# Patient Record
Sex: Male | Born: 1945
Health system: Southern US, Community
[De-identification: ages and names within clinical notes are randomized; demographics above are authoritative.]

## PROBLEM LIST (undated history)

## (undated) DIAGNOSIS — E7800 Pure hypercholesterolemia, unspecified: Secondary | ICD-10-CM

## (undated) DIAGNOSIS — F329 Major depressive disorder, single episode, unspecified: Secondary | ICD-10-CM

## (undated) DIAGNOSIS — F32A Depression, unspecified: Secondary | ICD-10-CM

## (undated) DIAGNOSIS — I509 Heart failure, unspecified: Secondary | ICD-10-CM

## (undated) DIAGNOSIS — E0500 Thyrotoxicosis with diffuse goiter without thyrotoxic crisis or storm: Secondary | ICD-10-CM

## (undated) DIAGNOSIS — C8310 Mantle cell lymphoma, unspecified site: Secondary | ICD-10-CM

## (undated) DIAGNOSIS — K3184 Gastroparesis: Secondary | ICD-10-CM

## (undated) DIAGNOSIS — G629 Polyneuropathy, unspecified: Secondary | ICD-10-CM

## (undated) DIAGNOSIS — B029 Zoster without complications: Secondary | ICD-10-CM

## (undated) DIAGNOSIS — E08311 Diabetes mellitus due to underlying condition with unspecified diabetic retinopathy with macular edema: Secondary | ICD-10-CM

## (undated) DIAGNOSIS — I10 Essential (primary) hypertension: Secondary | ICD-10-CM

## (undated) HISTORY — PX: NEPHRECTOMY: SHX65

## (undated) HISTORY — DX: Major depressive disorder, single episode, unspecified: F32.9

## (undated) HISTORY — PX: CHOLECYSTECTOMY: SHX55

## (undated) HISTORY — DX: Polyneuropathy, unspecified: G62.9

## (undated) HISTORY — DX: Mantle cell lymphoma, unspecified site: C83.10

## (undated) HISTORY — DX: Diabetes mellitus due to underlying condition with unspecified diabetic retinopathy with macular edema: E08.311

## (undated) HISTORY — DX: Zoster without complications: B02.9

## (undated) HISTORY — DX: Gastroparesis: K31.84

## (undated) HISTORY — DX: Depression, unspecified: F32.A

## (undated) HISTORY — DX: Heart failure, unspecified: I50.9

## (undated) HISTORY — DX: Pure hypercholesterolemia, unspecified: E78.00

## (undated) HISTORY — DX: Thyrotoxicosis with diffuse goiter without thyrotoxic crisis or storm: E05.00

## (undated) HISTORY — DX: Essential (primary) hypertension: I10

## (undated) MED FILL — Dexamethasone Sodium Phosphate Inj 100 MG/10ML: INTRAMUSCULAR | Qty: 1 | Status: AC

---

## 1999-10-13 DIAGNOSIS — K5900 Constipation, unspecified: Secondary | ICD-10-CM | POA: Insufficient documentation

## 1999-10-13 DIAGNOSIS — I2510 Atherosclerotic heart disease of native coronary artery without angina pectoris: Secondary | ICD-10-CM | POA: Insufficient documentation

## 1999-10-13 DIAGNOSIS — H04123 Dry eye syndrome of bilateral lacrimal glands: Secondary | ICD-10-CM | POA: Insufficient documentation

## 1999-10-13 DIAGNOSIS — F32A Depression, unspecified: Secondary | ICD-10-CM | POA: Insufficient documentation

## 1999-10-13 DIAGNOSIS — I872 Venous insufficiency (chronic) (peripheral): Secondary | ICD-10-CM | POA: Insufficient documentation

## 2004-05-29 ENCOUNTER — Ambulatory Visit (HOSPITAL_COMMUNITY): Admission: RE | Admit: 2004-05-29 | Discharge: 2004-05-29 | Payer: Self-pay | Admitting: Cardiovascular Disease

## 2004-09-10 ENCOUNTER — Ambulatory Visit: Payer: Self-pay | Admitting: Internal Medicine

## 2007-08-17 ENCOUNTER — Ambulatory Visit: Payer: Self-pay | Admitting: Unknown Physician Specialty

## 2011-11-20 DIAGNOSIS — E1129 Type 2 diabetes mellitus with other diabetic kidney complication: Secondary | ICD-10-CM | POA: Diagnosis not present

## 2011-11-20 DIAGNOSIS — E162 Hypoglycemia, unspecified: Secondary | ICD-10-CM | POA: Diagnosis not present

## 2011-11-20 DIAGNOSIS — E119 Type 2 diabetes mellitus without complications: Secondary | ICD-10-CM | POA: Diagnosis not present

## 2011-11-24 DIAGNOSIS — E0500 Thyrotoxicosis with diffuse goiter without thyrotoxic crisis or storm: Secondary | ICD-10-CM | POA: Diagnosis not present

## 2011-11-24 DIAGNOSIS — E162 Hypoglycemia, unspecified: Secondary | ICD-10-CM | POA: Diagnosis not present

## 2011-11-24 DIAGNOSIS — E109 Type 1 diabetes mellitus without complications: Secondary | ICD-10-CM | POA: Diagnosis not present

## 2011-12-25 DIAGNOSIS — I10 Essential (primary) hypertension: Secondary | ICD-10-CM | POA: Diagnosis not present

## 2011-12-25 DIAGNOSIS — E039 Hypothyroidism, unspecified: Secondary | ICD-10-CM | POA: Diagnosis not present

## 2011-12-25 DIAGNOSIS — E7800 Pure hypercholesterolemia, unspecified: Secondary | ICD-10-CM | POA: Diagnosis not present

## 2011-12-25 DIAGNOSIS — E119 Type 2 diabetes mellitus without complications: Secondary | ICD-10-CM | POA: Diagnosis not present

## 2012-02-12 DIAGNOSIS — Z79899 Other long term (current) drug therapy: Secondary | ICD-10-CM | POA: Diagnosis not present

## 2012-02-12 DIAGNOSIS — E109 Type 1 diabetes mellitus without complications: Secondary | ICD-10-CM | POA: Diagnosis not present

## 2012-02-12 DIAGNOSIS — Z125 Encounter for screening for malignant neoplasm of prostate: Secondary | ICD-10-CM | POA: Diagnosis not present

## 2012-02-12 DIAGNOSIS — E7800 Pure hypercholesterolemia, unspecified: Secondary | ICD-10-CM | POA: Diagnosis not present

## 2012-02-16 DIAGNOSIS — E0500 Thyrotoxicosis with diffuse goiter without thyrotoxic crisis or storm: Secondary | ICD-10-CM | POA: Diagnosis not present

## 2012-02-16 DIAGNOSIS — E162 Hypoglycemia, unspecified: Secondary | ICD-10-CM | POA: Diagnosis not present

## 2012-02-16 DIAGNOSIS — E7800 Pure hypercholesterolemia, unspecified: Secondary | ICD-10-CM | POA: Diagnosis not present

## 2012-02-16 DIAGNOSIS — E1065 Type 1 diabetes mellitus with hyperglycemia: Secondary | ICD-10-CM | POA: Diagnosis not present

## 2012-05-04 DIAGNOSIS — I10 Essential (primary) hypertension: Secondary | ICD-10-CM | POA: Diagnosis not present

## 2012-05-04 DIAGNOSIS — E7800 Pure hypercholesterolemia, unspecified: Secondary | ICD-10-CM | POA: Diagnosis not present

## 2012-05-04 DIAGNOSIS — E119 Type 2 diabetes mellitus without complications: Secondary | ICD-10-CM | POA: Diagnosis not present

## 2012-05-27 DIAGNOSIS — IMO0002 Reserved for concepts with insufficient information to code with codable children: Secondary | ICD-10-CM | POA: Diagnosis not present

## 2012-05-27 DIAGNOSIS — E1065 Type 1 diabetes mellitus with hyperglycemia: Secondary | ICD-10-CM | POA: Diagnosis not present

## 2012-05-27 DIAGNOSIS — E162 Hypoglycemia, unspecified: Secondary | ICD-10-CM | POA: Diagnosis not present

## 2012-05-31 DIAGNOSIS — E109 Type 1 diabetes mellitus without complications: Secondary | ICD-10-CM | POA: Diagnosis not present

## 2012-05-31 DIAGNOSIS — E0500 Thyrotoxicosis with diffuse goiter without thyrotoxic crisis or storm: Secondary | ICD-10-CM | POA: Diagnosis not present

## 2012-05-31 DIAGNOSIS — E162 Hypoglycemia, unspecified: Secondary | ICD-10-CM | POA: Diagnosis not present

## 2012-07-19 DIAGNOSIS — E109 Type 1 diabetes mellitus without complications: Secondary | ICD-10-CM | POA: Diagnosis not present

## 2012-09-02 DIAGNOSIS — E0500 Thyrotoxicosis with diffuse goiter without thyrotoxic crisis or storm: Secondary | ICD-10-CM | POA: Diagnosis not present

## 2012-09-02 DIAGNOSIS — E109 Type 1 diabetes mellitus without complications: Secondary | ICD-10-CM | POA: Diagnosis not present

## 2012-09-06 DIAGNOSIS — E109 Type 1 diabetes mellitus without complications: Secondary | ICD-10-CM | POA: Diagnosis not present

## 2012-09-06 DIAGNOSIS — E0500 Thyrotoxicosis with diffuse goiter without thyrotoxic crisis or storm: Secondary | ICD-10-CM | POA: Diagnosis not present

## 2012-09-06 DIAGNOSIS — E162 Hypoglycemia, unspecified: Secondary | ICD-10-CM | POA: Diagnosis not present

## 2012-12-06 DIAGNOSIS — E0500 Thyrotoxicosis with diffuse goiter without thyrotoxic crisis or storm: Secondary | ICD-10-CM | POA: Diagnosis not present

## 2012-12-06 DIAGNOSIS — E109 Type 1 diabetes mellitus without complications: Secondary | ICD-10-CM | POA: Diagnosis not present

## 2012-12-13 DIAGNOSIS — E109 Type 1 diabetes mellitus without complications: Secondary | ICD-10-CM | POA: Diagnosis not present

## 2012-12-13 DIAGNOSIS — E0500 Thyrotoxicosis with diffuse goiter without thyrotoxic crisis or storm: Secondary | ICD-10-CM | POA: Diagnosis not present

## 2013-03-14 DIAGNOSIS — E119 Type 2 diabetes mellitus without complications: Secondary | ICD-10-CM | POA: Diagnosis not present

## 2013-03-14 DIAGNOSIS — E162 Hypoglycemia, unspecified: Secondary | ICD-10-CM | POA: Diagnosis not present

## 2013-03-21 DIAGNOSIS — E7800 Pure hypercholesterolemia, unspecified: Secondary | ICD-10-CM | POA: Diagnosis not present

## 2013-03-21 DIAGNOSIS — E0500 Thyrotoxicosis with diffuse goiter without thyrotoxic crisis or storm: Secondary | ICD-10-CM | POA: Diagnosis not present

## 2013-03-21 DIAGNOSIS — E162 Hypoglycemia, unspecified: Secondary | ICD-10-CM | POA: Diagnosis not present

## 2013-03-21 DIAGNOSIS — E109 Type 1 diabetes mellitus without complications: Secondary | ICD-10-CM | POA: Diagnosis not present

## 2013-04-12 ENCOUNTER — Encounter: Payer: Self-pay | Admitting: Internal Medicine

## 2013-04-12 ENCOUNTER — Ambulatory Visit (INDEPENDENT_AMBULATORY_CARE_PROVIDER_SITE_OTHER): Payer: Medicare Other | Admitting: Internal Medicine

## 2013-04-12 ENCOUNTER — Other Ambulatory Visit: Payer: Self-pay | Admitting: *Deleted

## 2013-04-12 VITALS — BP 130/60 | HR 90 | Temp 98.2°F | Ht 70.25 in | Wt 223.8 lb

## 2013-04-12 DIAGNOSIS — I10 Essential (primary) hypertension: Secondary | ICD-10-CM

## 2013-04-12 DIAGNOSIS — E079 Disorder of thyroid, unspecified: Secondary | ICD-10-CM | POA: Diagnosis not present

## 2013-04-12 DIAGNOSIS — R079 Chest pain, unspecified: Secondary | ICD-10-CM

## 2013-04-12 DIAGNOSIS — R109 Unspecified abdominal pain: Secondary | ICD-10-CM

## 2013-04-12 DIAGNOSIS — E119 Type 2 diabetes mellitus without complications: Secondary | ICD-10-CM | POA: Diagnosis not present

## 2013-04-12 DIAGNOSIS — E7800 Pure hypercholesterolemia, unspecified: Secondary | ICD-10-CM

## 2013-04-12 MED ORDER — PRAVASTATIN SODIUM 40 MG PO TABS: 40.0000 mg | ORAL_TABLET | Freq: Every day | ORAL | Status: DC

## 2013-04-12 MED ORDER — PANTOPRAZOLE SODIUM 40 MG PO TBEC: 40.0000 mg | DELAYED_RELEASE_TABLET | Freq: Every day | ORAL | Status: DC

## 2013-04-12 MED ORDER — INSULIN ASPART 100 UNIT/ML ~~LOC~~ SOLN: SUBCUTANEOUS | Status: DC

## 2013-04-12 MED ORDER — METOCLOPRAMIDE HCL 10 MG PO TABS: 10.0000 mg | ORAL_TABLET | Freq: Every day | ORAL | Status: DC

## 2013-04-12 MED ORDER — LISINOPRIL 20 MG PO TABS: 20.0000 mg | ORAL_TABLET | Freq: Every day | ORAL | Status: DC

## 2013-04-13 ENCOUNTER — Encounter: Payer: Self-pay | Admitting: Internal Medicine

## 2013-04-13 DIAGNOSIS — R079 Chest pain, unspecified: Secondary | ICD-10-CM | POA: Insufficient documentation

## 2013-04-13 DIAGNOSIS — E7800 Pure hypercholesterolemia, unspecified: Secondary | ICD-10-CM | POA: Insufficient documentation

## 2013-04-13 DIAGNOSIS — E119 Type 2 diabetes mellitus without complications: Secondary | ICD-10-CM | POA: Insufficient documentation

## 2013-04-13 DIAGNOSIS — I10 Essential (primary) hypertension: Secondary | ICD-10-CM | POA: Insufficient documentation

## 2013-04-13 DIAGNOSIS — E079 Disorder of thyroid, unspecified: Secondary | ICD-10-CM | POA: Insufficient documentation

## 2013-04-13 NOTE — Progress Notes (Signed)
  Subjective:    [name], male    DOB: [birth year], 67 y.o.   MRN: [id number]  HPI 67 year old male with past history of diabetes, hypertension, hypercholesterolemia and thyroid disease.  He is followed by Dr Eddie Dibbles for his diabetes and thyroid issues.  He now has an insulin pump.  Not having as much problems with lows.  He dose report that starting approximately one week ago he developed stomach, chest and back pain.  Decreased appetite through this episode.  He reports he had been out of Reglan for 6 months.  He feels this is what caused the flare.  During the time off the medication, he had no problems swallowing, nausea or bowel change.  With this flare, did notice increased acid/burning and discomfort in his chest.  Was not evaluated.  Reports significant pain.  States he found some reglan at home and started taking the medication several days ago.  Pain has essentially subsided now.  No increased cough or congestion.    Past Medical History  Diagnosis Date  . Thyroid disease   . Hypertension   . Diabetes   . Hypercholesterolemia     Outpatient Encounter Prescriptions as of 04/12/2013  Medication Sig Dispense Refill  . methimazole (TAPAZOLE) 10 MG tablet Take 20 mg by mouth 2 (two) times daily.      . pantoprazole (PROTONIX) 40 MG tablet Take 1 tablet (40 mg total) by mouth daily.  30 tablet  3  . [DISCONTINUED] Insulin Aspart (NOVOLOG [place]) Inject into the skin. Pump      . [DISCONTINUED] lisinopril (PRINIVIL,ZESTRIL) 20 MG tablet Take 20 mg by mouth at bedtime.      . [DISCONTINUED] metoCLOPramide (REGLAN) 10 MG tablet Take 10 mg by mouth at bedtime.      . [DISCONTINUED] pravastatin (PRAVACHOL) 40 MG tablet Take 40 mg by mouth at bedtime.       No facility-administered encounter medications on file as of 04/12/2013.    Review of Systems Patient denies any headache, lightheadedness or dizziness.  No sinus or allergy symptoms.  No palpitations.  Does report the chest  discomfort associated with abdominal pain and back pain as outlined.  No increased shortness of breath, cough or congestion.  No vomiting.  Does report decreased appetite.  No BRBPR or melana.  Does report some constipation.  No urine change.        Objective:   Physical Exam Filed Vitals:   04/12/13 1326  BP: 130/60  Pulse: 90  Temp: 98.2 F (36.8 C)   Blood pressure recheck:  120/68, pulse 43  67 year old male in no acute distress.  HEENT:  Nares - clear.  Oropharynx - without lesions. NECK:  Supple.  Nontender.  No audible carotid bruit.  HEART:  Appears to be regular.   LUNGS:  No crackles or wheezing audible.  Respirations even and unlabored.   RADIAL PULSE:  Equal bilaterally.  ABDOMEN:  Soft.  Nontender.  Bowel sounds present and normal.  No audible abdominal bruit.   EXTREMITIES:  No increased edema present.  DP pulses palpable and equal bilaterally.      Assessment & Plan:  HEALTH MAINTENANCE.  Schedule a physical next visit.  Obtain outside records.

## 2013-04-13 NOTE — Assessment & Plan Note (Signed)
Chest pain as outlined.  EKG obtained and revealed SR with RBBB with TWI in III and aVF.  Given his risk factors, I do feel he warrants further cardiac evaluation.  Will refer back to Dr Nehemiah Massed for evaluation and question of need for further w/up.  Will also start him on Protonix and obtain an abdominal ultrasound.  Will hold Reglan for now.  Follow closely.

## 2013-04-13 NOTE — Assessment & Plan Note (Signed)
On pravastatin.  Follow lipid panel and liver function.

## 2013-04-13 NOTE — Assessment & Plan Note (Signed)
Blood pressure doing well.  Continue current medication regimen.  Follow metabolic panel.

## 2013-04-13 NOTE — Assessment & Plan Note (Signed)
Has an insulin pump now.  Sees Dr Eddie Dibbles.  States does not have significant problems with lows now.  Did have a sugar of 49 here today.  Drank a soft drink.  Blood sugar increased to 100.  He was also given some juice and crackers.  Felt better.  Obtain records.

## 2013-04-13 NOTE — Assessment & Plan Note (Signed)
Followed by Dr Eddie Dibbles.  Obtain records.  On Tapazole.

## 2013-04-17 ENCOUNTER — Ambulatory Visit: Payer: Self-pay | Admitting: Internal Medicine

## 2013-04-17 DIAGNOSIS — R109 Unspecified abdominal pain: Secondary | ICD-10-CM | POA: Diagnosis not present

## 2013-04-18 ENCOUNTER — Encounter: Payer: Self-pay | Admitting: Internal Medicine

## 2013-04-18 DIAGNOSIS — I2510 Atherosclerotic heart disease of native coronary artery without angina pectoris: Secondary | ICD-10-CM | POA: Diagnosis not present

## 2013-04-18 DIAGNOSIS — R1084 Generalized abdominal pain: Secondary | ICD-10-CM | POA: Diagnosis not present

## 2013-04-18 DIAGNOSIS — R079 Chest pain, unspecified: Secondary | ICD-10-CM | POA: Diagnosis not present

## 2013-04-18 DIAGNOSIS — I10 Essential (primary) hypertension: Secondary | ICD-10-CM | POA: Diagnosis not present

## 2013-04-20 ENCOUNTER — Telehealth: Payer: Self-pay | Admitting: *Deleted

## 2013-04-20 NOTE — Telephone Encounter (Signed)
Notified pt that his abdominal ultrasound from 04/17/13 revealed no acute abnormalities. Some changes c/w fatty liver. Pt reported that she has seen her cardiologist & has a GI appt on 8/14 (report faxed to [facility]).

## 2013-04-28 ENCOUNTER — Encounter: Payer: Self-pay | Admitting: Internal Medicine

## 2013-05-08 DIAGNOSIS — I2510 Atherosclerotic heart disease of native coronary artery without angina pectoris: Secondary | ICD-10-CM | POA: Diagnosis not present

## 2013-05-08 DIAGNOSIS — E119 Type 2 diabetes mellitus without complications: Secondary | ICD-10-CM | POA: Diagnosis not present

## 2013-05-08 DIAGNOSIS — E782 Mixed hyperlipidemia: Secondary | ICD-10-CM | POA: Diagnosis not present

## 2013-05-08 DIAGNOSIS — I119 Hypertensive heart disease without heart failure: Secondary | ICD-10-CM | POA: Diagnosis not present

## 2013-05-17 ENCOUNTER — Encounter: Payer: Self-pay | Admitting: *Deleted

## 2013-05-29 DIAGNOSIS — K219 Gastro-esophageal reflux disease without esophagitis: Secondary | ICD-10-CM | POA: Diagnosis not present

## 2013-05-29 DIAGNOSIS — Z86010 Personal history of colonic polyps: Secondary | ICD-10-CM | POA: Diagnosis not present

## 2013-05-29 DIAGNOSIS — K3184 Gastroparesis: Secondary | ICD-10-CM | POA: Diagnosis not present

## 2013-05-31 ENCOUNTER — Encounter: Payer: Self-pay | Admitting: *Deleted

## 2013-06-20 ENCOUNTER — Ambulatory Visit (INDEPENDENT_AMBULATORY_CARE_PROVIDER_SITE_OTHER): Payer: Medicare Other | Admitting: Internal Medicine

## 2013-06-20 ENCOUNTER — Encounter: Payer: Self-pay | Admitting: Internal Medicine

## 2013-06-20 VITALS — BP 110/80 | HR 71 | Temp 98.4°F | Ht 70.25 in | Wt 238.5 lb

## 2013-06-20 DIAGNOSIS — E7800 Pure hypercholesterolemia, unspecified: Secondary | ICD-10-CM

## 2013-06-20 DIAGNOSIS — R079 Chest pain, unspecified: Secondary | ICD-10-CM

## 2013-06-20 DIAGNOSIS — I10 Essential (primary) hypertension: Secondary | ICD-10-CM

## 2013-06-20 DIAGNOSIS — E119 Type 2 diabetes mellitus without complications: Secondary | ICD-10-CM

## 2013-06-20 DIAGNOSIS — Z23 Encounter for immunization: Secondary | ICD-10-CM | POA: Diagnosis not present

## 2013-06-20 DIAGNOSIS — K3184 Gastroparesis: Secondary | ICD-10-CM

## 2013-06-20 DIAGNOSIS — E079 Disorder of thyroid, unspecified: Secondary | ICD-10-CM

## 2013-06-25 ENCOUNTER — Encounter: Payer: Self-pay | Admitting: Internal Medicine

## 2013-06-25 DIAGNOSIS — K3184 Gastroparesis: Secondary | ICD-10-CM | POA: Insufficient documentation

## 2013-06-25 MED ORDER — LISINOPRIL 20 MG PO TABS: 20.0000 mg | ORAL_TABLET | Freq: Every day | ORAL | Status: DC

## 2013-06-25 MED ORDER — PANTOPRAZOLE SODIUM 40 MG PO TBEC: 40.0000 mg | DELAYED_RELEASE_TABLET | Freq: Every day | ORAL | Status: DC

## 2013-06-25 MED ORDER — PRAVASTATIN SODIUM 40 MG PO TABS: 40.0000 mg | ORAL_TABLET | Freq: Every day | ORAL | Status: DC

## 2013-06-25 MED ORDER — METOCLOPRAMIDE HCL 10 MG PO TABS: 10.0000 mg | ORAL_TABLET | Freq: Every day | ORAL | Status: DC

## 2013-06-25 NOTE — Assessment & Plan Note (Signed)
Followed by Dr Eddie Dibbles.  On Tapazole.

## 2013-06-25 NOTE — Progress Notes (Signed)
Subjective:    [name], male    DOB: 08-Dec-1945, 67 y.o.   MRN: [id number]  HPI 67 year old male with past history of diabetes, hypertension, hypercholesterolemia and thyroid disease.  He is followed by Dr Eddie Dibbles for his diabetes and thyroid issues.  He now has an insulin pump.  Not having as much problems with lows.  He comes in today to follow up on these issues as well as for a complete physical exam.  He states he is doing relatively well.  No chest pain or tightness.  Saw cardiology recently.  States everything checked out fine.  Also saw GI.  They had wanted to repeat a colonoscopy and an EGD.  He declined at that time.  They recommend him staying on his current regimen.  Since being on the reglan and protonix, he has done well.  No further pain.  Breathing stable. No acid reflux.  Bowels stable.  Overall doing relatively well.      Past Medical History  Diagnosis Date  . Peripheral neuropathy   . Hypertension   . 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 Hypercholesterolemia   . Gastroparesis   . Graves disease   . Depression     Outpatient Encounter Prescriptions as of 06/20/2013  Medication Sig Dispense Refill  . insulin aspart (NOVOLOG) 100 UNIT/ML injection Use as directed  1 vial  12  . lisinopril (PRINIVIL,ZESTRIL) 20 MG tablet Take 1 tablet (20 mg total) by mouth at bedtime.  30 tablet  5  . methimazole (TAPAZOLE) 10 MG tablet Take 20 mg by mouth 2 (two) times daily.      . metoCLOPramide (REGLAN) 10 MG tablet Take 1 tablet (10 mg total) by mouth at bedtime.  30 tablet  5  . pantoprazole (PROTONIX) 40 MG tablet Take 1 tablet (40 mg total) by mouth daily.  30 tablet  3  . pravastatin (PRAVACHOL) 40 MG tablet Take 1 tablet (40 mg total) by mouth at bedtime.  30 tablet  5   No facility-administered encounter medications on file as of 06/20/2013.    Review of Systems Patient denies any headache, lightheadedness  or dizziness.  No sinus or allergy symptoms.  No palpitations.  No chest pain or tightness.  No increased shortness of breath, cough or congestion.  No vomiting.  No acid reflux.   No BRBPR or melana.  No bowel change.   No urine change.  Not having problems with lows (as he was previously).  Overall he feels things are stable and feels better than on previous visit.       Objective:   Physical Exam  Filed Vitals:   06/20/13 1554  BP: 110/80  Pulse: 71  Temp: 98.4 F (36.9 C)   Blood pressure recheck:  122/72, pulse 45  67 year old male in no acute distress.  HEENT:  Nares - clear.  Oropharynx - without lesions. NECK:  Supple.  Nontender.  No audible carotid bruit.  HEART:  Appears to be regular.   LUNGS:  No crackles or wheezing audible.  Respirations even and unlabored.   RADIAL PULSE:  Equal bilaterally.  ABDOMEN:  Soft.  Nontender.  Bowel sounds present and normal.  No audible abdominal bruit.  GU:  Normal descended testicles.  No palpable testicular nodules.   RECTAL:  Could not appreciate any palpable prostate nodules.  Heme negative.   EXTREMITIES:  No increased edema present.  DP pulses palpable and equal bilaterally.     FEET:  Without lesions.       Assessment & Plan:  HEALTH MAINTENANCE.  Physical today.  Saw GI.  Planning to for colonoscopy and EGD in the future.  He declines to have done now.  They are planning to follow up with him in the near future.

## 2013-06-25 NOTE — Assessment & Plan Note (Signed)
Saw cardiology.  Felt things were stable.  No further chest pain since being on reglan and protonix.  Doing well.  Follow.

## 2013-06-25 NOTE — Assessment & Plan Note (Signed)
On pravastatin.  Follow lipid panel and liver function.

## 2013-06-25 NOTE — Assessment & Plan Note (Signed)
Has an insulin pump now.  Sees Dr Eddie Dibbles.  States does not have significant problems with lows now.  Doing better.

## 2013-06-25 NOTE — Assessment & Plan Note (Signed)
On reglan and protonix now.  Currently asymptomatic.  Follow.

## 2013-06-25 NOTE — Assessment & Plan Note (Signed)
Blood pressure doing well.  Continue current medication regimen.  Follow metabolic panel.

## 2013-06-30 DIAGNOSIS — H25019 Cortical age-related cataract, unspecified eye: Secondary | ICD-10-CM | POA: Diagnosis not present

## 2013-06-30 DIAGNOSIS — H2510 Age-related nuclear cataract, unspecified eye: Secondary | ICD-10-CM | POA: Diagnosis not present

## 2013-06-30 DIAGNOSIS — E119 Type 2 diabetes mellitus without complications: Secondary | ICD-10-CM | POA: Diagnosis not present

## 2013-07-04 DIAGNOSIS — H2510 Age-related nuclear cataract, unspecified eye: Secondary | ICD-10-CM | POA: Diagnosis not present

## 2013-07-26 DIAGNOSIS — H25019 Cortical age-related cataract, unspecified eye: Secondary | ICD-10-CM | POA: Diagnosis not present

## 2013-08-07 ENCOUNTER — Ambulatory Visit: Payer: Self-pay | Admitting: Ophthalmology

## 2013-08-07 DIAGNOSIS — E119 Type 2 diabetes mellitus without complications: Secondary | ICD-10-CM | POA: Diagnosis not present

## 2013-08-07 DIAGNOSIS — I10 Essential (primary) hypertension: Secondary | ICD-10-CM | POA: Diagnosis not present

## 2013-08-07 DIAGNOSIS — H25019 Cortical age-related cataract, unspecified eye: Secondary | ICD-10-CM | POA: Diagnosis not present

## 2013-08-07 DIAGNOSIS — Z905 Acquired absence of kidney: Secondary | ICD-10-CM | POA: Diagnosis not present

## 2013-08-07 DIAGNOSIS — E079 Disorder of thyroid, unspecified: Secondary | ICD-10-CM | POA: Diagnosis not present

## 2013-08-07 DIAGNOSIS — Z79899 Other long term (current) drug therapy: Secondary | ICD-10-CM | POA: Diagnosis not present

## 2013-08-07 DIAGNOSIS — K219 Gastro-esophageal reflux disease without esophagitis: Secondary | ICD-10-CM | POA: Diagnosis not present

## 2013-08-07 DIAGNOSIS — Z87891 Personal history of nicotine dependence: Secondary | ICD-10-CM | POA: Diagnosis not present

## 2013-08-07 DIAGNOSIS — H269 Unspecified cataract: Secondary | ICD-10-CM | POA: Diagnosis not present

## 2013-08-07 DIAGNOSIS — F329 Major depressive disorder, single episode, unspecified: Secondary | ICD-10-CM | POA: Diagnosis not present

## 2013-08-07 DIAGNOSIS — E7800 Pure hypercholesterolemia, unspecified: Secondary | ICD-10-CM | POA: Diagnosis not present

## 2013-08-07 DIAGNOSIS — Z794 Long term (current) use of insulin: Secondary | ICD-10-CM | POA: Diagnosis not present

## 2013-08-28 DIAGNOSIS — E119 Type 2 diabetes mellitus without complications: Secondary | ICD-10-CM | POA: Diagnosis not present

## 2013-08-28 DIAGNOSIS — E785 Hyperlipidemia, unspecified: Secondary | ICD-10-CM | POA: Diagnosis not present

## 2013-08-28 DIAGNOSIS — I10 Essential (primary) hypertension: Secondary | ICD-10-CM | POA: Diagnosis not present

## 2013-08-28 DIAGNOSIS — I2510 Atherosclerotic heart disease of native coronary artery without angina pectoris: Secondary | ICD-10-CM | POA: Diagnosis not present

## 2013-09-05 DIAGNOSIS — H2510 Age-related nuclear cataract, unspecified eye: Secondary | ICD-10-CM | POA: Diagnosis not present

## 2013-09-15 ENCOUNTER — Emergency Department: Payer: Self-pay | Admitting: Emergency Medicine

## 2013-09-15 DIAGNOSIS — R079 Chest pain, unspecified: Secondary | ICD-10-CM | POA: Diagnosis not present

## 2013-09-15 DIAGNOSIS — E109 Type 1 diabetes mellitus without complications: Secondary | ICD-10-CM | POA: Diagnosis not present

## 2013-09-15 DIAGNOSIS — Z794 Long term (current) use of insulin: Secondary | ICD-10-CM | POA: Diagnosis not present

## 2013-09-15 DIAGNOSIS — E785 Hyperlipidemia, unspecified: Secondary | ICD-10-CM | POA: Diagnosis not present

## 2013-09-15 DIAGNOSIS — E039 Hypothyroidism, unspecified: Secondary | ICD-10-CM | POA: Diagnosis not present

## 2013-09-15 DIAGNOSIS — H4310 Vitreous hemorrhage, unspecified eye: Secondary | ICD-10-CM | POA: Diagnosis not present

## 2013-09-15 DIAGNOSIS — I10 Essential (primary) hypertension: Secondary | ICD-10-CM | POA: Diagnosis not present

## 2013-09-15 DIAGNOSIS — R0602 Shortness of breath: Secondary | ICD-10-CM | POA: Diagnosis not present

## 2013-09-15 LAB — CBC
HCT: 46.3 % (ref 40.0–52.0)
MCV: 90 fL (ref 80–100)
Platelet: 177 10*3/uL (ref 150–440)
RBC: 5.15 10*6/uL (ref 4.40–5.90)
WBC: 9.3 10*3/uL (ref 3.8–10.6)

## 2013-09-15 LAB — BASIC METABOLIC PANEL
Anion Gap: 4 — ABNORMAL LOW (ref 7–16)
BUN: 7 mg/dL (ref 7–18)
Calcium, Total: 8.4 mg/dL — ABNORMAL LOW (ref 8.5–10.1)
Creatinine: 1.04 mg/dL (ref 0.60–1.30)
EGFR (Non-African Amer.): >60
Glucose: 293 mg/dL — ABNORMAL HIGH (ref 65–99)
Osmolality: 288 (ref 275–301)
Potassium: 3.7 mmol/L (ref 3.5–5.1)
Sodium: 140 mmol/L (ref 136–145)

## 2013-09-15 LAB — PROTIME-INR: INR: 1

## 2013-09-15 LAB — TROPONIN I
Troponin-I: <0.02 ng/mL
Troponin-I: <0.02 ng/mL

## 2013-09-15 LAB — CK TOTAL AND CKMB (NOT AT ARMC): CK, Total: 106 U/L (ref 35–232)

## 2013-09-18 ENCOUNTER — Ambulatory Visit: Payer: Self-pay | Admitting: Ophthalmology

## 2013-09-18 DIAGNOSIS — H2510 Age-related nuclear cataract, unspecified eye: Secondary | ICD-10-CM | POA: Diagnosis not present

## 2013-09-18 DIAGNOSIS — I10 Essential (primary) hypertension: Secondary | ICD-10-CM | POA: Diagnosis not present

## 2013-09-18 DIAGNOSIS — Z905 Acquired absence of kidney: Secondary | ICD-10-CM | POA: Diagnosis not present

## 2013-09-18 DIAGNOSIS — E7800 Pure hypercholesterolemia, unspecified: Secondary | ICD-10-CM | POA: Diagnosis not present

## 2013-09-18 DIAGNOSIS — F329 Major depressive disorder, single episode, unspecified: Secondary | ICD-10-CM | POA: Diagnosis not present

## 2013-09-18 DIAGNOSIS — E119 Type 2 diabetes mellitus without complications: Secondary | ICD-10-CM | POA: Diagnosis not present

## 2013-09-18 DIAGNOSIS — K219 Gastro-esophageal reflux disease without esophagitis: Secondary | ICD-10-CM | POA: Diagnosis not present

## 2013-09-18 DIAGNOSIS — Z79899 Other long term (current) drug therapy: Secondary | ICD-10-CM | POA: Diagnosis not present

## 2013-09-18 DIAGNOSIS — H269 Unspecified cataract: Secondary | ICD-10-CM | POA: Diagnosis not present

## 2013-09-18 DIAGNOSIS — Z87891 Personal history of nicotine dependence: Secondary | ICD-10-CM | POA: Diagnosis not present

## 2013-09-18 DIAGNOSIS — Z794 Long term (current) use of insulin: Secondary | ICD-10-CM | POA: Diagnosis not present

## 2013-09-19 DIAGNOSIS — E109 Type 1 diabetes mellitus without complications: Secondary | ICD-10-CM | POA: Diagnosis not present

## 2013-09-19 DIAGNOSIS — E7800 Pure hypercholesterolemia, unspecified: Secondary | ICD-10-CM | POA: Diagnosis not present

## 2013-09-19 LAB — HEMOGLOBIN A1C: Hgb A1c MFr Bld: 6 % (ref 4.0–6.0)

## 2013-09-26 DIAGNOSIS — E7800 Pure hypercholesterolemia, unspecified: Secondary | ICD-10-CM | POA: Diagnosis not present

## 2013-09-26 DIAGNOSIS — E109 Type 1 diabetes mellitus without complications: Secondary | ICD-10-CM | POA: Diagnosis not present

## 2013-09-26 DIAGNOSIS — E0500 Thyrotoxicosis with diffuse goiter without thyrotoxic crisis or storm: Secondary | ICD-10-CM | POA: Diagnosis not present

## 2013-10-13 ENCOUNTER — Encounter (INDEPENDENT_AMBULATORY_CARE_PROVIDER_SITE_OTHER): Payer: Self-pay

## 2013-10-13 ENCOUNTER — Other Ambulatory Visit (INDEPENDENT_AMBULATORY_CARE_PROVIDER_SITE_OTHER): Payer: Medicare Other

## 2013-10-13 ENCOUNTER — Other Ambulatory Visit: Payer: Self-pay | Admitting: Internal Medicine

## 2013-10-13 ENCOUNTER — Telehealth: Payer: Self-pay | Admitting: Internal Medicine

## 2013-10-13 DIAGNOSIS — E079 Disorder of thyroid, unspecified: Secondary | ICD-10-CM

## 2013-10-13 DIAGNOSIS — E7800 Pure hypercholesterolemia, unspecified: Secondary | ICD-10-CM

## 2013-10-13 DIAGNOSIS — E119 Type 2 diabetes mellitus without complications: Secondary | ICD-10-CM

## 2013-10-13 LAB — CBC WITH DIFFERENTIAL/PLATELET
Basophils Absolute: 0.1 10*3/uL (ref 0.0–0.1)
Basophils Relative: 0.9 % (ref 0.0–3.0)
Eosinophils Absolute: 0.2 10*3/uL (ref 0.0–0.7)
Eosinophils Relative: 3.5 % (ref 0.0–5.0)
HCT: 44.5 % (ref 39.0–52.0)
HEMOGLOBIN: 15.4 g/dL (ref 13.0–17.0)
LYMPHS ABS: 1.7 10*3/uL (ref 0.7–4.0)
LYMPHS PCT: 26.9 % (ref 12.0–46.0)
MCHC: 34.5 g/dL (ref 30.0–36.0)
MCV: 89.4 fl (ref 78.0–100.0)
MONOS PCT: 8.2 % (ref 3.0–12.0)
Monocytes Absolute: 0.5 10*3/uL (ref 0.1–1.0)
NEUTROS PCT: 60.5 % (ref 43.0–77.0)
Neutro Abs: 3.8 10*3/uL (ref 1.4–7.7)
PLATELETS: 197 10*3/uL (ref 150.0–400.0)
RBC: 4.98 Mil/uL (ref 4.22–5.81)
RDW: 13.4 % (ref 11.5–14.6)
WBC: 6.3 10*3/uL (ref 4.5–10.5)

## 2013-10-13 LAB — BASIC METABOLIC PANEL
BUN: 9 mg/dL (ref 6–23)
CALCIUM: 8.1 mg/dL — AB (ref 8.4–10.5)
CHLORIDE: 106 meq/L (ref 96–112)
CO2: 31 meq/L (ref 19–32)
CREATININE: 1 mg/dL (ref 0.4–1.5)
GFR: 84.5 mL/min (ref >60.00)
Glucose, Bld: 138 mg/dL — ABNORMAL HIGH (ref 70–99)
Potassium: 4 mEq/L (ref 3.5–5.1)
SODIUM: 142 meq/L (ref 135–145)

## 2013-10-13 LAB — LIPID PANEL
Cholesterol: 140 mg/dL (ref 0–200)
HDL: 51.1 mg/dL (ref >39.00)
LDL Cholesterol: 81 mg/dL (ref 0–99)
Total CHOL/HDL Ratio: 3
Triglycerides: 40 mg/dL (ref 0.0–149.0)
VLDL: 8 mg/dL (ref 0.0–40.0)

## 2013-10-13 LAB — HEPATIC FUNCTION PANEL
ALBUMIN: 3.6 g/dL (ref 3.5–5.2)
ALK PHOS: 87 U/L (ref 39–117)
ALT: 15 U/L (ref 0–53)
AST: 19 U/L (ref 0–37)
BILIRUBIN DIRECT: 0.1 mg/dL (ref 0.0–0.3)
Total Bilirubin: 1.2 mg/dL (ref 0.3–1.2)
Total Protein: 6.5 g/dL (ref 6.0–8.3)

## 2013-10-13 LAB — MICROALBUMIN / CREATININE URINE RATIO
CREATININE, U: 220.4 mg/dL
MICROALB/CREAT RATIO: 0.3 mg/g (ref 0.0–30.0)
Microalb, Ur: 0.6 mg/dL (ref 0.0–1.9)

## 2013-10-13 NOTE — Telephone Encounter (Signed)
Okay to refill? Please advise.  

## 2013-10-13 NOTE — Telephone Encounter (Signed)
If he feel still needs to take, I am ok to refill x 1.  Need ER records and he needs to f/u with GI if persistent issues with his stomach.

## 2013-10-13 NOTE — Telephone Encounter (Signed)
Pt came in today for labs and wanted to get a refill on sucralfate 1 gm tablet  Take 1 tablet by mouth 4 times a day before meals and at bedtime for 30 days.  Dr Ramon Dredge williams prescribed this when he went [facility] [address]

## 2013-10-13 NOTE — Telephone Encounter (Signed)
Refilled x 1 & pt informed. ER records requested also

## 2013-10-20 ENCOUNTER — Ambulatory Visit (INDEPENDENT_AMBULATORY_CARE_PROVIDER_SITE_OTHER): Payer: Medicare Other | Admitting: Internal Medicine

## 2013-10-20 ENCOUNTER — Encounter: Payer: Self-pay | Admitting: Internal Medicine

## 2013-10-20 VITALS — BP 122/62 | HR 93 | Temp 98.0°F | Ht 70.25 in | Wt 251.0 lb

## 2013-10-20 DIAGNOSIS — E079 Disorder of thyroid, unspecified: Secondary | ICD-10-CM

## 2013-10-20 DIAGNOSIS — K219 Gastro-esophageal reflux disease without esophagitis: Secondary | ICD-10-CM | POA: Diagnosis not present

## 2013-10-20 DIAGNOSIS — I10 Essential (primary) hypertension: Secondary | ICD-10-CM

## 2013-10-20 DIAGNOSIS — E7800 Pure hypercholesterolemia, unspecified: Secondary | ICD-10-CM

## 2013-10-20 DIAGNOSIS — R079 Chest pain, unspecified: Secondary | ICD-10-CM | POA: Diagnosis not present

## 2013-10-20 DIAGNOSIS — I499 Cardiac arrhythmia, unspecified: Secondary | ICD-10-CM

## 2013-10-20 DIAGNOSIS — K3184 Gastroparesis: Secondary | ICD-10-CM

## 2013-10-20 DIAGNOSIS — E119 Type 2 diabetes mellitus without complications: Secondary | ICD-10-CM

## 2013-10-20 NOTE — Progress Notes (Signed)
Pre-visit discussion using our clinic review tool. No additional management support is needed unless otherwise documented below in the visit note.  

## 2013-10-20 NOTE — Progress Notes (Signed)
Subjective:    [name], male    DOB: December 26, 1945, 68 y.o.   MRN: [id number]  HPI 68 year old male with past history of diabetes, hypertension, hypercholesterolemia and thyroid disease.  He is followed by Dr Eddie Dibbles for his diabetes and thyroid issues.  He now has an insulin pump.  Not having as much problems with lows.  He comes in today for a scheduled follow up/hospital f/u.   He states he is doing relatively well now.  No chest pain or tightness now.  Was seen on 09/15/13 in the ER for chest pain.  Labs, cxr and EKG obtained.  Felt related to a GI origin.  Was placed on carafate.   Saw cardiology recently.  States everything checked out fine.  Symptoms better.  Breathing stable.  Bowels stable.  Overall doing relatively well.  States symptoms aggravated by certain foods (i.e., [facility]).      Past Medical History  Diagnosis Date  . Peripheral neuropathy   . Hypertension   . 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 Hypercholesterolemia   . Gastroparesis   . Graves disease   . Depression     Outpatient Encounter Prescriptions as of 10/20/2013  Medication Sig  . insulin aspart (NOVOLOG) 100 UNIT/ML injection Use as directed  . lisinopril (PRINIVIL,ZESTRIL) 20 MG tablet Take 1 tablet (20 mg total) by mouth at bedtime.  . methimazole (TAPAZOLE) 10 MG tablet Take 20 mg by mouth 2 (two) times daily.  . metoCLOPramide (REGLAN) 10 MG tablet Take 1 tablet (10 mg total) by mouth at bedtime.  . pantoprazole (PROTONIX) 40 MG tablet Take 1 tablet (40 mg total) by mouth daily.  . pravastatin (PRAVACHOL) 40 MG tablet Take 1 tablet (40 mg total) by mouth at bedtime.  . sucralfate (CARAFATE) 1 G tablet TAKE 1 TABLET BY MOUTH 4 TIMES A DAY BEFORE MEALS AND AT BEDTIME FOR 30 DAYS    Review of Systems Patient denies any headache, lightheadedness or dizziness.  No sinus or allergy symptoms.  No palpitations.  No chest pain or tightness  now.  Was seen in ER 09/15/13 as outlined.  See ER notes for details.   No increased shortness of breath, cough or congestion.  No vomiting.  No BRBPR or melana.  No bowel change.   No urine change.  Seeing Dr Eddie Dibbles for her diabetes.  On carafate now.  Also taking protonix and reglan.       Objective:   Physical Exam  Filed Vitals:   10/20/13 1010  BP: 122/62  Pulse: 93  Temp: 98 F (51.19 C)   68 year old male in no acute distress.  HEENT:  Nares - clear.  Oropharynx - without lesions. NECK:  Supple.  Nontender.  No audible carotid bruit.  HEART:  Appears to be regular with premature beats.   LUNGS:  No crackles or wheezing audible.  Respirations even and unlabored.   RADIAL PULSE:  Equal bilaterally.  ABDOMEN:  Soft.  Nontender.  Bowel sounds present and normal.  No audible abdominal bruit.   EXTREMITIES:  No increased edema present.  DP pulses palpable and equal bilaterally.     FEET:  Without lesions.       Assessment & Plan:  CARDIOVASCULAR.  Given the premature beats, EKG obtained and revealed SR with PACs.  Just evaluated by cardiology.  States everything checked out fine.    HEALTH MAINTENANCE.  Physical  06/20/13.   Saw GI previously.  They wanted to do a colonoscopy and EGD.  He declined.  Will refer back to GI for evaluation and further w/up.

## 2013-10-23 ENCOUNTER — Encounter: Payer: Self-pay | Admitting: Internal Medicine

## 2013-10-23 DIAGNOSIS — K219 Gastro-esophageal reflux disease without esophagitis: Secondary | ICD-10-CM | POA: Insufficient documentation

## 2013-10-23 NOTE — Assessment & Plan Note (Signed)
Seen in ER 09/15/13.  Saw cardiology.  States everything checked out fine.  Felt to be GI origin.  Refer to GI as outlined.

## 2013-10-23 NOTE — Assessment & Plan Note (Signed)
Blood pressure doing well.  Continue current medication regimen.  Follow metabolic panel.

## 2013-10-23 NOTE — Assessment & Plan Note (Signed)
Followed by Dr Eddie Dibbles.  Obtain records.  On Tapazole.

## 2013-10-23 NOTE — Assessment & Plan Note (Signed)
On protonix, reglan and carafate.  Symptoms improved.  Previously saw GI.  They had wanted to do a colonoscopy/EGD.  He declined.  Refer back to GI for evaluation and question of need for colonoscopy/EGD.

## 2013-10-23 NOTE — Assessment & Plan Note (Signed)
Has an insulin pump now.  Sees Dr Eddie Dibbles.

## 2013-10-23 NOTE — Assessment & Plan Note (Signed)
On reglan and protonix now.  Currently asymptomatic.  Follow.

## 2013-10-23 NOTE — Assessment & Plan Note (Signed)
On pravastatin.  Follow lipid panel and liver function.

## 2013-11-16 DIAGNOSIS — I2510 Atherosclerotic heart disease of native coronary artery without angina pectoris: Secondary | ICD-10-CM | POA: Diagnosis not present

## 2013-11-16 DIAGNOSIS — K219 Gastro-esophageal reflux disease without esophagitis: Secondary | ICD-10-CM | POA: Diagnosis not present

## 2013-11-16 DIAGNOSIS — I471 Supraventricular tachycardia: Secondary | ICD-10-CM | POA: Diagnosis not present

## 2013-11-16 DIAGNOSIS — I491 Atrial premature depolarization: Secondary | ICD-10-CM | POA: Diagnosis not present

## 2013-11-16 DIAGNOSIS — I4949 Other premature depolarization: Secondary | ICD-10-CM | POA: Diagnosis not present

## 2013-11-16 DIAGNOSIS — I10 Essential (primary) hypertension: Secondary | ICD-10-CM | POA: Diagnosis not present

## 2013-11-16 DIAGNOSIS — E785 Hyperlipidemia, unspecified: Secondary | ICD-10-CM | POA: Diagnosis not present

## 2013-11-16 DIAGNOSIS — E119 Type 2 diabetes mellitus without complications: Secondary | ICD-10-CM | POA: Diagnosis not present

## 2013-11-16 DIAGNOSIS — Z86010 Personal history of colonic polyps: Secondary | ICD-10-CM | POA: Diagnosis not present

## 2013-11-16 DIAGNOSIS — R079 Chest pain, unspecified: Secondary | ICD-10-CM | POA: Diagnosis not present

## 2013-11-17 ENCOUNTER — Other Ambulatory Visit: Payer: Self-pay | Admitting: Internal Medicine

## 2013-11-20 DIAGNOSIS — I491 Atrial premature depolarization: Secondary | ICD-10-CM | POA: Diagnosis not present

## 2013-11-24 DIAGNOSIS — H35359 Cystoid macular degeneration, unspecified eye: Secondary | ICD-10-CM | POA: Diagnosis not present

## 2013-12-25 ENCOUNTER — Other Ambulatory Visit: Payer: Self-pay | Admitting: Internal Medicine

## 2014-01-08 ENCOUNTER — Ambulatory Visit: Payer: Self-pay | Admitting: Unknown Physician Specialty

## 2014-01-08 DIAGNOSIS — Z79899 Other long term (current) drug therapy: Secondary | ICD-10-CM | POA: Diagnosis not present

## 2014-01-08 DIAGNOSIS — I10 Essential (primary) hypertension: Secondary | ICD-10-CM | POA: Diagnosis not present

## 2014-01-08 DIAGNOSIS — Z86010 Personal history of colonic polyps: Secondary | ICD-10-CM | POA: Diagnosis not present

## 2014-01-08 DIAGNOSIS — Z1211 Encounter for screening for malignant neoplasm of colon: Secondary | ICD-10-CM | POA: Diagnosis not present

## 2014-01-08 DIAGNOSIS — E119 Type 2 diabetes mellitus without complications: Secondary | ICD-10-CM | POA: Diagnosis not present

## 2014-01-08 DIAGNOSIS — Z09 Encounter for follow-up examination after completed treatment for conditions other than malignant neoplasm: Secondary | ICD-10-CM | POA: Diagnosis not present

## 2014-01-08 DIAGNOSIS — Z7982 Long term (current) use of aspirin: Secondary | ICD-10-CM | POA: Diagnosis not present

## 2014-01-08 DIAGNOSIS — K219 Gastro-esophageal reflux disease without esophagitis: Secondary | ICD-10-CM | POA: Diagnosis not present

## 2014-01-08 DIAGNOSIS — K649 Unspecified hemorrhoids: Secondary | ICD-10-CM | POA: Diagnosis not present

## 2014-01-08 DIAGNOSIS — E0500 Thyrotoxicosis with diffuse goiter without thyrotoxic crisis or storm: Secondary | ICD-10-CM | POA: Diagnosis not present

## 2014-01-08 DIAGNOSIS — K319 Disease of stomach and duodenum, unspecified: Secondary | ICD-10-CM | POA: Diagnosis not present

## 2014-01-08 DIAGNOSIS — K648 Other hemorrhoids: Secondary | ICD-10-CM | POA: Diagnosis not present

## 2014-01-08 DIAGNOSIS — D126 Benign neoplasm of colon, unspecified: Secondary | ICD-10-CM | POA: Diagnosis not present

## 2014-01-08 DIAGNOSIS — I2510 Atherosclerotic heart disease of native coronary artery without angina pectoris: Secondary | ICD-10-CM | POA: Diagnosis not present

## 2014-01-08 DIAGNOSIS — K2940 Chronic atrophic gastritis without bleeding: Secondary | ICD-10-CM | POA: Diagnosis not present

## 2014-01-08 DIAGNOSIS — K6389 Other specified diseases of intestine: Secondary | ICD-10-CM | POA: Diagnosis not present

## 2014-01-08 DIAGNOSIS — K2960 Other gastritis without bleeding: Secondary | ICD-10-CM | POA: Diagnosis not present

## 2014-01-08 DIAGNOSIS — K2970 Gastritis, unspecified, without bleeding: Secondary | ICD-10-CM | POA: Diagnosis not present

## 2014-01-08 DIAGNOSIS — Z794 Long term (current) use of insulin: Secondary | ICD-10-CM | POA: Diagnosis not present

## 2014-01-08 LAB — HM COLONOSCOPY

## 2014-01-25 DIAGNOSIS — K2940 Chronic atrophic gastritis without bleeding: Secondary | ICD-10-CM | POA: Diagnosis not present

## 2014-02-13 DIAGNOSIS — E7800 Pure hypercholesterolemia, unspecified: Secondary | ICD-10-CM | POA: Diagnosis not present

## 2014-02-13 DIAGNOSIS — E109 Type 1 diabetes mellitus without complications: Secondary | ICD-10-CM | POA: Diagnosis not present

## 2014-02-13 DIAGNOSIS — E0500 Thyrotoxicosis with diffuse goiter without thyrotoxic crisis or storm: Secondary | ICD-10-CM | POA: Diagnosis not present

## 2014-02-19 ENCOUNTER — Other Ambulatory Visit: Payer: Medicare Other

## 2014-02-20 DIAGNOSIS — H35359 Cystoid macular degeneration, unspecified eye: Secondary | ICD-10-CM | POA: Diagnosis not present

## 2014-02-21 DIAGNOSIS — I2510 Atherosclerotic heart disease of native coronary artery without angina pectoris: Secondary | ICD-10-CM | POA: Diagnosis not present

## 2014-02-21 DIAGNOSIS — E785 Hyperlipidemia, unspecified: Secondary | ICD-10-CM | POA: Diagnosis not present

## 2014-02-21 DIAGNOSIS — E119 Type 2 diabetes mellitus without complications: Secondary | ICD-10-CM | POA: Diagnosis not present

## 2014-02-21 DIAGNOSIS — I493 Ventricular premature depolarization: Secondary | ICD-10-CM | POA: Insufficient documentation

## 2014-02-21 DIAGNOSIS — I10 Essential (primary) hypertension: Secondary | ICD-10-CM | POA: Diagnosis not present

## 2014-02-23 ENCOUNTER — Ambulatory Visit: Payer: Medicare Other | Admitting: Internal Medicine

## 2014-03-15 ENCOUNTER — Encounter (INDEPENDENT_AMBULATORY_CARE_PROVIDER_SITE_OTHER): Payer: Self-pay

## 2014-03-15 ENCOUNTER — Other Ambulatory Visit (INDEPENDENT_AMBULATORY_CARE_PROVIDER_SITE_OTHER): Payer: Medicare Other

## 2014-03-15 DIAGNOSIS — E7800 Pure hypercholesterolemia, unspecified: Secondary | ICD-10-CM

## 2014-03-15 DIAGNOSIS — E119 Type 2 diabetes mellitus without complications: Secondary | ICD-10-CM | POA: Diagnosis not present

## 2014-03-15 LAB — HEPATIC FUNCTION PANEL
ALT: 8 U/L (ref 0–53)
AST: 17 U/L (ref 0–37)
Albumin: 3.4 g/dL — ABNORMAL LOW (ref 3.5–5.2)
Alkaline Phosphatase: 76 U/L (ref 39–117)
BILIRUBIN DIRECT: 0.2 mg/dL (ref 0.0–0.3)
BILIRUBIN TOTAL: 0.8 mg/dL (ref 0.2–1.2)
Total Protein: 6.5 g/dL (ref 6.0–8.3)

## 2014-03-15 LAB — BASIC METABOLIC PANEL
BUN: 5 mg/dL — ABNORMAL LOW (ref 6–23)
CO2: 29 mEq/L (ref 19–32)
CREATININE: 1.2 mg/dL (ref 0.4–1.5)
Calcium: 8.3 mg/dL — ABNORMAL LOW (ref 8.4–10.5)
Chloride: 103 mEq/L (ref 96–112)
GFR: 66.36 mL/min (ref >60.00)
GLUCOSE: 210 mg/dL — AB (ref 70–99)
POTASSIUM: 3.5 meq/L (ref 3.5–5.1)
Sodium: 138 mEq/L (ref 135–145)

## 2014-03-15 LAB — LIPID PANEL
CHOLESTEROL: 129 mg/dL (ref 0–200)
HDL: 49.1 mg/dL (ref >39.00)
LDL Cholesterol: 67 mg/dL (ref 0–99)
NonHDL: 79.9
TRIGLYCERIDES: 63 mg/dL (ref 0.0–149.0)
Total CHOL/HDL Ratio: 3
VLDL: 12.6 mg/dL (ref 0.0–40.0)

## 2014-03-21 ENCOUNTER — Telehealth: Payer: Self-pay | Admitting: Internal Medicine

## 2014-03-21 ENCOUNTER — Ambulatory Visit (INDEPENDENT_AMBULATORY_CARE_PROVIDER_SITE_OTHER): Payer: Medicare Other | Admitting: Internal Medicine

## 2014-03-21 ENCOUNTER — Encounter: Payer: Self-pay | Admitting: Internal Medicine

## 2014-03-21 VITALS — BP 120/60 | HR 60 | Temp 98.2°F | Ht 70.25 in | Wt 242.5 lb

## 2014-03-21 DIAGNOSIS — I10 Essential (primary) hypertension: Secondary | ICD-10-CM

## 2014-03-21 DIAGNOSIS — Z733 Stress, not elsewhere classified: Secondary | ICD-10-CM

## 2014-03-21 DIAGNOSIS — K3184 Gastroparesis: Secondary | ICD-10-CM

## 2014-03-21 DIAGNOSIS — E079 Disorder of thyroid, unspecified: Secondary | ICD-10-CM | POA: Diagnosis not present

## 2014-03-21 DIAGNOSIS — E119 Type 2 diabetes mellitus without complications: Secondary | ICD-10-CM | POA: Diagnosis not present

## 2014-03-21 DIAGNOSIS — K219 Gastro-esophageal reflux disease without esophagitis: Secondary | ICD-10-CM

## 2014-03-21 DIAGNOSIS — E7800 Pure hypercholesterolemia, unspecified: Secondary | ICD-10-CM | POA: Diagnosis not present

## 2014-03-21 DIAGNOSIS — F439 Reaction to severe stress, unspecified: Secondary | ICD-10-CM | POA: Insufficient documentation

## 2014-03-21 NOTE — Assessment & Plan Note (Signed)
Increased stress with his family situation as outlined.  Discussed counseling and medication.  He declines counseling and wants to hold on medication at this time.  Get him back in soon to reassess.  Call if problems before.

## 2014-03-21 NOTE — Assessment & Plan Note (Addendum)
On pravastatin.  Follow lipid panel and liver function.  Cholesterol just checked and wnl.

## 2014-03-21 NOTE — Assessment & Plan Note (Signed)
On protonix, reglan and carafate.  Symptoms improved.  Previously saw GI.  Had recent EGD and colonoscopy.  Need results.  Doing well if avoids spicy foods.

## 2014-03-21 NOTE — Telephone Encounter (Signed)
See if he can come in on 04/17/14 at 12:00.  Thanks.

## 2014-03-21 NOTE — Assessment & Plan Note (Signed)
Followed by Dr Eddie Dibbles.  On Tapazole 1/2 (10mg ) q day.

## 2014-03-21 NOTE — Assessment & Plan Note (Signed)
Has an insulin pump now.  Sees Dr Eddie Dibbles.

## 2014-03-21 NOTE — Progress Notes (Signed)
Subjective:    [name], male    DOB: 08-Oct-1946, 68 y.o.   MRN: [id number]  HPI 68 year old male with past history of diabetes, hypertension, hypercholesterolemia and thyroid disease.  He is followed by Dr Eddie Dibbles for his diabetes and thyroid issues.  He now has an insulin pump.  Not having as much problems with lows.  He comes in today for a scheduled follow up.  No chest pain or tightness now.  Was seen on 09/15/13 in the ER for chest pain. Labs, cxr and EKG obtained.  Felt related to a GI origin.  Was placed on carafate.   Saw cardiology recently.  States everything checked out fine.  Symptoms better.  Breathing stable.  Bowels stable.  If he avoids spicy foods, does better.  Had EGD and colonoscopy.  Had some minimal esophageal irritation (per his report).  Also, found two polyps.  Need results.  Increased stress with his family situation.  His son is back in there house.  Does not work.  Increased stress on both the patient and his wife.  We discussed this at length.  Discussed restarting an antidepressant and counseling.  He declines counseling.  He desires not to restart medication at this time.     Past Medical History  Diagnosis Date  . Peripheral neuropathy   . Hypertension   . 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 Hypercholesterolemia   . Gastroparesis   . Graves disease   . Depression     Outpatient Encounter Prescriptions as of 03/21/2014  Medication Sig  . insulin aspart (NOVOLOG) 100 UNIT/ML injection Use as directed  . lisinopril (PRINIVIL,ZESTRIL) 20 MG tablet TAKE 1 TABLET (20 MG TOTAL) BY MOUTH AT BEDTIME.  . methimazole (TAPAZOLE) 10 MG tablet Take 20 mg by mouth 2 (two) times daily.  . metoCLOPramide (REGLAN) 10 MG tablet TAKE 1 TABLET (10 MG TOTAL) BY MOUTH AT BEDTIME.  . pantoprazole (PROTONIX) 40 MG tablet TAKE 1 TABLET (40 MG TOTAL) BY MOUTH DAILY.  . pravastatin (PRAVACHOL) 40 MG tablet TAKE 1  TABLET (40 MG TOTAL) BY MOUTH AT BEDTIME.  . sucralfate (CARAFATE) 1 G tablet TAKE 1 TABLET BY MOUTH 4 TIMES A DAY BEFORE MEALS AND AT BEDTIME FOR 30 DAYS    Review of Systems Patient denies any headache, lightheadedness or dizziness.  No sinus or allergy symptoms.  No palpitations.  No chest pain or tightness now.  Was seen in ER 09/15/13 as outlined.  See ER notes for details.   No increased shortness of breath, cough or congestion.  No vomiting.  No BRBPR or melana.  No bowel change.   No urine change.  Seeing Dr Eddie Dibbles for his diabetes.  On carafate now.  Also taking protonix and reglan.  Symptoms controlled if he watches what he eats and avoids spicy foods.  Increased stress as outlined.        Objective:   Physical Exam  Filed Vitals:   03/21/14 0931  BP: 120/60  Pulse: 60  Temp: 98.2 F (36.8 C)   Blood pressure recheck: 130-132/62-66, pulse 61  68 year old male in no acute distress.  HEENT:  Nares - clear.  Oropharynx - without lesions. NECK:  Supple.  Nontender.  No audible carotid bruit.  HEART:  Appears to be regular with premature beats.   LUNGS:  No crackles or wheezing audible.  Respirations even and unlabored.   RADIAL PULSE:  Equal bilaterally.  ABDOMEN:  Soft.  Nontender.  Bowel sounds present and normal.  No audible abdominal bruit.   EXTREMITIES:  No increased edema present.  DP pulses palpable and equal bilaterally.     FEET:  Without lesions.       Assessment & Plan:  CARDIOVASCULAR.  Stable.  Followed by cardiology.      HEALTH MAINTENANCE.  Physical 06/20/13.   Saw GI previously.  Just had colonoscopy and EGD.  Obtain results.    I spent 25 minutes with the patient and more than 50% of the time was spent in consultation regarding the above. (specifically discussion the increased stress, sugars, etc).

## 2014-03-21 NOTE — Assessment & Plan Note (Signed)
On reglan, carafate and protonix now.  Currently asymptomatic.  Follow.

## 2014-03-21 NOTE — Telephone Encounter (Signed)
Pt had recent EGD and colonoscopy - [facility].  Need results.  Thanks.

## 2014-03-21 NOTE — Assessment & Plan Note (Signed)
Blood pressure doing well.  Continue current medication regimen.  Follow metabolic panel.

## 2014-03-21 NOTE — Progress Notes (Signed)
Pre visit review using our clinic review tool, if applicable. No additional management support is needed unless otherwise documented below in the visit note. 

## 2014-03-21 NOTE — Telephone Encounter (Signed)
Dr. Nicki Reaper wanted to see the patient back in 5wks. The next 30 mins appt that she has is 9/4 at 2:30. Please advise/msn

## 2014-03-22 NOTE — Telephone Encounter (Signed)
Spoke with Maudie Mercury @ [facility], will fax results.

## 2014-03-23 ENCOUNTER — Encounter: Payer: Self-pay | Admitting: Internal Medicine

## 2014-03-23 NOTE — Telephone Encounter (Signed)
Placed in your folder.

## 2014-03-23 NOTE — Telephone Encounter (Signed)
Noted  

## 2014-04-17 ENCOUNTER — Ambulatory Visit (INDEPENDENT_AMBULATORY_CARE_PROVIDER_SITE_OTHER): Payer: Medicare Other | Admitting: Internal Medicine

## 2014-04-17 ENCOUNTER — Encounter: Payer: Self-pay | Admitting: Internal Medicine

## 2014-04-17 ENCOUNTER — Ambulatory Visit (INDEPENDENT_AMBULATORY_CARE_PROVIDER_SITE_OTHER)
Admission: RE | Admit: 2014-04-17 | Discharge: 2014-04-17 | Disposition: A | Discharge status: Home / Self Care | Payer: Medicare Other | Source: Ambulatory Visit | Attending: Internal Medicine | Admitting: Internal Medicine

## 2014-04-17 VITALS — BP 136/60 | HR 58 | Temp 98.1°F | Ht 70.25 in | Wt 234.8 lb

## 2014-04-17 DIAGNOSIS — R0602 Shortness of breath: Secondary | ICD-10-CM

## 2014-04-17 DIAGNOSIS — R059 Cough, unspecified: Secondary | ICD-10-CM | POA: Diagnosis not present

## 2014-04-17 DIAGNOSIS — E7800 Pure hypercholesterolemia, unspecified: Secondary | ICD-10-CM

## 2014-04-17 DIAGNOSIS — I499 Cardiac arrhythmia, unspecified: Secondary | ICD-10-CM | POA: Diagnosis not present

## 2014-04-17 DIAGNOSIS — R05 Cough: Secondary | ICD-10-CM | POA: Diagnosis not present

## 2014-04-17 DIAGNOSIS — F439 Reaction to severe stress, unspecified: Secondary | ICD-10-CM

## 2014-04-17 DIAGNOSIS — K3184 Gastroparesis: Secondary | ICD-10-CM

## 2014-04-17 DIAGNOSIS — I798 Other disorders of arteries, arterioles and capillaries in diseases classified elsewhere: Secondary | ICD-10-CM

## 2014-04-17 DIAGNOSIS — E079 Disorder of thyroid, unspecified: Secondary | ICD-10-CM

## 2014-04-17 DIAGNOSIS — E1151 Type 2 diabetes mellitus with diabetic peripheral angiopathy without gangrene: Secondary | ICD-10-CM

## 2014-04-17 DIAGNOSIS — K219 Gastro-esophageal reflux disease without esophagitis: Secondary | ICD-10-CM

## 2014-04-17 DIAGNOSIS — E1159 Type 2 diabetes mellitus with other circulatory complications: Secondary | ICD-10-CM

## 2014-04-17 DIAGNOSIS — Z733 Stress, not elsewhere classified: Secondary | ICD-10-CM

## 2014-04-17 DIAGNOSIS — I10 Essential (primary) hypertension: Secondary | ICD-10-CM

## 2014-04-17 DIAGNOSIS — R079 Chest pain, unspecified: Secondary | ICD-10-CM

## 2014-04-17 MED ORDER — FLUTICASONE PROPIONATE HFA 110 MCG/ACT IN AERO: 2.0000 | INHALATION_SPRAY | Freq: Two times a day (BID) | RESPIRATORY_TRACT | Status: DC

## 2014-04-17 NOTE — Progress Notes (Signed)
Pre visit review using our clinic review tool, if applicable. No additional management support is needed unless otherwise documented below in the visit note. 

## 2014-04-20 DIAGNOSIS — I2510 Atherosclerotic heart disease of native coronary artery without angina pectoris: Secondary | ICD-10-CM | POA: Diagnosis not present

## 2014-04-20 DIAGNOSIS — R0602 Shortness of breath: Secondary | ICD-10-CM | POA: Diagnosis not present

## 2014-04-22 ENCOUNTER — Encounter: Payer: Self-pay | Admitting: Internal Medicine

## 2014-04-22 DIAGNOSIS — R0602 Shortness of breath: Secondary | ICD-10-CM | POA: Insufficient documentation

## 2014-04-22 DIAGNOSIS — I499 Cardiac arrhythmia, unspecified: Secondary | ICD-10-CM | POA: Insufficient documentation

## 2014-04-22 NOTE — Assessment & Plan Note (Signed)
Followed by Dr Eddie Dibbles.  On Tapazole 1/2 (10mg ) q day.

## 2014-04-22 NOTE — Assessment & Plan Note (Signed)
Discussed at length with him today.  Discussed his home situation.  Discussed the possibility of moving.  He is looking into his options.  Will refer to psych for further evaluation and treatment.  He is in agreement.

## 2014-04-22 NOTE — Assessment & Plan Note (Signed)
On pravastatin.  Follow lipid panel and liver function.  Cholesterol just checked and wnl.

## 2014-04-22 NOTE — Assessment & Plan Note (Signed)
Some intermittent chest pain as outlined.  He feels is related to increased stress.  Is followed by cardiology (Dr Nehemiah Massed).  EKG obtained and revealed SR with frequent PACs.  No acute ischemic changes.  Dr Nehemiah Massed reviewed EKG.  Felt unchanged.  Discussed with cardiology.  They are planning to see pt this week for further evaluation and testing, especially given the increased pain and sob with exertion.  Pt comfortable with this plan.  Will be evaluated if symptoms change or worsen.

## 2014-04-22 NOTE — Assessment & Plan Note (Signed)
Has an insulin pump now.  Sees Dr Eddie Dibbles.

## 2014-04-22 NOTE — Assessment & Plan Note (Signed)
Blood pressure doing well.  Continue current medication regimen.  Follow metabolic panel.

## 2014-04-22 NOTE — Assessment & Plan Note (Signed)
On reglan, carafate and protonix now.  Currently asymptomatic.  Follow.

## 2014-04-22 NOTE — Assessment & Plan Note (Signed)
On protonix, reglan and carafate.  Symptoms improved.  Previously saw GI.  Had recent EGD and colonoscopy.   Doing well if avoids spicy foods.

## 2014-04-22 NOTE — Progress Notes (Signed)
Subjective:    [name], male    DOB: Apr 28, 1946, 68 y.o.   MRN: [id number]  HPI 68 year old male with past history of diabetes, hypertension, hypercholesterolemia and thyroid disease.  He is followed by Dr Eddie Dibbles for his diabetes and thyroid issues.  He now has an insulin pump.  Not having as much problems with lows.  He comes in today for a scheduled follow up.  Was seen on 09/15/13 in the ER for chest pain. Labs, cxr and EKG obtained.  Felt related to a GI origin.  Was placed on carafate.   Saw cardiology recently.  States everything checked out fine.  States they are planning to do an ECHO on him next month.  He reports noticing some sob with exertion. Some increased cough and congestion.   Also, occasionally will notice some chest discomfort.  He feels related to stress.   Bowels stable.  If he avoids spicy foods, does better.  Had EGD and colonoscopy.  Had some minimal esophageal irritation (per his report).  Also, found two polyps.   Increased stress with his family situation.  His son is back in the house.  Does not work.  Increased stress on both the patient and his wife.  We discussed this at length.  Discussed restarting an antidepressant and counseling.  He agrees to counseling.  He desires not to restart medication at this time.  Discussed the possibility of moving.  He is looking in to his options.     Past Medical History  Diagnosis Date  . Peripheral neuropathy   . Hypertension   . 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 Hypercholesterolemia   . Gastroparesis   . Graves disease   . Depression     Outpatient Encounter Prescriptions as of 04/17/2014  Medication Sig  . insulin aspart (NOVOLOG) 100 UNIT/ML injection Use as directed  . lisinopril (PRINIVIL,ZESTRIL) 20 MG tablet TAKE 1 TABLET (20 MG TOTAL) BY MOUTH AT BEDTIME.  . methimazole (TAPAZOLE) 10 MG tablet Take 20 mg by mouth 2 (two) times daily.  .  metoCLOPramide (REGLAN) 10 MG tablet TAKE 1 TABLET (10 MG TOTAL) BY MOUTH AT BEDTIME.  . pantoprazole (PROTONIX) 40 MG tablet TAKE 1 TABLET (40 MG TOTAL) BY MOUTH DAILY.  . pravastatin (PRAVACHOL) 40 MG tablet TAKE 1 TABLET (40 MG TOTAL) BY MOUTH AT BEDTIME.  . sucralfate (CARAFATE) 1 G tablet TAKE 1 TABLET BY MOUTH 4 TIMES A DAY BEFORE MEALS AND AT BEDTIME FOR 30 DAYS  . fluticasone (FLOVENT HFA) 110 MCG/ACT inhaler Inhale 2 puffs into the lungs 2 (two) times daily.    Review of Systems Patient denies any headache, lightheadedness or dizziness.  No sinus or allergy symptoms.  Does report some intermittent chest discomfort.  He relates to increased stress.   Some shortness of breath with exertion.  Some increased cough and congestion.  No vomiting.  No BRBPR or melana.  No bowel change.   No urine change.  Seeing Dr Eddie Dibbles for his diabetes.  On carafate now.  Also taking protonix and reglan.  Symptoms controlled if he watches what he eats and avoids spicy foods.  Increased stress as outlined.   Discussed at length with him today.       Objective:   Physical Exam  Filed Vitals:   04/17/14 1155  BP: 136/60  Pulse: 58  Temp: 98.1 F (1.51 C)   68 year old male  in no acute distress.  HEENT:  Nares - clear.  Oropharynx - without lesions. NECK:  Supple.  Nontender.  No audible carotid bruit.  HEART:  Appears to be regular with premature beats.   LUNGS:  No crackles or wheezing audible.  Respirations even and unlabored.   RADIAL PULSE:  Equal bilaterally.  ABDOMEN:  Soft.  Nontender.  Bowel sounds present and normal.  No audible abdominal bruit.   EXTREMITIES:  No increased edema present.  DP pulses palpable and equal bilaterally.     FEET:  Without lesions.       Assessment & Plan:  HEALTH MAINTENANCE.  Physical 06/20/13.   Saw GI previously.  Just had colonoscopy and EGD.     I spent 25 minutes with the patient and more than 50% of the time was spent in consultation regarding the above.  (specifically discussion the increased stress, sugars, etc).

## 2014-04-22 NOTE — Assessment & Plan Note (Signed)
Describes the sob with exertion.  Pursue cardiac w/up as outlined.  EKG as outlined.  Check cxr.  Treat with mucinex and Flovent inhaler as directed.  Follow.

## 2014-04-22 NOTE — Assessment & Plan Note (Signed)
EKG reviewed by me and cardiology.  Appears to be SR with frequent PACs.  Cardiac w/up planned as outlined.

## 2014-04-24 ENCOUNTER — Other Ambulatory Visit: Payer: Self-pay | Admitting: *Deleted

## 2014-04-24 MED ORDER — FLUTICASONE PROPIONATE HFA 110 MCG/ACT IN AERO: 2.0000 | INHALATION_SPRAY | Freq: Two times a day (BID) | RESPIRATORY_TRACT | Status: DC

## 2014-04-25 DIAGNOSIS — I4949 Other premature depolarization: Secondary | ICD-10-CM | POA: Diagnosis not present

## 2014-04-25 DIAGNOSIS — R0602 Shortness of breath: Secondary | ICD-10-CM | POA: Diagnosis not present

## 2014-04-25 DIAGNOSIS — I10 Essential (primary) hypertension: Secondary | ICD-10-CM | POA: Diagnosis not present

## 2014-04-25 DIAGNOSIS — I2510 Atherosclerotic heart disease of native coronary artery without angina pectoris: Secondary | ICD-10-CM | POA: Diagnosis not present

## 2014-04-29 ENCOUNTER — Encounter: Payer: Self-pay | Admitting: Internal Medicine

## 2014-04-29 DIAGNOSIS — R0602 Shortness of breath: Secondary | ICD-10-CM

## 2014-04-29 DIAGNOSIS — Z86010 Personal history of colonic polyps: Secondary | ICD-10-CM | POA: Insufficient documentation

## 2014-04-29 DIAGNOSIS — K219 Gastro-esophageal reflux disease without esophagitis: Secondary | ICD-10-CM

## 2014-05-02 ENCOUNTER — Encounter: Payer: Self-pay | Admitting: Internal Medicine

## 2014-05-04 ENCOUNTER — Other Ambulatory Visit: Payer: Self-pay

## 2014-05-10 ENCOUNTER — Encounter: Payer: Self-pay | Admitting: *Deleted

## 2014-05-14 ENCOUNTER — Encounter: Payer: Self-pay | Admitting: Internal Medicine

## 2014-05-14 DIAGNOSIS — Z86010 Personal history of colonic polyps: Secondary | ICD-10-CM

## 2014-05-14 DIAGNOSIS — K219 Gastro-esophageal reflux disease without esophagitis: Secondary | ICD-10-CM

## 2014-05-22 DIAGNOSIS — E119 Type 2 diabetes mellitus without complications: Secondary | ICD-10-CM | POA: Diagnosis not present

## 2014-05-22 DIAGNOSIS — E109 Type 1 diabetes mellitus without complications: Secondary | ICD-10-CM | POA: Diagnosis not present

## 2014-05-22 DIAGNOSIS — E0500 Thyrotoxicosis with diffuse goiter without thyrotoxic crisis or storm: Secondary | ICD-10-CM | POA: Diagnosis not present

## 2014-05-22 LAB — HM DIABETES EYE EXAM

## 2014-05-29 ENCOUNTER — Encounter: Payer: Self-pay | Admitting: Internal Medicine

## 2014-05-29 DIAGNOSIS — E109 Type 1 diabetes mellitus without complications: Secondary | ICD-10-CM | POA: Diagnosis not present

## 2014-05-29 DIAGNOSIS — E0500 Thyrotoxicosis with diffuse goiter without thyrotoxic crisis or storm: Secondary | ICD-10-CM | POA: Diagnosis not present

## 2014-05-29 DIAGNOSIS — E1169 Type 2 diabetes mellitus with other specified complication: Secondary | ICD-10-CM | POA: Diagnosis not present

## 2014-06-12 ENCOUNTER — Ambulatory Visit: Payer: Self-pay | Admitting: Internal Medicine

## 2014-06-13 ENCOUNTER — Observation Stay: Payer: Self-pay | Admitting: Internal Medicine

## 2014-06-13 DIAGNOSIS — N390 Urinary tract infection, site not specified: Secondary | ICD-10-CM | POA: Diagnosis not present

## 2014-06-13 DIAGNOSIS — E43 Unspecified severe protein-calorie malnutrition: Secondary | ICD-10-CM | POA: Diagnosis not present

## 2014-06-13 DIAGNOSIS — Z6831 Body mass index (BMI) 31.0-31.9, adult: Secondary | ICD-10-CM | POA: Diagnosis not present

## 2014-06-13 DIAGNOSIS — R112 Nausea with vomiting, unspecified: Secondary | ICD-10-CM | POA: Diagnosis not present

## 2014-06-13 DIAGNOSIS — Z9641 Presence of insulin pump (external) (internal): Secondary | ICD-10-CM | POA: Diagnosis not present

## 2014-06-13 DIAGNOSIS — K819 Cholecystitis, unspecified: Secondary | ICD-10-CM | POA: Diagnosis not present

## 2014-06-13 DIAGNOSIS — K8020 Calculus of gallbladder without cholecystitis without obstruction: Secondary | ICD-10-CM | POA: Diagnosis not present

## 2014-06-13 DIAGNOSIS — R109 Unspecified abdominal pain: Secondary | ICD-10-CM | POA: Diagnosis not present

## 2014-06-13 DIAGNOSIS — Z79899 Other long term (current) drug therapy: Secondary | ICD-10-CM | POA: Diagnosis not present

## 2014-06-13 DIAGNOSIS — R634 Abnormal weight loss: Secondary | ICD-10-CM | POA: Diagnosis not present

## 2014-06-13 DIAGNOSIS — E876 Hypokalemia: Secondary | ICD-10-CM | POA: Diagnosis not present

## 2014-06-13 DIAGNOSIS — R1012 Left upper quadrant pain: Secondary | ICD-10-CM | POA: Diagnosis not present

## 2014-06-13 DIAGNOSIS — I10 Essential (primary) hypertension: Secondary | ICD-10-CM | POA: Diagnosis not present

## 2014-06-13 DIAGNOSIS — R079 Chest pain, unspecified: Secondary | ICD-10-CM | POA: Diagnosis not present

## 2014-06-13 DIAGNOSIS — E1069 Type 1 diabetes mellitus with other specified complication: Secondary | ICD-10-CM | POA: Diagnosis not present

## 2014-06-13 DIAGNOSIS — E0590 Thyrotoxicosis, unspecified without thyrotoxic crisis or storm: Secondary | ICD-10-CM | POA: Diagnosis not present

## 2014-06-13 DIAGNOSIS — K838 Other specified diseases of biliary tract: Secondary | ICD-10-CM | POA: Diagnosis not present

## 2014-06-13 DIAGNOSIS — R141 Gas pain: Secondary | ICD-10-CM | POA: Diagnosis not present

## 2014-06-13 DIAGNOSIS — R110 Nausea: Secondary | ICD-10-CM | POA: Diagnosis not present

## 2014-06-13 DIAGNOSIS — K219 Gastro-esophageal reflux disease without esophagitis: Secondary | ICD-10-CM | POA: Diagnosis not present

## 2014-06-13 DIAGNOSIS — Z794 Long term (current) use of insulin: Secondary | ICD-10-CM | POA: Diagnosis not present

## 2014-06-13 DIAGNOSIS — Z905 Acquired absence of kidney: Secondary | ICD-10-CM | POA: Diagnosis not present

## 2014-06-13 DIAGNOSIS — K810 Acute cholecystitis: Secondary | ICD-10-CM | POA: Diagnosis not present

## 2014-06-13 DIAGNOSIS — N289 Disorder of kidney and ureter, unspecified: Secondary | ICD-10-CM | POA: Diagnosis not present

## 2014-06-13 DIAGNOSIS — I4510 Unspecified right bundle-branch block: Secondary | ICD-10-CM | POA: Diagnosis not present

## 2014-06-13 DIAGNOSIS — R1013 Epigastric pain: Secondary | ICD-10-CM | POA: Diagnosis not present

## 2014-06-13 DIAGNOSIS — R599 Enlarged lymph nodes, unspecified: Secondary | ICD-10-CM | POA: Diagnosis not present

## 2014-06-13 LAB — CBC WITH DIFFERENTIAL/PLATELET
BASOS ABS: 0.1 10*3/uL (ref 0.0–0.1)
Basophil %: 0.6 %
EOS ABS: 0.1 10*3/uL (ref 0.0–0.7)
Eosinophil %: 0.6 %
HCT: 43.6 % (ref 40.0–52.0)
HGB: 15 g/dL (ref 13.0–18.0)
LYMPHS ABS: 1.1 10*3/uL (ref 1.0–3.6)
Lymphocyte %: 11.8 %
MCH: 30.8 pg (ref 26.0–34.0)
MCHC: 34.3 g/dL (ref 32.0–36.0)
MCV: 90 fL (ref 80–100)
MONO ABS: 0.5 x10 3/mm (ref 0.2–1.0)
Monocyte %: 4.9 %
NEUTROS ABS: 7.6 10*3/uL — AB (ref 1.4–6.5)
Neutrophil %: 82.1 %
Platelet: 189 10*3/uL (ref 150–440)
RBC: 4.86 10*6/uL (ref 4.40–5.90)
RDW: 14.5 % (ref 11.5–14.5)
WBC: 9.3 10*3/uL (ref 3.8–10.6)

## 2014-06-13 LAB — COMPREHENSIVE METABOLIC PANEL
AST: 23 U/L (ref 15–37)
Albumin: 2.9 g/dL — ABNORMAL LOW (ref 3.4–5.0)
Alkaline Phosphatase: 106 U/L
Anion Gap: 10 (ref 7–16)
BILIRUBIN TOTAL: 0.8 mg/dL (ref 0.2–1.0)
BUN: 7 mg/dL (ref 7–18)
CREATININE: 1.16 mg/dL (ref 0.60–1.30)
Calcium, Total: 7.7 mg/dL — ABNORMAL LOW (ref 8.5–10.1)
Chloride: 107 mmol/L (ref 98–107)
Co2: 26 mmol/L (ref 21–32)
EGFR (African American): >60
EGFR (Non-African Amer.): >60
Glucose: 184 mg/dL — ABNORMAL HIGH (ref 65–99)
Osmolality: 288 (ref 275–301)
POTASSIUM: 2.9 mmol/L — AB (ref 3.5–5.1)
SGPT (ALT): 12 U/L — ABNORMAL LOW
Sodium: 143 mmol/L (ref 136–145)
Total Protein: 6.5 g/dL (ref 6.4–8.2)

## 2014-06-13 LAB — URINALYSIS, COMPLETE
Bilirubin,UR: NEGATIVE
Glucose,UR: 50 mg/dL (ref 0–75)
Nitrite: NEGATIVE
Ph: 6 (ref 4.5–8.0)
Protein: NEGATIVE
Specific Gravity: 1.018 (ref 1.003–1.030)
WBC UR: 27 /HPF (ref 0–5)

## 2014-06-13 LAB — CK-MB
CK-MB: 0.8 ng/mL (ref 0.5–3.6)
CK-MB: 1 ng/mL (ref 0.5–3.6)
CK-MB: 1.3 ng/mL (ref 0.5–3.6)

## 2014-06-13 LAB — LIPASE, BLOOD: Lipase: 49 U/L — ABNORMAL LOW (ref 73–393)

## 2014-06-13 LAB — TROPONIN I
TROPONIN-I: 0.02 ng/mL
Troponin-I: <0.02 ng/mL

## 2014-06-14 DIAGNOSIS — K8019 Calculus of gallbladder with other cholecystitis with obstruction: Secondary | ICD-10-CM | POA: Diagnosis not present

## 2014-06-14 DIAGNOSIS — R079 Chest pain, unspecified: Secondary | ICD-10-CM | POA: Diagnosis not present

## 2014-06-14 DIAGNOSIS — R112 Nausea with vomiting, unspecified: Secondary | ICD-10-CM | POA: Diagnosis not present

## 2014-06-14 DIAGNOSIS — R599 Enlarged lymph nodes, unspecified: Secondary | ICD-10-CM | POA: Diagnosis not present

## 2014-06-14 DIAGNOSIS — R109 Unspecified abdominal pain: Secondary | ICD-10-CM | POA: Diagnosis not present

## 2014-06-14 DIAGNOSIS — R634 Abnormal weight loss: Secondary | ICD-10-CM | POA: Diagnosis not present

## 2014-06-14 DIAGNOSIS — R1013 Epigastric pain: Secondary | ICD-10-CM | POA: Diagnosis not present

## 2014-06-14 DIAGNOSIS — N390 Urinary tract infection, site not specified: Secondary | ICD-10-CM | POA: Diagnosis not present

## 2014-06-14 DIAGNOSIS — I4510 Unspecified right bundle-branch block: Secondary | ICD-10-CM | POA: Diagnosis not present

## 2014-06-14 LAB — CBC WITH DIFFERENTIAL/PLATELET
BASOS PCT: 1.2 %
Basophil #: 0.1 10*3/uL (ref 0.0–0.1)
EOS ABS: 0.2 10*3/uL (ref 0.0–0.7)
EOS PCT: 3 %
HCT: 37.3 % — ABNORMAL LOW (ref 40.0–52.0)
HGB: 12.5 g/dL — ABNORMAL LOW (ref 13.0–18.0)
LYMPHS PCT: 27 %
Lymphocyte #: 1.5 10*3/uL (ref 1.0–3.6)
MCH: 30.9 pg (ref 26.0–34.0)
MCHC: 33.7 g/dL (ref 32.0–36.0)
MCV: 92 fL (ref 80–100)
MONO ABS: 0.5 x10 3/mm (ref 0.2–1.0)
Monocyte %: 9.4 %
NEUTROS PCT: 59.4 %
Neutrophil #: 3.3 10*3/uL (ref 1.4–6.5)
Platelet: 158 10*3/uL (ref 150–440)
RBC: 4.06 10*6/uL — ABNORMAL LOW (ref 4.40–5.90)
RDW: 14.7 % — ABNORMAL HIGH (ref 11.5–14.5)
WBC: 5.5 10*3/uL (ref 3.8–10.6)

## 2014-06-14 LAB — BASIC METABOLIC PANEL
ANION GAP: 5 — AB (ref 7–16)
BUN: 6 mg/dL — ABNORMAL LOW (ref 7–18)
Calcium, Total: 6.9 mg/dL — CL (ref 8.5–10.1)
Chloride: 107 mmol/L (ref 98–107)
Co2: 33 mmol/L — ABNORMAL HIGH (ref 21–32)
Creatinine: 1.01 mg/dL (ref 0.60–1.30)
EGFR (African American): >60
GLUCOSE: 51 mg/dL — AB (ref 65–99)
Osmolality: 284 (ref 275–301)
POTASSIUM: 3.1 mmol/L — AB (ref 3.5–5.1)
Sodium: 145 mmol/L (ref 136–145)

## 2014-06-14 LAB — LIPID PANEL
CHOLESTEROL: 82 mg/dL (ref 0–200)
HDL Cholesterol: 38 mg/dL — ABNORMAL LOW (ref 40–60)
Ldl Cholesterol, Calc: 37 mg/dL (ref 0–100)
Triglycerides: 34 mg/dL (ref 0–200)
VLDL Cholesterol, Calc: 7 mg/dL (ref 5–40)

## 2014-06-14 LAB — RAPID HIV SCREEN (HIV 1/2 AB+AG)

## 2014-06-14 LAB — LACTATE DEHYDROGENASE: LDH: 153 U/L (ref 85–241)

## 2014-06-14 LAB — TSH: THYROID STIMULATING HORM: 3.93 u[IU]/mL

## 2014-06-15 ENCOUNTER — Ambulatory Visit: Payer: Medicare Other | Admitting: Internal Medicine

## 2014-06-15 DIAGNOSIS — R109 Unspecified abdominal pain: Secondary | ICD-10-CM | POA: Diagnosis not present

## 2014-06-15 DIAGNOSIS — E162 Hypoglycemia, unspecified: Secondary | ICD-10-CM | POA: Diagnosis not present

## 2014-06-15 DIAGNOSIS — R634 Abnormal weight loss: Secondary | ICD-10-CM | POA: Diagnosis not present

## 2014-06-15 DIAGNOSIS — R599 Enlarged lymph nodes, unspecified: Secondary | ICD-10-CM | POA: Diagnosis not present

## 2014-06-18 LAB — PROT IMMUNOELECTROPHORES(ARMC)

## 2014-06-21 ENCOUNTER — Ambulatory Visit: Payer: PRIVATE HEALTH INSURANCE | Admitting: Internal Medicine

## 2014-06-21 DIAGNOSIS — R1084 Generalized abdominal pain: Secondary | ICD-10-CM | POA: Diagnosis not present

## 2014-06-21 DIAGNOSIS — K811 Chronic cholecystitis: Secondary | ICD-10-CM | POA: Diagnosis not present

## 2014-06-21 DIAGNOSIS — R599 Enlarged lymph nodes, unspecified: Secondary | ICD-10-CM | POA: Diagnosis not present

## 2014-06-21 DIAGNOSIS — R52 Pain, unspecified: Secondary | ICD-10-CM | POA: Diagnosis not present

## 2014-06-22 ENCOUNTER — Ambulatory Visit: Payer: Self-pay | Admitting: Internal Medicine

## 2014-06-22 DIAGNOSIS — K819 Cholecystitis, unspecified: Secondary | ICD-10-CM | POA: Diagnosis not present

## 2014-06-22 DIAGNOSIS — Z79899 Other long term (current) drug therapy: Secondary | ICD-10-CM | POA: Diagnosis not present

## 2014-06-22 DIAGNOSIS — R599 Enlarged lymph nodes, unspecified: Secondary | ICD-10-CM | POA: Diagnosis not present

## 2014-06-22 DIAGNOSIS — E785 Hyperlipidemia, unspecified: Secondary | ICD-10-CM | POA: Diagnosis not present

## 2014-06-22 DIAGNOSIS — K219 Gastro-esophageal reflux disease without esophagitis: Secondary | ICD-10-CM | POA: Diagnosis not present

## 2014-06-22 DIAGNOSIS — M7989 Other specified soft tissue disorders: Secondary | ICD-10-CM | POA: Diagnosis not present

## 2014-06-22 DIAGNOSIS — E039 Hypothyroidism, unspecified: Secondary | ICD-10-CM | POA: Diagnosis not present

## 2014-06-22 DIAGNOSIS — Z803 Family history of malignant neoplasm of breast: Secondary | ICD-10-CM | POA: Diagnosis not present

## 2014-06-22 DIAGNOSIS — R630 Anorexia: Secondary | ICD-10-CM | POA: Diagnosis not present

## 2014-06-22 DIAGNOSIS — F17200 Nicotine dependence, unspecified, uncomplicated: Secondary | ICD-10-CM | POA: Diagnosis not present

## 2014-06-22 DIAGNOSIS — R5381 Other malaise: Secondary | ICD-10-CM | POA: Diagnosis not present

## 2014-06-22 DIAGNOSIS — Z9641 Presence of insulin pump (external) (internal): Secondary | ICD-10-CM | POA: Diagnosis not present

## 2014-06-22 DIAGNOSIS — R110 Nausea: Secondary | ICD-10-CM | POA: Diagnosis not present

## 2014-06-22 DIAGNOSIS — E119 Type 2 diabetes mellitus without complications: Secondary | ICD-10-CM | POA: Diagnosis not present

## 2014-06-22 DIAGNOSIS — R634 Abnormal weight loss: Secondary | ICD-10-CM | POA: Diagnosis not present

## 2014-06-22 LAB — BASIC METABOLIC PANEL
Anion Gap: 10 (ref 7–16)
BUN: 6 mg/dL — AB (ref 7–18)
CO2: 30 mmol/L (ref 21–32)
Calcium, Total: 8.1 mg/dL — ABNORMAL LOW (ref 8.5–10.1)
Chloride: 105 mmol/L (ref 98–107)
Creatinine: 1.24 mg/dL (ref 0.60–1.30)
EGFR (African American): >60
GLUCOSE: 101 mg/dL — AB (ref 65–99)
OSMOLALITY: 286 (ref 275–301)
Potassium: 3 mmol/L — ABNORMAL LOW (ref 3.5–5.1)
Sodium: 145 mmol/L (ref 136–145)

## 2014-06-22 LAB — MAGNESIUM: MAGNESIUM: 1.9 mg/dL

## 2014-06-28 ENCOUNTER — Ambulatory Visit: Payer: Self-pay | Admitting: Internal Medicine

## 2014-06-28 DIAGNOSIS — R599 Enlarged lymph nodes, unspecified: Secondary | ICD-10-CM | POA: Diagnosis not present

## 2014-06-28 DIAGNOSIS — R948 Abnormal results of function studies of other organs and systems: Secondary | ICD-10-CM | POA: Diagnosis not present

## 2014-06-29 ENCOUNTER — Other Ambulatory Visit: Payer: Self-pay | Admitting: Internal Medicine

## 2014-06-29 DIAGNOSIS — I2510 Atherosclerotic heart disease of native coronary artery without angina pectoris: Secondary | ICD-10-CM | POA: Diagnosis not present

## 2014-06-29 DIAGNOSIS — I10 Essential (primary) hypertension: Secondary | ICD-10-CM | POA: Diagnosis not present

## 2014-06-29 DIAGNOSIS — K219 Gastro-esophageal reflux disease without esophagitis: Secondary | ICD-10-CM | POA: Diagnosis not present

## 2014-06-29 DIAGNOSIS — I4949 Other premature depolarization: Secondary | ICD-10-CM | POA: Diagnosis not present

## 2014-07-10 ENCOUNTER — Ambulatory Visit: Payer: Self-pay | Admitting: Surgery

## 2014-07-10 LAB — CBC WITH DIFFERENTIAL/PLATELET
Basophil #: 0.1 10*3/uL (ref 0.0–0.1)
Basophil %: 2.4 %
EOS ABS: 0.1 10*3/uL (ref 0.0–0.7)
Eosinophil %: 2 %
HCT: 46.1 % (ref 40.0–52.0)
HGB: 15.4 g/dL (ref 13.0–18.0)
Lymphocyte #: 1.2 10*3/uL (ref 1.0–3.6)
Lymphocyte %: 22.8 %
MCH: 30.6 pg (ref 26.0–34.0)
MCHC: 33.5 g/dL (ref 32.0–36.0)
MCV: 91 fL (ref 80–100)
Monocyte #: 0.4 x10 3/mm (ref 0.2–1.0)
Monocyte %: 7.8 %
NEUTROS ABS: 3.3 10*3/uL (ref 1.4–6.5)
NEUTROS PCT: 65 %
Platelet: 178 10*3/uL (ref 150–440)
RBC: 5.04 10*6/uL (ref 4.40–5.90)
RDW: 13.6 % (ref 11.5–14.5)
WBC: 5.1 10*3/uL (ref 3.8–10.6)

## 2014-07-10 LAB — BASIC METABOLIC PANEL
Anion Gap: 5 — ABNORMAL LOW (ref 7–16)
BUN: 7 mg/dL (ref 7–18)
CO2: 30 mmol/L (ref 21–32)
CREATININE: 1.11 mg/dL (ref 0.60–1.30)
Calcium, Total: 8.2 mg/dL — ABNORMAL LOW (ref 8.5–10.1)
Chloride: 104 mmol/L (ref 98–107)
EGFR (Non-African Amer.): >60
GLUCOSE: 210 mg/dL — AB (ref 65–99)
Osmolality: 282 (ref 275–301)
Potassium: 4.1 mmol/L (ref 3.5–5.1)
Sodium: 139 mmol/L (ref 136–145)

## 2014-07-10 LAB — HEPATIC FUNCTION PANEL A (ARMC)
ALK PHOS: 107 U/L
Albumin: 2.9 g/dL — ABNORMAL LOW (ref 3.4–5.0)
Bilirubin, Direct: 0.2 mg/dL (ref 0.00–0.20)
Bilirubin,Total: 0.7 mg/dL (ref 0.2–1.0)
SGOT(AST): 48 U/L — ABNORMAL HIGH (ref 15–37)
SGPT (ALT): 34 U/L
Total Protein: 6.3 g/dL — ABNORMAL LOW (ref 6.4–8.2)

## 2014-07-12 ENCOUNTER — Ambulatory Visit: Payer: Self-pay | Admitting: Internal Medicine

## 2014-07-12 ENCOUNTER — Inpatient Hospital Stay: Payer: Self-pay | Admitting: Surgery

## 2014-07-12 DIAGNOSIS — F17220 Nicotine dependence, chewing tobacco, uncomplicated: Secondary | ICD-10-CM | POA: Diagnosis present

## 2014-07-12 DIAGNOSIS — F329 Major depressive disorder, single episode, unspecified: Secondary | ICD-10-CM | POA: Diagnosis present

## 2014-07-12 DIAGNOSIS — E1042 Type 1 diabetes mellitus with diabetic polyneuropathy: Secondary | ICD-10-CM | POA: Diagnosis present

## 2014-07-12 DIAGNOSIS — I9581 Postprocedural hypotension: Secondary | ICD-10-CM | POA: Diagnosis not present

## 2014-07-12 DIAGNOSIS — E1165 Type 2 diabetes mellitus with hyperglycemia: Secondary | ICD-10-CM | POA: Diagnosis not present

## 2014-07-12 DIAGNOSIS — E0590 Thyrotoxicosis, unspecified without thyrotoxic crisis or storm: Secondary | ICD-10-CM | POA: Diagnosis not present

## 2014-07-12 DIAGNOSIS — N170 Acute kidney failure with tubular necrosis: Secondary | ICD-10-CM | POA: Diagnosis not present

## 2014-07-12 DIAGNOSIS — Z794 Long term (current) use of insulin: Secondary | ICD-10-CM | POA: Diagnosis not present

## 2014-07-12 DIAGNOSIS — C8310 Mantle cell lymphoma, unspecified site: Secondary | ICD-10-CM | POA: Diagnosis present

## 2014-07-12 DIAGNOSIS — R590 Localized enlarged lymph nodes: Secondary | ICD-10-CM | POA: Diagnosis not present

## 2014-07-12 DIAGNOSIS — E039 Hypothyroidism, unspecified: Secondary | ICD-10-CM | POA: Diagnosis not present

## 2014-07-12 DIAGNOSIS — E139 Other specified diabetes mellitus without complications: Secondary | ICD-10-CM | POA: Diagnosis not present

## 2014-07-12 DIAGNOSIS — K8010 Calculus of gallbladder with chronic cholecystitis without obstruction: Secondary | ICD-10-CM | POA: Diagnosis present

## 2014-07-12 DIAGNOSIS — Z9641 Presence of insulin pump (external) (internal): Secondary | ICD-10-CM | POA: Diagnosis not present

## 2014-07-12 DIAGNOSIS — K811 Chronic cholecystitis: Secondary | ICD-10-CM | POA: Diagnosis not present

## 2014-07-12 DIAGNOSIS — I10 Essential (primary) hypertension: Secondary | ICD-10-CM | POA: Diagnosis present

## 2014-07-12 DIAGNOSIS — E109 Type 1 diabetes mellitus without complications: Secondary | ICD-10-CM | POA: Diagnosis not present

## 2014-07-12 DIAGNOSIS — E10319 Type 1 diabetes mellitus with unspecified diabetic retinopathy without macular edema: Secondary | ICD-10-CM | POA: Diagnosis present

## 2014-07-12 DIAGNOSIS — E860 Dehydration: Secondary | ICD-10-CM | POA: Diagnosis present

## 2014-07-12 DIAGNOSIS — R599 Enlarged lymph nodes, unspecified: Secondary | ICD-10-CM | POA: Diagnosis not present

## 2014-07-12 DIAGNOSIS — E785 Hyperlipidemia, unspecified: Secondary | ICD-10-CM | POA: Diagnosis not present

## 2014-07-12 DIAGNOSIS — E1065 Type 1 diabetes mellitus with hyperglycemia: Secondary | ICD-10-CM | POA: Diagnosis present

## 2014-07-12 DIAGNOSIS — I952 Hypotension due to drugs: Secondary | ICD-10-CM | POA: Diagnosis not present

## 2014-07-12 DIAGNOSIS — R634 Abnormal weight loss: Secondary | ICD-10-CM | POA: Diagnosis not present

## 2014-07-12 DIAGNOSIS — K219 Gastro-esophageal reflux disease without esophagitis: Secondary | ICD-10-CM | POA: Diagnosis present

## 2014-07-12 DIAGNOSIS — K810 Acute cholecystitis: Secondary | ICD-10-CM | POA: Diagnosis not present

## 2014-07-12 DIAGNOSIS — R000 Tachycardia, unspecified: Secondary | ICD-10-CM | POA: Diagnosis not present

## 2014-07-12 DIAGNOSIS — Z9049 Acquired absence of other specified parts of digestive tract: Secondary | ICD-10-CM | POA: Diagnosis not present

## 2014-07-12 DIAGNOSIS — K8000 Calculus of gallbladder with acute cholecystitis without obstruction: Secondary | ICD-10-CM | POA: Diagnosis not present

## 2014-07-12 DIAGNOSIS — C8315 Mantle cell lymphoma, lymph nodes of inguinal region and lower limb: Secondary | ICD-10-CM | POA: Diagnosis not present

## 2014-07-12 DIAGNOSIS — Z9889 Other specified postprocedural states: Secondary | ICD-10-CM | POA: Diagnosis not present

## 2014-07-12 HISTORY — DX: Mantle cell lymphoma, unspecified site: C83.10

## 2014-07-12 LAB — CBC WITH DIFFERENTIAL/PLATELET
BASOS ABS: 0 10*3/uL (ref 0.0–0.1)
Basophil #: 0.1 10*3/uL (ref 0.0–0.1)
Basophil %: 0.2 %
Basophil %: 0.3 %
EOS ABS: 0 10*3/uL (ref 0.0–0.7)
EOS PCT: 0 %
Eosinophil #: 0 10*3/uL (ref 0.0–0.7)
Eosinophil %: 0 %
HCT: 42.7 % (ref 40.0–52.0)
HCT: 40.3 % (ref 40.0–52.0)
HGB: 12.7 g/dL — ABNORMAL LOW (ref 13.0–18.0)
HGB: 13.5 g/dL (ref 13.0–18.0)
LYMPHS PCT: 8.2 %
Lymphocyte #: 0.3 10*3/uL — ABNORMAL LOW (ref 1.0–3.6)
Lymphocyte #: 1.4 10*3/uL (ref 1.0–3.6)
Lymphocyte %: 2.1 %
MCH: 29.7 pg (ref 26.0–34.0)
MCH: 29.8 pg (ref 26.0–34.0)
MCHC: 31.6 g/dL — AB (ref 32.0–36.0)
MCHC: 31.7 g/dL — AB (ref 32.0–36.0)
MCV: 94 fL (ref 80–100)
MCV: 94 fL (ref 80–100)
MONO ABS: 0.9 x10 3/mm (ref 0.2–1.0)
Monocyte #: 0.9 x10 3/mm (ref 0.2–1.0)
Monocyte %: 5.3 %
Monocyte %: 5.7 %
NEUTROS PCT: 86.2 %
NEUTROS PCT: 92 %
Neutrophil #: 14.4 10*3/uL — ABNORMAL HIGH (ref 1.4–6.5)
Neutrophil #: 14.9 10*3/uL — ABNORMAL HIGH (ref 1.4–6.5)
PLATELETS: 173 10*3/uL (ref 150–440)
Platelet: 188 10*3/uL (ref 150–440)
RBC: 4.28 10*6/uL — ABNORMAL LOW (ref 4.40–5.90)
RBC: 4.54 10*6/uL (ref 4.40–5.90)
RDW: 13.1 % (ref 11.5–14.5)
RDW: 13.5 % (ref 11.5–14.5)
WBC: 17.3 10*3/uL — ABNORMAL HIGH (ref 3.8–10.6)
WBC: 15.7 10*3/uL — AB (ref 3.8–10.6)

## 2014-07-12 LAB — PROTIME-INR
INR: 1.3
Prothrombin Time: 16.4 secs — ABNORMAL HIGH (ref 11.5–14.7)

## 2014-07-13 DIAGNOSIS — E109 Type 1 diabetes mellitus without complications: Secondary | ICD-10-CM | POA: Diagnosis not present

## 2014-07-13 DIAGNOSIS — I952 Hypotension due to drugs: Secondary | ICD-10-CM | POA: Diagnosis not present

## 2014-07-13 DIAGNOSIS — Z9049 Acquired absence of other specified parts of digestive tract: Secondary | ICD-10-CM | POA: Diagnosis not present

## 2014-07-13 DIAGNOSIS — E0590 Thyrotoxicosis, unspecified without thyrotoxic crisis or storm: Secondary | ICD-10-CM | POA: Diagnosis not present

## 2014-07-13 DIAGNOSIS — E1165 Type 2 diabetes mellitus with hyperglycemia: Secondary | ICD-10-CM | POA: Diagnosis not present

## 2014-07-13 DIAGNOSIS — K810 Acute cholecystitis: Secondary | ICD-10-CM | POA: Diagnosis not present

## 2014-07-13 DIAGNOSIS — Z9641 Presence of insulin pump (external) (internal): Secondary | ICD-10-CM | POA: Diagnosis not present

## 2014-07-13 LAB — CBC WITH DIFFERENTIAL/PLATELET
Basophil #: 0 10*3/uL (ref 0.0–0.1)
Basophil %: 0.1 %
Eosinophil #: 0 10*3/uL (ref 0.0–0.7)
Eosinophil %: 0 %
HCT: 39 % — ABNORMAL LOW (ref 40.0–52.0)
HGB: 12.4 g/dL — ABNORMAL LOW (ref 13.0–18.0)
Lymphocyte #: 0.5 10*3/uL — ABNORMAL LOW (ref 1.0–3.6)
Lymphocyte %: 3.9 %
MCH: 29.9 pg (ref 26.0–34.0)
MCHC: 31.7 g/dL — ABNORMAL LOW (ref 32.0–36.0)
MCV: 94 fL (ref 80–100)
Monocyte #: 0.7 x10 3/mm (ref 0.2–1.0)
Monocyte %: 5.7 %
Neutrophil #: 11.8 10*3/uL — ABNORMAL HIGH (ref 1.4–6.5)
Neutrophil %: 90.3 %
Platelet: 159 10*3/uL (ref 150–440)
RBC: 4.13 10*6/uL — ABNORMAL LOW (ref 4.40–5.90)
RDW: 13.3 % (ref 11.5–14.5)
WBC: 13 10*3/uL — ABNORMAL HIGH (ref 3.8–10.6)

## 2014-07-13 LAB — COMPREHENSIVE METABOLIC PANEL
ALBUMIN: 2.3 g/dL — AB (ref 3.4–5.0)
ALT: 35 U/L
Albumin: 2.4 g/dL — ABNORMAL LOW (ref 3.4–5.0)
Alkaline Phosphatase: 89 U/L
Alkaline Phosphatase: 93 U/L
Anion Gap: 6 — ABNORMAL LOW (ref 7–16)
Anion Gap: 8 (ref 7–16)
BILIRUBIN TOTAL: 0.7 mg/dL (ref 0.2–1.0)
BUN: 18 mg/dL (ref 7–18)
BUN: 19 mg/dL — ABNORMAL HIGH (ref 7–18)
Bilirubin,Total: 0.9 mg/dL (ref 0.2–1.0)
CALCIUM: 7 mg/dL — AB (ref 8.5–10.1)
CHLORIDE: 110 mmol/L — AB (ref 98–107)
Calcium, Total: 6.5 mg/dL — CL (ref 8.5–10.1)
Chloride: 111 mmol/L — ABNORMAL HIGH (ref 98–107)
Co2: 20 mmol/L — ABNORMAL LOW (ref 21–32)
Co2: 22 mmol/L (ref 21–32)
Creatinine: 1.89 mg/dL — ABNORMAL HIGH (ref 0.60–1.30)
Creatinine: 2 mg/dL — ABNORMAL HIGH (ref 0.60–1.30)
EGFR (African American): 43 — ABNORMAL LOW
EGFR (Non-African Amer.): 36 — ABNORMAL LOW
GFR CALC AF AMER: 46 — AB
GFR CALC NON AF AMER: 38 — AB
GLUCOSE: 398 mg/dL — AB (ref 65–99)
Glucose: 370 mg/dL — ABNORMAL HIGH (ref 65–99)
Osmolality: 295 (ref 275–301)
Osmolality: 295 (ref 275–301)
Potassium: 5.7 mmol/L — ABNORMAL HIGH (ref 3.5–5.1)
Potassium: 6 mmol/L — ABNORMAL HIGH (ref 3.5–5.1)
SGOT(AST): 60 U/L — ABNORMAL HIGH (ref 15–37)
SGOT(AST): 67 U/L — ABNORMAL HIGH (ref 15–37)
SGPT (ALT): 38 U/L
Sodium: 138 mmol/L (ref 136–145)
Sodium: 139 mmol/L (ref 136–145)
TOTAL PROTEIN: 5.2 g/dL — AB (ref 6.4–8.2)
Total Protein: 5.3 g/dL — ABNORMAL LOW (ref 6.4–8.2)

## 2014-07-13 LAB — BASIC METABOLIC PANEL
ANION GAP: 10 (ref 7–16)
BUN: 29 mg/dL — ABNORMAL HIGH (ref 7–18)
CO2: 18 mmol/L — AB (ref 21–32)
CREATININE: 2.38 mg/dL — AB (ref 0.60–1.30)
Calcium, Total: 7.3 mg/dL — ABNORMAL LOW (ref 8.5–10.1)
Chloride: 108 mmol/L — ABNORMAL HIGH (ref 98–107)
EGFR (African American): 35 — ABNORMAL LOW
GFR CALC NON AF AMER: 29 — AB
Glucose: 436 mg/dL — ABNORMAL HIGH (ref 65–99)
OSMOLALITY: 297 (ref 275–301)
Potassium: 5.8 mmol/L — ABNORMAL HIGH (ref 3.5–5.1)
Sodium: 136 mmol/L (ref 136–145)

## 2014-07-13 LAB — POTASSIUM: Potassium: 5.6 mmol/L — ABNORMAL HIGH (ref 3.5–5.1)

## 2014-07-13 LAB — TROPONIN I: Troponin-I: 0.05 ng/mL

## 2014-07-13 LAB — CK TOTAL AND CKMB (NOT AT ARMC)
CK, Total: 363 U/L — ABNORMAL HIGH
CK-MB: 3.4 ng/mL (ref 0.5–3.6)

## 2014-07-14 DIAGNOSIS — E109 Type 1 diabetes mellitus without complications: Secondary | ICD-10-CM | POA: Diagnosis not present

## 2014-07-14 DIAGNOSIS — E1165 Type 2 diabetes mellitus with hyperglycemia: Secondary | ICD-10-CM | POA: Diagnosis not present

## 2014-07-14 DIAGNOSIS — E0590 Thyrotoxicosis, unspecified without thyrotoxic crisis or storm: Secondary | ICD-10-CM | POA: Diagnosis not present

## 2014-07-14 DIAGNOSIS — Z9641 Presence of insulin pump (external) (internal): Secondary | ICD-10-CM | POA: Diagnosis not present

## 2014-07-14 DIAGNOSIS — I952 Hypotension due to drugs: Secondary | ICD-10-CM | POA: Diagnosis not present

## 2014-07-14 DIAGNOSIS — K810 Acute cholecystitis: Secondary | ICD-10-CM | POA: Diagnosis not present

## 2014-07-14 DIAGNOSIS — Z9049 Acquired absence of other specified parts of digestive tract: Secondary | ICD-10-CM | POA: Diagnosis not present

## 2014-07-14 LAB — CBC WITH DIFFERENTIAL/PLATELET
Basophil #: 0 10*3/uL (ref 0.0–0.1)
Basophil %: 0.1 %
EOS ABS: 0 10*3/uL (ref 0.0–0.7)
EOS PCT: 0.2 %
HCT: 35.6 % — ABNORMAL LOW (ref 40.0–52.0)
HGB: 12.1 g/dL — AB (ref 13.0–18.0)
Lymphocyte #: 0.7 10*3/uL — ABNORMAL LOW (ref 1.0–3.6)
Lymphocyte %: 7.4 %
MCH: 31.2 pg (ref 26.0–34.0)
MCHC: 33.9 g/dL (ref 32.0–36.0)
MCV: 92 fL (ref 80–100)
MONOS PCT: 7 %
Monocyte #: 0.7 x10 3/mm (ref 0.2–1.0)
NEUTROS ABS: 8.4 10*3/uL — AB (ref 1.4–6.5)
Neutrophil %: 85.3 %
Platelet: 142 10*3/uL — ABNORMAL LOW (ref 150–440)
RBC: 3.86 10*6/uL — ABNORMAL LOW (ref 4.40–5.90)
RDW: 13.5 % (ref 11.5–14.5)
WBC: 9.9 10*3/uL (ref 3.8–10.6)

## 2014-07-14 LAB — BASIC METABOLIC PANEL
Anion Gap: 5 — ABNORMAL LOW (ref 7–16)
BUN: 26 mg/dL — AB (ref 7–18)
Calcium, Total: 7.3 mg/dL — ABNORMAL LOW (ref 8.5–10.1)
Chloride: 114 mmol/L — ABNORMAL HIGH (ref 98–107)
Co2: 23 mmol/L (ref 21–32)
Creatinine: 1.5 mg/dL — ABNORMAL HIGH (ref 0.60–1.30)
EGFR (Non-African Amer.): 50 — ABNORMAL LOW
Glucose: 72 mg/dL (ref 65–99)
Osmolality: 286 (ref 275–301)
Potassium: 4.5 mmol/L (ref 3.5–5.1)
Sodium: 142 mmol/L (ref 136–145)

## 2014-07-14 LAB — COMPREHENSIVE METABOLIC PANEL
ALK PHOS: 79 U/L
ALT: 24 U/L
Albumin: 2.2 g/dL — ABNORMAL LOW (ref 3.4–5.0)
Bilirubin,Total: 0.5 mg/dL (ref 0.2–1.0)
SGOT(AST): 55 U/L — ABNORMAL HIGH (ref 15–37)
TOTAL PROTEIN: 5 g/dL — AB (ref 6.4–8.2)

## 2014-07-15 DIAGNOSIS — K810 Acute cholecystitis: Secondary | ICD-10-CM | POA: Diagnosis not present

## 2014-07-15 DIAGNOSIS — Z9641 Presence of insulin pump (external) (internal): Secondary | ICD-10-CM | POA: Diagnosis not present

## 2014-07-15 DIAGNOSIS — Z9049 Acquired absence of other specified parts of digestive tract: Secondary | ICD-10-CM | POA: Diagnosis not present

## 2014-07-15 DIAGNOSIS — E109 Type 1 diabetes mellitus without complications: Secondary | ICD-10-CM | POA: Diagnosis not present

## 2014-07-15 LAB — BASIC METABOLIC PANEL
Anion Gap: 4 — ABNORMAL LOW (ref 7–16)
BUN: 17 mg/dL (ref 7–18)
CALCIUM: 7.1 mg/dL — AB (ref 8.5–10.1)
CHLORIDE: 110 mmol/L — AB (ref 98–107)
CO2: 24 mmol/L (ref 21–32)
Creatinine: 1.02 mg/dL (ref 0.60–1.30)
EGFR (African American): >60
EGFR (Non-African Amer.): >60
Glucose: 145 mg/dL — ABNORMAL HIGH (ref 65–99)
OSMOLALITY: 280 (ref 275–301)
Potassium: 4.5 mmol/L (ref 3.5–5.1)
Sodium: 138 mmol/L (ref 136–145)

## 2014-07-17 DIAGNOSIS — Z9641 Presence of insulin pump (external) (internal): Secondary | ICD-10-CM | POA: Diagnosis not present

## 2014-07-17 DIAGNOSIS — Z9049 Acquired absence of other specified parts of digestive tract: Secondary | ICD-10-CM | POA: Diagnosis not present

## 2014-07-17 DIAGNOSIS — K810 Acute cholecystitis: Secondary | ICD-10-CM | POA: Diagnosis not present

## 2014-07-17 DIAGNOSIS — E109 Type 1 diabetes mellitus without complications: Secondary | ICD-10-CM | POA: Diagnosis not present

## 2014-07-19 DIAGNOSIS — R634 Abnormal weight loss: Secondary | ICD-10-CM | POA: Diagnosis not present

## 2014-07-19 DIAGNOSIS — C8315 Mantle cell lymphoma, lymph nodes of inguinal region and lower limb: Secondary | ICD-10-CM | POA: Diagnosis not present

## 2014-07-19 DIAGNOSIS — Z9889 Other specified postprocedural states: Secondary | ICD-10-CM | POA: Diagnosis not present

## 2014-07-26 LAB — PATHOLOGY REPORT

## 2014-07-27 ENCOUNTER — Ambulatory Visit: Payer: Self-pay | Admitting: Internal Medicine

## 2014-07-27 DIAGNOSIS — Z79899 Other long term (current) drug therapy: Secondary | ICD-10-CM | POA: Diagnosis not present

## 2014-07-27 DIAGNOSIS — C8315 Mantle cell lymphoma, lymph nodes of inguinal region and lower limb: Secondary | ICD-10-CM | POA: Diagnosis not present

## 2014-07-27 DIAGNOSIS — E119 Type 2 diabetes mellitus without complications: Secondary | ICD-10-CM | POA: Diagnosis not present

## 2014-07-27 DIAGNOSIS — I501 Left ventricular failure: Secondary | ICD-10-CM | POA: Diagnosis not present

## 2014-07-27 DIAGNOSIS — K219 Gastro-esophageal reflux disease without esophagitis: Secondary | ICD-10-CM | POA: Diagnosis not present

## 2014-07-27 DIAGNOSIS — I10 Essential (primary) hypertension: Secondary | ICD-10-CM | POA: Diagnosis not present

## 2014-07-27 DIAGNOSIS — E0590 Thyrotoxicosis, unspecified without thyrotoxic crisis or storm: Secondary | ICD-10-CM | POA: Diagnosis not present

## 2014-07-27 DIAGNOSIS — R630 Anorexia: Secondary | ICD-10-CM | POA: Diagnosis not present

## 2014-07-27 DIAGNOSIS — Z807 Family history of other malignant neoplasms of lymphoid, hematopoietic and related tissues: Secondary | ICD-10-CM | POA: Diagnosis not present

## 2014-07-27 DIAGNOSIS — Z7982 Long term (current) use of aspirin: Secondary | ICD-10-CM | POA: Diagnosis not present

## 2014-07-27 DIAGNOSIS — Z803 Family history of malignant neoplasm of breast: Secondary | ICD-10-CM | POA: Diagnosis not present

## 2014-07-27 DIAGNOSIS — Z720 Tobacco use: Secondary | ICD-10-CM | POA: Diagnosis not present

## 2014-07-27 DIAGNOSIS — Z9049 Acquired absence of other specified parts of digestive tract: Secondary | ICD-10-CM | POA: Diagnosis not present

## 2014-07-27 DIAGNOSIS — R634 Abnormal weight loss: Secondary | ICD-10-CM | POA: Diagnosis not present

## 2014-07-27 DIAGNOSIS — Z1159 Encounter for screening for other viral diseases: Secondary | ICD-10-CM | POA: Diagnosis not present

## 2014-07-27 DIAGNOSIS — Z794 Long term (current) use of insulin: Secondary | ICD-10-CM | POA: Diagnosis not present

## 2014-07-27 LAB — CBC CANCER CENTER
BASOS PCT: 1.6 %
Basophil #: 0.1 x10 3/mm (ref 0.0–0.1)
EOS ABS: 0.1 x10 3/mm (ref 0.0–0.7)
EOS PCT: 1.3 %
HCT: 39.3 % — ABNORMAL LOW (ref 40.0–52.0)
HGB: 13.1 g/dL (ref 13.0–18.0)
Lymphocyte #: 1.4 x10 3/mm (ref 1.0–3.6)
Lymphocyte %: 19.7 %
MCH: 30 pg (ref 26.0–34.0)
MCHC: 33.3 g/dL (ref 32.0–36.0)
MCV: 90 fL (ref 80–100)
Monocyte #: 0.5 x10 3/mm (ref 0.2–1.0)
Monocyte %: 7.5 %
Neutrophil #: 5 x10 3/mm (ref 1.4–6.5)
Neutrophil %: 69.9 %
PLATELETS: 239 x10 3/mm (ref 150–440)
RBC: 4.36 10*6/uL — ABNORMAL LOW (ref 4.40–5.90)
RDW: 13.9 % (ref 11.5–14.5)
WBC: 7.2 x10 3/mm (ref 3.8–10.6)

## 2014-07-27 LAB — HEPATIC FUNCTION PANEL A (ARMC)
AST: 19 U/L (ref 15–37)
Albumin: 2.6 g/dL — ABNORMAL LOW (ref 3.4–5.0)
Alkaline Phosphatase: 120 U/L — ABNORMAL HIGH
Bilirubin, Direct: 0.2 mg/dL (ref 0.00–0.20)
Bilirubin,Total: 0.8 mg/dL (ref 0.2–1.0)
SGPT (ALT): 17 U/L
Total Protein: 6.2 g/dL — ABNORMAL LOW (ref 6.4–8.2)

## 2014-07-27 LAB — LACTATE DEHYDROGENASE: LDH: 228 U/L (ref 85–241)

## 2014-07-30 LAB — PATHOLOGY REPORT

## 2014-07-31 DIAGNOSIS — E780 Pure hypercholesterolemia: Secondary | ICD-10-CM | POA: Diagnosis not present

## 2014-07-31 DIAGNOSIS — E10649 Type 1 diabetes mellitus with hypoglycemia without coma: Secondary | ICD-10-CM | POA: Diagnosis not present

## 2014-07-31 DIAGNOSIS — Z4681 Encounter for fitting and adjustment of insulin pump: Secondary | ICD-10-CM | POA: Diagnosis not present

## 2014-07-31 DIAGNOSIS — E0500 Thyrotoxicosis with diffuse goiter without thyrotoxic crisis or storm: Secondary | ICD-10-CM | POA: Diagnosis not present

## 2014-08-03 ENCOUNTER — Encounter: Payer: Self-pay | Admitting: Internal Medicine

## 2014-08-03 ENCOUNTER — Ambulatory Visit (INDEPENDENT_AMBULATORY_CARE_PROVIDER_SITE_OTHER): Payer: Medicare Other | Admitting: Internal Medicine

## 2014-08-03 ENCOUNTER — Ambulatory Visit: Payer: Self-pay | Admitting: Internal Medicine

## 2014-08-03 VITALS — BP 120/70 | HR 102 | Temp 97.7°F | Ht 70.25 in | Wt 220.5 lb

## 2014-08-03 DIAGNOSIS — F329 Major depressive disorder, single episode, unspecified: Secondary | ICD-10-CM

## 2014-08-03 DIAGNOSIS — E119 Type 2 diabetes mellitus without complications: Secondary | ICD-10-CM | POA: Diagnosis not present

## 2014-08-03 DIAGNOSIS — E1151 Type 2 diabetes mellitus with diabetic peripheral angiopathy without gangrene: Secondary | ICD-10-CM | POA: Diagnosis not present

## 2014-08-03 DIAGNOSIS — L7621 Postprocedural hemorrhage and hematoma of skin and subcutaneous tissue following a dermatologic procedure: Secondary | ICD-10-CM | POA: Diagnosis not present

## 2014-08-03 DIAGNOSIS — I10 Essential (primary) hypertension: Secondary | ICD-10-CM

## 2014-08-03 DIAGNOSIS — E079 Disorder of thyroid, unspecified: Secondary | ICD-10-CM | POA: Diagnosis not present

## 2014-08-03 DIAGNOSIS — C8590 Non-Hodgkin lymphoma, unspecified, unspecified site: Secondary | ICD-10-CM | POA: Diagnosis not present

## 2014-08-03 DIAGNOSIS — F439 Reaction to severe stress, unspecified: Secondary | ICD-10-CM

## 2014-08-03 DIAGNOSIS — I890 Lymphedema, not elsewhere classified: Secondary | ICD-10-CM | POA: Diagnosis not present

## 2014-08-03 DIAGNOSIS — Z658 Other specified problems related to psychosocial circumstances: Secondary | ICD-10-CM | POA: Diagnosis not present

## 2014-08-03 DIAGNOSIS — M7989 Other specified soft tissue disorders: Secondary | ICD-10-CM | POA: Diagnosis not present

## 2014-08-03 DIAGNOSIS — F32A Depression, unspecified: Secondary | ICD-10-CM

## 2014-08-03 DIAGNOSIS — K219 Gastro-esophageal reflux disease without esophagitis: Secondary | ICD-10-CM

## 2014-08-03 DIAGNOSIS — C8310 Mantle cell lymphoma, unspecified site: Secondary | ICD-10-CM | POA: Insufficient documentation

## 2014-08-03 MED ORDER — CEPHALEXIN 500 MG PO CAPS: 500.0000 mg | ORAL_CAPSULE | Freq: Three times a day (TID) | ORAL | Status: DC

## 2014-08-03 NOTE — Assessment & Plan Note (Signed)
Blood pressure doing well.  Continue current medication regimen.  Follow metabolic panel.

## 2014-08-03 NOTE — Assessment & Plan Note (Signed)
Has an insulin pump now.  Sees Dr Eddie Dibbles.  Just had settings adjusted.

## 2014-08-03 NOTE — Assessment & Plan Note (Signed)
On protonix and carafate.  Doing well.  Previously saw GI.  Had recent EGD and colonoscopy.

## 2014-08-03 NOTE — Assessment & Plan Note (Signed)
Recently diagnosed with mantle cell lymphoma.  Seeing Dr Cynda Acres.  Discussing treatment options.

## 2014-08-03 NOTE — Assessment & Plan Note (Signed)
Increased swelling and redness left lower extremity.  Need lower extremity ultrasound to rule out DVT.  Recently diagnosed with lymphoma.  Also since I cannot appreciate DP pulse, will have vascular surgery evaluate.

## 2014-08-03 NOTE — Progress Notes (Signed)
Subjective:    [name], male    DOB: April 05, 1946, 68 y.o.   MRN: [id number]  HPI 68 year old male with past history of diabetes, hypertension, hypercholesterolemia and thyroid disease.  He is followed by Dr Eddie Dibbles for his diabetes and thyroid issues.  He now has an insulin pump.  His settings were recently decreased.  He comes in today for a scheduled follow up.  Was admitted recently and is now s/p cholecystectomy.  CT revealed diffuse lymphadenopathy.  Inguinal biopsy - mantle cell lymphoma. Just saw Dr Cynda Acres.  Deciding treatment.  Has not started chemo. States his appetite has improved.  Eating better now.  Bowels stable.  No further abdominal pain.  Saw cardiology recently.  States stable.   Bowels stable.   Increased stress with his family situation.  His son is still in the house.  Does not work.  Increased stress on both the patient and his wife.  We discussed this at length.  Discussed restarting an antidepressant and counseling.  He agrees to counseling.  Desires to see Dr Bridgett Larsson.  Again discussed the possibility of moving.  He is looking in to his options.    He does report increased lower extremity swelling.  Left leg with increased swelling and redness.  States has been present since hospitalization.  Some better per his report.  No known injury.     Past Medical History  Diagnosis Date  . Peripheral neuropathy   . Hypertension   . 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 Hypercholesterolemia   . Gastroparesis   . Graves disease   . Depression     Outpatient Encounter Prescriptions as of 08/03/2014  Medication Sig  . fluticasone (FLOVENT HFA) 110 MCG/ACT inhaler Inhale 2 puffs into the lungs 2 (two) times daily.  . insulin aspart (NOVOLOG) 100 UNIT/ML injection Use as directed  . lisinopril (PRINIVIL,ZESTRIL) 20 MG tablet TAKE 1 TABLET (20 MG TOTAL) BY MOUTH AT BEDTIME.  . methimazole (TAPAZOLE) 10 MG tablet Take  20 mg by mouth 2 (two) times daily.  . metoCLOPramide (REGLAN) 10 MG tablet TAKE 1 TABLET (10 MG TOTAL) BY MOUTH AT BEDTIME.  . pantoprazole (PROTONIX) 40 MG tablet TAKE 1 TABLET (40 MG TOTAL) BY MOUTH DAILY.  . pravastatin (PRAVACHOL) 40 MG tablet TAKE 1 TABLET (40 MG TOTAL) BY MOUTH AT BEDTIME.  . sucralfate (CARAFATE) 1 G tablet TAKE 1 TABLET BY MOUTH 4 TIMES A DAY BEFORE MEALS AND AT BEDTIME FOR 30 DAYS  . cephALEXin (KEFLEX) 500 MG capsule Take 1 capsule (500 mg total) by mouth 3 (three) times daily.    Review of Systems Patient denies any headache, lightheadedness or dizziness.  No sinus or allergy symptoms.    No increased sob or chest pain reported.  No vomiting.  No BRBPR or melana.  No bowel change.   No urine change.  Seeing Dr Eddie Dibbles for his diabetes.  She just adjusted settings.  Appetite has improved.  Eating better.  Increased stress as outlined.  Left leg swelling as outlined.       Objective:   Physical Exam  Filed Vitals:   08/03/14 0812  BP: 120/70  Pulse: 102  Temp: 97.7 F (102.1 C)   68 year old male in no acute distress.  HEENT:  Nares - clear.  Oropharynx - without lesions. NECK:  Supple.  Nontender.  No audible carotid bruit.  HEART:  Appears to be  regular with premature beats.   LUNGS:  No crackles or wheezing audible.  Respirations even and unlabored.   RADIAL PULSE:  Equal bilaterally.  ABDOMEN:  Soft.  Nontender.  Bowel sounds present and normal.  No audible abdominal bruit.  Healing incision site.  Steri strips still in place.   Left groin: incision - some soft tissue fullness surrounding incision site.   EXTREMITIES:  Increased left lower extremity edema - pitting with erythema extending up mid lower leg.  Cannot appreciate DP pulse - bilateral.       Assessment & Plan:  HEALTH MAINTENANCE.  Physical 06/20/13.   Saw GI previously.  Just had colonoscopy and EGD.      Problem List Items Addressed This Visit   Diabetes     Has an insulin pump now.  Sees  Dr Eddie Dibbles.  Just had settings adjusted.       Essential hypertension, benign     Blood pressure doing well.  Continue current medication regimen.  Follow metabolic panel.      GERD (gastroesophageal reflux disease)     On protonix and carafate.  Doing well.  Previously saw GI.  Had recent EGD and colonoscopy.         Lymphoma     Recently diagnosed with mantle cell lymphoma.  Seeing Dr Cynda Acres.  Discussing treatment options.       Stress     Discussed at length with him today.  Discussed his home situation.  Discussed the possibility of moving.  He is looking into his options.  Will refer to psych for further evaluation and treatment.  He is in agreement.       Swelling of left lower extremity     Increased swelling and redness left lower extremity.  Need lower extremity ultrasound to rule out DVT.  Recently diagnosed with lymphoma.  Also since I cannot appreciate DP pulse, will have vascular surgery evaluate.       Thyroid disease     Followed by Dr Eddie Dibbles.  On Tapazole 1/2 (10mg ) q day.        Other Visit Diagnoses   Swelling of lower extremity    -  Primary    Relevant Orders       Lower Extremity Venous Duplex Left    Depression        Relevant Orders       Ambulatory referral to Psychiatry       I spent 25 minutes with the patient and more than 50% of the time was spent in consultation regarding the above. (specifically discussion the increased stress, sugars, etc).

## 2014-08-03 NOTE — Assessment & Plan Note (Signed)
Followed by Dr Eddie Dibbles.  On Tapazole 1/2 (10mg ) q day.

## 2014-08-03 NOTE — Assessment & Plan Note (Signed)
Discussed at length with him today.  Discussed his home situation.  Discussed the possibility of moving.  He is looking into his options.  Will refer to psych for further evaluation and treatment.  He is in agreement.

## 2014-08-03 NOTE — Progress Notes (Signed)
Pre visit review using our clinic review tool, if applicable. No additional management support is needed unless otherwise documented below in the visit note. 

## 2014-08-04 DIAGNOSIS — R001 Bradycardia, unspecified: Secondary | ICD-10-CM | POA: Diagnosis not present

## 2014-08-06 ENCOUNTER — Telehealth: Payer: Self-pay

## 2014-08-06 DIAGNOSIS — I10 Essential (primary) hypertension: Secondary | ICD-10-CM | POA: Diagnosis not present

## 2014-08-06 DIAGNOSIS — I2510 Atherosclerotic heart disease of native coronary artery without angina pectoris: Secondary | ICD-10-CM | POA: Diagnosis not present

## 2014-08-06 DIAGNOSIS — R001 Bradycardia, unspecified: Secondary | ICD-10-CM | POA: Diagnosis not present

## 2014-08-06 DIAGNOSIS — I493 Ventricular premature depolarization: Secondary | ICD-10-CM | POA: Diagnosis not present

## 2014-08-06 NOTE — Telephone Encounter (Signed)
office note placed in green folder for review

## 2014-08-06 NOTE — Telephone Encounter (Signed)
Reviewed office note.  Documented pulse rate in the 40s.  He sees Dr Nehemiah Massed.  To save him two trips, cardiology can see him today at 3:15 for work in for low pulse rate.  Please call him and see if he can go to appt for evaluation.  That way if needs holter, will be able to place it there.  I will put the office note from surgery back in you basket to send and also send my last office note.  Thanks.  If unable to make will need to let cardiology know and schedule asap.  Needs to go today.

## 2014-08-06 NOTE — Telephone Encounter (Signed)
Please advise 

## 2014-08-06 NOTE — Telephone Encounter (Signed)
A CMA from [facility] called and wanted to inform Dr.Scott that this morning the patient's pulse rate was 43. She stated she kept the pulse ox on the patient for 15 min (starting at 9:15am), however, the patient's pulse did not reach about 43.  She stated the patient is a-symptomatic, however, she wanted to give this information to Dr.Scott.  (his pulse was slightly above 100 on Friday during his visit here, at [place])

## 2014-08-06 NOTE — Telephone Encounter (Signed)
Spoke with pt & he states that he will go today @ 3:15

## 2014-08-10 DIAGNOSIS — C8315 Mantle cell lymphoma, lymph nodes of inguinal region and lower limb: Secondary | ICD-10-CM | POA: Diagnosis not present

## 2014-08-10 DIAGNOSIS — Z1159 Encounter for screening for other viral diseases: Secondary | ICD-10-CM | POA: Diagnosis not present

## 2014-08-10 DIAGNOSIS — I501 Left ventricular failure: Secondary | ICD-10-CM | POA: Diagnosis not present

## 2014-08-10 DIAGNOSIS — R634 Abnormal weight loss: Secondary | ICD-10-CM | POA: Diagnosis not present

## 2014-08-10 DIAGNOSIS — R630 Anorexia: Secondary | ICD-10-CM | POA: Diagnosis not present

## 2014-08-10 DIAGNOSIS — I10 Essential (primary) hypertension: Secondary | ICD-10-CM | POA: Diagnosis not present

## 2014-08-12 ENCOUNTER — Ambulatory Visit: Payer: Self-pay | Admitting: Internal Medicine

## 2014-08-12 DIAGNOSIS — Z803 Family history of malignant neoplasm of breast: Secondary | ICD-10-CM | POA: Diagnosis not present

## 2014-08-12 DIAGNOSIS — Z79899 Other long term (current) drug therapy: Secondary | ICD-10-CM | POA: Diagnosis not present

## 2014-08-12 DIAGNOSIS — E119 Type 2 diabetes mellitus without complications: Secondary | ICD-10-CM | POA: Diagnosis not present

## 2014-08-12 DIAGNOSIS — E0590 Thyrotoxicosis, unspecified without thyrotoxic crisis or storm: Secondary | ICD-10-CM | POA: Diagnosis not present

## 2014-08-12 DIAGNOSIS — C8315 Mantle cell lymphoma, lymph nodes of inguinal region and lower limb: Secondary | ICD-10-CM | POA: Diagnosis not present

## 2014-08-12 DIAGNOSIS — Z7982 Long term (current) use of aspirin: Secondary | ICD-10-CM | POA: Diagnosis not present

## 2014-08-12 DIAGNOSIS — K219 Gastro-esophageal reflux disease without esophagitis: Secondary | ICD-10-CM | POA: Diagnosis not present

## 2014-08-12 DIAGNOSIS — I10 Essential (primary) hypertension: Secondary | ICD-10-CM | POA: Diagnosis not present

## 2014-08-12 DIAGNOSIS — R634 Abnormal weight loss: Secondary | ICD-10-CM | POA: Diagnosis not present

## 2014-08-13 DIAGNOSIS — I10 Essential (primary) hypertension: Secondary | ICD-10-CM | POA: Diagnosis not present

## 2014-08-13 DIAGNOSIS — I493 Ventricular premature depolarization: Secondary | ICD-10-CM | POA: Diagnosis not present

## 2014-08-13 DIAGNOSIS — E782 Mixed hyperlipidemia: Secondary | ICD-10-CM | POA: Diagnosis not present

## 2014-08-13 DIAGNOSIS — I2510 Atherosclerotic heart disease of native coronary artery without angina pectoris: Secondary | ICD-10-CM | POA: Diagnosis not present

## 2014-08-21 DIAGNOSIS — E0500 Thyrotoxicosis with diffuse goiter without thyrotoxic crisis or storm: Secondary | ICD-10-CM | POA: Diagnosis not present

## 2014-08-21 DIAGNOSIS — E109 Type 1 diabetes mellitus without complications: Secondary | ICD-10-CM | POA: Diagnosis not present

## 2014-08-28 DIAGNOSIS — Z4681 Encounter for fitting and adjustment of insulin pump: Secondary | ICD-10-CM | POA: Diagnosis not present

## 2014-08-28 DIAGNOSIS — E785 Hyperlipidemia, unspecified: Secondary | ICD-10-CM | POA: Diagnosis not present

## 2014-08-28 DIAGNOSIS — E0500 Thyrotoxicosis with diffuse goiter without thyrotoxic crisis or storm: Secondary | ICD-10-CM | POA: Diagnosis not present

## 2014-08-28 DIAGNOSIS — E10649 Type 1 diabetes mellitus with hypoglycemia without coma: Secondary | ICD-10-CM | POA: Diagnosis not present

## 2014-08-31 ENCOUNTER — Encounter: Payer: Self-pay | Admitting: Internal Medicine

## 2014-09-03 DIAGNOSIS — R634 Abnormal weight loss: Secondary | ICD-10-CM | POA: Diagnosis not present

## 2014-09-03 DIAGNOSIS — C8315 Mantle cell lymphoma, lymph nodes of inguinal region and lower limb: Secondary | ICD-10-CM | POA: Diagnosis not present

## 2014-09-03 LAB — CBC CANCER CENTER
BASOS PCT: 1.7 %
Basophil #: 0.1 x10 3/mm (ref 0.0–0.1)
EOS ABS: 0.1 x10 3/mm (ref 0.0–0.7)
Eosinophil %: 2.2 %
HCT: 44 % (ref 40.0–52.0)
HGB: 14.4 g/dL (ref 13.0–18.0)
LYMPHS PCT: 29.5 %
Lymphocyte #: 1.5 x10 3/mm (ref 1.0–3.6)
MCH: 29.2 pg (ref 26.0–34.0)
MCHC: 32.8 g/dL (ref 32.0–36.0)
MCV: 89 fL (ref 80–100)
MONO ABS: 0.3 x10 3/mm (ref 0.2–1.0)
Monocyte %: 6.3 %
NEUTROS ABS: 3.1 x10 3/mm (ref 1.4–6.5)
Neutrophil %: 60.3 %
PLATELETS: 181 x10 3/mm (ref 150–440)
RBC: 4.94 10*6/uL (ref 4.40–5.90)
RDW: 13.9 % (ref 11.5–14.5)
WBC: 5.2 x10 3/mm (ref 3.8–10.6)

## 2014-09-03 LAB — CREATININE, SERUM
Creatinine: 0.89 mg/dL (ref 0.60–1.30)
EGFR (Non-African Amer.): >60

## 2014-09-03 LAB — URIC ACID: Uric Acid: 3.8 mg/dL (ref 3.5–7.2)

## 2014-09-03 LAB — LACTATE DEHYDROGENASE: LDH: 186 U/L (ref 85–241)

## 2014-09-05 DIAGNOSIS — F4321 Adjustment disorder with depressed mood: Secondary | ICD-10-CM | POA: Diagnosis not present

## 2014-09-11 ENCOUNTER — Ambulatory Visit: Payer: Self-pay | Admitting: Internal Medicine

## 2014-09-11 DIAGNOSIS — Z794 Long term (current) use of insulin: Secondary | ICD-10-CM | POA: Diagnosis not present

## 2014-09-11 DIAGNOSIS — Z9641 Presence of insulin pump (external) (internal): Secondary | ICD-10-CM | POA: Diagnosis not present

## 2014-09-11 DIAGNOSIS — I10 Essential (primary) hypertension: Secondary | ICD-10-CM | POA: Diagnosis not present

## 2014-09-11 DIAGNOSIS — C8315 Mantle cell lymphoma, lymph nodes of inguinal region and lower limb: Secondary | ICD-10-CM | POA: Diagnosis not present

## 2014-09-11 DIAGNOSIS — E119 Type 2 diabetes mellitus without complications: Secondary | ICD-10-CM | POA: Diagnosis not present

## 2014-09-11 DIAGNOSIS — Z807 Family history of other malignant neoplasms of lymphoid, hematopoietic and related tissues: Secondary | ICD-10-CM | POA: Diagnosis not present

## 2014-09-11 DIAGNOSIS — R609 Edema, unspecified: Secondary | ICD-10-CM | POA: Diagnosis not present

## 2014-09-11 DIAGNOSIS — E0590 Thyrotoxicosis, unspecified without thyrotoxic crisis or storm: Secondary | ICD-10-CM | POA: Diagnosis not present

## 2014-09-11 DIAGNOSIS — Z87891 Personal history of nicotine dependence: Secondary | ICD-10-CM | POA: Diagnosis not present

## 2014-09-11 DIAGNOSIS — R5383 Other fatigue: Secondary | ICD-10-CM | POA: Diagnosis not present

## 2014-09-11 DIAGNOSIS — E785 Hyperlipidemia, unspecified: Secondary | ICD-10-CM | POA: Diagnosis not present

## 2014-09-11 DIAGNOSIS — K219 Gastro-esophageal reflux disease without esophagitis: Secondary | ICD-10-CM | POA: Diagnosis not present

## 2014-09-11 DIAGNOSIS — Z79899 Other long term (current) drug therapy: Secondary | ICD-10-CM | POA: Diagnosis not present

## 2014-09-11 DIAGNOSIS — Z7982 Long term (current) use of aspirin: Secondary | ICD-10-CM | POA: Diagnosis not present

## 2014-09-20 DIAGNOSIS — F4321 Adjustment disorder with depressed mood: Secondary | ICD-10-CM | POA: Diagnosis not present

## 2014-09-21 ENCOUNTER — Encounter: Payer: Self-pay | Admitting: Internal Medicine

## 2014-09-21 ENCOUNTER — Encounter (INDEPENDENT_AMBULATORY_CARE_PROVIDER_SITE_OTHER): Payer: Self-pay

## 2014-09-21 ENCOUNTER — Ambulatory Visit (INDEPENDENT_AMBULATORY_CARE_PROVIDER_SITE_OTHER): Payer: Medicare Other | Admitting: Internal Medicine

## 2014-09-21 VITALS — BP 130/60 | HR 54 | Temp 98.1°F | Ht 70.25 in | Wt 213.8 lb

## 2014-09-21 DIAGNOSIS — C8590 Non-Hodgkin lymphoma, unspecified, unspecified site: Secondary | ICD-10-CM

## 2014-09-21 DIAGNOSIS — I10 Essential (primary) hypertension: Secondary | ICD-10-CM | POA: Diagnosis not present

## 2014-09-21 DIAGNOSIS — K219 Gastro-esophageal reflux disease without esophagitis: Secondary | ICD-10-CM | POA: Diagnosis not present

## 2014-09-21 DIAGNOSIS — E7800 Pure hypercholesterolemia, unspecified: Secondary | ICD-10-CM

## 2014-09-21 DIAGNOSIS — E1151 Type 2 diabetes mellitus with diabetic peripheral angiopathy without gangrene: Secondary | ICD-10-CM | POA: Diagnosis not present

## 2014-09-21 DIAGNOSIS — Z658 Other specified problems related to psychosocial circumstances: Secondary | ICD-10-CM | POA: Diagnosis not present

## 2014-09-21 DIAGNOSIS — Z86010 Personal history of colonic polyps: Secondary | ICD-10-CM

## 2014-09-21 DIAGNOSIS — E079 Disorder of thyroid, unspecified: Secondary | ICD-10-CM | POA: Diagnosis not present

## 2014-09-21 DIAGNOSIS — F439 Reaction to severe stress, unspecified: Secondary | ICD-10-CM

## 2014-09-21 NOTE — Progress Notes (Signed)
Subjective:    [name], male    DOB: 1946/06/12, 68 y.o.   MRN: [id number]  HPI 68 year old male with past history of diabetes, hypertension, hypercholesterolemia and thyroid disease.  He is followed by Dr Eddie Dibbles for his diabetes and thyroid issues.  He now has an insulin pump.  His settings were recently decreased.  He comes in today for a scheduled follow up.  Was admitted recently and is now s/p cholecystectomy.  CT revealed diffuse lymphadenopathy.  Inguinal biopsy - mantle cell lymphoma. Seeing Dr Cynda Acres.  Deciding treatment.  Bowels stable.  Saw cardiology recently.  States holter revealed a slight arrhythmia.  No changes made.  Feels stable from a cardiac standpoint.  Bowels stable.   Increased stress with his family situation.  His son is still in the house.  Does not work.  Increased stress on both the patient and his wife.  We have discussed this at length. Seeing psychiatry.  On prozac.  She recently increased his prozac to 40mg  q day.   Again discussed the possibility of moving.  He is looking in to his options.  Overall feels better.   Lower extremity swelling has improved.     Past Medical History  Diagnosis Date  . Peripheral neuropathy   . Hypertension   . 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 Hypercholesterolemia   . Gastroparesis   . Graves disease   . Depression     Outpatient Encounter Prescriptions as of 09/21/2014  Medication Sig  . aspirin EC 325 MG tablet Take by mouth.  Marland Kitchen FLUoxetine (PROZAC) 20 MG capsule Take 20 mg by mouth daily.  . fluticasone (FLOVENT HFA) 110 MCG/ACT inhaler Inhale 2 puffs into the lungs 2 (two) times daily.  . insulin aspart (NOVOLOG) 100 UNIT/ML injection Use as directed  . lisinopril (PRINIVIL,ZESTRIL) 20 MG tablet TAKE 1 TABLET (20 MG TOTAL) BY MOUTH AT BEDTIME.  . methimazole (TAPAZOLE) 10 MG tablet Take 20 mg by mouth 2 (two) times daily.  . metoCLOPramide (REGLAN)  10 MG tablet TAKE 1 TABLET (10 MG TOTAL) BY MOUTH AT BEDTIME.  . pantoprazole (PROTONIX) 40 MG tablet TAKE 1 TABLET (40 MG TOTAL) BY MOUTH DAILY.  . pravastatin (PRAVACHOL) 40 MG tablet TAKE 1 TABLET (40 MG TOTAL) BY MOUTH AT BEDTIME.  . sucralfate (CARAFATE) 1 G tablet TAKE 1 TABLET BY MOUTH 4 TIMES A DAY BEFORE MEALS AND AT BEDTIME FOR 30 DAYS  . [DISCONTINUED] cephALEXin (KEFLEX) 500 MG capsule Take 1 capsule (500 mg total) by mouth 3 (three) times daily.    Review of Systems Patient denies any headache, lightheadedness or dizziness.  No sinus or allergy symptoms.    No increased sob or chest pain reported.  No vomiting.  No BRBPR or melana.  No bowel change.   No urine change.  Seeing Dr Eddie Dibbles for his diabetes.  She just adjusted settings.   Increased stress as outlined.  Seeing psychiatry.  On prozac.  Dose just recently increased.         Objective:   Physical Exam  Filed Vitals:   09/21/14 1145  BP: 130/60  Pulse: 54  Temp: 98.1 F (36.7 C)   Blood pressure increased:  67/32  68 year old male in no acute distress.  HEENT:  Nares - clear.  Oropharynx - without lesions. NECK:  Supple.  Nontender.  No audible carotid bruit.  HEART:  Appears to be  regular with premature beats.   LUNGS:  No crackles or wheezing audible.  Respirations even and unlabored.   RADIAL PULSE:  Equal bilaterally.  ABDOMEN:  Soft.  Nontender.  Bowel sounds present and normal.  No audible abdominal bruit.  Healing incision site.  Steri strips still in place.   Left groin: incision - some soft tissue fullness surrounding incision site.   EXTREMITIES:  Swelling has improved.  Follow.       Assessment & Plan:  1. Essential hypertension, benign Blood pressure doing well on current regimen.  Follow.    2. Gastroesophageal reflux disease, esophagitis presence not specified Controlled on protonix.    3. Thyroid disease Followed by Dr Eddie Dibbles.  Tapering tapazole.    4. Type 2 diabetes mellitus with diabetic  peripheral angiopathy without gangrene Has an insulin pump.  Seeing endocrinology.  Discussed importance of eating regular meals.  Follow.    5. Hypercholesterolemia Low cholesterol diet.  Follow lipid panel and liver function.  On pravastatin.   Lab Results  Component Value Date   CHOL 129 03/15/2014   HDL 49.10 03/15/2014   LDLCALC 67 03/15/2014   TRIG 63.0 03/15/2014   CHOLHDL 3 03/15/2014   6. Stress Increased stress.  Planning to follow up with [facility].    7. Lymphoma Being followed at the cancer center.    8. History of colonic polyps Recent colonoscopy.  Saw GI.    HEALTH MAINTENANCE.  Physical 06/20/13.   Schedule f/u physical.  Saw GI previously.  Just had colonoscopy and EGD.      I spent 25 minutes with the patient and more than 50% of the time was spent in consultation regarding the above. (specifically discussion the increased stress, sugars, etc).

## 2014-09-21 NOTE — Progress Notes (Signed)
Pre visit review using our clinic review tool, if applicable. No additional management support is needed unless otherwise documented below in the visit note. 

## 2014-09-22 ENCOUNTER — Encounter: Payer: Self-pay | Admitting: Internal Medicine

## 2014-09-24 LAB — LACTATE DEHYDROGENASE: LDH: 194 U/L (ref 85–241)

## 2014-09-24 LAB — CBC CANCER CENTER
Basophil #: 0.1 x10 3/mm (ref 0.0–0.1)
Basophil %: 1.2 %
Eosinophil #: 0.1 x10 3/mm (ref 0.0–0.7)
Eosinophil %: 2.7 %
HCT: 40.4 % (ref 40.0–52.0)
HGB: 13.5 g/dL (ref 13.0–18.0)
Lymphocyte #: 1.1 x10 3/mm (ref 1.0–3.6)
Lymphocyte %: 23.1 %
MCH: 29.6 pg (ref 26.0–34.0)
MCHC: 33.3 g/dL (ref 32.0–36.0)
MCV: 89 fL (ref 80–100)
MONOS PCT: 7.8 %
Monocyte #: 0.4 x10 3/mm (ref 0.2–1.0)
Neutrophil #: 3.1 x10 3/mm (ref 1.4–6.5)
Neutrophil %: 65.2 %
Platelet: 186 x10 3/mm (ref 150–440)
RBC: 4.55 10*6/uL (ref 4.40–5.90)
RDW: 14.4 % (ref 11.5–14.5)
WBC: 4.8 x10 3/mm (ref 3.8–10.6)

## 2014-10-01 DIAGNOSIS — R5383 Other fatigue: Secondary | ICD-10-CM | POA: Diagnosis not present

## 2014-10-01 DIAGNOSIS — C8315 Mantle cell lymphoma, lymph nodes of inguinal region and lower limb: Secondary | ICD-10-CM | POA: Diagnosis not present

## 2014-10-02 DIAGNOSIS — C8580 Other specified types of non-Hodgkin lymphoma, unspecified site: Secondary | ICD-10-CM | POA: Diagnosis not present

## 2014-10-08 DIAGNOSIS — F4321 Adjustment disorder with depressed mood: Secondary | ICD-10-CM | POA: Diagnosis not present

## 2014-10-09 DIAGNOSIS — I890 Lymphedema, not elsewhere classified: Secondary | ICD-10-CM | POA: Diagnosis not present

## 2014-10-09 DIAGNOSIS — M7989 Other specified soft tissue disorders: Secondary | ICD-10-CM | POA: Diagnosis not present

## 2014-10-12 ENCOUNTER — Ambulatory Visit: Payer: Self-pay | Admitting: Internal Medicine

## 2014-10-12 DIAGNOSIS — R5383 Other fatigue: Secondary | ICD-10-CM | POA: Diagnosis not present

## 2014-10-12 DIAGNOSIS — Z87891 Personal history of nicotine dependence: Secondary | ICD-10-CM | POA: Diagnosis not present

## 2014-10-12 DIAGNOSIS — Z7982 Long term (current) use of aspirin: Secondary | ICD-10-CM | POA: Diagnosis not present

## 2014-10-12 DIAGNOSIS — Z9641 Presence of insulin pump (external) (internal): Secondary | ICD-10-CM | POA: Diagnosis not present

## 2014-10-12 DIAGNOSIS — C8315 Mantle cell lymphoma, lymph nodes of inguinal region and lower limb: Secondary | ICD-10-CM | POA: Diagnosis not present

## 2014-10-12 DIAGNOSIS — Z807 Family history of other malignant neoplasms of lymphoid, hematopoietic and related tissues: Secondary | ICD-10-CM | POA: Diagnosis not present

## 2014-10-12 DIAGNOSIS — R609 Edema, unspecified: Secondary | ICD-10-CM | POA: Diagnosis not present

## 2014-10-12 DIAGNOSIS — Z79899 Other long term (current) drug therapy: Secondary | ICD-10-CM | POA: Diagnosis not present

## 2014-10-12 DIAGNOSIS — I10 Essential (primary) hypertension: Secondary | ICD-10-CM | POA: Diagnosis not present

## 2014-10-12 DIAGNOSIS — K219 Gastro-esophageal reflux disease without esophagitis: Secondary | ICD-10-CM | POA: Diagnosis not present

## 2014-10-12 DIAGNOSIS — Z794 Long term (current) use of insulin: Secondary | ICD-10-CM | POA: Diagnosis not present

## 2014-10-12 DIAGNOSIS — E0590 Thyrotoxicosis, unspecified without thyrotoxic crisis or storm: Secondary | ICD-10-CM | POA: Diagnosis not present

## 2014-10-12 DIAGNOSIS — E119 Type 2 diabetes mellitus without complications: Secondary | ICD-10-CM | POA: Diagnosis not present

## 2014-10-12 DIAGNOSIS — E785 Hyperlipidemia, unspecified: Secondary | ICD-10-CM | POA: Diagnosis not present

## 2014-10-18 ENCOUNTER — Other Ambulatory Visit (INDEPENDENT_AMBULATORY_CARE_PROVIDER_SITE_OTHER): Payer: Medicare Other

## 2014-10-18 DIAGNOSIS — E7800 Pure hypercholesterolemia, unspecified: Secondary | ICD-10-CM

## 2014-10-18 DIAGNOSIS — E1151 Type 2 diabetes mellitus with diabetic peripheral angiopathy without gangrene: Secondary | ICD-10-CM

## 2014-10-18 LAB — HEPATIC FUNCTION PANEL
ALT: 15 U/L (ref 0–53)
AST: 22 U/L (ref 0–37)
Albumin: 3.1 g/dL — ABNORMAL LOW (ref 3.5–5.2)
Alkaline Phosphatase: 99 U/L (ref 39–117)
BILIRUBIN DIRECT: 0.2 mg/dL (ref 0.0–0.3)
BILIRUBIN TOTAL: 0.9 mg/dL (ref 0.2–1.2)
Total Protein: 6.3 g/dL (ref 6.0–8.3)

## 2014-10-18 LAB — LIPID PANEL
Cholesterol: 125 mg/dL (ref 0–200)
HDL: 43.5 mg/dL (ref >39.00)
LDL CALC: 70 mg/dL (ref 0–99)
NonHDL: 81.5
TRIGLYCERIDES: 58 mg/dL (ref 0.0–149.0)
Total CHOL/HDL Ratio: 3
VLDL: 11.6 mg/dL (ref 0.0–40.0)

## 2014-10-18 LAB — HEMOGLOBIN A1C: Hgb A1c MFr Bld: 7.1 % — ABNORMAL HIGH (ref 4.6–6.5)

## 2014-10-19 ENCOUNTER — Other Ambulatory Visit (INDEPENDENT_AMBULATORY_CARE_PROVIDER_SITE_OTHER): Payer: Medicare Other

## 2014-10-19 ENCOUNTER — Encounter: Payer: Self-pay | Admitting: *Deleted

## 2014-10-19 DIAGNOSIS — I10 Essential (primary) hypertension: Secondary | ICD-10-CM | POA: Diagnosis not present

## 2014-10-19 DIAGNOSIS — E2839 Other primary ovarian failure: Secondary | ICD-10-CM

## 2014-10-19 LAB — BASIC METABOLIC PANEL
BUN: 8 mg/dL (ref 6–23)
CHLORIDE: 104 meq/L (ref 96–112)
CO2: 25 meq/L (ref 19–32)
CREATININE: 0.9 mg/dL (ref 0.4–1.5)
Calcium: 8.1 mg/dL — ABNORMAL LOW (ref 8.4–10.5)
GFR: 90.82 mL/min (ref >60.00)
Glucose, Bld: 170 mg/dL — ABNORMAL HIGH (ref 70–99)
POTASSIUM: 3.5 meq/L (ref 3.5–5.1)
Sodium: 139 mEq/L (ref 135–145)

## 2014-10-22 DIAGNOSIS — E0590 Thyrotoxicosis, unspecified without thyrotoxic crisis or storm: Secondary | ICD-10-CM | POA: Diagnosis not present

## 2014-10-22 DIAGNOSIS — R5383 Other fatigue: Secondary | ICD-10-CM | POA: Diagnosis not present

## 2014-10-22 DIAGNOSIS — E119 Type 2 diabetes mellitus without complications: Secondary | ICD-10-CM | POA: Diagnosis not present

## 2014-10-22 DIAGNOSIS — C8315 Mantle cell lymphoma, lymph nodes of inguinal region and lower limb: Secondary | ICD-10-CM | POA: Diagnosis not present

## 2014-10-22 DIAGNOSIS — I10 Essential (primary) hypertension: Secondary | ICD-10-CM | POA: Diagnosis not present

## 2014-10-22 DIAGNOSIS — R609 Edema, unspecified: Secondary | ICD-10-CM | POA: Diagnosis not present

## 2014-10-22 LAB — CBC CANCER CENTER
BASOS PCT: 1.1 %
Basophil #: 0.1 x10 3/mm (ref 0.0–0.1)
EOS PCT: 2.7 %
Eosinophil #: 0.1 x10 3/mm (ref 0.0–0.7)
HCT: 38.4 % — AB (ref 40.0–52.0)
HGB: 13.3 g/dL (ref 13.0–18.0)
LYMPHS ABS: 0.9 x10 3/mm — AB (ref 1.0–3.6)
Lymphocyte %: 19.9 %
MCH: 29.3 pg (ref 26.0–34.0)
MCHC: 34.7 g/dL (ref 32.0–36.0)
MCV: 85 fL (ref 80–100)
MONO ABS: 0.4 x10 3/mm (ref 0.2–1.0)
MONOS PCT: 8.1 %
Neutrophil #: 3.2 x10 3/mm (ref 1.4–6.5)
Neutrophil %: 68.2 %
Platelet: 156 x10 3/mm (ref 150–440)
RBC: 4.55 10*6/uL (ref 4.40–5.90)
RDW: 13.2 % (ref 11.5–14.5)
WBC: 4.6 x10 3/mm (ref 3.8–10.6)

## 2014-10-22 LAB — CREATININE, SERUM
Creatinine: 0.97 mg/dL (ref 0.60–1.30)
EGFR (African American): >60
EGFR (Non-African Amer.): >60

## 2014-10-22 LAB — HEPATIC FUNCTION PANEL A (ARMC)
ALBUMIN: 2.6 g/dL — AB (ref 3.4–5.0)
ALT: 21 U/L
Alkaline Phosphatase: 118 U/L — ABNORMAL HIGH
BILIRUBIN TOTAL: 0.8 mg/dL (ref 0.2–1.0)
Bilirubin, Direct: 0.2 mg/dL (ref 0.0–0.2)
SGOT(AST): 19 U/L (ref 15–37)
Total Protein: 6.2 g/dL — ABNORMAL LOW (ref 6.4–8.2)

## 2014-10-22 LAB — URIC ACID: Uric Acid: 3.1 mg/dL — ABNORMAL LOW (ref 3.5–7.2)

## 2014-10-22 LAB — LACTATE DEHYDROGENASE: LDH: 154 U/L (ref 85–241)

## 2014-11-02 ENCOUNTER — Encounter: Payer: Self-pay | Admitting: *Deleted

## 2014-11-02 DIAGNOSIS — I2510 Atherosclerotic heart disease of native coronary artery without angina pectoris: Secondary | ICD-10-CM | POA: Insufficient documentation

## 2014-11-07 ENCOUNTER — Encounter: Payer: Self-pay | Admitting: Internal Medicine

## 2014-11-12 ENCOUNTER — Ambulatory Visit: Payer: Self-pay | Admitting: Internal Medicine

## 2014-11-12 DIAGNOSIS — E0590 Thyrotoxicosis, unspecified without thyrotoxic crisis or storm: Secondary | ICD-10-CM | POA: Diagnosis not present

## 2014-11-12 DIAGNOSIS — Z794 Long term (current) use of insulin: Secondary | ICD-10-CM | POA: Diagnosis not present

## 2014-11-12 DIAGNOSIS — Z807 Family history of other malignant neoplasms of lymphoid, hematopoietic and related tissues: Secondary | ICD-10-CM | POA: Diagnosis not present

## 2014-11-12 DIAGNOSIS — Z9641 Presence of insulin pump (external) (internal): Secondary | ICD-10-CM | POA: Diagnosis not present

## 2014-11-12 DIAGNOSIS — R634 Abnormal weight loss: Secondary | ICD-10-CM | POA: Diagnosis not present

## 2014-11-12 DIAGNOSIS — I10 Essential (primary) hypertension: Secondary | ICD-10-CM | POA: Diagnosis not present

## 2014-11-12 DIAGNOSIS — E119 Type 2 diabetes mellitus without complications: Secondary | ICD-10-CM | POA: Diagnosis not present

## 2014-11-12 DIAGNOSIS — R609 Edema, unspecified: Secondary | ICD-10-CM | POA: Diagnosis not present

## 2014-11-12 DIAGNOSIS — Z87891 Personal history of nicotine dependence: Secondary | ICD-10-CM | POA: Diagnosis not present

## 2014-11-12 DIAGNOSIS — R5383 Other fatigue: Secondary | ICD-10-CM | POA: Diagnosis not present

## 2014-11-12 DIAGNOSIS — Z79899 Other long term (current) drug therapy: Secondary | ICD-10-CM | POA: Diagnosis not present

## 2014-11-12 DIAGNOSIS — Z7982 Long term (current) use of aspirin: Secondary | ICD-10-CM | POA: Diagnosis not present

## 2014-11-12 DIAGNOSIS — E785 Hyperlipidemia, unspecified: Secondary | ICD-10-CM | POA: Diagnosis not present

## 2014-11-12 DIAGNOSIS — C8315 Mantle cell lymphoma, lymph nodes of inguinal region and lower limb: Secondary | ICD-10-CM | POA: Diagnosis not present

## 2014-11-12 DIAGNOSIS — K219 Gastro-esophageal reflux disease without esophagitis: Secondary | ICD-10-CM | POA: Diagnosis not present

## 2014-11-21 DIAGNOSIS — E119 Type 2 diabetes mellitus without complications: Secondary | ICD-10-CM | POA: Diagnosis not present

## 2014-11-21 DIAGNOSIS — I10 Essential (primary) hypertension: Secondary | ICD-10-CM | POA: Diagnosis not present

## 2014-11-21 DIAGNOSIS — R5383 Other fatigue: Secondary | ICD-10-CM | POA: Diagnosis not present

## 2014-11-21 DIAGNOSIS — R634 Abnormal weight loss: Secondary | ICD-10-CM | POA: Diagnosis not present

## 2014-11-21 DIAGNOSIS — C8315 Mantle cell lymphoma, lymph nodes of inguinal region and lower limb: Secondary | ICD-10-CM | POA: Diagnosis not present

## 2014-11-21 DIAGNOSIS — R609 Edema, unspecified: Secondary | ICD-10-CM | POA: Diagnosis not present

## 2014-11-27 DIAGNOSIS — E0500 Thyrotoxicosis with diffuse goiter without thyrotoxic crisis or storm: Secondary | ICD-10-CM | POA: Diagnosis not present

## 2014-11-27 DIAGNOSIS — E10649 Type 1 diabetes mellitus with hypoglycemia without coma: Secondary | ICD-10-CM | POA: Diagnosis not present

## 2014-11-30 DIAGNOSIS — E119 Type 2 diabetes mellitus without complications: Secondary | ICD-10-CM | POA: Diagnosis not present

## 2014-11-30 DIAGNOSIS — I10 Essential (primary) hypertension: Secondary | ICD-10-CM | POA: Diagnosis not present

## 2014-11-30 DIAGNOSIS — R609 Edema, unspecified: Secondary | ICD-10-CM | POA: Diagnosis not present

## 2014-11-30 DIAGNOSIS — R5383 Other fatigue: Secondary | ICD-10-CM | POA: Diagnosis not present

## 2014-11-30 DIAGNOSIS — C8315 Mantle cell lymphoma, lymph nodes of inguinal region and lower limb: Secondary | ICD-10-CM | POA: Diagnosis not present

## 2014-11-30 DIAGNOSIS — R634 Abnormal weight loss: Secondary | ICD-10-CM | POA: Diagnosis not present

## 2014-12-04 DIAGNOSIS — E10649 Type 1 diabetes mellitus with hypoglycemia without coma: Secondary | ICD-10-CM | POA: Diagnosis not present

## 2014-12-04 DIAGNOSIS — E0500 Thyrotoxicosis with diffuse goiter without thyrotoxic crisis or storm: Secondary | ICD-10-CM | POA: Diagnosis not present

## 2014-12-04 DIAGNOSIS — E785 Hyperlipidemia, unspecified: Secondary | ICD-10-CM | POA: Diagnosis not present

## 2014-12-04 DIAGNOSIS — F4321 Adjustment disorder with depressed mood: Secondary | ICD-10-CM | POA: Diagnosis not present

## 2014-12-04 DIAGNOSIS — Z4681 Encounter for fitting and adjustment of insulin pump: Secondary | ICD-10-CM | POA: Diagnosis not present

## 2014-12-11 ENCOUNTER — Ambulatory Visit
Admit: 2014-12-11 | Disposition: A | Discharge status: Home / Self Care | Payer: Self-pay | Attending: Internal Medicine | Admitting: Internal Medicine

## 2014-12-11 DIAGNOSIS — J948 Other specified pleural conditions: Secondary | ICD-10-CM | POA: Diagnosis not present

## 2014-12-11 DIAGNOSIS — Z9641 Presence of insulin pump (external) (internal): Secondary | ICD-10-CM | POA: Diagnosis not present

## 2014-12-11 DIAGNOSIS — M160 Bilateral primary osteoarthritis of hip: Secondary | ICD-10-CM | POA: Diagnosis not present

## 2014-12-11 DIAGNOSIS — Z9049 Acquired absence of other specified parts of digestive tract: Secondary | ICD-10-CM | POA: Diagnosis not present

## 2014-12-11 DIAGNOSIS — J438 Other emphysema: Secondary | ICD-10-CM | POA: Diagnosis not present

## 2014-12-11 DIAGNOSIS — R609 Edema, unspecified: Secondary | ICD-10-CM | POA: Diagnosis not present

## 2014-12-11 DIAGNOSIS — E119 Type 2 diabetes mellitus without complications: Secondary | ICD-10-CM | POA: Diagnosis not present

## 2014-12-11 DIAGNOSIS — Z87891 Personal history of nicotine dependence: Secondary | ICD-10-CM | POA: Diagnosis not present

## 2014-12-11 DIAGNOSIS — I2510 Atherosclerotic heart disease of native coronary artery without angina pectoris: Secondary | ICD-10-CM | POA: Diagnosis not present

## 2014-12-11 DIAGNOSIS — E785 Hyperlipidemia, unspecified: Secondary | ICD-10-CM | POA: Diagnosis not present

## 2014-12-11 DIAGNOSIS — M5134 Other intervertebral disc degeneration, thoracic region: Secondary | ICD-10-CM | POA: Diagnosis not present

## 2014-12-11 DIAGNOSIS — E0590 Thyrotoxicosis, unspecified without thyrotoxic crisis or storm: Secondary | ICD-10-CM | POA: Diagnosis not present

## 2014-12-11 DIAGNOSIS — R5383 Other fatigue: Secondary | ICD-10-CM | POA: Diagnosis not present

## 2014-12-11 DIAGNOSIS — Z807 Family history of other malignant neoplasms of lymphoid, hematopoietic and related tissues: Secondary | ICD-10-CM | POA: Diagnosis not present

## 2014-12-11 DIAGNOSIS — C8315 Mantle cell lymphoma, lymph nodes of inguinal region and lower limb: Secondary | ICD-10-CM | POA: Diagnosis not present

## 2014-12-11 DIAGNOSIS — K219 Gastro-esophageal reflux disease without esophagitis: Secondary | ICD-10-CM | POA: Diagnosis not present

## 2014-12-11 DIAGNOSIS — Z7982 Long term (current) use of aspirin: Secondary | ICD-10-CM | POA: Diagnosis not present

## 2014-12-11 DIAGNOSIS — R634 Abnormal weight loss: Secondary | ICD-10-CM | POA: Diagnosis not present

## 2014-12-11 DIAGNOSIS — I10 Essential (primary) hypertension: Secondary | ICD-10-CM | POA: Diagnosis not present

## 2014-12-11 DIAGNOSIS — Z79899 Other long term (current) drug therapy: Secondary | ICD-10-CM | POA: Diagnosis not present

## 2014-12-19 DIAGNOSIS — J948 Other specified pleural conditions: Secondary | ICD-10-CM | POA: Diagnosis not present

## 2014-12-19 DIAGNOSIS — R5383 Other fatigue: Secondary | ICD-10-CM | POA: Diagnosis not present

## 2014-12-19 DIAGNOSIS — R609 Edema, unspecified: Secondary | ICD-10-CM | POA: Diagnosis not present

## 2014-12-19 DIAGNOSIS — C8315 Mantle cell lymphoma, lymph nodes of inguinal region and lower limb: Secondary | ICD-10-CM | POA: Diagnosis not present

## 2014-12-19 DIAGNOSIS — R634 Abnormal weight loss: Secondary | ICD-10-CM | POA: Diagnosis not present

## 2014-12-19 DIAGNOSIS — I10 Essential (primary) hypertension: Secondary | ICD-10-CM | POA: Diagnosis not present

## 2014-12-25 ENCOUNTER — Encounter: Payer: PRIVATE HEALTH INSURANCE | Admitting: Internal Medicine

## 2014-12-25 DIAGNOSIS — I2510 Atherosclerotic heart disease of native coronary artery without angina pectoris: Secondary | ICD-10-CM | POA: Diagnosis not present

## 2014-12-25 DIAGNOSIS — K219 Gastro-esophageal reflux disease without esophagitis: Secondary | ICD-10-CM | POA: Diagnosis not present

## 2014-12-25 DIAGNOSIS — I10 Essential (primary) hypertension: Secondary | ICD-10-CM | POA: Diagnosis not present

## 2014-12-25 DIAGNOSIS — I493 Ventricular premature depolarization: Secondary | ICD-10-CM | POA: Diagnosis not present

## 2014-12-26 ENCOUNTER — Ambulatory Visit (INDEPENDENT_AMBULATORY_CARE_PROVIDER_SITE_OTHER): Payer: Medicare Other | Admitting: Internal Medicine

## 2014-12-26 ENCOUNTER — Encounter: Payer: Self-pay | Admitting: Internal Medicine

## 2014-12-26 VITALS — BP 126/70 | HR 69 | Temp 97.8°F | Ht 70.0 in | Wt 212.5 lb

## 2014-12-26 DIAGNOSIS — C8590 Non-Hodgkin lymphoma, unspecified, unspecified site: Secondary | ICD-10-CM

## 2014-12-26 DIAGNOSIS — Z86010 Personal history of colonic polyps: Secondary | ICD-10-CM

## 2014-12-26 DIAGNOSIS — Z23 Encounter for immunization: Secondary | ICD-10-CM | POA: Diagnosis not present

## 2014-12-26 DIAGNOSIS — E1151 Type 2 diabetes mellitus with diabetic peripheral angiopathy without gangrene: Secondary | ICD-10-CM

## 2014-12-26 DIAGNOSIS — K3184 Gastroparesis: Secondary | ICD-10-CM

## 2014-12-26 DIAGNOSIS — I2510 Atherosclerotic heart disease of native coronary artery without angina pectoris: Secondary | ICD-10-CM

## 2014-12-26 DIAGNOSIS — I10 Essential (primary) hypertension: Secondary | ICD-10-CM | POA: Diagnosis not present

## 2014-12-26 DIAGNOSIS — Z658 Other specified problems related to psychosocial circumstances: Secondary | ICD-10-CM

## 2014-12-26 DIAGNOSIS — F439 Reaction to severe stress, unspecified: Secondary | ICD-10-CM

## 2014-12-26 DIAGNOSIS — K219 Gastro-esophageal reflux disease without esophagitis: Secondary | ICD-10-CM | POA: Diagnosis not present

## 2014-12-26 DIAGNOSIS — E7800 Pure hypercholesterolemia, unspecified: Secondary | ICD-10-CM

## 2014-12-26 DIAGNOSIS — E079 Disorder of thyroid, unspecified: Secondary | ICD-10-CM

## 2014-12-26 NOTE — Progress Notes (Signed)
Pre visit review using our clinic review tool, if applicable. No additional management support is needed unless otherwise documented below in the visit note. 

## 2014-12-30 ENCOUNTER — Encounter: Payer: Self-pay | Admitting: Internal Medicine

## 2014-12-30 NOTE — Assessment & Plan Note (Signed)
EGD as outlined.  Continue on protonix.

## 2014-12-30 NOTE — Assessment & Plan Note (Signed)
Blood pressure doing well.  Same medications.  Follow pressures.  Follow metabolic panel.

## 2014-12-30 NOTE — Assessment & Plan Note (Signed)
Continue current medication regimen.  Seeing Dr Eddie Dibbles.  A1c 6.4.  Sees Dr Eddie Dibbles.

## 2014-12-30 NOTE — Assessment & Plan Note (Signed)
Sees Dr Nehemiah Massed.  Just evaluated yesterday.  Currently doing well.  Continue risk factor modification.

## 2014-12-30 NOTE — Progress Notes (Signed)
[name], male   DOB: 01-05-1946, 69 y.o.   MRN: [id number]   Subjective:    [name], male    DOB: 1946/07/03, 69 y.o.   MRN: [id number]  HPI  Patient here for his physical exam.  Seeing oncology for mantle cell lymphoma.  See note.  Just saw Dr Eddie Dibbles last week.  A1c 6.4.  Trying to watch his diet.  Saw Dr Nehemiah Massed.  Heart stable.  No chest pain or tightness.  Breathing stable.  No nausea or vomiting.  Son is in prison now.  Home situation is some better.  Seeing Dr Nicolasa Ducking.     Past Medical History  Diagnosis Date  . Peripheral neuropathy   . Hypertension   . 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 Hypercholesterolemia   . Gastroparesis   . Graves disease   . Depression     Current Outpatient Prescriptions on File Prior to Visit  Medication Sig Dispense Refill  . aspirin EC 325 MG tablet Take by mouth.    Marland Kitchen FLUoxetine (PROZAC) 20 MG capsule Take 20 mg by mouth daily.  0  . fluticasone (FLOVENT HFA) 110 MCG/ACT inhaler Inhale 2 puffs into the lungs 2 (two) times daily. 3 Inhaler 1  . insulin aspart (NOVOLOG) 100 UNIT/ML injection Use as directed 1 vial 12  . lisinopril (PRINIVIL,ZESTRIL) 20 MG tablet TAKE 1 TABLET (20 MG TOTAL) BY MOUTH AT BEDTIME. 90 tablet 1  . methimazole (TAPAZOLE) 10 MG tablet Take 20 mg by mouth 2 (two) times daily.    . metoCLOPramide (REGLAN) 10 MG tablet TAKE 1 TABLET (10 MG TOTAL) BY MOUTH AT BEDTIME. 90 tablet 1  . pantoprazole (PROTONIX) 40 MG tablet TAKE 1 TABLET (40 MG TOTAL) BY MOUTH DAILY. 90 tablet 1  . pravastatin (PRAVACHOL) 40 MG tablet TAKE 1 TABLET (40 MG TOTAL) BY MOUTH AT BEDTIME. 90 tablet 1  . sucralfate (CARAFATE) 1 G tablet TAKE 1 TABLET BY MOUTH 4 TIMES A DAY BEFORE MEALS AND AT BEDTIME FOR 30 DAYS 120 tablet 0   No current facility-administered medications on file prior to visit.    Review of Systems  Constitutional: Negative for appetite change and  unexpected weight change.  HENT: Negative for congestion and sinus pressure.   Eyes: Negative for pain and visual disturbance.  Respiratory: Negative for cough, chest tightness and shortness of breath.   Cardiovascular: Negative for chest pain and palpitations.  Gastrointestinal: Negative for nausea, vomiting, abdominal pain and diarrhea.  Genitourinary: Negative for dysuria and difficulty urinating.  Musculoskeletal: Negative for back pain and joint swelling.  Skin: Negative for color change and rash.  Neurological: Negative for dizziness, light-headedness and headaches.  Hematological: Negative for adenopathy. Does not bruise/bleed easily.  Psychiatric/Behavioral: Negative for dysphoric mood and agitation.       Objective:     Blood pressure recheck:  138/68  Physical Exam  Constitutional: He is oriented to person, place, and time. He appears well-developed and well-nourished. No distress.  HENT:  Head: Normocephalic and atraumatic.  Nose: Nose normal.  Mouth/Throat: Oropharynx is clear and moist. No oropharyngeal exudate.  Eyes: Conjunctivae are normal. Right eye exhibits no discharge. Left eye exhibits no discharge.  Neck: Neck supple. No thyromegaly present.  Cardiovascular: Normal rate and regular rhythm.   Pulmonary/Chest: Breath sounds normal. No respiratory distress. He has no wheezes.  Abdominal: Soft. Bowel sounds are normal. There is no tenderness.  Genitourinary:  Rectal exam performed and revealed no palpable prostate nodule.  Heme negative.    Musculoskeletal: He exhibits no edema or tenderness.  Lymphadenopathy:    He has no cervical adenopathy.  Neurological: He is alert and oriented to person, place, and time.  Skin: Skin is warm and dry. No rash noted.  Psychiatric: He has a normal mood and affect. His behavior is normal.    BP 126/70 mmHg  Pulse 69  Temp(Src) 97.8 F (36.6 C) (Oral)  Ht 5\' 10"  (1.778 m)  Wt 212 lb 8 oz (96.389 kg)  BMI 30.49 kg/m2   SpO2 98% Wt Readings from Last 3 Encounters:  12/26/14 212 lb 8 oz (96.389 kg)  09/21/14 213 lb 12 oz (96.956 kg)  08/03/14 220 lb 8 oz (100.018 kg)     Lab Results  Component Value Date   WBC 6.3 10/13/2013   HGB 15.4 10/13/2013   HCT 44.5 10/13/2013   PLT 197.0 10/13/2013   GLUCOSE 170* 10/19/2014   CHOL 125 10/18/2014   TRIG 58.0 10/18/2014   HDL 43.50 10/18/2014   LDLCALC 70 10/18/2014   ALT 15 10/18/2014   AST 22 10/18/2014   NA 139 10/19/2014   K 3.5 10/19/2014   CL 104 10/19/2014   CREATININE 0.9 10/19/2014   BUN 8 10/19/2014   CO2 25 10/19/2014   HGBA1C 7.1* 10/18/2014   MICROALBUR 0.6 10/13/2013    Dg Chest 2 View  04/17/2014   CLINICAL DATA:  Cough and congestion  EXAM: CHEST  2 VIEW  COMPARISON:  None.  FINDINGS: The heart size and mediastinal contours are within normal limits. Both lungs are clear. The visualized skeletal structures are unremarkable.  IMPRESSION: No active cardiopulmonary disease.   Electronically Signed   By: Franchot Gallo M.D.   On: 04/17/2014 14:17       Assessment & Plan:   Problem List Items Addressed This Visit    CAD in native artery    Sees Dr Nehemiah Massed.  Just evaluated yesterday.  Currently doing well.  Continue risk factor modification.        Diabetes    Continue current medication regimen.  Seeing Dr Eddie Dibbles.  A1c 6.4.  Sees Dr Eddie Dibbles.        Relevant Orders   Hemoglobin A1c   Microalbumin / creatinine urine ratio   Essential hypertension, benign    Blood pressure doing well.  Same medications.  Follow pressures.  Follow metabolic panel.        Relevant Orders   Basic metabolic panel   Gastroparesis    On carafate and reglan.  Seeing GI.  Currently doing well.        GERD (gastroesophageal reflux disease)    EGD as outlined.  Continue on protonix.        History of colonic polyps    Colonoscopy 01/08/14 - tubular adenoma.   Recommended f/u colonoscopy in 12/2018.        Hypercholesterolemia    Low cholesterol diet  and exercise.  On pravastatin.  Follow lipid panel and liver function tests.  LDL 10/18/14 - 70.       Relevant Orders   Lipid panel   Hepatic function panel   Lymphoma    Diagnosed with mantle cell lymphoma.  Seeing oncology.  Refer to their note for details.        Stress    Seeing Dr Nicolasa Ducking.  Doing well on current regimen.  Follow.  Home situation better now.  Follow.       Thyroid disease    Followed by Dr Eddie Dibbles.  On tapazole.  Just evaluated last week.         Other Visit Diagnoses    Need for prophylactic vaccination against Streptococcus pneumoniae (pneumococcus)    -  Primary    Relevant Orders    Pneumococcal conjugate vaccine 13-valent (Completed)      I spent 25 minutes with the patient and more than 50% of the time was spent in consultation regarding the above.     [name], MD

## 2014-12-30 NOTE — Assessment & Plan Note (Signed)
Followed by Dr Eddie Dibbles.  On tapazole.  Just evaluated last week.

## 2014-12-30 NOTE — Assessment & Plan Note (Signed)
Diagnosed with mantle cell lymphoma.  Seeing oncology.  Refer to their note for details.

## 2014-12-30 NOTE — Assessment & Plan Note (Signed)
Seeing Dr Nicolasa Ducking.  Doing well on current regimen.  Follow.  Home situation better now.  Follow.

## 2014-12-30 NOTE — Assessment & Plan Note (Signed)
On carafate and reglan.  Seeing GI.  Currently doing well.

## 2014-12-30 NOTE — Assessment & Plan Note (Signed)
Colonoscopy 01/08/14 - tubular adenoma.   Recommended f/u colonoscopy in 12/2018.

## 2014-12-30 NOTE — Assessment & Plan Note (Signed)
Low cholesterol diet and exercise.  On pravastatin.  Follow lipid panel and liver function tests.  LDL 10/18/14 - 70.

## 2015-01-02 DIAGNOSIS — J948 Other specified pleural conditions: Secondary | ICD-10-CM | POA: Diagnosis not present

## 2015-01-02 DIAGNOSIS — R5383 Other fatigue: Secondary | ICD-10-CM | POA: Diagnosis not present

## 2015-01-02 DIAGNOSIS — R609 Edema, unspecified: Secondary | ICD-10-CM | POA: Diagnosis not present

## 2015-01-02 DIAGNOSIS — I10 Essential (primary) hypertension: Secondary | ICD-10-CM | POA: Diagnosis not present

## 2015-01-02 DIAGNOSIS — C8310 Mantle cell lymphoma, unspecified site: Secondary | ICD-10-CM | POA: Diagnosis not present

## 2015-01-02 DIAGNOSIS — C8315 Mantle cell lymphoma, lymph nodes of inguinal region and lower limb: Secondary | ICD-10-CM | POA: Diagnosis not present

## 2015-01-02 DIAGNOSIS — R634 Abnormal weight loss: Secondary | ICD-10-CM | POA: Diagnosis not present

## 2015-01-09 ENCOUNTER — Other Ambulatory Visit: Payer: Self-pay | Admitting: Internal Medicine

## 2015-01-11 ENCOUNTER — Ambulatory Visit
Admit: 2015-01-11 | Disposition: A | Discharge status: Home / Self Care | Payer: Self-pay | Attending: Internal Medicine | Admitting: Internal Medicine

## 2015-01-11 DIAGNOSIS — E039 Hypothyroidism, unspecified: Secondary | ICD-10-CM | POA: Diagnosis not present

## 2015-01-11 DIAGNOSIS — R634 Abnormal weight loss: Secondary | ICD-10-CM | POA: Diagnosis not present

## 2015-01-11 DIAGNOSIS — R5383 Other fatigue: Secondary | ICD-10-CM | POA: Diagnosis not present

## 2015-01-11 DIAGNOSIS — I2510 Atherosclerotic heart disease of native coronary artery without angina pectoris: Secondary | ICD-10-CM | POA: Diagnosis not present

## 2015-01-11 DIAGNOSIS — I10 Essential (primary) hypertension: Secondary | ICD-10-CM | POA: Diagnosis not present

## 2015-01-11 DIAGNOSIS — Z87891 Personal history of nicotine dependence: Secondary | ICD-10-CM | POA: Diagnosis not present

## 2015-01-11 DIAGNOSIS — E785 Hyperlipidemia, unspecified: Secondary | ICD-10-CM | POA: Diagnosis not present

## 2015-01-11 DIAGNOSIS — Z9641 Presence of insulin pump (external) (internal): Secondary | ICD-10-CM | POA: Diagnosis not present

## 2015-01-11 DIAGNOSIS — C8315 Mantle cell lymphoma, lymph nodes of inguinal region and lower limb: Secondary | ICD-10-CM | POA: Diagnosis not present

## 2015-01-11 DIAGNOSIS — E0590 Thyrotoxicosis, unspecified without thyrotoxic crisis or storm: Secondary | ICD-10-CM | POA: Diagnosis not present

## 2015-01-11 DIAGNOSIS — I517 Cardiomegaly: Secondary | ICD-10-CM | POA: Diagnosis not present

## 2015-01-11 DIAGNOSIS — E119 Type 2 diabetes mellitus without complications: Secondary | ICD-10-CM | POA: Diagnosis not present

## 2015-01-11 DIAGNOSIS — R609 Edema, unspecified: Secondary | ICD-10-CM | POA: Diagnosis not present

## 2015-01-11 DIAGNOSIS — Z7982 Long term (current) use of aspirin: Secondary | ICD-10-CM | POA: Diagnosis not present

## 2015-01-11 DIAGNOSIS — Z794 Long term (current) use of insulin: Secondary | ICD-10-CM | POA: Diagnosis not present

## 2015-01-11 DIAGNOSIS — Z807 Family history of other malignant neoplasms of lymphoid, hematopoietic and related tissues: Secondary | ICD-10-CM | POA: Diagnosis not present

## 2015-01-11 DIAGNOSIS — Z79899 Other long term (current) drug therapy: Secondary | ICD-10-CM | POA: Diagnosis not present

## 2015-01-11 DIAGNOSIS — K219 Gastro-esophageal reflux disease without esophagitis: Secondary | ICD-10-CM | POA: Diagnosis not present

## 2015-01-16 DIAGNOSIS — R609 Edema, unspecified: Secondary | ICD-10-CM | POA: Diagnosis not present

## 2015-01-16 DIAGNOSIS — R5383 Other fatigue: Secondary | ICD-10-CM | POA: Diagnosis not present

## 2015-01-16 DIAGNOSIS — R634 Abnormal weight loss: Secondary | ICD-10-CM | POA: Diagnosis not present

## 2015-01-16 DIAGNOSIS — E119 Type 2 diabetes mellitus without complications: Secondary | ICD-10-CM | POA: Diagnosis not present

## 2015-01-16 DIAGNOSIS — I10 Essential (primary) hypertension: Secondary | ICD-10-CM | POA: Diagnosis not present

## 2015-01-16 DIAGNOSIS — C8315 Mantle cell lymphoma, lymph nodes of inguinal region and lower limb: Secondary | ICD-10-CM | POA: Diagnosis not present

## 2015-01-16 LAB — CBC CANCER CENTER
BASOS PCT: 0.6 %
Basophil #: 0 x10 3/mm (ref 0.0–0.1)
EOS ABS: 0.2 x10 3/mm (ref 0.0–0.7)
EOS PCT: 3.6 %
HCT: 38.9 % — ABNORMAL LOW (ref 40.0–52.0)
HGB: 13.2 g/dL (ref 13.0–18.0)
LYMPHS ABS: 1.4 x10 3/mm (ref 1.0–3.6)
Lymphocyte %: 20.3 %
MCH: 28 pg (ref 26.0–34.0)
MCHC: 33.8 g/dL (ref 32.0–36.0)
MCV: 83 fL (ref 80–100)
MONO ABS: 0.4 x10 3/mm (ref 0.2–1.0)
Monocyte %: 6.4 %
NEUTROS ABS: 4.7 x10 3/mm (ref 1.4–6.5)
Neutrophil %: 69.1 %
PLATELETS: 156 x10 3/mm (ref 150–440)
RBC: 4.7 10*6/uL (ref 4.40–5.90)
RDW: 14.6 % — AB (ref 11.5–14.5)
WBC: 6.8 x10 3/mm (ref 3.8–10.6)

## 2015-01-16 LAB — POTASSIUM: POTASSIUM: 3.5 mmol/L

## 2015-01-16 LAB — LACTATE DEHYDROGENASE: LDH: 157 U/L

## 2015-01-16 LAB — CREATININE, SERUM
Creatinine: 0.89 mg/dL
EGFR (African American): >60
EGFR (Non-African Amer.): >60

## 2015-01-16 LAB — URIC ACID: URIC ACID: 3.9 mg/dL — AB

## 2015-01-21 DIAGNOSIS — E10649 Type 1 diabetes mellitus with hypoglycemia without coma: Secondary | ICD-10-CM | POA: Diagnosis not present

## 2015-01-21 DIAGNOSIS — I2510 Atherosclerotic heart disease of native coronary artery without angina pectoris: Secondary | ICD-10-CM | POA: Diagnosis not present

## 2015-01-21 DIAGNOSIS — K219 Gastro-esophageal reflux disease without esophagitis: Secondary | ICD-10-CM | POA: Diagnosis not present

## 2015-01-21 DIAGNOSIS — I10 Essential (primary) hypertension: Secondary | ICD-10-CM | POA: Diagnosis not present

## 2015-01-25 ENCOUNTER — Other Ambulatory Visit: Payer: Self-pay | Admitting: Internal Medicine

## 2015-01-25 DIAGNOSIS — C8298 Follicular lymphoma, unspecified, lymph nodes of multiple sites: Secondary | ICD-10-CM

## 2015-02-01 NOTE — Op Note (Signed)
PATIENT NAME:  Gary [name], Gary [name] MR#:  [id number] DATE OF BIRTH:  1946-02-27  DATE OF PROCEDURE:  09/18/2013  PREOPERATIVE DIAGNOSIS: Cataract, right eye.   POSTOPERATIVE DIAGNOSIS: Cataract, right eye.  PROCEDURE PERFORMED: Extracapsular cataract extraction using phacoemulsification with placement of Alcon [id number], 21.0-diopter posterior chamber lens, serial number [id number].   SURGEON: Loura Back. Venna Berberich, M.D.   ANESTHESIA: Lidocaine 4% and 0.75% Marcaine a 50-50 mixture with 10 units/mL of HyoMax given as peribulbar.   ANESTHESIOLOGIST: Dr. Ronelle Nigh.   COMPLICATIONS: None.   ESTIMATED BLOOD LOSS: Less than 1 mL.   DESCRIPTION OF PROCEDURE: The patient was brought to the operating room and given a peribulbar block.  The patient was then prepped and draped in the usual fashion.  The vertical rectus muscles were imbricated using 5-0 silk sutures.  These sutures were then clamped to the sterile drapes as bridle sutures.  A limbal peritomy was performed extending two clock hours and hemostasis was obtained with cautery.  A partial thickness scleral groove was made at the surgical limbus and dissected anteriorly in a lamellar dissection using an Alcon crescent knife.  The anterior chamber was entered superonasally with a Superblade and through the lamellar dissection with a 2.6 mm keratome.  DisCoVisc was used to replace the aqueous and a continuous tear capsulorrhexis was carried out.  Hydrodissection and hydrodelineation were carried out with balanced salt and a 27 gauge canula.  The nucleus was rotated to confirm the effectiveness of the hydrodissection.  Phacoemulsification was carried out using a divide-and-conquer technique.  Total ultrasound time was 1 minute and 53 seconds with an average power of  22.3%. CDE 39.26.  Irrigation/aspiration was used to remove the residual cortex.  DisCoVisc was used to inflate the capsule and the internal incision was enlarged to 3 mm with the crescent  knife.  The intraocular lens was folded and inserted into the capsular bag using the AcrySert delivery system.  Irrigation/aspiration was used to remove the residual DisCoVisc.  Miostat was injected into the anterior chamber through the paracentesis track to inflate the anterior chamber and induce miosis.  A tenth of a mL of cefuroxime containing 1 mg of drug was injected via the paracentesis track. The wound was checked for leaks and none were found. The conjunctiva was closed with cautery and the bridle sutures were removed.  Two drops of 0.3% Vigamox were placed on the eye.   An eye shield was placed on the eye.  The patient was discharged to the recovery room in good condition.  ____________________________ Loura Back Gary Fasching, MD sad:aw D: 09/18/2013 13:32:47 ET T: 09/18/2013 13:57:17 ET JOB#: [id number]  cc: Remo Lipps A. Andry Bogden, MD, <Dictator> Martie Lee MD ELECTRONICALLY SIGNED 09/25/2013 12:49

## 2015-02-01 NOTE — Op Note (Signed)
PATIENT NAME:  Gary [name], Gary [name] MR#:  [id number] DATE OF BIRTH:  1946/02/15  DATE OF PROCEDURE:  08/07/2013  PREOPERATIVE DIAGNOSIS: Cataract, left eye.   POSTOPERATIVE DIAGNOSIS: Cataract, left eye.   PROCEDURE PERFORMED: Extracapsular cataract extraction using phacoemulsification with placement of Alcon [id number], 22.0 diopter, posterior chamber lens, serial number [id number].  SURGEON: Loura Back. Shimon Trowbridge, MD  ANESTHESIA: 4% lidocaine, 0.75% Marcaine in a 50-50 mixture with 10 units per mL of Hyalex added, given as peribulbar.   ANESTHESIOLOGIST: Dr. [place] Service  COMPLICATIONS: None.   ESTIMATED BLOOD LOSS: Less than 1 mL.   DESCRIPTION OF PROCEDURE:  The patient was brought to the operating room and given a peribulbar block.  The patient was then prepped and draped in the usual fashion.  The vertical rectus muscles were imbricated using 5-0 silk sutures.  These sutures were then clamped to the sterile drapes as bridle sutures.  A limbal peritomy was performed extending two clock hours and hemostasis was obtained with cautery.  A partial thickness scleral groove was made at the surgical limbus and dissected anteriorly in a lamellar dissection using an Alcon crescent knife.  The anterior chamber was entered supero-temporally with a Superblade and through the lamellar dissection with a 2.6 mm keratome.  DisCoVisc was used to replace the aqueous and a continuous tear capsulorrhexis was carried out.  Hydrodissection and hydrodelineation were carried out with balanced salt and a 27 gauge canula.  The nucleus was rotated to confirm the effectiveness of the hydrodissection.  Phacoemulsification was carried out using a divide-and-conquer technique.  Total ultrasound time was 1 minute and 20 seconds with an average power of 23.6 percent.  Irrigation/aspiration was used to remove the residual cortex.  DisCoVisc was used to inflate the capsule and the internal incision was enlarged to 3 mm with the  crescent knife.  The intraocular lens was folded and inserted into the capsular bag using the AcrySert delivery system. Irrigation/aspiration was used to remove the residual DisCoVisc.  Cefuroxime 1/10 mL was injected into the anterior chamber through the paracentesis track to inflate the anterior chamber and induce miosis.  The wound was checked for leaks and none were found. The conjunctiva was closed with cautery and the bridle sutures were removed.  Two drops of 0.3% Vigamox were placed on the eye.   An eye shield was placed on the eye.  The patient was discharged to the recovery room in good condition.    ____________________________ Loura Back Tamarah Bhullar, MD sad:mr D: 08/07/2013 13:26:43 ET T: 08/07/2013 20:42:40 ET JOB#: [id number]  cc: Remo Lipps A. Makenley Shimp, MD, <Dictator> Martie Lee MD ELECTRONICALLY SIGNED 08/14/2013 8:24

## 2015-02-02 NOTE — Op Note (Signed)
PATIENT NAME:  Gary [name], Gary [name] MR#:  [id number] DATE OF BIRTH:  09/07/1946  DATE OF PROCEDURE:  07/12/2014  PREOPERATIVE DIAGNOSIS: Chronic calculus cholecystitis and diffuse lymphadenopathy concerning for lymphoma.   POSTOPERATIVE DIAGNOSIS: Chronic calculus cholecystitis and diffuse lymphadenopathy concerning for lymphoma.   PROCEDURES PERFORMED:  1.  Attempted laparoscopic cholecystectomy.  2.  Conversion to open cholecystectomy.  3.  Left groin excisional lymph node biopsy.   SURGEON: Angelia Hazell A. Marina Gravel, MD.   ASSISTANT: Scrub technologists x 2.   TYPE OF ANESTHESIA: General endotracheal.   DRAINS: Jackson-Pratt in the gallbladder fossa, Morison's pouch.   ESTIMATED BLOOD LOSS: Totaling 200 mL.   COUNTS: LAP and needle count correct x 2.   DESCRIPTION OF PROCEDURE: With informed consent, supine position, general endotracheal anesthesia, the patient's abdomen was widely clipped of hair, sterilely prepped and draped with ChloraPrep solution, and the left arm was padded and tucked at his side. Timeout was observed.   A 12 mm blunt Hasson trocar was placed through an open technique with stay sutures being passed through the fascia, pneumoperitoneum being established. The gallbladder was not readily identifiable. The patient was then positioned in reverse Trendelenburg and airplane right side up. A 5 mm bladeless trocar followed by a 12 mm Bladeless trocar was then placed in the epigastric region. Two 5-mm ports were then placed in the right subcostal margin laterally. The omentum was densely adherent to the entire gallbladder body. This was taken down with careful technique utilizing point cautery. The gallbladder was markedly chronically inflamed. Grasping of the gallbladder demonstrated it to tear easily with spillage of small mulberry-appearing stones. At this point, the 5 mm trocar was upstaged to a 12 and these were attempted to be aspirated with a larger cannula. Dissection of the  hepatoduodenal ligament was extremely difficult. The cystic duct was not readily identifiable. The entire hepatoduodenal ligament was involved in a markedly fibrotic chronic inflammatory reaction. After approximately 45 minutes of attempted laparoscopic cholecystectomy including a dome-down technique, which resulted in tearing of the gallbladder and further bleeding from the gallbladder fossa, open technique was then performed as I could not obtain a clear identification of the biliary anatomy.  A right upper quadrant Kocher incision was fashioned with scalpel and electrocautery through musculofascial layers. A self-retaining abdominal wall retractor was placed. Small bowel and colon were packed into the lower abdomen with tapes. Three tapes were then placed on the right side of the liver to elevate the gallbladder fossa into the field. The gallbladder was then taken down in a dome-down fashion with control of bleeding with point cautery and later with the argon beam coagulation device. The cystic duct was then identified. It laid parallel to the common bile duct and what appeared to be an early Mirizzi type syndrome configuration. This was teased away with great care. The cystic artery was identified and suture ligated. A small lymphatic branch was divided between single hemoclips. The cystic duct was then controlled with a right angle clamp and suture-ligated with a 2-0 silk suture followed by hemoclips. The gallbladder was then taken off the field. Small stones were collected. The right upper quadrant was irrigated. At this point there was bleeding from a hepatic small vein within the gallbladder fossa, which was controlled with a figure-of-eight #3-0 silk suture followed by the application of the argon beam coagulation and Surgiflo with thrombin. A 19 mm Blake drain was directed into the space and exited the lowermost port site. The drain site was secured with  nylon. The right upper quadrant was irrigated.  Hemostasis appeared to be adequate. No evidence of bile staining. The omentum was packed into the right upper quadrant. The infraumbilical fascial defect was reapproximated with a figure-of-eight #0 Vicryl suture and the existing stay sutures tied to each other. A two-layer closure was obtained with running #1 PDS. Subcutaneous tissues were irrigated. Skin edges were reapproximated with a skin stapler. Sterile dressings were applied.   Utilizing a new gown, gloves, and surgical instruments, the left groin was sterilely prepped and draped. An incision was fashioned over the mass in the left groin and carried down with electrocautery to the lymph node which was circumferentially excised clamping larger lymphatic channels and small veins and controlling them with #0 Vicryl suture. A specimen was submitted to frozen.   Hemostasis was obtained with point cautery. The wound was then reapproximated in the deep layer with a running 3-0 Vicryl suture, a 4-0 Vicryl subcuticular in the skin, benzoin, Steri-Strips, Telfa, and Tegaderm. The patient was then subsequently extubated and taken to the recovery room in stable and satisfactory condition by anesthesia services.    ____________________________ Jeannette How Marina Gravel, MD FACS mab:at D: 07/12/2014 13:48:37 ET T: 07/12/2014 15:22:14 ET JOB#: [id number]  cc: Elta Guadeloupe A. Marina Gravel, MD, <Dictator> Simonne Come. Inez Pilgrim, MD [facility] Ar[facility] Bettina Gavia MD ELECTRONICALLY SIGNED 07/13/2014 14:53

## 2015-02-02 NOTE — Discharge Summary (Signed)
PATIENT NAME:  Gary [name], Gary [name] MR#:  [id number] DATE OF BIRTH:  25-Sep-1946  DATE OF ADMISSION:  06/13/2014 DATE OF DISCHARGE:  06/15/2014  PRIMARY CARE PHYSICIAN: [name], MD  FINAL DIAGNOSES: 1.  Abdominal pain.  2.  Lymphadenopathy.  3.  Diabetes with hypoglycemia.  4.  Hyperthyroidism.  5.  Gastroesophageal reflux disease.  DISCHARGE MEDICATIONS: I told the patient to half the basal rate of the insulin pump at night to avoid hypoglycemic episodes, pravastatin 40 mg at bedtime, metoclopramide 10 mg at bedtime, lisinopril 20 mg at bedtime, methimazole 10 mg 2 tablets twice a day, Carafate 1 gram 3 times a day, Protonix 40 mg twice a day, metronidazole 500 mg every 8 hours for 7 days, and Cipro 500 mg 1 tablet every 12 hours for 7 days.   DISCHARGE DIET: Low sodium, carbohydrate-controlled diet, regular consistency.   DISCHARGE ACTIVITY: As tolerated.   DISCHARGE FOLLOWUP: Follow up with all the specialists and PCP.   REASON FOR ADMISSION: The patient was admitted June 13, 2014 and discharged June 15, 2014. He was admitted with epigastric abdominal pain, was continued on his Protonix and Carafate. Gastroenterology and surgical consultations and oncology consultations were ordered. The patient had lymphadenopathy on the CT scan with weight loss.   DIAGNOSTIC DATA: EKG: Sinus bradycardia with premature atrial complexes, left axis deviation, right bundle branch block.   Lipase 49. Glucose 184, BUN 7, creatinine 1.16, sodium 143, potassium 3.9, chloride 107, CO2 26, calcium 7.7. Liver function tests normal range. Albumin low at 2.9. White blood cell count 9.3, H and H 15 and 43.6 and platelet count of 189,000. Troponin negative.   Ultrasound of the abdomen showed cholelithiasis, mild gallbladder wall thickening and positive Murphy's sign. Mild common bile duct prominence at 7 mm.   Urinalysis with 2+ leukocyte esterase.   CT scan of the abdomen and pelvis showed cholelithiasis  with pericholecystic haziness, mild intrahepatic and extrahepatic biliary ductal dilation, fairly extensive retroperitoneal and inguinal adenopathy.   HIV test negative. Next two troponins were negative. LDH 153, TSH 3.93, triglycerides 34, HDL 38, LDL 37.   Hepatobiliary scan showed nonvisualization of the gallbladder compatible with cystic duct obstruction and cholecystitis.   HOSPITAL COURSE PER PROBLEM LIST:  1.  For the patient's abdominal pain, possible cholecystitis was diagnosed. Dr. Marina Gravel and Dr. Leanora Cover both saw the patient in consultation. Follow up as outpatient will be needed. I did give the patient Cipro and Flagyl just in case this is gallbladder in nature. Prior to going home, the patient tolerated a regular diet and had no abdominal pain. I spoke with Dr. Leanora Cover about the results of the HIDA scan. He does not believe that this is a common duct obstruction. He does not believe this is an acute surgical issue and can be done as outpatient.  2.  Lymphadenopathy. The patient was seen in consultation by Dr. Inez Pilgrim. Follow up as an outpatient needed. Unable to get PET scan as inpatient because the patient had a HIDA scan. Need to get the PET scan as outpatient. Will likely need a lymph node biopsy for diagnosis. This can be done as outpatient with the surgical followup.  3.  Diabetes with hypoglycemia. I think with the patient's weight loss he needs less insulin. The episodes have been in the middle of the night, early morning. I told him to half his basal rate at night and speak with Dr. Eddie Dibbles. He needs to eat a snack in the evening.  4.  Hyperthyroidism. On methimazole. TSH in the normal range.  5.  Gastroesophageal reflux disease. On PPI and Carafate.  Followup is the key here. He needs to follow up to figure out diagnosis.  TIME SPENT ON DISCHARGE: 40 minutes.   ____________________________ Tana Conch. Leslye Peer, MD rjw:sb D: 06/15/2014 14:39:38 ET T: 06/15/2014 16:15:07  ET JOB#: [id number]  cc: Tana Conch. Leslye Peer, MD, <Dictator> [name], MD Simonne Come. Inez Pilgrim, MD Jeannette How. Marina Gravel, MD Bhakti B. Eddie Dibbles, MD  Marisue Brooklyn MD ELECTRONICALLY SIGNED 06/20/2014 15:17

## 2015-02-02 NOTE — Discharge Summary (Signed)
PATIENT NAME:  Gary [name], Gary [name] MR#:  [id number] DATE OF BIRTH:  [birth year]  DATE OF ADMISSION:  07/12/2014 DATE OF DISCHARGE:  07/19/2014  FINAL DIAGNOSES:  1. Mantle cell lymphoma, diffuse 2. Weight loss.  3. Cholelithiasis, chronic calculus cholecystitis.  4.  Type I Diabetes. 5.  Acute renal failure, resolved.  PRINCIPAL PROCEDURES:  1. Open cholecystectomy and left groin lymph node biopsy on July 12, 2014. 2. Medicine consultation.  3. Endocrine consultation.   HOSPITAL COURSE SUMMARY: The patient had an unremarkable postoperative course. Hemoglobin remained stable. He was seen by Medicine. He was found to have some acute renal failure secondary to acute tubular necrosis from low blood pressure postoperatively despite having normal hemoglobin. This responded to fluids. Creatinine improved. Because of his complex type 1 diabetes, endocrinology did become involved. He was placed briefly on an insulin drip. Drain was eventually removed. Affect and mood was very depressed. His diet was able to be advanced. His creatinine improved and slowly improved to the point that he could be satisfactorily discharged on postoperative day #7 with outpatient followup with me in the office. Medication reconciliation form was performed.   ____________________________ Jeannette How. Marina Gravel, MD FACS mab:hh D: 07/29/2014 19:42:01 ET T: 07/29/2014 21:58:17 ET JOB#: [id number]  cc: Elta Guadeloupe A. Marina Gravel, MD, <Dictator> cc.  Dr Inez Pilgrim, [facility] [place]. Velmer Woelfel A Takota Cahalan MD ELECTRONICALLY SIGNED 07/30/2014 14:47

## 2015-02-02 NOTE — Consult Note (Signed)
PATIENT NAME:  Gary [name], Gary [name] MR#:  [id number] DATE OF BIRTH:  1946/08/01  DATE OF CONSULTATION:  07/12/2014  REFERRING PHYSICIAN:    CONSULTING PHYSICIAN:  Nicholes Mango, MD  PRIMARY CARE PHYSICIAN:  [name], MD  ATTENDING PHYSICIAN:  Sherri Rad, MD   REASON FOR MEDICAL CONSULTATION:  Medical management.  BRIEF HISTORY AND HOSPITAL COURSE:  The patient is a 69 year old male with past medical history of hypothyroidism, insulin-requiring diabetes mellitus, hypertension, hyperlipidemia and GERD.  He is admitted to the hospital after open cholecystectomy and left inguinal lymph node excisional biopsy today. Actually, the patient came in to the outpatient procedure to get lap cholecystectomy done, but he ended up with open cholecystectomy as laparoscopic cholecystectomy was unsuccessful.  Hospitalist team is consulted regarding medical management.  During my examination in PACU the patient is still sedated from general anesthesia.  His blood pressure is low, systolic being at 0000000.  Currently, she is getting normal saline bolus and I have ordered additional 500 mL of normal saline bolus.  I was unable to get any history from the patient.  According to the patient's wife, the patient has an insulin pump regarding his insulin-dependent diabetes mellitus.  The patient was recently seen by Dr. Inez Pilgrim for feeling anorexic and weight loss.     PAST MEDICAL HISTORY: Insulin-dependent diabetes mellitus on insulin pump, hypothyroidism, hyperlipidemia, hypertension, GERD.   HISTORY OF PRESENT ILLNESS: The patient is recently seen by Dr. Inez Pilgrim to feeling anorexic and weight loss. Also, they have noticed or lymphadenopathy. According to Dr. Marylene Land note there is a concern for lymphoma.  The patient's recent echo has revealed ejection fraction of 40% with no history of congestive heart failure.   PAST SURGICAL HISTORY: Colonoscopy in 03/2014, right nephrectomy at age 69, and etiology was unclear.  ALLERGIES:  The patient has no known drug allergies.   PSYCHOSOCIAL HISTORY: Lives at home with wife.  He used to smoke, but quit 45 years ago, but currently he is chewing tobacco.  Occasional intake of alcohol.  No illicit drugs according to the wife,   FAMILY HISTORY: Mother was diagnosed with bilateral breast cancer at age 13.   HOME MEDICATIONS: Potassium chloride extended release 10 mEq 1 capsule p.o. 2 times a day, pantoprazole 40 mg 1 tablet p.o. 2 times a day, NovoLog via insulin pump, metoclopramide 10 mg p.o. at bedtime, Carafate 1 gram p.o. 4 times a day, lisinopril 20 mg 1 tablet p.o. once daily, pravastatin 40 mg once daily, methimazole 10 mg 0.25 tablets once a day at bedtime.  REVIEW OF SYSTEMS:  Unobtainable as the patient is currently sedated.    PHYSICAL EXAMINATION:  VITAL SIGNS:  Blood pressure is 92/70, pulse 82, respirations 16 to 18, pulse oximetry 96%. Temperature afebrile. GENERAL APPEARANCE: Not in acute distress. Moderately built and nourished.  HEENT: Normocephalic, atraumatic. Pupils are sluggishly reacting to light and accommodation. No conjunctival injection, moist mucous membranes.  NECK: Supple. No JVD. No thyromegaly. Range of motion is intact.  LUNGS: Clear breath sounds bilaterally, decreased breath sounds at the bases.  CARDIOVASCULAR: S1, S2 normal. Regular rate and rhythm. No murmurs.  GASTROINTESTINAL: No bowel sounds. The patient is status post open cholecystectomy and left inguinal lymph node excisional biopsy. Incision site is clean bandage.  JP drain is present with approximately 10 to 15 mL of serosanguinous fluid.  NEUROLOGIC:  The patient was sedated but arousable to verbal commands and falling asleep, reflexes are 2+.  Motor and sensory could not  be elicited at this time.  EXTREMITIES: SCDs are present. No cyanosis. No clubbing, 2+ dorsalis pulses.  SKIN: Warm to touch. Normal turgor. No rashes. No lesions.   LABORATORY AND IMAGING STUDIES: According to Dr.  Marylene Land record on 06/22/2014, patient's HIV interpretation is nonreactive.  Ultrasound of the gallbladder which was done on 06/13/2014 has revealed cholelithiasis, mild gallbladder wall thickening and positive sonographic Percell Miller sign that raises concern for acute cholecystitis.  CAT scan of the abdomen and pelvis with contrast done on 06/13/2014 has revealed cholelithiasis with pericholecystic haziness as well as mild intrahepatic and extrahepatic biliary duct dilatation. Findings are suspicious for cholecystitis.  Fairly extensively retroperitoneal and inguinal adenopathy raises concern for lymphoproliferative disorder.  Coronary artery calcification.    Laboratory done on the 07/10/2014 has revealed total protein 6.3, albumin 2.9, bilirubin total was normal, bilirubin direct was normal. Alkaline phosphatase was normal.  AST 48, ALT 34. CBC is normal. The patient is O+, antibody negative.  BMP has revealed glucose of 210. Calcium 8.2.  Anion gap 5.   The rest of the BMP is normal.  Today's Accu-Chek at 61 initially and subsequently it went up to 75 and 186.   ASSESSMENT AND PLAN: A 69 year old Caucasian male admitted to Dr. Algernon Huxley service and had open cholecystectomy today as well as a left inguinal lymph node excisional biopsy was done using the same procedure.  Hospitalist team is consulted for medical management.  During my examination, the patient is still sedated from general anesthesia and I have examined the patient in PACU and I discussed with the wife to get more medical details.  1.  Hypotension secondary to general anesthesia.  I will provide aggressive hydration with IV fluids. Right now patient is getting IV fluid bolus.  We will add another 500 mL bolus and hold antihypertensives.  2.  Open cholecystectomy with left lymph node excisional biopsy, postoperative day #0. Pain management per surgery, Dr. Marina Gravel.  3.  Lymphadenopathy with a CAT scan showing retroperitoneal lymphadenopathy which is  concerning for lymphoproliferative disorder. The patient currently had left-sided inguinal lymph node excisional biopsy postoperative day #0.  We will wait on pathology report. The patient is to follow up with Dr. Inez Pilgrim. We will consult Dr. Inez Pilgrim if needed.  4.  Insulin-requiring diabetes mellitus. The patient was on insulin pump at home which is currently on hold.  We will provide insulin sliding scale for now.  5.  Hypothyroidism. Resume home medication, methimazole once the patient is tolerating p.o.  6.  Hyperlipidemia. Resume home medications as the patient is tolerating p.o.  7.  Hypertension. Currently, the patient is with low blood pressure. We will provide aggressive hydration with IV fluids at bedside.  We will support with pressors and transfer him to stepdown unit.  8.  We will provide gastrointestinal prophylaxis with Pepcid and deep vein thrombosis prophylaxis with his SCDs.   Plan of care was discussed in detail with the patient's wife, who is also medical power of attorney.  She verbalized understanding.   TOTAL TIME SPENT:  45 minutes.    Thank you, Dr. Marina Gravel, for allowing the hospitalist team for medical management of this patient.     ____________________________ Nicholes Mango, MD ag:DT D: 07/12/2014 14:21:26 ET T: 07/12/2014 15:25:58 ET JOB#: [id number]  cc: Nicholes Mango, MD, <Dictator> Nicholes Mango MD ELECTRONICALLY SIGNED 07/13/2014 13:50

## 2015-02-02 NOTE — Consult Note (Signed)
PATIENT NAME:  Gary [name], LAIBLE MR#:  [id number] DATE OF BIRTH:  [birth year]  DATE OF CONSULTATION:  06/13/2014  REFERRING PHYSICIAN:  Dr. Posey Pronto CONSULTING PHYSICIAN:  Theodore Demark, NP  REASON FOR CONSULTATION: GI consult ordered by Dr. Posey Pronto for evaluation of abdominal pain.   HISTORY OF PRESENT ILLNESS: I appreciate consult for this pleasant 69 year old Caucasian man with history of diabetes on insulin plump/hyperlipidemia/hypertension for evaluation of intermittent epigastric pain after eating over the last few months, last occurred yesterday which prompted his visit to the Emergency Department. He states usually his abdominal pain starts in the epigastric region, travels to the midsternal area and up the left side of the chest. Occasionally associated with nausea and vomiting. Reports decreased appetite. States he has lost about 40 pounds over the last 8 months. Unable to predict what foods will produce symptoms, sometimes it is food he has previously tolerated. He has been taking several doses of Maalox to help with symptoms. States this happens on an intermittent basis. He had EGD and colonoscopy done March 2015 by Dr. Vira Agar with findings of mild gastritis, tubular adenoma without high-grade dysplasia, some nodules at the terminal ileum that were considered with lymphoid aggregates. No dysplasia or malignancy, no Helicobacter pylori, esophagus and duodenum were normal. Ultrasound this visit with contracted bladder, several stones and a prominent common bile duct. Has had surgical evaluation, felt not to be acute cholecystitis. Is on for HIDA scan tomorrow. LFTs have been normal. Of note, also his 12-lead EKG has changed since December with some inferior lead ST depression and bradycardia. There is also a right bundle branch block. This was found last December. Follows with Dr. Nehemiah Massed for history of arrhythmia. Did have echocardiogram over the early summer with a decreased left ventricular  function of 40%, no significant valvular disease. Troponin last night was normal. Did have CT of the abdomen and pelvis showing some extrahepatic and intrahepatic hepatobiliary ductal dilation and some lymphadenopathy throughout his body. Also reports stress with his son and sometimes there is no food in the house. Does state he generally eats 1 or 2 meals a day, rarely 3. States now he is feeling some better. He has no vomiting or pain, still feels a little bloated. He denies melena, hematochezia, dyspepsia, hematemesis, problems swallowing.   PAST MEDICAL HISTORY: Hyperlipidemia, hyperthyroidism, diabetes, hypertension, GERD, adenomatous polyps.   PAST SURGICAL HISTORY: Cataract surgery, removal of right kidney mass when he was 69 years old, unsure of the reason.   ALLERGIES: None.   MEDICATIONS: Pravastatin 40 mg p.o. daily, Protonix 40 mg p.o. b.i.d., Reglan 10 mg 1 p.o. at bedtime, methimazole 10 mg b.i.d., lisinopril 10 mg p.o. daily, Carafate 1 gram t.i.d., acetylsalicylic acid [id number] mg p.o. daily.   SOCIAL HISTORY: Chews tobacco, 1 beer a day. No illicits.   FAMILY HISTORY: Father with gallbladder disease, scattered family members with alcoholic cirrhosis. No known colorectal cancer or ulcers. There is a family history of lymphoma and heart disease.   REVIEW OF SYSTEMS: Ten systems reviewed. Significant for fatigue, weakness, weight loss, dyspnea on exertion, intermittent chest pain as noted above, social concerns. Otherwise, unremarkable.   LABORATORY AND DIAGNOSTICS: Most recent laboratories: Glucose 184, BUN 7, creatinine 1.16, sodium 143, potassium 2.9. GFR greater than 60, calcium 7.7, lipase 49, total protein 6.5, albumin 2.9, total bilirubin 0.8, ALP 106, AST 23, ALT 12. Troponin less than 0.02. WBC 9.3, hemoglobin 15, hematocrit 43.6, platelet count 189,000. Red cells normocytic with normal RDW. UA does  show positive leukocytes, bacteria, findings concerning for a UTI. Ultrasound  gallbladder with multiple gallstones, contracted with wall thickness up to 3 mm, common bile duct 7 mm along the proximal and midportion, distal duct is obscured, liver without focal lesion. CT of abdomen and pelvis with contrast showing some emphysema, coronary artery calcification, right inferior axillary node of 1 cm, mild intrahepatic and extrahepatic ductal dilation with the extrahepatic bile duct measuring up to 9 mm, liver otherwise unremarkable. Stones in the gallbladder, pericholecystic haziness, right kidney absent, a small lesion in the left kidney thought to be a cyst; spleen, pancreas, stomach, bowel unremarkable. Multiple enlarged retroperitoneal lymph nodes, pelvic lymph nodes, inguinal lymph nodes. Degenerative disk disease at T11-T12.   PHYSICAL EXAMINATION:  VITAL SIGNS: Most recent vital signs: Temperature 97.7 pulse 85, respiratory rate 18, blood pressure 147/79, oxygen saturation 98% on room air.  GENERAL: Pleasant, well-appearing, obese man in no acute distress.  HEENT: Normocephalic, atraumatic. Sclerae are clear, conjunctivae pink. Mucous membranes intact.  NECK: Supple. No thyromegaly or JVD.  CARDIAC: S1, S2, RRR. No MRG. Do note trace ankle edema.  CHEST: Respirations eupneic. Lungs clear, somewhat decreased at the bases bilaterally.  ABDOMEN: Prominent abdomen. Bowel sounds x 4. Soft, nondistended, nontender. No guarding, rigidity, rebound, tenderness, hepatosplenomegaly or other abnormalities.  EXTREMITIES: No clubbing or cyanosis. Strength 5/5. Sensation appears to be intact.  SKIN: Warm, dry, pink. Do note some scattered ecchymosis to the right forearm and left hand.  NEUROLOGIC: Alert, oriented x 3. Cranial nerves II-XII intact. Speech clear. No facial droop.  PSYCHIATRIC: Pleasant, calm, cooperative. Reasonable insight.   IMPRESSION AND PLAN: Intermittent epigastric pain, nausea, vomiting, feeling better now. Several differential diagnoses including chronic  cholecystitis, gastroparesis, even cardiac concerns given EKG changes or other. These can also cause weight loss. Agree with HIDA scan for now. We will change to without CCK due to presence of gallstones. May need MRCP versus gastric emptying test after this is done. With his lymphadenopathy, do agree with the oncology consult. I did discuss with Dr. Saralyn Pilar. We will put in for cardiac consults. Also with food and social concerns, we will put in for social service evaluation.   Thank you very much for this consult.    These services were provided by Stephens November, MSN, [facility], in collaboration with Lollie Sails, MD with whom I have discussed this patient in full.    ____________________________ Theodore Demark, NP chl:TT D: 06/13/2014 15:18:57 ET T: 06/13/2014 16:02:54 ET JOB#: [id number]  cc: Theodore Demark, NP, <Dictator> [place] SIGNED 06/21/2014 18:42

## 2015-02-02 NOTE — Consult Note (Signed)
Brief Consult Note: Diagnosis: lymphadenopathy, wt loss abdominal pain, biliary ductal dilitation.   Patient was seen by consultant.   Comments: patient seen chart reviewed, exam no palpable adenopathy..plan check ldh, siep, iv, siep, flow cytometry, pet scan..dictated note to follow.  Electronic Signatures: Dallas Schimke (MD)  (Signed 02-Sep-15 23:57)  Authored: Brief Consult Note   Last Updated: 02-Sep-15 23:57 by Dallas Schimke (MD)

## 2015-02-02 NOTE — Consult Note (Signed)
Brief Consult Note: Diagnosis: MANTLE CELL LYMPHOMA.   Patient was seen by consultant.   Discussed with Attending MD.   Comments: DICTATED NOTE TO FOLLOW PATIENT SEEN. DISCUSSED WITH PATHOLOGY.  PLAN OK TO DISCHARGE. F/U CANCER CENTER OCT 16.  Electronic Signatures: Dallas Schimke (MD)  (Signed 08-Oct-15 11:42)  Authored: Brief Consult Note   Last Updated: 08-Oct-15 11:42 by Dallas Schimke (MD)

## 2015-02-02 NOTE — Consult Note (Signed)
PATIENT NAME:  Gary [name], Gary [name] MR#:  [id number] DATE OF BIRTH:  09-Feb-1946  DATE OF CONSULTATION:  07/13/2014  REFERRING PHYSICIAN:  Bettey Costa, MD.   CONSULTING PHYSICIAN:  A. Lavone Orn, MD  CHIEF COMPLAINT: Uncontrolled diabetes.   HISTORY OF PRESENT ILLNESS: This is a 69 year old male with a history of type 1 diabetes, hyperthyroidism, admitted yesterday after open cholecystectomy. I am consulted for management of diabetes. He has a history of type 1 diabetes since age 89. Diabetes is complicated by retinopathy, peripheral neuropathy, and autonomic neuropathy with gastroparesis. The patient follows with Dr. Adella Hare at the [facility] end[facility]ology [facility]. Diabetes is managed with Accu-Chek Spirit  insulin pump, basal settings are as follows: 12:00 a.m. 0.95 units/hour, 6:00 a.m. 1 unit/hour, 10:00 a.m. 1.35 units/hour for a total 24-hour basal rate of 29.65 units. Bolus insulin settings include an insulin to carbohydrate ratio of 1:7, sensitivity factor of 30, target blood sugar 120. Hemoglobin A1c has been in the 6% range over the last year. He has a history of frequent hypoglycemia. Postoperatively, the patient was managed with an insulin pump. Per nursing staff, he was using insulin pump inappropriately and was taking excessive boluses of insulin, provoking hypoglycemia. Yesterday he had a low blood sugar in the 60s recorded on fingerstick testing. Apparently pump was removed last evening, however no basal insulin given until this morning. Blood sugars today have been high in the 2-400 range. He was given Levemir 20 units today at 11:00 a.m.  For high blood sugars, he has been treated with a NovoLog insulin sliding scale. Diet is currently full liquids.   PAST MEDICAL HISTORY:  1. Type 1 diabetes mellitus.  2. Diabetic retinopathy.  3. Diabetic polyneuropathy.  4. Hyperthyroidism.  5. Hyperlipidemia.  6. Hypertension.  7. GERD.   PAST SURGICAL HISTORY:  1. Open cholecystectomy  07/12/2014.   2. Colonoscopy in June 2015.  3. Right nephrectomy, 1951.   ALLERGIES: No known drug allergies.   SOCIAL HISTORY: The patient is married. No tobacco use.   FAMILY HISTORY: Per records, positive for breast cancer.   CURRENT MEDICATIONS:  1. Methimazole 5 mg daily.  2. Docusate 100 mg daily.  3. Pantoprazole 40 mg b.i.d.  4. Reglan 10 mg at bedtime.  5. Pravachol 40 mg daily.  6. Carafate 1 gram t.i.d. a.c. and at bedtime.  7. NovoLog insulin sliding scale 6 units per 50 of blood sugar over a target of 200.  8. Patient controlled analgesic with morphine.   REVIEW OF SYSTEMS:  GENERAL: Positive for weight loss. No fever.  HEENT: Denies blurred vision. Denies sore throat.  NECK: Denies neck pain. Denies dysphagia.  CARDIAC: Denies chest pain. Denies palpitation.  PULMONARY: Denies cough. Denies shortness of breath.  ABDOMEN: Has incisional postoperative discomfort. Reports good appetite.  GENITOURINARY: Denies dysuria or hematuria ENDOCRINE: Denies heat or cold intolerance.  SKIN: Denies rash or recent skin changes.   PHYSICAL EXAMINATION:  VITAL SIGNS: Height 70.9 inches, weight 220 pounds, BMI 30.7, temperature 98.5, pulse 96, blood pressure 106/63, respiratory rate 17, O2 saturation 95% on room air.  GENERAL: Obese white male in no acute distress.  HEENT: EOMI. Oropharynx is clear. Mucus membranes moist.  NECK: Supple. No appreciable thyromegaly.  CARDIAC: Regular rate and rhythm.  PULMONARY: Clear to auscultation bilaterally. No wheeze.  ABDOMEN: Diffuse mild tenderness.  EXTREMITIES: No peripheral edema is present.  NEUROLOGIC: Alert and oriented.  PSYCHIATRIC: Cooperative.   LABORATORY DATA: Glucose 436, BUN 29, creatinine 2.38, potassium  5.8, CO2 18, chloride 108, anion gap normal at 10.   ASSESSMENT: A 69 year old male with type 1 diabetes complicated by polyneuropathy and retinopathy as well as history of Grave hyperthyroidism, now postoperative from  cholecystectomy with uncontrolled blood sugars.   PLAN:  1.  As the patient has already been given judicious amounts of NovoLog today, I recommend close blood sugar monitoring q. 2 hours for hypoglycemia. Will adjust his sliding scale to more closely match target and sensitivity as determined on insulin pump.  2.  Continue basal insulin with Levemir insulin, plan to change this to 30 units, to be given starting tomorrow a.m.  3.  Needs close monitoring given history of frequent hypoglycemia and good sensitivity to insulin.  4.  We will also give low dose prandial insulin of 2 units, as currently on mostly liquids only. This will be advanced as diet is advanced appropriately.  5.  Continue methimazole 5 mg daily, last TSH was in good range and hyperthyroidism is controlled.   Thank you for the kind consultation. I will continue to follow him over the weekend.    ____________________________ A. Lavone Orn, MD ams:bu D: 07/13/2014 17:31:19 ET T: 07/13/2014 18:36:48 ET JOB#: [id number]  cc: A. Lavone Orn, MD, <Dictator> Sherlon Handing MD ELECTRONICALLY SIGNED 08/07/2014 17:49

## 2015-02-02 NOTE — Consult Note (Signed)
Chief Complaint:  Subjective/Chief Complaint Please see full GI consult and brief consult note.  patietn admitted with abdominal pain and finding of  cholelithiasis.   Pain epigastric with radiation to the back and toward left chest.  Patient with h/o DM 1, and uses currently an insulin pump.  Patient seen at [facility] 10-15 years ago and hd diagnosis of gastroparesis ("paralyzed stomach"), taking 10 mg reglan at bedtime, though ran out a week or two ago.  currently denies abdominal pain, but has some nausea. no emesis. Positive h/o cardiac concern and CT showing extensive mild lymphadenopathy.  Awaiting cardiology and heme/onc consult.  Awaiting results of hepatobiliary study.  Of note patietn had an egd in 12/2013 showing only mild gastritis and has been on ppi/carafate since.  My concern is for intermittant exacerbations of chronic cholecystitis.  If hepatobiliary study negative, will do MRCP. Will follow with you.   Brief Assessment:  Cardiac Regular   Respiratory clear BS   Gastrointestinal details normal Soft  Nontender  Nondistended  No masses palpable  Bowel sounds normal   Lab Results: Hepatic:  02-Sep-15 02:26   Bilirubin, Total 0.8  Alkaline Phosphatase 106 (46-116 NOTE: New Reference Range 05/01/14)  SGPT (ALT)  12 (14-63 NOTE: New Reference Range 05/01/14)  SGOT (AST) 23  Total Protein, Serum 6.5  Albumin, Serum  2.9  Cardiology:  02-Sep-15 02:21   Ventricular Rate 56  Atrial Rate 56  P-R Interval 180  QRS Duration 126  QT 500  QTc 482  P Axis 45  R Axis -33  T Axis -32  ECG interpretation Sinus bradycardia with Premature atrial complexes Left axis deviation Right bundle branch block Abnormal ECG When compared with ECG of 15-Sep-2013 06:47, Premature atrial complexes are now Present Vent. rate has decreased BY  47 BPM ST now depressed in Inferior leads T wave inversion less evident in Anterior leads ----------unconfirmed---------- Confirmed by OVERREAD, NOT ([id number]),  editor PEARSON, BARBARA ([id number]) on 06/13/2014 10:09:16 AM  Routine Chem:  02-Sep-15 02:26   Glucose, Serum  184  BUN 7  Creatinine (comp) 1.16  Sodium, Serum 143  Potassium, Serum  2.9  Chloride, Serum 107  CO2, Serum 26  Calcium (Total), Serum  7.7  Osmolality (calc) 288  eGFR (African American) >60  eGFR (Non-African American) >60 (eGFR values <44m/min/1.73 m2 may be an indication of chronic kidney disease (CKD). Calculated eGFR is useful in patients with stable renal function. The eGFR calculation will not be reliable in acutely ill patients when serum creatinine is changing rapidly. It is not useful in  patients on dialysis. The eGFR calculation may not be applicable to patients at the low and high extremes of body sizes, pregnant women, and vegetarians.)  Anion Gap 10  Lipase  49 (Result(s) reported on 13 Jun 2014 at 03:03AM.)  Cardiac:  02-Sep-15 02:26   Troponin I < 0.02 (0.00-0.05 0.05 ng/mL or less: NEGATIVE  Repeat testing in 3-6 hrs  if clinically indicated. >0.05 ng/mL: POTENTIAL  MYOCARDIAL INJURY. Repeat  testing in 3-6 hrs if  clinically indicated. NOTE: An increase or decrease  of 30% or more on serial  testing suggests a  clinically important change)    14:51   Troponin I < 0.02 (0.00-0.05 0.05 ng/mL or less: NEGATIVE  Repeat testing in 3-6 hrs  if clinically indicated. >0.05 ng/mL: POTENTIAL  MYOCARDIAL INJURY. Repeat  testing in 3-6 hrs if  clinically indicated. NOTE: An increase or decrease  of 30% or more on serial  testing suggests a  clinically important change)  Routine UA:  02-Sep-15 08:23   Color (UA) Amber  Clarity (UA) Cloudy  Glucose (UA) 50 mg/dL  Bilirubin (UA) Negative  Ketones (UA) 1+  Specific Gravity (UA) 1.018  Blood (UA) 1+  pH (UA) 6.0  Protein (UA) Negative  Nitrite (UA) Negative  Leukocyte Esterase (UA) 2+ (Result(s) reported on 13 Jun 2014 at 09:10AM.)  RBC (UA) 8 /HPF  WBC (UA) 27 /HPF  Bacteria (UA) 3+   Epithelial Cells (UA) <1 /HPF  Mucous (UA) PRESENT  Amorphous Crystal (UA) PRESENT (Result(s) reported on 13 Jun 2014 at 09:10AM.)  Routine Hem:  02-Sep-15 02:26   WBC (CBC) 9.3  RBC (CBC) 4.86  Hemoglobin (CBC) 15.0  Hematocrit (CBC) 43.6  Platelet Count (CBC) 189  MCV 90  MCH 30.8  MCHC 34.3  RDW 14.5  Neutrophil % 82.1  Lymphocyte % 11.8  Monocyte % 4.9  Eosinophil % 0.6  Basophil % 0.6  Neutrophil #  7.6  Lymphocyte # 1.1  Monocyte # 0.5  Eosinophil # 0.1  Basophil # 0.1 (Result(s) reported on 13 Jun 2014 at 02:39AM.)   Radiology Results: [place]:    02-Sep-15 05:17, US Abdomen Limited Survey  US Abdomen Limited Survey   REASON FOR EXAM:    epigastric, ruq pain eval GB  COMMENTS:   Body Site: Gallbladder, Liver, Common Bile Duct    PROCEDURE: [place]  - US ABDOMEN LIMITED SURVEY  - Jun 13 2014  5:17AM     CLINICAL DATA:  Epigastric and right upper quadrant pain. History of  right nephrectomy.    EXAM:  US ABDOMEN LIMITED - RIGHT UPPER QUADRANT    COMPARISON:  04/17/2013    FINDINGS:  Technically challenging examination secondary to patient body  habitus pre    Gallbladder:    Multiple gallstones. Contracted gallbladder with wall thickness up  to 3 mm. Per the sonographer, positive sonographic Murphy sign.    Common bile duct:    Diameter: 7 mm new along the proximal and mid portion. The distal  duct is obscured.    Liver:No focal lesion identified. Left lobe againnot visualized.  Otherwise, within normal limits in parenchymal echogenicity.   IMPRESSION:  Cholelithiasis, mild gallbladder wall thickening, and positive  sonographic Murphy sign, raises concern for acute cholecystitis.    Mild CBD prominence at 7 mm along the proximal and midportion.  Distal duct obscured. Correlate with LFTs and ERCP as warranted.      Electronically Signed    By: Carlos Levering M.D.    On: 06/13/2014 05:30         Verified By: Tommi Rumps, M.D.,  CT:     02-Sep-15 08:47, CT Abdomen and Pelvis With Contrast  CT Abdomen and Pelvis With Contrast   REASON FOR EXAM:    (1) abd pain eval; (2) abd pain eval  COMMENTS:       PROCEDURE: CT  - CT ABDOMEN / PELVIS  W  - Jun 13 2014  8:47AM     CLINICAL DATA:  Mid upper abdominal pain with shortness of breath  and nausea.    EXAM:  CT ABDOMEN AND PELVIS WITH CONTRAST    TECHNIQUE:  Multidetector CT imaging of the abdomen and pelvis was performed  using the standard protocol following bolus administration of  intravenous contrast.  CONTRAST:  75 cc Isovue 300.    COMPARISON:  Ultrasound abdomen 06/13/2014.    FINDINGS:  Lung  bases show no acute findings. Scattered scarring and paraseptal  emphysema. Heart is at the upper limits of normal in size. Coronary  artery calcification. No pericardial or pleural effusion. Right  inferior axillary lymph node measures 10 mm.    Mild intrahepatic and extrahepatic biliary duct dilatation with the  extrahepatic bile duct measuring up to 9 mm. Liver is otherwise  unremarkable. Stones are seen in the gallbladder with mild  pericholecystic haziness. Adrenal glands are unremarkable. Right  kidney is absent. 9 mm exophytic lesion off the lower pole left  kidney is too small to definitively characterize but statistically,  a cyst is most likely. Spleen, pancreas, stomach and bowel are  unremarkable.    There are multiple enlarged retroperitoneal lymph nodes. Index left  periaortic lymph node measures 1.2 cm. Pelvic lymph nodes measure up  to 1.8 cm in the left external iliac station. Inguinal lymph nodes  measure up to 1.8 cm on the left, incompletely imaged.    Scattered atherosclerotic calcification of the arterial vasculature  without abdominal aortic aneurysm. No free fluid. Bladder is  markedly decompressed, limiting evaluation. Prostate is  unremarkable. Right hemidiaphragm is elevated. No worrisome lytic or  sclerotic lesions. Degenerative disc  disease that T11-12.   IMPRESSION:  1. Cholelithiasis with pericholecystic haziness as well as mild  intrahepatic and extrahepatic biliary duct dilatation, as discussed  on abdominal ultrasound performed the same day. Findings are  suspicious for acute cholecystitis. No definite choledocholithiasis.  2. Fairly extensive retroperitoneal and inguinal adenopathy raises  concern for a lymphoproliferative disorder.  3. Coronary artery calcification.      Electronically Signed    By: Lorin Picket M.D.    On: 06/13/2014 09:13       Verified By: Luretha Rued, M.D.,   Electronic Signatures: Loistine Simas (MD)  (Signed 02-Sep-15 20:02)  Authored: Chief Complaint, Brief Assessment, Lab Results, Radiology Results   Last Updated: 02-Sep-15 20:02 by Loistine Simas (MD)

## 2015-02-02 NOTE — Consult Note (Signed)
PATIENT NAME:  Gary [name], Gary [name] MR#:  [id number] DATE OF BIRTH:  1946/01/05  DATE OF CONSULTATION:  06/13/2014  CONSULTING PHYSICIAN:  Simonne Come. Gittin, MD  HISTORY OF PRESENT ILLNESS: Gary [name] is a 69 year old man who was admitted on September 2 with abdominal pain and also found to have lymphadenopathy. He was evaluated on that day, brief note placed on the chart, wanted some laboratory studies for the following day and followed up, although this narrative is delayed until the current day. Gary [name] was admitted with epigastric pain. It was also lower chest, diffuse, across his arms and shoulders, so he had cardiology consultation, although pain was demonstrated to be noncardiac in nature. He had had nausea and vomiting. He had had some pains for a few weeks. He had had GI evaluation as long ago as March for similar symptoms.   He had a 35 to 40 pound weight loss over the past 6 to 8 months with intermittent nausea and some time poor appetite. He had a colonoscopy also back in March, which was done as a routine screening. There were no lower abdominal symptoms or any blood in the stools at that time. He has been found to have some intrahepatic ductal dilatation, question of cholecystitis, a HIDA scan was planned and surgery has seen and evaluated the patient, put on proton pump inhibitors and from the surgical point of view, followed with antibiotic followed with CT scan and clinically surgery was not currently planned or recommended.   PAST MEDICAL HISTORY: The patient with primary care with Dr. Nicki Reaper, cardiology with Dr. Nehemiah Massed, has had some questionable arrhythmia in the past. He has a bundle branch block. He has had an echocardiogram. He has a low ejection fraction, prior demonstrated on echocardiogram. He is unaware of any heart failure history. He has a history that includes kidney mass as a child, unknown diagnosis and cataract surgeries. He has hyperlipidemia, hypothyroid, diabetes on insulin pump,  hypertension and GERD.   ALLERGIES: No known allergies.   MEDICATIONS: At the time of admission was pravastatin 40 daily, Protonix 40 twice a day, Reglan 10 at night, methimazole 20 mg twice a day, lisinopril 10 mg daily, Carafate 1 gram 3 times a day.   SOCIAL HISTORY: Chews tobacco. No smoke. Alcohol: One beer daily.   FAMILY HISTORY: Had a few family illnesses with some lymphoma. He did not have details. He reports thyroid cancer in the past and a family with breast cancer. His mother was in her 33s and had bilateral breast cancer.   REVIEW OF SYSTEMS: He did not have fever or chills. Does have weakness and weight loss as noted. No ear or jaw pain. As noted, chest pain with some radiating to the shoulders upon admission that was significantly improved. No cough, wheezing, hemoptysis. No blood in stools. No diarrhea or constipation. No dysuria, hematuria or prostate disorder. He has hypothyroid and has no history of anemia, easy bleeding or bruising. No significant bone pain. No history of seizure, stroke, anxiety or depression.   PHYSICAL EXAMINATION: GENERAL: He was alert and cooperative, somewhat anxious, was not in acute distress, was having some abdominal discomfort, was nauseated.  HEENT: Sclerae: No jaundice. Mouth: There was no thrush.  NECK: No mass.  LYMPHATIC: There was no palpable lymph node in the neck, cervical or axilla.  HEART: Regular.  LUNGS: Clear. No wheezing or rales.  ABDOMEN: Was not tender when seen. No palpable organomegaly.  EXTREMITIES: No extremity edema.  NEUROLOGIC: Grossly nonfocal.  Cranial nerves intact.  PSYCHIATRIC: Mood and affect were unremarkable.  LABORATORY DATA: On admission, the creatinine was 1.16. Potassium was low at 2.9. Lipase was 49. Liver functions were unremarkable. Troponin was low. White count was 9.3, hemoglobin was 15, platelets 189,000. He had an abnormal urinalysis, positive for bacteria and leukocytes.   DIAGNOSTIC DATA: CT of the  abdomen and pelvis done with contrast showed a 1 cm right axillary lymph node and mild intrahepatic-gastrohepatic biliary duct dilation. Liver otherwise normal. Spleen and intra-abdominal organs otherwise normal. Also enlarged retroperitoneal lymph nodes up to 1.2 cm and pelvic lymph nodes up to 1.8 cm. The left external iliac and some inguinal nodes were up to 1.8 cm. He had elevated right hemidiaphragm and degenerative disk disease.   IMPRESSION: The patient with gastrointestinal symptoms, recent esophagogastroduodenoscopy and colonoscopy, he clearly had a gastroesophageal reflux disease. Not having significant abdominal pain now, has had it intermittently, radiated to the chest, which appeared noncardiac. Acute cardiac issues were removed out. Possibility of cholecystitis. Surgery will follow. Diffuse adenopathy suspicious for lymphoma. He has a family history significant that his mother had bilateral breast cancer at a young age and also some family members with lymphoma.   PLAN: The patient will continue surgery follow-up. We will plan to obtain a PET scan, a chronic lymphocytic leukemia profile that was ordered. An SIEP that was ordered. HIV study. This patient has demonstrated lymphoma and if his treatment would include rituximab for B-cell lymphoma, we would later want to document the hepatitis serologies. Check an LDH. Serum electrolytes serially. Also notable is the cardiac history. We would probably later get a recent echocardiogram and if the patient has abnormal left ventricular function, this is significant in treatment choice, potentially for lymphoma. Looks like, based on the CT, he probably would have an easily accessible inguinal lymph node for ultimate biopsy.      ____________________________ Simonne Come Inez Pilgrim, MD rgg:TT D: 06/23/2014 14:32:25 ET T: 06/23/2014 15:26:23 ET JOB#: [id number]  cc: Simonne Come. Inez Pilgrim, MD, <Dictator> Dallas Schimke MD ELECTRONICALLY SIGNED 07/10/2014 14:12

## 2015-02-02 NOTE — Consult Note (Signed)
PATIENT NAME:  Gary [name], Gary [name] MR#:  [id number] DATE OF BIRTH:  23-Jan-1946  DATE OF CONSULTATION:  06/13/2014  REFERRING PHYSICIAN:  Emergency Room CONSULTING PHYSICIAN:  Elta Guadeloupe A. Marina Gravel, MD  HISTORY: This is a 69 year old male with type 1 diabetes, obesity and hyperthyroidism, who presents to the Emergency Room with a rather sudden onset of epigastric and left upper quadrant abdominal pain radiating to his left back, left shoulder and left arm, associated with 1 episode of emesis. He had a similar episode several months ago treated with GI cocktail in the Emergency Room. Of note, the patient had an upper endoscopy in March of this year by Dr. Vira Agar, which demonstrated normal esophagus and duodenum, but erythematous mucosa in the gastric body, as well as a colonoscopy on the same day, which demonstrated otherwise unremarkable examination except for hemorrhoids. Of note, the patient had a right nephrectomy at the age of 4 through a right flank incision.   ALLERGIES: None.   MEDICATIONS: Include: Insulin pump, Carafate, lisinopril, methimazole, Reglan, Protonix, and pravastatin.   PAST MEDICAL HISTORY: Hypertension, gastroesophageal reflux,  hyperthyroidism, hypercholesterolemia and hyperlipidemia.   PAST SURGICAL HISTORY: Right nephrectomy through a right flank incision at the age of 4; unknown etiology for surgery.   REVIEW OF SYSTEMS: As described above.   FAMILY HISTORY: Noncontributory.   PHYSICAL EXAMINATION:  GENERAL: This is a chronically ill-appearing male in no distress.  VITAL SIGNS: Temperature is 97.6, pulse 56, blood pressure 176/82.  LUNGS: Clear.  HEART: Regular rate and rhythm.  ABDOMEN: Soft and nontender. There is no Murphy sign. There is an insulin pump in place. There are no obvious hernias. There is a small right flank incision from previous nephrectomy.   EXTREMITIES: Warm and well perfused.  NEUROLOGIC: Unremarkable. PSYCHIATRIC: Unremarkable.   LABORATORY VALUES:  Urinalysis is pending. Glucose 184, potassium 2.9. Lipase is 49, bilirubin 0.8, alkaline phosphatase 106, AST 23, ALT 12. Troponin normal. White count 9.3, hemoglobin 15, platelet count 189,000, increased neutrophil count on the differential.   REVIEW OF X-RAYS: Ultrasound demonstrates multiple gallstones, contracted gallbladder with wall thickness up to 3 mm, bile duct 7 mm, distal duct was obscured; technically difficult study.   IMPRESSION: Abdominal pain with symptoms somewhat atypical for acute cholecystitis. The patient had an abnormal EGD in March, an upper endoscopy. At this point, I do not believe the patient has acute cholecystitis as symptoms complex rather atypical but certainly diabetes have often an unusual presentation.  RECOMMENDATIONS: Medical admission, CT scan of the abdomen and pelvis. Follow up on urinalysis, which I have assured has been ordered.   I discussed this with Dr. Dahlia Client of the Emergency Room staff, and the patient agrees with this plan.    ____________________________ Jeannette How. Marina Gravel, MD mab:MT D: 06/13/2014 07:04:22 ET T: 06/13/2014 09:52:26 ET JOB#: [id number]  cc: Elta Guadeloupe A. Marina Gravel, MD, <Dictator> Mishaal Lansdale A Ahijah Devery MD ELECTRONICALLY SIGNED 06/13/2014 21:01

## 2015-02-02 NOTE — Consult Note (Signed)
Brief Consult Note: Diagnosis: abdominal pain, atypical symptoms for cholecystitis.   Patient was seen by consultant.   Consult note dictated.   Recommend further assessment or treatment.   Orders entered.   Discussed with Attending MD.   Comments: recommend CT scan abd/pelvis if negative then either HIDA and/or GI eval. recommend medicine admission,  we will follow as consult.  Electronic Signatures: Sherri Rad (MD)  (Signed 02-Sep-15 07:00)  Authored: Brief Consult Note   Last Updated: 02-Sep-15 07:00 by Sherri Rad (MD)

## 2015-02-02 NOTE — Consult Note (Signed)
Chief Complaint:  Subjective/Chief Complaint patient seen for abdominal pain. feeling beter today, no pain, no n/v, tolerating clears.  had HIDA done.   VITAL SIGNS/ANCILLARY NOTES: **Vital Signs.:   03-Sep-15 07:51  Vital Signs Type Q 8hr  Temperature Temperature (F) 98.5  Celsius 36.9  Pulse Pulse 100  Respirations Respirations 18  Systolic BP Systolic BP 545  Diastolic BP (mmHg) Diastolic BP (mmHg) 63  Mean BP 78  Pulse Ox % Pulse Ox % 95  Pulse Ox Activity Level  At rest  Oxygen Delivery Room Air/ 21 %   Brief Assessment:  Cardiac Regular   Respiratory clear BS   Gastrointestinal details normal Soft  Nontender  Nondistended  No masses palpable  Bowel sounds normal   Lab Results: Thyroid:  03-Sep-15 04:04   Thyroid Stimulating Hormone 3.93 (0.45-4.50 (IU = International Unit)  ----------------------- Pregnant patients have  different reference  ranges for TSH:  - - - - - - - - - -  Pregnant, first trimetser:  0.36 - 2.50 uIU/mL)  Routine Micro:  03-Sep-15 04:04   Micro Text Report HIV 1/2 AG AB COMBO   HIV 1/2 ANTIBODIES        NON-REACTIVE ANTIBODY   HIV-1 p24 ANTIGEN         NON-REACTIVE ANTIGEN   INTERPRETATION            NONREACTIVE.  A NONREACTIVE test result means that HIV-1 or HIV-2 antibodies and HIV-1 p24 antigen were not detected in the specimen.   ANTIBIOTIC                       Routine Chem:  03-Sep-15 04:04   LDH, Serum 153 (Result(s) reported on 14 Jun 2014 at 04:58AM.)  Glucose, Serum  51  BUN  6  Creatinine (comp) 1.01  Sodium, Serum 145  Potassium, Serum  3.1  Chloride, Serum 107  CO2, Serum  33  Calcium (Total), Serum  6.9  Anion Gap  5  Osmolality (calc) 284  eGFR (African American) >60  eGFR (Non-African American) >60 (eGFR values <36m/min/1.73 m2 may be an indication of chronic kidney disease (CKD). Calculated eGFR is useful in patients with stable renal function. The eGFR calculation will not be reliable in acutely ill  patients when serum creatinine is changing rapidly. It is not useful in  patients on dialysis. The eGFR calculation may not be applicable to patients at the low and high extremes of body sizes, pregnant women, and vegetarians.)  Result Comment calcium - NOTIFIED OF CRITICAL VALUE  - RESULTS VERIFIED BY REPEAT TESTING.  - called to [facility] - 06/14/2014..Marland KitchenMarland KitchenPL  - READ-BACK PROCESS PERFORMED.  Result(s) reported on 14 Jun 2014 at 05:08AM.  Cholesterol, Serum 82  Triglycerides, Serum 34  HDL (INHOUSE)  38  VLDL Cholesterol Calculated 7  LDL Cholesterol Calculated 37 (Result(s) reported on 14 Jun 2014 at 04:58AM.)  Routine Sero:  03-Sep-15 04:04   - HIV 1/2 Antibodies NON-REACTIVE ANTIBODY  - HIV-1 p24 Antigen NON-REACTIVE ANTIGEN  - Interpretation NONREACTIVE.  A NONREACTIVE test result means that HIV-1 or HIV-2 antibodies and HIV-1 p24 antigen were not detected in the specimen.  Result(s) reported on 14 Jun 2014 at 05:12AM.  Routine Hem:  03-Sep-15 04:04   WBC (CBC) 5.5  RBC (CBC)  4.06  Hemoglobin (CBC)  12.5  Hematocrit (CBC)  37.3  Platelet Count (CBC) 158  MCV 92  MCH 30.9  MCHC 33.7  RDW  14.7  Neutrophil % 59.4  Lymphocyte % 27.0  Monocyte % 9.4  Eosinophil % 3.0  Basophil % 1.2  Neutrophil # 3.3  Lymphocyte # 1.5  Monocyte # 0.5  Eosinophil # 0.2  Basophil # 0.1 (Result(s) reported on 14 Jun 2014 at 05:08AM.)   Assessment/Plan:  Assessment/Plan:  Assessment 1) abdominal pain,n/v. much improved.  Evidence of cholelithiasis and estensive lymphadenopathy on ct.  awaiting results of hida and oncology evaluation. Will advance diet to full liquids.   Plan as above.   Electronic Signatures: Loistine Simas (MD)  (Signed 03-Sep-15 15:08)  Authored: Chief Complaint, VITAL SIGNS/ANCILLARY NOTES, Brief Assessment, Lab Results, Assessment/Plan   Last Updated: 03-Sep-15 15:08 by Loistine Simas (MD)

## 2015-02-02 NOTE — Consult Note (Signed)
PATIENT NAME:  Gary [name], Gary [name] MR#:  [id number] DATE OF BIRTH:  10-04-1946  DATE OF CONSULTATION:  06/13/2014  REFERRING PHYSICIAN:  Lollie Sails, MD CONSULTING PHYSICIAN:  Isaias Cowman, MD  PRIMARY CARE PHYSICIAN: [name], MD  CARDIOLOGIST: Corey Skains, MD  CHIEF COMPLAINT: Abdominal pain.   HISTORY OF PRESENT ILLNESS: The patient is a 69 year old gentleman with history of intermittent epigastric pain. He presented to [facility] Emergency Room on 06/13/2014, with progressive epigastric discomfort which radiated up into his chest and across his chest and both arms. The patient has had previous episodes in the past treated with GI cocktail. EKG revealed sinus rhythm with right bundle branch block. Initial troponin was less than 0.02 with follow-up troponin less than 0.02.   PAST MEDICAL HISTORY: 1. Gastroesophageal reflux disease.  2. Diabetes.  3. Hypertension.  4. Moderately reduced left ventricular function with evidence of mild pulmonary hypertension.   MEDICATIONS: Insulin pump, Carafate 1 gram t.i.d., lisinopril 20 mg daily,  Tapazole 20 mg b.i.d., metoclopramide 10 mg at bedtime, Protonix 40 mg daily, pravastatin 40 mg at bedtime.   SOCIAL HISTORY: The denies tobacco or EtOH abuse.   FAMILY HISTORY: No immediate family history of coronary artery disease or myocardial infarction.   REVIEW OF SYSTEMS:  CONSTITUTIONAL: No fever or chills.  EYES: No blurry vision.  EARS: No hearing loss.  RESPIRATORY: No shortness of breath.  CARDIOVASCULAR: No chest pain with the exception of mid-epigastric discomfort which radiates into his chest.  GASTROINTESTINAL: The patient had abdominal discomfort as described above.  GENITOURINARY: No dysuria or hematuria.  ENDOCRINE: No polyuria or polydipsia.  MUSCULOSKELETAL: No arthralgias or myalgias.  NEUROLOGICAL: No focal muscle weakness or numbness.  PSYCHOLOGICAL: No depression or anxiety.   PHYSICAL EXAMINATION: VITAL  SIGNS: Blood pressure 126/71, pulse 79, respirations 18, temperature 97.7, pulse oximetry 98%.  HEENT: Pupils equal, reactive to light and accommodation.  NECK: Supple without thyromegaly.  LUNGS: Clear.  HEART: Normal JVP. Normal PMI. Regular rate and rhythm. Normal S1, S2. No appreciable gallop, murmur, or rub.  ABDOMEN: Soft and nontender. Pulses were intact bilaterally.  MUSCULOSKELETAL: Normal muscle tone.  NEUROLOGIC: The patient is alert and oriented x3. Motor and sensory both grossly intact.   IMPRESSION: A 69 year old gentleman with mid epigastric pain, negative troponin, nondiagnostic EKG was sinus rhythm, right bundle branch block, which sounds noncardiac in nature.   RECOMMENDATIONS: 1. Agree with overall current therapy.  2. Would defer full dose anticoagulation.  3. Would defer cardiac diagnostics at this time.  4. May consider outpatient functional study once recurrent GI symptoms have resolved.      ____________________________ Isaias Cowman, MD ap:lm D: 06/13/2014 16:23:45 ET T: 06/13/2014 17:31:43 ET JOB#: [id number]  cc: Isaias Cowman, MD, <Dictator> Isaias Cowman MD ELECTRONICALLY SIGNED 06/14/2014 16:02

## 2015-02-02 NOTE — Consult Note (Signed)
Brief Consult Note: Diagnosis: epigastric pain.   Patient was seen by consultant.   Consult note dictated.   Comments: Appreciate consult for pleasant 69 y/o caucasian man with history of DM on insulin pump/HL/HTN for evaluation of  intermittent epigastric pain after eating over the last few months. last occurred yesterday, states usually the pain starts in the epigastric region, travels to the midsternal area and up the left side of the chest. occasionally associated with nausea and vomiting. reports a decreased appetite. states he has lost about 40lb over the last 67m.  Unable to predict what foods will produce symptoms. sometimes it is foods that he has previously tolerated. Has been taking several doses of Maalox to help with symptoms. States this happens on an intermittent basis. Had EGD and colonoscopy 3/15 by Dr Tiffany Kocher with the findings of mild gastritis, a tubular adenoma w/o HGD, some nodules at the TI that were consistent with lymphoid aggregates. No D/M. No H pylori. Esophagus and duodenum were nml.  [place] w/ contracted bladder and prominent CBD: has had surgical evaluation, felt not to be acute cholecystitis. Is on for a HIDA scan tomorrow. LFTs have been normal.Of note also, his 12ld EKG has changed since December, with some inferior lead ST depression and bradycardia. There is also a RBB which was found last Dec. Follows w/ Dr Erin Fulling for arrythmias. Troponin last night was normal.  CT w/ lymphadenop & ductal changes in hepatol bil system. Reports some stress w/son and sometimes no food in the house. Eats one- 2 meals/d. Rarely 3. Feeling some better. No vomiting/pain now, feels sl bloated. Abd benign on exam. Denies melena/hematochezia/hematemesis, problems swallowing. Impression and plan: Int Epigastric pain/NV. Better now. Several ddx: chronic cholecystitis, gastroparesis, even cardiac concern given EKG changes. These can cause wt loss.  Agree with HIDA for now- will change to w/o CCK due to  presence of gallstones. ?MRCP v. gastric emptying test after. Agree w/ onc cx. Discussed with Dr Saralyn Pilar, will put in for card cx. w/ food concerns will put in for ss eval..  Electronic Signatures: Theodore Demark (NP)  (Signed 02-Sep-15 15:02)  Authored: Brief Consult Note   Last Updated: 02-Sep-15 15:02 by Theodore Demark (NP)

## 2015-02-02 NOTE — H&P (Signed)
PATIENT NAME:  Gary [name], Gary [name] MR#:  [id number] DATE OF BIRTH:  11/07/1945  DATE OF ADMISSION:  06/13/2014   PRIMARY CARE PROVIDER: [name], MD  EMERGENCY DEPARTMENT REFERRING PHYSICIAN: Conni Slipper, MD  CHIEF COMPLAINT: Epigastric pain.   HISTORY OF PRESENT ILLNESS: The patient is a 69 year old white male with a history of hyperlipidemia, hypothyroidism, diabetes on an insulin pump, hypertension and GERD, who states that over the past few weeks he has been having episodes of sharp epigastric pain after eating. It does not happen every time he eats, but he has had 3 episodes of these and every time the pain has gotten worse. The patient presented initially with these symptoms, which started around 2:00 a.m. He describes the pain as a sharp pain in his epigastric region, radiating to the back of his sternum up to his chest. He was also very nauseous and threw up 3 times. He continued to have the symptoms; therefore, came to the ED.   Initial evaluation in the ED included a CT scan of the abdomen, which showed cholelithiasis with pericholecystic haziness, and some biliary duct dilation. He also had fairly extensive retroperitoneal and inguinal lymphadenopathy. The patient was seen by surgery, and they did not feel that he had acute cholecystitis. They recommended that we admit the patient and obtain a  HIDA scan. The patient reports that he has also had a significant weight loss of approximately 35 to 40 pounds over the past 6 to 8 months. He denies any fevers or night sweats or chills. He denies any diarrhea, any blood in his stools. He denies any chest pain or shortness of breath when he is not having these epigastric symptoms. He denies any NSAID use.   PAST MEDICAL HISTORY: Significant for: Hyperlipidemia hyperthyroidism,  Diabetes on insulin pump, hypertension, GERD.   PAST SURGICAL HISTORY: History of cataract surgery, history of right kidney mass removal with complete nephrectomy.    ALLERGIES: None.   MEDICATIONS: He is on pravastatin 40 daily, Protonix 40 mg 1 tablet p.o. b.i.d., Reglan 10 mg 1 tablet at bedtime, methimazole 10 mg 2 tablets b.i.d., lisinopril 10 daily, Carafate 1 g 3 times a day.   SOCIAL HISTORY: does not smoke but chews tobacco. Alcohol, he drinks 1 beer a day. No drug use.   FAMILY HISTORY: Significant for heart disease. Significant for lymphoma in multiple family members.   REVIEW OF SYSTEMS:  CONSTITUTIONAL: Denies any fevers. Complains of fatigue, weakness, weight loss.  EYES: No blurred or double vision. No redness. No inflammation. No glaucoma. Has a history of cataracts.  ENT: No tinnitus, ear pain. No hearing loss. No seasonal or year-round allergies. No epistaxis. No nasal discharge. No snoring. No difficulty swallowing.  RESPIRATORY: Denies any cough, wheezing, hemoptysis. No chronic obstructive pulmonary disease.  CARDIOVASCULAR: Complains of substernal chest pain related to his GI symptoms. No orthopnea. No edema. No arrhythmia. No palpitations. No syncope.  GASTROINTESTINAL: Complains of nausea, vomiting, abdominal pain. No hematemesis. No melena. No history of IBS. No jaundice. No rectal bleeding. No changes in bowel habits.  GENITOURINARY: Denies any dysuria, hematuria, renal colic or frequency.  ENDOCRINE: Denies any polyuria or nocturia. He has hypothyroidism.  HEMATOLOGIC AND LYMPHATIC: Denies anemia, easy bruisability or bleeding.  SKIN: No acne. No rash.  MUSCULOSKELETAL: Denies any pain in the neck, back or shoulder.  NEUROLOGIC: No numbness, CVA, TIA, or seizures.  PSYCHIATRIC: No anxiety, insomnia, or ADD.   PHYSICAL EXAMINATION:  VITAL SIGNS: Temperature 97.9, pulse 65, respirations  16, blood pressure 125/77, O2 100%.  GENERAL: The patient is an obese male, in no acute distress.  HEENT: Head atraumatic, normocephalic. Pupils equally round, reactive to light and accommodation. There is no conjunctival pallor. No scleral  icterus. Extraocular movements intact. Nasal exam shows no drainage or ulceration. External ear exam shows no erythema or drainage.  NECK: Supple, without any JVD.  CARDIOVASCULAR: Regular rate and rhythm. No murmurs, rubs, clicks or gallops. PMI is not displaced.  LUNGS: Clear to auscultation bilaterally, without any rales, rhonchi or wheezing.  No accessory muscle use. ABDOMEN: Soft, nontender. No guarding. No rebound. No hepatosplenomegaly. Positive bowel sounds x4.  EXTREMITIES: No clubbing, cyanosis, or edema.  SKIN: No rash.  LYMPHATICS: No lymph nodes palpable.  VASCULAR: Good DP and PT pulses.  PSYCHIATRIC: Not anxious or depressed.  NEUROLOGIC: Awake, alert, oriented x 3. Cranial nerves II through XII grossly intact. No focal deficits.   LABORATORY, DIAGNOSTIC AND RADIOLOGICAL DATA: Glucose 184, BUN 7, creatinine 1.16, sodium 143, potassium 2.9, chloride 107, CO2 of 29. Lipase 49. LFTs: Total protein 6.5, albumin 2.9, bilirubin total 0.8, alkaline phosphatase 106, AST is 23, ALT is 12, troponin less than 0.02. WBC 9.3, hemoglobin 15, platelet count 189,000. Urinalysis showed 3+ bacteria and leukocytes 2+. CT scan of the abdomen shows cholelithiasis with pericolic cystic haziness as well as mild intrahepatic and extra hepatic biliary duct dilation; fairly extensive retroperitoneal and inguinal lymphadenopathy raises concern for the lymphoproliferative disorder, coronary artery calcifications.   ASSESSMENT AND PLAN: The patient is a 69 year old white male who presents with abdominal pain.  1.    Abdominal pain, possible differentials include possible gallbladder disease. Possible gastric-related symptoms including gastritis, gastric ulcer, duodenitis, duodenal ulcer. At this time, we will go ahead and get a HIDA scan to rule out cholecystitis. Surgery has already seen the patient. We will also get gastroenterology to see the patient for possible endoscopy. Will place him on proton pump  inhibitors b.i.d., continue his Carafate, as taking at home. Also on the differential is lymphoma, with significantly lymph node enlargements.  2.  Significant lymphadenopathy with weight loss. Family history of lymphoma. I will have oncology consult on the patient for further recommendations. The patient will likely need a biopsy of one of the lymph nodes.  3.  Diabetes. We will continue insulin pump and sliding scale. He will be on a clear liquid diet.  4.  Hypothyroidism. Continue methimazole. We will check a TSH.  5.  Hypokalemia. I will replace his potassium and recheck his potassium in the morning.  6.  Gastroesophageal reflux disease. He will be on proton pump inhibitors b.i.d.  7.  Miscellaneous: We will do SCDs for deep vein thrombosis prophylaxis for possible planned procedure in the near future.   TIME SPENT: 60 minutes on this patient.     ____________________________ Lafonda Mosses. Posey Pronto, MD shp:MT D: 06/13/2014 12:28:37 ET T: 06/13/2014 13:00:32 ET JOB#: [id number]  cc: Daphene Chisholm H. Posey Pronto, MD, <Dictator> Alric Seton MD ELECTRONICALLY SIGNED 06/13/2014 15:37

## 2015-02-25 ENCOUNTER — Other Ambulatory Visit: Payer: Self-pay | Admitting: *Deleted

## 2015-02-25 DIAGNOSIS — C8310 Mantle cell lymphoma, unspecified site: Secondary | ICD-10-CM

## 2015-02-26 ENCOUNTER — Other Ambulatory Visit: Payer: Self-pay | Admitting: Internal Medicine

## 2015-02-26 ENCOUNTER — Other Ambulatory Visit: Payer: Self-pay | Admitting: Family Medicine

## 2015-02-26 DIAGNOSIS — F4321 Adjustment disorder with depressed mood: Secondary | ICD-10-CM | POA: Diagnosis not present

## 2015-02-26 DIAGNOSIS — E0500 Thyrotoxicosis with diffuse goiter without thyrotoxic crisis or storm: Secondary | ICD-10-CM | POA: Diagnosis not present

## 2015-02-26 DIAGNOSIS — E10649 Type 1 diabetes mellitus with hypoglycemia without coma: Secondary | ICD-10-CM | POA: Diagnosis not present

## 2015-02-26 DIAGNOSIS — C8313 Mantle cell lymphoma, intra-abdominal lymph nodes: Secondary | ICD-10-CM

## 2015-02-27 ENCOUNTER — Inpatient Hospital Stay (HOSPITAL_BASED_OUTPATIENT_CLINIC_OR_DEPARTMENT_OTHER): Payer: Medicare Other | Admitting: Internal Medicine

## 2015-02-27 ENCOUNTER — Inpatient Hospital Stay: Payer: Medicare Other | Attending: Internal Medicine

## 2015-02-27 ENCOUNTER — Ambulatory Visit
Admission: RE | Admit: 2015-02-27 | Discharge: 2015-02-27 | Disposition: A | Discharge status: Home / Self Care | Payer: Medicare Other | Source: Ambulatory Visit | Attending: Internal Medicine | Admitting: Internal Medicine

## 2015-02-27 ENCOUNTER — Ambulatory Visit: Payer: Medicare Other

## 2015-02-27 ENCOUNTER — Encounter: Payer: Self-pay | Admitting: Internal Medicine

## 2015-02-27 VITALS — BP 161/84 | HR 66 | Temp 98.3°F | Resp 22 | Wt 216.3 lb

## 2015-02-27 DIAGNOSIS — Z79899 Other long term (current) drug therapy: Secondary | ICD-10-CM | POA: Diagnosis not present

## 2015-02-27 DIAGNOSIS — K219 Gastro-esophageal reflux disease without esophagitis: Secondary | ICD-10-CM | POA: Insufficient documentation

## 2015-02-27 DIAGNOSIS — Z794 Long term (current) use of insulin: Secondary | ICD-10-CM

## 2015-02-27 DIAGNOSIS — C8310 Mantle cell lymphoma, unspecified site: Secondary | ICD-10-CM

## 2015-02-27 DIAGNOSIS — I2510 Atherosclerotic heart disease of native coronary artery without angina pectoris: Secondary | ICD-10-CM | POA: Diagnosis not present

## 2015-02-27 DIAGNOSIS — I10 Essential (primary) hypertension: Secondary | ICD-10-CM | POA: Diagnosis not present

## 2015-02-27 DIAGNOSIS — E079 Disorder of thyroid, unspecified: Secondary | ICD-10-CM

## 2015-02-27 DIAGNOSIS — Z7982 Long term (current) use of aspirin: Secondary | ICD-10-CM | POA: Diagnosis not present

## 2015-02-27 DIAGNOSIS — C8315 Mantle cell lymphoma, lymph nodes of inguinal region and lower limb: Secondary | ICD-10-CM | POA: Insufficient documentation

## 2015-02-27 DIAGNOSIS — E119 Type 2 diabetes mellitus without complications: Secondary | ICD-10-CM

## 2015-02-27 DIAGNOSIS — C8313 Mantle cell lymphoma, intra-abdominal lymph nodes: Secondary | ICD-10-CM

## 2015-02-27 DIAGNOSIS — C8298 Follicular lymphoma, unspecified, lymph nodes of multiple sites: Secondary | ICD-10-CM

## 2015-02-27 DIAGNOSIS — E780 Pure hypercholesterolemia: Secondary | ICD-10-CM | POA: Diagnosis not present

## 2015-02-27 LAB — CBC WITH DIFFERENTIAL/PLATELET
BASOS ABS: 0.1 10*3/uL (ref 0–0.1)
BASOS PCT: 1 %
EOS PCT: 2 %
Eosinophils Absolute: 0.1 10*3/uL (ref 0–0.7)
HCT: 41 % (ref 40.0–52.0)
Hemoglobin: 13.6 g/dL (ref 13.0–18.0)
LYMPHS PCT: 20 %
Lymphs Abs: 1.3 10*3/uL (ref 1.0–3.6)
MCH: 27.8 pg (ref 26.0–34.0)
MCHC: 33.1 g/dL (ref 32.0–36.0)
MCV: 84.1 fL (ref 80.0–100.0)
Monocytes Absolute: 0.5 10*3/uL (ref 0.2–1.0)
Monocytes Relative: 9 %
NEUTROS PCT: 68 %
Neutro Abs: 4.4 10*3/uL (ref 1.4–6.5)
PLATELETS: 197 10*3/uL (ref 150–440)
RBC: 4.87 MIL/uL (ref 4.40–5.90)
RDW: 15.3 % — AB (ref 11.5–14.5)
WBC: 6.4 10*3/uL (ref 3.8–10.6)

## 2015-02-27 LAB — CREATININE, SERUM
Creatinine, Ser: 0.8 mg/dL (ref 0.61–1.24)
GFR calc Af Amer: >60 mL/min (ref >60)
GFR calc non Af Amer: >60 mL/min (ref >60)

## 2015-02-27 LAB — LACTATE DEHYDROGENASE: LDH: 156 U/L (ref 98–192)

## 2015-02-27 NOTE — Progress Notes (Signed)
[facility] note   Referred by [name], MD [address] [place], [place] [address]   This 69 y.o. male patient presents to the clinic for f/u lymphoma   Chief Complaint/Problem List: has Chest pain; Thyroid disease; Diabetes; Essential hypertension, benign; Hypercholesterolemia; Gastroparesis; GERD (gastroesophageal reflux disease); Stress; Irregular heart beat; SOB (shortness of breath); History of colonic polyps; Swelling of left lower extremity; Lymphoma; and CAD in native artery on his problem list.   1. Diffuse lymphadenopathy on CT abdo and pelvis, INGUINAL ADENOPATHY ON EXAM, BX PROVEN MANTLE CELL LYMPHOMA FROM LEFT GROIN BX   2. S/P CHOLECYSTECTOMY , 3. Medical hx includes impaired LVF by echo, hyperthyroid on methimazole, diabetes 4. GERD, wt loss 4. FH significant for breast cancer, bilateral, mother young age, also pos for lymphoma HPI: Initially seen in the office so September/11/15, prior to that seen in [facility]. Has had cholecystectomy and inguinal biopsy, to evaluate inguinal and diffuse adenopathy. Found a mantle cell lymphoma which I have been following without treatment. Prior scan was 01/02/15 slight progression. Now patient returns after another CT scan that shows stable disease. Clinically he is stable. No fever chills or sweats O headache or dizziness no chest abdominal pain no other discomfort no increasing adenopathy     Review of Systems:  General: No acute distress, , fever chills or sweats , he has chronic swelling in the left leg below the knee, his mood and affect is unremarkable no anxiety depression, currently no chest pain no abdominal pain no bone pain                        Allergies No Known Allergies  Significant History/PMH: Past Medical History  Diagnosis Date  . Peripheral neuropathy   . Hypertension   . Diabetes mellitus due to underlying condition with diabetic retinopathy with  macular edema   . Hypercholesterolemia   . Gastroparesis   . Graves disease   . Depression   . Mantle cell lymphoma    Past Surgical History  Procedure Laterality Date  . Nephrectomy      right            Smoking History:  prior smoker quit 45 years ago  [place]: Family History:  Family History  Problem Relation Age of Onset  . Breast cancer Mother   . Diabetes Father   . Cancer Father     Lymphoma  . Alcohol abuse Maternal Uncle   . Diabetes Sister   . Heart disease      grandfather   mothers of breast cancers  bilateral and she was in her 62s   Comments:   Social History:  History  Alcohol Use  . 0.0 oz/week  . 0 Standard drinks or equivalent per week    Additional Past Medical and Surgical History: Colonoscopy 03/2014    Home Medications: Prior to Admission medications   Medication Sig Start Date End Date Taking? Authorizing Provider  aspirin EC 325 MG tablet Take by mouth.   Yes Historical Provider, MD  FLUoxetine (PROZAC) 20 MG capsule Take 20 mg by mouth daily. 09/05/14   Historical Provider, MD  fluticasone (FLOVENT HFA) 110 MCG/ACT inhaler Inhale 2 puffs into the lungs 2 (two) times daily. 04/24/14   [name], MD  insulin aspart (NOVOLOG) 100 UNIT/ML injection Use as directed 04/12/13   [name], MD  lisinopril (PRINIVIL,ZESTRIL) 20 MG tablet TAKE 1 TABLET (20 MG TOTAL) BY  MOUTH AT BEDTIME. 06/29/14   [name], MD  methimazole (TAPAZOLE) 10 MG tablet Take 20 mg by mouth 2 (two) times daily.    Historical Provider, MD  metoCLOPramide (REGLAN) 10 MG tablet TAKE 1 TABLET (10 MG TOTAL) BY MOUTH AT BEDTIME. 12/25/13   [name], MD  pantoprazole (PROTONIX) 40 MG tablet TAKE 1 TABLET (40 MG TOTAL) BY MOUTH DAILY. 12/25/13   [name], MD  pravastatin (PRAVACHOL) 40 MG tablet TAKE 1 TABLET BY MOUTH AT BEDTIME 01/09/15   [name], MD  sucralfate (CARAFATE) 1 G tablet TAKE 1 TABLET BY MOUTH 4 TIMES A DAY BEFORE MEALS AND AT BEDTIME FOR 30 DAYS  10/13/13   [name], MD    Vital Signs:  Blood pressure 161/84, pulse 66, temperature 98.3 F (36.8 C), temperature source Tympanic, resp. rate 22, weight 216 lb 4.3 oz (98.1 kg).  Physical Exam:  General: well developed, well nourished, and no acute distress  Mental Status: alert and oriented to person, place and time  Head, Ears, Nose,Throat: No thrush  Respiratory: no rales, rhonchi, or wheezing, no dullness  Cardiovascular: regular rate and rhythm  Gastrointestinal: soft, non tender, no masses or organomegaly  Musculoskeletal: Left leg below the knee diameter greater than right no changes     Neurological: No gross focal weakness cranial nerves intact  Lymphatics: Not palpable, neck supraclavicular, submandibular, axilla, and left groin is residual palpable mass which is residual nodes and/or seroma is prior noted    Psych: Mood, Affect, Unremarkable    Laboratory Results: Appointment on 02/27/2015  Component Date Value Ref Range Status  . WBC 02/27/2015 6.4  3.8 - 10.6 K/uL Final  . RBC 02/27/2015 4.87  4.40 - 5.90 MIL/uL Final  . Hemoglobin 02/27/2015 13.6  13.0 - 18.0 g/dL Final  . HCT 02/27/2015 41.0  40.0 - 52.0 % Final  . MCV 02/27/2015 84.1  80.0 - 100.0 fL Final  . MCH 02/27/2015 27.8  26.0 - 34.0 pg Final  . MCHC 02/27/2015 33.1  32.0 - 36.0 g/dL Final  . RDW 02/27/2015 15.3* 11.5 - 14.5 % Final  . Platelets 02/27/2015 197  150 - 440 K/uL Final  . Neutrophils Relative % 02/27/2015 68   Final  . Neutro Abs 02/27/2015 4.4  1.4 - 6.5 K/uL Final  . Lymphocytes Relative 02/27/2015 20   Final  . Lymphs Abs 02/27/2015 1.3  1.0 - 3.6 K/uL Final  . Monocytes Relative 02/27/2015 9   Final  . Monocytes Absolute 02/27/2015 0.5  0.2 - 1.0 K/uL Final  . Eosinophils Relative 02/27/2015 2   Final  . Eosinophils Absolute 02/27/2015 0.1  0 - 0.7 K/uL Final  . Basophils Relative 02/27/2015 1   Final  . Basophils Absolute 02/27/2015 0.1  0 - 0.1 K/uL Final  . Creatinine, Ser  02/27/2015 0.80  0.61 - 1.24 mg/dL Final  . GFR calc non Af Amer 02/27/2015 >60  >60 mL/min Final  . GFR calc Af Amer 02/27/2015 >60  >60 mL/min Final   Comment: (NOTE) The eGFR has been calculated using the CKD EPI equation. This calculation has not been validated in all clinical situations. eGFR's persistently <60 mL/min signify possible Chronic Kidney Disease.   Marland Kitchen LDH 02/27/2015 156  98 - 192 U/L Final          Radiology Results: Ct Abdomen Pelvis Wo Contrast  02/27/2015   CLINICAL DATA:  Subsequent encounter for mantle cell lymphoma  EXAM: CT CHEST, ABDOMEN AND PELVIS WITHOUT  CONTRAST  TECHNIQUE: Multidetector CT imaging of the chest, abdomen and pelvis was performed following the standard protocol without IV contrast.  COMPARISON:  01/02/2015.  FINDINGS: CT CHEST FINDINGS  Mediastinum/Nodes: Scattered small axillary and mediastinal lymph nodes have decreased slightly in the interval. Index right paratracheal lymph node which was 9 mm in short axis previously is now 7 mm (image 25 series 2). Subcarinal lymph node which was previously measured at 12 mm short axis is now 9 mm (image 32 series 2). Other axillary and mediastinal lymph nodes remain small in shows similar slight decrease in size.  Heart size is normal. Coronary artery calcification is noted. No pericardial effusion.  Lungs/Pleura: Changes paraseptal emphysema are noted bilaterally. No substantial pleural effusion. No edema or focal airspace consolidation. Trace scarring at the right lung base is stable.  Musculoskeletal: Bone windows reveal no worrisome lytic or sclerotic osseous lesions.  CT ABDOMEN AND PELVIS FINDINGS  Hepatobiliary: Liver has normal uninfused features. Gallbladder surgically absent. No intrahepatic or extrahepatic biliary dilation.  Pancreas: No focal mass lesion. No dilatation of the main duct. No intraparenchymal cyst. No peripancreatic edema.  Spleen: No splenomegaly. No focal mass lesion.  Adrenals/Urinary  Tract: No adrenal nodule or mass. Left kidney unremarkable. Right kidney is surgically absent. No left hydroureter. Urinary bladder is decompressed which may account for the apparent circumferential wall thickening.  Stomach/Bowel: Stomach is nondistended. No gastric wall thickening. No evidence of outlet obstruction. Duodenum is normally positioned as is the ligament of Treitz. No small bowel wall thickening. No small bowel dilatation. Terminal ileum normal. Appendix is normal. No gross colonic mass. No colonic wall thickening. No substantial diverticular change.  Vascular/Lymphatic: There is abdominal aortic atherosclerosis without aneurysm. Retro aortic lymph node measured previously 11 mm short axis is in a 10 mm (image 76 series 2). No other Lymphadenopathy in the abdomen. 1.7 cm short axis right inguinal lymph node is unchanged. Small left inguinal lymph nodes are stable. Cystic structure in the subcutaneous tissues of the left groin is unchanged.  Reproductive: Prostate gland and seminal vesicles are unremarkable.  Other: No intraperitoneal free fluid.  Musculoskeletal: Left inguinal hernia contains only fat. Bone windows reveal no worrisome lytic or sclerotic osseous lesions.  IMPRESSION: No substantial change in index lymph nodes of the chest, abdomen and pelvis. No new or progressive lymphadenopathy.  Stable appearance of cystic structure in the left groin area, likely related to seroma or hematoma.   Electronically Signed   By: Misty Stanley M.D.   On: 02/27/2015 13:53   Ct Chest Wo Contrast  02/27/2015   CLINICAL DATA:  Subsequent encounter for mantle cell lymphoma  EXAM: CT CHEST, ABDOMEN AND PELVIS WITHOUT CONTRAST  TECHNIQUE: Multidetector CT imaging of the chest, abdomen and pelvis was performed following the standard protocol without IV contrast.  COMPARISON:  01/02/2015.  FINDINGS: CT CHEST FINDINGS  Mediastinum/Nodes: Scattered small axillary and mediastinal lymph nodes have decreased slightly  in the interval. Index right paratracheal lymph node which was 9 mm in short axis previously is now 7 mm (image 25 series 2). Subcarinal lymph node which was previously measured at 12 mm short axis is now 9 mm (image 32 series 2). Other axillary and mediastinal lymph nodes remain small in shows similar slight decrease in size.  Heart size is normal. Coronary artery calcification is noted. No pericardial effusion.  Lungs/Pleura: Changes paraseptal emphysema are noted bilaterally. No substantial pleural effusion. No edema or focal airspace consolidation. Trace scarring at the right lung  base is stable.  Musculoskeletal: Bone windows reveal no worrisome lytic or sclerotic osseous lesions.  CT ABDOMEN AND PELVIS FINDINGS  Hepatobiliary: Liver has normal uninfused features. Gallbladder surgically absent. No intrahepatic or extrahepatic biliary dilation.  Pancreas: No focal mass lesion. No dilatation of the main duct. No intraparenchymal cyst. No peripancreatic edema.  Spleen: No splenomegaly. No focal mass lesion.  Adrenals/Urinary Tract: No adrenal nodule or mass. Left kidney unremarkable. Right kidney is surgically absent. No left hydroureter. Urinary bladder is decompressed which may account for the apparent circumferential wall thickening.  Stomach/Bowel: Stomach is nondistended. No gastric wall thickening. No evidence of outlet obstruction. Duodenum is normally positioned as is the ligament of Treitz. No small bowel wall thickening. No small bowel dilatation. Terminal ileum normal. Appendix is normal. No gross colonic mass. No colonic wall thickening. No substantial diverticular change.  Vascular/Lymphatic: There is abdominal aortic atherosclerosis without aneurysm. Retro aortic lymph node measured previously 11 mm short axis is in a 10 mm (image 76 series 2). No other Lymphadenopathy in the abdomen. 1.7 cm short axis right inguinal lymph node is unchanged. Small left inguinal lymph nodes are stable. Cystic  structure in the subcutaneous tissues of the left groin is unchanged.  Reproductive: Prostate gland and seminal vesicles are unremarkable.  Other: No intraperitoneal free fluid.  Musculoskeletal: Left inguinal hernia contains only fat. Bone windows reveal no worrisome lytic or sclerotic osseous lesions.  IMPRESSION: No substantial change in index lymph nodes of the chest, abdomen and pelvis. No new or progressive lymphadenopathy.  Stable appearance of cystic structure in the left groin area, likely related to seroma or hematoma.   Electronically Signed   By: Misty Stanley M.D.   On: 02/27/2015 13:53           Assessment and Plan: Impression: SEE ALSO PROBLEM LIST...MANTLE CELL LYMPHOMA, PET IN 06/2014,   NORMAL CBC, LDH, SIEP, AND NEG HIV IN 06/2014. FLOW CYTOMETRY SHOWED ABNORMAL BUT NON SPECIFIC MONOCYTIC/CD 56 EXPRESSION.  MEDICAL HX INCLUDES ABNORMAL ECHO WITH EF 40%, NO CLINICAL CHF, ALSO HYPERTHYROID ON METHIMAZOLE, AND DM WITH INSULIN PUMP. SIGNIFICANT POS FH BREAST CANCER.   Today clinically stable, labs stable, CT has just done reported above is stable    Plan: , AS ANTHRACYCLINES MAY BE CONTRAINDICATED. POOR CANDIDATE FOR HIDAC TX OR AUTOLOGOUS TRANSPLANT, MOST APPRPRIATE TX  WOULD BE RITUXAN TREANDA, AT APPROPRIATE TIME. NO NEED FOR BM AS WILL NOT CHANGE TX. CURRENTLY WITH MINIMAL SYMPTOMS, NORMAL LDH, IMPROVING AFTER SURGERY, TX NOT INDICATED.Marland KitchenWILL WATCH FOR APPROPRIATE INDICATION FOR EARLY VS [place]     now advised that we'll go ahead next month with my risk testing  . MAY NEED REPEAT ECHO.  NOW, REPEAT CT SCAN IN 12 WEEKS  TO FOLLOW LYMPHOMA,

## 2015-03-07 DIAGNOSIS — E10649 Type 1 diabetes mellitus with hypoglycemia without coma: Secondary | ICD-10-CM | POA: Diagnosis not present

## 2015-03-07 DIAGNOSIS — E785 Hyperlipidemia, unspecified: Secondary | ICD-10-CM | POA: Diagnosis not present

## 2015-03-07 DIAGNOSIS — E0500 Thyrotoxicosis with diffuse goiter without thyrotoxic crisis or storm: Secondary | ICD-10-CM | POA: Diagnosis not present

## 2015-03-07 DIAGNOSIS — Z4681 Encounter for fitting and adjustment of insulin pump: Secondary | ICD-10-CM | POA: Diagnosis not present

## 2015-03-22 ENCOUNTER — Other Ambulatory Visit: Payer: Self-pay | Admitting: Internal Medicine

## 2015-04-03 ENCOUNTER — Inpatient Hospital Stay: Payer: Medicare Other | Attending: Family Medicine

## 2015-04-03 DIAGNOSIS — C8315 Mantle cell lymphoma, lymph nodes of inguinal region and lower limb: Secondary | ICD-10-CM | POA: Diagnosis not present

## 2015-04-03 DIAGNOSIS — C8313 Mantle cell lymphoma, intra-abdominal lymph nodes: Secondary | ICD-10-CM

## 2015-04-03 LAB — CBC WITH DIFFERENTIAL/PLATELET
Basophils Absolute: 0 10*3/uL (ref 0–0.1)
Basophils Relative: 1 %
Eosinophils Absolute: 0.1 10*3/uL (ref 0–0.7)
Eosinophils Relative: 2 %
HEMATOCRIT: 42.3 % (ref 40.0–52.0)
HEMOGLOBIN: 13.6 g/dL (ref 13.0–18.0)
LYMPHS ABS: 1.1 10*3/uL (ref 1.0–3.6)
Lymphocytes Relative: 17 %
MCH: 27.2 pg (ref 26.0–34.0)
MCHC: 32.1 g/dL (ref 32.0–36.0)
MCV: 84.6 fL (ref 80.0–100.0)
MONO ABS: 0.5 10*3/uL (ref 0.2–1.0)
Monocytes Relative: 7 %
Neutro Abs: 4.9 10*3/uL (ref 1.4–6.5)
Neutrophils Relative %: 73 %
Platelets: 164 10*3/uL (ref 150–440)
RBC: 5 MIL/uL (ref 4.40–5.90)
RDW: 14.5 % (ref 11.5–14.5)
WBC: 6.7 10*3/uL (ref 3.8–10.6)

## 2015-04-03 LAB — CREATININE, SERUM
Creatinine, Ser: 0.86 mg/dL (ref 0.61–1.24)
GFR calc Af Amer: >60 mL/min (ref >60)
GFR calc non Af Amer: >60 mL/min (ref >60)

## 2015-04-03 LAB — LACTATE DEHYDROGENASE: LDH: 157 U/L (ref 98–192)

## 2015-04-08 ENCOUNTER — Other Ambulatory Visit (INDEPENDENT_AMBULATORY_CARE_PROVIDER_SITE_OTHER): Payer: Medicare Other

## 2015-04-08 DIAGNOSIS — E08311 Diabetes mellitus due to underlying condition with unspecified diabetic retinopathy with macular edema: Secondary | ICD-10-CM

## 2015-04-08 DIAGNOSIS — I10 Essential (primary) hypertension: Secondary | ICD-10-CM | POA: Diagnosis not present

## 2015-04-08 DIAGNOSIS — E1151 Type 2 diabetes mellitus with diabetic peripheral angiopathy without gangrene: Secondary | ICD-10-CM | POA: Diagnosis not present

## 2015-04-08 DIAGNOSIS — E7800 Pure hypercholesterolemia, unspecified: Secondary | ICD-10-CM

## 2015-04-08 LAB — HEPATIC FUNCTION PANEL
ALT: 10 U/L (ref 0–53)
AST: 18 U/L (ref 0–37)
Albumin: 3.3 g/dL — ABNORMAL LOW (ref 3.5–5.2)
Alkaline Phosphatase: 105 U/L (ref 39–117)
BILIRUBIN TOTAL: 0.8 mg/dL (ref 0.2–1.2)
Bilirubin, Direct: 0.1 mg/dL (ref 0.0–0.3)
Total Protein: 6.6 g/dL (ref 6.0–8.3)

## 2015-04-08 LAB — HEMOGLOBIN A1C: Hgb A1c MFr Bld: 6.5 % (ref 4.6–6.5)

## 2015-04-08 LAB — BASIC METABOLIC PANEL
BUN: 8 mg/dL (ref 6–23)
CALCIUM: 8.2 mg/dL — AB (ref 8.4–10.5)
CHLORIDE: 103 meq/L (ref 96–112)
CO2: 30 mEq/L (ref 19–32)
Creatinine, Ser: 0.9 mg/dL (ref 0.40–1.50)
GFR: 89.53 mL/min (ref >60.00)
Glucose, Bld: 194 mg/dL — ABNORMAL HIGH (ref 70–99)
Potassium: 3.5 mEq/L (ref 3.5–5.1)
Sodium: 139 mEq/L (ref 135–145)

## 2015-04-08 LAB — MICROALBUMIN / CREATININE URINE RATIO
Creatinine,U: 151.6 mg/dL
Microalb Creat Ratio: 0.9 mg/g (ref 0.0–30.0)
Microalb, Ur: 1.4 mg/dL (ref 0.0–1.9)

## 2015-04-08 LAB — LIPID PANEL
Cholesterol: 141 mg/dL (ref 0–200)
HDL: 58.4 mg/dL (ref >39.00)
LDL Cholesterol: 72 mg/dL (ref 0–99)
NONHDL: 82.6
TRIGLYCERIDES: 54 mg/dL (ref 0.0–149.0)
Total CHOL/HDL Ratio: 2
VLDL: 10.8 mg/dL (ref 0.0–40.0)

## 2015-04-09 ENCOUNTER — Encounter: Payer: Self-pay | Admitting: Internal Medicine

## 2015-04-09 ENCOUNTER — Ambulatory Visit (INDEPENDENT_AMBULATORY_CARE_PROVIDER_SITE_OTHER): Payer: Medicare Other | Admitting: Internal Medicine

## 2015-04-09 VITALS — BP 130/70 | HR 74 | Temp 98.1°F | Ht 70.0 in | Wt 221.1 lb

## 2015-04-09 DIAGNOSIS — E1151 Type 2 diabetes mellitus with diabetic peripheral angiopathy without gangrene: Secondary | ICD-10-CM | POA: Diagnosis not present

## 2015-04-09 DIAGNOSIS — Z658 Other specified problems related to psychosocial circumstances: Secondary | ICD-10-CM

## 2015-04-09 DIAGNOSIS — F439 Reaction to severe stress, unspecified: Secondary | ICD-10-CM

## 2015-04-09 DIAGNOSIS — I2510 Atherosclerotic heart disease of native coronary artery without angina pectoris: Secondary | ICD-10-CM | POA: Diagnosis not present

## 2015-04-09 DIAGNOSIS — K219 Gastro-esophageal reflux disease without esophagitis: Secondary | ICD-10-CM | POA: Diagnosis not present

## 2015-04-09 DIAGNOSIS — Z86010 Personal history of colonic polyps: Secondary | ICD-10-CM

## 2015-04-09 DIAGNOSIS — C8590 Non-Hodgkin lymphoma, unspecified, unspecified site: Secondary | ICD-10-CM

## 2015-04-09 DIAGNOSIS — I10 Essential (primary) hypertension: Secondary | ICD-10-CM | POA: Diagnosis not present

## 2015-04-09 DIAGNOSIS — E7800 Pure hypercholesterolemia, unspecified: Secondary | ICD-10-CM

## 2015-04-09 DIAGNOSIS — E079 Disorder of thyroid, unspecified: Secondary | ICD-10-CM | POA: Diagnosis not present

## 2015-04-09 NOTE — Progress Notes (Signed)
Pre visit review using our clinic review tool, if applicable. No additional management support is needed unless otherwise documented below in the visit note. 

## 2015-04-09 NOTE — Progress Notes (Signed)
[name], male   DOB: 06-07-1946, 69 y.o.   MRN: [id number]   Subjective:    [name], male    DOB: July 29, 1946, 69 y.o.   MRN: [id number]  HPI  Patient here for a scheduled follow up.  Has been seeing Dr Cynda Acres for mantle cell lymphoma.  Had recent CT.  Unchanged per report.  Sugars are doing better.  Seeing Dr Eddie Dibbles.  Insulin pump.  Since had gallbladder removed, GI symptoms have not been an issue.  Due to see Dr Tiffany Kocher this pm.  Will discuss medication with him.  No cardiac symptoms with increased activity or exertion.  Breathing stable.  Decreased stress.  Sleeping and eating better.  Son not at home.  Seeing Dr Nicolasa Ducking.  Doing better on prozac and trazodone.    Past Medical History  Diagnosis Date  . Peripheral neuropathy   . Hypertension   . 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 Hypercholesterolemia   . Gastroparesis   . Graves disease   . Depression   . Mantle cell lymphoma     Current Outpatient Prescriptions on File Prior to Visit  Medication Sig Dispense Refill  . aspirin EC 325 MG tablet Take by mouth.    . fluticasone (FLOVENT HFA) 110 MCG/ACT inhaler Inhale 2 puffs into the lungs 2 (two) times daily. 3 Inhaler 1  . insulin aspart (NOVOLOG) 100 UNIT/ML injection Use as directed 1 vial 12  . lisinopril (PRINIVIL,ZESTRIL) 20 MG tablet TAKE 1 TABLET BY MOUTH AT BEDTIME 90 tablet 1  . methimazole (TAPAZOLE) 10 MG tablet Take 20 mg by mouth 2 (two) times daily.    . metoCLOPramide (REGLAN) 10 MG tablet TAKE 1 TABLET (10 MG TOTAL) BY MOUTH AT BEDTIME. 90 tablet 1  . pantoprazole (PROTONIX) 40 MG tablet TAKE 1 TABLET (40 MG TOTAL) BY MOUTH DAILY. 90 tablet 1  . pravastatin (PRAVACHOL) 40 MG tablet TAKE 1 TABLET BY MOUTH AT BEDTIME 90 tablet 1  . sucralfate (CARAFATE) 1 G tablet TAKE 1 TABLET BY MOUTH 4 TIMES A DAY BEFORE MEALS AND AT BEDTIME FOR 30 DAYS 120 tablet 0   No current  facility-administered medications on file prior to visit.    Review of Systems  Constitutional: Negative for appetite change (eating better. ) and unexpected weight change.  HENT: Negative for congestion and sinus pressure.   Respiratory: Negative for cough, chest tightness and shortness of breath.   Cardiovascular: Negative for chest pain, palpitations and leg swelling (improved. ).  Gastrointestinal: Negative for nausea, vomiting, abdominal pain and diarrhea.  Musculoskeletal: Negative for back pain and joint swelling.  Skin: Negative for color change and rash.  Neurological: Negative for dizziness, light-headedness and headaches.  Psychiatric/Behavioral: Negative for dysphoric mood (mood overall is better. ) and agitation.       Objective:     Blood pressure recheck:  138/78, pulse 76  Physical Exam  Constitutional: He appears well-developed and well-nourished. No distress.  HENT:  Nose: Nose normal.  Mouth/Throat: Oropharynx is clear and moist.  Neck: Neck supple. No thyromegaly present.  Cardiovascular: Normal rate and regular rhythm.   Pulmonary/Chest: Effort normal and breath sounds normal. No respiratory distress.  Abdominal: Soft. Bowel sounds are normal. There is no tenderness.  Musculoskeletal: He exhibits no edema or tenderness.  Lymphadenopathy:    He has no cervical adenopathy.  Skin: No rash noted. No erythema.  Psychiatric: He has a  normal mood and affect. His behavior is normal.    BP 130/70 mmHg  Pulse 74  Temp(Src) 98.1 F (36.7 C) (Oral)  Ht 5' 10"  (1.778 m)  Wt 221 lb 2 oz (100.302 kg)  BMI 31.73 kg/m2  SpO2 98% Wt Readings from Last 3 Encounters:  04/09/15 221 lb 2 oz (100.302 kg)  02/27/15 216 lb 4.3 oz (98.1 kg)  12/26/14 212 lb 8 oz (96.389 kg)     Lab Results  Component Value Date   WBC 6.7 04/03/2015   HGB 13.6 04/03/2015   HCT 42.3 04/03/2015   PLT 164 04/03/2015   GLUCOSE 194* 04/08/2015   CHOL 141 04/08/2015   TRIG 54.0  04/08/2015   HDL 58.40 04/08/2015   LDLCALC 72 04/08/2015   ALT 10 04/08/2015   AST 18 04/08/2015   NA 139 04/08/2015   K 3.5 04/08/2015   CL 103 04/08/2015   CREATININE 0.90 04/08/2015   BUN 8 04/08/2015   CO2 30 04/08/2015   INR 1.3 07/12/2014   HGBA1C 6.5 04/08/2015   MICROALBUR 1.4 04/08/2015    Ct Abdomen Pelvis Wo Contrast  02/27/2015   CLINICAL DATA:  Subsequent encounter for mantle cell lymphoma  EXAM: CT CHEST, ABDOMEN AND PELVIS WITHOUT CONTRAST  TECHNIQUE: Multidetector CT imaging of the chest, abdomen and pelvis was performed following the standard protocol without IV contrast.  COMPARISON:  01/02/2015.  FINDINGS: CT CHEST FINDINGS  Mediastinum/Nodes: Scattered small axillary and mediastinal lymph nodes have decreased slightly in the interval. Index right paratracheal lymph node which was 9 mm in short axis previously is now 7 mm (image 25 series 2). Subcarinal lymph node which was previously measured at 12 mm short axis is now 9 mm (image 32 series 2). Other axillary and mediastinal lymph nodes remain small in shows similar slight decrease in size.  Heart size is normal. Coronary artery calcification is noted. No pericardial effusion.  Lungs/Pleura: Changes paraseptal emphysema are noted bilaterally. No substantial pleural effusion. No edema or focal airspace consolidation. Trace scarring at the right lung base is stable.  Musculoskeletal: Bone windows reveal no worrisome lytic or sclerotic osseous lesions.  CT ABDOMEN AND PELVIS FINDINGS  Hepatobiliary: Liver has normal uninfused features. Gallbladder surgically absent. No intrahepatic or extrahepatic biliary dilation.  Pancreas: No focal mass lesion. No dilatation of the main duct. No intraparenchymal cyst. No peripancreatic edema.  Spleen: No splenomegaly. No focal mass lesion.  Adrenals/Urinary Tract: No adrenal nodule or mass. Left kidney unremarkable. Right kidney is surgically absent. No left hydroureter. Urinary bladder is  decompressed which may account for the apparent circumferential wall thickening.  Stomach/Bowel: Stomach is nondistended. No gastric wall thickening. No evidence of outlet obstruction. Duodenum is normally positioned as is the ligament of Treitz. No small bowel wall thickening. No small bowel dilatation. Terminal ileum normal. Appendix is normal. No gross colonic mass. No colonic wall thickening. No substantial diverticular change.  Vascular/Lymphatic: There is abdominal aortic atherosclerosis without aneurysm. Retro aortic lymph node measured previously 11 mm short axis is in a 10 mm (image 76 series 2). No other Lymphadenopathy in the abdomen. 1.7 cm short axis right inguinal lymph node is unchanged. Small left inguinal lymph nodes are stable. Cystic structure in the subcutaneous tissues of the left groin is unchanged.  Reproductive: Prostate gland and seminal vesicles are unremarkable.  Other: No intraperitoneal free fluid.  Musculoskeletal: Left inguinal hernia contains only fat. Bone windows reveal no worrisome lytic or sclerotic osseous lesions.  IMPRESSION: No substantial  change in index lymph nodes of the chest, abdomen and pelvis. No new or progressive lymphadenopathy.  Stable appearance of cystic structure in the left groin area, likely related to seroma or hematoma.   Electronically Signed   By: Misty Stanley M.D.   On: 02/27/2015 13:53   Ct Chest Wo Contrast  02/27/2015   CLINICAL DATA:  Subsequent encounter for mantle cell lymphoma  EXAM: CT CHEST, ABDOMEN AND PELVIS WITHOUT CONTRAST  TECHNIQUE: Multidetector CT imaging of the chest, abdomen and pelvis was performed following the standard protocol without IV contrast.  COMPARISON:  01/02/2015.  FINDINGS: CT CHEST FINDINGS  Mediastinum/Nodes: Scattered small axillary and mediastinal lymph nodes have decreased slightly in the interval. Index right paratracheal lymph node which was 9 mm in short axis previously is now 7 mm (image 25 series 2).  Subcarinal lymph node which was previously measured at 12 mm short axis is now 9 mm (image 32 series 2). Other axillary and mediastinal lymph nodes remain small in shows similar slight decrease in size.  Heart size is normal. Coronary artery calcification is noted. No pericardial effusion.  Lungs/Pleura: Changes paraseptal emphysema are noted bilaterally. No substantial pleural effusion. No edema or focal airspace consolidation. Trace scarring at the right lung base is stable.  Musculoskeletal: Bone windows reveal no worrisome lytic or sclerotic osseous lesions.  CT ABDOMEN AND PELVIS FINDINGS  Hepatobiliary: Liver has normal uninfused features. Gallbladder surgically absent. No intrahepatic or extrahepatic biliary dilation.  Pancreas: No focal mass lesion. No dilatation of the main duct. No intraparenchymal cyst. No peripancreatic edema.  Spleen: No splenomegaly. No focal mass lesion.  Adrenals/Urinary Tract: No adrenal nodule or mass. Left kidney unremarkable. Right kidney is surgically absent. No left hydroureter. Urinary bladder is decompressed which may account for the apparent circumferential wall thickening.  Stomach/Bowel: Stomach is nondistended. No gastric wall thickening. No evidence of outlet obstruction. Duodenum is normally positioned as is the ligament of Treitz. No small bowel wall thickening. No small bowel dilatation. Terminal ileum normal. Appendix is normal. No gross colonic mass. No colonic wall thickening. No substantial diverticular change.  Vascular/Lymphatic: There is abdominal aortic atherosclerosis without aneurysm. Retro aortic lymph node measured previously 11 mm short axis is in a 10 mm (image 76 series 2). No other Lymphadenopathy in the abdomen. 1.7 cm short axis right inguinal lymph node is unchanged. Small left inguinal lymph nodes are stable. Cystic structure in the subcutaneous tissues of the left groin is unchanged.  Reproductive: Prostate gland and seminal vesicles are  unremarkable.  Other: No intraperitoneal free fluid.  Musculoskeletal: Left inguinal hernia contains only fat. Bone windows reveal no worrisome lytic or sclerotic osseous lesions.  IMPRESSION: No substantial change in index lymph nodes of the chest, abdomen and pelvis. No new or progressive lymphadenopathy.  Stable appearance of cystic structure in the left groin area, likely related to seroma or hematoma.   Electronically Signed   By: Misty Stanley M.D.   On: 02/27/2015 13:53       Assessment & Plan:   Problem List Items Addressed This Visit    Diabetes    Followed by Dr Eddie Dibbles.  Insulin pump.  Follow met b and a1c.  Sees Dr Thomasene Ripple for eye exams.        Essential hypertension, benign - Primary    Blood pressure doing well.  Same medication regimen.  Follow pressures.  Follow metabolic panel.      GERD (gastroesophageal reflux disease)    EGD 01/08/14 as  outlined.  No symptoms.  Due to see Dr Tiffany Kocher this pm.        History of colonic polyps    Colonoscopy 01/08/14 as outlined.  Recommended f/u colonoscopy 12/2018.       Hypercholesterolemia    Low cholesterol diet and exercise.  On pravastatin.  Recent cholesterol ok.   Lab Results  Component Value Date   CHOL 141 04/08/2015   HDL 58.40 04/08/2015   LDLCALC 72 04/08/2015   TRIG 54.0 04/08/2015   CHOLHDL 2 04/08/2015        Lymphoma    Diagnosed with mantle cell lymphoma.  Followed by oncology.       Stress    Doing better.  Followed by Dr Nicolasa Ducking.  On trazodone and prozac.       Thyroid disease    Followed by Dr Eddie Dibbles.           [name], MD

## 2015-04-10 ENCOUNTER — Encounter: Payer: Self-pay | Admitting: Internal Medicine

## 2015-04-10 NOTE — Assessment & Plan Note (Signed)
Blood pressure doing well.  Same medication regimen.  Follow pressures.  Follow metabolic panel.   

## 2015-04-10 NOTE — Assessment & Plan Note (Signed)
EGD 01/08/14 as outlined.  No symptoms.  Due to see Dr Tiffany Kocher this pm.

## 2015-04-10 NOTE — Assessment & Plan Note (Signed)
Followed by Dr Paul.  

## 2015-04-10 NOTE — Assessment & Plan Note (Signed)
Colonoscopy 01/08/14 as outlined.  Recommended f/u colonoscopy 12/2018.

## 2015-04-10 NOTE — Assessment & Plan Note (Addendum)
Followed by Dr Eddie Dibbles.  Insulin pump.  Follow met b and a1c.  Sees Dr Thomasene Ripple for eye exams.

## 2015-04-10 NOTE — Assessment & Plan Note (Signed)
Doing better.  Followed by Dr Nicolasa Ducking.  On trazodone and prozac.

## 2015-04-10 NOTE — Assessment & Plan Note (Signed)
Diagnosed with mantle cell lymphoma.  Followed by oncology.

## 2015-04-10 NOTE — Assessment & Plan Note (Signed)
Low cholesterol diet and exercise.  On pravastatin.  Recent cholesterol ok.   Lab Results  Component Value Date   CHOL 141 04/08/2015   HDL 58.40 04/08/2015   LDLCALC 72 04/08/2015   TRIG 54.0 04/08/2015   CHOLHDL 2 04/08/2015

## 2015-04-22 DIAGNOSIS — I429 Cardiomyopathy, unspecified: Secondary | ICD-10-CM | POA: Insufficient documentation

## 2015-04-22 DIAGNOSIS — I428 Other cardiomyopathies: Secondary | ICD-10-CM | POA: Diagnosis not present

## 2015-04-22 DIAGNOSIS — E782 Mixed hyperlipidemia: Secondary | ICD-10-CM | POA: Diagnosis not present

## 2015-04-22 DIAGNOSIS — I2510 Atherosclerotic heart disease of native coronary artery without angina pectoris: Secondary | ICD-10-CM | POA: Diagnosis not present

## 2015-04-22 DIAGNOSIS — I493 Ventricular premature depolarization: Secondary | ICD-10-CM | POA: Diagnosis not present

## 2015-05-08 ENCOUNTER — Inpatient Hospital Stay: Payer: Medicare Other | Attending: Oncology

## 2015-05-08 ENCOUNTER — Inpatient Hospital Stay (HOSPITAL_BASED_OUTPATIENT_CLINIC_OR_DEPARTMENT_OTHER): Payer: Medicare Other | Admitting: Oncology

## 2015-05-08 VITALS — BP 146/74 | HR 89 | Temp 96.2°F | Resp 18 | Wt 220.0 lb

## 2015-05-08 DIAGNOSIS — Z79899 Other long term (current) drug therapy: Secondary | ICD-10-CM | POA: Diagnosis not present

## 2015-05-08 DIAGNOSIS — Z7982 Long term (current) use of aspirin: Secondary | ICD-10-CM | POA: Diagnosis not present

## 2015-05-08 DIAGNOSIS — F329 Major depressive disorder, single episode, unspecified: Secondary | ICD-10-CM | POA: Insufficient documentation

## 2015-05-08 DIAGNOSIS — E0500 Thyrotoxicosis with diffuse goiter without thyrotoxic crisis or storm: Secondary | ICD-10-CM | POA: Insufficient documentation

## 2015-05-08 DIAGNOSIS — E11311 Type 2 diabetes mellitus with unspecified diabetic retinopathy with macular edema: Secondary | ICD-10-CM | POA: Insufficient documentation

## 2015-05-08 DIAGNOSIS — Z794 Long term (current) use of insulin: Secondary | ICD-10-CM

## 2015-05-08 DIAGNOSIS — E780 Pure hypercholesterolemia: Secondary | ICD-10-CM | POA: Insufficient documentation

## 2015-05-08 DIAGNOSIS — Z87891 Personal history of nicotine dependence: Secondary | ICD-10-CM | POA: Insufficient documentation

## 2015-05-08 DIAGNOSIS — I10 Essential (primary) hypertension: Secondary | ICD-10-CM | POA: Insufficient documentation

## 2015-05-08 DIAGNOSIS — G629 Polyneuropathy, unspecified: Secondary | ICD-10-CM | POA: Diagnosis not present

## 2015-05-08 DIAGNOSIS — Z807 Family history of other malignant neoplasms of lymphoid, hematopoietic and related tissues: Secondary | ICD-10-CM

## 2015-05-08 DIAGNOSIS — C8313 Mantle cell lymphoma, intra-abdominal lymph nodes: Secondary | ICD-10-CM

## 2015-05-08 DIAGNOSIS — C8590 Non-Hodgkin lymphoma, unspecified, unspecified site: Secondary | ICD-10-CM

## 2015-05-08 DIAGNOSIS — K3184 Gastroparesis: Secondary | ICD-10-CM | POA: Insufficient documentation

## 2015-05-08 LAB — CBC WITH DIFFERENTIAL/PLATELET
BASOS ABS: 0 10*3/uL (ref 0–0.1)
Basophils Relative: 0 %
EOS PCT: 1 %
Eosinophils Absolute: 0 10*3/uL (ref 0–0.7)
HCT: 42.1 % (ref 40.0–52.0)
Hemoglobin: 13.7 g/dL (ref 13.0–18.0)
Lymphocytes Relative: 9 %
Lymphs Abs: 0.6 10*3/uL — ABNORMAL LOW (ref 1.0–3.6)
MCH: 27.6 pg (ref 26.0–34.0)
MCHC: 32.5 g/dL (ref 32.0–36.0)
MCV: 84.9 fL (ref 80.0–100.0)
MONO ABS: 0.4 10*3/uL (ref 0.2–1.0)
Monocytes Relative: 6 %
Neutro Abs: 5.9 10*3/uL (ref 1.4–6.5)
Neutrophils Relative %: 84 %
PLATELETS: 222 10*3/uL (ref 150–440)
RBC: 4.96 MIL/uL (ref 4.40–5.90)
RDW: 14.1 % (ref 11.5–14.5)
WBC: 7 10*3/uL (ref 3.8–10.6)

## 2015-05-08 LAB — COMPREHENSIVE METABOLIC PANEL
ALT: 11 U/L — ABNORMAL LOW (ref 17–63)
ANION GAP: 7 (ref 5–15)
AST: 21 U/L (ref 15–41)
Albumin: 3.4 g/dL — ABNORMAL LOW (ref 3.5–5.0)
Alkaline Phosphatase: 105 U/L (ref 38–126)
BUN: 10 mg/dL (ref 6–20)
CHLORIDE: 106 mmol/L (ref 101–111)
CO2: 28 mmol/L (ref 22–32)
CREATININE: 0.97 mg/dL (ref 0.61–1.24)
Calcium: 8.1 mg/dL — ABNORMAL LOW (ref 8.9–10.3)
GLUCOSE: 140 mg/dL — AB (ref 65–99)
Potassium: 3.1 mmol/L — ABNORMAL LOW (ref 3.5–5.1)
Sodium: 141 mmol/L (ref 135–145)
Total Bilirubin: 0.9 mg/dL (ref 0.3–1.2)
Total Protein: 6.9 g/dL (ref 6.5–8.1)

## 2015-05-08 LAB — LACTATE DEHYDROGENASE: LDH: 146 U/L (ref 98–192)

## 2015-05-15 NOTE — Progress Notes (Signed)
[place]  [phone] [phone]) [phone]  ID: [name] OB: 10/07/1946  MR#: [id number]  KHV#:[id number]  Patient Care Team: [name], MD as PCP - General (Internal Medicine)  CHIEF COMPLAINT:  Chief Complaint  Patient presents with  . Follow-up    lymphoma    INTERVAL HISTORY: Patient returns to clinic today for further evaluation for his low-grade Mantle cell lymphoma. He currently feels well and is asymptomatic. He denies any fevers, chills, or night sweats. He has a good appetite and denies weight loss. He has no neurologic complaints. He denies any chest pain or shortness of breath. He has no nausea, vomiting, constipation, or diarrhea. He has no urinary complaints. Patient feels at his baseline and offers no specific complaints today.  REVIEW OF SYSTEMS:   Review of Systems  Constitutional: Negative for fever, weight loss and malaise/fatigue.  Respiratory: Negative.   Cardiovascular: Negative.   Musculoskeletal: Negative.   Neurological: Negative for weakness.    As per HPI. Otherwise, a complete review of systems is negatve.  PAST MEDICAL HISTORY: Past Medical History  Diagnosis Date  . Peripheral neuropathy   . Hypertension   . 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 Hypercholesterolemia   . Gastroparesis   . Graves disease   . Depression   . Mantle cell lymphoma     PAST SURGICAL HISTORY: Past Surgical History  Procedure Laterality Date  . Nephrectomy      right    FAMILY HISTORY Family History  Problem Relation Age of Onset  . Breast cancer Mother   . Diabetes Father   . Cancer Father     Lymphoma  . Alcohol abuse Maternal Uncle   . Diabetes Sister   . Heart disease      grandfather       ADVANCED DIRECTIVES:    HEALTH MAINTENANCE: History  Substance Use Topics  . Smoking status: Former Research scientist (life sciences)  . Smokeless tobacco: Current User    Types:  Chew  . Alcohol Use: 0.0 oz/week    0 Standard drinks or equivalent per week     Colonoscopy:  PAP:  Bone density:  Lipid panel:  Allergies  Allergen Reactions  . Rofecoxib Nausea Only    Current Outpatient Prescriptions  Medication Sig Dispense Refill  . aspirin EC 325 MG tablet Take by mouth.    Marland Kitchen FLUoxetine (PROZAC) 40 MG capsule Take 40 mg by mouth daily.  0  . fluticasone (FLOVENT HFA) 110 MCG/ACT inhaler Inhale 2 puffs into the lungs 2 (two) times daily. 3 Inhaler 1  . insulin aspart (NOVOLOG) 100 UNIT/ML injection Use as directed 1 vial 12  . lisinopril (PRINIVIL,ZESTRIL) 20 MG tablet TAKE 1 TABLET BY MOUTH AT BEDTIME 90 tablet 1  . methimazole (TAPAZOLE) 10 MG tablet Take 20 mg by mouth 2 (two) times daily.    . metoCLOPramide (REGLAN) 10 MG tablet TAKE 1 TABLET (10 MG TOTAL) BY MOUTH AT BEDTIME. 90 tablet 1  . pantoprazole (PROTONIX) 40 MG tablet TAKE 1 TABLET (40 MG TOTAL) BY MOUTH DAILY. 90 tablet 1  . pravastatin (PRAVACHOL) 40 MG tablet TAKE 1 TABLET BY MOUTH AT BEDTIME 90 tablet 1  . sucralfate (CARAFATE) 1 G tablet TAKE 1 TABLET BY MOUTH 4 TIMES A DAY BEFORE MEALS AND AT BEDTIME FOR 30 DAYS 120 tablet 0  . traZODone (DESYREL) 50 MG tablet Take 50 mg by mouth daily as needed.  No current facility-administered medications for this visit.    OBJECTIVE: Filed Vitals:   05/08/15 1425  BP: 146/74  Pulse: 89  Temp: 96.2 F (35.7 C)  Resp: 18     Body mass index is 31.57 kg/(m^2).    ECOG FS:0 - Asymptomatic  General: Well-developed, well-nourished, no acute distress. Eyes: Pink conjunctiva, anicteric sclera. Lungs: Clear to auscultation bilaterally. Heart: Regular rate and rhythm. No rubs, murmurs, or gallops. Abdomen: Soft, nontender, nondistended. No organomegaly noted, normoactive bowel sounds. Musculoskeletal: No edema, cyanosis, or clubbing. Neuro: Alert, answering all questions appropriately. Cranial nerves grossly intact. Skin: No rashes or petechiae  noted. Psych: Normal affect. Lymphatics: No cervical, calvicular, axillary or inguinal LAD.   LAB RESULTS:  Lab Results  Component Value Date   NA 141 05/08/2015   K 3.1* 05/08/2015   CL 106 05/08/2015   CO2 28 05/08/2015   GLUCOSE 140* 05/08/2015   BUN 10 05/08/2015   CREATININE 0.97 05/08/2015   CALCIUM 8.1* 05/08/2015   PROT 6.9 05/08/2015   ALBUMIN 3.4* 05/08/2015   AST 21 05/08/2015   ALT 11* 05/08/2015   ALKPHOS 105 05/08/2015   BILITOT 0.9 05/08/2015   GFRNONAA >60 05/08/2015   GFRAA >60 05/08/2015    Lab Results  Component Value Date   WBC 7.0 05/08/2015   NEUTROABS 5.9 05/08/2015   HGB 13.7 05/08/2015   HCT 42.1 05/08/2015   MCV 84.9 05/08/2015   PLT 222 05/08/2015     STUDIES: No results found.  ASSESSMENT: Low-grade mantle cell lymphoma diagnosed in September 2015.  PLAN:    1. Mantle cell lymphoma: CT scans from May 2016 revealed no substantial change or progressive lymphadenopathy.  No intervention is needed at this time. Patient has not required treatment at this point, but if he had progressive disease would consider Rituxan plus Treanda. No bone marrow biopsy has been performed. This was previously discussed and determined it would not change overall treatment or prognosis. Return to clinic in September 2016 for repeat imaging and further evaluation. If everything remains stable, can consider imaging every 6 months at that point. 2. Decreased ejection fraction: Proceed patient had an ejection fraction of 40% with no clinical CHF. Monitor. 3. Diabetes: Continue insulin pump as directed.   Patient expressed understanding and was in agreement with this plan. He also understands that He can call clinic at any time with any questions, concerns, or complaints.    Lloyd Huger, MD   05/15/2015 9:08 AM

## 2015-05-20 DIAGNOSIS — F4321 Adjustment disorder with depressed mood: Secondary | ICD-10-CM | POA: Diagnosis not present

## 2015-05-31 DIAGNOSIS — E0500 Thyrotoxicosis with diffuse goiter without thyrotoxic crisis or storm: Secondary | ICD-10-CM | POA: Diagnosis not present

## 2015-05-31 DIAGNOSIS — E10649 Type 1 diabetes mellitus with hypoglycemia without coma: Secondary | ICD-10-CM | POA: Diagnosis not present

## 2015-06-11 DIAGNOSIS — Z4681 Encounter for fitting and adjustment of insulin pump: Secondary | ICD-10-CM | POA: Diagnosis not present

## 2015-06-11 DIAGNOSIS — E785 Hyperlipidemia, unspecified: Secondary | ICD-10-CM | POA: Diagnosis not present

## 2015-06-11 DIAGNOSIS — E10649 Type 1 diabetes mellitus with hypoglycemia without coma: Secondary | ICD-10-CM | POA: Diagnosis not present

## 2015-06-11 DIAGNOSIS — E0500 Thyrotoxicosis with diffuse goiter without thyrotoxic crisis or storm: Secondary | ICD-10-CM | POA: Diagnosis not present

## 2015-06-26 ENCOUNTER — Ambulatory Visit
Admission: RE | Admit: 2015-06-26 | Discharge: 2015-06-26 | Disposition: A | Discharge status: Home / Self Care | Payer: Medicare Other | Source: Ambulatory Visit | Attending: Oncology | Admitting: Oncology

## 2015-06-26 ENCOUNTER — Inpatient Hospital Stay: Payer: Medicare Other | Attending: Family Medicine

## 2015-06-26 DIAGNOSIS — E780 Pure hypercholesterolemia: Secondary | ICD-10-CM | POA: Insufficient documentation

## 2015-06-26 DIAGNOSIS — I10 Essential (primary) hypertension: Secondary | ICD-10-CM | POA: Insufficient documentation

## 2015-06-26 DIAGNOSIS — G629 Polyneuropathy, unspecified: Secondary | ICD-10-CM | POA: Diagnosis not present

## 2015-06-26 DIAGNOSIS — F329 Major depressive disorder, single episode, unspecified: Secondary | ICD-10-CM | POA: Diagnosis not present

## 2015-06-26 DIAGNOSIS — C8318 Mantle cell lymphoma, lymph nodes of multiple sites: Secondary | ICD-10-CM | POA: Insufficient documentation

## 2015-06-26 DIAGNOSIS — C8590 Non-Hodgkin lymphoma, unspecified, unspecified site: Secondary | ICD-10-CM | POA: Diagnosis present

## 2015-06-26 DIAGNOSIS — R911 Solitary pulmonary nodule: Secondary | ICD-10-CM | POA: Diagnosis not present

## 2015-06-26 DIAGNOSIS — Z905 Acquired absence of kidney: Secondary | ICD-10-CM | POA: Insufficient documentation

## 2015-06-26 DIAGNOSIS — Z7982 Long term (current) use of aspirin: Secondary | ICD-10-CM | POA: Diagnosis not present

## 2015-06-26 DIAGNOSIS — K4020 Bilateral inguinal hernia, without obstruction or gangrene, not specified as recurrent: Secondary | ICD-10-CM | POA: Diagnosis not present

## 2015-06-26 DIAGNOSIS — Z87891 Personal history of nicotine dependence: Secondary | ICD-10-CM | POA: Diagnosis not present

## 2015-06-26 DIAGNOSIS — Z79899 Other long term (current) drug therapy: Secondary | ICD-10-CM | POA: Diagnosis not present

## 2015-06-26 DIAGNOSIS — I2510 Atherosclerotic heart disease of native coronary artery without angina pectoris: Secondary | ICD-10-CM | POA: Diagnosis not present

## 2015-06-26 LAB — CBC WITH DIFFERENTIAL/PLATELET
BASOS ABS: 0.1 10*3/uL (ref 0–0.1)
Basophils Relative: 1 %
EOS PCT: 4 %
Eosinophils Absolute: 0.2 10*3/uL (ref 0–0.7)
HCT: 39.2 % — ABNORMAL LOW (ref 40.0–52.0)
HEMOGLOBIN: 13 g/dL (ref 13.0–18.0)
LYMPHS ABS: 1.4 10*3/uL (ref 1.0–3.6)
LYMPHS PCT: 26 %
MCH: 28.2 pg (ref 26.0–34.0)
MCHC: 33.2 g/dL (ref 32.0–36.0)
MCV: 85 fL (ref 80.0–100.0)
Monocytes Absolute: 0.4 10*3/uL (ref 0.2–1.0)
Monocytes Relative: 8 %
NEUTROS PCT: 61 %
Neutro Abs: 3.2 10*3/uL (ref 1.4–6.5)
PLATELETS: 166 10*3/uL (ref 150–440)
RBC: 4.62 MIL/uL (ref 4.40–5.90)
RDW: 14.9 % — ABNORMAL HIGH (ref 11.5–14.5)
WBC: 5.4 10*3/uL (ref 3.8–10.6)

## 2015-06-26 LAB — LACTATE DEHYDROGENASE: LDH: 166 U/L (ref 98–192)

## 2015-06-26 MED ORDER — IOHEXOL 300 MG/ML  SOLN
100.0000 mL | Freq: Once | INTRAMUSCULAR | Status: AC | PRN
  Administered 2015-06-26: 100 mL via INTRAVENOUS

## 2015-07-03 ENCOUNTER — Inpatient Hospital Stay (HOSPITAL_BASED_OUTPATIENT_CLINIC_OR_DEPARTMENT_OTHER): Payer: Medicare Other | Admitting: Oncology

## 2015-07-03 VITALS — BP 122/60 | HR 74 | Temp 99.2°F | Resp 18 | Wt 226.0 lb

## 2015-07-03 DIAGNOSIS — C8310 Mantle cell lymphoma, unspecified site: Secondary | ICD-10-CM

## 2015-07-03 DIAGNOSIS — C8318 Mantle cell lymphoma, lymph nodes of multiple sites: Secondary | ICD-10-CM | POA: Diagnosis not present

## 2015-07-03 DIAGNOSIS — I10 Essential (primary) hypertension: Secondary | ICD-10-CM | POA: Diagnosis not present

## 2015-07-03 DIAGNOSIS — Z79899 Other long term (current) drug therapy: Secondary | ICD-10-CM

## 2015-07-03 DIAGNOSIS — I2510 Atherosclerotic heart disease of native coronary artery without angina pectoris: Secondary | ICD-10-CM

## 2015-07-03 DIAGNOSIS — R911 Solitary pulmonary nodule: Secondary | ICD-10-CM

## 2015-07-03 DIAGNOSIS — E780 Pure hypercholesterolemia: Secondary | ICD-10-CM

## 2015-07-03 DIAGNOSIS — Z87891 Personal history of nicotine dependence: Secondary | ICD-10-CM

## 2015-07-03 DIAGNOSIS — Z905 Acquired absence of kidney: Secondary | ICD-10-CM

## 2015-07-03 DIAGNOSIS — F329 Major depressive disorder, single episode, unspecified: Secondary | ICD-10-CM

## 2015-07-03 DIAGNOSIS — Z7982 Long term (current) use of aspirin: Secondary | ICD-10-CM

## 2015-07-03 DIAGNOSIS — K4020 Bilateral inguinal hernia, without obstruction or gangrene, not specified as recurrent: Secondary | ICD-10-CM

## 2015-07-03 DIAGNOSIS — G629 Polyneuropathy, unspecified: Secondary | ICD-10-CM

## 2015-07-11 ENCOUNTER — Other Ambulatory Visit: Payer: Self-pay | Admitting: Internal Medicine

## 2015-07-16 NOTE — Progress Notes (Signed)
[facility]  [phone] [phone]  ID: Gary [name] OB: 1946/01/17  MR#: [id number]  EVO#:[id number]  Patient Care Team: [name], MD as PCP - General (Internal Medicine)  CHIEF COMPLAINT:  Chief Complaint  Patient presents with  . Lymphoma    INTERVAL HISTORY: Patient returns to clinic today for further evaluation and discussion of his imaging results. He continues to feel well and is asymptomatic. He denies any fevers, chills, or night sweats. He has a good appetite and denies weight loss. He has no neurologic complaints. He denies any chest pain or shortness of breath. He has no nausea, vomiting, constipation, or diarrhea. He has no urinary complaints. Patient offers no specific complaints today.  REVIEW OF SYSTEMS:   Review of Systems  Constitutional: Negative for fever, weight loss and malaise/fatigue.  Respiratory: Negative.   Cardiovascular: Negative.   Musculoskeletal: Negative.   Neurological: Negative for weakness.    As per HPI. Otherwise, a complete review of systems is negatve.  PAST MEDICAL HISTORY: Past Medical History  Diagnosis Date  . Peripheral neuropathy   . Hypertension   . 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 Hypercholesterolemia   . Gastroparesis   . Graves disease   . Depression   . Mantle cell lymphoma     PAST SURGICAL HISTORY: Past Surgical History  Procedure Laterality Date  . Nephrectomy      right    FAMILY HISTORY Family History  Problem Relation Age of Onset  . Breast cancer Mother   . Diabetes Father   . Cancer Father     Lymphoma  . Alcohol abuse Maternal Uncle   . Diabetes Sister   . Heart disease      grandfather       ADVANCED DIRECTIVES:    HEALTH MAINTENANCE: Social History  Substance Use Topics  . Smoking status: Former Research scientist (life sciences)  . Smokeless tobacco: Current User    Types: Chew  . Alcohol Use: 0.0 oz/week      0 Standard drinks or equivalent per week     Colonoscopy:  PAP:  Bone density:  Lipid panel:  Allergies  Allergen Reactions  . Rofecoxib Nausea Only    Current Outpatient Prescriptions  Medication Sig Dispense Refill  . aspirin EC 325 MG tablet Take by mouth.    Marland Kitchen FLUoxetine (PROZAC) 40 MG capsule Take 40 mg by mouth daily.  0  . fluticasone (FLOVENT HFA) 110 MCG/ACT inhaler Inhale 2 puffs into the lungs 2 (two) times daily. 3 Inhaler 1  . insulin aspart (NOVOLOG) 100 UNIT/ML injection Use as directed 1 vial 12  . lisinopril (PRINIVIL,ZESTRIL) 20 MG tablet TAKE 1 TABLET BY MOUTH AT BEDTIME 90 tablet 1  . methimazole (TAPAZOLE) 10 MG tablet Take 20 mg by mouth 2 (two) times daily.    . metoCLOPramide (REGLAN) 10 MG tablet TAKE 1 TABLET (10 MG TOTAL) BY MOUTH AT BEDTIME. 90 tablet 1  . pantoprazole (PROTONIX) 40 MG tablet TAKE 1 TABLET (40 MG TOTAL) BY MOUTH DAILY. 90 tablet 1  . sucralfate (CARAFATE) 1 G tablet TAKE 1 TABLET BY MOUTH 4 TIMES A DAY BEFORE MEALS AND AT BEDTIME FOR 30 DAYS 120 tablet 0  . traZODone (DESYREL) 50 MG tablet Take 50 mg by mouth daily as needed.    . pravastatin (PRAVACHOL) 40 MG tablet TAKE 1 TABLET BY MOUTH AT BEDTIME 90 tablet 0   No current facility-administered medications for this  visit.    OBJECTIVE: Filed Vitals:   07/03/15 1551  BP: 122/60  Pulse: 74  Temp: 99.2 F (37.3 C)  Resp: 18     Body mass index is 32.42 kg/(m^2).    ECOG FS:0 - Asymptomatic  General: Well-developed, well-nourished, no acute distress. Eyes: Pink conjunctiva, anicteric sclera. Lungs: Clear to auscultation bilaterally. Heart: Regular rate and rhythm. No rubs, murmurs, or gallops. Abdomen: Soft, nontender, nondistended. No organomegaly noted, normoactive bowel sounds. Musculoskeletal: No edema, cyanosis, or clubbing. Neuro: Alert, answering all questions appropriately. Cranial nerves grossly intact. Skin: No rashes or petechiae noted. Psych: Normal  affect. Lymphatics: No cervical, calvicular, axillary or inguinal LAD.   LAB RESULTS:  Lab Results  Component Value Date   NA 141 05/08/2015   K 3.1* 05/08/2015   CL 106 05/08/2015   CO2 28 05/08/2015   GLUCOSE 140* 05/08/2015   BUN 10 05/08/2015   CREATININE 0.97 05/08/2015   CALCIUM 8.1* 05/08/2015   PROT 6.9 05/08/2015   ALBUMIN 3.4* 05/08/2015   AST 21 05/08/2015   ALT 11* 05/08/2015   ALKPHOS 105 05/08/2015   BILITOT 0.9 05/08/2015   GFRNONAA >60 05/08/2015   GFRAA >60 05/08/2015    Lab Results  Component Value Date   WBC 5.4 06/26/2015   NEUTROABS 3.2 06/26/2015   HGB 13.0 06/26/2015   HCT 39.2* 06/26/2015   MCV 85.0 06/26/2015   PLT 166 06/26/2015     STUDIES: Ct Chest W Contrast  06/26/2015   CLINICAL DATA:  Low-grade mantle cell lymphoma.  EXAM: CT CHEST, ABDOMEN, AND PELVIS WITH CONTRAST  TECHNIQUE: Multidetector CT imaging of the chest, abdomen and pelvis was performed following the standard protocol during bolus administration of intravenous contrast.  CONTRAST:  130m OMNIPAQUE IOHEXOL 300 MG/ML  SOLN  COMPARISON:  02/27/2015.  FINDINGS: CT CHEST FINDINGS  Mediastinum/Nodes: Mediastinal and bi axillary lymph nodes are not enlarged by CT size criteria. No hilar adenopathy. Coronary artery calcification. Heart size normal. No pericardial effusion.  Lungs/Pleura: 4 mm medial left upper lobe nodule) series 5, image 16), stable. Mild paraseptal emphysema. No pleural fluid. Airway is unremarkable.  Musculoskeletal: No worrisome lytic or sclerotic lesions. Degenerative changes are seen in the spine.  CT ABDOMEN AND PELVIS FINDINGS  Hepatobiliary: Liver is unremarkable. Cholecystectomy. No biliary ductal dilatation.  Pancreas: Negative.  Spleen: Negative.  Adrenals/Urinary Tract: Adrenal glands are unremarkable. Right nephrectomy. 8 mm low-attenuation lesion off the lower pole left kidney is too small to definitively characterize but statistically, a cyst is most likely.  Left ureter is decompressed. Bladder is unremarkable.  Stomach/Bowel: Stomach, small bowel and colon are unremarkable.  Vascular/Lymphatic: Abdominal retroperitoneal lymph nodes measure up to 1.4 cm in short axis in the low left periaortic station (series 2, image 86), stable. Bilateral common iliac lymph nodes measure up to 1.6 cm on the left (image 99), previously 1.4 cm. Bilateral external iliac lymph nodes measure up to 2.0 cm on the left (image 108), previously 1.7 cm. Inguinal lymph nodes measure up to 2.4 cm on the right (image 114), previously 1.7 cm.  Reproductive: Prostate is mildly enlarged.  Other: Small bilateral inguinal hernias contain fat. A a 5.8 cm long axis fluid collection in the left groin has decreased in size slightly from 7.6 cm. No free fluid. Mesenteries and peritoneum are otherwise unremarkable.  Musculoskeletal: No worrisome lytic or sclerotic lesions. Degenerative changes are seen in the spine.  IMPRESSION: 1. Persistent abdominal/pelvic/inguinal adenopathy with slight increase in size of pelvic and  inguinal lymph nodes. 2. Coronary artery calcification. 3. Fluid collection in the left groin is decreased slightly in size and may represent a seroma. 4. Enlarged prostate.   Electronically Signed   By: Lorin Picket M.D.   On: 06/26/2015 15:52   Ct Abdomen Pelvis W Contrast  06/26/2015   CLINICAL DATA:  Low-grade mantle cell lymphoma.  EXAM: CT CHEST, ABDOMEN, AND PELVIS WITH CONTRAST  TECHNIQUE: Multidetector CT imaging of the chest, abdomen and pelvis was performed following the standard protocol during bolus administration of intravenous contrast.  CONTRAST:  18m OMNIPAQUE IOHEXOL 300 MG/ML  SOLN  COMPARISON:  02/27/2015.  FINDINGS: CT CHEST FINDINGS  Mediastinum/Nodes: Mediastinal and bi axillary lymph nodes are not enlarged by CT size criteria. No hilar adenopathy. Coronary artery calcification. Heart size normal. No pericardial effusion.  Lungs/Pleura: 4 mm medial left upper  lobe nodule) series 5, image 16), stable. Mild paraseptal emphysema. No pleural fluid. Airway is unremarkable.  Musculoskeletal: No worrisome lytic or sclerotic lesions. Degenerative changes are seen in the spine.  CT ABDOMEN AND PELVIS FINDINGS  Hepatobiliary: Liver is unremarkable. Cholecystectomy. No biliary ductal dilatation.  Pancreas: Negative.  Spleen: Negative.  Adrenals/Urinary Tract: Adrenal glands are unremarkable. Right nephrectomy. 8 mm low-attenuation lesion off the lower pole left kidney is too small to definitively characterize but statistically, a cyst is most likely. Left ureter is decompressed. Bladder is unremarkable.  Stomach/Bowel: Stomach, small bowel and colon are unremarkable.  Vascular/Lymphatic: Abdominal retroperitoneal lymph nodes measure up to 1.4 cm in short axis in the low left periaortic station (series 2, image 86), stable. Bilateral common iliac lymph nodes measure up to 1.6 cm on the left (image 99), previously 1.4 cm. Bilateral external iliac lymph nodes measure up to 2.0 cm on the left (image 108), previously 1.7 cm. Inguinal lymph nodes measure up to 2.4 cm on the right (image 114), previously 1.7 cm.  Reproductive: Prostate is mildly enlarged.  Other: Small bilateral inguinal hernias contain fat. A a 5.8 cm long axis fluid collection in the left groin has decreased in size slightly from 7.6 cm. No free fluid. Mesenteries and peritoneum are otherwise unremarkable.  Musculoskeletal: No worrisome lytic or sclerotic lesions. Degenerative changes are seen in the spine.  IMPRESSION: 1. Persistent abdominal/pelvic/inguinal adenopathy with slight increase in size of pelvic and inguinal lymph nodes. 2. Coronary artery calcification. 3. Fluid collection in the left groin is decreased slightly in size and may represent a seroma. 4. Enlarged prostate.   Electronically Signed   By: MLorin PicketM.D.   On: 06/26/2015 15:52    ASSESSMENT: Low-grade mantle cell lymphoma diagnosed in  September 2015.  PLAN:    1. Mantle cell lymphoma: CT scans reviewed independently and reported as above with persistent adenopathy consistent with mantle cell lymphoma. No intervention is needed at this time. Patient has not required treatment at this point, but if he had progressive disease would consider Rituxan plus Treanda. No bone marrow biopsy has been performed. This was previously discussed and determined it would not change overall treatment or prognosis. Return to clinic in 6 months with repeat imaging and further evaluation. 2. Decreased ejection fraction: Previously patient had an ejection fraction of 40% with no clinical CHF. Monitor. 3. Diabetes: Continue insulin pump as directed.  Patient expressed understanding and was in agreement with this plan. He also understands that He can call clinic at any time with any questions, concerns, or complaints.    TLloyd Huger MD   07/16/2015 1:53  PM

## 2015-08-09 ENCOUNTER — Encounter: Payer: Self-pay | Admitting: Internal Medicine

## 2015-08-09 ENCOUNTER — Ambulatory Visit (INDEPENDENT_AMBULATORY_CARE_PROVIDER_SITE_OTHER): Payer: Medicare Other | Admitting: Internal Medicine

## 2015-08-09 VITALS — BP 122/70 | HR 77 | Temp 97.9°F | Resp 18 | Ht 70.0 in | Wt 230.0 lb

## 2015-08-09 DIAGNOSIS — E079 Disorder of thyroid, unspecified: Secondary | ICD-10-CM

## 2015-08-09 DIAGNOSIS — F4321 Adjustment disorder with depressed mood: Secondary | ICD-10-CM

## 2015-08-09 DIAGNOSIS — Z658 Other specified problems related to psychosocial circumstances: Secondary | ICD-10-CM

## 2015-08-09 DIAGNOSIS — I10 Essential (primary) hypertension: Secondary | ICD-10-CM | POA: Diagnosis not present

## 2015-08-09 DIAGNOSIS — K219 Gastro-esophageal reflux disease without esophagitis: Secondary | ICD-10-CM

## 2015-08-09 DIAGNOSIS — C8310 Mantle cell lymphoma, unspecified site: Secondary | ICD-10-CM

## 2015-08-09 DIAGNOSIS — Z860100 Personal history of colon polyps, unspecified: Secondary | ICD-10-CM

## 2015-08-09 DIAGNOSIS — Z23 Encounter for immunization: Secondary | ICD-10-CM

## 2015-08-09 DIAGNOSIS — E7800 Pure hypercholesterolemia, unspecified: Secondary | ICD-10-CM

## 2015-08-09 DIAGNOSIS — K3184 Gastroparesis: Secondary | ICD-10-CM

## 2015-08-09 DIAGNOSIS — F439 Reaction to severe stress, unspecified: Secondary | ICD-10-CM

## 2015-08-09 DIAGNOSIS — I2510 Atherosclerotic heart disease of native coronary artery without angina pectoris: Secondary | ICD-10-CM

## 2015-08-09 DIAGNOSIS — E1151 Type 2 diabetes mellitus with diabetic peripheral angiopathy without gangrene: Secondary | ICD-10-CM

## 2015-08-09 LAB — BASIC METABOLIC PANEL
BUN: 12 mg/dL (ref 6–23)
CALCIUM: 8.4 mg/dL (ref 8.4–10.5)
CO2: 29 mEq/L (ref 19–32)
CREATININE: 0.96 mg/dL (ref 0.40–1.50)
Chloride: 104 mEq/L (ref 96–112)
GFR: 83.02 mL/min (ref >60.00)
Glucose, Bld: 204 mg/dL — ABNORMAL HIGH (ref 70–99)
Potassium: 4.4 mEq/L (ref 3.5–5.1)
Sodium: 140 mEq/L (ref 135–145)

## 2015-08-09 LAB — HEPATIC FUNCTION PANEL
ALK PHOS: 79 U/L (ref 39–117)
ALT: 8 U/L (ref 0–53)
AST: 19 U/L (ref 0–37)
Albumin: 3.4 g/dL — ABNORMAL LOW (ref 3.5–5.2)
BILIRUBIN DIRECT: 0.2 mg/dL (ref 0.0–0.3)
TOTAL PROTEIN: 6.7 g/dL (ref 6.0–8.3)
Total Bilirubin: 0.8 mg/dL (ref 0.2–1.2)

## 2015-08-09 LAB — LIPID PANEL
CHOLESTEROL: 197 mg/dL (ref 0–200)
HDL: 73.6 mg/dL (ref >39.00)
LDL Cholesterol: 113 mg/dL — ABNORMAL HIGH (ref 0–99)
NonHDL: 123.06
TRIGLYCERIDES: 51 mg/dL (ref 0.0–149.0)
Total CHOL/HDL Ratio: 3
VLDL: 10.2 mg/dL (ref 0.0–40.0)

## 2015-08-09 NOTE — Progress Notes (Signed)
[name], male   DOB: Dec 25, 1945, 69 y.o.   MRN: [id number]   Subjective:    [name], male    DOB: 04-15-1946, 69 y.o.   MRN: [id number]  HPI  Patient with past history of diabetes, hypertension, hypercholesterolemia, graves disease and depression.  He comes in today to follow up on these issues as well as for a complete physical exam.  He is seeing Dr Eddie Dibbles for his diabetes and thyroid issues.  No problems recently with low blood sugars.  Due to see Dr Thomasene Ripple soon - for his eyes.  Seeing Dr [name] for mantle cell lymphoma.  Stable.  Also was recently evaluated by GI.  Recommended continuing carafate, reglan and protonix.  Doing well from a GI standpoint.  No chest pain or tightness.  No sob.  No acid reflux.  No abdominal pain or cramping.  Bowels stable.  Increased stress.  Son is back at home.  Still seeing psychiatry.  No increased depression.    Past Medical History  Diagnosis Date  . Peripheral neuropathy ([place])   . Hypertension   . 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 Hypercholesterolemia   . Gastroparesis   . Graves disease   . Depression   . Mantle cell lymphoma [facility])    Past Surgical History  Procedure Laterality Date  . Nephrectomy      right   Family History  Problem Relation Age of Onset  . Breast cancer Mother   . Diabetes Father   . Cancer Father     Lymphoma  . Alcohol abuse Maternal Uncle   . Diabetes Sister   . Heart disease      grandfather   Social History   Social History  . Marital Status: Married    Spouse Name: N/A  . Number of Children: 1  . Years of Education: N/A   Social History Main Topics  . Smoking status: Former Research scientist (life sciences)  . Smokeless tobacco: Current User    Types: Chew  . Alcohol Use: 0.0 oz/week    0 Standard drinks or equivalent per week  . Drug Use: No  . Sexual Activity: Not Asked   Other Topics Concern  . None   Social History  Narrative    Outpatient Encounter Prescriptions as of 08/09/2015  Medication Sig  . aspirin EC 325 MG tablet Take by mouth.  Marland Kitchen FLUoxetine (PROZAC) 40 MG capsule Take 40 mg by mouth daily.  . fluticasone (FLOVENT HFA) 110 MCG/ACT inhaler Inhale 2 puffs into the lungs 2 (two) times daily.  . insulin aspart (NOVOLOG) 100 UNIT/ML injection Use as directed  . lisinopril (PRINIVIL,ZESTRIL) 20 MG tablet TAKE 1 TABLET BY MOUTH AT BEDTIME  . methimazole (TAPAZOLE) 10 MG tablet Take 20 mg by mouth 2 (two) times daily.  . metoCLOPramide (REGLAN) 10 MG tablet TAKE 1 TABLET (10 MG TOTAL) BY MOUTH AT BEDTIME.  . pantoprazole (PROTONIX) 40 MG tablet TAKE 1 TABLET (40 MG TOTAL) BY MOUTH DAILY.  . pravastatin (PRAVACHOL) 40 MG tablet TAKE 1 TABLET BY MOUTH AT BEDTIME  . sucralfate (CARAFATE) 1 G tablet TAKE 1 TABLET BY MOUTH 4 TIMES A DAY BEFORE MEALS AND AT BEDTIME FOR 30 DAYS  . traZODone (DESYREL) 50 MG tablet Take 50 mg by mouth daily as needed.   No facility-administered encounter medications on file as of 08/09/2015.    Review of Systems  Constitutional: Negative for fever and  appetite change.  HENT: Negative for congestion and sinus pressure.   Eyes: Negative for discharge and visual disturbance.  Respiratory: Negative for cough, chest tightness and shortness of breath.   Cardiovascular: Negative for chest pain, palpitations and leg swelling.  Gastrointestinal: Negative for nausea, vomiting, abdominal pain and diarrhea.  Genitourinary: Negative for dysuria and difficulty urinating.  Musculoskeletal: Negative for back pain and joint swelling.  Skin: Negative for color change and rash.  Neurological: Negative for dizziness, light-headedness and headaches.  Hematological: Negative for adenopathy. Does not bruise/bleed easily.  Psychiatric/Behavioral: Negative for dysphoric mood and agitation.       Objective:     Blood pressure rechecked by me:  130/74  Physical Exam  Constitutional: He  is oriented to person, place, and time. He appears well-developed and well-nourished. No distress.  HENT:  Head: Normocephalic and atraumatic.  Nose: Nose normal.  Mouth/Throat: Oropharynx is clear and moist. No oropharyngeal exudate.  Eyes: Conjunctivae are normal. Right eye exhibits no discharge. Left eye exhibits no discharge.  Neck: Neck supple. No thyromegaly present.  Cardiovascular: Normal rate and regular rhythm.   Pulmonary/Chest: Breath sounds normal. No respiratory distress. He has no wheezes.  Abdominal: Soft. Bowel sounds are normal. There is no tenderness.  Genitourinary:  Pt declined.   Musculoskeletal: He exhibits no edema or tenderness.  Lymphadenopathy:    He has no cervical adenopathy.  Neurological: He is alert and oriented to person, place, and time.  Skin: Skin is warm and dry. No rash noted. No erythema.  Psychiatric: He has a normal mood and affect. His behavior is normal.    BP 122/70 mmHg  Pulse 77  Temp(Src) 97.9 F (36.6 C) (Oral)  Resp 18  Ht 5\' 10"  (1.778 m)  Wt 230 lb (104.327 kg)  BMI 33.00 kg/m2  SpO2 97% Wt Readings from Last 3 Encounters:  08/09/15 230 lb (104.327 kg)  07/03/15 225 lb 15.5 oz (102.499 kg)  05/08/15 220 lb 0.3 oz (99.8 kg)     Lab Results  Component Value Date   WBC 5.4 06/26/2015   HGB 13.0 06/26/2015   HCT 39.2* 06/26/2015   PLT 166 06/26/2015   GLUCOSE 204* 08/09/2015   CHOL 197 08/09/2015   TRIG 51.0 08/09/2015   HDL 73.60 08/09/2015   LDLCALC 113* 08/09/2015   ALT 8 08/09/2015   AST 19 08/09/2015   NA 140 08/09/2015   K 4.4 08/09/2015   CL 104 08/09/2015   CREATININE 0.96 08/09/2015   BUN 12 08/09/2015   CO2 29 08/09/2015   INR 1.3 07/12/2014   HGBA1C 6.5 04/08/2015   MICROALBUR 1.4 04/08/2015    Ct Chest W Contrast  06/26/2015  CLINICAL DATA:  Low-grade mantle cell lymphoma. EXAM: CT CHEST, ABDOMEN, AND PELVIS WITH CONTRAST TECHNIQUE: Multidetector CT imaging of the chest, abdomen and pelvis was  performed following the standard protocol during bolus administration of intravenous contrast. CONTRAST:  134mL OMNIPAQUE IOHEXOL 300 MG/ML  SOLN COMPARISON:  02/27/2015. FINDINGS: CT CHEST FINDINGS Mediastinum/Nodes: Mediastinal and bi axillary lymph nodes are not enlarged by CT size criteria. No hilar adenopathy. Coronary artery calcification. Heart size normal. No pericardial effusion. Lungs/Pleura: 4 mm medial left upper lobe nodule) series 5, image 16), stable. Mild paraseptal emphysema. No pleural fluid. Airway is unremarkable. Musculoskeletal: No worrisome lytic or sclerotic lesions. Degenerative changes are seen in the spine. CT ABDOMEN AND PELVIS FINDINGS Hepatobiliary: Liver is unremarkable. Cholecystectomy. No biliary ductal dilatation. Pancreas: Negative. Spleen: Negative. Adrenals/Urinary Tract: Adrenal glands are  unremarkable. Right nephrectomy. 8 mm low-attenuation lesion off the lower pole left kidney is too small to definitively characterize but statistically, a cyst is most likely. Left ureter is decompressed. Bladder is unremarkable. Stomach/Bowel: Stomach, small bowel and colon are unremarkable. Vascular/Lymphatic: Abdominal retroperitoneal lymph nodes measure up to 1.4 cm in short axis in the low left periaortic station (series 2, image 86), stable. Bilateral common iliac lymph nodes measure up to 1.6 cm on the left (image 99), previously 1.4 cm. Bilateral external iliac lymph nodes measure up to 2.0 cm on the left (image 108), previously 1.7 cm. Inguinal lymph nodes measure up to 2.4 cm on the right (image 114), previously 1.7 cm. Reproductive: Prostate is mildly enlarged. Other: Small bilateral inguinal hernias contain fat. A a 5.8 cm long axis fluid collection in the left groin has decreased in size slightly from 7.6 cm. No free fluid. Mesenteries and peritoneum are otherwise unremarkable. Musculoskeletal: No worrisome lytic or sclerotic lesions. Degenerative changes are seen in the spine.  IMPRESSION: 1. Persistent abdominal/pelvic/inguinal adenopathy with slight increase in size of pelvic and inguinal lymph nodes. 2. Coronary artery calcification. 3. Fluid collection in the left groin is decreased slightly in size and may represent a seroma. 4. Enlarged prostate. Electronically Signed   By: Lorin Picket M.D.   On: 06/26/2015 15:52   Ct Abdomen Pelvis W Contrast  06/26/2015  CLINICAL DATA:  Low-grade mantle cell lymphoma. EXAM: CT CHEST, ABDOMEN, AND PELVIS WITH CONTRAST TECHNIQUE: Multidetector CT imaging of the chest, abdomen and pelvis was performed following the standard protocol during bolus administration of intravenous contrast. CONTRAST:  124mL OMNIPAQUE IOHEXOL 300 MG/ML  SOLN COMPARISON:  02/27/2015. FINDINGS: CT CHEST FINDINGS Mediastinum/Nodes: Mediastinal and bi axillary lymph nodes are not enlarged by CT size criteria. No hilar adenopathy. Coronary artery calcification. Heart size normal. No pericardial effusion. Lungs/Pleura: 4 mm medial left upper lobe nodule) series 5, image 16), stable. Mild paraseptal emphysema. No pleural fluid. Airway is unremarkable. Musculoskeletal: No worrisome lytic or sclerotic lesions. Degenerative changes are seen in the spine. CT ABDOMEN AND PELVIS FINDINGS Hepatobiliary: Liver is unremarkable. Cholecystectomy. No biliary ductal dilatation. Pancreas: Negative. Spleen: Negative. Adrenals/Urinary Tract: Adrenal glands are unremarkable. Right nephrectomy. 8 mm low-attenuation lesion off the lower pole left kidney is too small to definitively characterize but statistically, a cyst is most likely. Left ureter is decompressed. Bladder is unremarkable. Stomach/Bowel: Stomach, small bowel and colon are unremarkable. Vascular/Lymphatic: Abdominal retroperitoneal lymph nodes measure up to 1.4 cm in short axis in the low left periaortic station (series 2, image 86), stable. Bilateral common iliac lymph nodes measure up to 1.6 cm on the left (image 99),  previously 1.4 cm. Bilateral external iliac lymph nodes measure up to 2.0 cm on the left (image 108), previously 1.7 cm. Inguinal lymph nodes measure up to 2.4 cm on the right (image 114), previously 1.7 cm. Reproductive: Prostate is mildly enlarged. Other: Small bilateral inguinal hernias contain fat. A a 5.8 cm long axis fluid collection in the left groin has decreased in size slightly from 7.6 cm. No free fluid. Mesenteries and peritoneum are otherwise unremarkable. Musculoskeletal: No worrisome lytic or sclerotic lesions. Degenerative changes are seen in the spine. IMPRESSION: 1. Persistent abdominal/pelvic/inguinal adenopathy with slight increase in size of pelvic and inguinal lymph nodes. 2. Coronary artery calcification. 3. Fluid collection in the left groin is decreased slightly in size and may represent a seroma. 4. Enlarged prostate. Electronically Signed   By: Lorin Picket M.D.   On:  06/26/2015 15:52       Assessment & Plan:   Problem List Items Addressed This Visit    Adjustment disorder with depressed mood    Seeing Dr Nicolasa Ducking.  Feels stable on current regimen.  Follow.        CAD in native artery    Sees Dr Nehemiah Massed.  Felt stable.  Currently doing well.  Continue risk factor modification.      Diabetes ([place])    Followed by Dr Eddie Dibbles.  Insulin pump.  Sees Dr Thomasene Ripple for his eyes.        Essential hypertension, benign    Blood pressure under good control.  Continue same medication regimen.  Follow pressures.  Follow metabolic panel.        Relevant Orders   Basic metabolic panel (Completed)   Gastroparesis    On carafate, reglan and protonix.  Followed by GI.  Doing well.  GI recommended continuing current medication regimen.  Follow.       GERD (gastroesophageal reflux disease)    EGD 01/08/14 as outlined in overview.  On carafate and protonix.  Follow.  No symptoms now.        History of colonic polyps    Colonoscopy 01/08/14 - tubular adenoma.  Recommended f/u  colonoscopy in 12/2018.        Hypercholesterolemia    On pravastatin.  Low cholesterol diet and exercise.  Follow lipid panel and liver function tests.        Relevant Orders   Lipid panel (Completed)   Hepatic function panel (Completed)   Lymphoma ([facility])    Has mantle cell lymphoma.  Followed by Dr [name].  Just evaluated.  Stable.        Stress    Increased stress as outlined.  Seeing psychiatry.  On prozac and trazodone.  Feels is stable.  Follow.        Thyroid disease    Followed by Dr Eddie Dibbles.  On tapazol 10mg  q day now.  Follow.  Feels doing better.         Other Visit Diagnoses    Encounter for immunization    -  Primary        [name], MD

## 2015-08-09 NOTE — Progress Notes (Signed)
Pre-visit discussion using our clinic review tool. No additional management support is needed unless otherwise documented below in the visit note.  

## 2015-08-09 NOTE — Patient Instructions (Signed)

## 2015-08-11 ENCOUNTER — Encounter: Payer: Self-pay | Admitting: Internal Medicine

## 2015-08-11 DIAGNOSIS — F4321 Adjustment disorder with depressed mood: Secondary | ICD-10-CM | POA: Insufficient documentation

## 2015-08-11 NOTE — Assessment & Plan Note (Signed)
On carafate, reglan and protonix.  Followed by GI.  Doing well.  GI recommended continuing current medication regimen.  Follow.

## 2015-08-11 NOTE — Assessment & Plan Note (Signed)
Has mantle cell lymphoma.  Followed by Dr [name].  Just evaluated.  Stable.

## 2015-08-11 NOTE — Assessment & Plan Note (Signed)
Seeing Dr Nicolasa Ducking.  Feels stable on current regimen.  Follow.

## 2015-08-11 NOTE — Assessment & Plan Note (Signed)
EGD 01/08/14 as outlined in overview.  On carafate and protonix.  Follow.  No symptoms now.

## 2015-08-11 NOTE — Assessment & Plan Note (Signed)
On pravastatin.  Low cholesterol diet and exercise.  Follow lipid panel and liver function tests.   

## 2015-08-11 NOTE — Assessment & Plan Note (Signed)
Increased stress as outlined.  Seeing psychiatry.  On prozac and trazodone.  Feels is stable.  Follow.

## 2015-08-11 NOTE — Assessment & Plan Note (Signed)
Followed by Dr Eddie Dibbles.  Insulin pump.  Sees Dr Thomasene Ripple for his eyes.

## 2015-08-11 NOTE — Assessment & Plan Note (Signed)
Sees Dr Nehemiah Massed.  Felt stable.  Currently doing well.  Continue risk factor modification.

## 2015-08-11 NOTE — Assessment & Plan Note (Signed)
Colonoscopy 01/08/14 - tubular adenoma.  Recommended f/u colonoscopy in 12/2018.

## 2015-08-11 NOTE — Assessment & Plan Note (Signed)
Blood pressure under good control.  Continue same medication regimen.  Follow pressures.  Follow metabolic panel.   

## 2015-08-11 NOTE — Assessment & Plan Note (Signed)
Followed by Dr Eddie Dibbles.  On tapazol 10mg  q day now.  Follow.  Feels doing better.

## 2015-08-13 DIAGNOSIS — F4321 Adjustment disorder with depressed mood: Secondary | ICD-10-CM | POA: Diagnosis not present

## 2015-08-16 ENCOUNTER — Encounter: Payer: Self-pay | Admitting: *Deleted

## 2015-09-03 DIAGNOSIS — E10649 Type 1 diabetes mellitus with hypoglycemia without coma: Secondary | ICD-10-CM | POA: Diagnosis not present

## 2015-09-03 DIAGNOSIS — E0500 Thyrotoxicosis with diffuse goiter without thyrotoxic crisis or storm: Secondary | ICD-10-CM | POA: Diagnosis not present

## 2015-09-10 DIAGNOSIS — E785 Hyperlipidemia, unspecified: Secondary | ICD-10-CM | POA: Diagnosis not present

## 2015-09-10 DIAGNOSIS — E10649 Type 1 diabetes mellitus with hypoglycemia without coma: Secondary | ICD-10-CM | POA: Diagnosis not present

## 2015-09-10 DIAGNOSIS — Z4681 Encounter for fitting and adjustment of insulin pump: Secondary | ICD-10-CM | POA: Diagnosis not present

## 2015-09-10 DIAGNOSIS — E0500 Thyrotoxicosis with diffuse goiter without thyrotoxic crisis or storm: Secondary | ICD-10-CM | POA: Diagnosis not present

## 2015-09-10 DIAGNOSIS — E032 Hypothyroidism due to medicaments and other exogenous substances: Secondary | ICD-10-CM | POA: Diagnosis not present

## 2015-11-11 DIAGNOSIS — I2510 Atherosclerotic heart disease of native coronary artery without angina pectoris: Secondary | ICD-10-CM | POA: Diagnosis not present

## 2015-11-11 DIAGNOSIS — I428 Other cardiomyopathies: Secondary | ICD-10-CM | POA: Diagnosis not present

## 2015-11-11 DIAGNOSIS — E782 Mixed hyperlipidemia: Secondary | ICD-10-CM | POA: Diagnosis not present

## 2015-11-11 DIAGNOSIS — I10 Essential (primary) hypertension: Secondary | ICD-10-CM | POA: Diagnosis not present

## 2015-12-03 DIAGNOSIS — E10649 Type 1 diabetes mellitus with hypoglycemia without coma: Secondary | ICD-10-CM | POA: Diagnosis not present

## 2015-12-03 DIAGNOSIS — E0500 Thyrotoxicosis with diffuse goiter without thyrotoxic crisis or storm: Secondary | ICD-10-CM | POA: Diagnosis not present

## 2015-12-10 DIAGNOSIS — E10649 Type 1 diabetes mellitus with hypoglycemia without coma: Secondary | ICD-10-CM | POA: Diagnosis not present

## 2015-12-10 DIAGNOSIS — E1042 Type 1 diabetes mellitus with diabetic polyneuropathy: Secondary | ICD-10-CM | POA: Diagnosis not present

## 2015-12-10 DIAGNOSIS — K319 Disease of stomach and duodenum, unspecified: Secondary | ICD-10-CM | POA: Diagnosis not present

## 2015-12-10 DIAGNOSIS — E785 Hyperlipidemia, unspecified: Secondary | ICD-10-CM | POA: Diagnosis not present

## 2015-12-10 DIAGNOSIS — E1043 Type 1 diabetes mellitus with diabetic autonomic (poly)neuropathy: Secondary | ICD-10-CM | POA: Diagnosis not present

## 2015-12-10 DIAGNOSIS — Z4681 Encounter for fitting and adjustment of insulin pump: Secondary | ICD-10-CM | POA: Diagnosis not present

## 2015-12-10 DIAGNOSIS — E0500 Thyrotoxicosis with diffuse goiter without thyrotoxic crisis or storm: Secondary | ICD-10-CM | POA: Diagnosis not present

## 2015-12-11 ENCOUNTER — Encounter: Payer: Self-pay | Admitting: Internal Medicine

## 2015-12-11 ENCOUNTER — Ambulatory Visit (INDEPENDENT_AMBULATORY_CARE_PROVIDER_SITE_OTHER): Payer: Medicare Other | Admitting: Internal Medicine

## 2015-12-11 VITALS — BP 130/60 | HR 97 | Temp 97.9°F | Resp 18 | Ht 70.0 in | Wt 236.2 lb

## 2015-12-11 DIAGNOSIS — Z658 Other specified problems related to psychosocial circumstances: Secondary | ICD-10-CM

## 2015-12-11 DIAGNOSIS — K219 Gastro-esophageal reflux disease without esophagitis: Secondary | ICD-10-CM

## 2015-12-11 DIAGNOSIS — E079 Disorder of thyroid, unspecified: Secondary | ICD-10-CM | POA: Diagnosis not present

## 2015-12-11 DIAGNOSIS — E1151 Type 2 diabetes mellitus with diabetic peripheral angiopathy without gangrene: Secondary | ICD-10-CM | POA: Diagnosis not present

## 2015-12-11 DIAGNOSIS — F4321 Adjustment disorder with depressed mood: Secondary | ICD-10-CM

## 2015-12-11 DIAGNOSIS — C8310 Mantle cell lymphoma, unspecified site: Secondary | ICD-10-CM

## 2015-12-11 DIAGNOSIS — I10 Essential (primary) hypertension: Secondary | ICD-10-CM | POA: Diagnosis not present

## 2015-12-11 DIAGNOSIS — S61419A Laceration without foreign body of unspecified hand, initial encounter: Secondary | ICD-10-CM

## 2015-12-11 DIAGNOSIS — E7800 Pure hypercholesterolemia, unspecified: Secondary | ICD-10-CM

## 2015-12-11 DIAGNOSIS — F439 Reaction to severe stress, unspecified: Secondary | ICD-10-CM

## 2015-12-11 MED ORDER — MUPIROCIN 2 % EX OINT: TOPICAL_OINTMENT | CUTANEOUS | Status: DC

## 2015-12-11 NOTE — Progress Notes (Signed)
Pre-visit discussion using our clinic review tool. No additional management support is needed unless otherwise documented below in the visit note.  

## 2015-12-11 NOTE — Progress Notes (Signed)
[name], male   DOB: 01-02-1946, 70 y.o.   MRN: [id number]   Subjective:    [name], male    DOB: Jun 19, 1946, 70 y.o.   MRN: [id number]  HPI  Patient with past history of hypercholesterolemia, diabetes, graves disease and hypertension.  He comes in today to follow up on these issues.  Sees Dr Eddie Dibbles for his diabetes.  Had a recent low.  Fell.  Injured his hands.  Has bandaged.  States he gave himself extra insulin and changed pump and also was given extra insulin.  Saw Dr Eddie Dibbles after this episode occurred.  She reviewed his glucometer.  Has not been having significant issues with low sugars.  Scraped his forehead.  No direct head injury.  No headache or dizziness. No chest pain or tightness.  No sob.  No abdominal pain or cramping.  Bowels stable.  No other injuries.     Past Medical History  Diagnosis Date  . Peripheral neuropathy ([place])   . Hypertension   . 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 Hypercholesterolemia   . Gastroparesis   . Graves disease   . Depression   . Mantle cell lymphoma [facility])    Past Surgical History  Procedure Laterality Date  . Nephrectomy      right   Family History  Problem Relation Age of Onset  . Breast cancer Mother   . Diabetes Father   . Cancer Father     Lymphoma  . Alcohol abuse Maternal Uncle   . Diabetes Sister   . Heart disease      grandfather   Social History   Social History  . Marital Status: Married    Spouse Name: N/A  . Number of Children: 1  . Years of Education: N/A   Social History Main Topics  . Smoking status: Former Research scientist (life sciences)  . Smokeless tobacco: Current User    Types: Chew  . Alcohol Use: 0.0 oz/week    0 Standard drinks or equivalent per week  . Drug Use: No  . Sexual Activity: Not Asked   Other Topics Concern  . None   Social History Narrative    Outpatient Encounter Prescriptions as of 12/11/2015  Medication Sig  . aspirin  EC 325 MG tablet Take by mouth.  Marland Kitchen FLUoxetine (PROZAC) 40 MG capsule Take 40 mg by mouth daily.  . fluticasone (FLOVENT HFA) 110 MCG/ACT inhaler Inhale 2 puffs into the lungs 2 (two) times daily.  . insulin aspart (NOVOLOG) 100 UNIT/ML injection Use as directed  . Insulin Syringe-Needle U-100 (INSULIN SYRINGE .5CC/31GX5/16") 31G X 5/16" 0.5 ML MISC   . lisinopril (PRINIVIL,ZESTRIL) 20 MG tablet TAKE 1 TABLET BY MOUTH AT BEDTIME  . methimazole (TAPAZOLE) 10 MG tablet Take 20 mg by mouth 2 (two) times daily.  . metoCLOPramide (REGLAN) 10 MG tablet TAKE 1 TABLET (10 MG TOTAL) BY MOUTH AT BEDTIME.  . pantoprazole (PROTONIX) 40 MG tablet TAKE 1 TABLET (40 MG TOTAL) BY MOUTH DAILY.  . pravastatin (PRAVACHOL) 40 MG tablet TAKE 1 TABLET BY MOUTH AT BEDTIME  . sucralfate (CARAFATE) 1 G tablet TAKE 1 TABLET BY MOUTH 4 TIMES A DAY BEFORE MEALS AND AT BEDTIME FOR 30 DAYS  . traZODone (DESYREL) 50 MG tablet Take 50 mg by mouth daily as needed.  . mupirocin ointment (BACTROBAN) 2 % Apply to affected area bid   No facility-administered encounter medications on file as of  12/11/2015.    Review of Systems  Constitutional: Negative for appetite change and unexpected weight change.  HENT: Negative for congestion and sinus pressure.   Respiratory: Negative for cough, chest tightness and shortness of breath.   Cardiovascular: Negative for chest pain, palpitations and leg swelling.  Gastrointestinal: Negative for nausea, vomiting, abdominal pain and diarrhea.  Genitourinary: Negative for dysuria and difficulty urinating.  Musculoskeletal: Negative for back pain and joint swelling.  Skin:       Abrasions over both hands.  Some minimal increased soft tissue swelling.  No increased erythema.    Neurological: Negative for dizziness, light-headedness and headaches.  Psychiatric/Behavioral: Negative for dysphoric mood and agitation.       Objective:    Physical Exam  Constitutional: He appears well-developed  and well-nourished. No distress.  HENT:  Nose: Nose normal.  Mouth/Throat: Oropharynx is clear and moist.  Eyes: Conjunctivae are normal. Right eye exhibits no discharge. Left eye exhibits no discharge.  Neck: Neck supple. No thyromegaly present.  Cardiovascular: Normal rate and regular rhythm.   Pulmonary/Chest: Effort normal and breath sounds normal. No respiratory distress.  Abdominal: Soft. Bowel sounds are normal. There is no tenderness.  Musculoskeletal: He exhibits no edema or tenderness.  Lymphadenopathy:    He has no cervical adenopathy.  Skin: No rash noted.  Minimal increased soft tissue fullness hands.  Abrasions - both hands.  Right worse.  No increased erythema.    Psychiatric: He has a normal mood and affect. His behavior is normal.    BP 130/60 mmHg  Pulse 97  Temp(Src) 97.9 F (36.6 C) (Oral)  Resp 18  Ht 5\' 10"  (1.778 m)  Wt 236 lb 4 oz (107.162 kg)  BMI 33.90 kg/m2  SpO2 97% Wt Readings from Last 3 Encounters:  12/11/15 236 lb 4 oz (107.162 kg)  08/09/15 230 lb (104.327 kg)  07/03/15 225 lb 15.5 oz (102.499 kg)     Lab Results  Component Value Date   WBC 5.4 06/26/2015   HGB 13.0 06/26/2015   HCT 39.2* 06/26/2015   PLT 166 06/26/2015   GLUCOSE 204* 08/09/2015   CHOL 197 08/09/2015   TRIG 51.0 08/09/2015   HDL 73.60 08/09/2015   LDLCALC 113* 08/09/2015   ALT 8 08/09/2015   AST 19 08/09/2015   NA 140 08/09/2015   K 4.4 08/09/2015   CL 104 08/09/2015   CREATININE 0.96 08/09/2015   BUN 12 08/09/2015   CO2 29 08/09/2015   TSH 3.93 06/14/2014   INR 1.3 07/12/2014   HGBA1C 6.5 04/08/2015   MICROALBUR 1.4 04/08/2015    Ct Chest W Contrast  06/26/2015  CLINICAL DATA:  Low-grade mantle cell lymphoma. EXAM: CT CHEST, ABDOMEN, AND PELVIS WITH CONTRAST TECHNIQUE: Multidetector CT imaging of the chest, abdomen and pelvis was performed following the standard protocol during bolus administration of intravenous contrast. CONTRAST:  156mL OMNIPAQUE IOHEXOL  300 MG/ML  SOLN COMPARISON:  02/27/2015. FINDINGS: CT CHEST FINDINGS Mediastinum/Nodes: Mediastinal and bi axillary lymph nodes are not enlarged by CT size criteria. No hilar adenopathy. Coronary artery calcification. Heart size normal. No pericardial effusion. Lungs/Pleura: 4 mm medial left upper lobe nodule) series 5, image 16), stable. Mild paraseptal emphysema. No pleural fluid. Airway is unremarkable. Musculoskeletal: No worrisome lytic or sclerotic lesions. Degenerative changes are seen in the spine. CT ABDOMEN AND PELVIS FINDINGS Hepatobiliary: Liver is unremarkable. Cholecystectomy. No biliary ductal dilatation. Pancreas: Negative. Spleen: Negative. Adrenals/Urinary Tract: Adrenal glands are unremarkable. Right nephrectomy. 8 mm low-attenuation lesion off  the lower pole left kidney is too small to definitively characterize but statistically, a cyst is most likely. Left ureter is decompressed. Bladder is unremarkable. Stomach/Bowel: Stomach, small bowel and colon are unremarkable. Vascular/Lymphatic: Abdominal retroperitoneal lymph nodes measure up to 1.4 cm in short axis in the low left periaortic station (series 2, image 86), stable. Bilateral common iliac lymph nodes measure up to 1.6 cm on the left (image 99), previously 1.4 cm. Bilateral external iliac lymph nodes measure up to 2.0 cm on the left (image 108), previously 1.7 cm. Inguinal lymph nodes measure up to 2.4 cm on the right (image 114), previously 1.7 cm. Reproductive: Prostate is mildly enlarged. Other: Small bilateral inguinal hernias contain fat. A a 5.8 cm long axis fluid collection in the left groin has decreased in size slightly from 7.6 cm. No free fluid. Mesenteries and peritoneum are otherwise unremarkable. Musculoskeletal: No worrisome lytic or sclerotic lesions. Degenerative changes are seen in the spine. IMPRESSION: 1. Persistent abdominal/pelvic/inguinal adenopathy with slight increase in size of pelvic and inguinal lymph nodes. 2.  Coronary artery calcification. 3. Fluid collection in the left groin is decreased slightly in size and may represent a seroma. 4. Enlarged prostate. Electronically Signed   By: Lorin Picket M.D.   On: 06/26/2015 15:52   Ct Abdomen Pelvis W Contrast  06/26/2015  CLINICAL DATA:  Low-grade mantle cell lymphoma. EXAM: CT CHEST, ABDOMEN, AND PELVIS WITH CONTRAST TECHNIQUE: Multidetector CT imaging of the chest, abdomen and pelvis was performed following the standard protocol during bolus administration of intravenous contrast. CONTRAST:  170mL OMNIPAQUE IOHEXOL 300 MG/ML  SOLN COMPARISON:  02/27/2015. FINDINGS: CT CHEST FINDINGS Mediastinum/Nodes: Mediastinal and bi axillary lymph nodes are not enlarged by CT size criteria. No hilar adenopathy. Coronary artery calcification. Heart size normal. No pericardial effusion. Lungs/Pleura: 4 mm medial left upper lobe nodule) series 5, image 16), stable. Mild paraseptal emphysema. No pleural fluid. Airway is unremarkable. Musculoskeletal: No worrisome lytic or sclerotic lesions. Degenerative changes are seen in the spine. CT ABDOMEN AND PELVIS FINDINGS Hepatobiliary: Liver is unremarkable. Cholecystectomy. No biliary ductal dilatation. Pancreas: Negative. Spleen: Negative. Adrenals/Urinary Tract: Adrenal glands are unremarkable. Right nephrectomy. 8 mm low-attenuation lesion off the lower pole left kidney is too small to definitively characterize but statistically, a cyst is most likely. Left ureter is decompressed. Bladder is unremarkable. Stomach/Bowel: Stomach, small bowel and colon are unremarkable. Vascular/Lymphatic: Abdominal retroperitoneal lymph nodes measure up to 1.4 cm in short axis in the low left periaortic station (series 2, image 86), stable. Bilateral common iliac lymph nodes measure up to 1.6 cm on the left (image 99), previously 1.4 cm. Bilateral external iliac lymph nodes measure up to 2.0 cm on the left (image 108), previously 1.7 cm. Inguinal lymph  nodes measure up to 2.4 cm on the right (image 114), previously 1.7 cm. Reproductive: Prostate is mildly enlarged. Other: Small bilateral inguinal hernias contain fat. A a 5.8 cm long axis fluid collection in the left groin has decreased in size slightly from 7.6 cm. No free fluid. Mesenteries and peritoneum are otherwise unremarkable. Musculoskeletal: No worrisome lytic or sclerotic lesions. Degenerative changes are seen in the spine. IMPRESSION: 1. Persistent abdominal/pelvic/inguinal adenopathy with slight increase in size of pelvic and inguinal lymph nodes. 2. Coronary artery calcification. 3. Fluid collection in the left groin is decreased slightly in size and may represent a seroma. 4. Enlarged prostate. Electronically Signed   By: Lorin Picket M.D.   On: 06/26/2015 15:52  Assessment & Plan:   Problem List Items Addressed This Visit    Adjustment disorder with depressed mood    Was seeing psychiatry.  Feels stable.  Follow.        Diabetes ([place])    Followed by Dr Eddie Dibbles.  Had the low recently, but received extra insulin.  Has not been having as many problems with low sugars recently.  She just evaluated after the incident.  Follow.        Essential hypertension, benign - Primary    Blood pressure under good control.  Continue same medication regimen.  Follow pressures.  Follow metabolic panel.        GERD (gastroesophageal reflux disease)    EGD 01/08/14 as outlined in overview.  On carafate and protonix.  Doing well.       Hand laceration    Involves both hands.  Bactroban.  Dressing placed.  Discussed with him importance of changing dressing regularly and applying bactroban.  Schedule f/u in one week.  Any change or worsening symptoms, needs to be evaluated.        Hypercholesterolemia    Low cholesterol diet and exercise.  On pravastatin.  Follow lipid panel and liver function tests.        Lymphoma ([place])    Has mantle cell lymphoma.  Followed by Dr [name].  CT as  outlined.        Stress    Had been seeing psychiatry.  Son is back at home.  Feels he is handling things relatively well.  Follow.        Thyroid disease    Followed by Dr Eddie Dibbles.  On tapazole.           [name], MD

## 2015-12-16 ENCOUNTER — Encounter: Payer: Self-pay | Admitting: Internal Medicine

## 2015-12-16 DIAGNOSIS — S61419A Laceration without foreign body of unspecified hand, initial encounter: Secondary | ICD-10-CM | POA: Insufficient documentation

## 2015-12-16 NOTE — Assessment & Plan Note (Signed)
Followed by Dr Eddie Dibbles.  On tapazole.

## 2015-12-16 NOTE — Assessment & Plan Note (Signed)
Low cholesterol diet and exercise.  On pravastatin.  Follow lipid panel and liver function tests.   

## 2015-12-16 NOTE — Assessment & Plan Note (Signed)
Was seeing psychiatry.  Feels stable.  Follow.

## 2015-12-16 NOTE — Assessment & Plan Note (Signed)
Blood pressure under good control.  Continue same medication regimen.  Follow pressures.  Follow metabolic panel.   

## 2015-12-16 NOTE — Assessment & Plan Note (Signed)
Has mantle cell lymphoma.  Followed by Dr [name].  CT as outlined.

## 2015-12-16 NOTE — Assessment & Plan Note (Signed)
Followed by Dr Eddie Dibbles.  Had the low recently, but received extra insulin.  Has not been having as many problems with low sugars recently.  She just evaluated after the incident.  Follow.

## 2015-12-16 NOTE — Assessment & Plan Note (Signed)
Involves both hands.  Bactroban.  Dressing placed.  Discussed with him importance of changing dressing regularly and applying bactroban.  Schedule f/u in one week.  Any change or worsening symptoms, needs to be evaluated.

## 2015-12-16 NOTE — Assessment & Plan Note (Signed)
Had been seeing psychiatry.  Son is back at home.  Feels he is handling things relatively well.  Follow.

## 2015-12-16 NOTE — Assessment & Plan Note (Signed)
EGD 01/08/14 as outlined in overview.  On carafate and protonix.  Doing well.

## 2015-12-17 ENCOUNTER — Ambulatory Visit: Payer: Medicare Other | Admitting: Internal Medicine

## 2015-12-30 ENCOUNTER — Inpatient Hospital Stay: Payer: Medicare Other | Attending: Oncology

## 2015-12-30 ENCOUNTER — Ambulatory Visit
Admission: RE | Admit: 2015-12-30 | Discharge: 2015-12-30 | Disposition: A | Discharge status: Home / Self Care | Payer: Medicare Other | Source: Ambulatory Visit | Attending: Oncology | Admitting: Oncology

## 2015-12-30 DIAGNOSIS — Z9049 Acquired absence of other specified parts of digestive tract: Secondary | ICD-10-CM | POA: Diagnosis not present

## 2015-12-30 DIAGNOSIS — Z7982 Long term (current) use of aspirin: Secondary | ICD-10-CM | POA: Diagnosis not present

## 2015-12-30 DIAGNOSIS — I10 Essential (primary) hypertension: Secondary | ICD-10-CM | POA: Diagnosis not present

## 2015-12-30 DIAGNOSIS — E7800 Pure hypercholesterolemia, unspecified: Secondary | ICD-10-CM | POA: Diagnosis not present

## 2015-12-30 DIAGNOSIS — Z803 Family history of malignant neoplasm of breast: Secondary | ICD-10-CM | POA: Diagnosis not present

## 2015-12-30 DIAGNOSIS — I2510 Atherosclerotic heart disease of native coronary artery without angina pectoris: Secondary | ICD-10-CM | POA: Insufficient documentation

## 2015-12-30 DIAGNOSIS — E0500 Thyrotoxicosis with diffuse goiter without thyrotoxic crisis or storm: Secondary | ICD-10-CM | POA: Diagnosis not present

## 2015-12-30 DIAGNOSIS — R590 Localized enlarged lymph nodes: Secondary | ICD-10-CM | POA: Diagnosis not present

## 2015-12-30 DIAGNOSIS — M47814 Spondylosis without myelopathy or radiculopathy, thoracic region: Secondary | ICD-10-CM | POA: Diagnosis not present

## 2015-12-30 DIAGNOSIS — C8590 Non-Hodgkin lymphoma, unspecified, unspecified site: Secondary | ICD-10-CM | POA: Diagnosis not present

## 2015-12-30 DIAGNOSIS — Z79899 Other long term (current) drug therapy: Secondary | ICD-10-CM | POA: Diagnosis not present

## 2015-12-30 DIAGNOSIS — R918 Other nonspecific abnormal finding of lung field: Secondary | ICD-10-CM | POA: Diagnosis not present

## 2015-12-30 DIAGNOSIS — C8318 Mantle cell lymphoma, lymph nodes of multiple sites: Secondary | ICD-10-CM | POA: Diagnosis not present

## 2015-12-30 DIAGNOSIS — Z807 Family history of other malignant neoplasms of lymphoid, hematopoietic and related tissues: Secondary | ICD-10-CM | POA: Insufficient documentation

## 2015-12-30 DIAGNOSIS — Z9641 Presence of insulin pump (external) (internal): Secondary | ICD-10-CM | POA: Insufficient documentation

## 2015-12-30 DIAGNOSIS — C8310 Mantle cell lymphoma, unspecified site: Secondary | ICD-10-CM

## 2015-12-30 DIAGNOSIS — D696 Thrombocytopenia, unspecified: Secondary | ICD-10-CM | POA: Insufficient documentation

## 2015-12-30 DIAGNOSIS — E119 Type 2 diabetes mellitus without complications: Secondary | ICD-10-CM | POA: Insufficient documentation

## 2015-12-30 DIAGNOSIS — G629 Polyneuropathy, unspecified: Secondary | ICD-10-CM | POA: Diagnosis not present

## 2015-12-30 DIAGNOSIS — J439 Emphysema, unspecified: Secondary | ICD-10-CM | POA: Diagnosis not present

## 2015-12-30 DIAGNOSIS — Z87891 Personal history of nicotine dependence: Secondary | ICD-10-CM | POA: Insufficient documentation

## 2015-12-30 DIAGNOSIS — Z905 Acquired absence of kidney: Secondary | ICD-10-CM | POA: Diagnosis not present

## 2015-12-30 LAB — CBC WITH DIFFERENTIAL/PLATELET
Basophils Absolute: 0.1 10*3/uL (ref 0–0.1)
Basophils Relative: 1 %
EOS PCT: 2 %
Eosinophils Absolute: 0.1 10*3/uL (ref 0–0.7)
HCT: 42 % (ref 40.0–52.0)
Hemoglobin: 14.3 g/dL (ref 13.0–18.0)
LYMPHS ABS: 0.9 10*3/uL — AB (ref 1.0–3.6)
LYMPHS PCT: 15 %
MCH: 30.5 pg (ref 26.0–34.0)
MCHC: 34 g/dL (ref 32.0–36.0)
MCV: 89.9 fL (ref 80.0–100.0)
Monocytes Absolute: 0.3 10*3/uL (ref 0.2–1.0)
Monocytes Relative: 5 %
Neutro Abs: 4.5 10*3/uL (ref 1.4–6.5)
Neutrophils Relative %: 77 %
PLATELETS: 145 10*3/uL — AB (ref 150–440)
RBC: 4.67 MIL/uL (ref 4.40–5.90)
RDW: 15.2 % — ABNORMAL HIGH (ref 11.5–14.5)
WBC: 5.9 10*3/uL (ref 3.8–10.6)

## 2015-12-30 LAB — LACTATE DEHYDROGENASE: LDH: 161 U/L (ref 98–192)

## 2015-12-30 MED ORDER — IOHEXOL 300 MG/ML  SOLN
100.0000 mL | Freq: Once | INTRAMUSCULAR | Status: AC | PRN
  Administered 2015-12-30: 100 mL via INTRAVENOUS

## 2016-01-01 ENCOUNTER — Inpatient Hospital Stay (HOSPITAL_BASED_OUTPATIENT_CLINIC_OR_DEPARTMENT_OTHER): Payer: Medicare Other | Admitting: Oncology

## 2016-01-01 VITALS — BP 137/78 | HR 99 | Temp 97.0°F | Resp 20 | Ht 70.0 in | Wt 237.4 lb

## 2016-01-01 DIAGNOSIS — Z87891 Personal history of nicotine dependence: Secondary | ICD-10-CM

## 2016-01-01 DIAGNOSIS — G629 Polyneuropathy, unspecified: Secondary | ICD-10-CM | POA: Diagnosis not present

## 2016-01-01 DIAGNOSIS — I10 Essential (primary) hypertension: Secondary | ICD-10-CM | POA: Diagnosis not present

## 2016-01-01 DIAGNOSIS — C8318 Mantle cell lymphoma, lymph nodes of multiple sites: Secondary | ICD-10-CM | POA: Diagnosis not present

## 2016-01-01 DIAGNOSIS — Z7982 Long term (current) use of aspirin: Secondary | ICD-10-CM

## 2016-01-01 DIAGNOSIS — Z9641 Presence of insulin pump (external) (internal): Secondary | ICD-10-CM | POA: Diagnosis not present

## 2016-01-01 DIAGNOSIS — D696 Thrombocytopenia, unspecified: Secondary | ICD-10-CM | POA: Diagnosis not present

## 2016-01-01 DIAGNOSIS — Z905 Acquired absence of kidney: Secondary | ICD-10-CM

## 2016-01-01 DIAGNOSIS — E119 Type 2 diabetes mellitus without complications: Secondary | ICD-10-CM | POA: Diagnosis not present

## 2016-01-01 DIAGNOSIS — C8310 Mantle cell lymphoma, unspecified site: Secondary | ICD-10-CM

## 2016-01-01 DIAGNOSIS — E7800 Pure hypercholesterolemia, unspecified: Secondary | ICD-10-CM

## 2016-01-01 DIAGNOSIS — Z79899 Other long term (current) drug therapy: Secondary | ICD-10-CM

## 2016-01-01 DIAGNOSIS — I2510 Atherosclerotic heart disease of native coronary artery without angina pectoris: Secondary | ICD-10-CM

## 2016-01-01 NOTE — Progress Notes (Signed)
No changes last visit. 

## 2016-01-12 ENCOUNTER — Other Ambulatory Visit: Payer: Self-pay | Admitting: Internal Medicine

## 2016-01-13 NOTE — Progress Notes (Signed)
[place]  [phone] [phone]  ID: [name] OB: 1946-01-02  MR#: [id number]  UKG#:[id number]  Patient Care Team: [name], MD as PCP - General (Internal Medicine)  CHIEF COMPLAINT:  Chief Complaint  Patient presents with  . Follow-up    Mantle cell Lymphoma    INTERVAL HISTORY: Patient returns to clinic today for further evaluation and discussion of his imaging results. He continues to feel well and is asymptomatic. He denies any fevers, chills, or night sweats. He has a good appetite and denies weight loss. He has no neurologic complaints. He denies any chest pain or shortness of breath. He has no nausea, vomiting, constipation, or diarrhea. He has no urinary complaints. Patient offers no specific complaints today.  REVIEW OF SYSTEMS:   Review of Systems  Constitutional: Negative for fever, weight loss and malaise/fatigue.  Respiratory: Negative.   Cardiovascular: Negative.   Gastrointestinal: Negative.   Genitourinary: Negative.   Musculoskeletal: Negative.   Neurological: Negative.  Negative for weakness.    As per HPI. Otherwise, a complete review of systems is negatve.  PAST MEDICAL HISTORY: Past Medical History  Diagnosis Date  . Peripheral neuropathy ([place])   . Hypertension   . 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 Hypercholesterolemia   . Gastroparesis   . Graves disease   . Depression   . Mantle cell lymphoma ([place])     PAST SURGICAL HISTORY: Past Surgical History  Procedure Laterality Date  . Nephrectomy      right    FAMILY HISTORY Family History  Problem Relation Age of Onset  . Breast cancer Mother   . Diabetes Father   . Cancer Father     Lymphoma  . Alcohol abuse Maternal Uncle   . Diabetes Sister   . Heart disease      grandfather       ADVANCED DIRECTIVES:    HEALTH MAINTENANCE: Social History  Substance Use Topics  .  Smoking status: Former Research scientist (life sciences)  . Smokeless tobacco: Current User    Types: Chew  . Alcohol Use: 0.0 oz/week    0 Standard drinks or equivalent per week     Colonoscopy:  PAP:  Bone density:  Lipid panel:  Allergies  Allergen Reactions  . Rofecoxib Nausea Only    Current Outpatient Prescriptions  Medication Sig Dispense Refill  . aspirin EC 325 MG tablet Take by mouth.    Marland Kitchen FLUoxetine (PROZAC) 40 MG capsule Take 40 mg by mouth daily.  0  . fluticasone (FLOVENT HFA) 110 MCG/ACT inhaler Inhale 2 puffs into the lungs 2 (two) times daily. 3 Inhaler 1  . insulin aspart (NOVOLOG) 100 UNIT/ML injection Use as directed 1 vial 12  . Insulin Syringe-Needle U-100 (INSULIN SYRINGE .5CC/31GX5/16") 31G X 5/16" 0.5 ML MISC     . lisinopril (PRINIVIL,ZESTRIL) 20 MG tablet TAKE 1 TABLET BY MOUTH AT BEDTIME 90 tablet 1  . methimazole (TAPAZOLE) 10 MG tablet Take 20 mg by mouth 2 (two) times daily.    . metoCLOPramide (REGLAN) 10 MG tablet TAKE 1 TABLET (10 MG TOTAL) BY MOUTH AT BEDTIME. 90 tablet 1  . mupirocin ointment (BACTROBAN) 2 % Apply to affected area bid 22 g 0  . pantoprazole (PROTONIX) 40 MG tablet TAKE 1 TABLET (40 MG TOTAL) BY MOUTH DAILY. 90 tablet 1  . sucralfate (CARAFATE) 1 G tablet TAKE 1 TABLET BY MOUTH 4 TIMES A DAY BEFORE MEALS AND AT  BEDTIME FOR 30 DAYS 120 tablet 0  . traZODone (DESYREL) 50 MG tablet Take 50 mg by mouth daily as needed.    . pravastatin (PRAVACHOL) 40 MG tablet TAKE 1 TABLET BY MOUTH AT BEDTIME 90 tablet 3   No current facility-administered medications for this visit.    OBJECTIVE: Filed Vitals:   01/01/16 1020  BP: 137/78  Pulse: 99  Temp: 97 F (36.1 C)  Resp: 20     Body mass index is 34.07 kg/(m^2).    ECOG FS:0 - Asymptomatic  General: Well-developed, well-nourished, no acute distress. Eyes: Pink conjunctiva, anicteric sclera. Lungs: Clear to auscultation bilaterally. Heart: Regular rate and rhythm. No rubs, murmurs, or gallops. Abdomen:  Soft, nontender, nondistended. No organomegaly noted, normoactive bowel sounds. Musculoskeletal: No edema, cyanosis, or clubbing. Neuro: Alert, answering all questions appropriately. Cranial nerves grossly intact. Skin: No rashes or petechiae noted. Psych: Normal affect. Lymphatics: No cervical, calvicular, axillary or inguinal LAD.   LAB RESULTS:  Lab Results  Component Value Date   NA 140 08/09/2015   K 4.4 08/09/2015   CL 104 08/09/2015   CO2 29 08/09/2015   GLUCOSE 204* 08/09/2015   BUN 12 08/09/2015   CREATININE 0.96 08/09/2015   CALCIUM 8.4 08/09/2015   PROT 6.7 08/09/2015   ALBUMIN 3.4* 08/09/2015   AST 19 08/09/2015   ALT 8 08/09/2015   ALKPHOS 79 08/09/2015   BILITOT 0.8 08/09/2015   GFRNONAA >60 05/08/2015   GFRAA >60 05/08/2015    Lab Results  Component Value Date   WBC 5.9 12/30/2015   NEUTROABS 4.5 12/30/2015   HGB 14.3 12/30/2015   HCT 42.0 12/30/2015   MCV 89.9 12/30/2015   PLT 145* 12/30/2015     STUDIES: Ct Chest W Contrast  12/30/2015  CLINICAL DATA:  Restaging it'll cell lymphoma. Right nephrectomy as a child. EXAM: CT CHEST, ABDOMEN, AND PELVIS WITH CONTRAST TECHNIQUE: Multidetector CT imaging of the chest, abdomen and pelvis was performed following the standard protocol during bolus administration of intravenous contrast. CONTRAST:  142m OMNIPAQUE IOHEXOL 300 MG/ML  SOLN COMPARISON:  Multiple exams, including 06/26/2015 FINDINGS: CT CHEST FINDINGS Mediastinum/Nodes: No pathologic adenopathy in the chest. Old granulomatous disease noted. contrast medium in the esophagus, possibly from reflux or dysmotility. Coronary artery atherosclerotic calcification. Stable lower axillary nodule 0.8 cm in short axis on image 34/2, stable. Lungs/Pleura: Paraseptal emphysema. Stable 4 mm left upper lobe nodule, image 16 series 5, no change from earliest available comparison of 06/28/2014. Indistinct 4 mm region of nodularity in the right upper lobe, image 21 series 5,  no change from earliest available comparison of 06/28/2014. Musculoskeletal: Thoracic spondylosis. CT ABDOMEN PELVIS FINDINGS Hepatobiliary: Cholecystectomy. Pancreas: Unremarkable Spleen: Unremarkable, no splenomegaly. Adrenals/Urinary Tract: Adrenal glands normal. Right nephrectomy. Stable hypodense 8 mm partially exophytic lesion from the left kidney lower pole. Thick-walled urinary bladder, also nondistended. Stomach/Bowel: Unremarkable Vascular/Lymphatic: Mild aortoiliac atherosclerotic vascular calcification. Retroperitoneal and pelvic adenopathy, index left periaortic node 1.4 cm in short axis on image 86/2 (stable), index left common iliac node 2.0 cm in short axis on image 92/2 (formerly 1.6 cm), index left external iliac node 2.2 cm in short axis on image 107/2 (formerly 2.0 cm), index right inguinal lymph node 2.2 cm in short axis on image 114/2 (formerly 2.1 cm by my measurement). Previously cystic collection along the left inguinal region measures 2.4 cm transverse, formerly 5.8 cm transverse. Subjectively the lymph nodes appear mildly enlarged compared to prior. Reproductive: Unremarkable aside from calcification of the vasa  deferentia. Other: A retroperitoneal nodule on image 90 series 2 measures 1.1 by 1.1 cm, formerly 1.1 by 1.0 cm. Musculoskeletal: Degenerative arthropathy of both hips. IMPRESSION: 1. Minimal increase in the size of lower retroperitoneal and pelvic adenopathy compared to prior. However, the cystic collection in the left groin is reduced in size. 2. Contrast medium in the esophagus, possibly from reflux or dysmotility. 3. Coronary atherosclerosis. 4. Paraseptal emphysema. 5. 4 mm nodules in each upper lobe, unchanged from earliest available comparison of 06/28/2014, may merit observation. 6. Thick-walled urinary bladder suggesting cystitis. Electronically Signed   By: Van Clines M.D.   On: 12/30/2015 11:30   Ct Abdomen Pelvis W Contrast  12/30/2015  CLINICAL DATA:   Restaging it'll cell lymphoma. Right nephrectomy as a child. EXAM: CT CHEST, ABDOMEN, AND PELVIS WITH CONTRAST TECHNIQUE: Multidetector CT imaging of the chest, abdomen and pelvis was performed following the standard protocol during bolus administration of intravenous contrast. CONTRAST:  113m OMNIPAQUE IOHEXOL 300 MG/ML  SOLN COMPARISON:  Multiple exams, including 06/26/2015 FINDINGS: CT CHEST FINDINGS Mediastinum/Nodes: No pathologic adenopathy in the chest. Old granulomatous disease noted. contrast medium in the esophagus, possibly from reflux or dysmotility. Coronary artery atherosclerotic calcification. Stable lower axillary nodule 0.8 cm in short axis on image 34/2, stable. Lungs/Pleura: Paraseptal emphysema. Stable 4 mm left upper lobe nodule, image 16 series 5, no change from earliest available comparison of 06/28/2014. Indistinct 4 mm region of nodularity in the right upper lobe, image 21 series 5, no change from earliest available comparison of 06/28/2014. Musculoskeletal: Thoracic spondylosis. CT ABDOMEN PELVIS FINDINGS Hepatobiliary: Cholecystectomy. Pancreas: Unremarkable Spleen: Unremarkable, no splenomegaly. Adrenals/Urinary Tract: Adrenal glands normal. Right nephrectomy. Stable hypodense 8 mm partially exophytic lesion from the left kidney lower pole. Thick-walled urinary bladder, also nondistended. Stomach/Bowel: Unremarkable Vascular/Lymphatic: Mild aortoiliac atherosclerotic vascular calcification. Retroperitoneal and pelvic adenopathy, index left periaortic node 1.4 cm in short axis on image 86/2 (stable), index left common iliac node 2.0 cm in short axis on image 92/2 (formerly 1.6 cm), index left external iliac node 2.2 cm in short axis on image 107/2 (formerly 2.0 cm), index right inguinal lymph node 2.2 cm in short axis on image 114/2 (formerly 2.1 cm by my measurement). Previously cystic collection along the left inguinal region measures 2.4 cm transverse, formerly 5.8 cm transverse.  Subjectively the lymph nodes appear mildly enlarged compared to prior. Reproductive: Unremarkable aside from calcification of the vasa deferentia. Other: A retroperitoneal nodule on image 90 series 2 measures 1.1 by 1.1 cm, formerly 1.1 by 1.0 cm. Musculoskeletal: Degenerative arthropathy of both hips. IMPRESSION: 1. Minimal increase in the size of lower retroperitoneal and pelvic adenopathy compared to prior. However, the cystic collection in the left groin is reduced in size. 2. Contrast medium in the esophagus, possibly from reflux or dysmotility. 3. Coronary atherosclerosis. 4. Paraseptal emphysema. 5. 4 mm nodules in each upper lobe, unchanged from earliest available comparison of 06/28/2014, may merit observation. 6. Thick-walled urinary bladder suggesting cystitis. Electronically Signed   By: WVan ClinesM.D.   On: 12/30/2015 11:30    ASSESSMENT: Low-grade mantle cell lymphoma diagnosed in September 2015.  PLAN:    1. Mantle cell lymphoma: CT scans reviewed independently and reported as above with persistent and minimal increase in size of adenopathy consistent with mantle cell lymphoma. No intervention is needed at this time. Patient has not required treatment at this point, but if he had progressive disease would consider Rituxan plus Treanda. No bone marrow biopsy has been performed.  This was previously discussed and determined it would not change overall treatment or prognosis. Return to clinic in 6 months with repeat imaging and further evaluation. 2. Decreased ejection fraction: Previously patient had an ejection fraction of 40% with no clinical CHF. Monitor. 3. Diabetes: Continue insulin pump as directed. 4. Thrombocytopenia: Mild, monitor.  Patient expressed understanding and was in agreement with this plan. He also understands that He can call clinic at any time with any questions, concerns, or complaints.    Lloyd Huger, MD   01/13/2016 4:35 PM

## 2016-01-23 ENCOUNTER — Telehealth: Payer: Self-pay | Admitting: Internal Medicine

## 2016-01-23 NOTE — Telephone Encounter (Signed)
Left msg to call office to schedule AWV with Denisa/msn

## 2016-01-27 NOTE — Telephone Encounter (Signed)
Left msg to call office to schedule AWV with Denisa/msn

## 2016-03-06 DIAGNOSIS — E0500 Thyrotoxicosis with diffuse goiter without thyrotoxic crisis or storm: Secondary | ICD-10-CM | POA: Diagnosis not present

## 2016-03-06 DIAGNOSIS — E10649 Type 1 diabetes mellitus with hypoglycemia without coma: Secondary | ICD-10-CM | POA: Diagnosis not present

## 2016-03-10 DIAGNOSIS — E032 Hypothyroidism due to medicaments and other exogenous substances: Secondary | ICD-10-CM | POA: Diagnosis not present

## 2016-03-10 DIAGNOSIS — E785 Hyperlipidemia, unspecified: Secondary | ICD-10-CM | POA: Diagnosis not present

## 2016-03-10 DIAGNOSIS — E1042 Type 1 diabetes mellitus with diabetic polyneuropathy: Secondary | ICD-10-CM | POA: Diagnosis not present

## 2016-03-10 DIAGNOSIS — E0500 Thyrotoxicosis with diffuse goiter without thyrotoxic crisis or storm: Secondary | ICD-10-CM | POA: Diagnosis not present

## 2016-03-10 DIAGNOSIS — E10649 Type 1 diabetes mellitus with hypoglycemia without coma: Secondary | ICD-10-CM | POA: Diagnosis not present

## 2016-03-10 DIAGNOSIS — E1043 Type 1 diabetes mellitus with diabetic autonomic (poly)neuropathy: Secondary | ICD-10-CM | POA: Diagnosis not present

## 2016-03-10 DIAGNOSIS — K319 Disease of stomach and duodenum, unspecified: Secondary | ICD-10-CM | POA: Diagnosis not present

## 2016-03-10 DIAGNOSIS — Z4681 Encounter for fitting and adjustment of insulin pump: Secondary | ICD-10-CM | POA: Diagnosis not present

## 2016-05-29 DIAGNOSIS — I10 Essential (primary) hypertension: Secondary | ICD-10-CM | POA: Diagnosis not present

## 2016-05-29 DIAGNOSIS — I2510 Atherosclerotic heart disease of native coronary artery without angina pectoris: Secondary | ICD-10-CM | POA: Diagnosis not present

## 2016-05-29 DIAGNOSIS — E10649 Type 1 diabetes mellitus with hypoglycemia without coma: Secondary | ICD-10-CM | POA: Diagnosis not present

## 2016-05-29 DIAGNOSIS — R0602 Shortness of breath: Secondary | ICD-10-CM | POA: Diagnosis not present

## 2016-06-09 DIAGNOSIS — E10649 Type 1 diabetes mellitus with hypoglycemia without coma: Secondary | ICD-10-CM | POA: Diagnosis not present

## 2016-06-09 DIAGNOSIS — E0500 Thyrotoxicosis with diffuse goiter without thyrotoxic crisis or storm: Secondary | ICD-10-CM | POA: Diagnosis not present

## 2016-06-16 DIAGNOSIS — E0500 Thyrotoxicosis with diffuse goiter without thyrotoxic crisis or storm: Secondary | ICD-10-CM | POA: Diagnosis not present

## 2016-06-16 DIAGNOSIS — E1043 Type 1 diabetes mellitus with diabetic autonomic (poly)neuropathy: Secondary | ICD-10-CM | POA: Diagnosis not present

## 2016-06-16 DIAGNOSIS — E10649 Type 1 diabetes mellitus with hypoglycemia without coma: Secondary | ICD-10-CM | POA: Diagnosis not present

## 2016-06-16 DIAGNOSIS — Z4681 Encounter for fitting and adjustment of insulin pump: Secondary | ICD-10-CM | POA: Diagnosis not present

## 2016-06-16 DIAGNOSIS — K319 Disease of stomach and duodenum, unspecified: Secondary | ICD-10-CM | POA: Diagnosis not present

## 2016-06-29 DIAGNOSIS — I6523 Occlusion and stenosis of bilateral carotid arteries: Secondary | ICD-10-CM | POA: Insufficient documentation

## 2016-06-29 DIAGNOSIS — I428 Other cardiomyopathies: Secondary | ICD-10-CM | POA: Diagnosis not present

## 2016-06-29 DIAGNOSIS — I6529 Occlusion and stenosis of unspecified carotid artery: Secondary | ICD-10-CM | POA: Diagnosis not present

## 2016-06-29 DIAGNOSIS — I2510 Atherosclerotic heart disease of native coronary artery without angina pectoris: Secondary | ICD-10-CM | POA: Diagnosis not present

## 2016-06-29 DIAGNOSIS — R0602 Shortness of breath: Secondary | ICD-10-CM | POA: Diagnosis not present

## 2016-06-29 DIAGNOSIS — I10 Essential (primary) hypertension: Secondary | ICD-10-CM | POA: Diagnosis not present

## 2016-07-06 ENCOUNTER — Inpatient Hospital Stay: Payer: Medicare Other | Attending: Oncology

## 2016-07-06 ENCOUNTER — Ambulatory Visit
Admission: RE | Admit: 2016-07-06 | Discharge: 2016-07-06 | Disposition: A | Discharge status: Home / Self Care | Payer: Medicare Other | Source: Ambulatory Visit | Attending: Oncology | Admitting: Oncology

## 2016-07-06 ENCOUNTER — Encounter (INDEPENDENT_AMBULATORY_CARE_PROVIDER_SITE_OTHER): Payer: Self-pay

## 2016-07-06 DIAGNOSIS — E7800 Pure hypercholesterolemia, unspecified: Secondary | ICD-10-CM | POA: Insufficient documentation

## 2016-07-06 DIAGNOSIS — C8590 Non-Hodgkin lymphoma, unspecified, unspecified site: Secondary | ICD-10-CM | POA: Diagnosis not present

## 2016-07-06 DIAGNOSIS — E1143 Type 2 diabetes mellitus with diabetic autonomic (poly)neuropathy: Secondary | ICD-10-CM | POA: Diagnosis not present

## 2016-07-06 DIAGNOSIS — Z79899 Other long term (current) drug therapy: Secondary | ICD-10-CM | POA: Insufficient documentation

## 2016-07-06 DIAGNOSIS — I10 Essential (primary) hypertension: Secondary | ICD-10-CM | POA: Diagnosis not present

## 2016-07-06 DIAGNOSIS — Z9641 Presence of insulin pump (external) (internal): Secondary | ICD-10-CM | POA: Insufficient documentation

## 2016-07-06 DIAGNOSIS — E0500 Thyrotoxicosis with diffuse goiter without thyrotoxic crisis or storm: Secondary | ICD-10-CM | POA: Insufficient documentation

## 2016-07-06 DIAGNOSIS — C8318 Mantle cell lymphoma, lymph nodes of multiple sites: Secondary | ICD-10-CM | POA: Insufficient documentation

## 2016-07-06 DIAGNOSIS — C8310 Mantle cell lymphoma, unspecified site: Secondary | ICD-10-CM | POA: Diagnosis not present

## 2016-07-06 DIAGNOSIS — Z905 Acquired absence of kidney: Secondary | ICD-10-CM | POA: Insufficient documentation

## 2016-07-06 DIAGNOSIS — R590 Localized enlarged lymph nodes: Secondary | ICD-10-CM | POA: Diagnosis not present

## 2016-07-06 DIAGNOSIS — E11311 Type 2 diabetes mellitus with unspecified diabetic retinopathy with macular edema: Secondary | ICD-10-CM | POA: Diagnosis not present

## 2016-07-06 DIAGNOSIS — R9341 Abnormal radiologic findings on diagnostic imaging of renal pelvis, ureter, or bladder: Secondary | ICD-10-CM | POA: Diagnosis not present

## 2016-07-06 DIAGNOSIS — D696 Thrombocytopenia, unspecified: Secondary | ICD-10-CM | POA: Diagnosis not present

## 2016-07-06 DIAGNOSIS — Z803 Family history of malignant neoplasm of breast: Secondary | ICD-10-CM | POA: Diagnosis not present

## 2016-07-06 DIAGNOSIS — G629 Polyneuropathy, unspecified: Secondary | ICD-10-CM | POA: Diagnosis not present

## 2016-07-06 DIAGNOSIS — Z7982 Long term (current) use of aspirin: Secondary | ICD-10-CM | POA: Diagnosis not present

## 2016-07-06 DIAGNOSIS — Z87891 Personal history of nicotine dependence: Secondary | ICD-10-CM | POA: Diagnosis not present

## 2016-07-06 LAB — POCT I-STAT CREATININE: CREATININE: 0.7 mg/dL (ref 0.61–1.24)

## 2016-07-06 LAB — CBC WITH DIFFERENTIAL/PLATELET
BASOS ABS: 0 10*3/uL (ref 0–0.1)
BASOS PCT: 1 %
EOS ABS: 0.1 10*3/uL (ref 0–0.7)
EOS PCT: 3 %
HEMATOCRIT: 34.6 % — AB (ref 40.0–52.0)
Hemoglobin: 12.2 g/dL — ABNORMAL LOW (ref 13.0–18.0)
Lymphocytes Relative: 16 %
Lymphs Abs: 0.7 10*3/uL — ABNORMAL LOW (ref 1.0–3.6)
MCH: 39.2 pg — ABNORMAL HIGH (ref 26.0–34.0)
MCHC: 35.3 g/dL (ref 32.0–36.0)
MCV: 111 fL — ABNORMAL HIGH (ref 80.0–100.0)
MONO ABS: 0.2 10*3/uL (ref 0.2–1.0)
MONOS PCT: 5 %
NEUTROS ABS: 3.1 10*3/uL (ref 1.4–6.5)
Neutrophils Relative %: 75 %
PLATELETS: 132 10*3/uL — AB (ref 150–440)
RBC: 3.12 MIL/uL — ABNORMAL LOW (ref 4.40–5.90)
RDW: 16.4 % — AB (ref 11.5–14.5)
WBC: 4.2 10*3/uL (ref 3.8–10.6)

## 2016-07-06 LAB — LACTATE DEHYDROGENASE: LDH: 257 U/L — AB (ref 98–192)

## 2016-07-06 MED ORDER — IOPAMIDOL (ISOVUE-300) INJECTION 61%
100.0000 mL | Freq: Once | INTRAVENOUS | Status: AC | PRN
  Administered 2016-07-06: 100 mL via INTRAVENOUS

## 2016-07-07 NOTE — Progress Notes (Signed)
[place]  [phone] [phone]  ID: Gary [name] OB: 06-27-1946  MR#: [id number]  BWG#:[id number]  Patient Care Team: [name], MD as PCP - General (Internal Medicine)  CHIEF COMPLAINT: Mantle cell lymphoma.  INTERVAL HISTORY: Patient returns to clinic today for further evaluation and discussion of his imaging results. He continues to feel well and is asymptomatic. He denies any fevers, chills, or night sweats. He has a good appetite and denies weight loss. He has no neurologic complaints. He denies any chest pain or shortness of breath. He has no nausea, vomiting, constipation, or diarrhea. He has no urinary complaints. Patient offers no specific complaints today.  REVIEW OF SYSTEMS:   Review of Systems  Constitutional: Negative for diaphoresis, fever, malaise/fatigue and weight loss.  Respiratory: Negative.  Negative for cough.   Cardiovascular: Negative.  Negative for chest pain and leg swelling.  Gastrointestinal: Negative.  Negative for abdominal pain.  Genitourinary: Negative.   Musculoskeletal: Negative.   Neurological: Negative.  Negative for sensory change and weakness.  Psychiatric/Behavioral: Negative.     As per HPI. Otherwise, a complete review of systems is negative.  PAST MEDICAL HISTORY: Past Medical History:  Diagnosis Date  . Depression   . 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 Gastroparesis   . Graves disease   . Hypercholesterolemia   . Hypertension   . Mantle cell lymphoma ([place])   . Peripheral neuropathy ([place])     PAST SURGICAL HISTORY: Past Surgical History:  Procedure Laterality Date  . NEPHRECTOMY     right    FAMILY HISTORY Family History  Problem Relation Age of Onset  . Breast cancer Mother   . Diabetes Father   . Cancer Father     Lymphoma  . Alcohol abuse Maternal Uncle   . Diabetes Sister   . Heart disease      grandfather        ADVANCED DIRECTIVES:    HEALTH MAINTENANCE: Social History  Substance Use Topics  . Smoking status: Former Research scientist (life sciences)  . Smokeless tobacco: Current User    Types: Chew  . Alcohol use 0.0 oz/week     Colonoscopy:  PAP:  Bone density:  Lipid panel:  Allergies  Allergen Reactions  . Rofecoxib Nausea Only    Current Outpatient Prescriptions  Medication Sig Dispense Refill  . aspirin EC 325 MG tablet Take by mouth.    Marland Kitchen FLUoxetine (PROZAC) 40 MG capsule Take 40 mg by mouth daily.  0  . fluticasone (FLOVENT HFA) 110 MCG/ACT inhaler Inhale 2 puffs into the lungs 2 (two) times daily. 3 Inhaler 1  . insulin aspart (NOVOLOG) 100 UNIT/ML injection Use as directed 1 vial 12  . lisinopril (PRINIVIL,ZESTRIL) 20 MG tablet TAKE 1 TABLET BY MOUTH AT BEDTIME 90 tablet 1  . methimazole (TAPAZOLE) 10 MG tablet Take 20 mg by mouth 2 (two) times daily.    . metoCLOPramide (REGLAN) 10 MG tablet TAKE 1 TABLET (10 MG TOTAL) BY MOUTH AT BEDTIME. 90 tablet 1  . mupirocin ointment (BACTROBAN) 2 % Apply to affected area bid 22 g 0  . pantoprazole (PROTONIX) 40 MG tablet TAKE 1 TABLET (40 MG TOTAL) BY MOUTH DAILY. 90 tablet 1  . pravastatin (PRAVACHOL) 40 MG tablet TAKE 1 TABLET BY MOUTH AT BEDTIME 90 tablet 3  . sucralfate (CARAFATE) 1 G tablet TAKE 1 TABLET BY MOUTH 4 TIMES A DAY BEFORE MEALS AND AT BEDTIME FOR 30  DAYS 120 tablet 0  . traZODone (DESYREL) 50 MG tablet Take 50 mg by mouth daily as needed.     No current facility-administered medications for this visit.     OBJECTIVE: Vitals:   07/08/16 1054  BP: (!) 147/80  Pulse: (!) 105  Resp: 18  Temp: 99.3 F (37.4 C)     Body mass index is 34.61 kg/m.    ECOG FS:0 - Asymptomatic  General: Well-developed, well-nourished, no acute distress. Eyes: Pink conjunctiva, anicteric sclera. Lungs: Clear to auscultation bilaterally. Heart: Regular rate and rhythm. No rubs, murmurs, or gallops. Abdomen: Soft, nontender, nondistended. No  organomegaly noted, normoactive bowel sounds. Musculoskeletal: No edema, cyanosis, or clubbing. Neuro: Alert, answering all questions appropriately. Cranial nerves grossly intact. Skin: No rashes or petechiae noted. Psych: Normal affect. Lymphatics: No cervical, calvicular, axillary or inguinal LAD.   LAB RESULTS:  Lab Results  Component Value Date   NA 140 08/09/2015   K 4.4 08/09/2015   CL 104 08/09/2015   CO2 29 08/09/2015   GLUCOSE 204 (H) 08/09/2015   BUN 12 08/09/2015   CREATININE 0.70 07/06/2016   CALCIUM 8.4 08/09/2015   PROT 6.7 08/09/2015   ALBUMIN 3.4 (L) 08/09/2015   AST 19 08/09/2015   ALT 8 08/09/2015   ALKPHOS 79 08/09/2015   BILITOT 0.8 08/09/2015   GFRNONAA >60 05/08/2015   GFRAA >60 05/08/2015    Lab Results  Component Value Date   WBC 4.2 07/06/2016   NEUTROABS 3.1 07/06/2016   HGB 12.2 (L) 07/06/2016   HCT 34.6 (L) 07/06/2016   MCV 111.0 (H) 07/06/2016   PLT 132 (L) 07/06/2016     STUDIES: Ct Chest W Contrast  Result Date: 07/06/2016 CLINICAL DATA:  Restaging lymphoma. EXAM: CT CHEST, ABDOMEN, AND PELVIS WITH CONTRAST TECHNIQUE: Multidetector CT imaging of the chest, abdomen and pelvis was performed following the standard protocol during bolus administration of intravenous contrast. CONTRAST:  143m ISOVUE-300 IOPAMIDOL (ISOVUE-300) INJECTION 61% COMPARISON:  CT scan 06/26/2015 FINDINGS: CT CHEST FINDINGS Chest wall: No chest wall mass. There are numerous small axillary lymph nodes bilaterally which are slightly larger. Right axillary node on image 18 has a maximum short axis diameter of 9 mm and was previously 4.5 mm. Left axillary lymph node on image 17 measures 9 mm and previously measured 8 mm. Subpectoral lymph node on the left side on image number 11 measures 7.5 mm and previously measured 7 mm. Right lower axillary lymph node on image number 20 measures 8 mm and previously measured 4.5 mm. Cardiovascular: The heart is normal in size. No  pericardial effusion. The aorta is normal in caliber. No dissection. The branch vessels are patent. Stable coronary artery calcifications. The pulmonary arteries appear normal. Mediastinum/Nodes: Small scattered mediastinal and hilar lymph nodes are unchanged. 4.5 mm pretracheal lymph node on image 21 is stable. 8 mm subcarinal lymph node on image 30 is also stable. Small hilar lymph nodes are unchanged. The esophagus is grossly normal and stable. Lungs/Pleura: No acute pulmonary findings. Mild stable emphysematous changes. No worrisome pulmonary lesions or acute pulmonary infiltrates. No pleural effusion. Musculoskeletal: No significant bony findings. CT ABDOMEN PELVIS FINDINGS Hepatobiliary: No focal hepatic lesions or intrahepatic biliary dilatation. The gallbladder is surgically absent. No common bile duct dilatation. Pancreas: No mass, inflammation or ductal dilatation. Spleen: Normal size.  No focal lesions. Adrenals/Urinary Tract: The adrenal glands are normal in stable. Status post right nephrectomy. The left kidney is unremarkable and unchanged. Stable lower pole cyst. Stomach/Bowel: The  stomach, duodenum, small bowel and colon are unremarkable. No inflammatory changes, mass lesions or obstructive findings. Vascular/Lymphatic: The aorta is normal in caliber. The branch vessels are patent. The major venous structures are patent. Interval slight progression of retroperitoneal lymphadenopathy. The largest nodal lesion is adjacent to the left iliac artery and on image 91 measures 30 x 25 mm. This previously measured 22 x 17 mm. Left periaortic lymph node on image number 84 measures 15 mm and previously measured 14.5 mm. Cluster of small nodes around the IVC on image number 88 are stable. Lymph node posterior to the right psoas muscle on image number 98 measures 22 x 18.5 mm and previously measured 18 x 14 mm. Left common iliac lymph node on image 90 measures 33 x 20 mm and previously measured 20 x 15.5 mm. Left  external iliac lymph node on image 106 measures 26 x 24 mm and previously measured 21 x 20 mm. Largest right inguinal node on image number 112 measures 28 x 20 mm and previously measured 24 x 24 mm. The large cystic area in the left inguinal area on the prior study has resolved. Reproductive: The prostate gland and seminal vesicles are normal. Other: No ascites or abdominal wall hernia. Musculoskeletal: No significant bony findings. Stable degenerative changes involving the spine and hips. IMPRESSION: 1. Slight interval progression of lymphadenopathy in the abdomen/pelvis. Most of the lymph nodes in the axilla and chest are stable. 2. No acute abdominal/pelvic findings, mass lesions or adenopathy. 3. Thick walled bladder likely due to lack of distension. 4. No splenomegaly.  Status post right nephrectomy. Electronically Signed   By: Marijo Sanes M.D.   On: 07/06/2016 14:07   Ct Abdomen Pelvis W Contrast  Result Date: 07/06/2016 CLINICAL DATA:  Restaging lymphoma. EXAM: CT CHEST, ABDOMEN, AND PELVIS WITH CONTRAST TECHNIQUE: Multidetector CT imaging of the chest, abdomen and pelvis was performed following the standard protocol during bolus administration of intravenous contrast. CONTRAST:  144m ISOVUE-300 IOPAMIDOL (ISOVUE-300) INJECTION 61% COMPARISON:  CT scan 06/26/2015 FINDINGS: CT CHEST FINDINGS Chest wall: No chest wall mass. There are numerous small axillary lymph nodes bilaterally which are slightly larger. Right axillary node on image 18 has a maximum short axis diameter of 9 mm and was previously 4.5 mm. Left axillary lymph node on image 17 measures 9 mm and previously measured 8 mm. Subpectoral lymph node on the left side on image number 11 measures 7.5 mm and previously measured 7 mm. Right lower axillary lymph node on image number 20 measures 8 mm and previously measured 4.5 mm. Cardiovascular: The heart is normal in size. No pericardial effusion. The aorta is normal in caliber. No dissection. The  branch vessels are patent. Stable coronary artery calcifications. The pulmonary arteries appear normal. Mediastinum/Nodes: Small scattered mediastinal and hilar lymph nodes are unchanged. 4.5 mm pretracheal lymph node on image 21 is stable. 8 mm subcarinal lymph node on image 30 is also stable. Small hilar lymph nodes are unchanged. The esophagus is grossly normal and stable. Lungs/Pleura: No acute pulmonary findings. Mild stable emphysematous changes. No worrisome pulmonary lesions or acute pulmonary infiltrates. No pleural effusion. Musculoskeletal: No significant bony findings. CT ABDOMEN PELVIS FINDINGS Hepatobiliary: No focal hepatic lesions or intrahepatic biliary dilatation. The gallbladder is surgically absent. No common bile duct dilatation. Pancreas: No mass, inflammation or ductal dilatation. Spleen: Normal size.  No focal lesions. Adrenals/Urinary Tract: The adrenal glands are normal in stable. Status post right nephrectomy. The left kidney is unremarkable and unchanged. Stable  lower pole cyst. Stomach/Bowel: The stomach, duodenum, small bowel and colon are unremarkable. No inflammatory changes, mass lesions or obstructive findings. Vascular/Lymphatic: The aorta is normal in caliber. The branch vessels are patent. The major venous structures are patent. Interval slight progression of retroperitoneal lymphadenopathy. The largest nodal lesion is adjacent to the left iliac artery and on image 91 measures 30 x 25 mm. This previously measured 22 x 17 mm. Left periaortic lymph node on image number 84 measures 15 mm and previously measured 14.5 mm. Cluster of small nodes around the IVC on image number 88 are stable. Lymph node posterior to the right psoas muscle on image number 98 measures 22 x 18.5 mm and previously measured 18 x 14 mm. Left common iliac lymph node on image 90 measures 33 x 20 mm and previously measured 20 x 15.5 mm. Left external iliac lymph node on image 106 measures 26 x 24 mm and  previously measured 21 x 20 mm. Largest right inguinal node on image number 112 measures 28 x 20 mm and previously measured 24 x 24 mm. The large cystic area in the left inguinal area on the prior study has resolved. Reproductive: The prostate gland and seminal vesicles are normal. Other: No ascites or abdominal wall hernia. Musculoskeletal: No significant bony findings. Stable degenerative changes involving the spine and hips. IMPRESSION: 1. Slight interval progression of lymphadenopathy in the abdomen/pelvis. Most of the lymph nodes in the axilla and chest are stable. 2. No acute abdominal/pelvic findings, mass lesions or adenopathy. 3. Thick walled bladder likely due to lack of distension. 4. No splenomegaly.  Status post right nephrectomy. Electronically Signed   By: Marijo Sanes M.D.   On: 07/06/2016 14:07    ASSESSMENT: Low-grade mantle cell lymphoma diagnosed in September 2015.  PLAN:    1. Mantle cell lymphoma: CT scans reviewed independently and reported as above with persistent and slight interval progression of known adenopathy consistent with mantle cell lymphoma. No intervention is needed at this time. Patient has not required treatment at this point, but if he had progressive disease would consider Rituxan plus Treanda. No bone marrow biopsy has been performed. This was previously discussed and determined it would not change overall treatment or prognosis. Return to clinic in 6 months with repeat imaging and further evaluation. 2. Decreased ejection fraction: Previously patient had an ejection fraction of 40% with no clinical CHF. Monitor. 3. Diabetes: Continue insulin pump as directed. 4. Thrombocytopenia: Mild, monitor.  Patient expressed understanding and was in agreement with this plan. He also understands that He can call clinic at any time with any questions, concerns, or complaints.    Lloyd Huger, MD   07/10/2016 6:49 PM

## 2016-07-08 ENCOUNTER — Other Ambulatory Visit: Payer: Medicare Other

## 2016-07-08 ENCOUNTER — Inpatient Hospital Stay (HOSPITAL_BASED_OUTPATIENT_CLINIC_OR_DEPARTMENT_OTHER): Payer: Medicare Other | Admitting: Oncology

## 2016-07-08 VITALS — BP 147/80 | HR 105 | Temp 99.3°F | Resp 18 | Wt 241.2 lb

## 2016-07-08 DIAGNOSIS — C8318 Mantle cell lymphoma, lymph nodes of multiple sites: Secondary | ICD-10-CM

## 2016-07-08 DIAGNOSIS — R590 Localized enlarged lymph nodes: Secondary | ICD-10-CM

## 2016-07-08 DIAGNOSIS — Z803 Family history of malignant neoplasm of breast: Secondary | ICD-10-CM

## 2016-07-08 DIAGNOSIS — Z9641 Presence of insulin pump (external) (internal): Secondary | ICD-10-CM

## 2016-07-08 DIAGNOSIS — E11311 Type 2 diabetes mellitus with unspecified diabetic retinopathy with macular edema: Secondary | ICD-10-CM

## 2016-07-08 DIAGNOSIS — I10 Essential (primary) hypertension: Secondary | ICD-10-CM

## 2016-07-08 DIAGNOSIS — E1143 Type 2 diabetes mellitus with diabetic autonomic (poly)neuropathy: Secondary | ICD-10-CM

## 2016-07-08 DIAGNOSIS — Z79899 Other long term (current) drug therapy: Secondary | ICD-10-CM

## 2016-07-08 DIAGNOSIS — D696 Thrombocytopenia, unspecified: Secondary | ICD-10-CM

## 2016-07-08 DIAGNOSIS — Z7982 Long term (current) use of aspirin: Secondary | ICD-10-CM

## 2016-07-08 DIAGNOSIS — G629 Polyneuropathy, unspecified: Secondary | ICD-10-CM

## 2016-07-08 DIAGNOSIS — Z905 Acquired absence of kidney: Secondary | ICD-10-CM

## 2016-07-08 DIAGNOSIS — E0500 Thyrotoxicosis with diffuse goiter without thyrotoxic crisis or storm: Secondary | ICD-10-CM

## 2016-07-08 DIAGNOSIS — E7800 Pure hypercholesterolemia, unspecified: Secondary | ICD-10-CM

## 2016-07-08 DIAGNOSIS — C8310 Mantle cell lymphoma, unspecified site: Secondary | ICD-10-CM

## 2016-07-08 DIAGNOSIS — Z87891 Personal history of nicotine dependence: Secondary | ICD-10-CM

## 2016-07-08 NOTE — Progress Notes (Signed)
States is feeling well. Offers no complaints. 

## 2016-08-11 ENCOUNTER — Telehealth: Payer: Self-pay | Admitting: Internal Medicine

## 2016-08-11 NOTE — Telephone Encounter (Signed)
I called pt and left a vm to call office to sch AWV. Thank you! °

## 2016-09-17 ENCOUNTER — Other Ambulatory Visit: Payer: Self-pay

## 2016-09-22 DIAGNOSIS — E0500 Thyrotoxicosis with diffuse goiter without thyrotoxic crisis or storm: Secondary | ICD-10-CM | POA: Diagnosis not present

## 2016-09-22 DIAGNOSIS — E10649 Type 1 diabetes mellitus with hypoglycemia without coma: Secondary | ICD-10-CM | POA: Diagnosis not present

## 2016-09-22 LAB — HEMOGLOBIN A1C: Hemoglobin A1C: 6.4

## 2016-09-22 LAB — MICROALBUMIN, URINE: MICROALB UR: 1.9

## 2016-09-25 ENCOUNTER — Encounter: Payer: Medicare Other | Admitting: Internal Medicine

## 2016-10-08 ENCOUNTER — Ambulatory Visit: Payer: Medicare Other

## 2016-10-22 ENCOUNTER — Ambulatory Visit (INDEPENDENT_AMBULATORY_CARE_PROVIDER_SITE_OTHER): Payer: Medicare Other

## 2016-10-22 VITALS — HR 98 | Temp 98.2°F | Resp 12 | Ht 70.0 in | Wt 235.8 lb

## 2016-10-22 DIAGNOSIS — Z0000 Encounter for general adult medical examination without abnormal findings: Secondary | ICD-10-CM | POA: Diagnosis not present

## 2016-10-22 DIAGNOSIS — Z23 Encounter for immunization: Secondary | ICD-10-CM | POA: Diagnosis not present

## 2016-10-22 NOTE — Progress Notes (Signed)
Subjective:   ROBERTA KELLY is a 71 y.o. male who presents for an Initial Medicare Annual Wellness Visit.  Review of Systems  No ROS.  Medicare Wellness Visit.  Cardiac Risk Factors include: advanced age (>50men, >80 women);hypertension;diabetes mellitus;obesity (BMI >30kg/m2)    Objective:    Today's Vitals   10/22/16 1056  Pulse: 98  Resp: 12  Temp: 98.2 F (36.8 C)  TempSrc: Oral  SpO2: 99%  Weight: 235 lb 12.8 oz (107 kg)  Height: 5\' 10"  (1.778 m)   Body mass index is 33.83 kg/m.  Current Medications (verified) Outpatient Encounter Prescriptions as of 10/22/2016  Medication Sig  . aspirin EC 325 MG tablet Take by mouth.  Marland Kitchen FLUoxetine (PROZAC) 40 MG capsule Take 40 mg by mouth daily.  . fluticasone (FLOVENT HFA) 110 MCG/ACT inhaler Inhale 2 puffs into the lungs 2 (two) times daily.  . insulin aspart (NOVOLOG) 100 UNIT/ML injection Use as directed  . lisinopril (PRINIVIL,ZESTRIL) 20 MG tablet TAKE 1 TABLET BY MOUTH AT BEDTIME  . methimazole (TAPAZOLE) 10 MG tablet Take 20 mg by mouth 2 (two) times daily.  . metoCLOPramide (REGLAN) 10 MG tablet TAKE 1 TABLET (10 MG TOTAL) BY MOUTH AT BEDTIME.  . mupirocin ointment (BACTROBAN) 2 % Apply to affected area bid  . pravastatin (PRAVACHOL) 40 MG tablet TAKE 1 TABLET BY MOUTH AT BEDTIME  . traZODone (DESYREL) 50 MG tablet Take 50 mg by mouth daily as needed.  . pantoprazole (PROTONIX) 40 MG tablet TAKE 1 TABLET (40 MG TOTAL) BY MOUTH DAILY. (Patient not taking: Reported on 10/22/2016)  . sucralfate (CARAFATE) 1 G tablet TAKE 1 TABLET BY MOUTH 4 TIMES A DAY BEFORE MEALS AND AT BEDTIME FOR 30 DAYS (Patient not taking: Reported on 10/22/2016)   No facility-administered encounter medications on file as of 10/22/2016.     Allergies (verified) Rofecoxib   History: Past Medical History:  Diagnosis Date  . Depression   . 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 Gastroparesis     . Graves disease   . Hypercholesterolemia   . Hypertension   . Mantle cell lymphoma ([place])   . Peripheral neuropathy [facility])    Past Surgical History:  Procedure Laterality Date  . NEPHRECTOMY     right   Family History  Problem Relation Age of Onset  . Breast cancer Mother   . Diabetes Father   . Cancer Father     Lymphoma  . Alcohol abuse Maternal Uncle   . Diabetes Sister   . Heart disease      grandfather   Social History   Occupational History  . Not on file.   Social History Main Topics  . Smoking status: Former Research scientist (life sciences)  . Smokeless tobacco: Current User    Types: Chew  . Alcohol use 1.8 oz/week    3 Cans of beer per week     Comment: nightly  . Drug use: No  . Sexual activity: Not Currently   Tobacco Counseling Ready to quit: Not Answered Counseling given: Not Answered   Activities of Daily Living In your present state of health, do you have any difficulty performing the following activities: 10/22/2016  Hearing? N  Vision? N  Difficulty concentrating or making decisions? N  Walking or climbing stairs? N  Dressing or bathing? N  Doing errands, shopping? N  Preparing Food and eating ? N  Using the Toilet? N  In the past six months,  have you accidently leaked urine? N  Do you have problems with loss of bowel control? N  Managing your Medications? N  Managing your Finances? N  Housekeeping or managing your Housekeeping? N  Some recent data might be hidden    Immunizations and Health Maintenance Immunization History  Administered Date(s) Administered  . Influenza Split 07/13/2014  . Influenza, High Dose Seasonal PF 10/22/2016  . Influenza,inj,Quad PF,36+ Mos 06/20/2013, 08/09/2015  . Pneumococcal Conjugate-13 12/26/2014   Health Maintenance Due  Topic Date Due  . Hepatitis C Screening  [birth year]  . FOOT EXAM  07/07/1958  . TETANUS/TDAP  07/08/1967  . ZOSTAVAX  07/07/2008  . OPHTHALMOLOGY EXAM  05/23/2015  . HEMOGLOBIN A1C  10/08/2015  . PNA  vac Low Risk Adult (2 of 2 - PPSV23) 12/26/2015    Patient Care Team: [name], MD as PCP - General (Internal Medicine)  Indicate any recent Medical Services you may have received from other than [facility] providers in the past year (date may be approximate).    Assessment:   This is a routine wellness examination for [place]. The goal of the wellness visit is to assist the patient how to close the gaps in care and create a preventative care plan for the patient.   Osteoporosis risk reviewed.  Medications reviewed; taking without issues or barriers.  Safety issues reviewed; smoke detectors in the home. No firearms in the home. Wears seatbelts when driving or riding with others. No violence in the home.  No identified risk were noted; The patient was oriented x 3; appropriate in dress and manner and no objective failures at ADL's or IADL's.   BMI; discussed the importance of a healthy diet, water intake and exercise. Educational material provided.  High dose influenza vaccine administered L deltoid, tolerated well.  Educational material provided.  Patient Concerns: None at this time. Follow up with PCP as needed.  Hearing/Vision screen Hearing Screening Comments: Difficulty hearing a whisper  Audiologic testing deferred per patient preference  Vision Screening Comments: Followed by [facility] Cataracts extracted, bilateral Wears glasses Visual acuity not assessed per patient preference since he has regular follow up with his ophthamologist  Dietary issues and exercise activities discussed: Current Exercise Habits: The patient does not participate in regular exercise at present  Goals    . Increase water intake      Depression Screen PHQ 2/9 Scores 10/22/2016 08/09/2015 04/09/2015 12/26/2014  PHQ - 2 Score 0 0 0 0  PHQ- 9 Score - - - -    Fall Risk Fall Risk  10/22/2016 09/17/2016 08/09/2015 04/09/2015 12/26/2014  Falls in the past year? Yes No Yes No No    Number falls in past yr: 1 - 1 - -  Injury with Fall? No - No - -  Follow up Falls prevention discussed;Education provided - - - -    Cognitive Function: MMSE - Mini Mental State Exam 10/22/2016  Orientation to time 5  Orientation to Place 5  Registration 3  Attention/ Calculation 5  Recall 3  Language- name 2 objects 2  Language- repeat 1  Language- follow 3 step command 3  Language- read & follow direction 1  Write a sentence 1  Copy design 1  Total score 30        Screening Tests Health Maintenance  Topic Date Due  . Hepatitis C Screening  [birth year]  . FOOT EXAM  07/07/1958  . TETANUS/TDAP  07/08/1967  . ZOSTAVAX  07/07/2008  . OPHTHALMOLOGY EXAM  05/23/2015  . HEMOGLOBIN A1C  10/08/2015  . PNA vac Low Risk Adult (2 of 2 - PPSV23) 12/26/2015  . COLONOSCOPY  01/09/2024  . INFLUENZA VACCINE  Completed        Plan:    End of life planning; Advance aging; Advanced directives discussed. No Copy of current HCPOA/Living.  Additional information deferred at this time per patient request.  Medicare Attestation I have personally reviewed: The patient's medical and social history Their use of alcohol, tobacco or illicit drugs Their current medications and supplements The patient's functional ability including ADLs,fall risks, home safety risks, cognitive, and hearing and visual impairment Diet and physical activities Evidence for depression   The patient's weight, height, BMI, and visual acuity have been recorded in the chart.  I have made referrals and provided education to the patient based on review of the above and I have provided the patient with a written personalized care plan for preventive services.    During the course of the visit Keeven was educated and counseled about the following appropriate screening and preventive services:   Vaccines to include Pneumoccal, Influenza, Hepatitis B, Td, Zostavax, HCV  Electrocardiogram  Colorectal cancer  screening  Cardiovascular disease screening  Diabetes screening  Glaucoma screening  Nutrition counseling  Prostate cancer screening  Smoking cessation counseling  Patient Instructions (the written plan) were given to the patient.   Varney Biles, LPN   12/05/1144    Reviewed above information.  Agree with plan.  [name]

## 2016-10-22 NOTE — Patient Instructions (Addendum)
Gary [name] , Thank you for taking time to come for your Medicare Wellness Visit. I appreciate your ongoing commitment to your health goals. Please review the following plan we discussed and let me know if I can assist you in the future.   Follow up with Dr. Nicki Reaper as needed.  These are the goals we discussed: Goals    . Increase water intake       This is a list of the screening recommended for you and due dates:  Health Maintenance  Topic Date Due  .  Hepatitis C: One time screening is recommended by Center for Disease Control  (CDC) for  adults born from [birth year] through 1963.   1945-12-25  . Complete foot exam   07/07/1958  . Tetanus Vaccine  07/08/1967  . Shingles Vaccine  07/07/2008  . Eye exam for diabetics  05/23/2015  . Hemoglobin A1C  10/08/2015  . Pneumonia vaccines (2 of 2 - PPSV23) 12/26/2015  . Colon Cancer Screening  01/09/2024  . Flu Shot  Completed      Fall Prevention in the Home Introduction Falls can cause injuries. They can happen to people of all ages. There are many things you can do to make your home safe and to help prevent falls. What can I do on the outside of my home?  Regularly fix the edges of walkways and driveways and fix any cracks.  Remove anything that might make you trip as you walk through a door, such as a raised step or threshold.  Trim any bushes or trees on the path to your home.  Use bright outdoor lighting.  Clear any walking paths of anything that might make someone trip, such as rocks or tools.  Regularly check to see if handrails are loose or broken. Make sure that both sides of any steps have handrails.  Any raised decks and porches should have guardrails on the edges.  Have any leaves, snow, or ice cleared regularly.  Use sand or salt on walking paths during winter.  Clean up any spills in your garage right away. This includes oil or grease spills. What can I do in the bathroom?  Use night lights.  Install grab bars by  the toilet and in the tub and shower. Do not use towel bars as grab bars.  Use non-skid mats or decals in the tub or shower.  If you need to sit down in the shower, use a plastic, non-slip stool.  Keep the floor dry. Clean up any water that spills on the floor as soon as it happens.  Remove soap buildup in the tub or shower regularly.  Attach bath mats securely with double-sided non-slip rug tape.  Do not have throw rugs and other things on the floor that can make you trip. What can I do in the bedroom?  Use night lights.  Make sure that you have a light by your bed that is easy to reach.  Do not use any sheets or blankets that are too big for your bed. They should not hang down onto the floor.  Have a firm chair that has side arms. You can use this for support while you get dressed.  Do not have throw rugs and other things on the floor that can make you trip. What can I do in the kitchen?  Clean up any spills right away.  Avoid walking on wet floors.  Keep items that you use a lot in easy-to-reach places.  If you need  to reach something above you, use a strong step stool that has a grab bar.  Keep electrical cords out of the way.  Do not use floor polish or wax that makes floors slippery. If you must use wax, use non-skid floor wax.  Do not have throw rugs and other things on the floor that can make you trip. What can I do with my stairs?  Do not leave any items on the stairs.  Make sure that there are handrails on both sides of the stairs and use them. Fix handrails that are broken or loose. Make sure that handrails are as long as the stairways.  Check any carpeting to make sure that it is firmly attached to the stairs. Fix any carpet that is loose or worn.  Avoid having throw rugs at the top or bottom of the stairs. If you do have throw rugs, attach them to the floor with carpet tape.  Make sure that you have a light switch at the top of the stairs and the bottom  of the stairs. If you do not have them, ask someone to add them for you. What else can I do to help prevent falls?  Wear shoes that:  Do not have high heels.  Have rubber bottoms.  Are comfortable and fit you well.  Are closed at the toe. Do not wear sandals.  If you use a stepladder:  Make sure that it is fully opened. Do not climb a closed stepladder.  Make sure that both sides of the stepladder are locked into place.  Ask someone to hold it for you, if possible.  Clearly mark and make sure that you can see:  Any grab bars or handrails.  First and last steps.  Where the edge of each step is.  Use tools that help you move around (mobility aids) if they are needed. These include:  Canes.  Walkers.  Scooters.  Crutches.  Turn on the lights when you go into a dark area. Replace any light bulbs as soon as they burn out.  Set up your furniture so you have a clear path. Avoid moving your furniture around.  If any of your floors are uneven, fix them.  If there are any pets around you, be aware of where they are.  Review your medicines with your doctor. Some medicines can make you feel dizzy. This can increase your chance of falling. Ask your doctor what other things that you can do to help prevent falls. This information is not intended to replace advice given to you by your health care provider. Make sure you discuss any questions you have with your health care provider. Document Released: 07/25/2009 Document Revised: 03/05/2016 Document Reviewed: 11/02/2014  2017 [employer]vier

## 2016-11-17 DIAGNOSIS — E0500 Thyrotoxicosis with diffuse goiter without thyrotoxic crisis or storm: Secondary | ICD-10-CM | POA: Diagnosis not present

## 2016-11-17 DIAGNOSIS — K319 Disease of stomach and duodenum, unspecified: Secondary | ICD-10-CM | POA: Diagnosis not present

## 2016-11-17 DIAGNOSIS — E1042 Type 1 diabetes mellitus with diabetic polyneuropathy: Secondary | ICD-10-CM | POA: Diagnosis not present

## 2016-11-17 DIAGNOSIS — Z4681 Encounter for fitting and adjustment of insulin pump: Secondary | ICD-10-CM | POA: Diagnosis not present

## 2016-11-17 DIAGNOSIS — E1043 Type 1 diabetes mellitus with diabetic autonomic (poly)neuropathy: Secondary | ICD-10-CM | POA: Diagnosis not present

## 2016-11-17 DIAGNOSIS — E10649 Type 1 diabetes mellitus with hypoglycemia without coma: Secondary | ICD-10-CM | POA: Diagnosis not present

## 2016-11-17 DIAGNOSIS — E785 Hyperlipidemia, unspecified: Secondary | ICD-10-CM | POA: Diagnosis not present

## 2016-12-09 ENCOUNTER — Telehealth: Payer: Self-pay | Admitting: *Deleted

## 2016-12-09 NOTE — Telephone Encounter (Signed)
FYI Pt received a automatic call to have a A1c drawn, pt had this lab completed [facility] by Dr Eddie Dibbles.  Pt contact [phone]

## 2016-12-09 NOTE — Telephone Encounter (Signed)
L/m to let pt know that I have updated records with a1c from Dr. Sammuel Hines office.

## 2016-12-18 ENCOUNTER — Encounter: Payer: Medicare Other | Admitting: Internal Medicine

## 2016-12-23 ENCOUNTER — Inpatient Hospital Stay: Payer: Medicare Other

## 2016-12-23 ENCOUNTER — Ambulatory Visit: Payer: Medicare Other

## 2016-12-28 ENCOUNTER — Ambulatory Visit: Payer: Medicare Other | Admitting: Oncology

## 2016-12-30 ENCOUNTER — Ambulatory Visit: Payer: Medicare Other | Admitting: Oncology

## 2017-01-11 ENCOUNTER — Inpatient Hospital Stay
Admission: EM | Admit: 2017-01-11 | Discharge: 2017-01-13 | DRG: 812 | Disposition: A | Discharge status: Home Health | Payer: Medicare Other | Attending: Internal Medicine | Admitting: Internal Medicine

## 2017-01-11 ENCOUNTER — Emergency Department: Payer: Medicare Other

## 2017-01-11 ENCOUNTER — Encounter: Payer: Self-pay | Admitting: Emergency Medicine

## 2017-01-11 ENCOUNTER — Telehealth: Payer: Self-pay | Admitting: Internal Medicine

## 2017-01-11 DIAGNOSIS — K2950 Unspecified chronic gastritis without bleeding: Secondary | ICD-10-CM | POA: Diagnosis present

## 2017-01-11 DIAGNOSIS — C8310 Mantle cell lymphoma, unspecified site: Secondary | ICD-10-CM | POA: Diagnosis present

## 2017-01-11 DIAGNOSIS — Z833 Family history of diabetes mellitus: Secondary | ICD-10-CM

## 2017-01-11 DIAGNOSIS — Z9049 Acquired absence of other specified parts of digestive tract: Secondary | ICD-10-CM

## 2017-01-11 DIAGNOSIS — D649 Anemia, unspecified: Secondary | ICD-10-CM | POA: Diagnosis present

## 2017-01-11 DIAGNOSIS — E11649 Type 2 diabetes mellitus with hypoglycemia without coma: Secondary | ICD-10-CM | POA: Diagnosis present

## 2017-01-11 DIAGNOSIS — Z807 Family history of other malignant neoplasms of lymphoid, hematopoietic and related tissues: Secondary | ICD-10-CM

## 2017-01-11 DIAGNOSIS — E0500 Thyrotoxicosis with diffuse goiter without thyrotoxic crisis or storm: Secondary | ICD-10-CM | POA: Diagnosis present

## 2017-01-11 DIAGNOSIS — Z720 Tobacco use: Secondary | ICD-10-CM

## 2017-01-11 DIAGNOSIS — F4321 Adjustment disorder with depressed mood: Secondary | ICD-10-CM | POA: Diagnosis present

## 2017-01-11 DIAGNOSIS — R740 Nonspecific elevation of levels of transaminase and lactic acid dehydrogenase [LDH]: Secondary | ICD-10-CM | POA: Diagnosis present

## 2017-01-11 DIAGNOSIS — D531 Other megaloblastic anemias, not elsewhere classified: Secondary | ICD-10-CM | POA: Diagnosis not present

## 2017-01-11 DIAGNOSIS — Z7982 Long term (current) use of aspirin: Secondary | ICD-10-CM

## 2017-01-11 DIAGNOSIS — E46 Unspecified protein-calorie malnutrition: Secondary | ICD-10-CM | POA: Diagnosis present

## 2017-01-11 DIAGNOSIS — E1142 Type 2 diabetes mellitus with diabetic polyneuropathy: Secondary | ICD-10-CM | POA: Diagnosis present

## 2017-01-11 DIAGNOSIS — E1143 Type 2 diabetes mellitus with diabetic autonomic (poly)neuropathy: Secondary | ICD-10-CM | POA: Diagnosis present

## 2017-01-11 DIAGNOSIS — Z794 Long term (current) use of insulin: Secondary | ICD-10-CM

## 2017-01-11 DIAGNOSIS — Z79899 Other long term (current) drug therapy: Secondary | ICD-10-CM

## 2017-01-11 DIAGNOSIS — E785 Hyperlipidemia, unspecified: Secondary | ICD-10-CM | POA: Diagnosis present

## 2017-01-11 DIAGNOSIS — K3184 Gastroparesis: Secondary | ICD-10-CM | POA: Diagnosis present

## 2017-01-11 DIAGNOSIS — E538 Deficiency of other specified B group vitamins: Secondary | ICD-10-CM | POA: Diagnosis present

## 2017-01-11 DIAGNOSIS — D696 Thrombocytopenia, unspecified: Secondary | ICD-10-CM | POA: Diagnosis present

## 2017-01-11 DIAGNOSIS — K219 Gastro-esophageal reflux disease without esophagitis: Secondary | ICD-10-CM | POA: Diagnosis present

## 2017-01-11 DIAGNOSIS — E7800 Pure hypercholesterolemia, unspecified: Secondary | ICD-10-CM | POA: Diagnosis present

## 2017-01-11 DIAGNOSIS — I10 Essential (primary) hypertension: Secondary | ICD-10-CM | POA: Diagnosis present

## 2017-01-11 DIAGNOSIS — Z888 Allergy status to other drugs, medicaments and biological substances status: Secondary | ICD-10-CM

## 2017-01-11 DIAGNOSIS — I2510 Atherosclerotic heart disease of native coronary artery without angina pectoris: Secondary | ICD-10-CM | POA: Diagnosis present

## 2017-01-11 DIAGNOSIS — Z905 Acquired absence of kidney: Secondary | ICD-10-CM

## 2017-01-11 DIAGNOSIS — F1020 Alcohol dependence, uncomplicated: Secondary | ICD-10-CM | POA: Diagnosis present

## 2017-01-11 DIAGNOSIS — R0602 Shortness of breath: Secondary | ICD-10-CM | POA: Diagnosis not present

## 2017-01-11 DIAGNOSIS — E11311 Type 2 diabetes mellitus with unspecified diabetic retinopathy with macular edema: Secondary | ICD-10-CM | POA: Diagnosis present

## 2017-01-11 DIAGNOSIS — F329 Major depressive disorder, single episode, unspecified: Secondary | ICD-10-CM | POA: Diagnosis present

## 2017-01-11 DIAGNOSIS — T382X5A Adverse effect of antithyroid drugs, initial encounter: Secondary | ICD-10-CM | POA: Diagnosis present

## 2017-01-11 DIAGNOSIS — K625 Hemorrhage of anus and rectum: Secondary | ICD-10-CM | POA: Diagnosis present

## 2017-01-11 LAB — BASIC METABOLIC PANEL
ANION GAP: 8 (ref 5–15)
BUN: 15 mg/dL (ref 6–20)
CO2: 24 mmol/L (ref 22–32)
Calcium: 7.7 mg/dL — ABNORMAL LOW (ref 8.9–10.3)
Chloride: 102 mmol/L (ref 101–111)
Creatinine, Ser: 0.95 mg/dL (ref 0.61–1.24)
GFR calc Af Amer: >60 mL/min (ref >60)
GFR calc non Af Amer: >60 mL/min (ref >60)
GLUCOSE: 177 mg/dL — AB (ref 65–99)
POTASSIUM: 3.9 mmol/L (ref 3.5–5.1)
Sodium: 134 mmol/L — ABNORMAL LOW (ref 135–145)

## 2017-01-11 LAB — PROTIME-INR
INR: 1.22
Prothrombin Time: 15.5 seconds — ABNORMAL HIGH (ref 11.4–15.2)

## 2017-01-11 LAB — CBC
HEMATOCRIT: 22.5 % — AB (ref 40.0–52.0)
HEMOGLOBIN: 7.9 g/dL — AB (ref 13.0–18.0)
MCH: 43 pg — AB (ref 26.0–34.0)
MCHC: 35.2 g/dL (ref 32.0–36.0)
MCV: 122.1 fL — ABNORMAL HIGH (ref 80.0–100.0)
Platelets: 150 10*3/uL (ref 150–440)
RBC: 1.85 MIL/uL — AB (ref 4.40–5.90)
RDW: 17.4 % — ABNORMAL HIGH (ref 11.5–14.5)
WBC: 4.4 10*3/uL (ref 3.8–10.6)

## 2017-01-11 LAB — RETICULOCYTES
RBC.: 1.86 MIL/uL — AB (ref 4.40–5.90)
RETIC COUNT ABSOLUTE: 50.2 10*3/uL (ref 19.0–183.0)
Retic Ct Pct: 2.7 % (ref 0.4–3.1)

## 2017-01-11 NOTE — Telephone Encounter (Signed)
Randi called from Adult Protective services and would like to get a call back if possible @ [phone]. Thank you!

## 2017-01-11 NOTE — ED Notes (Signed)
Patient states he will give urine sample after he finishes eating the meal that his son brought to him.

## 2017-01-11 NOTE — ED Triage Notes (Addendum)
Patient presents to the ED with shortness of breath and weakness.  Patient has very dirty clothes and appears disheveled.  Patient is oriented x 4.  Patient is complaining of weakness and shortness of breath for the past few days.  Patient states his shortness of breath is mainly on exertion, "like when I take the dog out."   Patient reports he has been eating 1 sandwich a day because his son is taking his social security money and spending it.  Patient states, "social services is looking into things."  Patient and wife live with his son and his son's girlfriend and do not feel that they are being taken good care of.  Patient has an insulin pump but EMS came out to his house yesterday and patient's blood sugar was 22.

## 2017-01-11 NOTE — Telephone Encounter (Signed)
Left message to return call to our office.  

## 2017-01-11 NOTE — ED Provider Notes (Signed)
[facility] Emergency Department Provider Note  ___________________________________________   None    (approximate)  I have reviewed the triage vital signs and the nursing notes.   HISTORY  Chief Complaint Shortness of Breath and Weakness  HPI Gary [name] is a 71 y.o. male who presents to the emergency department for evaluation of multiple medical complaints. He complains of weakness and shortness of breath for the past 2 weeks. He also states that he has not been eating and doesn't feel hungry. Son verbalizes concern for patient's wellbeing and states that he is unwilling to eat or shower. He tells me that he has made multiple attempts to help him, but his father is stubborn and doesn't want help. Son denies any recent change in mentation--no periods of confusion or delirium. Also states that he has been attempting to get him to come to see the doctor for quite some time, however he has refused until tonight but he is unsure what made him change his mind. Son states that last night, he called EMS because his dad was just staring into space. Upon their arrival, his blood sugar level was 26. Son states that he has been unable to convince his dad that not eating and continuing to use the insulin is not a good idea but he states the patient refuses to listen to reason. Son requested that Department of human services become involved in the case and those referrals are in place.   Past Medical History:  Diagnosis Date  . Depression   . 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 Gastroparesis   . Graves disease   . Hypercholesterolemia   . Hypertension   . Mantle cell lymphoma ([place])   . Peripheral neuropathy [facility])     Patient Active Problem List   Diagnosis Date Noted  . Symptomatic anemia 01/12/2017  . Hand laceration 12/16/2015  . Adjustment disorder with depressed mood 08/11/2015  . CAD in native artery  11/02/2014  . Swelling of left lower extremity 08/03/2014  . Mantle cell lymphoma ([place]) 08/03/2014  . History of colonic polyps 04/29/2014  . Irregular heart beat 04/22/2014  . SOB (shortness of breath) 04/22/2014  . Stress 03/21/2014  . GERD (gastroesophageal reflux disease) 10/23/2013  . Gastroparesis 06/25/2013  . Chest pain 04/13/2013  . Thyroid disease 04/13/2013  . Diabetes ([place]) 04/13/2013  . Essential hypertension, benign 04/13/2013  . Hypercholesterolemia 04/13/2013    Past Surgical History:  Procedure Laterality Date  . NEPHRECTOMY     right    Prior to Admission medications   Medication Sig Start Date End Date Taking? Authorizing Provider  ACCU-CHEK AVIVA PLUS test strip USE AS INSTRUCTED. USE 1 STRIP EVERY 3 HOURS . ACCU-CHECK AVIVA PLUS TEST STRIP. DX D74.128 01/06/17  Yes Historical Provider, MD  insulin aspart (NOVOLOG) 100 UNIT/ML injection Use as directed 04/12/13  Yes [name], MD  aspirin EC 325 MG tablet Take by mouth.    Historical Provider, MD  FLUoxetine (PROZAC) 40 MG capsule Take 40 mg by mouth daily. 01/09/15   Historical Provider, MD  fluticasone (FLOVENT HFA) 110 MCG/ACT inhaler Inhale 2 puffs into the lungs 2 (two) times daily. Patient not taking: Reported on 01/12/2017 04/24/14   [name], MD  lisinopril (PRINIVIL,ZESTRIL) 20 MG tablet TAKE 1 TABLET BY MOUTH AT BEDTIME Patient not taking: Reported on 01/12/2017 03/25/15   [name], MD  methimazole (TAPAZOLE) 10 MG tablet Take 20 mg by  mouth 2 (two) times daily.    Historical Provider, MD  metoCLOPramide (REGLAN) 10 MG tablet TAKE 1 TABLET (10 MG TOTAL) BY MOUTH AT BEDTIME. Patient not taking: Reported on 01/12/2017 12/25/13   [name], MD  mupirocin ointment (BACTROBAN) 2 % Apply to affected area bid Patient not taking: Reported on 01/12/2017 12/11/15   [name], MD  pantoprazole (PROTONIX) 40 MG tablet TAKE 1 TABLET (40 MG TOTAL) BY MOUTH DAILY. Patient not taking: Reported on  10/22/2016 12/25/13   [name], MD  pravastatin (PRAVACHOL) 40 MG tablet TAKE 1 TABLET BY MOUTH AT BEDTIME Patient not taking: Reported on 01/12/2017 01/13/16   [name], MD  sucralfate (CARAFATE) 1 G tablet TAKE 1 TABLET BY MOUTH 4 TIMES A DAY BEFORE MEALS AND AT BEDTIME FOR 30 DAYS Patient not taking: Reported on 10/22/2016 10/13/13   [name], MD  traZODone (DESYREL) 50 MG tablet Take 50 mg by mouth daily as needed. 02/26/15   Historical Provider, MD    Allergies Rofecoxib  Family History  Problem Relation Age of Onset  . Breast cancer Mother   . Diabetes Father   . Cancer Father     Lymphoma  . Alcohol abuse Maternal Uncle   . Diabetes Sister   . Heart disease      grandfather    Social History Social History  Substance Use Topics  . Smoking status: Former Research scientist (life sciences)  . Smokeless tobacco: Current User    Types: Chew  . Alcohol use 1.8 oz/week    3 Cans of beer per week     Comment: nightly    Review of Systems Constitutional: No fever/chills Eyes: No visual changes. ENT: No sore throat. Cardiovascular: Denies chest pain. Respiratory: Positive for shortness of breath. Gastrointestinal: No abdominal pain.  No nausea, no vomiting.  No diarrhea.  No constipation. Genitourinary: Negative for dysuria. Musculoskeletal: Negative for back pain. Skin: Negative for rash. Neurological: Negative for headaches, focal weakness or numbness. 10-point ROS otherwise negative.  ____________________________________________   PHYSICAL EXAM:  VITAL SIGNS: ED Triage Vitals  Enc Vitals Group     BP 01/11/17 1852 131/71     Pulse Rate 01/11/17 1852 (!) 102     Resp 01/11/17 1852 18     Temp 01/11/17 1852 98.4 F (36.9 C)     Temp Source 01/11/17 1852 Oral     SpO2 01/11/17 1852 100 %     Weight 01/11/17 1853 210 lb (95.3 kg)     Height 01/11/17 1853 5\' 10"  (1.778 m)     Head Circumference --      Peak Flow --      Pain Score 01/11/17 1852 0     Pain Loc --      Pain  Edu? --      Excl. in [place]? --     Constitutional: Alert and oriented. Disheveled in appearance. Eyes: Conjunctivae are normal. PERRL. EOMI. Head: Atraumatic. Nose: No congestion/rhinnorhea. Mouth/Throat: Mucous membranes are moist. Neck: No stridor.   Hematological/Lymphatic/Immunilogical: No palpable cervical lymphadenopathy. Cardiovascular: Normal rate, regular rhythm. Grossly normal heart sounds.  Good peripheral circulation. Respiratory: Normal respiratory effort.  No retractions. Lungs CTAB. Gastrointestinal: Soft and nontender to deep palpation. No distention. No abdominal bruits. No CVA tenderness. On rectal exam, hemorrhoids are noted without concern of thrombosis. No bright red rectal bleeding noted. Heme positive stool Musculoskeletal: No lower extremity tenderness nor edema.  No joint effusions. Neurologic:  Normal speech and language. No gross focal neurologic deficits  are appreciated. No gait instability. Skin:  Skin is warm, dry, but dirty. Erythema as noted over the sacrum and coccyx without staging decubitus. Psychiatric: Mood and affect are normal. Speech and behavior are normal.  ____________________________________________   LABS (all labs ordered are listed, but only abnormal results are displayed)  Labs Reviewed  BASIC METABOLIC PANEL - Abnormal; Notable for the following:       Result Value   Sodium 134 (*)    Glucose, Bld 177 (*)    Calcium 7.7 (*)    All other components within normal limits  CBC - Abnormal; Notable for the following:    RBC 1.85 (*)    Hemoglobin 7.9 (*)    HCT 22.5 (*)    MCV 122.1 (*)    MCH 43.0 (*)    RDW 17.4 (*)    All other components within normal limits  URINALYSIS, COMPLETE (UACMP) WITH MICROSCOPIC - Abnormal; Notable for the following:    Color, Urine AMBER (*)    APPearance HAZY (*)    Glucose, UA 50 (*)    Ketones, ur 5 (*)    Protein, ur 30 (*)    Bacteria, UA RARE (*)    Squamous Epithelial / LPF 0-5 (*)    All  other components within normal limits  PROTIME-INR - Abnormal; Notable for the following:    Prothrombin Time 15.5 (*)    All other components within normal limits  IRON AND TIBC - Abnormal; Notable for the following:    TIBC 213 (*)    Saturation Ratios 78 (*)    All other components within normal limits  LACTATE DEHYDROGENASE - Abnormal; Notable for the following:    LDH 865 (*)    All other components within normal limits  RETICULOCYTES - Abnormal; Notable for the following:    RBC. 1.86 (*)    All other components within normal limits  HEPATIC FUNCTION PANEL - Abnormal; Notable for the following:    Total Protein 6.1 (*)    Albumin 3.3 (*)    ALT 13 (*)    Total Bilirubin 2.4 (*)    Indirect Bilirubin 2.1 (*)    All other components within normal limits  FERRITIN  FOLATE  VITAMIN B12  URINE DRUG SCREEN, QUALITATIVE ([facility] ONLY)  ETHANOL  HAPTOGLOBIN  CBG MONITORING, ED  TYPE AND SCREEN   ____________________________________________  EKG  Normal sinus rhythm with a left axis deviation and incomplete right bundle branch block. Ventricular rate of 100 bpm, PR interval ?0.15 0.108, QT is 0.38 ____________________________________________  RADIOLOGY  Chest x-ray is negative for acute cardiopulmonary abnormality per radiology. I, Sherrie George, personally viewed and evaluated these images (plain radiographs) as part of my medical decision making, as well as reviewing the written report by the radiologist.  ___________________________________________   PROCEDURES  Procedure(s) performed: Heme positive stool  Procedures  Critical Care performed: No  ____________________________________________   INITIAL IMPRESSION / ASSESSMENT AND PLAN / ED COURSE  Pertinent labs & imaging results that were available during my care of the patient were reviewed by me and considered in my medical decision making (see chart for details).  71 year old male presenting to the  emergency department for evaluation of shortness of breath and weakness. He and his son both report that he has been compliant and has follow-ups with Dr. Maryjane Hurter for evaluation of his mantal cell carcinoma. Son reports that his last colonoscopy was approximately one year ago.  11:00 PM:  Family returned from home  and brought him in meal, which he is now eating. Plan will be to admit the patient secondary to megaloblastic anemia of unknown origin. Multiple factors play to the diagnosis and the patient would not a clearly be compliant with outpatient management.  12:05 AM: Case discussed with hospitalist who agrees to admit the patient. Patient's family were advised and are agreeable to the plan. Urinalysis results still pending as well as other lab studies, however are not necessary prior to admission.  ____________________________________________   FINAL CLINICAL IMPRESSION(S) / ED DIAGNOSES  Final diagnoses:  Megaloblastic anemia      NEW MEDICATIONS STARTED DURING THIS VISIT:  New Prescriptions   No medications on file     Note:  This document was prepared using Dragon voice recognition software and may include unintentional dictation errors.    Victorino Dike, FNP 01/12/17 [place], MD 01/12/17 2215

## 2017-01-12 ENCOUNTER — Inpatient Hospital Stay: Payer: Medicare Other

## 2017-01-12 ENCOUNTER — Encounter: Payer: Self-pay | Admitting: General Practice

## 2017-01-12 DIAGNOSIS — Z905 Acquired absence of kidney: Secondary | ICD-10-CM

## 2017-01-12 DIAGNOSIS — R7989 Other specified abnormal findings of blood chemistry: Secondary | ICD-10-CM

## 2017-01-12 DIAGNOSIS — Z794 Long term (current) use of insulin: Secondary | ICD-10-CM

## 2017-01-12 DIAGNOSIS — F1010 Alcohol abuse, uncomplicated: Secondary | ICD-10-CM | POA: Diagnosis not present

## 2017-01-12 DIAGNOSIS — K625 Hemorrhage of anus and rectum: Secondary | ICD-10-CM

## 2017-01-12 DIAGNOSIS — Z79899 Other long term (current) drug therapy: Secondary | ICD-10-CM

## 2017-01-12 DIAGNOSIS — C8310 Mantle cell lymphoma, unspecified site: Secondary | ICD-10-CM | POA: Diagnosis present

## 2017-01-12 DIAGNOSIS — E538 Deficiency of other specified B group vitamins: Secondary | ICD-10-CM | POA: Diagnosis present

## 2017-01-12 DIAGNOSIS — F329 Major depressive disorder, single episode, unspecified: Secondary | ICD-10-CM | POA: Diagnosis present

## 2017-01-12 DIAGNOSIS — C8318 Mantle cell lymphoma, lymph nodes of multiple sites: Secondary | ICD-10-CM

## 2017-01-12 DIAGNOSIS — R5383 Other fatigue: Secondary | ICD-10-CM

## 2017-01-12 DIAGNOSIS — D539 Nutritional anemia, unspecified: Secondary | ICD-10-CM | POA: Diagnosis not present

## 2017-01-12 DIAGNOSIS — K2940 Chronic atrophic gastritis without bleeding: Secondary | ICD-10-CM

## 2017-01-12 DIAGNOSIS — F1020 Alcohol dependence, uncomplicated: Secondary | ICD-10-CM | POA: Diagnosis present

## 2017-01-12 DIAGNOSIS — E785 Hyperlipidemia, unspecified: Secondary | ICD-10-CM | POA: Diagnosis present

## 2017-01-12 DIAGNOSIS — K922 Gastrointestinal hemorrhage, unspecified: Secondary | ICD-10-CM | POA: Diagnosis not present

## 2017-01-12 DIAGNOSIS — R0602 Shortness of breath: Secondary | ICD-10-CM | POA: Diagnosis not present

## 2017-01-12 DIAGNOSIS — D696 Thrombocytopenia, unspecified: Secondary | ICD-10-CM

## 2017-01-12 DIAGNOSIS — Z807 Family history of other malignant neoplasms of lymphoid, hematopoietic and related tissues: Secondary | ICD-10-CM | POA: Diagnosis not present

## 2017-01-12 DIAGNOSIS — K2950 Unspecified chronic gastritis without bleeding: Secondary | ICD-10-CM | POA: Diagnosis present

## 2017-01-12 DIAGNOSIS — Z87891 Personal history of nicotine dependence: Secondary | ICD-10-CM

## 2017-01-12 DIAGNOSIS — E7800 Pure hypercholesterolemia, unspecified: Secondary | ICD-10-CM

## 2017-01-12 DIAGNOSIS — E11311 Type 2 diabetes mellitus with unspecified diabetic retinopathy with macular edema: Secondary | ICD-10-CM | POA: Diagnosis present

## 2017-01-12 DIAGNOSIS — E1142 Type 2 diabetes mellitus with diabetic polyneuropathy: Secondary | ICD-10-CM | POA: Diagnosis present

## 2017-01-12 DIAGNOSIS — I10 Essential (primary) hypertension: Secondary | ICD-10-CM | POA: Diagnosis present

## 2017-01-12 DIAGNOSIS — R531 Weakness: Secondary | ICD-10-CM | POA: Diagnosis not present

## 2017-01-12 DIAGNOSIS — D519 Vitamin B12 deficiency anemia, unspecified: Secondary | ICD-10-CM | POA: Diagnosis not present

## 2017-01-12 DIAGNOSIS — E11649 Type 2 diabetes mellitus with hypoglycemia without coma: Secondary | ICD-10-CM | POA: Diagnosis present

## 2017-01-12 DIAGNOSIS — K3184 Gastroparesis: Secondary | ICD-10-CM | POA: Diagnosis present

## 2017-01-12 DIAGNOSIS — D649 Anemia, unspecified: Secondary | ICD-10-CM

## 2017-01-12 DIAGNOSIS — E46 Unspecified protein-calorie malnutrition: Secondary | ICD-10-CM | POA: Diagnosis present

## 2017-01-12 DIAGNOSIS — E0500 Thyrotoxicosis with diffuse goiter without thyrotoxic crisis or storm: Secondary | ICD-10-CM | POA: Diagnosis present

## 2017-01-12 DIAGNOSIS — R740 Nonspecific elevation of levels of transaminase and lactic acid dehydrogenase [LDH]: Secondary | ICD-10-CM | POA: Diagnosis present

## 2017-01-12 DIAGNOSIS — R5381 Other malaise: Secondary | ICD-10-CM | POA: Diagnosis not present

## 2017-01-12 DIAGNOSIS — T7401XA Adult neglect or abandonment, confirmed, initial encounter: Secondary | ICD-10-CM | POA: Diagnosis not present

## 2017-01-12 DIAGNOSIS — Z803 Family history of malignant neoplasm of breast: Secondary | ICD-10-CM

## 2017-01-12 DIAGNOSIS — D531 Other megaloblastic anemias, not elsewhere classified: Secondary | ICD-10-CM | POA: Diagnosis present

## 2017-01-12 DIAGNOSIS — K219 Gastro-esophageal reflux disease without esophagitis: Secondary | ICD-10-CM | POA: Diagnosis present

## 2017-01-12 DIAGNOSIS — I2510 Atherosclerotic heart disease of native coronary artery without angina pectoris: Secondary | ICD-10-CM | POA: Diagnosis present

## 2017-01-12 DIAGNOSIS — F4321 Adjustment disorder with depressed mood: Secondary | ICD-10-CM | POA: Diagnosis present

## 2017-01-12 DIAGNOSIS — E1143 Type 2 diabetes mellitus with diabetic autonomic (poly)neuropathy: Secondary | ICD-10-CM | POA: Diagnosis present

## 2017-01-12 LAB — GLUCOSE, CAPILLARY
GLUCOSE-CAPILLARY: 257 mg/dL — AB (ref 65–99)
Glucose-Capillary: 106 mg/dL — ABNORMAL HIGH (ref 65–99)
Glucose-Capillary: 162 mg/dL — ABNORMAL HIGH (ref 65–99)
Glucose-Capillary: 150 mg/dL — ABNORMAL HIGH (ref 65–99)
Glucose-Capillary: 127 mg/dL — ABNORMAL HIGH (ref 65–99)
Glucose-Capillary: 139 mg/dL — ABNORMAL HIGH (ref 65–99)
Glucose-Capillary: 179 mg/dL — ABNORMAL HIGH (ref 65–99)
Glucose-Capillary: 241 mg/dL — ABNORMAL HIGH (ref 65–99)

## 2017-01-12 LAB — CBC
HCT: 23.6 % — ABNORMAL LOW (ref 40.0–52.0)
HEMATOCRIT: 22.9 % — AB (ref 40.0–52.0)
HEMOGLOBIN: 8.1 g/dL — AB (ref 13.0–18.0)
Hemoglobin: 8.5 g/dL — ABNORMAL LOW (ref 13.0–18.0)
MCH: 43.7 pg — AB (ref 26.0–34.0)
MCH: 40 pg — AB (ref 26.0–34.0)
MCHC: 36.2 g/dL — ABNORMAL HIGH (ref 32.0–36.0)
MCHC: 35.2 g/dL (ref 32.0–36.0)
MCV: 120.7 fL — AB (ref 80.0–100.0)
MCV: 113.7 fL — ABNORMAL HIGH (ref 80.0–100.0)
PLATELETS: 154 10*3/uL (ref 150–440)
Platelets: 113 10*3/uL — ABNORMAL LOW (ref 150–440)
RBC: 1.95 MIL/uL — AB (ref 4.40–5.90)
RBC: 2.01 MIL/uL — ABNORMAL LOW (ref 4.40–5.90)
RDW: 17.4 % — AB (ref 11.5–14.5)
RDW: 26.1 % — AB (ref 11.5–14.5)
WBC: 4.8 10*3/uL (ref 3.8–10.6)
WBC: 3.2 10*3/uL — ABNORMAL LOW (ref 3.8–10.6)

## 2017-01-12 LAB — VITAMIN B12: Vitamin B-12: <50 pg/mL — ABNORMAL LOW (ref 180–914)

## 2017-01-12 LAB — URINALYSIS, COMPLETE (UACMP) WITH MICROSCOPIC
Bilirubin Urine: NEGATIVE
Glucose, UA: 50 mg/dL — AB
Hgb urine dipstick: NEGATIVE
KETONES UR: 5 mg/dL — AB
Leukocytes, UA: NEGATIVE
Nitrite: NEGATIVE
PROTEIN: 30 mg/dL — AB
Specific Gravity, Urine: 1.019 (ref 1.005–1.030)
pH: 5 (ref 5.0–8.0)

## 2017-01-12 LAB — PHOSPHORUS: Phosphorus: 3.8 mg/dL (ref 2.5–4.6)

## 2017-01-12 LAB — IRON AND TIBC
IRON: 173 ug/dL (ref 45–182)
Iron: 166 ug/dL (ref 45–182)
Saturation Ratios: 78 % — ABNORMAL HIGH (ref 17.9–39.5)
Saturation Ratios: 78 % — ABNORMAL HIGH (ref 17.9–39.5)
TIBC: 213 ug/dL — ABNORMAL LOW (ref 250–450)
TIBC: 223 ug/dL — AB (ref 250–450)
UIBC: 46 ug/dL
UIBC: 50 ug/dL

## 2017-01-12 LAB — HEPATIC FUNCTION PANEL
ALT: 13 U/L — AB (ref 17–63)
AST: 33 U/L (ref 15–41)
Albumin: 3.3 g/dL — ABNORMAL LOW (ref 3.5–5.0)
Alkaline Phosphatase: 59 U/L (ref 38–126)
BILIRUBIN INDIRECT: 2.1 mg/dL — AB (ref 0.3–0.9)
Bilirubin, Direct: 0.3 mg/dL (ref 0.1–0.5)
TOTAL PROTEIN: 6.1 g/dL — AB (ref 6.5–8.1)
Total Bilirubin: 2.4 mg/dL — ABNORMAL HIGH (ref 0.3–1.2)

## 2017-01-12 LAB — URINE DRUG SCREEN, QUALITATIVE (ARMC ONLY)
Amphetamines, Ur Screen: NOT DETECTED
BARBITURATES, UR SCREEN: NOT DETECTED
Benzodiazepine, Ur Scrn: NOT DETECTED
CANNABINOID 50 NG, UR ~~LOC~~: NOT DETECTED
COCAINE METABOLITE, UR ~~LOC~~: NOT DETECTED
MDMA (Ecstasy)Ur Screen: NOT DETECTED
Methadone Scn, Ur: NOT DETECTED
OPIATE, UR SCREEN: NOT DETECTED
Phencyclidine (PCP) Ur S: NOT DETECTED
TRICYCLIC, UR SCREEN: NOT DETECTED

## 2017-01-12 LAB — TROPONIN I
Troponin I: <0.03 ng/mL (ref <0.03)
Troponin I: <0.03 ng/mL (ref <0.03)

## 2017-01-12 LAB — COMPREHENSIVE METABOLIC PANEL
ALBUMIN: 3.5 g/dL (ref 3.5–5.0)
ALT: 14 U/L — AB (ref 17–63)
AST: 33 U/L (ref 15–41)
Alkaline Phosphatase: 58 U/L (ref 38–126)
Anion gap: 9 (ref 5–15)
BUN: 16 mg/dL (ref 6–20)
CO2: 24 mmol/L (ref 22–32)
CREATININE: 0.9 mg/dL (ref 0.61–1.24)
Calcium: 7.9 mg/dL — ABNORMAL LOW (ref 8.9–10.3)
Chloride: 105 mmol/L (ref 101–111)
GFR calc Af Amer: >60 mL/min (ref >60)
GLUCOSE: 138 mg/dL — AB (ref 65–99)
Potassium: 3.9 mmol/L (ref 3.5–5.1)
Sodium: 138 mmol/L (ref 135–145)
Total Bilirubin: 3.2 mg/dL — ABNORMAL HIGH (ref 0.3–1.2)
Total Protein: 6.3 g/dL — ABNORMAL LOW (ref 6.5–8.1)

## 2017-01-12 LAB — FERRITIN
FERRITIN: 116 ng/mL (ref 24–336)
Ferritin: 103 ng/mL (ref 24–336)

## 2017-01-12 LAB — PROTIME-INR
INR: 1.17
PROTHROMBIN TIME: 15 s (ref 11.4–15.2)

## 2017-01-12 LAB — PREPARE RBC (CROSSMATCH)

## 2017-01-12 LAB — FOLATE
FOLATE: 10 ng/mL (ref >5.9)
Folate: 8.6 ng/mL (ref >5.9)

## 2017-01-12 LAB — TSH: TSH: 8.444 u[IU]/mL — ABNORMAL HIGH (ref 0.350–4.500)

## 2017-01-12 LAB — HEMOGLOBIN AND HEMATOCRIT, BLOOD
HCT: 22.3 % — ABNORMAL LOW (ref 40.0–52.0)
HEMATOCRIT: 23.8 % — AB (ref 40.0–52.0)
HEMATOCRIT: 22.8 % — AB (ref 40.0–52.0)
HEMOGLOBIN: 7.9 g/dL — AB (ref 13.0–18.0)
HEMOGLOBIN: 8 g/dL — AB (ref 13.0–18.0)
Hemoglobin: 8.4 g/dL — ABNORMAL LOW (ref 13.0–18.0)

## 2017-01-12 LAB — BRAIN NATRIURETIC PEPTIDE: B Natriuretic Peptide: 69 pg/mL (ref 0.0–100.0)

## 2017-01-12 LAB — ETHANOL: Alcohol, Ethyl (B): <5 mg/dL (ref <5)

## 2017-01-12 LAB — LACTATE DEHYDROGENASE: LDH: 865 U/L — AB (ref 98–192)

## 2017-01-12 LAB — APTT: aPTT: 28 seconds (ref 24–36)

## 2017-01-12 LAB — MAGNESIUM: Magnesium: 2.1 mg/dL (ref 1.7–2.4)

## 2017-01-12 LAB — ABO/RH: ABO/RH(D): O POS

## 2017-01-12 MED ORDER — ACETAMINOPHEN 325 MG PO TABS: 650.0000 mg | ORAL_TABLET | Freq: Four times a day (QID) | ORAL | Status: DC | PRN

## 2017-01-12 MED ORDER — IPRATROPIUM BROMIDE 0.02 % IN SOLN: 0.5000 mg | Freq: Four times a day (QID) | RESPIRATORY_TRACT | Status: DC | PRN

## 2017-01-12 MED ORDER — METHIMAZOLE 5 MG PO TABS: 20.0000 mg | ORAL_TABLET | Freq: Two times a day (BID) | ORAL | Status: DC

## 2017-01-12 MED ORDER — INSULIN ASPART 100 UNIT/ML ~~LOC~~ SOLN
0.0000 [IU] | Freq: Every day | SUBCUTANEOUS | Status: DC
Start: 2017-01-12 — End: 2017-01-12

## 2017-01-12 MED ORDER — INSULIN ASPART 100 UNIT/ML ~~LOC~~ SOLN
0.0000 [IU] | SUBCUTANEOUS | Status: DC
  Administered 2017-01-12: 4 [IU] via SUBCUTANEOUS
  Administered 2017-01-12: 7 [IU] via SUBCUTANEOUS
  Administered 2017-01-12: 3 [IU] via SUBCUTANEOUS
  Administered 2017-01-12: 11 [IU] via SUBCUTANEOUS
  Administered 2017-01-13: 4 [IU] via SUBCUTANEOUS
  Filled 2017-01-12: qty 4
  Filled 2017-01-12: qty 11
  Filled 2017-01-12: qty 3
  Filled 2017-01-12: qty 4
  Filled 2017-01-12: qty 3
  Filled 2017-01-12: qty 4

## 2017-01-12 MED ORDER — ONDANSETRON HCL 4 MG PO TABS: 4.0000 mg | ORAL_TABLET | Freq: Four times a day (QID) | ORAL | Status: DC | PRN

## 2017-01-12 MED ORDER — ONDANSETRON HCL 4 MG/2ML IJ SOLN: 4.0000 mg | Freq: Four times a day (QID) | INTRAMUSCULAR | Status: DC | PRN

## 2017-01-12 MED ORDER — FLUOXETINE HCL 20 MG PO CAPS
40.0000 mg | ORAL_CAPSULE | Freq: Every day | ORAL | Status: DC
  Administered 2017-01-12 – 2017-01-13 (×2): 40 mg via ORAL
  Filled 2017-01-12 (×2): qty 2

## 2017-01-12 MED ORDER — CYANOCOBALAMIN 1000 MCG/ML IJ SOLN
1000.0000 ug | Freq: Every day | INTRAMUSCULAR | Status: DC
  Administered 2017-01-12 – 2017-01-13 (×2): 1000 ug via INTRAMUSCULAR
  Filled 2017-01-12 (×2): qty 1

## 2017-01-12 MED ORDER — PRAVASTATIN SODIUM 40 MG PO TABS
40.0000 mg | ORAL_TABLET | Freq: Every day | ORAL | Status: DC
  Administered 2017-01-12: 40 mg via ORAL
  Filled 2017-01-12: qty 1

## 2017-01-12 MED ORDER — LISINOPRIL 20 MG PO TABS
20.0000 mg | ORAL_TABLET | Freq: Every day | ORAL | Status: DC
  Administered 2017-01-12: 20 mg via ORAL
  Filled 2017-01-12: qty 1

## 2017-01-12 MED ORDER — SODIUM CHLORIDE 0.9 % IV SOLN
INTRAVENOUS | Status: DC
  Administered 2017-01-12 – 2017-01-13 (×3): via INTRAVENOUS

## 2017-01-12 MED ORDER — INSULIN ASPART 100 UNIT/ML ~~LOC~~ SOLN: 0.0000 [IU] | Freq: Three times a day (TID) | SUBCUTANEOUS | Status: DC

## 2017-01-12 MED ORDER — BISACODYL 5 MG PO TBEC: 5.0000 mg | DELAYED_RELEASE_TABLET | Freq: Every day | ORAL | Status: DC | PRN

## 2017-01-12 MED ORDER — TRAZODONE HCL 50 MG PO TABS: 50.0000 mg | ORAL_TABLET | Freq: Every evening | ORAL | Status: DC | PRN

## 2017-01-12 MED ORDER — OXYCODONE HCL 5 MG PO TABS: 5.0000 mg | ORAL_TABLET | ORAL | Status: DC | PRN

## 2017-01-12 MED ORDER — MAGNESIUM CITRATE PO SOLN
1.0000 | Freq: Once | ORAL | Status: DC | PRN
  Filled 2017-01-12: qty 296

## 2017-01-12 MED ORDER — LORAZEPAM 2 MG/ML IJ SOLN: 0.0000 mg | Freq: Four times a day (QID) | INTRAMUSCULAR | Status: DC

## 2017-01-12 MED ORDER — SODIUM CHLORIDE 0.9 % IV SOLN
Freq: Once | INTRAVENOUS | Status: AC
  Administered 2017-01-12: 04:00:00 via INTRAVENOUS

## 2017-01-12 MED ORDER — ALBUTEROL SULFATE (2.5 MG/3ML) 0.083% IN NEBU: 2.5000 mg | INHALATION_SOLUTION | Freq: Four times a day (QID) | RESPIRATORY_TRACT | Status: DC | PRN

## 2017-01-12 MED ORDER — PANTOPRAZOLE SODIUM 40 MG IV SOLR
40.0000 mg | Freq: Two times a day (BID) | INTRAVENOUS | Status: DC
  Administered 2017-01-12 – 2017-01-13 (×3): 40 mg via INTRAVENOUS
  Filled 2017-01-12 (×3): qty 40

## 2017-01-12 MED ORDER — ACETAMINOPHEN 650 MG RE SUPP: 650.0000 mg | Freq: Four times a day (QID) | RECTAL | Status: DC | PRN

## 2017-01-12 MED ORDER — SENNOSIDES-DOCUSATE SODIUM 8.6-50 MG PO TABS: 1.0000 | ORAL_TABLET | Freq: Every evening | ORAL | Status: DC | PRN

## 2017-01-12 MED ORDER — LORAZEPAM 2 MG/ML IJ SOLN: 0.0000 mg | Freq: Two times a day (BID) | INTRAMUSCULAR | Status: DC

## 2017-01-12 NOTE — Consult Note (Addendum)
Jonathon Bellows MD  [address] [place], [place] [address] Phone: [phone] Fax : [phone]  Consultation  Referring Provider:     Dr Margaretmary Eddy Primary Care Physician:  [name], MD Primary Gastroenterologist:           Reason for Consultation:     Anemia/GI bleed   Date of Admission:  01/11/2017 Date of Consultation:  01/12/2017         HPI:   ARIN PERAL is a 71 y.o. male with multiple past medical issues as listed in [place] below. Came into the hospital early this morning for shortness of breath, weakness , history suggests poor food intake as he has no money and no access to foods. Accidental large dose of insulin taken and was hypoglycemic.   On admission noted to have a low Hb of 7.9 grams with MCV of 122 . Hb in 2016 was 13.6 grams and in 06/2016 was 12.2 grams with mcv of 111. Iron % saturation is 78% with normal serum iron, normal ferritin, B 12 very low <50  . INR 1.17 , normal folate . No blood in urine.   EGD in 2015 by Dr Tiffany Kocher showed gastric intestinal metaplasia , moderate chronic gastritis with severe atrophy. Colonoscopy showed a tubular adenoma.    He says he has no money to buy food but drinks 3 cans of beer at night to "sleep ". Denies any abdominal pain. Says on a rare occasion has blood when he passes very hard stool and the blood is seen on the toilet paper. He says his apetite is good and he is very hungry. Denies any weight loss.    Past Medical History:  Diagnosis Date  . Depression   . 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 Gastroparesis   . Graves disease   . Hypercholesterolemia   . Hypertension   . Mantle cell lymphoma ([place])   . Peripheral neuropathy [facility])     Past Surgical History:  Procedure Laterality Date  . NEPHRECTOMY     right    Prior to Admission medications   Medication Sig Start Date End Date Taking? Authorizing Provider  ACCU-CHEK AVIVA PLUS test strip USE AS INSTRUCTED. USE 1  STRIP EVERY 3 HOURS . ACCU-CHECK AVIVA PLUS TEST STRIP. DX Z30.865 01/06/17  Yes Historical Provider, MD  insulin aspart (NOVOLOG) 100 UNIT/ML injection Use as directed 04/12/13  Yes [name], MD  aspirin EC 325 MG tablet Take by mouth.    Historical Provider, MD  FLUoxetine (PROZAC) 40 MG capsule Take 40 mg by mouth daily. 01/09/15   Historical Provider, MD  fluticasone (FLOVENT HFA) 110 MCG/ACT inhaler Inhale 2 puffs into the lungs 2 (two) times daily. Patient not taking: Reported on 01/12/2017 04/24/14   [name], MD  lisinopril (PRINIVIL,ZESTRIL) 20 MG tablet TAKE 1 TABLET BY MOUTH AT BEDTIME Patient not taking: Reported on 01/12/2017 03/25/15   [name], MD  methimazole (TAPAZOLE) 10 MG tablet Take 20 mg by mouth 2 (two) times daily.    Historical Provider, MD  metoCLOPramide (REGLAN) 10 MG tablet TAKE 1 TABLET (10 MG TOTAL) BY MOUTH AT BEDTIME. Patient not taking: Reported on 01/12/2017 12/25/13   [name], MD  mupirocin ointment (BACTROBAN) 2 % Apply to affected area bid Patient not taking: Reported on 01/12/2017 12/11/15   [name], MD  pantoprazole (PROTONIX) 40 MG tablet TAKE 1 TABLET (40 MG TOTAL) BY MOUTH DAILY. Patient not taking: Reported  on 01/12/2017 12/25/13   [name], MD  pravastatin (PRAVACHOL) 40 MG tablet TAKE 1 TABLET BY MOUTH AT BEDTIME Patient not taking: Reported on 01/12/2017 01/13/16   [name], MD  sucralfate (CARAFATE) 1 G tablet TAKE 1 TABLET BY MOUTH 4 TIMES A DAY BEFORE MEALS AND AT BEDTIME FOR 30 DAYS Patient not taking: Reported on 10/22/2016 10/13/13   [name], MD  traZODone (DESYREL) 50 MG tablet Take 50 mg by mouth daily as needed. 02/26/15   Historical Provider, MD    Family History  Problem Relation Age of Onset  . Breast cancer Mother   . Diabetes Father   . Cancer Father     Lymphoma  . Alcohol abuse Maternal Uncle   . Diabetes Sister   . Heart disease      grandfather     Social History  Substance Use Topics  .  Smoking status: Former Research scientist (life sciences)  . Smokeless tobacco: Current User    Types: Chew  . Alcohol use 1.8 oz/week    3 Cans of beer per week     Comment: nightly    Allergies as of 01/11/2017 - Review Complete 01/11/2017  Allergen Reaction Noted  . Rofecoxib Nausea Only 04/09/2015    Review of Systems:    All systems reviewed and negative except where noted in HPI.   Physical Exam:  Vital signs in last 24 hours: Temp:  [98.2 F (36.8 C)-98.6 F (37 C)] 98.2 F (36.8 C) (04/03 0732) Pulse Rate:  [89-112] 89 (04/03 0732) Resp:  [16-25] 16 (04/03 0732) BP: (100-160)/(48-119) 120/61 (04/03 0732) SpO2:  [99 %-100 %] 99 % (04/03 0732) Weight:  [210 lb (95.3 kg)-212 lb 6.4 oz (96.3 kg)] 212 lb 6.4 oz (96.3 kg) (04/03 0314) Last BM Date: 01/10/17 General:   Pleasant, cooperative in NAD Head:  Normocephalic and atraumatic. Eyes:   No icterus.   Conjunctiva pink. PERRLA. Ears:  Normal auditory acuity. Neck:  Supple; no masses or thyroidomegaly, few spider angiomas  Lungs: Respirations even and unlabored. Lungs clear to auscultation bilaterally.   No wheezes, crackles, or rhonchi.  Heart:  Regular rate and rhythm;  Without murmur, clicks, rubs or gallops Abdomen:  Soft, nondistended, nontender. Normal bowel sounds. No appreciable masses or hepatomegaly.  No rebound or guarding.  Extremities:  Without edema, cyanosis or clubbing.Multiple bruises over his arms  Neurologic:  Alert and oriented x3;  grossly normal neurologically. Skin:  Intact without significant lesions or rashes. Cervical Nodes:  No significant cervical adenopathy. Psych:  Alert and cooperative. Normal affect.  LAB RESULTS:  Recent Labs  01/11/17 1858 01/12/17 0315 01/12/17 0829  WBC 4.4 4.8 3.2*  HGB 7.9* 8.5* 8.1*  HCT 22.5* 23.6* 22.9*  PLT 150 154 113*   BMET  Recent Labs  01/11/17 1858 01/12/17 0315  NA 134* 138  K 3.9 3.9  CL 102 105  CO2 24 24  GLUCOSE 177* 138*  BUN 15 16  CREATININE 0.95 0.90    CALCIUM 7.7* 7.9*   LFT  Recent Labs  01/11/17 1858 01/12/17 0315  PROT 6.1* 6.3*  ALBUMIN 3.3* 3.5  AST 33 33  ALT 13* 14*  ALKPHOS 59 58  BILITOT 2.4* 3.2*  BILIDIR 0.3  --   IBILI 2.1*  --    PT/INR  Recent Labs  01/11/17 2147 01/12/17 0315  LABPROT 15.5* 15.0  INR 1.22 1.17    STUDIES: Dg Chest 2 View  Result Date: 01/11/2017 CLINICAL DATA:  Shortness of breath  and weakness. EXAM: CHEST  2 VIEW COMPARISON:  April 17, 2014 FINDINGS: The heart size and mediastinal contours are within normal limits. There is no focal infiltrate, pulmonary edema, or pleural effusion. The visualized skeletal structures are stable. Degenerative joint changes of spine are noted. IMPRESSION: No active cardiopulmonary disease. Electronically Signed   By: Abelardo Diesel M.D.   On: 01/11/2017 19:18   US Abdomen Complete  Result Date: 01/12/2017 CLINICAL DATA:  Increased bilirubin status post right nephrectomy EXAM: ABDOMEN ULTRASOUND COMPLETE COMPARISON:  CT scan 07/06/2016 FINDINGS: Gallbladder: Surgically absent Common bile duct: Diameter: 5.4 mm in diameter within normal limits. Liver: No focal hepatic mass. There is diffuse increased echogenicity of the liver suspicious for fatty infiltration. IVC: No abnormality visualized. Pancreas: Limited assessment due to abundant bowel gas Spleen: Size and appearance within normal limits. Measures 9.8 cm in length Right Kidney: Surgically absent Left Kidney: Length: 14.2 cm. Echogenicity within normal limits. No mass or hydronephrosis visualized. Abdominal aorta: No aneurysm visualized. Measures up to 2.4 cm in diameter. Other findings: None. IMPRESSION: 1. Surgical absent gallbladder.  Normal CBD. 2. No focal hepatic mass. Diffuse increased echogenicity of the liver suspicious for fatty infiltration. 3. Surgical absent right kidney.  No left hydronephrosis. 4. No aortic aneurysm. Electronically Signed   By: Lahoma Crocker M.D.   On: 01/12/2017 09:39      Impression  / Plan:   DECKER COGDELL is a 71 y.o. y/o male with acute onset severe macrocytic anemia. No overt GI blood loss.    Impression:  1. The anemia is a macrocytic anemia with normal iron studies. He could be severely B12 deficient from poor oral intake, but it also appears that he had severe gastric atrophy per EGD in 2015 and hence he may be achlorhydric and unable to absorb vitamin B12 due to lack of acid.   2. Excess alcohol consumption   3. Malnutrition   Plan   1. Replace b12 intramuscularly and closely monitor iron studies when doing so  2. Evaluate for pernicious anemia with intrinsic factor/parietal cell antibody .  3. He will also need an EGD to rule out early gastric cancer which can occur in the setting of gastric intestinal metaplasia.  4. As he has had some rectal bleeding he would also warrant a colonoscopy , both can be done as an outpatient as he is not overtly bleeding 5. Check T4 / T3 as TSH is elevated  7. IV thiamine to prevent wernicke's encephalopathy due to poor food intake 8. I suggested him to stop drinking alcohol and start using the money to purchase food.   Thank you for involving me in the care of this patient.      LOS: 0 days   Jonathon Bellows, MD  01/12/2017, 11:02 AM
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
## 2017-01-12 NOTE — Evaluation (Signed)
Physical Therapy Evaluation Patient Details Name: Gary [name] MRN: [id number] DOB: 28-Feb-1946 Today's Date: 01/12/2017   History of Present Illness  Pt is a 71 yo male, presented to [facility] w/ progressive weakness and SOB, admitted w/ symptomatic anemia. PMH includes; carotid artery stenosis, cardiomyopathy with an EF of 40%, hypertension, insulin-dependent diabetes, hyperlipidemia, coronary artery disease, peripheral neuropathy, Graves' disease, gastroparesis, depression, GERD, mantle cell lymphoma and gall bladder removal    Clinical Impression  Pt AXOx4 upon entrance, and willing to participate in PT eval. States he has been experiencing a decline in his overall energy the past few months but is still independent in all ADLs w/o any assistive devices,he just performs them slower then normal. Pt displayed minor strength deficits 4/5 throughout his extremities but was able to transfer OOB w/ modified independence. Pt able to ambulate around the nursing station (200') initially w/ RW but able to ambulate and maintain balance w/o an AD under PT supervision w/ no noticeable changes in gait pattern or speed; no need for RW at this time. No increased WOB, chest pain or dizziness were noted during ambulation, but patient did become fatigued once he returned to his room; educated patient on energy conservation techniques. Overall patient presents w/ decreased strength and activity tolerance secondary to current medical status above, he will benefit from skilled PT to correct deficits and improve functional mobility. Recommend pt receive HHPT following acute hospitalization.     Follow Up Recommendations Home health PT    Equipment Recommendations  None recommended by PT    Recommendations for Other Services       Precautions / Restrictions Precautions Precautions: Fall Restrictions Weight Bearing Restrictions: No      Mobility  Bed Mobility Overal bed mobility: Modified Independent              General bed mobility comments: able to move to sitting at EOB w/ increased time  Transfers Overall transfer level: Modified independent Equipment used: Rolling walker (2 wheeled)             General transfer comment: able to transfer to standing w/o any difficulty or dyspnea  Ambulation/Gait Ambulation/Gait assistance: Supervision Ambulation Distance (Feet): 200 Feet Assistive device: Rolling walker (2 wheeled);None Gait Pattern/deviations: Step-through pattern Gait velocity: slow but appropriate for household ambulation Gait velocity interpretation: Below normal speed for age/gender General Gait Details: displays increased pronation during stance phase on B feet, able to ambulate w/ reciprocal gait pattern, and displayed good balance, able to commune while ambulating w/ no signs of dyspnea, also ambulated w/o RW and there was no noticeable change in gait pattern w/o AD stated he felt tired once returned to his room  Stairs            Wheelchair Mobility    Modified Rankin (Stroke Patients Only)       Balance Overall balance assessment: Needs assistance Sitting-balance support: Feet supported;No upper extremity supported Sitting balance-Leahy Scale: Good Sitting balance - Comments: able to maintain upright sitting posture w/o back support, sits w/ forward flexed posture    Standing balance support: No upper extremity supported Standing balance-Leahy Scale: Good Standing balance comment: able to maintain static balance and ambulate w/o AD, no staggering or LOB noted                             Pertinent Vitals/Pain Pain Assessment: No/denies pain    Home Living Family/patient expects to be discharged to::  Private residence Living Arrangements: Spouse/significant other;Children Available Help at Discharge: Family Type of Home: House Home Access: Stairs to enter;Ramped entrance Entrance Stairs-Rails: Right Entrance Stairs-Number of Steps: 2 Home  Layout: One level [place]: Walker - 2 wheels      Prior Function Level of Independence: Independent         Comments: pt independent in mobility and ADLs at baseline, states he has been feeling less energetic over the past months      Hand Dominance        Extremity/Trunk Assessment   Upper Extremity Assessment Upper Extremity Assessment: Generalized weakness (grossly 4/5)    Lower Extremity Assessment Lower Extremity Assessment: Generalized weakness (grossly 4/5)       Communication   Communication: No difficulties  Cognition Arousal/Alertness: Awake/alert Behavior During Therapy: WFL for tasks assessed/performed Overall Cognitive Status: Within Functional Limits for tasks assessed                                        General Comments      Exercises     Assessment/Plan    PT Assessment Patient needs continued PT services  PT Problem List Decreased strength;Decreased activity tolerance;Decreased mobility;Decreased knowledge of use of DME;Cardiopulmonary status limiting activity       PT Treatment Interventions DME instruction;Gait training;Stair training;Functional mobility training;Balance training;Therapeutic exercise;Therapeutic activities;Patient/family education    PT Goals (Current goals can be found in the Care Plan section)  Acute Rehab PT Goals Patient Stated Goal: To return home PT Goal Formulation: With patient Time For Goal Achievement: 01/26/17 Potential to Achieve Goals: Good    Frequency Min 2X/week   Barriers to discharge        Co-evaluation               End of Session Equipment Utilized During Treatment: Gait belt Activity Tolerance: Patient limited by fatigue Patient left: in chair;with call bell/phone within reach;with chair alarm set;with nursing/sitter in room Nurse Communication: Mobility status PT Visit Diagnosis: Muscle weakness (generalized) (M62.81)    Time: 9604-5409 PT Time  Calculation (min) (ACUTE ONLY): 20 min   Charges:         PT G Codes:        [employer] Student PT 01/12/17, 10:39 AM [phone]   Finneas Mathe 01/12/2017, 10:34 AM

## 2017-01-12 NOTE — Telephone Encounter (Signed)
Left message to return call to our office.  

## 2017-01-12 NOTE — Progress Notes (Signed)
[place] received a consult for Prayer for a Pt in [id number]. CH met with the Pt. Pt was alert and in good spirit. Pt talked about the religion, the problem with churches, lack of support group, and asked question about the [place] of [place] . Pt requested prayers to help him cope with his illness. [place] provided prayers, spiritual support and a ministry of presence.     01/12/17 1300  Clinical Encounter Type  Visited With Patient;Health care provider  Visit Type Initial;Spiritual support  Referral From Nurse  Consult/Referral To Chaplain  Spiritual Encounters  Spiritual Needs Prayer

## 2017-01-12 NOTE — ED Provider Notes (Signed)
Medical screening examination/treatment/procedure(s) were conducted as a shared visit with non-physician practitioner(s) and myself.  I personally evaluated the patient during the encounter.   Briefly the patient is a 71 year old man who comes to the emergency department with megaloblastic anemia of unclear etiology. Vitamin B12 and folate labs are pending. He requires inpatient admission.   Darel Hong, MD 01/12/17 [phone]

## 2017-01-12 NOTE — Progress Notes (Signed)
Spoke with patient about insulin pump and patient agreed to turn pump off for hospital stay and allow nursing staff to check blood sugars and do the sliding scale insulin.

## 2017-01-12 NOTE — Consult Note (Signed)
[facility]  Telephone:([phone]) [phone] [phone]) [phone]  ID: [name] OB: 01/17/1946  MR#: [id number]  WSF#:[id number]  Patient Care Team: [name], MD as PCP - General (Internal Medicine)  CHIEF COMPLAINT: Worsening anemia with severe B-12 deficiency.  INTERVAL HISTORY: Patient is a 72 year old male with a past medical history significant for indolent mantle cell lymphoma who presented to the emergency room with worsening shortness of breath and weakness. Patient was found to be significantly anemic. He states he does not have any food at home but is able to have several cans of beer every night. He does not take any medications regularly although he accidentally took too much insulin and found to have a blood sugar of 26. Patient feels improved since admission. He has no neurologic complaints. He denies any fevers. He denies any chest pain or cough. He denies any nausea, vomiting, constipation, or diarrhea. He denies melena, but did admit to bright red blood in his bowel movements.  REVIEW OF SYSTEMS:   Review of Systems  Constitutional: Positive for malaise/fatigue. Negative for fever and weight loss.  Respiratory: Positive for shortness of breath. Negative for cough and hemoptysis.   Cardiovascular: Negative.  Negative for chest pain and leg swelling.  Gastrointestinal: Positive for blood in stool. Negative for abdominal pain and melena.  Genitourinary: Negative.   Musculoskeletal: Negative.   Neurological: Positive for weakness.  Psychiatric/Behavioral: Negative.  The patient is not nervous/anxious.     As per HPI. Otherwise, a complete review of systems is negative.  PAST MEDICAL HISTORY: Past Medical History:  Diagnosis Date  . Depression   . 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 Gastroparesis   . Graves disease   . Hypercholesterolemia   . Hypertension   . Mantle cell lymphoma ([place])   .  Peripheral neuropathy ([place])     PAST SURGICAL HISTORY: Past Surgical History:  Procedure Laterality Date  . NEPHRECTOMY     right    FAMILY HISTORY: Family History  Problem Relation Age of Onset  . Breast cancer Mother   . Diabetes Father   . Cancer Father     Lymphoma  . Alcohol abuse Maternal Uncle   . Diabetes Sister   . Heart disease      grandfather    ADVANCED DIRECTIVES (Y/N):  @ADVDIR @  HEALTH MAINTENANCE: Social History  Substance Use Topics  . Smoking status: Former Research scientist (life sciences)  . Smokeless tobacco: Current User    Types: Chew  . Alcohol use 1.8 oz/week    3 Cans of beer per week     Comment: nightly     Colonoscopy:  PAP:  Bone density:  Lipid panel:  Allergies  Allergen Reactions  . Rofecoxib Nausea Only    Current Facility-Administered Medications  Medication Dose Route Frequency Provider Last Rate Last Dose  . 0.9 %  sodium chloride infusion   Intravenous Continuous Alexis Hugelmeyer, DO 75 mL/hr at 01/12/17 0645    . acetaminophen (TYLENOL) tablet 650 mg  650 mg Oral Q6H PRN Alexis Hugelmeyer, DO       Or  . acetaminophen (TYLENOL) suppository 650 mg  650 mg Rectal Q6H PRN Alexis Hugelmeyer, DO      . albuterol (PROVENTIL) (2.5 MG/3ML) 0.083% nebulizer solution 2.5 mg  2.5 mg Nebulization Q6H PRN Alexis Hugelmeyer, DO      . bisacodyl (DULCOLAX) EC tablet 5 mg  5 mg Oral Daily PRN [employer], DO      .  cyanocobalamin ((VITAMIN B-12)) injection 1,000 mcg  1,000 mcg Intramuscular Daily Nicholes Mango, MD   1,000 mcg at 01/12/17 1106  . FLUoxetine (PROZAC) capsule 40 mg  40 mg Oral Daily Alexis Hugelmeyer, DO   40 mg at 01/12/17 1022  . insulin aspart (novoLOG) injection 0-20 Units  0-20 Units Subcutaneous Q4H Alexis Hugelmeyer, DO   11 Units at 01/12/17 1309  . ipratropium (ATROVENT) nebulizer solution 0.5 mg  0.5 mg Nebulization Q6H PRN Alexis Hugelmeyer, DO      . lisinopril (PRINIVIL,ZESTRIL) tablet 20 mg  20 mg Oral QHS Alexis Hugelmeyer, DO       . magnesium citrate solution 1 Bottle  1 Bottle Oral Once PRN Alexis Hugelmeyer, DO      . ondansetron (ZOFRAN) tablet 4 mg  4 mg Oral Q6H PRN Alexis Hugelmeyer, DO       Or  . ondansetron (ZOFRAN) injection 4 mg  4 mg Intravenous Q6H PRN Alexis Hugelmeyer, DO      . oxyCODONE (Oxy IR/ROXICODONE) immediate release tablet 5 mg  5 mg Oral Q4H PRN Alexis Hugelmeyer, DO      . pantoprazole (PROTONIX) injection 40 mg  40 mg Intravenous Q12H Alexis Hugelmeyer, DO   40 mg at 01/12/17 1022  . pravastatin (PRAVACHOL) tablet 40 mg  40 mg Oral QHS Alexis Hugelmeyer, DO      . senna-docusate (Senokot-S) tablet 1 tablet  1 tablet Oral QHS PRN Alexis Hugelmeyer, DO      . traZODone (DESYREL) tablet 50 mg  50 mg Oral QHS PRN Alexis Hugelmeyer, DO        OBJECTIVE: Vitals:   01/12/17 0642 01/12/17 0732  BP: 100/88 120/61  Pulse: 90 89  Resp: 16 16  Temp: 98.3 F (36.8 C) 98.2 F (36.8 C)     Body mass index is 30.48 kg/m.    ECOG FS:2 - Symptomatic, <50% confined to bed  General: Well-developed, well-nourished, no acute distress. Eyes: Pink conjunctiva, anicteric sclera. HEENT: Normocephalic, moist mucous membranes, clear oropharnyx. Lungs: Clear to auscultation bilaterally. Heart: Regular rate and rhythm. No rubs, murmurs, or gallops. Abdomen: Soft, nontender, nondistended. No organomegaly noted, normoactive bowel sounds. Musculoskeletal: No edema, cyanosis, or clubbing. Neuro: Alert, answering all questions appropriately. Cranial nerves grossly intact. Skin: No rashes or petechiae noted. Psych: Normal affect. Lymphatics: No cervical, calvicular, axillary or inguinal LAD.   LAB RESULTS:  Lab Results  Component Value Date   NA 138 01/12/2017   K 3.9 01/12/2017   CL 105 01/12/2017   CO2 24 01/12/2017   GLUCOSE 138 (H) 01/12/2017   BUN 16 01/12/2017   CREATININE 0.90 01/12/2017   CALCIUM 7.9 (L) 01/12/2017   PROT 6.3 (L) 01/12/2017   ALBUMIN 3.5 01/12/2017   AST 33 01/12/2017    ALT 14 (L) 01/12/2017   ALKPHOS 58 01/12/2017   BILITOT 3.2 (H) 01/12/2017   GFRNONAA >60 01/12/2017   GFRAA >60 01/12/2017    Lab Results  Component Value Date   WBC 3.2 (L) 01/12/2017   NEUTROABS 3.1 07/06/2016   HGB 8.1 (L) 01/12/2017   HCT 22.9 (L) 01/12/2017   MCV 113.7 (H) 01/12/2017   PLT 113 (L) 01/12/2017     STUDIES: Dg Chest 2 View  Result Date: 01/11/2017 CLINICAL DATA:  Shortness of breath and weakness. EXAM: CHEST  2 VIEW COMPARISON:  April 17, 2014 FINDINGS: The heart size and mediastinal contours are within normal limits. There is no focal infiltrate, pulmonary edema, or pleural effusion. The visualized  skeletal structures are stable. Degenerative joint changes of spine are noted. IMPRESSION: No active cardiopulmonary disease. Electronically Signed   By: Abelardo Diesel M.D.   On: 01/11/2017 19:18   US Abdomen Complete  Result Date: 01/12/2017 CLINICAL DATA:  Increased bilirubin status post right nephrectomy EXAM: ABDOMEN ULTRASOUND COMPLETE COMPARISON:  CT scan 07/06/2016 FINDINGS: Gallbladder: Surgically absent Common bile duct: Diameter: 5.4 mm in diameter within normal limits. Liver: No focal hepatic mass. There is diffuse increased echogenicity of the liver suspicious for fatty infiltration. IVC: No abnormality visualized. Pancreas: Limited assessment due to abundant bowel gas Spleen: Size and appearance within normal limits. Measures 9.8 cm in length Right Kidney: Surgically absent Left Kidney: Length: 14.2 cm. Echogenicity within normal limits. No mass or hydronephrosis visualized. Abdominal aorta: No aneurysm visualized. Measures up to 2.4 cm in diameter. Other findings: None. IMPRESSION: 1. Surgical absent gallbladder.  Normal CBD. 2. No focal hepatic mass. Diffuse increased echogenicity of the liver suspicious for fatty infiltration. 3. Surgical absent right kidney.  No left hydronephrosis. 4. No aortic aneurysm. Electronically Signed   By: Lahoma Crocker M.D.   On:  01/12/2017 09:39    ASSESSMENT: Worsening anemia with severe B-12 deficiency.  PLAN:    1. Worsening anemia: Patient noted to have severe B-12 deficiency which is likely dietary in nature. Iron stores are within normal limits. Patient has a significantly elevated MCV as well as LDH. Haptoglobin is pending, but patients with severe B-12 deficiency can have evidence of hemolysis. Patient is already received B-12 IM injection. If anemia does not improve with B-12 replacement, can consider additional workup as an outpatient. This is unlikely related to his mantle cell lymphoma, but if no improvement will order CT scans for further evaluation as an outpatient. No further intervention is needed. Please insure patient has follow-up in the [place] 2-3 weeks after discharge for further evaluation.  2. Thrombocytopenia: Mild, monitor. Patient does not have splenomegaly on ultrasound. 3. Hyperbilirubinemia: Possibly related to underlying hemolysis. Replace B-12 as above. May also be secondary to the patient's heavy alcohol intake. 4. Disposition: Patient apparently lives in poor living conditions with minimal access to food and medications. Social worker involved.  Appreciate consult, call with questions.  Lloyd Huger, MD   01/12/2017 3:26 PM

## 2017-01-12 NOTE — Progress Notes (Signed)
Hgb 8.1, spoke with MD and will not be giving 2nd unit of blood at this time.

## 2017-01-12 NOTE — Progress Notes (Signed)
Notified MD that lab called, stating that patient's hgb only qualifies for 1 unit of RBCs, then to reassess. MD stated to not change orders for 2 units.
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
## 2017-01-13 ENCOUNTER — Telehealth: Payer: Self-pay | Admitting: Internal Medicine

## 2017-01-13 LAB — TYPE AND SCREEN
ABO/RH(D): O POS
ANTIBODY SCREEN: NEGATIVE
UNIT DIVISION: 0

## 2017-01-13 LAB — URINE CULTURE

## 2017-01-13 LAB — CBC WITH DIFFERENTIAL/PLATELET
Basophils Absolute: 0 10*3/uL (ref 0–0.1)
Basophils Relative: 0 %
Eosinophils Absolute: 0.1 10*3/uL (ref 0–0.7)
Eosinophils Relative: 3 %
HCT: 23.1 % — ABNORMAL LOW (ref 40.0–52.0)
Hemoglobin: 8.1 g/dL — ABNORMAL LOW (ref 13.0–18.0)
Lymphocytes Relative: 36 %
Lymphs Abs: 1.1 10*3/uL (ref 1.0–3.6)
MCH: 40.9 pg — ABNORMAL HIGH (ref 26.0–34.0)
MCHC: 35.2 g/dL (ref 32.0–36.0)
MCV: 116.2 fL — ABNORMAL HIGH (ref 80.0–100.0)
Monocytes Absolute: 0.1 10*3/uL — ABNORMAL LOW (ref 0.2–1.0)
Monocytes Relative: 4 %
Neutro Abs: 1.7 10*3/uL (ref 1.4–6.5)
Neutrophils Relative %: 57 %
Platelets: 119 10*3/uL — ABNORMAL LOW (ref 150–440)
RBC: 1.99 MIL/uL — ABNORMAL LOW (ref 4.40–5.90)
RDW: 26.4 % — ABNORMAL HIGH (ref 11.5–14.5)
WBC: 3 10*3/uL — ABNORMAL LOW (ref 3.8–10.6)

## 2017-01-13 LAB — GLUCOSE, CAPILLARY
GLUCOSE-CAPILLARY: 59 mg/dL — AB (ref 65–99)
Glucose-Capillary: 156 mg/dL — ABNORMAL HIGH (ref 65–99)
Glucose-Capillary: 68 mg/dL (ref 65–99)
Glucose-Capillary: 108 mg/dL — ABNORMAL HIGH (ref 65–99)
Glucose-Capillary: 384 mg/dL — ABNORMAL HIGH (ref 65–99)

## 2017-01-13 LAB — BPAM RBC
BLOOD PRODUCT EXPIRATION DATE: 201804242359
ISSUE DATE / TIME: 201804030352
UNIT TYPE AND RH: 5100

## 2017-01-13 LAB — HAPTOGLOBIN

## 2017-01-13 LAB — HEMOGLOBIN A1C
Hgb A1c MFr Bld: 5.9 % — ABNORMAL HIGH (ref 4.8–5.6)
MEAN PLASMA GLUCOSE: 123 mg/dL

## 2017-01-13 LAB — INTRINSIC FACTOR ANTIBODIES: INTRINSIC FACTOR: 14.3 [AU]/ml — AB (ref 0.0–1.1)

## 2017-01-13 LAB — ANTI-PARIETAL ANTIBODY: Parietal Cell Antibody-IgG: 34 Units — ABNORMAL HIGH (ref 0.0–20.0)

## 2017-01-13 MED ORDER — INSULIN PUMP
Freq: Three times a day (TID) | SUBCUTANEOUS | Status: DC
  Administered 2017-01-13: 12:00:00 via SUBCUTANEOUS
  Filled 2017-01-13: qty 1

## 2017-01-13 MED ORDER — VITAMIN B-1 100 MG PO TABS: 100.0000 mg | ORAL_TABLET | Freq: Every day | ORAL | 0 refills | Status: DC

## 2017-01-13 MED ORDER — SENNOSIDES-DOCUSATE SODIUM 8.6-50 MG PO TABS: 1.0000 | ORAL_TABLET | Freq: Every evening | ORAL | Status: DC | PRN

## 2017-01-13 MED ORDER — LISINOPRIL 20 MG PO TABS: 20.0000 mg | ORAL_TABLET | Freq: Every day | ORAL | 0 refills | Status: DC

## 2017-01-13 NOTE — Discharge Instructions (Signed)
Follow-up with primary care physician in 2-3 days for daily B12 injections intramuscular Follow-up with endocrinology Dr. Eddie Dibbles in 2 weeks Follow-up with gastroenterology Dr. Vicente Males for outpatient EGD and colonoscopy Follow-up with oncology Dr. [name] in 2 weeks

## 2017-01-13 NOTE — Progress Notes (Signed)
[place] made a follow-up visit with the Pt. Pt was walking around the Rm when the [facility] arrived.  Pt stated he was feeling a bit better today, but was still concerned that Dc's still do not know why his blood counts are dropping. Pt stated he was feeling weak, but was still hopeful that Dc's will find out what he is suffering from. Pt requested prayers for concerns he shared, which the [facility] provided.    01/13/17 1500  Clinical Encounter Type  Visited With Patient  Visit Type Follow-up  Referral From Chaplain  Consult/Referral To Chaplain  Spiritual Encounters  Spiritual Needs Prayer
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
## 2017-01-13 NOTE — Progress Notes (Signed)
Patient is alert and oriented and able to verbalize needs. No complaint of pain at this time. VSS. PIV removed. Discharge instructions gone over with patient. Printed AVS and hard scripts given to patient. Patient verbalized understanding of all discharge instructions and follow up care. No concerns voiced at this time. Patient called son for transportation home.

## 2017-01-13 NOTE — Progress Notes (Addendum)
Inpatient Diabetes Program Recommendations  AACE/ADA: New Consensus Statement on Inpatient Glycemic Control (2015)  Target Ranges:  Prepandial:   less than 140 mg/dL      Peak postprandial:   less than 180 mg/dL (1-2 hours)      Critically ill patients:  140 - 180 mg/dL   Results for TYJAE, ISSA (MRN [id number]) as of 01/13/2017 10:25  Ref. Range 01/12/2017 03:00 01/12/2017 05:26 01/12/2017 07:20 01/12/2017 08:40 01/12/2017 10:01 01/12/2017 13:07 01/12/2017 16:31 01/12/2017 20:54 01/13/2017 01:03 01/13/2017 05:07 01/13/2017 07:45  Glucose-Capillary Latest Ref Range: 65 - 99 mg/dL 106 (H) 162 (H) 150 (H) 127 (H) 139 (H) 257 (H) 179 (H) 241 (H) 156 (H) 68 108 (H)    Admit with: Symptomatic Anemia  History: DM Type 1 (diagnosed at age 6), Mantle Cell Lymphoma  Home DM Meds: Insulin Pump (see below for settings)  Current Insulin Orders: Novolog Resistant Correction Scale/ SSI (0-20 units) Q4 hours     -Note patient normally uses Insulin Pump at home to manage his DM.  Has History of Type 1 DM diagnosed at age 68.  -Last saw Dr. Eddie Dibbles with [facility] on 11/17/16.  At that visit, no changes were made to pt's insulin pump settings.  They are as follows: Insulin Pump Settings Basal Rates: 12am- 0.8 units/hr   9am- 0.9 units/hr Total Basal Insulin per 24 hours period= 20.7 units Carbohydrate Ratio: 1 unit for every 8 Grams of Carbohydrates Correction/Sensitivity Factor: 1 unit for every 50 mg/dl above Target CBG Target CBG: 80-120 mg/dl  -Patient took insulin pump off self yesterday (04/03) at ~10am.  Has only been receiving Novolog SSI Q4 hours at present.  -Will need basal insulin added back to regimen to help keep CBGs controlled and to help prevent pt from developing DKA or can resume insulin pump since he is A&O and able to independently manage his insulin pump.  -Spoke with Dr. Margaretmary Eddy at 10:50am.  Dr. Margaretmary Eddy gave me permission to have pt restart his insulin pump today.  Patient resumed pump at  ~11am.  Has extra pump supplies at bedside.  Insulin pump orders placed and Novolog SSI SQ orders d/c'd.  -Spoke with Fanny Skates, RN caring for pt today.  Reviewed documentation of insulin pump with Allie.  Pt will need to check his CBGs with his meter as well b/c his CBG meter relays CBG data to pt's pump which helps pt bolus himself correctly for correction and food coverage.  Pt agreeable to allowing RN/NT check his CBGs with hospital meter as well.    --Will follow patient during hospitalization--  Wyn Quaker RN, MSN, CDE Diabetes Coordinator Inpatient Glycemic Control Team Team Pager: [phone] (8a-5p)

## 2017-01-13 NOTE — Telephone Encounter (Signed)
[facility] called to set up a 1 to 2 day HFU for pt. Pt was in for Megablastic anemia. Pt is scheduled for 4/5 @ 1:30.

## 2017-01-13 NOTE — Progress Notes (Signed)
PT Cancellation Note  Patient Details Name: Gary [name] MRN: [id number] DOB: Feb 08, 1946   Cancelled Treatment:     Pt offered and encouraged to participate this am.  Stated he was awaiting discharge and felt comfortable with his mobility.  Will continue as appropriate.   Chesley Noon 01/13/2017, 11:40 AM

## 2017-01-14 ENCOUNTER — Ambulatory Visit (INDEPENDENT_AMBULATORY_CARE_PROVIDER_SITE_OTHER): Payer: Medicare Other | Admitting: Internal Medicine

## 2017-01-14 ENCOUNTER — Telehealth: Payer: Self-pay | Admitting: *Deleted

## 2017-01-14 ENCOUNTER — Encounter: Payer: Self-pay | Admitting: Internal Medicine

## 2017-01-14 VITALS — BP 116/68 | HR 88 | Temp 98.0°F | Resp 18 | Wt 221.1 lb

## 2017-01-14 DIAGNOSIS — E7800 Pure hypercholesterolemia, unspecified: Secondary | ICD-10-CM

## 2017-01-14 DIAGNOSIS — C8310 Mantle cell lymphoma, unspecified site: Secondary | ICD-10-CM

## 2017-01-14 DIAGNOSIS — I2510 Atherosclerotic heart disease of native coronary artery without angina pectoris: Secondary | ICD-10-CM

## 2017-01-14 DIAGNOSIS — K219 Gastro-esophageal reflux disease without esophagitis: Secondary | ICD-10-CM

## 2017-01-14 DIAGNOSIS — E1065 Type 1 diabetes mellitus with hyperglycemia: Secondary | ICD-10-CM | POA: Diagnosis not present

## 2017-01-14 DIAGNOSIS — F439 Reaction to severe stress, unspecified: Secondary | ICD-10-CM | POA: Diagnosis not present

## 2017-01-14 DIAGNOSIS — E079 Disorder of thyroid, unspecified: Secondary | ICD-10-CM | POA: Diagnosis not present

## 2017-01-14 DIAGNOSIS — D649 Anemia, unspecified: Secondary | ICD-10-CM

## 2017-01-14 DIAGNOSIS — R0602 Shortness of breath: Secondary | ICD-10-CM

## 2017-01-14 DIAGNOSIS — I10 Essential (primary) hypertension: Secondary | ICD-10-CM | POA: Diagnosis not present

## 2017-01-14 LAB — CBC WITH DIFFERENTIAL/PLATELET
Basophils Absolute: 0 10*3/uL (ref 0.0–0.1)
Basophils Relative: 0.5 % (ref 0.0–3.0)
EOS PCT: 1.3 % (ref 0.0–5.0)
Eosinophils Absolute: 0.1 10*3/uL (ref 0.0–0.7)
HCT: 24.1 % — ABNORMAL LOW (ref 39.0–52.0)
Lymphocytes Relative: 20.5 % (ref 12.0–46.0)
Lymphs Abs: 0.9 10*3/uL (ref 0.7–4.0)
MCHC: 34.6 g/dL (ref 30.0–36.0)
MCV: 116 fl — ABNORMAL HIGH (ref 78.0–100.0)
MONOS PCT: 6.2 % (ref 3.0–12.0)
Monocytes Absolute: 0.3 10*3/uL (ref 0.1–1.0)
NEUTROS ABS: 3 10*3/uL (ref 1.4–7.7)
Neutrophils Relative %: 71.5 % (ref 43.0–77.0)
Platelets: 128 10*3/uL — ABNORMAL LOW (ref 150.0–400.0)
RBC: 2.08 Mil/uL — ABNORMAL LOW (ref 4.22–5.81)
RDW: 25 % — ABNORMAL HIGH (ref 11.5–15.5)
WBC: 4.2 10*3/uL (ref 4.0–10.5)

## 2017-01-14 LAB — GLUCOSE, POCT (MANUAL RESULT ENTRY): POC GLUCOSE: 119 mg/dL — AB (ref 70–99)

## 2017-01-14 LAB — INTRINSIC FACTOR ANTIBODIES: INTRINSIC FACTOR: 14.8 [AU]/ml — AB (ref 0.0–1.1)

## 2017-01-14 MED ORDER — PRAVASTATIN SODIUM 40 MG PO TABS: 40.0000 mg | ORAL_TABLET | Freq: Every day | ORAL | 2 refills | Status: DC

## 2017-01-14 MED ORDER — PANTOPRAZOLE SODIUM 40 MG PO TBEC
DELAYED_RELEASE_TABLET | ORAL | 2 refills | Status: DC
Start: 2017-01-14 — End: 2017-04-15

## 2017-01-14 MED ORDER — CYANOCOBALAMIN 1000 MCG/ML IJ SOLN
1000.0000 ug | Freq: Once | INTRAMUSCULAR | Status: AC
  Administered 2017-01-14: 1000 ug via INTRAMUSCULAR

## 2017-01-14 MED ORDER — MUPIROCIN 2 % EX OINT: TOPICAL_OINTMENT | CUTANEOUS | 0 refills | Status: DC

## 2017-01-14 NOTE — Telephone Encounter (Signed)
Have attempted to cal patient have not been able to speak with patient for TCM.

## 2017-01-14 NOTE — Progress Notes (Signed)
Pre visit review using our clinic review tool, if applicable. No additional management support is needed unless otherwise documented below in the visit note. 
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
## 2017-01-14 NOTE — Telephone Encounter (Signed)
No because I could not do the call he was released and scheduled 1 day apart and I was not in office.

## 2017-01-14 NOTE — Telephone Encounter (Signed)
He is here now.  Do I charge a TCM since he is here today?  Just let me know.

## 2017-01-14 NOTE — Telephone Encounter (Signed)
Called l/m to let her know patient in office now wanted  to see what we can help with.
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
## 2017-01-15 ENCOUNTER — Encounter: Payer: Self-pay | Admitting: Internal Medicine

## 2017-01-15 ENCOUNTER — Ambulatory Visit (INDEPENDENT_AMBULATORY_CARE_PROVIDER_SITE_OTHER): Payer: Medicare Other | Admitting: *Deleted

## 2017-01-15 DIAGNOSIS — E538 Deficiency of other specified B group vitamins: Secondary | ICD-10-CM | POA: Diagnosis not present

## 2017-01-15 MED ORDER — CYANOCOBALAMIN 1000 MCG/ML IJ SOLN
1000.0000 ug | Freq: Once | INTRAMUSCULAR | Status: AC
  Administered 2017-01-15: 1000 ug via INTRAMUSCULAR

## 2017-01-15 NOTE — Progress Notes (Addendum)
Patient presented for B 2 injection to left deltoid, patient voiced no concerns and showed no signs of distress during injection.  Reviewed.  [name]

## 2017-01-15 NOTE — Assessment & Plan Note (Signed)
Followed by oncology.  Keep f/u appt.

## 2017-01-15 NOTE — Assessment & Plan Note (Signed)
Followed by Dr Eddie Dibbles.  Had the issue with low sugar prior to his hospitalization.  States he accidentally gave himself too much insulin.  Has insulin pump.  Eating better.  Discussed importance of eating regular meals and appropriate snacks.  Keep f/u with Dr Eddie Dibbles.

## 2017-01-15 NOTE — Assessment & Plan Note (Signed)
Has been seeing psychiatry previously.  Has not seen recently.  Needs to reschedule f/u with psychiatry.

## 2017-01-15 NOTE — Assessment & Plan Note (Signed)
Has been followed by Dr Eddie Dibbles. Off tapazole now.

## 2017-01-15 NOTE — Assessment & Plan Note (Signed)
Has been on no blood pressure medication.  Blood pressure is doing well.  Hold on restarting at this time.  Follow.

## 2017-01-15 NOTE — Assessment & Plan Note (Signed)
Recent admission as outlined.  Receiving B12 injections daily as recommended.  Keep f/u appt with hematology.

## 2017-01-15 NOTE — Assessment & Plan Note (Signed)
Admitted with increased sob as outlined.  Found to be anemic.  Receiving B12 injections.  Evaluated by hematology/oncology.  Has f/u planned.  Received one unit of blood.  Feels some better.  Follow.

## 2017-01-15 NOTE — Assessment & Plan Note (Signed)
Followed by Dr Nehemiah Massed.

## 2017-01-15 NOTE — Telephone Encounter (Signed)
Called and left message.  I do not mind speaking with her.  I would rather information not be left on a voice mail.  She was instructed to call office back on Monday.

## 2017-01-15 NOTE — Assessment & Plan Note (Signed)
Continue pravastatin. Follow lipid panel and liver function tests.  

## 2017-01-15 NOTE — Telephone Encounter (Signed)
Received call back from [place] wanted to know if their was anything that you wanted to make sure that was addressed with investigation. She said it was fine to leave v/m with any concerns if we are unable to get her on the phone.

## 2017-01-15 NOTE — Telephone Encounter (Signed)
Left message to return call to our office.  

## 2017-01-15 NOTE — Assessment & Plan Note (Signed)
Having no GI issues on no medication.  Given recent anemia, etc, will restart protonix.  Keep GI f/u.  Saw in hospital.  Recommended f/u EGD and colonoscopy.

## 2017-01-15 NOTE — Telephone Encounter (Signed)
See second message

## 2017-01-19 NOTE — Progress Notes (Signed)
[facility]  Patient Status: not taken under care, we have attempted to locate patient, but have not received a return call back. We did speak with EC Lina Sayre who was not aware of a referral for [facility] services. Due to the inability to locate patient, patient will not be taken under care at this time for SN, PT and MS Services. MD has been notified. Also notified Orvan July, CM.    Florene Glen 01/19/2017, 10:17 AM

## 2017-01-22 NOTE — Telephone Encounter (Signed)
Have not received call back how would you like me to proceed with this?

## 2017-01-22 NOTE — Telephone Encounter (Signed)
I called her as well and told her that I would like to speak with her.  Please leave one more message.

## 2017-01-25 NOTE — Telephone Encounter (Signed)
I have called she answered phone should be available today until around 3:30

## 2017-01-25 NOTE — Telephone Encounter (Signed)
Spoke to [place].  Questions answered.

## 2017-02-10 ENCOUNTER — Ambulatory Visit: Payer: Medicare Other | Admitting: Gastroenterology

## 2017-02-10 ENCOUNTER — Encounter: Payer: Self-pay | Admitting: Gastroenterology

## 2017-02-16 ENCOUNTER — Ambulatory Visit (INDEPENDENT_AMBULATORY_CARE_PROVIDER_SITE_OTHER): Payer: Medicare Other | Admitting: *Deleted

## 2017-02-16 DIAGNOSIS — E10649 Type 1 diabetes mellitus with hypoglycemia without coma: Secondary | ICD-10-CM | POA: Diagnosis not present

## 2017-02-16 DIAGNOSIS — E538 Deficiency of other specified B group vitamins: Secondary | ICD-10-CM

## 2017-02-16 DIAGNOSIS — E0500 Thyrotoxicosis with diffuse goiter without thyrotoxic crisis or storm: Secondary | ICD-10-CM | POA: Diagnosis not present

## 2017-02-16 MED ORDER — CYANOCOBALAMIN 1000 MCG/ML IJ SOLN
1000.0000 ug | Freq: Once | INTRAMUSCULAR | Status: AC
  Administered 2017-02-16: 1000 ug via INTRAMUSCULAR

## 2017-02-16 NOTE — Progress Notes (Addendum)
Patient presented for B 12 injection to right deltoid , patient voiced no concern  and showed no sign of distress during injection.  Reviewed.  [name]

## 2017-02-23 DIAGNOSIS — E785 Hyperlipidemia, unspecified: Secondary | ICD-10-CM | POA: Diagnosis not present

## 2017-02-23 DIAGNOSIS — E1042 Type 1 diabetes mellitus with diabetic polyneuropathy: Secondary | ICD-10-CM | POA: Diagnosis not present

## 2017-02-23 DIAGNOSIS — K319 Disease of stomach and duodenum, unspecified: Secondary | ICD-10-CM | POA: Diagnosis not present

## 2017-02-23 DIAGNOSIS — E10649 Type 1 diabetes mellitus with hypoglycemia without coma: Secondary | ICD-10-CM | POA: Diagnosis not present

## 2017-02-23 DIAGNOSIS — Z4681 Encounter for fitting and adjustment of insulin pump: Secondary | ICD-10-CM | POA: Diagnosis not present

## 2017-02-23 DIAGNOSIS — E0500 Thyrotoxicosis with diffuse goiter without thyrotoxic crisis or storm: Secondary | ICD-10-CM | POA: Diagnosis not present

## 2017-02-23 DIAGNOSIS — E1043 Type 1 diabetes mellitus with diabetic autonomic (poly)neuropathy: Secondary | ICD-10-CM | POA: Diagnosis not present

## 2017-03-12 ENCOUNTER — Telehealth: Payer: Self-pay | Admitting: Internal Medicine

## 2017-03-12 ENCOUNTER — Encounter: Payer: Medicare Other | Admitting: Internal Medicine

## 2017-03-12 NOTE — Telephone Encounter (Signed)
FYI do we need to charge?

## 2017-03-12 NOTE — Telephone Encounter (Signed)
Do not charge pt

## 2017-03-12 NOTE — Telephone Encounter (Signed)
Pt called about not being able to make his appt today due to an emergency at home. Appt is still on the schedule. Pt states he will call back to reschedule appt. Let me know if you need me to cancel appt. Thank you!

## 2017-03-17 ENCOUNTER — Inpatient Hospital Stay: Payer: Medicare Other | Attending: Oncology

## 2017-03-17 ENCOUNTER — Ambulatory Visit
Admission: RE | Admit: 2017-03-17 | Discharge: 2017-03-17 | Disposition: A | Discharge status: Home / Self Care | Payer: Medicare Other | Source: Ambulatory Visit | Attending: Oncology | Admitting: Oncology

## 2017-03-17 DIAGNOSIS — N3289 Other specified disorders of bladder: Secondary | ICD-10-CM | POA: Diagnosis not present

## 2017-03-17 DIAGNOSIS — C8318 Mantle cell lymphoma, lymph nodes of multiple sites: Secondary | ICD-10-CM | POA: Diagnosis not present

## 2017-03-17 DIAGNOSIS — N281 Cyst of kidney, acquired: Secondary | ICD-10-CM | POA: Insufficient documentation

## 2017-03-17 DIAGNOSIS — C8310 Mantle cell lymphoma, unspecified site: Secondary | ICD-10-CM | POA: Diagnosis not present

## 2017-03-17 DIAGNOSIS — J90 Pleural effusion, not elsewhere classified: Secondary | ICD-10-CM | POA: Diagnosis not present

## 2017-03-17 DIAGNOSIS — R590 Localized enlarged lymph nodes: Secondary | ICD-10-CM | POA: Insufficient documentation

## 2017-03-17 LAB — COMPREHENSIVE METABOLIC PANEL
ALBUMIN: 3.4 g/dL — AB (ref 3.5–5.0)
ALT: 9 U/L — AB (ref 17–63)
AST: 21 U/L (ref 15–41)
Alkaline Phosphatase: 77 U/L (ref 38–126)
Anion gap: 6 (ref 5–15)
BILIRUBIN TOTAL: 0.6 mg/dL (ref 0.3–1.2)
BUN: 12 mg/dL (ref 6–20)
CO2: 26 mmol/L (ref 22–32)
CREATININE: 0.96 mg/dL (ref 0.61–1.24)
Calcium: 7.9 mg/dL — ABNORMAL LOW (ref 8.9–10.3)
Chloride: 102 mmol/L (ref 101–111)
GFR calc Af Amer: >60 mL/min (ref >60)
Glucose, Bld: 222 mg/dL — ABNORMAL HIGH (ref 65–99)
POTASSIUM: 4.2 mmol/L (ref 3.5–5.1)
Sodium: 134 mmol/L — ABNORMAL LOW (ref 135–145)
TOTAL PROTEIN: 6.7 g/dL (ref 6.5–8.1)

## 2017-03-17 LAB — CBC WITH DIFFERENTIAL/PLATELET
BASOS ABS: 0.1 10*3/uL (ref 0–0.1)
BASOS PCT: 2 %
Eosinophils Absolute: 0.2 10*3/uL (ref 0–0.7)
Eosinophils Relative: 2 %
HEMATOCRIT: 36 % — AB (ref 40.0–52.0)
Hemoglobin: 11.7 g/dL — ABNORMAL LOW (ref 13.0–18.0)
LYMPHS PCT: 14 %
Lymphs Abs: 0.9 10*3/uL — ABNORMAL LOW (ref 1.0–3.6)
MCH: 26.7 pg (ref 26.0–34.0)
MCHC: 32.5 g/dL (ref 32.0–36.0)
MCV: 82.3 fL (ref 80.0–100.0)
MONO ABS: 0.5 10*3/uL (ref 0.2–1.0)
Monocytes Relative: 8 %
Neutro Abs: 4.7 10*3/uL (ref 1.4–6.5)
Neutrophils Relative %: 74 %
Platelets: 223 10*3/uL (ref 150–440)
RBC: 4.38 MIL/uL — AB (ref 4.40–5.90)
RDW: 14.6 % — ABNORMAL HIGH (ref 11.5–14.5)
WBC: 6.4 10*3/uL (ref 3.8–10.6)

## 2017-03-17 LAB — LACTATE DEHYDROGENASE: LDH: 163 U/L (ref 98–192)

## 2017-03-17 MED ORDER — IOPAMIDOL (ISOVUE-370) INJECTION 76%
100.0000 mL | Freq: Once | INTRAVENOUS | Status: AC | PRN
  Administered 2017-03-17: 100 mL via INTRAVENOUS

## 2017-03-23 ENCOUNTER — Ambulatory Visit: Payer: Medicare Other | Admitting: Oncology

## 2017-03-23 ENCOUNTER — Ambulatory Visit: Payer: Medicare Other

## 2017-03-29 NOTE — Progress Notes (Deleted)
[place]  [phone] [phone]  ID: [name] OB: 07-02-1946  MR#: [id number]  MWN#:[id number]  Patient Care Team: [name], MD as PCP - General (Internal Medicine)  CHIEF COMPLAINT: Mantle cell lymphoma.  INTERVAL HISTORY: Patient returns to clinic today for further evaluation and discussion of his imaging results. He continues to feel well and is asymptomatic. He denies any fevers, chills, or night sweats. He has a good appetite and denies weight loss. He has no neurologic complaints. He denies any chest pain or shortness of breath. He has no nausea, vomiting, constipation, or diarrhea. He has no urinary complaints. Patient offers no specific complaints today.  REVIEW OF SYSTEMS:   Review of Systems  Constitutional: Negative for diaphoresis, fever, malaise/fatigue and weight loss.  Respiratory: Negative.  Negative for cough.   Cardiovascular: Negative.  Negative for chest pain and leg swelling.  Gastrointestinal: Negative.  Negative for abdominal pain.  Genitourinary: Negative.   Musculoskeletal: Negative.   Neurological: Negative.  Negative for sensory change and weakness.  Psychiatric/Behavioral: Negative.     As per HPI. Otherwise, a complete review of systems is negative.  PAST MEDICAL HISTORY: Past Medical History:  Diagnosis Date  . Depression   . 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 Gastroparesis   . Graves disease   . Hypercholesterolemia   . Hypertension   . Mantle cell lymphoma ([place])   . Peripheral neuropathy     PAST SURGICAL HISTORY: Past Surgical History:  Procedure Laterality Date  . NEPHRECTOMY     right    FAMILY HISTORY Family History  Problem Relation Age of Onset  . Breast cancer Mother   . Diabetes Father   . Cancer Father        Lymphoma  . Alcohol abuse Maternal Uncle   . Diabetes Sister   . Heart disease Unknown    grandfather       ADVANCED DIRECTIVES:    HEALTH MAINTENANCE: Social History  Substance Use Topics  . Smoking status: Former Research scientist (life sciences)  . Smokeless tobacco: Current User    Types: Chew  . Alcohol use 1.8 oz/week    3 Cans of beer per week     Comment: nightly     Colonoscopy:  PAP:  Bone density:  Lipid panel:  Allergies  Allergen Reactions  . Rofecoxib Nausea Only    Current Outpatient Prescriptions  Medication Sig Dispense Refill  . ACCU-CHEK AVIVA PLUS test strip USE AS INSTRUCTED. USE 1 STRIP EVERY 3 HOURS . ACCU-CHECK AVIVA PLUS TEST STRIP. DX E10.649  12  . aspirin EC 325 MG tablet Take by mouth.    Marland Kitchen FLUoxetine (PROZAC) 40 MG capsule Take 40 mg by mouth daily.  0  . insulin aspart (NOVOLOG) 100 UNIT/ML injection Use as directed 1 vial 12  . mupirocin ointment (BACTROBAN) 2 % Apply to affected area bid 22 g 0  . pantoprazole (PROTONIX) 40 MG tablet TAKE 1 TABLET (40 MG TOTAL) BY MOUTH DAILY. 30 tablet 2  . pravastatin (PRAVACHOL) 40 MG tablet Take 1 tablet (40 mg total) by mouth at bedtime. 30 tablet 2  . thiamine (VITAMIN B-1) 100 MG tablet Take 1 tablet (100 mg total) by mouth daily. (Patient not taking: Reported on 01/14/2017) 90 tablet 0   No current facility-administered medications for this visit.     OBJECTIVE: There were no vitals filed for this visit.   There is no height or  weight on file to calculate BMI.    ECOG FS:0 - Asymptomatic  General: Well-developed, well-nourished, no acute distress. Eyes: Pink conjunctiva, anicteric sclera. Lungs: Clear to auscultation bilaterally. Heart: Regular rate and rhythm. No rubs, murmurs, or gallops. Abdomen: Soft, nontender, nondistended. No organomegaly noted, normoactive bowel sounds. Musculoskeletal: No edema, cyanosis, or clubbing. Neuro: Alert, answering all questions appropriately. Cranial nerves grossly intact. Skin: No rashes or petechiae noted. Psych: Normal affect. Lymphatics: No cervical, calvicular,  axillary or inguinal LAD.   LAB RESULTS:  Lab Results  Component Value Date   NA 134 (L) 03/17/2017   K 4.2 03/17/2017   CL 102 03/17/2017   CO2 26 03/17/2017   GLUCOSE 222 (H) 03/17/2017   BUN 12 03/17/2017   CREATININE 0.96 03/17/2017   CALCIUM 7.9 (L) 03/17/2017   PROT 6.7 03/17/2017   ALBUMIN 3.4 (L) 03/17/2017   AST 21 03/17/2017   ALT 9 (L) 03/17/2017   ALKPHOS 77 03/17/2017   BILITOT 0.6 03/17/2017   GFRNONAA >60 03/17/2017   GFRAA >60 03/17/2017    Lab Results  Component Value Date   WBC 6.4 03/17/2017   NEUTROABS 4.7 03/17/2017   HGB 11.7 (L) 03/17/2017   HCT 36.0 (L) 03/17/2017   MCV 82.3 03/17/2017   PLT 223 03/17/2017     STUDIES: Ct Chest W Contrast  Result Date: 03/17/2017 CLINICAL DATA:  Mantle cell lymphoma restaging EXAM: CT CHEST, ABDOMEN, AND PELVIS WITH CONTRAST TECHNIQUE: Multidetector CT imaging of the chest, abdomen and pelvis was performed following the standard protocol during bolus administration of intravenous contrast. CONTRAST:  100 cc Isovue 370 COMPARISON:  07/06/2016 FINDINGS: CT CHEST FINDINGS Cardiovascular: The heart size is normal. No pericardial effusion. Coronary artery calcification is noted. No thoracic aortic aneurysm. Mediastinum/Nodes: 12 mm precarinal lymph node on today's study was 4 mm previously. 15 mm short axis right hilar lymph node on today's exam was 9 mm short axis when I remeasure on prior study. 8 mm short axis subcarinal lymph node measured on the prior study is 16 mm short axis today. Other scattered mediastinal lymph nodes show similar slight interval progression. 7 mm subpectoral lymph node measured previously on the left is now 9 mm short axis. Index 9 mm short axis right axillary lymph node on the prior study is now 12 mm. The esophagus has normal imaging features. Lungs/Pleura: Interlobular septal thickening noted in the upper lobes bilaterally with similar interlobular opacity in the lower lungs bilaterally.  Paraseptal emphysema noted in the lungs bilaterally. Small bilateral pleural effusions are new in the interval. Musculoskeletal: Bone windows reveal no worrisome lytic or sclerotic osseous lesions. CT ABDOMEN PELVIS FINDINGS Hepatobiliary: No focal abnormality within the liver parenchyma. Gallbladder surgically absent. No intrahepatic or extrahepatic biliary dilation. Pancreas: No focal mass lesion. No dilatation of the main duct. No intraparenchymal cyst. No peripancreatic edema. Spleen: No splenomegaly. No focal mass lesion. Adrenals/Urinary Tract: No adrenal nodule or mass. Solitary left kidney evident without suspicious mass or hydronephrosis. 10 mm low-density lesion in the lower pole is stable and likely a cyst. Left ureter unremarkable. Possible minimal circumferential bladder wall thickening although the nondistended state makes assessment less reliable. Stomach/Bowel: Stomach is nondistended. No gastric wall thickening. No evidence of outlet obstruction. Duodenum is normally positioned as is the ligament of Treitz. No small bowel wall thickening. No small bowel dilatation. The terminal ileum is normal. The appendix is not visualized, but there is no edema or inflammation in the region of the cecum. No gross  colonic mass. No colonic wall thickening. No substantial diverticular change. Vascular/Lymphatic: There is abdominal aortic atherosclerosis without aneurysm. No gastrohepatic or hepatoduodenal ligament lymphadenopathy. Retroperitoneal lymphadenopathy in the abdomen has progressed. 15 mm short axis left para-aortic index lymph node measured on prior study is 18 mm today (image 89 series 2). A more dominant nodal conglomeration and the left common iliac chain was measured previously at 3.1 x 2.5 cm and now measures 3.9 x 3.0 cm. Left external iliac index lymph node measured on the prior study at 2.6 x 2.4 cm now measures 3.5 x 2.9 cm index lymph node in the right groin measured previously at 2.8 x 2.1 cm  now measures 3.3 x 2.6 cm. Reproductive: The prostate gland and seminal vesicles have normal imaging features. Other: No intraperitoneal free fluid. Musculoskeletal: Bone windows reveal no worrisome lytic or sclerotic osseous lesions. IMPRESSION: 1. Continued mild progression of abdominal and pelvic lymphadenopathy. Thoracic lymphadenopathy seen previously now shows mild progression as well. 2. Persistent mild circumferential bladder wall thickening although bladder is nondistended. 3. Tiny left renal cyst, stable. Electronically Signed   By: Misty Stanley M.D.   On: 03/17/2017 12:56   Ct Abdomen Pelvis W Contrast  Result Date: 03/17/2017 CLINICAL DATA:  Mantle cell lymphoma restaging EXAM: CT CHEST, ABDOMEN, AND PELVIS WITH CONTRAST TECHNIQUE: Multidetector CT imaging of the chest, abdomen and pelvis was performed following the standard protocol during bolus administration of intravenous contrast. CONTRAST:  100 cc Isovue 370 COMPARISON:  07/06/2016 FINDINGS: CT CHEST FINDINGS Cardiovascular: The heart size is normal. No pericardial effusion. Coronary artery calcification is noted. No thoracic aortic aneurysm. Mediastinum/Nodes: 12 mm precarinal lymph node on today's study was 4 mm previously. 15 mm short axis right hilar lymph node on today's exam was 9 mm short axis when I remeasure on prior study. 8 mm short axis subcarinal lymph node measured on the prior study is 16 mm short axis today. Other scattered mediastinal lymph nodes show similar slight interval progression. 7 mm subpectoral lymph node measured previously on the left is now 9 mm short axis. Index 9 mm short axis right axillary lymph node on the prior study is now 12 mm. The esophagus has normal imaging features. Lungs/Pleura: Interlobular septal thickening noted in the upper lobes bilaterally with similar interlobular opacity in the lower lungs bilaterally. Paraseptal emphysema noted in the lungs bilaterally. Small bilateral pleural effusions are  new in the interval. Musculoskeletal: Bone windows reveal no worrisome lytic or sclerotic osseous lesions. CT ABDOMEN PELVIS FINDINGS Hepatobiliary: No focal abnormality within the liver parenchyma. Gallbladder surgically absent. No intrahepatic or extrahepatic biliary dilation. Pancreas: No focal mass lesion. No dilatation of the main duct. No intraparenchymal cyst. No peripancreatic edema. Spleen: No splenomegaly. No focal mass lesion. Adrenals/Urinary Tract: No adrenal nodule or mass. Solitary left kidney evident without suspicious mass or hydronephrosis. 10 mm low-density lesion in the lower pole is stable and likely a cyst. Left ureter unremarkable. Possible minimal circumferential bladder wall thickening although the nondistended state makes assessment less reliable. Stomach/Bowel: Stomach is nondistended. No gastric wall thickening. No evidence of outlet obstruction. Duodenum is normally positioned as is the ligament of Treitz. No small bowel wall thickening. No small bowel dilatation. The terminal ileum is normal. The appendix is not visualized, but there is no edema or inflammation in the region of the cecum. No gross colonic mass. No colonic wall thickening. No substantial diverticular change. Vascular/Lymphatic: There is abdominal aortic atherosclerosis without aneurysm. No gastrohepatic or hepatoduodenal ligament lymphadenopathy.  Retroperitoneal lymphadenopathy in the abdomen has progressed. 15 mm short axis left para-aortic index lymph node measured on prior study is 18 mm today (image 89 series 2). A more dominant nodal conglomeration and the left common iliac chain was measured previously at 3.1 x 2.5 cm and now measures 3.9 x 3.0 cm. Left external iliac index lymph node measured on the prior study at 2.6 x 2.4 cm now measures 3.5 x 2.9 cm index lymph node in the right groin measured previously at 2.8 x 2.1 cm now measures 3.3 x 2.6 cm. Reproductive: The prostate gland and seminal vesicles have normal  imaging features. Other: No intraperitoneal free fluid. Musculoskeletal: Bone windows reveal no worrisome lytic or sclerotic osseous lesions. IMPRESSION: 1. Continued mild progression of abdominal and pelvic lymphadenopathy. Thoracic lymphadenopathy seen previously now shows mild progression as well. 2. Persistent mild circumferential bladder wall thickening although bladder is nondistended. 3. Tiny left renal cyst, stable. Electronically Signed   By: Misty Stanley M.D.   On: 03/17/2017 12:56    ASSESSMENT: Low-grade mantle cell lymphoma diagnosed in September 2015.  PLAN:    1. Mantle cell lymphoma: CT scans reviewed independently and reported as above with persistent and slight interval progression of known adenopathy consistent with mantle cell lymphoma. No intervention is needed at this time. Patient has not required treatment at this point, but if he had progressive disease would consider Rituxan plus Treanda. No bone marrow biopsy has been performed. This was previously discussed and determined it would not change overall treatment or prognosis. Return to clinic in 6 months with repeat imaging and further evaluation. 2. Decreased ejection fraction: Previously patient had an ejection fraction of 40% with no clinical CHF. Monitor. 3. Diabetes: Continue insulin pump as directed. 4. Thrombocytopenia: Mild, monitor.  Patient expressed understanding and was in agreement with this plan. He also understands that He can call clinic at any time with any questions, concerns, or complaints.    Lloyd Huger, MD   03/29/2017 9:44 PM

## 2017-03-30 ENCOUNTER — Inpatient Hospital Stay: Payer: Medicare Other | Admitting: Oncology

## 2017-04-11 NOTE — Progress Notes (Signed)
[place]  [phone] [phone]  ID: [name] OB: 09-22-1946  MR#: [id number]  VQQ#:[id number]  Patient Care Team: [name], MD as PCP - General (Internal Medicine)  CHIEF COMPLAINT: Mantle cell lymphoma.  INTERVAL HISTORY: Patient returns to clinic today for further evaluation and discussion of his imaging results. He continues to feel well and is asymptomatic. He denies any fevers, chills, or night sweats. He has a good appetite and denies weight loss. He has no neurologic complaints. He denies any chest pain or shortness of breath. He has no nausea, vomiting, constipation, or diarrhea. He has no urinary complaints. Patient offers no specific complaints today.  REVIEW OF SYSTEMS:   Review of Systems  Constitutional: Negative for diaphoresis, fever, malaise/fatigue and weight loss.  Respiratory: Negative.  Negative for cough and shortness of breath.   Cardiovascular: Negative.  Negative for chest pain and leg swelling.  Gastrointestinal: Negative.  Negative for abdominal pain.  Genitourinary: Negative.   Musculoskeletal: Negative.   Skin: Negative.  Negative for rash.  Neurological: Negative.  Negative for sensory change and weakness.  Psychiatric/Behavioral: Negative.  The patient is not nervous/anxious.     As per HPI. Otherwise, a complete review of systems is negative.  PAST MEDICAL HISTORY: Past Medical History:  Diagnosis Date  . Depression   . 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 Gastroparesis   . Graves disease   . Hypercholesterolemia   . Hypertension   . Mantle cell lymphoma ([place])   . Peripheral neuropathy     PAST SURGICAL HISTORY: Past Surgical History:  Procedure Laterality Date  . NEPHRECTOMY     right    FAMILY HISTORY Family History  Problem Relation Age of Onset  . Breast cancer Mother   . Diabetes Father   . Cancer Father    Lymphoma  . Alcohol abuse Maternal Uncle   . Diabetes Sister   . Heart disease Unknown        grandfather       ADVANCED DIRECTIVES:    HEALTH MAINTENANCE: Social History  Substance Use Topics  . Smoking status: Former Research scientist (life sciences)  . Smokeless tobacco: Current User    Types: Chew  . Alcohol use 1.8 oz/week    3 Cans of beer per week     Comment: nightly     Colonoscopy:  PAP:  Bone density:  Lipid panel:  Allergies  Allergen Reactions  . Rofecoxib Nausea Only    Current Outpatient Prescriptions  Medication Sig Dispense Refill  . ACCU-CHEK AVIVA PLUS test strip USE AS INSTRUCTED. USE 1 STRIP EVERY 3 HOURS . ACCU-CHECK AVIVA PLUS TEST STRIP. DX E10.649  12  . insulin aspart (NOVOLOG) 100 UNIT/ML injection Use as directed 1 vial 12  . aspirin EC 325 MG tablet Take by mouth.    Marland Kitchen FLUoxetine (PROZAC) 40 MG capsule Take 40 mg by mouth daily.  0  . mupirocin ointment (BACTROBAN) 2 % Apply to affected area bid (Patient not taking: Reported on 04/12/2017) 22 g 0  . pantoprazole (PROTONIX) 40 MG tablet TAKE 1 TABLET (40 MG TOTAL) BY MOUTH DAILY. (Patient not taking: Reported on 04/12/2017) 30 tablet 2  . pravastatin (PRAVACHOL) 40 MG tablet Take 1 tablet (40 mg total) by mouth at bedtime. (Patient not taking: Reported on 04/12/2017) 30 tablet 2  . thiamine (VITAMIN B-1) 100 MG tablet Take 1 tablet (100 mg total) by mouth daily. (Patient not taking:  Reported on 01/14/2017) 90 tablet 0   No current facility-administered medications for this visit.     OBJECTIVE: Vitals:   04/12/17 1137  BP: (!) 179/94  Pulse: (!) 101  Resp: 20  Temp: (!) 96.8 F (36 C)     Body mass index is 32.64 kg/m.    ECOG FS:0 - Asymptomatic  General: Well-developed, well-nourished, no acute distress. Eyes: Pink conjunctiva, anicteric sclera. Lungs: Clear to auscultation bilaterally. Heart: Regular rate and rhythm. No rubs, murmurs, or gallops. Abdomen: Soft, nontender, nondistended. No organomegaly noted,  normoactive bowel sounds. Musculoskeletal: No edema, cyanosis, or clubbing. Neuro: Alert, answering all questions appropriately. Cranial nerves grossly intact. Skin: No rashes or petechiae noted. Psych: Normal affect. Lymphatics: No cervical, calvicular, axillary or inguinal LAD.   LAB RESULTS:  Lab Results  Component Value Date   NA 134 (L) 03/17/2017   K 4.2 03/17/2017   CL 102 03/17/2017   CO2 26 03/17/2017   GLUCOSE 222 (H) 03/17/2017   BUN 12 03/17/2017   CREATININE 0.96 03/17/2017   CALCIUM 7.9 (L) 03/17/2017   PROT 6.7 03/17/2017   ALBUMIN 3.4 (L) 03/17/2017   AST 21 03/17/2017   ALT 9 (L) 03/17/2017   ALKPHOS 77 03/17/2017   BILITOT 0.6 03/17/2017   GFRNONAA >60 03/17/2017   GFRAA >60 03/17/2017    Lab Results  Component Value Date   WBC 6.4 03/17/2017   NEUTROABS 4.7 03/17/2017   HGB 11.7 (L) 03/17/2017   HCT 36.0 (L) 03/17/2017   MCV 82.3 03/17/2017   PLT 223 03/17/2017     STUDIES: Ct Chest W Contrast  Result Date: 03/17/2017 CLINICAL DATA:  Mantle cell lymphoma restaging EXAM: CT CHEST, ABDOMEN, AND PELVIS WITH CONTRAST TECHNIQUE: Multidetector CT imaging of the chest, abdomen and pelvis was performed following the standard protocol during bolus administration of intravenous contrast. CONTRAST:  100 cc Isovue 370 COMPARISON:  07/06/2016 FINDINGS: CT CHEST FINDINGS Cardiovascular: The heart size is normal. No pericardial effusion. Coronary artery calcification is noted. No thoracic aortic aneurysm. Mediastinum/Nodes: 12 mm precarinal lymph node on today's study was 4 mm previously. 15 mm short axis right hilar lymph node on today's exam was 9 mm short axis when I remeasure on prior study. 8 mm short axis subcarinal lymph node measured on the prior study is 16 mm short axis today. Other scattered mediastinal lymph nodes show similar slight interval progression. 7 mm subpectoral lymph node measured previously on the left is now 9 mm short axis. Index 9 mm short  axis right axillary lymph node on the prior study is now 12 mm. The esophagus has normal imaging features. Lungs/Pleura: Interlobular septal thickening noted in the upper lobes bilaterally with similar interlobular opacity in the lower lungs bilaterally. Paraseptal emphysema noted in the lungs bilaterally. Small bilateral pleural effusions are new in the interval. Musculoskeletal: Bone windows reveal no worrisome lytic or sclerotic osseous lesions. CT ABDOMEN PELVIS FINDINGS Hepatobiliary: No focal abnormality within the liver parenchyma. Gallbladder surgically absent. No intrahepatic or extrahepatic biliary dilation. Pancreas: No focal mass lesion. No dilatation of the main duct. No intraparenchymal cyst. No peripancreatic edema. Spleen: No splenomegaly. No focal mass lesion. Adrenals/Urinary Tract: No adrenal nodule or mass. Solitary left kidney evident without suspicious mass or hydronephrosis. 10 mm low-density lesion in the lower pole is stable and likely a cyst. Left ureter unremarkable. Possible minimal circumferential bladder wall thickening although the nondistended state makes assessment less reliable. Stomach/Bowel: Stomach is nondistended. No gastric wall thickening. No evidence  of outlet obstruction. Duodenum is normally positioned as is the ligament of Treitz. No small bowel wall thickening. No small bowel dilatation. The terminal ileum is normal. The appendix is not visualized, but there is no edema or inflammation in the region of the cecum. No gross colonic mass. No colonic wall thickening. No substantial diverticular change. Vascular/Lymphatic: There is abdominal aortic atherosclerosis without aneurysm. No gastrohepatic or hepatoduodenal ligament lymphadenopathy. Retroperitoneal lymphadenopathy in the abdomen has progressed. 15 mm short axis left para-aortic index lymph node measured on prior study is 18 mm today (image 89 series 2). A more dominant nodal conglomeration and the left common iliac  chain was measured previously at 3.1 x 2.5 cm and now measures 3.9 x 3.0 cm. Left external iliac index lymph node measured on the prior study at 2.6 x 2.4 cm now measures 3.5 x 2.9 cm index lymph node in the right groin measured previously at 2.8 x 2.1 cm now measures 3.3 x 2.6 cm. Reproductive: The prostate gland and seminal vesicles have normal imaging features. Other: No intraperitoneal free fluid. Musculoskeletal: Bone windows reveal no worrisome lytic or sclerotic osseous lesions. IMPRESSION: 1. Continued mild progression of abdominal and pelvic lymphadenopathy. Thoracic lymphadenopathy seen previously now shows mild progression as well. 2. Persistent mild circumferential bladder wall thickening although bladder is nondistended. 3. Tiny left renal cyst, stable. Electronically Signed   By: Misty Stanley M.D.   On: 03/17/2017 12:56   Ct Abdomen Pelvis W Contrast  Result Date: 03/17/2017 CLINICAL DATA:  Mantle cell lymphoma restaging EXAM: CT CHEST, ABDOMEN, AND PELVIS WITH CONTRAST TECHNIQUE: Multidetector CT imaging of the chest, abdomen and pelvis was performed following the standard protocol during bolus administration of intravenous contrast. CONTRAST:  100 cc Isovue 370 COMPARISON:  07/06/2016 FINDINGS: CT CHEST FINDINGS Cardiovascular: The heart size is normal. No pericardial effusion. Coronary artery calcification is noted. No thoracic aortic aneurysm. Mediastinum/Nodes: 12 mm precarinal lymph node on today's study was 4 mm previously. 15 mm short axis right hilar lymph node on today's exam was 9 mm short axis when I remeasure on prior study. 8 mm short axis subcarinal lymph node measured on the prior study is 16 mm short axis today. Other scattered mediastinal lymph nodes show similar slight interval progression. 7 mm subpectoral lymph node measured previously on the left is now 9 mm short axis. Index 9 mm short axis right axillary lymph node on the prior study is now 12 mm. The esophagus has normal  imaging features. Lungs/Pleura: Interlobular septal thickening noted in the upper lobes bilaterally with similar interlobular opacity in the lower lungs bilaterally. Paraseptal emphysema noted in the lungs bilaterally. Small bilateral pleural effusions are new in the interval. Musculoskeletal: Bone windows reveal no worrisome lytic or sclerotic osseous lesions. CT ABDOMEN PELVIS FINDINGS Hepatobiliary: No focal abnormality within the liver parenchyma. Gallbladder surgically absent. No intrahepatic or extrahepatic biliary dilation. Pancreas: No focal mass lesion. No dilatation of the main duct. No intraparenchymal cyst. No peripancreatic edema. Spleen: No splenomegaly. No focal mass lesion. Adrenals/Urinary Tract: No adrenal nodule or mass. Solitary left kidney evident without suspicious mass or hydronephrosis. 10 mm low-density lesion in the lower pole is stable and likely a cyst. Left ureter unremarkable. Possible minimal circumferential bladder wall thickening although the nondistended state makes assessment less reliable. Stomach/Bowel: Stomach is nondistended. No gastric wall thickening. No evidence of outlet obstruction. Duodenum is normally positioned as is the ligament of Treitz. No small bowel wall thickening. No small bowel dilatation. The terminal  ileum is normal. The appendix is not visualized, but there is no edema or inflammation in the region of the cecum. No gross colonic mass. No colonic wall thickening. No substantial diverticular change. Vascular/Lymphatic: There is abdominal aortic atherosclerosis without aneurysm. No gastrohepatic or hepatoduodenal ligament lymphadenopathy. Retroperitoneal lymphadenopathy in the abdomen has progressed. 15 mm short axis left para-aortic index lymph node measured on prior study is 18 mm today (image 89 series 2). A more dominant nodal conglomeration and the left common iliac chain was measured previously at 3.1 x 2.5 cm and now measures 3.9 x 3.0 cm. Left external  iliac index lymph node measured on the prior study at 2.6 x 2.4 cm now measures 3.5 x 2.9 cm index lymph node in the right groin measured previously at 2.8 x 2.1 cm now measures 3.3 x 2.6 cm. Reproductive: The prostate gland and seminal vesicles have normal imaging features. Other: No intraperitoneal free fluid. Musculoskeletal: Bone windows reveal no worrisome lytic or sclerotic osseous lesions. IMPRESSION: 1. Continued mild progression of abdominal and pelvic lymphadenopathy. Thoracic lymphadenopathy seen previously now shows mild progression as well. 2. Persistent mild circumferential bladder wall thickening although bladder is nondistended. 3. Tiny left renal cyst, stable. Electronically Signed   By: Misty Stanley M.D.   On: 03/17/2017 12:56    ASSESSMENT: Low-grade mantle cell lymphoma diagnosed in September 2015.  PLAN:    1. Mantle cell lymphoma: CT scans reviewed independently and reported as above with persistent and slight interval progression of known adenopathy consistent with mantle cell lymphoma. No intervention is needed at this time. Patient has not required treatment at this point, but if he had progressive disease would consider Rituxan plus Treanda. No bone marrow biopsy has been performed. This was previously discussed and determined it would not change overall treatment or prognosis. Return to clinic in 6 months with repeat imaging and further evaluation. 2. Decreased ejection fraction: Previously patient had an ejection fraction of 40% with no clinical CHF. Monitor. 3. Diabetes: Continue insulin pump as directed. 4. Thrombocytopenia: Resolved. 5. Hypertension: Continue treatment and evaluation per PCP.  Patient expressed understanding and was in agreement with this plan. He also understands that He can call clinic at any time with any questions, concerns, or complaints.    Lloyd Huger, MD   04/12/2017 11:40 AM

## 2017-04-12 ENCOUNTER — Inpatient Hospital Stay: Payer: Medicare Other | Attending: Oncology | Admitting: Oncology

## 2017-04-12 VITALS — BP 179/94 | HR 101 | Temp 96.8°F | Resp 20 | Wt 227.5 lb

## 2017-04-12 DIAGNOSIS — E0500 Thyrotoxicosis with diffuse goiter without thyrotoxic crisis or storm: Secondary | ICD-10-CM | POA: Diagnosis not present

## 2017-04-12 DIAGNOSIS — Z87891 Personal history of nicotine dependence: Secondary | ICD-10-CM | POA: Diagnosis not present

## 2017-04-12 DIAGNOSIS — C8310 Mantle cell lymphoma, unspecified site: Secondary | ICD-10-CM | POA: Diagnosis not present

## 2017-04-12 DIAGNOSIS — E11311 Type 2 diabetes mellitus with unspecified diabetic retinopathy with macular edema: Secondary | ICD-10-CM | POA: Diagnosis not present

## 2017-04-12 DIAGNOSIS — Z794 Long term (current) use of insulin: Secondary | ICD-10-CM | POA: Insufficient documentation

## 2017-04-12 DIAGNOSIS — Z807 Family history of other malignant neoplasms of lymphoid, hematopoietic and related tissues: Secondary | ICD-10-CM | POA: Diagnosis not present

## 2017-04-12 DIAGNOSIS — Z7982 Long term (current) use of aspirin: Secondary | ICD-10-CM | POA: Diagnosis not present

## 2017-04-12 DIAGNOSIS — E7800 Pure hypercholesterolemia, unspecified: Secondary | ICD-10-CM | POA: Insufficient documentation

## 2017-04-12 DIAGNOSIS — E1143 Type 2 diabetes mellitus with diabetic autonomic (poly)neuropathy: Secondary | ICD-10-CM | POA: Diagnosis not present

## 2017-04-12 DIAGNOSIS — I10 Essential (primary) hypertension: Secondary | ICD-10-CM | POA: Diagnosis not present

## 2017-04-12 DIAGNOSIS — Z803 Family history of malignant neoplasm of breast: Secondary | ICD-10-CM | POA: Insufficient documentation

## 2017-04-12 DIAGNOSIS — Z79899 Other long term (current) drug therapy: Secondary | ICD-10-CM | POA: Diagnosis not present

## 2017-04-12 NOTE — Progress Notes (Signed)
Patient denies any concerns today.  

## 2017-04-13 ENCOUNTER — Telehealth: Payer: Self-pay

## 2017-04-13 NOTE — Telephone Encounter (Signed)
Also needs refill on Protonix

## 2017-04-13 NOTE — Telephone Encounter (Signed)
Ok to refill protonix and pravastatin x1.  Needs appt.  Cannot keep refilling if he does not come in.

## 2017-04-13 NOTE — Telephone Encounter (Signed)
Medication: Pravastatin 40mg   Directions:1 po qd  Last given: 01/14/17 #30 Number refills: 2 Last o/v: 01/14/17 N/s 03/23/17 C/a 03/12/17 Follow up: n/a Ok to refill?

## 2017-04-15 ENCOUNTER — Other Ambulatory Visit: Payer: Self-pay

## 2017-04-15 MED ORDER — PANTOPRAZOLE SODIUM 40 MG PO TBEC: DELAYED_RELEASE_TABLET | ORAL | 0 refills | Status: DC

## 2017-04-15 MED ORDER — PRAVASTATIN SODIUM 40 MG PO TABS: 40.0000 mg | ORAL_TABLET | Freq: Every day | ORAL | 0 refills | Status: DC

## 2017-04-15 NOTE — Telephone Encounter (Signed)
Script called in with note we can not give additional refills with out o/v. I have called and l/m to call office.

## 2017-04-16 NOTE — Telephone Encounter (Signed)
Called patient back we need to make app

## 2017-04-16 NOTE — Telephone Encounter (Signed)
Pt called back returning your call. I did inform him what your note stated. Thank you!

## 2017-04-19 NOTE — Telephone Encounter (Signed)
Called patient l/m to call office letter mailed to make app or no refills.

## 2017-04-27 ENCOUNTER — Telehealth: Payer: Self-pay | Admitting: *Deleted

## 2017-04-27 NOTE — Telephone Encounter (Signed)
Called to notify Randi @ [facility] [employer] of patient statements today during phone call had to leave voicemail ask for return call.
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
## 2017-04-28 NOTE — Telephone Encounter (Signed)
Talked with Randi at adult  protective services, was advised by her patient case closed due to patient is not disable. Was advised a new case could be opened by call the Adult Protective services Hot line at [phone]. Patient dew in office to morrow for B 12 injection please advise to proceed?

## 2017-04-29 ENCOUNTER — Ambulatory Visit (INDEPENDENT_AMBULATORY_CARE_PROVIDER_SITE_OTHER): Payer: Medicare Other | Admitting: *Deleted

## 2017-04-29 DIAGNOSIS — E538 Deficiency of other specified B group vitamins: Secondary | ICD-10-CM

## 2017-04-29 MED ORDER — CYANOCOBALAMIN 1000 MCG/ML IJ SOLN
1000.0000 ug | Freq: Once | INTRAMUSCULAR | Status: AC
  Administered 2017-04-29: 1000 ug via INTRAMUSCULAR
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
## 2017-04-29 NOTE — Progress Notes (Addendum)
Patient presented for B 12 injection to left deltoid, patient voiced no concerns nor showed any signs of distress during injection.  Reviewed.  Dr Scott 
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
## 2017-04-30 NOTE — Telephone Encounter (Signed)
Pt declined help today in the office.  Declined notifying police, etc.  DSS notified.

## 2017-05-18 DIAGNOSIS — E0500 Thyrotoxicosis with diffuse goiter without thyrotoxic crisis or storm: Secondary | ICD-10-CM | POA: Diagnosis not present

## 2017-05-18 DIAGNOSIS — E10649 Type 1 diabetes mellitus with hypoglycemia without coma: Secondary | ICD-10-CM | POA: Diagnosis not present

## 2017-05-18 LAB — HEMOGLOBIN A1C: HEMOGLOBIN A1C: 7.6

## 2017-05-25 DIAGNOSIS — Z4681 Encounter for fitting and adjustment of insulin pump: Secondary | ICD-10-CM | POA: Diagnosis not present

## 2017-05-25 DIAGNOSIS — E10649 Type 1 diabetes mellitus with hypoglycemia without coma: Secondary | ICD-10-CM | POA: Diagnosis not present

## 2017-05-25 DIAGNOSIS — E0500 Thyrotoxicosis with diffuse goiter without thyrotoxic crisis or storm: Secondary | ICD-10-CM | POA: Diagnosis not present

## 2017-05-25 DIAGNOSIS — E1042 Type 1 diabetes mellitus with diabetic polyneuropathy: Secondary | ICD-10-CM | POA: Diagnosis not present

## 2017-05-25 DIAGNOSIS — E785 Hyperlipidemia, unspecified: Secondary | ICD-10-CM | POA: Diagnosis not present

## 2017-06-01 ENCOUNTER — Ambulatory Visit (INDEPENDENT_AMBULATORY_CARE_PROVIDER_SITE_OTHER): Payer: Medicare Other

## 2017-06-01 DIAGNOSIS — E538 Deficiency of other specified B group vitamins: Secondary | ICD-10-CM

## 2017-06-01 MED ORDER — CYANOCOBALAMIN 1000 MCG/ML IJ SOLN
1000.0000 ug | Freq: Once | INTRAMUSCULAR | Status: AC
  Administered 2017-06-01: 1000 ug via INTRAMUSCULAR

## 2017-06-01 NOTE — Progress Notes (Signed)
Patient came in for b12 injection, received in Right deltoid.  Patient tolerated well. Thanks  Please sign in the absence of PCP, thanks

## 2017-06-01 NOTE — Progress Notes (Signed)
Care was provided under my supervision. I agree with the management as indicated in the note.  Jovany Disano DO  

## 2017-06-02 NOTE — Telephone Encounter (Signed)
DSS called [name] and stated they will fax to office medical release for last several visit in office.
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
## 2017-07-06 ENCOUNTER — Ambulatory Visit (INDEPENDENT_AMBULATORY_CARE_PROVIDER_SITE_OTHER): Payer: Medicare Other | Admitting: *Deleted

## 2017-07-06 DIAGNOSIS — E538 Deficiency of other specified B group vitamins: Secondary | ICD-10-CM

## 2017-07-06 MED ORDER — CYANOCOBALAMIN 1000 MCG/ML IJ SOLN
1000.0000 ug | Freq: Once | INTRAMUSCULAR | Status: AC
  Administered 2017-07-06: 1000 ug via INTRAMUSCULAR

## 2017-07-06 NOTE — Progress Notes (Addendum)
Patient presented for B 12 injection to left deltoid, patient voiced no concerns nor showed any signs of distress during injection.  Reviewed.  Dr Scott 

## 2017-07-09 ENCOUNTER — Encounter: Payer: Self-pay | Admitting: Internal Medicine

## 2017-07-19 ENCOUNTER — Ambulatory Visit: Payer: Medicare Other | Admitting: Internal Medicine

## 2017-07-28 ENCOUNTER — Emergency Department: Payer: Medicare Other

## 2017-07-28 ENCOUNTER — Emergency Department
Admission: EM | Admit: 2017-07-28 | Discharge: 2017-07-28 | Disposition: A | Discharge status: Home / Self Care | Payer: Medicare Other | Attending: Emergency Medicine | Admitting: Emergency Medicine

## 2017-07-28 ENCOUNTER — Encounter: Payer: Self-pay | Admitting: Emergency Medicine

## 2017-07-28 DIAGNOSIS — Y929 Unspecified place or not applicable: Secondary | ICD-10-CM | POA: Insufficient documentation

## 2017-07-28 DIAGNOSIS — Y999 Unspecified external cause status: Secondary | ICD-10-CM | POA: Insufficient documentation

## 2017-07-28 DIAGNOSIS — W109XXA Fall (on) (from) unspecified stairs and steps, initial encounter: Secondary | ICD-10-CM | POA: Insufficient documentation

## 2017-07-28 DIAGNOSIS — S8991XA Unspecified injury of right lower leg, initial encounter: Secondary | ICD-10-CM | POA: Diagnosis present

## 2017-07-28 DIAGNOSIS — Z87891 Personal history of nicotine dependence: Secondary | ICD-10-CM | POA: Diagnosis not present

## 2017-07-28 DIAGNOSIS — Z79899 Other long term (current) drug therapy: Secondary | ICD-10-CM | POA: Diagnosis not present

## 2017-07-28 DIAGNOSIS — S299XXA Unspecified injury of thorax, initial encounter: Secondary | ICD-10-CM | POA: Diagnosis not present

## 2017-07-28 DIAGNOSIS — I10 Essential (primary) hypertension: Secondary | ICD-10-CM | POA: Insufficient documentation

## 2017-07-28 DIAGNOSIS — W19XXXA Unspecified fall, initial encounter: Secondary | ICD-10-CM | POA: Diagnosis not present

## 2017-07-28 DIAGNOSIS — S81811A Laceration without foreign body, right lower leg, initial encounter: Secondary | ICD-10-CM | POA: Diagnosis not present

## 2017-07-28 DIAGNOSIS — I509 Heart failure, unspecified: Secondary | ICD-10-CM | POA: Diagnosis not present

## 2017-07-28 DIAGNOSIS — I110 Hypertensive heart disease with heart failure: Secondary | ICD-10-CM | POA: Diagnosis not present

## 2017-07-28 DIAGNOSIS — S0990XA Unspecified injury of head, initial encounter: Secondary | ICD-10-CM | POA: Insufficient documentation

## 2017-07-28 DIAGNOSIS — I2510 Atherosclerotic heart disease of native coronary artery without angina pectoris: Secondary | ICD-10-CM | POA: Insufficient documentation

## 2017-07-28 DIAGNOSIS — E119 Type 2 diabetes mellitus without complications: Secondary | ICD-10-CM | POA: Insufficient documentation

## 2017-07-28 DIAGNOSIS — Y9301 Activity, walking, marching and hiking: Secondary | ICD-10-CM | POA: Diagnosis not present

## 2017-07-28 DIAGNOSIS — Z794 Long term (current) use of insulin: Secondary | ICD-10-CM | POA: Diagnosis not present

## 2017-07-28 DIAGNOSIS — S81819A Laceration without foreign body, unspecified lower leg, initial encounter: Secondary | ICD-10-CM | POA: Diagnosis not present

## 2017-07-28 LAB — BASIC METABOLIC PANEL
Anion gap: 7 (ref 5–15)
BUN: 10 mg/dL (ref 6–20)
CO2: 28 mmol/L (ref 22–32)
Calcium: 8.3 mg/dL — ABNORMAL LOW (ref 8.9–10.3)
Chloride: 103 mmol/L (ref 101–111)
Creatinine, Ser: 1.08 mg/dL (ref 0.61–1.24)
GFR calc Af Amer: >60 mL/min (ref >60)
GLUCOSE: 153 mg/dL — AB (ref 65–99)
POTASSIUM: 4 mmol/L (ref 3.5–5.1)
Sodium: 138 mmol/L (ref 135–145)

## 2017-07-28 LAB — URINALYSIS, COMPLETE (UACMP) WITH MICROSCOPIC
BACTERIA UA: NONE SEEN
Bilirubin Urine: NEGATIVE
GLUCOSE, UA: NEGATIVE mg/dL
HGB URINE DIPSTICK: NEGATIVE
Ketones, ur: NEGATIVE mg/dL
Nitrite: NEGATIVE
PROTEIN: NEGATIVE mg/dL
Specific Gravity, Urine: 1.01 (ref 1.005–1.030)
pH: 5 (ref 5.0–8.0)

## 2017-07-28 LAB — CBC WITH DIFFERENTIAL/PLATELET
BASOS PCT: 1 %
Basophils Absolute: 0.1 10*3/uL (ref 0–0.1)
EOS ABS: 0.1 10*3/uL (ref 0–0.7)
Eosinophils Relative: 2 %
HEMATOCRIT: 34.2 % — AB (ref 40.0–52.0)
HEMOGLOBIN: 10.3 g/dL — AB (ref 13.0–18.0)
LYMPHS ABS: 0.6 10*3/uL — AB (ref 1.0–3.6)
LYMPHS PCT: 11 %
MCH: 21.9 pg — ABNORMAL LOW (ref 26.0–34.0)
MCHC: 30 g/dL — AB (ref 32.0–36.0)
MCV: 72.9 fL — ABNORMAL LOW (ref 80.0–100.0)
Monocytes Absolute: 0.5 10*3/uL (ref 0.2–1.0)
Monocytes Relative: 9 %
NEUTROS ABS: 4.1 10*3/uL (ref 1.4–6.5)
NEUTROS PCT: 77 %
Platelets: 197 10*3/uL (ref 150–440)
RBC: 4.69 MIL/uL (ref 4.40–5.90)
RDW: 17.7 % — ABNORMAL HIGH (ref 11.5–14.5)
WBC: 5.3 10*3/uL (ref 3.8–10.6)

## 2017-07-28 LAB — HEPATIC FUNCTION PANEL
ALK PHOS: 84 U/L (ref 38–126)
ALT: 8 U/L — AB (ref 17–63)
AST: 21 U/L (ref 15–41)
Albumin: 3.2 g/dL — ABNORMAL LOW (ref 3.5–5.0)
BILIRUBIN DIRECT: 0.2 mg/dL (ref 0.1–0.5)
BILIRUBIN INDIRECT: 0.5 mg/dL (ref 0.3–0.9)
Total Bilirubin: 0.7 mg/dL (ref 0.3–1.2)
Total Protein: 6.7 g/dL (ref 6.5–8.1)

## 2017-07-28 LAB — BRAIN NATRIURETIC PEPTIDE: B Natriuretic Peptide: 465 pg/mL — ABNORMAL HIGH (ref 0.0–100.0)

## 2017-07-28 LAB — TROPONIN I: Troponin I: <0.03 ng/mL (ref <0.03)

## 2017-07-28 LAB — LIPASE, BLOOD: LIPASE: 18 U/L (ref 11–51)

## 2017-07-28 MED ORDER — LIDOCAINE HCL (PF) 1 % IJ SOLN
INTRAMUSCULAR | Status: AC
  Administered 2017-07-28: 19:00:00
  Filled 2017-07-28: qty 5

## 2017-07-28 MED ORDER — FUROSEMIDE 10 MG/ML IJ SOLN
20.0000 mg | Freq: Once | INTRAMUSCULAR | Status: AC
  Administered 2017-07-28: 20 mg via INTRAVENOUS
  Filled 2017-07-28: qty 4

## 2017-07-28 MED ORDER — BACITRACIN ZINC 500 UNIT/GM EX OINT
TOPICAL_OINTMENT | CUTANEOUS | Status: AC
  Filled 2017-07-28: qty 0.9

## 2017-07-28 MED ORDER — FUROSEMIDE 20 MG PO TABS: 20.0000 mg | ORAL_TABLET | Freq: Every day | ORAL | 0 refills | Status: DC

## 2017-07-28 NOTE — ED Provider Notes (Signed)
[facility] Emergency Department Provider Note ____________________________________________   First MD Initiated Contact with Patient 07/28/17 1514     (approximate)  I have reviewed the triage vital signs and the nursing notes.   HISTORY  Chief Complaint Fall and Laceration (right leg)    HPI Gary [name] is a 71 y.o. male with past medical history as noted below (history of cardiomyopathy with EF in the 16s but no specific history of CHF) presents with right lower leg injury after a fall, acute onset approximately an hour ago when patient was climbing up outdoor steps, associated with head injury but not associated with LOC. Patient states that he was returning from walking the dog, and went up the steps and then reached for the door handle but missed it, causing him to fall. Patient states he is not exactly sure how this happened, but denies feeling dizzy or lightheaded before, and denies any LOC during the event.  Patient also reports gradual onset of shortness of breath for the last month, chronic cough for several months, and bilateral lower extremity swelling over the last several weeks.  Past Medical History:  Diagnosis Date  . Depression   . 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 Gastroparesis   . Graves disease   . Hypercholesterolemia   . Hypertension   . Mantle cell lymphoma ([place])   . Peripheral neuropathy     Patient Active Problem List   Diagnosis Date Noted  . Symptomatic anemia 01/12/2017  . Hand laceration 12/16/2015  . Adjustment disorder with depressed mood 08/11/2015  . CAD in native artery 11/02/2014  . Swelling of left lower extremity 08/03/2014  . Mantle cell lymphoma ([place]) 08/03/2014  . History of colonic polyps 04/29/2014  . Irregular heart beat 04/22/2014  . SOB (shortness of breath) 04/22/2014  . Stress 03/21/2014  . GERD (gastroesophageal reflux disease)  10/23/2013  . Gastroparesis 06/25/2013  . Chest pain 04/13/2013  . Thyroid disease 04/13/2013  . Diabetes ([place]) 04/13/2013  . Essential hypertension, benign 04/13/2013  . Hypercholesterolemia 04/13/2013    Past Surgical History:  Procedure Laterality Date  . NEPHRECTOMY     right    Prior to Admission medications   Medication Sig Start Date End Date Taking? Authorizing Provider  ACCU-CHEK AVIVA PLUS test strip USE AS INSTRUCTED. USE 1 STRIP EVERY 3 HOURS . ACCU-CHECK AVIVA PLUS TEST STRIP. DX I71.245 01/06/17  Yes [provider]  aspirin EC 325 MG tablet Take 325 mg by mouth every 6 (six) hours as needed for mild pain.    Yes [provider]  insulin aspart (NOVOLOG) 100 UNIT/ML injection Use as directed Patient taking differently: Inject 0.5-1 Units into the skin every hour. Use as directed 04/12/13  Yes [name], MD  Multiple Vitamin (MULTIVITAMIN) tablet Take 1 tablet by mouth daily.   Yes [provider]  mupirocin ointment (BACTROBAN) 2 % Apply to affected area bid Patient not taking: Reported on 04/12/2017 01/14/17   [name], MD  pantoprazole (PROTONIX) 40 MG tablet TAKE 1 TABLET (40 MG TOTAL) BY MOUTH DAILY. Patient not taking: Reported on 07/28/2017 04/15/17   [name], MD  pravastatin (PRAVACHOL) 40 MG tablet Take 1 tablet (40 mg total) by mouth at bedtime. Will need office visit for any more refills Patient not taking: Reported on 07/28/2017 04/15/17   [name], MD  thiamine (VITAMIN B-1) 100 MG tablet Take 1 tablet (100 mg total)  by mouth daily. Patient not taking: Reported on 01/14/2017 01/13/17   Nicholes Mango, MD    Allergies Rofecoxib  Family History  Problem Relation Age of Onset  . Breast cancer Mother   . Diabetes Father   . Cancer Father        Lymphoma  . Alcohol abuse Maternal Uncle   . Diabetes Sister   . Heart disease Unknown        grandfather    Social History Social History  Substance Use Topics  .  Smoking status: Former Research scientist (life sciences)  . Smokeless tobacco: Current User    Types: Chew  . Alcohol use 1.8 oz/week    3 Cans of beer per week     Comment: nightly    Review of Systems  Constitutional: No fever/chills Eyes: No redness. ENT: No neck pain. Cardiovascular: Denies chest pain. Respiratory: Positive for shortness of breath. Gastrointestinal: No nausea, no vomiting.   Genitourinary: Negative for dysuria.  Musculoskeletal: Negative for back pain. Skin: Positive for laceration. Neurological: Negative for headache.   ____________________________________________   PHYSICAL EXAM:  VITAL SIGNS: ED Triage Vitals  Enc Vitals Group     BP 07/28/17 1503 (!) 141/84     Pulse Rate 07/28/17 1503 (!) 106     Resp 07/28/17 1503 (!) 26     Temp 07/28/17 1503 97.6 F (36.4 C)     Temp Source 07/28/17 1503 Oral     SpO2 07/28/17 1503 98 %     Weight 07/28/17 1504 235 lb (106.6 kg)     Height 07/28/17 1504 5\' 10"  (1.778 m)     Head Circumference --      Peak Flow --      Pain Score --      Pain Loc --      Pain Edu? --      Excl. in [place]? --     Constitutional: Alert and oriented. Slightly uncomfortable appearing, in no acute distress. Eyes: Conjunctivae are normal. EOMI.  PERRLA.  Head: 1cm superficial abrasion to R ear.  3cm superficial abrasion to R forehead.  Nose: No congestion/rhinnorhea. Mouth/Throat: Mucous membranes are moist.   Neck: Normal range of motion. No midline C-spine tenderness. Cardiovascular: Normal rate, regular rhythm. Grossly normal heart sounds.  Good peripheral circulation. Respiratory: Normal respiratory effort.  No retractions. Bilateral mild rales to lower and mid lung fields.  Gastrointestinal: Soft and nontender. No distention.  Genitourinary: No CVA tenderness. Musculoskeletal: No lower extremity edema.  Extremities warm and well perfused. 2cm superficial stellate laceration/skin tear to R anterior lower leg. 2+ bilat pitting edema to lower legs.    Neurologic:  Normal speech and language. No gross focal neurologic deficits are appreciated.  Skin:  Skin is warm and dry. No rash noted. Psychiatric: Mood and affect are normal. Speech and behavior are normal.  ____________________________________________   LABS (all labs ordered are listed, but only abnormal results are displayed)  Labs Reviewed  BASIC METABOLIC PANEL - Abnormal; Notable for the following:       Result Value   Glucose, Bld 153 (*)    Calcium 8.3 (*)    All other components within normal limits  BRAIN NATRIURETIC PEPTIDE - Abnormal; Notable for the following:    B Natriuretic Peptide 465.0 (*)    All other components within normal limits  CBC WITH DIFFERENTIAL/PLATELET - Abnormal; Notable for the following:    Hemoglobin 10.3 (*)    HCT 34.2 (*)    MCV 72.9 (*)  MCH 21.9 (*)    MCHC 30.0 (*)    RDW 17.7 (*)    Lymphs Abs 0.6 (*)    All other components within normal limits  HEPATIC FUNCTION PANEL - Abnormal; Notable for the following:    Albumin 3.2 (*)    ALT 8 (*)    All other components within normal limits  TROPONIN I  LIPASE, BLOOD  URINALYSIS, COMPLETE (UACMP) WITH MICROSCOPIC   ____________________________________________  EKG  ED ECG REPORT I, Arta Silence, the attending physician, personally viewed and interpreted this ECG.  Date: 07/28/2017 EKG Time: 1501 Rate: 105 Rhythm: sinus tachycardia Intervals:right bundle branch block ST/T Wave abnormalities: nonspecific Narrative Interpretation: no evidence of acute ischemia; significant change when compared to EKG of 01/14/2017  ____________________________________________  RADIOLOGY  CXR: pulmonary interstitial edema with mild bilat effusions  R tib fib: ? punctate fb (on clinical correlation pt has no laceration or evidence of fb in this area, and the area with the laceration has no fb on XR  CT head: no ICH or other acute  findings  ____________________________________________   PROCEDURES  Procedure(s) performed: No    Critical Care performed: No ____________________________________________   INITIAL IMPRESSION / ASSESSMENT AND PLAN / ED COURSE  Pertinent labs & imaging results that were available during my care of the patient were reviewed by me and considered in my medical decision making (see chart for details).  71 year old male with past medical history as noted presents with apparent mechanical fall with resulting head and right lower extremity injuries. He also reports shortness of breath, cough, and bilateral lower extremity swelling which is subacute.  Review of past medical records in Epic reveals history of cardiomyopathy with EF of 40%, hypertension, diabetes, hyperlipidemia, coronary disease, and peripheral neuropathy. He had admission in April 2018 for shortness of breath and weakness, and at that time was noted to have symptomatic microcytic anemia and hyperbilirubinemia. Patient also had social work evaluation for concern of possible on safe living conditions at home, but was cleared for discharge to his home.  Exam is as described with right lower extremity laceration, rales on lung exam, and peripheral edema. Vital signs are stable except for borderline tachycardia. Based on patient's history, the fall appears to be mechanical and there is no evidence of near syncope or syncope. Based on patient's age will obtain CT head trauma to r/o ICH, and x-ray of the right lower extremity to rule out foreign body, as patient fell through  glass. Differential for shortness breath includes acute CHF, ACS, less likely pneumonia or bronchitis; I do not suspect PE given the lung exam findings, bilateral lower extremity swelling, and history of cardiomyopathy. Plan for chest x-ray and cardiac workup.     ----------------------------------------- 6:41 PM on  07/28/2017 -----------------------------------------  Patient's trauma imaging is negative. Patient has no lac in the area of the questionable foreign body seen on x-ray, so this is not consistent with an actual foreign body.  Laceration repaired.   Chest x-ray and BNP suggestive of mild CHF.  I discussed this finding with patient and recommended that given that he has no prior diagnosis of CHF, I'm concerned for relatively new or subacute onset CHF and would recommend admission for further workup. Patient states he is adamant that he does not want to be admitted. At this time patient has borderline heart rate, but normal O2 sat and is comfortable and in no respiratory distress. He states he has follow-up with his primary care doctor next week  and would like to go home and follow up then. I explained to patient that if not appropriately treated the heart failure could get worse, he could have worsening fluid overload, worsening breathing, and this could cause permanent disability or death. I gave thorough return precautions. We will give a dose of Lasix here, and I will start patient on PO Lasix for home until he can follow-up with his primary care doctor.   Patient has not provided urine but has no urinary symptoms, no dizziness or near syncope and no other evidence for UTI or other acute urinary issue, so we will cancel.    ____________________________________________   FINAL CLINICAL IMPRESSION(S) / ED DIAGNOSES  Final diagnoses:  Acute congestive heart failure, unspecified heart failure type ([place])  Laceration of right lower extremity, initial encounter      NEW MEDICATIONS STARTED DURING THIS VISIT:  New Prescriptions   No medications on file     Note:  This document was prepared using Dragon voice recognition software and may include unintentional dictation errors.    Arta Silence, MD 07/28/17 [phone]

## 2017-07-28 NOTE — Discharge Instructions (Signed)
Return to the ER or to your regular doctor in approximately 10 days to have the stitches removed. Return to the ER immediately if you notice redness, pus drainage, rash, fevers, or any other new or worsening symptoms that concern you with the cut in your leg.  Your x-ray and blood work are suggestive of heart failure, which causes fluid to build up in your lungs. Take the medication as prescribed. You should discuss this with your doctor when you see them next week. Return to the ER for new or worsening shortness of breath, chest pain, weakness or lightheadedness, or any other new or worsening symptoms that concern you.

## 2017-07-28 NOTE — ED Triage Notes (Signed)
Pt presents to ED via EMS from home c/o fall and laceration to right leg. Pt was walking dog and lost balance.

## 2017-07-28 NOTE — ED Notes (Signed)
Pt in CT.

## 2017-08-03 ENCOUNTER — Emergency Department: Payer: Medicare Other

## 2017-08-03 ENCOUNTER — Emergency Department
Admission: EM | Admit: 2017-08-03 | Discharge: 2017-08-03 | Disposition: A | Discharge status: Home / Self Care | Payer: Medicare Other | Attending: Emergency Medicine | Admitting: Emergency Medicine

## 2017-08-03 ENCOUNTER — Ambulatory Visit: Payer: Medicare Other | Admitting: Internal Medicine

## 2017-08-03 DIAGNOSIS — Y929 Unspecified place or not applicable: Secondary | ICD-10-CM | POA: Insufficient documentation

## 2017-08-03 DIAGNOSIS — J90 Pleural effusion, not elsewhere classified: Secondary | ICD-10-CM | POA: Diagnosis not present

## 2017-08-03 DIAGNOSIS — F329 Major depressive disorder, single episode, unspecified: Secondary | ICD-10-CM | POA: Insufficient documentation

## 2017-08-03 DIAGNOSIS — Z79899 Other long term (current) drug therapy: Secondary | ICD-10-CM | POA: Diagnosis not present

## 2017-08-03 DIAGNOSIS — Z794 Long term (current) use of insulin: Secondary | ICD-10-CM | POA: Diagnosis not present

## 2017-08-03 DIAGNOSIS — M25511 Pain in right shoulder: Secondary | ICD-10-CM | POA: Diagnosis not present

## 2017-08-03 DIAGNOSIS — W010XXA Fall on same level from slipping, tripping and stumbling without subsequent striking against object, initial encounter: Secondary | ICD-10-CM | POA: Diagnosis not present

## 2017-08-03 DIAGNOSIS — I10 Essential (primary) hypertension: Secondary | ICD-10-CM | POA: Insufficient documentation

## 2017-08-03 DIAGNOSIS — I2510 Atherosclerotic heart disease of native coronary artery without angina pectoris: Secondary | ICD-10-CM | POA: Insufficient documentation

## 2017-08-03 DIAGNOSIS — S42211A Unspecified displaced fracture of surgical neck of right humerus, initial encounter for closed fracture: Secondary | ICD-10-CM | POA: Diagnosis not present

## 2017-08-03 DIAGNOSIS — Z87891 Personal history of nicotine dependence: Secondary | ICD-10-CM | POA: Diagnosis not present

## 2017-08-03 DIAGNOSIS — E119 Type 2 diabetes mellitus without complications: Secondary | ICD-10-CM | POA: Insufficient documentation

## 2017-08-03 DIAGNOSIS — Y9389 Activity, other specified: Secondary | ICD-10-CM | POA: Insufficient documentation

## 2017-08-03 DIAGNOSIS — M79601 Pain in right arm: Secondary | ICD-10-CM | POA: Diagnosis not present

## 2017-08-03 DIAGNOSIS — Y998 Other external cause status: Secondary | ICD-10-CM | POA: Insufficient documentation

## 2017-08-03 DIAGNOSIS — W19XXXA Unspecified fall, initial encounter: Secondary | ICD-10-CM | POA: Diagnosis not present

## 2017-08-03 DIAGNOSIS — S42291A Other displaced fracture of upper end of right humerus, initial encounter for closed fracture: Secondary | ICD-10-CM | POA: Diagnosis not present

## 2017-08-03 DIAGNOSIS — S42201A Unspecified fracture of upper end of right humerus, initial encounter for closed fracture: Secondary | ICD-10-CM

## 2017-08-03 DIAGNOSIS — S4991XA Unspecified injury of right shoulder and upper arm, initial encounter: Secondary | ICD-10-CM | POA: Diagnosis present

## 2017-08-03 MED ORDER — OXYCODONE-ACETAMINOPHEN 5-325 MG PO TABS: 1.0000 | ORAL_TABLET | Freq: Four times a day (QID) | ORAL | 0 refills | Status: DC | PRN

## 2017-08-03 MED ORDER — OXYCODONE-ACETAMINOPHEN 5-325 MG PO TABS
1.0000 | ORAL_TABLET | Freq: Once | ORAL | Status: AC
  Administered 2017-08-03: 1 via ORAL
  Filled 2017-08-03: qty 1

## 2017-08-03 MED ORDER — FENTANYL CITRATE (PF) 100 MCG/2ML IJ SOLN
100.0000 ug | Freq: Once | INTRAMUSCULAR | Status: AC
  Administered 2017-08-03: 100 ug via INTRAVENOUS

## 2017-08-03 MED ORDER — IBUPROFEN 600 MG PO TABS
600.0000 mg | ORAL_TABLET | Freq: Once | ORAL | Status: AC
  Administered 2017-08-03: 600 mg via ORAL
  Filled 2017-08-03: qty 1

## 2017-08-03 MED ORDER — FENTANYL CITRATE (PF) 100 MCG/2ML IJ SOLN
INTRAMUSCULAR | Status: AC
  Filled 2017-08-03: qty 2

## 2017-08-03 NOTE — ED Notes (Signed)
Arm sling applied to right arm per vo Dr Mable Paris

## 2017-08-03 NOTE — ED Provider Notes (Signed)
[facility] Emergency Department Provider Note  ____________________________________________   First MD Initiated Contact with Patient 08/03/17 1953     (approximate)  I have reviewed the triage vital signs and the nursing notes.   HISTORY  Chief Complaint Fall and Shoulder Pain   HPI Gary [name] is a 71 y.o. male who comes to the emergency department via EMS with severe sudden onset right shoulder pain that began when he was walking his dog and the dog pulled him forward and he fell onto his right shoulder. He did not hit his head. He did not lose consciousness. His pain is severe right shoulder worse with movement and improved when not moving. He denies syncope. He denies chest pain or palpitations. He denies numbness or weakness.   Past Medical History:  Diagnosis Date  . Depression   . 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 Gastroparesis   . Graves disease   . Hypercholesterolemia   . Hypertension   . Mantle cell lymphoma ([place])   . Peripheral neuropathy     Patient Active Problem List   Diagnosis Date Noted  . Symptomatic anemia 01/12/2017  . Hand laceration 12/16/2015  . Adjustment disorder with depressed mood 08/11/2015  . CAD in native artery 11/02/2014  . Swelling of left lower extremity 08/03/2014  . Mantle cell lymphoma ([place]) 08/03/2014  . History of colonic polyps 04/29/2014  . Irregular heart beat 04/22/2014  . SOB (shortness of breath) 04/22/2014  . Stress 03/21/2014  . GERD (gastroesophageal reflux disease) 10/23/2013  . Gastroparesis 06/25/2013  . Chest pain 04/13/2013  . Thyroid disease 04/13/2013  . Diabetes ([place]) 04/13/2013  . Essential hypertension, benign 04/13/2013  . Hypercholesterolemia 04/13/2013    Past Surgical History:  Procedure Laterality Date  . NEPHRECTOMY     right    Prior to Admission medications   Medication Sig Start Date End Date Taking?  Authorizing Provider  ACCU-CHEK AVIVA PLUS test strip USE AS INSTRUCTED. USE 1 STRIP EVERY 3 HOURS . ACCU-CHECK AVIVA PLUS TEST STRIP. DX B84.665 01/06/17   [provider]  aspirin EC 325 MG tablet Take 325 mg by mouth every 6 (six) hours as needed for mild pain.     [provider]  furosemide (LASIX) 20 MG tablet Take 1 tablet (20 mg total) by mouth daily. 07/28/17 07/28/18  Arta Silence, MD  insulin aspart (NOVOLOG) 100 UNIT/ML injection Use as directed Patient taking differently: Inject 0.5-1 Units into the skin every hour. Use as directed 04/12/13   [name], MD  Multiple Vitamin (MULTIVITAMIN) tablet Take 1 tablet by mouth daily.    [provider]  mupirocin ointment (BACTROBAN) 2 % Apply to affected area bid Patient not taking: Reported on 04/12/2017 01/14/17   [name], MD  oxyCODONE-acetaminophen (ROXICET) 5-325 MG tablet Take 1 tablet by mouth every 6 (six) hours as needed for severe pain. 08/03/17   Darel Hong, MD  pantoprazole (PROTONIX) 40 MG tablet TAKE 1 TABLET (40 MG TOTAL) BY MOUTH DAILY. Patient not taking: Reported on 07/28/2017 04/15/17   [name], MD  pravastatin (PRAVACHOL) 40 MG tablet Take 1 tablet (40 mg total) by mouth at bedtime. Will need office visit for any more refills Patient not taking: Reported on 07/28/2017 04/15/17   [name], MD  thiamine (VITAMIN B-1) 100 MG tablet Take 1 tablet (100 mg total) by mouth daily. Patient not taking: Reported on 01/14/2017 01/13/17  Nicholes Mango, MD    Allergies Rofecoxib  Family History  Problem Relation Age of Onset  . Breast cancer Mother   . Diabetes Father   . Cancer Father        Lymphoma  . Alcohol abuse Maternal Uncle   . Diabetes Sister   . Heart disease Unknown        grandfather    Social History Social History  Substance Use Topics  . Smoking status: Former Research scientist (life sciences)  . Smokeless tobacco: Current User    Types: Chew  . Alcohol use 1.8 oz/week    3  Cans of beer per week     Comment: nightly    Review of Systems Constitutional: No fever/chills Eyes: No visual changes. ENT: No sore throat. Cardiovascular: Denies chest pain. Respiratory: Denies shortness of breath. Gastrointestinal: No abdominal pain.  No nausea, no vomiting.  No diarrhea.  No constipation. Genitourinary: Negative for dysuria. Musculoskeletal: Negative for back pain. Skin: Negative for rash. Neurological: Negative for headaches, focal weakness or numbness.   ____________________________________________   PHYSICAL EXAM:  VITAL SIGNS: ED Triage Vitals  Enc Vitals Group     BP 08/03/17 1951 139/77     Pulse Rate 08/03/17 1951 (!) 107     Resp 08/03/17 1951 16     Temp 08/03/17 1951 (!) 97.3 F (36.3 C)     Temp Source 08/03/17 1951 Oral     SpO2 08/03/17 1951 98 %     Weight 08/03/17 1950 240 lb (108.9 kg)     Height 08/03/17 1950 5\' 10"  (1.778 m)     Head Circumference --      Peak Flow --      Pain Score 08/03/17 1950 10     Pain Loc --      Pain Edu? --      Excl. in [place]? --     Constitutional: Alert and oriented 4 holding his right arm in internal rotation adducted against his body appears extremely uncomfortable Eyes: PERRL EOMI. Head: Atraumatic. Nose: No congestion/rhinnorhea. Mouth/Throat: No trismus Neck: No stridor.   Cardiovascular: Irregularly irregular and slightly tachycardic and no murmurs appreciated Respiratory: Normal respiratory effort.  No retractions. Lungs CTAB and moving good air Gastrointestinal: Soft nontender Musculoskeletal: Severe tenderness to the proximal right humerus with no obvious deformity. Neurovascularly intact. Compartments soft.  Neurologic:  Normal speech and language. No gross focal neurologic deficits are appreciated. Skin:  Skin is warm, dry and intact. No rash noted. Psychiatric: Mood and affect are normal. Speech and behavior are normal.    ____________________________________________     DIFFERENTIAL includes but not limited to  Shoulder dislocation, humerus fracture, clavicular fracture ____________________________________________   LABS (all labs ordered are listed, but only abnormal results are displayed)  Labs Reviewed - No data to display   __________________________________________  EKG ED ECG REPORT I, Darel Hong, the attending physician, personally viewed and interpreted this ECG.  Date: 08/04/2017 EKG Time:  Rate: 108 Rhythm: Atrial fibrillation rate controlled QRS Axis: normal Intervals: Slightly prolonged QTC ST/T Wave abnormalities: normal Narrative Interpretation: Atrial fibrillation rate controlled bifascicular block no signs of acute ischemia  ____________________________________________  RADIOLOGY  Shoulder x-ray reviewed by me shows minimally displaced proximal fracture ____________________________________________   PROCEDURES  Procedure(s) performed: no  Procedures  Critical Care performed: no  Observation: no ____________________________________________   INITIAL IMPRESSION / ASSESSMENT AND PLAN / ED COURSE  Pertinent labs & imaging results that were available during my care of the patient were  reviewed by me and considered in my medical decision making (see chart for details).  The patient arrives uncomfortable appearing after a clear mechanical fall with a deformity to his right shoulder. He is able to touch his left shoulder with his right hand and clinically he does not appear to be dislocated. Favor fracture. 100 g of fentanyl given with significant pain reduction. X-ray confirms minimally displaced fracture. He feels improved after being placed in a sling. No indication for emergent operative management and he will be followed up with orthopedic surgeries and outpatient. He is discharged home with his wife in improved condition. He verbalizes understanding and agreement with the plan.       ____________________________________________   FINAL CLINICAL IMPRESSION(S) / ED DIAGNOSES  Final diagnoses:  Closed fracture of proximal end of right humerus, unspecified fracture morphology, initial encounter      NEW MEDICATIONS STARTED DURING THIS VISIT:  Discharge Medication List as of 08/03/2017  8:37 PM    START taking these medications   Details  oxyCODONE-acetaminophen (ROXICET) 5-325 MG tablet Take 1 tablet by mouth every 6 (six) hours as needed for severe pain., Starting Tue 08/03/2017, Print         Note:  This document was prepared using Dragon voice recognition software and may include unintentional dictation errors.     Darel Hong, MD 08/04/17 0010

## 2017-08-03 NOTE — ED Triage Notes (Signed)
Pt arrived via ems from home - he was walking his dog and the dog pulled him to the ground on his right shoulder - pt has obvious deformity to right shoulder - pt also stated he hit his head but denies loss of consciousness

## 2017-08-03 NOTE — Discharge Instructions (Signed)
Please make an appointment to follow-up with the orthopedic surgeon within 1 week for reevaluation. Take your pain medication as needed for severe symptoms and wearing her sling at all times. Return to the emergency department sooner for any concerns.  It was a pleasure to take care of you today, and thank you for coming to our emergency department.  If you have any questions or concerns before leaving please ask the nurse to grab me and I'm more than happy to go through your aftercare instructions again.  If you were prescribed any opioid pain medication today such as Norco, Vicodin, Percocet, morphine, hydrocodone, or oxycodone please make sure you do not drive when you are taking this medication as it can alter your ability to drive safely.  If you have any concerns once you are home that you are not improving or are in fact getting worse before you can make it to your follow-up appointment, please do not hesitate to call [phone] and come back for further evaluation.  Darel Hong, MD  Results for orders placed or performed during the hospital encounter of 60/63/01  Basic metabolic panel  Result Value Ref Range   Sodium 138 135 - 145 mmol/L   Potassium 4.0 3.5 - 5.1 mmol/L   Chloride 103 101 - 111 mmol/L   CO2 28 22 - 32 mmol/L   Glucose, Bld 153 (H) 65 - 99 mg/dL   BUN 10 6 - 20 mg/dL   Creatinine, Ser 1.08 0.61 - 1.24 mg/dL   Calcium 8.3 (L) 8.9 - 10.3 mg/dL   GFR calc non Af Amer >60 >60 mL/min   GFR calc Af Amer >60 >60 mL/min   Anion gap 7 5 - 15  Brain natriuretic peptide  Result Value Ref Range   B Natriuretic Peptide 465.0 (H) 0.0 - 100.0 pg/mL  Troponin I  Result Value Ref Range   Troponin I <0.03 <0.03 ng/mL  CBC with Differential  Result Value Ref Range   WBC 5.3 3.8 - 10.6 K/uL   RBC 4.69 4.40 - 5.90 MIL/uL   Hemoglobin 10.3 (L) 13.0 - 18.0 g/dL   HCT 34.2 (L) 40.0 - 52.0 %   MCV 72.9 (L) 80.0 - 100.0 fL   MCH 21.9 (L) 26.0 - 34.0 pg   MCHC 30.0 (L) 32.0 - 36.0 g/dL    RDW 17.7 (H) 11.5 - 14.5 %   Platelets 197 150 - 440 K/uL   Neutrophils Relative % 77 %   Neutro Abs 4.1 1.4 - 6.5 K/uL   Lymphocytes Relative 11 %   Lymphs Abs 0.6 (L) 1.0 - 3.6 K/uL   Monocytes Relative 9 %   Monocytes Absolute 0.5 0.2 - 1.0 K/uL   Eosinophils Relative 2 %   Eosinophils Absolute 0.1 0 - 0.7 K/uL   Basophils Relative 1 %   Basophils Absolute 0.1 0 - 0.1 K/uL  Urinalysis, Complete w Microscopic  Result Value Ref Range   Color, Urine YELLOW (A) YELLOW   APPearance HAZY (A) CLEAR   Specific Gravity, Urine 1.010 1.005 - 1.030   pH 5.0 5.0 - 8.0   Glucose, UA NEGATIVE NEGATIVE mg/dL   Hgb urine dipstick NEGATIVE NEGATIVE   Bilirubin Urine NEGATIVE NEGATIVE   Ketones, ur NEGATIVE NEGATIVE mg/dL   Protein, ur NEGATIVE NEGATIVE mg/dL   Nitrite NEGATIVE NEGATIVE   Leukocytes, UA TRACE (A) NEGATIVE   RBC / HPF 0-5 0 - 5 RBC/hpf   WBC, UA 0-5 0 - 5 WBC/hpf  Bacteria, UA NONE SEEN NONE SEEN   Squamous Epithelial / LPF 0-5 (A) NONE SEEN   Mucus PRESENT   Hepatic function panel  Result Value Ref Range   Total Protein 6.7 6.5 - 8.1 g/dL   Albumin 3.2 (L) 3.5 - 5.0 g/dL   AST 21 15 - 41 U/L   ALT 8 (L) 17 - 63 U/L   Alkaline Phosphatase 84 38 - 126 U/L   Total Bilirubin 0.7 0.3 - 1.2 mg/dL   Bilirubin, Direct 0.2 0.1 - 0.5 mg/dL   Indirect Bilirubin 0.5 0.3 - 0.9 mg/dL  Lipase, blood  Result Value Ref Range   Lipase 18 11 - 51 U/L   Dg Chest 2 View  Result Date: 07/28/2017 CLINICAL DATA:  Patient fell it is front door striking these the last formed door. No report of dizziness or other symptoms. Former smoker. History of diabetes and lymphoma. EXAM: CHEST  2 VIEW COMPARISON:  Chest x-ray of January 11, 2017 FINDINGS: The lungs are slightly less well inflated today. The interstitial markings are increased. The cardiac silhouette is top-normal in size. The pulmonary vascularity is indistinct and mildly engorged. Small amounts of pleural fluid are present at both lung  bases. The mediastinum is normal in width. The observed bony thorax exhibits no acute abnormality. IMPRESSION: Findings worrisome for congestive heart failure with mild pulmonary interstitial edema and small bilateral pleural effusions. When the patient can tolerate the procedure, a PA and lateral chest x-ray would be useful. Electronically Signed   By: David  Martinique M.D.   On: 07/28/2017 16:39   Dg Tibia/fibula Right  Result Date: 07/28/2017 CLINICAL DATA:  Right lower extremity laceration with glass, evaluate for foreign body. EXAM: RIGHT TIBIA AND FIBULA - 2 VIEW COMPARISON:  None. FINDINGS: Diffuse soft tissue edema. Two foci of soft tissue irregularity ventral to the mid to proximal right tibial shaft, probably correlating to the sites of soft tissue laceration. Punctate 1 mm subcutaneous radiodensity ventral to the mid right tibial shaft on the lateral view, cannot exclude a punctate foreign body. No additional potential radiopaque foreign bodies. No fracture. No suspicious focal osseous lesions. IMPRESSION: Diffuse soft tissue edema. Punctate 1 mm subcutaneous radiodensity ventral to the mid right tibial shaft on the lateral view, cannot exclude a punctate foreign body. No osseous fractures. Electronically Signed   By: Ilona Sorrel M.D.   On: 07/28/2017 16:40   Ct Head Wo Contrast  Result Date: 07/28/2017 CLINICAL DATA:  Recent fall EXAM: CT HEAD WITHOUT CONTRAST TECHNIQUE: Contiguous axial images were obtained from the base of the skull through the vertex without intravenous contrast. COMPARISON:  None. FINDINGS: Brain: No evidence of acute infarction, hemorrhage, hydrocephalus, extra-axial collection or mass lesion/mass effect. Chronic white matter ischemic changes and atrophic changes are noted. Vascular: No hyperdense vessel or unexpected calcification. Skull: Normal. Negative for fracture or focal lesion. Sinuses/Orbits: No acute finding. Other: None. IMPRESSION: Chronic atrophic and ischemic  changes without acute abnormality. Electronically Signed   By: Inez Catalina M.D.   On: 07/28/2017 16:08

## 2017-08-04 DIAGNOSIS — S42201A Unspecified fracture of upper end of right humerus, initial encounter for closed fracture: Secondary | ICD-10-CM | POA: Diagnosis not present

## 2017-08-10 ENCOUNTER — Telehealth: Payer: Self-pay | Admitting: Internal Medicine

## 2017-08-10 ENCOUNTER — Encounter: Payer: Self-pay | Admitting: Internal Medicine

## 2017-08-10 ENCOUNTER — Ambulatory Visit (INDEPENDENT_AMBULATORY_CARE_PROVIDER_SITE_OTHER): Payer: Medicare Other | Admitting: Internal Medicine

## 2017-08-10 VITALS — BP 130/72 | HR 108 | Temp 97.4°F | Ht 70.08 in | Wt 258.0 lb

## 2017-08-10 DIAGNOSIS — E1151 Type 2 diabetes mellitus with diabetic peripheral angiopathy without gangrene: Secondary | ICD-10-CM

## 2017-08-10 DIAGNOSIS — I2510 Atherosclerotic heart disease of native coronary artery without angina pectoris: Secondary | ICD-10-CM | POA: Diagnosis not present

## 2017-08-10 DIAGNOSIS — F439 Reaction to severe stress, unspecified: Secondary | ICD-10-CM | POA: Diagnosis not present

## 2017-08-10 DIAGNOSIS — E079 Disorder of thyroid, unspecified: Secondary | ICD-10-CM | POA: Diagnosis not present

## 2017-08-10 DIAGNOSIS — S42294D Other nondisplaced fracture of upper end of right humerus, subsequent encounter for fracture with routine healing: Secondary | ICD-10-CM | POA: Diagnosis not present

## 2017-08-10 DIAGNOSIS — E7800 Pure hypercholesterolemia, unspecified: Secondary | ICD-10-CM

## 2017-08-10 DIAGNOSIS — C8310 Mantle cell lymphoma, unspecified site: Secondary | ICD-10-CM | POA: Diagnosis not present

## 2017-08-10 DIAGNOSIS — M7989 Other specified soft tissue disorders: Secondary | ICD-10-CM

## 2017-08-10 DIAGNOSIS — D649 Anemia, unspecified: Secondary | ICD-10-CM | POA: Diagnosis not present

## 2017-08-10 DIAGNOSIS — E538 Deficiency of other specified B group vitamins: Secondary | ICD-10-CM | POA: Diagnosis not present

## 2017-08-10 DIAGNOSIS — I10 Essential (primary) hypertension: Secondary | ICD-10-CM | POA: Diagnosis not present

## 2017-08-10 DIAGNOSIS — R0602 Shortness of breath: Secondary | ICD-10-CM

## 2017-08-10 DIAGNOSIS — W19XXXD Unspecified fall, subsequent encounter: Secondary | ICD-10-CM

## 2017-08-10 LAB — CBC WITH DIFFERENTIAL/PLATELET
BASOS ABS: 0.1 10*3/uL (ref 0.0–0.1)
Basophils Relative: 1.2 % (ref 0.0–3.0)
Eosinophils Absolute: 0.1 10*3/uL (ref 0.0–0.7)
Eosinophils Relative: 2.7 % (ref 0.0–5.0)
HCT: 29.8 % — ABNORMAL LOW (ref 39.0–52.0)
Hemoglobin: 9.1 g/dL — ABNORMAL LOW (ref 13.0–17.0)
LYMPHS ABS: 0.6 10*3/uL — AB (ref 0.7–4.0)
Lymphocytes Relative: 11.8 % — ABNORMAL LOW (ref 12.0–46.0)
MCHC: 30.6 g/dL (ref 30.0–36.0)
MCV: 73.4 fl — AB (ref 78.0–100.0)
MONOS PCT: 11.8 % (ref 3.0–12.0)
Monocytes Absolute: 0.6 10*3/uL (ref 0.1–1.0)
NEUTROS ABS: 3.5 10*3/uL (ref 1.4–7.7)
Neutrophils Relative %: 72.5 % (ref 43.0–77.0)
PLATELETS: 199 10*3/uL (ref 150.0–400.0)
RBC: 4.06 Mil/uL — ABNORMAL LOW (ref 4.22–5.81)
RDW: 17.9 % — AB (ref 11.5–15.5)
WBC: 4.9 10*3/uL (ref 4.0–10.5)

## 2017-08-10 LAB — BASIC METABOLIC PANEL
BUN: 14 mg/dL (ref 6–23)
CALCIUM: 8.4 mg/dL (ref 8.4–10.5)
CHLORIDE: 100 meq/L (ref 96–112)
CO2: 30 meq/L (ref 19–32)
CREATININE: 0.91 mg/dL (ref 0.40–1.50)
GFR: 87.78 mL/min (ref >60.00)
GLUCOSE: 218 mg/dL — AB (ref 70–99)
Potassium: 3.7 mEq/L (ref 3.5–5.1)
Sodium: 139 mEq/L (ref 135–145)

## 2017-08-10 MED ORDER — MUPIROCIN 2 % EX OINT: TOPICAL_OINTMENT | CUTANEOUS | 0 refills | Status: DC

## 2017-08-10 MED ORDER — FUROSEMIDE 20 MG PO TABS: 20.0000 mg | ORAL_TABLET | Freq: Every day | ORAL | 1 refills | Status: DC

## 2017-08-10 MED ORDER — CYANOCOBALAMIN 1000 MCG/ML IJ SOLN
1000.0000 ug | Freq: Once | INTRAMUSCULAR | Status: AC
  Administered 2017-08-10: 1000 ug via INTRAMUSCULAR

## 2017-08-10 NOTE — Progress Notes (Signed)
Patient ID: MICKEY ESGUERRA, male   DOB: 19-Jul-1946, 71 y.o.   MRN: [id number]   Subjective:    Patient ID: [name], male    DOB: Mar 30, 1946, 71 y.o.   MRN: [id number]  HPI  Patient here for a scheduled follow up.  He was seen twice recently in the ER.  Was seen 07/28/17 after a fall.  Had left leg injury.  Note reviewed.  Was noted to have right lower extremity laceration, rales on lung exam and peripheral edema.  5 sutures placed.  CXR suggestive of mild CHF.  Was placed on lasix.  He returned to ER 08/03/17 after his dog pulled him and he fell.  Landed on his right shoulder - fractured.  Seeing ortho.  Placed in a sling.  Continues to f/u with ortho.  States since his ER visit, his lower extremity swelling has improved some.  His breathing has improved as well.  No increased cough.  No chest pain.  No abdominal pain.  Bowels moving.  Sugars per his report under good control.  Seeing Dr Eddie Dibbles.  Home situation is better.  His son is in jail.  They are eating better.  He feels this contributes to his weight gain.  States he guessed at his weight in the ER.  He is accompanied by his wife.  History obtained from both of them.     Past Medical History:  Diagnosis Date  . Depression   . 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 Gastroparesis   . Graves disease   . Hypercholesterolemia   . Hypertension   . Mantle cell lymphoma ([place])   . Peripheral neuropathy    Past Surgical History:  Procedure Laterality Date  . NEPHRECTOMY     right   Family History  Problem Relation Age of Onset  . Breast cancer Mother   . Diabetes Father   . Cancer Father        Lymphoma  . Alcohol abuse Maternal Uncle   . Diabetes Sister   . Heart disease Unknown        grandfather   Social History   Social History  . Marital status: Married    Spouse name: N/A  . Number of children: 1  . Years of education: N/A   Social History Main Topics  . Smoking  status: Former Research scientist (life sciences)  . Smokeless tobacco: Current User    Types: Chew  . Alcohol use 1.8 oz/week    3 Cans of beer per week     Comment: nightly  . Drug use: No  . Sexual activity: Not Currently   Other Topics Concern  . None   Social History Narrative  . None    Outpatient Encounter Prescriptions as of 08/10/2017  Medication Sig  . ACCU-CHEK AVIVA PLUS test strip USE AS INSTRUCTED. USE 1 STRIP EVERY 3 HOURS . ACCU-CHECK AVIVA PLUS TEST STRIP. DX S01.093  . aspirin EC 325 MG tablet Take 325 mg by mouth every 6 (six) hours as needed for mild pain.   . furosemide (LASIX) 20 MG tablet Take 1 tablet (20 mg total) by mouth daily.  . insulin aspart (NOVOLOG) 100 UNIT/ML injection Use as directed (Patient taking differently: Inject 0.5-1 Units into the skin every hour. Use as directed)  . Multiple Vitamin (MULTIVITAMIN) tablet Take 1 tablet by mouth daily.  . mupirocin ointment (BACTROBAN) 2 % Apply to affected area bid  . pantoprazole (PROTONIX) 40  MG tablet TAKE 1 TABLET (40 MG TOTAL) BY MOUTH DAILY. (Patient not taking: Reported on 07/28/2017)  . pravastatin (PRAVACHOL) 40 MG tablet Take 1 tablet (40 mg total) by mouth at bedtime. Will need office visit for any more refills (Patient not taking: Reported on 07/28/2017)  . thiamine (VITAMIN B-1) 100 MG tablet Take 1 tablet (100 mg total) by mouth daily. (Patient not taking: Reported on 01/14/2017)  . [DISCONTINUED] furosemide (LASIX) 20 MG tablet Take 1 tablet (20 mg total) by mouth daily.  . [DISCONTINUED] mupirocin ointment (BACTROBAN) 2 % Apply to affected area bid (Patient not taking: Reported on 04/12/2017)  . [DISCONTINUED] oxyCODONE-acetaminophen (ROXICET) 5-325 MG tablet Take 1 tablet by mouth every 6 (six) hours as needed for severe pain.  . [EXPIRED] cyanocobalamin ((VITAMIN B-12)) injection 1,000 mcg    No facility-administered encounter medications on file as of 08/10/2017.     Review of Systems  Constitutional:       Eating  better now.  Increased weight.    HENT: Negative for congestion and sinus pressure.   Respiratory: Negative for cough and chest tightness.        Breathing better.    Cardiovascular: Positive for leg swelling. Negative for chest pain and palpitations.  Gastrointestinal: Negative for abdominal pain, diarrhea, nausea and vomiting.  Musculoskeletal: Negative for joint swelling.       Persistent right shoulder pain.  S/p fracture.  In sling.    Skin: Negative for color change and rash.  Neurological: Negative for dizziness and headaches.  Psychiatric/Behavioral: Negative for agitation and dysphoric mood.       Objective:    Physical Exam  Constitutional: He appears well-developed and well-nourished. No distress.  HENT:  Mouth/Throat: Oropharynx is clear and moist.  Neck: Neck supple.  Cardiovascular: Normal rate and regular rhythm.   Pulmonary/Chest: Effort normal and breath sounds normal. No respiratory distress.  No wheezing or increased cough.    Abdominal: Soft. Bowel sounds are normal. There is no tenderness.  Musculoskeletal:  Pedal and lower extremity edema.    Lymphadenopathy:    He has no cervical adenopathy.  Skin: No rash noted.  S/p laceration - right lower extremity.  5 sutures removed.  Pt tolerated well.    Psychiatric: He has a normal mood and affect. His behavior is normal.    BP 130/72 (BP Location: Left Arm, Patient Position: Sitting, Cuff Size: Normal)   Pulse (!) 108   Temp (!) 97.4 F (36.3 C) (Oral)   Ht 5' 10.08" (1.78 m)   Wt 258 lb (117 kg)   SpO2 98%   BMI 36.94 kg/m  Wt Readings from Last 3 Encounters:  08/10/17 258 lb (117 kg)  08/03/17 240 lb (108.9 kg)  07/28/17 235 lb (106.6 kg)     Lab Results  Component Value Date   WBC 4.9 08/10/2017   HGB 9.1 (L) 08/10/2017   HCT 29.8 (L) 08/10/2017   PLT 199.0 08/10/2017   GLUCOSE 218 (H) 08/10/2017   CHOL 197 08/09/2015   TRIG 51.0 08/09/2015   HDL 73.60 08/09/2015   LDLCALC 113 (H)  08/09/2015   ALT 8 (L) 07/28/2017   AST 21 07/28/2017   NA 139 08/10/2017   K 3.7 08/10/2017   CL 100 08/10/2017   CREATININE 0.91 08/10/2017   BUN 14 08/10/2017   CO2 30 08/10/2017   TSH 8.444 (H) 01/12/2017   INR 1.17 01/12/2017   HGBA1C 7.6 05/18/2017   MICROALBUR 1.9 09/22/2016  Dg Shoulder Right  Result Date: 08/03/2017 CLINICAL DATA:  Right shoulder pain following a fall. EXAM: RIGHT SHOULDER - 2+ VIEW COMPARISON:  None. FINDINGS: Right humeral neck fracture with mild medial displacement of the distal fragment. No significant angulation. IMPRESSION: Mildly displaced right humeral neck fracture. Electronically Signed   By: Claudie Revering M.D.   On: 08/03/2017 20:30   Dg Chest Port 1 View  Result Date: 08/03/2017 CLINICAL DATA:  Preop evaluation for a right humeral neck fracture. Fell. EXAM: PORTABLE CHEST 1 VIEW COMPARISON:  07/28/2017. FINDINGS: The cardiac silhouette remains borderline enlarged. The pulmonary vasculature remains prominent with decreased prominence of the interstitial markings. Small bilateral pleural effusions. Small amount of linear density in both lower lung zones. No rib fracture or pneumothorax seen. IMPRESSION: 1. Improved interstitial pulmonary edema with increased pulmonary vascular congestion. 2. Small bilateral pleural effusions. 3. Mild bibasilar linear atelectasis. Electronically Signed   By: Claudie Revering M.D.   On: 08/03/2017 20:32       Assessment & Plan:   Problem List Items Addressed This Visit    Anemia    Continue regular B12 injections.  He has missed.  B12 given today.  Low hgb in ER.  Recheck cbc and iron studies.  Has been seeing oncology.        Relevant Medications   cyanocobalamin ((VITAMIN B-12)) injection 1,000 mcg (Completed)   Other Relevant Orders   CBC with Differential/Platelet (Completed)   CAD in native artery    Followed by Dr Nehemiah Massed.  Recently evaluation in ER as outlined.  See above.  Refer back to cardiology.         Relevant Medications   furosemide (LASIX) 20 MG tablet   Other Relevant Orders   Ambulatory referral to Cardiology   Diabetes [facility]) - Primary    Followed by Dr Eddie Dibbles.        Relevant Orders   Basic metabolic panel (Completed)   Essential hypertension, benign    Blood pressure controlled.  Follow pressures.  Follow metabolic panel.        Relevant Medications   furosemide (LASIX) 20 MG tablet   Fall    Recent fall.  Laceration to lower right lower leg.  5 sutures removed today.  Cleaned and dressed.  Discussed the need to follow closely.  bactroban ointment topically.  Swelling improving.  Continue lasix.  Will have vascular surgery evaluate as outlined.  States tripped coming up stairs.  Discussed safety.        Hypercholesterolemia    Supposed to be on pravastatin.  Needs to restart.  Low cholesterol diet and exercise.  Follow metabolic panel.        Relevant Medications   furosemide (LASIX) 20 MG tablet   Mantle cell lymphoma ([place])    Followed by oncology.  Make sure has f/u scheduled.        SOB (shortness of breath)    Breathing has improved.  On daily lasix now.  Will check metabolic panel today.  Previous ECHO with EF 40%.  Has gained some weight.  Unsure of amount.  States he guessed his weight in ER.  No chest pain.  Will have cardiology evaluate with question of need for further w/up, I.e., repeat ECHO, etc.  Pt in agreement.        Relevant Orders   Ambulatory referral to Cardiology   Stress    Improved now that son is in jail.  Follow.  Swelling of left lower extremity    Increased swelling.  States has improved since ER visit.  On lasix.  Check metabolic panel.  Persistent issues with lower extremity swelling.  Unable to appreciate a good pulse - may be related to the swelling.  Will have [facility] evaluate.        Relevant Orders   Ambulatory referral to Vascular Surgery   Thyroid disease    Followed by Dr Eddie Dibbles.         Other Visit Diagnoses    B12  deficiency       Relevant Medications   cyanocobalamin ((VITAMIN B-12)) injection 1,000 mcg (Completed)       [name], MD

## 2017-08-10 NOTE — Telephone Encounter (Signed)
Pt states a referral was supposed to be put in for vascular and cardio. Thank you!

## 2017-08-10 NOTE — Progress Notes (Signed)
Patient received B12 injection in Left deltoid.  Patient tolerated well.

## 2017-08-10 NOTE — Telephone Encounter (Signed)
Called patient left message to let him know that it would be put in as soon as note was completed by you from visit today and should receive call next week. If not received a call by end of next week to let us know.

## 2017-08-11 ENCOUNTER — Encounter: Payer: Self-pay | Admitting: Internal Medicine

## 2017-08-11 ENCOUNTER — Other Ambulatory Visit (INDEPENDENT_AMBULATORY_CARE_PROVIDER_SITE_OTHER): Payer: Medicare Other

## 2017-08-11 ENCOUNTER — Other Ambulatory Visit: Payer: Self-pay | Admitting: Internal Medicine

## 2017-08-11 DIAGNOSIS — E538 Deficiency of other specified B group vitamins: Secondary | ICD-10-CM

## 2017-08-11 DIAGNOSIS — D649 Anemia, unspecified: Secondary | ICD-10-CM | POA: Diagnosis not present

## 2017-08-11 LAB — FERRITIN: FERRITIN: 42 ng/mL (ref 22.0–322.0)

## 2017-08-11 LAB — IBC PANEL
Iron: 13 ug/dL — ABNORMAL LOW (ref 42–165)
SATURATION RATIOS: 3.8 % — AB (ref 20.0–50.0)
Transferrin: 243 mg/dL (ref 212.0–360.0)

## 2017-08-11 LAB — VITAMIN B12: Vitamin B-12: 253 pg/mL (ref 211–911)

## 2017-08-11 NOTE — Telephone Encounter (Signed)
Order has been placed for cardiology referral.  Someone should be calling him with appt.

## 2017-08-11 NOTE — Assessment & Plan Note (Signed)
Followed by Dr Nehemiah Massed.  Recently evaluation in ER as outlined.  See above.  Refer back to cardiology.

## 2017-08-11 NOTE — Progress Notes (Signed)
Order placed for add on labs.   °

## 2017-08-11 NOTE — Assessment & Plan Note (Signed)
Improved now that son is in jail.  Follow.

## 2017-08-11 NOTE — Assessment & Plan Note (Signed)
Followed by oncology.  Make sure has f/u scheduled.

## 2017-08-11 NOTE — Assessment & Plan Note (Signed)
Breathing has improved.  On daily lasix now.  Will check metabolic panel today.  Previous ECHO with EF 40%.  Has gained some weight.  Unsure of amount.  States he guessed his weight in ER.  No chest pain.  Will have cardiology evaluate with question of need for further w/up, I.e., repeat ECHO, etc.  Pt in agreement.

## 2017-08-11 NOTE — Assessment & Plan Note (Signed)
Followed by Dr Eddie Dibbles.

## 2017-08-13 DIAGNOSIS — W19XXXA Unspecified fall, initial encounter: Secondary | ICD-10-CM | POA: Insufficient documentation

## 2017-08-13 DIAGNOSIS — S42309A Unspecified fracture of shaft of humerus, unspecified arm, initial encounter for closed fracture: Secondary | ICD-10-CM | POA: Insufficient documentation

## 2017-08-13 NOTE — Assessment & Plan Note (Signed)
Followed by Dr Eddie Dibbles.

## 2017-08-13 NOTE — Assessment & Plan Note (Signed)
Blood pressure controlled.  Follow pressures.  Follow metabolic panel.

## 2017-08-13 NOTE — Assessment & Plan Note (Signed)
Recent fall.  Laceration to lower right lower leg.  5 sutures removed today.  Cleaned and dressed.  Discussed the need to follow closely.  bactroban ointment topically.  Swelling improving.  Continue lasix.  Will have vascular surgery evaluate as outlined.  States tripped coming up stairs.  Discussed safety.

## 2017-08-13 NOTE — Assessment & Plan Note (Signed)
Supposed to be on pravastatin.  Needs to restart.  Low cholesterol diet and exercise.  Follow metabolic panel.

## 2017-08-13 NOTE — Assessment & Plan Note (Signed)
In a sling.  Seeing ortho.

## 2017-08-13 NOTE — Assessment & Plan Note (Signed)
Increased swelling.  States has improved since ER visit.  On lasix.  Check metabolic panel.  Persistent issues with lower extremity swelling.  Unable to appreciate a good pulse - may be related to the swelling.  Will have [facility] evaluate.

## 2017-08-13 NOTE — Assessment & Plan Note (Signed)
Continue regular B12 injections.  He has missed.  B12 given today.  Low hgb in ER.  Recheck cbc and iron studies.  Has been seeing oncology.

## 2017-08-19 DIAGNOSIS — E0500 Thyrotoxicosis with diffuse goiter without thyrotoxic crisis or storm: Secondary | ICD-10-CM | POA: Diagnosis not present

## 2017-08-19 DIAGNOSIS — E10649 Type 1 diabetes mellitus with hypoglycemia without coma: Secondary | ICD-10-CM | POA: Diagnosis not present

## 2017-08-20 DIAGNOSIS — I5021 Acute systolic (congestive) heart failure: Secondary | ICD-10-CM | POA: Diagnosis not present

## 2017-08-20 DIAGNOSIS — I10 Essential (primary) hypertension: Secondary | ICD-10-CM | POA: Diagnosis not present

## 2017-08-20 DIAGNOSIS — I2510 Atherosclerotic heart disease of native coronary artery without angina pectoris: Secondary | ICD-10-CM | POA: Diagnosis not present

## 2017-08-20 DIAGNOSIS — R0602 Shortness of breath: Secondary | ICD-10-CM | POA: Diagnosis not present

## 2017-08-25 ENCOUNTER — Ambulatory Visit: Payer: Medicare Other

## 2017-08-30 ENCOUNTER — Encounter (INDEPENDENT_AMBULATORY_CARE_PROVIDER_SITE_OTHER): Payer: Self-pay | Admitting: Vascular Surgery

## 2017-08-30 DIAGNOSIS — Z4681 Encounter for fitting and adjustment of insulin pump: Secondary | ICD-10-CM | POA: Diagnosis not present

## 2017-08-30 DIAGNOSIS — E039 Hypothyroidism, unspecified: Secondary | ICD-10-CM | POA: Diagnosis not present

## 2017-08-30 DIAGNOSIS — E785 Hyperlipidemia, unspecified: Secondary | ICD-10-CM | POA: Diagnosis not present

## 2017-08-30 DIAGNOSIS — E0500 Thyrotoxicosis with diffuse goiter without thyrotoxic crisis or storm: Secondary | ICD-10-CM | POA: Diagnosis not present

## 2017-08-30 DIAGNOSIS — E1042 Type 1 diabetes mellitus with diabetic polyneuropathy: Secondary | ICD-10-CM | POA: Diagnosis not present

## 2017-08-30 DIAGNOSIS — E10649 Type 1 diabetes mellitus with hypoglycemia without coma: Secondary | ICD-10-CM | POA: Diagnosis not present

## 2017-09-07 ENCOUNTER — Encounter (INDEPENDENT_AMBULATORY_CARE_PROVIDER_SITE_OTHER): Payer: Medicare Other | Admitting: Vascular Surgery

## 2017-09-07 ENCOUNTER — Telehealth: Payer: Self-pay | Admitting: Internal Medicine

## 2017-09-07 NOTE — Progress Notes (Signed)
[place]  [phone] [phone]  ID: Gary [name] OB: 1945-11-12  MR#: [id number]  YQM#:[id number]  Patient Care Team: [name], MD as PCP - General (Internal Medicine)  CHIEF COMPLAINT: Mantle cell lymphoma.  INTERVAL HISTORY: Patient returns to clinic today at the request of his primary care physician for declining iron stores and hemoglobin. He continues to feel well and is asymptomatic. He denies any fevers, chills, or night sweats. He has a good appetite and denies weight loss. He has no neurologic complaints. He denies any chest pain or shortness of breath. He has no nausea, vomiting, constipation, or diarrhea.  He denies any melena or hematochezia.  He has no urinary complaints. Patient offers no specific complaints today.  REVIEW OF SYSTEMS:   Review of Systems  Constitutional: Negative for diaphoresis, fever, malaise/fatigue and weight loss.  Respiratory: Negative.  Negative for cough and shortness of breath.   Cardiovascular: Negative.  Negative for chest pain and leg swelling.  Gastrointestinal: Negative.  Negative for abdominal pain, blood in stool and melena.  Genitourinary: Negative.  Negative for hematuria.  Musculoskeletal: Negative.   Skin: Negative.  Negative for rash.  Neurological: Negative.  Negative for sensory change and weakness.  Psychiatric/Behavioral: Negative.  The patient is not nervous/anxious.     As per HPI. Otherwise, a complete review of systems is negative.  PAST MEDICAL HISTORY: Past Medical History:  Diagnosis Date  . Depression   . 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 Gastroparesis   . Graves disease   . Hypercholesterolemia   . Hypertension   . Mantle cell lymphoma ([place])   . Peripheral neuropathy     PAST SURGICAL HISTORY: Past Surgical History:  Procedure Laterality Date  . NEPHRECTOMY     right    FAMILY HISTORY Family  History  Problem Relation Age of Onset  . Breast cancer Mother   . Diabetes Father   . Cancer Father        Lymphoma  . Alcohol abuse Maternal Uncle   . Diabetes Sister   . Heart disease Unknown        grandfather       ADVANCED DIRECTIVES:    HEALTH MAINTENANCE: Social History   Tobacco Use  . Smoking status: Former Research scientist (life sciences)  . Smokeless tobacco: Current User    Types: Chew  Substance Use Topics  . Alcohol use: Yes    Alcohol/week: 1.8 oz    Types: 3 Cans of beer per week    Comment: nightly  . Drug use: No     Colonoscopy:  PAP:  Bone density:  Lipid panel:  Allergies  Allergen Reactions  . Rofecoxib Nausea Only    Current Outpatient Medications  Medication Sig Dispense Refill  . ACCU-CHEK AVIVA PLUS test strip USE AS INSTRUCTED. USE 1 STRIP EVERY 3 HOURS . ACCU-CHECK AVIVA PLUS TEST STRIP. DX E10.649  12  . aspirin EC 325 MG tablet Take 325 mg by mouth every 6 (six) hours as needed for mild pain.     . furosemide (LASIX) 20 MG tablet Take 1 tablet (20 mg total) by mouth daily. 30 tablet 1  . insulin aspart (NOVOLOG) 100 UNIT/ML injection Use as directed (Patient taking differently: Inject 0.5-1 Units into the skin every hour. Use as directed) 1 vial 12  . Multiple Vitamin (MULTIVITAMIN) tablet Take 1 tablet by mouth daily.    . mupirocin ointment (BACTROBAN) 2 % Apply  to affected area bid 22 g 0  . pantoprazole (PROTONIX) 40 MG tablet TAKE 1 TABLET (40 MG TOTAL) BY MOUTH DAILY. 30 tablet 0  . pravastatin (PRAVACHOL) 40 MG tablet Take 1 tablet (40 mg total) by mouth at bedtime. Will need office visit for any more refills 30 tablet 0  . thiamine (VITAMIN B-1) 100 MG tablet Take 1 tablet (100 mg total) by mouth daily. 90 tablet 0   No current facility-administered medications for this visit.     OBJECTIVE: Vitals:   09/09/17 1413  BP: (!) 146/73  Pulse: (!) 117  Resp: 20  Temp: (!) 97.3 F (36.3 C)     Body mass index is 37.61 kg/m.    ECOG FS:0 -  Asymptomatic  General: Disheveled appearing, no acute distress. Eyes: Pink conjunctiva, anicteric sclera. Lungs: Clear to auscultation bilaterally. Heart: Regular rate and rhythm. No rubs, murmurs, or gallops. Abdomen: Soft, nontender, nondistended. No organomegaly noted, normoactive bowel sounds. Musculoskeletal: No edema, cyanosis, or clubbing. Neuro: Alert, answering all questions appropriately. Cranial nerves grossly intact. Skin: No rashes or petechiae noted. Psych: Normal affect. Lymphatics: No cervical, calvicular, axillary or inguinal LAD.   LAB RESULTS:  Lab Results  Component Value Date   NA 139 08/10/2017   K 3.7 08/10/2017   CL 100 08/10/2017   CO2 30 08/10/2017   GLUCOSE 218 (H) 08/10/2017   BUN 14 08/10/2017   CREATININE 0.91 08/10/2017   CALCIUM 8.4 08/10/2017   PROT 6.7 07/28/2017   ALBUMIN 3.2 (L) 07/28/2017   AST 21 07/28/2017   ALT 8 (L) 07/28/2017   ALKPHOS 84 07/28/2017   BILITOT 0.7 07/28/2017   GFRNONAA >60 07/28/2017   GFRAA >60 07/28/2017    Lab Results  Component Value Date   WBC 4.9 08/10/2017   NEUTROABS 3.5 08/10/2017   HGB 9.1 (L) 08/10/2017   HCT 29.8 (L) 08/10/2017   MCV 73.4 (L) 08/10/2017   PLT 199.0 08/10/2017   Lab Results  Component Value Date   IRON 13 (L) 08/11/2017   TIBC 223 (L) 01/12/2017   IRONPCTSAT 3.8 (L) 08/11/2017   Lab Results  Component Value Date   FERRITIN 42.0 08/11/2017     STUDIES: No results found.  ASSESSMENT: Low-grade mantle cell lymphoma diagnosed in September 2015, iron deficiency anemia.  PLAN:    1. Mantle cell lymphoma: Previously, CT scans reviewed independently with persistent and slight interval progression of known adenopathy consistent with mantle cell lymphoma. No intervention is needed at this time. Patient has not required treatment at this point, but if he had progressive disease would consider Rituxan plus Treanda. No bone marrow biopsy has been performed. This was previously  discussed and determined it would not change overall treatment or prognosis. Return to clinic as previously scheduled in January 2019 with repeat imaging and further evaluation. 2. Decreased ejection fraction: Previously patient had an ejection fraction of 40% with no clinical CHF. Monitor. 3. Diabetes: Continue insulin pump as directed. 4. Thrombocytopenia: Resolved. 5. Hypertension: Continue treatment and evaluation per PCP. 6.  Iron deficiency anemia: Patient will return to clinic in 1 and 2 weeks to receive 510 mg IV iron.  He will then return to clinic as previously scheduled for repeat laboratory work and further evaluation.  Patient expressed understanding and was in agreement with this plan. He also understands that He can call clinic at any time with any questions, concerns, or complaints.    Lloyd Huger, MD   09/11/2017 9:38 AM

## 2017-09-07 NOTE — Telephone Encounter (Signed)
Copied from [place] [id number]. Topic: Quick Communication - See Telephone Encounter >> Sep 07, 2017  3:49 PM Synthia Innocent wrote: CRM for notification. See Telephone encounter for:  Patient states he has a rash in his groin. Can something be called in or does he need to be seen. [facility] 09/07/17.

## 2017-09-08 ENCOUNTER — Telehealth: Payer: Self-pay | Admitting: Internal Medicine

## 2017-09-08 ENCOUNTER — Ambulatory Visit: Payer: Self-pay | Admitting: *Deleted

## 2017-09-08 DIAGNOSIS — S42201A Unspecified fracture of upper end of right humerus, initial encounter for closed fracture: Secondary | ICD-10-CM | POA: Diagnosis not present

## 2017-09-08 NOTE — Telephone Encounter (Signed)
Triage  Phone  Encounter  created

## 2017-09-08 NOTE — Telephone Encounter (Signed)
Pt has  An  appt on Dec 4 with  His  Pcp. He  Was   Advised  To   Make  Sure  He  Keeps  The  Appointment -.Pt  Was  Advised  To  Seek  Attention  If  Any  Swelling  Or  Symptoms  Worse. He  Was  Advised  To try  Some  otc cream  From  Pharmacy to  Apply  To affected  Area . He  Was  Advised   To   Go to er  If  Any  Problems  Urinating .  Reason for Disposition . Red, moist, irritated area between skin folds (or under larger breasts)  Answer Assessment - Initial Assessment Questions 1. APPEARANCE of RASH: "Describe the rash."      Red     No   Swelling    2. LOCATION: "Where is the rash located?"       Groin 3. NUMBER: "How many spots are there?"       Two  Spots  4. SIZE: "How big are the spots?" (Inches, centimeters or compare to size of a coin)       4  Fingers  Side by  Side     5. ONSET: "When did the rash start?"        10  Days    6. ITCHING: "Does the rash itch?" If so, ask: "How bad is the itch?"  (Scale 1-10; or mild, moderate, severe)      No  Itching  7. PAIN: "Does the rash hurt?" If so, ask: "How bad is the pain?"  (Scale 1-10; or mild, moderate, severe)     Mild  Pain  In   scotrum   8. OTHER SYMPTOMS: "Do you have any other symptoms?" (e.g., fever)      Unknown if  Scrotum   Is   Swelling   No  Burning    Making  Urine  adequetly    9. PREGNANCY: "Is there any chance you are pregnant?" "When was your last menstrual period?"     no  Protocols used: RASH OR REDNESS - LOCALIZED-A-AH

## 2017-09-08 NOTE — Telephone Encounter (Signed)
Triage  Note  Telephone  Encounter created

## 2017-09-08 NOTE — Telephone Encounter (Signed)
Pt  Has  A  Rash   On  Groin     Area     Slightly  Red     X  1   Week      No  Tender    On  Contact    Pt  Is  A  Diabetic     Pt  Also  Reports   And   Swelling  Both  Legs    Pt  Reports    Taking  Fluid  Pills   Which  Is   Helping  Some

## 2017-09-09 ENCOUNTER — Encounter: Payer: Self-pay | Admitting: Oncology

## 2017-09-09 ENCOUNTER — Other Ambulatory Visit: Payer: Self-pay

## 2017-09-09 ENCOUNTER — Inpatient Hospital Stay: Payer: Medicare Other | Attending: Oncology | Admitting: Oncology

## 2017-09-09 VITALS — BP 146/73 | HR 117 | Temp 97.3°F | Resp 20 | Wt 262.7 lb

## 2017-09-09 DIAGNOSIS — Z794 Long term (current) use of insulin: Secondary | ICD-10-CM

## 2017-09-09 DIAGNOSIS — E1143 Type 2 diabetes mellitus with diabetic autonomic (poly)neuropathy: Secondary | ICD-10-CM | POA: Diagnosis not present

## 2017-09-09 DIAGNOSIS — I10 Essential (primary) hypertension: Secondary | ICD-10-CM | POA: Insufficient documentation

## 2017-09-09 DIAGNOSIS — G629 Polyneuropathy, unspecified: Secondary | ICD-10-CM | POA: Insufficient documentation

## 2017-09-09 DIAGNOSIS — E7800 Pure hypercholesterolemia, unspecified: Secondary | ICD-10-CM | POA: Insufficient documentation

## 2017-09-09 DIAGNOSIS — Z87891 Personal history of nicotine dependence: Secondary | ICD-10-CM | POA: Insufficient documentation

## 2017-09-09 DIAGNOSIS — D509 Iron deficiency anemia, unspecified: Secondary | ICD-10-CM | POA: Diagnosis not present

## 2017-09-09 DIAGNOSIS — I2510 Atherosclerotic heart disease of native coronary artery without angina pectoris: Secondary | ICD-10-CM | POA: Diagnosis not present

## 2017-09-09 DIAGNOSIS — Z79899 Other long term (current) drug therapy: Secondary | ICD-10-CM | POA: Diagnosis not present

## 2017-09-09 DIAGNOSIS — Z803 Family history of malignant neoplasm of breast: Secondary | ICD-10-CM | POA: Diagnosis not present

## 2017-09-09 DIAGNOSIS — Z7982 Long term (current) use of aspirin: Secondary | ICD-10-CM | POA: Diagnosis not present

## 2017-09-09 DIAGNOSIS — E11311 Type 2 diabetes mellitus with unspecified diabetic retinopathy with macular edema: Secondary | ICD-10-CM | POA: Insufficient documentation

## 2017-09-09 DIAGNOSIS — C8310 Mantle cell lymphoma, unspecified site: Secondary | ICD-10-CM | POA: Diagnosis not present

## 2017-09-09 DIAGNOSIS — Z905 Acquired absence of kidney: Secondary | ICD-10-CM

## 2017-09-09 DIAGNOSIS — Z807 Family history of other malignant neoplasms of lymphoid, hematopoietic and related tissues: Secondary | ICD-10-CM | POA: Insufficient documentation

## 2017-09-09 DIAGNOSIS — E0500 Thyrotoxicosis with diffuse goiter without thyrotoxic crisis or storm: Secondary | ICD-10-CM | POA: Diagnosis not present

## 2017-09-09 NOTE — Progress Notes (Signed)
Patient here today for follow up regarding iron levels checked at his PCP, denies concerns.

## 2017-09-11 DIAGNOSIS — D509 Iron deficiency anemia, unspecified: Secondary | ICD-10-CM | POA: Insufficient documentation

## 2017-09-14 ENCOUNTER — Ambulatory Visit (INDEPENDENT_AMBULATORY_CARE_PROVIDER_SITE_OTHER): Payer: Medicare Other | Admitting: *Deleted

## 2017-09-14 DIAGNOSIS — E538 Deficiency of other specified B group vitamins: Secondary | ICD-10-CM | POA: Diagnosis not present

## 2017-09-14 MED ORDER — CYANOCOBALAMIN 1000 MCG/ML IJ SOLN
1000.0000 ug | Freq: Once | INTRAMUSCULAR | Status: AC
  Administered 2017-09-14: 1000 ug via INTRAMUSCULAR

## 2017-09-14 NOTE — Progress Notes (Addendum)
Patient presented for B 12 injection to left deltoid, patient voiced no concerns nor showed any signs of distress during injection.  Reviewed.  Dr Scott 

## 2017-09-16 ENCOUNTER — Ambulatory Visit (INDEPENDENT_AMBULATORY_CARE_PROVIDER_SITE_OTHER): Payer: Medicare Other | Admitting: Vascular Surgery

## 2017-09-16 ENCOUNTER — Inpatient Hospital Stay: Payer: Medicare Other | Attending: Oncology

## 2017-09-16 ENCOUNTER — Encounter (INDEPENDENT_AMBULATORY_CARE_PROVIDER_SITE_OTHER): Payer: Self-pay | Admitting: Vascular Surgery

## 2017-09-16 VITALS — BP 160/60 | HR 100 | Temp 97.6°F | Resp 18

## 2017-09-16 VITALS — BP 123/62 | HR 102 | Resp 17 | Ht 70.0 in | Wt 251.0 lb

## 2017-09-16 DIAGNOSIS — I2510 Atherosclerotic heart disease of native coronary artery without angina pectoris: Secondary | ICD-10-CM | POA: Diagnosis not present

## 2017-09-16 DIAGNOSIS — E7800 Pure hypercholesterolemia, unspecified: Secondary | ICD-10-CM

## 2017-09-16 DIAGNOSIS — L03115 Cellulitis of right lower limb: Secondary | ICD-10-CM

## 2017-09-16 DIAGNOSIS — C8310 Mantle cell lymphoma, unspecified site: Secondary | ICD-10-CM | POA: Insufficient documentation

## 2017-09-16 DIAGNOSIS — D509 Iron deficiency anemia, unspecified: Secondary | ICD-10-CM | POA: Diagnosis not present

## 2017-09-16 DIAGNOSIS — R600 Localized edema: Secondary | ICD-10-CM

## 2017-09-16 DIAGNOSIS — E1151 Type 2 diabetes mellitus with diabetic peripheral angiopathy without gangrene: Secondary | ICD-10-CM | POA: Diagnosis not present

## 2017-09-16 DIAGNOSIS — L0390 Cellulitis, unspecified: Secondary | ICD-10-CM | POA: Insufficient documentation

## 2017-09-16 MED ORDER — SODIUM CHLORIDE 0.9 % IV SOLN
510.0000 mg | Freq: Once | INTRAVENOUS | Status: AC
  Administered 2017-09-16: 510 mg via INTRAVENOUS
  Filled 2017-09-16: qty 17

## 2017-09-16 MED ORDER — SODIUM CHLORIDE 0.9 % IV SOLN
Freq: Once | INTRAVENOUS | Status: AC
  Administered 2017-09-16: 13:00:00 via INTRAVENOUS
  Filled 2017-09-16: qty 1000

## 2017-09-16 MED ORDER — CEPHALEXIN 500 MG PO CAPS: 500.0000 mg | ORAL_CAPSULE | Freq: Four times a day (QID) | ORAL | 0 refills | Status: DC

## 2017-09-16 NOTE — Progress Notes (Signed)
Subjective:    [name], male    DOB: 1946-05-26, 71 y.o.   MRN: [id number] Chief Complaint  Patient presents with  . New Patient (Initial Visit)    ref Nicki Reaper rle ulcer and edema   Presents as a new patient referred by Dr. Nicki Reaper for evaluation of bilateral lower extremity edema and weeping.  The patient endorses a month long history of progressively worsening bilateral lower extremity edema.  He notes clear fluid "leaking" from his right lower extremity.  The patient does not engage in any conservative therapy including wearing medical grade one compression stockings or elevation of his lower extremity.  Patient does endorse a long-standing history of bilateral lower extremity however the weeping is a new symptom.  The patient denies any claudication-like symptoms or rest pain.  The patient notes his lower extremity edema is worse towards the evening or after standing or sitting for long periods of time.  The patient denies any surgery or trauma to the lower extremity.  The patient denies any DVT history to the lower extremity.  The patient denies any shortness of breath or chest pain.  The patient denies any fever, nausea vomiting.   Review of Systems  Constitutional: Negative.   HENT: Negative.   Eyes: Negative.   Respiratory: Negative.   Cardiovascular: Positive for leg swelling.       Weeping right lower extremity  Gastrointestinal: Negative.   Endocrine: Negative.   Genitourinary: Negative.   Musculoskeletal: Negative.   Skin: Negative.   Allergic/Immunologic: Negative.   Neurological: Negative.   Hematological: Negative.   Psychiatric/Behavioral: Negative.       Objective:   Physical Exam  Constitutional: He is oriented to person, place, and time. He appears well-developed and well-nourished. No distress.  Poor personal hygiene  HENT:  Head: Normocephalic and atraumatic.  Eyes: Conjunctivae are normal. Pupils are equal, round, and reactive to light.  Neck:  Normal range of motion.  Cardiovascular: Normal rate, regular rhythm, normal heart sounds and intact distal pulses.  Pulses:      Radial pulses are 2+ on the right side, and 2+ on the left side.  Unable to palpate pedal pulses due to bilateral lower extremity edema and body habitus  Pulmonary/Chest: Effort normal and breath sounds normal.  Musculoskeletal: Normal range of motion. He exhibits edema (Moderate 2+ pitting edema noted bilaterally right lower extremity slightly larger when compared to left.).  Neurological: He is alert and oriented to person, place, and time.  Skin: He is not diaphoretic.  Right lower extremity: Cellulitis noted.  Multiple weeping ulcerations with a clear fluid.  Skin thickening noted.  Mild stasis dermatitis noted. Left lower extremity: No cellulitis noted.  Skin is intact.  Skin thickening is noted.  Mild stasis dermatitis is noted.  Psychiatric: He has a normal mood and affect. His behavior is normal. Judgment and thought content normal.  Vitals reviewed.  BP 123/62 (BP Location: Left Arm)   Pulse (!) 102   Resp 17   Ht 5\' 10"  (1.778 m)   Wt 251 lb (113.9 kg)   BMI 36.01 kg/m   Past Medical History:  Diagnosis Date  . Depression   . 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 Gastroparesis   . Graves disease   . Hypercholesterolemia   . Hypertension   . Mantle cell lymphoma ([place])   . Peripheral neuropathy    Social History   Socioeconomic History  .  Marital status: Married    Spouse name: Not on file  . Number of children: 1  . Years of education: Not on file  . Highest education level: Not on file  Social Needs  . Financial resource strain: Not on file  . Food insecurity - worry: Not on file  . Food insecurity - inability: Not on file  . Transportation needs - medical: Not on file  . Transportation needs - non-medical: Not on file  Occupational History  . Not on file  Tobacco Use  .  Smoking status: Former Research scientist (life sciences)  . Smokeless tobacco: Current User    Types: Chew  Substance and Sexual Activity  . Alcohol use: Yes    Alcohol/week: 1.8 oz    Types: 3 Cans of beer per week    Comment: nightly  . Drug use: No  . Sexual activity: Not Currently  Other Topics Concern  . Not on file  Social History Narrative  . Not on file   Past Surgical History:  Procedure Laterality Date  . NEPHRECTOMY     right   Family History  Problem Relation Age of Onset  . Breast cancer Mother   . Diabetes Father   . Cancer Father        Lymphoma  . Alcohol abuse Maternal Uncle   . Diabetes Sister   . Heart disease Unknown        grandfather   Allergies  Allergen Reactions  . Rofecoxib Nausea Only      Assessment & Plan:  Presents as a new patient referred by Dr. Nicki Reaper for evaluation of bilateral lower extremity edema and weeping.  The patient endorses a month long history of progressively worsening bilateral lower extremity edema.  He notes clear fluid "leaking" from his right lower extremity.  The patient does not engage in any conservative therapy including wearing medical grade one compression stockings or elevation of his lower extremity.  Patient does endorse a long-standing history of bilateral lower extremity however the weeping is a new symptom.  The patient denies any claudication-like symptoms or rest pain.  The patient notes his lower extremity edema is worse towards the evening or after standing or sitting for long periods of time.  The patient denies any surgery or trauma to the lower extremity.  The patient denies any DVT history to the lower extremity.  The patient denies any shortness of breath or chest pain.  The patient denies any fever, nausea vomiting.  1. Cellulitis of right lower extremity - New Mild cellulitis to the right lower extremity Multiple ulcerations weeping a clear fluid With Keflex 500 mg 1 tab every 6 hours for 10 days To follow-up in 1 week to assess  cellulitis If the patient is to experience any worsening erythema to the leg, fever, nausea vomiting he must seek medical attention immediately Patient and his wife expressed their understanding  - VAS [place] ABI WITH/WO TBI; Future  2. Bilateral lower extremity edema - New The patient does not engage in any type of conservative therapy The patient presents today with moderately edematous bilateral lower extremity with a right weeping lower extremity. Recommend bilateral 3 layer zinc oxide Unna wraps with for approximately 1 month with weekly changes I discussed and reviewed appropriate elevation with the patient is heart level or higher as much as possible I will bring the patient back in 1 month to assess his progress with Unna boot therapy and elevation At that time the patient will undergo an ABI  to assess for any contributing peripheral artery disease The patient will also undergo a bilateral venous duplex to assess for any contributing venous disease Most likely benefit from a lymphedema pump Edema is controlled I will transition him into medical grade 1 compression stockings  - VAS [place] LOWER EXTREMITY VENOUS REFLUX; Future  3. Hypercholesterolemia - Stable Encouraged good control as its slows the progression of atherosclerotic disease  4. Type 2 diabetes mellitus with diabetic peripheral angiopathy without gangrene, without long-term current use of insulin (HCC) - Stable Encouraged good control as its slows the progression of atherosclerotic disease  Current Outpatient Medications on File Prior to Visit  Medication Sig Dispense Refill  . aspirin EC 325 MG tablet Take 325 mg by mouth every 6 (six) hours as needed for mild pain.     . ferrous sulfate 325 (65 FE) MG tablet Take 325 mg by mouth daily.    . furosemide (LASIX) 20 MG tablet Take 1 tablet (20 mg total) by mouth daily. 30 tablet 1  . insulin aspart (NOVOLOG) 100 UNIT/ML injection Use as directed 1 vial 12  . levothyroxine  (SYNTHROID, LEVOTHROID) 25 MCG tablet Take 25 mcg by mouth daily.    Marland Kitchen lisinopril (PRINIVIL,ZESTRIL) 20 MG tablet Take 20 mg by mouth daily.    . Multiple Vitamin (MULTIVITAMIN) tablet Take 1 tablet by mouth daily.    . mupirocin ointment (BACTROBAN) 2 % Apply to affected area bid 22 g 0  . naproxen (NAPROSYN) 250 MG tablet Take by mouth as needed.    . pravastatin (PRAVACHOL) 40 MG tablet Take 1 tablet (40 mg total) by mouth at bedtime. Will need office visit for any more refills 30 tablet 0  . ACCU-CHEK AVIVA PLUS test strip USE AS INSTRUCTED. USE 1 STRIP EVERY 3 HOURS . ACCU-CHECK AVIVA PLUS TEST STRIP. DX E10.649  12  . pantoprazole (PROTONIX) 40 MG tablet TAKE 1 TABLET (40 MG TOTAL) BY MOUTH DAILY. (Patient not taking: Reported on 09/16/2017) 30 tablet 0  . thiamine (VITAMIN B-1) 100 MG tablet Take 1 tablet (100 mg total) by mouth daily. (Patient not taking: Reported on 09/16/2017) 90 tablet 0   No current facility-administered medications on file prior to visit.    There are no Patient Instructions on file for this visit. No Follow-up on file.  Parish Dubose A Reilynn Lauro, PA-C

## 2017-09-21 ENCOUNTER — Other Ambulatory Visit: Payer: Self-pay | Admitting: Internal Medicine

## 2017-09-22 ENCOUNTER — Ambulatory Visit (INDEPENDENT_AMBULATORY_CARE_PROVIDER_SITE_OTHER): Payer: Medicare Other | Admitting: Vascular Surgery

## 2017-09-22 ENCOUNTER — Encounter (INDEPENDENT_AMBULATORY_CARE_PROVIDER_SITE_OTHER): Payer: Self-pay | Admitting: Vascular Surgery

## 2017-09-22 VITALS — Resp 19 | Ht 65.0 in | Wt 248.0 lb

## 2017-09-22 DIAGNOSIS — M7989 Other specified soft tissue disorders: Secondary | ICD-10-CM

## 2017-09-22 NOTE — Progress Notes (Signed)
History of Present Illness  There is no documented history at this time  Assessments & Plan   There are no diagnoses linked to this encounter.    Additional instructions  Subjective:  Patient presents with venous ulcer of the Bilateral lower extremity.    Procedure:  3 layer unna wrap was placed Bilateral lower extremity.   Plan:   Follow up in one week.  

## 2017-09-23 ENCOUNTER — Inpatient Hospital Stay: Payer: Medicare Other

## 2017-09-28 ENCOUNTER — Inpatient Hospital Stay: Payer: Medicare Other

## 2017-09-28 VITALS — BP 113/62 | HR 92 | Temp 97.2°F | Resp 20

## 2017-09-28 DIAGNOSIS — D509 Iron deficiency anemia, unspecified: Secondary | ICD-10-CM | POA: Diagnosis not present

## 2017-09-28 DIAGNOSIS — C8310 Mantle cell lymphoma, unspecified site: Secondary | ICD-10-CM | POA: Diagnosis not present

## 2017-09-28 MED ORDER — SODIUM CHLORIDE 0.9 % IV SOLN
Freq: Once | INTRAVENOUS | Status: AC
  Administered 2017-09-28: 14:00:00 via INTRAVENOUS
  Filled 2017-09-28: qty 1000

## 2017-09-28 MED ORDER — SODIUM CHLORIDE 0.9 % IV SOLN
510.0000 mg | Freq: Once | INTRAVENOUS | Status: AC
  Administered 2017-09-28: 510 mg via INTRAVENOUS
  Filled 2017-09-28: qty 17

## 2017-09-29 ENCOUNTER — Encounter (INDEPENDENT_AMBULATORY_CARE_PROVIDER_SITE_OTHER): Payer: Self-pay | Admitting: Vascular Surgery

## 2017-09-29 ENCOUNTER — Ambulatory Visit (INDEPENDENT_AMBULATORY_CARE_PROVIDER_SITE_OTHER): Payer: Medicare Other | Admitting: Vascular Surgery

## 2017-09-29 DIAGNOSIS — R600 Localized edema: Secondary | ICD-10-CM

## 2017-09-29 NOTE — Progress Notes (Signed)
History of Present Illness  There is no documented history at this time  Assessments & Plan   There are no diagnoses linked to this encounter.    Additional instructions  Subjective:  Patient presents with venous ulcer of the Bilateral lower extremity.    Procedure:  3 layer unna wrap was placed Bilateral lower extremity.   Plan:   Follow up in one week.  

## 2017-10-06 ENCOUNTER — Encounter (INDEPENDENT_AMBULATORY_CARE_PROVIDER_SITE_OTHER): Payer: Self-pay

## 2017-10-06 ENCOUNTER — Ambulatory Visit (INDEPENDENT_AMBULATORY_CARE_PROVIDER_SITE_OTHER): Payer: Medicare Other | Admitting: Vascular Surgery

## 2017-10-06 VITALS — BP 124/69 | HR 63 | Resp 17 | Wt 255.0 lb

## 2017-10-06 DIAGNOSIS — S42201A Unspecified fracture of upper end of right humerus, initial encounter for closed fracture: Secondary | ICD-10-CM | POA: Diagnosis not present

## 2017-10-06 DIAGNOSIS — R600 Localized edema: Secondary | ICD-10-CM

## 2017-10-06 NOTE — Progress Notes (Signed)
History of Present Illness  There is no documented history at this time  Assessments & Plan   There are no diagnoses linked to this encounter.    Additional instructions  Subjective:  Patient presents with venous ulcer of the Bilateral lower extremity.    Procedure:  3 layer unna wrap was placed Bilateral lower extremity.   Plan:   Follow up in one week.  

## 2017-10-07 DIAGNOSIS — I5022 Chronic systolic (congestive) heart failure: Secondary | ICD-10-CM | POA: Diagnosis not present

## 2017-10-07 DIAGNOSIS — R0602 Shortness of breath: Secondary | ICD-10-CM | POA: Diagnosis not present

## 2017-10-07 DIAGNOSIS — I10 Essential (primary) hypertension: Secondary | ICD-10-CM | POA: Diagnosis not present

## 2017-10-07 DIAGNOSIS — I2510 Atherosclerotic heart disease of native coronary artery without angina pectoris: Secondary | ICD-10-CM | POA: Diagnosis not present

## 2017-10-07 DIAGNOSIS — I6523 Occlusion and stenosis of bilateral carotid arteries: Secondary | ICD-10-CM | POA: Diagnosis not present

## 2017-10-11 DIAGNOSIS — R112 Nausea with vomiting, unspecified: Secondary | ICD-10-CM | POA: Diagnosis not present

## 2017-10-11 DIAGNOSIS — C20 Malignant neoplasm of rectum: Secondary | ICD-10-CM | POA: Diagnosis not present

## 2017-10-11 DIAGNOSIS — K51411 Inflammatory polyps of colon with rectal bleeding: Secondary | ICD-10-CM | POA: Diagnosis not present

## 2017-10-11 DIAGNOSIS — Z85038 Personal history of other malignant neoplasm of large intestine: Secondary | ICD-10-CM | POA: Diagnosis not present

## 2017-10-11 DIAGNOSIS — M545 Low back pain: Secondary | ICD-10-CM | POA: Diagnosis not present

## 2017-10-11 DIAGNOSIS — R5383 Other fatigue: Secondary | ICD-10-CM | POA: Diagnosis not present

## 2017-10-11 DIAGNOSIS — Z800 Family history of malignant neoplasm of digestive organs: Secondary | ICD-10-CM | POA: Diagnosis not present

## 2017-10-11 DIAGNOSIS — R070 Pain in throat: Secondary | ICD-10-CM | POA: Diagnosis not present

## 2017-10-11 DIAGNOSIS — Z803 Family history of malignant neoplasm of breast: Secondary | ICD-10-CM | POA: Diagnosis not present

## 2017-10-11 DIAGNOSIS — R1030 Lower abdominal pain, unspecified: Secondary | ICD-10-CM | POA: Diagnosis not present

## 2017-10-13 ENCOUNTER — Encounter (INDEPENDENT_AMBULATORY_CARE_PROVIDER_SITE_OTHER): Payer: Self-pay

## 2017-10-13 ENCOUNTER — Ambulatory Visit (INDEPENDENT_AMBULATORY_CARE_PROVIDER_SITE_OTHER): Payer: Medicare Other | Admitting: Vascular Surgery

## 2017-10-13 VITALS — HR 68 | Resp 17 | Ht 71.0 in | Wt 258.0 lb

## 2017-10-13 DIAGNOSIS — R600 Localized edema: Secondary | ICD-10-CM | POA: Diagnosis not present

## 2017-10-13 DIAGNOSIS — M7989 Other specified soft tissue disorders: Secondary | ICD-10-CM

## 2017-10-13 NOTE — Progress Notes (Signed)
History of Present Illness  There is no documented history at this time  Assessments & Plan   There are no diagnoses linked to this encounter.    Additional instructions  Subjective:  Patient presents with venous ulcer of the Bilateral lower extremity.    Procedure:  3 layer unna wrap was placed Bilateral lower extremity.   Plan:   Follow up in one week.  

## 2017-10-19 ENCOUNTER — Ambulatory Visit
Admission: RE | Admit: 2017-10-19 | Discharge: 2017-10-19 | Disposition: A | Discharge status: Home / Self Care | Payer: Medicare Other | Source: Ambulatory Visit | Attending: Oncology | Admitting: Oncology

## 2017-10-19 ENCOUNTER — Ambulatory Visit: Admission: RE | Admit: 2017-10-19 | Payer: Medicare Other | Source: Ambulatory Visit

## 2017-10-19 ENCOUNTER — Inpatient Hospital Stay: Admission: RE | Admit: 2017-10-19 | Payer: Medicare Other | Source: Ambulatory Visit

## 2017-10-19 DIAGNOSIS — C8310 Mantle cell lymphoma, unspecified site: Secondary | ICD-10-CM

## 2017-10-19 DIAGNOSIS — J90 Pleural effusion, not elsewhere classified: Secondary | ICD-10-CM | POA: Insufficient documentation

## 2017-10-19 DIAGNOSIS — J439 Emphysema, unspecified: Secondary | ICD-10-CM | POA: Diagnosis not present

## 2017-10-19 DIAGNOSIS — I2510 Atherosclerotic heart disease of native coronary artery without angina pectoris: Secondary | ICD-10-CM | POA: Diagnosis not present

## 2017-10-19 DIAGNOSIS — I700 Atherosclerosis of aorta: Secondary | ICD-10-CM | POA: Insufficient documentation

## 2017-10-19 DIAGNOSIS — S42201A Unspecified fracture of upper end of right humerus, initial encounter for closed fracture: Secondary | ICD-10-CM | POA: Diagnosis not present

## 2017-10-19 DIAGNOSIS — R601 Generalized edema: Secondary | ICD-10-CM | POA: Insufficient documentation

## 2017-10-19 DIAGNOSIS — R590 Localized enlarged lymph nodes: Secondary | ICD-10-CM | POA: Insufficient documentation

## 2017-10-19 DIAGNOSIS — X58XXXA Exposure to other specified factors, initial encounter: Secondary | ICD-10-CM | POA: Diagnosis not present

## 2017-10-19 DIAGNOSIS — N281 Cyst of kidney, acquired: Secondary | ICD-10-CM | POA: Diagnosis not present

## 2017-10-19 LAB — POCT I-STAT CREATININE: Creatinine, Ser: 1 mg/dL (ref 0.61–1.24)

## 2017-10-19 MED ORDER — IOPAMIDOL (ISOVUE-300) INJECTION 61%
100.0000 mL | Freq: Once | INTRAVENOUS | Status: AC | PRN
  Administered 2017-10-19: 100 mL via INTRAVENOUS

## 2017-10-20 ENCOUNTER — Ambulatory Visit (INDEPENDENT_AMBULATORY_CARE_PROVIDER_SITE_OTHER): Payer: Medicare Other | Admitting: Vascular Surgery

## 2017-10-20 ENCOUNTER — Ambulatory Visit (INDEPENDENT_AMBULATORY_CARE_PROVIDER_SITE_OTHER): Payer: Medicare Other

## 2017-10-20 ENCOUNTER — Encounter (INDEPENDENT_AMBULATORY_CARE_PROVIDER_SITE_OTHER): Payer: Self-pay | Admitting: Vascular Surgery

## 2017-10-20 VITALS — BP 146/82 | HR 76 | Resp 19 | Ht 69.0 in | Wt 263.0 lb

## 2017-10-20 DIAGNOSIS — R600 Localized edema: Secondary | ICD-10-CM | POA: Diagnosis not present

## 2017-10-20 DIAGNOSIS — L03115 Cellulitis of right lower limb: Secondary | ICD-10-CM | POA: Diagnosis not present

## 2017-10-20 DIAGNOSIS — E1151 Type 2 diabetes mellitus with diabetic peripheral angiopathy without gangrene: Secondary | ICD-10-CM

## 2017-10-20 DIAGNOSIS — I890 Lymphedema, not elsewhere classified: Secondary | ICD-10-CM

## 2017-10-20 NOTE — Progress Notes (Signed)
Gary [name]  [phone] [phone]  ID: [name] OB: 27-Oct-1945  MR#: [id number]  UUV#:[id number]  Patient Care Team: [name], MD as PCP - General (Internal Medicine)  CHIEF COMPLAINT: Mantle cell lymphoma.  INTERVAL HISTORY: Patient returns for annual revi clinic today for further evaluation, discussion of his imaging results, and treatment planning. He continues to feel well and is asymptomatic. He denies any fevers, chills, or night sweats. He has a good appetite and denies weight loss. He has no neurologic complaints. He denies any chest pain or shortness of breath. He has no nausea, vomiting, constipation, or diarrhea.  He denies any melena or hematochezia.  He has no urinary complaints. Patient offers no specific complaints today.  REVIEW OF SYSTEMS:   Review of Systems  Constitutional: Negative for diaphoresis, fever, malaise/fatigue and weight loss.  Respiratory: Negative.  Negative for cough and shortness of breath.   Cardiovascular: Negative.  Negative for chest pain and leg swelling.  Gastrointestinal: Negative.  Negative for abdominal pain, blood in stool and melena.  Genitourinary: Negative.  Negative for hematuria.  Musculoskeletal: Negative.   Skin: Negative.  Negative for rash.  Neurological: Negative.  Negative for sensory change and weakness.  Psychiatric/Behavioral: Negative.  The patient is not nervous/anxious.     As per HPI. Otherwise, a complete review of systems is negative.  PAST MEDICAL HISTORY: Past Medical History:  Diagnosis Date  . Depression   . 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 Gastroparesis   . Graves disease   . Hypercholesterolemia   . Hypertension   . Mantle cell lymphoma ([place])   . Peripheral neuropathy     PAST SURGICAL HISTORY: Past Surgical History:  Procedure Laterality Date  . NEPHRECTOMY     right    FAMILY  HISTORY Family History  Problem Relation Age of Onset  . Breast cancer Mother   . Diabetes Father   . Cancer Father        Lymphoma  . Alcohol abuse Maternal Uncle   . Diabetes Sister   . Heart disease Unknown        grandfather       ADVANCED DIRECTIVES:    HEALTH MAINTENANCE: Social History   Tobacco Use  . Smoking status: Former Research scientist (life sciences)  . Smokeless tobacco: Current User    Types: Chew  Substance Use Topics  . Alcohol use: Yes    Alcohol/week: 1.8 oz    Types: 3 Cans of beer per week    Comment: nightly  . Drug use: No     Colonoscopy:  PAP:  Bone density:  Lipid panel:  Allergies  Allergen Reactions  . Rofecoxib Nausea Only    Current Outpatient Medications  Medication Sig Dispense Refill  . ACCU-CHEK AVIVA PLUS test strip USE AS INSTRUCTED. USE 1 STRIP EVERY 3 HOURS . ACCU-CHECK AVIVA PLUS TEST STRIP. DX E10.649  12  . aspirin EC 325 MG tablet Take 325 mg by mouth every 6 (six) hours as needed for mild pain.     . carvedilol (COREG) 3.125 MG tablet Take by mouth.    . cephALEXin (KEFLEX) 500 MG capsule Take 1 capsule (500 mg total) by mouth 4 (four) times daily. 40 capsule 0  . ferrous sulfate 325 (65 FE) MG tablet Take 325 mg by mouth daily.    . furosemide (LASIX) 20 MG tablet Take 1 tablet (20 mg total) by mouth daily. 30 tablet 1  .  insulin aspart (NOVOLOG) 100 UNIT/ML injection Use as directed 1 vial 12  . levothyroxine (SYNTHROID, LEVOTHROID) 25 MCG tablet Take 25 mcg by mouth daily.    Marland Kitchen lisinopril (PRINIVIL,ZESTRIL) 20 MG tablet Take 20 mg by mouth daily.    . Multiple Vitamin (MULTIVITAMIN) tablet Take 1 tablet by mouth daily.    . mupirocin ointment (BACTROBAN) 2 % Apply to affected area bid 22 g 0  . naproxen (NAPROSYN) 250 MG tablet Take by mouth as needed.    . pantoprazole (PROTONIX) 40 MG tablet TAKE 1 TABLET (40 MG TOTAL) BY MOUTH DAILY. 30 tablet 0  . pravastatin (PRAVACHOL) 40 MG tablet Take 1 tablet (40 mg total) by mouth at bedtime.  Will need office visit for any more refills 30 tablet 0  . thiamine (VITAMIN B-1) 100 MG tablet Take 1 tablet (100 mg total) by mouth daily. 90 tablet 0  . traMADol (ULTRAM) 50 MG tablet Take 50 mg by mouth every 6 (six) hours as needed.  0  . acyclovir (ZOVIRAX) 400 MG tablet Take 1 tablet (400 mg total) by mouth daily. 30 tablet 3  . ondansetron (ZOFRAN) 8 MG tablet Take 1 tablet (8 mg total) by mouth 2 (two) times daily as needed for refractory nausea / vomiting. 30 tablet 2  . prochlorperazine (COMPAZINE) 10 MG tablet Take 1 tablet (10 mg total) by mouth every 6 (six) hours as needed (Nausea or vomiting). 60 tablet 2   Current Facility-Administered Medications  Medication Dose Route Frequency Provider Last Rate Last Dose  . cyanocobalamin ((VITAMIN B-12)) injection 1,000 mcg  1,000 mcg Intramuscular Once [name], MD      Race: Please given the multiple things to do this would like to do a little bit for myself before do anything  OBJECTIVE: Vitals:   10/21/17 1337  BP: (!) 153/89  Pulse: 91  Resp: 20  Temp: (!) 97.5 F (36.4 C)     Body mass index is 37.16 kg/m.    ECOG FS:0 - Asymptomatic  General: Disheveled appearing, no acute distress. Eyes: Pink conjunctiva, anicteric sclera. Lungs: Clear to auscultation bilaterally. Heart: Regular rate and rhythm. No rubs, murmurs, or gallops. Abdomen: Soft, nontender, nondistended. No organomegaly noted, normoactive bowel sounds. Musculoskeletal: No edema, cyanosis, or clubbing. Neuro: Alert, answering all questions appropriately. Cranial nerves grossly intact. Skin: No rashes or petechiae noted. Psych: Normal affect. Lymphatics: No cervical, calvicular, axillary or inguinal LAD.   LAB RESULTS:  Lab Results  Component Value Date   NA 139 10/21/2017   K 4.2 10/21/2017   CL 105 10/21/2017   CO2 27 10/21/2017   GLUCOSE 79 10/21/2017   BUN 16 10/21/2017   CREATININE 0.80 10/21/2017   CALCIUM 8.2 (L) 10/21/2017   PROT 6.9  10/21/2017   ALBUMIN 3.3 (L) 10/21/2017   AST 30 10/21/2017   ALT 19 10/21/2017   ALKPHOS 247 (H) 10/21/2017   BILITOT 0.9 10/21/2017   GFRNONAA >60 10/21/2017   GFRAA >60 10/21/2017    Lab Results  Component Value Date   WBC 5.4 10/21/2017   NEUTROABS 3.8 10/21/2017   HGB 12.0 (L) 10/21/2017   HCT 37.8 (L) 10/21/2017   MCV 86.6 10/21/2017   PLT 174 10/21/2017   Lab Results  Component Value Date   IRON 13 (L) 08/11/2017   TIBC 223 (L) 01/12/2017   IRONPCTSAT 3.8 (L) 08/11/2017   Lab Results  Component Value Date   FERRITIN 42.0 08/11/2017     STUDIES: Ct Chest W  Contrast  Result Date: 10/19/2017 CLINICAL DATA:  Low-grade mantle cell lymphoma diagnosed September 2015. Interval observation. EXAM: CT CHEST, ABDOMEN, AND PELVIS WITH CONTRAST TECHNIQUE: Multidetector CT imaging of the chest, abdomen and pelvis was performed following the standard protocol during bolus administration of intravenous contrast. CONTRAST:  178m ISOVUE-300 IOPAMIDOL (ISOVUE-300) INJECTION 61% COMPARISON:  03/17/2017 CT chest, abdomen and pelvis. FINDINGS: CT CHEST FINDINGS Cardiovascular: Normal heart size. No significant pericardial fluid/thickening. Left anterior descending and right coronary atherosclerosis. Normal course and caliber of the thoracic aorta. Main pulmonary artery diameter 3.1 cm, top-normal, stable. No central pulmonary emboli. Mediastinum/Nodes: No discrete thyroid nodules. Unremarkable esophagus. Increased bilateral axillary/retropectoral adenopathy. Representative enlarged 1.6 cm right axillary node (series 2/image 22), previously 1.0 cm, increased. Representative enlarged 1.3 cm left retropectoral node (series 2/image 19), previously 0.9 cm, increased. Newly mildly enlarged 0.8 cm left internal mammary node (series 2/image 30). Mildly enlarged right paratracheal nodes up to 1.3 cm (series 2/image 27), previously 1.1 cm, mildly increased. Enlarged 1.7 cm subcarinal node (series 2/image  34), previously 1.6 cm, slightly increased. Enlarged 1.9 cm right hilar node (series 2/image 30), previously 1.5 cm, increased. Newly enlarged 1.1 cm left hilar node (series 2/image 34). Lungs/Pleura: No pneumothorax. Small dependent bilateral pleural effusions, right greater than left. Mild hypoventilatory changes in the dependent lower lobes. Mild paraseptal emphysema. No acute consolidative airspace disease or lung masses. Two solid pulmonary nodules in the upper lobes, largest 6 mm in the medial left upper lobe (series 4/image 52), both stable. No new significant pulmonary nodules. Musculoskeletal: No aggressive appearing focal osseous lesions. Incompletely healed comminuted proximal right humerus fracture, as originally diagnosed on 08/03/2017 right shoulder radiographs. Mild thoracic spondylosis. CT ABDOMEN PELVIS FINDINGS Hepatobiliary: Normal liver size. No liver mass. Cholecystectomy. No biliary ductal dilatation. Pancreas: Normal, with no mass or duct dilation. Spleen: Normal size. No mass. Adrenals/Urinary Tract: Normal adrenals. Right nephrectomy. No mass in the right nephrectomy bed. Simple exophytic 1.2 cm lower left renal cyst. No additional left renal lesions. No left hydronephrosis. Normal bladder. Stomach/Bowel: Normal non-distended stomach. Normal caliber small bowel with no small bowel wall thickening. Normal appendix. Oral contrast transits to the left colon. Normal large bowel with no diverticulosis, large bowel wall thickening or pericolonic fat stranding. Vascular/Lymphatic: Atherosclerotic nonaneurysmal abdominal aorta. Patent portal, splenic and left renal veins. Left para-aortic adenopathy measures up to 2.7 cm (series 2/image 80), previously 1.8 cm, increased. Right paracaval adenopathy measures up to 1.8 cm (series 2/image 92), previously 1.4 cm, increased. Bulky bilateral common iliac, external iliac and inguinal adenopathy is increased. Representative 4.8 cm left common iliac node,  previously 2.5 cm, increased. Representative 3.9 cm left external iliac node (series 2/image 107), previously 2.9 cm, increased. Representative 2.8 cm right inguinal node (series 2/image 134), previously 2.1 cm, increased. Reproductive: Top-normal size prostate. Other: No pneumoperitoneum, ascites or focal fluid collection. Mild anasarca is new. Musculoskeletal: No aggressive appearing focal osseous lesions. Lumbar spondylosis. IMPRESSION: 1. Clear progression of lymphoma. Bilateral axillary, mediastinal, bilateral hilar, retroperitoneal and bulky bilateral pelvic adenopathy is all increased since 03/17/2017 CT. 2. Evidence of fluid third-spacing with small dependent bilateral pleural effusions and mild anasarca. 3. Comminuted proximal right humerus fracture is incompletely healed. 4. Chronic findings include: Aortic Atherosclerosis (ICD10-I70.0) and Emphysema (ICD10-J43.9). Coronary atherosclerosis. Electronically Signed   By: JIlona SorrelM.D.   On: 10/19/2017 13:43   Ct Abdomen Pelvis W Contrast  Result Date: 10/19/2017 CLINICAL DATA:  Low-grade mantle cell lymphoma diagnosed September 2015. Interval observation. EXAM:  CT CHEST, ABDOMEN, AND PELVIS WITH CONTRAST TECHNIQUE: Multidetector CT imaging of the chest, abdomen and pelvis was performed following the standard protocol during bolus administration of intravenous contrast. CONTRAST:  124m ISOVUE-300 IOPAMIDOL (ISOVUE-300) INJECTION 61% COMPARISON:  03/17/2017 CT chest, abdomen and pelvis. FINDINGS: CT CHEST FINDINGS Cardiovascular: Normal heart size. No significant pericardial fluid/thickening. Left anterior descending and right coronary atherosclerosis. Normal course and caliber of the thoracic aorta. Main pulmonary artery diameter 3.1 cm, top-normal, stable. No central pulmonary emboli. Mediastinum/Nodes: No discrete thyroid nodules. Unremarkable esophagus. Increased bilateral axillary/retropectoral adenopathy. Representative enlarged 1.6 cm right  axillary node (series 2/image 22), previously 1.0 cm, increased. Representative enlarged 1.3 cm left retropectoral node (series 2/image 19), previously 0.9 cm, increased. Newly mildly enlarged 0.8 cm left internal mammary node (series 2/image 30). Mildly enlarged right paratracheal nodes up to 1.3 cm (series 2/image 27), previously 1.1 cm, mildly increased. Enlarged 1.7 cm subcarinal node (series 2/image 34), previously 1.6 cm, slightly increased. Enlarged 1.9 cm right hilar node (series 2/image 30), previously 1.5 cm, increased. Newly enlarged 1.1 cm left hilar node (series 2/image 34). Lungs/Pleura: No pneumothorax. Small dependent bilateral pleural effusions, right greater than left. Mild hypoventilatory changes in the dependent lower lobes. Mild paraseptal emphysema. No acute consolidative airspace disease or lung masses. Two solid pulmonary nodules in the upper lobes, largest 6 mm in the medial left upper lobe (series 4/image 52), both stable. No new significant pulmonary nodules. Musculoskeletal: No aggressive appearing focal osseous lesions. Incompletely healed comminuted proximal right humerus fracture, as originally diagnosed on 08/03/2017 right shoulder radiographs. Mild thoracic spondylosis. CT ABDOMEN PELVIS FINDINGS Hepatobiliary: Normal liver size. No liver mass. Cholecystectomy. No biliary ductal dilatation. Pancreas: Normal, with no mass or duct dilation. Spleen: Normal size. No mass. Adrenals/Urinary Tract: Normal adrenals. Right nephrectomy. No mass in the right nephrectomy bed. Simple exophytic 1.2 cm lower left renal cyst. No additional left renal lesions. No left hydronephrosis. Normal bladder. Stomach/Bowel: Normal non-distended stomach. Normal caliber small bowel with no small bowel wall thickening. Normal appendix. Oral contrast transits to the left colon. Normal large bowel with no diverticulosis, large bowel wall thickening or pericolonic fat stranding. Vascular/Lymphatic: Atherosclerotic  nonaneurysmal abdominal aorta. Patent portal, splenic and left renal veins. Left para-aortic adenopathy measures up to 2.7 cm (series 2/image 80), previously 1.8 cm, increased. Right paracaval adenopathy measures up to 1.8 cm (series 2/image 92), previously 1.4 cm, increased. Bulky bilateral common iliac, external iliac and inguinal adenopathy is increased. Representative 4.8 cm left common iliac node, previously 2.5 cm, increased. Representative 3.9 cm left external iliac node (series 2/image 107), previously 2.9 cm, increased. Representative 2.8 cm right inguinal node (series 2/image 134), previously 2.1 cm, increased. Reproductive: Top-normal size prostate. Other: No pneumoperitoneum, ascites or focal fluid collection. Mild anasarca is new. Musculoskeletal: No aggressive appearing focal osseous lesions. Lumbar spondylosis. IMPRESSION: 1. Clear progression of lymphoma. Bilateral axillary, mediastinal, bilateral hilar, retroperitoneal and bulky bilateral pelvic adenopathy is all increased since 03/17/2017 CT. 2. Evidence of fluid third-spacing with small dependent bilateral pleural effusions and mild anasarca. 3. Comminuted proximal right humerus fracture is incompletely healed. 4. Chronic findings include: Aortic Atherosclerosis (ICD10-I70.0) and Emphysema (ICD10-J43.9). Coronary atherosclerosis. Electronically Signed   By: JIlona SorrelM.D.   On: 10/19/2017 13:43    ASSESSMENT: Low-grade mantle cell lymphoma diagnosed in September 2015, iron deficiency anemia.  PLAN:    1. Mantle cell lymphoma, stage III: Patient's initial diagnosis was on inguinal lymph node biopsy on July 12, 2014.  CT scan  results from January 2019 reviewed independently and reported as above with clear evidence of progression of disease.  No bone marrow biopsy has been performed, plus patient has declined.  This would not change his overall treatment plan.  Patient has now agreed to initiate treatment and will proceed with Rituxan on  day 1 with Treanda on days 1 and 2 every 28 days for 4-6 cycles.  Return to clinic on November 02, 2017 to initiate cycle 1, day 1 of treatment.   2. Decreased ejection fraction: Previously patient had an ejection fraction of 40% with no clinical CHF. Monitor. 3. Diabetes: Continue insulin pump as directed. 4. Thrombocytopenia: Resolved. 5. Hypertension: Continue treatment and evaluation per PCP. 6.  Iron deficiency anemia: Patient's hemoglobin has improved and is now within normal limits.  His iron stores continue to be decreased.  Patient last received IV Feraheme on September 28, 2017.  Will consider additional IV Feraheme along with his treatment for mantle cell as above.    Approximately 30 minutes was spent in discussion of which greater than 50% was consultation.  Patient expressed understanding and was in agreement with this plan. He also understands that He can call clinic at any time with any questions, concerns, or complaints.    Lloyd Huger, MD   10/24/2017 11:02 AM

## 2017-10-20 NOTE — Progress Notes (Signed)
Subjective:    [name], male    DOB: 04/06/1946, 72 y.o.   MRN: [id number] Chief Complaint  Patient presents with  . Follow-up    Venous reflux and ABI f/u-unna change   Patient presents for a monthly Unna boot therapy/wound follow-up.  Since his initial visit approximately 1 month ago the patient has been treated with bilateral 3 layer zinc oxide Unna wraps in an effort to control his lower extremity edema and weeping ulcerations.  The patient presents today without complaint.  The patient underwent a bilateral ABI which was notable for triphasic tibials, with toe brachial indices were normal.  No previous ABI available for comparison.  The patient also underwent a bilateral lower extremity venous reflux exam which was notable for no evidence of deep vein thrombosis or superficial thrombophlebitis of the bilateral lower extremities.  No venous insufficiency in the deep or superficial system.  The patient reports improvement to the cellulitis he was diagnosed with last visit.  Patient denies any fever, nausea vomiting.   Review of Systems  Constitutional: Negative.   HENT: Negative.   Eyes: Negative.   Respiratory: Negative.   Cardiovascular: Positive for leg swelling.  Gastrointestinal: Negative.   Endocrine: Negative.   Genitourinary: Negative.   Musculoskeletal: Negative.   Skin: Positive for wound.  Allergic/Immunologic: Negative.   Neurological: Negative.   Hematological: Negative.   Psychiatric/Behavioral: Negative.       Objective:   Physical Exam  Constitutional: He is oriented to person, place, and time. He appears well-developed and well-nourished. No distress.  Poor hygiene.  Poor dentition.   HENT:  Head: Normocephalic and atraumatic.  Eyes: Conjunctivae are normal. Pupils are equal, round, and reactive to light.  Neck: Normal range of motion.  Cardiovascular: Normal rate, regular rhythm, normal heart sounds and intact distal pulses.  Pulses:  Radial pulses are 2+ on the right side, and 2+ on the left side.  Hard to palpate pedal pulses due to body habitus and edema however his bilateral feet are warm  Pulmonary/Chest: Effort normal and breath sounds normal.  Musculoskeletal: Normal range of motion. He exhibits edema (Moderate nonpitting edema noted bilaterally).  Neurological: He is alert and oriented to person, place, and time.  Skin: He is not diaphoretic.  Left lower extremity: Ulcerations have healed. Right lower extremity: There is a 3 cm x 2 cm shallow non-acted ulceration noted to the front of the right shin.  Granulation tissue noted to the wound bed.  There is no drainage. There is no cellulitis noted to the bilateral lower extremity.  Psychiatric: He has a normal mood and affect. His behavior is normal. Judgment and thought content normal.  Vitals reviewed.  BP (!) 146/82 (BP Location: Right Arm, Patient Position: Sitting)   Pulse 76   Resp 19   Ht 5\' 9"  (1.753 m)   Wt 263 lb (119.3 kg)   BMI 38.84 kg/m   Past Medical History:  Diagnosis Date  . Depression   . 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 Gastroparesis   . Graves disease   . Hypercholesterolemia   . Hypertension   . Mantle cell lymphoma ([place])   . Peripheral neuropathy    Social History   Socioeconomic History  . Marital status: Married    Spouse name: Not on file  . Number of children: 1  . Years of education: Not on file  . Highest education level: Not on  file  Social Needs  . Financial resource strain: Not on file  . Food insecurity - worry: Not on file  . Food insecurity - inability: Not on file  . Transportation needs - medical: Not on file  . Transportation needs - non-medical: Not on file  Occupational History  . Not on file  Tobacco Use  . Smoking status: Former Research scientist (life sciences)  . Smokeless tobacco: Current User    Types: Chew  Substance and Sexual Activity  . Alcohol use: Yes     Alcohol/week: 1.8 oz    Types: 3 Cans of beer per week    Comment: nightly  . Drug use: No  . Sexual activity: Not Currently  Other Topics Concern  . Not on file  Social History Narrative  . Not on file   Past Surgical History:  Procedure Laterality Date  . NEPHRECTOMY     right   Family History  Problem Relation Age of Onset  . Breast cancer Mother   . Diabetes Father   . Cancer Father        Lymphoma  . Alcohol abuse Maternal Uncle   . Diabetes Sister   . Heart disease Unknown        grandfather   Allergies  Allergen Reactions  . Rofecoxib Nausea Only      Assessment & Plan:  Patient presents for a monthly Unna boot therapy/wound follow-up.  Since his initial visit approximately 1 month ago the patient has been treated with bilateral 3 layer zinc oxide Unna wraps in an effort to control his lower extremity edema and weeping ulcerations.  The patient presents today without complaint.  The patient underwent a bilateral ABI which was notable for triphasic tibials, with toe brachial indices were normal.  No previous ABI available for comparison.  The patient also underwent a bilateral lower extremity venous reflux exam which was notable for no evidence of deep vein thrombosis or superficial thrombophlebitis of the bilateral lower extremities.  No venous insufficiency in the deep or superficial system.  The patient reports improvement to the cellulitis he was diagnosed with last visit.  Patient denies any fever, nausea vomiting.  1. Lymphedema - New Despite conservative treatments including exercise, elevation and 3 layer zinc oxide Unna boots to the bilateral lower extremity the patient still presents with age 72 lymphedema The patient would greatly benefit from the added therapy of a lymphedema pump I will continue to place the patient in 3 layer zinc oxide Unna wraps to the bilateral lower extremity to control his edema and he will the right shin ulceration. I had a long  discussion with the patient about appropriate elevation as heart level or higher We discussed how to use lymphedema pump.  He understands that he will need to use this at least twice a day for an hour each time with his legs elevated and either compression stockings or Unna wraps. Him and his wife expressed their understanding  2. Type 2 diabetes mellitus with diabetic peripheral angiopathy without gangrene, without long-term current use of insulin (HCC) - Stable Encouraged good control as its slows the progression of atherosclerotic disease  3. Cellulitis of right lower extremity - Resolved Patient's cellulitis has resolved since last visit. The patient understands that if he does not control the edema to the bilateral lower extremity he will be at risk for recurrent cellulitis He expresses his understanding  Current Outpatient Medications on File Prior to Visit  Medication Sig Dispense Refill  . ACCU-CHEK AVIVA PLUS  test strip USE AS INSTRUCTED. USE 1 STRIP EVERY 3 HOURS . ACCU-CHECK AVIVA PLUS TEST STRIP. DX E10.649  12  . aspirin EC 325 MG tablet Take 325 mg by mouth every 6 (six) hours as needed for mild pain.     . carvedilol (COREG) 3.125 MG tablet Take by mouth.    . ferrous sulfate 325 (65 FE) MG tablet Take 325 mg by mouth daily.    . furosemide (LASIX) 20 MG tablet Take 1 tablet (20 mg total) by mouth daily. 30 tablet 1  . insulin aspart (NOVOLOG) 100 UNIT/ML injection Use as directed 1 vial 12  . levothyroxine (SYNTHROID, LEVOTHROID) 25 MCG tablet Take 25 mcg by mouth daily.    Marland Kitchen lisinopril (PRINIVIL,ZESTRIL) 20 MG tablet Take 20 mg by mouth daily.    . Multiple Vitamin (MULTIVITAMIN) tablet Take 1 tablet by mouth daily.    . mupirocin ointment (BACTROBAN) 2 % Apply to affected area bid 22 g 0  . naproxen (NAPROSYN) 250 MG tablet Take by mouth as needed.    . pantoprazole (PROTONIX) 40 MG tablet TAKE 1 TABLET (40 MG TOTAL) BY MOUTH DAILY. 30 tablet 0  . pravastatin (PRAVACHOL)  40 MG tablet Take 1 tablet (40 mg total) by mouth at bedtime. Will need office visit for any more refills 30 tablet 0  . thiamine (VITAMIN B-1) 100 MG tablet Take 1 tablet (100 mg total) by mouth daily. 90 tablet 0  . traMADol (ULTRAM) 50 MG tablet Take 50 mg by mouth every 6 (six) hours as needed.  0  . cephALEXin (KEFLEX) 500 MG capsule Take 1 capsule (500 mg total) by mouth 4 (four) times daily. (Patient not taking: Reported on 10/20/2017) 40 capsule 0   No current facility-administered medications on file prior to visit.    There are no Patient Instructions on file for this visit. No Follow-up on file.  Kyleah Pensabene A Othmar Ringer, PA-C

## 2017-10-21 ENCOUNTER — Ambulatory Visit (INDEPENDENT_AMBULATORY_CARE_PROVIDER_SITE_OTHER): Payer: Medicare Other | Admitting: Internal Medicine

## 2017-10-21 ENCOUNTER — Other Ambulatory Visit: Payer: Self-pay

## 2017-10-21 ENCOUNTER — Inpatient Hospital Stay: Payer: Medicare Other | Attending: Oncology

## 2017-10-21 ENCOUNTER — Encounter: Payer: Self-pay | Admitting: Internal Medicine

## 2017-10-21 ENCOUNTER — Inpatient Hospital Stay (HOSPITAL_BASED_OUTPATIENT_CLINIC_OR_DEPARTMENT_OTHER): Payer: Medicare Other | Admitting: Oncology

## 2017-10-21 ENCOUNTER — Inpatient Hospital Stay: Payer: Medicare Other

## 2017-10-21 VITALS — BP 130/70 | HR 97 | Temp 97.6°F | Ht 71.0 in | Wt 261.0 lb

## 2017-10-21 VITALS — BP 153/89 | HR 91 | Temp 97.5°F | Resp 20 | Wt 266.4 lb

## 2017-10-21 DIAGNOSIS — C8318 Mantle cell lymphoma, lymph nodes of multiple sites: Secondary | ICD-10-CM

## 2017-10-21 DIAGNOSIS — Z803 Family history of malignant neoplasm of breast: Secondary | ICD-10-CM | POA: Diagnosis not present

## 2017-10-21 DIAGNOSIS — C8310 Mantle cell lymphoma, unspecified site: Secondary | ICD-10-CM | POA: Diagnosis not present

## 2017-10-21 DIAGNOSIS — E11311 Type 2 diabetes mellitus with unspecified diabetic retinopathy with macular edema: Secondary | ICD-10-CM | POA: Diagnosis not present

## 2017-10-21 DIAGNOSIS — E079 Disorder of thyroid, unspecified: Secondary | ICD-10-CM

## 2017-10-21 DIAGNOSIS — Z905 Acquired absence of kidney: Secondary | ICD-10-CM | POA: Diagnosis not present

## 2017-10-21 DIAGNOSIS — Z79899 Other long term (current) drug therapy: Secondary | ICD-10-CM

## 2017-10-21 DIAGNOSIS — Z87891 Personal history of nicotine dependence: Secondary | ICD-10-CM

## 2017-10-21 DIAGNOSIS — F439 Reaction to severe stress, unspecified: Secondary | ICD-10-CM | POA: Diagnosis not present

## 2017-10-21 DIAGNOSIS — I2510 Atherosclerotic heart disease of native coronary artery without angina pectoris: Secondary | ICD-10-CM

## 2017-10-21 DIAGNOSIS — E119 Type 2 diabetes mellitus without complications: Secondary | ICD-10-CM

## 2017-10-21 DIAGNOSIS — F4321 Adjustment disorder with depressed mood: Secondary | ICD-10-CM

## 2017-10-21 DIAGNOSIS — Z9181 History of falling: Secondary | ICD-10-CM | POA: Insufficient documentation

## 2017-10-21 DIAGNOSIS — I10 Essential (primary) hypertension: Secondary | ICD-10-CM

## 2017-10-21 DIAGNOSIS — D649 Anemia, unspecified: Secondary | ICD-10-CM

## 2017-10-21 DIAGNOSIS — E7800 Pure hypercholesterolemia, unspecified: Secondary | ICD-10-CM

## 2017-10-21 DIAGNOSIS — Z7982 Long term (current) use of aspirin: Secondary | ICD-10-CM

## 2017-10-21 DIAGNOSIS — Z5111 Encounter for antineoplastic chemotherapy: Secondary | ICD-10-CM | POA: Diagnosis not present

## 2017-10-21 DIAGNOSIS — G629 Polyneuropathy, unspecified: Secondary | ICD-10-CM | POA: Insufficient documentation

## 2017-10-21 DIAGNOSIS — Z807 Family history of other malignant neoplasms of lymphoid, hematopoietic and related tissues: Secondary | ICD-10-CM | POA: Insufficient documentation

## 2017-10-21 DIAGNOSIS — E1151 Type 2 diabetes mellitus with diabetic peripheral angiopathy without gangrene: Secondary | ICD-10-CM

## 2017-10-21 DIAGNOSIS — E538 Deficiency of other specified B group vitamins: Secondary | ICD-10-CM

## 2017-10-21 DIAGNOSIS — E1143 Type 2 diabetes mellitus with diabetic autonomic (poly)neuropathy: Secondary | ICD-10-CM | POA: Diagnosis not present

## 2017-10-21 DIAGNOSIS — I890 Lymphedema, not elsewhere classified: Secondary | ICD-10-CM

## 2017-10-21 DIAGNOSIS — E11649 Type 2 diabetes mellitus with hypoglycemia without coma: Secondary | ICD-10-CM | POA: Insufficient documentation

## 2017-10-21 DIAGNOSIS — E0500 Thyrotoxicosis with diffuse goiter without thyrotoxic crisis or storm: Secondary | ICD-10-CM | POA: Diagnosis not present

## 2017-10-21 DIAGNOSIS — S42294D Other nondisplaced fracture of upper end of right humerus, subsequent encounter for fracture with routine healing: Secondary | ICD-10-CM

## 2017-10-21 DIAGNOSIS — Z794 Long term (current) use of insulin: Secondary | ICD-10-CM | POA: Diagnosis not present

## 2017-10-21 DIAGNOSIS — D509 Iron deficiency anemia, unspecified: Secondary | ICD-10-CM

## 2017-10-21 DIAGNOSIS — K219 Gastro-esophageal reflux disease without esophagitis: Secondary | ICD-10-CM | POA: Diagnosis not present

## 2017-10-21 DIAGNOSIS — R600 Localized edema: Secondary | ICD-10-CM | POA: Diagnosis not present

## 2017-10-21 DIAGNOSIS — Z23 Encounter for immunization: Secondary | ICD-10-CM | POA: Diagnosis not present

## 2017-10-21 DIAGNOSIS — Z7189 Other specified counseling: Secondary | ICD-10-CM

## 2017-10-21 LAB — CBC WITH DIFFERENTIAL/PLATELET
BASOS ABS: 0.1 10*3/uL (ref 0–0.1)
BASOS PCT: 1 %
EOS ABS: 0.2 10*3/uL (ref 0–0.7)
EOS PCT: 4 %
HCT: 37.8 % — ABNORMAL LOW (ref 40.0–52.0)
Hemoglobin: 12 g/dL — ABNORMAL LOW (ref 13.0–18.0)
Lymphocytes Relative: 15 %
Lymphs Abs: 0.8 10*3/uL — ABNORMAL LOW (ref 1.0–3.6)
MCH: 27.5 pg (ref 26.0–34.0)
MCHC: 31.8 g/dL — ABNORMAL LOW (ref 32.0–36.0)
MCV: 86.6 fL (ref 80.0–100.0)
MONO ABS: 0.5 10*3/uL (ref 0.2–1.0)
Monocytes Relative: 10 %
Neutro Abs: 3.8 10*3/uL (ref 1.4–6.5)
Neutrophils Relative %: 70 %
PLATELETS: 174 10*3/uL (ref 150–440)
RBC: 4.37 MIL/uL — AB (ref 4.40–5.90)
RDW: 23.5 % — AB (ref 11.5–14.5)
WBC: 5.4 10*3/uL (ref 3.8–10.6)

## 2017-10-21 LAB — COMPREHENSIVE METABOLIC PANEL
ALBUMIN: 3.3 g/dL — AB (ref 3.5–5.0)
ALK PHOS: 247 U/L — AB (ref 38–126)
ALT: 19 U/L (ref 17–63)
AST: 30 U/L (ref 15–41)
Anion gap: 7 (ref 5–15)
BILIRUBIN TOTAL: 0.9 mg/dL (ref 0.3–1.2)
BUN: 16 mg/dL (ref 6–20)
CALCIUM: 8.2 mg/dL — AB (ref 8.9–10.3)
CO2: 27 mmol/L (ref 22–32)
CREATININE: 0.8 mg/dL (ref 0.61–1.24)
Chloride: 105 mmol/L (ref 101–111)
GFR calc Af Amer: >60 mL/min (ref >60)
GLUCOSE: 79 mg/dL (ref 65–99)
POTASSIUM: 4.2 mmol/L (ref 3.5–5.1)
Sodium: 139 mmol/L (ref 135–145)
TOTAL PROTEIN: 6.9 g/dL (ref 6.5–8.1)

## 2017-10-21 LAB — LACTATE DEHYDROGENASE: LDH: 170 U/L (ref 98–192)

## 2017-10-21 MED ORDER — CYANOCOBALAMIN 1000 MCG/ML IJ SOLN: 1000.0000 ug | Freq: Once | INTRAMUSCULAR | Status: DC

## 2017-10-21 NOTE — Progress Notes (Signed)
Patient denies any concerns today.  

## 2017-10-21 NOTE — Progress Notes (Signed)
Pre visit review using our clinic review tool, if applicable. No additional management support is needed unless otherwise documented below in the visit note. 

## 2017-10-21 NOTE — Progress Notes (Signed)
[name], male   DOB: 25-Nov-1945, 72 y.o.   MRN: [id number]   Subjective:    [name], male    DOB: Nov 10, 1945, 73 y.o.   MRN: [id number]  HPI  Patient here for a scheduled follow up. He reports he is doing better.  Seeing oncology.  Has f/u today.  Plans to discuss his recent CT scan.  Also saw Dr Nehemiah Massed 10/08/17.  Diuretics.  Placed on beta blocker.  Overall stable.  Recommended f/u in 4 weeks.  No chest pain.  Breathing stable.  Reports no abdominal pain.  Bowels moving.  Seeing [facility].  [name] Boots.  Planning for lymphedema.  Legs wrapped.  Swelling has improved from the last check here.  No low sugars recently.  Still seeing endocrinology.  Has been seeing ortho - humerus fracture.  Has been released.     Past Medical History:  Diagnosis Date  . Depression   . 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 Gastroparesis   . Graves disease   . Hypercholesterolemia   . Hypertension   . Mantle cell lymphoma ([place])   . Peripheral neuropathy    Past Surgical History:  Procedure Laterality Date  . NEPHRECTOMY     right   Family History  Problem Relation Age of Onset  . Breast cancer Mother   . Diabetes Father   . Cancer Father        Lymphoma  . Alcohol abuse Maternal Uncle   . Diabetes Sister   . Heart disease Unknown        grandfather   Social History   Socioeconomic History  . Marital status: Married    Spouse name: None  . Number of children: 1  . Years of education: None  . Highest education level: None  Social Needs  . Financial resource strain: None  . Food insecurity - worry: None  . Food insecurity - inability: None  . Transportation needs - medical: None  . Transportation needs - non-medical: None  Occupational History  . None  Tobacco Use  . Smoking status: Former Research scientist (life sciences)  . Smokeless tobacco: Current User    Types: Chew  Substance and Sexual Activity  . Alcohol use: Yes   Alcohol/week: 1.8 oz    Types: 3 Cans of beer per week    Comment: nightly  . Drug use: No  . Sexual activity: Not Currently  Other Topics Concern  . None  Social History Narrative  . None    Outpatient Encounter Medications as of 10/21/2017  Medication Sig  . ACCU-CHEK AVIVA PLUS test strip USE AS INSTRUCTED. USE 1 STRIP EVERY 3 HOURS . ACCU-CHECK AVIVA PLUS TEST STRIP. DX S50.539  . aspirin EC 325 MG tablet Take 325 mg by mouth every 6 (six) hours as needed for mild pain.   . carvedilol (COREG) 3.125 MG tablet Take by mouth.  . cephALEXin (KEFLEX) 500 MG capsule Take 1 capsule (500 mg total) by mouth 4 (four) times daily.  . ferrous sulfate 325 (65 FE) MG tablet Take 325 mg by mouth daily.  . furosemide (LASIX) 20 MG tablet Take 1 tablet (20 mg total) by mouth daily.  . insulin aspart (NOVOLOG) 100 UNIT/ML injection Use as directed  . levothyroxine (SYNTHROID, LEVOTHROID) 25 MCG tablet Take 25 mcg by mouth daily.  Marland Kitchen lisinopril (PRINIVIL,ZESTRIL) 20 MG tablet Take 20 mg by mouth daily.  . Multiple Vitamin (MULTIVITAMIN) tablet  Take 1 tablet by mouth daily.  . mupirocin ointment (BACTROBAN) 2 % Apply to affected area bid  . naproxen (NAPROSYN) 250 MG tablet Take by mouth as needed.  . pantoprazole (PROTONIX) 40 MG tablet TAKE 1 TABLET (40 MG TOTAL) BY MOUTH DAILY.  . pravastatin (PRAVACHOL) 40 MG tablet Take 1 tablet (40 mg total) by mouth at bedtime. Will need office visit for any more refills  . thiamine (VITAMIN B-1) 100 MG tablet Take 1 tablet (100 mg total) by mouth daily.  . traMADol (ULTRAM) 50 MG tablet Take 50 mg by mouth every 6 (six) hours as needed.   Facility-Administered Encounter Medications as of 10/21/2017  Medication  . cyanocobalamin ((VITAMIN B-12)) injection 1,000 mcg    Review of Systems  Constitutional: Negative for appetite change and unexpected weight change.  HENT: Negative for congestion and sinus pressure.   Respiratory: Negative for cough and chest  tightness.        Breathing stable.    Cardiovascular: Positive for leg swelling. Negative for chest pain and palpitations.  Gastrointestinal: Negative for diarrhea, nausea and vomiting.  Genitourinary: Negative for difficulty urinating and dysuria.  Musculoskeletal: Negative for myalgias.       Being followed by humerus fracture.  Stable.    Skin: Negative for color change and rash.  Neurological: Negative for dizziness, light-headedness and headaches.  Psychiatric/Behavioral: Negative for agitation and dysphoric mood.       Objective:    Physical Exam  Constitutional: He appears well-developed and well-nourished. No distress.  HENT:  Nose: Nose normal.  Mouth/Throat: Oropharynx is clear and moist.  Neck: Neck supple. No thyromegaly present.  Cardiovascular: Normal rate and regular rhythm.  Pulmonary/Chest: Effort normal and breath sounds normal. No respiratory distress.  Abdominal: Soft. Bowel sounds are normal. There is no tenderness.  Musculoskeletal: He exhibits no tenderness.  Unna boots in place.    Lymphadenopathy:    He has no cervical adenopathy.  Skin: No rash noted. No erythema.  Psychiatric: He has a normal mood and affect. His behavior is normal.    BP 130/70   Pulse 97   Temp 97.6 F (36.4 C) (Oral)   Ht 5\' 11"  (1.803 m)   Wt 261 lb (118.4 kg)   SpO2 97%   BMI 36.40 kg/m  Wt Readings from Last 3 Encounters:  10/22/17 266 lb 12.8 oz (121 kg)  10/21/17 266 lb 6.4 oz (120.8 kg)  10/21/17 261 lb (118.4 kg)     Lab Results  Component Value Date   WBC 5.4 10/21/2017   HGB 12.0 (L) 10/21/2017   HCT 37.8 (L) 10/21/2017   PLT 174 10/21/2017   GLUCOSE 79 10/21/2017   CHOL 197 08/09/2015   TRIG 51.0 08/09/2015   HDL 73.60 08/09/2015   LDLCALC 113 (H) 08/09/2015   ALT 19 10/21/2017   AST 30 10/21/2017   NA 139 10/21/2017   K 4.2 10/21/2017   CL 105 10/21/2017   CREATININE 0.80 10/21/2017   BUN 16 10/21/2017   CO2 27 10/21/2017   TSH 8.444 (H)  01/12/2017   INR 1.17 01/12/2017   HGBA1C 7.6 05/18/2017   MICROALBUR 1.9 09/22/2016    Ct Chest W Contrast  Result Date: 10/19/2017 CLINICAL DATA:  Low-grade mantle cell lymphoma diagnosed September 2015. Interval observation. EXAM: CT CHEST, ABDOMEN, AND PELVIS WITH CONTRAST TECHNIQUE: Multidetector CT imaging of the chest, abdomen and pelvis was performed following the standard protocol during bolus administration of intravenous contrast. CONTRAST:  141mL ISOVUE-300  IOPAMIDOL (ISOVUE-300) INJECTION 61% COMPARISON:  03/17/2017 CT chest, abdomen and pelvis. FINDINGS: CT CHEST FINDINGS Cardiovascular: Normal heart size. No significant pericardial fluid/thickening. Left anterior descending and right coronary atherosclerosis. Normal course and caliber of the thoracic aorta. Main pulmonary artery diameter 3.1 cm, top-normal, stable. No central pulmonary emboli. Mediastinum/Nodes: No discrete thyroid nodules. Unremarkable esophagus. Increased bilateral axillary/retropectoral adenopathy. Representative enlarged 1.6 cm right axillary node (series 2/image 22), previously 1.0 cm, increased. Representative enlarged 1.3 cm left retropectoral node (series 2/image 19), previously 0.9 cm, increased. Newly mildly enlarged 0.8 cm left internal mammary node (series 2/image 30). Mildly enlarged right paratracheal nodes up to 1.3 cm (series 2/image 27), previously 1.1 cm, mildly increased. Enlarged 1.7 cm subcarinal node (series 2/image 34), previously 1.6 cm, slightly increased. Enlarged 1.9 cm right hilar node (series 2/image 30), previously 1.5 cm, increased. Newly enlarged 1.1 cm left hilar node (series 2/image 34). Lungs/Pleura: No pneumothorax. Small dependent bilateral pleural effusions, right greater than left. Mild hypoventilatory changes in the dependent lower lobes. Mild paraseptal emphysema. No acute consolidative airspace disease or lung masses. Two solid pulmonary nodules in the upper lobes, largest 6 mm in the  medial left upper lobe (series 4/image 52), both stable. No new significant pulmonary nodules. Musculoskeletal: No aggressive appearing focal osseous lesions. Incompletely healed comminuted proximal right humerus fracture, as originally diagnosed on 08/03/2017 right shoulder radiographs. Mild thoracic spondylosis. CT ABDOMEN PELVIS FINDINGS Hepatobiliary: Normal liver size. No liver mass. Cholecystectomy. No biliary ductal dilatation. Pancreas: Normal, with no mass or duct dilation. Spleen: Normal size. No mass. Adrenals/Urinary Tract: Normal adrenals. Right nephrectomy. No mass in the right nephrectomy bed. Simple exophytic 1.2 cm lower left renal cyst. No additional left renal lesions. No left hydronephrosis. Normal bladder. Stomach/Bowel: Normal non-distended stomach. Normal caliber small bowel with no small bowel wall thickening. Normal appendix. Oral contrast transits to the left colon. Normal large bowel with no diverticulosis, large bowel wall thickening or pericolonic fat stranding. Vascular/Lymphatic: Atherosclerotic nonaneurysmal abdominal aorta. Patent portal, splenic and left renal veins. Left para-aortic adenopathy measures up to 2.7 cm (series 2/image 80), previously 1.8 cm, increased. Right paracaval adenopathy measures up to 1.8 cm (series 2/image 92), previously 1.4 cm, increased. Bulky bilateral common iliac, external iliac and inguinal adenopathy is increased. Representative 4.8 cm left common iliac node, previously 2.5 cm, increased. Representative 3.9 cm left external iliac node (series 2/image 107), previously 2.9 cm, increased. Representative 2.8 cm right inguinal node (series 2/image 134), previously 2.1 cm, increased. Reproductive: Top-normal size prostate. Other: No pneumoperitoneum, ascites or focal fluid collection. Mild anasarca is new. Musculoskeletal: No aggressive appearing focal osseous lesions. Lumbar spondylosis. IMPRESSION: 1. Clear progression of lymphoma. Bilateral axillary,  mediastinal, bilateral hilar, retroperitoneal and bulky bilateral pelvic adenopathy is all increased since 03/17/2017 CT. 2. Evidence of fluid third-spacing with small dependent bilateral pleural effusions and mild anasarca. 3. Comminuted proximal right humerus fracture is incompletely healed. 4. Chronic findings include: Aortic Atherosclerosis (ICD10-I70.0) and Emphysema (ICD10-J43.9). Coronary atherosclerosis. Electronically Signed   By: Ilona Sorrel M.D.   On: 10/19/2017 13:43   Ct Abdomen Pelvis W Contrast  Result Date: 10/19/2017 CLINICAL DATA:  Low-grade mantle cell lymphoma diagnosed September 2015. Interval observation. EXAM: CT CHEST, ABDOMEN, AND PELVIS WITH CONTRAST TECHNIQUE: Multidetector CT imaging of the chest, abdomen and pelvis was performed following the standard protocol during bolus administration of intravenous contrast. CONTRAST:  162mL ISOVUE-300 IOPAMIDOL (ISOVUE-300) INJECTION 61% COMPARISON:  03/17/2017 CT chest, abdomen and pelvis. FINDINGS: CT CHEST FINDINGS Cardiovascular: Normal  heart size. No significant pericardial fluid/thickening. Left anterior descending and right coronary atherosclerosis. Normal course and caliber of the thoracic aorta. Main pulmonary artery diameter 3.1 cm, top-normal, stable. No central pulmonary emboli. Mediastinum/Nodes: No discrete thyroid nodules. Unremarkable esophagus. Increased bilateral axillary/retropectoral adenopathy. Representative enlarged 1.6 cm right axillary node (series 2/image 22), previously 1.0 cm, increased. Representative enlarged 1.3 cm left retropectoral node (series 2/image 19), previously 0.9 cm, increased. Newly mildly enlarged 0.8 cm left internal mammary node (series 2/image 30). Mildly enlarged right paratracheal nodes up to 1.3 cm (series 2/image 27), previously 1.1 cm, mildly increased. Enlarged 1.7 cm subcarinal node (series 2/image 34), previously 1.6 cm, slightly increased. Enlarged 1.9 cm right hilar node (series 2/image  30), previously 1.5 cm, increased. Newly enlarged 1.1 cm left hilar node (series 2/image 34). Lungs/Pleura: No pneumothorax. Small dependent bilateral pleural effusions, right greater than left. Mild hypoventilatory changes in the dependent lower lobes. Mild paraseptal emphysema. No acute consolidative airspace disease or lung masses. Two solid pulmonary nodules in the upper lobes, largest 6 mm in the medial left upper lobe (series 4/image 52), both stable. No new significant pulmonary nodules. Musculoskeletal: No aggressive appearing focal osseous lesions. Incompletely healed comminuted proximal right humerus fracture, as originally diagnosed on 08/03/2017 right shoulder radiographs. Mild thoracic spondylosis. CT ABDOMEN PELVIS FINDINGS Hepatobiliary: Normal liver size. No liver mass. Cholecystectomy. No biliary ductal dilatation. Pancreas: Normal, with no mass or duct dilation. Spleen: Normal size. No mass. Adrenals/Urinary Tract: Normal adrenals. Right nephrectomy. No mass in the right nephrectomy bed. Simple exophytic 1.2 cm lower left renal cyst. No additional left renal lesions. No left hydronephrosis. Normal bladder. Stomach/Bowel: Normal non-distended stomach. Normal caliber small bowel with no small bowel wall thickening. Normal appendix. Oral contrast transits to the left colon. Normal large bowel with no diverticulosis, large bowel wall thickening or pericolonic fat stranding. Vascular/Lymphatic: Atherosclerotic nonaneurysmal abdominal aorta. Patent portal, splenic and left renal veins. Left para-aortic adenopathy measures up to 2.7 cm (series 2/image 80), previously 1.8 cm, increased. Right paracaval adenopathy measures up to 1.8 cm (series 2/image 92), previously 1.4 cm, increased. Bulky bilateral common iliac, external iliac and inguinal adenopathy is increased. Representative 4.8 cm left common iliac node, previously 2.5 cm, increased. Representative 3.9 cm left external iliac node (series 2/image  107), previously 2.9 cm, increased. Representative 2.8 cm right inguinal node (series 2/image 134), previously 2.1 cm, increased. Reproductive: Top-normal size prostate. Other: No pneumoperitoneum, ascites or focal fluid collection. Mild anasarca is new. Musculoskeletal: No aggressive appearing focal osseous lesions. Lumbar spondylosis. IMPRESSION: 1. Clear progression of lymphoma. Bilateral axillary, mediastinal, bilateral hilar, retroperitoneal and bulky bilateral pelvic adenopathy is all increased since 03/17/2017 CT. 2. Evidence of fluid third-spacing with small dependent bilateral pleural effusions and mild anasarca. 3. Comminuted proximal right humerus fracture is incompletely healed. 4. Chronic findings include: Aortic Atherosclerosis (ICD10-I70.0) and Emphysema (ICD10-J43.9). Coronary atherosclerosis. Electronically Signed   By: Ilona Sorrel M.D.   On: 10/19/2017 13:43       Assessment & Plan:   Problem List Items Addressed This Visit    Adjustment disorder with depressed mood    Stable.        Anemia    Continue B12 injections.  CBC being followed by hematology.        Relevant Medications   cyanocobalamin ((VITAMIN B-12)) injection 1,000 mcg   Bilateral lower extremity edema    Seeing [facility].  Unna boots in place. Planning for lymphedema pump.  CAD in native artery    Followed by Dr Nehemiah Massed.  Continue risk factor modification.        Diabetes ([place])    Low carb diet and exercise.  No low sugars.  Followed by Dr Eddie Dibbles.       Essential hypertension, benign    Blood pressure under good control.  Continue same medication regimen.  Follow pressures.  Follow metabolic panel.        GERD (gastroesophageal reflux disease)    No upper symptoms reported        Humerus fracture    Has been seeing ortho.  States healing now.        Hypercholesterolemia    Pravastatin.  Low cholesterol diet and exercise.  Follow lipid panel and liver function tests.        Lymphedema     Seeing [facility].  Planning for lymphedema pump.        Mantle cell lymphoma ([place])    Followed by oncology.  Just had CT.  Plans to f/u with oncology today to discuss CT results and plans for further treatment.        Stress    Stress is better. Son is in jail.  Follow.        Thyroid disease    Followed by Dr Eddie Dibbles.         Other Visit Diagnoses    B12 deficiency    -  Primary   Relevant Medications   cyanocobalamin ((VITAMIN B-12)) injection 1,000 mcg   Encounter for immunization       Relevant Orders   Flu vaccine HIGH DOSE PF (Completed)       [name], MD

## 2017-10-22 ENCOUNTER — Ambulatory Visit (INDEPENDENT_AMBULATORY_CARE_PROVIDER_SITE_OTHER): Payer: Medicare Other

## 2017-10-22 VITALS — BP 136/82 | HR 89 | Temp 97.7°F | Resp 17 | Ht 69.0 in | Wt 266.8 lb

## 2017-10-22 DIAGNOSIS — Z1159 Encounter for screening for other viral diseases: Secondary | ICD-10-CM | POA: Diagnosis not present

## 2017-10-22 DIAGNOSIS — Z0000 Encounter for general adult medical examination without abnormal findings: Secondary | ICD-10-CM

## 2017-10-22 DIAGNOSIS — Z23 Encounter for immunization: Secondary | ICD-10-CM | POA: Diagnosis not present

## 2017-10-22 NOTE — Patient Instructions (Addendum)
  [name] , Thank you for taking time to come for your Medicare Wellness Visit. I appreciate your ongoing commitment to your health goals. Please review the following plan we discussed and let me know if I can assist you in the future.   These are the goals we discussed: Goals    . Healthy Lifestyle     Low carb foods Stay hydrated Exercise (walking as tolerated/chair exercises as demonstrated)       This is a list of the screening recommended for you and due dates:  Health Maintenance  Topic Date Due  .  Hepatitis C: One time screening is recommended by Center for Disease Control  (CDC) for  adults born from [birth year] through 1963.   03-Mar-1946  . Complete foot exam   07/07/1958  . Tetanus Vaccine  07/08/1967  . Eye exam for diabetics  05/23/2015  . Pneumonia vaccines (2 of 2 - PPSV23) 12/26/2015  . Hemoglobin A1C  11/18/2017  . Colon Cancer Screening  01/09/2024  . Flu Shot  Completed

## 2017-10-22 NOTE — Progress Notes (Signed)
Subjective:   Gary [name] is a 72 y.o. male who presents for Medicare Annual/Subsequent preventive examination.  Review of Systems:  No ROS.  Medicare Wellness Visit. Additional risk factors are reflected in the social history.  Cardiac Risk Factors include: male gender;diabetes mellitus;advanced age (>70men, >42 women);hypertension;smoking/ tobacco exposure;obesity (BMI >30kg/m2)     Objective:    Vitals: BP 136/82 (BP Location: Left Arm, Patient Position: Sitting, Cuff Size: Normal)   Pulse 89   Temp 97.7 F (36.5 C) (Oral)   Resp 17   Ht 5\' 9"  (1.753 m)   Wt 266 lb 12.8 oz (121 kg)   SpO2 96%   BMI 39.40 kg/m   Body mass index is 39.4 kg/m.  Advanced Directives 10/22/2017 10/21/2017 08/03/2017 07/28/2017 01/12/2017 01/11/2017 10/22/2016  Does Patient Have a Medical Advance Directive? No No No No No No No  Would patient like information on creating a medical advance directive? No - Patient declined No - Patient declined - - No - Patient declined No - Patient declined No - Patient declined    Tobacco Social History   Tobacco Use  Smoking Status Former Smoker  Smokeless Tobacco Current User  . Types: Chew     Ready to quit: No Counseling given: Not Answered   Clinical Intake:  Pre-visit preparation completed: Yes  Pain : No/denies pain     Nutritional Status: BMI > 30  Obese Diabetes: Yes(Followed by Dr. Eddie Dibbles)  How often do you need to have someone help you when you read instructions, pamphlets, or other written materials from your doctor or pharmacy?: 1 - Never  Interpreter Needed?: No     Past Medical History:  Diagnosis Date  . Depression   . 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 Gastroparesis   . Graves disease   . Hypercholesterolemia   . Hypertension   . Mantle cell lymphoma ([place])   . Peripheral neuropathy    Past Surgical History:  Procedure Laterality Date  . NEPHRECTOMY     right    Family History  Problem Relation Age of Onset  . Breast cancer Mother   . Diabetes Father   . Cancer Father        Lymphoma  . Alcohol abuse Maternal Uncle   . Diabetes Sister   . Heart disease Unknown        grandfather   Social History   Socioeconomic History  . Marital status: Married    Spouse name: None  . Number of children: 1  . Years of education: None  . Highest education level: None  Social Needs  . Financial resource strain: None  . Food insecurity - worry: None  . Food insecurity - inability: None  . Transportation needs - medical: None  . Transportation needs - non-medical: None  Occupational History  . None  Tobacco Use  . Smoking status: Former Research scientist (life sciences)  . Smokeless tobacco: Current User    Types: Chew  Substance and Sexual Activity  . Alcohol use: Yes    Alcohol/week: 1.8 oz    Types: 3 Cans of beer per week    Comment: nightly  . Drug use: No  . Sexual activity: Not Currently  Other Topics Concern  . None  Social History Narrative  . None    Outpatient Encounter Medications as of 10/22/2017  Medication Sig  . ACCU-CHEK AVIVA PLUS test strip USE AS INSTRUCTED. USE 1 STRIP EVERY 3 HOURS .  ACCU-CHECK AVIVA PLUS TEST STRIP. DX X52.841  . aspirin EC 325 MG tablet Take 325 mg by mouth every 6 (six) hours as needed for mild pain.   . carvedilol (COREG) 3.125 MG tablet Take by mouth.  . cephALEXin (KEFLEX) 500 MG capsule Take 1 capsule (500 mg total) by mouth 4 (four) times daily.  . ferrous sulfate 325 (65 FE) MG tablet Take 325 mg by mouth daily.  . furosemide (LASIX) 20 MG tablet Take 1 tablet (20 mg total) by mouth daily.  . insulin aspart (NOVOLOG) 100 UNIT/ML injection Use as directed  . levothyroxine (SYNTHROID, LEVOTHROID) 25 MCG tablet Take 25 mcg by mouth daily.  Marland Kitchen lisinopril (PRINIVIL,ZESTRIL) 20 MG tablet Take 20 mg by mouth daily.  . Multiple Vitamin (MULTIVITAMIN) tablet Take 1 tablet by mouth daily.  . mupirocin ointment (BACTROBAN) 2 %  Apply to affected area bid  . naproxen (NAPROSYN) 250 MG tablet Take by mouth as needed.  . pantoprazole (PROTONIX) 40 MG tablet TAKE 1 TABLET (40 MG TOTAL) BY MOUTH DAILY.  . pravastatin (PRAVACHOL) 40 MG tablet Take 1 tablet (40 mg total) by mouth at bedtime. Will need office visit for any more refills  . thiamine (VITAMIN B-1) 100 MG tablet Take 1 tablet (100 mg total) by mouth daily.  . traMADol (ULTRAM) 50 MG tablet Take 50 mg by mouth every 6 (six) hours as needed.  . [DISCONTINUED] FLUoxetine (PROZAC) 40 MG capsule Take 40 mg by mouth daily.  . [DISCONTINUED] fluticasone (FLOVENT HFA) 110 MCG/ACT inhaler Inhale 2 puffs into the lungs 2 (two) times daily. (Patient not taking: Reported on 01/12/2017)  . [DISCONTINUED] lisinopril (PRINIVIL,ZESTRIL) 20 MG tablet TAKE 1 TABLET BY MOUTH AT BEDTIME (Patient not taking: Reported on 01/12/2017)  . [DISCONTINUED] methimazole (TAPAZOLE) 10 MG tablet Take 20 mg by mouth 2 (two) times daily.  . [DISCONTINUED] metoCLOPramide (REGLAN) 10 MG tablet TAKE 1 TABLET (10 MG TOTAL) BY MOUTH AT BEDTIME. (Patient not taking: Reported on 01/12/2017)  . [DISCONTINUED] mupirocin ointment (BACTROBAN) 2 % Apply to affected area bid (Patient not taking: Reported on 01/12/2017)  . [DISCONTINUED] pantoprazole (PROTONIX) 40 MG tablet TAKE 1 TABLET (40 MG TOTAL) BY MOUTH DAILY. (Patient not taking: Reported on 01/12/2017)  . [DISCONTINUED] pravastatin (PRAVACHOL) 40 MG tablet TAKE 1 TABLET BY MOUTH AT BEDTIME (Patient not taking: Reported on 01/12/2017)  . [DISCONTINUED] sucralfate (CARAFATE) 1 G tablet TAKE 1 TABLET BY MOUTH 4 TIMES A DAY BEFORE MEALS AND AT BEDTIME FOR 30 DAYS (Patient not taking: Reported on 10/22/2016)  . [DISCONTINUED] traZODone (DESYREL) 50 MG tablet Take 50 mg by mouth daily as needed.   Facility-Administered Encounter Medications as of 10/22/2017  Medication  . cyanocobalamin ((VITAMIN B-12)) injection 1,000 mcg    Activities of Daily Living In your present  state of health, do you have any difficulty performing the following activities: 10/22/2017 01/12/2017  Hearing? N N  Vision? N N  Difficulty concentrating or making decisions? N N  Walking or climbing stairs? Y Y  Comment Unsteady gait -  Dressing or bathing? Tempie Donning  Comment wife assists with reaching -  Doing errands, shopping? Y Y  Comment he does not currently drive; wife assists -  Conservation officer, nature and eating ? N -  Using the Toilet? N -  In the past six months, have you accidently leaked urine? Y -  Do you have problems with loss of bowel control? N -  Managing your Medications? Y -  Comment wife assists -  Managing your Finances? Y -  Comment wife manages -  Housekeeping or managing your Housekeeping? Y -  Comment wife manages -  Some recent data might be hidden    Patient Care Team: [name], MD as PCP - General (Internal Medicine)   Assessment:   This is a routine wellness examination for Zoey. The goal of the wellness visit is to assist the patient how to close the gaps in care and create a preventative care plan for the patient.   The roster of all physicians providing medical care to patient is listed in the Snapshot section of the chart.  Osteoporosis risk reviewed.    Safety issues reviewed; Smoke and carbon monoxide detectors in the home. No firearms in the home.  Wears seatbelts when driving or riding with others. Patient does wear sunscreen or protective clothing when in direct sunlight. No violence in the home.  Patient is alert, normal appearance, oriented to person/place/and time. Correctly identified the president of the [place], recall of 3/3 words, and performing simple calculations. Displays appropriate judgement and can read correct time from watch face.   No new identified risk were noted.  No failures at ADL's or IADL's.  Ambulates with cane.   BMI- discussed the importance of a healthy diet, water intake and the benefits of aerobic exercise.  Educational material provided.   24 hour diet recall: Regular diet, does not monitor  Eye- Visual acuity not assessed per patient preference  Sleeps 7 hours at night.    Pneumovax 23 administered L deltoid, tolerated well. Educational material provided.  Hepatitis C screening completed.  Educational material provided.  Patient Concerns: None at this time. Follow up with PCP as needed.  Exercise Activities and Dietary recommendations Current Exercise Habits: The patient does not participate in regular exercise at present  Goals    . Healthy Lifestyle     Low carb foods Stay hydrated Exercise (walking as tolerated/chair exercises as demonstrated)       Fall Risk Fall Risk  10/22/2017 10/21/2017 09/28/2017 09/16/2017 10/22/2016  Falls in the past year? Yes Yes Yes Yes Yes  Comment - - - - -  Number falls in past yr: 2 or more 1 1 1 1   Injury with Fall? Yes Yes Yes Yes No  Risk Factor Category  High Fall Risk - High Fall Risk High Fall Risk -  Risk for fall due to : History of fall(s) - - - -  Follow up Education provided - Education provided Education provided;Falls prevention discussed Falls prevention discussed;Education provided   Depression Screen PHQ 2/9 Scores 10/22/2017 10/21/2017 10/22/2016 08/09/2015  PHQ - 2 Score 0 0 0 0  PHQ- 9 Score - - - -    Cognitive Function MMSE - Mini Mental State Exam 10/22/2016  Orientation to time 5  Orientation to Place 5  Registration 3  Attention/ Calculation 5  Recall 3  Language- name 2 objects 2  Language- repeat 1  Language- follow 3 step command 3  Language- read & follow direction 1  Write a sentence 1  Copy design 1  Total score 30     6CIT Screen 10/22/2017  What Year? 0 points  What month? 0 points  What time? 0 points  Count back from 20 0 points  Months in reverse 0 points  Repeat phrase 0 points  Total Score 0    Immunization History  Administered Date(s) Administered  . Influenza Split 07/13/2014  .  Influenza, High Dose Seasonal PF  10/22/2016, 10/21/2017  . Influenza,inj,Quad PF,6+ Mos 06/20/2013, 08/09/2015  . Pneumococcal Conjugate-13 12/26/2014  . Pneumococcal Polysaccharide-23 10/22/2017   Screening Tests Health Maintenance  Topic Date Due  . Hepatitis C Screening  1946-04-10  . FOOT EXAM  07/07/1958  . TETANUS/TDAP  07/08/1967  . OPHTHALMOLOGY EXAM  05/23/2015  . PNA vac Low Risk Adult (2 of 2 - PPSV23) 12/26/2015  . HEMOGLOBIN A1C  11/18/2017  . COLONOSCOPY  01/09/2024  . INFLUENZA VACCINE  Completed      Plan:    End of life planning; Advanced aging; Advanced directives discussed.  No HCPOA/Living Will.  Additional information declined at this time.  I have personally reviewed and noted the following in the patient's chart:   . Medical and social history . Use of alcohol, tobacco or illicit drugs  . Current medications and supplements . Functional ability and status . Nutritional status . Physical activity . Advanced directives . List of other physicians . Hospitalizations, surgeries, and ER visits in previous 12 months . Vitals . Screenings to include cognitive, depression, and falls . Referrals and appointments  In addition, I have reviewed and discussed with patient certain preventive protocols, quality metrics, and best practice recommendations. A written personalized care plan for preventive services as well as general preventive health recommendations were provided to patient.     Varney Biles, LPN  [id number]   Reviewed above information.  Agree with assessment and plan.    [name]

## 2017-10-23 LAB — HEPATITIS C ANTIBODY
Hepatitis C Ab: NONREACTIVE
SIGNAL TO CUT-OFF: 0.06 (ref <1.00)

## 2017-10-24 ENCOUNTER — Encounter: Payer: Self-pay | Admitting: Internal Medicine

## 2017-10-24 DIAGNOSIS — Z7189 Other specified counseling: Secondary | ICD-10-CM | POA: Insufficient documentation

## 2017-10-24 MED ORDER — ACYCLOVIR 400 MG PO TABS: 400.0000 mg | ORAL_TABLET | Freq: Every day | ORAL | 3 refills | Status: DC

## 2017-10-24 MED ORDER — PROCHLORPERAZINE MALEATE 10 MG PO TABS
10.0000 mg | ORAL_TABLET | Freq: Four times a day (QID) | ORAL | 2 refills | Status: DC | PRN
Start: 2017-10-24 — End: 2017-12-23

## 2017-10-24 MED ORDER — ONDANSETRON HCL 8 MG PO TABS: 8.0000 mg | ORAL_TABLET | Freq: Two times a day (BID) | ORAL | 2 refills | Status: DC | PRN

## 2017-10-24 NOTE — Assessment & Plan Note (Signed)
Seeing [facility].  Planning for lymphedema pump.

## 2017-10-24 NOTE — Progress Notes (Signed)
START ON PATHWAY REGIMEN - Lymphoma and CLL     A cycle is every 28 days:     Bendamustine      Rituximab   **Always confirm dose/schedule in your pharmacy ordering system**    Patient Characteristics: Mantle Cell Lymphoma, First Line, Stage II - IV, Transplant Ineligible Disease Type: Not Applicable Disease Type: Mantle Cell Lymphoma Disease Type: Not Applicable Line of Therapy: First Line [place] Stage: III Eligible for Transplant<= Transplant Ineligible Intent of Therapy: Non-Curative / Palliative Intent, Discussed with Patient

## 2017-10-24 NOTE — Assessment & Plan Note (Signed)
Blood pressure under good control.  Continue same medication regimen.  Follow pressures.  Follow metabolic panel.   

## 2017-10-24 NOTE — Assessment & Plan Note (Signed)
Stable

## 2017-10-24 NOTE — Assessment & Plan Note (Signed)
Has been seeing ortho.  States healing now.

## 2017-10-24 NOTE — Progress Notes (Signed)
Patient on plan of care prior to pathways. 

## 2017-10-24 NOTE — Assessment & Plan Note (Signed)
Stress is better. Son is in jail.  Follow.

## 2017-10-24 NOTE — Assessment & Plan Note (Signed)
No upper symptoms reported.   

## 2017-10-24 NOTE — Assessment & Plan Note (Signed)
Followed by oncology.  Just had CT.  Plans to f/u with oncology today to discuss CT results and plans for further treatment.

## 2017-10-24 NOTE — Assessment & Plan Note (Signed)
Pravastatin.  Low cholesterol diet and exercise.  Follow lipid panel and liver function tests.   

## 2017-10-24 NOTE — Assessment & Plan Note (Signed)
Seeing [facility].  Unna boots in place. Planning for lymphedema pump.

## 2017-10-24 NOTE — Assessment & Plan Note (Signed)
Followed by Dr Eddie Dibbles.

## 2017-10-24 NOTE — Assessment & Plan Note (Signed)
Continue B12 injections.  CBC being followed by hematology.

## 2017-10-24 NOTE — Assessment & Plan Note (Signed)
Followed by Dr Nehemiah Massed.  Continue risk factor modification.

## 2017-10-24 NOTE — Assessment & Plan Note (Signed)
Low carb diet and exercise.  No low sugars.  Followed by Dr Eddie Dibbles.

## 2017-10-25 NOTE — Patient Instructions (Signed)
Rituximab injection What is this medicine? RITUXIMAB (ri TUX i mab) is a monoclonal antibody. It is used to treat certain types of cancer like non-Hodgkin lymphoma and chronic lymphocytic leukemia. It is also used to treat rheumatoid arthritis, granulomatosis with polyangiitis (or Wegener's granulomatosis), and microscopic polyangiitis. This medicine may be used for other purposes; ask your health care provider or pharmacist if you have questions. COMMON BRAND NAME(S): Rituxan What should I tell my health care provider before I take this medicine? They need to know if you have any of these conditions: -heart disease -infection (especially a virus infection such as hepatitis B, chickenpox, cold sores, or herpes) -immune system problems -irregular heartbeat -kidney disease -lung or breathing disease, like asthma -recently received or scheduled to receive a vaccine -an unusual or allergic reaction to rituximab, mouse proteins, other medicines, foods, dyes, or preservatives -pregnant or trying to get pregnant -breast-feeding How should I use this medicine? This medicine is for infusion into a vein. It is administered in a hospital or clinic by a specially trained health care professional. A special MedGuide will be given to you by the pharmacist with each prescription and refill. Be sure to read this information carefully each time. Talk to your pediatrician regarding the use of this medicine in children. This medicine is not approved for use in children. Overdosage: If you think you have taken too much of this medicine contact a poison control center or emergency room at once. NOTE: This medicine is only for you. Do not share this medicine with others. What if I miss a dose? It is important not to miss a dose. Call your doctor or health care professional if you are unable to keep an appointment. What may interact with this medicine? -cisplatin -other medicines for arthritis like disease  modifying antirheumatic drugs or tumor necrosis factor inhibitors -live virus vaccines This list may not describe all possible interactions. Give your health care provider a list of all the medicines, herbs, non-prescription drugs, or dietary supplements you use. Also tell them if you smoke, drink alcohol, or use illegal drugs. Some items may interact with your medicine. What should I watch for while using this medicine? Your condition will be monitored carefully while you are receiving this medicine. You may need blood work done while you are taking this medicine. This medicine can cause serious allergic reactions. To reduce your risk you may need to take medicine before treatment with this medicine. Take your medicine as directed. In some patients, this medicine may cause a serious brain infection that may cause death. If you have any problems seeing, thinking, speaking, walking, or standing, tell your doctor right away. If you cannot reach your doctor, urgently seek other source of medical care. Call your doctor or health care professional for advice if you get a fever, chills or sore throat, or other symptoms of a cold or flu. Do not treat yourself. This drug decreases your body's ability to fight infections. Try to avoid being around people who are sick. Do not become pregnant while taking this medicine or for 12 months after stopping it. Women should inform their doctor if they wish to become pregnant or think they might be pregnant. There is a potential for serious side effects to an unborn child. Talk to your health care professional or pharmacist for more information. What side effects may I notice from receiving this medicine? Side effects that you should report to your doctor or health care professional as soon as possible: -breathing   problems -chest pain -dizziness or feeling faint -fast, irregular heartbeat -low blood counts - this medicine may decrease the number of white blood cells,  red blood cells and platelets. You may be at increased risk for infections and bleeding. -mouth sores -redness, blistering, peeling or loosening of the skin, including inside the mouth (this can be added for any serious or exfoliative rash that could lead to hospitalization) -signs of infection - fever or chills, cough, sore throat, pain or difficulty passing urine -signs and symptoms of kidney injury like trouble passing urine or change in the amount of urine -signs and symptoms of liver injury like dark yellow or brown urine; general ill feeling or flu-like symptoms; light-colored stools; loss of appetite; nausea; right upper belly pain; unusually weak or tired; yellowing of the eyes or skin -stomach pain -vomiting Side effects that usually do not require medical attention (report to your doctor or health care professional if they continue or are bothersome): -headache -joint pain -muscle cramps or muscle pain This list may not describe all possible side effects. Call your doctor for medical advice about side effects. You may report side effects to FDA at 1-800-FDA-1088. Where should I keep my medicine? This drug is given in a hospital or clinic and will not be stored at home. NOTE: This sheet is a summary. It may not cover all possible information. If you have questions about this medicine, talk to your doctor, pharmacist, or health care provider.  2018 Elsevier/Gold Standard (2016-05-06 15:28:09) Bendamustine Injection What is this medicine? BENDAMUSTINE (BEN da MUS teen) is a chemotherapy drug. It is used to treat chronic lymphocytic leukemia and non-Hodgkin lymphoma. This medicine may be used for other purposes; ask your health care provider or pharmacist if you have questions. COMMON BRAND NAME(S): BENDEKA, Treanda What should I tell my health care provider before I take this medicine? They need to know if you have any of these conditions: -infection (especially a virus infection such  as chickenpox, cold sores, or herpes) -kidney disease -liver disease -an unusual or allergic reaction to bendamustine, mannitol, other medicines, foods, dyes, or preservatives -pregnant or trying to get pregnant -breast-feeding How should I use this medicine? This medicine is for infusion into a vein. It is given by a health care professional in a hospital or clinic setting. Talk to your pediatrician regarding the use of this medicine in children. Special care may be needed. Overdosage: If you think you have taken too much of this medicine contact a poison control center or emergency room at once. NOTE: This medicine is only for you. Do not share this medicine with others. What if I miss a dose? It is important not to miss your dose. Call your doctor or health care professional if you are unable to keep an appointment. What may interact with this medicine? Do not take this medicine with any of the following medications: -clozapine This medicine may also interact with the following medications: -atazanavir -cimetidine -ciprofloxacin -enoxacin -fluvoxamine -medicines for seizures like carbamazepine and phenobarbital -mexiletine -rifampin -tacrine -thiabendazole -zileuton This list may not describe all possible interactions. Give your health care provider a list of all the medicines, herbs, non-prescription drugs, or dietary supplements you use. Also tell them if you smoke, drink alcohol, or use illegal drugs. Some items may interact with your medicine. What should I watch for while using this medicine? This drug may make you feel generally unwell. This is not uncommon, as chemotherapy can affect healthy cells as well as cancer   cells. Report any side effects. Continue your course of treatment even though you feel ill unless your doctor tells you to stop. You may need blood work done while you are taking this medicine. Call your doctor or health care professional for advice if you get a  fever, chills or sore throat, or other symptoms of a cold or flu. Do not treat yourself. This drug decreases your body's ability to fight infections. Try to avoid being around people who are sick. This medicine may increase your risk to bruise or bleed. Call your doctor or health care professional if you notice any unusual bleeding. Talk to your doctor about your risk of cancer. You may be more at risk for certain types of cancers if you take this medicine. Do not become pregnant while taking this medicine or for 3 months after stopping it. Women should inform their doctor if they wish to become pregnant or think they might be pregnant. Men should not father a child while taking this medicine and for 3 months after stopping it.There is a potential for serious side effects to an unborn child. Talk to your health care professional or pharmacist for more information. Do not breast-feed an infant while taking this medicine. This medicine may interfere with the ability to have a child. You should talk with your doctor or health care professional if you are concerned about your fertility. What side effects may I notice from receiving this medicine? Side effects that you should report to your doctor or health care professional as soon as possible: -allergic reactions like skin rash, itching or hives, swelling of the face, lips, or tongue -low blood counts - this medicine may decrease the number of white blood cells, red blood cells and platelets. You may be at increased risk for infections and bleeding. -redness, blistering, peeling or loosening of the skin, including inside the mouth -signs of infection - fever or chills, cough, sore throat, pain or difficulty passing urine -signs of decreased platelets or bleeding - bruising, pinpoint red spots on the skin, black, tarry stools, blood in the urine -signs of decreased red blood cells - unusually weak or tired, fainting spells, lightheadedness -signs and  symptoms of kidney injury like trouble passing urine or change in the amount of urine -signs and symptoms of liver injury like dark yellow or brown urine; general ill feeling or flu-like symptoms; light-colored stools; loss of appetite; nausea; right upper belly pain; unusually weak or tired; yellowing of the eyes or skin Side effects that usually do not require medical attention (report to your doctor or health care professional if they continue or are bothersome): -constipation -decreased appetite -diarrhea -headache -mouth sores -nausea/vomiting -tiredness This list may not describe all possible side effects. Call your doctor for medical advice about side effects. You may report side effects to FDA at 1-800-FDA-1088. Where should I keep my medicine? This drug is given in a hospital or clinic and will not be stored at home. NOTE: This sheet is a summary. It may not cover all possible information. If you have questions about this medicine, talk to your doctor, pharmacist, or health care provider.  2018 Elsevier/Gold Standard (2015-08-01 08:45:41)  

## 2017-10-26 ENCOUNTER — Inpatient Hospital Stay: Payer: Medicare Other

## 2017-10-27 ENCOUNTER — Encounter (INDEPENDENT_AMBULATORY_CARE_PROVIDER_SITE_OTHER): Payer: Self-pay | Admitting: Vascular Surgery

## 2017-10-27 ENCOUNTER — Ambulatory Visit (INDEPENDENT_AMBULATORY_CARE_PROVIDER_SITE_OTHER): Payer: Medicare Other | Admitting: Vascular Surgery

## 2017-10-27 VITALS — BP 124/72 | HR 93 | Resp 16 | Wt 260.0 lb

## 2017-10-27 DIAGNOSIS — I890 Lymphedema, not elsewhere classified: Secondary | ICD-10-CM | POA: Diagnosis not present

## 2017-10-27 NOTE — Progress Notes (Signed)
History of Present Illness  There is no documented history at this time  Assessments & Plan   There are no diagnoses linked to this encounter.    Additional instructions  Subjective:  Patient presents with venous ulcer of the Bilateral lower extremity.    Procedure:  3 layer unna wrap was placed Bilateral lower extremity.   Plan:   Follow up in one week.  

## 2017-10-28 ENCOUNTER — Other Ambulatory Visit: Payer: Self-pay

## 2017-10-28 ENCOUNTER — Encounter: Payer: Self-pay | Admitting: Emergency Medicine

## 2017-10-28 ENCOUNTER — Observation Stay
Admission: EM | Admit: 2017-10-28 | Discharge: 2017-10-29 | Disposition: A | Discharge status: Home / Self Care | Payer: Medicare Other | Attending: Internal Medicine | Admitting: Internal Medicine

## 2017-10-28 ENCOUNTER — Emergency Department: Payer: Medicare Other

## 2017-10-28 DIAGNOSIS — E11319 Type 2 diabetes mellitus with unspecified diabetic retinopathy without macular edema: Secondary | ICD-10-CM | POA: Insufficient documentation

## 2017-10-28 DIAGNOSIS — E0500 Thyrotoxicosis with diffuse goiter without thyrotoxic crisis or storm: Secondary | ICD-10-CM | POA: Insufficient documentation

## 2017-10-28 DIAGNOSIS — E039 Hypothyroidism, unspecified: Secondary | ICD-10-CM | POA: Insufficient documentation

## 2017-10-28 DIAGNOSIS — E162 Hypoglycemia, unspecified: Secondary | ICD-10-CM | POA: Diagnosis present

## 2017-10-28 DIAGNOSIS — Z794 Long term (current) use of insulin: Secondary | ICD-10-CM | POA: Insufficient documentation

## 2017-10-28 DIAGNOSIS — E538 Deficiency of other specified B group vitamins: Secondary | ICD-10-CM | POA: Insufficient documentation

## 2017-10-28 DIAGNOSIS — E11649 Type 2 diabetes mellitus with hypoglycemia without coma: Principal | ICD-10-CM | POA: Insufficient documentation

## 2017-10-28 DIAGNOSIS — Z79899 Other long term (current) drug therapy: Secondary | ICD-10-CM | POA: Insufficient documentation

## 2017-10-28 DIAGNOSIS — F17220 Nicotine dependence, chewing tobacco, uncomplicated: Secondary | ICD-10-CM | POA: Diagnosis not present

## 2017-10-28 DIAGNOSIS — K219 Gastro-esophageal reflux disease without esophagitis: Secondary | ICD-10-CM | POA: Diagnosis not present

## 2017-10-28 DIAGNOSIS — F329 Major depressive disorder, single episode, unspecified: Secondary | ICD-10-CM | POA: Insufficient documentation

## 2017-10-28 DIAGNOSIS — I2510 Atherosclerotic heart disease of native coronary artery without angina pectoris: Secondary | ICD-10-CM | POA: Insufficient documentation

## 2017-10-28 DIAGNOSIS — S0990XA Unspecified injury of head, initial encounter: Secondary | ICD-10-CM | POA: Diagnosis not present

## 2017-10-28 DIAGNOSIS — I10 Essential (primary) hypertension: Secondary | ICD-10-CM | POA: Diagnosis not present

## 2017-10-28 DIAGNOSIS — E1142 Type 2 diabetes mellitus with diabetic polyneuropathy: Secondary | ICD-10-CM | POA: Diagnosis not present

## 2017-10-28 DIAGNOSIS — R531 Weakness: Secondary | ICD-10-CM | POA: Diagnosis not present

## 2017-10-28 DIAGNOSIS — E785 Hyperlipidemia, unspecified: Secondary | ICD-10-CM | POA: Diagnosis not present

## 2017-10-28 DIAGNOSIS — K3184 Gastroparesis: Secondary | ICD-10-CM | POA: Diagnosis not present

## 2017-10-28 DIAGNOSIS — W19XXXA Unspecified fall, initial encounter: Secondary | ICD-10-CM

## 2017-10-28 DIAGNOSIS — E7800 Pure hypercholesterolemia, unspecified: Secondary | ICD-10-CM | POA: Diagnosis not present

## 2017-10-28 DIAGNOSIS — S199XXA Unspecified injury of neck, initial encounter: Secondary | ICD-10-CM | POA: Diagnosis not present

## 2017-10-28 LAB — URINALYSIS, COMPLETE (UACMP) WITH MICROSCOPIC
BACTERIA UA: NONE SEEN
Bilirubin Urine: NEGATIVE
Glucose, UA: >=500 mg/dL — AB
Hgb urine dipstick: NEGATIVE
KETONES UR: 5 mg/dL — AB
Leukocytes, UA: NEGATIVE
Nitrite: NEGATIVE
PROTEIN: NEGATIVE mg/dL
Specific Gravity, Urine: 1.025 (ref 1.005–1.030)
pH: 5 (ref 5.0–8.0)

## 2017-10-28 LAB — GLUCOSE, CAPILLARY
GLUCOSE-CAPILLARY: 42 mg/dL — AB (ref 65–99)
GLUCOSE-CAPILLARY: 71 mg/dL (ref 65–99)
GLUCOSE-CAPILLARY: 105 mg/dL — AB (ref 65–99)
GLUCOSE-CAPILLARY: 170 mg/dL — AB (ref 65–99)
GLUCOSE-CAPILLARY: 217 mg/dL — AB (ref 65–99)
Glucose-Capillary: 44 mg/dL — CL (ref 65–99)
Glucose-Capillary: 264 mg/dL — ABNORMAL HIGH (ref 65–99)
Glucose-Capillary: 402 mg/dL — ABNORMAL HIGH (ref 65–99)

## 2017-10-28 LAB — COMPREHENSIVE METABOLIC PANEL
ALBUMIN: 3.2 g/dL — AB (ref 3.5–5.0)
ALK PHOS: 230 U/L — AB (ref 38–126)
ALT: 16 U/L — ABNORMAL LOW (ref 17–63)
ANION GAP: 9 (ref 5–15)
AST: 32 U/L (ref 15–41)
BILIRUBIN TOTAL: 1 mg/dL (ref 0.3–1.2)
BUN: 14 mg/dL (ref 6–20)
CHLORIDE: 104 mmol/L (ref 101–111)
CO2: 28 mmol/L (ref 22–32)
CREATININE: 1.03 mg/dL (ref 0.61–1.24)
Calcium: 8.4 mg/dL — ABNORMAL LOW (ref 8.9–10.3)
GFR calc non Af Amer: >60 mL/min (ref >60)
Glucose, Bld: 56 mg/dL — ABNORMAL LOW (ref 65–99)
Potassium: 3.9 mmol/L (ref 3.5–5.1)
Sodium: 141 mmol/L (ref 135–145)
Total Protein: 6.5 g/dL (ref 6.5–8.1)

## 2017-10-28 LAB — CBC WITH DIFFERENTIAL/PLATELET
BASOS ABS: 0.1 10*3/uL (ref 0–0.1)
BASOS PCT: 1 %
Eosinophils Absolute: 0.3 10*3/uL (ref 0–0.7)
Eosinophils Relative: 4 %
HEMATOCRIT: 39 % — AB (ref 40.0–52.0)
HEMOGLOBIN: 12.6 g/dL — AB (ref 13.0–18.0)
Lymphocytes Relative: 10 %
Lymphs Abs: 0.6 10*3/uL — ABNORMAL LOW (ref 1.0–3.6)
MCH: 28.2 pg (ref 26.0–34.0)
MCHC: 32.4 g/dL (ref 32.0–36.0)
MCV: 87 fL (ref 80.0–100.0)
Monocytes Absolute: 0.7 10*3/uL (ref 0.2–1.0)
Monocytes Relative: 11 %
NEUTROS ABS: 4.6 10*3/uL (ref 1.4–6.5)
NEUTROS PCT: 74 %
Platelets: 167 10*3/uL (ref 150–440)
RBC: 4.48 MIL/uL (ref 4.40–5.90)
RDW: 22 % — ABNORMAL HIGH (ref 11.5–14.5)
WBC: 6.3 10*3/uL (ref 3.8–10.6)

## 2017-10-28 LAB — HEMOGLOBIN A1C
HEMOGLOBIN A1C: 5.6 % (ref 4.8–5.6)
Mean Plasma Glucose: 114.02 mg/dL

## 2017-10-28 LAB — TSH: TSH: 3.894 u[IU]/mL (ref 0.350–4.500)

## 2017-10-28 MED ORDER — DEXTROSE 50 % IV SOLN
INTRAVENOUS | Status: AC
  Administered 2017-10-28: 50 mL via INTRAVENOUS
  Filled 2017-10-28: qty 50

## 2017-10-28 MED ORDER — LEVOTHYROXINE SODIUM 25 MCG PO TABS
25.0000 ug | ORAL_TABLET | Freq: Every day | ORAL | Status: DC
  Administered 2017-10-29: 09:00:00 25 ug via ORAL
  Filled 2017-10-28: qty 1

## 2017-10-28 MED ORDER — ONE-DAILY MULTI VITAMINS PO TABS
1.0000 | ORAL_TABLET | Freq: Every day | ORAL | Status: DC
  Administered 2017-10-28 – 2017-10-29 (×2): 1 via ORAL
  Filled 2017-10-28 (×2): qty 1

## 2017-10-28 MED ORDER — KCL IN DEXTROSE-NACL 20-5-0.45 MEQ/L-%-% IV SOLN
Freq: Once | INTRAVENOUS | Status: AC
  Administered 2017-10-28: 13:00:00 via INTRAVENOUS
  Filled 2017-10-28: qty 1000

## 2017-10-28 MED ORDER — LEVOTHYROXINE SODIUM 25 MCG PO TABS: 25.0000 ug | ORAL_TABLET | Freq: Every day | ORAL | Status: DC

## 2017-10-28 MED ORDER — TRAMADOL HCL 50 MG PO TABS: 50.0000 mg | ORAL_TABLET | Freq: Four times a day (QID) | ORAL | Status: DC | PRN

## 2017-10-28 MED ORDER — CYANOCOBALAMIN 1000 MCG/ML IJ SOLN
1000.0000 ug | Freq: Once | INTRAMUSCULAR | Status: DC
  Filled 2017-10-28: qty 1

## 2017-10-28 MED ORDER — ACYCLOVIR 400 MG PO TABS
400.0000 mg | ORAL_TABLET | Freq: Every day | ORAL | Status: DC
  Filled 2017-10-28: qty 1

## 2017-10-28 MED ORDER — DEXTROSE 50 % IV SOLN
1.0000 | Freq: Once | INTRAVENOUS | Status: AC
  Administered 2017-10-28: 50 mL via INTRAVENOUS

## 2017-10-28 MED ORDER — ONDANSETRON HCL 4 MG PO TABS: 8.0000 mg | ORAL_TABLET | Freq: Two times a day (BID) | ORAL | Status: DC | PRN

## 2017-10-28 MED ORDER — PANTOPRAZOLE SODIUM 40 MG PO TBEC
40.0000 mg | DELAYED_RELEASE_TABLET | Freq: Every day | ORAL | Status: DC
  Administered 2017-10-28 – 2017-10-29 (×2): 40 mg via ORAL
  Filled 2017-10-28 (×2): qty 1

## 2017-10-28 MED ORDER — CARVEDILOL 3.125 MG PO TABS
3.1250 mg | ORAL_TABLET | Freq: Two times a day (BID) | ORAL | Status: DC
  Administered 2017-10-28 – 2017-10-29 (×2): 3.125 mg via ORAL
  Filled 2017-10-28 (×2): qty 1

## 2017-10-28 MED ORDER — DEXTROSE 50 % IV SOLN
1.0000 | Freq: Once | INTRAVENOUS | Status: AC
Start: 2017-10-28 — End: 2017-10-28
  Administered 2017-10-28: 50 mL via INTRAVENOUS

## 2017-10-28 MED ORDER — ASPIRIN 81 MG PO CHEW
324.0000 mg | CHEWABLE_TABLET | Freq: Once | ORAL | Status: AC
  Administered 2017-10-28: 324 mg via ORAL
  Filled 2017-10-28: qty 4

## 2017-10-28 MED ORDER — FERROUS SULFATE 325 (65 FE) MG PO TABS
325.0000 mg | ORAL_TABLET | Freq: Every day | ORAL | Status: DC
  Administered 2017-10-28 – 2017-10-29 (×2): 325 mg via ORAL
  Filled 2017-10-28 (×2): qty 1

## 2017-10-28 MED ORDER — HEPARIN SODIUM (PORCINE) 5000 UNIT/ML IJ SOLN
5000.0000 [IU] | Freq: Three times a day (TID) | INTRAMUSCULAR | Status: DC
  Administered 2017-10-28 – 2017-10-29 (×2): 5000 [IU] via SUBCUTANEOUS
  Filled 2017-10-28 (×2): qty 1

## 2017-10-28 MED ORDER — PRAVASTATIN SODIUM 20 MG PO TABS
40.0000 mg | ORAL_TABLET | Freq: Every day | ORAL | Status: DC
  Administered 2017-10-28: 40 mg via ORAL
  Filled 2017-10-28: qty 2

## 2017-10-28 MED ORDER — INSULIN ASPART 100 UNIT/ML ~~LOC~~ SOLN: 0.0000 [IU] | Freq: Three times a day (TID) | SUBCUTANEOUS | Status: DC

## 2017-10-28 MED ORDER — INSULIN ASPART 100 UNIT/ML ~~LOC~~ SOLN
0.0000 [IU] | Freq: Every day | SUBCUTANEOUS | Status: DC
  Administered 2017-10-28: 5 [IU] via SUBCUTANEOUS
  Filled 2017-10-28: qty 1

## 2017-10-28 MED ORDER — ASPIRIN EC 325 MG PO TBEC: 325.0000 mg | DELAYED_RELEASE_TABLET | Freq: Four times a day (QID) | ORAL | Status: DC | PRN

## 2017-10-28 MED ORDER — VITAMIN B-1 100 MG PO TABS
100.0000 mg | ORAL_TABLET | Freq: Every day | ORAL | Status: DC
  Administered 2017-10-28 – 2017-10-29 (×2): 100 mg via ORAL
  Filled 2017-10-28 (×2): qty 1

## 2017-10-28 MED ORDER — LISINOPRIL 20 MG PO TABS
20.0000 mg | ORAL_TABLET | Freq: Every day | ORAL | Status: DC
  Administered 2017-10-28 – 2017-10-29 (×2): 20 mg via ORAL
  Filled 2017-10-28 (×2): qty 1

## 2017-10-28 MED ORDER — DEXTROSE-NACL 5-0.45 % IV SOLN: INTRAVENOUS | Status: DC

## 2017-10-28 MED ORDER — PROCHLORPERAZINE MALEATE 10 MG PO TABS
10.0000 mg | ORAL_TABLET | Freq: Four times a day (QID) | ORAL | Status: DC | PRN
  Filled 2017-10-28: qty 1

## 2017-10-28 MED ORDER — ACYCLOVIR 200 MG PO CAPS
400.0000 mg | ORAL_CAPSULE | Freq: Every day | ORAL | Status: DC
  Administered 2017-10-28 – 2017-10-29 (×2): 400 mg via ORAL
  Filled 2017-10-28 (×2): qty 2

## 2017-10-28 MED ORDER — INSULIN ASPART 100 UNIT/ML ~~LOC~~ SOLN
4.0000 [IU] | Freq: Once | SUBCUTANEOUS | Status: AC
  Administered 2017-10-28: 22:00:00 4 [IU] via SUBCUTANEOUS
  Filled 2017-10-28: qty 1

## 2017-10-28 NOTE — ED Notes (Signed)
Patient transported to CT 

## 2017-10-28 NOTE — Progress Notes (Signed)
Inpatient Diabetes Program Recommendations  AACE/ADA: New Consensus Statement on Inpatient Glycemic Control (2015)  Target Ranges:  Prepandial:   less than 140 mg/dL      Peak postprandial:   less than 180 mg/dL (1-2 hours)      Critically ill patients:  140 - 180 mg/dL   Lab Results  Component Value Date   GLUCAP 105 (H) 10/28/2017   HGBA1C 7.6 05/18/2017    Review of Glycemic Control  Results for Gary [name], Gary [name] (MRN [id number]) as of 10/28/2017 15:23  Ref. Range 10/28/2017 12:16 10/28/2017 12:19 10/28/2017 12:52 10/28/2017 13:54 10/28/2017 15:09  Glucose-Capillary Latest Ref Range: 65 - 99 mg/dL 71  44 (LL) 105 (H) 170 (H)    History: DM Type 1 (diagnosed at age 63), Mantle Cell Lymphoma  Home DM Meds: Insulin Pump (see below for settings)  Current Insulin Orders: none   Has History of Type 1 DM diagnosed at age 30.  -Last saw Dr. Eddie Dibbles with [facility] on 08/30/17 .  At that visit, no changes were made to pt's insulin pump settings.  They are as follows: Insulin Pump Settings Basal Rates: 12am- 0.8 units/hr                         9am- 0.9 units/hr Total Basal Insulin per 24 hours period= 20.7 units  Carbohydrate Ratio: 1 unit for every 8 Grams of Carbohydrates Correction/Sensitivity Factor: 1 unit for every 50 mg/dl above Target CBG Target CBG: 80-120 mg/dl She did note at that visit that he had been overbolusing **  I would recommend he not restart his insulin pump until he follows up with Dr. Eddie Dibbles.  There is a 1-800 number on the back of his pump- he should call that number and tell them of his recent hospital admission- they will complete a safety check of the pump with him. I have notified Dr. Eddie Dibbles of his admission.   -Will need basal insulin prevent pt from developing DKA - consider Lantus 17 units qhs (20% reduction from home dose)  Please consider adding Novolog 6 units tid with meals   Custom Novolog correction scale 0-5 units tid and hs      151-200   1 unit      201-250  2 units      251-300  3 units      301-350  4 units      351-400  5 units  Gentry Fitz, RN, [place], [place], CDE Diabetes Coordinator Inpatient Diabetes Program  [phone] (Team Pager) [phone] ([place]) 10/28/2017 3:41 PM

## 2017-10-28 NOTE — ED Triage Notes (Addendum)
Pt arrived via ems from home after falling and hitting head. Pt did lose consciousness. In route ems found a blood sugar of 32 and administered 1 amp of d50 and rechecked his blood sugar prior to arrival and got a result of 84. EMS disconnected and removed pt's insulin pump. In ED pt's blood sugar 42 and was given 1 amp of d50. Pt alert and oriented but has no recollection of the fall, pt only remembers waking up in the ambulance.

## 2017-10-28 NOTE — ED Notes (Signed)
Pt given [place] tray and orange juice after MD verification that patient could eat.

## 2017-10-28 NOTE — ED Notes (Signed)
Blood sugar reading of 44, MD made aware.

## 2017-10-28 NOTE — ED Provider Notes (Signed)
[facility] Emergency Department Provider Note  Time seen: 12:53 PM  I have reviewed the triage vital signs and the nursing notes.   HISTORY  Chief Complaint Fall and Hypoglycemia    HPI Gary [name] is a 72 y.o. male with a past medical history of diabetes, hypertension, hyperlipidemia, mantle cell lymphoma who presents to the emergency department after a fall.  According to the patient he became lightheaded, fell hitting his head.  Does not believe he lost consciousness but is not entirely sure.  EMS was called, the patient was found to have a blood glucose of 30 and was given an amp of D50 EMS notes the blood glucose increased to over 80.  Upon arrival to the emergency department the blood glucose is once again decreased to 40 and the patient feels somnolent and lightheaded.  Patient denies any fever, cough, congestion, abdominal pain, nausea vomiting diarrhea or dysuria.  Largely negative review of systems.  Patient states he remembers waking up this morning checking his blood glucose that was over 400 and dosing himself insulin via his insulin pump.  He is not sure how much insulin he dosed himself.  We disconnected his insulin pump upon arrival to the emergency department.  Past Medical History:  Diagnosis Date  . Depression   . 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 Gastroparesis   . Graves disease   . Hypercholesterolemia   . Hypertension   . Mantle cell lymphoma ([place])   . Peripheral neuropathy     Patient Active Problem List   Diagnosis Date Noted  . Goals of care, counseling/discussion 10/24/2017  . Lymphedema 10/20/2017  . Cellulitis 09/16/2017  . Bilateral lower extremity edema 09/16/2017  . Iron deficiency anemia 09/11/2017  . Fall 08/13/2017  . Humerus fracture 08/13/2017  . Anemia 01/12/2017  . Hand laceration 12/16/2015  . Adjustment disorder with depressed mood 08/11/2015  . CAD in  native artery 11/02/2014  . Swelling of left lower extremity 08/03/2014  . Mantle cell lymphoma ([place]) 08/03/2014  . History of colonic polyps 04/29/2014  . Irregular heart beat 04/22/2014  . SOB (shortness of breath) 04/22/2014  . Stress 03/21/2014  . GERD (gastroesophageal reflux disease) 10/23/2013  . Gastroparesis 06/25/2013  . Chest pain 04/13/2013  . Thyroid disease 04/13/2013  . Diabetes ([place]) 04/13/2013  . Essential hypertension, benign 04/13/2013  . Hypercholesterolemia 04/13/2013    Past Surgical History:  Procedure Laterality Date  . NEPHRECTOMY     right    Prior to Admission medications   Medication Sig Start Date End Date Taking? Authorizing Provider  ACCU-CHEK AVIVA PLUS test strip USE AS INSTRUCTED. USE 1 STRIP EVERY 3 HOURS . ACCU-CHECK AVIVA PLUS TEST STRIP. DX V25.366 01/06/17   [provider]  acyclovir (ZOVIRAX) 400 MG tablet Take 1 tablet (400 mg total) by mouth daily. 10/24/17   Lloyd Huger, MD  aspirin EC 325 MG tablet Take 325 mg by mouth every 6 (six) hours as needed for mild pain.     [provider]  carvedilol (COREG) 3.125 MG tablet Take by mouth. 10/07/17 10/07/18  [provider]  cephALEXin (KEFLEX) 500 MG capsule Take 1 capsule (500 mg total) by mouth 4 (four) times daily. 09/16/17   Stegmayer, Joelene Millin A, PA-C  ferrous sulfate 325 (65 FE) MG tablet Take 325 mg by mouth daily.    [provider]  furosemide (LASIX) 20 MG tablet Take 1 tablet (  20 mg total) by mouth daily. 08/10/17 08/10/18  [name], MD  insulin aspart (NOVOLOG) 100 UNIT/ML injection Use as directed 04/12/13   [name], MD  levothyroxine (SYNTHROID, LEVOTHROID) 25 MCG tablet Take 25 mcg by mouth daily.    [provider]  lisinopril (PRINIVIL,ZESTRIL) 20 MG tablet Take 20 mg by mouth daily.    [provider]  Multiple Vitamin (MULTIVITAMIN) tablet Take 1 tablet by mouth daily.    [provider]   mupirocin ointment (BACTROBAN) 2 % Apply to affected area bid 08/10/17   [name], MD  naproxen (NAPROSYN) 250 MG tablet Take by mouth as needed.    [provider]  ondansetron (ZOFRAN) 8 MG tablet Take 1 tablet (8 mg total) by mouth 2 (two) times daily as needed for refractory nausea / vomiting. 10/24/17   Lloyd Huger, MD  pantoprazole (PROTONIX) 40 MG tablet TAKE 1 TABLET (40 MG TOTAL) BY MOUTH DAILY. 04/15/17   [name], MD  pravastatin (PRAVACHOL) 40 MG tablet Take 1 tablet (40 mg total) by mouth at bedtime. Will need office visit for any more refills 04/15/17   [name], MD  prochlorperazine (COMPAZINE) 10 MG tablet Take 1 tablet (10 mg total) by mouth every 6 (six) hours as needed (Nausea or vomiting). 10/24/17   Lloyd Huger, MD  thiamine (VITAMIN B-1) 100 MG tablet Take 1 tablet (100 mg total) by mouth daily. 01/13/17   Gouru, Illene Silver, MD  traMADol (ULTRAM) 50 MG tablet Take 50 mg by mouth every 6 (six) hours as needed. 09/08/17   [provider]    Allergies  Allergen Reactions  . Rofecoxib Nausea Only    Family History  Problem Relation Age of Onset  . Breast cancer Mother   . Diabetes Father   . Cancer Father        Lymphoma  . Alcohol abuse Maternal Uncle   . Diabetes Sister   . Heart disease Unknown        grandfather    Social History Social History   Tobacco Use  . Smoking status: Former Research scientist (life sciences)  . Smokeless tobacco: Current User    Types: Chew  Substance Use Topics  . Alcohol use: Yes    Alcohol/week: 1.8 oz    Types: 3 Cans of beer per week    Comment: nightly  . Drug use: No    Review of Systems Constitutional: Negative for fever. Eyes: Negative for visual complaints ENT: Negative for recent illness/congestion Cardiovascular: Negative for chest pain. Respiratory: Negative for shortness of breath. Gastrointestinal: Negative for abdominal pain, vomiting and diarrhea. Genitourinary: Negative for urinary  compaints Musculoskeletal: Lower extremity edema, wears wraps Skin: Negative for skin complaints  Neurological: Negative for headache All other ROS negative  ____________________________________________   PHYSICAL EXAM:  VITAL SIGNS: ED Triage Vitals  Enc Vitals Group     BP 10/28/17 1154 133/82     Pulse Rate 10/28/17 1154 69     Resp 10/28/17 1154 18     Temp 10/28/17 1154 98.2 F (36.8 C)     Temp Source 10/28/17 1154 Oral     SpO2 10/28/17 1154 100 %     Weight 10/28/17 1155 260 lb (117.9 kg)     Height 10/28/17 1155 5\' 9"  (1.753 m)     Head Circumference --      Peak Flow --      Pain Score 10/28/17 1154 8     Pain Loc --  Pain Edu? --      Excl. in [place]? --     Constitutional: Alert, oriented x4 at this time, no distress. Eyes: Normal exam ENT   Head: Normocephalic and atraumatic.   Mouth/Throat: Mucous membranes are moist. Cardiovascular: Normal rate, regular rhythm. No murmur Respiratory: Normal respiratory effort without tachypnea nor retractions. Breath sounds are clear  Gastrointestinal: Soft and nontender. No distention.   Musculoskeletal: Wraps across lower extremities, denies any tenderness. Neurologic:  Normal speech and language. No gross focal neurologic deficits Skin:  Skin is warm, dry and intact.  Psychiatric: Mood and affect are normal.  ____________________________________________     RADIOLOGY  CT scan of the head and neck are negative  ____________________________________________   INITIAL IMPRESSION / ASSESSMENT AND PLAN / ED COURSE  Pertinent labs & imaging results that were available during my care of the patient were reviewed by me and considered in my medical decision making (see chart for details).  Patient presents to the emergency department after a fall.  Differential would include intracranial hemorrhage, concussion, closed head injury, abrasion or laceration, hypoglycemia, metabolic or infectious abnormality.   Patient's labs are largely at his baseline.  CT scan of the head and neck are normal.  Patient's blood glucose has dropped once again after receiving an additional amp of D50 back into the 40s.  We will dose a third dose of dextrose/D50 and then start the patient on a D10 drip, and admit to the hospital for further treatment.  Patient agreeable to this plan of care.   CRITICAL CARE Performed by: Harvest Dark   Total critical care time: 30 minutes  Critical care time was exclusive of separately billable procedures and treating other patients.  Critical care was necessary to treat or prevent imminent or life-threatening deterioration.  Critical care was time spent personally by me on the following activities: development of treatment plan with patient and/or surrogate as well as nursing, discussions with consultants, evaluation of patient's response to treatment, examination of patient, obtaining history from patient or surrogate, ordering and performing treatments and interventions, ordering and review of laboratory studies, ordering and review of radiographic studies, pulse oximetry and re-evaluation of patient's condition.   ____________________________________________   FINAL CLINICAL IMPRESSION(S) / ED DIAGNOSES  Hypoglycemia    Harvest Dark, MD 10/28/17 1259

## 2017-10-28 NOTE — H&P (Signed)
[place] [place] NAME: Gary [name]    MR#:  [id number]  DATE OF BIRTH:  11-Apr-1946  DATE OF ADMISSION:  10/28/2017  PRIMARY CARE PHYSICIAN: [name], MD   REQUESTING/REFERRING PHYSICIAN: Harvest Dark MD  CHIEF COMPLAINT:   Chief Complaint  Patient presents with  . Fall  . Hypoglycemia    HISTORY OF PRESENT ILLNESS: Gary [name]  is a 72 y.o. male with a known history of depression, diabetes type 2, gastroparesis, Graves' disease, essential hypertension, mantle cell lymphoma and peripheral neuropathy who is being admitted   Patient states that he has been recently started on a new insulin pump by his endocrinologist.  He states that when he woke up this morning his blood sugar was 400 so he took a bolus of insulin.  He does not recall how much insulin he took.  Patient subsequently had an episode of being unresponsive.EMS was called, the patient was found to have a blood glucose of 30 and was given an amp of D50 EMS notes the blood glucose increased to over 80.  Upon arrival to the emergency department the blood glucose is once again decreased to 40 and the patient feels somnolent and lightheaded.    Patient was started on a D5 drip.  Blood sugars now improved.  He does feeling better.  He reports normally his blood sugar is usually under 200.  He denies any chest pain or shortness of breath Denies any nausea vomiting or diarrhea.     PAST MEDICAL HISTORY:   Past Medical History:  Diagnosis Date  . Depression   . 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 Gastroparesis   . Graves disease   . Hypercholesterolemia   . Hypertension   . Mantle cell lymphoma ([place])   . Peripheral neuropathy     PAST SURGICAL HISTORY:  Past Surgical History:  Procedure Laterality Date  . NEPHRECTOMY     right    SOCIAL HISTORY:  Social History   Tobacco Use  . Smoking status: Former Research scientist (life sciences)   . Smokeless tobacco: Current User    Types: Chew  Substance Use Topics  . Alcohol use: Yes    Alcohol/week: 1.8 oz    Types: 3 Cans of beer per week    Comment: nightly    FAMILY HISTORY:  Family History  Problem Relation Age of Onset  . Breast cancer Mother   . Diabetes Father   . Cancer Father        Lymphoma  . Alcohol abuse Maternal Uncle   . Diabetes Sister   . Heart disease Unknown        grandfather    DRUG ALLERGIES:  Allergies  Allergen Reactions  . Rofecoxib Nausea Only    REVIEW OF SYSTEMS:   CONSTITUTIONAL: No fever, fatigue or weakness.  EYES: No blurred or double vision.  EARS, NOSE, AND THROAT: No tinnitus or ear pain.  RESPIRATORY: No cough, shortness of breath, wheezing or hemoptysis.  CARDIOVASCULAR: No chest pain, orthopnea, edema.  GASTROINTESTINAL: No nausea, vomiting, diarrhea or abdominal pain.  GENITOURINARY: No dysuria, hematuria.  ENDOCRINE: No polyuria, nocturia,  HEMATOLOGY: No anemia, easy bruising or bleeding SKIN: No rash or lesion. MUSCULOSKELETAL: No joint pain or arthritis.   NEUROLOGIC: No tingling, numbness, weakness.  PSYCHIATRY: No anxiety or depression.   MEDICATIONS AT HOME:  Prior to Admission medications   Medication Sig Start Date End Date Taking?  Authorizing Provider  ACCU-CHEK AVIVA PLUS test strip USE AS INSTRUCTED. USE 1 STRIP EVERY 3 HOURS . ACCU-CHECK AVIVA PLUS TEST STRIP. DX K53.976 01/06/17   [provider]  acyclovir (ZOVIRAX) 400 MG tablet Take 1 tablet (400 mg total) by mouth daily. 10/24/17   Lloyd Huger, MD  aspirin EC 325 MG tablet Take 325 mg by mouth every 6 (six) hours as needed for mild pain.     [provider]  carvedilol (COREG) 3.125 MG tablet Take by mouth. 10/07/17 10/07/18  [provider]  cephALEXin (KEFLEX) 500 MG capsule Take 1 capsule (500 mg total) by mouth 4 (four) times daily. 09/16/17   Stegmayer, Joelene Millin A, PA-C  ferrous sulfate 325 (65 FE) MG tablet  Take 325 mg by mouth daily.    [provider]  furosemide (LASIX) 20 MG tablet Take 1 tablet (20 mg total) by mouth daily. 08/10/17 08/10/18  [name], MD  insulin aspart (NOVOLOG) 100 UNIT/ML injection Use as directed 04/12/13   [name], MD  levothyroxine (SYNTHROID, LEVOTHROID) 25 MCG tablet Take 25 mcg by mouth daily.    [provider]  lisinopril (PRINIVIL,ZESTRIL) 20 MG tablet Take 20 mg by mouth daily.    [provider]  Multiple Vitamin (MULTIVITAMIN) tablet Take 1 tablet by mouth daily.    [provider]  mupirocin ointment (BACTROBAN) 2 % Apply to affected area bid 08/10/17   [name], MD  naproxen (NAPROSYN) 250 MG tablet Take by mouth as needed.    [provider]  ondansetron (ZOFRAN) 8 MG tablet Take 1 tablet (8 mg total) by mouth 2 (two) times daily as needed for refractory nausea / vomiting. 10/24/17   Lloyd Huger, MD  pantoprazole (PROTONIX) 40 MG tablet TAKE 1 TABLET (40 MG TOTAL) BY MOUTH DAILY. 04/15/17   [name], MD  pravastatin (PRAVACHOL) 40 MG tablet Take 1 tablet (40 mg total) by mouth at bedtime. Will need office visit for any more refills 04/15/17   [name], MD  prochlorperazine (COMPAZINE) 10 MG tablet Take 1 tablet (10 mg total) by mouth every 6 (six) hours as needed (Nausea or vomiting). 10/24/17   Lloyd Huger, MD  thiamine (VITAMIN B-1) 100 MG tablet Take 1 tablet (100 mg total) by mouth daily. 01/13/17   Gouru, Illene Silver, MD  traMADol (ULTRAM) 50 MG tablet Take 50 mg by mouth every 6 (six) hours as needed. 09/08/17   [provider]      PHYSICAL EXAMINATION:   VITAL SIGNS: Blood pressure 134/69, pulse 85, temperature 98.2 F (36.8 C), temperature source Oral, resp. rate 14, height 5\' 9"  (1.753 m), weight 260 lb (117.9 kg), SpO2 99 %.  GENERAL:  72 y.o.-year-old patient lying in the bed with no acute distress.  EYES: Pupils equal, round, reactive to light and  accommodation. No scleral icterus. Extraocular muscles intact.  HEENT: Head atraumatic, normocephalic. Oropharynx and nasopharynx clear.  NECK:  Supple, no jugular venous distention. No thyroid enlargement, no tenderness.  LUNGS: Normal breath sounds bilaterally, no wheezing, rales,rhonchi or crepitation. No use of accessory muscles of respiration.  CARDIOVASCULAR: S1, S2 normal. No murmurs, rubs, or gallops.  ABDOMEN: Soft, nontender, nondistended. Bowel sounds present. No organomegaly or mass.  EXTREMITIES: No pedal edema, cyanosis, or clubbing.  NEUROLOGIC: Cranial nerves II through XII are intact. Muscle strength 5/5 in all extremities. Sensation intact. Gait not checked.  PSYCHIATRIC: The patient is alert and oriented x 3.  SKIN: Bilateral hands with  bluish discoloration which he states is chronic   LABORATORY PANEL:   CBC Recent Labs  Lab 10/28/17 1158  WBC 6.3  HGB 12.6*  HCT 39.0*  PLT 167  MCV 87.0  MCH 28.2  MCHC 32.4  RDW 22.0*  LYMPHSABS 0.6*  MONOABS 0.7  EOSABS 0.3  BASOSABS 0.1   ------------------------------------------------------------------------------------------------------------------  Chemistries  Recent Labs  Lab 10/28/17 1158  NA 141  K 3.9  CL 104  CO2 28  GLUCOSE 56*  BUN 14  CREATININE 1.03  CALCIUM 8.4*  AST 32  ALT 16*  ALKPHOS 230*  BILITOT 1.0   ------------------------------------------------------------------------------------------------------------------ estimated creatinine clearance is 85.8 mL/min (by C-G formula based on SCr of 1.03 mg/dL). ------------------------------------------------------------------------------------------------------------------ No results for input(s): TSH, T4TOTAL, T3FREE, THYROIDAB in the last 72 hours.  Invalid input(s): FREET3   Coagulation profile No results for input(s): INR, PROTIME in the last 168  hours. ------------------------------------------------------------------------------------------------------------------- No results for input(s): DDIMER in the last 72 hours. -------------------------------------------------------------------------------------------------------------------  Cardiac Enzymes No results for input(s): CKMB, TROPONINI, MYOGLOBIN in the last 168 hours.  Invalid input(s): CK ------------------------------------------------------------------------------------------------------------------ Invalid input(s): POCBNP  ---------------------------------------------------------------------------------------------------------------  Urinalysis    Component Value Date/Time   COLORURINE YELLOW (A) 07/28/2017 1544   APPEARANCEUR HAZY (A) 07/28/2017 1544   APPEARANCEUR Cloudy 06/13/2014 0823   LABSPEC 1.010 07/28/2017 1544   LABSPEC 1.018 06/13/2014 0823   PHURINE 5.0 07/28/2017 1544   GLUCOSEU NEGATIVE 07/28/2017 1544   GLUCOSEU 50 mg/dL 06/13/2014 0823   HGBUR NEGATIVE 07/28/2017 1544   BILIRUBINUR NEGATIVE 07/28/2017 1544   BILIRUBINUR Negative 06/13/2014 0823   KETONESUR NEGATIVE 07/28/2017 1544   PROTEINUR NEGATIVE 07/28/2017 1544   NITRITE NEGATIVE 07/28/2017 1544   LEUKOCYTESUR TRACE (A) 07/28/2017 1544   LEUKOCYTESUR 2+ 06/13/2014 0823     RADIOLOGY: Ct Head Wo Contrast  Result Date: 10/28/2017 CLINICAL DATA:  Head trauma, ataxia EXAM: CT HEAD WITHOUT CONTRAST CT CERVICAL SPINE WITHOUT CONTRAST TECHNIQUE: Multidetector CT imaging of the head and cervical spine was performed following the standard protocol without intravenous contrast. Multiplanar CT image reconstructions of the cervical spine were also generated. COMPARISON:  07/28/2017 FINDINGS: CT HEAD FINDINGS Brain: No evidence of acute infarction, hemorrhage, extra-axial collection, ventriculomegaly, or mass effect. Generalized cerebral atrophy. Periventricular white matter low attenuation likely  secondary to microangiopathy. Vascular: Cerebrovascular atherosclerotic calcifications are noted. Skull: Negative for fracture or focal lesion. Sinuses/Orbits: Visualized portions of the orbits are unremarkable. Visualized portions of the paranasal sinuses and mastoid air cells are unremarkable. Other: None. CT CERVICAL SPINE FINDINGS Alignment: Normal. Skull base and vertebrae: No acute fracture. No primary bone lesion or focal pathologic process. Soft tissues and spinal canal: No prevertebral fluid or swelling. No visible canal hematoma. Disc levels: Degenerative disc disease with disc height loss at C3-4 and C6-7. Bilateral uncovertebral degenerative changes at C3-4 and mild left facet arthropathy. Mild bilateral facet arthropathy at C4-5 and C5-6. Upper chest: Lung apices are clear. Other: Small amount air in the right jugular vein likely iatrogenic. IMPRESSION: 1. No acute intracranial pathology. 2.  No acute osseous injury of the cervical spine. Electronically Signed   By: Kathreen Devoid   On: 10/28/2017 12:28   Ct Cervical Spine Wo Contrast  Result Date: 10/28/2017 CLINICAL DATA:  Head trauma, ataxia EXAM: CT HEAD WITHOUT CONTRAST CT CERVICAL SPINE WITHOUT CONTRAST TECHNIQUE: Multidetector CT imaging of the head and cervical spine was performed following the standard protocol without intravenous contrast. Multiplanar CT image reconstructions of the cervical spine were also generated. COMPARISON:  07/28/2017 FINDINGS: CT HEAD FINDINGS Brain: No evidence of acute infarction, hemorrhage, extra-axial collection, ventriculomegaly, or mass effect. Generalized cerebral atrophy. Periventricular white matter low attenuation likely secondary to microangiopathy. Vascular: Cerebrovascular atherosclerotic calcifications are noted. Skull: Negative for fracture or focal lesion. Sinuses/Orbits: Visualized portions of the orbits are unremarkable. Visualized portions of the paranasal sinuses and mastoid air cells are  unremarkable. Other: None. CT CERVICAL SPINE FINDINGS Alignment: Normal. Skull base and vertebrae: No acute fracture. No primary bone lesion or focal pathologic process. Soft tissues and spinal canal: No prevertebral fluid or swelling. No visible canal hematoma. Disc levels: Degenerative disc disease with disc height loss at C3-4 and C6-7. Bilateral uncovertebral degenerative changes at C3-4 and mild left facet arthropathy. Mild bilateral facet arthropathy at C4-5 and C5-6. Upper chest: Lung apices are clear. Other: Small amount air in the right jugular vein likely iatrogenic. IMPRESSION: 1. No acute intracranial pathology. 2.  No acute osseous injury of the cervical spine. Electronically Signed   By: Kathreen Devoid   On: 10/28/2017 12:28    EKG: Orders placed or performed during the hospital encounter of 08/03/17  . ED EKG  . ED EKG  . EKG 12-Lead  . EKG 12-Lead    IMPRESSION AND PLAN: The patient is a 72 year old male with history of diabetes essential hypertension hyperlipidemia and mantle cell lymphoma presents to the ED after an unresponsive episode  1.  Hypoglycemia Unclear if patient took accidentally too much insulin His insulin pump has been removed He is started on D5 We will follow blood sugars closely Diabetic coordinator consult Check hemoglobin A1c  2 essential hypertension continue therapy with Coreg and lisinopril  3.  Hypothyroidism continue levothyroxine Check TSH  4.  Hyperlipidemia unspecified continue Pravachol  5.  GERD continue Protonix  6.  Generalized weakness PT evaluation  7.  Miscellaneous Lovenox for DVT prophylaxis    All the records are reviewed and case discussed with ED provider. Management plans discussed with the patient, family and they are in agreement.  CODE STATUS: Code Status History    Date Active Date Inactive Code Status Order ID Comments User Context   01/12/2017 02:38 01/13/2017 20:44 Full Code [id number]  Hugelmeyer, Ubaldo Glassing, DO  Inpatient       TOTAL TIME TAKING CARE OF THIS PATIENT: 55 minutes.    Dustin Flock M.D on 10/28/2017 at 2:32 PM  Between 7am to 6pm - Pager - [phone]  After 6pm go to [email or web address] - password [email or web address] [facility]  Office  [phone]  CC: Primary care physician; [name], MD

## 2017-10-29 ENCOUNTER — Encounter: Payer: Self-pay | Admitting: Pharmacist

## 2017-10-29 ENCOUNTER — Other Ambulatory Visit: Payer: Self-pay

## 2017-10-29 DIAGNOSIS — E039 Hypothyroidism, unspecified: Secondary | ICD-10-CM | POA: Diagnosis not present

## 2017-10-29 DIAGNOSIS — M21332 Wrist drop, left wrist: Secondary | ICD-10-CM | POA: Diagnosis not present

## 2017-10-29 DIAGNOSIS — E11649 Type 2 diabetes mellitus with hypoglycemia without coma: Secondary | ICD-10-CM | POA: Diagnosis not present

## 2017-10-29 DIAGNOSIS — E162 Hypoglycemia, unspecified: Secondary | ICD-10-CM | POA: Diagnosis not present

## 2017-10-29 DIAGNOSIS — I10 Essential (primary) hypertension: Secondary | ICD-10-CM | POA: Diagnosis not present

## 2017-10-29 LAB — BASIC METABOLIC PANEL
Anion gap: 13 (ref 5–15)
BUN: 18 mg/dL (ref 6–20)
CALCIUM: 8.1 mg/dL — AB (ref 8.9–10.3)
CO2: 22 mmol/L (ref 22–32)
Chloride: 102 mmol/L (ref 101–111)
Creatinine, Ser: 1.15 mg/dL (ref 0.61–1.24)
GFR calc Af Amer: >60 mL/min (ref >60)
GFR calc non Af Amer: >60 mL/min (ref >60)
GLUCOSE: 394 mg/dL — AB (ref 65–99)
POTASSIUM: 4.9 mmol/L (ref 3.5–5.1)
SODIUM: 137 mmol/L (ref 135–145)

## 2017-10-29 LAB — CBC
HCT: 37.4 % — ABNORMAL LOW (ref 40.0–52.0)
Hemoglobin: 11.9 g/dL — ABNORMAL LOW (ref 13.0–18.0)
MCH: 28.3 pg (ref 26.0–34.0)
MCHC: 31.9 g/dL — AB (ref 32.0–36.0)
MCV: 88.7 fL (ref 80.0–100.0)
PLATELETS: 147 10*3/uL — AB (ref 150–440)
RBC: 4.21 MIL/uL — AB (ref 4.40–5.90)
RDW: 21.9 % — AB (ref 11.5–14.5)
WBC: 5.4 10*3/uL (ref 3.8–10.6)

## 2017-10-29 LAB — GLUCOSE, CAPILLARY
Glucose-Capillary: 299 mg/dL — ABNORMAL HIGH (ref 65–99)
Glucose-Capillary: 339 mg/dL — ABNORMAL HIGH (ref 65–99)
Glucose-Capillary: 376 mg/dL — ABNORMAL HIGH (ref 65–99)
Glucose-Capillary: 388 mg/dL — ABNORMAL HIGH (ref 65–99)

## 2017-10-29 MED ORDER — INSULIN ASPART 100 UNIT/ML ~~LOC~~ SOLN: SUBCUTANEOUS | 12 refills | Status: DC

## 2017-10-29 MED ORDER — INSULIN GLARGINE 100 UNIT/ML ~~LOC~~ SOLN
17.0000 [IU] | Freq: Every day | SUBCUTANEOUS | Status: DC
  Filled 2017-10-29: qty 0.17

## 2017-10-29 MED ORDER — INSULIN ASPART 100 UNIT/ML ~~LOC~~ SOLN
0.0000 [IU] | Freq: Three times a day (TID) | SUBCUTANEOUS | Status: DC
  Administered 2017-10-29: 09:00:00 9 [IU] via SUBCUTANEOUS
  Filled 2017-10-29: qty 1

## 2017-10-29 MED ORDER — INSULIN ASPART 100 UNIT/ML ~~LOC~~ SOLN
6.0000 [IU] | Freq: Three times a day (TID) | SUBCUTANEOUS | Status: DC
  Administered 2017-10-29: 09:00:00 6 [IU] via SUBCUTANEOUS
  Filled 2017-10-29: qty 1

## 2017-10-29 MED ORDER — INSULIN ASPART 100 UNIT/ML ~~LOC~~ SOLN: 0.0000 [IU] | Freq: Three times a day (TID) | SUBCUTANEOUS | Status: DC

## 2017-10-29 MED ORDER — INSULIN ASPART 100 UNIT/ML ~~LOC~~ SOLN: 0.0000 [IU] | Freq: Every day | SUBCUTANEOUS | Status: DC

## 2017-10-29 NOTE — Progress Notes (Signed)
Inpatient Diabetes Program Recommendations  AACE/ADA: New Consensus Statement on Inpatient Glycemic Control (2015)  Target Ranges:  Prepandial:   less than 140 mg/dL      Peak postprandial:   less than 180 mg/dL (1-2 hours)      Critically ill patients:  140 - 180 mg/dL   Lab Results  Component Value Date   GLUCAP 388 (H) 10/29/2017   HGBA1C 5.6 10/28/2017     Inpatient Diabetes Program Recommendations:  Met with the patient and his wife regarding his recent admission related to hypoglycemia.  We called the 1-800 [employer] line to do a safety check on his insulin pump.  In reviewing the pump, it appears that he took insulin based on the pump settings at 0847 am, he then overrided the pump and gave additional insulin at approximately 0901 am.  The pump is functioning correctly I instructed him NOT to over ride the pump-the computer in the pump is designed to give appropriate insulin doses.  The patient successfully re-inserted his insulin pump and settings remain the same.   We also discovered, that he said he had a blood sugar of 26m/dl on Wednesday night so he ate " half a bag of popcorn".  After additional prodding, we also discovered that he "drank a soda and maybe a couple of beers"  Reviewed the treatment of hypoglycemia- wife tells me she tried to give him cake icing but he would not take it- she then gave him a glucose tablet which he spit out. I reviewed the treatment of hypoglycemia with them- encouraging, he is to take 4 oz of juice or soda or 4 glucose tablets.   After further discussion, I have discovered that he does not check his blood sugar until after he eats - I tried to explain (verbal and picture) the importance of checking the blood sugar before he eats for a more accurate, safe dose of insulin- he became very frustrated, angry, complaining of "I've lost my attitude".  "I've always done it that way"- I tried to explain the changes in insulin since he first started diabetes  but was not open to a discussion.  His wife was attentive and verbalized understanding.    JGentry Fitz RN, BA, MHA, CDE Diabetes Coordinator Inpatient Diabetes Program  [phone](Team Pager) [phone]([facility] [place] 10/29/2017 12:35 PM

## 2017-10-29 NOTE — Evaluation (Signed)
Physical Therapy Evaluation Patient Details Name: Gary [name] MRN: [id number] DOB: 1946/01/31 Today's Date: 10/29/2017   History of Present Illness  Gary [name]  is a 72 y.o. male with a known history of depression, diabetes type 2, gastroparesis, Graves' disease, essential hypertension, mantle cell lymphoma and peripheral neuropathy who is being admitted.   Patient stated that he has been recently started on a new insulin pump by his endocrinologist.  He states that when he woke up in the morning his blood sugar was 400 so he took a bolus of insulin.  He does not recall how much insulin he took.  Patient subsequently had an episode of being unresponsive. EMS was called, the patient was found to have a blood glucose of 30 and was given an amp of D50.  EMS notes the blood glucose increased to over 80. Upon arrival to the emergency department the blood glucose is once again decreased to 40 and the patient felt somnolent and lightheaded.   Patient was started on a D5 drip.  Blood sugars now improved and p feeling better.  Pt reports normally his blood sugar is usually under 200.  He denies any chest pain or shortness of breath.  Pt was admitted on 10/28/17 with diagnosis: hypoglycemia.  Clinical Impression  Prior to hospital admission, pt was ambulating with SPC (pt using SPC in L UE).  Pt with h/o R humeral fx about 3-4 months ago (recent imaging 10/19/17 showing comminuted R proximal humerus fx incompletely healed); pt reports he uses sling intermittently and does not do activities that put weight through R UE but unable to state any known WB'ing precautions.  Pt reports seeing Dr. Harlow Mares (orthopedics) but does not have a follow-up visit planned (last [place] status in notes in October was [place]; last seen end of December).  Left message on Dr. Harlow Mares cell phone to call therapist back (number given to therapist by ortho unit secretary) and also called Dr. Harlow Mares clinic who attempted to send urgent message to  verify [place] status of R UE; as of this note therapist had not received call back yet.  Pt demonstrating L wrist drop (pt reportedly found on L side in bathroom) and pt reports injury new (MD notified and came to assess pt; also see OT note for further details on L UE).  OT recommending having L wrist brace if pt were to attempt to use SPC (pt to be given script for L wrist brace but no brace available within hospital) so pt ambulating without AD for therapy session (deferred use of platform RW d/t unable to clarify any WB'ing precautions for R UE).  Pt lives with his wife in 1 level home with ramp to enter.  Currently pt is CGA with standing and ambulating 40 feet in room no AD (pt declined to ambulate further; no loss of balance noted during session).  Pt would benefit from skilled PT to address noted impairments and functional limitations (see below for any additional details).  Upon hospital discharge, recommend pt discharge to home with HHPT.  Pt and pt's wife educated on [place] concerns with L UE and order for L wrist brace; both verbalizing good understanding.    Follow Up Recommendations Home health PT;Supervision for mobility/OOB    Equipment Recommendations  (pt plans to use [facility])    Recommendations for Other Services       Precautions / Restrictions Precautions Precautions: Fall Restrictions Other Position/Activity Restrictions: Per imaging 10/19/17 pt with comminuted R proximal humerus fx (  incompletely healed).  Pt reports not using R UE much.  L UE wrist drop noted.      Mobility  Bed Mobility Overal bed mobility: Needs Assistance Bed Mobility: Sit to Supine       Sit to supine: Mod assist   General bed mobility comments: pt's wife assisted pt with lifting B LE's back into bed (pt's wife reports she always has to assist him with this)  Transfers Overall transfer level: Needs assistance Equipment used: None Transfers: Sit to/from Stand Sit to Stand: Min guard Stand pivot  transfers: Min assist       General transfer comment: Pt stood with mild increased effort using L UE (pt did not use R UE and held R UE against his trunk)  Ambulation/Gait Ambulation/Gait assistance: Min guard Ambulation Distance (Feet): 40 Feet Assistive device: None   Gait velocity: decreased   General Gait Details: increased BOS; mild increased lateral sway but steady without loss of balance; decreased B step length (partial step through gait pattern); no loss of balance with turns  Stairs            Wheelchair Mobility    Modified Rankin (Stroke Patients Only)       Balance Overall balance assessment: Needs assistance;History of Falls Sitting-balance support: No upper extremity supported;Feet supported Sitting balance-Leahy Scale: Good Sitting balance - Comments: steady sitting reaching within BOS   Standing balance support: No upper extremity supported;During functional activity Standing balance-Leahy Scale: Good Standing balance comment: no loss of balance with ambulation in pt's room (no UE support)                             Pertinent Vitals/Pain Pain Assessment: No/denies pain  Vitals (HR and O2 on room air) stable and WFL throughout treatment session.    Home Living Family/patient expects to be discharged to:: Private residence Living Arrangements: Spouse/significant other Available Help at Discharge: Family Type of Home: House Home Access: Stairs to enter;Ramped entrance Entrance Stairs-Rails: Right Entrance Stairs-Number of Steps: 2 Home Layout: [place]: [place] - 2 wheels;Cane - single point      Prior Function Level of Independence: Independent with assistive device(s)         Comments: Pt reports using SPC for ambulation.  Pt and pt's wife reports no recent falls in past month (other than fall just prior to admission) but 1-2 months ago pt was falling every 2-3 days.     Hand Dominance         Extremity/Trunk Assessment   Upper Extremity Assessment Upper Extremity Assessment: Defer to OT evaluation RUE Deficits / Details: Per OT note:  Previous Right humerus fracture Oct 2018. Limited active ROM for shoulder noted. Per last Ortho note dated 10/19/17, pt fracture is begining to heal. No weight bearing precautions in chart RUE Sensation: history of peripheral neuropathy;decreased light touch RUE Coordination: (Overall WFL's) LUE Deficits / Details: Per OT note:  "Pt with left wrist drop that he states is new since his latest fall. He is unable to actively extend this left wrist/fingers. He has thumb palmar ABD/ADD but no ABD/ADD of thumb in neutral position. Elbow active ROM is WFL's; limited shoulder flexion to ~100* noted. Recommend f/u with Ortho MD to r/o possible radial nerve or other nerve damage following recent fall that ocurred after hypoglycemic event for which he was admitted., as well as recommendations for possible f/u Out-pt Occupational Therapy/hand therapy." LUE Sensation:  history of peripheral neuropathy;decreased light touch LUE Coordination: decreased fine motor;decreased gross motor    Lower Extremity Assessment Lower Extremity Assessment: Generalized weakness    Cervical / Trunk Assessment Cervical / Trunk Assessment: Normal  Communication   Communication: No difficulties  Cognition Arousal/Alertness: Awake/alert Behavior During Therapy: WFL for tasks assessed/performed Overall Cognitive Status: Within Functional Limits for tasks assessed                                        General Comments   Nursing cleared pt for participation in physical therapy.  Pt agreeable to PT session.    Exercises     Assessment/Plan    PT Assessment Patient needs continued PT services  PT Problem List Decreased strength;Decreased balance;Decreased mobility       PT Treatment Interventions DME instruction;Gait training;Stair training;Functional mobility  training;Therapeutic activities;Therapeutic exercise;Balance training;Patient/family education    PT Goals (Current goals can be found in the Care Plan section)  Acute Rehab PT Goals Patient Stated Goal: to go home today PT Goal Formulation: With patient Time For Goal Achievement: 11/12/17 Potential to Achieve Goals: Good    Frequency Min 2X/week   Barriers to discharge        Co-evaluation               AM-PAC PT "6 Clicks" Daily Activity  Outcome Measure Difficulty turning over in bed (including adjusting bedclothes, sheets and blankets)?: A Little Difficulty moving from lying on back to sitting on the side of the bed? : Unable Difficulty sitting down on and standing up from a chair with arms (e.g., wheelchair, bedside commode, etc,.)?: A Little Help needed moving to and from a bed to chair (including a wheelchair)?: A Little Help needed walking in hospital room?: A Little Help needed climbing 3-5 steps with a railing? : A Little 6 Click Score: 16    End of Session Equipment Utilized During Treatment: Gait belt Activity Tolerance: Patient tolerated treatment well Patient left: in bed;with call bell/phone within reach;with bed alarm set;with family/visitor present Nurse Communication: Mobility status;Precautions PT Visit Diagnosis: Other abnormalities of gait and mobility (R26.89);Muscle weakness (generalized) (M62.81);History of falling (Z91.81)    Time: 1114(1300-1315)-1145 PT Time Calculation (min) (ACUTE ONLY): 31 min   Charges:   PT Evaluation $PT Eval Low Complexity: 1 Low PT Treatments $Therapeutic Activity: 8-22 mins   PT G CodesLeitha Bleak, PT 10/29/17, 3:13 PM [phone]

## 2017-10-29 NOTE — Discharge Summary (Signed)
[facility] NAME: Carmello Cabiness    MR#:  [id number]  DATE OF BIRTH:  1946-05-09  DATE OF ADMISSION:  10/28/2017 ADMITTING PHYSICIAN: Dustin Flock, MD  DATE OF DISCHARGE: 10/29/2017  PRIMARY CARE PHYSICIAN: [name], MD    ADMISSION DIAGNOSIS:  Hypoglycemia [E16.2] B12 deficiency [E53.8] Fall, initial encounter [W19.XXXA]  DISCHARGE DIAGNOSIS:  Active Problems:   Hypoglycemia   SECONDARY DIAGNOSIS:   Past Medical History:  Diagnosis Date  . Depression   . 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 Gastroparesis   . Graves disease   . Hypercholesterolemia   . Hypertension   . Mantle cell lymphoma ([place])   . Peripheral neuropathy     HOSPITAL COURSE:   1.  Hypoglycemia  In type 1 diabetes.  The patient does not want to go on Lantus insulin and short acting insulin.  He wants to go back on his insulin pump.  I had the diabetes coordinator speak with him about this.  The patient likely bolused too much which causes hypoglycemia.  Diabetes coordinator talk to him at length with adjustment of his insulin pump.  Follow-up with Dr. Eddie Dibbles endocrinology. 2.  Left wrist drop after fall.  The patient did not want further workup here in the hospital and wanted to follow-up as outpatient.  I did order a wrist brace.  Outpatient occupational therapy and orthopedic follow-up.  The patient refused any further testing here in the hospital. 3.  Essential hypertension on Coreg and lisinopril 4.  Hypothyroidism unspecified on levothyroxine 5.  Hyperlipidemia unspecified on Pravachol 6.  GERD on Protonix 7.  Weakness.  Physical therapy recommended home health but the patient declined.  DISCHARGE CONDITIONS:   Fair  CONSULTS OBTAINED:  None  DRUG ALLERGIES:   Allergies  Allergen Reactions  . Rofecoxib Nausea Only    DISCHARGE MEDICATIONS:   Allergies as of 10/29/2017       Reactions   Rofecoxib Nausea Only      Medication List    STOP taking these medications   cephALEXin 500 MG capsule Commonly known as:  KEFLEX   mupirocin ointment 2 % Commonly known as:  BACTROBAN   thiamine 100 MG tablet Commonly known as:  VITAMIN B-1     TAKE these medications   ACCU-CHEK AVIVA PLUS test strip Generic drug:  glucose blood USE AS INSTRUCTED. USE 1 STRIP EVERY 3 HOURS . ACCU-CHECK AVIVA PLUS TEST STRIP. DX E10.649   acyclovir 400 MG tablet Commonly known as:  ZOVIRAX Take 1 tablet (400 mg total) by mouth daily.   carvedilol 3.125 MG tablet Commonly known as:  COREG Take 3.125 mg by mouth 2 (two) times daily with a meal.   cyanocobalamin 1000 MCG tablet Take 1,000 mcg by mouth daily.   ferrous sulfate 325 (65 FE) MG tablet Take 325 mg by mouth daily.   furosemide 20 MG tablet Commonly known as:  LASIX Take 1 tablet (20 mg total) by mouth daily.   insulin aspart 100 UNIT/ML injection Commonly known as:  NOVOLOG Basal rate 12am 0.8 units per hour, 9am 0.9 units per hour. (total basal insulin 20.7 units). Carbohydrate ratio 1 units for every 8gm of carbohydrate. Correction factor 1 units for every 50mg /dl over target cbg. Target CBG 80-120 What changed:  additional instructions   levothyroxine 25 MCG tablet Commonly known as:  SYNTHROID, LEVOTHROID Take 25 mcg by mouth daily.  lisinopril 20 MG tablet Commonly known as:  PRINIVIL,ZESTRIL Take 20 mg by mouth daily.   multivitamin tablet Take 1 tablet by mouth daily.   naproxen sodium 220 MG tablet Commonly known as:  ALEVE Take 220 mg by mouth daily as needed.   ondansetron 8 MG tablet Commonly known as:  ZOFRAN Take 1 tablet (8 mg total) by mouth 2 (two) times daily as needed for refractory nausea / vomiting.   pantoprazole 40 MG tablet Commonly known as:  PROTONIX TAKE 1 TABLET (40 MG TOTAL) BY MOUTH DAILY.   pravastatin 40 MG tablet Commonly known as:  PRAVACHOL Take 1 tablet (40  mg total) by mouth at bedtime. Will need office visit for any more refills   prochlorperazine 10 MG tablet Commonly known as:  COMPAZINE Take 1 tablet (10 mg total) by mouth every 6 (six) hours as needed (Nausea or vomiting).        DISCHARGE INSTRUCTIONS:   Follow-up 1 week PMD Follow-up orthopedic surgery 1 week  If you experience worsening of your admission symptoms, develop shortness of breath, life threatening emergency, suicidal or homicidal thoughts you must seek medical attention immediately by calling 911 or calling your MD immediately  if symptoms less severe.  You Must read complete instructions/literature along with all the possible adverse reactions/side effects for all the Medicines you take and that have been prescribed to you. Take any new Medicines after you have completely understood and accept all the possible adverse reactions/side effects.   Please note  You were cared for by a hospitalist during your hospital stay. If you have any questions about your discharge medications or the care you received while you were in the hospital after you are discharged, you can call the unit and asked to speak with the hospitalist on call if the hospitalist that took care of you is not available. Once you are discharged, your primary care physician will handle any further medical issues. Please note that NO REFILLS for any discharge medications will be authorized once you are discharged, as it is imperative that you return to your primary care physician (or establish a relationship with a primary care physician if you do not have one) for your aftercare needs so that they can reassess your need for medications and monitor your lab values.    Today   CHIEF COMPLAINT:   Chief Complaint  Patient presents with  . Fall  . Hypoglycemia    HISTORY OF PRESENT ILLNESS:  Pranay Hilbun  is a 72 y.o. male came in after a fall and hypoglycemic episode   VITAL SIGNS:  Blood pressure (!)  139/56, pulse 100, temperature 97.7 F (36.5 C), temperature source Oral, resp. rate 18, height 5\' 9"  (1.753 m), weight 115.3 kg (254 lb 4 oz), SpO2 100 %.    PHYSICAL EXAMINATION:  GENERAL:  72 y.o.-year-old patient lying in the bed with no acute distress.  EYES: Pupils equal, round, reactive to light and accommodation. No scleral icterus. Extraocular muscles intact.  HEENT: Head atraumatic, normocephalic. Oropharynx and nasopharynx clear.  NECK:  Supple, no jugular venous distention. No thyroid enlargement, no tenderness.  LUNGS: Normal breath sounds bilaterally, no wheezing, rales,rhonchi or crepitation. No use of accessory muscles of respiration.  CARDIOVASCULAR: S1, S2 normal. No murmurs, rubs, or gallops.  ABDOMEN: Soft, non-tender, non-distended. Bowel sounds present. No organomegaly or mass.  EXTREMITIES: No pedal edema, cyanosis, or clubbing.  NEUROLOGIC: Cranial nerves II through XII are intact. Muscle strength 5/5 in all extremities.  Sensation intact. Gait not checked.  PSYCHIATRIC: The patient is alert and oriented x 3.  SKIN: No obvious rash, lesion, or ulcer.   DATA REVIEW:   CBC Recent Labs  Lab 10/29/17 0610  WBC 5.4  HGB 11.9*  HCT 37.4*  PLT 147*    Chemistries  Recent Labs  Lab 10/28/17 1158 10/29/17 0610  NA 141 137  K 3.9 4.9  CL 104 102  CO2 28 22  GLUCOSE 56* 394*  BUN 14 18  CREATININE 1.03 1.15  CALCIUM 8.4* 8.1*  AST 32  --   ALT 16*  --   ALKPHOS 230*  --   BILITOT 1.0  --      RADIOLOGY:  Ct Head Wo Contrast  Result Date: 10/28/2017 CLINICAL DATA:  Head trauma, ataxia EXAM: CT HEAD WITHOUT CONTRAST CT CERVICAL SPINE WITHOUT CONTRAST TECHNIQUE: Multidetector CT imaging of the head and cervical spine was performed following the standard protocol without intravenous contrast. Multiplanar CT image reconstructions of the cervical spine were also generated. COMPARISON:  07/28/2017 FINDINGS: CT HEAD FINDINGS Brain: No evidence of acute  infarction, hemorrhage, extra-axial collection, ventriculomegaly, or mass effect. Generalized cerebral atrophy. Periventricular white matter low attenuation likely secondary to microangiopathy. Vascular: Cerebrovascular atherosclerotic calcifications are noted. Skull: Negative for fracture or focal lesion. Sinuses/Orbits: Visualized portions of the orbits are unremarkable. Visualized portions of the paranasal sinuses and mastoid air cells are unremarkable. Other: None. CT CERVICAL SPINE FINDINGS Alignment: Normal. Skull base and vertebrae: No acute fracture. No primary bone lesion or focal pathologic process. Soft tissues and spinal canal: No prevertebral fluid or swelling. No visible canal hematoma. Disc levels: Degenerative disc disease with disc height loss at C3-4 and C6-7. Bilateral uncovertebral degenerative changes at C3-4 and mild left facet arthropathy. Mild bilateral facet arthropathy at C4-5 and C5-6. Upper chest: Lung apices are clear. Other: Small amount air in the right jugular vein likely iatrogenic. IMPRESSION: 1. No acute intracranial pathology. 2.  No acute osseous injury of the cervical spine. Electronically Signed   By: Kathreen Devoid   On: 10/28/2017 12:28   Ct Cervical Spine Wo Contrast  Result Date: 10/28/2017 CLINICAL DATA:  Head trauma, ataxia EXAM: CT HEAD WITHOUT CONTRAST CT CERVICAL SPINE WITHOUT CONTRAST TECHNIQUE: Multidetector CT imaging of the head and cervical spine was performed following the standard protocol without intravenous contrast. Multiplanar CT image reconstructions of the cervical spine were also generated. COMPARISON:  07/28/2017 FINDINGS: CT HEAD FINDINGS Brain: No evidence of acute infarction, hemorrhage, extra-axial collection, ventriculomegaly, or mass effect. Generalized cerebral atrophy. Periventricular white matter low attenuation likely secondary to microangiopathy. Vascular: Cerebrovascular atherosclerotic calcifications are noted. Skull: Negative for fracture  or focal lesion. Sinuses/Orbits: Visualized portions of the orbits are unremarkable. Visualized portions of the paranasal sinuses and mastoid air cells are unremarkable. Other: None. CT CERVICAL SPINE FINDINGS Alignment: Normal. Skull base and vertebrae: No acute fracture. No primary bone lesion or focal pathologic process. Soft tissues and spinal canal: No prevertebral fluid or swelling. No visible canal hematoma. Disc levels: Degenerative disc disease with disc height loss at C3-4 and C6-7. Bilateral uncovertebral degenerative changes at C3-4 and mild left facet arthropathy. Mild bilateral facet arthropathy at C4-5 and C5-6. Upper chest: Lung apices are clear. Other: Small amount air in the right jugular vein likely iatrogenic. IMPRESSION: 1. No acute intracranial pathology. 2.  No acute osseous injury of the cervical spine. Electronically Signed   By: Kathreen Devoid   On: 10/28/2017 12:28  Management plans discussed with the patient, family and they are in agreement.  CODE STATUS:     Code Status Orders  (From admission, onward)        Start     Ordered   10/28/17 1721  Full code  Continuous     10/28/17 1720    Code Status History    Date Active Date Inactive Code Status Order ID Comments User Context   01/12/2017 02:38 01/13/2017 20:44 Full Code [id number]  Hugelmeyer, Ubaldo Glassing, DO Inpatient      TOTAL TIME TAKING CARE OF THIS PATIENT: 35 minutes.    Loletha Grayer M.D on 10/29/2017 at 2:40 PM  Between 7am to 6pm - Pager - [phone]  After 6pm go to [email or web address] - password [email or web address]  [facility] Office  [phone]  CC: Primary care physician; [name], MD

## 2017-10-29 NOTE — Care Management Obs Status (Signed)
[place] NOTIFICATION   Patient Details  Name: Gary [name] MRN: [id number] Date of Birth: 09/16/1946   Medicare Observation Status Notification Given:  Yes  Pt declined signature.  Marshell Garfinkel, RN 10/29/2017, 12:42 PM

## 2017-10-29 NOTE — Progress Notes (Addendum)
Inpatient Diabetes Program Recommendations  AACE/ADA: New Consensus Statement on Inpatient Glycemic Control (2015)  Target Ranges:  Prepandial:   less than 140 mg/dL      Peak postprandial:   less than 180 mg/dL (1-2 hours)      Critically ill patients:  140 - 180 mg/dL   Lab Results  Component Value Date   GLUCAP 388 (H) 10/29/2017   HGBA1C 5.6 10/28/2017    Review of Glycemic Control  Results for Gary, [name] (MRN [id number]) as of 10/29/2017 07:51  Ref. Range 10/28/2017 17:49 10/28/2017 21:02 10/29/2017 00:20 10/29/2017 05:54 10/29/2017 07:24  Glucose-Capillary Latest Ref Range: 65 - 99 mg/dL 264 (H) 402 (H) 339 (H) 376 (H) 388 (H)    History:DMType 1 (diagnosed at age 89), Mantle Cell Lymphoma  Home DM Meds:Insulin Pump (see below for settings)  Current Insulin Orders:Lantus 17 units qam, Novolog 0-15 units tid, Novolog 0-5 units qhs  Has History of Type 1 DM diagnosed at age 72.  Patient has Type 1 diabetes and needs mealtime Novolog in addition to correction insulin- Every one unit of insulin drops him 50 points- the 0-15 unit correction scale is too aggressive for him.   Please consider adding Novolog 6 units tid with meals- with this-he will always get meal coverage   Custom Novolog correction scale 0-5 units tid and hs      151-200  1 unit      201-250  2 units      251-300  3 units      301-350  4 units      351-400  5 units   -Last saw Dr. Eddie Dibbles with [facility] on 08/30/17 . At that visit, no changes were made to pt's insulin pump settings. They are as follows: Insulin Pump Settings Basal Rates:12am- 0.8 units/hr 9am- 0.9 units/hr Total Basal Insulin per 24 hours period=20.7units  Carbohydrate Ratio:1 unit for every 8Grams of Carbohydrates Correction/Sensitivity Factor:1 unit for every 50mg /dl above Target CBG Target CBG:80-120 mg/dl She did note at that visit that he had been overbolusing **  I would  recommend he not restart his insulin pump until he follows up with Dr. Eddie Dibbles.  There is a 1-800 number on the back of his pump- he should call that number and tell them of his recent hospital admission- they will complete a safety check of the pump with him. I have notified Dr. Eddie Dibbles of his admission.   Text paged MD at 0753am  Spoke to MD by phone at 8:05am- I will meet with patient this am to discuss 1. needs to call insulin pump company for safety check before he puts his pump back on  2. for safety should see Dr. Eddie Dibbles before putting pump back on.   When he puts his pump back on, he must wait 24 hours after Lantus before he puts the pump on to avoid hypoglycemia.   Gentry Fitz, RN, BA, MHA, CDE Diabetes Coordinator Inpatient Diabetes Program  [phone] (Team Pager) [phone] ([place]) 10/29/2017 7:51 AM

## 2017-10-29 NOTE — Consult Note (Signed)
   [facility] CM Inpatient Consult   10/29/2017  MATEEN FRANSSEN May 08, 1946 [id number]   Chart review revealed patient eligible for [facility] services and post hospital discharge follow up related to a diagnosis of Diabetes. Patient was evaluated for community based chronic disease management services with [facility] care Management Program as a benefit of patient's [employer] Medicare. Spoke with patient to explain [facility] services. Patient agreeable to services and  Verbal consent recieved. Patient gave [phone] as the best number to reach him. Patient will receive post hospital discharge calls and be evaluated for monthly home visits. [facility] [place] [facility] services does not interfere with or replace any services arranged by the inpatient care management team. Made inpatient RNCM aware that [facility] will be following for care management. For additional questions please contact:   Maleyah Evans RN, [facility] Liaison  [phone]) Business Mobile [phone]) Toll free office

## 2017-10-29 NOTE — Care Management (Signed)
Telephone order from Dr. Leslye Peer that patient should have been observation status and therefore will need Code 44.  UR updated. RNCM will see patient to explain.

## 2017-10-29 NOTE — Discharge Instructions (Signed)
Occupational Therapy Left wrist brace Home Health Physical Therapy and Occupational Therapy - refused by patient

## 2017-10-29 NOTE — Progress Notes (Signed)
MD order received to discharge pt home with home health PT and OT; Care Management previously advised that the pt refused home health; verbally reviewed AVS with pt, Rxs for out-pt occupational therapy and left wrist splint given to pt; no questions voiced at this time; pt discharged via wheelchair by auxillary to the visitor's entrance

## 2017-10-29 NOTE — Evaluation (Signed)
Occupational Therapy Evaluation Patient Details Name: Gary [name] MRN: [id number] DOB: 03-Dec-1945 Today's Date: 10/29/2017    History of Present Illness Gary [name]  is a 72 y.o. male with a known history of depression, diabetes type 2, gastroparesis, Graves' disease, essential hypertension, mantle cell lymphoma and peripheral neuropathy who is being admitted   Patient states that he has been recently started on a new insulin pump by his endocrinologist.  He states that when he woke up this morning his blood sugar was 400 so he took a bolus of insulin.  He does not recall how much insulin he took.  Patient subsequently had an episode of being unresponsive.EMS was called, the patient was found to have a blood glucose of 30 and was given an amp of D50 EMS notes the blood glucose increased to over 80. Upon arrival to the emergency department the blood glucose is once again decreased to 40 and the patient feels somnolent and lightheaded.   Patient was started on a D5 drip.  Blood sugars now improved.  He does feeling better.  He reports normally his blood sugar is usually under 200.  He denies any chest pain or shortness of breath. Pt was admitted on 10/28/17 with diagnosis: hypoglycemia.   Clinical Impression   Pt admitted as above & was assessed for acute OT following recent fall at home secondary to hypoglycemic event. Since that fall, pt is with noted left wrist drop for which he denies pain. He is limited in coordination and functional activitiy including transfers/ADL's etc. He also has a h/o right humerus fracture 07/2017 which is continuing to heal per pt report and chart review. There are no weight bearing orders in pt chart for R UE or L UE but it currently appears that pt may benefit from bilateral platform walker secondary to h/o falls, RUE shoulder fracture and LUE wrist drop. Pt should also benefit from thumb spica splint L hand to allow for better positioning while he awaits Ortho Consult and  f/u recommendations for out-pt OT/hand therapy.    Follow Up Recommendations  Supervision/Assistance - 24 hour;Outpatient OT(Out-pt OT if deemed appropriate by Ortho MD)    Equipment Recommendations  Other (comment)(Left medium thumb spica splint prior to d/c home, for wrist drop.)    Recommendations for Other Services PT consult;Other (comment)(Othro MD Consult secondary to L wrist drop following fall after hypoglycemic event 10/28/17)     Precautions / Restrictions Precautions Precautions: Fall      Mobility Bed Mobility Overal bed mobility: (Pt sitting up at EOB upon OT arrival )                Transfers Overall transfer level: Needs assistance Equipment used: 1 person hand held assist(1 person bilateral hand held assist) Transfers: Sit to/from [facility] Sit to Stand: Min assist Stand pivot transfers: Min assist       General transfer comment: Pt has fear of falling secondary to h/o falls as well as need for increased balance/support in standing and transfers. He should benefit from bilateral platform RW as he has older shoulder fracture (R 07/2017) and left wrist drop following fall at home after hypoglycemic event. PT is contacting Ortho MD for clarification.    Balance Overall balance assessment: Needs assistance;History of Falls Sitting-balance support: No upper extremity supported;Feet supported Sitting balance-Leahy Scale: Fair     Standing balance support: Bilateral upper extremity supported;During functional activity Standing balance-Leahy Scale: Poor Standing balance comment: Pt with fear a falling, slight  posterior lean, h/o falls at home, usually relies on [facility] but used bilateral hand held assist this date to take short shuffling steps. Pt should benefit from bilateral UE platform RW if PT and MD is agreeable. PT currently clarifying with MD office.                           ADL either performed or assessed with clinical  judgement   ADL Overall ADL's : Needs assistance/impaired;At baseline                                       General ADL Comments: Pt was assessed for acute OT following recent fall at home secondary to hypoglycemic event. Since that fall, pt is with noted left wrist drop for which he denies pain. He is limited in coordination and functional activitiy including transfers/ADL's etc. He also has a h/o right humerus fracture 07/2017 which is continuing to heal per pt report and chart review. There are no weight bearing orders in pt chart for R UE or L UE but it currently appears that pt may benefit from bilateral platform walker secondary to h/o falls, RUE shoulder fracture and LUE wrist drop. Pt should also benefit from thumb spica splint L hand to allow for better positioning while he awaits Ortho Consult and f/u recommendations for out-pt OT/hand therapy.     Vision Baseline Vision/History: Wears glasses Patient Visual Report: No change from baseline       Perception     Praxis      Pertinent Vitals/Pain Pain Assessment: No/denies pain     Hand Dominance     Extremity/Trunk Assessment Upper Extremity Assessment Upper Extremity Assessment: RUE deficits/detail;LUE deficits/detail RUE Deficits / Details: Previous Right humerus fracture Oct 2018. Limited active ROM for shoulder noted. Per last Ortho note dated 10/19/17, pt fracture is begining to heal. No weight bearing precautions in chart RUE Sensation: history of peripheral neuropathy;decreased light touch RUE Coordination: (Overall WFL's) LUE Deficits / Details: Pt with left wrist drop that he states is new since his latest fall. He is unable to actively extend this left wrist/fingers. He has thumb palmar ABD/ADD but no ABD/ADD of thumb in neutral position. Elbow active ROM is WFL's; limited shoulder flexion to ~100* noted. Recommend f/u with Ortho MD to r/o possible radial nerve or other nerve damage following recent fall  that ocurred after hypoglycemic event for which he was admitted., as well as recommendations for possible f/u Out-pt Occupational Therapy/hand therapy. LUE Sensation: history of peripheral neuropathy;decreased light touch LUE Coordination: decreased fine motor;decreased gross motor   Lower Extremity Assessment Lower Extremity Assessment: Defer to PT evaluation       Communication Communication Communication: No difficulties   Cognition Arousal/Alertness: Awake/alert Behavior During Therapy: WFL for tasks assessed/performed Overall Cognitive Status: Within Functional Limits for tasks assessed                                     General Comments       Exercises     Shoulder Instructions      Home Living Family/patient expects to be discharged to:: Private residence Living Arrangements: Spouse/significant other Available Help at Discharge: Family Type of Home: House Home Access: Stairs to enter;Ramped entrance Entrance Stairs-Number of Steps: 2 Entrance  Stairs-Rails: Right Home Layout: One level     Bathroom Shower/Tub: Tub/shower unit;Other (comment)(Pt takes sponge baths secondary to previous falls in bathroom and difficulty stepping in/out of tub)   Bathroom Toilet: Standard     Home Equipment: Walker - 2 wheels          Prior Functioning/Environment Level of Independence: Independent        Comments: pt independent in mobility and ADLs at baseline.        OT Problem List:        OT Treatment/Interventions:      OT Goals(Current goals can be found in the care plan section) Acute Rehab OT Goals Patient Stated Goal: Go home later today OT Goal Formulation: (Acute D/C Orders in chart. Recommend that Pt f/u with Ortho MD secondary to Left wrist drop. MD will recommed f/u/progression as deemed appropriate) Time For Goal Achievement: 11/12/17  OT Frequency:     Barriers to D/C:            Co-evaluation              AM-PAC PT "6  Clicks" Daily Activity     Outcome Measure Help from another person eating meals?: None Help from another person taking care of personal grooming?: A Little Help from another person toileting, which includes using toliet, bedpan, or urinal?: A Little Help from another person bathing (including washing, rinsing, drying)?: A Lot Help from another person to put on and taking off regular upper body clothing?: A Little Help from another person to put on and taking off regular lower body clothing?: A Lot 6 Click Score: 17   End of Session Equipment Utilized During Treatment: Gait belt;Other (comment)(Bilateral Hand held assist) Nurse Communication: Other (comment)(Need for Left medium thumb spica splint to assist with positioning secondary to left wrist drop)  Activity Tolerance: Patient tolerated treatment well Patient left: in bed;with call bell/phone within reach;with bed alarm set;with family/visitor present  OT Visit Diagnosis: Unsteadiness on feet (R26.81);Repeated falls (R29.6)                Time: 1212-1226 OT Time Calculation (min): 14 min Charges:  OT General Charges $OT Visit: 1 Visit OT Evaluation $OT Eval Moderate Complexity: 1 Mod G-Codes:      [place], [employer], OTR/L 10/29/2017, 12:54 PM

## 2017-10-29 NOTE — Care Management (Signed)
RNCM spoke with patient and his wife regarding therapy at home and he has declined. He is ready to go home; RN Updated.

## 2017-10-29 NOTE — Care Management (Signed)
Met with patient that was sitting on side of bed independently and his wife to explain MOON.  He denies any other needs. He plans to return home today.

## 2017-10-31 NOTE — Progress Notes (Signed)
[place]  [phone] [phone]  ID: Gary [name] OB: July 01, 1946  MR#: [id number]  IBB#:[id number]  Patient Care Team: [name], MD as PCP - General (Internal Medicine) Lyman Speller, RN as [facility] [facility] [place], Eppie Gibson, RN as [facility]  CHIEF COMPLAINT: Mantle cell lymphoma.  INTERVAL HISTORY: Patient returns returns clinic today for further evaluation and initiation of cycle 1, day 1 of Rituxan and Treanda.  He had a fall earlier this week secondary to low blood sugar, but currently feels well and is asymptomatic. He denies any fevers, chills, or night sweats. He has a good appetite and denies weight loss. He has no neurologic complaints. He denies any chest pain or shortness of breath. He has no nausea, vomiting, constipation, or diarrhea.  He denies any melena or hematochezia.  He has no urinary complaints. Patient offers no specific complaints today.  REVIEW OF SYSTEMS:   Review of Systems  Constitutional: Negative for diaphoresis, fever, malaise/fatigue and weight loss.  Respiratory: Negative.  Negative for cough and shortness of breath.   Cardiovascular: Negative.  Negative for chest pain and leg swelling.  Gastrointestinal: Negative.  Negative for abdominal pain, blood in stool and melena.  Genitourinary: Negative.  Negative for hematuria.  Musculoskeletal: Positive for falls.  Skin: Negative.  Negative for rash.  Neurological: Negative.  Negative for sensory change and weakness.  Psychiatric/Behavioral: Negative.  The patient is not nervous/anxious.     As per HPI. Otherwise, a complete review of systems is negative.  PAST MEDICAL HISTORY: Past Medical History:  Diagnosis Date  . Depression   . 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 Gastroparesis   . Graves disease   . Hypercholesterolemia   .  Hypertension   . Mantle cell lymphoma ([place])   . Peripheral neuropathy     PAST SURGICAL HISTORY: Past Surgical History:  Procedure Laterality Date  . NEPHRECTOMY     right    FAMILY HISTORY Family History  Problem Relation Age of Onset  . Breast cancer Mother   . Diabetes Father   . Cancer Father        Lymphoma  . Alcohol abuse Maternal Uncle   . Diabetes Sister   . Heart disease Unknown        grandfather       ADVANCED DIRECTIVES:    HEALTH MAINTENANCE: Social History   Tobacco Use  . Smoking status: Former Research scientist (life sciences)  . Smokeless tobacco: Current User    Types: Chew  Substance Use Topics  . Alcohol use: Yes    Alcohol/week: 1.8 oz    Types: 3 Cans of beer per week    Comment: nightly  . Drug use: No     Colonoscopy:  PAP:  Bone density:  Lipid panel:  Allergies  Allergen Reactions  . Rofecoxib Nausea Only    Current Outpatient Medications  Medication Sig Dispense Refill  . ACCU-CHEK AVIVA PLUS test strip USE AS INSTRUCTED. USE 1 STRIP EVERY 3 HOURS . ACCU-CHECK AVIVA PLUS TEST STRIP. DX E10.649  12  . carvedilol (COREG) 3.125 MG tablet Take 3.125 mg by mouth 2 (two) times daily with a meal.     . cyanocobalamin 1000 MCG tablet Take 1,000 mcg by mouth daily.    . ferrous sulfate 325 (65 FE) MG tablet Take 325 mg by mouth daily.    . furosemide (LASIX) 20 MG tablet  Take 1 tablet (20 mg total) by mouth daily. 30 tablet 1  . insulin aspart (NOVOLOG) 100 UNIT/ML injection Basal rate 12am 0.8 units per hour, 9am 0.9 units per hour. (total basal insulin 20.7 units). Carbohydrate ratio 1 units for every 8gm of carbohydrate. Correction factor 1 units for every '50mg'$ /dl over target cbg. Target CBG 80-120 1 vial 12  . levothyroxine (SYNTHROID, LEVOTHROID) 25 MCG tablet Take 25 mcg by mouth daily.    Marland Kitchen lisinopril (PRINIVIL,ZESTRIL) 20 MG tablet Take 20 mg by mouth daily.    . Multiple Vitamin (MULTIVITAMIN) tablet Take 1 tablet by mouth daily.    . naproxen sodium  (ALEVE) 220 MG tablet Take 220 mg by mouth daily as needed.    . pravastatin (PRAVACHOL) 40 MG tablet Take 1 tablet (40 mg total) by mouth at bedtime. Will need office visit for any more refills 30 tablet 0  . acyclovir (ZOVIRAX) 400 MG tablet Take 1 tablet (400 mg total) by mouth daily. (Patient not taking: Reported on 10/28/2017) 30 tablet 3  . ondansetron (ZOFRAN) 8 MG tablet Take 1 tablet (8 mg total) by mouth 2 (two) times daily as needed for refractory nausea / vomiting. (Patient not taking: Reported on 11/01/2017) 30 tablet 2  . pantoprazole (PROTONIX) 40 MG tablet TAKE 1 TABLET (40 MG TOTAL) BY MOUTH DAILY. (Patient not taking: Reported on 10/28/2017) 30 tablet 0  . prochlorperazine (COMPAZINE) 10 MG tablet Take 1 tablet (10 mg total) by mouth every 6 (six) hours as needed (Nausea or vomiting). (Patient not taking: Reported on 11/01/2017) 60 tablet 2   Current Facility-Administered Medications  Medication Dose Route Frequency Provider Last Rate Last Dose  . cyanocobalamin ((VITAMIN B-12)) injection 1,000 mcg  1,000 mcg Intramuscular Once [name], MD      Race: Please given the multiple things to do this would like to do a little bit for myself before do anything  OBJECTIVE: Vitals:   11/02/17 0850  BP: 116/66  Pulse: 86  Resp: 18  Temp: (!) 97.2 F (36.2 C)     Body mass index is 37.57 kg/m.    ECOG FS:0 - Asymptomatic  General: Disheveled appearing, no acute distress. Eyes: Pink conjunctiva, anicteric sclera. Lungs: Clear to auscultation bilaterally. Heart: Regular rate and rhythm. No rubs, murmurs, or gallops. Abdomen: Soft, nontender, nondistended. No organomegaly noted, normoactive bowel sounds. Musculoskeletal: No edema, cyanosis, or clubbing. Neuro: Alert, answering all questions appropriately. Cranial nerves grossly intact. Skin: No rashes or petechiae noted. Psych: Normal affect. Lymphatics: No cervical, calvicular, axillary or inguinal LAD.   LAB  RESULTS:  Lab Results  Component Value Date   NA 139 11/02/2017   K 4.0 11/02/2017   CL 100 (L) 11/02/2017   CO2 31 11/02/2017   GLUCOSE 70 11/02/2017   BUN 14 11/02/2017   CREATININE 0.95 11/02/2017   CALCIUM 8.4 (L) 11/02/2017   PROT 6.9 11/02/2017   ALBUMIN 3.3 (L) 11/02/2017   AST 32 11/02/2017   ALT 18 11/02/2017   ALKPHOS 196 (H) 11/02/2017   BILITOT 0.9 11/02/2017   GFRNONAA >60 11/02/2017   GFRAA >60 11/02/2017    Lab Results  Component Value Date   WBC 6.7 11/02/2017   NEUTROABS 4.7 11/02/2017   HGB 12.4 (L) 11/02/2017   HCT 38.4 (L) 11/02/2017   MCV 87.7 11/02/2017   PLT 154 11/02/2017   Lab Results  Component Value Date   IRON 13 (L) 08/11/2017   TIBC 223 (L) 01/12/2017   IRONPCTSAT 3.8 (  L) 08/11/2017   Lab Results  Component Value Date   FERRITIN 42.0 08/11/2017     STUDIES: Ct Head Wo Contrast  Result Date: 10/28/2017 CLINICAL DATA:  Head trauma, ataxia EXAM: CT HEAD WITHOUT CONTRAST CT CERVICAL SPINE WITHOUT CONTRAST TECHNIQUE: Multidetector CT imaging of the head and cervical spine was performed following the standard protocol without intravenous contrast. Multiplanar CT image reconstructions of the cervical spine were also generated. COMPARISON:  07/28/2017 FINDINGS: CT HEAD FINDINGS Brain: No evidence of acute infarction, hemorrhage, extra-axial collection, ventriculomegaly, or mass effect. Generalized cerebral atrophy. Periventricular white matter low attenuation likely secondary to microangiopathy. Vascular: Cerebrovascular atherosclerotic calcifications are noted. Skull: Negative for fracture or focal lesion. Sinuses/Orbits: Visualized portions of the orbits are unremarkable. Visualized portions of the paranasal sinuses and mastoid air cells are unremarkable. Other: None. CT CERVICAL SPINE FINDINGS Alignment: Normal. Skull base and vertebrae: No acute fracture. No primary bone lesion or focal pathologic process. Soft tissues and spinal canal: No  prevertebral fluid or swelling. No visible canal hematoma. Disc levels: Degenerative disc disease with disc height loss at C3-4 and C6-7. Bilateral uncovertebral degenerative changes at C3-4 and mild left facet arthropathy. Mild bilateral facet arthropathy at C4-5 and C5-6. Upper chest: Lung apices are clear. Other: Small amount air in the right jugular vein likely iatrogenic. IMPRESSION: 1. No acute intracranial pathology. 2.  No acute osseous injury of the cervical spine. Electronically Signed   By: Kathreen Devoid   On: 10/28/2017 12:28   Ct Chest W Contrast  Result Date: 10/19/2017 CLINICAL DATA:  Low-grade mantle cell lymphoma diagnosed September 2015. Interval observation. EXAM: CT CHEST, ABDOMEN, AND PELVIS WITH CONTRAST TECHNIQUE: Multidetector CT imaging of the chest, abdomen and pelvis was performed following the standard protocol during bolus administration of intravenous contrast. CONTRAST:  13m ISOVUE-300 IOPAMIDOL (ISOVUE-300) INJECTION 61% COMPARISON:  03/17/2017 CT chest, abdomen and pelvis. FINDINGS: CT CHEST FINDINGS Cardiovascular: Normal heart size. No significant pericardial fluid/thickening. Left anterior descending and right coronary atherosclerosis. Normal course and caliber of the thoracic aorta. Main pulmonary artery diameter 3.1 cm, top-normal, stable. No central pulmonary emboli. Mediastinum/Nodes: No discrete thyroid nodules. Unremarkable esophagus. Increased bilateral axillary/retropectoral adenopathy. Representative enlarged 1.6 cm right axillary node (series 2/image 22), previously 1.0 cm, increased. Representative enlarged 1.3 cm left retropectoral node (series 2/image 19), previously 0.9 cm, increased. Newly mildly enlarged 0.8 cm left internal mammary node (series 2/image 30). Mildly enlarged right paratracheal nodes up to 1.3 cm (series 2/image 27), previously 1.1 cm, mildly increased. Enlarged 1.7 cm subcarinal node (series 2/image 34), previously 1.6 cm, slightly increased.  Enlarged 1.9 cm right hilar node (series 2/image 30), previously 1.5 cm, increased. Newly enlarged 1.1 cm left hilar node (series 2/image 34). Lungs/Pleura: No pneumothorax. Small dependent bilateral pleural effusions, right greater than left. Mild hypoventilatory changes in the dependent lower lobes. Mild paraseptal emphysema. No acute consolidative airspace disease or lung masses. Two solid pulmonary nodules in the upper lobes, largest 6 mm in the medial left upper lobe (series 4/image 52), both stable. No new significant pulmonary nodules. Musculoskeletal: No aggressive appearing focal osseous lesions. Incompletely healed comminuted proximal right humerus fracture, as originally diagnosed on 08/03/2017 right shoulder radiographs. Mild thoracic spondylosis. CT ABDOMEN PELVIS FINDINGS Hepatobiliary: Normal liver size. No liver mass. Cholecystectomy. No biliary ductal dilatation. Pancreas: Normal, with no mass or duct dilation. Spleen: Normal size. No mass. Adrenals/Urinary Tract: Normal adrenals. Right nephrectomy. No mass in the right nephrectomy bed. Simple exophytic 1.2 cm lower left renal cyst.  No additional left renal lesions. No left hydronephrosis. Normal bladder. Stomach/Bowel: Normal non-distended stomach. Normal caliber small bowel with no small bowel wall thickening. Normal appendix. Oral contrast transits to the left colon. Normal large bowel with no diverticulosis, large bowel wall thickening or pericolonic fat stranding. Vascular/Lymphatic: Atherosclerotic nonaneurysmal abdominal aorta. Patent portal, splenic and left renal veins. Left para-aortic adenopathy measures up to 2.7 cm (series 2/image 80), previously 1.8 cm, increased. Right paracaval adenopathy measures up to 1.8 cm (series 2/image 92), previously 1.4 cm, increased. Bulky bilateral common iliac, external iliac and inguinal adenopathy is increased. Representative 4.8 cm left common iliac node, previously 2.5 cm, increased. Representative  3.9 cm left external iliac node (series 2/image 107), previously 2.9 cm, increased. Representative 2.8 cm right inguinal node (series 2/image 134), previously 2.1 cm, increased. Reproductive: Top-normal size prostate. Other: No pneumoperitoneum, ascites or focal fluid collection. Mild anasarca is new. Musculoskeletal: No aggressive appearing focal osseous lesions. Lumbar spondylosis. IMPRESSION: 1. Clear progression of lymphoma. Bilateral axillary, mediastinal, bilateral hilar, retroperitoneal and bulky bilateral pelvic adenopathy is all increased since 03/17/2017 CT. 2. Evidence of fluid third-spacing with small dependent bilateral pleural effusions and mild anasarca. 3. Comminuted proximal right humerus fracture is incompletely healed. 4. Chronic findings include: Aortic Atherosclerosis (ICD10-I70.0) and Emphysema (ICD10-J43.9). Coronary atherosclerosis. Electronically Signed   By: Ilona Sorrel M.D.   On: 10/19/2017 13:43   Ct Cervical Spine Wo Contrast  Result Date: 10/28/2017 CLINICAL DATA:  Head trauma, ataxia EXAM: CT HEAD WITHOUT CONTRAST CT CERVICAL SPINE WITHOUT CONTRAST TECHNIQUE: Multidetector CT imaging of the head and cervical spine was performed following the standard protocol without intravenous contrast. Multiplanar CT image reconstructions of the cervical spine were also generated. COMPARISON:  07/28/2017 FINDINGS: CT HEAD FINDINGS Brain: No evidence of acute infarction, hemorrhage, extra-axial collection, ventriculomegaly, or mass effect. Generalized cerebral atrophy. Periventricular white matter low attenuation likely secondary to microangiopathy. Vascular: Cerebrovascular atherosclerotic calcifications are noted. Skull: Negative for fracture or focal lesion. Sinuses/Orbits: Visualized portions of the orbits are unremarkable. Visualized portions of the paranasal sinuses and mastoid air cells are unremarkable. Other: None. CT CERVICAL SPINE FINDINGS Alignment: Normal. Skull base and vertebrae:  No acute fracture. No primary bone lesion or focal pathologic process. Soft tissues and spinal canal: No prevertebral fluid or swelling. No visible canal hematoma. Disc levels: Degenerative disc disease with disc height loss at C3-4 and C6-7. Bilateral uncovertebral degenerative changes at C3-4 and mild left facet arthropathy. Mild bilateral facet arthropathy at C4-5 and C5-6. Upper chest: Lung apices are clear. Other: Small amount air in the right jugular vein likely iatrogenic. IMPRESSION: 1. No acute intracranial pathology. 2.  No acute osseous injury of the cervical spine. Electronically Signed   By: Kathreen Devoid   On: 10/28/2017 12:28   Ct Abdomen Pelvis W Contrast  Result Date: 10/19/2017 CLINICAL DATA:  Low-grade mantle cell lymphoma diagnosed September 2015. Interval observation. EXAM: CT CHEST, ABDOMEN, AND PELVIS WITH CONTRAST TECHNIQUE: Multidetector CT imaging of the chest, abdomen and pelvis was performed following the standard protocol during bolus administration of intravenous contrast. CONTRAST:  134m ISOVUE-300 IOPAMIDOL (ISOVUE-300) INJECTION 61% COMPARISON:  03/17/2017 CT chest, abdomen and pelvis. FINDINGS: CT CHEST FINDINGS Cardiovascular: Normal heart size. No significant pericardial fluid/thickening. Left anterior descending and right coronary atherosclerosis. Normal course and caliber of the thoracic aorta. Main pulmonary artery diameter 3.1 cm, top-normal, stable. No central pulmonary emboli. Mediastinum/Nodes: No discrete thyroid nodules. Unremarkable esophagus. Increased bilateral axillary/retropectoral adenopathy. Representative enlarged 1.6 cm right axillary node (  series 2/image 22), previously 1.0 cm, increased. Representative enlarged 1.3 cm left retropectoral node (series 2/image 19), previously 0.9 cm, increased. Newly mildly enlarged 0.8 cm left internal mammary node (series 2/image 30). Mildly enlarged right paratracheal nodes up to 1.3 cm (series 2/image 27), previously 1.1  cm, mildly increased. Enlarged 1.7 cm subcarinal node (series 2/image 34), previously 1.6 cm, slightly increased. Enlarged 1.9 cm right hilar node (series 2/image 30), previously 1.5 cm, increased. Newly enlarged 1.1 cm left hilar node (series 2/image 34). Lungs/Pleura: No pneumothorax. Small dependent bilateral pleural effusions, right greater than left. Mild hypoventilatory changes in the dependent lower lobes. Mild paraseptal emphysema. No acute consolidative airspace disease or lung masses. Two solid pulmonary nodules in the upper lobes, largest 6 mm in the medial left upper lobe (series 4/image 52), both stable. No new significant pulmonary nodules. Musculoskeletal: No aggressive appearing focal osseous lesions. Incompletely healed comminuted proximal right humerus fracture, as originally diagnosed on 08/03/2017 right shoulder radiographs. Mild thoracic spondylosis. CT ABDOMEN PELVIS FINDINGS Hepatobiliary: Normal liver size. No liver mass. Cholecystectomy. No biliary ductal dilatation. Pancreas: Normal, with no mass or duct dilation. Spleen: Normal size. No mass. Adrenals/Urinary Tract: Normal adrenals. Right nephrectomy. No mass in the right nephrectomy bed. Simple exophytic 1.2 cm lower left renal cyst. No additional left renal lesions. No left hydronephrosis. Normal bladder. Stomach/Bowel: Normal non-distended stomach. Normal caliber small bowel with no small bowel wall thickening. Normal appendix. Oral contrast transits to the left colon. Normal large bowel with no diverticulosis, large bowel wall thickening or pericolonic fat stranding. Vascular/Lymphatic: Atherosclerotic nonaneurysmal abdominal aorta. Patent portal, splenic and left renal veins. Left para-aortic adenopathy measures up to 2.7 cm (series 2/image 80), previously 1.8 cm, increased. Right paracaval adenopathy measures up to 1.8 cm (series 2/image 92), previously 1.4 cm, increased. Bulky bilateral common iliac, external iliac and inguinal  adenopathy is increased. Representative 4.8 cm left common iliac node, previously 2.5 cm, increased. Representative 3.9 cm left external iliac node (series 2/image 107), previously 2.9 cm, increased. Representative 2.8 cm right inguinal node (series 2/image 134), previously 2.1 cm, increased. Reproductive: Top-normal size prostate. Other: No pneumoperitoneum, ascites or focal fluid collection. Mild anasarca is new. Musculoskeletal: No aggressive appearing focal osseous lesions. Lumbar spondylosis. IMPRESSION: 1. Clear progression of lymphoma. Bilateral axillary, mediastinal, bilateral hilar, retroperitoneal and bulky bilateral pelvic adenopathy is all increased since 03/17/2017 CT. 2. Evidence of fluid third-spacing with small dependent bilateral pleural effusions and mild anasarca. 3. Comminuted proximal right humerus fracture is incompletely healed. 4. Chronic findings include: Aortic Atherosclerosis (ICD10-I70.0) and Emphysema (ICD10-J43.9). Coronary atherosclerosis. Electronically Signed   By: Ilona Sorrel M.D.   On: 10/19/2017 13:43    ASSESSMENT: Low-grade mantle cell lymphoma diagnosed in September 2015, iron deficiency anemia.  PLAN:    1. Mantle cell lymphoma, stage III: Patient's initial diagnosis was on inguinal lymph node biopsy on July 12, 2014.  CT scan results from January 2019 reviewed independently and reported as above with clear evidence of progression of disease.  Patient declined bone marrow biopsy to complete the staging workup.  Although, this would not change his overall treatment plan.  Proceed with cycle 1, day 1 of Rituxan and Treanda.  Patient will receive Rituxan on day 1 with Treanda on days 1 and 2 every 28 days for 4-6 cycles.  Return to clinic in 1 week for laboratory work and assess her toleration of treatment and then in 4 weeks for consideration of cycle 2.   2. Decreased ejection fraction:  Previously patient had an ejection fraction of 40% with no clinical CHF.  Monitor. 3. Diabetes: Continue insulin pump as directed. 4. Thrombocytopenia: Resolved. 5. Hypertension: Continue treatment and evaluation per PCP. 6.  Iron deficiency anemia: Patient's hemoglobin has improved and is now within normal limits.  His iron stores continue to be decreased.  Patient last received IV Feraheme on September 28, 2017.  Will consider additional IV Feraheme along with his treatment for mantle cell as above.     Patient expressed understanding and was in agreement with this plan. He also understands that He can call clinic at any time with any questions, concerns, or complaints.    Lloyd Huger, MD   11/02/2017 9:51 AM

## 2017-11-01 ENCOUNTER — Other Ambulatory Visit: Payer: Self-pay | Admitting: *Deleted

## 2017-11-01 ENCOUNTER — Telehealth: Payer: Self-pay | Admitting: *Deleted

## 2017-11-01 NOTE — Telephone Encounter (Signed)
Copied from [facility] [phone]. Topic: Appointment Scheduling - Scheduling Inquiry for Clinic >> Oct 29, 2017 12:18 PM [place], Laban Emperor wrote: Reason for CRM: [facility]. Calling for a week hosp f/u appt with Dr. Nicki Reaper, pt is being discharge today. Dr. Nicki Reaper does not have anything for the next two weeks. Please f/u with pt to see if he can be worked in.

## 2017-11-01 NOTE — Patient Outreach (Signed)
11/01/2017  Gary Stager. [name]  10-11-1946 [id number]  Successful telephone encounter to Gary [name], 72 year male- follow up on referral received  10/29/17 from  [facility] hospital liaison for  Community CM services following recent  Observation stay January 17-18,2019 for Hypoglycemia, fall, Vitamin B 12 deficiency.   Spoke with pt, HIPAA identifiers verified, discussed purpose of call- follow up on referral/ Recent observation stay.   Pt reports doing fair, knows reason for recent observation stay-  [place] as a result of sugar dropping (on insulin pump, gave himself too much insulin).  Pt  Reports  he knows how to use his insulin pump, went over it in the hospital.  Pt reports  His sugars have been down in the normal range since discharge home, was 150 this am.  Follow up MD appointments:  Per pt has appointments to follow up with PCP and     Orthopedic MD (Left wrist drop from fall), does not know the dates, wife provides     Transportation.     Medications:  Pt reports taking all of his medications.  Pt gave permission for RN CM to     Speak with spouse Gary [name]  about discharge medications, medications reviewed with spouse-    Pt taking  per discharge instructions with exception of Acyclovir, Ondansetron,      Prochlorperazine as pt to start first chemo treatment tomorrow for Lymphoma, to      Ask Dr. [name] when to start these medications.    RN CM discussed with pt [facility] Community CM services, scheduling a home visit to which    Pt declined, does not want someone coming into this home, to follow up with his  doctors.     RN CM then  discussed with pt doing a  referral for [facility] to follow up    Telephonically for  Diabetes disease management to which pt was agreeable.    Plan:  RN CM to notify [facility] administrative assistant of case status- pt declining Community      CM services.             RN CM to send referral to [facility] to follow pt with  Diabetes DM services.    Gary [name].   [facility]  [phone]

## 2017-11-02 ENCOUNTER — Encounter (INDEPENDENT_AMBULATORY_CARE_PROVIDER_SITE_OTHER): Payer: Self-pay

## 2017-11-02 ENCOUNTER — Inpatient Hospital Stay (HOSPITAL_BASED_OUTPATIENT_CLINIC_OR_DEPARTMENT_OTHER): Payer: Medicare Other | Admitting: Oncology

## 2017-11-02 ENCOUNTER — Other Ambulatory Visit (INDEPENDENT_AMBULATORY_CARE_PROVIDER_SITE_OTHER): Payer: Self-pay | Admitting: Vascular Surgery

## 2017-11-02 ENCOUNTER — Inpatient Hospital Stay: Payer: Medicare Other

## 2017-11-02 ENCOUNTER — Encounter: Payer: Self-pay | Admitting: *Deleted

## 2017-11-02 VITALS — BP 116/66 | HR 86 | Temp 97.2°F | Resp 18 | Wt 254.4 lb

## 2017-11-02 DIAGNOSIS — I2510 Atherosclerotic heart disease of native coronary artery without angina pectoris: Secondary | ICD-10-CM

## 2017-11-02 DIAGNOSIS — Z9181 History of falling: Secondary | ICD-10-CM

## 2017-11-02 DIAGNOSIS — C8310 Mantle cell lymphoma, unspecified site: Secondary | ICD-10-CM | POA: Diagnosis not present

## 2017-11-02 DIAGNOSIS — Z79899 Other long term (current) drug therapy: Secondary | ICD-10-CM

## 2017-11-02 DIAGNOSIS — D509 Iron deficiency anemia, unspecified: Secondary | ICD-10-CM

## 2017-11-02 DIAGNOSIS — Z5111 Encounter for antineoplastic chemotherapy: Secondary | ICD-10-CM | POA: Diagnosis not present

## 2017-11-02 DIAGNOSIS — Z794 Long term (current) use of insulin: Secondary | ICD-10-CM | POA: Diagnosis not present

## 2017-11-02 DIAGNOSIS — Z905 Acquired absence of kidney: Secondary | ICD-10-CM

## 2017-11-02 DIAGNOSIS — E11649 Type 2 diabetes mellitus with hypoglycemia without coma: Secondary | ICD-10-CM

## 2017-11-02 DIAGNOSIS — I10 Essential (primary) hypertension: Secondary | ICD-10-CM

## 2017-11-02 DIAGNOSIS — E0500 Thyrotoxicosis with diffuse goiter without thyrotoxic crisis or storm: Secondary | ICD-10-CM | POA: Diagnosis not present

## 2017-11-02 DIAGNOSIS — E1143 Type 2 diabetes mellitus with diabetic autonomic (poly)neuropathy: Secondary | ICD-10-CM | POA: Diagnosis not present

## 2017-11-02 DIAGNOSIS — C8318 Mantle cell lymphoma, lymph nodes of multiple sites: Secondary | ICD-10-CM

## 2017-11-02 DIAGNOSIS — Z87891 Personal history of nicotine dependence: Secondary | ICD-10-CM

## 2017-11-02 DIAGNOSIS — E11311 Type 2 diabetes mellitus with unspecified diabetic retinopathy with macular edema: Secondary | ICD-10-CM | POA: Diagnosis not present

## 2017-11-02 DIAGNOSIS — E7800 Pure hypercholesterolemia, unspecified: Secondary | ICD-10-CM

## 2017-11-02 DIAGNOSIS — Z807 Family history of other malignant neoplasms of lymphoid, hematopoietic and related tissues: Secondary | ICD-10-CM

## 2017-11-02 DIAGNOSIS — Z803 Family history of malignant neoplasm of breast: Secondary | ICD-10-CM

## 2017-11-02 DIAGNOSIS — Z7982 Long term (current) use of aspirin: Secondary | ICD-10-CM | POA: Diagnosis not present

## 2017-11-02 LAB — COMPREHENSIVE METABOLIC PANEL
ALBUMIN: 3.3 g/dL — AB (ref 3.5–5.0)
ALT: 18 U/L (ref 17–63)
AST: 32 U/L (ref 15–41)
Alkaline Phosphatase: 196 U/L — ABNORMAL HIGH (ref 38–126)
Anion gap: 8 (ref 5–15)
BUN: 14 mg/dL (ref 6–20)
CHLORIDE: 100 mmol/L — AB (ref 101–111)
CO2: 31 mmol/L (ref 22–32)
CREATININE: 0.95 mg/dL (ref 0.61–1.24)
Calcium: 8.4 mg/dL — ABNORMAL LOW (ref 8.9–10.3)
GFR calc Af Amer: >60 mL/min (ref >60)
GFR calc non Af Amer: >60 mL/min (ref >60)
GLUCOSE: 70 mg/dL (ref 65–99)
POTASSIUM: 4 mmol/L (ref 3.5–5.1)
SODIUM: 139 mmol/L (ref 135–145)
Total Bilirubin: 0.9 mg/dL (ref 0.3–1.2)
Total Protein: 6.9 g/dL (ref 6.5–8.1)

## 2017-11-02 LAB — CBC WITH DIFFERENTIAL/PLATELET
Basophils Absolute: 0 10*3/uL (ref 0–0.1)
Basophils Relative: 1 %
EOS ABS: 0.3 10*3/uL (ref 0–0.7)
EOS PCT: 5 %
HCT: 38.4 % — ABNORMAL LOW (ref 40.0–52.0)
Hemoglobin: 12.4 g/dL — ABNORMAL LOW (ref 13.0–18.0)
LYMPHS ABS: 0.8 10*3/uL — AB (ref 1.0–3.6)
LYMPHS PCT: 12 %
MCH: 28.4 pg (ref 26.0–34.0)
MCHC: 32.3 g/dL (ref 32.0–36.0)
MCV: 87.7 fL (ref 80.0–100.0)
MONO ABS: 0.8 10*3/uL (ref 0.2–1.0)
MONOS PCT: 12 %
Neutro Abs: 4.7 10*3/uL (ref 1.4–6.5)
Neutrophils Relative %: 70 %
PLATELETS: 154 10*3/uL (ref 150–440)
RBC: 4.38 MIL/uL — AB (ref 4.40–5.90)
RDW: 20.6 % — ABNORMAL HIGH (ref 11.5–14.5)
WBC: 6.7 10*3/uL (ref 3.8–10.6)

## 2017-11-02 MED ORDER — CEFAZOLIN SODIUM-DEXTROSE 2-4 GM/100ML-% IV SOLN
2.0000 g | Freq: Once | INTRAVENOUS | Status: AC
  Administered 2017-11-03: 2 g via INTRAVENOUS

## 2017-11-02 NOTE — Progress Notes (Signed)
Patient is here today for a new start first treatment. He is stumbling today but wife reports low blood sugar. He is in a wheelchair and also has a cane. He is doing well no major complaints.

## 2017-11-02 NOTE — Telephone Encounter (Signed)
Transition Care Management Follow-up Telephone Call  How have you been since you were released from the hospital? Patient states he feels better but weak and his arm hurts from the fall on left side.   Do you understand why you were in the hospital? yes   Do you understand the discharge instrcutions? yes  Items Reviewed:  Medications reviewed: yes  Allergies reviewed: yes  Dietary changes reviewed: yes  Referrals reviewed: yes   Functional Questionnaire:   Activities of Daily Living (ADLs):   He states they are independent in the following: ambulation, bathing and hygiene, feeding, continence, grooming, toileting and dressing States they require assistance with the following: No assistance needed at his time   Any transportation issues/concerns?: no   Any patient concerns? no   Confirmed importance and date/time of follow-up visits scheduled: yes   Confirmed with patient if condition begins to worsen call PCP or go to the ER.  Patient was given the Call-a-Nurse line [phone]: yes

## 2017-11-02 NOTE — Telephone Encounter (Signed)
First attempt made for TCM on 11/01/17 second attempt made on 11/02/17. Left message for patient to return call to office.

## 2017-11-03 ENCOUNTER — Ambulatory Visit (INDEPENDENT_AMBULATORY_CARE_PROVIDER_SITE_OTHER): Payer: Medicare Other | Admitting: Vascular Surgery

## 2017-11-03 ENCOUNTER — Encounter
Admission: RE | Disposition: A | Discharge status: Home / Self Care | Payer: Self-pay | Source: Ambulatory Visit | Attending: Vascular Surgery

## 2017-11-03 ENCOUNTER — Inpatient Hospital Stay: Payer: Medicare Other

## 2017-11-03 ENCOUNTER — Encounter: Payer: Self-pay | Admitting: *Deleted

## 2017-11-03 ENCOUNTER — Ambulatory Visit
Admission: RE | Admit: 2017-11-03 | Discharge: 2017-11-03 | Disposition: A | Discharge status: Home / Self Care | Payer: Medicare Other | Source: Ambulatory Visit | Attending: Vascular Surgery | Admitting: Vascular Surgery

## 2017-11-03 ENCOUNTER — Encounter (INDEPENDENT_AMBULATORY_CARE_PROVIDER_SITE_OTHER): Payer: Self-pay | Admitting: Vascular Surgery

## 2017-11-03 VITALS — BP 120/69 | HR 101 | Resp 19 | Ht 70.0 in | Wt 255.0 lb

## 2017-11-03 DIAGNOSIS — E0500 Thyrotoxicosis with diffuse goiter without thyrotoxic crisis or storm: Secondary | ICD-10-CM | POA: Insufficient documentation

## 2017-11-03 DIAGNOSIS — Z886 Allergy status to analgesic agent status: Secondary | ICD-10-CM | POA: Diagnosis not present

## 2017-11-03 DIAGNOSIS — Z794 Long term (current) use of insulin: Secondary | ICD-10-CM | POA: Diagnosis not present

## 2017-11-03 DIAGNOSIS — E11311 Type 2 diabetes mellitus with unspecified diabetic retinopathy with macular edema: Secondary | ICD-10-CM | POA: Insufficient documentation

## 2017-11-03 DIAGNOSIS — D500 Iron deficiency anemia secondary to blood loss (chronic): Secondary | ICD-10-CM | POA: Insufficient documentation

## 2017-11-03 DIAGNOSIS — I10 Essential (primary) hypertension: Secondary | ICD-10-CM | POA: Insufficient documentation

## 2017-11-03 DIAGNOSIS — E7800 Pure hypercholesterolemia, unspecified: Secondary | ICD-10-CM | POA: Diagnosis not present

## 2017-11-03 DIAGNOSIS — Z8249 Family history of ischemic heart disease and other diseases of the circulatory system: Secondary | ICD-10-CM | POA: Diagnosis not present

## 2017-11-03 DIAGNOSIS — Z833 Family history of diabetes mellitus: Secondary | ICD-10-CM | POA: Insufficient documentation

## 2017-11-03 DIAGNOSIS — Z807 Family history of other malignant neoplasms of lymphoid, hematopoietic and related tissues: Secondary | ICD-10-CM | POA: Diagnosis not present

## 2017-11-03 DIAGNOSIS — Z803 Family history of malignant neoplasm of breast: Secondary | ICD-10-CM | POA: Diagnosis not present

## 2017-11-03 DIAGNOSIS — Z87891 Personal history of nicotine dependence: Secondary | ICD-10-CM | POA: Insufficient documentation

## 2017-11-03 DIAGNOSIS — I890 Lymphedema, not elsewhere classified: Secondary | ICD-10-CM | POA: Diagnosis not present

## 2017-11-03 DIAGNOSIS — C8310 Mantle cell lymphoma, unspecified site: Secondary | ICD-10-CM | POA: Diagnosis not present

## 2017-11-03 DIAGNOSIS — E1142 Type 2 diabetes mellitus with diabetic polyneuropathy: Secondary | ICD-10-CM | POA: Diagnosis not present

## 2017-11-03 DIAGNOSIS — Z905 Acquired absence of kidney: Secondary | ICD-10-CM | POA: Diagnosis not present

## 2017-11-03 DIAGNOSIS — Z811 Family history of alcohol abuse and dependence: Secondary | ICD-10-CM | POA: Insufficient documentation

## 2017-11-03 DIAGNOSIS — Z79899 Other long term (current) drug therapy: Secondary | ICD-10-CM | POA: Diagnosis not present

## 2017-11-03 DIAGNOSIS — C8318 Mantle cell lymphoma, lymph nodes of multiple sites: Secondary | ICD-10-CM | POA: Insufficient documentation

## 2017-11-03 HISTORY — PX: PORTA CATH INSERTION: CATH118285

## 2017-11-03 LAB — GLUCOSE, CAPILLARY
GLUCOSE-CAPILLARY: 58 mg/dL — AB (ref 65–99)
Glucose-Capillary: 77 mg/dL (ref 65–99)

## 2017-11-03 SURGERY — PORTA CATH INSERTION
Anesthesia: Moderate Sedation

## 2017-11-03 MED ORDER — SODIUM CHLORIDE 0.9 % IV SOLN
INTRAVENOUS | Status: DC
  Administered 2017-11-03: 13:00:00 via INTRAVENOUS

## 2017-11-03 MED ORDER — ONDANSETRON HCL 4 MG/2ML IJ SOLN: 4.0000 mg | Freq: Four times a day (QID) | INTRAMUSCULAR | Status: DC | PRN

## 2017-11-03 MED ORDER — DEXTROSE 50 % IV SOLN
25.0000 g | Freq: Once | INTRAVENOUS | Status: AC
  Administered 2017-11-03: 25 g via INTRAVENOUS

## 2017-11-03 MED ORDER — HYDROMORPHONE HCL 1 MG/ML IJ SOLN: 1.0000 mg | Freq: Once | INTRAMUSCULAR | Status: DC | PRN

## 2017-11-03 MED ORDER — LIDOCAINE-EPINEPHRINE (PF) 1 %-1:200000 IJ SOLN
INTRAMUSCULAR | Status: AC
  Filled 2017-11-03: qty 30

## 2017-11-03 MED ORDER — MIDAZOLAM HCL 2 MG/2ML IJ SOLN
INTRAMUSCULAR | Status: DC | PRN
  Administered 2017-11-03: 2 mg via INTRAVENOUS
  Administered 2017-11-03: 0.5 mg via INTRAVENOUS

## 2017-11-03 MED ORDER — FENTANYL CITRATE (PF) 100 MCG/2ML IJ SOLN
INTRAMUSCULAR | Status: AC
  Filled 2017-11-03: qty 2

## 2017-11-03 MED ORDER — MIDAZOLAM HCL 5 MG/5ML IJ SOLN
INTRAMUSCULAR | Status: AC
  Filled 2017-11-03: qty 5

## 2017-11-03 MED ORDER — DEXTROSE 50 % IV SOLN
INTRAVENOUS | Status: AC
  Filled 2017-11-03: qty 50

## 2017-11-03 MED ORDER — SODIUM CHLORIDE 0.9 % IR SOLN
Freq: Once | Status: DC
  Filled 2017-11-03: qty 2

## 2017-11-03 MED ORDER — FENTANYL CITRATE (PF) 100 MCG/2ML IJ SOLN
INTRAMUSCULAR | Status: DC | PRN
  Administered 2017-11-03: 50 ug via INTRAVENOUS
  Administered 2017-11-03: 25 ug via INTRAVENOUS

## 2017-11-03 SURGICAL SUPPLY — 10 items
DRAPE INCISE IOBAN 66X45 STRL (DRAPES) ×2 IMPLANT
KIT PORT POWER 8FR ISP CVUE (Miscellaneous) ×2 IMPLANT
NDL ENTRY 21GA 7CM ECHOTIP (NEEDLE) IMPLANT
NEEDLE ENTRY 21GA 7CM ECHOTIP (NEEDLE) ×3 IMPLANT
PACK ANGIOGRAPHY (CUSTOM PROCEDURE TRAY) ×3 IMPLANT
SET INTRO CAPELLA COAXIAL (SET/KITS/TRAYS/PACK) ×2 IMPLANT
SHEATH PEEL AWAY 10FRX38 (SHEATH) ×2 IMPLANT
SPONGE XRAY 4X4 16PLY STRL (MISCELLANEOUS) ×2 IMPLANT
SUT MNCRL AB 4-0 PS2 18 (SUTURE) ×3 IMPLANT
SUTURE VIC 3-0 (SUTURE) ×3 IMPLANT

## 2017-11-03 NOTE — H&P (Signed)
[facility] History & Physical Update  The patient was interviewed and re-examined.  The patient's previous History and Physical has been reviewed and is unchanged.  There is no change in the plan of care. We plan to proceed with the scheduled procedure.  Leotis Pain, MD  11/03/2017, 11:46 AM

## 2017-11-03 NOTE — Op Note (Signed)
OPERATIVE NOTE   PROCEDURE: 1. Placement of a right IJ Infuse-a-Port  PRE-OPERATIVE DIAGNOSIS: Lymphoma stage III  POST-OPERATIVE DIAGNOSIS: Same SURGEON: Katha Cabal M.D.  ANESTHESIA: Conscious sedation was administered under my direct supervision by the interventional radiology RN. IV Versed plus fentanyl were utilized. Continuous ECG, pulse oximetry and blood pressure was monitored throughout the entire procedure. Conscious sedation was for a total of 31 minutes.  ESTIMATED BLOOD LOSS: Minimal   FINDING(S): 1.  Patent right IJ vein  SPECIMEN(S): None  INDICATIONS:   Gary [name] is a 72 y.o. male who presents with stage III mantle cell lymphoma.  He will require chemotherapy and therefore requires appropriate intravenous access.  Infuse-a-Port is being placed.  Risks and benefits of been reviewed the patient agrees to proceed.    DESCRIPTION: After obtaining full informed written consent, the patient was brought back to the special procedure suite and placed in the supine position. The patient's right neck and chest wall are prepped and draped in sterile fashion. Appropriate timeout was called.  Ultrasound is placed in a sterile sleeve, ultrasound is utilized to avoid vascular injury as well as secondary to lack of appropriate landmarks. The right internal jugular vein is identified. It is echolucent and homogeneous as well as easily compressible indicating patency. An image is recorded for the permanent record.  Access to the vein with a micropuncture needle is done under direct ultrasound visualization.  1% lidocaine is infiltrated into the soft tissue at the base of the neck as well as on the chest wall.  Under direct ultrasound visualization a micro-needle is inserted into the vein followed by the micro-wire. Micro-sheath was then advanced and a J wire is inserted without difficulty under fluoroscopic guidance. A small counterincision was created at the wire insertion site. A  transverse incision is created 2 fingerbreadths below the scapula and a pocket is fashioned using both blunt and sharp dissection. The pocket is tested for appropriate size with the hub of the Infuse-a-Port. The tunneling device is then used to pull the intravascular portion of the catheter from the pocket to the neck counterincision.  Dilator and peel-away sheath were then inserted over the wire and the wire is removed. Catheter is then advanced into the venous system without difficulty. Peel-away sheath was then removed.  Catheter is then positioned under fluoroscopic guidance at the atrial caval junction. It is then transected connected to the hub and the hope is slipped into the subcutaneous pocket on the chest wall. The hub was then accessed percutaneously and aspirates easily and flushes well and is flushed with 30 cc of heparinized saline. The pocket incision is then closed in layers using interrupted 3-0 Vicryl for the subcutaneous tissues and 4-0 Monocryl subcuticular for skin closure. Dermabond is applied. The neck counterincision was closed with 4-0 Monocryl subcuticular and Dermabond as well.  The patient tolerated the procedure well and there were no immediate complications.  COMPLICATIONS: None  CONDITION: Unchanged  Katha Cabal M.D. [facility] Office: [phone]   11/03/2017, 2:55 PM

## 2017-11-03 NOTE — Discharge Instructions (Signed)
Implanted Port Insertion, Care After °This sheet gives you information about how to care for yourself after your procedure. Your health care provider may also give you more specific instructions. If you have problems or questions, contact your health care provider. °What can I expect after the procedure? °After your procedure, it is common to have: °· Discomfort at the port insertion site. °· Bruising on the skin over the port. This should improve over 3-4 days. ° °Follow these instructions at home: °Port care °· After your port is placed, you will get a manufacturer's information card. The card has information about your port. Keep this card with you at all times. °· Take care of the port as told by your health care provider. Ask your health care provider if you or a family member can get training for taking care of the port at home. A home health care nurse may also take care of the port. °· Make sure to remember what type of port you have. °Incision care °· Follow instructions from your health care provider about how to take care of your port insertion site. Make sure you: °? Wash your hands with soap and water before you change your bandage (dressing). If soap and water are not available, use hand sanitizer. °? Change your dressing as told by your health care provider. °? Leave stitches (sutures), skin glue, or adhesive strips in place. These skin closures may need to stay in place for 2 weeks or longer. If adhesive strip edges start to loosen and curl up, you may trim the loose edges. Do not remove adhesive strips completely unless your health care provider tells you to do that. °· Check your port insertion site every day for signs of infection. Check for: °? More redness, swelling, or pain. °? More fluid or blood. °? Warmth. °? Pus or a bad smell. °General instructions °· Do not take baths, swim, or use a hot tub until your health care provider approves. °· Do not lift anything that is heavier than 10 lb (4.5  kg) for a week, or as told by your health care provider. °· Ask your health care provider when it is okay to: °? Return to work or school. °? Resume usual physical activities or sports. °· Do not drive for 24 hours if you were given a medicine to help you relax (sedative). °· Take over-the-counter and prescription medicines only as told by your health care provider. °· Wear a medical alert bracelet in case of an emergency. This will tell any health care providers that you have a port. °· Keep all follow-up visits as told by your health care provider. This is important. °Contact a health care provider if: °· You cannot flush your port with saline as directed, or you cannot draw blood from the port. °· You have a fever or chills. °· You have more redness, swelling, or pain around your port insertion site. °· You have more fluid or blood coming from your port insertion site. °· Your port insertion site feels warm to the touch. °· You have pus or a bad smell coming from the port insertion site. °Get help right away if: °· You have chest pain or shortness of breath. °· You have bleeding from your port that you cannot control. °Summary °· Take care of the port as told by your health care provider. °· Change your dressing as told by your health care provider. °· Keep all follow-up visits as told by your health care provider. °  This information is not intended to replace advice given to you by your health care provider. Make sure you discuss any questions you have with your health care provider. °Document Released: 07/19/2013 Document Revised: 08/19/2016 Document Reviewed: 08/19/2016 °Elsevier Interactive Patient Education © 2017 [employer] ° °

## 2017-11-03 NOTE — Progress Notes (Signed)
History of Present Illness  There is no documented history at this time  Assessments & Plan   There are no diagnoses linked to this encounter.    Additional instructions  Subjective:  Patient presents with venous ulcer of the Bilateral lower extremity.    Procedure:  3 layer unna wrap was placed Bilateral lower extremity.   Plan:   Follow up in one week.  

## 2017-11-04 ENCOUNTER — Encounter: Payer: Self-pay | Admitting: Vascular Surgery

## 2017-11-04 ENCOUNTER — Ambulatory Visit (INDEPENDENT_AMBULATORY_CARE_PROVIDER_SITE_OTHER): Payer: Medicare Other | Admitting: Internal Medicine

## 2017-11-04 DIAGNOSIS — D649 Anemia, unspecified: Secondary | ICD-10-CM | POA: Diagnosis not present

## 2017-11-04 DIAGNOSIS — E162 Hypoglycemia, unspecified: Secondary | ICD-10-CM

## 2017-11-04 DIAGNOSIS — E1151 Type 2 diabetes mellitus with diabetic peripheral angiopathy without gangrene: Secondary | ICD-10-CM

## 2017-11-04 DIAGNOSIS — C8318 Mantle cell lymphoma, lymph nodes of multiple sites: Secondary | ICD-10-CM

## 2017-11-04 DIAGNOSIS — S42294D Other nondisplaced fracture of upper end of right humerus, subsequent encounter for fracture with routine healing: Secondary | ICD-10-CM | POA: Diagnosis not present

## 2017-11-04 DIAGNOSIS — W19XXXA Unspecified fall, initial encounter: Secondary | ICD-10-CM | POA: Diagnosis not present

## 2017-11-04 DIAGNOSIS — I10 Essential (primary) hypertension: Secondary | ICD-10-CM | POA: Diagnosis not present

## 2017-11-04 DIAGNOSIS — R600 Localized edema: Secondary | ICD-10-CM | POA: Diagnosis not present

## 2017-11-04 NOTE — Patient Instructions (Addendum)
Change your basal rate to .6   Change your bolus from 1:8 to 1:10  Change your target glucose to 120-150  Dr Sammuel Hines office will call you with an appointment

## 2017-11-04 NOTE — Progress Notes (Signed)
[name], male   DOB: Oct 14, 1945, 72 y.o.   MRN: [id number]   Subjective:    [name], male    DOB: 01-01-1946, 72 y.o.   MRN: [id number]  HPI  Patient here for hospital follow up.  He was admitted 10/28/17 after a fall.  Diagnosed:  Hypoglycemia.  Hospital notes reviewed.  Has insulin pump.  Diabetes coordinator apparently met with him in the hospital and discussed his pump and adjustments, etc.  With his fall, he suffered injury to his left wrist.  Has left wrist drop.  Apparently declined further w/up in the hospital.  Has f/u with ortho.  Discussed wearing a brace and occupational therapy.  He is planning to start chemo for lymphoma.  Being followed at oncology.  Had porta cath inserted yesterday.  Since his discharge, he states he has been doing relatively well.  Persistent left wrist drop.  No headache.  No dizziness.  No chest pain.  Breathing stable.  Eating.  Does report sugar yesterday am was in the 40s.  The day prior, am sugar 51.  The remainder of his sugars vary - from 120-340.  He does not have awareness of low sugars.  States he ate this am prior to coming to this appt.     Past Medical History:  Diagnosis Date  . Depression   . 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 Gastroparesis   . Graves disease   . Hypercholesterolemia   . Hypertension   . Mantle cell lymphoma ([place])   . Peripheral neuropathy    Past Surgical History:  Procedure Laterality Date  . CHOLECYSTECTOMY    . NEPHRECTOMY     right  . PORTA CATH INSERTION N/A 11/03/2017   Procedure: PORTA CATH INSERTION;  Surgeon: Katha Cabal, MD;  Location: [facility];  Service: Cardiovascular;  Laterality: N/A;   Family History  Problem Relation Age of Onset  . Breast cancer Mother   . Diabetes Father   . Cancer Father        Lymphoma  . Alcohol abuse Maternal Uncle   . Diabetes Sister   . Heart disease Unknown     grandfather   Social History   Socioeconomic History  . Marital status: Married    Spouse name: None  . Number of children: 1  . Years of education: None  . Highest education level: None  Social Needs  . Financial resource strain: None  . Food insecurity - worry: None  . Food insecurity - inability: None  . Transportation needs - medical: None  . Transportation needs - non-medical: None  Occupational History  . None  Tobacco Use  . Smoking status: Former Research scientist (life sciences)  . Smokeless tobacco: Current User    Types: Chew  Substance and Sexual Activity  . Alcohol use: Yes    Alcohol/week: 1.8 oz    Types: 3 Cans of beer per week    Comment: nightly  . Drug use: No  . Sexual activity: Not Currently  Other Topics Concern  . None  Social History Narrative  . None    Outpatient Encounter Medications as of 11/04/2017  Medication Sig  . ACCU-CHEK AVIVA PLUS test strip USE AS INSTRUCTED. USE 1 STRIP EVERY 3 HOURS . ACCU-CHECK AVIVA PLUS TEST STRIP. DX B16.945  . carvedilol (COREG) 3.125 MG tablet Take 3.125 mg by mouth 2 (two) times daily with a meal.   .  ferrous sulfate 325 (65 FE) MG tablet Take 325 mg by mouth daily.  . furosemide (LASIX) 20 MG tablet Take 1 tablet (20 mg total) by mouth daily.  Marland Kitchen glucose 4 GM chewable tablet Chew 1 tablet by mouth once as needed for low blood sugar.  . insulin aspart (NOVOLOG) 100 UNIT/ML injection Basal rate 12am 0.8 units per hour, 9am 0.9 units per hour. (total basal insulin 20.7 units). Carbohydrate ratio 1 units for every 8gm of carbohydrate. Correction factor 1 units for every '50mg'$ /dl over target cbg. Target CBG 80-120  . levothyroxine (SYNTHROID, LEVOTHROID) 25 MCG tablet Take 25 mcg by mouth daily.  Marland Kitchen lisinopril (PRINIVIL,ZESTRIL) 20 MG tablet Take 20 mg by mouth daily.  . Multiple Vitamin (MULTIVITAMIN) tablet Take 1 tablet by mouth daily.  . naproxen sodium (ALEVE) 220 MG tablet Take 220 mg by mouth daily as needed.  . pravastatin  (PRAVACHOL) 40 MG tablet Take 1 tablet (40 mg total) by mouth at bedtime. Will need office visit for any more refills  . acyclovir (ZOVIRAX) 400 MG tablet Take 1 tablet (400 mg total) by mouth daily. (Patient not taking: Reported on 10/28/2017)  . ondansetron (ZOFRAN) 8 MG tablet Take 1 tablet (8 mg total) by mouth 2 (two) times daily as needed for refractory nausea / vomiting. (Patient not taking: Reported on 11/01/2017)  . pantoprazole (PROTONIX) 40 MG tablet TAKE 1 TABLET (40 MG TOTAL) BY MOUTH DAILY. (Patient not taking: Reported on 10/28/2017)  . prochlorperazine (COMPAZINE) 10 MG tablet Take 1 tablet (10 mg total) by mouth every 6 (six) hours as needed (Nausea or vomiting). (Patient not taking: Reported on 11/01/2017)   No facility-administered encounter medications on file as of 11/04/2017.     Review of Systems  Constitutional: Negative for appetite change and unexpected weight change.  HENT: Negative for congestion and sinus pressure.   Respiratory: Negative for cough, chest tightness and shortness of breath.   Cardiovascular: Negative for chest pain and palpitations.       Legs are wrapped.  Followed by vascular surgery.   Gastrointestinal: Negative for abdominal pain, diarrhea, nausea and vomiting.  Genitourinary: Negative for difficulty urinating and dysuria.  Musculoskeletal: Negative for joint swelling and myalgias.       Some pain - left wrist/arm.  No pain in neck.    Skin: Negative for color change and rash.  Neurological: Negative for dizziness, light-headedness and headaches.  Psychiatric/Behavioral: Negative for agitation and dysphoric mood.       Objective:    Physical Exam  Constitutional: He appears well-developed and well-nourished. No distress.  HENT:  Nose: Nose normal.  Mouth/Throat: Oropharynx is clear and moist.  Neck: Neck supple. No thyromegaly present.  Cardiovascular: Normal rate and regular rhythm.  Pulmonary/Chest: Effort normal and breath sounds  normal. No respiratory distress.  Abdominal: Soft. Bowel sounds are normal. There is no tenderness.  Musculoskeletal:  Legs wrapped.  Decreased swelling.    Lymphadenopathy:    He has no cervical adenopathy.  Skin: Skin is warm.  Some bruising noted - arms.   Psychiatric: He has a normal mood and affect. His behavior is normal.    BP 130/68 (BP Location: Left Arm, Patient Position: Sitting, Cuff Size: Normal)   Pulse 89   Temp 97.6 F (36.4 C) (Oral)   Resp 20   Wt 260 lb 9.6 oz (118.2 kg)   SpO2 97%   BMI 37.39 kg/m  Wt Readings from Last 3 Encounters:  11/04/17 260 lb 9.6 oz (  118.2 kg)  11/03/17 255 lb (115.7 kg)  11/03/17 255 lb (115.7 kg)     Lab Results  Component Value Date   WBC 6.7 11/02/2017   HGB 12.4 (L) 11/02/2017   HCT 38.4 (L) 11/02/2017   PLT 154 11/02/2017   GLUCOSE 70 11/02/2017   CHOL 197 08/09/2015   TRIG 51.0 08/09/2015   HDL 73.60 08/09/2015   LDLCALC 113 (H) 08/09/2015   ALT 18 11/02/2017   AST 32 11/02/2017   NA 139 11/02/2017   K 4.0 11/02/2017   CL 100 (L) 11/02/2017   CREATININE 0.95 11/02/2017   BUN 14 11/02/2017   CO2 31 11/02/2017   TSH 3.894 10/28/2017   INR 1.17 01/12/2017   HGBA1C 5.6 10/28/2017   MICROALBUR 1.9 09/22/2016       Assessment & Plan:   Problem List Items Addressed This Visit    Anemia    Hemoglobin at discharge - 12.4.  Follow.        Bilateral lower extremity edema    Seeing vascular surgery.  Legs wrapped yesterday.        Diabetes [facility])    Admitted recently with hypoglycemia as outlined.  Discussed with Dr Eddie Dibbles.  F/u planned as outlined.        Essential hypertension, benign    Blood pressure under good control.  Continue same medication regimen.  Follow pressures.  Follow metabolic panel.        Fall    Recent fall with resulting left wrist drop.  No headache.  No dizziness.  Discussed wrist splint.  Has f/u with ortho.  Discussed OT.  Will notify me if wants me to order.  Wanted to discuss with  ortho.        Humerus fracture    Healing now.  Has been seeing ortho.        Hypoglycemia    Recently admitted with hypoglycemia/fall.  Has insulin pump.  Followed by Dr Eddie Dibbles.  Sugars reviewed.  Low sugars in am the last few days.  Discussed importance of eating regular meals.  Discussed the importance of not giving himself extra boluses.  Sugar this am in the office in the 38s.  He has no awareness of low blood sugar.  States he ate this am and gave himself 6-8 units of insulin.  States his basal rate is .9.  He was given glucose tablet and wife gave him two more.  He also drank a coke and ate a pack of crackers - food he had brought with him.  Sugar initially stayed around the 50s range.  Prior to leaving, blood sugar 112-117.  Discussed my concerns regarding his low sugar with Dr Eddie Dibbles.  She instructed on changing pump settings.  The patient was unable to do this.  I had a long discussion with the patient about the importance of avoiding low sugars, eating regular meals and not going long periods without eating.  I also discussed with him regarding not giving extra boluses - explaining more dangerous to have low sugar.  After discussion with endocrinology, pt was advised to come now to Dr Sammuel Hines office and let them adjust his meter.  He was reluctant at first, but prior to leaving agreed that he would go to her office straight from our office and have his pump settings adjusted.  Stressed to him the importance of this to avoid more low sugars.  Pt agreed.  She also plans to get him scheduled for more education regarding  his pump and diabetes.  Discussed earlier work in appt with her as well.        Mantle cell lymphoma (HCC)    Progression.  Followed by oncology.  Had porta cath placed yesterday.  Planning to start treatments.         I spent over 45  minutes with the patient and more than 50% of the time was spent in consultation regarding the above.  Time spent obtaining the history, discussing  his current issues and concerns and discussing his low sugar.  Time also spent discussing his pump and treatment.    [name], MD

## 2017-11-05 ENCOUNTER — Telehealth: Payer: Self-pay

## 2017-11-05 NOTE — Assessment & Plan Note (Signed)
Hemoglobin at discharge - 12.4.  Follow.

## 2017-11-05 NOTE — Assessment & Plan Note (Signed)
Progression.  Followed by oncology.  Had porta cath placed yesterday.  Planning to start treatments.

## 2017-11-05 NOTE — Telephone Encounter (Signed)
Copied from [place] [id number]. Topic: Inquiry >> Nov 05, 2017  8:57 AM Scherrie Gerlach wrote: Reason for CRM:  Dr Eddie Dibbles would like [name] to call her concerning this pt. Dr Eddie Dibbles states she saw him yesterday and he was not doing very good. Would like her to call at Dr [employer] convenience. She will have her cell phone on her today. Cell:  [phone]

## 2017-11-05 NOTE — Telephone Encounter (Signed)
Called and spoke to pt.  He is doing ok.  Discussed him missing his appt with Dr Eddie Dibbles.  Discussed the need for him to f/u with her.  He will call her office Monday.  Lowest his sugar reading today - 140s.

## 2017-11-05 NOTE — Assessment & Plan Note (Signed)
Admitted recently with hypoglycemia as outlined.  Discussed with Dr Eddie Dibbles.  F/u planned as outlined.

## 2017-11-05 NOTE — Assessment & Plan Note (Signed)
Seeing vascular surgery.  Legs wrapped yesterday.

## 2017-11-05 NOTE — Assessment & Plan Note (Signed)
Recently admitted with hypoglycemia/fall.  Has insulin pump.  Followed by Dr Eddie Dibbles.  Sugars reviewed.  Low sugars in am the last few days.  Discussed importance of eating regular meals.  Discussed the importance of not giving himself extra boluses.  Sugar this am in the office in the 45s.  He has no awareness of low blood sugar.  States he ate this am and gave himself 6-8 units of insulin.  States his basal rate is .9.  He was given glucose tablet and wife gave him two more.  He also drank a coke and ate a pack of crackers - food he had brought with him.  Sugar initially stayed around the 50s range.  Prior to leaving, blood sugar 112-117.  Discussed my concerns regarding his low sugar with Dr Eddie Dibbles.  She instructed on changing pump settings.  The patient was unable to do this.  I had a long discussion with the patient about the importance of avoiding low sugars, eating regular meals and not going long periods without eating.  I also discussed with him regarding not giving extra boluses - explaining more dangerous to have low sugar.  After discussion with endocrinology, pt was advised to come now to Dr Sammuel Hines office and let them adjust his meter.  He was reluctant at first, but prior to leaving agreed that he would go to her office straight from our office and have his pump settings adjusted.  Stressed to him the importance of this to avoid more low sugars.  Pt agreed.  She also plans to get him scheduled for more education regarding his pump and diabetes.  Discussed earlier work in appt with her as well.

## 2017-11-05 NOTE — Assessment & Plan Note (Signed)
Blood pressure under good control.  Continue same medication regimen.  Follow pressures.  Follow metabolic panel.   

## 2017-11-05 NOTE — Telephone Encounter (Signed)
Called and spoke to Dr Eddie Dibbles.  Pt did not show for his appt.  They tried to contact him today to be seen.  No return call.

## 2017-11-05 NOTE — Assessment & Plan Note (Signed)
Recent fall with resulting left wrist drop.  No headache.  No dizziness.  Discussed wrist splint.  Has f/u with ortho.  Discussed OT.  Will notify me if wants me to order.  Wanted to discuss with ortho.

## 2017-11-05 NOTE — Assessment & Plan Note (Signed)
Healing now.  Has been seeing ortho.

## 2017-11-07 NOTE — Progress Notes (Signed)
[place]  [phone]) [phone] [phone]) [phone]  ID: Gary [name] OB: 09-22-1946  MR#: [id number]  TML#:[id number]  Patient Care Team: [name], MD as PCP - General (Internal Medicine) Pleasant, Eppie Gibson, RN as [place] Management  CHIEF COMPLAINT: Mantle cell lymphoma.  INTERVAL HISTORY: Patient returns returns clinic today for further evaluation and initiation of cycle 1, day 1 of Rituxan and Treanda.  He was unable to get treatment last week secondary to poor access, but now has a port in place.  He currently feels well and is asymptomatic. He denies any fevers, chills, or night sweats. He has a good appetite and denies weight loss. He has no neurologic complaints. He denies any chest pain or shortness of breath. He has no nausea, vomiting, constipation, or diarrhea.  He denies any melena or hematochezia.  He has no urinary complaints. Patient offers no specific complaints today.  REVIEW OF SYSTEMS:   Review of Systems  Constitutional: Negative for diaphoresis, fever, malaise/fatigue and weight loss.  Respiratory: Negative.  Negative for cough and shortness of breath.   Cardiovascular: Negative.  Negative for chest pain and leg swelling.  Gastrointestinal: Negative.  Negative for abdominal pain, blood in stool and melena.  Genitourinary: Negative.  Negative for hematuria.  Musculoskeletal: Positive for falls.  Skin: Negative.  Negative for rash.  Neurological: Negative.  Negative for sensory change and weakness.  Psychiatric/Behavioral: Negative.  The patient is not nervous/anxious.     As per HPI. Otherwise, a complete review of systems is negative.  PAST MEDICAL HISTORY: Past Medical History:  Diagnosis Date  . Depression   . 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 Gastroparesis   . Graves disease   . Hypercholesterolemia   . Hypertension   . Mantle cell lymphoma  ([place])   . Peripheral neuropathy     PAST SURGICAL HISTORY: Past Surgical History:  Procedure Laterality Date  . CHOLECYSTECTOMY    . NEPHRECTOMY     right  . PORTA CATH INSERTION N/A 11/03/2017   Procedure: PORTA CATH INSERTION;  Surgeon: Katha Cabal, MD;  Location: [facility];  Service: Cardiovascular;  Laterality: N/A;    FAMILY HISTORY Family History  Problem Relation Age of Onset  . Breast cancer Mother   . Diabetes Father   . Cancer Father        Lymphoma  . Alcohol abuse Maternal Uncle   . Diabetes Sister   . Heart disease Unknown        grandfather       ADVANCED DIRECTIVES:    HEALTH MAINTENANCE: Social History   Tobacco Use  . Smoking status: Former Research scientist (life sciences)  . Smokeless tobacco: Current User    Types: Chew  Substance Use Topics  . Alcohol use: Yes    Alcohol/week: 1.8 oz    Types: 3 Cans of beer per week    Comment: nightly  . Drug use: No     Colonoscopy:  PAP:  Bone density:  Lipid panel:  Allergies  Allergen Reactions  . Rofecoxib Nausea Only    Current Outpatient Medications  Medication Sig Dispense Refill  . ACCU-CHEK AVIVA PLUS test strip USE AS INSTRUCTED. USE 1 STRIP EVERY 3 HOURS . ACCU-CHECK AVIVA PLUS TEST STRIP. DX E10.649  12  . carvedilol (COREG) 3.125 MG tablet Take 3.125 mg by mouth 2 (two) times daily with a meal.     . ferrous sulfate  325 (65 FE) MG tablet Take 325 mg by mouth daily.    . furosemide (LASIX) 20 MG tablet Take 1 tablet (20 mg total) by mouth daily. 30 tablet 1  . glucose 4 GM chewable tablet Chew 1 tablet by mouth once as needed for low blood sugar.    . insulin aspart (NOVOLOG) 100 UNIT/ML injection Basal rate 12am 0.8 units per hour, 9am 0.9 units per hour. (total basal insulin 20.7 units). Carbohydrate ratio 1 units for every 8gm of carbohydrate. Correction factor 1 units for every 19m/dl over target cbg. Target CBG 80-120 1 vial 12  . levothyroxine (SYNTHROID, LEVOTHROID) 25 MCG tablet Take  25 mcg by mouth daily.    .Marland Kitchenlisinopril (PRINIVIL,ZESTRIL) 20 MG tablet Take 20 mg by mouth daily.    . Multiple Vitamin (MULTIVITAMIN) tablet Take 1 tablet by mouth daily.    . naproxen sodium (ALEVE) 220 MG tablet Take 220 mg by mouth daily as needed.    . ondansetron (ZOFRAN) 8 MG tablet Take 1 tablet (8 mg total) by mouth 2 (two) times daily as needed for refractory nausea / vomiting. 30 tablet 2  . pantoprazole (PROTONIX) 40 MG tablet TAKE 1 TABLET (40 MG TOTAL) BY MOUTH DAILY. 30 tablet 0  . pravastatin (PRAVACHOL) 40 MG tablet Take 1 tablet (40 mg total) by mouth at bedtime. Will need office visit for any more refills 30 tablet 0  . prochlorperazine (COMPAZINE) 10 MG tablet Take 1 tablet (10 mg total) by mouth every 6 (six) hours as needed (Nausea or vomiting). 60 tablet 2  . acyclovir (ZOVIRAX) 400 MG tablet Take 1 tablet (400 mg total) by mouth daily. (Patient not taking: Reported on 11/09/2017) 30 tablet 3   No current facility-administered medications for this visit.   Race: Please given the multiple things to do this would like to do a little bit for myself before do anything  OBJECTIVE: Vitals:   11/09/17 0955  BP: 125/74  Pulse: 90  Resp: 18  Temp: (!) 97.3 F (36.3 C)     Body mass index is 36.93 kg/m.    ECOG FS:0 - Asymptomatic  General: Disheveled appearing, no acute distress. Eyes: Pink conjunctiva, anicteric sclera. Lungs: Clear to auscultation bilaterally. Heart: Regular rate and rhythm. No rubs, murmurs, or gallops. Abdomen: Soft, nontender, nondistended. No organomegaly noted, normoactive bowel sounds. Musculoskeletal: No edema, cyanosis, or clubbing. Neuro: Alert, answering all questions appropriately. Cranial nerves grossly intact. Skin: No rashes or petechiae noted. Psych: Normal affect. Lymphatics: No cervical, calvicular, axillary or inguinal LAD.   LAB RESULTS:  Lab Results  Component Value Date   NA 140 11/09/2017   K 3.8 11/09/2017   CL 104  11/09/2017   CO2 26 11/09/2017   GLUCOSE 88 11/09/2017   BUN 15 11/09/2017   CREATININE 0.94 11/09/2017   CALCIUM 7.9 (L) 11/09/2017   PROT 6.2 (L) 11/09/2017   ALBUMIN 3.1 (L) 11/09/2017   AST 26 11/09/2017   ALT 14 (L) 11/09/2017   ALKPHOS 188 (H) 11/09/2017   BILITOT 0.7 11/09/2017   GFRNONAA >60 11/09/2017   GFRAA >60 11/09/2017    Lab Results  Component Value Date   WBC 5.0 11/09/2017   NEUTROABS 3.3 11/09/2017   HGB 11.6 (L) 11/09/2017   HCT 35.8 (L) 11/09/2017   MCV 87.4 11/09/2017   PLT 172 11/09/2017   Lab Results  Component Value Date   IRON 13 (L) 08/11/2017   TIBC 223 (L) 01/12/2017   IRONPCTSAT 3.8 (L)  08/11/2017   Lab Results  Component Value Date   FERRITIN 42.0 08/11/2017     STUDIES: Ct Head Wo Contrast  Result Date: 10/28/2017 CLINICAL DATA:  Head trauma, ataxia EXAM: CT HEAD WITHOUT CONTRAST CT CERVICAL SPINE WITHOUT CONTRAST TECHNIQUE: Multidetector CT imaging of the head and cervical spine was performed following the standard protocol without intravenous contrast. Multiplanar CT image reconstructions of the cervical spine were also generated. COMPARISON:  07/28/2017 FINDINGS: CT HEAD FINDINGS Brain: No evidence of acute infarction, hemorrhage, extra-axial collection, ventriculomegaly, or mass effect. Generalized cerebral atrophy. Periventricular white matter low attenuation likely secondary to microangiopathy. Vascular: Cerebrovascular atherosclerotic calcifications are noted. Skull: Negative for fracture or focal lesion. Sinuses/Orbits: Visualized portions of the orbits are unremarkable. Visualized portions of the paranasal sinuses and mastoid air cells are unremarkable. Other: None. CT CERVICAL SPINE FINDINGS Alignment: Normal. Skull base and vertebrae: No acute fracture. No primary bone lesion or focal pathologic process. Soft tissues and spinal canal: No prevertebral fluid or swelling. No visible canal hematoma. Disc levels: Degenerative disc disease  with disc height loss at C3-4 and C6-7. Bilateral uncovertebral degenerative changes at C3-4 and mild left facet arthropathy. Mild bilateral facet arthropathy at C4-5 and C5-6. Upper chest: Lung apices are clear. Other: Small amount air in the right jugular vein likely iatrogenic. IMPRESSION: 1. No acute intracranial pathology. 2.  No acute osseous injury of the cervical spine. Electronically Signed   By: Kathreen Devoid   On: 10/28/2017 12:28   Ct Chest W Contrast  Result Date: 10/19/2017 CLINICAL DATA:  Low-grade mantle cell lymphoma diagnosed September 2015. Interval observation. EXAM: CT CHEST, ABDOMEN, AND PELVIS WITH CONTRAST TECHNIQUE: Multidetector CT imaging of the chest, abdomen and pelvis was performed following the standard protocol during bolus administration of intravenous contrast. CONTRAST:  170m ISOVUE-300 IOPAMIDOL (ISOVUE-300) INJECTION 61% COMPARISON:  03/17/2017 CT chest, abdomen and pelvis. FINDINGS: CT CHEST FINDINGS Cardiovascular: Normal heart size. No significant pericardial fluid/thickening. Left anterior descending and right coronary atherosclerosis. Normal course and caliber of the thoracic aorta. Main pulmonary artery diameter 3.1 cm, top-normal, stable. No central pulmonary emboli. Mediastinum/Nodes: No discrete thyroid nodules. Unremarkable esophagus. Increased bilateral axillary/retropectoral adenopathy. Representative enlarged 1.6 cm right axillary node (series 2/image 22), previously 1.0 cm, increased. Representative enlarged 1.3 cm left retropectoral node (series 2/image 19), previously 0.9 cm, increased. Newly mildly enlarged 0.8 cm left internal mammary node (series 2/image 30). Mildly enlarged right paratracheal nodes up to 1.3 cm (series 2/image 27), previously 1.1 cm, mildly increased. Enlarged 1.7 cm subcarinal node (series 2/image 34), previously 1.6 cm, slightly increased. Enlarged 1.9 cm right hilar node (series 2/image 30), previously 1.5 cm, increased. Newly enlarged  1.1 cm left hilar node (series 2/image 34). Lungs/Pleura: No pneumothorax. Small dependent bilateral pleural effusions, right greater than left. Mild hypoventilatory changes in the dependent lower lobes. Mild paraseptal emphysema. No acute consolidative airspace disease or lung masses. Two solid pulmonary nodules in the upper lobes, largest 6 mm in the medial left upper lobe (series 4/image 52), both stable. No new significant pulmonary nodules. Musculoskeletal: No aggressive appearing focal osseous lesions. Incompletely healed comminuted proximal right humerus fracture, as originally diagnosed on 08/03/2017 right shoulder radiographs. Mild thoracic spondylosis. CT ABDOMEN PELVIS FINDINGS Hepatobiliary: Normal liver size. No liver mass. Cholecystectomy. No biliary ductal dilatation. Pancreas: Normal, with no mass or duct dilation. Spleen: Normal size. No mass. Adrenals/Urinary Tract: Normal adrenals. Right nephrectomy. No mass in the right nephrectomy bed. Simple exophytic 1.2 cm lower left renal cyst. No  additional left renal lesions. No left hydronephrosis. Normal bladder. Stomach/Bowel: Normal non-distended stomach. Normal caliber small bowel with no small bowel wall thickening. Normal appendix. Oral contrast transits to the left colon. Normal large bowel with no diverticulosis, large bowel wall thickening or pericolonic fat stranding. Vascular/Lymphatic: Atherosclerotic nonaneurysmal abdominal aorta. Patent portal, splenic and left renal veins. Left para-aortic adenopathy measures up to 2.7 cm (series 2/image 80), previously 1.8 cm, increased. Right paracaval adenopathy measures up to 1.8 cm (series 2/image 92), previously 1.4 cm, increased. Bulky bilateral common iliac, external iliac and inguinal adenopathy is increased. Representative 4.8 cm left common iliac node, previously 2.5 cm, increased. Representative 3.9 cm left external iliac node (series 2/image 107), previously 2.9 cm, increased. Representative  2.8 cm right inguinal node (series 2/image 134), previously 2.1 cm, increased. Reproductive: Top-normal size prostate. Other: No pneumoperitoneum, ascites or focal fluid collection. Mild anasarca is new. Musculoskeletal: No aggressive appearing focal osseous lesions. Lumbar spondylosis. IMPRESSION: 1. Clear progression of lymphoma. Bilateral axillary, mediastinal, bilateral hilar, retroperitoneal and bulky bilateral pelvic adenopathy is all increased since 03/17/2017 CT. 2. Evidence of fluid third-spacing with small dependent bilateral pleural effusions and mild anasarca. 3. Comminuted proximal right humerus fracture is incompletely healed. 4. Chronic findings include: Aortic Atherosclerosis (ICD10-I70.0) and Emphysema (ICD10-J43.9). Coronary atherosclerosis. Electronically Signed   By: Ilona Sorrel M.D.   On: 10/19/2017 13:43   Ct Cervical Spine Wo Contrast  Result Date: 10/28/2017 CLINICAL DATA:  Head trauma, ataxia EXAM: CT HEAD WITHOUT CONTRAST CT CERVICAL SPINE WITHOUT CONTRAST TECHNIQUE: Multidetector CT imaging of the head and cervical spine was performed following the standard protocol without intravenous contrast. Multiplanar CT image reconstructions of the cervical spine were also generated. COMPARISON:  07/28/2017 FINDINGS: CT HEAD FINDINGS Brain: No evidence of acute infarction, hemorrhage, extra-axial collection, ventriculomegaly, or mass effect. Generalized cerebral atrophy. Periventricular white matter low attenuation likely secondary to microangiopathy. Vascular: Cerebrovascular atherosclerotic calcifications are noted. Skull: Negative for fracture or focal lesion. Sinuses/Orbits: Visualized portions of the orbits are unremarkable. Visualized portions of the paranasal sinuses and mastoid air cells are unremarkable. Other: None. CT CERVICAL SPINE FINDINGS Alignment: Normal. Skull base and vertebrae: No acute fracture. No primary bone lesion or focal pathologic process. Soft tissues and spinal  canal: No prevertebral fluid or swelling. No visible canal hematoma. Disc levels: Degenerative disc disease with disc height loss at C3-4 and C6-7. Bilateral uncovertebral degenerative changes at C3-4 and mild left facet arthropathy. Mild bilateral facet arthropathy at C4-5 and C5-6. Upper chest: Lung apices are clear. Other: Small amount air in the right jugular vein likely iatrogenic. IMPRESSION: 1. No acute intracranial pathology. 2.  No acute osseous injury of the cervical spine. Electronically Signed   By: Kathreen Devoid   On: 10/28/2017 12:28   Ct Abdomen Pelvis W Contrast  Result Date: 10/19/2017 CLINICAL DATA:  Low-grade mantle cell lymphoma diagnosed September 2015. Interval observation. EXAM: CT CHEST, ABDOMEN, AND PELVIS WITH CONTRAST TECHNIQUE: Multidetector CT imaging of the chest, abdomen and pelvis was performed following the standard protocol during bolus administration of intravenous contrast. CONTRAST:  126m ISOVUE-300 IOPAMIDOL (ISOVUE-300) INJECTION 61% COMPARISON:  03/17/2017 CT chest, abdomen and pelvis. FINDINGS: CT CHEST FINDINGS Cardiovascular: Normal heart size. No significant pericardial fluid/thickening. Left anterior descending and right coronary atherosclerosis. Normal course and caliber of the thoracic aorta. Main pulmonary artery diameter 3.1 cm, top-normal, stable. No central pulmonary emboli. Mediastinum/Nodes: No discrete thyroid nodules. Unremarkable esophagus. Increased bilateral axillary/retropectoral adenopathy. Representative enlarged 1.6 cm right axillary node (series  2/image 22), previously 1.0 cm, increased. Representative enlarged 1.3 cm left retropectoral node (series 2/image 19), previously 0.9 cm, increased. Newly mildly enlarged 0.8 cm left internal mammary node (series 2/image 30). Mildly enlarged right paratracheal nodes up to 1.3 cm (series 2/image 27), previously 1.1 cm, mildly increased. Enlarged 1.7 cm subcarinal node (series 2/image 34), previously 1.6 cm,  slightly increased. Enlarged 1.9 cm right hilar node (series 2/image 30), previously 1.5 cm, increased. Newly enlarged 1.1 cm left hilar node (series 2/image 34). Lungs/Pleura: No pneumothorax. Small dependent bilateral pleural effusions, right greater than left. Mild hypoventilatory changes in the dependent lower lobes. Mild paraseptal emphysema. No acute consolidative airspace disease or lung masses. Two solid pulmonary nodules in the upper lobes, largest 6 mm in the medial left upper lobe (series 4/image 52), both stable. No new significant pulmonary nodules. Musculoskeletal: No aggressive appearing focal osseous lesions. Incompletely healed comminuted proximal right humerus fracture, as originally diagnosed on 08/03/2017 right shoulder radiographs. Mild thoracic spondylosis. CT ABDOMEN PELVIS FINDINGS Hepatobiliary: Normal liver size. No liver mass. Cholecystectomy. No biliary ductal dilatation. Pancreas: Normal, with no mass or duct dilation. Spleen: Normal size. No mass. Adrenals/Urinary Tract: Normal adrenals. Right nephrectomy. No mass in the right nephrectomy bed. Simple exophytic 1.2 cm lower left renal cyst. No additional left renal lesions. No left hydronephrosis. Normal bladder. Stomach/Bowel: Normal non-distended stomach. Normal caliber small bowel with no small bowel wall thickening. Normal appendix. Oral contrast transits to the left colon. Normal large bowel with no diverticulosis, large bowel wall thickening or pericolonic fat stranding. Vascular/Lymphatic: Atherosclerotic nonaneurysmal abdominal aorta. Patent portal, splenic and left renal veins. Left para-aortic adenopathy measures up to 2.7 cm (series 2/image 80), previously 1.8 cm, increased. Right paracaval adenopathy measures up to 1.8 cm (series 2/image 92), previously 1.4 cm, increased. Bulky bilateral common iliac, external iliac and inguinal adenopathy is increased. Representative 4.8 cm left common iliac node, previously 2.5 cm,  increased. Representative 3.9 cm left external iliac node (series 2/image 107), previously 2.9 cm, increased. Representative 2.8 cm right inguinal node (series 2/image 134), previously 2.1 cm, increased. Reproductive: Top-normal size prostate. Other: No pneumoperitoneum, ascites or focal fluid collection. Mild anasarca is new. Musculoskeletal: No aggressive appearing focal osseous lesions. Lumbar spondylosis. IMPRESSION: 1. Clear progression of lymphoma. Bilateral axillary, mediastinal, bilateral hilar, retroperitoneal and bulky bilateral pelvic adenopathy is all increased since 03/17/2017 CT. 2. Evidence of fluid third-spacing with small dependent bilateral pleural effusions and mild anasarca. 3. Comminuted proximal right humerus fracture is incompletely healed. 4. Chronic findings include: Aortic Atherosclerosis (ICD10-I70.0) and Emphysema (ICD10-J43.9). Coronary atherosclerosis. Electronically Signed   By: Ilona Sorrel M.D.   On: 10/19/2017 13:43    ASSESSMENT: Low-grade mantle cell lymphoma diagnosed in September 2015, iron deficiency anemia.  PLAN:    1. Mantle cell lymphoma, stage III: Patient's initial diagnosis was on inguinal lymph node biopsy on July 12, 2014.  CT scan results from January 2019 reviewed independently and reported as above with clear evidence of progression of disease.  Patient declined bone marrow biopsy to complete the staging workup.  Although, this would not change his overall treatment plan.  Proceed with cycle 1, day 1 of Rituxan and Treanda.  Patient will receive Rituxan on day 1 with Treanda on days 1 and 2 every 28 days for 4-6 cycles.  Return to clinic tomorrow for Treanda only, in 1 week for laboratory work and assess his toleration of treatment and then in 4 weeks for consideration of cycle 2.   2.  Decreased ejection fraction: Previously patient had an ejection fraction of 40% with no clinical CHF. Monitor. 3. Diabetes: Continue insulin pump as directed. 4.  Thrombocytopenia: Resolved. 5. Hypertension: Continue treatment and evaluation per PCP. 6.  Iron deficiency anemia: Patient's hemoglobin is only mildly decreased.  Patient last received IV Feraheme on September 28, 2017.  Will consider additional IV Feraheme along with his treatment for mantle cell as above.     Patient expressed understanding and was in agreement with this plan. He also understands that He can call clinic at any time with any questions, concerns, or complaints.    Lloyd Huger, MD   11/09/2017 10:20 AM

## 2017-11-08 ENCOUNTER — Other Ambulatory Visit: Payer: Self-pay | Admitting: Oncology

## 2017-11-09 ENCOUNTER — Ambulatory Visit: Payer: Medicare Other | Admitting: Oncology

## 2017-11-09 ENCOUNTER — Inpatient Hospital Stay: Payer: Medicare Other

## 2017-11-09 ENCOUNTER — Other Ambulatory Visit: Payer: Medicare Other

## 2017-11-09 ENCOUNTER — Other Ambulatory Visit: Payer: Self-pay | Admitting: Oncology

## 2017-11-09 ENCOUNTER — Inpatient Hospital Stay (HOSPITAL_BASED_OUTPATIENT_CLINIC_OR_DEPARTMENT_OTHER): Payer: Medicare Other | Admitting: Oncology

## 2017-11-09 VITALS — BP 135/78 | HR 90 | Temp 98.5°F

## 2017-11-09 VITALS — BP 125/74 | HR 90 | Temp 97.3°F | Resp 18 | Wt 257.4 lb

## 2017-11-09 DIAGNOSIS — E0500 Thyrotoxicosis with diffuse goiter without thyrotoxic crisis or storm: Secondary | ICD-10-CM

## 2017-11-09 DIAGNOSIS — C8318 Mantle cell lymphoma, lymph nodes of multiple sites: Secondary | ICD-10-CM | POA: Diagnosis not present

## 2017-11-09 DIAGNOSIS — Z87891 Personal history of nicotine dependence: Secondary | ICD-10-CM

## 2017-11-09 DIAGNOSIS — E11649 Type 2 diabetes mellitus with hypoglycemia without coma: Secondary | ICD-10-CM

## 2017-11-09 DIAGNOSIS — Z7982 Long term (current) use of aspirin: Secondary | ICD-10-CM | POA: Diagnosis not present

## 2017-11-09 DIAGNOSIS — D509 Iron deficiency anemia, unspecified: Secondary | ICD-10-CM

## 2017-11-09 DIAGNOSIS — E1143 Type 2 diabetes mellitus with diabetic autonomic (poly)neuropathy: Secondary | ICD-10-CM | POA: Diagnosis not present

## 2017-11-09 DIAGNOSIS — I2510 Atherosclerotic heart disease of native coronary artery without angina pectoris: Secondary | ICD-10-CM

## 2017-11-09 DIAGNOSIS — C8310 Mantle cell lymphoma, unspecified site: Secondary | ICD-10-CM | POA: Diagnosis not present

## 2017-11-09 DIAGNOSIS — I10 Essential (primary) hypertension: Secondary | ICD-10-CM

## 2017-11-09 DIAGNOSIS — Z794 Long term (current) use of insulin: Secondary | ICD-10-CM

## 2017-11-09 DIAGNOSIS — E7800 Pure hypercholesterolemia, unspecified: Secondary | ICD-10-CM

## 2017-11-09 DIAGNOSIS — Z905 Acquired absence of kidney: Secondary | ICD-10-CM

## 2017-11-09 DIAGNOSIS — Z803 Family history of malignant neoplasm of breast: Secondary | ICD-10-CM

## 2017-11-09 DIAGNOSIS — E11311 Type 2 diabetes mellitus with unspecified diabetic retinopathy with macular edema: Secondary | ICD-10-CM | POA: Diagnosis not present

## 2017-11-09 DIAGNOSIS — Z9181 History of falling: Secondary | ICD-10-CM

## 2017-11-09 DIAGNOSIS — G629 Polyneuropathy, unspecified: Secondary | ICD-10-CM | POA: Diagnosis not present

## 2017-11-09 DIAGNOSIS — Z807 Family history of other malignant neoplasms of lymphoid, hematopoietic and related tissues: Secondary | ICD-10-CM

## 2017-11-09 DIAGNOSIS — Z5111 Encounter for antineoplastic chemotherapy: Secondary | ICD-10-CM | POA: Diagnosis not present

## 2017-11-09 DIAGNOSIS — Z79899 Other long term (current) drug therapy: Secondary | ICD-10-CM

## 2017-11-09 LAB — CBC WITH DIFFERENTIAL/PLATELET
Basophils Absolute: 0.1 10*3/uL (ref 0–0.1)
Basophils Relative: 1 %
Eosinophils Absolute: 0.3 10*3/uL (ref 0–0.7)
Eosinophils Relative: 5 %
HEMATOCRIT: 35.8 % — AB (ref 40.0–52.0)
HEMOGLOBIN: 11.6 g/dL — AB (ref 13.0–18.0)
LYMPHS PCT: 16 %
Lymphs Abs: 0.8 10*3/uL — ABNORMAL LOW (ref 1.0–3.6)
MCH: 28.4 pg (ref 26.0–34.0)
MCHC: 32.5 g/dL (ref 32.0–36.0)
MCV: 87.4 fL (ref 80.0–100.0)
MONO ABS: 0.6 10*3/uL (ref 0.2–1.0)
MONOS PCT: 12 %
NEUTROS ABS: 3.3 10*3/uL (ref 1.4–6.5)
Neutrophils Relative %: 66 %
Platelets: 172 10*3/uL (ref 150–440)
RBC: 4.09 MIL/uL — ABNORMAL LOW (ref 4.40–5.90)
RDW: 19.4 % — AB (ref 11.5–14.5)
WBC: 5 10*3/uL (ref 3.8–10.6)

## 2017-11-09 LAB — COMPREHENSIVE METABOLIC PANEL
ALBUMIN: 3.1 g/dL — AB (ref 3.5–5.0)
ALT: 14 U/L — ABNORMAL LOW (ref 17–63)
ANION GAP: 10 (ref 5–15)
AST: 26 U/L (ref 15–41)
Alkaline Phosphatase: 188 U/L — ABNORMAL HIGH (ref 38–126)
BUN: 15 mg/dL (ref 6–20)
CHLORIDE: 104 mmol/L (ref 101–111)
CO2: 26 mmol/L (ref 22–32)
Calcium: 7.9 mg/dL — ABNORMAL LOW (ref 8.9–10.3)
Creatinine, Ser: 0.94 mg/dL (ref 0.61–1.24)
GFR calc Af Amer: >60 mL/min (ref >60)
GFR calc non Af Amer: >60 mL/min (ref >60)
GLUCOSE: 88 mg/dL (ref 65–99)
POTASSIUM: 3.8 mmol/L (ref 3.5–5.1)
Sodium: 140 mmol/L (ref 135–145)
Total Bilirubin: 0.7 mg/dL (ref 0.3–1.2)
Total Protein: 6.2 g/dL — ABNORMAL LOW (ref 6.5–8.1)

## 2017-11-09 MED ORDER — PALONOSETRON HCL INJECTION 0.25 MG/5ML: 0.2500 mg | Freq: Once | INTRAVENOUS | Status: DC

## 2017-11-09 MED ORDER — DIPHENHYDRAMINE HCL 25 MG PO CAPS
25.0000 mg | ORAL_CAPSULE | Freq: Once | ORAL | Status: AC
  Administered 2017-11-09: 25 mg via ORAL
  Filled 2017-11-09: qty 1

## 2017-11-09 MED ORDER — PALONOSETRON HCL INJECTION 0.25 MG/5ML
0.2500 mg | Freq: Once | INTRAVENOUS | Status: AC
  Administered 2017-11-09: 0.25 mg via INTRAVENOUS
  Filled 2017-11-09: qty 5

## 2017-11-09 MED ORDER — SODIUM CHLORIDE 0.9 % IV SOLN
375.0000 mg/m2 | Freq: Once | INTRAVENOUS | Status: AC
  Administered 2017-11-09: 900 mg via INTRAVENOUS
  Filled 2017-11-09: qty 50

## 2017-11-09 MED ORDER — SODIUM CHLORIDE 0.9 % IV SOLN
Freq: Once | INTRAVENOUS | Status: AC
Start: 2017-11-09 — End: 2017-11-09
  Administered 2017-11-09: 11:00:00 via INTRAVENOUS
  Filled 2017-11-09: qty 1000

## 2017-11-09 MED ORDER — ACETAMINOPHEN 325 MG PO TABS
650.0000 mg | ORAL_TABLET | Freq: Once | ORAL | Status: AC
  Administered 2017-11-09: 650 mg via ORAL
  Filled 2017-11-09: qty 2

## 2017-11-09 MED ORDER — SODIUM CHLORIDE 0.9 % IV SOLN: 10.0000 mg | Freq: Once | INTRAVENOUS | Status: DC

## 2017-11-09 MED ORDER — SODIUM CHLORIDE 0.9 % IV SOLN
90.0000 mg/m2 | Freq: Once | INTRAVENOUS | Status: AC
  Administered 2017-11-09: 225 mg via INTRAVENOUS
  Filled 2017-11-09: qty 9

## 2017-11-09 MED ORDER — DEXAMETHASONE SODIUM PHOSPHATE 10 MG/ML IJ SOLN
10.0000 mg | Freq: Once | INTRAMUSCULAR | Status: AC
  Administered 2017-11-09: 10 mg via INTRAVENOUS
  Filled 2017-11-09: qty 1

## 2017-11-09 MED ORDER — HEPARIN SOD (PORK) LOCK FLUSH 100 UNIT/ML IV SOLN
500.0000 [IU] | Freq: Once | INTRAVENOUS | Status: AC | PRN
  Administered 2017-11-09: 500 [IU]
  Filled 2017-11-09: qty 5

## 2017-11-10 ENCOUNTER — Inpatient Hospital Stay: Payer: Medicare Other

## 2017-11-10 ENCOUNTER — Ambulatory Visit (INDEPENDENT_AMBULATORY_CARE_PROVIDER_SITE_OTHER): Payer: Medicare Other | Admitting: Vascular Surgery

## 2017-11-10 ENCOUNTER — Encounter (INDEPENDENT_AMBULATORY_CARE_PROVIDER_SITE_OTHER): Payer: Self-pay | Admitting: Vascular Surgery

## 2017-11-10 VITALS — BP 127/71 | HR 80 | Temp 96.6°F | Resp 20

## 2017-11-10 DIAGNOSIS — D509 Iron deficiency anemia, unspecified: Secondary | ICD-10-CM | POA: Diagnosis not present

## 2017-11-10 DIAGNOSIS — I890 Lymphedema, not elsewhere classified: Secondary | ICD-10-CM | POA: Diagnosis not present

## 2017-11-10 DIAGNOSIS — E11311 Type 2 diabetes mellitus with unspecified diabetic retinopathy with macular edema: Secondary | ICD-10-CM | POA: Diagnosis not present

## 2017-11-10 DIAGNOSIS — E11649 Type 2 diabetes mellitus with hypoglycemia without coma: Secondary | ICD-10-CM | POA: Diagnosis not present

## 2017-11-10 DIAGNOSIS — I10 Essential (primary) hypertension: Secondary | ICD-10-CM | POA: Diagnosis not present

## 2017-11-10 DIAGNOSIS — C8310 Mantle cell lymphoma, unspecified site: Secondary | ICD-10-CM | POA: Diagnosis not present

## 2017-11-10 DIAGNOSIS — C8318 Mantle cell lymphoma, lymph nodes of multiple sites: Secondary | ICD-10-CM

## 2017-11-10 DIAGNOSIS — Z5111 Encounter for antineoplastic chemotherapy: Secondary | ICD-10-CM | POA: Diagnosis not present

## 2017-11-10 MED ORDER — DEXAMETHASONE SODIUM PHOSPHATE 10 MG/ML IJ SOLN
10.0000 mg | Freq: Once | INTRAMUSCULAR | Status: AC
  Administered 2017-11-10: 10 mg via INTRAVENOUS
  Filled 2017-11-10: qty 1

## 2017-11-10 MED ORDER — HEPARIN SOD (PORK) LOCK FLUSH 100 UNIT/ML IV SOLN
500.0000 [IU] | Freq: Once | INTRAVENOUS | Status: AC | PRN
  Administered 2017-11-10: 500 [IU]
  Filled 2017-11-10: qty 5

## 2017-11-10 MED ORDER — SODIUM CHLORIDE 0.9 % IV SOLN: 10.0000 mg | Freq: Once | INTRAVENOUS | Status: DC

## 2017-11-10 MED ORDER — SODIUM CHLORIDE 0.9 % IV SOLN
Freq: Once | INTRAVENOUS | Status: AC
  Administered 2017-11-10: 14:00:00 via INTRAVENOUS
  Filled 2017-11-10: qty 1000

## 2017-11-10 MED ORDER — SODIUM CHLORIDE 0.9 % IV SOLN
90.0000 mg/m2 | Freq: Once | INTRAVENOUS | Status: AC
  Administered 2017-11-10: 225 mg via INTRAVENOUS
  Filled 2017-11-10: qty 9

## 2017-11-10 MED ORDER — SODIUM CHLORIDE 0.9% FLUSH
10.0000 mL | INTRAVENOUS | Status: DC | PRN
  Filled 2017-11-10: qty 10

## 2017-11-10 NOTE — Progress Notes (Signed)
History of Present Illness  There is no documented history at this time  Assessments & Plan   There are no diagnoses linked to this encounter.    Additional instructions  Subjective:  Patient presents with venous ulcer of the Bilateral lower extremity.    Procedure:  3 layer unna wrap was placed Bilateral lower extremity.   Plan:   Follow up in one week.  

## 2017-11-11 ENCOUNTER — Other Ambulatory Visit: Payer: Self-pay | Admitting: *Deleted

## 2017-11-11 ENCOUNTER — Encounter: Payer: Self-pay | Admitting: *Deleted

## 2017-11-11 DIAGNOSIS — I10 Essential (primary) hypertension: Secondary | ICD-10-CM | POA: Diagnosis not present

## 2017-11-11 DIAGNOSIS — I5022 Chronic systolic (congestive) heart failure: Secondary | ICD-10-CM | POA: Diagnosis not present

## 2017-11-11 DIAGNOSIS — I2510 Atherosclerotic heart disease of native coronary artery without angina pectoris: Secondary | ICD-10-CM | POA: Diagnosis not present

## 2017-11-11 NOTE — Patient Outreach (Signed)
[name] Care Management  11/11/2017  Gary [name] 01-14-1946 [id number]   [facility] attempted #1 follow up outreach call to patient.  Patient was unavailable. HIPPA compliance voicemail message left with return callback number.  Plan: RN will call patient again within 14 days.  [facility] Care Management [phone]

## 2017-11-11 NOTE — Patient Outreach (Addendum)
[facility]) Care Management  11/11/2017   ROYLEE CHAFFIN 29-Jun-1946 [id number]  RN Health Coach telephone call to patient.  [employer] compliance verified. Per patient he has been on the insulin pump for 16 years. Per patient his fasting blood sugar was 146. Patient A1C is 5.6. Patient has had the Medtronic insulin pump x 6 years. Patient was diagnosed  Since age 72 yrs.  Patient has a history of falls 3 weeks ago and broke arm. Patient stated when he has hyperglycemia he gets real nauseated. Per patient when his blood sugar is low he has no symptoms. Patient stated his legs weep and are being  wrapped by the vein clinic. Patient stated he uses a cane to ambulate sometimes. Patient started his first round of chemo this past week. Patient stated that his appetite is fair. Per patient he is mostly wanting to learn some more about diabetes.Patient has agreed to follow up outreach calls.    Current Medications:  Current Outpatient Medications  Medication Sig Dispense Refill  . ACCU-CHEK AVIVA PLUS test strip USE AS INSTRUCTED. USE 1 STRIP EVERY 3 HOURS . ACCU-CHECK AVIVA PLUS TEST STRIP. DX E10.649  12  . acyclovir (ZOVIRAX) 400 MG tablet Take 1 tablet (400 mg total) by mouth daily. (Patient not taking: Reported on 11/09/2017) 30 tablet 3  . carvedilol (COREG) 3.125 MG tablet Take 3.125 mg by mouth 2 (two) times daily with a meal.     . ferrous sulfate 325 (65 FE) MG tablet Take 325 mg by mouth daily.    . furosemide (LASIX) 20 MG tablet Take 1 tablet (20 mg total) by mouth daily. 30 tablet 1  . glucose 4 GM chewable tablet Chew 1 tablet by mouth once as needed for low blood sugar.    . insulin aspart (NOVOLOG) 100 UNIT/ML injection Basal rate 12am 0.8 units per hour, 9am 0.9 units per hour. (total basal insulin 20.7 units). Carbohydrate ratio 1 units for every 8gm of carbohydrate. Correction factor 1 units for every 50mg /dl over target cbg. Target CBG 80-120 1 vial 12  . levothyroxine  (SYNTHROID, LEVOTHROID) 25 MCG tablet Take 25 mcg by mouth daily.    Marland Kitchen lisinopril (PRINIVIL,ZESTRIL) 20 MG tablet Take 20 mg by mouth daily.    . Multiple Vitamin (MULTIVITAMIN) tablet Take 1 tablet by mouth daily.    . naproxen sodium (ALEVE) 220 MG tablet Take 220 mg by mouth daily as needed.    . ondansetron (ZOFRAN) 8 MG tablet Take 1 tablet (8 mg total) by mouth 2 (two) times daily as needed for refractory nausea / vomiting. 30 tablet 2  . pantoprazole (PROTONIX) 40 MG tablet TAKE 1 TABLET (40 MG TOTAL) BY MOUTH DAILY. 30 tablet 0  . pravastatin (PRAVACHOL) 40 MG tablet Take 1 tablet (40 mg total) by mouth at bedtime. Will need office visit for any more refills 30 tablet 0  . prochlorperazine (COMPAZINE) 10 MG tablet Take 1 tablet (10 mg total) by mouth every 6 (six) hours as needed (Nausea or vomiting). 60 tablet 2   No current facility-administered medications for this visit.     Functional Status:  In your present state of health, do you have any difficulty performing the following activities: 11/11/2017 11/11/2017  Hearing? N N  Vision? - N  Difficulty concentrating or making decisions? - N  Walking or climbing stairs? - Y  Comment - -  Dressing or bathing? - Y  Comment - patient is unable to get in tub/  he has to use sponge off baths  Doing errands, shopping? - Y  Comment - wife takes patient to dr appointments  Preparing Food and eating ? - Y  Using the Toilet? - N  In the past six months, have you accidently leaked urine? - Y  Do you have problems with loss of bowel control? - N  Managing your Medications? - Y  Comment - -  Managing your Finances? - Y  Comment - -  Housekeeping or managing your Housekeeping? - Y  Comment - -  Some recent data might be hidden    Fall/Depression Screening: Fall Risk  11/11/2017 11/09/2017 10/22/2017  Falls in the past year? Yes Yes Yes  Comment - - -  Number falls in past yr: 2 or more - 2 or more  Injury with Fall? Yes - Yes  Risk  Factor Category  High Fall Risk - High Fall Risk  Risk for fall due to : History of fall(s);Impaired balance/gait;Impaired mobility - History of fall(s)  Follow up Falls evaluation completed;Education provided - Education provided   [facility] 2/9 Scores 11/11/2017 10/22/2017 10/21/2017 10/22/2016 08/09/2015 04/09/2015 12/26/2014  PHQ - 2 Score 0 0 0 0 0 0 0  PHQ- 9 Score - - - - - - -   THN CM Care Plan Problem One     Most Recent Value  Care Plan Problem One  Knowledge Deficit in Self Management of Diabetes  Role Documenting the Problem One  [place] for Problem One  Active  THN Long Term Goal   Patient will not have any readmissions for hypoglycemia within the next 90 days  THN Long Term Goal Start Date  11/11/17  Interventions for Problem One Long Term Goal  RN discussed patient signs and symptoms of hypo and hyperglycemia. RN sent EMMI educational material of hypo and hyperglycemia. RN sent a picture chart of faces of hypo and hyperglycemia. RN will follow up with futher discussion and teach back  THN CM Short Term Goal #1   Patient will voiced understanding what the A1C means within the next 30 days  THN CM Short Term Goal #1 Start Date  11/11/17  Interventions for Short Term Goal #1  RN discussed what the A1c means. RN discussed what the patient A1C is. RN will send EMMI educational information and a Living well with diabetes book. RN will follow up with further discussion and outreach.   THN CM Short Term Goal #2   Patient will not report any falls within the next 30 days  THN CM Short Term Goal #2 Start Date  11/11/17  Interventions for Short Term Goal #2  RN sent patient educational material on fall prevent and how to get up from a fall. RN will follow up with further discussion and teach back  THN CM Short Term Goal #3  Patient will report receiving advance directive packet within the next 30 days  THN CM Short Term Goal #3 Start Date  11/11/17  Interventions for Short Tern Goal  #3  RN discussed advance directive. Patient agreed for packet to be sent for him and wife. RN will follow up to make sure patient received with next outreach.       Assessment:  Patient had recent fall 3 months ago Patient on a Medtronic insulin pump Patient is asymptomatic with hypoglycemia Patient will benefit from [facility] telephonic outreach for education and support for diabetes self management.  Plan: RN sent EMMI educational material  on A1C  RN discussed Advance Directive RN sent Advance Directive packet RN sent educational information on Fall prevention and How to get up from a fall RN sent living well with diabetes RN sent EMMI educational material on hypo and hyperglycemia RN sent the picture sheet on the faces of hyper and hypoglycemia RN sent information on making food choices from dine out menu RN will follow up within the month of February RN sent assessment and barriers letter to [place] Management [phone]

## 2017-11-12 DIAGNOSIS — G5630 Lesion of radial nerve, unspecified upper limb: Secondary | ICD-10-CM | POA: Insufficient documentation

## 2017-11-12 DIAGNOSIS — G5632 Lesion of radial nerve, left upper limb: Secondary | ICD-10-CM | POA: Diagnosis not present

## 2017-11-13 NOTE — Progress Notes (Signed)
Gary [name]  [phone] [phone]) [phone]  ID: Gary [name] OB: 11-20-1945  MR#: [id number]  OZD#:[id number]  Patient Care Team: [name], MD as PCP - General (Internal Medicine) Pleasant, Eppie Gibson, RN as [place] Management  CHIEF COMPLAINT: Mantle cell lymphoma.  INTERVAL HISTORY: Patient returns returns clinic today for further evaluation and to assess his toleration of of cycle 1, day 1 of Rituxan and Treanda.  He tolerated his treatment well without significant side effects.  He continues to feel well and is asymptomatic. He denies any fevers, chills, or night sweats. He has a good appetite and denies weight loss. He has no neurologic complaints. He denies any chest pain or shortness of breath. He has no nausea, vomiting, constipation, or diarrhea.  He denies any melena or hematochezia.  He has no urinary complaints. Patient offers no specific complaints today.  REVIEW OF SYSTEMS:   Review of Systems  Constitutional: Negative for diaphoresis, fever, malaise/fatigue and weight loss.  Respiratory: Negative.  Negative for cough and shortness of breath.   Cardiovascular: Negative.  Negative for chest pain and leg swelling.  Gastrointestinal: Negative.  Negative for abdominal pain, blood in stool and melena.  Genitourinary: Negative.  Negative for hematuria.  Musculoskeletal: Negative.  Negative for falls.  Skin: Negative.  Negative for rash.  Neurological: Negative.  Negative for sensory change and weakness.  Psychiatric/Behavioral: Negative.  The patient is not nervous/anxious.     As per HPI. Otherwise, a complete review of systems is negative.  PAST MEDICAL HISTORY: Past Medical History:  Diagnosis Date  . Depression   . 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 Gastroparesis   . Graves disease   . Hypercholesterolemia   . Hypertension   . Mantle cell lymphoma  ([place])   . Peripheral neuropathy     PAST SURGICAL HISTORY: Past Surgical History:  Procedure Laterality Date  . CHOLECYSTECTOMY    . NEPHRECTOMY     right  . PORTA CATH INSERTION N/A 11/03/2017   Procedure: PORTA CATH INSERTION;  Surgeon: Katha Cabal, MD;  Location: [place] [facility];  Service: Cardiovascular;  Laterality: N/A;    FAMILY HISTORY Family History  Problem Relation Age of Onset  . Breast cancer Mother   . Diabetes Father   . Cancer Father        Lymphoma  . Alcohol abuse Maternal Uncle   . Diabetes Sister   . Heart disease Unknown        grandfather       ADVANCED DIRECTIVES:    HEALTH MAINTENANCE: Social History   Tobacco Use  . Smoking status: Former Research scientist (life sciences)  . Smokeless tobacco: Current User    Types: Chew  Substance Use Topics  . Alcohol use: Yes    Alcohol/week: 1.8 oz    Types: 3 Cans of beer per week    Comment: nightly  . Drug use: No     Colonoscopy:  PAP:  Bone density:  Lipid panel:  Allergies  Allergen Reactions  . Rofecoxib Nausea Only    Current Outpatient Medications  Medication Sig Dispense Refill  . ACCU-CHEK AVIVA PLUS test strip USE AS INSTRUCTED. USE 1 STRIP EVERY 3 HOURS . ACCU-CHECK AVIVA PLUS TEST STRIP. DX E10.649  12  . carvedilol (COREG) 3.125 MG tablet Take 3.125 mg by mouth 2 (two) times daily with a meal.     . ferrous sulfate 325 (65 FE)  MG tablet Take 325 mg by mouth daily.    . furosemide (LASIX) 20 MG tablet Take 1 tablet (20 mg total) by mouth daily. 30 tablet 1  . glucose 4 GM chewable tablet Chew 1 tablet by mouth once as needed for low blood sugar.    . insulin aspart (NOVOLOG) 100 UNIT/ML injection Basal rate 12am 0.8 units per hour, 9am 0.9 units per hour. (total basal insulin 20.7 units). Carbohydrate ratio 1 units for every 8gm of carbohydrate. Correction factor 1 units for every 65m/dl over target cbg. Target CBG 80-120 1 vial 12  . levothyroxine (SYNTHROID, LEVOTHROID) 25 MCG tablet Take  25 mcg by mouth daily.    .Marland Kitchenlisinopril (PRINIVIL,ZESTRIL) 20 MG tablet Take 20 mg by mouth daily.    . Multiple Vitamin (MULTIVITAMIN) tablet Take 1 tablet by mouth daily.    . naproxen sodium (ALEVE) 220 MG tablet Take 220 mg by mouth daily as needed.    . ondansetron (ZOFRAN) 8 MG tablet Take 1 tablet (8 mg total) by mouth 2 (two) times daily as needed for refractory nausea / vomiting. 30 tablet 2  . pantoprazole (PROTONIX) 40 MG tablet TAKE 1 TABLET (40 MG TOTAL) BY MOUTH DAILY. 30 tablet 0  . pravastatin (PRAVACHOL) 40 MG tablet Take 1 tablet (40 mg total) by mouth at bedtime. Will need office visit for any more refills 30 tablet 0  . prochlorperazine (COMPAZINE) 10 MG tablet Take 1 tablet (10 mg total) by mouth every 6 (six) hours as needed (Nausea or vomiting). 60 tablet 2  . acyclovir (ZOVIRAX) 400 MG tablet Take 1 tablet (400 mg total) by mouth daily. (Patient not taking: Reported on 11/09/2017) 30 tablet 3  . lidocaine-prilocaine (EMLA) cream Apply 1 application topically as needed. Apply to port prior to chemotherapy appointment, cover with plastic wrap. 30 g 2   No current facility-administered medications for this visit.   Race: Please given the multiple things to do this would like to do a little bit for myself before do anything  OBJECTIVE: Vitals:   11/16/17 1058  BP: (!) 143/80  Pulse: 73  Resp: 20  Temp: (!) 96 F (35.6 C)     Body mass index is 36.6 kg/m.    ECOG FS:0 - Asymptomatic  General: Disheveled appearing, no acute distress. Eyes: Pink conjunctiva, anicteric sclera. Lungs: Clear to auscultation bilaterally. Heart: Regular rate and rhythm. No rubs, murmurs, or gallops. Abdomen: Soft, nontender, nondistended. No organomegaly noted, normoactive bowel sounds. Musculoskeletal: No edema, cyanosis, or clubbing. Neuro: Alert, answering all questions appropriately. Cranial nerves grossly intact. Skin: No rashes or petechiae noted. Psych: Normal affect. Lymphatics: No  cervical, calvicular, axillary or inguinal LAD.   LAB RESULTS:  Lab Results  Component Value Date   NA 139 11/16/2017   K 4.3 11/16/2017   CL 102 11/16/2017   CO2 29 11/16/2017   GLUCOSE 36 (LL) 11/16/2017   BUN 20 11/16/2017   CREATININE 0.98 11/16/2017   CALCIUM 8.0 (L) 11/16/2017   PROT 6.6 11/16/2017   ALBUMIN 3.4 (L) 11/16/2017   AST 32 11/16/2017   ALT 21 11/16/2017   ALKPHOS 207 (H) 11/16/2017   BILITOT 1.3 (H) 11/16/2017   GFRNONAA >60 11/16/2017   GFRAA >60 11/16/2017    Lab Results  Component Value Date   WBC 6.1 11/16/2017   NEUTROABS 4.8 11/16/2017   HGB 12.4 (L) 11/16/2017   HCT 38.0 (L) 11/16/2017   MCV 88.3 11/16/2017   PLT 197 11/16/2017  Lab Results  Component Value Date   IRON 13 (L) 08/11/2017   TIBC 223 (L) 01/12/2017   IRONPCTSAT 3.8 (L) 08/11/2017   Lab Results  Component Value Date   FERRITIN 42.0 08/11/2017     STUDIES:   ASSESSMENT: Low-grade mantle cell lymphoma diagnosed in September 2015, iron deficiency anemia.  PLAN:    1. Mantle cell lymphoma, stage III: Patient's initial diagnosis was on inguinal lymph node biopsy on July 12, 2014.  CT scan results from January 2019 reviewed independently and reported as above with clear evidence of progression of disease.  Patient declined bone marrow biopsy to complete the staging workup.  Although, this would not change his overall treatment plan.  Patient tolerated cycle 1, day 1 of Rituxan and Treanda last week without significant side effects.  He is receiving Rituxan on day 1 with Treanda on days 1 and 2 every 28 days for 4-6 cycles.  Return to clinic in 3 weeks for consideration of cycle 2.   2. Decreased ejection fraction: Previously patient had an ejection fraction of 40% with no clinical CHF. Monitor. 3. Diabetes: Continue insulin pump as directed. 4. Thrombocytopenia: Resolved. 5. Hypertension: Continue treatment and evaluation per PCP. 6.  Iron deficiency anemia: Patient's  hemoglobin is only mildly decreased.  Patient last received IV Feraheme on September 28, 2017.  Will consider additional IV Feraheme along with his treatment for mantle cell as above.     Patient expressed understanding and was in agreement with this plan. He also understands that He can call clinic at any time with any questions, concerns, or complaints.    Lloyd Huger, MD   11/19/2017 4:10 PM

## 2017-11-16 ENCOUNTER — Inpatient Hospital Stay (HOSPITAL_BASED_OUTPATIENT_CLINIC_OR_DEPARTMENT_OTHER): Payer: Medicare Other | Admitting: Oncology

## 2017-11-16 ENCOUNTER — Encounter: Payer: Self-pay | Admitting: Oncology

## 2017-11-16 ENCOUNTER — Inpatient Hospital Stay: Payer: Medicare Other | Attending: Oncology

## 2017-11-16 VITALS — BP 143/80 | HR 73 | Temp 96.0°F | Resp 20 | Wt 255.1 lb

## 2017-11-16 DIAGNOSIS — E7800 Pure hypercholesterolemia, unspecified: Secondary | ICD-10-CM | POA: Insufficient documentation

## 2017-11-16 DIAGNOSIS — C8310 Mantle cell lymphoma, unspecified site: Secondary | ICD-10-CM | POA: Diagnosis not present

## 2017-11-16 DIAGNOSIS — Z79899 Other long term (current) drug therapy: Secondary | ICD-10-CM

## 2017-11-16 DIAGNOSIS — E11311 Type 2 diabetes mellitus with unspecified diabetic retinopathy with macular edema: Secondary | ICD-10-CM | POA: Diagnosis not present

## 2017-11-16 DIAGNOSIS — Z794 Long term (current) use of insulin: Secondary | ICD-10-CM | POA: Insufficient documentation

## 2017-11-16 DIAGNOSIS — Z9641 Presence of insulin pump (external) (internal): Secondary | ICD-10-CM | POA: Diagnosis not present

## 2017-11-16 DIAGNOSIS — C8318 Mantle cell lymphoma, lymph nodes of multiple sites: Secondary | ICD-10-CM | POA: Diagnosis not present

## 2017-11-16 DIAGNOSIS — I10 Essential (primary) hypertension: Secondary | ICD-10-CM

## 2017-11-16 DIAGNOSIS — D509 Iron deficiency anemia, unspecified: Secondary | ICD-10-CM | POA: Insufficient documentation

## 2017-11-16 DIAGNOSIS — E1143 Type 2 diabetes mellitus with diabetic autonomic (poly)neuropathy: Secondary | ICD-10-CM

## 2017-11-16 DIAGNOSIS — G629 Polyneuropathy, unspecified: Secondary | ICD-10-CM | POA: Diagnosis not present

## 2017-11-16 DIAGNOSIS — Z87891 Personal history of nicotine dependence: Secondary | ICD-10-CM

## 2017-11-16 DIAGNOSIS — Z803 Family history of malignant neoplasm of breast: Secondary | ICD-10-CM | POA: Diagnosis not present

## 2017-11-16 DIAGNOSIS — E0500 Thyrotoxicosis with diffuse goiter without thyrotoxic crisis or storm: Secondary | ICD-10-CM

## 2017-11-16 LAB — CBC WITH DIFFERENTIAL/PLATELET
Basophils Absolute: 0 10*3/uL (ref 0–0.1)
Basophils Relative: 1 %
Eosinophils Absolute: 0.3 10*3/uL (ref 0–0.7)
Eosinophils Relative: 4 %
HEMATOCRIT: 38 % — AB (ref 40.0–52.0)
Hemoglobin: 12.4 g/dL — ABNORMAL LOW (ref 13.0–18.0)
LYMPHS ABS: 0.3 10*3/uL — AB (ref 1.0–3.6)
LYMPHS PCT: 5 %
MCH: 28.8 pg (ref 26.0–34.0)
MCHC: 32.6 g/dL (ref 32.0–36.0)
MCV: 88.3 fL (ref 80.0–100.0)
Monocytes Absolute: 0.7 10*3/uL (ref 0.2–1.0)
Monocytes Relative: 12 %
NEUTROS ABS: 4.8 10*3/uL (ref 1.4–6.5)
Neutrophils Relative %: 78 %
Platelets: 197 10*3/uL (ref 150–440)
RBC: 4.3 MIL/uL — AB (ref 4.40–5.90)
RDW: 18.2 % — ABNORMAL HIGH (ref 11.5–14.5)
WBC: 6.1 10*3/uL (ref 3.8–10.6)

## 2017-11-16 LAB — COMPREHENSIVE METABOLIC PANEL
ALBUMIN: 3.4 g/dL — AB (ref 3.5–5.0)
ALT: 21 U/L (ref 17–63)
AST: 32 U/L (ref 15–41)
Alkaline Phosphatase: 207 U/L — ABNORMAL HIGH (ref 38–126)
Anion gap: 8 (ref 5–15)
BUN: 20 mg/dL (ref 6–20)
CHLORIDE: 102 mmol/L (ref 101–111)
CO2: 29 mmol/L (ref 22–32)
Calcium: 8 mg/dL — ABNORMAL LOW (ref 8.9–10.3)
Creatinine, Ser: 0.98 mg/dL (ref 0.61–1.24)
GFR calc Af Amer: >60 mL/min (ref >60)
GLUCOSE: 36 mg/dL — AB (ref 65–99)
POTASSIUM: 4.3 mmol/L (ref 3.5–5.1)
Sodium: 139 mmol/L (ref 135–145)
Total Bilirubin: 1.3 mg/dL — ABNORMAL HIGH (ref 0.3–1.2)
Total Protein: 6.6 g/dL (ref 6.5–8.1)

## 2017-11-16 MED ORDER — LIDOCAINE-PRILOCAINE 2.5-2.5 % EX CREA: 1.0000 "application " | TOPICAL_CREAM | CUTANEOUS | 2 refills | Status: DC | PRN

## 2017-11-16 NOTE — Progress Notes (Signed)
Patient denies any concerns today.  

## 2017-11-17 ENCOUNTER — Encounter (INDEPENDENT_AMBULATORY_CARE_PROVIDER_SITE_OTHER): Payer: Self-pay | Admitting: Vascular Surgery

## 2017-11-17 ENCOUNTER — Ambulatory Visit (INDEPENDENT_AMBULATORY_CARE_PROVIDER_SITE_OTHER): Payer: Medicare Other | Admitting: Vascular Surgery

## 2017-11-17 VITALS — BP 123/56 | HR 51 | Resp 20 | Wt 263.0 lb

## 2017-11-17 DIAGNOSIS — I890 Lymphedema, not elsewhere classified: Secondary | ICD-10-CM

## 2017-11-17 DIAGNOSIS — E1151 Type 2 diabetes mellitus with diabetic peripheral angiopathy without gangrene: Secondary | ICD-10-CM | POA: Diagnosis not present

## 2017-11-17 NOTE — Progress Notes (Signed)
Subjective:    [name], male    DOB: 12/26/1945, 72 y.o.   MRN: [id number] Chief Complaint  Patient presents with  . Follow-up    unna check   Patient presents for a monthly lymphedema check / unna boot therapy follow-up.  The patient states he was supposed to receive his lymphedema sleeves this Monday however no one "showed up at his house".  The patient has not purchased a pair of medical grade one compression stockings as of yet.  The patient states he continues to elevate his legs heart level or higher as much as possible.  The patient continues to notice small improvements to the edema located to his lower extremity.  The patient denies any worsening erythema to the bilateral lower extremity.  Patient denies any new or worsening ulceration to the bilateral lower extremity.  The patient denies any fever, nausea vomiting.    Review of Systems  Constitutional: Negative.   HENT: Negative.   Eyes: Negative.   Respiratory: Negative.   Cardiovascular: Positive for leg swelling.  Gastrointestinal: Negative.   Endocrine: Negative.   Genitourinary: Negative.   Musculoskeletal: Negative.   Skin: Positive for wound.  Allergic/Immunologic: Negative.   Neurological: Negative.   Hematological: Negative.   Psychiatric/Behavioral: Negative.       Objective:   Physical Exam  Constitutional: He is oriented to person, place, and time. He appears well-developed and well-nourished. No distress.  HENT:  Head: Normocephalic and atraumatic.  Eyes: Conjunctivae are normal. Pupils are equal, round, and reactive to light.  Neck: Normal range of motion.  Cardiovascular: Normal rate, regular rhythm, normal heart sounds and intact distal pulses.  Pulses:      Radial pulses are 2+ on the right side, and 2+ on the left side.  Hard to palpate pedal pulses  Pulmonary/Chest: Effort normal and breath sounds normal.  Musculoskeletal: He exhibits edema (Moderate nonpitting edema noted  bilaterally).  Neurological: He is alert and oriented to person, place, and time.  Skin: He is not diaphoretic.  Very few scattered shallow healing well noninfected ulcerations noted to the bilateral lower extremity.  No cellulitis noted.  Psychiatric: He has a normal mood and affect. His behavior is normal. Judgment and thought content normal.  Vitals reviewed.  BP (!) 123/56 (BP Location: Right Arm)   Pulse (!) 51   Resp 20   Wt 263 lb (119.3 kg)   BMI 37.74 kg/m   Past Medical History:  Diagnosis Date  . Depression   . 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 Gastroparesis   . Graves disease   . Hypercholesterolemia   . Hypertension   . Mantle cell lymphoma ([place])   . Peripheral neuropathy    Social History   Socioeconomic History  . Marital status: Married    Spouse name: Not on file  . Number of children: 1  . Years of education: Not on file  . Highest education level: Not on file  Social Needs  . Financial resource strain: Not on file  . Food insecurity - worry: Not on file  . Food insecurity - inability: Not on file  . Transportation needs - medical: Not on file  . Transportation needs - non-medical: Not on file  Occupational History  . Not on file  Tobacco Use  . Smoking status: Former Research scientist (life sciences)  . Smokeless tobacco: Current User    Types: Chew  Substance and Sexual Activity  .  Alcohol use: Yes    Alcohol/week: 1.8 oz    Types: 3 Cans of beer per week    Comment: nightly  . Drug use: No  . Sexual activity: Not Currently  Other Topics Concern  . Not on file  Social History Narrative  . Not on file   Past Surgical History:  Procedure Laterality Date  . CHOLECYSTECTOMY    . NEPHRECTOMY     right  . PORTA CATH INSERTION N/A 11/03/2017   Procedure: PORTA CATH INSERTION;  Surgeon: Katha Cabal, MD;  Location: [place] [facility];  Service: Cardiovascular;  Laterality: N/A;   Family History    Problem Relation Age of Onset  . Breast cancer Mother   . Diabetes Father   . Cancer Father        Lymphoma  . Alcohol abuse Maternal Uncle   . Diabetes Sister   . Heart disease Unknown        grandfather   Allergies  Allergen Reactions  . Rofecoxib Nausea Only      Assessment & Plan:  Patient presents for a monthly lymphedema check / unna boot therapy follow-up.  The patient states he was supposed to receive his lymphedema sleeves this Monday however no one "showed up at his house".  The patient has not purchased a pair of medical grade one compression stockings as of yet.  The patient states he continues to elevate his legs heart level or higher as much as possible.  The patient continues to notice small improvements to the edema located to his lower extremity.  The patient denies any worsening erythema to the bilateral lower extremity.  Patient denies any new or worsening ulceration to the bilateral lower extremity.  The patient denies any fever, nausea vomiting.   1. Lymphedema - Stable Patient with some improvement to the edema is located to the bilateral lower extremity however it is not completely controlled. I encouraged the patient to call the contact information to find out where the sleeves to his lymphedema pump are The patient is also encouraged to purchase a pair of medical grade 1 compression stockings so that when his edema is controlled and his ulcerations have healed I can transition him into a pair of compression socks. The patient is to continue elevating his legs heart level or higher as much as possible The patient is to continue with 3 layer zinc oxide Unna wraps to the bilateral lower extremity changed weekly To follow-up in 1 month  2. Type 2 diabetes mellitus with diabetic peripheral angiopathy without gangrene, without long-term current use of insulin (HCC) - Stable Encouraged good control as its slows the progression of atherosclerotic disease  Current  Outpatient Medications on File Prior to Visit  Medication Sig Dispense Refill  . ACCU-CHEK AVIVA PLUS test strip USE AS INSTRUCTED. USE 1 STRIP EVERY 3 HOURS . ACCU-CHECK AVIVA PLUS TEST STRIP. DX E10.649  12  . carvedilol (COREG) 3.125 MG tablet Take 3.125 mg by mouth 2 (two) times daily with a meal.     . ferrous sulfate 325 (65 FE) MG tablet Take 325 mg by mouth daily.    . furosemide (LASIX) 20 MG tablet Take 1 tablet (20 mg total) by mouth daily. 30 tablet 1  . glucose 4 GM chewable tablet Chew 1 tablet by mouth once as needed for low blood sugar.    . insulin aspart (NOVOLOG) 100 UNIT/ML injection Basal rate 12am 0.8 units per hour, 9am 0.9 units per hour. (total  basal insulin 20.7 units). Carbohydrate ratio 1 units for every 8gm of carbohydrate. Correction factor 1 units for every 50mg /dl over target cbg. Target CBG 80-120 1 vial 12  . levothyroxine (SYNTHROID, LEVOTHROID) 25 MCG tablet Take 25 mcg by mouth daily.    Marland Kitchen lidocaine-prilocaine (EMLA) cream Apply 1 application topically as needed. Apply to port prior to chemotherapy appointment, cover with plastic wrap. 30 g 2  . lisinopril (PRINIVIL,ZESTRIL) 20 MG tablet Take 20 mg by mouth daily.    . Multiple Vitamin (MULTIVITAMIN) tablet Take 1 tablet by mouth daily.    . naproxen sodium (ALEVE) 220 MG tablet Take 220 mg by mouth daily as needed.    . ondansetron (ZOFRAN) 8 MG tablet Take 1 tablet (8 mg total) by mouth 2 (two) times daily as needed for refractory nausea / vomiting. 30 tablet 2  . pantoprazole (PROTONIX) 40 MG tablet TAKE 1 TABLET (40 MG TOTAL) BY MOUTH DAILY. 30 tablet 0  . pravastatin (PRAVACHOL) 40 MG tablet Take 1 tablet (40 mg total) by mouth at bedtime. Will need office visit for any more refills 30 tablet 0  . prochlorperazine (COMPAZINE) 10 MG tablet Take 1 tablet (10 mg total) by mouth every 6 (six) hours as needed (Nausea or vomiting). 60 tablet 2  . acyclovir (ZOVIRAX) 400 MG tablet Take 1 tablet (400 mg total) by  mouth daily. (Patient not taking: Reported on 11/09/2017) 30 tablet 3   No current facility-administered medications on file prior to visit.    There are no Patient Instructions on file for this visit. No Follow-up on file.  Thelma Viana A Yukari Flax, PA-C

## 2017-11-24 ENCOUNTER — Ambulatory Visit (INDEPENDENT_AMBULATORY_CARE_PROVIDER_SITE_OTHER): Payer: Medicare Other | Admitting: Vascular Surgery

## 2017-11-24 ENCOUNTER — Encounter (INDEPENDENT_AMBULATORY_CARE_PROVIDER_SITE_OTHER): Payer: Self-pay | Admitting: Vascular Surgery

## 2017-11-24 VITALS — BP 105/53 | HR 47 | Resp 18 | Ht 70.0 in | Wt 265.0 lb

## 2017-11-24 DIAGNOSIS — E039 Hypothyroidism, unspecified: Secondary | ICD-10-CM | POA: Diagnosis not present

## 2017-11-24 DIAGNOSIS — E10649 Type 1 diabetes mellitus with hypoglycemia without coma: Secondary | ICD-10-CM | POA: Diagnosis not present

## 2017-11-24 DIAGNOSIS — I890 Lymphedema, not elsewhere classified: Secondary | ICD-10-CM | POA: Diagnosis not present

## 2017-11-24 NOTE — Progress Notes (Signed)
History of Present Illness  There is no documented history at this time  Assessments & Plan   There are no diagnoses linked to this encounter.    Additional instructions  Subjective:  Patient presents with venous ulcer of the Bilateral lower extremity.    Procedure:  3 layer unna wrap was placed Bilateral lower extremity.   Plan:   Follow up in one week.  

## 2017-11-30 ENCOUNTER — Ambulatory Visit: Payer: Medicare Other | Admitting: Oncology

## 2017-11-30 ENCOUNTER — Ambulatory Visit: Payer: Medicare Other

## 2017-11-30 ENCOUNTER — Other Ambulatory Visit: Payer: Medicare Other

## 2017-11-30 DIAGNOSIS — E0500 Thyrotoxicosis with diffuse goiter without thyrotoxic crisis or storm: Secondary | ICD-10-CM | POA: Diagnosis not present

## 2017-11-30 DIAGNOSIS — Z4681 Encounter for fitting and adjustment of insulin pump: Secondary | ICD-10-CM | POA: Diagnosis not present

## 2017-11-30 DIAGNOSIS — E10649 Type 1 diabetes mellitus with hypoglycemia without coma: Secondary | ICD-10-CM | POA: Diagnosis not present

## 2017-11-30 DIAGNOSIS — E039 Hypothyroidism, unspecified: Secondary | ICD-10-CM | POA: Diagnosis not present

## 2017-11-30 DIAGNOSIS — E1042 Type 1 diabetes mellitus with diabetic polyneuropathy: Secondary | ICD-10-CM | POA: Diagnosis not present

## 2017-12-01 ENCOUNTER — Ambulatory Visit: Payer: Medicare Other

## 2017-12-01 ENCOUNTER — Encounter (INDEPENDENT_AMBULATORY_CARE_PROVIDER_SITE_OTHER): Payer: Self-pay

## 2017-12-01 ENCOUNTER — Ambulatory Visit (INDEPENDENT_AMBULATORY_CARE_PROVIDER_SITE_OTHER): Payer: Medicare Other | Admitting: Vascular Surgery

## 2017-12-01 VITALS — BP 134/52 | HR 46 | Resp 18 | Ht 70.0 in | Wt 263.0 lb

## 2017-12-01 DIAGNOSIS — I890 Lymphedema, not elsewhere classified: Secondary | ICD-10-CM | POA: Diagnosis not present

## 2017-12-01 NOTE — Progress Notes (Signed)
History of Present Illness  There is no documented history at this time  Assessments & Plan   There are no diagnoses linked to this encounter.    Additional instructions  Subjective:  Patient presents with venous ulcer of the Bilateral lower extremity.    Procedure:  3 layer unna wrap was placed Bilateral lower extremity.   Plan:   Follow up in one week.  

## 2017-12-02 ENCOUNTER — Inpatient Hospital Stay
Admission: EM | Admit: 2017-12-02 | Discharge: 2017-12-11 | DRG: 243 | Disposition: A | Discharge status: Home Health | Payer: Medicare Other | Attending: Family Medicine | Admitting: Family Medicine

## 2017-12-02 ENCOUNTER — Encounter: Payer: Self-pay | Admitting: Emergency Medicine

## 2017-12-02 ENCOUNTER — Other Ambulatory Visit: Payer: Self-pay

## 2017-12-02 ENCOUNTER — Emergency Department: Payer: Medicare Other

## 2017-12-02 ENCOUNTER — Encounter (INDEPENDENT_AMBULATORY_CARE_PROVIDER_SITE_OTHER): Payer: Self-pay | Admitting: Vascular Surgery

## 2017-12-02 DIAGNOSIS — I5042 Chronic combined systolic (congestive) and diastolic (congestive) heart failure: Secondary | ICD-10-CM | POA: Diagnosis present

## 2017-12-02 DIAGNOSIS — I442 Atrioventricular block, complete: Principal | ICD-10-CM | POA: Diagnosis present

## 2017-12-02 DIAGNOSIS — N179 Acute kidney failure, unspecified: Secondary | ICD-10-CM | POA: Diagnosis present

## 2017-12-02 DIAGNOSIS — R55 Syncope and collapse: Secondary | ICD-10-CM | POA: Diagnosis not present

## 2017-12-02 DIAGNOSIS — C8310 Mantle cell lymphoma, unspecified site: Secondary | ICD-10-CM | POA: Diagnosis present

## 2017-12-02 DIAGNOSIS — E11311 Type 2 diabetes mellitus with unspecified diabetic retinopathy with macular edema: Secondary | ICD-10-CM | POA: Diagnosis present

## 2017-12-02 DIAGNOSIS — E0500 Thyrotoxicosis with diffuse goiter without thyrotoxic crisis or storm: Secondary | ICD-10-CM | POA: Diagnosis present

## 2017-12-02 DIAGNOSIS — E11649 Type 2 diabetes mellitus with hypoglycemia without coma: Secondary | ICD-10-CM | POA: Diagnosis present

## 2017-12-02 DIAGNOSIS — R001 Bradycardia, unspecified: Secondary | ICD-10-CM

## 2017-12-02 DIAGNOSIS — E7800 Pure hypercholesterolemia, unspecified: Secondary | ICD-10-CM | POA: Diagnosis present

## 2017-12-02 DIAGNOSIS — E079 Disorder of thyroid, unspecified: Secondary | ICD-10-CM | POA: Diagnosis present

## 2017-12-02 DIAGNOSIS — I10 Essential (primary) hypertension: Secondary | ICD-10-CM | POA: Diagnosis present

## 2017-12-02 DIAGNOSIS — I2510 Atherosclerotic heart disease of native coronary artery without angina pectoris: Secondary | ICD-10-CM | POA: Diagnosis present

## 2017-12-02 DIAGNOSIS — I495 Sick sinus syndrome: Secondary | ICD-10-CM | POA: Diagnosis present

## 2017-12-02 DIAGNOSIS — I890 Lymphedema, not elsewhere classified: Secondary | ICD-10-CM | POA: Diagnosis present

## 2017-12-02 DIAGNOSIS — Q663 Other congenital varus deformities of feet, unspecified foot: Secondary | ICD-10-CM | POA: Diagnosis present

## 2017-12-02 DIAGNOSIS — F329 Major depressive disorder, single episode, unspecified: Secondary | ICD-10-CM | POA: Diagnosis present

## 2017-12-02 DIAGNOSIS — L03116 Cellulitis of left lower limb: Secondary | ICD-10-CM | POA: Diagnosis present

## 2017-12-02 DIAGNOSIS — M7989 Other specified soft tissue disorders: Secondary | ICD-10-CM

## 2017-12-02 DIAGNOSIS — S91312A Laceration without foreign body, left foot, initial encounter: Secondary | ICD-10-CM | POA: Diagnosis not present

## 2017-12-02 DIAGNOSIS — E119 Type 2 diabetes mellitus without complications: Secondary | ICD-10-CM

## 2017-12-02 DIAGNOSIS — S91311A Laceration without foreign body, right foot, initial encounter: Secondary | ICD-10-CM | POA: Diagnosis not present

## 2017-12-02 DIAGNOSIS — Z79899 Other long term (current) drug therapy: Secondary | ICD-10-CM

## 2017-12-02 DIAGNOSIS — E1143 Type 2 diabetes mellitus with diabetic autonomic (poly)neuropathy: Secondary | ICD-10-CM | POA: Diagnosis present

## 2017-12-02 DIAGNOSIS — L97229 Non-pressure chronic ulcer of left calf with unspecified severity: Secondary | ICD-10-CM | POA: Diagnosis present

## 2017-12-02 DIAGNOSIS — I878 Other specified disorders of veins: Secondary | ICD-10-CM | POA: Diagnosis present

## 2017-12-02 DIAGNOSIS — S91114A Laceration without foreign body of right lesser toe(s) without damage to nail, initial encounter: Secondary | ICD-10-CM | POA: Diagnosis present

## 2017-12-02 DIAGNOSIS — K219 Gastro-esophageal reflux disease without esophagitis: Secondary | ICD-10-CM | POA: Diagnosis present

## 2017-12-02 DIAGNOSIS — I110 Hypertensive heart disease with heart failure: Secondary | ICD-10-CM | POA: Diagnosis present

## 2017-12-02 DIAGNOSIS — J811 Chronic pulmonary edema: Secondary | ICD-10-CM | POA: Diagnosis not present

## 2017-12-02 DIAGNOSIS — J449 Chronic obstructive pulmonary disease, unspecified: Secondary | ICD-10-CM | POA: Diagnosis present

## 2017-12-02 DIAGNOSIS — I5022 Chronic systolic (congestive) heart failure: Secondary | ICD-10-CM | POA: Diagnosis present

## 2017-12-02 DIAGNOSIS — Z720 Tobacco use: Secondary | ICD-10-CM

## 2017-12-02 DIAGNOSIS — E039 Hypothyroidism, unspecified: Secondary | ICD-10-CM | POA: Diagnosis present

## 2017-12-02 DIAGNOSIS — E11622 Type 2 diabetes mellitus with other skin ulcer: Secondary | ICD-10-CM | POA: Diagnosis present

## 2017-12-02 DIAGNOSIS — Z9641 Presence of insulin pump (external) (internal): Secondary | ICD-10-CM | POA: Diagnosis present

## 2017-12-02 DIAGNOSIS — E669 Obesity, unspecified: Secondary | ICD-10-CM | POA: Diagnosis present

## 2017-12-02 DIAGNOSIS — N183 Chronic kidney disease, stage 3 unspecified: Secondary | ICD-10-CM | POA: Diagnosis present

## 2017-12-02 DIAGNOSIS — I429 Cardiomyopathy, unspecified: Secondary | ICD-10-CM | POA: Diagnosis present

## 2017-12-02 DIAGNOSIS — M79601 Pain in right arm: Secondary | ICD-10-CM

## 2017-12-02 DIAGNOSIS — W19XXXA Unspecified fall, initial encounter: Secondary | ICD-10-CM | POA: Diagnosis present

## 2017-12-02 DIAGNOSIS — E1151 Type 2 diabetes mellitus with diabetic peripheral angiopathy without gangrene: Secondary | ICD-10-CM | POA: Diagnosis present

## 2017-12-02 DIAGNOSIS — Z794 Long term (current) use of insulin: Secondary | ICD-10-CM

## 2017-12-02 DIAGNOSIS — Z9049 Acquired absence of other specified parts of digestive tract: Secondary | ICD-10-CM

## 2017-12-02 DIAGNOSIS — Z6838 Body mass index (BMI) 38.0-38.9, adult: Secondary | ICD-10-CM

## 2017-12-02 DIAGNOSIS — K3184 Gastroparesis: Secondary | ICD-10-CM | POA: Diagnosis present

## 2017-12-02 DIAGNOSIS — R0602 Shortness of breath: Secondary | ICD-10-CM | POA: Diagnosis not present

## 2017-12-02 DIAGNOSIS — N390 Urinary tract infection, site not specified: Secondary | ICD-10-CM | POA: Diagnosis not present

## 2017-12-02 DIAGNOSIS — E1142 Type 2 diabetes mellitus with diabetic polyneuropathy: Secondary | ICD-10-CM | POA: Diagnosis present

## 2017-12-02 DIAGNOSIS — Z950 Presence of cardiac pacemaker: Secondary | ICD-10-CM

## 2017-12-02 LAB — BASIC METABOLIC PANEL
Anion gap: 13 (ref 5–15)
BUN: 25 mg/dL — ABNORMAL HIGH (ref 6–20)
CHLORIDE: 105 mmol/L (ref 101–111)
CO2: 22 mmol/L (ref 22–32)
CREATININE: 1.55 mg/dL — AB (ref 0.61–1.24)
Calcium: 8.4 mg/dL — ABNORMAL LOW (ref 8.9–10.3)
GFR calc Af Amer: 51 mL/min — ABNORMAL LOW (ref >60)
GFR calc non Af Amer: 44 mL/min — ABNORMAL LOW (ref >60)
GLUCOSE: 294 mg/dL — AB (ref 65–99)
POTASSIUM: 4.6 mmol/L (ref 3.5–5.1)
SODIUM: 140 mmol/L (ref 135–145)

## 2017-12-02 LAB — PROTIME-INR
INR: 1.22
Prothrombin Time: 15.3 seconds — ABNORMAL HIGH (ref 11.4–15.2)

## 2017-12-02 LAB — CBC
HEMATOCRIT: 36.7 % — AB (ref 40.0–52.0)
Hemoglobin: 12.1 g/dL — ABNORMAL LOW (ref 13.0–18.0)
MCH: 29.3 pg (ref 26.0–34.0)
MCHC: 32.9 g/dL (ref 32.0–36.0)
MCV: 89 fL (ref 80.0–100.0)
PLATELETS: 94 10*3/uL — AB (ref 150–440)
RBC: 4.12 MIL/uL — ABNORMAL LOW (ref 4.40–5.90)
RDW: 16.6 % — AB (ref 11.5–14.5)
WBC: 3.3 10*3/uL — ABNORMAL LOW (ref 3.8–10.6)

## 2017-12-02 LAB — BRAIN NATRIURETIC PEPTIDE: B Natriuretic Peptide: 469 pg/mL — ABNORMAL HIGH (ref 0.0–100.0)

## 2017-12-02 LAB — TROPONIN I: Troponin I: <0.03 ng/mL (ref <0.03)

## 2017-12-02 MED ORDER — LIDOCAINE HCL (PF) 1 % IJ SOLN
INTRAMUSCULAR | Status: AC
  Administered 2017-12-02: 10 mL via INTRADERMAL
  Filled 2017-12-02: qty 10

## 2017-12-02 MED ORDER — SODIUM CHLORIDE 0.9 % IV BOLUS (SEPSIS)
500.0000 mL | Freq: Once | INTRAVENOUS | Status: AC
  Administered 2017-12-02: 500 mL via INTRAVENOUS

## 2017-12-02 MED ORDER — LIDOCAINE HCL (PF) 1 % IJ SOLN
10.0000 mL | Freq: Once | INTRAMUSCULAR | Status: AC
  Administered 2017-12-02: 10 mL via INTRADERMAL

## 2017-12-02 MED ORDER — IBUPROFEN 600 MG PO TABS
600.0000 mg | ORAL_TABLET | Freq: Once | ORAL | Status: AC
  Administered 2017-12-02: 600 mg via ORAL
  Filled 2017-12-02: qty 1

## 2017-12-02 NOTE — ED Provider Notes (Signed)
[facility] Emergency Department Provider Note ____________________________________________   First MD Initiated Contact with Patient 12/02/17 1912     (approximate)  I have reviewed the triage vital signs and the nursing notes.   HISTORY  Chief Complaint Loss of Consciousness and Laceration    HPI Gary [name] is a 72 y.o. male with past medical history as noted below including diabetes, hypertension, CAD (and CHF per the patient although not documented below) who presents with syncope, acute onset while the patient was at home, preceded by feeling lightheaded for a few moments, and associated with a laceration to the right foot.  The patient states that he awoke sitting on the floor, and does not believe he hit his head.  He reports feeling slightly more short of breath than usual recently, but denies any other recent symptoms, and states he was in his usual state of health until this afternoon when this happened.  The patient denies any other injuries.  Past Medical History:  Diagnosis Date  . Depression   . 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 Gastroparesis   . Graves disease   . Hypercholesterolemia   . Hypertension   . Mantle cell lymphoma ([place])   . Peripheral neuropathy     Patient Active Problem List   Diagnosis Date Noted  . Hypoglycemia 10/28/2017  . Goals of care, counseling/discussion 10/24/2017  . Lymphedema 10/20/2017  . Bilateral lower extremity edema 09/16/2017  . Iron deficiency anemia 09/11/2017  . Fall 08/13/2017  . Humerus fracture 08/13/2017  . Anemia 01/12/2017  . Hand laceration 12/16/2015  . Adjustment disorder with depressed mood 08/11/2015  . CAD in native artery 11/02/2014  . Swelling of left lower extremity 08/03/2014  . Mantle cell lymphoma ([place]) 08/03/2014  . History of colonic polyps 04/29/2014  . Irregular heart beat 04/22/2014  . SOB (shortness of  breath) 04/22/2014  . Stress 03/21/2014  . GERD (gastroesophageal reflux disease) 10/23/2013  . Gastroparesis 06/25/2013  . Chest pain 04/13/2013  . Thyroid disease 04/13/2013  . Diabetes ([place]) 04/13/2013  . Essential hypertension, benign 04/13/2013  . Hypercholesterolemia 04/13/2013    Past Surgical History:  Procedure Laterality Date  . CHOLECYSTECTOMY    . NEPHRECTOMY     right  . PORTA CATH INSERTION N/A 11/03/2017   Procedure: PORTA CATH INSERTION;  Surgeon: Katha Cabal, MD;  Location: [place] [facility];  Service: Cardiovascular;  Laterality: N/A;    Prior to Admission medications   Medication Sig Start Date End Date Taking? Authorizing Provider  acyclovir (ZOVIRAX) 400 MG tablet Take 1 tablet (400 mg total) by mouth daily. 10/24/17  Yes Lloyd Huger, MD  carvedilol (COREG) 3.125 MG tablet Take 3.125 mg by mouth 2 (two) times daily with a meal.  10/07/17 10/07/18 Yes [provider]  ferrous sulfate 325 (65 FE) MG tablet Take 325 mg by mouth daily.   Yes [provider]  furosemide (LASIX) 20 MG tablet Take 1 tablet (20 mg total) by mouth daily. 08/10/17 08/10/18 Yes [name], MD  GLUCAGON EMERGENCY 1 MG injection  11/30/17  Yes [provider]  glucose 4 GM chewable tablet Chew 1 tablet by mouth once as needed for low blood sugar.   Yes [provider]  insulin aspart (NOVOLOG) 100 UNIT/ML injection Basal rate 12am 0.8 units per hour, 9am 0.9 units per hour. (total basal insulin 20.7 units). Carbohydrate ratio 1 units for every  8gm of carbohydrate. Correction factor 1 units for every 50mg /dl over target cbg. Target CBG 80-120 10/29/17  Yes Wieting, Richard, MD  levothyroxine (SYNTHROID, LEVOTHROID) 25 MCG tablet Take 25 mcg by mouth daily.   Yes [provider]  lisinopril (PRINIVIL,ZESTRIL) 20 MG tablet Take 20 mg by mouth daily.   Yes [provider]  Multiple Vitamin (MULTIVITAMIN) tablet Take 1 tablet  by mouth daily.   Yes [provider]  naproxen sodium (ALEVE) 220 MG tablet Take 220 mg by mouth daily as needed.   Yes [provider]  ondansetron (ZOFRAN) 8 MG tablet Take 1 tablet (8 mg total) by mouth 2 (two) times daily as needed for refractory nausea / vomiting. 10/24/17  Yes Lloyd Huger, MD  pantoprazole (PROTONIX) 40 MG tablet TAKE 1 TABLET (40 MG TOTAL) BY MOUTH DAILY. 04/15/17  Yes [name], MD  pravastatin (PRAVACHOL) 40 MG tablet Take 1 tablet (40 mg total) by mouth at bedtime. Will need office visit for any more refills 04/15/17  Yes [name], MD  prochlorperazine (COMPAZINE) 10 MG tablet Take 1 tablet (10 mg total) by mouth every 6 (six) hours as needed (Nausea or vomiting). 10/24/17  Yes Lloyd Huger, MD  ACCU-CHEK AVIVA PLUS test strip USE AS INSTRUCTED. USE 1 STRIP EVERY 3 HOURS . ACCU-CHECK AVIVA PLUS TEST STRIP. DX D14.970 01/06/17   [provider]  lidocaine-prilocaine (EMLA) cream Apply 1 application topically as needed. Apply to port prior to chemotherapy appointment, cover with plastic wrap. 11/16/17   Lloyd Huger, MD    Allergies Rofecoxib  Family History  Problem Relation Age of Onset  . Breast cancer Mother   . Diabetes Father   . Cancer Father        Lymphoma  . Alcohol abuse Maternal Uncle   . Diabetes Sister   . Heart disease Unknown        grandfather    Social History Social History   Tobacco Use  . Smoking status: Former Research scientist (life sciences)  . Smokeless tobacco: Current User    Types: Chew  Substance Use Topics  . Alcohol use: Yes    Alcohol/week: 1.8 oz    Types: 3 Cans of beer per week    Comment: nightly  . Drug use: No    Review of Systems  Constitutional: No fever. Eyes: No redness. ENT: No sore throat. Cardiovascular: Denies chest pain. Respiratory: Positive for shortness of breath. Gastrointestinal: No nausea, no vomiting.  No diarrhea.  Genitourinary: Negative for dysuria.    Musculoskeletal: Negative for back pain. Skin: Positive for right fifth toe laceration. Neurological: Negative for headache.   ____________________________________________   PHYSICAL EXAM:  VITAL SIGNS: ED Triage Vitals  Enc Vitals Group     BP 12/02/17 1845 (!) 138/39     Pulse Rate 12/02/17 1845 (!) 44     Resp 12/02/17 1845 14     Temp 12/02/17 1845 97.8 F (36.6 C)     Temp Source 12/02/17 1845 Oral     SpO2 12/02/17 1845 94 %     Weight 12/02/17 1841 263 lb (119.3 kg)     Height 12/02/17 1841 5\' 10"  (1.778 m)     Head Circumference --      Peak Flow --      Pain Score 12/02/17 1841 3     Pain Loc --      Pain Edu? --      Excl. in [place]? --  Constitutional: Alert and oriented.  Chronically ill-appearing but in no acute distress. Eyes: Conjunctivae are normal.  EOMI.  PERRLA. Head: Atraumatic. Nose: No congestion/rhinnorhea. Mouth/Throat: Mucous membranes are somewhat dry.   Neck: Normal range of motion.  Cardiovascular: Slightly bradycardic, regular rhythm. Grossly normal heart sounds.  Good peripheral circulation. Respiratory: Normal respiratory effort.  No retractions.  Decreased breath sounds bilaterally. Gastrointestinal: Soft and nontender. No distention.  Genitourinary: No CVA tenderness. Musculoskeletal: Chronic appearing 2+ bilateral lower extremity edema.  Bilateral legs with dressings.  No purulent drainage, warmth or induration. Neurologic: Motor intact in all extremities. Skin:  Skin is warm and dry. No rash noted.  Approximately 2 cm laceration to subcutaneous tissue on the plantar aspect of the base of the right fifth toe.  Tenderness on movement of the fifth toe. Psychiatric: Mood and affect are normal. Speech and behavior are normal.  ____________________________________________   LABS (all labs ordered are listed, but only abnormal results are displayed)  Labs Reviewed  BASIC METABOLIC PANEL - Abnormal; Notable for the following components:       Result Value   Glucose, Bld 294 (*)    BUN 25 (*)    Creatinine, Ser 1.55 (*)    Calcium 8.4 (*)    GFR calc non Af Amer 44 (*)    GFR calc Af Amer 51 (*)    All other components within normal limits  CBC - Abnormal; Notable for the following components:   WBC 3.3 (*)    RBC 4.12 (*)    Hemoglobin 12.1 (*)    HCT 36.7 (*)    RDW 16.6 (*)    Platelets 94 (*)    All other components within normal limits  PROTIME-INR - Abnormal; Notable for the following components:   Prothrombin Time 15.3 (*)    All other components within normal limits  URINALYSIS, COMPLETE (UACMP) WITH MICROSCOPIC  TROPONIN I  BRAIN NATRIURETIC PEPTIDE   ____________________________________________  EKG   ED ECG REPORT I, Arta Silence, the attending physician, personally viewed and interpreted this ECG.  Date: 12/02/2017 EKG Time: 1840 Rate: 50 Rhythm: normal sinus rhythm QRS Axis: normal Intervals: RBBB, borderline prolonged PR ST/T Wave abnormalities: LVH with repolarization abnormality, nonspecific anterior T wave inversions Narrative Interpretation: no evidence of acute ischemia; no acute change when compared to EKG of 08/03/2017    ____________________________________________  RADIOLOGY  CXR: Cardiomegaly with mild interstitial edema R foot XR: no acute fracture ____________________________________________   PROCEDURES  Procedure(s) performed: No  ..Laceration Repair Date/Time: 12/02/2017 9:29 PM Performed by: Arta Silence, MD Authorized by: Arta Silence, MD   Consent:    Consent obtained:  Verbal   Consent given by:  Patient Anesthesia (see MAR for exact dosages):    Anesthesia method:  Local infiltration   Local anesthetic:  Lidocaine 1% w/o epi Laceration details:    Location:  Toe   Toe location:  R little toe   Length (cm):  2 Repair type:    Repair type:  Simple Exploration:    Wound extent: fascia violated     Wound extent: no tendon damage  noted, no underlying fracture noted and no vascular damage noted     Contaminated: yes   Treatment:    Area cleansed with:  Saline and Betadine   Amount of cleaning:  Extensive   Irrigation method:  Syringe Skin repair:    Repair method:  Sutures   Suture size:  3-0   Suture material:  Nylon   Suture  technique:  Simple interrupted   Number of sutures:  4 Approximation:    Approximation:  Close Post-procedure details:    Dressing:  Sterile dressing   Patient tolerance of procedure:  Tolerated well, no immediate complications    Critical Care performed: No ____________________________________________   INITIAL IMPRESSION / ASSESSMENT AND PLAN / ED COURSE  Pertinent labs & imaging results that were available during my care of the patient were reviewed by me and considered in my medical decision making (see chart for details).  72 year old male with past medical history as noted above presents after an apparent syncope with brief prodrome at home, with resulting fall causing a laceration underneath the right fifth toe.  Patient denies any other injuries.  He reports some slight increased shortness of breath over the last several days to weeks, but no other new symptoms.  He feels that he has returned to his baseline now.  I reviewed the past medical records in Epic; patient was admitted last month for hypoglycemia, requiring a dextrose drip.  He has had a few prior ED visits for falls.  On exam, the patient is somewhat chronically ill debilitated appearing but not acutely uncomfortable.  Vital signs are normal except for borderline low heart rate.  The remainder the exam is as described above.  There is a laceration to the right fifth toe, and there is tenderness on movement of the toe, concerning for fracture.  Overall the presentation is most consistent with vasovagal syncope, however given patient's age and comorbidities, differential also includes UTI or other infection,  hyperglycemia or other metabolic derangement, anemia, dehydration, or cardiac cause.  Given that patient is currently bradycardic and asymptomatic, the bradycardia does not appear to be the primary etiology.  Patient states he has been noted to be bradycardic before and is being worked up by his cardiologist.  Plan: Chest and right foot x-rays, basic and cardiac labs, UA, and reassess.    ----------------------------------------- 9:27 PM on 12/02/2017 -----------------------------------------  Chest x-ray and basic labs are consistent with patient's baseline.  However while I was with the patient repairing his laceration, his heart rate is now in the high 30s to low 40s consistently, even when the patient is having discomfort.  Given the fact that he syncopized with this low heart rate I will admit him.  That said, the patient is currently asymptomatic in terms of any kind of dizziness or lightheadedness.  Laceration repaired successfully and bleeding controlled.  There is no fracture.  I signed the patient out to the hospitalist, Dr. Jannifer Franklin.  ____________________________________________   FINAL CLINICAL IMPRESSION(S) / ED DIAGNOSES  Final diagnoses:  Syncope, unspecified syncope type  Bradycardia  Laceration of right foot, initial encounter      NEW MEDICATIONS STARTED DURING THIS VISIT:  New Prescriptions   No medications on file     Note:  This document was prepared using Dragon voice recognition software and may include unintentional dictation errors.    Arta Silence, MD 12/02/17 2130

## 2017-12-02 NOTE — ED Notes (Signed)
Laceration repair completed by EDP

## 2017-12-02 NOTE — ED Triage Notes (Signed)
Patient from home via [facility]. Per EMS patient was in kitchen and had a syncopal episode, landing in the floor in sitting position. Denies hitting head. Patient arrivals alert and oriented. Patient with laceration noted right foot. Bleeding controlled by pressure dressing at this time. Denies recent illness.

## 2017-12-02 NOTE — H&P (Signed)
[facility] NAME: Gary [name]    MR#:  [id number]  DATE OF BIRTH:  [birth year]  DATE OF ADMISSION:  12/02/2017  PRIMARY CARE PHYSICIAN: [name], MD   REQUESTING/REFERRING PHYSICIAN: Cherylann Banas, MD  CHIEF COMPLAINT:   Chief Complaint  Patient presents with  . Loss of Consciousness  . Laceration    HISTORY OF PRESENT ILLNESS:  Gary [name]  is a 72 y.o. male who presents with couple episodes at home and subsequent toe laceration.  Patient came to the ED for evaluation and was found to be bradycardic.  He has a history of heart failure, but no prior history of persistent bradycardia.  Toe laceration was repaired by ED physician and hospitalist were called for monitoring and further evaluation  PAST MEDICAL HISTORY:   Past Medical History:  Diagnosis Date  . Depression   . 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 Gastroparesis   . Graves disease   . Hypercholesterolemia   . Hypertension   . Mantle cell lymphoma ([place])   . Peripheral neuropathy     PAST SURGICAL HISTORY:   Past Surgical History:  Procedure Laterality Date  . CHOLECYSTECTOMY    . NEPHRECTOMY     right  . PORTA CATH INSERTION N/A 11/03/2017   Procedure: PORTA CATH INSERTION;  Surgeon: Katha Cabal, MD;  Location: [place] [facility];  Service: Cardiovascular;  Laterality: N/A;    SOCIAL HISTORY:   Social History   Tobacco Use  . Smoking status: Former Research scientist (life sciences)  . Smokeless tobacco: Current User    Types: Chew  Substance Use Topics  . Alcohol use: Yes    Alcohol/week: 1.8 oz    Types: 3 Cans of beer per week    Comment: nightly    FAMILY HISTORY:   Family History  Problem Relation Age of Onset  . Breast cancer Mother   . Diabetes Father   . Cancer Father        Lymphoma  . Alcohol abuse Maternal Uncle   . Diabetes Sister   . Heart disease Unknown        grandfather     DRUG ALLERGIES:   Allergies  Allergen Reactions  . Rofecoxib Nausea Only    MEDICATIONS AT HOME:   Prior to Admission medications   Medication Sig Start Date End Date Taking? Authorizing Provider  acyclovir (ZOVIRAX) 400 MG tablet Take 1 tablet (400 mg total) by mouth daily. 10/24/17  Yes Lloyd Huger, MD  carvedilol (COREG) 3.125 MG tablet Take 3.125 mg by mouth 2 (two) times daily with a meal.  10/07/17 10/07/18 Yes [provider]  ferrous sulfate 325 (65 FE) MG tablet Take 325 mg by mouth daily.   Yes [provider]  furosemide (LASIX) 20 MG tablet Take 1 tablet (20 mg total) by mouth daily. 08/10/17 08/10/18 Yes [name], MD  GLUCAGON EMERGENCY 1 MG injection  11/30/17  Yes [provider]  glucose 4 GM chewable tablet Chew 1 tablet by mouth once as needed for low blood sugar.   Yes [provider]  insulin aspart (NOVOLOG) 100 UNIT/ML injection Basal rate 12am 0.8 units per hour, 9am 0.9 units per hour. (total basal insulin 20.7 units). Carbohydrate ratio 1 units for every 8gm of carbohydrate. Correction factor 1 units for every 50mg /dl over target cbg. Target CBG 80-120 10/29/17  Yes Loletha Grayer, MD  levothyroxine (SYNTHROID,  LEVOTHROID) 25 MCG tablet Take 25 mcg by mouth daily.   Yes [provider]  lisinopril (PRINIVIL,ZESTRIL) 20 MG tablet Take 20 mg by mouth daily.   Yes [provider]  Multiple Vitamin (MULTIVITAMIN) tablet Take 1 tablet by mouth daily.   Yes [provider]  naproxen sodium (ALEVE) 220 MG tablet Take 220 mg by mouth daily as needed.   Yes [provider]  ondansetron (ZOFRAN) 8 MG tablet Take 1 tablet (8 mg total) by mouth 2 (two) times daily as needed for refractory nausea / vomiting. 10/24/17  Yes Lloyd Huger, MD  pantoprazole (PROTONIX) 40 MG tablet TAKE 1 TABLET (40 MG TOTAL) BY MOUTH DAILY. 04/15/17  Yes [name], MD  pravastatin (PRAVACHOL) 40 MG  tablet Take 1 tablet (40 mg total) by mouth at bedtime. Will need office visit for any more refills 04/15/17  Yes [name], MD  prochlorperazine (COMPAZINE) 10 MG tablet Take 1 tablet (10 mg total) by mouth every 6 (six) hours as needed (Nausea or vomiting). 10/24/17  Yes Lloyd Huger, MD  ACCU-CHEK AVIVA PLUS test strip USE AS INSTRUCTED. USE 1 STRIP EVERY 3 HOURS . ACCU-CHECK AVIVA PLUS TEST STRIP. DX N02.725 01/06/17   [provider]  lidocaine-prilocaine (EMLA) cream Apply 1 application topically as needed. Apply to port prior to chemotherapy appointment, cover with plastic wrap. 11/16/17   Lloyd Huger, MD    REVIEW OF SYSTEMS:  Review of Systems  Constitutional: Negative for chills, fever, malaise/fatigue and weight loss.  HENT: Negative for ear pain, hearing loss and tinnitus.   Eyes: Negative for blurred vision, double vision, pain and redness.  Respiratory: Negative for cough, hemoptysis and shortness of breath.   Cardiovascular: Negative for chest pain, palpitations, orthopnea and leg swelling.  Gastrointestinal: Negative for abdominal pain, constipation, diarrhea, nausea and vomiting.  Genitourinary: Negative for dysuria, frequency and hematuria.  Musculoskeletal: Negative for back pain, joint pain and neck pain.  Skin:       No acne, rash, or lesions  Neurological: Positive for loss of consciousness and weakness. Negative for dizziness, tremors and focal weakness.  Endo/Heme/Allergies: Negative for polydipsia. Does not bruise/bleed easily.  Psychiatric/Behavioral: Negative for depression. The patient is not nervous/anxious and does not have insomnia.      VITAL SIGNS:   Vitals:   12/02/17 2030 12/02/17 2130 12/02/17 2200 12/02/17 2230  BP: (!) 155/53 (!) 144/46 (!) 143/39 (!) 136/45  Pulse: (!) 51     Resp: 17     Temp:      TempSrc:      SpO2: 100% 100% 97%   Weight:      Height:       Wt Readings from Last 3 Encounters:  12/02/17 119.3 kg  (263 lb)  12/01/17 119.3 kg (263 lb)  11/24/17 120.2 kg (265 lb)    PHYSICAL EXAMINATION:  Physical Exam  Vitals reviewed. Constitutional: He is oriented to person, place, and time. He appears well-developed and well-nourished. No distress.  HENT:  Head: Normocephalic and atraumatic.  Mouth/Throat: Oropharynx is clear and moist.  Eyes: Conjunctivae and EOM are normal. Pupils are equal, round, and reactive to light. No scleral icterus.  Neck: Normal range of motion. Neck supple. No JVD present. No thyromegaly present.  Cardiovascular: Regular rhythm and intact distal pulses. Exam reveals no gallop and no friction rub.  No murmur heard. Bradycardic  Respiratory: Effort normal and breath sounds normal. No respiratory distress. He has no wheezes. He  has no rales.  GI: Soft. Bowel sounds are normal. He exhibits no distension. There is no tenderness.  Musculoskeletal: Normal range of motion. He exhibits no edema.  No arthritis, no gout  Lymphadenopathy:    He has no cervical adenopathy.  Neurological: He is alert and oriented to person, place, and time. No cranial nerve deficit.  No dysarthria, no aphasia  Skin: Skin is warm and dry. No rash noted. No erythema.  Psychiatric: He has a normal mood and affect. His behavior is normal. Judgment and thought content normal.    LABORATORY PANEL:   CBC Recent Labs  Lab 12/02/17 1851  WBC 3.3*  HGB 12.1*  HCT 36.7*  PLT 94*   ------------------------------------------------------------------------------------------------------------------  Chemistries  Recent Labs  Lab 12/02/17 1851  NA 140  K 4.6  CL 105  CO2 22  GLUCOSE 294*  BUN 25*  CREATININE 1.55*  CALCIUM 8.4*   ------------------------------------------------------------------------------------------------------------------  Cardiac Enzymes Recent Labs  Lab 12/02/17 1852  TROPONINI <0.03    ------------------------------------------------------------------------------------------------------------------  RADIOLOGY:  Dg Chest Portable 1 View  Result Date: 12/02/2017 CLINICAL DATA:  Syncopal episode landing in sitting position on floor. EXAM: PORTABLE CHEST 1 VIEW COMPARISON:  08/03/2017 FINDINGS: Right IJ Port-A-Cath has tip just below the cavoatrial junction. Lungs are adequately inflated with hazy perihilar opacification likely mild interstitial edema. No evidence of effusion. Mild stable cardiomegaly. Worsening displacement of known right humeral neck fracture. Remainder of the exam is unchanged. IMPRESSION: Mild cardiomegaly with mild interstitial edema. Worsening displacement of known right humeral neck fracture. Electronically Signed   By: Marin Olp M.D.   On: 12/02/2017 20:20   Dg Foot Complete Right  Result Date: 12/02/2017 CLINICAL DATA:  Right foot laceration.  Fifth toe injury. EXAM: RIGHT FOOT COMPLETE - 3+ VIEW COMPARISON:  None. FINDINGS: Initial encounter. There is extensive artifact from bandage, reportedly there is active bleeding. No opaque foreign body is seen. No definite fracture. An apparent lucency across the base of the fifth proximal phalanx is not seen in the other projections. No dislocation. IMPRESSION: Limited by artifact from bandage. No evidence of opaque foreign body. No suspected fracture. Electronically Signed   By: Monte Fantasia M.D.   On: 12/02/2017 20:19    EKG:   Orders placed or performed during the hospital encounter of 12/02/17  . EKG 12-Lead  . EKG 12-Lead  . ED EKG  . ED EKG  . EKG 12-Lead  . EKG 12-Lead    IMPRESSION AND PLAN:  Principal Problem:   Syncope -potentially related to some arrhythmia or something to do with his cardiac rhythm.  Admit to telemetry with cardiac monitoring, cardiology consult Active Problems:   Bradycardia -unclear etiology, though potentially due to his persistent heart failure versus something  like sick sinus syndrome.  Cardiology consult as above   AKI (acute kidney injury) ([place]) -gentle IV fluids tonight, avoid nephrotoxins and monitor   Diabetes ([place]) -insulin pump per home regimen   Essential hypertension, benign -continue antihypertensives except for those nodal blocking agents that he takes   CAD in native artery -continue home meds   Chronic systolic CHF (congestive heart failure) ([place]) -continue home medications   Thyroid disease -home dose thyroid replacement   GERD (gastroesophageal reflux disease) -home dose PPI  All the records are reviewed and case discussed with ED provider. Management plans discussed with the patient and/or family.  DVT PROPHYLAXIS: SubQ lovenox  GI PROPHYLAXIS: PPI  ADMISSION STATUS: Observation  CODE STATUS: Full Code Status History  Date Active Date Inactive Code Status Order ID Comments User Context   10/28/2017 17:20 10/29/2017 18:16 Full Code [id number]  Dustin Flock, MD Inpatient   01/12/2017 02:38 01/13/2017 20:44 Full Code [id number]  Hugelmeyer, Ubaldo Glassing, DO Inpatient      TOTAL TIME TAKING CARE OF THIS PATIENT: 45 minutes.   Lakaisha Danish [place] 12/02/2017, 10:56 PM  [facility]  Office  [phone]  CC: Primary care physician; [name], MD  Note:  This document was prepared using Dragon voice recognition software and may include unintentional dictation errors.

## 2017-12-02 NOTE — ED Notes (Signed)
ED Provider at bedside. 

## 2017-12-03 DIAGNOSIS — E119 Type 2 diabetes mellitus without complications: Secondary | ICD-10-CM | POA: Diagnosis not present

## 2017-12-03 DIAGNOSIS — T671XXA Heat syncope, initial encounter: Secondary | ICD-10-CM | POA: Diagnosis not present

## 2017-12-03 DIAGNOSIS — R55 Syncope and collapse: Secondary | ICD-10-CM | POA: Diagnosis not present

## 2017-12-03 DIAGNOSIS — I442 Atrioventricular block, complete: Secondary | ICD-10-CM | POA: Diagnosis not present

## 2017-12-03 DIAGNOSIS — N179 Acute kidney failure, unspecified: Secondary | ICD-10-CM | POA: Diagnosis not present

## 2017-12-03 DIAGNOSIS — R001 Bradycardia, unspecified: Secondary | ICD-10-CM | POA: Diagnosis not present

## 2017-12-03 LAB — CBC
HCT: 33.4 % — ABNORMAL LOW (ref 40.0–52.0)
Hemoglobin: 11 g/dL — ABNORMAL LOW (ref 13.0–18.0)
MCH: 29 pg (ref 26.0–34.0)
MCHC: 32.8 g/dL (ref 32.0–36.0)
MCV: 88.4 fL (ref 80.0–100.0)
PLATELETS: 91 10*3/uL — AB (ref 150–440)
RBC: 3.78 MIL/uL — AB (ref 4.40–5.90)
RDW: 16.6 % — AB (ref 11.5–14.5)
WBC: 3.6 10*3/uL — ABNORMAL LOW (ref 3.8–10.6)

## 2017-12-03 LAB — URINALYSIS, COMPLETE (UACMP) WITH MICROSCOPIC
Bacteria, UA: NONE SEEN
Bilirubin Urine: NEGATIVE
Glucose, UA: NEGATIVE mg/dL
Hgb urine dipstick: NEGATIVE
Ketones, ur: NEGATIVE mg/dL
Nitrite: NEGATIVE
Protein, ur: NEGATIVE mg/dL
SPECIFIC GRAVITY, URINE: 1.017 (ref 1.005–1.030)
pH: 5 (ref 5.0–8.0)

## 2017-12-03 LAB — TROPONIN I

## 2017-12-03 LAB — GLUCOSE, CAPILLARY
GLUCOSE-CAPILLARY: 166 mg/dL — AB (ref 65–99)
Glucose-Capillary: 158 mg/dL — ABNORMAL HIGH (ref 65–99)
Glucose-Capillary: 183 mg/dL — ABNORMAL HIGH (ref 65–99)
Glucose-Capillary: 250 mg/dL — ABNORMAL HIGH (ref 65–99)
Glucose-Capillary: 207 mg/dL — ABNORMAL HIGH (ref 65–99)

## 2017-12-03 LAB — BASIC METABOLIC PANEL
Anion gap: 10 (ref 5–15)
BUN: 27 mg/dL — ABNORMAL HIGH (ref 6–20)
CALCIUM: 8.2 mg/dL — AB (ref 8.9–10.3)
CHLORIDE: 108 mmol/L (ref 101–111)
CO2: 20 mmol/L — ABNORMAL LOW (ref 22–32)
CREATININE: 1.45 mg/dL — AB (ref 0.61–1.24)
GFR, EST AFRICAN AMERICAN: 55 mL/min — AB (ref >60)
GFR, EST NON AFRICAN AMERICAN: 48 mL/min — AB (ref >60)
Glucose, Bld: 207 mg/dL — ABNORMAL HIGH (ref 65–99)
Potassium: 4.6 mmol/L (ref 3.5–5.1)
SODIUM: 138 mmol/L (ref 135–145)

## 2017-12-03 LAB — MRSA PCR SCREENING: MRSA by PCR: NEGATIVE

## 2017-12-03 MED ORDER — ACETAMINOPHEN 650 MG RE SUPP: 650.0000 mg | Freq: Four times a day (QID) | RECTAL | Status: DC | PRN

## 2017-12-03 MED ORDER — SODIUM CHLORIDE 0.9 % IV SOLN
INTRAVENOUS | Status: DC
  Administered 2017-12-03: 17:00:00 via INTRAVENOUS

## 2017-12-03 MED ORDER — PRAVASTATIN SODIUM 40 MG PO TABS
40.0000 mg | ORAL_TABLET | Freq: Every day | ORAL | Status: DC
  Administered 2017-12-03 – 2017-12-10 (×8): 40 mg via ORAL
  Filled 2017-12-03: qty 2
  Filled 2017-12-03 (×3): qty 1
  Filled 2017-12-03 (×2): qty 2
  Filled 2017-12-03: qty 1
  Filled 2017-12-03: qty 2

## 2017-12-03 MED ORDER — FUROSEMIDE 20 MG PO TABS
20.0000 mg | ORAL_TABLET | Freq: Every day | ORAL | Status: DC
  Administered 2017-12-03: 20 mg via ORAL
  Filled 2017-12-03: qty 1

## 2017-12-03 MED ORDER — INSULIN PUMP
Freq: Three times a day (TID) | SUBCUTANEOUS | Status: DC
  Administered 2017-12-03 – 2017-12-07 (×12): via SUBCUTANEOUS
  Administered 2017-12-07 (×3): 1 via SUBCUTANEOUS
  Administered 2017-12-07: 02:00:00 via SUBCUTANEOUS
  Administered 2017-12-08: 0.3 via SUBCUTANEOUS
  Administered 2017-12-08 (×2): 2 via SUBCUTANEOUS
  Administered 2017-12-09: 1 via SUBCUTANEOUS
  Administered 2017-12-09 (×2): via SUBCUTANEOUS
  Administered 2017-12-10: 0.7 via SUBCUTANEOUS
  Administered 2017-12-10: 7.9 via SUBCUTANEOUS
  Administered 2017-12-10: 1 via SUBCUTANEOUS
  Administered 2017-12-10: 0.3 via SUBCUTANEOUS
  Administered 2017-12-10: 9.1 via SUBCUTANEOUS
  Administered 2017-12-11: 13:00:00 via SUBCUTANEOUS
  Administered 2017-12-11: 1 via SUBCUTANEOUS
  Filled 2017-12-03: qty 1

## 2017-12-03 MED ORDER — INSULIN ASPART 100 UNIT/ML ~~LOC~~ SOLN: 0.0000 [IU] | Freq: Every day | SUBCUTANEOUS | Status: DC

## 2017-12-03 MED ORDER — OXYCODONE-ACETAMINOPHEN 5-325 MG PO TABS
1.0000 | ORAL_TABLET | Freq: Four times a day (QID) | ORAL | Status: DC | PRN
  Administered 2017-12-03 – 2017-12-11 (×13): 1 via ORAL
  Filled 2017-12-03 (×13): qty 1

## 2017-12-03 MED ORDER — PANTOPRAZOLE SODIUM 40 MG PO TBEC
40.0000 mg | DELAYED_RELEASE_TABLET | Freq: Every day | ORAL | Status: DC
  Administered 2017-12-03 – 2017-12-11 (×9): 40 mg via ORAL
  Filled 2017-12-03 (×9): qty 1

## 2017-12-03 MED ORDER — ENOXAPARIN SODIUM 40 MG/0.4ML ~~LOC~~ SOLN
40.0000 mg | SUBCUTANEOUS | Status: DC
  Administered 2017-12-03 – 2017-12-10 (×7): 40 mg via SUBCUTANEOUS
  Filled 2017-12-03 (×9): qty 0.4

## 2017-12-03 MED ORDER — KETOROLAC TROMETHAMINE 30 MG/ML IJ SOLN
30.0000 mg | Freq: Four times a day (QID) | INTRAMUSCULAR | Status: AC | PRN
  Administered 2017-12-03 – 2017-12-06 (×3): 30 mg via INTRAVENOUS
  Filled 2017-12-03 (×3): qty 1

## 2017-12-03 MED ORDER — LISINOPRIL 20 MG PO TABS
20.0000 mg | ORAL_TABLET | Freq: Every day | ORAL | Status: DC
  Administered 2017-12-03 – 2017-12-11 (×7): 20 mg via ORAL
  Filled 2017-12-03 (×7): qty 1

## 2017-12-03 MED ORDER — ACETAMINOPHEN 325 MG PO TABS
650.0000 mg | ORAL_TABLET | Freq: Four times a day (QID) | ORAL | Status: DC | PRN
  Administered 2017-12-03: 650 mg via ORAL
  Filled 2017-12-03: qty 2

## 2017-12-03 MED ORDER — INSULIN ASPART 100 UNIT/ML ~~LOC~~ SOLN: 0.0000 [IU] | Freq: Three times a day (TID) | SUBCUTANEOUS | Status: DC

## 2017-12-03 MED ORDER — ACYCLOVIR 200 MG PO CAPS
400.0000 mg | ORAL_CAPSULE | Freq: Every day | ORAL | Status: DC
  Administered 2017-12-03 – 2017-12-11 (×9): 400 mg via ORAL
  Filled 2017-12-03 (×9): qty 2

## 2017-12-03 MED ORDER — ONDANSETRON HCL 4 MG/2ML IJ SOLN: 4.0000 mg | Freq: Four times a day (QID) | INTRAMUSCULAR | Status: DC | PRN

## 2017-12-03 MED ORDER — LEVOTHYROXINE SODIUM 25 MCG PO TABS
25.0000 ug | ORAL_TABLET | Freq: Every day | ORAL | Status: DC
  Administered 2017-12-03 – 2017-12-11 (×9): 25 ug via ORAL
  Filled 2017-12-03 (×9): qty 1

## 2017-12-03 MED ORDER — ONDANSETRON HCL 4 MG PO TABS: 4.0000 mg | ORAL_TABLET | Freq: Four times a day (QID) | ORAL | Status: DC | PRN

## 2017-12-03 NOTE — Consult Note (Signed)
[facility] Nurse wound consult note Reason for Consult: Wears bilateral Unnas boots. Three layer compression for edema management.    Trauma wound to right fifth metatarsal laceration, sutures intact. Covered with dry dressing.  Wound type:lymphedema to bilateral lower legs and trauma to right fifth matatarsal, plantar aspect Pressure Injury POA: NA Measurement: Chronic skin changes to bilateral lower legs.  Nonintact full thickness wound to left posterior calf. Wound bed: pink and moist Drainage (amount, consistency, odor) moderate serosanguinous  No odor Periwound: edema, erythema and chronic skin changes Dressing procedure/placement/frequency:CLeasne bilateral lower legs with soap and water.  Apply dry dressing to sutures on right fifth metatarsal.  Apply Aquacel AG to left posterior calf.  Wrap from below toes to below knee with zinc layer, next kerlix wrap and secure with self adherent Coban wrap.  Change weekly.  [place] team will follow.  Domenic Moras RN BSN [facility] Pager [phone]

## 2017-12-03 NOTE — Progress Notes (Signed)
MD Vianne Bulls was notified pt in 9/10 pain with legs. Order was to give Toradol 30 mg IV q6h prn. Pt has no further orders.

## 2017-12-03 NOTE — Progress Notes (Signed)
Pt checked his blood sugar and it was 270. Pt dosed himself 5 units of insulin. Pt states that he checks his blood sugar 6-8 times daily. Will continue to monitor

## 2017-12-03 NOTE — Consult Note (Signed)
PULMONARY / CRITICAL CARE MEDICINE   Name: Gary [name] MRN: [id number] DOB: 1945/10/24    ADMISSION DATE:  12/02/2017   CONSULTATION DATE:  12/04/2017  REFERRING MD:  Dr. Vianne Bulls  REASON: Bradycardia  HISTORY OF PRESENT ILLNESS:   This is a 72 year old male with a past medical history as indicated below who was admitted with syncope.  He was found to be bradycardic with heart rates in the 30s.  He was seen by cardiology earlier today and it was deemed that he did not need a pacemaker.  This evening patient's heart rate dropped to low 30s but his blood pressure remains in the 150s.  He is being transferred to the ICU for close monitoring.  Patient is completely asymptomatic.  Denies chest pain palpitations nausea vomiting diarrhea dizziness and headache.  PAST MEDICAL HISTORY :  He  has a past medical history of Depression, Diabetes mellitus due to underlying condition with diabetic retinopathy with macular edema, Gastroparesis, Graves disease, Hypercholesterolemia, Hypertension, Mantle cell lymphoma ([place]), and Peripheral neuropathy.  PAST SURGICAL HISTORY: He  has a past surgical history that includes Nephrectomy; Cholecystectomy; and PORTA CATH INSERTION (N/A, 11/03/2017).  Allergies  Allergen Reactions  . Rofecoxib Nausea Only    No current facility-administered medications on file prior to encounter.    Current Outpatient Medications on File Prior to Encounter  Medication Sig  . acyclovir (ZOVIRAX) 400 MG tablet Take 1 tablet (400 mg total) by mouth daily.  . carvedilol (COREG) 3.125 MG tablet Take 3.125 mg by mouth 2 (two) times daily with a meal.   . ferrous sulfate 325 (65 FE) MG tablet Take 325 mg by mouth daily.  . furosemide (LASIX) 20 MG tablet Take 1 tablet (20 mg total) by mouth daily.  Marland Kitchen GLUCAGON EMERGENCY 1 MG injection   . glucose 4 GM chewable tablet Chew 1 tablet by mouth once as needed for low blood sugar.  . insulin aspart (NOVOLOG) 100 UNIT/ML injection Basal  rate 12am 0.8 units per hour, 9am 0.9 units per hour. (total basal insulin 20.7 units). Carbohydrate ratio 1 units for every 8gm of carbohydrate. Correction factor 1 units for every 50mg /dl over target cbg. Target CBG 80-120  . levothyroxine (SYNTHROID, LEVOTHROID) 25 MCG tablet Take 25 mcg by mouth daily.  Marland Kitchen lisinopril (PRINIVIL,ZESTRIL) 20 MG tablet Take 20 mg by mouth daily.  . Multiple Vitamin (MULTIVITAMIN) tablet Take 1 tablet by mouth daily.  . naproxen sodium (ALEVE) 220 MG tablet Take 220 mg by mouth daily as needed.  . ondansetron (ZOFRAN) 8 MG tablet Take 1 tablet (8 mg total) by mouth 2 (two) times daily as needed for refractory nausea / vomiting.  . pantoprazole (PROTONIX) 40 MG tablet TAKE 1 TABLET (40 MG TOTAL) BY MOUTH DAILY.  . pravastatin (PRAVACHOL) 40 MG tablet Take 1 tablet (40 mg total) by mouth at bedtime. Will need office visit for any more refills  . prochlorperazine (COMPAZINE) 10 MG tablet Take 1 tablet (10 mg total) by mouth every 6 (six) hours as needed (Nausea or vomiting).  Danny Lawless AVIVA PLUS test strip USE AS INSTRUCTED. USE 1 STRIP EVERY 3 HOURS . ACCU-CHECK AVIVA PLUS TEST STRIP. DX R51.884  . lidocaine-prilocaine (EMLA) cream Apply 1 application topically as needed. Apply to port prior to chemotherapy appointment, cover with plastic wrap.    FAMILY HISTORY:  His indicated that his mother is deceased. He indicated that his father is deceased. He indicated that the status of his sister is unknown. He  indicated that the status of his maternal uncle is unknown. He indicated that the status of his unknown relative is unknown.   SOCIAL HISTORY: He  reports that he has quit smoking. His smokeless tobacco use includes chew. He reports that he drinks about 1.8 oz of alcohol per week. He reports that he does not use drugs.  REVIEW OF SYSTEMS:   Constitutional: Negative for fever and chills.  HENT: Negative for congestion and rhinorrhea.  Eyes: Negative for redness  and visual disturbance.  Respiratory: Negative for shortness of breath and wheezing.  Cardiovascular: Negative for chest pain and palpitations but positive for bilateral lower extremity edema.  Gastrointestinal: Negative  for nausea , vomiting and abdominal pain and  Loose stools Genitourinary: Negative for dysuria and urgency.  Endocrine: Denies polyuria, polyphagia and heat intolerance Musculoskeletal: Negative for myalgias and arthralgias.  Skin: Reports lower extremity ulceration Neurological: Negative for dizziness and headaches   SUBJECTIVE:   VITAL SIGNS: BP (!) 154/40 (BP Location: Right Arm)   Pulse (!) 41   Temp 97.6 F (36.4 C) (Oral)   Resp 18   Ht 5\' 10"  (1.778 m)   Wt 264 lb 14.4 oz (120.2 kg)   SpO2 100%   BMI 38.01 kg/m   HEMODYNAMICS:    VENTILATOR SETTINGS:    INTAKE / OUTPUT: I/O last 3 completed shifts: In: 360 [P.O.:360] Out: 325 [Urine:325]  PHYSICAL EXAMINATION: General:  NAD Neuro: Alert and oriented x4, cranial nerves intact HEENT: PERRLA, trachea midline Cardiovascular: Bradycardic, regular, S1-S2, no murmur regurg or gallop, +2 pulses bilaterally, +2 edema Lungs: Mild work of breathing, bilateral breath sounds, diminished in the bases, no wheezes Abdomen: Obese, normal bowel sounds in all 4 quadrants Musculoskeletal: No joint swelling Skin: Dry, venous stasis discoloration in bilateral lower extremities, Ace wraps in place  LABS:  BMET Recent Labs  Lab 12/02/17 1851 12/03/17 0953  NA 140 138  K 4.6 4.6  CL 105 108  CO2 22 20*  BUN 25* 27*  CREATININE 1.55* 1.45*  GLUCOSE 294* 207*    Electrolytes Recent Labs  Lab 12/02/17 1851 12/03/17 0953  CALCIUM 8.4* 8.2*    CBC Recent Labs  Lab 12/02/17 1851 12/03/17 0953  WBC 3.3* 3.6*  HGB 12.1* 11.0*  HCT 36.7* 33.4*  PLT 94* 91*    Coag's Recent Labs  Lab 12/02/17 1852  INR 1.22    Sepsis Markers No results for input(s): LATICACIDVEN, PROCALCITON, O2SATVEN in  the last 168 hours.  ABG No results for input(s): PHART, PCO2ART, PO2ART in the last 168 hours.  Liver Enzymes No results for input(s): AST, ALT, ALKPHOS, BILITOT, ALBUMIN in the last 168 hours.  Cardiac Enzymes Recent Labs  Lab 12/03/17 0244 12/03/17 0953 12/03/17 1524  TROPONINI <0.03 <0.03 <0.03    Glucose Recent Labs  Lab 12/03/17 0153 12/03/17 0751 12/03/17 1148 12/03/17 1633  GLUCAP 158* 166* 183* 250*    Imaging No results found.  STUDIES:  2D echo 04/2014 EF = 40 %   CULTURES: None  ANTIBIOTICS: None  SIGNIFICANT EVENTS: 12/02/2017: Admitted with bradycardia and syncope  LINES/TUBES: Peripheral IVs  DISCUSSION: 72 year old male presenting with syncope, bradycardia and worsening edema  ASSESSMENT Bradycardia-currently asymptomatic Syncope Lower extremity edema Peripheral vascular disease Type 2 diabetes Hypertension  PLAN Continue current treatment plan Will await cardiology recommendations and for the meantime monitor patient in the ICU. Cardiology decides that patient does not need the pacemaker, it is okay to transfer patient back to telemetry  FAMILY  -  Updates: Patient updated on current treatment plan  Gilmer Kaminsky S. [facility] ANP-BC Pulmonary and Critical Care Medicine [facility] Pager [phone] or [phone]  NB: This document was prepared using Dragon voice recognition software and may include unintentional dictation errors.    12/03/2017, 10:23 PM

## 2017-12-03 NOTE — Progress Notes (Signed)
MD was notified that pt is in 8/10 pain and tylenol is not relieving it. Order was percocet 5/325 1 tab q6h prn. No further orders at this time.

## 2017-12-03 NOTE — Progress Notes (Signed)
[facility] [place] NAME: Gary [name]    MR#:  [id number]  DATE OF BIRTH:  02-20-1946  SUBJECTIVE: Patient is admitted for syncope, found to have bradycardia.  Patient had laceration the leg because of the fal ldue to syncope.  Having severe leg pain and requesting morphine.  CHIEF COMPLAINT:   Chief Complaint  Patient presents with  . Loss of Consciousness  . Laceration   Patient told me that he is having trouble with heart rate recently. REVIEW OF SYSTEMS:    Review of Systems  Constitutional: Negative for chills and fever.  HENT: Negative for hearing loss.   Eyes: Negative for blurred vision, double vision and photophobia.  Respiratory: Positive for cough. Negative for hemoptysis and shortness of breath.   Cardiovascular: Negative for palpitations, orthopnea and leg swelling.  Gastrointestinal: Negative for abdominal pain, diarrhea and vomiting.  Genitourinary: Negative for dysuria and urgency.  Musculoskeletal: Negative for myalgias and neck pain.  Skin: Negative for rash.  Neurological: Positive for weakness. Negative for dizziness, focal weakness, seizures and headaches.  Psychiatric/Behavioral: Negative for memory loss. The patient does not have insomnia.     Nutrition: Tolerating Diet: Tolerating PT:      DRUG ALLERGIES:   Allergies  Allergen Reactions  . Rofecoxib Nausea Only    VITALS:  Blood pressure (!) 148/36, pulse (!) 38, temperature 98.3 F (36.8 C), temperature source Oral, resp. rate 18, height 5\' 10"  (1.778 m), weight 120.2 kg (264 lb 14.4 oz), SpO2 98 %.  PHYSICAL EXAMINATION:   Physical Exam  GENERAL:  72 y.o.-year-old patient lying in the bed with no acute distress.  EYES: Pupils equal, round, reactive to light and accommodation. No scleral icterus. Extraocular muscles intact.  HEENT: Head atraumatic, normocephalic. Oropharynx and nasopharynx clear.  NECK:  Supple, no jugular venous  distention. No thyroid enlargement, no tenderness.  LUNGS: Normal breath sounds bilaterally, no wheezing, rales,rhonchi or crepitation. No use of accessory muscles of respiration.  CARDIOVASCULAR: S1, S2 normal. No murmurs, rubs, or gallops.  ABDOMEN: Soft, nontender, nondistended. Bowel sounds present. No organomegaly or mass.  EXTREMITIES: Patient had laceration of the right foot NEUROLOGIC: Cranial nerves II through XII are intact. Muscle strength 5/5 in all extremities. Sensation intact. Gait not checked.  PSYCHIATRIC: The patient is alert and oriented x 3.  SKIN: Laceration repair in the emergency room now has dressing present.Marland Kitchen    LABORATORY PANEL:   CBC Recent Labs  Lab 12/03/17 0953  WBC 3.6*  HGB 11.0*  HCT 33.4*  PLT 91*   ------------------------------------------------------------------------------------------------------------------  Chemistries  Recent Labs  Lab 12/03/17 0953  NA 138  K 4.6  CL 108  CO2 20*  GLUCOSE 207*  BUN 27*  CREATININE 1.45*  CALCIUM 8.2*   ------------------------------------------------------------------------------------------------------------------  Cardiac Enzymes Recent Labs  Lab 12/03/17 0953  TROPONINI <0.03   ------------------------------------------------------------------------------------------------------------------  RADIOLOGY:  Dg Chest Portable 1 View  Result Date: 12/02/2017 CLINICAL DATA:  Syncopal episode landing in sitting position on floor. EXAM: PORTABLE CHEST 1 VIEW COMPARISON:  08/03/2017 FINDINGS: Right IJ Port-A-Cath has tip just below the cavoatrial junction. Lungs are adequately inflated with hazy perihilar opacification likely mild interstitial edema. No evidence of effusion. Mild stable cardiomegaly. Worsening displacement of known right humeral neck fracture. Remainder of the exam is unchanged. IMPRESSION: Mild cardiomegaly with mild interstitial edema. Worsening displacement of known right humeral  neck fracture. Electronically Signed   By: Marin Olp M.D.   On: 12/02/2017  20:20   Dg Foot Complete Right  Result Date: 12/02/2017 CLINICAL DATA:  Right foot laceration.  Fifth toe injury. EXAM: RIGHT FOOT COMPLETE - 3+ VIEW COMPARISON:  None. FINDINGS: Initial encounter. There is extensive artifact from bandage, reportedly there is active bleeding. No opaque foreign body is seen. No definite fracture. An apparent lucency across the base of the fifth proximal phalanx is not seen in the other projections. No dislocation. IMPRESSION: Limited by artifact from bandage. No evidence of opaque foreign body. No suspected fracture. Electronically Signed   By: Monte Fantasia M.D.   On: 12/02/2017 20:19     ASSESSMENT AND PLAN:   Principal Problem:   Syncope Active Problems:   Thyroid disease   Diabetes ([place])   Essential hypertension, benign   GERD (gastroesophageal reflux disease)   CAD in native artery   Bradycardia   AKI (acute kidney injury) ([place])   Chronic systolic CHF (congestive heart failure) ([place])   #1 syncope due to bradycardia: Coreg on hold, monitor on telemetry, patient still bradycardic with heart rate around 39 with second-degree AV block.  Type I.  Followed by cardiology Dr. call wood.  At this time monitoring the heart rate without AV nodal blocking agents and see if it improves by itself. 2.  Fall secondary to syncope, right foot laceration.  Laceration of the right fifth toe repaired by ER.  Continue Percocet and Toradol.  Unable to use morphine because of bradycardia, syncopal episode.   #3. lymphedema of both legs, patient has unna boots,  seen by wound care nurse . #4 hypothyroidism: We will continue Synthyroid.  5.  Essential hypertension: Use lisinopril at this time. 6.  Diabetes mellitus type 2: Patient is on insulin pump.,  Followed by Dr. Len Childs . 7. history of chronic diastolic heart failure, .  No history of dehydration 1 vasovagal syncope, hold Lasix,  even though patient has elevated BNP I feel that he is dehydrated more than overloaded.   All the records are reviewed and case discussed with Care Management/Social Workerr. Management plans discussed with the patient, family and they are in agreement.  CODE STATUS: Full code  TOTAL TIME TAKING CARE OF THIS PATIENT: 35 minutes.   POSSIBLE D/C IN 1-2 DAYS, DEPENDING ON CLINICAL CONDITION.   Epifanio Lesches M.D on 12/03/2017 at 3:41 PM  Between 7am to 6pm - Pager - [phone]  After 6pm go to [email or web address] - password [email or web address] [facility]  Office  [phone]  CC: Primary care physician; [name], MD

## 2017-12-03 NOTE — Consult Note (Signed)
Reason for Consult: Loss of consciousness laceration possible syncope Referring Physician: Dr. Lance Coon hospitalist Dr. [name] primary Cardiologist Dr. Onalee Hua Gary [name] is an 72 y.o. male.  HPI: Patient presents with subsequent laceration of the toe had an episode of thought to be bradycardia loss of consciousness possible syncope has history of heart failure significant leg edema denies shortness of breath or chest pain.  Patient feels much better now  Past Medical History:  Diagnosis Date  . Depression   . 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 Gastroparesis   . Graves disease   . Hypercholesterolemia   . Hypertension   . Mantle cell lymphoma ([place])   . Peripheral neuropathy     Past Surgical History:  Procedure Laterality Date  . CHOLECYSTECTOMY    . NEPHRECTOMY     right  . PORTA CATH INSERTION N/A 11/03/2017   Procedure: PORTA CATH INSERTION;  Surgeon: Katha Cabal, MD;  Location: [facility];  Service: Cardiovascular;  Laterality: N/A;    Family History  Problem Relation Age of Onset  . Breast cancer Mother   . Diabetes Father   . Cancer Father        Lymphoma  . Alcohol abuse Maternal Uncle   . Diabetes Sister   . Heart disease Unknown        grandfather    Social History:  reports that he has quit smoking. His smokeless tobacco use includes chew. He reports that he drinks about 1.8 oz of alcohol per week. He reports that he does not use drugs.  Allergies:  Allergies  Allergen Reactions  . Rofecoxib Nausea Only    Medications: I have reviewed the patient's current medications.  Results for orders placed or performed during the hospital encounter of 12/02/17 (from the past 48 hour(s))  Basic metabolic panel     Status: Abnormal   Collection Time: 12/02/17  6:51 PM  Result Value Ref Range   Sodium 140 135 - 145 mmol/L   Potassium 4.6 3.5 - 5.1 mmol/L   Chloride 105 101 -  111 mmol/L   CO2 22 22 - 32 mmol/L   Glucose, Bld 294 (H) 65 - 99 mg/dL   BUN 25 (H) 6 - 20 mg/dL   Creatinine, Ser 1.55 (H) 0.61 - 1.24 mg/dL   Calcium 8.4 (L) 8.9 - 10.3 mg/dL   GFR calc non Af Amer 44 (L) >60 mL/min   GFR calc Af Amer 51 (L) >60 mL/min    Comment: (NOTE) The eGFR has been calculated using the CKD EPI equation. This calculation has not been validated in all clinical situations. eGFR's persistently <60 mL/min signify possible Chronic Kidney Disease.    Anion gap 13 5 - 15    Comment: Performed at [facility], [address] [place], [place] [address]  CBC     Status: Abnormal   Collection Time: 12/02/17  6:51 PM  Result Value Ref Range   WBC 3.3 (L) 3.8 - 10.6 K/uL   RBC 4.12 (L) 4.40 - 5.90 MIL/uL   Hemoglobin 12.1 (L) 13.0 - 18.0 g/dL   HCT 36.7 (L) 40.0 - 52.0 %   MCV 89.0 80.0 - 100.0 fL   MCH 29.3 26.0 - 34.0 pg   MCHC 32.9 32.0 - 36.0 g/dL   RDW 16.6 (H) 11.5 - 14.5 %   Platelets 94 (L) 150 - 440 K/uL    Comment: Performed  at [facility], [address] [place], [place] [address]  Brain natriuretic peptide     Status: Abnormal   Collection Time: 12/02/17  6:51 PM  Result Value Ref Range   B Natriuretic Peptide 469.0 (H) 0.0 - 100.0 pg/mL    Comment: Performed at [facility], [address] [place], [place] [address]  Troponin I     Status: None   Collection Time: 12/02/17  6:52 PM  Result Value Ref Range   Troponin I <0.03 <0.03 ng/mL    Comment: Performed at [facility], [address] [place], [place] [address]  Protime-INR     Status: Abnormal   Collection Time: 12/02/17  6:52 PM  Result Value Ref Range   Prothrombin Time 15.3 (H) 11.4 - 15.2 seconds   INR 1.22     Comment: Performed at [facility], [address] [place], [place] [address]  Glucose, capillary     Status: Abnormal   Collection Time: 12/03/17  1:53 AM  Result Value Ref Range   Glucose-Capillary 158 (H) 65 - 99 mg/dL   Troponin I     Status: None   Collection Time: 12/03/17  2:44 AM  Result Value Ref Range   Troponin I <0.03 <0.03 ng/mL    Comment: Performed at [facility], [address] [place], [place] [address]  Glucose, capillary     Status: Abnormal   Collection Time: 12/03/17  7:51 AM  Result Value Ref Range   Glucose-Capillary 166 (H) 65 - 99 mg/dL    Dg Chest Portable 1 View  Result Date: 12/02/2017 CLINICAL DATA:  Syncopal episode landing in sitting position on floor. EXAM: PORTABLE CHEST 1 VIEW COMPARISON:  08/03/2017 FINDINGS: Right IJ Port-A-Cath has tip just below the cavoatrial junction. Lungs are adequately inflated with hazy perihilar opacification likely mild interstitial edema. No evidence of effusion. Mild stable cardiomegaly. Worsening displacement of known right humeral neck fracture. Remainder of the exam is unchanged. IMPRESSION: Mild cardiomegaly with mild interstitial edema. Worsening displacement of known right humeral neck fracture. Electronically Signed   By: Marin Olp M.D.   On: 12/02/2017 20:20   Dg Foot Complete Right  Result Date: 12/02/2017 CLINICAL DATA:  Right foot laceration.  Fifth toe injury. EXAM: RIGHT FOOT COMPLETE - 3+ VIEW COMPARISON:  None. FINDINGS: Initial encounter. There is extensive artifact from bandage, reportedly there is active bleeding. No opaque foreign body is seen. No definite fracture. An apparent lucency across the base of the fifth proximal phalanx is not seen in the other projections. No dislocation. IMPRESSION: Limited by artifact from bandage. No evidence of opaque foreign body. No suspected fracture. Electronically Signed   By: Monte Fantasia M.D.   On: 12/02/2017 20:19    Review of Systems  Constitutional: Positive for malaise/fatigue.  HENT: Positive for congestion.   Eyes: Negative.   Respiratory: Positive for shortness of breath.   Cardiovascular: Positive for orthopnea, claudication, leg swelling and PND.   Gastrointestinal: Negative.   Genitourinary: Positive for dysuria and urgency.  Musculoskeletal: Positive for myalgias.  Skin: Negative.   Neurological: Positive for dizziness, loss of consciousness and weakness.  Endo/Heme/Allergies: Negative.   Psychiatric/Behavioral: Negative.    Blood pressure (!) 148/36, pulse (!) 38, temperature 98.3 F (36.8 C), temperature source Oral, resp. rate 18, height 5' 10"  (1.778 m), weight 264 lb 14.4 oz (120.2 kg), SpO2 98 %. Physical Exam  Nursing note and vitals reviewed. Constitutional: He appears well-developed and well-nourished.  HENT:  Head: Normocephalic and atraumatic.  Eyes: Conjunctivae and EOM are normal. Pupils are equal, round, and reactive to light.  Neck: Normal range of motion. Neck supple.  Cardiovascular: Normal rate, regular rhythm and normal heart sounds.  Respiratory: Effort normal and breath sounds normal.  Diminished rhonchi Minimal basilar Rales  GI: Soft. Bowel sounds are normal.  Musculoskeletal: He exhibits edema.  Skin: Skin is warm and dry.  Psychiatric: He has a normal mood and affect.    Assessment/Plan: Loss of consciousness Syncope Bradycardia Edema Chronic venous stasis changes Shortness of breath Hypertension Diabetes type 2 Laceration toe . Plan Agree with admit to telemetry Recommend support stockings elevation Diuretic therapy Follow-up evaluate for possible syncope Consider discontinuing Coreg because of bradycardia Continue diabetes management control No clear indication for permanent pacemaker Continue lower extremity management continue wound care clinic treatment  Cordarryl Monrreal D Yula Crotwell 12/03/2017, 8:31 AM

## 2017-12-03 NOTE — Consult Note (Signed)
   [facility] CM Inpatient Consult   12/03/2017  Gary [name] June 13, 1946 [id number]  Referral received from inpatient RNCM, Nann.  Patient is currently active with [facility] for chronic disease management services with [facility].   Our community based plan of care has focused on disease management and community resource support.  Patient  will be evaluated for ongoing  assessments and disease process education.  Will follow disposition and follow up appropriately.  Of note, [facility] Care Management services does not replace or interfere with any services that are needed or arranged by inpatient case management or social work.  For additional questions or referrals please contact:  Natividad Brood, RN BSN [facility] Liaison  [phone] business mobile phone Toll free office [phone]

## 2017-12-03 NOTE — Progress Notes (Signed)
MD Callwood stated that pt is hypoxic which causes the brady. Could tx to stepdown. MD Vianne Bulls to send to stepdown for closer watching. No further orders at this time.

## 2017-12-03 NOTE — Progress Notes (Signed)
MD Vianne Bulls was notified of pt heart rate 29. Order was to call MD [place]. No further orders at this time.

## 2017-12-03 NOTE — Progress Notes (Signed)
Report called to [facility]. Waiting to be transferred to ICU 09. Will continue to monitor.

## 2017-12-03 NOTE — Progress Notes (Signed)
Inpatient Diabetes Program Recommendations  AACE/ADA: New Consensus Statement on Inpatient Glycemic Control (2015)  Target Ranges:  Prepandial:   less than 140 mg/dL      Peak postprandial:   less than 180 mg/dL (1-2 hours)      Critically ill patients:  140 - 180 mg/dL   Lab Results  Component Value Date   GLUCAP 166 (H) 12/03/2017   HGBA1C 5.6 10/28/2017    Review of Glycemic Control  Results for COLSEN, MODI (MRN [id number]) as of 12/03/2017 08:29  Ref. Range 12/03/2017 01:53 12/03/2017 07:51  Glucose-Capillary Latest Ref Range: 65 - 99 mg/dL 158 (H) 166 (H)    Has History of Type 1 DM diagnosed at age 72.  Patient has Type 1 diabetes Every one unit of insulin drops him 50 points- the 0-15 unit correction scale is too aggressive for him.   -Last saw Dr. Eddie Dibbles with [facility] on2/19/19. At that visit, changes were made to pt's insulin pump settings. They are as follows: Insulin Pump Settings Basal Rates:12am- 0.6 units/hr 9am- 0.8 units/hr Total Basal Insulin per 24 hours period=17.4units  Carbohydrate Ratio:1 unit for every 10Grams of Carbohydrates Correction/Sensitivity Factor:1 unit for every 60mg /dl above Target CBG Active insulin 4 hours Target CBG:100-130 mg/dl She did note at that visit that he had been overbolusing **  Was instructed by Dr. Eddie Dibbles to bolus BEFORE eating but he continues to check his sugar and bolus AFTER eating  Patient has no supplies with him but has instructed his wife to bring them.  He is due to change his site today  MD- please d/c Novolog 0-9 and Novolog 0-5 units  Gentry Fitz, RN, [place], [place], CDE Diabetes Coordinator Inpatient Diabetes Program  [phone] (Team Pager) [phone] ([place]) 12/03/2017 10:28 AM

## 2017-12-03 NOTE — Care Management Obs Status (Signed)
[place] NOTIFICATION   Patient Details  Name: Gary [name] MRN: [id number] Date of Birth: 1946/04/20   Medicare Observation Status Notification Given:  Yes    Katrina Stack, RN 12/03/2017, 9:50 AM

## 2017-12-03 NOTE — Care Management (Addendum)
CM consult for home health needs.  Patient placed in observation from home for syncope.  Lives with his wife.  Was found to be bradycardic.  Uses a cane mostly for ambulation but does have a walker. Denies issues getting to MD appointments or paying for meds.  When asked about home health services his response "what would I need that for."  CM will speak with patient's wife when she arrives on the unit. Home health services were declined at the time of a previous discharge 10/2017.  Current supplemental oxygen is acute. Discussed the need to wean and or perform home oxygen assessment and physical therapy consult during progression.  Per attending physical therapy evaluation at this time would be contraindicated due to syncope.  Patient is followed at local vascular clinic for weekly wrapping of lower extremities.  Patient says he no longer can get in his tub and is difficult for him to bathe.  He and his wife is agreeable to consider home health services with [facility].  Called in heads up referral for RN PT OT Aide and SW.

## 2017-12-04 ENCOUNTER — Observation Stay
Admit: 2017-12-04 | Discharge: 2017-12-04 | Disposition: A | Discharge status: Home / Self Care | Payer: Medicare Other | Attending: Internal Medicine | Admitting: Internal Medicine

## 2017-12-04 DIAGNOSIS — M79621 Pain in right upper arm: Secondary | ICD-10-CM | POA: Diagnosis not present

## 2017-12-04 DIAGNOSIS — I442 Atrioventricular block, complete: Secondary | ICD-10-CM | POA: Diagnosis present

## 2017-12-04 DIAGNOSIS — E11311 Type 2 diabetes mellitus with unspecified diabetic retinopathy with macular edema: Secondary | ICD-10-CM | POA: Diagnosis present

## 2017-12-04 DIAGNOSIS — Z9861 Coronary angioplasty status: Secondary | ICD-10-CM | POA: Diagnosis not present

## 2017-12-04 DIAGNOSIS — E1142 Type 2 diabetes mellitus with diabetic polyneuropathy: Secondary | ICD-10-CM | POA: Diagnosis present

## 2017-12-04 DIAGNOSIS — T671XXA Heat syncope, initial encounter: Secondary | ICD-10-CM | POA: Diagnosis not present

## 2017-12-04 DIAGNOSIS — L03116 Cellulitis of left lower limb: Secondary | ICD-10-CM | POA: Diagnosis present

## 2017-12-04 DIAGNOSIS — W19XXXA Unspecified fall, initial encounter: Secondary | ICD-10-CM | POA: Diagnosis present

## 2017-12-04 DIAGNOSIS — R55 Syncope and collapse: Secondary | ICD-10-CM | POA: Diagnosis not present

## 2017-12-04 DIAGNOSIS — L97229 Non-pressure chronic ulcer of left calf with unspecified severity: Secondary | ICD-10-CM | POA: Diagnosis present

## 2017-12-04 DIAGNOSIS — K3184 Gastroparesis: Secondary | ICD-10-CM | POA: Diagnosis present

## 2017-12-04 DIAGNOSIS — I110 Hypertensive heart disease with heart failure: Secondary | ICD-10-CM | POA: Diagnosis present

## 2017-12-04 DIAGNOSIS — E1151 Type 2 diabetes mellitus with diabetic peripheral angiopathy without gangrene: Secondary | ICD-10-CM | POA: Diagnosis present

## 2017-12-04 DIAGNOSIS — I5022 Chronic systolic (congestive) heart failure: Secondary | ICD-10-CM | POA: Diagnosis not present

## 2017-12-04 DIAGNOSIS — E1140 Type 2 diabetes mellitus with diabetic neuropathy, unspecified: Secondary | ICD-10-CM | POA: Diagnosis not present

## 2017-12-04 DIAGNOSIS — J9811 Atelectasis: Secondary | ICD-10-CM | POA: Diagnosis not present

## 2017-12-04 DIAGNOSIS — E11649 Type 2 diabetes mellitus with hypoglycemia without coma: Secondary | ICD-10-CM | POA: Diagnosis present

## 2017-12-04 DIAGNOSIS — K219 Gastro-esophageal reflux disease without esophagitis: Secondary | ICD-10-CM | POA: Diagnosis present

## 2017-12-04 DIAGNOSIS — M7989 Other specified soft tissue disorders: Secondary | ICD-10-CM | POA: Diagnosis not present

## 2017-12-04 DIAGNOSIS — N179 Acute kidney failure, unspecified: Secondary | ICD-10-CM | POA: Diagnosis not present

## 2017-12-04 DIAGNOSIS — I495 Sick sinus syndrome: Secondary | ICD-10-CM | POA: Diagnosis present

## 2017-12-04 DIAGNOSIS — I2510 Atherosclerotic heart disease of native coronary artery without angina pectoris: Secondary | ICD-10-CM | POA: Diagnosis not present

## 2017-12-04 DIAGNOSIS — E119 Type 2 diabetes mellitus without complications: Secondary | ICD-10-CM | POA: Diagnosis not present

## 2017-12-04 DIAGNOSIS — C8310 Mantle cell lymphoma, unspecified site: Secondary | ICD-10-CM | POA: Diagnosis present

## 2017-12-04 DIAGNOSIS — T671XXS Heat syncope, sequela: Secondary | ICD-10-CM | POA: Diagnosis not present

## 2017-12-04 DIAGNOSIS — R001 Bradycardia, unspecified: Secondary | ICD-10-CM | POA: Diagnosis not present

## 2017-12-04 DIAGNOSIS — E7800 Pure hypercholesterolemia, unspecified: Secondary | ICD-10-CM | POA: Diagnosis present

## 2017-12-04 DIAGNOSIS — S91311A Laceration without foreign body, right foot, initial encounter: Secondary | ICD-10-CM | POA: Diagnosis present

## 2017-12-04 DIAGNOSIS — N390 Urinary tract infection, site not specified: Secondary | ICD-10-CM | POA: Diagnosis present

## 2017-12-04 DIAGNOSIS — I441 Atrioventricular block, second degree: Secondary | ICD-10-CM | POA: Diagnosis not present

## 2017-12-04 DIAGNOSIS — I5042 Chronic combined systolic (congestive) and diastolic (congestive) heart failure: Secondary | ICD-10-CM | POA: Diagnosis present

## 2017-12-04 DIAGNOSIS — E0590 Thyrotoxicosis, unspecified without thyrotoxic crisis or storm: Secondary | ICD-10-CM | POA: Diagnosis not present

## 2017-12-04 DIAGNOSIS — I509 Heart failure, unspecified: Secondary | ICD-10-CM | POA: Diagnosis not present

## 2017-12-04 DIAGNOSIS — E039 Hypothyroidism, unspecified: Secondary | ICD-10-CM | POA: Diagnosis present

## 2017-12-04 DIAGNOSIS — F329 Major depressive disorder, single episode, unspecified: Secondary | ICD-10-CM | POA: Diagnosis present

## 2017-12-04 DIAGNOSIS — I429 Cardiomyopathy, unspecified: Secondary | ICD-10-CM | POA: Diagnosis present

## 2017-12-04 DIAGNOSIS — I499 Cardiac arrhythmia, unspecified: Secondary | ICD-10-CM | POA: Diagnosis not present

## 2017-12-04 DIAGNOSIS — E1143 Type 2 diabetes mellitus with diabetic autonomic (poly)neuropathy: Secondary | ICD-10-CM | POA: Diagnosis present

## 2017-12-04 DIAGNOSIS — E0500 Thyrotoxicosis with diffuse goiter without thyrotoxic crisis or storm: Secondary | ICD-10-CM | POA: Diagnosis present

## 2017-12-04 LAB — BASIC METABOLIC PANEL
ANION GAP: 8 (ref 5–15)
BUN: 28 mg/dL — ABNORMAL HIGH (ref 6–20)
CALCIUM: 8.4 mg/dL — AB (ref 8.9–10.3)
CHLORIDE: 110 mmol/L (ref 101–111)
CO2: 23 mmol/L (ref 22–32)
Creatinine, Ser: 1.59 mg/dL — ABNORMAL HIGH (ref 0.61–1.24)
GFR calc non Af Amer: 43 mL/min — ABNORMAL LOW (ref >60)
GFR, EST AFRICAN AMERICAN: 49 mL/min — AB (ref >60)
Glucose, Bld: 225 mg/dL — ABNORMAL HIGH (ref 65–99)
Potassium: 4.9 mmol/L (ref 3.5–5.1)
SODIUM: 141 mmol/L (ref 135–145)

## 2017-12-04 LAB — PHOSPHORUS: PHOSPHORUS: 3.6 mg/dL (ref 2.5–4.6)

## 2017-12-04 LAB — ECHOCARDIOGRAM COMPLETE
FS: 31 % (ref 28–44)
Height: 70 in
IVS/LV PW RATIO, ED: 1.13
LA ID, A-P, ES: 43 mm
LA diam index: 1.73 cm/m2
LA vol A4C: 74.2 ml
LA vol index: 27.4 mL/m2
LA vol: 68.1 mL
LEFT ATRIUM END SYS DIAM: 43 mm
PW: 10.6 mm — AB (ref 0.6–1.1)
RV LATERAL S' VELOCITY: 10.5 cm/s
RV TAPSE: 12.9 mm
Weight: 4238.41 oz

## 2017-12-04 LAB — GLUCOSE, CAPILLARY
GLUCOSE-CAPILLARY: 190 mg/dL — AB (ref 65–99)
GLUCOSE-CAPILLARY: 84 mg/dL (ref 65–99)
Glucose-Capillary: 176 mg/dL — ABNORMAL HIGH (ref 65–99)
Glucose-Capillary: 210 mg/dL — ABNORMAL HIGH (ref 65–99)
Glucose-Capillary: 168 mg/dL — ABNORMAL HIGH (ref 65–99)
Glucose-Capillary: 57 mg/dL — ABNORMAL LOW (ref 65–99)
Glucose-Capillary: 70 mg/dL (ref 65–99)
Glucose-Capillary: 71 mg/dL (ref 65–99)

## 2017-12-04 LAB — MAGNESIUM: MAGNESIUM: 2 mg/dL (ref 1.7–2.4)

## 2017-12-04 MED ORDER — ORAL CARE MOUTH RINSE
15.0000 mL | Freq: Two times a day (BID) | OROMUCOSAL | Status: DC
  Administered 2017-12-04 – 2017-12-10 (×10): 15 mL via OROMUCOSAL

## 2017-12-04 NOTE — Progress Notes (Signed)
Patient is alert and oriented. Currently eating his dinner. Dr. Clayborn Bigness expressed that patient may need pacemaker placed Monday morning if HR continues to stay in the 30-40's. Dr. Clayborn Bigness said there is no need for beside pacing since patient is mentating. Patient does not complain of chest pain or discomfort. Urine output is adequate. He can become SOB with exertion. Currently on 3L [place]. BP stable

## 2017-12-04 NOTE — Plan of Care (Signed)
A&O patient, transferred from telemetry for bradycardia with hypoxia. Pt denies pain this shift. Pt has insulin pump from home that administers insulin according to FSBS. BL lower extremities are wrapped with Una boots, per patient the wound care RN wrapped both lower extremities the morning of 12/03/17. Pt has been SB on the monitor, HR as low as 29 (MD and NP aware), sustaining in the 30-40's. Continue to monitor.

## 2017-12-04 NOTE — Progress Notes (Signed)
Pt's XUXY 33 recheck is 84. Pt is asymptomatic. Pt has personal insulin pump from home and states that he needs a snack. Maggie, NP at bedside and aware. Patient given snack and will recheck. Continue to monitor closely.

## 2017-12-04 NOTE — Progress Notes (Signed)
[place] [facility] [place] NAME: Gary [name]    MR#:  [id number]  DATE OF BIRTH:  13-Jul-1946  CHIEF COMPLAINT:   Chief Complaint  Patient presents with  . Loss of Consciousness  . Laceration   Heart rate continues to be very low complains of some pain in the leg REVIEW OF SYSTEMS:    Review of Systems  Constitutional: Negative for chills and fever.  HENT: Negative for hearing loss.   Eyes: Negative for blurred vision, double vision and photophobia.  Respiratory: Positive for cough. Negative for hemoptysis and shortness of breath.   Cardiovascular: Negative for palpitations, orthopnea and leg swelling.  Gastrointestinal: Negative for abdominal pain, diarrhea and vomiting.  Genitourinary: Negative for dysuria and urgency.  Musculoskeletal: Negative for myalgias and neck pain.  Skin: Negative for rash.  Neurological: Positive for weakness. Negative for dizziness, focal weakness, seizures and headaches.  Psychiatric/Behavioral: Negative for memory loss. The patient does not have insomnia.     Nutrition: Tolerating Diet: Tolerating PT:      DRUG ALLERGIES:   Allergies  Allergen Reactions  . Rofecoxib Nausea Only    VITALS:  Blood pressure (!) 140/57, pulse (!) 42, temperature 97.8 F (36.6 C), temperature source Oral, resp. rate 17, height 5\' 10"  (1.778 m), weight 264 lb 14.4 oz (120.2 kg), SpO2 100 %.  PHYSICAL EXAMINATION:   Physical Exam  GENERAL:  72 y.o.-year-old patient lying in the bed with no acute distress.  EYES: Pupils equal, round, reactive to light and accommodation. No scleral icterus. Extraocular muscles intact.  HEENT: Head atraumatic, normocephalic. Oropharynx and nasopharynx clear.  NECK:  Supple, no jugular venous distention. No thyroid enlargement, no tenderness.  LUNGS: Normal breath sounds bilaterally, no wheezing, rales,rhonchi or crepitation. No use of accessory muscles of respiration.   CARDIOVASCULAR: S1, S2 normal. No murmurs, rubs, or gallops.  ABDOMEN: Soft, nontender, nondistended. Bowel sounds present. No organomegaly or mass.  EXTREMITIES: Patient had laceration of the right foot NEUROLOGIC: Cranial nerves II through XII are intact. Muscle strength 5/5 in all extremities. Sensation intact. Gait not checked.  PSYCHIATRIC: The patient is alert and oriented x 3.  SKIN: Laceration repair in the emergency room now has dressing present.Marland Kitchen    LABORATORY PANEL:   CBC Recent Labs  Lab 12/03/17 0953  WBC 3.6*  HGB 11.0*  HCT 33.4*  PLT 91*   ------------------------------------------------------------------------------------------------------------------  Chemistries  Recent Labs  Lab 12/04/17 0542  NA 141  K 4.9  CL 110  CO2 23  GLUCOSE 225*  BUN 28*  CREATININE 1.59*  CALCIUM 8.4*  MG 2.0   ------------------------------------------------------------------------------------------------------------------  Cardiac Enzymes Recent Labs  Lab 12/03/17 1524  TROPONINI <0.03   ------------------------------------------------------------------------------------------------------------------  RADIOLOGY:  Dg Chest Portable 1 View  Result Date: 12/02/2017 CLINICAL DATA:  Syncopal episode landing in sitting position on floor. EXAM: PORTABLE CHEST 1 VIEW COMPARISON:  08/03/2017 FINDINGS: Right IJ Port-A-Cath has tip just below the cavoatrial junction. Lungs are adequately inflated with hazy perihilar opacification likely mild interstitial edema. No evidence of effusion. Mild stable cardiomegaly. Worsening displacement of known right humeral neck fracture. Remainder of the exam is unchanged. IMPRESSION: Mild cardiomegaly with mild interstitial edema. Worsening displacement of known right humeral neck fracture. Electronically Signed   By: Marin Olp M.D.   On: 12/02/2017 20:20   Dg Foot Complete Right  Result Date: 12/02/2017 CLINICAL DATA:  Right foot  laceration.  Fifth toe injury. EXAM: RIGHT FOOT COMPLETE - 3+  VIEW COMPARISON:  None. FINDINGS: Initial encounter. There is extensive artifact from bandage, reportedly there is active bleeding. No opaque foreign body is seen. No definite fracture. An apparent lucency across the base of the fifth proximal phalanx is not seen in the other projections. No dislocation. IMPRESSION: Limited by artifact from bandage. No evidence of opaque foreign body. No suspected fracture. Electronically Signed   By: Monte Fantasia M.D.   On: 12/02/2017 20:19     ASSESSMENT AND PLAN:     #1 syncope due to bradycardia: Coreg on hold,  Based on his telemetry findings he has sick sinus syndrome and will need pacemaker Cardiology following  #2.  Fall secondary to syncope, right foot laceration.  Laceration of the right fifth toe repaired by ER.  Continue Percocet and Toradol.  Unable to use morphine because of bradycardia, syncopal episode.   #3. lymphedema of both legs, patient has unna boots,  seen by wound care nurse . #4 hypothyroidism: We will continue Synthyroid.  #5.  Essential hypertension: Use lisinopril at this time.  Can use IV hydralazine as needed   # 6.  Diabetes mellitus type 2: Patient is on insulin pump.,  Followed by Dr. Len Childs . #7. history of chronic diastolic heart failure, .  Stable   All the records are reviewed and case discussed with Care Management/Social Workerr. Management plans discussed with the patient, family and they are in agreement.  CODE STATUS: Full code  TOTAL TIME TAKING CARE OF THIS PATIENT: 35 minutes.   POSSIBLE D/C IN 1-2 DAYS, DEPENDING ON CLINICAL CONDITION.   Dustin Flock M.D on 12/04/2017 at 4:35 PM  Between 7am to 6pm - Pager - [phone]  After 6pm go to [email or web address] - password [email or web address] [facility]  Office  [phone]  CC: Primary care physician; [name], MD

## 2017-12-04 NOTE — Progress Notes (Signed)
Patient received to ICU bed 9. Pt is A&O x'3, confused to time/date. Pt denies pain at present. BL lower extremities wrapped UNA boots, per patient wound care RN wrapped this morning. HR 30-40's, MD aware. Continue to monitor.

## 2017-12-05 LAB — GLUCOSE, CAPILLARY
GLUCOSE-CAPILLARY: 144 mg/dL — AB (ref 65–99)
GLUCOSE-CAPILLARY: 168 mg/dL — AB (ref 65–99)
Glucose-Capillary: 106 mg/dL — ABNORMAL HIGH (ref 65–99)
Glucose-Capillary: 116 mg/dL — ABNORMAL HIGH (ref 65–99)
Glucose-Capillary: 179 mg/dL — ABNORMAL HIGH (ref 65–99)

## 2017-12-05 MED ORDER — SODIUM CHLORIDE 0.9 % IV SOLN
1.0000 g | INTRAVENOUS | Status: DC
  Administered 2017-12-05: 1 g via INTRAVENOUS
  Filled 2017-12-05 (×2): qty 10

## 2017-12-05 NOTE — Progress Notes (Signed)
Notified Maggie, NP of 3.02 second pause. Pt denies pain at present. No new orders at present. VSS, continue to monitor.

## 2017-12-05 NOTE — Progress Notes (Signed)
Pt's FSBS 70 recheck 71. Pt continues to snack on cracker and peanut butter provided previously. Will continue to monitor closely.

## 2017-12-05 NOTE — Progress Notes (Signed)
  [facility] Progess Note  ASSESSMENT/PLAN   Bradycardia. Being followed by cardiology. Pending disposition regarding treatment options. Some telemetry rhythms appear to be complete heart block or others just bradycardia without clear P-wave morphology. Narrow complex escape rhythm  History of mantle cell lymphoma. Port in place on chemotherapy  Renal insufficiency   Diabetes. On basal rate with coverage  Hypercholesterolemia. On statin  Hypertension. On lisinopril  Peripheral neuropathy   INTAKE / OUTPUT:  Intake/Output Summary (Last 24 hours) at 12/05/2017 0920 Last data filed at 12/05/2017 0515 Gross per 24 hour  Intake -  Output 700 ml  Net -700 ml    Name: Gary [name] MRN: [id number] DOB: [birth year]    ADMISSION DATE:  12/02/2017  SUBJECTIVE:   Patient resting comfortably. Appears to be in heart block on monitor. States he only becomes symptomatic if he exerts himself  VITAL SIGNS: Temp:  [97.8 F (36.6 C)-98.7 F (37.1 C)] 98.2 F (36.8 C) (02/24 0730) Pulse Rate:  [29-103] 31 (02/24 0900) Resp:  [8-22] 15 (02/24 0900) BP: (109-165)/(35-66) 115/38 (02/24 0900) SpO2:  [98 %-100 %] 100 % (02/24 0900)  PHYSICAL EXAMINATION: Physical Examination:   VS: BP (!) 115/38   Pulse (!) 31   Temp 98.2 F (36.8 C) (Oral)   Resp 15   Ht 5\' 10"  (1.778 m)   Wt 120.2 kg (264 lb 14.4 oz)   SpO2 100%   BMI 38.01 kg/m   General Appearance: No distress  Neuro:without focal findings, mental status normal. HEENT: PERRLA, EOM intact. Pulmonary: normal breath sounds   Cardiovascular bradycardia with ventricular response in the 30s Abdomen: Benign, Soft, non-tender. Skin:   warm, no rashes, no ecchymosis  Extremities: normal, no cyanosis, clubbing.    LABORATORY PANEL:   CBC Recent Labs  Lab 12/03/17 0953  WBC 3.6*  HGB 11.0*  HCT 33.4*  PLT 91*    Chemistries  Recent Labs  Lab 12/04/17 0542  NA 141  K 4.9  CL 110  CO2 23    GLUCOSE 225*  BUN 28*  CREATININE 1.59*  CALCIUM 8.4*  MG 2.0  PHOS 3.6    Recent Labs  Lab 12/04/17 2124 12/04/17 2126 12/04/17 2238 12/04/17 2239 12/05/17 0213 12/05/17 0729  GLUCAP 57* 84 70 71 106* 116*   No results for input(s): PHART, PCO2ART, PO2ART in the last 168 hours. No results for input(s): AST, ALT, ALKPHOS, BILITOT, ALBUMIN in the last 168 hours.  Cardiac Enzymes Recent Labs  Lab 12/03/17 1524  TROPONINI <0.03    RADIOLOGY:  No results found.   Hermelinda Dellen, DO 12/05/2017

## 2017-12-05 NOTE — Progress Notes (Deleted)
[place]  [phone] [phone]  ID: Gary [name] OB: 31-Jul-1946  MR#: [id number]  QIO#:[id number]  Patient Care Team: [name], MD as PCP - General (Internal Medicine) Pleasant, Eppie Gibson, RN as [place] Management  CHIEF COMPLAINT: Mantle cell lymphoma.  INTERVAL HISTORY: Patient returns returns clinic today for further evaluation and to assess his toleration of of cycle 1, day 1 of Rituxan and Treanda.  He tolerated his treatment well without significant side effects.  He continues to feel well and is asymptomatic. He denies any fevers, chills, or night sweats. He has a good appetite and denies weight loss. He has no neurologic complaints. He denies any chest pain or shortness of breath. He has no nausea, vomiting, constipation, or diarrhea.  He denies any melena or hematochezia.  He has no urinary complaints. Patient offers no specific complaints today.  REVIEW OF SYSTEMS:   Review of Systems  Constitutional: Negative for diaphoresis, fever, malaise/fatigue and weight loss.  Respiratory: Negative.  Negative for cough and shortness of breath.   Cardiovascular: Negative.  Negative for chest pain and leg swelling.  Gastrointestinal: Negative.  Negative for abdominal pain, blood in stool and melena.  Genitourinary: Negative.  Negative for hematuria.  Musculoskeletal: Negative.  Negative for falls.  Skin: Negative.  Negative for rash.  Neurological: Negative.  Negative for sensory change and weakness.  Psychiatric/Behavioral: Negative.  The patient is not nervous/anxious.     As per HPI. Otherwise, a complete review of systems is negative.  PAST MEDICAL HISTORY: Past Medical History:  Diagnosis Date  . Depression   . 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 Gastroparesis   . Graves disease   . Hypercholesterolemia   . Hypertension   . Mantle cell lymphoma  ([place])   . Peripheral neuropathy     PAST SURGICAL HISTORY: Past Surgical History:  Procedure Laterality Date  . CHOLECYSTECTOMY    . NEPHRECTOMY     right  . PORTA CATH INSERTION N/A 11/03/2017   Procedure: PORTA CATH INSERTION;  Surgeon: Katha Cabal, MD;  Location: [facility];  Service: Cardiovascular;  Laterality: N/A;    FAMILY HISTORY Family History  Problem Relation Age of Onset  . Breast cancer Mother   . Diabetes Father   . Cancer Father        Lymphoma  . Alcohol abuse Maternal Uncle   . Diabetes Sister   . Heart disease Unknown        grandfather       ADVANCED DIRECTIVES:    HEALTH MAINTENANCE: Social History   Tobacco Use  . Smoking status: Former Research scientist (life sciences)  . Smokeless tobacco: Current User    Types: Chew  Substance Use Topics  . Alcohol use: Yes    Alcohol/week: 1.8 oz    Types: 3 Cans of beer per week    Comment: nightly  . Drug use: No     Colonoscopy:  PAP:  Bone density:  Lipid panel:  Allergies  Allergen Reactions  . Rofecoxib Nausea Only    No current facility-administered medications for this visit.    No current outpatient medications on file.   Facility-Administered Medications Ordered in Other Visits  Medication Dose Route Frequency Provider Last Rate Last Dose  . acetaminophen (TYLENOL) tablet 650 mg  650 mg Oral Q6H PRN Lance Coon, MD   650 mg at 12/03/17 0320   Or  . acetaminophen (  TYLENOL) suppository 650 mg  650 mg Rectal Q6H PRN Lance Coon, MD      . acyclovir (ZOVIRAX) 200 MG capsule 400 mg  400 mg Oral Daily Lance Coon, MD   400 mg at 12/04/17 1023  . enoxaparin (LOVENOX) injection 40 mg  40 mg Subcutaneous Q24H Lance Coon, MD   40 mg at 12/04/17 2117  . insulin pump   Subcutaneous TID AC, HS, 0200 Lance Coon, MD      . ketorolac (TORADOL) 30 MG/ML injection 30 mg  30 mg Intravenous Q6H PRN Epifanio Lesches, MD   30 mg at 12/03/17 1145  . levothyroxine (SYNTHROID, LEVOTHROID) tablet 25  mcg  25 mcg Oral QAC breakfast Lance Coon, MD   25 mcg at 12/05/17 [phone]  . lisinopril (PRINIVIL,ZESTRIL) tablet 20 mg  20 mg Oral Daily Lance Coon, MD   20 mg at 12/03/17 0925  . MEDLINE mouth rinse  15 mL Mouth Rinse BID Dustin Flock, MD   15 mL at 12/04/17 2250  . ondansetron (ZOFRAN) tablet 4 mg  4 mg Oral Q6H PRN Lance Coon, MD       Or  . ondansetron [facility]) injection 4 mg  4 mg Intravenous Q6H PRN Lance Coon, MD      . oxyCODONE-acetaminophen (PERCOCET/ROXICET) 5-325 MG per tablet 1 tablet  1 tablet Oral Q6H PRN Epifanio Lesches, MD   1 tablet at 12/05/17 0500  . pantoprazole (PROTONIX) EC tablet 40 mg  40 mg Oral Daily Lance Coon, MD   40 mg at 12/04/17 1022  . pravastatin (PRAVACHOL) tablet 40 mg  40 mg Oral Corwin Levins, MD   40 mg at 12/04/17 2118  Race: Please given the multiple things to do this would like to do a little bit for myself before do anything  OBJECTIVE: There were no vitals filed for this visit.   There is no height or weight on file to calculate BMI.    ECOG FS:0 - Asymptomatic  General: Disheveled appearing, no acute distress. Eyes: Pink conjunctiva, anicteric sclera. Lungs: Clear to auscultation bilaterally. Heart: Regular rate and rhythm. No rubs, murmurs, or gallops. Abdomen: Soft, nontender, nondistended. No organomegaly noted, normoactive bowel sounds. Musculoskeletal: No edema, cyanosis, or clubbing. Neuro: Alert, answering all questions appropriately. Cranial nerves grossly intact. Skin: No rashes or petechiae noted. Psych: Normal affect. Lymphatics: No cervical, calvicular, axillary or inguinal LAD.   LAB RESULTS:  Lab Results  Component Value Date   NA 141 12/04/2017   K 4.9 12/04/2017   CL 110 12/04/2017   CO2 23 12/04/2017   GLUCOSE 225 (H) 12/04/2017   BUN 28 (H) 12/04/2017   CREATININE 1.59 (H) 12/04/2017   CALCIUM 8.4 (L) 12/04/2017   PROT 6.6 11/16/2017   ALBUMIN 3.4 (L) 11/16/2017   AST 32 11/16/2017   ALT  21 11/16/2017   ALKPHOS 207 (H) 11/16/2017   BILITOT 1.3 (H) 11/16/2017   GFRNONAA 43 (L) 12/04/2017   GFRAA 49 (L) 12/04/2017    Lab Results  Component Value Date   WBC 3.6 (L) 12/03/2017   NEUTROABS 4.8 11/16/2017   HGB 11.0 (L) 12/03/2017   HCT 33.4 (L) 12/03/2017   MCV 88.4 12/03/2017   PLT 91 (L) 12/03/2017   Lab Results  Component Value Date   IRON 13 (L) 08/11/2017   TIBC 223 (L) 01/12/2017   IRONPCTSAT 3.8 (L) 08/11/2017   Lab Results  Component Value Date   FERRITIN 42.0 08/11/2017     STUDIES:  ASSESSMENT: Low-grade mantle cell lymphoma diagnosed in September 2015, iron deficiency anemia.  PLAN:    1. Mantle cell lymphoma, stage III: Patient's initial diagnosis was on inguinal lymph node biopsy on July 12, 2014.  CT scan results from January 2019 reviewed independently and reported as above with clear evidence of progression of disease.  Patient declined bone marrow biopsy to complete the staging workup.  Although, this would not change his overall treatment plan.  Patient tolerated cycle 1, day 1 of Rituxan and Treanda last week without significant side effects.  He is receiving Rituxan on day 1 with Treanda on days 1 and 2 every 28 days for 4-6 cycles.  Return to clinic in 3 weeks for consideration of cycle 2.   2. Decreased ejection fraction: Previously patient had an ejection fraction of 40% with no clinical CHF. Monitor. 3. Diabetes: Continue insulin pump as directed. 4. Thrombocytopenia: Resolved. 5. Hypertension: Continue treatment and evaluation per PCP. 6.  Iron deficiency anemia: Patient's hemoglobin is only mildly decreased.  Patient last received IV Feraheme on September 28, 2017.  Will consider additional IV Feraheme along with his treatment for mantle cell as above.     Patient expressed understanding and was in agreement with this plan. He also understands that He can call clinic at any time with any questions, concerns, or complaints.     Lloyd Huger, MD   12/05/2017 9:11 AM

## 2017-12-05 NOTE — Progress Notes (Signed)
[name] [facility] [place] NAME: Gary [name]    MR#:  [id number]  DATE OF BIRTH:  10-18-1945  CHIEF COMPLAINT:   Chief Complaint  Patient presents with  . Loss of Consciousness  . Laceration   Patient's heart rates continues to be very low REVIEW OF SYSTEMS:    Review of Systems  Constitutional: Negative for chills and fever.  HENT: Negative for hearing loss.   Eyes: Negative for blurred vision, double vision and photophobia.  Respiratory: Positive for cough. Negative for hemoptysis and shortness of breath.   Cardiovascular: Negative for palpitations, orthopnea and leg swelling.  Gastrointestinal: Negative for abdominal pain, diarrhea and vomiting.  Genitourinary: Negative for dysuria and urgency.  Musculoskeletal: Negative for myalgias and neck pain.  Skin: Negative for rash.  Neurological: Positive for weakness. Negative for dizziness, focal weakness, seizures and headaches.  Psychiatric/Behavioral: Negative for memory loss. The patient does not have insomnia.     Nutrition: Tolerating Diet: Tolerating PT:      DRUG ALLERGIES:   Allergies  Allergen Reactions  . Rofecoxib Nausea Only    VITALS:  Blood pressure (!) 156/53, pulse (!) 48, temperature 98 F (36.7 C), temperature source Oral, resp. rate 15, height 5\' 10"  (1.778 m), weight 264 lb 14.4 oz (120.2 kg), SpO2 100 %.  PHYSICAL EXAMINATION:   Physical Exam  GENERAL:  72 y.o.-year-old patient lying in the bed with no acute distress.  EYES: Pupils equal, round, reactive to light and accommodation. No scleral icterus. Extraocular muscles intact.  HEENT: Head atraumatic, normocephalic. Oropharynx and nasopharynx clear.  NECK:  Supple, no jugular venous distention. No thyroid enlargement, no tenderness.  LUNGS: Normal breath sounds bilaterally, no wheezing, rales,rhonchi or crepitation. No use of accessory muscles of respiration.  CARDIOVASCULAR: S1, S2 normal. No  murmurs, rubs, or gallops.  ABDOMEN: Soft, nontender, nondistended. Bowel sounds present. No organomegaly or mass.  EXTREMITIES: Patient had laceration of the right foot NEUROLOGIC: Cranial nerves II through XII are intact. Muscle strength 5/5 in all extremities. Sensation intact. Gait not checked.  PSYCHIATRIC: The patient is alert and oriented x 3.  SKIN: Laceration repair in the emergency room now has dressing present.Marland Kitchen    LABORATORY PANEL:   CBC Recent Labs  Lab 12/03/17 0953  WBC 3.6*  HGB 11.0*  HCT 33.4*  PLT 91*   ------------------------------------------------------------------------------------------------------------------  Chemistries  Recent Labs  Lab 12/04/17 0542  NA 141  K 4.9  CL 110  CO2 23  GLUCOSE 225*  BUN 28*  CREATININE 1.59*  CALCIUM 8.4*  MG 2.0   ------------------------------------------------------------------------------------------------------------------  Cardiac Enzymes Recent Labs  Lab 12/03/17 1524  TROPONINI <0.03   ------------------------------------------------------------------------------------------------------------------  RADIOLOGY:  No results found.   ASSESSMENT AND PLAN:     #1 syncope due to bradycardia: Coreg on hold,  Based on his telemetry findings he have sick sinus syndrome and will need pacemaker I discussed with cardiology they state that they want to make sure that he does not have any underlying infection and would want clearance from infectious disease for his lower extremity I have put in a consult   #2.  Urinary tract infection start patient on ceftriaxone obtain urine culture   #3. lymphedema of both legs, patient has unna boots,  seen by wound care nurse . #4 hypothyroidism: We will continue Synthyroid.  #5.  Essential hypertension: Use lisinopril at this time.  Can use IV hydralazine as needed   # 6.  Diabetes mellitus  type 2: Patient is on insulin pump.,  Followed by Dr. Len Childs . #7. history of chronic diastolic heart failure, .  Stable   All the records are reviewed and case discussed with Care Management/Social Workerr. Management plans discussed with the patient, family and they are in agreement.  CODE STATUS: Full code  TOTAL TIME TAKING CARE OF THIS PATIENT: 35 minutes.   POSSIBLE D/C IN 1-2 DAYS, DEPENDING ON CLINICAL CONDITION.   Dustin Flock M.D on 12/05/2017 at 4:51 PM  Between 7am to 6pm - Pager - [phone]  After 6pm go to [email or web address] - password [email or web address] [facility]  Office  [phone]  CC: Primary care physician; [name], MD

## 2017-12-06 ENCOUNTER — Telehealth: Payer: Self-pay | Admitting: Oncology

## 2017-12-06 ENCOUNTER — Ambulatory Visit: Payer: Medicare Other | Admitting: Podiatry

## 2017-12-06 DIAGNOSIS — R001 Bradycardia, unspecified: Secondary | ICD-10-CM

## 2017-12-06 DIAGNOSIS — R55 Syncope and collapse: Secondary | ICD-10-CM

## 2017-12-06 DIAGNOSIS — I5022 Chronic systolic (congestive) heart failure: Secondary | ICD-10-CM

## 2017-12-06 LAB — BASIC METABOLIC PANEL
Anion gap: 6 (ref 5–15)
BUN: 33 mg/dL — ABNORMAL HIGH (ref 6–20)
CO2: 22 mmol/L (ref 22–32)
Calcium: 8 mg/dL — ABNORMAL LOW (ref 8.9–10.3)
Chloride: 108 mmol/L (ref 101–111)
Creatinine, Ser: 1.41 mg/dL — ABNORMAL HIGH (ref 0.61–1.24)
GFR calc Af Amer: 57 mL/min — ABNORMAL LOW (ref >60)
GFR, EST NON AFRICAN AMERICAN: 49 mL/min — AB (ref >60)
Glucose, Bld: 184 mg/dL — ABNORMAL HIGH (ref 65–99)
POTASSIUM: 4.7 mmol/L (ref 3.5–5.1)
SODIUM: 136 mmol/L (ref 135–145)

## 2017-12-06 LAB — CBC WITH DIFFERENTIAL/PLATELET
Basophils Absolute: 0.1 10*3/uL (ref 0–0.1)
Basophils Relative: 2 %
EOS PCT: 3 %
Eosinophils Absolute: 0.1 10*3/uL (ref 0–0.7)
HEMATOCRIT: 34.9 % — AB (ref 40.0–52.0)
Hemoglobin: 11.3 g/dL — ABNORMAL LOW (ref 13.0–18.0)
LYMPHS ABS: 0.3 10*3/uL — AB (ref 1.0–3.6)
LYMPHS PCT: 10 %
MCH: 28.9 pg (ref 26.0–34.0)
MCHC: 32.3 g/dL (ref 32.0–36.0)
MCV: 89.7 fL (ref 80.0–100.0)
MONO ABS: 0.5 10*3/uL (ref 0.2–1.0)
MONOS PCT: 16 %
Neutro Abs: 2.4 10*3/uL (ref 1.4–6.5)
Neutrophils Relative %: 69 %
PLATELETS: 96 10*3/uL — AB (ref 150–440)
RBC: 3.89 MIL/uL — ABNORMAL LOW (ref 4.40–5.90)
RDW: 16.3 % — AB (ref 11.5–14.5)
WBC: 3.4 10*3/uL — ABNORMAL LOW (ref 3.8–10.6)

## 2017-12-06 LAB — GLUCOSE, CAPILLARY
GLUCOSE-CAPILLARY: 154 mg/dL — AB (ref 65–99)
GLUCOSE-CAPILLARY: 210 mg/dL — AB (ref 65–99)
GLUCOSE-CAPILLARY: 201 mg/dL — AB (ref 65–99)
Glucose-Capillary: 202 mg/dL — ABNORMAL HIGH (ref 65–99)
Glucose-Capillary: 212 mg/dL — ABNORMAL HIGH (ref 65–99)
Glucose-Capillary: 252 mg/dL — ABNORMAL HIGH (ref 65–99)

## 2017-12-06 MED ORDER — CEFAZOLIN SODIUM-DEXTROSE 2-4 GM/100ML-% IV SOLN
2.0000 g | Freq: Three times a day (TID) | INTRAVENOUS | Status: DC
  Administered 2017-12-07: 2 g via INTRAVENOUS
  Filled 2017-12-06 (×5): qty 100

## 2017-12-06 MED ORDER — CEFAZOLIN SODIUM 10 G IJ SOLR
3.0000 g | INTRAMUSCULAR | Status: AC
  Administered 2017-12-07: 3 g via INTRAVENOUS
  Filled 2017-12-06: qty 3

## 2017-12-06 MED ORDER — SODIUM CHLORIDE 0.9 % IV SOLN
1.0000 g | INTRAVENOUS | Status: DC
  Administered 2017-12-06: 1 g via INTRAVENOUS
  Filled 2017-12-06: qty 10

## 2017-12-06 MED ORDER — SODIUM CHLORIDE 0.9% FLUSH
10.0000 mL | INTRAVENOUS | Status: DC | PRN
  Administered 2017-12-08 – 2017-12-11 (×2): 10 mL
  Filled 2017-12-06 (×2): qty 40

## 2017-12-06 MED ORDER — CHLORHEXIDINE GLUCONATE 4 % EX LIQD
60.0000 mL | Freq: Once | CUTANEOUS | Status: AC
  Administered 2017-12-06: 4 via TOPICAL

## 2017-12-06 MED ORDER — SODIUM CHLORIDE 0.9% FLUSH
10.0000 mL | Freq: Two times a day (BID) | INTRAVENOUS | Status: DC
  Administered 2017-12-06 – 2017-12-11 (×9): 10 mL

## 2017-12-06 MED ORDER — FLUTICASONE PROPIONATE 50 MCG/ACT NA SUSP
2.0000 | Freq: Every day | NASAL | Status: DC
  Administered 2017-12-06 – 2017-12-11 (×6): 2 via NASAL
  Filled 2017-12-06: qty 16

## 2017-12-06 MED ORDER — CEFAZOLIN SODIUM-DEXTROSE 1-4 GM/50ML-% IV SOLN
1.0000 g | Freq: Three times a day (TID) | INTRAVENOUS | Status: DC
Start: 2017-12-07 — End: 2017-12-06

## 2017-12-06 MED ORDER — SODIUM CHLORIDE 0.9 % IV SOLN
INTRAVENOUS | Status: DC
  Administered 2017-12-07: 11:00:00 via INTRAVENOUS

## 2017-12-06 MED ORDER — GENTAMICIN SULFATE 40 MG/ML IJ SOLN
80.0000 mg | INTRAMUSCULAR | Status: DC
  Filled 2017-12-06: qty 2

## 2017-12-06 MED ORDER — CHLORHEXIDINE GLUCONATE 4 % EX LIQD
60.0000 mL | Freq: Once | CUTANEOUS | Status: AC
  Administered 2017-12-07: 4 via TOPICAL

## 2017-12-06 NOTE — Progress Notes (Signed)
Subjective:  States to be doing reasonably well denies any chest pain still short of breath still has significant leg swelling no bleeding no discharge.  Patient denies fever chills or sweats  Objective:  Vital Signs in the last 24 hours: Temp:  [97.9 F (36.6 C)-98.8 F (37.1 C)] 97.9 F (36.6 C) (02/25 1230) Pulse Rate:  [30-87] 44 (02/25 1500) Resp:  [13-25] 13 (02/25 1500) BP: (119-163)/(38-67) 152/49 (02/25 1500) SpO2:  [92 %-100 %] 100 % (02/25 1500)  Intake/Output from previous day: 02/24 0701 - 02/25 0700 In: -  Out: 675 [Urine:675] Intake/Output from this shift: Total I/O In: 580 [P.O.:480; IV Piggyback:100] Out: -   Physical Exam: General appearance: appears older than stated age Neck: no adenopathy, no carotid bruit, no JVD, supple, symmetrical, trachea midline and thyroid not enlarged, symmetric, no tenderness/mass/nodules Lungs: diminished breath sounds bibasilar and bilaterally Heart: Bradycardia systolic ejection murmur Abdomen: soft, non-tender; bowel sounds normal; no masses,  no organomegaly Extremities: edema Chronic venous stasis changes lower extremities bandaged Pulses: 2+ and symmetric Skin: Skin color, texture, turgor normal. No rashes or lesions Neurologic: Alert and oriented X 3, normal strength and tone. Normal symmetric reflexes. Normal coordination and gait  Lab Results: No results for input(s): WBC, HGB, PLT in the last 72 hours. Recent Labs    12/04/17 0542 12/06/17 0540  NA 141 136  K 4.9 4.7  CL 110 108  CO2 23 22  GLUCOSE 225* 184*  BUN 28* 33*  CREATININE 1.59* 1.41*   No results for input(s): TROPONINI in the last 72 hours.  Invalid input(s): CK, MB Hepatic Function Panel No results for input(s): PROT, ALBUMIN, AST, ALT, ALKPHOS, BILITOT, BILIDIR, IBILI in the last 72 hours. No results for input(s): CHOL in the last 72 hours. No results for input(s): PROTIME in the last 72 hours.  Imaging: Imaging results have been  reviewed  Cardiac Studies:  Assessment/Plan:  Bradycardia  Second degree heart block Chronic venous stasis changes COPD Congestive heart failure Diabetes Generalized weakness . Plan Continue ICU level care Continue external pacemakers on demand Recommend IV Lasix therapy Will recommend infectious disease consult for clearance for permanent pacemaker implantation Recommend hematology input for non-Hodgkin's lymphoma If infectious disease clears patient will arrange for permanent pacemaker dual-chamber  LOS: 2 days    Dwayne D Callwood 12/06/2017, 4:51 PM

## 2017-12-06 NOTE — Telephone Encounter (Signed)
Called CCU and spoke with his nurse [name]. She will make a note on the encounter to get pr scheduled to see FInn 1-2 weeks after DS. BF

## 2017-12-06 NOTE — Telephone Encounter (Signed)
Wife called again after lunch and noone contacted her.  I told her that Gary [name] talked to [name] in CCU about fup-she stated noone has talked to her at all.

## 2017-12-06 NOTE — Consult Note (Signed)
[facility] Infectious Disease     Reason for Consult:LE wound and cellulitis   Referring Physician: Dustin Flock Date of Admission:  12/02/2017   Principal Problem:   Syncope Active Problems:   Thyroid disease   Diabetes ([place])   Essential hypertension, benign   GERD (gastroesophageal reflux disease)   CAD in native artery   Bradycardia   AKI (acute kidney injury) ([place])   Chronic systolic CHF (congestive heart failure) ([place])   HPI: Gary [name] is a 72 y.o. male admitted with syncope and found to have bradycardia requiring PPM placement. He has hx lymphedema and chronic venous stasis and has been following with [facility] since Dec 2018. He has been treated with oral abx and unnawraps.  He also has obesity, DM, mantle cell lymphoma, anemia.  He underwent portacath placement for chemo for hi lymphoma.  Currently he has a L post calf wound with some bleeding and drainage.   Past Medical History:  Diagnosis Date  . Depression   . 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 Gastroparesis   . Graves disease   . Hypercholesterolemia   . Hypertension   . Mantle cell lymphoma ([place])   . Peripheral neuropathy    Past Surgical History:  Procedure Laterality Date  . CHOLECYSTECTOMY    . NEPHRECTOMY     right  . PORTA CATH INSERTION N/A 11/03/2017   Procedure: PORTA CATH INSERTION;  Surgeon: Katha Cabal, MD;  Location: [facility];  Service: Cardiovascular;  Laterality: N/A;   Social History   Tobacco Use  . Smoking status: Former Research scientist (life sciences)  . Smokeless tobacco: Current User    Types: Chew  Substance Use Topics  . Alcohol use: Yes    Alcohol/week: 1.8 oz    Types: 3 Cans of beer per week    Comment: nightly  . Drug use: No   Family History  Problem Relation Age of Onset  . Breast cancer Mother   . Diabetes Father   . Cancer Father        Lymphoma  . Alcohol abuse Maternal Uncle   . Diabetes Sister   .  Heart disease Unknown        grandfather    Allergies:  Allergies  Allergen Reactions  . Rofecoxib Nausea Only    Current antibiotics: Antibiotics Given (last 72 hours)    Date/Time Action Medication Dose Rate   12/04/17 1023 Given   acyclovir (ZOVIRAX) 200 MG capsule 400 mg 400 mg    12/05/17 0944 Given   acyclovir (ZOVIRAX) 200 MG capsule 400 mg 400 mg    12/05/17 1235 New Bag/Given   cefTRIAXone (ROCEPHIN) 1 g in sodium chloride 0.9 % 100 mL IVPB 1 g 200 mL/hr   12/06/17 1044 Given   acyclovir (ZOVIRAX) 200 MG capsule 400 mg 400 mg    12/06/17 1045 New Bag/Given   cefTRIAXone (ROCEPHIN) 1 g in sodium chloride 0.9 % 100 mL IVPB 1 g 200 mL/hr      MEDICATIONS: . acyclovir  400 mg Oral Daily  . enoxaparin (LOVENOX) injection  40 mg Subcutaneous Q24H  . fluticasone  2 spray Each Nare Daily  . insulin pump   Subcutaneous TID AC, HS, 0200  . levothyroxine  25 mcg Oral QAC breakfast  . lisinopril  20 mg Oral Daily  . mouth rinse  15 mL Mouth Rinse BID  . pantoprazole  40 mg Oral Daily  . pravastatin  40 mg Oral QHS    Review of Systems - 11 systems reviewed and negative per HPI   OBJECTIVE: Temp:  [97.9 F (36.6 C)-98.8 F (37.1 C)] 97.9 F (36.6 C) (02/25 1230) Pulse Rate:  [30-87] 43 (02/25 1300) Resp:  [14-25] 16 (02/25 1300) BP: (119-158)/(38-67) 156/52 (02/25 1300) SpO2:  [92 %-100 %] 96 % (02/25 1300) Physical Exam  Constitutional: He is oriented to person, place, and time. Obese, disheveled HENT: anicteric Mouth/Throat: Oropharynx is clear and moist. No oropharyngeal exudate.  Cardiovascular: brady Pulmonary/Chest: Effort normal and breath sounds normal. No respiratory distress. He has no wheezes.  Abdominal: Soft. Bowel sounds are normal. He exhibits no distension. There is no tenderness.  Lymphadenopathy: He has no cervical adenopathy.  Neurological: He is alert and oriented to person, place, and time.  Ext 2+ edema bil LE Skin:LLE post calf with  shallow ulcer approx x4 cm , with red base, some blood, mild drainage Psychiatric: He has a normal mood and affect. His behavior is normal.     LABS: Results for orders placed or performed during the hospital encounter of 12/02/17 (from the past 48 hour(s))  Glucose, capillary     Status: Abnormal   Collection Time: 12/04/17  5:14 PM  Result Value Ref Range   Glucose-Capillary 168 (H) 65 - 99 mg/dL  Glucose, capillary     Status: Abnormal   Collection Time: 12/04/17  9:24 PM  Result Value Ref Range   Glucose-Capillary 57 (L) 65 - 99 mg/dL   Comment 1 Repeat Test   Glucose, capillary     Status: None   Collection Time: 12/04/17  9:26 PM  Result Value Ref Range   Glucose-Capillary 84 65 - 99 mg/dL  Glucose, capillary     Status: None   Collection Time: 12/04/17 10:38 PM  Result Value Ref Range   Glucose-Capillary 70 65 - 99 mg/dL  Glucose, capillary     Status: None   Collection Time: 12/04/17 10:39 PM  Result Value Ref Range   Glucose-Capillary 71 65 - 99 mg/dL  Glucose, capillary     Status: Abnormal   Collection Time: 12/05/17  2:13 AM  Result Value Ref Range   Glucose-Capillary 106 (H) 65 - 99 mg/dL  Glucose, capillary     Status: Abnormal   Collection Time: 12/05/17  7:29 AM  Result Value Ref Range   Glucose-Capillary 116 (H) 65 - 99 mg/dL   Comment 1 Document in Chart   Glucose, capillary     Status: Abnormal   Collection Time: 12/05/17 12:00 PM  Result Value Ref Range   Glucose-Capillary 179 (H) 65 - 99 mg/dL  Glucose, capillary     Status: Abnormal   Collection Time: 12/05/17  4:32 PM  Result Value Ref Range   Glucose-Capillary 144 (H) 65 - 99 mg/dL   Comment 1 Document in Chart   Glucose, capillary     Status: Abnormal   Collection Time: 12/05/17 10:59 PM  Result Value Ref Range   Glucose-Capillary 168 (H) 65 - 99 mg/dL  Glucose, capillary     Status: Abnormal   Collection Time: 12/06/17  2:43 AM  Result Value Ref Range   Glucose-Capillary 154 (H) 65 - 99  mg/dL  Basic metabolic panel     Status: Abnormal   Collection Time: 12/06/17  5:40 AM  Result Value Ref Range   Sodium 136 135 - 145 mmol/L   Potassium 4.7 3.5 - 5.1 mmol/L   Chloride 108 101 -  111 mmol/L   CO2 22 22 - 32 mmol/L   Glucose, Bld 184 (H) 65 - 99 mg/dL   BUN 33 (H) 6 - 20 mg/dL   Creatinine, Ser 1.41 (H) 0.61 - 1.24 mg/dL   Calcium 8.0 (L) 8.9 - 10.3 mg/dL   GFR calc non Af Amer 49 (L) >60 mL/min   GFR calc Af Amer 57 (L) >60 mL/min    Comment: (NOTE) The eGFR has been calculated using the CKD EPI equation. This calculation has not been validated in all clinical situations. eGFR's persistently <60 mL/min signify possible Chronic Kidney Disease.    Anion gap 6 5 - 15    Comment: Performed at [facility], [address] [place], [place] [address]  Glucose, capillary     Status: Abnormal   Collection Time: 12/06/17  8:21 AM  Result Value Ref Range   Glucose-Capillary 202 (H) 65 - 99 mg/dL  Glucose, capillary     Status: Abnormal   Collection Time: 12/06/17 11:34 AM  Result Value Ref Range   Glucose-Capillary 210 (H) 65 - 99 mg/dL   No components found for: ESR, C REACTIVE PROTEIN MICRO: Recent Results (from the past 720 hour(s))  MRSA PCR Screening     Status: None   Collection Time: 12/03/17  9:10 PM  Result Value Ref Range Status   MRSA by PCR NEGATIVE NEGATIVE Final    Comment:        The GeneXpert MRSA Assay (FDA approved for NASAL specimens only), is one component of a comprehensive MRSA colonization surveillance program. It is not intended to diagnose MRSA infection nor to guide or monitor treatment for MRSA infections. Performed at [name], [address] [place], [place] [address]     IMAGING: Dg Chest Portable 1 View  Result Date: 12/02/2017 CLINICAL DATA:  Syncopal episode landing in sitting position on floor. EXAM: PORTABLE CHEST 1 VIEW COMPARISON:  08/03/2017 FINDINGS: Right IJ Port-A-Cath has tip just below the  cavoatrial junction. Lungs are adequately inflated with hazy perihilar opacification likely mild interstitial edema. No evidence of effusion. Mild stable cardiomegaly. Worsening displacement of known right humeral neck fracture. Remainder of the exam is unchanged. IMPRESSION: Mild cardiomegaly with mild interstitial edema. Worsening displacement of known right humeral neck fracture. Electronically Signed   By: Marin Olp M.D.   On: 12/02/2017 20:20   Dg Foot Complete Right  Result Date: 12/02/2017 CLINICAL DATA:  Right foot laceration.  Fifth toe injury. EXAM: RIGHT FOOT COMPLETE - 3+ VIEW COMPARISON:  None. FINDINGS: Initial encounter. There is extensive artifact from bandage, reportedly there is active bleeding. No opaque foreign body is seen. No definite fracture. An apparent lucency across the base of the fifth proximal phalanx is not seen in the other projections. No dislocation. IMPRESSION: Limited by artifact from bandage. No evidence of opaque foreign body. No suspected fracture. Electronically Signed   By: Monte Fantasia M.D.   On: 12/02/2017 20:19    Assessment:   Gary [name] is a 72 y.o. male with chronic lymphedema, chronic wound LLE, mantle cell lymphoma, with portacath placement recently, now with syncope and bradycardia likely requiring PPM. There is concern that with the LLE wound he would seed any PPM however I think there is no evidence of active infection except a mild possible cellulitis.WBC is nml, No fevers. Has been started on ceftriaxone 2/24.  Follows as otpt with vascular and has been getting unnawraps. MRSA PCR negative.   Recommendations I have  cultured the wound. Will change ceftriaxone to ancef for now. Would not delay PPM if needed as this wound will take several months to heal and no evidence sepsis or abscess.  Will have him dced on oral abx - likely keflex but will adjust based on cultures.   Will need close fu with vascular for continued unnawraps and with me  for abx management as an otpt. Thank you very much for allowing me to participate in the care of this patient. Please call with questions.   Cheral Marker. Ola Spurr, MD

## 2017-12-06 NOTE — Progress Notes (Signed)
Subjective:  Bradycardia weakness fatigue mild shortness of breath  Objective:  Vital Signs in the last 24 hours: Temp:  [97.9 F (36.6 C)-98.8 F (37.1 C)] 97.9 F (36.6 C) (02/25 1230) Pulse Rate:  [30-87] 43 (02/25 1300) Resp:  [14-25] 16 (02/25 1300) BP: (119-158)/(38-67) 156/52 (02/25 1300) SpO2:  [92 %-100 %] 96 % (02/25 1300)  Intake/Output from previous day: 02/24 0701 - 02/25 0700 In: -  Out: 675 [Urine:675] Intake/Output from this shift: Total I/O In: 340 [P.O.:240; IV Piggyback:100] Out: -   Physical Exam: General appearance: appears older than stated age Neck: no adenopathy, no carotid bruit, no JVD, supple, symmetrical, trachea midline and thyroid not enlarged, symmetric, no tenderness/mass/nodules Lungs: diminished breath sounds bibasilar and bilaterally Heart: Bradycardic rate regular systolic ejection murmur Abdomen: soft, non-tender; bowel sounds normal; no masses,  no organomegaly Extremities: edema Generalized edema bilaterally venous stasis ulcers wrapped legs and Possible infection Pulses: 2+ and symmetric Skin: Skin color, texture, turgor normal. No rashes or lesions Neurologic: Alert and oriented X 3, normal strength and tone. Normal symmetric reflexes. Normal coordination and gait  Lab Results: No results for input(s): WBC, HGB, PLT in the last 72 hours. Recent Labs    12/04/17 0542 12/06/17 0540  NA 141 136  K 4.9 4.7  CL 110 108  CO2 23 22  GLUCOSE 225* 184*  BUN 28* 33*  CREATININE 1.59* 1.41*   Recent Labs    12/03/17 1524  TROPONINI <0.03   Hepatic Function Panel No results for input(s): PROT, ALBUMIN, AST, ALT, ALKPHOS, BILITOT, BILIDIR, IBILI in the last 72 hours. No results for input(s): CHOL in the last 72 hours. No results for input(s): PROTIME in the last 72 hours.  Imaging: Imaging results have been reviewed  Cardiac Studies:  Assessment/Plan:  Bradycardia possibly second-degree heart block Syncope possibly related  to bradycardia COPD dyspnea Arrhythmia Cardiomyopathy CHF Coronary Artery Disease Edema Shortness of Breath  Acute on chronic renal insufficiency Mantle cell lymphoma Diabetes type 2 Hyperlipidemia . Plan Agree with monitoring in intensive care External pacemaker is on standby D not recommend internal pacemaker at this point temporarily Recommend antibiotic therapy for legs Continue antibiotic therapy for possible UTI Continue heart failure management Agree with diabetes management and control Chronic systolic congestive heart failure recommend continued therapy Recommend infectious disease evaluation for advice on permanent pacemaker placement   LOS: 2 days    Adelia Baptista D Oris Calmes 12/06/2017, 1:20 PM

## 2017-12-06 NOTE — Progress Notes (Signed)
  [facility] Progess Note  CC follow up bradycardia HPI Alert and awake No apparent distress Follow up cardiology recs  INTAKE / OUTPUT:  Intake/Output Summary (Last 24 hours) at 12/06/2017 0930 Last data filed at 12/06/2017 0841 Gross per 24 hour  Intake 240 ml  Output 675 ml  Net -435 ml      VITAL SIGNS: Temp:  [97.9 F (36.6 C)-98.8 F (37.1 C)] 97.9 F (36.6 C) (02/25 0800) Pulse Rate:  [30-87] 44 (02/25 0842) Resp:  [12-22] 14 (02/25 0842) BP: (119-158)/(38-67) 147/46 (02/25 0842) SpO2:  [99 %-100 %] 100 % (02/25 0842)  PHYSICAL EXAMINATION: Physical Examination:   VS: BP (!) 147/46   Pulse (!) 44   Temp 97.9 F (36.6 C) (Oral)   Resp 14   Ht 5\' 10"  (1.778 m)   Wt 264 lb 14.4 oz (120.2 kg)   SpO2 100%   BMI 38.01 kg/m   General Appearance: No distress  Neuro:without focal findings, mental status normal. HEENT: PERRLA, EOM intact. Pulmonary: normal breath sounds   Cardiovascular bradycardia with ventricular response in the 30s Abdomen: Benign, Soft, non-tender. Skin:   warm, no rashes, no ecchymosis  Extremities: normal, no cyanosis, clubbing.    LABORATORY PANEL:   CBC Recent Labs  Lab 12/03/17 0953  WBC 3.6*  HGB 11.0*  HCT 33.4*  PLT 91*    Chemistries  Recent Labs  Lab 12/04/17 0542 12/06/17 0540  NA 141 136  K 4.9 4.7  CL 110 108  CO2 23 22  GLUCOSE 225* 184*  BUN 28* 33*  CREATININE 1.59* 1.41*  CALCIUM 8.4* 8.0*  MG 2.0  --   PHOS 3.6  --     Recent Labs  Lab 12/05/17 0729 12/05/17 1200 12/05/17 1632 12/05/17 2259 12/06/17 0243 12/06/17 0821  GLUCAP 116* 179* 144* 168* 154* 202*    ASSESSMENT/PLAN  72 yo white male with severe bradycardia,syncope  Bradycardia. Being followed by cardiology. Pending disposition regarding treatment options. Some telemetry rhythms appear to be complete heart block or others just bradycardia without clear P-wave morphology. Narrow complex escape  rhythm  History of mantle cell lymphoma. Port in place on chemotherapy  Renal insufficiency   Diabetes. On basal rate with coverage  Hypercholesterolemia. On statin  Hypertension. On lisinopril  Peripheral neuropathy   Corrin Parker, M.D.  [facility]  Medical Director [place] Director [facility] Cardio-Pulmonary Department

## 2017-12-06 NOTE — Progress Notes (Addendum)
Pharmacy Antibiotic Note  Gary [name] is a 72 y.o. male admitted on 12/02/2017. Pharmacy has been consulted for cefazolin dosing. Patient is being treated for left post calf wound infection. Patient has medical history significant for mantle cell lymphoma - with portacath placement, lymphedema, chronic venous stasis, obesity, and diabetes. Patient is to receive permanent pacemaker after being cleared by ID.   Plan: Patient received am dose of ceftriaxone, will initiate cefazolin 2g IV Q8hr with first dose on 2/26.   Height: 5\' 10"  (177.8 cm) Weight: 264 lb 14.4 oz (120.2 kg) IBW/kg (Calculated) : 73  Temp (24hrs), Avg:98.2 F (36.8 C), Min:97.9 F (36.6 C), Max:98.8 F (37.1 C)  Recent Labs  Lab 12/02/17 1851 12/03/17 0953 12/04/17 0542 12/06/17 0540  WBC 3.3* 3.6*  --   --   CREATININE 1.55* 1.45* 1.59* 1.41*    Estimated Creatinine Clearance: 64.3 mL/min (A) (by C-G formula based on SCr of 1.41 mg/dL (H)).    Allergies  Allergen Reactions  . Rofecoxib Nausea Only    Antimicrobials this admission: Acyclovir (home medication) 2/22  >>  Ceftriaxone 2/24 >> 2/25 Cefazolin 2/26 >>  Dose adjustments this admission: N/A  Microbiology results: 2/25 Wound Cx: pending  2/24 UCx: pending  2/22 MRSA PCR: negative   Thank you for allowing pharmacy to be a part of this patient's care.  Aleane Wesenberg L 12/06/2017 2:59 PM

## 2017-12-06 NOTE — Progress Notes (Signed)
Subjective:  Patient states he feels reasonably well still fatigue tired no fever chills or sweats no significant pain  Objective:  Vital Signs in the last 24 hours: Temp:  [97.9 F (36.6 C)-98.8 F (37.1 C)] 97.9 F (36.6 C) (02/25 1230) Pulse Rate:  [30-87] 44 (02/25 1500) Resp:  [13-25] 13 (02/25 1500) BP: (119-163)/(38-67) 152/49 (02/25 1500) SpO2:  [92 %-100 %] 100 % (02/25 1500)  Intake/Output from previous day: 02/24 0701 - 02/25 0700 In: -  Out: 675 [Urine:675] Intake/Output from this shift: Total I/O In: 580 [P.O.:480; IV Piggyback:100] Out: -   Physical Exam: General appearance: appears older than stated age Neck: no adenopathy, no carotid bruit, no JVD, supple, symmetrical, trachea midline and thyroid not enlarged, symmetric, no tenderness/mass/nodules Lungs: clear to auscultation bilaterally Heart: Bradycardic slightly irregular systolic ejection murmur Abdomen: soft, non-tender; bowel sounds normal; no masses,  no organomegaly Extremities: edema 3+ lower extremity edema wrapped Pulses: 2+ and symmetric Skin: Skin color, texture, turgor normal. No rashes or lesions Neurologic: Alert and oriented X 3, normal strength and tone. Normal symmetric reflexes. Normal coordination and gait  Lab Results: No results for input(s): WBC, HGB, PLT in the last 72 hours. Recent Labs    12/04/17 0542 12/06/17 0540  NA 141 136  K 4.9 4.7  CL 110 108  CO2 23 22  GLUCOSE 225* 184*  BUN 28* 33*  CREATININE 1.59* 1.41*   No results for input(s): TROPONINI in the last 72 hours.  Invalid input(s): CK, MB Hepatic Function Panel No results for input(s): PROT, ALBUMIN, AST, ALT, ALKPHOS, BILITOT, BILIDIR, IBILI in the last 72 hours. No results for input(s): CHOL in the last 72 hours. No results for input(s): PROTIME in the last 72 hours.  Imaging: Imaging results have been reviewed  Cardiac Studies:  Assessment/Plan: Preop for permanent pacemaker Bradycardia 2nd Degree  heart block Arrhythmia Cardiomyopathy CHF Coronary Artery Disease Edema Palpitations Shortness of Breath  Chronic venous stasis changes . Plan Recommend permanent pacemaker tomorrow to be performed by Dr. Saralyn Pilar Recommend dual-chamber pacemaker for bradycardia Agree with infectious disease input for clearance for permanent pacemaker Agree with hematology input for non-Hodgkin's lymphoma therapy Continue heart failure therapy Agree with diabetes management with insulin  LOS: 2 days    Jenniefer Salak D Aleynah Rocchio 12/06/2017, 4:30 PM

## 2017-12-06 NOTE — Telephone Encounter (Signed)
Patient getting a pacemaker.  Wait until he is discharged then we can schedule f/u 1-2 weeks later.

## 2017-12-06 NOTE — Progress Notes (Signed)
[place] at [place] NAME: Gary [name]    MR#:  [id number]  DATE OF BIRTH:  06-16-1946  CHIEF COMPLAINT:   Chief Complaint  Patient presents with  . Loss of Consciousness  . Laceration   Patient's heart rate continues to drop into the 30s complains of feeling weak  REVIEW OF SYSTEMS:    Review of Systems  Constitutional: Negative for chills and fever.  HENT: Negative for hearing loss.   Eyes: Negative for blurred vision, double vision and photophobia.  Respiratory: Positive for cough. Negative for hemoptysis and shortness of breath.   Cardiovascular: Negative for palpitations, orthopnea and leg swelling.  Gastrointestinal: Negative for abdominal pain, diarrhea and vomiting.  Genitourinary: Negative for dysuria and urgency.  Musculoskeletal: Negative for myalgias and neck pain.  Skin: Negative for rash.  Neurological: Positive for weakness. Negative for dizziness, focal weakness, seizures and headaches.  Psychiatric/Behavioral: Negative for memory loss. The patient does not have insomnia.     Nutrition: Tolerating Diet: Tolerating PT:      DRUG ALLERGIES:   Allergies  Allergen Reactions  . Rofecoxib Nausea Only    VITALS:  Blood pressure (!) 156/52, pulse (!) 43, temperature 97.9 F (36.6 C), temperature source Oral, resp. rate 16, height 5\' 10"  (1.778 m), weight 264 lb 14.4 oz (120.2 kg), SpO2 96 %.  PHYSICAL EXAMINATION:   Physical Exam  GENERAL:  72 y.o.-year-old patient lying in the bed with no acute distress.  EYES: Pupils equal, round, reactive to light and accommodation. No scleral icterus. Extraocular muscles intact.  HEENT: Head atraumatic, normocephalic. Oropharynx and nasopharynx clear.  NECK:  Supple, no jugular venous distention. No thyroid enlargement, no tenderness.  LUNGS: Normal breath sounds bilaterally, no wheezing, rales,rhonchi or crepitation. No use of accessory muscles of respiration.   CARDIOVASCULAR: S1, S2 normal. No murmurs, rubs, or gallops.  ABDOMEN: Soft, nontender, nondistended. Bowel sounds present. No organomegaly or mass.  EXTREMITIES: Patient had laceration of the right foot NEUROLOGIC: Cranial nerves II through XII are intact. Muscle strength 5/5 in all extremities. Sensation intact. Gait not checked.  PSYCHIATRIC: The patient is alert and oriented x 3.  SKIN: Laceration repair in the emergency room now has dressing present.Marland Kitchen    LABORATORY PANEL:   CBC Recent Labs  Lab 12/03/17 0953  WBC 3.6*  HGB 11.0*  HCT 33.4*  PLT 91*   ------------------------------------------------------------------------------------------------------------------  Chemistries  Recent Labs  Lab 12/04/17 0542 12/06/17 0540  NA 141 136  K 4.9 4.7  CL 110 108  CO2 23 22  GLUCOSE 225* 184*  BUN 28* 33*  CREATININE 1.59* 1.41*  CALCIUM 8.4* 8.0*  MG 2.0  --    ------------------------------------------------------------------------------------------------------------------  Cardiac Enzymes Recent Labs  Lab 12/03/17 1524  TROPONINI <0.03   ------------------------------------------------------------------------------------------------------------------  RADIOLOGY:  No results found.   ASSESSMENT AND PLAN:     #1 syncope due to bradycardia: Coreg on hold,  Based on his telemetry findings he have sick sinus syndrome and will need pacemaker I discussed with cardiology they state that they want to make sure that he does not have any underlying infection and would want clearance from infectious disease for his lower extremity I have put in a consult  #2.  Urinary tract infection urine cultures pending  #3. lymphedema of both legs, patient has unna boots,  seen by wound care nurse . #4 hypothyroidism: We will continue Synthyroid.  #5.  Essential hypertension: Use lisinopril at this time.  Can use IV hydralazine as needed   # 6.  Diabetes mellitus type 2:  Patient is on insulin pump.,  Followed by Dr. Len Childs .  Had hypoglycemia yesterday Blood sugars now improved  #7. history of chronic diastolic heart failure, .  Stable   All the records are reviewed and case discussed with Care Management/Social Workerr. Management plans discussed with the patient, family and they are in agreement.  CODE STATUS: Full code  TOTAL TIME TAKING CARE OF THIS PATIENT: 35 minutes.   POSSIBLE D/C IN 1-2 DAYS, DEPENDING ON CLINICAL CONDITION.   Dustin Flock M.D on 12/06/2017 at 2:26 PM  Between 7am to 6pm - Pager - [phone]  After 6pm go to [email or web address] - password [email or web address] [facility]  Office  [phone]  CC: Primary care physician; [name], MD

## 2017-12-06 NOTE — Care Management (Signed)
Patient now in ICU step down. Per previous hand-off from [facility] patient has referral to [facility] home health pending.

## 2017-12-07 ENCOUNTER — Inpatient Hospital Stay: Payer: Medicare Other

## 2017-12-07 ENCOUNTER — Ambulatory Visit: Payer: Medicare Other | Admitting: Oncology

## 2017-12-07 ENCOUNTER — Inpatient Hospital Stay: Payer: Medicare Other | Admitting: Anesthesiology

## 2017-12-07 ENCOUNTER — Encounter
Admission: EM | Disposition: A | Discharge status: Home Health | Payer: Self-pay | Source: Home / Self Care | Attending: Internal Medicine

## 2017-12-07 ENCOUNTER — Other Ambulatory Visit: Payer: Medicare Other

## 2017-12-07 ENCOUNTER — Ambulatory Visit: Payer: Medicare Other

## 2017-12-07 ENCOUNTER — Encounter: Payer: Self-pay | Admitting: Anesthesiology

## 2017-12-07 DIAGNOSIS — Z950 Presence of cardiac pacemaker: Secondary | ICD-10-CM | POA: Insufficient documentation

## 2017-12-07 DIAGNOSIS — N179 Acute kidney failure, unspecified: Secondary | ICD-10-CM

## 2017-12-07 DIAGNOSIS — T671XXS Heat syncope, sequela: Secondary | ICD-10-CM

## 2017-12-07 DIAGNOSIS — I2510 Atherosclerotic heart disease of native coronary artery without angina pectoris: Secondary | ICD-10-CM

## 2017-12-07 HISTORY — PX: PACEMAKER INSERTION: SHX728

## 2017-12-07 LAB — CBC WITH DIFFERENTIAL/PLATELET
Basophils Absolute: 0.1 10*3/uL (ref 0–0.1)
Basophils Relative: 2 %
Eosinophils Absolute: 0.1 10*3/uL (ref 0–0.7)
Eosinophils Relative: 5 %
HEMATOCRIT: 33.1 % — AB (ref 40.0–52.0)
HEMOGLOBIN: 10.7 g/dL — AB (ref 13.0–18.0)
LYMPHS PCT: 11 %
Lymphs Abs: 0.3 10*3/uL — ABNORMAL LOW (ref 1.0–3.6)
MCH: 29.1 pg (ref 26.0–34.0)
MCHC: 32.4 g/dL (ref 32.0–36.0)
MCV: 90 fL (ref 80.0–100.0)
MONO ABS: 0.5 10*3/uL (ref 0.2–1.0)
MONOS PCT: 18 %
NEUTROS ABS: 1.9 10*3/uL (ref 1.4–6.5)
NEUTROS PCT: 64 %
Platelets: 93 10*3/uL — ABNORMAL LOW (ref 150–440)
RBC: 3.68 MIL/uL — ABNORMAL LOW (ref 4.40–5.90)
RDW: 16.3 % — AB (ref 11.5–14.5)
WBC: 2.9 10*3/uL — ABNORMAL LOW (ref 3.8–10.6)

## 2017-12-07 LAB — BASIC METABOLIC PANEL
ANION GAP: 8 (ref 5–15)
BUN: 31 mg/dL — ABNORMAL HIGH (ref 6–20)
CO2: 23 mmol/L (ref 22–32)
Calcium: 8.2 mg/dL — ABNORMAL LOW (ref 8.9–10.3)
Chloride: 110 mmol/L (ref 101–111)
Creatinine, Ser: 1.32 mg/dL — ABNORMAL HIGH (ref 0.61–1.24)
GFR calc Af Amer: >60 mL/min (ref >60)
GFR calc non Af Amer: 53 mL/min — ABNORMAL LOW (ref >60)
GLUCOSE: 231 mg/dL — AB (ref 65–99)
POTASSIUM: 5.2 mmol/L — AB (ref 3.5–5.1)
Sodium: 141 mmol/L (ref 135–145)

## 2017-12-07 LAB — POCT I-STAT 4, (NA,K, GLUC, HGB,HCT)
GLUCOSE: 230 mg/dL — AB (ref 65–99)
HEMATOCRIT: 33 % — AB (ref 39.0–52.0)
HEMOGLOBIN: 11.2 g/dL — AB (ref 13.0–17.0)
Potassium: 5.3 mmol/L — ABNORMAL HIGH (ref 3.5–5.1)
SODIUM: 143 mmol/L (ref 135–145)

## 2017-12-07 LAB — GLUCOSE, CAPILLARY
GLUCOSE-CAPILLARY: 206 mg/dL — AB (ref 65–99)
GLUCOSE-CAPILLARY: 179 mg/dL — AB (ref 65–99)
Glucose-Capillary: 190 mg/dL — ABNORMAL HIGH (ref 65–99)
Glucose-Capillary: 213 mg/dL — ABNORMAL HIGH (ref 65–99)
Glucose-Capillary: 183 mg/dL — ABNORMAL HIGH (ref 65–99)
Glucose-Capillary: 181 mg/dL — ABNORMAL HIGH (ref 65–99)
Glucose-Capillary: 176 mg/dL — ABNORMAL HIGH (ref 65–99)

## 2017-12-07 LAB — URINE CULTURE: CULTURE: NO GROWTH

## 2017-12-07 SURGERY — INSERTION, CARDIAC PACEMAKER
Anesthesia: General | Laterality: Bilateral

## 2017-12-07 SURGERY — INSERTION, CARDIAC PACEMAKER
Anesthesia: General

## 2017-12-07 MED ORDER — CEFAZOLIN SODIUM-DEXTROSE 1-4 GM/50ML-% IV SOLN: 1.0000 g | Freq: Four times a day (QID) | INTRAVENOUS | Status: DC

## 2017-12-07 MED ORDER — FENTANYL CITRATE (PF) 100 MCG/2ML IJ SOLN
INTRAMUSCULAR | Status: AC
  Filled 2017-12-07: qty 2

## 2017-12-07 MED ORDER — SODIUM CHLORIDE 0.9 % IV SOLN
3.0000 g | Freq: Four times a day (QID) | INTRAVENOUS | Status: DC
  Administered 2017-12-07 – 2017-12-10 (×11): 3 g via INTRAVENOUS
  Filled 2017-12-07 (×16): qty 3

## 2017-12-07 MED ORDER — PROPOFOL 10 MG/ML IV BOLUS
INTRAVENOUS | Status: DC | PRN
  Administered 2017-12-07 (×2): 30 mg via INTRAVENOUS

## 2017-12-07 MED ORDER — ACETAMINOPHEN 325 MG PO TABS: 325.0000 mg | ORAL_TABLET | ORAL | Status: DC | PRN

## 2017-12-07 MED ORDER — ONDANSETRON HCL 4 MG/2ML IJ SOLN: 4.0000 mg | Freq: Four times a day (QID) | INTRAMUSCULAR | Status: DC | PRN

## 2017-12-07 MED ORDER — SODIUM CHLORIDE 0.9 % IR SOLN
Status: DC | PRN
  Administered 2017-12-07: 30 mL

## 2017-12-07 MED ORDER — INSULIN ASPART 100 UNIT/ML IV SOLN
10.0000 [IU] | Freq: Once | INTRAVENOUS | Status: DC
  Filled 2017-12-07: qty 0.1

## 2017-12-07 MED ORDER — PROPOFOL 10 MG/ML IV BOLUS
INTRAVENOUS | Status: AC
Start: 2017-12-07 — End: 2017-12-07
  Filled 2017-12-07: qty 20

## 2017-12-07 MED ORDER — SODIUM POLYSTYRENE SULFONATE PO POWD
30.0000 g | Freq: Once | ORAL | Status: AC
  Administered 2017-12-07: 30 g via ORAL
  Filled 2017-12-07: qty 30

## 2017-12-07 MED ORDER — DEXTROSE 50 % IV SOLN: 1.0000 | Freq: Once | INTRAVENOUS | Status: DC

## 2017-12-07 MED ORDER — PROPOFOL 500 MG/50ML IV EMUL
INTRAVENOUS | Status: DC | PRN
  Administered 2017-12-07: 50 ug/kg/min via INTRAVENOUS

## 2017-12-07 MED ORDER — FENTANYL CITRATE (PF) 100 MCG/2ML IJ SOLN: 25.0000 ug | INTRAMUSCULAR | Status: DC | PRN

## 2017-12-07 MED ORDER — MIDAZOLAM HCL 2 MG/2ML IJ SOLN
INTRAMUSCULAR | Status: AC
  Filled 2017-12-07: qty 2

## 2017-12-07 MED ORDER — ONDANSETRON HCL 4 MG/2ML IJ SOLN: 4.0000 mg | Freq: Once | INTRAMUSCULAR | Status: DC | PRN

## 2017-12-07 SURGICAL SUPPLY — 37 items
BAG DECANTER FOR FLEXI CONT (MISCELLANEOUS) ×3 IMPLANT
BRUSH SCRUB EZ  4% CHG (MISCELLANEOUS) ×2
BRUSH SCRUB EZ 4% CHG (MISCELLANEOUS) ×1 IMPLANT
CABLE SURG 12 DISP A/V CHANNEL (MISCELLANEOUS) ×3 IMPLANT
CANISTER SUCT 1200ML W/VALVE (MISCELLANEOUS) ×3 IMPLANT
CHLORAPREP W/TINT 26ML (MISCELLANEOUS) ×3 IMPLANT
COVER LIGHT HANDLE STERIS (MISCELLANEOUS) ×6 IMPLANT
COVER MAYO STAND STRL (DRAPES) ×3 IMPLANT
DRAPE C-ARM XRAY 36X54 (DRAPES) ×3 IMPLANT
DRSG TEGADERM 4X4.75 (GAUZE/BANDAGES/DRESSINGS) ×3 IMPLANT
DRSG TELFA 4X3 1S NADH ST (GAUZE/BANDAGES/DRESSINGS) ×3 IMPLANT
ELECT REM PT RETURN 9FT ADLT (ELECTROSURGICAL) ×3
ELECTRODE REM PT RTRN 9FT ADLT (ELECTROSURGICAL) ×1 IMPLANT
GLOVE BIO SURGEON STRL SZ7.5 (GLOVE) ×3 IMPLANT
GLOVE BIO SURGEON STRL SZ8 (GLOVE) ×3 IMPLANT
GOWN STRL REUS W/ TWL LRG LVL3 (GOWN DISPOSABLE) ×1 IMPLANT
GOWN STRL REUS W/ TWL XL LVL3 (GOWN DISPOSABLE) ×1 IMPLANT
GOWN STRL REUS W/TWL LRG LVL3 (GOWN DISPOSABLE) ×3
GOWN STRL REUS W/TWL XL LVL3 (GOWN DISPOSABLE) ×3
IMMOBILIZER SHDR MD LX WHT (SOFTGOODS) IMPLANT
IMMOBILIZER SHDR XL LX WHT (SOFTGOODS) ×2 IMPLANT
INTRO PACEMAKR LEAD 9FR 13CM (INTRODUCER) ×3
INTRO PACEMKR SHEATH II 7FR (MISCELLANEOUS) ×3
INTRODUCER PACEMKR LD 9FR 13CM (INTRODUCER) IMPLANT
INTRODUCER PACEMKR SHTH II 7FR (MISCELLANEOUS) ×1 IMPLANT
IPG PACE AZUR XT DR MRI W1DR01 (Pacemaker) IMPLANT
IV NS 500ML (IV SOLUTION)
IV NS 500ML BAXH (IV SOLUTION) ×1 IMPLANT
KIT TURNOVER KIT A (KITS) ×3 IMPLANT
LABEL OR SOLS (LABEL) ×1 IMPLANT
LEAD CAPSURE NOVUS 5076-52CM (Lead) ×2 IMPLANT
LEAD CAPSURE NOVUS 5076-58CM (Lead) ×2 IMPLANT
MARKER SKIN DUAL TIP RULER LAB (MISCELLANEOUS) ×3 IMPLANT
PACE AZURE XT DR MRI W1DR01 (Pacemaker) ×3 IMPLANT
PACK PACE INSERTION (MISCELLANEOUS) ×3 IMPLANT
PAD ONESTEP ZOLL R SERIES ADT (MISCELLANEOUS) ×3 IMPLANT
SUT SILK 0 SH 30 (SUTURE) ×9 IMPLANT

## 2017-12-07 NOTE — Progress Notes (Signed)
Assisted patient in changing of insulin pump.

## 2017-12-07 NOTE — Progress Notes (Signed)
Patient admitted into room. Left chest dressing clean, dry, intact. Arm sling in place.  No c/o pain. VSS stable. Bed alarm on, patient educated to call for assistance. Will continue to assess and monitor.

## 2017-12-07 NOTE — Progress Notes (Signed)
RN spoke with Dr. Mortimer Fries and made MD aware that patient's potassium is 5.2 and that NP had ordered insulin and d50 and that diabetes coordinator is concerned about patient dropping his blood sugar being on insulin pump and being NPO for surgery at 1200.  MD gave order to d/c insulin and d50 and to give 30 g kayexalate.

## 2017-12-07 NOTE — Progress Notes (Signed)
Patient transferred to PACU.   Report given to Amy, RN.

## 2017-12-07 NOTE — Progress Notes (Signed)
  [place] [facility] Progess Note   CC follow up bradycardia HPI Alert and awake No apparent distress Follow up cardiology recs k =5.2 Will place kayoxelate    INTAKE / OUTPUT:  Intake/Output Summary (Last 24 hours) at 12/07/2017 0752 Last data filed at 12/07/2017 0603 Gross per 24 hour  Intake 920 ml  Output 585 ml  Net 335 ml      VITAL SIGNS: Temp:  [97.5 F (36.4 C)-98.5 F (36.9 C)] 97.5 F (36.4 C) (02/26 0200) Pulse Rate:  [30-47] 44 (02/26 0600) Resp:  [10-25] 16 (02/26 0600) BP: (120-163)/(40-69) 144/46 (02/26 0600) SpO2:  [92 %-100 %] 97 % (02/26 0600)  PHYSICAL EXAMINATION: Physical Examination:   VS: BP (!) 144/46   Pulse (!) 44   Temp (!) 97.5 F (36.4 C) (Oral)   Resp 16   Ht 5\' 10"  (1.778 m)   Wt 264 lb 14.4 oz (120.2 kg)   SpO2 97%   BMI 38.01 kg/m   General Appearance: No distress  Neuro:without focal findings, mental status normal. HEENT: PERRLA, EOM intact. Pulmonary: normal breath sounds   Cardiovascular bradycardia with ventricular response in the 30s Abdomen: Benign, Soft, non-tender. Skin:   warm, no rashes, no ecchymosis  Extremities: normal, no cyanosis, clubbing.    LABORATORY PANEL:   CBC Recent Labs  Lab 12/07/17 0440  WBC 2.9*  HGB 10.7*  HCT 33.1*  PLT 93*    Chemistries  Recent Labs  Lab 12/04/17 0542  12/07/17 0440  NA 141   < > 141  K 4.9   < > 5.2*  CL 110   < > 110  CO2 23   < > 23  GLUCOSE 225*   < > 231*  BUN 28*   < > 31*  CREATININE 1.59*   < > 1.32*  CALCIUM 8.4*   < > 8.2*  MG 2.0  --   --   PHOS 3.6  --   --    < > = values in this interval not displayed.    Recent Labs  Lab 12/06/17 1134 12/06/17 1608 12/06/17 2133 12/06/17 2337 12/07/17 0155 12/07/17 0728  GLUCAP 210* 201* 252* 212* 190* 206*    ASSESSMENT/PLAN  72 yo white male with severe bradycardia,syncope, elevated K  Bradycardia. Being followed by cardiology. Pending disposition regarding treatment  options. Some telemetry rhythms appear to be complete heart block or others just bradycardia without clear P-wave morphology. Narrow complex escape rhythm Plan for pacemaker placement  History of mantle cell lymphoma. Port in place on chemotherapy  Renal insufficiency -eleavted K-give kayoxelate  Diabetes. On basal rate with coverage  Hypercholesterolemia. On statin  Hypertension. On lisinopril  Peripheral neuropathy  Remains SD status until procedure completed  Corrin Parker, M.D.  [facility]  Medical Director [place] Director [facility] Cardio-Pulmonary Department

## 2017-12-07 NOTE — Progress Notes (Signed)
   12/07/17 1255  Clinical Encounter Type  Visited With Patient not available  Visit Type Initial   Attempted introductory visit, patient unavailable.  Chaplain to follow up at a later time.

## 2017-12-07 NOTE — Progress Notes (Signed)
[place] at [place] NAME: Gary [name]    MR#:  [id number]  DATE OF BIRTH:  Feb 24, 1946  CHIEF COMPLAINT:   Chief Complaint  Patient presents with  . Loss of Consciousness  . Laceration   Patient awaiting pacemaker later today   REVIEW OF SYSTEMS:    Review of Systems  Constitutional: Negative for chills and fever.  HENT: Negative for hearing loss.   Eyes: Negative for blurred vision, double vision and photophobia.  Respiratory: Positive for cough. Negative for hemoptysis and shortness of breath.   Cardiovascular: Negative for palpitations, orthopnea and leg swelling.  Gastrointestinal: Negative for abdominal pain, diarrhea and vomiting.  Genitourinary: Negative for dysuria and urgency.  Musculoskeletal: Negative for myalgias and neck pain.  Skin: Negative for rash.  Neurological: Positive for weakness. Negative for dizziness, focal weakness, seizures and headaches.  Psychiatric/Behavioral: Negative for memory loss. The patient does not have insomnia.     Nutrition: Tolerating Diet: Tolerating PT:      DRUG ALLERGIES:   Allergies  Allergen Reactions  . Rofecoxib Nausea Only    VITALS:  Blood pressure (!) 145/47, pulse 85, temperature 98.2 F (36.8 C), resp. rate 18, height 5\' 10"  (1.778 m), weight 264 lb 14.4 oz (120.2 kg), SpO2 100 %.  PHYSICAL EXAMINATION:   Physical Exam  GENERAL:  72 y.o.-year-old patient lying in the bed with no acute distress.  EYES: Pupils equal, round, reactive to light and accommodation. No scleral icterus. Extraocular muscles intact.  HEENT: Head atraumatic, normocephalic. Oropharynx and nasopharynx clear.  NECK:  Supple, no jugular venous distention. No thyroid enlargement, no tenderness.  LUNGS: Normal breath sounds bilaterally, no wheezing, rales,rhonchi or crepitation. No use of accessory muscles of respiration.  CARDIOVASCULAR: S1, S2 normal. No murmurs, rubs, or gallops.   ABDOMEN: Soft, nontender, nondistended. Bowel sounds present. No organomegaly or mass.  EXTREMITIES: Patient had laceration of the right foot NEUROLOGIC: Cranial nerves II through XII are intact. Muscle strength 5/5 in all extremities. Sensation intact. Gait not checked.  PSYCHIATRIC: The patient is alert and oriented x 3.  SKIN: Laceration repair in the emergency room now has dressing present.Marland Kitchen    LABORATORY PANEL:   CBC Recent Labs  Lab 12/07/17 0440 12/07/17 1208  WBC 2.9*  --   HGB 10.7* 11.2*  HCT 33.1* 33.0*  PLT 93*  --    ------------------------------------------------------------------------------------------------------------------  Chemistries  Recent Labs  Lab 12/04/17 0542  12/07/17 0440 12/07/17 1208  NA 141   < > 141 143  K 4.9   < > 5.2* 5.3*  CL 110   < > 110  --   CO2 23   < > 23  --   GLUCOSE 225*   < > 231* 230*  BUN 28*   < > 31*  --   CREATININE 1.59*   < > 1.32*  --   CALCIUM 8.4*   < > 8.2*  --   MG 2.0  --   --   --    < > = values in this interval not displayed.   ------------------------------------------------------------------------------------------------------------------  Cardiac Enzymes Recent Labs  Lab 12/03/17 1524  TROPONINI <0.03   ------------------------------------------------------------------------------------------------------------------  RADIOLOGY:  Dg Chest Port 1 View  Result Date: 12/07/2017 CLINICAL DATA:  S/p pacemaker EXAM: PORTABLE CHEST 1 VIEW COMPARISON:  12/02/2017, CT chest 10/19/2017, radiograph 08/03/2017 FINDINGS: Right-sided central venous port tip obscured by overlying support devices, catheter seen to the cavoatrial region. Interim insertion  of left-sided pacing device with leads projecting over right atrium and right ventricle. Negative for a pneumothorax. Cardiomegaly with vascular congestion and mild interstitial edema. Patchy atelectasis at the bases. No large pleural effusion. Displaced right  humeral neck fracture with callus. IMPRESSION: 1. Insertion of left-sided pacing device as above. Negative for pneumothorax 2. Cardiomegaly with vascular congestion and mild interstitial edema. Patchy atelectasis at the right base 3. Right humeral neck fracture with callus formation. Electronically Signed   By: Donavan Foil M.D.   On: 12/07/2017 14:09   Dg C-arm 1-60 Min-no Report  Result Date: 12/07/2017 Fluoroscopy was utilized by the requesting physician.  No radiographic interpretation.     ASSESSMENT AND PLAN:  Patient 72 year old with syncope due to bradycardia   #1 syncope due to bradycardia: Coreg on hold,  Awaiting pacemaker placement later today  #2.  Urinary tract infection urine cultures showed no growth  #3. lymphedema of both legs, patient has unna boots,  seen by wound care nurse History of chronic wound infection, appreciate infectious disease input currently on Ancef oral antibiotics on discharge . #4 hypothyroidism: We will continue Synthyroid.  #5.  Essential hypertension: Use lisinopril at this time.  Can use IV hydralazine as needed   # 6.  Diabetes mellitus type 2: Patient is on insulin pump.,  Followed by Dr. Len Childs Continue insulin pump.    #7. history of chronic diastolic heart failure, Stable  All the records are reviewed and case discussed with Care Management/Social Workerr. Management plans discussed with the patient, family and they are in agreement.  CODE STATUS: Full code  TOTAL TIME TAKING CARE OF THIS PATIENT: 35 minutes.   POSSIBLE D/C IN 1-2 DAYS, DEPENDING ON CLINICAL CONDITION.   Dustin Flock M.D on 12/07/2017 at 3:23 PM  Between 7am to 6pm - Pager - [phone]  After 6pm go to [email or web address] - password [email or web address] [facility]  Office  [phone]  CC: Primary care physician; [name], MD

## 2017-12-07 NOTE — OR Nursing (Signed)
Telemetry box in place

## 2017-12-07 NOTE — Progress Notes (Signed)
RN asked Dr. Mortimer Fries during rounds about giving morning dose of lisinipril and MD stated to hold it until after surgery.

## 2017-12-07 NOTE — Op Note (Signed)
[facility] Cardiology   12/07/2017                     1:34 PM  PATIENT:  Gary [name]    PRE-OPERATIVE DIAGNOSIS:  BRADYCARDIA SECOND DEGREE HEART BLOCK  POST-OPERATIVE DIAGNOSIS:  Same  PROCEDURE:  INSERTION PACEMAKER DUAL CHAMBER INITIAL INSERT  SURGEON:  Isaias Cowman, MD    ANESTHESIA:     PREOPERATIVE INDICATIONS:  Gary [name] is a  71 y.o. male with a diagnosis of [place] who failed conservative measures and elected for surgical management.    The risks benefits and alternatives were discussed with the patient preoperatively including but not limited to the risks of infection, bleeding, cardiopulmonary complications, the need for revision surgery, among others, and the patient was willing to proceed.   OPERATIVE PROCEDURE: The patient was brought to the operating room the fasting state.  The left pectoral region was prepped and draped in usual sterile manner.  Anesthesia was obtained 1% lidocaine locally.  A 6 cm incision was performed the left pectoral region.  Pacemaker pocket was generated by electrocautery and blunt dissection.  Access was obtained to the left subclavian vein by fine-needle aspiration.  MRI compatible leads were positioned to the right ventricular apical septum ( Medtronic [id number] )  and right atrial appendage ( Medtronic [id number] ) under fluoroscopic guidance.  After proper thresholds were obtained the leads were sutured in place.  The leads were connected to an MRI compatible dual-chamber rate responsive pacemaker generator ( Medtronic [id number] ).  The pacemaker pocket was irrigated gentamicin solution.  The pacemaker generator was positioned into the pocket and the pocket was closed with 2-0 and 4-0 Vicryl, respectively.  Steri-Strips and pressure dressing were applied.  There were no periprocedural complications.  Postprocedural interrogation revealed appropriate dual-chamber sensing and pacing thresholds.

## 2017-12-07 NOTE — Progress Notes (Signed)
PACU RN spoke to this RN about patient's bed status. Dr. Mortimer Fries had given verbal order that patient could transfer to telemetry after procedure if Dr. Saralyn Pilar approved. Per PACU RN, Dr. Saralyn Pilar wants patient on 2A. Order placed.

## 2017-12-07 NOTE — Progress Notes (Signed)
Inpatient Diabetes Program Recommendations  AACE/ADA: New Consensus Statement on Inpatient Glycemic Control (2015)  Target Ranges:  Prepandial:   less than 140 mg/dL      Peak postprandial:   less than 180 mg/dL (1-2 hours)      Critically ill patients:  140 - 180 mg/dL   Results for LLOYD, AYO (MRN [id number]) as of 12/07/2017 07:20  Ref. Range 12/06/2017 02:43 12/06/2017 08:21 12/06/2017 11:34 12/06/2017 16:08 12/06/2017 21:33 12/06/2017 23:37 12/07/2017 01:55  Glucose-Capillary Latest Ref Range: 65 - 99 mg/dL 154 (H) 202 (H) 210 (H) 201 (H) 252 (H) 212 (H) 190 (H)   Review of Glycemic Control  Diabetes history: DM1 (makes no insulin; requires basal, correction, and meal coverage insulin) Outpatient Diabetes medications: Insulin Pump (total basal is 17.4 units/24 H, sensitivity 1:60 (1 unit drops glucose 60 mg/dl), carb ratio 1:10 (1 unit covers 10 grams of carbohydrates)) Current orders for Inpatient glycemic control: Insulin Pump ACHS & 2 am  Inpatient Diabetes Program Recommendations:  Insulin:  Noted order for Novolog 10 units IV x 1 along with D50 for hyperkalemia treatment. Patient has on insulin pump for inpatient glycemic control. Concerned about Novolog 10 units being given as patient is NPO, has on an insulin pump, and very senstitive to insulin (1 unit drops glucose 60 mg/dl per insulin pump settings).   NOTE: Called unit and spoke with [name], RN regarding concern with Novolog order. [name], RN will discuss with MD.  Thanks, Barnie Alderman, RN, MSN, CDE Diabetes Coordinator Inpatient Diabetes Program [phone] (Team Pager from 8am to [place])

## 2017-12-07 NOTE — Anesthesia Post-op Follow-up Note (Signed)
Anesthesia QCDR form completed.        

## 2017-12-07 NOTE — Progress Notes (Signed)
Pharmacy Antibiotic Note  Gary [name] is a 72 y.o. male admitted on 12/02/2017. Pharmacy has been consulted for Unasyn dosing. Patient is being treated for left post calf wound infection. Patient has medical history significant for mantle cell lymphoma - with portacath placement, lymphedema, chronic venous stasis, obesity, and diabetes. Patient is to receive permanent pacemaker after being cleared by ID.   Plan: Will start Unasyn 3g Q6H.   Height: 5\' 10"  (177.8 cm) Weight: 264 lb 14.4 oz (120.2 kg) IBW/kg (Calculated) : 73  Temp (24hrs), Avg:97.8 F (36.6 C), Min:97.5 F (36.4 C), Max:98.2 F (36.8 C)  Recent Labs  Lab 12/02/17 1851 12/03/17 0953 12/04/17 0542 12/06/17 0540 12/06/17 2147 12/07/17 0440  WBC 3.3* 3.6*  --   --  3.4* 2.9*  CREATININE 1.55* 1.45* 1.59* 1.41*  --  1.32*    Estimated Creatinine Clearance: 68.7 mL/min (A) (by C-G formula based on SCr of 1.32 mg/dL (H)).    Allergies  Allergen Reactions  . Rofecoxib Nausea Only    Antimicrobials this admission: Acyclovir (home medication) 2/22  >>  Ceftriaxone 2/24 >> 2/25 Cefazolin 2/26 >>2/26 Unasyn 2/26 >>  Dose adjustments this admission: N/A  Microbiology results: 2/25 Wound Cx: Rare Enterococcus faecalis  2/24 UCx: NGTD  2/22 MRSA PCR: negative   Thank you for allowing pharmacy to be a part of this patient's care.  Lendon Ka, PharmD Pharmacy Resident 12/07/2017 7:37 PM

## 2017-12-07 NOTE — Consult Note (Signed)
[place] Nurse wound consult note Reason for Consult:biLATERAL unna boots for lower extremity edema.  Wound care to left posterior calf.   Wound type: venous insufficiency Pressure Injury POA: NA Measurement: left posterior calf wound:  4 cm x 3 cm x 0.3 cm  Wound SEG:BTDVV red, bleeding and weeping Drainage (amount, consistency, odor) moderate serosanguinous weeping.  Musty odor Periwound:chronic skin changes, edema Dressing procedure/placement/frequency:Cleanse bilateral lower legs with soap and water and pat dry.  Apply Aquacel Ag to wound bed.  Wrap with zinc layer from below toes tobelow knee.  Change twice weekly.  Tuesday and Friday.  [place] team will follow.  Domenic Moras RN BSN [place] Pager [phone]

## 2017-12-07 NOTE — Progress Notes (Signed)
Patient with significant bradycardia, requires permament pacemaker.

## 2017-12-07 NOTE — Progress Notes (Signed)
[facility] INFECTIOUS DISEASE PROGRESS NOTE Date of Admission:  12/02/2017     ID: Gary [name] is a 72 y.o. male with  Wound infection Principal Problem:   Syncope Active Problems:   Thyroid disease   Diabetes ([place])   Essential hypertension, benign   GERD (gastroesophageal reflux disease)   CAD in native artery   Bradycardia   AKI (acute kidney injury) ([place])   Chronic systolic CHF (congestive heart failure) (HCC)   Subjective: Out of unti, ppm placed  ROS  Eleven systems are reviewed and negative except per hpi  Medications:  Antibiotics Given (last 72 hours)    Date/Time Action Medication Dose Rate   12/05/17 0944 Given   acyclovir (ZOVIRAX) 200 MG capsule 400 mg 400 mg    12/05/17 1235 New Bag/Given   cefTRIAXone (ROCEPHIN) 1 g in sodium chloride 0.9 % 100 mL IVPB 1 g 200 mL/hr   12/06/17 1044 Given   acyclovir (ZOVIRAX) 200 MG capsule 400 mg 400 mg    12/06/17 1045 New Bag/Given   cefTRIAXone (ROCEPHIN) 1 g in sodium chloride 0.9 % 100 mL IVPB 1 g 200 mL/hr   12/07/17 0533 New Bag/Given   ceFAZolin (ANCEF) IVPB 2g/100 mL premix 2 g 200 mL/hr   12/07/17 1051 Given   acyclovir (ZOVIRAX) 200 MG capsule 400 mg 400 mg    12/07/17 1235 New Bag/Given   ceFAZolin (ANCEF) 3 g in dextrose 5 % 50 mL IVPB 3 g    12/07/17 1300 Given   gentamicin (GARAMYCIN) 80 mg in sodium chloride irrigation 0.9 % 500 mL irrigation 30 mL      . acyclovir  400 mg Oral Daily  . enoxaparin (LOVENOX) injection  40 mg Subcutaneous Q24H  . fluticasone  2 spray Each Nare Daily  . insulin pump   Subcutaneous TID AC, HS, 0200  . levothyroxine  25 mcg Oral QAC breakfast  . lisinopril  20 mg Oral Daily  . mouth rinse  15 mL Mouth Rinse BID  . pantoprazole  40 mg Oral Daily  . pravastatin  40 mg Oral QHS  . sodium chloride flush  10-40 mL Intracatheter Q12H    Objective: Vital signs in last 24 hours: Temp:  [97.5 F (36.4 C)-98.5 F (36.9 C)] 98.2 F (36.8 C) (02/26 1424) Pulse Rate:   [32-85] 85 (02/26 1503) Resp:  [10-24] 18 (02/26 1503) BP: (122-163)/(42-101) 145/47 (02/26 1503) SpO2:  [97 %-100 %] 100 % (02/26 1503) Constitutional: He is oriented to person, place, and time. Obese, disheveled HENT: anicteric Mouth/Throat: Oropharynx is clear and moist. No oropharyngeal exudate.  Cardiovascular: brady Pulmonary/Chest: Effort normal and breath sounds normal. No respiratory distress. He has no wheezes.  Abdominal: Soft. Bowel sounds are normal. He exhibits no distension. There is no tenderness.  Lymphadenopathy: He has no cervical adenopathy.  Neurological: He is alert and oriented to person, place, and time.  Ext 2+ edema bil LE Skin:LLE post calf with shallow ulcer approx x4 cm , with red base, some blood, mild drainage Psychiatric: He has a normal mood and affect. His behavior is normal.    Lab Results Recent Labs    12/06/17 0540  12/06/17 2147 12/07/17 0440 12/07/17 1208  WBC  --   --  3.4* 2.9*  --   HGB  --    < > 11.3* 10.7* 11.2*  HCT  --    < > 34.9* 33.1* 33.0*  NA 136  --   --  141 143  K 4.7  --   --  5.2* 5.3*  CL 108  --   --  110  --   CO2 22  --   --  23  --   BUN 33*  --   --  31*  --   CREATININE 1.41*  --   --  1.32*  --    < > = values in this interval not displayed.    Microbiology: @micro @ Studies/Results: Dg Chest Port 1 View  Result Date: 12/07/2017 CLINICAL DATA:  S/p pacemaker EXAM: PORTABLE CHEST 1 VIEW COMPARISON:  12/02/2017, CT chest 10/19/2017, radiograph 08/03/2017 FINDINGS: Right-sided central venous port tip obscured by overlying support devices, catheter seen to the cavoatrial region. Interim insertion of left-sided pacing device with leads projecting over right atrium and right ventricle. Negative for a pneumothorax. Cardiomegaly with vascular congestion and mild interstitial edema. Patchy atelectasis at the bases. No large pleural effusion. Displaced right humeral neck fracture with callus. IMPRESSION: 1. Insertion of  left-sided pacing device as above. Negative for pneumothorax 2. Cardiomegaly with vascular congestion and mild interstitial edema. Patchy atelectasis at the right base 3. Right humeral neck fracture with callus formation. Electronically Signed   By: Donavan Foil M.D.   On: 12/07/2017 14:09   Dg C-arm 1-60 Min-no Report  Result Date: 12/07/2017 Fluoroscopy was utilized by the requesting physician.  No radiographic interpretation.    Assessment/Plan: Gary [name] is a 72 y.o. male with chronic lymphedema, chronic wound LLE, mantle cell lymphoma, with portacath placement recently, now with syncope and bradycardia likely requiring PPM. There is concern that with the LLE wound he would seed any PPM however I think there is no evidence of active infection except a mild possible cellulitis.WBC is nml, No fevers. Has been started on ceftriaxone 2/24.  Follows as otpt with vascular and has been getting unnawraps. MRSA PCR negative.  2/26 - s/p ppm cx with enterococcus   Recommendations Change to unasyn to cover the enterococcus and other pathogens in place of ancef Will have him dced on oral abx - likely augmentin but will adjust based on cultures.   Will need close fu with vascular for continued unnawraps and with me for abx management as an otpt.   Thank you very much for the consult. Will follow with you.  Leonel Ramsay   12/07/2017, 4:40 PM

## 2017-12-07 NOTE — Progress Notes (Signed)
Chaplain provided silent prayer and spiritual support for the patient in PACU.

## 2017-12-07 NOTE — Anesthesia Preprocedure Evaluation (Signed)
Anesthesia Evaluation  Patient identified by MRN, date of birth, ID band Patient awake    Reviewed: Allergy & Precautions, H&P , NPO status , Patient's Chart, lab work & pertinent test results, reviewed documented beta blocker date and time   History of Anesthesia Complications Negative for: history of anesthetic complications  Airway Mallampati: I  TM Distance: >3 FB Neck ROM: full    Dental  (+) Edentulous Lower, Lower Dentures, Missing, Poor Dentition, Dental Advidsory Given   Pulmonary shortness of breath, neg sleep apnea, neg COPD, neg recent URI, former smoker,           Cardiovascular Exercise Tolerance: Good hypertension, (-) angina+ CAD and +CHF  (-) Past MI, (-) Cardiac Stents and (-) CABG negative cardio ROS  + dysrhythmias (symptomatic bradycardia) + Valvular Problems/Murmurs      Neuro/Psych Seizures -, Well Controlled,  PSYCHIATRIC DISORDERS Depression  Neuromuscular disease    GI/Hepatic Neg liver ROS, GERD  ,  Endo/Other  diabetesHyperthyroidism   Renal/GU Renal disease (One kidney)  negative genitourinary   Musculoskeletal   Abdominal   Peds  Hematology negative hematology ROS (+)   Anesthesia Other Findings Past Medical History: No date: Depression No date: 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 No date: Gastroparesis No date: Graves disease No date: Hypercholesterolemia No date: Hypertension No date: Mantle cell lymphoma (HCC) No date: Peripheral neuropathy   Reproductive/Obstetrics negative OB ROS                             Anesthesia Physical Anesthesia Plan  ASA: IV  Anesthesia Plan: General   Post-op Pain Management:    Induction: Intravenous  PONV Risk Score and Plan: 2 and Propofol infusion  Airway Management Planned: Simple Face Mask  Additional Equipment:   Intra-op Plan:   Post-operative  Plan:   Informed Consent: I have reviewed the patients History and Physical, chart, labs and discussed the procedure including the risks, benefits and alternatives for the proposed anesthesia with the patient or authorized representative who has indicated his/her understanding and acceptance.   Dental Advisory Given  Plan Discussed with: Anesthesiologist, CRNA and Surgeon  Anesthesia Plan Comments:         Anesthesia Quick Evaluation

## 2017-12-07 NOTE — Transfer of Care (Signed)
Immediate Anesthesia Transfer of Care Note  Patient: Gary [name]  Procedure(s) Performed: INSERTION PACEMAKER DUAL CHAMBER INITIAL INSERT (N/A )  Patient Location: PACU  Anesthesia Type:MAC  Level of Consciousness: awake  Airway & Oxygen Therapy: Patient Spontanous Breathing and Patient connected to nasal cannula oxygen  Post-op Assessment: Report given to RN and Post -op Vital signs reviewed and stable  Post vital signs: Reviewed and stable  Last Vitals:  Vitals:   12/07/17 1000 12/07/17 1100  BP: (!) 160/50 (!) 163/50  Pulse: (!) 42 (!) 44  Resp: 17 (!) 23  Temp:    SpO2: 97% 100%    Last Pain:  Vitals:   12/07/17 0800  TempSrc: Axillary  PainSc: 0-No pain      Patients Stated Pain Goal: 0 (56/25/63 8937)  Complications: No apparent anesthesia complications

## 2017-12-08 ENCOUNTER — Ambulatory Visit: Payer: Medicare Other

## 2017-12-08 ENCOUNTER — Encounter (INDEPENDENT_AMBULATORY_CARE_PROVIDER_SITE_OTHER): Payer: Medicare Other

## 2017-12-08 ENCOUNTER — Encounter: Payer: Self-pay | Admitting: Cardiology

## 2017-12-08 LAB — GLUCOSE, CAPILLARY
Glucose-Capillary: 147 mg/dL — ABNORMAL HIGH (ref 65–99)
Glucose-Capillary: 160 mg/dL — ABNORMAL HIGH (ref 65–99)
Glucose-Capillary: 154 mg/dL — ABNORMAL HIGH (ref 65–99)
Glucose-Capillary: 103 mg/dL — ABNORMAL HIGH (ref 65–99)

## 2017-12-08 NOTE — Anesthesia Postprocedure Evaluation (Signed)
Anesthesia Post Note  Patient: Gary [name]  Procedure(s) Performed: INSERTION PACEMAKER DUAL CHAMBER INITIAL INSERT (N/A )  Patient location during evaluation: PACU Anesthesia Type: General Level of consciousness: awake and alert Pain management: pain level controlled Vital Signs Assessment: post-procedure vital signs reviewed and stable Respiratory status: spontaneous breathing, nonlabored ventilation, respiratory function stable and patient connected to nasal cannula oxygen Cardiovascular status: blood pressure returned to baseline and stable Postop Assessment: no apparent nausea or vomiting Anesthetic complications: no     Last Vitals:  Vitals:   12/07/17 1957 12/08/17 0351  BP: (!) 154/64 (!) 161/56  Pulse: 87 83  Resp: 18 18  Temp: 37 C 36.7 C  SpO2: 95% 98%    Last Pain:  Vitals:   12/08/17 0351  TempSrc: Oral  PainSc:                  Martha Clan

## 2017-12-08 NOTE — Progress Notes (Signed)
[facility] INFECTIOUS DISEASE PROGRESS NOTE Date of Admission:  12/02/2017     ID: JANIS CUFFE is a 72 y.o. male with  Wound infection Principal Problem:   Syncope Active Problems:   Thyroid disease   Diabetes ([place])   Essential hypertension, benign   GERD (gastroesophageal reflux disease)   CAD in native artery   Bradycardia   AKI (acute kidney injury) ([place])   Chronic systolic CHF (congestive heart failure) (HCC)   Subjective: Doing well post PPM placement. No fevers. Legs wrapped   ROS  Eleven systems are reviewed and negative except per hpi  Medications:  Antibiotics Given (last 72 hours)    Date/Time Action Medication Dose Rate   12/06/17 1044 Given   acyclovir (ZOVIRAX) 200 MG capsule 400 mg 400 mg    12/06/17 1045 New Bag/Given   cefTRIAXone (ROCEPHIN) 1 g in sodium chloride 0.9 % 100 mL IVPB 1 g 200 mL/hr   12/07/17 0533 New Bag/Given   ceFAZolin (ANCEF) IVPB 2g/100 mL premix 2 g 200 mL/hr   12/07/17 1051 Given   acyclovir (ZOVIRAX) 200 MG capsule 400 mg 400 mg    12/07/17 1235 New Bag/Given   ceFAZolin (ANCEF) 3 g in dextrose 5 % 50 mL IVPB 3 g    12/07/17 1300 Given   gentamicin (GARAMYCIN) 80 mg in sodium chloride irrigation 0.9 % 500 mL irrigation 30 mL    12/07/17 2130 New Bag/Given   Ampicillin-Sulbactam (UNASYN) 3 g in sodium chloride 0.9 % 100 mL IVPB 3 g 200 mL/hr   12/08/17 0318 New Bag/Given   Ampicillin-Sulbactam (UNASYN) 3 g in sodium chloride 0.9 % 100 mL IVPB 3 g 200 mL/hr   12/08/17 0818 Given   acyclovir (ZOVIRAX) 200 MG capsule 400 mg 400 mg    12/08/17 0819 New Bag/Given   Ampicillin-Sulbactam (UNASYN) 3 g in sodium chloride 0.9 % 100 mL IVPB 3 g 200 mL/hr   12/08/17 1447 New Bag/Given   Ampicillin-Sulbactam (UNASYN) 3 g in sodium chloride 0.9 % 100 mL IVPB 3 g 200 mL/hr     . acyclovir  400 mg Oral Daily  . enoxaparin (LOVENOX) injection  40 mg Subcutaneous Q24H  . fluticasone  2 spray Each Nare Daily  . insulin pump   Subcutaneous  TID AC, HS, 0200  . levothyroxine  25 mcg Oral QAC breakfast  . lisinopril  20 mg Oral Daily  . mouth rinse  15 mL Mouth Rinse BID  . pantoprazole  40 mg Oral Daily  . pravastatin  40 mg Oral QHS  . sodium chloride flush  10-40 mL Intracatheter Q12H    Objective: Vital signs in last 24 hours: Temp:  [98 F (36.7 C)-98.6 F (37 C)] 98 F (36.7 C) (02/27 0351) Pulse Rate:  [83-87] 83 (02/27 0351) Resp:  [18] 18 (02/27 0351) BP: (154-161)/(56-64) 161/56 (02/27 0351) SpO2:  [95 %-98 %] 98 % (02/27 0351) Constitutional: He is oriented to person, place, and time. Obese, disheveled HENT: anicteric Mouth/Throat: Oropharynx is clear and moist. No oropharyngeal exudate.  Cardiovascular: brady Pulmonary/Chest: Effort normal and breath sounds normal. No respiratory distress. He has no wheezes.  Abdominal: Soft. Bowel sounds are normal. He exhibits no distension. There is no tenderness.  Lymphadenopathy: He has no cervical adenopathy.  Neurological: He is alert and oriented to person, place, and time.  Ext 2+ edema bil LE Skin bil LE wrapped Psychiatric: He has a normal mood and affect. His behavior is normal.    Lab  Results Recent Labs    12/06/17 0540  12/06/17 2147 12/07/17 0440 12/07/17 1208  WBC  --   --  3.4* 2.9*  --   HGB  --    < > 11.3* 10.7* 11.2*  HCT  --    < > 34.9* 33.1* 33.0*  NA 136  --   --  141 143  K 4.7  --   --  5.2* 5.3*  CL 108  --   --  110  --   CO2 22  --   --  23  --   BUN 33*  --   --  31*  --   CREATININE 1.41*  --   --  1.32*  --    < > = values in this interval not displayed.    Microbiology: Results for orders placed or performed during the hospital encounter of 12/02/17  MRSA PCR Screening     Status: None   Collection Time: 12/03/17  9:10 PM  Result Value Ref Range Status   MRSA by PCR NEGATIVE NEGATIVE Final    Comment:        The GeneXpert MRSA Assay (FDA approved for NASAL specimens only), is one component of a comprehensive MRSA  colonization surveillance program. It is not intended to diagnose MRSA infection nor to guide or monitor treatment for MRSA infections. Performed at [facility], [address] [place], [place] [address]   Urine Culture     Status: None   Collection Time: 12/05/17 11:05 PM  Result Value Ref Range Status   Specimen Description   Final    URINE, RANDOM Performed at [facility], [address] [place], [place] [address]    Special Requests   Final    NONE Performed at [facility], [address], [place], [place] [address]    Culture   Final    NO GROWTH Performed at [facility], [place] [address], [place], [place] [address]    Report Status 12/07/2017 FINAL  Final  Aerobic Culture (superficial specimen)     Status: None (Preliminary result)   Collection Time: 12/06/17  2:28 PM  Result Value Ref Range Status   Specimen Description WOUND RIGHT FOOT  Final   Special Requests NONE  Final   Gram Stain   Final    FEW WBC PRESENT, PREDOMINANTLY MONONUCLEAR NO ORGANISMS SEEN    Culture   Final    RARE ENTEROCOCCUS FAECALIS SUSCEPTIBILITIES TO FOLLOW Performed at [facility], [place] [address], [place], [place] [address]    Report Status PENDING  Incomplete    Studies/Results: Dg Chest Port 1 View  Result Date: 12/07/2017 CLINICAL DATA:  S/p pacemaker EXAM: PORTABLE CHEST 1 VIEW COMPARISON:  12/02/2017, CT chest 10/19/2017, radiograph 08/03/2017 FINDINGS: Right-sided central venous port tip obscured by overlying support devices, catheter seen to the cavoatrial region. Interim insertion of left-sided pacing device with leads projecting over right atrium and right ventricle. Negative for a pneumothorax. Cardiomegaly with vascular congestion and mild interstitial edema. Patchy atelectasis at the bases. No large pleural effusion. Displaced right humeral neck fracture with callus. IMPRESSION: 1. Insertion of left-sided pacing device as above.  Negative for pneumothorax 2. Cardiomegaly with vascular congestion and mild interstitial edema. Patchy atelectasis at the right base 3. Right humeral neck fracture with callus formation. Electronically Signed   By: Donavan Foil M.D.   On: 12/07/2017 14:09   Dg C-arm 1-60 Min-no Report  Result Date:  12/07/2017 Fluoroscopy was utilized by the requesting physician.  No radiographic interpretation.    Assessment/Plan: JAXDEN BLYDEN is a 72 y.o. male with chronic lymphedema, chronic wound LLE, mantle cell lymphoma, with portacath placement recently, now with syncope and bradycardia likely requiring PPM. There is concern that with the LLE wound would seed any PPM however I think there is no evidence of active infection except a mild possible cellulitis.WBC is nml, No fevers.  Follows as otpt with vascular and has been getting unnawraps. MRSA PCR negative.  2/26 - s/p ppm cx with enterococcus  2.27 - no fevers.    Recommendations Cont  unasyn to cover the enterococcus and other pathogens in place of ancef Will have him dced on oral abx - likely augmentin but will adjust based on cultures.   Will need close fu with vascular for continued unnawraps and with me for abx management as an otpt.  Thank you very much for the consult. Will follow with you.  Leonel Ramsay   12/08/2017, 4:47 PM

## 2017-12-08 NOTE — Progress Notes (Signed)
   12/08/17 1430  Clinical Encounter Type  Visited With Patient  Visit Type Follow-up  Spiritual Encounters  Spiritual Needs Emotional   Chaplain received page regarding patient follow up.  Conversation surrounding patient's current emotions and how they have been impacting him.  Exploration of supports and resources available to client.  Chaplain engaged patient in some life review related to that which brings him joy and engages his interest.  Chaplain encouraged patient to reach out as needed.

## 2017-12-08 NOTE — Progress Notes (Signed)
Pt is one assist with cane to BRP. Pt is now OOB to chair and is tearful. Pastoral care will be consulted. I will continue to assess.

## 2017-12-08 NOTE — Progress Notes (Signed)
PT Cancellation Note  Patient Details Name: TREYVONE CHELF MRN: [id number] DOB: 1946-01-18   Cancelled Treatment:    Reason Eval/Treat Not Completed: Medical issues which prohibited therapy; Pt's Ka currently at 5.3 which is outside guidelines for participation with PT services.  Will attempt to see pt at a future date/time as medically appropriate.    Linus Salmons PT, DPT 12/08/17, 12:02 PM

## 2017-12-08 NOTE — Care Management (Signed)
Patient transferred out of icu within last 24 hours.  PPM 12/07/2017.  Potassium elevated today requiring and not able to participate with physical therapy. At present, continue to anticipate discharge with home health through [facility].  [facility] referral has also been made

## 2017-12-08 NOTE — Progress Notes (Signed)
[facility] at [place] NAME: Gary [name]    MR#:  [id number]  DATE OF BIRTH:  10-11-1946  SUBJECTIVE:   Patient here due to syncope secondary from significant bradycardia status post pacemaker placement yesterday. he also has a chronic left lower extremity wound for which she is currently on IV Unasyn. Still remains quite weak and deconditioned.  REVIEW OF SYSTEMS:    Review of Systems  Constitutional: Negative for chills and fever.  HENT: Negative for congestion and tinnitus.   Eyes: Negative for blurred vision and double vision.  Respiratory: Negative for cough, shortness of breath and wheezing.   Cardiovascular: Negative for chest pain, orthopnea and PND.  Gastrointestinal: Negative for abdominal pain, diarrhea, nausea and vomiting.  Genitourinary: Negative for dysuria and hematuria.  Neurological: Negative for dizziness, sensory change and focal weakness.  All other systems reviewed and are negative.   Nutrition: Heart Healthy Tolerating Diet: yes Tolerating PT: Await Eval.   DRUG ALLERGIES:   Allergies  Allergen Reactions  . Rofecoxib Nausea Only    VITALS:  Blood pressure (!) 161/56, pulse 83, temperature 98 F (36.7 C), temperature source Oral, resp. rate 18, height 5\' 10"  (1.778 m), weight 120.2 kg (264 lb 14.4 oz), SpO2 98 %.  PHYSICAL EXAMINATION:   Physical Exam  GENERAL:  72 y.o.-year-old patient lying in bed lethargic but follows commands.  EYES: Pupils equal, round, reactive to light and accommodation. No scleral icterus. Extraocular muscles intact.  HEENT: Head atraumatic, normocephalic. Oropharynx and nasopharynx clear.  NECK:  Supple, no jugular venous distention. No thyroid enlargement, no tenderness.  LUNGS: Poor Resp. effort, no wheezing, rales, rhonchi. No use of accessory muscles of respiration.  CARDIOVASCULAR: S1, S2 normal. No murmurs, rubs, or gallops.  Left chest wall PPM in place. ABDOMEN: Soft,  nontender, nondistended. Bowel sounds present. No organomegaly or mass.  EXTREMITIES: No cyanosis, clubbing or edema b/l.    NEUROLOGIC: Cranial nerves II through XII are intact. No focal Motor or sensory deficits b/l.  Globally weak.  PSYCHIATRIC: The patient is alert and oriented x 3.  SKIN: No obvious rash, lesion, or ulcer.    LABORATORY PANEL:   CBC Recent Labs  Lab 12/07/17 0440 12/07/17 1208  WBC 2.9*  --   HGB 10.7* 11.2*  HCT 33.1* 33.0*  PLT 93*  --    ------------------------------------------------------------------------------------------------------------------  Chemistries  Recent Labs  Lab 12/04/17 0542  12/07/17 0440 12/07/17 1208  NA 141   < > 141 143  K 4.9   < > 5.2* 5.3*  CL 110   < > 110  --   CO2 23   < > 23  --   GLUCOSE 225*   < > 231* 230*  BUN 28*   < > 31*  --   CREATININE 1.59*   < > 1.32*  --   CALCIUM 8.4*   < > 8.2*  --   MG 2.0  --   --   --    < > = values in this interval not displayed.   ------------------------------------------------------------------------------------------------------------------  Cardiac Enzymes Recent Labs  Lab 12/03/17 1524  TROPONINI <0.03   ------------------------------------------------------------------------------------------------------------------  RADIOLOGY:  Dg Chest Port 1 View  Result Date: 12/07/2017 CLINICAL DATA:  S/p pacemaker EXAM: PORTABLE CHEST 1 VIEW COMPARISON:  12/02/2017, CT chest 10/19/2017, radiograph 08/03/2017 FINDINGS: Right-sided central venous port tip obscured by overlying support devices, catheter seen to the cavoatrial region. Interim insertion of left-sided pacing device with  leads projecting over right atrium and right ventricle. Negative for a pneumothorax. Cardiomegaly with vascular congestion and mild interstitial edema. Patchy atelectasis at the bases. No large pleural effusion. Displaced right humeral neck fracture with callus. IMPRESSION: 1. Insertion of left-sided  pacing device as above. Negative for pneumothorax 2. Cardiomegaly with vascular congestion and mild interstitial edema. Patchy atelectasis at the right base 3. Right humeral neck fracture with callus formation. Electronically Signed   By: Donavan Foil M.D.   On: 12/07/2017 14:09   Dg C-arm 1-60 Min-no Report  Result Date: 12/07/2017 Fluoroscopy was utilized by the requesting physician.  No radiographic interpretation.     ASSESSMENT AND PLAN:   72 yo male w/ hx of Peripheral neuropathy, hx of Mantle cell lymphoma, hypertension, hyperlipidemia, history of Graves' disease, gastroparesis, diabetes, depression who presented to the hospital due to a syncopal episode and noted to have significant bradycardia.  1. Syncope-secondary to significant bradycardia. -Seen by cardiology and patient is now status post permanent pacemaker placement postop day #1 today. Continue further care as per cardiology.  2. Urinary tract infection-based off the urinalysis. -Continue Unasyn, await urine cultures.  3. Chronic lower extremity edema/lymphedema-seen by wound care and continue local wound care. Also seen by infectious disease and continue IV Unasyn for now, will switch to oral Augmentin upon discharge.  4. Hypothyroidism-continue Synthroid. Next  5. Essential hypertension-continue lisinopril.  6. GERD-continue Protonix.  7. Hyperlipidemia-continue Pravachol.  8. Diabetes type 2 without complication-continue patient's insulin pump, blood sugar stable.    All the records are reviewed and case discussed with Care Management/Social Worker. Management plans discussed with the patient, family and they are in agreement.  CODE STATUS: Full  DVT Prophylaxis: Lovenox  TOTAL TIME TAKING CARE OF THIS PATIENT: 30 minutes.   POSSIBLE D/C IN 1-2 DAYS, DEPENDING ON CLINICAL CONDITION.   Henreitta Leber M.D on 12/08/2017 at 3:11 PM  Between 7am to 6pm - Pager - [phone]  After 6pm go to  [email or web address] - Proofreader  [facility]  Office  [phone]  CC: Primary care physician; [name], MD

## 2017-12-08 NOTE — Plan of Care (Signed)
Patient left leg wound is bleeding through the dressing. Reinforced dressing. Patient has general edema, have MD review labs and promote activity.

## 2017-12-09 ENCOUNTER — Inpatient Hospital Stay: Payer: Medicare Other

## 2017-12-09 ENCOUNTER — Other Ambulatory Visit: Payer: Self-pay | Admitting: *Deleted

## 2017-12-09 LAB — CBC
HEMATOCRIT: 33 % — AB (ref 40.0–52.0)
HEMOGLOBIN: 10.9 g/dL — AB (ref 13.0–18.0)
MCH: 29.6 pg (ref 26.0–34.0)
MCHC: 33 g/dL (ref 32.0–36.0)
MCV: 89.5 fL (ref 80.0–100.0)
Platelets: 101 10*3/uL — ABNORMAL LOW (ref 150–440)
RBC: 3.69 MIL/uL — ABNORMAL LOW (ref 4.40–5.90)
RDW: 16.9 % — AB (ref 11.5–14.5)
WBC: 3.7 10*3/uL — ABNORMAL LOW (ref 3.8–10.6)

## 2017-12-09 LAB — GLUCOSE, CAPILLARY
GLUCOSE-CAPILLARY: 76 mg/dL (ref 65–99)
GLUCOSE-CAPILLARY: 154 mg/dL — AB (ref 65–99)
GLUCOSE-CAPILLARY: 133 mg/dL — AB (ref 65–99)
Glucose-Capillary: 106 mg/dL — ABNORMAL HIGH (ref 65–99)

## 2017-12-09 LAB — BASIC METABOLIC PANEL
Anion gap: 6 (ref 5–15)
BUN: 14 mg/dL (ref 6–20)
CALCIUM: 7.9 mg/dL — AB (ref 8.9–10.3)
CHLORIDE: 108 mmol/L (ref 101–111)
CO2: 27 mmol/L (ref 22–32)
CREATININE: 0.81 mg/dL (ref 0.61–1.24)
GFR calc Af Amer: >60 mL/min (ref >60)
GFR calc non Af Amer: >60 mL/min (ref >60)
GLUCOSE: 117 mg/dL — AB (ref 65–99)
Potassium: 4.2 mmol/L (ref 3.5–5.1)
Sodium: 141 mmol/L (ref 135–145)

## 2017-12-09 LAB — AEROBIC CULTURE W GRAM STAIN (SUPERFICIAL SPECIMEN)

## 2017-12-09 LAB — AEROBIC CULTURE  (SUPERFICIAL SPECIMEN)

## 2017-12-09 MED ORDER — GUAIFENESIN-DM 100-10 MG/5ML PO SYRP: 5.0000 mL | ORAL_SOLUTION | ORAL | Status: DC | PRN

## 2017-12-09 MED ORDER — FUROSEMIDE 20 MG PO TABS
20.0000 mg | ORAL_TABLET | Freq: Every day | ORAL | Status: DC
  Administered 2017-12-09 – 2017-12-11 (×3): 20 mg via ORAL
  Filled 2017-12-09 (×3): qty 1

## 2017-12-09 MED ORDER — CARVEDILOL 3.125 MG PO TABS
3.1250 mg | ORAL_TABLET | Freq: Two times a day (BID) | ORAL | Status: DC
  Administered 2017-12-09 – 2017-12-11 (×4): 3.125 mg via ORAL
  Filled 2017-12-09 (×4): qty 1

## 2017-12-09 NOTE — Evaluation (Signed)
Physical Therapy Evaluation Patient Details Name: Gary [name] MRN: [id number] DOB: [birth year] Today's Date: 12/09/2017   History of Present Illness  Pt is a 72 y/o M who presented to the ED and was found to be bradycardic.  PPM placed on 2/26.  Pt also with UTI. Pt's PMH includes lymphoma, peripheral neuropathy, graves disease, portacath insertion, chronic LLE wound.    Clinical Impression  Pt admitted with above diagnosis. Pt currently with functional limitations due to the deficits listed below (see PT Problem List). Gary [name] demonstrates BUE and BLE weakness.  He fatigues quickly with any activity.  He currently requires up to min assist with sit<>stand transfers and min assist to ambulate short distances in his room. Pt is at a high risk of falling given his instability and weakness. Given pt's current mobility status, recommending SNF at d/c. Pt will benefit from skilled PT to increase their independence and safety with mobility to allow discharge to the venue listed below.      Follow Up Recommendations SNF    Equipment Recommendations  3in1 (PT)    Recommendations for Other Services       Precautions / Restrictions Precautions Precautions: Fall;ICD/Pacemaker(PPM placed 2/26) Precaution Comments: Instructed pt in not pushing or pulling heavily Restrictions Weight Bearing Restrictions: No      Mobility  Bed Mobility Overal bed mobility: Needs Assistance Bed Mobility: Supine to Sit     Supine to sit: Min guard;HOB elevated     General bed mobility comments: Increased effort and time and pt uses bed rail to assist in pulling up to sitting  Transfers Overall transfer level: Needs assistance Equipment used: Rolling walker (2 wheeled);Straight cane Transfers: Sit to/from Stand Sit to Stand: Min assist;Min guard         General transfer comment: Pt requires min assist to stand using SPC due to unsteadiness and poor power up.  Pt requires close min guard assist for  sit>stand with RW with improved stability.   Ambulation/Gait Ambulation/Gait assistance: Min assist;Min guard Ambulation Distance (Feet): 30 Feet(15, 15) Assistive device: Straight cane;Rolling walker (2 wheeled) Gait Pattern/deviations: Decreased stride length;Shuffle;Antalgic;Trunk flexed Gait velocity: decreased Gait velocity interpretation: <1.8 ft/sec, indicative of risk for recurrent falls General Gait Details: Pt ambulates first 15 ft with SPC and pt reaching out for bed rail for support and requires min assist due to unsteadiness.  After ambulating 10 ft reports lightheadedness.  BP taken in sitting reading 138/59.  Pt then ambulated 15 ft with RW with improved stability, requiring close min guard assist as pt still demonstrates some unsteadiness.   Stairs            Wheelchair Mobility    Modified Rankin (Stroke Patients Only)       Balance Overall balance assessment: Needs assistance;History of Falls Sitting-balance support: No upper extremity supported;Feet supported Sitting balance-Leahy Scale: Fair     Standing balance support: Single extremity supported;During functional activity Standing balance-Leahy Scale: Poor Standing balance comment: Pt relies on at least 1UE support for static and dynamic activities                             Pertinent Vitals/Pain Pain Assessment: Faces Faces Pain Scale: Hurts even more Pain Location: BLEs Pain Descriptors / Indicators: Discomfort;Grimacing;Guarding Pain Intervention(s): Limited activity within patient's tolerance;Monitored during session;Repositioned    Home Living Family/patient expects to be discharged to:: Private residence Living Arrangements: Spouse/significant other Available Help at Discharge: Family;Available  24 hours/day Type of Home: House Home Access: Ramped entrance     Home Layout: One level Home Equipment: Walker - 2 wheels;Cane - single point      Prior Function Level of  Independence: Needs assistance   Gait / Transfers Assistance Needed: Pt ambulates with SPC most of the time.  He has had several falls over the past 6 months.   ADL's / Homemaking Assistance Needed: Pt requires assist for UE dressing due to RUE injury ~6 months ago.          Hand Dominance        Extremity/Trunk Assessment   Upper Extremity Assessment Upper Extremity Assessment: RUE deficits/detail;LUE deficits/detail RUE Deficits / Details: Strength grossly 3-/5, edema present LUE Deficits / Details: Strength grossly 3+/5, edema present    Lower Extremity Assessment Lower Extremity Assessment: (BLE strength grossly 3+/5)       Communication   Communication: Other (comment)(dysarthria)  Cognition Arousal/Alertness: Awake/alert Behavior During Therapy: WFL for tasks assessed/performed Overall Cognitive Status: Within Functional Limits for tasks assessed                                        General Comments General comments (skin integrity, edema, etc.): HR remains stable throughout session.  SpO2 remains at or above 94% on RA throughout session. RN notified.     Exercises Other Exercises Other Exercises: Encouraged pt to ambulate in room at least 3x/day with nursing staff, RN and pt verbalized understanding.    Assessment/Plan    PT Assessment Patient needs continued PT services  PT Problem List Decreased strength;Decreased activity tolerance;Decreased balance;Decreased mobility;Decreased knowledge of use of DME;Decreased safety awareness;Cardiopulmonary status limiting activity;Pain;Obesity       PT Treatment Interventions DME instruction;Gait training;Functional mobility training;Therapeutic activities;Therapeutic exercise;Balance training;Neuromuscular re-education;Patient/family education;Wheelchair mobility training    PT Goals (Current goals can be found in the Care Plan section)  Acute Rehab PT Goals Patient Stated Goal: to improve  independence PT Goal Formulation: With patient Time For Goal Achievement: 12/23/17 Potential to Achieve Goals: Good    Frequency Min 2X/week   Barriers to discharge Decreased caregiver support(Wife unable to provide level of assist pt currently requires)      Co-evaluation               AM-PAC PT "6 Clicks" Daily Activity  Outcome Measure Difficulty turning over in bed (including adjusting bedclothes, sheets and blankets)?: A Lot Difficulty moving from lying on back to sitting on the side of the bed? : Unable Difficulty sitting down on and standing up from a chair with arms (e.g., wheelchair, bedside commode, etc,.)?: Unable Help needed moving to and from a bed to chair (including a wheelchair)?: A Little Help needed walking in hospital room?: A Little Help needed climbing 3-5 steps with a railing? : Total 6 Click Score: 11    End of Session Equipment Utilized During Treatment: Gait belt Activity Tolerance: Patient limited by fatigue Patient left: in chair;with call bell/phone within reach;with chair alarm set Nurse Communication: Mobility status;Other (comment)(SpO2, BP, lightheadedness) PT Visit Diagnosis: Muscle weakness (generalized) (M62.81);History of falling (Z91.81);Unsteadiness on feet (R26.81);Other abnormalities of gait and mobility (R26.89);Difficulty in walking, not elsewhere classified (R26.2)    Time: 7412-8786 PT Time Calculation (min) (ACUTE ONLY): 37 min   Charges:   PT Evaluation $PT Eval Moderate Complexity: 1 Mod PT Treatments $Gait Training: 8-22 mins $Therapeutic Activity: 8-22  mins   PT G Codes:        Collie Siad PT, DPT 12/09/2017, 12:32 PM

## 2017-12-09 NOTE — Progress Notes (Signed)
Pt original CBG was 118, this did not flow over from the monitor, patient had new reading of 129, that was not synced, pt gave 6.7 units of insulin.

## 2017-12-09 NOTE — Progress Notes (Signed)
[facility] [place] NAME: Gary [name]    MR#:  [id number]  DATE OF BIRTH:  04/05/1946  SUBJECTIVE:   Patient here due to syncope secondary from significant bradycardia status post pacemaker placement POD # 2. Remains quite weak and deconditioned and seen by PT and they recommend SNF/STR.   REVIEW OF SYSTEMS:    Review of Systems  Constitutional: Negative for chills and fever.  HENT: Negative for congestion and tinnitus.   Eyes: Negative for blurred vision and double vision.  Respiratory: Negative for cough, shortness of breath and wheezing.   Cardiovascular: Negative for chest pain, orthopnea and PND.  Gastrointestinal: Negative for abdominal pain, diarrhea, nausea and vomiting.  Genitourinary: Negative for dysuria and hematuria.  Neurological: Negative for dizziness, sensory change and focal weakness.  All other systems reviewed and are negative.   Nutrition: Heart Healthy Tolerating Diet: yes Tolerating PT: Eval noted.   DRUG ALLERGIES:   Allergies  Allergen Reactions  . Rofecoxib Nausea Only    VITALS:  Blood pressure (!) 170/76, pulse 92, temperature (!) 97.5 F (36.4 C), temperature source Oral, resp. rate 20, height 5\' 10"  (1.778 m), weight 120.2 kg (264 lb 14.4 oz), SpO2 100 %.  PHYSICAL EXAMINATION:   Physical Exam  GENERAL:  72 y.o.-year-old patient lying in bed lethargic but follows commands.  EYES: Pupils equal, round, reactive to light and accommodation. No scleral icterus. Extraocular muscles intact.  HEENT: Head atraumatic, normocephalic. Oropharynx and nasopharynx clear.  NECK:  Supple, no jugular venous distention. No thyroid enlargement, no tenderness.  LUNGS: Poor Resp. effort, no wheezing, rales, rhonchi. No use of accessory muscles of respiration.  CARDIOVASCULAR: S1, S2 normal. No murmurs, rubs, or gallops.  Left chest wall PPM in place with no acute bleeding.  ABDOMEN: Soft, nontender, nondistended. Bowel  sounds present. No organomegaly or mass.  EXTREMITIES: No cyanosis, clubbing, +1-2 edema b/l.  B/l Upper Ext. Edema R>L.    NEUROLOGIC: Cranial nerves II through XII are intact. No focal Motor or sensory deficits b/l.  Globally weak.  PSYCHIATRIC: The patient is alert and oriented x 3.  SKIN: No obvious rash, lesion, or ulcer.    LABORATORY PANEL:   CBC Recent Labs  Lab 12/09/17 0515  WBC 3.7*  HGB 10.9*  HCT 33.0*  PLT 101*   ------------------------------------------------------------------------------------------------------------------  Chemistries  Recent Labs  Lab 12/04/17 0542  12/09/17 0515  NA 141   < > 141  K 4.9   < > 4.2  CL 110   < > 108  CO2 23   < > 27  GLUCOSE 225*   < > 117*  BUN 28*   < > 14  CREATININE 1.59*   < > 0.81  CALCIUM 8.4*   < > 7.9*  MG 2.0  --   --    < > = values in this interval not displayed.   ------------------------------------------------------------------------------------------------------------------  Cardiac Enzymes Recent Labs  Lab 12/03/17 1524  TROPONINI <0.03   ------------------------------------------------------------------------------------------------------------------  RADIOLOGY:  No results found.   ASSESSMENT AND PLAN:   72 yo male w/ hx of Peripheral neuropathy, hx of Mantle cell lymphoma, hypertension, hyperlipidemia, history of Graves' disease, gastroparesis, diabetes, depression who presented to the hospital due to a syncopal episode and noted to have significant bradycardia.  1. Syncope-secondary to significant bradycardia. -Seen by cardiology and patient is now status post permanent pacemaker placement postop day #2 today.  - no further syncope or bradycardia. Hemodynamically stable.  2. Urinary tract infection-based off the urinalysis. -Continue Unasyn, cultures so far (-).   3. Chronic lower extremity edema/lymphedema-seen by wound care and continue local wound care. Also seen by infectious  disease and continue IV Unasyn for now, will switch to oral Augmentin upon discharge.  4. Hypothyroidism-continue Synthroid.   5. Essential hypertension-continue lisinopril.  6. GERD-continue Protonix.  7. Hyperlipidemia-continue Pravachol.  8. Diabetes type 2 without complication-continue patient's insulin pump but BS were a bit low this a.m  - will get Diabetes coordinator consult to help with BS management.   9. Mantle cell lymphoma-patient is followed by Dr. [name], currently undergoing chemotherapy in radiation. Treatment on hold due to acute illness. Follow-up with oncology as an outpatient once acute illness is resolved.  10. Bilateral upper extremity edema-patient has some significant right upper extremity edema with warmth. We'll get Dopplers to rule out DVT.   Seen by PT and pt. Is deconditioned and will benefit from SNF/STR. Social Work made aware.   All the records are reviewed and case discussed with Care Management/Social Worker. Management plans discussed with the patient, family and they are in agreement.  CODE STATUS: Full  DVT Prophylaxis: Lovenox  TOTAL TIME TAKING CARE OF THIS PATIENT: 30 minutes.   POSSIBLE D/C IN 1-2 DAYS, DEPENDING ON CLINICAL CONDITION.   Henreitta Leber M.D on 12/09/2017 at 2:01 PM  Between 7am to 6pm - Pager - [phone]  After 6pm go to [email or web address] - Proofreader  [facility] [place] [facility]  Office  [phone]  CC: Primary care physician; [name], MD

## 2017-12-09 NOTE — Patient Outreach (Signed)
[facility]) Care Management  12/09/2017  Gary [name] 08/28/1946 [id number]   Late entry closure entry. Patient has been admitted to hospital. Health coach services will no longer be involved.  Plan: Discipline closure letter sent to physician.   [facility] Care Management [phone]

## 2017-12-10 LAB — GLUCOSE, CAPILLARY
GLUCOSE-CAPILLARY: 179 mg/dL — AB (ref 65–99)
GLUCOSE-CAPILLARY: 182 mg/dL — AB (ref 65–99)
Glucose-Capillary: 118 mg/dL — ABNORMAL HIGH (ref 65–99)
Glucose-Capillary: 148 mg/dL — ABNORMAL HIGH (ref 65–99)
Glucose-Capillary: 154 mg/dL — ABNORMAL HIGH (ref 65–99)
Glucose-Capillary: 184 mg/dL — ABNORMAL HIGH (ref 65–99)

## 2017-12-10 MED ORDER — AMOXICILLIN-POT CLAVULANATE 875-125 MG PO TABS
1.0000 | ORAL_TABLET | Freq: Two times a day (BID) | ORAL | Status: DC
  Administered 2017-12-10 – 2017-12-11 (×3): 1 via ORAL
  Filled 2017-12-10 (×3): qty 1

## 2017-12-10 MED ORDER — LOPERAMIDE HCL 2 MG PO CAPS
4.0000 mg | ORAL_CAPSULE | ORAL | Status: DC | PRN
  Administered 2017-12-10 – 2017-12-11 (×2): 4 mg via ORAL
  Filled 2017-12-10 (×2): qty 2

## 2017-12-10 NOTE — Progress Notes (Signed)
Pharmacy Antibiotic Note  Gary [name] is a 72 y.o. male admitted on 12/02/2017. Pharmacy has been consulted for Unasyn dosing. Patient is being treated for left post calf wound infection. Patient has medical history significant for mantle cell lymphoma - with portacath placement, lymphedema, chronic venous stasis, obesity, and diabetes. Patient is to receive permanent pacemaker after being cleared by ID.   Plan: Continue Unasyn 3 gm IV Q6H for now and recommend switching to Augmentin.  Susceptibilities are back for enterococcus isolated from right foot wound - amp/gent/vanc susceptible. Per ID note from 2/27 likely switch to oral therapy when sensitivities back.   Augmentin 875 mg po BID is appropriate step down from Unasyn for DM wound with enterococcus. Likely 7 to 10 days duration with absolute duration based on wound response.    Height: 5\' 10"  (177.8 cm) Weight: 264 lb 14.4 oz (120.2 kg) IBW/kg (Calculated) : 73  Temp (24hrs), Avg:98.1 F (36.7 C), Min:97.6 F (36.4 C), Max:98.2 F (36.8 C)  Recent Labs  Lab 12/03/17 0953 12/04/17 0542 12/06/17 0540 12/06/17 2147 12/07/17 0440 12/09/17 0515  WBC 3.6*  --   --  3.4* 2.9* 3.7*  CREATININE 1.45* 1.59* 1.41*  --  1.32* 0.81    Estimated Creatinine Clearance: 111.9 mL/min (by C-G formula based on SCr of 0.81 mg/dL).    Allergies  Allergen Reactions  . Rofecoxib Nausea Only    Antimicrobials this admission: Acyclovir (home medication) 2/22  >>  Ceftriaxone 2/24 >> 2/25 Cefazolin 2/26 >>2/26 Unasyn 2/26 >>  Dose adjustments this admission: N/A  Microbiology results: 2/25 Wound Cx: Rare Enterococcus faecalis  2/24 UCx: NGTD  2/22 MRSA PCR: negative   Thank you for allowing pharmacy to be a part of this patient's care.  Maribeth Jiles A. [place], [place].D., BCPS Clinical Pharmacist 12/10/2017 9:18 AM

## 2017-12-10 NOTE — Care Management Important Message (Signed)
Important Message  Patient Details  Name: Gary [name] MRN: [id number] Date of Birth: Nov 08, 1945   Medicare Important Message Given:  Yes Signed IM notice given    Katrina Stack, RN 12/10/2017, 6:02 PM

## 2017-12-10 NOTE — Progress Notes (Signed)
Inpatient Diabetes Program Recommendations  AACE/ADA: New Consensus Statement on Inpatient Glycemic Control (2015)  Target Ranges:  Prepandial:   less than 140 mg/dL      Peak postprandial:   less than 180 mg/dL (1-2 hours)      Critically ill patients:  140 - 180 mg/dL   Lab Results  Component Value Date   GLUCAP 154 (H) 12/10/2017   HGBA1C 5.6 10/28/2017    Met with patient today to discuss supplies and day to change it his insulin pump site.  He tells me he changes the site every 3-4 days and he has all his supplies at the bedside. He has a little over 100 units left  in the pump.  It is charted he had site changes on 12/03/17 and 12/07/17.  Not likely he will change the site today because of the amount of insulin he still has in the reservoir.  He needs to change it tomorrow- regardless of the amount of insulin left- I have educated him on this.   Gentry Fitz, RN, BA, MHA, CDE Diabetes Coordinator Inpatient Diabetes Program  [phone] (Team Pager) [phone] ([place]) 12/10/2017 2:46 PM

## 2017-12-10 NOTE — NC FL2 (Signed)
[place] LEVEL OF CARE SCREENING TOOL     IDENTIFICATION  Patient Name: Gary [name] Birthdate: 02-11-1946 Sex: male Admission Date (Current Location): 12/02/2017  [place] and [place] Number:  Engineering geologist and Address:  [facility], [address] [place], [place] [address]      Provider Number: [id number]  Attending Physician Name and Address:  Henreitta Leber, MD  Relative Name and Phone Number:  [place], [place] [phone]     Current Level of Care: Hospital Recommended Level of Care: [place] Prior Approval Number:    Date Approved/Denied:   PASRR Number: [id number]  Discharge Plan: SNF    Current Diagnoses: Patient Active Problem List   Diagnosis Date Noted  . Syncope 12/02/2017  . Bradycardia 12/02/2017  . AKI (acute kidney injury) ([place]) 12/02/2017  . Chronic systolic CHF (congestive heart failure) ([place]) 12/02/2017  . Hypoglycemia 10/28/2017  . Goals of care, counseling/discussion 10/24/2017  . Lymphedema 10/20/2017  . Bilateral lower extremity edema 09/16/2017  . Iron deficiency anemia 09/11/2017  . Fall 08/13/2017  . Humerus fracture 08/13/2017  . Anemia 01/12/2017  . Hand laceration 12/16/2015  . Adjustment disorder with depressed mood 08/11/2015  . CAD in native artery 11/02/2014  . Swelling of left lower extremity 08/03/2014  . Mantle cell lymphoma ([place]) 08/03/2014  . History of colonic polyps 04/29/2014  . Irregular heart beat 04/22/2014  . SOB (shortness of breath) 04/22/2014  . Stress 03/21/2014  . GERD (gastroesophageal reflux disease) 10/23/2013  . Gastroparesis 06/25/2013  . Chest pain 04/13/2013  . Thyroid disease 04/13/2013  . Diabetes ([place]) 04/13/2013  . Essential hypertension, benign 04/13/2013  . Hypercholesterolemia 04/13/2013    Orientation RESPIRATION BLADDER Height & Weight     Self, Time, Situation, Place  Normal Continent Weight: 264 lb 14.4 oz (120.2  kg) Height:  5\' 10"  (177.8 cm)  BEHAVIORAL SYMPTOMS/MOOD NEUROLOGICAL BOWEL NUTRITION STATUS      Continent Diet(Cardiac carb modified.)  AMBULATORY STATUS COMMUNICATION OF NEEDS Skin   Limited Assist Verbally Surgical wounds                       Personal Care Assistance Level of Assistance  Bathing, Feeding, Dressing Bathing Assistance: Limited assistance Feeding assistance: Independent Dressing Assistance: Limited assistance     Functional Limitations Info  Sight, Hearing, Speech Sight Info: Adequate Hearing Info: Adequate Speech Info: Adequate    SPECIAL CARE FACTORS FREQUENCY  PT (By licensed PT)     PT Frequency: 5x a week              Contractures Contractures Info: Not present    Additional Factors Info  Code Status, Allergies Code Status Info: Full Code Allergies Info: ROFECOXIB            Current Medications (12/10/2017):  This is the current hospital active medication list Current Facility-Administered Medications  Medication Dose Route Frequency Provider Last Rate Last Dose  . acetaminophen (TYLENOL) tablet 325-650 mg  325-650 mg Oral Q4H PRN Paraschos, Alexander, MD      . acyclovir (ZOVIRAX) 200 MG capsule 400 mg  400 mg Oral Daily Lance Coon, MD   400 mg at 12/10/17 0820  . amoxicillin-clavulanate (AUGMENTIN) 875-125 MG per tablet 1 tablet  1 tablet Oral Q12H Sainani, Vivek J, MD      . carvedilol (COREG) tablet 3.125 mg  3.125 mg Oral BID WC Sainani, Belia Heman, MD   3.125 mg  at 12/10/17 0820  . enoxaparin (LOVENOX) injection 40 mg  40 mg Subcutaneous Q24H Lance Coon, MD   40 mg at 12/09/17 2150  . fentaNYL (SUBLIMAZE) injection 25-50 mcg  25-50 mcg Intravenous Q5 min PRN Martha Clan, MD      . fluticasone [facility]) 50 MCG/ACT nasal spray 2 spray  2 spray Each Nare Daily Dustin Flock, MD   2 spray at 12/10/17 0820  . furosemide (LASIX) tablet 20 mg  20 mg Oral Daily Henreitta Leber, MD   20 mg at 12/10/17 0820  .  guaiFENesin-dextromethorphan (ROBITUSSIN DM) 100-10 MG/5ML syrup 5 mL  5 mL Oral Q4H PRN Henreitta Leber, MD      . insulin pump   Subcutaneous TID AC, HS, 0200 Lance Coon, MD   7.9 each at 12/10/17 [phone]  . levothyroxine (SYNTHROID, LEVOTHROID) tablet 25 mcg  25 mcg Oral QAC breakfast Lance Coon, MD   25 mcg at 12/10/17 [phone]  . lisinopril (PRINIVIL,ZESTRIL) tablet 20 mg  20 mg Oral Daily Lance Coon, MD   20 mg at 12/10/17 0819  . MEDLINE mouth rinse  15 mL Mouth Rinse BID Dustin Flock, MD   15 mL at 12/10/17 0826  . ondansetron (ZOFRAN) tablet 4 mg  4 mg Oral Q6H PRN Lance Coon, MD       Or  . ondansetron [facility]) injection 4 mg  4 mg Intravenous Q6H PRN Lance Coon, MD      . ondansetron [facility]) injection 4 mg  4 mg Intravenous Once PRN Martha Clan, MD      . oxyCODONE-acetaminophen (PERCOCET/ROXICET) 5-325 MG per tablet 1 tablet  1 tablet Oral Q6H PRN Epifanio Lesches, MD   1 tablet at 12/10/17 1106  . pantoprazole (PROTONIX) EC tablet 40 mg  40 mg Oral Daily Lance Coon, MD   40 mg at 12/10/17 0819  . pravastatin (PRAVACHOL) tablet 40 mg  40 mg Oral Corwin Levins, MD   40 mg at 12/09/17 2150  . sodium chloride flush (NS) 0.9 % injection 10-40 mL  10-40 mL Intracatheter Q12H Awilda Bill, NP   10 mL at 12/10/17 0821  . sodium chloride flush (NS) 0.9 % injection 10-40 mL  10-40 mL Intracatheter PRN Awilda Bill, NP   10 mL at 12/08/17 2140     Discharge Medications: Please see discharge summary for a list of discharge medications.  Relevant Imaging Results:  Relevant Lab Results:   Additional Information SSN [id number]  insulin pump 3 times daily with meals, which patient maintains himself.  Kajal Scalici, [place], LCSWA

## 2017-12-10 NOTE — Progress Notes (Signed)
[facility] [place] NAME: Gary [name]    MR#:  [id number]  DATE OF BIRTH:  15-Nov-1945  SUBJECTIVE:   Patient here due to syncope secondary from significant bradycardia status post pacemaker placement POD # 3.  Remains quite weak and deconditioned and complaining of some lower extremity pain today.  Patient's lower extremity wounds were rewrapped today.  Awaiting short-term rehab placement.  REVIEW OF SYSTEMS:    Review of Systems  Constitutional: Negative for chills and fever.  HENT: Negative for congestion and tinnitus.   Eyes: Negative for blurred vision and double vision.  Respiratory: Negative for cough, shortness of breath and wheezing.   Cardiovascular: Negative for chest pain, orthopnea and PND.  Gastrointestinal: Negative for abdominal pain, diarrhea, nausea and vomiting.  Genitourinary: Negative for dysuria and hematuria.  Neurological: Negative for dizziness, sensory change and focal weakness.  All other systems reviewed and are negative.   Nutrition: Heart Healthy Tolerating Diet: yes Tolerating PT: Eval noted.   DRUG ALLERGIES:   Allergies  Allergen Reactions  . Rofecoxib Nausea Only    VITALS:  Blood pressure (!) 153/72, pulse 89, temperature 98.2 F (36.8 C), temperature source Oral, resp. rate (!) 24, height 5\' 10"  (1.778 m), weight 120.2 kg (264 lb 14.4 oz), SpO2 100 %.  PHYSICAL EXAMINATION:   Physical Exam  GENERAL:  72 y.o.-year-old patient lying in bed lethargic but follows commands.  EYES: Pupils equal, round, reactive to light and accommodation. No scleral icterus. Extraocular muscles intact.  HEENT: Head atraumatic, normocephalic. Oropharynx and nasopharynx clear.  NECK:  Supple, no jugular venous distention. No thyroid enlargement, no tenderness.  LUNGS: Poor Resp. effort, no wheezing, rales, rhonchi. No use of accessory muscles of respiration.  CARDIOVASCULAR: S1, S2 normal. No murmurs, rubs, or  gallops.  Left chest wall PPM in place with no acute bleeding.  ABDOMEN: Soft, nontender, nondistended. Bowel sounds present. No organomegaly or mass.  EXTREMITIES: No cyanosis, clubbing, +1-2 lower ext edema b/l and legs wrapped in UNNA boots.  B/l Upper Ext. Edema R>L.    NEUROLOGIC: Cranial nerves II through XII are intact. No focal Motor or sensory deficits b/l.  Globally weak.  PSYCHIATRIC: The patient is alert and oriented x 3.  SKIN: No obvious rash, lesion, or ulcer.    LABORATORY PANEL:   CBC Recent Labs  Lab 12/09/17 0515  WBC 3.7*  HGB 10.9*  HCT 33.0*  PLT 101*   ------------------------------------------------------------------------------------------------------------------  Chemistries  Recent Labs  Lab 12/04/17 0542  12/09/17 0515  NA 141   < > 141  K 4.9   < > 4.2  CL 110   < > 108  CO2 23   < > 27  GLUCOSE 225*   < > 117*  BUN 28*   < > 14  CREATININE 1.59*   < > 0.81  CALCIUM 8.4*   < > 7.9*  MG 2.0  --   --    < > = values in this interval not displayed.   ------------------------------------------------------------------------------------------------------------------  Cardiac Enzymes Recent Labs  Lab 12/03/17 1524  TROPONINI <0.03   ------------------------------------------------------------------------------------------------------------------  RADIOLOGY:  US Venous Img Upper Uni Right  Result Date: 12/09/2017 CLINICAL DATA:  Right upper extremity pain and swelling EXAM: RIGHT UPPER EXTREMITY VENOUS DOPPLER ULTRASOUND TECHNIQUE: Gray-scale sonography with graded compression, as well as color Doppler and duplex ultrasound were performed to evaluate the upper extremity deep venous system from the level of the subclavian vein and  including the jugular, axillary, basilic, radial, ulnar and upper cephalic vein. Spectral Doppler was utilized to evaluate flow at rest and with distal augmentation maneuvers. COMPARISON:  None. FINDINGS: Internal  Jugular Vein: No evidence of thrombus. Normal compressibility, respiratory phasicity and response to augmentation. Subclavian Vein: No evidence of thrombus. Normal compressibility, respiratory phasicity and response to augmentation. Axillary Vein: No evidence of thrombus. Normal compressibility, respiratory phasicity and response to augmentation. Cephalic Vein: No evidence of thrombus. Normal compressibility, respiratory phasicity and response to augmentation. Basilic Vein: No evidence of thrombus. Normal compressibility, respiratory phasicity and response to augmentation. Brachial Veins: No evidence of thrombus. Normal compressibility, respiratory phasicity and response to augmentation. Radial Veins: No evidence of thrombus. Normal compressibility, respiratory phasicity and response to augmentation. Ulnar Veins: No evidence of thrombus. Normal compressibility, respiratory phasicity and response to augmentation. Venous Reflux:  None visualized. Other Findings:  None visualized. IMPRESSION: No evidence of DVT within the right upper extremity. Electronically Signed   By: Jerilynn Mages.  Shick M.D.   On: 12/09/2017 16:32     ASSESSMENT AND PLAN:   72 yo male w/ hx of Peripheral neuropathy, hx of Mantle cell lymphoma, hypertension, hyperlipidemia, history of Graves' disease, gastroparesis, diabetes, depression who presented to the hospital due to a syncopal episode and noted to have significant bradycardia.  1. Syncope-secondary to significant bradycardia. -Seen by cardiology and patient is now status post permanent pacemaker placement postop day #3 today.  - no further syncope or bradycardia. Hemodynamically stable.   2. Urinary tract infection-based off the urinalysis. -Continue Unasyn, cultures so far (-).   3. Chronic lower extremity edema/lymphedema-seen by wound care and continue local wound care with UNNA boots.  --Seen by infectious disease and was on IV Unasyn, but will switch to oral Augmentin.  Wound  cultures were positive for enterococcus and sensitive to Augmentin.  4. Hypothyroidism-continue Synthroid.   5. Essential hypertension-continue lisinopril.  6. GERD-continue Protonix.  7. Hyperlipidemia-continue Pravachol.  8. Diabetes type 2 without complication-continue patient's insulin pump and blood sugars are stable.  Seen by diabetes coordinator and cont. Current care for now.   9. Mantle cell lymphoma-patient is followed by Dr. [name], currently undergoing chemotherapy and then he radiation. Treatment on hold due to acute illness. Follow-up with oncology as an outpatient once acute illness is resolved.  10. Bilateral upper extremity edema-patient has some significant right upper extremity edema with warmth.  Dopplers are (-) for DVT. Cont. Supportive care and keep elevated.    Seen by PT and pt. Is deconditioned and will benefit from SNF/STR. Social Work aware and are doing bed search.   All the records are reviewed and case discussed with Care Management/Social Worker. Management plans discussed with the patient, family and they are in agreement.  CODE STATUS: Full  DVT Prophylaxis: Lovenox  TOTAL TIME TAKING CARE OF THIS PATIENT: 25 minutes.   POSSIBLE D/C IN 1-2 DAYS, DEPENDING ON CLINICAL CONDITION.   Henreitta Leber M.D on 12/10/2017 at 1:57 PM  Between 7am to 6pm - Pager - [phone]  After 6pm go to [email or web address] - Proofreader  [facility]  Office  [phone]  CC: Primary care physician; [name], MD

## 2017-12-10 NOTE — Progress Notes (Signed)
Inpatient Diabetes Program Recommendations  AACE/ADA: New Consensus Statement on Inpatient Glycemic Control (2015)  Target Ranges:  Prepandial:   less than 140 mg/dL      Peak postprandial:   less than 180 mg/dL (1-2 hours)      Critically ill patients:  140 - 180 mg/dL   Lab Results  Component Value Date   GLUCAP 179 (H) 12/10/2017   HGBA1C 5.6 10/28/2017    Review of Glycemic Control  Results for Gary [name], Gary [name] (MRN [id number]) as of 12/10/2017 09:54  Ref. Range 12/09/2017 12:13 12/09/2017 17:15 12/09/2017 21:41 12/10/2017 02:19 12/10/2017 08:25  Glucose-Capillary Latest Ref Range: 65 - 99 mg/dL 154 (H) 118 (H) 133 (H) 148 (H) 179 (H)   Diabetes history: DM1 (makes no insulin; requires basal, correction, and meal coverage insulin) Outpatient Diabetes medications: Insulin Pump (total basal is 17.4 units/24 H, sensitivity 1:60 (1 unit drops glucose 60 mg/dl), carb ratio 1:10 (1 unit covers 10 grams of carbohydrates)  Current orders for Inpatient glycemic control: Insulin Pump ACHS & 2 am  Inpatient Diabetes Program Recommendations: Patient should change his pump site today- please make sure patient has his supplies with him at all times.  Gentry Fitz, RN, BA, MHA, CDE Diabetes Coordinator Inpatient Diabetes Program  [phone] (Team Pager) [phone] ([place]) 12/10/2017 9:56 AM

## 2017-12-10 NOTE — Consult Note (Signed)
[facility] Nurse wound follow up Wound type:Nonhealing wound to left posterior calf and bilateral Unna boots.   Measurement: 4 cm x 2.4 cm 0.2 cm less red and no odor today.  Wound bed: red, bleeding Drainage (amount, consistency, odor) minimal bleeding no odor today Periwound: Edema and erythema Dressing procedure/placement/frequency:Cleanse bilateral lower legs with soap and water and pat dry.  Aquacel Ag to wound on left posterior leg.  Wrap with zinc layer and secure with COban. Change twice weekly.  [place] team will follow.  Domenic Moras RN BSN [place] Pager [phone]

## 2017-12-10 NOTE — Plan of Care (Signed)
  Education: Knowledge of General Education information will improve 12/10/2017 1339 - Progressing by Darrelyn Hillock, RN   Education: Knowledge of General Education information will improve 12/10/2017 1339 - Progressing by Darrelyn Hillock, RN   Education: Knowledge of General Education information will improve 12/10/2017 1339 - Progressing by Darrelyn Hillock, RN   Education: Knowledge of General Education information will improve 12/10/2017 1339 - Progressing by Darrelyn Hillock, RN   Education: Knowledge of General Education information will improve 12/10/2017 1339 - Progressing by Darrelyn Hillock, RN   Education: Knowledge of General Education information will improve 12/10/2017 1339 - Progressing by Darrelyn Hillock, RN   Education: Knowledge of General Education information will improve 12/10/2017 1339 - Progressing by Darrelyn Hillock, RN   Education: Knowledge of General Education information will improve 12/10/2017 1339 - Progressing by Darrelyn Hillock, RN   Education: Knowledge of General Education information will improve 12/10/2017 1339 - Progressing by Darrelyn Hillock, RN   Education: Knowledge of General Education information will improve 12/10/2017 1339 - Progressing by Darrelyn Hillock, RN

## 2017-12-11 LAB — GLUCOSE, CAPILLARY
GLUCOSE-CAPILLARY: 218 mg/dL — AB (ref 65–99)
GLUCOSE-CAPILLARY: 211 mg/dL — AB (ref 65–99)

## 2017-12-11 MED ORDER — HEPARIN SOD (PORK) LOCK FLUSH 100 UNIT/ML IV SOLN
500.0000 [IU] | INTRAVENOUS | Status: AC | PRN
  Administered 2017-12-11: 500 [IU]

## 2017-12-11 MED ORDER — AMOXICILLIN-POT CLAVULANATE 875-125 MG PO TABS: 1.0000 | ORAL_TABLET | Freq: Two times a day (BID) | ORAL | 0 refills | Status: DC

## 2017-12-11 NOTE — Clinical Social Work Note (Signed)
The patient has verbalized to the attending MD that he would prefer to return home. The CSW advised the attending MD that as the patient is able to ambulate and has verbalized that he does not wish to go to a SNF, the patient is capable of returning home with home health. The CSW is signing off. Please consult should needs arise.  Santiago Bumpers, MSW, Latanya Presser [phone]

## 2017-12-11 NOTE — Clinical Social Work Note (Signed)
Clinical Social Work Assessment  Patient Details  Name: Gary [name] MRN: [id number] Date of Birth: 11-27-1945  Date of referral:  12/10/17               Reason for consult:  Facility Placement                Permission sought to share information with:  Family Supports, Customer service manager Permission granted to share information::  Yes, Verbal Permission Granted  Name::     Gary [name] [phone]   Agency::  SNF admissions  Relationship::     Contact Information:     Housing/Transportation Living arrangements for the past 2 months:  Single Family Home Source of Information:  Patient Patient Interpreter Needed:  None Criminal Activity/Legal Involvement Pertinent to Current Situation/Hospitalization:  No - Comment as needed Significant Relationships:  Spouse Lives with:  Spouse Do you feel safe going back to the place where you live?  No Need for family participation in patient care:  No (Coment)  Care giving concerns:  Patient does not really want to go to SNF, but he is considering it.  Patient states he just needs help getting in and out of his car.   Social Worker assessment / plan: Patient is a 72 year old male who has cancer, patient is alert and oriented x4.  Patient is married lives with his wife, and states he has not been to SNF for rehab before.  CSW explained role of CSW and process for looking for placement.  Patient states he does not really want to go to rehab because he doesn't know anything about the different facilities, and he does not trust them.  Patient states he was supposed to start his radiation and chemotherapy this week, but ended up in the hospital.  Patient was explained what to expect at SNF and talked about the different options locally.  Patient said he thinks he can go home with home health and improve despite what PT is recommending.  CSW explained the benefits of going to SNF verse going home with home health.  Patient is hesitant  about going to SNF, but will look over the list and discuss with his wife.  CSW was given permission by patient to begin bed search in [facility].  Patient was explained how insurance will pay for his stay at SNF, patient did not express any other questions or concerns.  Employment status:  Retired Forensic scientist:  Medicare PT Recommendations:  [place] / Referral to community resources:  [place]  Patient/Family's Response to care:  Patient is hesitant about going to SNF, but will think about it.  Patient/Family's Understanding of and Emotional Response to Diagnosis, Current Treatment, and Prognosis:  Patient expresses that he feels like he just needs to get home in order to get well again, CSW explained that many people go to SNF first before returning home.  Patient is aware of his current treatment plan and diagnosis.  Emotional Assessment Appearance:  Appears older than stated age Attitude/Demeanor/Rapport:  Apprehensive Affect (typically observed):  Stable Orientation:  Oriented to Self, Oriented to Place, Oriented to  Time, Oriented to Situation Alcohol / Substance use:  Not Applicable Psych involvement (Current and /or in the community):  No (Comment)  Discharge Needs  Concerns to be addressed:  Lack of Support Readmission within the last 30 days:  No Current discharge risk:  Lack of support system Barriers to Discharge:  Continued Medical Work  up   Ross Ludwig, LCSWA 12-10-17 2:30pm

## 2017-12-11 NOTE — Care Management Note (Signed)
Case Management Note  Patient Details  Name: Gary [name] MRN: [id number] Date of Birth: 03-22-1946  Subjective/Objective:      Call to [name], on call nurse at [facility] with a home health referral for HH=PT, RN, [place], SW.               Action/Plan:   Expected Discharge Date:  12/11/17               Expected Discharge Plan:  [name]  In-House Referral:     Discharge planning Services  CM Consult  Post Acute Care Choice:    Choice offered to:  Spouse, Patient  DME Arranged:    DME Agency:     HH Arranged:  PT, OT, Nurse's Aide, Social Work [employer] Agency:  [place]  Status of Service:  Completed, signed off  If discussed at [employer] of Stay Meetings, dates discussed:    Additional Comments:  Shakinah Navis A, RN 12/11/2017, 2:24 PM

## 2017-12-11 NOTE — Discharge Summary (Signed)
[facility] [place] NAME: Gary [name]    MR#:  [id number]  DATE OF BIRTH:  01-May-1946  DATE OF ADMISSION:  12/02/2017 ADMITTING PHYSICIAN: Lance Coon, MD  DATE OF DISCHARGE: No discharge date for patient encounter.  PRIMARY CARE PHYSICIAN: [name], MD    ADMISSION DIAGNOSIS:  Bradycardia [R00.1] Laceration of right foot, initial encounter [S91.311A] Syncope, unspecified syncope type [R55]  DISCHARGE DIAGNOSIS:  Principal Problem:   Syncope Active Problems:   Thyroid disease   Diabetes ([place])   Essential hypertension, benign   GERD (gastroesophageal reflux disease)   CAD in native artery   Bradycardia   AKI (acute kidney injury) ([place])   Chronic systolic CHF (congestive heart failure) ([place])   SECONDARY DIAGNOSIS:   Past Medical History:  Diagnosis Date  . Depression   . 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 Gastroparesis   . Graves disease   . Hypercholesterolemia   . Hypertension   . Mantle cell lymphoma ([place])   . Peripheral neuropathy     HOSPITAL COURSE:  72 yo male w/ hx of Peripheral neuropathy, hx of Mantle cell lymphoma, hypertension, hyperlipidemia, history of Graves' disease, gastroparesis, diabetes, depression who presented to the hospital due to a syncopal episode and noted to have significant bradycardia.  1. Syncope secondary to significant bradycardia. Resolved status post pacemaker placement by cardiology   2. Urinary tract infection Resolved/treated with course of Unasyn  3. Chronic lower extremity edema/lymphedema Improved seen by wound care and continue local wound care with UNNA boots-continue treatment status post discharge to wound care clinic seen by infectious disease and was on IV Unasyn while in house, changed to Augmentin, cultures noted for enterococcus sensitive to Augmentin, to follow-up with infectious disease  status post discharge in 1 week for reevaluation  4. Hypothyroidism Stable on Synthroid.   5. Essential hypertension Stable on lisinopril.  6. GERD Stable on Protonix.  7. Hyperlipidemia Stable on Pravachol.  8. Diabetes type 2 without complication Controlled on current regiment    9. Mantle cell lymphoma, chronic Follow-up with Dr. [name] status post discharge for continued care/management, currently undergoing chemotherapy and then he radiation  10. Bilateral upper extremity edema Improved Dopplers are (-) for DVT Treated with supportive care and arm elevation DISCHARGE CONDITIONS:  On day of discharge patient is afebrile, hemogram stable, tolerating diet, ready for discharge home with appropriate follow-up with infectious disease, primary care provider, wound clinic, for more specific details please see chart   CONSULTS OBTAINED:  Treatment Team:  Corey Skains, MD Yolonda Kida, MD Leonel Ramsay, MD Salary, Avel Peace, MD  DRUG ALLERGIES:   Allergies  Allergen Reactions  . Rofecoxib Nausea Only    DISCHARGE MEDICATIONS:   Allergies as of 12/11/2017      Reactions   Rofecoxib Nausea Only      Medication List    TAKE these medications   ACCU-CHEK AVIVA PLUS test strip Generic drug:  glucose blood USE AS INSTRUCTED. USE 1 STRIP EVERY 3 HOURS . ACCU-CHECK AVIVA PLUS TEST STRIP. DX E10.649   acyclovir 400 MG tablet Commonly known as:  ZOVIRAX Take 1 tablet (400 mg total) by mouth daily.   amoxicillin-clavulanate 875-125 MG tablet Commonly known as:  AUGMENTIN Take 1 tablet by mouth every 12 (twelve) hours.   carvedilol 3.125 MG tablet Commonly known as:  COREG Take 3.125 mg by mouth 2 (two) times  daily with a meal.   ferrous sulfate 325 (65 FE) MG tablet Take 325 mg by mouth daily.   furosemide 20 MG tablet Commonly known as:  LASIX Take 1 tablet (20 mg total) by mouth daily.   GLUCAGON EMERGENCY 1 MG injection Generic  drug:  glucagon   glucose 4 GM chewable tablet Chew 1 tablet by mouth once as needed for low blood sugar.   insulin aspart 100 UNIT/ML injection Commonly known as:  NOVOLOG Basal rate 12am 0.8 units per hour, 9am 0.9 units per hour. (total basal insulin 20.7 units). Carbohydrate ratio 1 units for every 8gm of carbohydrate. Correction factor 1 units for every 50mg /dl over target cbg. Target CBG 80-120   levothyroxine 25 MCG tablet Commonly known as:  SYNTHROID, LEVOTHROID Take 25 mcg by mouth daily.   lidocaine-prilocaine cream Commonly known as:  EMLA Apply 1 application topically as needed. Apply to port prior to chemotherapy appointment, cover with plastic wrap.   lisinopril 20 MG tablet Commonly known as:  PRINIVIL,ZESTRIL Take 20 mg by mouth daily.   multivitamin tablet Take 1 tablet by mouth daily.   naproxen sodium 220 MG tablet Commonly known as:  ALEVE Take 220 mg by mouth daily as needed.   ondansetron 8 MG tablet Commonly known as:  ZOFRAN Take 1 tablet (8 mg total) by mouth 2 (two) times daily as needed for refractory nausea / vomiting.   pantoprazole 40 MG tablet Commonly known as:  PROTONIX TAKE 1 TABLET (40 MG TOTAL) BY MOUTH DAILY.   pravastatin 40 MG tablet Commonly known as:  PRAVACHOL Take 1 tablet (40 mg total) by mouth at bedtime. Will need office visit for any more refills   prochlorperazine 10 MG tablet Commonly known as:  COMPAZINE Take 1 tablet (10 mg total) by mouth every 6 (six) hours as needed (Nausea or vomiting).   spironolactone 25 MG tablet Commonly known as:  ALDACTONE Take 25 mg by mouth daily.        DISCHARGE INSTRUCTIONS:   If you experience worsening of your admission symptoms, develop shortness of breath, life threatening emergency, suicidal or homicidal thoughts you must seek medical attention immediately by calling 911 or calling your MD immediately  if symptoms less severe.  You Must read complete instructions/literature  along with all the possible adverse reactions/side effects for all the Medicines you take and that have been prescribed to you. Take any new Medicines after you have completely understood and accept all the possible adverse reactions/side effects.   Please note  You were cared for by a hospitalist during your hospital stay. If you have any questions about your discharge medications or the care you received while you were in the hospital after you are discharged, you can call the unit and asked to speak with the hospitalist on call if the hospitalist that took care of you is not available. Once you are discharged, your primary care physician will handle any further medical issues. Please note that NO REFILLS for any discharge medications will be authorized once you are discharged, as it is imperative that you return to your primary care physician (or establish a relationship with a primary care physician if you do not have one) for your aftercare needs so that they can reassess your need for medications and monitor your lab values.    Today   CHIEF COMPLAINT:   Chief Complaint  Patient presents with  . Loss of Consciousness  . Laceration    HISTORY OF PRESENT  ILLNESS:  72 y.o. male who presents with couple episodes at home and subsequent toe laceration.  Patient came to the ED for evaluation and was found to be bradycardic.  He has a history of heart failure, but no prior history of persistent bradycardia.  Toe laceration was repaired by ED physician and hospitalist were called for monitoring and further evaluation  VITAL SIGNS:  Blood pressure (!) 152/59, pulse 92, temperature 98.1 F (36.7 C), temperature source Oral, resp. rate 17, height 5\' 10"  (1.778 m), weight 120.2 kg (264 lb 14.4 oz), SpO2 97 %.  I/O:    Intake/Output Summary (Last 24 hours) at 12/11/2017 1147 Last data filed at 12/11/2017 1006 Gross per 24 hour  Intake 840 ml  Output 900 ml  Net -60 ml    PHYSICAL EXAMINATION:   GENERAL:  72 y.o.-year-old patient lying in the bed with no acute distress.  EYES: Pupils equal, round, reactive to light and accommodation. No scleral icterus. Extraocular muscles intact.  HEENT: Head atraumatic, normocephalic. Oropharynx and nasopharynx clear.  NECK:  Supple, no jugular venous distention. No thyroid enlargement, no tenderness.  LUNGS: Normal breath sounds bilaterally, no wheezing, rales,rhonchi or crepitation. No use of accessory muscles of respiration.  CARDIOVASCULAR: S1, S2 normal. No murmurs, rubs, or gallops.  ABDOMEN: Soft, non-tender, non-distended. Bowel sounds present. No organomegaly or mass.  EXTREMITIES: No pedal edema, cyanosis, or clubbing.  NEUROLOGIC: Cranial nerves II through XII are intact. Muscle strength 5/5 in all extremities. Sensation intact. Gait not checked.  PSYCHIATRIC: The patient is alert and oriented x 3.  SKIN: No obvious rash, lesion, or ulcer.   DATA REVIEW:   CBC Recent Labs  Lab 12/09/17 0515  WBC 3.7*  HGB 10.9*  HCT 33.0*  PLT 101*    Chemistries  Recent Labs  Lab 12/09/17 0515  NA 141  K 4.2  CL 108  CO2 27  GLUCOSE 117*  BUN 14  CREATININE 0.81  CALCIUM 7.9*    Cardiac Enzymes No results for input(s): TROPONINI in the last 168 hours.  Microbiology Results  Results for orders placed or performed during the hospital encounter of 12/02/17  MRSA PCR Screening     Status: None   Collection Time: 12/03/17  9:10 PM  Result Value Ref Range Status   MRSA by PCR NEGATIVE NEGATIVE Final    Comment:        The GeneXpert MRSA Assay (FDA approved for NASAL specimens only), is one component of a comprehensive MRSA colonization surveillance program. It is not intended to diagnose MRSA infection nor to guide or monitor treatment for MRSA infections. Performed at [facility], [address], [place], [place] [address]   Urine Culture     Status: None   Collection Time: 12/05/17 11:05 PM  Result Value Ref  Range Status   Specimen Description   Final    URINE, RANDOM Performed at [facility], [address], [place], [place] [address]    Special Requests   Final    NONE Performed at [facility], [address], [place], [place] [address]    Culture   Final    NO GROWTH Performed at [facility], [place] [address] [place], [place] [address]    Report Status 12/07/2017 FINAL  Final  Aerobic Culture (superficial specimen)     Status: None   Collection Time: 12/06/17  2:28 PM  Result Value Ref Range Status   Specimen Description WOUND RIGHT FOOT  Final  Special Requests NONE  Final   Gram Stain   Final    FEW WBC PRESENT, PREDOMINANTLY MONONUCLEAR NO ORGANISMS SEEN Performed at [facility], [place] [address], [place], [place] [address]    Culture RARE ENTEROCOCCUS FAECALIS  Final   Report Status 12/09/2017 FINAL  Final   Organism ID, Bacteria ENTEROCOCCUS FAECALIS  Final      Susceptibility   Enterococcus faecalis - MIC*    AMPICILLIN <=2 SENSITIVE Sensitive     VANCOMYCIN 1 SENSITIVE Sensitive     GENTAMICIN SYNERGY SENSITIVE Sensitive     * RARE ENTEROCOCCUS FAECALIS    RADIOLOGY:  US Venous Img Upper Uni Right  Result Date: 12/09/2017 CLINICAL DATA:  Right upper extremity pain and swelling EXAM: RIGHT UPPER EXTREMITY VENOUS DOPPLER ULTRASOUND TECHNIQUE: Gray-scale sonography with graded compression, as well as color Doppler and duplex ultrasound were performed to evaluate the upper extremity deep venous system from the level of the subclavian vein and including the jugular, axillary, basilic, radial, ulnar and upper cephalic vein. Spectral Doppler was utilized to evaluate flow at rest and with distal augmentation maneuvers. COMPARISON:  None. FINDINGS: Internal Jugular Vein: No evidence of thrombus. Normal compressibility, respiratory phasicity and response to augmentation. Subclavian Vein: No evidence of thrombus. Normal compressibility, respiratory  phasicity and response to augmentation. Axillary Vein: No evidence of thrombus. Normal compressibility, respiratory phasicity and response to augmentation. Cephalic Vein: No evidence of thrombus. Normal compressibility, respiratory phasicity and response to augmentation. Basilic Vein: No evidence of thrombus. Normal compressibility, respiratory phasicity and response to augmentation. Brachial Veins: No evidence of thrombus. Normal compressibility, respiratory phasicity and response to augmentation. Radial Veins: No evidence of thrombus. Normal compressibility, respiratory phasicity and response to augmentation. Ulnar Veins: No evidence of thrombus. Normal compressibility, respiratory phasicity and response to augmentation. Venous Reflux:  None visualized. Other Findings:  None visualized. IMPRESSION: No evidence of DVT within the right upper extremity. Electronically Signed   By: Jerilynn Mages.  Shick M.D.   On: 12/09/2017 16:32    EKG:   Orders placed or performed during the hospital encounter of 12/02/17  . EKG 12-Lead  . EKG 12-Lead  . ED EKG  . ED EKG  . EKG 12-Lead  . EKG 12-Lead  . EKG 12-Lead  . EKG 12-Lead  . EKG 12-Lead in am (before 8am)  . EKG 12-Lead in am (before 8am)      Management plans discussed with the patient, family and they are in agreement.  CODE STATUS:     Code Status Orders  (From admission, onward)        Start     Ordered   12/03/17 0235  Full code  Continuous     12/03/17 0235    Code Status History    Date Active Date Inactive Code Status Order ID Comments User Context   10/28/2017 17:20 10/29/2017 18:16 Full Code [id number]  Dustin Flock, MD Inpatient   01/12/2017 02:38 01/13/2017 20:44 Full Code [id number]  Hugelmeyer, Ubaldo Glassing, DO Inpatient      TOTAL TIME TAKING CARE OF THIS PATIENT: 45 minutes.    Avel Peace Salary M.D on 12/11/2017 at 11:47 AM  Between 7am to 6pm - Pager - [phone]  After 6pm go to [email or web address] - password [email or web address] [facility]  Office  [phone]  CC: Primary care physician; [name], MD   Note: This dictation was prepared with Dragon dictation along with smaller phrase technology. Any transcriptional errors that result from this  process are unintentional.

## 2017-12-13 ENCOUNTER — Other Ambulatory Visit: Payer: Self-pay

## 2017-12-14 ENCOUNTER — Telehealth: Payer: Self-pay | Admitting: Internal Medicine

## 2017-12-14 NOTE — Telephone Encounter (Signed)
Transition Care Management Follow-up Telephone Call How have you been since you were released from the hospital? Patient   says he feels better just still feels tired , especially in the afternoon.  Do you understand why you were in the hospital? yes   Do you understand the discharge instrcutions? yes  Items Reviewed:  Medications reviewed: yes  Allergies reviewed: yes  Dietary changes reviewed: yes  Referrals reviewed: yes   Functional Questionnaire:   Activities of Daily Living (ADLs):   He states they are independent in the following: ambulation, bathing and hygiene, feeding, continence, grooming, toileting and dressing States they require assistance with the following: Stated no asistance needed at this time.   Any transportation issues/concerns?: no   Any patient concerns? yes   Confirmed importance and date/time of follow-up visits scheduled: yes   Confirmed with patient if condition begins to worsen call PCP or go to the ER.  Patient was given the Call-a-Nurse line [phone]: yes

## 2017-12-15 ENCOUNTER — Encounter (INDEPENDENT_AMBULATORY_CARE_PROVIDER_SITE_OTHER): Payer: Self-pay | Admitting: Vascular Surgery

## 2017-12-15 ENCOUNTER — Other Ambulatory Visit: Payer: Self-pay | Admitting: *Deleted

## 2017-12-15 ENCOUNTER — Ambulatory Visit (INDEPENDENT_AMBULATORY_CARE_PROVIDER_SITE_OTHER): Payer: Medicare Other | Admitting: Vascular Surgery

## 2017-12-15 VITALS — BP 150/71 | HR 62 | Resp 19 | Ht 69.0 in | Wt 250.0 lb

## 2017-12-15 DIAGNOSIS — I2510 Atherosclerotic heart disease of native coronary artery without angina pectoris: Secondary | ICD-10-CM | POA: Diagnosis not present

## 2017-12-15 DIAGNOSIS — E7800 Pure hypercholesterolemia, unspecified: Secondary | ICD-10-CM | POA: Diagnosis not present

## 2017-12-15 DIAGNOSIS — I890 Lymphedema, not elsewhere classified: Secondary | ICD-10-CM | POA: Diagnosis not present

## 2017-12-15 DIAGNOSIS — E1151 Type 2 diabetes mellitus with diabetic peripheral angiopathy without gangrene: Secondary | ICD-10-CM | POA: Diagnosis not present

## 2017-12-15 NOTE — Patient Outreach (Signed)
Successful telephone encounter to Gary [name] for follow up on referral received 12/13/17 from [facility] hospital liaison for  Transition of care/recent hospitalization February 21-March 2,2019 for Bradycardia, syncope, Laceration right foot. View in EMR PCP (Dr. [name]) office did transition of care call yesterday so will not do transition of care calls.   RN CM was informed pt was previously followed by [facility] health coach.  Spoke with pt, HIPAA identifiers provided,informed RN CM on his way to the doctors.   RN CM discussed purpose of call- follow up on referral, will call pt back later today.        Plan:  As discussed with pt, plan to follow up again later today, discuss Community CM services.   Zara Chess.   [facility]  [phone]

## 2017-12-15 NOTE — Patient Outreach (Signed)
Successful telephone encounter to Gary [name], 72 year old male- follow up on referral received 12/13/17 from [facility] hospital liaison  For Community CM services- recent hospitalization February 21 to March 2,2019 for  Bradycardia, syncope, laceration of right foot, Pacemaker insert 12/07/17.  View in EMR  PCP office does Transition of care call/done on 12/14/17.  Pt was previously followed by [facility] for disease management.   Spoke with pt, HIPAA identifiers  Verified.   Pt reports doing good since discharge home, saw Vascular MD today- took his wraps off (bilateral legs), Swelling down, to use compression stockings.   Pt reports he has his Lymphedema pump, about to do it soon,  was told to use once a day.   Pt reports declined home health.   Pt reports no pain, sore in arms/shoulders (pacemaker site),no dizziness.  Medications:  Pt reports taking all of his medications. Follow up MD appointments:  Reviewed with pt follow up MD visits post discharge instructions to which       Pt reports to see PCP tomorrow, need to set up appointments with Dr. [name] and Dr. Ola Spurr.  RN CM discussed with pt [facility] Community CM services- provide home visits at no cost/benefit of his      Insurance to which pt declined.  Discussed also referring back to [place] coach to follow up      Telephonically to which pt also declined.  Provided pt with RN CM's contact information to call if      Needs arise in the future.    Plan:  Plan to close pt's case due to pt declining [facility] services at this time.            Plan to inform PCP pt declined [facility] services.            Plan to inform [facility] CMA to close case.   Zara Chess.   [facility]  [phone]

## 2017-12-15 NOTE — Progress Notes (Signed)
Subjective:    [name], male    DOB: Jan 30, 1946, 72 y.o.   MRN: [id number] Chief Complaint  Patient presents with  . Follow-up    Unna boot check   The patient presents for a monthly lymphedema/wound follow-up.  The patient has been treated with bilateral 3 layer zinc oxide Unna wraps for many months due to a lymphedema exacerbation with ulcer development.  He presents today with improvement to his lower extremity edema and his ulcerations are healed.  He was seen with his wife.  The patient has purchased a pair of medical grade 1 compression stockings.  The patient is also received his lymphedema pump.  The patient denies any worsening lower extremity claudication, rest pain or new development of ulcerations.  The patient denies any fever, nausea vomiting.   Review of Systems  Constitutional: Negative.   HENT: Negative.   Eyes: Negative.   Respiratory: Negative.   Cardiovascular:       Lymphedema  Gastrointestinal: Negative.   Endocrine: Negative.   Genitourinary: Negative.   Musculoskeletal: Negative.   Skin: Negative.   Allergic/Immunologic: Negative.   Neurological: Negative.   Hematological: Negative.   Psychiatric/Behavioral: Negative.       Objective:   Physical Exam  Constitutional: He is oriented to person, place, and time. He appears well-developed and well-nourished.  HENT:  Head: Normocephalic and atraumatic.  Eyes: Conjunctivae are normal. Pupils are equal, round, and reactive to light.  Neck: Normal range of motion.  Cardiovascular: Normal rate, regular rhythm, normal heart sounds and intact distal pulses.  Pulses:      Radial pulses are 2+ on the right side, and 2+ on the left side.  Hard to palpate pedal pulses however the bilateral feet are warm  Pulmonary/Chest: Effort normal and breath sounds normal.  Musculoskeletal: Normal range of motion. He exhibits edema (Minimal to no edema noted to the bilateral lower extremity).  Neurological: He  is alert and oriented to person, place, and time.  Skin: Skin is warm and dry. He is not diaphoretic.  Bilateral ulcerations to the lower extremity have healed.  The skin is intact.  There is no cellulitis.  Psychiatric: He has a normal mood and affect. His behavior is normal. Judgment and thought content normal.  Vitals reviewed.  BP (!) 150/71 (BP Location: Right Arm, Patient Position: Sitting)   Pulse 62   Resp 19   Ht 5\' 9"  (1.753 m)   Wt 250 lb (113.4 kg)   BMI 36.92 kg/m   Past Medical History:  Diagnosis Date  . Depression   . 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 Gastroparesis   . Graves disease   . Hypercholesterolemia   . Hypertension   . Mantle cell lymphoma ([place])   . Peripheral neuropathy    Social History   Socioeconomic History  . Marital status: Married    Spouse name: Not on file  . Number of children: 1  . Years of education: Not on file  . Highest education level: Not on file  Social Needs  . Financial resource strain: Not on file  . Food insecurity - worry: Not on file  . Food insecurity - inability: Not on file  . Transportation needs - medical: Not on file  . Transportation needs - non-medical: Not on file  Occupational History  . Not on file  Tobacco Use  . Smoking status: Former Research scientist (life sciences)  . Smokeless tobacco:  Current User    Types: Chew  Substance and Sexual Activity  . Alcohol use: Yes    Alcohol/week: 1.8 oz    Types: 3 Cans of beer per week    Comment: nightly  . Drug use: No  . Sexual activity: Not Currently  Other Topics Concern  . Not on file  Social History Narrative  . Not on file   Past Surgical History:  Procedure Laterality Date  . CHOLECYSTECTOMY    . NEPHRECTOMY     right  . PACEMAKER INSERTION N/A 12/07/2017   Procedure: INSERTION PACEMAKER DUAL CHAMBER INITIAL INSERT;  Surgeon: Isaias Cowman, MD;  Location: [facility];  Service: Cardiovascular;  Laterality:  N/A;  . PORTA CATH INSERTION N/A 11/03/2017   Procedure: PORTA CATH INSERTION;  Surgeon: Katha Cabal, MD;  Location: [place] [facility];  Service: Cardiovascular;  Laterality: N/A;   Family History  Problem Relation Age of Onset  . Breast cancer Mother   . Diabetes Father   . Cancer Father        Lymphoma  . Alcohol abuse Maternal Uncle   . Diabetes Sister   . Heart disease Unknown        grandfather   Allergies  Allergen Reactions  . Rofecoxib Nausea Only      Assessment & Plan:  The patient presents for a monthly lymphedema/wound follow-up.  The patient has been treated with bilateral 3 layer zinc oxide Unna wraps for many months due to a lymphedema exacerbation with ulcer development.  He presents today with improvement to his lower extremity edema and his ulcerations are healed.  He was seen with his wife.  The patient has purchased a pair of medical grade 1 compression stockings.  The patient is also received his lymphedema pump.  The patient denies any worsening lower extremity claudication, rest pain or new development of ulcerations.  The patient denies any fever, nausea vomiting.  1. Lymphedema - Stable After many months and 3 layer zinc oxide Unna wraps to the bilateral lower extremity the patient presents today with a market improvement to his edema. The patient's ulcerations are now completely healed The patient will transition into a pair of medical grade 1 compression socks which be placed in the morning removed in the evening. The patient was encouraged to elevate his legs heart level or higher The patient was encouraged to use his lymphedema pump at least twice a day for an hour each time The patient is to follow-up in 3 months so I can assess his progress The patient is to call the office sooner if he should experience any worsening edema or ulcer formation The patient and his wife were present expressed their understanding.  2. Hypercholesterolemia -  Stable Encouraged good control as its slows the progression of atherosclerotic disease  3. Type 2 diabetes mellitus with diabetic peripheral angiopathy without gangrene, without long-term current use of insulin (HCC) - Stable Encouraged good control as its slows the progression of atherosclerotic disease  Current Outpatient Medications on File Prior to Visit  Medication Sig Dispense Refill  . ACCU-CHEK AVIVA PLUS test strip USE AS INSTRUCTED. USE 1 STRIP EVERY 3 HOURS . ACCU-CHECK AVIVA PLUS TEST STRIP. DX E10.649  12  . acyclovir (ZOVIRAX) 400 MG tablet Take 1 tablet (400 mg total) by mouth daily. 30 tablet 3  . amoxicillin-clavulanate (AUGMENTIN) 875-125 MG tablet Take 1 tablet by mouth every 12 (twelve) hours. 14 tablet 0  . carvedilol (COREG) 3.125 MG tablet Take  3.125 mg by mouth 2 (two) times daily with a meal.     . ferrous sulfate 325 (65 FE) MG tablet Take 325 mg by mouth daily.    . furosemide (LASIX) 20 MG tablet Take 1 tablet (20 mg total) by mouth daily. 30 tablet 1  . GLUCAGON EMERGENCY 1 MG injection     . glucose 4 GM chewable tablet Chew 1 tablet by mouth once as needed for low blood sugar.    . insulin aspart (NOVOLOG) 100 UNIT/ML injection Basal rate 12am 0.8 units per hour, 9am 0.9 units per hour. (total basal insulin 20.7 units). Carbohydrate ratio 1 units for every 8gm of carbohydrate. Correction factor 1 units for every 50mg /dl over target cbg. Target CBG 80-120 1 vial 12  . levothyroxine (SYNTHROID, LEVOTHROID) 25 MCG tablet Take 25 mcg by mouth daily.    Marland Kitchen lidocaine-prilocaine (EMLA) cream Apply 1 application topically as needed. Apply to port prior to chemotherapy appointment, cover with plastic wrap. 30 g 2  . lisinopril (PRINIVIL,ZESTRIL) 20 MG tablet Take 20 mg by mouth daily.    . Multiple Vitamin (MULTIVITAMIN) tablet Take 1 tablet by mouth daily.    . naproxen sodium (ALEVE) 220 MG tablet Take 220 mg by mouth daily as needed.    . ondansetron (ZOFRAN) 8 MG tablet  Take 1 tablet (8 mg total) by mouth 2 (two) times daily as needed for refractory nausea / vomiting. 30 tablet 2  . pantoprazole (PROTONIX) 40 MG tablet TAKE 1 TABLET (40 MG TOTAL) BY MOUTH DAILY. 30 tablet 0  . pravastatin (PRAVACHOL) 40 MG tablet Take 1 tablet (40 mg total) by mouth at bedtime. Will need office visit for any more refills 30 tablet 0  . prochlorperazine (COMPAZINE) 10 MG tablet Take 1 tablet (10 mg total) by mouth every 6 (six) hours as needed (Nausea or vomiting). 60 tablet 2  . spironolactone (ALDACTONE) 25 MG tablet Take 25 mg by mouth daily.     No current facility-administered medications on file prior to visit.    There are no Patient Instructions on file for this visit. No Follow-up on file.  Eula Mazzola A Loye Reininger, PA-C

## 2017-12-16 ENCOUNTER — Encounter: Payer: Self-pay | Admitting: *Deleted

## 2017-12-16 ENCOUNTER — Encounter: Payer: Self-pay | Admitting: Internal Medicine

## 2017-12-16 ENCOUNTER — Ambulatory Visit (INDEPENDENT_AMBULATORY_CARE_PROVIDER_SITE_OTHER): Payer: Medicare Other | Admitting: Internal Medicine

## 2017-12-16 DIAGNOSIS — E1151 Type 2 diabetes mellitus with diabetic peripheral angiopathy without gangrene: Secondary | ICD-10-CM | POA: Diagnosis not present

## 2017-12-16 DIAGNOSIS — I890 Lymphedema, not elsewhere classified: Secondary | ICD-10-CM

## 2017-12-16 DIAGNOSIS — C8318 Mantle cell lymphoma, lymph nodes of multiple sites: Secondary | ICD-10-CM

## 2017-12-16 DIAGNOSIS — I2510 Atherosclerotic heart disease of native coronary artery without angina pectoris: Secondary | ICD-10-CM | POA: Diagnosis not present

## 2017-12-16 DIAGNOSIS — I10 Essential (primary) hypertension: Secondary | ICD-10-CM

## 2017-12-16 DIAGNOSIS — I5022 Chronic systolic (congestive) heart failure: Secondary | ICD-10-CM

## 2017-12-16 DIAGNOSIS — R001 Bradycardia, unspecified: Secondary | ICD-10-CM

## 2017-12-16 NOTE — Progress Notes (Signed)
Pre-visit discussion using our clinic review tool. No additional management support is needed unless otherwise documented below in the visit note.  

## 2017-12-16 NOTE — Progress Notes (Signed)
[name], male   DOB: Jun 12, 1946, 72 y.o.   MRN: [id number]   Subjective:    [name], male    DOB: May 23, 1946, 72 y.o.   MRN: [id number]  HPI  Patient here for hospital follow up.  He was admitted 12/02/17 with syncope and found to be bradycardic.  Is s/p pacemaker placement.  Suffered left foot laceration and had 4 sutures placed.  Has four sutures between left 4th and 5th toe.  Was also evaluated by wound care and unna boots continued and was placed on IV abx initially and discharged on augmentin.  He is taking the augmentin daily.  Was having some loose stool, but this has stopped.  Discussed importance of taking the abx as prescribed.  Also discussed taking probiotics.  He reports he is eating.  Was seen at [facility] yesterday.  Was instructed to wear complression  Hose.  Put them on yesterday and has not taken off since they wre put on.  No low sugars since his last visit.     Past Medical History:  Diagnosis Date  . Depression   . 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 Gastroparesis   . Graves disease   . Hypercholesterolemia   . Hypertension   . Mantle cell lymphoma ([place])   . Peripheral neuropathy    Past Surgical History:  Procedure Laterality Date  . CHOLECYSTECTOMY    . NEPHRECTOMY     right  . PACEMAKER INSERTION N/A 12/07/2017   Procedure: INSERTION PACEMAKER DUAL CHAMBER INITIAL INSERT;  Surgeon: Isaias Cowman, MD;  Location: [facility];  Service: Cardiovascular;  Laterality: N/A;  . PORTA CATH INSERTION N/A 11/03/2017   Procedure: PORTA CATH INSERTION;  Surgeon: Katha Cabal, MD;  Location: [place] [facility];  Service: Cardiovascular;  Laterality: N/A;   Family History  Problem Relation Age of Onset  . Breast cancer Mother   . Diabetes Father   . Cancer Father        Lymphoma  . Alcohol abuse Maternal Uncle   . Diabetes Sister   . Heart disease  Unknown        grandfather   Social History   Socioeconomic History  . Marital status: Married    Spouse name: None  . Number of children: 1  . Years of education: None  . Highest education level: None  Social Needs  . Financial resource strain: None  . Food insecurity - worry: None  . Food insecurity - inability: None  . Transportation needs - medical: None  . Transportation needs - non-medical: None  Occupational History  . None  Tobacco Use  . Smoking status: Former Research scientist (life sciences)  . Smokeless tobacco: Current User    Types: Chew  Substance and Sexual Activity  . Alcohol use: Yes    Alcohol/week: 1.8 oz    Types: 3 Cans of beer per week    Comment: nightly  . Drug use: No  . Sexual activity: Not Currently  Other Topics Concern  . None  Social History Narrative  . None    Outpatient Encounter Medications as of 12/16/2017  Medication Sig  . ACCU-CHEK AVIVA PLUS test strip USE AS INSTRUCTED. USE 1 STRIP EVERY 3 HOURS . ACCU-CHECK AVIVA PLUS TEST STRIP. DX E99.371  . acyclovir (ZOVIRAX) 400 MG tablet Take 1 tablet (400 mg total) by mouth daily.  Marland Kitchen amoxicillin-clavulanate (AUGMENTIN) 875-125 MG tablet Take  1 tablet by mouth every 12 (twelve) hours.  . carvedilol (COREG) 3.125 MG tablet Take 3.125 mg by mouth 2 (two) times daily with a meal.   . ferrous sulfate 325 (65 FE) MG tablet Take 325 mg by mouth daily.  . furosemide (LASIX) 20 MG tablet Take 1 tablet (20 mg total) by mouth daily.  Marland Kitchen GLUCAGON EMERGENCY 1 MG injection   . glucose 4 GM chewable tablet Chew 1 tablet by mouth once as needed for low blood sugar.  . insulin aspart (NOVOLOG) 100 UNIT/ML injection Basal rate 12am 0.8 units per hour, 9am 0.9 units per hour. (total basal insulin 20.7 units). Carbohydrate ratio 1 units for every 8gm of carbohydrate. Correction factor 1 units for every 50mg /dl over target cbg. Target CBG 80-120  . levothyroxine (SYNTHROID, LEVOTHROID) 25 MCG tablet Take 25 mcg by mouth daily.  Marland Kitchen  lidocaine-prilocaine (EMLA) cream Apply 1 application topically as needed. Apply to port prior to chemotherapy appointment, cover with plastic wrap.  . lisinopril (PRINIVIL,ZESTRIL) 20 MG tablet Take 20 mg by mouth daily.  . Multiple Vitamin (MULTIVITAMIN) tablet Take 1 tablet by mouth daily.  . naproxen sodium (ALEVE) 220 MG tablet Take 220 mg by mouth daily as needed.  . ondansetron (ZOFRAN) 8 MG tablet Take 1 tablet (8 mg total) by mouth 2 (two) times daily as needed for refractory nausea / vomiting.  . pantoprazole (PROTONIX) 40 MG tablet TAKE 1 TABLET (40 MG TOTAL) BY MOUTH DAILY.  . pravastatin (PRAVACHOL) 40 MG tablet Take 1 tablet (40 mg total) by mouth at bedtime. Will need office visit for any more refills  . prochlorperazine (COMPAZINE) 10 MG tablet Take 1 tablet (10 mg total) by mouth every 6 (six) hours as needed (Nausea or vomiting).  Marland Kitchen spironolactone (ALDACTONE) 25 MG tablet Take 25 mg by mouth daily.   No facility-administered encounter medications on file as of 12/16/2017.     Review of Systems  Constitutional: Negative for appetite change and unexpected weight change.  HENT: Negative for congestion and sinus pressure.   Respiratory: Negative for cough and chest tightness.        Breathing stable.    Cardiovascular: Positive for leg swelling. Negative for chest pain.  Gastrointestinal: Positive for diarrhea. Negative for abdominal pain and nausea.       Had been having problems with diarrhea.  Better now.    Genitourinary: Negative for difficulty urinating and dysuria.  Musculoskeletal: Negative for joint swelling and myalgias.  Skin: Negative for rash.       Stasis changes noted - lower extremity.  Bullous lesions - multiple - lower extremities.    Neurological: Negative for dizziness, light-headedness and headaches.  Psychiatric/Behavioral: Negative for agitation and dysphoric mood.      Objective:    Physical Exam  Constitutional: He appears well-developed and  well-nourished. No distress.  HENT:  Nose: Nose normal.  Mouth/Throat: Oropharynx is clear and moist.  Neck: Neck supple.  Cardiovascular: Normal rate and regular rhythm.  Pulmonary/Chest: Effort normal and breath sounds normal. No respiratory distress.  Abdominal: Soft. Bowel sounds are normal. There is no tenderness.  Musculoskeletal:  Increased pedal and lower extremity edema.  Increased erythema.  4 sutures embedded in fifth toe.  Some minimal pain.  Sutures removed.  Bullous lesions noted lower extremities.  One lesion opened and drained when compression hose removed.    Lymphadenopathy:    He has no cervical adenopathy.  Psychiatric: He has a normal mood and affect. His behavior  is normal.    BP 132/62 (BP Location: Left Arm, Patient Position: Sitting, Cuff Size: Normal)   Pulse 62   Resp 16   Ht 5\' 10"  (1.778 m)   Wt 253 lb (114.8 kg)   BMI 36.30 kg/m  Wt Readings from Last 3 Encounters:  12/16/17 253 lb (114.8 kg)  12/15/17 250 lb (113.4 kg)  12/03/17 264 lb 14.4 oz (120.2 kg)     Lab Results  Component Value Date   WBC 3.7 (L) 12/09/2017   HGB 10.9 (L) 12/09/2017   HCT 33.0 (L) 12/09/2017   PLT 101 (L) 12/09/2017   GLUCOSE 117 (H) 12/09/2017   CHOL 197 08/09/2015   TRIG 51.0 08/09/2015   HDL 73.60 08/09/2015   LDLCALC 113 (H) 08/09/2015   ALT 21 11/16/2017   AST 32 11/16/2017   NA 141 12/09/2017   K 4.2 12/09/2017   CL 108 12/09/2017   CREATININE 0.81 12/09/2017   BUN 14 12/09/2017   CO2 27 12/09/2017   TSH 3.894 10/28/2017   INR 1.22 12/02/2017   HGBA1C 5.6 10/28/2017   MICROALBUR 1.9 09/22/2016       Assessment & Plan:   Problem List Items Addressed This Visit    Bradycardia    S/p syncopal episode.  Recently admitted.  S/p pacemaker placement.        Chronic systolic CHF (congestive heart failure) (HCC)    Breathing overall stable.  Persistent lower extremity swelling - improved from previous checks.  Continue current medication regiment.   Scheduled follow up with cardiology.       Diabetes ([place])    No low sugars.  Continue f/u with Dr Eddie Dibbles.       Essential hypertension, benign    Blood pressure under good control.  Continue same medication regimen.  Follow pressures.  Follow metabolic panel.        Lymphedema    Seeing [facility].  Has pump at home.  With some increased edema.  Compression hose removed.  Bullous lesions noted - appears to be where hose were not completely stretched out.  Given increased edema, I do feel he needs f/u with [facility].  Call to get appt.        Mantle cell lymphoma (HCC)    Progression.  Followed by oncology.            [name], MD

## 2017-12-16 NOTE — Patient Instructions (Addendum)
Examples of probiotics:  Florastor, culturelle or align  Take daily while you are on the antibiotics and for two weeks after completing the antibiotics.    Appointment 12/20/17 - 9:15

## 2017-12-17 ENCOUNTER — Ambulatory Visit (INDEPENDENT_AMBULATORY_CARE_PROVIDER_SITE_OTHER): Payer: Medicare Other | Admitting: Vascular Surgery

## 2017-12-17 ENCOUNTER — Telehealth: Payer: Self-pay | Admitting: Internal Medicine

## 2017-12-17 ENCOUNTER — Encounter (INDEPENDENT_AMBULATORY_CARE_PROVIDER_SITE_OTHER): Payer: Self-pay | Admitting: Vascular Surgery

## 2017-12-17 VITALS — BP 137/73 | HR 70 | Resp 17 | Ht 70.0 in | Wt 253.0 lb

## 2017-12-17 DIAGNOSIS — I5022 Chronic systolic (congestive) heart failure: Secondary | ICD-10-CM

## 2017-12-17 DIAGNOSIS — I890 Lymphedema, not elsewhere classified: Secondary | ICD-10-CM | POA: Diagnosis not present

## 2017-12-17 DIAGNOSIS — I10 Essential (primary) hypertension: Secondary | ICD-10-CM | POA: Diagnosis not present

## 2017-12-17 DIAGNOSIS — I2510 Atherosclerotic heart disease of native coronary artery without angina pectoris: Secondary | ICD-10-CM

## 2017-12-17 NOTE — Assessment & Plan Note (Signed)
S/p syncopal episode.  Recently admitted.  S/p pacemaker placement.

## 2017-12-17 NOTE — Assessment & Plan Note (Signed)
Seeing [facility].  Has pump at home.  With some increased edema.  Compression hose removed.  Bullous lesions noted - appears to be where hose were not completely stretched out.  Given increased edema, I do feel he needs f/u with [facility].  Call to get appt.

## 2017-12-17 NOTE — Assessment & Plan Note (Signed)
blood pressure control important in reducing the progression of atherosclerotic disease. On appropriate oral medications.  

## 2017-12-17 NOTE — Assessment & Plan Note (Signed)
His swelling has markedly worsened and he has skin breakdown.  We will go back into Unna boots.  These will need to change weekly.  Recheck in 4 weeks

## 2017-12-17 NOTE — Assessment & Plan Note (Signed)
No low sugars.  Continue f/u with Dr Eddie Dibbles.

## 2017-12-17 NOTE — Progress Notes (Signed)
MRN : [id number]  Gary [name] is a 72 y.o. (Nov 27, 1945) male who presents with chief complaint of  Chief Complaint  Patient presents with  . Follow-up    Blisters on leg  .  History of Present Illness: Patient returns today in follow up of leg swelling and ulceration.  He just came out of the [place] boots earlier this week, but within 48 hours developed weeping and marked swelling in both lower extremities.  This is despite the use of compression stockings.  No fevers or chills.  Current Outpatient Medications  Medication Sig Dispense Refill  . ACCU-CHEK AVIVA PLUS test strip USE AS INSTRUCTED. USE 1 STRIP EVERY 3 HOURS . ACCU-CHECK AVIVA PLUS TEST STRIP. DX E10.649  12  . acyclovir (ZOVIRAX) 400 MG tablet Take 1 tablet (400 mg total) by mouth daily. 30 tablet 3  . amoxicillin-clavulanate (AUGMENTIN) 875-125 MG tablet Take 1 tablet by mouth every 12 (twelve) hours. 14 tablet 0  . carvedilol (COREG) 3.125 MG tablet Take 3.125 mg by mouth 2 (two) times daily with a meal.     . ferrous sulfate 325 (65 FE) MG tablet Take 325 mg by mouth daily.    . furosemide (LASIX) 20 MG tablet Take 1 tablet (20 mg total) by mouth daily. 30 tablet 1  . GLUCAGON EMERGENCY 1 MG injection     . glucose 4 GM chewable tablet Chew 1 tablet by mouth once as needed for low blood sugar.    . insulin aspart (NOVOLOG) 100 UNIT/ML injection Basal rate 12am 0.8 units per hour, 9am 0.9 units per hour. (total basal insulin 20.7 units). Carbohydrate ratio 1 units for every 8gm of carbohydrate. Correction factor 1 units for every 42m/dl over target cbg. Target CBG 80-120 1 vial 12  . levothyroxine (SYNTHROID, LEVOTHROID) 25 MCG tablet Take 25 mcg by mouth daily.    .Marland Kitchenlidocaine-prilocaine (EMLA) cream Apply 1 application topically as needed. Apply to port prior to chemotherapy appointment, cover with plastic wrap. 30 g 2  . lisinopril (PRINIVIL,ZESTRIL) 20 MG tablet Take 20 mg by mouth daily.    . Multiple Vitamin  (MULTIVITAMIN) tablet Take 1 tablet by mouth daily.    . naproxen sodium (ALEVE) 220 MG tablet Take 220 mg by mouth daily as needed.    . ondansetron (ZOFRAN) 8 MG tablet Take 1 tablet (8 mg total) by mouth 2 (two) times daily as needed for refractory nausea / vomiting. 30 tablet 2  . pantoprazole (PROTONIX) 40 MG tablet TAKE 1 TABLET (40 MG TOTAL) BY MOUTH DAILY. 30 tablet 0  . pravastatin (PRAVACHOL) 40 MG tablet Take 1 tablet (40 mg total) by mouth at bedtime. Will need office visit for any more refills 30 tablet 0  . prochlorperazine (COMPAZINE) 10 MG tablet Take 1 tablet (10 mg total) by mouth every 6 (six) hours as needed (Nausea or vomiting). 60 tablet 2  . spironolactone (ALDACTONE) 25 MG tablet Take 25 mg by mouth daily.     No current facility-administered medications for this visit.     Past Medical History:  Diagnosis Date  . Depression   . 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 Gastroparesis   . Graves disease   . Hypercholesterolemia   . Hypertension   . Mantle cell lymphoma ([place]   . Peripheral neuropathy     Past Surgical History:  Procedure Laterality Date  . CHOLECYSTECTOMY    . NEPHRECTOMY  right  . PACEMAKER INSERTION N/A 12/07/2017   Procedure: INSERTION PACEMAKER DUAL CHAMBER INITIAL INSERT;  Surgeon: Isaias Cowman, MD;  Location: [facility];  Service: Cardiovascular;  Laterality: N/A;  . PORTA CATH INSERTION N/A 11/03/2017   Procedure: PORTA CATH INSERTION;  Surgeon: Katha Cabal, MD;  Location: [place] [facility];  Service: Cardiovascular;  Laterality: N/A;    Social History Social History   Tobacco Use  . Smoking status: Former Research scientist (life sciences)  . Smokeless tobacco: Current User    Types: Chew  Substance Use Topics  . Alcohol use: Yes    Alcohol/week: 1.8 oz    Types: 3 Cans of beer per week    Comment: nightly  . Drug use: No     Family History Family History  Problem Relation Age  of Onset  . Breast cancer Mother   . Diabetes Father   . Cancer Father        Lymphoma  . Alcohol abuse Maternal Uncle   . Diabetes Sister   . Heart disease Unknown        grandfather     Allergies  Allergen Reactions  . Rofecoxib Nausea Only     REVIEW OF SYSTEMS (Negative unless checked)  Constitutional: _0 Weight loss  _1 Fever  _2 Chills Cardiac: _3 Chest pain   _4 Chest pressure   _5 Palpitations   _6 Shortness of breath when laying flat   _7 Shortness of breath at rest   _8 Shortness of breath with exertion. Vascular:  _9 Pain in legs with walking   _10 Pain in legs at rest   _11 Pain in legs when laying flat   _12 Claudication   _13 Pain in feet when walking  _14 Pain in feet at rest  _15 Pain in feet when laying flat   _16 History of DVT   _17 Phlebitis   _18 Swelling in legs   _19 Varicose veins   _20 Non-healing ulcers Pulmonary:   _21 Uses home oxygen   _22 Productive cough   _23 Hemoptysis   _24 Wheeze  _25 COPD   _26 Asthma Neurologic:  _27 Dizziness  _28 Blackouts   _29 Seizures   _30 History of stroke   _31 History of TIA  _32 Aphasia   _33 Temporary blindness   _34 Dysphagia   _35 Weakness or numbness in arms   _36 Weakness or numbness in legs Musculoskeletal:  _37 Arthritis   _38 Joint swelling   _39 Joint pain   _40 Low back pain Hematologic:  _41 Easy bruising  _42 Easy bleeding   _43 Hypercoagulable state   _44 Anemic   Gastrointestinal:  _45 Blood in stool   _46 Vomiting blood  _47 Gastroesophageal reflux/heartburn   _48 Abdominal pain Genitourinary:  _49 Chronic kidney disease   _50 Difficult urination  _51 Frequent urination  _52 Burning with urination   _53 Hematuria Skin:  _54 Rashes   _55 Ulcers   _56 Wounds Psychological:  _57 History of anxiety   _58  History of major depression.  Physical Examination  BP 137/73 (BP Location: Right Arm, Patient Position: Sitting)   Pulse 70   Resp 17   Ht _59  (1.778 m)   Wt 253 lb (114.8 kg)   BMI 36.30 kg/m  Gen:  WD/WN, NAD.  Disheveled  head: [place]/AT, No temporalis wasting. Ear/Nose/Throat: Hearing grossly  intact, nares w/o erythema or drainage, trachea midline Eyes: Conjunctiva clear. Sclera non-icteric Neck: Supple.  No JVD.  Pulmonary:  Good air movement, no use of accessory muscles.  Cardiac: RRR, normal S1, S2 Vascular:  Vessel Right Left  Radial Palpable Palpable  Musculoskeletal: M/S 5/5 throughout.  No deformity or atrophy. 2+ BLE edema.  Listers and weeping throughout both lower extremities particularly on the posterior aspect Neurologic: Sensation grossly intact in extremities.  Symmetrical.  Speech is fluent.  Psychiatric: Judgment intact, Mood & affect appropriate for pt's clinical situation. Dermatologic: Superficial calf wounds bilaterally      Labs Recent Results (from the past 2160 hour(s))  I-STAT creatinine     Status: None   Collection Time: 10/19/17 10:23 AM  Result Value Ref Range   Creatinine, Ser 1.00 0.61 - 1.24 mg/dL  Comprehensive metabolic panel     Status: Abnormal   Collection Time: 10/21/17 12:43 PM  Result Value Ref Range   Sodium 139 135 - 145 mmol/L   Potassium 4.2 3.5 - 5.1 mmol/L   Chloride 105 101 - 111 mmol/L   CO2 27 22 - 32 mmol/L   Glucose, Bld 79 65 - 99 mg/dL   BUN 16 6 - 20 mg/dL   Creatinine, Ser 0.80 0.61 - 1.24 mg/dL   Calcium 8.2 (L) 8.9 - 10.3 mg/dL   Total Protein 6.9 6.5 - 8.1 g/dL   Albumin 3.3 (L) 3.5 - 5.0 g/dL   AST 30 15 - 41 U/L   ALT 19 17 - 63 U/L   Alkaline Phosphatase 247 (H) 38 - 126 U/L   Total Bilirubin 0.9 0.3 - 1.2 mg/dL   GFR calc non Af Amer >60 >60 mL/min   GFR calc Af Amer >60 >60 mL/min    Comment: (NOTE) The eGFR has been calculated using the CKD EPI equation. This calculation has not been validated in all clinical situations. eGFR's persistently <60 mL/min signify possible Chronic Kidney Disease.    Anion gap 7 5 - 15    Comment: Performed at [facility], [address] [place], [place] [address]  CBC with Differential     Status: Abnormal    Collection Time: 10/21/17 12:43 PM  Result Value Ref Range   WBC 5.4 3.8 - 10.6 K/uL   RBC 4.37 (L) 4.40 - 5.90 MIL/uL   Hemoglobin 12.0 (L) 13.0 - 18.0 g/dL   HCT 37.8 (L) 40.0 - 52.0 %   MCV 86.6 80.0 - 100.0 fL   MCH 27.5 26.0 - 34.0 pg   MCHC 31.8 (L) 32.0 - 36.0 g/dL   RDW 23.5 (H) 11.5 - 14.5 %   Platelets 174 150 - 440 K/uL   Neutrophils Relative % 70 %   Neutro Abs 3.8 1.4 - 6.5 K/uL   Lymphocytes Relative 15 %   Lymphs Abs 0.8 (L) 1.0 - 3.6 K/uL   Monocytes Relative 10 %   Monocytes Absolute 0.5 0.2 - 1.0 K/uL   Eosinophils Relative 4 %   Eosinophils Absolute 0.2 0 - 0.7 K/uL   Basophils Relative 1 %   Basophils Absolute 0.1 0 - 0.1 K/uL    Comment: Performed at [facility], [address] [place], [place] [address]  Lactate dehydrogenase     Status: None   Collection Time: 10/21/17 12:43 PM  Result Value Ref Range   LDH 170 98 - 192 U/L    Comment: Performed at [facility], [address] [place], [name] [address]  Hepatitis C antibody screen     Status: None   Collection Time: 10/22/17 11:28 AM  Result Value Ref Range   Hepatitis C Ab NON-REACTIVE NON-REACTI   SIGNAL TO CUT-OFF 0.06 <1.00  Glucose, capillary     Status:  Abnormal   Collection Time: 10/28/17 11:53 AM  Result Value Ref Range   Glucose-Capillary 42 (LL) 65 - 99 mg/dL   Comment 1 Notify RN   CBC with Differential     Status: Abnormal   Collection Time: 10/28/17 11:58 AM  Result Value Ref Range   WBC 6.3 3.8 - 10.6 K/uL   RBC 4.48 4.40 - 5.90 MIL/uL   Hemoglobin 12.6 (L) 13.0 - 18.0 g/dL   HCT 39.0 (L) 40.0 - 52.0 %   MCV 87.0 80.0 - 100.0 fL   MCH 28.2 26.0 - 34.0 pg   MCHC 32.4 32.0 - 36.0 g/dL   RDW 22.0 (H) 11.5 - 14.5 %   Platelets 167 150 - 440 K/uL   Neutrophils Relative % 74 %   Neutro Abs 4.6 1.4 - 6.5 K/uL   Lymphocytes Relative 10 %   Lymphs Abs 0.6 (L) 1.0 - 3.6 K/uL   Monocytes Relative 11 %   Monocytes Absolute 0.7 0.2 - 1.0 K/uL   Eosinophils Relative 4 %     Eosinophils Absolute 0.3 0 - 0.7 K/uL   Basophils Relative 1 %   Basophils Absolute 0.1 0 - 0.1 K/uL    Comment: Performed at [facility], [address] [place], [place] [address]  Comprehensive metabolic panel     Status: Abnormal   Collection Time: 10/28/17 11:58 AM  Result Value Ref Range   Sodium 141 135 - 145 mmol/L   Potassium 3.9 3.5 - 5.1 mmol/L   Chloride 104 101 - 111 mmol/L   CO2 28 22 - 32 mmol/L   Glucose, Bld 56 (L) 65 - 99 mg/dL   BUN 14 6 - 20 mg/dL   Creatinine, Ser 1.03 0.61 - 1.24 mg/dL   Calcium 8.4 (L) 8.9 - 10.3 mg/dL   Total Protein 6.5 6.5 - 8.1 g/dL   Albumin 3.2 (L) 3.5 - 5.0 g/dL   AST 32 15 - 41 U/L   ALT 16 (L) 17 - 63 U/L   Alkaline Phosphatase 230 (H) 38 - 126 U/L   Total Bilirubin 1.0 0.3 - 1.2 mg/dL   GFR calc non Af Amer >60 >60 mL/min   GFR calc Af Amer >60 >60 mL/min    Comment: (NOTE) The eGFR has been calculated using the CKD EPI equation. This calculation has not been validated in all clinical situations. eGFR's persistently <60 mL/min signify possible Chronic Kidney Disease.    Anion gap 9 5 - 15    Comment: Performed at [facility], [address] [place], [place] [address]  TSH     Status: None   Collection Time: 10/28/17 11:58 AM  Result Value Ref Range   TSH 3.894 0.350 - 4.500 uIU/mL    Comment: Performed by a 3rd Generation assay with a functional sensitivity of <=0.01 uIU/mL. Performed at [facility], [address] [place], [place] [address]   Hemoglobin A1c     Status: None   Collection Time: 10/28/17 11:58 AM  Result Value Ref Range   Hgb A1c MFr Bld 5.6 4.8 - 5.6 %    Comment: (NOTE) Pre diabetes:          5.7%-6.4% Diabetes:              >6.4% Glycemic control for   <7.0% adults with diabetes    Mean Plasma Glucose 114.02 mg/dL    Comment: Performed at [facility] [address], [place], [place] [address]  Glucose, capillary     Status: None   Collection Time:  10/28/17 12:16 PM  Result Value Ref Range   Glucose-Capillary 71 65 - 99 mg/dL  Glucose, capillary     Status: Abnormal   Collection Time: 10/28/17 12:52 PM  Result Value Ref Range   Glucose-Capillary 44 (LL) 65 - 99 mg/dL  Glucose, capillary     Status: Abnormal   Collection Time: 10/28/17  1:54 PM  Result Value Ref Range   Glucose-Capillary 105 (H) 65 - 99 mg/dL  Glucose, capillary     Status: Abnormal   Collection Time: 10/28/17  3:09 PM  Result Value Ref Range   Glucose-Capillary 170 (H) 65 - 99 mg/dL  Glucose, capillary     Status: Abnormal   Collection Time: 10/28/17  4:51 PM  Result Value Ref Range   Glucose-Capillary 217 (H) 65 - 99 mg/dL  Glucose, capillary     Status: Abnormal   Collection Time: 10/28/17  5:49 PM  Result Value Ref Range   Glucose-Capillary 264 (H) 65 - 99 mg/dL  Urinalysis, Complete w Microscopic     Status: Abnormal   Collection Time: 10/28/17  8:01 PM  Result Value Ref Range   Color, Urine YELLOW (A) YELLOW   APPearance CLEAR (A) CLEAR   Specific Gravity, Urine 1.025 1.005 - 1.030   pH 5.0 5.0 - 8.0   Glucose, UA >=500 (A) NEGATIVE mg/dL   Hgb urine dipstick NEGATIVE NEGATIVE   Bilirubin Urine NEGATIVE NEGATIVE   Ketones, ur 5 (A) NEGATIVE mg/dL   Protein, ur NEGATIVE NEGATIVE mg/dL   Nitrite NEGATIVE NEGATIVE   Leukocytes, UA NEGATIVE NEGATIVE   RBC / HPF 0-5 0 - 5 RBC/hpf   WBC, UA 0-5 0 - 5 WBC/hpf   Bacteria, UA NONE SEEN NONE SEEN   Squamous Epithelial / LPF 0-5 (A) NONE SEEN   Mucus PRESENT    Hyaline Casts, UA PRESENT     Comment: Performed at [facility], [address] [place], [place] [address]  Glucose, capillary     Status: Abnormal   Collection Time: 10/28/17  9:02 PM  Result Value Ref Range   Glucose-Capillary 402 (H) 65 - 99 mg/dL  Glucose, capillary     Status: Abnormal   Collection Time: 10/29/17 12:20 AM  Result Value Ref Range   Glucose-Capillary 339 (H) 65 - 99 mg/dL  Glucose, capillary     Status:  Abnormal   Collection Time: 10/29/17  5:54 AM  Result Value Ref Range   Glucose-Capillary 376 (H) 65 - 99 mg/dL  CBC     Status: Abnormal   Collection Time: 10/29/17  6:10 AM  Result Value Ref Range   WBC 5.4 3.8 - 10.6 K/uL   RBC 4.21 (L) 4.40 - 5.90 MIL/uL   Hemoglobin 11.9 (L) 13.0 - 18.0 g/dL   HCT 37.4 (L) 40.0 - 52.0 %   MCV 88.7 80.0 - 100.0 fL   MCH 28.3 26.0 - 34.0 pg   MCHC 31.9 (L) 32.0 - 36.0 g/dL   RDW 21.9 (H) 11.5 - 14.5 %   Platelets 147 (L) 150 - 440 K/uL    Comment: Performed at [facility], [address] [place], [place] [address]  Basic metabolic panel     Status: Abnormal   Collection Time: 10/29/17  6:10 AM  Result Value Ref Range   Sodium 137 135 - 145 mmol/L   Potassium 4.9 3.5 - 5.1 mmol/L   Chloride 102 101 -  111 mmol/L   CO2 22 22 - 32 mmol/L   Glucose, Bld 394 (H) 65 - 99 mg/dL   BUN 18 6 - 20 mg/dL   Creatinine, Ser 1.15 0.61 - 1.24 mg/dL   Calcium 8.1 (L) 8.9 - 10.3 mg/dL   GFR calc non Af Amer >60 >60 mL/min   GFR calc Af Amer >60 >60 mL/min    Comment: (NOTE) The eGFR has been calculated using the CKD EPI equation. This calculation has not been validated in all clinical situations. eGFR's persistently <60 mL/min signify possible Chronic Kidney Disease.    Anion gap 13 5 - 15    Comment: Performed at [facility], [address] [place], [place] [address]  Glucose, capillary     Status: Abnormal   Collection Time: 10/29/17  7:24 AM  Result Value Ref Range   Glucose-Capillary 388 (H) 65 - 99 mg/dL  Glucose, capillary     Status: Abnormal   Collection Time: 10/29/17 12:09 PM  Result Value Ref Range   Glucose-Capillary 299 (H) 65 - 99 mg/dL  Comprehensive metabolic panel     Status: Abnormal   Collection Time: 11/02/17  8:22 AM  Result Value Ref Range   Sodium 139 135 - 145 mmol/L   Potassium 4.0 3.5 - 5.1 mmol/L   Chloride 100 (L) 101 - 111 mmol/L   CO2 31 22 - 32 mmol/L   Glucose, Bld 70 65 - 99 mg/dL   BUN 14  6 - 20 mg/dL   Creatinine, Ser 0.95 0.61 - 1.24 mg/dL   Calcium 8.4 (L) 8.9 - 10.3 mg/dL   Total Protein 6.9 6.5 - 8.1 g/dL   Albumin 3.3 (L) 3.5 - 5.0 g/dL   AST 32 15 - 41 U/L   ALT 18 17 - 63 U/L   Alkaline Phosphatase 196 (H) 38 - 126 U/L   Total Bilirubin 0.9 0.3 - 1.2 mg/dL   GFR calc non Af Amer >60 >60 mL/min   GFR calc Af Amer >60 >60 mL/min    Comment: (NOTE) The eGFR has been calculated using the CKD EPI equation. This calculation has not been validated in all clinical situations. eGFR's persistently <60 mL/min signify possible Chronic Kidney Disease.    Anion gap 8 5 - 15    Comment: Performed at [facility], [address] [place], [place] [address]  CBC with Differential     Status: Abnormal   Collection Time: 11/02/17  8:22 AM  Result Value Ref Range   WBC 6.7 3.8 - 10.6 K/uL   RBC 4.38 (L) 4.40 - 5.90 MIL/uL   Hemoglobin 12.4 (L) 13.0 - 18.0 g/dL   HCT 38.4 (L) 40.0 - 52.0 %   MCV 87.7 80.0 - 100.0 fL   MCH 28.4 26.0 - 34.0 pg   MCHC 32.3 32.0 - 36.0 g/dL   RDW 20.6 (H) 11.5 - 14.5 %   Platelets 154 150 - 440 K/uL   Neutrophils Relative % 70 %   Neutro Abs 4.7 1.4 - 6.5 K/uL   Lymphocytes Relative 12 %   Lymphs Abs 0.8 (L) 1.0 - 3.6 K/uL   Monocytes Relative 12 %   Monocytes Absolute 0.8 0.2 - 1.0 K/uL   Eosinophils Relative 5 %   Eosinophils Absolute 0.3 0 - 0.7 K/uL   Basophils Relative 1 %   Basophils Absolute 0.0 0 - 0.1 K/uL    Comment: Performed at [facility], [address] [place], [place] [address]  Glucose, capillary     Status: Abnormal   Collection Time: 11/03/17 12:32 PM  Result Value Ref Range   Glucose-Capillary 58 (L) 65 - 99 mg/dL  Glucose, capillary     Status: None   Collection Time: 11/03/17  1:16 PM  Result Value Ref Range   Glucose-Capillary 77 65 - 99 mg/dL  Comprehensive metabolic panel     Status: Abnormal   Collection Time: 11/09/17  9:29 AM  Result Value Ref Range   Sodium 140 135 - 145 mmol/L    Potassium 3.8 3.5 - 5.1 mmol/L   Chloride 104 101 - 111 mmol/L   CO2 26 22 - 32 mmol/L   Glucose, Bld 88 65 - 99 mg/dL   BUN 15 6 - 20 mg/dL   Creatinine, Ser 0.94 0.61 - 1.24 mg/dL   Calcium 7.9 (L) 8.9 - 10.3 mg/dL   Total Protein 6.2 (L) 6.5 - 8.1 g/dL   Albumin 3.1 (L) 3.5 - 5.0 g/dL   AST 26 15 - 41 U/L   ALT 14 (L) 17 - 63 U/L   Alkaline Phosphatase 188 (H) 38 - 126 U/L   Total Bilirubin 0.7 0.3 - 1.2 mg/dL   GFR calc non Af Amer >60 >60 mL/min   GFR calc Af Amer >60 >60 mL/min    Comment: (NOTE) The eGFR has been calculated using the CKD EPI equation. This calculation has not been validated in all clinical situations. eGFR's persistently <60 mL/min signify possible Chronic Kidney Disease.    Anion gap 10 5 - 15    Comment: Performed at [facility], [address] [place], [place] [address]  CBC with Differential     Status: Abnormal   Collection Time: 11/09/17  9:29 AM  Result Value Ref Range   WBC 5.0 3.8 - 10.6 K/uL   RBC 4.09 (L) 4.40 - 5.90 MIL/uL   Hemoglobin 11.6 (L) 13.0 - 18.0 g/dL   HCT 35.8 (L) 40.0 - 52.0 %   MCV 87.4 80.0 - 100.0 fL   MCH 28.4 26.0 - 34.0 pg   MCHC 32.5 32.0 - 36.0 g/dL   RDW 19.4 (H) 11.5 - 14.5 %   Platelets 172 150 - 440 K/uL   Neutrophils Relative % 66 %   Neutro Abs 3.3 1.4 - 6.5 K/uL   Lymphocytes Relative 16 %   Lymphs Abs 0.8 (L) 1.0 - 3.6 K/uL   Monocytes Relative 12 %   Monocytes Absolute 0.6 0.2 - 1.0 K/uL   Eosinophils Relative 5 %   Eosinophils Absolute 0.3 0 - 0.7 K/uL   Basophils Relative 1 %   Basophils Absolute 0.1 0 - 0.1 K/uL    Comment: Performed at [facility], [address] [place], [place] [address]  Comprehensive metabolic panel     Status: Abnormal   Collection Time: 11/16/17 10:40 AM  Result Value Ref Range   Sodium 139 135 - 145 mmol/L   Potassium 4.3 3.5 - 5.1 mmol/L   Chloride 102 101 - 111 mmol/L   CO2 29 22 - 32 mmol/L   Glucose, Bld 36 (LL) 65 - 99 mg/dL    Comment: RESULT  REPEATED AND VERIFIED CRITICAL RESULT CALLED TO, READ BACK BY AND VERIFIED WITH: [facility] [place] @ 10:50 11/16/2017 PWB    BUN 20 6 - 20 mg/dL   Creatinine, Ser 0.98 0.61 - 1.24 mg/dL   Calcium 8.0 (L) 8.9 - 10.3 mg/dL   Total Protein 6.6 6.5 - 8.1 g/dL  Albumin 3.4 (L) 3.5 - 5.0 g/dL   AST 32 15 - 41 U/L   ALT 21 17 - 63 U/L   Alkaline Phosphatase 207 (H) 38 - 126 U/L   Total Bilirubin 1.3 (H) 0.3 - 1.2 mg/dL   GFR calc non Af Amer >60 >60 mL/min   GFR calc Af Amer >60 >60 mL/min    Comment: (NOTE) The eGFR has been calculated using the CKD EPI equation. This calculation has not been validated in all clinical situations. eGFR's persistently <60 mL/min signify possible Chronic Kidney Disease.    Anion gap 8 5 - 15    Comment: Performed at [facility], [address] [place], [place] [address]  CBC with Differential     Status: Abnormal   Collection Time: 11/16/17 10:40 AM  Result Value Ref Range   WBC 6.1 3.8 - 10.6 K/uL   RBC 4.30 (L) 4.40 - 5.90 MIL/uL   Hemoglobin 12.4 (L) 13.0 - 18.0 g/dL   HCT 38.0 (L) 40.0 - 52.0 %   MCV 88.3 80.0 - 100.0 fL   MCH 28.8 26.0 - 34.0 pg   MCHC 32.6 32.0 - 36.0 g/dL   RDW 18.2 (H) 11.5 - 14.5 %   Platelets 197 150 - 440 K/uL   Neutrophils Relative % 78 %   Neutro Abs 4.8 1.4 - 6.5 K/uL   Lymphocytes Relative 5 %   Lymphs Abs 0.3 (L) 1.0 - 3.6 K/uL   Monocytes Relative 12 %   Monocytes Absolute 0.7 0.2 - 1.0 K/uL   Eosinophils Relative 4 %   Eosinophils Absolute 0.3 0 - 0.7 K/uL   Basophils Relative 1 %   Basophils Absolute 0.0 0 - 0.1 K/uL    Comment: Performed at [facility], [address] [place], [place] [address]  Basic metabolic panel     Status: Abnormal   Collection Time: 12/02/17  6:51 PM  Result Value Ref Range   Sodium 140 135 - 145 mmol/L   Potassium 4.6 3.5 - 5.1 mmol/L   Chloride 105 101 - 111 mmol/L   CO2 22 22 - 32 mmol/L   Glucose, Bld 294 (H) 65 - 99 mg/dL   BUN 25 (H) 6 - 20 mg/dL   Creatinine,  Ser 1.55 (H) 0.61 - 1.24 mg/dL   Calcium 8.4 (L) 8.9 - 10.3 mg/dL   GFR calc non Af Amer 44 (L) >60 mL/min   GFR calc Af Amer 51 (L) >60 mL/min    Comment: (NOTE) The eGFR has been calculated using the CKD EPI equation. This calculation has not been validated in all clinical situations. eGFR's persistently <60 mL/min signify possible Chronic Kidney Disease.    Anion gap 13 5 - 15    Comment: Performed at [facility], [address] [place], [place] [address]  CBC     Status: Abnormal   Collection Time: 12/02/17  6:51 PM  Result Value Ref Range   WBC 3.3 (L) 3.8 - 10.6 K/uL   RBC 4.12 (L) 4.40 - 5.90 MIL/uL   Hemoglobin 12.1 (L) 13.0 - 18.0 g/dL   HCT 36.7 (L) 40.0 - 52.0 %   MCV 89.0 80.0 - 100.0 fL   MCH 29.3 26.0 - 34.0 pg   MCHC 32.9 32.0 - 36.0 g/dL   RDW 16.6 (H) 11.5 - 14.5 %   Platelets 94 (L) 150 - 440 K/uL    Comment: Performed at [facility], [address] [place], [place]  [address]  Brain natriuretic peptide     Status: Abnormal   Collection Time: 12/02/17  6:51 PM  Result Value Ref Range   B Natriuretic Peptide 469.0 (H) 0.0 - 100.0 pg/mL    Comment: Performed at [facility], [address] [place], [place] [address]  Troponin I     Status: None   Collection Time: 12/02/17  6:52 PM  Result Value Ref Range   Troponin I <0.03 <0.03 ng/mL    Comment: Performed at [facility], [address] [place], [place] [address]  Protime-INR     Status: Abnormal   Collection Time: 12/02/17  6:52 PM  Result Value Ref Range   Prothrombin Time 15.3 (H) 11.4 - 15.2 seconds   INR 1.22     Comment: Performed at [facility], [address] [place], [place] [address]  Glucose, capillary     Status: Abnormal   Collection Time: 12/03/17  1:53 AM  Result Value Ref Range   Glucose-Capillary 158 (H) 65 - 99 mg/dL  Troponin I     Status: None   Collection Time: 12/03/17  2:44 AM  Result Value Ref Range   Troponin I <0.03  <0.03 ng/mL    Comment: Performed at [facility], [address] [place], [place] [address]  Glucose, capillary     Status: Abnormal   Collection Time: 12/03/17  7:51 AM  Result Value Ref Range   Glucose-Capillary 166 (H) 65 - 99 mg/dL  Troponin I     Status: None   Collection Time: 12/03/17  9:53 AM  Result Value Ref Range   Troponin I <0.03 <0.03 ng/mL    Comment: Performed at [facility], [address] [place], [place] [address]  Basic metabolic panel     Status: Abnormal   Collection Time: 12/03/17  9:53 AM  Result Value Ref Range   Sodium 138 135 - 145 mmol/L   Potassium 4.6 3.5 - 5.1 mmol/L   Chloride 108 101 - 111 mmol/L   CO2 20 (L) 22 - 32 mmol/L   Glucose, Bld 207 (H) 65 - 99 mg/dL   BUN 27 (H) 6 - 20 mg/dL   Creatinine, Ser 1.45 (H) 0.61 - 1.24 mg/dL   Calcium 8.2 (L) 8.9 - 10.3 mg/dL   GFR calc non Af Amer 48 (L) >60 mL/min   GFR calc Af Amer 55 (L) >60 mL/min    Comment: (NOTE) The eGFR has been calculated using the CKD EPI equation. This calculation has not been validated in all clinical situations. eGFR's persistently <60 mL/min signify possible Chronic Kidney Disease.    Anion gap 10 5 - 15    Comment: Performed at [facility], [address] [place], [place] [address]  CBC     Status: Abnormal   Collection Time: 12/03/17  9:53 AM  Result Value Ref Range   WBC 3.6 (L) 3.8 - 10.6 K/uL   RBC 3.78 (L) 4.40 - 5.90 MIL/uL   Hemoglobin 11.0 (L) 13.0 - 18.0 g/dL   HCT 33.4 (L) 40.0 - 52.0 %   MCV 88.4 80.0 - 100.0 fL   MCH 29.0 26.0 - 34.0 pg   MCHC 32.8 32.0 - 36.0 g/dL   RDW 16.6 (H) 11.5 - 14.5 %   Platelets 91 (L) 150 - 440 K/uL    Comment: Performed at [facility], [address] [place], [place] [address]  Glucose, capillary     Status: Abnormal  Collection Time: 12/03/17 11:48 AM  Result Value Ref Range   Glucose-Capillary 183 (H) 65 - 99 mg/dL  Troponin I     Status: None   Collection Time: 12/03/17   3:24 PM  Result Value Ref Range   Troponin I <0.03 <0.03 ng/mL    Comment: Performed at [facility], [address] [place], [place] [address]  Glucose, capillary     Status: Abnormal   Collection Time: 12/03/17  4:33 PM  Result Value Ref Range   Glucose-Capillary 250 (H) 65 - 99 mg/dL  MRSA PCR Screening     Status: None   Collection Time: 12/03/17  9:10 PM  Result Value Ref Range   MRSA by PCR NEGATIVE NEGATIVE    Comment:        The GeneXpert MRSA Assay (FDA approved for NASAL specimens only), is one component of a comprehensive MRSA colonization surveillance program. It is not intended to diagnose MRSA infection nor to guide or monitor treatment for MRSA infections. Performed at [facility], [address] [place], [place] [address]   Glucose, capillary     Status: Abnormal   Collection Time: 12/03/17 11:31 PM  Result Value Ref Range   Glucose-Capillary 207 (H) 65 - 99 mg/dL  Urinalysis, Complete w Microscopic     Status: Abnormal   Collection Time: 12/03/17 11:47 PM  Result Value Ref Range   Color, Urine YELLOW (A) YELLOW   APPearance HAZY (A) CLEAR   Specific Gravity, Urine 1.017 1.005 - 1.030   pH 5.0 5.0 - 8.0   Glucose, UA NEGATIVE NEGATIVE mg/dL   Hgb urine dipstick NEGATIVE NEGATIVE   Bilirubin Urine NEGATIVE NEGATIVE   Ketones, ur NEGATIVE NEGATIVE mg/dL   Protein, ur NEGATIVE NEGATIVE mg/dL   Nitrite NEGATIVE NEGATIVE   Leukocytes, UA MODERATE (A) NEGATIVE   RBC / HPF 0-5 0 - 5 RBC/hpf   WBC, UA TOO NUMEROUS TO COUNT 0 - 5 WBC/hpf   Bacteria, UA NONE SEEN NONE SEEN   Squamous Epithelial / LPF 0-5 (A) NONE SEEN   Mucus PRESENT    Hyaline Casts, UA PRESENT     Comment: Performed at [facility], [address] [place], [place] [address]  Glucose, capillary     Status: Abnormal   Collection Time: 12/04/17  4:13 AM  Result Value Ref Range   Glucose-Capillary 190 (H) 65 - 99 mg/dL  Basic metabolic panel     Status:  Abnormal   Collection Time: 12/04/17  5:42 AM  Result Value Ref Range   Sodium 141 135 - 145 mmol/L   Potassium 4.9 3.5 - 5.1 mmol/L   Chloride 110 101 - 111 mmol/L   CO2 23 22 - 32 mmol/L   Glucose, Bld 225 (H) 65 - 99 mg/dL   BUN 28 (H) 6 - 20 mg/dL   Creatinine, Ser 1.59 (H) 0.61 - 1.24 mg/dL   Calcium 8.4 (L) 8.9 - 10.3 mg/dL   GFR calc non Af Amer 43 (L) >60 mL/min   GFR calc Af Amer 49 (L) >60 mL/min    Comment: (NOTE) The eGFR has been calculated using the CKD EPI equation. This calculation has not been validated in all clinical situations. eGFR's persistently <60 mL/min signify possible Chronic Kidney Disease.    Anion gap 8 5 - 15    Comment: Performed at [facility], [address] [place], [place] [address]  Magnesium     Status: None   Collection Time:  12/04/17  5:42 AM  Result Value Ref Range   Magnesium 2.0 1.7 - 2.4 mg/dL    Comment: Performed at [facility], [address] [place], [place] [address]  Phosphorus     Status: None   Collection Time: 12/04/17  5:42 AM  Result Value Ref Range   Phosphorus 3.6 2.5 - 4.6 mg/dL    Comment: Performed at [facility], [address] [place], [place] [address]  Glucose, capillary     Status: Abnormal   Collection Time: 12/04/17  8:28 AM  Result Value Ref Range   Glucose-Capillary 176 (H) 65 - 99 mg/dL  ECHOCARDIOGRAM COMPLETE     Status: Abnormal   Collection Time: 12/04/17 10:20 AM  Result Value Ref Range   Weight 4,238.41 oz   Height 70 in   BP 99/72 mmHg   LV PW d 10.6 (A) 0.6 - 1.1 mm   FS 31 28 - 44 %   LA vol 68.1 mL   LA ID, A-P, ES 43 mm   IVS/LV PW RATIO, ED 1.13    LA diam index 1.73 cm/m2   LA vol A4C 74.2 ml   LA vol index 27.4 mL/m2   LA diam end sys 43.00 mm   Lateral S' vel 10.50 cm/sec   TAPSE 12.90 mm  Glucose, capillary     Status: Abnormal   Collection Time: 12/04/17 11:31 AM  Result Value Ref Range   Glucose-Capillary 210 (H) 65 - 99 mg/dL  Glucose,  capillary     Status: Abnormal   Collection Time: 12/04/17  5:14 PM  Result Value Ref Range   Glucose-Capillary 168 (H) 65 - 99 mg/dL  Glucose, capillary     Status: Abnormal   Collection Time: 12/04/17  9:24 PM  Result Value Ref Range   Glucose-Capillary 57 (L) 65 - 99 mg/dL   Comment 1 Repeat Test   Glucose, capillary     Status: None   Collection Time: 12/04/17  9:26 PM  Result Value Ref Range   Glucose-Capillary 84 65 - 99 mg/dL  Glucose, capillary     Status: None   Collection Time: 12/04/17 10:38 PM  Result Value Ref Range   Glucose-Capillary 70 65 - 99 mg/dL  Glucose, capillary     Status: None   Collection Time: 12/04/17 10:39 PM  Result Value Ref Range   Glucose-Capillary 71 65 - 99 mg/dL  Glucose, capillary     Status: Abnormal   Collection Time: 12/05/17  2:13 AM  Result Value Ref Range   Glucose-Capillary 106 (H) 65 - 99 mg/dL  Glucose, capillary     Status: Abnormal   Collection Time: 12/05/17  7:29 AM  Result Value Ref Range   Glucose-Capillary 116 (H) 65 - 99 mg/dL   Comment 1 Document in Chart   Glucose, capillary     Status: Abnormal   Collection Time: 12/05/17 12:00 PM  Result Value Ref Range   Glucose-Capillary 179 (H) 65 - 99 mg/dL  Glucose, capillary     Status: Abnormal   Collection Time: 12/05/17  4:32 PM  Result Value Ref Range   Glucose-Capillary 144 (H) 65 - 99 mg/dL   Comment 1 Document in Chart   Glucose, capillary     Status: Abnormal   Collection Time: 12/05/17 10:59 PM  Result Value Ref Range   Glucose-Capillary 168 (H) 65 - 99 mg/dL  Urine Culture     Status: None   Collection Time: 12/05/17 11:05 PM  Result Value Ref Range   Specimen Description      URINE, RANDOM Performed at [facility], [address], [place], [place] [address]    Special Requests      NONE Performed at [facility], [address] [place], [place] [address]    Culture      NO GROWTH Performed at [facility], [place] [address] [place], [place] [address]    Report Status 12/07/2017 FINAL   Glucose, capillary     Status: Abnormal   Collection Time: 12/06/17  2:43 AM  Result Value Ref Range   Glucose-Capillary 154 (H) 65 - 99 mg/dL  Basic metabolic panel     Status: Abnormal   Collection Time: 12/06/17  5:40 AM  Result Value Ref Range   Sodium 136 135 - 145 mmol/L   Potassium 4.7 3.5 - 5.1 mmol/L   Chloride 108 101 - 111 mmol/L   CO2 22 22 - 32 mmol/L   Glucose, Bld 184 (H) 65 - 99 mg/dL   BUN 33 (H) 6 - 20 mg/dL   Creatinine, Ser 1.41 (H) 0.61 - 1.24 mg/dL   Calcium 8.0 (L) 8.9 - 10.3 mg/dL   GFR calc non Af Amer 49 (L) >60 mL/min   GFR calc Af Amer 57 (L) >60 mL/min    Comment: (NOTE) The eGFR has been calculated using the CKD EPI equation. This calculation has not been validated in all clinical situations. eGFR's persistently <60 mL/min signify possible Chronic Kidney Disease.    Anion gap 6 5 - 15    Comment: Performed at [facility], [address] [place], [place] [address]  Glucose, capillary     Status: Abnormal   Collection Time: 12/06/17  8:21 AM  Result Value Ref Range   Glucose-Capillary 202 (H) 65 - 99 mg/dL  Glucose, capillary     Status: Abnormal   Collection Time: 12/06/17 11:34 AM  Result Value Ref Range   Glucose-Capillary 210 (H) 65 - 99 mg/dL  Aerobic Culture (superficial specimen)     Status: None   Collection Time: 12/06/17  2:28 PM  Result Value Ref Range   Specimen Description WOUND RIGHT FOOT    Special Requests NONE    Gram Stain      FEW WBC PRESENT, PREDOMINANTLY MONONUCLEAR NO ORGANISMS SEEN Performed at [facility], [place] [address] [place],  [address]    Culture RARE ENTEROCOCCUS FAECALIS    Report Status 12/09/2017 FINAL    Organism ID, Bacteria ENTEROCOCCUS FAECALIS       Susceptibility   Enterococcus faecalis - MIC*    AMPICILLIN <=2 SENSITIVE Sensitive     VANCOMYCIN 1 SENSITIVE Sensitive     GENTAMICIN SYNERGY SENSITIVE Sensitive      * RARE ENTEROCOCCUS FAECALIS  Glucose, capillary     Status: Abnormal   Collection Time: 12/06/17  4:08 PM  Result Value Ref Range   Glucose-Capillary 201 (H) 65 - 99 mg/dL  Glucose, capillary     Status: Abnormal   Collection Time: 12/06/17  9:33 PM  Result Value Ref Range   Glucose-Capillary 252 (H) 65 - 99 mg/dL  CBC with Differential/Platelet     Status: Abnormal   Collection Time: 12/06/17  9:47 PM  Result Value Ref Range   WBC 3.4 (L) 3.8 - 10.6 K/uL   RBC 3.89 (L) 4.40 - 5.90 MIL/uL   Hemoglobin 11.3 (L) 13.0 - 18.0 g/dL   HCT 34.9 (L)  40.0 - 52.0 %   MCV 89.7 80.0 - 100.0 fL   MCH 28.9 26.0 - 34.0 pg   MCHC 32.3 32.0 - 36.0 g/dL   RDW 16.3 (H) 11.5 - 14.5 %   Platelets 96 (L) 150 - 440 K/uL   Neutrophils Relative % 69 %   Neutro Abs 2.4 1.4 - 6.5 K/uL   Lymphocytes Relative 10 %   Lymphs Abs 0.3 (L) 1.0 - 3.6 K/uL   Monocytes Relative 16 %   Monocytes Absolute 0.5 0.2 - 1.0 K/uL   Eosinophils Relative 3 %   Eosinophils Absolute 0.1 0 - 0.7 K/uL   Basophils Relative 2 %   Basophils Absolute 0.1 0 - 0.1 K/uL    Comment: Performed at [facility], [address] [place], [place] [address]  Glucose, capillary     Status: Abnormal   Collection Time: 12/06/17 11:37 PM  Result Value Ref Range   Glucose-Capillary 212 (H) 65 - 99 mg/dL  Glucose, capillary     Status: Abnormal   Collection Time: 12/07/17  1:55 AM  Result Value Ref Range   Glucose-Capillary 190 (H) 65 - 99 mg/dL   Comment 1 Notify RN   Basic metabolic panel     Status: Abnormal   Collection Time: 12/07/17  4:40 AM  Result Value Ref Range   Sodium 141 135 - 145 mmol/L   Potassium 5.2 (H) 3.5 - 5.1 mmol/L   Chloride 110 101 - 111 mmol/L   CO2 23 22 - 32 mmol/L   Glucose, Bld 231 (H) 65 - 99 mg/dL   BUN 31 (H) 6 - 20 mg/dL   Creatinine, Ser 1.32 (H) 0.61 - 1.24 mg/dL   Calcium 8.2 (L) 8.9 - 10.3 mg/dL   GFR calc non Af Amer 53 (L) >60 mL/min   GFR calc Af Amer >60 >60 mL/min    Comment:  (NOTE) The eGFR has been calculated using the CKD EPI equation. This calculation has not been validated in all clinical situations. eGFR's persistently <60 mL/min signify possible Chronic Kidney Disease.    Anion gap 8 5 - 15    Comment: Performed at [facility], [address] [place], [place] [address]  CBC with Differential/Platelet     Status: Abnormal   Collection Time: 12/07/17  4:40 AM  Result Value Ref Range   WBC 2.9 (L) 3.8 - 10.6 K/uL   RBC 3.68 (L) 4.40 - 5.90 MIL/uL   Hemoglobin 10.7 (L) 13.0 - 18.0 g/dL   HCT 33.1 (L) 40.0 - 52.0 %   MCV 90.0 80.0 - 100.0 fL   MCH 29.1 26.0 - 34.0 pg   MCHC 32.4 32.0 - 36.0 g/dL   RDW 16.3 (H) 11.5 - 14.5 %   Platelets 93 (L) 150 - 440 K/uL   Neutrophils Relative % 64 %   Neutro Abs 1.9 1.4 - 6.5 K/uL   Lymphocytes Relative 11 %   Lymphs Abs 0.3 (L) 1.0 - 3.6 K/uL   Monocytes Relative 18 %   Monocytes Absolute 0.5 0.2 - 1.0 K/uL   Eosinophils Relative 5 %   Eosinophils Absolute 0.1 0 - 0.7 K/uL   Basophils Relative 2 %   Basophils Absolute 0.1 0 - 0.1 K/uL    Comment: Performed at [facility], [address] [place], [place] [address]  Glucose, capillary     Status: Abnormal   Collection Time: 12/07/17  7:28 AM  Result Value Ref Range   Glucose-Capillary  206 (H) 65 - 99 mg/dL  Glucose, capillary     Status: Abnormal   Collection Time: 12/07/17 11:33 AM  Result Value Ref Range   Glucose-Capillary 213 (H) 65 - 99 mg/dL  I-STAT 4, (NA,K, GLUC, HGB,HCT)     Status: Abnormal   Collection Time: 12/07/17 12:08 PM  Result Value Ref Range   Sodium 143 135 - 145 mmol/L   Potassium 5.3 (H) 3.5 - 5.1 mmol/L   Glucose, Bld 230 (H) 65 - 99 mg/dL   HCT 33.0 (L) 39.0 - 52.0 %   Hemoglobin 11.2 (L) 13.0 - 17.0 g/dL  Glucose, capillary     Status: Abnormal   Collection Time: 12/07/17  2:19 PM  Result Value Ref Range   Glucose-Capillary 183 (H) 65 - 99 mg/dL  Glucose, capillary     Status: Abnormal   Collection  Time: 12/07/17  2:54 PM  Result Value Ref Range   Glucose-Capillary 181 (H) 65 - 99 mg/dL  Glucose, capillary     Status: Abnormal   Collection Time: 12/07/17  5:03 PM  Result Value Ref Range   Glucose-Capillary 179 (H) 65 - 99 mg/dL  Glucose, capillary     Status: Abnormal   Collection Time: 12/07/17  9:16 PM  Result Value Ref Range   Glucose-Capillary 176 (H) 65 - 99 mg/dL   Comment 1 Notify RN    Comment 2 Document in Chart   Glucose, capillary     Status: Abnormal   Collection Time: 12/08/17  8:01 AM  Result Value Ref Range   Glucose-Capillary 147 (H) 65 - 99 mg/dL  Glucose, capillary     Status: Abnormal   Collection Time: 12/08/17 12:16 PM  Result Value Ref Range   Glucose-Capillary 160 (H) 65 - 99 mg/dL  Glucose, capillary     Status: Abnormal   Collection Time: 12/08/17  5:10 PM  Result Value Ref Range   Glucose-Capillary 154 (H) 65 - 99 mg/dL  Glucose, capillary     Status: Abnormal   Collection Time: 12/08/17  9:33 PM  Result Value Ref Range   Glucose-Capillary 103 (H) 65 - 99 mg/dL   Comment 1 Notify RN    Comment 2 Document in Chart   Glucose, capillary     Status: Abnormal   Collection Time: 12/09/17  2:35 AM  Result Value Ref Range   Glucose-Capillary 106 (H) 65 - 99 mg/dL   Comment 1 Notify RN    Comment 2 Document in Chart   CBC     Status: Abnormal   Collection Time: 12/09/17  5:15 AM  Result Value Ref Range   WBC 3.7 (L) 3.8 - 10.6 K/uL   RBC 3.69 (L) 4.40 - 5.90 MIL/uL   Hemoglobin 10.9 (L) 13.0 - 18.0 g/dL   HCT 33.0 (L) 40.0 - 52.0 %   MCV 89.5 80.0 - 100.0 fL   MCH 29.6 26.0 - 34.0 pg   MCHC 33.0 32.0 - 36.0 g/dL   RDW 16.9 (H) 11.5 - 14.5 %   Platelets 101 (L) 150 - 440 K/uL    Comment: Performed at [facility], [address], [place], [place] [address]  Basic metabolic panel     Status: Abnormal   Collection Time: 12/09/17  5:15 AM  Result Value Ref Range   Sodium 141 135 - 145 mmol/L   Potassium 4.2 3.5 - 5.1 mmol/L    Chloride 108 101 - 111 mmol/L   CO2 27 22 - 32 mmol/L  Glucose, Bld 117 (H) 65 - 99 mg/dL   BUN 14 6 - 20 mg/dL   Creatinine, Ser 0.81 0.61 - 1.24 mg/dL   Calcium 7.9 (L) 8.9 - 10.3 mg/dL   GFR calc non Af Amer >60 >60 mL/min   GFR calc Af Amer >60 >60 mL/min    Comment: (NOTE) The eGFR has been calculated using the CKD EPI equation. This calculation has not been validated in all clinical situations. eGFR's persistently <60 mL/min signify possible Chronic Kidney Disease.    Anion gap 6 5 - 15    Comment: Performed at [facility], [address] [place], [place] [address]  Glucose, capillary     Status: None   Collection Time: 12/09/17  8:00 AM  Result Value Ref Range   Glucose-Capillary 76 65 - 99 mg/dL  Glucose, capillary     Status: Abnormal   Collection Time: 12/09/17 12:13 PM  Result Value Ref Range   Glucose-Capillary 154 (H) 65 - 99 mg/dL  Glucose, capillary     Status: Abnormal   Collection Time: 12/09/17  5:15 PM  Result Value Ref Range   Glucose-Capillary 118 (H) 65 - 99 mg/dL  Glucose, capillary     Status: Abnormal   Collection Time: 12/09/17  9:41 PM  Result Value Ref Range   Glucose-Capillary 133 (H) 65 - 99 mg/dL  Glucose, capillary     Status: Abnormal   Collection Time: 12/10/17  2:19 AM  Result Value Ref Range   Glucose-Capillary 148 (H) 65 - 99 mg/dL  Glucose, capillary     Status: Abnormal   Collection Time: 12/10/17  8:25 AM  Result Value Ref Range   Glucose-Capillary 179 (H) 65 - 99 mg/dL  Glucose, capillary     Status: Abnormal   Collection Time: 12/10/17 12:07 PM  Result Value Ref Range   Glucose-Capillary 154 (H) 65 - 99 mg/dL   Comment 1 Notify RN    Comment 2 Document in Chart   Glucose, capillary     Status: Abnormal   Collection Time: 12/10/17  4:24 PM  Result Value Ref Range   Glucose-Capillary 184 (H) 65 - 99 mg/dL   Comment 1 Notify RN    Comment 2 Document in Chart   Glucose, capillary     Status: Abnormal   Collection  Time: 12/10/17  9:08 PM  Result Value Ref Range   Glucose-Capillary 182 (H) 65 - 99 mg/dL  Glucose, capillary     Status: Abnormal   Collection Time: 12/11/17  8:17 AM  Result Value Ref Range   Glucose-Capillary 218 (H) 65 - 99 mg/dL   Comment 1 Notify RN    Comment 2 Document in Chart   Glucose, capillary     Status: Abnormal   Collection Time: 12/11/17 12:08 PM  Result Value Ref Range   Glucose-Capillary 211 (H) 65 - 99 mg/dL    Radiology US Venous Img Upper Uni Right  Result Date: 12/09/2017 CLINICAL DATA:  Right upper extremity pain and swelling EXAM: RIGHT UPPER EXTREMITY VENOUS DOPPLER ULTRASOUND TECHNIQUE: Gray-scale sonography with graded compression, as well as color Doppler and duplex ultrasound were performed to evaluate the upper extremity deep venous system from the level of the subclavian vein and including the jugular, axillary, basilic, radial, ulnar and upper cephalic vein. Spectral Doppler was utilized to evaluate flow at rest and with distal augmentation maneuvers. COMPARISON:  None. FINDINGS: Internal Jugular Vein: No evidence of thrombus. Normal compressibility, respiratory phasicity and response to  augmentation. Subclavian Vein: No evidence of thrombus. Normal compressibility, respiratory phasicity and response to augmentation. Axillary Vein: No evidence of thrombus. Normal compressibility, respiratory phasicity and response to augmentation. Cephalic Vein: No evidence of thrombus. Normal compressibility, respiratory phasicity and response to augmentation. Basilic Vein: No evidence of thrombus. Normal compressibility, respiratory phasicity and response to augmentation. Brachial Veins: No evidence of thrombus. Normal compressibility, respiratory phasicity and response to augmentation. Radial Veins: No evidence of thrombus. Normal compressibility, respiratory phasicity and response to augmentation. Ulnar Veins: No evidence of thrombus. Normal compressibility, respiratory  phasicity and response to augmentation. Venous Reflux:  None visualized. Other Findings:  None visualized. IMPRESSION: No evidence of DVT within the right upper extremity. Electronically Signed   By: Jerilynn Mages.  Shick M.D.   On: 12/09/2017 16:32   Dg Chest Port 1 View  Result Date: 12/07/2017 CLINICAL DATA:  S/p pacemaker EXAM: PORTABLE CHEST 1 VIEW COMPARISON:  12/02/2017, CT chest 10/19/2017, radiograph 08/03/2017 FINDINGS: Right-sided central venous port tip obscured by overlying support devices, catheter seen to the cavoatrial region. Interim insertion of left-sided pacing device with leads projecting over right atrium and right ventricle. Negative for a pneumothorax. Cardiomegaly with vascular congestion and mild interstitial edema. Patchy atelectasis at the bases. No large pleural effusion. Displaced right humeral neck fracture with callus. IMPRESSION: 1. Insertion of left-sided pacing device as above. Negative for pneumothorax 2. Cardiomegaly with vascular congestion and mild interstitial edema. Patchy atelectasis at the right base 3. Right humeral neck fracture with callus formation. Electronically Signed   By: Donavan Foil M.D.   On: 12/07/2017 14:09   Dg Chest Portable 1 View  Result Date: 12/02/2017 CLINICAL DATA:  Syncopal episode landing in sitting position on floor. EXAM: PORTABLE CHEST 1 VIEW COMPARISON:  08/03/2017 FINDINGS: Right IJ Port-A-Cath has tip just below the cavoatrial junction. Lungs are adequately inflated with hazy perihilar opacification likely mild interstitial edema. No evidence of effusion. Mild stable cardiomegaly. Worsening displacement of known right humeral neck fracture. Remainder of the exam is unchanged. IMPRESSION: Mild cardiomegaly with mild interstitial edema. Worsening displacement of known right humeral neck fracture. Electronically Signed   By: Marin Olp M.D.   On: 12/02/2017 20:20   Dg Foot Complete Right  Result Date: 12/02/2017 CLINICAL DATA:  Right foot  laceration.  Fifth toe injury. EXAM: RIGHT FOOT COMPLETE - 3+ VIEW COMPARISON:  None. FINDINGS: Initial encounter. There is extensive artifact from bandage, reportedly there is active bleeding. No opaque foreign body is seen. No definite fracture. An apparent lucency across the base of the fifth proximal phalanx is not seen in the other projections. No dislocation. IMPRESSION: Limited by artifact from bandage. No evidence of opaque foreign body. No suspected fracture. Electronically Signed   By: Monte Fantasia M.D.   On: 12/02/2017 20:19   Dg C-arm 1-60 Min-no Report  Result Date: 12/07/2017 Fluoroscopy was utilized by the requesting physician.  No radiographic interpretation.     Assessment/Plan  Essential hypertension, benign blood pressure control important in reducing the progression of atherosclerotic disease. On appropriate oral medications.   Chronic systolic CHF (congestive heart failure) (HCC) Likely exacerbating lower extremity swelling.  Should discuss with primary care physician  Lymphedema His swelling has markedly worsened and he has skin breakdown.  We will go back into [employer] boots.  These will need to change weekly.  Recheck in 4 weeks    Leotis Pain, MD  12/17/2017 3:12 PM    This note was created with Dragon medical transcription system.  Any errors from dictation are purely unintentional

## 2017-12-17 NOTE — Assessment & Plan Note (Signed)
Blood pressure under good control.  Continue same medication regimen.  Follow pressures.  Follow metabolic panel.   

## 2017-12-17 NOTE — Telephone Encounter (Signed)
Copied from [place]. Topic: Quick Communication - See Telephone Encounter >> Dec 17, 2017  9:26 AM Robina Ade, Helene Kelp D wrote: CRM for notification. See Telephone encounter for: 12/17/17. Patient called and said that he has an appt today to see [facility] and wanted to let Dr. Nicki Reaper know about it.

## 2017-12-17 NOTE — Assessment & Plan Note (Signed)
Progression.  Followed by oncology.

## 2017-12-17 NOTE — Assessment & Plan Note (Signed)
Breathing overall stable.  Persistent lower extremity swelling - improved from previous checks.  Continue current medication regiment.  Scheduled follow up with cardiology.

## 2017-12-17 NOTE — Assessment & Plan Note (Signed)
Likely exacerbating lower extremity swelling.  Should discuss with primary care physician

## 2017-12-17 NOTE — Telephone Encounter (Signed)
Noted. Dr Scott is aware. 

## 2017-12-19 NOTE — Progress Notes (Signed)
[place]  Telephone:(336) [phone] [phone]  ID: Gary [name] OB: 1945/11/16  MR#: [id number]  LOV#:[id number]  Patient Care Team: [name], MD as PCP - General (Internal Medicine)  CHIEF COMPLAINT: Mantle cell lymphoma.  INTERVAL HISTORY: Patient returns returns clinic today for hospital follow-up and discussion whether to reinitiate treatment.  Patient recently had an extended hospital stay for symptomatic bradycardia that required pacemaker placement.  He currently feels well and is nearly back to his baseline. He denies any fevers, chills, or night sweats. He has a good appetite and denies weight loss. He has no neurologic complaints. He denies any chest pain or shortness of breath. He has no nausea, vomiting, constipation, or diarrhea.  He denies any melena or hematochezia.  He has no urinary complaints. Patient offers no specific complaints today.  REVIEW OF SYSTEMS:   Review of Systems  Constitutional: Negative for diaphoresis, fever, malaise/fatigue and weight loss.  Respiratory: Negative.  Negative for cough and shortness of breath.   Cardiovascular: Negative.  Negative for chest pain, palpitations and leg swelling.  Gastrointestinal: Negative.  Negative for abdominal pain, blood in stool and melena.  Genitourinary: Negative.  Negative for hematuria.  Musculoskeletal: Negative.  Negative for falls.  Skin: Negative.  Negative for rash.  Neurological: Negative.  Negative for sensory change and weakness.  Psychiatric/Behavioral: Negative.  The patient is not nervous/anxious.     As per HPI. Otherwise, a complete review of systems is negative.  PAST MEDICAL HISTORY: Past Medical History:  Diagnosis Date  . Depression   . 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 Gastroparesis   . Graves disease   . Hypercholesterolemia   . Hypertension   . Mantle cell lymphoma ([place])   . Peripheral  neuropathy     PAST SURGICAL HISTORY: Past Surgical History:  Procedure Laterality Date  . CHOLECYSTECTOMY    . NEPHRECTOMY     right  . PACEMAKER INSERTION N/A 12/07/2017   Procedure: INSERTION PACEMAKER DUAL CHAMBER INITIAL INSERT;  Surgeon: Isaias Cowman, MD;  Location: [facility] ORS;  Service: Cardiovascular;  Laterality: N/A;  . PORTA CATH INSERTION N/A 11/03/2017   Procedure: PORTA CATH INSERTION;  Surgeon: Katha Cabal, MD;  Location: [place] [facility];  Service: Cardiovascular;  Laterality: N/A;    FAMILY HISTORY Family History  Problem Relation Age of Onset  . Breast cancer Mother   . Diabetes Father   . Cancer Father        Lymphoma  . Alcohol abuse Maternal Uncle   . Diabetes Sister   . Heart disease Unknown        grandfather       ADVANCED DIRECTIVES:    HEALTH MAINTENANCE: Social History   Tobacco Use  . Smoking status: Former Research scientist (life sciences)  . Smokeless tobacco: Current User    Types: Chew  Substance Use Topics  . Alcohol use: Yes    Alcohol/week: 1.8 oz    Types: 3 Cans of beer per week    Comment: nightly  . Drug use: No     Colonoscopy:  PAP:  Bone density:  Lipid panel:  Allergies  Allergen Reactions  . Rofecoxib Nausea Only    Current Outpatient Medications  Medication Sig Dispense Refill  . ACCU-CHEK AVIVA PLUS test strip USE AS INSTRUCTED. USE 1 STRIP EVERY 3 HOURS . ACCU-CHECK AVIVA PLUS TEST STRIP. DX E10.649  12  . acyclovir (ZOVIRAX) 400 MG tablet Take 1  tablet (400 mg total) by mouth daily. 30 tablet 3  . amoxicillin-clavulanate (AUGMENTIN) 875-125 MG tablet Take 1 tablet by mouth every 12 (twelve) hours. 14 tablet 0  . carvedilol (COREG) 3.125 MG tablet Take 3.125 mg by mouth 2 (two) times daily with a meal.     . ferrous sulfate 325 (65 FE) MG tablet Take 325 mg by mouth daily.    . furosemide (LASIX) 20 MG tablet Take 1 tablet (20 mg total) by mouth daily. 30 tablet 1  . GLUCAGON EMERGENCY 1 MG injection     . glucose 4  GM chewable tablet Chew 1 tablet by mouth once as needed for low blood sugar.    . insulin aspart (NOVOLOG) 100 UNIT/ML injection Basal rate 12am 0.8 units per hour, 9am 0.9 units per hour. (total basal insulin 20.7 units). Carbohydrate ratio 1 units for every 8gm of carbohydrate. Correction factor 1 units for every 4m/dl over target cbg. Target CBG 80-120 1 vial 12  . levothyroxine (SYNTHROID, LEVOTHROID) 25 MCG tablet Take 25 mcg by mouth daily.    .Marland Kitchenlidocaine-prilocaine (EMLA) cream Apply 1 application topically as needed. Apply to port prior to chemotherapy appointment, cover with plastic wrap. 30 g 2  . lisinopril (PRINIVIL,ZESTRIL) 20 MG tablet Take 20 mg by mouth daily.    . Multiple Vitamin (MULTIVITAMIN) tablet Take 1 tablet by mouth daily.    . naproxen sodium (ALEVE) 220 MG tablet Take 220 mg by mouth daily as needed.    . ondansetron (ZOFRAN) 8 MG tablet Take 1 tablet (8 mg total) by mouth 2 (two) times daily as needed for refractory nausea / vomiting. 30 tablet 2  . pantoprazole (PROTONIX) 40 MG tablet TAKE 1 TABLET (40 MG TOTAL) BY MOUTH DAILY. 30 tablet 0  . pravastatin (PRAVACHOL) 40 MG tablet Take 1 tablet (40 mg total) by mouth at bedtime. Will need office visit for any more refills 30 tablet 0  . spironolactone (ALDACTONE) 25 MG tablet Take 25 mg by mouth daily.    . prochlorperazine (COMPAZINE) 10 MG tablet TAKE 1 TABLET (10 MG TOTAL) BY MOUTH EVERY 6 (SIX) HOURS AS NEEDED (NAUSEA OR VOMITING). 60 tablet 2   No current facility-administered medications for this visit.   Race: Please given the multiple things to do this would like to do a little bit for myself before do anything  OBJECTIVE: Vitals:   12/23/17 1426  BP: 127/65  Pulse: 85  Resp: 20  Temp: (!) 96.3 F (35.7 C)     Body mass index is 35.02 kg/m.    ECOG FS:0 - Asymptomatic  General: No acute distress. Eyes: Pink conjunctiva, anicteric sclera. Lungs: Clear to auscultation bilaterally. Heart: Regular rate  and rhythm. No rubs, murmurs, or gallops. Abdomen: Soft, nontender, nondistended. No organomegaly noted, normoactive bowel sounds. Musculoskeletal: No edema, cyanosis, or clubbing. Neuro: Alert, answering all questions appropriately. Cranial nerves grossly intact. Skin: No rashes or petechiae noted. Psych: Normal affect. Lymphatics: No cervical, calvicular, axillary or inguinal LAD.   LAB RESULTS:  Lab Results  Component Value Date   NA 137 12/23/2017   K 3.8 12/23/2017   CL 106 12/23/2017   CO2 27 12/23/2017   GLUCOSE 191 (H) 12/23/2017   BUN 16 12/23/2017   CREATININE 1.05 12/23/2017   CALCIUM 8.2 (L) 12/23/2017   PROT 6.0 (L) 12/23/2017   ALBUMIN 2.9 (L) 12/23/2017   AST 24 12/23/2017   ALT 13 (L) 12/23/2017   ALKPHOS 150 (H) 12/23/2017  BILITOT 1.1 12/23/2017   GFRNONAA >60 12/23/2017   GFRAA >60 12/23/2017    Lab Results  Component Value Date   WBC 3.0 (L) 12/23/2017   NEUTROABS 1.5 12/23/2017   HGB 10.5 (L) 12/23/2017   HCT 30.4 (L) 12/23/2017   MCV 89.7 12/23/2017   PLT 124 (L) 12/23/2017   Lab Results  Component Value Date   IRON 13 (L) 08/11/2017   TIBC 223 (L) 01/12/2017   IRONPCTSAT 3.8 (L) 08/11/2017   Lab Results  Component Value Date   FERRITIN 42.0 08/11/2017     STUDIES:   ASSESSMENT: Low-grade mantle cell lymphoma diagnosed in September 2015, iron deficiency anemia.  PLAN:    1. Mantle cell lymphoma, stage III: Patient's initial diagnosis was on inguinal lymph node biopsy on July 12, 2014.  CT scan results from January 2019 reviewed independently with clear evidence of progression of disease. Patient declined bone marrow biopsy to complete the staging workup.  Although, this would not change his overall treatment plan.  Patient received cycle 1, day 1 of Rituxan and Treanda on January 29 and January 30, but then was admitted to the hospital with symptomatic bradycardia requiring pacemaker.  He has not had treatment since that time.  Plan  to repeat CT scans in approximately 2 weeks for reevaluation and to assess patient's new baseline.  Patient will then return to clinic 1-2 days later to reinitiate treatment with cycle 2.   2. Decreased ejection fraction: Previously patient had an ejection fraction of 40% with no clinical CHF. Monitor. 3. Diabetes: Continue insulin pump as directed. 4. Thrombocytopenia: Mild, monitor. 5. Hypertension: Continue treatment and evaluation per PCP. 6.  Iron deficiency anemia: Patient's hemoglobin is only mildly decreased.  Patient last received IV Feraheme on September 28, 2017.  Will consider additional IV Feraheme along with his treatment for mantle cell as above. 7.  Bradycardia: Patient has pacemaker in place.   Patient expressed understanding and was in agreement with this plan. He also understands that He can call clinic at any time with any questions, concerns, or complaints.    Lloyd Huger, MD   12/26/2017 8:31 AM

## 2017-12-20 DIAGNOSIS — E10649 Type 1 diabetes mellitus with hypoglycemia without coma: Secondary | ICD-10-CM | POA: Diagnosis not present

## 2017-12-20 DIAGNOSIS — Z950 Presence of cardiac pacemaker: Secondary | ICD-10-CM | POA: Diagnosis not present

## 2017-12-20 DIAGNOSIS — I428 Other cardiomyopathies: Secondary | ICD-10-CM | POA: Diagnosis not present

## 2017-12-20 DIAGNOSIS — E782 Mixed hyperlipidemia: Secondary | ICD-10-CM | POA: Diagnosis not present

## 2017-12-20 DIAGNOSIS — I5022 Chronic systolic (congestive) heart failure: Secondary | ICD-10-CM | POA: Diagnosis not present

## 2017-12-20 DIAGNOSIS — I6523 Occlusion and stenosis of bilateral carotid arteries: Secondary | ICD-10-CM | POA: Diagnosis not present

## 2017-12-20 DIAGNOSIS — I10 Essential (primary) hypertension: Secondary | ICD-10-CM | POA: Diagnosis not present

## 2017-12-20 DIAGNOSIS — I2510 Atherosclerotic heart disease of native coronary artery without angina pectoris: Secondary | ICD-10-CM | POA: Diagnosis not present

## 2017-12-20 DIAGNOSIS — R600 Localized edema: Secondary | ICD-10-CM | POA: Diagnosis not present

## 2017-12-23 ENCOUNTER — Inpatient Hospital Stay (HOSPITAL_BASED_OUTPATIENT_CLINIC_OR_DEPARTMENT_OTHER): Payer: Medicare Other | Admitting: Oncology

## 2017-12-23 ENCOUNTER — Other Ambulatory Visit: Payer: Self-pay | Admitting: Oncology

## 2017-12-23 ENCOUNTER — Inpatient Hospital Stay: Payer: Medicare Other | Attending: Oncology

## 2017-12-23 ENCOUNTER — Encounter: Payer: Self-pay | Admitting: Oncology

## 2017-12-23 VITALS — BP 127/65 | HR 85 | Temp 96.3°F | Resp 20 | Wt 244.1 lb

## 2017-12-23 DIAGNOSIS — Z87891 Personal history of nicotine dependence: Secondary | ICD-10-CM | POA: Insufficient documentation

## 2017-12-23 DIAGNOSIS — Z807 Family history of other malignant neoplasms of lymphoid, hematopoietic and related tissues: Secondary | ICD-10-CM | POA: Insufficient documentation

## 2017-12-23 DIAGNOSIS — Z79899 Other long term (current) drug therapy: Secondary | ICD-10-CM

## 2017-12-23 DIAGNOSIS — E0500 Thyrotoxicosis with diffuse goiter without thyrotoxic crisis or storm: Secondary | ICD-10-CM

## 2017-12-23 DIAGNOSIS — Z9225 Personal history of immunosupression therapy: Secondary | ICD-10-CM | POA: Diagnosis not present

## 2017-12-23 DIAGNOSIS — C8318 Mantle cell lymphoma, lymph nodes of multiple sites: Secondary | ICD-10-CM | POA: Diagnosis not present

## 2017-12-23 DIAGNOSIS — Z5112 Encounter for antineoplastic immunotherapy: Secondary | ICD-10-CM | POA: Diagnosis not present

## 2017-12-23 DIAGNOSIS — Z794 Long term (current) use of insulin: Secondary | ICD-10-CM | POA: Insufficient documentation

## 2017-12-23 DIAGNOSIS — Z5111 Encounter for antineoplastic chemotherapy: Secondary | ICD-10-CM | POA: Diagnosis not present

## 2017-12-23 DIAGNOSIS — E1143 Type 2 diabetes mellitus with diabetic autonomic (poly)neuropathy: Secondary | ICD-10-CM | POA: Insufficient documentation

## 2017-12-23 DIAGNOSIS — E11311 Type 2 diabetes mellitus with unspecified diabetic retinopathy with macular edema: Secondary | ICD-10-CM

## 2017-12-23 DIAGNOSIS — G629 Polyneuropathy, unspecified: Secondary | ICD-10-CM

## 2017-12-23 DIAGNOSIS — I10 Essential (primary) hypertension: Secondary | ICD-10-CM | POA: Insufficient documentation

## 2017-12-23 DIAGNOSIS — D696 Thrombocytopenia, unspecified: Secondary | ICD-10-CM | POA: Insufficient documentation

## 2017-12-23 DIAGNOSIS — R001 Bradycardia, unspecified: Secondary | ICD-10-CM | POA: Insufficient documentation

## 2017-12-23 DIAGNOSIS — E7800 Pure hypercholesterolemia, unspecified: Secondary | ICD-10-CM | POA: Insufficient documentation

## 2017-12-23 DIAGNOSIS — Z803 Family history of malignant neoplasm of breast: Secondary | ICD-10-CM

## 2017-12-23 DIAGNOSIS — D509 Iron deficiency anemia, unspecified: Secondary | ICD-10-CM

## 2017-12-23 DIAGNOSIS — Z950 Presence of cardiac pacemaker: Secondary | ICD-10-CM

## 2017-12-23 DIAGNOSIS — Z905 Acquired absence of kidney: Secondary | ICD-10-CM | POA: Insufficient documentation

## 2017-12-23 DIAGNOSIS — Z9641 Presence of insulin pump (external) (internal): Secondary | ICD-10-CM | POA: Insufficient documentation

## 2017-12-23 DIAGNOSIS — I2510 Atherosclerotic heart disease of native coronary artery without angina pectoris: Secondary | ICD-10-CM | POA: Diagnosis not present

## 2017-12-23 LAB — CBC WITH DIFFERENTIAL/PLATELET
Basophils Absolute: 0 K/uL (ref 0–0.1)
Basophils Relative: 1 %
Eosinophils Absolute: 0.1 K/uL (ref 0–0.7)
Eosinophils Relative: 5 %
HCT: 30.4 % — ABNORMAL LOW (ref 40.0–52.0)
Hemoglobin: 10.5 g/dL — ABNORMAL LOW (ref 13.0–18.0)
Lymphocytes Relative: 31 %
Lymphs Abs: 0.9 K/uL — ABNORMAL LOW (ref 1.0–3.6)
MCH: 31.1 pg (ref 26.0–34.0)
MCHC: 34.7 g/dL (ref 32.0–36.0)
MCV: 89.7 fL (ref 80.0–100.0)
Monocytes Absolute: 0.4 K/uL (ref 0.2–1.0)
Monocytes Relative: 14 %
Neutro Abs: 1.5 K/uL (ref 1.4–6.5)
Neutrophils Relative %: 49 %
Platelets: 124 K/uL — ABNORMAL LOW (ref 150–440)
RBC: 3.38 MIL/uL — ABNORMAL LOW (ref 4.40–5.90)
RDW: 17.9 % — ABNORMAL HIGH (ref 11.5–14.5)
WBC: 3 K/uL — ABNORMAL LOW (ref 3.8–10.6)

## 2017-12-23 LAB — COMPREHENSIVE METABOLIC PANEL WITH GFR
ALT: 13 U/L — ABNORMAL LOW (ref 17–63)
AST: 24 U/L (ref 15–41)
Albumin: 2.9 g/dL — ABNORMAL LOW (ref 3.5–5.0)
Alkaline Phosphatase: 150 U/L — ABNORMAL HIGH (ref 38–126)
Anion gap: 4 — ABNORMAL LOW (ref 5–15)
BUN: 16 mg/dL (ref 6–20)
CO2: 27 mmol/L (ref 22–32)
Calcium: 8.2 mg/dL — ABNORMAL LOW (ref 8.9–10.3)
Chloride: 106 mmol/L (ref 101–111)
Creatinine, Ser: 1.05 mg/dL (ref 0.61–1.24)
GFR calc Af Amer: >60 mL/min
GFR calc non Af Amer: >60 mL/min
Glucose, Bld: 191 mg/dL — ABNORMAL HIGH (ref 65–99)
Potassium: 3.8 mmol/L (ref 3.5–5.1)
Sodium: 137 mmol/L (ref 135–145)
Total Bilirubin: 1.1 mg/dL (ref 0.3–1.2)
Total Protein: 6 g/dL — ABNORMAL LOW (ref 6.5–8.1)

## 2017-12-23 NOTE — Progress Notes (Signed)
Patient denies any concerns today.  

## 2017-12-24 ENCOUNTER — Encounter (INDEPENDENT_AMBULATORY_CARE_PROVIDER_SITE_OTHER): Payer: Self-pay

## 2017-12-24 ENCOUNTER — Ambulatory Visit (INDEPENDENT_AMBULATORY_CARE_PROVIDER_SITE_OTHER): Payer: Medicare Other | Admitting: Vascular Surgery

## 2017-12-24 VITALS — BP 121/65 | HR 88 | Resp 17 | Ht 70.0 in | Wt 239.0 lb

## 2017-12-24 DIAGNOSIS — I2510 Atherosclerotic heart disease of native coronary artery without angina pectoris: Secondary | ICD-10-CM | POA: Diagnosis not present

## 2017-12-24 DIAGNOSIS — I890 Lymphedema, not elsewhere classified: Secondary | ICD-10-CM

## 2017-12-24 NOTE — Progress Notes (Signed)
History of Present Illness  There is no documented history at this time  Assessments & Plan   There are no diagnoses linked to this encounter.    Additional instructions  Subjective:  Patient presents with venous ulcer of the Bilateral lower extremity.    Procedure:  3 layer unna wrap was placed Bilateral lower extremity.   Plan:   Follow up in one week.  

## 2017-12-27 ENCOUNTER — Ambulatory Visit: Payer: Medicare Other | Admitting: Podiatry

## 2017-12-29 DIAGNOSIS — Z905 Acquired absence of kidney: Secondary | ICD-10-CM | POA: Insufficient documentation

## 2017-12-29 DIAGNOSIS — K811 Chronic cholecystitis: Secondary | ICD-10-CM | POA: Insufficient documentation

## 2017-12-29 DIAGNOSIS — E0500 Thyrotoxicosis with diffuse goiter without thyrotoxic crisis or storm: Secondary | ICD-10-CM | POA: Insufficient documentation

## 2017-12-29 DIAGNOSIS — G629 Polyneuropathy, unspecified: Secondary | ICD-10-CM | POA: Insufficient documentation

## 2017-12-30 ENCOUNTER — Ambulatory Visit (INDEPENDENT_AMBULATORY_CARE_PROVIDER_SITE_OTHER): Payer: Medicare Other | Admitting: Podiatry

## 2017-12-30 ENCOUNTER — Encounter: Payer: Self-pay | Admitting: Podiatry

## 2017-12-30 DIAGNOSIS — I2510 Atherosclerotic heart disease of native coronary artery without angina pectoris: Secondary | ICD-10-CM | POA: Diagnosis not present

## 2017-12-30 DIAGNOSIS — E1142 Type 2 diabetes mellitus with diabetic polyneuropathy: Secondary | ICD-10-CM | POA: Diagnosis not present

## 2017-12-30 DIAGNOSIS — M79675 Pain in left toe(s): Secondary | ICD-10-CM | POA: Diagnosis not present

## 2017-12-30 DIAGNOSIS — M79674 Pain in right toe(s): Secondary | ICD-10-CM

## 2017-12-30 DIAGNOSIS — B351 Tinea unguium: Secondary | ICD-10-CM | POA: Diagnosis not present

## 2017-12-30 NOTE — Progress Notes (Signed)
This patient presents the office with chief complaint of long thick painful nails.  Patient states the nails are painful walking and wearing his shoes.  He states he is unable to self treat.  He presents the office today wearing Unna boots which were previously applied approximately one week ago.  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  Patient has a history of being diabetic with neuropathy.  He presents the office today for preventative foot care services.  He also gives a history of having injured his fifth toe on his right foot, which required stitches  This occurred 3-4 weeks ago.   General Appearance  Alert, conversant and in no acute stress.  Vascular  Deferred due to unna boots  Neurologic  Deferred due to unna boots.  Nails Thick disfigured discolored nails with subungual debris  from hallux to fifth toes bilaterally. No evidence of bacterial infection or drainage bilaterally.  Orthopedic  Deferred due to unna boots.  Skin  normotropic skin with no porokeratosis noted bilaterally.  No signs of infections or ulcers noted.  Healing at base fifth toe at site of laceration.  Onychomycosis  Diabetes with neuropathy   IE  Debride nails  X 10.  RTC 3 months.   [name] DPM

## 2017-12-31 ENCOUNTER — Ambulatory Visit (INDEPENDENT_AMBULATORY_CARE_PROVIDER_SITE_OTHER): Payer: Medicare Other | Admitting: Vascular Surgery

## 2017-12-31 ENCOUNTER — Encounter (INDEPENDENT_AMBULATORY_CARE_PROVIDER_SITE_OTHER): Payer: Self-pay | Admitting: Vascular Surgery

## 2017-12-31 VITALS — BP 117/68 | HR 89 | Resp 17 | Ht 70.0 in | Wt 240.0 lb

## 2017-12-31 DIAGNOSIS — I890 Lymphedema, not elsewhere classified: Secondary | ICD-10-CM | POA: Diagnosis not present

## 2017-12-31 DIAGNOSIS — Z8639 Personal history of other endocrine, nutritional and metabolic disease: Secondary | ICD-10-CM | POA: Diagnosis not present

## 2017-12-31 DIAGNOSIS — I2510 Atherosclerotic heart disease of native coronary artery without angina pectoris: Secondary | ICD-10-CM | POA: Diagnosis not present

## 2017-12-31 DIAGNOSIS — Z4681 Encounter for fitting and adjustment of insulin pump: Secondary | ICD-10-CM | POA: Diagnosis not present

## 2017-12-31 DIAGNOSIS — E10649 Type 1 diabetes mellitus with hypoglycemia without coma: Secondary | ICD-10-CM | POA: Diagnosis not present

## 2017-12-31 DIAGNOSIS — E039 Hypothyroidism, unspecified: Secondary | ICD-10-CM | POA: Diagnosis not present

## 2017-12-31 DIAGNOSIS — E1042 Type 1 diabetes mellitus with diabetic polyneuropathy: Secondary | ICD-10-CM | POA: Diagnosis not present

## 2017-12-31 NOTE — Progress Notes (Signed)
History of Present Illness  There is no documented history at this time  Assessments & Plan   There are no diagnoses linked to this encounter.    Additional instructions  Subjective:  Patient presents with venous ulcer of the Bilateral lower extremity.    Procedure:  3 layer unna wrap was placed Bilateral lower extremity.   Plan:   Follow up in one week.  

## 2018-01-02 NOTE — Progress Notes (Signed)
[name]  Telephone:(336) [phone] [phone]) [phone]  ID: [name] OB: [birth year]  MR#: [id number]  YDX#:[id number]  Patient Care Team: [name], MD as PCP - General (Internal Medicine)  CHIEF COMPLAINT: Mantle cell lymphoma.  INTERVAL HISTORY: Patient returns returns clinic today for further evaluation and consideration of cycle 2, day 1 of Rituxan and Treanda.  He currently feels well and is back to his baseline. He denies any fevers, chills, or night sweats. He has a good appetite and denies weight loss. He has no neurologic complaints. He denies any chest pain or shortness of breath. He has no nausea, vomiting, constipation, or diarrhea.  He denies any melena or hematochezia.  He has no urinary complaints. Patient offers no specific complaints today.  REVIEW OF SYSTEMS:   Review of Systems  Constitutional: Negative for diaphoresis, fever, malaise/fatigue and weight loss.  Respiratory: Negative.  Negative for cough and shortness of breath.   Cardiovascular: Negative.  Negative for chest pain, palpitations and leg swelling.  Gastrointestinal: Negative.  Negative for abdominal pain, blood in stool and melena.  Genitourinary: Negative.  Negative for hematuria.  Musculoskeletal: Negative.  Negative for falls.  Skin: Negative.  Negative for rash.  Neurological: Negative.  Negative for sensory change and weakness.  Psychiatric/Behavioral: Negative.  The patient is not nervous/anxious.     As per HPI. Otherwise, a complete review of systems is negative.  PAST MEDICAL HISTORY: Past Medical History:  Diagnosis Date  . Depression   . 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 Gastroparesis   . Graves disease   . Hypercholesterolemia   . Hypertension   . Mantle cell lymphoma ([place])   . Peripheral neuropathy     PAST SURGICAL HISTORY: Past Surgical History:  Procedure Laterality Date  .  CHOLECYSTECTOMY    . NEPHRECTOMY     right  . PACEMAKER INSERTION N/A 12/07/2017   Procedure: INSERTION PACEMAKER DUAL CHAMBER INITIAL INSERT;  Surgeon: Isaias Cowman, MD;  Location: [facility];  Service: Cardiovascular;  Laterality: N/A;  . PORTA CATH INSERTION N/A 11/03/2017   Procedure: PORTA CATH INSERTION;  Surgeon: Katha Cabal, MD;  Location: [place] [facility];  Service: Cardiovascular;  Laterality: N/A;    FAMILY HISTORY Family History  Problem Relation Age of Onset  . Breast cancer Mother   . Diabetes Father   . Cancer Father        Lymphoma  . Alcohol abuse Maternal Uncle   . Diabetes Sister   . Heart disease Unknown        grandfather       ADVANCED DIRECTIVES:    HEALTH MAINTENANCE: Social History   Tobacco Use  . Smoking status: Former Research scientist (life sciences)  . Smokeless tobacco: Current User    Types: Chew  Substance Use Topics  . Alcohol use: Yes    Alcohol/week: 1.8 oz    Types: 3 Cans of beer per week    Comment: nightly  . Drug use: No     Colonoscopy:  PAP:  Bone density:  Lipid panel:  Allergies  Allergen Reactions  . Rofecoxib Nausea Only    Current Outpatient Medications  Medication Sig Dispense Refill  . ACCU-CHEK AVIVA PLUS test strip USE AS INSTRUCTED. USE 1 STRIP EVERY 3 HOURS . ACCU-CHECK AVIVA PLUS TEST STRIP. DX E10.649  12  . acyclovir (ZOVIRAX) 400 MG tablet Take 1 tablet (400 mg total) by mouth daily. 30 tablet 3  .  carvedilol (COREG) 3.125 MG tablet Take 3.125 mg by mouth 2 (two) times daily with a meal.     . ferrous sulfate 325 (65 FE) MG tablet Take 325 mg by mouth daily.    . furosemide (LASIX) 20 MG tablet Take 1 tablet (20 mg total) by mouth daily. 30 tablet 1  . GLUCAGON EMERGENCY 1 MG injection     . glucose 4 GM chewable tablet Chew 1 tablet by mouth once as needed for low blood sugar.    . insulin aspart (NOVOLOG) 100 UNIT/ML injection Basal rate 12am 0.8 units per hour, 9am 0.9 units per hour. (total basal insulin  20.7 units). Carbohydrate ratio 1 units for every 8gm of carbohydrate. Correction factor 1 units for every 52m/dl over target cbg. Target CBG 80-120 1 vial 12  . levothyroxine (SYNTHROID, LEVOTHROID) 25 MCG tablet Take 25 mcg by mouth daily.    .Marland Kitchenlidocaine-prilocaine (EMLA) cream Apply 1 application topically as needed. Apply to port prior to chemotherapy appointment, cover with plastic wrap. 30 g 2  . lisinopril (PRINIVIL,ZESTRIL) 20 MG tablet Take 20 mg by mouth daily.    . Multiple Vitamin (MULTIVITAMIN) tablet Take 1 tablet by mouth daily.    . naproxen sodium (ALEVE) 220 MG tablet Take 220 mg by mouth daily as needed.    . ondansetron (ZOFRAN) 8 MG tablet Take 1 tablet (8 mg total) by mouth 2 (two) times daily as needed for refractory nausea / vomiting. 30 tablet 2  . pantoprazole (PROTONIX) 40 MG tablet TAKE 1 TABLET (40 MG TOTAL) BY MOUTH DAILY. 30 tablet 0  . pravastatin (PRAVACHOL) 40 MG tablet Take 1 tablet (40 mg total) by mouth at bedtime. Will need office visit for any more refills 30 tablet 0  . prochlorperazine (COMPAZINE) 10 MG tablet TAKE 1 TABLET (10 MG TOTAL) BY MOUTH EVERY 6 (SIX) HOURS AS NEEDED (NAUSEA OR VOMITING). 60 tablet 2  . spironolactone (ALDACTONE) 25 MG tablet Take 25 mg by mouth daily.     No current facility-administered medications for this visit.   Race: Please given the multiple things to do this would like to do a little bit for myself before do anything  OBJECTIVE: Vitals:   01/05/18 0906  BP: 120/65  Pulse: 91  Temp: (!) 96.8 F (36 C)     Body mass index is 34.5 kg/m.    ECOG FS:0 - Asymptomatic  General: No acute distress. Eyes: Pink conjunctiva, anicteric sclera. Lungs: Clear to auscultation bilaterally. Heart: Regular rate and rhythm. No rubs, murmurs, or gallops. Abdomen: Soft, nontender, nondistended. No organomegaly noted, normoactive bowel sounds. Musculoskeletal: No edema, cyanosis, or clubbing. Neuro: Alert, answering all questions  appropriately. Cranial nerves grossly intact. Skin: No rashes or petechiae noted. Psych: Normal affect. Lymphatics: No cervical, calvicular, axillary or inguinal LAD.   LAB RESULTS:  Lab Results  Component Value Date   NA 138 01/05/2018   K 3.8 01/05/2018   CL 112 (H) 01/05/2018   CO2 18 (L) 01/05/2018   GLUCOSE 193 (H) 01/05/2018   BUN 26 (H) 01/05/2018   CREATININE 1.28 (H) 01/05/2018   CALCIUM 8.3 (L) 01/05/2018   PROT 6.3 (L) 01/05/2018   ALBUMIN 3.0 (L) 01/05/2018   AST 27 01/05/2018   ALT 15 (L) 01/05/2018   ALKPHOS 150 (H) 01/05/2018   BILITOT 0.7 01/05/2018   GFRNONAA 55 (L) 01/05/2018   GFRAA >60 01/05/2018    Lab Results  Component Value Date   WBC 4.9 01/05/2018  NEUTROABS 2.7 01/05/2018   HGB 11.2 (L) 01/05/2018   HCT 32.2 (L) 01/05/2018   MCV 91.2 01/05/2018   PLT 159 01/05/2018   Lab Results  Component Value Date   IRON 13 (L) 08/11/2017   TIBC 223 (L) 01/12/2017   IRONPCTSAT 3.8 (L) 08/11/2017   Lab Results  Component Value Date   FERRITIN 42.0 08/11/2017     STUDIES:   ASSESSMENT: Low-grade mantle cell lymphoma diagnosed in September 2015, iron deficiency anemia.  PLAN:    1. Mantle cell lymphoma, stage III: Patient's initial diagnosis was on inguinal lymph node biopsy on July 12, 2014.  CT scan results from January 2019 reviewed independently with clear evidence of progression of disease. Patient declined bone marrow biopsy to complete the staging workup.  Although, this would not change his overall treatment plan.  Patient received cycle 1, day 1 of Rituxan and Treanda on January 29 and January 30, but then was admitted to the hospital with symptomatic bradycardia requiring pacemaker.  He has not had treatment since that time.  Restaging CT scan on January 03, 2018 revealed improvement of disease burden after just one cycle.  We will continue with original plan and complete 3-4 cycles of treatment.  Proceed with cycle 2, day 1 of Rituxan and  Treanda today.  Return to clinic tomorrow for [place] only.  Patient will then return to clinic in 4 weeks for further evaluation and consideration of cycle 3.   2. Decreased ejection fraction: Previously patient had an ejection fraction of 40% with no clinical CHF. Monitor. 3. Diabetes: Continue insulin pump as directed. 4. Thrombocytopenia: Resolved. 5. Hypertension: Continue treatment and evaluation per PCP. 6.  Iron deficiency anemia: Patient's hemoglobin is only mildly decreased.  Patient last received IV Feraheme on September 28, 2017.  Will consider additional IV Feraheme in the future if necessary.   7.  Bradycardia: Patient has pacemaker in place.   Patient expressed understanding and was in agreement with this plan. He also understands that He can call clinic at any time with any questions, concerns, or complaints.    Lloyd Huger, MD   01/08/2018 10:33 AM

## 2018-01-03 ENCOUNTER — Ambulatory Visit
Admission: RE | Admit: 2018-01-03 | Discharge: 2018-01-03 | Disposition: A | Discharge status: Home / Self Care | Payer: Medicare Other | Source: Ambulatory Visit | Attending: Oncology | Admitting: Oncology

## 2018-01-03 DIAGNOSIS — C8318 Mantle cell lymphoma, lymph nodes of multiple sites: Secondary | ICD-10-CM | POA: Insufficient documentation

## 2018-01-03 DIAGNOSIS — R918 Other nonspecific abnormal finding of lung field: Secondary | ICD-10-CM | POA: Diagnosis not present

## 2018-01-03 DIAGNOSIS — S42201A Unspecified fracture of upper end of right humerus, initial encounter for closed fracture: Secondary | ICD-10-CM | POA: Diagnosis not present

## 2018-01-03 DIAGNOSIS — N281 Cyst of kidney, acquired: Secondary | ICD-10-CM | POA: Diagnosis not present

## 2018-01-03 DIAGNOSIS — X58XXXA Exposure to other specified factors, initial encounter: Secondary | ICD-10-CM | POA: Diagnosis not present

## 2018-01-03 DIAGNOSIS — G5632 Lesion of radial nerve, left upper limb: Secondary | ICD-10-CM | POA: Diagnosis not present

## 2018-01-03 MED ORDER — IOPAMIDOL (ISOVUE-300) INJECTION 61%
100.0000 mL | Freq: Once | INTRAVENOUS | Status: AC | PRN
  Administered 2018-01-03: 100 mL via INTRAVENOUS

## 2018-01-05 ENCOUNTER — Inpatient Hospital Stay: Payer: Medicare Other

## 2018-01-05 ENCOUNTER — Other Ambulatory Visit: Payer: Self-pay

## 2018-01-05 ENCOUNTER — Inpatient Hospital Stay (HOSPITAL_BASED_OUTPATIENT_CLINIC_OR_DEPARTMENT_OTHER): Payer: Medicare Other | Admitting: Oncology

## 2018-01-05 VITALS — BP 115/74 | HR 76 | Temp 95.5°F | Resp 18

## 2018-01-05 VITALS — BP 120/65 | HR 91 | Temp 96.8°F | Wt 240.4 lb

## 2018-01-05 DIAGNOSIS — Z905 Acquired absence of kidney: Secondary | ICD-10-CM

## 2018-01-05 DIAGNOSIS — E7800 Pure hypercholesterolemia, unspecified: Secondary | ICD-10-CM

## 2018-01-05 DIAGNOSIS — Z794 Long term (current) use of insulin: Secondary | ICD-10-CM

## 2018-01-05 DIAGNOSIS — G629 Polyneuropathy, unspecified: Secondary | ICD-10-CM

## 2018-01-05 DIAGNOSIS — E1143 Type 2 diabetes mellitus with diabetic autonomic (poly)neuropathy: Secondary | ICD-10-CM

## 2018-01-05 DIAGNOSIS — D509 Iron deficiency anemia, unspecified: Secondary | ICD-10-CM | POA: Diagnosis not present

## 2018-01-05 DIAGNOSIS — Z950 Presence of cardiac pacemaker: Secondary | ICD-10-CM

## 2018-01-05 DIAGNOSIS — Z803 Family history of malignant neoplasm of breast: Secondary | ICD-10-CM

## 2018-01-05 DIAGNOSIS — D696 Thrombocytopenia, unspecified: Secondary | ICD-10-CM | POA: Diagnosis not present

## 2018-01-05 DIAGNOSIS — Z9225 Personal history of immunosupression therapy: Secondary | ICD-10-CM | POA: Diagnosis not present

## 2018-01-05 DIAGNOSIS — C8318 Mantle cell lymphoma, lymph nodes of multiple sites: Secondary | ICD-10-CM | POA: Diagnosis not present

## 2018-01-05 DIAGNOSIS — Z79899 Other long term (current) drug therapy: Secondary | ICD-10-CM

## 2018-01-05 DIAGNOSIS — Z5112 Encounter for antineoplastic immunotherapy: Secondary | ICD-10-CM | POA: Diagnosis not present

## 2018-01-05 DIAGNOSIS — R001 Bradycardia, unspecified: Secondary | ICD-10-CM

## 2018-01-05 DIAGNOSIS — E11311 Type 2 diabetes mellitus with unspecified diabetic retinopathy with macular edema: Secondary | ICD-10-CM | POA: Diagnosis not present

## 2018-01-05 DIAGNOSIS — I10 Essential (primary) hypertension: Secondary | ICD-10-CM

## 2018-01-05 DIAGNOSIS — Z9641 Presence of insulin pump (external) (internal): Secondary | ICD-10-CM

## 2018-01-05 DIAGNOSIS — E0500 Thyrotoxicosis with diffuse goiter without thyrotoxic crisis or storm: Secondary | ICD-10-CM

## 2018-01-05 DIAGNOSIS — I2510 Atherosclerotic heart disease of native coronary artery without angina pectoris: Secondary | ICD-10-CM | POA: Diagnosis not present

## 2018-01-05 DIAGNOSIS — Z807 Family history of other malignant neoplasms of lymphoid, hematopoietic and related tissues: Secondary | ICD-10-CM

## 2018-01-05 DIAGNOSIS — Z5111 Encounter for antineoplastic chemotherapy: Secondary | ICD-10-CM | POA: Diagnosis not present

## 2018-01-05 DIAGNOSIS — Z87891 Personal history of nicotine dependence: Secondary | ICD-10-CM

## 2018-01-05 LAB — CBC WITH DIFFERENTIAL/PLATELET
BASOS PCT: 0 %
Basophils Absolute: 0 10*3/uL (ref 0–0.1)
EOS ABS: 0.1 10*3/uL (ref 0–0.7)
EOS PCT: 3 %
HCT: 32.2 % — ABNORMAL LOW (ref 40.0–52.0)
Hemoglobin: 11.2 g/dL — ABNORMAL LOW (ref 13.0–18.0)
LYMPHS ABS: 1.4 10*3/uL (ref 1.0–3.6)
Lymphocytes Relative: 28 %
MCH: 31.7 pg (ref 26.0–34.0)
MCHC: 34.7 g/dL (ref 32.0–36.0)
MCV: 91.2 fL (ref 80.0–100.0)
MONOS PCT: 13 %
Monocytes Absolute: 0.6 10*3/uL (ref 0.2–1.0)
NEUTROS ABS: 2.7 10*3/uL (ref 1.4–6.5)
Neutrophils Relative %: 56 %
PLATELETS: 159 10*3/uL (ref 150–440)
RBC: 3.54 MIL/uL — AB (ref 4.40–5.90)
RDW: 18.1 % — ABNORMAL HIGH (ref 11.5–14.5)
WBC: 4.9 10*3/uL (ref 3.8–10.6)

## 2018-01-05 LAB — COMPREHENSIVE METABOLIC PANEL
ALBUMIN: 3 g/dL — AB (ref 3.5–5.0)
ALT: 15 U/L — ABNORMAL LOW (ref 17–63)
ANION GAP: 8 (ref 5–15)
AST: 27 U/L (ref 15–41)
Alkaline Phosphatase: 150 U/L — ABNORMAL HIGH (ref 38–126)
BUN: 26 mg/dL — ABNORMAL HIGH (ref 6–20)
CO2: 18 mmol/L — AB (ref 22–32)
Calcium: 8.3 mg/dL — ABNORMAL LOW (ref 8.9–10.3)
Chloride: 112 mmol/L — ABNORMAL HIGH (ref 101–111)
Creatinine, Ser: 1.28 mg/dL — ABNORMAL HIGH (ref 0.61–1.24)
GFR calc Af Amer: >60 mL/min (ref >60)
GFR calc non Af Amer: 55 mL/min — ABNORMAL LOW (ref >60)
GLUCOSE: 193 mg/dL — AB (ref 65–99)
Potassium: 3.8 mmol/L (ref 3.5–5.1)
SODIUM: 138 mmol/L (ref 135–145)
TOTAL PROTEIN: 6.3 g/dL — AB (ref 6.5–8.1)
Total Bilirubin: 0.7 mg/dL (ref 0.3–1.2)

## 2018-01-05 MED ORDER — SODIUM CHLORIDE 0.9% FLUSH
10.0000 mL | INTRAVENOUS | Status: DC | PRN
  Filled 2018-01-05: qty 10

## 2018-01-05 MED ORDER — DIPHENHYDRAMINE HCL 25 MG PO CAPS
25.0000 mg | ORAL_CAPSULE | Freq: Once | ORAL | Status: AC
  Administered 2018-01-05: 25 mg via ORAL
  Filled 2018-01-05: qty 1

## 2018-01-05 MED ORDER — HEPARIN SOD (PORK) LOCK FLUSH 100 UNIT/ML IV SOLN
500.0000 [IU] | Freq: Once | INTRAVENOUS | Status: AC | PRN
  Administered 2018-01-05: 500 [IU]
  Filled 2018-01-05: qty 5

## 2018-01-05 MED ORDER — PALONOSETRON HCL INJECTION 0.25 MG/5ML
0.2500 mg | Freq: Once | INTRAVENOUS | Status: AC
  Administered 2018-01-05: 0.25 mg via INTRAVENOUS

## 2018-01-05 MED ORDER — ACETAMINOPHEN 325 MG PO TABS
650.0000 mg | ORAL_TABLET | Freq: Once | ORAL | Status: AC
  Administered 2018-01-05: 650 mg via ORAL
  Filled 2018-01-05: qty 2

## 2018-01-05 MED ORDER — SODIUM CHLORIDE 0.9 % IV SOLN: 10.0000 mg | Freq: Once | INTRAVENOUS | Status: DC

## 2018-01-05 MED ORDER — SODIUM CHLORIDE 0.9 % IV SOLN
90.0000 mg/m2 | Freq: Once | INTRAVENOUS | Status: AC
  Administered 2018-01-05: 225 mg via INTRAVENOUS
  Filled 2018-01-05: qty 9

## 2018-01-05 MED ORDER — DEXAMETHASONE SODIUM PHOSPHATE 10 MG/ML IJ SOLN
10.0000 mg | Freq: Once | INTRAMUSCULAR | Status: AC
  Administered 2018-01-05: 10 mg via INTRAVENOUS
  Filled 2018-01-05: qty 1

## 2018-01-05 MED ORDER — SODIUM CHLORIDE 0.9 % IV SOLN
375.0000 mg/m2 | Freq: Once | INTRAVENOUS | Status: AC
  Administered 2018-01-05: 900 mg via INTRAVENOUS
  Filled 2018-01-05: qty 50

## 2018-01-05 MED ORDER — SODIUM CHLORIDE 0.9 % IV SOLN: 375.0000 mg/m2 | Freq: Once | INTRAVENOUS | Status: DC

## 2018-01-05 MED ORDER — SODIUM CHLORIDE 0.9 % IV SOLN
Freq: Once | INTRAVENOUS | Status: AC
  Administered 2018-01-05: 10:00:00 via INTRAVENOUS
  Filled 2018-01-05: qty 1000

## 2018-01-06 ENCOUNTER — Inpatient Hospital Stay: Payer: Medicare Other

## 2018-01-06 VITALS — BP 129/73 | HR 85 | Temp 97.7°F | Resp 18

## 2018-01-06 DIAGNOSIS — D509 Iron deficiency anemia, unspecified: Secondary | ICD-10-CM | POA: Diagnosis not present

## 2018-01-06 DIAGNOSIS — Z9225 Personal history of immunosupression therapy: Secondary | ICD-10-CM | POA: Diagnosis not present

## 2018-01-06 DIAGNOSIS — C8318 Mantle cell lymphoma, lymph nodes of multiple sites: Secondary | ICD-10-CM

## 2018-01-06 DIAGNOSIS — Z5112 Encounter for antineoplastic immunotherapy: Secondary | ICD-10-CM | POA: Diagnosis not present

## 2018-01-06 DIAGNOSIS — D696 Thrombocytopenia, unspecified: Secondary | ICD-10-CM | POA: Diagnosis not present

## 2018-01-06 DIAGNOSIS — Z5111 Encounter for antineoplastic chemotherapy: Secondary | ICD-10-CM | POA: Diagnosis not present

## 2018-01-06 MED ORDER — SODIUM CHLORIDE 0.9 % IV SOLN
90.0000 mg/m2 | Freq: Once | INTRAVENOUS | Status: AC
  Administered 2018-01-06: 225 mg via INTRAVENOUS
  Filled 2018-01-06: qty 9

## 2018-01-06 MED ORDER — SODIUM CHLORIDE 0.9% FLUSH
10.0000 mL | INTRAVENOUS | Status: DC | PRN
  Administered 2018-01-06: 10 mL
  Filled 2018-01-06: qty 10

## 2018-01-06 MED ORDER — HEPARIN SOD (PORK) LOCK FLUSH 100 UNIT/ML IV SOLN
500.0000 [IU] | Freq: Once | INTRAVENOUS | Status: AC | PRN
  Administered 2018-01-06: 500 [IU]
  Filled 2018-01-06: qty 5

## 2018-01-06 MED ORDER — DEXAMETHASONE SODIUM PHOSPHATE 100 MG/10ML IJ SOLN: 10.0000 mg | Freq: Once | INTRAMUSCULAR | Status: DC

## 2018-01-06 MED ORDER — SODIUM CHLORIDE 0.9 % IV SOLN
Freq: Once | INTRAVENOUS | Status: AC
  Administered 2018-01-06: 14:00:00 via INTRAVENOUS
  Filled 2018-01-06: qty 1000

## 2018-01-06 MED ORDER — DEXAMETHASONE SODIUM PHOSPHATE 10 MG/ML IJ SOLN
10.0000 mg | Freq: Once | INTRAMUSCULAR | Status: AC
  Administered 2018-01-06: 10 mg via INTRAVENOUS
  Filled 2018-01-06: qty 1

## 2018-01-07 ENCOUNTER — Encounter (INDEPENDENT_AMBULATORY_CARE_PROVIDER_SITE_OTHER): Payer: Self-pay

## 2018-01-07 ENCOUNTER — Ambulatory Visit (INDEPENDENT_AMBULATORY_CARE_PROVIDER_SITE_OTHER): Payer: Medicare Other | Admitting: Vascular Surgery

## 2018-01-07 VITALS — BP 117/65 | HR 96 | Resp 18 | Ht 69.0 in | Wt 243.0 lb

## 2018-01-07 DIAGNOSIS — I890 Lymphedema, not elsewhere classified: Secondary | ICD-10-CM | POA: Diagnosis not present

## 2018-01-07 NOTE — Progress Notes (Signed)
History of Present Illness  There is no documented history at this time  Assessments & Plan   There are no diagnoses linked to this encounter.    Additional instructions  Subjective:  Patient presents with venous ulcer of the Bilateral lower extremity.    Procedure:  3 layer unna wrap was placed Bilateral lower extremity.   Plan:   Follow up in one week.  

## 2018-01-14 ENCOUNTER — Ambulatory Visit (INDEPENDENT_AMBULATORY_CARE_PROVIDER_SITE_OTHER): Payer: Medicare Other | Admitting: Vascular Surgery

## 2018-01-14 ENCOUNTER — Encounter (INDEPENDENT_AMBULATORY_CARE_PROVIDER_SITE_OTHER): Payer: Self-pay | Admitting: Vascular Surgery

## 2018-01-14 VITALS — BP 134/61 | HR 73 | Resp 17 | Ht 69.0 in | Wt 238.0 lb

## 2018-01-14 DIAGNOSIS — I10 Essential (primary) hypertension: Secondary | ICD-10-CM

## 2018-01-14 DIAGNOSIS — I2510 Atherosclerotic heart disease of native coronary artery without angina pectoris: Secondary | ICD-10-CM

## 2018-01-14 DIAGNOSIS — E1151 Type 2 diabetes mellitus with diabetic peripheral angiopathy without gangrene: Secondary | ICD-10-CM

## 2018-01-14 DIAGNOSIS — I890 Lymphedema, not elsewhere classified: Secondary | ICD-10-CM

## 2018-01-14 NOTE — Progress Notes (Signed)
MRN : [id number]  Gary [name] is a 72 y.o. ([birth year]) male who presents with chief complaint of  Chief Complaint  Patient presents with  . Follow-up    unna check  .  History of Present Illness: Patient returns today in follow up of leg swelling.  His swelling is better but far from gone.  The skin is not draining now, but there are some raw areas particularly on the right lower leg.  He has previously been shown to have good arterial flow several months ago.  His reflux study was quite limited several months ago.  He has no fever or chills.    Current Outpatient Medications  Medication Sig Dispense Refill  . ACCU-CHEK AVIVA PLUS test strip USE AS INSTRUCTED. USE 1 STRIP EVERY 3 HOURS . ACCU-CHECK AVIVA PLUS TEST STRIP. DX E10.649  12  . acyclovir (ZOVIRAX) 400 MG tablet Take 1 tablet (400 mg total) by mouth daily. 30 tablet 3  . amoxicillin-clavulanate (AUGMENTIN) 875-125 MG tablet Take 1 tablet by mouth every 12 (twelve) hours. 14 tablet 0  . carvedilol (COREG) 3.125 MG tablet Take 3.125 mg by mouth 2 (two) times daily with a meal.     . ferrous sulfate 325 (65 FE) MG tablet Take 325 mg by mouth daily.    . furosemide (LASIX) 20 MG tablet Take 1 tablet (20 mg total) by mouth daily. 30 tablet 1  . GLUCAGON EMERGENCY 1 MG injection     . glucose 4 GM chewable tablet Chew 1 tablet by mouth once as needed for low blood sugar.    . insulin aspart (NOVOLOG) 100 UNIT/ML injection Basal rate 12am 0.8 units per hour, 9am 0.9 units per hour. (total basal insulin 20.7 units). Carbohydrate ratio 1 units for every 8gm of carbohydrate. Correction factor 1 units for every '50mg'$ /dl over target cbg. Target CBG 80-120 1 vial 12  . levothyroxine (SYNTHROID, LEVOTHROID) 25 MCG tablet Take 25 mcg by mouth daily.    Marland Kitchen lidocaine-prilocaine (EMLA) cream Apply 1 application topically as needed. Apply to port prior to chemotherapy appointment, cover with plastic wrap. 30 g 2  . lisinopril  (PRINIVIL,ZESTRIL) 20 MG tablet Take 20 mg by mouth daily.    . Multiple Vitamin (MULTIVITAMIN) tablet Take 1 tablet by mouth daily.    . naproxen sodium (ALEVE) 220 MG tablet Take 220 mg by mouth daily as needed.    . ondansetron (ZOFRAN) 8 MG tablet Take 1 tablet (8 mg total) by mouth 2 (two) times daily as needed for refractory nausea / vomiting. 30 tablet 2  . pantoprazole (PROTONIX) 40 MG tablet TAKE 1 TABLET (40 MG TOTAL) BY MOUTH DAILY. 30 tablet 0  . pravastatin (PRAVACHOL) 40 MG tablet Take 1 tablet (40 mg total) by mouth at bedtime. Will need office visit for any more refills 30 tablet 0  . prochlorperazine (COMPAZINE) 10 MG tablet Take 1 tablet (10 mg total) by mouth every 6 (six) hours as needed (Nausea or vomiting). 60 tablet 2  . spironolactone (ALDACTONE) 25 MG tablet Take 25 mg by mouth daily.     No current facility-administered medications for this visit.         Past Medical History:  Diagnosis Date  . Depression   . 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 Gastroparesis   . Graves disease   . Hypercholesterolemia   . Hypertension   . Mantle cell lymphoma ([place])   .  Peripheral neuropathy          Past Surgical History:  Procedure Laterality Date  . CHOLECYSTECTOMY    . NEPHRECTOMY     right  . PACEMAKER INSERTION N/A 12/07/2017   Procedure: INSERTION PACEMAKER DUAL CHAMBER INITIAL INSERT;  Surgeon: Isaias Cowman, MD;  Location: [facility]S;  Service: Cardiovascular;  Laterality: N/A;  . PORTA CATH INSERTION N/A 11/03/2017   Procedure: PORTA CATH INSERTION;  Surgeon: Katha Cabal, MD;  Location: [facility];  Service: Cardiovascular;  Laterality: N/A;    Social History Social History        Tobacco Use  . Smoking status: Former Research scientist (life sciences)  . Smokeless tobacco: Current User    Types: Chew  Substance Use Topics  . Alcohol use: Yes    Alcohol/week: 1.8 oz      Types: 3 Cans of beer per week    Comment: nightly  . Drug use: No     Family History      Family History  Problem Relation Age of Onset  . Breast cancer Mother   . Diabetes Father   . Cancer Father        Lymphoma  . Alcohol abuse Maternal Uncle   . Diabetes Sister   . Heart disease Unknown        grandfather         Allergies  Allergen Reactions  . Rofecoxib Nausea Only     REVIEW OF SYSTEMS (Negative unless checked)  Constitutional: '[]'$ Weight loss  '[]'$ Fever  '[]'$ Chills Cardiac: '[]'$ Chest pain   '[]'$ Chest pressure   '[]'$ Palpitations   '[]'$ Shortness of breath when laying flat   '[]'$ Shortness of breath at rest   '[x]'$ Shortness of breath with exertion. Vascular:  '[]'$ Pain in legs with walking   '[]'$ Pain in legs at rest   '[]'$ Pain in legs when laying flat   '[]'$ Claudication   '[]'$ Pain in feet when walking  '[]'$ Pain in feet at rest  '[]'$ Pain in feet when laying flat   '[]'$ History of DVT   '[]'$ Phlebitis   '[x]'$ Swelling in legs   '[]'$ Varicose veins   '[x]'$ Non-healing ulcers Pulmonary:   '[]'$ Uses home oxygen   '[]'$ Productive cough   '[]'$ Hemoptysis   '[]'$ Wheeze  '[]'$ COPD   '[]'$ Asthma Neurologic:  '[]'$ Dizziness  '[]'$ Blackouts   '[]'$ Seizures   '[]'$ History of stroke   '[]'$ History of TIA  '[]'$ Aphasia   '[]'$ Temporary blindness   '[]'$ Dysphagia   '[]'$ Weakness or numbness in arms   '[]'$ Weakness or numbness in legs Musculoskeletal:  '[]'$ Arthritis   '[]'$ Joint swelling   '[]'$ Joint pain   '[]'$ Low back pain Hematologic:  '[]'$ Easy bruising  '[]'$ Easy bleeding   '[]'$ Hypercoagulable state   '[]'$ Anemic   Gastrointestinal:  '[]'$ Blood in stool   '[]'$ Vomiting blood  '[]'$ Gastroesophageal reflux/heartburn   '[]'$ Abdominal pain Genitourinary:  '[]'$ Chronic kidney disease   '[]'$ Difficult urination  '[]'$ Frequent urination  '[]'$ Burning with urination   '[]'$ Hematuria Skin:  '[]'$ Rashes   '[x]'$ Ulcers   '[x]'$ Wounds Psychological:  '[]'$ History of anxiety   '[]'$  History of major depression.      Physical Examination  BP 134/61 (BP Location: Right Arm)   Pulse 73   Resp 17   Ht '5\' 9"'$  (1.753 m)    Wt 108 kg (238 lb)   BMI 35.15 kg/m  Gen:  WD/WN, NAD. Somewhat disheveled Head: [place]/AT, No temporalis wasting. Ear/Nose/Throat: Hearing grossly intact, nares w/o erythema or drainage Eyes: Conjunctiva clear. Sclera non-icteric Neck: Supple.  Trachea midline Pulmonary:  Good air movement, no use of accessory muscles.  Cardiac: irregular Vascular: Vessel  Right Left  Radial Palpable Palpable                          PT Not Palpable Not Palpable  DP 1+ Palpable 1+ Palpable    Musculoskeletal: M/S 5/5 throughout.  No deformity or atrophy. 1-2+ BLE edema. Neurologic: Sensation grossly intact in extremities.  Symmetrical.  Speech is fluent.  Psychiatric: Judgment intact, Mood & affect appropriate for pt's clinical situation. Dermatologic:  mild superficial skin breakdown on the right lateral lower leg in the anterior shin areas bilaterally.       Labs Recent Results (from the past 2160 hour(s))  I-STAT creatinine     Status: None   Collection Time: 10/19/17 10:23 AM  Result Value Ref Range   Creatinine, Ser 1.00 0.61 - 1.24 mg/dL  Comprehensive metabolic panel     Status: Abnormal   Collection Time: 10/21/17 12:43 PM  Result Value Ref Range   Sodium 139 135 - 145 mmol/L   Potassium 4.2 3.5 - 5.1 mmol/L   Chloride 105 101 - 111 mmol/L   CO2 27 22 - 32 mmol/L   Glucose, Bld 79 65 - 99 mg/dL   BUN 16 6 - 20 mg/dL   Creatinine, Ser 0.80 0.61 - 1.24 mg/dL   Calcium 8.2 (L) 8.9 - 10.3 mg/dL   Total Protein 6.9 6.5 - 8.1 g/dL   Albumin 3.3 (L) 3.5 - 5.0 g/dL   AST 30 15 - 41 U/L   ALT 19 17 - 63 U/L   Alkaline Phosphatase 247 (H) 38 - 126 U/L   Total Bilirubin 0.9 0.3 - 1.2 mg/dL   GFR calc non Af Amer >60 >60 mL/min   GFR calc Af Amer >60 >60 mL/min    Comment: (NOTE) The eGFR has been calculated using the CKD EPI equation. This calculation has not been validated in all clinical situations. eGFR's persistently <60 mL/min signify possible Chronic Kidney Disease.     Anion gap 7 5 - 15    Comment: Performed at [facility], [address] [place],  [address]  CBC with Differential     Status: Abnormal   Collection Time: 10/21/17 12:43 PM  Result Value Ref Range   WBC 5.4 3.8 - 10.6 K/uL   RBC 4.37 (L) 4.40 - 5.90 MIL/uL   Hemoglobin 12.0 (L) 13.0 - 18.0 g/dL   HCT 37.8 (L) 40.0 - 52.0 %   MCV 86.6 80.0 - 100.0 fL   MCH 27.5 26.0 - 34.0 pg   MCHC 31.8 (L) 32.0 - 36.0 g/dL   RDW 23.5 (H) 11.5 - 14.5 %   Platelets 174 150 - 440 K/uL   Neutrophils Relative % 70 %   Neutro Abs 3.8 1.4 - 6.5 K/uL   Lymphocytes Relative 15 %   Lymphs Abs 0.8 (L) 1.0 - 3.6 K/uL   Monocytes Relative 10 %   Monocytes Absolute 0.5 0.2 - 1.0 K/uL   Eosinophils Relative 4 %   Eosinophils Absolute 0.2 0 - 0.7 K/uL   Basophils Relative 1 %   Basophils Absolute 0.1 0 - 0.1 K/uL    Comment: Performed at [facility], [address] [place], [place] [address]  Lactate dehydrogenase     Status: None   Collection Time: 10/21/17 12:43 PM  Result Value Ref Range   LDH 170 98 - 192 U/L    Comment: Performed at [facility], [address], [place], [place]  [address]  Hepatitis C antibody screen     Status: None   Collection Time: 10/22/17 11:28 AM  Result Value Ref Range   Hepatitis C Ab NON-REACTIVE NON-REACTI   SIGNAL TO CUT-OFF 0.06 <1.00  Glucose, capillary     Status: Abnormal   Collection Time: 10/28/17 11:53 AM  Result Value Ref Range   Glucose-Capillary 42 (LL) 65 - 99 mg/dL   Comment 1 Notify RN   CBC with Differential     Status: Abnormal   Collection Time: 10/28/17 11:58 AM  Result Value Ref Range   WBC 6.3 3.8 - 10.6 K/uL   RBC 4.48 4.40 - 5.90 MIL/uL   Hemoglobin 12.6 (L) 13.0 - 18.0 g/dL   HCT 39.0 (L) 40.0 - 52.0 %   MCV 87.0 80.0 - 100.0 fL   MCH 28.2 26.0 - 34.0 pg   MCHC 32.4 32.0 - 36.0 g/dL   RDW 22.0 (H) 11.5 - 14.5 %   Platelets 167 150 - 440 K/uL   Neutrophils Relative % 74 %   Neutro Abs 4.6 1.4 - 6.5 K/uL    Lymphocytes Relative 10 %   Lymphs Abs 0.6 (L) 1.0 - 3.6 K/uL   Monocytes Relative 11 %   Monocytes Absolute 0.7 0.2 - 1.0 K/uL   Eosinophils Relative 4 %   Eosinophils Absolute 0.3 0 - 0.7 K/uL   Basophils Relative 1 %   Basophils Absolute 0.1 0 - 0.1 K/uL    Comment: Performed at [facility], [address] [place], [place] [address]  Comprehensive metabolic panel     Status: Abnormal   Collection Time: 10/28/17 11:58 AM  Result Value Ref Range   Sodium 141 135 - 145 mmol/L   Potassium 3.9 3.5 - 5.1 mmol/L   Chloride 104 101 - 111 mmol/L   CO2 28 22 - 32 mmol/L   Glucose, Bld 56 (L) 65 - 99 mg/dL   BUN 14 6 - 20 mg/dL   Creatinine, Ser 1.03 0.61 - 1.24 mg/dL   Calcium 8.4 (L) 8.9 - 10.3 mg/dL   Total Protein 6.5 6.5 - 8.1 g/dL   Albumin 3.2 (L) 3.5 - 5.0 g/dL   AST 32 15 - 41 U/L   ALT 16 (L) 17 - 63 U/L   Alkaline Phosphatase 230 (H) 38 - 126 U/L   Total Bilirubin 1.0 0.3 - 1.2 mg/dL   GFR calc non Af Amer >60 >60 mL/min   GFR calc Af Amer >60 >60 mL/min    Comment: (NOTE) The eGFR has been calculated using the CKD EPI equation. This calculation has not been validated in all clinical situations. eGFR's persistently <60 mL/min signify possible Chronic Kidney Disease.    Anion gap 9 5 - 15    Comment: Performed at [facility], [address] [place], [place] [address]  TSH     Status: None   Collection Time: 10/28/17 11:58 AM  Result Value Ref Range   TSH 3.894 0.350 - 4.500 uIU/mL    Comment: Performed by a 3rd Generation assay with a functional sensitivity of <=0.01 uIU/mL. Performed at [facility], [address] [place], [place] [address]   Hemoglobin A1c     Status: None   Collection Time: 10/28/17 11:58 AM  Result Value Ref Range   Hgb A1c MFr Bld 5.6 4.8 - 5.6 %    Comment: (NOTE) Pre diabetes:          5.7%-6.4% Diabetes:              >  6.4% Glycemic control for   <7.0% adults with diabetes    Mean Plasma Glucose 114.02  mg/dL    Comment: Performed at [facility] [address] [place], [place] [address]  Glucose, capillary     Status: None   Collection Time: 10/28/17 12:16 PM  Result Value Ref Range   Glucose-Capillary 71 65 - 99 mg/dL  Glucose, capillary     Status: Abnormal   Collection Time: 10/28/17 12:52 PM  Result Value Ref Range   Glucose-Capillary 44 (LL) 65 - 99 mg/dL  Glucose, capillary     Status: Abnormal   Collection Time: 10/28/17  1:54 PM  Result Value Ref Range   Glucose-Capillary 105 (H) 65 - 99 mg/dL  Glucose, capillary     Status: Abnormal   Collection Time: 10/28/17  3:09 PM  Result Value Ref Range   Glucose-Capillary 170 (H) 65 - 99 mg/dL  Glucose, capillary     Status: Abnormal   Collection Time: 10/28/17  4:51 PM  Result Value Ref Range   Glucose-Capillary 217 (H) 65 - 99 mg/dL  Glucose, capillary     Status: Abnormal   Collection Time: 10/28/17  5:49 PM  Result Value Ref Range   Glucose-Capillary 264 (H) 65 - 99 mg/dL  Urinalysis, Complete w Microscopic     Status: Abnormal   Collection Time: 10/28/17  8:01 PM  Result Value Ref Range   Color, Urine YELLOW (A) YELLOW   APPearance CLEAR (A) CLEAR   Specific Gravity, Urine 1.025 1.005 - 1.030   pH 5.0 5.0 - 8.0   Glucose, UA >=500 (A) NEGATIVE mg/dL   Hgb urine dipstick NEGATIVE NEGATIVE   Bilirubin Urine NEGATIVE NEGATIVE   Ketones, ur 5 (A) NEGATIVE mg/dL   Protein, ur NEGATIVE NEGATIVE mg/dL   Nitrite NEGATIVE NEGATIVE   Leukocytes, UA NEGATIVE NEGATIVE   RBC / HPF 0-5 0 - 5 RBC/hpf   WBC, UA 0-5 0 - 5 WBC/hpf   Bacteria, UA NONE SEEN NONE SEEN   Squamous Epithelial / LPF 0-5 (A) NONE SEEN   Mucus PRESENT    Hyaline Casts, UA PRESENT     Comment: Performed at [facility], [address] [place], [place] [address]  Glucose, capillary     Status: Abnormal   Collection Time: 10/28/17  9:02 PM  Result Value Ref Range   Glucose-Capillary 402 (H) 65 - 99 mg/dL  Glucose, capillary     Status:  Abnormal   Collection Time: 10/29/17 12:20 AM  Result Value Ref Range   Glucose-Capillary 339 (H) 65 - 99 mg/dL  Glucose, capillary     Status: Abnormal   Collection Time: 10/29/17  5:54 AM  Result Value Ref Range   Glucose-Capillary 376 (H) 65 - 99 mg/dL  CBC     Status: Abnormal   Collection Time: 10/29/17  6:10 AM  Result Value Ref Range   WBC 5.4 3.8 - 10.6 K/uL   RBC 4.21 (L) 4.40 - 5.90 MIL/uL   Hemoglobin 11.9 (L) 13.0 - 18.0 g/dL   HCT 37.4 (L) 40.0 - 52.0 %   MCV 88.7 80.0 - 100.0 fL   MCH 28.3 26.0 - 34.0 pg   MCHC 31.9 (L) 32.0 - 36.0 g/dL   RDW 21.9 (H) 11.5 - 14.5 %   Platelets 147 (L) 150 - 440 K/uL    Comment: Performed at [facility], [address] [place], [place] [address]  Basic metabolic panel  Status: Abnormal   Collection Time: 10/29/17  6:10 AM  Result Value Ref Range   Sodium 137 135 - 145 mmol/L   Potassium 4.9 3.5 - 5.1 mmol/L   Chloride 102 101 - 111 mmol/L   CO2 22 22 - 32 mmol/L   Glucose, Bld 394 (H) 65 - 99 mg/dL   BUN 18 6 - 20 mg/dL   Creatinine, Ser 1.15 0.61 - 1.24 mg/dL   Calcium 8.1 (L) 8.9 - 10.3 mg/dL   GFR calc non Af Amer >60 >60 mL/min   GFR calc Af Amer >60 >60 mL/min    Comment: (NOTE) The eGFR has been calculated using the CKD EPI equation. This calculation has not been validated in all clinical situations. eGFR's persistently <60 mL/min signify possible Chronic Kidney Disease.    Anion gap 13 5 - 15    Comment: Performed at [facility], [address] [place], [place] [address]  Glucose, capillary     Status: Abnormal   Collection Time: 10/29/17  7:24 AM  Result Value Ref Range   Glucose-Capillary 388 (H) 65 - 99 mg/dL  Glucose, capillary     Status: Abnormal   Collection Time: 10/29/17 12:09 PM  Result Value Ref Range   Glucose-Capillary 299 (H) 65 - 99 mg/dL  Comprehensive metabolic panel     Status: Abnormal   Collection Time: 11/02/17  8:22 AM  Result Value Ref Range   Sodium 139 135 -  145 mmol/L   Potassium 4.0 3.5 - 5.1 mmol/L   Chloride 100 (L) 101 - 111 mmol/L   CO2 31 22 - 32 mmol/L   Glucose, Bld 70 65 - 99 mg/dL   BUN 14 6 - 20 mg/dL   Creatinine, Ser 0.95 0.61 - 1.24 mg/dL   Calcium 8.4 (L) 8.9 - 10.3 mg/dL   Total Protein 6.9 6.5 - 8.1 g/dL   Albumin 3.3 (L) 3.5 - 5.0 g/dL   AST 32 15 - 41 U/L   ALT 18 17 - 63 U/L   Alkaline Phosphatase 196 (H) 38 - 126 U/L   Total Bilirubin 0.9 0.3 - 1.2 mg/dL   GFR calc non Af Amer >60 >60 mL/min   GFR calc Af Amer >60 >60 mL/min    Comment: (NOTE) The eGFR has been calculated using the CKD EPI equation. This calculation has not been validated in all clinical situations. eGFR's persistently <60 mL/min signify possible Chronic Kidney Disease.    Anion gap 8 5 - 15    Comment: Performed at [facility], [address] [place], [place] [address]  CBC with Differential     Status: Abnormal   Collection Time: 11/02/17  8:22 AM  Result Value Ref Range   WBC 6.7 3.8 - 10.6 K/uL   RBC 4.38 (L) 4.40 - 5.90 MIL/uL   Hemoglobin 12.4 (L) 13.0 - 18.0 g/dL   HCT 38.4 (L) 40.0 - 52.0 %   MCV 87.7 80.0 - 100.0 fL   MCH 28.4 26.0 - 34.0 pg   MCHC 32.3 32.0 - 36.0 g/dL   RDW 20.6 (H) 11.5 - 14.5 %   Platelets 154 150 - 440 K/uL   Neutrophils Relative % 70 %   Neutro Abs 4.7 1.4 - 6.5 K/uL   Lymphocytes Relative 12 %   Lymphs Abs 0.8 (L) 1.0 - 3.6 K/uL   Monocytes Relative 12 %   Monocytes Absolute 0.8 0.2 - 1.0 K/uL   Eosinophils Relative 5 %   Eosinophils  Absolute 0.3 0 - 0.7 K/uL   Basophils Relative 1 %   Basophils Absolute 0.0 0 - 0.1 K/uL    Comment: Performed at [facility], [address] [place], [place] [address]  Glucose, capillary     Status: Abnormal   Collection Time: 11/03/17 12:32 PM  Result Value Ref Range   Glucose-Capillary 58 (L) 65 - 99 mg/dL  Glucose, capillary     Status: None   Collection Time: 11/03/17  1:16 PM  Result Value Ref Range   Glucose-Capillary 77 65 - 99 mg/dL    Comprehensive metabolic panel     Status: Abnormal   Collection Time: 11/09/17  9:29 AM  Result Value Ref Range   Sodium 140 135 - 145 mmol/L   Potassium 3.8 3.5 - 5.1 mmol/L   Chloride 104 101 - 111 mmol/L   CO2 26 22 - 32 mmol/L   Glucose, Bld 88 65 - 99 mg/dL   BUN 15 6 - 20 mg/dL   Creatinine, Ser 0.94 0.61 - 1.24 mg/dL   Calcium 7.9 (L) 8.9 - 10.3 mg/dL   Total Protein 6.2 (L) 6.5 - 8.1 g/dL   Albumin 3.1 (L) 3.5 - 5.0 g/dL   AST 26 15 - 41 U/L   ALT 14 (L) 17 - 63 U/L   Alkaline Phosphatase 188 (H) 38 - 126 U/L   Total Bilirubin 0.7 0.3 - 1.2 mg/dL   GFR calc non Af Amer >60 >60 mL/min   GFR calc Af Amer >60 >60 mL/min    Comment: (NOTE) The eGFR has been calculated using the CKD EPI equation. This calculation has not been validated in all clinical situations. eGFR's persistently <60 mL/min signify possible Chronic Kidney Disease.    Anion gap 10 5 - 15    Comment: Performed at [facility], [address] [place], [place] [address]  CBC with Differential     Status: Abnormal   Collection Time: 11/09/17  9:29 AM  Result Value Ref Range   WBC 5.0 3.8 - 10.6 K/uL   RBC 4.09 (L) 4.40 - 5.90 MIL/uL   Hemoglobin 11.6 (L) 13.0 - 18.0 g/dL   HCT 35.8 (L) 40.0 - 52.0 %   MCV 87.4 80.0 - 100.0 fL   MCH 28.4 26.0 - 34.0 pg   MCHC 32.5 32.0 - 36.0 g/dL   RDW 19.4 (H) 11.5 - 14.5 %   Platelets 172 150 - 440 K/uL   Neutrophils Relative % 66 %   Neutro Abs 3.3 1.4 - 6.5 K/uL   Lymphocytes Relative 16 %   Lymphs Abs 0.8 (L) 1.0 - 3.6 K/uL   Monocytes Relative 12 %   Monocytes Absolute 0.6 0.2 - 1.0 K/uL   Eosinophils Relative 5 %   Eosinophils Absolute 0.3 0 - 0.7 K/uL   Basophils Relative 1 %   Basophils Absolute 0.1 0 - 0.1 K/uL    Comment: Performed at [facility], [address] [place], [place] [address]  Comprehensive metabolic panel     Status: Abnormal   Collection Time: 11/16/17 10:40 AM  Result Value Ref Range   Sodium 139 135 - 145 mmol/L    Potassium 4.3 3.5 - 5.1 mmol/L   Chloride 102 101 - 111 mmol/L   CO2 29 22 - 32 mmol/L   Glucose, Bld 36 (LL) 65 - 99 mg/dL    Comment: RESULT REPEATED AND VERIFIED CRITICAL RESULT CALLED TO, READ BACK BY AND VERIFIED WITH: [place] @  10:50 11/16/2017 PWB    BUN 20 6 - 20 mg/dL   Creatinine, Ser 0.98 0.61 - 1.24 mg/dL   Calcium 8.0 (L) 8.9 - 10.3 mg/dL   Total Protein 6.6 6.5 - 8.1 g/dL   Albumin 3.4 (L) 3.5 - 5.0 g/dL   AST 32 15 - 41 U/L   ALT 21 17 - 63 U/L   Alkaline Phosphatase 207 (H) 38 - 126 U/L   Total Bilirubin 1.3 (H) 0.3 - 1.2 mg/dL   GFR calc non Af Amer >60 >60 mL/min   GFR calc Af Amer >60 >60 mL/min    Comment: (NOTE) The eGFR has been calculated using the CKD EPI equation. This calculation has not been validated in all clinical situations. eGFR's persistently <60 mL/min signify possible Chronic Kidney Disease.    Anion gap 8 5 - 15    Comment: Performed at [facility], [address] [place], [place] [address]  CBC with Differential     Status: Abnormal   Collection Time: 11/16/17 10:40 AM  Result Value Ref Range   WBC 6.1 3.8 - 10.6 K/uL   RBC 4.30 (L) 4.40 - 5.90 MIL/uL   Hemoglobin 12.4 (L) 13.0 - 18.0 g/dL   HCT 38.0 (L) 40.0 - 52.0 %   MCV 88.3 80.0 - 100.0 fL   MCH 28.8 26.0 - 34.0 pg   MCHC 32.6 32.0 - 36.0 g/dL   RDW 18.2 (H) 11.5 - 14.5 %   Platelets 197 150 - 440 K/uL   Neutrophils Relative % 78 %   Neutro Abs 4.8 1.4 - 6.5 K/uL   Lymphocytes Relative 5 %   Lymphs Abs 0.3 (L) 1.0 - 3.6 K/uL   Monocytes Relative 12 %   Monocytes Absolute 0.7 0.2 - 1.0 K/uL   Eosinophils Relative 4 %   Eosinophils Absolute 0.3 0 - 0.7 K/uL   Basophils Relative 1 %   Basophils Absolute 0.0 0 - 0.1 K/uL    Comment: Performed at [facility], [address] [place], [place] [address]  Basic metabolic panel     Status: Abnormal   Collection Time: 12/02/17  6:51 PM  Result Value Ref Range   Sodium 140 135 - 145 mmol/L   Potassium 4.6 3.5 -  5.1 mmol/L   Chloride 105 101 - 111 mmol/L   CO2 22 22 - 32 mmol/L   Glucose, Bld 294 (H) 65 - 99 mg/dL   BUN 25 (H) 6 - 20 mg/dL   Creatinine, Ser 1.55 (H) 0.61 - 1.24 mg/dL   Calcium 8.4 (L) 8.9 - 10.3 mg/dL   GFR calc non Af Amer 44 (L) >60 mL/min   GFR calc Af Amer 51 (L) >60 mL/min    Comment: (NOTE) The eGFR has been calculated using the CKD EPI equation. This calculation has not been validated in all clinical situations. eGFR's persistently <60 mL/min signify possible Chronic Kidney Disease.    Anion gap 13 5 - 15    Comment: Performed at [facility], [address] [place], [place] [address]  CBC     Status: Abnormal   Collection Time: 12/02/17  6:51 PM  Result Value Ref Range   WBC 3.3 (L) 3.8 - 10.6 K/uL   RBC 4.12 (L) 4.40 - 5.90 MIL/uL   Hemoglobin 12.1 (L) 13.0 - 18.0 g/dL   HCT 36.7 (L) 40.0 - 52.0 %   MCV 89.0 80.0 - 100.0 fL   MCH 29.3 26.0 - 34.0 pg  MCHC 32.9 32.0 - 36.0 g/dL   RDW 16.6 (H) 11.5 - 14.5 %   Platelets 94 (L) 150 - 440 K/uL    Comment: Performed at [facility], [address] [place], [place] [address]  Brain natriuretic peptide     Status: Abnormal   Collection Time: 12/02/17  6:51 PM  Result Value Ref Range   B Natriuretic Peptide 469.0 (H) 0.0 - 100.0 pg/mL    Comment: Performed at [facility], [address] [place], [place] [address]  Troponin I     Status: None   Collection Time: 12/02/17  6:52 PM  Result Value Ref Range   Troponin I <0.03 <0.03 ng/mL    Comment: Performed at [facility], [address] [place], [place] [address]  Protime-INR     Status: Abnormal   Collection Time: 12/02/17  6:52 PM  Result Value Ref Range   Prothrombin Time 15.3 (H) 11.4 - 15.2 seconds   INR 1.22     Comment: Performed at [facility], [address] [place], [place] [address]  Glucose, capillary     Status: Abnormal   Collection Time: 12/03/17  1:53 AM  Result Value Ref Range    Glucose-Capillary 158 (H) 65 - 99 mg/dL  Troponin I     Status: None   Collection Time: 12/03/17  2:44 AM  Result Value Ref Range   Troponin I <0.03 <0.03 ng/mL    Comment: Performed at [facility], [address] [place], [place] [address]  Glucose, capillary     Status: Abnormal   Collection Time: 12/03/17  7:51 AM  Result Value Ref Range   Glucose-Capillary 166 (H) 65 - 99 mg/dL  Troponin I     Status: None   Collection Time: 12/03/17  9:53 AM  Result Value Ref Range   Troponin I <0.03 <0.03 ng/mL    Comment: Performed at [facility], [address] [place], [place] [address]  Basic metabolic panel     Status: Abnormal   Collection Time: 12/03/17  9:53 AM  Result Value Ref Range   Sodium 138 135 - 145 mmol/L   Potassium 4.6 3.5 - 5.1 mmol/L   Chloride 108 101 - 111 mmol/L   CO2 20 (L) 22 - 32 mmol/L   Glucose, Bld 207 (H) 65 - 99 mg/dL   BUN 27 (H) 6 - 20 mg/dL   Creatinine, Ser 1.45 (H) 0.61 - 1.24 mg/dL   Calcium 8.2 (L) 8.9 - 10.3 mg/dL   GFR calc non Af Amer 48 (L) >60 mL/min   GFR calc Af Amer 55 (L) >60 mL/min    Comment: (NOTE) The eGFR has been calculated using the CKD EPI equation. This calculation has not been validated in all clinical situations. eGFR's persistently <60 mL/min signify possible Chronic Kidney Disease.    Anion gap 10 5 - 15    Comment: Performed at [facility], [address] [place], [place] [address]  CBC     Status: Abnormal   Collection Time: 12/03/17  9:53 AM  Result Value Ref Range   WBC 3.6 (L) 3.8 - 10.6 K/uL   RBC 3.78 (L) 4.40 - 5.90 MIL/uL   Hemoglobin 11.0 (L) 13.0 - 18.0 g/dL   HCT 33.4 (L) 40.0 - 52.0 %   MCV 88.4 80.0 - 100.0 fL   MCH 29.0 26.0 - 34.0 pg   MCHC 32.8 32.0 - 36.0 g/dL   RDW 16.6 (H)  11.5 - 14.5 %   Platelets 91 (L) 150 - 440 K/uL    Comment: Performed at [facility], [address] [place], [place] [address]  Glucose, capillary     Status: Abnormal   Collection  Time: 12/03/17 11:48 AM  Result Value Ref Range   Glucose-Capillary 183 (H) 65 - 99 mg/dL  Troponin I     Status: None   Collection Time: 12/03/17  3:24 PM  Result Value Ref Range   Troponin I <0.03 <0.03 ng/mL    Comment: Performed at [facility], [address] [place], [place] [address]  Glucose, capillary     Status: Abnormal   Collection Time: 12/03/17  4:33 PM  Result Value Ref Range   Glucose-Capillary 250 (H) 65 - 99 mg/dL  MRSA PCR Screening     Status: None   Collection Time: 12/03/17  9:10 PM  Result Value Ref Range   MRSA by PCR NEGATIVE NEGATIVE    Comment:        The GeneXpert MRSA Assay (FDA approved for NASAL specimens only), is one component of a comprehensive MRSA colonization surveillance program. It is not intended to diagnose MRSA infection nor to guide or monitor treatment for MRSA infections. Performed at [facility], [address] [place], [place] [address]   Glucose, capillary     Status: Abnormal   Collection Time: 12/03/17 11:31 PM  Result Value Ref Range   Glucose-Capillary 207 (H) 65 - 99 mg/dL  Urinalysis, Complete w Microscopic     Status: Abnormal   Collection Time: 12/03/17 11:47 PM  Result Value Ref Range   Color, Urine YELLOW (A) YELLOW   APPearance HAZY (A) CLEAR   Specific Gravity, Urine 1.017 1.005 - 1.030   pH 5.0 5.0 - 8.0   Glucose, UA NEGATIVE NEGATIVE mg/dL   Hgb urine dipstick NEGATIVE NEGATIVE   Bilirubin Urine NEGATIVE NEGATIVE   Ketones, ur NEGATIVE NEGATIVE mg/dL   Protein, ur NEGATIVE NEGATIVE mg/dL   Nitrite NEGATIVE NEGATIVE   Leukocytes, UA MODERATE (A) NEGATIVE   RBC / HPF 0-5 0 - 5 RBC/hpf   WBC, UA TOO NUMEROUS TO COUNT 0 - 5 WBC/hpf   Bacteria, UA NONE SEEN NONE SEEN   Squamous Epithelial / LPF 0-5 (A) NONE SEEN   Mucus PRESENT    Hyaline Casts, UA PRESENT     Comment: Performed at [facility], [address] [place], [place] [address]  Glucose, capillary     Status:  Abnormal   Collection Time: 12/04/17  4:13 AM  Result Value Ref Range   Glucose-Capillary 190 (H) 65 - 99 mg/dL  Basic metabolic panel     Status: Abnormal   Collection Time: 12/04/17  5:42 AM  Result Value Ref Range   Sodium 141 135 - 145 mmol/L   Potassium 4.9 3.5 - 5.1 mmol/L   Chloride 110 101 - 111 mmol/L   CO2 23 22 - 32 mmol/L   Glucose, Bld 225 (H) 65 - 99 mg/dL   BUN 28 (H) 6 - 20 mg/dL   Creatinine, Ser 1.59 (H) 0.61 - 1.24 mg/dL   Calcium 8.4 (L) 8.9 - 10.3 mg/dL   GFR calc non Af Amer 43 (L) >60 mL/min   GFR calc Af Amer 49 (L) >60 mL/min    Comment: (NOTE) The eGFR has been calculated using the CKD EPI equation. This calculation has not been validated in all clinical situations. eGFR's persistently <60 mL/min signify possible  Chronic Kidney Disease.    Anion gap 8 5 - 15    Comment: Performed at [facility], [address] [place], [place] [address]  Magnesium     Status: None   Collection Time: 12/04/17  5:42 AM  Result Value Ref Range   Magnesium 2.0 1.7 - 2.4 mg/dL    Comment: Performed at [facility], [address] [place], [place] [address]  Phosphorus     Status: None   Collection Time: 12/04/17  5:42 AM  Result Value Ref Range   Phosphorus 3.6 2.5 - 4.6 mg/dL    Comment: Performed at [facility], [address] [place],  [address]  Glucose, capillary     Status: Abnormal   Collection Time: 12/04/17  8:28 AM  Result Value Ref Range   Glucose-Capillary 176 (H) 65 - 99 mg/dL  ECHOCARDIOGRAM COMPLETE     Status: Abnormal   Collection Time: 12/04/17 10:20 AM  Result Value Ref Range   Weight 4,238.41 oz   Height 70 in   BP 99/72 mmHg   LV PW d 10.6 (A) 0.6 - 1.1 mm   FS 31 28 - 44 %   LA vol 68.1 mL   LA ID, A-P, ES 43 mm   IVS/LV PW RATIO, ED 1.13    LA diam index 1.73 cm/m2   LA vol A4C 74.2 ml   LA vol index 27.4 mL/m2   LA diam end sys 43.00 mm   Lateral S' vel 10.50 cm/sec   TAPSE 12.90 mm  Glucose,  capillary     Status: Abnormal   Collection Time: 12/04/17 11:31 AM  Result Value Ref Range   Glucose-Capillary 210 (H) 65 - 99 mg/dL  Glucose, capillary     Status: Abnormal   Collection Time: 12/04/17  5:14 PM  Result Value Ref Range   Glucose-Capillary 168 (H) 65 - 99 mg/dL  Glucose, capillary     Status: Abnormal   Collection Time: 12/04/17  9:24 PM  Result Value Ref Range   Glucose-Capillary 57 (L) 65 - 99 mg/dL   Comment 1 Repeat Test   Glucose, capillary     Status: None   Collection Time: 12/04/17  9:26 PM  Result Value Ref Range   Glucose-Capillary 84 65 - 99 mg/dL  Glucose, capillary     Status: None   Collection Time: 12/04/17 10:38 PM  Result Value Ref Range   Glucose-Capillary 70 65 - 99 mg/dL  Glucose, capillary     Status: None   Collection Time: 12/04/17 10:39 PM  Result Value Ref Range   Glucose-Capillary 71 65 - 99 mg/dL  Glucose, capillary     Status: Abnormal   Collection Time: 12/05/17  2:13 AM  Result Value Ref Range   Glucose-Capillary 106 (H) 65 - 99 mg/dL  Glucose, capillary     Status: Abnormal   Collection Time: 12/05/17  7:29 AM  Result Value Ref Range   Glucose-Capillary 116 (H) 65 - 99 mg/dL   Comment 1 Document in Chart   Glucose, capillary     Status: Abnormal   Collection Time: 12/05/17 12:00 PM  Result Value Ref Range   Glucose-Capillary 179 (H) 65 - 99 mg/dL  Glucose, capillary     Status: Abnormal   Collection Time: 12/05/17  4:32 PM  Result Value Ref Range   Glucose-Capillary 144 (H) 65 - 99 mg/dL   Comment 1 Document in Chart   Glucose, capillary  Status: Abnormal   Collection Time: 12/05/17 10:59 PM  Result Value Ref Range   Glucose-Capillary 168 (H) 65 - 99 mg/dL  Urine Culture     Status: None   Collection Time: 12/05/17 11:05 PM  Result Value Ref Range   Specimen Description      URINE, RANDOM Performed at [facility], [address], [place], [place] [address]    Special Requests      NONE Performed at  [facility], [address], [place] [address]    Culture      NO GROWTH Performed at [facility], [address], [place], [place] [address]    Report Status 12/07/2017 FINAL   Glucose, capillary     Status: Abnormal   Collection Time: 12/06/17  2:43 AM  Result Value Ref Range   Glucose-Capillary 154 (H) 65 - 99 mg/dL  Basic metabolic panel     Status: Abnormal   Collection Time: 12/06/17  5:40 AM  Result Value Ref Range   Sodium 136 135 - 145 mmol/L   Potassium 4.7 3.5 - 5.1 mmol/L   Chloride 108 101 - 111 mmol/L   CO2 22 22 - 32 mmol/L   Glucose, Bld 184 (H) 65 - 99 mg/dL   BUN 33 (H) 6 - 20 mg/dL   Creatinine, Ser 1.41 (H) 0.61 - 1.24 mg/dL   Calcium 8.0 (L) 8.9 - 10.3 mg/dL   GFR calc non Af Amer 49 (L) >60 mL/min   GFR calc Af Amer 57 (L) >60 mL/min    Comment: (NOTE) The eGFR has been calculated using the CKD EPI equation. This calculation has not been validated in all clinical situations. eGFR's persistently <60 mL/min signify possible Chronic Kidney Disease.    Anion gap 6 5 - 15    Comment: Performed at [facility], [address] [place], [place] [address]  Glucose, capillary     Status: Abnormal   Collection Time: 12/06/17  8:21 AM  Result Value Ref Range   Glucose-Capillary 202 (H) 65 - 99 mg/dL  Glucose, capillary     Status: Abnormal   Collection Time: 12/06/17 11:34 AM  Result Value Ref Range   Glucose-Capillary 210 (H) 65 - 99 mg/dL  Aerobic Culture (superficial specimen)     Status: None   Collection Time: 12/06/17  2:28 PM  Result Value Ref Range   Specimen Description WOUND RIGHT FOOT    Special Requests NONE    Gram Stain      FEW WBC PRESENT, PREDOMINANTLY MONONUCLEAR NO ORGANISMS SEEN Performed at [facility], [place] [address] [place], [place] [address]    Culture RARE ENTEROCOCCUS FAECALIS    Report Status 12/09/2017 FINAL    Organism ID, Bacteria ENTEROCOCCUS FAECALIS       Susceptibility    Enterococcus faecalis - MIC*    AMPICILLIN <=2 SENSITIVE Sensitive     VANCOMYCIN 1 SENSITIVE Sensitive     GENTAMICIN SYNERGY SENSITIVE Sensitive     * RARE ENTEROCOCCUS FAECALIS  Glucose, capillary     Status: Abnormal   Collection Time: 12/06/17  4:08 PM  Result Value Ref Range   Glucose-Capillary 201 (H) 65 - 99 mg/dL  Glucose, capillary     Status: Abnormal   Collection Time: 12/06/17  9:33 PM  Result Value Ref Range   Glucose-Capillary 252 (H) 65 - 99 mg/dL  CBC with Differential/Platelet     Status: Abnormal   Collection Time: 12/06/17  9:47 PM  Result Value Ref Range   WBC 3.4 (L) 3.8 - 10.6 K/uL   RBC 3.89 (L) 4.40 - 5.90 MIL/uL   Hemoglobin 11.3 (L) 13.0 - 18.0 g/dL   HCT 34.9 (L) 40.0 - 52.0 %   MCV 89.7 80.0 - 100.0 fL   MCH 28.9 26.0 - 34.0 pg   MCHC 32.3 32.0 - 36.0 g/dL   RDW 16.3 (H) 11.5 - 14.5 %   Platelets 96 (L) 150 - 440 K/uL   Neutrophils Relative % 69 %   Neutro Abs 2.4 1.4 - 6.5 K/uL   Lymphocytes Relative 10 %   Lymphs Abs 0.3 (L) 1.0 - 3.6 K/uL   Monocytes Relative 16 %   Monocytes Absolute 0.5 0.2 - 1.0 K/uL   Eosinophils Relative 3 %   Eosinophils Absolute 0.1 0 - 0.7 K/uL   Basophils Relative 2 %   Basophils Absolute 0.1 0 - 0.1 K/uL    Comment: Performed at [facility], [address] [place], [place] [address]  Glucose, capillary     Status: Abnormal   Collection Time: 12/06/17 11:37 PM  Result Value Ref Range   Glucose-Capillary 212 (H) 65 - 99 mg/dL  Glucose, capillary     Status: Abnormal   Collection Time: 12/07/17  1:55 AM  Result Value Ref Range   Glucose-Capillary 190 (H) 65 - 99 mg/dL   Comment 1 Notify RN   Basic metabolic panel     Status: Abnormal   Collection Time: 12/07/17  4:40 AM  Result Value Ref Range   Sodium 141 135 - 145 mmol/L   Potassium 5.2 (H) 3.5 - 5.1 mmol/L   Chloride 110 101 - 111 mmol/L   CO2 23 22 - 32 mmol/L   Glucose, Bld 231 (H) 65 - 99 mg/dL   BUN 31 (H) 6 - 20 mg/dL   Creatinine, Ser  1.32 (H) 0.61 - 1.24 mg/dL   Calcium 8.2 (L) 8.9 - 10.3 mg/dL   GFR calc non Af Amer 53 (L) >60 mL/min   GFR calc Af Amer >60 >60 mL/min    Comment: (NOTE) The eGFR has been calculated using the CKD EPI equation. This calculation has not been validated in all clinical situations. eGFR's persistently <60 mL/min signify possible Chronic Kidney Disease.    Anion gap 8 5 - 15    Comment: Performed at [facility], [address] [place], [place] [address]  CBC with Differential/Platelet     Status: Abnormal   Collection Time: 12/07/17  4:40 AM  Result Value Ref Range   WBC 2.9 (L) 3.8 - 10.6 K/uL   RBC 3.68 (L) 4.40 - 5.90 MIL/uL   Hemoglobin 10.7 (L) 13.0 - 18.0 g/dL   HCT 33.1 (L) 40.0 - 52.0 %   MCV 90.0 80.0 - 100.0 fL   MCH 29.1 26.0 - 34.0 pg   MCHC 32.4 32.0 - 36.0 g/dL   RDW 16.3 (H) 11.5 - 14.5 %   Platelets 93 (L) 150 - 440 K/uL   Neutrophils Relative % 64 %   Neutro Abs 1.9 1.4 - 6.5 K/uL   Lymphocytes Relative 11 %   Lymphs Abs 0.3 (L) 1.0 - 3.6 K/uL   Monocytes Relative 18 %   Monocytes Absolute 0.5 0.2 - 1.0 K/uL   Eosinophils Relative 5 %   Eosinophils Absolute 0.1 0 - 0.7 K/uL   Basophils Relative 2 %   Basophils Absolute 0.1 0 - 0.1 K/uL  Comment: Performed at [facility], [address] [place], [place] [address]  Glucose, capillary     Status: Abnormal   Collection Time: 12/07/17  7:28 AM  Result Value Ref Range   Glucose-Capillary 206 (H) 65 - 99 mg/dL  Glucose, capillary     Status: Abnormal   Collection Time: 12/07/17 11:33 AM  Result Value Ref Range   Glucose-Capillary 213 (H) 65 - 99 mg/dL  I-STAT 4, (NA,K, GLUC, HGB,HCT)     Status: Abnormal   Collection Time: 12/07/17 12:08 PM  Result Value Ref Range   Sodium 143 135 - 145 mmol/L   Potassium 5.3 (H) 3.5 - 5.1 mmol/L   Glucose, Bld 230 (H) 65 - 99 mg/dL   HCT 33.0 (L) 39.0 - 52.0 %   Hemoglobin 11.2 (L) 13.0 - 17.0 g/dL  Glucose, capillary     Status: Abnormal    Collection Time: 12/07/17  2:19 PM  Result Value Ref Range   Glucose-Capillary 183 (H) 65 - 99 mg/dL  Glucose, capillary     Status: Abnormal   Collection Time: 12/07/17  2:54 PM  Result Value Ref Range   Glucose-Capillary 181 (H) 65 - 99 mg/dL  Glucose, capillary     Status: Abnormal   Collection Time: 12/07/17  5:03 PM  Result Value Ref Range   Glucose-Capillary 179 (H) 65 - 99 mg/dL  Glucose, capillary     Status: Abnormal   Collection Time: 12/07/17  9:16 PM  Result Value Ref Range   Glucose-Capillary 176 (H) 65 - 99 mg/dL   Comment 1 Notify RN    Comment 2 Document in Chart   Glucose, capillary     Status: Abnormal   Collection Time: 12/08/17  8:01 AM  Result Value Ref Range   Glucose-Capillary 147 (H) 65 - 99 mg/dL  Glucose, capillary     Status: Abnormal   Collection Time: 12/08/17 12:16 PM  Result Value Ref Range   Glucose-Capillary 160 (H) 65 - 99 mg/dL  Glucose, capillary     Status: Abnormal   Collection Time: 12/08/17  5:10 PM  Result Value Ref Range   Glucose-Capillary 154 (H) 65 - 99 mg/dL  Glucose, capillary     Status: Abnormal   Collection Time: 12/08/17  9:33 PM  Result Value Ref Range   Glucose-Capillary 103 (H) 65 - 99 mg/dL   Comment 1 Notify RN    Comment 2 Document in Chart   Glucose, capillary     Status: Abnormal   Collection Time: 12/09/17  2:35 AM  Result Value Ref Range   Glucose-Capillary 106 (H) 65 - 99 mg/dL   Comment 1 Notify RN    Comment 2 Document in Chart   CBC     Status: Abnormal   Collection Time: 12/09/17  5:15 AM  Result Value Ref Range   WBC 3.7 (L) 3.8 - 10.6 K/uL   RBC 3.69 (L) 4.40 - 5.90 MIL/uL   Hemoglobin 10.9 (L) 13.0 - 18.0 g/dL   HCT 33.0 (L) 40.0 - 52.0 %   MCV 89.5 80.0 - 100.0 fL   MCH 29.6 26.0 - 34.0 pg   MCHC 33.0 32.0 - 36.0 g/dL   RDW 16.9 (H) 11.5 - 14.5 %   Platelets 101 (L) 150 - 440 K/uL    Comment: Performed at [facility], [address] [place], [place] [address]  Basic metabolic panel      Status: Abnormal   Collection Time: 12/09/17  5:15  AM  Result Value Ref Range   Sodium 141 135 - 145 mmol/L   Potassium 4.2 3.5 - 5.1 mmol/L   Chloride 108 101 - 111 mmol/L   CO2 27 22 - 32 mmol/L   Glucose, Bld 117 (H) 65 - 99 mg/dL   BUN 14 6 - 20 mg/dL   Creatinine, Ser 0.81 0.61 - 1.24 mg/dL   Calcium 7.9 (L) 8.9 - 10.3 mg/dL   GFR calc non Af Amer >60 >60 mL/min   GFR calc Af Amer >60 >60 mL/min    Comment: (NOTE) The eGFR has been calculated using the CKD EPI equation. This calculation has not been validated in all clinical situations. eGFR's persistently <60 mL/min signify possible Chronic Kidney Disease.    Anion gap 6 5 - 15    Comment: Performed at [facility], [address] [place], [place] [address]  Glucose, capillary     Status: None   Collection Time: 12/09/17  8:00 AM  Result Value Ref Range   Glucose-Capillary 76 65 - 99 mg/dL  Glucose, capillary     Status: Abnormal   Collection Time: 12/09/17 12:13 PM  Result Value Ref Range   Glucose-Capillary 154 (H) 65 - 99 mg/dL  Glucose, capillary     Status: Abnormal   Collection Time: 12/09/17  5:15 PM  Result Value Ref Range   Glucose-Capillary 118 (H) 65 - 99 mg/dL  Glucose, capillary     Status: Abnormal   Collection Time: 12/09/17  9:41 PM  Result Value Ref Range   Glucose-Capillary 133 (H) 65 - 99 mg/dL  Glucose, capillary     Status: Abnormal   Collection Time: 12/10/17  2:19 AM  Result Value Ref Range   Glucose-Capillary 148 (H) 65 - 99 mg/dL  Glucose, capillary     Status: Abnormal   Collection Time: 12/10/17  8:25 AM  Result Value Ref Range   Glucose-Capillary 179 (H) 65 - 99 mg/dL  Glucose, capillary     Status: Abnormal   Collection Time: 12/10/17 12:07 PM  Result Value Ref Range   Glucose-Capillary 154 (H) 65 - 99 mg/dL   Comment 1 Notify RN    Comment 2 Document in Chart   Glucose, capillary     Status: Abnormal   Collection Time: 12/10/17  4:24 PM  Result Value Ref Range    Glucose-Capillary 184 (H) 65 - 99 mg/dL   Comment 1 Notify RN    Comment 2 Document in Chart   Glucose, capillary     Status: Abnormal   Collection Time: 12/10/17  9:08 PM  Result Value Ref Range   Glucose-Capillary 182 (H) 65 - 99 mg/dL  Glucose, capillary     Status: Abnormal   Collection Time: 12/11/17  8:17 AM  Result Value Ref Range   Glucose-Capillary 218 (H) 65 - 99 mg/dL   Comment 1 Notify RN    Comment 2 Document in Chart   Glucose, capillary     Status: Abnormal   Collection Time: 12/11/17 12:08 PM  Result Value Ref Range   Glucose-Capillary 211 (H) 65 - 99 mg/dL  Comprehensive metabolic panel     Status: Abnormal   Collection Time: 12/23/17  2:05 PM  Result Value Ref Range   Sodium 137 135 - 145 mmol/L   Potassium 3.8 3.5 - 5.1 mmol/L   Chloride 106 101 - 111 mmol/L   CO2 27 22 - 32 mmol/L   Glucose, Bld 191 (H) 65 - 99 mg/dL  BUN 16 6 - 20 mg/dL   Creatinine, Ser 1.05 0.61 - 1.24 mg/dL   Calcium 8.2 (L) 8.9 - 10.3 mg/dL   Total Protein 6.0 (L) 6.5 - 8.1 g/dL   Albumin 2.9 (L) 3.5 - 5.0 g/dL   AST 24 15 - 41 U/L   ALT 13 (L) 17 - 63 U/L   Alkaline Phosphatase 150 (H) 38 - 126 U/L   Total Bilirubin 1.1 0.3 - 1.2 mg/dL   GFR calc non Af Amer >60 >60 mL/min   GFR calc Af Amer >60 >60 mL/min    Comment: (NOTE) The eGFR has been calculated using the CKD EPI equation. This calculation has not been validated in all clinical situations. eGFR's persistently <60 mL/min signify possible Chronic Kidney Disease.    Anion gap 4 (L) 5 - 15    Comment: Performed at [facility], [address] [place], [place] [address]  CBC with Differential     Status: Abnormal   Collection Time: 12/23/17  2:05 PM  Result Value Ref Range   WBC 3.0 (L) 3.8 - 10.6 K/uL   RBC 3.38 (L) 4.40 - 5.90 MIL/uL   Hemoglobin 10.5 (L) 13.0 - 18.0 g/dL   HCT 30.4 (L) 40.0 - 52.0 %   MCV 89.7 80.0 - 100.0 fL   MCH 31.1 26.0 - 34.0 pg   MCHC 34.7 32.0 - 36.0 g/dL   RDW 17.9 (H) 11.5 - 14.5  %   Platelets 124 (L) 150 - 440 K/uL   Neutrophils Relative % 49 %   Neutro Abs 1.5 1.4 - 6.5 K/uL   Lymphocytes Relative 31 %   Lymphs Abs 0.9 (L) 1.0 - 3.6 K/uL   Monocytes Relative 14 %   Monocytes Absolute 0.4 0.2 - 1.0 K/uL   Eosinophils Relative 5 %   Eosinophils Absolute 0.1 0 - 0.7 K/uL   Basophils Relative 1 %   Basophils Absolute 0.0 0 - 0.1 K/uL    Comment: Performed at [facility] [address] [place], [place] [address]  Comprehensive metabolic panel     Status: Abnormal   Collection Time: 01/05/18  8:50 AM  Result Value Ref Range   Sodium 138 135 - 145 mmol/L   Potassium 3.8 3.5 - 5.1 mmol/L   Chloride 112 (H) 101 - 111 mmol/L   CO2 18 (L) 22 - 32 mmol/L   Glucose, Bld 193 (H) 65 - 99 mg/dL   BUN 26 (H) 6 - 20 mg/dL   Creatinine, Ser 1.28 (H) 0.61 - 1.24 mg/dL   Calcium 8.3 (L) 8.9 - 10.3 mg/dL   Total Protein 6.3 (L) 6.5 - 8.1 g/dL   Albumin 3.0 (L) 3.5 - 5.0 g/dL   AST 27 15 - 41 U/L   ALT 15 (L) 17 - 63 U/L   Alkaline Phosphatase 150 (H) 38 - 126 U/L   Total Bilirubin 0.7 0.3 - 1.2 mg/dL   GFR calc non Af Amer 55 (L) >60 mL/min   GFR calc Af Amer >60 >60 mL/min    Comment: (NOTE) The eGFR has been calculated using the CKD EPI equation. This calculation has not been validated in all clinical situations. eGFR's persistently <60 mL/min signify possible Chronic Kidney Disease.    Anion gap 8 5 - 15    Comment: Performed at [facility], [address] [place], [place] [address]  CBC with Differential     Status: Abnormal   Collection Time: 01/05/18  8:50 AM  Result Value Ref Range   WBC 4.9 3.8 - 10.6 K/uL   RBC 3.54 (L) 4.40 - 5.90 MIL/uL   Hemoglobin 11.2 (L) 13.0 - 18.0 g/dL   HCT 32.2 (L) 40.0 - 52.0 %   MCV 91.2 80.0 - 100.0 fL   MCH 31.7 26.0 - 34.0 pg   MCHC 34.7 32.0 - 36.0 g/dL   RDW 18.1 (H) 11.5 - 14.5 %   Platelets 159 150 - 440 K/uL   Neutrophils Relative % 56 %   Neutro Abs 2.7 1.4 - 6.5 K/uL   Lymphocytes Relative 28  %   Lymphs Abs 1.4 1.0 - 3.6 K/uL   Monocytes Relative 13 %   Monocytes Absolute 0.6 0.2 - 1.0 K/uL   Eosinophils Relative 3 %   Eosinophils Absolute 0.1 0 - 0.7 K/uL   Basophils Relative 0 %   Basophils Absolute 0.0 0 - 0.1 K/uL    Comment: Performed at [facility], [address] [place], [place] [address]    Radiology Ct Chest W Contrast  Result Date: 01/03/2018 CLINICAL DATA:  Patient with history of mantle cell lymphoma. Restaging evaluation. EXAM: CT CHEST, ABDOMEN, AND PELVIS WITH CONTRAST TECHNIQUE: Multidetector CT imaging of the chest, abdomen and pelvis was performed following the standard protocol during bolus administration of intravenous contrast. CONTRAST:  133m ISOVUE-300 IOPAMIDOL (ISOVUE-300) INJECTION 61% COMPARISON:  CT CAP 10/19/2017. FINDINGS: CT CHEST FINDINGS Cardiovascular: Right anterior chest wall is present with tip terminating in the superior vena cava. Normal heart size. Trace fluid superior pericardial recess. Coronary arterial vascular calcifications. Thoracic aortic vascular calcifications. Mediastinum/Nodes: Interval decrease in size of axillary and mediastinal adenopathy. Reference 0.9 cm precarinal lymph node (image 25; series 2), previously 1.3 cm. Reference 1.1 cm right hilar lymph node (image 28; series 2), previously 1.9 cm. Reference 0.7 cm right axillary lymph node (image 19; series 2), previously 1.3 cm. Lungs/Pleura: Central airways are patent. Dependent atelectasis within the bilateral lower lobes. Centrilobular and paraseptal emphysematous changes. Stable 5 mm right upper lobe nodule (image 52; series 3). Stable 5 mm left upper lobe nodule (image 44; series 3). New 3 mm right lower lobe nodule (image 104; series 3). No pleural effusion or pneumothorax. Musculoskeletal: Chronic deformity right humerus. Healing posterior left 9, 10 and eleventh rib fractures. CT ABDOMEN PELVIS FINDINGS Hepatobiliary: Liver is normal in size and contour. Gallbladder  surgically absent. No intrahepatic or extrahepatic biliary ductal dilatation. Pancreas: Unremarkable Spleen: Unremarkable Adrenals/Urinary Tract: Adrenal glands are normal. The left kidney enhances appropriately with contrast. The right kidney is absent. Urinary bladder is unremarkable. Stable 1.2 cm exophytic cyst inferior pole left kidney. Stomach/Bowel: No abnormal bowel wall thickening or evidence for bowel obstruction. No free fluid or free intraperitoneal air. Normal morphology of the stomach. Vascular/Lymphatic: Normal caliber abdominal aorta. Peripheral calcified atherosclerotic plaque. Interval decrease in size of retroperitoneal, pelvic and inguinal lymph nodes. Reference left periaortic lymph node measures 2.4 cm (image 90; series 2), previously 3.9 cm. Reference left external iliac lymph node measures 2.3 cm (image 103; series 2), previously 3.9 cm. Reference right inguinal lymph node measures 1.8 cm (image 131; series 2), previously 2.8 cm. Reproductive: Prostate unremarkable. Other: Small fat containing left inguinal hernia. Musculoskeletal: Lumbar spine degenerative changes. No aggressive or acute appearing osseous lesions. IMPRESSION: Interval decrease in size of adenopathy throughout the chest, abdomen and pelvis. Stable pulmonary upper lobe pulmonary nodules. New right lower lobe pulmonary nodule. Recommend attention on follow-up. Re demonstrated comminuted proximal right humerus fracture.  Electronically Signed   By: Lovey Newcomer M.D.   On: 01/03/2018 13:54   Ct Abdomen Pelvis W Contrast  Result Date: 01/03/2018 CLINICAL DATA:  Patient with history of mantle cell lymphoma. Restaging evaluation. EXAM: CT CHEST, ABDOMEN, AND PELVIS WITH CONTRAST TECHNIQUE: Multidetector CT imaging of the chest, abdomen and pelvis was performed following the standard protocol during bolus administration of intravenous contrast. CONTRAST:  143m ISOVUE-300 IOPAMIDOL (ISOVUE-300) INJECTION 61% COMPARISON:  CT CAP  10/19/2017. FINDINGS: CT CHEST FINDINGS Cardiovascular: Right anterior chest wall is present with tip terminating in the superior vena cava. Normal heart size. Trace fluid superior pericardial recess. Coronary arterial vascular calcifications. Thoracic aortic vascular calcifications. Mediastinum/Nodes: Interval decrease in size of axillary and mediastinal adenopathy. Reference 0.9 cm precarinal lymph node (image 25; series 2), previously 1.3 cm. Reference 1.1 cm right hilar lymph node (image 28; series 2), previously 1.9 cm. Reference 0.7 cm right axillary lymph node (image 19; series 2), previously 1.3 cm. Lungs/Pleura: Central airways are patent. Dependent atelectasis within the bilateral lower lobes. Centrilobular and paraseptal emphysematous changes. Stable 5 mm right upper lobe nodule (image 52; series 3). Stable 5 mm left upper lobe nodule (image 44; series 3). New 3 mm right lower lobe nodule (image 104; series 3). No pleural effusion or pneumothorax. Musculoskeletal: Chronic deformity right humerus. Healing posterior left 9, 10 and eleventh rib fractures. CT ABDOMEN PELVIS FINDINGS Hepatobiliary: Liver is normal in size and contour. Gallbladder surgically absent. No intrahepatic or extrahepatic biliary ductal dilatation. Pancreas: Unremarkable Spleen: Unremarkable Adrenals/Urinary Tract: Adrenal glands are normal. The left kidney enhances appropriately with contrast. The right kidney is absent. Urinary bladder is unremarkable. Stable 1.2 cm exophytic cyst inferior pole left kidney. Stomach/Bowel: No abnormal bowel wall thickening or evidence for bowel obstruction. No free fluid or free intraperitoneal air. Normal morphology of the stomach. Vascular/Lymphatic: Normal caliber abdominal aorta. Peripheral calcified atherosclerotic plaque. Interval decrease in size of retroperitoneal, pelvic and inguinal lymph nodes. Reference left periaortic lymph node measures 2.4 cm (image 90; series 2), previously 3.9 cm.  Reference left external iliac lymph node measures 2.3 cm (image 103; series 2), previously 3.9 cm. Reference right inguinal lymph node measures 1.8 cm (image 131; series 2), previously 2.8 cm. Reproductive: Prostate unremarkable. Other: Small fat containing left inguinal hernia. Musculoskeletal: Lumbar spine degenerative changes. No aggressive or acute appearing osseous lesions. IMPRESSION: Interval decrease in size of adenopathy throughout the chest, abdomen and pelvis. Stable pulmonary upper lobe pulmonary nodules. New right lower lobe pulmonary nodule. Recommend attention on follow-up. Re demonstrated comminuted proximal right humerus fracture. Electronically Signed   By: DLovey NewcomerM.D.   On: 01/03/2018 13:54    Assessment/Plan Essential hypertension, benign blood pressure control important in reducing the progression of atherosclerotic disease. On appropriate oral medications.   Chronic systolic CHF (congestive heart failure) (HCC) Likely exacerbating lower extremity swelling.  Should discuss with primary care physician   Diabetes ([facility] blood glucose control important in reducing the progression of atherosclerotic disease. Also, involved in wound healing. On appropriate medications.   Lymphedema His swelling and skin has gotten better with several weeks of Unna boots, but is far from well.  I think at this point we might get benefit of repeating a venous reflux study now that his swelling is under better control.  We may be able to gain more information from that.  We will continue Unna boots for several more weeks.    JLeotis Pain MD  01/14/2018 3:50 PM  This note was created with Dragon medical transcription system.  Any errors from dictation are purely unintentional

## 2018-01-14 NOTE — Assessment & Plan Note (Signed)
His swelling and skin has gotten better with several weeks of Unna boots, but is far from well.  I think at this point we might get benefit of repeating a venous reflux study now that his swelling is under better control.  We may be able to gain more information from that.  We will continue Unna boots for several more weeks.

## 2018-01-14 NOTE — Assessment & Plan Note (Signed)
blood glucose control important in reducing the progression of atherosclerotic disease. Also, involved in wound healing. On appropriate medications.  

## 2018-01-17 DIAGNOSIS — R001 Bradycardia, unspecified: Secondary | ICD-10-CM | POA: Diagnosis not present

## 2018-01-17 DIAGNOSIS — I10 Essential (primary) hypertension: Secondary | ICD-10-CM | POA: Diagnosis not present

## 2018-01-17 DIAGNOSIS — I5022 Chronic systolic (congestive) heart failure: Secondary | ICD-10-CM | POA: Diagnosis not present

## 2018-01-17 DIAGNOSIS — R600 Localized edema: Secondary | ICD-10-CM | POA: Diagnosis not present

## 2018-01-17 DIAGNOSIS — I2510 Atherosclerotic heart disease of native coronary artery without angina pectoris: Secondary | ICD-10-CM | POA: Diagnosis not present

## 2018-01-21 ENCOUNTER — Encounter (INDEPENDENT_AMBULATORY_CARE_PROVIDER_SITE_OTHER): Payer: Self-pay | Admitting: Vascular Surgery

## 2018-01-21 ENCOUNTER — Ambulatory Visit (INDEPENDENT_AMBULATORY_CARE_PROVIDER_SITE_OTHER): Payer: Medicare Other | Admitting: Vascular Surgery

## 2018-01-21 VITALS — BP 124/70 | HR 74 | Resp 17 | Ht 70.0 in | Wt 235.0 lb

## 2018-01-21 DIAGNOSIS — I890 Lymphedema, not elsewhere classified: Secondary | ICD-10-CM

## 2018-01-21 NOTE — Progress Notes (Signed)
History of Present Illness  There is no documented history at this time  Assessments & Plan   There are no diagnoses linked to this encounter.    Additional instructions  Subjective:  Patient presents with venous ulcer of the Bilateral lower extremity.    Procedure:  3 layer unna wrap was placed Bilateral lower extremity.   Plan:   Follow up in one week.  

## 2018-01-27 ENCOUNTER — Encounter (INDEPENDENT_AMBULATORY_CARE_PROVIDER_SITE_OTHER): Payer: Self-pay

## 2018-01-27 ENCOUNTER — Ambulatory Visit (INDEPENDENT_AMBULATORY_CARE_PROVIDER_SITE_OTHER): Payer: Medicare Other | Admitting: Vascular Surgery

## 2018-01-27 VITALS — BP 115/62 | HR 77 | Resp 16 | Ht 70.0 in | Wt 235.0 lb

## 2018-01-27 DIAGNOSIS — R600 Localized edema: Secondary | ICD-10-CM

## 2018-01-27 DIAGNOSIS — I890 Lymphedema, not elsewhere classified: Secondary | ICD-10-CM

## 2018-01-27 NOTE — Progress Notes (Signed)
History of Present Illness  There is no documented history at this time  Assessments & Plan   There are no diagnoses linked to this encounter.    Additional instructions  Subjective:  Patient presents with venous ulcer of the Bilateral lower extremity.    Procedure:  3 layer unna wrap was placed Bilateral lower extremity.   Plan:   Follow up in one week.  

## 2018-01-30 NOTE — Progress Notes (Signed)
[facility]  [phone] [phone]  ID: Gary [name] OB: 08/07/1946  MR#: [id number]  ZGY#:[id number]  Patient Care Team: [name], MD as PCP - General (Internal Medicine)  CHIEF COMPLAINT: Mantle cell lymphoma.  INTERVAL HISTORY: Patient returns to clinic today for repeat laboratory work, further evaluation, and consideration of cycle 3, day 1 of Rituxan and Treanda.  He continues to have problems with bilateral lower extremity edema that is being addressed by vascular surgery.  He otherwise feels well. He denies any fevers, chills, or night sweats. He has a good appetite and denies weight loss. He has no neurologic complaints. He denies any chest pain or shortness of breath. He has no nausea, vomiting, constipation, or diarrhea.  He denies any melena or hematochezia.  He has no urinary complaints.  Patient offers no further specific complaints today.  REVIEW OF SYSTEMS:   Review of Systems  Constitutional: Negative for diaphoresis, fever, malaise/fatigue and weight loss.  Respiratory: Negative.  Negative for cough and shortness of breath.   Cardiovascular: Positive for leg swelling. Negative for chest pain and palpitations.  Gastrointestinal: Negative.  Negative for abdominal pain, blood in stool and melena.  Genitourinary: Negative.  Negative for hematuria.  Musculoskeletal: Negative.  Negative for falls.  Skin: Negative.  Negative for rash.  Neurological: Negative.  Negative for sensory change, focal weakness and weakness.  Endo/Heme/Allergies: Does not bruise/bleed easily.  Psychiatric/Behavioral: Negative.  The patient is not nervous/anxious.     As per HPI. Otherwise, a complete review of systems is negative.  PAST MEDICAL HISTORY: Past Medical History:  Diagnosis Date  . Depression   . 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 Gastroparesis   . Graves disease   .  Hypercholesterolemia   . Hypertension   . Mantle cell lymphoma ([place])   . Peripheral neuropathy     PAST SURGICAL HISTORY: Past Surgical History:  Procedure Laterality Date  . CHOLECYSTECTOMY    . NEPHRECTOMY     right  . PACEMAKER INSERTION N/A 12/07/2017   Procedure: INSERTION PACEMAKER DUAL CHAMBER INITIAL INSERT;  Surgeon: Isaias Cowman, MD;  Location: [facility];  Service: Cardiovascular;  Laterality: N/A;  . PORTA CATH INSERTION N/A 11/03/2017   Procedure: PORTA CATH INSERTION;  Surgeon: Katha Cabal, MD;  Location: [facility];  Service: Cardiovascular;  Laterality: N/A;    FAMILY HISTORY Family History  Problem Relation Age of Onset  . Breast cancer Mother   . Diabetes Father   . Cancer Father        Lymphoma  . Alcohol abuse Maternal Uncle   . Diabetes Sister   . Heart disease Unknown        grandfather       ADVANCED DIRECTIVES:    HEALTH MAINTENANCE: Social History   Tobacco Use  . Smoking status: Former Research scientist (life sciences)  . Smokeless tobacco: Current User    Types: Chew  Substance Use Topics  . Alcohol use: Yes    Alcohol/week: 1.8 oz    Types: 3 Cans of beer per week    Comment: nightly  . Drug use: No     Colonoscopy:  PAP:  Bone density:  Lipid panel:  Allergies  Allergen Reactions  . Rofecoxib Nausea Only    Current Outpatient Medications  Medication Sig Dispense Refill  . ACCU-CHEK AVIVA PLUS test strip USE AS INSTRUCTED. USE 1 STRIP EVERY 3 HOURS . ACCU-CHECK AVIVA PLUS TEST  STRIP. DX E10.649  12  . acyclovir (ZOVIRAX) 400 MG tablet Take 1 tablet (400 mg total) by mouth daily. 30 tablet 3  . carvedilol (COREG) 3.125 MG tablet Take 3.125 mg by mouth 2 (two) times daily with a meal.     . ferrous sulfate 325 (65 FE) MG tablet Take 325 mg by mouth daily.    . furosemide (LASIX) 20 MG tablet Take 1 tablet (20 mg total) by mouth daily. 30 tablet 1  . insulin aspart (NOVOLOG) 100 UNIT/ML injection Basal rate 12am 0.8 units per hour,  9am 0.9 units per hour. (total basal insulin 20.7 units). Carbohydrate ratio 1 units for every 8gm of carbohydrate. Correction factor 1 units for every 46m/dl over target cbg. Target CBG 80-120 1 vial 12  . levothyroxine (SYNTHROID, LEVOTHROID) 25 MCG tablet Take 25 mcg by mouth daily.    .Marland Kitchenlidocaine-prilocaine (EMLA) cream Apply 1 application topically as needed. Apply to port prior to chemotherapy appointment, cover with plastic wrap. 30 g 2  . lisinopril (PRINIVIL,ZESTRIL) 20 MG tablet Take 20 mg by mouth daily.    . Multiple Vitamin (MULTIVITAMIN) tablet Take 1 tablet by mouth daily.    . naproxen sodium (ALEVE) 220 MG tablet Take 220 mg by mouth daily as needed.    . pravastatin (PRAVACHOL) 40 MG tablet Take 1 tablet (40 mg total) by mouth at bedtime. Will need office visit for any more refills 30 tablet 0  . spironolactone (ALDACTONE) 25 MG tablet Take 25 mg by mouth daily.    .Marland KitchenGLUCAGON EMERGENCY 1 MG injection     . glucose 4 GM chewable tablet Chew 1 tablet by mouth once as needed for low blood sugar.     No current facility-administered medications for this visit.    Facility-Administered Medications Ordered in Other Visits  Medication Dose Route Frequency Provider Last Rate Last Dose  . heparin lock flush 100 unit/mL  500 Units Intravenous Once FLloyd Huger MD      . sodium chloride flush (NS) 0.9 % injection 10 mL  10 mL Intravenous PRN FLloyd Huger MD   10 mL at 02/01/18 [id number] Race: Please given the multiple things to do this would like to do a little bit for myself before do anything  OBJECTIVE: Vitals:   02/01/18 0847  BP: (!) 112/59  Pulse: 67  Resp: 18  Temp: (!) 94.4 F (34.7 C)     Body mass index is 33.5 kg/m.    ECOG FS:0 - Asymptomatic  General: Well-developed, well-nourished, no acute distress. Eyes: Pink conjunctiva, anicteric sclera. Lungs: Clear to auscultation bilaterally. Heart: Regular rate and rhythm. No rubs, murmurs, or  gallops. Abdomen: Soft, nontender, nondistended. No organomegaly noted, normoactive bowel sounds. Musculoskeletal: Bilateral lower extremities with edema, currently in wraps. Neuro: Alert, answering all questions appropriately. Cranial nerves grossly intact. Skin: No rashes or petechiae noted. Psych: Normal affect. Lymphatics: No cervical, calvicular, axillary or inguinal LAD.   LAB RESULTS:  Lab Results  Component Value Date   NA 139 02/01/2018   K 4.1 02/01/2018   CL 112 (H) 02/01/2018   CO2 18 (L) 02/01/2018   GLUCOSE 170 (H) 02/01/2018   BUN 24 (H) 02/01/2018   CREATININE 1.52 (H) 02/01/2018   CALCIUM 8.1 (L) 02/01/2018   PROT 5.7 (L) 02/01/2018   ALBUMIN 3.0 (L) 02/01/2018   AST 21 02/01/2018   ALT 12 (L) 02/01/2018   ALKPHOS 101 02/01/2018   BILITOT 0.9 02/01/2018   GFRNONAA  45 (L) 02/01/2018   GFRAA 52 (L) 02/01/2018    Lab Results  Component Value Date   WBC 3.6 (L) 02/01/2018   NEUTROABS 2.0 02/01/2018   HGB 10.1 (L) 02/01/2018   HCT 28.2 (L) 02/01/2018   MCV 89.9 02/01/2018   PLT 77 (L) 02/01/2018   Lab Results  Component Value Date   IRON 13 (L) 08/11/2017   TIBC 223 (L) 01/12/2017   IRONPCTSAT 3.8 (L) 08/11/2017   Lab Results  Component Value Date   FERRITIN 42.0 08/11/2017     STUDIES:   ASSESSMENT: Low-grade mantle cell lymphoma diagnosed in September 2015, iron deficiency anemia.  PLAN:    1. Mantle cell lymphoma, stage III: Patient's initial diagnosis was on inguinal lymph node biopsy on July 12, 2014.  CT scan results from January 2019 reviewed independently with clear evidence of progression of disease. Patient declined bone marrow biopsy to complete the staging workup.  Although, this would not change his overall treatment plan.  Patient received cycle 1, day 1 of Rituxan and Treanda on January 29 and January 30, but then cycle 2 was delayed secondary to symptomatic bradycardia requiring pacemaker.  Restaging CT scan on January 03, 2018  revealed improvement of disease burden after just one cycle.  Delay cycle 3, day 1 of Rituxan and Treanda today secondary to thrombocytopenia.  Return to clinic in 1 week for repeat laboratory work and reconsideration of cycle 3, day 1.  2. Decreased ejection fraction: Chronic and unchanged.  Previously patient had an ejection fraction of 40% with no clinical CHF.  Continue monitoring and evaluation per cardiology. 3. Diabetes: Continue insulin pump as directed. 4. Thrombocytopenia: Delay treatment as above.  5. Hypertension: Continue treatment and evaluation per PCP. 6.  Iron deficiency anemia: Patient's hemoglobin has trended down slightly. Patient last received IV Feraheme on September 28, 2017.  Will consider additional IV Feraheme in the future if necessary.   7.  Bradycardia: Patient has pacemaker in place. 8.  Renal insufficiency: Patient's creatinine has trended up slightly, monitor.  Approximately 30 minutes was spent in discussion of which greater than 50% was consultation.   Patient expressed understanding and was in agreement with this plan. He also understands that He can call clinic at any time with any questions, concerns, or complaints.    Lloyd Huger, MD   02/04/2018 10:22 AM

## 2018-02-01 ENCOUNTER — Encounter: Payer: Self-pay | Admitting: Oncology

## 2018-02-01 ENCOUNTER — Inpatient Hospital Stay: Payer: Medicare Other | Attending: Oncology

## 2018-02-01 ENCOUNTER — Inpatient Hospital Stay (HOSPITAL_BASED_OUTPATIENT_CLINIC_OR_DEPARTMENT_OTHER): Payer: Medicare Other | Admitting: Oncology

## 2018-02-01 ENCOUNTER — Inpatient Hospital Stay: Payer: Medicare Other

## 2018-02-01 ENCOUNTER — Other Ambulatory Visit: Payer: Self-pay

## 2018-02-01 VITALS — BP 112/59 | HR 67 | Temp 94.4°F | Resp 18 | Wt 233.5 lb

## 2018-02-01 DIAGNOSIS — Z803 Family history of malignant neoplasm of breast: Secondary | ICD-10-CM | POA: Diagnosis not present

## 2018-02-01 DIAGNOSIS — Z950 Presence of cardiac pacemaker: Secondary | ICD-10-CM | POA: Insufficient documentation

## 2018-02-01 DIAGNOSIS — Z5112 Encounter for antineoplastic immunotherapy: Secondary | ICD-10-CM | POA: Diagnosis not present

## 2018-02-01 DIAGNOSIS — I10 Essential (primary) hypertension: Secondary | ICD-10-CM | POA: Diagnosis not present

## 2018-02-01 DIAGNOSIS — Z87891 Personal history of nicotine dependence: Secondary | ICD-10-CM | POA: Diagnosis not present

## 2018-02-01 DIAGNOSIS — C8318 Mantle cell lymphoma, lymph nodes of multiple sites: Secondary | ICD-10-CM | POA: Insufficient documentation

## 2018-02-01 DIAGNOSIS — R600 Localized edema: Secondary | ICD-10-CM | POA: Diagnosis not present

## 2018-02-01 DIAGNOSIS — D6959 Other secondary thrombocytopenia: Secondary | ICD-10-CM | POA: Diagnosis not present

## 2018-02-01 DIAGNOSIS — E1143 Type 2 diabetes mellitus with diabetic autonomic (poly)neuropathy: Secondary | ICD-10-CM | POA: Diagnosis not present

## 2018-02-01 DIAGNOSIS — Z807 Family history of other malignant neoplasms of lymphoid, hematopoietic and related tissues: Secondary | ICD-10-CM | POA: Insufficient documentation

## 2018-02-01 DIAGNOSIS — N289 Disorder of kidney and ureter, unspecified: Secondary | ICD-10-CM | POA: Diagnosis not present

## 2018-02-01 DIAGNOSIS — I2510 Atherosclerotic heart disease of native coronary artery without angina pectoris: Secondary | ICD-10-CM | POA: Insufficient documentation

## 2018-02-01 DIAGNOSIS — E7800 Pure hypercholesterolemia, unspecified: Secondary | ICD-10-CM

## 2018-02-01 DIAGNOSIS — E0500 Thyrotoxicosis with diffuse goiter without thyrotoxic crisis or storm: Secondary | ICD-10-CM | POA: Insufficient documentation

## 2018-02-01 DIAGNOSIS — Z5111 Encounter for antineoplastic chemotherapy: Secondary | ICD-10-CM | POA: Diagnosis not present

## 2018-02-01 DIAGNOSIS — T451X5S Adverse effect of antineoplastic and immunosuppressive drugs, sequela: Secondary | ICD-10-CM

## 2018-02-01 DIAGNOSIS — D509 Iron deficiency anemia, unspecified: Secondary | ICD-10-CM | POA: Insufficient documentation

## 2018-02-01 DIAGNOSIS — Z794 Long term (current) use of insulin: Secondary | ICD-10-CM | POA: Diagnosis not present

## 2018-02-01 DIAGNOSIS — E11311 Type 2 diabetes mellitus with unspecified diabetic retinopathy with macular edema: Secondary | ICD-10-CM | POA: Insufficient documentation

## 2018-02-01 DIAGNOSIS — Z79899 Other long term (current) drug therapy: Secondary | ICD-10-CM

## 2018-02-01 LAB — CBC WITH DIFFERENTIAL/PLATELET
BASOS ABS: 0.1 10*3/uL (ref 0–0.1)
BASOS PCT: 2 %
Eosinophils Absolute: 0.1 10*3/uL (ref 0–0.7)
Eosinophils Relative: 4 %
HCT: 28.2 % — ABNORMAL LOW (ref 40.0–52.0)
HEMOGLOBIN: 10.1 g/dL — AB (ref 13.0–18.0)
LYMPHS PCT: 24 %
Lymphs Abs: 0.9 10*3/uL — ABNORMAL LOW (ref 1.0–3.6)
MCH: 32.1 pg (ref 26.0–34.0)
MCHC: 35.7 g/dL (ref 32.0–36.0)
MCV: 89.9 fL (ref 80.0–100.0)
MONO ABS: 0.5 10*3/uL (ref 0.2–1.0)
MONOS PCT: 15 %
NEUTROS ABS: 2 10*3/uL (ref 1.4–6.5)
NEUTROS PCT: 55 %
Platelets: 77 10*3/uL — ABNORMAL LOW (ref 150–440)
RBC: 3.14 MIL/uL — ABNORMAL LOW (ref 4.40–5.90)
RDW: 16.2 % — ABNORMAL HIGH (ref 11.5–14.5)
WBC: 3.6 10*3/uL — ABNORMAL LOW (ref 3.8–10.6)

## 2018-02-01 LAB — COMPREHENSIVE METABOLIC PANEL
ALBUMIN: 3 g/dL — AB (ref 3.5–5.0)
ALK PHOS: 101 U/L (ref 38–126)
ALT: 12 U/L — ABNORMAL LOW (ref 17–63)
ANION GAP: 9 (ref 5–15)
AST: 21 U/L (ref 15–41)
BUN: 24 mg/dL — ABNORMAL HIGH (ref 6–20)
CALCIUM: 8.1 mg/dL — AB (ref 8.9–10.3)
CO2: 18 mmol/L — AB (ref 22–32)
Chloride: 112 mmol/L — ABNORMAL HIGH (ref 101–111)
Creatinine, Ser: 1.52 mg/dL — ABNORMAL HIGH (ref 0.61–1.24)
GFR calc non Af Amer: 45 mL/min — ABNORMAL LOW (ref >60)
GFR, EST AFRICAN AMERICAN: 52 mL/min — AB (ref >60)
GLUCOSE: 170 mg/dL — AB (ref 65–99)
Potassium: 4.1 mmol/L (ref 3.5–5.1)
SODIUM: 139 mmol/L (ref 135–145)
Total Bilirubin: 0.9 mg/dL (ref 0.3–1.2)
Total Protein: 5.7 g/dL — ABNORMAL LOW (ref 6.5–8.1)

## 2018-02-01 MED ORDER — HEPARIN SOD (PORK) LOCK FLUSH 100 UNIT/ML IV SOLN: 500.0000 [IU] | Freq: Once | INTRAVENOUS | Status: AC

## 2018-02-01 MED ORDER — SODIUM CHLORIDE 0.9% FLUSH
10.0000 mL | INTRAVENOUS | Status: AC | PRN
  Administered 2018-02-01: 10 mL via INTRAVENOUS
  Filled 2018-02-01: qty 10

## 2018-02-01 NOTE — Progress Notes (Signed)
Pt here for follow up. Stated mild pain esp r  ankle area described weeping areas not sure dx. Stated " vein Dr Kelly Splinter them " no new voiced  c/o this am

## 2018-02-02 ENCOUNTER — Ambulatory Visit (INDEPENDENT_AMBULATORY_CARE_PROVIDER_SITE_OTHER): Payer: Medicare Other | Admitting: Internal Medicine

## 2018-02-02 ENCOUNTER — Ambulatory Visit: Payer: Medicare Other | Admitting: Oncology

## 2018-02-02 ENCOUNTER — Ambulatory Visit: Payer: Medicare Other

## 2018-02-02 ENCOUNTER — Other Ambulatory Visit: Payer: Medicare Other

## 2018-02-02 DIAGNOSIS — I2510 Atherosclerotic heart disease of native coronary artery without angina pectoris: Secondary | ICD-10-CM | POA: Diagnosis not present

## 2018-02-02 DIAGNOSIS — E1151 Type 2 diabetes mellitus with diabetic peripheral angiopathy without gangrene: Secondary | ICD-10-CM

## 2018-02-02 DIAGNOSIS — E079 Disorder of thyroid, unspecified: Secondary | ICD-10-CM

## 2018-02-02 DIAGNOSIS — D649 Anemia, unspecified: Secondary | ICD-10-CM | POA: Diagnosis not present

## 2018-02-02 DIAGNOSIS — I890 Lymphedema, not elsewhere classified: Secondary | ICD-10-CM

## 2018-02-02 DIAGNOSIS — C8318 Mantle cell lymphoma, lymph nodes of multiple sites: Secondary | ICD-10-CM | POA: Diagnosis not present

## 2018-02-02 DIAGNOSIS — I5022 Chronic systolic (congestive) heart failure: Secondary | ICD-10-CM | POA: Diagnosis not present

## 2018-02-02 DIAGNOSIS — F4321 Adjustment disorder with depressed mood: Secondary | ICD-10-CM | POA: Diagnosis not present

## 2018-02-02 DIAGNOSIS — R600 Localized edema: Secondary | ICD-10-CM | POA: Diagnosis not present

## 2018-02-02 DIAGNOSIS — I10 Essential (primary) hypertension: Secondary | ICD-10-CM

## 2018-02-02 DIAGNOSIS — E7800 Pure hypercholesterolemia, unspecified: Secondary | ICD-10-CM

## 2018-02-02 NOTE — Progress Notes (Signed)
[name], male   DOB: 10-30-1945, 72 y.o.   MRN: [id number]   Subjective:    [name], male    DOB: [birth year], 72 y.o.   MRN: [id number]  HPI  Patient here for a scheduled follow up.  States he is doing better.  Seeing Dr Gary [name] for his diabetes.  Has insulin pump.  No significant problems with low sugars recently.  Discussed diet and exercise.  Discussed importance of not over shooting - his insulin.  No chest pain.  Breathing stable.  Seeing vascular surgery for his lower extremity swelling/lymphedema.  Legs wrapped.  Doing better.  Seeing Dr Gary [name] - for lymphoma.  Receiving chemo.  Also seeing cardiology.  S/p pacemaker placement.  Doing well.  No nausea or vomiting.  Bowels moving.     Past Medical History:  Diagnosis Date  . Depression   . 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 Gastroparesis   . Graves disease   . Hypercholesterolemia   . Hypertension   . Mantle cell lymphoma ([place])   . Peripheral neuropathy    Past Surgical History:  Procedure Laterality Date  . CHOLECYSTECTOMY    . NEPHRECTOMY     right  . PACEMAKER INSERTION N/A 12/07/2017   Procedure: INSERTION PACEMAKER DUAL CHAMBER INITIAL INSERT;  Surgeon: Isaias Cowman, MD;  Location: [facility];  Service: Cardiovascular;  Laterality: N/A;  . PORTA CATH INSERTION N/A 11/03/2017   Procedure: PORTA CATH INSERTION;  Surgeon: Katha Cabal, MD;  Location: [facility];  Service: Cardiovascular;  Laterality: N/A;   Family History  Problem Relation Age of Onset  . Breast cancer Mother   . Diabetes Father   . Cancer Father        Lymphoma  . Alcohol abuse Maternal Uncle   . Diabetes Sister   . Heart disease Unknown        grandfather   Social History   Socioeconomic History  . Marital status: Married    Spouse name: Not on file  . Number of children: 1  . Years of education: Not on file  . Highest education  level: Not on file  Occupational History  . Not on file  Social Needs  . Financial resource strain: Not on file  . Food insecurity:    Worry: Not on file    Inability: Not on file  . Transportation needs:    Medical: Not on file    Non-medical: Not on file  Tobacco Use  . Smoking status: Former Research scientist (life sciences)  . Smokeless tobacco: Current User    Types: Chew  Substance and Sexual Activity  . Alcohol use: Yes    Alcohol/week: 1.8 oz    Types: 3 Cans of beer per week    Comment: nightly  . Drug use: No  . Sexual activity: Not Currently  Lifestyle  . Physical activity:    Days per week: Not on file    Minutes per session: Not on file  . Stress: Not on file  Relationships  . Social connections:    Talks on phone: Not on file    Gets together: Not on file    Attends religious service: Not on file    Active member of club or organization: Not on file    Attends meetings of clubs or organizations: Not on file    Relationship status: Not on file  Other Topics Concern  .  Not on file  Social History Narrative  . Not on file    Outpatient Encounter Medications as of 02/02/2018  Medication Sig  . ACCU-CHEK AVIVA PLUS test strip USE AS INSTRUCTED. USE 1 STRIP EVERY 3 HOURS . ACCU-CHECK AVIVA PLUS TEST STRIP. DX H96.222  . acyclovir (ZOVIRAX) 400 MG tablet Take 1 tablet (400 mg total) by mouth daily.  . carvedilol (COREG) 3.125 MG tablet Take 3.125 mg by mouth 2 (two) times daily with a meal.   . ferrous sulfate 325 (65 FE) MG tablet Take 325 mg by mouth daily.  . furosemide (LASIX) 20 MG tablet Take 1 tablet (20 mg total) by mouth daily.  Marland Kitchen GLUCAGON EMERGENCY 1 MG injection   . glucose 4 GM chewable tablet Chew 1 tablet by mouth once as needed for low blood sugar.  . insulin aspart (NOVOLOG) 100 UNIT/ML injection Basal rate 12am 0.8 units per hour, 9am 0.9 units per hour. (total basal insulin 20.7 units). Carbohydrate ratio 1 units for every 8gm of carbohydrate. Correction factor 1 units  for every 50mg /dl over target cbg. Target CBG 80-120  . levothyroxine (SYNTHROID, LEVOTHROID) 25 MCG tablet Take 25 mcg by mouth daily.  Marland Kitchen lidocaine-prilocaine (EMLA) cream Apply 1 application topically as needed. Apply to port prior to chemotherapy appointment, cover with plastic wrap.  . lisinopril (PRINIVIL,ZESTRIL) 20 MG tablet Take 20 mg by mouth daily.  . Multiple Vitamin (MULTIVITAMIN) tablet Take 1 tablet by mouth daily.  . naproxen sodium (ALEVE) 220 MG tablet Take 220 mg by mouth daily as needed.  . pravastatin (PRAVACHOL) 40 MG tablet Take 1 tablet (40 mg total) by mouth at bedtime. Will need office visit for any more refills  . spironolactone (ALDACTONE) 25 MG tablet Take 25 mg by mouth daily.  . [DISCONTINUED] ondansetron (ZOFRAN) 8 MG tablet Take 1 tablet (8 mg total) by mouth 2 (two) times daily as needed for refractory nausea / vomiting. (Patient not taking: Reported on 02/01/2018)  . [DISCONTINUED] pantoprazole (PROTONIX) 40 MG tablet TAKE 1 TABLET (40 MG TOTAL) BY MOUTH DAILY. (Patient not taking: Reported on 02/01/2018)  . [DISCONTINUED] prochlorperazine (COMPAZINE) 10 MG tablet TAKE 1 TABLET (10 MG TOTAL) BY MOUTH EVERY 6 (SIX) HOURS AS NEEDED (NAUSEA OR VOMITING). (Patient not taking: Reported on 02/01/2018)   Facility-Administered Encounter Medications as of 02/02/2018  Medication  . heparin lock flush 100 unit/mL  . sodium chloride flush (NS) 0.9 % injection 10 mL    Review of Systems  Constitutional: Negative for appetite change and unexpected weight change.  HENT: Negative for congestion and sinus pressure.   Respiratory: Negative for cough, chest tightness and shortness of breath.   Cardiovascular: Positive for leg swelling. Negative for chest pain and palpitations.       Swelling improved since seeing vascular surgery.  Wrapped.   Gastrointestinal: Negative for abdominal pain, diarrhea, nausea and vomiting.  Genitourinary: Negative for difficulty urinating and dysuria.   Musculoskeletal: Negative for joint swelling and myalgias.  Skin: Negative for color change and rash.  Neurological: Negative for dizziness, light-headedness and headaches.  Psychiatric/Behavioral: Negative for agitation and dysphoric mood.       Objective:    Physical Exam  Constitutional: He appears well-developed and well-nourished. No distress.  HENT:  Nose: Nose normal.  Mouth/Throat: Oropharynx is clear and moist.  Neck: Neck supple. No thyromegaly present.  Cardiovascular: Normal rate and regular rhythm.  Pulmonary/Chest: Effort normal and breath sounds normal. No respiratory distress.  Abdominal: Soft. Bowel sounds are  normal. There is no tenderness.  Musculoskeletal:  Legs wrapped.  Swelling improved.    Lymphadenopathy:    He has no cervical adenopathy.  Skin: No erythema. No pallor.  Psychiatric: He has a normal mood and affect. His behavior is normal.    BP 128/62 (BP Location: Left Arm, Patient Position: Sitting, Cuff Size: Normal)   Pulse 78   Temp 98.7 F (37.1 C) (Oral)   Resp 18   Wt 234 lb 6.4 oz (106.3 kg)   SpO2 98%   BMI 33.63 kg/m  Wt Readings from Last 3 Encounters:  02/04/18 232 lb 9.6 oz (105.5 kg)  02/02/18 234 lb 6.4 oz (106.3 kg)  02/01/18 233 lb 8 oz (105.9 kg)     Lab Results  Component Value Date   WBC 3.6 (L) 02/01/2018   HGB 10.1 (L) 02/01/2018   HCT 28.2 (L) 02/01/2018   PLT 77 (L) 02/01/2018   GLUCOSE 170 (H) 02/01/2018   CHOL 197 08/09/2015   TRIG 51.0 08/09/2015   HDL 73.60 08/09/2015   LDLCALC 113 (H) 08/09/2015   ALT 12 (L) 02/01/2018   AST 21 02/01/2018   NA 139 02/01/2018   K 4.1 02/01/2018   CL 112 (H) 02/01/2018   CREATININE 1.52 (H) 02/01/2018   BUN 24 (H) 02/01/2018   CO2 18 (L) 02/01/2018   TSH 3.894 10/28/2017   INR 1.22 12/02/2017   HGBA1C 5.6 10/28/2017   MICROALBUR 1.9 09/22/2016    Ct Chest W Contrast  Result Date: 01/03/2018 CLINICAL DATA:  Patient with history of mantle cell lymphoma. Restaging  evaluation. EXAM: CT CHEST, ABDOMEN, AND PELVIS WITH CONTRAST TECHNIQUE: Multidetector CT imaging of the chest, abdomen and pelvis was performed following the standard protocol during bolus administration of intravenous contrast. CONTRAST:  145mL ISOVUE-300 IOPAMIDOL (ISOVUE-300) INJECTION 61% COMPARISON:  CT CAP 10/19/2017. FINDINGS: CT CHEST FINDINGS Cardiovascular: Right anterior chest wall is present with tip terminating in the superior vena cava. Normal heart size. Trace fluid superior pericardial recess. Coronary arterial vascular calcifications. Thoracic aortic vascular calcifications. Mediastinum/Nodes: Interval decrease in size of axillary and mediastinal adenopathy. Reference 0.9 cm precarinal lymph node (image 25; series 2), previously 1.3 cm. Reference 1.1 cm right hilar lymph node (image 28; series 2), previously 1.9 cm. Reference 0.7 cm right axillary lymph node (image 19; series 2), previously 1.3 cm. Lungs/Pleura: Central airways are patent. Dependent atelectasis within the bilateral lower lobes. Centrilobular and paraseptal emphysematous changes. Stable 5 mm right upper lobe nodule (image 52; series 3). Stable 5 mm left upper lobe nodule (image 44; series 3). New 3 mm right lower lobe nodule (image 104; series 3). No pleural effusion or pneumothorax. Musculoskeletal: Chronic deformity right humerus. Healing posterior left 9, 10 and eleventh rib fractures. CT ABDOMEN PELVIS FINDINGS Hepatobiliary: Liver is normal in size and contour. Gallbladder surgically absent. No intrahepatic or extrahepatic biliary ductal dilatation. Pancreas: Unremarkable Spleen: Unremarkable Adrenals/Urinary Tract: Adrenal glands are normal. The left kidney enhances appropriately with contrast. The right kidney is absent. Urinary bladder is unremarkable. Stable 1.2 cm exophytic cyst inferior pole left kidney. Stomach/Bowel: No abnormal bowel wall thickening or evidence for bowel obstruction. No free fluid or free  intraperitoneal air. Normal morphology of the stomach. Vascular/Lymphatic: Normal caliber abdominal aorta. Peripheral calcified atherosclerotic plaque. Interval decrease in size of retroperitoneal, pelvic and inguinal lymph nodes. Reference left periaortic lymph node measures 2.4 cm (image 90; series 2), previously 3.9 cm. Reference left external iliac lymph node measures 2.3 cm (image 103; series  2), previously 3.9 cm. Reference right inguinal lymph node measures 1.8 cm (image 131; series 2), previously 2.8 cm. Reproductive: Prostate unremarkable. Other: Small fat containing left inguinal hernia. Musculoskeletal: Lumbar spine degenerative changes. No aggressive or acute appearing osseous lesions. IMPRESSION: Interval decrease in size of adenopathy throughout the chest, abdomen and pelvis. Stable pulmonary upper lobe pulmonary nodules. New right lower lobe pulmonary nodule. Recommend attention on follow-up. Re demonstrated comminuted proximal right humerus fracture. Electronically Signed   By: Lovey Newcomer M.D.   On: 01/03/2018 13:54   Ct Abdomen Pelvis W Contrast  Result Date: 01/03/2018 CLINICAL DATA:  Patient with history of mantle cell lymphoma. Restaging evaluation. EXAM: CT CHEST, ABDOMEN, AND PELVIS WITH CONTRAST TECHNIQUE: Multidetector CT imaging of the chest, abdomen and pelvis was performed following the standard protocol during bolus administration of intravenous contrast. CONTRAST:  155mL ISOVUE-300 IOPAMIDOL (ISOVUE-300) INJECTION 61% COMPARISON:  CT CAP 10/19/2017. FINDINGS: CT CHEST FINDINGS Cardiovascular: Right anterior chest wall is present with tip terminating in the superior vena cava. Normal heart size. Trace fluid superior pericardial recess. Coronary arterial vascular calcifications. Thoracic aortic vascular calcifications. Mediastinum/Nodes: Interval decrease in size of axillary and mediastinal adenopathy. Reference 0.9 cm precarinal lymph node (image 25; series 2), previously 1.3 cm.  Reference 1.1 cm right hilar lymph node (image 28; series 2), previously 1.9 cm. Reference 0.7 cm right axillary lymph node (image 19; series 2), previously 1.3 cm. Lungs/Pleura: Central airways are patent. Dependent atelectasis within the bilateral lower lobes. Centrilobular and paraseptal emphysematous changes. Stable 5 mm right upper lobe nodule (image 52; series 3). Stable 5 mm left upper lobe nodule (image 44; series 3). New 3 mm right lower lobe nodule (image 104; series 3). No pleural effusion or pneumothorax. Musculoskeletal: Chronic deformity right humerus. Healing posterior left 9, 10 and eleventh rib fractures. CT ABDOMEN PELVIS FINDINGS Hepatobiliary: Liver is normal in size and contour. Gallbladder surgically absent. No intrahepatic or extrahepatic biliary ductal dilatation. Pancreas: Unremarkable Spleen: Unremarkable Adrenals/Urinary Tract: Adrenal glands are normal. The left kidney enhances appropriately with contrast. The right kidney is absent. Urinary bladder is unremarkable. Stable 1.2 cm exophytic cyst inferior pole left kidney. Stomach/Bowel: No abnormal bowel wall thickening or evidence for bowel obstruction. No free fluid or free intraperitoneal air. Normal morphology of the stomach. Vascular/Lymphatic: Normal caliber abdominal aorta. Peripheral calcified atherosclerotic plaque. Interval decrease in size of retroperitoneal, pelvic and inguinal lymph nodes. Reference left periaortic lymph node measures 2.4 cm (image 90; series 2), previously 3.9 cm. Reference left external iliac lymph node measures 2.3 cm (image 103; series 2), previously 3.9 cm. Reference right inguinal lymph node measures 1.8 cm (image 131; series 2), previously 2.8 cm. Reproductive: Prostate unremarkable. Other: Small fat containing left inguinal hernia. Musculoskeletal: Lumbar spine degenerative changes. No aggressive or acute appearing osseous lesions. IMPRESSION: Interval decrease in size of adenopathy throughout the  chest, abdomen and pelvis. Stable pulmonary upper lobe pulmonary nodules. New right lower lobe pulmonary nodule. Recommend attention on follow-up. Re demonstrated comminuted proximal right humerus fracture. Electronically Signed   By: Lovey Newcomer M.D.   On: 01/03/2018 13:54       Assessment & Plan:   Problem List Items Addressed This Visit    Adjustment disorder with depressed mood    Stable.       Anemia    Being followed by hematology/oncology.        Bilateral leg edema    Doing better.  Wrapped.  Followed by vascular surgery.  CAD in native artery    Followed by cardiology.  Continue risk factor modification.        Chronic systolic CHF (congestive heart failure) (HCC)    Lungs clear.  Breathing stable.  Followed by cardiology.       Diabetes ([place])    Followed by Dr Gary [name].  Has insulin pump.  No significant low sugars.  Follow.       Essential hypertension, benign    Blood pressure under good control.  Continue same medication regimen.  Follow pressures.  Follow metabolic panel.        Hypercholesterolemia    On pravastatin.  Low cholesterol diet and exercise.  Follow lipid panel and liver function tests.        Lymphedema    Seeing vascular surgery.  Legs wrapped.  Swelling better.  Follow.       Mantle cell lymphoma ([place])    Undergoing treatment.  Seeing oncology.        Thyroid disease    Followed by Dr Gary [name].           [name], MD

## 2018-02-04 ENCOUNTER — Encounter (INDEPENDENT_AMBULATORY_CARE_PROVIDER_SITE_OTHER): Payer: Medicare Other

## 2018-02-04 ENCOUNTER — Ambulatory Visit (INDEPENDENT_AMBULATORY_CARE_PROVIDER_SITE_OTHER): Payer: Medicare Other

## 2018-02-04 ENCOUNTER — Encounter (INDEPENDENT_AMBULATORY_CARE_PROVIDER_SITE_OTHER): Payer: Self-pay | Admitting: Vascular Surgery

## 2018-02-04 ENCOUNTER — Ambulatory Visit (INDEPENDENT_AMBULATORY_CARE_PROVIDER_SITE_OTHER): Payer: Medicare Other | Admitting: Vascular Surgery

## 2018-02-04 VITALS — BP 129/64 | HR 76 | Resp 16 | Ht 70.0 in | Wt 232.6 lb

## 2018-02-04 DIAGNOSIS — I2510 Atherosclerotic heart disease of native coronary artery without angina pectoris: Secondary | ICD-10-CM | POA: Diagnosis not present

## 2018-02-04 DIAGNOSIS — I890 Lymphedema, not elsewhere classified: Secondary | ICD-10-CM

## 2018-02-04 DIAGNOSIS — I5022 Chronic systolic (congestive) heart failure: Secondary | ICD-10-CM

## 2018-02-04 DIAGNOSIS — R600 Localized edema: Secondary | ICD-10-CM

## 2018-02-04 DIAGNOSIS — C8318 Mantle cell lymphoma, lymph nodes of multiple sites: Secondary | ICD-10-CM

## 2018-02-04 NOTE — Progress Notes (Signed)
Subjective:    [name], male    DOB: [birth year], 72 y.o.   MRN: [id number] Chief Complaint  Patient presents with  . Follow-up    pt conv bil ven reflux   Patient presents for a monthly lymphedema exacerbation / wound follow-up.  The patient is also here to review vascular studies.  Over the last 4 weeks, the patient has been undergoing three layer of zinc oxide unna wraps to the bilateral legs in an effort to control his lymphedema exacerbation and help heal his wounds.  The patient underwent a lower extremity venous reflux study back in January 2019 however the study was difficult due to the amount of edema in the patient's legs.  The patient has been wearing his unna boots however he has not been elevating his legs or using his lymphedema pump as he states he was told by Dr. Lucky Cowboy not to?  The patient underwent a bilateral lower extremity venous reflux study which was notable for normal reflux times in the superficial and deep venous system.  The patient had notes an improvement in his edema and that his ulcerations are healed.  The patient denies any fever, nausea or vomiting.  Review of Systems  Constitutional: Negative.   HENT: Negative.   Eyes: Negative.   Respiratory: Negative.   Cardiovascular: Positive for leg swelling.  Gastrointestinal: Negative.   Endocrine: Negative.   Genitourinary: Negative.   Musculoskeletal: Negative.   Skin: Positive for wound.  Allergic/Immunologic: Negative.   Neurological: Negative.   Hematological: Negative.   Psychiatric/Behavioral: Negative.       Objective:   Physical Exam  Constitutional: He is oriented to person, place, and time. He appears well-developed and well-nourished. No distress.  HENT:  Head: Normocephalic and atraumatic.  Right Ear: External ear normal.  Left Ear: External ear normal.  Eyes: Pupils are equal, round, and reactive to light. Conjunctivae and EOM are normal.  Neck: Normal range of motion.    Cardiovascular: Normal rate, regular rhythm, normal heart sounds and intact distal pulses.  Pulses:      Radial pulses are 2+ on the right side, and 2+ on the left side.  Hard to palpate pedal pulses however the bilateral feet are warm  Pulmonary/Chest: Effort normal.  Musculoskeletal: Normal range of motion. He exhibits edema.  Patient with improved edema to the bilateral lower extremity however still has mild to moderate 1+ pitting edema.  Neurological: He is alert and oriented to person, place, and time.  Skin: Skin is warm and dry. He is not diaphoretic.  Ulcerations have healed.  There is no active cellulitis of the bilateral lower extremity.  Psychiatric: He has a normal mood and affect. His behavior is normal. Judgment and thought content normal.  Vitals reviewed.  BP 129/64 (BP Location: Right Arm)   Pulse 76   Resp 16   Ht 5\' 10"  (1.778 m)   Wt 232 lb 9.6 oz (105.5 kg)   BMI 33.37 kg/m   Past Medical History:  Diagnosis Date  . Depression   . 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 Gastroparesis   . Graves disease   . Hypercholesterolemia   . Hypertension   . Mantle cell lymphoma ([place])   . Peripheral neuropathy    Social History   Socioeconomic History  . Marital status: Married    Spouse name: Not on file  . Number of children: 1  . Years of  education: Not on file  . Highest education level: Not on file  Occupational History  . Not on file  Social Needs  . Financial resource strain: Not on file  . Food insecurity:    Worry: Not on file    Inability: Not on file  . Transportation needs:    Medical: Not on file    Non-medical: Not on file  Tobacco Use  . Smoking status: Former Research scientist (life sciences)  . Smokeless tobacco: Current User    Types: Chew  Substance and Sexual Activity  . Alcohol use: Yes    Alcohol/week: 1.8 oz    Types: 3 Cans of beer per week    Comment: nightly  . Drug use: No  . Sexual  activity: Not Currently  Lifestyle  . Physical activity:    Days per week: Not on file    Minutes per session: Not on file  . Stress: Not on file  Relationships  . Social connections:    Talks on phone: Not on file    Gets together: Not on file    Attends religious service: Not on file    Active member of club or organization: Not on file    Attends meetings of clubs or organizations: Not on file    Relationship status: Not on file  . Intimate partner violence:    Fear of current or ex partner: Not on file    Emotionally abused: Not on file    Physically abused: Not on file    Forced sexual activity: Not on file  Other Topics Concern  . Not on file  Social History Narrative  . Not on file   Past Surgical History:  Procedure Laterality Date  . CHOLECYSTECTOMY    . NEPHRECTOMY     right  . PACEMAKER INSERTION N/A 12/07/2017   Procedure: INSERTION PACEMAKER DUAL CHAMBER INITIAL INSERT;  Surgeon: Isaias Cowman, MD;  Location: [facility];  Service: Cardiovascular;  Laterality: N/A;  . PORTA CATH INSERTION N/A 11/03/2017   Procedure: PORTA CATH INSERTION;  Surgeon: Katha Cabal, MD;  Location: [facility];  Service: Cardiovascular;  Laterality: N/A;   Family History  Problem Relation Age of Onset  . Breast cancer Mother   . Diabetes Father   . Cancer Father        Lymphoma  . Alcohol abuse Maternal Uncle   . Diabetes Sister   . Heart disease Unknown        grandfather   Allergies  Allergen Reactions  . Rofecoxib Nausea Only      Assessment & Plan:  Patient presents for a monthly lymphedema exacerbation / wound follow-up.  The patient is also here to review vascular studies.  Over the last 4 weeks, the patient has been undergoing three layer of zinc oxide unna wraps to the bilateral legs in an effort to control his lymphedema exacerbation and help heal his wounds.  The patient underwent a lower extremity venous reflux study back in January 2019 however  the study was difficult due to the amount of edema in the patient's legs.  The patient has been wearing his unna boots however he has not been elevating his legs or using his lymphedema pump as he states he was told by Dr. Lucky Cowboy not to?  The patient underwent a bilateral lower extremity venous reflux study which was notable for normal reflux times in the superficial and deep venous system.  The patient had notes an improvement in his edema and that  his ulcerations are healed.  The patient denies any fever, nausea or vomiting.  1. Chronic systolic CHF (congestive heart failure) ([place]) - Stable This is a contributing factor to the patient's bilateral lower extremity edema Is followed by the patient's cardiologist and primary care physician  2. Mantle cell lymphoma of lymph nodes of multiple regions ([place]) - Stable This is another contributing factor to the patient's bilateral lower extremity edema Followed by the patient's oncologist  3. Lymphedema - Stable Recommend another 4 weeks of 3 layer of zinc oxide and wraps to the bilateral lower extremity in an effort to continue to control the patient's edema and avoid any recurrent formation of his ulcerations. The patient's ulcerations are now healed he should start using his lymphedema pump at least one time a day for an hour this week he should increase it to 2 times a day for an hour each time next week. The patient was encouraged to elevate his legs heart level or higher as much as possible The patient is to follow-up in 1 month If the patient's edema is controlled and his skin is healthy I will try to transition him back into compression socks in 1 month  Current Outpatient Medications on File Prior to Visit  Medication Sig Dispense Refill  . ACCU-CHEK AVIVA PLUS test strip USE AS INSTRUCTED. USE 1 STRIP EVERY 3 HOURS . ACCU-CHECK AVIVA PLUS TEST STRIP. DX E10.649  12  . acyclovir (ZOVIRAX) 400 MG tablet Take 1 tablet (400 mg total) by mouth daily.  30 tablet 3  . carvedilol (COREG) 3.125 MG tablet Take 3.125 mg by mouth 2 (two) times daily with a meal.     . ferrous sulfate 325 (65 FE) MG tablet Take 325 mg by mouth daily.    . furosemide (LASIX) 20 MG tablet Take 1 tablet (20 mg total) by mouth daily. 30 tablet 1  . GLUCAGON EMERGENCY 1 MG injection     . glucose 4 GM chewable tablet Chew 1 tablet by mouth once as needed for low blood sugar.    . insulin aspart (NOVOLOG) 100 UNIT/ML injection Basal rate 12am 0.8 units per hour, 9am 0.9 units per hour. (total basal insulin 20.7 units). Carbohydrate ratio 1 units for every 8gm of carbohydrate. Correction factor 1 units for every 50mg /dl over target cbg. Target CBG 80-120 1 vial 12  . levothyroxine (SYNTHROID, LEVOTHROID) 25 MCG tablet Take 25 mcg by mouth daily.    Marland Kitchen lidocaine-prilocaine (EMLA) cream Apply 1 application topically as needed. Apply to port prior to chemotherapy appointment, cover with plastic wrap. 30 g 2  . lisinopril (PRINIVIL,ZESTRIL) 20 MG tablet Take 20 mg by mouth daily.    . Multiple Vitamin (MULTIVITAMIN) tablet Take 1 tablet by mouth daily.    . naproxen sodium (ALEVE) 220 MG tablet Take 220 mg by mouth daily as needed.    . pravastatin (PRAVACHOL) 40 MG tablet Take 1 tablet (40 mg total) by mouth at bedtime. Will need office visit for any more refills 30 tablet 0  . spironolactone (ALDACTONE) 25 MG tablet Take 25 mg by mouth daily.     Current Facility-Administered Medications on File Prior to Visit  Medication Dose Route Frequency Provider Last Rate Last Dose  . heparin lock flush 100 unit/mL  500 Units Intravenous Once Lloyd Huger, MD      . sodium chloride flush (NS) 0.9 % injection 10 mL  10 mL Intravenous PRN [name], Kathlene November, MD   10 mL at  02/01/18 0829   There are no Patient Instructions on file for this visit. No follow-ups on file.  Gary [name] A Riah Kehoe, PA-C

## 2018-02-05 ENCOUNTER — Encounter: Payer: Self-pay | Admitting: Internal Medicine

## 2018-02-05 NOTE — Assessment & Plan Note (Signed)
Followed by Dr Eddie Dibbles.

## 2018-02-05 NOTE — Assessment & Plan Note (Signed)
Followed by Dr Eddie Dibbles.  Has insulin pump.  No significant low sugars.  Follow.

## 2018-02-05 NOTE — Assessment & Plan Note (Signed)
Undergoing treatment.  Seeing oncology.

## 2018-02-05 NOTE — Assessment & Plan Note (Signed)
Followed by cardiology.  Continue risk factor modification.   

## 2018-02-05 NOTE — Assessment & Plan Note (Signed)
Blood pressure under good control.  Continue same medication regimen.  Follow pressures.  Follow metabolic panel.   

## 2018-02-05 NOTE — Assessment & Plan Note (Signed)
Stable

## 2018-02-05 NOTE — Assessment & Plan Note (Signed)
Being followed by hematology/oncology.

## 2018-02-05 NOTE — Assessment & Plan Note (Signed)
Doing better.  Wrapped.  Followed by vascular surgery.

## 2018-02-05 NOTE — Assessment & Plan Note (Signed)
Lungs clear.  Breathing stable.  Followed by cardiology.   

## 2018-02-05 NOTE — Assessment & Plan Note (Signed)
Seeing vascular surgery.  Legs wrapped.  Swelling better.  Follow.

## 2018-02-05 NOTE — Assessment & Plan Note (Signed)
On pravastatin.  Low cholesterol diet and exercise.  Follow lipid panel and liver function tests.   

## 2018-02-06 NOTE — Progress Notes (Signed)
Gary [name]  [phone]) [phone] [phone]) [phone]  ID: Gary [name] OB: 06/30/1946  MR#: [id number]  HQI#:[id number]  Patient Care Team: [name], MD as PCP - General (Internal Medicine)  CHIEF COMPLAINT: Mantle cell lymphoma.  INTERVAL HISTORY: Patient returns to clinic today for further evaluation and reconsideration of cycle 3, day 1 of Rituxan and Treanda.  He currently feels well and is at his baseline.  He denies any fevers, chills, or night sweats. He has a good appetite and denies weight loss. He has no neurologic complaints. He denies any chest pain or shortness of breath. He has no nausea, vomiting, constipation, or diarrhea.  He denies any melena or hematochezia.  He has no urinary complaints.  Patient offers no specific complaints today.  REVIEW OF SYSTEMS:   Review of Systems  Constitutional: Negative.  Negative for diaphoresis, fever, malaise/fatigue and weight loss.  Respiratory: Negative.  Negative for cough and shortness of breath.   Cardiovascular: Positive for leg swelling. Negative for chest pain and palpitations.  Gastrointestinal: Negative.  Negative for abdominal pain, blood in stool and melena.  Genitourinary: Negative.  Negative for hematuria.  Musculoskeletal: Negative.  Negative for falls.  Skin: Negative.  Negative for rash.  Neurological: Negative.  Negative for sensory change, focal weakness and weakness.  Endo/Heme/Allergies: Does not bruise/bleed easily.  Psychiatric/Behavioral: Negative.  The patient is not nervous/anxious.     As per HPI. Otherwise, a complete review of systems is negative.  PAST MEDICAL HISTORY: Past Medical History:  Diagnosis Date  . Depression   . 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 Gastroparesis   . Graves disease   . Hypercholesterolemia   . Hypertension   . Mantle cell lymphoma ([place])   . Peripheral neuropathy     PAST SURGICAL  HISTORY: Past Surgical History:  Procedure Laterality Date  . CHOLECYSTECTOMY    . NEPHRECTOMY     right  . PACEMAKER INSERTION N/A 12/07/2017   Procedure: INSERTION PACEMAKER DUAL CHAMBER INITIAL INSERT;  Surgeon: Isaias Cowman, MD;  Location: [facility];  Service: Cardiovascular;  Laterality: N/A;  . PORTA CATH INSERTION N/A 11/03/2017   Procedure: PORTA CATH INSERTION;  Surgeon: Katha Cabal, MD;  Location: [facility];  Service: Cardiovascular;  Laterality: N/A;    FAMILY HISTORY Family History  Problem Relation Age of Onset  . Breast cancer Mother   . Diabetes Father   . Cancer Father        Lymphoma  . Alcohol abuse Maternal Uncle   . Diabetes Sister   . Heart disease Unknown        grandfather       ADVANCED DIRECTIVES:    HEALTH MAINTENANCE: Social History   Tobacco Use  . Smoking status: Former Research scientist (life sciences)  . Smokeless tobacco: Current User    Types: Chew  Substance Use Topics  . Alcohol use: Yes    Alcohol/week: 1.8 oz    Types: 3 Cans of beer per week    Comment: nightly  . Drug use: No     Colonoscopy:  PAP:  Bone density:  Lipid panel:  Allergies  Allergen Reactions  . Rofecoxib Nausea Only    Current Outpatient Medications  Medication Sig Dispense Refill  . acyclovir (ZOVIRAX) 400 MG tablet Take 1 tablet (400 mg total) by mouth daily. 30 tablet 3  . carvedilol (COREG) 3.125 MG tablet Take 3.125 mg by mouth 2 (two) times daily  with a meal.     . ferrous sulfate 325 (65 FE) MG tablet Take 325 mg by mouth daily.    . furosemide (LASIX) 20 MG tablet Take 1 tablet (20 mg total) by mouth daily. 30 tablet 1  . insulin aspart (NOVOLOG) 100 UNIT/ML injection Basal rate 12am 0.8 units per hour, 9am 0.9 units per hour. (total basal insulin 20.7 units). Carbohydrate ratio 1 units for every 8gm of carbohydrate. Correction factor 1 units for every 64m/dl over target cbg. Target CBG 80-120 1 vial 12  . levothyroxine (SYNTHROID, LEVOTHROID) 25  MCG tablet Take 25 mcg by mouth daily.    .Marland Kitchenlisinopril (PRINIVIL,ZESTRIL) 20 MG tablet Take 20 mg by mouth daily.    . Multiple Vitamin (MULTIVITAMIN) tablet Take 1 tablet by mouth daily.    . pravastatin (PRAVACHOL) 40 MG tablet Take 1 tablet (40 mg total) by mouth at bedtime. Will need office visit for any more refills 30 tablet 0  . spironolactone (ALDACTONE) 25 MG tablet Take 25 mg by mouth daily.    .Marland KitchenACCU-CHEK AVIVA PLUS test strip USE AS INSTRUCTED. USE 1 STRIP EVERY 3 HOURS . ACCU-CHECK AVIVA PLUS TEST STRIP. DX E10.649  12  . GLUCAGON EMERGENCY 1 MG injection     . glucose 4 GM chewable tablet Chew 1 tablet by mouth once as needed for low blood sugar.    . lidocaine-prilocaine (EMLA) cream Apply 1 application topically as needed. Apply to port prior to chemotherapy appointment, cover with plastic wrap. (Patient not taking: Reported on 02/08/2018) 30 g 2  . naproxen sodium (ALEVE) 220 MG tablet Take 220 mg by mouth daily as needed.     No current facility-administered medications for this visit.    Facility-Administered Medications Ordered in Other Visits  Medication Dose Route Frequency Provider Last Rate Last Dose  . heparin lock flush 100 unit/mL  500 Units Intravenous Once FLloyd Huger MD      . sodium chloride flush (NS) 0.9 % injection 10 mL  10 mL Intravenous PRN FLloyd Huger MD   10 mL at 02/01/18 0829  . sodium chloride flush (NS) 0.9 % injection 10 mL  10 mL Intracatheter PRN FLloyd Huger MD      Race: Please given the multiple things to do this would like to do a little bit for myself before do anything  OBJECTIVE: Vitals:   02/08/18 0835  BP: 116/64  Pulse: 76  Resp: 18  Temp: (!) 95.2 F (35.1 C)     Body mass index is 33.69 kg/m.    ECOG FS:0 - Asymptomatic  General: Well-developed, well-nourished, no acute distress. Eyes: Pink conjunctiva, anicteric sclera. Lungs: Clear to auscultation bilaterally. Heart: Regular rate and rhythm. No  rubs, murmurs, or gallops. Abdomen: Soft, nontender, nondistended. No organomegaly noted, normoactive bowel sounds. Musculoskeletal: No edema, cyanosis, or clubbing. Neuro: Alert, answering all questions appropriately. Cranial nerves grossly intact. Skin: No rashes or petechiae noted. Psych: Normal affect. Lymphatics: No cervical, calvicular, axillary or inguinal LAD.   LAB RESULTS:  Lab Results  Component Value Date   NA 141 02/08/2018   K 3.6 02/08/2018   CL 113 (H) 02/08/2018   CO2 19 (L) 02/08/2018   GLUCOSE 109 (H) 02/08/2018   BUN 23 (H) 02/08/2018   CREATININE 1.62 (H) 02/08/2018   CALCIUM 8.5 (L) 02/08/2018   PROT 5.8 (L) 02/08/2018   ALBUMIN 2.9 (L) 02/08/2018   AST 23 02/08/2018   ALT 12 (L) 02/08/2018  ALKPHOS 102 02/08/2018   BILITOT 0.8 02/08/2018   GFRNONAA 42 (L) 02/08/2018   GFRAA 48 (L) 02/08/2018    Lab Results  Component Value Date   WBC 3.9 02/08/2018   NEUTROABS 2.3 02/08/2018   HGB 9.9 (L) 02/08/2018   HCT 27.4 (L) 02/08/2018   MCV 90.1 02/08/2018   PLT 92 (L) 02/08/2018   Lab Results  Component Value Date   IRON 13 (L) 08/11/2017   TIBC 223 (L) 01/12/2017   IRONPCTSAT 3.8 (L) 08/11/2017   Lab Results  Component Value Date   FERRITIN 42.0 08/11/2017     STUDIES:   ASSESSMENT: Low-grade mantle cell lymphoma diagnosed in September 2015, iron deficiency anemia.  PLAN:    1. Mantle cell lymphoma, stage III: Patient's initial diagnosis was on inguinal lymph node biopsy on July 12, 2014.  CT scan results from January 2019 reviewed independently with clear evidence of progression of disease. Patient declined bone marrow biopsy to complete the staging workup.  Although, this would not change his overall treatment plan.  Patient received cycle 1, day 1 of Rituxan and Treanda on January 29 and January 30, but then cycle 2 was delayed secondary to symptomatic bradycardia requiring pacemaker.  Restaging CT scan on January 03, 2018 revealed  improvement of disease burden after just one cycle.  Proceed with cycle 3, day 1 of Rituxan and Treanda today despite mild thrombocytopenia.  Will dose reduced Treanda for the remainder of the cycles.  Return to clinic tomorrow for Treanda only, in 2 weeks for laboratory work, and then in 4 weeks for further evaluation and consideration of cycle 4, day 1.   2. Decreased ejection fraction: Chronic and unchanged.  Previously patient had an ejection fraction of 40% with no clinical CHF.  Continue monitoring and evaluation per cardiology. 3. Diabetes: Patient blood glucose under better control today.  Continue insulin pump as directed. 4. Thrombocytopenia: Decreased, but will proceed with treatment as above using dose reduced Treanda. 5. Hypertension: Patient's blood pressure was within normal limits today. 6.  Iron deficiency anemia: Patient's hemoglobin continues to slowly trend down.  He last received IV Feraheme on September 28, 2017.  Will consider additional IV Feraheme in the future if necessary.   7.  Bradycardia: Patient has pacemaker in place. 8.  Renal insufficiency: Patient's creatinine continues to slowly trend up.  Monitor.  Patient expressed understanding and was in agreement with this plan. He also understands that He can call clinic at any time with any questions, concerns, or complaints.    Lloyd Huger, MD   02/09/2018 2:57 PM

## 2018-02-08 ENCOUNTER — Other Ambulatory Visit: Payer: Self-pay

## 2018-02-08 ENCOUNTER — Inpatient Hospital Stay: Payer: Medicare Other

## 2018-02-08 ENCOUNTER — Inpatient Hospital Stay (HOSPITAL_BASED_OUTPATIENT_CLINIC_OR_DEPARTMENT_OTHER): Payer: Medicare Other | Admitting: Oncology

## 2018-02-08 VITALS — BP 116/64 | HR 76 | Temp 95.2°F | Resp 18 | Wt 234.8 lb

## 2018-02-08 DIAGNOSIS — Z79899 Other long term (current) drug therapy: Secondary | ICD-10-CM

## 2018-02-08 DIAGNOSIS — N289 Disorder of kidney and ureter, unspecified: Secondary | ICD-10-CM | POA: Diagnosis not present

## 2018-02-08 DIAGNOSIS — E0500 Thyrotoxicosis with diffuse goiter without thyrotoxic crisis or storm: Secondary | ICD-10-CM | POA: Diagnosis not present

## 2018-02-08 DIAGNOSIS — C8318 Mantle cell lymphoma, lymph nodes of multiple sites: Secondary | ICD-10-CM

## 2018-02-08 DIAGNOSIS — Z807 Family history of other malignant neoplasms of lymphoid, hematopoietic and related tissues: Secondary | ICD-10-CM

## 2018-02-08 DIAGNOSIS — D509 Iron deficiency anemia, unspecified: Secondary | ICD-10-CM | POA: Diagnosis not present

## 2018-02-08 DIAGNOSIS — I10 Essential (primary) hypertension: Secondary | ICD-10-CM

## 2018-02-08 DIAGNOSIS — E11311 Type 2 diabetes mellitus with unspecified diabetic retinopathy with macular edema: Secondary | ICD-10-CM | POA: Diagnosis not present

## 2018-02-08 DIAGNOSIS — Z950 Presence of cardiac pacemaker: Secondary | ICD-10-CM | POA: Diagnosis not present

## 2018-02-08 DIAGNOSIS — T451X5S Adverse effect of antineoplastic and immunosuppressive drugs, sequela: Secondary | ICD-10-CM | POA: Diagnosis not present

## 2018-02-08 DIAGNOSIS — I2510 Atherosclerotic heart disease of native coronary artery without angina pectoris: Secondary | ICD-10-CM

## 2018-02-08 DIAGNOSIS — E1143 Type 2 diabetes mellitus with diabetic autonomic (poly)neuropathy: Secondary | ICD-10-CM | POA: Diagnosis not present

## 2018-02-08 DIAGNOSIS — Z87891 Personal history of nicotine dependence: Secondary | ICD-10-CM

## 2018-02-08 DIAGNOSIS — Z794 Long term (current) use of insulin: Secondary | ICD-10-CM

## 2018-02-08 DIAGNOSIS — Z5112 Encounter for antineoplastic immunotherapy: Secondary | ICD-10-CM | POA: Diagnosis not present

## 2018-02-08 DIAGNOSIS — Z803 Family history of malignant neoplasm of breast: Secondary | ICD-10-CM

## 2018-02-08 DIAGNOSIS — D6959 Other secondary thrombocytopenia: Secondary | ICD-10-CM | POA: Diagnosis not present

## 2018-02-08 DIAGNOSIS — Z5111 Encounter for antineoplastic chemotherapy: Secondary | ICD-10-CM | POA: Diagnosis not present

## 2018-02-08 DIAGNOSIS — E7800 Pure hypercholesterolemia, unspecified: Secondary | ICD-10-CM

## 2018-02-08 LAB — COMPREHENSIVE METABOLIC PANEL
ALBUMIN: 2.9 g/dL — AB (ref 3.5–5.0)
ALT: 12 U/L — ABNORMAL LOW (ref 17–63)
ANION GAP: 9 (ref 5–15)
AST: 23 U/L (ref 15–41)
Alkaline Phosphatase: 102 U/L (ref 38–126)
BUN: 23 mg/dL — AB (ref 6–20)
CHLORIDE: 113 mmol/L — AB (ref 101–111)
CO2: 19 mmol/L — ABNORMAL LOW (ref 22–32)
Calcium: 8.5 mg/dL — ABNORMAL LOW (ref 8.9–10.3)
Creatinine, Ser: 1.62 mg/dL — ABNORMAL HIGH (ref 0.61–1.24)
GFR calc Af Amer: 48 mL/min — ABNORMAL LOW (ref >60)
GFR calc non Af Amer: 42 mL/min — ABNORMAL LOW (ref >60)
GLUCOSE: 109 mg/dL — AB (ref 65–99)
POTASSIUM: 3.6 mmol/L (ref 3.5–5.1)
Sodium: 141 mmol/L (ref 135–145)
TOTAL PROTEIN: 5.8 g/dL — AB (ref 6.5–8.1)
Total Bilirubin: 0.8 mg/dL (ref 0.3–1.2)

## 2018-02-08 LAB — CBC WITH DIFFERENTIAL/PLATELET
BASOS ABS: 0.1 10*3/uL (ref 0–0.1)
BASOS PCT: 2 %
EOS ABS: 0.2 10*3/uL (ref 0–0.7)
EOS PCT: 5 %
HCT: 27.4 % — ABNORMAL LOW (ref 40.0–52.0)
Hemoglobin: 9.9 g/dL — ABNORMAL LOW (ref 13.0–18.0)
Lymphocytes Relative: 25 %
Lymphs Abs: 1 10*3/uL (ref 1.0–3.6)
MCH: 32.4 pg (ref 26.0–34.0)
MCHC: 36 g/dL (ref 32.0–36.0)
MCV: 90.1 fL (ref 80.0–100.0)
MONO ABS: 0.4 10*3/uL (ref 0.2–1.0)
MONOS PCT: 10 %
Neutro Abs: 2.3 10*3/uL (ref 1.4–6.5)
Neutrophils Relative %: 58 %
PLATELETS: 92 10*3/uL — AB (ref 150–440)
RBC: 3.04 MIL/uL — ABNORMAL LOW (ref 4.40–5.90)
RDW: 15.8 % — AB (ref 11.5–14.5)
WBC: 3.9 10*3/uL (ref 3.8–10.6)

## 2018-02-08 MED ORDER — HEPARIN SOD (PORK) LOCK FLUSH 100 UNIT/ML IV SOLN
500.0000 [IU] | Freq: Once | INTRAVENOUS | Status: AC
  Administered 2018-02-08: 500 [IU] via INTRAVENOUS
  Filled 2018-02-08: qty 5

## 2018-02-08 MED ORDER — SODIUM CHLORIDE 0.9 % IV SOLN
70.0000 mg/m2 | Freq: Once | INTRAVENOUS | Status: AC
  Administered 2018-02-08: 175 mg via INTRAVENOUS
  Filled 2018-02-08: qty 7

## 2018-02-08 MED ORDER — PALONOSETRON HCL INJECTION 0.25 MG/5ML
0.2500 mg | Freq: Once | INTRAVENOUS | Status: AC
  Administered 2018-02-08: 0.25 mg via INTRAVENOUS

## 2018-02-08 MED ORDER — SODIUM CHLORIDE 0.9 % IV SOLN
375.0000 mg/m2 | Freq: Once | INTRAVENOUS | Status: AC
  Administered 2018-02-08: 900 mg via INTRAVENOUS
  Filled 2018-02-08: qty 50

## 2018-02-08 MED ORDER — ACETAMINOPHEN 325 MG PO TABS
650.0000 mg | ORAL_TABLET | Freq: Once | ORAL | Status: AC
  Administered 2018-02-08: 650 mg via ORAL
  Filled 2018-02-08: qty 2

## 2018-02-08 MED ORDER — SODIUM CHLORIDE 0.9 % IV SOLN: 375.0000 mg/m2 | Freq: Once | INTRAVENOUS | Status: DC

## 2018-02-08 MED ORDER — SODIUM CHLORIDE 0.9 % IV SOLN: 10.0000 mg | Freq: Once | INTRAVENOUS | Status: DC

## 2018-02-08 MED ORDER — DEXAMETHASONE SODIUM PHOSPHATE 10 MG/ML IJ SOLN
10.0000 mg | Freq: Once | INTRAMUSCULAR | Status: AC
  Administered 2018-02-08: 10 mg via INTRAVENOUS
  Filled 2018-02-08: qty 1

## 2018-02-08 MED ORDER — DIPHENHYDRAMINE HCL 25 MG PO CAPS
25.0000 mg | ORAL_CAPSULE | Freq: Once | ORAL | Status: AC
  Administered 2018-02-08: 25 mg via ORAL
  Filled 2018-02-08: qty 1

## 2018-02-08 MED ORDER — SODIUM CHLORIDE 0.9% FLUSH
10.0000 mL | Freq: Once | INTRAVENOUS | Status: AC
  Administered 2018-02-08: 10 mL via INTRAVENOUS
  Filled 2018-02-08: qty 10

## 2018-02-08 MED ORDER — SODIUM CHLORIDE 0.9 % IV SOLN
Freq: Once | INTRAVENOUS | Status: AC
  Administered 2018-02-08: 10:00:00 via INTRAVENOUS
  Filled 2018-02-08: qty 1000

## 2018-02-08 NOTE — Progress Notes (Signed)
Reviewed parameters, proceed with treatment per Dr. [name].

## 2018-02-08 NOTE — Progress Notes (Signed)
Per pt stated he doing well  BS this am per pt- 178  Pt has insulin pump

## 2018-02-09 ENCOUNTER — Inpatient Hospital Stay: Payer: Medicare Other | Attending: Oncology

## 2018-02-09 VITALS — BP 110/67 | HR 82 | Temp 96.9°F | Resp 20

## 2018-02-09 DIAGNOSIS — Z5111 Encounter for antineoplastic chemotherapy: Secondary | ICD-10-CM | POA: Insufficient documentation

## 2018-02-09 DIAGNOSIS — E0500 Thyrotoxicosis with diffuse goiter without thyrotoxic crisis or storm: Secondary | ICD-10-CM | POA: Insufficient documentation

## 2018-02-09 DIAGNOSIS — R5383 Other fatigue: Secondary | ICD-10-CM | POA: Diagnosis not present

## 2018-02-09 DIAGNOSIS — E7800 Pure hypercholesterolemia, unspecified: Secondary | ICD-10-CM | POA: Diagnosis not present

## 2018-02-09 DIAGNOSIS — Z5112 Encounter for antineoplastic immunotherapy: Secondary | ICD-10-CM | POA: Insufficient documentation

## 2018-02-09 DIAGNOSIS — R531 Weakness: Secondary | ICD-10-CM | POA: Diagnosis not present

## 2018-02-09 DIAGNOSIS — Z79899 Other long term (current) drug therapy: Secondary | ICD-10-CM | POA: Diagnosis not present

## 2018-02-09 DIAGNOSIS — Z807 Family history of other malignant neoplasms of lymphoid, hematopoietic and related tissues: Secondary | ICD-10-CM | POA: Insufficient documentation

## 2018-02-09 DIAGNOSIS — E1143 Type 2 diabetes mellitus with diabetic autonomic (poly)neuropathy: Secondary | ICD-10-CM | POA: Insufficient documentation

## 2018-02-09 DIAGNOSIS — Z794 Long term (current) use of insulin: Secondary | ICD-10-CM | POA: Diagnosis not present

## 2018-02-09 DIAGNOSIS — N289 Disorder of kidney and ureter, unspecified: Secondary | ICD-10-CM | POA: Diagnosis not present

## 2018-02-09 DIAGNOSIS — D696 Thrombocytopenia, unspecified: Secondary | ICD-10-CM | POA: Insufficient documentation

## 2018-02-09 DIAGNOSIS — R600 Localized edema: Secondary | ICD-10-CM | POA: Diagnosis not present

## 2018-02-09 DIAGNOSIS — Z87891 Personal history of nicotine dependence: Secondary | ICD-10-CM | POA: Diagnosis not present

## 2018-02-09 DIAGNOSIS — R5381 Other malaise: Secondary | ICD-10-CM | POA: Diagnosis not present

## 2018-02-09 DIAGNOSIS — Z950 Presence of cardiac pacemaker: Secondary | ICD-10-CM | POA: Diagnosis not present

## 2018-02-09 DIAGNOSIS — D509 Iron deficiency anemia, unspecified: Secondary | ICD-10-CM | POA: Insufficient documentation

## 2018-02-09 DIAGNOSIS — I10 Essential (primary) hypertension: Secondary | ICD-10-CM | POA: Diagnosis not present

## 2018-02-09 DIAGNOSIS — Z803 Family history of malignant neoplasm of breast: Secondary | ICD-10-CM | POA: Insufficient documentation

## 2018-02-09 DIAGNOSIS — C8318 Mantle cell lymphoma, lymph nodes of multiple sites: Secondary | ICD-10-CM | POA: Diagnosis not present

## 2018-02-09 MED ORDER — SODIUM CHLORIDE 0.9% FLUSH
10.0000 mL | INTRAVENOUS | Status: DC | PRN
  Filled 2018-02-09: qty 10

## 2018-02-09 MED ORDER — SODIUM CHLORIDE 0.9 % IV SOLN
70.0000 mg/m2 | Freq: Once | INTRAVENOUS | Status: AC
  Administered 2018-02-09: 175 mg via INTRAVENOUS
  Filled 2018-02-09: qty 7

## 2018-02-09 MED ORDER — SODIUM CHLORIDE 0.9 % IV SOLN
Freq: Once | INTRAVENOUS | Status: AC
  Administered 2018-02-09: 13:00:00 via INTRAVENOUS
  Filled 2018-02-09: qty 1000

## 2018-02-09 MED ORDER — HEPARIN SOD (PORK) LOCK FLUSH 100 UNIT/ML IV SOLN
500.0000 [IU] | Freq: Once | INTRAVENOUS | Status: AC | PRN
  Administered 2018-02-09: 500 [IU]
  Filled 2018-02-09: qty 5

## 2018-02-09 MED ORDER — SODIUM CHLORIDE 0.9 % IV SOLN: 10.0000 mg | Freq: Once | INTRAVENOUS | Status: DC

## 2018-02-09 MED ORDER — DEXAMETHASONE SODIUM PHOSPHATE 10 MG/ML IJ SOLN
10.0000 mg | Freq: Once | INTRAMUSCULAR | Status: AC
  Administered 2018-02-09: 10 mg via INTRAVENOUS
  Filled 2018-02-09: qty 1

## 2018-02-11 ENCOUNTER — Encounter (INDEPENDENT_AMBULATORY_CARE_PROVIDER_SITE_OTHER): Payer: Self-pay

## 2018-02-11 ENCOUNTER — Ambulatory Visit (INDEPENDENT_AMBULATORY_CARE_PROVIDER_SITE_OTHER): Payer: Medicare Other | Admitting: Vascular Surgery

## 2018-02-11 VITALS — BP 111/67 | HR 77 | Resp 16 | Ht 70.0 in | Wt 240.0 lb

## 2018-02-11 DIAGNOSIS — R600 Localized edema: Secondary | ICD-10-CM | POA: Diagnosis not present

## 2018-02-11 DIAGNOSIS — I2510 Atherosclerotic heart disease of native coronary artery without angina pectoris: Secondary | ICD-10-CM | POA: Diagnosis not present

## 2018-02-11 NOTE — Progress Notes (Signed)
History of Present Illness  There is no documented history at this time  Assessments & Plan   There are no diagnoses linked to this encounter.    Additional instructions  Subjective:  Patient presents with venous ulcer of the Bilateral lower extremity.    Procedure:  3 layer unna wrap was placed Bilateral lower extremity.   Plan:   Follow up in one week.  

## 2018-02-18 ENCOUNTER — Encounter (INDEPENDENT_AMBULATORY_CARE_PROVIDER_SITE_OTHER): Payer: Self-pay

## 2018-02-18 ENCOUNTER — Ambulatory Visit (INDEPENDENT_AMBULATORY_CARE_PROVIDER_SITE_OTHER): Payer: Medicare Other | Admitting: Vascular Surgery

## 2018-02-18 VITALS — BP 117/62 | HR 87 | Resp 16 | Ht 70.0 in | Wt 232.0 lb

## 2018-02-18 DIAGNOSIS — R600 Localized edema: Secondary | ICD-10-CM

## 2018-02-18 DIAGNOSIS — I2510 Atherosclerotic heart disease of native coronary artery without angina pectoris: Secondary | ICD-10-CM | POA: Diagnosis not present

## 2018-02-18 NOTE — Progress Notes (Signed)
History of Present Illness  There is no documented history at this time  Assessments & Plan   There are no diagnoses linked to this encounter.    Additional instructions  Subjective:  Patient presents with venous ulcer of the Bilateral lower extremity.    Procedure:  3 layer unna wrap was placed Bilateral lower extremity.   Plan:   Follow up in one week.  

## 2018-02-22 ENCOUNTER — Telehealth: Payer: Self-pay | Admitting: *Deleted

## 2018-02-22 ENCOUNTER — Other Ambulatory Visit: Payer: Self-pay

## 2018-02-22 ENCOUNTER — Inpatient Hospital Stay: Payer: Medicare Other

## 2018-02-22 DIAGNOSIS — Z5111 Encounter for antineoplastic chemotherapy: Secondary | ICD-10-CM | POA: Diagnosis not present

## 2018-02-22 DIAGNOSIS — C8318 Mantle cell lymphoma, lymph nodes of multiple sites: Secondary | ICD-10-CM

## 2018-02-22 DIAGNOSIS — D509 Iron deficiency anemia, unspecified: Secondary | ICD-10-CM | POA: Diagnosis not present

## 2018-02-22 DIAGNOSIS — R531 Weakness: Secondary | ICD-10-CM | POA: Diagnosis not present

## 2018-02-22 DIAGNOSIS — D696 Thrombocytopenia, unspecified: Secondary | ICD-10-CM | POA: Diagnosis not present

## 2018-02-22 DIAGNOSIS — Z5112 Encounter for antineoplastic immunotherapy: Secondary | ICD-10-CM | POA: Diagnosis not present

## 2018-02-22 LAB — CBC WITH DIFFERENTIAL/PLATELET
Basophils Absolute: 0 10*3/uL (ref 0–0.1)
Basophils Relative: 5 %
EOS PCT: 13 %
Eosinophils Absolute: 0 10*3/uL (ref 0–0.7)
HCT: 27.2 % — ABNORMAL LOW (ref 40.0–52.0)
HEMOGLOBIN: 9.6 g/dL — AB (ref 13.0–18.0)
LYMPHS PCT: 13 %
Lymphs Abs: 0 10*3/uL — ABNORMAL LOW (ref 1.0–3.6)
MCH: 32.6 pg (ref 26.0–34.0)
MCHC: 35.3 g/dL (ref 32.0–36.0)
MCV: 92.3 fL (ref 80.0–100.0)
MONO ABS: 0.1 10*3/uL — AB (ref 0.2–1.0)
MONOS PCT: 16 %
NEUTROS ABS: 0.2 10*3/uL — AB (ref 1.4–6.5)
Neutrophils Relative %: 53 %
Platelets: 101 10*3/uL — ABNORMAL LOW (ref 150–440)
RBC: 2.94 MIL/uL — ABNORMAL LOW (ref 4.40–5.90)
RDW: 16 % — AB (ref 11.5–14.5)
WBC: 0.4 10*3/uL — CL (ref 3.8–10.6)

## 2018-02-22 LAB — COMPREHENSIVE METABOLIC PANEL
ALBUMIN: 3.2 g/dL — AB (ref 3.5–5.0)
ALK PHOS: 109 U/L (ref 38–126)
ALT: 18 U/L (ref 17–63)
ANION GAP: 8 (ref 5–15)
AST: 30 U/L (ref 15–41)
BILIRUBIN TOTAL: 0.9 mg/dL (ref 0.3–1.2)
BUN: 18 mg/dL (ref 6–20)
CO2: 19 mmol/L — AB (ref 22–32)
Calcium: 8.2 mg/dL — ABNORMAL LOW (ref 8.9–10.3)
Chloride: 110 mmol/L (ref 101–111)
Creatinine, Ser: 1.25 mg/dL — ABNORMAL HIGH (ref 0.61–1.24)
GFR calc Af Amer: >60 mL/min (ref >60)
GFR calc non Af Amer: 57 mL/min — ABNORMAL LOW (ref >60)
GLUCOSE: 118 mg/dL — AB (ref 65–99)
POTASSIUM: 3.6 mmol/L (ref 3.5–5.1)
SODIUM: 137 mmol/L (ref 135–145)
Total Protein: 6.1 g/dL — ABNORMAL LOW (ref 6.5–8.1)

## 2018-02-22 NOTE — Telephone Encounter (Signed)
Ok thanks.  He got rituxan and treanda 2 weeks ago.  Keep f/u as scheduled.

## 2018-02-22 NOTE — Telephone Encounter (Signed)
Received phone call from [facility] in cancer center lab - 2:46PM.  Pt has a critical Gary [name] of 0.2.  Dr. [name] was made aware of critical value at 1500. Read back process performed with lab tech and md.

## 2018-02-24 DIAGNOSIS — E10649 Type 1 diabetes mellitus with hypoglycemia without coma: Secondary | ICD-10-CM | POA: Diagnosis not present

## 2018-02-24 DIAGNOSIS — E039 Hypothyroidism, unspecified: Secondary | ICD-10-CM | POA: Diagnosis not present

## 2018-02-25 ENCOUNTER — Encounter (INDEPENDENT_AMBULATORY_CARE_PROVIDER_SITE_OTHER): Payer: Self-pay

## 2018-02-25 ENCOUNTER — Ambulatory Visit (INDEPENDENT_AMBULATORY_CARE_PROVIDER_SITE_OTHER): Payer: Medicare Other | Admitting: Vascular Surgery

## 2018-02-25 VITALS — BP 107/57 | HR 121 | Resp 17 | Ht 70.0 in | Wt 232.0 lb

## 2018-02-25 DIAGNOSIS — I890 Lymphedema, not elsewhere classified: Secondary | ICD-10-CM | POA: Diagnosis not present

## 2018-02-25 NOTE — Progress Notes (Signed)
History of Present Illness  There is no documented history at this time  Assessments & Plan   There are no diagnoses linked to this encounter.    Additional instructions  Subjective:  Patient presents with venous ulcer of the Bilateral lower extremity.    Procedure:  3 layer unna wrap was placed Bilateral lower extremity.   Plan:   Follow up in one week.  

## 2018-03-03 DIAGNOSIS — E1159 Type 2 diabetes mellitus with other circulatory complications: Secondary | ICD-10-CM | POA: Diagnosis not present

## 2018-03-03 DIAGNOSIS — E785 Hyperlipidemia, unspecified: Secondary | ICD-10-CM | POA: Diagnosis not present

## 2018-03-03 DIAGNOSIS — E10649 Type 1 diabetes mellitus with hypoglycemia without coma: Secondary | ICD-10-CM | POA: Diagnosis not present

## 2018-03-03 DIAGNOSIS — E1069 Type 1 diabetes mellitus with other specified complication: Secondary | ICD-10-CM | POA: Diagnosis not present

## 2018-03-03 DIAGNOSIS — I10 Essential (primary) hypertension: Secondary | ICD-10-CM | POA: Diagnosis not present

## 2018-03-04 ENCOUNTER — Encounter (INDEPENDENT_AMBULATORY_CARE_PROVIDER_SITE_OTHER): Payer: Self-pay | Admitting: Vascular Surgery

## 2018-03-04 ENCOUNTER — Ambulatory Visit (INDEPENDENT_AMBULATORY_CARE_PROVIDER_SITE_OTHER): Payer: Medicare Other | Admitting: Vascular Surgery

## 2018-03-04 VITALS — BP 123/63 | HR 81 | Resp 16 | Ht 70.0 in | Wt 222.0 lb

## 2018-03-04 DIAGNOSIS — R600 Localized edema: Secondary | ICD-10-CM

## 2018-03-04 NOTE — Progress Notes (Signed)
History of Present Illness  There is no documented history at this time  Assessments & Plan   There are no diagnoses linked to this encounter.    Additional instructions  Subjective:  Patient presents with venous ulcer of the Bilateral lower extremity.    Procedure:  3 layer unna wrap was placed Bilateral lower extremity.   Plan:   Follow up in one week.  

## 2018-03-07 NOTE — Progress Notes (Signed)
[place]  [phone]) [phone] Fax:([phone]) [phone]  ID: Gary [name] OB: September 25, 1946  MR#: [id number]  GBT#:[id number]  Patient Care Team: [name], MD as PCP - General (Internal Medicine)  CHIEF COMPLAINT: Mantle cell lymphoma.  INTERVAL HISTORY: Patient returns patient returns to clinic today for further evaluation and consideration of cycle 4, day 1 of Rituxan and Treanda.  He has had increasing weakness and fatigue over the past several weeks, but otherwise feels well. He denies any fevers, chills, or night sweats. He has a good appetite and denies weight loss. He has no neurologic complaints. He denies any chest pain or shortness of breath. He has no nausea, vomiting, constipation, or diarrhea.  He denies any melena or hematochezia.  He has no urinary complaints.  Patient offers no further specific complaints today.  REVIEW OF SYSTEMS:   Review of Systems  Constitutional: Positive for malaise/fatigue. Negative for diaphoresis, fever and weight loss.  Respiratory: Negative.  Negative for cough and shortness of breath.   Cardiovascular: Positive for leg swelling. Negative for chest pain and palpitations.  Gastrointestinal: Negative.  Negative for abdominal pain, blood in stool and melena.  Genitourinary: Negative.  Negative for hematuria.  Musculoskeletal: Negative.  Negative for falls.  Skin: Negative.  Negative for rash.  Neurological: Positive for weakness. Negative for sensory change and focal weakness.  Endo/Heme/Allergies: Does not bruise/bleed easily.  Psychiatric/Behavioral: Negative.  The patient is not nervous/anxious.     As per HPI. Otherwise, a complete review of systems is negative.  PAST MEDICAL HISTORY: Past Medical History:  Diagnosis Date  . Depression   . 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 Gastroparesis   . Graves disease   . Hypercholesterolemia   . Hypertension   .  Mantle cell lymphoma ([place])   . Peripheral neuropathy     PAST SURGICAL HISTORY: Past Surgical History:  Procedure Laterality Date  . CHOLECYSTECTOMY    . NEPHRECTOMY     right  . PACEMAKER INSERTION N/A 12/07/2017   Procedure: INSERTION PACEMAKER DUAL CHAMBER INITIAL INSERT;  Surgeon: Isaias Cowman, MD;  Location: [facility];  Service: Cardiovascular;  Laterality: N/A;  . PORTA CATH INSERTION N/A 11/03/2017   Procedure: PORTA CATH INSERTION;  Surgeon: Katha Cabal, MD;  Location: [facility];  Service: Cardiovascular;  Laterality: N/A;    FAMILY HISTORY Family History  Problem Relation Age of Onset  . Breast cancer Mother   . Diabetes Father   . Cancer Father        Lymphoma  . Alcohol abuse Maternal Uncle   . Diabetes Sister   . Heart disease Unknown        grandfather       ADVANCED DIRECTIVES:    HEALTH MAINTENANCE: Social History   Tobacco Use  . Smoking status: Former Research scientist (life sciences)  . Smokeless tobacco: Current User    Types: Chew  Substance Use Topics  . Alcohol use: Yes    Alcohol/week: 1.8 oz    Types: 3 Cans of beer per week    Comment: nightly  . Drug use: No     Colonoscopy:  PAP:  Bone density:  Lipid panel:  Allergies  Allergen Reactions  . Rofecoxib Nausea Only    Current Outpatient Medications  Medication Sig Dispense Refill  . acyclovir (ZOVIRAX) 400 MG tablet Take 1 tablet (400 mg total) by mouth daily. 30 tablet 3  . carvedilol (COREG) 3.125 MG tablet  Take 3.125 mg by mouth 2 (two) times daily with a meal.     . ferrous sulfate 325 (65 FE) MG tablet Take 325 mg by mouth daily.    . furosemide (LASIX) 20 MG tablet Take 1 tablet (20 mg total) by mouth daily. 30 tablet 1  . glucose blood (PRECISION QID TEST) test strip Use 8 (eight) times daily as directed. BAYER CONTOUR NEXT TEST STRIPS E10.649    . insulin aspart (NOVOLOG) 100 UNIT/ML injection Basal rate 12am 0.8 units per hour, 9am 0.9 units per hour. (total basal insulin  20.7 units). Carbohydrate ratio 1 units for every 8gm of carbohydrate. Correction factor 1 units for every 29m/dl over target cbg. Target CBG 80-120 1 vial 12  . levothyroxine (SYNTHROID, LEVOTHROID) 25 MCG tablet Take 25 mcg by mouth daily.    .Marland Kitchenlisinopril (PRINIVIL,ZESTRIL) 20 MG tablet Take 20 mg by mouth daily.    . Multiple Vitamin (MULTIVITAMIN) tablet Take 1 tablet by mouth daily.    . pravastatin (PRAVACHOL) 40 MG tablet Take 1 tablet (40 mg total) by mouth at bedtime. Will need office visit for any more refills 30 tablet 0  . ACCU-CHEK AVIVA PLUS test strip USE AS INSTRUCTED. USE 1 STRIP EVERY 3 HOURS . ACCU-CHECK AVIVA PLUS TEST STRIP. DX E10.649  12  . GLUCAGON EMERGENCY 1 MG injection     . glucose 4 GM chewable tablet Chew 1 tablet by mouth once as needed for low blood sugar.    . lidocaine-prilocaine (EMLA) cream Apply 1 application topically as needed. Apply to port prior to chemotherapy appointment, cover with plastic wrap. (Patient not taking: Reported on 02/08/2018) 30 g 2  . naproxen sodium (ALEVE) 220 MG tablet Take 220 mg by mouth daily as needed.    .Marland Kitchenspironolactone (ALDACTONE) 25 MG tablet Take 25 mg by mouth daily.     No current facility-administered medications for this visit.    Facility-Administered Medications Ordered in Other Visits  Medication Dose Route Frequency Provider Last Rate Last Dose  . heparin lock flush 100 unit/mL  500 Units Intravenous Once FLloyd Huger MD      . sodium chloride flush (NS) 0.9 % injection 10 mL  10 mL Intravenous PRN FLloyd Huger MD   10 mL at 02/01/18 [id number] Race: Please given the multiple things to do this would like to do a little bit for myself before do anything  OBJECTIVE: Vitals:   03/08/18 0836 03/08/18 0839  BP: (!) 113/56 (!) 113/56  Pulse: 93 93  Resp: 18 20  Temp: (!) 95.6 F (35.3 C) (!) 95.6 F (35.3 C)     Body mass index is 31.64 kg/m.    ECOG FS:0 - Asymptomatic  General: Well-developed,  well-nourished, no acute distress. Eyes: Pink conjunctiva, anicteric sclera. Lungs: Clear to auscultation bilaterally. Heart: Regular rate and rhythm. No rubs, murmurs, or gallops. Abdomen: Soft, nontender, nondistended. No organomegaly noted, normoactive bowel sounds. Musculoskeletal: No edema, cyanosis, or clubbing. Neuro: Alert, answering all questions appropriately. Cranial nerves grossly intact. Skin: No rashes or petechiae noted. Psych: Normal affect. Lymphatics: No cervical, calvicular, axillary or inguinal LAD.  LAB RESULTS:  Lab Results  Component Value Date   NA 136 03/08/2018   K 3.4 (L) 03/08/2018   CL 105 03/08/2018   CO2 19 (L) 03/08/2018   GLUCOSE 267 (H) 03/08/2018   BUN 27 (H) 03/08/2018   CREATININE 1.49 (H) 03/08/2018   CALCIUM 8.4 (L) 03/08/2018   PROT 6.0 (  L) 03/08/2018   ALBUMIN 2.9 (L) 03/08/2018   AST 28 03/08/2018   ALT 13 (L) 03/08/2018   ALKPHOS 85 03/08/2018   BILITOT 0.7 03/08/2018   GFRNONAA 46 (L) 03/08/2018   GFRAA 53 (L) 03/08/2018    Lab Results  Component Value Date   WBC 7.3 03/08/2018   NEUTROABS 4.5 03/08/2018   HGB 10.6 (L) 03/08/2018   HCT 29.3 (L) 03/08/2018   MCV 90.9 03/08/2018   PLT 147 (L) 03/08/2018   Lab Results  Component Value Date   IRON 13 (L) 08/11/2017   TIBC 223 (L) 01/12/2017   IRONPCTSAT 3.8 (L) 08/11/2017   Lab Results  Component Value Date   FERRITIN 42.0 08/11/2017     STUDIES:   ASSESSMENT: Low-grade mantle cell lymphoma diagnosed in September 2015, iron deficiency anemia.  PLAN:    1. Mantle cell lymphoma, stage III: Patient's initial diagnosis was on inguinal lymph node biopsy on July 12, 2014.  CT scan results from January 2019 reviewed independently with clear evidence of progression of disease. Patient declined bone marrow biopsy to complete the staging workup.  Although, this would not change his overall treatment plan.  Patient received cycle 1, day 1 of Rituxan and Treanda on January 29  and January 30, but then cycle 2 was delayed secondary to symptomatic bradycardia requiring pacemaker.  Restaging CT scan on January 03, 2018 revealed improvement of disease burden after just one cycle.  Proceed with cycle 4, day 1 of Rituxan and Treanda today.  Previously, Donnie Aho has been dose reduced secondary to thrombocytopenia.  Return to clinic tomorrow for [place] only and then in 4 weeks for further evaluation and consideration of cycle 5.  We will reimage with CT scan in 1 to 2 days prior to the next cycle.   2. Decreased ejection fraction: Chronic and unchanged.  Previously patient had an ejection fraction of 40% with no clinical CHF.  Continue monitoring and evaluation per cardiology. 3. Diabetes: Patient's blood glucose is elevated today. Continue insulin pump as directed. 4. Thrombocytopenia: Improved.  Proceed with dose reduced Treanda as above. 5. Hypertension: Patient's blood pressure was within normal limits today. 6.  Iron deficiency anemia: Patient's hemoglobin has improved to 10.6 today. He last received IV Feraheme on September 28, 2017.  Will consider additional IV Feraheme in the future if necessary.   7.  Bradycardia: Patient has pacemaker in place. 8.  Renal insufficiency: Patient's creatinine remains mildly elevated.  Monitor.  Patient expressed understanding and was in agreement with this plan. He also understands that He can call clinic at any time with any questions, concerns, or complaints.    Lloyd Huger, MD   03/11/2018 6:35 AM

## 2018-03-08 ENCOUNTER — Other Ambulatory Visit: Payer: Self-pay

## 2018-03-08 ENCOUNTER — Inpatient Hospital Stay (HOSPITAL_BASED_OUTPATIENT_CLINIC_OR_DEPARTMENT_OTHER): Payer: Medicare Other | Admitting: Oncology

## 2018-03-08 ENCOUNTER — Inpatient Hospital Stay: Payer: Medicare Other

## 2018-03-08 VITALS — BP 91/52 | HR 72 | Temp 97.0°F | Resp 18

## 2018-03-08 VITALS — BP 113/56 | HR 93 | Temp 95.6°F | Resp 20 | Wt 220.5 lb

## 2018-03-08 DIAGNOSIS — I2510 Atherosclerotic heart disease of native coronary artery without angina pectoris: Secondary | ICD-10-CM | POA: Diagnosis not present

## 2018-03-08 DIAGNOSIS — E1143 Type 2 diabetes mellitus with diabetic autonomic (poly)neuropathy: Secondary | ICD-10-CM

## 2018-03-08 DIAGNOSIS — Z5111 Encounter for antineoplastic chemotherapy: Secondary | ICD-10-CM | POA: Diagnosis not present

## 2018-03-08 DIAGNOSIS — D509 Iron deficiency anemia, unspecified: Secondary | ICD-10-CM

## 2018-03-08 DIAGNOSIS — R531 Weakness: Secondary | ICD-10-CM | POA: Diagnosis not present

## 2018-03-08 DIAGNOSIS — R600 Localized edema: Secondary | ICD-10-CM

## 2018-03-08 DIAGNOSIS — Z803 Family history of malignant neoplasm of breast: Secondary | ICD-10-CM

## 2018-03-08 DIAGNOSIS — E1159 Type 2 diabetes mellitus with other circulatory complications: Secondary | ICD-10-CM | POA: Diagnosis not present

## 2018-03-08 DIAGNOSIS — I5022 Chronic systolic (congestive) heart failure: Secondary | ICD-10-CM | POA: Diagnosis not present

## 2018-03-08 DIAGNOSIS — I10 Essential (primary) hypertension: Secondary | ICD-10-CM | POA: Diagnosis not present

## 2018-03-08 DIAGNOSIS — E7800 Pure hypercholesterolemia, unspecified: Secondary | ICD-10-CM | POA: Diagnosis not present

## 2018-03-08 DIAGNOSIS — Z87891 Personal history of nicotine dependence: Secondary | ICD-10-CM

## 2018-03-08 DIAGNOSIS — R5383 Other fatigue: Secondary | ICD-10-CM | POA: Diagnosis not present

## 2018-03-08 DIAGNOSIS — Z79899 Other long term (current) drug therapy: Secondary | ICD-10-CM

## 2018-03-08 DIAGNOSIS — C8318 Mantle cell lymphoma, lymph nodes of multiple sites: Secondary | ICD-10-CM

## 2018-03-08 DIAGNOSIS — Z950 Presence of cardiac pacemaker: Secondary | ICD-10-CM | POA: Diagnosis not present

## 2018-03-08 DIAGNOSIS — Z794 Long term (current) use of insulin: Secondary | ICD-10-CM

## 2018-03-08 DIAGNOSIS — R5381 Other malaise: Secondary | ICD-10-CM | POA: Diagnosis not present

## 2018-03-08 DIAGNOSIS — I428 Other cardiomyopathies: Secondary | ICD-10-CM | POA: Diagnosis not present

## 2018-03-08 DIAGNOSIS — N289 Disorder of kidney and ureter, unspecified: Secondary | ICD-10-CM

## 2018-03-08 DIAGNOSIS — D696 Thrombocytopenia, unspecified: Secondary | ICD-10-CM

## 2018-03-08 DIAGNOSIS — E0500 Thyrotoxicosis with diffuse goiter without thyrotoxic crisis or storm: Secondary | ICD-10-CM

## 2018-03-08 DIAGNOSIS — E785 Hyperlipidemia, unspecified: Secondary | ICD-10-CM | POA: Diagnosis not present

## 2018-03-08 DIAGNOSIS — Z5112 Encounter for antineoplastic immunotherapy: Secondary | ICD-10-CM | POA: Diagnosis not present

## 2018-03-08 DIAGNOSIS — R001 Bradycardia, unspecified: Secondary | ICD-10-CM | POA: Diagnosis not present

## 2018-03-08 DIAGNOSIS — I493 Ventricular premature depolarization: Secondary | ICD-10-CM | POA: Diagnosis not present

## 2018-03-08 DIAGNOSIS — Z807 Family history of other malignant neoplasms of lymphoid, hematopoietic and related tissues: Secondary | ICD-10-CM

## 2018-03-08 DIAGNOSIS — E1069 Type 1 diabetes mellitus with other specified complication: Secondary | ICD-10-CM | POA: Diagnosis not present

## 2018-03-08 DIAGNOSIS — I6523 Occlusion and stenosis of bilateral carotid arteries: Secondary | ICD-10-CM | POA: Diagnosis not present

## 2018-03-08 LAB — CBC WITH DIFFERENTIAL/PLATELET
BASOS PCT: 1 %
Basophils Absolute: 0.1 10*3/uL (ref 0–0.1)
EOS ABS: 0.4 10*3/uL (ref 0–0.7)
Eosinophils Relative: 6 %
HCT: 29.3 % — ABNORMAL LOW (ref 40.0–52.0)
Hemoglobin: 10.6 g/dL — ABNORMAL LOW (ref 13.0–18.0)
Lymphocytes Relative: 24 %
Lymphs Abs: 1.7 10*3/uL (ref 1.0–3.6)
MCH: 32.7 pg (ref 26.0–34.0)
MCHC: 36 g/dL (ref 32.0–36.0)
MCV: 90.9 fL (ref 80.0–100.0)
MONO ABS: 0.6 10*3/uL (ref 0.2–1.0)
MONOS PCT: 9 %
Neutro Abs: 4.5 10*3/uL (ref 1.4–6.5)
Neutrophils Relative %: 60 %
Platelets: 147 10*3/uL — ABNORMAL LOW (ref 150–440)
RBC: 3.23 MIL/uL — ABNORMAL LOW (ref 4.40–5.90)
RDW: 15.7 % — AB (ref 11.5–14.5)
WBC: 7.3 10*3/uL (ref 3.8–10.6)

## 2018-03-08 LAB — COMPREHENSIVE METABOLIC PANEL
ALBUMIN: 2.9 g/dL — AB (ref 3.5–5.0)
ALK PHOS: 85 U/L (ref 38–126)
ALT: 13 U/L — AB (ref 17–63)
AST: 28 U/L (ref 15–41)
Anion gap: 12 (ref 5–15)
BUN: 27 mg/dL — AB (ref 6–20)
CO2: 19 mmol/L — AB (ref 22–32)
CREATININE: 1.49 mg/dL — AB (ref 0.61–1.24)
Calcium: 8.4 mg/dL — ABNORMAL LOW (ref 8.9–10.3)
Chloride: 105 mmol/L (ref 101–111)
GFR calc non Af Amer: 46 mL/min — ABNORMAL LOW (ref >60)
GFR, EST AFRICAN AMERICAN: 53 mL/min — AB (ref >60)
GLUCOSE: 267 mg/dL — AB (ref 65–99)
Potassium: 3.4 mmol/L — ABNORMAL LOW (ref 3.5–5.1)
SODIUM: 136 mmol/L (ref 135–145)
Total Bilirubin: 0.7 mg/dL (ref 0.3–1.2)
Total Protein: 6 g/dL — ABNORMAL LOW (ref 6.5–8.1)

## 2018-03-08 MED ORDER — SODIUM CHLORIDE 0.9% FLUSH
10.0000 mL | INTRAVENOUS | Status: DC | PRN
Start: 2018-03-08 — End: 2018-03-08
  Filled 2018-03-08: qty 10

## 2018-03-08 MED ORDER — ACETAMINOPHEN 325 MG PO TABS
650.0000 mg | ORAL_TABLET | Freq: Once | ORAL | Status: AC
Start: 2018-03-08 — End: 2018-03-08
  Administered 2018-03-08: 650 mg via ORAL
  Filled 2018-03-08: qty 2

## 2018-03-08 MED ORDER — PALONOSETRON HCL INJECTION 0.25 MG/5ML
0.2500 mg | Freq: Once | INTRAVENOUS | Status: AC
Start: 2018-03-08 — End: 2018-03-08
  Administered 2018-03-08: 0.25 mg via INTRAVENOUS

## 2018-03-08 MED ORDER — SODIUM CHLORIDE 0.9 % IV SOLN
70.0000 mg/m2 | Freq: Once | INTRAVENOUS | Status: AC
  Administered 2018-03-08: 150 mg via INTRAVENOUS
  Filled 2018-03-08: qty 6

## 2018-03-08 MED ORDER — DEXAMETHASONE SODIUM PHOSPHATE 10 MG/ML IJ SOLN
10.0000 mg | Freq: Once | INTRAMUSCULAR | Status: AC
  Administered 2018-03-08: 10 mg via INTRAVENOUS
  Filled 2018-03-08: qty 1

## 2018-03-08 MED ORDER — SODIUM CHLORIDE 0.9 % IV SOLN
Freq: Once | INTRAVENOUS | Status: AC
  Administered 2018-03-08: 09:00:00 via INTRAVENOUS
  Filled 2018-03-08: qty 1000

## 2018-03-08 MED ORDER — SODIUM CHLORIDE 0.9 % IV SOLN: 70.0000 mg/m2 | Freq: Once | INTRAVENOUS | Status: DC

## 2018-03-08 MED ORDER — SODIUM CHLORIDE 0.9 % IV SOLN: 10.0000 mg | Freq: Once | INTRAVENOUS | Status: DC

## 2018-03-08 MED ORDER — SODIUM CHLORIDE 0.9 % IV SOLN
900.0000 mg | Freq: Once | INTRAVENOUS | Status: AC
  Administered 2018-03-08: 900 mg via INTRAVENOUS
  Filled 2018-03-08: qty 50

## 2018-03-08 MED ORDER — HEPARIN SOD (PORK) LOCK FLUSH 100 UNIT/ML IV SOLN: 500.0000 [IU] | Freq: Once | INTRAVENOUS | Status: DC | PRN

## 2018-03-08 MED ORDER — HEPARIN SOD (PORK) LOCK FLUSH 100 UNIT/ML IV SOLN
500.0000 [IU] | Freq: Once | INTRAVENOUS | Status: AC
  Administered 2018-03-08: 500 [IU] via INTRAVENOUS
  Filled 2018-03-08: qty 5

## 2018-03-08 MED ORDER — SODIUM CHLORIDE 0.9 % IV SOLN: 375.0000 mg/m2 | Freq: Once | INTRAVENOUS | Status: DC

## 2018-03-08 MED ORDER — SODIUM CHLORIDE 0.9% FLUSH
10.0000 mL | INTRAVENOUS | Status: DC | PRN
  Administered 2018-03-08: 10 mL via INTRAVENOUS
  Filled 2018-03-08: qty 10

## 2018-03-08 MED ORDER — DIPHENHYDRAMINE HCL 25 MG PO CAPS
25.0000 mg | ORAL_CAPSULE | Freq: Once | ORAL | Status: AC
  Administered 2018-03-08: 25 mg via ORAL
  Filled 2018-03-08: qty 1

## 2018-03-08 NOTE — Progress Notes (Signed)
BSA changed from 2.43 to 2.18m2.  Bendeka dose differed >10%.  Change to reflect current BSA per MD>

## 2018-03-08 NOTE — Progress Notes (Signed)
Here for follow up. Per pt " I have no energy and I have idea why "per wife appetite down.

## 2018-03-09 ENCOUNTER — Inpatient Hospital Stay: Payer: Medicare Other

## 2018-03-09 VITALS — BP 103/65 | HR 93 | Temp 97.8°F | Resp 20

## 2018-03-09 DIAGNOSIS — C8318 Mantle cell lymphoma, lymph nodes of multiple sites: Secondary | ICD-10-CM | POA: Diagnosis not present

## 2018-03-09 DIAGNOSIS — D696 Thrombocytopenia, unspecified: Secondary | ICD-10-CM | POA: Diagnosis not present

## 2018-03-09 DIAGNOSIS — R531 Weakness: Secondary | ICD-10-CM | POA: Diagnosis not present

## 2018-03-09 DIAGNOSIS — Z5111 Encounter for antineoplastic chemotherapy: Secondary | ICD-10-CM | POA: Diagnosis not present

## 2018-03-09 DIAGNOSIS — D509 Iron deficiency anemia, unspecified: Secondary | ICD-10-CM | POA: Diagnosis not present

## 2018-03-09 DIAGNOSIS — Z5112 Encounter for antineoplastic immunotherapy: Secondary | ICD-10-CM | POA: Diagnosis not present

## 2018-03-09 MED ORDER — SODIUM CHLORIDE 0.9 % IV SOLN: 10.0000 mg | Freq: Once | INTRAVENOUS | Status: DC

## 2018-03-09 MED ORDER — DEXAMETHASONE SODIUM PHOSPHATE 10 MG/ML IJ SOLN
10.0000 mg | Freq: Once | INTRAMUSCULAR | Status: AC
  Administered 2018-03-09: 10 mg via INTRAVENOUS
  Filled 2018-03-09: qty 1

## 2018-03-09 MED ORDER — SODIUM CHLORIDE 0.9 % IV SOLN
70.0000 mg/m2 | Freq: Once | INTRAVENOUS | Status: AC
  Administered 2018-03-09: 150 mg via INTRAVENOUS
  Filled 2018-03-09: qty 6

## 2018-03-09 MED ORDER — HEPARIN SOD (PORK) LOCK FLUSH 100 UNIT/ML IV SOLN
500.0000 [IU] | Freq: Once | INTRAVENOUS | Status: AC | PRN
  Administered 2018-03-09: 500 [IU]
  Filled 2018-03-09: qty 5

## 2018-03-09 MED ORDER — SODIUM CHLORIDE 0.9 % IV SOLN
Freq: Once | INTRAVENOUS | Status: AC
  Administered 2018-03-09: 13:00:00 via INTRAVENOUS
  Filled 2018-03-09: qty 1000

## 2018-03-09 MED ORDER — SODIUM CHLORIDE 0.9% FLUSH
10.0000 mL | INTRAVENOUS | Status: DC | PRN
  Filled 2018-03-09: qty 10

## 2018-03-11 ENCOUNTER — Ambulatory Visit (INDEPENDENT_AMBULATORY_CARE_PROVIDER_SITE_OTHER): Payer: Medicare Other | Admitting: Vascular Surgery

## 2018-03-11 ENCOUNTER — Encounter (INDEPENDENT_AMBULATORY_CARE_PROVIDER_SITE_OTHER): Payer: Self-pay | Admitting: Vascular Surgery

## 2018-03-11 VITALS — BP 104/76 | HR 63 | Resp 18 | Ht 70.0 in | Wt 224.0 lb

## 2018-03-11 DIAGNOSIS — R600 Localized edema: Secondary | ICD-10-CM | POA: Diagnosis not present

## 2018-03-11 DIAGNOSIS — I2510 Atherosclerotic heart disease of native coronary artery without angina pectoris: Secondary | ICD-10-CM

## 2018-03-11 DIAGNOSIS — E1151 Type 2 diabetes mellitus with diabetic peripheral angiopathy without gangrene: Secondary | ICD-10-CM

## 2018-03-11 DIAGNOSIS — I10 Essential (primary) hypertension: Secondary | ICD-10-CM

## 2018-03-11 DIAGNOSIS — I890 Lymphedema, not elsewhere classified: Secondary | ICD-10-CM | POA: Diagnosis not present

## 2018-03-11 DIAGNOSIS — I5022 Chronic systolic (congestive) heart failure: Secondary | ICD-10-CM | POA: Diagnosis not present

## 2018-03-11 NOTE — Assessment & Plan Note (Signed)
He does have a lymphedema pump at home and I think he should start using this regularly.  This should help with swelling.

## 2018-03-11 NOTE — Progress Notes (Signed)
MRN : [id number]  Gary [name] is a 72 y.o. ([birth year]) male who presents with chief complaint of  Chief Complaint  Patient presents with  . Follow-up    3 month no studies/Unna check  .  History of Present Illness: Patient returns today in follow up of leg swelling.  With several weeks of Unna boots, his swelling is under better control.  He still has some shallow ulcerations and weeping from the right lateral lower leg.  No current ulceration seen on the left.  No fevers or chills.  Only mildly uncomfortable.         Current Outpatient Medications  Medication Sig Dispense Refill  . ACCU-CHEK AVIVA PLUS test strip USE AS INSTRUCTED. USE 1 STRIP EVERY 3 HOURS . ACCU-CHECK AVIVA PLUS TEST STRIP. DX E10.649  12  . acyclovir (ZOVIRAX) 400 MG tablet Take 1 tablet (400 mg total) by mouth daily. 30 tablet 3  . amoxicillin-clavulanate (AUGMENTIN) 875-125 MG tablet Take 1 tablet by mouth every 12 (twelve) hours. 14 tablet 0  . carvedilol (COREG) 3.125 MG tablet Take 3.125 mg by mouth 2 (two) times daily with a meal.     . ferrous sulfate 325 (65 FE) MG tablet Take 325 mg by mouth daily.    . furosemide (LASIX) 20 MG tablet Take 1 tablet (20 mg total) by mouth daily. 30 tablet 1  . GLUCAGON EMERGENCY 1 MG injection     . glucose 4 GM chewable tablet Chew 1 tablet by mouth once as needed for low blood sugar.    . insulin aspart (NOVOLOG) 100 UNIT/ML injection Basal rate 12am 0.8 units per hour, 9am 0.9 units per hour. (total basal insulin 20.7 units). Carbohydrate ratio 1 units for every 8gm of carbohydrate. Correction factor 1 units for every 5m/dl over target cbg. Target CBG 80-120 1 vial 12  . levothyroxine (SYNTHROID, LEVOTHROID) 25 MCG tablet Take 25 mcg by mouth daily.    .Marland Kitchenlidocaine-prilocaine (EMLA) cream Apply 1 application topically as needed. Apply to port prior to chemotherapy appointment, cover with plastic wrap. 30 g 2  . lisinopril (PRINIVIL,ZESTRIL) 20 MG tablet  Take 20 mg by mouth daily.    . Multiple Vitamin (MULTIVITAMIN) tablet Take 1 tablet by mouth daily.    . naproxen sodium (ALEVE) 220 MG tablet Take 220 mg by mouth daily as needed.    . ondansetron (ZOFRAN) 8 MG tablet Take 1 tablet (8 mg total) by mouth 2 (two) times daily as needed for refractory nausea / vomiting. 30 tablet 2  . pantoprazole (PROTONIX) 40 MG tablet TAKE 1 TABLET (40 MG TOTAL) BY MOUTH DAILY. 30 tablet 0  . pravastatin (PRAVACHOL) 40 MG tablet Take 1 tablet (40 mg total) by mouth at bedtime. Will need office visit for any more refills 30 tablet 0  . prochlorperazine (COMPAZINE) 10 MG tablet Take 1 tablet (10 mg total) by mouth every 6 (six) hours as needed (Nausea or vomiting). 60 tablet 2  . spironolactone (ALDACTONE) 25 MG tablet Take 25 mg by mouth daily.     No current facility-administered medications for this visit.        Past Medical History:  Diagnosis Date  . Depression   . 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 Gastroparesis   . Graves disease   . Hypercholesterolemia   . Hypertension   . Mantle cell lymphoma ([place]   . Peripheral neuropathy  Past Surgical History:  Procedure Laterality Date  . CHOLECYSTECTOMY    . NEPHRECTOMY     right  . PACEMAKER INSERTION N/A 12/07/2017   Procedure: INSERTION PACEMAKER DUAL CHAMBER INITIAL INSERT; Surgeon: Isaias Cowman, MD; Location: [facility] ORS; Service: Cardiovascular; Laterality: N/A;  . PORTA CATH INSERTION N/A 11/03/2017   Procedure: PORTA CATH INSERTION; Surgeon: Katha Cabal, MD; Location: [facility]; Service: Cardiovascular; Laterality: N/A;    Social History Social History        Tobacco Use  . Smoking status: Former Research scientist (life sciences)  . Smokeless tobacco: Current User    Types: Chew  Substance Use Topics  . Alcohol use: Yes    Alcohol/week: 1.8 oz    Types: 3 Cans  of beer per week    Comment: nightly  . Drug use: No     Family History      Family History  Problem Relation Age of Onset  . Breast cancer Mother   . Diabetes Father   . Cancer Father    Lymphoma  . Alcohol abuse Maternal Uncle   . Diabetes Sister   . Heart disease Unknown    grandfather         Allergies  Allergen Reactions  . Rofecoxib Nausea Only     REVIEW OF SYSTEMS(Negative unless checked)  Constitutional: [] Weight loss[] Fever[] Chills Cardiac:[] Chest pain[] Chest pressure[] Palpitations [] Shortness of breath when laying flat [] Shortness of breath at rest [x] Shortness of breath with exertion. Vascular: [] Pain in legs with walking[] Pain in legsat rest[] Pain in legs when laying flat [] Claudication [] Pain in feet when walking [] Pain in feet at rest [] Pain in feet when laying flat [] History of DVT [] Phlebitis [x] Swelling in legs [] Varicose veins [x] Non-healing ulcers Pulmonary: [] Uses home oxygen [] Productive cough[] Hemoptysis [] Wheeze [] COPD [] Asthma Neurologic: [] Dizziness [] Blackouts [] Seizures [] History of stroke [] History of TIA[] Aphasia [] Temporary blindness[] Dysphagia [] Weaknessor numbness in arms [] Weakness or numbnessin legs Musculoskeletal: [] Arthritis [] Joint swelling [] Joint pain [] Low back pain Hematologic:[] Easy bruising[] Easy bleeding [] Hypercoagulable state [] Anemic  Gastrointestinal:[] Blood in stool[] Vomiting blood[] Gastroesophageal reflux/heartburn[] Abdominal pain Genitourinary: [] Chronic kidney disease [] Difficulturination [] Frequenturination [] Burning with urination[] Hematuria Skin: [] Rashes [x] Ulcers [x] Wounds Psychological: [] History of anxiety[] History of major depression.      Physical Examination  BP 104/76 (BP Location: Right Arm, Patient Position: Sitting)   Pulse 63   Resp 18   Ht 5'  10" (1.778 m)   Wt 224 lb (101.6 kg)   BMI 32.14 kg/m  Gen:  WD/WN, NAD.  Appears older than stated age Head: [place]/AT, No temporalis wasting. Ear/Nose/Throat: Hearing grossly intact, nares w/o erythema or drainage Eyes: Conjunctiva clear. Sclera non-icteric Neck: Supple.  Trachea midline Pulmonary:  Good air movement, no use of accessory muscles.  Cardiac: RRR, no JVD Vascular:  Vessel Right Left  Radial Palpable Palpable                          PT  not palpable  trace palpable  DP  1+ palpable  1+ palpable   Musculoskeletal: M/S 5/5 throughout.  No deformity or atrophy.  Superficial ulcerations with clear serous fluid weeping from right lateral lower leg.  These are small.  1-2+ bilateral lower extremity edema edema. Neurologic: Sensation grossly intact in extremities.  Symmetrical.  Speech is fluent.  Psychiatric: Judgment intact, Mood & affect appropriate for pt's clinical situation. Dermatologic: Weeping from right lateral lower leg as above       Labs Recent Results (from the past 2160 hour(s))  Glucose, capillary     Status: Abnormal  Collection Time: 12/11/17 12:08 PM  Result Value Ref Range   Glucose-Capillary 211 (H) 65 - 99 mg/dL  Comprehensive metabolic panel     Status: Abnormal   Collection Time: 12/23/17  2:05 PM  Result Value Ref Range   Sodium 137 135 - 145 mmol/L   Potassium 3.8 3.5 - 5.1 mmol/L   Chloride 106 101 - 111 mmol/L   CO2 27 22 - 32 mmol/L   Glucose, Bld 191 (H) 65 - 99 mg/dL   BUN 16 6 - 20 mg/dL   Creatinine, Ser 1.05 0.61 - 1.24 mg/dL   Calcium 8.2 (L) 8.9 - 10.3 mg/dL   Total Protein 6.0 (L) 6.5 - 8.1 g/dL   Albumin 2.9 (L) 3.5 - 5.0 g/dL   AST 24 15 - 41 U/L   ALT 13 (L) 17 - 63 U/L   Alkaline Phosphatase 150 (H) 38 - 126 U/L   Total Bilirubin 1.1 0.3 - 1.2 mg/dL   GFR calc non Af Amer >60 >60 mL/min   GFR calc Af Amer >60 >60 mL/min    Comment: (NOTE) The eGFR has been calculated using the CKD EPI equation. This calculation  has not been validated in all clinical situations. eGFR's persistently <60 mL/min signify possible Chronic Kidney Disease.    Anion gap 4 (L) 5 - 15    Comment: Performed at [facility], [address] [place], [place] [address]  CBC with Differential     Status: Abnormal   Collection Time: 12/23/17  2:05 PM  Result Value Ref Range   WBC 3.0 (L) 3.8 - 10.6 K/uL   RBC 3.38 (L) 4.40 - 5.90 MIL/uL   Hemoglobin 10.5 (L) 13.0 - 18.0 g/dL   HCT 30.4 (L) 40.0 - 52.0 %   MCV 89.7 80.0 - 100.0 fL   MCH 31.1 26.0 - 34.0 pg   MCHC 34.7 32.0 - 36.0 g/dL   RDW 17.9 (H) 11.5 - 14.5 %   Platelets 124 (L) 150 - 440 K/uL   Neutrophils Relative % 49 %   Neutro Abs 1.5 1.4 - 6.5 K/uL   Lymphocytes Relative 31 %   Lymphs Abs 0.9 (L) 1.0 - 3.6 K/uL   Monocytes Relative 14 %   Monocytes Absolute 0.4 0.2 - 1.0 K/uL   Eosinophils Relative 5 %   Eosinophils Absolute 0.1 0 - 0.7 K/uL   Basophils Relative 1 %   Basophils Absolute 0.0 0 - 0.1 K/uL    Comment: Performed at [facility], [address] [place], [place] [address]  Comprehensive metabolic panel     Status: Abnormal   Collection Time: 01/05/18  8:50 AM  Result Value Ref Range   Sodium 138 135 - 145 mmol/L   Potassium 3.8 3.5 - 5.1 mmol/L   Chloride 112 (H) 101 - 111 mmol/L   CO2 18 (L) 22 - 32 mmol/L   Glucose, Bld 193 (H) 65 - 99 mg/dL   BUN 26 (H) 6 - 20 mg/dL   Creatinine, Ser 1.28 (H) 0.61 - 1.24 mg/dL   Calcium 8.3 (L) 8.9 - 10.3 mg/dL   Total Protein 6.3 (L) 6.5 - 8.1 g/dL   Albumin 3.0 (L) 3.5 - 5.0 g/dL   AST 27 15 - 41 U/L   ALT 15 (L) 17 - 63 U/L   Alkaline Phosphatase 150 (H) 38 - 126 U/L   Total Bilirubin 0.7 0.3 - 1.2 mg/dL   GFR calc non Af Amer 55 (L) >60 mL/min  GFR calc Af Amer >60 >60 mL/min    Comment: (NOTE) The eGFR has been calculated using the CKD EPI equation. This calculation has not been validated in all clinical situations. eGFR's persistently <60 mL/min signify possible Chronic  Kidney Disease.    Anion gap 8 5 - 15    Comment: Performed at [facility], [address] [place], [place] [address]  CBC with Differential     Status: Abnormal   Collection Time: 01/05/18  8:50 AM  Result Value Ref Range   WBC 4.9 3.8 - 10.6 K/uL   RBC 3.54 (L) 4.40 - 5.90 MIL/uL   Hemoglobin 11.2 (L) 13.0 - 18.0 g/dL   HCT 32.2 (L) 40.0 - 52.0 %   MCV 91.2 80.0 - 100.0 fL   MCH 31.7 26.0 - 34.0 pg   MCHC 34.7 32.0 - 36.0 g/dL   RDW 18.1 (H) 11.5 - 14.5 %   Platelets 159 150 - 440 K/uL   Neutrophils Relative % 56 %   Neutro Abs 2.7 1.4 - 6.5 K/uL   Lymphocytes Relative 28 %   Lymphs Abs 1.4 1.0 - 3.6 K/uL   Monocytes Relative 13 %   Monocytes Absolute 0.6 0.2 - 1.0 K/uL   Eosinophils Relative 3 %   Eosinophils Absolute 0.1 0 - 0.7 K/uL   Basophils Relative 0 %   Basophils Absolute 0.0 0 - 0.1 K/uL    Comment: Performed at [facility], [address] [place], [place] [address]  Comprehensive metabolic panel     Status: Abnormal   Collection Time: 02/01/18  8:35 AM  Result Value Ref Range   Sodium 139 135 - 145 mmol/L   Potassium 4.1 3.5 - 5.1 mmol/L   Chloride 112 (H) 101 - 111 mmol/L   CO2 18 (L) 22 - 32 mmol/L   Glucose, Bld 170 (H) 65 - 99 mg/dL   BUN 24 (H) 6 - 20 mg/dL   Creatinine, Ser 1.52 (H) 0.61 - 1.24 mg/dL   Calcium 8.1 (L) 8.9 - 10.3 mg/dL   Total Protein 5.7 (L) 6.5 - 8.1 g/dL   Albumin 3.0 (L) 3.5 - 5.0 g/dL   AST 21 15 - 41 U/L   ALT 12 (L) 17 - 63 U/L   Alkaline Phosphatase 101 38 - 126 U/L   Total Bilirubin 0.9 0.3 - 1.2 mg/dL   GFR calc non Af Amer 45 (L) >60 mL/min   GFR calc Af Amer 52 (L) >60 mL/min    Comment: (NOTE) The eGFR has been calculated using the CKD EPI equation. This calculation has not been validated in all clinical situations. eGFR's persistently <60 mL/min signify possible Chronic Kidney Disease.    Anion gap 9 5 - 15    Comment: Performed at [facility], [address] [place],  [address]   CBC with Differential     Status: Abnormal   Collection Time: 02/01/18  8:35 AM  Result Value Ref Range   WBC 3.6 (L) 3.8 - 10.6 K/uL   RBC 3.14 (L) 4.40 - 5.90 MIL/uL   Hemoglobin 10.1 (L) 13.0 - 18.0 g/dL   HCT 28.2 (L) 40.0 - 52.0 %   MCV 89.9 80.0 - 100.0 fL   MCH 32.1 26.0 - 34.0 pg   MCHC 35.7 32.0 - 36.0 g/dL   RDW 16.2 (H) 11.5 - 14.5 %   Platelets 77 (L) 150 - 440 K/uL   Neutrophils Relative % 55 %   Neutro Abs  2.0 1.4 - 6.5 K/uL   Lymphocytes Relative 24 %   Lymphs Abs 0.9 (L) 1.0 - 3.6 K/uL   Monocytes Relative 15 %   Monocytes Absolute 0.5 0.2 - 1.0 K/uL   Eosinophils Relative 4 %   Eosinophils Absolute 0.1 0 - 0.7 K/uL   Basophils Relative 2 %   Basophils Absolute 0.1 0 - 0.1 K/uL    Comment: Performed at [facility], [address] [place], [place] [address]  CBC with Differential     Status: Abnormal   Collection Time: 02/08/18  8:05 AM  Result Value Ref Range   WBC 3.9 3.8 - 10.6 K/uL   RBC 3.04 (L) 4.40 - 5.90 MIL/uL   Hemoglobin 9.9 (L) 13.0 - 18.0 g/dL   HCT 27.4 (L) 40.0 - 52.0 %   MCV 90.1 80.0 - 100.0 fL   MCH 32.4 26.0 - 34.0 pg   MCHC 36.0 32.0 - 36.0 g/dL   RDW 15.8 (H) 11.5 - 14.5 %   Platelets 92 (L) 150 - 440 K/uL   Neutrophils Relative % 58 %   Neutro Abs 2.3 1.4 - 6.5 K/uL   Lymphocytes Relative 25 %   Lymphs Abs 1.0 1.0 - 3.6 K/uL   Monocytes Relative 10 %   Monocytes Absolute 0.4 0.2 - 1.0 K/uL   Eosinophils Relative 5 %   Eosinophils Absolute 0.2 0 - 0.7 K/uL   Basophils Relative 2 %   Basophils Absolute 0.1 0 - 0.1 K/uL    Comment: Performed at [facility], [address] [place], [place] [address]  Comprehensive metabolic panel     Status: Abnormal   Collection Time: 02/08/18  8:05 AM  Result Value Ref Range   Sodium 141 135 - 145 mmol/L   Potassium 3.6 3.5 - 5.1 mmol/L   Chloride 113 (H) 101 - 111 mmol/L   CO2 19 (L) 22 - 32 mmol/L   Glucose, Bld 109 (H) 65 - 99 mg/dL   BUN 23 (H) 6 - 20 mg/dL   Creatinine,  Ser 1.62 (H) 0.61 - 1.24 mg/dL   Calcium 8.5 (L) 8.9 - 10.3 mg/dL   Total Protein 5.8 (L) 6.5 - 8.1 g/dL   Albumin 2.9 (L) 3.5 - 5.0 g/dL   AST 23 15 - 41 U/L   ALT 12 (L) 17 - 63 U/L   Alkaline Phosphatase 102 38 - 126 U/L   Total Bilirubin 0.8 0.3 - 1.2 mg/dL   GFR calc non Af Amer 42 (L) >60 mL/min   GFR calc Af Amer 48 (L) >60 mL/min    Comment: (NOTE) The eGFR has been calculated using the CKD EPI equation. This calculation has not been validated in all clinical situations. eGFR's persistently <60 mL/min signify possible Chronic Kidney Disease.    Anion gap 9 5 - 15    Comment: Performed at [facility], [address] [place], [place] [address]  Comprehensive metabolic panel     Status: Abnormal   Collection Time: 02/22/18 12:43 PM  Result Value Ref Range   Sodium 137 135 - 145 mmol/L   Potassium 3.6 3.5 - 5.1 mmol/L   Chloride 110 101 - 111 mmol/L   CO2 19 (L) 22 - 32 mmol/L   Glucose, Bld 118 (H) 65 - 99 mg/dL   BUN 18 6 - 20 mg/dL   Creatinine, Ser 1.25 (H) 0.61 - 1.24 mg/dL   Calcium 8.2 (L) 8.9 - 10.3 mg/dL   Total Protein  6.1 (L) 6.5 - 8.1 g/dL   Albumin 3.2 (L) 3.5 - 5.0 g/dL   AST 30 15 - 41 U/L   ALT 18 17 - 63 U/L   Alkaline Phosphatase 109 38 - 126 U/L   Total Bilirubin 0.9 0.3 - 1.2 mg/dL   GFR calc non Af Amer 57 (L) >60 mL/min   GFR calc Af Amer >60 >60 mL/min    Comment: (NOTE) The eGFR has been calculated using the CKD EPI equation. This calculation has not been validated in all clinical situations. eGFR's persistently <60 mL/min signify possible Chronic Kidney Disease.    Anion gap 8 5 - 15    Comment: Performed at [facility], [address] [place], [place] [address]  CBC with Differential     Status: Abnormal   Collection Time: 02/22/18 12:43 PM  Result Value Ref Range   WBC 0.4 (LL) 3.8 - 10.6 K/uL    Comment: RESULT REPEATED AND VERIFIED [facility]ER CENTER CRITICAL VALUE PROTOCOL    RBC 2.94 (L) 4.40 - 5.90 MIL/uL    Hemoglobin 9.6 (L) 13.0 - 18.0 g/dL   HCT 27.2 (L) 40.0 - 52.0 %   MCV 92.3 80.0 - 100.0 fL   MCH 32.6 26.0 - 34.0 pg   MCHC 35.3 32.0 - 36.0 g/dL   RDW 16.0 (H) 11.5 - 14.5 %   Platelets 101 (L) 150 - 440 K/uL   Neutrophils Relative % 53 %   Neutro Abs 0.2 (L) 1.4 - 6.5 K/uL    Comment: RESULT REPEATED AND VERIFIED CRITICAL RESULT CALLED TO, READ BACK BY AND VERIFIED WITH: HEATHER JONES @ 2:46PM 02/22/2018 [facility]    Lymphocytes Relative 13 %   Lymphs Abs 0.0 (L) 1.0 - 3.6 K/uL   Monocytes Relative 16 %   Monocytes Absolute 0.1 (L) 0.2 - 1.0 K/uL   Eosinophils Relative 13 %   Eosinophils Absolute 0.0 0 - 0.7 K/uL   Basophils Relative 5 %   Basophils Absolute 0.0 0 - 0.1 K/uL   WBC Morphology TOO FEW TO COUNT, SMEAR AVAILABLE FOR REVIEW     Comment: Performed at [facility], [address] [place], [place] [address]  Comprehensive metabolic panel     Status: Abnormal   Collection Time: 03/08/18  8:20 AM  Result Value Ref Range   Sodium 136 135 - 145 mmol/L   Potassium 3.4 (L) 3.5 - 5.1 mmol/L   Chloride 105 101 - 111 mmol/L   CO2 19 (L) 22 - 32 mmol/L   Glucose, Bld 267 (H) 65 - 99 mg/dL   BUN 27 (H) 6 - 20 mg/dL   Creatinine, Ser 1.49 (H) 0.61 - 1.24 mg/dL   Calcium 8.4 (L) 8.9 - 10.3 mg/dL   Total Protein 6.0 (L) 6.5 - 8.1 g/dL   Albumin 2.9 (L) 3.5 - 5.0 g/dL   AST 28 15 - 41 U/L   ALT 13 (L) 17 - 63 U/L   Alkaline Phosphatase 85 38 - 126 U/L   Total Bilirubin 0.7 0.3 - 1.2 mg/dL   GFR calc non Af Amer 46 (L) >60 mL/min   GFR calc Af Amer 53 (L) >60 mL/min    Comment: (NOTE) The eGFR has been calculated using the CKD EPI equation. This calculation has not been validated in all clinical situations. eGFR's persistently <60 mL/min signify possible Chronic Kidney Disease.    Anion gap 12 5 - 15    Comment: Performed at [facility], [address] [place], [place] [address]  CBC with Differential     Status: Abnormal   Collection Time: 03/08/18  8:20 AM   Result Value Ref Range   WBC 7.3 3.8 - 10.6 K/uL   RBC 3.23 (L) 4.40 - 5.90 MIL/uL   Hemoglobin 10.6 (L) 13.0 - 18.0 g/dL   HCT 29.3 (L) 40.0 - 52.0 %   MCV 90.9 80.0 - 100.0 fL   MCH 32.7 26.0 - 34.0 pg   MCHC 36.0 32.0 - 36.0 g/dL   RDW 15.7 (H) 11.5 - 14.5 %   Platelets 147 (L) 150 - 440 K/uL   Neutrophils Relative % 60 %   Neutro Abs 4.5 1.4 - 6.5 K/uL   Lymphocytes Relative 24 %   Lymphs Abs 1.7 1.0 - 3.6 K/uL   Monocytes Relative 9 %   Monocytes Absolute 0.6 0.2 - 1.0 K/uL   Eosinophils Relative 6 %   Eosinophils Absolute 0.4 0 - 0.7 K/uL   Basophils Relative 1 %   Basophils Absolute 0.1 0 - 0.1 K/uL    Comment: Performed at [facility], [address] [place], [place] [address]    Radiology No results found.  Assessment/Plan Essential hypertension, benign blood pressure control important in reducing the progression of atherosclerotic disease. On appropriate oral medications.   Chronic systolic CHF (congestive heart failure) (HCC) Likely exacerbating lower extremity swelling. Should discuss with primary care physician   Diabetes ([place]) blood glucose control important in reducing the progression of atherosclerotic disease. Also, involved in wound healing. On appropriate medications.   Lymphedema He does have a lymphedema pump at home and I think he should start using this regularly.  This should help with swelling.  Bilateral leg edema Slightly better but still some superficial ulcerations on the right.  I think we should continue Unna boots for several more weeks.  A 3 layer Unna boot was placed on both lower extremities today and will be changed weekly.  Recheck in the office in about a month.    Leotis Pain, MD  03/11/2018 10:58 AM    This note was created with Dragon medical transcription system.  Any errors from dictation are purely unintentional

## 2018-03-11 NOTE — Assessment & Plan Note (Signed)
Slightly better but still some superficial ulcerations on the right.  I think we should continue Unna boots for several more weeks.  A 3 layer Unna boot was placed on both lower extremities today and will be changed weekly.  Recheck in the office in about a month.

## 2018-03-18 ENCOUNTER — Ambulatory Visit (INDEPENDENT_AMBULATORY_CARE_PROVIDER_SITE_OTHER): Payer: Medicare Other | Admitting: Vascular Surgery

## 2018-03-18 ENCOUNTER — Encounter (INDEPENDENT_AMBULATORY_CARE_PROVIDER_SITE_OTHER): Payer: Self-pay

## 2018-03-18 VITALS — BP 107/62 | HR 87 | Resp 17 | Ht 70.0 in | Wt 224.0 lb

## 2018-03-18 DIAGNOSIS — I2510 Atherosclerotic heart disease of native coronary artery without angina pectoris: Secondary | ICD-10-CM | POA: Diagnosis not present

## 2018-03-18 DIAGNOSIS — I890 Lymphedema, not elsewhere classified: Secondary | ICD-10-CM | POA: Diagnosis not present

## 2018-03-18 NOTE — Progress Notes (Signed)
History of Present Illness  There is no documented history at this time  Assessments & Plan   There are no diagnoses linked to this encounter.    Additional instructions  Subjective:  Patient presents with venous ulcer of the Bilateral lower extremity.    Procedure:  3 layer unna wrap was placed Bilateral lower extremity.   Plan:   Follow up in one week.  

## 2018-03-25 ENCOUNTER — Encounter (INDEPENDENT_AMBULATORY_CARE_PROVIDER_SITE_OTHER): Payer: Self-pay | Admitting: Vascular Surgery

## 2018-03-25 ENCOUNTER — Ambulatory Visit (INDEPENDENT_AMBULATORY_CARE_PROVIDER_SITE_OTHER): Payer: Medicare Other | Admitting: Vascular Surgery

## 2018-03-25 VITALS — BP 136/86 | HR 63 | Resp 18 | Ht 70.0 in | Wt 225.0 lb

## 2018-03-25 DIAGNOSIS — R600 Localized edema: Secondary | ICD-10-CM

## 2018-03-25 DIAGNOSIS — I890 Lymphedema, not elsewhere classified: Secondary | ICD-10-CM

## 2018-03-25 NOTE — Progress Notes (Signed)
History of Present Illness  There is no documented history at this time  Assessments & Plan   There are no diagnoses linked to this encounter.    Additional instructions  Subjective:  Patient presents with venous ulcer of the Bilateral lower extremity.    Procedure:  3 layer unna wrap was placed Bilateral lower extremity.   Plan:   Follow up in one week.  

## 2018-03-26 ENCOUNTER — Other Ambulatory Visit: Payer: Self-pay | Admitting: Oncology

## 2018-03-26 DIAGNOSIS — C8318 Mantle cell lymphoma, lymph nodes of multiple sites: Secondary | ICD-10-CM

## 2018-04-01 ENCOUNTER — Ambulatory Visit (INDEPENDENT_AMBULATORY_CARE_PROVIDER_SITE_OTHER): Payer: Medicare Other | Admitting: Vascular Surgery

## 2018-04-01 ENCOUNTER — Ambulatory Visit
Admission: RE | Admit: 2018-04-01 | Discharge: 2018-04-01 | Disposition: A | Discharge status: Home / Self Care | Payer: Medicare Other | Source: Ambulatory Visit | Attending: Oncology | Admitting: Oncology

## 2018-04-01 ENCOUNTER — Encounter (INDEPENDENT_AMBULATORY_CARE_PROVIDER_SITE_OTHER): Payer: Self-pay

## 2018-04-01 VITALS — BP 109/63 | HR 94 | Resp 18 | Ht 70.0 in | Wt 231.0 lb

## 2018-04-01 DIAGNOSIS — R918 Other nonspecific abnormal finding of lung field: Secondary | ICD-10-CM | POA: Insufficient documentation

## 2018-04-01 DIAGNOSIS — I2510 Atherosclerotic heart disease of native coronary artery without angina pectoris: Secondary | ICD-10-CM | POA: Diagnosis not present

## 2018-04-01 DIAGNOSIS — I890 Lymphedema, not elsewhere classified: Secondary | ICD-10-CM | POA: Diagnosis not present

## 2018-04-01 DIAGNOSIS — I700 Atherosclerosis of aorta: Secondary | ICD-10-CM | POA: Diagnosis not present

## 2018-04-01 DIAGNOSIS — C8318 Mantle cell lymphoma, lymph nodes of multiple sites: Secondary | ICD-10-CM | POA: Diagnosis not present

## 2018-04-01 DIAGNOSIS — C8590 Non-Hodgkin lymphoma, unspecified, unspecified site: Secondary | ICD-10-CM | POA: Diagnosis not present

## 2018-04-01 MED ORDER — IOPAMIDOL (ISOVUE-300) INJECTION 61%
80.0000 mL | Freq: Once | INTRAVENOUS | Status: AC | PRN
Start: 2018-04-01 — End: 2018-04-01
  Administered 2018-04-01: 80 mL via INTRAVENOUS

## 2018-04-01 NOTE — Progress Notes (Signed)
History of Present Illness  There is no documented history at this time  Assessments & Plan   There are no diagnoses linked to this encounter.    Additional instructions  Subjective:  Patient presents with venous ulcer of the Bilateral lower extremity.    Procedure:  3 layer unna wrap was placed Bilateral lower extremity.   Plan:   Follow up in one week.  

## 2018-04-04 ENCOUNTER — Ambulatory Visit (INDEPENDENT_AMBULATORY_CARE_PROVIDER_SITE_OTHER): Payer: Medicare Other | Admitting: Podiatry

## 2018-04-04 ENCOUNTER — Encounter: Payer: Self-pay | Admitting: Podiatry

## 2018-04-04 DIAGNOSIS — M79674 Pain in right toe(s): Secondary | ICD-10-CM

## 2018-04-04 DIAGNOSIS — B351 Tinea unguium: Secondary | ICD-10-CM | POA: Diagnosis not present

## 2018-04-04 DIAGNOSIS — M79675 Pain in left toe(s): Secondary | ICD-10-CM | POA: Diagnosis not present

## 2018-04-04 DIAGNOSIS — E1142 Type 2 diabetes mellitus with diabetic polyneuropathy: Secondary | ICD-10-CM

## 2018-04-04 NOTE — Progress Notes (Addendum)
Complaint:  Visit Type: Patient returns to my office for continued preventative foot care services. Complaint: Patient states" my nails have grown long and thick and become painful to walk and wear shoes" Patient has been diagnosed with DM with no foot complications. Patient is wearing unna boots on both legs and feet. The patient presents for preventative foot care services. No changes to ROS  Podiatric Exam: Vascular: deferred this visit.  Sensorium: Normal Semmes Weinstein monofilament test. Normal tactile sensation bilaterally. Nail Exam: Pt has thick disfigured discolored nails with subungual debris noted bilateral entire nail hallux through fifth toenails Ulcer Exam: There is no evidence of ulcer or pre-ulcerative changes or infection. Orthopedic Exam: Muscle tone and strength are WNL. No limitations in general ROM. No crepitus or effusions noted. Foot type and digits show no abnormalities. Bony prominences are unremarkable. Skin: No Porokeratosis. No infection or ulcers  Diagnosis:  Onychomycosis, , Pain in right toe, pain in left toes  Treatment & Plan Procedures and Treatment: Consent by patient was obtained for treatment procedures.   Debridement of mycotic and hypertrophic toenails, 1 through 5 bilateral and clearing of subungual debris. No ulceration, no infection noted. ABN signed for 2019. Return Visit-Office Procedure: Patient instructed to return to the office for a follow up visit 3 months for continued evaluation and treatment.    [name] DPM

## 2018-04-04 NOTE — Progress Notes (Signed)
[facility]  [phone] [phone]  ID: Gary [name] OB: 1945-11-24  MR#: [id number]  JJK#:[id number]  Patient Care Team: [name], MD as PCP - General (Internal Medicine)  CHIEF COMPLAINT: Mantle cell lymphoma.  INTERVAL HISTORY: Patient is a clinic today for further evaluation, discussion of his imaging results and whether or not to continue treatment.  He currently feels well and is asymptomatic.  He does not complain of weakness or fatigue today.  He denies any fevers, chills, or night sweats. He has a good appetite and denies weight loss. He has no neurologic complaints. He denies any chest pain or shortness of breath. He has no nausea, vomiting, constipation, or diarrhea.  He denies any melena or hematochezia.  He has no urinary complaints.  Patient offers no specific complaints today.  REVIEW OF SYSTEMS:   Review of Systems  Constitutional: Negative for diaphoresis, fever, malaise/fatigue and weight loss.  Respiratory: Negative.  Negative for cough and shortness of breath.   Cardiovascular: Positive for leg swelling. Negative for chest pain and palpitations.  Gastrointestinal: Negative.  Negative for abdominal pain, blood in stool and melena.  Genitourinary: Negative.  Negative for hematuria.  Musculoskeletal: Negative.  Negative for falls.  Skin: Negative.  Negative for rash.  Neurological: Negative.  Negative for sensory change, focal weakness and weakness.  Endo/Heme/Allergies: Does not bruise/bleed easily.  Psychiatric/Behavioral: Negative.  The patient is not nervous/anxious.     As per HPI. Otherwise, a complete review of systems is negative.  PAST MEDICAL HISTORY: Past Medical History:  Diagnosis Date  . Depression   . 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 Gastroparesis   . Graves disease   . Hypercholesterolemia   . Hypertension   . Mantle cell lymphoma ([place])     . Peripheral neuropathy     PAST SURGICAL HISTORY: Past Surgical History:  Procedure Laterality Date  . CHOLECYSTECTOMY    . NEPHRECTOMY     right  . PACEMAKER INSERTION N/A 12/07/2017   Procedure: INSERTION PACEMAKER DUAL CHAMBER INITIAL INSERT;  Surgeon: Isaias Cowman, MD;  Location: [facility];  Service: Cardiovascular;  Laterality: N/A;  . PORTA CATH INSERTION N/A 11/03/2017   Procedure: PORTA CATH INSERTION;  Surgeon: Katha Cabal, MD;  Location: [facility];  Service: Cardiovascular;  Laterality: N/A;    FAMILY HISTORY Family History  Problem Relation Age of Onset  . Breast cancer Mother   . Diabetes Father   . Cancer Father        Lymphoma  . Alcohol abuse Maternal Uncle   . Diabetes Sister   . Heart disease Unknown        grandfather       ADVANCED DIRECTIVES:    HEALTH MAINTENANCE: Social History   Tobacco Use  . Smoking status: Former Research scientist (life sciences)  . Smokeless tobacco: Current User    Types: Chew  Substance Use Topics  . Alcohol use: Yes    Alcohol/week: 1.8 oz    Types: 3 Cans of beer per week    Comment: nightly  . Drug use: No     Colonoscopy:  PAP:  Bone density:  Lipid panel:  Allergies  Allergen Reactions  . Rofecoxib Nausea Only    Current Outpatient Medications  Medication Sig Dispense Refill  . ACCU-CHEK AVIVA PLUS test strip USE AS INSTRUCTED. USE 1 STRIP EVERY 3 HOURS . ACCU-CHECK AVIVA PLUS TEST STRIP. DX E10.649  12  .  acyclovir (ZOVIRAX) 400 MG tablet TAKE 1 TABLET BY MOUTH EVERY DAY 30 tablet 3  . carvedilol (COREG) 3.125 MG tablet Take 3.125 mg by mouth 2 (two) times daily with a meal.     . ferrous sulfate 325 (65 FE) MG tablet Take 325 mg by mouth daily.    . furosemide (LASIX) 20 MG tablet Take 1 tablet (20 mg total) by mouth daily. 30 tablet 1  . GLUCAGON EMERGENCY 1 MG injection     . glucose 4 GM chewable tablet Chew 1 tablet by mouth once as needed for low blood sugar.    Marland Kitchen glucose blood (PRECISION QID  TEST) test strip Use 8 (eight) times daily as directed. BAYER CONTOUR NEXT TEST STRIPS E10.649    . insulin aspart (NOVOLOG) 100 UNIT/ML injection Basal rate 12am 0.8 units per hour, 9am 0.9 units per hour. (total basal insulin 20.7 units). Carbohydrate ratio 1 units for every 8gm of carbohydrate. Correction factor 1 units for every 50mg /dl over target cbg. Target CBG 80-120 1 vial 12  . levothyroxine (SYNTHROID, LEVOTHROID) 25 MCG tablet Take 25 mcg by mouth daily.    Marland Kitchen lidocaine-prilocaine (EMLA) cream Apply 1 application topically as needed. Apply to port prior to chemotherapy appointment, cover with plastic wrap. 30 g 2  . lisinopril (PRINIVIL,ZESTRIL) 20 MG tablet Take 20 mg by mouth daily.    . Multiple Vitamin (MULTIVITAMIN) tablet Take 1 tablet by mouth daily.    . naproxen sodium (ALEVE) 220 MG tablet Take 220 mg by mouth daily as needed.    . pravastatin (PRAVACHOL) 40 MG tablet Take 1 tablet (40 mg total) by mouth at bedtime. Will need office visit for any more refills 30 tablet 0  . spironolactone (ALDACTONE) 25 MG tablet Take 25 mg by mouth daily.     No current facility-administered medications for this visit.    Facility-Administered Medications Ordered in Other Visits  Medication Dose Route Frequency Provider Last Rate Last Dose  . heparin lock flush 100 unit/mL  500 Units Intravenous Once Lloyd Huger, MD      . sodium chloride flush (NS) 0.9 % injection 10 mL  10 mL Intravenous PRN Lloyd Huger, MD   10 mL at 02/01/18 0829  . sodium chloride flush (NS) 0.9 % injection 10 mL  10 mL Intravenous PRN Lloyd Huger, MD   10 mL at 04/05/18 0800  Race: Please given the multiple things to do this would like to do a little bit for myself before do anything  OBJECTIVE: Vitals:   04/05/18 0849  BP: 116/70  Pulse: 82  Resp: 18  Temp: (!) 97.2 F (36.2 C)     Body mass index is 33.61 kg/m.    ECOG FS:0 - Asymptomatic  General: Well-developed, well-nourished, no  acute distress. Eyes: Pink conjunctiva, anicteric sclera. Chest wall: Pacemaker and port in place. Lungs: Clear to auscultation bilaterally. Heart: Regular rate and rhythm. No rubs, murmurs, or gallops. Abdomen: Soft, nontender, nondistended. No organomegaly noted, normoactive bowel sounds. Musculoskeletal: No edema, cyanosis, or clubbing. Neuro: Alert, answering all questions appropriately. Cranial nerves grossly intact. Skin: No rashes or petechiae noted. Psych: Normal affect. Lymphatics: No cervical, calvicular, axillary or inguinal LAD.  LAB RESULTS:  Lab Results  Component Value Date   NA 140 04/05/2018   K 3.9 04/05/2018   CL 114 (H) 04/05/2018   CO2 21 (L) 04/05/2018   GLUCOSE 120 (H) 04/05/2018   BUN 21 04/05/2018   CREATININE 1.32 (H)  04/05/2018   CALCIUM 8.0 (L) 04/05/2018   PROT 5.5 (L) 04/05/2018   ALBUMIN 2.9 (L) 04/05/2018   AST 26 04/05/2018   ALT 11 04/05/2018   ALKPHOS 99 04/05/2018   BILITOT 0.3 04/05/2018   GFRNONAA 53 (L) 04/05/2018   GFRAA >60 04/05/2018    Lab Results  Component Value Date   WBC 3.6 (L) 04/05/2018   NEUTROABS 1.1 (L) 04/05/2018   HGB 9.7 (L) 04/05/2018   HCT 27.4 (L) 04/05/2018   MCV 94.2 04/05/2018   PLT 61 (L) 04/05/2018   Lab Results  Component Value Date   IRON 13 (L) 08/11/2017   TIBC 223 (L) 01/12/2017   IRONPCTSAT 3.8 (L) 08/11/2017   Lab Results  Component Value Date   FERRITIN 42.0 08/11/2017     STUDIES:   ASSESSMENT: Low-grade mantle cell lymphoma diagnosed in September 2015, iron deficiency anemia.  PLAN:    1. Mantle cell lymphoma, stage III: Patient's initial diagnosis was on inguinal lymph node biopsy on July 12, 2014.  CT scan results from April 01, 2018 reviewed independently and report as above with significant improvement in patient's lymphadenopathy and disease burden.  Given his persistent thrombocytopenia and improved imaging, will discontinue treatment at this time.  No further intervention is  needed.  Return to clinic in 3 months with repeat imaging and further evaluation. 2. Decreased ejection fraction: Chronic and unchanged.  Previously patient had an ejection fraction of 40% with no clinical CHF.  Continue monitoring and evaluation per cardiology. 3. Diabetes: Patient has improved blood glucose control. Continue insulin pump as directed. 4. Thrombocytopenia: Platelet count 61 today.  Discontinue treatment as above. 5. Hypertension: Patient's blood pressure was within normal limits today. 6.  Iron deficiency anemia: Patient's hemoglobin has trended down slightly, monitor.  He last received IV Feraheme on September 28, 2017.  Will consider additional IV Feraheme in the future if necessary.   7.  Bradycardia: Patient has pacemaker in place. 8.  Renal insufficiency: Patient creatinine remains mildly elevated, but approximately his baseline.  Monitor.  Patient expressed understanding and was in agreement with this plan. He also understands that He can call clinic at any time with any questions, concerns, or complaints.    Lloyd Huger, MD   04/05/2018 10:32 AM

## 2018-04-05 ENCOUNTER — Inpatient Hospital Stay: Payer: Medicare Other

## 2018-04-05 ENCOUNTER — Inpatient Hospital Stay (HOSPITAL_BASED_OUTPATIENT_CLINIC_OR_DEPARTMENT_OTHER): Payer: Medicare Other | Admitting: Oncology

## 2018-04-05 ENCOUNTER — Inpatient Hospital Stay: Payer: Medicare Other | Attending: Oncology

## 2018-04-05 VITALS — BP 116/70 | HR 82 | Temp 97.2°F | Resp 18 | Wt 234.2 lb

## 2018-04-05 DIAGNOSIS — R001 Bradycardia, unspecified: Secondary | ICD-10-CM

## 2018-04-05 DIAGNOSIS — R600 Localized edema: Secondary | ICD-10-CM | POA: Insufficient documentation

## 2018-04-05 DIAGNOSIS — I2510 Atherosclerotic heart disease of native coronary artery without angina pectoris: Secondary | ICD-10-CM

## 2018-04-05 DIAGNOSIS — N289 Disorder of kidney and ureter, unspecified: Secondary | ICD-10-CM | POA: Insufficient documentation

## 2018-04-05 DIAGNOSIS — E7800 Pure hypercholesterolemia, unspecified: Secondary | ICD-10-CM | POA: Insufficient documentation

## 2018-04-05 DIAGNOSIS — D696 Thrombocytopenia, unspecified: Secondary | ICD-10-CM

## 2018-04-05 DIAGNOSIS — Z950 Presence of cardiac pacemaker: Secondary | ICD-10-CM | POA: Insufficient documentation

## 2018-04-05 DIAGNOSIS — Z87891 Personal history of nicotine dependence: Secondary | ICD-10-CM | POA: Insufficient documentation

## 2018-04-05 DIAGNOSIS — E0500 Thyrotoxicosis with diffuse goiter without thyrotoxic crisis or storm: Secondary | ICD-10-CM

## 2018-04-05 DIAGNOSIS — C8318 Mantle cell lymphoma, lymph nodes of multiple sites: Secondary | ICD-10-CM

## 2018-04-05 DIAGNOSIS — D509 Iron deficiency anemia, unspecified: Secondary | ICD-10-CM | POA: Insufficient documentation

## 2018-04-05 DIAGNOSIS — Z452 Encounter for adjustment and management of vascular access device: Secondary | ICD-10-CM | POA: Insufficient documentation

## 2018-04-05 DIAGNOSIS — Z794 Long term (current) use of insulin: Secondary | ICD-10-CM

## 2018-04-05 DIAGNOSIS — E119 Type 2 diabetes mellitus without complications: Secondary | ICD-10-CM

## 2018-04-05 DIAGNOSIS — C8310 Mantle cell lymphoma, unspecified site: Secondary | ICD-10-CM | POA: Diagnosis not present

## 2018-04-05 DIAGNOSIS — I10 Essential (primary) hypertension: Secondary | ICD-10-CM | POA: Insufficient documentation

## 2018-04-05 DIAGNOSIS — Z79899 Other long term (current) drug therapy: Secondary | ICD-10-CM

## 2018-04-05 LAB — COMPREHENSIVE METABOLIC PANEL
ALK PHOS: 99 U/L (ref 38–126)
ALT: 11 U/L (ref 0–44)
ANION GAP: 5 (ref 5–15)
AST: 26 U/L (ref 15–41)
Albumin: 2.9 g/dL — ABNORMAL LOW (ref 3.5–5.0)
BILIRUBIN TOTAL: 0.3 mg/dL (ref 0.3–1.2)
BUN: 21 mg/dL (ref 8–23)
CALCIUM: 8 mg/dL — AB (ref 8.9–10.3)
CO2: 21 mmol/L — ABNORMAL LOW (ref 22–32)
CREATININE: 1.32 mg/dL — AB (ref 0.61–1.24)
Chloride: 114 mmol/L — ABNORMAL HIGH (ref 98–111)
GFR calc Af Amer: >60 mL/min (ref >60)
GFR calc non Af Amer: 53 mL/min — ABNORMAL LOW (ref >60)
GLUCOSE: 120 mg/dL — AB (ref 70–99)
Potassium: 3.9 mmol/L (ref 3.5–5.1)
Sodium: 140 mmol/L (ref 135–145)
TOTAL PROTEIN: 5.5 g/dL — AB (ref 6.5–8.1)

## 2018-04-05 LAB — CBC WITH DIFFERENTIAL/PLATELET
BASOS ABS: 0.1 10*3/uL (ref 0–0.1)
BASOS PCT: 2 %
EOS ABS: 0.2 10*3/uL (ref 0–0.7)
EOS PCT: 6 %
HCT: 27.4 % — ABNORMAL LOW (ref 40.0–52.0)
Hemoglobin: 9.7 g/dL — ABNORMAL LOW (ref 13.0–18.0)
Lymphocytes Relative: 46 %
Lymphs Abs: 1.7 10*3/uL (ref 1.0–3.6)
MCH: 33.5 pg (ref 26.0–34.0)
MCHC: 35.6 g/dL (ref 32.0–36.0)
MCV: 94.2 fL (ref 80.0–100.0)
MONO ABS: 0.5 10*3/uL (ref 0.2–1.0)
Monocytes Relative: 14 %
NEUTROS ABS: 1.1 10*3/uL — AB (ref 1.4–6.5)
Neutrophils Relative %: 32 %
PLATELETS: 61 10*3/uL — AB (ref 150–440)
RBC: 2.9 MIL/uL — ABNORMAL LOW (ref 4.40–5.90)
RDW: 16.8 % — AB (ref 11.5–14.5)
WBC: 3.6 10*3/uL — ABNORMAL LOW (ref 3.8–10.6)

## 2018-04-05 MED ORDER — SODIUM CHLORIDE 0.9% FLUSH
10.0000 mL | INTRAVENOUS | Status: AC | PRN
  Administered 2018-04-05: 10 mL via INTRAVENOUS
  Filled 2018-04-05: qty 10

## 2018-04-05 MED ORDER — HEPARIN SOD (PORK) LOCK FLUSH 100 UNIT/ML IV SOLN
500.0000 [IU] | Freq: Once | INTRAVENOUS | Status: AC
  Administered 2018-04-05: 500 [IU] via INTRAVENOUS

## 2018-04-05 NOTE — Progress Notes (Signed)
Pt in for follow up and treatment.  Reports "real tired and worn out".  Appetite "alittle better".

## 2018-04-06 ENCOUNTER — Inpatient Hospital Stay: Payer: Medicare Other

## 2018-04-08 ENCOUNTER — Encounter (INDEPENDENT_AMBULATORY_CARE_PROVIDER_SITE_OTHER): Payer: Self-pay | Admitting: Vascular Surgery

## 2018-04-08 ENCOUNTER — Ambulatory Visit (INDEPENDENT_AMBULATORY_CARE_PROVIDER_SITE_OTHER): Payer: Medicare Other | Admitting: Vascular Surgery

## 2018-04-08 VITALS — BP 113/67 | HR 89 | Resp 16 | Ht 70.0 in | Wt 235.0 lb

## 2018-04-08 DIAGNOSIS — I2510 Atherosclerotic heart disease of native coronary artery without angina pectoris: Secondary | ICD-10-CM | POA: Diagnosis not present

## 2018-04-08 DIAGNOSIS — I890 Lymphedema, not elsewhere classified: Secondary | ICD-10-CM | POA: Diagnosis not present

## 2018-04-08 DIAGNOSIS — E7800 Pure hypercholesterolemia, unspecified: Secondary | ICD-10-CM

## 2018-04-08 NOTE — Progress Notes (Signed)
MRN : [id number]  Gary [name] is a 72 y.o. ([birth year]) male who presents with chief complaint of  Chief Complaint  Patient presents with  . Follow-up    unna check  .  History of Present Illness: Patient returns today in follow up of leg swelling.  He still has a fair bit of swelling bilaterally and weeping from the right leg despite several weeks of Unna boots.  No fevers or chills.  Not that painful.  Current Outpatient Medications  Medication Sig Dispense Refill  . ACCU-CHEK AVIVA PLUS test strip USE AS INSTRUCTED. USE 1 STRIP EVERY 3 HOURS . ACCU-CHECK AVIVA PLUS TEST STRIP. DX E10.649  12  . acyclovir (ZOVIRAX) 400 MG tablet TAKE 1 TABLET BY MOUTH EVERY DAY 30 tablet 3  . carvedilol (COREG) 3.125 MG tablet Take 3.125 mg by mouth 2 (two) times daily with a meal.     . ferrous sulfate 325 (65 FE) MG tablet Take 325 mg by mouth daily.    . furosemide (LASIX) 20 MG tablet Take 1 tablet (20 mg total) by mouth daily. 30 tablet 1  . GLUCAGON EMERGENCY 1 MG injection     . glucose 4 GM chewable tablet Chew 1 tablet by mouth once as needed for low blood sugar.    Marland Kitchen glucose blood (PRECISION QID TEST) test strip Use 8 (eight) times daily as directed. BAYER CONTOUR NEXT TEST STRIPS E10.649    . insulin aspart (NOVOLOG) 100 UNIT/ML injection Basal rate 12am 0.8 units per hour, 9am 0.9 units per hour. (total basal insulin 20.7 units). Carbohydrate ratio 1 units for every 8gm of carbohydrate. Correction factor 1 units for every 65m/dl over target cbg. Target CBG 80-120 1 vial 12  . levothyroxine (SYNTHROID, LEVOTHROID) 25 MCG tablet Take 25 mcg by mouth daily.    .Marland Kitchenlidocaine-prilocaine (EMLA) cream Apply 1 application topically as needed. Apply to port prior to chemotherapy appointment, cover with plastic wrap. 30 g 2  . lisinopril (PRINIVIL,ZESTRIL) 20 MG tablet Take 20 mg by mouth daily.    . Multiple Vitamin (MULTIVITAMIN) tablet Take 1 tablet by mouth daily.    . naproxen sodium (ALEVE)  220 MG tablet Take 220 mg by mouth daily as needed.    . pravastatin (PRAVACHOL) 40 MG tablet Take 1 tablet (40 mg total) by mouth at bedtime. Will need office visit for any more refills 30 tablet 0  . spironolactone (ALDACTONE) 25 MG tablet Take 25 mg by mouth daily.     No current facility-administered medications for this visit.    Facility-Administered Medications Ordered in Other Visits  Medication Dose Route Frequency Provider Last Rate Last Dose  . heparin lock flush 100 unit/mL  500 Units Intravenous Once FLloyd Huger MD      . sodium chloride flush (NS) 0.9 % injection 10 mL  10 mL Intravenous PRN FLloyd Huger MD   10 mL at 02/01/18 0829  . sodium chloride flush (NS) 0.9 % injection 10 mL  10 mL Intravenous PRN FLloyd Huger MD   10 mL at 04/05/18 0800    Past Medical History:  Diagnosis Date  . Depression   . 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 Gastroparesis   . Graves disease   . Hypercholesterolemia   . Hypertension   . Mantle cell lymphoma ([place]   . Peripheral neuropathy     Past Surgical History:  Procedure Laterality Date  .  CHOLECYSTECTOMY    . NEPHRECTOMY     right  . PACEMAKER INSERTION N/A 12/07/2017   Procedure: INSERTION PACEMAKER DUAL CHAMBER INITIAL INSERT;  Surgeon: Isaias Cowman, MD;  Location: [facility] OR[facility];  Service: Cardiovascular;  Laterality: N/A;  . PORTA CATH INSERTION N/A 11/03/2017   Procedure: PORTA CATH INSERTION;  Surgeon: Katha Cabal, MD;  Location: [facility];  Service: Cardiovascular;  Laterality: N/A;   Social History        Tobacco Use  . Smoking status: Former Research scientist (life sciences)  . Smokeless tobacco: Current User    Types: Chew  Substance Use Topics  . Alcohol use: Yes    Alcohol/week: 1.8 oz    Types: 3 Cans of beer per week    Comment: nightly  . Drug use: No     Family History      Family History  Problem  Relation Age of Onset  . Breast cancer Mother   . Diabetes Father   . Cancer Father    Lymphoma  . Alcohol abuse Maternal Uncle   . Diabetes Sister   . Heart disease Unknown    grandfather         Allergies  Allergen Reactions  . Rofecoxib Nausea Only     REVIEW OF SYSTEMS(Negative unless checked)  Constitutional: _0 Weight loss_1 Fever_2 Chills Cardiac:_3 Chest pain_4 Chest pressure_5 Palpitations _6 Shortness of breath when laying flat _7 Shortness of breath at rest _8 Shortness of breath with exertion. Vascular: _9 Pain in legs with walking_10 Pain in legsat rest_11 Pain in legs when laying flat _12 Claudication _13 Pain in feet when walking _14 Pain in feet at rest _15 Pain in feet when laying flat _16 History of DVT _17 Phlebitis _18 Swelling in legs _19 Varicose veins _20 Non-healing ulcers Pulmonary: _21 Uses home oxygen _22 Productive cough_23 Hemoptysis _24 Wheeze _25 COPD _26 Asthma Neurologic: _27 Dizziness _28 Blackouts _29 Seizures _30 History of stroke _31 History of TIA_32 Aphasia _33 Temporary blindness_34 Dysphagia _35 Weaknessor numbness in arms _36 Weakness or numbnessin legs Musculoskeletal: _37 Arthritis _38 Joint swelling _39 Joint pain _40 Low back pain Hematologic:_41 Easy bruising_42 Easy bleeding _43 Hypercoagulable state _44 Anemic  Gastrointestinal:_45 Blood in stool_46 Vomiting blood_47 Gastroesophageal reflux/heartburn_48 Abdominal pain Genitourinary: _49 Chronic kidney disease _50 Difficulturination _51 Frequenturination _52 Burning with urination_53 Hematuria Skin: _54 Rashes _55 Ulcers _56 Wounds Psychological: _57 History of anxiety_58 History of major depression.    Physical Examination  BP 113/67 (BP Location: Right Arm)   Pulse 89   Resp 16   Ht _59  (1.778 m)   Wt 235 lb (106.6 kg)   BMI 33.72 kg/m  Gen:  WD/WN, NAD Head: [place]/AT, No temporalis wasting. Ear/Nose/Throat: Hearing grossly  intact, nares w/o erythema or drainage Eyes: Conjunctiva clear. Sclera non-icteric Neck: Supple.  Trachea midline Pulmonary:  Good air movement, no use of accessory muscles.  Cardiac: RRR, no JVD Vascular:  Vessel Right Left  Radial Palpable Palpable                                   Musculoskeletal: M/S 5/5 throughout.  No deformity or atrophy. 1-2+ BLE edema. Neurologic: Sensation grossly intact in extremities.  Symmetrical.  Speech is fluent.  Psychiatric: Judgment intact, Mood & affect appropriate for pt's clinical situation. Dermatologic: weeping superficial wounds on the right lateral leg       Labs Recent Results (from the past 2160 hour(s))  Comprehensive metabolic panel     Status: Abnormal   Collection Time: 02/01/18  8:35 AM  Result Value Ref Range   Sodium 139 135 - 145 mmol/L   Potassium 4.1 3.5 - 5.1 mmol/L   Chloride 112 (H) 101 - 111 mmol/L   CO2 18 (L) 22 -  32 mmol/L   Glucose, Bld 170 (H) 65 - 99 mg/dL   BUN 24 (H) 6 - 20 mg/dL   Creatinine, Ser 1.52 (H) 0.61 - 1.24 mg/dL   Calcium 8.1 (L) 8.9 - 10.3 mg/dL   Total Protein 5.7 (L) 6.5 - 8.1 g/dL   Albumin 3.0 (L) 3.5 - 5.0 g/dL   AST 21 15 - 41 U/L   ALT 12 (L) 17 - 63 U/L   Alkaline Phosphatase 101 38 - 126 U/L   Total Bilirubin 0.9 0.3 - 1.2 mg/dL   GFR calc non Af Amer 45 (L) >60 mL/min   GFR calc Af Amer 52 (L) >60 mL/min    Comment: (NOTE) The eGFR has been calculated using the CKD EPI equation. This calculation has not been validated in all clinical situations. eGFR's persistently <60 mL/min signify possible Chronic Kidney Disease.    Anion gap 9 5 - 15    Comment: Performed at [facility], [address] [place], [place] [address]  CBC with Differential     Status: Abnormal   Collection Time: 02/01/18  8:35 AM  Result Value Ref Range   WBC 3.6 (L) 3.8 - 10.6 K/uL   RBC 3.14 (L) 4.40 - 5.90 MIL/uL   Hemoglobin 10.1 (L) 13.0 - 18.0 g/dL   HCT 28.2 (L) 40.0 - 52.0 %   MCV  89.9 80.0 - 100.0 fL   MCH 32.1 26.0 - 34.0 pg   MCHC 35.7 32.0 - 36.0 g/dL   RDW 16.2 (H) 11.5 - 14.5 %   Platelets 77 (L) 150 - 440 K/uL   Neutrophils Relative % 55 %   Neutro Abs 2.0 1.4 - 6.5 K/uL   Lymphocytes Relative 24 %   Lymphs Abs 0.9 (L) 1.0 - 3.6 K/uL   Monocytes Relative 15 %   Monocytes Absolute 0.5 0.2 - 1.0 K/uL   Eosinophils Relative 4 %   Eosinophils Absolute 0.1 0 - 0.7 K/uL   Basophils Relative 2 %   Basophils Absolute 0.1 0 - 0.1 K/uL    Comment: Performed at [facility], [address] [place], [place] [address]  CBC with Differential     Status: Abnormal   Collection Time: 02/08/18  8:05 AM  Result Value Ref Range   WBC 3.9 3.8 - 10.6 K/uL   RBC 3.04 (L) 4.40 - 5.90 MIL/uL   Hemoglobin 9.9 (L) 13.0 - 18.0 g/dL   HCT 27.4 (L) 40.0 - 52.0 %   MCV 90.1 80.0 - 100.0 fL   MCH 32.4 26.0 - 34.0 pg   MCHC 36.0 32.0 - 36.0 g/dL   RDW 15.8 (H) 11.5 - 14.5 %   Platelets 92 (L) 150 - 440 K/uL   Neutrophils Relative % 58 %   Neutro Abs 2.3 1.4 - 6.5 K/uL   Lymphocytes Relative 25 %   Lymphs Abs 1.0 1.0 - 3.6 K/uL   Monocytes Relative 10 %   Monocytes Absolute 0.4 0.2 - 1.0 K/uL   Eosinophils Relative 5 %   Eosinophils Absolute 0.2 0 - 0.7 K/uL   Basophils Relative 2 %   Basophils Absolute 0.1 0 - 0.1 K/uL    Comment: Performed at [facility], [address] [place], [place] [address]  Comprehensive metabolic panel     Status: Abnormal   Collection Time: 02/08/18  8:05 AM  Result Value Ref Range   Sodium 141 135 - 145 mmol/L   Potassium 3.6 3.5 - 5.1 mmol/L  Chloride 113 (H) 101 - 111 mmol/L   CO2 19 (L) 22 - 32 mmol/L   Glucose, Bld 109 (H) 65 - 99 mg/dL   BUN 23 (H) 6 - 20 mg/dL   Creatinine, Ser 1.62 (H) 0.61 - 1.24 mg/dL   Calcium 8.5 (L) 8.9 - 10.3 mg/dL   Total Protein 5.8 (L) 6.5 - 8.1 g/dL   Albumin 2.9 (L) 3.5 - 5.0 g/dL   AST 23 15 - 41 U/L   ALT 12 (L) 17 - 63 U/L   Alkaline Phosphatase 102 38 - 126 U/L   Total Bilirubin  0.8 0.3 - 1.2 mg/dL   GFR calc non Af Amer 42 (L) >60 mL/min   GFR calc Af Amer 48 (L) >60 mL/min    Comment: (NOTE) The eGFR has been calculated using the CKD EPI equation. This calculation has not been validated in all clinical situations. eGFR's persistently <60 mL/min signify possible Chronic Kidney Disease.    Anion gap 9 5 - 15    Comment: Performed at [facility], [address] [place], [place] [address]  Comprehensive metabolic panel     Status: Abnormal   Collection Time: 02/22/18 12:43 PM  Result Value Ref Range   Sodium 137 135 - 145 mmol/L   Potassium 3.6 3.5 - 5.1 mmol/L   Chloride 110 101 - 111 mmol/L   CO2 19 (L) 22 - 32 mmol/L   Glucose, Bld 118 (H) 65 - 99 mg/dL   BUN 18 6 - 20 mg/dL   Creatinine, Ser 1.25 (H) 0.61 - 1.24 mg/dL   Calcium 8.2 (L) 8.9 - 10.3 mg/dL   Total Protein 6.1 (L) 6.5 - 8.1 g/dL   Albumin 3.2 (L) 3.5 - 5.0 g/dL   AST 30 15 - 41 U/L   ALT 18 17 - 63 U/L   Alkaline Phosphatase 109 38 - 126 U/L   Total Bilirubin 0.9 0.3 - 1.2 mg/dL   GFR calc non Af Amer 57 (L) >60 mL/min   GFR calc Af Amer >60 >60 mL/min    Comment: (NOTE) The eGFR has been calculated using the CKD EPI equation. This calculation has not been validated in all clinical situations. eGFR's persistently <60 mL/min signify possible Chronic Kidney Disease.    Anion gap 8 5 - 15    Comment: Performed at [facility], [address] [place], [place] [address]  CBC with Differential     Status: Abnormal   Collection Time: 02/22/18 12:43 PM  Result Value Ref Range   WBC 0.4 (LL) 3.8 - 10.6 K/uL    Comment: RESULT REPEATED AND VERIFIED [facility]ENTER CRITICAL VALUE PROTOCOL    RBC 2.94 (L) 4.40 - 5.90 MIL/uL   Hemoglobin 9.6 (L) 13.0 - 18.0 g/dL   HCT 27.2 (L) 40.0 - 52.0 %   MCV 92.3 80.0 - 100.0 fL   MCH 32.6 26.0 - 34.0 pg   MCHC 35.3 32.0 - 36.0 g/dL   RDW 16.0 (H) 11.5 - 14.5 %   Platelets 101 (L) 150 - 440 K/uL   Neutrophils Relative % 53 %   Neutro  Abs 0.2 (L) 1.4 - 6.5 K/uL    Comment: RESULT REPEATED AND VERIFIED CRITICAL RESULT CALLED TO, READ BACK BY AND VERIFIED WITH: HEATHER JONES @ 2:46PM 02/22/2018 LGR    Lymphocytes Relative 13 %   Lymphs Abs 0.0 (L) 1.0 - 3.6 K/uL   Monocytes Relative 16 %   Monocytes Absolute 0.1 (L) 0.2 -  1.0 K/uL   Eosinophils Relative 13 %   Eosinophils Absolute 0.0 0 - 0.7 K/uL   Basophils Relative 5 %   Basophils Absolute 0.0 0 - 0.1 K/uL   WBC Morphology TOO FEW TO COUNT, SMEAR AVAILABLE FOR REVIEW     Comment: Performed at [facility], [address] [place], [place] [address]  Comprehensive metabolic panel     Status: Abnormal   Collection Time: 03/08/18  8:20 AM  Result Value Ref Range   Sodium 136 135 - 145 mmol/L   Potassium 3.4 (L) 3.5 - 5.1 mmol/L   Chloride 105 101 - 111 mmol/L   CO2 19 (L) 22 - 32 mmol/L   Glucose, Bld 267 (H) 65 - 99 mg/dL   BUN 27 (H) 6 - 20 mg/dL   Creatinine, Ser 1.49 (H) 0.61 - 1.24 mg/dL   Calcium 8.4 (L) 8.9 - 10.3 mg/dL   Total Protein 6.0 (L) 6.5 - 8.1 g/dL   Albumin 2.9 (L) 3.5 - 5.0 g/dL   AST 28 15 - 41 U/L   ALT 13 (L) 17 - 63 U/L   Alkaline Phosphatase 85 38 - 126 U/L   Total Bilirubin 0.7 0.3 - 1.2 mg/dL   GFR calc non Af Amer 46 (L) >60 mL/min   GFR calc Af Amer 53 (L) >60 mL/min    Comment: (NOTE) The eGFR has been calculated using the CKD EPI equation. This calculation has not been validated in all clinical situations. eGFR's persistently <60 mL/min signify possible Chronic Kidney Disease.    Anion gap 12 5 - 15    Comment: Performed at [facility], [address] [place], [place] [address]  CBC with Differential     Status: Abnormal   Collection Time: 03/08/18  8:20 AM  Result Value Ref Range   WBC 7.3 3.8 - 10.6 K/uL   RBC 3.23 (L) 4.40 - 5.90 MIL/uL   Hemoglobin 10.6 (L) 13.0 - 18.0 g/dL   HCT 29.3 (L) 40.0 - 52.0 %   MCV 90.9 80.0 - 100.0 fL   MCH 32.7 26.0 - 34.0 pg   MCHC 36.0 32.0 - 36.0 g/dL   RDW 15.7 (H)  11.5 - 14.5 %   Platelets 147 (L) 150 - 440 K/uL   Neutrophils Relative % 60 %   Neutro Abs 4.5 1.4 - 6.5 K/uL   Lymphocytes Relative 24 %   Lymphs Abs 1.7 1.0 - 3.6 K/uL   Monocytes Relative 9 %   Monocytes Absolute 0.6 0.2 - 1.0 K/uL   Eosinophils Relative 6 %   Eosinophils Absolute 0.4 0 - 0.7 K/uL   Basophils Relative 1 %   Basophils Absolute 0.1 0 - 0.1 K/uL    Comment: Performed at [facility], [address] [place], [place] [address]  Comprehensive metabolic panel     Status: Abnormal   Collection Time: 04/05/18  7:54 AM  Result Value Ref Range   Sodium 140 135 - 145 mmol/L   Potassium 3.9 3.5 - 5.1 mmol/L   Chloride 114 (H) 98 - 111 mmol/L   CO2 21 (L) 22 - 32 mmol/L   Glucose, Bld 120 (H) 70 - 99 mg/dL   BUN 21 8 - 23 mg/dL   Creatinine, Ser 1.32 (H) 0.61 - 1.24 mg/dL   Calcium 8.0 (L) 8.9 - 10.3 mg/dL   Total Protein 5.5 (L) 6.5 - 8.1 g/dL   Albumin 2.9 (L) 3.5 - 5.0 g/dL   AST 26  15 - 41 U/L   ALT 11 0 - 44 U/L   Alkaline Phosphatase 99 38 - 126 U/L   Total Bilirubin 0.3 0.3 - 1.2 mg/dL   GFR calc non Af Amer 53 (L) >60 mL/min   GFR calc Af Amer >60 >60 mL/min    Comment: (NOTE) The eGFR has been calculated using the CKD EPI equation. This calculation has not been validated in all clinical situations. eGFR's persistently <60 mL/min signify possible Chronic Kidney Disease.    Anion gap 5 5 - 15    Comment: Performed at [facility], [address] [place], [place] [address]  CBC with Differential     Status: Abnormal   Collection Time: 04/05/18  7:54 AM  Result Value Ref Range   WBC 3.6 (L) 3.8 - 10.6 K/uL   RBC 2.90 (L) 4.40 - 5.90 MIL/uL   Hemoglobin 9.7 (L) 13.0 - 18.0 g/dL   HCT 27.4 (L) 40.0 - 52.0 %   MCV 94.2 80.0 - 100.0 fL   MCH 33.5 26.0 - 34.0 pg   MCHC 35.6 32.0 - 36.0 g/dL   RDW 16.8 (H) 11.5 - 14.5 %   Platelets 61 (L) 150 - 440 K/uL   Neutrophils Relative % 32 %   Neutro Abs 1.1 (L) 1.4 - 6.5 K/uL   Lymphocytes Relative  46 %   Lymphs Abs 1.7 1.0 - 3.6 K/uL   Monocytes Relative 14 %   Monocytes Absolute 0.5 0.2 - 1.0 K/uL   Eosinophils Relative 6 %   Eosinophils Absolute 0.2 0 - 0.7 K/uL   Basophils Relative 2 %   Basophils Absolute 0.1 0 - 0.1 K/uL    Comment: Performed at [facility], [address], [place], [place] [address]    Radiology Ct Chest W Contrast  Result Date: 04/01/2018 CLINICAL DATA:  Followup lymphoma. EXAM: CT CHEST, ABDOMEN, AND PELVIS WITH CONTRAST TECHNIQUE: Multidetector CT imaging of the chest, abdomen and pelvis was performed following the standard protocol during bolus administration of intravenous contrast. CONTRAST:  51m ISOVUE-300 IOPAMIDOL (ISOVUE-300) INJECTION 61% COMPARISON:  01/03/2018. FINDINGS: CT CHEST FINDINGS Cardiovascular: Normal heart size. Aortic atherosclerosis. Calcification within the RCA, and LAD coronary artery noted. Mediastinum/Nodes: Normal appearance of the thyroid gland. The trachea appears patent and is midline. The index right retropectoral node measures 4 mm, image 20/2. Previously 7 mm. The previous index right paratracheal node measures 6 mm, image 26/2. Previously 9 mm. Previous index right hilar node measures 7 mm, image 30/2. Previously 11 mm. Lungs/Pleura: No pleural effusion. Mild changes of emphysema. Unchanged appearance of 5 mm left upper lobe lung nodule, image 47/4. Right upper lobe lung nodule is stable measuring 5 mm, image 55/4. The right lower lobe lung nodule described on previous exam has resolved. Musculoskeletal: Chronic appearing right proximal humeral neck fracture. Chronic left posterior rib fracture deformities noted. CT ABDOMEN PELVIS FINDINGS Hepatobiliary: No focal liver abnormality identified. Previous cholecystectomy. No biliary dilatation. Pancreas: Unremarkable. No pancreatic ductal dilatation or surrounding inflammatory changes. Spleen: Normal in size without focal abnormality. Adrenals/Urinary Tract: The adrenal glands are  normal. The right kidney is absent. Unremarkable appearance of the left kidney. Stable cyst arising from the inferior pole of left kidney measuring 1 cm. Urinary bladder is unremarkable. Stomach/Bowel: Stomach is within normal limits. No evidence of bowel wall thickening, distention, or inflammatory changes. Vascular/Lymphatic: Aortic atherosclerosis without aneurysm. Index left retroperitoneal periaortic node measures 9 mm, image 85/2. Previously 1.2 cm. The index left common  iliac lymph node measures 1.6 cm, image 92/2. Previously 2.4 cm. The index left common iliac lymph node measures 1.4 cm, image 107/2. Previously 2.3 cm. The index right inguinal node measures 1.3 cm, image 132/2. Previously 1.8 cm. Reproductive: Prostate is unremarkable. Other: There is no ascites. No free fluid or fluid collections identified. Musculoskeletal: No suspicious bone lesions identified. IMPRESSION: 1. Continued interval decrease in size of lymph nodes within the chest, abdomen and pelvis as detailed above. 2. Stable small pulmonary nodules. 3.  Aortic Atherosclerosis (ICD10-I70.0). 4. RCA and LAD coronary artery atherosclerotic calcifications. Electronically Signed   By: Kerby Moors M.D.   On: 04/01/2018 12:56   Ct Abdomen Pelvis W Contrast  Result Date: 04/01/2018 CLINICAL DATA:  Followup lymphoma. EXAM: CT CHEST, ABDOMEN, AND PELVIS WITH CONTRAST TECHNIQUE: Multidetector CT imaging of the chest, abdomen and pelvis was performed following the standard protocol during bolus administration of intravenous contrast. CONTRAST:  73m ISOVUE-300 IOPAMIDOL (ISOVUE-300) INJECTION 61% COMPARISON:  01/03/2018. FINDINGS: CT CHEST FINDINGS Cardiovascular: Normal heart size. Aortic atherosclerosis. Calcification within the RCA, and LAD coronary artery noted. Mediastinum/Nodes: Normal appearance of the thyroid gland. The trachea appears patent and is midline. The index right retropectoral node measures 4 mm, image 20/2. Previously 7 mm.  The previous index right paratracheal node measures 6 mm, image 26/2. Previously 9 mm. Previous index right hilar node measures 7 mm, image 30/2. Previously 11 mm. Lungs/Pleura: No pleural effusion. Mild changes of emphysema. Unchanged appearance of 5 mm left upper lobe lung nodule, image 47/4. Right upper lobe lung nodule is stable measuring 5 mm, image 55/4. The right lower lobe lung nodule described on previous exam has resolved. Musculoskeletal: Chronic appearing right proximal humeral neck fracture. Chronic left posterior rib fracture deformities noted. CT ABDOMEN PELVIS FINDINGS Hepatobiliary: No focal liver abnormality identified. Previous cholecystectomy. No biliary dilatation. Pancreas: Unremarkable. No pancreatic ductal dilatation or surrounding inflammatory changes. Spleen: Normal in size without focal abnormality. Adrenals/Urinary Tract: The adrenal glands are normal. The right kidney is absent. Unremarkable appearance of the left kidney. Stable cyst arising from the inferior pole of left kidney measuring 1 cm. Urinary bladder is unremarkable. Stomach/Bowel: Stomach is within normal limits. No evidence of bowel wall thickening, distention, or inflammatory changes. Vascular/Lymphatic: Aortic atherosclerosis without aneurysm. Index left retroperitoneal periaortic node measures 9 mm, image 85/2. Previously 1.2 cm. The index left common iliac lymph node measures 1.6 cm, image 92/2. Previously 2.4 cm. The index left common iliac lymph node measures 1.4 cm, image 107/2. Previously 2.3 cm. The index right inguinal node measures 1.3 cm, image 132/2. Previously 1.8 cm. Reproductive: Prostate is unremarkable. Other: There is no ascites. No free fluid or fluid collections identified. Musculoskeletal: No suspicious bone lesions identified. IMPRESSION: 1. Continued interval decrease in size of lymph nodes within the chest, abdomen and pelvis as detailed above. 2. Stable small pulmonary nodules. 3.  Aortic  Atherosclerosis (ICD10-I70.0). 4. RCA and LAD coronary artery atherosclerotic calcifications. Electronically Signed   By: TKerby MoorsM.D.   On: 04/01/2018 12:56    Assessment/Plan Essential hypertension, benign blood pressure control important in reducing the progression of atherosclerotic disease. On appropriate oral medications.   Chronic systolic CHF (congestive heart failure) (HCC) Likely exacerbating lower extremity swelling. Should discuss with primary care physician   Diabetes ([place] blood glucose control important in reducing the progression of atherosclerotic disease. Also, involved in wound healing. On appropriate medications.   Hypercholesterolemia lipid control important in reducing the progression of atherosclerotic disease. Continue statin  therapy   Lymphedema The patient has not had significant improvement and we will need to continue Unna boots for several more weeks.  A 3 layer Unna boot was placed bilaterally today.  Continue to recommend elevating his legs and increasing his activity.    Leotis Pain, MD  04/08/2018 2:04 PM    This note was created with Dragon medical transcription system.  Any errors from dictation are purely unintentional

## 2018-04-08 NOTE — Assessment & Plan Note (Signed)
lipid control important in reducing the progression of atherosclerotic disease. Continue statin therapy  

## 2018-04-08 NOTE — Assessment & Plan Note (Signed)
The patient has not had significant improvement and we will need to continue Unna boots for several more weeks.  A 3 layer Unna boot was placed bilaterally today.  Continue to recommend elevating his legs and increasing his activity.

## 2018-04-15 ENCOUNTER — Ambulatory Visit (INDEPENDENT_AMBULATORY_CARE_PROVIDER_SITE_OTHER): Payer: Medicare Other | Admitting: Vascular Surgery

## 2018-04-15 ENCOUNTER — Encounter (INDEPENDENT_AMBULATORY_CARE_PROVIDER_SITE_OTHER): Payer: Self-pay

## 2018-04-15 VITALS — BP 117/68 | HR 74 | Resp 18 | Ht 70.0 in | Wt 229.0 lb

## 2018-04-15 DIAGNOSIS — I890 Lymphedema, not elsewhere classified: Secondary | ICD-10-CM | POA: Diagnosis not present

## 2018-04-15 DIAGNOSIS — I2510 Atherosclerotic heart disease of native coronary artery without angina pectoris: Secondary | ICD-10-CM | POA: Diagnosis not present

## 2018-04-15 NOTE — Progress Notes (Signed)
History of Present Illness  There is no documented history at this time  Assessments & Plan   There are no diagnoses linked to this encounter.    Additional instructions  Subjective:  Patient presents with venous ulcer of the Bilateral lower extremity.    Procedure:  3 layer unna wrap was placed Bilateral lower extremity.   Plan:   Follow up in one week.  

## 2018-04-22 ENCOUNTER — Ambulatory Visit (INDEPENDENT_AMBULATORY_CARE_PROVIDER_SITE_OTHER): Payer: Medicare Other | Admitting: Vascular Surgery

## 2018-04-22 ENCOUNTER — Encounter (INDEPENDENT_AMBULATORY_CARE_PROVIDER_SITE_OTHER): Payer: Self-pay

## 2018-04-22 VITALS — BP 121/72 | HR 80 | Resp 18 | Ht 70.0 in | Wt 230.0 lb

## 2018-04-22 DIAGNOSIS — I2510 Atherosclerotic heart disease of native coronary artery without angina pectoris: Secondary | ICD-10-CM

## 2018-04-22 DIAGNOSIS — R600 Localized edema: Secondary | ICD-10-CM | POA: Diagnosis not present

## 2018-04-22 NOTE — Progress Notes (Signed)
History of Present Illness  There is no documented history at this time  Assessments & Plan   There are no diagnoses linked to this encounter.    Additional instructions  Subjective:  Patient presents with venous ulcer of the Bilateral lower extremity.    Procedure:  3 layer unna wrap was placed Bilateral lower extremity.   Plan:   Follow up in one week.  

## 2018-04-29 ENCOUNTER — Encounter (INDEPENDENT_AMBULATORY_CARE_PROVIDER_SITE_OTHER): Payer: Self-pay

## 2018-04-29 ENCOUNTER — Ambulatory Visit (INDEPENDENT_AMBULATORY_CARE_PROVIDER_SITE_OTHER): Payer: Medicare Other | Admitting: Nurse Practitioner

## 2018-04-29 VITALS — BP 108/57 | HR 73 | Resp 15 | Ht 70.0 in | Wt 234.0 lb

## 2018-04-29 DIAGNOSIS — R600 Localized edema: Secondary | ICD-10-CM | POA: Diagnosis not present

## 2018-04-29 NOTE — Progress Notes (Signed)
History of Present Illness  There is no documented history at this time  Assessments & Plan   There are no diagnoses linked to this encounter.    Additional instructions  Subjective:  Patient presents with venous ulcer of the Bilateral lower extremity.    Procedure:  3 layer unna wrap was placed Bilateral lower extremity.   Plan:   Follow up in one week.  

## 2018-05-06 ENCOUNTER — Encounter (INDEPENDENT_AMBULATORY_CARE_PROVIDER_SITE_OTHER): Payer: Self-pay | Admitting: Vascular Surgery

## 2018-05-06 ENCOUNTER — Ambulatory Visit (INDEPENDENT_AMBULATORY_CARE_PROVIDER_SITE_OTHER): Payer: Medicare Other | Admitting: Vascular Surgery

## 2018-05-06 VITALS — BP 138/74 | HR 62 | Resp 17 | Ht 70.0 in | Wt 238.0 lb

## 2018-05-06 DIAGNOSIS — I890 Lymphedema, not elsewhere classified: Secondary | ICD-10-CM | POA: Diagnosis not present

## 2018-05-06 DIAGNOSIS — I2510 Atherosclerotic heart disease of native coronary artery without angina pectoris: Secondary | ICD-10-CM | POA: Diagnosis not present

## 2018-05-06 DIAGNOSIS — E1151 Type 2 diabetes mellitus with diabetic peripheral angiopathy without gangrene: Secondary | ICD-10-CM | POA: Diagnosis not present

## 2018-05-06 DIAGNOSIS — E7800 Pure hypercholesterolemia, unspecified: Secondary | ICD-10-CM | POA: Diagnosis not present

## 2018-05-06 DIAGNOSIS — I5022 Chronic systolic (congestive) heart failure: Secondary | ICD-10-CM | POA: Diagnosis not present

## 2018-05-06 DIAGNOSIS — I10 Essential (primary) hypertension: Secondary | ICD-10-CM | POA: Diagnosis not present

## 2018-05-06 NOTE — Progress Notes (Signed)
MRN : [id number]  Gary [name] is a 72 y.o. (November 11, 1945) male who presents with chief complaint of  Chief Complaint  Patient presents with  . Follow-up    Unna check  .  History of Present Illness: Patient returns today in follow up of his leg swelling.  His left leg is not weeping anymore at this point but does still have some swelling.  He still has some shallow ulcerations and weeping from the right lateral lower leg they have not yet gotten compression stockings.  Current Outpatient Medications  Medication Sig Dispense Refill  . ACCU-CHEK AVIVA PLUS test strip USE AS INSTRUCTED. USE 1 STRIP EVERY 3 HOURS . ACCU-CHECK AVIVA PLUS TEST STRIP. DX E10.649  12  . acyclovir (ZOVIRAX) 400 MG tablet TAKE 1 TABLET BY MOUTH EVERY DAY 30 tablet 3  . carvedilol (COREG) 3.125 MG tablet Take 3.125 mg by mouth 2 (two) times daily with a meal.     . ferrous sulfate 325 (65 FE) MG tablet Take 325 mg by mouth daily.    . furosemide (LASIX) 20 MG tablet Take 1 tablet (20 mg total) by mouth daily. 30 tablet 1  . GLUCAGON EMERGENCY 1 MG injection     . glucose 4 GM chewable tablet Chew 1 tablet by mouth once as needed for low blood sugar.    Marland Kitchen glucose blood (PRECISION QID TEST) test strip Use 8 (eight) times daily as directed. BAYER CONTOUR NEXT TEST STRIPS E10.649    . insulin aspart (NOVOLOG) 100 UNIT/ML injection Basal rate 12am 0.8 units per hour, 9am 0.9 units per hour. (total basal insulin 20.7 units). Carbohydrate ratio 1 units for every 8gm of carbohydrate. Correction factor 1 units for every 27m/dl over target cbg. Target CBG 80-120 1 vial 12  . levothyroxine (SYNTHROID, LEVOTHROID) 25 MCG tablet Take 25 mcg by mouth daily.    .Marland Kitchenlidocaine-prilocaine (EMLA) cream Apply 1 application topically as needed. Apply to port prior to chemotherapy appointment, cover with plastic wrap. 30 g 2  . lisinopril (PRINIVIL,ZESTRIL) 20 MG tablet Take 20 mg by mouth daily.    . Multiple Vitamin  (MULTIVITAMIN) tablet Take 1 tablet by mouth daily.    . naproxen sodium (ALEVE) 220 MG tablet Take 220 mg by mouth daily as needed.    . pravastatin (PRAVACHOL) 40 MG tablet Take 1 tablet (40 mg total) by mouth at bedtime. Will need office visit for any more refills 30 tablet 0  . spironolactone (ALDACTONE) 25 MG tablet Take 25 mg by mouth daily.     No current facility-administered medications for this visit.             Facility-Administered Medications Ordered in Other Visits  Medication Dose Route Frequency Provider Last Rate Last Dose  . heparin lock flush 100 unit/mL  500 Units Intravenous Once FLloyd Huger MD      . sodium chloride flush (NS) 0.9 % injection 10 mL  10 mL Intravenous PRN FLloyd Huger MD   10 mL at 02/01/18 0829  . sodium chloride flush (NS) 0.9 % injection 10 mL  10 mL Intravenous PRN FLloyd Huger MD   10 mL at 04/05/18 0800        Past Medical History:  Diagnosis Date  . Depression   . 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 Gastroparesis   . Graves disease   . Hypercholesterolemia   . Hypertension   .  Mantle cell lymphoma ([place])   . Peripheral neuropathy          Past Surgical History:  Procedure Laterality Date  . CHOLECYSTECTOMY    . NEPHRECTOMY     right  . PACEMAKER INSERTION N/A 12/07/2017   Procedure: INSERTION PACEMAKER DUAL CHAMBER INITIAL INSERT;  Surgeon: Isaias Cowman, MD;  Location: [facility];  Service: Cardiovascular;  Laterality: N/A;  . PORTA CATH INSERTION N/A 11/03/2017   Procedure: PORTA CATH INSERTION;  Surgeon: Katha Cabal, MD;  Location: [facility] [place] [facility];  Service: Cardiovascular;  Laterality: N/A;   Social History        Tobacco Use  . Smoking status: Former Research scientist (life sciences)  . Smokeless tobacco: Current User    Types: Chew  Substance Use Topics  . Alcohol use: Yes    Alcohol/week: 1.8 oz    Types: 3  Cans of beer per week    Comment: nightly  . Drug use: No     Family History      Family History  Problem Relation Age of Onset  . Breast cancer Mother   . Diabetes Father   . Cancer Father    Lymphoma  . Alcohol abuse Maternal Uncle   . Diabetes Sister   . Heart disease Unknown    grandfather         Allergies  Allergen Reactions  . Rofecoxib Nausea Only     REVIEW OF SYSTEMS(Negative unless checked)  Constitutional: [] Weight loss[] Fever[] Chills Cardiac:[] Chest pain[] Chest pressure[] Palpitations [] Shortness of breath when laying flat [] Shortness of breath at rest [x] Shortness of breath with exertion. Vascular: [] Pain in legs with walking[] Pain in legsat rest[] Pain in legs when laying flat [] Claudication [] Pain in feet when walking [] Pain in feet at rest [] Pain in feet when laying flat [] History of DVT [] Phlebitis [x] Swelling in legs [] Varicose veins [x] Non-healing ulcers Pulmonary: [] Uses home oxygen [] Productive cough[] Hemoptysis [] Wheeze [] COPD [] Asthma Neurologic: [] Dizziness [] Blackouts [] Seizures [] History of stroke [] History of TIA[] Aphasia [] Temporary blindness[] Dysphagia [] Weaknessor numbness in arms [] Weakness or numbnessin legs Musculoskeletal: [] Arthritis [] Joint swelling [] Joint pain [] Low back pain Hematologic:[] Easy bruising[] Easy bleeding [] Hypercoagulable state [] Anemic  Gastrointestinal:[] Blood in stool[] Vomiting blood[] Gastroesophageal reflux/heartburn[] Abdominal pain Genitourinary: [] Chronic kidney disease [] Difficulturination [] Frequenturination [] Burning with urination[] Hematuria Skin: [] Rashes [x] Ulcers [x] Wounds Psychological: [] History of anxiety[] History of major depression.    Physical Examination  BP 138/74 (BP Location: Right Arm, Patient Position: Sitting)   Pulse 62   Resp 17   Ht 5'  10" (1.778 m)   Wt 238 lb (108 kg)   BMI 34.15 kg/m  Gen:  WD/WN, NAD Head: [place]/AT, No temporalis wasting. Ear/Nose/Throat: Hearing grossly intact, nares w/o erythema or drainage Eyes: Conjunctiva clear. Sclera non-icteric Neck: Supple.  Trachea midline Pulmonary:  Good air movement, no use of accessory muscles.  Cardiac: RRR, no JVD Vascular:  Vessel Right Left  Radial Palpable Palpable                          PT  not palpable  not palpable  DP  1+ palpable  1+ palpable   Musculoskeletal: M/S 5/5 throughout.  No deformity or atrophy.  1-2+ bilateral lower extremity edema. Neurologic: Sensation grossly intact in extremities.  Symmetrical.  Speech is fluent.  Psychiatric: Judgment intact, Mood & affect appropriate for pt's clinical situation. Dermatologic: Shallow weeping area from the right lateral lower leg.       Labs Recent Results (from the past 2160 hour(s))  CBC with Differential     Status: Abnormal   Collection  Time: 02/08/18  8:05 AM  Result Value Ref Range   WBC 3.9 3.8 - 10.6 K/uL   RBC 3.04 (L) 4.40 - 5.90 MIL/uL   Hemoglobin 9.9 (L) 13.0 - 18.0 g/dL   HCT 27.4 (L) 40.0 - 52.0 %   MCV 90.1 80.0 - 100.0 fL   MCH 32.4 26.0 - 34.0 pg   MCHC 36.0 32.0 - 36.0 g/dL   RDW 15.8 (H) 11.5 - 14.5 %   Platelets 92 (L) 150 - 440 K/uL   Neutrophils Relative % 58 %   Neutro Abs 2.3 1.4 - 6.5 K/uL   Lymphocytes Relative 25 %   Lymphs Abs 1.0 1.0 - 3.6 K/uL   Monocytes Relative 10 %   Monocytes Absolute 0.4 0.2 - 1.0 K/uL   Eosinophils Relative 5 %   Eosinophils Absolute 0.2 0 - 0.7 K/uL   Basophils Relative 2 %   Basophils Absolute 0.1 0 - 0.1 K/uL    Comment: Performed at [facility], [address] [place], [place] [address]  Comprehensive metabolic panel     Status: Abnormal   Collection Time: 02/08/18  8:05 AM  Result Value Ref Range   Sodium 141 135 - 145 mmol/L   Potassium 3.6 3.5 - 5.1 mmol/L   Chloride 113 (H) 101 - 111 mmol/L   CO2 19 (L)  22 - 32 mmol/L   Glucose, Bld 109 (H) 65 - 99 mg/dL   BUN 23 (H) 6 - 20 mg/dL   Creatinine, Ser 1.62 (H) 0.61 - 1.24 mg/dL   Calcium 8.5 (L) 8.9 - 10.3 mg/dL   Total Protein 5.8 (L) 6.5 - 8.1 g/dL   Albumin 2.9 (L) 3.5 - 5.0 g/dL   AST 23 15 - 41 U/L   ALT 12 (L) 17 - 63 U/L   Alkaline Phosphatase 102 38 - 126 U/L   Total Bilirubin 0.8 0.3 - 1.2 mg/dL   GFR calc non Af Amer 42 (L) >60 mL/min   GFR calc Af Amer 48 (L) >60 mL/min    Comment: (NOTE) The eGFR has been calculated using the CKD EPI equation. This calculation has not been validated in all clinical situations. eGFR's persistently <60 mL/min signify possible Chronic Kidney Disease.    Anion gap 9 5 - 15    Comment: Performed at [facility], [address] [place], [place] [address]  Comprehensive metabolic panel     Status: Abnormal   Collection Time: 02/22/18 12:43 PM  Result Value Ref Range   Sodium 137 135 - 145 mmol/L   Potassium 3.6 3.5 - 5.1 mmol/L   Chloride 110 101 - 111 mmol/L   CO2 19 (L) 22 - 32 mmol/L   Glucose, Bld 118 (H) 65 - 99 mg/dL   BUN 18 6 - 20 mg/dL   Creatinine, Ser 1.25 (H) 0.61 - 1.24 mg/dL   Calcium 8.2 (L) 8.9 - 10.3 mg/dL   Total Protein 6.1 (L) 6.5 - 8.1 g/dL   Albumin 3.2 (L) 3.5 - 5.0 g/dL   AST 30 15 - 41 U/L   ALT 18 17 - 63 U/L   Alkaline Phosphatase 109 38 - 126 U/L   Total Bilirubin 0.9 0.3 - 1.2 mg/dL   GFR calc non Af Amer 57 (L) >60 mL/min   GFR calc Af Amer >60 >60 mL/min    Comment: (NOTE) The eGFR has been calculated using the CKD EPI equation. This calculation has not been validated in all clinical situations. eGFR's  persistently <60 mL/min signify possible Chronic Kidney Disease.    Anion gap 8 5 - 15    Comment: Performed at [facility], [address] [place], [place] [address]  CBC with Differential     Status: Abnormal   Collection Time: 02/22/18 12:43 PM  Result Value Ref Range   WBC 0.4 (LL) 3.8 - 10.6 K/uL    Comment: RESULT REPEATED AND  VERIFIED CANCER CENTER CRITICAL VALUE PROTOCOL    RBC 2.94 (L) 4.40 - 5.90 MIL/uL   Hemoglobin 9.6 (L) 13.0 - 18.0 g/dL   HCT 27.2 (L) 40.0 - 52.0 %   MCV 92.3 80.0 - 100.0 fL   MCH 32.6 26.0 - 34.0 pg   MCHC 35.3 32.0 - 36.0 g/dL   RDW 16.0 (H) 11.5 - 14.5 %   Platelets 101 (L) 150 - 440 K/uL   Neutrophils Relative % 53 %   Neutro Abs 0.2 (L) 1.4 - 6.5 K/uL    Comment: RESULT REPEATED AND VERIFIED CRITICAL RESULT CALLED TO, READ BACK BY AND VERIFIED WITH: HEATHER JONES @ 2:46PM 02/22/2018 LGR    Lymphocytes Relative 13 %   Lymphs Abs 0.0 (L) 1.0 - 3.6 K/uL   Monocytes Relative 16 %   Monocytes Absolute 0.1 (L) 0.2 - 1.0 K/uL   Eosinophils Relative 13 %   Eosinophils Absolute 0.0 0 - 0.7 K/uL   Basophils Relative 5 %   Basophils Absolute 0.0 0 - 0.1 K/uL   WBC Morphology TOO FEW TO COUNT, SMEAR AVAILABLE FOR REVIEW     Comment: Performed at [facility], [address] [place], [place] [address]  Comprehensive metabolic panel     Status: Abnormal   Collection Time: 03/08/18  8:20 AM  Result Value Ref Range   Sodium 136 135 - 145 mmol/L   Potassium 3.4 (L) 3.5 - 5.1 mmol/L   Chloride 105 101 - 111 mmol/L   CO2 19 (L) 22 - 32 mmol/L   Glucose, Bld 267 (H) 65 - 99 mg/dL   BUN 27 (H) 6 - 20 mg/dL   Creatinine, Ser 1.49 (H) 0.61 - 1.24 mg/dL   Calcium 8.4 (L) 8.9 - 10.3 mg/dL   Total Protein 6.0 (L) 6.5 - 8.1 g/dL   Albumin 2.9 (L) 3.5 - 5.0 g/dL   AST 28 15 - 41 U/L   ALT 13 (L) 17 - 63 U/L   Alkaline Phosphatase 85 38 - 126 U/L   Total Bilirubin 0.7 0.3 - 1.2 mg/dL   GFR calc non Af Amer 46 (L) >60 mL/min   GFR calc Af Amer 53 (L) >60 mL/min    Comment: (NOTE) The eGFR has been calculated using the CKD EPI equation. This calculation has not been validated in all clinical situations. eGFR's persistently <60 mL/min signify possible Chronic Kidney Disease.    Anion gap 12 5 - 15    Comment: Performed at [facility], [address] [place], [place]  [address]  CBC with Differential     Status: Abnormal   Collection Time: 03/08/18  8:20 AM  Result Value Ref Range   WBC 7.3 3.8 - 10.6 K/uL   RBC 3.23 (L) 4.40 - 5.90 MIL/uL   Hemoglobin 10.6 (L) 13.0 - 18.0 g/dL   HCT 29.3 (L) 40.0 - 52.0 %   MCV 90.9 80.0 - 100.0 fL   MCH 32.7 26.0 - 34.0 pg   MCHC 36.0 32.0 - 36.0 g/dL   RDW 15.7 (H) 11.5 -  14.5 %   Platelets 147 (L) 150 - 440 K/uL   Neutrophils Relative % 60 %   Neutro Abs 4.5 1.4 - 6.5 K/uL   Lymphocytes Relative 24 %   Lymphs Abs 1.7 1.0 - 3.6 K/uL   Monocytes Relative 9 %   Monocytes Absolute 0.6 0.2 - 1.0 K/uL   Eosinophils Relative 6 %   Eosinophils Absolute 0.4 0 - 0.7 K/uL   Basophils Relative 1 %   Basophils Absolute 0.1 0 - 0.1 K/uL    Comment: Performed at [facility], [address] [place], [place] [address]  Comprehensive metabolic panel     Status: Abnormal   Collection Time: 04/05/18  7:54 AM  Result Value Ref Range   Sodium 140 135 - 145 mmol/L   Potassium 3.9 3.5 - 5.1 mmol/L   Chloride 114 (H) 98 - 111 mmol/L   CO2 21 (L) 22 - 32 mmol/L   Glucose, Bld 120 (H) 70 - 99 mg/dL   BUN 21 8 - 23 mg/dL   Creatinine, Ser 1.32 (H) 0.61 - 1.24 mg/dL   Calcium 8.0 (L) 8.9 - 10.3 mg/dL   Total Protein 5.5 (L) 6.5 - 8.1 g/dL   Albumin 2.9 (L) 3.5 - 5.0 g/dL   AST 26 15 - 41 U/L   ALT 11 0 - 44 U/L   Alkaline Phosphatase 99 38 - 126 U/L   Total Bilirubin 0.3 0.3 - 1.2 mg/dL   GFR calc non Af Amer 53 (L) >60 mL/min   GFR calc Af Amer >60 >60 mL/min    Comment: (NOTE) The eGFR has been calculated using the CKD EPI equation. This calculation has not been validated in all clinical situations. eGFR's persistently <60 mL/min signify possible Chronic Kidney Disease.    Anion gap 5 5 - 15    Comment: Performed at [facility], [address] [place], [place] [address]  CBC with Differential     Status: Abnormal   Collection Time: 04/05/18  7:54 AM  Result Value Ref Range   WBC 3.6 (L) 3.8 - 10.6 K/uL    RBC 2.90 (L) 4.40 - 5.90 MIL/uL   Hemoglobin 9.7 (L) 13.0 - 18.0 g/dL   HCT 27.4 (L) 40.0 - 52.0 %   MCV 94.2 80.0 - 100.0 fL   MCH 33.5 26.0 - 34.0 pg   MCHC 35.6 32.0 - 36.0 g/dL   RDW 16.8 (H) 11.5 - 14.5 %   Platelets 61 (L) 150 - 440 K/uL   Neutrophils Relative % 32 %   Neutro Abs 1.1 (L) 1.4 - 6.5 K/uL   Lymphocytes Relative 46 %   Lymphs Abs 1.7 1.0 - 3.6 K/uL   Monocytes Relative 14 %   Monocytes Absolute 0.5 0.2 - 1.0 K/uL   Eosinophils Relative 6 %   Eosinophils Absolute 0.2 0 - 0.7 K/uL   Basophils Relative 2 %   Basophils Absolute 0.1 0 - 0.1 K/uL    Comment: Performed at [facility], [address], [place], [place] [address]    Radiology No results found.  Assessment/Plan Essential hypertension, benign blood pressure control important in reducing the progression of atherosclerotic disease. On appropriate oral medications.   Chronic systolic CHF (congestive heart failure) (HCC) Likely exacerbating lower extremity swelling. Should discuss with primary care physician   Diabetes ([place]) blood glucose control important in reducing the progression of atherosclerotic disease. Also, involved in wound healing. On appropriate medications.   Hypercholesterolemia lipid control important  in reducing the progression of atherosclerotic disease. Continue statin therapy   Lymphedema We are going to go ahead and wrap both legs in an Unna boot today since he does not have compression stockings.  I think the left leg can be transitioned over to a compression stocking once he gets those.  The right leg likely need several more weeks in an Unna boot to try to get the skin healed.  I will check him back in about a month.    Leotis Pain, MD  05/06/2018 12:13 PM    This note was created with Dragon medical transcription system.  Any errors from dictation are purely unintentional

## 2018-05-06 NOTE — Assessment & Plan Note (Signed)
We are going to go ahead and wrap both legs in an [employer] today since he does not have compression stockings.  I think the left leg can be transitioned over to a compression stocking once he gets those.  The right leg likely need several more weeks in an Unna boot to try to get the skin healed.  I will check him back in about a month.

## 2018-05-11 ENCOUNTER — Ambulatory Visit (INDEPENDENT_AMBULATORY_CARE_PROVIDER_SITE_OTHER): Payer: Medicare Other | Admitting: Internal Medicine

## 2018-05-11 ENCOUNTER — Encounter: Payer: Self-pay | Admitting: Internal Medicine

## 2018-05-11 DIAGNOSIS — C8318 Mantle cell lymphoma, lymph nodes of multiple sites: Secondary | ICD-10-CM

## 2018-05-11 DIAGNOSIS — I429 Cardiomyopathy, unspecified: Secondary | ICD-10-CM

## 2018-05-11 DIAGNOSIS — E7800 Pure hypercholesterolemia, unspecified: Secondary | ICD-10-CM | POA: Diagnosis not present

## 2018-05-11 DIAGNOSIS — D649 Anemia, unspecified: Secondary | ICD-10-CM | POA: Diagnosis not present

## 2018-05-11 DIAGNOSIS — R600 Localized edema: Secondary | ICD-10-CM

## 2018-05-11 DIAGNOSIS — E162 Hypoglycemia, unspecified: Secondary | ICD-10-CM

## 2018-05-11 DIAGNOSIS — E1151 Type 2 diabetes mellitus with diabetic peripheral angiopathy without gangrene: Secondary | ICD-10-CM | POA: Diagnosis not present

## 2018-05-11 DIAGNOSIS — I5022 Chronic systolic (congestive) heart failure: Secondary | ICD-10-CM | POA: Diagnosis not present

## 2018-05-11 DIAGNOSIS — E079 Disorder of thyroid, unspecified: Secondary | ICD-10-CM | POA: Diagnosis not present

## 2018-05-11 DIAGNOSIS — I10 Essential (primary) hypertension: Secondary | ICD-10-CM

## 2018-05-11 DIAGNOSIS — I890 Lymphedema, not elsewhere classified: Secondary | ICD-10-CM

## 2018-05-11 DIAGNOSIS — I428 Other cardiomyopathies: Secondary | ICD-10-CM | POA: Diagnosis not present

## 2018-05-11 DIAGNOSIS — I2510 Atherosclerotic heart disease of native coronary artery without angina pectoris: Secondary | ICD-10-CM

## 2018-05-11 NOTE — Progress Notes (Signed)
Patient ID: [name], male   DOB: Feb 18, 1946, 72 y.o.   MRN: [id number]   Subjective:    [name], male    DOB: 07-03-1946, 72 y.o.   MRN: [id number]  HPI  Patient here for a scheduled follow up. Seeing Dr Gary [name] regularly for f/u mantle cell lymphoma.  Recent CT scan revealed significant improvement in patient;s lymphadenopathy and disease burden.  Recommended f/u in 3 months.  Platelet count low.  Following hgb and iron levels.  No chest pain.  Breathing stable.  Eating.  No nausea or vomiting.  No abdominal pain.  Bowels moving.  Having problems with low sugars.  Intermittent drops.  It appears he is overshooting his insulin boluses.  Discussed with him today.  Discussed that he is not to override the insulin boluses.  Had to call EMS recently for low sugar.  Passed out/fell.  Laceration on arm.  Did not hit head.  No headache.  No dizziness.  Overall he feels better.     Past Medical History:  Diagnosis Date  . Depression   . 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 Gastroparesis   . Graves disease   . Hypercholesterolemia   . Hypertension   . Mantle cell lymphoma ([place])   . Peripheral neuropathy    Past Surgical History:  Procedure Laterality Date  . CHOLECYSTECTOMY    . NEPHRECTOMY     right  . PACEMAKER INSERTION N/A 12/07/2017   Procedure: INSERTION PACEMAKER DUAL CHAMBER INITIAL INSERT;  Surgeon: Gary Cowman, MD;  Location: [facility];  Service: Cardiovascular;  Laterality: N/A;  . PORTA CATH INSERTION N/A 11/03/2017   Procedure: PORTA CATH INSERTION;  Surgeon: Gary Cabal, MD;  Location: [facility];  Service: Cardiovascular;  Laterality: N/A;   Family History  Problem Relation Age of Onset  . Breast cancer Mother   . Diabetes Father   . Cancer Father        Lymphoma  . Alcohol abuse Maternal Uncle   . Diabetes Sister   . Heart disease Unknown        grandfather   Social  History   Socioeconomic History  . Marital status: Married    Spouse name: Not on file  . Number of children: 1  . Years of education: Not on file  . Highest education level: Not on file  Occupational History  . Not on file  Social Needs  . Financial resource strain: Not on file  . Food insecurity:    Worry: Not on file    Inability: Not on file  . Transportation needs:    Medical: Not on file    Non-medical: Not on file  Tobacco Use  . Smoking status: Former Research scientist (life sciences)  . Smokeless tobacco: Current User    Types: Chew  Substance and Sexual Activity  . Alcohol use: Yes    Alcohol/week: 1.8 oz    Types: 3 Cans of beer per week    Comment: nightly  . Drug use: No  . Sexual activity: Not Currently  Lifestyle  . Physical activity:    Days per week: Not on file    Minutes per session: Not on file  . Stress: Not on file  Relationships  . Social connections:    Talks on phone: Not on file    Gets together: Not on file    Attends religious service: Not on file    Active  member of club or organization: Not on file    Attends meetings of clubs or organizations: Not on file    Relationship status: Not on file  Other Topics Concern  . Not on file  Social History Narrative  . Not on file    Outpatient Encounter Medications as of 05/11/2018  Medication Sig  . ACCU-CHEK AVIVA PLUS test strip USE AS INSTRUCTED. USE 1 STRIP EVERY 3 HOURS . ACCU-CHECK AVIVA PLUS TEST STRIP. DX P50.932  . acyclovir (ZOVIRAX) 400 MG tablet TAKE 1 TABLET BY MOUTH EVERY DAY  . carvedilol (COREG) 3.125 MG tablet Take 3.125 mg by mouth 2 (two) times daily with a meal.   . ferrous sulfate 325 (65 FE) MG tablet Take 325 mg by mouth daily.  . furosemide (LASIX) 20 MG tablet Take 1 tablet (20 mg total) by mouth daily.  Marland Kitchen GLUCAGON EMERGENCY 1 MG injection   . glucose 4 GM chewable tablet Chew 1 tablet by mouth once as needed for low blood sugar.  Marland Kitchen glucose blood (PRECISION QID TEST) test strip Use 8 (eight)  times daily as directed. BAYER CONTOUR NEXT TEST STRIPS E10.649  . insulin aspart (NOVOLOG) 100 UNIT/ML injection Basal rate 12am 0.8 units per hour, 9am 0.9 units per hour. (total basal insulin 20.7 units). Carbohydrate ratio 1 units for every 8gm of carbohydrate. Correction factor 1 units for every 50mg /dl over target cbg. Target CBG 80-120  . levothyroxine (SYNTHROID, LEVOTHROID) 25 MCG tablet Take 25 mcg by mouth daily.  Marland Kitchen lidocaine-prilocaine (EMLA) cream Apply 1 application topically as needed. Apply to port prior to chemotherapy appointment, cover with plastic wrap.  . lisinopril (PRINIVIL,ZESTRIL) 20 MG tablet Take 20 mg by mouth daily.  . Multiple Vitamin (MULTIVITAMIN) tablet Take 1 tablet by mouth daily.  . naproxen sodium (ALEVE) 220 MG tablet Take 220 mg by mouth daily as needed.  . pravastatin (PRAVACHOL) 40 MG tablet Take 1 tablet (40 mg total) by mouth at bedtime. Will need office visit for any more refills  . spironolactone (ALDACTONE) 25 MG tablet Take 25 mg by mouth daily.   Facility-Administered Encounter Medications as of 05/11/2018  Medication  . heparin lock flush 100 unit/mL  . sodium chloride flush (NS) 0.9 % injection 10 mL  . sodium chloride flush (NS) 0.9 % injection 10 mL    Review of Systems  Constitutional: Negative for appetite change and unexpected weight change.  HENT: Negative for congestion and sinus pressure.   Respiratory: Negative for cough, chest tightness and shortness of breath.   Cardiovascular: Negative for chest pain, palpitations and leg swelling.  Gastrointestinal: Negative for abdominal pain, diarrhea, nausea and vomiting.  Genitourinary: Negative for difficulty urinating and dysuria.  Musculoskeletal: Negative for joint swelling and myalgias.  Skin: Negative for color change and rash.       Laceration - arm.  No increased erythema.  Bandage in place.    Neurological: Negative for dizziness, light-headedness and headaches.    Psychiatric/Behavioral: Negative for agitation and dysphoric mood.       Objective:    Physical Exam  Constitutional: He appears well-developed and well-nourished. No distress.  HENT:  Nose: Nose normal.  Mouth/Throat: Oropharynx is clear and moist.  Neck: Neck supple.  Cardiovascular: Normal rate and regular rhythm.  Pulmonary/Chest: Effort normal and breath sounds normal. No respiratory distress.  Abdominal: Soft. Bowel sounds are normal. There is no tenderness.  Musculoskeletal: He exhibits no edema or tenderness.  Lymphadenopathy:    He has no cervical  adenopathy.  Skin: No rash noted. No erythema.  Initially - bandage over laceration.  Stuck to skin.  Cleaned with sterile saline.  Bandage removed.  Bandage reapplied.  No surrounding erythema.    Psychiatric: He has a normal mood and affect. His behavior is normal.    BP 120/70 (BP Location: Left Arm, Patient Position: Sitting, Cuff Size: Normal)   Pulse 86   Temp 97.9 F (36.6 C) (Oral)   Resp 16   Wt 238 lb 9.6 oz (108.2 kg)   SpO2 98%   BMI 34.24 kg/m  Wt Readings from Last 3 Encounters:  05/13/18 242 lb (109.8 kg)  05/11/18 238 lb 9.6 oz (108.2 kg)  05/06/18 238 lb (108 kg)     Lab Results  Component Value Date   WBC 3.6 (L) 04/05/2018   HGB 9.7 (L) 04/05/2018   HCT 27.4 (L) 04/05/2018   PLT 61 (L) 04/05/2018   GLUCOSE 120 (H) 04/05/2018   CHOL 197 08/09/2015   TRIG 51.0 08/09/2015   HDL 73.60 08/09/2015   LDLCALC 113 (H) 08/09/2015   ALT 11 04/05/2018   AST 26 04/05/2018   NA 140 04/05/2018   K 3.9 04/05/2018   CL 114 (H) 04/05/2018   CREATININE 1.32 (H) 04/05/2018   BUN 21 04/05/2018   CO2 21 (L) 04/05/2018   TSH 3.894 10/28/2017   INR 1.22 12/02/2017   HGBA1C 5.6 10/28/2017   MICROALBUR 1.9 09/22/2016    Ct Chest W Contrast  Result Date: 04/01/2018 CLINICAL DATA:  Followup lymphoma. EXAM: CT CHEST, ABDOMEN, AND PELVIS WITH CONTRAST TECHNIQUE: Multidetector CT imaging of the chest, abdomen  and pelvis was performed following the standard protocol during bolus administration of intravenous contrast. CONTRAST:  82mL ISOVUE-300 IOPAMIDOL (ISOVUE-300) INJECTION 61% COMPARISON:  01/03/2018. FINDINGS: CT CHEST FINDINGS Cardiovascular: Normal heart size. Aortic atherosclerosis. Calcification within the RCA, and LAD coronary artery noted. Mediastinum/Nodes: Normal appearance of the thyroid gland. The trachea appears patent and is midline. The index right retropectoral node measures 4 mm, image 20/2. Previously 7 mm. The previous index right paratracheal node measures 6 mm, image 26/2. Previously 9 mm. Previous index right hilar node measures 7 mm, image 30/2. Previously 11 mm. Lungs/Pleura: No pleural effusion. Mild changes of emphysema. Unchanged appearance of 5 mm left upper lobe lung nodule, image 47/4. Right upper lobe lung nodule is stable measuring 5 mm, image 55/4. The right lower lobe lung nodule described on previous exam has resolved. Musculoskeletal: Chronic appearing right proximal humeral neck fracture. Chronic left posterior rib fracture deformities noted. CT ABDOMEN PELVIS FINDINGS Hepatobiliary: No focal liver abnormality identified. Previous cholecystectomy. No biliary dilatation. Pancreas: Unremarkable. No pancreatic ductal dilatation or surrounding inflammatory changes. Spleen: Normal in size without focal abnormality. Adrenals/Urinary Tract: The adrenal glands are normal. The right kidney is absent. Unremarkable appearance of the left kidney. Stable cyst arising from the inferior pole of left kidney measuring 1 cm. Urinary bladder is unremarkable. Stomach/Bowel: Stomach is within normal limits. No evidence of bowel wall thickening, distention, or inflammatory changes. Vascular/Lymphatic: Aortic atherosclerosis without aneurysm. Index left retroperitoneal periaortic node measures 9 mm, image 85/2. Previously 1.2 cm. The index left common iliac lymph node measures 1.6 cm, image 92/2.  Previously 2.4 cm. The index left common iliac lymph node measures 1.4 cm, image 107/2. Previously 2.3 cm. The index right inguinal node measures 1.3 cm, image 132/2. Previously 1.8 cm. Reproductive: Prostate is unremarkable. Other: There is no ascites. No free fluid or fluid collections identified. Musculoskeletal:  No suspicious bone lesions identified. IMPRESSION: 1. Continued interval decrease in size of lymph nodes within the chest, abdomen and pelvis as detailed above. 2. Stable small pulmonary nodules. 3.  Aortic Atherosclerosis (ICD10-I70.0). 4. RCA and LAD coronary artery atherosclerotic calcifications. Electronically Signed   By: Kerby Moors M.D.   On: 04/01/2018 12:56   Ct Abdomen Pelvis W Contrast  Result Date: 04/01/2018 CLINICAL DATA:  Followup lymphoma. EXAM: CT CHEST, ABDOMEN, AND PELVIS WITH CONTRAST TECHNIQUE: Multidetector CT imaging of the chest, abdomen and pelvis was performed following the standard protocol during bolus administration of intravenous contrast. CONTRAST:  40mL ISOVUE-300 IOPAMIDOL (ISOVUE-300) INJECTION 61% COMPARISON:  01/03/2018. FINDINGS: CT CHEST FINDINGS Cardiovascular: Normal heart size. Aortic atherosclerosis. Calcification within the RCA, and LAD coronary artery noted. Mediastinum/Nodes: Normal appearance of the thyroid gland. The trachea appears patent and is midline. The index right retropectoral node measures 4 mm, image 20/2. Previously 7 mm. The previous index right paratracheal node measures 6 mm, image 26/2. Previously 9 mm. Previous index right hilar node measures 7 mm, image 30/2. Previously 11 mm. Lungs/Pleura: No pleural effusion. Mild changes of emphysema. Unchanged appearance of 5 mm left upper lobe lung nodule, image 47/4. Right upper lobe lung nodule is stable measuring 5 mm, image 55/4. The right lower lobe lung nodule described on previous exam has resolved. Musculoskeletal: Chronic appearing right proximal humeral neck fracture. Chronic left  posterior rib fracture deformities noted. CT ABDOMEN PELVIS FINDINGS Hepatobiliary: No focal liver abnormality identified. Previous cholecystectomy. No biliary dilatation. Pancreas: Unremarkable. No pancreatic ductal dilatation or surrounding inflammatory changes. Spleen: Normal in size without focal abnormality. Adrenals/Urinary Tract: The adrenal glands are normal. The right kidney is absent. Unremarkable appearance of the left kidney. Stable cyst arising from the inferior pole of left kidney measuring 1 cm. Urinary bladder is unremarkable. Stomach/Bowel: Stomach is within normal limits. No evidence of bowel wall thickening, distention, or inflammatory changes. Vascular/Lymphatic: Aortic atherosclerosis without aneurysm. Index left retroperitoneal periaortic node measures 9 mm, image 85/2. Previously 1.2 cm. The index left common iliac lymph node measures 1.6 cm, image 92/2. Previously 2.4 cm. The index left common iliac lymph node measures 1.4 cm, image 107/2. Previously 2.3 cm. The index right inguinal node measures 1.3 cm, image 132/2. Previously 1.8 cm. Reproductive: Prostate is unremarkable. Other: There is no ascites. No free fluid or fluid collections identified. Musculoskeletal: No suspicious bone lesions identified. IMPRESSION: 1. Continued interval decrease in size of lymph nodes within the chest, abdomen and pelvis as detailed above. 2. Stable small pulmonary nodules. 3.  Aortic Atherosclerosis (ICD10-I70.0). 4. RCA and LAD coronary artery atherosclerotic calcifications. Electronically Signed   By: Kerby Moors M.D.   On: 04/01/2018 12:56       Assessment & Plan:   Problem List Items Addressed This Visit    Anemia    Being followed by oncology.       Bilateral leg edema    Seeing vascular surgery.  Legs wrapped.  Sees them regularly.  Improved.       CAD in native artery    Continue risk factor modification.  Sees cardiology.       Cardiomyopathy, idiopathic ([place])    Followed by  cardiology.        Chronic systolic CHF (congestive heart failure) ([facility])    Lungs clear.  Breathing stable.  Followed by cardiology.        Diabetes ([place])    Followed by endocrinology.  Seeing Dr Honor Junes now.  Aware  of hypoglycemia.  Discussed at length with him today.  Discussed not overriding boluses and eating regular meals.  Blood sugar 366 this am.  Recheck here 70.  Given juice and glucose tablets.  Prior to leaving - 136.  Going to eat.  Continue f/u with endocrinology.        Essential hypertension, benign    Blood pressure under good control.  Continue same medication regimen.  Follow pressures.  Follow metabolic panel.        Hypercholesterolemia    On pravastatin.  Low cholesterol diet and exercise.  Follow lipid panel and liver function tests.        Hypoglycemia    As above, sugar here in office 70s.  Given juice and glucose tablet.  Prior to leaving 130s.  Discussed with him at length.  Discussed not overriding boluses.  Dr Honor Junes aware.  Keep f/u with endocrinology.  Discussed importance of eating regular meals.        Lymphedema    Followed by [facility].        Mantle cell lymphoma ([place])    Seeing oncology.  Recent CT scan - improved.  Holding treatment per note.        Thyroid disease    Has been followed by endocrinology.           I spent 40 minutes with the patient and more than 50% of the time was spent in consultation regarding the above.  Time spent discussing his current concerns and symptoms.  Time also spent discussing treatment and further evaluation.     [name], MD

## 2018-05-13 ENCOUNTER — Ambulatory Visit (INDEPENDENT_AMBULATORY_CARE_PROVIDER_SITE_OTHER): Payer: Medicare Other | Admitting: Nurse Practitioner

## 2018-05-13 ENCOUNTER — Encounter (INDEPENDENT_AMBULATORY_CARE_PROVIDER_SITE_OTHER): Payer: Self-pay

## 2018-05-13 VITALS — BP 130/67 | HR 87 | Resp 16 | Ht 70.0 in | Wt 242.0 lb

## 2018-05-13 DIAGNOSIS — I890 Lymphedema, not elsewhere classified: Secondary | ICD-10-CM | POA: Diagnosis not present

## 2018-05-13 NOTE — Progress Notes (Signed)
History of Present Illness  There is no documented history at this time  Assessments & Plan   There are no diagnoses linked to this encounter.    Additional instructions  Subjective:  Patient presents with venous ulcer of the Bilateral lower extremity.    Procedure:  3 layer unna wrap was placed Bilateral lower extremity.   Plan:   Follow up in one week.  

## 2018-05-15 ENCOUNTER — Encounter: Payer: Self-pay | Admitting: Internal Medicine

## 2018-05-15 NOTE — Assessment & Plan Note (Signed)
Continue risk factor modification.  Sees cardiology.

## 2018-05-15 NOTE — Assessment & Plan Note (Signed)
On pravastatin.  Low cholesterol diet and exercise.  Follow lipid panel and liver function tests.   

## 2018-05-15 NOTE — Assessment & Plan Note (Signed)
Followed by [facility].   

## 2018-05-15 NOTE — Assessment & Plan Note (Signed)
Followed by endocrinology.  Seeing Dr Honor Junes now.  Aware of hypoglycemia.  Discussed at length with him today.  Discussed not overriding boluses and eating regular meals.  Blood sugar 366 this am.  Recheck here 70.  Given juice and glucose tablets.  Prior to leaving - 136.  Going to eat.  Continue f/u with endocrinology.

## 2018-05-15 NOTE — Assessment & Plan Note (Signed)
Lungs clear.  Breathing stable.  Followed by cardiology.

## 2018-05-15 NOTE — Assessment & Plan Note (Signed)
As above, sugar here in office 70s.  Given juice and glucose tablet.  Prior to leaving 130s.  Discussed with him at length.  Discussed not overriding boluses.  Dr Honor Junes aware.  Keep f/u with endocrinology.  Discussed importance of eating regular meals.

## 2018-05-15 NOTE — Assessment & Plan Note (Signed)
Followed by cardiology 

## 2018-05-15 NOTE — Assessment & Plan Note (Signed)
Being followed by oncology. ?

## 2018-05-15 NOTE — Assessment & Plan Note (Signed)
Seeing oncology.  Recent CT scan - improved.  Holding treatment per note.

## 2018-05-15 NOTE — Assessment & Plan Note (Signed)
Seeing vascular surgery.  Legs wrapped.  Sees them regularly.  Improved.

## 2018-05-15 NOTE — Assessment & Plan Note (Signed)
Has been followed by endocrinology.

## 2018-05-15 NOTE — Assessment & Plan Note (Signed)
Blood pressure under good control.  Continue same medication regimen.  Follow pressures.  Follow metabolic panel.   

## 2018-05-17 ENCOUNTER — Inpatient Hospital Stay: Payer: Medicare Other | Attending: Oncology

## 2018-05-17 DIAGNOSIS — Z452 Encounter for adjustment and management of vascular access device: Secondary | ICD-10-CM | POA: Insufficient documentation

## 2018-05-17 DIAGNOSIS — C8318 Mantle cell lymphoma, lymph nodes of multiple sites: Secondary | ICD-10-CM | POA: Diagnosis not present

## 2018-05-17 DIAGNOSIS — Z95828 Presence of other vascular implants and grafts: Secondary | ICD-10-CM

## 2018-05-17 MED ORDER — SODIUM CHLORIDE 0.9% FLUSH
10.0000 mL | Freq: Once | INTRAVENOUS | Status: AC
  Administered 2018-05-17: 10 mL via INTRAVENOUS
  Filled 2018-05-17: qty 10

## 2018-05-17 MED ORDER — HEPARIN SOD (PORK) LOCK FLUSH 100 UNIT/ML IV SOLN
500.0000 [IU] | Freq: Once | INTRAVENOUS | Status: AC
  Administered 2018-05-17: 500 [IU] via INTRAVENOUS

## 2018-05-20 ENCOUNTER — Ambulatory Visit (INDEPENDENT_AMBULATORY_CARE_PROVIDER_SITE_OTHER): Payer: Medicare Other | Admitting: Nurse Practitioner

## 2018-05-20 ENCOUNTER — Encounter (INDEPENDENT_AMBULATORY_CARE_PROVIDER_SITE_OTHER): Payer: Self-pay | Admitting: Nurse Practitioner

## 2018-05-20 VITALS — BP 129/64 | HR 60 | Resp 16 | Ht 70.0 in | Wt 240.0 lb

## 2018-05-20 DIAGNOSIS — I890 Lymphedema, not elsewhere classified: Secondary | ICD-10-CM

## 2018-05-20 NOTE — Progress Notes (Signed)
History of Present Illness  There is no documented history at this time  Assessments & Plan   There are no diagnoses linked to this encounter.    Additional instructions  Subjective:  Patient presents with venous ulcer of the Bilateral lower extremity.    Procedure:  3 layer unna wrap was placed Bilateral lower extremity.   Plan:   Follow up in one week.  

## 2018-05-27 ENCOUNTER — Encounter (INDEPENDENT_AMBULATORY_CARE_PROVIDER_SITE_OTHER): Payer: Self-pay | Admitting: Nurse Practitioner

## 2018-05-27 ENCOUNTER — Ambulatory Visit (INDEPENDENT_AMBULATORY_CARE_PROVIDER_SITE_OTHER): Payer: Medicare Other | Admitting: Nurse Practitioner

## 2018-05-27 VITALS — BP 110/60 | HR 83 | Resp 16 | Ht 70.0 in | Wt 238.0 lb

## 2018-05-27 DIAGNOSIS — I890 Lymphedema, not elsewhere classified: Secondary | ICD-10-CM

## 2018-05-27 NOTE — Progress Notes (Signed)
History of Present Illness  There is no documented history at this time  Assessments & Plan   There are no diagnoses linked to this encounter.    Additional instructions  Subjective:  Patient presents with venous ulcer of the Bilateral lower extremity.    Procedure:  3 layer unna wrap was placed Bilateral lower extremity.   Plan:   Follow up in one week.  

## 2018-06-03 ENCOUNTER — Encounter (INDEPENDENT_AMBULATORY_CARE_PROVIDER_SITE_OTHER): Payer: Self-pay

## 2018-06-03 ENCOUNTER — Ambulatory Visit (INDEPENDENT_AMBULATORY_CARE_PROVIDER_SITE_OTHER): Payer: Medicare Other | Admitting: Nurse Practitioner

## 2018-06-03 VITALS — BP 112/63 | HR 72 | Resp 17 | Ht 70.0 in | Wt 237.0 lb

## 2018-06-03 DIAGNOSIS — I890 Lymphedema, not elsewhere classified: Secondary | ICD-10-CM

## 2018-06-03 NOTE — Progress Notes (Signed)
History of Present Illness  There is no documented history at this time  Assessments & Plan   There are no diagnoses linked to this encounter.    Additional instructions  Subjective:  Patient presents with venous ulcer of the Bilateral lower extremity.    Procedure:  3 layer unna wrap was placed Bilateral lower extremity.   Plan:   Follow up in one week.  

## 2018-06-07 ENCOUNTER — Ambulatory Visit (INDEPENDENT_AMBULATORY_CARE_PROVIDER_SITE_OTHER): Payer: Medicare Other | Admitting: Vascular Surgery

## 2018-06-07 ENCOUNTER — Encounter (INDEPENDENT_AMBULATORY_CARE_PROVIDER_SITE_OTHER): Payer: Self-pay | Admitting: Vascular Surgery

## 2018-06-07 VITALS — BP 112/59 | HR 76 | Resp 16 | Ht 70.0 in | Wt 238.0 lb

## 2018-06-07 DIAGNOSIS — I5022 Chronic systolic (congestive) heart failure: Secondary | ICD-10-CM

## 2018-06-07 DIAGNOSIS — E7800 Pure hypercholesterolemia, unspecified: Secondary | ICD-10-CM | POA: Diagnosis not present

## 2018-06-07 DIAGNOSIS — I2510 Atherosclerotic heart disease of native coronary artery without angina pectoris: Secondary | ICD-10-CM | POA: Diagnosis not present

## 2018-06-07 DIAGNOSIS — I890 Lymphedema, not elsewhere classified: Secondary | ICD-10-CM | POA: Diagnosis not present

## 2018-06-07 DIAGNOSIS — E1151 Type 2 diabetes mellitus with diabetic peripheral angiopathy without gangrene: Secondary | ICD-10-CM | POA: Diagnosis not present

## 2018-06-07 DIAGNOSIS — I10 Essential (primary) hypertension: Secondary | ICD-10-CM

## 2018-06-07 NOTE — Assessment & Plan Note (Signed)
Persistent ulceration with swelling on the right leg.  Swelling on the left leg not particularly well controlled although there is not ulceration there either.  Unna boot's were placed today on both lower extremities, 3 layer wraps.  These will be changed weekly.  Reassess in 1 month

## 2018-06-07 NOTE — Progress Notes (Signed)
MRN : [id number]  Gary [name] is a 72 y.o. ([birth year]) male who presents with chief complaint of  Chief Complaint  Patient presents with  . Follow-up    Unna boot check  .  History of Present Illness: Patient returns in follow up for lower extremity swelling and ulceration.  He has had more drainage from his right leg ulcerations.  His swelling is suboptimally controlled bilaterally.  No fevers or chills..    Past Medical History:  Diagnosis Date  . Depression   . 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 Gastroparesis   . Graves disease   . Hypercholesterolemia   . Hypertension   . Mantle cell lymphoma ([place])   . Peripheral neuropathy     Past Surgical History:  Procedure Laterality Date  . CHOLECYSTECTOMY    . NEPHRECTOMY     right  . PACEMAKER INSERTION N/A 12/07/2017   Procedure: INSERTION PACEMAKER DUAL CHAMBER INITIAL INSERT;  Surgeon: Isaias Cowman, MD;  Location: [facility];  Service: Cardiovascular;  Laterality: N/A;  . PORTA CATH INSERTION N/A 11/03/2017   Procedure: PORTA CATH INSERTION;  Surgeon: Katha Cabal, MD;  Location: [facility];  Service: Cardiovascular;  Laterality: N/A;    Social History Social History   Tobacco Use  . Smoking status: Former Research scientist (life sciences)  . Smokeless tobacco: Current User    Types: Chew  Substance Use Topics  . Alcohol use: Yes    Alcohol/week: 3.0 standard drinks    Types: 3 Cans of beer per week    Comment: nightly  . Drug use: No     Family History Family History  Problem Relation Age of Onset  . Breast cancer Mother   . Diabetes Father   . Cancer Father        Lymphoma  . Alcohol abuse Maternal Uncle   . Diabetes Sister   . Heart disease Unknown        grandfather     Current Outpatient Medications  Medication Sig Dispense Refill  . ACCU-CHEK AVIVA PLUS test strip USE AS INSTRUCTED. USE 1 STRIP EVERY 3 HOURS . ACCU-CHECK AVIVA PLUS TEST  STRIP. DX E10.649  12  . acyclovir (ZOVIRAX) 400 MG tablet TAKE 1 TABLET BY MOUTH EVERY DAY 30 tablet 3  . carvedilol (COREG) 3.125 MG tablet Take 3.125 mg by mouth 2 (two) times daily with a meal.     . cyanocobalamin (,VITAMIN B-12,) 1000 MCG/ML injection Inject into the muscle.    . diphenhydrAMINE-zinc acetate (BENADRYL EXTRA STRENGTH) cream Apply 1 application topically daily as needed for itching (Place on legs before Unna boot placement).    . ferrous sulfate 325 (65 FE) MG tablet Take 325 mg by mouth daily.    . furosemide (LASIX) 20 MG tablet Take 1 tablet (20 mg total) by mouth daily. 30 tablet 1  . GLUCAGON EMERGENCY 1 MG injection     . glucose 4 GM chewable tablet Chew 1 tablet by mouth once as needed for low blood sugar.    Marland Kitchen glucose blood (PRECISION QID TEST) test strip Use 8 (eight) times daily as directed. BAYER CONTOUR NEXT TEST STRIPS E10.649    . insulin aspart (NOVOLOG) 100 UNIT/ML injection Basal rate 12am 0.8 units per hour, 9am 0.9 units per hour. (total basal insulin 20.7 units). Carbohydrate ratio 1 units for every 8gm of carbohydrate. Correction factor 1 units for every 46m/dl over target cbg. Target CBG  80-120 1 vial 12  . levothyroxine (SYNTHROID, LEVOTHROID) 25 MCG tablet Take 25 mcg by mouth daily.    Marland Kitchen lidocaine-prilocaine (EMLA) cream Apply 1 application topically as needed. Apply to port prior to chemotherapy appointment, cover with plastic wrap. 30 g 2  . lisinopril (PRINIVIL,ZESTRIL) 20 MG tablet Take 20 mg by mouth daily.    . Multiple Vitamin (MULTIVITAMIN) tablet Take 1 tablet by mouth daily.    . naproxen sodium (ALEVE) 220 MG tablet Take 220 mg by mouth daily as needed.    . pravastatin (PRAVACHOL) 40 MG tablet Take 1 tablet (40 mg total) by mouth at bedtime. Will need office visit for any more refills 30 tablet 0  . prochlorperazine (COMPAZINE) 10 MG tablet TAKE 1 TABLET (10 MG TOTAL) BY MOUTH EVERY 6 (SIX) HOURS AS NEEDED (NAUSEA OR VOMITING).    Marland Kitchen  spironolactone (ALDACTONE) 25 MG tablet Take 25 mg by mouth daily.     No current facility-administered medications for this visit.    Facility-Administered Medications Ordered in Other Visits  Medication Dose Route Frequency Provider Last Rate Last Dose  . heparin lock flush 100 unit/mL  500 Units Intravenous Once Lloyd Huger, MD      . sodium chloride flush (NS) 0.9 % injection 10 mL  10 mL Intravenous PRN Lloyd Huger, MD   10 mL at 02/01/18 0829  . sodium chloride flush (NS) 0.9 % injection 10 mL  10 mL Intravenous PRN Lloyd Huger, MD   10 mL at 04/05/18 0800    Allergies  Allergen Reactions  . Rofecoxib Nausea Only    REVIEW OF SYSTEMS(Negative unless checked)  Constitutional: _0 Weight loss_1 Fever_2 Chills Cardiac:_3 Chest pain_4 Chest pressure_5 Palpitations _6 Shortness of breath when laying flat _7 Shortness of breath at rest _8 Shortness of breath with exertion. Vascular: _9 Pain in legs with walking_10 Pain in legsat rest_11 Pain in legs when laying flat _12 Claudication _13 Pain in feet when walking _14 Pain in feet at rest _15 Pain in feet when laying flat _16 History of DVT _17 Phlebitis _18 Swelling in legs _19 Varicose veins _20 Non-healing ulcers Pulmonary: _21 Uses home oxygen _22 Productive cough_23 Hemoptysis _24 Wheeze _25 COPD _26 Asthma Neurologic: _27 Dizziness _28 Blackouts _29 Seizures _30 History of stroke _31 History of TIA_32 Aphasia _33 Temporary blindness_34 Dysphagia _35 Weaknessor numbness in arms _36 Weakness or numbnessin legs Musculoskeletal: _37 Arthritis _38 Joint swelling _39 Joint pain _40 Low back pain Hematologic:_41 Easy bruising_42 Easy bleeding _43 Hypercoagulable state _44 Anemic  Gastrointestinal:_45 Blood in stool_46 Vomiting blood_47 Gastroesophageal reflux/heartburn_48 Abdominal pain Genitourinary: _49 Chronic kidney disease _50 Difficulturination _51 Frequenturination _52 Burning with  urination_53 Hematuria Skin: _54 Rashes _55 Ulcers _56 Wounds Psychological: _57 History of anxiety_58 History of major depression.   Physical Examination  Vitals:   06/07/18 0953  BP: (!) 112/59  Pulse: 76  Resp: 16  Weight: 238 lb (108 kg)  Height: _59  (1.778 m)   Body mass index is 34.15 kg/m. Gen:  WD/WN, NAD, appears older than stated age Head: [place]/AT, No temporalis wasting. Ear/Nose/Throat: Hearing grossly intact, dentition poor Eyes: Conjunctiva clear. Sclera non-icteric Neck: Supple. Trachea midline Pulmonary:  Good air movement, respirations not labored, no use of accessory muscles.  Cardiac: RRR, no JVD Vascular:  Vessel Right Left  Radial Palpable Palpable                                   Musculoskeletal: M/S 5/5 throughout.  No deformity or atrophy.  2+ bilateral lower extremity edema. Neurologic: Sensation grossly intact in extremities.  Symmetrical.  Speech is fluent. Psychiatric: Judgment intact, Mood & affect appropriate for pt's clinical situation. Dermatologic: Superficial ulceration on the right lateral lower leg measuring about  4 to 5 cm in diameter.  Clean without surrounding erythema.  Serous drainage is present      Labs Recent Results (from the past 2160 hour(s))  Comprehensive metabolic panel     Status: Abnormal   Collection Time: 04/05/18  7:54 AM  Result Value Ref Range   Sodium 140 135 - 145 mmol/L   Potassium 3.9 3.5 - 5.1 mmol/L   Chloride 114 (H) 98 - 111 mmol/L   CO2 21 (L) 22 - 32 mmol/L   Glucose, Bld 120 (H) 70 - 99 mg/dL   BUN 21 8 - 23 mg/dL   Creatinine, Ser 1.32 (H) 0.61 - 1.24 mg/dL   Calcium 8.0 (L) 8.9 - 10.3 mg/dL   Total Protein 5.5 (L) 6.5 - 8.1 g/dL   Albumin 2.9 (L) 3.5 - 5.0 g/dL   AST 26 15 - 41 U/L   ALT 11 0 - 44 U/L   Alkaline Phosphatase 99 38 - 126 U/L   Total Bilirubin 0.3 0.3 - 1.2 mg/dL   GFR calc non Af Amer 53 (L) >60 mL/min   GFR calc Af Amer >60 >60 mL/min    Comment: (NOTE) The eGFR  has been calculated using the CKD EPI equation. This calculation has not been validated in all clinical situations. eGFR's persistently <60 mL/min signify possible Chronic Kidney Disease.    Anion gap 5 5 - 15    Comment: Performed at [facility], [address] [place], [place] [address]  CBC with Differential     Status: Abnormal   Collection Time: 04/05/18  7:54 AM  Result Value Ref Range   WBC 3.6 (L) 3.8 - 10.6 K/uL   RBC 2.90 (L) 4.40 - 5.90 MIL/uL   Hemoglobin 9.7 (L) 13.0 - 18.0 g/dL   HCT 27.4 (L) 40.0 - 52.0 %   MCV 94.2 80.0 - 100.0 fL   MCH 33.5 26.0 - 34.0 pg   MCHC 35.6 32.0 - 36.0 g/dL   RDW 16.8 (H) 11.5 - 14.5 %   Platelets 61 (L) 150 - 440 K/uL   Neutrophils Relative % 32 %   Neutro Abs 1.1 (L) 1.4 - 6.5 K/uL   Lymphocytes Relative 46 %   Lymphs Abs 1.7 1.0 - 3.6 K/uL   Monocytes Relative 14 %   Monocytes Absolute 0.5 0.2 - 1.0 K/uL   Eosinophils Relative 6 %   Eosinophils Absolute 0.2 0 - 0.7 K/uL   Basophils Relative 2 %   Basophils Absolute 0.1 0 - 0.1 K/uL    Comment: Performed at [facility], [address] [place], [place] [address]    Radiology No results found.   Assessment/Plan Essential hypertension, benign blood pressure control important in reducing the progression of atherosclerotic disease. On appropriate oral medications.   Chronic systolic CHF (congestive heart failure) (HCC) Likely exacerbating lower extremity swelling. Should discuss with primary care physician   Diabetes ([place]) blood glucose control important in reducing the progression of atherosclerotic disease. Also, involved in wound healing. On appropriate medications.   Hypercholesterolemia lipid control important in reducing the progression of atherosclerotic disease. Continue statin therapy  Lymphedema Persistent ulceration with swelling on the right leg.  Swelling on the left leg not particularly well controlled although there is not ulceration  there either.  Unna boot's were placed today on both lower extremities, 3 layer wraps.  These will be changed weekly.  Reassess in 1 month    Leotis Pain, MD  06/07/2018 11:25 AM  This note was created with Dragon medical transcription system.  Any errors from dictation are purely unintentional

## 2018-06-14 ENCOUNTER — Encounter (INDEPENDENT_AMBULATORY_CARE_PROVIDER_SITE_OTHER): Payer: Self-pay

## 2018-06-14 ENCOUNTER — Ambulatory Visit (INDEPENDENT_AMBULATORY_CARE_PROVIDER_SITE_OTHER): Payer: Medicare Other | Admitting: Nurse Practitioner

## 2018-06-14 VITALS — BP 110/66 | HR 100 | Resp 15 | Ht 69.0 in | Wt 236.0 lb

## 2018-06-14 DIAGNOSIS — R600 Localized edema: Secondary | ICD-10-CM | POA: Diagnosis not present

## 2018-06-14 NOTE — Progress Notes (Signed)
History of Present Illness  There is no documented history at this time  Assessments & Plan   There are no diagnoses linked to this encounter.    Additional instructions  Subjective:  Patient presents with venous ulcer of the Bilateral lower extremity.    Procedure:  3 layer unna wrap was placed Bilateral lower extremity.   Plan:   Follow up in one week.  

## 2018-06-16 DIAGNOSIS — E10649 Type 1 diabetes mellitus with hypoglycemia without coma: Secondary | ICD-10-CM | POA: Diagnosis not present

## 2018-06-16 DIAGNOSIS — E1159 Type 2 diabetes mellitus with other circulatory complications: Secondary | ICD-10-CM | POA: Diagnosis not present

## 2018-06-16 DIAGNOSIS — E785 Hyperlipidemia, unspecified: Secondary | ICD-10-CM | POA: Diagnosis not present

## 2018-06-16 DIAGNOSIS — I10 Essential (primary) hypertension: Secondary | ICD-10-CM | POA: Diagnosis not present

## 2018-06-16 DIAGNOSIS — E1069 Type 1 diabetes mellitus with other specified complication: Secondary | ICD-10-CM | POA: Diagnosis not present

## 2018-06-21 ENCOUNTER — Ambulatory Visit (INDEPENDENT_AMBULATORY_CARE_PROVIDER_SITE_OTHER): Payer: Medicare Other | Admitting: Nurse Practitioner

## 2018-06-21 ENCOUNTER — Encounter (INDEPENDENT_AMBULATORY_CARE_PROVIDER_SITE_OTHER): Payer: Self-pay

## 2018-06-21 VITALS — BP 98/56 | HR 65 | Resp 16 | Ht 70.0 in | Wt 236.0 lb

## 2018-06-21 DIAGNOSIS — R600 Localized edema: Secondary | ICD-10-CM | POA: Diagnosis not present

## 2018-06-21 NOTE — Progress Notes (Signed)
History of Present Illness  There is no documented history at this time  Assessments & Plan   There are no diagnoses linked to this encounter.    Additional instructions  Subjective:  Patient presents with venous ulcer of the Bilateral lower extremity.    Procedure:  3 layer unna wrap was placed Bilateral lower extremity.   Plan:   Follow up in one week.  

## 2018-06-24 ENCOUNTER — Encounter (INDEPENDENT_AMBULATORY_CARE_PROVIDER_SITE_OTHER): Payer: Self-pay | Admitting: Nurse Practitioner

## 2018-06-28 ENCOUNTER — Encounter (INDEPENDENT_AMBULATORY_CARE_PROVIDER_SITE_OTHER): Payer: Self-pay

## 2018-06-28 ENCOUNTER — Ambulatory Visit (INDEPENDENT_AMBULATORY_CARE_PROVIDER_SITE_OTHER): Payer: Medicare Other | Admitting: Nurse Practitioner

## 2018-06-28 VITALS — BP 114/61 | HR 102 | Resp 19 | Ht 70.0 in | Wt 239.0 lb

## 2018-06-28 DIAGNOSIS — I890 Lymphedema, not elsewhere classified: Secondary | ICD-10-CM | POA: Diagnosis not present

## 2018-06-28 DIAGNOSIS — R600 Localized edema: Secondary | ICD-10-CM

## 2018-06-28 NOTE — Progress Notes (Signed)
History of Present Illness  There is no documented history at this time  Assessments & Plan   There are no diagnoses linked to this encounter.    Additional instructions  Subjective:  Patient presents with venous ulcer of the Bilateral lower extremity.    Procedure:  3 layer unna wrap was placed Bilateral lower extremity.   Plan:   Follow up in one week.  

## 2018-07-03 NOTE — Progress Notes (Signed)
[facility]  [phone] [phone]) [phone]  ID: Gary [name] OB: 17-Dec-1945  MR#: [id number]  KLK#:[id number]  Patient Care Team: [name], MD as PCP - General (Internal Medicine) Lloyd Huger, MD as Consulting Physician (Oncology)  CHIEF COMPLAINT: Mantle cell lymphoma.  INTERVAL HISTORY: Patient returns to clinic today for further evaluation and discussion of his imaging results.  He currently feels well and is asymptomatic. He does not complain of weakness or fatigue today.  He denies any fevers, chills, or night sweats. He has a good appetite and denies weight loss. He has no neurologic complaints. He denies any chest pain or shortness of breath. He has no nausea, vomiting, constipation, or diarrhea.  He denies any melena or hematochezia.  He has no urinary complaints.  Patient feels at his baseline offers no specific complaints today.  REVIEW OF SYSTEMS:   Review of Systems  Constitutional: Negative for diaphoresis, fever, malaise/fatigue and weight loss.  Respiratory: Negative.  Negative for cough and shortness of breath.   Cardiovascular: Positive for leg swelling. Negative for chest pain and palpitations.  Gastrointestinal: Negative.  Negative for abdominal pain, blood in stool and melena.  Genitourinary: Negative.  Negative for hematuria.  Musculoskeletal: Negative.  Negative for falls.  Skin: Negative.  Negative for rash.  Neurological: Negative.  Negative for sensory change, focal weakness and weakness.  Endo/Heme/Allergies: Does not bruise/bleed easily.  Psychiatric/Behavioral: Negative.  The patient is not nervous/anxious.     As per HPI. Otherwise, a complete review of systems is negative.  PAST MEDICAL HISTORY: Past Medical History:  Diagnosis Date  . Depression   . 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 Gastroparesis   . Graves disease   . Hypercholesterolemia     . Hypertension   . Mantle cell lymphoma ([place]) 07/2014  . Peripheral neuropathy     PAST SURGICAL HISTORY: Past Surgical History:  Procedure Laterality Date  . CHOLECYSTECTOMY    . NEPHRECTOMY     right  . PACEMAKER INSERTION N/A 12/07/2017   Procedure: INSERTION PACEMAKER DUAL CHAMBER INITIAL INSERT;  Surgeon: Isaias Cowman, MD;  Location: [facility] OR[facility];  Service: Cardiovascular;  Laterality: N/A;  . PORTA CATH INSERTION N/A 11/03/2017   Procedure: PORTA CATH INSERTION;  Surgeon: Katha Cabal, MD;  Location: [facility];  Service: Cardiovascular;  Laterality: N/A;    FAMILY HISTORY Family History  Problem Relation Age of Onset  . Breast cancer Mother   . Diabetes Father   . Cancer Father        Lymphoma  . Alcohol abuse Maternal Uncle   . Diabetes Sister   . Heart disease Unknown        grandfather       ADVANCED DIRECTIVES:    HEALTH MAINTENANCE: Social History   Tobacco Use  . Smoking status: Former Research scientist (life sciences)  . Smokeless tobacco: Current User    Types: Chew  Substance Use Topics  . Alcohol use: Yes    Alcohol/week: 3.0 standard drinks    Types: 3 Cans of beer per week    Comment: nightly  . Drug use: No     Colonoscopy:  PAP:  Bone density:  Lipid panel:  Allergies  Allergen Reactions  . Rofecoxib Nausea Only    Current Outpatient Medications  Medication Sig Dispense Refill  . acyclovir (ZOVIRAX) 400 MG tablet TAKE 1 TABLET BY MOUTH EVERY DAY 30 tablet 3  . carvedilol (COREG)  3.125 MG tablet Take 3.125 mg by mouth 2 (two) times daily with a meal.     . cyanocobalamin (,VITAMIN B-12,) 1000 MCG/ML injection Inject into the muscle.    . diphenhydrAMINE-zinc acetate (BENADRYL EXTRA STRENGTH) cream Apply 1 application topically daily as needed for itching (Place on legs before Unna boot placement).    . ferrous sulfate 325 (65 FE) MG tablet Take 325 mg by mouth daily.    . furosemide (LASIX) 20 MG tablet Take 1 tablet (20 mg total) by  mouth daily. 30 tablet 1  . glucose blood (PRECISION QID TEST) test strip Use 8 (eight) times daily as directed. BAYER CONTOUR NEXT TEST STRIPS E10.649    . insulin aspart (NOVOLOG) 100 UNIT/ML injection Basal rate 12am 0.8 units per hour, 9am 0.9 units per hour. (total basal insulin 20.7 units). Carbohydrate ratio 1 units for every 8gm of carbohydrate. Correction factor 1 units for every 50mg /dl over target cbg. Target CBG 80-120 1 vial 12  . levothyroxine (SYNTHROID, LEVOTHROID) 25 MCG tablet Take 25 mcg by mouth daily.    Marland Kitchen lidocaine-prilocaine (EMLA) cream Apply 1 application topically as needed. Apply to port prior to chemotherapy appointment, cover with plastic wrap. 30 g 2  . lisinopril (PRINIVIL,ZESTRIL) 20 MG tablet Take 20 mg by mouth daily.    . Multiple Vitamin (MULTIVITAMIN) tablet Take 1 tablet by mouth daily.    . pravastatin (PRAVACHOL) 40 MG tablet Take 1 tablet (40 mg total) by mouth at bedtime. Will need office visit for any more refills 30 tablet 0  . spironolactone (ALDACTONE) 25 MG tablet Take 25 mg by mouth daily.    Marland Kitchen ACCU-CHEK AVIVA PLUS test strip USE AS INSTRUCTED. USE 1 STRIP EVERY 3 HOURS . ACCU-CHECK AVIVA PLUS TEST STRIP. DX E10.649  12  . GLUCAGON EMERGENCY 1 MG injection     . glucose 4 GM chewable tablet Chew 1 tablet by mouth once as needed for low blood sugar.    . naproxen sodium (ALEVE) 220 MG tablet Take 220 mg by mouth daily as needed.    . prochlorperazine (COMPAZINE) 10 MG tablet TAKE 1 TABLET (10 MG TOTAL) BY MOUTH EVERY 6 (SIX) HOURS AS NEEDED (NAUSEA OR VOMITING).     No current facility-administered medications for this visit.    Facility-Administered Medications Ordered in Other Visits  Medication Dose Route Frequency Provider Last Rate Last Dose  . heparin lock flush 100 unit/mL  500 Units Intravenous Once Lloyd Huger, MD      . sodium chloride flush (NS) 0.9 % injection 10 mL  10 mL Intravenous PRN Lloyd Huger, MD   10 mL at  02/01/18 0829  . sodium chloride flush (NS) 0.9 % injection 10 mL  10 mL Intravenous PRN Lloyd Huger, MD   10 mL at 04/05/18 0800  Race: Please given the multiple things to do this would like to do a little bit for myself before do anything  OBJECTIVE: Vitals:   07/06/18 1042  BP: 135/73  Pulse: 83  Resp: 18  Temp: (!) 96.4 F (35.8 C)     Body mass index is 34.91 kg/m.    ECOG FS:0 - Asymptomatic  General: Well-developed, well-nourished, no acute distress. Eyes: Pink conjunctiva, anicteric sclera. HEENT: Normocephalic, moist mucous membranes. Chest wall: Pacemaker and port in place.  No obvious erythema. Lungs: Clear to auscultation bilaterally. Heart: Regular rate and rhythm. No rubs, murmurs, or gallops. Abdomen: Soft, nontender, nondistended. No organomegaly noted, normoactive bowel  sounds. Musculoskeletal: No edema, cyanosis, or clubbing. Neuro: Alert, answering all questions appropriately. Cranial nerves grossly intact. Skin: No rashes or petechiae noted. Psych: Normal affect. Lymphatics: No cervical, calvicular, axillary or inguinal LAD.  LAB RESULTS:  Lab Results  Component Value Date   NA 139 07/04/2018   K 3.2 (L) 07/04/2018   CL 112 (H) 07/04/2018   CO2 20 (L) 07/04/2018   GLUCOSE 116 (H) 07/04/2018   BUN 24 (H) 07/04/2018   CREATININE 1.50 (H) 07/04/2018   CALCIUM 8.1 (L) 07/04/2018   PROT 6.1 (L) 07/04/2018   ALBUMIN 3.5 07/04/2018   AST 27 07/04/2018   ALT 15 07/04/2018   ALKPHOS 121 07/04/2018   BILITOT 0.8 07/04/2018   GFRNONAA 46 (L) 07/04/2018   GFRAA 53 (L) 07/04/2018    Lab Results  Component Value Date   WBC 2.1 (L) 07/04/2018   NEUTROABS 0.8 (L) 07/04/2018   HGB 10.8 (L) 07/04/2018   HCT 30.2 (L) 07/04/2018   MCV 92.1 07/04/2018   PLT 98 (L) 07/04/2018   Lab Results  Component Value Date   IRON 13 (L) 08/11/2017   TIBC 223 (L) 01/12/2017   IRONPCTSAT 3.8 (L) 08/11/2017   Lab Results  Component Value Date   FERRITIN 42.0  08/11/2017     STUDIES:   ASSESSMENT: Low-grade mantle cell lymphoma diagnosed in September 2015, iron deficiency anemia.  PLAN:    1. Mantle cell lymphoma, stage III: Patient's initial diagnosis was on inguinal lymph node biopsy on July 12, 2014.  Patient was noted to have progressive disease and underwent chemotherapy using Rituxan and Treanda completing cycle 4 on Mar 09, 2018.  CT scan results from July 04, 2018 reviewed independently with no evidence of recurrent or progressive disease.  No intervention is needed at this time.  Return to clinic in 3 months for laboratory work only and then in 6 months with repeat imaging and further evaluation.   2. Decreased ejection fraction: Chronic and unchanged.  Previously patient had an ejection fraction of 40% with no clinical CHF.  Continue monitoring and evaluation per cardiology. 3. Diabetes: Patient has improved blood glucose control. Continue insulin pump as directed. 4. Thrombocytopenia: Platelet count remains decreased, but slightly improved to 98.  No treatment as above. 5.  Anemia: Patient's hemoglobin has mildly improved to 10.8, monitor. 6.  Leukopenia: Patient's white blood cell count has decreased to 2.1, monitor. 7.  Bradycardia: Patient has pacemaker in place. 8.  Renal insufficiency: Patient's creatinine is mildly increased at 1.5, monitor.  Patient expressed understanding and was in agreement with this plan. He also understands that He can call clinic at any time with any questions, concerns, or complaints.    Lloyd Huger, MD   07/10/2018 7:34 AM

## 2018-07-04 ENCOUNTER — Encounter: Payer: Self-pay | Admitting: Podiatry

## 2018-07-04 ENCOUNTER — Ambulatory Visit (INDEPENDENT_AMBULATORY_CARE_PROVIDER_SITE_OTHER): Payer: Medicare Other | Admitting: Podiatry

## 2018-07-04 ENCOUNTER — Inpatient Hospital Stay: Payer: Medicare Other | Attending: Oncology

## 2018-07-04 ENCOUNTER — Ambulatory Visit
Admission: RE | Admit: 2018-07-04 | Discharge: 2018-07-04 | Disposition: A | Discharge status: Home / Self Care | Payer: Medicare Other | Source: Ambulatory Visit | Attending: Oncology | Admitting: Oncology

## 2018-07-04 DIAGNOSIS — Z87891 Personal history of nicotine dependence: Secondary | ICD-10-CM | POA: Insufficient documentation

## 2018-07-04 DIAGNOSIS — S2231XD Fracture of one rib, right side, subsequent encounter for fracture with routine healing: Secondary | ICD-10-CM | POA: Insufficient documentation

## 2018-07-04 DIAGNOSIS — Z79899 Other long term (current) drug therapy: Secondary | ICD-10-CM | POA: Diagnosis not present

## 2018-07-04 DIAGNOSIS — B351 Tinea unguium: Secondary | ICD-10-CM

## 2018-07-04 DIAGNOSIS — E7800 Pure hypercholesterolemia, unspecified: Secondary | ICD-10-CM | POA: Insufficient documentation

## 2018-07-04 DIAGNOSIS — Z905 Acquired absence of kidney: Secondary | ICD-10-CM | POA: Insufficient documentation

## 2018-07-04 DIAGNOSIS — R001 Bradycardia, unspecified: Secondary | ICD-10-CM | POA: Insufficient documentation

## 2018-07-04 DIAGNOSIS — Z807 Family history of other malignant neoplasms of lymphoid, hematopoietic and related tissues: Secondary | ICD-10-CM | POA: Insufficient documentation

## 2018-07-04 DIAGNOSIS — Z950 Presence of cardiac pacemaker: Secondary | ICD-10-CM | POA: Insufficient documentation

## 2018-07-04 DIAGNOSIS — C8318 Mantle cell lymphoma, lymph nodes of multiple sites: Secondary | ICD-10-CM

## 2018-07-04 DIAGNOSIS — R600 Localized edema: Secondary | ICD-10-CM | POA: Insufficient documentation

## 2018-07-04 DIAGNOSIS — R911 Solitary pulmonary nodule: Secondary | ICD-10-CM | POA: Insufficient documentation

## 2018-07-04 DIAGNOSIS — C8310 Mantle cell lymphoma, unspecified site: Secondary | ICD-10-CM | POA: Diagnosis not present

## 2018-07-04 DIAGNOSIS — S2243XA Multiple fractures of ribs, bilateral, initial encounter for closed fracture: Secondary | ICD-10-CM | POA: Diagnosis not present

## 2018-07-04 DIAGNOSIS — R590 Localized enlarged lymph nodes: Secondary | ICD-10-CM | POA: Diagnosis not present

## 2018-07-04 DIAGNOSIS — I2510 Atherosclerotic heart disease of native coronary artery without angina pectoris: Secondary | ICD-10-CM | POA: Insufficient documentation

## 2018-07-04 DIAGNOSIS — Z803 Family history of malignant neoplasm of breast: Secondary | ICD-10-CM | POA: Diagnosis not present

## 2018-07-04 DIAGNOSIS — Z9641 Presence of insulin pump (external) (internal): Secondary | ICD-10-CM | POA: Diagnosis not present

## 2018-07-04 DIAGNOSIS — N289 Disorder of kidney and ureter, unspecified: Secondary | ICD-10-CM | POA: Insufficient documentation

## 2018-07-04 DIAGNOSIS — D61818 Other pancytopenia: Secondary | ICD-10-CM | POA: Diagnosis not present

## 2018-07-04 DIAGNOSIS — M79674 Pain in right toe(s): Secondary | ICD-10-CM

## 2018-07-04 DIAGNOSIS — E1142 Type 2 diabetes mellitus with diabetic polyneuropathy: Secondary | ICD-10-CM

## 2018-07-04 DIAGNOSIS — I10 Essential (primary) hypertension: Secondary | ICD-10-CM | POA: Insufficient documentation

## 2018-07-04 DIAGNOSIS — I700 Atherosclerosis of aorta: Secondary | ICD-10-CM | POA: Diagnosis not present

## 2018-07-04 DIAGNOSIS — J439 Emphysema, unspecified: Secondary | ICD-10-CM | POA: Diagnosis not present

## 2018-07-04 DIAGNOSIS — M79675 Pain in left toe(s): Secondary | ICD-10-CM | POA: Diagnosis not present

## 2018-07-04 DIAGNOSIS — E1143 Type 2 diabetes mellitus with diabetic autonomic (poly)neuropathy: Secondary | ICD-10-CM | POA: Insufficient documentation

## 2018-07-04 LAB — COMPREHENSIVE METABOLIC PANEL
ALK PHOS: 121 U/L (ref 38–126)
ALT: 15 U/L (ref 0–44)
ANION GAP: 7 (ref 5–15)
AST: 27 U/L (ref 15–41)
Albumin: 3.5 g/dL (ref 3.5–5.0)
BUN: 24 mg/dL — ABNORMAL HIGH (ref 8–23)
CALCIUM: 8.1 mg/dL — AB (ref 8.9–10.3)
CO2: 20 mmol/L — ABNORMAL LOW (ref 22–32)
Chloride: 112 mmol/L — ABNORMAL HIGH (ref 98–111)
Creatinine, Ser: 1.5 mg/dL — ABNORMAL HIGH (ref 0.61–1.24)
GFR, EST AFRICAN AMERICAN: 53 mL/min — AB (ref >60)
GFR, EST NON AFRICAN AMERICAN: 46 mL/min — AB (ref >60)
Glucose, Bld: 116 mg/dL — ABNORMAL HIGH (ref 70–99)
Potassium: 3.2 mmol/L — ABNORMAL LOW (ref 3.5–5.1)
SODIUM: 139 mmol/L (ref 135–145)
Total Bilirubin: 0.8 mg/dL (ref 0.3–1.2)
Total Protein: 6.1 g/dL — ABNORMAL LOW (ref 6.5–8.1)

## 2018-07-04 LAB — CBC WITH DIFFERENTIAL/PLATELET
BASOS ABS: 0.1 10*3/uL (ref 0–0.1)
BASOS PCT: 3 %
EOS ABS: 0.1 10*3/uL (ref 0–0.7)
Eosinophils Relative: 7 %
HCT: 30.2 % — ABNORMAL LOW (ref 40.0–52.0)
HEMOGLOBIN: 10.8 g/dL — AB (ref 13.0–18.0)
LYMPHS PCT: 34 %
Lymphs Abs: 0.7 10*3/uL — ABNORMAL LOW (ref 1.0–3.6)
MCH: 33.1 pg (ref 26.0–34.0)
MCHC: 35.9 g/dL (ref 32.0–36.0)
MCV: 92.1 fL (ref 80.0–100.0)
MONO ABS: 0.4 10*3/uL (ref 0.2–1.0)
Monocytes Relative: 17 %
NEUTROS PCT: 39 %
Neutro Abs: 0.8 10*3/uL — ABNORMAL LOW (ref 1.4–6.5)
Platelets: 98 10*3/uL — ABNORMAL LOW (ref 150–440)
RBC: 3.28 MIL/uL — AB (ref 4.40–5.90)
RDW: 14.1 % (ref 11.5–14.5)
WBC: 2.1 10*3/uL — AB (ref 3.8–10.6)

## 2018-07-04 MED ORDER — HEPARIN SOD (PORK) LOCK FLUSH 100 UNIT/ML IV SOLN
500.0000 [IU] | Freq: Once | INTRAVENOUS | Status: AC
  Administered 2018-07-04: 500 [IU] via INTRAVENOUS
  Filled 2018-07-04: qty 5

## 2018-07-04 MED ORDER — SODIUM CHLORIDE 0.9% FLUSH
10.0000 mL | Freq: Once | INTRAVENOUS | Status: AC
  Administered 2018-07-04: 10 mL via INTRAVENOUS
  Filled 2018-07-04: qty 10

## 2018-07-04 MED ORDER — IOPAMIDOL (ISOVUE-300) INJECTION 61%
75.0000 mL | Freq: Once | INTRAVENOUS | Status: AC | PRN
  Administered 2018-07-04: 75 mL via INTRAVENOUS

## 2018-07-04 NOTE — Progress Notes (Signed)
Complaint:  Visit Type: Patient returns to my office for continued preventative foot care services. Complaint: Patient states" my nails have grown long and thick and become painful to walk and wear shoes" Patient has been diagnosed with DM with no foot complications. Patient is wearing unna boots on both legs and feet. He says these are changed weekly and is due to have them changed Tuesday.  Patient nails and toes are dirty since he is unable to bathe because of the unna boots  B/L.  The patient presents for preventative foot care services. No changes to ROS  Podiatric Exam: Vascular: deferred this visit.  Sensorium: Normal Semmes Weinstein monofilament test. Normal tactile sensation bilaterally. Nail Exam: Pt has thick disfigured discolored nails with subungual debris noted bilateral entire nail hallux through fifth toenails Ulcer Exam: There is no evidence of ulcer or pre-ulcerative changes or infection. Orthopedic Exam: Muscle tone and strength are WNL. No limitations in general ROM. No crepitus or effusions noted. Foot type and digits show no abnormalities. Bony prominences are unremarkable. Skin: No Porokeratosis. No infection or ulcers  Diagnosis:  Onychomycosis, , Pain in right toe, pain in left toes  Treatment & Plan Procedures and Treatment: Consent by patient was obtained for treatment procedures.   Debridement of mycotic and hypertrophic toenails, 1 through 5 bilateral and clearing of subungual debris. No ulceration, no infection noted. ABN signed for 2019. Subungual blister left hallux.  Bandaged with neosporin/DSD. Return Visit-Office Procedure: Patient instructed to return to the office for a follow up visit 3 months for continued evaluation and treatment.    [name] DPM

## 2018-07-05 ENCOUNTER — Ambulatory Visit (INDEPENDENT_AMBULATORY_CARE_PROVIDER_SITE_OTHER): Payer: Medicare Other | Admitting: Vascular Surgery

## 2018-07-05 ENCOUNTER — Encounter (INDEPENDENT_AMBULATORY_CARE_PROVIDER_SITE_OTHER): Payer: Self-pay | Admitting: Vascular Surgery

## 2018-07-05 VITALS — BP 116/64 | HR 68 | Resp 18 | Ht 70.0 in | Wt 243.0 lb

## 2018-07-05 DIAGNOSIS — E11622 Type 2 diabetes mellitus with other skin ulcer: Secondary | ICD-10-CM

## 2018-07-05 DIAGNOSIS — I2510 Atherosclerotic heart disease of native coronary artery without angina pectoris: Secondary | ICD-10-CM | POA: Diagnosis not present

## 2018-07-05 DIAGNOSIS — I5022 Chronic systolic (congestive) heart failure: Secondary | ICD-10-CM | POA: Diagnosis not present

## 2018-07-05 DIAGNOSIS — L97319 Non-pressure chronic ulcer of right ankle with unspecified severity: Secondary | ICD-10-CM | POA: Diagnosis not present

## 2018-07-05 DIAGNOSIS — E1151 Type 2 diabetes mellitus with diabetic peripheral angiopathy without gangrene: Secondary | ICD-10-CM | POA: Diagnosis not present

## 2018-07-05 DIAGNOSIS — I110 Hypertensive heart disease with heart failure: Secondary | ICD-10-CM | POA: Diagnosis not present

## 2018-07-05 DIAGNOSIS — I890 Lymphedema, not elsewhere classified: Secondary | ICD-10-CM | POA: Diagnosis not present

## 2018-07-05 DIAGNOSIS — I10 Essential (primary) hypertension: Secondary | ICD-10-CM

## 2018-07-05 NOTE — Progress Notes (Signed)
MRN : [id number]  Gary [name] is a 72 y.o. (07-23-1946) male who presents with chief complaint of  Chief Complaint  Patient presents with  . Follow-up    One month Unna boot check  .  History of Present Illness: Patient returns today in follow up of lower extremity swelling and ulceration.  The Unna boots have done a reasonably good job of controlling his swelling.  He has not been using his lymphedema pump.  The ulcerations are not entirely healed on the right but they are close.  I do not see any open ulcerations on the left other than a blister on his left great toe after manipulation of his toenail by podiatry.  No erythema.  Not that painful.  No fevers or chills.  Current Outpatient Medications  Medication Sig Dispense Refill  . ACCU-CHEK AVIVA PLUS test strip USE AS INSTRUCTED. USE 1 STRIP EVERY 3 HOURS . ACCU-CHECK AVIVA PLUS TEST STRIP. DX E10.649  12  . acyclovir (ZOVIRAX) 400 MG tablet TAKE 1 TABLET BY MOUTH EVERY DAY 30 tablet 3  . carvedilol (COREG) 3.125 MG tablet Take 3.125 mg by mouth 2 (two) times daily with a meal.     . cyanocobalamin (,VITAMIN B-12,) 1000 MCG/ML injection Inject into the muscle.    . diphenhydrAMINE-zinc acetate (BENADRYL EXTRA STRENGTH) cream Apply 1 application topically daily as needed for itching (Place on legs before Unna boot placement).    . ferrous sulfate 325 (65 FE) MG tablet Take 325 mg by mouth daily.    . furosemide (LASIX) 20 MG tablet Take 1 tablet (20 mg total) by mouth daily. 30 tablet 1  . GLUCAGON EMERGENCY 1 MG injection     . glucose 4 GM chewable tablet Chew 1 tablet by mouth once as needed for low blood sugar.    Marland Kitchen glucose blood (PRECISION QID TEST) test strip Use 8 (eight) times daily as directed. BAYER CONTOUR NEXT TEST STRIPS E10.649    . insulin aspart (NOVOLOG) 100 UNIT/ML injection Basal rate 12am 0.8 units per hour, 9am 0.9 units per hour. (total basal insulin 20.7 units). Carbohydrate ratio 1 units for every 8gm of  carbohydrate. Correction factor 1 units for every 56m/dl over target cbg. Target CBG 80-120 1 vial 12  . levothyroxine (SYNTHROID, LEVOTHROID) 25 MCG tablet Take 25 mcg by mouth daily.    .Marland Kitchenlidocaine-prilocaine (EMLA) cream Apply 1 application topically as needed. Apply to port prior to chemotherapy appointment, cover with plastic wrap. 30 g 2  . lisinopril (PRINIVIL,ZESTRIL) 20 MG tablet Take 20 mg by mouth daily.    . Multiple Vitamin (MULTIVITAMIN) tablet Take 1 tablet by mouth daily.    . naproxen sodium (ALEVE) 220 MG tablet Take 220 mg by mouth daily as needed.    . pravastatin (PRAVACHOL) 40 MG tablet Take 1 tablet (40 mg total) by mouth at bedtime. Will need office visit for any more refills 30 tablet 0  . prochlorperazine (COMPAZINE) 10 MG tablet TAKE 1 TABLET (10 MG TOTAL) BY MOUTH EVERY 6 (SIX) HOURS AS NEEDED (NAUSEA OR VOMITING).    .Marland Kitchenspironolactone (ALDACTONE) 25 MG tablet Take 25 mg by mouth daily.     No current facility-administered medications for this visit.    Facility-Administered Medications Ordered in Other Visits  Medication Dose Route Frequency Provider Last Rate Last Dose  . heparin lock flush 100 unit/mL  500 Units Intravenous Once FLloyd Huger MD      . sodium chloride flush (  NS) 0.9 % injection 10 mL  10 mL Intravenous PRN Lloyd Huger, MD   10 mL at 02/01/18 0829  . sodium chloride flush (NS) 0.9 % injection 10 mL  10 mL Intravenous PRN Lloyd Huger, MD   10 mL at 04/05/18 0800    Past Medical History:  Diagnosis Date  . Depression   . 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 Gastroparesis   . Graves disease   . Hypercholesterolemia   . Hypertension   . Mantle cell lymphoma ([place]) 07/2014  . Peripheral neuropathy     Past Surgical History:  Procedure Laterality Date  . CHOLECYSTECTOMY    . NEPHRECTOMY     right  . PACEMAKER INSERTION N/A 12/07/2017   Procedure: INSERTION  PACEMAKER DUAL CHAMBER INITIAL INSERT;  Surgeon: Isaias Cowman, MD;  Location: [facility];  Service: Cardiovascular;  Laterality: N/A;  . PORTA CATH INSERTION N/A 11/03/2017   Procedure: PORTA CATH INSERTION;  Surgeon: Katha Cabal, MD;  Location: [facility];  Service: Cardiovascular;  Laterality: N/A;    Social History        Tobacco Use  . Smoking status: Former Research scientist (life sciences)  . Smokeless tobacco: Current User    Types: Chew  Substance Use Topics  . Alcohol use: Yes    Alcohol/week: 3.0 standard drinks    Types: 3 Cans of beer per week    Comment: nightly  . Drug use: No     Family History      Family History  Problem Relation Age of Onset  . Breast cancer Mother   . Diabetes Father   . Cancer Father        Lymphoma  . Alcohol abuse Maternal Uncle   . Diabetes Sister   . Heart disease Unknown        grandfather           Current Outpatient Medications  Medication Sig Dispense Refill  . ACCU-CHEK AVIVA PLUS test strip USE AS INSTRUCTED. USE 1 STRIP EVERY 3 HOURS . ACCU-CHECK AVIVA PLUS TEST STRIP. DX E10.649  12  . acyclovir (ZOVIRAX) 400 MG tablet TAKE 1 TABLET BY MOUTH EVERY DAY 30 tablet 3  . carvedilol (COREG) 3.125 MG tablet Take 3.125 mg by mouth 2 (two) times daily with a meal.     . cyanocobalamin (,VITAMIN B-12,) 1000 MCG/ML injection Inject into the muscle.    . diphenhydrAMINE-zinc acetate (BENADRYL EXTRA STRENGTH) cream Apply 1 application topically daily as needed for itching (Place on legs before Unna boot placement).    . ferrous sulfate 325 (65 FE) MG tablet Take 325 mg by mouth daily.    . furosemide (LASIX) 20 MG tablet Take 1 tablet (20 mg total) by mouth daily. 30 tablet 1  . GLUCAGON EMERGENCY 1 MG injection     . glucose 4 GM chewable tablet Chew 1 tablet by mouth once as needed for low blood sugar.    Marland Kitchen glucose blood (PRECISION QID TEST) test strip Use 8 (eight) times daily as directed. BAYER  CONTOUR NEXT TEST STRIPS E10.649    . insulin aspart (NOVOLOG) 100 UNIT/ML injection Basal rate 12am 0.8 units per hour, 9am 0.9 units per hour. (total basal insulin 20.7 units). Carbohydrate ratio 1 units for every 8gm of carbohydrate. Correction factor 1 units for every 6m/dl over target cbg. Target CBG 80-120 1 vial 12  . levothyroxine (SYNTHROID, LEVOTHROID) 25 MCG tablet Take 25  mcg by mouth daily.    Marland Kitchen lidocaine-prilocaine (EMLA) cream Apply 1 application topically as needed. Apply to port prior to chemotherapy appointment, cover with plastic wrap. 30 g 2  . lisinopril (PRINIVIL,ZESTRIL) 20 MG tablet Take 20 mg by mouth daily.    . Multiple Vitamin (MULTIVITAMIN) tablet Take 1 tablet by mouth daily.    . naproxen sodium (ALEVE) 220 MG tablet Take 220 mg by mouth daily as needed.    . pravastatin (PRAVACHOL) 40 MG tablet Take 1 tablet (40 mg total) by mouth at bedtime. Will need office visit for any more refills 30 tablet 0  . prochlorperazine (COMPAZINE) 10 MG tablet TAKE 1 TABLET (10 MG TOTAL) BY MOUTH EVERY 6 (SIX) HOURS AS NEEDED (NAUSEA OR VOMITING).    Marland Kitchen spironolactone (ALDACTONE) 25 MG tablet Take 25 mg by mouth daily.     No current facility-administered medications for this visit.             Facility-Administered Medications Ordered in Other Visits  Medication Dose Route Frequency Provider Last Rate Last Dose  . heparin lock flush 100 unit/mL  500 Units Intravenous Once Lloyd Huger, MD      . sodium chloride flush (NS) 0.9 % injection 10 mL  10 mL Intravenous PRN Lloyd Huger, MD   10 mL at 02/01/18 0829  . sodium chloride flush (NS) 0.9 % injection 10 mL  10 mL Intravenous PRN Lloyd Huger, MD   10 mL at 04/05/18 0800        Allergies  Allergen Reactions  . Rofecoxib Nausea Only    REVIEW OF SYSTEMS(Negative unless checked)  Constitutional: [] Weight loss[] Fever[] Chills Cardiac:[] Chest pain[] Chest  pressure[] Palpitations [] Shortness of breath when laying flat [] Shortness of breath at rest [x] Shortness of breath with exertion. Vascular: [] Pain in legs with walking[] Pain in legsat rest[] Pain in legs when laying flat [] Claudication [] Pain in feet when walking [] Pain in feet at rest [] Pain in feet when laying flat [] History of DVT [] Phlebitis [x] Swelling in legs [] Varicose veins [x] Non-healing ulcers Pulmonary: [] Uses home oxygen [] Productive cough[] Hemoptysis [] Wheeze [] COPD [] Asthma Neurologic: [] Dizziness [] Blackouts [] Seizures [] History of stroke [] History of TIA[] Aphasia [] Temporary blindness[] Dysphagia [] Weaknessor numbness in arms [] Weakness or numbnessin legs Musculoskeletal: [] Arthritis [] Joint swelling [] Joint pain [] Low back pain Hematologic:[] Easy bruising[] Easy bleeding [] Hypercoagulable state [] Anemic  Gastrointestinal:[] Blood in stool[] Vomiting blood[] Gastroesophageal reflux/heartburn[] Abdominal pain Genitourinary: [] Chronic kidney disease [] Difficulturination [] Frequenturination [] Burning with urination[] Hematuria Skin: [] Rashes [x] Ulcers [x] Wounds Psychological: [] History of anxiety[] History of major depression.    Physical Examination  BP 116/64 (BP Location: Right Arm, Patient Position: Sitting)   Pulse 68   Resp 18   Ht 5' 10"  (1.778 m)   Wt 243 lb (110.2 kg)   BMI 34.87 kg/m  Gen:  WD/WN, NAD Head: /AT, No temporalis wasting. Ear/Nose/Throat: Hearing grossly intact, nares w/o erythema or drainage Eyes: Conjunctiva clear. Sclera non-icteric Neck: Supple.  Trachea midline Pulmonary:  Good air movement, no use of accessory muscles.  Cardiac: RRR, no JVD Vascular:  Vessel Right Left  Radial Palpable Palpable                                    Musculoskeletal: M/S 5/5 throughout.  No deformity or atrophy. 1-2+ BLE edema. Neurologic: Sensation  grossly intact in extremities.  Symmetrical.  Speech is fluent.  Psychiatric: Judgment intact, Mood & affect appropriate for pt's clinical situation. Dermatologic: A couple of small superficial ulcerations on the right posterior and lateral calf  and lower leg area.  Minimal drainage at this point.       Labs Recent Results (from the past 2160 hour(s))  Comprehensive metabolic panel     Status: Abnormal   Collection Time: 07/04/18 10:41 AM  Result Value Ref Range   Sodium 139 135 - 145 mmol/L   Potassium 3.2 (L) 3.5 - 5.1 mmol/L   Chloride 112 (H) 98 - 111 mmol/L   CO2 20 (L) 22 - 32 mmol/L   Glucose, Bld 116 (H) 70 - 99 mg/dL   BUN 24 (H) 8 - 23 mg/dL   Creatinine, Ser 1.50 (H) 0.61 - 1.24 mg/dL   Calcium 8.1 (L) 8.9 - 10.3 mg/dL   Total Protein 6.1 (L) 6.5 - 8.1 g/dL   Albumin 3.5 3.5 - 5.0 g/dL   AST 27 15 - 41 U/L   ALT 15 0 - 44 U/L   Alkaline Phosphatase 121 38 - 126 U/L   Total Bilirubin 0.8 0.3 - 1.2 mg/dL   GFR calc non Af Amer 46 (L) >60 mL/min   GFR calc Af Amer 53 (L) >60 mL/min    Comment: (NOTE) The eGFR has been calculated using the CKD EPI equation. This calculation has not been validated in all clinical situations. eGFR's persistently <60 mL/min signify possible Chronic Kidney Disease.    Anion gap 7 5 - 15    Comment: Performed at [facility], [address] [place], [place] [address]  CBC with Differential     Status: Abnormal   Collection Time: 07/04/18 10:41 AM  Result Value Ref Range   WBC 2.1 (L) 3.8 - 10.6 K/uL   RBC 3.28 (L) 4.40 - 5.90 MIL/uL   Hemoglobin 10.8 (L) 13.0 - 18.0 g/dL   HCT 30.2 (L) 40.0 - 52.0 %   MCV 92.1 80.0 - 100.0 fL   MCH 33.1 26.0 - 34.0 pg   MCHC 35.9 32.0 - 36.0 g/dL   RDW 14.1 11.5 - 14.5 %   Platelets 98 (L) 150 - 440 K/uL   Neutrophils Relative % 39 %   Lymphocytes Relative 34 %   Monocytes Relative 17 %   Eosinophils Relative 7 %   Basophils Relative 3 %   Neutro Abs 0.8 (L) 1.4 - 6.5 K/uL   Lymphs  Abs 0.7 (L) 1.0 - 3.6 K/uL   Monocytes Absolute 0.4 0.2 - 1.0 K/uL   Eosinophils Absolute 0.1 0 - 0.7 K/uL   Basophils Absolute 0.1 0 - 0.1 K/uL   Smear Review SMEAR SCANNED     Comment: Performed at [facility], [address] [place], [place] [address]    Radiology Ct Chest W Contrast  Result Date: 07/04/2018 CLINICAL DATA:  Mantle cell lymphoma restaging EXAM: CT CHEST, ABDOMEN, AND PELVIS WITH CONTRAST TECHNIQUE: Multidetector CT imaging of the chest, abdomen and pelvis was performed following the standard protocol during bolus administration of intravenous contrast. CONTRAST:  42m ISOVUE-300 IOPAMIDOL (ISOVUE-300) INJECTION 61% COMPARISON:  04/01/2018 FINDINGS: CT CHEST FINDINGS Cardiovascular: Dual lead pacer. Right Port-A-Cath tip: Cavoatrial junction. Left anterior descending and right coronary artery atherosclerotic calcification. Mediastinum/Nodes: There is evidence of old granulomatous disease. Lungs/Pleura: Paraseptal emphysema 6 mm ground-glass density pulmonary nodule in the left lower lobe on image 65/3, not appreciable on 04/01/2018. Musculoskeletal: Nonunited fracture the right proximal humerus. A right anterior sixth rib fracture with adjacent sclerosis is observed on image 110/3 and is new compared to 04/01/2018. Healing right anterior fifth rib fracture was also present previously.  Old healed left-sided rib fractures. Thoracic kyphosis. Bridging spurring at several lower thoracic spine levels. CT ABDOMEN PELVIS FINDINGS Hepatobiliary: Cholecystectomy.  Otherwise unremarkable. Pancreas: Unremarkable Spleen: Unremarkable Adrenals/Urinary Tract: Both adrenal glands appear normal. Right nephrectomy. Small exophytic hypodense lesion from the left kidney lower pole is likely a cyst and appears stable. Stomach/Bowel: Unremarkable Vascular/Lymphatic: Aortoiliac atherosclerotic vascular disease. Left common iliac node 1.6 cm in short axis on image 88/2, stable. Left para-aortic node  0.9 cm in short axis on image 83/2, stable. Right inguinal lymph node 1.3 cm in short axis on image 110/2, previously 1.2 cm. Indistinctly marginated left external iliac node 1.3 cm in short axis on image 102/2, previously 1.4 cm. Reproductive: Unremarkable Other: No supplemental non-categorized findings. Musculoskeletal: Unremarkable IMPRESSION: 1. Stable mildly abnormal lower retroperitoneal and pelvic adenopathy compared to the prior exam. 2. 6 mm in diameter ground-glass density pulmonary nodule in the left lower lobe is new compared to 04/01/2018. Initial follow-up with CT at 6-12 months is recommended to confirm persistence. If persistent, repeat CT is recommended every 2 years until 5 years of stability has been established. This recommendation follows the consensus statement: Guidelines for Management of Incidental Pulmonary Nodules Detected on CT Images: From the Fleischner Society 2017; Radiology 2017; 284:228-243. 3. Other imaging findings of potential clinical significance: Aortic Atherosclerosis (ICD10-I70.0). Coronary atherosclerosis. Emphysema (ICD10-J43.9). Nonunited fracture the right proximal humerus. New healing right anterior sixth rib fracture. Additional old bilateral rib fractures. Right nephrectomy. Electronically Signed   By: Van Clines M.D.   On: 07/04/2018 14:32   Ct Abdomen Pelvis W Contrast  Result Date: 07/04/2018 CLINICAL DATA:  Mantle cell lymphoma restaging EXAM: CT CHEST, ABDOMEN, AND PELVIS WITH CONTRAST TECHNIQUE: Multidetector CT imaging of the chest, abdomen and pelvis was performed following the standard protocol during bolus administration of intravenous contrast. CONTRAST:  28m ISOVUE-300 IOPAMIDOL (ISOVUE-300) INJECTION 61% COMPARISON:  04/01/2018 FINDINGS: CT CHEST FINDINGS Cardiovascular: Dual lead pacer. Right Port-A-Cath tip: Cavoatrial junction. Left anterior descending and right coronary artery atherosclerotic calcification. Mediastinum/Nodes: There is  evidence of old granulomatous disease. Lungs/Pleura: Paraseptal emphysema 6 mm ground-glass density pulmonary nodule in the left lower lobe on image 65/3, not appreciable on 04/01/2018. Musculoskeletal: Nonunited fracture the right proximal humerus. A right anterior sixth rib fracture with adjacent sclerosis is observed on image 110/3 and is new compared to 04/01/2018. Healing right anterior fifth rib fracture was also present previously. Old healed left-sided rib fractures. Thoracic kyphosis. Bridging spurring at several lower thoracic spine levels. CT ABDOMEN PELVIS FINDINGS Hepatobiliary: Cholecystectomy.  Otherwise unremarkable. Pancreas: Unremarkable Spleen: Unremarkable Adrenals/Urinary Tract: Both adrenal glands appear normal. Right nephrectomy. Small exophytic hypodense lesion from the left kidney lower pole is likely a cyst and appears stable. Stomach/Bowel: Unremarkable Vascular/Lymphatic: Aortoiliac atherosclerotic vascular disease. Left common iliac node 1.6 cm in short axis on image 88/2, stable. Left para-aortic node 0.9 cm in short axis on image 83/2, stable. Right inguinal lymph node 1.3 cm in short axis on image 110/2, previously 1.2 cm. Indistinctly marginated left external iliac node 1.3 cm in short axis on image 102/2, previously 1.4 cm. Reproductive: Unremarkable Other: No supplemental non-categorized findings. Musculoskeletal: Unremarkable IMPRESSION: 1. Stable mildly abnormal lower retroperitoneal and pelvic adenopathy compared to the prior exam. 2. 6 mm in diameter ground-glass density pulmonary nodule in the left lower lobe is new compared to 04/01/2018. Initial follow-up with CT at 6-12 months is recommended to confirm persistence. If persistent, repeat CT is recommended every 2 years until 5 years of stability  has been established. This recommendation follows the consensus statement: Guidelines for Management of Incidental Pulmonary Nodules Detected on CT Images: From the Fleischner  Society 2017; Radiology 2017; 284:228-243. 3. Other imaging findings of potential clinical significance: Aortic Atherosclerosis (ICD10-I70.0). Coronary atherosclerosis. Emphysema (ICD10-J43.9). Nonunited fracture the right proximal humerus. New healing right anterior sixth rib fracture. Additional old bilateral rib fractures. Right nephrectomy. Electronically Signed   By: Van Clines M.D.   On: 07/04/2018 14:32    Assessment/Plan Essential hypertension, benign blood pressure control important in reducing the progression of atherosclerotic disease. On appropriate oral medications.   Chronic systolic CHF (congestive heart failure) (HCC) Likely exacerbating lower extremity swelling. Should discuss with primary care physician   Diabetes ([place]) blood glucose control important in reducing the progression of atherosclerotic disease. Also, involved in wound healing. On appropriate medications.   Hypercholesterolemia lipid control important in reducing the progression of atherosclerotic disease. Continue statin therapy  Lymphedema His swelling is better but still far from resolved.  I would recommend he go back to using the lymphedema pump regularly.  We will continue him in Unna boots for several weeks as he still has some shallow ulcerations in the last time we came out of the Unna boots he quickly developed blistering and swelling.  Recommend increasing activity and elevating his legs as well.  A 3 layer Unna boot was placed bilaterally today and will be changed weekly.  I will see him in about 4 weeks.    Leotis Pain, MD  07/05/2018 9:34 AM    This note was created with Dragon medical transcription system.  Any errors from dictation are purely unintentional

## 2018-07-05 NOTE — Assessment & Plan Note (Signed)
His swelling is better but still far from resolved.  I would recommend he go back to using the lymphedema pump regularly.  We will continue him in Unna boots for several weeks as he still has some shallow ulcerations in the last time we came out of the Unna boots he quickly developed blistering and swelling.  Recommend increasing activity and elevating his legs as well.  A 3 layer Unna boot was placed bilaterally today and will be changed weekly.  I will see him in about 4 weeks.

## 2018-07-06 ENCOUNTER — Inpatient Hospital Stay (HOSPITAL_BASED_OUTPATIENT_CLINIC_OR_DEPARTMENT_OTHER): Payer: Medicare Other | Admitting: Oncology

## 2018-07-06 ENCOUNTER — Other Ambulatory Visit: Payer: Self-pay

## 2018-07-06 VITALS — BP 135/73 | HR 83 | Temp 96.4°F | Resp 18 | Wt 243.3 lb

## 2018-07-06 DIAGNOSIS — C8318 Mantle cell lymphoma, lymph nodes of multiple sites: Secondary | ICD-10-CM | POA: Diagnosis not present

## 2018-07-06 DIAGNOSIS — R001 Bradycardia, unspecified: Secondary | ICD-10-CM | POA: Diagnosis not present

## 2018-07-06 DIAGNOSIS — Z950 Presence of cardiac pacemaker: Secondary | ICD-10-CM

## 2018-07-06 DIAGNOSIS — Z79899 Other long term (current) drug therapy: Secondary | ICD-10-CM

## 2018-07-06 DIAGNOSIS — E7800 Pure hypercholesterolemia, unspecified: Secondary | ICD-10-CM | POA: Diagnosis not present

## 2018-07-06 DIAGNOSIS — I2510 Atherosclerotic heart disease of native coronary artery without angina pectoris: Secondary | ICD-10-CM

## 2018-07-06 DIAGNOSIS — R600 Localized edema: Secondary | ICD-10-CM | POA: Diagnosis not present

## 2018-07-06 DIAGNOSIS — I10 Essential (primary) hypertension: Secondary | ICD-10-CM | POA: Diagnosis not present

## 2018-07-06 DIAGNOSIS — N289 Disorder of kidney and ureter, unspecified: Secondary | ICD-10-CM

## 2018-07-06 DIAGNOSIS — Z9641 Presence of insulin pump (external) (internal): Secondary | ICD-10-CM | POA: Diagnosis not present

## 2018-07-06 DIAGNOSIS — Z807 Family history of other malignant neoplasms of lymphoid, hematopoietic and related tissues: Secondary | ICD-10-CM

## 2018-07-06 DIAGNOSIS — D61818 Other pancytopenia: Secondary | ICD-10-CM | POA: Diagnosis not present

## 2018-07-06 DIAGNOSIS — Z87891 Personal history of nicotine dependence: Secondary | ICD-10-CM

## 2018-07-06 DIAGNOSIS — E1143 Type 2 diabetes mellitus with diabetic autonomic (poly)neuropathy: Secondary | ICD-10-CM

## 2018-07-06 DIAGNOSIS — Z803 Family history of malignant neoplasm of breast: Secondary | ICD-10-CM

## 2018-07-06 NOTE — Progress Notes (Signed)
Here for follow up, per pt " im doing ok "

## 2018-07-07 DIAGNOSIS — E10649 Type 1 diabetes mellitus with hypoglycemia without coma: Secondary | ICD-10-CM | POA: Diagnosis not present

## 2018-07-07 DIAGNOSIS — I10 Essential (primary) hypertension: Secondary | ICD-10-CM | POA: Diagnosis not present

## 2018-07-07 DIAGNOSIS — E1159 Type 2 diabetes mellitus with other circulatory complications: Secondary | ICD-10-CM | POA: Diagnosis not present

## 2018-07-07 DIAGNOSIS — I2510 Atherosclerotic heart disease of native coronary artery without angina pectoris: Secondary | ICD-10-CM | POA: Diagnosis not present

## 2018-07-07 DIAGNOSIS — I5022 Chronic systolic (congestive) heart failure: Secondary | ICD-10-CM | POA: Diagnosis not present

## 2018-07-12 ENCOUNTER — Ambulatory Visit (INDEPENDENT_AMBULATORY_CARE_PROVIDER_SITE_OTHER): Payer: Medicare Other | Admitting: Nurse Practitioner

## 2018-07-12 ENCOUNTER — Encounter (INDEPENDENT_AMBULATORY_CARE_PROVIDER_SITE_OTHER): Payer: Self-pay

## 2018-07-12 VITALS — BP 119/67 | HR 80 | Resp 19 | Ht 70.0 in | Wt 247.0 lb

## 2018-07-12 DIAGNOSIS — R600 Localized edema: Secondary | ICD-10-CM | POA: Diagnosis not present

## 2018-07-12 NOTE — Progress Notes (Signed)
History of Present Illness  There is no documented history at this time  Assessments & Plan   There are no diagnoses linked to this encounter.    Additional instructions  Subjective:  Patient presents with venous ulcer of the Bilateral lower extremity.    Procedure:  3 layer unna wrap was placed Bilateral lower extremity.   Plan:   Follow up in one week.  

## 2018-07-14 ENCOUNTER — Emergency Department: Payer: Medicare Other

## 2018-07-14 ENCOUNTER — Other Ambulatory Visit: Payer: Self-pay

## 2018-07-14 ENCOUNTER — Emergency Department
Admission: EM | Admit: 2018-07-14 | Discharge: 2018-07-14 | Disposition: A | Discharge status: Home / Self Care | Payer: Medicare Other | Attending: Emergency Medicine | Admitting: Emergency Medicine

## 2018-07-14 DIAGNOSIS — Y92009 Unspecified place in unspecified non-institutional (private) residence as the place of occurrence of the external cause: Secondary | ICD-10-CM | POA: Insufficient documentation

## 2018-07-14 DIAGNOSIS — S0083XA Contusion of other part of head, initial encounter: Secondary | ICD-10-CM | POA: Insufficient documentation

## 2018-07-14 DIAGNOSIS — Y999 Unspecified external cause status: Secondary | ICD-10-CM | POA: Insufficient documentation

## 2018-07-14 DIAGNOSIS — F17220 Nicotine dependence, chewing tobacco, uncomplicated: Secondary | ICD-10-CM | POA: Insufficient documentation

## 2018-07-14 DIAGNOSIS — E11649 Type 2 diabetes mellitus with hypoglycemia without coma: Secondary | ICD-10-CM | POA: Diagnosis not present

## 2018-07-14 DIAGNOSIS — E11311 Type 2 diabetes mellitus with unspecified diabetic retinopathy with macular edema: Secondary | ICD-10-CM | POA: Insufficient documentation

## 2018-07-14 DIAGNOSIS — Z794 Long term (current) use of insulin: Secondary | ICD-10-CM | POA: Insufficient documentation

## 2018-07-14 DIAGNOSIS — I110 Hypertensive heart disease with heart failure: Secondary | ICD-10-CM | POA: Insufficient documentation

## 2018-07-14 DIAGNOSIS — S41112A Laceration without foreign body of left upper arm, initial encounter: Secondary | ICD-10-CM | POA: Diagnosis not present

## 2018-07-14 DIAGNOSIS — E162 Hypoglycemia, unspecified: Secondary | ICD-10-CM | POA: Diagnosis not present

## 2018-07-14 DIAGNOSIS — T148XXA Other injury of unspecified body region, initial encounter: Secondary | ICD-10-CM

## 2018-07-14 DIAGNOSIS — I2510 Atherosclerotic heart disease of native coronary artery without angina pectoris: Secondary | ICD-10-CM | POA: Insufficient documentation

## 2018-07-14 DIAGNOSIS — Z79899 Other long term (current) drug therapy: Secondary | ICD-10-CM | POA: Insufficient documentation

## 2018-07-14 DIAGNOSIS — S41111A Laceration without foreign body of right upper arm, initial encounter: Secondary | ICD-10-CM | POA: Diagnosis not present

## 2018-07-14 DIAGNOSIS — W1830XA Fall on same level, unspecified, initial encounter: Secondary | ICD-10-CM | POA: Insufficient documentation

## 2018-07-14 DIAGNOSIS — Z950 Presence of cardiac pacemaker: Secondary | ICD-10-CM | POA: Insufficient documentation

## 2018-07-14 DIAGNOSIS — E1140 Type 2 diabetes mellitus with diabetic neuropathy, unspecified: Secondary | ICD-10-CM | POA: Diagnosis not present

## 2018-07-14 DIAGNOSIS — I5022 Chronic systolic (congestive) heart failure: Secondary | ICD-10-CM | POA: Diagnosis not present

## 2018-07-14 DIAGNOSIS — S0990XA Unspecified injury of head, initial encounter: Secondary | ICD-10-CM | POA: Diagnosis not present

## 2018-07-14 DIAGNOSIS — Z23 Encounter for immunization: Secondary | ICD-10-CM | POA: Diagnosis not present

## 2018-07-14 DIAGNOSIS — Y939 Activity, unspecified: Secondary | ICD-10-CM | POA: Insufficient documentation

## 2018-07-14 DIAGNOSIS — W19XXXA Unspecified fall, initial encounter: Secondary | ICD-10-CM

## 2018-07-14 DIAGNOSIS — S299XXA Unspecified injury of thorax, initial encounter: Secondary | ICD-10-CM | POA: Diagnosis not present

## 2018-07-14 DIAGNOSIS — D61818 Other pancytopenia: Secondary | ICD-10-CM | POA: Insufficient documentation

## 2018-07-14 DIAGNOSIS — I10 Essential (primary) hypertension: Secondary | ICD-10-CM | POA: Diagnosis not present

## 2018-07-14 LAB — LIPASE, BLOOD: LIPASE: 23 U/L (ref 11–51)

## 2018-07-14 LAB — COMPREHENSIVE METABOLIC PANEL
ALT: 18 U/L (ref 0–44)
ANION GAP: 6 (ref 5–15)
AST: 35 U/L (ref 15–41)
Albumin: 3.2 g/dL — ABNORMAL LOW (ref 3.5–5.0)
Alkaline Phosphatase: 154 U/L — ABNORMAL HIGH (ref 38–126)
BUN: 22 mg/dL (ref 8–23)
CALCIUM: 7.8 mg/dL — AB (ref 8.9–10.3)
CO2: 21 mmol/L — ABNORMAL LOW (ref 22–32)
Chloride: 111 mmol/L (ref 98–111)
Creatinine, Ser: 1.61 mg/dL — ABNORMAL HIGH (ref 0.61–1.24)
GFR calc non Af Amer: 42 mL/min — ABNORMAL LOW (ref >60)
GFR, EST AFRICAN AMERICAN: 48 mL/min — AB (ref >60)
GLUCOSE: 383 mg/dL — AB (ref 70–99)
POTASSIUM: 4.2 mmol/L (ref 3.5–5.1)
Sodium: 138 mmol/L (ref 135–145)
Total Bilirubin: 1 mg/dL (ref 0.3–1.2)
Total Protein: 5.8 g/dL — ABNORMAL LOW (ref 6.5–8.1)

## 2018-07-14 LAB — GLUCOSE, CAPILLARY
GLUCOSE-CAPILLARY: 98 mg/dL (ref 70–99)
GLUCOSE-CAPILLARY: 424 mg/dL — AB (ref 70–99)
Glucose-Capillary: 181 mg/dL — ABNORMAL HIGH (ref 70–99)
Glucose-Capillary: 222 mg/dL — ABNORMAL HIGH (ref 70–99)

## 2018-07-14 LAB — URINALYSIS, COMPLETE (UACMP) WITH MICROSCOPIC
BACTERIA UA: NONE SEEN
Bilirubin Urine: NEGATIVE
GLUCOSE, UA: NEGATIVE mg/dL
Hgb urine dipstick: NEGATIVE
KETONES UR: 5 mg/dL — AB
LEUKOCYTES UA: NEGATIVE
Nitrite: NEGATIVE
PH: 5 (ref 5.0–8.0)
Protein, ur: 30 mg/dL — AB
SPECIFIC GRAVITY, URINE: 1.013 (ref 1.005–1.030)

## 2018-07-14 LAB — CBC WITH DIFFERENTIAL/PLATELET
BASOS ABS: 0 10*3/uL (ref 0–0.1)
BASOS PCT: 2 %
EOS ABS: 0.1 10*3/uL (ref 0–0.7)
Eosinophils Relative: 4 %
HCT: 31.9 % — ABNORMAL LOW (ref 40.0–52.0)
HEMOGLOBIN: 11.3 g/dL — AB (ref 13.0–18.0)
LYMPHS PCT: 16 %
Lymphs Abs: 0.3 10*3/uL — ABNORMAL LOW (ref 1.0–3.6)
MCH: 33 pg (ref 26.0–34.0)
MCHC: 35.4 g/dL (ref 32.0–36.0)
MCV: 93.2 fL (ref 80.0–100.0)
Monocytes Absolute: 0.3 10*3/uL (ref 0.2–1.0)
Monocytes Relative: 16 %
NEUTROS PCT: 62 %
Neutro Abs: 0.9 10*3/uL — ABNORMAL LOW (ref 1.4–6.5)
PLATELETS: 128 10*3/uL — AB (ref 150–440)
RBC: 3.42 MIL/uL — AB (ref 4.40–5.90)
RDW: 14.8 % — AB (ref 11.5–14.5)
WBC: 1.6 10*3/uL — ABNORMAL LOW (ref 3.8–10.6)

## 2018-07-14 LAB — PROTIME-INR
INR: 1.12
PROTHROMBIN TIME: 14.3 s (ref 11.4–15.2)

## 2018-07-14 LAB — LACTIC ACID, PLASMA: LACTIC ACID, VENOUS: 0.9 mmol/L (ref 0.5–1.9)

## 2018-07-14 LAB — TROPONIN I: Troponin I: <0.03 ng/mL (ref <0.03)

## 2018-07-14 MED ORDER — SODIUM CHLORIDE 0.9 % IV BOLUS
1000.0000 mL | Freq: Once | INTRAVENOUS | Status: AC
  Administered 2018-07-14: 1000 mL via INTRAVENOUS

## 2018-07-14 MED ORDER — TETANUS-DIPHTH-ACELL PERTUSSIS 5-2.5-18.5 LF-MCG/0.5 IM SUSP
0.5000 mL | Freq: Once | INTRAMUSCULAR | Status: AC
  Administered 2018-07-14: 0.5 mL via INTRAMUSCULAR
  Filled 2018-07-14: qty 0.5

## 2018-07-14 NOTE — ED Notes (Signed)
Pt independently leaves room and found in lobby. First nurse and this nurse walk next to pt back into room and explain we will stay in the room until a safe ride home arrives

## 2018-07-14 NOTE — ED Notes (Signed)
Pt's wife called with no answer

## 2018-07-14 NOTE — Discharge Instructions (Signed)
Your tetanus has been updated and will be good for the next 10 years. Continue your medicines as directed by your doctor. Return to the ER for worsening symptoms, persistent vomiting, difficulty breathing or other concerns.

## 2018-07-14 NOTE — ED Triage Notes (Signed)
Pt found on the floor at home .glucose =58 at seen . Per ems pt was awake ,a/ox3  Glucose at this time = 98 after 1 tube oral glucose and 250cc of D10 . Pt observed with skin tares to both arms and hands as well as bruise to forehead

## 2018-07-14 NOTE — ED Notes (Signed)
Pt at doorway stating he wants to walk home. Pt assisted back to bed and stated it was not a safe plan to walk home. Pt remains restless/

## 2018-07-14 NOTE — ED Notes (Signed)
Pt tearful stating "it's not right for yall to have to pay for a cab-ill just walk. " Pt explained that was not safe and we would provide a cab.  Pt verifies address [address]

## 2018-07-14 NOTE — ED Provider Notes (Signed)
[facility] Emergency Department Provider Note   ____________________________________________   First MD Initiated Contact with Patient 07/14/18 [phone]     (approximate)  I have reviewed the triage vital signs and the nursing notes.   HISTORY  Chief Complaint Fall and Hypoglycemia    HPI ABRAHM MANCIA is a 72 y.o. male brought to the ED from home with a chief complaint of fall.  Patient was reportedly found on the floor at home.  Initial FS BS 58.  Patient is an insulin-dependent diabetic who wears an insulin pump.  States this happens "all the time".  EMS gave oral glucose and 250 cc of D10.  Last checked by EMS FS BS over 400.  Patient with bruising to his forehead and skin tears over bilateral arms.  Both legs are wrapped; sees vascular surgery.  Denies recent fever, chills, chest pain, shortness of breath, abdominal pain, nausea or vomiting.  Tetanus is not up-to-date.  EMS reports hoarding type living situation.   Past Medical History:  Diagnosis Date  . Depression   . 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 Gastroparesis   . Graves disease   . Hypercholesterolemia   . Hypertension   . Mantle cell lymphoma ([place]) 07/2014  . Peripheral neuropathy     Patient Active Problem List   Diagnosis Date Noted  . Chronic cholecystitis 12/29/2017  . Graves disease 12/29/2017  . Peripheral neuropathy 12/29/2017  . S/p nephrectomy 12/29/2017  . 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 Bradycardia 12/02/2017  . AKI (acute kidney injury) ([place]) 12/02/2017  . Chronic systolic CHF (congestive heart failure) ([place]) 12/02/2017  . Saturday night paralysis 11/12/2017  . Bilateral leg edema 11/11/2017  . Hypoglycemia 10/28/2017  . Goals of care, counseling/discussion 10/24/2017  . Lymphedema 10/20/2017  . Iron deficiency anemia 09/11/2017  . Fall 08/13/2017  . Humerus fracture 08/13/2017  . Anemia 01/12/2017    . Bilateral carotid artery stenosis 06/29/2016  . Hand laceration 12/16/2015  . Adjustment disorder with depressed mood 08/11/2015  . Cardiomyopathy, idiopathic ([place]) 04/22/2015  . CAD in native artery 11/02/2014  . Mantle cell lymphoma ([place]) 08/03/2014  . History of colonic polyps 04/29/2014  . Irregular heart beat 04/22/2014  . SOB (shortness of breath) 04/22/2014  . Stress 03/21/2014  . PVC (premature ventricular contraction) 02/21/2014  . GERD (gastroesophageal reflux disease) 10/23/2013  . Gastroparesis 06/25/2013  . Chest pain 04/13/2013  . Thyroid disease 04/13/2013  . Diabetes ([place]) 04/13/2013  . Essential hypertension, benign 04/13/2013  . Hypercholesterolemia 04/13/2013    Past Surgical History:  Procedure Laterality Date  . CHOLECYSTECTOMY    . NEPHRECTOMY     right  . PACEMAKER INSERTION N/A 12/07/2017   Procedure: INSERTION PACEMAKER DUAL CHAMBER INITIAL INSERT;  Surgeon: Isaias Cowman, MD;  Location: [facility] ORS;  Service: Cardiovascular;  Laterality: N/A;  . PORTA CATH INSERTION N/A 11/03/2017   Procedure: PORTA CATH INSERTION;  Surgeon: Katha Cabal, MD;  Location: [facility];  Service: Cardiovascular;  Laterality: N/A;    Prior to Admission medications   Medication Sig Start Date End Date Taking? Authorizing Provider  ACCU-CHEK AVIVA PLUS test strip USE AS INSTRUCTED. USE 1 STRIP EVERY 3 HOURS . ACCU-CHECK AVIVA PLUS TEST STRIP. DX H85.277 01/06/17   [provider]  acyclovir (ZOVIRAX) 400 MG tablet TAKE 1 TABLET BY MOUTH EVERY DAY 03/26/18   Lloyd Huger, MD  carvedilol (COREG) 3.125 MG tablet  Take 3.125 mg by mouth 2 (two) times daily with a meal.  10/07/17 10/07/18  [provider]  cyanocobalamin (,VITAMIN B-12,) 1000 MCG/ML injection Inject into the muscle.    [provider]  diphenhydrAMINE-zinc acetate (BENADRYL EXTRA STRENGTH) cream Apply 1 application topically daily as needed for itching (Place on  legs before Unna boot placement).    [provider]  ferrous sulfate 325 (65 FE) MG tablet Take 325 mg by mouth daily.    [provider]  furosemide (LASIX) 20 MG tablet Take 1 tablet (20 mg total) by mouth daily. 08/10/17 08/10/18  [name], MD  GLUCAGON EMERGENCY 1 MG injection  11/30/17   [provider]  glucose 4 GM chewable tablet Chew 1 tablet by mouth once as needed for low blood sugar.    [provider]  glucose blood (PRECISION QID TEST) test strip Use 8 (eight) times daily as directed. BAYER CONTOUR NEXT TEST STRIPS E10.649 03/03/18   [provider]  insulin aspart (NOVOLOG) 100 UNIT/ML injection Basal rate 12am 0.8 units per hour, 9am 0.9 units per hour. (total basal insulin 20.7 units). Carbohydrate ratio 1 units for every 8gm of carbohydrate. Correction factor 1 units for every 50mg /dl over target cbg. Target CBG 80-120 10/29/17   Loletha Grayer, MD  levothyroxine (SYNTHROID, LEVOTHROID) 25 MCG tablet Take 25 mcg by mouth daily.    [provider]  lidocaine-prilocaine (EMLA) cream Apply 1 application topically as needed. Apply to port prior to chemotherapy appointment, cover with plastic wrap. 11/16/17   Lloyd Huger, MD  lisinopril (PRINIVIL,ZESTRIL) 20 MG tablet Take 20 mg by mouth daily.    [provider]  Multiple Vitamin (MULTIVITAMIN) tablet Take 1 tablet by mouth daily.    [provider]  naproxen sodium (ALEVE) 220 MG tablet Take 220 mg by mouth daily as needed.    [provider]  pravastatin (PRAVACHOL) 40 MG tablet Take 1 tablet (40 mg total) by mouth at bedtime. Will need office visit for any more refills 04/15/17   [name], MD  prochlorperazine (COMPAZINE) 10 MG tablet TAKE 1 TABLET (10 MG TOTAL) BY MOUTH EVERY 6 (SIX) HOURS AS NEEDED (NAUSEA OR VOMITING). 10/24/17   [provider]  spironolactone (ALDACTONE) 25 MG tablet Take 25 mg by mouth daily.    [provider]    Allergies Rofecoxib  Family History  Problem Relation Age of Onset  . Breast cancer Mother   . Diabetes Father   . Cancer Father        Lymphoma  . Alcohol abuse Maternal Uncle   . Diabetes Sister   . Heart disease Unknown        grandfather    Social History Social History   Tobacco Use  . Smoking status: Former Research scientist (life sciences)  . Smokeless tobacco: Current User    Types: Chew  Substance Use Topics  . Alcohol use: Yes    Alcohol/week: 3.0 standard drinks    Types: 3 Cans of beer per week    Comment: nightly  . Drug use: No    Review of Systems  Constitutional: Positive for fall.  No fever/chills Eyes: No visual changes. ENT: No sore throat. Cardiovascular: Denies chest pain. Respiratory: Denies shortness of breath. Gastrointestinal: No abdominal pain.  No nausea, no vomiting.  No diarrhea.  No constipation. Genitourinary: Negative for dysuria. Musculoskeletal: Negative for back pain. Skin: Negative for rash. Neurological: Negative for headaches, focal weakness or numbness. Endocrine: Positive for  hypoglycemia.   ____________________________________________   PHYSICAL EXAM:  VITAL SIGNS: ED Triage Vitals  Enc Vitals Group     BP 07/14/18 0324 115/72     Pulse Rate 07/14/18 0324 92     Resp 07/14/18 0324 18     Temp 07/14/18 0324 97.6 F (36.4 C)     Temp Source 07/14/18 0324 Oral     SpO2 07/14/18 0324 99 %     Weight 07/14/18 0326 246 lb 14.6 oz (112 kg)     Height 07/14/18 0326 5\' 10"  (1.778 m)     Head Circumference --      Peak Flow --      Pain Score 07/14/18 0326 2     Pain Loc --      Pain Edu? --      Excl. in [place]? --     Constitutional: Alert and oriented.  Disheveled appearing and in no acute distress. Eyes: Conjunctivae are normal. PERRL. EOMI. Head: Left forehead bruising. Nose: Atraumatic. Mouth/Throat: Mucous membranes are moist.  Oropharynx non-erythematous. Neck: No stridor.  No cervical spine tenderness to  palpation. Cardiovascular: Normal rate, regular rhythm. Grossly normal heart sounds.  Good peripheral circulation. Respiratory: Normal respiratory effort.  No retractions. Lungs CTAB. Gastrointestinal: Soft and nontender. No distention. No abdominal bruits. No CVA tenderness. Musculoskeletal: Skin tears noted to BUE.  BLE with compression wraps. Neurologic:  Normal speech and language. No gross focal neurologic deficits are appreciated.  Skin:  Skin is warm, dry and intact. No rash noted. Psychiatric: Mood and affect are normal. Speech and behavior are normal.  ____________________________________________   LABS (all labs ordered are listed, but only abnormal results are displayed)  Labs Reviewed  CBC WITH DIFFERENTIAL/PLATELET - Abnormal; Notable for the following components:      Result Value   WBC 1.6 (*)    RBC 3.42 (*)    Hemoglobin 11.3 (*)    HCT 31.9 (*)    RDW 14.8 (*)    Platelets 128 (*)    Neutro Abs 0.9 (*)    Lymphs Abs 0.3 (*)    All other components within normal limits  COMPREHENSIVE METABOLIC PANEL - Abnormal; Notable for the following components:   CO2 21 (*)    Glucose, Bld 383 (*)    Creatinine, Ser 1.61 (*)    Calcium 7.8 (*)    Total Protein 5.8 (*)    Albumin 3.2 (*)    Alkaline Phosphatase 154 (*)    GFR calc non Af Amer 42 (*)    GFR calc Af Amer 48 (*)    All other components within normal limits  URINALYSIS, COMPLETE (UACMP) WITH MICROSCOPIC - Abnormal; Notable for the following components:   Color, Urine YELLOW (*)    APPearance CLEAR (*)    Ketones, ur 5 (*)    Protein, ur 30 (*)    All other components within normal limits  GLUCOSE, CAPILLARY - Abnormal; Notable for the following components:   Glucose-Capillary 424 (*)    All other components within normal limits  GLUCOSE, CAPILLARY - Abnormal; Notable for the following components:   Glucose-Capillary 181 (*)    All other components within normal limits  GLUCOSE, CAPILLARY - Abnormal;  Notable for the following components:   Glucose-Capillary 222 (*)    All other components within normal limits  GLUCOSE, CAPILLARY  TROPONIN I  LACTIC ACID, PLASMA  PROTIME-INR  LIPASE, BLOOD  LACTIC ACID, PLASMA  CBG MONITORING, ED  CBG MONITORING, ED  CBG MONITORING, ED  CBG MONITORING, ED  CBG MONITORING, ED  CBG MONITORING, ED  CBG MONITORING, ED  CBG MONITORING, ED  CBG MONITORING, ED  CBG MONITORING, ED  CBG MONITORING, ED  CBG MONITORING, ED  CBG MONITORING, ED  CBG MONITORING, ED  CBG MONITORING, ED  CBG MONITORING, ED  CBG MONITORING, ED  CBG MONITORING, ED  CBG MONITORING, ED  CBG MONITORING, ED   ____________________________________________  EKG  ED ECG REPORT I, Bailee Thall J, the attending physician, personally viewed and interpreted this ECG.   Date: 07/14/2018  EKG Time: 0505  Rate: 90  Rhythm: Paced rhythm  Axis: Paced  Intervals:none  ST&T Change: Paced  ____________________________________________  RADIOLOGY  ED MD interpretation: No ICH; no acute cardiopulmonary process  Official radiology report(s): Ct Head Wo Contrast  Result Date: 07/14/2018 CLINICAL DATA:  Fall on Coumadin.  Bruising to forehead. EXAM: CT HEAD WITHOUT CONTRAST TECHNIQUE: Contiguous axial images were obtained from the base of the skull through the vertex without intravenous contrast. COMPARISON:  Head CT 10/28/2017 FINDINGS: Brain: Unchanged degree of atrophy and chronic small vessel ischemia. Remote lacunar infarct in right basal ganglia. No intracranial hemorrhage, mass effect, or midline shift. No hydrocephalus. The basilar cisterns are patent. No evidence of territorial infarct or acute ischemia. No extra-axial or intracranial fluid collection. Vascular: Atherosclerosis of skullbase vasculature without hyperdense vessel or abnormal calcification. Skull: No fracture or focal lesion. Sinuses/Orbits: Minimal mucosal thickening of ethmoid air cells. No sinus fluid level.  Bilateral cataract resection. Other: None. IMPRESSION: 1.  No acute intracranial abnormality.  No skull fracture. 2. Unchanged atrophy and chronic small vessel ischemia. Electronically Signed   By: Keith Rake M.D.   On: 07/14/2018 04:57   Dg Chest Port 1 View  Result Date: 07/14/2018 CLINICAL DATA:  Fall, weakness. EXAM: PORTABLE CHEST 1 VIEW COMPARISON:  CT 07/04/2018, most recent radiograph 12/07/2017 FINDINGS: Right chest port with tip in the mid SVC. Left-sided pacemaker in place. Chronic cardiomegaly appears similar to prior exams. No pulmonary edema, focal airspace disease, large pleural effusion or pneumothorax. Subsegmental atelectasis or scarring in the left lung. 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 IMPRESSION: Chronic cardiomegaly. Subsegmental atelectasis or scarring in the left lung. Electronically Signed   By: Keith Rake M.D.   On: 07/14/2018 04:22    ____________________________________________   PROCEDURES  Procedure(s) performed: None  Procedures  Critical Care performed: No  ____________________________________________   INITIAL IMPRESSION / ASSESSMENT AND PLAN / ED COURSE  As part of my medical decision making, I reviewed the following data within the [place] notes reviewed and incorporated, Labs reviewed, EKG interpreted, Old chart reviewed, Radiograph reviewed, Discussed with admitting physician and Notes from prior ED visits   72 year old male on insulin pump for IDDM who presents with hypoglycemia and fall.  Differential diagnosis includes but is not limited to accidental overdose, metabolic, electrolyte, infectious etiologies, etc.  Had patient stop his insulin pump so we can more accurately measure his blood sugars here.  Will obtain screening lab work, CT head, chest x-ray, urinalysis.  Will reassess.  Clinical Course as of Jul 14 704  Thu Jul 14, 2018  0701 Patient has  maintained his blood sugar 1 hour after resuming his insulin pump.  He overall feels fine and is eager for discharge home.  Strict return precautions given.  Patient verbalizes understanding and agrees with plan of care.   [JS]  0705 Of note, patient has  mantle cell lymphoma.  Pancytopenia is stable.   [JS]    Clinical Course User Index [JS] Paulette Blanch, MD     ____________________________________________   FINAL CLINICAL IMPRESSION(S) / ED DIAGNOSES  Final diagnoses:  Fall, initial encounter  Hypoglycemia  Pancytopenia [facility])     ED Discharge Orders    None       Note:  This document was prepared using Dragon voice recognition software and may include unintentional dictation errors.    Paulette Blanch, MD 07/14/18 [phone]

## 2018-07-14 NOTE — ED Notes (Addendum)
Pt uses a cane at baseline but walks with a steady gait. Pt states he drives but his car and phone are at home. Pt's wife called with no answer. Pt states he has no money for cab. Pt states he drives himself or medical bus will pick him up for his normal trips. Pt states he walks independent with the use of a cane

## 2018-07-14 NOTE — ED Notes (Signed)
PT in NAD, waiting in bed on cab .

## 2018-07-14 NOTE — ED Notes (Signed)
[employer] called for transportation, states it would be a minimal of an hour until they could transport.

## 2018-07-14 NOTE — ED Notes (Signed)
Safe plan made by this RN and charge to provide cab. Pt agrees.

## 2018-07-14 NOTE — ED Notes (Signed)
Pt offered food. Pt states his wife has his teeth. Pt asked if he wanted apple sauce or other soft food he could eat without teeth. Pt states no

## 2018-07-19 ENCOUNTER — Ambulatory Visit (INDEPENDENT_AMBULATORY_CARE_PROVIDER_SITE_OTHER): Payer: Medicare Other | Admitting: Nurse Practitioner

## 2018-07-19 ENCOUNTER — Encounter (INDEPENDENT_AMBULATORY_CARE_PROVIDER_SITE_OTHER): Payer: Self-pay

## 2018-07-19 VITALS — BP 110/62 | HR 77 | Resp 19 | Ht 70.0 in | Wt 249.0 lb

## 2018-07-19 DIAGNOSIS — I890 Lymphedema, not elsewhere classified: Secondary | ICD-10-CM | POA: Diagnosis not present

## 2018-07-19 DIAGNOSIS — L97919 Non-pressure chronic ulcer of unspecified part of right lower leg with unspecified severity: Secondary | ICD-10-CM | POA: Diagnosis not present

## 2018-07-19 DIAGNOSIS — L97929 Non-pressure chronic ulcer of unspecified part of left lower leg with unspecified severity: Secondary | ICD-10-CM

## 2018-07-19 DIAGNOSIS — R001 Bradycardia, unspecified: Secondary | ICD-10-CM | POA: Diagnosis not present

## 2018-07-19 NOTE — Progress Notes (Signed)
History of Present Illness  There is no documented history at this time  Assessments & Plan   There are no diagnoses linked to this encounter.    Additional instructions  Subjective:  Patient presents with venous ulcer of the Bilateral lower extremity.    Procedure:  3 layer unna wrap was placed Bilateral lower extremity.   Plan:   Follow up in one week.  

## 2018-07-22 DIAGNOSIS — I5022 Chronic systolic (congestive) heart failure: Secondary | ICD-10-CM | POA: Diagnosis not present

## 2018-07-24 ENCOUNTER — Other Ambulatory Visit: Payer: Self-pay | Admitting: Oncology

## 2018-07-24 DIAGNOSIS — C8318 Mantle cell lymphoma, lymph nodes of multiple sites: Secondary | ICD-10-CM

## 2018-07-26 ENCOUNTER — Encounter (INDEPENDENT_AMBULATORY_CARE_PROVIDER_SITE_OTHER): Payer: Self-pay

## 2018-07-26 ENCOUNTER — Ambulatory Visit (INDEPENDENT_AMBULATORY_CARE_PROVIDER_SITE_OTHER): Payer: Medicare Other | Admitting: Vascular Surgery

## 2018-07-26 VITALS — BP 123/64 | HR 91 | Resp 20 | Ht 70.0 in | Wt 253.0 lb

## 2018-07-26 DIAGNOSIS — I2510 Atherosclerotic heart disease of native coronary artery without angina pectoris: Secondary | ICD-10-CM

## 2018-07-26 DIAGNOSIS — R600 Localized edema: Secondary | ICD-10-CM

## 2018-07-26 NOTE — Progress Notes (Signed)
History of Present Illness  Venous ulcerations and leg swelling bilaterally  Assessments & Plan   There are no diagnoses linked to this encounter.    Additional instructions  Subjective:  Patient presents with venous ulcer of the Bilateral lower extremity.    Procedure:  3 layer unna wrap was placed Bilateral lower extremity.   Plan:   Follow up in one week.

## 2018-07-27 DIAGNOSIS — E785 Hyperlipidemia, unspecified: Secondary | ICD-10-CM | POA: Diagnosis not present

## 2018-07-27 DIAGNOSIS — I10 Essential (primary) hypertension: Secondary | ICD-10-CM | POA: Diagnosis not present

## 2018-07-27 DIAGNOSIS — E1069 Type 1 diabetes mellitus with other specified complication: Secondary | ICD-10-CM | POA: Diagnosis not present

## 2018-07-27 DIAGNOSIS — I2510 Atherosclerotic heart disease of native coronary artery without angina pectoris: Secondary | ICD-10-CM | POA: Diagnosis not present

## 2018-07-27 DIAGNOSIS — R55 Syncope and collapse: Secondary | ICD-10-CM | POA: Diagnosis not present

## 2018-07-27 DIAGNOSIS — E1159 Type 2 diabetes mellitus with other circulatory complications: Secondary | ICD-10-CM | POA: Diagnosis not present

## 2018-07-27 DIAGNOSIS — I5022 Chronic systolic (congestive) heart failure: Secondary | ICD-10-CM | POA: Diagnosis not present

## 2018-08-02 ENCOUNTER — Encounter (INDEPENDENT_AMBULATORY_CARE_PROVIDER_SITE_OTHER): Payer: Self-pay | Admitting: Nurse Practitioner

## 2018-08-02 ENCOUNTER — Ambulatory Visit (INDEPENDENT_AMBULATORY_CARE_PROVIDER_SITE_OTHER): Payer: Medicare Other | Admitting: Nurse Practitioner

## 2018-08-02 VITALS — BP 123/73 | HR 65 | Resp 21 | Ht 70.0 in | Wt 258.0 lb

## 2018-08-02 DIAGNOSIS — K219 Gastro-esophageal reflux disease without esophagitis: Secondary | ICD-10-CM

## 2018-08-02 DIAGNOSIS — F17220 Nicotine dependence, chewing tobacco, uncomplicated: Secondary | ICD-10-CM | POA: Diagnosis not present

## 2018-08-02 DIAGNOSIS — I890 Lymphedema, not elsewhere classified: Secondary | ICD-10-CM

## 2018-08-02 DIAGNOSIS — I10 Essential (primary) hypertension: Secondary | ICD-10-CM | POA: Diagnosis not present

## 2018-08-02 NOTE — Progress Notes (Signed)
Subjective:    [name], male    DOB: 08/26/1946, 72 y.o.   MRN: [id number] Chief Complaint  Patient presents with  . Follow-up    4 week Unna boot check    HPI  Gary [name] is a 72 y.o. male that returns to the office for followup evaluation regarding leg swelling.  The swelling has persisted and the pain associated with swelling continues. There have not been any interval development of a ulcerations or wounds.  Since the previous visit the patient has been wearing unna wraps stockings and has noted little if any improvement in the lymphedema. The patient has been using unna wraps routinely morning until night.  The patient also states elevation during the day and exercise is being done too.  Patient denies any fever, nausea, vomiting, diarrhea.  He denies any claudication like symptoms.   Past Medical History:  Diagnosis Date  . Depression   . 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 Gastroparesis   . Graves disease   . Hypercholesterolemia   . Hypertension   . Mantle cell lymphoma ([place]) 07/2014  . Peripheral neuropathy     Past Surgical History:  Procedure Laterality Date  . CHOLECYSTECTOMY    . NEPHRECTOMY     right  . PACEMAKER INSERTION N/A 12/07/2017   Procedure: INSERTION PACEMAKER DUAL CHAMBER INITIAL INSERT;  Surgeon: Isaias Cowman, MD;  Location: [facility] ORS;  Service: Cardiovascular;  Laterality: N/A;  . PORTA CATH INSERTION N/A 11/03/2017   Procedure: PORTA CATH INSERTION;  Surgeon: Katha Cabal, MD;  Location: [facility];  Service: Cardiovascular;  Laterality: N/A;    Social History   Socioeconomic History  . Marital status: Married    Spouse name: Not on file  . Number of children: 1  . Years of education: Not on file  . Highest education level: Not on file  Occupational History  . Not on file  Social Needs  . Financial resource strain: Not on file  . Food  insecurity:    Worry: Not on file    Inability: Not on file  . Transportation needs:    Medical: Not on file    Non-medical: Not on file  Tobacco Use  . Smoking status: Former Research scientist (life sciences)  . Smokeless tobacco: Current User    Types: Chew  Substance and Sexual Activity  . Alcohol use: Yes    Alcohol/week: 3.0 standard drinks    Types: 3 Cans of beer per week    Comment: nightly  . Drug use: No  . Sexual activity: Not Currently  Lifestyle  . Physical activity:    Days per week: Not on file    Minutes per session: Not on file  . Stress: Not on file  Relationships  . Social connections:    Talks on phone: Not on file    Gets together: Not on file    Attends religious service: Not on file    Active member of club or organization: Not on file    Attends meetings of clubs or organizations: Not on file    Relationship status: Not on file  . Intimate partner violence:    Fear of current or ex partner: Not on file    Emotionally abused: Not on file    Physically abused: Not on file    Forced sexual activity: Not on file  Other Topics Concern  . Not on file  Social History Narrative  . Not on file    Family History  Problem Relation Age of Onset  . Breast cancer Mother   . Diabetes Father   . Cancer Father        Lymphoma  . Alcohol abuse Maternal Uncle   . Diabetes Sister   . Heart disease Unknown        grandfather    Allergies  Allergen Reactions  . Rofecoxib Nausea Only     Review of Systems   Review of Systems: Negative Unless Checked Constitutional: [] Weight loss  [] Fever  [] Chills Cardiac: [] Chest pain   []  Atrial Fibrillation  [] Palpitations   [] Shortness of breath when laying flat   [] Shortness of breath with exertion. Vascular:  [] Pain in legs with walking   [] Pain in legs with standing  [] History of DVT   [] Phlebitis   [x] Swelling in legs   [] Varicose veins   [] Non-healing ulcers Pulmonary:   [] Uses home oxygen   [] Productive cough   [] Hemoptysis   [] Wheeze   [] COPD   [] Asthma Neurologic:  [] Dizziness   [] Seizures   [] History of stroke   [] History of TIA  [] Aphasia   [] Vissual changes   [] Weakness or numbness in arm   [x] Weakness or numbness in leg Musculoskeletal:   [] Joint swelling   [] Joint pain   [] Low back pain  []  History of Knee Replacement Hematologic:  [] Easy bruising  [] Easy bleeding   [] Hypercoagulable state   [] Anemic Gastrointestinal:  [] Diarrhea   [] Vomiting  [] Gastroesophageal reflux/heartburn   [] Difficulty swallowing. Genitourinary:  [] Chronic kidney disease   [] Difficult urination  [] Anuric   [] Blood in urine Skin:  [] Rashes   [] Ulcers  Psychological:  [] History of anxiety   []  History of major depression  []  Memory Difficulties     Objective:   Physical Exam  BP 123/73 (BP Location: Right Arm, Patient Position: Sitting)   Pulse 65   Resp (!) 21   Ht 5\' 10"  (1.778 m)   Wt 258 lb (117 kg)   BMI 37.02 kg/m   Gen: WD/WN, NAD Head: [place]/AT, No temporalis wasting.  Ear/Nose/Throat: Hearing grossly intact, nares w/o erythema or drainage Eyes: PER, EOMI, sclera nonicteric.  Neck: Supple, no masses.  No JVD.  Pulmonary:  Good air movement, no use of accessory muscles.  Cardiac: RRR Vascular: 2+ edema  Vessel Right Left  Radial Palpable Palpable   Gastrointestinal: soft, non-distended. No guarding/no peritoneal signs.  Musculoskeletal: Using cane.  No deformity or atrophy.  Neurologic: Pain and light touch intact in extremities.  Symmetrical.  Speech is fluent. Motor exam as listed above. Psychiatric: Judgment intact, Mood & affect appropriate for pt's clinical situation. Dermatologic: No Venous rashes. No Ulcers Noted.  No changes consistent with cellulitis. Lymph : No Cervical lymphadenopathy, no lichenification or skin changes of chronic lymphedema.      Assessment & Plan:   1. Lymphedema No surgery or intervention at this point in time.    I have had a long discussion with the patient regarding venous  insufficiency and why it  causes symptoms, specifically venous ulceration . I have discussed with the patient the chronic skin changes that accompany venous insufficiency and the long term sequela such as infection and recurring  ulceration.  Patient will be placed in [employer] which will be changed weekly drainage permitting.  In addition, behavioral modification including several periods of elevation of the lower extremities during the day will be continued. Achieving a position with the ankles at heart  level was stressed to the patient  The patient is instructed to begin routine exercise, especially walking on a daily basis  Patient should undergo duplex ultrasound of the venous system to ensure that DVT or reflux is not present.  Following the review of the ultrasound the patient will follow up in four weeks to reassess the degree of swelling and the control that Unna therapy is offering.   The patient can be assessed for graduated compression stockings or wraps as well as a Lymph Pump once the ulcers are healed.   2. Gastroesophageal reflux disease, esophagitis presence not specified Continue PPI as already ordered, this medication has been reviewed and there are no changes at this time.  Avoidence of caffeine and alcohol  Moderate elevation of the head of the bed   3. Essential hypertension, benign Continue antihypertensive medications as already ordered, these medications have been reviewed and there are no changes at this time.    Current Outpatient Medications on File Prior to Visit  Medication Sig Dispense Refill  . ACCU-CHEK AVIVA PLUS test strip USE AS INSTRUCTED. USE 1 STRIP EVERY 3 HOURS . ACCU-CHECK AVIVA PLUS TEST STRIP. DX E10.649  12  . acyclovir (ZOVIRAX) 400 MG tablet TAKE 1 TABLET BY MOUTH EVERY DAY 30 tablet 3  . carvedilol (COREG) 3.125 MG tablet Take 3.125 mg by mouth 2 (two) times daily with a meal.     . cyanocobalamin (,VITAMIN B-12,) 1000 MCG/ML injection  Inject into the muscle.    . diphenhydrAMINE-zinc acetate (BENADRYL EXTRA STRENGTH) cream Apply 1 application topically daily as needed for itching (Place on legs before Unna boot placement).    . ferrous sulfate 325 (65 FE) MG tablet Take 325 mg by mouth daily.    . furosemide (LASIX) 20 MG tablet Take 1 tablet (20 mg total) by mouth daily. 30 tablet 1  . GLUCAGON EMERGENCY 1 MG injection     . glucose 4 GM chewable tablet Chew 1 tablet by mouth once as needed for low blood sugar.    Marland Kitchen glucose blood (PRECISION QID TEST) test strip Use 8 (eight) times daily as directed. BAYER CONTOUR NEXT TEST STRIPS E10.649    . insulin aspart (NOVOLOG) 100 UNIT/ML injection Basal rate 12am 0.8 units per hour, 9am 0.9 units per hour. (total basal insulin 20.7 units). Carbohydrate ratio 1 units for every 8gm of carbohydrate. Correction factor 1 units for every 50mg /dl over target cbg. Target CBG 80-120 1 vial 12  . levothyroxine (SYNTHROID, LEVOTHROID) 25 MCG tablet Take 25 mcg by mouth daily.    Marland Kitchen lidocaine-prilocaine (EMLA) cream Apply 1 application topically as needed. Apply to port prior to chemotherapy appointment, cover with plastic wrap. 30 g 2  . lisinopril (PRINIVIL,ZESTRIL) 20 MG tablet Take 20 mg by mouth daily.    . Multiple Vitamin (MULTIVITAMIN) tablet Take 1 tablet by mouth daily.    . naproxen sodium (ALEVE) 220 MG tablet Take 220 mg by mouth daily as needed.    . pravastatin (PRAVACHOL) 40 MG tablet Take 1 tablet (40 mg total) by mouth at bedtime. Will need office visit for any more refills 30 tablet 0  . prochlorperazine (COMPAZINE) 10 MG tablet TAKE 1 TABLET (10 MG TOTAL) BY MOUTH EVERY 6 (SIX) HOURS AS NEEDED (NAUSEA OR VOMITING).    Marland Kitchen spironolactone (ALDACTONE) 25 MG tablet Take 25 mg by mouth daily.     Current Facility-Administered Medications on File Prior to Visit  Medication Dose Route Frequency Provider Last Rate Last  Dose  . heparin lock flush 100 unit/mL  500 Units Intravenous Once  Lloyd Huger, MD      . sodium chloride flush (NS) 0.9 % injection 10 mL  10 mL Intravenous PRN Lloyd Huger, MD   10 mL at 02/01/18 0829  . sodium chloride flush (NS) 0.9 % injection 10 mL  10 mL Intravenous PRN Lloyd Huger, MD   10 mL at 04/05/18 0800    There are no Patient Instructions on file for this visit. Return in about 4 weeks (around 08/30/2018).   Kris Hartmann, NP  This note was completed with Sales executive.  Any errors are purely unintentional.

## 2018-08-09 ENCOUNTER — Ambulatory Visit (INDEPENDENT_AMBULATORY_CARE_PROVIDER_SITE_OTHER): Payer: Medicare Other | Admitting: Nurse Practitioner

## 2018-08-09 ENCOUNTER — Other Ambulatory Visit: Payer: Self-pay | Admitting: Oncology

## 2018-08-09 ENCOUNTER — Encounter (INDEPENDENT_AMBULATORY_CARE_PROVIDER_SITE_OTHER): Payer: Self-pay | Admitting: Nurse Practitioner

## 2018-08-09 VITALS — BP 142/74 | HR 63 | Resp 20 | Ht 70.0 in | Wt 255.0 lb

## 2018-08-09 DIAGNOSIS — I890 Lymphedema, not elsewhere classified: Secondary | ICD-10-CM | POA: Diagnosis not present

## 2018-08-09 DIAGNOSIS — L97929 Non-pressure chronic ulcer of unspecified part of left lower leg with unspecified severity: Secondary | ICD-10-CM | POA: Diagnosis not present

## 2018-08-09 DIAGNOSIS — L97919 Non-pressure chronic ulcer of unspecified part of right lower leg with unspecified severity: Secondary | ICD-10-CM | POA: Diagnosis not present

## 2018-08-09 DIAGNOSIS — C8318 Mantle cell lymphoma, lymph nodes of multiple sites: Secondary | ICD-10-CM

## 2018-08-09 NOTE — Progress Notes (Signed)
History of Present Illness  There is no documented history at this time  Assessments & Plan   There are no diagnoses linked to this encounter.    Additional instructions  Subjective:  Patient presents with venous ulcer of the Bilateral lower extremity.    Procedure:  3 layer unna wrap was placed Bilateral lower extremity.   Plan:   Follow up in one week.  

## 2018-08-15 ENCOUNTER — Inpatient Hospital Stay: Payer: Medicare Other | Attending: Nurse Practitioner

## 2018-08-15 ENCOUNTER — Inpatient Hospital Stay (HOSPITAL_BASED_OUTPATIENT_CLINIC_OR_DEPARTMENT_OTHER): Payer: Medicare Other | Admitting: Nurse Practitioner

## 2018-08-15 ENCOUNTER — Inpatient Hospital Stay: Payer: Medicare Other

## 2018-08-15 VITALS — BP 144/67 | HR 101 | Temp 96.5°F | Wt 266.0 lb

## 2018-08-15 DIAGNOSIS — Z87891 Personal history of nicotine dependence: Secondary | ICD-10-CM | POA: Insufficient documentation

## 2018-08-15 DIAGNOSIS — E1143 Type 2 diabetes mellitus with diabetic autonomic (poly)neuropathy: Secondary | ICD-10-CM | POA: Diagnosis not present

## 2018-08-15 DIAGNOSIS — Z79899 Other long term (current) drug therapy: Secondary | ICD-10-CM

## 2018-08-15 DIAGNOSIS — Z794 Long term (current) use of insulin: Secondary | ICD-10-CM | POA: Insufficient documentation

## 2018-08-15 DIAGNOSIS — I10 Essential (primary) hypertension: Secondary | ICD-10-CM

## 2018-08-15 DIAGNOSIS — E7800 Pure hypercholesterolemia, unspecified: Secondary | ICD-10-CM | POA: Insufficient documentation

## 2018-08-15 DIAGNOSIS — Z9221 Personal history of antineoplastic chemotherapy: Secondary | ICD-10-CM | POA: Diagnosis not present

## 2018-08-15 DIAGNOSIS — C8318 Mantle cell lymphoma, lymph nodes of multiple sites: Secondary | ICD-10-CM | POA: Diagnosis not present

## 2018-08-15 DIAGNOSIS — Z95828 Presence of other vascular implants and grafts: Secondary | ICD-10-CM

## 2018-08-15 MED ORDER — SODIUM CHLORIDE 0.9% FLUSH
10.0000 mL | Freq: Once | INTRAVENOUS | Status: AC
  Administered 2018-08-15: 10 mL via INTRAVENOUS
  Filled 2018-08-15: qty 10

## 2018-08-15 MED ORDER — HEPARIN SOD (PORK) LOCK FLUSH 100 UNIT/ML IV SOLN
INTRAVENOUS | Status: AC
  Filled 2018-08-15: qty 5

## 2018-08-15 MED ORDER — HEPARIN SOD (PORK) LOCK FLUSH 100 UNIT/ML IV SOLN
500.0000 [IU] | Freq: Once | INTRAVENOUS | Status: AC
  Administered 2018-08-15: 500 [IU] via INTRAVENOUS

## 2018-08-15 NOTE — Progress Notes (Signed)
Survivorship Care Plan visit completed.  Treatment summary reviewed and given to patient.  ASCO answers booklet reviewed and given to patient.  CARE program and Cancer Transitions discussed with patient along with other resources cancer center offers to patients and caregivers.  Patient verbalized understanding.    

## 2018-08-15 NOTE — Progress Notes (Signed)
Survivorship Clinic Consult Note [facility]  Telephone:([phone] [phone]  Patient Care Team: [name], MD as PCP - General (Internal Medicine) Lloyd Huger, MD as Consulting Physician (Oncology) Lucky Cowboy Erskine Squibb, MD as Referring Physician (Vascular Surgery) Schnier, Dolores Lory, MD (Vascular Surgery) Corey Skains, MD as Consulting Physician (Cardiology)   Name of the patient: Gary [name]  [id number]  Jun 03, 1946   Date of visit: 08/15/18  CLINIC:  Survivorship   REASON FOR VISIT:  Routine follow-up post-treatment for a recent history of mantle cell lymphoma  BRIEF ONCOLOGIC HISTORY:  Patient's initial diagnosis was inguinal lymph node biopsy on 07/2014. Age 49 at time of diagnosis. He was asymptomatic and had elected for surveillance.   CT 10/19/17 for surveillance revealed clear progression.  Initiated palliative chemotherapy with rituximab and bendamustine on 11/09/2017.  He completed 4 cycles on 03/09/2018. Treatment discontinued d/t persistent thrombocytopenia and improved imaging.   CT from 07/04/2018 revealed: Stable, mildly abnormal lower retroperitoneal and pelvic adenopathy compared to prior exam.  6 mm groundglass density pulmonary nodule in the left lower lobe, aortic arteriosclerosis, coronary arteriosclerosis, emphysema, nonunited fracture of the right proximal humerus, new healing right anterior sixth rib fracture, additional old bilateral rib fractures, right nephrectomy.  EF- 55-65%.   INTERVAL HISTORY: Patient presents to the survivorship clinic today for initial meeting and to review survivorship care plan detailing treatment course for mantle cell lymphoma, as well as, monitoring long-term side effects of that treatment, education regarding health maintenance, screening, and overall wellness and health promotion.  Overall, he reports feeling well since completing treatment.  he offers no specific complaints  today.   REVIEW OF SYSTEMS:  Review of Systems  Constitutional: Negative for chills, fever, malaise/fatigue and weight loss.  HENT: Negative for congestion, ear discharge, ear pain, sinus pain, sore throat and tinnitus.   Eyes: Negative.   Respiratory: Negative.  Negative for cough, sputum production and shortness of breath.   Cardiovascular: Negative for chest pain, palpitations, orthopnea, claudication and leg swelling.  Gastrointestinal: Negative for abdominal pain, blood in stool, constipation, diarrhea, heartburn, nausea and vomiting.  Genitourinary: Negative.   Musculoskeletal: Positive for back pain and joint pain.  Skin: Negative.   Neurological: Negative for dizziness, tingling, weakness and headaches.  Endo/Heme/Allergies: Negative.   Psychiatric/Behavioral: Positive for depression. The patient is nervous/anxious.    ONCOLOGY TREATMENT TEAM:  1. Medical Oncologist: Dr. [name] 3. Radiation Oncologist: Dr. Baruch Gouty   PAST MEDICAL/SURGICAL HISTORY:  Past Medical History:  Diagnosis Date  . Depression   . 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 Gastroparesis   . Graves disease   . Hypercholesterolemia   . Hypertension   . Mantle cell lymphoma ([place]) 07/2014  . Peripheral neuropathy    Past Surgical History:  Procedure Laterality Date  . CHOLECYSTECTOMY    . NEPHRECTOMY     right  . PACEMAKER INSERTION N/A 12/07/2017   Procedure: INSERTION PACEMAKER DUAL CHAMBER INITIAL INSERT;  Surgeon: Isaias Cowman, MD;  Location: [facility] ORS;  Service: Cardiovascular;  Laterality: N/A;  . PORTA CATH INSERTION N/A 11/03/2017   Procedure: PORTA CATH INSERTION;  Surgeon: Katha Cabal, MD;  Location: [facility];  Service: Cardiovascular;  Laterality: N/A;    ALLERGIES:  Allergies  Allergen Reactions  . Rofecoxib Nausea Only    CURRENT MEDICATIONS:  Outpatient Encounter Medications as of 08/15/2018   Medication Sig  . ACCU-CHEK AVIVA PLUS test strip  USE AS INSTRUCTED. USE 1 STRIP EVERY 3 HOURS . ACCU-CHECK AVIVA PLUS TEST STRIP. DX R71.165  . carvedilol (COREG) 3.125 MG tablet Take 3.125 mg by mouth 2 (two) times daily with a meal.   . ferrous sulfate 325 (65 FE) MG tablet Take 325 mg by mouth daily.  Marland Kitchen GLUCAGON EMERGENCY 1 MG injection   . glucagon, human recombinant, (GLUCAGEN DIAGNOSTIC) 1 MG injection Use as directed  . glucose 4 GM chewable tablet Chew 1 tablet by mouth once as needed for low blood sugar.  Marland Kitchen glucose blood (PRECISION QID TEST) test strip Use 8 (eight) times daily as directed. BAYER CONTOUR NEXT TEST STRIPS E10.649  . insulin aspart (NOVOLOG) 100 UNIT/ML injection Basal rate 12am 0.8 units per hour, 9am 0.9 units per hour. (total basal insulin 20.7 units). Carbohydrate ratio 1 units for every 8gm of carbohydrate. Correction factor 1 units for every 50mg /dl over target cbg. Target CBG 80-120  . levothyroxine (SYNTHROID, LEVOTHROID) 25 MCG tablet Take 25 mcg by mouth daily.  Marland Kitchen lisinopril (PRINIVIL,ZESTRIL) 20 MG tablet Take 20 mg by mouth daily.  . Multiple Vitamin (MULTIVITAMIN) tablet Take 1 tablet by mouth daily.  . naproxen sodium (ALEVE) 220 MG tablet Take 220 mg by mouth daily as needed.  . pravastatin (PRAVACHOL) 40 MG tablet Take 1 tablet (40 mg total) by mouth at bedtime. Will need office visit for any more refills  . spironolactone (ALDACTONE) 25 MG tablet Take 25 mg by mouth daily.  Marland Kitchen acyclovir (ZOVIRAX) 400 MG tablet TAKE 1 TABLET BY MOUTH EVERY DAY (Patient not taking: Reported on 08/15/2018)  . cyanocobalamin (,VITAMIN B-12,) 1000 MCG/ML injection Inject into the muscle.  . diphenhydrAMINE-zinc acetate (BENADRYL EXTRA STRENGTH) cream Apply 1 application topically daily as needed for itching (Place on legs before Unna boot placement).  . furosemide (LASIX) 20 MG tablet Take 1 tablet (20 mg total) by mouth daily.  Marland Kitchen lidocaine-prilocaine (EMLA) cream Apply 1  application topically as needed. Apply to port prior to chemotherapy appointment, cover with plastic wrap. (Patient not taking: Reported on 08/15/2018)  . prochlorperazine (COMPAZINE) 10 MG tablet TAKE 1 TABLET (10 MG TOTAL) BY MOUTH EVERY 6 (SIX) HOURS AS NEEDED (NAUSEA OR VOMITING).   Facility-Administered Encounter Medications as of 08/15/2018  Medication  . heparin lock flush 100 unit/mL  . [COMPLETED] heparin lock flush 100 unit/mL  . sodium chloride flush (NS) 0.9 % injection 10 mL  . sodium chloride flush (NS) 0.9 % injection 10 mL  . [COMPLETED] sodium chloride flush (NS) 0.9 % injection 10 mL    ONCOLOGIC FAMILY HISTORY:  Family History  Problem Relation Age of Onset  . Breast cancer Mother   . Diabetes Father   . Cancer Father        Lymphoma  . Alcohol abuse Maternal Uncle   . Diabetes Sister   . Heart disease Unknown        grandfather    SOCIAL HISTORY:  Social History   Socioeconomic History  . Marital status: Married    Spouse name: Not on file  . Number of children: 1  . Years of education: Not on file  . Highest education level: Not on file  Occupational History  . Not on file  Social Needs  . Financial resource strain: Not on file  . Food insecurity:    Worry: Not on file    Inability: Not on file  . Transportation needs:    Medical: Not on file    Non-medical:  Not on file  Tobacco Use  . Smoking status: Former Research scientist (life sciences)  . Smokeless tobacco: Current User    Types: Chew  Substance and Sexual Activity  . Alcohol use: Yes    Alcohol/week: 3.0 standard drinks    Types: 3 Cans of beer per week    Comment: nightly  . Drug use: No  . Sexual activity: Not Currently  Lifestyle  . Physical activity:    Days per week: Not on file    Minutes per session: Not on file  . Stress: Not on file  Relationships  . Social connections:    Talks on phone: Not on file    Gets together: Not on file    Attends religious service: Not on file    Active member of  club or organization: Not on file    Attends meetings of clubs or organizations: Not on file    Relationship status: Not on file  Other Topics Concern  . Not on file  Social History Narrative  . Not on file    PHYSICAL EXAMINATION:  Vitals:   08/15/18 1412  BP: (!) 144/67  Pulse: (!) 101  Temp: (!) 96.5 F (35.8 C)   Filed Weights   08/15/18 1412  Weight: 266 lb (120.7 kg)   General: Well-nourished, well-appearing. No acute distress. unaccompanied HEENT: Head is normocephalic.  Pupils equal and reactive to light. Conjunctivae clear without exudate.  Sclerae anicteric. Oral mucosa is pink, moist.  Poor dentition Lymph: No cervical, supraclavicular, or infraclavicular lymphadenopathy noted on palpation.  Cardiovascular: Regular rate and rhythm. Respiratory: Clear to auscultation bilaterally. Chest expansion symmetric; breathing non-labored.  GI: Abdomen soft and round; non-tender, non-distended. Neuro: No focal deficits. Abnormal gait. Psych: Mood and affect normal and appropriate for situation.  Extremities: No edema. Skin: Warm and dry  LABORATORY DATA:  None for this visit.  DIAGNOSTIC IMAGING:  None for this visit.     ASSESSMENT AND PLAN:  MrDevonta Blanford is a 72 y.o. male, diagnosed with mantle cell lymphoma 08/03/2014, with progression of disease in 10/2017 and underwent Rituxan and Treanda chemotherapy for 4 cycles.  Chemotherapy given with palliative intent.  Chemotherapy discontinued due to persistent thrombocytopenia and stable disease on imaging.  He presents to survivorship clinic today for initial meeting and routine follow-up post completion of treatment for mantle cell lymphoma.   1. Mantle Cell Lymphoma - Mr. Eguia is continuing to recover from treatment for mantle cell lymphoma.  No evidence of progressive disease today.  He will continue to follow-up with medical oncologist, Dr. [name] every 6 months with surveillance imaging, labs, and physical exam per  surveillance protocol.      Today, a comprehensive survivorship care plan and treatment summary was reviewed with the patient today detailing cancer diagnosis, treatment course, potential late/long-term effects of treatment, appropriate follow-up care with recommendations for the future, and patient education resources.    2. Transitioning to Surveillance: Patient has an established relationship with primary care provider, Dr. Nicki Reaper, and today's survivorship care plan will be transmitted to PCP continue to provide long-term care for patient.   3. Port-a-Cath: Patient had port placed for administration of chemotherapy. It remains in place at this time. Discussed the need to flush the port every 6 to 8 weeks. We also discussed that medical oncology may consider port removal approximately 1 year post treatment. Patient verbalizes understanding and agrees to comply with port flush schedule.  4. Health maintenance and wellness promotion: Mr. Kindt was encouraged to consume  5-7 servings of fruits and vegetables per day. We reviewed the "Nutrition Rainbow" handout, as well as the handout "Take Control of Your Health and Reduce Your Cancer Risk" from the [facility].  he  was also encouraged to engage in moderate to vigorous exercise for 30 minutes per day most days of the week. We discussed the [employer] fitness program, which is designed for cancer survivors to help them become more physically fit after cancer treatments as well as the CARE program.  he was instructed to limit alcohol consumption and to abstain from tobacco use.   5. Support services/counseling: It is not uncommon for this period of the patient's cancer care trajectory to be one of many emotions and stressors.  We discussed an opportunity to participate in the next session of [facility] ("Finding Your New Normal") support group series designed for patients after they have completed treatment.   Mr. Rio was encouraged to take  advantage of our many other support services programs, support groups, and/or counseling in coping with life as a cancer survivor/after completing anti-cancer treatment.  he  was offered support today through active listening and expressive supportive counseling.  he was given information regarding our available services and encouraged to contact me with any questions or for help enrolling in any of our support group/programs.   6. Cancer screening:  Due to Mr. Stitely's history and her age, he should receive screening for skin cancers, colon cancer, oral/mouth, and prostate cancers.  The information and recommendations are listed on the patient's comprehensive care plan/treatment summary and were reviewed in detail with the patient.      Dispo:   -Return to cancer center on 01/04/2019 for follow up with Dr. [name] -Return back to the [facility] at any time; no additional follow-up needed at this time.  -Consider referral back to survivorship as a long-term survivor for continued surveillance  A total of (25) minutes of face-to-face time was spent with this patient with greater than 50% of that time in counseling and care-coordination.  Beckey Rutter, DNP, AGNP-C [facility] [place] (work cell) [phone] (office)  Note: PRIMARY CARE PROVIDER [name], [place] [phone]

## 2018-08-16 ENCOUNTER — Encounter (INDEPENDENT_AMBULATORY_CARE_PROVIDER_SITE_OTHER): Payer: Self-pay

## 2018-08-16 ENCOUNTER — Ambulatory Visit (INDEPENDENT_AMBULATORY_CARE_PROVIDER_SITE_OTHER): Payer: Medicare Other | Admitting: Nurse Practitioner

## 2018-08-16 VITALS — BP 122/64 | HR 77 | Resp 20 | Ht 70.0 in | Wt 262.0 lb

## 2018-08-16 DIAGNOSIS — R600 Localized edema: Secondary | ICD-10-CM

## 2018-08-16 NOTE — Progress Notes (Signed)
History of Present Illness  There is no documented history at this time  Assessments & Plan   There are no diagnoses linked to this encounter.    Additional instructions  Subjective:  Patient presents with venous ulcer of the Bilateral lower extremity.    Procedure:  3 layer unna wrap was placed Bilateral lower extremity.   Plan:   Follow up in one week.  

## 2018-08-22 ENCOUNTER — Encounter: Payer: Self-pay | Admitting: Nurse Practitioner

## 2018-08-23 ENCOUNTER — Ambulatory Visit (INDEPENDENT_AMBULATORY_CARE_PROVIDER_SITE_OTHER): Payer: Medicare Other | Admitting: Nurse Practitioner

## 2018-08-23 ENCOUNTER — Encounter (INDEPENDENT_AMBULATORY_CARE_PROVIDER_SITE_OTHER): Payer: Self-pay | Admitting: Nurse Practitioner

## 2018-08-23 VITALS — BP 146/75 | HR 111 | Resp 19 | Ht 70.0 in | Wt 263.0 lb

## 2018-08-23 DIAGNOSIS — I890 Lymphedema, not elsewhere classified: Secondary | ICD-10-CM | POA: Diagnosis not present

## 2018-08-23 NOTE — Progress Notes (Signed)
History of Present Illness  There is no documented history at this time  Assessments & Plan   There are no diagnoses linked to this encounter.    Additional instructions  Subjective:  Patient presents with venous ulcer of the Bilateral lower extremity.    Procedure:  3 layer unna wrap was placed Bilateral lower extremity.   Plan:   Follow up in one week.  

## 2018-08-30 ENCOUNTER — Encounter (INDEPENDENT_AMBULATORY_CARE_PROVIDER_SITE_OTHER): Payer: Self-pay | Admitting: Nurse Practitioner

## 2018-08-30 ENCOUNTER — Ambulatory Visit (INDEPENDENT_AMBULATORY_CARE_PROVIDER_SITE_OTHER): Payer: Medicare Other | Admitting: Nurse Practitioner

## 2018-08-30 VITALS — BP 167/77 | HR 89 | Resp 16 | Ht 70.0 in | Wt 263.0 lb

## 2018-08-30 DIAGNOSIS — I10 Essential (primary) hypertension: Secondary | ICD-10-CM | POA: Diagnosis not present

## 2018-08-30 DIAGNOSIS — I890 Lymphedema, not elsewhere classified: Secondary | ICD-10-CM | POA: Diagnosis not present

## 2018-08-30 DIAGNOSIS — K219 Gastro-esophageal reflux disease without esophagitis: Secondary | ICD-10-CM | POA: Diagnosis not present

## 2018-08-30 DIAGNOSIS — F17220 Nicotine dependence, chewing tobacco, uncomplicated: Secondary | ICD-10-CM

## 2018-08-30 DIAGNOSIS — L03116 Cellulitis of left lower limb: Secondary | ICD-10-CM

## 2018-08-30 MED ORDER — DOXYCYCLINE HYCLATE 100 MG PO CAPS: 100.0000 mg | ORAL_CAPSULE | Freq: Two times a day (BID) | ORAL | 0 refills | Status: DC

## 2018-08-30 NOTE — Progress Notes (Signed)
Subjective:    [name], male    DOB: 08-Dec-1945, 72 y.o.   MRN: [id number] Chief Complaint  Patient presents with  . Follow-up    unna check    HPI  Gary [name] is a 72 y.o. male is following up on Unna wrap therapy.  His legs are still fairly edematous today however there is also an area of erythema that was not there previously.  He has also a new small blister on his right lower extremity.  His legs are erythematous in general.he swelling has persisted and the pain associated with swelling continues.   The patient also states elevation during the day and exercise is being done too.  Patient denies any fever, chills, nausea, vomiting and diarrhea.  Patient denies any chest pain or shortness of breath.  Patient denies any TIA-like symptoms or amaurosis fugax. Past Medical History:  Diagnosis Date  . Depression   . 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 Gastroparesis   . Graves disease   . Hypercholesterolemia   . Hypertension   . Mantle cell lymphoma ([place]) 07/2014  . Peripheral neuropathy     Past Surgical History:  Procedure Laterality Date  . CHOLECYSTECTOMY    . NEPHRECTOMY     right  . PACEMAKER INSERTION N/A 12/07/2017   Procedure: INSERTION PACEMAKER DUAL CHAMBER INITIAL INSERT;  Surgeon: Isaias Cowman, MD;  Location: [facility];  Service: Cardiovascular;  Laterality: N/A;  . PORTA CATH INSERTION N/A 11/03/2017   Procedure: PORTA CATH INSERTION;  Surgeon: Katha Cabal, MD;  Location: [place] [facility];  Service: Cardiovascular;  Laterality: N/A;    Social History   Socioeconomic History  . Marital status: Married    Spouse name: Not on file  . Number of children: 1  . Years of education: Not on file  . Highest education level: Not on file  Occupational History  . Not on file  Social Needs  . Financial resource strain: Not on file  . Food insecurity:    Worry: Not on file      Inability: Not on file  . Transportation needs:    Medical: Not on file    Non-medical: Not on file  Tobacco Use  . Smoking status: Former Research scientist (life sciences)  . Smokeless tobacco: Current User    Types: Chew  Substance and Sexual Activity  . Alcohol use: Yes    Alcohol/week: 3.0 standard drinks    Types: 3 Cans of beer per week    Comment: nightly  . Drug use: No  . Sexual activity: Not Currently  Lifestyle  . Physical activity:    Days per week: Not on file    Minutes per session: Not on file  . Stress: Not on file  Relationships  . Social connections:    Talks on phone: Not on file    Gets together: Not on file    Attends religious service: Not on file    Active member of club or organization: Not on file    Attends meetings of clubs or organizations: Not on file    Relationship status: Not on file  . Intimate partner violence:    Fear of current or ex partner: Not on file    Emotionally abused: Not on file    Physically abused: Not on file    Forced sexual activity: Not on file  Other Topics Concern  . Not on file  Social History Narrative  . Not on file    Family History  Problem Relation Age of Onset  . Breast cancer Mother   . Diabetes Father   . Cancer Father        Lymphoma  . Alcohol abuse Maternal Uncle   . Diabetes Sister   . Heart disease Unknown        grandfather    Allergies  Allergen Reactions  . Rofecoxib Nausea Only     Review of Systems   Review of Systems: Negative Unless Checked Constitutional: [] Weight loss  [] Fever  [] Chills Cardiac: [] Chest pain   []  Atrial Fibrillation  [] Palpitations   [] Shortness of breath when laying flat   [] Shortness of breath with exertion. Vascular:  [] Pain in legs with walking   [] Pain in legs with standing  [] History of DVT   [] Phlebitis   [x] Swelling in legs   [] Varicose veins   [] Non-healing ulcers Pulmonary:   [] Uses home oxygen   [] Productive cough   [] Hemoptysis   [] Wheeze  [] COPD   [] Asthma Neurologic:   [] Dizziness   [] Seizures   [] History of stroke   [] History of TIA  [] Aphasia   [] Vissual changes   [] Weakness or numbness in arm   [] Weakness or numbness in leg Musculoskeletal:   [] Joint swelling   [] Joint pain   [] Low back pain  []  History of Knee Replacement Hematologic:  [] Easy bruising  [] Easy bleeding   [] Hypercoagulable state   [x] Anemic Gastrointestinal:  [] Diarrhea   [] Vomiting  [x] Gastroesophageal reflux/heartburn   [] Difficulty swallowing. Genitourinary:  [x] Chronic kidney disease   [] Difficult urination  [] Anuric   [] Blood in urine Skin:  [] Rashes   [] Ulcers  Psychological:  [] History of anxiety   [x]  History of major depression  []  Memory Difficulties     Objective:   Physical Exam  BP (!) 167/77 (BP Location: Right Arm)   Pulse 89   Resp 16   Ht 5\' 10"  (1.778 m)   Wt 263 lb (119.3 kg)   BMI 37.74 kg/m   Gen: WD/WN, NAD Head: /AT, No temporalis wasting.  Ear/Nose/Throat: nares w/o erythema or drainage Eyes: PER, EOMI, sclera nonicteric.  Neck: Supple, no masses.  No JVD.  Pulmonary:  Good air movement, no use of accessory muscles.  Cardiac: RRR Vascular: very erythematous left foot, some on right.  Small blister on right leg Vessel Right Left  Radial Palpable Palpable   Gastrointestinal: soft, non-distended. No guarding/no peritoneal signs.  Musculoskeletal: M/S 5/5 throughout.  No deformity or atrophy.  Neurologic: Pain and light touch intact in extremities.  Symmetrical.  Speech is fluent. Motor exam as listed above. Psychiatric: Judgment intact, Mood & affect appropriate for pt's clinical situation. Dermatologic: No Venous rashes. No Ulcers Noted.  No changes consistent with cellulitis. Lymph : No Cervical lymphadenopathy, lichenification present on feet      Assessment & Plan:   1. Lymphedema No surgery or intervention at this point in time.    I have had a long discussion with the patient regarding Lymphedema and why it  causes symptoms, specifically  venous ulceration . I have discussed with the patient the chronic skin changes that accompany venous insufficiency and the long term sequela such as infection and recurring  ulceration.  Patient will be placed in [employer] which will be changed weekly drainage permitting.  In addition, behavioral modification including several periods of elevation of the lower extremities during the day will be continued. Achieving a position with the ankles at heart  level was stressed to the patient  The patient is instructed to begin routine exercise, especially walking on a daily basis   The patient can be assessed for graduated compression stockings or wraps as well as a Lymph Pump once the ulcers are healed.   2. Cellulitis of leg, left Much more erythematous than last visit.  We will do a course of doxycycline to treat cellulitis.  - doxycycline (VIBRAMYCIN) 100 MG capsule; Take 1 capsule (100 mg total) by mouth 2 (two) times daily.  Dispense: 28 capsule; Refill: 0  3. Gastroesophageal reflux disease, esophagitis presence not specified Continue PPI as already ordered, this medication has been reviewed and there are no changes at this time.  Avoidence of caffeine and alcohol  Moderate elevation of the head of the bed   4. Essential hypertension, benign Continue antihypertensive medications as already ordered, these medications have been reviewed and there are no changes at this time.    Current Outpatient Medications on File Prior to Visit  Medication Sig Dispense Refill  . ACCU-CHEK AVIVA PLUS test strip USE AS INSTRUCTED. USE 1 STRIP EVERY 3 HOURS . ACCU-CHECK AVIVA PLUS TEST STRIP. DX E10.649  12  . carvedilol (COREG) 3.125 MG tablet Take 3.125 mg by mouth 2 (two) times daily with a meal.     . cyanocobalamin (,VITAMIN B-12,) 1000 MCG/ML injection Inject into the muscle.    . diphenhydrAMINE-zinc acetate (BENADRYL EXTRA STRENGTH) cream Apply 1 application topically daily as needed for itching  (Place on legs before Unna boot placement).    . ferrous sulfate 325 (65 FE) MG tablet Take 325 mg by mouth daily.    Marland Kitchen GLUCAGON EMERGENCY 1 MG injection     . glucagon, human recombinant, (GLUCAGEN DIAGNOSTIC) 1 MG injection Use as directed    . glucose 4 GM chewable tablet Chew 1 tablet by mouth once as needed for low blood sugar.    Marland Kitchen glucose blood (PRECISION QID TEST) test strip Use 8 (eight) times daily as directed. BAYER CONTOUR NEXT TEST STRIPS E10.649    . insulin aspart (NOVOLOG) 100 UNIT/ML injection Basal rate 12am 0.8 units per hour, 9am 0.9 units per hour. (total basal insulin 20.7 units). Carbohydrate ratio 1 units for every 8gm of carbohydrate. Correction factor 1 units for every 50mg /dl over target cbg. Target CBG 80-120 1 vial 12  . levothyroxine (SYNTHROID, LEVOTHROID) 25 MCG tablet Take 25 mcg by mouth daily.    Marland Kitchen lisinopril (PRINIVIL,ZESTRIL) 20 MG tablet Take 20 mg by mouth daily.    . Multiple Vitamin (MULTIVITAMIN) tablet Take 1 tablet by mouth daily.    . naproxen sodium (ALEVE) 220 MG tablet Take 220 mg by mouth daily as needed.    . pravastatin (PRAVACHOL) 40 MG tablet Take 1 tablet (40 mg total) by mouth at bedtime. Will need office visit for any more refills 30 tablet 0  . prochlorperazine (COMPAZINE) 10 MG tablet TAKE 1 TABLET (10 MG TOTAL) BY MOUTH EVERY 6 (SIX) HOURS AS NEEDED (NAUSEA OR VOMITING).    Marland Kitchen spironolactone (ALDACTONE) 25 MG tablet Take 25 mg by mouth daily.    Marland Kitchen acyclovir (ZOVIRAX) 400 MG tablet TAKE 1 TABLET BY MOUTH EVERY DAY (Patient not taking: Reported on 08/15/2018) 30 tablet 3  . furosemide (LASIX) 20 MG tablet Take 1 tablet (20 mg total) by mouth daily. 30 tablet 1  . lidocaine-prilocaine (EMLA) cream Apply 1 application topically as needed. Apply to port prior to chemotherapy appointment, cover with plastic wrap. (Patient  not taking: Reported on 08/15/2018) 30 g 2   Current Facility-Administered Medications on File Prior to Visit  Medication Dose  Route Frequency Provider Last Rate Last Dose  . heparin lock flush 100 unit/mL  500 Units Intravenous Once Lloyd Huger, MD      . sodium chloride flush (NS) 0.9 % injection 10 mL  10 mL Intravenous PRN Lloyd Huger, MD   10 mL at 02/01/18 0829  . sodium chloride flush (NS) 0.9 % injection 10 mL  10 mL Intravenous PRN Lloyd Huger, MD   10 mL at 04/05/18 0800    There are no Patient Instructions on file for this visit. Return in about 1 week (around 09/06/2018).   Kris Hartmann, NP  This note was completed with Sales executive.  Any errors are purely unintentional.

## 2018-09-06 ENCOUNTER — Encounter (INDEPENDENT_AMBULATORY_CARE_PROVIDER_SITE_OTHER): Payer: Self-pay

## 2018-09-06 ENCOUNTER — Ambulatory Visit (INDEPENDENT_AMBULATORY_CARE_PROVIDER_SITE_OTHER): Payer: Medicare Other | Admitting: Nurse Practitioner

## 2018-09-06 VITALS — BP 142/81 | HR 88 | Resp 20 | Ht 70.0 in | Wt 265.0 lb

## 2018-09-06 DIAGNOSIS — L97929 Non-pressure chronic ulcer of unspecified part of left lower leg with unspecified severity: Secondary | ICD-10-CM

## 2018-09-06 DIAGNOSIS — L97919 Non-pressure chronic ulcer of unspecified part of right lower leg with unspecified severity: Secondary | ICD-10-CM | POA: Diagnosis not present

## 2018-09-06 DIAGNOSIS — I890 Lymphedema, not elsewhere classified: Secondary | ICD-10-CM | POA: Diagnosis not present

## 2018-09-06 NOTE — Progress Notes (Signed)
History of Present Illness  There is no documented history at this time  Assessments & Plan   There are no diagnoses linked to this encounter.    Additional instructions  Subjective:  Patient presents with venous ulcer of the Bilateral lower extremity.    Procedure:  3 layer unna wrap was placed Bilateral lower extremity.   Plan:   Follow up in one week.  

## 2018-09-07 ENCOUNTER — Encounter (INDEPENDENT_AMBULATORY_CARE_PROVIDER_SITE_OTHER): Payer: Self-pay | Admitting: Nurse Practitioner

## 2018-09-13 ENCOUNTER — Encounter (INDEPENDENT_AMBULATORY_CARE_PROVIDER_SITE_OTHER): Payer: Self-pay

## 2018-09-13 ENCOUNTER — Ambulatory Visit (INDEPENDENT_AMBULATORY_CARE_PROVIDER_SITE_OTHER): Payer: Medicare Other | Admitting: Nurse Practitioner

## 2018-09-13 VITALS — BP 119/71 | HR 85 | Resp 22 | Ht 70.0 in | Wt 272.0 lb

## 2018-09-13 DIAGNOSIS — I890 Lymphedema, not elsewhere classified: Secondary | ICD-10-CM | POA: Diagnosis not present

## 2018-09-13 NOTE — Progress Notes (Signed)
History of Present Illness  There is no documented history at this time  Assessments & Plan   There are no diagnoses linked to this encounter.    Additional instructions  Subjective:  Patient presents with venous ulcer of the Bilateral lower extremity.    Procedure:  3 layer unna wrap was placed Bilateral lower extremity.   Plan:   Follow up in one week.  

## 2018-09-14 ENCOUNTER — Ambulatory Visit (INDEPENDENT_AMBULATORY_CARE_PROVIDER_SITE_OTHER): Payer: Medicare Other

## 2018-09-14 ENCOUNTER — Ambulatory Visit (INDEPENDENT_AMBULATORY_CARE_PROVIDER_SITE_OTHER): Payer: Medicare Other | Admitting: Internal Medicine

## 2018-09-14 VITALS — BP 136/70 | HR 67 | Temp 97.4°F | Resp 18 | Wt 271.4 lb

## 2018-09-14 DIAGNOSIS — R600 Localized edema: Secondary | ICD-10-CM

## 2018-09-14 DIAGNOSIS — I428 Other cardiomyopathies: Secondary | ICD-10-CM

## 2018-09-14 DIAGNOSIS — I429 Cardiomyopathy, unspecified: Secondary | ICD-10-CM

## 2018-09-14 DIAGNOSIS — I890 Lymphedema, not elsewhere classified: Secondary | ICD-10-CM | POA: Diagnosis not present

## 2018-09-14 DIAGNOSIS — M25512 Pain in left shoulder: Secondary | ICD-10-CM | POA: Diagnosis not present

## 2018-09-14 DIAGNOSIS — M19012 Primary osteoarthritis, left shoulder: Secondary | ICD-10-CM | POA: Diagnosis not present

## 2018-09-14 DIAGNOSIS — N183 Chronic kidney disease, stage 3 unspecified: Secondary | ICD-10-CM

## 2018-09-14 DIAGNOSIS — E7800 Pure hypercholesterolemia, unspecified: Secondary | ICD-10-CM

## 2018-09-14 DIAGNOSIS — I10 Essential (primary) hypertension: Secondary | ICD-10-CM | POA: Diagnosis not present

## 2018-09-14 DIAGNOSIS — E1151 Type 2 diabetes mellitus with diabetic peripheral angiopathy without gangrene: Secondary | ICD-10-CM

## 2018-09-14 DIAGNOSIS — D649 Anemia, unspecified: Secondary | ICD-10-CM | POA: Diagnosis not present

## 2018-09-14 DIAGNOSIS — I2510 Atherosclerotic heart disease of native coronary artery without angina pectoris: Secondary | ICD-10-CM

## 2018-09-14 DIAGNOSIS — I5022 Chronic systolic (congestive) heart failure: Secondary | ICD-10-CM

## 2018-09-14 DIAGNOSIS — Z23 Encounter for immunization: Secondary | ICD-10-CM | POA: Diagnosis not present

## 2018-09-14 DIAGNOSIS — E079 Disorder of thyroid, unspecified: Secondary | ICD-10-CM

## 2018-09-14 DIAGNOSIS — C8318 Mantle cell lymphoma, lymph nodes of multiple sites: Secondary | ICD-10-CM

## 2018-09-14 DIAGNOSIS — R944 Abnormal results of kidney function studies: Secondary | ICD-10-CM | POA: Diagnosis not present

## 2018-09-14 LAB — BASIC METABOLIC PANEL
BUN: 32 mg/dL — AB (ref 6–23)
CHLORIDE: 111 meq/L (ref 96–112)
CO2: 20 mEq/L (ref 19–32)
Calcium: 8.3 mg/dL — ABNORMAL LOW (ref 8.4–10.5)
Creatinine, Ser: 1.83 mg/dL — ABNORMAL HIGH (ref 0.40–1.50)
GFR: 39.07 mL/min — AB (ref >60.00)
Glucose, Bld: 71 mg/dL (ref 70–99)
Potassium: 4.1 mEq/L (ref 3.5–5.1)
SODIUM: 141 meq/L (ref 135–145)

## 2018-09-14 NOTE — Progress Notes (Signed)
[name], male   DOB: [birth year], 72 y.o.   MRN: [id number]   Subjective:    [name], male    DOB: 05/19/1946, 72 y.o.   MRN: [id number]  HPI  Patient here for a scheduled follow up.  Seeing vascular surgery weekly to have legs wrapped.  Recent increased swelling.  Just had wrapped yesterday.  Also being followed by oncology for mantle cell lymphoma.    CT 06/2018 - stable, mildly abnormal lower retroperitoneal and pelvic adenopathy compared to prior exam 46mm pulmonary nodule in the left lower lobe.  See oncology note.  Continues to f/u with Dr [name].  Is also followed by cardiology for chronic systolic CHF, CAD and hypertension.  Last evaluated 07/27/18. No changes made.  He comes in today stating he feels things are relatively stable.  Sees endocrinology for his diabetes.  Has Dexcom sensor now.  Has problems with hypoglycemia unawareness and severe hypoglycemia.  Discussed importance of eating regular meals.  Continue f/u with endocrinology.  Reviewed labs.  GFR decreased.  No chest pain.  Breathing stable.  No abdominal pain.  Bowels moving.  Main complaint today is shoulder pain.  Previously fell and fractured right shoulder.  No right shoulder pain now.  Has started having increased pain and limited rom in left shoulder.  Taking an increased amount of ibuprofen.  States he is taking 2 q 4 hours.  Discussed the need to stop the antiinflammatories given his worsening renal function.  Left shoulder pain started 2.5 weeks ago.  Is some better, but still with increased pain.  Denies any new injury.     Past Medical History:  Diagnosis Date  . Depression   . 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 Gastroparesis   . Graves disease   . Hypercholesterolemia   . Hypertension   . Mantle cell lymphoma ([place]) 07/2014  . Peripheral neuropathy    Past Surgical History:  Procedure Laterality Date  . CHOLECYSTECTOMY      . NEPHRECTOMY     right  . PACEMAKER INSERTION N/A 12/07/2017   Procedure: INSERTION PACEMAKER DUAL CHAMBER INITIAL INSERT;  Surgeon: Isaias Cowman, MD;  Location: [facility];  Service: Cardiovascular;  Laterality: N/A;  . PORTA CATH INSERTION N/A 11/03/2017   Procedure: PORTA CATH INSERTION;  Surgeon: Katha Cabal, MD;  Location: [place] [facility];  Service: Cardiovascular;  Laterality: N/A;   Family History  Problem Relation Age of Onset  . Breast cancer Mother   . Diabetes Father   . Cancer Father        Lymphoma  . Alcohol abuse Maternal Uncle   . Diabetes Sister   . Heart disease Unknown        grandfather   Social History   Socioeconomic History  . Marital status: Married    Spouse name: Not on file  . Number of children: 1  . Years of education: Not on file  . Highest education level: Not on file  Occupational History  . Not on file  Social Needs  . Financial resource strain: Not on file  . Food insecurity:    Worry: Not on file    Inability: Not on file  . Transportation needs:    Medical: Not on file    Non-medical: Not on file  Tobacco Use  . Smoking status: Former Research scientist (life sciences)  . Smokeless tobacco: Current User    Types: Chew  Substance and Sexual Activity  . Alcohol use: Yes    Alcohol/week: 3.0 standard drinks    Types: 3 Cans of beer per week    Comment: nightly  . Drug use: No  . Sexual activity: Not Currently  Lifestyle  . Physical activity:    Days per week: Not on file    Minutes per session: Not on file  . Stress: Not on file  Relationships  . Social connections:    Talks on phone: Not on file    Gets together: Not on file    Attends religious service: Not on file    Active member of club or organization: Not on file    Attends meetings of clubs or organizations: Not on file    Relationship status: Not on file  Other Topics Concern  . Not on file  Social History Narrative  . Not on file    Outpatient Encounter Medications as  of 09/14/2018  Medication Sig  . ACCU-CHEK AVIVA PLUS test strip USE AS INSTRUCTED. USE 1 STRIP EVERY 3 HOURS . ACCU-CHECK AVIVA PLUS TEST STRIP. DX O29.476  . carvedilol (COREG) 3.125 MG tablet Take 3.125 mg by mouth 2 (two) times daily with a meal.   . cyanocobalamin (,VITAMIN B-12,) 1000 MCG/ML injection Inject into the muscle.  . diphenhydrAMINE-zinc acetate (BENADRYL EXTRA STRENGTH) cream Apply 1 application topically daily as needed for itching (Place on legs before Unna boot placement).  Marland Kitchen doxycycline (VIBRAMYCIN) 100 MG capsule Take 1 capsule (100 mg total) by mouth 2 (two) times daily.  . ferrous sulfate 325 (65 FE) MG tablet Take 325 mg by mouth daily.  . furosemide (LASIX) 20 MG tablet Take 1 tablet (20 mg total) by mouth daily.  Marland Kitchen GLUCAGON EMERGENCY 1 MG injection   . glucagon, human recombinant, (GLUCAGEN DIAGNOSTIC) 1 MG injection Use as directed  . glucose 4 GM chewable tablet Chew 1 tablet by mouth once as needed for low blood sugar.  Marland Kitchen glucose blood (PRECISION QID TEST) test strip Use 8 (eight) times daily as directed. BAYER CONTOUR NEXT TEST STRIPS E10.649  . insulin aspart (NOVOLOG) 100 UNIT/ML injection Basal rate 12am 0.8 units per hour, 9am 0.9 units per hour. (total basal insulin 20.7 units). Carbohydrate ratio 1 units for every 8gm of carbohydrate. Correction factor 1 units for every 50mg /dl over target cbg. Target CBG 80-120  . levothyroxine (SYNTHROID, LEVOTHROID) 25 MCG tablet Take 25 mcg by mouth daily.  Marland Kitchen lidocaine-prilocaine (EMLA) cream Apply 1 application topically as needed. Apply to port prior to chemotherapy appointment, cover with plastic wrap. (Patient not taking: Reported on 08/15/2018)  . lisinopril (PRINIVIL,ZESTRIL) 20 MG tablet Take 20 mg by mouth daily.  . Multiple Vitamin (MULTIVITAMIN) tablet Take 1 tablet by mouth daily.  . pravastatin (PRAVACHOL) 40 MG tablet Take 1 tablet (40 mg total) by mouth at bedtime. Will need office visit for any more refills  .  prochlorperazine (COMPAZINE) 10 MG tablet TAKE 1 TABLET (10 MG TOTAL) BY MOUTH EVERY 6 (SIX) HOURS AS NEEDED (NAUSEA OR VOMITING).  Marland Kitchen spironolactone (ALDACTONE) 25 MG tablet Take 25 mg by mouth daily.  . [DISCONTINUED] acyclovir (ZOVIRAX) 400 MG tablet TAKE 1 TABLET BY MOUTH EVERY DAY (Patient not taking: Reported on 08/15/2018)  . [DISCONTINUED] naproxen sodium (ALEVE) 220 MG tablet Take 220 mg by mouth daily as needed.   Facility-Administered Encounter Medications as of 09/14/2018  Medication  . heparin lock flush 100 unit/mL  . sodium chloride flush (NS) 0.9 % injection  10 mL  . sodium chloride flush (NS) 0.9 % injection 10 mL    Review of Systems  Constitutional: Negative for appetite change and unexpected weight change.  HENT: Negative for congestion and sinus pressure.   Respiratory: Negative for cough and chest tightness.        Breathing stable.    Cardiovascular: Positive for leg swelling. Negative for chest pain and palpitations.  Gastrointestinal: Negative for abdominal pain, diarrhea, nausea and vomiting.  Genitourinary: Negative for difficulty urinating and dysuria.  Musculoskeletal: Negative for myalgias.       Previous right shoulder pain improved.  Not a significant problem for him now. Left shoulder pian now.  Limited rom.  Increased pain with increased rom.    Skin: Negative for rash.  Neurological: Negative for dizziness, light-headedness and headaches.  Psychiatric/Behavioral: Negative for agitation and dysphoric mood.       Objective:    Physical Exam  Constitutional: He appears well-developed and well-nourished. No distress.  HENT:  Nose: Nose normal.  Mouth/Throat: Oropharynx is clear and moist.  Neck: Neck supple. No thyromegaly present.  Cardiovascular: Normal rate and regular rhythm.  Pulmonary/Chest: Effort normal and breath sounds normal. No respiratory distress.  Abdominal: Soft. Bowel sounds are normal. There is no tenderness.  Musculoskeletal:    Pedal and lower extremity edema.  Wrapped - lower extremities.  Increased pain left shoulder with attempts at full extension and with abduction/adduction.   Limited rom.   Lymphadenopathy:    He has no cervical adenopathy.  Skin: No rash noted.  Legs wrapped. No visualized erythema.    Psychiatric: He has a normal mood and affect. His behavior is normal.    BP 136/70 (BP Location: Left Arm, Patient Position: Sitting, Cuff Size: Normal)   Pulse 67   Temp (!) 97.4 F (36.3 C) (Oral)   Resp 18   Wt 271 lb 6.4 oz (123.1 kg)   SpO2 98%   BMI 38.94 kg/m  Wt Readings from Last 3 Encounters:  09/14/18 271 lb 6.4 oz (123.1 kg)  09/13/18 272 lb (123.4 kg)  09/06/18 265 lb (120.2 kg)     Lab Results  Component Value Date   WBC 1.6 (L) 07/14/2018   HGB 11.3 (L) 07/14/2018   HCT 31.9 (L) 07/14/2018   PLT 128 (L) 07/14/2018   GLUCOSE 71 09/14/2018   CHOL 197 08/09/2015   TRIG 51.0 08/09/2015   HDL 73.60 08/09/2015   LDLCALC 113 (H) 08/09/2015   ALT 18 07/14/2018   AST 35 07/14/2018   NA 141 09/14/2018   K 4.1 09/14/2018   CL 111 09/14/2018   CREATININE 1.83 (H) 09/14/2018   BUN 32 (H) 09/14/2018   CO2 20 09/14/2018   TSH 3.894 10/28/2017   INR 1.12 07/14/2018   HGBA1C 5.6 10/28/2017   MICROALBUR 1.9 09/22/2016    Ct Head Wo Contrast  Result Date: 07/14/2018 CLINICAL DATA:  Fall on Coumadin.  Bruising to forehead. EXAM: CT HEAD WITHOUT CONTRAST TECHNIQUE: Contiguous axial images were obtained from the base of the skull through the vertex without intravenous contrast. COMPARISON:  Head CT 10/28/2017 FINDINGS: Brain: Unchanged degree of atrophy and chronic small vessel ischemia. Remote lacunar infarct in right basal ganglia. No intracranial hemorrhage, mass effect, or midline shift. No hydrocephalus. The basilar cisterns are patent. No evidence of territorial infarct or acute ischemia. No extra-axial or intracranial fluid collection. Vascular: Atherosclerosis of skullbase  vasculature without hyperdense vessel or abnormal calcification. Skull: No fracture or focal lesion. Sinuses/Orbits:  Minimal mucosal thickening of ethmoid air cells. No sinus fluid level. Bilateral cataract resection. Other: None. IMPRESSION: 1.  No acute intracranial abnormality.  No skull fracture. 2. Unchanged atrophy and chronic small vessel ischemia. Electronically Signed   By: Keith Rake M.D.   On: 07/14/2018 04:57   Dg Chest Port 1 View  Result Date: 07/14/2018 CLINICAL DATA:  Fall, weakness. EXAM: PORTABLE CHEST 1 VIEW COMPARISON:  CT 07/04/2018, most recent radiograph 12/07/2017 FINDINGS: Right chest port with tip in the mid SVC. Left-sided pacemaker in place. Chronic cardiomegaly appears similar to prior exams. No pulmonary edema, focal airspace disease, large pleural effusion or pneumothorax. Subsegmental atelectasis or scarring in the left lung. 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 IMPRESSION: Chronic cardiomegaly. Subsegmental atelectasis or scarring in the left lung. Electronically Signed   By: Keith Rake M.D.   On: 07/14/2018 04:22       Assessment & Plan:   Problem List Items Addressed This Visit    Anemia    Being followed by oncology.        Bilateral leg edema    Seeing vascular surgery weekly with weekly wraps.  Just evaluated yesterday.        CAD in native artery    Followed by cardiology.  Continue risk factor modification.        Cardiomyopathy, idiopathic ([place])    Followed by cardiology.       Chronic systolic CHF (congestive heart failure) (HCC)    Breathing stable.  Followed by cardiology.  Recent worsening renal function.  Recheck today.  May need to decrease lasix.  Follow.        CKD (chronic kidney disease) stage 3, GFR 30-59 ml/min (HCC)    Reviewed recent labs.  GFR decreased.  Discussed with him today.  Taking an increased amount of ibuprofen.  Was asked to stop.  Recheck metabolic panel.  May need  nephrology evaluation.  S/p right nephrectomy.        Diabetes ([place])    Followed by endocrinology.  Has had problems with hypoglycemia unawareness and severe hypoglycemia.  Has Dexcom sensor now.  Continue f/u with endocrinology.  Blood sugar in the 70s today prior to lab draw.  Was given juice and crackers and sugar noted to be 88 prior to leaving.  Was increasing.  Pt felt fine and was on his way to eat.  Keep w/u with endocrinology.  Discussed importance of eating regular meals, etc.       Essential hypertension, benign    Blood pressure under good control.  Continue same medication regimen.  Follow pressures.  Follow metabolic panel.        Hypercholesterolemia    On pravastatin.  Low cholesterol diet and exercise.  Follow lipid panel.        Left shoulder pain - Primary    Persistent pain with limited rom.  Increased pain with increased rom.  Check xray.  Discussed ortho evaluation.        Relevant Orders   DG Shoulder Left (Completed)   Lymphedema    Followed by vascular surgery.  Seeing them weekly to have legs wrapped.  Just evaluated yesterday.        Mantle cell lymphoma ([place])    Followed by oncology.        Thyroid disease    Followed by endocrinology.         Other Visit Diagnoses    Decreased GFR  Relevant Orders   Basic metabolic panel (Completed)   Encounter for immunization       Relevant Orders   Flu vaccine HIGH DOSE PF (Completed)       [name], MD

## 2018-09-15 ENCOUNTER — Other Ambulatory Visit: Payer: Self-pay | Admitting: Internal Medicine

## 2018-09-15 ENCOUNTER — Encounter: Payer: Self-pay | Admitting: Internal Medicine

## 2018-09-15 DIAGNOSIS — R944 Abnormal results of kidney function studies: Secondary | ICD-10-CM

## 2018-09-15 DIAGNOSIS — N183 Chronic kidney disease, stage 3 unspecified: Secondary | ICD-10-CM

## 2018-09-15 DIAGNOSIS — M25512 Pain in left shoulder: Secondary | ICD-10-CM

## 2018-09-15 NOTE — Progress Notes (Signed)
Order placed for nephrology referral.   °

## 2018-09-15 NOTE — Progress Notes (Signed)
Order placed for ortho referral.   

## 2018-09-16 DIAGNOSIS — N183 Chronic kidney disease, stage 3 unspecified: Secondary | ICD-10-CM | POA: Insufficient documentation

## 2018-09-16 DIAGNOSIS — M25512 Pain in left shoulder: Secondary | ICD-10-CM | POA: Insufficient documentation

## 2018-09-16 NOTE — Assessment & Plan Note (Signed)
Followed by vascular surgery.  Seeing them weekly to have legs wrapped.  Just evaluated yesterday.

## 2018-09-16 NOTE — Assessment & Plan Note (Signed)
Being followed by oncology. ?

## 2018-09-16 NOTE — Assessment & Plan Note (Signed)
Followed by oncology 

## 2018-09-16 NOTE — Assessment & Plan Note (Signed)
Followed by endocrinology.  Has had problems with hypoglycemia unawareness and severe hypoglycemia.  Has Dexcom sensor now.  Continue f/u with endocrinology.  Blood sugar in the 70s today prior to lab draw.  Was given juice and crackers and sugar noted to be 88 prior to leaving.  Was increasing.  Pt felt fine and was on his way to eat.  Keep w/u with endocrinology.  Discussed importance of eating regular meals, etc.

## 2018-09-16 NOTE — Assessment & Plan Note (Signed)
Persistent pain with limited rom.  Increased pain with increased rom.  Check xray.  Discussed ortho evaluation.

## 2018-09-16 NOTE — Assessment & Plan Note (Signed)
Followed by endocrinology 

## 2018-09-16 NOTE — Assessment & Plan Note (Signed)
Seeing vascular surgery weekly with weekly wraps.  Just evaluated yesterday.

## 2018-09-16 NOTE — Assessment & Plan Note (Signed)
Blood pressure under good control.  Continue same medication regimen.  Follow pressures.  Follow metabolic panel.   

## 2018-09-16 NOTE — Assessment & Plan Note (Signed)
Followed by cardiology 

## 2018-09-16 NOTE — Assessment & Plan Note (Signed)
Followed by cardiology.  Continue risk factor modification.   

## 2018-09-16 NOTE — Assessment & Plan Note (Signed)
On pravastatin.  Low cholesterol diet and exercise.  Follow lipid panel.   

## 2018-09-16 NOTE — Assessment & Plan Note (Signed)
Breathing stable.  Followed by cardiology.  Recent worsening renal function.  Recheck today.  May need to decrease lasix.  Follow.

## 2018-09-16 NOTE — Assessment & Plan Note (Addendum)
Reviewed recent labs.  GFR decreased.  Discussed with him today.  Taking an increased amount of ibuprofen.  Was asked to stop.  Recheck metabolic panel.  May need nephrology evaluation.  S/p right nephrectomy.

## 2018-09-20 ENCOUNTER — Ambulatory Visit (INDEPENDENT_AMBULATORY_CARE_PROVIDER_SITE_OTHER): Payer: Medicare Other | Admitting: Nurse Practitioner

## 2018-09-20 ENCOUNTER — Encounter (INDEPENDENT_AMBULATORY_CARE_PROVIDER_SITE_OTHER): Payer: Self-pay

## 2018-09-20 VITALS — BP 120/66 | HR 88 | Resp 16 | Ht 69.0 in | Wt 265.0 lb

## 2018-09-20 DIAGNOSIS — I890 Lymphedema, not elsewhere classified: Secondary | ICD-10-CM

## 2018-09-20 DIAGNOSIS — E162 Hypoglycemia, unspecified: Secondary | ICD-10-CM | POA: Diagnosis not present

## 2018-09-20 DIAGNOSIS — R52 Pain, unspecified: Secondary | ICD-10-CM | POA: Diagnosis not present

## 2018-09-20 DIAGNOSIS — E161 Other hypoglycemia: Secondary | ICD-10-CM | POA: Diagnosis not present

## 2018-09-20 NOTE — Progress Notes (Signed)
History of Present Illness  There is no documented history at this time  Assessments & Plan   There are no diagnoses linked to this encounter.    Additional instructions  Subjective:  Patient presents with venous ulcer of the Bilateral lower extremity.    Procedure:  3 layer unna wrap was placed Bilateral lower extremity.   Plan:   Follow up in one week.  

## 2018-09-22 DIAGNOSIS — I10 Essential (primary) hypertension: Secondary | ICD-10-CM | POA: Diagnosis not present

## 2018-09-22 DIAGNOSIS — E785 Hyperlipidemia, unspecified: Secondary | ICD-10-CM | POA: Diagnosis not present

## 2018-09-22 DIAGNOSIS — E1069 Type 1 diabetes mellitus with other specified complication: Secondary | ICD-10-CM | POA: Diagnosis not present

## 2018-09-22 DIAGNOSIS — E10649 Type 1 diabetes mellitus with hypoglycemia without coma: Secondary | ICD-10-CM | POA: Diagnosis not present

## 2018-09-22 DIAGNOSIS — E1159 Type 2 diabetes mellitus with other circulatory complications: Secondary | ICD-10-CM | POA: Diagnosis not present

## 2018-09-27 ENCOUNTER — Encounter (INDEPENDENT_AMBULATORY_CARE_PROVIDER_SITE_OTHER): Payer: Self-pay | Admitting: Nurse Practitioner

## 2018-09-27 ENCOUNTER — Ambulatory Visit (INDEPENDENT_AMBULATORY_CARE_PROVIDER_SITE_OTHER): Payer: Medicare Other | Admitting: Nurse Practitioner

## 2018-09-27 VITALS — BP 136/65 | HR 94 | Resp 18 | Ht 69.0 in | Wt 266.0 lb

## 2018-09-27 DIAGNOSIS — I890 Lymphedema, not elsewhere classified: Secondary | ICD-10-CM | POA: Diagnosis not present

## 2018-09-27 DIAGNOSIS — I10 Essential (primary) hypertension: Secondary | ICD-10-CM

## 2018-09-27 DIAGNOSIS — K219 Gastro-esophageal reflux disease without esophagitis: Secondary | ICD-10-CM

## 2018-09-27 NOTE — Progress Notes (Signed)
Subjective:    [name], male    DOB: August 25, 1946, 72 y.o.   MRN: [id number] Chief Complaint  Patient presents with  . Follow-up    [place] CHECK    HPI  Gary [name] is a 72 y.o. male that is following up to evaluate the progression of the interim therapy.  On last visit Gary [name] also has changes that were consistent with cellulitis.  Today his foot appears much better.  He still has some small ulcerations, mainly on the posterior right calf.  The patient endorses utilizing elevation as much as he possibly can as well to assist with the swelling.  The patient states that he has had no issues with wearing utilizing Unna wrap therapy at this time.  Patient denies any fever, chills, nausea, vomiting or diarrhea.  Patient denies any chest pain or shortness of breath.  Patient denies any claudication-like symptoms or rest pain.  Past Medical History:  Diagnosis Date  . Depression   . 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 Gastroparesis   . Graves disease   . Hypercholesterolemia   . Hypertension   . Mantle cell lymphoma ([place]) 07/2014  . Peripheral neuropathy     Past Surgical History:  Procedure Laterality Date  . CHOLECYSTECTOMY    . NEPHRECTOMY     right  . PACEMAKER INSERTION N/A 12/07/2017   Procedure: INSERTION PACEMAKER DUAL CHAMBER INITIAL INSERT;  Surgeon: Isaias Cowman, MD;  Location: [facility];  Service: Cardiovascular;  Laterality: N/A;  . PORTA CATH INSERTION N/A 11/03/2017   Procedure: PORTA CATH INSERTION;  Surgeon: Katha Cabal, MD;  Location: [facility];  Service: Cardiovascular;  Laterality: N/A;    Social History   Socioeconomic History  . Marital status: Married    Spouse name: Not on file  . Number of children: 1  . Years of education: Not on file  . Highest education level: Not on file  Occupational History  . Not on file  Social Needs  . Financial  resource strain: Not on file  . Food insecurity:    Worry: Not on file    Inability: Not on file  . Transportation needs:    Medical: Not on file    Non-medical: Not on file  Tobacco Use  . Smoking status: Former Research scientist (life sciences)  . Smokeless tobacco: Current User    Types: Chew  Substance and Sexual Activity  . Alcohol use: Yes    Alcohol/week: 3.0 standard drinks    Types: 3 Cans of beer per week    Comment: nightly  . Drug use: No  . Sexual activity: Not Currently  Lifestyle  . Physical activity:    Days per week: Not on file    Minutes per session: Not on file  . Stress: Not on file  Relationships  . Social connections:    Talks on phone: Not on file    Gets together: Not on file    Attends religious service: Not on file    Active member of club or organization: Not on file    Attends meetings of clubs or organizations: Not on file    Relationship status: Not on file  . Intimate partner violence:    Fear of current or ex partner: Not on file    Emotionally abused: Not on file    Physically abused: Not on file    Forced sexual activity:  Not on file  Other Topics Concern  . Not on file  Social History Narrative  . Not on file    Family History  Problem Relation Age of Onset  . Breast cancer Mother   . Diabetes Father   . Cancer Father        Lymphoma  . Alcohol abuse Maternal Uncle   . Diabetes Sister   . Heart disease Unknown        grandfather    Allergies  Allergen Reactions  . Rofecoxib Nausea Only     Review of Systems   Review of Systems: Negative Unless Checked Constitutional: [] Weight loss  [] Fever  [] Chills Cardiac: [] Chest pain   []  Atrial Fibrillation  [] Palpitations   [] Shortness of breath when laying flat   [] Shortness of breath with exertion. Vascular:  [] Pain in legs with walking   [] Pain in legs with standing  [] History of DVT   [] Phlebitis   [] Swelling in legs   [] Varicose veins   [] Non-healing ulcers Pulmonary:   [] Uses home oxygen    [] Productive cough   [] Hemoptysis   [] Wheeze  [] COPD   [] Asthma Neurologic:  [] Dizziness   [] Seizures   [] History of stroke   [] History of TIA  [] Aphasia   [] Vissual changes   [] Weakness or numbness in arm   [x] Weakness or numbness in leg Musculoskeletal:   [x] Joint swelling   [x] Joint pain   [] Low back pain  []  History of Knee Replacement Hematologic:  [] Easy bruising  [] Easy bleeding   [] Hypercoagulable state   [] Anemic Gastrointestinal:  [] Diarrhea   [] Vomiting  [x] Gastroesophageal reflux/heartburn   [] Difficulty swallowing. Genitourinary:  [x] Chronic kidney disease   [] Difficult urination  [] Anuric   [] Blood in urine Skin:  [] Rashes   [x] Ulcers  Psychological:  [] History of anxiety   []  History of major depression  []  Memory Difficulties     Objective:   Physical Exam  BP 136/65 (BP Location: Right Arm, Patient Position: Sitting)   Pulse 94   Resp 18   Ht 5\' 9"  (1.753 m)   Wt 266 lb (120.7 kg)   BMI 39.28 kg/m   Gen: WD/WN, NAD Head: [place]/AT, No temporalis wasting.  Ear/Nose/Throat: Hearing grossly intact, nares w/o erythema or drainage Eyes: PER, EOMI, sclera nonicteric.  Neck: Supple, no masses.  No JVD.  Pulmonary:  Good air movement, no use of accessory muscles.  Cardiac: RRR Vascular:  Scattered ulcerations on right posterior calf Vessel Right Left  Radial Palpable Palpable  Dorsalis Pedis Palpable Palpable  Posterior Tibial Palpable Palpable   Gastrointestinal: soft, non-distended. No guarding/no peritoneal signs.  Musculoskeletal: Uses cane for ambulation.  No deformity or atrophy.  Neurologic: Pain and light touch intact in extremities.  Symmetrical.  Speech is fluent. Motor exam as listed above. Psychiatric: Judgment intact, Mood & affect appropriate for pt's clinical situation. Dermatologic:   No changes consistent with cellulitis. Lymph : No Cervical lymphadenopathy, no lichenification or skin changes of chronic lymphedema.      Assessment & Plan:   1.  Lymphedema No surgery or intervention at this point in time.    I have had a long discussion with the patient regarding venous insufficiency and why it  causes symptoms, specifically venous ulceration . I have discussed with the patient the chronic skin changes that accompany venous insufficiency and the long term sequela such as infection and recurring  ulceration.  Patient will be placed in [employer] which will be changed weekly drainage permitting.  In addition, behavioral modification  including several periods of elevation of the lower extremities during the day will be continued. Achieving a position with the ankles at heart level was stressed to the patient  The patient is instructed to begin routine exercise, especially walking on a daily basis  The patient will be placed in a wraps for 4 weeks or we will then reevaluate to see the progression of his wound status as well as if he can transition into medical grade 1 compression socks.   2. Gastroesophageal reflux disease, esophagitis presence not specified Continue PPI as already ordered, this medication has been reviewed and there are no changes at this time.  Avoidence of caffeine and alcohol  Moderate elevation of the head of the bed   3. Essential hypertension, benign Continue antihypertensive medications as already ordered, these medications have been reviewed and there are no changes at this time.    Current Outpatient Medications on File Prior to Visit  Medication Sig Dispense Refill  . ACCU-CHEK AVIVA PLUS test strip USE AS INSTRUCTED. USE 1 STRIP EVERY 3 HOURS . ACCU-CHECK AVIVA PLUS TEST STRIP. DX E10.649  12  . carvedilol (COREG) 3.125 MG tablet Take 3.125 mg by mouth 2 (two) times daily with a meal.     . cyanocobalamin (,VITAMIN B-12,) 1000 MCG/ML injection Inject into the muscle.    . ferrous sulfate 325 (65 FE) MG tablet Take 325 mg by mouth daily.    . furosemide (LASIX) 20 MG tablet Take 1 tablet (20 mg total) by  mouth daily. 30 tablet 1  . GLUCAGON EMERGENCY 1 MG injection     . glucagon, human recombinant, (GLUCAGEN DIAGNOSTIC) 1 MG injection Use as directed    . glucose 4 GM chewable tablet Chew 1 tablet by mouth once as needed for low blood sugar.    Marland Kitchen glucose blood (PRECISION QID TEST) test strip Use 8 (eight) times daily as directed. BAYER CONTOUR NEXT TEST STRIPS E10.649    . insulin aspart (NOVOLOG) 100 UNIT/ML injection Basal rate 12am 0.8 units per hour, 9am 0.9 units per hour. (total basal insulin 20.7 units). Carbohydrate ratio 1 units for every 8gm of carbohydrate. Correction factor 1 units for every 50mg /dl over target cbg. Target CBG 80-120 1 vial 12  . levothyroxine (SYNTHROID, LEVOTHROID) 25 MCG tablet Take 25 mcg by mouth daily.    Marland Kitchen lisinopril (PRINIVIL,ZESTRIL) 20 MG tablet Take 20 mg by mouth daily.    . Multiple Vitamin (MULTIVITAMIN) tablet Take 1 tablet by mouth daily.    . pravastatin (PRAVACHOL) 40 MG tablet Take 1 tablet (40 mg total) by mouth at bedtime. Will need office visit for any more refills 30 tablet 0  . prochlorperazine (COMPAZINE) 10 MG tablet TAKE 1 TABLET (10 MG TOTAL) BY MOUTH EVERY 6 (SIX) HOURS AS NEEDED (NAUSEA OR VOMITING).    Marland Kitchen spironolactone (ALDACTONE) 25 MG tablet Take 25 mg by mouth daily.     Current Facility-Administered Medications on File Prior to Visit  Medication Dose Route Frequency Provider Last Rate Last Dose  . heparin lock flush 100 unit/mL  500 Units Intravenous Once Lloyd Huger, MD      . sodium chloride flush (NS) 0.9 % injection 10 mL  10 mL Intravenous PRN Lloyd Huger, MD   10 mL at 02/01/18 0829  . sodium chloride flush (NS) 0.9 % injection 10 mL  10 mL Intravenous PRN Lloyd Huger, MD   10 mL at 04/05/18 0800    There are no Patient  Instructions on file for this visit. No follow-ups on file.   Kris Hartmann, NP  This note was completed with Sales executive.  Any errors are purely unintentional.

## 2018-09-29 ENCOUNTER — Inpatient Hospital Stay: Payer: Medicare Other | Attending: Oncology

## 2018-09-29 DIAGNOSIS — C8318 Mantle cell lymphoma, lymph nodes of multiple sites: Secondary | ICD-10-CM | POA: Diagnosis not present

## 2018-09-29 DIAGNOSIS — Z452 Encounter for adjustment and management of vascular access device: Secondary | ICD-10-CM | POA: Diagnosis not present

## 2018-09-29 DIAGNOSIS — Z9221 Personal history of antineoplastic chemotherapy: Secondary | ICD-10-CM | POA: Insufficient documentation

## 2018-09-29 LAB — CBC WITH DIFFERENTIAL/PLATELET
Abs Immature Granulocytes: 0 10*3/uL (ref 0.00–0.07)
BASOS PCT: 2 %
Basophils Absolute: 0 10*3/uL (ref 0.0–0.1)
EOS ABS: 0.2 10*3/uL (ref 0.0–0.5)
Eosinophils Relative: 9 %
HCT: 32.3 % — ABNORMAL LOW (ref 39.0–52.0)
Hemoglobin: 10.8 g/dL — ABNORMAL LOW (ref 13.0–17.0)
Lymphocytes Relative: 34 %
Lymphs Abs: 0.8 10*3/uL (ref 0.7–4.0)
MCH: 30.9 pg (ref 26.0–34.0)
MCHC: 33.4 g/dL (ref 30.0–36.0)
MCV: 92.6 fL (ref 80.0–100.0)
Metamyelocytes Relative: 1 %
Monocytes Absolute: 0.4 10*3/uL (ref 0.1–1.0)
Monocytes Relative: 15 %
NEUTROS ABS: 0.9 10*3/uL — AB (ref 1.7–7.7)
NEUTROS PCT: 39 %
Platelets: 128 10*3/uL — ABNORMAL LOW (ref 150–400)
RBC: 3.49 MIL/uL — AB (ref 4.22–5.81)
RDW: 13.9 % (ref 11.5–15.5)
WBC: 2.4 10*3/uL — AB (ref 4.0–10.5)
nRBC: 0 % (ref 0.0–0.2)

## 2018-09-29 LAB — COMPREHENSIVE METABOLIC PANEL
ALT: 17 U/L (ref 0–44)
ANION GAP: 7 (ref 5–15)
AST: 29 U/L (ref 15–41)
Albumin: 3.4 g/dL — ABNORMAL LOW (ref 3.5–5.0)
Alkaline Phosphatase: 138 U/L — ABNORMAL HIGH (ref 38–126)
BILIRUBIN TOTAL: 0.7 mg/dL (ref 0.3–1.2)
BUN: 22 mg/dL (ref 8–23)
CO2: 21 mmol/L — ABNORMAL LOW (ref 22–32)
Calcium: 8.1 mg/dL — ABNORMAL LOW (ref 8.9–10.3)
Chloride: 113 mmol/L — ABNORMAL HIGH (ref 98–111)
Creatinine, Ser: 1.37 mg/dL — ABNORMAL HIGH (ref 0.61–1.24)
GFR calc Af Amer: >60 mL/min (ref >60)
GFR, EST NON AFRICAN AMERICAN: 52 mL/min — AB (ref >60)
Glucose, Bld: 93 mg/dL (ref 70–99)
POTASSIUM: 3.9 mmol/L (ref 3.5–5.1)
Sodium: 141 mmol/L (ref 135–145)
TOTAL PROTEIN: 6.3 g/dL — AB (ref 6.5–8.1)

## 2018-09-29 MED ORDER — HEPARIN SOD (PORK) LOCK FLUSH 100 UNIT/ML IV SOLN
500.0000 [IU] | Freq: Once | INTRAVENOUS | Status: AC
  Administered 2018-09-29: 500 [IU] via INTRAVENOUS

## 2018-09-29 MED ORDER — HEPARIN SOD (PORK) LOCK FLUSH 100 UNIT/ML IV SOLN
INTRAVENOUS | Status: AC
  Filled 2018-09-29: qty 5

## 2018-09-29 MED ORDER — SODIUM CHLORIDE 0.9% FLUSH
10.0000 mL | Freq: Once | INTRAVENOUS | Status: AC
  Administered 2018-09-29: 10 mL via INTRAVENOUS
  Filled 2018-09-29: qty 10

## 2018-10-03 ENCOUNTER — Encounter: Payer: Self-pay | Admitting: Podiatry

## 2018-10-03 ENCOUNTER — Ambulatory Visit (INDEPENDENT_AMBULATORY_CARE_PROVIDER_SITE_OTHER): Payer: Medicare Other | Admitting: Podiatry

## 2018-10-03 DIAGNOSIS — M79675 Pain in left toe(s): Secondary | ICD-10-CM

## 2018-10-03 DIAGNOSIS — M79674 Pain in right toe(s): Secondary | ICD-10-CM | POA: Diagnosis not present

## 2018-10-03 DIAGNOSIS — E1142 Type 2 diabetes mellitus with diabetic polyneuropathy: Secondary | ICD-10-CM

## 2018-10-03 DIAGNOSIS — B351 Tinea unguium: Secondary | ICD-10-CM | POA: Diagnosis not present

## 2018-10-03 NOTE — Progress Notes (Signed)
Complaint:  Visit Type: Patient returns to my office for continued preventative foot care services. Complaint: Patient states" my nails have grown long and thick and become painful to walk and wear shoes" Patient has been diagnosed with DM with no foot complications. Patient is wearing unna boots on both legs and feet. He says these are changed weekly and is due to have them changed Tuesday.  Patient nails and toes are dirty since he is unable to bathe because of the unna boots  B/L.  The patient presents for preventative foot care services. No changes to ROS  Podiatric Exam: Vascular: deferred this visit.  Sensorium: Deferred this visit. Nail Exam: Pt has thick disfigured discolored nails with subungual debris noted bilateral entire nail hallux through fifth toenails Ulcer Exam: There is no evidence of ulcer or pre-ulcerative changes or infection. Orthopedic Exam: Muscle tone and strength are WNL. No limitations in general ROM. No crepitus or effusions noted. Foot type and digits show no abnormalities. Bony prominences are unremarkable. Skin: No Porokeratosis. No infection or ulcers  Diagnosis:  Onychomycosis, , Pain in right toe, pain in left toes  Treatment & Plan Procedures and Treatment: Consent by patient was obtained for treatment procedures.   Debridement of mycotic and hypertrophic toenails, 1 through 5 bilateral and clearing of subungual debris. No ulceration, no infection noted. ABN signed for 2019. Return Visit-Office Procedure: Patient instructed to return to the office for a follow up visit 3 months for continued evaluation and treatment.    [name] DPM

## 2018-10-06 ENCOUNTER — Ambulatory Visit (INDEPENDENT_AMBULATORY_CARE_PROVIDER_SITE_OTHER): Payer: Medicare Other | Admitting: Nurse Practitioner

## 2018-10-06 ENCOUNTER — Encounter (INDEPENDENT_AMBULATORY_CARE_PROVIDER_SITE_OTHER): Payer: Self-pay

## 2018-10-06 VITALS — BP 130/59 | HR 84 | Resp 14 | Ht 69.0 in | Wt 269.4 lb

## 2018-10-06 DIAGNOSIS — I890 Lymphedema, not elsewhere classified: Secondary | ICD-10-CM

## 2018-10-06 NOTE — Progress Notes (Signed)
History of Present Illness  There is no documented history at this time  Assessments & Plan   There are no diagnoses linked to this encounter.    Additional instructions  Subjective:  Patient presents with venous ulcer of the Bilateral lower extremity.    Procedure:  3 layer unna wrap was placed Bilateral lower extremity.   Plan:   Follow up in one week.  

## 2018-10-11 ENCOUNTER — Encounter (INDEPENDENT_AMBULATORY_CARE_PROVIDER_SITE_OTHER): Payer: Self-pay

## 2018-10-11 ENCOUNTER — Ambulatory Visit (INDEPENDENT_AMBULATORY_CARE_PROVIDER_SITE_OTHER): Payer: Medicare Other | Admitting: Nurse Practitioner

## 2018-10-11 ENCOUNTER — Other Ambulatory Visit: Payer: Self-pay

## 2018-10-11 VITALS — BP 130/65 | HR 91 | Wt 269.0 lb

## 2018-10-11 DIAGNOSIS — I890 Lymphedema, not elsewhere classified: Secondary | ICD-10-CM

## 2018-10-11 NOTE — Progress Notes (Signed)
History of Present Illness  There is no documented history at this time  Assessments & Plan   There are no diagnoses linked to this encounter.    Additional instructions  Subjective:  Patient presents with venous ulcer of the Bilateral lower extremity.    Procedure:  3 layer unna wrap was placed Bilateral lower extremity.   Plan:   Follow up in one week.  

## 2018-10-18 ENCOUNTER — Encounter (INDEPENDENT_AMBULATORY_CARE_PROVIDER_SITE_OTHER): Payer: Self-pay | Admitting: Nurse Practitioner

## 2018-10-18 ENCOUNTER — Ambulatory Visit (INDEPENDENT_AMBULATORY_CARE_PROVIDER_SITE_OTHER): Payer: Medicare Other | Admitting: Nurse Practitioner

## 2018-10-18 VITALS — BP 138/72 | HR 87 | Resp 18 | Ht 69.0 in | Wt 270.6 lb

## 2018-10-18 DIAGNOSIS — I10 Essential (primary) hypertension: Secondary | ICD-10-CM

## 2018-10-18 DIAGNOSIS — I5022 Chronic systolic (congestive) heart failure: Secondary | ICD-10-CM | POA: Diagnosis not present

## 2018-10-18 DIAGNOSIS — I890 Lymphedema, not elsewhere classified: Secondary | ICD-10-CM

## 2018-10-18 DIAGNOSIS — K219 Gastro-esophageal reflux disease without esophagitis: Secondary | ICD-10-CM

## 2018-10-18 DIAGNOSIS — L03115 Cellulitis of right lower limb: Secondary | ICD-10-CM | POA: Diagnosis not present

## 2018-10-18 MED ORDER — DOXYCYCLINE HYCLATE 100 MG PO CAPS: 100.0000 mg | ORAL_CAPSULE | Freq: Two times a day (BID) | ORAL | 0 refills | Status: DC

## 2018-10-18 NOTE — Progress Notes (Signed)
Subjective:    [name], male    DOB: 10/03/1946, 73 y.o.   MRN: [id number] Chief Complaint  Patient presents with  . Follow-up    HPI  Gary [name] is a 73 y.o. male that presents for assessment of his lower extremities after Unna wrap therapy.  Today the patient has fluid-filled blisters on his right lower extremity, he is also weeping from his left lower extremity.  The leg is also extremely erythematous consistent with signs and symptoms of cellulitis.  The patient describes it as being somewhat sore.  The patient states that he has been wearing his Unna wraps until yesterday evening where he took off his wraps to take a shower.  He denies any fever, chills, nausea, vomiting or diarrhea.  He denies any chest pain or shortness of breath.  He denies any TIA-like symptoms or amaurosis fugax.  Past Medical History:  Diagnosis Date  . Depression   . 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 Gastroparesis   . Graves disease   . Hypercholesterolemia   . Hypertension   . Mantle cell lymphoma ([place]) 07/2014  . Peripheral neuropathy     Past Surgical History:  Procedure Laterality Date  . CHOLECYSTECTOMY    . NEPHRECTOMY     right  . PACEMAKER INSERTION N/A 12/07/2017   Procedure: INSERTION PACEMAKER DUAL CHAMBER INITIAL INSERT;  Surgeon: Isaias Cowman, MD;  Location: [facility];  Service: Cardiovascular;  Laterality: N/A;  . PORTA CATH INSERTION N/A 11/03/2017   Procedure: PORTA CATH INSERTION;  Surgeon: Katha Cabal, MD;  Location: [facility];  Service: Cardiovascular;  Laterality: N/A;    Social History   Socioeconomic History  . Marital status: Married    Spouse name: Not on file  . Number of children: 1  . Years of education: Not on file  . Highest education level: Not on file  Occupational History  . Not on file  Social Needs  . Financial resource strain: Not on file  . Food  insecurity:    Worry: Not on file    Inability: Not on file  . Transportation needs:    Medical: Not on file    Non-medical: Not on file  Tobacco Use  . Smoking status: Former Research scientist (life sciences)  . Smokeless tobacco: Current User    Types: Chew  Substance and Sexual Activity  . Alcohol use: Yes    Alcohol/week: 3.0 standard drinks    Types: 3 Cans of beer per week    Comment: nightly  . Drug use: No  . Sexual activity: Not Currently  Lifestyle  . Physical activity:    Days per week: Not on file    Minutes per session: Not on file  . Stress: Not on file  Relationships  . Social connections:    Talks on phone: Not on file    Gets together: Not on file    Attends religious service: Not on file    Active member of club or organization: Not on file    Attends meetings of clubs or organizations: Not on file    Relationship status: Not on file  . Intimate partner violence:    Fear of current or ex partner: Not on file    Emotionally abused: Not on file    Physically abused: Not on file    Forced sexual activity: Not on file  Other Topics Concern  . Not on  file  Social History Narrative  . Not on file    Family History  Problem Relation Age of Onset  . Breast cancer Mother   . Diabetes Father   . Cancer Father        Lymphoma  . Alcohol abuse Maternal Uncle   . Diabetes Sister   . Heart disease Unknown        grandfather    Allergies  Allergen Reactions  . Rofecoxib Nausea Only     Review of Systems   Review of Systems: Negative Unless Checked Constitutional: [] Weight loss  [] Fever  [] Chills Cardiac: [] Chest pain   []  Atrial Fibrillation  [] Palpitations   [] Shortness of breath when laying flat   [] Shortness of breath with exertion. [] Shortness of breath at rest Vascular:  [] Pain in legs with walking   [] Pain in legs with standing [] Pain in legs when laying flat   [] Claudication    [] Pain in feet when laying flat    [] History of DVT   [] Phlebitis   [x] Swelling in legs    [] Varicose veins   [] Non-healing ulcers Pulmonary:   [] Uses home oxygen   [] Productive cough   [] Hemoptysis   [] Wheeze  [] COPD   [] Asthma Neurologic:  [] Dizziness   [] Seizures  [] Blackouts [] History of stroke   [] History of TIA  [] Aphasia   [] Temporary Blindness   [] Weakness or numbness in arm   [] Weakness or numbness in leg Musculoskeletal:   [] Joint swelling   [] Joint pain   [] Low back pain  []  History of Knee Replacement [] Arthritis [] back Surgeries  []  Spinal Stenosis    Hematologic:  [] Easy bruising  [] Easy bleeding   [] Hypercoagulable state   [x] Anemic Gastrointestinal:  [] Diarrhea   [] Vomiting  [x] Gastroesophageal reflux/heartburn   [] Difficulty swallowing. [] Abdominal pain Genitourinary:  [] Chronic kidney disease   [] Difficult urination  [] Anuric   [] Blood in urine [] Frequent urination  [] Burning with urination   [] Hematuria Skin:  [] Rashes   [] Ulcers [x] Wounds Psychological:  [] History of anxiety   [x]  History of major depression  []  Memory Difficulties     Objective:   Physical Exam  BP 138/72 (BP Location: Right Arm, Patient Position: Sitting, Cuff Size: Large)   Pulse 87   Resp 18   Ht 5\' 9"  (1.753 m)   Wt 270 lb 9.6 oz (122.7 kg)   BMI 39.96 kg/m   Gen: WD/WN, NAD Head: [place]/AT, No temporalis wasting.  Ear/Nose/Throat: Hearing grossly intact, nares w/o erythema or drainage Eyes: PER, EOMI, sclera nonicteric.  Neck: Supple, no masses.  No JVD.  Pulmonary:  Good air movement, no use of accessory muscles.  Cardiac: RRR Vascular:  Vessel Right Left  Radial Palpable Palpable  Dorsalis Pedis Palpable Palpable  Posterior Tibial Palpable Palpable   Gastrointestinal: soft, non-distended. No guarding/no peritoneal signs.  Musculoskeletal: M/S 5/5 throughout.  No deformity or atrophy.  Neurologic: Pain and light touch intact in extremities.  Symmetrical.  Speech is fluent. Motor exam as listed above. Psychiatric: Judgment intact, Mood & affect appropriate for pt's clinical  situation. Dermatologic: Changes consistent with cellulitis right lower extremity.  Several fluid-filled blisters.  Erythematous Lymph : No Cervical lymphadenopathy, no lichenification or skin changes of chronic lymphedema.      Assessment & Plan:   1. Lymphedema The patient continues to have lower extremity swelling despite Unna wrap usage.  The recent blister formation and weeping of the right lower extremity has been concerned that he may not be wearing the wraps for the week as prescribed.  I have reinforced this with him.  I have also reinforced that he should be doing elevation when possible in order to assist with edema.  2. Cellulitis of right lower extremity We will have the patient complete a 14-day course of doxycycline.  He has utilizes any well.  We will evaluate progress at his next interim check.  - doxycycline (VIBRAMYCIN) 100 MG capsule; Take 1 capsule (100 mg total) by mouth 2 (two) times daily.  Dispense: 28 capsule; Refill: 0  3. Gastroesophageal reflux disease, esophagitis presence not specified Continue PPI as already ordered, this medication has been reviewed and there are no changes at this time.  Avoidence of caffeine and alcohol  Moderate elevation of the head of the bed   4. Essential hypertension, benign Continue antihypertensive medications as already ordered, these medications have been reviewed and there are no changes at this time.    Current Outpatient Medications on File Prior to Visit  Medication Sig Dispense Refill  . ACCU-CHEK AVIVA PLUS test strip USE AS INSTRUCTED. USE 1 STRIP EVERY 3 HOURS . ACCU-CHECK AVIVA PLUS TEST STRIP. DX E10.649  12  . cyanocobalamin (,VITAMIN B-12,) 1000 MCG/ML injection Inject into the muscle.    . ferrous sulfate 325 (65 FE) MG tablet Take 325 mg by mouth daily.    Marland Kitchen GLUCAGON EMERGENCY 1 MG injection     . glucagon, human recombinant, (GLUCAGEN DIAGNOSTIC) 1 MG injection Use as directed    . glucose 4 GM chewable  tablet Chew 1 tablet by mouth once as needed for low blood sugar.    Marland Kitchen glucose blood (PRECISION QID TEST) test strip Use 8 (eight) times daily as directed. BAYER CONTOUR NEXT TEST STRIPS E10.649    . insulin aspart (NOVOLOG) 100 UNIT/ML injection Basal rate 12am 0.8 units per hour, 9am 0.9 units per hour. (total basal insulin 20.7 units). Carbohydrate ratio 1 units for every 8gm of carbohydrate. Correction factor 1 units for every 50mg /dl over target cbg. Target CBG 80-120 1 vial 12  . levothyroxine (SYNTHROID, LEVOTHROID) 25 MCG tablet Take 25 mcg by mouth daily.    Marland Kitchen lisinopril (PRINIVIL,ZESTRIL) 20 MG tablet Take 20 mg by mouth daily.    . Multiple Vitamin (MULTIVITAMIN) tablet Take 1 tablet by mouth daily.    . pravastatin (PRAVACHOL) 40 MG tablet Take 1 tablet (40 mg total) by mouth at bedtime. Will need office visit for any more refills 30 tablet 0  . prochlorperazine (COMPAZINE) 10 MG tablet TAKE 1 TABLET (10 MG TOTAL) BY MOUTH EVERY 6 (SIX) HOURS AS NEEDED (NAUSEA OR VOMITING).    Marland Kitchen spironolactone (ALDACTONE) 25 MG tablet Take 25 mg by mouth daily.    . carvedilol (COREG) 3.125 MG tablet Take 3.125 mg by mouth 2 (two) times daily with a meal.     . furosemide (LASIX) 20 MG tablet Take 1 tablet (20 mg total) by mouth daily. 30 tablet 1   Current Facility-Administered Medications on File Prior to Visit  Medication Dose Route Frequency Provider Last Rate Last Dose  . heparin lock flush 100 unit/mL  500 Units Intravenous Once Lloyd Huger, MD      . sodium chloride flush (NS) 0.9 % injection 10 mL  10 mL Intravenous PRN Lloyd Huger, MD   10 mL at 02/01/18 0829  . sodium chloride flush (NS) 0.9 % injection 10 mL  10 mL Intravenous PRN Lloyd Huger, MD   10 mL at 04/05/18 0800    There are no  Patient Instructions on file for this visit. No follow-ups on file.   Kris Hartmann, NP  This note was completed with Sales executive.  Any errors are purely unintentional.

## 2018-10-24 ENCOUNTER — Ambulatory Visit: Payer: Medicare Other

## 2018-10-25 ENCOUNTER — Ambulatory Visit (INDEPENDENT_AMBULATORY_CARE_PROVIDER_SITE_OTHER): Payer: Medicare Other | Admitting: Nurse Practitioner

## 2018-10-25 ENCOUNTER — Encounter (INDEPENDENT_AMBULATORY_CARE_PROVIDER_SITE_OTHER): Payer: Self-pay

## 2018-10-25 VITALS — BP 122/70 | HR 82 | Resp 16 | Ht 69.0 in | Wt 271.6 lb

## 2018-10-25 DIAGNOSIS — I890 Lymphedema, not elsewhere classified: Secondary | ICD-10-CM | POA: Diagnosis not present

## 2018-10-25 NOTE — Progress Notes (Signed)
History of Present Illness  There is no documented history at this time  Assessments & Plan   There are no diagnoses linked to this encounter.    Additional instructions  Subjective:  Patient presents with venous ulcer of the Bilateral lower extremity.    Procedure:  3 layer unna wrap was placed Bilateral lower extremity.   Plan:   Follow up in one week.  

## 2018-10-31 DIAGNOSIS — S46912A Strain of unspecified muscle, fascia and tendon at shoulder and upper arm level, left arm, initial encounter: Secondary | ICD-10-CM | POA: Diagnosis not present

## 2018-11-01 ENCOUNTER — Telehealth (INDEPENDENT_AMBULATORY_CARE_PROVIDER_SITE_OTHER): Payer: Self-pay | Admitting: Nurse Practitioner

## 2018-11-01 ENCOUNTER — Encounter (INDEPENDENT_AMBULATORY_CARE_PROVIDER_SITE_OTHER): Payer: Self-pay

## 2018-11-01 ENCOUNTER — Ambulatory Visit (INDEPENDENT_AMBULATORY_CARE_PROVIDER_SITE_OTHER): Payer: Medicare Other | Admitting: Vascular Surgery

## 2018-11-01 VITALS — BP 130/69 | HR 76 | Resp 18

## 2018-11-01 DIAGNOSIS — L97929 Non-pressure chronic ulcer of unspecified part of left lower leg with unspecified severity: Secondary | ICD-10-CM | POA: Diagnosis not present

## 2018-11-01 DIAGNOSIS — L97919 Non-pressure chronic ulcer of unspecified part of right lower leg with unspecified severity: Secondary | ICD-10-CM | POA: Diagnosis not present

## 2018-11-01 DIAGNOSIS — R600 Localized edema: Secondary | ICD-10-CM

## 2018-11-01 NOTE — Progress Notes (Signed)
History of Present Illness  There is no documented history at this time  Assessments & Plan   There are no diagnoses linked to this encounter.    Additional instructions  Subjective:  Patient presents with venous ulcer of the Bilateral lower extremity.    Procedure:  3 layer unna wrap was placed Bilateral lower extremity.   Plan:   Follow up in one week.  

## 2018-11-01 NOTE — Telephone Encounter (Signed)
Pt came into office today for unna wraps. Per pt he is finishing his doxycycline prescribed by Arna Medici and does feel that he has some improvement since taken the medication. He wanted to know what would be his next steps once finishing the medication. Would he be placed on another round? Pt does have a f/u appt on 11/08/18

## 2018-11-01 NOTE — Telephone Encounter (Signed)
We will see how his legs appear at this next visit to determine if needs more antibiotics

## 2018-11-01 NOTE — Telephone Encounter (Signed)
Spoke with pt and made him aware of Fallon's message. Pt understood and had no additional questions at this time.

## 2018-11-08 ENCOUNTER — Ambulatory Visit (INDEPENDENT_AMBULATORY_CARE_PROVIDER_SITE_OTHER): Payer: Medicare Other | Admitting: Nurse Practitioner

## 2018-11-08 ENCOUNTER — Telehealth (INDEPENDENT_AMBULATORY_CARE_PROVIDER_SITE_OTHER): Payer: Self-pay

## 2018-11-08 ENCOUNTER — Encounter (INDEPENDENT_AMBULATORY_CARE_PROVIDER_SITE_OTHER): Payer: Self-pay | Admitting: Nurse Practitioner

## 2018-11-08 VITALS — BP 120/63 | HR 85 | Resp 18 | Ht 69.0 in | Wt 268.2 lb

## 2018-11-08 DIAGNOSIS — F17220 Nicotine dependence, chewing tobacco, uncomplicated: Secondary | ICD-10-CM

## 2018-11-08 DIAGNOSIS — I10 Essential (primary) hypertension: Secondary | ICD-10-CM | POA: Diagnosis not present

## 2018-11-08 DIAGNOSIS — K219 Gastro-esophageal reflux disease without esophagitis: Secondary | ICD-10-CM

## 2018-11-08 DIAGNOSIS — I890 Lymphedema, not elsewhere classified: Secondary | ICD-10-CM | POA: Diagnosis not present

## 2018-11-08 MED ORDER — SULFAMETHOXAZOLE-TRIMETHOPRIM 800-160 MG PO TABS: 1.0000 | ORAL_TABLET | Freq: Two times a day (BID) | ORAL | 0 refills | Status: AC

## 2018-11-08 NOTE — Progress Notes (Signed)
Subjective:    [name], male    DOB: 1946/09/09, 73 y.o.   MRN: [id number] Chief Complaint  Patient presents with  . Follow-up    HPI  Gary [name] is a 73 y.o. male that presents today for follow-up with lymphedema following Unna wrap therapy.  Despite using Unna wraps the swelling is not very well controlled.  The patient reports having a burning irritation of his lower extremities.  They are also very erythematous.  He denies any fever, chills, nausea, vomiting or diarrhea.  He denies any chest pain or shortness of breath.  Past Medical History:  Diagnosis Date  . Depression   . 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 Gastroparesis   . Graves disease   . Hypercholesterolemia   . Hypertension   . Mantle cell lymphoma ([place]) 07/2014  . Peripheral neuropathy     Past Surgical History:  Procedure Laterality Date  . CHOLECYSTECTOMY    . NEPHRECTOMY     right  . PACEMAKER INSERTION N/A 12/07/2017   Procedure: INSERTION PACEMAKER DUAL CHAMBER INITIAL INSERT;  Surgeon: Isaias Cowman, MD;  Location: [facility];  Service: Cardiovascular;  Laterality: N/A;  . PORTA CATH INSERTION N/A 11/03/2017   Procedure: PORTA CATH INSERTION;  Surgeon: Katha Cabal, MD;  Location: [place] [facility];  Service: Cardiovascular;  Laterality: N/A;    Social History   Socioeconomic History  . Marital status: Married    Spouse name: Not on file  . Number of children: 1  . Years of education: Not on file  . Highest education level: Not on file  Occupational History  . Not on file  Social Needs  . Financial resource strain: Not on file  . Food insecurity:    Worry: Not on file    Inability: Not on file  . Transportation needs:    Medical: Not on file    Non-medical: Not on file  Tobacco Use  . Smoking status: Former Research scientist (life sciences)  . Smokeless tobacco: Current User    Types: Chew  Substance and Sexual Activity  .  Alcohol use: Yes    Alcohol/week: 3.0 standard drinks    Types: 3 Cans of beer per week    Comment: nightly  . Drug use: No  . Sexual activity: Not Currently  Lifestyle  . Physical activity:    Days per week: Not on file    Minutes per session: Not on file  . Stress: Not on file  Relationships  . Social connections:    Talks on phone: Not on file    Gets together: Not on file    Attends religious service: Not on file    Active member of club or organization: Not on file    Attends meetings of clubs or organizations: Not on file    Relationship status: Not on file  . Intimate partner violence:    Fear of current or ex partner: Not on file    Emotionally abused: Not on file    Physically abused: Not on file    Forced sexual activity: Not on file  Other Topics Concern  . Not on file  Social History Narrative  . Not on file    Family History  Problem Relation Age of Onset  . Breast cancer Mother   . Diabetes Father   . Cancer Father        Lymphoma  . Alcohol abuse Maternal  Uncle   . Diabetes Sister   . Heart disease Unknown        grandfather    Allergies  Allergen Reactions  . Rofecoxib Nausea Only     Review of Systems   Review of Systems: Negative Unless Checked Constitutional: [] Weight loss  [] Fever  [] Chills Cardiac: [] Chest pain   []  Atrial Fibrillation  [] Palpitations   [] Shortness of breath when laying flat   [] Shortness of breath with exertion. [] Shortness of breath at rest Vascular:  [] Pain in legs with walking   [] Pain in legs with standing [] Pain in legs when laying flat   [] Claudication    [] Pain in feet when laying flat    [] History of DVT   [] Phlebitis   [x] Swelling in legs   [] Varicose veins   [] Non-healing ulcers Pulmonary:   [] Uses home oxygen   [] Productive cough   [] Hemoptysis   [] Wheeze  [] COPD   [] Asthma Neurologic:  [] Dizziness   [] Seizures  [] Blackouts [] History of stroke   [] History of TIA  [] Aphasia   [] Temporary Blindness   [] Weakness or  numbness in arm   [x] Weakness or numbness in leg Musculoskeletal:   [] Joint swelling   [] Joint pain   [] Low back pain  []  History of Knee Replacement [] Arthritis [] back Surgeries  []  Spinal Stenosis    Hematologic:  [] Easy bruising  [] Easy bleeding   [] Hypercoagulable state   [] Anemic Gastrointestinal:  [] Diarrhea   [] Vomiting  [] Gastroesophageal reflux/heartburn   [] Difficulty swallowing. [] Abdominal pain Genitourinary:  [] Chronic kidney disease   [] Difficult urination  [] Anuric   [] Blood in urine [] Frequent urination  [] Burning with urination   [] Hematuria Skin:  [x] Rashes   [x] Ulcers [] Wounds Psychological:  [] History of anxiety   []  History of major depression  []  Memory Difficulties     Objective:   Physical Exam  BP 120/63 (BP Location: Right Arm, Patient Position: Sitting, Cuff Size: Large)   Pulse 85   Resp 18   Ht 5\' 9"  (1.753 m)   Wt 268 lb 3.2 oz (121.7 kg)   BMI 39.61 kg/m   Gen: WD/WN, NAD Head: [place]/AT, No temporalis wasting.  Ear/Nose/Throat: Hearing grossly intact, nares w/o erythema or drainage Eyes: PER, EOMI, sclera nonicteric.  Neck: Supple, no masses.  No JVD.  Pulmonary:  Good air movement, no use of accessory muscles.  Cardiac: RRR Vascular:  Bilateral 3+ pitting edema Vessel Right Left  Radial Palpable Palpable  Dorsalis Pedis Palpable Palpable  Posterior Tibial Palpable Palpable   Gastrointestinal: soft, non-distended. No guarding/no peritoneal signs.  Musculoskeletal: M/S 5/5 throughout.  No deformity or atrophy.  Neurologic: Pain and light touch intact in extremities.  Symmetrical.  Speech is fluent. Motor exam as listed above. Psychiatric: Judgment intact, Mood & affect appropriate for pt's clinical situation. Dermatologic: Very erythematous, little change from last visit but increased discomfort  Lymph : No Cervical lymphadenopathy, no lichenification or skin changes of chronic lymphedema.      Assessment & Plan:  1. Gastroesophageal reflux  disease, esophagitis presence not specified Continue PPI as already ordered, this medication has been reviewed and there are no changes at this time.  Avoidence of caffeine and alcohol  Moderate elevation of the head of the bed   2. Essential hypertension, benign Continue antihypertensive medications as already ordered, these medications have been reviewed and there are no changes at this time.   3. Lymphedema Despite bilateral Unna wraps, the patient still is edematous and very erythematous.  The patient also states that his legs are  sore and painful to the touch.  We will try a different antibiotic this time to see if it helps to relieve some of the cellulitis.  Also advised the patient that he needs to elevate his lower extremities as much as possible, as this is likely contributing to the pain and discomfort as well.  On his next Unna wrap visit we will try calamine wraps in order to see if this is also causing some of his issues.    Current Outpatient Medications on File Prior to Visit  Medication Sig Dispense Refill  . ACCU-CHEK AVIVA PLUS test strip USE AS INSTRUCTED. USE 1 STRIP EVERY 3 HOURS . ACCU-CHECK AVIVA PLUS TEST STRIP. DX E10.649  12  . cyanocobalamin (,VITAMIN B-12,) 1000 MCG/ML injection Inject into the muscle.    . ferrous sulfate 325 (65 FE) MG tablet Take 325 mg by mouth daily.    . furosemide (LASIX) 20 MG tablet Take 1 tablet (20 mg total) by mouth daily. 30 tablet 1  . GLUCAGON EMERGENCY 1 MG injection     . glucagon, human recombinant, (GLUCAGEN DIAGNOSTIC) 1 MG injection Use as directed    . glucose 4 GM chewable tablet Chew 1 tablet by mouth once as needed for low blood sugar.    Marland Kitchen glucose blood (PRECISION QID TEST) test strip Use 8 (eight) times daily as directed. BAYER CONTOUR NEXT TEST STRIPS E10.649    . insulin aspart (NOVOLOG) 100 UNIT/ML injection Basal rate 12am 0.8 units per hour, 9am 0.9 units per hour. (total basal insulin 20.7 units). Carbohydrate  ratio 1 units for every 8gm of carbohydrate. Correction factor 1 units for every 50mg /dl over target cbg. Target CBG 80-120 1 vial 12  . levothyroxine (SYNTHROID, LEVOTHROID) 25 MCG tablet Take 25 mcg by mouth daily.    Marland Kitchen lisinopril (PRINIVIL,ZESTRIL) 20 MG tablet Take 20 mg by mouth daily.    . Multiple Vitamin (MULTIVITAMIN) tablet Take 1 tablet by mouth daily.    . pravastatin (PRAVACHOL) 40 MG tablet Take 1 tablet (40 mg total) by mouth at bedtime. Will need office visit for any more refills 30 tablet 0  . spironolactone (ALDACTONE) 25 MG tablet Take 25 mg by mouth daily.    . carvedilol (COREG) 3.125 MG tablet Take 3.125 mg by mouth 2 (two) times daily with a meal.     . prochlorperazine (COMPAZINE) 10 MG tablet TAKE 1 TABLET (10 MG TOTAL) BY MOUTH EVERY 6 (SIX) HOURS AS NEEDED (NAUSEA OR VOMITING).     Current Facility-Administered Medications on File Prior to Visit  Medication Dose Route Frequency Provider Last Rate Last Dose  . heparin lock flush 100 unit/mL  500 Units Intravenous Once Lloyd Huger, MD      . sodium chloride flush (NS) 0.9 % injection 10 mL  10 mL Intravenous PRN Lloyd Huger, MD   10 mL at 02/01/18 0829  . sodium chloride flush (NS) 0.9 % injection 10 mL  10 mL Intravenous PRN Lloyd Huger, MD   10 mL at 04/05/18 0800    There are no Patient Instructions on file for this visit. No follow-ups on file.   Kris Hartmann, NP  This note was completed with Sales executive.  Any errors are purely unintentional.

## 2018-11-08 NOTE — Telephone Encounter (Signed)
Patient was in the office and seen Arna Medici, NP. Arna Medici wanted to start the patient on calamine wraps but we did not have them in stock and had to order them. Patient is aware at his next weekly appointment he will get the new wraps started. Tammy W at the front desk was made aware and documented in his upcoming nurse visits to use the calamine.

## 2018-11-15 ENCOUNTER — Ambulatory Visit (INDEPENDENT_AMBULATORY_CARE_PROVIDER_SITE_OTHER): Payer: Medicare Other | Admitting: Nurse Practitioner

## 2018-11-15 ENCOUNTER — Encounter (INDEPENDENT_AMBULATORY_CARE_PROVIDER_SITE_OTHER): Payer: Self-pay

## 2018-11-15 VITALS — BP 130/68 | HR 105 | Resp 20 | Ht 69.0 in | Wt 264.4 lb

## 2018-11-15 DIAGNOSIS — I890 Lymphedema, not elsewhere classified: Secondary | ICD-10-CM

## 2018-11-15 NOTE — Progress Notes (Signed)
History of Present Illness  There is no documented history at this time  Assessments & Plan   There are no diagnoses linked to this encounter.    Additional instructions  Subjective:  Patient presents with venous ulcer of the Bilateral lower extremity.    Procedure:  3 layer unna wrap was placed Bilateral lower extremity.   Plan:   Follow up in one week.  

## 2018-11-16 ENCOUNTER — Encounter: Payer: Self-pay | Admitting: Internal Medicine

## 2018-11-18 DIAGNOSIS — E1029 Type 1 diabetes mellitus with other diabetic kidney complication: Secondary | ICD-10-CM | POA: Diagnosis not present

## 2018-11-18 DIAGNOSIS — I129 Hypertensive chronic kidney disease with stage 1 through stage 4 chronic kidney disease, or unspecified chronic kidney disease: Secondary | ICD-10-CM | POA: Diagnosis not present

## 2018-11-18 DIAGNOSIS — E1129 Type 2 diabetes mellitus with other diabetic kidney complication: Secondary | ICD-10-CM | POA: Diagnosis not present

## 2018-11-18 DIAGNOSIS — N183 Chronic kidney disease, stage 3 (moderate): Secondary | ICD-10-CM | POA: Diagnosis not present

## 2018-11-18 DIAGNOSIS — R809 Proteinuria, unspecified: Secondary | ICD-10-CM | POA: Diagnosis not present

## 2018-11-22 ENCOUNTER — Ambulatory Visit (INDEPENDENT_AMBULATORY_CARE_PROVIDER_SITE_OTHER): Payer: Medicare Other | Admitting: Nurse Practitioner

## 2018-11-25 ENCOUNTER — Encounter: Payer: Self-pay | Admitting: Internal Medicine

## 2018-11-25 ENCOUNTER — Ambulatory Visit (INDEPENDENT_AMBULATORY_CARE_PROVIDER_SITE_OTHER): Payer: Medicare Other | Admitting: Internal Medicine

## 2018-11-25 VITALS — BP 120/60 | HR 82 | Temp 97.5°F | Wt 259.8 lb

## 2018-11-25 DIAGNOSIS — E079 Disorder of thyroid, unspecified: Secondary | ICD-10-CM

## 2018-11-25 DIAGNOSIS — R0602 Shortness of breath: Secondary | ICD-10-CM

## 2018-11-25 DIAGNOSIS — N183 Chronic kidney disease, stage 3 unspecified: Secondary | ICD-10-CM

## 2018-11-25 DIAGNOSIS — C8318 Mantle cell lymphoma, lymph nodes of multiple sites: Secondary | ICD-10-CM

## 2018-11-25 DIAGNOSIS — I428 Other cardiomyopathies: Secondary | ICD-10-CM | POA: Diagnosis not present

## 2018-11-25 DIAGNOSIS — D649 Anemia, unspecified: Secondary | ICD-10-CM | POA: Diagnosis not present

## 2018-11-25 DIAGNOSIS — E7800 Pure hypercholesterolemia, unspecified: Secondary | ICD-10-CM | POA: Diagnosis not present

## 2018-11-25 DIAGNOSIS — E1151 Type 2 diabetes mellitus with diabetic peripheral angiopathy without gangrene: Secondary | ICD-10-CM | POA: Diagnosis not present

## 2018-11-25 DIAGNOSIS — K219 Gastro-esophageal reflux disease without esophagitis: Secondary | ICD-10-CM

## 2018-11-25 DIAGNOSIS — I5022 Chronic systolic (congestive) heart failure: Secondary | ICD-10-CM

## 2018-11-25 DIAGNOSIS — I10 Essential (primary) hypertension: Secondary | ICD-10-CM | POA: Diagnosis not present

## 2018-11-25 DIAGNOSIS — D61818 Other pancytopenia: Secondary | ICD-10-CM

## 2018-11-25 DIAGNOSIS — E119 Type 2 diabetes mellitus without complications: Secondary | ICD-10-CM

## 2018-11-25 DIAGNOSIS — I429 Cardiomyopathy, unspecified: Secondary | ICD-10-CM

## 2018-11-25 NOTE — Progress Notes (Signed)
[name], male   DOB: [birth year], 73 y.o.   MRN: [id number]   Subjective:    [name], male    DOB: 1945-11-10, 73 y.o.   MRN: [id number]  HPI  Patient here for a scheduled follow up.  Still having issues with lower extremity swelling.  Seeing vascular surgery regularly.  Legs wrapped.  No chest pain.  Does report noticing some sob with increased exertion.  Does not notice with ambulation around his house.  He feels that this is overall stable.  Sees cardiology.  Has f/u scheduled.  Declines further testing now, stating feels things are relatively stable.  No acid reflux. No abdominal pain.  Bowels moving.  Takes tylenol for his leg pain.  Helps.  Seeing nephrology.  Last GFR 52.  States due f/u in 03/2019.  Has a new sensor. States since changing to this sensor, has not had as much of a problem with low sugars.  Has f/u scheduled with Dr Honor Junes.  Blood sugar in the 80s today at the visit.     Past Medical History:  Diagnosis Date  . Depression   . 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 Gastroparesis   . Graves disease   . Hypercholesterolemia   . Hypertension   . Mantle cell lymphoma ([place]) 07/2014  . Peripheral neuropathy    Past Surgical History:  Procedure Laterality Date  . CHOLECYSTECTOMY    . NEPHRECTOMY     right  . PACEMAKER INSERTION N/A 12/07/2017   Procedure: INSERTION PACEMAKER DUAL CHAMBER INITIAL INSERT;  Surgeon: Isaias Cowman, MD;  Location: [facility];  Service: Cardiovascular;  Laterality: N/A;  . PORTA CATH INSERTION N/A 11/03/2017   Procedure: PORTA CATH INSERTION;  Surgeon: Katha Cabal, MD;  Location: [place] [facility];  Service: Cardiovascular;  Laterality: N/A;   Family History  Problem Relation Age of Onset  . Breast cancer Mother   . Diabetes Father   . Cancer Father        Lymphoma  . Alcohol abuse Maternal Uncle   . Diabetes Sister   . Heart disease  Other        grandfather   Social History   Socioeconomic History  . Marital status: Married    Spouse name: Not on file  . Number of children: 1  . Years of education: Not on file  . Highest education level: Not on file  Occupational History  . Not on file  Social Needs  . Financial resource strain: Not on file  . Food insecurity:    Worry: Not on file    Inability: Not on file  . Transportation needs:    Medical: Not on file    Non-medical: Not on file  Tobacco Use  . Smoking status: Former Research scientist (life sciences)  . Smokeless tobacco: Current User    Types: Chew  Substance and Sexual Activity  . Alcohol use: Yes    Alcohol/week: 3.0 standard drinks    Types: 3 Cans of beer per week    Comment: nightly  . Drug use: Yes    Types: Codeine  . Sexual activity: Not Currently  Lifestyle  . Physical activity:    Days per week: Not on file    Minutes per session: Not on file  . Stress: Not on file  Relationships  . Social connections:    Talks on phone: Not on file    Gets together:  Not on file    Attends religious service: Not on file    Active member of club or organization: Not on file    Attends meetings of clubs or organizations: Not on file    Relationship status: Not on file  Other Topics Concern  . Not on file  Social History Narrative  . Not on file    Outpatient Encounter Medications as of 11/25/2018  Medication Sig  . ACCU-CHEK AVIVA PLUS test strip USE AS INSTRUCTED. USE 1 STRIP EVERY 3 HOURS . ACCU-CHECK AVIVA PLUS TEST STRIP. DX W54.627  . cyanocobalamin (,VITAMIN B-12,) 1000 MCG/ML injection Inject into the muscle.  . ferrous sulfate 325 (65 FE) MG tablet Take 325 mg by mouth daily.  Marland Kitchen GLUCAGON EMERGENCY 1 MG injection   . glucagon, human recombinant, (GLUCAGEN DIAGNOSTIC) 1 MG injection Use as directed  . glucose 4 GM chewable tablet Chew 1 tablet by mouth once as needed for low blood sugar.  Marland Kitchen glucose blood (PRECISION QID TEST) test strip Use 8 (eight) times daily  as directed. BAYER CONTOUR NEXT TEST STRIPS E10.649  . insulin aspart (NOVOLOG) 100 UNIT/ML injection Basal rate 12am 0.8 units per hour, 9am 0.9 units per hour. (total basal insulin 20.7 units). Carbohydrate ratio 1 units for every 8gm of carbohydrate. Correction factor 1 units for every 50mg /dl over target cbg. Target CBG 80-120  . levothyroxine (SYNTHROID, LEVOTHROID) 25 MCG tablet Take 25 mcg by mouth daily.  Marland Kitchen lisinopril (PRINIVIL,ZESTRIL) 20 MG tablet Take 20 mg by mouth daily.  . Multiple Vitamin (MULTIVITAMIN) tablet Take 1 tablet by mouth daily.  . pravastatin (PRAVACHOL) 40 MG tablet Take 1 tablet (40 mg total) by mouth at bedtime. Will need office visit for any more refills  . prochlorperazine (COMPAZINE) 10 MG tablet TAKE 1 TABLET (10 MG TOTAL) BY MOUTH EVERY 6 (SIX) HOURS AS NEEDED (NAUSEA OR VOMITING).  Marland Kitchen spironolactone (ALDACTONE) 25 MG tablet Take 25 mg by mouth daily.  . carvedilol (COREG) 3.125 MG tablet Take 3.125 mg by mouth 2 (two) times daily with a meal.   . furosemide (LASIX) 20 MG tablet Take 1 tablet (20 mg total) by mouth daily.   Facility-Administered Encounter Medications as of 11/25/2018  Medication  . heparin lock flush 100 unit/mL  . sodium chloride flush (NS) 0.9 % injection 10 mL  . sodium chloride flush (NS) 0.9 % injection 10 mL    Review of Systems  Constitutional: Negative for appetite change and unexpected weight change.  HENT: Negative for congestion and sinus pressure.   Respiratory: Negative for cough and chest tightness.        Some sob with exertion.  Overall relatively stable.   Cardiovascular: Positive for leg swelling. Negative for chest pain and palpitations.  Gastrointestinal: Negative for abdominal pain, diarrhea, nausea and vomiting.  Genitourinary: Negative for difficulty urinating and dysuria.  Musculoskeletal: Negative for joint swelling and myalgias.  Skin: Negative for color change and rash.  Neurological: Negative for dizziness,  light-headedness and headaches.  Psychiatric/Behavioral: Negative for agitation and dysphoric mood.       Objective:    Physical Exam Constitutional:      General: He is not in acute distress.    Appearance: Normal appearance. He is well-developed.  HENT:     Nose: Nose normal. No congestion.     Mouth/Throat:     Pharynx: No oropharyngeal exudate or posterior oropharyngeal erythema.  Cardiovascular:     Rate and Rhythm: Normal rate and regular rhythm.  Pulmonary:     Effort: Pulmonary effort is normal. No respiratory distress.     Breath sounds: Normal breath sounds.  Abdominal:     General: Bowel sounds are normal.     Palpations: Abdomen is soft.     Tenderness: There is no abdominal tenderness.  Musculoskeletal:     Comments: Lower legs wrapped.  Still with persistent swelling.    Skin:    Comments: No visualized erythema.  Legs wrapped and followed by vascular surgery.    Neurological:     Mental Status: He is alert.  Psychiatric:        Mood and Affect: Mood normal.        Behavior: Behavior normal.     BP 120/60   Pulse 82   Temp (!) 97.5 F (36.4 C) (Oral)   Wt 259 lb 12.8 oz (117.8 kg)   SpO2 98%   BMI 38.37 kg/m  Wt Readings from Last 3 Encounters:  11/29/18 256 lb (116.1 kg)  11/25/18 259 lb 12.8 oz (117.8 kg)  11/22/18 257 lb (116.6 kg)     Lab Results  Component Value Date   WBC 2.4 (L) 09/29/2018   HGB 10.8 (L) 09/29/2018   HCT 32.3 (L) 09/29/2018   PLT 128 (L) 09/29/2018   GLUCOSE 93 09/29/2018   CHOL 197 08/09/2015   TRIG 51.0 08/09/2015   HDL 73.60 08/09/2015   LDLCALC 113 (H) 08/09/2015   ALT 17 09/29/2018   AST 29 09/29/2018   NA 141 09/29/2018   K 3.9 09/29/2018   CL 113 (H) 09/29/2018   CREATININE 1.37 (H) 09/29/2018   BUN 22 09/29/2018   CO2 21 (L) 09/29/2018   TSH 3.894 10/28/2017   INR 1.12 07/14/2018   HGBA1C 5.6 10/28/2017   MICROALBUR 1.9 09/22/2016    Ct Head Wo Contrast  Result Date: 07/14/2018 CLINICAL DATA:   Fall on Coumadin.  Bruising to forehead. EXAM: CT HEAD WITHOUT CONTRAST TECHNIQUE: Contiguous axial images were obtained from the base of the skull through the vertex without intravenous contrast. COMPARISON:  Head CT 10/28/2017 FINDINGS: Brain: Unchanged degree of atrophy and chronic small vessel ischemia. Remote lacunar infarct in right basal ganglia. No intracranial hemorrhage, mass effect, or midline shift. No hydrocephalus. The basilar cisterns are patent. No evidence of territorial infarct or acute ischemia. No extra-axial or intracranial fluid collection. Vascular: Atherosclerosis of skullbase vasculature without hyperdense vessel or abnormal calcification. Skull: No fracture or focal lesion. Sinuses/Orbits: Minimal mucosal thickening of ethmoid air cells. No sinus fluid level. Bilateral cataract resection. Other: None. IMPRESSION: 1.  No acute intracranial abnormality.  No skull fracture. 2. Unchanged atrophy and chronic small vessel ischemia. Electronically Signed   By: Keith Rake M.D.   On: 07/14/2018 04:57   Dg Chest Port 1 View  Result Date: 07/14/2018 CLINICAL DATA:  Fall, weakness. EXAM: PORTABLE CHEST 1 VIEW COMPARISON:  CT 07/04/2018, most recent radiograph 12/07/2017 FINDINGS: Right chest port with tip in the mid SVC. Left-sided pacemaker in place. Chronic cardiomegaly appears similar to prior exams. No pulmonary edema, focal airspace disease, large pleural effusion or pneumothorax. Subsegmental atelectasis or scarring in the left lung. 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 IMPRESSION: Chronic cardiomegaly. Subsegmental atelectasis or scarring in the left lung. Electronically Signed   By: Keith Rake M.D.   On: 07/14/2018 04:22       Assessment & Plan:   Problem List Items Addressed This Visit    Anemia  Followed by oncology.        Cardiomyopathy, idiopathic ([place])    EF documented 40%.  Followed by cardiology.  Some sob with  increased exertion.  Feels is relatively stable.  Declines further evaluation or w/up at this time.        Chronic systolic CHF (congestive heart failure) ([place])    Followed by cardiology as outlined.  Declines further evaluation or w/up at this time.        CKD (chronic kidney disease) stage 3, GFR 30-59 ml/min (H[facility])    Followed by nephrology.  Has f/u planned in 03/2019.        Diabetes ([place])    Followed by endocrinology.  Has a new sensor now.  Decreased problems with low blood sugars.  Blood sugar 80s today.  Was given glucose tablets.  Prior to leaving 99.  Follow.        Essential hypertension, benign    Blood pressure under good control.  Continue same medication regimen.  Follow pressures.  Follow metabolic panel.        GERD (gastroesophageal reflux disease)    No upper symptoms reported.        Hypercholesterolemia    On pravastatin.  Low cholesterol diet and exercise.  Follow lipid panel and liver function tests.        Mantle cell lymphoma ([place])    Followed by oncology.        Pancytopenia ([place])    Followed by oncology.        SOB (shortness of breath)    Discussed with him today.  Overall he feels things are relatively stable.  Desires no further testing or evaluation at this time.  Follow.  Continue f/u with cardiology.        Thyroid disease    Followed by endocrinology.         Other Visit Diagnoses    Diabetes mellitus without complication ([place])    -  Primary   Relevant Orders   POCT Glucose (CBG)       [name], MD

## 2018-11-29 ENCOUNTER — Ambulatory Visit (INDEPENDENT_AMBULATORY_CARE_PROVIDER_SITE_OTHER): Payer: Medicare Other | Admitting: Vascular Surgery

## 2018-11-29 ENCOUNTER — Encounter (INDEPENDENT_AMBULATORY_CARE_PROVIDER_SITE_OTHER): Payer: Self-pay | Admitting: Vascular Surgery

## 2018-11-29 VITALS — BP 96/61 | HR 102 | Resp 16 | Ht 69.0 in | Wt 256.0 lb

## 2018-11-29 DIAGNOSIS — I890 Lymphedema, not elsewhere classified: Secondary | ICD-10-CM

## 2018-11-29 DIAGNOSIS — I5022 Chronic systolic (congestive) heart failure: Secondary | ICD-10-CM

## 2018-11-29 DIAGNOSIS — E1151 Type 2 diabetes mellitus with diabetic peripheral angiopathy without gangrene: Secondary | ICD-10-CM

## 2018-11-29 DIAGNOSIS — L97219 Non-pressure chronic ulcer of right calf with unspecified severity: Secondary | ICD-10-CM

## 2018-11-29 DIAGNOSIS — L97229 Non-pressure chronic ulcer of left calf with unspecified severity: Secondary | ICD-10-CM | POA: Diagnosis not present

## 2018-11-29 NOTE — Progress Notes (Signed)
Subjective:    [name], male    DOB: June 14, 1946, 73 y.o.   MRN: [id number] Chief Complaint  Patient presents with  . Follow-up    unna boot check   Patient presents for a monthly unna boot therapy / lymphedema with ulceration follow-up.  The patient is notoriously noncompliant in regard to elevation and use of his lymphedema pump.  The patient presents today with relatively controlled edema however he still continues to show slow healing to the 2 ulcerations noted to the bilateral calves.  The patient notes that he has not been elevating his legs.  This is noticeable on physical exam by the edema noted in his toes.  Patient also notes that he has been using his lymphedema pump.  I had a long discussion with the patient about the pathophysiology of lymphedema and ulcer formation.  We discussed how elevation and use of his lymphedema pump is imperative and directly affects the ability to control his edema and heal his ulcerations.  We also had a long discussion about the use of antibiotics.  We discussed how being on antibiotics on a regular basis can cause other health issues.  We discussed how uncontrolled edema and ulcerations to the lower extremity can lead to recurrent cellulitis and the need for antibiotics.  Patient has not had a bilateral ABI in over a year.  When he comes back I will order one to assess his peripheral artery disease and if it is not a contributing factor.  Patient denies any fever, nausea vomiting.  Review of Systems  Constitutional: Negative.   HENT: Negative.   Eyes: Negative.   Respiratory: Negative.   Cardiovascular: Positive for leg swelling.  Gastrointestinal: Negative.   Endocrine: Negative.   Genitourinary: Negative.   Musculoskeletal: Negative.   Skin: Positive for wound.  Allergic/Immunologic: Negative.   Neurological: Negative.   Hematological: Negative.   Psychiatric/Behavioral: Negative.       Objective:   Physical Exam Vitals signs  reviewed.  Constitutional:      Appearance: Normal appearance. He is obese.  HENT:     Head: Normocephalic and atraumatic.     Right Ear: External ear normal.     Left Ear: External ear normal.     Nose: Nose normal.     Mouth/Throat:     Mouth: Mucous membranes are moist.     Pharynx: Oropharynx is clear.  Eyes:     Extraocular Movements: Extraocular movements intact.     Conjunctiva/sclera: Conjunctivae normal.     Pupils: Pupils are equal, round, and reactive to light.  Neck:     Musculoskeletal: Normal range of motion.  Cardiovascular:     Rate and Rhythm: Normal rate and regular rhythm.  Pulmonary:     Effort: Pulmonary effort is normal.     Breath sounds: Normal breath sounds.  Musculoskeletal:        General: Swelling (Mild to moderate nonpitting edema noted bilaterally) present.  Skin:    Comments: There is no cellulitis to the bilateral legs at this time. Right lower extremity: Noninfected shallow ulceration to the lateral aspect of the calf Left lower extremity: Multiple shallow noninfected ulcer noted to the front of the calf  Neurological:     General: No focal deficit present.     Mental Status: He is alert and oriented to person, place, and time. Mental status is at baseline.  Psychiatric:        Mood and Affect: Mood normal.  Behavior: Behavior normal.        Thought Content: Thought content normal.        Judgment: Judgment normal.    BP 96/61 (BP Location: Left Arm)   Pulse (!) 102   Resp 16   Ht 5\' 9"  (1.753 m)   Wt 256 lb (116.1 kg)   BMI 37.80 kg/m   Past Medical History:  Diagnosis Date  . Depression   . 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 Gastroparesis   . Graves disease   . Hypercholesterolemia   . Hypertension   . Mantle cell lymphoma ([place]) 07/2014  . Peripheral neuropathy    Social History   Socioeconomic History  . Marital status: Married    Spouse name: Not on file    . Number of children: 1  . Years of education: Not on file  . Highest education level: Not on file  Occupational History  . Not on file  Social Needs  . Financial resource strain: Not on file  . Food insecurity:    Worry: Not on file    Inability: Not on file  . Transportation needs:    Medical: Not on file    Non-medical: Not on file  Tobacco Use  . Smoking status: Former Research scientist (life sciences)  . Smokeless tobacco: Current User    Types: Chew  Substance and Sexual Activity  . Alcohol use: Yes    Alcohol/week: 3.0 standard drinks    Types: 3 Cans of beer per week    Comment: nightly  . Drug use: No  . Sexual activity: Not Currently  Lifestyle  . Physical activity:    Days per week: Not on file    Minutes per session: Not on file  . Stress: Not on file  Relationships  . Social connections:    Talks on phone: Not on file    Gets together: Not on file    Attends religious service: Not on file    Active member of club or organization: Not on file    Attends meetings of clubs or organizations: Not on file    Relationship status: Not on file  . Intimate partner violence:    Fear of current or ex partner: Not on file    Emotionally abused: Not on file    Physically abused: Not on file    Forced sexual activity: Not on file  Other Topics Concern  . Not on file  Social History Narrative  . Not on file   Past Surgical History:  Procedure Laterality Date  . CHOLECYSTECTOMY    . NEPHRECTOMY     right  . PACEMAKER INSERTION N/A 12/07/2017   Procedure: INSERTION PACEMAKER DUAL CHAMBER INITIAL INSERT;  Surgeon: Isaias Cowman, MD;  Location: [facility];  Service: Cardiovascular;  Laterality: N/A;  . PORTA CATH INSERTION N/A 11/03/2017   Procedure: PORTA CATH INSERTION;  Surgeon: Katha Cabal, MD;  Location: [facility];  Service: Cardiovascular;  Laterality: N/A;   Family History  Problem Relation Age of Onset  . Breast cancer Mother   . Diabetes Father   . Cancer  Father        Lymphoma  . Alcohol abuse Maternal Uncle   . Diabetes Sister   . Heart disease Other        grandfather   Allergies  Allergen Reactions  . Rofecoxib Nausea Only      Assessment & Plan:  Patient presents for  a monthly unna boot therapy / lymphedema with ulceration follow-up.  The patient is notoriously noncompliant in regard to elevation and use of his lymphedema pump.  The patient presents today with relatively controlled edema however he still continues to show slow healing to the 2 ulcerations noted to the bilateral calves.  The patient notes that he has not been elevating his legs.  This is noticeable on physical exam by the edema noted in his toes.  Patient also notes that he has been using his lymphedema pump.  I had a long discussion with the patient about the pathophysiology of lymphedema and ulcer formation.  We discussed how elevation and use of his lymphedema pump is imperative and directly affects the ability to control his edema and heal his ulcerations.  We also had a long discussion about the use of antibiotics.  We discussed how being on antibiotics on a regular basis can cause other health issues.  We discussed how uncontrolled edema and ulcerations to the lower extremity can lead to recurrent cellulitis and the need for antibiotics.  Patient has not had a bilateral ABI in over a year.  When he comes back I will order one to assess his peripheral artery disease and if it is not a contributing factor.  Patient denies any fever, nausea vomiting.  1. Lymphedema - Stable At a long discussion with the patient today about his noncompliance with elevation and use of his lymphedema pump. At this time, the patient's edema is relatively controlled however he still has active ulcers to the bilateral calves Patient does not have any active cellulitis at this time nor are his ulcers infected and I would not be prescribing any antibiotics. Patient is to elevate his legs heart level  or higher as much as possible multiple times a day Patient is to use his lymphedema pump at least twice a day for an hour each time and elevated position I will order an ABI the patient follows up in 1 month Recommend continued bilateral 3 layer zinc oxide Unna wraps Wraps were placed today Is to follow-up in 1 month so I can assess his progress with Unna boot therapy and an ABI to rule out any contributing peripheral artery disease  - VAS [place] ABI WITH/WO TBI; Future  2. Chronic systolic CHF (congestive heart failure) (HCC) - Stable This is certainly contributing factor to the patient's bilateral lower extremity edema This is followed by the patient's primary care physician/cardiologist  3. Type 2 diabetes mellitus with diabetic peripheral angiopathy without gangrene, without long-term current use of insulin (HCC) - Stable Encouraged good blood sugar control as this directly affects the patient's ability to heal his ulcerations Followed by the patient's primary care physician or endocrinologist  Current Outpatient Medications on File Prior to Visit  Medication Sig Dispense Refill  . ACCU-CHEK AVIVA PLUS test strip USE AS INSTRUCTED. USE 1 STRIP EVERY 3 HOURS . ACCU-CHECK AVIVA PLUS TEST STRIP. DX E10.649  12  . cyanocobalamin (,VITAMIN B-12,) 1000 MCG/ML injection Inject into the muscle.    . ferrous sulfate 325 (65 FE) MG tablet Take 325 mg by mouth daily.    Marland Kitchen GLUCAGON EMERGENCY 1 MG injection     . glucagon, human recombinant, (GLUCAGEN DIAGNOSTIC) 1 MG injection Use as directed    . glucose 4 GM chewable tablet Chew 1 tablet by mouth once as needed for low blood sugar.    Marland Kitchen glucose blood (PRECISION QID TEST) test strip Use 8 (eight) times daily as  directed. BAYER CONTOUR NEXT TEST STRIPS E10.649    . insulin aspart (NOVOLOG) 100 UNIT/ML injection Basal rate 12am 0.8 units per hour, 9am 0.9 units per hour. (total basal insulin 20.7 units). Carbohydrate ratio 1 units for every 8gm of  carbohydrate. Correction factor 1 units for every 50mg /dl over target cbg. Target CBG 80-120 1 vial 12  . levothyroxine (SYNTHROID, LEVOTHROID) 25 MCG tablet Take 25 mcg by mouth daily.    Marland Kitchen lisinopril (PRINIVIL,ZESTRIL) 20 MG tablet Take 20 mg by mouth daily.    . Multiple Vitamin (MULTIVITAMIN) tablet Take 1 tablet by mouth daily.    . pravastatin (PRAVACHOL) 40 MG tablet Take 1 tablet (40 mg total) by mouth at bedtime. Will need office visit for any more refills 30 tablet 0  . prochlorperazine (COMPAZINE) 10 MG tablet TAKE 1 TABLET (10 MG TOTAL) BY MOUTH EVERY 6 (SIX) HOURS AS NEEDED (NAUSEA OR VOMITING).    Marland Kitchen spironolactone (ALDACTONE) 25 MG tablet Take 25 mg by mouth daily.    . carvedilol (COREG) 3.125 MG tablet Take 3.125 mg by mouth 2 (two) times daily with a meal.     . furosemide (LASIX) 20 MG tablet Take 1 tablet (20 mg total) by mouth daily. 30 tablet 1   Current Facility-Administered Medications on File Prior to Visit  Medication Dose Route Frequency Provider Last Rate Last Dose  . heparin lock flush 100 unit/mL  500 Units Intravenous Once Lloyd Huger, MD      . sodium chloride flush (NS) 0.9 % injection 10 mL  10 mL Intravenous PRN Lloyd Huger, MD   10 mL at 02/01/18 0829  . sodium chloride flush (NS) 0.9 % injection 10 mL  10 mL Intravenous PRN Lloyd Huger, MD   10 mL at 04/05/18 0800   There are no Patient Instructions on file for this visit. No follow-ups on file.  Kiauna Zywicki A Stefhanie Kachmar, PA-C

## 2018-12-03 ENCOUNTER — Encounter: Payer: Self-pay | Admitting: Internal Medicine

## 2018-12-03 DIAGNOSIS — D61818 Other pancytopenia: Secondary | ICD-10-CM | POA: Insufficient documentation

## 2018-12-03 NOTE — Assessment & Plan Note (Signed)
Followed by oncology 

## 2018-12-03 NOTE — Assessment & Plan Note (Signed)
No upper symptoms reported.   

## 2018-12-03 NOTE — Assessment & Plan Note (Signed)
Followed by cardiology as outlined.  Declines further evaluation or w/up at this time.

## 2018-12-03 NOTE — Assessment & Plan Note (Signed)
EF documented 40%.  Followed by cardiology.  Some sob with increased exertion.  Feels is relatively stable.  Declines further evaluation or w/up at this time.

## 2018-12-03 NOTE — Assessment & Plan Note (Signed)
Discussed with him today.  Overall he feels things are relatively stable.  Desires no further testing or evaluation at this time.  Follow.  Continue f/u with cardiology.

## 2018-12-03 NOTE — Assessment & Plan Note (Signed)
Followed by endocrinology.  Has a new sensor now.  Decreased problems with low blood sugars.  Blood sugar 80s today.  Was given glucose tablets.  Prior to leaving 99.  Follow.

## 2018-12-03 NOTE — Assessment & Plan Note (Signed)
Followed by nephrology.  Has f/u planned in 03/2019.

## 2018-12-03 NOTE — Assessment & Plan Note (Signed)
On pravastatin.  Low cholesterol diet and exercise.  Follow lipid panel and liver function tests.   

## 2018-12-03 NOTE — Assessment & Plan Note (Signed)
Followed by endocrinology 

## 2018-12-03 NOTE — Assessment & Plan Note (Signed)
Blood pressure under good control.  Continue same medication regimen.  Follow pressures.  Follow metabolic panel.   

## 2018-12-06 ENCOUNTER — Ambulatory Visit (INDEPENDENT_AMBULATORY_CARE_PROVIDER_SITE_OTHER): Payer: Medicare Other | Admitting: Nurse Practitioner

## 2018-12-06 ENCOUNTER — Encounter (INDEPENDENT_AMBULATORY_CARE_PROVIDER_SITE_OTHER): Payer: Self-pay

## 2018-12-06 VITALS — BP 100/62 | HR 98 | Resp 17 | Wt 249.4 lb

## 2018-12-06 DIAGNOSIS — I890 Lymphedema, not elsewhere classified: Secondary | ICD-10-CM

## 2018-12-06 NOTE — Progress Notes (Signed)
History of Present Illness  There is no documented history at this time  Assessments & Plan   There are no diagnoses linked to this encounter.    Additional instructions  Subjective:  Patient presents with venous ulcer of the Bilateral lower extremity.    Procedure:  3 layer unna wrap was placed Bilateral lower extremity.   Plan:   Follow up in one week.  

## 2018-12-13 ENCOUNTER — Ambulatory Visit (INDEPENDENT_AMBULATORY_CARE_PROVIDER_SITE_OTHER): Payer: Medicare Other | Admitting: Nurse Practitioner

## 2018-12-13 ENCOUNTER — Encounter (INDEPENDENT_AMBULATORY_CARE_PROVIDER_SITE_OTHER): Payer: Self-pay

## 2018-12-13 VITALS — BP 99/63 | HR 98 | Resp 16 | Ht 69.0 in | Wt 251.0 lb

## 2018-12-13 DIAGNOSIS — L97929 Non-pressure chronic ulcer of unspecified part of left lower leg with unspecified severity: Secondary | ICD-10-CM

## 2018-12-13 DIAGNOSIS — I890 Lymphedema, not elsewhere classified: Secondary | ICD-10-CM

## 2018-12-13 DIAGNOSIS — L97919 Non-pressure chronic ulcer of unspecified part of right lower leg with unspecified severity: Secondary | ICD-10-CM | POA: Diagnosis not present

## 2018-12-13 NOTE — Progress Notes (Signed)
History of Present Illness  There is no documented history at this time  Assessments & Plan   There are no diagnoses linked to this encounter.    Additional instructions  Subjective:  Patient presents with venous ulcer of the Bilateral lower extremity.    Procedure:  3 layer unna wrap was placed Bilateral lower extremity.   Plan:   Follow up in one week.  

## 2018-12-20 ENCOUNTER — Encounter (INDEPENDENT_AMBULATORY_CARE_PROVIDER_SITE_OTHER): Payer: Self-pay | Admitting: Nurse Practitioner

## 2018-12-20 ENCOUNTER — Ambulatory Visit (INDEPENDENT_AMBULATORY_CARE_PROVIDER_SITE_OTHER): Payer: Medicare Other

## 2018-12-20 ENCOUNTER — Ambulatory Visit (INDEPENDENT_AMBULATORY_CARE_PROVIDER_SITE_OTHER): Payer: Medicare Other | Admitting: Nurse Practitioner

## 2018-12-20 VITALS — BP 98/60 | HR 96 | Resp 16 | Ht 69.0 in | Wt 252.6 lb

## 2018-12-20 DIAGNOSIS — F17220 Nicotine dependence, chewing tobacco, uncomplicated: Secondary | ICD-10-CM | POA: Diagnosis not present

## 2018-12-20 DIAGNOSIS — I890 Lymphedema, not elsewhere classified: Secondary | ICD-10-CM | POA: Diagnosis not present

## 2018-12-20 DIAGNOSIS — E1151 Type 2 diabetes mellitus with diabetic peripheral angiopathy without gangrene: Secondary | ICD-10-CM

## 2018-12-20 DIAGNOSIS — Z79899 Other long term (current) drug therapy: Secondary | ICD-10-CM

## 2018-12-20 DIAGNOSIS — K219 Gastro-esophageal reflux disease without esophagitis: Secondary | ICD-10-CM | POA: Diagnosis not present

## 2018-12-20 DIAGNOSIS — Z7984 Long term (current) use of oral hypoglycemic drugs: Secondary | ICD-10-CM | POA: Diagnosis not present

## 2018-12-20 NOTE — Progress Notes (Signed)
SUBJECTIVE:  [name], male    DOB: 03/20/1946, 73 y.o.   MRN: [id number] Chief Complaint  Patient presents with  . Follow-up    unna check     HPI  Gary [name] is a 73 y.o. male that presents today for Unna wrap check as well as noninvasive studies.  The patient states that today his legs feel much better.  He states that he does not have any more tenderness or pain.  His wounds are healing very well.  His swelling looks much better.  He denies any fever, chills, nausea, vomiting or diarrhea.  The patient endorses utilizing elevation on a regular basis.  He also started taking an herbal supplement that is geared towards helping pain with varicose veins and he states that since he started using this his pain is also been greatly reduced.  He denies any TIA-like symptoms or amaurosis fugax.  Patient underwent bilateral ABIs today which reveal noncompressible ABIs with triphasic tibial artery waveforms bilaterally.  This is consistent with his previous study done on 10/20/2017.  Past Medical History:  Diagnosis Date  . Depression   . 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 Gastroparesis   . Graves disease   . Hypercholesterolemia   . Hypertension   . Mantle cell lymphoma ([place]) 07/2014  . Peripheral neuropathy     Past Surgical History:  Procedure Laterality Date  . CHOLECYSTECTOMY    . NEPHRECTOMY     right  . PACEMAKER INSERTION N/A 12/07/2017   Procedure: INSERTION PACEMAKER DUAL CHAMBER INITIAL INSERT;  Surgeon: Isaias Cowman, MD;  Location: [facility];  Service: Cardiovascular;  Laterality: N/A;  . PORTA CATH INSERTION N/A 11/03/2017   Procedure: PORTA CATH INSERTION;  Surgeon: Katha Cabal, MD;  Location: [facility];  Service: Cardiovascular;  Laterality: N/A;    Social History   Socioeconomic History  . Marital status: Married    Spouse name: Not on file  . Number of children: 1   . Years of education: Not on file  . Highest education level: Not on file  Occupational History  . Not on file  Social Needs  . Financial resource strain: Not on file  . Food insecurity:    Worry: Not on file    Inability: Not on file  . Transportation needs:    Medical: Not on file    Non-medical: Not on file  Tobacco Use  . Smoking status: Former Research scientist (life sciences)  . Smokeless tobacco: Current User    Types: Chew  Substance and Sexual Activity  . Alcohol use: Yes    Alcohol/week: 3.0 standard drinks    Types: 3 Cans of beer per week    Comment: nightly  . Drug use: Yes    Types: Codeine  . Sexual activity: Not Currently  Lifestyle  . Physical activity:    Days per week: Not on file    Minutes per session: Not on file  . Stress: Not on file  Relationships  . Social connections:    Talks on phone: Not on file    Gets together: Not on file    Attends religious service: Not on file    Active member of club or organization: Not on file    Attends meetings of clubs or organizations: Not on file    Relationship status: Not on file  . Intimate partner violence:    Fear of current or ex  partner: Not on file    Emotionally abused: Not on file    Physically abused: Not on file    Forced sexual activity: Not on file  Other Topics Concern  . Not on file  Social History Narrative  . Not on file    Family History  Problem Relation Age of Onset  . Breast cancer Mother   . Diabetes Father   . Cancer Father        Lymphoma  . Alcohol abuse Maternal Uncle   . Diabetes Sister   . Heart disease Other        grandfather    Allergies  Allergen Reactions  . Rofecoxib Nausea Only     Review of Systems   Review of Systems: Negative Unless Checked Constitutional: [] Weight loss  [] Fever  [] Chills Cardiac: [] Chest pain   [x]  Atrial Fibrillation  [] Palpitations   [] Shortness of breath when laying flat   [] Shortness of breath with exertion. [] Shortness of breath at rest Vascular:   [] Pain in legs with walking   [] Pain in legs with standing [] Pain in legs when laying flat   [] Claudication    [] Pain in feet when laying flat    [] History of DVT   [] Phlebitis   [x] Swelling in legs   [x] Varicose veins   [x] Non-healing ulcers Pulmonary:   [] Uses home oxygen   [] Productive cough   [] Hemoptysis   [] Wheeze  [] COPD   [] Asthma Neurologic:  [] Dizziness   [] Seizures  [] Blackouts [] History of stroke   [] History of TIA  [] Aphasia   [] Temporary Blindness   [] Weakness or numbness in arm   [] Weakness or numbness in leg Musculoskeletal:   [] Joint swelling   [] Joint pain   [] Low back pain  []  History of Knee Replacement [] Arthritis [] back Surgeries  []  Spinal Stenosis    Hematologic:  [] Easy bruising  [] Easy bleeding   [] Hypercoagulable state   [x] Anemic Gastrointestinal:  [] Diarrhea   [] Vomiting  [] Gastroesophageal reflux/heartburn   [] Difficulty swallowing. [] Abdominal pain Genitourinary:  [x] Chronic kidney disease   [] Difficult urination  [] Anuric   [] Blood in urine [] Frequent urination  [] Burning with urination   [] Hematuria Skin:  [] Rashes   [] Ulcers [] Wounds Psychological:  [] History of anxiety   []  History of major depression  []  Memory Difficulties      OBJECTIVE:   Physical Exam  BP 98/60 (BP Location: Right Arm)   Pulse 96   Resp 16   Ht 5\' 9"  (1.753 m)   Wt 252 lb 9.6 oz (114.6 kg)   BMI 37.30 kg/m   Gen: WD/WN, NAD Head: /AT, No temporalis wasting.  Ear/Nose/Throat: Hearing grossly intact, nares w/o erythema or drainage Eyes: PER, EOMI, sclera nonicteric.  Neck: Supple, no masses.  No JVD.  Pulmonary:  Good air movement, no use of accessory muscles.  Cardiac: RRR Vascular:  2+ soft edema bilaterally Vessel Right Left  Radial Palpable Palpable  Dorsalis Pedis Palpable Palpable  Posterior Tibial Palpable Palpable   Gastrointestinal: soft, non-distended. No guarding/no peritoneal signs.  Musculoskeletal: M/S 5/5 throughout.  No deformity or atrophy.  Neurologic:  Pain and light touch intact in extremities.  Symmetrical.  Speech is fluent. Motor exam as listed above. Psychiatric: Judgment intact, Mood & affect appropriate for pt's clinical situation. Dermatologic:  Stasis dermatitis bilaterally.  Small ulcerations noted bilaterally, nearly healed No changes consistent with cellulitis. Lymph : No Cervical lymphadenopathy, lichenification bilaterally       ASSESSMENT AND PLAN:  1. Lymphedema No surgery or intervention at this point in  time.    I have had a long discussion with the patient regarding venous insufficiency and why it  causes symptoms, specifically venous ulceration . I have discussed with the patient the chronic skin changes that accompany venous insufficiency and the long term sequela such as infection and recurring  ulceration.  Patient will be placed in [employer] which will be changed weekly drainage permitting.  In addition, behavioral modification including several periods of elevation of the lower extremities during the day will be continued. Achieving a position with the ankles at heart level was stressed to the patient  The patient is instructed to begin routine exercise, especially walking on a daily basis   2. Gastroesophageal reflux disease, esophagitis presence not specified Continue PPI as already ordered, this medication has been reviewed and there are no changes at this time.  Avoidence of caffeine and alcohol  Moderate elevation of the head of the bed   3. Type 2 diabetes mellitus with diabetic peripheral angiopathy without gangrene, without long-term current use of insulin (HCC) Continue hypoglycemic medications as already ordered, these medications have been reviewed and there are no changes at this time.  Hgb A1C to be monitored as already arranged by primary service    Current Outpatient Medications on File Prior to Visit  Medication Sig Dispense Refill  . ACCU-CHEK AVIVA PLUS test strip USE AS INSTRUCTED. USE  1 STRIP EVERY 3 HOURS . ACCU-CHECK AVIVA PLUS TEST STRIP. DX E10.649  12  . cyanocobalamin (,VITAMIN B-12,) 1000 MCG/ML injection Inject into the muscle.    . ferrous sulfate 325 (65 FE) MG tablet Take 325 mg by mouth daily.    Marland Kitchen GLUCAGON EMERGENCY 1 MG injection     . glucagon, human recombinant, (GLUCAGEN DIAGNOSTIC) 1 MG injection Use as directed    . glucose 4 GM chewable tablet Chew 1 tablet by mouth once as needed for low blood sugar.    Marland Kitchen glucose blood (PRECISION QID TEST) test strip Use 8 (eight) times daily as directed. BAYER CONTOUR NEXT TEST STRIPS E10.649    . insulin aspart (NOVOLOG) 100 UNIT/ML injection Basal rate 12am 0.8 units per hour, 9am 0.9 units per hour. (total basal insulin 20.7 units). Carbohydrate ratio 1 units for every 8gm of carbohydrate. Correction factor 1 units for every 50mg /dl over target cbg. Target CBG 80-120 1 vial 12  . levothyroxine (SYNTHROID, LEVOTHROID) 25 MCG tablet Take 25 mcg by mouth daily.    Marland Kitchen lisinopril (PRINIVIL,ZESTRIL) 20 MG tablet Take 20 mg by mouth daily.    . Multiple Vitamin (MULTIVITAMIN) tablet Take 1 tablet by mouth daily.    . pravastatin (PRAVACHOL) 40 MG tablet Take 1 tablet (40 mg total) by mouth at bedtime. Will need office visit for any more refills 30 tablet 0  . prochlorperazine (COMPAZINE) 10 MG tablet TAKE 1 TABLET (10 MG TOTAL) BY MOUTH EVERY 6 (SIX) HOURS AS NEEDED (NAUSEA OR VOMITING).    Marland Kitchen spironolactone (ALDACTONE) 25 MG tablet Take 25 mg by mouth daily.    . carvedilol (COREG) 3.125 MG tablet Take 3.125 mg by mouth 2 (two) times daily with a meal.     . furosemide (LASIX) 20 MG tablet Take 1 tablet (20 mg total) by mouth daily. 30 tablet 1   Current Facility-Administered Medications on File Prior to Visit  Medication Dose Route Frequency Provider Last Rate Last Dose  . heparin lock flush 100 unit/mL  500 Units Intravenous Once Lloyd Huger, MD      .  sodium chloride flush (NS) 0.9 % injection 10 mL  10 mL  Intravenous PRN Lloyd Huger, MD   10 mL at 02/01/18 0829  . sodium chloride flush (NS) 0.9 % injection 10 mL  10 mL Intravenous PRN Lloyd Huger, MD   10 mL at 04/05/18 0800    There are no Patient Instructions on file for this visit. No follow-ups on file.   Kris Hartmann, NP  This note was completed with Sales executive.  Any errors are purely unintentional.

## 2018-12-21 ENCOUNTER — Ambulatory Visit: Payer: Medicare Other | Admitting: Internal Medicine

## 2018-12-27 ENCOUNTER — Ambulatory Visit (INDEPENDENT_AMBULATORY_CARE_PROVIDER_SITE_OTHER): Payer: Medicare Other | Admitting: Nurse Practitioner

## 2018-12-27 ENCOUNTER — Encounter (INDEPENDENT_AMBULATORY_CARE_PROVIDER_SITE_OTHER): Payer: Self-pay

## 2018-12-27 ENCOUNTER — Other Ambulatory Visit: Payer: Self-pay

## 2018-12-27 VITALS — BP 113/66 | HR 96 | Resp 18 | Ht 69.0 in | Wt 254.0 lb

## 2018-12-27 DIAGNOSIS — I890 Lymphedema, not elsewhere classified: Secondary | ICD-10-CM

## 2018-12-27 NOTE — Progress Notes (Signed)
History of Present Illness  There is no documented history at this time  Assessments & Plan   There are no diagnoses linked to this encounter.    Additional instructions  Subjective:  Patient presents with venous ulcer of the Bilateral lower extremity.    Procedure:  3 layer unna wrap was placed Bilateral lower extremity.   Plan:   Follow up in one week.  

## 2018-12-28 DIAGNOSIS — E10649 Type 1 diabetes mellitus with hypoglycemia without coma: Secondary | ICD-10-CM | POA: Diagnosis not present

## 2018-12-28 DIAGNOSIS — I10 Essential (primary) hypertension: Secondary | ICD-10-CM | POA: Diagnosis not present

## 2018-12-28 DIAGNOSIS — E785 Hyperlipidemia, unspecified: Secondary | ICD-10-CM | POA: Diagnosis not present

## 2018-12-28 DIAGNOSIS — E1159 Type 2 diabetes mellitus with other circulatory complications: Secondary | ICD-10-CM | POA: Diagnosis not present

## 2018-12-28 DIAGNOSIS — E1069 Type 1 diabetes mellitus with other specified complication: Secondary | ICD-10-CM | POA: Diagnosis not present

## 2018-12-30 ENCOUNTER — Other Ambulatory Visit: Payer: Self-pay

## 2018-12-31 ENCOUNTER — Telehealth: Payer: Self-pay | Admitting: Oncology

## 2018-12-31 NOTE — Telephone Encounter (Signed)
Left patient a VM to contact office on Monday to discuss possible telemedicine visit in lieu of office visit for social distancing measures. Pt is scheduled to be seen on 3/25.

## 2019-01-01 ENCOUNTER — Other Ambulatory Visit: Payer: Self-pay

## 2019-01-02 ENCOUNTER — Other Ambulatory Visit: Payer: Self-pay

## 2019-01-02 ENCOUNTER — Encounter: Payer: Self-pay | Admitting: Podiatry

## 2019-01-02 ENCOUNTER — Inpatient Hospital Stay: Payer: Medicare Other | Attending: Oncology

## 2019-01-02 ENCOUNTER — Ambulatory Visit
Admission: RE | Admit: 2019-01-02 | Discharge: 2019-01-02 | Disposition: A | Discharge status: Home / Self Care | Payer: Medicare Other | Source: Ambulatory Visit | Attending: Oncology | Admitting: Oncology

## 2019-01-02 ENCOUNTER — Ambulatory Visit (INDEPENDENT_AMBULATORY_CARE_PROVIDER_SITE_OTHER): Payer: Medicare Other | Admitting: Podiatry

## 2019-01-02 ENCOUNTER — Other Ambulatory Visit: Payer: Self-pay | Admitting: Oncology

## 2019-01-02 DIAGNOSIS — E1122 Type 2 diabetes mellitus with diabetic chronic kidney disease: Secondary | ICD-10-CM | POA: Insufficient documentation

## 2019-01-02 DIAGNOSIS — Z9221 Personal history of antineoplastic chemotherapy: Secondary | ICD-10-CM | POA: Insufficient documentation

## 2019-01-02 DIAGNOSIS — E1142 Type 2 diabetes mellitus with diabetic polyneuropathy: Secondary | ICD-10-CM

## 2019-01-02 DIAGNOSIS — Z794 Long term (current) use of insulin: Secondary | ICD-10-CM | POA: Insufficient documentation

## 2019-01-02 DIAGNOSIS — C8318 Mantle cell lymphoma, lymph nodes of multiple sites: Secondary | ICD-10-CM

## 2019-01-02 DIAGNOSIS — M79675 Pain in left toe(s): Secondary | ICD-10-CM

## 2019-01-02 DIAGNOSIS — M79674 Pain in right toe(s): Secondary | ICD-10-CM | POA: Diagnosis not present

## 2019-01-02 DIAGNOSIS — B351 Tinea unguium: Secondary | ICD-10-CM | POA: Diagnosis not present

## 2019-01-02 DIAGNOSIS — D649 Anemia, unspecified: Secondary | ICD-10-CM | POA: Insufficient documentation

## 2019-01-02 DIAGNOSIS — N189 Chronic kidney disease, unspecified: Secondary | ICD-10-CM | POA: Insufficient documentation

## 2019-01-02 DIAGNOSIS — C8310 Mantle cell lymphoma, unspecified site: Secondary | ICD-10-CM | POA: Insufficient documentation

## 2019-01-02 DIAGNOSIS — D696 Thrombocytopenia, unspecified: Secondary | ICD-10-CM | POA: Insufficient documentation

## 2019-01-02 DIAGNOSIS — Z9225 Personal history of immunosupression therapy: Secondary | ICD-10-CM | POA: Insufficient documentation

## 2019-01-02 DIAGNOSIS — Z9641 Presence of insulin pump (external) (internal): Secondary | ICD-10-CM | POA: Insufficient documentation

## 2019-01-02 LAB — CBC WITH DIFFERENTIAL/PLATELET
Abs Immature Granulocytes: 0.04 10*3/uL (ref 0.00–0.07)
BASOS PCT: 1 %
Basophils Absolute: 0.1 10*3/uL (ref 0.0–0.1)
EOS ABS: 0.1 10*3/uL (ref 0.0–0.5)
Eosinophils Relative: 3 %
HCT: 32.4 % — ABNORMAL LOW (ref 39.0–52.0)
Hemoglobin: 10.7 g/dL — ABNORMAL LOW (ref 13.0–17.0)
Immature Granulocytes: 1 %
Lymphocytes Relative: 17 %
Lymphs Abs: 0.8 10*3/uL (ref 0.7–4.0)
MCH: 32 pg (ref 26.0–34.0)
MCHC: 33 g/dL (ref 30.0–36.0)
MCV: 97 fL (ref 80.0–100.0)
Monocytes Absolute: 0.6 10*3/uL (ref 0.1–1.0)
Monocytes Relative: 12 %
Neutro Abs: 3.2 10*3/uL (ref 1.7–7.7)
Neutrophils Relative %: 66 %
PLATELETS: 92 10*3/uL — AB (ref 150–400)
RBC: 3.34 MIL/uL — ABNORMAL LOW (ref 4.22–5.81)
RDW: 17 % — ABNORMAL HIGH (ref 11.5–15.5)
WBC: 4.8 10*3/uL (ref 4.0–10.5)
nRBC: 0 % (ref 0.0–0.2)

## 2019-01-02 LAB — COMPREHENSIVE METABOLIC PANEL
ALT: 24 U/L (ref 0–44)
AST: 22 U/L (ref 15–41)
Albumin: 3.6 g/dL (ref 3.5–5.0)
Alkaline Phosphatase: 126 U/L (ref 38–126)
Anion gap: 8 (ref 5–15)
BILIRUBIN TOTAL: 0.4 mg/dL (ref 0.3–1.2)
BUN: 35 mg/dL — ABNORMAL HIGH (ref 8–23)
CALCIUM: 7.8 mg/dL — AB (ref 8.9–10.3)
CO2: 17 mmol/L — ABNORMAL LOW (ref 22–32)
Chloride: 115 mmol/L — ABNORMAL HIGH (ref 98–111)
Creatinine, Ser: 2 mg/dL — ABNORMAL HIGH (ref 0.61–1.24)
GFR calc Af Amer: 38 mL/min — ABNORMAL LOW (ref >60)
GFR calc non Af Amer: 33 mL/min — ABNORMAL LOW (ref >60)
Glucose, Bld: 60 mg/dL — ABNORMAL LOW (ref 70–99)
Potassium: 3.8 mmol/L (ref 3.5–5.1)
Sodium: 140 mmol/L (ref 135–145)
Total Protein: 6.7 g/dL (ref 6.5–8.1)

## 2019-01-02 NOTE — Progress Notes (Signed)
Complaint:  Visit Type: Patient returns to my office for continued preventative foot care services. Complaint: Patient states" my nails have grown long and thick and become painful to walk and wear shoes" Patient has been diagnosed with DM with no foot complications. Patient is wearing unna boots on both legs and feet. He says these are changed weekly and is due to have them changed Tuesday.  Patient nails and toes are dirty since he is unable to bathe because of the unna boots  B/L.  The patient presents for preventative foot care services. No changes to ROS  Podiatric Exam: Vascular: deferred this visit.  Sensorium: Deferred this visit. Nail Exam: Pt has thick disfigured discolored nails with subungual debris noted bilateral entire nail hallux through fifth toenails Ulcer Exam: There is no evidence of ulcer or pre-ulcerative changes or infection. Orthopedic Exam: Muscle tone and strength are WNL. No limitations in general ROM. No crepitus or effusions noted. Foot type and digits show no abnormalities. Bony prominences are unremarkable. Skin: No Porokeratosis. No infection or ulcers  Diagnosis:  Onychomycosis, , Pain in right toe, pain in left toes  Treatment & Plan Procedures and Treatment: Consent by patient was obtained for treatment procedures.   Debridement of mycotic and hypertrophic toenails, 1 through 5 bilateral and clearing of subungual debris. No ulceration, no infection noted. ABN signed for 2019. Return Visit-Office Procedure: Patient instructed to return to the office for a follow up visit 3 months for continued evaluation and treatment.    [name] DPM

## 2019-01-03 ENCOUNTER — Ambulatory Visit (INDEPENDENT_AMBULATORY_CARE_PROVIDER_SITE_OTHER): Payer: Medicare Other | Admitting: Nurse Practitioner

## 2019-01-03 ENCOUNTER — Other Ambulatory Visit: Payer: Self-pay

## 2019-01-03 ENCOUNTER — Encounter (INDEPENDENT_AMBULATORY_CARE_PROVIDER_SITE_OTHER): Payer: Self-pay

## 2019-01-03 VITALS — BP 119/68 | HR 76 | Resp 16 | Ht 69.0 in | Wt 256.0 lb

## 2019-01-03 DIAGNOSIS — I890 Lymphedema, not elsewhere classified: Secondary | ICD-10-CM | POA: Diagnosis not present

## 2019-01-03 DIAGNOSIS — L97919 Non-pressure chronic ulcer of unspecified part of right lower leg with unspecified severity: Secondary | ICD-10-CM | POA: Diagnosis not present

## 2019-01-03 DIAGNOSIS — L97929 Non-pressure chronic ulcer of unspecified part of left lower leg with unspecified severity: Secondary | ICD-10-CM | POA: Diagnosis not present

## 2019-01-03 NOTE — Progress Notes (Signed)
History of Present Illness  There is no documented history at this time  Assessments & Plan   There are no diagnoses linked to this encounter.    Additional instructions  Subjective:  Patient presents with venous ulcer of the Bilateral lower extremity.    Procedure:  3 layer unna wrap was placed Bilateral lower extremity.   Plan:   Follow up in one week.  

## 2019-01-04 ENCOUNTER — Inpatient Hospital Stay (HOSPITAL_BASED_OUTPATIENT_CLINIC_OR_DEPARTMENT_OTHER): Payer: Medicare Other | Admitting: Oncology

## 2019-01-04 DIAGNOSIS — C8310 Mantle cell lymphoma, unspecified site: Secondary | ICD-10-CM

## 2019-01-04 DIAGNOSIS — Z09 Encounter for follow-up examination after completed treatment for conditions other than malignant neoplasm: Secondary | ICD-10-CM

## 2019-01-04 NOTE — Progress Notes (Signed)
Virtual Visit via Telephone Note  I connected with [name] on 01/04/19 at 10:45 AM EDT by telephone and verified that I am speaking with the correct person using two identifiers.   I discussed the limitations, risks, security and privacy concerns of performing an evaluation and management service by telephone and the availability of in person appointments. I also discussed with the patient that there may be a patient responsible charge related to this service. The patient expressed understanding and agreed to proceed.   History of Present Illness: Patient called in the clinic today for routine 22-month evaluation and discussion of his imaging results.  He continues to feel well and remains asymptomatic.  He denies any recent fevers or illnesses.  He denies any night sweats or unintentional weight loss.  He has no neurologic complaints.  He denies any chest pain or shortness of breath.  He has no nausea, vomiting, constipation, or diarrhea.  He has no urinary complaints.  Patient reports he is at his baseline offers no specific complaints today.   Observations/Objective:  CT scan results from January 02, 2019 for restaging of mantle cell lymphoma were discussed at length with the patient and revealed no evidence of recurrent or progressive disease.  Patient last received treatment with Rituxan and Treanda on Mar 09, 2018.  He continues to have a mild anemia with a hemoglobin of 10.7 that is chronic and unchanged.  Platelet count is 92 and has trended down over the last several months.  Finally, his creatinine has trended up and is 2.0.   Assessment and Plan:  1. Mantle cell lymphoma, stage III: Patient's initial diagnosis was on inguinal lymph node biopsy on July 12, 2014.  Patient was noted to have progressive disease and underwent chemotherapy using Rituxan and Treanda completing cycle 4 on Mar 09, 2018.  CT scan results from January 02, 2019 were reviewed independently and discussed at length  with the patient revealing no evidence of recurrent or progressive disease.  No intervention is needed at this time.  Return to clinic in 3 months for laboratory work only and then in 6 months with laboratory work, repeat imaging, and further evaluation. 2. Decreased ejection fraction: Chronic and unchanged.  Previously patient had an ejection fraction of 40% with no clinical CHF.  Continue monitoring and evaluation per cardiology. 3. Diabetes: Patient has improved blood glucose control. Continue insulin pump as directed. 4. Thrombocytopenia:  Platelets have trended down slightly to 92.  Monitor. 5.  Chronic renal insufficiency: Patient reports he recently had an appointment with nephrology.  Follow Up Instructions:  3 months: lab only. 6 months: ct c/a/p with contrast and lab. see FINN 1-2 day later.    I discussed the assessment and treatment plan with the patient. The patient was provided an opportunity to ask questions and all were answered. The patient agreed with the plan and demonstrated an understanding of the instructions.   The patient was advised to call back or seek an in-person evaluation if the symptoms worsen or if the condition fails to improve as anticipated.  I provided 25 minutes of non-face-to-face time during this encounter.   Lloyd Huger, MD

## 2019-01-04 NOTE — Progress Notes (Signed)
Verified patient name and DOB for virtual visit via telephone.    Patient follow up for history of mantle cell lymphoma and does not offer any concerns today.

## 2019-01-05 ENCOUNTER — Encounter (INDEPENDENT_AMBULATORY_CARE_PROVIDER_SITE_OTHER): Payer: Self-pay | Admitting: Nurse Practitioner

## 2019-01-05 ENCOUNTER — Other Ambulatory Visit: Payer: Self-pay | Admitting: Oncology

## 2019-01-05 DIAGNOSIS — C8318 Mantle cell lymphoma, lymph nodes of multiple sites: Secondary | ICD-10-CM

## 2019-01-10 ENCOUNTER — Ambulatory Visit (INDEPENDENT_AMBULATORY_CARE_PROVIDER_SITE_OTHER): Payer: Medicare Other | Admitting: Nurse Practitioner

## 2019-01-10 ENCOUNTER — Encounter (INDEPENDENT_AMBULATORY_CARE_PROVIDER_SITE_OTHER): Payer: Self-pay | Admitting: Nurse Practitioner

## 2019-01-10 ENCOUNTER — Other Ambulatory Visit: Payer: Self-pay

## 2019-01-10 VITALS — BP 104/57 | HR 92 | Resp 16 | Wt 258.0 lb

## 2019-01-10 DIAGNOSIS — K219 Gastro-esophageal reflux disease without esophagitis: Secondary | ICD-10-CM

## 2019-01-10 DIAGNOSIS — Z794 Long term (current) use of insulin: Secondary | ICD-10-CM | POA: Diagnosis not present

## 2019-01-10 DIAGNOSIS — I890 Lymphedema, not elsewhere classified: Secondary | ICD-10-CM

## 2019-01-10 DIAGNOSIS — E1151 Type 2 diabetes mellitus with diabetic peripheral angiopathy without gangrene: Secondary | ICD-10-CM

## 2019-01-10 DIAGNOSIS — Z79899 Other long term (current) drug therapy: Secondary | ICD-10-CM | POA: Diagnosis not present

## 2019-01-11 ENCOUNTER — Encounter (INDEPENDENT_AMBULATORY_CARE_PROVIDER_SITE_OTHER): Payer: Self-pay | Admitting: Nurse Practitioner

## 2019-01-11 NOTE — Progress Notes (Signed)
SUBJECTIVE:  [name], male    DOB: 06-04-1946, 73 y.o.   MRN: [id number] Chief Complaint  Patient presents with  . Follow-up    unna boot check    HPI  Gary [name] is a 73 y.o. male the presents today for evaluation of lower extremities following bilateral Unna wraps.  The patient still has significant swelling as well as early ulcerative changes.  The patient does experience some difficulty with elevating his legs which may contribute to the continued swelling.  He denies any pain in his lower extremities.  He denies any shortness of breath or chest pain.  He denies any fever, chills, nausea, vomiting or diarrhea.  He denies any TIA-like symptoms or amaurosis fugax.  Past Medical History:  Diagnosis Date  . Depression   . 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 Gastroparesis   . Graves disease   . Hypercholesterolemia   . Hypertension   . Mantle cell lymphoma ([place]) 07/2014  . Peripheral neuropathy     Past Surgical History:  Procedure Laterality Date  . CHOLECYSTECTOMY    . NEPHRECTOMY     right  . PACEMAKER INSERTION N/A 12/07/2017   Procedure: INSERTION PACEMAKER DUAL CHAMBER INITIAL INSERT;  Surgeon: Isaias Cowman, MD;  Location: [facility];  Service: Cardiovascular;  Laterality: N/A;  . PORTA CATH INSERTION N/A 11/03/2017   Procedure: PORTA CATH INSERTION;  Surgeon: Katha Cabal, MD;  Location: [place] [facility];  Service: Cardiovascular;  Laterality: N/A;    Social History   Socioeconomic History  . Marital status: Married    Spouse name: Not on file  . Number of children: 1  . Years of education: Not on file  . Highest education level: Not on file  Occupational History  . Not on file  Social Needs  . Financial resource strain: Not on file  . Food insecurity:    Worry: Not on file    Inability: Not on file  . Transportation needs:    Medical: Not on file    Non-medical:  Not on file  Tobacco Use  . Smoking status: Former Research scientist (life sciences)  . Smokeless tobacco: Current User    Types: Chew  Substance and Sexual Activity  . Alcohol use: Yes    Alcohol/week: 3.0 standard drinks    Types: 3 Cans of beer per week    Comment: nightly  . Drug use: Yes    Types: Codeine  . Sexual activity: Not Currently  Lifestyle  . Physical activity:    Days per week: Not on file    Minutes per session: Not on file  . Stress: Not on file  Relationships  . Social connections:    Talks on phone: Not on file    Gets together: Not on file    Attends religious service: Not on file    Active member of club or organization: Not on file    Attends meetings of clubs or organizations: Not on file    Relationship status: Not on file  . Intimate partner violence:    Fear of current or ex partner: Not on file    Emotionally abused: Not on file    Physically abused: Not on file    Forced sexual activity: Not on file  Other Topics Concern  . Not on file  Social History Narrative  . Not on file    Family History  Problem Relation Age of  Onset  . Breast cancer Mother   . Diabetes Father   . Cancer Father        Lymphoma  . Alcohol abuse Maternal Uncle   . Diabetes Sister   . Heart disease Other        grandfather    Allergies  Allergen Reactions  . Rofecoxib Nausea Only     Review of Systems   Review of Systems: Negative Unless Checked Constitutional: [] Weight loss  [] Fever  [] Chills Cardiac: [] Chest pain   []  Atrial Fibrillation  [] Palpitations   [] Shortness of breath when laying flat   [] Shortness of breath with exertion. [] Shortness of breath at rest Vascular:  [] Pain in legs with walking   [] Pain in legs with standing [] Pain in legs when laying flat   [] Claudication    [] Pain in feet when laying flat    [] History of DVT   [] Phlebitis   [x] Swelling in legs   [] Varicose veins   [] Non-healing ulcers Pulmonary:   [] Uses home oxygen   [] Productive cough   [] Hemoptysis    [] Wheeze  [] COPD   [] Asthma Neurologic:  [] Dizziness   [] Seizures  [] Blackouts [] History of stroke   [] History of TIA  [] Aphasia   [] Temporary Blindness   [] Weakness or numbness in arm   [] Weakness or numbness in leg Musculoskeletal:   [] Joint swelling   [] Joint pain   [] Low back pain  []  History of Knee Replacement [] Arthritis [] back Surgeries  []  Spinal Stenosis    Hematologic:  [] Easy bruising  [] Easy bleeding   [] Hypercoagulable state   [] Anemic Gastrointestinal:  [] Diarrhea   [] Vomiting  [x] Gastroesophageal reflux/heartburn   [] Difficulty swallowing. [] Abdominal pain Genitourinary:  [x] Chronic kidney disease   [] Difficult urination  [] Anuric   [] Blood in urine [] Frequent urination  [] Burning with urination   [] Hematuria Skin:  [] Rashes   [] Ulcers [] Wounds Psychological:  [] History of anxiety   []  History of major depression  []  Memory Difficulties      OBJECTIVE:   Physical Exam  BP (!) 104/57 (BP Location: Right Arm)   Pulse 92   Resp 16   Wt 258 lb (117 kg)   BMI 38.10 kg/m   Gen: WD/WN, NAD Head: [place]/AT, No temporalis wasting.  Ear/Nose/Throat: Hearing grossly intact, nares w/o erythema or drainage Eyes: PER, EOMI, sclera nonicteric.  Neck: Supple, no masses.  No JVD.  Pulmonary:  Good air movement, no use of accessory muscles.  Cardiac: RRR Vascular: 2+ edema bilaterally  Vessel Right Left  Radial Palpable Palpable  Dorsalis Pedis Palpable Palpable  Posterior Tibial Palpable Palpable   Gastrointestinal: soft, non-distended. No guarding/no peritoneal signs.  Musculoskeletal: M/S 5/5 throughout.  No deformity or atrophy.  Neurologic: Pain and light touch intact in extremities.  Symmetrical.  Speech is fluent. Motor exam as listed above. Psychiatric: Judgment intact, Mood & affect appropriate for pt's clinical situation. Dermatologic: No Venous rashes. No Ulcers Noted.  No changes consistent with cellulitis. Lymph : No Cervical lymphadenopathy, no lichenification or skin  changes of chronic lymphedema.       ASSESSMENT AND PLAN:  1. Lymphedema The patient will remain in bilateral Unna wraps at this time.  He has several areas that appear like they may be close to ulceration.  Patient reminded that he should elevate as much as possible to assist with lymphedema control.  He will return to the office for weekly unna wraps and reassessment in 4 weeks.    2. Gastroesophageal reflux disease, esophagitis presence not specified Continue PPI as already  ordered, this medication has been reviewed and there are no changes at this time.  Avoidence of caffeine and alcohol  Moderate elevation of the head of the bed   3. Type 2 diabetes mellitus with diabetic peripheral angiopathy without gangrene, without long-term current use of insulin (HCC) Continue hypoglycemic medications as already ordered, these medications have been reviewed and there are no changes at this time.  Hgb A1C to be monitored as already arranged by primary service    Current Outpatient Medications on File Prior to Visit  Medication Sig Dispense Refill  . ACCU-CHEK AVIVA PLUS test strip USE AS INSTRUCTED. USE 1 STRIP EVERY 3 HOURS . ACCU-CHECK AVIVA PLUS TEST STRIP. DX E10.649  12  . cyanocobalamin (,VITAMIN B-12,) 1000 MCG/ML injection Inject into the muscle.    . ferrous sulfate 325 (65 FE) MG tablet Take 325 mg by mouth daily.    Marland Kitchen GLUCAGON EMERGENCY 1 MG injection     . glucagon, human recombinant, (GLUCAGEN DIAGNOSTIC) 1 MG injection Use as directed    . glucose 4 GM chewable tablet Chew 1 tablet by mouth once as needed for low blood sugar.    Marland Kitchen glucose blood (PRECISION QID TEST) test strip Use 8 (eight) times daily as directed. BAYER CONTOUR NEXT TEST STRIPS E10.649    . insulin aspart (NOVOLOG) 100 UNIT/ML injection Basal rate 12am 0.8 units per hour, 9am 0.9 units per hour. (total basal insulin 20.7 units). Carbohydrate ratio 1 units for every 8gm of carbohydrate. Correction factor 1 units  for every 50mg /dl over target cbg. Target CBG 80-120 1 vial 12  . levothyroxine (SYNTHROID, LEVOTHROID) 25 MCG tablet Take 25 mcg by mouth daily.    Marland Kitchen lisinopril (PRINIVIL,ZESTRIL) 20 MG tablet Take 20 mg by mouth daily.    . Multiple Vitamin (MULTIVITAMIN) tablet Take 1 tablet by mouth daily.    . pravastatin (PRAVACHOL) 40 MG tablet Take 1 tablet (40 mg total) by mouth at bedtime. Will need office visit for any more refills 30 tablet 0  . prochlorperazine (COMPAZINE) 10 MG tablet TAKE 1 TABLET (10 MG TOTAL) BY MOUTH EVERY 6 (SIX) HOURS AS NEEDED (NAUSEA OR VOMITING).    Marland Kitchen spironolactone (ALDACTONE) 25 MG tablet Take 25 mg by mouth daily.    . carvedilol (COREG) 3.125 MG tablet Take 3.125 mg by mouth 2 (two) times daily with a meal.     . furosemide (LASIX) 20 MG tablet Take 1 tablet (20 mg total) by mouth daily. 30 tablet 1   Current Facility-Administered Medications on File Prior to Visit  Medication Dose Route Frequency Provider Last Rate Last Dose  . heparin lock flush 100 unit/mL  500 Units Intravenous Once Lloyd Huger, MD      . sodium chloride flush (NS) 0.9 % injection 10 mL  10 mL Intravenous PRN Lloyd Huger, MD   10 mL at 02/01/18 0829  . sodium chloride flush (NS) 0.9 % injection 10 mL  10 mL Intravenous PRN Lloyd Huger, MD   10 mL at 04/05/18 0800    There are no Patient Instructions on file for this visit. No follow-ups on file.   Kris Hartmann, NP  This note was completed with Sales executive.  Any errors are purely unintentional.

## 2019-01-17 ENCOUNTER — Other Ambulatory Visit: Payer: Self-pay

## 2019-01-17 ENCOUNTER — Encounter (INDEPENDENT_AMBULATORY_CARE_PROVIDER_SITE_OTHER): Payer: Self-pay

## 2019-01-17 ENCOUNTER — Ambulatory Visit (INDEPENDENT_AMBULATORY_CARE_PROVIDER_SITE_OTHER): Payer: Medicare Other | Admitting: Nurse Practitioner

## 2019-01-17 VITALS — BP 116/62 | HR 91 | Resp 18 | Wt 259.0 lb

## 2019-01-17 DIAGNOSIS — I890 Lymphedema, not elsewhere classified: Secondary | ICD-10-CM

## 2019-01-17 DIAGNOSIS — L97919 Non-pressure chronic ulcer of unspecified part of right lower leg with unspecified severity: Secondary | ICD-10-CM | POA: Diagnosis not present

## 2019-01-17 DIAGNOSIS — L97929 Non-pressure chronic ulcer of unspecified part of left lower leg with unspecified severity: Secondary | ICD-10-CM | POA: Diagnosis not present

## 2019-01-17 DIAGNOSIS — I5022 Chronic systolic (congestive) heart failure: Secondary | ICD-10-CM | POA: Diagnosis not present

## 2019-01-17 NOTE — Progress Notes (Signed)
History of Present Illness  There is no documented history at this time  Assessments & Plan   There are no diagnoses linked to this encounter.    Additional instructions  Subjective:  Patient presents with venous ulcer of the Bilateral lower extremity.    Procedure:  3 layer unna wrap was placed Bilateral lower extremity.   Plan:   Follow up in one week.  

## 2019-01-23 ENCOUNTER — Encounter (INDEPENDENT_AMBULATORY_CARE_PROVIDER_SITE_OTHER): Payer: Self-pay | Admitting: Nurse Practitioner

## 2019-01-23 DIAGNOSIS — R600 Localized edema: Secondary | ICD-10-CM | POA: Diagnosis not present

## 2019-01-23 DIAGNOSIS — I5022 Chronic systolic (congestive) heart failure: Secondary | ICD-10-CM | POA: Diagnosis not present

## 2019-01-23 DIAGNOSIS — I2510 Atherosclerotic heart disease of native coronary artery without angina pectoris: Secondary | ICD-10-CM | POA: Diagnosis not present

## 2019-01-23 DIAGNOSIS — I6523 Occlusion and stenosis of bilateral carotid arteries: Secondary | ICD-10-CM | POA: Diagnosis not present

## 2019-01-23 DIAGNOSIS — I493 Ventricular premature depolarization: Secondary | ICD-10-CM | POA: Diagnosis not present

## 2019-01-23 DIAGNOSIS — Z6841 Body Mass Index (BMI) 40.0 and over, adult: Secondary | ICD-10-CM | POA: Diagnosis not present

## 2019-01-24 ENCOUNTER — Ambulatory Visit (INDEPENDENT_AMBULATORY_CARE_PROVIDER_SITE_OTHER): Payer: Medicare Other | Admitting: Nurse Practitioner

## 2019-01-24 ENCOUNTER — Other Ambulatory Visit: Payer: Self-pay

## 2019-01-24 ENCOUNTER — Encounter (INDEPENDENT_AMBULATORY_CARE_PROVIDER_SITE_OTHER): Payer: Self-pay

## 2019-01-24 VITALS — BP 114/69 | HR 64 | Resp 16 | Wt 261.6 lb

## 2019-01-24 DIAGNOSIS — L97929 Non-pressure chronic ulcer of unspecified part of left lower leg with unspecified severity: Secondary | ICD-10-CM

## 2019-01-24 DIAGNOSIS — L97919 Non-pressure chronic ulcer of unspecified part of right lower leg with unspecified severity: Secondary | ICD-10-CM

## 2019-01-24 DIAGNOSIS — I890 Lymphedema, not elsewhere classified: Secondary | ICD-10-CM

## 2019-01-24 NOTE — Progress Notes (Signed)
History of Present Illness  There is no documented history at this time  Assessments & Plan   There are no diagnoses linked to this encounter.    Additional instructions  Subjective:  Patient presents with venous ulcer of the Bilateral lower extremity.    Procedure:  3 layer unna wrap was placed Bilateral lower extremity.   Plan:   Follow up in one week.  

## 2019-01-31 ENCOUNTER — Encounter (INDEPENDENT_AMBULATORY_CARE_PROVIDER_SITE_OTHER): Payer: Self-pay | Admitting: Nurse Practitioner

## 2019-01-31 ENCOUNTER — Other Ambulatory Visit: Payer: Self-pay

## 2019-01-31 ENCOUNTER — Ambulatory Visit (INDEPENDENT_AMBULATORY_CARE_PROVIDER_SITE_OTHER): Payer: Medicare Other | Admitting: Nurse Practitioner

## 2019-01-31 VITALS — BP 145/71 | HR 83 | Resp 16 | Ht 69.0 in | Wt 262.0 lb

## 2019-01-31 DIAGNOSIS — E1151 Type 2 diabetes mellitus with diabetic peripheral angiopathy without gangrene: Secondary | ICD-10-CM | POA: Diagnosis not present

## 2019-01-31 DIAGNOSIS — Z79899 Other long term (current) drug therapy: Secondary | ICD-10-CM | POA: Diagnosis not present

## 2019-01-31 DIAGNOSIS — K219 Gastro-esophageal reflux disease without esophagitis: Secondary | ICD-10-CM

## 2019-01-31 DIAGNOSIS — I890 Lymphedema, not elsewhere classified: Secondary | ICD-10-CM | POA: Diagnosis not present

## 2019-01-31 NOTE — Progress Notes (Signed)
SUBJECTIVE:  [name], male    DOB: [birth year], 72 y.o.   MRN: [id number] Chief Complaint  Patient presents with  . Follow-up    4week unna check    HPI  Gary [name] is a 73 y.o. male that presents today for Unna wrap check.  The patient swelling appears much better however it appears that he had a blister to rupture under the Unna wraps.  He has been attempting to elevate his lower extremities more which likely contributes to the decrease in swelling.  He denies any pain in his lower extremities.  He denies any shortness of breath or chest pain he denies any fever, chills, nausea, vomiting or diarrhea.  Past Medical History:  Diagnosis Date  . Depression   . 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 Gastroparesis   . Graves disease   . Hypercholesterolemia   . Hypertension   . Mantle cell lymphoma ([place]) 07/2014  . Peripheral neuropathy     Past Surgical History:  Procedure Laterality Date  . CHOLECYSTECTOMY    . NEPHRECTOMY     right  . PACEMAKER INSERTION N/A 12/07/2017   Procedure: INSERTION PACEMAKER DUAL CHAMBER INITIAL INSERT;  Surgeon: Isaias Cowman, MD;  Location: [facility];  Service: Cardiovascular;  Laterality: N/A;  . PORTA CATH INSERTION N/A 11/03/2017   Procedure: PORTA CATH INSERTION;  Surgeon: Katha Cabal, MD;  Location: [place] [facility];  Service: Cardiovascular;  Laterality: N/A;    Social History   Socioeconomic History  . Marital status: Married    Spouse name: Not on file  . Number of children: 1  . Years of education: Not on file  . Highest education level: Not on file  Occupational History  . Not on file  Social Needs  . Financial resource strain: Not on file  . Food insecurity:    Worry: Not on file    Inability: Not on file  . Transportation needs:    Medical: Not on file    Non-medical: Not on file  Tobacco Use  . Smoking status: Former Research scientist (life sciences)  .  Smokeless tobacco: Current User    Types: Chew  Substance and Sexual Activity  . Alcohol use: Yes    Alcohol/week: 3.0 standard drinks    Types: 3 Cans of beer per week    Comment: nightly  . Drug use: Yes    Types: Codeine  . Sexual activity: Not Currently  Lifestyle  . Physical activity:    Days per week: Not on file    Minutes per session: Not on file  . Stress: Not on file  Relationships  . Social connections:    Talks on phone: Not on file    Gets together: Not on file    Attends religious service: Not on file    Active member of club or organization: Not on file    Attends meetings of clubs or organizations: Not on file    Relationship status: Not on file  . Intimate partner violence:    Fear of current or ex partner: Not on file    Emotionally abused: Not on file    Physically abused: Not on file    Forced sexual activity: Not on file  Other Topics Concern  . Not on file  Social History Narrative  . Not on file    Family History  Problem Relation Age of Onset  . Breast cancer Mother   .  Diabetes Father   . Cancer Father        Lymphoma  . Alcohol abuse Maternal Uncle   . Diabetes Sister   . Heart disease Other        grandfather    Allergies  Allergen Reactions  . Rofecoxib Nausea Only     Review of Systems   Review of Systems: Negative Unless Checked Constitutional: [] Weight loss  [] Fever  [] Chills Cardiac: [] Chest pain   []  Atrial Fibrillation  [] Palpitations   [] Shortness of breath when laying flat   [] Shortness of breath with exertion. [] Shortness of breath at rest Vascular:  [] Pain in legs with walking   [] Pain in legs with standing [] Pain in legs when laying flat   [] Claudication    [] Pain in feet when laying flat    [] History of DVT   [] Phlebitis   [x] Swelling in legs   [] Varicose veins   [] Non-healing ulcers Pulmonary:   [] Uses home oxygen   [] Productive cough   [] Hemoptysis   [] Wheeze  [] COPD   [] Asthma Neurologic:  [] Dizziness   [] Seizures   [] Blackouts [] History of stroke   [] History of TIA  [] Aphasia   [] Temporary Blindness   [] Weakness or numbness in arm   [] Weakness or numbness in leg Musculoskeletal:   [] Joint swelling   [] Joint pain   [] Low back pain  []  History of Knee Replacement [] Arthritis [] back Surgeries  []  Spinal Stenosis    Hematologic:  [] Easy bruising  [] Easy bleeding   [] Hypercoagulable state   [] Anemic Gastrointestinal:  [] Diarrhea   [] Vomiting  [x] Gastroesophageal reflux/heartburn   [] Difficulty swallowing. [] Abdominal pain Genitourinary:  [x] Chronic kidney disease   [] Difficult urination  [] Anuric   [] Blood in urine [] Frequent urination  [] Burning with urination   [] Hematuria Skin:  [] Rashes   [] Ulcers [] Wounds Psychological:  [] History of anxiety   []  History of major depression  []  Memory Difficulties      OBJECTIVE:   Physical Exam  BP (!) 145/71 (BP Location: Right Arm)   Pulse 83   Resp 16   Ht 5\' 9"  (1.753 m)   Wt 262 lb (118.8 kg)   BMI 38.69 kg/m   Gen: WD/WN, NAD Head: [place]/AT, No temporalis wasting.  Ear/Nose/Throat: Hearing grossly intact, nares w/o erythema or drainage Eyes: PER, EOMI, sclera nonicteric.  Neck: Supple, no masses.  No JVD.  Pulmonary:  Good air movement, no use of accessory muscles.  Cardiac: RRR Vascular:  Vessel Right Left  Radial Palpable Palpable  Dorsalis Pedis Palpable Palpable  Posterior Tibial Palpable Palpable   Gastrointestinal: soft, non-distended. No guarding/no peritoneal signs.  Musculoskeletal: M/S 5/5 throughout.  No deformity or atrophy.  Neurologic: Pain and light touch intact in extremities.  Symmetrical.  Speech is fluent. Motor exam as listed above. Psychiatric: Judgment intact, Mood & affect appropriate for pt's clinical situation. Dermatologic:  Bilateral stasis dermatitis. no Ulcers Noted.  No changes consistent with cellulitis. Lymph : No Cervical lymphadenopathy, lichenification bilaterally      ASSESSMENT AND PLAN:  1. Lymphedema We  will continue to wrap the patient in wraps.  We will have him return in 4 weeks for provider evaluation.  If the patient's legs continue to look well and the swelling is decreased we may do a Unna wrap holiday to see if that helps the patient's skin and redness.  2. Gastroesophageal reflux disease, esophagitis presence not specified Continue PPI as already ordered, this medication has been reviewed and there are no changes at this time.  Avoidence of caffeine  and alcohol  Moderate elevation of the head of the bed   3. Type 2 diabetes mellitus with diabetic peripheral angiopathy without gangrene, without long-term current use of insulin (HCC) Continue hypoglycemic medications as already ordered, these medications have been reviewed and there are no changes at this time.  Hgb A1C to be monitored as already arranged by primary service    Current Outpatient Medications on File Prior to Visit  Medication Sig Dispense Refill  . ACCU-CHEK AVIVA PLUS test strip USE AS INSTRUCTED. USE 1 STRIP EVERY 3 HOURS . ACCU-CHECK AVIVA PLUS TEST STRIP. DX E10.649  12  . cyanocobalamin (,VITAMIN B-12,) 1000 MCG/ML injection Inject into the muscle.    . ferrous sulfate 325 (65 FE) MG tablet Take 325 mg by mouth daily.    Marland Kitchen GLUCAGON EMERGENCY 1 MG injection     . glucagon, human recombinant, (GLUCAGEN DIAGNOSTIC) 1 MG injection Use as directed    . glucose 4 GM chewable tablet Chew 1 tablet by mouth once as needed for low blood sugar.    Marland Kitchen glucose blood (PRECISION QID TEST) test strip Use 8 (eight) times daily as directed. BAYER CONTOUR NEXT TEST STRIPS E10.649    . insulin aspart (NOVOLOG) 100 UNIT/ML injection Basal rate 12am 0.8 units per hour, 9am 0.9 units per hour. (total basal insulin 20.7 units). Carbohydrate ratio 1 units for every 8gm of carbohydrate. Correction factor 1 units for every 50mg /dl over target cbg. Target CBG 80-120 1 vial 12  . levothyroxine (SYNTHROID, LEVOTHROID) 25 MCG tablet Take 25  mcg by mouth daily.    Marland Kitchen lisinopril (PRINIVIL,ZESTRIL) 20 MG tablet Take 20 mg by mouth daily.    . Multiple Vitamin (MULTIVITAMIN) tablet Take 1 tablet by mouth daily.    . pravastatin (PRAVACHOL) 40 MG tablet Take 1 tablet (40 mg total) by mouth at bedtime. Will need office visit for any more refills 30 tablet 0  . prochlorperazine (COMPAZINE) 10 MG tablet TAKE 1 TABLET (10 MG TOTAL) BY MOUTH EVERY 6 (SIX) HOURS AS NEEDED (NAUSEA OR VOMITING).    Marland Kitchen spironolactone (ALDACTONE) 25 MG tablet Take 25 mg by mouth daily.    . carvedilol (COREG) 3.125 MG tablet Take 3.125 mg by mouth 2 (two) times daily with a meal.     . furosemide (LASIX) 20 MG tablet Take 1 tablet (20 mg total) by mouth daily. 30 tablet 1   Current Facility-Administered Medications on File Prior to Visit  Medication Dose Route Frequency Provider Last Rate Last Dose  . heparin lock flush 100 unit/mL  500 Units Intravenous Once Lloyd Huger, MD      . sodium chloride flush (NS) 0.9 % injection 10 mL  10 mL Intravenous PRN Lloyd Huger, MD   10 mL at 02/01/18 0829  . sodium chloride flush (NS) 0.9 % injection 10 mL  10 mL Intravenous PRN Lloyd Huger, MD   10 mL at 04/05/18 0800    There are no Patient Instructions on file for this visit. No follow-ups on file.   Kris Hartmann, NP  This note was completed with Sales executive.  Any errors are purely unintentional.

## 2019-02-07 ENCOUNTER — Ambulatory Visit (INDEPENDENT_AMBULATORY_CARE_PROVIDER_SITE_OTHER): Payer: Medicare Other | Admitting: Vascular Surgery

## 2019-02-07 ENCOUNTER — Other Ambulatory Visit: Payer: Self-pay

## 2019-02-07 ENCOUNTER — Encounter (INDEPENDENT_AMBULATORY_CARE_PROVIDER_SITE_OTHER): Payer: Self-pay

## 2019-02-07 VITALS — BP 118/74 | HR 71 | Resp 16 | Wt 265.0 lb

## 2019-02-07 DIAGNOSIS — L97919 Non-pressure chronic ulcer of unspecified part of right lower leg with unspecified severity: Secondary | ICD-10-CM

## 2019-02-07 DIAGNOSIS — L97929 Non-pressure chronic ulcer of unspecified part of left lower leg with unspecified severity: Secondary | ICD-10-CM

## 2019-02-07 DIAGNOSIS — I890 Lymphedema, not elsewhere classified: Secondary | ICD-10-CM

## 2019-02-07 DIAGNOSIS — M7989 Other specified soft tissue disorders: Secondary | ICD-10-CM | POA: Diagnosis not present

## 2019-02-07 NOTE — Progress Notes (Signed)
History of Present Illness  Swelling and LE calf ulcerations  Assessments & Plan   Swelling and LE calf ulcerations, lymphedema    Additional instructions  Subjective:  Patient presents with venous ulcer of the Bilateral lower extremity.    Procedure:  3 layer unna wrap was placed Bilateral lower extremity.   Plan:   Follow up in one week.

## 2019-02-14 ENCOUNTER — Ambulatory Visit (INDEPENDENT_AMBULATORY_CARE_PROVIDER_SITE_OTHER): Payer: Medicare Other | Admitting: Nurse Practitioner

## 2019-02-14 ENCOUNTER — Other Ambulatory Visit: Payer: Self-pay

## 2019-02-14 ENCOUNTER — Encounter (INDEPENDENT_AMBULATORY_CARE_PROVIDER_SITE_OTHER): Payer: Self-pay

## 2019-02-14 VITALS — BP 118/73 | HR 76 | Resp 16 | Ht 69.0 in | Wt 266.0 lb

## 2019-02-14 DIAGNOSIS — L97929 Non-pressure chronic ulcer of unspecified part of left lower leg with unspecified severity: Secondary | ICD-10-CM | POA: Diagnosis not present

## 2019-02-14 DIAGNOSIS — L97919 Non-pressure chronic ulcer of unspecified part of right lower leg with unspecified severity: Secondary | ICD-10-CM | POA: Diagnosis not present

## 2019-02-14 DIAGNOSIS — I890 Lymphedema, not elsewhere classified: Secondary | ICD-10-CM

## 2019-02-14 NOTE — Progress Notes (Signed)
History of Present Illness  There is no documented history at this time  Assessments & Plan   There are no diagnoses linked to this encounter.    Additional instructions  Subjective:  Patient presents with venous ulcer of the Bilateral lower extremity.    Procedure:  3 layer unna wrap was placed Bilateral lower extremity.   Plan:   Follow up in one week.  

## 2019-02-20 ENCOUNTER — Telehealth: Payer: Self-pay

## 2019-02-20 NOTE — Telephone Encounter (Signed)
Copied from [place] [id number]. Topic: Appointment Scheduling - Scheduling Inquiry for Clinic >> Feb 20, 2019 10:54 AM Berneta Levins wrote: Reason for CRM:   Pt returning a call regarding his upcoming appointment tomorrow.  Tried office 3x.  Called pt and explained [email or web address] and pt understood.  appt is with Dr. Nicki Reaper tomorrow.  Jarry Manon,cma

## 2019-02-21 ENCOUNTER — Encounter: Payer: Self-pay | Admitting: Internal Medicine

## 2019-02-21 ENCOUNTER — Ambulatory Visit (INDEPENDENT_AMBULATORY_CARE_PROVIDER_SITE_OTHER): Payer: Medicare Other | Admitting: Internal Medicine

## 2019-02-21 ENCOUNTER — Ambulatory Visit (INDEPENDENT_AMBULATORY_CARE_PROVIDER_SITE_OTHER): Payer: Medicare Other | Admitting: Nurse Practitioner

## 2019-02-21 ENCOUNTER — Encounter (INDEPENDENT_AMBULATORY_CARE_PROVIDER_SITE_OTHER): Payer: Self-pay | Admitting: Nurse Practitioner

## 2019-02-21 ENCOUNTER — Other Ambulatory Visit: Payer: Self-pay

## 2019-02-21 VITALS — BP 142/66 | HR 86 | Resp 16 | Ht 69.0 in | Wt 266.0 lb

## 2019-02-21 DIAGNOSIS — Z794 Long term (current) use of insulin: Secondary | ICD-10-CM

## 2019-02-21 DIAGNOSIS — C8318 Mantle cell lymphoma, lymph nodes of multiple sites: Secondary | ICD-10-CM | POA: Diagnosis not present

## 2019-02-21 DIAGNOSIS — K219 Gastro-esophageal reflux disease without esophagitis: Secondary | ICD-10-CM | POA: Diagnosis not present

## 2019-02-21 DIAGNOSIS — N183 Chronic kidney disease, stage 3 unspecified: Secondary | ICD-10-CM

## 2019-02-21 DIAGNOSIS — I429 Cardiomyopathy, unspecified: Secondary | ICD-10-CM

## 2019-02-21 DIAGNOSIS — E7800 Pure hypercholesterolemia, unspecified: Secondary | ICD-10-CM

## 2019-02-21 DIAGNOSIS — Z79899 Other long term (current) drug therapy: Secondary | ICD-10-CM | POA: Diagnosis not present

## 2019-02-21 DIAGNOSIS — E1151 Type 2 diabetes mellitus with diabetic peripheral angiopathy without gangrene: Secondary | ICD-10-CM

## 2019-02-21 DIAGNOSIS — D649 Anemia, unspecified: Secondary | ICD-10-CM | POA: Diagnosis not present

## 2019-02-21 DIAGNOSIS — I10 Essential (primary) hypertension: Secondary | ICD-10-CM

## 2019-02-21 DIAGNOSIS — D61818 Other pancytopenia: Secondary | ICD-10-CM

## 2019-02-21 DIAGNOSIS — I890 Lymphedema, not elsewhere classified: Secondary | ICD-10-CM | POA: Diagnosis not present

## 2019-02-21 DIAGNOSIS — I5022 Chronic systolic (congestive) heart failure: Secondary | ICD-10-CM | POA: Diagnosis not present

## 2019-02-21 DIAGNOSIS — I2510 Atherosclerotic heart disease of native coronary artery without angina pectoris: Secondary | ICD-10-CM | POA: Diagnosis not present

## 2019-02-21 DIAGNOSIS — I428 Other cardiomyopathies: Secondary | ICD-10-CM | POA: Diagnosis not present

## 2019-02-21 NOTE — Progress Notes (Signed)
SUBJECTIVE:  [name], male    DOB: 11/14/1945, 73 y.o.   MRN: [id number] Chief Complaint  Patient presents with  . Follow-up    unna follow up    HPI  Gary [name] is a 73 y.o. male is today for Unna wrap check.  Today his multiple blisters and wounds appear much better.  Overall the look of the leg is much better, however he is markedly more swollen than he was previously.  He does endorse elevating his legs occasionally.  He has been confined to home since the COVID-19 pandemic, therefore his exercise has been lacking.  He denies any fevers, chills, nausea, vomiting or diarrhea.  He denies any chest pain or shortness of breath.  Past Medical History:  Diagnosis Date  . Depression   . 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 Gastroparesis   . Graves disease   . Hypercholesterolemia   . Hypertension   . Mantle cell lymphoma ([place]) 07/2014  . Peripheral neuropathy     Past Surgical History:  Procedure Laterality Date  . CHOLECYSTECTOMY    . NEPHRECTOMY     right  . PACEMAKER INSERTION N/A 12/07/2017   Procedure: INSERTION PACEMAKER DUAL CHAMBER INITIAL INSERT;  Surgeon: Isaias Cowman, MD;  Location: [facility];  Service: Cardiovascular;  Laterality: N/A;  . PORTA CATH INSERTION N/A 11/03/2017   Procedure: PORTA CATH INSERTION;  Surgeon: Katha Cabal, MD;  Location: [facility];  Service: Cardiovascular;  Laterality: N/A;    Social History   Socioeconomic History  . Marital status: Married    Spouse name: Not on file  . Number of children: 1  . Years of education: Not on file  . Highest education level: Not on file  Occupational History  . Not on file  Social Needs  . Financial resource strain: Not on file  . Food insecurity:    Worry: Not on file    Inability: Not on file  . Transportation needs:    Medical: Not on file    Non-medical: Not on file  Tobacco Use  . Smoking  status: Former Research scientist (life sciences)  . Smokeless tobacco: Current User    Types: Chew  Substance and Sexual Activity  . Alcohol use: Yes    Alcohol/week: 3.0 standard drinks    Types: 3 Cans of beer per week    Comment: nightly  . Drug use: Yes    Types: Codeine  . Sexual activity: Not Currently  Lifestyle  . Physical activity:    Days per week: Not on file    Minutes per session: Not on file  . Stress: Not on file  Relationships  . Social connections:    Talks on phone: Not on file    Gets together: Not on file    Attends religious service: Not on file    Active member of club or organization: Not on file    Attends meetings of clubs or organizations: Not on file    Relationship status: Not on file  . Intimate partner violence:    Fear of current or ex partner: Not on file    Emotionally abused: Not on file    Physically abused: Not on file    Forced sexual activity: Not on file  Other Topics Concern  . Not on file  Social History Narrative  . Not on file    Family History  Problem Relation Age of  Onset  . Breast cancer Mother   . Diabetes Father   . Cancer Father        Lymphoma  . Alcohol abuse Maternal Uncle   . Diabetes Sister   . Heart disease Other        grandfather    Allergies  Allergen Reactions  . Rofecoxib Nausea Only     Review of Systems   Review of Systems: Negative Unless Checked Constitutional: [] Weight loss  [] Fever  [] Chills Cardiac: [] Chest pain   []  Atrial Fibrillation  [] Palpitations   [] Shortness of breath when laying flat   [] Shortness of breath with exertion. [] Shortness of breath at rest Vascular:  [] Pain in legs with walking   [] Pain in legs with standing [] Pain in legs when laying flat   [] Claudication    [] Pain in feet when laying flat    [] History of DVT   [] Phlebitis   [x] Swelling in legs   [x] Varicose veins   [] Non-healing ulcers Pulmonary:   [] Uses home oxygen   [] Productive cough   [] Hemoptysis   [] Wheeze  [] COPD   [] Asthma Neurologic:   [] Dizziness   [] Seizures  [] Blackouts [] History of stroke   [] History of TIA  [] Aphasia   [] Temporary Blindness   [] Weakness or numbness in arm   [x] Weakness or numbness in leg Musculoskeletal:   [] Joint swelling   [] Joint pain   [] Low back pain  []  History of Knee Replacement [] Arthritis [] back Surgeries  []  Spinal Stenosis    Hematologic:  [] Easy bruising  [] Easy bleeding   [] Hypercoagulable state   [x] Anemic Gastrointestinal:  [] Diarrhea   [] Vomiting  [] Gastroesophageal reflux/heartburn   [] Difficulty swallowing. [] Abdominal pain Genitourinary:  [x] Chronic kidney disease   [] Difficult urination  [] Anuric   [] Blood in urine [] Frequent urination  [] Burning with urination   [] Hematuria Skin:  [] Rashes   [] Ulcers [] Wounds Psychological:  [] History of anxiety   [x]  History of major depression  []  Memory Difficulties      OBJECTIVE:   Physical Exam  BP (!) 142/66 (BP Location: Right Arm)   Pulse 86   Resp 16   Ht 5\' 9"  (1.753 m)   Wt 266 lb (120.7 kg)   BMI 39.28 kg/m   Gen: WD/WN, NAD Head: [place]/AT, No temporalis wasting.  Ear/Nose/Throat: Hearing grossly intact, nares w/o erythema or drainage Eyes: PER, EOMI, sclera nonicteric.  Neck: Supple, no masses.  No JVD.  Pulmonary:  Good air movement, no use of accessory muscles.  Cardiac: RRR Vascular:  3+ heart edema bilaterally Vessel Right Left  Radial Palpable Palpable  Dorsalis Pedis Palpable Palpable  Posterior Tibial Palpable Palpable   Gastrointestinal: soft, non-distended. No guarding/no peritoneal signs.  Musculoskeletal: M/S 5/5 throughout.  No deformity or atrophy.  Neurologic: Pain and light touch intact in extremities.  Symmetrical.  Speech is fluent. Motor exam as listed above. Psychiatric: Judgment intact, Mood & affect appropriate for pt's clinical situation. Dermatologic:  Bilateral stasis dermatitis.  No Ulcers Noted.  No changes consistent with cellulitis. Lymph : No Cervical lymphadenopathy, no lichenification or  skin changes of chronic lymphedema.  Dermal thickening present bilaterally       ASSESSMENT AND PLAN:  1. Lymphedema We will have patient return to have Unna wraps done weekly.  We will reassess his swelling in the next 4 weeks.  The patient has been urged to continue to elevate legs as much as possible.  He is also urged to walk as much as possible although it is difficult during COVID-19 restrictions.  2.  Gastroesophageal reflux disease, esophagitis presence not specified Continue PPI as already ordered, this medication has been reviewed and there are no changes at this time.  Avoidence of caffeine and alcohol  Moderate elevation of the head of the bed   3. Type 2 diabetes mellitus with diabetic peripheral angiopathy without gangrene, without long-term current use of insulin (HCC) Continue hypoglycemic medications as already ordered, these medications have been reviewed and there are no changes at this time.  Hgb A1C to be monitored as already arranged by primary service    Current Outpatient Medications on File Prior to Visit  Medication Sig Dispense Refill  . ACCU-CHEK AVIVA PLUS test strip USE AS INSTRUCTED. USE 1 STRIP EVERY 3 HOURS . ACCU-CHECK AVIVA PLUS TEST STRIP. DX E10.649  12  . cyanocobalamin (,VITAMIN B-12,) 1000 MCG/ML injection Inject into the muscle.    . ferrous sulfate 325 (65 FE) MG tablet Take 325 mg by mouth daily.    Marland Kitchen GLUCAGON EMERGENCY 1 MG injection     . glucagon, human recombinant, (GLUCAGEN DIAGNOSTIC) 1 MG injection Use as directed    . glucose 4 GM chewable tablet Chew 1 tablet by mouth once as needed for low blood sugar.    Marland Kitchen glucose blood (PRECISION QID TEST) test strip Use 8 (eight) times daily as directed. BAYER CONTOUR NEXT TEST STRIPS E10.649    . insulin aspart (NOVOLOG) 100 UNIT/ML injection Basal rate 12am 0.8 units per hour, 9am 0.9 units per hour. (total basal insulin 20.7 units). Carbohydrate ratio 1 units for every 8gm of carbohydrate.  Correction factor 1 units for every 50mg /dl over target cbg. Target CBG 80-120 1 vial 12  . levothyroxine (SYNTHROID, LEVOTHROID) 25 MCG tablet Take 25 mcg by mouth daily.    Marland Kitchen lisinopril (PRINIVIL,ZESTRIL) 20 MG tablet Take 20 mg by mouth daily.    . Multiple Vitamin (MULTIVITAMIN) tablet Take 1 tablet by mouth daily.    . pravastatin (PRAVACHOL) 40 MG tablet Take 1 tablet (40 mg total) by mouth at bedtime. Will need office visit for any more refills 30 tablet 0  . prochlorperazine (COMPAZINE) 10 MG tablet TAKE 1 TABLET (10 MG TOTAL) BY MOUTH EVERY 6 (SIX) HOURS AS NEEDED (NAUSEA OR VOMITING).    Marland Kitchen spironolactone (ALDACTONE) 25 MG tablet Take 25 mg by mouth daily.    . carvedilol (COREG) 3.125 MG tablet Take 3.125 mg by mouth 2 (two) times daily with a meal.     . furosemide (LASIX) 20 MG tablet Take 1 tablet (20 mg total) by mouth daily. 30 tablet 1   Current Facility-Administered Medications on File Prior to Visit  Medication Dose Route Frequency Provider Last Rate Last Dose  . heparin lock flush 100 unit/mL  500 Units Intravenous Once Lloyd Huger, MD      . sodium chloride flush (NS) 0.9 % injection 10 mL  10 mL Intravenous PRN Lloyd Huger, MD   10 mL at 02/01/18 0829  . sodium chloride flush (NS) 0.9 % injection 10 mL  10 mL Intravenous PRN Lloyd Huger, MD   10 mL at 04/05/18 0800    There are no Patient Instructions on file for this visit. Return in about 4 weeks (around 03/21/2019) for Unna Check .   Kris Hartmann, NP  This note was completed with Sales executive.  Any errors are purely unintentional.

## 2019-02-21 NOTE — Progress Notes (Signed)
[name], male   DOB: 1946-06-13, 73 y.o.   MRN: [id number]   Virtual Visit via video Note  This visit type was conducted due to national recommendations for restrictions regarding the COVID-19 pandemic (e.g. social distancing).  This format is felt to be most appropriate for this patient at this time.  All issues noted in this document were discussed and addressed.  No physical exam was performed (except for noted visual exam findings with Video Visits).   I connected with Gary [name] by a video enabled telemedicine application and verified that I am speaking with the correct person using two identifiers. Location patient: home Location provider: work or home office Persons participating in the virtual visit: patient, provider  I discussed the limitations, risks, security and privacy concerns of performing an evaluation and management service by video and the availability of in person appointments.   The patient expressed understanding and agreed to proceed.   Reason for visit:  Scheduled follow up.   HPI: Has been followed by [facility] for f/u lymphedema.  Unna wraps done weekly.  Last evaluated 02/21/19.  He feels the leg swelling is stable.  Also seeing cardiology.  Last evaluated 01/23/19.  Stable. Reports his breathing is better.  No chest pain.  No acid reflux.  No abdominal pain.  Bowels moving.  Seeing endocrinology (Dr Gary [name]).  a1c 6.4.  Discussed low carb diet and exercise.  Seeing Dr Gary [name].  Last evaluated 01/04/19.  Stable.  Recommended f/u labs in 3 months and f/u with Dr Gary [name] in 6 months.  He is eating.  No nausea or vomiting.  Seeing nephrology.  States his weight is stable.     ROS: See pertinent positives and negatives per HPI.  Past Medical History:  Diagnosis Date  . Depression   . 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 Gastroparesis   . Graves disease   . Hypercholesterolemia   . Hypertension    . Mantle cell lymphoma ([place]) 07/2014  . Peripheral neuropathy     Past Surgical History:  Procedure Laterality Date  . CHOLECYSTECTOMY    . NEPHRECTOMY     right  . PACEMAKER INSERTION N/A 12/07/2017   Procedure: INSERTION PACEMAKER DUAL CHAMBER INITIAL INSERT;  Surgeon: Gary Cowman, MD;  Location: [facility];  Service: Cardiovascular;  Laterality: N/A;  . PORTA CATH INSERTION N/A 11/03/2017   Procedure: PORTA CATH INSERTION;  Surgeon: Gary Cabal, MD;  Location: [place] [facility];  Service: Cardiovascular;  Laterality: N/A;    Family History  Problem Relation Age of Onset  . Breast cancer Mother   . Diabetes Father   . Cancer Father        Lymphoma  . Alcohol abuse Maternal Uncle   . Diabetes Sister   . Heart disease Other        grandfather    SOCIAL HX: reviewed.     Current Outpatient Medications:  .  ACCU-CHEK AVIVA PLUS test strip, USE AS INSTRUCTED. USE 1 STRIP EVERY 3 HOURS . ACCU-CHECK AVIVA PLUS TEST STRIP. DX E10.649, Disp: , Rfl: 12 .  carvedilol (COREG) 3.125 MG tablet, Take 3.125 mg by mouth 2 (two) times daily with a meal. , Disp: , Rfl:  .  cyanocobalamin (,VITAMIN B-12,) 1000 MCG/ML injection, Inject into the muscle., Disp: , Rfl:  .  ferrous sulfate 325 (65 FE) MG tablet, Take 325 mg by mouth daily., Disp: , Rfl:  .  furosemide (LASIX) 20 MG tablet, Take 1 tablet (20 mg total) by mouth daily., Disp: 30 tablet, Rfl: 1 .  GLUCAGON EMERGENCY 1 MG injection, , Disp: , Rfl:  .  glucagon, human recombinant, (GLUCAGEN DIAGNOSTIC) 1 MG injection, Use as directed, Disp: , Rfl:  .  glucose 4 GM chewable tablet, Chew 1 tablet by mouth once as needed for low blood sugar., Disp: , Rfl:  .  glucose blood (PRECISION QID TEST) test strip, Use 8 (eight) times daily as directed. BAYER CONTOUR NEXT TEST STRIPS E10.649, Disp: , Rfl:  .  insulin aspart (NOVOLOG) 100 UNIT/ML injection, Basal rate 12am 0.8 units per hour, 9am 0.9 units per hour. (total basal insulin  20.7 units). Carbohydrate ratio 1 units for every 8gm of carbohydrate. Correction factor 1 units for every 16m/dl over target cbg. Target CBG 80-120, Disp: 1 vial, Rfl: 12 .  levothyroxine (SYNTHROID, LEVOTHROID) 25 MCG tablet, Take 25 mcg by mouth daily., Disp: , Rfl:  .  lisinopril (PRINIVIL,ZESTRIL) 20 MG tablet, Take 20 mg by mouth daily., Disp: , Rfl:  .  Multiple Vitamin (MULTIVITAMIN) tablet, Take 1 tablet by mouth daily., Disp: , Rfl:  .  pravastatin (PRAVACHOL) 40 MG tablet, Take 1 tablet (40 mg total) by mouth at bedtime. Will need office visit for any more refills, Disp: 30 tablet, Rfl: 0 .  prochlorperazine (COMPAZINE) 10 MG tablet, TAKE 1 TABLET (10 MG TOTAL) BY MOUTH EVERY 6 (SIX) HOURS AS NEEDED (NAUSEA OR VOMITING)., Disp: , Rfl:  .  spironolactone (ALDACTONE) 25 MG tablet, Take 25 mg by mouth daily., Disp: , Rfl:  No current facility-administered medications for this visit.   Facility-Administered Medications Ordered in Other Visits:  .  heparin lock flush 100 unit/mL, 500 Units, Intravenous, Once, Gary [name], Gary November MD .  sodium chloride flush (NS) 0.9 % injection 10 mL, 10 mL, Intravenous, PRN, FLloyd Huger MD, 10 mL at 02/01/18 0829 .  sodium chloride flush (NS) 0.9 % injection 10 mL, 10 mL, Intravenous, PRN, FGrayland [name] Gary November MD, 10 mL at 04/05/18 0800  EXAM:  GENERAL: alert, oriented, appears well and in no acute distress  HEENT: atraumatic, conjunttiva clear, no obvious abnormalities on inspection of external nose and ears  NECK: normal movements of the head and neck  LUNGS: on inspection no signs of respiratory distress, breathing rate appears normal, no obvious gross SOB, gasping or wheezing  CV: no obvious cyanosis  PSYCH/NEURO: pleasant and cooperative, no obvious depression or anxiety, speech and thought processing grossly intact  ASSESSMENT AND PLAN:  Discussed the following assessment and plan:  Anemia, unspecified type  CAD in native  artery  Cardiomyopathy, idiopathic (HCC)  Chronic systolic CHF (congestive heart failure) (HCC)  CKD (chronic kidney disease) stage 3, GFR 30-59 ml/min (HCC)  Type 2 diabetes mellitus with diabetic peripheral angiopathy without gangrene, without long-term current use of insulin (HCC)  Essential hypertension, benign  Hypercholesterolemia  Lymphedema  Mantle cell lymphoma of lymph nodes of multiple regions (HCC)  Pancytopenia (HCC)  Anemia Followed by oncology.   CAD in native artery Followed by cardiology.  Continue risk factor modification.    Cardiomyopathy, idiopathic ([place] EF documented 40%.  Followed by cardiology.  Continue current medication regimen.    Chronic systolic CHF (congestive heart failure) ([place] Followed by cardiology.  Appears to be stable on current medication regimen.  Follow.    CKD (chronic kidney disease) stage 3, GFR 30-59 ml/min (HCC) Followed by nephrology.  Has planned f/u in 03/2019.  Diabetes ([place]) Followed by endocrinology.  Has a new sensor.  Last a1c 6.4.  Discussed importance of eating regular meals and not skipping meals.  Follow met b and a1c.    Essential hypertension, benign Blood pressure has been doing well.  Continue current medication regimen.  Follow pressures.  Follow metabolic panel.    Hypercholesterolemia On pravastatin.  Low cholesterol diet and exercise.  Follow lipid panel and liver function tests.    Lymphedema Followed by vascular surgery.  Weekly unna boots.    Mantle cell lymphoma ([place]) Followed by oncology.    Pancytopenia ([place]) Followed by oncology.      I discussed the assessment and treatment plan with the patient. The patient was provided an opportunity to ask questions and all were answered. The patient agreed with the plan and demonstrated an understanding of the instructions.   The patient was advised to call back or seek an in-person evaluation if the symptoms worsen or if the condition fails to  improve as anticipated.    [name], MD

## 2019-02-25 ENCOUNTER — Encounter: Payer: Self-pay | Admitting: Internal Medicine

## 2019-02-25 NOTE — Assessment & Plan Note (Signed)
Followed by oncology 

## 2019-02-25 NOTE — Assessment & Plan Note (Signed)
Followed by cardiology.  Appears to be stable on current medication regimen.  Follow.

## 2019-02-25 NOTE — Assessment & Plan Note (Signed)
Blood pressure has been doing well.  Continue current medication regimen.  Follow pressures.  Follow metabolic panel.  

## 2019-02-25 NOTE — Assessment & Plan Note (Signed)
Followed by cardiology.  Continue risk factor modification.   

## 2019-02-25 NOTE — Assessment & Plan Note (Signed)
Followed by nephrology.  Has planned f/u in 03/2019.

## 2019-02-25 NOTE — Assessment & Plan Note (Signed)
Followed by endocrinology.  Has a new sensor.  Last a1c 6.4.  Discussed importance of eating regular meals and not skipping meals.  Follow met b and a1c.

## 2019-02-25 NOTE — Assessment & Plan Note (Signed)
Followed by vascular surgery.  Weekly unna boots.

## 2019-02-25 NOTE — Assessment & Plan Note (Signed)
On pravastatin.  Low cholesterol diet and exercise.  Follow lipid panel and liver function tests.   

## 2019-02-25 NOTE — Assessment & Plan Note (Signed)
EF documented 40%.  Followed by cardiology.  Continue current medication regimen.

## 2019-02-28 ENCOUNTER — Ambulatory Visit (INDEPENDENT_AMBULATORY_CARE_PROVIDER_SITE_OTHER): Payer: Medicare Other | Admitting: Nurse Practitioner

## 2019-02-28 ENCOUNTER — Other Ambulatory Visit: Payer: Self-pay

## 2019-02-28 ENCOUNTER — Encounter (INDEPENDENT_AMBULATORY_CARE_PROVIDER_SITE_OTHER): Payer: Self-pay

## 2019-02-28 VITALS — BP 120/66 | HR 90 | Resp 16 | Ht 69.0 in | Wt 277.6 lb

## 2019-02-28 DIAGNOSIS — I890 Lymphedema, not elsewhere classified: Secondary | ICD-10-CM

## 2019-02-28 DIAGNOSIS — L97919 Non-pressure chronic ulcer of unspecified part of right lower leg with unspecified severity: Secondary | ICD-10-CM

## 2019-02-28 DIAGNOSIS — L97929 Non-pressure chronic ulcer of unspecified part of left lower leg with unspecified severity: Secondary | ICD-10-CM

## 2019-02-28 NOTE — Progress Notes (Signed)
History of Present Illness  There is no documented history at this time  Assessments & Plan   There are no diagnoses linked to this encounter.    Additional instructions  Subjective:  Patient presents with venous ulcer of the Bilateral lower extremity.    Procedure:  3 layer unna wrap was placed Bilateral lower extremity.   Plan:   Follow up in one week.  

## 2019-03-07 ENCOUNTER — Ambulatory Visit (INDEPENDENT_AMBULATORY_CARE_PROVIDER_SITE_OTHER): Payer: Medicare Other | Admitting: Nurse Practitioner

## 2019-03-07 ENCOUNTER — Encounter (INDEPENDENT_AMBULATORY_CARE_PROVIDER_SITE_OTHER): Payer: Self-pay

## 2019-03-07 ENCOUNTER — Other Ambulatory Visit: Payer: Self-pay

## 2019-03-07 VITALS — BP 145/75 | HR 94 | Resp 16 | Ht 69.0 in | Wt 272.0 lb

## 2019-03-07 DIAGNOSIS — I890 Lymphedema, not elsewhere classified: Secondary | ICD-10-CM

## 2019-03-07 DIAGNOSIS — L97929 Non-pressure chronic ulcer of unspecified part of left lower leg with unspecified severity: Secondary | ICD-10-CM

## 2019-03-07 DIAGNOSIS — L97919 Non-pressure chronic ulcer of unspecified part of right lower leg with unspecified severity: Secondary | ICD-10-CM | POA: Diagnosis not present

## 2019-03-07 NOTE — Progress Notes (Signed)
History of Present Illness  There is no documented history at this time  Assessments & Plan   There are no diagnoses linked to this encounter.    Additional instructions  Subjective:  Patient presents with venous ulcer of the Bilateral lower extremity.    Procedure:  3 layer unna wrap was placed Bilateral lower extremity.   Plan:   Follow up in one week.  

## 2019-03-14 ENCOUNTER — Encounter (INDEPENDENT_AMBULATORY_CARE_PROVIDER_SITE_OTHER): Payer: Self-pay | Admitting: Nurse Practitioner

## 2019-03-14 ENCOUNTER — Ambulatory Visit (INDEPENDENT_AMBULATORY_CARE_PROVIDER_SITE_OTHER): Payer: Medicare Other | Admitting: Nurse Practitioner

## 2019-03-14 ENCOUNTER — Other Ambulatory Visit: Payer: Self-pay

## 2019-03-14 VITALS — BP 118/66 | HR 87 | Resp 19 | Ht 70.0 in | Wt 268.6 lb

## 2019-03-14 DIAGNOSIS — I10 Essential (primary) hypertension: Secondary | ICD-10-CM | POA: Diagnosis not present

## 2019-03-14 DIAGNOSIS — I5022 Chronic systolic (congestive) heart failure: Secondary | ICD-10-CM | POA: Diagnosis not present

## 2019-03-14 DIAGNOSIS — L03116 Cellulitis of left lower limb: Secondary | ICD-10-CM | POA: Diagnosis not present

## 2019-03-14 MED ORDER — DOXYCYCLINE HYCLATE 100 MG PO CAPS: 100.0000 mg | ORAL_CAPSULE | Freq: Two times a day (BID) | ORAL | 0 refills | Status: DC

## 2019-03-19 NOTE — Progress Notes (Signed)
SUBJECTIVE:  [name], male    DOB: 1946/02/29, 73 y.o.   MRN: [id number] Chief Complaint  Patient presents with  . Follow-up    HPI  Gary [name] is a 73 y.o. male that presents today for unna wrap check.  Previously, his lower extremities were much improved but today they are red, swollen, and have multiple new ulcerations.  The patient denies removing his unna wraps or changing his daily routine.  The patient has a history of CHF and takes lasix daily.  He is weeping from his lower extremities today.  He denies any fever, chills, nausea, or vomiting.  He denies any chest pain or shortness or breath.   Past Medical History:  Diagnosis Date  . Depression   . 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 Gastroparesis   . Graves disease   . Hypercholesterolemia   . Hypertension   . Mantle cell lymphoma ([place]) 07/2014  . Peripheral neuropathy     Past Surgical History:  Procedure Laterality Date  . CHOLECYSTECTOMY    . NEPHRECTOMY     right  . PACEMAKER INSERTION N/A 12/07/2017   Procedure: INSERTION PACEMAKER DUAL CHAMBER INITIAL INSERT;  Surgeon: Isaias Cowman, MD;  Location: [facility];  Service: Cardiovascular;  Laterality: N/A;  . PORTA CATH INSERTION N/A 11/03/2017   Procedure: PORTA CATH INSERTION;  Surgeon: Katha Cabal, MD;  Location: [place] [facility];  Service: Cardiovascular;  Laterality: N/A;    Social History   Socioeconomic History  . Marital status: Married    Spouse name: Not on file  . Number of children: 1  . Years of education: Not on file  . Highest education level: Not on file  Occupational History  . Not on file  Social Needs  . Financial resource strain: Not on file  . Food insecurity:    Worry: Not on file    Inability: Not on file  . Transportation needs:    Medical: Not on file    Non-medical: Not on file  Tobacco Use  . Smoking status: Former Research scientist (life sciences)  .  Smokeless tobacco: Current User    Types: Chew  Substance and Sexual Activity  . Alcohol use: Yes    Alcohol/week: 3.0 standard drinks    Types: 3 Cans of beer per week    Comment: nightly  . Drug use: Yes    Types: Codeine  . Sexual activity: Not Currently  Lifestyle  . Physical activity:    Days per week: Not on file    Minutes per session: Not on file  . Stress: Not on file  Relationships  . Social connections:    Talks on phone: Not on file    Gets together: Not on file    Attends religious service: Not on file    Active member of club or organization: Not on file    Attends meetings of clubs or organizations: Not on file    Relationship status: Not on file  . Intimate partner violence:    Fear of current or ex partner: Not on file    Emotionally abused: Not on file    Physically abused: Not on file    Forced sexual activity: Not on file  Other Topics Concern  . Not on file  Social History Narrative  . Not on file    Family History  Problem Relation Age of Onset  . Breast cancer Mother   .  Diabetes Father   . Cancer Father        Lymphoma  . Alcohol abuse Maternal Uncle   . Diabetes Sister   . Heart disease Other        grandfather    Allergies  Allergen Reactions  . Rofecoxib Nausea Only     Review of Systems   Review of Systems: Negative Unless Checked Constitutional: [] Weight loss  [] Fever  [] Chills Cardiac: [] Chest pain   []  Atrial Fibrillation  [] Palpitations   [] Shortness of breath when laying flat   [] Shortness of breath with exertion. [] Shortness of breath at rest Vascular:  [] Pain in legs with walking   [] Pain in legs with standing [] Pain in legs when laying flat   [] Claudication    [] Pain in feet when laying flat    [] History of DVT   [] Phlebitis   [x] Swelling in legs   [] Varicose veins   [] Non-healing ulcers Pulmonary:   [] Uses home oxygen   [] Productive cough   [] Hemoptysis   [] Wheeze  [] COPD   [] Asthma Neurologic:  [] Dizziness   [] Seizures   [] Blackouts [] History of stroke   [] History of TIA  [] Aphasia   [] Temporary Blindness   [] Weakness or numbness in arm   [] Weakness or numbness in leg Musculoskeletal:   [] Joint swelling   [] Joint pain   [] Low back pain  []  History of Knee Replacement [] Arthritis [] back Surgeries  []  Spinal Stenosis    Hematologic:  [] Easy bruising  [] Easy bleeding   [] Hypercoagulable state   [] Anemic Gastrointestinal:  [] Diarrhea   [] Vomiting  [x] Gastroesophageal reflux/heartburn   [] Difficulty swallowing. [] Abdominal pain Genitourinary:  [x] Chronic kidney disease   [] Difficult urination  [] Anuric   [] Blood in urine [] Frequent urination  [] Burning with urination   [] Hematuria Skin:  [] Rashes   [x] Ulcers [] Wounds Psychological:  [] History of anxiety   []  History of major depression  []  Memory Difficulties      OBJECTIVE:   Physical Exam  BP 118/66 (BP Location: Right Arm)   Pulse 87   Resp 19   Ht 5\' 10"  (1.778 m)   Wt 268 lb 9.6 oz (121.8 kg)   BMI 38.54 kg/m   Gen: WD/WN, NAD Head: [place]/AT, No temporalis wasting.  Ear/Nose/Throat: Hearing grossly intact, nares w/o erythema or drainage Eyes: PER, EOMI, sclera nonicteric.  Neck: Supple, no masses.  No JVD.  Pulmonary:  Good air movement, no use of accessory muscles.  Cardiac: RRR Vascular: 4+ pitting edema and weeping, erythematous, multiple shallow ulcers  Vessel Right Left  Radial Palpable Palpable   Gastrointestinal: soft, non-distended. No guarding/no peritoneal signs.  Musculoskeletal: M/S 5/5 throughout.  No deformity or atrophy.  Neurologic: Pain and light touch intact in extremities.  Symmetrical.  Speech is fluent. Motor exam as listed above. Psychiatric: Judgment intact, Mood & affect appropriate for pt's clinical situation. Dermatologic: Bilateral Stasis Dermatitis  No changes consistent with cellulitis. Lymph : No Cervical lymphadenopathy, no lichenification or bilateral dermal thickening        ASSESSMENT AND PLAN:  1. Cellulitis  of leg, left We will reassess cellulitis at his next weekly unna check.  Will adjust if necessary.   - doxycycline (VIBRAMYCIN) 100 MG capsule; Take 1 capsule (100 mg total) by mouth 2 (two) times daily.  Dispense: 14 capsule; Refill: 0  2. Essential hypertension, benign Continue antihypertensive medications as already ordered, these medications have been reviewed and there are no changes at this time. Continue antihypertensive medications as already ordered, these medications have been reviewed and there  are no changes at this time.   3. Chronic systolic CHF (congestive heart failure) ([place]) May be cause of sudden change in swelling.  Advised to contact PCP to discuss possible need to adjust lasix dose.    Current Outpatient Medications on File Prior to Visit  Medication Sig Dispense Refill  . ACCU-CHEK AVIVA PLUS test strip USE AS INSTRUCTED. USE 1 STRIP EVERY 3 HOURS . ACCU-CHECK AVIVA PLUS TEST STRIP. DX E10.649  12  . carvedilol (COREG) 3.125 MG tablet Take 3.125 mg by mouth 2 (two) times daily with a meal.     . cyanocobalamin (,VITAMIN B-12,) 1000 MCG/ML injection Inject into the muscle.    . ferrous sulfate 325 (65 FE) MG tablet Take 325 mg by mouth daily.    . furosemide (LASIX) 20 MG tablet Take 1 tablet (20 mg total) by mouth daily. 30 tablet 1  . GLUCAGON EMERGENCY 1 MG injection     . glucagon, human recombinant, (GLUCAGEN DIAGNOSTIC) 1 MG injection Use as directed    . glucose 4 GM chewable tablet Chew 1 tablet by mouth once as needed for low blood sugar.    Marland Kitchen glucose blood (PRECISION QID TEST) test strip Use 8 (eight) times daily as directed. BAYER CONTOUR NEXT TEST STRIPS E10.649    . insulin aspart (NOVOLOG) 100 UNIT/ML injection Basal rate 12am 0.8 units per hour, 9am 0.9 units per hour. (total basal insulin 20.7 units). Carbohydrate ratio 1 units for every 8gm of carbohydrate. Correction factor 1 units for every 50mg /dl over target cbg. Target CBG 80-120 1 vial 12  .  levothyroxine (SYNTHROID, LEVOTHROID) 25 MCG tablet Take 25 mcg by mouth daily.    Marland Kitchen lisinopril (PRINIVIL,ZESTRIL) 20 MG tablet Take 20 mg by mouth daily.    . Multiple Vitamin (MULTIVITAMIN) tablet Take 1 tablet by mouth daily.    . pravastatin (PRAVACHOL) 40 MG tablet Take 1 tablet (40 mg total) by mouth at bedtime. Will need office visit for any more refills 30 tablet 0  . prochlorperazine (COMPAZINE) 10 MG tablet TAKE 1 TABLET (10 MG TOTAL) BY MOUTH EVERY 6 (SIX) HOURS AS NEEDED (NAUSEA OR VOMITING).    Marland Kitchen spironolactone (ALDACTONE) 25 MG tablet Take 25 mg by mouth daily.     Current Facility-Administered Medications on File Prior to Visit  Medication Dose Route Frequency Provider Last Rate Last Dose  . heparin lock flush 100 unit/mL  500 Units Intravenous Once Lloyd Huger, MD      . sodium chloride flush (NS) 0.9 % injection 10 mL  10 mL Intravenous PRN Lloyd Huger, MD   10 mL at 02/01/18 0829  . sodium chloride flush (NS) 0.9 % injection 10 mL  10 mL Intravenous PRN Lloyd Huger, MD   10 mL at 04/05/18 0800    There are no Patient Instructions on file for this visit. Return in about 1 week (around 03/21/2019) for Unna Check.   Kris Hartmann, NP  This note was completed with Sales executive.  Any errors are purely unintentional.

## 2019-03-21 ENCOUNTER — Other Ambulatory Visit: Payer: Self-pay

## 2019-03-21 ENCOUNTER — Encounter (INDEPENDENT_AMBULATORY_CARE_PROVIDER_SITE_OTHER): Payer: Self-pay

## 2019-03-21 ENCOUNTER — Ambulatory Visit (INDEPENDENT_AMBULATORY_CARE_PROVIDER_SITE_OTHER): Payer: Medicare Other | Admitting: Nurse Practitioner

## 2019-03-21 VITALS — BP 124/69 | HR 112 | Resp 18 | Wt 259.0 lb

## 2019-03-21 DIAGNOSIS — L97919 Non-pressure chronic ulcer of unspecified part of right lower leg with unspecified severity: Secondary | ICD-10-CM | POA: Diagnosis not present

## 2019-03-21 DIAGNOSIS — I890 Lymphedema, not elsewhere classified: Secondary | ICD-10-CM | POA: Diagnosis not present

## 2019-03-21 DIAGNOSIS — L97929 Non-pressure chronic ulcer of unspecified part of left lower leg with unspecified severity: Secondary | ICD-10-CM

## 2019-03-21 NOTE — Progress Notes (Signed)
History of Present Illness  There is no documented history at this time  Assessments & Plan   There are no diagnoses linked to this encounter.    Additional instructions  Subjective:  Patient presents with venous ulcer of the Bilateral lower extremity.    Procedure:  3 layer unna wrap was placed Bilateral lower extremity.   Plan:   Follow up in one week.  

## 2019-03-28 ENCOUNTER — Encounter (INDEPENDENT_AMBULATORY_CARE_PROVIDER_SITE_OTHER): Payer: Self-pay

## 2019-03-28 ENCOUNTER — Ambulatory Visit (INDEPENDENT_AMBULATORY_CARE_PROVIDER_SITE_OTHER): Payer: Medicare Other | Admitting: Nurse Practitioner

## 2019-03-28 ENCOUNTER — Other Ambulatory Visit: Payer: Self-pay

## 2019-03-28 VITALS — BP 102/62 | HR 91 | Resp 18 | Ht 69.0 in | Wt 260.0 lb

## 2019-03-28 DIAGNOSIS — L97919 Non-pressure chronic ulcer of unspecified part of right lower leg with unspecified severity: Secondary | ICD-10-CM | POA: Diagnosis not present

## 2019-03-28 DIAGNOSIS — L97929 Non-pressure chronic ulcer of unspecified part of left lower leg with unspecified severity: Secondary | ICD-10-CM

## 2019-03-28 DIAGNOSIS — I890 Lymphedema, not elsewhere classified: Secondary | ICD-10-CM | POA: Diagnosis not present

## 2019-03-28 NOTE — Progress Notes (Signed)
History of Present Illness  There is no documented history at this time  Assessments & Plan   There are no diagnoses linked to this encounter.    Additional instructions  Subjective:  Patient presents with venous ulcer of the Bilateral lower extremity.    Procedure:  3 layer unna wrap was placed Bilateral lower extremity.   Plan:   Follow up in one week.  

## 2019-04-03 ENCOUNTER — Other Ambulatory Visit: Payer: Self-pay

## 2019-04-03 ENCOUNTER — Ambulatory Visit (INDEPENDENT_AMBULATORY_CARE_PROVIDER_SITE_OTHER): Payer: Medicare Other | Admitting: Podiatry

## 2019-04-03 ENCOUNTER — Encounter: Payer: Self-pay | Admitting: Podiatry

## 2019-04-03 DIAGNOSIS — M79674 Pain in right toe(s): Secondary | ICD-10-CM

## 2019-04-03 DIAGNOSIS — M79675 Pain in left toe(s): Secondary | ICD-10-CM | POA: Diagnosis not present

## 2019-04-03 DIAGNOSIS — B351 Tinea unguium: Secondary | ICD-10-CM | POA: Insufficient documentation

## 2019-04-03 NOTE — Progress Notes (Signed)
Complaint:  Visit Type: Patient returns to my office for continued preventative foot care services. Complaint: Patient states" my nails have grown long and thick and become painful to walk and wear shoes" Patient has been diagnosed with DM with no foot complications. Patient is wearing unna boots on both legs and feet. He says these are changed weekly and is due to have them changed Tuesday.  Patient nails and toes are dirty since he is unable to bathe because of the unna boots  B/L.  The patient presents for preventative foot care services. No changes to ROS  Podiatric Exam: Vascular: deferred this visit.  Sensorium: Deferred this visit. Nail Exam: Pt has thick disfigured discolored nails with subungual debris noted bilateral entire nail hallux through fifth toenails Ulcer Exam: There is no evidence of ulcer or pre-ulcerative changes or infection. Orthopedic Exam: Muscle tone and strength are WNL. No limitations in general ROM. No crepitus or effusions noted. Foot type and digits show no abnormalities. Bony prominences are unremarkable. Skin: No Porokeratosis. No infection or ulcers  Diagnosis:  Onychomycosis, , Pain in right toe, pain in left toes  Treatment & Plan Procedures and Treatment: Consent by patient was obtained for treatment procedures.   Debridement of mycotic and hypertrophic toenails, 1 through 5 bilateral and clearing of subungual debris. No ulceration, no infection noted.  Return Visit-Office Procedure: Patient instructed to return to the office for a follow up visit 3 months for continued evaluation and treatment.    [name] DPM

## 2019-04-03 NOTE — Progress Notes (Signed)
error 

## 2019-04-04 ENCOUNTER — Ambulatory Visit (INDEPENDENT_AMBULATORY_CARE_PROVIDER_SITE_OTHER): Payer: Medicare Other | Admitting: Nurse Practitioner

## 2019-04-04 ENCOUNTER — Encounter (INDEPENDENT_AMBULATORY_CARE_PROVIDER_SITE_OTHER): Payer: Self-pay | Admitting: Nurse Practitioner

## 2019-04-04 VITALS — BP 86/48 | HR 74 | Resp 20 | Ht 70.0 in | Wt 258.0 lb

## 2019-04-04 DIAGNOSIS — Z79899 Other long term (current) drug therapy: Secondary | ICD-10-CM | POA: Diagnosis not present

## 2019-04-04 DIAGNOSIS — L03116 Cellulitis of left lower limb: Secondary | ICD-10-CM

## 2019-04-04 DIAGNOSIS — I5022 Chronic systolic (congestive) heart failure: Secondary | ICD-10-CM

## 2019-04-04 DIAGNOSIS — I890 Lymphedema, not elsewhere classified: Secondary | ICD-10-CM

## 2019-04-04 DIAGNOSIS — I10 Essential (primary) hypertension: Secondary | ICD-10-CM | POA: Diagnosis not present

## 2019-04-04 MED ORDER — CEPHALEXIN 500 MG PO CAPS: 500.0000 mg | ORAL_CAPSULE | Freq: Two times a day (BID) | ORAL | 0 refills | Status: DC

## 2019-04-06 DIAGNOSIS — I2510 Atherosclerotic heart disease of native coronary artery without angina pectoris: Secondary | ICD-10-CM | POA: Diagnosis not present

## 2019-04-06 DIAGNOSIS — I952 Hypotension due to drugs: Secondary | ICD-10-CM | POA: Diagnosis not present

## 2019-04-06 DIAGNOSIS — I5022 Chronic systolic (congestive) heart failure: Secondary | ICD-10-CM | POA: Diagnosis not present

## 2019-04-10 ENCOUNTER — Encounter (INDEPENDENT_AMBULATORY_CARE_PROVIDER_SITE_OTHER): Payer: Self-pay | Admitting: Nurse Practitioner

## 2019-04-10 NOTE — Progress Notes (Signed)
SUBJECTIVE:  [name], male    DOB: 1946/07/12, 73 y.o.   MRN: [id number] Chief Complaint  Patient presents with   Follow-up    Unna wrap check    HPI  Gary [name] is a 73 y.o. male that presents today for Unna wrap follow-up.  At last check the patient was extremely edematous with some early ulcerations.  It was suspected that the patient may be experiencing a exacerbation of his heart failure.  Since that time the patient increase his diuretic use and the swelling is greatly controlled however he is having more pain and redness, consistent with cellulitis.  Previous weeping is gone.  He denies any fever, chills, nausea, vomiting or diarrhea.  He denies any chest pain or shortness of breath.  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 with macular edema    Gastroparesis    Graves disease    Hypercholesterolemia    Hypertension    Mantle cell lymphoma ([place]) 07/2014   Peripheral neuropathy     Past Surgical History:  Procedure Laterality Date   CHOLECYSTECTOMY     NEPHRECTOMY     right   PACEMAKER INSERTION N/A 12/07/2017   Procedure: INSERTION PACEMAKER DUAL CHAMBER INITIAL INSERT;  Surgeon: Isaias Cowman, MD;  Location: [facility];  Service: Cardiovascular;  Laterality: N/A;   PORTA CATH INSERTION N/A 11/03/2017   Procedure: PORTA CATH INSERTION;  Surgeon: Katha Cabal, MD;  Location: [place] [facility];  Service: Cardiovascular;  Laterality: N/A;    Social History   Socioeconomic History   Marital status: Married    Spouse name: Not on file   Number of children: 1   Years of education: Not on file   Highest education level: Not on file  Occupational History   Not on file  Social Needs   Financial resource strain: Not on file   Food insecurity    Worry: Not on file    Inability: Not on file   Transportation needs    Medical: Not on  file    Non-medical: Not on file  Tobacco Use   Smoking status: Former Smoker   Smokeless tobacco: Current User    Types: Chew  Substance and Sexual Activity   Alcohol use: Yes    Alcohol/week: 3.0 standard drinks    Types: 3 Cans of beer per week    Comment: nightly   Drug use: Yes    Types: Codeine   Sexual activity: Not Currently  Lifestyle   Physical activity    Days per week: Not on file    Minutes per session: Not on file   Stress: Not on file  Relationships   Social connections    Talks on phone: Not on file    Gets together: Not on file    Attends religious service: Not on file    Active member of club or organization: Not on file    Attends meetings of clubs or organizations: Not on file    Relationship status: Not on file   Intimate partner violence    Fear of current or ex partner: Not on file    Emotionally abused: Not on file    Physically abused: Not on file    Forced sexual activity: Not on file  Other Topics Concern   Not on file  Social History Narrative   Not on file  Family History  Problem Relation Age of Onset   Breast cancer Mother    Diabetes Father    Cancer Father        Lymphoma   Alcohol abuse Maternal Uncle    Diabetes Sister    Heart disease Other        grandfather    Allergies  Allergen Reactions   Rofecoxib Nausea Only     Review of Systems   Review of Systems: Negative Unless Checked Constitutional: [] Weight loss  [] Fever  [] Chills Cardiac: [] Chest pain   []  Atrial Fibrillation  [] Palpitations   [] Shortness of breath when laying flat   [] Shortness of breath with exertion. [] Shortness of breath at rest Vascular:  [] Pain in legs with walking   [] Pain in legs with standing [] Pain in legs when laying flat   [] Claudication    [] Pain in feet when laying flat    [] History of DVT   [] Phlebitis   [x] Swelling in legs   [] Varicose veins   [] Non-healing ulcers Pulmonary:   [] Uses home oxygen   [] Productive cough    [] Hemoptysis   [] Wheeze  [] COPD   [] Asthma Neurologic:  [] Dizziness   [] Seizures  [] Blackouts [] History of stroke   [] History of TIA  [] Aphasia   [] Temporary Blindness   [] Weakness or numbness in arm   [] Weakness or numbness in leg Musculoskeletal:   [] Joint swelling   [] Joint pain   [] Low back pain  []  History of Knee Replacement [] Arthritis [] back Surgeries  []  Spinal Stenosis    Hematologic:  [] Easy bruising  [] Easy bleeding   [] Hypercoagulable state   [x] Anemic Gastrointestinal:  [] Diarrhea   [] Vomiting  [] Gastroesophageal reflux/heartburn   [] Difficulty swallowing. [] Abdominal pain Genitourinary:  [] Chronic kidney disease   [] Difficult urination  [] Anuric   [] Blood in urine [] Frequent urination  [] Burning with urination   [] Hematuria Skin:  [] Rashes   [x] Ulcers [] Wounds Psychological:  [] History of anxiety   []  History of major depression  []  Memory Difficulties      OBJECTIVE:   Physical Exam  BP (!) 86/48 (BP Location: Left Arm)    Pulse 74    Resp 20    Ht 5\' 10"  (1.778 m)    Wt 258 lb (117 kg)    BMI 37.02 kg/m   Gen: WD/WN, NAD Head: [place]/AT, No temporalis wasting.  Ear/Nose/Throat: Hearing grossly intact, nares w/o erythema or drainage Eyes: PER, EOMI, sclera nonicteric.  Neck: Supple, no masses.  No JVD.  Pulmonary:  Good air movement, no use of accessory muscles.  Cardiac: RRR Vascular:  Vessel Right Left  Radial Palpable Palpable   Gastrointestinal: soft, non-distended. No guarding/no peritoneal signs.  Musculoskeletal: M/S 5/5 throughout.  No deformity or atrophy.  Neurologic: Pain and light touch intact in extremities.  Symmetrical.  Speech is fluent. Motor exam as listed above. Psychiatric: Judgment intact, Mood & affect appropriate for pt's clinical situation. Dermatologic: No Venous rashes. No Ulcers Noted.    Redness and tenderness consistent with cellulitis  lymph : No Cervical lymphadenopathy, no lichenification or skin changes of chronic lymphedema.        ASSESSMENT AND PLAN:  1. Cellulitis of leg, left Patient has worsening redness and soreness of his lower extremities.  Patient was previously weeping under his dressings and there is a concern that this may have caused some cellulitis.  We will do a short course of antibiotics and check with the patient comes and have his Unna wraps done next week. - cephALEXin (KEFLEX) 500 MG capsule;  Take 1 capsule (500 mg total) by mouth 2 (two) times daily.  Dispense: 14 capsule; Refill: 0  2. Lymphedema Patient is encouraged to continue utilizing his lymphedema pump as well as elevating his lower extremities as much as possible.  The patient does have some difficulties with ambulation but he is encouraged to exercise as much as possible to help with swelling.  Patient is encouraged to weigh daily in order to ensure he does not have any further heart failure exacerbation.  3. Chronic systolic CHF (congestive heart failure) ([place]) I suspect this was the cause of his worsening swelling from last wound check.  Patient swelling is under much better control at this time.  4. Essential hypertension, benign Continue antihypertensive medications as already ordered, these medications have been reviewed and there are no changes at this time.    Current Outpatient Medications on File Prior to Visit  Medication Sig Dispense Refill   ACCU-CHEK AVIVA PLUS test strip USE AS INSTRUCTED. USE 1 STRIP EVERY 3 HOURS . ACCU-CHECK AVIVA PLUS TEST STRIP. DX E10.649  12   carvedilol (COREG) 3.125 MG tablet Take 3.125 mg by mouth 2 (two) times daily with a meal.      cyanocobalamin (,VITAMIN B-12,) 1000 MCG/ML injection Inject into the muscle.     doxycycline (VIBRAMYCIN) 100 MG capsule Take 1 capsule (100 mg total) by mouth 2 (two) times daily. 14 capsule 0   ferrous sulfate 325 (65 FE) MG tablet Take 325 mg by mouth daily.     furosemide (LASIX) 20 MG tablet Take 1 tablet (20 mg total) by mouth daily. 30 tablet 1    GLUCAGON EMERGENCY 1 MG injection      glucagon, human recombinant, (GLUCAGEN DIAGNOSTIC) 1 MG injection Use as directed     glucose 4 GM chewable tablet Chew 1 tablet by mouth once as needed for low blood sugar.     glucose blood (PRECISION QID TEST) test strip Use 8 (eight) times daily as directed. BAYER CONTOUR NEXT TEST STRIPS E10.649     insulin aspart (NOVOLOG) 100 UNIT/ML injection Basal rate 12am 0.8 units per hour, 9am 0.9 units per hour. (total basal insulin 20.7 units). Carbohydrate ratio 1 units for every 8gm of carbohydrate. Correction factor 1 units for every 50mg /dl over target cbg. Target CBG 80-120 1 vial 12   levothyroxine (SYNTHROID) 25 MCG tablet Take by mouth.     lisinopril (PRINIVIL,ZESTRIL) 20 MG tablet Take 20 mg by mouth daily.     Multiple Vitamin (MULTIVITAMIN) tablet Take 1 tablet by mouth daily.     pravastatin (PRAVACHOL) 40 MG tablet Take by mouth.     prochlorperazine (COMPAZINE) 10 MG tablet TAKE 1 TABLET (10 MG TOTAL) BY MOUTH EVERY 6 (SIX) HOURS AS NEEDED (NAUSEA OR VOMITING).     spironolactone (ALDACTONE) 25 MG tablet Take 25 mg by mouth daily.     Current Facility-Administered Medications on File Prior to Visit  Medication Dose Route Frequency Provider Last Rate Last Dose   heparin lock flush 100 unit/mL  500 Units Intravenous Once Lloyd Huger, MD       sodium chloride flush (NS) 0.9 % injection 10 mL  10 mL Intravenous PRN Lloyd Huger, MD   10 mL at 02/01/18 0829   sodium chloride flush (NS) 0.9 % injection 10 mL  10 mL Intravenous PRN Lloyd Huger, MD   10 mL at 04/05/18 0800    There are no Patient Instructions on file for this visit.  Return in about 1 week (around 04/11/2019).   Kris Hartmann, NP  This note was completed with Sales executive.  Any errors are purely unintentional.

## 2019-04-11 ENCOUNTER — Inpatient Hospital Stay: Payer: Medicare Other | Attending: Oncology | Admitting: *Deleted

## 2019-04-11 ENCOUNTER — Other Ambulatory Visit: Payer: Self-pay

## 2019-04-11 ENCOUNTER — Ambulatory Visit (INDEPENDENT_AMBULATORY_CARE_PROVIDER_SITE_OTHER): Payer: Medicare Other | Admitting: Nurse Practitioner

## 2019-04-11 VITALS — BP 107/67 | HR 99 | Resp 18 | Ht 70.0 in | Wt 259.0 lb

## 2019-04-11 DIAGNOSIS — Z9221 Personal history of antineoplastic chemotherapy: Secondary | ICD-10-CM | POA: Insufficient documentation

## 2019-04-11 DIAGNOSIS — I890 Lymphedema, not elsewhere classified: Secondary | ICD-10-CM | POA: Diagnosis not present

## 2019-04-11 DIAGNOSIS — L97919 Non-pressure chronic ulcer of unspecified part of right lower leg with unspecified severity: Secondary | ICD-10-CM | POA: Diagnosis not present

## 2019-04-11 DIAGNOSIS — C8318 Mantle cell lymphoma, lymph nodes of multiple sites: Secondary | ICD-10-CM | POA: Diagnosis not present

## 2019-04-11 DIAGNOSIS — L97929 Non-pressure chronic ulcer of unspecified part of left lower leg with unspecified severity: Secondary | ICD-10-CM

## 2019-04-11 DIAGNOSIS — Z95828 Presence of other vascular implants and grafts: Secondary | ICD-10-CM

## 2019-04-11 DIAGNOSIS — Z452 Encounter for adjustment and management of vascular access device: Secondary | ICD-10-CM | POA: Diagnosis not present

## 2019-04-11 LAB — COMPREHENSIVE METABOLIC PANEL
ALT: 25 U/L (ref 0–44)
AST: 28 U/L (ref 15–41)
Albumin: 3.2 g/dL — ABNORMAL LOW (ref 3.5–5.0)
Alkaline Phosphatase: 100 U/L (ref 38–126)
Anion gap: 7 (ref 5–15)
BUN: 50 mg/dL — ABNORMAL HIGH (ref 8–23)
CO2: 13 mmol/L — ABNORMAL LOW (ref 22–32)
Calcium: 8 mg/dL — ABNORMAL LOW (ref 8.9–10.3)
Chloride: 118 mmol/L — ABNORMAL HIGH (ref 98–111)
Creatinine, Ser: 2.06 mg/dL — ABNORMAL HIGH (ref 0.61–1.24)
GFR calc Af Amer: 37 mL/min — ABNORMAL LOW (ref >60)
GFR calc non Af Amer: 32 mL/min — ABNORMAL LOW (ref >60)
Glucose, Bld: 179 mg/dL — ABNORMAL HIGH (ref 70–99)
Potassium: 4.4 mmol/L (ref 3.5–5.1)
Sodium: 138 mmol/L (ref 135–145)
Total Bilirubin: 0.4 mg/dL (ref 0.3–1.2)
Total Protein: 6.3 g/dL — ABNORMAL LOW (ref 6.5–8.1)

## 2019-04-11 LAB — CBC WITH DIFFERENTIAL/PLATELET
Abs Immature Granulocytes: 0.05 10*3/uL (ref 0.00–0.07)
Basophils Absolute: 0.1 10*3/uL (ref 0.0–0.1)
Basophils Relative: 1 %
Eosinophils Absolute: 0.1 10*3/uL (ref 0.0–0.5)
Eosinophils Relative: 2 %
HCT: 30.2 % — ABNORMAL LOW (ref 39.0–52.0)
Hemoglobin: 10 g/dL — ABNORMAL LOW (ref 13.0–17.0)
Immature Granulocytes: 1 %
Lymphocytes Relative: 13 %
Lymphs Abs: 0.7 10*3/uL (ref 0.7–4.0)
MCH: 31.7 pg (ref 26.0–34.0)
MCHC: 33.1 g/dL (ref 30.0–36.0)
MCV: 95.9 fL (ref 80.0–100.0)
Monocytes Absolute: 0.5 10*3/uL (ref 0.1–1.0)
Monocytes Relative: 8 %
Neutro Abs: 4.1 10*3/uL (ref 1.7–7.7)
Neutrophils Relative %: 75 %
Platelets: 96 10*3/uL — ABNORMAL LOW (ref 150–400)
RBC: 3.15 MIL/uL — ABNORMAL LOW (ref 4.22–5.81)
RDW: 14.3 % (ref 11.5–15.5)
WBC: 5.5 10*3/uL (ref 4.0–10.5)
nRBC: 0 % (ref 0.0–0.2)

## 2019-04-11 MED ORDER — HEPARIN SOD (PORK) LOCK FLUSH 100 UNIT/ML IV SOLN
500.0000 [IU] | Freq: Once | INTRAVENOUS | Status: AC
  Administered 2019-04-11: 500 [IU] via INTRAVENOUS

## 2019-04-11 MED ORDER — SODIUM CHLORIDE 0.9% FLUSH
10.0000 mL | Freq: Once | INTRAVENOUS | Status: AC
  Administered 2019-04-11: 10 mL via INTRAVENOUS
  Filled 2019-04-11: qty 10

## 2019-04-11 NOTE — Progress Notes (Signed)
History of Present Illness  There is no documented history at this time  Assessments & Plan   There are no diagnoses linked to this encounter.    Additional instructions  Subjective:  Patient presents with venous ulcer of the Bilateral lower extremity.    Procedure:  3 layer unna wrap was placed Bilateral lower extremity.   Plan:   Follow up in one week.  

## 2019-04-12 DIAGNOSIS — E785 Hyperlipidemia, unspecified: Secondary | ICD-10-CM | POA: Diagnosis not present

## 2019-04-12 DIAGNOSIS — I10 Essential (primary) hypertension: Secondary | ICD-10-CM | POA: Diagnosis not present

## 2019-04-12 DIAGNOSIS — E1159 Type 2 diabetes mellitus with other circulatory complications: Secondary | ICD-10-CM | POA: Diagnosis not present

## 2019-04-12 DIAGNOSIS — E10649 Type 1 diabetes mellitus with hypoglycemia without coma: Secondary | ICD-10-CM | POA: Diagnosis not present

## 2019-04-12 DIAGNOSIS — E1069 Type 1 diabetes mellitus with other specified complication: Secondary | ICD-10-CM | POA: Diagnosis not present

## 2019-04-18 ENCOUNTER — Encounter (INDEPENDENT_AMBULATORY_CARE_PROVIDER_SITE_OTHER): Payer: Self-pay

## 2019-04-18 ENCOUNTER — Other Ambulatory Visit: Payer: Self-pay

## 2019-04-18 ENCOUNTER — Ambulatory Visit (INDEPENDENT_AMBULATORY_CARE_PROVIDER_SITE_OTHER): Payer: Medicare Other | Admitting: Nurse Practitioner

## 2019-04-18 VITALS — BP 125/77 | HR 93 | Resp 16 | Wt 256.0 lb

## 2019-04-18 DIAGNOSIS — L97929 Non-pressure chronic ulcer of unspecified part of left lower leg with unspecified severity: Secondary | ICD-10-CM | POA: Diagnosis not present

## 2019-04-18 DIAGNOSIS — L97919 Non-pressure chronic ulcer of unspecified part of right lower leg with unspecified severity: Secondary | ICD-10-CM

## 2019-04-18 DIAGNOSIS — I890 Lymphedema, not elsewhere classified: Secondary | ICD-10-CM

## 2019-04-18 NOTE — Progress Notes (Signed)
History of Present Illness  There is no documented history at this time  Assessments & Plan   There are no diagnoses linked to this encounter.    Additional instructions  Subjective:  Patient presents with venous ulcer of the Bilateral lower extremity.    Procedure:  3 layer unna wrap was placed Bilateral lower extremity.   Plan:   Follow up in one week.  

## 2019-04-21 ENCOUNTER — Encounter (INDEPENDENT_AMBULATORY_CARE_PROVIDER_SITE_OTHER): Payer: Self-pay | Admitting: Nurse Practitioner

## 2019-04-25 ENCOUNTER — Encounter (INDEPENDENT_AMBULATORY_CARE_PROVIDER_SITE_OTHER): Payer: Self-pay | Admitting: Nurse Practitioner

## 2019-04-25 ENCOUNTER — Other Ambulatory Visit: Payer: Self-pay

## 2019-04-25 ENCOUNTER — Ambulatory Visit (INDEPENDENT_AMBULATORY_CARE_PROVIDER_SITE_OTHER): Payer: Medicare Other | Admitting: Nurse Practitioner

## 2019-04-25 VITALS — BP 133/76 | HR 96 | Resp 16 | Ht 69.0 in | Wt 253.0 lb

## 2019-04-25 DIAGNOSIS — E1151 Type 2 diabetes mellitus with diabetic peripheral angiopathy without gangrene: Secondary | ICD-10-CM | POA: Diagnosis not present

## 2019-04-25 DIAGNOSIS — K219 Gastro-esophageal reflux disease without esophagitis: Secondary | ICD-10-CM | POA: Diagnosis not present

## 2019-04-25 DIAGNOSIS — I890 Lymphedema, not elsewhere classified: Secondary | ICD-10-CM | POA: Diagnosis not present

## 2019-04-25 DIAGNOSIS — Z794 Long term (current) use of insulin: Secondary | ICD-10-CM

## 2019-04-25 DIAGNOSIS — Z79899 Other long term (current) drug therapy: Secondary | ICD-10-CM

## 2019-04-26 ENCOUNTER — Telehealth (INDEPENDENT_AMBULATORY_CARE_PROVIDER_SITE_OTHER): Payer: Self-pay

## 2019-04-26 NOTE — Telephone Encounter (Signed)
Patient had left a voicemail asking for instructions on how to put on compressions socks. I gave directions how to place the socks on but if the patient is not able to place compressions sock on in a day or two he will need to be placed in unna boots per [name]

## 2019-04-29 ENCOUNTER — Encounter (INDEPENDENT_AMBULATORY_CARE_PROVIDER_SITE_OTHER): Payer: Self-pay | Admitting: Nurse Practitioner

## 2019-04-29 NOTE — Progress Notes (Signed)
SUBJECTIVE:  [name], male    DOB: 1946/08/17, 73 y.o.   MRN: [id number] Chief Complaint  Patient presents with  . Follow-up    4week unna check    HPI  Gary [name] is a 73 y.o. male that presents today for unna check.  The patient denies any fever, chills, nausea or vomiting.  The patient has no new wounds or open ulcers.  This a drastic improvement from precious visit.  There is still some swelling present but much improved.  The patient has been in [place] wraps for several months at this point.   Past Medical History:  Diagnosis Date  . Depression   . 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 Gastroparesis   . Graves disease   . Hypercholesterolemia   . Hypertension   . Mantle cell lymphoma ([place]) 07/2014  . Peripheral neuropathy     Past Surgical History:  Procedure Laterality Date  . CHOLECYSTECTOMY    . NEPHRECTOMY     right  . PACEMAKER INSERTION N/A 12/07/2017   Procedure: INSERTION PACEMAKER DUAL CHAMBER INITIAL INSERT;  Surgeon: Isaias Cowman, MD;  Location: [facility];  Service: Cardiovascular;  Laterality: N/A;  . PORTA CATH INSERTION N/A 11/03/2017   Procedure: PORTA CATH INSERTION;  Surgeon: Katha Cabal, MD;  Location: [place] [facility];  Service: Cardiovascular;  Laterality: N/A;    Social History   Socioeconomic History  . Marital status: Married    Spouse name: Not on file  . Number of children: 1  . Years of education: Not on file  . Highest education level: Not on file  Occupational History  . Not on file  Social Needs  . Financial resource strain: Not on file  . Food insecurity    Worry: Not on file    Inability: Not on file  . Transportation needs    Medical: Not on file    Non-medical: Not on file  Tobacco Use  . Smoking status: Former Research scientist (life sciences)  . Smokeless tobacco: Current User    Types: Chew  Substance and Sexual Activity  . Alcohol use: Yes   Alcohol/week: 3.0 standard drinks    Types: 3 Cans of beer per week    Comment: nightly  . Drug use: Yes    Types: Codeine  . Sexual activity: Not Currently  Lifestyle  . Physical activity    Days per week: Not on file    Minutes per session: Not on file  . Stress: Not on file  Relationships  . Social Herbalist on phone: Not on file    Gets together: Not on file    Attends religious service: Not on file    Active member of club or organization: Not on file    Attends meetings of clubs or organizations: Not on file    Relationship status: Not on file  . Intimate partner violence    Fear of current or ex partner: Not on file    Emotionally abused: Not on file    Physically abused: Not on file    Forced sexual activity: Not on file  Other Topics Concern  . Not on file  Social History Narrative  . Not on file    Family History  Problem Relation Age of Onset  . Breast cancer Mother   . Diabetes Father   . Cancer Father        Lymphoma  .  Alcohol abuse Maternal Uncle   . Diabetes Sister   . Heart disease Other        grandfather    Allergies  Allergen Reactions  . Rofecoxib Nausea Only     Review of Systems   Review of Systems: Negative Unless Checked Constitutional: [] Weight loss  [] Fever  [] Chills Cardiac: [] Chest pain   []  Atrial Fibrillation  [] Palpitations   [] Shortness of breath when laying flat   [x] Shortness of breath with exertion. [] Shortness of breath at rest Vascular:  [] Pain in legs with walking   [] Pain in legs with standing [] Pain in legs when laying flat   [] Claudication    [] Pain in feet when laying flat    [] History of DVT   [] Phlebitis   [x] Swelling in legs   [x] Varicose veins   [] Non-healing ulcers Pulmonary:   [] Uses home oxygen   [] Productive cough   [] Hemoptysis   [] Wheeze  [] COPD   [] Asthma Neurologic:  [] Dizziness   [] Seizures  [] Blackouts [] History of stroke   [] History of TIA  [] Aphasia   [] Temporary Blindness   [] Weakness or  numbness in arm   [] Weakness or numbness in leg Musculoskeletal:   [] Joint swelling   [] Joint pain   [] Low back pain  []  History of Knee Replacement [] Arthritis [] back Surgeries  []  Spinal Stenosis    Hematologic:  [] Easy bruising  [] Easy bleeding   [] Hypercoagulable state   [] Anemic Gastrointestinal:  [] Diarrhea   [] Vomiting  [] Gastroesophageal reflux/heartburn   [] Difficulty swallowing. [] Abdominal pain Genitourinary:  [x] Chronic kidney disease   [] Difficult urination  [] Anuric   [] Blood in urine [] Frequent urination  [] Burning with urination   [] Hematuria Skin:  [] Rashes   [] Ulcers [] Wounds Psychological:  [] History of anxiety   []  History of major depression  []  Memory Difficulties      OBJECTIVE:   Physical Exam  BP 133/76 (BP Location: Right Arm)   Pulse 96   Resp 16   Ht 5\' 9"  (1.753 m)   Wt 253 lb (114.8 kg)   BMI 37.36 kg/m   Gen: WD/WN, NAD Head: [place]/AT, No temporalis wasting.  Ear/Nose/Throat: Hearing grossly intact, nares w/o erythema or drainage Eyes: PER, EOMI, sclera nonicteric.  Neck: Supple, no masses.  No JVD.  Pulmonary:  Good air movement, no use of accessory muscles.  Cardiac: RRR Vascular: 2+ edema bilaterally Vessel Right Left  Radial Palpable Palpable  Dorsalis Pedis Palpable Palpable  Posterior Tibial Palpable Palpable   Gastrointestinal: soft, non-distended. No guarding/no peritoneal signs.  Musculoskeletal: M/S 5/5 throughout.  No deformity or atrophy.  Neurologic: Pain and light touch intact in extremities.  Symmetrical.  Speech is fluent. Motor exam as listed above. Psychiatric: Judgment intact, Mood & affect appropriate for pt's clinical situation. Dermatologic: bilateral stasis dermatitis. No Ulcers Noted.  No changes consistent with cellulitis. Lymph : No Cervical lymphadenopathy, bilateral lichenigication       ASSESSMENT AND PLAN:  1. Lymphedema We will take the patient out of unna wraps today.  He has compression socks that we will help  him place.  Due to the patient's history of swelling quickly so we will have him return in one week to check his status.  He Is instructed to call us sooner if he begins to swell, weep or ulcerate prior to his next visit.  2. Gastroesophageal reflux disease, esophagitis presence not specified Continue PPI as already ordered, this medication has been reviewed and there are no changes at this time.  Avoidence of caffeine and alcohol  Moderate  elevation of the head of the bed   3. Type 2 diabetes mellitus with diabetic peripheral angiopathy without gangrene, without long-term current use of insulin (HCC) Continue hypoglycemic medications as already ordered, these medications have been reviewed and there are no changes at this time.  Hgb A1C to be monitored as already arranged by primary service    Current Outpatient Medications on File Prior to Visit  Medication Sig Dispense Refill  . ACCU-CHEK AVIVA PLUS test strip USE AS INSTRUCTED. USE 1 STRIP EVERY 3 HOURS . ACCU-CHECK AVIVA PLUS TEST STRIP. DX E10.649  12  . cephALEXin (KEFLEX) 500 MG capsule Take 1 capsule (500 mg total) by mouth 2 (two) times daily. 14 capsule 0  . cyanocobalamin (,VITAMIN B-12,) 1000 MCG/ML injection Inject into the muscle.    Marland Kitchen doxycycline (VIBRAMYCIN) 100 MG capsule Take 1 capsule (100 mg total) by mouth 2 (two) times daily. 14 capsule 0  . ferrous sulfate 325 (65 FE) MG tablet Take 325 mg by mouth daily.    Marland Kitchen GLUCAGON EMERGENCY 1 MG injection     . glucagon, human recombinant, (GLUCAGEN DIAGNOSTIC) 1 MG injection Use as directed    . glucose 4 GM chewable tablet Chew 1 tablet by mouth once as needed for low blood sugar.    Marland Kitchen glucose blood (PRECISION QID TEST) test strip Use 8 (eight) times daily as directed. BAYER CONTOUR NEXT TEST STRIPS E10.649    . insulin aspart (NOVOLOG) 100 UNIT/ML injection Basal rate 12am 0.8 units per hour, 9am 0.9 units per hour. (total basal insulin 20.7 units). Carbohydrate ratio 1  units for every 8gm of carbohydrate. Correction factor 1 units for every 50mg /dl over target cbg. Target CBG 80-120 1 vial 12  . levothyroxine (SYNTHROID) 25 MCG tablet Take by mouth.    Marland Kitchen lisinopril (PRINIVIL,ZESTRIL) 20 MG tablet Take 20 mg by mouth daily.    . Multiple Vitamin (MULTIVITAMIN) tablet Take 1 tablet by mouth daily.    . pravastatin (PRAVACHOL) 40 MG tablet Take by mouth.    . prochlorperazine (COMPAZINE) 10 MG tablet TAKE 1 TABLET (10 MG TOTAL) BY MOUTH EVERY 6 (SIX) HOURS AS NEEDED (NAUSEA OR VOMITING).    Marland Kitchen spironolactone (ALDACTONE) 25 MG tablet Take 25 mg by mouth daily.    . carvedilol (COREG) 3.125 MG tablet Take 3.125 mg by mouth 2 (two) times daily with a meal.     . furosemide (LASIX) 20 MG tablet Take 1 tablet (20 mg total) by mouth daily. 30 tablet 1   Current Facility-Administered Medications on File Prior to Visit  Medication Dose Route Frequency Provider Last Rate Last Dose  . heparin lock flush 100 unit/mL  500 Units Intravenous Once Lloyd Huger, MD      . sodium chloride flush (NS) 0.9 % injection 10 mL  10 mL Intravenous PRN Lloyd Huger, MD   10 mL at 02/01/18 0829  . sodium chloride flush (NS) 0.9 % injection 10 mL  10 mL Intravenous PRN Lloyd Huger, MD   10 mL at 04/05/18 0800    There are no Patient Instructions on file for this visit. No follow-ups on file.   Kris Hartmann, NP  This note was completed with Sales executive.  Any errors are purely unintentional.

## 2019-05-04 DIAGNOSIS — I5022 Chronic systolic (congestive) heart failure: Secondary | ICD-10-CM | POA: Diagnosis not present

## 2019-05-04 DIAGNOSIS — R600 Localized edema: Secondary | ICD-10-CM | POA: Diagnosis not present

## 2019-05-04 DIAGNOSIS — I493 Ventricular premature depolarization: Secondary | ICD-10-CM | POA: Diagnosis not present

## 2019-05-04 DIAGNOSIS — I2510 Atherosclerotic heart disease of native coronary artery without angina pectoris: Secondary | ICD-10-CM | POA: Diagnosis not present

## 2019-05-08 ENCOUNTER — Ambulatory Visit (INDEPENDENT_AMBULATORY_CARE_PROVIDER_SITE_OTHER): Payer: Medicare Other | Admitting: Nurse Practitioner

## 2019-05-08 ENCOUNTER — Encounter (INDEPENDENT_AMBULATORY_CARE_PROVIDER_SITE_OTHER): Payer: Self-pay | Admitting: Nurse Practitioner

## 2019-05-08 ENCOUNTER — Other Ambulatory Visit: Payer: Self-pay

## 2019-05-08 VITALS — BP 115/63 | HR 61 | Ht 70.0 in | Wt 259.0 lb

## 2019-05-08 DIAGNOSIS — K219 Gastro-esophageal reflux disease without esophagitis: Secondary | ICD-10-CM | POA: Diagnosis not present

## 2019-05-08 DIAGNOSIS — F17220 Nicotine dependence, chewing tobacco, uncomplicated: Secondary | ICD-10-CM

## 2019-05-08 DIAGNOSIS — Z79899 Other long term (current) drug therapy: Secondary | ICD-10-CM | POA: Diagnosis not present

## 2019-05-08 DIAGNOSIS — I890 Lymphedema, not elsewhere classified: Secondary | ICD-10-CM

## 2019-05-08 DIAGNOSIS — I10 Essential (primary) hypertension: Secondary | ICD-10-CM

## 2019-05-08 NOTE — Progress Notes (Signed)
SUBJECTIVE:  [name], male    DOB: 09-May-1946, 73 y.o.   MRN: [id number] Chief Complaint  Patient presents with  . Follow-up    HPI  Gary [name] is a 73 y.o. male that presents today after a one-week holiday from [place] wraps.  The patient has been diligently wearing compression stockings on a daily basis.  However these compression stockings have been cutting into his skin and have left several open areas.  The patient also has some areas of blisters that have begun to start.  Despite these issues the patient states that his legs actually feel better due to having a break from the continuous compression.  He denies any fever, chills, nausea, vomiting or diarrhea.  He denies any chest pain or shortness of breath.  He denies any TIA-like symptoms.  Past Medical History:  Diagnosis Date  . Depression   . 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 Gastroparesis   . Graves disease   . Hypercholesterolemia   . Hypertension   . Mantle cell lymphoma ([place]) 07/2014  . Peripheral neuropathy     Past Surgical History:  Procedure Laterality Date  . CHOLECYSTECTOMY    . NEPHRECTOMY     right  . PACEMAKER INSERTION N/A 12/07/2017   Procedure: INSERTION PACEMAKER DUAL CHAMBER INITIAL INSERT;  Surgeon: Isaias Cowman, MD;  Location: [facility];  Service: Cardiovascular;  Laterality: N/A;  . PORTA CATH INSERTION N/A 11/03/2017   Procedure: PORTA CATH INSERTION;  Surgeon: Katha Cabal, MD;  Location: [place] [facility];  Service: Cardiovascular;  Laterality: N/A;    Social History   Socioeconomic History  . Marital status: Married    Spouse name: Not on file  . Number of children: 1  . Years of education: Not on file  . Highest education level: Not on file  Occupational History  . Not on file  Social Needs  . Financial resource strain: Not on file  . Food insecurity    Worry: Not on file    Inability: Not  on file  . Transportation needs    Medical: Not on file    Non-medical: Not on file  Tobacco Use  . Smoking status: Former Research scientist (life sciences)  . Smokeless tobacco: Current User    Types: Chew  Substance and Sexual Activity  . Alcohol use: Yes    Alcohol/week: 3.0 standard drinks    Types: 3 Cans of beer per week    Comment: nightly  . Drug use: Yes    Types: Codeine  . Sexual activity: Not Currently  Lifestyle  . Physical activity    Days per week: Not on file    Minutes per session: Not on file  . Stress: Not on file  Relationships  . Social Herbalist on phone: Not on file    Gets together: Not on file    Attends religious service: Not on file    Active member of club or organization: Not on file    Attends meetings of clubs or organizations: Not on file    Relationship status: Not on file  . Intimate partner violence    Fear of current or ex partner: Not on file    Emotionally abused: Not on file    Physically abused: Not on file    Forced sexual activity: Not on file  Other Topics Concern  . Not on file  Social History Narrative  .  Not on file    Family History  Problem Relation Age of Onset  . Breast cancer Mother   . Diabetes Father   . Cancer Father        Lymphoma  . Alcohol abuse Maternal Uncle   . Diabetes Sister   . Heart disease Other        grandfather    Allergies  Allergen Reactions  . Rofecoxib Nausea Only     Review of Systems   Review of Systems: Negative Unless Checked Constitutional: [] Weight loss  [] Fever  [] Chills Cardiac: [] Chest pain   []  Atrial Fibrillation  [] Palpitations   [] Shortness of breath when laying flat   [] Shortness of breath with exertion. [] Shortness of breath at rest Vascular:  [] Pain in legs with walking   [] Pain in legs with standing [] Pain in legs when laying flat   [] Claudication    [] Pain in feet when laying flat    [] History of DVT   [] Phlebitis   [x] Swelling in legs   [] Varicose veins   [] Non-healing ulcers  Pulmonary:   [] Uses home oxygen   [] Productive cough   [] Hemoptysis   [] Wheeze  [] COPD   [] Asthma Neurologic:  [] Dizziness   [] Seizures  [] Blackouts [] History of stroke   [] History of TIA  [] Aphasia   [] Temporary Blindness   [] Weakness or numbness in arm   [x] Weakness or numbness in leg Musculoskeletal:   [] Joint swelling   [] Joint pain   [] Low back pain  []  History of Knee Replacement [] Arthritis [] back Surgeries  []  Spinal Stenosis    Hematologic:  [] Easy bruising  [] Easy bleeding   [] Hypercoagulable state   [] Anemic Gastrointestinal:  [] Diarrhea   [] Vomiting  [] Gastroesophageal reflux/heartburn   [] Difficulty swallowing. [] Abdominal pain Genitourinary:  [x] Chronic kidney disease   [] Difficult urination  [] Anuric   [] Blood in urine [] Frequent urination  [] Burning with urination   [] Hematuria Skin:  [x] Rashes   [] Ulcers [] Wounds Psychological:  [] History of anxiety   [x]  History of major depression  []  Memory Difficulties      OBJECTIVE:   Physical Exam  BP 115/63 (BP Location: Right Arm)   Pulse 61   Ht 5\' 10"  (1.778 m)   Wt 259 lb (117.5 kg)   BMI 37.16 kg/m   Gen: WD/WN, NAD Head: [place]/AT, No temporalis wasting.  Ear/Nose/Throat: Hearing grossly intact, nares w/o erythema or drainage Eyes: PER, EOMI, sclera nonicteric.  Neck: Supple, no masses.  No JVD.  Pulmonary:  Good air movement, no use of accessory muscles.  Cardiac: RRR Vascular: 3 + edema bilaterally.  Wound at compression sock line. Small ulcer right lower extremity  Vessel Right Left  Radial Palpable Palpable  Dorsalis Pedis Palpable Palpable  Posterior Tibial Palpable Palpable   Gastrointestinal: soft, non-distended. No guarding/no peritoneal signs.  Musculoskeletal: M/S 5/5 throughout.  No deformity or atrophy.  Neurologic: Pain and light touch intact in extremities.  Symmetrical.  Speech is fluent. Motor exam as listed above. Psychiatric: Judgment intact, Mood & affect appropriate for pt's clinical situation.  Dermatologic: No Venous rashes. No Ulcers Noted.  No changes consistent with cellulitis. Lymph : No Cervical lymphadenopathy, no lichenification or skin changes of chronic lymphedema.       ASSESSMENT AND PLAN:  1. Lymphedema No surgery or intervention at this point in time.    I have had a long discussion with the patient regarding venous insufficiency and why it  causes symptoms, specifically venous ulceration . I have discussed with the patient the chronic skin changes that  accompany venous insufficiency and the long term sequela such as infection and recurring  ulceration.  Patient will be placed in [employer] which will be changed weekly drainage permitting.  In addition, behavioral modification including several periods of elevation of the lower extremities during the day will be continued. Achieving a position with the ankles at heart level was stressed to the patient  The patient is instructed to begin routine exercise, especially walking on a daily basis  Patient will follow-up in 4 weeks for a wrap assessment.   2. Gastroesophageal reflux disease, esophagitis presence not specified Continue PPI as already ordered, this medication has been reviewed and there are no changes at this time.  Avoidence of caffeine and alcohol  Moderate elevation of the head of the bed   3. Essential hypertension, benign Continue antihypertensive medications as already ordered, these medications have been reviewed and there are no changes at this time.    Current Outpatient Medications on File Prior to Visit  Medication Sig Dispense Refill  . ACCU-CHEK AVIVA PLUS test strip USE AS INSTRUCTED. USE 1 STRIP EVERY 3 HOURS . ACCU-CHECK AVIVA PLUS TEST STRIP. DX E10.649  12  . carvedilol (COREG) 3.125 MG tablet Take 3.125 mg by mouth 2 (two) times daily with a meal.     . cyanocobalamin (,VITAMIN B-12,) 1000 MCG/ML injection Inject into the muscle.    . ferrous sulfate 325 (65 FE) MG tablet Take 325  mg by mouth daily.    Marland Kitchen GLUCAGON EMERGENCY 1 MG injection     . glucagon, human recombinant, (GLUCAGEN DIAGNOSTIC) 1 MG injection Use as directed    . glucose 4 GM chewable tablet Chew 1 tablet by mouth once as needed for low blood sugar.    Marland Kitchen glucose blood (PRECISION QID TEST) test strip Use 8 (eight) times daily as directed. BAYER CONTOUR NEXT TEST STRIPS E10.649    . insulin aspart (NOVOLOG) 100 UNIT/ML injection Basal rate 12am 0.8 units per hour, 9am 0.9 units per hour. (total basal insulin 20.7 units). Carbohydrate ratio 1 units for every 8gm of carbohydrate. Correction factor 1 units for every 50mg /dl over target cbg. Target CBG 80-120 1 vial 12  . levothyroxine (SYNTHROID) 25 MCG tablet Take by mouth.    Marland Kitchen lisinopril (PRINIVIL,ZESTRIL) 20 MG tablet Take 20 mg by mouth daily.    . Multiple Vitamin (MULTIVITAMIN) tablet Take 1 tablet by mouth daily.    . pravastatin (PRAVACHOL) 40 MG tablet Take by mouth.    . prochlorperazine (COMPAZINE) 10 MG tablet TAKE 1 TABLET (10 MG TOTAL) BY MOUTH EVERY 6 (SIX) HOURS AS NEEDED (NAUSEA OR VOMITING).    Marland Kitchen spironolactone (ALDACTONE) 25 MG tablet Take 25 mg by mouth daily.    . cephALEXin (KEFLEX) 500 MG capsule Take 1 capsule (500 mg total) by mouth 2 (two) times daily. (Patient not taking: Reported on 05/08/2019) 14 capsule 0  . doxycycline (VIBRAMYCIN) 100 MG capsule Take 1 capsule (100 mg total) by mouth 2 (two) times daily. (Patient not taking: Reported on 05/08/2019) 14 capsule 0   Current Facility-Administered Medications on File Prior to Visit  Medication Dose Route Frequency Provider Last Rate Last Dose  . heparin lock flush 100 unit/mL  500 Units Intravenous Once Lloyd Huger, MD      . sodium chloride flush (NS) 0.9 % injection 10 mL  10 mL Intravenous PRN Lloyd Huger, MD   10 mL at 02/01/18 0829  . sodium chloride flush (NS) 0.9 % injection  10 mL  10 mL Intravenous PRN Lloyd Huger, MD   10 mL at 04/05/18 0800    There  are no Patient Instructions on file for this visit. Return in about 1 week (around 05/15/2019).   Kris Hartmann, NP  This note was completed with Sales executive.  Any errors are purely unintentional.

## 2019-05-15 ENCOUNTER — Other Ambulatory Visit: Payer: Self-pay

## 2019-05-15 ENCOUNTER — Encounter (INDEPENDENT_AMBULATORY_CARE_PROVIDER_SITE_OTHER): Payer: Self-pay

## 2019-05-15 ENCOUNTER — Ambulatory Visit (INDEPENDENT_AMBULATORY_CARE_PROVIDER_SITE_OTHER): Payer: Medicare Other | Admitting: Nurse Practitioner

## 2019-05-15 VITALS — BP 130/75 | HR 80 | Resp 14 | Ht 70.0 in | Wt 264.0 lb

## 2019-05-15 DIAGNOSIS — I890 Lymphedema, not elsewhere classified: Secondary | ICD-10-CM | POA: Diagnosis not present

## 2019-05-15 NOTE — Progress Notes (Signed)
..  History of Present Illness  There is no documented history at this time  Assessments & Plan   There are no diagnoses linked to this encounter.    Additional instructions  Subjective:  Patient presents with venous ulcer of the bilateral lower extremity.    Procedure:  3 layer unna wrap was placed Bilateral lower extremity.   Plan:   Follow up in one week.

## 2019-05-16 IMAGING — CT CT ABD-PELV W/ CM
2 of 5 series · 13 of 46 positions shown, 15 images · IV contrast (iopamidol)
Comparison: 03/17/2017 CT chest, abdomen and pelvis.

CLINICAL DATA: Low-grade mantle cell lymphoma diagnosed June 2014. Interval observation.

EXAM:
CT CHEST, ABDOMEN, AND PELVIS WITH CONTRAST
TECHNIQUE: Multidetector CT imaging of the chest, abdomen and pelvis was
performed following the standard protocol during bolus
administration of intravenous contrast.
CONTRAST:  100mL GBU1XC-OYY IOPAMIDOL (GBU1XC-OYY) INJECTION 61%

[Series 2: cap with · axial · 0.98mm/px · z∈[-660,-85]mm · 10 of 139 slices shown, 12 images]
[im 12/139  soft-tissue]
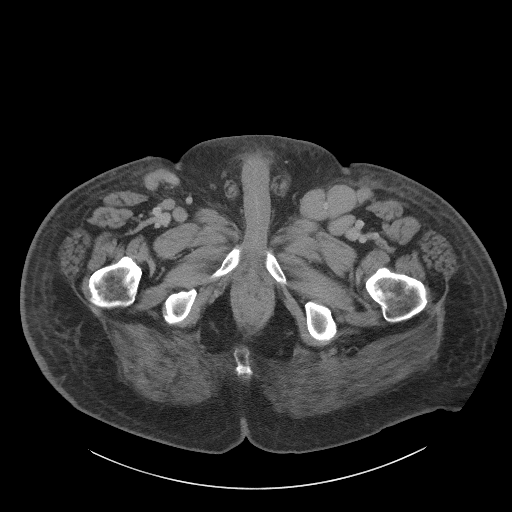
[im 12/139  bone]
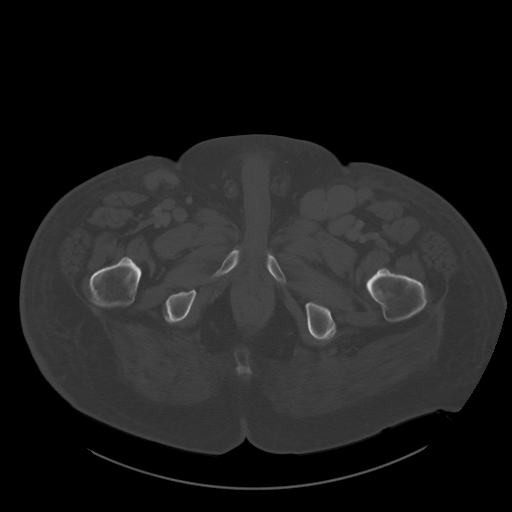
[im 24/139  soft-tissue]
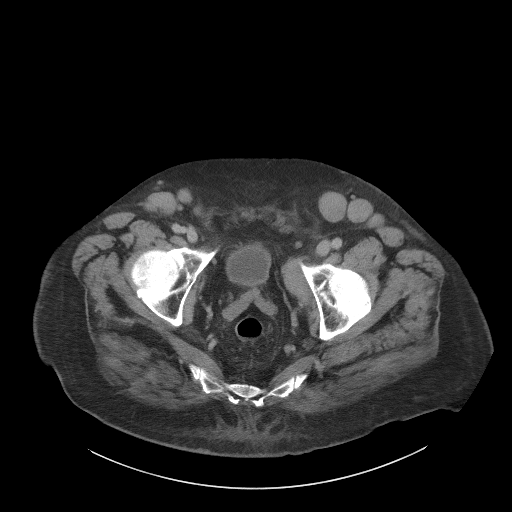
[im 35/139  soft-tissue]
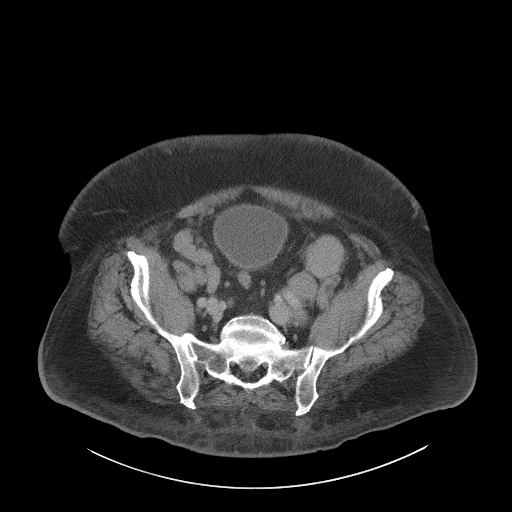
[im 47/139  soft-tissue]
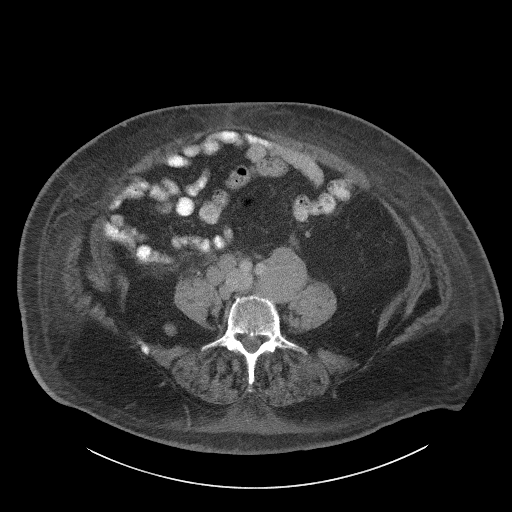
[im 58/139  soft-tissue]
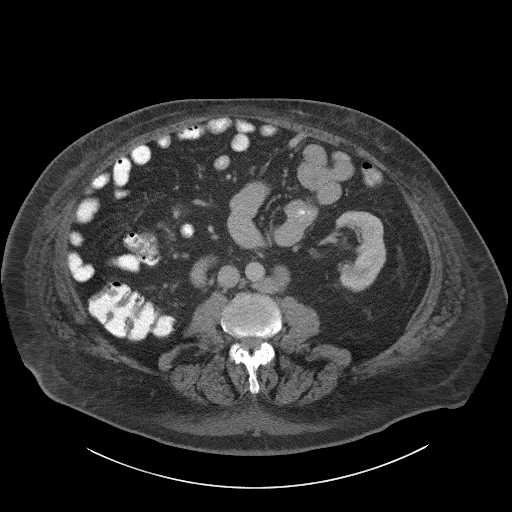
[im 81/139  soft-tissue]
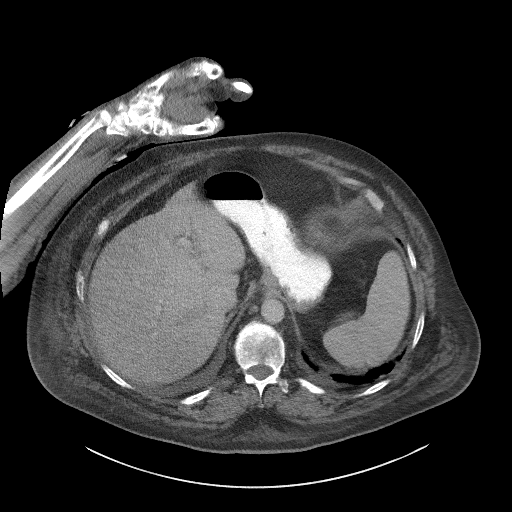
[im 93/139  soft-tissue]
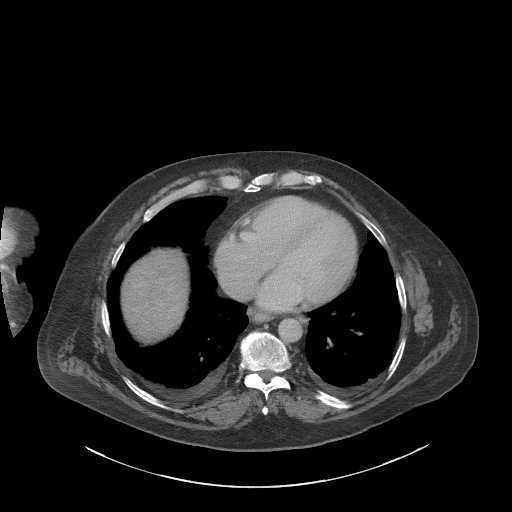
[im 104/139  soft-tissue]
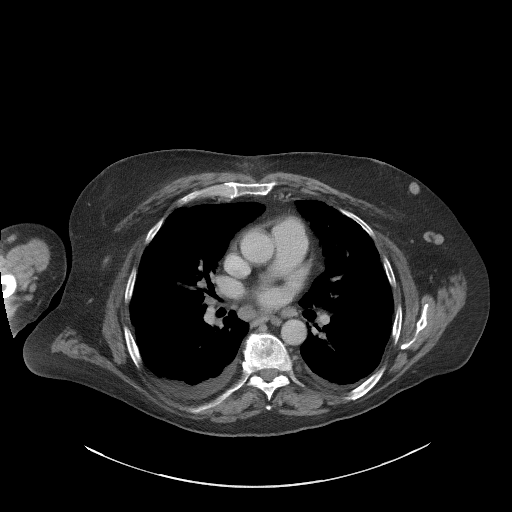
[im 116/139  soft-tissue]
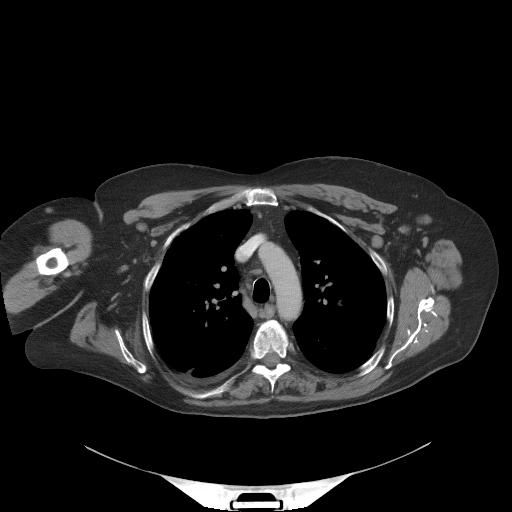
[im 116/139  bone]
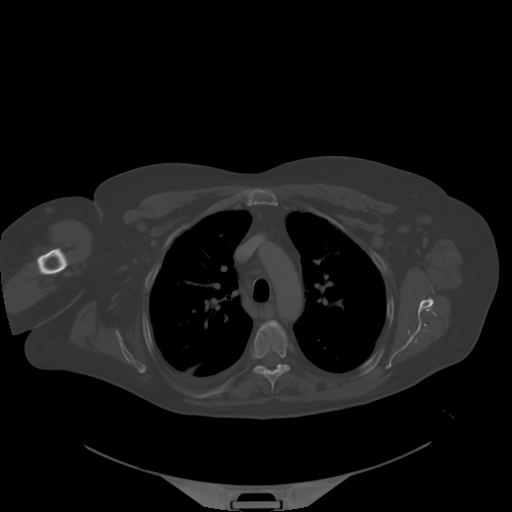
[im 127/139  soft-tissue]
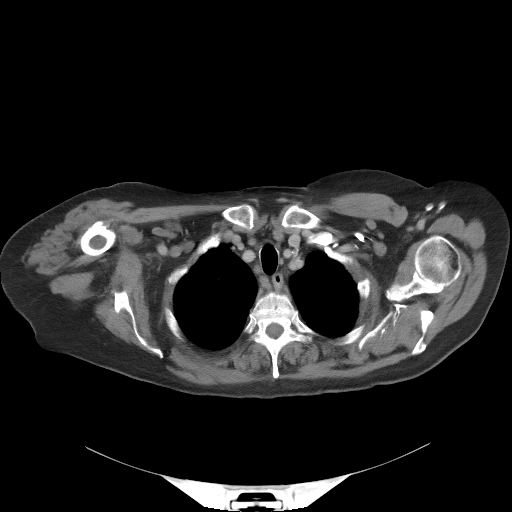

[Series 6: coronals · coronal · 0.83mm/px · 3 of 190 slices shown]
[im 64/190  soft-tissue]
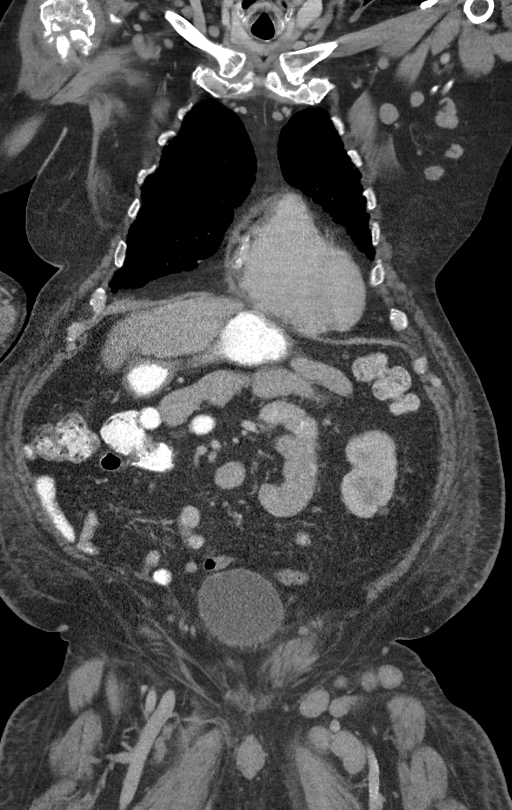
[im 85/190  soft-tissue]
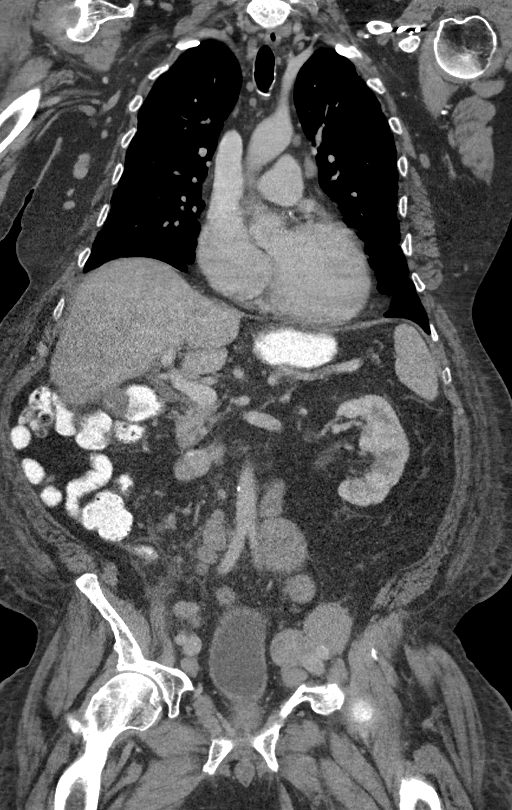
[im 106/190  soft-tissue]
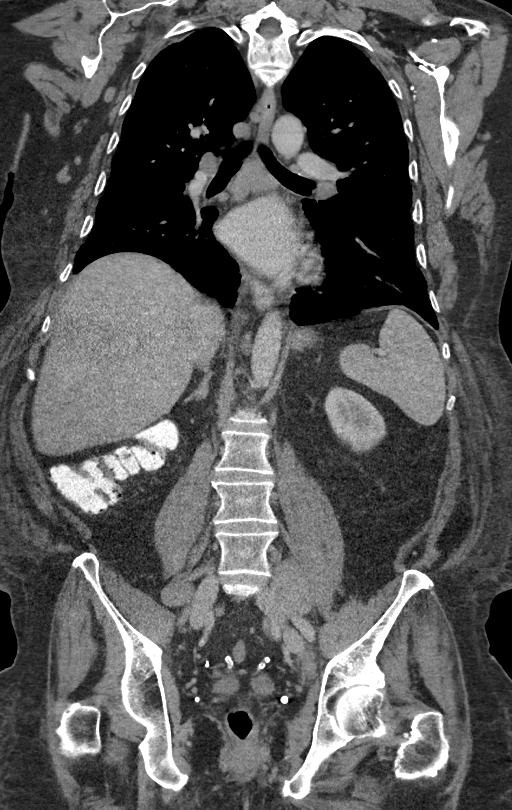

[13 of 46 positions shown; findings below may reference images not displayed]

FINDINGS: CT CHEST FINDINGS

Cardiovascular: Normal heart size. No significant pericardial
fluid/thickening. Left anterior descending and right coronary
atherosclerosis. Normal course and caliber of the thoracic aorta.
Main pulmonary artery diameter 3.1 cm, top-normal, stable. No
central pulmonary emboli.

Mediastinum/Nodes: No discrete thyroid nodules. Unremarkable
esophagus. Increased bilateral axillary/retropectoral adenopathy.
Representative enlarged 1.6 cm right axillary node (series 2/image
22), previously 1.0 cm, increased. Representative enlarged 1.3 cm
left retropectoral node (series 2/image 19), previously 0.9 cm,
increased. Newly mildly enlarged 0.8 cm left internal mammary node
(series 2/image 30). Mildly enlarged right paratracheal nodes up to
1.3 cm (series 2/image 27), previously 1.1 cm, mildly increased.
Enlarged 1.7 cm subcarinal node (series 2/image 34), previously
cm, slightly increased. Enlarged 1.9 cm right hilar node (series
2/image 30), previously 1.5 cm, increased. Newly enlarged 1.1 cm
left hilar node (series 2/image 34).

Lungs/Pleura: No pneumothorax. Small dependent bilateral pleural
effusions, right greater than left. Mild hypoventilatory changes in
the dependent lower lobes. Mild paraseptal emphysema. No acute
consolidative airspace disease or lung masses. Two solid pulmonary
nodules in the upper lobes, largest 6 mm in the medial left upper
lobe (series 4/image 52), both stable. No new significant pulmonary
nodules.

Musculoskeletal: No aggressive appearing focal osseous lesions.
Incompletely healed comminuted proximal right humerus fracture, as
originally diagnosed on 08/03/2017 right shoulder radiographs. Mild
thoracic spondylosis.

CT ABDOMEN PELVIS FINDINGS

Hepatobiliary: Normal liver size. No liver mass. Cholecystectomy. No
biliary ductal dilatation.

Pancreas: Normal, with no mass or duct dilation.

Spleen: Normal size. No mass.

Adrenals/Urinary Tract: Normal adrenals. Right nephrectomy. No mass
in the right nephrectomy bed. Simple exophytic 1.2 cm lower left
renal cyst. No additional left renal lesions. No left
hydronephrosis. Normal bladder.

Stomach/Bowel: Normal non-distended stomach. Normal caliber small
bowel with no small bowel wall thickening. Normal appendix. Oral
contrast transits to the left colon. Normal large bowel with no
diverticulosis, large bowel wall thickening or pericolonic fat
stranding.

Vascular/Lymphatic: Atherosclerotic nonaneurysmal abdominal aorta.
Patent portal, splenic and left renal veins. Left para-aortic
adenopathy measures up to 2.7 cm (series 2/image 80), previously
cm, increased. Right paracaval adenopathy measures up to 1.8 cm
(series 2/image 92), previously 1.4 cm, increased. Bulky bilateral
common iliac, external iliac and inguinal adenopathy is increased.
Representative 4.8 cm left common iliac node, previously 2.5 cm,
increased. Representative 3.9 cm left external iliac node (series
2/image 107), previously 2.9 cm, increased. Representative 2.8 cm
right inguinal node (series 2/image 134), previously 2.1 cm,
increased.

Reproductive: Top-normal size prostate.

Other: No pneumoperitoneum, ascites or focal fluid collection. Mild
anasarca is new.

Musculoskeletal: No aggressive appearing focal osseous lesions.
Lumbar spondylosis.
IMPRESSION: 1. Clear progression of lymphoma. Bilateral axillary, mediastinal,
bilateral hilar, retroperitoneal and bulky bilateral pelvic
adenopathy is all increased since 03/17/2017 CT.
2. Evidence of fluid third-spacing with small dependent bilateral
pleural effusions and mild anasarca.
3. Comminuted proximal right humerus fracture is incompletely
healed.
4. Chronic findings include: Aortic Atherosclerosis (81DXV-SFX.X)
and Emphysema (81DXV-OZT.7). Coronary atherosclerosis.

## 2019-05-22 ENCOUNTER — Ambulatory Visit (INDEPENDENT_AMBULATORY_CARE_PROVIDER_SITE_OTHER): Payer: Medicare Other | Admitting: Nurse Practitioner

## 2019-05-22 ENCOUNTER — Encounter (INDEPENDENT_AMBULATORY_CARE_PROVIDER_SITE_OTHER): Payer: Self-pay

## 2019-05-22 ENCOUNTER — Other Ambulatory Visit: Payer: Self-pay

## 2019-05-22 ENCOUNTER — Encounter (INDEPENDENT_AMBULATORY_CARE_PROVIDER_SITE_OTHER): Payer: Medicare Other

## 2019-05-22 VITALS — BP 143/63 | HR 61 | Resp 20 | Ht 69.0 in | Wt 270.0 lb

## 2019-05-22 DIAGNOSIS — I890 Lymphedema, not elsewhere classified: Secondary | ICD-10-CM

## 2019-05-22 NOTE — Progress Notes (Signed)
History of Present Illness  There is no documented history at this time  Assessments & Plan   There are no diagnoses linked to this encounter.    Additional instructions  Subjective:  Patient presents with venous ulcer of the Bilateral lower extremity.    Procedure:  3 layer unna wrap was placed Bilateral lower extremity.   Plan:   Follow up in one week.  

## 2019-05-24 DIAGNOSIS — R8290 Unspecified abnormal findings in urine: Secondary | ICD-10-CM | POA: Diagnosis not present

## 2019-05-24 DIAGNOSIS — E1021 Type 1 diabetes mellitus with diabetic nephropathy: Secondary | ICD-10-CM | POA: Diagnosis not present

## 2019-05-24 DIAGNOSIS — N183 Chronic kidney disease, stage 3 (moderate): Secondary | ICD-10-CM | POA: Diagnosis not present

## 2019-05-24 DIAGNOSIS — I129 Hypertensive chronic kidney disease with stage 1 through stage 4 chronic kidney disease, or unspecified chronic kidney disease: Secondary | ICD-10-CM | POA: Diagnosis not present

## 2019-05-24 DIAGNOSIS — I890 Lymphedema, not elsewhere classified: Secondary | ICD-10-CM | POA: Diagnosis not present

## 2019-05-29 ENCOUNTER — Ambulatory Visit (INDEPENDENT_AMBULATORY_CARE_PROVIDER_SITE_OTHER): Payer: Medicare Other | Admitting: Nurse Practitioner

## 2019-05-30 ENCOUNTER — Encounter (INDEPENDENT_AMBULATORY_CARE_PROVIDER_SITE_OTHER): Payer: Self-pay | Admitting: Nurse Practitioner

## 2019-05-30 ENCOUNTER — Other Ambulatory Visit: Payer: Self-pay

## 2019-05-30 ENCOUNTER — Ambulatory Visit (INDEPENDENT_AMBULATORY_CARE_PROVIDER_SITE_OTHER): Payer: Medicare Other | Admitting: Nurse Practitioner

## 2019-05-30 VITALS — BP 145/74 | HR 85 | Resp 16 | Ht 69.0 in | Wt 267.0 lb

## 2019-05-30 DIAGNOSIS — I890 Lymphedema, not elsewhere classified: Secondary | ICD-10-CM | POA: Diagnosis not present

## 2019-05-30 DIAGNOSIS — I10 Essential (primary) hypertension: Secondary | ICD-10-CM

## 2019-05-30 DIAGNOSIS — L03116 Cellulitis of left lower limb: Secondary | ICD-10-CM | POA: Diagnosis not present

## 2019-05-30 DIAGNOSIS — K219 Gastro-esophageal reflux disease without esophagitis: Secondary | ICD-10-CM

## 2019-05-30 DIAGNOSIS — I5022 Chronic systolic (congestive) heart failure: Secondary | ICD-10-CM | POA: Diagnosis not present

## 2019-05-30 MED ORDER — SULFAMETHOXAZOLE-TRIMETHOPRIM 400-80 MG PO TABS: 1.0000 | ORAL_TABLET | Freq: Two times a day (BID) | ORAL | 0 refills | Status: DC

## 2019-06-05 ENCOUNTER — Encounter (INDEPENDENT_AMBULATORY_CARE_PROVIDER_SITE_OTHER): Payer: Self-pay | Admitting: Nurse Practitioner

## 2019-06-05 NOTE — Progress Notes (Signed)
SUBJECTIVE:  [name], male    DOB: [birth year], 73 y.o.   MRN: [id number] Chief Complaint  Patient presents with  . Follow-up    unna check    HPI  Gary [name] is a 73 y.o. male Patient is seen for follow up evaluation of leg pain and swelling associated with venous ulceration. The patient was recently seen here and started on Unna boot therapy.  The swelling abruptly became much worse bilaterally and is associated with pain and discoloration. The pain and swelling worsens with prolonged dependency and improves with elevation.  The patient notes that in the morning the legs are better but the leg symptoms worsened throughout the course of the day. The patient has also noted a progressive worsening of the discoloration in the ankle and shin area.   The patient notes that an ulcer has developed acutely without specific trauma and since it occurred it has been very slow to heal.  There is a moderate amount of drainage associated with the open area.  The wound is also very painful.   The patient states that they have been elevating as much as possible. The patient denies any recent changes in medications.  The patient denies a history of DVT or PE. There is no prior history of phlebitis. There is no history of primary lymphedema.  No SOB or increased cough.  No sputum production.  No recent episodes of CHF exacerbation.   Past Medical History:  Diagnosis Date  . Depression   . 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 Gastroparesis   . Graves disease   . Hypercholesterolemia   . Hypertension   . Mantle cell lymphoma ([place]) 07/2014  . Peripheral neuropathy     Past Surgical History:  Procedure Laterality Date  . CHOLECYSTECTOMY    . NEPHRECTOMY     right  . PACEMAKER INSERTION N/A 12/07/2017   Procedure: INSERTION PACEMAKER DUAL CHAMBER INITIAL INSERT;  Surgeon: Isaias Cowman, MD;  Location: [facility];   Service: Cardiovascular;  Laterality: N/A;  . PORTA CATH INSERTION N/A 11/03/2017   Procedure: PORTA CATH INSERTION;  Surgeon: Katha Cabal, MD;  Location: [facility];  Service: Cardiovascular;  Laterality: N/A;    Social History   Socioeconomic History  . Marital status: Married    Spouse name: Not on file  . Number of children: 1  . Years of education: Not on file  . Highest education level: Not on file  Occupational History  . Not on file  Social Needs  . Financial resource strain: Not on file  . Food insecurity    Worry: Not on file    Inability: Not on file  . Transportation needs    Medical: Not on file    Non-medical: Not on file  Tobacco Use  . Smoking status: Former Research scientist (life sciences)  . Smokeless tobacco: Current User    Types: Chew  Substance and Sexual Activity  . Alcohol use: Yes    Alcohol/week: 3.0 standard drinks    Types: 3 Cans of beer per week    Comment: nightly  . Drug use: Yes    Types: Codeine  . Sexual activity: Not Currently  Lifestyle  . Physical activity    Days per week: Not on file    Minutes per session: Not on file  . Stress: Not on file  Relationships  . Social Herbalist on phone:  Not on file    Gets together: Not on file    Attends religious service: Not on file    Active member of club or organization: Not on file    Attends meetings of clubs or organizations: Not on file    Relationship status: Not on file  . Intimate partner violence    Fear of current or ex partner: Not on file    Emotionally abused: Not on file    Physically abused: Not on file    Forced sexual activity: Not on file  Other Topics Concern  . Not on file  Social History Narrative  . Not on file    Family History  Problem Relation Age of Onset  . Breast cancer Mother   . Diabetes Father   . Cancer Father        Lymphoma  . Alcohol abuse Maternal Uncle   . Diabetes Sister   . Heart disease Other        grandfather    Allergies   Allergen Reactions  . Rofecoxib Nausea Only     Review of Systems   Review of Systems: Negative Unless Checked Constitutional: [] Weight loss  [] Fever  [] Chills Cardiac: [] Chest pain   []  Atrial Fibrillation  [] Palpitations   [] Shortness of breath when laying flat   [] Shortness of breath with exertion. [] Shortness of breath at rest Vascular:  [] Pain in legs with walking   [] Pain in legs with standing [] Pain in legs when laying flat   [] Claudication    [] Pain in feet when laying flat    [] History of DVT   [] Phlebitis   [x] Swelling in legs   [x] Varicose veins   [] Non-healing ulcers Pulmonary:   [] Uses home oxygen   [] Productive cough   [] Hemoptysis   [] Wheeze  [] COPD   [] Asthma Neurologic:  [] Dizziness   [] Seizures  [] Blackouts [] History of stroke   [] History of TIA  [] Aphasia   [] Temporary Blindness   [] Weakness or numbness in arm   [x] Weakness or numbness in leg Musculoskeletal:   [] Joint swelling   [] Joint pain   [] Low back pain  []  History of Knee Replacement [] Arthritis [] back Surgeries  []  Spinal Stenosis    Hematologic:  [] Easy bruising  [] Easy bleeding   [] Hypercoagulable state   [] Anemic Gastrointestinal:  [] Diarrhea   [] Vomiting  [] Gastroesophageal reflux/heartburn   [] Difficulty swallowing. [] Abdominal pain Genitourinary:  [] Chronic kidney disease   [] Difficult urination  [] Anuric   [] Blood in urine [] Frequent urination  [] Burning with urination   [] Hematuria Skin:  [] Rashes   [x] Ulcers [] Wounds Psychological:  [x] History of anxiety   [x]  History of major depression  []  Memory Difficulties      OBJECTIVE:   Physical Exam  BP (!) 145/74 (BP Location: Right Arm)   Pulse 85   Resp 16   Ht 5\' 9"  (1.753 m)   Wt 267 lb (121.1 kg)   BMI 39.43 kg/m   Gen: WD/WN, NAD Head: [place]/AT, No temporalis wasting.  Ear/Nose/Throat: Hearing grossly intact, nares w/o erythema or drainage Eyes: PER, EOMI, sclera nonicteric.  Neck: Supple, no masses.  No JVD.  Pulmonary:  Good air movement, no  use of accessory muscles.  Cardiac: RRR Vascular: 3+ edema bilaterally with new blisters Vessel Right Left  Radial Palpable Palpable  Dorsalis Pedis Palpable Palpable  Posterior Tibial Palpable Palpable   Gastrointestinal: soft, non-distended. No guarding/no peritoneal signs.  Musculoskeletal: M/S 5/5 throughout.  No deformity or atrophy.  Neurologic: Pain and light touch intact in extremities.  Symmetrical.  Speech is fluent. Motor exam as listed above. Psychiatric: Judgment intact, Mood & affect appropriate for pt's clinical situation. Dermatologic: No Venous rashes. No Ulcers Noted. Changes consistent with cellulitis Lymph : No Cervical lymphadenopathy, no lichenification or skin changes of chronic lymphedema.       ASSESSMENT AND PLAN:  1. Cellulitis of leg, left Patient has had recurrent cellulitis with varying results with keflex/doxycycline. We will try Bactrim to see if this yields better results.  Also the patient was removed from his diuretics which may factor into the worsening swelling.   - sulfamethoxazole-trimethoprim (BACTRIM) 400-80 MG tablet; Take 1 tablet by mouth 2 (two) times daily.  Dispense: 20 tablet; Refill: 0  2. Lymphedema No surgery or intervention at this point in time.    I have had a long discussion with the patient regarding venous insufficiency and why it  causes symptoms, specifically venous ulceration . I have discussed with the patient the chronic skin changes that accompany venous insufficiency and the long term sequela such as infection and recurring  ulceration.  Patient will be placed in [employer] which will be changed weekly drainage permitting.  In addition, behavioral modification including several periods of elevation of the lower extremities during the day will be continued. Achieving a position with the ankles at heart level was stressed to the patient  The patient is instructed to begin routine exercise, especially walking on a daily basis   Patient will return in four weeks for una wrap evaluation   3. Gastroesophageal reflux disease, esophagitis presence not specified Continue PPI as already ordered, this medication has been reviewed and there are no changes at this time.  Avoidence of caffeine and alcohol  Moderate elevation of the head of the bed   4. Essential hypertension, benign Continue antihypertensive medications as already ordered, these medications have been reviewed and there are no changes at this time.    Current Outpatient Medications on File Prior to Visit  Medication Sig Dispense Refill  . ACCU-CHEK AVIVA PLUS test strip USE AS INSTRUCTED. USE 1 STRIP EVERY 3 HOURS . ACCU-CHECK AVIVA PLUS TEST STRIP. DX E10.649  12  . cyanocobalamin (,VITAMIN B-12,) 1000 MCG/ML injection Inject into the muscle.    . ferrous sulfate 325 (65 FE) MG tablet Take 325 mg by mouth daily.    Marland Kitchen GLUCAGON EMERGENCY 1 MG injection     . glucagon, human recombinant, (GLUCAGEN DIAGNOSTIC) 1 MG injection Use as directed    . glucose 4 GM chewable tablet Chew 1 tablet by mouth once as needed for low blood sugar.    Marland Kitchen glucose blood (PRECISION QID TEST) test strip Use 8 (eight) times daily as directed. BAYER CONTOUR NEXT TEST STRIPS E10.649    . insulin aspart (NOVOLOG) 100 UNIT/ML injection Basal rate 12am 0.8 units per hour, 9am 0.9 units per hour. (total basal insulin 20.7 units). Carbohydrate ratio 1 units for every 8gm of carbohydrate. Correction factor 1 units for every 50mg /dl over target cbg. Target CBG 80-120 1 vial 12  . levothyroxine (SYNTHROID) 25 MCG tablet Take by mouth.    Marland Kitchen lisinopril (PRINIVIL,ZESTRIL) 20 MG tablet Take 20 mg by mouth daily.    . Multiple Vitamin (MULTIVITAMIN) tablet Take 1 tablet by mouth daily.    . pravastatin (PRAVACHOL) 40 MG tablet Take by mouth.    . prochlorperazine (COMPAZINE) 10 MG tablet TAKE 1 TABLET (10 MG TOTAL) BY MOUTH EVERY 6 (SIX) HOURS AS NEEDED (NAUSEA OR VOMITING).    Marland Kitchen  spironolactone (ALDACTONE) 25 MG tablet Take 25 mg by mouth daily.    . carvedilol (COREG) 3.125 MG tablet Take 3.125 mg by mouth 2 (two) times daily with a meal.     . cephALEXin (KEFLEX) 500 MG capsule Take 1 capsule (500 mg total) by mouth 2 (two) times daily. (Patient not taking: Reported on 05/08/2019) 14 capsule 0  . doxycycline (VIBRAMYCIN) 100 MG capsule Take 1 capsule (100 mg total) by mouth 2 (two) times daily. (Patient not taking: Reported on 05/08/2019) 14 capsule 0   Current Facility-Administered Medications on File Prior to Visit  Medication Dose Route Frequency Provider Last Rate Last Dose  . heparin lock flush 100 unit/mL  500 Units Intravenous Once Lloyd Huger, MD      . sodium chloride flush (NS) 0.9 % injection 10 mL  10 mL Intravenous PRN Lloyd Huger, MD   10 mL at 02/01/18 0829  . sodium chloride flush (NS) 0.9 % injection 10 mL  10 mL Intravenous PRN Lloyd Huger, MD   10 mL at 04/05/18 0800    There are no Patient Instructions on file for this visit. No follow-ups on file.   Kris Hartmann, NP  This note was completed with Sales executive.  Any errors are purely unintentional.

## 2019-06-06 ENCOUNTER — Other Ambulatory Visit: Payer: Self-pay

## 2019-06-06 ENCOUNTER — Encounter (INDEPENDENT_AMBULATORY_CARE_PROVIDER_SITE_OTHER): Payer: Self-pay

## 2019-06-06 ENCOUNTER — Ambulatory Visit (INDEPENDENT_AMBULATORY_CARE_PROVIDER_SITE_OTHER): Payer: Medicare Other | Admitting: Nurse Practitioner

## 2019-06-06 VITALS — BP 128/73 | HR 98 | Resp 18 | Ht 69.0 in | Wt 274.0 lb

## 2019-06-06 DIAGNOSIS — I890 Lymphedema, not elsewhere classified: Secondary | ICD-10-CM | POA: Diagnosis not present

## 2019-06-06 NOTE — Progress Notes (Signed)
History of Present Illness  There is no documented history at this time  Assessments & Plan   There are no diagnoses linked to this encounter.    Additional instructions  Subjective:  Patient presents with venous ulcer of the Bilateral lower extremity.    Procedure:  3 layer unna wrap was placed Bilateral lower extremity.   Plan:   Follow up in one week.  

## 2019-06-08 ENCOUNTER — Inpatient Hospital Stay: Payer: Medicare Other | Attending: Oncology

## 2019-06-08 ENCOUNTER — Other Ambulatory Visit: Payer: Self-pay

## 2019-06-08 DIAGNOSIS — Z452 Encounter for adjustment and management of vascular access device: Secondary | ICD-10-CM | POA: Insufficient documentation

## 2019-06-08 DIAGNOSIS — Z95828 Presence of other vascular implants and grafts: Secondary | ICD-10-CM

## 2019-06-08 DIAGNOSIS — Z9221 Personal history of antineoplastic chemotherapy: Secondary | ICD-10-CM | POA: Insufficient documentation

## 2019-06-08 DIAGNOSIS — C8318 Mantle cell lymphoma, lymph nodes of multiple sites: Secondary | ICD-10-CM | POA: Insufficient documentation

## 2019-06-08 MED ORDER — SODIUM CHLORIDE 0.9% FLUSH
10.0000 mL | Freq: Once | INTRAVENOUS | Status: AC
  Administered 2019-06-08: 11:00:00 10 mL via INTRAVENOUS
  Filled 2019-06-08: qty 10

## 2019-06-08 MED ORDER — HEPARIN SOD (PORK) LOCK FLUSH 100 UNIT/ML IV SOLN
500.0000 [IU] | Freq: Once | INTRAVENOUS | Status: AC
  Administered 2019-06-08: 500 [IU] via INTRAVENOUS

## 2019-06-13 ENCOUNTER — Encounter (INDEPENDENT_AMBULATORY_CARE_PROVIDER_SITE_OTHER): Payer: Self-pay | Admitting: Nurse Practitioner

## 2019-06-13 ENCOUNTER — Other Ambulatory Visit: Payer: Self-pay

## 2019-06-13 ENCOUNTER — Ambulatory Visit (INDEPENDENT_AMBULATORY_CARE_PROVIDER_SITE_OTHER): Payer: Medicare Other | Admitting: Nurse Practitioner

## 2019-06-13 VITALS — BP 145/81 | HR 92 | Resp 12 | Ht 70.0 in | Wt 267.0 lb

## 2019-06-13 DIAGNOSIS — I890 Lymphedema, not elsewhere classified: Secondary | ICD-10-CM

## 2019-06-13 NOTE — Progress Notes (Signed)
..  History of Present Illness  There is no documented history at this time  Assessments & Plan   There are no diagnoses linked to this encounter.    Additional instructions  Subjective:  Patient presents with venous ulcer of the Bilateral lower extremity.    Procedure:  3 layer unna wrap was placed Bilateral lower extremity.   Plan:   Follow up in one week.   Per patient request put Neosporin on legs before applying unna wrap.

## 2019-06-20 ENCOUNTER — Ambulatory Visit (INDEPENDENT_AMBULATORY_CARE_PROVIDER_SITE_OTHER): Payer: Medicare Other | Admitting: Nurse Practitioner

## 2019-06-20 ENCOUNTER — Encounter (INDEPENDENT_AMBULATORY_CARE_PROVIDER_SITE_OTHER): Payer: Self-pay | Admitting: Nurse Practitioner

## 2019-06-20 ENCOUNTER — Other Ambulatory Visit: Payer: Self-pay

## 2019-06-20 VITALS — BP 139/70 | HR 90 | Resp 18 | Ht 70.0 in | Wt 265.0 lb

## 2019-06-20 DIAGNOSIS — I890 Lymphedema, not elsewhere classified: Secondary | ICD-10-CM

## 2019-06-20 DIAGNOSIS — K219 Gastro-esophageal reflux disease without esophagitis: Secondary | ICD-10-CM

## 2019-06-20 DIAGNOSIS — L03116 Cellulitis of left lower limb: Secondary | ICD-10-CM

## 2019-06-20 MED ORDER — DOXYCYCLINE HYCLATE 100 MG PO CAPS: 100.0000 mg | ORAL_CAPSULE | Freq: Two times a day (BID) | ORAL | 0 refills | Status: DC

## 2019-06-20 NOTE — Progress Notes (Signed)
SUBJECTIVE:  [name], male    DOB: 1945-12-09, 73 y.o.   MRN: [id number] Chief Complaint  Patient presents with  . Follow-up    unna boot check    HPI  Gary [name] is a 73 y.o. male presents today for evaluation of his bilateral lower extremity ulcerations with swelling after weeks of Unna wraps.  The patient continues to have some shallow ulcerations bilaterally.  The swelling is somewhat moderately controlled however it appears that there may be a recurrence of cellulitis as well.  The patient has not been on diuretics for several weeks.  He denies any chest pain or shortness of breath.  He denies removal of his wraps.  The patient continues to elevate as much as possible.  He denies any fever, chills, nausea, vomiting or diarrhea.  Past Medical History:  Diagnosis Date  . Depression   . 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 Gastroparesis   . Graves disease   . Hypercholesterolemia   . Hypertension   . Mantle cell lymphoma ([place]) 07/2014  . Peripheral neuropathy     Past Surgical History:  Procedure Laterality Date  . CHOLECYSTECTOMY    . NEPHRECTOMY     right  . PACEMAKER INSERTION N/A 12/07/2017   Procedure: INSERTION PACEMAKER DUAL CHAMBER INITIAL INSERT;  Surgeon: Isaias Cowman, MD;  Location: [facility];  Service: Cardiovascular;  Laterality: N/A;  . PORTA CATH INSERTION N/A 11/03/2017   Procedure: PORTA CATH INSERTION;  Surgeon: Katha Cabal, MD;  Location: [facility];  Service: Cardiovascular;  Laterality: N/A;    Social History   Socioeconomic History  . Marital status: Married    Spouse name: Not on file  . Number of children: 1  . Years of education: Not on file  . Highest education level: Not on file  Occupational History  . Not on file  Social Needs  . Financial resource strain: Not on file  . Food insecurity    Worry: Not on file    Inability: Not on file  .  Transportation needs    Medical: Not on file    Non-medical: Not on file  Tobacco Use  . Smoking status: Former Research scientist (life sciences)  . Smokeless tobacco: Current User    Types: Chew  Substance and Sexual Activity  . Alcohol use: Yes    Alcohol/week: 3.0 standard drinks    Types: 3 Cans of beer per week    Comment: nightly  . Drug use: Yes    Types: Codeine  . Sexual activity: Not Currently  Lifestyle  . Physical activity    Days per week: Not on file    Minutes per session: Not on file  . Stress: Not on file  Relationships  . Social Herbalist on phone: Not on file    Gets together: Not on file    Attends religious service: Not on file    Active member of club or organization: Not on file    Attends meetings of clubs or organizations: Not on file    Relationship status: Not on file  . Intimate partner violence    Fear of current or ex partner: Not on file    Emotionally abused: Not on file    Physically abused: Not on file    Forced sexual activity: Not on file  Other Topics Concern  . Not on file  Social History Narrative  .  Not on file    Family History  Problem Relation Age of Onset  . Breast cancer Mother   . Diabetes Father   . Cancer Father        Lymphoma  . Alcohol abuse Maternal Uncle   . Diabetes Sister   . Heart disease Other        grandfather    Allergies  Allergen Reactions  . Rofecoxib Nausea Only     Review of Systems   Review of Systems: Negative Unless Checked Constitutional: [] Weight loss  [] Fever  [] Chills Cardiac: [] Chest pain   []  Atrial Fibrillation  [] Palpitations   [] Shortness of breath when laying flat   [] Shortness of breath with exertion. [] Shortness of breath at rest Vascular:  [] Pain in legs with walking   [] Pain in legs with standing [] Pain in legs when laying flat   [] Claudication    [] Pain in feet when laying flat    [] History of DVT   [] Phlebitis   [x] Swelling in legs   [] Varicose veins   [x] Non-healing ulcers Pulmonary:    [] Uses home oxygen   [] Productive cough   [] Hemoptysis   [] Wheeze  [] COPD   [] Asthma Neurologic:  [] Dizziness   [] Seizures  [] Blackouts [] History of stroke   [] History of TIA  [] Aphasia   [] Temporary Blindness   [] Weakness or numbness in arm   [] Weakness or numbness in leg Musculoskeletal:   [] Joint swelling   [] Joint pain   [] Low back pain  []  History of Knee Replacement [] Arthritis [] back Surgeries  []  Spinal Stenosis    Hematologic:  [] Easy bruising  [] Easy bleeding   [] Hypercoagulable state   [] Anemic Gastrointestinal:  [] Diarrhea   [] Vomiting  [] Gastroesophageal reflux/heartburn   [] Difficulty swallowing. [] Abdominal pain Genitourinary:  [x] Chronic kidney disease   [] Difficult urination  [] Anuric   [] Blood in urine [] Frequent urination  [] Burning with urination   [] Hematuria Skin:  [] Rashes   [x] Ulcers [] Wounds Psychological:  [] History of anxiety   []  History of major depression  []  Memory Difficulties      OBJECTIVE:   Physical Exam  BP 139/70 (BP Location: Right Arm)   Pulse 90   Resp 18   Ht 5\' 10"  (1.778 m)   Wt 265 lb (120.2 kg)   BMI 38.02 kg/m   Gen: WD/WN, NAD Head: [place]/AT, No temporalis wasting.  Ear/Nose/Throat: Hearing grossly intact, nares w/o erythema or drainage Eyes: PER, EOMI, sclera nonicteric.  Neck: Supple, no masses.  No JVD.  Pulmonary:  Good air movement, no use of accessory muscles.  Cardiac: RRR Vascular:  3+ edema bilaterally with little progress in the healing of his previous ulcers. Vessel Right Left  Radial Palpable Palpable  Dorsalis Pedis Palpable Palpable  Posterior Tibial Palpable Palpable   Gastrointestinal: soft, non-distended. No guarding/no peritoneal signs.  Musculoskeletal: M/S 5/5 throughout.  No deformity or atrophy.  Neurologic: Pain and light touch intact in extremities.  Symmetrical.  Speech is fluent. Motor exam as listed above. Psychiatric: Judgment intact, Mood & affect appropriate for pt's clinical situation. Dermatologic: No  Venous rashes. No Ulcers Noted.  Changes consistent with cellulitis bilaterally Lymph : No Cervical lymphadenopathy, no lichenification or skin changes of chronic lymphedema.       ASSESSMENT AND PLAN:  1. Cellulitis of leg, left The patient continues to have a cycle of recurrent cellulitis.  There is concern that the patient's swelling may be due more so to his fluid status versus lymphedema.  If so the patient may need some diuretics and we  may reach out to his primary care provider if you continue to struggle with swelling and ulcerations.  We will also try to do a longer course of doxycycline as well to see if this helps.  Otherwise we may also consider placing the patient on a prophylactic antibiotic dose for cellulitis. - doxycycline (VIBRAMYCIN) 100 MG capsule; Take 1 capsule (100 mg total) by mouth 2 (two) times daily.  Dispense: 28 capsule; Refill: 0  2. Lymphedema We will continue to have the patient placed in bilateral Unna wraps.  The patient will also continue with other conservative methods such as elevation of his lower extremities, exercise and NSAIDs for pain if necessary.  The patient will continue to have this wrap change in office as well as we will follow-up in 4 weeks.  3. Gastroesophageal reflux disease, esophagitis presence not specified Continue PPI as already ordered, this medication has been reviewed and there are no changes at this time.  Avoidence of caffeine and alcohol  Moderate elevation of the head of the bed    Current Outpatient Medications on File Prior to Visit  Medication Sig Dispense Refill  . ACCU-CHEK AVIVA PLUS test strip USE AS INSTRUCTED. USE 1 STRIP EVERY 3 HOURS . ACCU-CHECK AVIVA PLUS TEST STRIP. DX E10.649  12  . cyanocobalamin (,VITAMIN B-12,) 1000 MCG/ML injection Inject into the muscle.    . ferrous sulfate 325 (65 FE) MG tablet Take 325 mg by mouth daily.    Marland Kitchen GLUCAGON EMERGENCY 1 MG injection     . glucagon, human recombinant, (GLUCAGEN  DIAGNOSTIC) 1 MG injection Use as directed    . glucose 4 GM chewable tablet Chew 1 tablet by mouth once as needed for low blood sugar.    Marland Kitchen glucose blood (PRECISION QID TEST) test strip Use 8 (eight) times daily as directed. BAYER CONTOUR NEXT TEST STRIPS E10.649    . insulin aspart (NOVOLOG) 100 UNIT/ML injection Basal rate 12am 0.8 units per hour, 9am 0.9 units per hour. (total basal insulin 20.7 units). Carbohydrate ratio 1 units for every 8gm of carbohydrate. Correction factor 1 units for every 50mg /dl over target cbg. Target CBG 80-120 1 vial 12  . levothyroxine (SYNTHROID) 25 MCG tablet Take by mouth.    . Multiple Vitamin (MULTIVITAMIN) tablet Take 1 tablet by mouth daily.    . pravastatin (PRAVACHOL) 40 MG tablet Take by mouth.    . prochlorperazine (COMPAZINE) 10 MG tablet TAKE 1 TABLET (10 MG TOTAL) BY MOUTH EVERY 6 (SIX) HOURS AS NEEDED (NAUSEA OR VOMITING).    Marland Kitchen spironolactone (ALDACTONE) 25 MG tablet Take 25 mg by mouth daily.    . carvedilol (COREG) 3.125 MG tablet Take 3.125 mg by mouth 2 (two) times daily with a meal.     . cephALEXin (KEFLEX) 500 MG capsule Take 1 capsule (500 mg total) by mouth 2 (two) times daily. (Patient not taking: Reported on 05/08/2019) 14 capsule 0  . lisinopril (PRINIVIL,ZESTRIL) 20 MG tablet Take 20 mg by mouth daily.    Marland Kitchen sulfamethoxazole-trimethoprim (BACTRIM) 400-80 MG tablet Take 1 tablet by mouth 2 (two) times daily. (Patient not taking: Reported on 06/20/2019) 20 tablet 0   Current Facility-Administered Medications on File Prior to Visit  Medication Dose Route Frequency Provider Last Rate Last Dose  . heparin lock flush 100 unit/mL  500 Units Intravenous Once Lloyd Huger, MD      . sodium chloride flush (NS) 0.9 % injection 10 mL  10 mL Intravenous PRN Lloyd Huger,  MD   10 mL at 02/01/18 0829  . sodium chloride flush (NS) 0.9 % injection 10 mL  10 mL Intravenous PRN Lloyd Huger, MD   10 mL at 04/05/18 0800    There are no  Patient Instructions on file for this visit. No follow-ups on file.   Kris Hartmann, NP  This note was completed with Sales executive.  Any errors are purely unintentional.

## 2019-06-27 ENCOUNTER — Ambulatory Visit (INDEPENDENT_AMBULATORY_CARE_PROVIDER_SITE_OTHER): Payer: Medicare Other | Admitting: Nurse Practitioner

## 2019-06-27 ENCOUNTER — Encounter (INDEPENDENT_AMBULATORY_CARE_PROVIDER_SITE_OTHER): Payer: Self-pay

## 2019-06-27 ENCOUNTER — Other Ambulatory Visit: Payer: Self-pay

## 2019-06-27 VITALS — BP 148/68 | HR 64 | Resp 16 | Wt 268.0 lb

## 2019-06-27 DIAGNOSIS — I890 Lymphedema, not elsewhere classified: Secondary | ICD-10-CM

## 2019-06-27 NOTE — Progress Notes (Signed)
History of Present Illness  There is no documented history at this time  Assessments & Plan   There are no diagnoses linked to this encounter.    Additional instructions  Subjective:  Patient presents with venous ulcer of the Bilateral lower extremity.    Procedure:  3 layer unna wrap was placed Bilateral lower extremity.   Plan:   Follow up in one week.  

## 2019-07-03 ENCOUNTER — Encounter: Payer: Self-pay | Admitting: Podiatry

## 2019-07-03 ENCOUNTER — Other Ambulatory Visit: Payer: Self-pay

## 2019-07-03 ENCOUNTER — Ambulatory Visit (INDEPENDENT_AMBULATORY_CARE_PROVIDER_SITE_OTHER): Payer: Medicare Other | Admitting: Podiatry

## 2019-07-03 DIAGNOSIS — E1142 Type 2 diabetes mellitus with diabetic polyneuropathy: Secondary | ICD-10-CM

## 2019-07-03 DIAGNOSIS — M79674 Pain in right toe(s): Secondary | ICD-10-CM | POA: Diagnosis not present

## 2019-07-03 DIAGNOSIS — M79675 Pain in left toe(s): Secondary | ICD-10-CM | POA: Diagnosis not present

## 2019-07-03 DIAGNOSIS — B351 Tinea unguium: Secondary | ICD-10-CM | POA: Diagnosis not present

## 2019-07-03 NOTE — Progress Notes (Signed)
Complaint:  Visit Type: Patient returns to my office for continued preventative foot care services. Complaint: Patient states" my nails have grown long and thick and become painful to walk and wear shoes" Patient has been diagnosed with DM with no foot complications. Patient is wearing unna boots on both legs and feet. He says these are changed weekly and is due to have them changed Tuesday.  Patient nails and toes are dirty since he is unable to bathe because of the unna boots  B/L.  The patient presents for preventative foot care services. No changes to ROS  Podiatric Exam: Vascular: deferred this visit.  Sensorium: Deferred this visit. Nail Exam: Pt has thick disfigured discolored nails with subungual debris noted bilateral entire nail hallux through fifth toenails Ulcer Exam: There is no evidence of ulcer or pre-ulcerative changes or infection. Orthopedic Exam: Muscle tone and strength are WNL. No limitations in general ROM. No crepitus or effusions noted. Foot type and digits show no abnormalities. Bony prominences are unremarkable. Skin: No Porokeratosis. No infection or ulcers  Diagnosis:  Onychomycosis, , Pain in right toe, pain in left toes  Treatment & Plan Procedures and Treatment: Consent by patient was obtained for treatment procedures.   Debridement of mycotic and hypertrophic toenails, 1 through 5 bilateral and clearing of subungual debris. No ulceration, no infection noted.  Return Visit-Office Procedure: Patient instructed to return to the office for a follow up visit 3 months for continued evaluation and treatment.    Kennadee Walthour DPM 

## 2019-07-04 ENCOUNTER — Encounter (INDEPENDENT_AMBULATORY_CARE_PROVIDER_SITE_OTHER): Payer: Self-pay

## 2019-07-04 ENCOUNTER — Ambulatory Visit (INDEPENDENT_AMBULATORY_CARE_PROVIDER_SITE_OTHER): Payer: Medicare Other | Admitting: Nurse Practitioner

## 2019-07-04 VITALS — BP 146/78 | HR 99 | Resp 16 | Wt 276.0 lb

## 2019-07-04 DIAGNOSIS — I890 Lymphedema, not elsewhere classified: Secondary | ICD-10-CM

## 2019-07-04 NOTE — Progress Notes (Signed)
History of Present Illness  There is no documented history at this time  Assessments & Plan   There are no diagnoses linked to this encounter.    Additional instructions  Subjective:  Patient presents with venous ulcer of the Bilateral lower extremity.    Procedure:  3 layer unna wrap was placed Bilateral lower extremity.   Plan:   Follow up in one week.  

## 2019-07-06 ENCOUNTER — Other Ambulatory Visit: Payer: Self-pay

## 2019-07-07 ENCOUNTER — Other Ambulatory Visit: Payer: Self-pay

## 2019-07-07 ENCOUNTER — Ambulatory Visit
Admission: RE | Admit: 2019-07-07 | Discharge: 2019-07-07 | Disposition: A | Discharge status: Home / Self Care | Payer: Medicare Other | Source: Ambulatory Visit | Attending: Oncology | Admitting: Oncology

## 2019-07-07 ENCOUNTER — Inpatient Hospital Stay: Payer: Medicare Other | Attending: Oncology

## 2019-07-07 DIAGNOSIS — Z452 Encounter for adjustment and management of vascular access device: Secondary | ICD-10-CM | POA: Diagnosis not present

## 2019-07-07 DIAGNOSIS — C8318 Mantle cell lymphoma, lymph nodes of multiple sites: Secondary | ICD-10-CM

## 2019-07-07 DIAGNOSIS — Z95828 Presence of other vascular implants and grafts: Secondary | ICD-10-CM

## 2019-07-07 DIAGNOSIS — C8310 Mantle cell lymphoma, unspecified site: Secondary | ICD-10-CM | POA: Insufficient documentation

## 2019-07-07 DIAGNOSIS — J439 Emphysema, unspecified: Secondary | ICD-10-CM | POA: Diagnosis not present

## 2019-07-07 LAB — COMPREHENSIVE METABOLIC PANEL
ALT: 18 U/L (ref 0–44)
AST: 22 U/L (ref 15–41)
Albumin: 3.5 g/dL (ref 3.5–5.0)
Alkaline Phosphatase: 115 U/L (ref 38–126)
Anion gap: 8 (ref 5–15)
BUN: 20 mg/dL (ref 8–23)
CO2: 19 mmol/L — ABNORMAL LOW (ref 22–32)
Calcium: 8.2 mg/dL — ABNORMAL LOW (ref 8.9–10.3)
Chloride: 110 mmol/L (ref 98–111)
Creatinine, Ser: 1.47 mg/dL — ABNORMAL HIGH (ref 0.61–1.24)
GFR calc Af Amer: 55 mL/min — ABNORMAL LOW (ref >60)
GFR calc non Af Amer: 48 mL/min — ABNORMAL LOW (ref >60)
Glucose, Bld: 152 mg/dL — ABNORMAL HIGH (ref 70–99)
Potassium: 4.7 mmol/L (ref 3.5–5.1)
Sodium: 137 mmol/L (ref 135–145)
Total Bilirubin: 0.6 mg/dL (ref 0.3–1.2)
Total Protein: 6.5 g/dL (ref 6.5–8.1)

## 2019-07-07 LAB — CBC WITH DIFFERENTIAL/PLATELET
Abs Immature Granulocytes: 0.06 10*3/uL (ref 0.00–0.07)
Basophils Absolute: 0.1 10*3/uL (ref 0.0–0.1)
Basophils Relative: 1 %
Eosinophils Absolute: 0.1 10*3/uL (ref 0.0–0.5)
Eosinophils Relative: 2 %
HCT: 39.4 % (ref 39.0–52.0)
Hemoglobin: 12.6 g/dL — ABNORMAL LOW (ref 13.0–17.0)
Immature Granulocytes: 1 %
Lymphocytes Relative: 16 %
Lymphs Abs: 1 10*3/uL (ref 0.7–4.0)
MCH: 31.2 pg (ref 26.0–34.0)
MCHC: 32 g/dL (ref 30.0–36.0)
MCV: 97.5 fL (ref 80.0–100.0)
Monocytes Absolute: 0.5 10*3/uL (ref 0.1–1.0)
Monocytes Relative: 8 %
Neutro Abs: 4.5 10*3/uL (ref 1.7–7.7)
Neutrophils Relative %: 72 %
Platelets: 102 10*3/uL — ABNORMAL LOW (ref 150–400)
RBC: 4.04 MIL/uL — ABNORMAL LOW (ref 4.22–5.81)
RDW: 14 % (ref 11.5–15.5)
WBC: 6.3 10*3/uL (ref 4.0–10.5)
nRBC: 0 % (ref 0.0–0.2)

## 2019-07-07 MED ORDER — SODIUM CHLORIDE 0.9% FLUSH
10.0000 mL | Freq: Once | INTRAVENOUS | Status: AC
  Administered 2019-07-07: 10 mL via INTRAVENOUS
  Filled 2019-07-07: qty 10

## 2019-07-07 MED ORDER — IOHEXOL 300 MG/ML  SOLN
75.0000 mL | Freq: Once | INTRAMUSCULAR | Status: AC | PRN
  Administered 2019-07-07: 75 mL via INTRAVENOUS

## 2019-07-07 MED ORDER — HEPARIN SOD (PORK) LOCK FLUSH 100 UNIT/ML IV SOLN
500.0000 [IU] | Freq: Once | INTRAVENOUS | Status: AC
  Administered 2019-07-07: 500 [IU] via INTRAVENOUS

## 2019-07-10 ENCOUNTER — Other Ambulatory Visit: Payer: Medicare Other

## 2019-07-10 ENCOUNTER — Ambulatory Visit (INDEPENDENT_AMBULATORY_CARE_PROVIDER_SITE_OTHER): Payer: Medicare Other

## 2019-07-10 ENCOUNTER — Ambulatory Visit (INDEPENDENT_AMBULATORY_CARE_PROVIDER_SITE_OTHER): Payer: Medicare Other | Admitting: Internal Medicine

## 2019-07-10 ENCOUNTER — Other Ambulatory Visit: Payer: Self-pay

## 2019-07-10 VITALS — BP 118/60 | HR 100 | Temp 97.8°F | Resp 18 | Wt 277.2 lb

## 2019-07-10 DIAGNOSIS — I5022 Chronic systolic (congestive) heart failure: Secondary | ICD-10-CM | POA: Diagnosis not present

## 2019-07-10 DIAGNOSIS — N183 Chronic kidney disease, stage 3 (moderate): Secondary | ICD-10-CM | POA: Diagnosis not present

## 2019-07-10 DIAGNOSIS — I10 Essential (primary) hypertension: Secondary | ICD-10-CM

## 2019-07-10 DIAGNOSIS — I428 Other cardiomyopathies: Secondary | ICD-10-CM

## 2019-07-10 DIAGNOSIS — I429 Cardiomyopathy, unspecified: Secondary | ICD-10-CM

## 2019-07-10 DIAGNOSIS — M25552 Pain in left hip: Secondary | ICD-10-CM

## 2019-07-10 DIAGNOSIS — E7800 Pure hypercholesterolemia, unspecified: Secondary | ICD-10-CM

## 2019-07-10 DIAGNOSIS — N1831 Chronic kidney disease, stage 3a: Secondary | ICD-10-CM

## 2019-07-10 DIAGNOSIS — E1151 Type 2 diabetes mellitus with diabetic peripheral angiopathy without gangrene: Secondary | ICD-10-CM

## 2019-07-10 DIAGNOSIS — C8318 Mantle cell lymphoma, lymph nodes of multiple sites: Secondary | ICD-10-CM | POA: Diagnosis not present

## 2019-07-10 DIAGNOSIS — M1612 Unilateral primary osteoarthritis, left hip: Secondary | ICD-10-CM | POA: Diagnosis not present

## 2019-07-10 DIAGNOSIS — D649 Anemia, unspecified: Secondary | ICD-10-CM | POA: Diagnosis not present

## 2019-07-10 DIAGNOSIS — I2510 Atherosclerotic heart disease of native coronary artery without angina pectoris: Secondary | ICD-10-CM

## 2019-07-10 DIAGNOSIS — Z23 Encounter for immunization: Secondary | ICD-10-CM

## 2019-07-10 LAB — HM DIABETES FOOT EXAM

## 2019-07-10 NOTE — Progress Notes (Signed)
[name], male   DOB: 1946/07/28, 73 y.o.   MRN: [id number]   Subjective:    [name], male    DOB: [birth year], 73 y.o.   MRN: [id number]  HPI  Patient here for a scheduled follow up.  Has been followed by vascular surgery for his lower extremity swelling.  Legs are wrapped.  He is seeing them regularly.  He still has chronic sob with exertion.  He feels is stable.  Sees cardiology.  Last seen 05/04/19.  Per note, declined to start entrestro. Continue f/u with cardiology.  Request earlier f/u.  No chest pain.  No acid reflux.  No abdominal pain.  Bowels moving.  Recent a1c 6.5.  Having increased hip pain.  Affecting his walking.     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 with macular edema    Gastroparesis    Graves disease    Hypercholesterolemia    Hypertension    Mantle cell lymphoma ([place]) 07/2014   Peripheral neuropathy    Past Surgical History:  Procedure Laterality Date   CHOLECYSTECTOMY     NEPHRECTOMY     right   PACEMAKER INSERTION N/A 12/07/2017   Procedure: INSERTION PACEMAKER DUAL CHAMBER INITIAL INSERT;  Surgeon: Isaias Cowman, MD;  Location: [facility];  Service: Cardiovascular;  Laterality: N/A;   PORTA CATH INSERTION N/A 11/03/2017   Procedure: PORTA CATH INSERTION;  Surgeon: Katha Cabal, MD;  Location: [facility];  Service: Cardiovascular;  Laterality: N/A;   Family History  Problem Relation Age of Onset   Breast cancer Mother    Diabetes Father    Cancer Father        Lymphoma   Alcohol abuse Maternal Uncle    Diabetes Sister    Heart disease Other        grandfather   Social History   Socioeconomic History   Marital status: Married    Spouse name: Not on file   Number of children: 1   Years of education: Not on file   Highest education level: Not on file  Occupational History   Not on file    Social Needs   Financial resource strain: Not on file   Food insecurity    Worry: Not on file    Inability: Not on file   Transportation needs    Medical: Not on file    Non-medical: Not on file  Tobacco Use   Smoking status: Former Smoker   Smokeless tobacco: Current User    Types: Chew  Substance and Sexual Activity   Alcohol use: Yes    Alcohol/week: 3.0 standard drinks    Types: 3 Cans of beer per week    Comment: nightly   Drug use: Yes    Types: Codeine   Sexual activity: Not Currently  Lifestyle   Physical activity    Days per week: Not on file    Minutes per session: Not on file   Stress: Not on file  Relationships   Social connections    Talks on phone: Not on file    Gets together: Not on file    Attends religious service: Not on file    Active member of club or organization: Not on file    Attends meetings of clubs or organizations: Not on file    Relationship status: Not on file  Other Topics Concern  Not on file  Social History Narrative   Not on file    Outpatient Encounter Medications as of 07/10/2019  Medication Sig   ACCU-CHEK AVIVA PLUS test strip USE AS INSTRUCTED. USE 1 STRIP EVERY 3 HOURS . ACCU-CHECK AVIVA PLUS TEST STRIP. DX E10.649   carvedilol (COREG) 3.125 MG tablet Take 3.125 mg by mouth 2 (two) times daily with a meal.    cyanocobalamin (,VITAMIN B-12,) 1000 MCG/ML injection Inject into the muscle.   ferrous sulfate 325 (65 FE) MG tablet Take 325 mg by mouth daily.   GLUCAGON EMERGENCY 1 MG injection    glucagon, human recombinant, (GLUCAGEN DIAGNOSTIC) 1 MG injection Use as directed   glucose 4 GM chewable tablet Chew 1 tablet by mouth once as needed for low blood sugar.   glucose blood (PRECISION QID TEST) test strip Use 8 (eight) times daily as directed. BAYER CONTOUR NEXT TEST STRIPS E10.649   insulin aspart (NOVOLOG) 100 UNIT/ML injection Basal rate 12am 0.8 units per hour, 9am 0.9 units per hour. (total basal  insulin 20.7 units). Carbohydrate ratio 1 units for every 8gm of carbohydrate. Correction factor 1 units for every '50mg'$ /dl over target cbg. Target CBG 80-120   levothyroxine (SYNTHROID) 25 MCG tablet Take by mouth.   lisinopril (PRINIVIL,ZESTRIL) 20 MG tablet Take 20 mg by mouth daily.   Multiple Vitamin (MULTIVITAMIN) tablet Take 1 tablet by mouth daily.   pravastatin (PRAVACHOL) 40 MG tablet Take by mouth.   prochlorperazine (COMPAZINE) 10 MG tablet TAKE 1 TABLET (10 MG TOTAL) BY MOUTH EVERY 6 (SIX) HOURS AS NEEDED (NAUSEA OR VOMITING).   spironolactone (ALDACTONE) 25 MG tablet Take 25 mg by mouth daily.   [DISCONTINUED] cephALEXin (KEFLEX) 500 MG capsule Take 1 capsule (500 mg total) by mouth 2 (two) times daily.   [DISCONTINUED] doxycycline (VIBRAMYCIN) 100 MG capsule Take 1 capsule (100 mg total) by mouth 2 (two) times daily.   [DISCONTINUED] sulfamethoxazole-trimethoprim (BACTRIM) 400-80 MG tablet Take 1 tablet by mouth 2 (two) times daily.   Facility-Administered Encounter Medications as of 07/10/2019  Medication   heparin lock flush 100 unit/mL   sodium chloride flush (NS) 0.9 % injection 10 mL   sodium chloride flush (NS) 0.9 % injection 10 mL    Review of Systems  Constitutional: Negative for appetite change and unexpected weight change.  HENT: Negative for congestion and sinus pressure.   Respiratory: Positive for shortness of breath. Negative for cough and chest tightness.        Chronic sob.   Cardiovascular: Positive for leg swelling. Negative for chest pain and palpitations.  Gastrointestinal: Negative for abdominal pain, diarrhea, nausea and vomiting.  Genitourinary: Negative for difficulty urinating and dysuria.  Musculoskeletal: Negative for joint swelling and myalgias.       Hip pain as outlined.  Affects walking.    Skin: Negative for rash.  Neurological: Negative for dizziness, light-headedness and headaches.  Psychiatric/Behavioral: Negative for  agitation and dysphoric mood.       Objective:    Physical Exam Constitutional:      General: He is not in acute distress.    Appearance: Normal appearance. He is well-developed.  HENT:     Right Ear: External ear normal.     Left Ear: External ear normal.  Eyes:     General: No scleral icterus.       Right eye: No discharge.        Left eye: No discharge.     Conjunctiva/sclera: Conjunctivae normal.  Neck:  Musculoskeletal: Neck supple. No muscular tenderness.  Cardiovascular:     Rate and Rhythm: Normal rate and regular rhythm.  Pulmonary:     Effort: Pulmonary effort is normal. No respiratory distress.     Breath sounds: Normal breath sounds.  Abdominal:     General: Bowel sounds are normal.     Palpations: Abdomen is soft.     Tenderness: There is no abdominal tenderness.  Musculoskeletal:        General: Swelling present.     Comments: Increased pedal and lower extremity edema.  Wrapped.    Lymphadenopathy:     Cervical: No cervical adenopathy.  Skin:    Coloration: Skin is not pale.     Findings: No rash.  Neurological:     Mental Status: He is alert.  Psychiatric:        Mood and Affect: Mood normal.        Behavior: Behavior normal.     BP 118/60    Pulse 100    Temp 97.8 F (36.6 C)    Resp 18    Wt 277 lb 3.2 oz (125.7 kg)    SpO2 99%    BMI 39.77 kg/m  Wt Readings from Last 3 Encounters:  07/11/19 275 lb 12.8 oz (125.1 kg)  07/10/19 277 lb 3.2 oz (125.7 kg)  07/04/19 276 lb (125.2 kg)     Lab Results  Component Value Date   WBC 6.3 07/07/2019   HGB 12.6 (L) 07/07/2019   HCT 39.4 07/07/2019   PLT 102 (L) 07/07/2019   GLUCOSE 152 (H) 07/07/2019   CHOL 197 08/09/2015   TRIG 51.0 08/09/2015   HDL 73.60 08/09/2015   LDLCALC 113 (H) 08/09/2015   ALT 18 07/07/2019   AST 22 07/07/2019   NA 137 07/07/2019   K 4.7 07/07/2019   CL 110 07/07/2019   CREATININE 1.47 (H) 07/07/2019   BUN 20 07/07/2019   CO2 19 (L) 07/07/2019   TSH 3.894  10/28/2017   INR 1.12 07/14/2018   HGBA1C 5.6 10/28/2017   MICROALBUR 1.9 09/22/2016    Ct Chest W Contrast  Result Date: 07/07/2019 CLINICAL DATA:  Restaging of mantle cell lymphoma EXAM: CT CHEST, ABDOMEN, AND PELVIS WITH CONTRAST TECHNIQUE: Multidetector CT imaging of the chest, abdomen and pelvis was performed following the standard protocol during bolus administration of intravenous contrast. CONTRAST:  42m OMNIPAQUE IOHEXOL 300 MG/ML  SOLN COMPARISON:  Multiple exams, including 01/02/2019 FINDINGS: CT CHEST FINDINGS Cardiovascular: Right Port-A-Cath tip: Cavoatrial junction. Dual lead left pacer device noted. Left anterior descending and right coronary artery atherosclerotic vascular disease. Mild proximal circumflex coronary artery atherosclerosis. Mediastinum/Nodes: Unremarkable Lungs/Pleura: Mild paraseptal emphysema. Subsegmental atelectasis or scarring anteriorly in the right upper lobe punctate lingular calcification compatible with old granulomatous disease. Musculoskeletal: Right proximal humeral deformity from old healed fracture. Old healed left posterior rib fractures. Mild thoracic kyphosis and spondylosis. CT ABDOMEN PELVIS FINDINGS Hepatobiliary: Cholecystectomy.  Otherwise unremarkable. Pancreas: Unremarkable Spleen: Unremarkable Adrenals/Urinary Tract: Absent right kidney. Adrenal glands normal. Exophytic 1.3 by 0.9 by 0.9 cm fluid density lesion from the left kidney lower pole compatible with cyst. Urinary bladder unremarkable. Stomach/Bowel: Unremarkable Vascular/Lymphatic: Aortoiliac atherosclerotic vascular disease. Left common iliac node 1.2 cm in short axis on image 90/2, formerly 1.3 cm in short axis. A left periaortic lymph node measures 1.0 cm in short axis on image 84/2, previously 0.9 cm. A right common iliac node measures 1.0 cm in short axis on image 99/2, formerly the same. Additional  small retroperitoneal and pelvic lymph nodes are present. Reproductive: Calcification of  the vas deferens. Otherwise unremarkable. Other: No supplemental non-categorized findings. Musculoskeletal: Unremarkable IMPRESSION: 1. Roughly stable mild upper pelvic adenopathy and borderline retroperitoneal adenopathy. The largest node is a left common iliac node currently measuring 1.2 cm in short axis, previously 1.3 cm. 2. Other imaging findings of potential clinical significance: Aortic Atherosclerosis (ICD10-I70.0) and Emphysema (ICD10-J43.9). Coronary atherosclerosis. Dual lead pacer in place. Absent right kidney. Electronically Signed   By: Van Clines M.D.   On: 07/07/2019 13:17   Ct Abdomen Pelvis W Contrast  Result Date: 07/07/2019 CLINICAL DATA:  Restaging of mantle cell lymphoma EXAM: CT CHEST, ABDOMEN, AND PELVIS WITH CONTRAST TECHNIQUE: Multidetector CT imaging of the chest, abdomen and pelvis was performed following the standard protocol during bolus administration of intravenous contrast. CONTRAST:  40m OMNIPAQUE IOHEXOL 300 MG/ML  SOLN COMPARISON:  Multiple exams, including 01/02/2019 FINDINGS: CT CHEST FINDINGS Cardiovascular: Right Port-A-Cath tip: Cavoatrial junction. Dual lead left pacer device noted. Left anterior descending and right coronary artery atherosclerotic vascular disease. Mild proximal circumflex coronary artery atherosclerosis. Mediastinum/Nodes: Unremarkable Lungs/Pleura: Mild paraseptal emphysema. Subsegmental atelectasis or scarring anteriorly in the right upper lobe punctate lingular calcification compatible with old granulomatous disease. Musculoskeletal: Right proximal humeral deformity from old healed fracture. Old healed left posterior rib fractures. Mild thoracic kyphosis and spondylosis. CT ABDOMEN PELVIS FINDINGS Hepatobiliary: Cholecystectomy.  Otherwise unremarkable. Pancreas: Unremarkable Spleen: Unremarkable Adrenals/Urinary Tract: Absent right kidney. Adrenal glands normal. Exophytic 1.3 by 0.9 by 0.9 cm fluid density lesion from the left kidney  lower pole compatible with cyst. Urinary bladder unremarkable. Stomach/Bowel: Unremarkable Vascular/Lymphatic: Aortoiliac atherosclerotic vascular disease. Left common iliac node 1.2 cm in short axis on image 90/2, formerly 1.3 cm in short axis. A left periaortic lymph node measures 1.0 cm in short axis on image 84/2, previously 0.9 cm. A right common iliac node measures 1.0 cm in short axis on image 99/2, formerly the same. Additional small retroperitoneal and pelvic lymph nodes are present. Reproductive: Calcification of the vas deferens. Otherwise unremarkable. Other: No supplemental non-categorized findings. Musculoskeletal: Unremarkable IMPRESSION: 1. Roughly stable mild upper pelvic adenopathy and borderline retroperitoneal adenopathy. The largest node is a left common iliac node currently measuring 1.2 cm in short axis, previously 1.3 cm. 2. Other imaging findings of potential clinical significance: Aortic Atherosclerosis (ICD10-I70.0) and Emphysema (ICD10-J43.9). Coronary atherosclerosis. Dual lead pacer in place. Absent right kidney. Electronically Signed   By: WVan ClinesM.D.   On: 07/07/2019 13:17       Assessment & Plan:   Problem List Items Addressed This Visit    Anemia    Followed by oncology.       CAD in native artery    Continue risk factor modification.  Followed by cardiology.        [place]    Followed by cardiology.  Per note, declined to start entrestro.  Chronic sob.  Discussed f/u. Schedule f/u with cardiology.        Chronic systolic CHF (congestive heart failure) [place]    Followed by cardiology.       CKD (chronic kidney disease) stage 3, GFR 30-59 ml/min    Followed by nephrology.  Avoid antiinflammatories.        Diabetes [place]    Followed by endocrinology.  Last a1c 6.5.  Follow met b and a1c.        Essential hypertension, benign    Blood pressure under good control.  Continue same  medication regimen.  Follow pressures.   Follow metabolic panel.        Hypercholesterolemia    On pravastatin.  Low cholesterol diet and exercise.  Follow lipid panel and liver function tests.        Left hip pain - Primary    Increased left hip pain.  Affecting his walking.  Check xray.        Relevant Orders   DG HIP UNILAT WITH PELVIS 2-3 VIEWS LEFT (Completed)   Mantle cell lymphoma ([place])    Followed by oncology.        Other Visit Diagnoses    Need for immunization against influenza       Relevant Orders   Flu Vaccine QUAD High Dose(Fluad) (Completed)       [name], MD

## 2019-07-11 ENCOUNTER — Ambulatory Visit (INDEPENDENT_AMBULATORY_CARE_PROVIDER_SITE_OTHER): Payer: Medicare Other | Admitting: Nurse Practitioner

## 2019-07-11 ENCOUNTER — Other Ambulatory Visit: Payer: Self-pay | Admitting: Internal Medicine

## 2019-07-11 ENCOUNTER — Ambulatory Visit: Payer: Medicare Other | Admitting: Oncology

## 2019-07-11 ENCOUNTER — Encounter (INDEPENDENT_AMBULATORY_CARE_PROVIDER_SITE_OTHER): Payer: Self-pay

## 2019-07-11 VITALS — BP 150/71 | HR 88 | Resp 18 | Wt 275.8 lb

## 2019-07-11 DIAGNOSIS — I890 Lymphedema, not elsewhere classified: Secondary | ICD-10-CM

## 2019-07-11 DIAGNOSIS — M25552 Pain in left hip: Secondary | ICD-10-CM

## 2019-07-11 NOTE — Progress Notes (Signed)
History of Present Illness  There is no documented history at this time  Assessments & Plan   There are no diagnoses linked to this encounter.    Additional instructions  Subjective:  Patient presents with venous ulcer of the Bilateral lower extremity.    Procedure:  3 layer unna wrap was placed Bilateral lower extremity.   Plan:   Follow up in one week.  

## 2019-07-11 NOTE — Progress Notes (Signed)
Order placed for ortho referral.   

## 2019-07-16 ENCOUNTER — Encounter: Payer: Self-pay | Admitting: Internal Medicine

## 2019-07-16 DIAGNOSIS — M25552 Pain in left hip: Secondary | ICD-10-CM | POA: Insufficient documentation

## 2019-07-16 NOTE — Assessment & Plan Note (Signed)
Continue risk factor modification.  Followed by cardiology.  

## 2019-07-16 NOTE — Assessment & Plan Note (Signed)
Followed by nephrology.  Avoid antiinflammatories.

## 2019-07-16 NOTE — Assessment & Plan Note (Signed)
Blood pressure under good control.  Continue same medication regimen.  Follow pressures.  Follow metabolic panel.   

## 2019-07-16 NOTE — Assessment & Plan Note (Signed)
Followed by oncology 

## 2019-07-16 NOTE — Assessment & Plan Note (Signed)
Followed by cardiology 

## 2019-07-16 NOTE — Assessment & Plan Note (Signed)
Increased left hip pain.  Affecting his walking.  Check xray.

## 2019-07-16 NOTE — Assessment & Plan Note (Signed)
On pravastatin.  Low cholesterol diet and exercise.  Follow lipid panel and liver function tests.   

## 2019-07-16 NOTE — Assessment & Plan Note (Signed)
Followed by cardiology.  Per note, declined to start entrestro.  Chronic sob.  Discussed f/u. Schedule f/u with cardiology.

## 2019-07-16 NOTE — Assessment & Plan Note (Signed)
Followed by endocrinology.  Last a1c 6.5.  Follow met b and a1c.

## 2019-07-18 ENCOUNTER — Ambulatory Visit (INDEPENDENT_AMBULATORY_CARE_PROVIDER_SITE_OTHER): Payer: Medicare Other | Admitting: Nurse Practitioner

## 2019-07-18 ENCOUNTER — Other Ambulatory Visit: Payer: Self-pay

## 2019-07-18 ENCOUNTER — Telehealth (INDEPENDENT_AMBULATORY_CARE_PROVIDER_SITE_OTHER): Payer: Self-pay

## 2019-07-18 ENCOUNTER — Encounter (INDEPENDENT_AMBULATORY_CARE_PROVIDER_SITE_OTHER): Payer: Self-pay | Admitting: Nurse Practitioner

## 2019-07-18 VITALS — BP 135/75 | HR 88 | Resp 18 | Wt 275.0 lb

## 2019-07-18 DIAGNOSIS — I5022 Chronic systolic (congestive) heart failure: Secondary | ICD-10-CM | POA: Diagnosis not present

## 2019-07-18 DIAGNOSIS — E785 Hyperlipidemia, unspecified: Secondary | ICD-10-CM | POA: Diagnosis not present

## 2019-07-18 DIAGNOSIS — L97909 Non-pressure chronic ulcer of unspecified part of unspecified lower leg with unspecified severity: Secondary | ICD-10-CM

## 2019-07-18 DIAGNOSIS — E10649 Type 1 diabetes mellitus with hypoglycemia without coma: Secondary | ICD-10-CM | POA: Diagnosis not present

## 2019-07-18 DIAGNOSIS — K219 Gastro-esophageal reflux disease without esophagitis: Secondary | ICD-10-CM

## 2019-07-18 DIAGNOSIS — E1159 Type 2 diabetes mellitus with other circulatory complications: Secondary | ICD-10-CM | POA: Diagnosis not present

## 2019-07-18 DIAGNOSIS — I10 Essential (primary) hypertension: Secondary | ICD-10-CM

## 2019-07-18 DIAGNOSIS — E1069 Type 1 diabetes mellitus with other specified complication: Secondary | ICD-10-CM | POA: Diagnosis not present

## 2019-07-18 DIAGNOSIS — I872 Venous insufficiency (chronic) (peripheral): Secondary | ICD-10-CM

## 2019-07-18 MED ORDER — AMOXICILLIN-POT CLAVULANATE 875-125 MG PO TABS: 1.0000 | ORAL_TABLET | Freq: Two times a day (BID) | ORAL | 0 refills | Status: DC

## 2019-07-18 NOTE — Telephone Encounter (Signed)
Patient seen today with [name] for unna check and she had some concerns. The patient has swelling,blisters,and weeping with the legs. [name] informed the patient legs looks worst then previously. He also seems more short of breath and also abdominal swelling. Patient had reported that Dr Nehemiah Massed had stop the Lasix and Arna Medici NP would like to know can we restart the Lasix or should the patient be seen in their office. I spoke with Claiborne Billings from Dr Nehemiah Massed and informed the patient can restart taking Lasix 20mg  1 tablet daily. Claiborne Billings wanted to informed that they wanted to be careful because the patient creatinine was 2.4 in June and the patient does have an appointment with Dr Nehemiah Massed in November. Patient has been advise with information

## 2019-07-18 NOTE — Progress Notes (Signed)
SUBJECTIVE:  [name], male    DOB: 1946-03-23, 73 y.o.   MRN: [id number] Chief Complaint  Patient presents with   Follow-up    unna check    HPI  Gary [name] is a 73 y.o. male the presents today for evaluation of his lower extremities following Unna wrap therapy.  Since the previous office visit, the patient's lower extremity edema has greatly worsened as well as his ulcerations.  The patient has also began to develop weeping.  The patient does report that he was taken off his Lasix and has not had any in some time.  The patient does not endorse a history of great weight gain however examining previous visits it appears that the patient has gained about 10 pounds over the last few weeks.  He appears a little bit more distended the stomach area and he is somewhat short of breath.  He denies any chest pain.  He denies any fever, chills, nausea, vomiting or diarrhea.  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 with macular edema    Gastroparesis    Graves disease    Hypercholesterolemia    Hypertension    Mantle cell lymphoma ([place]) 07/2014   Peripheral neuropathy     Past Surgical History:  Procedure Laterality Date   CHOLECYSTECTOMY     NEPHRECTOMY     right   PACEMAKER INSERTION N/A 12/07/2017   Procedure: INSERTION PACEMAKER DUAL CHAMBER INITIAL INSERT;  Surgeon: Gary Cowman, MD;  Location: [facility];  Service: Cardiovascular;  Laterality: N/A;   PORTA CATH INSERTION N/A 11/03/2017   Procedure: PORTA CATH INSERTION;  Surgeon: Gary Cabal, MD;  Location: [place] [facility];  Service: Cardiovascular;  Laterality: N/A;    Social History   Socioeconomic History   Marital status: Married    Spouse name: Not on file   Number of children: 1   Years of education: Not on file   Highest education level: Not on file  Occupational History   Not on  file  Social Needs   Financial resource strain: Not on file   Food insecurity    Worry: Not on file    Inability: Not on file   Transportation needs    Medical: Not on file    Non-medical: Not on file  Tobacco Use   Smoking status: Former Smoker   Smokeless tobacco: Current User    Types: Chew  Substance and Sexual Activity   Alcohol use: Yes    Alcohol/week: 3.0 standard drinks    Types: 3 Cans of beer per week    Comment: nightly   Drug use: Yes    Types: Codeine   Sexual activity: Not Currently  Lifestyle   Physical activity    Days per week: Not on file    Minutes per session: Not on file   Stress: Not on file  Relationships   Social connections    Talks on phone: Not on file    Gets together: Not on file    Attends religious service: Not on file    Active member of club or organization: Not on file    Attends meetings of clubs or organizations: Not on file    Relationship status: Not on file   Intimate partner violence    Fear of current or ex partner: Not on file    Emotionally abused: Not on file  Physically abused: Not on file    Forced sexual activity: Not on file  Other Topics Concern   Not on file  Social History Narrative   Not on file    Family History  Problem Relation Age of Onset   Breast cancer Mother    Diabetes Father    Cancer Father        Lymphoma   Alcohol abuse Maternal Uncle    Diabetes Sister    Heart disease Other        grandfather    Allergies  Allergen Reactions   Rofecoxib Nausea Only     Review of Systems   Review of Systems: Negative Unless Checked Constitutional: [] Weight loss  [] Fever  [] Chills Cardiac: [] Chest pain   []  Atrial Fibrillation  [] Palpitations   [] Shortness of breath when laying flat   [] Shortness of breath with exertion. [x] Shortness of breath at rest Vascular:  [] Pain in legs with walking   [] Pain in legs with standing [] Pain in legs when laying flat   [] Claudication    [] Pain  in feet when laying flat    [] History of DVT   [] Phlebitis   [x] Swelling in legs   [] Varicose veins   [] Non-healing ulcers Pulmonary:   [] Uses home oxygen   [] Productive cough   [] Hemoptysis   [] Wheeze  [] COPD   [] Asthma Neurologic:  [] Dizziness   [] Seizures  [] Blackouts [] History of stroke   [] History of TIA  [] Aphasia   [] Temporary Blindness   [] Weakness or numbness in arm   [] Weakness or numbness in leg Musculoskeletal:   [] Joint swelling   [] Joint pain   [] Low back pain  []  History of Knee Replacement [] Arthritis [] back Surgeries  []  Spinal Stenosis    Hematologic:  [] Easy bruising  [] Easy bleeding   [] Hypercoagulable state   [] Anemic Gastrointestinal:  [] Diarrhea   [] Vomiting  [] Gastroesophageal reflux/heartburn   [] Difficulty swallowing. [] Abdominal pain Genitourinary:  [x] Chronic kidney disease   [] Difficult urination  [] Anuric   [] Blood in urine [] Frequent urination  [] Burning with urination   [] Hematuria Skin:  [] Rashes   [x] Ulcers [x] Wounds Psychological:  [] History of anxiety   [x]  History of major depression  []  Memory Difficulties      OBJECTIVE:   Physical Exam  BP 135/75 (BP Location: Right Arm)    Pulse 88    Resp 18    Wt 275 lb (124.7 kg)    BMI 39.46 kg/m   Gen: WD/WN, NAD Head: [place]/AT, No temporalis wasting.  Ear/Nose/Throat: Hearing grossly intact, nares w/o erythema or drainage Eyes: PER, EOMI, sclera nonicteric.  Neck: Supple, no masses.  No JVD.  Pulmonary:  Good air movement, no use of accessory muscles.  Cardiac: RRR Vascular: 4+ pitting edema bilaterally, Vessel Right Left  Radial Palpable Palpable  Brachial Palpable Palpable  Femoral Palpable Palpable  Popliteal Palpable Palpable  Dorsalis Pedis Palpable Palpable  Posterior Tibial Palpable Palpable   Gastrointestinal: soft, non-distended. No guarding/no peritoneal signs.  Musculoskeletal: M/S 5/5 throughout.  No deformity or atrophy.  Neurologic: Pain and light touch intact in extremities.  Symmetrical.   Speech is fluent. Motor exam as listed above. Psychiatric: Judgment intact, Mood & affect appropriate for pt's clinical situation. Dermatologic: Bilateral Ulcers, worsening redness consistent with cellulitis  Lymph : No Cervical lymphadenopathy, no lichenification or skin changes of chronic lymphedema.       ASSESSMENT AND PLAN:  1. Venous ulcer of lower leg without varicose veins (HCC) We will have the patient return office to have his [employer]  wraps changed twice a week due to weeping and drainage.  This is due to the fact that continued wet dressings will further worsen his ulcerations as well as his cellulitis.  The patient has responded well to Bactrim in the past so we will prescribe this.  I suspect a component of this weeping and swelling is related to his heart failure.  See below  2. Chronic systolic CHF (congestive heart failure) ([place]) The patient currently has not been taking Lasix.  He was taken off of this by his cardiologist.  I I will have my office reach out to his cardiologist office to determine further steps.  It is likely that his worsening swelling, weeping and furthering of his ulceration is due to fluid retention.  3. Essential hypertension, benign Continue antihypertensive medications as already ordered, these medications have been reviewed and there are no changes at this time.   4. Gastroesophageal reflux disease, unspecified whether esophagitis present Continue PPI as already ordered, this medication has been reviewed and there are no changes at this time.  Avoidence of caffeine and alcohol  Moderate elevation of the head of the bed    Current Outpatient Medications on File Prior to Visit  Medication Sig Dispense Refill   ACCU-CHEK AVIVA PLUS test strip USE AS INSTRUCTED. USE 1 STRIP EVERY 3 HOURS . ACCU-CHECK AVIVA PLUS TEST STRIP. DX E10.649  12   cyanocobalamin (,VITAMIN B-12,) 1000 MCG/ML injection Inject into the muscle.     ferrous sulfate 325 (65 FE)  MG tablet Take 325 mg by mouth daily.     GLUCAGON EMERGENCY 1 MG injection      glucagon, human recombinant, (GLUCAGEN DIAGNOSTIC) 1 MG injection Use as directed     glucose 4 GM chewable tablet Chew 1 tablet by mouth once as needed for low blood sugar.     glucose blood (PRECISION QID TEST) test strip Use 8 (eight) times daily as directed. BAYER CONTOUR NEXT TEST STRIPS E10.649     insulin aspart (NOVOLOG) 100 UNIT/ML injection Basal rate 12am 0.8 units per hour, 9am 0.9 units per hour. (total basal insulin 20.7 units). Carbohydrate ratio 1 units for every 8gm of carbohydrate. Correction factor 1 units for every 50mg /dl over target cbg. Target CBG 80-120 1 vial 12   levothyroxine (SYNTHROID) 25 MCG tablet Take by mouth.     lisinopril (PRINIVIL,ZESTRIL) 20 MG tablet Take 20 mg by mouth daily.     Multiple Vitamin (MULTIVITAMIN) tablet Take 1 tablet by mouth daily.     pravastatin (PRAVACHOL) 40 MG tablet Take by mouth.     prochlorperazine (COMPAZINE) 10 MG tablet TAKE 1 TABLET (10 MG TOTAL) BY MOUTH EVERY 6 (SIX) HOURS AS NEEDED (NAUSEA OR VOMITING).     spironolactone (ALDACTONE) 25 MG tablet Take 25 mg by mouth daily.     carvedilol (COREG) 3.125 MG tablet Take 3.125 mg by mouth 2 (two) times daily with a meal.      Current Facility-Administered Medications on File Prior to Visit  Medication Dose Route Frequency Provider Last Rate Last Dose   heparin lock flush 100 unit/mL  500 Units Intravenous Once Lloyd Huger, MD       sodium chloride flush (NS) 0.9 % injection 10 mL  10 mL Intravenous PRN Lloyd Huger, MD   10 mL at 02/01/18 0829   sodium chloride flush (NS) 0.9 % injection 10 mL  10 mL Intravenous PRN Lloyd Huger, MD   10 mL at 04/05/18 0800  There are no Patient Instructions on file for this visit. No follow-ups on file.   Kris Hartmann, NP  This note was completed with Sales executive.  Any errors are purely unintentional.

## 2019-07-21 ENCOUNTER — Other Ambulatory Visit: Payer: Self-pay

## 2019-07-21 ENCOUNTER — Encounter (INDEPENDENT_AMBULATORY_CARE_PROVIDER_SITE_OTHER): Payer: Self-pay

## 2019-07-21 ENCOUNTER — Ambulatory Visit (INDEPENDENT_AMBULATORY_CARE_PROVIDER_SITE_OTHER): Payer: Medicare Other | Admitting: Nurse Practitioner

## 2019-07-21 VITALS — BP 150/83 | HR 94 | Resp 16 | Wt 269.4 lb

## 2019-07-21 DIAGNOSIS — I890 Lymphedema, not elsewhere classified: Secondary | ICD-10-CM | POA: Diagnosis not present

## 2019-07-21 NOTE — Progress Notes (Signed)
History of Present Illness  There is no documented history at this time  Assessments & Plan   There are no diagnoses linked to this encounter.    Additional instructions  Subjective:  Patient presents with venous ulcer of the Bilateral lower extremity.    Procedure:  3 layer unna wrap was placed Bilateral lower extremity.   Plan:   Follow up in one week.  

## 2019-07-24 NOTE — Progress Notes (Signed)
[place]  [phone] [phone]) [phone]  ID: Gary [name] OB: March 26, 1946  MR#: [id number]  YPP#:[id number]  Patient Care Team: [name], MD as PCP - General (Internal Medicine) Lloyd Huger, MD as Consulting Physician (Oncology) Lucky Cowboy Erskine Squibb, MD as Referring Physician (Vascular Surgery) Schnier, Dolores Lory, MD (Vascular Surgery) Corey Skains, MD as Consulting Physician (Cardiology)  CHIEF COMPLAINT: Mantle cell lymphoma.  INTERVAL HISTORY: Patient returns to clinic today for further evaluation and discussion of his imaging results.  He continues to feel well and remains asymptomatic.  He denies any weakness or fatigue. He denies any fevers, chills, or night sweats. He has a good appetite and denies weight loss. He has no neurologic complaints.  He denies any chest pain, shortness of breath, cough, or hemoptysis.  He has no nausea, vomiting, constipation, or diarrhea.  He denies any melena or hematochezia.  He has no urinary complaints.  Patient offers no specific complaints today.  Review of Systems  Constitutional: Negative.  Negative for diaphoresis, fever, malaise/fatigue and weight loss.  Respiratory: Negative.  Negative for cough, hemoptysis and shortness of breath.   Cardiovascular: Negative.  Negative for chest pain and leg swelling.  Gastrointestinal: Negative.  Negative for abdominal pain.  Genitourinary: Negative.  Negative for dysuria.  Musculoskeletal: Negative.  Negative for back pain.  Skin: Negative.  Negative for rash.  Neurological: Negative.  Negative for dizziness, focal weakness, weakness and headaches.  Psychiatric/Behavioral: Negative.  The patient is not nervous/anxious.    As per HPI. Otherwise, a complete review of systems is negative.  PAST MEDICAL HISTORY: Past Medical History:  Diagnosis Date  . Depression   . Diabetes mellitus due to underlying condition with diabetic retinopathy with macular edema    . Gastroparesis   . Graves disease   . Hypercholesterolemia   . Hypertension   . Mantle cell lymphoma ([name]) 07/2014  . Peripheral neuropathy     PAST SURGICAL HISTORY: Past Surgical History:  Procedure Laterality Date  . CHOLECYSTECTOMY    . NEPHRECTOMY     right  . PACEMAKER INSERTION N/A 12/07/2017   Procedure: INSERTION PACEMAKER DUAL CHAMBER INITIAL INSERT;  Surgeon: Isaias Cowman, MD;  Location: [facility] ORS;  Service: Cardiovascular;  Laterality: N/A;  . PORTA CATH INSERTION N/A 11/03/2017   Procedure: PORTA CATH INSERTION;  Surgeon: Katha Cabal, MD;  Location: [place] [facility];  Service: Cardiovascular;  Laterality: N/A;    FAMILY HISTORY Family History  Problem Relation Age of Onset  . Breast cancer Mother   . Diabetes Father   . Cancer Father        Lymphoma  . Alcohol abuse Maternal Uncle   . Diabetes Sister   . Heart disease Other        grandfather       ADVANCED DIRECTIVES:    HEALTH MAINTENANCE: Social History   Tobacco Use  . Smoking status: Former Research scientist (life sciences)  . Smokeless tobacco: Current User    Types: Chew  Substance Use Topics  . Alcohol use: Yes    Alcohol/week: 3.0 standard drinks    Types: 3 Cans of beer per week    Comment: nightly  . Drug use: Yes    Types: Codeine     Colonoscopy:  PAP:  Bone density:  Lipid panel:  Allergies  Allergen Reactions  . Rofecoxib Nausea Only    Current Outpatient Medications  Medication Sig Dispense Refill  . ACCU-CHEK AVIVA PLUS test strip USE AS INSTRUCTED.  USE 1 STRIP EVERY 3 HOURS . ACCU-CHECK AVIVA PLUS TEST STRIP. DX E10.649  12  . carvedilol (COREG) 3.125 MG tablet Take 3.125 mg by mouth 2 (two) times daily with a meal.     . cyanocobalamin (,VITAMIN B-12,) 1000 MCG/ML injection Inject into the muscle.    . ferrous sulfate 325 (65 FE) MG tablet Take 325 mg by mouth daily.    Marland Kitchen GLUCAGON EMERGENCY 1 MG injection     . glucagon, human recombinant, (GLUCAGEN DIAGNOSTIC) 1 MG  injection Use as directed    . glucose 4 GM chewable tablet Chew 1 tablet by mouth once as needed for low blood sugar.    Marland Kitchen glucose blood (PRECISION QID TEST) test strip Use 8 (eight) times daily as directed. BAYER CONTOUR NEXT TEST STRIPS E10.649    . insulin aspart (NOVOLOG) 100 UNIT/ML injection Basal rate 12am 0.8 units per hour, 9am 0.9 units per hour. (total basal insulin 20.7 units). Carbohydrate ratio 1 units for every 8gm of carbohydrate. Correction factor 1 units for every 50mg /dl over target cbg. Target CBG 80-120 1 vial 12  . levothyroxine (SYNTHROID) 25 MCG tablet Take by mouth.    Marland Kitchen lisinopril (PRINIVIL,ZESTRIL) 20 MG tablet Take 20 mg by mouth daily.    . Multiple Vitamin (MULTIVITAMIN) tablet Take 1 tablet by mouth daily.    . pravastatin (PRAVACHOL) 40 MG tablet Take by mouth.    . prochlorperazine (COMPAZINE) 10 MG tablet TAKE 1 TABLET (10 MG TOTAL) BY MOUTH EVERY 6 (SIX) HOURS AS NEEDED (NAUSEA OR VOMITING).    Marland Kitchen spironolactone (ALDACTONE) 25 MG tablet Take 25 mg by mouth daily.    Marland Kitchen amoxicillin-clavulanate (AUGMENTIN) 875-125 MG tablet Take 1 tablet by mouth 2 (two) times daily. (Patient not taking: Reported on 07/25/2019) 20 tablet 0   No current facility-administered medications for this visit.    Facility-Administered Medications Ordered in Other Visits  Medication Dose Route Frequency Provider Last Rate Last Dose  . heparin lock flush 100 unit/mL  500 Units Intravenous Once Lloyd Huger, MD      . sodium chloride flush (NS) 0.9 % injection 10 mL  10 mL Intravenous PRN Lloyd Huger, MD   10 mL at 02/01/18 0829  . sodium chloride flush (NS) 0.9 % injection 10 mL  10 mL Intravenous PRN Lloyd Huger, MD   10 mL at 04/05/18 0800    OBJECTIVE: Vitals:   07/26/19 1106  BP: 129/63  Pulse: 97  Temp: (!) 96.4 F (35.8 C)  SpO2: 99%     Body mass index is 38.31 kg/m.    ECOG FS:0 - Asymptomatic  General: Well-developed, well-nourished, no acute  distress. Eyes: Pink conjunctiva, anicteric sclera. HEENT: Normocephalic, moist mucous membranes. Lungs: Clear to auscultation bilaterally. Heart: Regular rate and rhythm. No rubs, murmurs, or gallops. Abdomen: Soft, nontender, nondistended. No organomegaly noted, normoactive bowel sounds. Musculoskeletal: No edema, cyanosis, or clubbing. Neuro: Alert, answering all questions appropriately. Cranial nerves grossly intact. Skin: No rashes or petechiae noted. Psych: Normal affect. Lymphatics: No cervical, calvicular, axillary or inguinal LAD.  LAB RESULTS:  Lab Results  Component Value Date   NA 137 07/07/2019   K 4.7 07/07/2019   CL 110 07/07/2019   CO2 19 (L) 07/07/2019   GLUCOSE 152 (H) 07/07/2019   BUN 20 07/07/2019   CREATININE 1.47 (H) 07/07/2019   CALCIUM 8.2 (L) 07/07/2019   PROT 6.5 07/07/2019   ALBUMIN 3.5 07/07/2019   AST 22 07/07/2019  ALT 18 07/07/2019   ALKPHOS 115 07/07/2019   BILITOT 0.6 07/07/2019   GFRNONAA 48 (L) 07/07/2019   GFRAA 55 (L) 07/07/2019    Lab Results  Component Value Date   WBC 6.3 07/07/2019   NEUTROABS 4.5 07/07/2019   HGB 12.6 (L) 07/07/2019   HCT 39.4 07/07/2019   MCV 97.5 07/07/2019   PLT 102 (L) 07/07/2019   Lab Results  Component Value Date   IRON 13 (L) 08/11/2017   TIBC 223 (L) 01/12/2017   IRONPCTSAT 3.8 (L) 08/11/2017   Lab Results  Component Value Date   FERRITIN 42.0 08/11/2017     STUDIES:   ASSESSMENT: Low-grade mantle cell lymphoma diagnosed in September 2015, iron deficiency anemia.  PLAN:    1. Mantle cell lymphoma, stage III: Patient's initial diagnosis was on inguinal lymph node biopsy on July 12, 2014.  Patient was noted to have progressive disease and underwent chemotherapy using Rituxan and Treanda completing cycle 4 on Mar 09, 2018.  CT scan results from July 07, 2019 reviewed independently revealing no evidence of recurrent or progressive disease.  No intervention is needed at this time.   Return to clinic in 6 months for repeat imaging and further evaluation.  If everything remains stable at that point, patient could possibly be transitioned to yearly imaging.  2. Decreased ejection fraction: Chronic and unchanged.  Previously patient had an ejection fraction of 40% with no clinical CHF.  Continue monitoring and evaluation per cardiology. 3. Diabetes: Patient has improved blood glucose control. Continue insulin pump as directed. 4. Thrombocytopenia: Chronic and unchanged.  Patient's platelet count is 102.  Monitor. 5.  Anemia: Hemoglobin improved to 12.6.  Monitor. 6.  Leukopenia: Resolved. 7.  Bradycardia: Patient has pacemaker in place. 8.  Renal insufficiency: Patient's creatinine appears to be at his baseline at 1.47.  Patient expressed understanding and was in agreement with this plan. He also understands that He can call clinic at any time with any questions, concerns, or complaints.    Lloyd Huger, MD   07/28/2019 6:42 AM

## 2019-07-25 ENCOUNTER — Ambulatory Visit (INDEPENDENT_AMBULATORY_CARE_PROVIDER_SITE_OTHER): Payer: Medicare Other | Admitting: Nurse Practitioner

## 2019-07-25 ENCOUNTER — Other Ambulatory Visit: Payer: Self-pay

## 2019-07-25 ENCOUNTER — Encounter (INDEPENDENT_AMBULATORY_CARE_PROVIDER_SITE_OTHER): Payer: Self-pay

## 2019-07-25 ENCOUNTER — Encounter: Payer: Self-pay | Admitting: Oncology

## 2019-07-25 VITALS — BP 160/79 | HR 103 | Resp 17 | Wt 267.0 lb

## 2019-07-25 DIAGNOSIS — I890 Lymphedema, not elsewhere classified: Secondary | ICD-10-CM

## 2019-07-25 NOTE — Progress Notes (Signed)
History of Present Illness  There is no documented history at this time  Assessments & Plan   There are no diagnoses linked to this encounter.    Additional instructions  Subjective:  Patient presents with venous ulcer of the Bilateral lower extremity.    Procedure:  3 layer unna wrap was placed Bilateral lower extremity.   Plan:   Follow up in one week.  

## 2019-07-25 NOTE — Progress Notes (Signed)
Patient pre screened for office appointment, no questions or concerns today. 

## 2019-07-26 ENCOUNTER — Inpatient Hospital Stay: Payer: Medicare Other | Attending: Oncology | Admitting: Oncology

## 2019-07-26 ENCOUNTER — Other Ambulatory Visit: Payer: Self-pay

## 2019-07-26 VITALS — BP 129/63 | HR 97 | Temp 96.4°F | Ht 70.0 in | Wt 267.0 lb

## 2019-07-26 DIAGNOSIS — Z803 Family history of malignant neoplasm of breast: Secondary | ICD-10-CM | POA: Diagnosis not present

## 2019-07-26 DIAGNOSIS — Z79899 Other long term (current) drug therapy: Secondary | ICD-10-CM | POA: Diagnosis not present

## 2019-07-26 DIAGNOSIS — E11311 Type 2 diabetes mellitus with unspecified diabetic retinopathy with macular edema: Secondary | ICD-10-CM | POA: Insufficient documentation

## 2019-07-26 DIAGNOSIS — D696 Thrombocytopenia, unspecified: Secondary | ICD-10-CM | POA: Insufficient documentation

## 2019-07-26 DIAGNOSIS — C8318 Mantle cell lymphoma, lymph nodes of multiple sites: Secondary | ICD-10-CM | POA: Insufficient documentation

## 2019-07-26 DIAGNOSIS — Z87891 Personal history of nicotine dependence: Secondary | ICD-10-CM | POA: Diagnosis not present

## 2019-07-26 DIAGNOSIS — I10 Essential (primary) hypertension: Secondary | ICD-10-CM | POA: Insufficient documentation

## 2019-07-26 DIAGNOSIS — E1143 Type 2 diabetes mellitus with diabetic autonomic (poly)neuropathy: Secondary | ICD-10-CM | POA: Diagnosis not present

## 2019-07-26 DIAGNOSIS — Z794 Long term (current) use of insulin: Secondary | ICD-10-CM | POA: Insufficient documentation

## 2019-07-26 DIAGNOSIS — Z9641 Presence of insulin pump (external) (internal): Secondary | ICD-10-CM | POA: Diagnosis not present

## 2019-07-26 DIAGNOSIS — D509 Iron deficiency anemia, unspecified: Secondary | ICD-10-CM | POA: Insufficient documentation

## 2019-07-26 DIAGNOSIS — I2510 Atherosclerotic heart disease of native coronary artery without angina pectoris: Secondary | ICD-10-CM | POA: Diagnosis not present

## 2019-07-26 DIAGNOSIS — Z950 Presence of cardiac pacemaker: Secondary | ICD-10-CM | POA: Diagnosis not present

## 2019-07-26 DIAGNOSIS — N289 Disorder of kidney and ureter, unspecified: Secondary | ICD-10-CM | POA: Diagnosis not present

## 2019-07-26 DIAGNOSIS — Z905 Acquired absence of kidney: Secondary | ICD-10-CM | POA: Diagnosis not present

## 2019-07-26 DIAGNOSIS — E7800 Pure hypercholesterolemia, unspecified: Secondary | ICD-10-CM | POA: Insufficient documentation

## 2019-07-26 NOTE — Progress Notes (Signed)
Patient is here today to go over his CT Scan results. 

## 2019-07-28 ENCOUNTER — Ambulatory Visit (INDEPENDENT_AMBULATORY_CARE_PROVIDER_SITE_OTHER): Payer: Medicare Other | Admitting: Nurse Practitioner

## 2019-07-28 ENCOUNTER — Other Ambulatory Visit: Payer: Self-pay

## 2019-07-28 VITALS — BP 146/76 | HR 99 | Resp 14 | Ht 70.0 in | Wt 269.0 lb

## 2019-07-28 DIAGNOSIS — M7062 Trochanteric bursitis, left hip: Secondary | ICD-10-CM | POA: Diagnosis not present

## 2019-07-28 DIAGNOSIS — I890 Lymphedema, not elsewhere classified: Secondary | ICD-10-CM | POA: Diagnosis not present

## 2019-07-28 DIAGNOSIS — M1612 Unilateral primary osteoarthritis, left hip: Secondary | ICD-10-CM | POA: Diagnosis not present

## 2019-07-28 NOTE — Progress Notes (Signed)
..  History of Present Illness  There is no documented history at this time  Assessments & Plan   There are no diagnoses linked to this encounter.    Additional instructions  Subjective:  Patient presents with venous ulcer of the Bilateral lower extremity.    Procedure:  3 layer unna wrap was placed Bilateral lower extremity.   Plan:   Follow up in one week.  Neosporin applied to patient skin before wrap placed per patient request. AS, CMA

## 2019-07-30 ENCOUNTER — Encounter (INDEPENDENT_AMBULATORY_CARE_PROVIDER_SITE_OTHER): Payer: Self-pay | Admitting: Nurse Practitioner

## 2019-08-01 ENCOUNTER — Ambulatory Visit (INDEPENDENT_AMBULATORY_CARE_PROVIDER_SITE_OTHER): Payer: Medicare Other | Admitting: Vascular Surgery

## 2019-08-01 ENCOUNTER — Other Ambulatory Visit: Payer: Self-pay

## 2019-08-01 VITALS — BP 168/108 | HR 116 | Resp 16 | Wt 269.0 lb

## 2019-08-01 DIAGNOSIS — L97209 Non-pressure chronic ulcer of unspecified calf with unspecified severity: Secondary | ICD-10-CM | POA: Insufficient documentation

## 2019-08-01 DIAGNOSIS — I2510 Atherosclerotic heart disease of native coronary artery without angina pectoris: Secondary | ICD-10-CM | POA: Diagnosis not present

## 2019-08-01 DIAGNOSIS — L97201 Non-pressure chronic ulcer of unspecified calf limited to breakdown of skin: Secondary | ICD-10-CM

## 2019-08-01 DIAGNOSIS — M7989 Other specified soft tissue disorders: Secondary | ICD-10-CM | POA: Diagnosis not present

## 2019-08-01 NOTE — Progress Notes (Signed)
..  History of Present Illness  There is no documented history at this time  Assessments & Plan   There are no diagnoses linked to this encounter.    Additional instructions  Subjective:  Patient presents with venous ulcer of the Bilateral lower extremity.    Procedure:  3 layer unna wrap was placed Bilateral lower extremity.   Plan:   Follow up in one week.   Xeroform placed over ulcers around ankles per verbal order given by Arna Medici last nurse visit. Neosporin was not applied.

## 2019-08-03 ENCOUNTER — Encounter (INDEPENDENT_AMBULATORY_CARE_PROVIDER_SITE_OTHER): Payer: Medicare Other

## 2019-08-04 ENCOUNTER — Other Ambulatory Visit: Payer: Self-pay

## 2019-08-04 ENCOUNTER — Ambulatory Visit (INDEPENDENT_AMBULATORY_CARE_PROVIDER_SITE_OTHER): Payer: Medicare Other | Admitting: Nurse Practitioner

## 2019-08-04 ENCOUNTER — Encounter (INDEPENDENT_AMBULATORY_CARE_PROVIDER_SITE_OTHER): Payer: Self-pay

## 2019-08-04 VITALS — BP 145/70 | HR 97 | Resp 16 | Wt 266.0 lb

## 2019-08-04 DIAGNOSIS — L97211 Non-pressure chronic ulcer of right calf limited to breakdown of skin: Secondary | ICD-10-CM

## 2019-08-04 DIAGNOSIS — L97201 Non-pressure chronic ulcer of unspecified calf limited to breakdown of skin: Secondary | ICD-10-CM

## 2019-08-04 NOTE — Progress Notes (Signed)
History of Present Illness  There is no documented history at this time  Assessments & Plan   There are no diagnoses linked to this encounter.    Additional instructions  Subjective:  Patient presents with venous ulcer of the Bilateral lower extremity.    Procedure:  3 layer unna wrap was placed Bilateral lower extremity.   Plan:   Follow up in one week.  

## 2019-08-07 ENCOUNTER — Encounter (INDEPENDENT_AMBULATORY_CARE_PROVIDER_SITE_OTHER): Payer: Self-pay | Admitting: Nurse Practitioner

## 2019-08-08 ENCOUNTER — Ambulatory Visit (INDEPENDENT_AMBULATORY_CARE_PROVIDER_SITE_OTHER): Payer: Medicare Other | Admitting: Nurse Practitioner

## 2019-08-08 ENCOUNTER — Telehealth (INDEPENDENT_AMBULATORY_CARE_PROVIDER_SITE_OTHER): Payer: Self-pay

## 2019-08-08 ENCOUNTER — Encounter (INDEPENDENT_AMBULATORY_CARE_PROVIDER_SITE_OTHER): Payer: Self-pay | Admitting: Nurse Practitioner

## 2019-08-08 ENCOUNTER — Other Ambulatory Visit: Payer: Self-pay

## 2019-08-08 VITALS — BP 151/74 | HR 91 | Resp 16 | Wt 270.0 lb

## 2019-08-08 DIAGNOSIS — K219 Gastro-esophageal reflux disease without esophagitis: Secondary | ICD-10-CM | POA: Diagnosis not present

## 2019-08-08 DIAGNOSIS — L97201 Non-pressure chronic ulcer of unspecified calf limited to breakdown of skin: Secondary | ICD-10-CM | POA: Diagnosis not present

## 2019-08-08 DIAGNOSIS — I890 Lymphedema, not elsewhere classified: Secondary | ICD-10-CM | POA: Diagnosis not present

## 2019-08-08 DIAGNOSIS — I10 Essential (primary) hypertension: Secondary | ICD-10-CM

## 2019-08-11 ENCOUNTER — Other Ambulatory Visit: Payer: Self-pay

## 2019-08-11 ENCOUNTER — Ambulatory Visit (INDEPENDENT_AMBULATORY_CARE_PROVIDER_SITE_OTHER): Payer: Medicare Other | Admitting: Nurse Practitioner

## 2019-08-11 ENCOUNTER — Encounter (INDEPENDENT_AMBULATORY_CARE_PROVIDER_SITE_OTHER): Payer: Self-pay

## 2019-08-11 VITALS — BP 122/79 | HR 109 | Resp 21 | Ht 70.0 in | Wt 266.0 lb

## 2019-08-11 DIAGNOSIS — L97201 Non-pressure chronic ulcer of unspecified calf limited to breakdown of skin: Secondary | ICD-10-CM

## 2019-08-11 NOTE — Progress Notes (Signed)
History of Present Illness  There is no documented history at this time  Assessments & Plan   There are no diagnoses linked to this encounter.    Additional instructions  Subjective:  Patient presents with venous ulcer of the Bilateral lower extremity.    Procedure:  3 layer unna wrap was placed Bilateral lower extremity.   Plan:   Follow up in one week.  

## 2019-08-14 ENCOUNTER — Encounter (INDEPENDENT_AMBULATORY_CARE_PROVIDER_SITE_OTHER): Payer: Self-pay | Admitting: Nurse Practitioner

## 2019-08-14 NOTE — Progress Notes (Signed)
SUBJECTIVE:  [name], male    DOB: September 02, 1946, 73 y.o.   MRN: [id number] Chief Complaint  Patient presents with  . Follow-up    unna check    HPI  Gary [name] is a 73 y.o. male that presents today for evaluation of his bilateral lower extremity edema with ulcerations.  Previously we had moved the patient to weekly Unna wrap changes to bike weekly Unna wrap changes due to the fact that the patient was soaking through his Unna wraps which was exacerbating his lower extremity wounds.  Since that time the swelling is much more under control and the wounds appear much better.  The reintroduction of the patient's furosemide also likely helped with the swelling as well.  Previously, the patient preferred to have Neosporin placed on his legs before Unna wraps were done.  At the last interim check I discussed transitioning from Neosporin to utilizing Xeroform as a way to help with the pain that the patient has especially near his ankle areas.  The patient states that this has felt much better and he is agreeable to continue doing this.  Since this change the patient's legs appear less red and irritated as well.  Overall the patient's lower extremities appear to be improving.  Past Medical History:  Diagnosis Date  . Depression   . 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 Gastroparesis   . Graves disease   . Hypercholesterolemia   . Hypertension   . Mantle cell lymphoma ([place]) 07/2014  . Peripheral neuropathy     Past Surgical History:  Procedure Laterality Date  . CHOLECYSTECTOMY    . NEPHRECTOMY     right  . PACEMAKER INSERTION N/A 12/07/2017   Procedure: INSERTION PACEMAKER DUAL CHAMBER INITIAL INSERT;  Surgeon: Isaias Cowman, MD;  Location: [facility];  Service: Cardiovascular;  Laterality: N/A;  . PORTA CATH INSERTION N/A 11/03/2017   Procedure: PORTA CATH INSERTION;  Surgeon: Katha Cabal, MD;  Location:  [place] [facility];  Service: Cardiovascular;  Laterality: N/A;    Social History   Socioeconomic History  . Marital status: Married    Spouse name: Not on file  . Number of children: 1  . Years of education: Not on file  . Highest education level: Not on file  Occupational History  . Not on file  Social Needs  . Financial resource strain: Not on file  . Food insecurity    Worry: Not on file    Inability: Not on file  . Transportation needs    Medical: Not on file    Non-medical: Not on file  Tobacco Use  . Smoking status: Former Research scientist (life sciences)  . Smokeless tobacco: Current User    Types: Chew  Substance and Sexual Activity  . Alcohol use: Yes    Alcohol/week: 3.0 standard drinks    Types: 3 Cans of beer per week    Comment: nightly  . Drug use: Yes    Types: Codeine  . Sexual activity: Not Currently  Lifestyle  . Physical activity    Days per week: Not on file    Minutes per session: Not on file  . Stress: Not on file  Relationships  . Social Herbalist on phone: Not on file    Gets together: Not on file    Attends religious service: Not on file    Active member of club or organization: Not on  file    Attends meetings of clubs or organizations: Not on file    Relationship status: Not on file  . Intimate partner violence    Fear of current or ex partner: Not on file    Emotionally abused: Not on file    Physically abused: Not on file    Forced sexual activity: Not on file  Other Topics Concern  . Not on file  Social History Narrative  . Not on file    Family History  Problem Relation Age of Onset  . Breast cancer Mother   . Diabetes Father   . Cancer Father        Lymphoma  . Alcohol abuse Maternal Uncle   . Diabetes Sister   . Heart disease Other        grandfather    Allergies  Allergen Reactions  . Rofecoxib Nausea Only     Review of Systems   Review of Systems: Negative Unless Checked Constitutional: [] Weight loss  [] Fever   [] Chills Cardiac: [] Chest pain   []  Atrial Fibrillation  [] Palpitations   [] Shortness of breath when laying flat   [] Shortness of breath with exertion. [] Shortness of breath at rest Vascular:  [] Pain in legs with walking   [] Pain in legs with standing [] Pain in legs when laying flat   [] Claudication    [] Pain in feet when laying flat    [] History of DVT   [] Phlebitis   [x] Swelling in legs   [] Varicose veins   [x] Non-healing ulcers Pulmonary:   [] Uses home oxygen   [] Productive cough   [] Hemoptysis   [] Wheeze  [] COPD   [] Asthma Neurologic:  [] Dizziness   [] Seizures  [] Blackouts [] History of stroke   [] History of TIA  [] Aphasia   [] Temporary Blindness   [] Weakness or numbness in arm   [] Weakness or numbness in leg Musculoskeletal:   [] Joint swelling   [] Joint pain   [] Low back pain  []  History of Knee Replacement [] Arthritis [] back Surgeries  []  Spinal Stenosis    Hematologic:  [] Easy bruising  [] Easy bleeding   [] Hypercoagulable state   [] Anemic Gastrointestinal:  [] Diarrhea   [] Vomiting  [x] Gastroesophageal reflux/heartburn   [] Difficulty swallowing. [] Abdominal pain Genitourinary:  [] Chronic kidney disease   [] Difficult urination  [] Anuric   [] Blood in urine [] Frequent urination  [] Burning with urination   [] Hematuria Skin:  [x] Rashes   [] Ulcers [] Wounds Psychological:  [] History of anxiety   []  History of major depression  []  Memory Difficulties      OBJECTIVE:   Physical Exam  BP (!) 151/74 (BP Location: Right Arm)   Pulse 91   Resp 16   Wt 270 lb (122.5 kg)   BMI 38.74 kg/m   Gen: WD/WN, NAD Head: [place]/AT, No temporalis wasting.  Ear/Nose/Throat: Hearing grossly intact, nares w/o erythema or drainage Eyes: PER, EOMI, sclera nonicteric.  Neck: Supple, no masses.  No JVD.  Pulmonary:  Good air movement, no use of accessory muscles.  Cardiac: RRR Vascular:  2+ edema bilaterally, stasis dermatitis, bilateral ulcerations are improving Vessel Right Left  Radial Palpable Palpable   Brachial Palpable Palpable  Femoral Palpable Palpable  Popliteal Palpable Palpable  Dorsalis Pedis Palpable Palpable  Posterior Tibial Palpable Palpable   Gastrointestinal: soft, non-distended. No guarding/no peritoneal signs.  Musculoskeletal: M/S 5/5 throughout.  No deformity or atrophy.  Neurologic: Pain and light touch intact in extremities.  Symmetrical.  Speech is fluent. Motor exam as listed above. Psychiatric: Judgment intact, Mood & affect appropriate for pt's clinical situation. Dermatologic:  No Venous rashes. No Ulcers Noted.  No changes consistent with cellulitis. Lymph : No Cervical lymphadenopathy, no lichenification or skin changes of chronic lymphedema.       ASSESSMENT AND PLAN:  1. Lymphedema The patient will continue in bilateral Unna wraps to help with resolution of his ulceration.  Patient will continue to elevate his lower extremities as much as possible and to exercise as much as he can tolerate.  2. Skin ulcer of calf, limited to breakdown of skin, unspecified laterality ([place]) It has gotten progressively better with the addition of Xeroform under the Unna wraps as well as the reimplementation of his diuretic.  We will continue to have the patient in [place] wraps which will be changed on a biweekly basis due to the extensiveness of weeping that the patient had present.  We will reevaluate his lower extremities in 4 weeks.  3. Gastroesophageal reflux disease, unspecified whether esophagitis present Continue PPI as already ordered, this medication has been reviewed and there are no changes at this time.  Avoidence of caffeine and alcohol  Moderate elevation of the head of the bed   4. Essential hypertension, benign Continue antihypertensive medications as already ordered, these medications have been reviewed and there are no changes at this time.    Current Outpatient Medications on File Prior to Visit  Medication Sig Dispense Refill  . ACCU-CHEK AVIVA PLUS  test strip USE AS INSTRUCTED. USE 1 STRIP EVERY 3 HOURS . ACCU-CHECK AVIVA PLUS TEST STRIP. DX E10.649  12  . acetaminophen (TYLENOL) 500 MG tablet Take by mouth.    . cyanocobalamin (,VITAMIN B-12,) 1000 MCG/ML injection Inject into the muscle.    . ferrous sulfate 325 (65 FE) MG tablet Take 325 mg by mouth daily.    Marland Kitchen GLUCAGON EMERGENCY 1 MG injection     . glucagon, human recombinant, (GLUCAGEN DIAGNOSTIC) 1 MG injection Use as directed    . glucose 4 GM chewable tablet Chew 1 tablet by mouth once as needed for low blood sugar.    Marland Kitchen glucose blood (PRECISION QID TEST) test strip Use 8 (eight) times daily as directed. BAYER CONTOUR NEXT TEST STRIPS E10.649    . insulin aspart (NOVOLOG) 100 UNIT/ML injection Basal rate 12am 0.8 units per hour, 9am 0.9 units per hour. (total basal insulin 20.7 units). Carbohydrate ratio 1 units for every 8gm of carbohydrate. Correction factor 1 units for every 50mg /dl over target cbg. Target CBG 80-120 1 vial 12  . levothyroxine (SYNTHROID) 25 MCG tablet Take by mouth.    Marland Kitchen lisinopril (PRINIVIL,ZESTRIL) 20 MG tablet Take 20 mg by mouth daily.    . Multiple Vitamin (MULTIVITAMIN) tablet Take 1 tablet by mouth daily.    . pravastatin (PRAVACHOL) 40 MG tablet Take by mouth.    . prochlorperazine (COMPAZINE) 10 MG tablet TAKE 1 TABLET (10 MG TOTAL) BY MOUTH EVERY 6 (SIX) HOURS AS NEEDED (NAUSEA OR VOMITING).    Marland Kitchen spironolactone (ALDACTONE) 25 MG tablet Take 25 mg by mouth daily.    Marland Kitchen amoxicillin-clavulanate (AUGMENTIN) 875-125 MG tablet Take 1 tablet by mouth 2 (two) times daily. 20 tablet 0  . carvedilol (COREG) 3.125 MG tablet Take 3.125 mg by mouth 2 (two) times daily with a meal.      Current Facility-Administered Medications on File Prior to Visit  Medication Dose Route Frequency Provider Last Rate Last Dose  . heparin lock flush 100 unit/mL  500 Units Intravenous Once Lloyd Huger, MD      .  sodium chloride flush (NS) 0.9 % injection 10 mL  10 mL  Intravenous PRN Lloyd Huger, MD   10 mL at 02/01/18 0829  . sodium chloride flush (NS) 0.9 % injection 10 mL  10 mL Intravenous PRN Lloyd Huger, MD   10 mL at 04/05/18 0800    There are no Patient Instructions on file for this visit. No follow-ups on file.   Kris Hartmann, NP  This note was completed with Sales executive.  Any errors are purely unintentional.

## 2019-08-15 ENCOUNTER — Ambulatory Visit (INDEPENDENT_AMBULATORY_CARE_PROVIDER_SITE_OTHER): Payer: Medicare Other | Admitting: Nurse Practitioner

## 2019-08-15 ENCOUNTER — Encounter (INDEPENDENT_AMBULATORY_CARE_PROVIDER_SITE_OTHER): Payer: Self-pay | Admitting: Nurse Practitioner

## 2019-08-15 ENCOUNTER — Other Ambulatory Visit: Payer: Self-pay

## 2019-08-15 VITALS — BP 126/78 | HR 102 | Resp 18 | Ht 70.0 in | Wt 275.0 lb

## 2019-08-15 DIAGNOSIS — L97201 Non-pressure chronic ulcer of unspecified calf limited to breakdown of skin: Secondary | ICD-10-CM | POA: Diagnosis not present

## 2019-08-15 NOTE — Progress Notes (Signed)
History of Present Illness  There is no documented history at this time  Assessments & Plan   There are no diagnoses linked to this encounter.    Additional instructions  Subjective:  Patient presents with venous ulcer of the Bilateral lower extremity.    Procedure:  3 layer unna wrap was placed Bilateral lower extremity.   Plan:   Follow up in one week.  

## 2019-08-18 ENCOUNTER — Emergency Department: Payer: Medicare Other

## 2019-08-18 ENCOUNTER — Inpatient Hospital Stay
Admission: EM | Admit: 2019-08-18 | Discharge: 2019-08-21 | DRG: 602 | Disposition: A | Discharge status: Home / Self Care | Payer: Medicare Other | Attending: Internal Medicine | Admitting: Internal Medicine

## 2019-08-18 ENCOUNTER — Inpatient Hospital Stay: Payer: Medicare Other

## 2019-08-18 ENCOUNTER — Other Ambulatory Visit: Payer: Self-pay

## 2019-08-18 ENCOUNTER — Encounter (INDEPENDENT_AMBULATORY_CARE_PROVIDER_SITE_OTHER): Payer: Self-pay

## 2019-08-18 ENCOUNTER — Encounter: Payer: Self-pay | Admitting: Emergency Medicine

## 2019-08-18 ENCOUNTER — Ambulatory Visit (INDEPENDENT_AMBULATORY_CARE_PROVIDER_SITE_OTHER): Payer: Medicare Other | Admitting: Nurse Practitioner

## 2019-08-18 VITALS — BP 124/61 | HR 66 | Resp 16 | Wt 276.0 lb

## 2019-08-18 DIAGNOSIS — L03116 Cellulitis of left lower limb: Secondary | ICD-10-CM | POA: Diagnosis present

## 2019-08-18 DIAGNOSIS — Z9641 Presence of insulin pump (external) (internal): Secondary | ICD-10-CM | POA: Diagnosis present

## 2019-08-18 DIAGNOSIS — Z807 Family history of other malignant neoplasms of lymphoid, hematopoietic and related tissues: Secondary | ICD-10-CM

## 2019-08-18 DIAGNOSIS — Z794 Long term (current) use of insulin: Secondary | ICD-10-CM | POA: Diagnosis not present

## 2019-08-18 DIAGNOSIS — E10311 Type 1 diabetes mellitus with unspecified diabetic retinopathy with macular edema: Secondary | ICD-10-CM | POA: Diagnosis present

## 2019-08-18 DIAGNOSIS — I5022 Chronic systolic (congestive) heart failure: Secondary | ICD-10-CM | POA: Diagnosis present

## 2019-08-18 DIAGNOSIS — Z888 Allergy status to other drugs, medicaments and biological substances status: Secondary | ICD-10-CM | POA: Diagnosis not present

## 2019-08-18 DIAGNOSIS — E1151 Type 2 diabetes mellitus with diabetic peripheral angiopathy without gangrene: Secondary | ICD-10-CM | POA: Diagnosis not present

## 2019-08-18 DIAGNOSIS — S81802A Unspecified open wound, left lower leg, initial encounter: Secondary | ICD-10-CM | POA: Diagnosis present

## 2019-08-18 DIAGNOSIS — R600 Localized edema: Secondary | ICD-10-CM | POA: Diagnosis not present

## 2019-08-18 DIAGNOSIS — E1043 Type 1 diabetes mellitus with diabetic autonomic (poly)neuropathy: Secondary | ICD-10-CM | POA: Diagnosis present

## 2019-08-18 DIAGNOSIS — Z905 Acquired absence of kidney: Secondary | ICD-10-CM | POA: Diagnosis not present

## 2019-08-18 DIAGNOSIS — E1069 Type 1 diabetes mellitus with other specified complication: Secondary | ICD-10-CM

## 2019-08-18 DIAGNOSIS — L97201 Non-pressure chronic ulcer of unspecified calf limited to breakdown of skin: Secondary | ICD-10-CM

## 2019-08-18 DIAGNOSIS — M79605 Pain in left leg: Secondary | ICD-10-CM

## 2019-08-18 DIAGNOSIS — L03119 Cellulitis of unspecified part of limb: Secondary | ICD-10-CM

## 2019-08-18 DIAGNOSIS — I509 Heart failure, unspecified: Secondary | ICD-10-CM

## 2019-08-18 DIAGNOSIS — F329 Major depressive disorder, single episode, unspecified: Secondary | ICD-10-CM | POA: Diagnosis present

## 2019-08-18 DIAGNOSIS — E785 Hyperlipidemia, unspecified: Secondary | ICD-10-CM

## 2019-08-18 DIAGNOSIS — E1022 Type 1 diabetes mellitus with diabetic chronic kidney disease: Secondary | ICD-10-CM | POA: Diagnosis present

## 2019-08-18 DIAGNOSIS — Z20828 Contact with and (suspected) exposure to other viral communicable diseases: Secondary | ICD-10-CM | POA: Diagnosis present

## 2019-08-18 DIAGNOSIS — I6523 Occlusion and stenosis of bilateral carotid arteries: Secondary | ICD-10-CM | POA: Diagnosis present

## 2019-08-18 DIAGNOSIS — I5023 Acute on chronic systolic (congestive) heart failure: Secondary | ICD-10-CM | POA: Diagnosis not present

## 2019-08-18 DIAGNOSIS — Z9221 Personal history of antineoplastic chemotherapy: Secondary | ICD-10-CM

## 2019-08-18 DIAGNOSIS — R609 Edema, unspecified: Secondary | ICD-10-CM

## 2019-08-18 DIAGNOSIS — D649 Anemia, unspecified: Secondary | ICD-10-CM | POA: Diagnosis present

## 2019-08-18 DIAGNOSIS — E10622 Type 1 diabetes mellitus with other skin ulcer: Secondary | ICD-10-CM | POA: Diagnosis present

## 2019-08-18 DIAGNOSIS — R0602 Shortness of breath: Secondary | ICD-10-CM | POA: Diagnosis present

## 2019-08-18 DIAGNOSIS — L0390 Cellulitis, unspecified: Secondary | ICD-10-CM | POA: Diagnosis present

## 2019-08-18 DIAGNOSIS — N1832 Chronic kidney disease, stage 3b: Secondary | ICD-10-CM

## 2019-08-18 DIAGNOSIS — N183 Chronic kidney disease, stage 3 unspecified: Secondary | ICD-10-CM | POA: Diagnosis present

## 2019-08-18 DIAGNOSIS — E0500 Thyrotoxicosis with diffuse goiter without thyrotoxic crisis or storm: Secondary | ICD-10-CM | POA: Diagnosis present

## 2019-08-18 DIAGNOSIS — I2510 Atherosclerotic heart disease of native coronary artery without angina pectoris: Secondary | ICD-10-CM | POA: Diagnosis present

## 2019-08-18 DIAGNOSIS — E1042 Type 1 diabetes mellitus with diabetic polyneuropathy: Secondary | ICD-10-CM | POA: Diagnosis present

## 2019-08-18 DIAGNOSIS — C8310 Mantle cell lymphoma, unspecified site: Secondary | ICD-10-CM | POA: Diagnosis present

## 2019-08-18 DIAGNOSIS — I130 Hypertensive heart and chronic kidney disease with heart failure and stage 1 through stage 4 chronic kidney disease, or unspecified chronic kidney disease: Secondary | ICD-10-CM | POA: Diagnosis present

## 2019-08-18 DIAGNOSIS — E079 Disorder of thyroid, unspecified: Secondary | ICD-10-CM | POA: Diagnosis present

## 2019-08-18 DIAGNOSIS — S81801A Unspecified open wound, right lower leg, initial encounter: Secondary | ICD-10-CM | POA: Diagnosis present

## 2019-08-18 DIAGNOSIS — Z79890 Hormone replacement therapy (postmenopausal): Secondary | ICD-10-CM

## 2019-08-18 DIAGNOSIS — K3184 Gastroparesis: Secondary | ICD-10-CM | POA: Diagnosis present

## 2019-08-18 DIAGNOSIS — Z803 Family history of malignant neoplasm of breast: Secondary | ICD-10-CM

## 2019-08-18 DIAGNOSIS — Z87891 Personal history of nicotine dependence: Secondary | ICD-10-CM

## 2019-08-18 DIAGNOSIS — Z833 Family history of diabetes mellitus: Secondary | ICD-10-CM

## 2019-08-18 DIAGNOSIS — L03115 Cellulitis of right lower limb: Secondary | ICD-10-CM | POA: Diagnosis present

## 2019-08-18 DIAGNOSIS — M79604 Pain in right leg: Secondary | ICD-10-CM

## 2019-08-18 DIAGNOSIS — Z6841 Body Mass Index (BMI) 40.0 and over, adult: Secondary | ICD-10-CM | POA: Diagnosis not present

## 2019-08-18 DIAGNOSIS — N1831 Chronic kidney disease, stage 3a: Secondary | ICD-10-CM | POA: Diagnosis not present

## 2019-08-18 DIAGNOSIS — L97509 Non-pressure chronic ulcer of other part of unspecified foot with unspecified severity: Secondary | ICD-10-CM | POA: Diagnosis not present

## 2019-08-18 DIAGNOSIS — E11621 Type 2 diabetes mellitus with foot ulcer: Secondary | ICD-10-CM | POA: Diagnosis not present

## 2019-08-18 DIAGNOSIS — Z9119 Patient's noncompliance with other medical treatment and regimen: Secondary | ICD-10-CM

## 2019-08-18 DIAGNOSIS — E10621 Type 1 diabetes mellitus with foot ulcer: Secondary | ICD-10-CM | POA: Diagnosis not present

## 2019-08-18 DIAGNOSIS — Z950 Presence of cardiac pacemaker: Secondary | ICD-10-CM | POA: Diagnosis not present

## 2019-08-18 DIAGNOSIS — E1021 Type 1 diabetes mellitus with diabetic nephropathy: Secondary | ICD-10-CM | POA: Diagnosis present

## 2019-08-18 DIAGNOSIS — Z8249 Family history of ischemic heart disease and other diseases of the circulatory system: Secondary | ICD-10-CM

## 2019-08-18 DIAGNOSIS — I890 Lymphedema, not elsewhere classified: Secondary | ICD-10-CM | POA: Diagnosis not present

## 2019-08-18 DIAGNOSIS — E119 Type 2 diabetes mellitus without complications: Secondary | ICD-10-CM

## 2019-08-18 LAB — CBC WITH DIFFERENTIAL/PLATELET
Abs Immature Granulocytes: 0.08 10*3/uL — ABNORMAL HIGH (ref 0.00–0.07)
Basophils Absolute: 0 10*3/uL (ref 0.0–0.1)
Basophils Relative: 0 %
Eosinophils Absolute: 0 10*3/uL (ref 0.0–0.5)
Eosinophils Relative: 0 %
HCT: 40.7 % (ref 39.0–52.0)
Hemoglobin: 13.3 g/dL (ref 13.0–17.0)
Immature Granulocytes: 1 %
Lymphocytes Relative: 5 %
Lymphs Abs: 0.6 10*3/uL — ABNORMAL LOW (ref 0.7–4.0)
MCH: 31.5 pg (ref 26.0–34.0)
MCHC: 32.7 g/dL (ref 30.0–36.0)
MCV: 96.4 fL (ref 80.0–100.0)
Monocytes Absolute: 0.5 10*3/uL (ref 0.1–1.0)
Monocytes Relative: 4 %
Neutro Abs: 10.1 10*3/uL — ABNORMAL HIGH (ref 1.7–7.7)
Neutrophils Relative %: 90 %
Platelets: 121 10*3/uL — ABNORMAL LOW (ref 150–400)
RBC: 4.22 MIL/uL (ref 4.22–5.81)
RDW: 13.9 % (ref 11.5–15.5)
WBC: 11.3 10*3/uL — ABNORMAL HIGH (ref 4.0–10.5)
nRBC: 0 % (ref 0.0–0.2)

## 2019-08-18 LAB — COMPREHENSIVE METABOLIC PANEL
ALT: 39 U/L (ref 0–44)
AST: 37 U/L (ref 15–41)
Albumin: 3.2 g/dL — ABNORMAL LOW (ref 3.5–5.0)
Alkaline Phosphatase: 127 U/L — ABNORMAL HIGH (ref 38–126)
Anion gap: 11 (ref 5–15)
BUN: 39 mg/dL — ABNORMAL HIGH (ref 8–23)
CO2: 19 mmol/L — ABNORMAL LOW (ref 22–32)
Calcium: 8.4 mg/dL — ABNORMAL LOW (ref 8.9–10.3)
Chloride: 105 mmol/L (ref 98–111)
Creatinine, Ser: 1.99 mg/dL — ABNORMAL HIGH (ref 0.61–1.24)
GFR calc Af Amer: 38 mL/min — ABNORMAL LOW (ref >60)
GFR calc non Af Amer: 33 mL/min — ABNORMAL LOW (ref >60)
Glucose, Bld: 142 mg/dL — ABNORMAL HIGH (ref 70–99)
Potassium: 4.5 mmol/L (ref 3.5–5.1)
Sodium: 135 mmol/L (ref 135–145)
Total Bilirubin: 0.9 mg/dL (ref 0.3–1.2)
Total Protein: 7 g/dL (ref 6.5–8.1)

## 2019-08-18 LAB — LACTIC ACID, PLASMA: Lactic Acid, Venous: 1.2 mmol/L (ref 0.5–1.9)

## 2019-08-18 LAB — GLUCOSE, CAPILLARY: Glucose-Capillary: 125 mg/dL — ABNORMAL HIGH (ref 70–99)

## 2019-08-18 MED ORDER — SODIUM CHLORIDE 0.9 % IV SOLN
2.0000 g | INTRAVENOUS | Status: DC
  Administered 2019-08-18 – 2019-08-20 (×3): 2 g via INTRAVENOUS
  Filled 2019-08-18 (×2): qty 20
  Filled 2019-08-18: qty 2
  Filled 2019-08-18: qty 20

## 2019-08-18 MED ORDER — ONDANSETRON HCL 4 MG/2ML IJ SOLN
4.0000 mg | Freq: Once | INTRAMUSCULAR | Status: AC
  Administered 2019-08-18: 4 mg via INTRAVENOUS
  Filled 2019-08-18: qty 2

## 2019-08-18 MED ORDER — SODIUM CHLORIDE 0.9% FLUSH: 3.0000 mL | Freq: Once | INTRAVENOUS | Status: DC

## 2019-08-18 MED ORDER — SODIUM CHLORIDE 0.9 % IV BOLUS
1000.0000 mL | Freq: Once | INTRAVENOUS | Status: AC
  Administered 2019-08-18: 1000 mL via INTRAVENOUS

## 2019-08-18 MED ORDER — ENOXAPARIN SODIUM 40 MG/0.4ML ~~LOC~~ SOLN
40.0000 mg | SUBCUTANEOUS | Status: DC
  Administered 2019-08-18 – 2019-08-20 (×3): 40 mg via SUBCUTANEOUS
  Filled 2019-08-18 (×4): qty 0.4

## 2019-08-18 MED ORDER — SODIUM CHLORIDE 0.9 % IV SOLN
3.0000 g | Freq: Once | INTRAVENOUS | Status: AC
  Administered 2019-08-18: 3 g via INTRAVENOUS
  Filled 2019-08-18: qty 8

## 2019-08-18 MED ORDER — FUROSEMIDE 10 MG/ML IJ SOLN
40.0000 mg | Freq: Every day | INTRAMUSCULAR | Status: DC
  Administered 2019-08-18 – 2019-08-19 (×2): 40 mg via INTRAVENOUS
  Filled 2019-08-18 (×2): qty 4

## 2019-08-18 MED ORDER — VANCOMYCIN HCL 1.5 G IV SOLR
1500.0000 mg | Freq: Once | INTRAVENOUS | Status: AC
  Administered 2019-08-18: 1500 mg via INTRAVENOUS
  Filled 2019-08-18: qty 1500

## 2019-08-18 MED ORDER — MORPHINE SULFATE (PF) 2 MG/ML IV SOLN
2.0000 mg | Freq: Once | INTRAVENOUS | Status: AC
  Administered 2019-08-18: 2 mg via INTRAVENOUS
  Filled 2019-08-18: qty 1

## 2019-08-18 MED ORDER — VANCOMYCIN HCL IN DEXTROSE 1-5 GM/200ML-% IV SOLN
1000.0000 mg | Freq: Once | INTRAVENOUS | Status: AC
  Administered 2019-08-18: 1000 mg via INTRAVENOUS
  Filled 2019-08-18: qty 200

## 2019-08-18 NOTE — Progress Notes (Signed)
Pharmacy Antibiotic Note  Gary [name] is a 73 y.o. male admitted on 08/18/2019 with cellulitis.  Pharmacy has been consulted for Vancomycin dosing. Hx DM, CKD Patient received Unasyn x 1 in ER and has CTX   Plan: Patient to receive 1 gram IV Vancomycin in ED followed by Vanc 1500mg  x 1 for a total Loading dose of 2500mg  (based on pt wt 125 kg)  Will continue with Vancomycin 1 gram IV q24h. Goal AUC 400-550. Expected AUC: 475 SCr used: 1.99 Cmin 13    Height: 5\' 10"  (177.8 cm) Weight: 276 lb (125.2 kg) IBW/kg (Calculated) : 73  Temp (24hrs), Avg:99 F (37.2 C), Min:99 F (37.2 C), Max:99 F (37.2 C)  Recent Labs  Lab 08/18/19 1310 08/18/19 1431  WBC 11.3*  --   CREATININE 1.99*  --   LATICACIDVEN  --  1.2    Estimated Creatinine Clearance: 45.2 mL/min (A) (by C-G formula based on SCr of 1.99 mg/dL (H)).    Allergies  Allergen Reactions  . Rofecoxib Nausea Only    Antimicrobials this admission: Unasyn x 1 11/6  CTX  11/7 >>   Vanc 11/6 >>  Dose adjustments this admission:   Microbiology results: 11/6 BCx: pending   UCx:      Sputum:      MRSA PCR:    Thank you for allowing pharmacy to be a part of this patient's care.  Manaia Samad A 08/18/2019 8:14 PM

## 2019-08-18 NOTE — H&P (Signed)
History and Physical    Gary [name] ZOX:[id number] DOB: [birth year] DOA: 08/18/2019  PCP: [name], MD  Patient coming from: Vascular outpatient clinic  I have personally briefly reviewed patient's old medical records in [place]  Chief Complaint: Worsening bilateral lower extremity cellulitis  HPI: Gary [name] is a 73 y.o. male with medical history significant of Chronic systolic heart failure (EF of 55-65% in 11/2017), bilateral carotid artery stenosis, CAD, hypertension, Graves' disease, Type 1 diabetes with gastroparesis, CKD stage III, mantle cell lymphoma for worsening bilateral lower extremity cellulitis. Patient has been following with vascular surgery outpatient for bilateral Unna wrap and management of his lower extremity lymphedema.  Today at an outpatient visit it was deemed that his cellulitis had become more severe and recommended that he present to the ED for IV antibiotics.  Patient reports that he has gotten much worse this week and he is having more increased pain around his ankles.  Denies any fevers or chills.  No nausea, vomiting or diarrhea. He also notes ongoing dyspnea with exertion for the past year that has gotten worse this past month as well as some increased weight gain.  Patient has always had to sit up in a recliner to sleep but denies any paroxysmal nocturnal dyspnea. Denies any chest pain or palpitation.    ED Course: He had a temperature of 99 and was otherwise normotensive on room air.  Had WBC of 11.3.  Creatinine of 1.99 which is around his baseline.  Lactate of 1.2.  Review of Systems:  Constitutional: No Weight Change, No Fever ENT/Mouth: No sore throat, No Rhinorrhea Eyes: No Eye Pain, No Vision Changes Cardiovascular: No Chest Pain, + SOB, No PND, + Dyspnea on Exertion, + Orthopnea,, No Edema, No Palpitations Respiratory: No Cough, No Sputum, No Wheezing, + Dyspnea  Gastrointestinal: No Nausea, No Vomiting, No Diarrhea, No Constipation,  No Pain Genitourinary: no Urinary Incontinence, No Urgency, No Flank Pain Musculoskeletal: No Arthralgias, + Myalgias Skin: No Skin Lesions, No Pruritus, Neuro: no Weakness, No Numbness,  No Loss of Consciousness, No Syncope Psych: No Anxiety/Panic, No Depression, no decrease appetite Heme/Lymph: No Bruising, No Bleeding  Past Medical History:  Diagnosis Date  . Depression   . 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 Gastroparesis   . Graves disease   . Hypercholesterolemia   . Hypertension   . Mantle cell lymphoma ([place]) 07/2014  . Peripheral neuropathy     Past Surgical History:  Procedure Laterality Date  . CHOLECYSTECTOMY    . NEPHRECTOMY     right  . PACEMAKER INSERTION N/A 12/07/2017   Procedure: INSERTION PACEMAKER DUAL CHAMBER INITIAL INSERT;  Surgeon: Isaias Cowman, MD;  Location: [facility];  Service: Cardiovascular;  Laterality: N/A;  . PORTA CATH INSERTION N/A 11/03/2017   Procedure: PORTA CATH INSERTION;  Surgeon: Katha Cabal, MD;  Location: [place] [facility];  Service: Cardiovascular;  Laterality: N/A;     reports that he has quit smoking. His smokeless tobacco use includes chew. He reports current alcohol use of about 3.0 standard drinks of alcohol per week. He reports current drug use. Drug: Codeine.  Allergies  Allergen Reactions  . Rofecoxib Nausea Only    Family History  Problem Relation Age of Onset  . Breast cancer Mother   . Diabetes Father   . Cancer Father        Lymphoma  . Alcohol abuse Maternal Uncle   .  Diabetes Sister   . Heart disease Other        grandfather    Family history reviewed and not pertinent   Prior to Admission medications   Medication Sig Start Date End Date Taking? Authorizing Provider  ACCU-CHEK AVIVA PLUS test strip USE AS INSTRUCTED. USE 1 STRIP EVERY 3 HOURS . ACCU-CHECK AVIVA PLUS TEST STRIP. DX B93.903 01/06/17   [provider]   acetaminophen (TYLENOL) 500 MG tablet Take by mouth.    [provider]  amoxicillin-clavulanate (AUGMENTIN) 875-125 MG tablet Take 1 tablet by mouth 2 (two) times daily. Patient not taking: Reported on 08/18/2019 07/18/19   Kris Hartmann, NP  carvedilol (COREG) 3.125 MG tablet Take 3.125 mg by mouth 2 (two) times daily with a meal.  10/07/17 07/25/19  [provider]  cyanocobalamin (,VITAMIN B-12,) 1000 MCG/ML injection Inject into the muscle.    [provider]  ferrous sulfate 325 (65 FE) MG tablet Take 325 mg by mouth daily.    [provider]  furosemide (LASIX) 20 MG tablet Take 20 mg by mouth daily.    [provider]  GLUCAGON EMERGENCY 1 MG injection  11/30/17   [provider]  glucagon, human recombinant, (GLUCAGEN DIAGNOSTIC) 1 MG injection Use as directed 11/30/17   [provider]  glucose 4 GM chewable tablet Chew 1 tablet by mouth once as needed for low blood sugar.    [provider]  glucose blood (PRECISION QID TEST) test strip Use 8 (eight) times daily as directed. BAYER CONTOUR NEXT TEST STRIPS E10.649 03/03/18   [provider]  insulin aspart (NOVOLOG) 100 UNIT/ML injection Basal rate 12am 0.8 units per hour, 9am 0.9 units per hour. (total basal insulin 20.7 units). Carbohydrate ratio 1 units for every 8gm of carbohydrate. Correction factor 1 units for every 50mg /dl over target cbg. Target CBG 80-120 10/29/17   Loletha Grayer, MD  levothyroxine (SYNTHROID) 25 MCG tablet Take by mouth. 03/29/19   [provider]  lisinopril (PRINIVIL,ZESTRIL) 20 MG tablet Take 20 mg by mouth daily.    [provider]  Multiple Vitamin (MULTIVITAMIN) tablet Take 1 tablet by mouth daily.    [provider]  pravastatin (PRAVACHOL) 40 MG tablet Take by mouth. 03/29/19   [provider]  prochlorperazine (COMPAZINE) 10 MG tablet TAKE 1 TABLET (10 MG TOTAL) BY MOUTH EVERY 6 (SIX) HOURS  AS NEEDED (NAUSEA OR VOMITING). 10/24/17   [provider]  spironolactone (ALDACTONE) 25 MG tablet Take 25 mg by mouth daily.    [provider]    Physical Exam: Vitals:   08/18/19 1307 08/18/19 1309 08/18/19 1907  BP:  (!) 101/45 (!) 137/57  Pulse:  (!) 59 68  Resp:  16 18  Temp:  99 F (37.2 C)   TempSrc:  Oral   SpO2:  95% 100%  Weight: 125.2 kg    Height: 5\' 10"  (1.778 m)      Constitutional: ill appearing and diaphoretic male sitting up in bed dyspneic with obese abdomen constricting anterior chest Vitals:   08/18/19 1307 08/18/19 1309 08/18/19 1907  BP:  (!) 101/45 (!) 137/57  Pulse:  (!) 59 68  Resp:  16 18  Temp:  99 F (37.2 C)   TempSrc:  Oral   SpO2:  95% 100%  Weight: 125.2 kg    Height: 5\' 10"  (1.778 m)     Eyes: PERRL, lids and conjunctivae normal ENMT: Mucous membranes are moist.  Neck:  normal, supple, no masses Respiratory: clear to auscultation bilaterally, no wheezing, no crackles. Dyspnea at rest and worse when speaking. No accessory muscle use.  Cardiovascular: Regular rate and rhythm, no murmurs / rubs / gallops. +3 pitting edema up to bilateral thigh. Difficult to obtain dorsalis pedis pulse due to edema. Abdomen: obese abdomen, no tenderness, no masses palpated. No hepatosplenomegaly. Bowel sounds positive.  Musculoskeletal: no clubbing / cyanosis. Normal muscle tone. Left lower extremity appears larger circumferentially than the right.  Skin: +3 pitting edema up to bilateral thigh with weeping of fluids worse on left than right lower extremity with beefy red erythema of the pre-tibial area bilaterally.  Neurologic: CN 2-12 grossly intact. Sensation intact. Strength 4/5 in all lower extremity. Psychiatric: Normal judgment and insight. Alert and oriented x 3. Normal mood.    Labs on Admission: I have personally reviewed following labs and imaging studies  CBC: Recent Labs  Lab 08/18/19 1310  WBC 11.3*  NEUTROABS 10.1*  HGB  13.3  HCT 40.7  MCV 96.4  PLT 528*   Basic Metabolic Panel: Recent Labs  Lab 08/18/19 1310  NA 135  K 4.5  CL 105  CO2 19*  GLUCOSE 142*  BUN 39*  CREATININE 1.99*  CALCIUM 8.4*   GFR: Estimated Creatinine Clearance: 45.2 mL/min (A) (by C-G formula based on SCr of 1.99 mg/dL (H)). Liver Function Tests: Recent Labs  Lab 08/18/19 1310  AST 37  ALT 39  ALKPHOS 127*  BILITOT 0.9  PROT 7.0  ALBUMIN 3.2*   No results for input(s): LIPASE, AMYLASE in the last 168 hours. No results for input(s): AMMONIA in the last 168 hours. Coagulation Profile: No results for input(s): INR, PROTIME in the last 168 hours. Cardiac Enzymes: No results for input(s): CKTOTAL, CKMB, CKMBINDEX, TROPONINI in the last 168 hours. BNP (last 3 results) No results for input(s): PROBNP in the last 8760 hours. HbA1C: No results for input(s): HGBA1C in the last 72 hours. CBG: No results for input(s): GLUCAP in the last 168 hours. Lipid Profile: No results for input(s): CHOL, HDL, LDLCALC, TRIG, CHOLHDL, LDLDIRECT in the last 72 hours. Thyroid Function Tests: No results for input(s): TSH, T4TOTAL, FREET4, T3FREE, THYROIDAB in the last 72 hours. Anemia Panel: No results for input(s): VITAMINB12, FOLATE, FERRITIN, TIBC, IRON, RETICCTPCT in the last 72 hours. Urine analysis:    Component Value Date/Time   COLORURINE YELLOW (A) 07/14/2018 0322   APPEARANCEUR CLEAR (A) 07/14/2018 0322   APPEARANCEUR Cloudy 06/13/2014 0823   LABSPEC 1.013 07/14/2018 0322   LABSPEC 1.018 06/13/2014 0823   PHURINE 5.0 07/14/2018 0322   GLUCOSEU NEGATIVE 07/14/2018 0322   GLUCOSEU 50 mg/dL 06/13/2014 0823   HGBUR NEGATIVE 07/14/2018 0322   BILIRUBINUR NEGATIVE 07/14/2018 0322   BILIRUBINUR Negative 06/13/2014 0823   KETONESUR 5 (A) 07/14/2018 0322   PROTEINUR 30 (A) 07/14/2018 0322   NITRITE NEGATIVE 07/14/2018 0322   LEUKOCYTESUR NEGATIVE 07/14/2018 0322   LEUKOCYTESUR 2+ 06/13/2014 0823    Radiological Exams  on Admission: No results found.   Assessment/Plan  Bilateral lower extremity cellulitis - Left LE appears more edematous than right- will obtain Left venous DVT ultrasound  - IV Vancomycin and Rocephin  - wound care per RN  Dyspnea - suspect worsening systolic heart failure and body habitus from morbid obesity  - last echo in 11/2017 with EF of 55-65% - obtain Echo  - daily IV 40mg  Lasix - check Intake and output - daily weights - continue spironolactone and Coreg  Graves disease - continue synthroid  Type 1 Diabetes  - continue insulin pump settings per endo and check ACHS and mealtime - Last HbA1C of 6.3  Chronic kidney disease stage 3 - stable creatinine - avoid nephrotoxic agent   Mantle cell lymphoma s/p chemo -stable  Hyperlipidemia - continue pravastatin  DVT prophylaxis:.Lovenox Code Status:Full Family Communication: Plan discussed with patient at bedside  disposition Plan: Home with at least 2 midnight stays  Consults called:  Admission status: inpatient   Resean Brander T Tandra Rosado DO [facility]   If 7PM-7AM, please contact night-coverage [email or web address] Password [facility]  08/18/2019, 7:54 PM

## 2019-08-18 NOTE — ED Notes (Signed)
Report given to Armanda Heritage RN

## 2019-08-18 NOTE — ED Provider Notes (Signed)
[facility] Emergency Department Provider Note   ____________________________________________   First MD Initiated Contact with Patient 08/18/19 1735     (approximate)  I have reviewed the triage vital signs and the nursing notes.   HISTORY  Chief Complaint Cellulitis and Wound Infection    HPI Gary [name] is a 72 y.o. male who was sent from the vascular surgeons office for bilateral cellulitis.  He reports increasing pain redness and swelling for the last several days.  He is running a low-grade fever and his blood pressures somewhat on the low side here.  He reports the pain in his legs is severe.  Achy and burning.  He is not currently having any other symptoms.      Past Medical History:  Diagnosis Date  . Depression   . 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 Gastroparesis   . Graves disease   . Hypercholesterolemia   . Hypertension   . Mantle cell lymphoma ([place]) 07/2014  . Peripheral neuropathy     Patient Active Problem List   Diagnosis Date Noted  . Lower limb ulcer, calf ([place]) 08/01/2019  . Left hip pain 07/16/2019  . Pain due to onychomycosis of toenails of both feet 04/03/2019  . Swelling of limb 02/07/2019  . Pancytopenia ([place]) 12/03/2018  . CKD (chronic kidney disease) stage 3, GFR 30-59 ml/min 09/16/2018  . Left shoulder pain 09/16/2018  . Chronic cholecystitis 12/29/2017  . Graves disease 12/29/2017  . Peripheral neuropathy 12/29/2017  . S/p nephrectomy 12/29/2017  . 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 Symptomatic bradycardia 12/02/2017  . AKI (acute kidney injury) ([place]) 12/02/2017  . Chronic systolic CHF (congestive heart failure) ([place]) 12/02/2017  . Saturday night paralysis 11/12/2017  . Hypoglycemia 10/28/2017  . Goals of care, counseling/discussion 10/24/2017  . Lymphedema 10/20/2017  . Iron deficiency anemia 09/11/2017  . Fall 08/13/2017  . Humerus  fracture 08/13/2017  . Anemia 01/12/2017  . Bilateral carotid artery stenosis 06/29/2016  . Hand laceration 12/16/2015  . Adjustment disorder with depressed mood 08/11/2015  . Cardiomyopathy, idiopathic ([place]) 04/22/2015  . CAD in native artery 11/02/2014  . Mantle cell lymphoma ([place]) 08/03/2014  . History of colonic polyps 04/29/2014  . Irregular heart beat 04/22/2014  . SOB (shortness of breath) 04/22/2014  . Stress 03/21/2014  . PVC (premature ventricular contraction) 02/21/2014  . GERD (gastroesophageal reflux disease) 10/23/2013  . Gastroparesis 06/25/2013  . Chest pain 04/13/2013  . Thyroid disease 04/13/2013  . Diabetes ([place]) 04/13/2013  . Essential hypertension, benign 04/13/2013  . Hypercholesterolemia 04/13/2013    Past Surgical History:  Procedure Laterality Date  . CHOLECYSTECTOMY    . NEPHRECTOMY     right  . PACEMAKER INSERTION N/A 12/07/2017   Procedure: INSERTION PACEMAKER DUAL CHAMBER INITIAL INSERT;  Surgeon: Isaias Cowman, MD;  Location: [facility] ORS;  Service: Cardiovascular;  Laterality: N/A;  . PORTA CATH INSERTION N/A 11/03/2017   Procedure: PORTA CATH INSERTION;  Surgeon: Katha Cabal, MD;  Location: [place] [facility];  Service: Cardiovascular;  Laterality: N/A;    Prior to Admission medications   Medication Sig Start Date End Date Taking? Authorizing Provider  ACCU-CHEK AVIVA PLUS test strip USE AS INSTRUCTED. USE 1 STRIP EVERY 3 HOURS . ACCU-CHECK AVIVA PLUS TEST STRIP. DX N86.767 01/06/17   [provider]  acetaminophen (TYLENOL) 500 MG tablet Take by mouth.    [provider]  amoxicillin-clavulanate (AUGMENTIN) 875-125 MG tablet  Take 1 tablet by mouth 2 (two) times daily. Patient not taking: Reported on 08/18/2019 07/18/19   Kris Hartmann, NP  carvedilol (COREG) 3.125 MG tablet Take 3.125 mg by mouth 2 (two) times daily with a meal.  10/07/17 07/25/19  [provider]  cyanocobalamin (,VITAMIN B-12,) 1000 MCG/ML  injection Inject into the muscle.    [provider]  ferrous sulfate 325 (65 FE) MG tablet Take 325 mg by mouth daily.    [provider]  furosemide (LASIX) 20 MG tablet Take 20 mg by mouth daily.    [provider]  GLUCAGON EMERGENCY 1 MG injection  11/30/17   [provider]  glucagon, human recombinant, (GLUCAGEN DIAGNOSTIC) 1 MG injection Use as directed 11/30/17   [provider]  glucose 4 GM chewable tablet Chew 1 tablet by mouth once as needed for low blood sugar.    [provider]  glucose blood (PRECISION QID TEST) test strip Use 8 (eight) times daily as directed. BAYER CONTOUR NEXT TEST STRIPS E10.649 03/03/18   [provider]  insulin aspart (NOVOLOG) 100 UNIT/ML injection Basal rate 12am 0.8 units per hour, 9am 0.9 units per hour. (total basal insulin 20.7 units). Carbohydrate ratio 1 units for every 8gm of carbohydrate. Correction factor 1 units for every 50mg /dl over target cbg. Target CBG 80-120 10/29/17   Loletha Grayer, MD  levothyroxine (SYNTHROID) 25 MCG tablet Take by mouth. 03/29/19   [provider]  lisinopril (PRINIVIL,ZESTRIL) 20 MG tablet Take 20 mg by mouth daily.    [provider]  Multiple Vitamin (MULTIVITAMIN) tablet Take 1 tablet by mouth daily.    [provider]  pravastatin (PRAVACHOL) 40 MG tablet Take by mouth. 03/29/19   [provider]  prochlorperazine (COMPAZINE) 10 MG tablet TAKE 1 TABLET (10 MG TOTAL) BY MOUTH EVERY 6 (SIX) HOURS AS NEEDED (NAUSEA OR VOMITING). 10/24/17   [provider]  spironolactone (ALDACTONE) 25 MG tablet Take 25 mg by mouth daily.    [provider]    Allergies Rofecoxib  Family History  Problem Relation Age of Onset  . Breast cancer Mother   . Diabetes Father   . Cancer Father        Lymphoma  . Alcohol abuse Maternal Uncle   . Diabetes Sister   . Heart disease Other        grandfather    Social  History Social History   Tobacco Use  . Smoking status: Former Research scientist (life sciences)  . Smokeless tobacco: Current User    Types: Chew  Substance Use Topics  . Alcohol use: Yes    Alcohol/week: 3.0 standard drinks    Types: 3 Cans of beer per week    Comment: nightly  . Drug use: Yes    Types: Codeine    Review of Systems  Constitutional: fever/chills Eyes: No visual changes. ENT: No sore throat. Cardiovascular: Denies chest pain. Respiratory: Denies shortness of breath. Gastrointestinal: No abdominal pain.  No nausea, no vomiting.  No diarrhea.  No constipation. Genitourinary: Negative for dysuria. Musculoskeletal: Negative for back pain. Skin: Bilateral superficial leg ulcers with marked redness swelling warmth and tenderness bilaterally Neurological: Negative for headaches, focal weakness or numbness.   ____________________________________________   PHYSICAL EXAM:  VITAL SIGNS: ED Triage Vitals  Enc Vitals Group     BP 08/18/19 1309 (!) 101/45     Pulse Rate 08/18/19 1309 (!) 59     Resp 08/18/19 1309 16  Temp 08/18/19 1309 99 F (37.2 C)     Temp Source 08/18/19 1309 Oral     SpO2 08/18/19 1309 95 %     Weight 08/18/19 1307 276 lb (125.2 kg)     Height 08/18/19 1307 5\' 10"  (1.778 m)     Head Circumference --      Peak Flow --      Pain Score 08/18/19 1307 7     Pain Loc --      Pain Edu? --      Excl. in [place]? --     Constitutional: Alert and oriented.  Appears chronically ill and breathing fast Eyes: Conjunctivae are normal.  Head: Atraumatic. Nose: No congestion/rhinnorhea. Mouth/Throat: Mucous membranes are moist.  Oropharynx non-erythematous. Neck: No stridor. Cardiovascular: Normal rate, regular rhythm. Grossly normal heart sounds.  Good peripheral circulation. Respiratory: Normal respiratory effort.  No retractions. Lungs CTAB. Gastrointestinal: Soft and nontender.  Some distention. No abdominal bruits.  Musculoskeletal: No lower extremity tenderness nor  edema.  No joint effusions. Neurologic:  Normal speech and language. No gross focal neurologic deficits are appreciated Skin:  Skin is warm, dry and intact except for on the legs which are as described above. No rash noted. Psychiatric: Mood and affect are normal. Speech and behavior are normal.  ____________________________________________   LABS (all labs ordered are listed, but only abnormal results are displayed)  Labs Reviewed  COMPREHENSIVE METABOLIC PANEL - Abnormal; Notable for the following components:      Result Value   CO2 19 (*)    Glucose, Bld 142 (*)    BUN 39 (*)    Creatinine, Ser 1.99 (*)    Calcium 8.4 (*)    Albumin 3.2 (*)    Alkaline Phosphatase 127 (*)    GFR calc non Af Amer 33 (*)    GFR calc Af Amer 38 (*)    All other components within normal limits  CBC WITH DIFFERENTIAL/PLATELET - Abnormal; Notable for the following components:   WBC 11.3 (*)    Platelets 121 (*)    Neutro Abs 10.1 (*)    Lymphs Abs 0.6 (*)    Abs Immature Granulocytes 0.08 (*)    All other components within normal limits  CULTURE, BLOOD (ROUTINE X 2)  CULTURE, BLOOD (ROUTINE X 2)  LACTIC ACID, PLASMA   ____________________________________________  EKG   ____________________________________________  RADIOLOGY  ED MD interpretation:   Official radiology report(s): No results found.  ____________________________________________   PROCEDURES  Procedure(s) performed (including Critical Care):  Procedures   ____________________________________________   INITIAL IMPRESSION / ASSESSMENT AND PLAN / ED COURSE  Gary [name] was evaluated in Emergency Department on 08/18/2019 for the symptoms described in the history of present illness. He was evaluated in the context of the global COVID-19 pandemic, which necessitated consideration that the patient might be at risk for infection with the SARS-CoV-2 virus that causes COVID-19. Institutional protocols and algorithms that  pertain to the evaluation of patients at risk for COVID-19 are in a state of rapid change based on information released by regulatory bodies including the CDC and federal and state organizations. These policies and algorithms were followed during the patient's care in the ED.     Patient with worsening bilateral leg redness tenderness and warmth.  Both the vascular surgeon I think this is likely a bad cellulitis.  Patient blood pressure is somewhat low at 101/45 and he has a low-grade fever.  We will give him some careful fluids  and antibiotics plan on admitting him.         ____________________________________________   FINAL CLINICAL IMPRESSION(S) / ED DIAGNOSES  Final diagnoses:  Cellulitis of lower extremity, unspecified laterality     ED Discharge Orders    None       Note:  This document was prepared using Dragon voice recognition software and may include unintentional dictation errors.    Nena Polio, MD 08/18/19 1758

## 2019-08-18 NOTE — Progress Notes (Signed)
Gary [name] was not placed  Patient was assessed by Dr. Lucky Cowboy and [name], NP and it was felt that the patient had severe cellulitis that would need IV antibiotics for treatment.  There was also concern due to the fact that the patient seemed weaker than normal. He had worsening shortness of breath with a 10 pound weight gain over the last week, and so it was felt the patient may also benefit from some IV diuresis.  Toes on the patient's left lower extremity appeared to be dusky blue.  Previous arterial study showed triphasic waveforms bilaterally.  The patient states that the leg hurts severely add over-the-counter pain medication has not been helpful.  We have advised the patient to go to the emergency room for evaluation and treatment.  The emergency room was called and notified of the patient's pending arrival.  Patient stated need to get home to get his wife that he will return to the emergency room within an hour or so.  We will see the patient back in office once he is discharged to resume wrapping his lower extremities.

## 2019-08-18 NOTE — ED Triage Notes (Signed)
Pt reports was sent to the ED from his vein doctor to have IV antibiotics for an infection in both legs. Pt reports has been dealing with this issue for some time now worsening over the last 4 days.

## 2019-08-18 NOTE — ED Notes (Addendum)
Ultrasound at bedside. Patient's pants removed.

## 2019-08-19 ENCOUNTER — Encounter: Payer: Self-pay | Admitting: *Deleted

## 2019-08-19 DIAGNOSIS — E11621 Type 2 diabetes mellitus with foot ulcer: Secondary | ICD-10-CM

## 2019-08-19 DIAGNOSIS — I5023 Acute on chronic systolic (congestive) heart failure: Secondary | ICD-10-CM | POA: Insufficient documentation

## 2019-08-19 DIAGNOSIS — L03119 Cellulitis of unspecified part of limb: Secondary | ICD-10-CM

## 2019-08-19 DIAGNOSIS — M79605 Pain in left leg: Secondary | ICD-10-CM

## 2019-08-19 DIAGNOSIS — L97509 Non-pressure chronic ulcer of other part of unspecified foot with unspecified severity: Secondary | ICD-10-CM

## 2019-08-19 DIAGNOSIS — M79604 Pain in right leg: Secondary | ICD-10-CM

## 2019-08-19 DIAGNOSIS — E10621 Type 1 diabetes mellitus with foot ulcer: Secondary | ICD-10-CM | POA: Insufficient documentation

## 2019-08-19 DIAGNOSIS — N1831 Chronic kidney disease, stage 3a: Secondary | ICD-10-CM

## 2019-08-19 LAB — BASIC METABOLIC PANEL
Anion gap: 10 (ref 5–15)
BUN: 39 mg/dL — ABNORMAL HIGH (ref 8–23)
CO2: 20 mmol/L — ABNORMAL LOW (ref 22–32)
Calcium: 8.1 mg/dL — ABNORMAL LOW (ref 8.9–10.3)
Chloride: 107 mmol/L (ref 98–111)
Creatinine, Ser: 1.91 mg/dL — ABNORMAL HIGH (ref 0.61–1.24)
GFR calc Af Amer: 40 mL/min — ABNORMAL LOW (ref >60)
GFR calc non Af Amer: 34 mL/min — ABNORMAL LOW (ref >60)
Glucose, Bld: 132 mg/dL — ABNORMAL HIGH (ref 70–99)
Potassium: 4.7 mmol/L (ref 3.5–5.1)
Sodium: 137 mmol/L (ref 135–145)

## 2019-08-19 LAB — GLUCOSE, CAPILLARY
Glucose-Capillary: 126 mg/dL — ABNORMAL HIGH (ref 70–99)
Glucose-Capillary: 119 mg/dL — ABNORMAL HIGH (ref 70–99)
Glucose-Capillary: 83 mg/dL (ref 70–99)

## 2019-08-19 LAB — CBC
HCT: 39.2 % (ref 39.0–52.0)
Hemoglobin: 12.6 g/dL — ABNORMAL LOW (ref 13.0–17.0)
MCH: 31.3 pg (ref 26.0–34.0)
MCHC: 32.1 g/dL (ref 30.0–36.0)
MCV: 97.5 fL (ref 80.0–100.0)
Platelets: 148 10*3/uL — ABNORMAL LOW (ref 150–400)
RBC: 4.02 MIL/uL — ABNORMAL LOW (ref 4.22–5.81)
RDW: 14 % (ref 11.5–15.5)
WBC: 8.3 10*3/uL (ref 4.0–10.5)
nRBC: 0 % (ref 0.0–0.2)

## 2019-08-19 LAB — SARS CORONAVIRUS 2 (TAT 6-24 HRS): SARS Coronavirus 2: NEGATIVE

## 2019-08-19 MED ORDER — MORPHINE SULFATE (PF) 2 MG/ML IV SOLN
INTRAVENOUS | Status: AC
  Administered 2019-08-19: 2 mg via INTRAVENOUS
  Filled 2019-08-19: qty 1

## 2019-08-19 MED ORDER — CARVEDILOL 6.25 MG PO TABS
3.1250 mg | ORAL_TABLET | Freq: Two times a day (BID) | ORAL | Status: DC
  Administered 2019-08-19 – 2019-08-21 (×4): 3.125 mg via ORAL
  Filled 2019-08-19 (×4): qty 1

## 2019-08-19 MED ORDER — SODIUM CHLORIDE 0.9 % IV BOLUS
250.0000 mL | Freq: Once | INTRAVENOUS | Status: AC
  Administered 2019-08-19: 250 mL via INTRAVENOUS

## 2019-08-19 MED ORDER — MORPHINE SULFATE (PF) 2 MG/ML IV SOLN
2.0000 mg | INTRAVENOUS | Status: DC | PRN
  Administered 2019-08-19 – 2019-08-20 (×3): 2 mg via INTRAVENOUS
  Filled 2019-08-19 (×2): qty 1

## 2019-08-19 MED ORDER — VANCOMYCIN HCL IN DEXTROSE 1-5 GM/200ML-% IV SOLN
1000.0000 mg | INTRAVENOUS | Status: DC
  Administered 2019-08-20 (×2): 1000 mg via INTRAVENOUS
  Filled 2019-08-19 (×3): qty 200

## 2019-08-19 MED ORDER — PRAVASTATIN SODIUM 20 MG PO TABS
40.0000 mg | ORAL_TABLET | Freq: Every day | ORAL | Status: DC
  Administered 2019-08-19 – 2019-08-21 (×3): 40 mg via ORAL
  Filled 2019-08-19: qty 1
  Filled 2019-08-19 (×2): qty 2

## 2019-08-19 MED ORDER — SPIRONOLACTONE 25 MG PO TABS
25.0000 mg | ORAL_TABLET | Freq: Every day | ORAL | Status: DC
  Administered 2019-08-19 – 2019-08-21 (×3): 25 mg via ORAL
  Filled 2019-08-19 (×3): qty 1

## 2019-08-19 MED ORDER — FERROUS SULFATE 325 (65 FE) MG PO TABS
325.0000 mg | ORAL_TABLET | Freq: Every day | ORAL | Status: DC
  Administered 2019-08-19 – 2019-08-21 (×3): 325 mg via ORAL
  Filled 2019-08-19 (×3): qty 1

## 2019-08-19 MED ORDER — LEVOTHYROXINE SODIUM 50 MCG PO TABS
25.0000 ug | ORAL_TABLET | Freq: Every day | ORAL | Status: DC
  Administered 2019-08-19 – 2019-08-21 (×3): 25 ug via ORAL
  Filled 2019-08-19 (×3): qty 1

## 2019-08-19 MED ORDER — LISINOPRIL 20 MG PO TABS
20.0000 mg | ORAL_TABLET | Freq: Every day | ORAL | Status: DC
  Administered 2019-08-19 – 2019-08-20 (×2): 20 mg via ORAL
  Filled 2019-08-19: qty 1
  Filled 2019-08-19: qty 2

## 2019-08-19 MED ORDER — CHLORHEXIDINE GLUCONATE CLOTH 2 % EX PADS: 6.0000 | MEDICATED_PAD | Freq: Every day | CUTANEOUS | Status: DC

## 2019-08-19 NOTE — ED Notes (Signed)
Confirmed with Provider that pt is to continue to use his own insulin pump. Pt administered 7 units from his pump at this time.

## 2019-08-19 NOTE — Consult Note (Signed)
[facility] [place]  Patient name: Gary [name] MRN: [id number] DOB: [birth year] Sex: male   REQUESTING PROVIDER:    Dr. Posey Pronto   REASON FOR CONSULT:    Infected venous stasis ulcers  HISTORY OF PRESENT ILLNESS:   Gary [name] is a 73 y.o. male, who was admitted for worsening bilateral lower extremity cellulitis.  The patient was recently seen for Unna boot change in the office.  He states that his legs have gotten worse this past week with increased pain and swelling in his ankles.  He is not having any fevers.  The patient suffers from chronic systolic heart failure, coronary artery disease, diabetes, stage III renal insufficiency and mantle cell lymphoma.  He has ongoing dyspnea with exertion which has progressed.  PAST MEDICAL HISTORY    Past Medical History:  Diagnosis Date  . Depression   . 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 Gastroparesis   . Graves disease   . Hypercholesterolemia   . Hypertension   . Mantle cell lymphoma ([place]) 07/2014  . Peripheral neuropathy      FAMILY HISTORY   Family History  Problem Relation Age of Onset  . Breast cancer Mother   . Diabetes Father   . Cancer Father        Lymphoma  . Alcohol abuse Maternal Uncle   . Diabetes Sister   . Heart disease Other        grandfather    SOCIAL HISTORY:   Social History   Socioeconomic History  . Marital status: Married    Spouse name: Not on file  . Number of children: 1  . Years of education: Not on file  . Highest education level: Not on file  Occupational History  . Not on file  Social Needs  . Financial resource strain: Not on file  . Food insecurity    Worry: Not on file    Inability: Not on file  . Transportation needs    Medical: Not on file    Non-medical: Not on file  Tobacco Use  . Smoking status: Former Research scientist (life sciences)  . Smokeless tobacco: Current User   Types: Chew  Substance and Sexual Activity  . Alcohol use: Yes    Alcohol/week: 3.0 standard drinks    Types: 3 Cans of beer per week    Comment: nightly  . Drug use: Yes    Types: Codeine  . Sexual activity: Not Currently  Lifestyle  . Physical activity    Days per week: Not on file    Minutes per session: Not on file  . Stress: Not on file  Relationships  . Social Herbalist on phone: Not on file    Gets together: Not on file    Attends religious service: Not on file    Active member of club or organization: Not on file    Attends meetings of clubs or organizations: Not on file    Relationship status: Not on file  . Intimate partner violence    Fear of current or ex partner: Not on file    Emotionally abused: Not on file    Physically abused: Not on file    Forced sexual activity: Not on file  Other Topics Concern  . Not on file  Social History Narrative  . Not on file    ALLERGIES:    Allergies  Allergen Reactions  . Rofecoxib Nausea Only  CURRENT MEDICATIONS:    Current Facility-Administered Medications  Medication Dose Route Frequency Provider Last Rate Last Dose  . carvedilol (COREG) tablet 3.125 mg  3.125 mg Oral BID WC Tu, Ching T, DO   3.125 mg at 08/19/19 0817  . cefTRIAXone (ROCEPHIN) 2 g in sodium chloride 0.9 % 100 mL IVPB  2 g Intravenous Q24H Tu, Ching T, DO   Stopped at 08/19/19 1807  . enoxaparin (LOVENOX) injection 40 mg  40 mg Subcutaneous Q24H Tu, Ching T, DO   40 mg at 08/18/19 2208  . ferrous sulfate tablet 325 mg  325 mg Oral Daily Tu, Ching T, DO   325 mg at 08/19/19 0954  . levothyroxine (SYNTHROID) tablet 25 mcg  25 mcg Oral Q0600 Tu, Ching T, DO   25 mcg at 08/19/19 0608  . lisinopril (ZESTRIL) tablet 20 mg  20 mg Oral Daily Tu, Ching T, DO   20 mg at 08/19/19 0954  . morphine 2 MG/ML injection 2 mg  2 mg Intravenous Q2H PRN Tu, Ching T, DO   2 mg at 08/19/19 0143  . pravastatin (PRAVACHOL) tablet 40 mg  40 mg Oral Daily Tu,  Ching T, DO   40 mg at 08/19/19 0954  . sodium chloride flush (NS) 0.9 % injection 3 mL  3 mL Intravenous Once Nena Polio, MD      . spironolactone (ALDACTONE) tablet 25 mg  25 mg Oral Daily Tu, Ching T, DO   25 mg at 08/19/19 0954  . vancomycin (VANCOCIN) IVPB 1000 mg/200 mL premix  1,000 mg Intravenous Q24H Oswald Hillock, RPH       Facility-Administered Medications Ordered in Other Encounters  Medication Dose Route Frequency Provider Last Rate Last Dose  . heparin lock flush 100 unit/mL  500 Units Intravenous Once Lloyd Huger, MD      . sodium chloride flush (NS) 0.9 % injection 10 mL  10 mL Intravenous PRN Lloyd Huger, MD   10 mL at 02/01/18 0829  . sodium chloride flush (NS) 0.9 % injection 10 mL  10 mL Intravenous PRN Lloyd Huger, MD   10 mL at 04/05/18 0800    REVIEW OF SYSTEMS:   [X]  denotes positive finding, [ ]  denotes negative finding Cardiac  Comments:  Chest pain or chest pressure:    Shortness of breath upon exertion:    Short of breath when lying flat:    Irregular heart rhythm:        Vascular    Pain in calf, thigh, or hip brought on by ambulation:    Pain in feet at night that wakes you up from your sleep:     Blood clot in your veins:    Leg swelling:  x       Pulmonary    Oxygen at home:    Productive cough:     Wheezing:         Neurologic    Sudden weakness in arms or legs:     Sudden numbness in arms or legs:     Sudden onset of difficulty speaking or slurred speech:    Temporary loss of vision in one eye:     Problems with dizziness:         Gastrointestinal    Blood in stool:      Vomited blood:         Genitourinary    Burning when urinating:     Blood in urine:  Psychiatric    Major depression:         Hematologic    Bleeding problems:    Problems with blood clotting too easily:        Skin    Rashes or ulcers:        Constitutional    Fever or chills:     PHYSICAL EXAM:   Vitals:   08/19/19  1700 08/19/19 1702 08/19/19 1800 08/19/19 1822  BP: (!) 72/43 104/67 (!) 109/97 108/65  Pulse: 96 (!) 101 95 (!) 103  Resp: 18 19 17 16   Temp:    97.9 F (36.6 C)  TempSrc:    Oral  SpO2: 100% 100% 100% 99%  Weight:      Height:        GENERAL: The patient is a well-nourished male, in no acute distress. The vital signs are documented above. CARDIAC: There is a regular rate and rhythm.  VASCULAR: Severe bilateral lower extremity edema with cellulitis PULMONARY: Nonlabored respirations ABDOMEN: Soft and non-tender with normal pitched bowel sounds.  MUSCULOSKELETAL: There are no major deformities or cyanosis. NEUROLOGIC: No focal weakness or paresthesias are detected. SKIN: See photo below PSYCHIATRIC: The patient has a normal affect.    STUDIES:   I have reviewed the following:  March 2020 +-------+-----------+-----------+------------+------------+ ABI/TBIToday's ABIToday's TBIPrevious ABIPrevious TBI +-------+-----------+-----------+------------+------------+ Right  1.48                  1.79                     +-------+-----------+-----------+------------+------------+ Left   1.42                  1.79                     +-------+-----------+-----------+------------+------------+  Venous reflux April 2019: Right: Normal reflux times were noted in the great saphenous vein at the proximal thigh, great saphenous vein at the mid thigh, great saphenous vein at the distal thigh, great saphenous vein at the knee, great saphenous vein at the proximal calf, great  saphenous vein at the mid calf, great saphenous vein at the distal calf, and small saphenous vein at the level of the sapheno-popliteal junction. Left: Normal reflux times were detected in the great saphenous vein at the proximal thigh, great saphenous vein at the mid thigh, great saphenous vein at the distal thigh, great saphenous vein at the proximal calf, great saphenous vein at the mid calf,  and  small saphenous vein at the level of the sapheno-popliteal junction.  ASSESSMENT and PLAN   Chronic leg swelling with cellulitis.  Previously the patient has had normal ABIs and a normal venous reflux exam.  No vascular intervention is recommended at this time.  He will need leg elevation and external compression with Unna boot as well as IV antibiotics.   Leia Alf, MD, FACS [facility] [phone] Pager [phone]

## 2019-08-19 NOTE — ED Notes (Signed)
Pt is alert and oriented. Watching TV. BLE bright red.  Black noted around right great toenail.  Dry and scaly around parts of legs.  White areas noted throughout wounds.

## 2019-08-19 NOTE — ED Notes (Signed)
Pt finished eating his lunch, and is watching TV. Provided phone for pt to contact family.

## 2019-08-19 NOTE — ED Notes (Signed)
Pt states he gave 10 units insulin via his insulin pump

## 2019-08-19 NOTE — ED Notes (Signed)
Placed on hospital bed. Waiting for admission room. NAD. No needs at this time.

## 2019-08-19 NOTE — ED Notes (Signed)
Spoke with Dr Posey Pronto about pt's soft BP verbal orders received.

## 2019-08-19 NOTE — ED Notes (Signed)
Attempted to call floor to give report and they were unable to take at this time.

## 2019-08-19 NOTE — ED Notes (Signed)
Provided pt with urinal to void, asked pt to call out when he wanted to try to use the urinal.

## 2019-08-19 NOTE — ED Notes (Signed)
Pt given phone to call family.

## 2019-08-19 NOTE — Progress Notes (Signed)
[place] [facility] [place] NAME: Gary [name]    MR#:  [id number]  DATE OF BIRTH:  1946/08/05  SUBJECTIVE:  Came in with worsening bilateral LE edema and cellulitis. Some sob Pt on RA >95% Seen in ER  REVIEW OF SYSTEMS:   Review of Systems  Constitutional: Negative for chills, fever and weight loss.  HENT: Negative for ear discharge, ear pain and nosebleeds.   Eyes: Negative for blurred vision, pain and discharge.  Respiratory: Positive for shortness of breath. Negative for sputum production, wheezing and stridor.   Cardiovascular: Positive for leg swelling. Negative for chest pain, palpitations, orthopnea and PND.  Gastrointestinal: Negative for abdominal pain, diarrhea, nausea and vomiting.  Genitourinary: Negative for frequency and urgency.  Musculoskeletal: Negative for back pain and joint pain.  Skin:       Bilateral LE cellulitis  Neurological: Positive for weakness. Negative for sensory change, speech change and focal weakness.  Psychiatric/Behavioral: Negative for depression and hallucinations. The patient is not nervous/anxious.    Tolerating Diet:yes Tolerating PT:   DRUG ALLERGIES:   Allergies  Allergen Reactions   Rofecoxib Nausea Only    VITALS:  Blood pressure (!) 96/49, pulse (!) 104, temperature 98.2 F (36.8 C), temperature source Oral, resp. rate 20, height 5\' 10"  (1.778 m), weight 125.2 kg, SpO2 100 %.  PHYSICAL EXAMINATION:   Physical Exam  GENERAL:  73 y.o.-year-old patient lying in the bed with no acute distress.obese, Disheveled, chronically ill  EYES: Pupils equal, round, reactive to light and accommodation. No scleral icterus. Extraocular muscles intact.  HEENT: Head atraumatic, normocephalic. Oropharynx and nasopharynx clear.  NECK:  Supple, no jugular venous distention. No thyroid enlargement, no tenderness.  LUNGS: Normal breath sounds bilaterally, no wheezing, rales, rhonchi. No use of accessory muscles  of respiration.  CARDIOVASCULAR: S1, S2 normal. No murmurs, rubs, or gallops.  ABDOMEN: Soft, nontender, nondistended. Bowel sounds present. No organomegaly or mass.  EXTREMITIES: Day of admission        NEUROLOGIC: Cranial nerves II through XII are intact. No focal Motor or sensory deficits b/l.   PSYCHIATRIC:  patient is alert and oriented x 3.  SKIN: No obvious rash, lesion, or ulcer.   LABORATORY PANEL:  CBC Recent Labs  Lab 08/19/19 0331  WBC 8.3  HGB 12.6*  HCT 39.2  PLT 148*    Chemistries  Recent Labs  Lab 08/18/19 1310 08/19/19 0331  NA 135 137  K 4.5 4.7  CL 105 107  CO2 19* 20*  GLUCOSE 142* 132*  BUN 39* 39*  CREATININE 1.99* 1.91*  CALCIUM 8.4* 8.1*  AST 37  --   ALT 39  --   ALKPHOS 127*  --   BILITOT 0.9  --    Cardiac Enzymes No results for input(s): TROPONINI in the last 168 hours. RADIOLOGY:  US Venous Img Lower Unilateral Left (dvt)  Result Date: 08/18/2019 CLINICAL DATA:  Bilateral lower extremity pain and swelling, left greater than right EXAM: LEFT LOWER EXTREMITY VENOUS DOPPLER ULTRASOUND TECHNIQUE: Gray-scale sonography with graded compression, as well as color Doppler and duplex ultrasound were performed to evaluate the lower extremity deep venous systems from the level of the common femoral vein and including the common femoral, femoral, profunda femoral, popliteal and calf veins including the posterior tibial, peroneal and gastrocnemius veins when visible. Spectral Doppler was utilized to evaluate flow at rest and with distal augmentation maneuvers in the common femoral, femoral and popliteal veins. COMPARISON:  Doppler ultrasound 08/03/2014 FINDINGS: Contralateral Common Femoral Vein: Respiratory phasicity is normal and symmetric with the symptomatic side. No evidence of thrombus. Normal compressibility. Common Femoral Vein: No evidence of thrombus. Normal compressibility, respiratory phasicity and response to augmentation. Saphenofemoral  Junction: No evidence of thrombus. Normal compressibility and flow on color Doppler imaging. Profunda Femoral Vein: No evidence of thrombus. Normal compressibility and flow on color Doppler imaging. Femoral Vein: No evidence of thrombus. Normal compressibility, respiratory phasicity and response to augmentation. Popliteal Vein: No evidence of thrombus. Normal compressibility, respiratory phasicity and response to augmentation. Calf Veins: No evidence of thrombus posterior tibial veins. Normal compressibility and flow on color Doppler imaging. Peroneal veins are poorly visualized due to extensive lower extremity edema. Venous Reflux:  None. Other Findings: Extensive lower extremity edema limiting evaluation of the peroneal veins as above. IMPRESSION: Unable to visualize the peroneal veins in the calf secondary to lower extremity edema. No evidence of deep venous thrombosis in the visualized deep venous system of the left lower extremity. Electronically Signed   By: Lovena Le M.D.   On: 08/18/2019 21:31   ASSESSMENT AND PLAN:   Gary [name] is a 73 y.o. male with medical history significant of Chronic systolic heart failure (EF of 55-65% in 11/2017), bilateral carotid artery stenosis, CAD, hypertension, Graves' disease, Type 1 diabetes with gastroparesis, CKD stage III, mantle cell lymphoma for worsening bilateral lower extremity cellulitis. Patient has been following with vascular surgery outpatient for bilateral Unna wrap and management of his lower extremity lymphedema.  *Bilateral lower extremity cellulitis - Left LE appears more edematous than right - Left venous DVT ultrasound negative  -IV Vancomycin and Rocephin  - wound care per RN -vascular consult with Dr Lona Kettle message sent  * Acute on chronic CHF systolic with significant leg edema - suspect worsening systolic heart failure and body habitus from morbid obesity  - last echo in 11/2017 with EF of 55-65% -echo in [place] 2019 (as out  pt) showed EF 35% - daily IV 40mg  Lasix - check Intake and output - daily weights - continue spironolactone and Coreg  *h/o Graves disease - continue synthroid  Type 1 Diabetes  - continue insulin pump settings per endo and check ACHS and mealtime - Last HbA1C of 6.3  *Chronic kidney disease stage 3 with Diabetic Nephropathy - stable creatinine - avoid nephrotoxic agent   *Mantle cell lymphoma s/p chemo -stable  *Hyperlipidemia - continue pravastatin  *DVT prophylaxis:.Lovenox  No family in ER  PT/CM to see pt  Case discussed with Care Management/Social Worker. Management plans discussed with the patient and they are in agreement.  CODE STATUS: Full   TOTAL TIME TAKING CARE OF THIS PATIENT: *35* minutes.  >50% time spent on counselling and coordination of care  POSSIBLE D/C IN *?* DAYS, DEPENDING ON CLINICAL CONDITION.  Note: This dictation was prepared with Dragon dictation along with smaller phrase technology. Any transcriptional errors that result from this process are unintentional.  Fritzi Mandes M.D on 08/19/2019 at 2:17 PM  Between 7am to 6pm - Pager - [phone]  After 6pm go to [email or web address] - password [email or web address] [facility]   CC: Primary care physician; [name], MDPatient ID: Gary [name], male   DOB: [birth year], 73 y.o.   MRN: [id number]

## 2019-08-19 NOTE — ED Notes (Signed)
Pt eating lunch. No needs at this time. Remains to wait on admit bed.  Unlabored.

## 2019-08-20 ENCOUNTER — Inpatient Hospital Stay
Admit: 2019-08-20 | Discharge: 2019-08-20 | Disposition: A | Discharge status: Home / Self Care | Payer: Medicare Other | Attending: Family Medicine | Admitting: Family Medicine

## 2019-08-20 DIAGNOSIS — I5022 Chronic systolic (congestive) heart failure: Secondary | ICD-10-CM

## 2019-08-20 LAB — GLUCOSE, CAPILLARY
Glucose-Capillary: 219 mg/dL — ABNORMAL HIGH (ref 70–99)
Glucose-Capillary: 95 mg/dL (ref 70–99)
Glucose-Capillary: 157 mg/dL — ABNORMAL HIGH (ref 70–99)
Glucose-Capillary: 189 mg/dL — ABNORMAL HIGH (ref 70–99)

## 2019-08-20 MED ORDER — ACETAMINOPHEN 325 MG PO TABS: 650.0000 mg | ORAL_TABLET | Freq: Four times a day (QID) | ORAL | Status: DC | PRN

## 2019-08-20 MED ORDER — FUROSEMIDE 10 MG/ML IJ SOLN
40.0000 mg | Freq: Two times a day (BID) | INTRAMUSCULAR | Status: DC
  Administered 2019-08-21: 40 mg via INTRAVENOUS
  Filled 2019-08-20 (×3): qty 4

## 2019-08-20 MED ORDER — LISINOPRIL 2.5 MG PO TABS
2.5000 mg | ORAL_TABLET | Freq: Every day | ORAL | Status: DC
  Filled 2019-08-20: qty 1

## 2019-08-20 MED ORDER — MORPHINE SULFATE (PF) 2 MG/ML IV SOLN
1.0000 mg | Freq: Four times a day (QID) | INTRAVENOUS | Status: DC | PRN
  Administered 2019-08-20: 1 mg via INTRAVENOUS
  Filled 2019-08-20: qty 1

## 2019-08-20 NOTE — Progress Notes (Signed)
Full note to follow Patient admitted with worsening cellulitis. He has had venous reflux studies and ABIs which were normal in the past.  He has been treated with UNNA boots  Nothing new to add.  Keep legs elevated.  HE needs external compression with UNNA boots and broad spectrum IV abx   [name]  Gary [name]

## 2019-08-20 NOTE — Progress Notes (Signed)
[facility] at [place] NAME: Gary [name]    MR#:  [id number]  DATE OF BIRTH:  [birth year]  SUBJECTIVE:  Came in with worsening bilateral LE edema and cellulitis. Some sob Pt on RA >95% Feels legs looking better  REVIEW OF SYSTEMS:   Review of Systems  Constitutional: Negative for chills, fever and weight loss.  HENT: Negative for ear discharge, ear pain and nosebleeds.   Eyes: Negative for blurred vision, pain and discharge.  Respiratory: Positive for shortness of breath. Negative for sputum production, wheezing and stridor.   Cardiovascular: Positive for leg swelling. Negative for chest pain, palpitations, orthopnea and PND.  Gastrointestinal: Negative for abdominal pain, diarrhea, nausea and vomiting.  Genitourinary: Negative for frequency and urgency.  Musculoskeletal: Negative for back pain and joint pain.  Skin:       Bilateral LE cellulitis  Neurological: Positive for weakness. Negative for sensory change, speech change and focal weakness.  Psychiatric/Behavioral: Negative for depression and hallucinations. The patient is not nervous/anxious.    Tolerating Diet:yes Tolerating PT:   DRUG ALLERGIES:   Allergies  Allergen Reactions   Rofecoxib Nausea Only    VITALS:  Blood pressure 109/71, pulse (!) 108, temperature 98 F (36.7 C), temperature source Oral, resp. rate 20, height 5\' 10"  (1.778 m), weight 125.3 kg, SpO2 97 %.  PHYSICAL EXAMINATION:   Physical Exam  GENERAL:  73 y.o.-year-old patient lying in the bed with no acute distress.obese, Disheveled, chronically ill  EYES: Pupils equal, round, reactive to light and accommodation. No scleral icterus. Extraocular muscles intact.  HEENT: Head atraumatic, normocephalic. Oropharynx and nasopharynx clear.  NECK:  Supple, no jugular venous distention. No thyroid enlargement, no tenderness.  LUNGS: Normal breath sounds bilaterally, no wheezing, rales, rhonchi. No use of  accessory muscles of respiration.  CARDIOVASCULAR: S1, S2 normal. No murmurs, rubs, or gallops.  ABDOMEN: Soft, nontender, nondistended. Bowel sounds present. No organomegaly or mass.  EXTREMITIES: Day of admission        NEUROLOGIC: Cranial nerves II through XII are intact. No focal Motor or sensory deficits b/l.   PSYCHIATRIC:  patient is alert and oriented x 3.  SKIN: No obvious rash, lesion, or ulcer.   LABORATORY PANEL:  CBC Recent Labs  Lab 08/19/19 0331  WBC 8.3  HGB 12.6*  HCT 39.2  PLT 148*    Chemistries  Recent Labs  Lab 08/18/19 1310 08/19/19 0331  NA 135 137  K 4.5 4.7  CL 105 107  CO2 19* 20*  GLUCOSE 142* 132*  BUN 39* 39*  CREATININE 1.99* 1.91*  CALCIUM 8.4* 8.1*  AST 37  --   ALT 39  --   ALKPHOS 127*  --   BILITOT 0.9  --    Cardiac Enzymes No results for input(s): TROPONINI in the last 168 hours. RADIOLOGY:  US Venous Img Lower Unilateral Left (dvt)  Result Date: 08/18/2019 CLINICAL DATA:  Bilateral lower extremity pain and swelling, left greater than right EXAM: LEFT LOWER EXTREMITY VENOUS DOPPLER ULTRASOUND TECHNIQUE: Gray-scale sonography with graded compression, as well as color Doppler and duplex ultrasound were performed to evaluate the lower extremity deep venous systems from the level of the common femoral vein and including the common femoral, femoral, profunda femoral, popliteal and calf veins including the posterior tibial, peroneal and gastrocnemius veins when visible. Spectral Doppler was utilized to evaluate flow at rest and with distal augmentation maneuvers in the common femoral, femoral and popliteal veins. COMPARISON:  Doppler ultrasound 08/03/2014 FINDINGS: Contralateral Common Femoral Vein: Respiratory phasicity is normal and symmetric with the symptomatic side. No evidence of thrombus. Normal compressibility. Common Femoral Vein: No evidence of thrombus. Normal compressibility, respiratory phasicity and response to  augmentation. Saphenofemoral Junction: No evidence of thrombus. Normal compressibility and flow on color Doppler imaging. Profunda Femoral Vein: No evidence of thrombus. Normal compressibility and flow on color Doppler imaging. Femoral Vein: No evidence of thrombus. Normal compressibility, respiratory phasicity and response to augmentation. Popliteal Vein: No evidence of thrombus. Normal compressibility, respiratory phasicity and response to augmentation. Calf Veins: No evidence of thrombus posterior tibial veins. Normal compressibility and flow on color Doppler imaging. Peroneal veins are poorly visualized due to extensive lower extremity edema. Venous Reflux:  None. Other Findings: Extensive lower extremity edema limiting evaluation of the peroneal veins as above. IMPRESSION: Unable to visualize the peroneal veins in the calf secondary to lower extremity edema. No evidence of deep venous thrombosis in the visualized deep venous system of the left lower extremity. Electronically Signed   By: Lovena Le M.D.   On: 08/18/2019 21:31   ASSESSMENT AND PLAN:   Gary [name] is a 73 y.o. male with medical history significant of Chronic systolic heart failure (EF of 55-65% in 11/2017), bilateral carotid artery stenosis, CAD, hypertension, Graves' disease, Type 1 diabetes with gastroparesis, CKD stage III, mantle cell lymphoma for worsening bilateral lower extremity cellulitis. Patient has been following with vascular surgery outpatient for bilateral Unna wrap and management of his lower extremity lymphedema.  *Bilateral lower extremity cellulitis-acute on chronic with h/o [place] Lymphedema - Left LE appears more edematous than right - Left venous DVT ultrasound negative  -IV Vancomycin and Rocephin  - wound care per RN--pt had Unna bott as out pt as per Vascular recommendation -vascular consult with Dr Lona Kettle message sent  * Acute on chronic CHF systolic with significant leg edema - suspect  worsening systolic heart failure and body habitus from morbid obesity  - last echo in 11/2017 with EF of 55-65% -echo in [place] 2019 (as out pt) showed EF 35% - daily IV 40mg  Lasix bid - check Intake and output - daily weights - continue spironolactone and Coreg. Decreased lisinopril to 2.5 mg due to soft bp  *Type 1 Diabetes  - continue insulin pump settings per endo and check ACHS and mealtime - Last HbA1C of 6.3  *Chronic kidney disease stage 3 with Diabetic Nephropathy - stable creatinine - avoid nephrotoxic agent   *Mantle cell lymphoma s/p chemo -stable. Follows with Dr [name]  *Hyperlipidemia - continue pravastatin  *DVT prophylaxis:.Lovenox  No family in ER  PT/CM to see pt  Case discussed with Care Management/Social Worker. Management plans discussed with the patient and they are in agreement.  CODE STATUS: Full   TOTAL TIME TAKING CARE OF THIS PATIENT: *35* minutes.  >50% time spent on counselling and coordination of care  POSSIBLE D/C IN *?* DAYS, DEPENDING ON CLINICAL CONDITION.  Note: This dictation was prepared with Dragon dictation along with smaller phrase technology. Any transcriptional errors that result from this process are unintentional.  Fritzi Mandes M.D on 08/20/2019 at 11:06 AM  Between 7am to 6pm - Pager - [phone]  After 6pm go to [email or web address] - password [email or web address] [facility]   CC: Primary care physician; [name], MDPatient ID: Gary [name], male   DOB: Nov 27, 1945, 73 y.o.   MRN: [id number]

## 2019-08-21 DIAGNOSIS — I890 Lymphedema, not elsewhere classified: Secondary | ICD-10-CM

## 2019-08-21 DIAGNOSIS — E1151 Type 2 diabetes mellitus with diabetic peripheral angiopathy without gangrene: Secondary | ICD-10-CM

## 2019-08-21 LAB — CREATININE, SERUM
Creatinine, Ser: 2.76 mg/dL — ABNORMAL HIGH (ref 0.61–1.24)
GFR calc Af Amer: 26 mL/min — ABNORMAL LOW (ref >60)
GFR calc non Af Amer: 22 mL/min — ABNORMAL LOW (ref >60)

## 2019-08-21 LAB — GLUCOSE, CAPILLARY
Glucose-Capillary: 126 mg/dL — ABNORMAL HIGH (ref 70–99)
Glucose-Capillary: 130 mg/dL — ABNORMAL HIGH (ref 70–99)

## 2019-08-21 MED ORDER — CEPHALEXIN 250 MG PO CAPS: 250.0000 mg | ORAL_CAPSULE | Freq: Two times a day (BID) | ORAL | 0 refills | Status: DC

## 2019-08-21 MED ORDER — CEPHALEXIN 500 MG PO CAPS
500.0000 mg | ORAL_CAPSULE | Freq: Two times a day (BID) | ORAL | Status: DC
  Administered 2019-08-21: 500 mg via ORAL
  Filled 2019-08-21: qty 1

## 2019-08-21 MED ORDER — CHLORHEXIDINE GLUCONATE CLOTH 2 % EX PADS
6.0000 | MEDICATED_PAD | Freq: Every day | CUTANEOUS | Status: DC
  Administered 2019-08-21: 6 via TOPICAL

## 2019-08-21 NOTE — TOC Transition Note (Signed)
Transition of Care [facility]) - CM/SW Discharge Note   Patient Details  Name: Gary [name] MRN: [id number] Date of Birth: 04-Mar-1946  Transition of Care [facility]) CM/SW Contact:  Beverly Sessions, RN Phone Number: 08/21/2019, 4:45 PM   Clinical Narrative:    Patient admitted with cellulitis  Patient lives at home with wife, she is at bedside and to transport home today  PCP Nicki Reaper  Denies issues obtaining medications  MD ordered home health RN and PT.  Patient adamantly declines all home health services. Patient states he goes outpatient to have his Una boots changed on Tuesday and Friday, and doesn't want to change up the regimin.   MD and bedside RN notified      Final next level of care: Home/Self Care Barriers to Discharge: Barriers Resolved   Patient Goals and CMS Choice        Discharge Placement                       Discharge Plan and Services                          HH Arranged: Patient Refused [facility]          Social Determinants of Health (SDOH) Interventions     Readmission Risk Interventions No flowsheet data found.

## 2019-08-21 NOTE — Care Management Important Message (Signed)
Important Message  Patient Details  Name: Gary [name] MRN: [id number] Date of Birth: 1946-04-14   Medicare Important Message Given:  Yes     Dannette Barbara 08/21/2019, 12:25 PM

## 2019-08-21 NOTE — Plan of Care (Signed)
  Problem: Education: Goal: Knowledge of General Education information will improve Description: Including pain rating scale, medication(s)/side effects and non-pharmacologic comfort measures Outcome: Completed/Met   Problem: Health Behavior/Discharge Planning: Goal: Ability to manage health-related needs will improve Outcome: Completed/Met   Problem: Clinical Measurements: Goal: Ability to maintain clinical measurements within normal limits will improve Outcome: Completed/Met Goal: Will remain free from infection Outcome: Completed/Met Goal: Diagnostic test results will improve Outcome: Completed/Met Goal: Respiratory complications will improve Outcome: Completed/Met Goal: Cardiovascular complication will be avoided Outcome: Completed/Met   Problem: Activity: Goal: Risk for activity intolerance will decrease Outcome: Completed/Met   Problem: Nutrition: Goal: Adequate nutrition will be maintained Outcome: Completed/Met   Problem: Elimination: Goal: Will not experience complications related to bowel motility Outcome: Completed/Met Goal: Will not experience complications related to urinary retention Outcome: Completed/Met   Problem: Coping: Goal: Level of anxiety will decrease Outcome: Completed/Met   Problem: Safety: Goal: Ability to remain free from injury will improve Outcome: Completed/Met   Problem: Skin Integrity: Goal: Risk for impaired skin integrity will decrease Outcome: Completed/Met

## 2019-08-21 NOTE — Progress Notes (Signed)
[facility] Daily Progress Note   Subjective: Patient seen and examined this AM after unna wraps placed.  No issues overnight.  Objective: Vitals:   08/20/19 2052 08/21/19 0421 08/21/19 0829 08/21/19 0853  BP: (!) 89/52 91/70 (!) 91/47 119/68  Pulse: (!) 101 79 93   Resp: 20 18    Temp: 99 F (37.2 C) 98.4 F (36.9 C) 98.5 F (36.9 C)   TempSrc: Oral Oral Oral   SpO2: 97% 99% 97%   Weight:  128.8 kg    Height:        Intake/Output Summary (Last 24 hours) at 08/21/2019 1138 Last data filed at 08/21/2019 0500 Gross per 24 hour  Intake 720 ml  Output 200 ml  Net 520 ml   Physical Exam: A&Ox3, NAD CV: RRR Pulmonary: CTA Bilaterally Abdomen: Soft, Nontender, Nondistended Vascular:  Bilateral Lower Extremity: unna wraps placed.  Erythema has improved.  Swelling is improved.  No wounds or ulcerations.  No weeping.   Laboratory: CBC    Component Value Date/Time   WBC 8.3 08/19/2019 0331   HGB 12.6 (L) 08/19/2019 0331   HGB 13.2 01/16/2015 1429   HCT 39.2 08/19/2019 0331   HCT 38.9 (L) 01/16/2015 1429   PLT 148 (L) 08/19/2019 0331   PLT 156 01/16/2015 1429   BMET    Component Value Date/Time   NA 137 08/19/2019 0331   NA 138 07/15/2014 0515   K 4.7 08/19/2019 0331   K 3.5 01/16/2015 1429   CL 107 08/19/2019 0331   CL 110 (H) 07/15/2014 0515   CO2 20 (L) 08/19/2019 0331   CO2 24 07/15/2014 0515   GLUCOSE 132 (H) 08/19/2019 0331   GLUCOSE 145 (H) 07/15/2014 0515   BUN 39 (H) 08/19/2019 0331   BUN 17 07/15/2014 0515   CREATININE 2.76 (H) 08/21/2019 0428   CREATININE 0.89 01/16/2015 1429   CALCIUM 8.1 (L) 08/19/2019 0331   CALCIUM 7.1 (L) 07/15/2014 0515   GFRNONAA 22 (L) 08/21/2019 0428   GFRNONAA >60 01/16/2015 1429   GFRAA 26 (L) 08/21/2019 0428   GFRAA >60 01/16/2015 1429   Assessment/Planning: The patient is a 73 year old male well-known to our service with chronic lymphedema requiring unna boot therapy 1) the patient is  notoriously noncompliant in regard to compression socks, elevation, exercise and use of his lymphedema pump. 2) had a long discussion with the patient in regard to the importance of compression, elevation and use of his lymphedema pump in relation to recurrent cellulitis.  3) we will see the patient in approximately one week from discharge to continue unna boot therapy and undergo an ABI 4) Vascular to sign off at this time.  Discussed with Dr. Ellis Parents Tylene Quashie PA-C 08/21/2019 11:38 AM

## 2019-08-21 NOTE — Evaluation (Signed)
Physical Therapy Evaluation Patient Details Name: Gary [name] MRN: [id number] DOB: Jul 02, 1946 Today's Date: 08/21/2019   History of Present Illness  [name] is a 60yoM who comes to [facility] on 11/6 from outpatient for management of BLE cellulitis. PMH: lymphedema, PND, graves disease, chronic LLE wounds.  Clinical Impression  Pt admitted with above diagnosis. Pt currently with functional limitations due to the deficits listed below (see "PT Problem List"). Upon entry, pt in recliner, awake and agreeable to participate. The pt is alert and oriented x4, pleasant, conversational, and generally a good historian. Pt reports progressive weakness over that past couple weeks. Min guard assist transfers and supervision for AMB with RW. Pt given cues for safe RW use in BR, but he uses RW ad lib in BR without follow cues. Pt able to balance with single UE to perform bathroom stuff, no LOB. AMB to and from BR, pt has increased SOB stopping to recover q 62ft, SpO2 98%. Functional mobility assessment demonstrates increased effort/time requirements, poor tolerance, and need for physical assistance, whereas the patient performed these at a higher level of independence PTA. Pt has failry good awareness of limitations, but does not always make prudent decisions regarding safety. Pt will benefit from skilled PT intervention to increase independence and safety with basic mobility in preparation for discharge to the venue listed below.       Follow Up Recommendations Home health PT;Supervision for mobility/OOB;Supervision - Intermittent    Equipment Recommendations  None recommended by PT    Recommendations for Other Services       Precautions / Restrictions Precautions Precautions: Fall Restrictions Weight Bearing Restrictions: No      Mobility  Bed Mobility               General bed mobility comments: received up in chair, reports to have needed minA from RN to get to EOB and then to  recliner.  Transfers Overall transfer level: Needs assistance Equipment used: None Transfers: Sit to/from Stand Sit to Stand: Min guard         General transfer comment: quite unsteady, really should use RW but stands without it.  Ambulation/Gait Ambulation/Gait assistance: Min guard Gait Distance (Feet): 48 Feet Assistive device: Rolling walker (2 wheeled) Gait Pattern/deviations: Step-to pattern Gait velocity: 0.44m/s   General Gait Details: 3-point step-to gait, very SOB and limited; stops to rest standing q 27ft.  Stairs            Wheelchair Mobility    Modified Rankin (Stroke Patients Only)       Balance Overall balance assessment: Needs assistance;Modified Independent;History of Falls Sitting-balance support: No upper extremity supported;Feet supported Sitting balance-Leahy Scale: Good     Standing balance support: Single extremity supported;During functional activity Standing balance-Leahy Scale: Good Standing balance comment: mildly impulsive, not following safety recommenations in room when cued.                             Pertinent Vitals/Pain Pain Assessment: 0-10 Pain Score: 2  Pain Location: BLE Pain Intervention(s): Limited activity within patient's tolerance;Monitored during session    [place] expects to be discharged to:: Private residence Living Arrangements: Spouse/significant other Available Help at Discharge: Family Type of Home: House Home Access: Stairs to enter;Ramped entrance     Home Layout: One level Home Equipment: Environmental consultant - 2 wheels;Cane - single point      Prior Function Level of Independence: Independent with assistive device(s);Needs  assistance   Gait / Transfers Assistance Needed: household AMB with SPC, very unsteady and easily SOB. Several stumbles in past 6 months but no falls. Pt has extensive falls history for >2 years  ADL's / Homemaking Assistance Needed: additional time and  effort required to perform self care/sponge baths        Hand Dominance        Extremity/Trunk Assessment   Upper Extremity Assessment Upper Extremity Assessment: Generalized weakness    Lower Extremity Assessment Lower Extremity Assessment: Generalized weakness       Communication      Cognition Arousal/Alertness: Awake/alert Behavior During Therapy: WFL for tasks assessed/performed;Anxious Overall Cognitive Status: Within Functional Limits for tasks assessed                                        General Comments      Exercises     Assessment/Plan    PT Assessment Patient needs continued PT services  PT Problem List Decreased strength;Decreased range of motion;Decreased activity tolerance;Decreased balance;Decreased mobility       PT Treatment Interventions DME instruction;Balance training;Gait training;Stair training;Functional mobility training;Therapeutic activities;Therapeutic exercise;Manual techniques;Wheelchair mobility training;Patient/family education;Cognitive remediation    PT Goals (Current goals can be found in the Care Plan section)  Acute Rehab PT Goals Patient Stated Goal: regain strength and activity tolerance PT Goal Formulation: With patient Time For Goal Achievement: 09/04/19 Potential to Achieve Goals: Fair    Frequency Min 2X/week   Barriers to discharge        Co-evaluation               AM-PAC PT "6 Clicks" Mobility  Outcome Measure Help needed turning from your back to your side while in a flat bed without using bedrails?: A Little Help needed moving from lying on your back to sitting on the side of a flat bed without using bedrails?: A Little Help needed moving to and from a bed to a chair (including a wheelchair)?: A Little Help needed standing up from a chair using your arms (e.g., wheelchair or bedside chair)?: A Little Help needed to walk in hospital room?: A Little Help needed climbing 3-5 steps  with a railing? : A Little 6 Click Score: 18    End of Session Equipment Utilized During Treatment: Gait belt Activity Tolerance: Patient tolerated treatment well;No increased pain;Patient limited by fatigue Patient left: in chair;with call bell/phone within reach;with chair alarm set(woundcare nurse in room) Nurse Communication: Mobility status PT Visit Diagnosis: Unsteadiness on feet (R26.81);Other abnormalities of gait and mobility (R26.89);Difficulty in walking, not elsewhere classified (R26.2);Muscle weakness (generalized) (M62.81)    Time: [id number] PT Time Calculation (min) (ACUTE ONLY): 14 min   Charges:   PT Evaluation $PT Eval Moderate Complexity: 1 Mod     2:14 PM, 08/21/19 Etta Grandchild, PT, DPT Physical Therapist - [facility]  [phone] ([place])    [place] C 08/21/2019, 2:11 PM

## 2019-08-21 NOTE — Discharge Instructions (Addendum)
Vascular Surgery Discharge Instructions 1) Elevate you legs heart level or higher as much as you can. This includes during the day when you are sitting or laying down. 2) Use your lymphedema pump at least twice a day while you are elevated your legs.   HHRN--Dressing procedure/placement/frequency:Legs cleansed with soap and water and pat dry.  Wrapped with zinc/calamine layer and secured with self adherent coban.  WIll change twice weekly. Discharge pending.

## 2019-08-21 NOTE — Consult Note (Signed)
[place] Nurse wound consult note Reason for Consult: Unna boots to bilateral lower legs.  Vascular service is at bedside and agrees to proceed with UNna boots at this time.  Is on Vancocin for cellulitis Wound type:infectious with lymphedema Pressure Injury POA: NA Measurement: 5 cm x 5 cm crusted scabbed lesions to bilateral lower legs.  No drainage at this time.  Wound bed: scabbed Drainage (amount, consistency, odor) none Periwound: edema and erythema.  Has lymphedema pumps at home and does not use them.   Dressing procedure/placement/frequency:Legs cleansed with soap and water and pat dry.  Wrapped with zinc/calamine layer and secured with self adherent coban.  WIll change twice weekly. Discharge pending.  Will not follow at this time.  Please re-consult if needed.  Domenic Moras MSN, RN, FNP-BC CWON Wound, Ostomy, Continence Nurse Pager [phone]

## 2019-08-21 NOTE — Progress Notes (Signed)
Vascular and Hospitalist notified: Will this patient require Unno boots at some point? Dr. Posey Pronto are you ok with placing a PT order in for this patient.

## 2019-08-21 NOTE — Discharge Summary (Addendum)
[facility] [place] NAME: Gary [name]    MR#:  [id number]  DATE OF BIRTH:  04-11-1946  DATE OF ADMISSION:  08/18/2019 ADMITTING PHYSICIAN: Orene Desanctis, DO  DATE OF DISCHARGE: 08/21/2019  PRIMARY CARE PHYSICIAN: [name], MD    ADMISSION DIAGNOSIS:  Edema [R60.9] Lower extremity pain, bilateral [M79.604, M79.605] Cellulitis of lower extremity, unspecified laterality [L03.119] Cellulitis [L03.90]  DISCHARGE DIAGNOSIS:  bilateral lower extremity cellulitis acute on chronic bilateral lower extremity lymphedema chronic chronic kidney disease stage III secondary to diabetes and hypertension Dm-1 on Insulin pump SECONDARY DIAGNOSIS:   Past Medical History:  Diagnosis Date  . Depression   . 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 Gastroparesis   . Graves disease   . Hypercholesterolemia   . Hypertension   . Mantle cell lymphoma ([place]) 07/2014  . Peripheral neuropathy     HOSPITAL COURSE:   Gary [name] a 73 y.o.malewith medical history significant ofChronic systolic heart failure(EF of 55-65% in 11/2017), bilateral carotid artery stenosis, CAD, hypertension, Graves' disease, Type 1diabetes with gastroparesis, CKD stage III, mantle cell lymphoma for worsening bilateral lower extremity cellulitis. Patient has been following with vascular surgery outpatient for bilateral Unna wrap and management of his lower extremity lymphedema.  *Bilateral lower extremity cellulitis-acute on chronic with h/o Chornic Lymphedema - Left LE appears more edematous than right - Left venous DVT ultrasound negative  -IV Vancomycin and Rocephin --change to oral keflex - wound care per RN-- seen by RN and [name] boot has been wrapped today and follow dressing instructions per wound nurse at home by home health RN -vascular consult with Dr brabham/Dr.Dew appreciated  -patient will follow vascular  surgery as outpatient. He is recommended to elevate his leg as much possible and use lymphedema pump is often -patient tells me it is very tedious to put the pump on and off. There are some issues with noncompliance.  * Acute on chronic CHF systolic with significant leg edema - suspect worsening systolic heart failure and body habitus from morbid obesity  - last echo in 11/2017 with EF of 55-65% -echo in [place] 2019 (as out pt) showed EF 35% - daily IV 40mg  Lasix bid--d/cIV  lasix. Continue home dose Lasix. A little bump and creatinine suspected over diuresis. No respiratory distress. - check Intake and output - continue low dose lasix, spironolactone and Coreg.  -Holding lisinopril  *Type 1 Diabetes  - continue insulin pump settings per endo and check ACHS and mealtime - Last HbA1C of 6.3  *Chronic kidney disease stage 3 with Diabetic Nephropathy - baseline creat ranges from 1.5-2.0 - avoid nephrotoxic agent  -creat bump a bit due to over diureses -patient will see Dr. Juleen China as outpatient. His office will set up appointment. Pt aware  *Mantle cell lymphoma s/p chemo -stable. Follows with Dr [name]  *Hyperlipidemia - continue pravastatin  *DVT prophylaxis -Lovenox  Patient will follow-up with Dr. [name] in a week to 10 days. Patient will need metabolic panel be checked with PCP's appointment. Home health nursing will be resumed.  Discharge later to home. Patient agreeable.  CONSULTS OBTAINED:    DRUG ALLERGIES:   Allergies  Allergen Reactions  . Rofecoxib Nausea Only    DISCHARGE MEDICATIONS:   Allergies as of 08/21/2019      Reactions   Rofecoxib Nausea Only      Medication List    STOP taking these medications  amoxicillin-clavulanate 875-125 MG tablet Commonly known as: Augmentin   lisinopril 20 MG tablet Commonly known as: ZESTRIL     TAKE these medications   Accu-Chek Aviva Plus test strip Generic drug: glucose blood USE AS  INSTRUCTED. USE 1 STRIP EVERY 3 HOURS . ACCU-CHECK AVIVA PLUS TEST STRIP. DX X44.818   Precision QID Test test strip Generic drug: glucose blood Use 8 (eight) times daily as directed. BAYER CONTOUR NEXT TEST STRIPS E10.649   acetaminophen 500 MG tablet Commonly known as: TYLENOL Take 500 mg by mouth every 6 (six) hours as needed.   carvedilol 3.125 MG tablet Commonly known as: COREG Take 3.125 mg by mouth 2 (two) times daily with a meal.   cephALEXin 250 MG capsule Commonly known as: KEFLEX Take 1 capsule (250 mg total) by mouth 2 (two) times daily.   cyanocobalamin 1000 MCG/ML injection Commonly known as: (VITAMIN B-12) Inject into the muscle.   ferrous sulfate 325 (65 FE) MG tablet Take 325 mg by mouth daily.   furosemide 20 MG tablet Commonly known as: LASIX Take 20 mg by mouth daily.   GlucaGen Diagnostic 1 MG injection Generic drug: glucagon (human recombinant) Use as directed   Glucagon Emergency 1 MG injection Generic drug: glucagon   glucose 4 GM chewable tablet Chew 1 tablet by mouth once as needed for low blood sugar.   insulin aspart 100 UNIT/ML injection Commonly known as: NovoLOG Basal rate 12am 0.8 units per hour, 9am 0.9 units per hour. (total basal insulin 20.7 units). Carbohydrate ratio 1 units for every 8gm of carbohydrate. Correction factor 1 units for every 50mg /dl over target cbg. Target CBG 80-120   levothyroxine 25 MCG tablet Commonly known as: SYNTHROID Take by mouth.   multivitamin tablet Take 1 tablet by mouth daily.   pravastatin 40 MG tablet Commonly known as: PRAVACHOL Take 40 mg by mouth daily.   prochlorperazine 10 MG tablet Commonly known as: COMPAZINE TAKE 1 TABLET (10 MG TOTAL) BY MOUTH EVERY 6 (SIX) HOURS AS NEEDED (NAUSEA OR VOMITING).   spironolactone 25 MG tablet Commonly known as: ALDACTONE Take 25 mg by mouth daily.       If you experience worsening of your admission symptoms, develop shortness of breath, life  threatening emergency, suicidal or homicidal thoughts you must seek medical attention immediately by calling 911 or calling your MD immediately  if symptoms less severe.  You Must read complete instructions/literature along with all the possible adverse reactions/side effects for all the Medicines you take and that have been prescribed to you. Take any new Medicines after you have completely understood and accept all the possible adverse reactions/side effects.   Please note  You were cared for by a hospitalist during your hospital stay. If you have any questions about your discharge medications or the care you received while you were in the hospital after you are discharged, you can call the unit and asked to speak with the hospitalist on call if the hospitalist that took care of you is not available. Once you are discharged, your primary care physician will handle any further medical issues. Please note that NO REFILLS for any discharge medications will be authorized once you are discharged, as it is imperative that you return to your primary care physician (or establish a relationship with a primary care physician if you do not have one) for your aftercare needs so that they can reassess your need for medications and monitor your lab values. Today   SUBJECTIVE  I  feel okay. No new complaints.  VITAL SIGNS:  Blood pressure 119/68, pulse 93, temperature 98.5 F (36.9 C), temperature source Oral, resp. rate 18, height 5\' 10"  (1.778 m), weight 128.8 kg, SpO2 97 %.  I/O:    Intake/Output Summary (Last 24 hours) at 08/21/2019 1249 Last data filed at 08/21/2019 0500 Gross per 24 hour  Intake 720 ml  Output 200 ml  Net 520 ml    PHYSICAL EXAMINATION:  GENERAL:  73 y.o.-year-old patient lying in the bed with no acute distress. Obese EYES: Pupils equal, round, reactive to light and accommodation. No scleral icterus. Extraocular muscles intact.  HEENT: Head atraumatic, normocephalic. Oropharynx  and nasopharynx clear.  NECK:  Supple, no jugular venous distention. No thyroid enlargement, no tenderness.  LUNGS: Normal breath sounds bilaterally, no wheezing, rales,rhonchi or crepitation. No use of accessory muscles of respiration.  CARDIOVASCULAR: S1, S2 normal. No murmurs, rubs, or gallops.  ABDOMEN: Soft, non-tender, non-distended. Bowel sounds present. No organomegaly or mass.  EXTREMITIES:  Pt has UNNA BOOTS placed on 08/21/2019.  NEUROLOGIC: Cranial nerves II through XII are intact. Muscle strength 5/5 in all extremities. Sensation intact. Gait not checked.  PSYCHIATRIC: The patient is alert and oriented x 3.  SKIN: as above  DATA REVIEW:   CBC  Recent Labs  Lab 08/19/19 0331  WBC 8.3  HGB 12.6*  HCT 39.2  PLT 148*    Chemistries  Recent Labs  Lab 08/18/19 1310 08/19/19 0331 08/21/19 0428  NA 135 137  --   K 4.5 4.7  --   CL 105 107  --   CO2 19* 20*  --   GLUCOSE 142* 132*  --   BUN 39* 39*  --   CREATININE 1.99* 1.91* 2.76*  CALCIUM 8.4* 8.1*  --   AST 37  --   --   ALT 39  --   --   ALKPHOS 127*  --   --   BILITOT 0.9  --   --     Microbiology Results   Recent Results (from the past 240 hour(s))  Culture, blood (routine x 2)     Status: None (Preliminary result)   Collection Time: 08/18/19  6:52 PM   Specimen: BLOOD  Result Value Ref Range Status   Specimen Description BLOOD RAC  Final   Special Requests   Final    BOTTLES DRAWN AEROBIC AND ANAEROBIC Blood Culture adequate volume   Culture   Final    NO GROWTH 3 DAYS Performed at [facility], [address] [place], [place] [address]    Report Status PENDING  Incomplete  Culture, blood (routine x 2)     Status: None (Preliminary result)   Collection Time: 08/18/19  6:52 PM   Specimen: BLOOD  Result Value Ref Range Status   Specimen Description BLOOD LUA  Final   Special Requests   Final    BOTTLES DRAWN AEROBIC AND ANAEROBIC Blood Culture adequate volume   Culture   Final     NO GROWTH 3 DAYS Performed at [facility], [address] [place], [place] [address]    Report Status PENDING  Incomplete  SARS CORONAVIRUS 2 (TAT 6-24 HRS) Nasopharyngeal Nasopharyngeal Swab     Status: None   Collection Time: 08/18/19 10:11 PM   Specimen: Nasopharyngeal Swab  Result Value Ref Range Status   SARS Coronavirus 2 NEGATIVE NEGATIVE Final    Comment: (NOTE) SARS-CoV-2 target nucleic acids are NOT DETECTED. The SARS-CoV-2 RNA is  generally detectable in upper and lower respiratory specimens during the acute phase of infection. Negative results do not preclude SARS-CoV-2 infection, do not rule out co-infections with other pathogens, and should not be used as the sole basis for treatment or other patient management decisions. Negative results must be combined with clinical observations, patient history, and epidemiological information. The expected result is Negative. Fact Sheet for Patients: SugarRoll.be Fact Sheet for Healthcare Providers: https://www.woods-mathews.com/ This test is not yet approved or cleared by the [place] FDA and  has been authorized for detection and/or diagnosis of SARS-CoV-2 by FDA under an Emergency Use Authorization (EUA). This EUA will remain  in effect (meaning this test can be used) for the duration of the COVID-19 declaration under Section 56 4(b)(1) of the Act, 21 U.S.C. section 360bbb-3(b)(1), unless the authorization is terminated or revoked sooner. Performed at [facility], [place] [address] [place], [place] [address]     RADIOLOGY:  No results found.   CODE STATUS:     Code Status Orders  (From admission, onward)         Start     Ordered   08/18/19 1925  Full code  Continuous     08/18/19 1925        Code Status History    Date Active Date Inactive Code Status Order ID Comments User Context   12/03/2017 0235 12/11/2017 1855 Full Code [id number]  Lance Coon, MD Inpatient   10/28/2017 1720 10/29/2017 1816 Full Code [id number]  Dustin Flock, MD Inpatient   01/12/2017 0238 01/13/2017 2044 Full Code [id number]  Hugelmeyer, Ubaldo Glassing, DO Inpatient   Advance Care Planning Activity      TOTAL TIME TAKING CARE OF THIS PATIENT: *40* minutes.    Fritzi Mandes M.D on 08/21/2019 at 12:49 PM  Between 7am to 6pm - Pager - [phone] After 6pm go to [email or web address] - password [email or web address]  [facility]    CC: Primary care physician; [name], MD

## 2019-08-22 ENCOUNTER — Encounter (INDEPENDENT_AMBULATORY_CARE_PROVIDER_SITE_OTHER): Payer: Medicare Other

## 2019-08-22 ENCOUNTER — Telehealth: Payer: Self-pay

## 2019-08-22 ENCOUNTER — Other Ambulatory Visit: Payer: Self-pay

## 2019-08-22 NOTE — Telephone Encounter (Signed)
Unable to reach patient for transitional care management. No answer. Will follow as appropriate.  

## 2019-08-23 LAB — CULTURE, BLOOD (ROUTINE X 2)
Culture: NO GROWTH
Culture: NO GROWTH
Special Requests: ADEQUATE
Special Requests: ADEQUATE

## 2019-08-23 NOTE — Telephone Encounter (Signed)
Transition Care Management Follow-up Telephone Call   Date discharged? 08/21/19   How have you been since you were released from the hospital? Alternating between tylenol and aspirin for pain when going to bed. Resting better. Trying to keep feet propped up somewhat.  Monitoring blood sugar.    Do you understand why you were in the hospital? Yes.   Do you understand the discharge instructions? Yes, elevate leg and use lymphedema pump. BMP lab with PCP. Unna boot on 11/13.   Where were you discharged to? Home.   Items Reviewed:  Medications reviewed: Yes, taking without any problems.   Allergies reviewed: None new.  Dietary changes reviewed: Yes, low sodium  Referrals reviewed: Yes, HH declined. Nephrology. Vascular. [facility].    Functional Questionnaire:   Activities of Daily Living (ADLs):   He states they are independent in the following: Bathing, dressing,  States they require assistance with the following: Ambulates with cane/walker.    Any transportation issues/concerns?: None at this time.    Any patient concerns? None at this time.   Confirmed importance and date/time of follow-up visits scheduled Yes, appointment scheduled 08/25/19 @ 11:00. Needs BMP lab.   Provider Appointment booked with Dr. Nicki Reaper, pcp.   Confirmed with patient if condition begins to worsen call PCP or go to the ER.  Patient was given the office number and encouraged to call back with question or concerns.  : Yes.

## 2019-08-25 ENCOUNTER — Ambulatory Visit (INDEPENDENT_AMBULATORY_CARE_PROVIDER_SITE_OTHER): Payer: Medicare Other | Admitting: Nurse Practitioner

## 2019-08-25 ENCOUNTER — Other Ambulatory Visit: Payer: Self-pay | Admitting: Internal Medicine

## 2019-08-25 ENCOUNTER — Other Ambulatory Visit: Payer: Self-pay

## 2019-08-25 ENCOUNTER — Other Ambulatory Visit
Admission: RE | Admit: 2019-08-25 | Discharge: 2019-08-25 | Disposition: A | Discharge status: Home / Self Care | Payer: Medicare Other | Source: Ambulatory Visit | Attending: Internal Medicine | Admitting: Internal Medicine

## 2019-08-25 ENCOUNTER — Other Ambulatory Visit: Payer: Self-pay | Admitting: *Deleted

## 2019-08-25 ENCOUNTER — Ambulatory Visit (INDEPENDENT_AMBULATORY_CARE_PROVIDER_SITE_OTHER): Payer: Medicare Other | Admitting: Internal Medicine

## 2019-08-25 VITALS — BP 136/71 | HR 62 | Resp 16 | Ht 70.0 in | Wt 276.0 lb

## 2019-08-25 DIAGNOSIS — L97211 Non-pressure chronic ulcer of right calf limited to breakdown of skin: Secondary | ICD-10-CM | POA: Diagnosis not present

## 2019-08-25 DIAGNOSIS — I428 Other cardiomyopathies: Secondary | ICD-10-CM

## 2019-08-25 DIAGNOSIS — L97221 Non-pressure chronic ulcer of left calf limited to breakdown of skin: Secondary | ICD-10-CM | POA: Diagnosis not present

## 2019-08-25 DIAGNOSIS — E1151 Type 2 diabetes mellitus with diabetic peripheral angiopathy without gangrene: Secondary | ICD-10-CM

## 2019-08-25 DIAGNOSIS — C8318 Mantle cell lymphoma, lymph nodes of multiple sites: Secondary | ICD-10-CM

## 2019-08-25 DIAGNOSIS — L97201 Non-pressure chronic ulcer of unspecified calf limited to breakdown of skin: Secondary | ICD-10-CM

## 2019-08-25 DIAGNOSIS — I10 Essential (primary) hypertension: Secondary | ICD-10-CM | POA: Diagnosis not present

## 2019-08-25 DIAGNOSIS — I5023 Acute on chronic systolic (congestive) heart failure: Secondary | ICD-10-CM

## 2019-08-25 DIAGNOSIS — L03119 Cellulitis of unspecified part of limb: Secondary | ICD-10-CM | POA: Diagnosis not present

## 2019-08-25 DIAGNOSIS — I429 Cardiomyopathy, unspecified: Secondary | ICD-10-CM

## 2019-08-25 DIAGNOSIS — E875 Hyperkalemia: Secondary | ICD-10-CM | POA: Diagnosis not present

## 2019-08-25 DIAGNOSIS — D649 Anemia, unspecified: Secondary | ICD-10-CM

## 2019-08-25 DIAGNOSIS — N1831 Chronic kidney disease, stage 3a: Secondary | ICD-10-CM

## 2019-08-25 LAB — BASIC METABOLIC PANEL
BUN: 40 mg/dL — ABNORMAL HIGH (ref 6–23)
CO2: 25 mEq/L (ref 19–32)
Calcium: 8.6 mg/dL (ref 8.4–10.5)
Chloride: 107 mEq/L (ref 96–112)
Creatinine, Ser: 1.68 mg/dL — ABNORMAL HIGH (ref 0.40–1.50)
GFR: 40.46 mL/min — ABNORMAL LOW (ref >60.00)
Glucose, Bld: 106 mg/dL — ABNORMAL HIGH (ref 70–99)
Potassium: 5.5 mEq/L — ABNORMAL HIGH (ref 3.5–5.1)
Sodium: 139 mEq/L (ref 135–145)

## 2019-08-25 LAB — HEPATIC FUNCTION PANEL
ALT: 17 U/L (ref 0–53)
AST: 16 U/L (ref 0–37)
Albumin: 3.7 g/dL (ref 3.5–5.2)
Alkaline Phosphatase: 118 U/L — ABNORMAL HIGH (ref 39–117)
Bilirubin, Direct: 0.1 mg/dL (ref 0.0–0.3)
Total Bilirubin: 0.5 mg/dL (ref 0.2–1.2)
Total Protein: 6.5 g/dL (ref 6.0–8.3)

## 2019-08-25 LAB — CBC WITH DIFFERENTIAL/PLATELET
Basophils Absolute: 0.1 10*3/uL (ref 0.0–0.1)
Basophils Relative: 0.9 % (ref 0.0–3.0)
Eosinophils Absolute: 0.1 10*3/uL (ref 0.0–0.7)
Eosinophils Relative: 1.8 % (ref 0.0–5.0)
HCT: 39.1 % (ref 39.0–52.0)
Hemoglobin: 13 g/dL (ref 13.0–17.0)
Lymphocytes Relative: 9.6 % — ABNORMAL LOW (ref 12.0–46.0)
Lymphs Abs: 0.7 10*3/uL (ref 0.7–4.0)
MCHC: 33.2 g/dL (ref 30.0–36.0)
MCV: 96.6 fl (ref 78.0–100.0)
Monocytes Absolute: 0.5 10*3/uL (ref 0.1–1.0)
Monocytes Relative: 6.4 % (ref 3.0–12.0)
Neutro Abs: 5.9 10*3/uL (ref 1.4–7.7)
Neutrophils Relative %: 81.3 % — ABNORMAL HIGH (ref 43.0–77.0)
Platelets: 221 10*3/uL (ref 150.0–400.0)
RBC: 4.04 Mil/uL — ABNORMAL LOW (ref 4.22–5.81)
RDW: 14.2 % (ref 11.5–15.5)
WBC: 7.3 10*3/uL (ref 4.0–10.5)

## 2019-08-25 LAB — TSH: TSH: 2.09 u[IU]/mL (ref 0.35–4.50)

## 2019-08-25 LAB — HEMOGLOBIN A1C: Hgb A1c MFr Bld: 6.8 % — ABNORMAL HIGH (ref 4.6–6.5)

## 2019-08-25 LAB — POTASSIUM: Potassium: 5.4 mmol/L — ABNORMAL HIGH (ref 3.5–5.1)

## 2019-08-25 NOTE — Progress Notes (Signed)
Order placed for f/u stat potassium.  

## 2019-08-25 NOTE — Progress Notes (Signed)
History of Present Illness  There is no documented history at this time  Assessments & Plan   There are no diagnoses linked to this encounter.    Additional instructions  Subjective:  Patient presents with venous ulcer of the Bilateral lower extremity.    Procedure:  3 layer unna wrap was placed Bilateral lower extremity.   Plan:   Follow up in one week.  

## 2019-08-25 NOTE — Patient Outreach (Addendum)
[facility]) Care Management  08/25/2019  Gary [name] 20-Feb-1946 [id number]   EMMI- general d/c D/c from [place] [facility] # 1 Date: 08/24/19 1408 Red Alert Reason: Got discharge papers? No Other questions/problems? Yes  Insurance: NextGen Medicare  [facility] admissions x  1ED visits x 1 in the last 6 months   Transition of care services noted to be completed by primary care MD office staff on 08/21/19- Dr [name]- [facility] [place] will be completed by primary care provider office who will refer to [facility] care management if needed.  Outreach attempt # 1 unsuccessful  His wife Enid Derry answered.She reported he was no at home and available. Pt with MD appointment today. [facility] RN CM noted increased volume of noise in background. Mrs Halterman requested CM to hold to reduce the noise she reported she was not able to hear CM clearly [facility] RN CM explained that CM would like to follow up with patient after his EMMI call on 08/24/19  [facility] RN CM left HIPAA compliant voicemail message along with CM's contact info and office availability.     EMMI: pending  DME: lymphedema pump, Unna boot, cane, walker   Appointments 08/25/19 11 am [name] HFU 08/29/19 [place] 4 week check vascular 08/30/19 0830 & 0945 1 week vascula f/u [name] [id number] unna boot from RN 08/31/19 1100 new HF [place] 09/25/19 1315 podiatry [name] 11/15/19 0930 [name] 4 month f/u and visit wit Dr Delight Hoh at 1045 for CT results, BF cardiology medonc 01/24/19 0900 labs   Consent: [facility] RN CM reviewed [facility] services with patient. Patient gave verbal consent for services [facility] telephonic RN CM.   Advised patient that there will be further automated EMMI- post discharge calls to assess how the patient is doing following the recent hospitalization Advised the patient that another call may be received from a nurse if  any of their responses were abnormal. Patient voiced understanding and was appreciative of f/u call.   Plan: [facility] RN CM sent an unsuccessful outreach letter and scheduled this patient for another call attempt within 4 business days    Kimberly L. Lavina Hamman, RN, BSN, [place] Coordinator Office number [phone] Mobile number [phone]  Main [facility] number [phone] Fax number [phone]

## 2019-08-25 NOTE — Progress Notes (Signed)
Patient ID: Gary FLORENDO, male   DOB: 08/22/1946, 73 y.o.   MRN: [id number]   Subjective:    [name], male    DOB: 10-Jun-1946, 73 y.o.   MRN: [id number]  HPI  Patient here for hospital follow up.  Was admitted from 08/18/19 - 08/21/19 with bilateral lower extremity cellulitis with history of chronic lymphedema.  Left lower extremity ultrasound negative for DVT.  Was started on IV vancomycin and rocephin.  Was changed to keflex prior to discharge.  Unna boot.  Discharged to f/u with vascular surgery.  Also was diagnosed with acute on chronic CHF.  Given IV lasix with bump in creatinine.  He was continued on lasix, spironolactone and coreg.  Lisinopril was held.  Recommended to f/u with cardiology and nephrology as outpatient.  Since discharge things are relatively stable.  Still with sob with exertion, but states this is stable over the last several months.  No chest pain. Eating.  No vomiting.  No diarrhea.  Saw vascular surgery today.  Had legs wrapped.  Reports - better.  Redness improved.  No fever.     Past Medical History:  Diagnosis Date  . Depression   . 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 Gastroparesis   . Graves disease   . Hypercholesterolemia   . Hypertension   . Mantle cell lymphoma ([place]) 07/2014  . Peripheral neuropathy    Past Surgical History:  Procedure Laterality Date  . CHOLECYSTECTOMY    . NEPHRECTOMY     right  . PACEMAKER INSERTION N/A 12/07/2017   Procedure: INSERTION PACEMAKER DUAL CHAMBER INITIAL INSERT;  Surgeon: Isaias Cowman, MD;  Location: [facility] ORS;  Service: Cardiovascular;  Laterality: N/A;  . PORTA CATH INSERTION N/A 11/03/2017   Procedure: PORTA CATH INSERTION;  Surgeon: Katha Cabal, MD;  Location: [facility];  Service: Cardiovascular;  Laterality: N/A;   Family History  Problem Relation Age of Onset  . Breast cancer Mother   . Diabetes Father   . Cancer  Father        Lymphoma  . Alcohol abuse Maternal Uncle   . Diabetes Sister   . Heart disease Other        grandfather   Social History   Socioeconomic History  . Marital status: Married    Spouse name: Not on file  . Number of children: 1  . Years of education: Not on file  . Highest education level: Not on file  Occupational History  . Not on file  Social Needs  . Financial resource strain: Patient refused  . Food insecurity    Worry: Patient refused    Inability: Patient refused  . Transportation needs    Medical: No    Non-medical: No  Tobacco Use  . Smoking status: Former Research scientist (life sciences)  . Smokeless tobacco: Current User    Types: Chew  Substance and Sexual Activity  . Alcohol use: Yes    Alcohol/week: 3.0 standard drinks    Types: 3 Cans of beer per week    Comment: nightly  . Drug use: Yes    Types: Codeine  . Sexual activity: Not Currently  Lifestyle  . Physical activity    Days per week: 0 days    Minutes per session: 0 min  . Stress: Not at all  Relationships  . Social connections    Talks on phone: Patient refused    Gets together: Patient refused  Attends religious service: Patient refused    Active member of club or organization: Patient refused    Attends meetings of clubs or organizations: Patient refused    Relationship status: Patient refused  Other Topics Concern  . Not on file  Social History Narrative  . Not on file    Outpatient Encounter Medications as of 08/25/2019  Medication Sig  . ACCU-CHEK AVIVA PLUS test strip USE AS INSTRUCTED. USE 1 STRIP EVERY 3 HOURS . ACCU-CHECK AVIVA PLUS TEST STRIP. DX S50.539  . acetaminophen (TYLENOL) 500 MG tablet Take 500 mg by mouth every 6 (six) hours as needed.   . cephALEXin (KEFLEX) 250 MG capsule Take 1 capsule (250 mg total) by mouth 2 (two) times daily.  . cyanocobalamin (,VITAMIN B-12,) 1000 MCG/ML injection Inject into the muscle.  . ferrous sulfate 325 (65 FE) MG tablet Take 325 mg by mouth  daily.  . furosemide (LASIX) 20 MG tablet Take 20 mg by mouth daily.  Marland Kitchen GLUCAGON EMERGENCY 1 MG injection   . glucagon, human recombinant, (GLUCAGEN DIAGNOSTIC) 1 MG injection Use as directed  . glucose 4 GM chewable tablet Chew 1 tablet by mouth once as needed for low blood sugar.  Marland Kitchen glucose blood (PRECISION QID TEST) test strip Use 8 (eight) times daily as directed. BAYER CONTOUR NEXT TEST STRIPS E10.649  . insulin aspart (NOVOLOG) 100 UNIT/ML injection Basal rate 12am 0.8 units per hour, 9am 0.9 units per hour. (total basal insulin 20.7 units). Carbohydrate ratio 1 units for every 8gm of carbohydrate. Correction factor 1 units for every 24m/dl over target cbg. Target CBG 80-120  . levothyroxine (SYNTHROID) 25 MCG tablet Take by mouth.  . Multiple Vitamin (MULTIVITAMIN) tablet Take 1 tablet by mouth daily.  . pravastatin (PRAVACHOL) 40 MG tablet Take 40 mg by mouth daily.   . prochlorperazine (COMPAZINE) 10 MG tablet TAKE 1 TABLET (10 MG TOTAL) BY MOUTH EVERY 6 (SIX) HOURS AS NEEDED (NAUSEA OR VOMITING).  .Marland Kitchenspironolactone (ALDACTONE) 25 MG tablet Take 25 mg by mouth daily.  . carvedilol (COREG) 3.125 MG tablet Take 3.125 mg by mouth 2 (two) times daily with a meal.    Facility-Administered Encounter Medications as of 08/25/2019  Medication  . heparin lock flush 100 unit/mL  . sodium chloride flush (NS) 0.9 % injection 10 mL  . sodium chloride flush (NS) 0.9 % injection 10 mL   Review of Systems  Constitutional: Negative for appetite change and fever.  HENT: Negative for congestion and sinus pressure.   Respiratory: Positive for shortness of breath. Negative for cough and chest tightness.   Cardiovascular: Positive for leg swelling. Negative for chest pain and palpitations.  Gastrointestinal: Negative for abdominal pain, diarrhea, nausea and vomiting.  Genitourinary: Negative for difficulty urinating and dysuria.  Musculoskeletal: Negative for joint swelling and myalgias.  Skin:  Negative for color change and rash.  Neurological: Negative for dizziness, light-headedness and headaches.  Psychiatric/Behavioral: Negative for agitation and dysphoric mood.       Objective:    Physical Exam Constitutional:      General: He is not in acute distress.    Appearance: Normal appearance. He is well-developed.  HENT:     Head: Normocephalic and atraumatic.  Neck:     Musculoskeletal: Neck supple. No muscular tenderness.  Cardiovascular:     Rate and Rhythm: Normal rate and regular rhythm.  Pulmonary:     Effort: Pulmonary effort is normal. No respiratory distress.     Breath sounds: Normal breath sounds.  Abdominal:     General: Bowel sounds are normal.     Palpations: Abdomen is soft.     Tenderness: There is no abdominal tenderness.  Musculoskeletal:     Comments: Legs wrapped.  Decreased swelling.    Lymphadenopathy:     Cervical: No cervical adenopathy.  Neurological:     Mental Status: He is alert and oriented to person, place, and time.  Psychiatric:        Mood and Affect: Mood normal.        Behavior: Behavior normal.     BP 138/70   Pulse 100   Temp (!) 96.7 F (35.9 C)   Resp 16   Wt 276 lb (125.2 kg)   SpO2 99%   BMI 39.60 kg/m  Wt Readings from Last 3 Encounters:  08/25/19 276 lb (125.2 kg)  08/25/19 276 lb (125.2 kg)  08/21/19 284 lb (128.8 kg)     Lab Results  Component Value Date   WBC 7.3 08/25/2019   HGB 13.0 08/25/2019   HCT 39.1 08/25/2019   PLT 221.0 08/25/2019   GLUCOSE 106 (H) 08/25/2019   CHOL 197 08/09/2015   TRIG 51.0 08/09/2015   HDL 73.60 08/09/2015   LDLCALC 113 (H) 08/09/2015   ALT 17 08/25/2019   AST 16 08/25/2019   NA 139 08/25/2019   K 5.4 (H) 08/25/2019   CL 107 08/25/2019   CREATININE 1.68 (H) 08/25/2019   BUN 40 (H) 08/25/2019   CO2 25 08/25/2019   TSH 2.09 08/25/2019   INR 1.12 07/14/2018   HGBA1C 6.8 (H) 08/25/2019   MICROALBUR 1.9 09/22/2016       Assessment & Plan:   Problem List Items  Addressed This Visit    Acute on chronic systolic CHF (congestive heart failure) ([place])    Was diuresed in hospital.  Slight increased in creatinine.  Was discharged to continue on lasix, spironolactone and coreg. Lisinopril being held.  Recheck metabolic panel.  Schedule f/u with cardiology.  Persistent sob with exertion.        Anemia    Follow cbc.  Is followed by oncology.        Relevant Orders   CBC with Differential/Platelet (Completed)   Cardiomyopathy, idiopathic ([place])   Relevant Orders   Hepatic function panel (Completed)   Cellulitis    Admitted with bilateral lower extremity cellulitis.  Treated with IV abx in hospital.  Discharged and continues on keflex.  Was seen by vascular surgery today.  Legs (per pt) improved.  Wrapped.  Continue f/u with vascular surgery.       CKD (chronic kidney disease) stage 3, GFR 30-59 ml/min    Has been followed by nephrology.  Was diuresed in the hospital.  Recheck met b today.        Diabetes ([place])   Relevant Orders   Hemoglobin A1c (Completed)   Essential hypertension, benign    Blood pressure under good control.  Continue same medication regimen.  Follow pressures.  Follow metabolic panel.        Relevant Orders   TSH (Completed)   Basic metabolic panel (today) (Completed)   Mantle cell lymphoma ([place])    Followed by oncology.           [name], MD

## 2019-08-27 ENCOUNTER — Encounter (INDEPENDENT_AMBULATORY_CARE_PROVIDER_SITE_OTHER): Payer: Self-pay | Admitting: Nurse Practitioner

## 2019-08-27 ENCOUNTER — Encounter: Payer: Self-pay | Admitting: Internal Medicine

## 2019-08-27 NOTE — Assessment & Plan Note (Signed)
Admitted with bilateral lower extremity cellulitis.  Treated with IV abx in hospital.  Discharged and continues on keflex.  Was seen by vascular surgery today.  Legs (per pt) improved.  Wrapped.  Continue f/u with vascular surgery.

## 2019-08-27 NOTE — Assessment & Plan Note (Signed)
Blood pressure under good control.  Continue same medication regimen.  Follow pressures.  Follow metabolic panel.   

## 2019-08-27 NOTE — Assessment & Plan Note (Signed)
Was diuresed in hospital.  Slight increased in creatinine.  Was discharged to continue on lasix, spironolactone and coreg. Lisinopril being held.  Recheck metabolic panel.  Schedule f/u with cardiology.  Persistent sob with exertion.

## 2019-08-27 NOTE — Assessment & Plan Note (Signed)
Followed by oncology 

## 2019-08-27 NOTE — Assessment & Plan Note (Signed)
Has been followed by nephrology.  Was diuresed in the hospital.  Recheck met b today.

## 2019-08-27 NOTE — Assessment & Plan Note (Signed)
Follow cbc.  Is followed by oncology.

## 2019-08-28 ENCOUNTER — Other Ambulatory Visit (INDEPENDENT_AMBULATORY_CARE_PROVIDER_SITE_OTHER): Payer: Self-pay | Admitting: Vascular Surgery

## 2019-08-28 ENCOUNTER — Other Ambulatory Visit: Payer: Self-pay | Admitting: *Deleted

## 2019-08-28 ENCOUNTER — Other Ambulatory Visit: Payer: Self-pay | Admitting: Internal Medicine

## 2019-08-28 DIAGNOSIS — E875 Hyperkalemia: Secondary | ICD-10-CM

## 2019-08-28 DIAGNOSIS — L97919 Non-pressure chronic ulcer of unspecified part of right lower leg with unspecified severity: Secondary | ICD-10-CM

## 2019-08-28 DIAGNOSIS — L97929 Non-pressure chronic ulcer of unspecified part of left lower leg with unspecified severity: Secondary | ICD-10-CM

## 2019-08-28 DIAGNOSIS — N1832 Chronic kidney disease, stage 3b: Secondary | ICD-10-CM

## 2019-08-28 NOTE — Patient Outreach (Addendum)
[facility]) Care Management  08/28/2019  Gary [name] May 18, 1946 [id number]   EMMI- general d/c D/c from [place] Day # 1 Date: 08/24/19 1408 Red Alert Reason: Got discharge papers? No Other questions/problems? Yes  Insurance: NextGen Medicare and A[employer]na NAP  [facility] admissions x  1 ED visits x 1 in the last 6 months   Transition of care services noted to be completed by primary care MD office staff on 08/21/19- Gary [name]- [facility] will be completed by primary care provider office who will refer to [name] care management if needed.  Outreach attempt # 2 successful to the home number  Gary [name] is able to verify HIPAA (DOB, address)  [facility] RN Cm notes background noise. Gary [name] states he has to put others on speaker phone He denies issues with hearing but reports difficulty with the sound on his phone and incidents of disconnecting from others if he puts the phone to his ear  With review of his discharge instructions and appointments Gary [name] discussed the difficulty in walking from his car to his appointment destination related to sob on exertion.  [facility] RN CM discussed the purpose and importance of the Heart failure clinic related to the swelling of his legs and cellulitis   Diabetes Reports he took his cbg at around 5 am an value was in the 300s and reports at the time of the call it was 138 He tells [facility] RN CM he checks his cbg "a lot" he  EMMI: Gary [name] reports the EMMI answers for 08/24/19 are incorrect  Gary [name] reports having his discharge instructions/discharge papers and denies questions/problems at this time. He reports he visited with his primary MD on 08/25/19 without concerns  Gary [name] discussed being hospitalized for cellulitis. [facility] RN CM discussed CHF resulting in Cellulitis Home treatment voiced by Gary [name] includes wrapping of his legs and taking his medicine. [facility] RN CM  discussed with him low sodium intake and elevation of his legs. He reports he is not elevating his legs "as much as I should" He was encouraged to increase times of left elevation.  Conditions:  Systolic CHF (EF of 25-42% in 11/2017), , bilateral lower extremity cellulitis, HTN, CAD, bilateral carotid artery stenosis, PVC, GERD,  Thyroid disease, Diabetes type 1 (last HgA1c 6.3) , graves, disease, peripheral neuropathy, Saturday night paralysis, humerus fx, onychomycosis of toenails of both feet with pain, calf ulcer, hx of chest pain, CKD, hx of fall, mantle cell lymphoma, depression, Graves disease, HLD,    DME: lymphedema pump, Unna boot, cane, walker, accu chek aviva plus, insulin pump  Appointments Seen by Gary [name] Primary MD on 08/25/19 for HFU 08/30/19 0830 & 0945 1 week vascula f/u Gary [name] [facility] unna boot from RN 08/31/19 1100 new HF [name] 09/25/19 1315 podiatry Gary [name] 11/15/19 0930 Gary [name] 4 month f/u and visit wit Gary [name] at 1045 for CT results, Gary [name] 01/24/19 0900 labs   Consent: [facility] RN CM reviewed [facility] services with patient. Patient gave verbal consent for services [facility] telephonic RN CM.   Advised patient that there will be further automated EMMI-post discharge calls to assess how the patient is doing following the recent hospitalization Advised the patient that another call may be received from a nurse if any of their responses were abnormal. Patient voiced understanding and was appreciative of f/u call.   Plan:  [facility] RN CM scheduled this patient for another call attempt within 7-10 business days  Pt encouraged to return a call to [place] CM prn  [facility] RN CM discussed the outreach letter already sent on with [facility] brochure enclosed for review  Routed note to MDs/NP/PA   [place]. Gary Hamman, RN, BSN, [place] Coordinator Office number [phone] Mobile number [phone]  Main [facility]  number [phone] Fax number [phone]

## 2019-08-28 NOTE — Progress Notes (Signed)
Order placed for f/u met b (stat)

## 2019-08-29 ENCOUNTER — Ambulatory Visit (INDEPENDENT_AMBULATORY_CARE_PROVIDER_SITE_OTHER): Payer: Medicare Other | Admitting: Nurse Practitioner

## 2019-08-29 ENCOUNTER — Other Ambulatory Visit
Admission: RE | Admit: 2019-08-29 | Discharge: 2019-08-29 | Disposition: A | Discharge status: Home / Self Care | Payer: Medicare Other | Attending: Internal Medicine | Admitting: Internal Medicine

## 2019-08-29 DIAGNOSIS — N1832 Chronic kidney disease, stage 3b: Secondary | ICD-10-CM | POA: Insufficient documentation

## 2019-08-29 DIAGNOSIS — E875 Hyperkalemia: Secondary | ICD-10-CM | POA: Diagnosis not present

## 2019-08-29 LAB — BASIC METABOLIC PANEL
Anion gap: 9 (ref 5–15)
BUN: 27 mg/dL — ABNORMAL HIGH (ref 8–23)
CO2: 22 mmol/L (ref 22–32)
Calcium: 8.3 mg/dL — ABNORMAL LOW (ref 8.9–10.3)
Chloride: 106 mmol/L (ref 98–111)
Creatinine, Ser: 1.56 mg/dL — ABNORMAL HIGH (ref 0.61–1.24)
GFR calc Af Amer: 51 mL/min — ABNORMAL LOW (ref >60)
GFR calc non Af Amer: 44 mL/min — ABNORMAL LOW (ref >60)
Glucose, Bld: 191 mg/dL — ABNORMAL HIGH (ref 70–99)
Potassium: 4.9 mmol/L (ref 3.5–5.1)
Sodium: 137 mmol/L (ref 135–145)

## 2019-08-30 ENCOUNTER — Ambulatory Visit (INDEPENDENT_AMBULATORY_CARE_PROVIDER_SITE_OTHER): Payer: Medicare Other | Admitting: Nurse Practitioner

## 2019-08-30 ENCOUNTER — Encounter (INDEPENDENT_AMBULATORY_CARE_PROVIDER_SITE_OTHER): Payer: Self-pay | Admitting: Nurse Practitioner

## 2019-08-30 ENCOUNTER — Other Ambulatory Visit: Payer: Self-pay

## 2019-08-30 ENCOUNTER — Encounter (INDEPENDENT_AMBULATORY_CARE_PROVIDER_SITE_OTHER): Payer: Medicare Other

## 2019-08-30 ENCOUNTER — Ambulatory Visit (INDEPENDENT_AMBULATORY_CARE_PROVIDER_SITE_OTHER): Payer: Medicare Other

## 2019-08-30 VITALS — BP 123/69 | HR 118 | Resp 18 | Wt 273.0 lb

## 2019-08-30 DIAGNOSIS — L97929 Non-pressure chronic ulcer of unspecified part of left lower leg with unspecified severity: Secondary | ICD-10-CM | POA: Diagnosis not present

## 2019-08-30 DIAGNOSIS — K219 Gastro-esophageal reflux disease without esophagitis: Secondary | ICD-10-CM | POA: Diagnosis not present

## 2019-08-30 DIAGNOSIS — L03119 Cellulitis of unspecified part of limb: Secondary | ICD-10-CM

## 2019-08-30 DIAGNOSIS — I890 Lymphedema, not elsewhere classified: Secondary | ICD-10-CM | POA: Diagnosis not present

## 2019-08-30 DIAGNOSIS — L97919 Non-pressure chronic ulcer of unspecified part of right lower leg with unspecified severity: Secondary | ICD-10-CM

## 2019-08-30 NOTE — Progress Notes (Signed)
[name], male    DOB: 1946/09/16, 73 y.o.   MRN: [id number]  HPI  Mr Mccaughan is a 73 y/o male with a history of DM, hyperlipidemia, HTN, thyroid disease, depression, current chewing tobacco use and chronic heart failure.   Echo report from 08/20/2019 reviewed and showed an EF of 50-55% along with mild MR/TR.   Admitted 08/18/2019 due to bilateral lower extremity cellulitis-acute on chronic with h/o Chronic Lymphedema along with acute on chronic HF. Vascular surgery was consulted. Left leg ultrasound negative for DVT. Initially given IV antibiotics and then transitioned to oral antibiotics. Initially given IV lasix and then transitioned to oral diuretics.  Discharged after 3 days.   He presents today for his initial visit with a chief complaint of moderate shortness of breath upon minimal exertion. He describes this as chronic in nature having been present for several years. He has associated fatigue, pedal edema, difficulty sleeping and easy bruising along with this. He denies any abdominal distention, palpitations, chest pain, cough, dizziness or weight gain.   Past Medical History:  Diagnosis Date  . CHF (congestive heart failure) ([place])   . Depression   . 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 Gastroparesis   . Graves disease   . Hypercholesterolemia   . Hypertension   . Mantle cell lymphoma ([place]) 07/2014  . Peripheral neuropathy    Past Surgical History:  Procedure Laterality Date  . CHOLECYSTECTOMY    . NEPHRECTOMY     right  . PACEMAKER INSERTION N/A 12/07/2017   Procedure: INSERTION PACEMAKER DUAL CHAMBER INITIAL INSERT;  Surgeon: Isaias Cowman, MD;  Location: [facility] ORS;  Service: Cardiovascular;  Laterality: N/A;  . PORTA CATH INSERTION N/A 11/03/2017   Procedure: PORTA CATH INSERTION;  Surgeon: Katha Cabal, MD;  Location: [facility];  Service: Cardiovascular;  Laterality: N/A;   Family  History  Problem Relation Age of Onset  . Breast cancer Mother   . Diabetes Father   . Cancer Father        Lymphoma  . Alcohol abuse Maternal Uncle   . Diabetes Sister   . Heart disease Other        grandfather   Social History   Tobacco Use  . Smoking status: Former Research scientist (life sciences)  . Smokeless tobacco: Current User    Types: Chew  Substance Use Topics  . Alcohol use: Yes    Alcohol/week: 3.0 standard drinks    Types: 3 Cans of beer per week    Comment: nightly   Allergies  Allergen Reactions  . Rofecoxib Nausea Only   Prior to Admission medications   Medication Sig Start Date End Date Taking? Authorizing Provider  ACCU-CHEK AVIVA PLUS test strip USE AS INSTRUCTED. USE 1 STRIP EVERY 3 HOURS . ACCU-CHECK AVIVA PLUS TEST STRIP. DX U76.546 01/06/17  Yes [provider]  acetaminophen (TYLENOL) 500 MG tablet Take 500 mg by mouth every 6 (six) hours as needed.    Yes [provider]  carvedilol (COREG) 3.125 MG tablet Take 3.125 mg by mouth 2 (two) times daily with a meal.  10/07/17 08/31/19 Yes [provider]  cephALEXin (KEFLEX) 250 MG capsule Take 1 capsule (250 mg total) by mouth 2 (two) times daily. 08/21/19  Yes Fritzi Mandes, MD  cyanocobalamin (,VITAMIN B-12,) 1000 MCG/ML injection Inject into the muscle.   Yes [provider]  ferrous sulfate 325 (65 FE) MG tablet Take 325  mg by mouth daily.   Yes [provider]  furosemide (LASIX) 20 MG tablet Take 20 mg by mouth daily.   Yes [provider]  GLUCAGON EMERGENCY 1 MG injection  11/30/17  Yes [provider]  glucagon, human recombinant, (GLUCAGEN DIAGNOSTIC) 1 MG injection Use as directed 11/30/17  Yes [provider]  glucose 4 GM chewable tablet Chew 1 tablet by mouth once as needed for low blood sugar.   Yes [provider]  glucose blood (PRECISION QID TEST) test strip Use 8 (eight) times daily as directed. BAYER CONTOUR NEXT TEST STRIPS E10.649  03/03/18  Yes [provider]  insulin aspart (NOVOLOG) 100 UNIT/ML injection Basal rate 12am 0.8 units per hour, 9am 0.9 units per hour. (total basal insulin 20.7 units). Carbohydrate ratio 1 units for every 8gm of carbohydrate. Correction factor 1 units for every 50mg /dl over target cbg. Target CBG 80-120 10/29/17  Yes Wieting, Richard, MD  levothyroxine (SYNTHROID) 25 MCG tablet Take by mouth. 03/29/19  Yes [provider]  Multiple Vitamin (MULTIVITAMIN) tablet Take 1 tablet by mouth daily.   Yes [provider]  pravastatin (PRAVACHOL) 40 MG tablet Take 40 mg by mouth daily.  03/29/19  Yes [provider]  prochlorperazine (COMPAZINE) 10 MG tablet TAKE 1 TABLET (10 MG TOTAL) BY MOUTH EVERY 6 (SIX) HOURS AS NEEDED (NAUSEA OR VOMITING). 10/24/17  Yes [provider]  spironolactone (ALDACTONE) 25 MG tablet Take 25 mg by mouth daily.    [provider]     Review of Systems  Constitutional: Positive for appetite change (up and down) and fatigue.  HENT: Negative for congestion, postnasal drip and sore throat.   Eyes: Negative.   Respiratory: Positive for shortness of breath (easily). Negative for cough.   Cardiovascular: Positive for leg swelling. Negative for chest pain and palpitations.  Gastrointestinal: Negative for abdominal distention and abdominal pain.  Endocrine: Negative.   Genitourinary: Negative.   Musculoskeletal: Positive for neck pain. Negative for back pain.  Skin: Negative.   Allergic/Immunologic: Negative.   Neurological: Negative for dizziness and light-headedness.  Hematological: Negative for adenopathy. Bruises/bleeds easily.  Psychiatric/Behavioral: Positive for sleep disturbance (at times; sleeping on rolled blanket due to comfort). Negative for dysphoric mood. The patient is not nervous/anxious.     Vitals:   08/31/19 1048  BP: 138/76  Pulse: 92  Resp: 18  SpO2: 100%  Weight: 276 lb (125.2 kg)  Height: 5\' 10"   (1.778 m)   Wt Readings from Last 3 Encounters:  08/31/19 276 lb (125.2 kg)  08/30/19 273 lb (123.8 kg)  08/25/19 276 lb (125.2 kg)   Lab Results  Component Value Date   CREATININE 1.56 (H) 08/29/2019   CREATININE 1.68 (H) 08/25/2019   CREATININE 2.76 (H) 08/21/2019    Physical Exam Vitals signs and nursing note reviewed.  Constitutional:      Appearance: He is well-developed.  HENT:     Head: Normocephalic and atraumatic.  Neck:     Musculoskeletal: Normal range of motion.     Vascular: No JVD.  Cardiovascular:     Rate and Rhythm: Normal rate and regular rhythm.  Pulmonary:     Effort: Pulmonary effort is normal. No respiratory distress.     Breath sounds: No rhonchi or rales.  Abdominal:     Palpations: Abdomen is soft.     Tenderness: There is no abdominal tenderness.  Musculoskeletal:     Right lower leg: Edema present.  Left lower leg: He exhibits no tenderness. Edema present.  Skin:    General: Skin is warm and dry.     Findings: Ecchymosis (bilateral arms) present.  Neurological:     General: No focal deficit present.     Mental Status: He is alert and oriented to person, place, and time.  Psychiatric:        Mood and Affect: Mood normal.        Behavior: Behavior normal.    Assessment & Plan:  1: Chronic heart failure with preserved ejection fraction- - NYHA class III - euvolemic - not weighing daily but does have scales; instructed to begin weighing daily and call for an overnight weight gain of >2 pounds or a weekly weight gain of >5 pounds - not adding salt and tries to eat low sodium; low sodium cookbook and written dietary information given to him - saw cardiology Nehemiah Massed) 05/04/2019 - BNP 12/02/17 was 469.0 - patient reports receiving his flu vaccine for this season  2: HTN- - BP looks good today - saw PCP Nicki Reaper) 08/25/2019 - BMP 08/29/2019 reviewed and showed sodium 137, potassium 4.9, creatinine 1.56 and GFR 44  3: DM- - A1c  08/25/2019 was 6.8% - saw endocrinology Honor Junes) 07/18/2019 - saw vascular Owens Shark) 08/30/2019 - glucose at home was 90  4: Lymphedema- - legs are wrapped in UNNA boots - getting changed weekly by vascular - saw vascular Owens Shark) 08/30/2019  5: Chewing tobacco usage- - chewing tobacco - complete cessation discussed for 3 minutes with him  Patient did not bring his medications nor a list. Each medication was verbally reviewed with the patient and he was encouraged to bring the bottles to every visit to confirm accuracy of list.  Return in 3 months or sooner for any questions/problems before then.

## 2019-08-31 ENCOUNTER — Ambulatory Visit: Payer: Medicare Other | Attending: Family | Admitting: Family

## 2019-08-31 ENCOUNTER — Encounter: Payer: Self-pay | Admitting: Family

## 2019-08-31 VITALS — BP 138/76 | HR 92 | Resp 18 | Ht 70.0 in | Wt 276.0 lb

## 2019-08-31 DIAGNOSIS — E785 Hyperlipidemia, unspecified: Secondary | ICD-10-CM | POA: Insufficient documentation

## 2019-08-31 DIAGNOSIS — R0602 Shortness of breath: Secondary | ICD-10-CM | POA: Diagnosis not present

## 2019-08-31 DIAGNOSIS — L03116 Cellulitis of left lower limb: Secondary | ICD-10-CM | POA: Diagnosis not present

## 2019-08-31 DIAGNOSIS — Z720 Tobacco use: Secondary | ICD-10-CM

## 2019-08-31 DIAGNOSIS — L03115 Cellulitis of right lower limb: Secondary | ICD-10-CM | POA: Insufficient documentation

## 2019-08-31 DIAGNOSIS — E079 Disorder of thyroid, unspecified: Secondary | ICD-10-CM | POA: Diagnosis not present

## 2019-08-31 DIAGNOSIS — K3184 Gastroparesis: Secondary | ICD-10-CM | POA: Diagnosis not present

## 2019-08-31 DIAGNOSIS — I110 Hypertensive heart disease with heart failure: Secondary | ICD-10-CM | POA: Diagnosis not present

## 2019-08-31 DIAGNOSIS — Z8249 Family history of ischemic heart disease and other diseases of the circulatory system: Secondary | ICD-10-CM | POA: Insufficient documentation

## 2019-08-31 DIAGNOSIS — R5383 Other fatigue: Secondary | ICD-10-CM | POA: Insufficient documentation

## 2019-08-31 DIAGNOSIS — E7800 Pure hypercholesterolemia, unspecified: Secondary | ICD-10-CM | POA: Insufficient documentation

## 2019-08-31 DIAGNOSIS — I890 Lymphedema, not elsewhere classified: Secondary | ICD-10-CM | POA: Diagnosis not present

## 2019-08-31 DIAGNOSIS — Z794 Long term (current) use of insulin: Secondary | ICD-10-CM | POA: Insufficient documentation

## 2019-08-31 DIAGNOSIS — Z79899 Other long term (current) drug therapy: Secondary | ICD-10-CM | POA: Diagnosis not present

## 2019-08-31 DIAGNOSIS — Z87891 Personal history of nicotine dependence: Secondary | ICD-10-CM | POA: Diagnosis not present

## 2019-08-31 DIAGNOSIS — I5032 Chronic diastolic (congestive) heart failure: Secondary | ICD-10-CM

## 2019-08-31 DIAGNOSIS — E1142 Type 2 diabetes mellitus with diabetic polyneuropathy: Secondary | ICD-10-CM | POA: Diagnosis not present

## 2019-08-31 DIAGNOSIS — E1022 Type 1 diabetes mellitus with diabetic chronic kidney disease: Secondary | ICD-10-CM

## 2019-08-31 DIAGNOSIS — I10 Essential (primary) hypertension: Secondary | ICD-10-CM

## 2019-08-31 NOTE — Patient Instructions (Addendum)
Begin weighing daily and call for an overnight weight gain of > 2 pounds or a weekly weight gain of >5 pounds. 

## 2019-09-01 DIAGNOSIS — R001 Bradycardia, unspecified: Secondary | ICD-10-CM | POA: Diagnosis not present

## 2019-09-01 DIAGNOSIS — I5022 Chronic systolic (congestive) heart failure: Secondary | ICD-10-CM | POA: Diagnosis not present

## 2019-09-01 DIAGNOSIS — I2510 Atherosclerotic heart disease of native coronary artery without angina pectoris: Secondary | ICD-10-CM | POA: Diagnosis not present

## 2019-09-01 DIAGNOSIS — Z6841 Body Mass Index (BMI) 40.0 and over, adult: Secondary | ICD-10-CM | POA: Diagnosis not present

## 2019-09-03 ENCOUNTER — Encounter (INDEPENDENT_AMBULATORY_CARE_PROVIDER_SITE_OTHER): Payer: Self-pay | Admitting: Nurse Practitioner

## 2019-09-03 NOTE — Progress Notes (Signed)
SUBJECTIVE:  [name], male    DOB: 19-Dec-1945, 73 y.o.   MRN: [id number] Chief Complaint  Patient presents with  . Follow-up    [facility] 1week ultrasound    HPI  Gary [name] is a 73 y.o. male after recently being sent to [facility] due to severe cellulitis of his bilateral lower extremities.  The patient reports his bilateral lower extremities feeling much better.  During the time that he was there he was also diuresed with IV Lasix and he feels that his shortness of breath is doing better.  The patient's abdomen appears less distended at this time.  His bilateral lower extremities look good.  He continues to have some edema.  There was concern about the patient was hospitalized that he may have some issues with his arterial circulation.  Today the patient underwent bilateral ABIs.  He was noncompressible bilaterally.  The right TBI 0.94 and the left is 1.09.  The patient has triphasic tibial artery waveforms bilaterally.  Past Medical History:  Diagnosis Date  . CHF (congestive heart failure) ([place])   . Depression   . 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 Gastroparesis   . Graves disease   . Hypercholesterolemia   . Hypertension   . Mantle cell lymphoma ([place]) 07/2014  . Peripheral neuropathy     Past Surgical History:  Procedure Laterality Date  . CHOLECYSTECTOMY    . NEPHRECTOMY     right  . PACEMAKER INSERTION N/A 12/07/2017   Procedure: INSERTION PACEMAKER DUAL CHAMBER INITIAL INSERT;  Surgeon: Isaias Cowman, MD;  Location: [facility] ORS;  Service: Cardiovascular;  Laterality: N/A;  . PORTA CATH INSERTION N/A 11/03/2017   Procedure: PORTA CATH INSERTION;  Surgeon: Katha Cabal, MD;  Location: [place] [facility];  Service: Cardiovascular;  Laterality: N/A;    Social History   Socioeconomic History  . Marital status: Married    Spouse name: Not on file  . Number of children: 1  . Years of  education: Not on file  . Highest education level: Not on file  Occupational History  . Not on file  Social Needs  . Financial resource strain: Patient refused  . Food insecurity    Worry: Patient refused    Inability: Patient refused  . Transportation needs    Medical: No    Non-medical: No  Tobacco Use  . Smoking status: Former Research scientist (life sciences)  . Smokeless tobacco: Current User    Types: Chew  Substance and Sexual Activity  . Alcohol use: Yes    Alcohol/week: 3.0 standard drinks    Types: 3 Cans of beer per week    Comment: nightly  . Drug use: Never  . Sexual activity: Not Currently  Lifestyle  . Physical activity    Days per week: 0 days    Minutes per session: 0 min  . Stress: Not at all  Relationships  . Social Herbalist on phone: Patient refused    Gets together: Patient refused    Attends religious service: Patient refused    Active member of club or organization: Patient refused    Attends meetings of clubs or organizations: Patient refused    Relationship status: Patient refused  . Intimate partner violence    Fear of current or ex partner: Patient refused    Emotionally abused: Patient refused    Physically abused: Patient refused    Forced sexual activity: Patient  refused  Other Topics Concern  . Not on file  Social History Narrative  . Not on file    Family History  Problem Relation Age of Onset  . Breast cancer Mother   . Diabetes Father   . Cancer Father        Lymphoma  . Alcohol abuse Maternal Uncle   . Diabetes Sister   . Heart disease Other        grandfather    Allergies  Allergen Reactions  . Rofecoxib Nausea Only     Review of Systems   Review of Systems: Negative Unless Checked Constitutional: [] Weight loss  [] Fever  [] Chills Cardiac: [] Chest pain   []  Atrial Fibrillation  [] Palpitations   [] Shortness of breath when laying flat   [x] Shortness of breath with exertion. [] Shortness of breath at rest Vascular:  [] Pain in legs  with walking   [] Pain in legs with standing [] Pain in legs when laying flat   [] Claudication    [] Pain in feet when laying flat    [] History of DVT   [] Phlebitis   [x] Swelling in legs   [] Varicose veins   [] Non-healing ulcers Pulmonary:   [] Uses home oxygen   [] Productive cough   [] Hemoptysis   [] Wheeze  [] COPD   [] Asthma Neurologic:  [] Dizziness   [] Seizures  [] Blackouts [] History of stroke   [] History of TIA  [] Aphasia   [] Temporary Blindness   [] Weakness or numbness in arm   [] Weakness or numbness in leg Musculoskeletal:   [] Joint swelling   [] Joint pain   [] Low back pain  []  History of Knee Replacement [] Arthritis [] back Surgeries  []  Spinal Stenosis    Hematologic:  [] Easy bruising  [] Easy bleeding   [] Hypercoagulable state   [x] Anemic Gastrointestinal:  [] Diarrhea   [] Vomiting  [] Gastroesophageal reflux/heartburn   [] Difficulty swallowing. [] Abdominal pain Genitourinary:  [x] Chronic kidney disease   [] Difficult urination  [] Anuric   [] Blood in urine [] Frequent urination  [] Burning with urination   [] Hematuria Skin:  [] Rashes   [] Ulcers [] Wounds Psychological:  [] History of anxiety   []  History of major depression  []  Memory Difficulties      OBJECTIVE:   Physical Exam  BP 123/69 (BP Location: Right Arm)   Pulse (!) 118   Resp 18   Wt 273 lb (123.8 kg)   BMI 39.17 kg/m   Gen: WD/WN, NAD Head: [place]/AT, No temporalis wasting.  Ear/Nose/Throat: Hearing grossly intact, nares w/o erythema or drainage Eyes: PER, EOMI, sclera nonicteric.  Neck: Supple, no masses.  No JVD.  Pulmonary:  Good air movement, no use of accessory muscles.  Cardiac: RRR Vascular: 2+ edema bilaterally  Vessel Right Left  Radial Palpable Palpable  Dorsalis Pedis Not Palpable Not Palpable  Posterior Tibial Not Palpable Not Palpable   Gastrointestinal: soft, non-distended. No guarding/no peritoneal signs.  Musculoskeletal: M/S 5/5 throughout. Cane for ambulation  Neurologic: Pain and light touch intact in  extremities.  Symmetrical.  Speech is fluent. Motor exam as listed above. Psychiatric: Judgment intact, Mood & affect appropriate for pt's clinical situation. Dermatologic: No Venous rashes. No Ulcers Noted.  No changes consistent with cellulitis. Lymph : No Cervical lymphadenopathy, dermal thickening bilaterally       ASSESSMENT AND PLAN:  1. Lymphedema No surgery or intervention at this point in time.    I have had a long discussion with the patient regarding venous insufficiency and why it  causes symptoms, specifically venous ulceration . I have discussed with the patient the chronic skin changes that accompany  venous insufficiency and the long term sequela such as infection and recurring  ulceration.  Patient will be placed in [employer] which will be changed weekly drainage permitting.  In addition, behavioral modification including several periods of elevation of the lower extremities during the day will be continued. Achieving a position with the ankles at heart level was stressed to the patient  The patient is instructed to begin routine exercise, especially walking on a daily basis  The patient will have his lower extremity edema evaluated in four weeks.  If they continue to do well we will take an unna vacation    2. Cellulitis of lower extremity, unspecified laterality Currently resolved  3. Gastroesophageal reflux disease, unspecified whether esophagitis present Continue PPI as already ordered, this medication has been reviewed and there are no changes at this time.  Avoidence of caffeine and alcohol  Moderate elevation of the head of the bed    Current Outpatient Medications on File Prior to Visit  Medication Sig Dispense Refill  . ACCU-CHEK AVIVA PLUS test strip USE AS INSTRUCTED. USE 1 STRIP EVERY 3 HOURS . ACCU-CHECK AVIVA PLUS TEST STRIP. DX E10.649  12  . acetaminophen (TYLENOL) 500 MG tablet Take 500 mg by mouth every 6 (six) hours as needed.     . cephALEXin  (KEFLEX) 250 MG capsule Take 1 capsule (250 mg total) by mouth 2 (two) times daily. 12 capsule 0  . cyanocobalamin (,VITAMIN B-12,) 1000 MCG/ML injection Inject into the muscle.    . ferrous sulfate 325 (65 FE) MG tablet Take 325 mg by mouth daily.    . furosemide (LASIX) 20 MG tablet Take 20 mg by mouth daily.    Marland Kitchen GLUCAGON EMERGENCY 1 MG injection     . glucagon, human recombinant, (GLUCAGEN DIAGNOSTIC) 1 MG injection Use as directed    . glucose 4 GM chewable tablet Chew 1 tablet by mouth once as needed for low blood sugar.    Marland Kitchen glucose blood (PRECISION QID TEST) test strip Use 8 (eight) times daily as directed. BAYER CONTOUR NEXT TEST STRIPS E10.649    . insulin aspart (NOVOLOG) 100 UNIT/ML injection Basal rate 12am 0.8 units per hour, 9am 0.9 units per hour. (total basal insulin 20.7 units). Carbohydrate ratio 1 units for every 8gm of carbohydrate. Correction factor 1 units for every 50mg /dl over target cbg. Target CBG 80-120 1 vial 12  . levothyroxine (SYNTHROID) 25 MCG tablet Take by mouth.    . Multiple Vitamin (MULTIVITAMIN) tablet Take 1 tablet by mouth daily.    . pravastatin (PRAVACHOL) 40 MG tablet Take 40 mg by mouth daily.     . prochlorperazine (COMPAZINE) 10 MG tablet TAKE 1 TABLET (10 MG TOTAL) BY MOUTH EVERY 6 (SIX) HOURS AS NEEDED (NAUSEA OR VOMITING).    Marland Kitchen spironolactone (ALDACTONE) 25 MG tablet Take 25 mg by mouth daily.    . carvedilol (COREG) 3.125 MG tablet Take 3.125 mg by mouth 2 (two) times daily with a meal.      Current Facility-Administered Medications on File Prior to Visit  Medication Dose Route Frequency Provider Last Rate Last Dose  . heparin lock flush 100 unit/mL  500 Units Intravenous Once Lloyd Huger, MD      . sodium chloride flush (NS) 0.9 % injection 10 mL  10 mL Intravenous PRN Lloyd Huger, MD   10 mL at 02/01/18 0829  . sodium chloride flush (NS) 0.9 % injection 10 mL  10 mL Intravenous PRN Delight Hoh  J, MD   10 mL at 04/05/18  0800    There are no Patient Instructions on file for this visit. No follow-ups on file.   Kris Hartmann, NP  This note was completed with Sales executive.  Any errors are purely unintentional.

## 2019-09-04 ENCOUNTER — Other Ambulatory Visit: Payer: Self-pay | Admitting: *Deleted

## 2019-09-04 ENCOUNTER — Other Ambulatory Visit: Payer: Self-pay

## 2019-09-04 NOTE — Patient Outreach (Addendum)
[facility]) Care Management  09/04/2019  Gary [name] 07-12-1946 [id number]   Follow up with EMMI-general patient  D/c from [place] Day #1 Date:08/24/19 [address] Reason:Got discharge papers? No Other questions/problems? Yes  Insurance:[employer] Medicare and [employer]na NAP  [facility] x1 ED visits x1in the last 6 months Last hospitalization 08/18/19-08/21/19 [facility] for CHF and cellulitis  Transition of care services noted to be completed by primary care MD office staffon 08/21/19- Dr [facility] will be completed by primary care provider office who will refer to [facility] care management if needed.   He was previously active with [facility] in 2019 for transition of care services   Outreach attempt #2 successful to the home number  Gary [name] is able to verify HIPAA (DOB, address)  [facility] RN Cm notes background noise. He left the room with the increase noise and was able to hear CM better  [facility] RN CM discussed with him the reason for the follow call related to his report of sob during the last contact on 08/28/19   On Today 09/04/19 Gary [name] again confirms he is doing "fair" and having difficulty with sob with walking especially. He informed [facility] RN CM he had been to 2 MDs but he is still not understanding the reason hs is sob. [facility] RN CM reminded him of the CHF information discussed on 08/28/19.  [facility] RN CM re-reviewed CHF as a condition in which the heart cannot pump enough blood to meet the body's needs, sodium intake and fluid/edema related to sodium and potassium imbalances.   Gary [name] confirms he was asked to weigh but has not started "yet" He was encouraged by [facility] RN CM to start on 09/05/19 and to write down the weight to help his MDs to help him. He informs [facility] RN CM his MDs did not want to change his medicines during his recent visits. [facility] RN CM discussed the purpose of his  furosemide (Lasix) in removing fluid/edema. He reports taking lasix 20 mg qd as ordered.  [facility] RN CM discussed how foods with increase salt may lead to increase fluid/edema and how the increase fluid/edema in his legs can lead to cellulitis/infecton and difficulty with ambulating and sob. He voiced understanding [facility] RN CM and Gary [name] discussed his weight. The d/c summary states he weighed 183 lbs and he reports he recently weighed 176 lbs at his MD office but has not weighed at home. [facility] RN CM reviewed again the importance of him calling his cardiologist to report s/s in the CHF action plan. Each s/s reviewed. Gary [name] confirms today he has CHF yellow zone s/s of swelling of his legs, swelling of his stomach, sob, he is not able to lie flat when sleeping, he feels tired and had moments of dizziness. He states he is not voiding frequently like he was in the hospital He reports a decrease in urination. He denies a dry hacky cough and chest pain but has noted some episodes of sneezing. He denies red zone s/s but encouraged to call EMS if he has chf red zone s/s He voiced understanding.  Gary [name] voices understanding of why he is now having sob   He reports having difficulty walking related to sob. [facility] RN CM discussed home health (HH)PT services that may be able to assist but Gary [name] reports he does not want [facility] PT visits.  [facility] RN CM offered to  assist with calling his MD for him. He informs [facility] RN CM he has an appointment with his vascular MD on 09/05/19 and he will review these s/s.He does agree to [facility] RN CM routing this note to his MDs and following up with him in "a month"  Conditions:  Systolic CHF (EF of 91-47% in 11/2017), , bilateral lower extremity cellulitis, HTN, CAD, bilateral carotid artery stenosis, PVC, GERD,  Thyroid disease, Diabetes type 1 (last HgA1c 6.3) , graves, disease, peripheral neuropathy, Saturday night paralysis, humerus fx, onychomycosis of toenails of both feet with pain, calf ulcer, hx of  chest pain, CKD, hx of fall, mantle cell lymphoma, depression, Graves disease, HLD,    WGN:FAOZHYQMVH pump, Unna boot, cane, walker, accu chek aviva plus, insulin pump, scales   Appointments 09/05/19 0945 vascular  f/u Gary Ditch NP  unna wrap  09/13/19 0945 vascular  f/u Gary Ditch NP  unna wrap  09/20/19 0945 vascular  f/u Gary Ditch NP unna wrap  09/25/19 1315 podiatry Dr [name]  3 month nail care  11/15/19 0930 [name] 4 month f/u and visit wit Dr Delight Hoh at 1045 for CT results, [facility] cardiology medonc 11/30/18 [place] [facility]  01/24/19 0900 labs   Consent: [facility] RN CM reviewed [facility] services with patient. Patient gave verbal consent for services [facility] telephonic RN CM.   Advised patient that there will be further automated EMMI-post discharge calls to assess how the patient is doing following the recent hospitalization Advised the patient that another call may be received from a nurse if any of their responses were abnormal. Patient voiced understanding and was appreciative of f/u call.  [facility] RN CM will follow up with Gary [name] as he requested in 75 -35 days for f/u on his status and any further questions to understand his medical issues   He was also encouraged to return a call to [facility] RN CM if he does not have CHF resolution or have further questions  Routed note to MDs   Joelene Millin L. Lavina Hamman, RN, BSN, [place] Coordinator Office number [phone] Mobile number [phone]  Main [facility] number [phone] Fax number [phone]

## 2019-09-05 ENCOUNTER — Ambulatory Visit (INDEPENDENT_AMBULATORY_CARE_PROVIDER_SITE_OTHER): Payer: Medicare Other | Admitting: Nurse Practitioner

## 2019-09-05 ENCOUNTER — Encounter (INDEPENDENT_AMBULATORY_CARE_PROVIDER_SITE_OTHER): Payer: Self-pay

## 2019-09-05 VITALS — BP 163/77 | HR 109 | Resp 17 | Wt 281.0 lb

## 2019-09-05 DIAGNOSIS — L97201 Non-pressure chronic ulcer of unspecified calf limited to breakdown of skin: Secondary | ICD-10-CM | POA: Diagnosis not present

## 2019-09-05 NOTE — Progress Notes (Signed)
History of Present Illness  There is no documented history at this time  Assessments & Plan   There are no diagnoses linked to this encounter.    Additional instructions  Subjective:  Patient presents with venous ulcer of the Bilateral lower extremity.    Procedure:  3 layer unna wrap was placed Bilateral lower extremity.   Plan:   Follow up in one week.  

## 2019-09-11 ENCOUNTER — Encounter (INDEPENDENT_AMBULATORY_CARE_PROVIDER_SITE_OTHER): Payer: Self-pay | Admitting: Nurse Practitioner

## 2019-09-11 DIAGNOSIS — M7062 Trochanteric bursitis, left hip: Secondary | ICD-10-CM | POA: Diagnosis not present

## 2019-09-11 DIAGNOSIS — M1612 Unilateral primary osteoarthritis, left hip: Secondary | ICD-10-CM | POA: Diagnosis not present

## 2019-09-13 ENCOUNTER — Encounter (INDEPENDENT_AMBULATORY_CARE_PROVIDER_SITE_OTHER): Payer: Medicare Other

## 2019-09-13 DIAGNOSIS — N1832 Chronic kidney disease, stage 3b: Secondary | ICD-10-CM | POA: Diagnosis not present

## 2019-09-13 DIAGNOSIS — I129 Hypertensive chronic kidney disease with stage 1 through stage 4 chronic kidney disease, or unspecified chronic kidney disease: Secondary | ICD-10-CM | POA: Insufficient documentation

## 2019-09-13 DIAGNOSIS — R809 Proteinuria, unspecified: Secondary | ICD-10-CM | POA: Insufficient documentation

## 2019-09-13 DIAGNOSIS — I10 Essential (primary) hypertension: Secondary | ICD-10-CM | POA: Diagnosis not present

## 2019-09-13 DIAGNOSIS — E1029 Type 1 diabetes mellitus with other diabetic kidney complication: Secondary | ICD-10-CM | POA: Diagnosis not present

## 2019-09-13 DIAGNOSIS — R600 Localized edema: Secondary | ICD-10-CM | POA: Insufficient documentation

## 2019-09-13 DIAGNOSIS — Z905 Acquired absence of kidney: Secondary | ICD-10-CM | POA: Insufficient documentation

## 2019-09-14 ENCOUNTER — Other Ambulatory Visit: Payer: Self-pay

## 2019-09-14 ENCOUNTER — Ambulatory Visit (INDEPENDENT_AMBULATORY_CARE_PROVIDER_SITE_OTHER): Payer: Medicare Other | Admitting: Nurse Practitioner

## 2019-09-14 VITALS — BP 158/70 | HR 64 | Resp 16 | Ht 70.0 in | Wt 286.0 lb

## 2019-09-14 DIAGNOSIS — L97201 Non-pressure chronic ulcer of unspecified calf limited to breakdown of skin: Secondary | ICD-10-CM

## 2019-09-14 NOTE — Progress Notes (Signed)
History of Present Illness  There is no documented history at this time  Assessments & Plan   There are no diagnoses linked to this encounter.    Additional instructions  Subjective:  Patient presents with venous ulcer of the Bilateral lower extremity.    Procedure:  3 layer unna wrap was placed Bilateral lower extremity.   Plan:   Follow up in one week.  

## 2019-09-17 ENCOUNTER — Encounter (INDEPENDENT_AMBULATORY_CARE_PROVIDER_SITE_OTHER): Payer: Self-pay | Admitting: Nurse Practitioner

## 2019-09-20 ENCOUNTER — Encounter (INDEPENDENT_AMBULATORY_CARE_PROVIDER_SITE_OTHER): Payer: Self-pay | Admitting: Nurse Practitioner

## 2019-09-20 ENCOUNTER — Other Ambulatory Visit: Payer: Self-pay

## 2019-09-20 ENCOUNTER — Ambulatory Visit (INDEPENDENT_AMBULATORY_CARE_PROVIDER_SITE_OTHER): Payer: Medicare Other | Admitting: Nurse Practitioner

## 2019-09-20 VITALS — BP 148/88 | HR 109 | Resp 18 | Ht 70.0 in | Wt 287.0 lb

## 2019-09-20 DIAGNOSIS — L97201 Non-pressure chronic ulcer of unspecified calf limited to breakdown of skin: Secondary | ICD-10-CM | POA: Diagnosis not present

## 2019-09-20 DIAGNOSIS — K219 Gastro-esophageal reflux disease without esophagitis: Secondary | ICD-10-CM | POA: Diagnosis not present

## 2019-09-20 DIAGNOSIS — I890 Lymphedema, not elsewhere classified: Secondary | ICD-10-CM

## 2019-09-24 ENCOUNTER — Encounter (INDEPENDENT_AMBULATORY_CARE_PROVIDER_SITE_OTHER): Payer: Self-pay | Admitting: Nurse Practitioner

## 2019-09-24 NOTE — Progress Notes (Signed)
SUBJECTIVE:  [name], male    DOB: 18-Aug-1946, 73 y.o.   MRN: [id number] Chief Complaint  Patient presents with  . Follow-up    unna check    HPI  Gary [name] is a 73 y.o. male presents today for lower extremity leg swelling evaluation.  Today the patient's lower extremity leg swelling somewhat better.  The patient's lower extremity wounds are looking much improved however he still has some small ulcerations bilaterally.  He denies any fever, chills, nausea, vomiting or diarrhea.  He states that some of his diuretic medications have been changed around and he feels that this is helping his edema he also states that his shortness of breath is doing better as well.  Patient denies any claudication pain.  The patient does not his feet are a little bit more discolored than normal today.  He denies any pain of his lower extremities.  Past Medical History:  Diagnosis Date  . CHF (congestive heart failure) ([place])   . Depression   . 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 Gastroparesis   . Graves disease   . Hypercholesterolemia   . Hypertension   . Mantle cell lymphoma ([place]) 07/2014  . Peripheral neuropathy     Past Surgical History:  Procedure Laterality Date  . CHOLECYSTECTOMY    . NEPHRECTOMY     right  . PACEMAKER INSERTION N/A 12/07/2017   Procedure: INSERTION PACEMAKER DUAL CHAMBER INITIAL INSERT;  Surgeon: Isaias Cowman, MD;  Location: [facility] ORS;  Service: Cardiovascular;  Laterality: N/A;  . PORTA CATH INSERTION N/A 11/03/2017   Procedure: PORTA CATH INSERTION;  Surgeon: Katha Cabal, MD;  Location: [place] CV LAB;  Service: Cardiovascular;  Laterality: N/A;    Social History   Socioeconomic History  . Marital status: Married    Spouse name: Not on file  . Number of children: 1  . Years of education: Not on file  . Highest education level: Not on file  Occupational History  . Not  on file  Tobacco Use  . Smoking status: Former Research scientist (life sciences)  . Smokeless tobacco: Current User    Types: Chew  Substance and Sexual Activity  . Alcohol use: Yes    Alcohol/week: 3.0 standard drinks    Types: 3 Cans of beer per week    Comment: nightly  . Drug use: Never  . Sexual activity: Not Currently  Other Topics Concern  . Not on file  Social History Narrative  . Not on file   Social Determinants of Health   Financial Resource Strain: Unknown  . Difficulty of Paying Living Expenses: Patient refused  Food Insecurity: Unknown  . Worried About Charity fundraiser in the Last Year: Patient refused  . Ran Out of Food in the Last Year: Patient refused  Transportation Needs: No Transportation Needs  . Lack of Transportation (Medical): No  . Lack of Transportation (Non-Medical): No  Physical Activity: Inactive  . Days of Exercise per Week: 0 days  . Minutes of Exercise per Session: 0 min  Stress: No Stress Concern Present  . Feeling of Stress : Not at all  Social Connections: Unknown  . Frequency of Communication with Friends and Family: Patient refused  . Frequency of Social Gatherings with Friends and Family: Patient refused  . Attends Religious Services: Patient refused  . Active Member of Clubs or Organizations: Patient refused  . Attends Archivist Meetings: Patient  refused  . Marital Status: Patient refused  Intimate Partner Violence: Unknown  . Fear of Current or Ex-Partner: Patient refused  . Emotionally Abused: Patient refused  . Physically Abused: Patient refused  . Sexually Abused: Patient refused    Family History  Problem Relation Age of Onset  . Breast cancer Mother   . Diabetes Father   . Cancer Father        Lymphoma  . Alcohol abuse Maternal Uncle   . Diabetes Sister   . Heart disease Other        grandfather    Allergies  Allergen Reactions  . Rofecoxib Nausea Only     Review of Systems   Review of Systems: Negative Unless Checked  Constitutional: [] Weight loss  [] Fever  [] Chills Cardiac: [] Chest pain   []  Atrial Fibrillation  [] Palpitations   [] Shortness of breath when laying flat   [x] Shortness of breath with exertion. [] Shortness of breath at rest Vascular:  [] Pain in legs with walking   [] Pain in legs with standing [] Pain in legs when laying flat   [] Claudication    [] Pain in feet when laying flat    [] History of DVT   [] Phlebitis   [x] Swelling in legs   [] Varicose veins   [] Non-healing ulcers Pulmonary:   [] Uses home oxygen   [] Productive cough   [] Hemoptysis   [] Wheeze  [] COPD   [] Asthma Neurologic:  [] Dizziness   [] Seizures  [] Blackouts [] History of stroke   [] History of TIA  [] Aphasia   [] Temporary Blindness   [] Weakness or numbness in arm   [] Weakness or numbness in leg Musculoskeletal:   [] Joint swelling   [] Joint pain   [] Low back pain  []  History of Knee Replacement [] Arthritis [] back Surgeries  []  Spinal Stenosis    Hematologic:  [] Easy bruising  [] Easy bleeding   [] Hypercoagulable state   [x] Anemic Gastrointestinal:  [] Diarrhea   [] Vomiting  [x] Gastroesophageal reflux/heartburn   [] Difficulty swallowing. [] Abdominal pain Genitourinary:  [x] Chronic kidney disease   [] Difficult urination  [] Anuric   [] Blood in urine [] Frequent urination  [] Burning with urination   [] Hematuria Skin:  [] Rashes   [x] Ulcers [] Wounds Psychological:  [] History of anxiety   []  History of major depression  []  Memory Difficulties      OBJECTIVE:   Physical Exam  BP (!) 148/88 (BP Location: Right Arm)   Pulse (!) 109   Resp 18   Ht 5\' 10"  (1.778 m)   Wt 287 lb (130.2 kg)   BMI 41.18 kg/m   Gen: WD/WN, NAD Head: [place]/AT, No temporalis wasting.  Ear/Nose/Throat: Hearing grossly intact, nares w/o erythema or drainage Eyes: PER, EOMI, sclera nonicteric.  Neck: Supple, no masses.  No JVD.  Pulmonary:  Good air movement, no use of accessory muscles.  Cardiac: RRR Vascular:  Dusky appearing toes, 2+ edema bilaterally with small  shallow ulcerations bilaterally Vessel Right Left  Radial Palpable Palpable  Brachial Palpable Palpable  Femoral Palpable Palpable  Popliteal Palpable Palpable  Dorsalis Pedis Palpable Palpable  Posterior Tibial Palpable Palpable   Gastrointestinal: soft, non-distended. No guarding/no peritoneal signs.  Musculoskeletal: M/S 5/5 throughout.  No deformity or atrophy.  Neurologic: Pain and light touch intact in extremities.  Symmetrical.  Speech is fluent. Motor exam as listed above. Psychiatric: Judgment intact, Mood & affect appropriate for pt's clinical situation. Dermatologic: Bilateral stasis dermatitis with shallow ulcerations bilaterally No changes consistent with cellulitis. Lymph : No Cervical lymphadenopathy, dermal thickening and lichenification bilaterally       ASSESSMENT AND PLAN:  1. Lymphedema We will continue to keep the patient in bilateral Unna wraps.  In the meantime the patient will continue to elevate his lower extremities as much as possible.  He will also continue her exercise on a daily basis.  We will also continue use of his lymphedema pump.  We will see the patient back in 4 weeks to evaluate lower extremity edema.  However he will continue to come to the office on a weekly basis for dressing changes.  2. Skin ulcer of calf, limited to breakdown of skin, unspecified laterality ([place]) Wounds appear to be healing well.  However, what is a little more concerning is the patient's discoloration of his lower extremities.  Patient is always had some slight discoloration and ABIs done several weeks ago showed no evidence of peripheral arterial disease.  However the discoloration is worsening today.  We will do ABIs at patient's next visit to ensure that there has been no change.    3. Gastroesophageal reflux disease, unspecified whether esophagitis present Continue PPI as already ordered, this medication has been reviewed and there are no changes at this time.  Avoidence  of caffeine and alcohol  Moderate elevation of the head of the bed    Current Outpatient Medications on File Prior to Visit  Medication Sig Dispense Refill  . ACCU-CHEK AVIVA PLUS test strip USE AS INSTRUCTED. USE 1 STRIP EVERY 3 HOURS . ACCU-CHECK AVIVA PLUS TEST STRIP. DX E10.649  12  . acetaminophen (TYLENOL) 500 MG tablet Take 500 mg by mouth every 6 (six) hours as needed.     . cyanocobalamin (,VITAMIN B-12,) 1000 MCG/ML injection Inject into the muscle.    . ferrous sulfate 325 (65 FE) MG tablet Take 325 mg by mouth daily.    . furosemide (LASIX) 20 MG tablet Take 20 mg by mouth daily.    Marland Kitchen GLUCAGON EMERGENCY 1 MG injection     . glucagon, human recombinant, (GLUCAGEN DIAGNOSTIC) 1 MG injection Use as directed    . glucose 4 GM chewable tablet Chew 1 tablet by mouth once as needed for low blood sugar.    Marland Kitchen glucose blood (PRECISION QID TEST) test strip Use 8 (eight) times daily as directed. BAYER CONTOUR NEXT TEST STRIPS E10.649    . insulin aspart (NOVOLOG) 100 UNIT/ML injection Basal rate 12am 0.8 units per hour, 9am 0.9 units per hour. (total basal insulin 20.7 units). Carbohydrate ratio 1 units for every 8gm of carbohydrate. Correction factor 1 units for every 50mg /dl over target cbg. Target CBG 80-120 1 vial 12  . levothyroxine (SYNTHROID) 25 MCG tablet Take by mouth.    . Multiple Vitamin (MULTIVITAMIN) tablet Take 1 tablet by mouth daily.    . pravastatin (PRAVACHOL) 40 MG tablet Take 40 mg by mouth daily.     . prochlorperazine (COMPAZINE) 10 MG tablet TAKE 1 TABLET (10 MG TOTAL) BY MOUTH EVERY 6 (SIX) HOURS AS NEEDED (NAUSEA OR VOMITING).    Marland Kitchen spironolactone (ALDACTONE) 25 MG tablet Take 25 mg by mouth daily.    . carvedilol (COREG) 3.125 MG tablet Take 3.125 mg by mouth 2 (two) times daily with a meal.     . cephALEXin (KEFLEX) 250 MG capsule Take 1 capsule (250 mg total) by mouth 2 (two) times daily. (Patient not taking: Reported on 09/20/2019) 12 capsule 0   Current  Facility-Administered Medications on File Prior to Visit  Medication Dose Route Frequency Provider Last Rate Last Admin  . heparin lock flush 100 unit/mL  500 Units Intravenous Once Lloyd Huger, MD      . sodium chloride flush (NS) 0.9 % injection 10 mL  10 mL Intravenous PRN Lloyd Huger, MD   10 mL at 02/01/18 0829  . sodium chloride flush (NS) 0.9 % injection 10 mL  10 mL Intravenous PRN Lloyd Huger, MD   10 mL at 04/05/18 0800    There are no Patient Instructions on file for this visit. No follow-ups on file.   Kris Hartmann, NP  This note was completed with Sales executive.  Any errors are purely unintentional.

## 2019-09-25 ENCOUNTER — Ambulatory Visit: Payer: Medicare Other

## 2019-09-25 ENCOUNTER — Ambulatory Visit (INDEPENDENT_AMBULATORY_CARE_PROVIDER_SITE_OTHER): Payer: Medicare Other | Admitting: Podiatry

## 2019-09-25 ENCOUNTER — Other Ambulatory Visit: Payer: Self-pay

## 2019-09-25 ENCOUNTER — Encounter: Payer: Self-pay | Admitting: Podiatry

## 2019-09-25 DIAGNOSIS — L97519 Non-pressure chronic ulcer of other part of right foot with unspecified severity: Secondary | ICD-10-CM

## 2019-09-25 DIAGNOSIS — M79674 Pain in right toe(s): Secondary | ICD-10-CM

## 2019-09-25 DIAGNOSIS — E1151 Type 2 diabetes mellitus with diabetic peripheral angiopathy without gangrene: Secondary | ICD-10-CM | POA: Diagnosis not present

## 2019-09-25 DIAGNOSIS — M79675 Pain in left toe(s): Secondary | ICD-10-CM

## 2019-09-25 DIAGNOSIS — E11621 Type 2 diabetes mellitus with foot ulcer: Secondary | ICD-10-CM | POA: Diagnosis not present

## 2019-09-25 DIAGNOSIS — B351 Tinea unguium: Secondary | ICD-10-CM | POA: Diagnosis not present

## 2019-09-25 MED ORDER — CEPHALEXIN 500 MG PO CAPS: 500.0000 mg | ORAL_CAPSULE | Freq: Two times a day (BID) | ORAL | 0 refills | Status: DC

## 2019-09-25 NOTE — Progress Notes (Signed)
This this patient presents the office for continued evaluation and treatment of his long thick nails.  He presents for preventative foot care services.  He presents the office wearing Unna boots on both feet and says he has been treated by the vascular doctor who changes these Unna boots weekly.  He says his nails have grown thick and long and are painful walking wearing his shoes.  He is unable to self treat.  He also says that he has a painful fifth toe on his right foot since last week.  He says he fell twice last last week and try for 2 and then 3 hours hours before he was helped to his feet.  He believes he believes he put significant pressure through this fifth toe which has allowed this toe to become painful walking and wearing his shoes.  He says he has provided no self treatment to the fifth toe of his right foot.  He presents the office today for preventative foot care services.  General Appearance  Alert, conversant and in no acute stress.  Vascular  Deferred this visit.  Neurologic  Deferred this visit.  Nails Thick disfigured discolored nails with subungual debris  from hallux to fifth toes bilaterally. No evidence of bacterial infection or drainage bilaterally.  Orthopedic  No limitations of motion  feet .  No crepitus or effusions noted.  No bony pathology or digital deformities noted.  The fifth toe right foot is swollen and red.  Skin  normotropic skin with no porokeratosis noted bilaterally.  No signs of infections or ulcers noted.  Examination of his fifth toe reveals feels an open wound on the lateral aspect of the fifth toe which is painful to the touch.  There is a skin separating at the fourth interspace right foot.  This skin lesion opens and closes  based on position of the fifth toe right foot.    Onychomycosis  Skin lesion 4/5 interspace right foot.  Possible infection fifth toe right foot.  ROV.  Debride nails  X 10.  X-rays taken of his right fifth toe revealing no bony  pathology.  Discussed this condition with this patient.  Based on his history it does seem that he injured his fifth toe upon standing after he had fallen.  Patient was instructed on how to bandage his fifth toe right foot and sent home with home soaking instructions.  Prescribed cephalexin 500 mg with instructions to take 1 twice daily.  Finally there was a small open wound on the lateral aspect of the fifth toe which is painful to the touch.  Patient was told to bandage his foot.  He was told to soak his toe but then I realized he was unable to soak his toe due to unna boot right foot. Call the office for treatment of fifth toe as needed.  RTC 3 months.   [name] DPM

## 2019-09-27 ENCOUNTER — Other Ambulatory Visit: Payer: Self-pay

## 2019-09-27 ENCOUNTER — Encounter (INDEPENDENT_AMBULATORY_CARE_PROVIDER_SITE_OTHER): Payer: Self-pay

## 2019-09-27 ENCOUNTER — Ambulatory Visit (INDEPENDENT_AMBULATORY_CARE_PROVIDER_SITE_OTHER): Payer: Medicare Other | Admitting: Nurse Practitioner

## 2019-09-27 VITALS — BP 144/85 | HR 101 | Resp 18 | Wt 289.0 lb

## 2019-09-27 DIAGNOSIS — I890 Lymphedema, not elsewhere classified: Secondary | ICD-10-CM | POA: Diagnosis not present

## 2019-09-27 NOTE — Progress Notes (Signed)
History of Present Illness  There is no documented history at this time  Assessments & Plan   There are no diagnoses linked to this encounter.    Additional instructions  Subjective:  Patient presents with venous ulcer of the Bilateral lower extremity.    Procedure:  3 layer unna wrap was placed Bilateral lower extremity.   Plan:   Follow up in one week.  

## 2019-10-02 ENCOUNTER — Other Ambulatory Visit (INDEPENDENT_AMBULATORY_CARE_PROVIDER_SITE_OTHER): Payer: Self-pay | Admitting: Nurse Practitioner

## 2019-10-02 DIAGNOSIS — R238 Other skin changes: Secondary | ICD-10-CM

## 2019-10-02 DIAGNOSIS — L98499 Non-pressure chronic ulcer of skin of other sites with unspecified severity: Secondary | ICD-10-CM

## 2019-10-04 ENCOUNTER — Other Ambulatory Visit: Payer: Self-pay

## 2019-10-04 ENCOUNTER — Ambulatory Visit (INDEPENDENT_AMBULATORY_CARE_PROVIDER_SITE_OTHER): Payer: Medicare Other | Admitting: Nurse Practitioner

## 2019-10-04 ENCOUNTER — Ambulatory Visit: Payer: Self-pay | Admitting: *Deleted

## 2019-10-04 ENCOUNTER — Encounter (INDEPENDENT_AMBULATORY_CARE_PROVIDER_SITE_OTHER): Payer: Self-pay

## 2019-10-04 DIAGNOSIS — R600 Localized edema: Secondary | ICD-10-CM

## 2019-10-04 DIAGNOSIS — I890 Lymphedema, not elsewhere classified: Secondary | ICD-10-CM | POA: Diagnosis not present

## 2019-10-04 DIAGNOSIS — M7989 Other specified soft tissue disorders: Secondary | ICD-10-CM

## 2019-10-04 NOTE — Progress Notes (Signed)
History of Present Illness  There is no documented history at this time  Assessments & Plan   There are no diagnoses linked to this encounter.    Additional instructions  Subjective:  Patient presents with venous ulcer of the Bilateral lower extremity.    Procedure:  3 layer unna wrap was placed Bilateral lower extremity.   Plan:   Follow up in one week.  

## 2019-10-05 ENCOUNTER — Other Ambulatory Visit: Payer: Self-pay | Admitting: *Deleted

## 2019-10-05 NOTE — Patient Outreach (Signed)
[facility]) Care Management  10/05/2019  Gary [name] 04/10/1946 [id number]   Follow up outreach to  EMMI-general patient (08/24/19)  D/c from [place] [facility]  [facility] admissions x1 EDvisits x1in the last 6 months Last hospitalization 08/18/19-08/21/19 [facility] for CHF and cellulitis  Transition of care services noted to be completed by primary care MD office staffon 08/21/19- Dr [facility] will be completed by primary care provider office who will refer to [facility] care management if needed.   He was previously active with [facility] in 2019 for transition of care services   Outreach attempt #[id number] the home number  [name] is able to verify HIPAA (DOB, address)  [facility] RN Cm reviewed the reason for the follow up call to follow up on his shortness of breath (sob), weight and vascular MD visit   Today [name]'s tv volume was noted to be elevated. [facility] RN CM encouraged him to decrease the volume and he decreased the volume  congestive Heart Failure (CHF) Today [name] reports he continues to have some shortness of breath (sob)  He reports that he has been weighed by the doctors that he has seen but still continues not to weigh at home. He reports being aware that his weight (wt) has increased He recalls a recent weight of 289 lbs  He reports that his "fluid pill was increased to two tablets a day" by a male MD that he can not recall the name of "he is over beside the cat scan office." He reports a upcoming f/u with [name] primary care MD scheduled for 11/15/19.    [facility] RN CM discussed home management of sob as a congestive Heart Failure (CHF) symptom to include monitoring sodium intake, monitoring fluid intake, and weighing to monitor weight increases to report to MD to have medications adjusted as needed. [facility] RN CM inquired if [name] had ever been seen by a  nutritionist or dietitian. [name] reports he had seen a dietitian when he went to [facility]. [facility] RN CM offered to coordinate with his primary care MD a dietitian visit but [name] reports he does not think it would be beneficial as he is "on a fixed income and can not afford high price food."  [facility] RN CM discuss fresh vegetables and food labels related to sodium content and better choices of food based on the food label.  [facility] RN CM offered to refer him to [place] coach for further education on home care of CHF. [name] informs [facility] RN CM he is " tired of seeing doctors" [facility] RN CM explained to [name] that the [facility] health coach is not a doctor and he would not have to go visit the [facility] health coach. He states he would not prefer to be followed by [place] coach.     [facility] RN CM discussed [facility] services are available to [name] to offer assistance in managing his chronic illnesses but he confirms he is not interested in further [facility] services. He agrees to [place] CM discontinuing [facility] RN CM will close case at this time as patient has been assessed and he confirms and agrees to case closure as he voices he is not wanting to participate in [facility] services at this time   Pt encouraged to return a call to [facility] RN CM prn if he changes  his mind about participating in [facility] services   Routed note to MDs/NP/PA  [place]. Lavina Hamman, RN, BSN, [place] Coordinator Office number [phone] Mobile number [phone]  Main [facility] number [phone] Fax number [phone]

## 2019-10-09 ENCOUNTER — Encounter (INDEPENDENT_AMBULATORY_CARE_PROVIDER_SITE_OTHER): Payer: Self-pay | Admitting: Nurse Practitioner

## 2019-10-11 ENCOUNTER — Ambulatory Visit (INDEPENDENT_AMBULATORY_CARE_PROVIDER_SITE_OTHER): Payer: Medicare Other | Admitting: Nurse Practitioner

## 2019-10-11 ENCOUNTER — Other Ambulatory Visit: Payer: Self-pay

## 2019-10-11 ENCOUNTER — Encounter (INDEPENDENT_AMBULATORY_CARE_PROVIDER_SITE_OTHER): Payer: Self-pay | Admitting: Nurse Practitioner

## 2019-10-11 ENCOUNTER — Ambulatory Visit (INDEPENDENT_AMBULATORY_CARE_PROVIDER_SITE_OTHER): Payer: Medicare Other

## 2019-10-11 VITALS — BP 129/64 | HR 55 | Resp 18 | Wt 285.8 lb

## 2019-10-11 DIAGNOSIS — R238 Other skin changes: Secondary | ICD-10-CM | POA: Diagnosis not present

## 2019-10-11 DIAGNOSIS — I890 Lymphedema, not elsewhere classified: Secondary | ICD-10-CM | POA: Diagnosis not present

## 2019-10-11 DIAGNOSIS — I5023 Acute on chronic systolic (congestive) heart failure: Secondary | ICD-10-CM | POA: Diagnosis not present

## 2019-10-11 DIAGNOSIS — L98499 Non-pressure chronic ulcer of skin of other sites with unspecified severity: Secondary | ICD-10-CM | POA: Diagnosis not present

## 2019-10-11 DIAGNOSIS — I10 Essential (primary) hypertension: Secondary | ICD-10-CM | POA: Diagnosis not present

## 2019-10-11 NOTE — Progress Notes (Signed)
SUBJECTIVE:  [name], male    DOB: [birth year], 73 y.o.   MRN: [id number] Chief Complaint  Patient presents with  . Follow-up    u/s and unna follow up    HPI  Gary [name] is a 73 y.o. male that presents today for follow-up for his lower extremity edema as well as venous ulcerations.  Today his lower extremities look a little bit worse than when he was last seen previously.  His legs seem to be a little bit more swollen with some areas that look like they have begun to start weeping.  He continues to have some dusky appearing toes however no open wounds.  The patient underwent bilateral ABIs today which revealed a right ABI 1.22 and a left of 1.40 with bilateral triphasic tibial artery waveforms which is consistent with the previous study on 08/30/2019.  The patient has continued to take his upgraded diuretic dosage.  However, the patient does endorse having fallen a couple times recently has had increasing difficulty with keeping up after each fall.  He denies any fever, chills, nausea, vomiting or diarrhea.  He denies any chest pain but does have shortness of breath on exertion. Past Medical History:  Diagnosis Date  . CHF (congestive heart failure) ([place])   . Depression   . 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 Gastroparesis   . Graves disease   . Hypercholesterolemia   . Hypertension   . Mantle cell lymphoma ([place]) 07/2014  . Peripheral neuropathy     Past Surgical History:  Procedure Laterality Date  . CHOLECYSTECTOMY    . NEPHRECTOMY     right  . PACEMAKER INSERTION N/A 12/07/2017   Procedure: INSERTION PACEMAKER DUAL CHAMBER INITIAL INSERT;  Surgeon: Isaias Cowman, MD;  Location: [facility]S;  Service: Cardiovascular;  Laterality: N/A;  . PORTA CATH INSERTION N/A 11/03/2017   Procedure: PORTA CATH INSERTION;  Surgeon: Katha Cabal, MD;  Location: [facility];  Service: Cardiovascular;   Laterality: N/A;    Social History   Socioeconomic History  . Marital status: Married    Spouse name: Not on file  . Number of children: 1  . Years of education: Not on file  . Highest education level: Not on file  Occupational History  . Not on file  Tobacco Use  . Smoking status: Former Research scientist (life sciences)  . Smokeless tobacco: Current User    Types: Chew  Substance and Sexual Activity  . Alcohol use: Yes    Alcohol/week: 3.0 standard drinks    Types: 3 Cans of beer per week    Comment: nightly  . Drug use: Never  . Sexual activity: Not Currently  Other Topics Concern  . Not on file  Social History Narrative  . Not on file   Social Determinants of Health   Financial Resource Strain: Unknown  . Difficulty of Paying Living Expenses: Patient refused  Food Insecurity: Unknown  . Worried About Charity fundraiser in the Last Year: Patient refused  . Ran Out of Food in the Last Year: Patient refused  Transportation Needs: No Transportation Needs  . Lack of Transportation (Medical): No  . Lack of Transportation (Non-Medical): No  Physical Activity: Inactive  . Days of Exercise per Week: 0 days  . Minutes of Exercise per Session: 0 min  Stress: No Stress Concern Present  . Feeling of Stress : Not at all  Social Connections: Unknown  .  Frequency of Communication with Friends and Family: Patient refused  . Frequency of Social Gatherings with Friends and Family: Patient refused  . Attends Religious Services: Patient refused  . Active Member of Clubs or Organizations: Patient refused  . Attends Archivist Meetings: Patient refused  . Marital Status: Patient refused  Intimate Partner Violence: Unknown  . Fear of Current or Ex-Partner: Patient refused  . Emotionally Abused: Patient refused  . Physically Abused: Patient refused  . Sexually Abused: Patient refused    Family History  Problem Relation Age of Onset  . Breast cancer Mother   . Diabetes Father   . Cancer  Father        Lymphoma  . Alcohol abuse Maternal Uncle   . Diabetes Sister   . Heart disease Other        grandfather    Allergies  Allergen Reactions  . Rofecoxib Nausea Only     Review of Systems   Review of Systems: Negative Unless Checked Constitutional: [] Weight loss  [] Fever  [] Chills Cardiac: [] Chest pain   []  Atrial Fibrillation  [] Palpitations   [] Shortness of breath when laying flat   [] Shortness of breath with exertion. [] Shortness of breath at rest Vascular:  [] Pain in legs with walking   [] Pain in legs with standing [] Pain in legs when laying flat   [] Claudication    [] Pain in feet when laying flat    [] History of DVT   [] Phlebitis   [x] Swelling in legs   [] Varicose veins   [] Non-healing ulcers Pulmonary:   [] Uses home oxygen   [] Productive cough   [] Hemoptysis   [] Wheeze  [] COPD   [] Asthma Neurologic:  [] Dizziness   [] Seizures  [] Blackouts [] History of stroke   [] History of TIA  [] Aphasia   [] Temporary Blindness   [] Weakness or numbness in arm   [] Weakness or numbness in leg Musculoskeletal:   [] Joint swelling   [] Joint pain   [] Low back pain  []  History of Knee Replacement [] Arthritis [] back Surgeries  []  Spinal Stenosis    Hematologic:  [] Easy bruising  [] Easy bleeding   [] Hypercoagulable state   [x] Anemic Gastrointestinal:  [] Diarrhea   [] Vomiting  [] Gastroesophageal reflux/heartburn   [] Difficulty swallowing. [] Abdominal pain Genitourinary:  [x] Chronic kidney disease   [] Difficult urination  [] Anuric   [] Blood in urine [] Frequent urination  [] Burning with urination   [] Hematuria Skin:  [] Rashes   [x] Ulcers [] Wounds Psychological:  [] History of anxiety   []  History of major depression  []  Memory Difficulties      OBJECTIVE:   Physical Exam  BP 129/64 (BP Location: Right Arm)   Pulse (!) 55   Resp 18   Wt 285 lb 12.8 oz (129.6 kg)   BMI 41.01 kg/m   Gen: WD/WN, NAD Head: [place]/AT, No temporalis wasting.  Ear/Nose/Throat: Hearing grossly intact, nares w/o  erythema or drainage Eyes: PER, EOMI, sclera nonicteric.  Neck: Supple, no masses.  No JVD.  Pulmonary:  Good air movement, no use of accessory muscles.  Cardiac: RRR Vascular:  Dusky bluish discoloration on toes, 2+ edema bilaterally early weeping changes on right lower extremity Vessel Right Left  Radial Palpable Palpable  Dorsalis Pedis  not palpable  not palpable  Posterior Tibial  not palpable  not palpable   Gastrointestinal: soft, non-distended. No guarding/no peritoneal signs.  Musculoskeletal: M/S 5/5 throughout.  No deformity or atrophy.  Neurologic: Pain and light touch intact in extremities.  Symmetrical.  Speech is fluent. Motor exam as listed above. Psychiatric: Judgment intact, Mood &  affect appropriate for pt's clinical situation. Dermatologic:  No changes consistent with cellulitis. Lymph : No Cervical lymphadenopathy, no lichenification or skin changes of chronic lymphedema.       ASSESSMENT AND PLAN:  1. Lymphedema We will have the patient remain in [place] wraps at this time.  His right lower extremity has a small ulceration and the left is showing some early signs of weeping.  We will continue with weekly wraps however if he begins to soak through we will return to biweekly wraps.  We will reevaluate the patient's lower extremity edema in 4 weeks.  2. Acute on chronic systolic CHF (congestive heart failure) (HCC) Patient's heart failure contributes greatly to his lower extremity edema.  Patient states he is compliant with all medication however it seems that he may be starting to bleed begin.  We will keep a close eye on if need be we will return the patient to overwrap is on a biweekly basis.  Patient's dusky toe appearance may be related to reduced ejection fraction versus peripheral artery disease.  3. Essential hypertension, benign Continue antihypertensive medications as already ordered, these medications have been reviewed and there are no changes at this  time.    Current Outpatient Medications on File Prior to Visit  Medication Sig Dispense Refill  . ACCU-CHEK AVIVA PLUS test strip USE AS INSTRUCTED. USE 1 STRIP EVERY 3 HOURS . ACCU-CHECK AVIVA PLUS TEST STRIP. DX E10.649  12  . acetaminophen (TYLENOL) 500 MG tablet Take 500 mg by mouth every 6 (six) hours as needed.     . cephALEXin (KEFLEX) 500 MG capsule Take 1 capsule (500 mg total) by mouth 2 (two) times daily. 20 capsule 0  . cyanocobalamin (,VITAMIN B-12,) 1000 MCG/ML injection Inject into the muscle.    . ferrous sulfate 325 (65 FE) MG tablet Take 325 mg by mouth daily.    . furosemide (LASIX) 20 MG tablet Take 40 mg by mouth daily.     Marland Kitchen GLUCAGON EMERGENCY 1 MG injection     . glucagon, human recombinant, (GLUCAGEN DIAGNOSTIC) 1 MG injection Use as directed    . glucose 4 GM chewable tablet Chew 1 tablet by mouth once as needed for low blood sugar.    Marland Kitchen glucose blood (PRECISION QID TEST) test strip Use 8 (eight) times daily as directed. BAYER CONTOUR NEXT TEST STRIPS E10.649    . insulin aspart (NOVOLOG) 100 UNIT/ML injection Basal rate 12am 0.8 units per hour, 9am 0.9 units per hour. (total basal insulin 20.7 units). Carbohydrate ratio 1 units for every 8gm of carbohydrate. Correction factor 1 units for every 50mg /dl over target cbg. Target CBG 80-120 1 vial 12  . levothyroxine (SYNTHROID) 25 MCG tablet Take by mouth.    . Multiple Vitamin (MULTIVITAMIN) tablet Take 1 tablet by mouth 2 (two) times daily.     . mupirocin ointment (BACTROBAN) 2 % mupirocin 2 % topical ointment    . pravastatin (PRAVACHOL) 40 MG tablet Take 40 mg by mouth daily.     . prochlorperazine (COMPAZINE) 10 MG tablet TAKE 1 TABLET (10 MG TOTAL) BY MOUTH EVERY 6 (SIX) HOURS AS NEEDED (NAUSEA OR VOMITING).    Marland Kitchen spironolactone (ALDACTONE) 25 MG tablet Take 25 mg by mouth daily.    . carvedilol (COREG) 3.125 MG tablet Take 3.125 mg by mouth 2 (two) times daily with a meal.     . cephALEXin (KEFLEX) 250 MG capsule  Take 1 capsule (250 mg total) by mouth 2 (two) times  daily. (Patient not taking: Reported on 09/20/2019) 12 capsule 0   Current Facility-Administered Medications on File Prior to Visit  Medication Dose Route Frequency Provider Last Rate Last Admin  . heparin lock flush 100 unit/mL  500 Units Intravenous Once Lloyd Huger, MD      . sodium chloride flush (NS) 0.9 % injection 10 mL  10 mL Intravenous PRN Lloyd Huger, MD   10 mL at 02/01/18 0829  . sodium chloride flush (NS) 0.9 % injection 10 mL  10 mL Intravenous PRN Lloyd Huger, MD   10 mL at 04/05/18 0800    There are no Patient Instructions on file for this visit. No follow-ups on file.   Kris Hartmann, NP  This note was completed with Sales executive.  Any errors are purely unintentional.

## 2019-10-18 ENCOUNTER — Other Ambulatory Visit: Payer: Self-pay

## 2019-10-18 ENCOUNTER — Ambulatory Visit (INDEPENDENT_AMBULATORY_CARE_PROVIDER_SITE_OTHER): Payer: Medicare Other | Admitting: Nurse Practitioner

## 2019-10-18 VITALS — BP 141/83 | HR 113 | Resp 16 | Ht 70.0 in | Wt 285.0 lb

## 2019-10-18 DIAGNOSIS — L03119 Cellulitis of unspecified part of limb: Secondary | ICD-10-CM | POA: Diagnosis not present

## 2019-10-18 DIAGNOSIS — N1832 Chronic kidney disease, stage 3b: Secondary | ICD-10-CM | POA: Diagnosis not present

## 2019-10-18 MED ORDER — AMOXICILLIN-POT CLAVULANATE 875-125 MG PO TABS: 1.0000 | ORAL_TABLET | Freq: Two times a day (BID) | ORAL | 0 refills | Status: DC

## 2019-10-18 NOTE — Progress Notes (Signed)
History of Present Illness  There is no documented history at this time  Assessments & Plan   There are no diagnoses linked to this encounter.    Additional instructions  Subjective:  Patient presents with venous ulcer of the Bilateral lower extremity.    Procedure:  3 layer unna wrap was placed Bilateral lower extremity.   Plan:   Follow up in one week.  

## 2019-10-23 ENCOUNTER — Encounter (INDEPENDENT_AMBULATORY_CARE_PROVIDER_SITE_OTHER): Payer: Self-pay

## 2019-10-23 ENCOUNTER — Emergency Department: Payer: Medicare Other

## 2019-10-23 ENCOUNTER — Ambulatory Visit (INDEPENDENT_AMBULATORY_CARE_PROVIDER_SITE_OTHER): Payer: Medicare Other | Admitting: Nurse Practitioner

## 2019-10-23 ENCOUNTER — Emergency Department
Admission: EM | Admit: 2019-10-23 | Discharge: 2019-10-23 | Disposition: A | Discharge status: Home / Self Care | Payer: Medicare Other | Attending: Emergency Medicine | Admitting: Emergency Medicine

## 2019-10-23 ENCOUNTER — Encounter: Payer: Self-pay | Admitting: Emergency Medicine

## 2019-10-23 ENCOUNTER — Other Ambulatory Visit: Payer: Self-pay

## 2019-10-23 DIAGNOSIS — L03119 Cellulitis of unspecified part of limb: Secondary | ICD-10-CM | POA: Insufficient documentation

## 2019-10-23 DIAGNOSIS — I2510 Atherosclerotic heart disease of native coronary artery without angina pectoris: Secondary | ICD-10-CM | POA: Diagnosis not present

## 2019-10-23 DIAGNOSIS — Z794 Long term (current) use of insulin: Secondary | ICD-10-CM | POA: Diagnosis not present

## 2019-10-23 DIAGNOSIS — L03116 Cellulitis of left lower limb: Secondary | ICD-10-CM | POA: Diagnosis not present

## 2019-10-23 DIAGNOSIS — Z79899 Other long term (current) drug therapy: Secondary | ICD-10-CM | POA: Diagnosis not present

## 2019-10-23 DIAGNOSIS — I5022 Chronic systolic (congestive) heart failure: Secondary | ICD-10-CM | POA: Insufficient documentation

## 2019-10-23 DIAGNOSIS — L03115 Cellulitis of right lower limb: Secondary | ICD-10-CM | POA: Diagnosis not present

## 2019-10-23 DIAGNOSIS — E1022 Type 1 diabetes mellitus with diabetic chronic kidney disease: Secondary | ICD-10-CM | POA: Insufficient documentation

## 2019-10-23 DIAGNOSIS — Z950 Presence of cardiac pacemaker: Secondary | ICD-10-CM | POA: Diagnosis not present

## 2019-10-23 DIAGNOSIS — I130 Hypertensive heart and chronic kidney disease with heart failure and stage 1 through stage 4 chronic kidney disease, or unspecified chronic kidney disease: Secondary | ICD-10-CM | POA: Insufficient documentation

## 2019-10-23 DIAGNOSIS — R609 Edema, unspecified: Secondary | ICD-10-CM | POA: Diagnosis not present

## 2019-10-23 DIAGNOSIS — N183 Chronic kidney disease, stage 3 unspecified: Secondary | ICD-10-CM | POA: Insufficient documentation

## 2019-10-23 DIAGNOSIS — F17220 Nicotine dependence, chewing tobacco, uncomplicated: Secondary | ICD-10-CM | POA: Insufficient documentation

## 2019-10-23 DIAGNOSIS — R600 Localized edema: Secondary | ICD-10-CM | POA: Diagnosis not present

## 2019-10-23 DIAGNOSIS — M7989 Other specified soft tissue disorders: Secondary | ICD-10-CM | POA: Diagnosis present

## 2019-10-23 LAB — CBC WITH DIFFERENTIAL/PLATELET
Abs Immature Granulocytes: 0.05 10*3/uL (ref 0.00–0.07)
Basophils Absolute: 0.1 10*3/uL (ref 0.0–0.1)
Basophils Relative: 1 %
Eosinophils Absolute: 0.1 10*3/uL (ref 0.0–0.5)
Eosinophils Relative: 2 %
HCT: 38.6 % — ABNORMAL LOW (ref 39.0–52.0)
Hemoglobin: 12.8 g/dL — ABNORMAL LOW (ref 13.0–17.0)
Immature Granulocytes: 1 %
Lymphocytes Relative: 11 %
Lymphs Abs: 0.8 10*3/uL (ref 0.7–4.0)
MCH: 32.7 pg (ref 26.0–34.0)
MCHC: 33.2 g/dL (ref 30.0–36.0)
MCV: 98.5 fL (ref 80.0–100.0)
Monocytes Absolute: 0.7 10*3/uL (ref 0.1–1.0)
Monocytes Relative: 10 %
Neutro Abs: 5.1 10*3/uL (ref 1.7–7.7)
Neutrophils Relative %: 75 %
Platelets: 131 10*3/uL — ABNORMAL LOW (ref 150–400)
RBC: 3.92 MIL/uL — ABNORMAL LOW (ref 4.22–5.81)
RDW: 13.2 % (ref 11.5–15.5)
WBC: 6.8 10*3/uL (ref 4.0–10.5)
nRBC: 0 % (ref 0.0–0.2)

## 2019-10-23 LAB — COMPREHENSIVE METABOLIC PANEL
ALT: 14 U/L (ref 0–44)
AST: 23 U/L (ref 15–41)
Albumin: 3.3 g/dL — ABNORMAL LOW (ref 3.5–5.0)
Alkaline Phosphatase: 129 U/L — ABNORMAL HIGH (ref 38–126)
Anion gap: 10 (ref 5–15)
BUN: 22 mg/dL (ref 8–23)
CO2: 27 mmol/L (ref 22–32)
Calcium: 8.3 mg/dL — ABNORMAL LOW (ref 8.9–10.3)
Chloride: 104 mmol/L (ref 98–111)
Creatinine, Ser: 1.7 mg/dL — ABNORMAL HIGH (ref 0.61–1.24)
GFR calc Af Amer: 46 mL/min — ABNORMAL LOW (ref >60)
GFR calc non Af Amer: 40 mL/min — ABNORMAL LOW (ref >60)
Glucose, Bld: 56 mg/dL — ABNORMAL LOW (ref 70–99)
Potassium: 3.7 mmol/L (ref 3.5–5.1)
Sodium: 141 mmol/L (ref 135–145)
Total Bilirubin: 0.6 mg/dL (ref 0.3–1.2)
Total Protein: 6.6 g/dL (ref 6.5–8.1)

## 2019-10-23 MED ORDER — FUROSEMIDE 10 MG/ML IJ SOLN
60.0000 mg | Freq: Once | INTRAMUSCULAR | Status: AC
  Administered 2019-10-23: 60 mg via INTRAVENOUS
  Filled 2019-10-23: qty 8

## 2019-10-23 MED ORDER — LEVOFLOXACIN IN D5W 500 MG/100ML IV SOLN
500.0000 mg | Freq: Once | INTRAVENOUS | Status: AC
  Administered 2019-10-23: 16:00:00 500 mg via INTRAVENOUS
  Filled 2019-10-23: qty 100

## 2019-10-23 MED ORDER — LEVOFLOXACIN 500 MG PO TABS: 500.0000 mg | ORAL_TABLET | Freq: Every day | ORAL | 0 refills | Status: AC

## 2019-10-23 NOTE — Progress Notes (Signed)
Wraps placed on patient today.  The patient has severe erythema of his bilateral lower extremities.  The patient also has some green drainage on his bandages as well as within his wound, which is concerning for pseudomonal infection.  This is also suspicious based on the smell.  Patient does have a previous history of cellulitis.  The patient was given Bactrim about a week ago in order to try to help with cellulitis however it is worse since last time the patient was seen.  The patient also has a small skin tear on his left lower extremity.  Was also more concerning for the patient is that he seems to have progressive shortness of breath in addition to worsening abdomen swelling.  Patient's weight has also been increasing over the last several weeks and approximately at least 10 pounds since he was recently released from the hospital several weeks ago.  Based on the concern for worsening cellulitis in addition to possible fluid overload I have asked the patient to present himself to the emergency room for possible IV antibiotics in addition to possible diuresis.

## 2019-10-23 NOTE — ED Provider Notes (Signed)
[facility] Emergency Department Provider Note  Time seen: 4:30 PM  I have reviewed the triage vital signs and the nursing notes.   HISTORY  Chief Complaint Leg Swelling   HPI [name] is a 74 y.o. male with a past medical history of CHF, depression, diabetes, hypertension, hyperlipidemia, presents to the emergency department for evaluation of bilateral lower extremities.  According to the patient he has a history of peripheral edema, takes Lasix 20 mg tablet twice daily.  Patient states he has been following up with vascular surgery who has been watching his lower extremities.  They state today that the legs a.  More swollen and continued to be red despite a recent round of Bactrim.  In their note they also stated that the patient was experiencing some shortness of breath.  I discussed this with the patient who states he is always short of breath no worse today than his typical.  Patient denies any recent fevers denies any increased leg discomfort.  Patient does state he believes he has gained some weight over the past several weeks.  Denies any cough.   Past Medical History:  Diagnosis Date  . CHF (congestive heart failure) ([place])   . Depression   . 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 Gastroparesis   . Graves disease   . Hypercholesterolemia   . Hypertension   . Mantle cell lymphoma ([place]) 07/2014  . Peripheral neuropathy     Patient Active Problem List   Diagnosis Date Noted  . Acquired absence of kidney 09/13/2019  . Edema of lower extremity 09/13/2019  . 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 Proteinuria 09/13/2019  . Lower extremity pain, bilateral   . Diabetic foot ulcer associated with type 1 diabetes mellitus ([place])   . Acute on chronic systolic CHF (congestive heart failure) ([place])   . Cellulitis 08/18/2019   . Lower limb ulcer, calf ([place]) 08/01/2019  . Left hip pain 07/16/2019  . Pain due to onychomycosis of toenails of both feet 04/03/2019  . Swelling of limb 02/07/2019  . Pancytopenia ([place]) 12/03/2018  . CKD (chronic kidney disease) stage 3, GFR 30-59 ml/min 09/16/2018  . Left shoulder pain 09/16/2018  . Chronic cholecystitis 12/29/2017  . Graves disease 12/29/2017  . Peripheral neuropathy 12/29/2017  . S/p nephrectomy 12/29/2017  . 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 Symptomatic bradycardia 12/02/2017  . AKI (acute kidney injury) ([place]) 12/02/2017  . Chronic systolic CHF (congestive heart failure) ([place]) 12/02/2017  . Saturday night paralysis 11/12/2017  . Hypoglycemia 10/28/2017  . Goals of care, counseling/discussion 10/24/2017  . Lymphedema 10/20/2017  . Iron deficiency anemia 09/11/2017  . Fall 08/13/2017  . Humerus fracture 08/13/2017  . Anemia 01/12/2017  . Bilateral carotid artery stenosis 06/29/2016  . Hand laceration 12/16/2015  . Adjustment disorder with depressed mood 08/11/2015  . Cardiomyopathy, idiopathic ([place]) 04/22/2015  . CAD in native artery 11/02/2014  . Mantle cell lymphoma ([place]) 08/03/2014  . History of colonic polyps 04/29/2014  . Irregular heart beat 04/22/2014  . SOB (shortness of breath) 04/22/2014  . Stress 03/21/2014  . PVC (premature ventricular contraction) 02/21/2014  . GERD (gastroesophageal reflux disease) 10/23/2013  . Gastroparesis 06/25/2013  . Chest pain 04/13/2013  . Thyroid disease 04/13/2013  . Diabetes ([place]) 04/13/2013  . Essential hypertension, benign 04/13/2013  . Hypercholesterolemia 04/13/2013    Past Surgical History:  Procedure Laterality Date  . CHOLECYSTECTOMY    . NEPHRECTOMY     right  . PACEMAKER INSERTION N/A 12/07/2017   Procedure: INSERTION PACEMAKER DUAL CHAMBER INITIAL INSERT;  Surgeon: Isaias Cowman, MD;  Location: [facility];  Service: Cardiovascular;  Laterality: N/A;  . PORTA CATH INSERTION N/A  11/03/2017   Procedure: PORTA CATH INSERTION;  Surgeon: Katha Cabal, MD;  Location: [facility];  Service: Cardiovascular;  Laterality: N/A;    Prior to Admission medications   Medication Sig Start Date End Date Taking? Authorizing Provider  ACCU-CHEK AVIVA PLUS test strip USE AS INSTRUCTED. USE 1 STRIP EVERY 3 HOURS . ACCU-CHECK AVIVA PLUS TEST STRIP. DX O35.009 01/06/17   [provider]  acetaminophen (TYLENOL) 500 MG tablet Take 500 mg by mouth every 6 (six) hours as needed.     [provider]  amoxicillin-clavulanate (AUGMENTIN) 875-125 MG tablet Take 1 tablet by mouth 2 (two) times daily. 10/18/19   Kris Hartmann, NP  carvedilol (COREG) 3.125 MG tablet Take 3.125 mg by mouth 2 (two) times daily with a meal.  10/07/17 08/31/19  [provider]  cephALEXin (KEFLEX) 250 MG capsule Take 1 capsule (250 mg total) by mouth 2 (two) times daily. Patient not taking: Reported on 09/20/2019 08/21/19   Fritzi Mandes, MD  cephALEXin (KEFLEX) 500 MG capsule Take 1 capsule (500 mg total) by mouth 2 (two) times daily. 09/25/19   [name], DPM  cyanocobalamin (,VITAMIN B-12,) 1000 MCG/ML injection Inject into the muscle.    [provider]  ferrous sulfate 325 (65 FE) MG tablet Take 325 mg by mouth daily.    [provider]  furosemide (LASIX) 20 MG tablet Take 40 mg by mouth daily.     [provider]  GLUCAGON EMERGENCY 1 MG injection  11/30/17   [provider]  glucagon, human recombinant, (GLUCAGEN DIAGNOSTIC) 1 MG injection Use as directed 11/30/17   [provider]  glucose 4 GM chewable tablet Chew 1 tablet by mouth once as needed for low blood sugar.    [provider]  glucose blood (PRECISION QID TEST) test strip Use 8 (eight) times daily as directed. BAYER CONTOUR NEXT TEST STRIPS E10.649 03/03/18   [provider]  insulin aspart (NOVOLOG) 100 UNIT/ML injection Basal rate 12am 0.8 units per  hour, 9am 0.9 units per hour. (total basal insulin 20.7 units). Carbohydrate ratio 1 units for every 8gm of carbohydrate. Correction factor 1 units for every 50mg /dl over target cbg. Target CBG 80-120 10/29/17   Loletha Grayer, MD  levothyroxine (SYNTHROID) 25 MCG tablet Take by mouth. 03/29/19   [provider]  Multiple Vitamin (MULTIVITAMIN) tablet Take 1 tablet by mouth 2 (two) times daily.     [provider]  mupirocin ointment (BACTROBAN) 2 % mupirocin 2 % topical ointment    [provider]  pravastatin (PRAVACHOL) 40 MG tablet Take 40 mg by mouth daily.  03/29/19   [provider]  prochlorperazine (COMPAZINE) 10 MG tablet TAKE 1 TABLET (10 MG TOTAL) BY MOUTH EVERY 6 (SIX) HOURS AS NEEDED (NAUSEA OR VOMITING). 10/24/17   [provider]  spironolactone (ALDACTONE) 25 MG tablet Take 25 mg by mouth daily.    [provider]    Allergies  Allergen Reactions  . Rofecoxib Nausea Only    Family History  Problem Relation Age of Onset  . Breast cancer Mother   . Diabetes Father   . Cancer Father  Lymphoma  . Alcohol abuse Maternal Uncle   . Diabetes Sister   . Heart disease Other        grandfather    Social History Social History   Tobacco Use  . Smoking status: Former Research scientist (life sciences)  . Smokeless tobacco: Current User    Types: Chew  Substance Use Topics  . Alcohol use: Yes    Alcohol/week: 3.0 standard drinks    Types: 3 Cans of beer per week    Comment: nightly  . Drug use: Never    Review of Systems Constitutional: Negative for fever. Cardiovascular: Negative for chest pain. Respiratory: Negative for shortness of breath. Gastrointestinal: Negative for abdominal pain Musculoskeletal: Swelling of the lower extremities Skin: Redness of the lower extremities Neurological: Negative for headache All other ROS negative  ____________________________________________   PHYSICAL EXAM:  VITAL SIGNS: ED Triage Vitals   Enc Vitals Group     BP 10/23/19 1355 130/66     Pulse Rate 10/23/19 1355 97     Resp 10/23/19 1355 20     Temp 10/23/19 1355 98 F (36.7 C)     Temp Source 10/23/19 1355 Oral     SpO2 10/23/19 1355 99 %     Weight 10/23/19 1357 288 lb (130.6 kg)     Height 10/23/19 1357 5\' 10"  (1.778 m)     Head Circumference --      Peak Flow --      Pain Score 10/23/19 1356 2     Pain Loc --      Pain Edu? --      Excl. in [place]? --     Constitutional: Alert and oriented. Well appearing and in no distress. Eyes: Normal exam ENT      Head: Normocephalic and atraumatic      Mouth/Throat: Mucous membranes are moist. Cardiovascular: Normal rate, regular rhythm. Respiratory: Normal respiratory effort without tachypnea nor retractions. Breath sounds are clear  Gastrointestinal: Soft and nontender. No distention.   Musculoskeletal: Patient has bilateral 4+ lower extremity edema with mild to moderate erythema bilaterally several small areas of weeping.  When I unwrapped the lower extremities, patient states they appear fairly baseline to him. Neurologic:  Normal speech and language. No gross focal neurologic deficits  Skin:  Skin is warm.  Some erythema the lower extremities with some mild weepage. Psychiatric: Mood and affect are normal.  ____________________________________________    INITIAL IMPRESSION / ASSESSMENT AND PLAN / ED COURSE  Pertinent labs & imaging results that were available during my care of the patient were reviewed by me and considered in my medical decision making (see chart for details).   Patient presents emergency department for evaluation of lower extreme edema.  Overall the patient appears well, denies any increased pain denies any increase shortness of breath, vital signs are reassuring including afebrile and overall normal vitals.  Patient's labs show a normal white blood cell count, largely baseline labs for the patient.  Patient takes 20 mg Lasix 1 tablet twice daily.  I  believe the patient would benefit from increasing his Lasix for the next 7 days.  Patient recently completed a course of Bactrim, lower extremities are still erythematous which could be a result of the significant edema but also could be concerning for an underlying infection such as Pseudomonas.  Unable to obtain a chest x-ray as the patient has insulin pump and is refusing to remove it for the x-ray.  States that shortness of breath is no worse than baseline.  Patient satting 99% on room air we will hold off on chest x-ray at this time.  Patient has received IV Lasix as well as IV Levaquin.  We will discharge a short course of Levaquin given the need to cover for possible Pseudomonas Levaquin seems to be the most reasonable antibiotic despite his black box warning.  We will increase the patient's Lasix from 20 mg twice daily to 40 mg twice daily for the next 7 days have the patient follow-up in the next several days with his doctor for recheck/reevaluation.  Gary [name] was evaluated in Emergency Department on 10/23/2019 for the symptoms described in the history of present illness. He was evaluated in the context of the global COVID-19 pandemic, which necessitated consideration that the patient might be at risk for infection with the SARS-CoV-2 virus that causes COVID-19. Institutional protocols and algorithms that pertain to the evaluation of patients at risk for COVID-19 are in a state of rapid change based on information released by regulatory bodies including the CDC and federal and state organizations. These policies and algorithms were followed during the patient's care in the ED.  ____________________________________________   FINAL CLINICAL IMPRESSION(S) / ED DIAGNOSES  Cellulitis Peripheral edema   Harvest Dark, MD 10/23/19 1730

## 2019-10-23 NOTE — ED Triage Notes (Signed)
States has had swollen, reddened weeping legs bilaterally x 2 years. States has had periods that he needed antibiotics. States saw "vein doctor" today and they told him to come to the hospital for possible antibiotics for same.

## 2019-10-23 NOTE — ED Notes (Signed)
Pt has significant redness and edema in bilateral LE. Pt has multiple open areas on lower legs. Pt states that this has been going on and off for the past 2 years.

## 2019-10-23 NOTE — ED Notes (Signed)
Pt legs wrapped using xeroform, gauze, and Co-band dressing.

## 2019-10-23 NOTE — Discharge Instructions (Addendum)
As we discussed please increase your Lasix from 20 mg twice daily to 40 mg twice daily for the next 7 days.  Please follow-up with your doctor in approximately 3 to 4 days for recheck/reevaluation.  Return to the emergency department for any worsening swelling, or redness of the legs, for any trouble breathing, fever or any other symptom personally concerning to yourself.

## 2019-10-25 ENCOUNTER — Encounter (INDEPENDENT_AMBULATORY_CARE_PROVIDER_SITE_OTHER): Payer: Medicare Other

## 2019-10-26 DIAGNOSIS — E785 Hyperlipidemia, unspecified: Secondary | ICD-10-CM | POA: Diagnosis not present

## 2019-10-26 DIAGNOSIS — E1069 Type 1 diabetes mellitus with other specified complication: Secondary | ICD-10-CM | POA: Diagnosis not present

## 2019-10-26 DIAGNOSIS — E10649 Type 1 diabetes mellitus with hypoglycemia without coma: Secondary | ICD-10-CM | POA: Diagnosis not present

## 2019-10-26 DIAGNOSIS — I10 Essential (primary) hypertension: Secondary | ICD-10-CM | POA: Diagnosis not present

## 2019-10-26 DIAGNOSIS — E1159 Type 2 diabetes mellitus with other circulatory complications: Secondary | ICD-10-CM | POA: Diagnosis not present

## 2019-10-27 ENCOUNTER — Other Ambulatory Visit: Payer: Self-pay

## 2019-10-27 ENCOUNTER — Ambulatory Visit (INDEPENDENT_AMBULATORY_CARE_PROVIDER_SITE_OTHER): Payer: Medicare Other | Admitting: Nurse Practitioner

## 2019-10-27 VITALS — BP 134/74 | HR 65 | Resp 14 | Ht 70.0 in | Wt 286.0 lb

## 2019-10-27 DIAGNOSIS — I890 Lymphedema, not elsewhere classified: Secondary | ICD-10-CM

## 2019-10-27 NOTE — Progress Notes (Signed)
History of Present Illness  There is no documented history at this time  Assessments & Plan   There are no diagnoses linked to this encounter.    Additional instructions  Subjective:  Patient presents with venous ulcer of the Bilateral lower extremity.    Procedure:  3 layer unna wrap was placed Bilateral lower extremity.   Plan:   Follow up in one week.  

## 2019-11-01 ENCOUNTER — Ambulatory Visit (INDEPENDENT_AMBULATORY_CARE_PROVIDER_SITE_OTHER): Payer: Medicare Other | Admitting: Nurse Practitioner

## 2019-11-01 ENCOUNTER — Encounter (INDEPENDENT_AMBULATORY_CARE_PROVIDER_SITE_OTHER): Payer: Self-pay

## 2019-11-01 ENCOUNTER — Other Ambulatory Visit: Payer: Self-pay

## 2019-11-01 VITALS — BP 135/75 | HR 101 | Resp 16 | Wt 283.4 lb

## 2019-11-01 DIAGNOSIS — I890 Lymphedema, not elsewhere classified: Secondary | ICD-10-CM

## 2019-11-01 NOTE — Progress Notes (Signed)
History of Present Illness  There is no documented history at this time  Assessments & Plan   There are no diagnoses linked to this encounter.    Additional instructions  Subjective:  Patient presents with venous ulcer of the Bilateral lower extremity.    Procedure:  3 layer unna wrap was placed Bilateral lower extremity.   Plan:   Follow up in one week.  

## 2019-11-08 ENCOUNTER — Ambulatory Visit (INDEPENDENT_AMBULATORY_CARE_PROVIDER_SITE_OTHER): Payer: Medicare Other | Admitting: Nurse Practitioner

## 2019-11-08 ENCOUNTER — Other Ambulatory Visit: Payer: Self-pay

## 2019-11-08 VITALS — BP 118/70 | HR 111 | Resp 18 | Ht 70.0 in | Wt 288.0 lb

## 2019-11-08 DIAGNOSIS — L97201 Non-pressure chronic ulcer of unspecified calf limited to breakdown of skin: Secondary | ICD-10-CM | POA: Diagnosis not present

## 2019-11-08 NOTE — Progress Notes (Signed)
History of Present Illness  There is no documented history at this time  Assessments & Plan   There are no diagnoses linked to this encounter.    Additional instructions  Subjective:  Patient presents with venous ulcer of the Bilateral lower extremity.    Procedure:  3 layer unna wrap was placed Bilateral lower extremity.   Plan:   Follow up in one week.  

## 2019-11-14 ENCOUNTER — Ambulatory Visit: Payer: Medicare Other | Admitting: Internal Medicine

## 2019-11-15 ENCOUNTER — Other Ambulatory Visit: Payer: Self-pay

## 2019-11-15 ENCOUNTER — Ambulatory Visit (INDEPENDENT_AMBULATORY_CARE_PROVIDER_SITE_OTHER): Payer: Medicare Other | Admitting: Nurse Practitioner

## 2019-11-15 ENCOUNTER — Encounter: Payer: Self-pay | Admitting: Internal Medicine

## 2019-11-15 ENCOUNTER — Ambulatory Visit (INDEPENDENT_AMBULATORY_CARE_PROVIDER_SITE_OTHER): Payer: Medicare Other | Admitting: Internal Medicine

## 2019-11-15 VITALS — BP 160/73 | HR 114 | Resp 18 | Ht 70.0 in | Wt 297.0 lb

## 2019-11-15 DIAGNOSIS — I5022 Chronic systolic (congestive) heart failure: Secondary | ICD-10-CM

## 2019-11-15 DIAGNOSIS — D61818 Other pancytopenia: Secondary | ICD-10-CM

## 2019-11-15 DIAGNOSIS — W19XXXA Unspecified fall, initial encounter: Secondary | ICD-10-CM | POA: Diagnosis not present

## 2019-11-15 DIAGNOSIS — R600 Localized edema: Secondary | ICD-10-CM | POA: Diagnosis not present

## 2019-11-15 DIAGNOSIS — I890 Lymphedema, not elsewhere classified: Secondary | ICD-10-CM

## 2019-11-15 DIAGNOSIS — E7800 Pure hypercholesterolemia, unspecified: Secondary | ICD-10-CM

## 2019-11-15 DIAGNOSIS — I10 Essential (primary) hypertension: Secondary | ICD-10-CM | POA: Diagnosis not present

## 2019-11-15 DIAGNOSIS — I2510 Atherosclerotic heart disease of native coronary artery without angina pectoris: Secondary | ICD-10-CM

## 2019-11-15 DIAGNOSIS — E079 Disorder of thyroid, unspecified: Secondary | ICD-10-CM

## 2019-11-15 DIAGNOSIS — C8318 Mantle cell lymphoma, lymph nodes of multiple sites: Secondary | ICD-10-CM | POA: Diagnosis not present

## 2019-11-15 DIAGNOSIS — E1151 Type 2 diabetes mellitus with diabetic peripheral angiopathy without gangrene: Secondary | ICD-10-CM

## 2019-11-15 DIAGNOSIS — I429 Cardiomyopathy, unspecified: Secondary | ICD-10-CM

## 2019-11-15 DIAGNOSIS — D649 Anemia, unspecified: Secondary | ICD-10-CM | POA: Diagnosis not present

## 2019-11-15 DIAGNOSIS — N1832 Chronic kidney disease, stage 3b: Secondary | ICD-10-CM

## 2019-11-15 DIAGNOSIS — I428 Other cardiomyopathies: Secondary | ICD-10-CM

## 2019-11-15 NOTE — Progress Notes (Signed)
History of Present Illness  There is no documented history at this time  Assessments & Plan   There are no diagnoses linked to this encounter.    Additional instructions  Subjective:  Patient presents with venous ulcer of the Bilateral lower extremity.    Procedure:  3 layer unna wrap was placed Bilateral lower extremity.   Plan:   Follow up in one week.  

## 2019-11-15 NOTE — Progress Notes (Signed)
[name], male   DOB: 07/10/1946, 73 y.o.   MRN: [id number]   Virtual Visit via video Note  This visit type was conducted due to national recommendations for restrictions regarding the COVID-19 pandemic (e.g. social distancing).  This format is felt to be most appropriate for this patient at this time.  All issues noted in this document were discussed and addressed.  No physical exam was performed (except for noted visual exam findings with Video Visits).   I connected with Orlie Mcdade by a video enabled telemedicine application or telephone and verified that I am speaking with the correct person using two identifiers. Location patient: home Location provider: work Persons participating in the virtual visit: patient, provider  The limitations, risks, security and privacy concerns of performing an evaluation and management service by video and the availability of in person appointments have been discussed. The patient expressed understanding and agreed to proceed.   Reason for visit: scheduled follow up.    HPI: He reports his legs are doing some better.  Has been having them wrapped.  Followed by vascular surgery.  Has f/u today.  Breathing is relatively stable.  Some better. Sees nephrology.  Lasix increased. Taking 40mg.  Feels has helped.  Still get sob with exertion, but does feel is some better.  Followed by cardiology.  Had recommended cardiac rehab.  States sugars are up and down.  Sees endocrinology.  Discussed not overshooting with the insulin he sefl adjusts.  Blood pressure doing well.  Had a fall recently.  Landed on his shoulder.  No head injury. Able to raise his arm without significant pain.  Does not feel needs any further intervention.  Handling stress.     ROS: See pertinent positives and negatives per HPI.  Past Medical History:  Diagnosis Date  . CHF (congestive heart failure) (HCC)   . Depression   . 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 Gastroparesis   . Graves disease   . Hypercholesterolemia   . Hypertension   . Mantle cell lymphoma (HCC) 07/2014  . Peripheral neuropathy     Past Surgical History:  Procedure Laterality Date  . CHOLECYSTECTOMY    . NEPHRECTOMY     right  . PACEMAKER INSERTION N/A 12/07/2017   Procedure: INSERTION PACEMAKER DUAL CHAMBER INITIAL INSERT;  Surgeon: Paraschos, Alexander, MD;  Location: [facility] ORS;  Service: Cardiovascular;  Laterality: N/A;  . PORTA CATH INSERTION N/A 11/03/2017   Procedure: PORTA CATH INSERTION;  Surgeon: Schnier, Gregory G, MD;  Location: [facility] INVASIVE [facility];  Service: Cardiovascular;  Laterality: N/A;    Family History  Problem Relation Age of Onset  . Breast cancer Mother   . Diabetes Father   . Cancer Father        Lymphoma  . Alcohol abuse Maternal Uncle   . Diabetes Sister   . Heart disease Other        grandfather    SOCIAL HX: reviewed.    Current Outpatient Medications:  .  ACCU-CHEK AVIVA PLUS test strip, USE AS INSTRUCTED. USE 1 STRIP EVERY 3 HOURS . ACCU-CHECK AVIVA PLUS TEST STRIP. DX E10.649, Disp: , Rfl: 12 .  acetaminophen (TYLENOL) 500 MG tablet, Take 500 mg by mouth every 6 (six) hours as needed. , Disp: , Rfl:  .  amoxicillin-clavulanate (AUGMENTIN) 875-125 MG tablet, Take 1 tablet by mouth 2 (two) times daily., Disp: 14 tablet, Rfl: 0 .  carvedilol (  COREG) 3.125 MG tablet, Take 3.125 mg by mouth 2 (two) times daily with a meal. , Disp: , Rfl:  .  cephALEXin (KEFLEX) 250 MG capsule, Take 1 capsule (250 mg total) by mouth 2 (two) times daily. (Patient not taking: Reported on 09/20/2019), Disp: 12 capsule, Rfl: 0 .  cephALEXin (KEFLEX) 500 MG capsule, Take 1 capsule (500 mg total) by mouth 2 (two) times daily., Disp: 20 capsule, Rfl: 0 .  cyanocobalamin (,VITAMIN B-12,) 1000 MCG/ML injection, Inject into the muscle., Disp: , Rfl:  .  ferrous sulfate 325 (65 FE) MG tablet, Take 325 mg by mouth daily.,  Disp: , Rfl:  .  furosemide (LASIX) 20 MG tablet, Take 40 mg by mouth daily. , Disp: , Rfl:  .  GLUCAGON EMERGENCY 1 MG injection, , Disp: , Rfl:  .  glucagon, human recombinant, (GLUCAGEN DIAGNOSTIC) 1 MG injection, Use as directed, Disp: , Rfl:  .  glucose 4 GM chewable tablet, Chew 1 tablet by mouth once as needed for low blood sugar., Disp: , Rfl:  .  glucose blood (PRECISION QID TEST) test strip, Use 8 (eight) times daily as directed. BAYER CONTOUR NEXT TEST STRIPS E10.649, Disp: , Rfl:  .  insulin aspart (NOVOLOG) 100 UNIT/ML injection, Basal rate 12am 0.8 units per hour, 9am 0.9 units per hour. (total basal insulin 20.7 units). Carbohydrate ratio 1 units for every 8gm of carbohydrate. Correction factor 1 units for every 68m/dl over target cbg. Target CBG 80-120, Disp: 1 vial, Rfl: 12 .  levothyroxine (SYNTHROID) 25 MCG tablet, Take by mouth., Disp: , Rfl:  .  Multiple Vitamin (MULTIVITAMIN) tablet, Take 1 tablet by mouth 2 (two) times daily. , Disp: , Rfl:  .  mupirocin ointment (BACTROBAN) 2 %, mupirocin 2 % topical ointment, Disp: , Rfl:  .  pravastatin (PRAVACHOL) 40 MG tablet, Take 40 mg by mouth daily. , Disp: , Rfl:  .  prochlorperazine (COMPAZINE) 10 MG tablet, TAKE 1 TABLET (10 MG TOTAL) BY MOUTH EVERY 6 (SIX) HOURS AS NEEDED (NAUSEA OR VOMITING)., Disp: , Rfl:  .  spironolactone (ALDACTONE) 25 MG tablet, Take 25 mg by mouth daily., Disp: , Rfl:  No current facility-administered medications for this visit.  Facility-Administered Medications Ordered in Other Visits:  .  heparin lock flush 100 unit/mL, 500 Units, Intravenous, Once, Finnegan, TKathlene November MD .  sodium chloride flush (NS) 0.9 % injection 10 mL, 10 mL, Intravenous, PRN, FLloyd Huger MD, 10 mL at 02/01/18 0829 .  sodium chloride flush (NS) 0.9 % injection 10 mL, 10 mL, Intravenous, PRN, FGrayland Ormond TKathlene November MD, 10 mL at 04/05/18 0800  EXAM:  VITALS per patient if applicable: 1253/66-44 GENERAL: alert,  oriented, appears well and in no acute distress  HEENT: atraumatic, conjunttiva clear, no obvious abnormalities on inspection of external nose and ears  NECK: normal movements of the head and neck  LUNGS: on inspection no signs of respiratory distress, breathing rate appears normal, no obvious gross SOB, gasping or wheezing  CV: no obvious cyanosis  PSYCH/NEURO: pleasant and cooperative, no obvious depression or anxiety, speech and thought processing grossly intact  ASSESSMENT AND PLAN:  Discussed the following assessment and plan:  Anemia Follow cbc.   Card[place]omyopathy, [place] by cardi[place]. On lasix 467mnow.  Breathing is some better.  Still with some sob with exertion, but some better.  Follow.    CAD in native artery Continue risk factor modification.  Followed by cardiology.   Chronic systolic CHF (  congestive heart failure) (HCC) Followed by cardiology.  Breathing some better on lasix 40mg.    CKD (chronic kidney disease) stage 3, GFR 30-59 ml/min Followed by nephrology.  Avoid antiinflammatories.  Follow metabolic panel.   Diabetes (HCC) Low carb diet and exercise.  Follow met b and a1c.  Followed by endocrinology.  Discussed the need to not overshoot when adjust short acting insulin with his meals.    Edema of lower extremity Followed by [facility].  Seeing them regularly - wraps.    Essential hypertension, benign Blood pressure under good control.  Continue same medication regimen.  Follow pressures.  Follow metabolic panel.    Fall Recent fall.  Injured shoulder.  Good rom.  No significant pain now.  No head injury.  Discussed slow position changes and movements.  Walker.  Follow. Desires no further intervention.   Hypercholesterolemia On pravastatin.  Low cholesterol diet and exercise.  Follow lipid panel and liver function tests.    Mantle cell lymphoma (HCC) Followed by oncology.   Pancytopenia (HCC) Followed by oncology.   Thyroid  disease Followed by endocrinology.     I discussed the assessment and treatment plan with the patient. The patient was provided an opportunity to ask questions and all were answered. The patient agreed with the plan and demonstrated an understanding of the instructions.   The patient was advised to call back or seek an in-person evaluation if the symptoms worsen or if the condition fails to improve as anticipated.    , MD  

## 2019-11-19 ENCOUNTER — Encounter: Payer: Self-pay | Admitting: Internal Medicine

## 2019-11-19 NOTE — Assessment & Plan Note (Signed)
Followed by endocrinology 

## 2019-11-19 NOTE — Assessment & Plan Note (Signed)
Followed by cardiology.  Breathing some better on lasix 40mg .

## 2019-11-19 NOTE — Assessment & Plan Note (Signed)
Followed by oncology 

## 2019-11-19 NOTE — Assessment & Plan Note (Signed)
Continue risk factor modification.  Followed by cardiology.  

## 2019-11-19 NOTE — Assessment & Plan Note (Signed)
On pravastatin.  Low cholesterol diet and exercise.  Follow lipid panel and liver function tests.   

## 2019-11-19 NOTE — Assessment & Plan Note (Signed)
Low carb diet and exercise.  Follow met b and a1c.  Followed by endocrinology.  Discussed the need to not overshoot when adjust short acting insulin with his meals.

## 2019-11-19 NOTE — Assessment & Plan Note (Signed)
Blood pressure under good control.  Continue same medication regimen.  Follow pressures.  Follow metabolic panel.   

## 2019-11-19 NOTE — Assessment & Plan Note (Signed)
Followed by cardiology. On lasix 40mg  now.  Breathing is some better.  Still with some sob with exertion, but some better.  Follow.

## 2019-11-19 NOTE — Assessment & Plan Note (Signed)
Followed by [facility].  Seeing them regularly - wraps.

## 2019-11-19 NOTE — Assessment & Plan Note (Signed)
Recent fall.  Injured shoulder.  Good rom.  No significant pain now.  No head injury.  Discussed slow position changes and movements.  Walker.  Follow. Desires no further intervention.

## 2019-11-19 NOTE — Assessment & Plan Note (Signed)
Followed by nephrology.  Avoid antiinflammatories.  Follow metabolic panel.  

## 2019-11-19 NOTE — Assessment & Plan Note (Signed)
Follow [facility].  

## 2019-11-21 ENCOUNTER — Encounter (INDEPENDENT_AMBULATORY_CARE_PROVIDER_SITE_OTHER): Payer: Self-pay | Admitting: Nurse Practitioner

## 2019-11-21 ENCOUNTER — Other Ambulatory Visit: Payer: Self-pay

## 2019-11-21 ENCOUNTER — Ambulatory Visit (INDEPENDENT_AMBULATORY_CARE_PROVIDER_SITE_OTHER): Payer: Medicare Other | Admitting: Nurse Practitioner

## 2019-11-21 VITALS — BP 152/68 | HR 120 | Resp 18 | Wt 297.0 lb

## 2019-11-21 DIAGNOSIS — I10 Essential (primary) hypertension: Secondary | ICD-10-CM

## 2019-11-21 DIAGNOSIS — L03119 Cellulitis of unspecified part of limb: Secondary | ICD-10-CM

## 2019-11-21 DIAGNOSIS — I890 Lymphedema, not elsewhere classified: Secondary | ICD-10-CM

## 2019-11-23 ENCOUNTER — Encounter (INDEPENDENT_AMBULATORY_CARE_PROVIDER_SITE_OTHER): Payer: Self-pay | Admitting: Nurse Practitioner

## 2019-11-23 NOTE — Progress Notes (Signed)
SUBJECTIVE:  [name], male    DOB: Nov 29, 1945, 75 y.o.   MRN: [id number] Chief Complaint  Patient presents with  . Follow-up    unna check     HPI  Gary [name] is a 74 y.o. male that presents today for evaluation of his bilateral Unna wraps.  Several weeks ago the patient was sent to the emergency room due to cellulitis and worsening shortness of breath.  Today the patient's legs look much better and the majority of his ulcerations have healed.  There is a small area on his left foot.  The patient is also significantly less short of breath.  The patient's diuretic dosage was changed in the emergency room and he has been doing well since then.  He states that he is able to recover from being short of breath much faster than he was previously.  He denies any fever, chills, nausea, vomiting or diarrhea.  Past Medical History:  Diagnosis Date  . CHF (congestive heart failure) ([place])   . Depression   . 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 Gastroparesis   . Graves disease   . Hypercholesterolemia   . Hypertension   . Mantle cell lymphoma ([place]) 07/2014  . Peripheral neuropathy     Past Surgical History:  Procedure Laterality Date  . CHOLECYSTECTOMY    . NEPHRECTOMY     right  . PACEMAKER INSERTION N/A 12/07/2017   Procedure: INSERTION PACEMAKER DUAL CHAMBER INITIAL INSERT;  Surgeon: Isaias Cowman, MD;  Location: [facility] OR[facility];  Service: Cardiovascular;  Laterality: N/A;  . PORTA CATH INSERTION N/A 11/03/2017   Procedure: PORTA CATH INSERTION;  Surgeon: Katha Cabal, MD;  Location: [place] [facility];  Service: Cardiovascular;  Laterality: N/A;    Social History   Socioeconomic History  . Marital status: Married    Spouse name: Not on file  . Number of children: 1  . Years of education: Not on file  . Highest education level: Not on file  Occupational History  . Not on file  Tobacco Use  .  Smoking status: Former Research scientist (life sciences)  . Smokeless tobacco: Current User    Types: Chew  Substance and Sexual Activity  . Alcohol use: Yes    Alcohol/week: 3.0 standard drinks    Types: 3 Cans of beer per week    Comment: nightly  . Drug use: Never  . Sexual activity: Not Currently  Other Topics Concern  . Not on file  Social History Narrative  . Not on file   Social Determinants of Health   Financial Resource Strain: Unknown  . Difficulty of Paying Living Expenses: Patient refused  Food Insecurity: Unknown  . Worried About Charity fundraiser in the Last Year: Patient refused  . Ran Out of Food in the Last Year: Patient refused  Transportation Needs: No Transportation Needs  . Lack of Transportation (Medical): No  . Lack of Transportation (Non-Medical): No  Physical Activity: Inactive  . Days of Exercise per Week: 0 days  . Minutes of Exercise per Session: 0 min  Stress: No Stress Concern Present  . Feeling of Stress : Not at all  Social Connections: Unknown  . Frequency of Communication with Friends and Family: Patient refused  . Frequency of Social Gatherings with Friends and Family: Patient refused  . Attends Religious Services: Patient refused  . Active Member of Clubs or Organizations: Patient refused  . Attends Club  or Organization Meetings: Patient refused  . Marital Status: Patient refused  Intimate Partner Violence: Unknown  . Fear of Current or Ex-Partner: Patient refused  . Emotionally Abused: Patient refused  . Physically Abused: Patient refused  . Sexually Abused: Patient refused    Family History  Problem Relation Age of Onset  . Breast cancer Mother   . Diabetes Father   . Cancer Father        Lymphoma  . Alcohol abuse Maternal Uncle   . Diabetes Sister   . Heart disease Other        grandfather    Allergies  Allergen Reactions  . Rofecoxib Nausea Only     Review of Systems   Review of Systems: Negative Unless Checked Constitutional: [] Weight  loss  [] Fever  [] Chills Cardiac: [] Chest pain   []  Atrial Fibrillation  [] Palpitations   [] Shortness of breath when laying flat   [] Shortness of breath with exertion. [] Shortness of breath at rest Vascular:  [] Pain in legs with walking   [] Pain in legs with standing [] Pain in legs when laying flat   [] Claudication    [] Pain in feet when laying flat    [] History of DVT   [] Phlebitis   [x] Swelling in legs   [x] Varicose veins   [] Non-healing ulcers Pulmonary:   [] Uses home oxygen   [] Productive cough   [] Hemoptysis   [] Wheeze  [] COPD   [] Asthma Neurologic:  [] Dizziness   [] Seizures  [] Blackouts [] History of stroke   [] History of TIA  [] Aphasia   [] Temporary Blindness   [] Weakness or numbness in arm   [] Weakness or numbness in leg Musculoskeletal:   [] Joint swelling   [] Joint pain   [] Low back pain  []  History of Knee Replacement [x] Arthritis [] back Surgeries  []  Spinal Stenosis    Hematologic:  [] Easy bruising  [] Easy bleeding   [] Hypercoagulable state   [x] Anemic Gastrointestinal:  [] Diarrhea   [] Vomiting  [] Gastroesophageal reflux/heartburn   [] Difficulty swallowing. [] Abdominal pain Genitourinary:  [x] Chronic kidney disease   [] Difficult urination  [] Anuric   [] Blood in urine [] Frequent urination  [] Burning with urination   [] Hematuria Skin:  [] Rashes   [] Ulcers [x] Wounds Psychological:  [] History of anxiety   []  History of major depression  []  Memory Difficulties      OBJECTIVE:   Physical Exam  BP (!) 152/68 (BP Location: Right Arm)   Pulse (!) 120   Resp 18   Wt 297 lb (134.7 kg)   BMI 42.62 kg/m   Gen: WD/WN, NAD Head: [place]/AT, No temporalis wasting.  Ear/Nose/Throat: Hearing grossly intact, nares w/o erythema or drainage Eyes: PER, EOMI, sclera nonicteric.  Neck: Supple, no masses.  No JVD.  Pulmonary:  Good air movement, no use of accessory muscles.  Cardiac: RRR Vascular:  Healing left foot blister Vessel Right Left  Radial Palpable Palpable  Brachial Palpable Palpable    Femoral Palpable Palpable  Popliteal Palpable Palpable  Dorsalis Pedis Palpable Palpable  Posterior Tibial Palpable Palpable   Gastrointestinal: soft, non-distended. No guarding/no peritoneal signs.  Musculoskeletal: Ambulates with cane. No deformity or atrophy.  Neurologic: Pain and light touch intact in extremities.  Symmetrical.  Speech is fluent. Motor exam as listed above. Psychiatric: Judgment intact, Mood & affect appropriate for pt's clinical situation. Dermatologic:  Stasis dermatitis bilaterally. No changes consistent with cellulitis. Lymph : No Cervical lymphadenopathy, dermal thickening       ASSESSMENT AND PLAN:  1. Lymphedema We will continue to have the patient placed in bilateral Unna wraps.  The  patient will present to the office on a weekly basis for Unna wrap changes.  We will reevaluate his progression in 4 weeks.  Patient is advised to contact the office if his interrupts get wet or he begins to experience weeping or soaking through of the wounds to have them replaced as wet Unna wraps increase the chances of cellulitis exponentially.  2. Essential hypertension, benign Continue antihypertensive medications as already ordered, these medications have been reviewed and there are no changes at this time.   3. Cellulitis of lower extremity, unspecified laterality Currently resolved   Current Outpatient Medications on File Prior to Visit  Medication Sig Dispense Refill  . ACCU-CHEK AVIVA PLUS test strip USE AS INSTRUCTED. USE 1 STRIP EVERY 3 HOURS . ACCU-CHECK AVIVA PLUS TEST STRIP. DX E10.649  12  . acetaminophen (TYLENOL) 500 MG tablet Take 500 mg by mouth every 6 (six) hours as needed.     Marland Kitchen amoxicillin-clavulanate (AUGMENTIN) 875-125 MG tablet Take 1 tablet by mouth 2 (two) times daily. 14 tablet 0  . cephALEXin (KEFLEX) 500 MG capsule Take 1 capsule (500 mg total) by mouth 2 (two) times daily. 20 capsule 0  . cyanocobalamin (,VITAMIN B-12,) 1000 MCG/ML injection  Inject into the muscle.    . ferrous sulfate 325 (65 FE) MG tablet Take 325 mg by mouth daily.    . furosemide (LASIX) 20 MG tablet Take 40 mg by mouth daily.     Marland Kitchen GLUCAGON EMERGENCY 1 MG injection     . glucagon, human recombinant, (GLUCAGEN DIAGNOSTIC) 1 MG injection Use as directed    . glucose 4 GM chewable tablet Chew 1 tablet by mouth once as needed for low blood sugar.    Marland Kitchen glucose blood (PRECISION QID TEST) test strip Use 8 (eight) times daily as directed. BAYER CONTOUR NEXT TEST STRIPS E10.649    . insulin aspart (NOVOLOG) 100 UNIT/ML injection Basal rate 12am 0.8 units per hour, 9am 0.9 units per hour. (total basal insulin 20.7 units). Carbohydrate ratio 1 units for every 8gm of carbohydrate. Correction factor 1 units for every 50mg /dl over target cbg. Target CBG 80-120 1 vial 12  . levothyroxine (SYNTHROID) 25 MCG tablet Take by mouth.    Marland Kitchen lisinopril (ZESTRIL) 20 MG tablet Take 20 mg by mouth daily.    . Multiple Vitamin (MULTIVITAMIN) tablet Take 1 tablet by mouth 2 (two) times daily.     . mupirocin ointment (BACTROBAN) 2 % mupirocin 2 % topical ointment    . pravastatin (PRAVACHOL) 40 MG tablet Take 40 mg by mouth daily.     . prochlorperazine (COMPAZINE) 10 MG tablet TAKE 1 TABLET (10 MG TOTAL) BY MOUTH EVERY 6 (SIX) HOURS AS NEEDED (NAUSEA OR VOMITING).    Marland Kitchen spironolactone (ALDACTONE) 25 MG tablet Take 25 mg by mouth daily.    . carvedilol (COREG) 3.125 MG tablet Take 3.125 mg by mouth 2 (two) times daily with a meal.     . cephALEXin (KEFLEX) 250 MG capsule Take 1 capsule (250 mg total) by mouth 2 (two) times daily. (Patient not taking: Reported on 09/20/2019) 12 capsule 0   Current Facility-Administered Medications on File Prior to Visit  Medication Dose Route Frequency Provider Last Rate Last Admin  . heparin lock flush 100 unit/mL  500 Units Intravenous Once Lloyd Huger, MD      . sodium chloride flush (NS) 0.9 % injection 10 mL  10 mL Intravenous PRN [name],  Kathlene November, MD   10 mL at  02/01/18 0829  . sodium chloride flush (NS) 0.9 % injection 10 mL  10 mL Intravenous PRN Lloyd Huger, MD   10 mL at 04/05/18 0800    There are no Patient Instructions on file for this visit. No follow-ups on file.   Kris Hartmann, NP  This note was completed with Sales executive.  Any errors are purely unintentional.

## 2019-11-27 ENCOUNTER — Encounter (INDEPENDENT_AMBULATORY_CARE_PROVIDER_SITE_OTHER): Payer: Self-pay | Admitting: Nurse Practitioner

## 2019-11-28 ENCOUNTER — Encounter (INDEPENDENT_AMBULATORY_CARE_PROVIDER_SITE_OTHER): Payer: Medicare Other

## 2019-11-29 ENCOUNTER — Other Ambulatory Visit: Payer: Self-pay

## 2019-11-29 ENCOUNTER — Ambulatory Visit (INDEPENDENT_AMBULATORY_CARE_PROVIDER_SITE_OTHER): Payer: Medicare Other | Admitting: Nurse Practitioner

## 2019-11-29 ENCOUNTER — Encounter (INDEPENDENT_AMBULATORY_CARE_PROVIDER_SITE_OTHER): Payer: Self-pay

## 2019-11-29 VITALS — BP 146/63 | HR 66 | Resp 18 | Wt 290.0 lb

## 2019-11-29 DIAGNOSIS — L97201 Non-pressure chronic ulcer of unspecified calf limited to breakdown of skin: Secondary | ICD-10-CM

## 2019-11-29 NOTE — Progress Notes (Signed)
History of Present Illness  There is no documented history at this time  Assessments & Plan   There are no diagnoses linked to this encounter.    Additional instructions  Subjective:  Patient presents with venous ulcer of the Bilateral lower extremity.    Procedure:  3 layer unna wrap was placed Bilateral lower extremity.   Plan:   Follow up in one week.  

## 2019-11-30 NOTE — Progress Notes (Signed)
[name], male    DOB: 01-24-1946, 74 y.o.   MRN: [id number]  HPI  Gary [name] is a 74 y/o male with a history of DM, hyperlipidemia, HTN, thyroid disease, depression, current chewing tobacco use and chronic heart failure.   Echo report from 08/20/2019 reviewed and showed an EF of 50-55% along with mild Gary/TR.   Was in the ED 10/23/19 due to cellulitis and peripheral edema. Given IV lasix/ antibiotics and then medications changed and he was released. Admitted 08/18/2019 due to bilateral lower extremity cellulitis-acute on chronic with h/o Chronic Lymphedema along with acute on chronic HF. Vascular surgery was consulted. Left leg ultrasound negative for DVT. Initially given IV antibiotics and then transitioned to oral antibiotics. Initially given IV lasix and then transitioned to oral diuretics.  Discharged after 3 days.   He presents today for a follow-up visit with a chief complaint of moderate shortness of breath upon minimal exertion. He describes this as chronic in nature having been present for several years. He has associated fatigue, pedal edema, light-headedness, difficulty sleeping, easy bruising and fluctuating weight along with this. He denies any abdominal distention, palpitations, chest pain or cough.   Getting legs wrapped weekly by vascular. Says that he's had his diuretic increased by nephrology because he was gaining weight.   Past Medical History:  Diagnosis Date  . CHF (congestive heart failure) ([place])   . Depression   . 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 Gastroparesis   . Graves disease   . Hypercholesterolemia   . Hypertension   . Mantle cell lymphoma ([place]) 07/2014  . Peripheral neuropathy    Past Surgical History:  Procedure Laterality Date  . CHOLECYSTECTOMY    . NEPHRECTOMY     right  . PACEMAKER INSERTION N/A 12/07/2017   Procedure: INSERTION PACEMAKER DUAL CHAMBER INITIAL INSERT;  Surgeon:  Isaias Cowman, MD;  Location: [facility] ORS;  Service: Cardiovascular;  Laterality: N/A;  . PORTA CATH INSERTION N/A 11/03/2017   Procedure: PORTA CATH INSERTION;  Surgeon: Katha Cabal, MD;  Location: [facility];  Service: Cardiovascular;  Laterality: N/A;   Family History  Problem Relation Age of Onset  . Breast cancer Mother   . Diabetes Father   . Cancer Father        Lymphoma  . Alcohol abuse Maternal Uncle   . Diabetes Sister   . Heart disease Other        grandfather   Social History   Tobacco Use  . Smoking status: Former Research scientist (life sciences)  . Smokeless tobacco: Current User    Types: Chew  Substance Use Topics  . Alcohol use: Yes    Alcohol/week: 3.0 standard drinks    Types: 3 Cans of beer per week    Comment: nightly   Allergies  Allergen Reactions  . Rofecoxib Nausea Only   Prior to Admission medications   Medication Sig Start Date End Date Taking? Authorizing Provider  ACCU-CHEK AVIVA PLUS test strip USE AS INSTRUCTED. USE 1 STRIP EVERY 3 HOURS . ACCU-CHECK AVIVA PLUS TEST STRIP. DX O35.009 01/06/17  Yes [provider]  acetaminophen (TYLENOL) 500 MG tablet Take 500 mg by mouth every 6 (six) hours as needed.    Yes [provider]  carvedilol (COREG) 3.125 MG tablet Take 3.125 mg by mouth 2 (two) times daily with a meal.  10/07/17 12/01/19 Yes [provider]  cyanocobalamin (,VITAMIN B-12,) 1000 MCG/ML  injection Inject into the muscle.   Yes [provider]  ferrous sulfate 325 (65 FE) MG tablet Take 325 mg by mouth daily.   Yes [provider]  furosemide (LASIX) 20 MG tablet Take 40 mg by mouth 2 (two) times daily.    Yes [provider]  GLUCAGON EMERGENCY 1 MG injection  11/30/17  Yes [provider]  glucagon, human recombinant, (GLUCAGEN DIAGNOSTIC) 1 MG injection Use as directed 11/30/17  Yes [provider]  glucose 4 GM chewable tablet Chew 1 tablet by mouth once as needed for low  blood sugar.   Yes [provider]  glucose blood (PRECISION QID TEST) test strip Use 8 (eight) times daily as directed. BAYER CONTOUR NEXT TEST STRIPS E10.649 03/03/18  Yes [provider]  insulin aspart (NOVOLOG) 100 UNIT/ML injection Basal rate 12am 0.8 units per hour, 9am 0.9 units per hour. (total basal insulin 20.7 units). Carbohydrate ratio 1 units for every 8gm of carbohydrate. Correction factor 1 units for every 50mg /dl over target cbg. Target CBG 80-120 10/29/17  Yes Wieting, Richard, MD  levothyroxine (SYNTHROID) 25 MCG tablet Take by mouth. 03/29/19  Yes [provider]  lisinopril (ZESTRIL) 20 MG tablet Take 20 mg by mouth daily. 09/25/19  Yes [provider]  Multiple Vitamin (MULTIVITAMIN) tablet Take 1 tablet by mouth 2 (two) times daily.    Yes [provider]  pravastatin (PRAVACHOL) 40 MG tablet Take 40 mg by mouth daily.  03/29/19  Yes [provider]  prochlorperazine (COMPAZINE) 10 MG tablet TAKE 1 TABLET (10 MG TOTAL) BY MOUTH EVERY 6 (SIX) HOURS AS NEEDED (NAUSEA OR VOMITING). 10/24/17  Yes [provider]  spironolactone (ALDACTONE) 25 MG tablet Take 25 mg by mouth daily.   Yes [provider]      Review of Systems  Constitutional: Positive for appetite change (up and down) and fatigue.  HENT: Negative for congestion, postnasal drip and sore throat.   Eyes: Negative.   Respiratory: Positive for shortness of breath (easily). Negative for cough.   Cardiovascular: Positive for leg swelling. Negative for chest pain and palpitations.  Gastrointestinal: Negative for abdominal distention and abdominal pain.  Endocrine: Negative.   Genitourinary: Negative.   Musculoskeletal: Positive for neck pain. Negative for back pain.  Skin: Negative.   Allergic/Immunologic: Negative.   Neurological: Positive for light-headedness (if stand too quickly). Negative for dizziness.  Hematological: Negative for adenopathy.  Bruises/bleeds easily.  Psychiatric/Behavioral: Positive for sleep disturbance (at times; sleeping in reclined). Negative for dysphoric mood. The patient is not nervous/anxious.    Vitals:   12/01/19 0953  BP: (!) 146/88  Pulse: (!) 110  Resp: 20  SpO2: 100%  Weight: 288 lb 6.4 oz (130.8 kg)  Height: 5\' 10"  (1.778 m)   Wt Readings from Last 3 Encounters:  12/01/19 288 lb 6.4 oz (130.8 kg)  11/29/19 290 lb (131.5 kg)  11/21/19 297 lb (134.7 kg)   Lab Results  Component Value Date   CREATININE 1.70 (H) 10/23/2019   CREATININE 1.56 (H) 08/29/2019   CREATININE 1.68 (H) 08/25/2019     Physical Exam Vitals and nursing note reviewed.  Constitutional:      Appearance: He is well-developed.  HENT:     Head: Normocephalic and atraumatic.  Neck:     Vascular: No JVD.  Cardiovascular:     Rate and Rhythm: Normal rate and regular rhythm.  Pulmonary:     Effort: Pulmonary effort is normal. No respiratory distress.  Breath sounds: No rhonchi or rales.  Abdominal:     Palpations: Abdomen is soft.     Tenderness: There is no abdominal tenderness.  Musculoskeletal:     Cervical back: Normal range of motion.     Right lower leg: Edema present.     Left lower leg: No tenderness. Edema present.  Skin:    General: Skin is warm and dry.     Findings: Ecchymosis (bilateral arms) present.  Neurological:     General: No focal deficit present.     Mental Status: He is alert and oriented to person, place, and time.  Psychiatric:        Mood and Affect: Mood normal.        Behavior: Behavior normal.    Assessment & Plan:  1: Chronic heart failure with preserved ejection fraction- - NYHA class III - euvolemic - not weighing daily but does have scales; encouraged to resume weighing and to call for an overnight weight gain of >2 pounds or a weekly weight gain of >5 pounds - weight up 12 pounds from last visit here 3 months ago - he says that his weight has declined based on office  weights at different providers offices - says that he does add "little salt" to foods but nothing like he used to; says that he'll occasionally use a pinch of salt and the importance of not adding any salt to his food was discussed - saw cardiology Nehemiah Massed) 09/01/2019 - BNP 12/02/17 was 469.0 - patient reports receiving his flu vaccine for this season  2: HTN- - BP mildly elevated today - saw PCP Nicki Reaper) 11/15/19 - BMP 10/23/19 reviewed and showed sodium 141, potassium 3.7, creatinine 1.7 and GFR 40  3: DM- - A1c 08/25/2019 was 6.8% - saw endocrinology Honor Junes) 10/26/19 - saw vascular Owens Shark) 11/29/19 - glucose at home was 116 - saw nephrology Candiss Norse) 09/13/2019  4: Lymphedema- - legs are wrapped in UNNA boots - getting changed weekly by vascular - saw vascular Owens Shark) 11/21/19  5: Chewing tobacco usage- - chewing tobacco - complete cessation discussed for 3 minutes with him  Patient did not bring his medications nor a list. Each medication was verbally reviewed with the patient and he was encouraged to bring the bottles to every visit to confirm accuracy of list.  Emphasized that he needed to go home and compare the med list that we give him against what he's currently taking and let us know if there are any discrepancies.   Return in 6 months or sooner for any questions/problems before then.

## 2019-12-01 ENCOUNTER — Ambulatory Visit: Payer: Medicare Other | Attending: Family | Admitting: Family

## 2019-12-01 ENCOUNTER — Other Ambulatory Visit: Payer: Self-pay

## 2019-12-01 ENCOUNTER — Encounter: Payer: Self-pay | Admitting: Family

## 2019-12-01 VITALS — BP 146/88 | HR 110 | Resp 20 | Ht 70.0 in | Wt 288.4 lb

## 2019-12-01 DIAGNOSIS — E1142 Type 2 diabetes mellitus with diabetic polyneuropathy: Secondary | ICD-10-CM | POA: Diagnosis not present

## 2019-12-01 DIAGNOSIS — R5383 Other fatigue: Secondary | ICD-10-CM | POA: Diagnosis not present

## 2019-12-01 DIAGNOSIS — Z720 Tobacco use: Secondary | ICD-10-CM

## 2019-12-01 DIAGNOSIS — K3184 Gastroparesis: Secondary | ICD-10-CM | POA: Diagnosis not present

## 2019-12-01 DIAGNOSIS — E785 Hyperlipidemia, unspecified: Secondary | ICD-10-CM | POA: Insufficient documentation

## 2019-12-01 DIAGNOSIS — Z794 Long term (current) use of insulin: Secondary | ICD-10-CM | POA: Insufficient documentation

## 2019-12-01 DIAGNOSIS — Z8249 Family history of ischemic heart disease and other diseases of the circulatory system: Secondary | ICD-10-CM | POA: Diagnosis not present

## 2019-12-01 DIAGNOSIS — R42 Dizziness and giddiness: Secondary | ICD-10-CM | POA: Insufficient documentation

## 2019-12-01 DIAGNOSIS — R0602 Shortness of breath: Secondary | ICD-10-CM | POA: Diagnosis not present

## 2019-12-01 DIAGNOSIS — I890 Lymphedema, not elsewhere classified: Secondary | ICD-10-CM | POA: Diagnosis not present

## 2019-12-01 DIAGNOSIS — Z87891 Personal history of nicotine dependence: Secondary | ICD-10-CM | POA: Diagnosis not present

## 2019-12-01 DIAGNOSIS — I10 Essential (primary) hypertension: Secondary | ICD-10-CM

## 2019-12-01 DIAGNOSIS — I110 Hypertensive heart disease with heart failure: Secondary | ICD-10-CM | POA: Insufficient documentation

## 2019-12-01 DIAGNOSIS — E7800 Pure hypercholesterolemia, unspecified: Secondary | ICD-10-CM | POA: Insufficient documentation

## 2019-12-01 DIAGNOSIS — G479 Sleep disorder, unspecified: Secondary | ICD-10-CM | POA: Insufficient documentation

## 2019-12-01 DIAGNOSIS — E0500 Thyrotoxicosis with diffuse goiter without thyrotoxic crisis or storm: Secondary | ICD-10-CM | POA: Diagnosis not present

## 2019-12-01 DIAGNOSIS — Z79899 Other long term (current) drug therapy: Secondary | ICD-10-CM | POA: Insufficient documentation

## 2019-12-01 DIAGNOSIS — I5032 Chronic diastolic (congestive) heart failure: Secondary | ICD-10-CM | POA: Insufficient documentation

## 2019-12-01 DIAGNOSIS — N1832 Chronic kidney disease, stage 3b: Secondary | ICD-10-CM

## 2019-12-01 NOTE — Patient Instructions (Addendum)
Resume weighing daily and call for an overnight weight gain of > 2 pounds or a weekly weight gain of >5 pounds. 

## 2019-12-04 ENCOUNTER — Other Ambulatory Visit: Payer: Self-pay

## 2019-12-04 ENCOUNTER — Ambulatory Visit (INDEPENDENT_AMBULATORY_CARE_PROVIDER_SITE_OTHER): Payer: Medicare Other | Admitting: Podiatry

## 2019-12-04 ENCOUNTER — Encounter: Payer: Self-pay | Admitting: Podiatry

## 2019-12-04 DIAGNOSIS — M79675 Pain in left toe(s): Secondary | ICD-10-CM | POA: Diagnosis not present

## 2019-12-04 DIAGNOSIS — B351 Tinea unguium: Secondary | ICD-10-CM

## 2019-12-04 DIAGNOSIS — E1151 Type 2 diabetes mellitus with diabetic peripheral angiopathy without gangrene: Secondary | ICD-10-CM

## 2019-12-04 DIAGNOSIS — M79674 Pain in right toe(s): Secondary | ICD-10-CM

## 2019-12-04 NOTE — Progress Notes (Signed)
Complaint:  Visit Type: Patient returns to my office for continued preventative foot care services. Complaint: Patient states" my nails have grown long and thick and become painful to walk and wear shoes".   Patient has been diagnosed with DM .  Marland Kitchen Patient is wearing unna boots on both legs and feet. He says these are changed weekly and is due to have them changed Tuesday.  Patient nails and toes are dirty since he is unable to bathe because of the unna boots  B/L.  The patient presents for preventative foot care services. No changes to ROS.  Patient says he injured his fourth toe nail left foot.  Podiatric Exam: Vascular: deferred this visit.  Sensorium: Deferred this visit. Nail Exam: Pt has thick disfigured discolored nails with subungual debris noted bilateral entire nail hallux through fifth toenails Ulcer Exam: There is no evidence of ulcer or pre-ulcerative changes or infection. Orthopedic Exam: Muscle tone and strength are WNL. No limitations in general ROM. No crepitus or effusions noted. Foot type and digits show no abnormalities. Bony prominences are unremarkable. Skin: No Porokeratosis. No infection or ulcers.  Red swollen toes with multiple skin necrosis bilaterally 2-4.  The infection on his fourth and fifth toe right foot has resolved.    Diagnosis:  Onychomycosis, , Pain in right toe, pain in left toes  Treatment & Plan Procedures and Treatment: Consent by patient was obtained for treatment procedures.   Debridement of mycotic and hypertrophic toenails, 1 through 5 bilateral and clearing of subungual debris. No ulceration, no infection noted. Cauterization 4,5 digits left foot.   Return Visit-Office Procedure: Patient instructed to return to the office for a follow up visit 3 months for continued evaluation and treatment.    [name] DPM

## 2019-12-05 ENCOUNTER — Encounter (INDEPENDENT_AMBULATORY_CARE_PROVIDER_SITE_OTHER): Payer: Medicare Other

## 2019-12-06 ENCOUNTER — Other Ambulatory Visit: Payer: Self-pay

## 2019-12-06 ENCOUNTER — Encounter (INDEPENDENT_AMBULATORY_CARE_PROVIDER_SITE_OTHER): Payer: Self-pay

## 2019-12-06 ENCOUNTER — Ambulatory Visit (INDEPENDENT_AMBULATORY_CARE_PROVIDER_SITE_OTHER): Payer: Medicare Other | Admitting: Nurse Practitioner

## 2019-12-06 VITALS — BP 135/77 | HR 116 | Resp 18 | Wt 293.4 lb

## 2019-12-06 DIAGNOSIS — L97201 Non-pressure chronic ulcer of unspecified calf limited to breakdown of skin: Secondary | ICD-10-CM

## 2019-12-06 NOTE — Progress Notes (Signed)
History of Present Illness  There is no documented history at this time  Assessments & Plan   There are no diagnoses linked to this encounter.    Additional instructions  Subjective:  Patient presents with venous ulcer of the Bilateral lower extremity.    Procedure:  3 layer unna wrap was placed Bilateral lower extremity.   Plan:   Follow up in one week.  

## 2019-12-12 ENCOUNTER — Encounter (INDEPENDENT_AMBULATORY_CARE_PROVIDER_SITE_OTHER): Payer: Medicare Other

## 2019-12-13 ENCOUNTER — Encounter (INDEPENDENT_AMBULATORY_CARE_PROVIDER_SITE_OTHER): Payer: Self-pay

## 2019-12-13 ENCOUNTER — Ambulatory Visit (INDEPENDENT_AMBULATORY_CARE_PROVIDER_SITE_OTHER): Payer: Medicare Other | Admitting: Nurse Practitioner

## 2019-12-13 ENCOUNTER — Other Ambulatory Visit: Payer: Self-pay

## 2019-12-13 VITALS — BP 155/75 | HR 101 | Resp 18 | Wt 295.6 lb

## 2019-12-13 DIAGNOSIS — L97201 Non-pressure chronic ulcer of unspecified calf limited to breakdown of skin: Secondary | ICD-10-CM

## 2019-12-13 NOTE — Progress Notes (Signed)
History of Present Illness  There is no documented history at this time  Assessments & Plan   There are no diagnoses linked to this encounter.    Additional instructions  Subjective:  Patient presents with venous ulcer of the Bilateral lower extremity.    Procedure:  3 layer unna wrap was placed Bilateral lower extremity.   Plan:   Follow up in one week.  

## 2019-12-20 ENCOUNTER — Other Ambulatory Visit: Payer: Self-pay

## 2019-12-20 ENCOUNTER — Encounter (INDEPENDENT_AMBULATORY_CARE_PROVIDER_SITE_OTHER): Payer: Self-pay | Admitting: Nurse Practitioner

## 2019-12-20 ENCOUNTER — Ambulatory Visit (INDEPENDENT_AMBULATORY_CARE_PROVIDER_SITE_OTHER): Payer: Medicare Other | Admitting: Nurse Practitioner

## 2019-12-20 VITALS — BP 127/68 | HR 62 | Resp 16 | Wt 291.0 lb

## 2019-12-20 DIAGNOSIS — I10 Essential (primary) hypertension: Secondary | ICD-10-CM | POA: Diagnosis not present

## 2019-12-20 DIAGNOSIS — L03119 Cellulitis of unspecified part of limb: Secondary | ICD-10-CM

## 2019-12-20 DIAGNOSIS — I5022 Chronic systolic (congestive) heart failure: Secondary | ICD-10-CM

## 2019-12-20 MED ORDER — LEVOFLOXACIN 500 MG PO TABS: 500.0000 mg | ORAL_TABLET | Freq: Every day | ORAL | 0 refills | Status: DC

## 2019-12-25 ENCOUNTER — Encounter (INDEPENDENT_AMBULATORY_CARE_PROVIDER_SITE_OTHER): Payer: Self-pay | Admitting: Nurse Practitioner

## 2019-12-25 NOTE — Progress Notes (Signed)
SUBJECTIVE:  [name], male    DOB: [birth year], 74 y.o.   MRN: [id number] Chief Complaint  Patient presents with  . Follow-up    unna check    HPI  Gary [name] is a 74 y.o. male that presents today for evaluation of his lower extremity edema as well as his Unna wraps.  Previously the patient had greatly improved however today it appears that the progress is greatly decreased.  The patient has several wet ulcerated areas in the bilateral lower extremities.  The patient also has an odor associated with the drainage.  He was previously at the emergency room for cellulitis and was given some Levaquin as well as increased his Lasix dosage.  The patient has continued to take the increased Lasix dosage however the patient is also noticeably short of breath today.  He denies any fever, chills, nausea, vomiting or diarrhea.  He denies any new difference that may have caused his worsening of his lower extremities.  Past Medical History:  Diagnosis Date  . CHF (congestive heart failure) ([place])   . Depression   . 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 Gastroparesis   . Graves disease   . Hypercholesterolemia   . Hypertension   . Mantle cell lymphoma ([place]) 07/2014  . Peripheral neuropathy     Past Surgical History:  Procedure Laterality Date  . CHOLECYSTECTOMY    . NEPHRECTOMY     right  . PACEMAKER INSERTION N/A 12/07/2017   Procedure: INSERTION PACEMAKER DUAL CHAMBER INITIAL INSERT;  Surgeon: Isaias Cowman, MD;  Location: [facility];  Service: Cardiovascular;  Laterality: N/A;  . PORTA CATH INSERTION N/A 11/03/2017   Procedure: PORTA CATH INSERTION;  Surgeon: Katha Cabal, MD;  Location: [place] [facility];  Service: Cardiovascular;  Laterality: N/A;    Social History   Socioeconomic History  . Marital status: Married    Spouse name: Not on file  . Number of children: 1  . Years of education: Not on  file  . Highest education level: Not on file  Occupational History  . Not on file  Tobacco Use  . Smoking status: Former Research scientist (life sciences)  . Smokeless tobacco: Current User    Types: Chew  Substance and Sexual Activity  . Alcohol use: Yes    Alcohol/week: 3.0 standard drinks    Types: 3 Cans of beer per week    Comment: nightly  . Drug use: Never  . Sexual activity: Not Currently  Other Topics Concern  . Not on file  Social History Narrative  . Not on file   Social Determinants of Health   Financial Resource Strain: Unknown  . Difficulty of Paying Living Expenses: Patient refused  Food Insecurity: Unknown  . Worried About Charity fundraiser in the Last Year: Patient refused  . Ran Out of Food in the Last Year: Patient refused  Transportation Needs: No Transportation Needs  . Lack of Transportation (Medical): No  . Lack of Transportation (Non-Medical): No  Physical Activity: Inactive  . Days of Exercise per Week: 0 days  . Minutes of Exercise per Session: 0 min  Stress: No Stress Concern Present  . Feeling of Stress : Not at all  Social Connections: Unknown  . Frequency of Communication with Friends and Family: Patient refused  . Frequency of Social Gatherings with Friends and Family: Patient refused  . Attends Religious Services: Patient refused  . Active Member  of Clubs or Organizations: Patient refused  . Attends Archivist Meetings: Patient refused  . Marital Status: Patient refused  Intimate Partner Violence: Unknown  . Fear of Current or Ex-Partner: Patient refused  . Emotionally Abused: Patient refused  . Physically Abused: Patient refused  . Sexually Abused: Patient refused    Family History  Problem Relation Age of Onset  . Breast cancer Mother   . Diabetes Father   . Cancer Father        Lymphoma  . Alcohol abuse Maternal Uncle   . Diabetes Sister   . Heart disease Other        grandfather    Allergies  Allergen Reactions  . Rofecoxib Nausea  Only     Review of Systems   Review of Systems: Negative Unless Checked Constitutional: [] Weight loss  [] Fever  [] Chills Cardiac: [] Chest pain   []  Atrial Fibrillation  [] Palpitations   [] Shortness of breath when laying flat   [x] Shortness of breath with exertion. [x] Shortness of breath at rest Vascular:  [] Pain in legs with walking   [] Pain in legs with standing [] Pain in legs when laying flat   [] Claudication    [] Pain in feet when laying flat    [] History of DVT   [] Phlebitis   [x] Swelling in legs   [] Varicose veins   [] Non-healing ulcers Pulmonary:   [] Uses home oxygen   [] Productive cough   [] Hemoptysis   [] Wheeze  [] COPD   [] Asthma Neurologic:  [] Dizziness   [] Seizures  [] Blackouts [] History of stroke   [] History of TIA  [] Aphasia   [] Temporary Blindness   [] Weakness or numbness in arm   [] Weakness or numbness in leg Musculoskeletal:   [] Joint swelling   [] Joint pain   [] Low back pain  []  History of Knee Replacement [] Arthritis [] back Surgeries  []  Spinal Stenosis    Hematologic:  [] Easy bruising  [] Easy bleeding   [] Hypercoagulable state   [] Anemic Gastrointestinal:  [] Diarrhea   [] Vomiting  [x] Gastroesophageal reflux/heartburn   [] Difficulty swallowing. [] Abdominal pain Genitourinary:  [] Chronic kidney disease   [] Difficult urination  [] Anuric   [] Blood in urine [] Frequent urination  [] Burning with urination   [] Hematuria Skin:  [] Rashes   [x] Ulcers [] Wounds Psychological:  [] History of anxiety   []  History of major depression  []  Memory Difficulties      OBJECTIVE:   Physical Exam  BP 127/68 (BP Location: Right Arm)   Pulse 62   Resp 16   Wt 291 lb (132 kg)   BMI 41.75 kg/m   Gen: WD/WN, NAD Head: [place]/AT, No temporalis wasting.  Ear/Nose/Throat: Hearing grossly intact, nares w/o erythema or drainage Eyes: PER, EOMI, sclera nonicteric.  Neck: Supple, no masses.  No JVD.  Pulmonary:   Visibly short of breath at rest Cardiac: RRR Vascular:  3+ edema bilaterally with weeping  from ulcers.  Foul-smelling wounds Vessel Right Left  Radial Palpable Palpable  Dorsalis Pedis  not palpable  not palpable  Posterior Tibial  not palpable  not palpable   Gastrointestinal: soft, non-distended. No guarding/no peritoneal signs.  Musculoskeletal: M/S 5/5 throughout.  No deformity or atrophy.  Neurologic: Pain and light touch intact in extremities.  Symmetrical.  Speech is fluent. Motor exam as listed above. Psychiatric: Judgment intact, Mood & affect appropriate for pt's clinical situation. Dermatologic:  Stasis dermatitis No changes consistent with cellulitis. Lymph : No Cervical lymphadenopathy, dermal thickening       ASSESSMENT AND PLAN:  1. Cellulitis of lower extremity, unspecified laterality We will  treat patient with Levaquin again as it worked very well for him last time.  The patient is instructed that he should not stay in wet wraps.  If the are wet reflexive through he should contact her office in order to change the wraps.  The patient is instructed to elevate his lower extremities as much as possible as well as to ambulate as tolerated.  2. Chronic systolic CHF (congestive heart failure) (HCC) Worsening of his heart failure may be part of the reason why his ulcerations have worsened.  This is due to increased fluid retention.  If necessary we will have the patient return biweekly from the wraps.  3. Essential hypertension, benign Continue antihypertensive medications as already ordered, these medications have been reviewed and there are no changes at this time.    Current Outpatient Medications on File Prior to Visit  Medication Sig Dispense Refill  . ACCU-CHEK AVIVA PLUS test strip USE AS INSTRUCTED. USE 1 STRIP EVERY 3 HOURS . ACCU-CHECK AVIVA PLUS TEST STRIP. DX E10.649  12  . acetaminophen (TYLENOL) 500 MG tablet Take 500 mg by mouth every 6 (six) hours as needed.     . carvedilol (COREG) 3.125 MG tablet Take 3.125 mg by mouth 2 (two) times daily with a  meal.     . ferrous sulfate 325 (65 FE) MG tablet Take 325 mg by mouth daily.    . furosemide (LASIX) 20 MG tablet Take 40 mg by mouth 2 (two) times daily.     Marland Kitchen GLUCAGON EMERGENCY 1 MG injection     . glucagon, human recombinant, (GLUCAGEN DIAGNOSTIC) 1 MG injection Use as directed    . glucose 4 GM chewable tablet Chew 1 tablet by mouth once as needed for low blood sugar.    Marland Kitchen glucose blood (PRECISION QID TEST) test strip Use 8 (eight) times daily as directed. BAYER CONTOUR NEXT TEST STRIPS E10.649    . insulin aspart (NOVOLOG) 100 UNIT/ML injection Basal rate 12am 0.8 units per hour, 9am 0.9 units per hour. (total basal insulin 20.7 units). Carbohydrate ratio 1 units for every 8gm of carbohydrate. Correction factor 1 units for every 50mg /dl over target cbg. Target CBG 80-120 1 vial 12  . levothyroxine (SYNTHROID) 25 MCG tablet Take by mouth.    Marland Kitchen lisinopril (ZESTRIL) 20 MG tablet Take 20 mg by mouth daily.    . Multiple Vitamin (MULTIVITAMIN) tablet Take 1 tablet by mouth 2 (two) times daily.     . pravastatin (PRAVACHOL) 40 MG tablet Take 40 mg by mouth daily.     . prochlorperazine (COMPAZINE) 10 MG tablet TAKE 1 TABLET (10 MG TOTAL) BY MOUTH EVERY 6 (SIX) HOURS AS NEEDED (NAUSEA OR VOMITING).    Marland Kitchen spironolactone (ALDACTONE) 25 MG tablet Take 25 mg by mouth daily.    . vitamin B-12 (CYANOCOBALAMIN) 250 MCG tablet Take 250 mcg by mouth daily.    . cyanocobalamin (,VITAMIN B-12,) 1000 MCG/ML injection Inject into the muscle.     Current Facility-Administered Medications on File Prior to Visit  Medication Dose Route Frequency Provider Last Rate Last Admin  . heparin lock flush 100 unit/mL  500 Units Intravenous Once Lloyd Huger, MD      . sodium chloride flush (NS) 0.9 % injection 10 mL  10 mL Intravenous PRN Lloyd Huger, MD   10 mL at 02/01/18 0829  . sodium chloride flush (NS) 0.9 % injection 10 mL  10 mL Intravenous PRN Lloyd Huger, MD   10  mL at 04/05/18 0800     There are no Patient Instructions on file for this visit. No follow-ups on file.   Kris Hartmann, NP  This note was completed with Sales executive.  Any errors are purely unintentional.

## 2019-12-27 ENCOUNTER — Other Ambulatory Visit: Payer: Self-pay

## 2019-12-27 ENCOUNTER — Encounter (INDEPENDENT_AMBULATORY_CARE_PROVIDER_SITE_OTHER): Payer: Self-pay

## 2019-12-27 ENCOUNTER — Ambulatory Visit (INDEPENDENT_AMBULATORY_CARE_PROVIDER_SITE_OTHER): Payer: Medicare Other | Admitting: Nurse Practitioner

## 2019-12-27 VITALS — BP 122/78 | HR 121 | Resp 16 | Wt 290.0 lb

## 2019-12-27 DIAGNOSIS — L97201 Non-pressure chronic ulcer of unspecified calf limited to breakdown of skin: Secondary | ICD-10-CM

## 2019-12-27 MED ORDER — LEVOFLOXACIN 500 MG PO TABS: 500.0000 mg | ORAL_TABLET | Freq: Every day | ORAL | 0 refills | Status: DC

## 2019-12-27 NOTE — Progress Notes (Signed)
History of Present Illness  There is no documented history at this time  Assessments & Plan   There are no diagnoses linked to this encounter.    Additional instructions  Subjective:  Patient presents with venous ulcer of the Bilateral lower extremity.    Procedure:  3 layer unna wrap was placed Bilateral lower extremity.   Plan:   Follow up in one week.  

## 2019-12-28 DIAGNOSIS — I495 Sick sinus syndrome: Secondary | ICD-10-CM | POA: Diagnosis not present

## 2019-12-28 DIAGNOSIS — E1159 Type 2 diabetes mellitus with other circulatory complications: Secondary | ICD-10-CM | POA: Diagnosis not present

## 2019-12-28 DIAGNOSIS — I5022 Chronic systolic (congestive) heart failure: Secondary | ICD-10-CM | POA: Diagnosis not present

## 2019-12-28 DIAGNOSIS — Z6841 Body Mass Index (BMI) 40.0 and over, adult: Secondary | ICD-10-CM | POA: Diagnosis not present

## 2019-12-28 DIAGNOSIS — I10 Essential (primary) hypertension: Secondary | ICD-10-CM | POA: Diagnosis not present

## 2019-12-28 DIAGNOSIS — I2510 Atherosclerotic heart disease of native coronary artery without angina pectoris: Secondary | ICD-10-CM | POA: Diagnosis not present

## 2019-12-29 ENCOUNTER — Other Ambulatory Visit: Payer: Self-pay

## 2019-12-29 ENCOUNTER — Encounter (INDEPENDENT_AMBULATORY_CARE_PROVIDER_SITE_OTHER): Payer: Self-pay | Admitting: Nurse Practitioner

## 2019-12-29 ENCOUNTER — Ambulatory Visit (INDEPENDENT_AMBULATORY_CARE_PROVIDER_SITE_OTHER): Payer: Medicare Other | Admitting: Nurse Practitioner

## 2019-12-29 VITALS — BP 127/77 | HR 93 | Ht 70.0 in | Wt 290.0 lb

## 2019-12-29 DIAGNOSIS — L97201 Non-pressure chronic ulcer of unspecified calf limited to breakdown of skin: Secondary | ICD-10-CM

## 2019-12-29 NOTE — Progress Notes (Signed)
History of Present Illness  There is no documented history at this time  Assessments & Plan   There are no diagnoses linked to this encounter.    Additional instructions  Subjective:  Patient presents with venous ulcer of the Bilateral lower extremity.    Procedure:  3 layer unna wrap was placed Bilateral lower extremity.   Plan:   Follow up in one week.  

## 2020-01-02 ENCOUNTER — Encounter (INDEPENDENT_AMBULATORY_CARE_PROVIDER_SITE_OTHER): Payer: Self-pay | Admitting: Nurse Practitioner

## 2020-01-03 ENCOUNTER — Ambulatory Visit (INDEPENDENT_AMBULATORY_CARE_PROVIDER_SITE_OTHER): Payer: Medicare Other | Admitting: Nurse Practitioner

## 2020-01-03 ENCOUNTER — Encounter (INDEPENDENT_AMBULATORY_CARE_PROVIDER_SITE_OTHER): Payer: Self-pay | Admitting: Nurse Practitioner

## 2020-01-03 ENCOUNTER — Other Ambulatory Visit: Payer: Self-pay

## 2020-01-03 VITALS — BP 121/69 | HR 106 | Ht 70.0 in | Wt 289.0 lb

## 2020-01-03 DIAGNOSIS — L97201 Non-pressure chronic ulcer of unspecified calf limited to breakdown of skin: Secondary | ICD-10-CM | POA: Diagnosis not present

## 2020-01-03 NOTE — Progress Notes (Signed)
History of Present Illness  There is no documented history at this time  Assessments & Plan   There are no diagnoses linked to this encounter.    Additional instructions  Subjective:  Patient presents with venous ulcer of the Bilateral lower extremity.    Procedure:  3 layer unna wrap was placed Bilateral lower extremity.   Plan:   Follow up in one week.  

## 2020-01-08 ENCOUNTER — Encounter (INDEPENDENT_AMBULATORY_CARE_PROVIDER_SITE_OTHER): Payer: Self-pay | Admitting: Nurse Practitioner

## 2020-01-10 ENCOUNTER — Ambulatory Visit (INDEPENDENT_AMBULATORY_CARE_PROVIDER_SITE_OTHER): Payer: Medicare Other | Admitting: Nurse Practitioner

## 2020-01-10 ENCOUNTER — Encounter (INDEPENDENT_AMBULATORY_CARE_PROVIDER_SITE_OTHER): Payer: Self-pay

## 2020-01-10 ENCOUNTER — Other Ambulatory Visit: Payer: Self-pay

## 2020-01-10 VITALS — BP 133/75 | HR 97 | Resp 16 | Wt 287.6 lb

## 2020-01-10 DIAGNOSIS — L97201 Non-pressure chronic ulcer of unspecified calf limited to breakdown of skin: Secondary | ICD-10-CM | POA: Diagnosis not present

## 2020-01-10 NOTE — Progress Notes (Signed)
History of Present Illness  There is no documented history at this time  Assessments & Plan   There are no diagnoses linked to this encounter.    Additional instructions  Subjective:  Patient presents with venous ulcer of the Bilateral lower extremity.    Procedure:  3 layer unna wrap was placed Bilateral lower extremity.   Plan:   Follow up in one week.  

## 2020-01-17 ENCOUNTER — Other Ambulatory Visit: Payer: Self-pay

## 2020-01-17 ENCOUNTER — Ambulatory Visit (INDEPENDENT_AMBULATORY_CARE_PROVIDER_SITE_OTHER): Payer: Medicare Other | Admitting: Nurse Practitioner

## 2020-01-17 ENCOUNTER — Encounter (INDEPENDENT_AMBULATORY_CARE_PROVIDER_SITE_OTHER): Payer: Self-pay | Admitting: Nurse Practitioner

## 2020-01-17 VITALS — BP 143/81 | HR 91 | Ht 70.0 in | Wt 286.0 lb

## 2020-01-17 DIAGNOSIS — I10 Essential (primary) hypertension: Secondary | ICD-10-CM

## 2020-01-17 DIAGNOSIS — L03119 Cellulitis of unspecified part of limb: Secondary | ICD-10-CM

## 2020-01-17 DIAGNOSIS — L97201 Non-pressure chronic ulcer of unspecified calf limited to breakdown of skin: Secondary | ICD-10-CM

## 2020-01-17 NOTE — Progress Notes (Signed)
Subjective:    [name], male    DOB: Oct 04, 1946, 74 y.o.   MRN: [id number] Chief Complaint  Patient presents with  . Follow-up    unna  boot check    Gary [name] is a 74 y.o. male that presents today for evaluation of his lower extremity edema as well as his Unna wraps.  Previously the patient's lower extremities had several wet ulcerations as well as a strong odor suggesting possible infection.  The patient was given a 2-week course of Levaquin due to his previously hard to treat cellulitis.  Today, the patient's lower extremities look much improved.  There is no odor associated.  The previous ulcerations have dried and are beginning to scab over.  The patient shortness of breath is also decreased with recent medication changes.  The patient states that his legs feel much better at this time.  Despite feeling better they look much improved from his last visit.    Review of Systems  Constitutional: Positive for fatigue.  Respiratory: Positive for shortness of breath (with exertion).   Cardiovascular: Positive for leg swelling.  Musculoskeletal: Positive for gait problem.  All other systems reviewed and are negative.      Objective:   Physical Exam Vitals reviewed.  HENT:     Head: Normocephalic.  Cardiovascular:     Rate and Rhythm: Normal rate and regular rhythm.  Musculoskeletal:     Right lower leg: 2+ Pitting Edema present.     Left lower leg: 2+ Pitting Edema present.  Skin:    General: Skin is warm.     Comments: Multiple ulcers in various stages bilaterally  Neurological:     Mental Status: He is oriented to person, place, and time.  Psychiatric:        Mood and Affect: Mood normal.        Behavior: Behavior normal.        Thought Content: Thought content normal.        Judgment: Judgment normal.     BP (!) 143/81   Pulse 91   Ht 5\' 10"  (1.778 m)   Wt 286 lb (129.7 kg)   BMI 41.04 kg/m   Past Medical History:  Diagnosis Date  . CHF  (congestive heart failure) ([place])   . Depression   . 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 Gastroparesis   . Graves disease   . Hypercholesterolemia   . Hypertension   . Mantle cell lymphoma ([place]) 07/2014  . Peripheral neuropathy     Social History   Socioeconomic History  . Marital status: Married    Spouse name: Not on file  . Number of children: 1  . Years of education: Not on file  . Highest education level: Not on file  Occupational History  . Not on file  Tobacco Use  . Smoking status: Former Research scientist (life sciences)  . Smokeless tobacco: Current User    Types: Chew  Substance and Sexual Activity  . Alcohol use: Yes    Alcohol/week: 3.0 standard drinks    Types: 3 Cans of beer per week    Comment: nightly  . Drug use: Never  . Sexual activity: Not Currently  Other Topics Concern  . Not on file  Social History Narrative  . Not on file   Social Determinants of Health   Financial Resource Strain: Unknown  . Difficulty of Paying Living Expenses: Patient refused  Food Insecurity:  Unknown  . Worried About Charity fundraiser in the Last Year: Patient refused  . Ran Out of Food in the Last Year: Patient refused  Transportation Needs: No Transportation Needs  . Lack of Transportation (Medical): No  . Lack of Transportation (Non-Medical): No  Physical Activity: Inactive  . Days of Exercise per Week: 0 days  . Minutes of Exercise per Session: 0 min  Stress: No Stress Concern Present  . Feeling of Stress : Not at all  Social Connections: Unknown  . Frequency of Communication with Friends and Family: Patient refused  . Frequency of Social Gatherings with Friends and Family: Patient refused  . Attends Religious Services: Patient refused  . Active Member of Clubs or Organizations: Patient refused  . Attends Archivist Meetings: Patient refused  . Marital Status: Patient refused  Intimate Partner Violence: Unknown  .  Fear of Current or Ex-Partner: Patient refused  . Emotionally Abused: Patient refused  . Physically Abused: Patient refused  . Sexually Abused: Patient refused    Past Surgical History:  Procedure Laterality Date  . CHOLECYSTECTOMY    . NEPHRECTOMY     right  . PACEMAKER INSERTION N/A 12/07/2017   Procedure: INSERTION PACEMAKER DUAL CHAMBER INITIAL INSERT;  Surgeon: Isaias Cowman, MD;  Location: [facility] ORS;  Service: Cardiovascular;  Laterality: N/A;  . PORTA CATH INSERTION N/A 11/03/2017   Procedure: PORTA CATH INSERTION;  Surgeon: Katha Cabal, MD;  Location: [facility];  Service: Cardiovascular;  Laterality: N/A;    Family History  Problem Relation Age of Onset  . Breast cancer Mother   . Diabetes Father   . Cancer Father        Lymphoma  . Alcohol abuse Maternal Uncle   . Diabetes Sister   . Heart disease Other        grandfather    Allergies  Allergen Reactions  . Rofecoxib Nausea Only       Assessment & Plan:   1. Skin ulcer of calf, limited to breakdown of skin, unspecified laterality (HCC) Previous ulcerations have begun to heal.  Patient still has some dead skin present but the previous wounds are also showing signs of drying as well as getting smaller.  We will keep the patient in bilateral Unna wraps.  The patient is advised to continue elevating his lower extremities as well as exercising as much as possible.  We will have the patient return to the office in 4 weeks for evaluation of progression with Unna wraps.  2. Cellulitis of lower extremity, unspecified laterality Currently resolved lower extremities look much better  3. Essential hypertension, benign Continue antihypertensive medications as already ordered, these medications have been reviewed and there are no changes at this time.    Current Outpatient Medications on File Prior to Visit  Medication Sig Dispense Refill  . ACCU-CHEK AVIVA PLUS test strip USE AS INSTRUCTED. USE 1  STRIP EVERY 3 HOURS . ACCU-CHECK AVIVA PLUS TEST STRIP. DX E10.649  12  . acetaminophen (TYLENOL) 500 MG tablet Take 500 mg by mouth every 6 (six) hours as needed.     . cyanocobalamin (,VITAMIN B-12,) 1000 MCG/ML injection Inject into the muscle.    . ferrous sulfate 325 (65 FE) MG tablet Take 325 mg by mouth daily.    . furosemide (LASIX) 20 MG tablet Take 40 mg by mouth 2 (two) times daily.     Marland Kitchen GLUCAGON EMERGENCY 1 MG injection     . glucagon, human  recombinant, (GLUCAGEN DIAGNOSTIC) 1 MG injection Use as directed    . glucose 4 GM chewable tablet Chew 1 tablet by mouth once as needed for low blood sugar.    Marland Kitchen glucose blood (PRECISION QID TEST) test strip Use 8 (eight) times daily as directed. BAYER CONTOUR NEXT TEST STRIPS E10.649    . insulin aspart (NOVOLOG) 100 UNIT/ML injection Basal rate 12am 0.8 units per hour, 9am 0.9 units per hour. (total basal insulin 20.7 units). Carbohydrate ratio 1 units for every 8gm of carbohydrate. Correction factor 1 units for every 50mg /dl over target cbg. Target CBG 80-120 1 vial 12  . levofloxacin (LEVAQUIN) 500 MG tablet Take 1 tablet (500 mg total) by mouth daily. 10 tablet 0  . levothyroxine (SYNTHROID) 25 MCG tablet Take by mouth.    Marland Kitchen lisinopril (ZESTRIL) 20 MG tablet Take 20 mg by mouth daily.    . Multiple Vitamin (MULTIVITAMIN) tablet Take 1 tablet by mouth 2 (two) times daily.     . pravastatin (PRAVACHOL) 40 MG tablet Take 40 mg by mouth daily.     . prochlorperazine (COMPAZINE) 10 MG tablet TAKE 1 TABLET (10 MG TOTAL) BY MOUTH EVERY 6 (SIX) HOURS AS NEEDED (NAUSEA OR VOMITING).    Marland Kitchen spironolactone (ALDACTONE) 25 MG tablet Take 25 mg by mouth daily.    . vitamin B-12 (CYANOCOBALAMIN) 250 MCG tablet Take 250 mcg by mouth daily.    . carvedilol (COREG) 3.125 MG tablet Take 3.125 mg by mouth 2 (two) times daily with a meal.      Current Facility-Administered Medications on File Prior to Visit  Medication Dose Route Frequency Provider Last Rate  Last Admin  . heparin lock flush 100 unit/mL  500 Units Intravenous Once Lloyd Huger, MD      . sodium chloride flush (NS) 0.9 % injection 10 mL  10 mL Intravenous PRN Lloyd Huger, MD   10 mL at 02/01/18 0829  . sodium chloride flush (NS) 0.9 % injection 10 mL  10 mL Intravenous PRN Lloyd Huger, MD   10 mL at 04/05/18 0800    There are no Patient Instructions on file for this visit. No follow-ups on file.   Kris Hartmann, NP

## 2020-01-24 ENCOUNTER — Inpatient Hospital Stay: Payer: Medicare Other | Attending: Oncology | Admitting: *Deleted

## 2020-01-24 ENCOUNTER — Other Ambulatory Visit: Payer: Self-pay

## 2020-01-24 ENCOUNTER — Ambulatory Visit
Admission: RE | Admit: 2020-01-24 | Discharge: 2020-01-24 | Disposition: A | Discharge status: Home / Self Care | Payer: Medicare Other | Source: Ambulatory Visit | Attending: Oncology | Admitting: Oncology

## 2020-01-24 ENCOUNTER — Encounter (INDEPENDENT_AMBULATORY_CARE_PROVIDER_SITE_OTHER): Payer: Self-pay

## 2020-01-24 ENCOUNTER — Ambulatory Visit (INDEPENDENT_AMBULATORY_CARE_PROVIDER_SITE_OTHER): Payer: Medicare Other | Admitting: Nurse Practitioner

## 2020-01-24 VITALS — BP 100/65 | HR 112 | Resp 16 | Wt 287.0 lb

## 2020-01-24 DIAGNOSIS — D509 Iron deficiency anemia, unspecified: Secondary | ICD-10-CM | POA: Diagnosis not present

## 2020-01-24 DIAGNOSIS — I110 Hypertensive heart disease with heart failure: Secondary | ICD-10-CM | POA: Insufficient documentation

## 2020-01-24 DIAGNOSIS — C8318 Mantle cell lymphoma, lymph nodes of multiple sites: Secondary | ICD-10-CM

## 2020-01-24 DIAGNOSIS — Z87891 Personal history of nicotine dependence: Secondary | ICD-10-CM | POA: Insufficient documentation

## 2020-01-24 DIAGNOSIS — E1143 Type 2 diabetes mellitus with diabetic autonomic (poly)neuropathy: Secondary | ICD-10-CM | POA: Insufficient documentation

## 2020-01-24 DIAGNOSIS — Z803 Family history of malignant neoplasm of breast: Secondary | ICD-10-CM | POA: Diagnosis not present

## 2020-01-24 DIAGNOSIS — Z95828 Presence of other vascular implants and grafts: Secondary | ICD-10-CM

## 2020-01-24 DIAGNOSIS — C8310 Mantle cell lymphoma, unspecified site: Secondary | ICD-10-CM | POA: Diagnosis not present

## 2020-01-24 DIAGNOSIS — Z794 Long term (current) use of insulin: Secondary | ICD-10-CM | POA: Diagnosis not present

## 2020-01-24 DIAGNOSIS — L97201 Non-pressure chronic ulcer of unspecified calf limited to breakdown of skin: Secondary | ICD-10-CM | POA: Diagnosis not present

## 2020-01-24 DIAGNOSIS — R5381 Other malaise: Secondary | ICD-10-CM | POA: Diagnosis not present

## 2020-01-24 DIAGNOSIS — D696 Thrombocytopenia, unspecified: Secondary | ICD-10-CM | POA: Insufficient documentation

## 2020-01-24 DIAGNOSIS — R001 Bradycardia, unspecified: Secondary | ICD-10-CM | POA: Insufficient documentation

## 2020-01-24 DIAGNOSIS — N289 Disorder of kidney and ureter, unspecified: Secondary | ICD-10-CM | POA: Insufficient documentation

## 2020-01-24 DIAGNOSIS — R531 Weakness: Secondary | ICD-10-CM | POA: Insufficient documentation

## 2020-01-24 DIAGNOSIS — E11311 Type 2 diabetes mellitus with unspecified diabetic retinopathy with macular edema: Secondary | ICD-10-CM | POA: Diagnosis not present

## 2020-01-24 DIAGNOSIS — E7800 Pure hypercholesterolemia, unspecified: Secondary | ICD-10-CM | POA: Insufficient documentation

## 2020-01-24 DIAGNOSIS — R5383 Other fatigue: Secondary | ICD-10-CM | POA: Insufficient documentation

## 2020-01-24 DIAGNOSIS — Z79899 Other long term (current) drug therapy: Secondary | ICD-10-CM | POA: Diagnosis not present

## 2020-01-24 DIAGNOSIS — I509 Heart failure, unspecified: Secondary | ICD-10-CM | POA: Insufficient documentation

## 2020-01-24 DIAGNOSIS — N179 Acute kidney failure, unspecified: Secondary | ICD-10-CM

## 2020-01-24 LAB — COMPREHENSIVE METABOLIC PANEL
ALT: 10 U/L (ref 0–44)
AST: 20 U/L (ref 15–41)
Albumin: 3.2 g/dL — ABNORMAL LOW (ref 3.5–5.0)
Alkaline Phosphatase: 103 U/L (ref 38–126)
Anion gap: 14 (ref 5–15)
BUN: 24 mg/dL — ABNORMAL HIGH (ref 8–23)
CO2: 28 mmol/L (ref 22–32)
Calcium: 7.7 mg/dL — ABNORMAL LOW (ref 8.9–10.3)
Chloride: 97 mmol/L — ABNORMAL LOW (ref 98–111)
Creatinine, Ser: 1.88 mg/dL — ABNORMAL HIGH (ref 0.61–1.24)
GFR calc Af Amer: 41 mL/min — ABNORMAL LOW (ref >60)
GFR calc non Af Amer: 35 mL/min — ABNORMAL LOW (ref >60)
Glucose, Bld: 68 mg/dL — ABNORMAL LOW (ref 70–99)
Potassium: 2.9 mmol/L — ABNORMAL LOW (ref 3.5–5.1)
Sodium: 139 mmol/L (ref 135–145)
Total Bilirubin: 0.7 mg/dL (ref 0.3–1.2)
Total Protein: 6.4 g/dL — ABNORMAL LOW (ref 6.5–8.1)

## 2020-01-24 LAB — CBC WITH DIFFERENTIAL/PLATELET
Abs Immature Granulocytes: 0.04 10*3/uL (ref 0.00–0.07)
Basophils Absolute: 0.1 10*3/uL (ref 0.0–0.1)
Basophils Relative: 1 %
Eosinophils Absolute: 0.1 10*3/uL (ref 0.0–0.5)
Eosinophils Relative: 2 %
HCT: 40.8 % (ref 39.0–52.0)
Hemoglobin: 13.5 g/dL (ref 13.0–17.0)
Immature Granulocytes: 1 %
Lymphocytes Relative: 16 %
Lymphs Abs: 0.9 10*3/uL (ref 0.7–4.0)
MCH: 31.3 pg (ref 26.0–34.0)
MCHC: 33.1 g/dL (ref 30.0–36.0)
MCV: 94.7 fL (ref 80.0–100.0)
Monocytes Absolute: 0.6 10*3/uL (ref 0.1–1.0)
Monocytes Relative: 10 %
Neutro Abs: 4 10*3/uL (ref 1.7–7.7)
Neutrophils Relative %: 70 %
Platelets: 118 10*3/uL — ABNORMAL LOW (ref 150–400)
RBC: 4.31 MIL/uL (ref 4.22–5.81)
RDW: 13.2 % (ref 11.5–15.5)
WBC: 5.7 10*3/uL (ref 4.0–10.5)
nRBC: 0 % (ref 0.0–0.2)

## 2020-01-24 MED ORDER — IOHEXOL 300 MG/ML  SOLN
75.0000 mL | Freq: Once | INTRAMUSCULAR | Status: AC | PRN
  Administered 2020-01-24: 75 mL via INTRAVENOUS

## 2020-01-24 MED ORDER — HEPARIN SOD (PORK) LOCK FLUSH 100 UNIT/ML IV SOLN
500.0000 [IU] | Freq: Once | INTRAVENOUS | Status: AC
  Administered 2020-01-24: 11:00:00 500 [IU] via INTRAVENOUS
  Filled 2020-01-24: qty 5

## 2020-01-24 MED ORDER — SODIUM CHLORIDE 0.9% FLUSH
10.0000 mL | INTRAVENOUS | Status: DC | PRN
  Administered 2020-01-24: 10 mL via INTRAVENOUS
  Filled 2020-01-24: qty 10

## 2020-01-24 NOTE — Progress Notes (Signed)
History of Present Illness  There is no documented history at this time  Assessments & Plan   There are no diagnoses linked to this encounter.    Additional instructions  Subjective:  Patient presents with venous ulcer of the Bilateral lower extremity.    Procedure:  3 layer unna wrap was placed Bilateral lower extremity.   Plan:   Follow up in one week.  

## 2020-01-26 ENCOUNTER — Encounter: Payer: Self-pay | Admitting: Oncology

## 2020-01-26 ENCOUNTER — Telehealth: Payer: Self-pay | Admitting: Internal Medicine

## 2020-01-26 ENCOUNTER — Other Ambulatory Visit: Payer: Self-pay

## 2020-01-26 NOTE — Telephone Encounter (Signed)
Pt called in and he said Dr. Nicki Reaper has treated him for a yeast rash in his groin area before. He said it has been years ago and she prescribed him so kind of cream that he could use. He said he had the tube for years and he believes she treated him for it when she worked at [employer]. Pt said this happens every year when weather turns warm. I let him know she may need an appt but someone will call him back

## 2020-01-26 NOTE — Telephone Encounter (Signed)
Rash in creases of leg and near scrotum area, offered appointment for Monday patient declined . Advised PCP out of office, patient stated he would wait until return if area worsens he will be seen at [facility].

## 2020-01-26 NOTE — Progress Notes (Signed)
[place]  [phone] [phone]  ID: Gary [name] OB: 02/22/1946  MR#: [id number]  ZHG#:[id number]  Patient Care Team: [name], MD as PCP - General (Internal Medicine) Lloyd Huger, MD as Consulting Physician (Oncology) Lucky Cowboy Erskine Squibb, MD as Referring Physician (Vascular Surgery) Schnier, Dolores Lory, MD (Vascular Surgery) Corey Skains, MD as Consulting Physician (Cardiology)  CHIEF COMPLAINT: Mantle cell lymphoma.  INTERVAL HISTORY: Patient returns to clinic today for further evaluation and discussion of his imaging results.  He continues to feel well and at his baseline.  He has chronic weakness and fatigue. He denies any fevers, chills, or night sweats. He has a good appetite and denies weight loss. He has no neurologic complaints.  He denies any chest pain, shortness of breath, cough, or hemoptysis.  He has no nausea, vomiting, constipation, or diarrhea.  He denies any melena or hematochezia.  He has no urinary complaints.  Patient offers no further specific complaints today.  Review of Systems  Constitutional: Positive for malaise/fatigue. Negative for diaphoresis, fever and weight loss.  Respiratory: Negative.  Negative for cough, hemoptysis and shortness of breath.   Cardiovascular: Negative.  Negative for chest pain and leg swelling.  Gastrointestinal: Negative.  Negative for abdominal pain.  Genitourinary: Negative.  Negative for dysuria.  Musculoskeletal: Negative.  Negative for back pain.  Skin: Negative.  Negative for rash.  Neurological: Positive for weakness. Negative for dizziness, focal weakness and headaches.  Psychiatric/Behavioral: Negative.  The patient is not nervous/anxious.    As per HPI. Otherwise, a complete review of systems is negative.  PAST MEDICAL HISTORY: Past Medical History:  Diagnosis Date  . CHF (congestive heart failure) ([place])   . Depression   . Diabetes mellitus due to underlying  condition with diabetic retinopathy with macular edema   . Gastroparesis   . Graves disease   . Hypercholesterolemia   . Hypertension   . Mantle cell lymphoma ([place]) 07/2014  . Peripheral neuropathy     PAST SURGICAL HISTORY: Past Surgical History:  Procedure Laterality Date  . CHOLECYSTECTOMY    . NEPHRECTOMY     right  . PACEMAKER INSERTION N/A 12/07/2017   Procedure: INSERTION PACEMAKER DUAL CHAMBER INITIAL INSERT;  Surgeon: Isaias Cowman, MD;  Location: [facility] ORS;  Service: Cardiovascular;  Laterality: N/A;  . PORTA CATH INSERTION N/A 11/03/2017   Procedure: PORTA CATH INSERTION;  Surgeon: Katha Cabal, MD;  Location: [place] [facility];  Service: Cardiovascular;  Laterality: N/A;    FAMILY HISTORY Family History  Problem Relation Age of Onset  . Breast cancer Mother   . Diabetes Father   . Cancer Father        Lymphoma  . Alcohol abuse Maternal Uncle   . Diabetes Sister   . Heart disease Other        grandfather       ADVANCED DIRECTIVES:    HEALTH MAINTENANCE: Social History   Tobacco Use  . Smoking status: Former Research scientist (life sciences)  . Smokeless tobacco: Current User    Types: Chew  Substance Use Topics  . Alcohol use: Yes    Alcohol/week: 3.0 standard drinks    Types: 3 Cans of beer per week    Comment: nightly  . Drug use: Never     Colonoscopy:  PAP:  Bone density:  Lipid panel:  Allergies  Allergen Reactions  . Rofecoxib Nausea Only    Current Outpatient Medications  Medication Sig Dispense Refill  . ACCU-CHEK AVIVA PLUS  test strip USE AS INSTRUCTED. USE 1 STRIP EVERY 3 HOURS . ACCU-CHECK AVIVA PLUS TEST STRIP. DX E10.649  12  . acetaminophen (TYLENOL) 500 MG tablet Take 500 mg by mouth every 6 (six) hours as needed.     . carvedilol (COREG) 3.125 MG tablet Take 3.125 mg by mouth 2 (two) times daily with a meal.     . cyanocobalamin (,VITAMIN B-12,) 1000 MCG/ML injection Inject into the muscle.    . ferrous sulfate 325 (65 FE) MG tablet  Take 325 mg by mouth daily.    . furosemide (LASIX) 20 MG tablet Take 40 mg by mouth 2 (two) times daily.     Marland Kitchen GLUCAGON EMERGENCY 1 MG injection     . glucagon, human recombinant, (GLUCAGEN DIAGNOSTIC) 1 MG injection Use as directed    . glucose 4 GM chewable tablet Chew 1 tablet by mouth once as needed for low blood sugar.    Marland Kitchen glucose blood (PRECISION QID TEST) test strip Use 8 (eight) times daily as directed. BAYER CONTOUR NEXT TEST STRIPS E10.649    . insulin aspart (NOVOLOG) 100 UNIT/ML injection Basal rate 12am 0.8 units per hour, 9am 0.9 units per hour. (total basal insulin 20.7 units). Carbohydrate ratio 1 units for every 8gm of carbohydrate. Correction factor 1 units for every 50mg /dl over target cbg. Target CBG 80-120 1 vial 12  . levofloxacin (LEVAQUIN) 500 MG tablet Take 1 tablet (500 mg total) by mouth daily. 10 tablet 0  . levothyroxine (SYNTHROID) 25 MCG tablet Take by mouth.    Marland Kitchen lisinopril (ZESTRIL) 20 MG tablet Take 20 mg by mouth daily.    . Multiple Vitamin (MULTIVITAMIN) tablet Take 1 tablet by mouth 2 (two) times daily.     . pravastatin (PRAVACHOL) 40 MG tablet Take 40 mg by mouth daily.     . prochlorperazine (COMPAZINE) 10 MG tablet TAKE 1 TABLET (10 MG TOTAL) BY MOUTH EVERY 6 (SIX) HOURS AS NEEDED (NAUSEA OR VOMITING).    Marland Kitchen spironolactone (ALDACTONE) 25 MG tablet Take 25 mg by mouth daily.    . vitamin B-12 (CYANOCOBALAMIN) 250 MCG tablet Take 250 mcg by mouth daily.     No current facility-administered medications for this visit.   Facility-Administered Medications Ordered in Other Visits  Medication Dose Route Frequency Provider Last Rate Last Admin  . heparin lock flush 100 unit/mL  500 Units Intravenous Once Lloyd Huger, MD      . sodium chloride flush (NS) 0.9 % injection 10 mL  10 mL Intravenous PRN Lloyd Huger, MD   10 mL at 02/01/18 0829  . sodium chloride flush (NS) 0.9 % injection 10 mL  10 mL Intravenous PRN Lloyd Huger, MD   10 mL  at 04/05/18 0800    OBJECTIVE: Vitals:   01/29/20 1046  BP: 132/66  Pulse: 79  Resp: 20  Temp: (!) 96.3 F (35.7 C)  SpO2: 100%     Body mass index is 41.3 kg/m.    ECOG FS:0 - Asymptomatic  General: Well-developed, well-nourished, no acute distress.  Sitting in a wheelchair. Eyes: Pink conjunctiva, anicteric sclera. HEENT: Normocephalic, moist mucous membranes. Lungs: No audible wheezing or coughing. Heart: Regular rate and rhythm. Abdomen: Soft, nontender, no obvious distention. Musculoskeletal: No edema, cyanosis, or clubbing. Neuro: Alert, answering all questions appropriately. Cranial nerves grossly intact. Skin: No rashes or petechiae noted. Psych: Normal affect.   LAB RESULTS:  Lab Results  Component Value Date   NA 139 01/24/2020  K 2.9 (L) 01/24/2020   CL 97 (L) 01/24/2020   CO2 28 01/24/2020   GLUCOSE 68 (L) 01/24/2020   BUN 24 (H) 01/24/2020   CREATININE 1.88 (H) 01/24/2020   CALCIUM 7.7 (L) 01/24/2020   PROT 6.4 (L) 01/24/2020   ALBUMIN 3.2 (L) 01/24/2020   AST 20 01/24/2020   ALT 10 01/24/2020   ALKPHOS 103 01/24/2020   BILITOT 0.7 01/24/2020   GFRNONAA 35 (L) 01/24/2020   GFRAA 41 (L) 01/24/2020    Lab Results  Component Value Date   WBC 5.7 01/24/2020   NEUTROABS 4.0 01/24/2020   HGB 13.5 01/24/2020   HCT 40.8 01/24/2020   MCV 94.7 01/24/2020   PLT 118 (L) 01/24/2020   Lab Results  Component Value Date   IRON 13 (L) 08/11/2017   TIBC 223 (L) 01/12/2017   IRONPCTSAT 3.8 (L) 08/11/2017   Lab Results  Component Value Date   FERRITIN 42.0 08/11/2017     STUDIES:   ASSESSMENT: Low-grade mantle cell lymphoma diagnosed in September 2015, iron deficiency anemia.  PLAN:    1. Mantle cell lymphoma, stage III: Patient's initial diagnosis was on inguinal lymph node biopsy on July 12, 2014.  Patient was noted to have progressive disease and underwent chemotherapy using Rituxan and Treanda completing cycle 4 on Mar 09, 2018.  His most  recent imaging with CT scan on January 24, 2020 reviewed independently and reported as above with no obvious evidence of recurrent or progressive disease.  No intervention is needed at this time.  We will continue imaging every 6 months until patient is 3 years removed from treatment and then transition to yearly imaging.  Return to clinic in 6 months with repeat CT scan, laboratory work, and further evaluation.   2. Decreased ejection fraction: Chronic and unchanged.  Previously patient had an ejection fraction of 40% with no clinical CHF.  Continue monitoring and evaluation per cardiology. 3. Diabetes: Patient has improved blood glucose control. Continue insulin pump as directed. 4. Thrombocytopenia: Chronic and unchanged.  Patient's platelet count is 118. 5.  Anemia: Resolved. 6.  Leukopenia: Resolved. 7.  Bradycardia: Patient has pacemaker in place. 8.  Renal insufficiency: Patient's creatinine is trended up slightly to 1.88.  Monitor.  Patient expressed understanding and was in agreement with this plan. He also understands that He can call clinic at any time with any questions, concerns, or complaints.    Lloyd Huger, MD   01/29/2020 12:11 PM

## 2020-01-27 NOTE — Telephone Encounter (Signed)
If agreeable, nystatin cream - apply to affected area bid.  Let me know if persistent problems.

## 2020-01-29 ENCOUNTER — Other Ambulatory Visit: Payer: Self-pay

## 2020-01-29 ENCOUNTER — Inpatient Hospital Stay (HOSPITAL_BASED_OUTPATIENT_CLINIC_OR_DEPARTMENT_OTHER): Payer: Medicare Other | Admitting: Oncology

## 2020-01-29 ENCOUNTER — Encounter (INDEPENDENT_AMBULATORY_CARE_PROVIDER_SITE_OTHER): Payer: Self-pay | Admitting: Nurse Practitioner

## 2020-01-29 ENCOUNTER — Encounter: Payer: Self-pay | Admitting: Oncology

## 2020-01-29 VITALS — BP 132/66 | HR 79 | Temp 96.3°F | Resp 20 | Wt 287.8 lb

## 2020-01-29 DIAGNOSIS — D509 Iron deficiency anemia, unspecified: Secondary | ICD-10-CM | POA: Diagnosis not present

## 2020-01-29 DIAGNOSIS — D696 Thrombocytopenia, unspecified: Secondary | ICD-10-CM | POA: Diagnosis not present

## 2020-01-29 DIAGNOSIS — C8318 Mantle cell lymphoma, lymph nodes of multiple sites: Secondary | ICD-10-CM

## 2020-01-29 DIAGNOSIS — I2510 Atherosclerotic heart disease of native coronary artery without angina pectoris: Secondary | ICD-10-CM | POA: Diagnosis not present

## 2020-01-29 DIAGNOSIS — R5383 Other fatigue: Secondary | ICD-10-CM | POA: Diagnosis not present

## 2020-01-29 DIAGNOSIS — R5381 Other malaise: Secondary | ICD-10-CM | POA: Diagnosis not present

## 2020-01-29 DIAGNOSIS — R531 Weakness: Secondary | ICD-10-CM | POA: Diagnosis not present

## 2020-01-29 DIAGNOSIS — C8310 Mantle cell lymphoma, unspecified site: Secondary | ICD-10-CM | POA: Diagnosis not present

## 2020-01-29 MED ORDER — NYSTATIN 100000 UNIT/GM EX CREA: 1.0000 "application " | TOPICAL_CREAM | Freq: Two times a day (BID) | CUTANEOUS | 0 refills | Status: DC

## 2020-01-29 NOTE — Progress Notes (Signed)
Patient states his blood pressure dropped and he fell a few days ago and suffered some pretty bad bruises on his arms and back. Patient denies any other concerns at this time.

## 2020-01-29 NOTE — Telephone Encounter (Signed)
LMTCB. Also need to reschedule 5/3 appt.

## 2020-01-29 NOTE — Telephone Encounter (Signed)
Pt aware of message below. Sent in nystatin cream- apply to affected area bid. Pt will let us know if does not clear.

## 2020-01-29 NOTE — Telephone Encounter (Signed)
Pt called returning your call  Also rescheduled pt for 5/13 @ 11am.. he wanted to do a phone visit

## 2020-01-30 DIAGNOSIS — I495 Sick sinus syndrome: Secondary | ICD-10-CM | POA: Diagnosis not present

## 2020-01-31 ENCOUNTER — Ambulatory Visit (INDEPENDENT_AMBULATORY_CARE_PROVIDER_SITE_OTHER): Payer: Medicare Other | Admitting: Nurse Practitioner

## 2020-01-31 ENCOUNTER — Encounter (INDEPENDENT_AMBULATORY_CARE_PROVIDER_SITE_OTHER): Payer: Self-pay

## 2020-01-31 ENCOUNTER — Other Ambulatory Visit: Payer: Self-pay

## 2020-01-31 VITALS — BP 127/74 | HR 84 | Resp 16

## 2020-01-31 DIAGNOSIS — L97201 Non-pressure chronic ulcer of unspecified calf limited to breakdown of skin: Secondary | ICD-10-CM

## 2020-01-31 NOTE — Progress Notes (Signed)
History of Present Illness  There is no documented history at this time  Assessments & Plan   There are no diagnoses linked to this encounter.    Additional instructions  Subjective:  Patient presents with venous ulcer of the Bilateral lower extremity.    Procedure:  3 layer unna wrap was placed Bilateral lower extremity.   Plan:   Follow up in one week.  

## 2020-02-01 ENCOUNTER — Encounter (INDEPENDENT_AMBULATORY_CARE_PROVIDER_SITE_OTHER): Payer: Self-pay | Admitting: Nurse Practitioner

## 2020-02-07 ENCOUNTER — Ambulatory Visit (INDEPENDENT_AMBULATORY_CARE_PROVIDER_SITE_OTHER): Payer: Medicare Other | Admitting: Nurse Practitioner

## 2020-02-07 ENCOUNTER — Encounter (INDEPENDENT_AMBULATORY_CARE_PROVIDER_SITE_OTHER): Payer: Self-pay

## 2020-02-07 ENCOUNTER — Other Ambulatory Visit: Payer: Self-pay

## 2020-02-07 VITALS — BP 134/71 | HR 59 | Resp 16 | Wt 288.0 lb

## 2020-02-07 DIAGNOSIS — L97201 Non-pressure chronic ulcer of unspecified calf limited to breakdown of skin: Secondary | ICD-10-CM | POA: Diagnosis not present

## 2020-02-07 NOTE — Progress Notes (Signed)
History of Present Illness  There is no documented history at this time  Assessments & Plan   There are no diagnoses linked to this encounter.    Additional instructions  Subjective:  Patient presents with venous ulcer of the Bilateral lower extremity.    Procedure:  3 layer unna wrap was placed Bilateral lower extremity.   Plan:   Follow up in one week.  

## 2020-02-08 ENCOUNTER — Encounter: Payer: Self-pay | Admitting: Podiatry

## 2020-02-08 ENCOUNTER — Other Ambulatory Visit: Payer: Self-pay

## 2020-02-08 ENCOUNTER — Ambulatory Visit (INDEPENDENT_AMBULATORY_CARE_PROVIDER_SITE_OTHER): Payer: Medicare Other | Admitting: Podiatry

## 2020-02-08 VITALS — Temp 98.3°F

## 2020-02-08 DIAGNOSIS — M79674 Pain in right toe(s): Secondary | ICD-10-CM | POA: Diagnosis not present

## 2020-02-08 DIAGNOSIS — M79675 Pain in left toe(s): Secondary | ICD-10-CM | POA: Diagnosis not present

## 2020-02-08 DIAGNOSIS — E1151 Type 2 diabetes mellitus with diabetic peripheral angiopathy without gangrene: Secondary | ICD-10-CM

## 2020-02-08 DIAGNOSIS — B351 Tinea unguium: Secondary | ICD-10-CM

## 2020-02-08 NOTE — Progress Notes (Signed)
This patient returns to my office for at risk foot care.  This patient requires this care by a professional since this patient will be at risk due to having chronic kidney disease and diabetes.  This patient is unable to cut nails himself since the patient cannot reach his nails.These nails are painful walking and wearing shoes.  This patient presents for at risk foot care today.  General Appearance  Alert, conversant and in no acute stress.  Vascular  Deferred due to unna boots.  Neurologic  Deferred due to unna boots.  Nails Thick disfigured discolored nails with subungual debris  from hallux to fifth toes bilaterally. No evidence of bacterial infection or drainage bilaterally.  Orthopedic  No limitations of motion  feet .  No crepitus or effusions noted.  No bony pathology or digital deformities noted.  Skin  normotropic skin with no porokeratosis noted bilaterally.  No signs of infections or ulcers noted.     Onychomycosis  Pain in right toes  Pain in left toes  Consent was obtained for treatment procedures.   Mechanical debridement of nails 1-5  bilaterally performed with a nail nipper.  Filed with dremel without incident.    Return office visit    3 months                 Told patient to return for periodic foot care and evaluation due to potential at risk complications.   [name] DPM

## 2020-02-09 ENCOUNTER — Encounter (INDEPENDENT_AMBULATORY_CARE_PROVIDER_SITE_OTHER): Payer: Self-pay | Admitting: Nurse Practitioner

## 2020-02-09 ENCOUNTER — Other Ambulatory Visit: Payer: Self-pay | Admitting: Internal Medicine

## 2020-02-09 NOTE — Telephone Encounter (Signed)
Refill request for nystatin cream, last seen 11-15-19, last filled 01-29-20.  Please advise.

## 2020-02-12 ENCOUNTER — Ambulatory Visit: Payer: Medicare Other | Admitting: Internal Medicine

## 2020-02-14 ENCOUNTER — Encounter (INDEPENDENT_AMBULATORY_CARE_PROVIDER_SITE_OTHER): Payer: Self-pay | Admitting: Nurse Practitioner

## 2020-02-14 ENCOUNTER — Ambulatory Visit (INDEPENDENT_AMBULATORY_CARE_PROVIDER_SITE_OTHER): Payer: Medicare Other | Admitting: Nurse Practitioner

## 2020-02-14 ENCOUNTER — Other Ambulatory Visit: Payer: Self-pay

## 2020-02-14 VITALS — BP 150/78 | HR 78 | Ht 70.0 in | Wt 289.0 lb

## 2020-02-14 DIAGNOSIS — I890 Lymphedema, not elsewhere classified: Secondary | ICD-10-CM

## 2020-02-14 DIAGNOSIS — L97201 Non-pressure chronic ulcer of unspecified calf limited to breakdown of skin: Secondary | ICD-10-CM

## 2020-02-14 DIAGNOSIS — I5022 Chronic systolic (congestive) heart failure: Secondary | ICD-10-CM

## 2020-02-14 NOTE — Progress Notes (Signed)
Subjective:    [name], male    DOB: Oct 05, 1946, 74 y.o.   MRN: [id number] Chief Complaint  Patient presents with  . Follow-up    unna Boot Follow up    Patient presents today for Unna wrap check.  The patient states that his lower extremity ulcerations have been doing very well and he has been tolerating his interacts very well.  The patient has no signs or symptoms of cellulitis today.  He denies any weeping through his bandages.  Overall the patient states that he feels much better and that his lower extremities feel much better.  He denies any fever, chills, nausea, vomiting or diarrhea.  He also states that his shortness of breath has been less than it previously was.   Review of Systems  Respiratory: Positive for shortness of breath.   Cardiovascular: Positive for leg swelling.  Skin: Positive for wound.  Neurological: Positive for weakness.  All other systems reviewed and are negative.      Objective:   Physical Exam Vitals reviewed.  Constitutional:      Appearance: Normal appearance.  HENT:     Head: Normocephalic.  Cardiovascular:     Rate and Rhythm: Normal rate and regular rhythm.     Pulses: Normal pulses.     Heart sounds: Normal heart sounds.  Pulmonary:     Effort: Pulmonary effort is normal.     Breath sounds: Normal breath sounds.  Musculoskeletal:     Right lower leg: 2+ Edema present.     Left lower leg: 2+ Edema present.     Comments: Cane for ambulation  Skin:    General: Skin is warm.     Capillary Refill: Capillary refill takes 2 to 3 seconds.     Comments: Dusky blue toes  Neurological:     Mental Status: He is alert and oriented to person, place, and time.  Psychiatric:        Mood and Affect: Mood normal.        Behavior: Behavior normal.        Thought Content: Thought content normal.        Judgment: Judgment normal.     BP (!) 150/78   Pulse 78   Ht 5\' 10"  (1.778 m)   Wt 289 lb (131.1 kg)   BMI 41.47 kg/m    Past Medical History:  Diagnosis Date  . CHF (congestive heart failure) ([place])   . Depression   . 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 Gastroparesis   . Graves disease   . Hypercholesterolemia   . Hypertension   . Mantle cell lymphoma ([place]) 07/2014  . Peripheral neuropathy     Social History   Socioeconomic History  . Marital status: Married    Spouse name: Not on file  . Number of children: 1  . Years of education: Not on file  . Highest education level: Not on file  Occupational History  . Not on file  Tobacco Use  . Smoking status: Former Research scientist (life sciences)  . Smokeless tobacco: Current User    Types: Chew  Substance and Sexual Activity  . Alcohol use: Yes    Alcohol/week: 3.0 standard drinks    Types: 3 Cans of beer per week    Comment: nightly  . Drug use: Never  . Sexual activity: Not Currently  Other Topics Concern  . Not on file  Social History Narrative  .  Not on file   Social Determinants of Health   Financial Resource Strain: Unknown  . Difficulty of Paying Living Expenses: Patient refused  Food Insecurity: Unknown  . Worried About Charity fundraiser in the Last Year: Patient refused  . Ran Out of Food in the Last Year: Patient refused  Transportation Needs: No Transportation Needs  . Lack of Transportation (Medical): No  . Lack of Transportation (Non-Medical): No  Physical Activity: Inactive  . Days of Exercise per Week: 0 days  . Minutes of Exercise per Session: 0 min  Stress: No Stress Concern Present  . Feeling of Stress : Not at all  Social Connections: Unknown  . Frequency of Communication with Friends and Family: Patient refused  . Frequency of Social Gatherings with Friends and Family: Patient refused  . Attends Religious Services: Patient refused  . Active Member of Clubs or Organizations: Patient refused  . Attends Archivist Meetings: Patient refused  . Marital Status: Patient  refused  Intimate Partner Violence: Unknown  . Fear of Current or Ex-Partner: Patient refused  . Emotionally Abused: Patient refused  . Physically Abused: Patient refused  . Sexually Abused: Patient refused    Past Surgical History:  Procedure Laterality Date  . CHOLECYSTECTOMY    . NEPHRECTOMY     right  . PACEMAKER INSERTION N/A 12/07/2017   Procedure: INSERTION PACEMAKER DUAL CHAMBER INITIAL INSERT;  Surgeon: Isaias Cowman, MD;  Location: [facility] ORS;  Service: Cardiovascular;  Laterality: N/A;  . PORTA CATH INSERTION N/A 11/03/2017   Procedure: PORTA CATH INSERTION;  Surgeon: Katha Cabal, MD;  Location: [facility];  Service: Cardiovascular;  Laterality: N/A;    Family History  Problem Relation Age of Onset  . Breast cancer Mother   . Diabetes Father   . Cancer Father        Lymphoma  . Alcohol abuse Maternal Uncle   . Diabetes Sister   . Heart disease Other        grandfather    Allergies  Allergen Reactions  . Rofecoxib Nausea Only       Assessment & Plan:   1. Lymphedema Patient will continue with other conservative therapy tactics such as elevation of his lower extremities and exercise when possible.  The patient will continue to adhere to his wrap therapy and provided that we can eliminate his ulcers we may see about taking a wrap vacation in the next few weeks.  2. Skin ulcer of calf, limited to breakdown of skin, unspecified laterality ([place]) The patient's ulcerations have nearly healed at this point.  There is some small ulcerative areas but the large, weeping and ulcerated areas have mostly healed.  There are still a couple of ulcers on the patient's feet and some on the calf but it is markedly improved.  We will continue to wrap the patient on a weekly basis.  3. Chronic systolic CHF (congestive heart failure) ([place]) This is definitely a contributing cause of the patient's continued edema.  Lately the patient has been taking an extra half  pill daily and this seems to be working for him, in regards to his leg swelling as well as shortness of breath, patient is advised to continue with the additional half tablet.   Current Outpatient Medications on File Prior to Visit  Medication Sig Dispense Refill  . ACCU-CHEK AVIVA PLUS test strip USE AS INSTRUCTED. USE 1 STRIP EVERY 3 HOURS . ACCU-CHECK AVIVA PLUS TEST STRIP. DX E10.649  12  .  acetaminophen (TYLENOL) 500 MG tablet Take 500 mg by mouth every 6 (six) hours as needed.     . cyanocobalamin (,VITAMIN B-12,) 1000 MCG/ML injection Inject into the muscle.    . ferrous sulfate 325 (65 FE) MG tablet Take 325 mg by mouth daily.    . furosemide (LASIX) 20 MG tablet Take 40 mg by mouth 2 (two) times daily.     Marland Kitchen GLUCAGON EMERGENCY 1 MG injection     . glucagon, human recombinant, (GLUCAGEN DIAGNOSTIC) 1 MG injection Use as directed    . glucose 4 GM chewable tablet Chew 1 tablet by mouth once as needed for low blood sugar.    Marland Kitchen glucose blood (PRECISION QID TEST) test strip Use 8 (eight) times daily as directed. BAYER CONTOUR NEXT TEST STRIPS E10.649    . insulin aspart (NOVOLOG) 100 UNIT/ML injection Basal rate 12am 0.8 units per hour, 9am 0.9 units per hour. (total basal insulin 20.7 units). Carbohydrate ratio 1 units for every 8gm of carbohydrate. Correction factor 1 units for every 50mg /dl over target cbg. Target CBG 80-120 1 vial 12  . levofloxacin (LEVAQUIN) 500 MG tablet Take 1 tablet (500 mg total) by mouth daily. 10 tablet 0  . levothyroxine (SYNTHROID) 25 MCG tablet Take by mouth.    Marland Kitchen lisinopril (ZESTRIL) 20 MG tablet Take 20 mg by mouth daily.    . Multiple Vitamin (MULTIVITAMIN) tablet Take 1 tablet by mouth 2 (two) times daily.     Marland Kitchen nystatin cream (MYCOSTATIN) APPLY TO AFFECTED AREA TWICE A DAY 30 g 0  . pravastatin (PRAVACHOL) 40 MG tablet Take 40 mg by mouth daily.     . prochlorperazine (COMPAZINE) 10 MG tablet TAKE 1 TABLET (10 MG TOTAL) BY MOUTH EVERY 6 (SIX) HOURS AS  NEEDED (NAUSEA OR VOMITING).    Marland Kitchen spironolactone (ALDACTONE) 25 MG tablet Take 25 mg by mouth daily.    . vitamin B-12 (CYANOCOBALAMIN) 250 MCG tablet Take 250 mcg by mouth daily.    . carvedilol (COREG) 3.125 MG tablet Take 3.125 mg by mouth 2 (two) times daily with a meal.      Current Facility-Administered Medications on File Prior to Visit  Medication Dose Route Frequency Provider Last Rate Last Admin  . heparin lock flush 100 unit/mL  500 Units Intravenous Once Lloyd Huger, MD      . sodium chloride flush (NS) 0.9 % injection 10 mL  10 mL Intravenous PRN Lloyd Huger, MD   10 mL at 02/01/18 0829  . sodium chloride flush (NS) 0.9 % injection 10 mL  10 mL Intravenous PRN Lloyd Huger, MD   10 mL at 04/05/18 0800    There are no Patient Instructions on file for this visit. No follow-ups on file.   Kris Hartmann, NP

## 2020-02-21 ENCOUNTER — Other Ambulatory Visit: Payer: Self-pay

## 2020-02-21 ENCOUNTER — Encounter (INDEPENDENT_AMBULATORY_CARE_PROVIDER_SITE_OTHER): Payer: Self-pay | Admitting: Nurse Practitioner

## 2020-02-21 ENCOUNTER — Ambulatory Visit (INDEPENDENT_AMBULATORY_CARE_PROVIDER_SITE_OTHER): Payer: Medicare Other | Admitting: Nurse Practitioner

## 2020-02-21 VITALS — BP 145/80 | HR 99 | Ht 70.0 in | Wt 287.0 lb

## 2020-02-21 DIAGNOSIS — R600 Localized edema: Secondary | ICD-10-CM | POA: Diagnosis not present

## 2020-02-21 DIAGNOSIS — L97201 Non-pressure chronic ulcer of unspecified calf limited to breakdown of skin: Secondary | ICD-10-CM

## 2020-02-21 DIAGNOSIS — Z905 Acquired absence of kidney: Secondary | ICD-10-CM | POA: Diagnosis not present

## 2020-02-21 DIAGNOSIS — E1029 Type 1 diabetes mellitus with other diabetic kidney complication: Secondary | ICD-10-CM | POA: Diagnosis not present

## 2020-02-21 DIAGNOSIS — N1832 Chronic kidney disease, stage 3b: Secondary | ICD-10-CM | POA: Diagnosis not present

## 2020-02-21 DIAGNOSIS — I10 Essential (primary) hypertension: Secondary | ICD-10-CM | POA: Diagnosis not present

## 2020-02-21 NOTE — Progress Notes (Signed)
History of Present Illness  There is no documented history at this time  Assessments & Plan   There are no diagnoses linked to this encounter.    Additional instructions  Subjective:  Patient presents with venous ulcer of the Bilateral lower extremity.    Procedure:  3 layer unna wrap was placed Bilateral lower extremity.   Plan:   Follow up in one week.  

## 2020-02-22 ENCOUNTER — Telehealth (INDEPENDENT_AMBULATORY_CARE_PROVIDER_SITE_OTHER): Payer: Medicare Other | Admitting: Internal Medicine

## 2020-02-22 ENCOUNTER — Ambulatory Visit: Payer: Medicare Other | Admitting: Internal Medicine

## 2020-02-22 DIAGNOSIS — R0602 Shortness of breath: Secondary | ICD-10-CM | POA: Diagnosis not present

## 2020-02-22 DIAGNOSIS — I495 Sick sinus syndrome: Secondary | ICD-10-CM

## 2020-02-22 DIAGNOSIS — L97509 Non-pressure chronic ulcer of other part of unspecified foot with unspecified severity: Secondary | ICD-10-CM

## 2020-02-22 DIAGNOSIS — E079 Disorder of thyroid, unspecified: Secondary | ICD-10-CM

## 2020-02-22 DIAGNOSIS — I10 Essential (primary) hypertension: Secondary | ICD-10-CM

## 2020-02-22 DIAGNOSIS — E7800 Pure hypercholesterolemia, unspecified: Secondary | ICD-10-CM | POA: Diagnosis not present

## 2020-02-22 DIAGNOSIS — N1832 Chronic kidney disease, stage 3b: Secondary | ICD-10-CM | POA: Diagnosis not present

## 2020-02-22 DIAGNOSIS — I429 Cardiomyopathy, unspecified: Secondary | ICD-10-CM

## 2020-02-22 DIAGNOSIS — C8318 Mantle cell lymphoma, lymph nodes of multiple sites: Secondary | ICD-10-CM | POA: Diagnosis not present

## 2020-02-22 DIAGNOSIS — E10621 Type 1 diabetes mellitus with foot ulcer: Secondary | ICD-10-CM | POA: Diagnosis not present

## 2020-02-22 DIAGNOSIS — L97201 Non-pressure chronic ulcer of unspecified calf limited to breakdown of skin: Secondary | ICD-10-CM | POA: Diagnosis not present

## 2020-02-22 DIAGNOSIS — I2510 Atherosclerotic heart disease of native coronary artery without angina pectoris: Secondary | ICD-10-CM

## 2020-02-22 DIAGNOSIS — W19XXXA Unspecified fall, initial encounter: Secondary | ICD-10-CM | POA: Diagnosis not present

## 2020-02-22 DIAGNOSIS — I890 Lymphedema, not elsewhere classified: Secondary | ICD-10-CM

## 2020-02-22 DIAGNOSIS — I428 Other cardiomyopathies: Secondary | ICD-10-CM

## 2020-02-22 NOTE — Progress Notes (Signed)
[name], male   DOB: 1946-02-28, 74 y.o.   MRN: [id number]   Virtual Visit via video Note  This visit type was conducted due to national recommendations for restrictions regarding the COVID-19 pandemic (e.g. social distancing).  This format is felt to be most appropriate for this patient at this time.  All issues noted in this document were discussed and addressed.  No physical exam was performed (except for noted visual exam findings with Video Visits).   I connected with Gary [name] by a video enabled telemedicine application and verified that I am speaking with the correct person using two identifiers. Location patient: home Location provider: work Persons participating in the virtual visit: patient, provider  The limitations, risks, security and privacy concerns of performing an evaluation and management service by telephone and the availability of in person appointments have been discussed.   It has also been discussed that there may be a charge for the visit.  The patient has expressed understanding and has agreed to proceed.   Reason for visit: scheduled follow up.    HPI: Scheduled follow up  - to follow regarding his diabetes, renal function, hypercholesterolemia and hypertension.  He reports he feels things are stable.  Seeing vascular surgery regularly for f/u lymphedema - unna wrap on a regular schedule.  Swelling is better.  Per note, ulcerations have nearly healed.  He reports his breathing is stable.  No chest pain.  Sees Dr Honor Junes for his diabetes.  Still has some low sugars at times.  Discussed importance of eating regular meals.  No nausea or vomiting reported.  No abdominal pain.  Saw nephrology.  Recommended continuing lasix and stopping aldactone.  [place] a few weeks ago.  States his feet got tangled up.  No LOC.  No syncope or near syncope.  No residual injuries.  No dizziness.  Discussed meals on wheels.     ROS: See pertinent positives and negatives per  HPI.  Past Medical History:  Diagnosis Date  . CHF (congestive heart failure) ([place])   . Depression   . 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 Gastroparesis   . Graves disease   . Hypercholesterolemia   . Hypertension   . Mantle cell lymphoma ([place]) 07/2014  . Peripheral neuropathy     Past Surgical History:  Procedure Laterality Date  . CHOLECYSTECTOMY    . NEPHRECTOMY     right  . PACEMAKER INSERTION N/A 12/07/2017   Procedure: INSERTION PACEMAKER DUAL CHAMBER INITIAL INSERT;  Surgeon: Isaias Cowman, MD;  Location: [facility];  Service: Cardiovascular;  Laterality: N/A;  . PORTA CATH INSERTION N/A 11/03/2017   Procedure: PORTA CATH INSERTION;  Surgeon: Katha Cabal, MD;  Location: [facility];  Service: Cardiovascular;  Laterality: N/A;    Family History  Problem Relation Age of Onset  . Breast cancer Mother   . Diabetes Father   . Cancer Father        Lymphoma  . Alcohol abuse Maternal Uncle   . Diabetes Sister   . Heart disease Other        grandfather    SOCIAL HX: reviewed.    Current Outpatient Medications:  .  ACCU-CHEK AVIVA PLUS test strip, USE AS INSTRUCTED. USE 1 STRIP EVERY 3 HOURS . ACCU-CHECK AVIVA PLUS TEST STRIP. DX E10.649, Disp: , Rfl: 12 .  acetaminophen (TYLENOL) 500 MG tablet, Take 500 mg by mouth every 6 (six)  hours as needed. , Disp: , Rfl:  .  carvedilol (COREG) 3.125 MG tablet, Take 3.125 mg by mouth 2 (two) times daily with a meal. , Disp: , Rfl:  .  cyanocobalamin (,VITAMIN B-12,) 1000 MCG/ML injection, Inject into the muscle., Disp: , Rfl:  .  ferrous sulfate 325 (65 FE) MG tablet, Take 325 mg by mouth daily., Disp: , Rfl:  .  furosemide (LASIX) 20 MG tablet, Take 40 mg by mouth 2 (two) times daily. , Disp: , Rfl:  .  GLUCAGON EMERGENCY 1 MG injection, , Disp: , Rfl:  .  glucagon, human recombinant, (GLUCAGEN DIAGNOSTIC) 1 MG injection, Use as directed, Disp: , Rfl:    .  glucose 4 GM chewable tablet, Chew 1 tablet by mouth once as needed for low blood sugar., Disp: , Rfl:  .  glucose blood (PRECISION QID TEST) test strip, Use 8 (eight) times daily as directed. BAYER CONTOUR NEXT TEST STRIPS E10.649, Disp: , Rfl:  .  insulin aspart (NOVOLOG) 100 UNIT/ML injection, Basal rate 12am 0.8 units per hour, 9am 0.9 units per hour. (total basal insulin 20.7 units). Carbohydrate ratio 1 units for every 8gm of carbohydrate. Correction factor 1 units for every 50mg /dl over target cbg. Target CBG 80-120, Disp: 1 vial, Rfl: 12 .  levofloxacin (LEVAQUIN) 500 MG tablet, Take 1 tablet (500 mg total) by mouth daily., Disp: 10 tablet, Rfl: 0 .  levothyroxine (SYNTHROID) 25 MCG tablet, Take by mouth., Disp: , Rfl:  .  lisinopril (ZESTRIL) 20 MG tablet, Take 20 mg by mouth daily., Disp: , Rfl:  .  Multiple Vitamin (MULTIVITAMIN) tablet, Take 1 tablet by mouth 2 (two) times daily. , Disp: , Rfl:  .  nystatin cream (MYCOSTATIN), APPLY TO AFFECTED AREA TWICE A DAY, Disp: 30 g, Rfl: 0 .  pravastatin (PRAVACHOL) 40 MG tablet, Take 40 mg by mouth daily. , Disp: , Rfl:  .  prochlorperazine (COMPAZINE) 10 MG tablet, TAKE 1 TABLET (10 MG TOTAL) BY MOUTH EVERY 6 (SIX) HOURS AS NEEDED (NAUSEA OR VOMITING)., Disp: , Rfl:  .  spironolactone (ALDACTONE) 25 MG tablet, Take 25 mg by mouth daily., Disp: , Rfl:  .  vitamin B-12 (CYANOCOBALAMIN) 250 MCG tablet, Take 250 mcg by mouth daily., Disp: , Rfl:  No current facility-administered medications for this visit.  Facility-Administered Medications Ordered in Other Visits:  .  heparin lock flush 100 unit/mL, 500 Units, Intravenous, Once, Finnegan, Kathlene November, MD .  sodium chloride flush (NS) 0.9 % injection 10 mL, 10 mL, Intravenous, PRN, Lloyd Huger, MD, 10 mL at 02/01/18 0829 .  sodium chloride flush (NS) 0.9 % injection 10 mL, 10 mL, Intravenous, PRN, [name], Kathlene November, MD, 10 mL at 04/05/18 0800  EXAM:  GENERAL: alert, oriented,  appears well and in no acute distress  HEENT: atraumatic, conjunttiva clear, no obvious abnormalities on inspection of external nose and ears  NECK: normal movements of the head and neck  LUNGS: on inspection no signs of respiratory distress, breathing rate appears normal, no obvious gross SOB, gasping or wheezing  CV: no obvious cyanosis  PSYCH/NEURO: pleasant and cooperative, no obvious depression or anxiety, speech and thought processing grossly intact  ASSESSMENT AND PLAN:  Discussed the following assessment and plan:  Thyroid disease Followed by endocrinology.    SOB (shortness of breath) Discussed with him today.  Overall he feels his breathing stable.  Follow.  Continue lisinopril and lasix.    Sick sinus syndrome [facility]) S/p pacemaker placement.  Mantle cell lymphoma ([place]) Followed by oncology.  They are monitoring at this time.  Stable.   Lymphedema Followed by vascular surgery.  Weekly unna wraps.    Lower limb ulcer, calf ([place]) Per vascular surgery - improved.  Continue f/u with vascular surgery.  Continue wraps.    Hypercholesterolemia On pravastatin.  Low cholesterol diet and exercise.  Follow lipid panel and liver function tests.    Fall Had the fall as outlined.  EMTs evaluated.  No residual pain.  Discussed support - Gary, cane.  Follow.    Essential hypertension, benign Blood pressure under good control.  Continue same medication regimen - coreg, lasix and lisinopril.  Nephrology instructed to stop aldactone.  Follow pressures.  Follow metabolic panel.    Diabetic foot ulcer associated with type 1 diabetes mellitus ([place]) Followed by Dr Honor Junes - on insulin - pump.  Discussed importance of eating regular meals to avoid low sugars.  Discussed meals on wheels.    CKD (chronic kidney disease) stage 3, GFR 30-59 ml/min Just evaluated by nephrology.  Instructed to stop aldactone.  Follow.    Cardiomyopathy, idiopathic ([place]) Followed by cardiology.  On  lasix.  Breathing stable.    No orders of the defined types were placed in this encounter.   No orders of the defined types were placed in this encounter.    I discussed the assessment and treatment plan with the patient. The patient was provided an opportunity to ask questions and all were answered. The patient agreed with the plan and demonstrated an understanding of the instructions.   The patient was advised to call back or seek an in-person evaluation if the symptoms worsen or if the condition fails to improve as anticipated.   [name], MD

## 2020-02-28 ENCOUNTER — Encounter (INDEPENDENT_AMBULATORY_CARE_PROVIDER_SITE_OTHER): Payer: Self-pay | Admitting: Nurse Practitioner

## 2020-02-28 ENCOUNTER — Other Ambulatory Visit: Payer: Self-pay

## 2020-02-28 ENCOUNTER — Ambulatory Visit (INDEPENDENT_AMBULATORY_CARE_PROVIDER_SITE_OTHER): Payer: Medicare Other | Admitting: Nurse Practitioner

## 2020-02-28 VITALS — BP 162/72 | HR 62 | Ht 70.0 in | Wt 289.0 lb

## 2020-02-28 DIAGNOSIS — E1159 Type 2 diabetes mellitus with other circulatory complications: Secondary | ICD-10-CM | POA: Diagnosis not present

## 2020-02-28 DIAGNOSIS — E1069 Type 1 diabetes mellitus with other specified complication: Secondary | ICD-10-CM | POA: Diagnosis not present

## 2020-02-28 DIAGNOSIS — L97201 Non-pressure chronic ulcer of unspecified calf limited to breakdown of skin: Secondary | ICD-10-CM | POA: Diagnosis not present

## 2020-02-28 DIAGNOSIS — E785 Hyperlipidemia, unspecified: Secondary | ICD-10-CM | POA: Diagnosis not present

## 2020-02-28 DIAGNOSIS — E10649 Type 1 diabetes mellitus with hypoglycemia without coma: Secondary | ICD-10-CM | POA: Diagnosis not present

## 2020-02-28 DIAGNOSIS — I152 Hypertension secondary to endocrine disorders: Secondary | ICD-10-CM | POA: Diagnosis not present

## 2020-02-28 NOTE — Progress Notes (Signed)
History of Present Illness  There is no documented history at this time  Assessments & Plan   There are no diagnoses linked to this encounter.    Additional instructions  Subjective:  Patient presents with venous ulcer of the Bilateral lower extremity.    Procedure:  3 layer unna wrap was placed Bilateral lower extremity.   Plan:   Follow up in one week.  

## 2020-03-04 ENCOUNTER — Encounter: Payer: Self-pay | Admitting: Internal Medicine

## 2020-03-04 NOTE — Assessment & Plan Note (Signed)
Had the fall as outlined.  EMTs evaluated.  No residual pain.  Discussed support - walker, cane.  Follow.

## 2020-03-04 NOTE — Assessment & Plan Note (Signed)
Discussed with him today.  Overall he feels his breathing stable.  Follow.  Continue lisinopril and lasix.

## 2020-03-04 NOTE — Assessment & Plan Note (Signed)
Followed by oncology.  They are monitoring at this time.  Stable.

## 2020-03-04 NOTE — Assessment & Plan Note (Signed)
On pravastatin.  Low cholesterol diet and exercise.  Follow lipid panel and liver function tests.   

## 2020-03-04 NOTE — Assessment & Plan Note (Signed)
Followed by endocrinology 

## 2020-03-04 NOTE — Assessment & Plan Note (Signed)
Per vascular surgery - improved.  Continue f/u with vascular surgery.  Continue wraps.

## 2020-03-04 NOTE — Assessment & Plan Note (Signed)
Followed by cardiology.  On lasix.  Breathing stable.

## 2020-03-04 NOTE — Assessment & Plan Note (Signed)
Just evaluated by nephrology.  Instructed to stop aldactone.  Follow.

## 2020-03-04 NOTE — Assessment & Plan Note (Signed)
Followed by vascular surgery.  Weekly unna wraps.

## 2020-03-04 NOTE — Assessment & Plan Note (Signed)
S/p pacemaker placement.

## 2020-03-04 NOTE — Assessment & Plan Note (Signed)
Blood pressure under good control.  Continue same medication regimen - coreg, lasix and lisinopril.  Nephrology instructed to stop aldactone.  Follow pressures.  Follow metabolic panel.

## 2020-03-04 NOTE — Assessment & Plan Note (Signed)
Followed by Dr Honor Junes - on insulin - pump.  Discussed importance of eating regular meals to avoid low sugars.  Discussed meals on wheels.

## 2020-03-06 ENCOUNTER — Ambulatory Visit (INDEPENDENT_AMBULATORY_CARE_PROVIDER_SITE_OTHER): Payer: Medicare Other | Admitting: Nurse Practitioner

## 2020-03-06 ENCOUNTER — Encounter (INDEPENDENT_AMBULATORY_CARE_PROVIDER_SITE_OTHER): Payer: Self-pay | Admitting: Nurse Practitioner

## 2020-03-06 ENCOUNTER — Other Ambulatory Visit: Payer: Self-pay

## 2020-03-06 ENCOUNTER — Ambulatory Visit (INDEPENDENT_AMBULATORY_CARE_PROVIDER_SITE_OTHER): Payer: Medicare Other

## 2020-03-06 VITALS — Ht 70.0 in | Wt 289.0 lb

## 2020-03-06 VITALS — BP 121/73 | HR 109 | Ht 70.0 in | Wt 284.0 lb

## 2020-03-06 DIAGNOSIS — Z0000 Encounter for general adult medical examination without abnormal findings: Secondary | ICD-10-CM | POA: Diagnosis not present

## 2020-03-06 DIAGNOSIS — L97201 Non-pressure chronic ulcer of unspecified calf limited to breakdown of skin: Secondary | ICD-10-CM

## 2020-03-06 NOTE — Progress Notes (Addendum)
Subjective:   Gary [name] is a 74 y.o. male who presents for Medicare Annual/Subsequent preventive examination.  Review of Systems:  No ROS.  Medicare Wellness Virtual Visit.  Visual/audio telehealth visit, UTA vital signs.   See social history for additional risk factors.   Cardiac Risk Factors include: advanced age (>46men, >67 women);hypertension;diabetes mellitus;smoking/ tobacco exposure;male gender     Objective:    Vitals: Ht 5\' 10"  (1.778 m)   Wt 289 lb (131.1 kg)   BMI 41.47 kg/m   Body mass index is 41.47 kg/m.  Advanced Directives 03/06/2020 01/26/2020 10/23/2019 08/28/2019 08/19/2019 07/25/2019 07/06/2018  Does Patient Have a Medical Advance Directive? No No No No No No No  Would patient like information on creating a medical advance directive? No - Patient declined No - Patient declined - No - Patient declined No - Guardian declined No - Patient declined No - Patient declined    Tobacco Social History   Tobacco Use  Smoking Status Former Smoker  Smokeless Tobacco Current User  . Types: Chew     Ready to quit: Not Answered Counseling given: Not Answered   Clinical Intake:  Pre-visit preparation completed: Yes        Diabetes: Yes(Followed by Endocrinology)  How often do you need to have someone help you when you read instructions, pamphlets, or other written materials from your doctor or pharmacy?: 1 - Never  Diabetes: Followed by Endocrinology  Diabetic Exams:  Diabetic Eye Exam: Overdue for diabetic eye exam. Pt has been advised about the importance in completing this exam. Notes appointment is scheduled with [facility], Dr. Jeni Salles next month.   Interpreter Needed?: No     Past Medical History:  Diagnosis Date  . CHF (congestive heart failure) ([place])   . Depression   . 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 Gastroparesis   . Graves disease   . Hypercholesterolemia   .  Hypertension   . Mantle cell lymphoma ([place]) 07/2014  . Peripheral neuropathy    Past Surgical History:  Procedure Laterality Date  . CHOLECYSTECTOMY    . NEPHRECTOMY     right  . PACEMAKER INSERTION N/A 12/07/2017   Procedure: INSERTION PACEMAKER DUAL CHAMBER INITIAL INSERT;  Surgeon: Isaias Cowman, MD;  Location: [facility] ORS;  Service: Cardiovascular;  Laterality: N/A;  . PORTA CATH INSERTION N/A 11/03/2017   Procedure: PORTA CATH INSERTION;  Surgeon: Katha Cabal, MD;  Location: [place] [facility];  Service: Cardiovascular;  Laterality: N/A;   Family History  Problem Relation Age of Onset  . Breast cancer Mother   . Diabetes Father   . Cancer Father        Lymphoma  . Alcohol abuse Maternal Uncle   . Diabetes Sister   . Heart disease Other        grandfather   Social History   Socioeconomic History  . Marital status: Married    Spouse name: Not on file  . Number of children: 1  . Years of education: Not on file  . Highest education level: Not on file  Occupational History  . Not on file  Tobacco Use  . Smoking status: Former Research scientist (life sciences)  . Smokeless tobacco: Current User    Types: Chew  Substance and Sexual Activity  . Alcohol use: Yes    Alcohol/week: 3.0 standard drinks    Types: 3 Cans of beer per week    Comment: nightly  .  Drug use: Never  . Sexual activity: Not Currently  Other Topics Concern  . Not on file  Social History Narrative  . Not on file   Social Determinants of Health   Financial Resource Strain: Unknown  . Difficulty of Paying Living Expenses: Patient refused  Food Insecurity: Unknown  . Worried About Charity fundraiser in the Last Year: Patient refused  . Ran Out of Food in the Last Year: Patient refused  Transportation Needs: No Transportation Needs  . Lack of Transportation (Medical): No  . Lack of Transportation (Non-Medical): No  Physical Activity: Inactive  . Days of Exercise per Week: 0 days  . Minutes of Exercise per  Session: 0 min  Stress: No Stress Concern Present  . Feeling of Stress : Not at all  Social Connections: Unknown  . Frequency of Communication with Friends and Family: Patient refused  . Frequency of Social Gatherings with Friends and Family: Patient refused  . Attends Religious Services: Patient refused  . Active Member of Clubs or Organizations: Patient refused  . Attends Archivist Meetings: Patient refused  . Marital Status: Patient refused    Outpatient Encounter Medications as of 03/06/2020  Medication Sig  . ACCU-CHEK AVIVA PLUS test strip USE AS INSTRUCTED. USE 1 STRIP EVERY 3 HOURS . ACCU-CHECK AVIVA PLUS TEST STRIP. DX W29.562  . acetaminophen (TYLENOL) 500 MG tablet Take 500 mg by mouth every 6 (six) hours as needed.   . carvedilol (COREG) 3.125 MG tablet Take 3.125 mg by mouth 2 (two) times daily with a meal.   . cyanocobalamin (,VITAMIN B-12,) 1000 MCG/ML injection Inject into the muscle.  . ferrous sulfate 325 (65 FE) MG tablet Take 325 mg by mouth daily.  . furosemide (LASIX) 20 MG tablet Take 40 mg by mouth 2 (two) times daily.   Marland Kitchen GLUCAGON EMERGENCY 1 MG injection   . glucagon, human recombinant, (GLUCAGEN DIAGNOSTIC) 1 MG injection Use as directed  . glucose 4 GM chewable tablet Chew 1 tablet by mouth once as needed for low blood sugar.  Marland Kitchen glucose blood (PRECISION QID TEST) test strip Use 8 (eight) times daily as directed. BAYER CONTOUR NEXT TEST STRIPS E10.649  . insulin aspart (NOVOLOG) 100 UNIT/ML injection Basal rate 12am 0.8 units per hour, 9am 0.9 units per hour. (total basal insulin 20.7 units). Carbohydrate ratio 1 units for every 8gm of carbohydrate. Correction factor 1 units for every 50mg /dl over target cbg. Target CBG 80-120  . levofloxacin (LEVAQUIN) 500 MG tablet Take 1 tablet (500 mg total) by mouth daily.  Marland Kitchen levothyroxine (SYNTHROID) 25 MCG tablet Take by mouth.  Marland Kitchen lisinopril (ZESTRIL) 20 MG tablet Take 20 mg by mouth daily.  . Multiple Vitamin  (MULTIVITAMIN) tablet Take 1 tablet by mouth 2 (two) times daily.   Marland Kitchen nystatin cream (MYCOSTATIN) APPLY TO AFFECTED AREA TWICE A DAY  . pravastatin (PRAVACHOL) 40 MG tablet Take 40 mg by mouth daily.   . prochlorperazine (COMPAZINE) 10 MG tablet TAKE 1 TABLET (10 MG TOTAL) BY MOUTH EVERY 6 (SIX) HOURS AS NEEDED (NAUSEA OR VOMITING).  Marland Kitchen spironolactone (ALDACTONE) 25 MG tablet Take 25 mg by mouth daily.  . vitamin B-12 (CYANOCOBALAMIN) 250 MCG tablet Take 250 mcg by mouth daily.   Facility-Administered Encounter Medications as of 03/06/2020  Medication  . heparin lock flush 100 unit/mL  . sodium chloride flush (NS) 0.9 % injection 10 mL  . sodium chloride flush (NS) 0.9 % injection 10 mL    Activities of  Daily Living In your present state of health, do you have any difficulty performing the following activities: 03/06/2020 12/01/2019  Hearing? N N  Vision? N N  Difficulty concentrating or making decisions? N Y  Walking or climbing stairs? Y Y  Comment Unsteady gait, cane in use -  Dressing or bathing? N Y  Doing errands, shopping? Y N  Comment He does not drive alone. SOBOE.Wife accompanies so he can stay in the car. -  Preparing Food and eating ? Y -  Comment Wife prepares meals. Self feeds. -  Using the Toilet? N -  In the past six months, have you accidently leaked urine? N -  Do you have problems with loss of bowel control? N -  Managing your Medications? N -  Managing your Finances? Y -  Comment Wife manages -  Housekeeping or managing your Housekeeping? Y -  Comment Wife manages -  Some recent data might be hidden    Patient Care Team: [name], MD as PCP - General (Internal Medicine) Lloyd Huger, MD as Consulting Physician (Oncology) Lucky Cowboy, Erskine Squibb, MD as Referring Physician (Vascular Surgery) Schnier, Dolores Lory, MD (Vascular Surgery) Corey Skains, MD as Consulting Physician (Cardiology)   Assessment:   This is a routine wellness examination for  Jaan.  I connected with Walker Shadow today by telephone and verified that I am speaking with the correct person using two identifiers. Location patient: home Location provider: work Persons participating in the virtual visit: patient, provider.   I discussed the limitations, risks, security and privacy concerns of performing an evaluation and management service by telephone and the availability of in person appointments. I also discussed with the patient that there may be a patient responsible charge related to this service. The patient expressed understanding and verbally consented to this telephonic visit.    Interactive audio and video telecommunications were attempted between this provider and patient, however failed, due to patient having technical difficulties OR patient did not have access to video capability.  We continued and completed visit with audio only.  Some vital signs may be absent or patient reported.   Time Spent with patient on telephone encounter: 30 minutes  Exercise Activities and Dietary recommendations Current Exercise Habits: The patient does not participate in regular exercise at present  Goals Addressed            This Visit's Progress   . Healthy Lifestyle       Low carb foods Stay hydrated Stay active (walking as tolerated/chair exercises as demonstrated)    . COMPLETED: Increase water intake         Fall Risk Fall Risk  03/06/2020 12/01/2019 08/31/2019 08/28/2019 02/09/2018  Falls in the past year? 1 1 0 1 Yes  Comment - - - - -  Number falls in past yr: 0 1 0 0 2 or more  Injury with Fall? 0 0 0 1 Yes  Risk Factor Category  - - - - High Fall Risk  Risk for fall due to : History of fall(s);Impaired balance/gait History of fall(s);Impaired balance/gait;Impaired mobility - History of fall(s) -  Follow up Falls evaluation completed Falls evaluation completed Falls evaluation completed Falls prevention discussed Falls evaluation completed;Education  provided;Falls prevention discussed   FALL RISK PREVENTION PERTAINING TO THE HOME: Home free of loose throw rugs in walkways, pet beds, electrical cords, etc? Yes Adequate lighting in your home to reduce risk of falls? Yes  ASSISTIVE DEVICES UTILIZED TO PREVENT FALLS: Life  alert? No. Cell phone kept on person. Use of a cane, walker or w/c? Yes, cane  TIMED UP AND GO:  Was the test performed? No, virtual visit.  Depression Screen PHQ 2/9 Scores 03/06/2020 09/04/2019 11/11/2017 10/22/2017  PHQ - 2 Score 0 0 0 0  PHQ- 9 Score - - - -    Cognitive Function MMSE - Mini Mental State Exam 10/22/2016  Orientation to time 5  Orientation to Place 5  Registration 3  Attention/ Calculation 5  Recall 3  Language- name 2 objects 2  Language- repeat 1  Language- follow 3 step command 3  Language- read & follow direction 1  Write a sentence 1  Copy design 1  Total score 30     6CIT Screen 03/06/2020 10/22/2017  What Year? 0 points 0 points  What month? 0 points 0 points  What time? 0 points 0 points  Count back from 20 0 points 0 points  Months in reverse 0 points 0 points  Repeat phrase 2 points 0 points  Total Score 2 0    Immunization History  Administered Date(s) Administered  . Fluad Quad(high Dose 65+) 07/10/2019  . Influenza Split 07/13/2014  . Influenza, High Dose Seasonal PF 10/22/2016, 10/21/2017, 09/14/2018  . Influenza, Seasonal, Injecte, Preservative Fre 08/27/2008  . Influenza,inj,Quad PF,6+ Mos 06/20/2013, 08/09/2015  . Influenza-Unspecified 06/20/2013, 08/09/2015  . Pneumococcal Conjugate-13 12/26/2014  . Pneumococcal Polysaccharide-23 10/22/2017  . Tdap 07/14/2018   Covid-19 Vaccine: Undecided if he wants to have the vaccine. Agrees to update office with immunization card if completed.   Screening Tests Health Maintenance  Topic Date Due  . OPHTHALMOLOGY EXAM  05/23/2015  . HEMOGLOBIN A1C  02/22/2020  . COVID-19 Vaccine (1) 03/22/2020 (Originally 07/07/1958)   . INFLUENZA VACCINE  05/12/2020  . FOOT EXAM  07/09/2020  . COLONOSCOPY  01/09/2024  . TETANUS/TDAP  07/14/2028  . Hepatitis C Screening  Completed  . PNA vac Low Risk Adult  Completed    Colorectal Screening: Completed 01/08/14. Repeat every 10 years.   Hepatitis C Screening: Completed 10/22/17  Dental Screening: Recommended annual dental exams for proper oral hygiene  Community Resource Referral: CRR required this visit?  No      Plan:  I have personally reviewed and addressed the Medicare Annual Wellness questionnaire and have noted the following in the patient's chart:  A. Medical and social history B. Use of alcohol, tobacco or illicit drugs  C. Current medications and supplements D. Functional ability and status E.  Nutritional status F.  Physical activity G. Advance directives H. List of other physicians I.  Hospitalizations, surgeries, and ER visits in previous 12 months J.  [place] such as hearing and vision if needed, cognitive and depression L. Referrals and appointments   I have reviewed and discussed with patient certain preventive protocols, quality metrics, and best practice recommendations.    Carie Caddy, LPN  5/63/1497 Nurse Health Advisor  Nurse Notes:   4 month follow up scheduled 06/24/20 @ 2:00   Reviewed above information.  Agree with plan.    [name]

## 2020-03-06 NOTE — Patient Instructions (Addendum)
  Gary [name] , Thank you for taking time to come for your Medicare Wellness Visit. I appreciate your ongoing commitment to your health goals. Please review the following plan we discussed and let me know if I can assist you in the future.   These are the goals we discussed: Goals    . Healthy Lifestyle     Low carb foods Stay hydrated Stay active (walking as tolerated/chair exercises as demonstrated)       This is a list of the screening recommended for you and due dates:  Health Maintenance  Topic Date Due  . Eye exam for diabetics  05/23/2015  . Hemoglobin A1C  02/22/2020  . COVID-19 Vaccine (1) 03/22/2020*  . Flu Shot  05/12/2020  . Complete foot exam   07/09/2020  . Colon Cancer Screening  01/09/2024  . Tetanus Vaccine  07/14/2028  .  Hepatitis C: One time screening is recommended by Center for Disease Control  (CDC) for  adults born from [birth year] through 1963.   Completed  . Pneumonia vaccines  Completed  *Topic was postponed. The date shown is not the original due date.

## 2020-03-06 NOTE — Progress Notes (Signed)
History of Present Illness  There is no documented history at this time  Assessments & Plan   There are no diagnoses linked to this encounter.    Additional instructions  Subjective:  Patient presents with venous ulcer of the Bilateral lower extremity.    Procedure:  3 layer unna wrap was placed Bilateral lower extremity.   Plan:   Follow up in one week.  

## 2020-03-13 ENCOUNTER — Ambulatory Visit (INDEPENDENT_AMBULATORY_CARE_PROVIDER_SITE_OTHER): Payer: Medicare Other | Admitting: Nurse Practitioner

## 2020-03-13 ENCOUNTER — Encounter (INDEPENDENT_AMBULATORY_CARE_PROVIDER_SITE_OTHER): Payer: Self-pay | Admitting: Nurse Practitioner

## 2020-03-13 ENCOUNTER — Other Ambulatory Visit: Payer: Self-pay

## 2020-03-13 VITALS — BP 148/77 | HR 96 | Resp 16 | Wt 286.0 lb

## 2020-03-13 DIAGNOSIS — L97201 Non-pressure chronic ulcer of unspecified calf limited to breakdown of skin: Secondary | ICD-10-CM | POA: Diagnosis not present

## 2020-03-13 NOTE — Progress Notes (Signed)
History of Present Illness  There is no documented history at this time  Assessments & Plan   There are no diagnoses linked to this encounter.    Additional instructions  Subjective:  Patient presents with venous ulcer of the Bilateral lower extremity.    Procedure:  3 layer unna wrap was placed Bilateral lower extremity.   Plan:   Follow up in one week.  

## 2020-03-19 ENCOUNTER — Ambulatory Visit (INDEPENDENT_AMBULATORY_CARE_PROVIDER_SITE_OTHER): Payer: Medicare Other | Admitting: Nurse Practitioner

## 2020-03-19 ENCOUNTER — Other Ambulatory Visit: Payer: Self-pay

## 2020-03-19 ENCOUNTER — Encounter (INDEPENDENT_AMBULATORY_CARE_PROVIDER_SITE_OTHER): Payer: Self-pay | Admitting: Nurse Practitioner

## 2020-03-19 VITALS — BP 139/83 | HR 118 | Ht 69.0 in | Wt 286.0 lb

## 2020-03-19 DIAGNOSIS — I890 Lymphedema, not elsewhere classified: Secondary | ICD-10-CM | POA: Diagnosis not present

## 2020-03-19 DIAGNOSIS — L97201 Non-pressure chronic ulcer of unspecified calf limited to breakdown of skin: Secondary | ICD-10-CM

## 2020-03-19 DIAGNOSIS — E7800 Pure hypercholesterolemia, unspecified: Secondary | ICD-10-CM | POA: Diagnosis not present

## 2020-03-19 NOTE — Progress Notes (Signed)
Subjective:    [name], male    DOB: Jul 04, 1946, 74 y.o.   MRN: [id number] Chief Complaint  Patient presents with   Follow-up    Unna boot check    Patient presents today for Unna wrap check.  The patient states that his lower extremity ulcerations have been doing very well and he has been tolerating his Unna wraps very well.  However, the patient does have some small minor skin tears bilaterally.  The patient has no signs or symptoms of cellulitis today.  He denies any weeping through his bandages.  Overall the patient states that he feels much better and that his lower extremities feel much better.  He denies any fever, chills, nausea, vomiting or diarrhea.  He also states that his shortness of breath has been somewhat variable, with some days being better than others.   Review of Systems  Respiratory: Positive for shortness of breath.   Musculoskeletal: Positive for gait problem.  Skin: Positive for wound.  Neurological: Positive for weakness.       Objective:   Physical Exam Vitals reviewed.  HENT:     Head: Normocephalic.  Cardiovascular:     Rate and Rhythm: Normal rate and regular rhythm.  Pulmonary:     Effort: Pulmonary effort is normal.  Musculoskeletal:     Right lower leg: 2+ Edema present.     Left lower leg: 2+ Edema present.  Skin:    Capillary Refill: Capillary refill takes 2 to 3 seconds.  Neurological:     Mental Status: He is alert.     Motor: Weakness present.     Gait: Gait abnormal.  Psychiatric:        Mood and Affect: Mood normal.        Behavior: Behavior normal.        Thought Content: Thought content normal.        Judgment: Judgment normal.     BP 139/83    Pulse (!) 118    Ht 5\' 9"  (1.753 m)    Wt 286 lb (129.7 kg)    BMI 42.23 kg/m   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 with macular  edema    Gastroparesis    Graves disease    Hypercholesterolemia    Hypertension    Mantle cell lymphoma ([place]) 07/2014   Peripheral neuropathy     Social History   Socioeconomic History   Marital status: Married    Spouse name: Not on file   Number of children: 1   Years of education: Not on file   Highest education level: Not on file  Occupational History   Not on file  Tobacco Use   Smoking status: Former Smoker   Smokeless tobacco: Current User    Types: Chew  Substance and Sexual Activity   Alcohol use: Yes    Alcohol/week: 3.0 standard drinks    Types: 3 Cans of beer per week    Comment: nightly   Drug use: Never   Sexual activity: Not Currently  Other Topics Concern   Not on file  Social History Narrative   Not on file   Social Determinants of Health   Financial Resource Strain: Unknown   Difficulty of Paying Living Expenses: Patient refused  Food Insecurity: Unknown   Worried About Charity fundraiser in the Last  Year: Patient refused   Ran Out of Food in the Last Year: Patient refused  Transportation Needs: No Transportation Needs   Lack of Transportation (Medical): No   Lack of Transportation (Non-Medical): No  Physical Activity: Inactive   Days of Exercise per Week: 0 days   Minutes of Exercise per Session: 0 min  Stress: No Stress Concern Present   Feeling of Stress : Not at all  Social Connections: Unknown   Frequency of Communication with Friends and Family: Patient refused   Frequency of Social Gatherings with Friends and Family: Patient refused   Attends Religious Services: Patient refused   Marine scientist or Organizations: Patient refused   Attends Archivist Meetings: Patient refused   Marital Status: Patient refused  Intimate Partner Violence: Unknown   Fear of Current or Ex-Partner: Patient refused   Emotionally Abused: Patient refused   Physically Abused: Patient refused   Sexually  Abused: Patient refused    Past Surgical History:  Procedure Laterality Date   CHOLECYSTECTOMY     NEPHRECTOMY     right   PACEMAKER INSERTION N/A 12/07/2017   Procedure: INSERTION PACEMAKER DUAL CHAMBER INITIAL INSERT;  Surgeon: Isaias Cowman, MD;  Location: [facility] ORS;  Service: Cardiovascular;  Laterality: N/A;   PORTA CATH INSERTION N/A 11/03/2017   Procedure: PORTA CATH INSERTION;  Surgeon: Katha Cabal, MD;  Location: [place] [facility];  Service: Cardiovascular;  Laterality: N/A;    Family History  Problem Relation Age of Onset   Breast cancer Mother    Diabetes Father    Cancer Father        Lymphoma   Alcohol abuse Maternal Uncle    Diabetes Sister    Heart disease Other        grandfather    Allergies  Allergen Reactions   Rofecoxib Nausea Only       Assessment & Plan:   1. Skin ulcer of calf, limited to breakdown of skin, unspecified laterality ([place]) The patient's ulcerations have nearly healed at this point.    The large weeping areas have healed.  The patient does have some small skin tears bilaterally. There are still a couple of ulcers on the patient's feet and some on the calf but it is markedly improved.  We will continue to wrap the patient on a weekly basis.  2. Lymphedema Patient is advised to continue with conservative therapy tactics such as elevation of his lower extremities as well as exercising tolerable.  We will have the patient maintain his wrap schedule of office visits on a weekly basis with follow-up every 4 weeks for evaluation of lower extremity edema.  3. Hypercholesterolemia Continue statin as ordered and reviewed, no changes at this time    Current Outpatient Medications on File Prior to Visit  Medication Sig Dispense Refill   ACCU-CHEK AVIVA PLUS test strip USE AS INSTRUCTED. USE 1 STRIP EVERY 3 HOURS . ACCU-CHECK AVIVA PLUS TEST STRIP. DX E10.649  12   acetaminophen (TYLENOL) 500 MG tablet Take 500 mg by  mouth every 6 (six) hours as needed.      cyanocobalamin (,VITAMIN B-12,) 1000 MCG/ML injection Inject into the muscle.     ferrous sulfate 325 (65 FE) MG tablet Take 325 mg by mouth daily.     furosemide (LASIX) 20 MG tablet Take 40 mg by mouth 2 (two) times daily.      GLUCAGON EMERGENCY 1 MG injection      glucagon, human recombinant, (GLUCAGEN  DIAGNOSTIC) 1 MG injection Use as directed     glucose 4 GM chewable tablet Chew 1 tablet by mouth once as needed for low blood sugar.     glucose blood (PRECISION QID TEST) test strip Use 8 (eight) times daily as directed. BAYER CONTOUR NEXT TEST STRIPS E10.649     insulin aspart (NOVOLOG) 100 UNIT/ML injection Basal rate 12am 0.8 units per hour, 9am 0.9 units per hour. (total basal insulin 20.7 units). Carbohydrate ratio 1 units for every 8gm of carbohydrate. Correction factor 1 units for every 50mg /dl over target cbg. Target CBG 80-120 1 vial 12   levofloxacin (LEVAQUIN) 500 MG tablet Take 1 tablet (500 mg total) by mouth daily. 10 tablet 0   levothyroxine (SYNTHROID) 25 MCG tablet Take by mouth.     lisinopril (ZESTRIL) 20 MG tablet Take 20 mg by mouth daily.     Multiple Vitamin (MULTIVITAMIN) tablet Take 1 tablet by mouth 2 (two) times daily.      nystatin cream (MYCOSTATIN) APPLY TO AFFECTED AREA TWICE A DAY 30 g 0   pravastatin (PRAVACHOL) 40 MG tablet Take 40 mg by mouth daily.      prochlorperazine (COMPAZINE) 10 MG tablet TAKE 1 TABLET (10 MG TOTAL) BY MOUTH EVERY 6 (SIX) HOURS AS NEEDED (NAUSEA OR VOMITING).     spironolactone (ALDACTONE) 25 MG tablet Take 25 mg by mouth daily.     vitamin B-12 (CYANOCOBALAMIN) 250 MCG tablet Take 250 mcg by mouth daily.     carvedilol (COREG) 3.125 MG tablet Take 3.125 mg by mouth 2 (two) times daily with a meal.      Current Facility-Administered Medications on File Prior to Visit  Medication Dose Route Frequency Provider Last Rate Last Admin   heparin lock flush 100 unit/mL  500 Units  Intravenous Once Lloyd Huger, MD       sodium chloride flush (NS) 0.9 % injection 10 mL  10 mL Intravenous PRN Lloyd Huger, MD   10 mL at 02/01/18 0829   sodium chloride flush (NS) 0.9 % injection 10 mL  10 mL Intravenous PRN Lloyd Huger, MD   10 mL at 04/05/18 0800    There are no Patient Instructions on file for this visit. No follow-ups on file.   Kris Hartmann, NP

## 2020-03-20 DIAGNOSIS — E119 Type 2 diabetes mellitus without complications: Secondary | ICD-10-CM | POA: Diagnosis not present

## 2020-03-20 LAB — HM DIABETES EYE EXAM

## 2020-03-25 ENCOUNTER — Inpatient Hospital Stay: Payer: Medicare Other | Attending: Oncology

## 2020-03-25 ENCOUNTER — Other Ambulatory Visit: Payer: Self-pay

## 2020-03-25 DIAGNOSIS — Z95828 Presence of other vascular implants and grafts: Secondary | ICD-10-CM

## 2020-03-25 DIAGNOSIS — Z452 Encounter for adjustment and management of vascular access device: Secondary | ICD-10-CM | POA: Insufficient documentation

## 2020-03-25 DIAGNOSIS — C8310 Mantle cell lymphoma, unspecified site: Secondary | ICD-10-CM | POA: Insufficient documentation

## 2020-03-25 MED ORDER — HEPARIN SOD (PORK) LOCK FLUSH 100 UNIT/ML IV SOLN
500.0000 [IU] | Freq: Once | INTRAVENOUS | Status: AC
  Administered 2020-03-25: 500 [IU] via INTRAVENOUS
  Filled 2020-03-25: qty 5

## 2020-03-25 MED ORDER — SODIUM CHLORIDE 0.9% FLUSH
10.0000 mL | Freq: Once | INTRAVENOUS | Status: AC
  Administered 2020-03-25: 10 mL via INTRAVENOUS
  Filled 2020-03-25: qty 10

## 2020-03-25 MED ORDER — HEPARIN SOD (PORK) LOCK FLUSH 100 UNIT/ML IV SOLN
INTRAVENOUS | Status: AC
  Filled 2020-03-25: qty 5

## 2020-03-26 ENCOUNTER — Ambulatory Visit (INDEPENDENT_AMBULATORY_CARE_PROVIDER_SITE_OTHER): Payer: Medicare Other | Admitting: Nurse Practitioner

## 2020-03-26 ENCOUNTER — Encounter (INDEPENDENT_AMBULATORY_CARE_PROVIDER_SITE_OTHER): Payer: Self-pay

## 2020-03-26 VITALS — BP 128/78 | HR 98 | Resp 16 | Wt 285.2 lb

## 2020-03-26 DIAGNOSIS — L97201 Non-pressure chronic ulcer of unspecified calf limited to breakdown of skin: Secondary | ICD-10-CM

## 2020-03-26 NOTE — Progress Notes (Signed)
History of Present Illness  There is no documented history at this time  Assessments & Plan   There are no diagnoses linked to this encounter.    Additional instructions  Subjective:  Patient presents with venous ulcer of the Bilateral lower extremity.    Procedure:  3 layer unna wrap was placed Bilateral lower extremity.   Plan:   Follow up in one week.  

## 2020-04-02 ENCOUNTER — Ambulatory Visit (INDEPENDENT_AMBULATORY_CARE_PROVIDER_SITE_OTHER): Payer: Medicare Other | Admitting: Nurse Practitioner

## 2020-04-02 ENCOUNTER — Other Ambulatory Visit: Payer: Self-pay

## 2020-04-02 ENCOUNTER — Encounter (INDEPENDENT_AMBULATORY_CARE_PROVIDER_SITE_OTHER): Payer: Self-pay | Admitting: Nurse Practitioner

## 2020-04-02 VITALS — BP 163/73 | HR 60 | Ht 70.0 in | Wt 281.0 lb

## 2020-04-02 DIAGNOSIS — L97201 Non-pressure chronic ulcer of unspecified calf limited to breakdown of skin: Secondary | ICD-10-CM | POA: Diagnosis not present

## 2020-04-02 NOTE — Progress Notes (Signed)
History of Present Illness  There is no documented history at this time  Assessments & Plan   There are no diagnoses linked to this encounter.    Additional instructions  Subjective:  Patient presents with venous ulcer of the Bilateral lower extremity.    Procedure:  3 layer unna wrap was placed Bilateral lower extremity.   Plan:   Follow up in one week.  

## 2020-04-09 ENCOUNTER — Other Ambulatory Visit: Payer: Self-pay

## 2020-04-09 ENCOUNTER — Ambulatory Visit (INDEPENDENT_AMBULATORY_CARE_PROVIDER_SITE_OTHER): Payer: Medicare Other | Admitting: Nurse Practitioner

## 2020-04-09 ENCOUNTER — Encounter (INDEPENDENT_AMBULATORY_CARE_PROVIDER_SITE_OTHER): Payer: Self-pay | Admitting: Nurse Practitioner

## 2020-04-09 ENCOUNTER — Other Ambulatory Visit (INDEPENDENT_AMBULATORY_CARE_PROVIDER_SITE_OTHER): Payer: Self-pay | Admitting: Nurse Practitioner

## 2020-04-09 VITALS — BP 132/65 | HR 103 | Ht 70.0 in | Wt 284.0 lb

## 2020-04-09 DIAGNOSIS — L97201 Non-pressure chronic ulcer of unspecified calf limited to breakdown of skin: Secondary | ICD-10-CM

## 2020-04-09 MED ORDER — AMOXICILLIN-POT CLAVULANATE 875-125 MG PO TABS: 1.0000 | ORAL_TABLET | Freq: Two times a day (BID) | ORAL | 0 refills | Status: DC

## 2020-04-09 NOTE — Progress Notes (Signed)
History of Present Illness  There is no documented history at this time  Assessments & Plan   There are no diagnoses linked to this encounter.    Additional instructions  Subjective:  Patient presents with venous ulcer of the Bilateral lower extremity.    Procedure:  3 layer unna wrap was placed Bilateral lower extremity.   Plan:   Follow up in one week.  

## 2020-04-12 LAB — AEROBIC CULTURE

## 2020-04-16 ENCOUNTER — Other Ambulatory Visit: Payer: Self-pay

## 2020-04-16 ENCOUNTER — Ambulatory Visit (INDEPENDENT_AMBULATORY_CARE_PROVIDER_SITE_OTHER): Payer: Medicare Other | Admitting: Nurse Practitioner

## 2020-04-16 ENCOUNTER — Encounter (INDEPENDENT_AMBULATORY_CARE_PROVIDER_SITE_OTHER): Payer: Self-pay | Admitting: Nurse Practitioner

## 2020-04-16 VITALS — BP 116/71 | HR 94 | Ht 70.0 in | Wt 284.0 lb

## 2020-04-16 DIAGNOSIS — I10 Essential (primary) hypertension: Secondary | ICD-10-CM

## 2020-04-16 DIAGNOSIS — L03119 Cellulitis of unspecified part of limb: Secondary | ICD-10-CM

## 2020-04-16 DIAGNOSIS — L97201 Non-pressure chronic ulcer of unspecified calf limited to breakdown of skin: Secondary | ICD-10-CM | POA: Diagnosis not present

## 2020-04-16 NOTE — Progress Notes (Signed)
Subjective:    [name], male    DOB: [birth year], 74 y.o.   MRN: [id number] Chief Complaint  Patient presents with  . Follow-up    unna boot check    Patient presents today for Unna wrap check.  The lower extremity ulcerations have deteriorated today some.  The patient was previously started on Augmentin last week for cellulitis.  A culture of his legs revealed normal skin flora with gram-negative rods.  The patient is also started to weep.  He also endorses having some discomfort on his feet and ankle area.  He denies any fever, chills, nausea, vomiting or diarrhea.  The patient has not had any changes in his heart failure medications.  He continues to take the same diuretics.  He denies any worsening shortness of breath that is consistent with previous exacerbations.      Review of Systems  Respiratory: Positive for shortness of breath (with exertion).   Cardiovascular: Positive for leg swelling.  Skin: Positive for wound.  Neurological: Positive for weakness.  All other systems reviewed and are negative.      Objective:   Physical Exam Vitals reviewed.  HENT:     Head: Normocephalic.  Cardiovascular:     Rate and Rhythm: Normal rate and regular rhythm.     Pulses: Normal pulses.     Heart sounds: Normal heart sounds.  Pulmonary:     Effort: Pulmonary effort is normal.     Breath sounds: Normal breath sounds.  Skin:    General: Skin is warm.     Findings: Erythema present.     Comments: weeping  Neurological:     Mental Status: He is alert and oriented to person, place, and time.     Motor: Weakness present.  Psychiatric:        Mood and Affect: Mood normal.        Behavior: Behavior normal.        Thought Content: Thought content normal.        Judgment: Judgment normal.     BP 116/71   Pulse 94   Ht 5\' 10"  (1.778 m)   Wt 284 lb (128.8 kg)   BMI 40.75 kg/m   Past Medical History:  Diagnosis Date  . CHF (congestive heart failure) ([place])     . Depression   . 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 Gastroparesis   . Graves disease   . Hypercholesterolemia   . Hypertension   . Mantle cell lymphoma ([place]) 07/2014  . Peripheral neuropathy     Social History   Socioeconomic History  . Marital status: Married    Spouse name: Not on file  . Number of children: 1  . Years of education: Not on file  . Highest education level: Not on file  Occupational History  . Not on file  Tobacco Use  . Smoking status: Former Research scientist (life sciences)  . Smokeless tobacco: Current User    Types: Chew  Vaping Use  . Vaping Use: Never used  Substance and Sexual Activity  . Alcohol use: Yes    Alcohol/week: 3.0 standard drinks    Types: 3 Cans of beer per week    Comment: nightly  . Drug use: Never  . Sexual activity: Not Currently  Other Topics Concern  . Not on file  Social History Narrative  . Not on file   Social Determinants of Health   Financial Resource Strain:  Unknown  . Difficulty of Paying Living Expenses: Patient refused  Food Insecurity: Unknown  . Worried About Charity fundraiser in the Last Year: Patient refused  . Ran Out of Food in the Last Year: Patient refused  Transportation Needs: No Transportation Needs  . Lack of Transportation (Medical): No  . Lack of Transportation (Non-Medical): No  Physical Activity: Inactive  . Days of Exercise per Week: 0 days  . Minutes of Exercise per Session: 0 min  Stress: No Stress Concern Present  . Feeling of Stress : Not at all  Social Connections: Unknown  . Frequency of Communication with Friends and Family: Patient refused  . Frequency of Social Gatherings with Friends and Family: Patient refused  . Attends Religious Services: Patient refused  . Active Member of Clubs or Organizations: Patient refused  . Attends Archivist Meetings: Patient refused  . Marital Status: Patient refused  Intimate Partner Violence: Unknown   . Fear of Current or Ex-Partner: Patient refused  . Emotionally Abused: Patient refused  . Physically Abused: Patient refused  . Sexually Abused: Patient refused    Past Surgical History:  Procedure Laterality Date  . CHOLECYSTECTOMY    . NEPHRECTOMY     right  . PACEMAKER INSERTION N/A 12/07/2017   Procedure: INSERTION PACEMAKER DUAL CHAMBER INITIAL INSERT;  Surgeon: Isaias Cowman, MD;  Location: [facility] ORS;  Service: Cardiovascular;  Laterality: N/A;  . PORTA CATH INSERTION N/A 11/03/2017   Procedure: PORTA CATH INSERTION;  Surgeon: Katha Cabal, MD;  Location: [facility];  Service: Cardiovascular;  Laterality: N/A;    Family History  Problem Relation Age of Onset  . Breast cancer Mother   . Diabetes Father   . Cancer Father        Lymphoma  . Alcohol abuse Maternal Uncle   . Diabetes Sister   . Heart disease Other        grandfather    Allergies  Allergen Reactions  . Rofecoxib Nausea Only       Assessment & Plan:   1. Skin ulcer of calf, limited to breakdown of skin, unspecified laterality ([place]) Patient skin ulceration has not significantly decreased.  Unfortunately, the patient has a cycle which consist of worsening swelling and weeping and worsening of the wounds followed by drying out of the wound.  This usually coincides with the worsening of his heart failure however patient weights have been mainly consistent and he is not short of breath as he usually is of these exacerbations.  We will see if medication changes and additional wraps will help.  Patient also will be following up with his heart failure specialist soon.  2. Cellulitis of lower extremity, unspecified laterality Patient still has another week of antibiotics.  Previous culture shows normal skin flora with gram-negative rods.  Patient was prescribed Augmentin.  Depending on patient's response we may switch agent at patient's follow-up visit.  Otherwise, we will return to biweekly Unna  wrap changes as the patient has started to weep.  We will have the patient follow-up in 4 weeks for interim checks however sooner checks can be done if the nursing staff notices significant changes.  3. Essential hypertension, benign Continue antihypertensive medications as already ordered, these medications have been reviewed and there are no changes at this time.    Current Outpatient Medications on File Prior to Visit  Medication Sig Dispense Refill  . ACCU-CHEK AVIVA PLUS test strip USE AS INSTRUCTED. USE 1 STRIP EVERY 3  HOURS . ACCU-CHECK AVIVA PLUS TEST STRIP. DX E10.649  12  . acetaminophen (TYLENOL) 500 MG tablet Take 500 mg by mouth every 6 (six) hours as needed.     Marland Kitchen amoxicillin-clavulanate (AUGMENTIN) 875-125 MG tablet Take 1 tablet by mouth 2 (two) times daily. 24 tablet 0  . cyanocobalamin (,VITAMIN B-12,) 1000 MCG/ML injection Inject into the muscle.    . ferrous sulfate 325 (65 FE) MG tablet Take 325 mg by mouth daily.    . furosemide (LASIX) 20 MG tablet Take 40 mg by mouth 2 (two) times daily.     Marland Kitchen GLUCAGON EMERGENCY 1 MG injection     . glucagon, human recombinant, (GLUCAGEN DIAGNOSTIC) 1 MG injection Use as directed    . glucose 4 GM chewable tablet Chew 1 tablet by mouth once as needed for low blood sugar.    Marland Kitchen glucose blood (PRECISION QID TEST) test strip Use 8 (eight) times daily as directed. BAYER CONTOUR NEXT TEST STRIPS E10.649    . insulin aspart (NOVOLOG) 100 UNIT/ML injection Basal rate 12am 0.8 units per hour, 9am 0.9 units per hour. (total basal insulin 20.7 units). Carbohydrate ratio 1 units for every 8gm of carbohydrate. Correction factor 1 units for every 50mg /dl over target cbg. Target CBG 80-120 1 vial 12  . levofloxacin (LEVAQUIN) 500 MG tablet Take 1 tablet (500 mg total) by mouth daily. 10 tablet 0  . levothyroxine (SYNTHROID) 25 MCG tablet Take by mouth.    Marland Kitchen lisinopril (ZESTRIL) 20 MG tablet Take 20 mg by mouth daily.    . Multiple Vitamin  (MULTIVITAMIN) tablet Take 1 tablet by mouth 2 (two) times daily.     Marland Kitchen nystatin cream (MYCOSTATIN) APPLY TO AFFECTED AREA TWICE A DAY 30 g 0  . pravastatin (PRAVACHOL) 40 MG tablet Take 40 mg by mouth daily.     . prochlorperazine (COMPAZINE) 10 MG tablet TAKE 1 TABLET (10 MG TOTAL) BY MOUTH EVERY 6 (SIX) HOURS AS NEEDED (NAUSEA OR VOMITING).    Marland Kitchen spironolactone (ALDACTONE) 25 MG tablet Take 25 mg by mouth daily.    . vitamin B-12 (CYANOCOBALAMIN) 250 MCG tablet Take 250 mcg by mouth daily.    . Vitamin D, Cholecalciferol, 10 MCG (400 UNIT) TABS Take by mouth.    . carvedilol (COREG) 3.125 MG tablet Take 3.125 mg by mouth 2 (two) times daily with a meal.      Current Facility-Administered Medications on File Prior to Visit  Medication Dose Route Frequency Provider Last Rate Last Admin  . heparin lock flush 100 unit/mL  500 Units Intravenous Once Lloyd Huger, MD      . sodium chloride flush (NS) 0.9 % injection 10 mL  10 mL Intravenous PRN Lloyd Huger, MD   10 mL at 02/01/18 0829  . sodium chloride flush (NS) 0.9 % injection 10 mL  10 mL Intravenous PRN Lloyd Huger, MD   10 mL at 04/05/18 0800    There are no Patient Instructions on file for this visit. No follow-ups on file.   Kris Hartmann, NP

## 2020-04-17 ENCOUNTER — Other Ambulatory Visit: Payer: Self-pay | Admitting: Internal Medicine

## 2020-04-19 ENCOUNTER — Ambulatory Visit (INDEPENDENT_AMBULATORY_CARE_PROVIDER_SITE_OTHER): Payer: Medicare Other | Admitting: Nurse Practitioner

## 2020-04-19 ENCOUNTER — Other Ambulatory Visit: Payer: Self-pay

## 2020-04-19 VITALS — BP 133/76 | HR 92 | Resp 18 | Ht 70.0 in | Wt 283.0 lb

## 2020-04-19 DIAGNOSIS — E10621 Type 1 diabetes mellitus with foot ulcer: Secondary | ICD-10-CM

## 2020-04-19 DIAGNOSIS — L97509 Non-pressure chronic ulcer of other part of unspecified foot with unspecified severity: Secondary | ICD-10-CM | POA: Diagnosis not present

## 2020-04-19 NOTE — Progress Notes (Signed)
History of Present Illness  There is no documented history at this time  Assessments & Plan   There are no diagnoses linked to this encounter.    Additional instructions  Subjective:  Patient presents with venous ulcer of the Bilateral lower extremity.    Procedure:  3 layer unna wrap was placed Bilateral lower extremity.   Plan:   Follow up in one week.  

## 2020-04-23 ENCOUNTER — Ambulatory Visit (INDEPENDENT_AMBULATORY_CARE_PROVIDER_SITE_OTHER): Payer: Medicare Other | Admitting: Nurse Practitioner

## 2020-04-23 ENCOUNTER — Encounter (INDEPENDENT_AMBULATORY_CARE_PROVIDER_SITE_OTHER): Payer: Self-pay

## 2020-04-23 ENCOUNTER — Other Ambulatory Visit: Payer: Self-pay

## 2020-04-23 VITALS — BP 121/82 | HR 105 | Resp 18 | Wt 279.0 lb

## 2020-04-23 DIAGNOSIS — L97201 Non-pressure chronic ulcer of unspecified calf limited to breakdown of skin: Secondary | ICD-10-CM

## 2020-04-23 NOTE — Progress Notes (Signed)
History of Present Illness  There is no documented history at this time  Assessments & Plan   There are no diagnoses linked to this encounter.    Additional instructions  Subjective:  Patient presents with venous ulcer of the Bilateral lower extremity.    Procedure:  3 layer unna wrap was placed Bilateral lower extremity.   Plan:   Follow up in one week.  

## 2020-04-26 ENCOUNTER — Encounter (INDEPENDENT_AMBULATORY_CARE_PROVIDER_SITE_OTHER): Payer: Self-pay

## 2020-04-26 ENCOUNTER — Telehealth: Payer: Self-pay

## 2020-04-26 ENCOUNTER — Ambulatory Visit (INDEPENDENT_AMBULATORY_CARE_PROVIDER_SITE_OTHER): Payer: Medicare Other | Admitting: Nurse Practitioner

## 2020-04-26 ENCOUNTER — Other Ambulatory Visit: Payer: Self-pay

## 2020-04-26 VITALS — BP 134/72 | HR 102 | Resp 18 | Wt 277.0 lb

## 2020-04-26 DIAGNOSIS — L97201 Non-pressure chronic ulcer of unspecified calf limited to breakdown of skin: Secondary | ICD-10-CM

## 2020-04-26 MED ORDER — AMOXICILLIN-POT CLAVULANATE 875-125 MG PO TABS: 1.0000 | ORAL_TABLET | Freq: Two times a day (BID) | ORAL | 0 refills | Status: DC

## 2020-04-26 MED ORDER — TRAMADOL HCL 50 MG PO TABS: 50.0000 mg | ORAL_TABLET | Freq: Four times a day (QID) | ORAL | 0 refills | Status: DC | PRN

## 2020-04-26 NOTE — Telephone Encounter (Signed)
Called patient to confirm doing ok. Patient stated that he is ok but has had a little more SOB than his usual. Nothing that he feels needs to be evaluated in the ER. He stated he does have an appt coming up this week with cardiology. Encouraged him to keep this appt and get evaluated over the weekend if he becomes worse. Patient agreed.

## 2020-04-26 NOTE — Progress Notes (Signed)
History of Present Illness  There is no documented history at this time  Assessments & Plan   There are no diagnoses linked to this encounter.    Additional instructions  Subjective:  Patient presents with venous ulcer of the Bilateral lower extremity.    Procedure:  3 layer unna wrap was placed Bilateral lower extremity.   Plan:   Follow up in one week.  

## 2020-04-29 ENCOUNTER — Encounter (INDEPENDENT_AMBULATORY_CARE_PROVIDER_SITE_OTHER): Payer: Self-pay | Admitting: Nurse Practitioner

## 2020-04-30 ENCOUNTER — Encounter (INDEPENDENT_AMBULATORY_CARE_PROVIDER_SITE_OTHER): Payer: Self-pay

## 2020-04-30 ENCOUNTER — Ambulatory Visit (INDEPENDENT_AMBULATORY_CARE_PROVIDER_SITE_OTHER): Payer: Medicare Other | Admitting: Nurse Practitioner

## 2020-04-30 ENCOUNTER — Other Ambulatory Visit: Payer: Self-pay

## 2020-04-30 VITALS — BP 100/60 | HR 120 | Resp 16 | Wt 275.8 lb

## 2020-04-30 DIAGNOSIS — L97201 Non-pressure chronic ulcer of unspecified calf limited to breakdown of skin: Secondary | ICD-10-CM

## 2020-04-30 MED ORDER — LEVOFLOXACIN 500 MG PO TABS: 500.0000 mg | ORAL_TABLET | Freq: Every day | ORAL | 0 refills | Status: DC

## 2020-04-30 NOTE — Progress Notes (Signed)
History of Present Illness  There is no documented history at this time  Assessments & Plan   There are no diagnoses linked to this encounter.    Additional instructions  Subjective:  Patient presents with venous ulcer of the Bilateral lower extremity.    Procedure:  3 layer unna wrap was placed Bilateral lower extremity.   Plan:   Follow up in one week.  

## 2020-05-02 DIAGNOSIS — R600 Localized edema: Secondary | ICD-10-CM | POA: Diagnosis not present

## 2020-05-02 DIAGNOSIS — E1159 Type 2 diabetes mellitus with other circulatory complications: Secondary | ICD-10-CM | POA: Diagnosis not present

## 2020-05-02 DIAGNOSIS — I493 Ventricular premature depolarization: Secondary | ICD-10-CM | POA: Diagnosis not present

## 2020-05-02 DIAGNOSIS — I152 Hypertension secondary to endocrine disorders: Secondary | ICD-10-CM | POA: Diagnosis not present

## 2020-05-02 DIAGNOSIS — E1069 Type 1 diabetes mellitus with other specified complication: Secondary | ICD-10-CM | POA: Diagnosis not present

## 2020-05-02 DIAGNOSIS — I2510 Atherosclerotic heart disease of native coronary artery without angina pectoris: Secondary | ICD-10-CM | POA: Diagnosis not present

## 2020-05-02 DIAGNOSIS — E785 Hyperlipidemia, unspecified: Secondary | ICD-10-CM | POA: Diagnosis not present

## 2020-05-02 DIAGNOSIS — I495 Sick sinus syndrome: Secondary | ICD-10-CM | POA: Diagnosis not present

## 2020-05-02 DIAGNOSIS — I5022 Chronic systolic (congestive) heart failure: Secondary | ICD-10-CM | POA: Diagnosis not present

## 2020-05-03 ENCOUNTER — Other Ambulatory Visit: Payer: Self-pay

## 2020-05-03 ENCOUNTER — Ambulatory Visit (INDEPENDENT_AMBULATORY_CARE_PROVIDER_SITE_OTHER): Payer: Medicare Other | Admitting: Nurse Practitioner

## 2020-05-03 ENCOUNTER — Encounter (INDEPENDENT_AMBULATORY_CARE_PROVIDER_SITE_OTHER): Payer: Self-pay

## 2020-05-03 VITALS — BP 103/67 | HR 111 | Resp 16 | Wt 274.0 lb

## 2020-05-03 DIAGNOSIS — L97201 Non-pressure chronic ulcer of unspecified calf limited to breakdown of skin: Secondary | ICD-10-CM | POA: Diagnosis not present

## 2020-05-03 NOTE — Progress Notes (Signed)
History of Present Illness  There is no documented history at this time  Assessments & Plan   There are no diagnoses linked to this encounter.    Additional instructions  Subjective:  Patient presents with venous ulcer of the Bilateral lower extremity.    Procedure:  3 layer unna wrap was placed Bilateral lower extremity.   Plan:   Follow up in one week.  

## 2020-05-07 ENCOUNTER — Other Ambulatory Visit: Payer: Self-pay

## 2020-05-07 ENCOUNTER — Ambulatory Visit (INDEPENDENT_AMBULATORY_CARE_PROVIDER_SITE_OTHER): Payer: Medicare Other | Admitting: Nurse Practitioner

## 2020-05-07 ENCOUNTER — Encounter (INDEPENDENT_AMBULATORY_CARE_PROVIDER_SITE_OTHER): Payer: Self-pay | Admitting: Nurse Practitioner

## 2020-05-07 VITALS — BP 130/76 | HR 120 | Resp 16 | Wt 274.6 lb

## 2020-05-07 DIAGNOSIS — L97201 Non-pressure chronic ulcer of unspecified calf limited to breakdown of skin: Secondary | ICD-10-CM | POA: Diagnosis not present

## 2020-05-07 NOTE — Progress Notes (Signed)
History of Present Illness  There is no documented history at this time  Assessments & Plan   There are no diagnoses linked to this encounter.    Additional instructions  Subjective:  Patient presents with venous ulcer of the Bilateral lower extremity.    Procedure:  3 layer unna wrap was placed Bilateral lower extremity.   Plan:   Follow up in one week.  

## 2020-05-08 ENCOUNTER — Encounter (INDEPENDENT_AMBULATORY_CARE_PROVIDER_SITE_OTHER): Payer: Self-pay | Admitting: Nurse Practitioner

## 2020-05-09 ENCOUNTER — Other Ambulatory Visit: Payer: Self-pay

## 2020-05-09 ENCOUNTER — Ambulatory Visit (INDEPENDENT_AMBULATORY_CARE_PROVIDER_SITE_OTHER): Payer: Medicare Other | Admitting: Podiatry

## 2020-05-09 ENCOUNTER — Encounter: Payer: Self-pay | Admitting: Podiatry

## 2020-05-09 DIAGNOSIS — M79674 Pain in right toe(s): Secondary | ICD-10-CM

## 2020-05-09 DIAGNOSIS — B351 Tinea unguium: Secondary | ICD-10-CM

## 2020-05-09 DIAGNOSIS — M79675 Pain in left toe(s): Secondary | ICD-10-CM | POA: Diagnosis not present

## 2020-05-09 DIAGNOSIS — E1151 Type 2 diabetes mellitus with diabetic peripheral angiopathy without gangrene: Secondary | ICD-10-CM | POA: Diagnosis not present

## 2020-05-09 NOTE — Progress Notes (Signed)
This patient returns to my office for at risk foot care.  This patient requires this care by a professional since this patient will be at risk due to having chronic kidney disease and diabetes.  This patient is unable to cut nails himself since the patient cannot reach his nails.These nails are painful walking and wearing shoes.  This patient presents for at risk foot care today.  General Appearance  Alert, conversant and in no acute stress.  Vascular  Deferred due to unna boots.  Neurologic  Deferred due to unna boots.  Nails Thick disfigured discolored nails with subungual debris  from hallux to fifth toes bilaterally. No evidence of bacterial infection or drainage bilaterally.  Orthopedic  No limitations of motion  feet .  No crepitus or effusions noted.  No bony pathology or digital deformities noted.  Skin  normotropic skin with no porokeratosis noted bilaterally.  No signs of infections or ulcers noted.     Onychomycosis  Pain in right toes  Pain in left toes  Consent was obtained for treatment procedures.   Mechanical debridement of nails 1-5  bilaterally performed with a nail nipper.  Filed with dremel without incident. His nails and feet are getting harder to care for since he continually wears unna boots.  There are digital ulcers forming with areas of crusts on the exposed areas of his toes. He explains he needs to continually wear the unna boots since he has chronic infections to his legs.   Return office visit    3 months                 Told patient to return for periodic foot care and evaluation due to potential at risk complications.   [name] DPM

## 2020-05-10 ENCOUNTER — Encounter (INDEPENDENT_AMBULATORY_CARE_PROVIDER_SITE_OTHER): Payer: Self-pay

## 2020-05-10 ENCOUNTER — Ambulatory Visit (INDEPENDENT_AMBULATORY_CARE_PROVIDER_SITE_OTHER): Payer: Medicare Other | Admitting: Nurse Practitioner

## 2020-05-10 VITALS — BP 138/53 | HR 93 | Resp 16 | Wt 272.8 lb

## 2020-05-10 DIAGNOSIS — L97201 Non-pressure chronic ulcer of unspecified calf limited to breakdown of skin: Secondary | ICD-10-CM

## 2020-05-10 NOTE — Progress Notes (Signed)
History of Present Illness  There is no documented history at this time  Assessments & Plan   There are no diagnoses linked to this encounter.    Additional instructions  Subjective:  Patient presents with venous ulcer of the Bilateral lower extremity.    Procedure:  3 layer unna wrap was placed Bilateral lower extremity.   Plan:   Follow up in one week.  

## 2020-05-14 ENCOUNTER — Other Ambulatory Visit: Payer: Self-pay

## 2020-05-14 ENCOUNTER — Encounter (INDEPENDENT_AMBULATORY_CARE_PROVIDER_SITE_OTHER): Payer: Self-pay | Admitting: Nurse Practitioner

## 2020-05-14 ENCOUNTER — Ambulatory Visit (INDEPENDENT_AMBULATORY_CARE_PROVIDER_SITE_OTHER): Payer: Medicare Other | Admitting: Nurse Practitioner

## 2020-05-14 VITALS — BP 120/67 | HR 92 | Resp 16 | Wt 272.8 lb

## 2020-05-14 DIAGNOSIS — L97201 Non-pressure chronic ulcer of unspecified calf limited to breakdown of skin: Secondary | ICD-10-CM

## 2020-05-14 DIAGNOSIS — I890 Lymphedema, not elsewhere classified: Secondary | ICD-10-CM

## 2020-05-14 NOTE — Progress Notes (Signed)
Subjective:    [name], male    DOB: Apr 16, 1946, 74 y.o.   MRN: [id number] Chief Complaint  Patient presents with  . Follow-up    unna check    Patient presents today for Unna wrap check.  The lower extremity ulcerations look markedly improved after finishing his Levaquin.  The patient's weeping has stopped.  Many of the ulcerations have dried up and scabbed over.  The discomfort that the patient was feeling has mostly stopped.  The patient continues to be compliant with his heart failure medications as well as his recent changes in diuretics.  He denies any worsening shortness of breath.       Review of Systems  Respiratory: Positive for shortness of breath (With exertion).   Cardiovascular: Positive for leg swelling.  Skin: Positive for wound.  All other systems reviewed and are negative.      Objective:   Physical Exam Vitals reviewed.  HENT:     Head: Normocephalic.  Cardiovascular:     Rate and Rhythm: Normal rate and regular rhythm.     Pulses: Decreased pulses.  Pulmonary:     Effort: Pulmonary effort is normal.  Abdominal:     General: Abdomen is flat.     Palpations: Abdomen is soft.  Musculoskeletal:     Cervical back: Normal range of motion.     Right lower leg: Edema present.     Left lower leg: Edema present.  Skin:    General: Skin is warm and dry.     Comments: Multiple drying ulcerations bilaterally  Neurological:     Mental Status: He is alert and oriented to person, place, and time.  Psychiatric:        Mood and Affect: Mood normal.        Behavior: Behavior normal.        Thought Content: Thought content normal.        Judgment: Judgment normal.     BP 120/67 (BP Location: Right Arm)   Pulse 92   Resp 16   Wt 272 lb 12.8 oz (123.7 kg)   BMI 39.14 kg/m   Past Medical History:  Diagnosis Date  . CHF (congestive heart failure) ([place])   . Depression   . 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   . Gastroparesis   . Graves disease   . Hypercholesterolemia   . Hypertension   . Mantle cell lymphoma ([place]) 07/2014  . Peripheral neuropathy     Social History   Socioeconomic History  . Marital status: Married    Spouse name: Not on file  . Number of children: 1  . Years of education: Not on file  . Highest education level: Not on file  Occupational History  . Not on file  Tobacco Use  . Smoking status: Former Research scientist (life sciences)  . Smokeless tobacco: Current User    Types: Chew  Vaping Use  . Vaping Use: Never used  Substance and Sexual Activity  . Alcohol use: Yes    Alcohol/week: 3.0 standard drinks    Types: 3 Cans of beer per week    Comment: nightly  . Drug use: Never  . Sexual activity: Not Currently  Other Topics Concern  . Not on file  Social History Narrative  . Not on file   Social Determinants of Health   Financial Resource Strain: Unknown  . Difficulty of Paying Living Expenses: Patient refused  Food Insecurity: Unknown  .  Worried About Charity fundraiser in the Last Year: Patient refused  . Ran Out of Food in the Last Year: Patient refused  Transportation Needs: No Transportation Needs  . Lack of Transportation (Medical): No  . Lack of Transportation (Non-Medical): No  Physical Activity: Inactive  . Days of Exercise per Week: 0 days  . Minutes of Exercise per Session: 0 min  Stress: No Stress Concern Present  . Feeling of Stress : Not at all  Social Connections: Unknown  . Frequency of Communication with Friends and Family: Patient refused  . Frequency of Social Gatherings with Friends and Family: Patient refused  . Attends Religious Services: Patient refused  . Active Member of Clubs or Organizations: Patient refused  . Attends Archivist Meetings: Patient refused  . Marital Status: Patient refused  Intimate Partner Violence: Unknown  . Fear of Current or Ex-Partner: Patient refused  . Emotionally Abused:  Patient refused  . Physically Abused: Patient refused  . Sexually Abused: Patient refused    Past Surgical History:  Procedure Laterality Date  . CHOLECYSTECTOMY    . NEPHRECTOMY     right  . PACEMAKER INSERTION N/A 12/07/2017   Procedure: INSERTION PACEMAKER DUAL CHAMBER INITIAL INSERT;  Surgeon: Isaias Cowman, MD;  Location: [facility] ORS;  Service: Cardiovascular;  Laterality: N/A;  . PORTA CATH INSERTION N/A 11/03/2017   Procedure: PORTA CATH INSERTION;  Surgeon: Katha Cabal, MD;  Location: [facility];  Service: Cardiovascular;  Laterality: N/A;    Family History  Problem Relation Age of Onset  . Breast cancer Mother   . Diabetes Father   . Cancer Father        Lymphoma  . Alcohol abuse Maternal Uncle   . Diabetes Sister   . Heart disease Other        grandfather    Allergies  Allergen Reactions  . Rofecoxib Nausea Only       Assessment & Plan:   1. Lymphedema The patient will continue on bilateral Unna wraps as he still has significant edema as well as healing ulcerations.  The patient is also advised to continue with elevation and exercise as possible.  We will move the patient's follow-up visit from a biweekly Unna wrap to a weekly Unna wrap due to the fact that the patient's wounds have stopped oozing at this time.  Patient will present to the office weekly for Unna wraps and then we will see the patient in 4 weeks for follow-up.  2. Skin ulcer of calf, limited to breakdown of skin, unspecified laterality ([place]) The patient's ulcerations have mostly dried and scabbed over at this point.  The patient's lower extremity wounds look greatly improved from his last follow-up visit.  The patient also notes that his pain is greatly decreased.  Patient had good response to previous antibiotic therapy.   Current Outpatient Medications on File Prior to Visit  Medication Sig Dispense Refill  . ACCU-CHEK AVIVA PLUS test strip USE AS INSTRUCTED. USE 1 STRIP  EVERY 3 HOURS . ACCU-CHECK AVIVA PLUS TEST STRIP. DX E10.649  12  . acetaminophen (TYLENOL) 500 MG tablet Take 500 mg by mouth every 6 (six) hours as needed.     . cyanocobalamin (,VITAMIN B-12,) 1000 MCG/ML injection Inject into the muscle.    . ferrous sulfate 325 (65 FE) MG tablet Take 325 mg by mouth daily.    . furosemide (LASIX) 20 MG tablet Take 40 mg by mouth 2 (two) times daily. 2  tablets in the morning 1 tablet in the afternoon as needed    . GLUCAGON EMERGENCY 1 MG injection     . glucagon, human recombinant, (GLUCAGEN DIAGNOSTIC) 1 MG injection Use as directed    . glucose 4 GM chewable tablet Chew 1 tablet by mouth once as needed for low blood sugar.    Marland Kitchen glucose blood (PRECISION QID TEST) test strip Use 8 (eight) times daily as directed. BAYER CONTOUR NEXT TEST STRIPS E10.649    . insulin aspart (NOVOLOG) 100 UNIT/ML injection Basal rate 12am 0.8 units per hour, 9am 0.9 units per hour. (total basal insulin 20.7 units). Carbohydrate ratio 1 units for every 8gm of carbohydrate. Correction factor 1 units for every 50mg /dl over target cbg. Target CBG 80-120 1 vial 12  . levothyroxine (SYNTHROID) 25 MCG tablet Take by mouth.    Marland Kitchen lisinopril (ZESTRIL) 20 MG tablet Take 20 mg by mouth daily.    . Multiple Vitamin (MULTIVITAMIN) tablet Take 1 tablet by mouth 2 (two) times daily.     Marland Kitchen nystatin cream (MYCOSTATIN) APPLY TO AFFECTED AREA TWICE A DAY 30 g 0  . pravastatin (PRAVACHOL) 40 MG tablet Take 40 mg by mouth daily.     . prochlorperazine (COMPAZINE) 10 MG tablet TAKE 1 TABLET (10 MG TOTAL) BY MOUTH EVERY 6 (SIX) HOURS AS NEEDED (NAUSEA OR VOMITING).    Marland Kitchen spironolactone (ALDACTONE) 25 MG tablet Take 25 mg by mouth daily.    . vitamin B-12 (CYANOCOBALAMIN) 250 MCG tablet Take 250 mcg by mouth daily.    . Vitamin D, Cholecalciferol, 10 MCG (400 UNIT) TABS Take by mouth.    Marland Kitchen amoxicillin-clavulanate (AUGMENTIN) 875-125 MG tablet Take 1 tablet by mouth 2 (two) times daily. (Patient not  taking: Reported on 05/14/2020) 24 tablet 0  . carvedilol (COREG) 3.125 MG tablet Take 3.125 mg by mouth 2 (two) times daily with a meal.     . levofloxacin (LEVAQUIN) 500 MG tablet Take 1 tablet (500 mg total) by mouth daily. (Patient not taking: Reported on 05/14/2020) 10 tablet 0  . traMADol (ULTRAM) 50 MG tablet Take 1 tablet (50 mg total) by mouth every 6 (six) hours as needed. (Patient not taking: Reported on 05/14/2020) 30 tablet 0   Current Facility-Administered Medications on File Prior to Visit  Medication Dose Route Frequency Provider Last Rate Last Admin  . heparin lock flush 100 unit/mL  500 Units Intravenous Once Lloyd Huger, MD      . sodium chloride flush (NS) 0.9 % injection 10 mL  10 mL Intravenous PRN Lloyd Huger, MD   10 mL at 02/01/18 0829  . sodium chloride flush (NS) 0.9 % injection 10 mL  10 mL Intravenous PRN Lloyd Huger, MD   10 mL at 04/05/18 0800    There are no Patient Instructions on file for this visit. No follow-ups on file.   Kris Hartmann, NP

## 2020-05-20 ENCOUNTER — Other Ambulatory Visit: Payer: Self-pay

## 2020-05-20 ENCOUNTER — Inpatient Hospital Stay: Payer: Medicare Other | Attending: Oncology

## 2020-05-20 ENCOUNTER — Other Ambulatory Visit: Payer: Self-pay | Admitting: Internal Medicine

## 2020-05-20 DIAGNOSIS — C8310 Mantle cell lymphoma, unspecified site: Secondary | ICD-10-CM | POA: Insufficient documentation

## 2020-05-20 DIAGNOSIS — Z452 Encounter for adjustment and management of vascular access device: Secondary | ICD-10-CM | POA: Diagnosis not present

## 2020-05-20 DIAGNOSIS — Z95828 Presence of other vascular implants and grafts: Secondary | ICD-10-CM

## 2020-05-20 MED ORDER — SODIUM CHLORIDE 0.9% FLUSH
10.0000 mL | INTRAVENOUS | Status: DC | PRN
  Administered 2020-05-20: 10 mL via INTRAVENOUS
  Filled 2020-05-20: qty 10

## 2020-05-20 MED ORDER — HEPARIN SOD (PORK) LOCK FLUSH 100 UNIT/ML IV SOLN
500.0000 [IU] | Freq: Once | INTRAVENOUS | Status: AC
  Administered 2020-05-20: 500 [IU] via INTRAVENOUS
  Filled 2020-05-20: qty 5

## 2020-05-20 MED ORDER — HEPARIN SOD (PORK) LOCK FLUSH 100 UNIT/ML IV SOLN
INTRAVENOUS | Status: AC
  Filled 2020-05-20: qty 5

## 2020-05-21 ENCOUNTER — Encounter (INDEPENDENT_AMBULATORY_CARE_PROVIDER_SITE_OTHER): Payer: Self-pay

## 2020-05-21 ENCOUNTER — Ambulatory Visit (INDEPENDENT_AMBULATORY_CARE_PROVIDER_SITE_OTHER): Payer: Medicare Other | Admitting: Nurse Practitioner

## 2020-05-21 VITALS — BP 118/70 | HR 97 | Resp 16 | Wt 273.0 lb

## 2020-05-21 DIAGNOSIS — L97201 Non-pressure chronic ulcer of unspecified calf limited to breakdown of skin: Secondary | ICD-10-CM

## 2020-05-21 NOTE — Progress Notes (Signed)
History of Present Illness  There is no documented history at this time  Assessments & Plan   There are no diagnoses linked to this encounter.    Additional instructions  Subjective:  Patient presents with venous ulcer of the Bilateral lower extremity.    Procedure:  3 layer unna wrap was placed Bilateral lower extremity.   Plan:   Follow up in one week.  

## 2020-05-28 ENCOUNTER — Ambulatory Visit: Payer: Medicare Other | Admitting: Family

## 2020-05-28 ENCOUNTER — Ambulatory Visit (INDEPENDENT_AMBULATORY_CARE_PROVIDER_SITE_OTHER): Payer: Medicare Other | Admitting: Nurse Practitioner

## 2020-05-28 ENCOUNTER — Encounter (INDEPENDENT_AMBULATORY_CARE_PROVIDER_SITE_OTHER): Payer: Self-pay

## 2020-05-28 ENCOUNTER — Other Ambulatory Visit: Payer: Self-pay

## 2020-05-28 VITALS — BP 125/61 | HR 88 | Resp 16 | Wt 274.8 lb

## 2020-05-28 DIAGNOSIS — L97201 Non-pressure chronic ulcer of unspecified calf limited to breakdown of skin: Secondary | ICD-10-CM

## 2020-05-28 NOTE — Progress Notes (Signed)
History of Present Illness  There is no documented history at this time  Assessments & Plan   There are no diagnoses linked to this encounter.    Additional instructions  Subjective:  Patient presents with venous ulcer of the Bilateral lower extremity.    Procedure:  3 layer unna wrap was placed Bilateral lower extremity.   Plan:   Follow up in one week.  

## 2020-05-30 ENCOUNTER — Ambulatory Visit: Payer: Medicare Other | Attending: Family | Admitting: Family

## 2020-05-30 ENCOUNTER — Encounter: Payer: Self-pay | Admitting: Family

## 2020-05-30 ENCOUNTER — Other Ambulatory Visit: Payer: Self-pay

## 2020-05-30 VITALS — BP 133/64 | HR 82 | Resp 18 | Ht 70.0 in | Wt 271.5 lb

## 2020-05-30 DIAGNOSIS — Z9049 Acquired absence of other specified parts of digestive tract: Secondary | ICD-10-CM | POA: Diagnosis not present

## 2020-05-30 DIAGNOSIS — I890 Lymphedema, not elsewhere classified: Secondary | ICD-10-CM | POA: Insufficient documentation

## 2020-05-30 DIAGNOSIS — Z950 Presence of cardiac pacemaker: Secondary | ICD-10-CM | POA: Diagnosis not present

## 2020-05-30 DIAGNOSIS — Z905 Acquired absence of kidney: Secondary | ICD-10-CM | POA: Diagnosis not present

## 2020-05-30 DIAGNOSIS — E785 Hyperlipidemia, unspecified: Secondary | ICD-10-CM | POA: Insufficient documentation

## 2020-05-30 DIAGNOSIS — E11319 Type 2 diabetes mellitus with unspecified diabetic retinopathy without macular edema: Secondary | ICD-10-CM | POA: Insufficient documentation

## 2020-05-30 DIAGNOSIS — E1022 Type 1 diabetes mellitus with diabetic chronic kidney disease: Secondary | ICD-10-CM

## 2020-05-30 DIAGNOSIS — K3184 Gastroparesis: Secondary | ICD-10-CM | POA: Diagnosis not present

## 2020-05-30 DIAGNOSIS — Z8572 Personal history of non-Hodgkin lymphomas: Secondary | ICD-10-CM | POA: Diagnosis not present

## 2020-05-30 DIAGNOSIS — I10 Essential (primary) hypertension: Secondary | ICD-10-CM

## 2020-05-30 DIAGNOSIS — I5032 Chronic diastolic (congestive) heart failure: Secondary | ICD-10-CM | POA: Insufficient documentation

## 2020-05-30 DIAGNOSIS — F17220 Nicotine dependence, chewing tobacco, uncomplicated: Secondary | ICD-10-CM | POA: Insufficient documentation

## 2020-05-30 DIAGNOSIS — Z79899 Other long term (current) drug therapy: Secondary | ICD-10-CM | POA: Insufficient documentation

## 2020-05-30 DIAGNOSIS — Z794 Long term (current) use of insulin: Secondary | ICD-10-CM | POA: Insufficient documentation

## 2020-05-30 DIAGNOSIS — Z8249 Family history of ischemic heart disease and other diseases of the circulatory system: Secondary | ICD-10-CM | POA: Diagnosis not present

## 2020-05-30 DIAGNOSIS — F329 Major depressive disorder, single episode, unspecified: Secondary | ICD-10-CM | POA: Diagnosis not present

## 2020-05-30 DIAGNOSIS — E079 Disorder of thyroid, unspecified: Secondary | ICD-10-CM | POA: Diagnosis not present

## 2020-05-30 DIAGNOSIS — I110 Hypertensive heart disease with heart failure: Secondary | ICD-10-CM | POA: Insufficient documentation

## 2020-05-30 DIAGNOSIS — E1142 Type 2 diabetes mellitus with diabetic polyneuropathy: Secondary | ICD-10-CM | POA: Diagnosis not present

## 2020-05-30 DIAGNOSIS — E7800 Pure hypercholesterolemia, unspecified: Secondary | ICD-10-CM | POA: Insufficient documentation

## 2020-05-30 NOTE — Progress Notes (Signed)
[name], male    DOB: 1946-09-11, 74 y.o.   MRN: [id number]  HPI  Gary [name] is a 74 y/o male with a history of DM, hyperlipidemia, HTN, thyroid disease, depression, current chewing tobacco use and chronic heart failure.   Echo report from 08/20/2019 reviewed and showed an EF of 50-55% along with mild Gary/TR.   Has not been admitted or been in the ED in the last 6 months.   He presents today for a follow-up visit with a chief complaint of moderate shortness of breath upon minimal exertion. He describes this as chronic in nature having been present for several years. He does feel like it's a little worse with the hot/ humid weather. He has associated fatigue, pedal edema, palpitations, light-headedness, easy bruising and chronic difficulty sleeping along with this. He denies any abdominal distention, chest pain, cough or weight gain.   Continues to have lower legs wrapped in UNNA boots and they get changed at the [place].   Past Medical History:  Diagnosis Date  . CHF (congestive heart failure) ([place])   . Depression   . 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 Gastroparesis   . Graves disease   . Hypercholesterolemia   . Hypertension   . Mantle cell lymphoma ([place]) 07/2014  . Peripheral neuropathy    Past Surgical History:  Procedure Laterality Date  . CHOLECYSTECTOMY    . NEPHRECTOMY     right  . PACEMAKER INSERTION N/A 12/07/2017   Procedure: INSERTION PACEMAKER DUAL CHAMBER INITIAL INSERT;  Surgeon: Isaias Cowman, MD;  Location: [facility];  Service: Cardiovascular;  Laterality: N/A;  . PORTA CATH INSERTION N/A 11/03/2017   Procedure: PORTA CATH INSERTION;  Surgeon: Katha Cabal, MD;  Location: [facility];  Service: Cardiovascular;  Laterality: N/A;   Family History  Problem Relation Age of Onset  . Breast cancer Mother   . Diabetes Father   . Cancer Father        Lymphoma  . Alcohol  abuse Maternal Uncle   . Diabetes Sister   . Heart disease Other        grandfather   Social History   Tobacco Use  . Smoking status: Former Research scientist (life sciences)  . Smokeless tobacco: Current User    Types: Chew  Substance Use Topics  . Alcohol use: Yes    Alcohol/week: 3.0 standard drinks    Types: 3 Cans of beer per week    Comment: nightly   Allergies  Allergen Reactions  . Rofecoxib Nausea Only   Prior to Admission medications   Medication Sig Start Date End Date Taking? Authorizing Provider  ACCU-CHEK AVIVA PLUS test strip USE AS INSTRUCTED. USE 1 STRIP EVERY 3 HOURS . ACCU-CHECK AVIVA PLUS TEST STRIP. DX S28.315 01/06/17  Yes [provider]  acetaminophen (TYLENOL) 500 MG tablet Take 500 mg by mouth every 6 (six) hours as needed.    Yes [provider]  carvedilol (COREG) 3.125 MG tablet Take 3.125 mg by mouth 2 (two) times daily with a meal.  10/07/17 05/30/20 Yes [provider]  cyanocobalamin (,VITAMIN B-12,) 1000 MCG/ML injection Inject into the muscle.   Yes [provider]  ferrous sulfate 325 (65 FE) MG tablet Take 325 mg by mouth daily.   Yes [provider]  furosemide (LASIX) 20 MG tablet Take 40 mg by mouth 2 (two) times daily. 2 tablets in the morning 1 tablet  in the afternoon as needed   Yes [provider]  GLUCAGON EMERGENCY 1 MG injection  11/30/17  Yes [provider]  glucagon, human recombinant, (GLUCAGEN DIAGNOSTIC) 1 MG injection Use as directed 11/30/17  Yes [provider]  glucose 4 GM chewable tablet Chew 1 tablet by mouth once as needed for low blood sugar.   Yes [provider]  glucose blood (PRECISION QID TEST) test strip Use 8 (eight) times daily as directed. BAYER CONTOUR NEXT TEST STRIPS E10.649 03/03/18  Yes [provider]  insulin aspart (NOVOLOG) 100 UNIT/ML injection Basal rate 12am 0.8 units per hour, 9am 0.9 units per hour. (total basal insulin 20.7 units).  Carbohydrate ratio 1 units for every 8gm of carbohydrate. Correction factor 1 units for every 50mg /dl over target cbg. Target CBG 80-120 10/29/17  Yes Wieting, Richard, MD  nystatin cream (MYCOSTATIN) APPLY TO AFFECTED AREA TWICE A DAY 05/21/20  Yes [name], MD  pravastatin (PRAVACHOL) 40 MG tablet Take 40 mg by mouth daily.  03/29/19  Yes [provider]  prochlorperazine (COMPAZINE) 10 MG tablet TAKE 1 TABLET (10 MG TOTAL) BY MOUTH EVERY 6 (SIX) HOURS AS NEEDED (NAUSEA OR VOMITING). 10/24/17  Yes [provider]  vitamin B-12 (CYANOCOBALAMIN) 250 MCG tablet Take 250 mcg by mouth daily.   Yes [provider]  Vitamin D, Cholecalciferol, 10 MCG (400 UNIT) TABS Take by mouth. 03/20/20  Yes [provider]     Review of Systems  Constitutional: Positive for fatigue. Negative for appetite change.  HENT: Negative for congestion, postnasal drip and sore throat.   Eyes: Negative.   Respiratory: Positive for shortness of breath (easily). Negative for cough.   Cardiovascular: Positive for palpitations (at times) and leg swelling. Negative for chest pain.  Gastrointestinal: Negative for abdominal distention and abdominal pain.  Endocrine: Negative.   Genitourinary: Negative.   Musculoskeletal: Positive for neck pain. Negative for back pain.  Skin: Negative.   Allergic/Immunologic: Negative.   Neurological: Positive for light-headedness (if stand too quickly). Negative for dizziness.  Hematological: Negative for adenopathy. Bruises/bleeds easily.  Psychiatric/Behavioral: Positive for sleep disturbance (at times; sleeping in recliner). Negative for dysphoric mood. The patient is not nervous/anxious.    Vitals:   05/30/20 1403  BP: 133/64  Pulse: 82  Resp: 18  SpO2: 97%  Weight: 271 lb 8 oz (123.2 kg)  Height: 5\' 10"  (1.778 m)   Wt Readings from Last 3 Encounters:  05/30/20 271 lb 8 oz (123.2 kg)  05/28/20 274 lb 12.8 oz (124.6 kg)  05/21/20 273 lb (123.8  kg)   Lab Results  Component Value Date   CREATININE 1.88 (H) 01/24/2020   CREATININE 1.70 (H) 10/23/2019   CREATININE 1.56 (H) 08/29/2019    Physical Exam Vitals and nursing note reviewed.  Constitutional:      Appearance: He is well-developed.  HENT:     Head: Normocephalic and atraumatic.  Neck:     Vascular: No JVD.  Cardiovascular:     Rate and Rhythm: Normal rate and regular rhythm.  Pulmonary:     Effort: Pulmonary effort is normal. No respiratory distress.     Breath sounds: No rhonchi or rales.  Abdominal:     Palpations: Abdomen is soft.     Tenderness: There is no abdominal tenderness.  Musculoskeletal:     Cervical back: Normal range of motion.     Right lower leg: Edema present.     Left lower leg: No tenderness. Edema present.  Comments: Lower legs wrapped in UNNA boots  Skin:    General: Skin is warm and dry.     Findings: Ecchymosis (bilateral arms) present.  Neurological:     General: No focal deficit present.     Mental Status: He is alert and oriented to person, place, and time.  Psychiatric:        Mood and Affect: Mood normal.        Behavior: Behavior normal.    Assessment & Plan:  1: Chronic heart failure with preserved ejection fraction without structural changes- - NYHA class III - euvolemic - not weighing daily but does have scales; encouraged to resume weighing and to call for an overnight weight gain of >2 pounds or a weekly weight gain of >5 pounds - weight down 17 pounds from last visit here 6 months ago - says that he does add "little salt" to foods but nothing like he used to; says that he'll occasionally use a pinch of salt and the importance of not adding any salt to his food was discussed - saw cardiology Nehemiah Massed) 05/02/20 - BNP 12/02/17 was 469.0 - currently not interested in getting COVID vaccine  2: HTN- - BP looks good today - had video visit with PCP Nicki Reaper) 02/22/20 - BMP 02/21/20 reviewed and showed sodium 140,  potassium 3.9, creatinine 1.63 and GFR 48  3: DM- - A1c 02/28/20 was 6.7% - saw endocrinology Honor Junes) 02/28/20 - glucose at home was 250 - saw nephrology Candiss Norse) 02/21/20  4: Lymphedema- - legs are wrapped in UNNA boots - getting changed weekly by vascular - saw vascular Owens Shark) 05/28/20    Patient did not bring his medications nor a list. Each medication was verbally reviewed with the patient and he was encouraged to bring the bottles to every visit to confirm accuracy of list.  Due to HF stability, will not make another appointment for patient at this time. Advised patient that he could call back at anytime to schedule another appointment and he was comfortable with this plan.

## 2020-05-30 NOTE — Patient Instructions (Addendum)
Continue weighing daily and call for an overnight weight gain of > 2 pounds or a weekly weight gain of >5 pounds.   Call us in the future if you'd like to schedule another appointment 

## 2020-06-04 ENCOUNTER — Other Ambulatory Visit: Payer: Self-pay

## 2020-06-04 ENCOUNTER — Ambulatory Visit (INDEPENDENT_AMBULATORY_CARE_PROVIDER_SITE_OTHER): Payer: Medicare Other | Admitting: Nurse Practitioner

## 2020-06-04 ENCOUNTER — Encounter (INDEPENDENT_AMBULATORY_CARE_PROVIDER_SITE_OTHER): Payer: Self-pay | Admitting: Nurse Practitioner

## 2020-06-04 VITALS — BP 121/71 | HR 96 | Resp 16 | Wt 275.0 lb

## 2020-06-04 DIAGNOSIS — I10 Essential (primary) hypertension: Secondary | ICD-10-CM | POA: Diagnosis not present

## 2020-06-04 DIAGNOSIS — I495 Sick sinus syndrome: Secondary | ICD-10-CM | POA: Diagnosis not present

## 2020-06-04 DIAGNOSIS — L97201 Non-pressure chronic ulcer of unspecified calf limited to breakdown of skin: Secondary | ICD-10-CM

## 2020-06-05 ENCOUNTER — Encounter (INDEPENDENT_AMBULATORY_CARE_PROVIDER_SITE_OTHER): Payer: Self-pay | Admitting: Nurse Practitioner

## 2020-06-05 NOTE — Progress Notes (Signed)
Subjective:    [name], male    DOB: Mar 13, 1946, 74 y.o.   MRN: [id number] Chief Complaint  Patient presents with  . Follow-up    unna check    The patient is following up today for evaluation of his lower extremity edema and ulcerations.  The patient's right lower extremity appears to be improved with previous ulcerations mostly healing.  However the left lower extremity has some new wet areas of drainage.  The patient is unsure if he has been weeping or feels just been due to the hot weather.  He does not endorse significant pain such as he has before with cellulitis.  He denies any fever, chills, nausea, vomiting or diarrhea.  He denies any extensive change in his fluid status or worsening shortness of breath.   Review of Systems  Respiratory: Positive for shortness of breath (with exertion ).   Cardiovascular: Positive for leg swelling.  Musculoskeletal: Positive for gait problem.  Skin: Positive for wound.  All other systems reviewed and are negative.      Objective:   Physical Exam Vitals reviewed.  HENT:     Head: Normocephalic.  Cardiovascular:     Rate and Rhythm: Normal rate and regular rhythm.     Pulses: Normal pulses.  Pulmonary:     Effort: Pulmonary effort is normal.  Skin:    General: Skin is warm and dry.     Comments: Ulcer on LLE   Neurological:     Mental Status: He is alert.     Motor: Weakness present.     Gait: Gait abnormal.  Psychiatric:        Mood and Affect: Mood normal.        Behavior: Behavior normal.        Thought Content: Thought content normal.        Judgment: Judgment normal.     BP 121/71 (BP Location: Right Arm)   Pulse 96   Resp 16   Wt 275 lb (124.7 kg)   BMI 39.46 kg/m   Past Medical History:  Diagnosis Date  . CHF (congestive heart failure) ([place])   . Depression   . 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 Gastroparesis   . Graves disease   .  Hypercholesterolemia   . Hypertension   . Mantle cell lymphoma ([place]) 07/2014  . Peripheral neuropathy     Social History   Socioeconomic History  . Marital status: Married    Spouse name: Not on file  . Number of children: 1  . Years of education: Not on file  . Highest education level: Not on file  Occupational History  . Not on file  Tobacco Use  . Smoking status: Former Research scientist (life sciences)  . Smokeless tobacco: Current User    Types: Chew  Vaping Use  . Vaping Use: Never used  Substance and Sexual Activity  . Alcohol use: Yes    Alcohol/week: 3.0 standard drinks    Types: 3 Cans of beer per week    Comment: nightly  . Drug use: Never  . Sexual activity: Not Currently  Other Topics Concern  . Not on file  Social History Narrative  . Not on file   Social Determinants of Health   Financial Resource Strain: Unknown  . Difficulty of Paying Living Expenses: Patient refused  Food Insecurity: Unknown  . Worried About Charity fundraiser in the Last Year: Patient refused  .  Ran Out of Food in the Last Year: Patient refused  Transportation Needs: No Transportation Needs  . Lack of Transportation (Medical): No  . Lack of Transportation (Non-Medical): No  Physical Activity: Inactive  . Days of Exercise per Week: 0 days  . Minutes of Exercise per Session: 0 min  Stress: No Stress Concern Present  . Feeling of Stress : Not at all  Social Connections: Unknown  . Frequency of Communication with Friends and Family: Patient refused  . Frequency of Social Gatherings with Friends and Family: Patient refused  . Attends Religious Services: Patient refused  . Active Member of Clubs or Organizations: Patient refused  . Attends Archivist Meetings: Patient refused  . Marital Status: Patient refused  Intimate Partner Violence: Unknown  . Fear of Current or Ex-Partner: Patient refused  . Emotionally Abused: Patient refused  . Physically Abused: Patient refused  . Sexually Abused:  Patient refused    Past Surgical History:  Procedure Laterality Date  . CHOLECYSTECTOMY    . NEPHRECTOMY     right  . PACEMAKER INSERTION N/A 12/07/2017   Procedure: INSERTION PACEMAKER DUAL CHAMBER INITIAL INSERT;  Surgeon: Isaias Cowman, MD;  Location: [facility];  Service: Cardiovascular;  Laterality: N/A;  . PORTA CATH INSERTION N/A 11/03/2017   Procedure: PORTA CATH INSERTION;  Surgeon: Katha Cabal, MD;  Location: [facility];  Service: Cardiovascular;  Laterality: N/A;    Family History  Problem Relation Age of Onset  . Breast cancer Mother   . Diabetes Father   . Cancer Father        Lymphoma  . Alcohol abuse Maternal Uncle   . Diabetes Sister   . Heart disease Other        grandfather    Allergies  Allergen Reactions  . Rofecoxib Nausea Only       Assessment & Plan:   1. Skin ulcer of calf, limited to breakdown of skin, unspecified laterality [facility]) The patient will remain in bilateral Unna wraps.  The patient will continue with weekly dressing changes and evaluation in 4 weeks.  We will also maintain monitoring of the area that seems to be more open than previous.  Patient is advised to contact us if there are changes prior to his wrap changes.  2. Essential hypertension, benign Continue antihypertensive medications as already ordered, these medications have been reviewed and there are no changes at this time.    Current Outpatient Medications on File Prior to Visit  Medication Sig Dispense Refill  . ACCU-CHEK AVIVA PLUS test strip USE AS INSTRUCTED. USE 1 STRIP EVERY 3 HOURS . ACCU-CHECK AVIVA PLUS TEST STRIP. DX E10.649  12  . acetaminophen (TYLENOL) 500 MG tablet Take 500 mg by mouth every 6 (six) hours as needed.     . cyanocobalamin (,VITAMIN B-12,) 1000 MCG/ML injection Inject into the muscle.    . ferrous sulfate 325 (65 FE) MG tablet Take 325 mg by mouth daily.    . furosemide (LASIX) 20 MG tablet Take 40 mg by mouth 2 (two) times  daily. 2 tablets in the morning 1 tablet in the afternoon as needed    . GLUCAGON EMERGENCY 1 MG injection     . glucagon, human recombinant, (GLUCAGEN DIAGNOSTIC) 1 MG injection Use as directed    . glucose 4 GM chewable tablet Chew 1 tablet by mouth once as needed for low blood sugar.    [place] Kitchen glucose blood (PRECISION QID TEST) test strip Use 8 (eight)  times daily as directed. BAYER CONTOUR NEXT TEST STRIPS E10.649    . insulin aspart (NOVOLOG) 100 UNIT/ML injection Basal rate 12am 0.8 units per hour, 9am 0.9 units per hour. (total basal insulin 20.7 units). Carbohydrate ratio 1 units for every 8gm of carbohydrate. Correction factor 1 units for every 50mg /dl over target cbg. Target CBG 80-120 1 vial 12  . nystatin cream (MYCOSTATIN) APPLY TO AFFECTED AREA TWICE A DAY 30 g 0  . pravastatin (PRAVACHOL) 40 MG tablet Take 40 mg by mouth daily.     . prochlorperazine (COMPAZINE) 10 MG tablet TAKE 1 TABLET (10 MG TOTAL) BY MOUTH EVERY 6 (SIX) HOURS AS NEEDED (NAUSEA OR VOMITING).    . vitamin B-12 (CYANOCOBALAMIN) 250 MCG tablet Take 250 mcg by mouth daily.    . Vitamin D, Cholecalciferol, 10 MCG (400 UNIT) TABS Take by mouth.    . carvedilol (COREG) 3.125 MG tablet Take 3.125 mg by mouth 2 (two) times daily with a meal.      Current Facility-Administered Medications on File Prior to Visit  Medication Dose Route Frequency Provider Last Rate Last Admin  . heparin lock flush 100 unit/mL  500 Units Intravenous Once Lloyd Huger, MD      . sodium chloride flush (NS) 0.9 % injection 10 mL  10 mL Intravenous PRN Lloyd Huger, MD   10 mL at 02/01/18 0829  . sodium chloride flush (NS) 0.9 % injection 10 mL  10 mL Intravenous PRN Lloyd Huger, MD   10 mL at 04/05/18 0800    There are no Patient Instructions on file for this visit. No follow-ups on file.   Kris Hartmann, NP

## 2020-06-12 ENCOUNTER — Other Ambulatory Visit: Payer: Self-pay

## 2020-06-12 ENCOUNTER — Encounter (INDEPENDENT_AMBULATORY_CARE_PROVIDER_SITE_OTHER): Payer: Self-pay

## 2020-06-12 ENCOUNTER — Ambulatory Visit (INDEPENDENT_AMBULATORY_CARE_PROVIDER_SITE_OTHER): Payer: Medicare Other | Admitting: Nurse Practitioner

## 2020-06-12 VITALS — BP 111/69 | HR 118 | Resp 18 | Wt 277.4 lb

## 2020-06-12 DIAGNOSIS — L97201 Non-pressure chronic ulcer of unspecified calf limited to breakdown of skin: Secondary | ICD-10-CM

## 2020-06-12 NOTE — Progress Notes (Signed)
History of Present Illness  There is no documented history at this time  Assessments & Plan   There are no diagnoses linked to this encounter.    Additional instructions  Subjective:  Patient presents with venous ulcer of the Bilateral lower extremity.    Procedure:  3 layer unna wrap was placed Bilateral lower extremity.   Plan:   Follow up in one week.  

## 2020-06-19 ENCOUNTER — Other Ambulatory Visit: Payer: Self-pay

## 2020-06-19 ENCOUNTER — Ambulatory Visit (INDEPENDENT_AMBULATORY_CARE_PROVIDER_SITE_OTHER): Payer: Medicare Other | Admitting: Nurse Practitioner

## 2020-06-19 VITALS — BP 121/63 | HR 87 | Resp 16 | Ht 70.0 in | Wt 279.0 lb

## 2020-06-19 DIAGNOSIS — L97201 Non-pressure chronic ulcer of unspecified calf limited to breakdown of skin: Secondary | ICD-10-CM

## 2020-06-19 NOTE — Progress Notes (Signed)
History of Present Illness  There is no documented history at this time  Assessments & Plan   There are no diagnoses linked to this encounter.    Additional instructions  Subjective:  Patient presents with venous ulcer of the Bilateral lower extremity.    Procedure:  3 layer unna wrap was placed Bilateral lower extremity.   Plan:   Follow up in one week.  

## 2020-06-20 ENCOUNTER — Encounter (INDEPENDENT_AMBULATORY_CARE_PROVIDER_SITE_OTHER): Payer: Self-pay | Admitting: Nurse Practitioner

## 2020-06-24 ENCOUNTER — Encounter: Payer: Self-pay | Admitting: Internal Medicine

## 2020-06-24 ENCOUNTER — Other Ambulatory Visit: Payer: Self-pay

## 2020-06-24 ENCOUNTER — Ambulatory Visit (INDEPENDENT_AMBULATORY_CARE_PROVIDER_SITE_OTHER): Payer: Medicare Other | Admitting: Internal Medicine

## 2020-06-24 VITALS — BP 130/62 | HR 108 | Temp 98.9°F | Ht 70.0 in | Wt 281.2 lb

## 2020-06-24 DIAGNOSIS — I429 Cardiomyopathy, unspecified: Secondary | ICD-10-CM

## 2020-06-24 DIAGNOSIS — I2510 Atherosclerotic heart disease of native coronary artery without angina pectoris: Secondary | ICD-10-CM

## 2020-06-24 DIAGNOSIS — I10 Essential (primary) hypertension: Secondary | ICD-10-CM

## 2020-06-24 DIAGNOSIS — I890 Lymphedema, not elsewhere classified: Secondary | ICD-10-CM | POA: Diagnosis not present

## 2020-06-24 DIAGNOSIS — C8318 Mantle cell lymphoma, lymph nodes of multiple sites: Secondary | ICD-10-CM

## 2020-06-24 DIAGNOSIS — D649 Anemia, unspecified: Secondary | ICD-10-CM

## 2020-06-24 DIAGNOSIS — E7800 Pure hypercholesterolemia, unspecified: Secondary | ICD-10-CM | POA: Diagnosis not present

## 2020-06-24 DIAGNOSIS — E079 Disorder of thyroid, unspecified: Secondary | ICD-10-CM | POA: Diagnosis not present

## 2020-06-24 DIAGNOSIS — I428 Other cardiomyopathies: Secondary | ICD-10-CM | POA: Diagnosis not present

## 2020-06-24 DIAGNOSIS — N1832 Chronic kidney disease, stage 3b: Secondary | ICD-10-CM

## 2020-06-24 DIAGNOSIS — Z23 Encounter for immunization: Secondary | ICD-10-CM

## 2020-06-24 DIAGNOSIS — E1151 Type 2 diabetes mellitus with diabetic peripheral angiopathy without gangrene: Secondary | ICD-10-CM

## 2020-06-24 DIAGNOSIS — I495 Sick sinus syndrome: Secondary | ICD-10-CM

## 2020-06-24 DIAGNOSIS — R0602 Shortness of breath: Secondary | ICD-10-CM

## 2020-06-24 DIAGNOSIS — I5022 Chronic systolic (congestive) heart failure: Secondary | ICD-10-CM

## 2020-06-24 NOTE — Progress Notes (Signed)
[name], male   DOB: [birth year], 74 y.o.   MRN: [id number]   Subjective:    [name], male    DOB: Apr 10, 1946, 74 y.o.   MRN: [id number]  HPI This visit occurred during the SARS-CoV-2 public health emergency.  Safety protocols were in place, including screening questions prior to the visit, additional usage of staff PPE, and extensive cleaning of exam room while observing appropriate contact time as indicated for disinfecting solutions.  Patient here for a scheduled follow up.  Has a history of long standing diabetes with stage 3a CKD. Being followed by nephrology.  Also being followed by vascular surgery for lower extremity swelling.  UNNA wraps in place.  Seeing them on a regular basis.  No chest pain.  Breathing overall stable.  Some days breathing varies, but overall he feels is stable.  Per report on lasix.  Eating.  No nausea or vomiting.  Bowels moving.  Discussed importance of monitoring his weight.  Sees cardiology.  Continuous glucose monitoring.  Discussed importance of trying to avoid low sugars.    Past Medical History:  Diagnosis Date  . CHF (congestive heart failure) ([place])   . Depression   . 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 Gastroparesis   . Graves disease   . Hypercholesterolemia   . Hypertension   . Mantle cell lymphoma ([place]) 07/2014  . Peripheral neuropathy    Past Surgical History:  Procedure Laterality Date  . CHOLECYSTECTOMY    . NEPHRECTOMY     right  . PACEMAKER INSERTION N/A 12/07/2017   Procedure: INSERTION PACEMAKER DUAL CHAMBER INITIAL INSERT;  Surgeon: Isaias Cowman, MD;  Location: [facility];  Service: Cardiovascular;  Laterality: N/A;  . PORTA CATH INSERTION N/A 11/03/2017   Procedure: PORTA CATH INSERTION;  Surgeon: Katha Cabal, MD;  Location: [facility];  Service: Cardiovascular;  Laterality: N/A;   Family History  Problem Relation Age of  Onset  . Breast cancer Mother   . Diabetes Father   . Cancer Father        Lymphoma  . Alcohol abuse Maternal Uncle   . Diabetes Sister   . Heart disease Other        grandfather   Social History   Socioeconomic History  . Marital status: Married    Spouse name: Not on file  . Number of children: 1  . Years of education: Not on file  . Highest education level: Not on file  Occupational History  . Not on file  Tobacco Use  . Smoking status: Former Research scientist (life sciences)  . Smokeless tobacco: Current User    Types: Chew  Vaping Use  . Vaping Use: Never used  Substance and Sexual Activity  . Alcohol use: Yes    Alcohol/week: 3.0 standard drinks    Types: 3 Cans of beer per week    Comment: nightly  . Drug use: Never  . Sexual activity: Not Currently  Other Topics Concern  . Not on file  Social History Narrative  . Not on file   Social Determinants of Health   Financial Resource Strain: Unknown  . Difficulty of Paying Living Expenses: Patient refused  Food Insecurity: Unknown  . Worried About Charity fundraiser in the Last Year: Patient refused  . Ran Out of Food in the Last Year: Patient refused  Transportation Needs: No Transportation Needs  . Lack of Transportation (Medical): No  .  Lack of Transportation (Non-Medical): No  Physical Activity: Inactive  . Days of Exercise per Week: 0 days  . Minutes of Exercise per Session: 0 min  Stress: No Stress Concern Present  . Feeling of Stress : Not at all  Social Connections: Unknown  . Frequency of Communication with Friends and Family: Patient refused  . Frequency of Social Gatherings with Friends and Family: Patient refused  . Attends Religious Services: Patient refused  . Active Member of Clubs or Organizations: Patient refused  . Attends Archivist Meetings: Patient refused  . Marital Status: Patient refused    Outpatient Encounter Medications as of 06/24/2020  Medication Sig  . ACCU-CHEK AVIVA PLUS test strip USE  AS INSTRUCTED. USE 1 STRIP EVERY 3 HOURS . ACCU-CHECK AVIVA PLUS TEST STRIP. DX M46.803  . acetaminophen (TYLENOL) 500 MG tablet Take 500 mg by mouth every 6 (six) hours as needed.   . cyanocobalamin (,VITAMIN B-12,) 1000 MCG/ML injection Inject into the muscle.  . ferrous sulfate 325 (65 FE) MG tablet Take 325 mg by mouth daily.  . furosemide (LASIX) 20 MG tablet Take 40 mg by mouth 2 (two) times daily. 2 tablets in the morning 1 tablet in the afternoon as needed  . GLUCAGON EMERGENCY 1 MG injection   . glucagon, human recombinant, (GLUCAGEN DIAGNOSTIC) 1 MG injection Use as directed  . glucose 4 GM chewable tablet Chew 1 tablet by mouth once as needed for low blood sugar.  Marland Kitchen glucose blood (PRECISION QID TEST) test strip Use 8 (eight) times daily as directed. BAYER CONTOUR NEXT TEST STRIPS E10.649  . insulin aspart (NOVOLOG) 100 UNIT/ML injection Basal rate 12am 0.8 units per hour, 9am 0.9 units per hour. (total basal insulin 20.7 units). Carbohydrate ratio 1 units for every 8gm of carbohydrate. Correction factor 1 units for every 71m/dl over target cbg. Target CBG 80-120  . nystatin cream (MYCOSTATIN) APPLY TO AFFECTED AREA TWICE A DAY  . pravastatin (PRAVACHOL) 40 MG tablet Take 40 mg by mouth daily.   . prochlorperazine (COMPAZINE) 10 MG tablet TAKE 1 TABLET (10 MG TOTAL) BY MOUTH EVERY 6 (SIX) HOURS AS NEEDED (NAUSEA OR VOMITING).  . vitamin B-12 (CYANOCOBALAMIN) 250 MCG tablet Take 250 mcg by mouth daily.  . Vitamin D, Cholecalciferol, 10 MCG (400 UNIT) TABS Take by mouth.  . carvedilol (COREG) 3.125 MG tablet Take 3.125 mg by mouth 2 (two) times daily with a meal.    Facility-Administered Encounter Medications as of 06/24/2020  Medication  . heparin lock flush 100 unit/mL  . sodium chloride flush (NS) 0.9 % injection 10 mL  . sodium chloride flush (NS) 0.9 % injection 10 mL    Review of Systems  Constitutional: Negative for appetite change and unexpected weight change.  HENT:  Negative for congestion and sinus pressure.   Respiratory: Negative for cough and chest tightness.        Breathing stable.    Cardiovascular: Positive for leg swelling. Negative for chest pain and palpitations.  Gastrointestinal: Negative for abdominal pain, diarrhea, nausea and vomiting.  Genitourinary: Negative for difficulty urinating and dysuria.  Musculoskeletal: Negative for joint swelling and myalgias.  Skin: Negative for color change and rash.  Neurological: Negative for dizziness, light-headedness and headaches.  Psychiatric/Behavioral: Negative for agitation and dysphoric mood.       Objective:    Physical Exam Vitals reviewed.  Constitutional:      General: He is not in acute distress.    Appearance: Normal appearance. He is  well-developed.  HENT:     Head: Normocephalic and atraumatic.     Right Ear: External ear normal.     Left Ear: External ear normal.  Eyes:     General: No scleral icterus.       Right eye: No discharge.        Left eye: No discharge.     Conjunctiva/sclera: Conjunctivae normal.  Cardiovascular:     Rate and Rhythm: Normal rate and regular rhythm.  Pulmonary:     Effort: Pulmonary effort is normal. No respiratory distress.     Breath sounds: Normal breath sounds.  Abdominal:     General: Bowel sounds are normal.     Palpations: Abdomen is soft.     Tenderness: There is no abdominal tenderness.  Musculoskeletal:        General: No tenderness.     Cervical back: Neck supple. No tenderness.     Comments: UNNA wraps in place.    Lymphadenopathy:     Cervical: No cervical adenopathy.  Skin:    Findings: No erythema or rash.  Neurological:     Mental Status: He is alert.  Psychiatric:        Mood and Affect: Mood normal.        Behavior: Behavior normal.     BP 130/62 (BP Location: Left Arm, Patient Position: Sitting)   Pulse (!) 108   Temp 98.9 F (37.2 C)   Ht 5' 10" (1.778 m)   Wt 281 lb 3.2 oz (127.6 kg)   SpO2 98%   BMI  40.35 kg/m  Wt Readings from Last 3 Encounters:  06/26/20 280 lb (127 kg)  06/24/20 281 lb 3.2 oz (127.6 kg)  06/19/20 279 lb (126.6 kg)     Lab Results  Component Value Date   WBC 6.3 06/24/2020   HGB 13.1 06/24/2020   HCT 39.0 06/24/2020   PLT 131.0 (L) 06/24/2020   GLUCOSE 46 (LL) 06/24/2020   CHOL 132 06/24/2020   TRIG 61.0 06/24/2020   HDL 51.50 06/24/2020   LDLCALC 68 06/24/2020   ALT 7 06/24/2020   AST 13 06/24/2020   NA 143 06/24/2020   K 3.3 (L) 06/24/2020   CL 101 06/24/2020   CREATININE 1.44 06/24/2020   BUN 20 06/24/2020   CO2 31 06/24/2020   TSH 0.98 06/24/2020   INR 1.12 07/14/2018   HGBA1C 6.8 (H) 06/24/2020   MICROALBUR 1.9 09/22/2016    CT Chest W Contrast  Result Date: 01/24/2020 CLINICAL DATA:  Restaging mantle cell lymphoma. EXAM: CT CHEST, ABDOMEN, AND PELVIS WITH CONTRAST TECHNIQUE: Multidetector CT imaging of the chest, abdomen and pelvis was performed following the standard protocol during bolus administration of intravenous contrast. CONTRAST:  106m OMNIPAQUE IOHEXOL 300 MG/ML  SOLN COMPARISON:  07/07/2019 FINDINGS: CT CHEST FINDINGS Cardiovascular: There is a left chest wall pacer with leads in the right atrial appendage and right ventricle. Lad, left circumflex and RCA coronary artery calcifications. Normal heart size. No pericardial effusion. Mediastinum/Nodes: No enlarged mediastinal, hilar, or axillary lymph nodes. Thyroid gland, trachea, and esophagus demonstrate no significant findings. Lungs/Pleura: Paraseptal emphysema. No pleural effusion, airspace consolidation, or atelectasis. Musculoskeletal: Chronic right anterior rib deformity appears healed, image 30/3. Old healed deformity of the proximal right humerus. CT ABDOMEN PELVIS FINDINGS Hepatobiliary: No focal liver abnormality is seen. Status post cholecystectomy. No biliary dilatation. Pancreas: Unremarkable. No pancreatic ductal dilatation or surrounding inflammatory changes. Spleen: Normal in  size without focal abnormality. Adrenals/Urinary Tract: Normal appearance of  the adrenal glands. Solitary left kidney. Small cyst is again noted arising from inferior pole of the left kidney measuring 1 cm, image 76/2. Urinary bladder is unremarkable. Stomach/Bowel: Stomach is within normal limits. Appendix not visualized. No evidence of bowel wall thickening, distention, or inflammatory changes. Vascular/Lymphatic: Aortic atherosclerosis. No aneurysm. -index left common iliac lymph node measures 1.1 cm short axis, image 83/2. Previously 1.2 cm. Index left periaortic lymph node measures 0.7 cm, image 77/2. Previously 1 cm. Index right common iliac lymph node measures 0.9 cm, image 92/2. Previously 1 cm. Reproductive: Prostate is unremarkable. Other: No free fluid or fluid collections. Fat containing left inguinal hernia. Musculoskeletal: No acute or significant osseous findings. IMPRESSION: 1. No significant change compared with previous exam. Stable to decrease in size of borderline iliac lymph nodes. 2. Three vessel coronary artery calcifications noted. 3. Solitary left kidney. 4. Fat containing left inguinal hernia. 5. Emphysema and aortic atherosclerosis. Aortic Atherosclerosis (ICD10-I70.0) and Emphysema (ICD10-J43.9). Electronically Signed   By: Kerby Moors M.D.   On: 01/24/2020 13:48   CT ABDOMEN PELVIS W CONTRAST  Result Date: 01/24/2020 CLINICAL DATA:  Restaging mantle cell lymphoma. EXAM: CT CHEST, ABDOMEN, AND PELVIS WITH CONTRAST TECHNIQUE: Multidetector CT imaging of the chest, abdomen and pelvis was performed following the standard protocol during bolus administration of intravenous contrast. CONTRAST:  43m OMNIPAQUE IOHEXOL 300 MG/ML  SOLN COMPARISON:  07/07/2019 FINDINGS: CT CHEST FINDINGS Cardiovascular: There is a left chest wall pacer with leads in the right atrial appendage and right ventricle. Lad, left circumflex and RCA coronary artery calcifications. Normal heart size. No pericardial  effusion. Mediastinum/Nodes: No enlarged mediastinal, hilar, or axillary lymph nodes. Thyroid gland, trachea, and esophagus demonstrate no significant findings. Lungs/Pleura: Paraseptal emphysema. No pleural effusion, airspace consolidation, or atelectasis. Musculoskeletal: Chronic right anterior rib deformity appears healed, image 30/3. Old healed deformity of the proximal right humerus. CT ABDOMEN PELVIS FINDINGS Hepatobiliary: No focal liver abnormality is seen. Status post cholecystectomy. No biliary dilatation. Pancreas: Unremarkable. No pancreatic ductal dilatation or surrounding inflammatory changes. Spleen: Normal in size without focal abnormality. Adrenals/Urinary Tract: Normal appearance of the adrenal glands. Solitary left kidney. Small cyst is again noted arising from inferior pole of the left kidney measuring 1 cm, image 76/2. Urinary bladder is unremarkable. Stomach/Bowel: Stomach is within normal limits. Appendix not visualized. No evidence of bowel wall thickening, distention, or inflammatory changes. Vascular/Lymphatic: Aortic atherosclerosis. No aneurysm. -index left common iliac lymph node measures 1.1 cm short axis, image 83/2. Previously 1.2 cm. Index left periaortic lymph node measures 0.7 cm, image 77/2. Previously 1 cm. Index right common iliac lymph node measures 0.9 cm, image 92/2. Previously 1 cm. Reproductive: Prostate is unremarkable. Other: No free fluid or fluid collections. Fat containing left inguinal hernia. Musculoskeletal: No acute or significant osseous findings. IMPRESSION: 1. No significant change compared with previous exam. Stable to decrease in size of borderline iliac lymph nodes. 2. Three vessel coronary artery calcifications noted. 3. Solitary left kidney. 4. Fat containing left inguinal hernia. 5. Emphysema and aortic atherosclerosis. Aortic Atherosclerosis (ICD10-I70.0) and Emphysema (ICD10-J43.9). Electronically Signed   By: TKerby MoorsM.D.   On: 01/24/2020 13:48         Assessment & Plan:   Problem List Items Addressed This Visit    Thyroid disease - Primary    Followed by endocrinology.       Relevant Orders   TSH (Completed)   SOB (shortness of breath)    He feels his breathing is stable overall.  Discussed following his weight and monitoring for volume overload.  Discussed low sodium diet.  On lasix.  Follow.        Sick sinus syndrome [facility])    S/p pacemaker placement.       Mantle cell lymphoma ([place])    Followed by oncology.  They are monitoring.  Recent scan as outlined.        Relevant Orders   CBC with Differential/Platelet (Completed)   Lymphedema    Followed by [facility].  UNNA wraps in place.        Hypercholesterolemia    On pravastatin.  Low cholesterol diet and exercise.  Follow lipid panel and liver function tests.        Relevant Orders   Hepatic function panel (Completed)   Lipid panel (Completed)   Essential hypertension, benign    Blood pressure as outlined.  Continue coreg, lasix.  Follow pressures.  Follow metabolic panel.       Diabetes ([place])    Followed by endocrinology.  Discussed importance of avoiding low sugars.  Follow met b and a1c.  Continues on insulin.        Relevant Orders   Hemoglobin A1c (Completed)   Basic metabolic panel (Completed)   CKD (chronic kidney disease) stage 3, GFR 30-59 ml/min    Followed by nephrology.       Chronic systolic CHF (congestive heart failure) ([place])    Followed by cardiology.  On lasix.  Discussed importance of monitoring his weight daily.  Follow.        Cardiomyopathy, idiopathic ([place])    Followed by cardiology.  Breathing stable.  Continue lasix.        Anemia    Follow cbc.        Other Visit Diagnoses    Need for immunization against influenza       Relevant Orders   Flu Vaccine QUAD High Dose(Fluad) (Completed)       [name], MD

## 2020-06-25 ENCOUNTER — Telehealth: Payer: Self-pay

## 2020-06-25 LAB — LIPID PANEL
Cholesterol: 132 mg/dL (ref 0–200)
HDL: 51.5 mg/dL (ref >39.00)
LDL Cholesterol: 68 mg/dL (ref 0–99)
NonHDL: 80.24
Total CHOL/HDL Ratio: 3
Triglycerides: 61 mg/dL (ref 0.0–149.0)
VLDL: 12.2 mg/dL (ref 0.0–40.0)

## 2020-06-25 LAB — BASIC METABOLIC PANEL
BUN: 20 mg/dL (ref 6–23)
CO2: 31 mEq/L (ref 19–32)
Calcium: 8.1 mg/dL — ABNORMAL LOW (ref 8.4–10.5)
Chloride: 101 mEq/L (ref 96–112)
Creatinine, Ser: 1.44 mg/dL (ref 0.40–1.50)
GFR: 48.23 mL/min — ABNORMAL LOW (ref >60.00)
Glucose, Bld: 46 mg/dL — CL (ref 70–99)
Potassium: 3.3 mEq/L — ABNORMAL LOW (ref 3.5–5.1)
Sodium: 143 mEq/L (ref 135–145)

## 2020-06-25 LAB — CBC WITH DIFFERENTIAL/PLATELET
Basophils Absolute: 0.1 10*3/uL (ref 0.0–0.1)
Basophils Relative: 1 % (ref 0.0–3.0)
Eosinophils Absolute: 0.1 10*3/uL (ref 0.0–0.7)
Eosinophils Relative: 2.1 % (ref 0.0–5.0)
HCT: 39 % (ref 39.0–52.0)
Hemoglobin: 13.1 g/dL (ref 13.0–17.0)
Lymphocytes Relative: 15.2 % (ref 12.0–46.0)
Lymphs Abs: 1 10*3/uL (ref 0.7–4.0)
MCHC: 33.5 g/dL (ref 30.0–36.0)
MCV: 94.2 fl (ref 78.0–100.0)
Monocytes Absolute: 0.5 10*3/uL (ref 0.1–1.0)
Monocytes Relative: 7.8 % (ref 3.0–12.0)
Neutro Abs: 4.7 10*3/uL (ref 1.4–7.7)
Neutrophils Relative %: 73.9 % (ref 43.0–77.0)
Platelets: 131 10*3/uL — ABNORMAL LOW (ref 150.0–400.0)
RBC: 4.14 Mil/uL — ABNORMAL LOW (ref 4.22–5.81)
RDW: 12.9 % (ref 11.5–15.5)
WBC: 6.3 10*3/uL (ref 4.0–10.5)

## 2020-06-25 LAB — HEMOGLOBIN A1C: Hgb A1c MFr Bld: 6.8 % — ABNORMAL HIGH (ref 4.6–6.5)

## 2020-06-25 LAB — HEPATIC FUNCTION PANEL
ALT: 7 U/L (ref 0–53)
AST: 13 U/L (ref 0–37)
Albumin: 3.7 g/dL (ref 3.5–5.2)
Alkaline Phosphatase: 141 U/L — ABNORMAL HIGH (ref 39–117)
Bilirubin, Direct: 0.1 mg/dL (ref 0.0–0.3)
Total Bilirubin: 0.4 mg/dL (ref 0.2–1.2)
Total Protein: 6.6 g/dL (ref 6.0–8.3)

## 2020-06-25 LAB — TSH: TSH: 0.98 u[IU]/mL (ref 0.35–4.50)

## 2020-06-25 NOTE — Telephone Encounter (Signed)
CRITICAL VALUE STICKER  CRITICAL VALUE: Glucose: 46  RECEIVER (on-site recipient of call): Lady Wisham  DATE & TIME NOTIFIED: 06/25/20; 11:08  MESSENGER (representative from lab): [email or web address]  MD NOTIFIED: Dr. Nicki Reaper   TIME OF NOTIFICATION: 11:10  RESPONSE:

## 2020-06-25 NOTE — Telephone Encounter (Signed)
Called and spoke to Gary [name]. She states that he is doing ok and is currently not home. He checked his sugars this morning and she remembers it to be 178.

## 2020-06-25 NOTE — Telephone Encounter (Signed)
Please call pt and confirm he is doing ok.  Has he checked his sugar today?  Confirm eating.

## 2020-06-26 ENCOUNTER — Ambulatory Visit (INDEPENDENT_AMBULATORY_CARE_PROVIDER_SITE_OTHER): Payer: Medicare Other | Admitting: Nurse Practitioner

## 2020-06-26 ENCOUNTER — Encounter (INDEPENDENT_AMBULATORY_CARE_PROVIDER_SITE_OTHER): Payer: Self-pay | Admitting: Nurse Practitioner

## 2020-06-26 ENCOUNTER — Other Ambulatory Visit: Payer: Self-pay

## 2020-06-26 VITALS — BP 125/68 | HR 89 | Ht 70.0 in | Wt 280.0 lb

## 2020-06-26 DIAGNOSIS — I890 Lymphedema, not elsewhere classified: Secondary | ICD-10-CM

## 2020-06-26 DIAGNOSIS — R600 Localized edema: Secondary | ICD-10-CM | POA: Diagnosis not present

## 2020-06-26 DIAGNOSIS — E876 Hypokalemia: Secondary | ICD-10-CM | POA: Diagnosis not present

## 2020-06-26 DIAGNOSIS — N1831 Chronic kidney disease, stage 3a: Secondary | ICD-10-CM | POA: Diagnosis not present

## 2020-06-26 DIAGNOSIS — E1029 Type 1 diabetes mellitus with other diabetic kidney complication: Secondary | ICD-10-CM | POA: Diagnosis not present

## 2020-06-26 DIAGNOSIS — L03119 Cellulitis of unspecified part of limb: Secondary | ICD-10-CM | POA: Diagnosis not present

## 2020-06-26 DIAGNOSIS — L97201 Non-pressure chronic ulcer of unspecified calf limited to breakdown of skin: Secondary | ICD-10-CM | POA: Diagnosis not present

## 2020-06-28 ENCOUNTER — Other Ambulatory Visit: Payer: Self-pay | Admitting: Internal Medicine

## 2020-06-28 DIAGNOSIS — E876 Hypokalemia: Secondary | ICD-10-CM

## 2020-06-28 NOTE — Progress Notes (Signed)
Order placed for f/u potassium.  

## 2020-06-30 ENCOUNTER — Telehealth: Payer: Self-pay | Admitting: Internal Medicine

## 2020-06-30 ENCOUNTER — Encounter: Payer: Self-pay | Admitting: Internal Medicine

## 2020-06-30 NOTE — Assessment & Plan Note (Signed)
Followed by cardiology.  On lasix.  Discussed importance of monitoring his weight daily.  Follow.

## 2020-06-30 NOTE — Assessment & Plan Note (Signed)
Followed by cardiology.  Breathing stable.  Continue lasix.

## 2020-06-30 NOTE — Assessment & Plan Note (Signed)
Followed by endocrinology 

## 2020-06-30 NOTE — Assessment & Plan Note (Signed)
Followed by endocrinology.  Discussed importance of avoiding low sugars.  Follow met b and a1c.  Continues on insulin.

## 2020-06-30 NOTE — Assessment & Plan Note (Signed)
He feels his breathing is stable overall. Discussed following his weight and monitoring for volume overload.  Discussed low sodium diet.  On lasix.  Follow.

## 2020-06-30 NOTE — Assessment & Plan Note (Signed)
Followed by nephrology. 

## 2020-06-30 NOTE — Assessment & Plan Note (Signed)
Blood pressure as outlined.  Continue coreg, lasix.  Follow pressures.  Follow metabolic panel.

## 2020-06-30 NOTE — Assessment & Plan Note (Signed)
S/p pacemaker placement.

## 2020-06-30 NOTE — Assessment & Plan Note (Signed)
On pravastatin.  Low cholesterol diet and exercise.  Follow lipid panel and liver function tests.   

## 2020-06-30 NOTE — Telephone Encounter (Signed)
Per review of chart, he is past due colonoscopy.  Is he agreeable for f/u GI appt.

## 2020-06-30 NOTE — Assessment & Plan Note (Signed)
Followed by oncology.  They are monitoring.  Recent scan as outlined.

## 2020-06-30 NOTE — Assessment & Plan Note (Signed)
Followed by [facility].  UNNA wraps in place.

## 2020-06-30 NOTE — Assessment & Plan Note (Signed)
Follow [facility].  

## 2020-07-01 ENCOUNTER — Encounter (INDEPENDENT_AMBULATORY_CARE_PROVIDER_SITE_OTHER): Payer: Self-pay | Admitting: Nurse Practitioner

## 2020-07-01 NOTE — Progress Notes (Signed)
Subjective:    [name], male    DOB: 17-Dec-1945, 74 y.o.   MRN: [id number] Chief Complaint  Patient presents with   Follow-up    Bil unna boot check    The patient is following up today for evaluation of his lower extremity edema and ulcerations.  The patient's right lower extremity appears to be improved with previous ulcerations mostly healing.  The left lower extremity previously had some draining areas however that is dried up at this time.  The patient denies any significant weeping such as before.  He does not endorse significant pain such as he has before with cellulitis.  He denies any fever, chills, nausea, vomiting or diarrhea.  He denies any extensive change in his fluid status or worsening shortness of breath.    Review of Systems  Cardiovascular: Positive for leg swelling.  Skin: Positive for wound.  Neurological: Positive for weakness.       Objective:   Physical Exam Vitals reviewed.  HENT:     Head: Normocephalic.  Cardiovascular:     Rate and Rhythm: Normal rate and regular rhythm.     Pulses: Normal pulses.  Musculoskeletal:     Right lower leg: Edema present.     Left lower leg: Edema present.  Skin:    General: Skin is warm and dry.  Neurological:     Mental Status: He is alert and oriented to person, place, and time.  Psychiatric:        Mood and Affect: Mood normal.        Behavior: Behavior normal.        Thought Content: Thought content normal.        Judgment: Judgment normal.     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    Gastroparesis    Graves disease    Hypercholesterolemia    Hypertension    Mantle cell lymphoma ([place]) 07/2014   Peripheral neuropathy     Social History    Socioeconomic History   Marital status: Married    Spouse name: Not on file   Number of children: 1   Years of education: Not on file   Highest education level: Not on file  Occupational History   Not on file  Tobacco Use   Smoking status: Former Smoker   Smokeless tobacco: Current User    Types: Nurse, children's Use: Never used  Substance and Sexual Activity   Alcohol use: Yes    Alcohol/week: 3.0 standard drinks    Types: 3 Cans of beer per week    Comment: nightly   Drug use: Never   Sexual activity: Not Currently  Other Topics Concern   Not on file  Social History Narrative   Not on file   Social Determinants of Health   Financial Resource Strain: Unknown   Difficulty of Paying Living Expenses: Patient refused  Food Insecurity: Unknown   Worried About Charity fundraiser in the Last Year: Patient refused   [place] in the Last Year: Patient refused  Transportation Needs: No Data processing manager (Medical):  No   Lack of Transportation (Non-Medical): No  Physical Activity: Inactive   Days of Exercise per Week: 0 days   Minutes of Exercise per Session: 0 min  Stress: No Stress Concern Present   Feeling of Stress : Not at all  Social Connections: Unknown   Frequency of Communication with Friends and Family: Patient refused   Frequency of Social Gatherings with Friends and Family: Patient refused   Attends Religious Services: Patient refused   Marine scientist or Organizations: Patient refused   Attends Archivist Meetings: Patient refused   Marital Status: Patient refused  Intimate Partner Violence: Unknown   Fear of Current or Ex-Partner: Patient refused   Emotionally Abused: Patient refused   Physically Abused: Patient refused   Sexually Abused: Patient refused    Past Surgical History:  Procedure Laterality Date   CHOLECYSTECTOMY     NEPHRECTOMY     right    PACEMAKER INSERTION N/A 12/07/2017   Procedure: INSERTION PACEMAKER DUAL CHAMBER INITIAL INSERT;  Surgeon: Isaias Cowman, MD;  Location: [facility]S;  Service: Cardiovascular;  Laterality: N/A;   PORTA CATH INSERTION N/A 11/03/2017   Procedure: PORTA CATH INSERTION;  Surgeon: Katha Cabal, MD;  Location: [facility];  Service: Cardiovascular;  Laterality: N/A;    Family History  Problem Relation Age of Onset   Breast cancer Mother    Diabetes Father    Cancer Father        Lymphoma   Alcohol abuse Maternal Uncle    Diabetes Sister    Heart disease Other        grandfather    Allergies  Allergen Reactions   Rofecoxib Nausea Only       Assessment & Plan:   1. Skin ulcer of calf, limited to breakdown of skin, unspecified laterality [facility]) The patient will remain in bilateral Unna wraps.  Overall however the patient's legs have improved.  The status of the ulcerations have also improved.  The patient is also tolerating the Unna wraps well.  2. Cellulitis of lower extremity, unspecified laterality No evidence of cellulitis today.  3. Lymphedema  No surgery or intervention at this point in time.    I have reviewed my discussion with the patient regarding lymphedema and why it  causes symptoms.  Patient will continue wearing graduated compression stockings class 1 (20-30 mmHg) on a daily basis a prescription was given. The patient is reminded to put the stockings on first thing in the morning and removing them in the evening. The patient is instructed specifically not to sleep in the stockings.   In addition, behavioral modification throughout the day will be continued.  This will include frequent elevation (such as in a recliner), use of over the counter pain medications as needed and exercise such as walking.  I have reviewed systemic causes for chronic edema such as liver, kidney and cardiac etiologies and there does not appear to be any significant changes  in these organ systems over the past year.  The patient is under the impression that these organ systems are all stable and unchanged.    The patient will continue aggressive use of the  lymph pump.  This will continue to improve the edema control and prevent sequela such as ulcers and infections.      Current Outpatient Medications on File Prior to Visit  Medication Sig Dispense Refill   ACCU-CHEK AVIVA PLUS test strip USE AS INSTRUCTED. USE 1 STRIP EVERY 3 HOURS .  ACCU-CHECK AVIVA PLUS TEST STRIP. DX E10.649  12   acetaminophen (TYLENOL) 500 MG tablet Take 500 mg by mouth every 6 (six) hours as needed.      carvedilol (COREG) 3.125 MG tablet Take 1 tablet by mouth 2 (two) times daily with a meal.     cyanocobalamin (,VITAMIN B-12,) 1000 MCG/ML injection Inject into the muscle.     ferrous sulfate 325 (65 FE) MG tablet Take 325 mg by mouth daily.     furosemide (LASIX) 20 MG tablet Take 40 mg by mouth 2 (two) times daily. 2 tablets in the morning 1 tablet in the afternoon as needed     GLUCAGON EMERGENCY 1 MG injection      glucagon, human recombinant, (GLUCAGEN DIAGNOSTIC) 1 MG injection Use as directed     glucose 4 GM chewable tablet Chew 1 tablet by mouth once as needed for low blood sugar.     glucose blood (PRECISION QID TEST) test strip Use 8 (eight) times daily as directed. BAYER CONTOUR NEXT TEST STRIPS E10.649     insulin aspart (NOVOLOG) 100 UNIT/ML injection Basal rate 12am 0.8 units per hour, 9am 0.9 units per hour. (total basal insulin 20.7 units). Carbohydrate ratio 1 units for every 8gm of carbohydrate. Correction factor 1 units for every 50mg /dl over target cbg. Target CBG 80-120 1 vial 12   nystatin cream (MYCOSTATIN) APPLY TO AFFECTED AREA TWICE A DAY 30 g 0   pravastatin (PRAVACHOL) 40 MG tablet Take 40 mg by mouth daily.      prochlorperazine (COMPAZINE) 10 MG tablet TAKE 1 TABLET (10 MG TOTAL) BY MOUTH EVERY 6 (SIX) HOURS AS NEEDED (NAUSEA OR VOMITING).       vitamin B-12 (CYANOCOBALAMIN) 250 MCG tablet Take 250 mcg by mouth daily.     Vitamin D, Cholecalciferol, 10 MCG (400 UNIT) TABS Take by mouth.     carvedilol (COREG) 3.125 MG tablet Take 3.125 mg by mouth 2 (two) times daily with a meal.      Current Facility-Administered Medications on File Prior to Visit  Medication Dose Route Frequency Provider Last Rate Last Admin   heparin lock flush 100 unit/mL  500 Units Intravenous Once Lloyd Huger, MD       sodium chloride flush (NS) 0.9 % injection 10 mL  10 mL Intravenous PRN Lloyd Huger, MD   10 mL at 02/01/18 0829   sodium chloride flush (NS) 0.9 % injection 10 mL  10 mL Intravenous PRN Lloyd Huger, MD   10 mL at 04/05/18 0800    There are no Patient Instructions on file for this visit. No follow-ups on file.   Kris Hartmann, NP

## 2020-07-01 NOTE — Telephone Encounter (Signed)
Can he do virtual visit tomorrow am with me?  We can discuss and can see if his wife can hold camera where I can see rash.

## 2020-07-01 NOTE — Telephone Encounter (Signed)
Patient informed and verbalized understanding.  He declines Colonoscopy at this time.   Patient reported having back pain that started this morning. Rated 4/10 pain with an accompanying rash. Rash goes down the center of his back and is a cluster of small bumps. Rash does not itch but it does hurt. Patient states it is mainly irritating and drains him of his energy.   Please advise

## 2020-07-01 NOTE — Telephone Encounter (Signed)
Patient scheduled for 9/21 at 8:00 am virtually.

## 2020-07-02 ENCOUNTER — Telehealth (INDEPENDENT_AMBULATORY_CARE_PROVIDER_SITE_OTHER): Payer: Medicare Other | Admitting: Internal Medicine

## 2020-07-02 ENCOUNTER — Encounter: Payer: Self-pay | Admitting: Internal Medicine

## 2020-07-02 DIAGNOSIS — E1151 Type 2 diabetes mellitus with diabetic peripheral angiopathy without gangrene: Secondary | ICD-10-CM

## 2020-07-02 DIAGNOSIS — Z1211 Encounter for screening for malignant neoplasm of colon: Secondary | ICD-10-CM | POA: Diagnosis not present

## 2020-07-02 DIAGNOSIS — R21 Rash and other nonspecific skin eruption: Secondary | ICD-10-CM | POA: Diagnosis not present

## 2020-07-02 MED ORDER — VALACYCLOVIR HCL 1 G PO TABS: 1000.0000 mg | ORAL_TABLET | Freq: Three times a day (TID) | ORAL | 0 refills | Status: DC

## 2020-07-02 NOTE — Progress Notes (Signed)
[name], male   DOB: 06/13/1946, 74 y.o.   MRN: [id number]   Virtual Visit via telephone Note  This visit type was conducted due to national recommendations for restrictions regarding the COVID-19 pandemic (e.g. social distancing).  This format is felt to be most appropriate for this patient at this time.  All issues noted in this document were discussed and addressed.  No physical exam was performed (except for noted visual exam findings with Video Visits).   I connected with Gary [name] by telephone and verified that I am speaking with the correct person using two identifiers. Location patient: home Location provider: work Persons participating in the virtual visit: patient, provider and pts wife  Te limitations, risks, security and privacy concerns of performing an evaluation and management service by telephone and the availability of in person appointments have been discussed.  It has also been discussed with the patient that there may be a patient responsible charge related to this service. The patient expressed understanding and agreed to proceed.   Reason for visit: work in appt  HPI: Work in appt for rash and back pain.  States symptoms started Sunday pm.  Started with low back pain.  Described pain as a burning pain.  Localized to low back. No pain radiating down leg.  Wife got on phone and described rash.  She describes clusters of blisters.  Appears to be more localized to his low back and left buttock.  No fever.  No vomiting or diarrhea.  He is eating and drinking.  Taking tylenol and aspirin.  Does help with pain.  Instructed not to take an increased amount of aspirin and just use the tylenol.  He is not taking more than 6 tylenol per day.  Some days only four.  Discussed shingles.  Discussed treatment.     ROS: See pertinent positives and negatives per HPI.  Past Medical History:  Diagnosis Date  . CHF (congestive heart failure) ([place])   . Depression   .  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 Gastroparesis   . Graves disease   . Hypercholesterolemia   . Hypertension   . Mantle cell lymphoma ([place]) 07/2014  . Peripheral neuropathy     Past Surgical History:  Procedure Laterality Date  . CHOLECYSTECTOMY    . NEPHRECTOMY     right  . PACEMAKER INSERTION N/A 12/07/2017   Procedure: INSERTION PACEMAKER DUAL CHAMBER INITIAL INSERT;  Surgeon: Isaias Cowman, MD;  Location: [facility];  Service: Cardiovascular;  Laterality: N/A;  . PORTA CATH INSERTION N/A 11/03/2017   Procedure: PORTA CATH INSERTION;  Surgeon: Katha Cabal, MD;  Location: [facility];  Service: Cardiovascular;  Laterality: N/A;    Family History  Problem Relation Age of Onset  . Breast cancer Mother   . Diabetes Father   . Cancer Father        Lymphoma  . Alcohol abuse Maternal Uncle   . Diabetes Sister   . Heart disease Other        grandfather    SOCIAL HX: reviewed.    Current Outpatient Medications:  .  ACCU-CHEK AVIVA PLUS test strip, USE AS INSTRUCTED. USE 1 STRIP EVERY 3 HOURS . ACCU-CHECK AVIVA PLUS TEST STRIP. DX E10.649, Disp: , Rfl: 12 .  acetaminophen (TYLENOL) 500 MG tablet, Take 500 mg by mouth every 6 (six) hours as needed. , Disp: , Rfl:  .  carvedilol (COREG)  3.125 MG tablet, Take 3.125 mg by mouth 2 (two) times daily with a meal. , Disp: , Rfl:  .  carvedilol (COREG) 3.125 MG tablet, Take 1 tablet by mouth 2 (two) times daily with a meal., Disp: , Rfl:  .  cyanocobalamin (,VITAMIN B-12,) 1000 MCG/ML injection, Inject into the muscle., Disp: , Rfl:  .  ferrous sulfate 325 (65 FE) MG tablet, Take 325 mg by mouth daily., Disp: , Rfl:  .  furosemide (LASIX) 20 MG tablet, Take 40 mg by mouth 2 (two) times daily. 2 tablets in the morning 1 tablet in the afternoon as needed, Disp: , Rfl:  .  GLUCAGON EMERGENCY 1 MG injection, , Disp: , Rfl:  .  glucagon, human recombinant,  (GLUCAGEN DIAGNOSTIC) 1 MG injection, Use as directed, Disp: , Rfl:  .  glucose 4 GM chewable tablet, Chew 1 tablet by mouth once as needed for low blood sugar., Disp: , Rfl:  .  glucose blood (PRECISION QID TEST) test strip, Use 8 (eight) times daily as directed. BAYER CONTOUR NEXT TEST STRIPS E10.649, Disp: , Rfl:  .  insulin aspart (NOVOLOG) 100 UNIT/ML injection, Basal rate 12am 0.8 units per hour, 9am 0.9 units per hour. (total basal insulin 20.7 units). Carbohydrate ratio 1 units for every 8gm of carbohydrate. Correction factor 1 units for every 50mg /dl over target cbg. Target CBG 80-120, Disp: 1 vial, Rfl: 12 .  nystatin cream (MYCOSTATIN), APPLY TO AFFECTED AREA TWICE A DAY, Disp: 30 g, Rfl: 0 .  pravastatin (PRAVACHOL) 40 MG tablet, Take 40 mg by mouth daily. , Disp: , Rfl:  .  prochlorperazine (COMPAZINE) 10 MG tablet, TAKE 1 TABLET (10 MG TOTAL) BY MOUTH EVERY 6 (SIX) HOURS AS NEEDED (NAUSEA OR VOMITING)., Disp: , Rfl:  .  valACYclovir (VALTREX) 1000 MG tablet, Take 1 tablet (1,000 mg total) by mouth 3 (three) times daily., Disp: 21 tablet, Rfl: 0 .  vitamin B-12 (CYANOCOBALAMIN) 250 MCG tablet, Take 250 mcg by mouth daily., Disp: , Rfl:  .  Vitamin D, Cholecalciferol, 10 MCG (400 UNIT) TABS, Take by mouth., Disp: , Rfl:  No current facility-administered medications for this visit.  Facility-Administered Medications Ordered in Other Visits:  .  heparin lock flush 100 unit/mL, 500 Units, Intravenous, Once, Finnegan, Kathlene November, MD .  sodium chloride flush (NS) 0.9 % injection 10 mL, 10 mL, Intravenous, PRN, Lloyd Huger, MD, 10 mL at 02/01/18 0829 .  sodium chloride flush (NS) 0.9 % injection 10 mL, 10 mL, Intravenous, PRN, Lloyd Huger, MD, 10 mL at 04/05/18 0800  EXAM:  GENERAL: alert.  Sounds to be in no acute distress.  Answering questions appropriately.    PSYCH/NEURO: pleasant and cooperative, no obvious depression or anxiety, speech and thought processing grossly  intact  ASSESSMENT AND PLAN:  Discussed the following assessment and plan:  Rash Describes pain and then notice of rash.  Wife describes rash:  Clusters of blisters - localized to low back and left buttock.  States to her "looks like shingles".  Discussed limitation of telephone visit in determining etiology of rash and pain. Given the pain and description of the rash, does appear to be c/w shingles.  Will treat with valtrex.  Continue tylenol.  Follow.  Will see if we can get him connected with my chart to send picture.  He was unable to do a video visit today.    Diabetes ([place]) Discussed importance of eating regular meals to avoid low sugars.  Follow.  Colon cancer screening He declines colonoscopy at this time.     Meds ordered this encounter  Medications  . valACYclovir (VALTREX) 1000 MG tablet    Sig: Take 1 tablet (1,000 mg total) by mouth 3 (three) times daily.    Dispense:  21 tablet    Refill:  0     I discussed the assessment and treatment plan with the patient. The patient was provided an opportunity to ask questions and all were answered. The patient agreed with the plan and demonstrated an understanding of the instructions.   The patient was advised to call back or seek an in-person evaluation if the symptoms worsen or if the condition fails to improve as anticipated.  I provided 22 minutes of non-face-to-face time during this encounter.   [name], MD

## 2020-07-02 NOTE — Assessment & Plan Note (Signed)
Discussed importance of eating regular meals to avoid low sugars.  Follow.

## 2020-07-02 NOTE — Assessment & Plan Note (Addendum)
Describes pain and then notice of rash.  Wife describes rash:  Clusters of blisters - localized to low back and left buttock.  States to her "looks like shingles".  Discussed limitation of telephone visit in determining etiology of rash and pain. Given the pain and description of the rash, does appear to be c/w shingles.  Will treat with valtrex.  Continue tylenol.  Follow.  Will see if we can get him connected with my chart to send picture.  He was unable to do a video visit today.

## 2020-07-02 NOTE — Assessment & Plan Note (Signed)
He declines colonoscopy at this time.

## 2020-07-03 ENCOUNTER — Ambulatory Visit (INDEPENDENT_AMBULATORY_CARE_PROVIDER_SITE_OTHER): Payer: Medicare Other | Admitting: Nurse Practitioner

## 2020-07-03 ENCOUNTER — Other Ambulatory Visit: Payer: Self-pay

## 2020-07-03 VITALS — BP 142/70 | HR 120 | Ht 70.0 in | Wt 278.0 lb

## 2020-07-03 DIAGNOSIS — E10649 Type 1 diabetes mellitus with hypoglycemia without coma: Secondary | ICD-10-CM | POA: Diagnosis not present

## 2020-07-03 DIAGNOSIS — I152 Hypertension secondary to endocrine disorders: Secondary | ICD-10-CM | POA: Diagnosis not present

## 2020-07-03 DIAGNOSIS — E785 Hyperlipidemia, unspecified: Secondary | ICD-10-CM | POA: Diagnosis not present

## 2020-07-03 DIAGNOSIS — E1159 Type 2 diabetes mellitus with other circulatory complications: Secondary | ICD-10-CM | POA: Diagnosis not present

## 2020-07-03 DIAGNOSIS — L97201 Non-pressure chronic ulcer of unspecified calf limited to breakdown of skin: Secondary | ICD-10-CM | POA: Diagnosis not present

## 2020-07-03 DIAGNOSIS — E1069 Type 1 diabetes mellitus with other specified complication: Secondary | ICD-10-CM | POA: Diagnosis not present

## 2020-07-03 NOTE — Progress Notes (Signed)
History of Present Illness  There is no documented history at this time  Assessments & Plan   There are no diagnoses linked to this encounter.    Additional instructions  Subjective:  Patient presents with venous ulcer of the Bilateral lower extremity.    Procedure:  3 layer unna wrap was placed Bilateral lower extremity.   Plan:   Follow up in one week.  

## 2020-07-04 ENCOUNTER — Encounter (INDEPENDENT_AMBULATORY_CARE_PROVIDER_SITE_OTHER): Payer: Self-pay | Admitting: Nurse Practitioner

## 2020-07-05 ENCOUNTER — Other Ambulatory Visit: Payer: Self-pay

## 2020-07-05 ENCOUNTER — Other Ambulatory Visit (INDEPENDENT_AMBULATORY_CARE_PROVIDER_SITE_OTHER): Payer: Medicare Other

## 2020-07-05 ENCOUNTER — Telehealth: Payer: Self-pay | Admitting: Internal Medicine

## 2020-07-05 DIAGNOSIS — E876 Hypokalemia: Secondary | ICD-10-CM | POA: Diagnosis not present

## 2020-07-05 LAB — POTASSIUM: Potassium: 3 mEq/L — ABNORMAL LOW (ref 3.5–5.1)

## 2020-07-05 MED ORDER — POTASSIUM CHLORIDE ER 10 MEQ PO TBCR: EXTENDED_RELEASE_TABLET | ORAL | 0 refills | Status: DC

## 2020-07-05 NOTE — Telephone Encounter (Signed)
Patient was returning call for results 

## 2020-07-10 ENCOUNTER — Encounter: Payer: Self-pay | Admitting: Internal Medicine

## 2020-07-10 ENCOUNTER — Encounter (INDEPENDENT_AMBULATORY_CARE_PROVIDER_SITE_OTHER): Payer: Self-pay | Admitting: Nurse Practitioner

## 2020-07-10 ENCOUNTER — Other Ambulatory Visit: Payer: Self-pay

## 2020-07-10 ENCOUNTER — Ambulatory Visit (INDEPENDENT_AMBULATORY_CARE_PROVIDER_SITE_OTHER): Payer: Medicare Other | Admitting: Nurse Practitioner

## 2020-07-10 VITALS — BP 138/79 | HR 96 | Ht 67.0 in | Wt 267.0 lb

## 2020-07-10 DIAGNOSIS — L97201 Non-pressure chronic ulcer of unspecified calf limited to breakdown of skin: Secondary | ICD-10-CM | POA: Diagnosis not present

## 2020-07-10 NOTE — Progress Notes (Signed)
History of Present Illness  There is no documented history at this time  Assessments & Plan   There are no diagnoses linked to this encounter.    Additional instructions  Subjective:  Patient presents with venous ulcer of the Bilateral lower extremity.    Procedure:  3 layer unna wrap was placed Bilateral lower extremity.   Plan:   Follow up in one week.  

## 2020-07-12 NOTE — Telephone Encounter (Signed)
Spoke with patient. He got his flu shot almost 2 weeks ago. He started feeling bad this past Monday. Denies any SOB. Just says that he is overall not feeling well. Advised that given his symptoms and it being the weekend he should go to urgent care to be evaluated. He is going to follow up with [place] next week.

## 2020-07-14 DIAGNOSIS — B028 Zoster with other complications: Secondary | ICD-10-CM | POA: Diagnosis not present

## 2020-07-14 DIAGNOSIS — L03312 Cellulitis of back [any part except buttock]: Secondary | ICD-10-CM | POA: Diagnosis not present

## 2020-07-17 ENCOUNTER — Other Ambulatory Visit: Payer: Self-pay | Admitting: Internal Medicine

## 2020-07-17 ENCOUNTER — Other Ambulatory Visit: Payer: Self-pay

## 2020-07-17 ENCOUNTER — Ambulatory Visit (INDEPENDENT_AMBULATORY_CARE_PROVIDER_SITE_OTHER): Payer: Medicare Other | Admitting: Nurse Practitioner

## 2020-07-17 ENCOUNTER — Encounter (INDEPENDENT_AMBULATORY_CARE_PROVIDER_SITE_OTHER): Payer: Self-pay

## 2020-07-17 ENCOUNTER — Telehealth: Payer: Self-pay | Admitting: Internal Medicine

## 2020-07-17 VITALS — BP 112/74 | HR 113 | Resp 16 | Wt 260.4 lb

## 2020-07-17 DIAGNOSIS — L97201 Non-pressure chronic ulcer of unspecified calf limited to breakdown of skin: Secondary | ICD-10-CM | POA: Diagnosis not present

## 2020-07-17 DIAGNOSIS — E876 Hypokalemia: Secondary | ICD-10-CM

## 2020-07-17 NOTE — Telephone Encounter (Signed)
Patient is coming into office for labs on 07/19/20 at 11am. He did go to Urgent care for his shingles. He wanted to know if Dr. Nicki Reaper would like to see if his shingles are drying up or does he need another refill on his shingles medication.

## 2020-07-17 NOTE — Telephone Encounter (Signed)
Patient cannot come into office with shingles. Can do virtual f/u with [name].

## 2020-07-17 NOTE — Telephone Encounter (Signed)
Please change patient's labs to [facility]. He will be going on Friday 07/17/20.

## 2020-07-17 NOTE — Progress Notes (Signed)
History of Present Illness  There is no documented history at this time  Assessments & Plan   There are no diagnoses linked to this encounter.    Additional instructions  Subjective:  Patient presents with venous ulcer of the Bilateral lower extremity.    Procedure:  3 layer unna wrap was placed Bilateral lower extremity.   Plan:   Follow up in one week.  

## 2020-07-19 ENCOUNTER — Other Ambulatory Visit: Payer: 59

## 2020-07-19 ENCOUNTER — Other Ambulatory Visit: Payer: Self-pay | Admitting: Internal Medicine

## 2020-07-19 ENCOUNTER — Other Ambulatory Visit
Admission: RE | Admit: 2020-07-19 | Discharge: 2020-07-19 | Disposition: A | Discharge status: Home / Self Care | Payer: Medicare Other | Source: Ambulatory Visit | Attending: Internal Medicine | Admitting: Internal Medicine

## 2020-07-19 DIAGNOSIS — E876 Hypokalemia: Secondary | ICD-10-CM | POA: Diagnosis not present

## 2020-07-19 LAB — POTASSIUM: Potassium: 3.7 mmol/L (ref 3.5–5.1)

## 2020-07-19 NOTE — Telephone Encounter (Signed)
Labs ordered for [facility] ?

## 2020-07-23 ENCOUNTER — Other Ambulatory Visit: Payer: Self-pay

## 2020-07-23 ENCOUNTER — Encounter: Payer: Self-pay | Admitting: Internal Medicine

## 2020-07-23 ENCOUNTER — Telehealth (INDEPENDENT_AMBULATORY_CARE_PROVIDER_SITE_OTHER): Payer: Medicare Other | Admitting: Internal Medicine

## 2020-07-23 DIAGNOSIS — R0602 Shortness of breath: Secondary | ICD-10-CM | POA: Diagnosis not present

## 2020-07-23 DIAGNOSIS — E1151 Type 2 diabetes mellitus with diabetic peripheral angiopathy without gangrene: Secondary | ICD-10-CM

## 2020-07-23 DIAGNOSIS — N1832 Chronic kidney disease, stage 3b: Secondary | ICD-10-CM

## 2020-07-23 DIAGNOSIS — I495 Sick sinus syndrome: Secondary | ICD-10-CM | POA: Diagnosis not present

## 2020-07-23 DIAGNOSIS — B029 Zoster without complications: Secondary | ICD-10-CM

## 2020-07-23 DIAGNOSIS — I10 Essential (primary) hypertension: Secondary | ICD-10-CM | POA: Diagnosis not present

## 2020-07-23 DIAGNOSIS — E7800 Pure hypercholesterolemia, unspecified: Secondary | ICD-10-CM

## 2020-07-23 DIAGNOSIS — I890 Lymphedema, not elsewhere classified: Secondary | ICD-10-CM | POA: Diagnosis not present

## 2020-07-23 DIAGNOSIS — L03119 Cellulitis of unspecified part of limb: Secondary | ICD-10-CM | POA: Diagnosis not present

## 2020-07-23 DIAGNOSIS — I5022 Chronic systolic (congestive) heart failure: Secondary | ICD-10-CM

## 2020-07-23 DIAGNOSIS — I2510 Atherosclerotic heart disease of native coronary artery without angina pectoris: Secondary | ICD-10-CM

## 2020-07-23 NOTE — Progress Notes (Signed)
[name], male   DOB: 1946/05/15, 74 y.o.   MRN: [id number]   Virtual Visit via video Note  This visit type was conducted due to national recommendations for restrictions regarding the COVID-19 pandemic (e.g. social distancing).  This format is felt to be most appropriate for this patient at this time.  All issues noted in this document were discussed and addressed.  No physical exam was performed (except for noted visual exam findings with Video Visits).   I connected with Gary [name] by a video enabled telemedicine application and verified that I am speaking with the correct person using two identifiers. Location patient: home Location provider: work Persons participating in the virtual visit: patient, provider and pt wife  The limitations, risks, security and privacy concerns of performing an evaluation and management service by video and the availability of in person appointments have been discussed.  It has also been discussed with the patient that there may be a patient responsible charge related to this service. The patient expressed understanding and agreed to proceed.   Reason for visit: work in appt  HPI: Was recently evaluated in acute care for left hip/back pain.  Diagnosed with shingles and cellulitis.  Placed on gabapentin and keflex.  Here to f/u regarding acute care visit.  Lesions - scabbed.  No fever.  Breathing overall stable.  Some decreased appetite.  Is eating.  No vomiting.  Bowels moving.  Takes gabapentin at night.  Sleeping better.  Helping pain.     ROS: See pertinent positives and negatives per HPI.  Past Medical History:  Diagnosis Date  . CHF (congestive heart failure) ([place])   . Depression   . 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 Gastroparesis   . Graves disease   . Hypercholesterolemia   . Hypertension   . Mantle cell lymphoma ([place]) 07/2014  . Peripheral neuropathy     Past  Surgical History:  Procedure Laterality Date  . CHOLECYSTECTOMY    . NEPHRECTOMY     right  . PACEMAKER INSERTION N/A 12/07/2017   Procedure: INSERTION PACEMAKER DUAL CHAMBER INITIAL INSERT;  Surgeon: Isaias Cowman, MD;  Location: [facility];  Service: Cardiovascular;  Laterality: N/A;  . PORTA CATH INSERTION N/A 11/03/2017   Procedure: PORTA CATH INSERTION;  Surgeon: Katha Cabal, MD;  Location: [place] [facility];  Service: Cardiovascular;  Laterality: N/A;    Family History  Problem Relation Age of Onset  . Breast cancer Mother   . Diabetes Father   . Cancer Father        Lymphoma  . Alcohol abuse Maternal Uncle   . Diabetes Sister   . Heart disease Other        grandfather    SOCIAL HX: reviewed.    Current Outpatient Medications:  .  ACCU-CHEK AVIVA PLUS test strip, USE AS INSTRUCTED. USE 1 STRIP EVERY 3 HOURS . ACCU-CHECK AVIVA PLUS TEST STRIP. DX E10.649, Disp: , Rfl: 12 .  acetaminophen (TYLENOL) 500 MG tablet, Take 500 mg by mouth every 6 (six) hours as needed. , Disp: , Rfl:  .  carvedilol (COREG) 3.125 MG tablet, Take 3.125 mg by mouth 2 (two) times daily with a meal. , Disp: , Rfl:  .  carvedilol (COREG) 3.125 MG tablet, Take 1 tablet by mouth 2 (two) times daily with a meal., Disp: , Rfl:  .  cephALEXin (KEFLEX) 500 MG capsule, Take 500 mg by mouth 2 (  two) times daily., Disp: , Rfl:  .  cyanocobalamin (,VITAMIN B-12,) 1000 MCG/ML injection, Inject into the muscle., Disp: , Rfl:  .  ferrous sulfate 325 (65 FE) MG tablet, Take 325 mg by mouth daily., Disp: , Rfl:  .  furosemide (LASIX) 20 MG tablet, Take 40 mg by mouth 2 (two) times daily. 2 tablets in the morning 1 tablet in the afternoon as needed, Disp: , Rfl:  .  GLUCAGON EMERGENCY 1 MG injection, , Disp: , Rfl:  .  glucagon, human recombinant, (GLUCAGEN DIAGNOSTIC) 1 MG injection, Use as directed, Disp: , Rfl:  .  glucose 4 GM chewable tablet, Chew 1 tablet by mouth once as needed for low blood sugar.,  Disp: , Rfl:  .  glucose blood (PRECISION QID TEST) test strip, Use 8 (eight) times daily as directed. BAYER CONTOUR NEXT TEST STRIPS E10.649, Disp: , Rfl:  .  insulin aspart (NOVOLOG) 100 UNIT/ML injection, Basal rate 12am 0.8 units per hour, 9am 0.9 units per hour. (total basal insulin 20.7 units). Carbohydrate ratio 1 units for every 8gm of carbohydrate. Correction factor 1 units for every 50mg /dl over target cbg. Target CBG 80-120, Disp: 1 vial, Rfl: 12 .  nystatin cream (MYCOSTATIN), APPLY TO AFFECTED AREA TWICE A DAY, Disp: 30 g, Rfl: 0 .  potassium chloride (KLOR-CON) 10 MEQ tablet, TAKE 1 TABLET BY MOUTH TWICE DAILY FOR 3 DAYS THEN CONTINUE ONCE DAILY., Disp: 30 tablet, Rfl: 0 .  pravastatin (PRAVACHOL) 40 MG tablet, Take 40 mg by mouth daily. , Disp: , Rfl:  .  prochlorperazine (COMPAZINE) 10 MG tablet, TAKE 1 TABLET (10 MG TOTAL) BY MOUTH EVERY 6 (SIX) HOURS AS NEEDED (NAUSEA OR VOMITING)., Disp: , Rfl:  .  valACYclovir (VALTREX) 1000 MG tablet, Take 1 tablet (1,000 mg total) by mouth 3 (three) times daily., Disp: 21 tablet, Rfl: 0 .  vitamin B-12 (CYANOCOBALAMIN) 250 MCG tablet, Take 250 mcg by mouth daily., Disp: , Rfl:  .  Vitamin D, Cholecalciferol, 10 MCG (400 UNIT) TABS, Take by mouth., Disp: , Rfl:  No current facility-administered medications for this visit.  Facility-Administered Medications Ordered in Other Visits:  .  heparin lock flush 100 unit/mL, 500 Units, Intravenous, Once, Finnegan, Kathlene November, MD .  sodium chloride flush (NS) 0.9 % injection 10 mL, 10 mL, Intravenous, PRN, Lloyd Huger, MD, 10 mL at 02/01/18 0829 .  sodium chloride flush (NS) 0.9 % injection 10 mL, 10 mL, Intravenous, PRN, [name], Kathlene November, MD, 10 mL at 04/05/18 0800  EXAM:  GENERAL: alert, oriented, appears well and in no acute distress  HEENT: atraumatic, conjunttiva clear, no obvious abnormalities on inspection of external nose and ears  NECK: normal movements of the head and  neck  LUNGS: on inspection no signs of respiratory distress, breathing rate appears normal, no obvious gross SOB, gasping or wheezing  CV: no obvious cyanosis  PSYCH/NEURO: pleasant and cooperative, no obvious depression or anxiety, speech and thought processing grossly intact  ASSESSMENT AND PLAN:  Discussed the following assessment and plan:  Problem List Items Addressed This Visit    SOB (shortness of breath)    Breathing stable.  On lasix.        Sick sinus syndrome [facility])    S/p pacemaker placement.       Shingles    Diagnosed recently.  Lesions healing.  On gabapentin - taking at night.  Sleeping well.  Discussed shingles and course.  Follow.       Lymphedema  Followed by [facility].  Has f/u scheduled tomorrow.       Hypercholesterolemia    On pravastatin.  Low cholesterol diet and exercise.  Follow lipid panel and liver function tests.        Essential hypertension, benign    Blood pressure has been doing well. Continue coreg and lasix.  Follow pressures.  Follow metabolic panel.       Diabetes [facility])    Discussed importance of eating regular meals.  Follow sugars.  Followed by endocrinology.        CKD (chronic kidney disease) stage 3, GFR 30-59 ml/min (HCC)    Followed by neprhology.       Chronic systolic CHF (congestive heart failure) ([facility])    Breathing stable. On lasix. Followed by cardiology.       Cellulitis    Recently diagnosed with cellulitis. Wife able to move camera so I could visualize the lesions.  Discussed limitations of virtual visit.  Cellulitis appears to be resolved.  Lesions - scabbed.  Complete course of keflex. Hold on further abx.  Follow.  Notify me if changes.            I discussed the assessment and treatment plan with the patient. The patient was provided an opportunity to ask questions and all were answered. The patient agreed with the plan and demonstrated an understanding of the instructions.   The patient was advised to call  back or seek an in-person evaluation if the symptoms worsen or if the condition fails to improve as anticipated.   [name], MD

## 2020-07-24 ENCOUNTER — Encounter (INDEPENDENT_AMBULATORY_CARE_PROVIDER_SITE_OTHER): Payer: Self-pay | Admitting: Nurse Practitioner

## 2020-07-24 ENCOUNTER — Ambulatory Visit (INDEPENDENT_AMBULATORY_CARE_PROVIDER_SITE_OTHER): Payer: Medicare Other | Admitting: Nurse Practitioner

## 2020-07-24 ENCOUNTER — Encounter: Payer: Self-pay | Admitting: Internal Medicine

## 2020-07-24 VITALS — BP 137/79 | HR 93 | Ht 70.0 in | Wt 257.0 lb

## 2020-07-24 DIAGNOSIS — I10 Essential (primary) hypertension: Secondary | ICD-10-CM

## 2020-07-24 DIAGNOSIS — L03119 Cellulitis of unspecified part of limb: Secondary | ICD-10-CM | POA: Diagnosis not present

## 2020-07-24 DIAGNOSIS — L97201 Non-pressure chronic ulcer of unspecified calf limited to breakdown of skin: Secondary | ICD-10-CM | POA: Diagnosis not present

## 2020-07-24 DIAGNOSIS — I890 Lymphedema, not elsewhere classified: Secondary | ICD-10-CM | POA: Diagnosis not present

## 2020-07-24 DIAGNOSIS — B029 Zoster without complications: Secondary | ICD-10-CM | POA: Insufficient documentation

## 2020-07-24 NOTE — Assessment & Plan Note (Signed)
Blood pressure has been doing well. Continue coreg and lasix.  Follow pressures.  Follow metabolic panel.

## 2020-07-24 NOTE — Assessment & Plan Note (Signed)
Recently diagnosed with cellulitis. Wife able to move camera so I could visualize the lesions.  Discussed limitations of virtual visit.  Cellulitis appears to be resolved.  Lesions - scabbed.  Complete course of keflex. Hold on further abx.  Follow.  Notify me if changes.

## 2020-07-24 NOTE — Assessment & Plan Note (Signed)
Breathing stable.  On lasix.

## 2020-07-24 NOTE — Assessment & Plan Note (Signed)
On pravastatin.  Low cholesterol diet and exercise.  Follow lipid panel and liver function tests.   

## 2020-07-24 NOTE — Assessment & Plan Note (Signed)
Discussed importance of eating regular meals.  Follow sugars.  Followed by endocrinology.

## 2020-07-24 NOTE — Assessment & Plan Note (Signed)
Diagnosed recently.  Lesions healing.  On gabapentin - taking at night.  Sleeping well.  Discussed shingles and course.  Follow.

## 2020-07-24 NOTE — Assessment & Plan Note (Signed)
S/p pacemaker placement.

## 2020-07-24 NOTE — Assessment & Plan Note (Signed)
Followed by [facility].  Has f/u scheduled tomorrow.

## 2020-07-24 NOTE — Assessment & Plan Note (Signed)
Followed by neprhology.

## 2020-07-24 NOTE — Assessment & Plan Note (Signed)
Breathing stable. On lasix. Followed by cardiology.

## 2020-07-26 NOTE — Progress Notes (Signed)
[place]  [phone] [phone]) [phone]  ID: Gary [name] OB: March 13, 1946  MR#: [id number]  WYO#:[id number]  Patient Care Team: [name], MD as PCP - General (Internal Medicine) Lloyd Huger, MD as Consulting Physician (Oncology) Lucky Cowboy Erskine Squibb, MD as Referring Physician (Vascular Surgery) Schnier, Dolores Lory, MD (Vascular Surgery) Corey Skains, MD as Consulting Physician (Cardiology)  CHIEF COMPLAINT: Mantle cell lymphoma.  INTERVAL HISTORY: Patient returns to clinic today for routine evaluation and discussion of his imaging results.  He reports a poor appetite secondary to food not tasting well and some weight loss, but otherwise feels well. He has chronic weakness and fatigue. He denies any fevers, chills, or night sweats.  He has no neurologic complaints.  He denies any chest pain, shortness of breath, cough, or hemoptysis.  He has no nausea, vomiting, constipation, or diarrhea.  He denies any melena or hematochezia.  He has no urinary complaints.  Patient offers no further specific complaints today.  Review of Systems  Constitutional: Positive for malaise/fatigue and weight loss. Negative for diaphoresis and fever.  Respiratory: Negative.  Negative for cough, hemoptysis and shortness of breath.   Cardiovascular: Negative.  Negative for chest pain and leg swelling.  Gastrointestinal: Negative.  Negative for abdominal pain.  Genitourinary: Negative.  Negative for dysuria.  Musculoskeletal: Negative.  Negative for back pain.  Skin: Negative.  Negative for rash.  Neurological: Positive for weakness. Negative for dizziness, focal weakness and headaches.  Psychiatric/Behavioral: Negative.  The patient is not nervous/anxious.    As per HPI. Otherwise, a complete review of systems is negative.  PAST MEDICAL HISTORY: Past Medical History:  Diagnosis Date  . CHF (congestive heart failure) ([place])   . Depression   . Diabetes mellitus due to  underlying condition with diabetic retinopathy with macular edema   . Gastroparesis   . Graves disease   . Hypercholesterolemia   . Hypertension   . Mantle cell lymphoma ([place]) 07/2014  . Peripheral neuropathy   . Shingles     PAST SURGICAL HISTORY: Past Surgical History:  Procedure Laterality Date  . CHOLECYSTECTOMY    . NEPHRECTOMY     right  . PACEMAKER INSERTION N/A 12/07/2017   Procedure: INSERTION PACEMAKER DUAL CHAMBER INITIAL INSERT;  Surgeon: Isaias Cowman, MD;  Location: [facility] ORS;  Service: Cardiovascular;  Laterality: N/A;  . PORTA CATH INSERTION N/A 11/03/2017   Procedure: PORTA CATH INSERTION;  Surgeon: Katha Cabal, MD;  Location: [place] [facility];  Service: Cardiovascular;  Laterality: N/A;    FAMILY HISTORY Family History  Problem Relation Age of Onset  . Breast cancer Mother   . Diabetes Father   . Cancer Father        Lymphoma  . Alcohol abuse Maternal Uncle   . Diabetes Sister   . Heart disease Other        grandfather       ADVANCED DIRECTIVES:    HEALTH MAINTENANCE: Social History   Tobacco Use  . Smoking status: Former Research scientist (life sciences)  . Smokeless tobacco: Current User    Types: Chew  Vaping Use  . Vaping Use: Never used  Substance Use Topics  . Alcohol use: Yes    Alcohol/week: 3.0 standard drinks    Types: 3 Cans of beer per week    Comment: nightly  . Drug use: Never     Colonoscopy:  PAP:  Bone density:  Lipid panel:  Allergies  Allergen Reactions  . Rofecoxib Nausea Only  Current Outpatient Medications  Medication Sig Dispense Refill  . ACCU-CHEK AVIVA PLUS test strip USE AS INSTRUCTED. USE 1 STRIP EVERY 3 HOURS . ACCU-CHECK AVIVA PLUS TEST STRIP. DX E10.649  12  . acetaminophen (TYLENOL) 500 MG tablet Take 500 mg by mouth every 6 (six) hours as needed.     . carvedilol (COREG) 3.125 MG tablet Take 3.125 mg by mouth 2 (two) times daily with a meal.     . carvedilol (COREG) 3.125 MG tablet Take 1 tablet by mouth 2  (two) times daily with a meal.    . cephALEXin (KEFLEX) 500 MG capsule Take 500 mg by mouth 2 (two) times daily.    . cyanocobalamin (,VITAMIN B-12,) 1000 MCG/ML injection Inject into the muscle.    . ferrous sulfate 325 (65 FE) MG tablet Take 325 mg by mouth daily.    . furosemide (LASIX) 20 MG tablet Take 40 mg by mouth 2 (two) times daily. 2 tablets in the morning 1 tablet in the afternoon as needed    . gabapentin (NEURONTIN) 100 MG capsule Take by mouth.    Marland Kitchen GLUCAGON EMERGENCY 1 MG injection     . glucagon, human recombinant, (GLUCAGEN DIAGNOSTIC) 1 MG injection Use as directed    . glucose 4 GM chewable tablet Chew 1 tablet by mouth once as needed for low blood sugar.    Marland Kitchen glucose blood (PRECISION QID TEST) test strip Use 8 (eight) times daily as directed. BAYER CONTOUR NEXT TEST STRIPS E10.649    . insulin aspart (NOVOLOG) 100 UNIT/ML injection Basal rate 12am 0.8 units per hour, 9am 0.9 units per hour. (total basal insulin 20.7 units). Carbohydrate ratio 1 units for every 8gm of carbohydrate. Correction factor 1 units for every 50mg /dl over target cbg. Target CBG 80-120 1 vial 12  . levothyroxine (SYNTHROID) 25 MCG tablet PLEASE SEE ATTACHED FOR DETAILED DIRECTIONS    . lisinopril (ZESTRIL) 20 MG tablet Take by mouth.    . nystatin cream (MYCOSTATIN) APPLY TO AFFECTED AREA TWICE A DAY 30 g 0  . potassium chloride (KLOR-CON) 10 MEQ tablet TAKE 1 TABLET BY MOUTH TWICE DAILY FOR 3 DAYS THEN CONTINUE ONCE DAILY. 30 tablet 0  . pravastatin (PRAVACHOL) 40 MG tablet Take 40 mg by mouth daily.     . prochlorperazine (COMPAZINE) 10 MG tablet TAKE 1 TABLET (10 MG TOTAL) BY MOUTH EVERY 6 (SIX) HOURS AS NEEDED (NAUSEA OR VOMITING).    Marland Kitchen spironolactone (ALDACTONE) 25 MG tablet Take by mouth.    . valACYclovir (VALTREX) 1000 MG tablet Take 1 tablet (1,000 mg total) by mouth 3 (three) times daily. 21 tablet 0  . vitamin B-12 (CYANOCOBALAMIN) 250 MCG tablet Take 250 mcg by mouth daily.    . Vitamin D,  Cholecalciferol, 10 MCG (400 UNIT) TABS Take by mouth.     No current facility-administered medications for this visit.   Facility-Administered Medications Ordered in Other Visits  Medication Dose Route Frequency Provider Last Rate Last Admin  . heparin lock flush 100 unit/mL  500 Units Intravenous Once Lloyd Huger, MD      . sodium chloride flush (NS) 0.9 % injection 10 mL  10 mL Intravenous PRN Lloyd Huger, MD   10 mL at 02/01/18 0829  . sodium chloride flush (NS) 0.9 % injection 10 mL  10 mL Intravenous PRN Lloyd Huger, MD   10 mL at 04/05/18 0800    OBJECTIVE: There were no vitals filed for this visit.  There is no height or weight on file to calculate BMI.    ECOG FS:0 - Asymptomatic  General: Well-developed, well-nourished, no acute distress. Eyes: Pink conjunctiva, anicteric sclera. HEENT: Normocephalic, moist mucous membranes. Lungs: No audible wheezing or coughing. Heart: Regular rate and rhythm. Abdomen: Soft, nontender, no obvious distention. Musculoskeletal: No edema, cyanosis, or clubbing. Neuro: Alert, answering all questions appropriately. Cranial nerves grossly intact. Skin: No rashes or petechiae noted. Psych: Normal affect.   LAB RESULTS:  Lab Results  Component Value Date   NA 132 (L) 07/29/2020   K 4.2 07/29/2020   CL 98 07/29/2020   CO2 26 07/29/2020   GLUCOSE 151 (H) 07/29/2020   BUN 26 (H) 07/29/2020   CREATININE 2.01 (H) 07/29/2020   CALCIUM 8.1 (L) 07/29/2020   PROT 7.1 07/29/2020   ALBUMIN 3.4 (L) 07/29/2020   AST 24 07/29/2020   ALT 16 07/29/2020   ALKPHOS 115 07/29/2020   BILITOT 1.1 07/29/2020   GFRNONAA 32 (L) 07/29/2020   GFRAA 41 (L) 01/24/2020    Lab Results  Component Value Date   WBC 6.1 07/29/2020   NEUTROABS 4.0 07/29/2020   HGB 13.9 07/29/2020   HCT 40.0 07/29/2020   MCV 90.9 07/29/2020   PLT 102 (L) 07/29/2020   Lab Results  Component Value Date   IRON 13 (L) 08/11/2017   TIBC 223 (L)  01/12/2017   IRONPCTSAT 3.8 (L) 08/11/2017   Lab Results  Component Value Date   FERRITIN 42.0 08/11/2017     STUDIES:   ASSESSMENT: Low-grade mantle cell lymphoma diagnosed in September 2015, iron deficiency anemia.  PLAN:    1. Mantle cell lymphoma, stage III: Patient's initial diagnosis was on inguinal lymph node biopsy on July 12, 2014.  Patient was noted to have progressive disease and underwent chemotherapy using Rituxan and Treanda completing cycle 4 on Mar 09, 2018.  His most recent CT scan on July 29, 2020 reviewed independently revealing no evidence of recurrent or progressive disease.  No intervention is needed.  Continue imaging every 6 months until patient is 3 years removed from treatment and then transition to yearly imaging.  Return to clinic in 6 months with repeat CT scans and further evaluation. 2. Decreased ejection fraction: Chronic and unchanged.  Previously patient had an ejection fraction of 40% with no clinical CHF.  Continue monitoring and evaluation per cardiology. 3. Diabetes: Patient has improved blood glucose control. Continue insulin pump as directed. 4. Thrombocytopenia: Platelet count slightly decreased and is now 102.  Monitor. 5.  Bradycardia: Patient has pacemaker in place. 6.  Renal insufficiency: Patient's creatinine has trended up to 2.0.  Continue follow-up with nephrology as scheduled. 6.  Weight loss: Patient was offered a referral to dietary, but declined at this time.  CT scan results as above.  Patient expressed understanding and was in agreement with this plan. He also understands that He can call clinic at any time with any questions, concerns, or complaints.    Lloyd Huger, MD   08/01/2020 3:29 PM

## 2020-07-29 ENCOUNTER — Other Ambulatory Visit: Payer: Self-pay | Admitting: *Deleted

## 2020-07-29 ENCOUNTER — Ambulatory Visit
Admission: RE | Admit: 2020-07-29 | Discharge: 2020-07-29 | Disposition: A | Discharge status: Home / Self Care | Payer: Medicare Other | Source: Ambulatory Visit | Attending: Oncology | Admitting: Oncology

## 2020-07-29 ENCOUNTER — Other Ambulatory Visit: Payer: Self-pay | Admitting: Oncology

## 2020-07-29 ENCOUNTER — Inpatient Hospital Stay: Payer: Medicare Other | Attending: Oncology

## 2020-07-29 ENCOUNTER — Other Ambulatory Visit: Payer: Self-pay

## 2020-07-29 ENCOUNTER — Encounter (INDEPENDENT_AMBULATORY_CARE_PROVIDER_SITE_OTHER): Payer: Self-pay | Admitting: Nurse Practitioner

## 2020-07-29 DIAGNOSIS — R630 Anorexia: Secondary | ICD-10-CM | POA: Insufficient documentation

## 2020-07-29 DIAGNOSIS — C8318 Mantle cell lymphoma, lymph nodes of multiple sites: Secondary | ICD-10-CM

## 2020-07-29 DIAGNOSIS — R5381 Other malaise: Secondary | ICD-10-CM | POA: Insufficient documentation

## 2020-07-29 DIAGNOSIS — Z79899 Other long term (current) drug therapy: Secondary | ICD-10-CM | POA: Insufficient documentation

## 2020-07-29 DIAGNOSIS — Z87891 Personal history of nicotine dependence: Secondary | ICD-10-CM | POA: Insufficient documentation

## 2020-07-29 DIAGNOSIS — E1143 Type 2 diabetes mellitus with diabetic autonomic (poly)neuropathy: Secondary | ICD-10-CM | POA: Insufficient documentation

## 2020-07-29 DIAGNOSIS — D509 Iron deficiency anemia, unspecified: Secondary | ICD-10-CM | POA: Insufficient documentation

## 2020-07-29 DIAGNOSIS — N289 Disorder of kidney and ureter, unspecified: Secondary | ICD-10-CM | POA: Diagnosis not present

## 2020-07-29 DIAGNOSIS — E11311 Type 2 diabetes mellitus with unspecified diabetic retinopathy with macular edema: Secondary | ICD-10-CM | POA: Diagnosis not present

## 2020-07-29 DIAGNOSIS — I110 Hypertensive heart disease with heart failure: Secondary | ICD-10-CM | POA: Diagnosis not present

## 2020-07-29 DIAGNOSIS — D696 Thrombocytopenia, unspecified: Secondary | ICD-10-CM | POA: Diagnosis not present

## 2020-07-29 DIAGNOSIS — R531 Weakness: Secondary | ICD-10-CM | POA: Diagnosis not present

## 2020-07-29 DIAGNOSIS — R634 Abnormal weight loss: Secondary | ICD-10-CM | POA: Insufficient documentation

## 2020-07-29 DIAGNOSIS — E7800 Pure hypercholesterolemia, unspecified: Secondary | ICD-10-CM | POA: Insufficient documentation

## 2020-07-29 DIAGNOSIS — I509 Heart failure, unspecified: Secondary | ICD-10-CM | POA: Diagnosis not present

## 2020-07-29 DIAGNOSIS — R5382 Chronic fatigue, unspecified: Secondary | ICD-10-CM | POA: Insufficient documentation

## 2020-07-29 DIAGNOSIS — Z950 Presence of cardiac pacemaker: Secondary | ICD-10-CM | POA: Insufficient documentation

## 2020-07-29 DIAGNOSIS — Z95828 Presence of other vascular implants and grafts: Secondary | ICD-10-CM

## 2020-07-29 LAB — CBC WITH DIFFERENTIAL/PLATELET
Abs Immature Granulocytes: 0.04 10*3/uL (ref 0.00–0.07)
Basophils Absolute: 0.1 10*3/uL (ref 0.0–0.1)
Basophils Relative: 1 %
Eosinophils Absolute: 0.1 10*3/uL (ref 0.0–0.5)
Eosinophils Relative: 2 %
HCT: 40 % (ref 39.0–52.0)
Hemoglobin: 13.9 g/dL (ref 13.0–17.0)
Immature Granulocytes: 1 %
Lymphocytes Relative: 21 %
Lymphs Abs: 1.3 10*3/uL (ref 0.7–4.0)
MCH: 31.6 pg (ref 26.0–34.0)
MCHC: 34.8 g/dL (ref 30.0–36.0)
MCV: 90.9 fL (ref 80.0–100.0)
Monocytes Absolute: 0.7 10*3/uL (ref 0.1–1.0)
Monocytes Relative: 11 %
Neutro Abs: 4 10*3/uL (ref 1.7–7.7)
Neutrophils Relative %: 64 %
Platelets: 102 10*3/uL — ABNORMAL LOW (ref 150–400)
RBC: 4.4 MIL/uL (ref 4.22–5.81)
RDW: 14 % (ref 11.5–15.5)
WBC: 6.1 10*3/uL (ref 4.0–10.5)
nRBC: 0 % (ref 0.0–0.2)

## 2020-07-29 LAB — COMPREHENSIVE METABOLIC PANEL
ALT: 16 U/L (ref 0–44)
AST: 24 U/L (ref 15–41)
Albumin: 3.4 g/dL — ABNORMAL LOW (ref 3.5–5.0)
Alkaline Phosphatase: 115 U/L (ref 38–126)
Anion gap: 8 (ref 5–15)
BUN: 26 mg/dL — ABNORMAL HIGH (ref 8–23)
CO2: 26 mmol/L (ref 22–32)
Calcium: 8.1 mg/dL — ABNORMAL LOW (ref 8.9–10.3)
Chloride: 98 mmol/L (ref 98–111)
Creatinine, Ser: 2.01 mg/dL — ABNORMAL HIGH (ref 0.61–1.24)
GFR, Estimated: 32 mL/min — ABNORMAL LOW (ref >60)
Glucose, Bld: 151 mg/dL — ABNORMAL HIGH (ref 70–99)
Potassium: 4.2 mmol/L (ref 3.5–5.1)
Sodium: 132 mmol/L — ABNORMAL LOW (ref 135–145)
Total Bilirubin: 1.1 mg/dL (ref 0.3–1.2)
Total Protein: 7.1 g/dL (ref 6.5–8.1)

## 2020-07-29 MED ORDER — SODIUM CHLORIDE 0.9% FLUSH
10.0000 mL | INTRAVENOUS | Status: DC | PRN
  Administered 2020-07-29: 10 mL via INTRAVENOUS
  Filled 2020-07-29: qty 10

## 2020-07-29 MED ORDER — HEPARIN SOD (PORK) LOCK FLUSH 100 UNIT/ML IV SOLN
500.0000 [IU] | Freq: Once | INTRAVENOUS | Status: AC
  Administered 2020-07-29: 500 [IU] via INTRAVENOUS
  Filled 2020-07-29: qty 5

## 2020-07-29 MED ORDER — HEPARIN SOD (PORK) LOCK FLUSH 100 UNIT/ML IV SOLN
INTRAVENOUS | Status: AC
  Filled 2020-07-29: qty 5

## 2020-07-29 NOTE — Progress Notes (Signed)
Subjective:    [name], male    DOB: Sep 16, 1946, 74 y.o.   MRN: [id number] Chief Complaint  Patient presents with  . Follow-up    5 wk bil unna boot check    The patient is following up today for evaluation of his lower extremity edema and ulcerations.  The patient notes that he recently had his diuretics changed by his cardiologist and he has lost a lot of fluid.  The patient's legs look remarkably well today.  The patient denies any significant weeping such as before.  He does not endorse significant pain such as he has before with cellulitis.  He denies any fever, chills, nausea, vomiting or diarrhea.  He denies any extensive change in his fluid status or worsening shortness of breath.   Review of Systems  Cardiovascular: Positive for leg swelling.  Skin: Positive for color change and wound.  All other systems reviewed and are negative.      Objective:   Physical Exam Vitals reviewed.  HENT:     Head: Normocephalic.  Cardiovascular:     Rate and Rhythm: Normal rate and regular rhythm.     Pulses: Normal pulses.  Pulmonary:     Effort: Pulmonary effort is normal.     Breath sounds: Normal breath sounds.  Skin:    General: Skin is warm and dry.     Capillary Refill: Capillary refill takes more than 3 seconds.  Neurological:     Mental Status: He is alert and oriented to person, place, and time.  Psychiatric:        Mood and Affect: Mood normal.        Behavior: Behavior normal.        Thought Content: Thought content normal.        Judgment: Judgment normal.     BP 137/79   Pulse 93   Ht 5\' 10"  (1.778 m)   Wt 257 lb (116.6 kg)   BMI 36.88 kg/m   Past Medical History:  Diagnosis Date  . CHF (congestive heart failure) ([place])   . Depression   . 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 Gastroparesis   . Graves disease   . Hypercholesterolemia   . Hypertension   . Mantle cell lymphoma ([place])  07/2014  . Peripheral neuropathy   . Shingles     Social History   Socioeconomic History  . Marital status: Married    Spouse name: Not on file  . Number of children: 1  . Years of education: Not on file  . Highest education level: Not on file  Occupational History  . Not on file  Tobacco Use  . Smoking status: Former Research scientist (life sciences)  . Smokeless tobacco: Current User    Types: Chew  Vaping Use  . Vaping Use: Never used  Substance and Sexual Activity  . Alcohol use: Yes    Alcohol/week: 3.0 standard drinks    Types: 3 Cans of beer per week    Comment: nightly  . Drug use: Never  . Sexual activity: Not Currently  Other Topics Concern  . Not on file  Social History Narrative  . Not on file   Social Determinants of Health   Financial Resource Strain: Unknown  . Difficulty of Paying Living Expenses: Patient refused  Food Insecurity: Unknown  . Worried About Charity fundraiser in the Last Year: Patient refused  . Ran Out of Food in the  Last Year: Patient refused  Transportation Needs: No Transportation Needs  . Lack of Transportation (Medical): No  . Lack of Transportation (Non-Medical): No  Physical Activity: Inactive  . Days of Exercise per Week: 0 days  . Minutes of Exercise per Session: 0 min  Stress: No Stress Concern Present  . Feeling of Stress : Not at all  Social Connections: Unknown  . Frequency of Communication with Friends and Family: Patient refused  . Frequency of Social Gatherings with Friends and Family: Patient refused  . Attends Religious Services: Patient refused  . Active Member of Clubs or Organizations: Patient refused  . Attends Archivist Meetings: Patient refused  . Marital Status: Patient refused  Intimate Partner Violence: Unknown  . Fear of Current or Ex-Partner: Patient refused  . Emotionally Abused: Patient refused  . Physically Abused: Patient refused  . Sexually Abused: Patient refused    Past Surgical History:  Procedure  Laterality Date  . CHOLECYSTECTOMY    . NEPHRECTOMY     right  . PACEMAKER INSERTION N/A 12/07/2017   Procedure: INSERTION PACEMAKER DUAL CHAMBER INITIAL INSERT;  Surgeon: Isaias Cowman, MD;  Location: [facility];  Service: Cardiovascular;  Laterality: N/A;  . PORTA CATH INSERTION N/A 11/03/2017   Procedure: PORTA CATH INSERTION;  Surgeon: Katha Cabal, MD;  Location: [facility];  Service: Cardiovascular;  Laterality: N/A;    Family History  Problem Relation Age of Onset  . Breast cancer Mother   . Diabetes Father   . Cancer Father        Lymphoma  . Alcohol abuse Maternal Uncle   . Diabetes Sister   . Heart disease Other        grandfather    Allergies  Allergen Reactions  . Rofecoxib Nausea Only       Assessment & Plan:   1. Skin ulcer of calf, limited to breakdown of skin, unspecified laterality ([place]) All ulcerations have mostly healed at this time.  The patient still has several small ulcerations however the legs look much better than previously.  Patient will continue with Unna wraps as previously prescribed.  2. Cellulitis of lower extremity, unspecified laterality Completely resolved at this time.  3. Lymphedema Recent diuresis by his cardiologist has resulted in a massive improvement in patient's lower extremity edema.  Patient's legs look much better than they have previously.  Patient will continue to follow with cardiology for diuretic usage.  4. Essential hypertension, benign Continue antihypertensive medications as already ordered, these medications have been reviewed and there are no changes at this time.    Current Outpatient Medications on File Prior to Visit  Medication Sig Dispense Refill  . ACCU-CHEK AVIVA PLUS test strip USE AS INSTRUCTED. USE 1 STRIP EVERY 3 HOURS . ACCU-CHECK AVIVA PLUS TEST STRIP. DX E10.649  12  . acetaminophen (TYLENOL) 500 MG tablet Take 500 mg by mouth every 6 (six) hours as needed.     . carvedilol (COREG)  3.125 MG tablet Take 1 tablet by mouth 2 (two) times daily with a meal.    . cephALEXin (KEFLEX) 500 MG capsule Take 500 mg by mouth 2 (two) times daily.    . cyanocobalamin (,VITAMIN B-12,) 1000 MCG/ML injection Inject into the muscle.    . ferrous sulfate 325 (65 FE) MG tablet Take 325 mg by mouth daily.    . furosemide (LASIX) 20 MG tablet Take 40 mg by mouth 2 (two) times daily. 2 tablets in the morning 1 tablet  in the afternoon as needed    . gabapentin (NEURONTIN) 100 MG capsule Take by mouth.    Marland Kitchen GLUCAGON EMERGENCY 1 MG injection     . glucagon, human recombinant, (GLUCAGEN DIAGNOSTIC) 1 MG injection Use as directed    . glucose 4 GM chewable tablet Chew 1 tablet by mouth once as needed for low blood sugar.    Marland Kitchen glucose blood (PRECISION QID TEST) test strip Use 8 (eight) times daily as directed. BAYER CONTOUR NEXT TEST STRIPS E10.649    . insulin aspart (NOVOLOG) 100 UNIT/ML injection Basal rate 12am 0.8 units per hour, 9am 0.9 units per hour. (total basal insulin 20.7 units). Carbohydrate ratio 1 units for every 8gm of carbohydrate. Correction factor 1 units for every 50mg /dl over target cbg. Target CBG 80-120 1 vial 12  . levothyroxine (SYNTHROID) 25 MCG tablet PLEASE SEE ATTACHED FOR DETAILED DIRECTIONS    . lisinopril (ZESTRIL) 20 MG tablet Take by mouth.    . nystatin cream (MYCOSTATIN) APPLY TO AFFECTED AREA TWICE A DAY 30 g 0  . potassium chloride (KLOR-CON) 10 MEQ tablet TAKE 1 TABLET BY MOUTH TWICE DAILY FOR 3 DAYS THEN CONTINUE ONCE DAILY. 30 tablet 0  . pravastatin (PRAVACHOL) 40 MG tablet Take 40 mg by mouth daily.     . prochlorperazine (COMPAZINE) 10 MG tablet TAKE 1 TABLET (10 MG TOTAL) BY MOUTH EVERY 6 (SIX) HOURS AS NEEDED (NAUSEA OR VOMITING).    Marland Kitchen spironolactone (ALDACTONE) 25 MG tablet Take by mouth.    . valACYclovir (VALTREX) 1000 MG tablet Take 1 tablet (1,000 mg total) by mouth 3 (three) times daily. 21 tablet 0  . vitamin B-12 (CYANOCOBALAMIN) 250 MCG tablet Take  250 mcg by mouth daily.    . Vitamin D, Cholecalciferol, 10 MCG (400 UNIT) TABS Take by mouth.    . carvedilol (COREG) 3.125 MG tablet Take 3.125 mg by mouth 2 (two) times daily with a meal.      Current Facility-Administered Medications on File Prior to Visit  Medication Dose Route Frequency Provider Last Rate Last Admin  . heparin lock flush 100 unit/mL  500 Units Intravenous Once Lloyd Huger, MD      . sodium chloride flush (NS) 0.9 % injection 10 mL  10 mL Intravenous PRN Lloyd Huger, MD   10 mL at 02/01/18 0829  . sodium chloride flush (NS) 0.9 % injection 10 mL  10 mL Intravenous PRN Lloyd Huger, MD   10 mL at 04/05/18 0800    There are no Patient Instructions on file for this visit. No follow-ups on file.   Kris Hartmann, NP

## 2020-07-31 ENCOUNTER — Ambulatory Visit (INDEPENDENT_AMBULATORY_CARE_PROVIDER_SITE_OTHER): Payer: Medicare Other | Admitting: Nurse Practitioner

## 2020-07-31 ENCOUNTER — Other Ambulatory Visit: Payer: Self-pay

## 2020-07-31 ENCOUNTER — Encounter: Payer: Self-pay | Admitting: Oncology

## 2020-07-31 VITALS — BP 133/76 | HR 93 | Resp 18 | Ht 70.0 in | Wt 254.0 lb

## 2020-07-31 DIAGNOSIS — I872 Venous insufficiency (chronic) (peripheral): Secondary | ICD-10-CM | POA: Diagnosis not present

## 2020-07-31 DIAGNOSIS — L97909 Non-pressure chronic ulcer of unspecified part of unspecified lower leg with unspecified severity: Secondary | ICD-10-CM | POA: Diagnosis not present

## 2020-07-31 NOTE — Progress Notes (Signed)
History of Present Illness  There is no documented history at this time  Assessments & Plan   There are no diagnoses linked to this encounter.    Additional instructions  Subjective:  Patient presents with venous ulcer of the Bilateral lower extremity.    Procedure:  3 layer unna wrap was placed Bilateral lower extremity.   Plan:   Follow up in one week.  

## 2020-08-01 ENCOUNTER — Inpatient Hospital Stay (HOSPITAL_BASED_OUTPATIENT_CLINIC_OR_DEPARTMENT_OTHER): Payer: Medicare Other | Admitting: Oncology

## 2020-08-01 DIAGNOSIS — I2510 Atherosclerotic heart disease of native coronary artery without angina pectoris: Secondary | ICD-10-CM

## 2020-08-01 DIAGNOSIS — D509 Iron deficiency anemia, unspecified: Secondary | ICD-10-CM | POA: Diagnosis not present

## 2020-08-01 DIAGNOSIS — D696 Thrombocytopenia, unspecified: Secondary | ICD-10-CM | POA: Diagnosis not present

## 2020-08-01 DIAGNOSIS — C8318 Mantle cell lymphoma, lymph nodes of multiple sites: Secondary | ICD-10-CM | POA: Diagnosis not present

## 2020-08-01 DIAGNOSIS — R5382 Chronic fatigue, unspecified: Secondary | ICD-10-CM | POA: Diagnosis not present

## 2020-08-01 DIAGNOSIS — R531 Weakness: Secondary | ICD-10-CM | POA: Diagnosis not present

## 2020-08-01 DIAGNOSIS — R5381 Other malaise: Secondary | ICD-10-CM | POA: Diagnosis not present

## 2020-08-04 ENCOUNTER — Encounter (INDEPENDENT_AMBULATORY_CARE_PROVIDER_SITE_OTHER): Payer: Self-pay | Admitting: Nurse Practitioner

## 2020-08-06 ENCOUNTER — Other Ambulatory Visit: Payer: Self-pay

## 2020-08-06 ENCOUNTER — Other Ambulatory Visit (INDEPENDENT_AMBULATORY_CARE_PROVIDER_SITE_OTHER): Payer: Medicare Other

## 2020-08-06 DIAGNOSIS — N1832 Chronic kidney disease, stage 3b: Secondary | ICD-10-CM

## 2020-08-06 DIAGNOSIS — E875 Hyperkalemia: Secondary | ICD-10-CM | POA: Diagnosis not present

## 2020-08-06 LAB — BASIC METABOLIC PANEL
BUN: 18 mg/dL (ref 6–23)
CO2: 26 mEq/L (ref 19–32)
Calcium: 8.4 mg/dL (ref 8.4–10.5)
Chloride: 103 mEq/L (ref 96–112)
Creatinine, Ser: 1.78 mg/dL — ABNORMAL HIGH (ref 0.40–1.50)
GFR: 37.75 mL/min — ABNORMAL LOW (ref >60.00)
Glucose, Bld: 85 mg/dL (ref 70–99)
Potassium: 3.9 mEq/L (ref 3.5–5.1)
Sodium: 139 mEq/L (ref 135–145)

## 2020-08-06 NOTE — Addendum Note (Signed)
Addended by: Tor Netters I on: 08/06/2020 11:10 AM   Modules accepted: Orders

## 2020-08-07 ENCOUNTER — Ambulatory Visit (INDEPENDENT_AMBULATORY_CARE_PROVIDER_SITE_OTHER): Payer: Medicare Other | Admitting: Nurse Practitioner

## 2020-08-07 ENCOUNTER — Encounter (INDEPENDENT_AMBULATORY_CARE_PROVIDER_SITE_OTHER): Payer: Self-pay

## 2020-08-07 VITALS — BP 107/69 | HR 98 | Resp 16 | Wt 255.0 lb

## 2020-08-07 DIAGNOSIS — L97909 Non-pressure chronic ulcer of unspecified part of unspecified lower leg with unspecified severity: Secondary | ICD-10-CM | POA: Diagnosis not present

## 2020-08-07 DIAGNOSIS — I872 Venous insufficiency (chronic) (peripheral): Secondary | ICD-10-CM | POA: Diagnosis not present

## 2020-08-07 NOTE — Progress Notes (Signed)
History of Present Illness  There is no documented history at this time  Assessments & Plan   There are no diagnoses linked to this encounter.    Additional instructions  Subjective:  Patient presents with venous ulcer of the Bilateral lower extremity.    Procedure:  3 layer unna wrap was placed Bilateral lower extremity.   Plan:   Follow up in one week.  

## 2020-08-11 ENCOUNTER — Encounter (INDEPENDENT_AMBULATORY_CARE_PROVIDER_SITE_OTHER): Payer: Self-pay | Admitting: Nurse Practitioner

## 2020-08-14 ENCOUNTER — Other Ambulatory Visit: Payer: Self-pay

## 2020-08-14 ENCOUNTER — Ambulatory Visit (INDEPENDENT_AMBULATORY_CARE_PROVIDER_SITE_OTHER): Payer: Medicare Other | Admitting: Nurse Practitioner

## 2020-08-14 VITALS — BP 125/80 | HR 92 | Ht 70.0 in | Wt 255.0 lb

## 2020-08-14 DIAGNOSIS — I872 Venous insufficiency (chronic) (peripheral): Secondary | ICD-10-CM

## 2020-08-14 DIAGNOSIS — L97909 Non-pressure chronic ulcer of unspecified part of unspecified lower leg with unspecified severity: Secondary | ICD-10-CM | POA: Diagnosis not present

## 2020-08-14 NOTE — Progress Notes (Signed)
History of Present Illness  There is no documented history at this time  Assessments & Plan   There are no diagnoses linked to this encounter.    Additional instructions  Subjective:  Patient presents with venous ulcer of the Bilateral lower extremity.    Procedure:  3 layer unna wrap was placed Bilateral lower extremity.   Plan:   Follow up in one week.  

## 2020-08-15 ENCOUNTER — Other Ambulatory Visit: Payer: Self-pay

## 2020-08-15 ENCOUNTER — Encounter: Payer: Self-pay | Admitting: Podiatry

## 2020-08-15 ENCOUNTER — Ambulatory Visit (INDEPENDENT_AMBULATORY_CARE_PROVIDER_SITE_OTHER): Payer: Medicare Other | Admitting: Podiatry

## 2020-08-15 ENCOUNTER — Other Ambulatory Visit: Payer: Self-pay | Admitting: Internal Medicine

## 2020-08-15 DIAGNOSIS — M79675 Pain in left toe(s): Secondary | ICD-10-CM

## 2020-08-15 DIAGNOSIS — E1151 Type 2 diabetes mellitus with diabetic peripheral angiopathy without gangrene: Secondary | ICD-10-CM | POA: Diagnosis not present

## 2020-08-15 DIAGNOSIS — W450XXA Nail entering through skin, initial encounter: Secondary | ICD-10-CM | POA: Diagnosis not present

## 2020-08-15 DIAGNOSIS — B351 Tinea unguium: Secondary | ICD-10-CM

## 2020-08-15 DIAGNOSIS — M79674 Pain in right toe(s): Secondary | ICD-10-CM | POA: Diagnosis not present

## 2020-08-15 NOTE — Progress Notes (Signed)
This patient returns to my office for at risk foot care.  This patient requires this care by a professional since this patient will be at risk due to having chronic kidney disease and diabetes.  This patient is unable to cut nails himself since the patient cannot reach his nails.These nails are painful walking and wearing shoes. This patient is wearing unna boots on both legs/feet. This patient presents for at risk foot care today.  General Appearance  Alert, conversant and in no acute stress.  Vascular  Deferred due to unna boots.  Neurologic  Deferred due to unna boots.  Nails Thick disfigured discolored nails with subungual debris  from hallux to fifth toes bilaterally. No evidence of bacterial infection or drainage bilaterally. Left hallux nail is loosely attached with no drainage.  Orthopedic  No limitations of motion  feet .  No crepitus or effusions noted.  No bony pathology or digital deformities noted.  Skin  normotropic skin with no porokeratosis noted bilaterally.  No signs of infections or ulcers noted.   Black skin necrosis fifth toe left foot.  Onychomycosis  Pain in right toes  Pain in left toes  Consent was obtained for treatment procedures.   Mechanical debridement of nails 1-5  bilaterally performed with a nail nipper.  Filed with dremel without incident. His nails and feet are getting harder to care for since he continually wears unna boots.  Upon trimming his nails his left hallux toenail was removed and bandaged with neosporin and DSD.   Told him to watch his feet better in future.   Return office visit    3 months                 Told patient to return for periodic foot care and evaluation due to potential at risk complications.   [name] DPM

## 2020-08-18 ENCOUNTER — Encounter (INDEPENDENT_AMBULATORY_CARE_PROVIDER_SITE_OTHER): Payer: Self-pay | Admitting: Nurse Practitioner

## 2020-08-21 ENCOUNTER — Other Ambulatory Visit: Payer: Self-pay

## 2020-08-21 ENCOUNTER — Encounter (INDEPENDENT_AMBULATORY_CARE_PROVIDER_SITE_OTHER): Payer: Self-pay

## 2020-08-21 ENCOUNTER — Ambulatory Visit (INDEPENDENT_AMBULATORY_CARE_PROVIDER_SITE_OTHER): Payer: Medicare Other | Admitting: Nurse Practitioner

## 2020-08-21 VITALS — BP 115/62 | HR 83 | Resp 16 | Wt 255.0 lb

## 2020-08-21 DIAGNOSIS — I872 Venous insufficiency (chronic) (peripheral): Secondary | ICD-10-CM | POA: Diagnosis not present

## 2020-08-21 DIAGNOSIS — L97909 Non-pressure chronic ulcer of unspecified part of unspecified lower leg with unspecified severity: Secondary | ICD-10-CM | POA: Diagnosis not present

## 2020-08-21 NOTE — Progress Notes (Signed)
History of Present Illness  There is no documented history at this time  Assessments & Plan   There are no diagnoses linked to this encounter.    Additional instructions  Subjective:  Patient presents with venous ulcer of the Bilateral lower extremity.    Procedure:  3 layer unna wrap was placed Bilateral lower extremity.   Plan:   Follow up in one week.  

## 2020-08-28 ENCOUNTER — Ambulatory Visit (INDEPENDENT_AMBULATORY_CARE_PROVIDER_SITE_OTHER): Payer: Medicare Other | Admitting: Nurse Practitioner

## 2020-08-28 ENCOUNTER — Encounter (INDEPENDENT_AMBULATORY_CARE_PROVIDER_SITE_OTHER): Payer: Self-pay | Admitting: Nurse Practitioner

## 2020-08-28 ENCOUNTER — Other Ambulatory Visit: Payer: Self-pay

## 2020-08-28 VITALS — BP 135/74 | HR 98 | Ht 69.0 in | Wt 257.0 lb

## 2020-08-28 DIAGNOSIS — E7800 Pure hypercholesterolemia, unspecified: Secondary | ICD-10-CM | POA: Diagnosis not present

## 2020-08-28 DIAGNOSIS — I10 Essential (primary) hypertension: Secondary | ICD-10-CM

## 2020-08-28 DIAGNOSIS — I890 Lymphedema, not elsewhere classified: Secondary | ICD-10-CM | POA: Diagnosis not present

## 2020-09-01 ENCOUNTER — Encounter (INDEPENDENT_AMBULATORY_CARE_PROVIDER_SITE_OTHER): Payer: Self-pay | Admitting: Nurse Practitioner

## 2020-09-01 NOTE — Progress Notes (Signed)
Subjective:    [name], male    DOB: [birth year], 74 y.o.   MRN: [id number] Chief Complaint  Patient presents with  . Follow-up    4 week  unn aboot Follow up     The patient is following up today for evaluation of his lower extremity edema and ulcerations.  The patient notes that he recently had his diuretics changed by his cardiologist and he has lost a lot of fluid.  Since his last visit the patient has begun taking a little less diuretic however his legs have remained has remained under control impression leg swelling.  The patient's legs look remarkably well today.  The patient denies any significant weeping such as before.  He does not endorse significant pain such as he has before with cellulitis.  He denies any fever, chills, nausea, vomiting or diarrhea.  He denies any extensive change in his fluid status or worsening shortness of breath.  The patient does have some redness of his fourth and fifth toe on his right lower extremity however the patient notes that he recently hit it against something although he cannot remember what exactly.  There is no open wounds or ulcers on these toes however   Review of Systems  Cardiovascular: Positive for leg swelling.  All other systems reviewed and are negative.      Objective:   Physical Exam Vitals reviewed.  Cardiovascular:     Rate and Rhythm: Normal rate.     Pulses: Decreased pulses.  Pulmonary:     Effort: Pulmonary effort is normal.  Skin:    General: Skin is warm and dry.     Findings: Erythema present.  Neurological:     Mental Status: He is alert and oriented to person, place, and time.     Motor: Weakness present.  Psychiatric:        Mood and Affect: Mood normal.        Behavior: Behavior normal.        Thought Content: Thought content normal.        Judgment: Judgment normal.     BP 135/74   Pulse 98   Ht 5\' 9"  (1.753 m)   Wt 257 lb (116.6 kg)   BMI 37.95 kg/m   Past Medical History:    Diagnosis Date  . CHF (congestive heart failure) ([place])   . Depression   . 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 Gastroparesis   . Graves disease   . Hypercholesterolemia   . Hypertension   . Mantle cell lymphoma ([place]) 07/2014  . Peripheral neuropathy   . Shingles     Social History   Socioeconomic History  . Marital status: Married    Spouse name: Not on file  . Number of children: 1  . Years of education: Not on file  . Highest education level: Not on file  Occupational History  . Not on file  Tobacco Use  . Smoking status: Former Research scientist (life sciences)  . Smokeless tobacco: Current User    Types: Chew  Vaping Use  . Vaping Use: Never used  Substance and Sexual Activity  . Alcohol use: Yes    Alcohol/week: 3.0 standard drinks    Types: 3 Cans of beer per week    Comment: nightly  . Drug use: Never  . Sexual activity: Not Currently  Other Topics Concern  . Not on file  Social History Narrative  . Not  on file   Social Determinants of Health   Financial Resource Strain:   . Difficulty of Paying Living Expenses: Not on file  Food Insecurity:   . Worried About Charity fundraiser in the Last Year: Not on file  . Ran Out of Food in the Last Year: Not on file  Transportation Needs:   . Lack of Transportation (Medical): Not on file  . Lack of Transportation (Non-Medical): Not on file  Physical Activity:   . Days of Exercise per Week: Not on file  . Minutes of Exercise per Session: Not on file  Stress:   . Feeling of Stress : Not on file  Social Connections:   . Frequency of Communication with Friends and Family: Not on file  . Frequency of Social Gatherings with Friends and Family: Not on file  . Attends Religious Services: Not on file  . Active Member of Clubs or Organizations: Not on file  . Attends Archivist Meetings: Not on file  . Marital Status: Not on file  Intimate Partner Violence:   . Fear of  Current or Ex-Partner: Not on file  . Emotionally Abused: Not on file  . Physically Abused: Not on file  . Sexually Abused: Not on file    Past Surgical History:  Procedure Laterality Date  . CHOLECYSTECTOMY    . NEPHRECTOMY     right  . PACEMAKER INSERTION N/A 12/07/2017   Procedure: INSERTION PACEMAKER DUAL CHAMBER INITIAL INSERT;  Surgeon: Isaias Cowman, MD;  Location: [facility];  Service: Cardiovascular;  Laterality: N/A;  . PORTA CATH INSERTION N/A 11/03/2017   Procedure: PORTA CATH INSERTION;  Surgeon: Katha Cabal, MD;  Location: [place] [facility];  Service: Cardiovascular;  Laterality: N/A;    Family History  Problem Relation Age of Onset  . Breast cancer Mother   . Diabetes Father   . Cancer Father        Lymphoma  . Alcohol abuse Maternal Uncle   . Diabetes Sister   . Heart disease Other        grandfather    Allergies  Allergen Reactions  . Rofecoxib Nausea Only    CBC Latest Ref Rng & Units 07/29/2020 06/24/2020 01/24/2020  WBC 4.0 - 10.5 K/uL 6.1 6.3 5.7  Hemoglobin 13.0 - 17.0 g/dL 13.9 13.1 13.5  Hematocrit 39 - 52 % 40.0 39.0 40.8  Platelets 150 - 400 K/uL 102(L) 131.0(L) 118(L)      CMP     Component Value Date/Time   NA 139 08/06/2020 1110   NA 138 07/15/2014 0515   K 3.9 08/06/2020 1110   K 3.5 01/16/2015 1429   CL 103 08/06/2020 1110   CL 110 (H) 07/15/2014 0515   CO2 26 08/06/2020 1110   CO2 24 07/15/2014 0515   GLUCOSE 85 08/06/2020 1110   GLUCOSE 145 (H) 07/15/2014 0515   BUN 18 08/06/2020 1110   BUN 17 07/15/2014 0515   CREATININE 1.78 (H) 08/06/2020 1110   CREATININE 0.89 01/16/2015 1429   CALCIUM 8.4 08/06/2020 1110   CALCIUM 7.1 (L) 07/15/2014 0515   PROT 7.1 07/29/2020 0952   PROT 6.2 (L) 10/22/2014 1000   ALBUMIN 3.4 (L) 07/29/2020 0952   ALBUMIN 2.6 (L) 10/22/2014 1000   AST 24 07/29/2020 0952   AST 19 10/22/2014 1000   ALT 16 07/29/2020 0952   ALT 21 10/22/2014 1000   ALKPHOS 115 07/29/2020 0952   ALKPHOS  118 (H) 10/22/2014 1000   BILITOT  1.1 07/29/2020 0952   BILITOT 0.8 10/22/2014 1000   GFRNONAA 32 (L) 07/29/2020 0952   GFRNONAA >60 01/16/2015 1429   GFRAA 41 (L) 01/24/2020 0925   GFRAA >60 01/16/2015 1429     No results found.     Assessment & Plan:   1. Lymphedema Overall the patient's edema is doing well.  The patient will continue with the prescribed diuretics.  We will continue to place the patient in bilateral Unna wraps.  These wraps will be changed on a weekly basis.  We will reevaluate the patient in 4 weeks.  2. Essential hypertension, benign Continue antihypertensive medications as already ordered, these medications have been reviewed and there are no changes at this time.   3. Hypercholesterolemia Continue statin as ordered and reviewed, no changes at this time    Current Outpatient Medications on File Prior to Visit  Medication Sig Dispense Refill  . ACCU-CHEK AVIVA PLUS test strip USE AS INSTRUCTED. USE 1 STRIP EVERY 3 HOURS . ACCU-CHECK AVIVA PLUS TEST STRIP. DX E10.649  12  . acetaminophen (TYLENOL) 500 MG tablet Take 500 mg by mouth every 6 (six) hours as needed.     . carvedilol (COREG) 3.125 MG tablet Take 1 tablet by mouth 2 (two) times daily with a meal.    . cephALEXin (KEFLEX) 500 MG capsule Take 500 mg by mouth 2 (two) times daily.    . cyanocobalamin (,VITAMIN B-12,) 1000 MCG/ML injection Inject into the muscle.    . ferrous sulfate 325 (65 FE) MG tablet Take 325 mg by mouth daily.    . furosemide (LASIX) 20 MG tablet Take 40 mg by mouth 2 (two) times daily. 2 tablets in the morning 1 tablet in the afternoon as needed    . gabapentin (NEURONTIN) 100 MG capsule Take by mouth.    Marland Kitchen GLUCAGON EMERGENCY 1 MG injection     . glucagon, human recombinant, (GLUCAGEN DIAGNOSTIC) 1 MG injection Use as directed    . glucose 4 GM chewable tablet Chew 1 tablet by mouth once as needed for low blood sugar.    Marland Kitchen glucose blood (PRECISION QID TEST) test strip Use 8  (eight) times daily as directed. BAYER CONTOUR NEXT TEST STRIPS E10.649    . insulin aspart (NOVOLOG) 100 UNIT/ML injection Basal rate 12am 0.8 units per hour, 9am 0.9 units per hour. (total basal insulin 20.7 units). Carbohydrate ratio 1 units for every 8gm of carbohydrate. Correction factor 1 units for every 50mg /dl over target cbg. Target CBG 80-120 1 vial 12  . levothyroxine (SYNTHROID) 25 MCG tablet PLEASE SEE ATTACHED FOR DETAILED DIRECTIONS    . lisinopril (ZESTRIL) 20 MG tablet Take by mouth.    . nystatin cream (MYCOSTATIN) APPLY TO AFFECTED AREA TWICE A DAY 30 g 0  . potassium chloride (KLOR-CON) 10 MEQ tablet TAKE 1 TABLET BY MOUTH TWICE DAILY FOR 3 DAYS THEN CONTINUE ONCE DAILY. 30 tablet 0  . pravastatin (PRAVACHOL) 40 MG tablet Take 40 mg by mouth daily.     . prochlorperazine (COMPAZINE) 10 MG tablet TAKE 1 TABLET (10 MG TOTAL) BY MOUTH EVERY 6 (SIX) HOURS AS NEEDED (NAUSEA OR VOMITING).    Marland Kitchen spironolactone (ALDACTONE) 25 MG tablet Take by mouth.    . valACYclovir (VALTREX) 1000 MG tablet Take 1 tablet (1,000 mg total) by mouth 3 (three) times daily. 21 tablet 0  . vitamin B-12 (CYANOCOBALAMIN) 250 MCG tablet Take 250 mcg by mouth daily.    . Vitamin D, Cholecalciferol, 10  MCG (400 UNIT) TABS Take by mouth.    . carvedilol (COREG) 3.125 MG tablet Take 3.125 mg by mouth 2 (two) times daily with a meal.      Current Facility-Administered Medications on File Prior to Visit  Medication Dose Route Frequency Provider Last Rate Last Admin  . heparin lock flush 100 unit/mL  500 Units Intravenous Once Lloyd Huger, MD      . sodium chloride flush (NS) 0.9 % injection 10 mL  10 mL Intravenous PRN Lloyd Huger, MD   10 mL at 02/01/18 0829  . sodium chloride flush (NS) 0.9 % injection 10 mL  10 mL Intravenous PRN Lloyd Huger, MD   10 mL at 04/05/18 0800    There are no Patient Instructions on file for this visit. No follow-ups on file.   Kris Hartmann, NP

## 2020-09-04 ENCOUNTER — Other Ambulatory Visit: Payer: Self-pay

## 2020-09-04 ENCOUNTER — Ambulatory Visit (INDEPENDENT_AMBULATORY_CARE_PROVIDER_SITE_OTHER): Payer: Medicare Other | Admitting: Nurse Practitioner

## 2020-09-04 ENCOUNTER — Other Ambulatory Visit: Payer: Self-pay | Admitting: Internal Medicine

## 2020-09-04 VITALS — BP 123/77 | HR 105 | Ht 69.0 in | Wt 258.0 lb

## 2020-09-04 DIAGNOSIS — L97201 Non-pressure chronic ulcer of unspecified calf limited to breakdown of skin: Secondary | ICD-10-CM

## 2020-09-04 NOTE — Progress Notes (Signed)
History of Present Illness  There is no documented history at this time  Assessments & Plan   There are no diagnoses linked to this encounter.    Additional instructions  Subjective:  Patient presents with venous ulcer of the Bilateral lower extremity.    Procedure:  3 layer unna wrap was placed Bilateral lower extremity.   Plan:   Follow up in one week.  

## 2020-09-09 ENCOUNTER — Encounter (INDEPENDENT_AMBULATORY_CARE_PROVIDER_SITE_OTHER): Payer: Self-pay | Admitting: Nurse Practitioner

## 2020-09-11 ENCOUNTER — Other Ambulatory Visit: Payer: Self-pay

## 2020-09-11 ENCOUNTER — Ambulatory Visit (INDEPENDENT_AMBULATORY_CARE_PROVIDER_SITE_OTHER): Payer: Medicare Other | Admitting: Nurse Practitioner

## 2020-09-11 VITALS — BP 134/84 | HR 109 | Ht 70.0 in | Wt 262.0 lb

## 2020-09-11 DIAGNOSIS — L97201 Non-pressure chronic ulcer of unspecified calf limited to breakdown of skin: Secondary | ICD-10-CM | POA: Diagnosis not present

## 2020-09-11 NOTE — Progress Notes (Signed)
History of Present Illness  There is no documented history at this time  Assessments & Plan   There are no diagnoses linked to this encounter.    Additional instructions  Subjective:  Patient presents with venous ulcer of the Bilateral lower extremity.    Procedure:  3 layer unna wrap was placed Bilateral lower extremity.   Plan:   Follow up in one week.  

## 2020-09-12 ENCOUNTER — Encounter (INDEPENDENT_AMBULATORY_CARE_PROVIDER_SITE_OTHER): Payer: Self-pay | Admitting: Nurse Practitioner

## 2020-09-18 ENCOUNTER — Ambulatory Visit (INDEPENDENT_AMBULATORY_CARE_PROVIDER_SITE_OTHER): Payer: Medicare Other | Admitting: Nurse Practitioner

## 2020-09-18 ENCOUNTER — Encounter (INDEPENDENT_AMBULATORY_CARE_PROVIDER_SITE_OTHER): Payer: Self-pay | Admitting: Nurse Practitioner

## 2020-09-18 ENCOUNTER — Other Ambulatory Visit: Payer: Self-pay

## 2020-09-18 VITALS — BP 119/64 | HR 115 | Ht 70.0 in | Wt 259.0 lb

## 2020-09-18 DIAGNOSIS — E7800 Pure hypercholesterolemia, unspecified: Secondary | ICD-10-CM | POA: Diagnosis not present

## 2020-09-18 DIAGNOSIS — I10 Essential (primary) hypertension: Secondary | ICD-10-CM | POA: Diagnosis not present

## 2020-09-18 DIAGNOSIS — I890 Lymphedema, not elsewhere classified: Secondary | ICD-10-CM

## 2020-09-19 ENCOUNTER — Inpatient Hospital Stay: Payer: Medicare Other | Attending: Oncology

## 2020-09-19 DIAGNOSIS — Z452 Encounter for adjustment and management of vascular access device: Secondary | ICD-10-CM | POA: Diagnosis not present

## 2020-09-19 DIAGNOSIS — C8310 Mantle cell lymphoma, unspecified site: Secondary | ICD-10-CM | POA: Diagnosis not present

## 2020-09-19 DIAGNOSIS — Z95828 Presence of other vascular implants and grafts: Secondary | ICD-10-CM

## 2020-09-19 MED ORDER — HEPARIN SOD (PORK) LOCK FLUSH 100 UNIT/ML IV SOLN
500.0000 [IU] | Freq: Once | INTRAVENOUS | Status: AC
  Administered 2020-09-19: 500 [IU] via INTRAVENOUS
  Filled 2020-09-19: qty 5

## 2020-09-19 MED ORDER — HEPARIN SOD (PORK) LOCK FLUSH 100 UNIT/ML IV SOLN
INTRAVENOUS | Status: AC
  Filled 2020-09-19: qty 5

## 2020-09-19 MED ORDER — SODIUM CHLORIDE 0.9% FLUSH
10.0000 mL | Freq: Once | INTRAVENOUS | Status: AC
  Administered 2020-09-19: 10 mL via INTRAVENOUS
  Filled 2020-09-19: qty 10

## 2020-09-22 NOTE — Progress Notes (Signed)
Subjective:    [name], male    DOB: 1945/11/22, 74 y.o.   MRN: [id number] Chief Complaint  Patient presents with  . Follow-up    4 wk unna boot check     The patient is following up today for evaluation of his lower extremity edema and ulcerations.  The patient is still doing well with his lower extremity edema.  Since his last visit he still taking the same amount of diuretic as previously.  There is no significant weeping no worsening wounds or ulcerations.  The patient does not seem to have worsening shortness of breath.  He does have some scrapes on his toes however that may be more so related to rubbing from his shoes.     Review of Systems  Cardiovascular: Positive for leg swelling.  Neurological: Positive for weakness.  All other systems reviewed and are negative.      Objective:   Physical Exam Vitals reviewed.  HENT:     Head: Normocephalic.  Cardiovascular:     Rate and Rhythm: Normal rate.     Pulses: Normal pulses.  Pulmonary:     Effort: Pulmonary effort is normal.  Musculoskeletal:     Right lower leg: Edema present.     Left lower leg: Edema present.  Neurological:     Mental Status: He is alert and oriented to person, place, and time.  Psychiatric:        Mood and Affect: Mood normal.        Behavior: Behavior normal.        Thought Content: Thought content normal.        Judgment: Judgment normal.     BP 119/64   Pulse (!) 115   Ht 5\' 10"  (1.778 m)   Wt 259 lb (117.5 kg)   BMI 37.16 kg/m   Past Medical History:  Diagnosis Date  . CHF (congestive heart failure) ([place])   . Depression   . 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 Gastroparesis   . Graves disease   . Hypercholesterolemia   . Hypertension   . Mantle cell lymphoma ([place]) 07/2014  . Peripheral neuropathy   . Shingles     Social History   Socioeconomic History  . Marital status: Married    Spouse name:  Not on file  . Number of children: 1  . Years of education: Not on file  . Highest education level: Not on file  Occupational History  . Not on file  Tobacco Use  . Smoking status: Former Research scientist (life sciences)  . Smokeless tobacco: Current User    Types: Chew  Vaping Use  . Vaping Use: Never used  Substance and Sexual Activity  . Alcohol use: Yes    Alcohol/week: 3.0 standard drinks    Types: 3 Cans of beer per week    Comment: nightly  . Drug use: Never  . Sexual activity: Not Currently  Other Topics Concern  . Not on file  Social History Narrative  . Not on file   Social Determinants of Health   Financial Resource Strain: Not on file  Food Insecurity: Not on file  Transportation Needs: Not on file  Physical Activity: Not on file  Stress: Not on file  Social Connections: Not on file  Intimate Partner Violence: Not on file    Past Surgical History:  Procedure Laterality Date  . CHOLECYSTECTOMY    . NEPHRECTOMY  right  . PACEMAKER INSERTION N/A 12/07/2017   Procedure: INSERTION PACEMAKER DUAL CHAMBER INITIAL INSERT;  Surgeon: Isaias Cowman, MD;  Location: [facility];  Service: Cardiovascular;  Laterality: N/A;  . PORTA CATH INSERTION N/A 11/03/2017   Procedure: PORTA CATH INSERTION;  Surgeon: Katha Cabal, MD;  Location: [facility];  Service: Cardiovascular;  Laterality: N/A;    Family History  Problem Relation Age of Onset  . Breast cancer Mother   . Diabetes Father   . Cancer Father        Lymphoma  . Alcohol abuse Maternal Uncle   . Diabetes Sister   . Heart disease Other        grandfather    Allergies  Allergen Reactions  . Rofecoxib Nausea Only    CBC Latest Ref Rng & Units 07/29/2020 06/24/2020 01/24/2020  WBC 4.0 - 10.5 K/uL 6.1 6.3 5.7  Hemoglobin 13.0 - 17.0 g/dL 13.9 13.1 13.5  Hematocrit 39.0 - 52.0 % 40.0 39.0 40.8  Platelets 150 - 400 K/uL 102(L) 131.0(L) 118(L)      CMP     Component Value Date/Time   NA 139 08/06/2020 1110    NA 138 07/15/2014 0515   K 3.9 08/06/2020 1110   K 3.5 01/16/2015 1429   CL 103 08/06/2020 1110   CL 110 (H) 07/15/2014 0515   CO2 26 08/06/2020 1110   CO2 24 07/15/2014 0515   GLUCOSE 85 08/06/2020 1110   GLUCOSE 145 (H) 07/15/2014 0515   BUN 18 08/06/2020 1110   BUN 17 07/15/2014 0515   CREATININE 1.78 (H) 08/06/2020 1110   CREATININE 0.89 01/16/2015 1429   CALCIUM 8.4 08/06/2020 1110   CALCIUM 7.1 (L) 07/15/2014 0515   PROT 7.1 07/29/2020 0952   PROT 6.2 (L) 10/22/2014 1000   ALBUMIN 3.4 (L) 07/29/2020 0952   ALBUMIN 2.6 (L) 10/22/2014 1000   AST 24 07/29/2020 0952   AST 19 10/22/2014 1000   ALT 16 07/29/2020 0952   ALT 21 10/22/2014 1000   ALKPHOS 115 07/29/2020 0952   ALKPHOS 118 (H) 10/22/2014 1000   BILITOT 1.1 07/29/2020 0952   BILITOT 0.8 10/22/2014 1000   GFRNONAA 32 (L) 07/29/2020 0952   GFRNONAA >60 01/16/2015 1429   GFRAA 41 (L) 01/24/2020 0925   GFRAA >60 01/16/2015 1429     No results found.     Assessment & Plan:   1. Lymphedema Overall the patient's edema is doing well.  The patient will continue with the prescribed diuretics.  We will continue to place the patient in bilateral Unna wraps.  These wraps will be changed on a weekly basis.  We will reevaluate the patient in 4 weeks.    2. Essential hypertension, benign Continue antihypertensive medications as already ordered, these medications have been reviewed and there are no changes at this time.   3. Hypercholesterolemia Continue statin as ordered and reviewed, no changes at this time    Current Outpatient Medications on File Prior to Visit  Medication Sig Dispense Refill  . ACCU-CHEK AVIVA PLUS test strip USE AS INSTRUCTED. USE 1 STRIP EVERY 3 HOURS . ACCU-CHECK AVIVA PLUS TEST STRIP. DX E10.649  12  . acetaminophen (TYLENOL) 500 MG tablet Take 500 mg by mouth every 6 (six) hours as needed.     . carvedilol (COREG) 3.125 MG tablet Take 1 tablet by mouth 2 (two) times daily with a meal.     . cephALEXin (KEFLEX) 500 MG capsule Take 500 mg by mouth  2 (two) times daily.    . cyanocobalamin (,VITAMIN B-12,) 1000 MCG/ML injection Inject into the muscle.    . ferrous sulfate 325 (65 FE) MG tablet Take 325 mg by mouth daily.    . furosemide (LASIX) 20 MG tablet Take 40 mg by mouth 2 (two) times daily. 2 tablets in the morning 1 tablet in the afternoon as needed    . gabapentin (NEURONTIN) 100 MG capsule Take by mouth.    Marland Kitchen GLUCAGON EMERGENCY 1 MG injection     . glucagon, human recombinant, (GLUCAGEN DIAGNOSTIC) 1 MG injection Use as directed    . glucose 4 GM chewable tablet Chew 1 tablet by mouth once as needed for low blood sugar.    Marland Kitchen glucose blood (PRECISION QID TEST) test strip Use 8 (eight) times daily as directed. BAYER CONTOUR NEXT TEST STRIPS E10.649    . insulin aspart (NOVOLOG) 100 UNIT/ML injection Basal rate 12am 0.8 units per hour, 9am 0.9 units per hour. (total basal insulin 20.7 units). Carbohydrate ratio 1 units for every 8gm of carbohydrate. Correction factor 1 units for every 50mg /dl over target cbg. Target CBG 80-120 1 vial 12  . levothyroxine (SYNTHROID) 25 MCG tablet PLEASE SEE ATTACHED FOR DETAILED DIRECTIONS    . lisinopril (ZESTRIL) 20 MG tablet Take by mouth.    . nystatin cream (MYCOSTATIN) APPLY TO AFFECTED AREA TWICE A DAY 30 g 0  . potassium chloride (KLOR-CON) 10 MEQ tablet TAKE 1 TABLET BY MOUTH TWICE DAILY FOR 3 DAYS THEN CONTINUE ONCE DAILY. 30 tablet 0  . pravastatin (PRAVACHOL) 40 MG tablet Take 40 mg by mouth daily.     . prochlorperazine (COMPAZINE) 10 MG tablet TAKE 1 TABLET (10 MG TOTAL) BY MOUTH EVERY 6 (SIX) HOURS AS NEEDED (NAUSEA OR VOMITING).    Marland Kitchen spironolactone (ALDACTONE) 25 MG tablet Take by mouth.    . valACYclovir (VALTREX) 1000 MG tablet Take 1 tablet (1,000 mg total) by mouth 3 (three) times daily. 21 tablet 0  . vitamin B-12 (CYANOCOBALAMIN) 250 MCG tablet Take 250 mcg by mouth daily.    . Vitamin D, Cholecalciferol, 10 MCG (400 UNIT)  TABS Take by mouth.    . carvedilol (COREG) 3.125 MG tablet Take 3.125 mg by mouth 2 (two) times daily with a meal.      Current Facility-Administered Medications on File Prior to Visit  Medication Dose Route Frequency Provider Last Rate Last Admin  . heparin lock flush 100 unit/mL  500 Units Intravenous Once Lloyd Huger, MD      . sodium chloride flush (NS) 0.9 % injection 10 mL  10 mL Intravenous PRN Lloyd Huger, MD   10 mL at 02/01/18 0829  . sodium chloride flush (NS) 0.9 % injection 10 mL  10 mL Intravenous PRN Lloyd Huger, MD   10 mL at 04/05/18 0800    There are no Patient Instructions on file for this visit. No follow-ups on file.   Kris Hartmann, NP

## 2020-09-23 ENCOUNTER — Encounter (INDEPENDENT_AMBULATORY_CARE_PROVIDER_SITE_OTHER): Payer: Self-pay | Admitting: Nurse Practitioner

## 2020-09-25 ENCOUNTER — Other Ambulatory Visit: Payer: Self-pay

## 2020-09-25 ENCOUNTER — Ambulatory Visit (INDEPENDENT_AMBULATORY_CARE_PROVIDER_SITE_OTHER): Payer: Medicare Other | Admitting: Nurse Practitioner

## 2020-09-25 VITALS — BP 142/76 | HR 110 | Ht 70.0 in | Wt 258.0 lb

## 2020-09-25 DIAGNOSIS — Z905 Acquired absence of kidney: Secondary | ICD-10-CM | POA: Diagnosis not present

## 2020-09-25 DIAGNOSIS — L97201 Non-pressure chronic ulcer of unspecified calf limited to breakdown of skin: Secondary | ICD-10-CM | POA: Diagnosis not present

## 2020-09-25 DIAGNOSIS — E1029 Type 1 diabetes mellitus with other diabetic kidney complication: Secondary | ICD-10-CM | POA: Diagnosis not present

## 2020-09-25 DIAGNOSIS — I10 Essential (primary) hypertension: Secondary | ICD-10-CM | POA: Diagnosis not present

## 2020-09-25 DIAGNOSIS — N1832 Chronic kidney disease, stage 3b: Secondary | ICD-10-CM | POA: Diagnosis not present

## 2020-09-25 DIAGNOSIS — R600 Localized edema: Secondary | ICD-10-CM | POA: Diagnosis not present

## 2020-09-25 NOTE — Progress Notes (Signed)
History of Present Illness  There is no documented history at this time  Assessments & Plan   There are no diagnoses linked to this encounter.    Additional instructions  Subjective:  Patient presents with venous ulcer of the Bilateral lower extremity.    Procedure:  3 layer unna wrap was placed Bilateral lower extremity.   Plan:   Follow up in one week.  

## 2020-09-30 ENCOUNTER — Encounter (INDEPENDENT_AMBULATORY_CARE_PROVIDER_SITE_OTHER): Payer: Self-pay | Admitting: Nurse Practitioner

## 2020-10-02 ENCOUNTER — Other Ambulatory Visit: Payer: Self-pay

## 2020-10-02 ENCOUNTER — Ambulatory Visit (INDEPENDENT_AMBULATORY_CARE_PROVIDER_SITE_OTHER): Payer: Medicare Other | Admitting: Nurse Practitioner

## 2020-10-02 ENCOUNTER — Encounter (INDEPENDENT_AMBULATORY_CARE_PROVIDER_SITE_OTHER): Payer: Self-pay

## 2020-10-02 VITALS — BP 150/68 | HR 60 | Resp 16 | Wt 255.8 lb

## 2020-10-02 DIAGNOSIS — L97201 Non-pressure chronic ulcer of unspecified calf limited to breakdown of skin: Secondary | ICD-10-CM | POA: Diagnosis not present

## 2020-10-02 NOTE — Progress Notes (Signed)
History of Present Illness  There is no documented history at this time  Assessments & Plan   There are no diagnoses linked to this encounter.    Additional instructions  Subjective:  Patient presents with venous ulcer of the Bilateral lower extremity.    Procedure:  3 layer unna wrap was placed Bilateral lower extremity.   Plan:   Follow up in one week.  

## 2020-10-07 ENCOUNTER — Encounter: Payer: Self-pay | Admitting: Podiatry

## 2020-10-07 ENCOUNTER — Ambulatory Visit (INDEPENDENT_AMBULATORY_CARE_PROVIDER_SITE_OTHER): Payer: Medicare Other | Admitting: Podiatry

## 2020-10-07 ENCOUNTER — Other Ambulatory Visit: Payer: Self-pay

## 2020-10-07 DIAGNOSIS — N179 Acute kidney failure, unspecified: Secondary | ICD-10-CM

## 2020-10-07 DIAGNOSIS — M79674 Pain in right toe(s): Secondary | ICD-10-CM | POA: Diagnosis not present

## 2020-10-07 DIAGNOSIS — M79675 Pain in left toe(s): Secondary | ICD-10-CM

## 2020-10-07 DIAGNOSIS — E1151 Type 2 diabetes mellitus with diabetic peripheral angiopathy without gangrene: Secondary | ICD-10-CM

## 2020-10-07 DIAGNOSIS — B351 Tinea unguium: Secondary | ICD-10-CM | POA: Diagnosis not present

## 2020-10-07 NOTE — Progress Notes (Signed)
This patient returns to my office for at risk foot care.  This patient requires this care by a professional since this patient will be at risk due to having chronic kidney disease and diabetes.  This patient is unable to cut nails himself since the patient cannot reach his nails.These nails are painful walking and wearing shoes. This patient is wearing unna boots on both legs/feet. This patient presents for at risk foot care today.  General Appearance  Alert, conversant and in no acute stress.  Vascular  Deferred due to unna boots.  Neurologic  Deferred due to unna boots.  Nails Thick disfigured discolored nails with subungual debris  from hallux to fifth toes bilaterally. No evidence of bacterial infection or drainage bilaterally. Left hallux nail is loosely attached with no drainage.  Orthopedic  No limitations of motion  feet .  No crepitus or effusions noted.  No bony pathology or digital deformities noted.  Skin  normotropic skin with no porokeratosis noted bilaterally.  No signs of infections or ulcers noted.   Black skin necrosis fifth toe left foot.  Onychomycosis  Pain in right toes  Pain in left toes  Consent was obtained for treatment procedures.   Mechanical debridement of nails 1-5  bilaterally performed with a nail nipper.  Filed with dremel without incident. Hard callus skin on dorsum of 4,5 digits were removed.  DSD applied 4th toe right foot.   These develop due to his unna boots.   Return office visit    3 months                 Told patient to return for periodic foot care and evaluation due to potential at risk complications.   [name] DPM

## 2020-10-09 ENCOUNTER — Ambulatory Visit (INDEPENDENT_AMBULATORY_CARE_PROVIDER_SITE_OTHER): Payer: Medicare Other | Admitting: Nurse Practitioner

## 2020-10-09 ENCOUNTER — Other Ambulatory Visit: Payer: Self-pay

## 2020-10-09 VITALS — BP 132/76 | HR 74 | Ht 70.0 in | Wt 258.0 lb

## 2020-10-09 DIAGNOSIS — I890 Lymphedema, not elsewhere classified: Secondary | ICD-10-CM | POA: Diagnosis not present

## 2020-10-09 NOTE — Progress Notes (Signed)
History of Present Illness  There is no documented history at this time  Assessments & Plan   There are no diagnoses linked to this encounter.    Additional instructions  Subjective:  Patient presents with venous ulcer of the Bilateral lower extremity.    Procedure:  3 layer unna wrap was placed Bilateral lower extremity.   Plan:   Follow up in one week.  

## 2020-10-11 ENCOUNTER — Encounter (INDEPENDENT_AMBULATORY_CARE_PROVIDER_SITE_OTHER): Payer: Self-pay | Admitting: Nurse Practitioner

## 2020-10-16 ENCOUNTER — Ambulatory Visit (INDEPENDENT_AMBULATORY_CARE_PROVIDER_SITE_OTHER): Payer: Medicare Other | Admitting: Nurse Practitioner

## 2020-10-16 ENCOUNTER — Other Ambulatory Visit: Payer: Self-pay

## 2020-10-16 VITALS — BP 116/69 | HR 106 | Ht 70.0 in | Wt 258.0 lb

## 2020-10-16 DIAGNOSIS — E1065 Type 1 diabetes mellitus with hyperglycemia: Secondary | ICD-10-CM | POA: Diagnosis not present

## 2020-10-16 DIAGNOSIS — E10649 Type 1 diabetes mellitus with hypoglycemia without coma: Secondary | ICD-10-CM | POA: Diagnosis not present

## 2020-10-16 DIAGNOSIS — E785 Hyperlipidemia, unspecified: Secondary | ICD-10-CM | POA: Diagnosis not present

## 2020-10-16 DIAGNOSIS — E1069 Type 1 diabetes mellitus with other specified complication: Secondary | ICD-10-CM | POA: Diagnosis not present

## 2020-10-16 DIAGNOSIS — E1159 Type 2 diabetes mellitus with other circulatory complications: Secondary | ICD-10-CM | POA: Diagnosis not present

## 2020-10-16 DIAGNOSIS — L97201 Non-pressure chronic ulcer of unspecified calf limited to breakdown of skin: Secondary | ICD-10-CM

## 2020-10-16 DIAGNOSIS — I152 Hypertension secondary to endocrine disorders: Secondary | ICD-10-CM | POA: Diagnosis not present

## 2020-10-16 NOTE — Progress Notes (Signed)
History of Present Illness  There is no documented history at this time  Assessments & Plan   There are no diagnoses linked to this encounter.    Additional instructions  Subjective:  Patient presents with venous ulcer of the Bilateral lower extremity.    Procedure:  3 layer unna wrap was placed Bilateral lower extremity.   Plan:   Follow up in one week.  

## 2020-10-20 ENCOUNTER — Encounter (INDEPENDENT_AMBULATORY_CARE_PROVIDER_SITE_OTHER): Payer: Self-pay | Admitting: Nurse Practitioner

## 2020-10-22 DIAGNOSIS — I495 Sick sinus syndrome: Secondary | ICD-10-CM | POA: Diagnosis not present

## 2020-10-23 ENCOUNTER — Ambulatory Visit (INDEPENDENT_AMBULATORY_CARE_PROVIDER_SITE_OTHER): Payer: Medicare Other | Admitting: Nurse Practitioner

## 2020-10-23 ENCOUNTER — Other Ambulatory Visit: Payer: Self-pay

## 2020-10-23 ENCOUNTER — Encounter (INDEPENDENT_AMBULATORY_CARE_PROVIDER_SITE_OTHER): Payer: Self-pay | Admitting: Nurse Practitioner

## 2020-10-23 VITALS — BP 123/74 | HR 83 | Resp 16 | Ht 70.0 in | Wt 256.0 lb

## 2020-10-23 DIAGNOSIS — L97201 Non-pressure chronic ulcer of unspecified calf limited to breakdown of skin: Secondary | ICD-10-CM | POA: Diagnosis not present

## 2020-10-23 NOTE — Progress Notes (Signed)
History of Present Illness  There is no documented history at this time  Assessments & Plan   There are no diagnoses linked to this encounter.    Additional instructions  Subjective:  Patient presents with venous ulcer of the Bilateral lower extremity.    Procedure:  3 layer unna wrap was placed Bilateral lower extremity.   Plan:   Follow up in one week.  

## 2020-10-24 ENCOUNTER — Encounter: Payer: Self-pay | Admitting: Internal Medicine

## 2020-10-24 ENCOUNTER — Ambulatory Visit (INDEPENDENT_AMBULATORY_CARE_PROVIDER_SITE_OTHER): Payer: Medicare Other | Admitting: Internal Medicine

## 2020-10-24 ENCOUNTER — Other Ambulatory Visit: Payer: Self-pay

## 2020-10-24 VITALS — BP 118/68 | HR 90 | Temp 98.7°F | Resp 16 | Ht 70.0 in | Wt 257.2 lb

## 2020-10-24 DIAGNOSIS — I428 Other cardiomyopathies: Secondary | ICD-10-CM

## 2020-10-24 DIAGNOSIS — I2510 Atherosclerotic heart disease of native coronary artery without angina pectoris: Secondary | ICD-10-CM

## 2020-10-24 DIAGNOSIS — E876 Hypokalemia: Secondary | ICD-10-CM | POA: Diagnosis not present

## 2020-10-24 DIAGNOSIS — I495 Sick sinus syndrome: Secondary | ICD-10-CM

## 2020-10-24 DIAGNOSIS — I5022 Chronic systolic (congestive) heart failure: Secondary | ICD-10-CM | POA: Diagnosis not present

## 2020-10-24 DIAGNOSIS — C8318 Mantle cell lymphoma, lymph nodes of multiple sites: Secondary | ICD-10-CM | POA: Diagnosis not present

## 2020-10-24 DIAGNOSIS — E7800 Pure hypercholesterolemia, unspecified: Secondary | ICD-10-CM | POA: Diagnosis not present

## 2020-10-24 DIAGNOSIS — D649 Anemia, unspecified: Secondary | ICD-10-CM | POA: Diagnosis not present

## 2020-10-24 DIAGNOSIS — F439 Reaction to severe stress, unspecified: Secondary | ICD-10-CM

## 2020-10-24 DIAGNOSIS — R600 Localized edema: Secondary | ICD-10-CM | POA: Diagnosis not present

## 2020-10-24 DIAGNOSIS — L97201 Non-pressure chronic ulcer of unspecified calf limited to breakdown of skin: Secondary | ICD-10-CM

## 2020-10-24 DIAGNOSIS — D509 Iron deficiency anemia, unspecified: Secondary | ICD-10-CM

## 2020-10-24 DIAGNOSIS — I10 Essential (primary) hypertension: Secondary | ICD-10-CM

## 2020-10-24 DIAGNOSIS — I429 Cardiomyopathy, unspecified: Secondary | ICD-10-CM

## 2020-10-24 DIAGNOSIS — E1151 Type 2 diabetes mellitus with diabetic peripheral angiopathy without gangrene: Secondary | ICD-10-CM | POA: Diagnosis not present

## 2020-10-24 DIAGNOSIS — N1832 Chronic kidney disease, stage 3b: Secondary | ICD-10-CM

## 2020-10-24 DIAGNOSIS — D61818 Other pancytopenia: Secondary | ICD-10-CM

## 2020-10-24 NOTE — Progress Notes (Signed)
[name], male   DOB: May 27, 1946, 75 y.o.   MRN: [id number]   Subjective:    [name], male    DOB: [birth year], 75 y.o.   MRN: [id number]  HPI This visit occurred during the SARS-CoV-2 public health emergency.  Safety protocols were in place, including screening questions prior to the visit, additional usage of staff PPE, and extensive cleaning of exam room while observing appropriate contact time as indicated for disinfecting solutions.  Patient here for a scheduled follow up.  Here to follow up regarding his blood pressure, blood sugar, cholesterol.  Increased stress. Wife recently passed away.  Discussed with him today.  Feels down.  Does not feel needs any further intervention.  No suicidal ideations.  No chest pain.  Breathing overall stable.  States he has stopped his medication.  Off levothyroxine, potassium, spironolactone and iron.  No increased cough or congestion.  No vomiting.  Bowels moving.  Taking gabapentin.  Helps his hands.     Past Medical History:  Diagnosis Date  . CHF (congestive heart failure) ([place])   . Depression   . 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 Gastroparesis   . Graves disease   . Hypercholesterolemia   . Hypertension   . Mantle cell lymphoma ([place]) 07/2014  . Peripheral neuropathy   . Shingles    Past Surgical History:  Procedure Laterality Date  . CHOLECYSTECTOMY    . NEPHRECTOMY     right  . PACEMAKER INSERTION N/A 12/07/2017   Procedure: INSERTION PACEMAKER DUAL CHAMBER INITIAL INSERT;  Surgeon: Isaias Cowman, MD;  Location: [facility];  Service: Cardiovascular;  Laterality: N/A;  . PORTA CATH INSERTION N/A 11/03/2017   Procedure: PORTA CATH INSERTION;  Surgeon: Katha Cabal, MD;  Location: [place];  Service: Cardiovascular;  Laterality: N/A;   Family History  Problem Relation Age of Onset  . Breast cancer Mother   . Diabetes Father    . Cancer Father        Lymphoma  . Alcohol abuse Maternal Uncle   . Diabetes Sister   . Heart disease Other        grandfather   Social History   Socioeconomic History  . Marital status: Married    Spouse name: Not on file  . Number of children: 1  . Years of education: Not on file  . Highest education level: Not on file  Occupational History  . Not on file  Tobacco Use  . Smoking status: Former Research scientist (life sciences)  . Smokeless tobacco: Current User    Types: Chew  Vaping Use  . Vaping Use: Never used  Substance and Sexual Activity  . Alcohol use: Yes    Alcohol/week: 3.0 standard drinks    Types: 3 Cans of beer per week    Comment: nightly  . Drug use: Never  . Sexual activity: Not Currently  Other Topics Concern  . Not on file  Social History Narrative  . Not on file   Social Determinants of Health   Financial Resource Strain: Not on file  Food Insecurity: Not on file  Transportation Needs: Not on file  Physical Activity: Not on file  Stress: Not on file  Social Connections: Not on file    Outpatient Encounter Medications as of 10/24/2020  Medication Sig  . ACCU-CHEK AVIVA PLUS test strip USE AS INSTRUCTED. USE 1 STRIP EVERY 3 HOURS . ACCU-CHECK AVIVA PLUS  TEST STRIP. DX P80.998  . acetaminophen (TYLENOL) 500 MG tablet Take 500 mg by mouth every 6 (six) hours as needed.   . carvedilol (COREG) 3.125 MG tablet Take 1 tablet by mouth 2 (two) times daily with a meal.  . furosemide (LASIX) 20 MG tablet Take 40 mg by mouth 2 (two) times daily. 2 tablets in the morning 1 tablet in the afternoon as needed  . gabapentin (NEURONTIN) 100 MG capsule Take by mouth.  Marland Kitchen GLUCAGON EMERGENCY 1 MG injection   . glucagon, human recombinant, (GLUCAGEN) 1 MG injection Use as directed  . glucose 4 GM chewable tablet Chew 1 tablet by mouth once as needed for low blood sugar.  Marland Kitchen glucose blood test strip Use 8 (eight) times daily as directed. BAYER CONTOUR NEXT TEST STRIPS E10.649  . insulin  aspart (NOVOLOG) 100 UNIT/ML injection Basal rate 12am 0.8 units per hour, 9am 0.9 units per hour. (total basal insulin 20.7 units). Carbohydrate ratio 1 units for every 8gm of carbohydrate. Correction factor 1 units for every 50mg /dl over target cbg. Target CBG 80-120  . lisinopril (ZESTRIL) 20 MG tablet Take by mouth.  . pravastatin (PRAVACHOL) 40 MG tablet Take 40 mg by mouth daily.   . prochlorperazine (COMPAZINE) 10 MG tablet TAKE 1 TABLET (10 MG TOTAL) BY MOUTH EVERY 6 (SIX) HOURS AS NEEDED (NAUSEA OR VOMITING).  . vitamin B-12 (CYANOCOBALAMIN) 250 MCG tablet Take 250 mcg by mouth daily.  . Vitamin D, Cholecalciferol, 10 MCG (400 UNIT) TABS Take by mouth.  . [DISCONTINUED] carvedilol (COREG) 3.125 MG tablet Take 3.125 mg by mouth 2 (two) times daily with a meal.   . [DISCONTINUED] cephALEXin (KEFLEX) 500 MG capsule Take 500 mg by mouth 2 (two) times daily.  . [DISCONTINUED] cyanocobalamin (,VITAMIN B-12,) 1000 MCG/ML injection Inject into the muscle.  . [DISCONTINUED] ferrous sulfate 325 (65 FE) MG tablet Take 325 mg by mouth daily.  . [DISCONTINUED] levothyroxine (SYNTHROID) 25 MCG tablet PLEASE SEE ATTACHED FOR DETAILED DIRECTIONS  . [DISCONTINUED] nystatin cream (MYCOSTATIN) APPLY TO AFFECTED AREA TWICE A DAY  . [DISCONTINUED] potassium chloride (KLOR-CON) 10 MEQ tablet TAKE 1 TABLET BY MOUTH TWICE DAILY FOR 3 DAYS THEN CONTINUE ONCE DAILY.  . [DISCONTINUED] spironolactone (ALDACTONE) 25 MG tablet Take by mouth.  . [DISCONTINUED] valACYclovir (VALTREX) 1000 MG tablet Take 1 tablet (1,000 mg total) by mouth 3 (three) times daily.   Facility-Administered Encounter Medications as of 10/24/2020  Medication  . heparin lock flush 100 unit/mL  . sodium chloride flush (NS) 0.9 % injection 10 mL  . sodium chloride flush (NS) 0.9 % injection 10 mL    Review of Systems  Constitutional: Negative for appetite change and unexpected weight change.  HENT: Negative for congestion and sinus pressure.    Respiratory: Negative for cough and chest tightness.        Breathing stable.   Cardiovascular: Negative for chest pain and palpitations.       Legs wrapped.  Sees vascular surgery regularly to have them wrapped.   Gastrointestinal: Negative for abdominal pain, diarrhea, nausea and vomiting.  Genitourinary: Negative for difficulty urinating and dysuria.  Musculoskeletal:       Gabapentin helps hands.    Skin: Negative for color change and rash.  Neurological: Negative for dizziness, light-headedness and headaches.  Psychiatric/Behavioral: Negative for agitation.       Increased stress and some reactive depression.  Wife recently passed.         Objective:    Physical Exam  Vitals reviewed.  Constitutional:      General: He is not in acute distress.    Appearance: Normal appearance. He is well-developed and well-nourished.  HENT:     Head: Normocephalic and atraumatic.     Right Ear: External ear normal.     Left Ear: External ear normal.  Eyes:     General: No scleral icterus.       Right eye: No discharge.        Left eye: No discharge.     Conjunctiva/sclera: Conjunctivae normal.  Cardiovascular:     Rate and Rhythm: Normal rate and regular rhythm.  Pulmonary:     Effort: Pulmonary effort is normal. No respiratory distress.     Breath sounds: Normal breath sounds.  Abdominal:     General: Bowel sounds are normal.     Palpations: Abdomen is soft.     Tenderness: There is no abdominal tenderness.  Musculoskeletal:        General: No tenderness or edema.     Comments: Lower extremities - wrapped.   Skin:    Findings: No erythema or rash.  Neurological:     Mental Status: He is alert.  Psychiatric:        Mood and Affect: Mood and affect normal.        Behavior: Behavior normal.     BP 118/68   Pulse 90   Temp 98.7 F (37.1 C) (Oral)   Resp 16   Ht 5\' 10"  (1.778 m)   Wt 257 lb 3.2 oz (116.7 kg)   SpO2 98%   BMI 36.90 kg/m  Wt Readings from Last 3  Encounters:  10/30/20 255 lb (115.7 kg)  10/24/20 257 lb 3.2 oz (116.7 kg)  10/23/20 256 lb (116.1 kg)     Lab Results  Component Value Date   WBC 6.1 07/29/2020   HGB 13.9 07/29/2020   HCT 40.0 07/29/2020   PLT 102 (L) 07/29/2020   GLUCOSE 117 (H) 10/24/2020   CHOL 132 06/24/2020   TRIG 61.0 06/24/2020   HDL 51.50 06/24/2020   LDLCALC 68 06/24/2020   ALT 16 07/29/2020   AST 24 07/29/2020   NA 143 10/24/2020   K 3.6 10/24/2020   CL 102 10/24/2020   CREATININE 1.40 10/24/2020   BUN 13 10/24/2020   CO2 31 10/24/2020   TSH 0.94 10/24/2020   INR 1.12 07/14/2018   HGBA1C 6.8 (H) 06/24/2020   MICROALBUR 1.9 09/22/2016    CT ABDOMEN PELVIS WO CONTRAST  Result Date: 07/29/2020 CLINICAL DATA:  Shortness of breath for years. Chest pain with activity, no history of COVID EXAM: CT CHEST, ABDOMEN AND PELVIS WITHOUT CONTRAST TECHNIQUE: Multidetector CT imaging of the chest, abdomen and pelvis was performed following the standard protocol without IV contrast. COMPARISON:  January 24, 2020 FINDINGS: CT CHEST FINDINGS Cardiovascular: Dual lead pacer device in situ as well as RIGHT-sided Port-A-Cath. Port-A-Cath terminates at the caval to atrial junction. Normal caliber thoracic aorta. Calcified coronary artery disease, three-vessel coronary artery disease. No pericardial effusion. Mediastinum/Nodes: Esophagus grossly normal. No axillary lymphadenopathy. No mediastinal lymphadenopathy. No hilar lymphadenopathy. Lungs/Pleura: Mild subpleural reticulation. Mild centrilobular emphysema. No suspicious pulmonary mass. No consolidation. No pleural effusion. Airways are patent. Basilar grouped nodules with post infectious or inflammatory appearance not changed since previous examinations. Musculoskeletal: Signs of previous RIGHT humeral fracture as before. Rib deformities along the bilateral anterolateral chest compatible with prior rib fractures worse on the RIGHT compatible with chronic findings. Not  changed  from previous imaging. CT ABDOMEN PELVIS FINDINGS Hepatobiliary: No focal, suspicious hepatic lesion. Post cholecystectomy. No gross biliary duct distension. Pancreas: Pancreas with signs of atrophy. No ductal dilation or sign of inflammation. Spleen: Spleen normal in size and contour. Adrenals/Urinary Tract: Adrenal glands are normal. Post RIGHT nephrectomy. Mild cortical scarring of the LEFT kidney with small cyst arising from the lateral inferior aspect of the kidney not changed from previous imaging. Urinary bladder under distended limiting assessment. Stomach/Bowel: No acute gastrointestinal process.  Normal appendix. Vascular/Lymphatic: Scattered atheromatous plaque with calcification in the abdominal aorta. There is no gastrohepatic or hepatoduodenal ligament lymphadenopathy. No retroperitoneal or mesenteric lymphadenopathy. 10 mm LEFT common iliac lymph node, short axis measurement is within 1 mm of its previous size. No pelvic sidewall lymphadenopathy. Reproductive: Prostate with heterogeneity, nonspecific on CT. Other: No ascites. Musculoskeletal: Spinal degenerative changes. No acute or destructive bone process. IMPRESSION: 1. No acute findings in the chest, abdomen or pelvis. No new or suspicious findings. 2. Mild centrilobular emphysema and subpleural reticulation as before. 3. Calcified coronary artery disease, three-vessel coronary artery disease. 4. Post RIGHT nephrectomy. 5. Emphysema and aortic atherosclerosis. Aortic Atherosclerosis (ICD10-I70.0) and Emphysema (ICD10-J43.9). Electronically Signed   By: Zetta Bills M.D.   On: 07/29/2020 14:52   CT CHEST WO CONTRAST  Result Date: 07/29/2020 CLINICAL DATA:  Shortness of breath for years. Chest pain with activity, no history of COVID EXAM: CT CHEST, ABDOMEN AND PELVIS WITHOUT CONTRAST TECHNIQUE: Multidetector CT imaging of the chest, abdomen and pelvis was performed following the standard protocol without IV contrast. COMPARISON:   January 24, 2020 FINDINGS: CT CHEST FINDINGS Cardiovascular: Dual lead pacer device in situ as well as RIGHT-sided Port-A-Cath. Port-A-Cath terminates at the caval to atrial junction. Normal caliber thoracic aorta. Calcified coronary artery disease, three-vessel coronary artery disease. No pericardial effusion. Mediastinum/Nodes: Esophagus grossly normal. No axillary lymphadenopathy. No mediastinal lymphadenopathy. No hilar lymphadenopathy. Lungs/Pleura: Mild subpleural reticulation. Mild centrilobular emphysema. No suspicious pulmonary mass. No consolidation. No pleural effusion. Airways are patent. Basilar grouped nodules with post infectious or inflammatory appearance not changed since previous examinations. Musculoskeletal: Signs of previous RIGHT humeral fracture as before. Rib deformities along the bilateral anterolateral chest compatible with prior rib fractures worse on the RIGHT compatible with chronic findings. Not changed from previous imaging. CT ABDOMEN PELVIS FINDINGS Hepatobiliary: No focal, suspicious hepatic lesion. Post cholecystectomy. No gross biliary duct distension. Pancreas: Pancreas with signs of atrophy. No ductal dilation or sign of inflammation. Spleen: Spleen normal in size and contour. Adrenals/Urinary Tract: Adrenal glands are normal. Post RIGHT nephrectomy. Mild cortical scarring of the LEFT kidney with small cyst arising from the lateral inferior aspect of the kidney not changed from previous imaging. Urinary bladder under distended limiting assessment. Stomach/Bowel: No acute gastrointestinal process.  Normal appendix. Vascular/Lymphatic: Scattered atheromatous plaque with calcification in the abdominal aorta. There is no gastrohepatic or hepatoduodenal ligament lymphadenopathy. No retroperitoneal or mesenteric lymphadenopathy. 10 mm LEFT common iliac lymph node, short axis measurement is within 1 mm of its previous size. No pelvic sidewall lymphadenopathy. Reproductive: Prostate with  heterogeneity, nonspecific on CT. Other: No ascites. Musculoskeletal: Spinal degenerative changes. No acute or destructive bone process. IMPRESSION: 1. No acute findings in the chest, abdomen or pelvis. No new or suspicious findings. 2. Mild centrilobular emphysema and subpleural reticulation as before. 3. Calcified coronary artery disease, three-vessel coronary artery disease. 4. Post RIGHT nephrectomy. 5. Emphysema and aortic atherosclerosis. Aortic Atherosclerosis (ICD10-I70.0) and Emphysema (ICD10-J43.9). Electronically Signed   By: Zetta Bills  M.D.   On: 07/29/2020 14:52       Assessment & Plan:   Problem List Items Addressed This Visit    Anemia    Follow cbc.       CAD in native artery    Continue risk factor modification.  Followed by cardiology.       Cardiomyopathy, idiopathic (HCC)    On lasix.  Apparently off spironolactone.  Need to clarify if taking. Weight is stable.  No increased sob.  Check metabolic panel.        Chronic systolic CHF (congestive heart failure) (HCC)    Breathing stable.  On lasix.  No increased sob.  Report of being off spironolactone.  Need to clarify.  Check metabolic panel.        CKD (chronic kidney disease) stage 3, GFR 30-59 ml/min (HCC)    Has been followed by nephrology.  Avoid antiinflammatories.  Recheck metabolic panel today.       Diabetes [facility]) - Primary    Seeing endocrinology.  No significant lows per his reported.  Discussed importance of eating regular meals.  Follow.       Relevant Orders   TSH (Completed)   Edema of lower extremity    Being followed by [facility].  Legs wrapped.  Follow.       Essential hypertension, benign    Blood pressure doing well.  On coreg and lasix.  Follow pressures.  Follow metabolic panel.        Hypercholesterolemia    On pravastatin.  Follow lipid panel and liver function tests.        Hypokalemia   Relevant Orders   Basic metabolic panel (Completed)   Iron deficiency anemia    Per  report, not taking iron.  Sees Dr [name].  Follow cbc and iron studies.       Lower limb ulcer, calf ([place])    Being followed by vascular surgery.  Legs wrapped.  Followed weekly.  Follow.       Mantle cell lymphoma ([place])    Followed by oncology.  Note reviewed.       Pancytopenia ([place])    Followed by oncology.       Sick sinus syndrome [facility])    S/p pacemaker placement.        Stress    Increased stress.  Wife recently passed.  Follow.            [name], MD

## 2020-10-25 LAB — BASIC METABOLIC PANEL
BUN: 13 mg/dL (ref 6–23)
CO2: 31 mEq/L (ref 19–32)
Calcium: 7.9 mg/dL — ABNORMAL LOW (ref 8.4–10.5)
Chloride: 102 mEq/L (ref 96–112)
Creatinine, Ser: 1.4 mg/dL (ref 0.40–1.50)
GFR: 50.29 mL/min — ABNORMAL LOW (ref >60.00)
Glucose, Bld: 117 mg/dL — ABNORMAL HIGH (ref 70–99)
Potassium: 3.6 mEq/L (ref 3.5–5.1)
Sodium: 143 mEq/L (ref 135–145)

## 2020-10-25 LAB — TSH: TSH: 0.94 u[IU]/mL (ref 0.35–4.50)

## 2020-10-28 ENCOUNTER — Encounter: Payer: Self-pay | Admitting: *Deleted

## 2020-10-30 ENCOUNTER — Other Ambulatory Visit: Payer: Self-pay

## 2020-10-30 ENCOUNTER — Ambulatory Visit (INDEPENDENT_AMBULATORY_CARE_PROVIDER_SITE_OTHER): Payer: Medicare Other | Admitting: Nurse Practitioner

## 2020-10-30 ENCOUNTER — Encounter (INDEPENDENT_AMBULATORY_CARE_PROVIDER_SITE_OTHER): Payer: Self-pay

## 2020-10-30 VITALS — BP 108/71 | HR 101 | Resp 16 | Wt 255.0 lb

## 2020-10-30 DIAGNOSIS — L97201 Non-pressure chronic ulcer of unspecified calf limited to breakdown of skin: Secondary | ICD-10-CM

## 2020-10-30 NOTE — Progress Notes (Signed)
History of Present Illness  There is no documented history at this time  Assessments & Plan   There are no diagnoses linked to this encounter.    Additional instructions  Subjective:  Patient presents with venous ulcer of the Bilateral lower extremity.    Procedure:  3 layer unna wrap was placed Bilateral lower extremity.   Plan:   Follow up in one week.  

## 2020-11-03 ENCOUNTER — Encounter: Payer: Self-pay | Admitting: Internal Medicine

## 2020-11-03 NOTE — Assessment & Plan Note (Signed)
Per report, not taking iron.  Sees Dr [name].  Follow cbc and iron studies.

## 2020-11-03 NOTE — Assessment & Plan Note (Signed)
Has been followed by nephrology.  Avoid antiinflammatories.  Recheck metabolic panel today.

## 2020-11-03 NOTE — Assessment & Plan Note (Signed)
Blood pressure doing well.  On coreg and lasix.  Follow pressures.  Follow metabolic panel.

## 2020-11-03 NOTE — Assessment & Plan Note (Signed)
Being followed by [facility].  Legs wrapped.  Follow.

## 2020-11-03 NOTE — Assessment & Plan Note (Signed)
On lasix.  Apparently off spironolactone.  Need to clarify if taking. Weight is stable.  No increased sob.  Check metabolic panel.

## 2020-11-03 NOTE — Assessment & Plan Note (Signed)
Followed by oncology.  Note reviewed.

## 2020-11-03 NOTE — Assessment & Plan Note (Signed)
Continue risk factor modification.  Followed by cardiology.  

## 2020-11-03 NOTE — Assessment & Plan Note (Signed)
Follow [facility].  

## 2020-11-03 NOTE — Assessment & Plan Note (Signed)
On pravastatin.  Follow lipid panel and liver function tests.   

## 2020-11-03 NOTE — Assessment & Plan Note (Signed)
Increased stress.  Wife recently passed.  Follow.

## 2020-11-03 NOTE — Assessment & Plan Note (Signed)
Breathing stable.  On lasix.  No increased sob.  Report of being off spironolactone.  Need to clarify.  Check metabolic panel.

## 2020-11-03 NOTE — Assessment & Plan Note (Signed)
Seeing endocrinology.  No significant lows per his reported.  Discussed importance of eating regular meals.  Follow.

## 2020-11-03 NOTE — Assessment & Plan Note (Signed)
Followed by oncology 

## 2020-11-03 NOTE — Assessment & Plan Note (Signed)
S/p pacemaker placement.

## 2020-11-03 NOTE — Assessment & Plan Note (Signed)
Being followed by vascular surgery.  Legs wrapped.  Followed weekly.  Follow.

## 2020-11-04 ENCOUNTER — Other Ambulatory Visit: Payer: Self-pay | Admitting: Internal Medicine

## 2020-11-04 ENCOUNTER — Encounter: Payer: Self-pay | Admitting: Internal Medicine

## 2020-11-04 ENCOUNTER — Encounter (INDEPENDENT_AMBULATORY_CARE_PROVIDER_SITE_OTHER): Payer: Self-pay | Admitting: Nurse Practitioner

## 2020-11-06 ENCOUNTER — Other Ambulatory Visit: Payer: Self-pay

## 2020-11-06 ENCOUNTER — Ambulatory Visit (INDEPENDENT_AMBULATORY_CARE_PROVIDER_SITE_OTHER): Payer: Medicare Other | Admitting: Nurse Practitioner

## 2020-11-06 ENCOUNTER — Encounter (INDEPENDENT_AMBULATORY_CARE_PROVIDER_SITE_OTHER): Payer: Self-pay | Admitting: Nurse Practitioner

## 2020-11-06 VITALS — BP 110/72 | HR 79 | Ht 69.0 in | Wt 251.0 lb

## 2020-11-06 DIAGNOSIS — L97201 Non-pressure chronic ulcer of unspecified calf limited to breakdown of skin: Secondary | ICD-10-CM | POA: Diagnosis not present

## 2020-11-06 MED ORDER — GABAPENTIN 100 MG PO CAPS: 100.0000 mg | ORAL_CAPSULE | Freq: Every day | ORAL | 2 refills | Status: DC

## 2020-11-06 MED ORDER — NYSTATIN 100000 UNIT/GM EX CREA: 1.0000 "application " | TOPICAL_CREAM | Freq: Two times a day (BID) | CUTANEOUS | 0 refills | Status: DC

## 2020-11-06 MED ORDER — POTASSIUM CHLORIDE ER 10 MEQ PO TBCR: 10.0000 meq | EXTENDED_RELEASE_TABLET | Freq: Every day | ORAL | 0 refills | Status: DC

## 2020-11-06 NOTE — Telephone Encounter (Signed)
He was off of the potassium and could not tell me how long. His labs came back normal. Does he need to restart potassium? Regarding gabapentin- originally given to him for shingles at the walk in but he has been taking for the pain in his hands. He has been taking 100 mg qhs. Are you okay for me to send that in for him? I will send in his nystatin cream

## 2020-11-06 NOTE — Progress Notes (Signed)
History of Present Illness  There is no documented history at this time  Assessments & Plan   There are no diagnoses linked to this encounter.    Additional instructions  Subjective:  Patient presents with venous ulcer of the Bilateral lower extremity.    Procedure:  3 layer unna wrap was placed Bilateral lower extremity.   Plan:   Follow up in one week.  

## 2020-11-06 NOTE — Telephone Encounter (Signed)
Please clarify with pharmacy when he last got potassium refilled (because if he is taking lasix, he may need to stay on the potassium).  I think cardiology has refilled some of his medication.  If has been taking then ok to refill.  If has no documentation of getting refilled any time recently, then remain off and recheck met b in one week.

## 2020-11-06 NOTE — Telephone Encounter (Signed)
He was off the potassium for about 2 weeks. Found a bottle and started taking10 meq q day. I have sent in a 30 day supply for him to continue. Will schedule lab appt in 2 weeks.

## 2020-11-07 ENCOUNTER — Inpatient Hospital Stay: Payer: Medicare Other | Attending: Oncology

## 2020-11-07 DIAGNOSIS — C8310 Mantle cell lymphoma, unspecified site: Secondary | ICD-10-CM | POA: Diagnosis not present

## 2020-11-07 DIAGNOSIS — Z452 Encounter for adjustment and management of vascular access device: Secondary | ICD-10-CM | POA: Insufficient documentation

## 2020-11-07 DIAGNOSIS — Z95828 Presence of other vascular implants and grafts: Secondary | ICD-10-CM

## 2020-11-07 MED ORDER — HEPARIN SOD (PORK) LOCK FLUSH 100 UNIT/ML IV SOLN
INTRAVENOUS | Status: AC
  Filled 2020-11-07: qty 5

## 2020-11-07 MED ORDER — HEPARIN SOD (PORK) LOCK FLUSH 100 UNIT/ML IV SOLN
500.0000 [IU] | Freq: Once | INTRAVENOUS | Status: AC
  Administered 2020-11-07: 500 [IU] via INTRAVENOUS
  Filled 2020-11-07: qty 5

## 2020-11-07 MED ORDER — SODIUM CHLORIDE 0.9% FLUSH
10.0000 mL | INTRAVENOUS | Status: DC | PRN
  Administered 2020-11-07: 10 mL via INTRAVENOUS
  Filled 2020-11-07: qty 10

## 2020-11-10 ENCOUNTER — Encounter: Payer: Self-pay | Admitting: Internal Medicine

## 2020-11-10 DIAGNOSIS — W548XXA Other contact with dog, initial encounter: Secondary | ICD-10-CM | POA: Diagnosis not present

## 2020-11-10 DIAGNOSIS — S6991XA Unspecified injury of right wrist, hand and finger(s), initial encounter: Secondary | ICD-10-CM | POA: Diagnosis not present

## 2020-11-10 DIAGNOSIS — S63124A Dislocation of unspecified interphalangeal joint of right thumb, initial encounter: Secondary | ICD-10-CM | POA: Diagnosis not present

## 2020-11-10 DIAGNOSIS — S63114A Dislocation of metacarpophalangeal joint of right thumb, initial encounter: Secondary | ICD-10-CM | POA: Diagnosis not present

## 2020-11-11 NOTE — Telephone Encounter (Signed)
Reviewed acute care note.  Need emerge note.  He did hit his head. Please call and confirm he is doing ok.

## 2020-11-11 NOTE — Telephone Encounter (Signed)
LMTCB

## 2020-11-12 DIAGNOSIS — S63104D Unspecified dislocation of right thumb, subsequent encounter: Secondary | ICD-10-CM | POA: Diagnosis not present

## 2020-11-12 NOTE — Telephone Encounter (Signed)
Spoke with pt. He saw ortho today. He is doing ok as far as the fall. Struggling with depression. He lost his wife in November. Lost his car keys today. He would like to speak with [name] about being put back on prozac again. Patient confirmed he is not having any harmful thoughts about himself of hurting others. I have scheduled him for a VV Friday with [name]

## 2020-11-13 ENCOUNTER — Ambulatory Visit (INDEPENDENT_AMBULATORY_CARE_PROVIDER_SITE_OTHER): Payer: Medicare Other | Admitting: Nurse Practitioner

## 2020-11-14 DIAGNOSIS — S63104D Unspecified dislocation of right thumb, subsequent encounter: Secondary | ICD-10-CM | POA: Diagnosis not present

## 2020-11-15 ENCOUNTER — Telehealth: Payer: Medicare Other | Admitting: Internal Medicine

## 2020-11-19 ENCOUNTER — Ambulatory Visit (INDEPENDENT_AMBULATORY_CARE_PROVIDER_SITE_OTHER): Payer: Medicare Other | Admitting: Nurse Practitioner

## 2020-11-19 ENCOUNTER — Other Ambulatory Visit: Payer: Self-pay

## 2020-11-19 VITALS — BP 126/77 | HR 86 | Ht 70.0 in | Wt 255.0 lb

## 2020-11-19 DIAGNOSIS — I10 Essential (primary) hypertension: Secondary | ICD-10-CM | POA: Diagnosis not present

## 2020-11-19 DIAGNOSIS — S63104D Unspecified dislocation of right thumb, subsequent encounter: Secondary | ICD-10-CM | POA: Diagnosis not present

## 2020-11-19 DIAGNOSIS — L97201 Non-pressure chronic ulcer of unspecified calf limited to breakdown of skin: Secondary | ICD-10-CM | POA: Diagnosis not present

## 2020-11-19 DIAGNOSIS — I890 Lymphedema, not elsewhere classified: Secondary | ICD-10-CM | POA: Diagnosis not present

## 2020-11-19 DIAGNOSIS — E7800 Pure hypercholesterolemia, unspecified: Secondary | ICD-10-CM | POA: Diagnosis not present

## 2020-11-21 ENCOUNTER — Emergency Department
Admission: EM | Admit: 2020-11-21 | Discharge: 2020-11-21 | Disposition: A | Discharge status: Home / Self Care | Payer: No Typology Code available for payment source | Attending: Emergency Medicine | Admitting: Emergency Medicine

## 2020-11-21 ENCOUNTER — Other Ambulatory Visit: Payer: Self-pay

## 2020-11-21 DIAGNOSIS — Z794 Long term (current) use of insulin: Secondary | ICD-10-CM | POA: Diagnosis not present

## 2020-11-21 DIAGNOSIS — R0902 Hypoxemia: Secondary | ICD-10-CM | POA: Diagnosis not present

## 2020-11-21 DIAGNOSIS — E161 Other hypoglycemia: Secondary | ICD-10-CM | POA: Diagnosis not present

## 2020-11-21 DIAGNOSIS — E079 Disorder of thyroid, unspecified: Secondary | ICD-10-CM | POA: Insufficient documentation

## 2020-11-21 DIAGNOSIS — I130 Hypertensive heart and chronic kidney disease with heart failure and stage 1 through stage 4 chronic kidney disease, or unspecified chronic kidney disease: Secondary | ICD-10-CM | POA: Insufficient documentation

## 2020-11-21 DIAGNOSIS — E10649 Type 1 diabetes mellitus with hypoglycemia without coma: Secondary | ICD-10-CM | POA: Diagnosis not present

## 2020-11-21 DIAGNOSIS — Z87891 Personal history of nicotine dependence: Secondary | ICD-10-CM | POA: Insufficient documentation

## 2020-11-21 DIAGNOSIS — S63104D Unspecified dislocation of right thumb, subsequent encounter: Secondary | ICD-10-CM | POA: Diagnosis not present

## 2020-11-21 DIAGNOSIS — E1122 Type 2 diabetes mellitus with diabetic chronic kidney disease: Secondary | ICD-10-CM | POA: Insufficient documentation

## 2020-11-21 DIAGNOSIS — R55 Syncope and collapse: Secondary | ICD-10-CM | POA: Diagnosis not present

## 2020-11-21 DIAGNOSIS — Z79899 Other long term (current) drug therapy: Secondary | ICD-10-CM | POA: Diagnosis not present

## 2020-11-21 DIAGNOSIS — R404 Transient alteration of awareness: Secondary | ICD-10-CM | POA: Diagnosis not present

## 2020-11-21 DIAGNOSIS — I2510 Atherosclerotic heart disease of native coronary artery without angina pectoris: Secondary | ICD-10-CM | POA: Diagnosis not present

## 2020-11-21 DIAGNOSIS — I5023 Acute on chronic systolic (congestive) heart failure: Secondary | ICD-10-CM | POA: Diagnosis not present

## 2020-11-21 DIAGNOSIS — N183 Chronic kidney disease, stage 3 unspecified: Secondary | ICD-10-CM | POA: Insufficient documentation

## 2020-11-21 DIAGNOSIS — E162 Hypoglycemia, unspecified: Secondary | ICD-10-CM | POA: Diagnosis not present

## 2020-11-21 DIAGNOSIS — T85694A Other mechanical complication of insulin pump, initial encounter: Secondary | ICD-10-CM | POA: Diagnosis not present

## 2020-11-21 DIAGNOSIS — E08311 Diabetes mellitus due to underlying condition with unspecified diabetic retinopathy with macular edema: Secondary | ICD-10-CM | POA: Diagnosis not present

## 2020-11-21 LAB — CBG MONITORING, ED
Glucose-Capillary: 74 mg/dL (ref 70–99)
Glucose-Capillary: 97 mg/dL (ref 70–99)
Glucose-Capillary: 216 mg/dL — ABNORMAL HIGH (ref 70–99)

## 2020-11-21 NOTE — ED Notes (Addendum)
Per Cheri Fowler, MD, this RN restarted EMS D10 at 107ml/hr on pt. Pt current CBG 74
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
## 2020-11-23 ENCOUNTER — Telehealth: Payer: Self-pay | Admitting: Internal Medicine
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
## 2020-11-24 ENCOUNTER — Encounter (INDEPENDENT_AMBULATORY_CARE_PROVIDER_SITE_OTHER): Payer: Self-pay | Admitting: Nurse Practitioner

## 2020-11-24 NOTE — Progress Notes (Signed)
Subjective:    [name], male    DOB: 07-20-1946, 75 y.o.   MRN: [id number] Chief Complaint  Patient presents with  . Follow-up    BIL unna boot check     The patient presents today for his Unna wrap evaluation.  The patient has went 2 weeks without the wrap changes due to having a traumatic fall.  The patient needed stitches in his right hand and required a brace.  Fortunately his injuries were more extensive.  Today the swelling of his lower extremities is much improved as is the quality of ulcerations.  The patient also reports that his fluid status seems to be doing well.  He denies any fever, chills, nausea vomiting or diarrhea.  He denies any shortness of breath.  Even the small wound on his toes are improved.   Review of Systems  Cardiovascular: Positive for leg swelling.  Musculoskeletal: Positive for arthralgias and gait problem.  Skin: Positive for wound.  All other systems reviewed and are negative.      Objective:   Physical Exam Vitals reviewed.  HENT:     Head: Normocephalic.  Cardiovascular:     Rate and Rhythm: Normal rate.     Pulses: Normal pulses.  Pulmonary:     Effort: Pulmonary effort is normal.  Neurological:     Mental Status: He is alert and oriented to person, place, and time.     Gait: Gait abnormal.  Psychiatric:        Mood and Affect: Mood normal.        Behavior: Behavior normal.        Thought Content: Thought content normal.        Judgment: Judgment normal.     BP 126/77   Pulse 86   Ht 5\' 10"  (1.778 m)   Wt 255 lb (115.7 kg)   BMI 36.59 kg/m   Past Medical History:  Diagnosis Date  . CHF (congestive heart failure) ([place])   . Depression   . 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 Gastroparesis   . Graves disease   . Hypercholesterolemia   . Hypertension   . Mantle cell lymphoma ([place]) 07/2014  . Peripheral neuropathy   . Shingles     Social History    Socioeconomic History  . Marital status: Married    Spouse name: Not on file  . Number of children: 1  . Years of education: Not on file  . Highest education level: Not on file  Occupational History  . Not on file  Tobacco Use  . Smoking status: Former Research scientist (life sciences)  . Smokeless tobacco: Current User    Types: Chew  Vaping Use  . Vaping Use: Never used  Substance and Sexual Activity  . Alcohol use: Yes    Alcohol/week: 3.0 standard drinks    Types: 3 Cans of beer per week    Comment: nightly  . Drug use: Never  . Sexual activity: Not Currently  Other Topics Concern  . Not on file  Social History Narrative  . Not on file   Social Determinants of Health   Financial Resource Strain: Not on file  Food Insecurity: Not on file  Transportation Needs: Not on file  Physical Activity: Not on file  Stress: Not on file  Social Connections: Not on file  Intimate Partner Violence: Not on file    Past Surgical History:  Procedure Laterality Date  .  CHOLECYSTECTOMY    . NEPHRECTOMY     right  . PACEMAKER INSERTION N/A 12/07/2017   Procedure: INSERTION PACEMAKER DUAL CHAMBER INITIAL INSERT;  Surgeon: Isaias Cowman, MD;  Location: [facility] ORS;  Service: Cardiovascular;  Laterality: N/A;  . PORTA CATH INSERTION N/A 11/03/2017   Procedure: PORTA CATH INSERTION;  Surgeon: Katha Cabal, MD;  Location: [place] CV LAB;  Service: Cardiovascular;  Laterality: N/A;    Family History  Problem Relation Age of Onset  . Breast cancer Mother   . Diabetes Father   . Cancer Father        Lymphoma  . Alcohol abuse Maternal Uncle   . Diabetes Sister   . Heart disease Other        grandfather    Allergies  Allergen Reactions  . Rofecoxib Nausea Only    CBC Latest Ref Rng & Units 07/29/2020 06/24/2020 01/24/2020  WBC 4.0 - 10.5 K/uL 6.1 6.3 5.7  Hemoglobin 13.0 - 17.0 g/dL 13.9 13.1 13.5  Hematocrit 39.0 - 52.0 % 40.0 39.0 40.8  Platelets 150 - 400 K/uL 102(L) 131.0(L) 118(L)       CMP     Component Value Date/Time   NA 143 10/24/2020 1455   NA 138 07/15/2014 0515   K 3.6 10/24/2020 1455   K 3.5 01/16/2015 1429   CL 102 10/24/2020 1455   CL 110 (H) 07/15/2014 0515   CO2 31 10/24/2020 1455   CO2 24 07/15/2014 0515   GLUCOSE 117 (H) 10/24/2020 1455   GLUCOSE 145 (H) 07/15/2014 0515   BUN 13 10/24/2020 1455   BUN 17 07/15/2014 0515   CREATININE 1.40 10/24/2020 1455   CREATININE 0.89 01/16/2015 1429   CALCIUM 7.9 (L) 10/24/2020 1455   CALCIUM 7.1 (L) 07/15/2014 0515   PROT 7.1 07/29/2020 0952   PROT 6.2 (L) 10/22/2014 1000   ALBUMIN 3.4 (L) 07/29/2020 0952   ALBUMIN 2.6 (L) 10/22/2014 1000   AST 24 07/29/2020 0952   AST 19 10/22/2014 1000   ALT 16 07/29/2020 0952   ALT 21 10/22/2014 1000   ALKPHOS 115 07/29/2020 0952   ALKPHOS 118 (H) 10/22/2014 1000   BILITOT 1.1 07/29/2020 0952   BILITOT 0.8 10/22/2014 1000   GFRNONAA 32 (L) 07/29/2020 0952   GFRNONAA >60 01/16/2015 1429   GFRAA 41 (L) 01/24/2020 0925   GFRAA >60 01/16/2015 1429     No results found.     Assessment & Plan:   1. Skin ulcer of calf, limited to breakdown of skin, unspecified laterality ([place]) No surgery or intervention at this point in time.    I have had a long discussion with the patient regarding venous insufficiency and why it  causes symptoms, specifically venous ulceration . I have discussed with the patient the chronic skin changes that accompany venous insufficiency and the long term sequela such as infection and recurring  ulceration.  Patient will be placed in [employer] which will be changed weekly drainage permitting.  In addition, behavioral modification including several periods of elevation of the lower extremities during the day will be continued. Achieving a position with the ankles at heart level was stressed to the patient  The patient is instructed to begin routine exercise, especially walking on a daily basis  Patient will continue to present to the  office on a weekly basis to have his Unna wraps changed.  2. Essential hypertension, benign Continue antihypertensive medications as already ordered, these medications have been reviewed and there are  no changes at this time.   3. Lymphedema The patient will continue with conservative therapy including elevation and exercise as able.  4. Hypercholesterolemia Continue statin as ordered and reviewed, no changes at this time    Current Outpatient Medications on File Prior to Visit  Medication Sig Dispense Refill  . ACCU-CHEK AVIVA PLUS test strip USE AS INSTRUCTED. USE 1 STRIP EVERY 3 HOURS . ACCU-CHECK AVIVA PLUS TEST STRIP. DX E10.649  12  . acetaminophen (TYLENOL) 500 MG tablet Take 500 mg by mouth every 6 (six) hours as needed.     . carvedilol (COREG) 3.125 MG tablet Take 1 tablet by mouth 2 (two) times daily with a meal.    . furosemide (LASIX) 20 MG tablet Take 40 mg by mouth 2 (two) times daily. 2 tablets in the morning 1 tablet in the afternoon as needed    . gabapentin (NEURONTIN) 100 MG capsule Take 1 capsule (100 mg total) by mouth at bedtime. 30 capsule 2  . GLUCAGON EMERGENCY 1 MG injection     . glucagon, human recombinant, (GLUCAGEN) 1 MG injection Use as directed    . glucose 4 GM chewable tablet Chew 1 tablet by mouth once as needed for low blood sugar.    Marland Kitchen glucose blood test strip Use 8 (eight) times daily as directed. BAYER CONTOUR NEXT TEST STRIPS E10.649    . insulin aspart (NOVOLOG) 100 UNIT/ML injection Basal rate 12am 0.8 units per hour, 9am 0.9 units per hour. (total basal insulin 20.7 units). Carbohydrate ratio 1 units for every 8gm of carbohydrate. Correction factor 1 units for every 50mg /dl over target cbg. Target CBG 80-120 1 vial 12  . lisinopril (ZESTRIL) 20 MG tablet Take by mouth.    . nystatin cream (MYCOSTATIN) Apply 1 application topically 2 (two) times daily. 30 g 0  . potassium chloride (KLOR-CON) 10 MEQ tablet Take 1 tablet (10 mEq total) by mouth  daily. 30 tablet 0  . pravastatin (PRAVACHOL) 40 MG tablet Take 40 mg by mouth daily.     . prochlorperazine (COMPAZINE) 10 MG tablet TAKE 1 TABLET (10 MG TOTAL) BY MOUTH EVERY 6 (SIX) HOURS AS NEEDED (NAUSEA OR VOMITING).    . vitamin B-12 (CYANOCOBALAMIN) 250 MCG tablet Take 250 mcg by mouth daily.    . Vitamin D, Cholecalciferol, 10 MCG (400 UNIT) TABS Take by mouth.    . cefdinir (OMNICEF) 300 MG capsule TAKE 1 CAPSULE BY MOUTH 2 TIMES DAILY FOR 7 DAYS.     Current Facility-Administered Medications on File Prior to Visit  Medication Dose Route Frequency Provider Last Rate Last Admin  . heparin lock flush 100 unit/mL  500 Units Intravenous Once Lloyd Huger, MD      . sodium chloride flush (NS) 0.9 % injection 10 mL  10 mL Intravenous PRN Lloyd Huger, MD   10 mL at 02/01/18 0829  . sodium chloride flush (NS) 0.9 % injection 10 mL  10 mL Intravenous PRN Lloyd Huger, MD   10 mL at 04/05/18 0800    There are no Patient Instructions on file for this visit. No follow-ups on file.   Kris Hartmann, NP

## 2020-11-26 ENCOUNTER — Telehealth: Payer: Self-pay

## 2020-11-26 ENCOUNTER — Other Ambulatory Visit: Payer: Self-pay | Admitting: Internal Medicine

## 2020-11-26 DIAGNOSIS — S63104D Unspecified dislocation of right thumb, subsequent encounter: Secondary | ICD-10-CM | POA: Diagnosis not present

## 2020-11-26 DIAGNOSIS — F4321 Adjustment disorder with depressed mood: Secondary | ICD-10-CM

## 2020-11-26 DIAGNOSIS — I2510 Atherosclerotic heart disease of native coronary artery without angina pectoris: Secondary | ICD-10-CM

## 2020-11-26 DIAGNOSIS — I429 Cardiomyopathy, unspecified: Secondary | ICD-10-CM

## 2020-11-26 DIAGNOSIS — I428 Other cardiomyopathies: Secondary | ICD-10-CM

## 2020-11-26 DIAGNOSIS — E1151 Type 2 diabetes mellitus with diabetic peripheral angiopathy without gangrene: Secondary | ICD-10-CM

## 2020-11-26 NOTE — Chronic Care Management (AMB) (Signed)
  Chronic Care Management   Note  11/26/2020 Name: Gary [name] MRN: [id number] DOB: 31-Oct-1945  Gary [name] is a 75 y.o. year old male who is a primary care patient of [name], MD. I reached out to Gary [name] by phone today in response to a referral sent by [name] PCP, Dr.Scott     [name] was given information about Chronic Care Management services today including:  1. CCM service includes personalized support from designated clinical staff supervised by his physician, including individualized plan of care and coordination with other care providers 2. 24/7 contact phone numbers for assistance for urgent and routine care needs. 3. Service will only be billed when office clinical staff spend 20 minutes or more in a month to coordinate care. 4. Only one practitioner may furnish and bill the service in a calendar month. 5. The patient may stop CCM services at any time (effective at the end of the month) by phone call to the office staff. 6. The patient will be responsible for cost sharing (co-pay) of up to 20% of the service fee (after annual deductible is met).  Patient agreed to services and verbal consent obtained.   Follow up plan: Telephone appointment with care management team member scheduled for:  LCSW 11/27/2020 RNCM 11/29/2020 Pharm D 12/13/2020  Noreene Larsson, [address], [place], [place] [address] Direct Dial: [phone] [email or web address]

## 2020-11-26 NOTE — Progress Notes (Signed)
Order placed for CCM referral.

## 2020-11-27 ENCOUNTER — Other Ambulatory Visit: Payer: Self-pay

## 2020-11-27 ENCOUNTER — Ambulatory Visit (INDEPENDENT_AMBULATORY_CARE_PROVIDER_SITE_OTHER): Payer: Medicare Other | Admitting: Nurse Practitioner

## 2020-11-27 ENCOUNTER — Ambulatory Visit (INDEPENDENT_AMBULATORY_CARE_PROVIDER_SITE_OTHER): Payer: Medicare Other | Admitting: *Deleted

## 2020-11-27 ENCOUNTER — Encounter (INDEPENDENT_AMBULATORY_CARE_PROVIDER_SITE_OTHER): Payer: Self-pay

## 2020-11-27 VITALS — BP 126/71 | HR 86 | Resp 16 | Wt 243.6 lb

## 2020-11-27 DIAGNOSIS — L97201 Non-pressure chronic ulcer of unspecified calf limited to breakdown of skin: Secondary | ICD-10-CM | POA: Diagnosis not present

## 2020-11-27 NOTE — Chronic Care Management (AMB) (Signed)
Chronic Care Management    Clinical Social Work Note  11/27/2020 Name: Gary [name] MRN: [id number] DOB: [birth year]  Gary [name] is a 75 y.o. year old male who is a primary care patient of [name], MD. The CCM team was consulted to assist the patient with chronic disease management and/or care coordination needs related to: Transportation Needs , [employer] , Level of Care Concerns, Mental Health Counseling and Resources, Grief Counseling and Financial Difficulties related to inability to purchase a new vehicle..   Engaged with patient by telephone for initial visit in response to provider referral for social work chronic care management and care coordination services.   Consent to Services:    Patient agreed to services and consent obtained.   Assessment: Review of patient past medical history, allergies, medications, and health status, including review of relevant consultants reports was performed today as part of a comprehensive evaluation and provision of chronic care management and care coordination services.     SDOH (Social Determinants of Health) assessments and interventions performed:  SDOH Interventions   Flowsheet Row Most Recent Value  SDOH Interventions   Food Insecurity Interventions Intervention Not Indicated  Financial Strain Interventions Other (Comment)  [Referral to Care Guide to provide transportation resources.]  Housing Interventions Intervention Not Indicated  Intimate Partner Violence Interventions Intervention Not Indicated  Physical Activity Interventions Patient Refused  Stress Interventions Fletcher, Provide Counseling, Patient Refused  Social Connections Interventions Patient Refused  Transportation Interventions [facility], Other (Comment)  [Referral to Care Guide for transportation resources.]  Alcohol Brief Interventions/Follow-up AUDIT Score <7 follow-up not indicated       Advanced  Directives Status: Not ready or willing to discuss.  CCM Care Plan  Allergies  Allergen Reactions  . Rofecoxib Nausea Only    Outpatient Encounter Medications as of 11/27/2020  Medication Sig  . ACCU-CHEK AVIVA PLUS test strip USE AS INSTRUCTED. USE 1 STRIP EVERY 3 HOURS . ACCU-CHECK AVIVA PLUS TEST STRIP. DX Z76.734  . acetaminophen (TYLENOL) 500 MG tablet Take 500 mg by mouth every 6 (six) hours as needed.   . carvedilol (COREG) 3.125 MG tablet Take 1 tablet by mouth 2 (two) times daily with a meal.  . cefdinir (OMNICEF) 300 MG capsule TAKE 1 CAPSULE BY MOUTH 2 TIMES DAILY FOR 7 DAYS.  . furosemide (LASIX) 20 MG tablet Take 40 mg by mouth 2 (two) times daily. 2 tablets in the morning 1 tablet in the afternoon as needed  . gabapentin (NEURONTIN) 100 MG capsule Take 1 capsule (100 mg total) by mouth at bedtime.  Marland Kitchen GLUCAGON EMERGENCY 1 MG injection   . glucagon, human recombinant, (GLUCAGEN) 1 MG injection Use as directed  . glucose 4 GM chewable tablet Chew 1 tablet by mouth once as needed for low blood sugar.  Marland Kitchen glucose blood test strip Use 8 (eight) times daily as directed. BAYER CONTOUR NEXT TEST STRIPS E10.649  . insulin aspart (NOVOLOG) 100 UNIT/ML injection Basal rate 12am 0.8 units per hour, 9am 0.9 units per hour. (total basal insulin 20.7 units). Carbohydrate ratio 1 units for every 8gm of carbohydrate. Correction factor 1 units for every 50mg /dl over target cbg. Target CBG 80-120  . lisinopril (ZESTRIL) 20 MG tablet Take by mouth.  . nystatin cream (MYCOSTATIN) Apply 1 application topically 2 (two) times daily.  . potassium chloride (KLOR-CON) 10 MEQ tablet Take 1 tablet (10 mEq total) by mouth daily.  . pravastatin (PRAVACHOL) 40 MG tablet Take 40  mg by mouth daily.   . prochlorperazine (COMPAZINE) 10 MG tablet TAKE 1 TABLET (10 MG TOTAL) BY MOUTH EVERY 6 (SIX) HOURS AS NEEDED (NAUSEA OR VOMITING).  . vitamin B-12 (CYANOCOBALAMIN) 250 MCG tablet Take 250 mcg by mouth daily.  .  Vitamin D, Cholecalciferol, 10 MCG (400 UNIT) TABS Take by mouth.   Facility-Administered Encounter Medications as of 11/27/2020  Medication  . heparin lock flush 100 unit/mL  . sodium chloride flush (NS) 0.9 % injection 10 mL  . sodium chloride flush (NS) 0.9 % injection 10 mL    Patient Active Problem List   Diagnosis Date Noted  . Hypokalemia 10/24/2020  . Injury by nail 08/15/2020  . Shingles 07/24/2020  . Rash 07/02/2020  . Colon cancer screening 07/02/2020  . Sick sinus syndrome ([place]) 12/28/2019  . Acquired absence of kidney 09/13/2019  . Edema of lower extremity 09/13/2019  . 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 Proteinuria 09/13/2019  . Lower extremity pain, bilateral   . Diabetic foot ulcer associated with type 1 diabetes mellitus ([place])   . Acute on chronic systolic CHF (congestive heart failure) ([place])   . Cellulitis 08/18/2019  . Lower limb ulcer, calf ([place]) 08/01/2019  . Left hip pain 07/16/2019  . Pain due to onychomycosis of toenails of both feet 04/03/2019  . Swelling of limb 02/07/2019  . Pancytopenia ([place]) 12/03/2018  . CKD (chronic kidney disease) stage 3, GFR 30-59 ml/min (HCC) 09/16/2018  . Left shoulder pain 09/16/2018  . Chronic cholecystitis 12/29/2017  . Graves disease 12/29/2017  . Peripheral neuropathy 12/29/2017  . S/p nephrectomy 12/29/2017  . 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 Symptomatic bradycardia 12/02/2017  . AKI (acute kidney injury) ([place]) 12/02/2017  . Chronic systolic CHF (congestive heart failure) ([place]) 12/02/2017  . Saturday night paralysis 11/12/2017  . Hypoglycemia 10/28/2017  . Goals of care, counseling/discussion 10/24/2017  . Lymphedema 10/20/2017  . Iron deficiency anemia 09/11/2017  . Fall 08/13/2017  . Humerus fracture 08/13/2017  . Anemia 01/12/2017  . Bilateral carotid artery stenosis 06/29/2016  . Hand laceration 12/16/2015  .  Adjustment disorder with depressed mood 08/11/2015  . Cardiomyopathy, idiopathic ([place]) 04/22/2015  . CAD in native artery 11/02/2014  . Mantle cell lymphoma ([place]) 08/03/2014  . History of colonic polyps 04/29/2014  . Irregular heart beat 04/22/2014  . SOB (shortness of breath) 04/22/2014  . Stress 03/21/2014  . PVC (premature ventricular contraction) 02/21/2014  . GERD (gastroesophageal reflux disease) 10/23/2013  . Gastroparesis 06/25/2013  . Chest pain 04/13/2013  . Thyroid disease 04/13/2013  . Diabetes ([place]) 04/13/2013  . Essential hypertension, benign 04/13/2013  . Hypercholesterolemia 04/13/2013    Conditions to be addressed/monitored: Transportation, Housing, Grief and Loss Counseling, Long-Term Care Placement, In-Home Care Services, Completion of Advanced Directives, Applying for [employer], [place].; Financial constraints related to purchasing a new vehicle, Limited social support, Transportation, Housing barriers, Level of care concerns, ADL IADL limitations, Mental Health Concerns  and Family and relationship dysfunction.  Patient is ONLY agreeable to receiving EMMI educational material for his independent review, and working with an [place] to obtain transportation and housing resources.  Care Plan : LCSW Plan of Care  Updates made by Francis Gaines, LCSW since 11/27/2020 12:00 AM    Problem: Patient Requires Assistance Navigating Available Resources for Transportation and Housing.   Priority: High  Note:   Current barriers:   . Patient in  need of assistance with connecting to community resources for liable transportation services and affordable, handicapped accessible housing. . Acknowledges deficits with regards to available community agencies and resources. . Patient is unable to independently navigate community resource options without care coordination support through [place].  Clinical Goals: Over the next 30  days, patient will work with CSW and [place] to address needs related to transportation, housing, and counseling for grief and loss.  Clinical Interventions:  . Collaboration with [name], MD regarding development and update of comprehensive plan of care as evidenced by provider attestation and co-signature. Bertram Savin care team collaboration (see longitudinal plan of care). . Assessment of needs and barriers that may be impacting patient's health and mental health. . Review various resources, discussed options and provided patient information about: 1. Grief and loss counseling and support groups, 2. Ongoing individual counseling with CSW, weekly or bi-weekly, 3. Referral for counseling within the community to a reputable provider, 4. Long-term care placement into an independent living facility, assisted living facility, group home, or family care home, 5. Affordable housing options in [place], 6. Applying for Aid and Attendance Benefits through [place] to receive in-home care services, 7. Applying for Adult Medicaid and/or SNAP (Supplemental Nutritional Assistance Program) through the [place], 8. Review of available transportation options for medical appointments. Nash Dimmer with appropriate clinical care team members regarding patient needs:  Embedded Care Guide. . Other interventions provided:Solution-Focused Strategies, Grief Counseling, Emotional/Supportive Counseling, and Problem Solving.  Patient Goals/Self-Care Activities:  . Reconsider allowing CSW to provide counseling and supportive services, weekly or bi-weekly, to discuss feelings of grief and loss over the recent death of your wife. . Work with Care Guide to obtain resources/referral for reliable transportation to and from all of your physician appointments. . Work with [place] to obtain resources/referral for affordable, handicapped  accessible housing, in [place], [place]. .       (Care Guide will contact you by phone within the next 10 business days). . Review EMMI educational material, pertaining specifically to "Dealing with Death", "Helping You Cope" and "Tips for How to Help Your Mood", mailed to your home by CSW today. . Request that your sister get duplicate keys made of your home to hide by the front door, to help relieve your anxiety about 911 not being able to enter your home ,in the event of an emergency.     . Why is this important?    Knowing how and where to find help for yourself or family in your neighborhood and community is an important skill.   You will want to take some steps to learn how.   Follow Up Plan: Patient agreeable to having CSW follow-up with him again on Thursday, December 05, 2020, at 11:00AM.          Follow Up Plan: CSW will follow up with patient by phone on Thursday, December 05, 2020, at [facility] Licensed Clinical Social Worker [place]  [phone]

## 2020-11-27 NOTE — Progress Notes (Signed)
History of Present Illness  There is no documented history at this time  Assessments & Plan   There are no diagnoses linked to this encounter.    Additional instructions  Subjective:  Patient presents with venous ulcer of the Bilateral lower extremity.    Procedure:  3 layer unna wrap was placed Bilateral lower extremity.   Plan:   Follow up in one week.  

## 2020-11-27 NOTE — Patient Instructions (Addendum)
Visit Information  PATIENT GOALS:  Goals Addressed            This Visit's Progress   . 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   On track    Timeframe:  Short-Term Goal Priority:  High Start Date:  11/27/2020                        Expected End Date:  12/25/2020                 As we discussed: . Reconsider allowing CSW to provide counseling and supportive services, weekly or bi-weekly, to discuss feelings of grief and loss over the recent death of your wife. . Work with Care Guide to obtain resources/referral for reliable transportation to and from all of your physician appointments. . Work with [place] to obtain resources/referral for affordable, handicapped accessible housing, in [place], [place].       (Care Guide will contact you by phone within the next 10 business days). . Review EMMI educational material, pertaining specifically to "Dealing with Death", "Helping You Cope" and "Tips for How to Help Your Mood", mailed to your home by CSW today. . Request that your sister get duplicate keys made of your home to hide by the front door, to help relieve your anxiety about 911 not being able to enter your home ,in the event of an emergency.     Why is this important?    Knowing how and where to find help for yourself or family in your neighborhood and community is an important skill.   You will want to take some steps to learn how.          Consent to CCM Services: Gary [name] was given information about Chronic Care Management services today including:  1. CCM service includes personalized support from designated clinical staff supervised by his physician, including individualized plan of care and coordination with other care providers 2. 24/7 contact phone numbers for assistance for urgent and routine care needs. 3. Service will only be billed when office clinical staff spend 20 minutes or more in a month to coordinate care. 4. Only one  practitioner may furnish and bill the service in a calendar month. 5. The patient may stop CCM services at any time (effective at the end of the month) by phone call to the office staff. 6. The patient will be responsible for cost sharing (co-pay) of up to 20% of the service fee (after annual deductible is met).  Patient agreed to services and verbal consent obtained.   The patient verbalized understanding of instructions, educational materials, and care plan provided today and declined offer to receive copy of patient instructions, educational materials, and care plan.    CLINICAL CARE PLAN: Patient Care Plan: LCSW Plan of Care    Problem Identified: Patient Requires Assistance Navigating Available Resources for Transportation and Housing.   Priority: High  Note:   Current barriers:   . Patient in need of assistance with connecting to community resources for liable transportation services and affordable, handicapped accessible housing. . Acknowledges deficits with regards to available community agencies and resources. . Patient is unable to independently navigate community resource options without care coordination support through [place].  Clinical Goals: Over the next 30 days, patient will work with CSW and [place] to address needs related to transportation, housing, and counseling for grief and loss.  Clinical Interventions:  . Collaboration with  [name], MD regarding development and update of comprehensive plan of care as evidenced by provider attestation and co-signature. Gary [name] care team collaboration (see longitudinal plan of care). . Assessment of needs and barriers that may be impacting patient's health and mental health. . Review various resources, discussed options and provided patient information about: 1. Grief and loss counseling and support groups, 2. Ongoing individual counseling with CSW, weekly or bi-weekly, 3. Referral for  counseling within the community to a reputable provider, 4. Long-term care placement into an independent living facility, assisted living facility, group home, or family care home, 5. Affordable housing options in [place], 6. Applying for Aid and Attendance Benefits through [place] to receive in-home care services, 7. Applying for Adult Medicaid and/or SNAP (Supplemental Nutritional Assistance Program) through the [place], 8. Review of available transportation options for medical appointments. Gary [name] with appropriate clinical care team members regarding patient needs:  Embedded Care Guide. . Other interventions provided:Solution-Focused Strategies, Grief Counseling, Emotional/Supportive Counseling, and Problem Solving.  Patient Goals/Self-Care Activities:  . Reconsider allowing CSW to provide counseling and supportive services, weekly or bi-weekly, to discuss feelings of grief and loss over the recent death of your wife. . Work with Care Guide to obtain resources/referral for reliable transportation to and from all of your physician appointments. . Work with [place] to obtain resources/referral for affordable, handicapped accessible housing, in [place], [place]. .       (Care Guide will contact you by phone within the next 10 business days). . Review EMMI educational material, pertaining specifically to "Dealing with Death", "Helping You Cope" and "Tips for How to Help Your Mood", mailed to your home by CSW today. . Request that your sister get duplicate keys made of your home to hide by the front door, to help relieve your anxiety about 911 not being able to enter your home ,in the event of an emergency.     . Why is this important?    Knowing how and where to find help for yourself or family in your neighborhood and community is an important skill.   You will want to take some steps to learn how.   Follow Up Plan: Patient  agreeable to having CSW follow-up with him again on Thursday, December 05, 2020, at 11:00AM.

## 2020-11-28 ENCOUNTER — Telehealth: Payer: Self-pay

## 2020-11-28 DIAGNOSIS — E10649 Type 1 diabetes mellitus with hypoglycemia without coma: Secondary | ICD-10-CM | POA: Diagnosis not present

## 2020-11-28 NOTE — Telephone Encounter (Signed)
   Telephone encounter was:  Successful.  11/28/2020 Name: Gary [name] MRN: [id number] DOB: [birth year]  Gary [name] is a 75 y.o. year old male who is a primary care patient of [name], MD . The community resource team was consulted for assistance with Transportation Needs  and housing  Care guide performed the following interventions: Patient provided with information about care guide support team and interviewed to confirm resource needs Obtained verbal consent to place patient referral to [facility] via phone Placed referral to [employer] via email Spoke with [employer] at [employer] transportation is set for 12/11/20 appt. patient needs to be ready by 9:45am pick up will be between 10-10:15 am. Patient will need to call [employer]/[phone] when appt is over for ride home                                           Emailed request to [facility] transportation for  12/04/2020 appointment.  Follow Up Plan:  Care guide will follow up with patient by phone over the next 4 days and I will follow-up with [facility] to confirm request for 2/23 has been received. [employer] will contact patient the day before appointment.  Dontrell Stuck, AAS Paralegal, [place] . Embedded Care Coordination [facility]  Care Management  [address], [place] [address] ??[email or web address]  ?? [phone]   [email or web address]

## 2020-11-28 NOTE — Telephone Encounter (Signed)
   Telephone encounter was:  Successful.  11/28/2020 Name: Gary [name] MRN: [id number] DOB: [birth year]  [name] is a 75 y.o. year old male who is a primary care patient of [name], MD . The community resource team was consulted for assistance with Transportation Needs  and housing  Care guide performed the following interventions: Follow up call placed to community resources to determine status of patients referral Follow up call placed to the patient to discuss status of referral Spoke with Mo at [facility] to confirm transportation for 2/23 appt they will call patient to enroll and send me an email cofirmation.  .  Follow Up Plan:  Care guide will outreach resources to assist patient with transportation and will research housing resources and email to patient per request.  Manilla Strieter Andree Elk, AAS Paralegal, [place] . Embedded Care Coordination [facility]  Care Management  [address], [place] [address] ??[email or web address]  ?? [phone]   [email or web address]

## 2020-11-29 ENCOUNTER — Telehealth: Payer: 59

## 2020-11-29 ENCOUNTER — Telehealth: Payer: Self-pay

## 2020-11-29 ENCOUNTER — Telehealth: Payer: Self-pay | Admitting: *Deleted

## 2020-11-29 ENCOUNTER — Encounter: Payer: Self-pay | Admitting: *Deleted

## 2020-11-29 ENCOUNTER — Telehealth: Payer: Self-pay | Admitting: Internal Medicine

## 2020-11-29 NOTE — Telephone Encounter (Signed)
Entered in error please disregard. Gary [name]

## 2020-11-29 NOTE — Telephone Encounter (Signed)
   Telephone encounter was:  Successful.  11/29/2020 Name: Gary [name] MRN: [id number] DOB: [birth year]  [name] is a 75 y.o. year old male who is a primary care patient of [name], MD . The community resource team was consulted for assistance with Transportation Needs   Care guide performed the following interventions: Follow up call placed to community resources to determine status of patients referral Follow up call placed to the patient to discuss status of referral Spoke with [name] at [place] patient has been enrolled and transportation is scheduled for 2/23 appointment, request is being sent to [facility] dispatch team. They have called the patient and he knows he will receive a call a day prior.  [employer] will call the patient the day prior. Patient received emailed details about transportation .Marland Kitchen  Follow Up Plan:  No further follow up planned at this time. The patient has been provided with needed resources.  Nafis Farnan, AAS Paralegal, [place] . Embedded Care Coordination [facility]  Care Management  [address] [place],  [address] ??[email or web address]  ?? [phone]   [email or web address]

## 2020-11-29 NOTE — Telephone Encounter (Signed)
  Chronic Care Management   Outreach Note  11/29/2020 Name: Gary [name] MRN: [id number] DOB: 1945-10-25  Referred by: [name], MD Reason for referral : Chronic Care Management (DM, HF, HTN)   An unsuccessful telephone outreach was attempted today. The patient was referred to the case management team for assistance with care management and care coordination.     Follow Up Plan: RNCM will request assistance from Care Guide to reschedule initial telephone assessment with nursing within the next 10 business days.  [name] RN, MSN RN Care Management Coordinator [place] [phone] [email or web address]

## 2020-11-29 NOTE — Telephone Encounter (Signed)
   Gary [name] DOB: 06-14-1946 MRN: [id number]   RIDER WAIVER AND RELEASE OF LIABILITY  For purposes of improving physical access to our facilities, [facility] is pleased to partner with third parties to provide [facility] patients or other authorized individuals the option of convenient, on-demand ground transportation services (the Technical brewer") through use of the technology service that enables users to request on-demand ground transportation from independent third-party providers.  By opting to use and accept these [employer], I, the undersigned, hereby agree on behalf of myself, and on behalf of any minor child using the [employer] for whom I am the parent or legal guardian, as follows:  1. Government social research officer provided to me are provided by independent third-party transportation providers who are not [employer] or employees and who are unaffiliated with [employer]. 2. [facility] is neither a transportation carrier nor a common or public carrier. 3. [facility] has no control over the quality or safety of the transportation that occurs as a result of the [employer]. 4. [facility] cannot guarantee that any third-party transportation provider will complete any arranged transportation service. 5. [facility] makes no representation, warranty, or guarantee regarding the reliability, timeliness, quality, safety, suitability, or availability of any of the Transport Services or that they will be error free. 6. I fully understand that traveling by vehicle involves risks and dangers of serious bodily injury, including permanent disability, paralysis, and death. I agree, on behalf of myself and on behalf of any minor child using the Transport Services for whom I am the parent or legal guardian, that the entire risk arising out of my use of the [employer] remains solely with me, to the maximum extent permitted under applicable law. 7. The [employer]  are provided "as is" and "as available." [facility] disclaims all representations and warranties, express, implied or statutory, not expressly set out in these terms, including the implied warranties of merchantability and fitness for a particular purpose. 8. I hereby waive and release [place], its agents, employees, officers, directors, representatives, insurers, attorneys, assigns, successors, subsidiaries, and affiliates from any and all past, present, or future claims, demands, liabilities, actions, causes of action, or suits of any kind directly or indirectly arising from acceptance and use of the [employer]. 9. I further waive and release [facility] and its affiliates from all present and future liability and responsibility for any injury or death to persons or damages to property caused by or related to the use of the [employer]. 10. I have read this Waiver and Release of Liability, and I understand the terms used in it and their legal significance. This Waiver is freely and voluntarily given with the understanding that my right (as well as the right of any minor child for whom I am the parent or legal guardian using the [employer]) to legal recourse against [place] in connection with the [employer] is knowingly surrendered in return for use of these services.   I attest that I read the consent document to Gary [name], gave [name] the opportunity to ask questions and answered the questions asked (if any). I affirm that Gary [name] then provided consent for he's participation in this program.     Gary [name]

## 2020-11-30 ENCOUNTER — Other Ambulatory Visit: Payer: Self-pay | Admitting: Internal Medicine

## 2020-12-03 ENCOUNTER — Encounter: Payer: Self-pay | Admitting: Internal Medicine

## 2020-12-03 NOTE — Telephone Encounter (Signed)
Patient has been rescheduled for 12/06/2020

## 2020-12-04 ENCOUNTER — Encounter (INDEPENDENT_AMBULATORY_CARE_PROVIDER_SITE_OTHER): Payer: Medicare Other

## 2020-12-04 NOTE — Telephone Encounter (Signed)
Called and spoke to Mr Hrdlicka.  He ate lunch around 11:30 -12:00 yesterday.  Sat down to read Emails.  Was going to get a candy bar and does not remember anything until he woke up on the floor.  Elbow was bleeding.  No other cuts.  No headache.  States his sugar before the episode was 130. He thinks the episode occurred around 3:00.  Was on the floor for a few hours.  Blood sugar when he came to and sat up - 65.  Has Dexcom sensor.  Denied any chest pain or change in breathing when the episode occurred.   He is feeling better now.  No headache or dizziness.  Breathing stable.  No chest pain.  Has a meal to eat - delivered to him.  (now getting meals delivered).  States his blood sugar now is 147.  Planning to eat when we get off the phone.  Discussed the need for evaluation.  He declines - stating he is feeling better.  Discussed sending someone out to transport him to be evaluated.  He states he would decline to go - he does not feel he needs to be evaluated at this time since feeling better.  Increased stress with above. Discussed with him.  He denies any suicidal ideations.  Is in the process of speaking with someone to see what housing options are available. Is not driving now.  Transportation has been arranged for his appts.  He continues to decline further evaluation.  CCM referral previously placed.  They have arranged meals and helping with transportation.  D/w them regarding continued help with housing.  I am going to try to message [name] to see if anything more we can do.

## 2020-12-04 NOTE — Telephone Encounter (Signed)
Patient called in before reviewing this message. He was calling to ask for help to cancel the transportation services to his appts because he is sore and did not want to be "too much trouble." Was crying while on the phone. Patient stated that yesterday around 3:00 he sat down at his kitchen table to eat a milky way and then next thing he remembers is waking up in the floor around 6:15pm with no recollection of how he got there. He woke up with his phone beside him and was not able to get himself up. His sugars were in the high 60s. His sister, brother in law and neighbor came and helped him get up and get cleaned up. I strongly advised that he should go to urgent care or ED to be evaluated since he did black out for 3 hours and was unsure what happened. Patient declined and says he is fine. He is just sore. His sugar was 252 while on the phone. Patient continued to keep crying and apologizing for being such a burden and stating that he did not know who else to call. Patient did not seem like himself on the phone. Called patient back to let him know I was going to send EMS to come and check him out to ensure he did not need to be transported. Patient adamantly refused and stated he did not want them to come. Did not need them to come. Pt told me not to call them. Advised I would send over to [name].

## 2020-12-05 ENCOUNTER — Ambulatory Visit: Payer: Medicare Other | Admitting: *Deleted

## 2020-12-05 DIAGNOSIS — I10 Essential (primary) hypertension: Secondary | ICD-10-CM

## 2020-12-05 DIAGNOSIS — F439 Reaction to severe stress, unspecified: Secondary | ICD-10-CM

## 2020-12-05 DIAGNOSIS — E1151 Type 2 diabetes mellitus with diabetic peripheral angiopathy without gangrene: Secondary | ICD-10-CM

## 2020-12-05 DIAGNOSIS — F4321 Adjustment disorder with depressed mood: Secondary | ICD-10-CM

## 2020-12-05 NOTE — Patient Instructions (Signed)
Visit Information  PATIENT GOALS: Goals Addressed            This Visit's Progress   . 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       Timeframe:  Short-Term Goal Priority:  High Start Date:  11/27/2020                        Expected End Date:  12/25/2020                 As we discussed: . Reconsider allowing CSW to provide counseling and supportive services, weekly or bi-weekly, to discuss feelings of grief and loss over the recent death of your wife. . Continue to utilize [facility] for all physician appointments and schedule transport for upcoming appointments.   . Continue to work with Care Guide to obtain resources/referral for affordable, handicapped accessible housing, in [place], [place].  Continue to review EMMI educational material, pertaining specifically to "Dealing           with Death", "Helping You Cope" and "Tips for How to Help Your Mood".    Reconsider allowing CSW to assist you with completion of Aid and Attendance         Benefits application through [name].    Why is this important?    Knowing how and where to find help for yourself or family in your neighborhood and community is an important skill.   You will want to take some steps to learn how.          Patient verbalizes understanding of instructions provided today and agrees to view in [place].   Telephone follow up appointment with care management team member scheduled for:  12/13/2020 at 10:00am.  Nat Christen LCSW Licensed Clinical Social Worker [place]  [phone]

## 2020-12-05 NOTE — Chronic Care Management (AMB) (Signed)
Chronic Care Management    Clinical Social Work Note  12/05/2020 Name: Gary [name] MRN: [id number] DOB: [birth year]  Gary [name] is a 75 y.o. year old male who is a primary care patient of [name], MD. The CCM team was consulted to assist the patient with chronic disease management and/or care coordination needs related to: Transportation Needs , [employer] , Level of Care Concerns, Mental Health Counseling and Resources and Grief Counseling.   Engaged with patient by telephone for follow up visit in response to provider referral for social work chronic care management and care coordination services.   Consent to Services:   Patient agreed to services and consent obtained.   Assessment: Review of patient past medical history, allergies, medications, and health status, including review of relevant consultants reports was performed today as part of a comprehensive evaluation and provision of chronic care management and care coordination services.     SDOH (Social Determinants of Health) assessments and interventions performed:    Advanced Directives Status: Not addressed in this encounter.  CCM Care Plan  Allergies  Allergen Reactions  . Rofecoxib Nausea Only    Outpatient Encounter Medications as of 12/05/2020  Medication Sig  . gabapentin (NEURONTIN) 100 MG capsule Take 1 capsule (100 mg total) by mouth at bedtime.  Marland Kitchen ACCU-CHEK AVIVA PLUS test strip USE AS INSTRUCTED. USE 1 STRIP EVERY 3 HOURS . ACCU-CHECK AVIVA PLUS TEST STRIP. DX Q46.962  . acetaminophen (TYLENOL) 500 MG tablet Take 500 mg by mouth every 6 (six) hours as needed.   . carvedilol (COREG) 3.125 MG tablet Take 1 tablet by mouth 2 (two) times daily with a meal.  . cefdinir (OMNICEF) 300 MG capsule TAKE 1 CAPSULE BY MOUTH 2 TIMES DAILY FOR 7 DAYS.  . furosemide (LASIX) 20 MG tablet Take 40 mg by mouth 2 (two) times daily. 2 tablets in the morning 1 tablet in the afternoon as needed  . GLUCAGON EMERGENCY  1 MG injection   . glucagon, human recombinant, (GLUCAGEN) 1 MG injection Use as directed  . glucose 4 GM chewable tablet Chew 1 tablet by mouth once as needed for low blood sugar.  Marland Kitchen glucose blood test strip Use 8 (eight) times daily as directed. BAYER CONTOUR NEXT TEST STRIPS E10.649  . insulin aspart (NOVOLOG) 100 UNIT/ML injection Basal rate 12am 0.8 units per hour, 9am 0.9 units per hour. (total basal insulin 20.7 units). Carbohydrate ratio 1 units for every 8gm of carbohydrate. Correction factor 1 units for every 50mg /dl over target cbg. Target CBG 80-120  . lisinopril (ZESTRIL) 20 MG tablet Take by mouth.  . nystatin cream (MYCOSTATIN) Apply 1 application topically 2 (two) times daily.  . potassium chloride (KLOR-CON) 10 MEQ tablet TAKE 1 TABLET BY MOUTH TWICE DAILY FOR 3 DAYS THEN CONTINUE ONCE DAILY  . pravastatin (PRAVACHOL) 40 MG tablet Take 40 mg by mouth daily.   . prochlorperazine (COMPAZINE) 10 MG tablet TAKE 1 TABLET (10 MG TOTAL) BY MOUTH EVERY 6 (SIX) HOURS AS NEEDED (NAUSEA OR VOMITING).  . vitamin B-12 (CYANOCOBALAMIN) 250 MCG tablet Take 250 mcg by mouth daily.  . Vitamin D, Cholecalciferol, 10 MCG (400 UNIT) TABS Take by mouth.   Facility-Administered Encounter Medications as of 12/05/2020  Medication  . heparin lock flush 100 unit/mL  . sodium chloride flush (NS) 0.9 % injection 10 mL  . sodium chloride flush (NS) 0.9 % injection 10 mL    Patient Active Problem List   Diagnosis Date Noted  .  Hypokalemia 10/24/2020  . Injury by nail 08/15/2020  . Shingles 07/24/2020  . Rash 07/02/2020  . Colon cancer screening 07/02/2020  . Sick sinus syndrome ([place]) 12/28/2019  . Acquired absence of kidney 09/13/2019  . Edema of lower extremity 09/13/2019  . 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 Proteinuria 09/13/2019  . Lower extremity pain, bilateral   . Diabetic foot ulcer associated with type 1  diabetes mellitus ([place])   . Acute on chronic systolic CHF (congestive heart failure) ([place])   . Cellulitis 08/18/2019  . Lower limb ulcer, calf ([place]) 08/01/2019  . Left hip pain 07/16/2019  . Pain due to onychomycosis of toenails of both feet 04/03/2019  . Swelling of limb 02/07/2019  . Pancytopenia ([place]) 12/03/2018  . CKD (chronic kidney disease) stage 3, GFR 30-59 ml/min (HCC) 09/16/2018  . Left shoulder pain 09/16/2018  . Chronic cholecystitis 12/29/2017  . Graves disease 12/29/2017  . Peripheral neuropathy 12/29/2017  . S/p nephrectomy 12/29/2017  . 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 Symptomatic bradycardia 12/02/2017  . AKI (acute kidney injury) ([place]) 12/02/2017  . Chronic systolic CHF (congestive heart failure) ([place]) 12/02/2017  . Saturday night paralysis 11/12/2017  . Hypoglycemia 10/28/2017  . Goals of care, counseling/discussion 10/24/2017  . Lymphedema 10/20/2017  . Iron deficiency anemia 09/11/2017  . Fall 08/13/2017  . Humerus fracture 08/13/2017  . Anemia 01/12/2017  . Bilateral carotid artery stenosis 06/29/2016  . Hand laceration 12/16/2015  . Adjustment disorder with depressed mood 08/11/2015  . Cardiomyopathy, idiopathic ([place]) 04/22/2015  . CAD in native artery 11/02/2014  . Mantle cell lymphoma ([place]) 08/03/2014  . History of colonic polyps 04/29/2014  . Irregular heart beat 04/22/2014  . SOB (shortness of breath) 04/22/2014  . Stress 03/21/2014  . PVC (premature ventricular contraction) 02/21/2014  . GERD (gastroesophageal reflux disease) 10/23/2013  . Gastroparesis 06/25/2013  . Chest pain 04/13/2013  . Thyroid disease 04/13/2013  . Diabetes ([place]) 04/13/2013  . Essential hypertension, benign 04/13/2013  . Hypercholesterolemia 04/13/2013    Conditions to be addressed/monitored: HTN, DMII and Adjustment Disorder w/ Depressed Mood, Stress.; Limited social support, Transportation, Level of care concerns, Mental Health Concerns  and Social  Isolation.  Care Plan : LCSW Plan of Care  Updates made by Francis Gaines, LCSW since 12/05/2020 12:00 AM    Problem: Patient Requires Assistance Navigating Available Resources for Transportation and Housing.   Priority: High  Note:   Current barriers:   . Patient in need of assistance with connecting to community resources for liable transportation services and affordable, handicapped accessible housing. . Acknowledges deficits with regards to available community agencies and resources. . Patient is unable to independently navigate community resource options without care coordination support through [facility].  Clinical Goals: Over the next 30 days, patient will work with CSW and [place] to address needs related to transportation, housing, and counseling for grief and loss.  Clinical Interventions:  . Collaboration with [name], MD regarding development and update of comprehensive plan of care as evidenced by provider attestation and co-signature. Bertram Savin care team collaboration (see longitudinal plan of care).  Assessment of needs and barriers that may be impacting patient's health and mental health. Nash Dimmer with appropriate clinical care team members regarding patient needs:  [place], Embedded RN [place], [name] and Pharmacist, Catie Salmon. . Other interventions provided: Solution-Focused Strategies, Grief Counseling, Emotional/Supportive Counseling, Psychotropic Medication  Adherence Assessment, Sleep Hygiene, Problem Solving, and Consulted with MD regarding Level of Care Concerns, Referral to Adult Protective Services (APS Worker - [name] with [facility]), and Consideration of Referral to [name] for Aid and [employer].  Patient Goals/Self-Care Activities:  . Reconsider allowing CSW to provide counseling and supportive services, weekly or  bi-weekly, to discuss feelings of grief and loss over the recent death of your wife. . Continue to utilize [facility] for all physician appointments and schedule transport for upcoming appointments.   . Continue to work with Care Guide to obtain resources/referral for affordable, handicapped accessible housing, in [place], [place].  Continue to review EMMI educational material, pertaining specifically to "Dealing with Death", "Helping You Cope" and "Tips for How to Help Your Mood".    Reconsider allowing CSW to assist you with completion of Aid and Attendance Benefits application through [name].    Why is this important?    Knowing how and where to find help for yourself or family in your neighborhood and community is an important skill.   You will want to take some steps to learn how.   Follow Up Plan: Patient agreeable to having CSW follow-up with him again next week, on 12/13/2020 at 10:00am.          Follow Up Plan: CSW will follow up with patient by phone again next week, on 12/13/2020 at 10:00am.    Nat Christen LCSW Licensed Clinical Social Worker [place]  [phone]

## 2020-12-06 ENCOUNTER — Ambulatory Visit: Payer: Medicare Other | Admitting: *Deleted

## 2020-12-06 DIAGNOSIS — I5022 Chronic systolic (congestive) heart failure: Secondary | ICD-10-CM

## 2020-12-06 DIAGNOSIS — E1151 Type 2 diabetes mellitus with diabetic peripheral angiopathy without gangrene: Secondary | ICD-10-CM | POA: Diagnosis not present

## 2020-12-06 DIAGNOSIS — I10 Essential (primary) hypertension: Secondary | ICD-10-CM

## 2020-12-06 DIAGNOSIS — F4321 Adjustment disorder with depressed mood: Secondary | ICD-10-CM | POA: Diagnosis not present

## 2020-12-06 DIAGNOSIS — E162 Hypoglycemia, unspecified: Secondary | ICD-10-CM
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
## 2020-12-06 NOTE — Patient Instructions (Signed)
Visit Information  PATIENT GOALS: Goals Addressed            This Visit's Progress   . (RNCM) Monitor and Manage My Blood Sugar-Diabetes Type 1       Timeframe:  Long-Range Goal Priority:  High Start Date:   12/06/20                          Expected End Date:  06/10/21                     Follow Up Date 12/18/2020    . Check blood sugar at prescribed times . Check blood sugar if I feel it is too high or too low . Take the blood sugar meter to all doctor visits  . Contact provider (Endocrinology or PCP) if you do not receive insulin pump sensor by Monday 12/09/20 . Consider eating more protein for breakfast to help prevent hypoglycemia . Make sure to have candy or glucose tablets on you at all times . Eat at least 3 meals a day with snack in between to help prevent hypoglycemia . Please call [employer] ( [phone]) as requested by Endocrinology to discuss insulin pump warranty . Consider Home Health Nursing/Therapy involvement   Why is this important?    Checking your blood sugar at home helps to keep it from getting very high or very low.   Writing the results in a diary or log helps the doctor know how to care for you.   Your blood sugar log should have the time, the date and the results.   Also, write down the amount of insulin or other medicine you take.   Other information like what you ate, exercise done and how you were feeling will also be helpful..     Notes:    Marland Kitchen (RNCM) Track and Manage Fluids and Swelling-Heart Failure       Timeframe:  Long-Range Goal Priority:  Medium Start Date:   12/06/20                          Expected End Date:  06/10/21                     Follow Up Date 12/18/20   . Call office if I gain more than 2 pounds in one day or 5 pounds in one week . Use salt in moderation . Weigh myself daily and write in log for provider review . Attend appointment with [facility] for [facility] Boot change . Take medications as prescribed . Contact  transportation agency to arrange transportation for 3/1 appointment with [facility]   Why is this important?    It is important to check your weight daily and watch how much salt and liquids you have.   It will help you to manage your heart failure.    Notes:     Patient verbalizes understanding of instructions provided today and agrees to view in [place].   The care management team will reach out to the patient again over the next 10 business days.   [name] RN, MSN RN Care Management Coordinator [place] [phone] [email or web address]   Preventing Hypoglycemia Hypoglycemia occurs when the level of sugar (glucose) in the blood is too low. Hypoglycemia can happen in people who do or do not have diabetes (diabetes mellitus). It can develop quickly, and it can be  a medical emergency. For most people with diabetes, a blood glucose level below 70 mg/dL (3.9 mmol/L) is considered hypoglycemia. Glucose is a type of sugar that provides the body's main source of energy. Certain hormones (insulin and glucagon) control the level of glucose in the blood. Insulin lowers blood glucose, and glucagon increases blood glucose. Hypoglycemia can result from having too much insulin in the bloodstream, or from not eating enough food that contains glucose. Your risk for hypoglycemia is higher:  If you take insulin or diabetes medicines to help lower your blood glucose or help your body make more insulin.  If you skip or delay a meal or snack.  If you are ill.  During and after exercise. You can prevent hypoglycemia by working with your health care provider to adjust your meal plan as needed and by taking other precautions. How can hypoglycemia affect me? Mild symptoms Mild hypoglycemia may not cause any symptoms. If you do have symptoms, they may include:  Hunger.  Anxiety.  Sweating and feeling clammy.  Dizziness or feeling  light-headed.  Sleepiness.  Nausea.  Increased heart rate.  Headache.  Blurry vision.  Irritability.  Tingling or numbness around the mouth, lips, or tongue.  A change in coordination.  Restless sleep. If mild hypoglycemia is not recognized and treated, it can quickly become moderate or severe hypoglycemia. Moderate symptoms Moderate hypoglycemia can cause:  Mental confusion and poor judgment.  Behavior changes.  Weakness.  Irregular heartbeat. Severe symptoms Severe hypoglycemia is a medical emergency. It can cause:  Fainting.  Seizures.  Loss of consciousness (coma).  Death. What nutrition changes can be made?  Work with your health care provider or diet and nutrition specialist (dietitian) to make a healthy meal plan that is right for you. Follow your meal plan carefully.  Eat meals at regular times.  If recommended by your health care provider, have snacks between meals.  Donot skip or delay meals or snacks. You can be at risk for hypoglycemia if you are not getting enough carbohydrates. What lifestyle changes can be made?  Work closely with your health care provider to manage your blood glucose. Make sure you know: ? Your goal blood glucose levels. ? How and when to check your blood glucose. ? The symptoms of hypoglycemia. It is important to treat it right away to keep it from becoming severe.  Do not drink alcohol on an empty stomach.  When you are ill, check your blood glucose more often than usual. Follow your sick day plan whenever you cannot eat or drink normally. Make this plan in advance with your health care provider.  Always check your blood glucose before, during, and after exercise.   How is this treated? This condition can often be treated by immediately eating or drinking something that contains sugar, such as:  Fruit juice, 4-6 oz (120-150 mL).  Regular (not diet) soda, 4-6 oz (120-150 mL).  Low-fat milk, 4 oz (120 mL).  Several  pieces of hard candy.  Sugar or honey, 1 Tbsp (15 mL). Treating hypoglycemia if you have diabetes If you are alert and able to swallow safely, follow the 15:15 rule:  Take 15 grams of a rapid-acting carbohydrate. Talk with your health care provider about how much you should take.  Rapid-acting options include: ? Glucose pills (take 15 grams). ? 6-8 pieces of hard candy. ? 4-6 oz (120-150 mL) of fruit juice. ? 4-6 oz (120-150 mL) of regular (not diet) soda.  Check your blood glucose  15 minutes after you take the carbohydrate.  If the repeat blood glucose level is still at or below 70 mg/dL (3.9 mmol/L), take 15 grams of a carbohydrate again.  If your blood glucose level does not increase above 70 mg/dL (3.9 mmol/L) after 3 tries, seek emergency medical care.  After your blood glucose level returns to normal, eat a meal or a snack within 1 hour. Treating severe hypoglycemia Severe hypoglycemia is when your blood glucose level is at or below 54 mg/dL (3 mmol/L). Severe hypoglycemia is a medical emergency. Get medical help right away. If you have severe hypoglycemia and you cannot eat or drink, you may need an injection of glucagon. A family member or close friend should learn how to check your blood glucose and how to give you a glucagon injection. Ask your health care provider if you need to have an emergency glucagon injection kit available. Severe hypoglycemia may need to be treated in a hospital. The treatment may include getting glucose through an IV. You may also need treatment for the cause of your hypoglycemia. Where to find more information  American Diabetes Association: www.diabetes.[employer] of Diabetes and Digestive and Kidney Diseases: [employer] Contact a health care provider if:  You have problems keeping your blood glucose in your target range.  You have frequent episodes of hypoglycemia. Get help right away if:  You continue to have  hypoglycemia symptoms after eating or drinking something containing glucose.  Your blood glucose level is at or below 54 mg/dL (3 mmol/L).  You faint.  You have a seizure. These symptoms may represent a serious problem that is an emergency. Do not wait to see if the symptoms will go away. Get medical help right away. Call your local emergency services (911 in the [place] Summary  Know the symptoms of hypoglycemia, and when you are at risk for it (such as during exercise or when you are sick). Check your blood glucose often when you are at risk for hypoglycemia.  Hypoglycemia can develop quickly, and it can be dangerous if it is not treated right away. If you have a history of severe hypoglycemia, make sure you know how to use your glucagon injection kit.  Make sure you know how to treat hypoglycemia. Keep a carbohydrate snack available when you may be at risk for hypoglycemia. This information is not intended to replace advice given to you by your health care provider. Make sure you discuss any questions you have with your health care provider. Document Revised: 01/20/2019 Document Reviewed: 05/26/2017 Elsevier Patient Education  2021 [place].  Hypoglycemia Hypoglycemia is when the sugar (glucose) level in your blood is too low. Low blood sugar can happen to people who have diabetes and people who do not have diabetes. Low blood sugar can happen quickly, and it can be an emergency. What are the causes? This condition happens most often in people who have diabetes and may be caused by:  Diabetes medicine.  Not eating enough, or not eating often enough.  Doing more physical activity.  Drinking alcohol on an empty stomach. If you do not have diabetes, hypoglycemia may be caused by:  A tumor in the pancreas.  Not eating enough, or not eating for long periods at a time (fasting).  A very bad infection or illness.  Problems after having weight loss (bariatric) surgery.  Kidney  failure or liver failure.  Certain medicines. What increases the risk? This condition is more likely to develop in people  who:  Have diabetes and take medicines to lower their blood sugar.  Abuse alcohol.  Have a very bad illness. What are the signs or symptoms? Symptoms depend on whether your low blood sugar is mild, moderate, or very low. Mild  Hunger.  Feeling worried or nervous (anxious).  Sweating and feeling clammy.  Feeling dizzy or light-headed.  Being sleepy or having trouble sleeping.  Feeling like you may vomit (nauseous).  A fast heartbeat.  A headache.  Blurry vision.  Being irritable or grouchy.  Tingling or loss of feeling (numbness) around your mouth, lips, or tongue.  Trouble with moving (coordination). Moderate  Confusion and poor judgment.  Behavior changes.  Weakness.  Uneven heartbeats. Very low Very low blood sugar (severe hypoglycemia) is a medical emergency. It can cause:  Fainting.  Jerky movements that you cannot control (seizure).  Loss of consciousness (coma).  Death. How is this treated? Treating low blood sugar Low blood sugar is often treated by eating or drinking something sugary right away. The snack should contain 15 grams of a fast-acting carb (carbohydrate). Options include:  4 oz (120 mL) of fruit juice.  4-6 oz (120-150 mL) of regular soda (not diet soda).  8 oz (240 mL) of low-fat milk.  Several pieces of hard candy. Check food labels to find out how many to eat for 15 grams.  1 Tbsp (15 mL) of sugar or honey. Treating low blood sugar if you have diabetes If you can think clearly and swallow safely, follow the 15:15 rule:  Take 15 grams of a fast-acting carb. Talk with your doctor about how much you should take.  Always keep a source of fast-acting carb with you, such as: ? Sugar tablets (glucose pills). Take 4 pills. ? Several pieces of hard candy. Check food labels to see how many pieces to eat for  15 grams. ? 4 oz (120 mL) of fruit juice. ? 4-6 oz (120-150 mL) of regular (not diet) soda. ? 1 Tbsp (15 mL) of honey or sugar.  Check your blood sugar 15 minutes after you take the carb.  If your blood sugar is still at or below 70 mg/dL (3.9 mmol/L), take 15 grams of a carb again.  If your blood sugar does not go above 70 mg/dL (3.9 mmol/L) after 3 tries, get help right away.  After your blood sugar goes back to normal, eat a meal or a snack within 1 hour.   Treating very low blood sugar If your blood sugar is at or below 54 mg/dL (3 mmol/L), you have very low blood sugar, or severe hypoglycemia. This is an emergency. Get medical help right away. If you have very low blood sugar and you cannot eat or drink, you will need to be given a hormone called glucagon. A family member or friend should learn how to check your blood sugar and how to give you glucagon. Ask your doctor if you need to have an emergency glucagon kit at home. Very low blood sugar may also need to be treated in a hospital. Follow these instructions at home: General instructions  Take over-the-counter and prescription medicines only as told by your doctor.  Stay aware of your blood sugar as told by your doctor.  If you drink alcohol: ? Limit how much you use to:  0-1 drink a day for nonpregnant women.  0-2 drinks a day for men. ? Be aware of how much alcohol is in your drink. In the [place] one drink equals  one 12 oz bottle of beer (355 mL), one 5 oz glass of wine (148 mL), or one 1 oz glass of hard liquor (44 mL).  Keep all follow-up visits as told by your doctor. This is important. If you have diabetes:  Always have a rapid-acting carb (15 grams) option with you to treat low blood sugar.  Follow your diabetes care plan as told by your doctor. Make sure you: ? Know the symptoms of low blood sugar. ? Check your blood sugar as often as told by your doctor. Always check it before and after exercise. ? Always check  your blood sugar before you drive. ? Take your medicines as told. ? Follow your meal plan. ? Eat on time. Do not skip meals.  Share your diabetes care plan with: ? Your work or school. ? People you live with.  Carry a card or wear jewelry that says you have diabetes.   Contact a doctor if:  You have trouble keeping your blood sugar in your target range.  You have low blood sugar often. Get help right away if:  You still have symptoms after you eat or drink something that contains 15 grams of fast-acting carb and you cannot get your blood sugar above 70 mg/dL by following the 15:15 rule.  Your blood sugar is at or below 54 mg/dL (3 mmol/L).  You have a seizure.  You faint. These symptoms may be an emergency. Do not wait to see if the symptoms will go away. Get medical help right away. Call your local emergency services (911 in the [place] Do not drive yourself to the hospital. Summary  Hypoglycemia happens when the level of sugar (glucose) in your blood is too low.  Low blood sugar can happen to people who have diabetes and people who do not have diabetes. Low blood sugar can happen quickly, and it can be an emergency.  Make sure you know the symptoms of low blood sugar and know how to treat it.  Always keep a source of sugar (fast-acting carb) with you to treat low blood sugar. This information is not intended to replace advice given to you by your health care provider. Make sure you discuss any questions you have with your health care provider. Document Revised: 08/23/2019 Document Reviewed: 08/23/2019 Elsevier Patient Education  2021 [employer].

## 2020-12-08 ENCOUNTER — Encounter: Payer: Self-pay | Admitting: *Deleted

## 2020-12-09 ENCOUNTER — Telehealth: Payer: Self-pay

## 2020-12-09 NOTE — Telephone Encounter (Signed)
   Telephone encounter was:  Unsuccessful.  12/09/2020 Name: Gary [name] MRN: [id number] DOB: 08/15/1946  Unsuccessful outbound call made today to assist with:  Transportation Needs   Outreach Attempt:  1st Attempt  A HIPAA compliant voice message was left requesting a return call.  Instructed patient to contact dial a ride for all non [facility] appointments at [phone]. Emailed transportation details for 12/11/20 appointment again.  Denys Labree, AAS Paralegal, [place] . Embedded Care Coordination [facility]  [address], [place] [address] ??[email or web address]  ?? [phone]   [email or web address]

## 2020-12-10 DIAGNOSIS — S63104D Unspecified dislocation of right thumb, subsequent encounter: Secondary | ICD-10-CM | POA: Diagnosis not present

## 2020-12-11 ENCOUNTER — Other Ambulatory Visit: Payer: Self-pay

## 2020-12-11 ENCOUNTER — Ambulatory Visit (INDEPENDENT_AMBULATORY_CARE_PROVIDER_SITE_OTHER): Payer: Medicare Other | Admitting: Nurse Practitioner

## 2020-12-11 ENCOUNTER — Encounter (INDEPENDENT_AMBULATORY_CARE_PROVIDER_SITE_OTHER): Payer: Self-pay | Admitting: Nurse Practitioner

## 2020-12-11 VITALS — BP 114/64 | HR 87 | Ht 70.0 in | Wt 235.0 lb

## 2020-12-11 DIAGNOSIS — I890 Lymphedema, not elsewhere classified: Secondary | ICD-10-CM | POA: Diagnosis not present

## 2020-12-11 DIAGNOSIS — E7800 Pure hypercholesterolemia, unspecified: Secondary | ICD-10-CM

## 2020-12-11 DIAGNOSIS — I10 Essential (primary) hypertension: Secondary | ICD-10-CM

## 2020-12-12 NOTE — Progress Notes (Signed)
Subjective:    [name], male    DOB: 21-May-1946, 75 y.o.   MRN: [id number] Chief Complaint  Patient presents with  . Follow-up    BIL unna boot check    [name] is a 75 year old male that presents today for evaluation of lower extremity edema following Unna wraps.  The patient is doing well thus far.  The swelling is well controlled and there are no new ulcerations.  The patient however has been these current wraps for approximately 2 weeks.  Unfortunately prior to his follow-up visit last week the patient had a fall.  He had a syncopal event due to likely low blood sugar levels and was unable to make his appointment.  The patient is working with his PCP to obtain a device that monitors his blood sugar level in the hope to have less low blood sugar levels.  He denies any fever or chills   Review of Systems  Cardiovascular: Positive for leg swelling.  Skin: Positive for color change.  All other systems reviewed and are negative.      Objective:   Physical Exam Vitals reviewed.  HENT:     Head: Normocephalic.  Cardiovascular:     Rate and Rhythm: Normal rate.     Pulses: Normal pulses.  Pulmonary:     Effort: Pulmonary effort is normal.  Musculoskeletal:     Right lower leg: Edema present.     Left lower leg: Edema present.  Neurological:     Mental Status: He is alert and oriented to person, place, and time.  Psychiatric:        Mood and Affect: Mood normal.        Behavior: Behavior normal.        Thought Content: Thought content normal.        Judgment: Judgment normal.     BP 114/64   Pulse 87   Ht 5\' 10"  (1.778 m)   Wt 235 lb (106.6 kg)   BMI 33.72 kg/m   Past Medical History:  Diagnosis Date  . CHF (congestive heart failure) ([place])   . Depression   . 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 Gastroparesis   . Graves disease   . Hypercholesterolemia   . Hypertension   . Mantle cell  lymphoma ([place]) 07/2014  . Peripheral neuropathy   . Shingles     Social History   Socioeconomic History  . Marital status: Married    Spouse name: Not on file  . Number of children: 1  . Years of education: 46  . Highest education level: 12th grade  Occupational History  . Occupation: Retired  Tobacco Use  . Smoking status: Former Research scientist (life sciences)  . Smokeless tobacco: Current User    Types: Chew  Vaping Use  . Vaping Use: Never used  Substance and Sexual Activity  . Alcohol use: Yes    Alcohol/week: 3.0 standard drinks    Types: 3 Cans of beer per week    Comment: nightly  . Drug use: Never  . Sexual activity: Not Currently  Other Topics Concern  . Not on file  Social History Narrative  . Not on file   Social Determinants of Health   Financial Resource Strain: Medium Risk  . Difficulty of Paying Living Expenses: Somewhat hard  Food Insecurity: No Food Insecurity  . Worried About Charity fundraiser in the Last Year: Never true  . Ran  Out of Food in the Last Year: Never true  Transportation Needs: Unmet Transportation Needs  . Lack of Transportation (Medical): Yes  . Lack of Transportation (Non-Medical): No  Physical Activity: Inactive  . Days of Exercise per Week: 0 days  . Minutes of Exercise per Session: 0 min  Stress: Stress Concern Present  . Feeling of Stress : To some extent  Social Connections: Socially Isolated  . Frequency of Communication with Friends and Family: More than three times a week  . Frequency of Social Gatherings with Friends and Family: Twice a week  . Attends Religious Services: Never  . Active Member of Clubs or Organizations: No  . Attends Archivist Meetings: Never  . Marital Status: Widowed  Intimate Partner Violence: Not At Risk  . Fear of Current or Ex-Partner: No  . Emotionally Abused: No  . Physically Abused: No  . Sexually Abused: No    Past Surgical History:  Procedure Laterality Date  . CHOLECYSTECTOMY    .  NEPHRECTOMY     right  . PACEMAKER INSERTION N/A 12/07/2017   Procedure: INSERTION PACEMAKER DUAL CHAMBER INITIAL INSERT;  Surgeon: Isaias Cowman, MD;  Location: [facility];  Service: Cardiovascular;  Laterality: N/A;  . PORTA CATH INSERTION N/A 11/03/2017   Procedure: PORTA CATH INSERTION;  Surgeon: Katha Cabal, MD;  Location: [facility];  Service: Cardiovascular;  Laterality: N/A;    Family History  Problem Relation Age of Onset  . Breast cancer Mother   . Diabetes Father   . Cancer Father        Lymphoma  . Alcohol abuse Maternal Uncle   . Diabetes Sister   . Heart disease Other        grandfather    Allergies  Allergen Reactions  . Rofecoxib Nausea Only    CBC Latest Ref Rng & Units 07/29/2020 06/24/2020 01/24/2020  WBC 4.0 - 10.5 K/uL 6.1 6.3 5.7  Hemoglobin 13.0 - 17.0 g/dL 13.9 13.1 13.5  Hematocrit 39.0 - 52.0 % 40.0 39.0 40.8  Platelets 150 - 400 K/uL 102(L) 131.0(L) 118(L)      CMP     Component Value Date/Time   NA 143 10/24/2020 1455   NA 138 07/15/2014 0515   K 3.6 10/24/2020 1455   K 3.5 01/16/2015 1429   CL 102 10/24/2020 1455   CL 110 (H) 07/15/2014 0515   CO2 31 10/24/2020 1455   CO2 24 07/15/2014 0515   GLUCOSE 117 (H) 10/24/2020 1455   GLUCOSE 145 (H) 07/15/2014 0515   BUN 13 10/24/2020 1455   BUN 17 07/15/2014 0515   CREATININE 1.40 10/24/2020 1455   CREATININE 0.89 01/16/2015 1429   CALCIUM 7.9 (L) 10/24/2020 1455   CALCIUM 7.1 (L) 07/15/2014 0515   PROT 7.1 07/29/2020 0952   PROT 6.2 (L) 10/22/2014 1000   ALBUMIN 3.4 (L) 07/29/2020 0952   ALBUMIN 2.6 (L) 10/22/2014 1000   AST 24 07/29/2020 0952   AST 19 10/22/2014 1000   ALT 16 07/29/2020 0952   ALT 21 10/22/2014 1000   ALKPHOS 115 07/29/2020 0952   ALKPHOS 118 (H) 10/22/2014 1000   BILITOT 1.1 07/29/2020 0952   BILITOT 0.8 10/22/2014 1000   GFRNONAA 32 (L) 07/29/2020 0952   GFRNONAA >60 01/16/2015 1429   GFRAA 41 (L) 01/24/2020 0925   GFRAA >60 01/16/2015  1429     No results found.     Assessment & Plan:   1. Lymphedema Despite having his Unna wraps  in place for an extended time, the the patient's legs are doing well.  The swelling is well controlled and no significant new ulceration seen.  The patient did have a significant amount of dried skin over his lower extremities.  The majority of this was debrided in office.  He tolerated this well.  The patient will continue in [place] wraps to be changed on a weekly basis.  We will have the patient return to the office in 4 weeks for evaluation.  2. Essential hypertension, benign Continue antihypertensive medications as already ordered, these medications have been reviewed and there are no changes at this time.   3. Hypercholesterolemia Continue statin as ordered and reviewed, no changes at this time    Current Outpatient Medications on File Prior to Visit  Medication Sig Dispense Refill  . ACCU-CHEK AVIVA PLUS test strip USE AS INSTRUCTED. USE 1 STRIP EVERY 3 HOURS . ACCU-CHECK AVIVA PLUS TEST STRIP. DX E10.649  12  . acetaminophen (TYLENOL) 500 MG tablet Take 500 mg by mouth every 6 (six) hours as needed.     . carvedilol (COREG) 3.125 MG tablet Take 1 tablet by mouth 2 (two) times daily with a meal.    . cefdinir (OMNICEF) 300 MG capsule TAKE 1 CAPSULE BY MOUTH 2 TIMES DAILY FOR 7 DAYS.    . furosemide (LASIX) 20 MG tablet Take 40 mg by mouth 2 (two) times daily. 2 tablets in the morning 1 tablet in the afternoon as needed    . gabapentin (NEURONTIN) 100 MG capsule Take 1 capsule (100 mg total) by mouth at bedtime. 30 capsule 2  . GLUCAGON EMERGENCY 1 MG injection     . glucagon, human recombinant, (GLUCAGEN) 1 MG injection Use as directed    . glucose 4 GM chewable tablet Chew 1 tablet by mouth once as needed for low blood sugar.    Marland Kitchen glucose blood test strip Use 8 (eight) times daily as directed. BAYER CONTOUR NEXT TEST STRIPS E10.649    . insulin aspart (NOVOLOG) 100 UNIT/ML injection  Basal rate 12am 0.8 units per hour, 9am 0.9 units per hour. (total basal insulin 20.7 units). Carbohydrate ratio 1 units for every 8gm of carbohydrate. Correction factor 1 units for every 50mg /dl over target cbg. Target CBG 80-120 1 vial 12  . lisinopril (ZESTRIL) 20 MG tablet Take by mouth.    . nystatin cream (MYCOSTATIN) Apply 1 application topically 2 (two) times daily. 30 g 0  . potassium chloride (KLOR-CON) 10 MEQ tablet TAKE 1 TABLET BY MOUTH TWICE DAILY FOR 3 DAYS THEN CONTINUE ONCE DAILY 30 tablet 0  . pravastatin (PRAVACHOL) 40 MG tablet Take 40 mg by mouth daily.     . prochlorperazine (COMPAZINE) 10 MG tablet TAKE 1 TABLET (10 MG TOTAL) BY MOUTH EVERY 6 (SIX) HOURS AS NEEDED (NAUSEA OR VOMITING).    . vitamin B-12 (CYANOCOBALAMIN) 250 MCG tablet Take 250 mcg by mouth daily.    . Vitamin D, Cholecalciferol, 10 MCG (400 UNIT) TABS Take by mouth.     Current Facility-Administered Medications on File Prior to Visit  Medication Dose Route Frequency Provider Last Rate Last Admin  . heparin lock flush 100 unit/mL  500 Units Intravenous Once Lloyd Huger, MD      . sodium chloride flush (NS) 0.9 % injection 10 mL  10 mL Intravenous PRN Lloyd Huger, MD   10 mL at 02/01/18 0829  . sodium chloride flush (NS) 0.9 % injection 10 mL  10  mL Intravenous PRN Lloyd Huger, MD   10 mL at 04/05/18 0800    There are no Patient Instructions on file for this visit. No follow-ups on file.   Kris Hartmann, NP

## 2020-12-13 ENCOUNTER — Ambulatory Visit (INDEPENDENT_AMBULATORY_CARE_PROVIDER_SITE_OTHER): Payer: Medicare Other | Admitting: *Deleted

## 2020-12-13 ENCOUNTER — Ambulatory Visit: Payer: Medicare Other | Admitting: Pharmacist

## 2020-12-13 DIAGNOSIS — E7800 Pure hypercholesterolemia, unspecified: Secondary | ICD-10-CM | POA: Diagnosis not present

## 2020-12-13 DIAGNOSIS — I10 Essential (primary) hypertension: Secondary | ICD-10-CM

## 2020-12-13 DIAGNOSIS — E1151 Type 2 diabetes mellitus with diabetic peripheral angiopathy without gangrene: Secondary | ICD-10-CM

## 2020-12-13 DIAGNOSIS — I5022 Chronic systolic (congestive) heart failure: Secondary | ICD-10-CM | POA: Diagnosis not present

## 2020-12-13 DIAGNOSIS — F4321 Adjustment disorder with depressed mood: Secondary | ICD-10-CM

## 2020-12-13 DIAGNOSIS — N1832 Chronic kidney disease, stage 3b: Secondary | ICD-10-CM

## 2020-12-13 DIAGNOSIS — I429 Cardiomyopathy, unspecified: Secondary | ICD-10-CM

## 2020-12-13 DIAGNOSIS — I428 Other cardiomyopathies: Secondary | ICD-10-CM
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
## 2020-12-13 NOTE — Patient Instructions (Signed)
Visit Information  I am so pleased with your progress [name].  Thank you for your wonderful sense of humor.  I always enjoy speaking with you.  PATIENT GOALS: Goals Addressed              This Visit's Progress   .  Secure Transportation, [place] and Reduce/Manage Symptoms of Grief and Depression. (pt-stated)   On track     Timeframe:  Short-Term Goal Priority:  High Start Date:  11/27/2020                        Expected End Date:  12/25/2020                 . Continue to utilize Care Guide services for assistance with arranging transportation to and from physician appointments. . Continue to receive transportation assistance through [facility] [phone]) for all [facility] affiliated physician appointments. . Continue bi-weekly telephone sessions with CSW to discuss feelings of grief/loss, and to reduce and manage symptoms of depression.     . Continue to work with [name], Adult Protective Services Case Worker with the [facility] [facility] to obtain resources/assistance obtaining affordable handicapped accessible senior living housing, in [place], [place]. Marland Kitchen Continue to review EMMI Educational Material, pertaining specifically to: "Dealing with Death", "Helping You Cope", "Tips for How to Help Your Mood", "Adjustment Disorder", and "Cognitive Behavioral Therapy".   . Review Aid and Attendance Benefits information mailed to you by CSW, including all of the following information: How to Apply for Aid and Attendance Benefits, Physician Statement, Statement in Support of Claim, Authorization to [employer], and Aid and Attendance Application.  Notify CSW if you change your mind about applying for services and need assistance with application completion and submission to [name].          Patient verbalizes understanding of instructions provided  today and agrees to view in [place].   Telephone follow up appointment with care management team member scheduled for:  12/25/2020 at 2:00pm.  Nat Christen LCSW Licensed Clinical Social Worker [place]  [phone]

## 2020-12-13 NOTE — Chronic Care Management (AMB) (Signed)
Chronic Care Management    Clinical Social Work Note  12/13/2020 Name: Gary [name] MRN: [id number] DOB: 01/26/1946  Gary [name] is a 74 y.o. year old male who is a primary care patient of [name], MD. The CCM team was consulted to assist the patient with chronic disease management and/or care coordination needs related to: Transportation Needs, [employer], Level of Care Concerns, Mental Health Counseling and Resources, Grief Counseling, and [place]..   Engaged with patient by telephone for follow up visit in response to provider referral for social work chronic care management and care coordination services.   Consent to Services:  The patient was given information about Chronic Care Management services, agreed to services, and gave verbal consent prior to initiation of services.  Please see initial visit note for detailed documentation.   Patient agreed to services and consent obtained.   Assessment: Review of patient past medical history, allergies, medications, and health status, including review of relevant consultants reports was performed today as part of a comprehensive evaluation and provision of chronic care management and care coordination services.     SDOH (Social Determinants of Health) assessments and interventions performed:  Housing, Transportation, Mental Health Counseling, Community Resource Referrals.  Advanced Directives Status: Not addressed in this encounter.  CCM Care Plan  Allergies  Allergen Reactions  . Rofecoxib Nausea Only    Outpatient Encounter Medications as of 12/13/2020  Medication Sig  . ACCU-CHEK AVIVA PLUS test strip USE AS INSTRUCTED. USE 1 STRIP EVERY 3 HOURS . ACCU-CHECK AVIVA PLUS TEST STRIP. DX E83.151  . acetaminophen (TYLENOL) 500 MG tablet Take 500 mg by mouth every 6 (six) hours as needed.   . carvedilol (COREG) 3.125 MG tablet Take 1 tablet by mouth 2 (two) times daily with a meal.   . cefdinir (OMNICEF) 300 MG capsule TAKE 1 CAPSULE BY MOUTH 2 TIMES DAILY FOR 7 DAYS.  . furosemide (LASIX) 20 MG tablet Take 40 mg by mouth 2 (two) times daily. 2 tablets in the morning 1 tablet in the afternoon as needed  . gabapentin (NEURONTIN) 100 MG capsule Take 1 capsule (100 mg total) by mouth at bedtime.  Marland Kitchen GLUCAGON EMERGENCY 1 MG injection   . glucagon, human recombinant, (GLUCAGEN) 1 MG injection Use as directed  . glucose 4 GM chewable tablet Chew 1 tablet by mouth once as needed for low blood sugar.  Marland Kitchen glucose blood test strip Use 8 (eight) times daily as directed. BAYER CONTOUR NEXT TEST STRIPS E10.649  . insulin aspart (NOVOLOG) 100 UNIT/ML injection Basal rate 12am 0.8 units per hour, 9am 0.9 units per hour. (total basal insulin 20.7 units). Carbohydrate ratio 1 units for every 8gm of carbohydrate. Correction factor 1 units for every 50mg /dl over target cbg. Target CBG 80-120  . lisinopril (ZESTRIL) 20 MG tablet Take by mouth.  . nystatin cream (MYCOSTATIN) Apply 1 application topically 2 (two) times daily.  . potassium chloride (KLOR-CON) 10 MEQ tablet TAKE 1 TABLET BY MOUTH TWICE DAILY FOR 3 DAYS THEN CONTINUE ONCE DAILY  . pravastatin (PRAVACHOL) 40 MG tablet Take 40 mg by mouth daily.   . prochlorperazine (COMPAZINE) 10 MG tablet TAKE 1 TABLET (10 MG TOTAL) BY MOUTH EVERY 6 (SIX) HOURS AS NEEDED (NAUSEA OR VOMITING).  . vitamin B-12 (CYANOCOBALAMIN) 250 MCG tablet Take 250 mcg by mouth daily.  . Vitamin D, Cholecalciferol, 10 MCG (400 UNIT) TABS Take by mouth.   Facility-Administered Encounter Medications as of 12/13/2020  Medication  .  heparin lock flush 100 unit/mL  . sodium chloride flush (NS) 0.9 % injection 10 mL  . sodium chloride flush (NS) 0.9 % injection 10 mL    Patient Active Problem List   Diagnosis Date Noted  . Hypokalemia 10/24/2020  . Injury by nail 08/15/2020  . Shingles 07/24/2020  . Rash 07/02/2020  . Colon cancer screening 07/02/2020  . Sick sinus  syndrome ([place]) 12/28/2019  . Acquired absence of kidney 09/13/2019  . Edema of lower extremity 09/13/2019  . 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 Proteinuria 09/13/2019  . Lower extremity pain, bilateral   . Diabetic foot ulcer associated with type 1 diabetes mellitus ([place])   . Acute on chronic systolic CHF (congestive heart failure) ([place])   . Cellulitis 08/18/2019  . Lower limb ulcer, calf ([place]) 08/01/2019  . Left hip pain 07/16/2019  . Pain due to onychomycosis of toenails of both feet 04/03/2019  . Swelling of limb 02/07/2019  . Pancytopenia ([place]) 12/03/2018  . CKD (chronic kidney disease) stage 3, GFR 30-59 ml/min (HCC) 09/16/2018  . Left shoulder pain 09/16/2018  . Chronic cholecystitis 12/29/2017  . Graves disease 12/29/2017  . Peripheral neuropathy 12/29/2017  . S/p nephrectomy 12/29/2017  . 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 Symptomatic bradycardia 12/02/2017  . AKI (acute kidney injury) ([place]) 12/02/2017  . Chronic systolic CHF (congestive heart failure) ([place]) 12/02/2017  . Saturday night paralysis 11/12/2017  . Hypoglycemia 10/28/2017  . Goals of care, counseling/discussion 10/24/2017  . Lymphedema 10/20/2017  . Iron deficiency anemia 09/11/2017  . Fall 08/13/2017  . Humerus fracture 08/13/2017  . Anemia 01/12/2017  . Bilateral carotid artery stenosis 06/29/2016  . Hand laceration 12/16/2015  . Adjustment disorder with depressed mood 08/11/2015  . Cardiomyopathy, idiopathic ([place]) 04/22/2015  . CAD in native artery 11/02/2014  . Mantle cell lymphoma ([place]) 08/03/2014  . History of colonic polyps 04/29/2014  . Irregular heart beat 04/22/2014  . SOB (shortness of breath) 04/22/2014  . Stress 03/21/2014  . PVC (premature ventricular contraction) 02/21/2014  . GERD (gastroesophageal reflux disease) 10/23/2013  . Gastroparesis 06/25/2013  . Chest pain 04/13/2013  . Thyroid disease  04/13/2013  . Diabetes ([place]) 04/13/2013  . Essential hypertension, benign 04/13/2013  . Hypercholesterolemia 04/13/2013    Conditions to be addressed/monitored: HTN, DMII, Depression, Grief and Loss. Limited social support, transportation, level of care concerns, ADL/IADL limitations, mental health concerns, social isolation, and limited access to caregiver.  Care Plan : LCSW Plan of Care  Updates made by Francis Gaines, LCSW since 12/13/2020 12:00 AM    Problem: Patient Requires Science writer, Obtaining Affordable Handicapped Accessible Senior Housing, and Reducing/Managing Symptoms of Grief and Depression.   Priority: High    Goal: Patient Requires Science writer, [place], and Reducing/Managing Symptoms of Grief and Depression.   Start Date: 11/27/2020  Expected End Date: 12/25/2020  This Visit's Progress: On track  Priority: High  Note:   Current barriers:   . Patient is in need of assistance with connecting to community resources for reliable transportation services and affordable handicapped accessible senior housing. . Acknowledges deficits with regards to available community agencies and resources. . Patient is unable to independently navigate community resource options without care coordination support through [place]. . Patient currently requires assistance reducing and managing symptoms of grief/loss and depression.  Clinical Goals: Over the next 30 days, patient will work with  CSW and Care Guide to address needs related to transportation, affordable handicapped accessible senior housing, counseling and supportive services for grief/loss and depression.  Clinical Interventions:  . Collaboration with [name], MD regarding development and update of comprehensive plan of care as evidenced by provider attestation and co-signature. Bertram Savin care team  collaboration (see longitudinal plan of care). . Assessment of needs and barriers that may be impacting patient's health and mental health. Nash Dimmer with appropriate clinical care team members regarding patient needs: Care Guide, Chronic Nurse Care Manager, [name] and Pharmacist, Catie [place]. . Other interventions provided: Solution-Focused Strategies, Grief Counseling, Emotional/Supportive Counseling, Psychotropic Medication Adherence Assessment, Sleep Hygiene, Problem Solving, Consulted with MD regarding Level of Care Concerns, Consulted with Adult Materials engineer - [name], with the [facility].  Patient Goals/Self-Care Activities:  . Continue to utilize Care Guide services for assistance with arranging transportation to and from physician appointments. . Continue to receive transportation assistance through [facility] [phone]) for all [facility] affiliated physician appointments. . Continue bi-weekly telephone sessions with CSW to discuss feelings of grief/loss, and to reduce and manage symptoms of depression.     . Continue to work with [name], Adult Protective Services Case Worker with the [place] to obtain resources/assistance obtaining affordable handicapped accessible senior living housing, in [place], [place]. Marland Kitchen Continue to review EMMI Educational Material, pertaining specifically to: "Dealing with Death", "Helping You Cope", "Tips for How to Help Your Mood", "Adjustment Disorder", and "Cognitive Behavioral Therapy".   . Review Aid and Attendance Benefits information mailed to you by CSW, including all of the following information: How to Apply for Aid and Attendance Benefits, Physician Statement, Statement in Support of Claim, Authorization to [employer], and Aid and Attendance Application.  Notify CSW if you change your mind about  applying for services and need assistance with application completion and submission to [name].       Follow Up Plan: Patient agreeable to having CSW follow-up with him again on 12/25/2020 at 2:00pm.          Follow Up Plan: CSW will follow up with patient by phone on 12/25/2020 at 2:00pm.      Nat Christen LCSW Licensed Clinical Social Worker [place]  [phone]
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
## 2020-12-18 ENCOUNTER — Ambulatory Visit: Payer: Medicare Other | Admitting: *Deleted

## 2020-12-18 ENCOUNTER — Ambulatory Visit (INDEPENDENT_AMBULATORY_CARE_PROVIDER_SITE_OTHER): Payer: Medicare Other | Admitting: Nurse Practitioner

## 2020-12-18 ENCOUNTER — Other Ambulatory Visit: Payer: Self-pay

## 2020-12-18 VITALS — BP 125/76 | HR 89 | Ht 70.0 in | Wt 234.0 lb

## 2020-12-18 DIAGNOSIS — L97201 Non-pressure chronic ulcer of unspecified calf limited to breakdown of skin: Secondary | ICD-10-CM | POA: Diagnosis not present

## 2020-12-18 DIAGNOSIS — I5022 Chronic systolic (congestive) heart failure: Secondary | ICD-10-CM

## 2020-12-18 DIAGNOSIS — I429 Cardiomyopathy, unspecified: Secondary | ICD-10-CM

## 2020-12-18 DIAGNOSIS — I428 Other cardiomyopathies: Secondary | ICD-10-CM

## 2020-12-18 DIAGNOSIS — E1151 Type 2 diabetes mellitus with diabetic peripheral angiopathy without gangrene: Secondary | ICD-10-CM

## 2020-12-18 NOTE — Progress Notes (Signed)
History of Present Illness  There is no documented history at this time  Assessments & Plan   There are no diagnoses linked to this encounter.    Additional instructions  Subjective:  Patient presents with venous ulcer of the Bilateral lower extremity.    Procedure:  3 layer unna wrap was placed Bilateral lower extremity.   Plan:   Follow up in one week.  

## 2020-12-18 NOTE — Chronic Care Management (AMB) (Signed)
Chronic Care Management   CCM RN Visit Note  12/18/2020 Name: Gary [name] MRN: [id number] DOB: Dec 10, 1945  Subjective: Gary [name] is a 75 y.o. year old male who is a primary care patient of [name], MD. The care management team was consulted for assistance with disease management and care coordination needs.    Engaged with patient by telephone for follow up visit in response to provider referral for case management and/or care coordination services.   Consent to Services:  The patient was given information about Chronic Care Management services, agreed to services, and gave verbal consent prior to initiation of services.  Please see initial visit note for detailed documentation.   Patient agreed to services and verbal consent obtained.   Assessment: Review of patient past medical history, allergies, medications, health status, including review of consultants reports, laboratory and other test data, was performed as part of comprehensive evaluation and provision of chronic care management services.   SDOH (Social Determinants of Health) assessments and interventions performed:    CCM Care Plan  Allergies  Allergen Reactions  . Rofecoxib Nausea Only    Outpatient Encounter Medications as of 12/18/2020  Medication Sig Note  . furosemide (LASIX) 40 MG tablet Take 40 mg by mouth 2 (two) times daily. 2 tablets in the morning 1 tablet in the afternoon as needed   . carvedilol (COREG) 3.125 MG tablet Take 1 tablet by mouth 2 (two) times daily with a meal.   . Cholecalciferol (VITAMIN D3) 50 MCG (2000 UT) CAPS Take by mouth.   . gabapentin (NEURONTIN) 100 MG capsule Take 1 capsule (100 mg total) by mouth at bedtime. (Patient not taking: No sig reported) 12/18/2020: Reports not taking due to patient stating upsetting his stomach and making him dizzy  . glucose 4 GM chewable tablet Chew 1 tablet by mouth once as needed for low blood sugar.   . insulin aspart (NOVOLOG) 100 UNIT/ML  injection Basal rate 12am 0.8 units per hour, 9am 0.9 units per hour. (total basal insulin 20.7 units). Carbohydrate ratio 1 units for every 8gm of carbohydrate. Correction factor 1 units for every 50mg /dl over target cbg. Target CBG 80-120   . nystatin cream (MYCOSTATIN) Apply 1 application topically 2 (two) times daily.   . potassium chloride (KLOR-CON) 10 MEQ tablet TAKE 1 TABLET BY MOUTH TWICE DAILY FOR 3 DAYS THEN CONTINUE ONCE DAILY 12/13/2020: Taking once daily   . pravastatin (PRAVACHOL) 40 MG tablet Take 40 mg by mouth daily.    . vitamin B-12 (CYANOCOBALAMIN) 1000 MCG tablet Take 1,000 mcg by mouth daily.    Facility-Administered Encounter Medications as of 12/18/2020  Medication  . heparin lock flush 100 unit/mL  . sodium chloride flush (NS) 0.9 % injection 10 mL  . sodium chloride flush (NS) 0.9 % injection 10 mL    Patient Active Problem List   Diagnosis Date Noted  . Hypokalemia 10/24/2020  . Injury by nail 08/15/2020  . Shingles 07/24/2020  . Rash 07/02/2020  . Colon cancer screening 07/02/2020  . Sick sinus syndrome ([place]) 12/28/2019  . Acquired absence of kidney 09/13/2019  . Edema of lower extremity 09/13/2019  . 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 Proteinuria 09/13/2019  . Lower extremity pain, bilateral   . Diabetic foot ulcer associated with type 1 diabetes mellitus ([place])   . Acute on chronic systolic CHF (congestive heart failure) ([place])   . Cellulitis 08/18/2019  .  Lower limb ulcer, calf ([place]) 08/01/2019  . Left hip pain 07/16/2019  . Pain due to onychomycosis of toenails of both feet 04/03/2019  . Swelling of limb 02/07/2019  . Pancytopenia ([place]) 12/03/2018  . CKD (chronic kidney disease) stage 3, GFR 30-59 ml/min (HCC) 09/16/2018  . Left shoulder pain 09/16/2018  . Chronic cholecystitis 12/29/2017  . Graves disease 12/29/2017  . Peripheral neuropathy 12/29/2017  . S/p  nephrectomy 12/29/2017  . 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 Symptomatic bradycardia 12/02/2017  . AKI (acute kidney injury) ([place]) 12/02/2017  . Chronic systolic CHF (congestive heart failure) ([place]) 12/02/2017  . Saturday night paralysis 11/12/2017  . Hypoglycemia 10/28/2017  . Goals of care, counseling/discussion 10/24/2017  . Lymphedema 10/20/2017  . Iron deficiency anemia 09/11/2017  . Fall 08/13/2017  . Humerus fracture 08/13/2017  . Anemia 01/12/2017  . Bilateral carotid artery stenosis 06/29/2016  . Hand laceration 12/16/2015  . Adjustment disorder with depressed mood 08/11/2015  . Cardiomyopathy, idiopathic ([place]) 04/22/2015  . CAD in native artery 11/02/2014  . Mantle cell lymphoma ([place]) 08/03/2014  . History of colonic polyps 04/29/2014  . Irregular heart beat 04/22/2014  . SOB (shortness of breath) 04/22/2014  . Stress 03/21/2014  . PVC (premature ventricular contraction) 02/21/2014  . GERD (gastroesophageal reflux disease) 10/23/2013  . Gastroparesis 06/25/2013  . Chest pain 04/13/2013  . Thyroid disease 04/13/2013  . Diabetes ([place]) 04/13/2013  . Essential hypertension, benign 04/13/2013  . Hypercholesterolemia 04/13/2013    Conditions to be addressed/monitored:CHF and Diabetes  Care Plan : Heart Failure (Adult)  Updates made by Leona Singleton, RN since 12/18/2020 12:00 AM  Problem: Disease Progression (Heart Failure)   Goal: Patient will report no heart failure exacerbations in the next 90 days   Start Date: 12/06/2020  Expected End Date: 03/06/2021  This Visit's Progress: Not on track  Priority: Medium  Current Barriers:  Marland Kitchen Knowledge deficit related to basic heart failure pathophysiology and self care management as evidenced by continued lower extremity edema requiring Unna Boots.  Just returned from having [employer] changed (weekly).  Continues to deny any increase in shortness of breath.  Does report he has not felt well over the last week, can not pin  point what is wrong just stating, weak and tired.  States he has not been weighing himself daily because he is wobbly on the scale.  Discussed having walker in front of scale to help with balance.  Weight this morning was 236 pounds. . Patient does not have transportation to provider appointments . Transportation Barriers; going to use [facility] transportation for appointments within [place] system and using [employer] for other medical appointments . Lacks social connections . Unable to perform IADLs independently Case Manager Clinical Goal(s):  . patient will verbalize understanding of Heart Failure Action Plan and when to call doctor . patient will take all Heart Failure mediations as prescribed . patient will weigh daily and record (notifying MD of 3 lb weight gain over night or 5 lb in a week) Interventions:  . Collaboration with [name], MD regarding development and update of comprehensive plan of care as evidenced by provider attestation and co-signature . Inter-disciplinary care team collaboration (see longitudinal plan of care) . Provided verbal education on low sodium diet . Advised patient to weigh each morning after emptying bladder . Discussed importance of daily weight and advised patient to weigh and record daily and encouraged patient to do so; encouraged patient to use walker in front of scale  to help balance while standing on scale . Reviewed signs and symptoms of heart failure exacerbation . Encouraged elevation of lower extremities as able . Encouraged continued use of [employer] and [employer] transportation . Discussed medications and encouraged medication compliance Patient Goals/Self-Care Activities . Call office if I gain more than 2 pounds in one day or 5 pounds in one week . Use salt in moderation, low salt heart healthy diabetic diet . Weigh myself daily and write in log for provider review . Take medications as prescribed . Place walker in front of  scale to help balance self while standing to weigh Follow Up Plan: The care management team will reach out to the patient again over the next 20 business days.    Care Plan : Diabetes  Updates made by Leona Singleton, RN since 12/18/2020 12:00 AM  Problem: Hypoglycemia causing syncopal episodes   Priority: High  Long-Range Goal: Patient will report less episodes of hypoglycemia within the next 30 days   Start Date: 12/06/2020  Expected End Date: 06/10/2021  This Visit's Progress: Not on track  Priority: High  Objective:  Lab Results  Component Value Date   HGBA1C 6.8 (H) 06/24/2020 .   Lab Results  Component Value Date   CREATININE 1.40 10/24/2020   CREATININE 1.78 (H) 08/06/2020   CREATININE 2.01 (H) 07/29/2020   Current Barriers:  Marland Kitchen Knowledge Deficits related to basic Diabetes pathophysiology and self care/management as evidenced by multiple syncopal episodes related to hypoglycemia.  Continues to report hypoglycemia episodes, but denies any syncope within the last 2 weeks.  States his rages over the past week have been 100-150's in the mornings and afternoons, but ranges of 70-100's in the evenings.  Denies the need to having to pause his insulin pump in the last week.  Continues to report piecing together his dinnertime meal, due to decrease in appetite.  Discussed with patient drinking Ensure or boost or some other supplement. . Limited Social Support . Trouble getting insulin pump supplies-reports he now has insulin pump sensors Case Manager Clinical Goal(s):  . patient will demonstrate improved adherence to prescribed treatment plan for diabetes self care/management as evidenced by: at least 4 times a day monitoring and recording of CBG, adherence to ADA/ carb modified diet, adherence to prescribed medication regimen, contacting provider for new or worsened symptoms or questions Interventions:  . Collaboration with [name], MD regarding development and update of  comprehensive plan of care as evidenced by provider attestation and co-signature . Inter-disciplinary care team collaboration (see longitudinal plan of care) . Provided education to patient about basic DM disease process . Discussed medications and encouraged medication compliance . Discussed plans with patient for ongoing care management follow up and provided patient with direct contact information for care management team . Encouraged continued participation with CCM Social Worker and CCM Pharmacist to consider starting [employer] benefits . Discussed dangers of hypoglycemia and treatment options, need for immediate sugar replacement with juice, sugar, soda in addition to snack or candy bar . Reviewed scheduled/upcoming provider appointments including: Endocrinology appointment on 4/14, Vascular appointment on 3/16,  . Rediscussed possible Home Health Nursing or Therapy referral (patient still declines at this time, but states he will continue to think about it) . Reviewed Endocrinologist note and instructions for patient to call Insulin Pump [employer] to verify warranty, again offered to assist patient in making this call today(continues to decline assistance, stated he would call himself) . Reviewed patient's meals for today  and discussed other food options and encouraged patient to try Boost or some other protein drink replacement in the evenings when blood sugars are lower and he is not planning to eat large meal Patient Goals/Self-Care Activities . Check blood sugar at least 5 times a day . Check blood sugar if I feel it is too high or too low . Take the blood sugar meter and log to all doctor visits  . Consider drinking Boost or other supplement in the evenings if not eating a complete meal . Make sure to have candy or glucose tablets on you at all times; try drinking coke along with eating snack to treat hypoglycemia . Eat at least 3 meals a day with snack in between to help prevent  hypoglycemia . Please call [employer] ( [phone]) as requested by Endocrinology to discuss insulin pump warranty . Please consider Home Health Nursing/Therapy involvement Follow Up Plan: The care management team will reach out to the patient again over the next 20 business days.       Plan:The care management team will reach out to the patient again over the next 20 business days.  [name] RN, MSN RN Care Management Coordinator [place] [phone] [email or web address]

## 2020-12-18 NOTE — Patient Instructions (Addendum)
Visit Information  PATIENT GOALS: Goals Addressed            This Visit's Progress   . (RNCM) Monitor and Manage My Blood Sugar-Diabetes Type 1   Not on track    Timeframe:  Long-Range Goal Priority:  High Start Date:   12/06/20                          Expected End Date:  06/10/21                     Follow Up Date 01/03/2021    . Check blood sugar at least 5 times a day . Check blood sugar if I feel it is too high or too low . Take the blood sugar meter and log to all doctor visits  . Consider drinking Boost or other supplement in the evenings if not eating a complete meal . Make sure to have candy or glucose tablets on you at all times; try drinking coke along with eating snack to treat hypoglycemia . Eat at least 3 meals a day with snack in between to help prevent hypoglycemia . Please call [employer] ( [phone]) as requested by Endocrinology to discuss insulin pump warranty . Please consider Home Health Nursing/Therapy involvement   Why is this important?    Checking your blood sugar at home helps to keep it from getting very high or very low.   Writing the results in a diary or log helps the doctor know how to care for you.   Your blood sugar log should have the time, the date and the results.   Also, write down the amount of insulin or other medicine you take.   Other information like what you ate, exercise done and how you were feeling will also be helpful..     Notes:     Marland Kitchen (RNCM) Track and Manage Fluids and Swelling-Heart Failure   Not on track    Timeframe:  Long-Range Goal Priority:  Medium Start Date:   12/06/20                          Expected End Date:  06/10/21                     Follow Up Date 01/03/21   . Call office if I gain more than 2 pounds in one day or 5 pounds in one week . Use salt in moderation, low salt heart healthy diabetic diet . Weigh myself daily and write in log for provider review . Take medications as prescribed . Place walker  in front of scale to help balance self while standing to weigh   Why is this important?    It is important to check your weight daily and watch how much salt and liquids you have.   It will help you to manage your heart failure.    Notes:        Patient verbalizes understanding of instructions provided today and agrees to view in [place].   The care management team will reach out to the patient again over the next 20 business days.   [name] RN, MSN RN Care Management Coordinator [place] [phone] [email or web address]    Preventing Hypoglycemia Hypoglycemia occurs when the level of sugar (glucose) in the blood is too low. Hypoglycemia can happen in people who do or do not have diabetes (  diabetes mellitus). It can develop quickly, and it can be a medical emergency. For most people with diabetes, a blood glucose level below 70 mg/dL (3.9 mmol/L) is considered hypoglycemia. Glucose is a type of sugar that provides the body's main source of energy. Certain hormones (insulin and glucagon) control the level of glucose in the blood. Insulin lowers blood glucose, and glucagon increases blood glucose. Hypoglycemia can result from having too much insulin in the bloodstream, or from not eating enough food that contains glucose. Your risk for hypoglycemia is higher:  If you take insulin or diabetes medicines to help lower your blood glucose or help your body make more insulin.  If you skip or delay a meal or snack.  If you are ill.  During and after exercise. You can prevent hypoglycemia by working with your health care provider to adjust your meal plan as needed and by taking other precautions. How can hypoglycemia affect me? Mild symptoms Mild hypoglycemia may not cause any symptoms. If you do have symptoms, they may include:  Hunger.  Anxiety.  Sweating and feeling clammy.  Dizziness or feeling  light-headed.  Sleepiness.  Nausea.  Increased heart rate.  Headache.  Blurry vision.  Irritability.  Tingling or numbness around the mouth, lips, or tongue.  A change in coordination.  Restless sleep. If mild hypoglycemia is not recognized and treated, it can quickly become moderate or severe hypoglycemia. Moderate symptoms Moderate hypoglycemia can cause:  Mental confusion and poor judgment.  Behavior changes.  Weakness.  Irregular heartbeat. Severe symptoms Severe hypoglycemia is a medical emergency. It can cause:  Fainting.  Seizures.  Loss of consciousness (coma).  Death. What nutrition changes can be made?  Work with your health care provider or diet and nutrition specialist (dietitian) to make a healthy meal plan that is right for you. Follow your meal plan carefully.  Eat meals at regular times.  If recommended by your health care provider, have snacks between meals.  Donot skip or delay meals or snacks. You can be at risk for hypoglycemia if you are not getting enough carbohydrates. What lifestyle changes can be made?  Work closely with your health care provider to manage your blood glucose. Make sure you know: ? Your goal blood glucose levels. ? How and when to check your blood glucose. ? The symptoms of hypoglycemia. It is important to treat it right away to keep it from becoming severe.  Do not drink alcohol on an empty stomach.  When you are ill, check your blood glucose more often than usual. Follow your sick day plan whenever you cannot eat or drink normally. Make this plan in advance with your health care provider.  Always check your blood glucose before, during, and after exercise.   How is this treated? This condition can often be treated by immediately eating or drinking something that contains sugar, such as:  Fruit juice, 4-6 oz (120-150 mL).  Regular (not diet) soda, 4-6 oz (120-150 mL).  Low-fat milk, 4 oz (120 mL).  Several  pieces of hard candy.  Sugar or honey, 1 Tbsp (15 mL). Treating hypoglycemia if you have diabetes If you are alert and able to swallow safely, follow the 15:15 rule:  Take 15 grams of a rapid-acting carbohydrate. Talk with your health care provider about how much you should take.  Rapid-acting options include: ? Glucose pills (take 15 grams). ? 6-8 pieces of hard candy. ? 4-6 oz (120-150 mL) of fruit juice. ? 4-6 oz (120-150 mL)  of regular (not diet) soda.  Check your blood glucose 15 minutes after you take the carbohydrate.  If the repeat blood glucose level is still at or below 70 mg/dL (3.9 mmol/L), take 15 grams of a carbohydrate again.  If your blood glucose level does not increase above 70 mg/dL (3.9 mmol/L) after 3 tries, seek emergency medical care.  After your blood glucose level returns to normal, eat a meal or a snack within 1 hour. Treating severe hypoglycemia Severe hypoglycemia is when your blood glucose level is at or below 54 mg/dL (3 mmol/L). Severe hypoglycemia is a medical emergency. Get medical help right away. If you have severe hypoglycemia and you cannot eat or drink, you may need an injection of glucagon. A family member or close friend should learn how to check your blood glucose and how to give you a glucagon injection. Ask your health care provider if you need to have an emergency glucagon injection kit available. Severe hypoglycemia may need to be treated in a hospital. The treatment may include getting glucose through an IV. You may also need treatment for the cause of your hypoglycemia. Where to find more information  American Diabetes Association: www.diabetes.[employer] of Diabetes and Digestive and Kidney Diseases: [employer] Contact a health care provider if:  You have problems keeping your blood glucose in your target range.  You have frequent episodes of hypoglycemia. Get help right away if:  You continue to have  hypoglycemia symptoms after eating or drinking something containing glucose.  Your blood glucose level is at or below 54 mg/dL (3 mmol/L).  You faint.  You have a seizure. These symptoms may represent a serious problem that is an emergency. Do not wait to see if the symptoms will go away. Get medical help right away. Call your local emergency services (911 in the [place] Summary  Know the symptoms of hypoglycemia, and when you are at risk for it (such as during exercise or when you are sick). Check your blood glucose often when you are at risk for hypoglycemia.  Hypoglycemia can develop quickly, and it can be dangerous if it is not treated right away. If you have a history of severe hypoglycemia, make sure you know how to use your glucagon injection kit.  Make sure you know how to treat hypoglycemia. Keep a carbohydrate snack available when you may be at risk for hypoglycemia. This information is not intended to replace advice given to you by your health care provider. Make sure you discuss any questions you have with your health care provider. Document Revised: 01/20/2019 Document Reviewed: 05/26/2017 Elsevier Patient Education  2021 [employer].

## 2020-12-22 ENCOUNTER — Encounter (INDEPENDENT_AMBULATORY_CARE_PROVIDER_SITE_OTHER): Payer: Self-pay | Admitting: Nurse Practitioner

## 2020-12-24 DIAGNOSIS — S63104D Unspecified dislocation of right thumb, subsequent encounter: Secondary | ICD-10-CM | POA: Diagnosis not present

## 2020-12-25 ENCOUNTER — Ambulatory Visit (INDEPENDENT_AMBULATORY_CARE_PROVIDER_SITE_OTHER): Payer: Medicare Other | Admitting: Nurse Practitioner

## 2020-12-25 ENCOUNTER — Ambulatory Visit: Payer: Medicare Other | Admitting: *Deleted

## 2020-12-25 ENCOUNTER — Other Ambulatory Visit: Payer: Self-pay

## 2020-12-25 VITALS — BP 115/70 | HR 71 | Ht 70.0 in | Wt 230.0 lb

## 2020-12-25 DIAGNOSIS — L97201 Non-pressure chronic ulcer of unspecified calf limited to breakdown of skin: Secondary | ICD-10-CM | POA: Diagnosis not present

## 2020-12-25 DIAGNOSIS — F439 Reaction to severe stress, unspecified: Secondary | ICD-10-CM

## 2020-12-25 DIAGNOSIS — F4321 Adjustment disorder with depressed mood: Secondary | ICD-10-CM

## 2020-12-25 DIAGNOSIS — E1151 Type 2 diabetes mellitus with diabetic peripheral angiopathy without gangrene: Secondary | ICD-10-CM

## 2020-12-25 NOTE — Progress Notes (Signed)
History of Present Illness  There is no documented history at this time  Assessments & Plan   There are no diagnoses linked to this encounter.    Additional instructions  Subjective:  Patient presents with venous ulcer of the Bilateral lower extremity.    Procedure:  3 layer unna wrap was placed Bilateral lower extremity.   Plan:   Follow up in one week.  

## 2020-12-25 NOTE — Chronic Care Management (AMB) (Signed)
Chronic Care Management    Clinical Social Work Note  12/25/2020 Name: Gary [name] MRN: [id number] DOB: September 26, 1946  Gary [name] is a 75 y.o. year old male who is a primary care patient of [name], MD. The CCM team was consulted to assist the patient with chronic disease management and/or care coordination needs related to: [place] and Resources for Adjustment Disorder, Grief, Loss and Depression.   Engaged with patient by telephone for follow up visit in response to provider referral for social work chronic care management and care coordination services.   Consent to Services:  The patient was given information about Chronic Care Management services, agreed to services, and gave verbal consent prior to initiation of services.  Please see initial visit note for detailed documentation.   Patient agreed to services and consent obtained.   Assessment: Review of patient past medical history, allergies, medications, and health status, including review of relevant consultants reports was performed today as part of a comprehensive evaluation and provision of chronic care management and care coordination services.     SDOH (Social Determinants of Health) assessments and interventions performed:    Advanced Directives Status: Not ready or willing to discuss.  CCM Care Plan  Allergies  Allergen Reactions  . Rofecoxib Nausea Only    Outpatient Encounter Medications as of 12/25/2020  Medication Sig Note  . carvedilol (COREG) 3.125 MG tablet Take 1 tablet by mouth 2 (two) times daily with a meal.   . Cholecalciferol (VITAMIN D3) 50 MCG (2000 UT) CAPS Take by mouth.   . furosemide (LASIX) 40 MG tablet Take 40 mg by mouth 2 (two) times daily. 2 tablets in the morning 1 tablet in the afternoon as needed   . gabapentin (NEURONTIN) 100 MG capsule Take 1 capsule (100 mg total) by mouth at bedtime. (Patient not taking: No sig reported) 12/18/2020: Reports not taking due to patient  stating upsetting his stomach and making him dizzy  . glucose 4 GM chewable tablet Chew 1 tablet by mouth once as needed for low blood sugar.   . insulin aspart (NOVOLOG) 100 UNIT/ML injection Basal rate 12am 0.8 units per hour, 9am 0.9 units per hour. (total basal insulin 20.7 units). Carbohydrate ratio 1 units for every 8gm of carbohydrate. Correction factor 1 units for every 50mg /dl over target cbg. Target CBG 80-120   . nystatin cream (MYCOSTATIN) Apply 1 application topically 2 (two) times daily.   . potassium chloride (KLOR-CON) 10 MEQ tablet TAKE 1 TABLET BY MOUTH TWICE DAILY FOR 3 DAYS THEN CONTINUE ONCE DAILY 12/13/2020: Taking once daily   . pravastatin (PRAVACHOL) 40 MG tablet Take 40 mg by mouth daily.    . vitamin B-12 (CYANOCOBALAMIN) 1000 MCG tablet Take 1,000 mcg by mouth daily.    Facility-Administered Encounter Medications as of 12/25/2020  Medication  . heparin lock flush 100 unit/mL  . sodium chloride flush (NS) 0.9 % injection 10 mL  . sodium chloride flush (NS) 0.9 % injection 10 mL    Patient Active Problem List   Diagnosis Date Noted  . Hypokalemia 10/24/2020  . Injury by nail 08/15/2020  . Shingles 07/24/2020  . Rash 07/02/2020  . Colon cancer screening 07/02/2020  . Sick sinus syndrome ([place]) 12/28/2019  . Acquired absence of kidney 09/13/2019  . Edema of lower extremity 09/13/2019  . 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 Proteinuria 09/13/2019  . Lower extremity pain, bilateral   .  Diabetic foot ulcer associated with type 1 diabetes mellitus ([place])   . Acute on chronic systolic CHF (congestive heart failure) ([place])   . Cellulitis 08/18/2019  . Lower limb ulcer, calf ([place]) 08/01/2019  . Left hip pain 07/16/2019  . Pain due to onychomycosis of toenails of both feet 04/03/2019  . Swelling of limb 02/07/2019  . Pancytopenia ([place]) 12/03/2018  . CKD (chronic kidney disease) stage 3,  GFR 30-59 ml/min (HCC) 09/16/2018  . Left shoulder pain 09/16/2018  . Chronic cholecystitis 12/29/2017  . Graves disease 12/29/2017  . Peripheral neuropathy 12/29/2017  . S/p nephrectomy 12/29/2017  . 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 Symptomatic bradycardia 12/02/2017  . AKI (acute kidney injury) ([place]) 12/02/2017  . Chronic systolic CHF (congestive heart failure) ([place]) 12/02/2017  . Saturday night paralysis 11/12/2017  . Hypoglycemia 10/28/2017  . Goals of care, counseling/discussion 10/24/2017  . Lymphedema 10/20/2017  . Iron deficiency anemia 09/11/2017  . Fall 08/13/2017  . Humerus fracture 08/13/2017  . Anemia 01/12/2017  . Bilateral carotid artery stenosis 06/29/2016  . Hand laceration 12/16/2015  . Adjustment disorder with depressed mood 08/11/2015  . Cardiomyopathy, idiopathic ([place]) 04/22/2015  . CAD in native artery 11/02/2014  . Mantle cell lymphoma ([place]) 08/03/2014  . History of colonic polyps 04/29/2014  . Irregular heart beat 04/22/2014  . SOB (shortness of breath) 04/22/2014  . Stress 03/21/2014  . PVC (premature ventricular contraction) 02/21/2014  . GERD (gastroesophageal reflux disease) 10/23/2013  . Gastroparesis 06/25/2013  . Chest pain 04/13/2013  . Thyroid disease 04/13/2013  . Diabetes ([place]) 04/13/2013  . Essential hypertension, benign 04/13/2013  . Hypercholesterolemia 04/13/2013    Conditions to be addressed/monitored:  Mental Health Counseling and Resources for Adjustment Disorder, Grief, Loss, Depression and Diabetes.  Limited Social Support, Transportation, Housing Barriers, Level of Care Concerns, Mental Health Concerns, Social Isolation and Memory Deficits.  Care Plan : LCSW Plan of Care  Updates made by Francis Gaines, LCSW since 12/25/2020 12:00 AM    Problem: Patient Requires Science writer, Obtaining Affordable Handicapped Accessible Senior Housing, and Reducing/Managing Symptoms of Grief and Depression.    Priority: High    Goal: Patient Requires Science writer, [place], and Reducing/Managing Symptoms of Grief and Depression.   Start Date: 11/27/2020  Expected End Date: 01/08/2021  This Visit's Progress: On track  Recent Progress: On track  Priority: High  Note:   Current barriers:   . Patient is in need of assistance with connecting to community resources for reliable transportation services and affordable handicapped accessible senior housing. . Acknowledges deficits with regards to available community agencies and resources. . Patient is unable to independently navigate community resource options without care coordination support through [place]. . Patient currently requires assistance reducing and managing symptoms of grief/loss and depression.  Clinical Goals: Over the next 45 days, patient will work with CSW and Care Guide to address needs related to transportation, affordable handicapped accessible senior housing, counseling and supportive services for grief/loss and depression.  Clinical Interventions:  . Collaboration with [name], MD regarding development and update of comprehensive plan of care as evidenced by provider attestation and co-signature. Bertram Savin care team collaboration (see longitudinal plan of care). . Assessment of needs and barriers that may be impacting patient's health and mental health. . Other interventions provided: Solution-Focused Strategies, Grief Counseling, Emotional/Supportive Counseling, Psychotropic Medication Adherence Assessment, Sleep Hygiene, Problem Solving, Consulted with MD regarding Level of Care Concerns, Consulted with Adult  Protective Services Worker - [name], with the [facility].  Patient Goals/Self-Care Activities:  . Continue to utilize Care Guide services for assistance with arranging transportation  to and from physician appointments. . Continue to receive transportation assistance through [facility] [phone]) for all [facility] affiliated physician appointments.   . Continue bi-weekly telephone sessions with LCSW to receive counseling and supportive services for symptoms of grief, loss and depression, until 01/08/2021.  Please notify LCSW if you change your mind about wanting to receive long-term counseling services. . Continue to work with [name], Adult Protective Services Case Worker with the [facility] to obtain resources/assistance obtaining affordable handicapped accessible senior living housing in [place], [place]. Marland Kitchen Continue to receive daily meal delivery services through [employer] with [employer] [facility] [place]. . Continue to review EMMI Educational Material, pertaining specifically to: "Dealing with Death", "Helping You Cope", "Tips for How to Help Your Mood", "Adjustment Disorder", and "Cognitive Behavioral Therapy".   . Review Aid and Attendance Benefits information mailed to you by CSW, including all of the following information: How to Apply for Aid and Attendance Benefits, Physician Statement, Statement in Support of Claim, Authorization to [employer], and Aid and Attendance Application.  Notify CSW if you change your mind about applying for services and need assistance with application completion and submission to [name].  Please notify LCSW if you change your mind about wanting to receive services or need assistance with application completion/submission.     Follow-Up Appointment:  01/08/2021 at 9:00am.            Follow-Up Appointment:  01/08/2021 at [place] LCSW Licensed Clinical Social Worker [place]  [phone]

## 2020-12-25 NOTE — Patient Instructions (Signed)
Visit Information  PATIENT GOALS: Goals Addressed              This Visit's Progress   .  Secure Transportation, [place] and Reduce/Manage Symptoms of Grief and Depression. (pt-stated)        Timeframe:  Short-Term Goal Priority:  High Start Date:  11/27/2020                        Expected End Date:  01/08/2021       Follow-Up Appointment:  01/08/2021 at 9:00am.            . Continue to utilize Care Guide services for assistance with arranging transportation to and from physician appointments. . Continue to receive transportation assistance through [facility] [phone]) for all [facility] affiliated physician appointments.   . Continue bi-weekly telephone sessions with LCSW to receive counseling and supportive services for symptoms of grief, loss and depression, until 01/08/2021.  Please notify LCSW if you change your mind about wanting to receive long-term counseling services. . Continue to work with [name], Adult Protective Services Case Worker with the [place] to obtain resources/assistance obtaining affordable handicapped accessible senior living housing in [place], [place]. Marland Kitchen Continue to receive daily meal delivery services through [employer] with [employer] [facility] [place]. . Continue to review EMMI Educational Material, pertaining specifically to: "Dealing with Death", "Helping You Cope", "Tips for How to Help Your Mood", "Adjustment Disorder", and "Cognitive Behavioral Therapy".   . Review Aid and Attendance Benefits information mailed to you by CSW, including all of the following information: How to Apply for Aid and Attendance Benefits, Physician Statement, Statement in Support of Claim, Authorization to [employer], and Aid and Attendance Application.  Notify CSW if you change your mind about applying for services and need  assistance with application completion and submission to [name].  Please notify LCSW if you change your mind about wanting to receive services or need assistance with application completion/submission.         Patient verbalizes understanding of instructions provided today and agrees to view in [place].   Follow-Up Appointment:  01/08/2021 at [place] LCSW Licensed Clinical Social Worker [place]  [phone]

## 2020-12-26 ENCOUNTER — Inpatient Hospital Stay: Payer: Medicare Other | Attending: Oncology

## 2020-12-26 DIAGNOSIS — C8310 Mantle cell lymphoma, unspecified site: Secondary | ICD-10-CM | POA: Insufficient documentation

## 2020-12-26 DIAGNOSIS — Z95828 Presence of other vascular implants and grafts: Secondary | ICD-10-CM

## 2020-12-26 DIAGNOSIS — Z452 Encounter for adjustment and management of vascular access device: Secondary | ICD-10-CM | POA: Insufficient documentation

## 2020-12-26 MED ORDER — SODIUM CHLORIDE 0.9% FLUSH
10.0000 mL | Freq: Once | INTRAVENOUS | Status: AC
  Administered 2020-12-26: 10 mL via INTRAVENOUS
  Filled 2020-12-26: qty 10

## 2020-12-26 MED ORDER — HEPARIN SOD (PORK) LOCK FLUSH 100 UNIT/ML IV SOLN
500.0000 [IU] | Freq: Once | INTRAVENOUS | Status: AC
  Administered 2020-12-26: 500 [IU] via INTRAVENOUS
  Filled 2020-12-26: qty 5

## 2020-12-26 MED ORDER — HEPARIN SOD (PORK) LOCK FLUSH 100 UNIT/ML IV SOLN
INTRAVENOUS | Status: AC
  Filled 2020-12-26: qty 5

## 2020-12-28 ENCOUNTER — Other Ambulatory Visit: Payer: Self-pay | Admitting: Internal Medicine

## 2020-12-30 ENCOUNTER — Encounter (INDEPENDENT_AMBULATORY_CARE_PROVIDER_SITE_OTHER): Payer: Self-pay | Admitting: Nurse Practitioner

## 2021-01-01 ENCOUNTER — Ambulatory Visit (INDEPENDENT_AMBULATORY_CARE_PROVIDER_SITE_OTHER): Payer: Medicare Other | Admitting: Nurse Practitioner

## 2021-01-03 ENCOUNTER — Ambulatory Visit: Payer: Medicare Other | Admitting: *Deleted

## 2021-01-03 DIAGNOSIS — I5022 Chronic systolic (congestive) heart failure: Secondary | ICD-10-CM

## 2021-01-03 DIAGNOSIS — E162 Hypoglycemia, unspecified: Secondary | ICD-10-CM

## 2021-01-03 DIAGNOSIS — E1151 Type 2 diabetes mellitus with diabetic peripheral angiopathy without gangrene: Secondary | ICD-10-CM

## 2021-01-03 NOTE — Patient Instructions (Signed)
Visit Information  PATIENT GOALS: Goals Addressed            This Visit's Progress   . (RNCM) Monitor and Manage My Blood Sugar-Diabetes Type 1   Not on track    Timeframe:  Long-Range Goal Priority:  High Start Date:   12/06/20                          Expected End Date:  06/10/21                     Follow Up Date 01/17/2021    . Check blood sugar at least 5 times a day . Check blood sugar if I feel it is too high or too low . Take the blood sugar meter and log to all doctor visits  . Consider drinking Boost or other supplement in the evenings if not eating a complete meal . Make sure to have candy or glucose tablets on you at all times; try drinking coke along with eating snack to treat hypoglycemia . Eat at least 3 meals a day with snack in between to help prevent hypoglycemia . Please call [employer] ( [phone]) as requested by Endocrinology to discuss insulin pump warranty . Please consider Home Health Nursing/Therapy involvement . Call Dr. Honor Junes or send message in [place] to notify of continued hypoglycemia for medication adjustment   Why is this important?    Checking your blood sugar at home helps to keep it from getting very high or very low.   Writing the results in a diary or log helps the doctor know how to care for you.   Your blood sugar log should have the time, the date and the results.   Also, write down the amount of insulin or other medicine you take.   Other information like what you ate, exercise done and how you were feeling will also be helpful..     Notes:     Marland Kitchen (RNCM) Track and Manage Fluids and Swelling-Heart Failure   Not on track    Timeframe:  Long-Range Goal Priority:  Medium Start Date:   12/06/20                          Expected End Date:  06/10/21                     Follow Up Date 01/17/21   . Call office if I gain more than 2 pounds in one day or 5 pounds in one week . Use salt in moderation, low salt heart healthy diabetic  diet . Weigh myself daily and write in log for provider review (at least start to weigh a few times a week working way up to daily) . Take medications as prescribed . Place walker in front of scale to help balance self while standing to weigh (please consider trying this to weigh yourself)   Why is this important?    It is important to check your weight daily and watch how much salt and liquids you have.   It will help you to manage your heart failure.    Notes:        Patient verbalizes understanding of instructions provided today and agrees to view in [place].   The care management team will reach out to the patient again over the next 20 business days.   [name] RN, MSN RN  Care Management Coordinator [place] [phone] [email or web address]

## 2021-01-03 NOTE — Chronic Care Management (AMB) (Signed)
Chronic Care Management   CCM RN Visit Note  01/03/2021 Name: Gary [name] MRN: [id number] DOB: 09-16-1946  Subjective: Gary [name] is a 75 y.o. year old male who is a primary care patient of [name], MD. The care management team was consulted for assistance with disease management and care coordination needs.    Engaged with patient by telephone for follow up visit in response to provider referral for case management and/or care coordination services.   Consent to Services:  The patient was given information about Chronic Care Management services, agreed to services, and gave verbal consent prior to initiation of services.  Please see initial visit note for detailed documentation.   Patient agreed to services and verbal consent obtained.   Assessment: Review of patient past medical history, allergies, medications, health status, including review of consultants reports, laboratory and other test data, was performed as part of comprehensive evaluation and provision of chronic care management services.   SDOH (Social Determinants of Health) assessments and interventions performed:    CCM Care Plan  Allergies  Allergen Reactions  . Rofecoxib Nausea Only    Outpatient Encounter Medications as of 01/03/2021  Medication Sig Note  . carvedilol (COREG) 3.125 MG tablet Take 1 tablet by mouth 2 (two) times daily with a meal.   . Cholecalciferol (VITAMIN D3) 50 MCG (2000 UT) CAPS Take by mouth.   . furosemide (LASIX) 40 MG tablet Take 40 mg by mouth 2 (two) times daily. 2 tablets in the morning 1 tablet in the afternoon as needed   . glucose 4 GM chewable tablet Chew 1 tablet by mouth once as needed for low blood sugar.   . insulin aspart (NOVOLOG) 100 UNIT/ML injection Basal rate 12am 0.8 units per hour, 9am 0.9 units per hour. (total basal insulin 20.7 units). Carbohydrate ratio 1 units for every 8gm of carbohydrate. Correction factor 1 units for every 50mg /dl over target cbg. Target  CBG 80-120   . pravastatin (PRAVACHOL) 40 MG tablet Take 40 mg by mouth daily.    . vitamin B-12 (CYANOCOBALAMIN) 1000 MCG tablet Take 1,000 mcg by mouth daily.   Marland Kitchen gabapentin (NEURONTIN) 100 MG capsule Take 1 capsule (100 mg total) by mouth at bedtime. (Patient not taking: No sig reported) 12/18/2020: Reports not taking due to patient stating upsetting his stomach and making him dizzy  . nystatin cream (MYCOSTATIN) Apply 1 application topically 2 (two) times daily.   . potassium chloride (KLOR-CON) 10 MEQ tablet TAKE 1 TABLET BY MOUTH TWICE DAILY FOR 3 DAYS THEN CONTINUE ONCE DAILY    Facility-Administered Encounter Medications as of 01/03/2021  Medication  . heparin lock flush 100 unit/mL  . sodium chloride flush (NS) 0.9 % injection 10 mL  . sodium chloride flush (NS) 0.9 % injection 10 mL    Patient Active Problem List   Diagnosis Date Noted  . Hypokalemia 10/24/2020  . Injury by nail 08/15/2020  . Shingles 07/24/2020  . Rash 07/02/2020  . Colon cancer screening 07/02/2020  . Sick sinus syndrome ([place]) 12/28/2019  . Acquired absence of kidney 09/13/2019  . Edema of lower extremity 09/13/2019  . 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 Proteinuria 09/13/2019  . Lower extremity pain, bilateral   . Diabetic foot ulcer associated with type 1 diabetes mellitus ([place])   . Acute on chronic systolic CHF (congestive heart failure) ([place])   . Cellulitis 08/18/2019  . Lower limb ulcer,  calf ([place]) 08/01/2019  . Left hip pain 07/16/2019  . Pain due to onychomycosis of toenails of both feet 04/03/2019  . Swelling of limb 02/07/2019  . Pancytopenia ([place]) 12/03/2018  . CKD (chronic kidney disease) stage 3, GFR 30-59 ml/min (HCC) 09/16/2018  . Left shoulder pain 09/16/2018  . Chronic cholecystitis 12/29/2017  . Graves disease 12/29/2017  . Peripheral neuropathy 12/29/2017  . S/p nephrectomy 12/29/2017  . 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 Symptomatic bradycardia 12/02/2017  . AKI (acute kidney injury) ([place]) 12/02/2017  . Chronic systolic CHF (congestive heart failure) ([place]) 12/02/2017  . Saturday night paralysis 11/12/2017  . Hypoglycemia 10/28/2017  . Goals of care, counseling/discussion 10/24/2017  . Lymphedema 10/20/2017  . Iron deficiency anemia 09/11/2017  . Fall 08/13/2017  . Humerus fracture 08/13/2017  . Anemia 01/12/2017  . Bilateral carotid artery stenosis 06/29/2016  . Hand laceration 12/16/2015  . Adjustment disorder with depressed mood 08/11/2015  . Cardiomyopathy, idiopathic ([place]) 04/22/2015  . CAD in native artery 11/02/2014  . Mantle cell lymphoma ([place]) 08/03/2014  . History of colonic polyps 04/29/2014  . Irregular heart beat 04/22/2014  . SOB (shortness of breath) 04/22/2014  . Stress 03/21/2014  . PVC (premature ventricular contraction) 02/21/2014  . GERD (gastroesophageal reflux disease) 10/23/2013  . Gastroparesis 06/25/2013  . Chest pain 04/13/2013  . Thyroid disease 04/13/2013  . Diabetes ([place]) 04/13/2013  . Essential hypertension, benign 04/13/2013  . Hypercholesterolemia 04/13/2013    Conditions to be addressed/monitored:CHF and DMII  Care Plan : Heart Failure (Adult)  Updates made by Leona Singleton, RN since 01/03/2021 12:00 AM  Problem: Disease Progression (Heart Failure)   Priority: Medium  Goal: Patient will report no heart failure exacerbations in the next 90 days   Start Date: 12/06/2020  Expected End Date: 03/06/2021  This Visit's Progress: Not on track  Recent Progress: Not on track  Priority: Medium  Current Barriers:  Marland Kitchen Knowledge deficit related to basic heart failure pathophysiology and self care management as evidenced by continued lower extremity edema requiring Unna Boots.  Patient continues to report he does not weigh himself at home.  States he weighs weekly at providers office when he gets his unna boots changed.  Discussed importance of  daily weight monitoring and encouraged patient to use home walker to help balance on his home scale.  Reports lower extremity edema is about the same and denies any increase in shortness of breath at this time. . Patient does not have transportation to provider appointments . Transportation Barriers; going to use [facility] transportation for appointments within [place] system and using [employer] for other medical appointments . Lacks social connections . Unable to perform IADLs independently Case Manager Clinical Goal(s):  . patient will verbalize understanding of Heart Failure Action Plan and when to call doctor . patient will take all Heart Failure mediations as prescribed . patient will weigh daily and record (notifying MD of 3 lb weight gain over night or 5 lb in a week) Interventions:  . Collaboration with [name], MD regarding development and update of comprehensive plan of care as evidenced by provider attestation and co-signature . Inter-disciplinary care team collaboration (see longitudinal plan of care) . Provided verbal education on low sodium diet . Advised patient to weigh each morning after emptying bladder . Discussed importance of daily weight and advised patient to weigh and record daily and encouraged patient to do so; Reinforced need to weight daily and encouraged patient to use walker in front  of scale to help balance while standing on scale . Reviewed signs and symptoms of heart failure exacerbation . Encouraged elevation of lower extremities as able . Encouraged continued use of [employer] and [employer] transportation . Discussed medications and encouraged medication compliance . Sending EMMI Heart Failure Video . Discussed importance of weighing at home and not just at providers office Patient Goals/Self-Care Activities . Call office if I gain more than 2 pounds in one day or 5 pounds in one week . Use salt in moderation, low salt heart healthy diabetic  diet . Weigh myself daily and write in log for provider review (at least start to weigh a few times a week working way up to daily) . Take medications as prescribed . Place walker in front of scale to help balance self while standing to weigh (please consider trying this to weigh yourself) Follow Up Plan: The care management team will reach out to the patient again over the next 20 business days.    Care Plan : Diabetes  Updates made by Leona Singleton, RN since 01/03/2021 12:00 AM  Problem: Hypoglycemia causing syncopal episodes   Priority: High  Long-Range Goal: Patient will report less episodes of hypoglycemia within the next 30 days   Start Date: 12/06/2020  Expected End Date: 06/10/2021  This Visit's Progress: Not on track  Recent Progress: Not on track  Priority: High  Objective:  Lab Results  Component Value Date   HGBA1C 6.8 (H) 06/24/2020 .   Lab Results  Component Value Date   CREATININE 1.40 10/24/2020   CREATININE 1.78 (H) 08/06/2020   CREATININE 2.01 (H) 07/29/2020   Current Barriers:  Marland Kitchen Knowledge Deficits related to basic Diabetes pathophysiology and self care/management as evidenced by multiple syncopal episodes related to hypoglycemia.  Continues to report hypoglycemia episodes with lowest reading of 48 and at times patient reporting dog has to shake him awake.  Reports he has not had to disable his insulin pump.  Still has not been able to contact [employer] concerning warrenty.  Denies any falls in the past 2 weeks.  Reports using glucose tablets to bring blood sugar up during hypoglycemic episodes.  Continues to decline home health services, stating he will think about it. . Limited Social Support . Trouble getting insulin pump supplies-reports he now has insulin pump sensors Case Manager Clinical Goal(s):  . patient will demonstrate improved adherence to prescribed treatment plan for diabetes self care/management as evidenced by: at least 4 times a day monitoring and  recording of CBG, adherence to ADA/ carb modified diet, adherence to prescribed medication regimen, contacting provider for new or worsened symptoms or questions Interventions:  . Collaboration with [name], MD regarding development and update of comprehensive plan of care as evidenced by provider attestation and co-signature . Inter-disciplinary care team collaboration (see longitudinal plan of care) . Provided education to patient about basic DM disease process . Discussed medications and encouraged medication compliance . Discussed plans with patient for ongoing care management follow up and provided patient with direct contact information for care management team . Encouraged continued participation with CCM Social Worker and CCM Pharmacist and to consider starting VA benefits with aid and attendance . Reviewed glucose ranges and hypoglycemic events; and discussed dangers of hypoglycemia and treatment options, need for immediate sugar replacement with juice, sugar, soda in addition to snack or candy bar or glucose tablets . Instructed and encouraged patient to contact Endocrinologist to notify of continued hypoglycemia for possible medication/insulin  adjustments; offered to contact Endocrinologist with/for patient (patient declines offer, stating he will contact himself) . Reviewed scheduled/upcoming provider appointments including: Endocrinology appointment on 4/14, Vascular appointment on 3/30, and confirmed patient has transportation arranged and knows whom can transport to appointments  . Discussed healthy meal and snack options . Rediscussed possible Home Health Nursing or Therapy referral (patient still declines at this time, but states he will continue to think about it) . Verified patient has still not spoken with  Insulin Pump Manufacture [employer] to verify warranty per endocrinology instructions; provided patient with number, and again offered to assist patient in making this call  today(continues to decline assistance, stated he would call himself) Patient Goals/Self-Care Activities . Check blood sugar at least 5 times a day . Check blood sugar if I feel it is too high or too low . Take the blood sugar meter and log to all doctor visits  . Consider drinking Boost or other supplement in the evenings if not eating a complete meal . Make sure to have candy or glucose tablets on you at all times; try drinking coke along with eating snack to treat hypoglycemia . Eat at least 3 meals a day with snack in between to help prevent hypoglycemia . Please call [employer] ( [phone]) as requested by Endocrinology to discuss insulin pump warranty . Please consider Home Health Nursing/Therapy involvement . Call Dr. Honor Junes or send message in [place] to notify of continued hypoglycemia for medication adjustment Follow Up Plan: The care management team will reach out to the patient again over the next 20 business days.      Plan:The care management team will reach out to the patient again over the next 20 business days.  [name] RN, MSN RN Care Management Coordinator [place] [phone] [email or web address]

## 2021-01-06 ENCOUNTER — Other Ambulatory Visit: Payer: Self-pay

## 2021-01-06 ENCOUNTER — Ambulatory Visit (INDEPENDENT_AMBULATORY_CARE_PROVIDER_SITE_OTHER): Payer: Medicare Other | Admitting: Podiatry

## 2021-01-06 ENCOUNTER — Encounter: Payer: Self-pay | Admitting: Podiatry

## 2021-01-06 DIAGNOSIS — B351 Tinea unguium: Secondary | ICD-10-CM | POA: Diagnosis not present

## 2021-01-06 DIAGNOSIS — N179 Acute kidney failure, unspecified: Secondary | ICD-10-CM

## 2021-01-06 DIAGNOSIS — E1151 Type 2 diabetes mellitus with diabetic peripheral angiopathy without gangrene: Secondary | ICD-10-CM

## 2021-01-06 DIAGNOSIS — M79675 Pain in left toe(s): Secondary | ICD-10-CM | POA: Diagnosis not present

## 2021-01-06 DIAGNOSIS — M79674 Pain in right toe(s): Secondary | ICD-10-CM | POA: Diagnosis not present

## 2021-01-06 NOTE — Progress Notes (Signed)
This patient returns to my office for at risk foot care.  This patient requires this care by a professional since this patient will be at risk due to having chronic kidney disease and diabetes.  This patient is unable to cut nails himself since the patient cannot reach his nails.These nails are painful walking and wearing shoes. This patient is wearing unna boots on both legs/feet. This patient presents for at risk foot care today.  General Appearance  Alert, conversant and in no acute stress.  Vascular  Deferred due to unna boots.  Neurologic  Deferred due to unna boots.  Nails Thick disfigured discolored nails with subungual debris  from hallux to fifth toes bilaterally. No evidence of bacterial infection or drainage bilaterally.   Orthopedic  No limitations of motion  feet .  No crepitus or effusions noted.  No bony pathology or digital deformities noted.  Skin  normotropic skin with no porokeratosis noted bilaterally.  No signs of infections or ulcers noted.     Onychomycosis  Pain in right toes  Pain in left toes  Consent was obtained for treatment procedures.   Mechanical debridement of nails 1-5  bilaterally performed with a nail nipper.  Filed with dremel without incident.    Return office visit    3 months                 Told patient to return for periodic foot care and evaluation due to potential at risk complications.   [name] DPM

## 2021-01-08 ENCOUNTER — Other Ambulatory Visit: Payer: Self-pay

## 2021-01-08 ENCOUNTER — Telehealth: Payer: Self-pay | Admitting: Internal Medicine

## 2021-01-08 ENCOUNTER — Ambulatory Visit (INDEPENDENT_AMBULATORY_CARE_PROVIDER_SITE_OTHER): Payer: Medicare Other | Admitting: Nurse Practitioner

## 2021-01-08 ENCOUNTER — Encounter (INDEPENDENT_AMBULATORY_CARE_PROVIDER_SITE_OTHER): Payer: Self-pay | Admitting: Nurse Practitioner

## 2021-01-08 ENCOUNTER — Ambulatory Visit: Payer: Medicare Other | Admitting: *Deleted

## 2021-01-08 VITALS — BP 162/91 | HR 91 | Resp 16 | Wt 234.6 lb

## 2021-01-08 DIAGNOSIS — I890 Lymphedema, not elsewhere classified: Secondary | ICD-10-CM

## 2021-01-08 DIAGNOSIS — F4321 Adjustment disorder with depressed mood: Secondary | ICD-10-CM | POA: Diagnosis not present

## 2021-01-08 DIAGNOSIS — E1151 Type 2 diabetes mellitus with diabetic peripheral angiopathy without gangrene: Secondary | ICD-10-CM

## 2021-01-08 DIAGNOSIS — E7800 Pure hypercholesterolemia, unspecified: Secondary | ICD-10-CM | POA: Diagnosis not present

## 2021-01-08 DIAGNOSIS — I10 Essential (primary) hypertension: Secondary | ICD-10-CM

## 2021-01-08 DIAGNOSIS — L97201 Non-pressure chronic ulcer of unspecified calf limited to breakdown of skin: Secondary | ICD-10-CM | POA: Diagnosis not present

## 2021-01-08 DIAGNOSIS — I5022 Chronic systolic (congestive) heart failure: Secondary | ICD-10-CM | POA: Diagnosis not present

## 2021-01-08 DIAGNOSIS — F439 Reaction to severe stress, unspecified: Secondary | ICD-10-CM

## 2021-01-08 MED ORDER — NYSTATIN 100000 UNIT/GM EX CREA: 1.0000 "application " | TOPICAL_CREAM | Freq: Two times a day (BID) | CUTANEOUS | 0 refills | Status: DC

## 2021-01-08 NOTE — Telephone Encounter (Signed)
Received note from [place].  States he is consistently having low sugars at bedtime.  Need to confirm he is eating regular meals.  Also, need to know how much insulin he is giving himself in the evening.  We may need to adjust.  He sees Dr Honor Junes for his sugars. I also want to make him aware of this issue.

## 2021-01-08 NOTE — Telephone Encounter (Signed)
Sugars running below 100 at night. He has been giving 5-11 units depending on what he eats. Has been eating regular meals. Gets meals on wheels. Has appt with Dr Honor Junes next week. This is just an FYI for you

## 2021-01-08 NOTE — Telephone Encounter (Signed)
I received a note from Catie that Gary [name] wanted his cream refilled.  I assume he is talking about nystatin cream.  Please confirm with him if this is the cream he needs.  (if he does not know the name of the cream, then see what he needs it for).  Thanks.

## 2021-01-08 NOTE — Telephone Encounter (Signed)
Called Mr Kellogg. Discussed.  He states he is taking 4-6 units with evening meal.  Discussed that he needs to make sure he eats a good evening meal and a snack before bed.  He is also going to decrease his insulin (evening dose). Will let us know if sugars remain low.  Also, will keep f/u with Dr Honor Junes.

## 2021-01-08 NOTE — Telephone Encounter (Signed)
-----   Message from Francis Gaines, [place] sent at 01/08/2021 10:13 AM EDT ----- Good morning Dr. Nicki Reaper,  Patient reported that his blood sugars have been consistently dropping below 100 at bedtime, forcing him to get out of bed, eat something, then stay awake for an additional hour of so until blood sugars are regulated.  Piece of information I thought I should pass along.  Otherwise, he appears to be doing well, a lot more jovial, and actually indicated that he would not be opposed to dating again.  I am trying to encourage him to attend senior resource activities within his community, but no luck thus far.  Thanks, and take care.  Di Kindle

## 2021-01-08 NOTE — Patient Instructions (Signed)
Visit Information  PATIENT GOALS: Goals Addressed              This Visit's Progress   .  COMPLETED: Network engineer, [place] and Reduce/Manage Symptoms of Grief and Depression. (pt-stated)   On track     Timeframe:  Short-Term Goal Priority:  High Start Date:  11/27/2020                        Expected End Date:  01/08/2021       Patient Goals: . Continue to utilize Care Guide services for assistance with arranging transportation to and from physician appointments. . Continue to receive transportation assistance through [facility] [phone]) for all [facility] affiliated physician appointments.   . Continue to work with [name], Adult Protective Services Case Worker with the [facility] to obtain resources/assistance obtaining affordable handicapped accessible senior living housing in [place], [place]. Marland Kitchen Continue to receive daily meal delivery services through [employer] with [employer] [facility] [place]. . Continue to review EMMI Educational Material, pertaining specifically to: "Dealing with Death", "Helping You Cope", "Tips for How to Help Your Mood", "Adjustment Disorder", and "Cognitive Behavioral Therapy".   . Consider self-enrollment, or with the assistance of [name], Adult Protective Services Case Worker with the [place], in [place] (Program of [place] for the Elderly), [employer] ([place] for [employer]) and/or [employer], to obtain socialization, community support, education, exercise, hot meals and senior activities. . Continue to address questions and/or concerns directly with providers and pharmacist.  Follow-Up Appointment:  No follow-up required at this time, unless additional social work needs are identified.          Patient verbalizes understanding of  instructions provided today and agrees to view in [place].   Follow-Up Appointment:  No follow-up required at this time, unless additional social work needs are identified.  Nat Christen LCSW Licensed Clinical Social Worker [place]  [phone]

## 2021-01-08 NOTE — Telephone Encounter (Signed)
-----   Message from De Hollingshead, [place] sent at 01/08/2021  9:58 AM EDT ----- Patient asked Di Kindle if we could refill the "rash cream" script - I assume nystatin. Thanks! ----- Message ----- From: Marylene Buerger Sent: 01/08/2021   9:41 AM EDT To: De Hollingshead, RPH-[name]  Hey Catie, Mr. Keeven would like to know if you can refill his prescription for "rash cream".  Thanks, Di Kindle

## 2021-01-08 NOTE — Chronic Care Management (AMB) (Signed)
Chronic Care Management    Clinical Social Work Note  01/08/2021 Name: Gary [name] MRN: [id number] DOB: 1946/05/03  Gary [name] is a 75 y.o. year old male who is a primary care patient of Gary Pheasant, MD. The CCM team was consulted to assist the patient with chronic disease management and/or care coordination needs related to: Secure Transportation, [place] and Reduce/Manage Symptoms of Grief and Depression.  Engaged with patient by telephone for follow up visit in response to provider referral for social work chronic care management and care coordination services.   Consent to Services:  The patient was given information about Chronic Care Management services, agreed to services, and gave verbal consent prior to initiation of services.  Please see initial visit note for detailed documentation.   Patient agreed to services and consent obtained.   Assessment: Review of patient past medical history, allergies, medications, and health status, including review of relevant consultants reports was performed today as part of a comprehensive evaluation and provision of chronic care management and care coordination services.     SDOH (Social Determinants of Health) assessments and interventions performed:    Advanced Directives Status: Not addressed in this encounter.  CCM Care Plan  Allergies  Allergen Reactions  . Rofecoxib Nausea Only    Outpatient Encounter Medications as of 01/08/2021  Medication Sig Note  . carvedilol (COREG) 3.125 MG tablet Take 1 tablet by mouth 2 (two) times daily with a meal.   . Cholecalciferol (VITAMIN D3) 50 MCG (2000 UT) CAPS Take by mouth.   . furosemide (LASIX) 40 MG tablet Take 40 mg by mouth 2 (two) times daily. 2 tablets in the morning 1 tablet in the afternoon as needed   . gabapentin (NEURONTIN) 100 MG capsule Take 1 capsule (100 mg total) by mouth at bedtime. 12/18/2020: Reports not taking due to patient  stating upsetting his stomach and making him dizzy  . glucose 4 GM chewable tablet Chew 1 tablet by mouth once as needed for low blood sugar.   . insulin aspart (NOVOLOG) 100 UNIT/ML injection Basal rate 12am 0.8 units per hour, 9am 0.9 units per hour. (total basal insulin 20.7 units). Carbohydrate ratio 1 units for every 8gm of carbohydrate. Correction factor 1 units for every 50mg /dl over target cbg. Target CBG 80-120   . nystatin cream (MYCOSTATIN) Apply 1 application topically 2 (two) times daily.   . potassium chloride (KLOR-CON) 10 MEQ tablet TAKE 1 TABLET BY MOUTH TWICE DAILY FOR 3 DAYS THEN CONTINUE ONCE DAILY   . pravastatin (PRAVACHOL) 40 MG tablet Take 40 mg by mouth daily.    . vitamin B-12 (CYANOCOBALAMIN) 1000 MCG tablet Take 1,000 mcg by mouth daily.    Facility-Administered Encounter Medications as of 01/08/2021  Medication  . heparin lock flush 100 unit/mL  . sodium chloride flush (NS) 0.9 % injection 10 mL  . sodium chloride flush (NS) 0.9 % injection 10 mL    Patient Active Problem List   Diagnosis Date Noted  . Hypokalemia 10/24/2020  . Injury by nail 08/15/2020  . Shingles 07/24/2020  . Rash 07/02/2020  . Colon cancer screening 07/02/2020  . Sick sinus syndrome ([place]) 12/28/2019  . Acquired absence of kidney 09/13/2019  . Edema of lower extremity 09/13/2019  . 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 Proteinuria 09/13/2019  . Lower extremity pain, bilateral   . Diabetic foot ulcer associated with type  1 diabetes mellitus ([place])   . Acute on chronic systolic CHF (congestive heart failure) ([place])   . Cellulitis 08/18/2019  . Lower limb ulcer, calf ([place]) 08/01/2019  . Left hip pain 07/16/2019  . Pain due to onychomycosis of toenails of both feet 04/03/2019  . Swelling of limb 02/07/2019  . Pancytopenia ([place]) 12/03/2018  . CKD (chronic kidney disease) stage 3, GFR 30-59 ml/min (HCC)  09/16/2018  . Left shoulder pain 09/16/2018  . Chronic cholecystitis 12/29/2017  . Graves disease 12/29/2017  . Peripheral neuropathy 12/29/2017  . S/p nephrectomy 12/29/2017  . 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 Symptomatic bradycardia 12/02/2017  . AKI (acute kidney injury) ([place]) 12/02/2017  . Chronic systolic CHF (congestive heart failure) ([place]) 12/02/2017  . Saturday night paralysis 11/12/2017  . Hypoglycemia 10/28/2017  . Goals of care, counseling/discussion 10/24/2017  . Lymphedema 10/20/2017  . Iron deficiency anemia 09/11/2017  . Fall 08/13/2017  . Humerus fracture 08/13/2017  . Anemia 01/12/2017  . Bilateral carotid artery stenosis 06/29/2016  . Hand laceration 12/16/2015  . Adjustment disorder with depressed mood 08/11/2015  . Cardiomyopathy, idiopathic ([place]) 04/22/2015  . CAD in native artery 11/02/2014  . Mantle cell lymphoma ([place]) 08/03/2014  . History of colonic polyps 04/29/2014  . Irregular heart beat 04/22/2014  . SOB (shortness of breath) 04/22/2014  . Stress 03/21/2014  . PVC (premature ventricular contraction) 02/21/2014  . GERD (gastroesophageal reflux disease) 10/23/2013  . Gastroparesis 06/25/2013  . Chest pain 04/13/2013  . Thyroid disease 04/13/2013  . Diabetes ([place]) 04/13/2013  . Essential hypertension, benign 04/13/2013  . Hypercholesterolemia 04/13/2013    Conditions to be addressed/monitored: Transportation, [place], Grief, Loss and Depression. Limited Social Support, Transportation, Housing Barriers, Level of Care Concerns, Mental Health Concerns, Social Isolation and Limited Access to Caregiver.  Care Plan : LCSW Plan of Care  Updates made by Francis Gaines, LCSW since 01/08/2021 12:00 AM    Problem: Patient Requires Science writer, Obtaining Affordable Handicapped Accessible Senior Housing, and Reducing/Managing Symptoms of Grief and Depression. Resolved 01/08/2021   Priority: High    Goal: Patient Requires Science writer, [place], and Reducing/Managing Symptoms of Grief and Depression. Completed 01/08/2021  Start Date: 11/27/2020  Expected End Date: 01/08/2021  This Visit's Progress: On track  Recent Progress: On track  Priority: High  Note:   Current barriers:   . Patient is in need of assistance with connecting to community resources for reliable transportation services and affordable handicapped accessible senior housing. . Acknowledges deficits with regards to available community agencies and resources. . Patient is unable to independently navigate community resource options without care coordination support through [facility]. . Patient currently requires assistance reducing and managing symptoms of grief/loss and depression. Clinical Goals:  . Over the next 45 days, patient will work with CSW and Care Guide to address needs related to transportation, affordable handicapped accessible senior housing, counseling and supportive services for grief/loss and depression. Clinical Interventions:  . Collaboration with Dr. Einar [name], Primary Care Physician, regarding development and update of comprehensive plan of care as evidenced by provider attestation and co-signature. . Collaboration with Dr. Einar [name], Primary Care Physician, regarding patient's self report of blood sugars dropping below 100's at bedtime. Bertram Savin care team collaboration (see longitudinal plan of care). . Collaboration with [name], Pharmacist, regarding patient's request to refill prescription for "rash cream". . Assessment of needs and barriers that may be impacting patient's health  and mental health. . Other interventions provided: Solution-Focused Strategies, Grief Counseling, Emotional/Supportive Counseling, Psychotropic Medication Adherence Assessment, Sleep Hygiene, Problem  Solving, Consulted with MD regarding Level of Care Concerns, Consulted with Adult Materials engineer - [name], with the [employer]. Patient Goals/Self-Care Activities:  . Continue to utilize Care Guide services for assistance with arranging transportation to and from physician appointments. . Continue to receive transportation assistance through [facility] [phone]) for all [facility] affiliated physician appointments.   . Continue to work with [name], Adult Protective Services Case Worker with the [place] to obtain resources/assistance obtaining affordable handicapped accessible senior living housing in [place], [place]. Marland Kitchen Continue to receive daily meal delivery services through [employer] Whe[employer] with [employer] [facility] [place]. . Continue to review EMMI Educational Material, pertaining specifically to: "Dealing with Death", "Helping You Cope", "Tips for How to Help Your Mood", "Adjustment Disorder", and "Cognitive Behavioral Therapy".   . Consider self-enrollment, or with the assistance of [name], Adult Protective Services Case Worker with the [place], in [place] (Program of [place] for the Elderly), [employer] ([place] [facility] [employer]) and/or [employer], to obtain socialization, community support, education, exercise, hot meals and senior activities. . Continue to address questions and/or concerns directly with providers and pharmacist.  Follow-Up Appointment:  No follow-up required at this time, unless additional social work needs are identified.        Follow Up Plan: No follow-up required at this time, unless additional social work needs are identified.      Nat Christen LCSW Licensed Clinical Social Worker [place]  [phone]

## 2021-01-08 NOTE — Progress Notes (Signed)
See phone note regarding refilling cream.

## 2021-01-13 ENCOUNTER — Encounter (INDEPENDENT_AMBULATORY_CARE_PROVIDER_SITE_OTHER): Payer: Self-pay | Admitting: Nurse Practitioner

## 2021-01-13 NOTE — Progress Notes (Signed)
Subjective:    [name], male    DOB: July 25, 1946, 75 y.o.   MRN: [id number] Chief Complaint  Patient presents with  . Follow-up    Unna wrap follow up    Gary [name] is a 75 year old male that presents today for evaluation of lower extremity edema following Unna wraps.  The patient is doing well thus far.  The swelling is well controlled and there are no new ulcerations.  The patient however has been these current wraps for approximately 2 weeks.  Unfortunately there were issues with his transportation and he was unable to make his last appointment.  He denies any more recent syncopal events. He denies any fever or chills   Review of Systems  Cardiovascular: Positive for leg swelling.  Skin: Positive for color change and wound.  All other systems reviewed and are negative.      Objective:   Physical Exam Vitals reviewed.  HENT:     Head: Normocephalic.  Cardiovascular:     Rate and Rhythm: Normal rate.     Pulses: Normal pulses.  Pulmonary:     Effort: Pulmonary effort is normal.  Musculoskeletal:     Right lower leg: Edema present.     Left lower leg: Edema present.  Neurological:     Mental Status: He is alert and oriented to person, place, and time.  Psychiatric:        Mood and Affect: Mood normal.        Behavior: Behavior normal.        Thought Content: Thought content normal.        Judgment: Judgment normal.     BP (!) 162/91 (BP Location: Right Arm)   Pulse 91   Resp 16   Wt 234 lb 9.6 oz (106.4 kg)   BMI 33.66 kg/m   Past Medical History:  Diagnosis Date  . CHF (congestive heart failure) ([place])   . Depression   . 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 Gastroparesis   . Graves disease   . Hypercholesterolemia   . Hypertension   . Mantle cell lymphoma ([place]) 07/2014  . Peripheral neuropathy   . Shingles     Social History   Socioeconomic History  . Marital status: Widowed     Spouse name: Not on file  . Number of children: 1  . Years of education: 60  . Highest education level: 12th grade  Occupational History  . Occupation: Retired  Tobacco Use  . Smoking status: Former Research scientist (life sciences)  . Smokeless tobacco: Current User    Types: Chew  Vaping Use  . Vaping Use: Never used  Substance and Sexual Activity  . Alcohol use: Yes    Alcohol/week: 3.0 standard drinks    Types: 3 Cans of beer per week    Comment: nightly  . Drug use: Never  . Sexual activity: Not Currently  Other Topics Concern  . Not on file  Social History Narrative  . Not on file   Social Determinants of Health   Financial Resource Strain: Low Risk   . Difficulty of Paying Living Expenses: Not hard at all  Food Insecurity: No Food Insecurity  . Worried About Charity fundraiser in the Last Year: Never true  . Ran Out of Food in the Last Year: Never true  Transportation Needs: Unmet Transportation Needs  . Lack of Transportation (Medical): Yes  . Lack of Transportation (Non-Medical): No  Physical Activity: Inactive  . Days of Exercise per Week: 0 days  . Minutes of Exercise per Session: 0 min  Stress: Stress Concern Present  . Feeling of Stress : To some extent  Social Connections: Socially Isolated  . Frequency of Communication with Friends and Family: More than three times a week  . Frequency of Social Gatherings with Friends and Family: Twice a week  . Attends Religious Services: Never  . Active Member of Clubs or Organizations: No  . Attends Archivist Meetings: Never  . Marital Status: Widowed  Intimate Partner Violence: Not At Risk  . Fear of Current or Ex-Partner: No  . Emotionally Abused: No  . Physically Abused: No  . Sexually Abused: No    Past Surgical History:  Procedure Laterality Date  . CHOLECYSTECTOMY    . NEPHRECTOMY     right  . PACEMAKER INSERTION N/A 12/07/2017   Procedure: INSERTION PACEMAKER DUAL CHAMBER INITIAL INSERT;  Surgeon: Isaias Cowman, MD;  Location: [facility] ORS;  Service: Cardiovascular;  Laterality: N/A;  . PORTA CATH INSERTION N/A 11/03/2017   Procedure: PORTA CATH INSERTION;  Surgeon: Katha Cabal, MD;  Location: [facility];  Service: Cardiovascular;  Laterality: N/A;    Family History  Problem Relation Age of Onset  . Breast cancer Mother   . Diabetes Father   . Cancer Father        Lymphoma  . Alcohol abuse Maternal Uncle   . Diabetes Sister   . Heart disease Other        grandfather    Allergies  Allergen Reactions  . Rofecoxib Nausea Only    CBC Latest Ref Rng & Units 07/29/2020 06/24/2020 01/24/2020  WBC 4.0 - 10.5 K/uL 6.1 6.3 5.7  Hemoglobin 13.0 - 17.0 g/dL 13.9 13.1 13.5  Hematocrit 39.0 - 52.0 % 40.0 39.0 40.8  Platelets 150 - 400 K/uL 102(L) 131.0(L) 118(L)      CMP     Component Value Date/Time   NA 143 10/24/2020 1455   NA 138 07/15/2014 0515   K 3.6 10/24/2020 1455   K 3.5 01/16/2015 1429   CL 102 10/24/2020 1455   CL 110 (H) 07/15/2014 0515   CO2 31 10/24/2020 1455   CO2 24 07/15/2014 0515   GLUCOSE 117 (H) 10/24/2020 1455   GLUCOSE 145 (H) 07/15/2014 0515   BUN 13 10/24/2020 1455   BUN 17 07/15/2014 0515   CREATININE 1.40 10/24/2020 1455   CREATININE 0.89 01/16/2015 1429   CALCIUM 7.9 (L) 10/24/2020 1455   CALCIUM 7.1 (L) 07/15/2014 0515   PROT 7.1 07/29/2020 0952   PROT 6.2 (L) 10/22/2014 1000   ALBUMIN 3.4 (L) 07/29/2020 0952   ALBUMIN 2.6 (L) 10/22/2014 1000   AST 24 07/29/2020 0952   AST 19 10/22/2014 1000   ALT 16 07/29/2020 0952   ALT 21 10/22/2014 1000   ALKPHOS 115 07/29/2020 0952   ALKPHOS 118 (H) 10/22/2014 1000   BILITOT 1.1 07/29/2020 0952   BILITOT 0.8 10/22/2014 1000   GFRNONAA 32 (L) 07/29/2020 0952   GFRNONAA >60 01/16/2015 1429   GFRAA 41 (L) 01/24/2020 0925   GFRAA >60 01/16/2015 1429     No results found.     Assessment & Plan:   1. Lymphedema Despite having his Unna wraps in place for an extended time, the the  patient's legs are doing well.  The swelling is well controlled and no significant new ulceration seen.  The patient did have  a significant amount of dried skin over his lower extremities.  The majority of this was debrided in office.  He tolerated this well.  The patient will continue in [place] wraps to be changed on a weekly basis.  We will have the patient return to the office in 4 weeks for evaluation.  2. Skin ulcer of calf, limited to breakdown of skin, unspecified laterality ([place]) See above  3. Essential hypertension, benign Continue antihypertensive medications as already ordered, these medications have been reviewed and there are no changes at this time.    Current Outpatient Medications on File Prior to Visit  Medication Sig Dispense Refill  . carvedilol (COREG) 3.125 MG tablet Take 1 tablet by mouth 2 (two) times daily with a meal.    . Cholecalciferol (VITAMIN D3) 50 MCG (2000 UT) CAPS Take by mouth.    . furosemide (LASIX) 40 MG tablet Take 40 mg by mouth 2 (two) times daily. 2 tablets in the morning 1 tablet in the afternoon as needed    . gabapentin (NEURONTIN) 100 MG capsule Take 1 capsule (100 mg total) by mouth at bedtime. 30 capsule 2  . glucose 4 GM chewable tablet Chew 1 tablet by mouth once as needed for low blood sugar.    . insulin aspart (NOVOLOG) 100 UNIT/ML injection Basal rate 12am 0.8 units per hour, 9am 0.9 units per hour. (total basal insulin 20.7 units). Carbohydrate ratio 1 units for every 8gm of carbohydrate. Correction factor 1 units for every 50mg /dl over target cbg. Target CBG 80-120 1 vial 12  . potassium chloride (KLOR-CON) 10 MEQ tablet TAKE 1 TABLET BY MOUTH TWICE DAILY FOR 3 DAYS THEN CONTINUE ONCE DAILY 30 tablet 0  . pravastatin (PRAVACHOL) 40 MG tablet Take 40 mg by mouth daily.     . vitamin B-12 (CYANOCOBALAMIN) 1000 MCG tablet Take 1,000 mcg by mouth daily.     Current Facility-Administered Medications on File Prior to Visit  Medication Dose Route  Frequency Provider Last Rate Last Admin  . heparin lock flush 100 unit/mL  500 Units Intravenous Once Lloyd Huger, MD      . sodium chloride flush (NS) 0.9 % injection 10 mL  10 mL Intravenous PRN Lloyd Huger, MD   10 mL at 02/01/18 0829  . sodium chloride flush (NS) 0.9 % injection 10 mL  10 mL Intravenous PRN Lloyd Huger, MD   10 mL at 04/05/18 0800    There are no Patient Instructions on file for this visit. No follow-ups on file.   Kris Hartmann, NP

## 2021-01-15 ENCOUNTER — Ambulatory Visit (INDEPENDENT_AMBULATORY_CARE_PROVIDER_SITE_OTHER): Payer: Medicare Other | Admitting: Nurse Practitioner

## 2021-01-15 ENCOUNTER — Other Ambulatory Visit: Payer: Self-pay

## 2021-01-15 ENCOUNTER — Encounter (INDEPENDENT_AMBULATORY_CARE_PROVIDER_SITE_OTHER): Payer: Self-pay

## 2021-01-15 VITALS — BP 119/71 | HR 93 | Resp 16 | Wt 232.0 lb

## 2021-01-15 DIAGNOSIS — L97201 Non-pressure chronic ulcer of unspecified calf limited to breakdown of skin: Secondary | ICD-10-CM | POA: Diagnosis not present

## 2021-01-15 NOTE — Progress Notes (Signed)
History of Present Illness  There is no documented history at this time  Assessments & Plan   There are no diagnoses linked to this encounter.    Additional instructions  Subjective:  Patient presents with venous ulcer of the Bilateral lower extremity.    Procedure:  3 layer unna wrap was placed Bilateral lower extremity.   Plan:   Follow up in one week.  

## 2021-01-17 ENCOUNTER — Ambulatory Visit (INDEPENDENT_AMBULATORY_CARE_PROVIDER_SITE_OTHER): Payer: Medicare Other | Admitting: *Deleted

## 2021-01-17 DIAGNOSIS — E1151 Type 2 diabetes mellitus with diabetic peripheral angiopathy without gangrene: Secondary | ICD-10-CM

## 2021-01-17 DIAGNOSIS — I5022 Chronic systolic (congestive) heart failure: Secondary | ICD-10-CM

## 2021-01-17 NOTE — Chronic Care Management (AMB) (Signed)
Chronic Care Management   CCM RN Visit Note  01/17/2021 Name: Gary [name] MRN: [id number] DOB: 02-28-1946  Subjective: Gary [name] is a 75 y.o. year old male who is a primary care patient of Gary Pheasant, MD. The care management team was consulted for assistance with disease management and care coordination needs.    Engaged with patient by telephone for follow up visit in response to provider referral for case management and/or care coordination services.   Consent to Services:  The patient was given information about Chronic Care Management services, agreed to services, and gave verbal consent prior to initiation of services.  Please see initial visit note for detailed documentation.   Patient agreed to services and verbal consent obtained.   Assessment: Review of patient past medical history, allergies, medications, health status, including review of consultants reports, laboratory and other test data, was performed as part of comprehensive evaluation and provision of chronic care management services.   SDOH (Social Determinants of Health) assessments and interventions performed:    CCM Care Plan  Allergies  Allergen Reactions  . Rofecoxib Nausea Only    Outpatient Encounter Medications as of 01/17/2021  Medication Sig Note  . furosemide (LASIX) 40 MG tablet Take 40 mg by mouth 2 (two) times daily. 2 tablets in the morning 1 tablet in the afternoon as needed   . carvedilol (COREG) 3.125 MG tablet Take 1 tablet by mouth 2 (two) times daily with a meal.   . Cholecalciferol (VITAMIN D3) 50 MCG (2000 UT) CAPS Take by mouth.   . gabapentin (NEURONTIN) 100 MG capsule Take 1 capsule (100 mg total) by mouth at bedtime. 12/18/2020: Reports not taking due to patient stating upsetting his stomach and making him dizzy  . glucose 4 GM chewable tablet Chew 1 tablet by mouth once as needed for low blood sugar.   . insulin aspart (NOVOLOG) 100 UNIT/ML injection Basal rate 12am 0.8 units per  hour, 9am 0.9 units per hour. (total basal insulin 20.7 units). Carbohydrate ratio 1 units for every 8gm of carbohydrate. Correction factor 1 units for every 50mg /dl over target cbg. Target CBG 80-120   . nystatin cream (MYCOSTATIN) Apply 1 application topically 2 (two) times daily.   . potassium chloride (KLOR-CON) 10 MEQ tablet TAKE 1 TABLET BY MOUTH TWICE DAILY FOR 3 DAYS THEN CONTINUE ONCE DAILY   . pravastatin (PRAVACHOL) 40 MG tablet Take 40 mg by mouth daily.    . vitamin B-12 (CYANOCOBALAMIN) 1000 MCG tablet Take 1,000 mcg by mouth daily.    Facility-Administered Encounter Medications as of 01/17/2021  Medication  . heparin lock flush 100 unit/mL  . sodium chloride flush (NS) 0.9 % injection 10 mL  . sodium chloride flush (NS) 0.9 % injection 10 mL    Patient Active Problem List   Diagnosis Date Noted  . Hypokalemia 10/24/2020  . Injury by nail 08/15/2020  . Shingles 07/24/2020  . Rash 07/02/2020  . Colon cancer screening 07/02/2020  . Sick sinus syndrome ([place]) 12/28/2019  . Acquired absence of kidney 09/13/2019  . Edema of lower extremity 09/13/2019  . 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 Proteinuria 09/13/2019  . Lower extremity pain, bilateral   . Diabetic foot ulcer associated with type 1 diabetes mellitus ([place])   . Acute on chronic systolic CHF (congestive heart failure) ([place])   . Cellulitis 08/18/2019  . Lower limb ulcer, calf ([place]) 08/01/2019  . Left  hip pain 07/16/2019  . Pain due to onychomycosis of toenails of both feet 04/03/2019  . Swelling of limb 02/07/2019  . Pancytopenia ([place]) 12/03/2018  . CKD (chronic kidney disease) stage 3, GFR 30-59 ml/min (HCC) 09/16/2018  . Left shoulder pain 09/16/2018  . Chronic cholecystitis 12/29/2017  . Graves disease 12/29/2017  . Peripheral neuropathy 12/29/2017  . S/p nephrectomy 12/29/2017  . 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  Symptomatic bradycardia 12/02/2017  . AKI (acute kidney injury) ([place]) 12/02/2017  . Chronic systolic CHF (congestive heart failure) ([place]) 12/02/2017  . Saturday night paralysis 11/12/2017  . Hypoglycemia 10/28/2017  . Goals of care, counseling/discussion 10/24/2017  . Lymphedema 10/20/2017  . Iron deficiency anemia 09/11/2017  . Fall 08/13/2017  . Humerus fracture 08/13/2017  . Anemia 01/12/2017  . Bilateral carotid artery stenosis 06/29/2016  . Hand laceration 12/16/2015  . Adjustment disorder with depressed mood 08/11/2015  . Cardiomyopathy, idiopathic ([place]) 04/22/2015  . CAD in native artery 11/02/2014  . Mantle cell lymphoma ([place]) 08/03/2014  . History of colonic polyps 04/29/2014  . Irregular heart beat 04/22/2014  . SOB (shortness of breath) 04/22/2014  . Stress 03/21/2014  . PVC (premature ventricular contraction) 02/21/2014  . GERD (gastroesophageal reflux disease) 10/23/2013  . Gastroparesis 06/25/2013  . Chest pain 04/13/2013  . Thyroid disease 04/13/2013  . Diabetes ([place]) 04/13/2013  . Essential hypertension, benign 04/13/2013  . Hypercholesterolemia 04/13/2013    Conditions to be addressed/monitored:CHF and DMII  Care Plan : Heart Failure (Adult)  Updates made by Leona Singleton, RN since 01/17/2021 12:00 AM  Problem: Disease Progression (Heart Failure)   Priority: Medium  Goal: Patient will report no heart failure exacerbations in the next 90 days   Start Date: 12/06/2020  Expected End Date: 03/06/2021  This Visit's Progress: Not on track  Recent Progress: Not on track  Priority: Medium  Current Barriers:  Marland Kitchen Knowledge deficit related to basic heart failure pathophysiology and self care management as evidenced by continued lower extremity edema requiring Unna Boots.  Patient continues to report he does not weigh himself at home.  States he weighs weekly at providers office when he gets his unna boots changed.  Now states he is unsure of where his home scale is at  this time.  Discussed importance of daily weight monitoring and encouraged patient to use home walker to help balance on his home scale.  Reports lower extremity edema is about the same (better than a year ago) and denies any increase in shortness of breath at this time. . Patient does not have transportation to provider appointments . Transportation Barriers; going to use [facility] transportation for appointments within [place] system and using [employer] for other medical appointments . Lacks social connections . Unable to perform IADLs independently Case Manager Clinical Goal(s):  . patient will verbalize understanding of Heart Failure Action Plan and when to call doctor . patient will take all Heart Failure mediations as prescribed . patient will weigh daily and record (notifying MD of 3 lb weight gain over night or 5 lb in a week) Interventions:  . Collaboration with Gary Pheasant, MD regarding development and update of comprehensive plan of care as evidenced by provider attestation and co-signature . Inter-disciplinary care team collaboration (see longitudinal plan of care) . Provided verbal education on low sodium diet . Advised patient to weigh each morning after emptying bladder . Discussed importance of daily weight and advised patient to weigh and record daily and encouraged patient to do  so; Reinforced need to weight daily and encouraged patient to use walker in front of scale to help balance while standing on scale . Advised and encouraged patient to find home scale and make sure that it works, seeking assistance from sister to help him locate the scale . Reviewed signs and symptoms of heart failure exacerbation . Encouraged elevation of lower extremities as able . Encouraged continued use of [employer] and [employer] transportation . Discussed medications and encouraged medication compliance . Sending 2022 [place] to help with logging of weights . Discussed  importance of weighing at home and not just at providers office Patient Goals/Self-Care Activities . Call office if I gain more than 2 pounds in one day or 5 pounds in one week . Use salt in moderation, low salt heart healthy diabetic diet . Find scale in the home and verify it still works . Weigh myself daily and write in log for provider review (at least start to weigh a few times a week working way up to daily) . Take medications as prescribed . Place walker in front of scale to help balance self while standing to weigh (please consider trying this to weigh yourself) Follow Up Plan: The care management team will reach out to the patient again over the next 10 business days.    Care Plan : Diabetes  Updates made by Leona Singleton, RN since 01/17/2021 12:00 AM  Problem: Hypoglycemia causing syncopal episodes   Priority: High  Long-Range Goal: Patient will report less episodes of hypoglycemia within the next 30 days   Start Date: 12/06/2020  Expected End Date: 06/10/2021  This Visit's Progress: On track  Recent Progress: Not on track  Priority: High  Note:   Objective:  Lab Results  Component Value Date   HGBA1C 6.8 (H) 06/24/2020 .   Lab Results  Component Value Date   CREATININE 1.40 10/24/2020   CREATININE 1.78 (H) 08/06/2020   CREATININE 2.01 (H) 07/29/2020   Current Barriers:  Marland Kitchen Knowledge Deficits related to basic Diabetes pathophysiology and self care/management as evidenced by multiple syncopal episodes related to hypoglycemia.  Patient continues to report some hypoglycemia.  States his blood sugars were in 100's at bedtime last week and he has adjusted his insulin per PCP request.  Fasting blood sugar this morning was 110.  States his lowest blood sugar in the last 2 weeks were in the 70's.  Denies having to disable insulin pump over the last 2 weeks related to hypoglycemia.  Denies having any black out spells related to hypoglycemia.  Reports blood sugars have ranged 70-300  (elevated after treating hypoglycemia).  Does report continued unsteadiness, but no actual falls.  Continues to decline home health services, stating he will think about it.  Has contacted [employer] and states his pump will end on 08/30/21 and he will discuss with Endocrinology next week. . Limited Social Support . Trouble getting insulin pump supplies-reports he now has insulin pump sensors Case Manager Clinical Goal(s):  . patient will demonstrate improved adherence to prescribed treatment plan for diabetes self care/management as evidenced by: at least 4 times a day monitoring and recording of CBG, adherence to ADA/ carb modified diet, adherence to prescribed medication regimen, contacting provider for new or worsened symptoms or questions Interventions:  . Collaboration with Gary Pheasant, MD regarding development and update of comprehensive plan of care as evidenced by provider attestation and co-signature . Inter-disciplinary care team collaboration (see longitudinal plan of care) . Provided  education to patient about basic DM disease process . Discussed medications and encouraged medication compliance . Discussed plans with patient for ongoing care management follow up and provided patient with direct contact information for care management team . Encouraged continued participation with CCM Social Worker and CCM Pharmacist and to consider starting VA benefits with aid and attendance . Reviewed glucose ranges and hypoglycemic events; and discussed dangers of hypoglycemia and treatment options, need for immediate sugar replacement with juice, sugar, soda in addition to snack or candy bar or glucose tablets . Instructed and encouraged patient to contact Endocrinologist to notify of continued hypoglycemia for possible medication/insulin adjustments; offered to contact Endocrinologist with/for patient (patient declines offer, stating he will contact himself) . Reviewed scheduled/upcoming provider  appointments including: Endocrinology appointment on 4/14, PCP 4/25, and confirmed patient has transportation arranged and knows whom can transport to appointments  . Discussed healthy meal and snack options . Rediscussed possible Home Health Nursing or Therapy referral for safety evaluation, educating and discussing even if they start services, patient can cancel at any time (patient still declines at this time, but states he will continue to think about it) . Congratulated patient on speaking with Insulin Pump Manufacture [employer] to verify warranty per endocrinology instructions; provided patient with number, and again offered to assist patient in making this call today(continues to decline assistance, stated he would call himself) . Discussed logging blood sugars and writing down insulin doses taken to help provider manage medications; sending 2022 Calendar Booklet to help with keeping log of blood sugars and insulin doses Patient Goals/Self-Care Activities . Check blood sugar at least 5 times a day . Check blood sugar if I feel it is too high or too low . Take the blood sugar meter and log to all doctor visits  . Consider drinking Boost or other supplement in the evenings if not eating a complete meal . Make sure to have candy or glucose tablets on you at all times; try drinking coke along with eating snack to treat hypoglycemia . Eat at least 3 meals a day with snack in between to help prevent hypoglycemia . Please consider Home Health Nursing/Therapy involvement . Call Dr. Honor Junes or send message in [place] to notify of continued hypoglycemia for medication adjustment . Attend appointment with Endocrinology on 01/23/21 making sure transportation prearranged Follow Up Plan: The care management team will reach out to the patient again over the next 10 business days.       Plan:The care management team will reach out to the patient again over the next 10 business days.  [name] RN,  MSN RN Care Management Coordinator [place] [phone] [email or web address]

## 2021-01-17 NOTE — Patient Instructions (Signed)
Visit Information  PATIENT GOALS: Goals Addressed            This Visit's Progress   . (RNCM) Monitor and Manage My Blood Sugar-Diabetes Type 1   On track    Timeframe:  Long-Range Goal Priority:  High Start Date:   12/06/20                          Expected End Date:  06/10/21                     Follow Up Date 01/27/2021    . Check blood sugar at least 5 times a day . Check blood sugar if I feel it is too high or too low . Take the blood sugar meter and log to all doctor visits  . Consider drinking Boost or other supplement in the evenings if not eating a complete meal . Make sure to have candy or glucose tablets on you at all times; try drinking coke along with eating snack to treat hypoglycemia . Eat at least 3 meals a day with snack in between to help prevent hypoglycemia . Please consider Home Health Nursing/Therapy involvement . Call Dr. Honor Junes or send message in [place] to notify of continued hypoglycemia for medication adjustment . Attend appointment with Endocrinology on 01/23/21 making sure transportation prearranged   Why is this important?    Checking your blood sugar at home helps to keep it from getting very high or very low.   Writing the results in a diary or log helps the doctor know how to care for you.   Your blood sugar log should have the time, the date and the results.   Also, write down the amount of insulin or other medicine you take.   Other information like what you ate, exercise done and how you were feeling will also be helpful..     Notes:     Marland Kitchen (RNCM) Track and Manage Fluids and Swelling-Heart Failure   Not on track    Timeframe:  Long-Range Goal Priority:  Medium Start Date:   12/06/20                          Expected End Date:  06/10/21                     Follow Up Date 01/27/21   . Call office if I gain more than 2 pounds in one day or 5 pounds in one week . Use salt in moderation, low salt heart healthy diabetic diet . Find scale in  the home and verify it still works . Weigh myself daily and write in log for provider review (at least start to weigh a few times a week working way up to daily) . Take medications as prescribed . Place walker in front of scale to help balance self while standing to weigh (please consider trying this to weigh yourself)   Why is this important?    It is important to check your weight daily and watch how much salt and liquids you have.   It will help you to manage your heart failure.    Notes:        Patient verbalizes understanding of instructions provided today and agrees to view in [place].   The care management team will reach out to the patient again over the next 10 business days.  [name] RN, MSN RN Care Management Coordinator [place] [phone] [email or web address]

## 2021-01-20 ENCOUNTER — Encounter (INDEPENDENT_AMBULATORY_CARE_PROVIDER_SITE_OTHER): Payer: Self-pay | Admitting: Nurse Practitioner

## 2021-01-22 ENCOUNTER — Ambulatory Visit (INDEPENDENT_AMBULATORY_CARE_PROVIDER_SITE_OTHER): Payer: Medicare Other | Admitting: Vascular Surgery

## 2021-01-22 ENCOUNTER — Other Ambulatory Visit: Payer: Self-pay

## 2021-01-22 VITALS — BP 130/71 | HR 71 | Resp 16 | Wt 232.8 lb

## 2021-01-22 DIAGNOSIS — I2510 Atherosclerotic heart disease of native coronary artery without angina pectoris: Secondary | ICD-10-CM | POA: Diagnosis not present

## 2021-01-22 DIAGNOSIS — I890 Lymphedema, not elsewhere classified: Secondary | ICD-10-CM

## 2021-01-22 DIAGNOSIS — M7989 Other specified soft tissue disorders: Secondary | ICD-10-CM

## 2021-01-22 NOTE — Progress Notes (Signed)
History of Present Illness  There is no documented history at this time  Assessments & Plan   There are no diagnoses linked to this encounter.    Additional instructions  Subjective:  Patient presents with venous ulcer of the Bilateral lower extremity.    Procedure:  3 layer unna wrap was placed Bilateral lower extremity.   Plan:   Follow up in one week.  

## 2021-01-23 DIAGNOSIS — E1159 Type 2 diabetes mellitus with other circulatory complications: Secondary | ICD-10-CM | POA: Diagnosis not present

## 2021-01-23 DIAGNOSIS — E785 Hyperlipidemia, unspecified: Secondary | ICD-10-CM | POA: Diagnosis not present

## 2021-01-23 DIAGNOSIS — E1069 Type 1 diabetes mellitus with other specified complication: Secondary | ICD-10-CM | POA: Diagnosis not present

## 2021-01-23 DIAGNOSIS — E10649 Type 1 diabetes mellitus with hypoglycemia without coma: Secondary | ICD-10-CM | POA: Diagnosis not present

## 2021-01-23 DIAGNOSIS — I152 Hypertension secondary to endocrine disorders: Secondary | ICD-10-CM | POA: Diagnosis not present

## 2021-01-23 LAB — HEMOGLOBIN A1C: Hemoglobin A1C: 7

## 2021-01-24 ENCOUNTER — Other Ambulatory Visit: Payer: Self-pay

## 2021-01-24 ENCOUNTER — Inpatient Hospital Stay: Payer: Medicare Other | Attending: Oncology

## 2021-01-24 ENCOUNTER — Inpatient Hospital Stay: Payer: Medicare Other

## 2021-01-24 ENCOUNTER — Ambulatory Visit
Admission: RE | Admit: 2021-01-24 | Discharge: 2021-01-24 | Disposition: A | Discharge status: Home / Self Care | Payer: Medicare Other | Source: Ambulatory Visit | Attending: Oncology | Admitting: Oncology

## 2021-01-24 DIAGNOSIS — Z803 Family history of malignant neoplasm of breast: Secondary | ICD-10-CM | POA: Insufficient documentation

## 2021-01-24 DIAGNOSIS — Z95828 Presence of other vascular implants and grafts: Secondary | ICD-10-CM

## 2021-01-24 DIAGNOSIS — D509 Iron deficiency anemia, unspecified: Secondary | ICD-10-CM | POA: Insufficient documentation

## 2021-01-24 DIAGNOSIS — C8318 Mantle cell lymphoma, lymph nodes of multiple sites: Secondary | ICD-10-CM | POA: Insufficient documentation

## 2021-01-24 DIAGNOSIS — N289 Disorder of kidney and ureter, unspecified: Secondary | ICD-10-CM | POA: Insufficient documentation

## 2021-01-24 DIAGNOSIS — Z9641 Presence of insulin pump (external) (internal): Secondary | ICD-10-CM | POA: Insufficient documentation

## 2021-01-24 DIAGNOSIS — D696 Thrombocytopenia, unspecified: Secondary | ICD-10-CM | POA: Insufficient documentation

## 2021-01-24 DIAGNOSIS — Z79899 Other long term (current) drug therapy: Secondary | ICD-10-CM | POA: Insufficient documentation

## 2021-01-24 DIAGNOSIS — Z87891 Personal history of nicotine dependence: Secondary | ICD-10-CM | POA: Insufficient documentation

## 2021-01-24 DIAGNOSIS — C8310 Mantle cell lymphoma, unspecified site: Secondary | ICD-10-CM | POA: Diagnosis not present

## 2021-01-24 DIAGNOSIS — I509 Heart failure, unspecified: Secondary | ICD-10-CM | POA: Insufficient documentation

## 2021-01-24 DIAGNOSIS — Z950 Presence of cardiac pacemaker: Secondary | ICD-10-CM | POA: Insufficient documentation

## 2021-01-24 DIAGNOSIS — S2241XA Multiple fractures of ribs, right side, initial encounter for closed fracture: Secondary | ICD-10-CM | POA: Diagnosis not present

## 2021-01-24 DIAGNOSIS — M160 Bilateral primary osteoarthritis of hip: Secondary | ICD-10-CM | POA: Diagnosis not present

## 2021-01-24 DIAGNOSIS — J984 Other disorders of lung: Secondary | ICD-10-CM | POA: Diagnosis not present

## 2021-01-24 DIAGNOSIS — M40204 Unspecified kyphosis, thoracic region: Secondary | ICD-10-CM | POA: Diagnosis not present

## 2021-01-24 DIAGNOSIS — I2510 Atherosclerotic heart disease of native coronary artery without angina pectoris: Secondary | ICD-10-CM | POA: Diagnosis not present

## 2021-01-24 DIAGNOSIS — E7800 Pure hypercholesterolemia, unspecified: Secondary | ICD-10-CM | POA: Insufficient documentation

## 2021-01-24 DIAGNOSIS — I700 Atherosclerosis of aorta: Secondary | ICD-10-CM | POA: Diagnosis not present

## 2021-01-24 DIAGNOSIS — I110 Hypertensive heart disease with heart failure: Secondary | ICD-10-CM | POA: Insufficient documentation

## 2021-01-24 DIAGNOSIS — E119 Type 2 diabetes mellitus without complications: Secondary | ICD-10-CM | POA: Insufficient documentation

## 2021-01-24 DIAGNOSIS — R001 Bradycardia, unspecified: Secondary | ICD-10-CM | POA: Insufficient documentation

## 2021-01-24 DIAGNOSIS — Z905 Acquired absence of kidney: Secondary | ICD-10-CM | POA: Insufficient documentation

## 2021-01-24 LAB — BASIC METABOLIC PANEL
Anion gap: 10 (ref 5–15)
BUN: 14 mg/dL (ref 8–23)
CO2: 26 mmol/L (ref 22–32)
Calcium: 7.9 mg/dL — ABNORMAL LOW (ref 8.9–10.3)
Chloride: 102 mmol/L (ref 98–111)
Creatinine, Ser: 1.19 mg/dL (ref 0.61–1.24)
GFR, Estimated: >60 mL/min (ref >60)
Glucose, Bld: 149 mg/dL — ABNORMAL HIGH (ref 70–99)
Potassium: 3.4 mmol/L — ABNORMAL LOW (ref 3.5–5.1)
Sodium: 138 mmol/L (ref 135–145)

## 2021-01-24 MED ORDER — SODIUM CHLORIDE 0.9% FLUSH
10.0000 mL | Freq: Once | INTRAVENOUS | Status: AC
  Administered 2021-01-24: 10 mL via INTRAVENOUS
  Filled 2021-01-24: qty 10

## 2021-01-24 MED ORDER — HEPARIN SOD (PORK) LOCK FLUSH 100 UNIT/ML IV SOLN
500.0000 [IU] | Freq: Once | INTRAVENOUS | Status: AC
  Administered 2021-01-24: 500 [IU] via INTRAVENOUS
  Filled 2021-01-24: qty 5

## 2021-01-26 NOTE — Progress Notes (Signed)
[place]  [phone] Fax:(336) [phone]  ID: Gary [name] OB: Apr 27, 1946  MR#: [id number]  QQV#:[id number]  Patient Care Team: [name], MD as PCP - General (Internal Medicine) Lloyd Huger, MD as Consulting Physician (Oncology) Lucky Cowboy, Erskine Squibb, MD as Referring Physician (Vascular Surgery) Schnier, Dolores Lory, MD (Vascular Surgery) Corey Skains, MD as Consulting Physician (Cardiology) De Hollingshead, RPH-[name] (Pharmacist) Leona Singleton, RN as Case Manager Saporito, Maree Erie, LCSW as Social Worker (Licensed Clinical Social Worker)  CHIEF COMPLAINT: Mantle cell lymphoma.  INTERVAL HISTORY: Patient returns to clinic today for routine 47-month evaluation and discussion of his imaging results.  He currently feels well and is asymptomatic.  He reports a low blood sugar today, but otherwise is managing his insulin pump well.  He does not complain of weakness or fatigue today.  He denies any fevers, chills, or night sweats.  He has no neurologic complaints.  He denies any chest pain, shortness of breath, cough, or hemoptysis.  He has no nausea, vomiting, constipation, or diarrhea.  He denies any melena or hematochezia.  He has no urinary complaints.  Patient offers no further specific complaints today.  Review of Systems  Constitutional: Negative.  Negative for diaphoresis, fever, malaise/fatigue and weight loss.  Respiratory: Negative.  Negative for cough, hemoptysis and shortness of breath.   Cardiovascular: Negative.  Negative for chest pain and leg swelling.  Gastrointestinal: Negative.  Negative for abdominal pain.  Genitourinary: Negative.  Negative for dysuria.  Musculoskeletal: Negative.  Negative for back pain.  Skin: Negative.  Negative for rash.  Neurological: Negative.  Negative for dizziness, focal weakness, weakness and headaches.  Psychiatric/Behavioral: Negative.  The patient is not nervous/anxious.    As per HPI.  Otherwise, a complete review of systems is negative.  PAST MEDICAL HISTORY: Past Medical History:  Diagnosis Date  . CHF (congestive heart failure) ([place])   . Depression   . 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 Gastroparesis   . Graves disease   . Hypercholesterolemia   . Hypertension   . Mantle cell lymphoma ([place]) 07/2014  . Peripheral neuropathy   . Shingles     PAST SURGICAL HISTORY: Past Surgical History:  Procedure Laterality Date  . CHOLECYSTECTOMY    . NEPHRECTOMY     right  . PACEMAKER INSERTION N/A 12/07/2017   Procedure: INSERTION PACEMAKER DUAL CHAMBER INITIAL INSERT;  Surgeon: Isaias Cowman, MD;  Location: [facility] ORS;  Service: Cardiovascular;  Laterality: N/A;  . PORTA CATH INSERTION N/A 11/03/2017   Procedure: PORTA CATH INSERTION;  Surgeon: Katha Cabal, MD;  Location: [facility];  Service: Cardiovascular;  Laterality: N/A;    FAMILY HISTORY Family History  Problem Relation Age of Onset  . Breast cancer Mother   . Diabetes Father   . Cancer Father        Lymphoma  . Alcohol abuse Maternal Uncle   . Diabetes Sister   . Heart disease Other        grandfather       ADVANCED DIRECTIVES:    HEALTH MAINTENANCE: Social History   Tobacco Use  . Smoking status: Former Research scientist (life sciences)  . Smokeless tobacco: Current User    Types: Chew  Vaping Use  . Vaping Use: Never used  Substance Use Topics  . Alcohol use: Yes    Alcohol/week: 3.0 standard drinks    Types: 3 Cans of beer per week  Comment: nightly  . Drug use: Never     Colonoscopy:  PAP:  Bone density:  Lipid panel:  Allergies  Allergen Reactions  . Rofecoxib Nausea Only    Current Outpatient Medications  Medication Sig Dispense Refill  . carvedilol (COREG) 3.125 MG tablet Take 1 tablet by mouth 2 (two) times daily with a meal.    . Cholecalciferol (VITAMIN D3) 50 MCG (2000 UT) CAPS Take by mouth.    . furosemide  (LASIX) 40 MG tablet Take 40 mg by mouth 2 (two) times daily. 2 tablets in the morning 1 tablet in the afternoon as needed    . glucose 4 GM chewable tablet Chew 1 tablet by mouth once as needed for low blood sugar.    . insulin aspart (NOVOLOG) 100 UNIT/ML injection Basal rate 12am 0.8 units per hour, 9am 0.9 units per hour. (total basal insulin 20.7 units). Carbohydrate ratio 1 units for every 8gm of carbohydrate. Correction factor 1 units for every 50mg /dl over target cbg. Target CBG 80-120 1 vial 12  . nystatin cream (MYCOSTATIN) Apply 1 application topically 2 (two) times daily. 30 g 0  . potassium chloride (KLOR-CON) 10 MEQ tablet TAKE 1 TABLET BY MOUTH TWICE DAILY FOR 3 DAYS THEN CONTINUE ONCE DAILY 30 tablet 0  . pravastatin (PRAVACHOL) 40 MG tablet Take 40 mg by mouth daily.     . vitamin B-12 (CYANOCOBALAMIN) 1000 MCG tablet Take 1,000 mcg by mouth daily.    Marland Kitchen gabapentin (NEURONTIN) 100 MG capsule Take 1 capsule (100 mg total) by mouth at bedtime. (Patient not taking: Reported on 01/30/2021) 30 capsule 2   No current facility-administered medications for this visit.   Facility-Administered Medications Ordered in Other Visits  Medication Dose Route Frequency Provider Last Rate Last Admin  . heparin lock flush 100 unit/mL  500 Units Intravenous Once Lloyd Huger, MD      . sodium chloride flush (NS) 0.9 % injection 10 mL  10 mL Intravenous PRN Lloyd Huger, MD   10 mL at 02/01/18 0829  . sodium chloride flush (NS) 0.9 % injection 10 mL  10 mL Intravenous PRN Lloyd Huger, MD   10 mL at 04/05/18 0800    OBJECTIVE: Vitals:   01/30/21 1105  BP: 117/64  Pulse: 71  Resp: 18  Temp: 98.2 F (36.8 C)  SpO2: 99%     Body mass index is 33.86 kg/m.    ECOG FS:0 - Asymptomatic  General: Well-developed, well-nourished, no acute distress. Eyes: Pink conjunctiva, anicteric sclera. HEENT: Normocephalic, moist mucous membranes. Lungs: No audible wheezing or  coughing. Heart: Regular rate and rhythm. Abdomen: Soft, nontender, no obvious distention. Musculoskeletal: No edema, cyanosis, or clubbing. Neuro: Alert, answering all questions appropriately. Cranial nerves grossly intact. Skin: No rashes or petechiae noted. Psych: Normal affect.   LAB RESULTS:  Lab Results  Component Value Date   NA 138 01/24/2021   K 3.4 (L) 01/24/2021   CL 102 01/24/2021   CO2 26 01/24/2021   GLUCOSE 149 (H) 01/24/2021   BUN 14 01/24/2021   CREATININE 1.19 01/24/2021   CALCIUM 7.9 (L) 01/24/2021   PROT 7.1 07/29/2020   ALBUMIN 3.4 (L) 07/29/2020   AST 24 07/29/2020   ALT 16 07/29/2020   ALKPHOS 115 07/29/2020   BILITOT 1.1 07/29/2020   GFRNONAA >60 01/24/2021   GFRAA 41 (L) 01/24/2020    Lab Results  Component Value Date   WBC 6.1 07/29/2020   NEUTROABS 4.0 07/29/2020  HGB 13.9 07/29/2020   HCT 40.0 07/29/2020   MCV 90.9 07/29/2020   PLT 102 (L) 07/29/2020   Lab Results  Component Value Date   IRON 13 (L) 08/11/2017   TIBC 223 (L) 01/12/2017   IRONPCTSAT 3.8 (L) 08/11/2017   Lab Results  Component Value Date   FERRITIN 42.0 08/11/2017     STUDIES:   ASSESSMENT: Low-grade mantle cell lymphoma diagnosed in September 2015, iron deficiency anemia.  PLAN:    1. Mantle cell lymphoma, stage III: Patient's initial diagnosis was on inguinal lymph node biopsy on July 12, 2014.  Patient was noted to have progressive disease and underwent chemotherapy using Rituxan and Treanda completing cycle 4 on Mar 09, 2018.  His most recent CT scan on January 24, 2021 reviewed independently and reported as above with no obvious evidence of recurrent or progressive disease.  No intervention is needed at this time.  Patient is now nearly 3 years removed from completing treatment and can be transitioned to yearly evaluation and imaging.   2. Decreased ejection fraction: Chronic and unchanged.  Previously patient had an ejection fraction of 40% with no clinical  CHF.  Continue monitoring and evaluation per cardiology. 3. Diabetes: Patient has improved blood glucose control.  Continue insulin pump as directed. 4. Thrombocytopenia: Chronic and unchanged. 5.  Bradycardia: Patient has pacemaker in place. 6.  Renal insufficiency: Improved.  Patient's creatinine is 1.19.  Continue follow-up with nephrology as scheduled. 6.  Weight loss: Patient reports improved appetite and weight gain.  Patient expressed understanding and was in agreement with this plan. He also understands that He can call clinic at any time with any questions, concerns, or complaints.    Lloyd Huger, MD   01/31/2021 12:58 PM

## 2021-01-27 ENCOUNTER — Ambulatory Visit: Payer: Medicare Other | Admitting: *Deleted

## 2021-01-27 ENCOUNTER — Other Ambulatory Visit: Payer: Self-pay | Admitting: Internal Medicine

## 2021-01-27 DIAGNOSIS — F4321 Adjustment disorder with depressed mood: Secondary | ICD-10-CM | POA: Diagnosis not present

## 2021-01-27 DIAGNOSIS — I5022 Chronic systolic (congestive) heart failure: Secondary | ICD-10-CM | POA: Diagnosis not present

## 2021-01-27 DIAGNOSIS — E10649 Type 1 diabetes mellitus with hypoglycemia without coma: Secondary | ICD-10-CM

## 2021-01-27 DIAGNOSIS — E162 Hypoglycemia, unspecified: Secondary | ICD-10-CM

## 2021-01-27 DIAGNOSIS — E1151 Type 2 diabetes mellitus with diabetic peripheral angiopathy without gangrene: Secondary | ICD-10-CM

## 2021-01-27 DIAGNOSIS — I10 Essential (primary) hypertension: Secondary | ICD-10-CM | POA: Diagnosis not present

## 2021-01-27 NOTE — Patient Instructions (Signed)
Visit Information  PATIENT GOALS: Goals Addressed            This Visit's Progress   . (RNCM) Monitor and Manage My Blood Sugar-Diabetes Type 1   On track    Timeframe:  Long-Range Goal Priority:  High Start Date:   12/06/20                          Expected End Date:  06/10/21                     Follow Up Date 02/17/2021    . Check blood sugar at least 5 times a day . Check blood sugar if I feel it is too high or too low . Take the blood sugar meter and log to all doctor visits  . Consider drinking Boost or other supplement in the evenings if not eating a complete meal . Make sure to have candy or glucose tablets on you at all times; try drinking coke along with eating snack to treat hypoglycemia . Eat at least 3 meals a day with snack in between to help prevent hypoglycemia . Please consider Home Health Nursing/Therapy involvement   Why is this important?    Checking your blood sugar at home helps to keep it from getting very high or very low.   Writing the results in a diary or log helps the doctor know how to care for you.   Your blood sugar log should have the time, the date and the results.   Also, write down the amount of insulin or other medicine you take.   Other information like what you ate, exercise done and how you were feeling will also be helpful..     Notes:     Marland Kitchen (RNCM) Track and Manage Fluids and Swelling-Heart Failure   Not on track    Timeframe:  Long-Range Goal Priority:  Medium Start Date:   12/06/20                          Expected End Date:  06/10/21                     Follow Up Date 02/17/21   . Call office if I gain more than 2 pounds in one day or 5 pounds in one week . Use salt in moderation, low salt heart healthy diabetic diet . Find scale in the home and verify it still works . Weigh myself daily and write in log for provider review (at least start to weigh a few times a week working way up to daily) . Take medications as  prescribed . Place walker in front of scale to help balance self while standing to weigh (please consider trying this to weigh yourself) . Ask sister to help you find your walker and verify if your scale is working properly   Why is this important?    It is important to check your weight daily and watch how much salt and liquids you have.   It will help you to manage your heart failure.    Notes:        Patient verbalizes understanding of instructions provided today and agrees to view in [place].   The care management team will reach out to the patient again over the next 30 business days.   [name] RN, MSN RN Care Management Coordinator [facility] [phone] [email or web address]

## 2021-01-27 NOTE — Chronic Care Management (AMB) (Signed)
Chronic Care Management   CCM RN Visit Note  01/27/2021 Name: MARCAS BOWSHER MRN: [id number] DOB: [birth year]  Subjective: Gary [name] is a 75 y.o. year old male who is a primary care patient of [name], MD. The care management team was consulted for assistance with disease management and care coordination needs.    Engaged with patient by telephone for follow up visit in response to provider referral for case management and/or care coordination services.   Consent to Services:  The patient was given information about Chronic Care Management services, agreed to services, and gave verbal consent prior to initiation of services.  Please see initial visit note for detailed documentation.   Patient agreed to services and verbal consent obtained.   Assessment: Review of patient past medical history, allergies, medications, health status, including review of consultants reports, laboratory and other test data, was performed as part of comprehensive evaluation and provision of chronic care management services.   SDOH (Social Determinants of Health) assessments and interventions performed:    CCM Care Plan  Allergies  Allergen Reactions  . Rofecoxib Nausea Only    Outpatient Encounter Medications as of 01/27/2021  Medication Sig Note  . carvedilol (COREG) 3.125 MG tablet Take 1 tablet by mouth 2 (two) times daily with a meal.   . Cholecalciferol (VITAMIN D3) 50 MCG (2000 UT) CAPS Take by mouth.   . furosemide (LASIX) 40 MG tablet Take 40 mg by mouth 2 (two) times daily. 2 tablets in the morning 1 tablet in the afternoon as needed   . gabapentin (NEURONTIN) 100 MG capsule Take 1 capsule (100 mg total) by mouth at bedtime. 12/18/2020: Reports not taking due to patient stating upsetting his stomach and making him dizzy  . glucose 4 GM chewable tablet Chew 1 tablet by mouth once as needed for low blood sugar.   . insulin aspart (NOVOLOG) 100 UNIT/ML injection Basal rate 12am 0.8 units per  hour, 9am 0.9 units per hour. (total basal insulin 20.7 units). Carbohydrate ratio 1 units for every 8gm of carbohydrate. Correction factor 1 units for every 50mg /dl over target cbg. Target CBG 80-120   . nystatin cream (MYCOSTATIN) Apply 1 application topically 2 (two) times daily.   . potassium chloride (KLOR-CON) 10 MEQ tablet TAKE 1 TABLET BY MOUTH TWICE DAILY FOR 3 DAYS THEN CONTINUE ONCE DAILY   . pravastatin (PRAVACHOL) 40 MG tablet Take 40 mg by mouth daily.    . vitamin B-12 (CYANOCOBALAMIN) 1000 MCG tablet Take 1,000 mcg by mouth daily.    Facility-Administered Encounter Medications as of 01/27/2021  Medication  . heparin lock flush 100 unit/mL  . sodium chloride flush (NS) 0.9 % injection 10 mL  . sodium chloride flush (NS) 0.9 % injection 10 mL    Patient Active Problem List   Diagnosis Date Noted  . Hypokalemia 10/24/2020  . Injury by nail 08/15/2020  . Shingles 07/24/2020  . Rash 07/02/2020  . Colon cancer screening 07/02/2020  . Sick sinus syndrome ([place]) 12/28/2019  . Acquired absence of kidney 09/13/2019  . Edema of lower extremity 09/13/2019  . 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 Proteinuria 09/13/2019  . Lower extremity pain, bilateral   . Diabetic foot ulcer associated with type 1 diabetes mellitus ([place])   . Acute on chronic systolic CHF (congestive heart failure) ([place])   . Cellulitis 08/18/2019  . Lower limb ulcer, calf ([place]) 08/01/2019  . Left  hip pain 07/16/2019  . Pain due to onychomycosis of toenails of both feet 04/03/2019  . Swelling of limb 02/07/2019  . Pancytopenia ([place]) 12/03/2018  . CKD (chronic kidney disease) stage 3, GFR 30-59 ml/min (HCC) 09/16/2018  . Left shoulder pain 09/16/2018  . Chronic cholecystitis 12/29/2017  . Graves disease 12/29/2017  . Peripheral neuropathy 12/29/2017  . S/p nephrectomy 12/29/2017  . 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  Symptomatic bradycardia 12/02/2017  . AKI (acute kidney injury) ([place]) 12/02/2017  . Chronic systolic CHF (congestive heart failure) ([place]) 12/02/2017  . Saturday night paralysis 11/12/2017  . Hypoglycemia 10/28/2017  . Goals of care, counseling/discussion 10/24/2017  . Lymphedema 10/20/2017  . Iron deficiency anemia 09/11/2017  . Fall 08/13/2017  . Humerus fracture 08/13/2017  . Anemia 01/12/2017  . Bilateral carotid artery stenosis 06/29/2016  . Hand laceration 12/16/2015  . Adjustment disorder with depressed mood 08/11/2015  . Cardiomyopathy, idiopathic ([name]) 04/22/2015  . CAD in native artery 11/02/2014  . Mantle cell lymphoma ([place]) 08/03/2014  . History of colonic polyps 04/29/2014  . Irregular heart beat 04/22/2014  . SOB (shortness of breath) 04/22/2014  . Stress 03/21/2014  . PVC (premature ventricular contraction) 02/21/2014  . GERD (gastroesophageal reflux disease) 10/23/2013  . Gastroparesis 06/25/2013  . Chest pain 04/13/2013  . Thyroid disease 04/13/2013  . Diabetes ([place]) 04/13/2013  . Essential hypertension, benign 04/13/2013  . Hypercholesterolemia 04/13/2013    Conditions to be addressed/monitored:CHF and DMII  Care Plan : Heart Failure (Adult)  Updates made by Leona Singleton, RN since 01/27/2021 12:00 AM  Problem: Disease Progression (Heart Failure)   Priority: Medium  Goal: Patient will report no heart failure exacerbations in the next 90 days   Start Date: 12/06/2020  Expected End Date: 03/06/2021  This Visit's Progress: Not on track  Recent Progress: Not on track  Priority: Medium  Current Barriers:  Marland Kitchen Knowledge deficit related to basic heart failure pathophysiology and self care management as evidenced by continued lower extremity edema requiring Unna Boots.  Patient continues to report he does not weigh himself at home.  States he weighs weekly at providers office when he gets his unna boots changed.  Now states he is unsure of where his home scale is  at this time and where his walker is.  Discussed importance of daily weight monitoring and encouraged patient to use home walker to help balance on his home scale.  Reports lower extremity edema is about the same (better than a year ago) and denies any increase in shortness of breath at this time. . Patient does not have transportation to provider appointments . Transportation Barriers; going to use [facility] transportation for appointments within [place] system and using [employer] for other medical appointments . Lacks social connections . Unable to perform IADLs independently Case Manager Clinical Goal(s):  . patient will verbalize understanding of Heart Failure Action Plan and when to call doctor . patient will take all Heart Failure mediations as prescribed . patient will weigh daily and record (notifying MD of 3 lb weight gain over night or 5 lb in a week) Interventions:  . Collaboration with [name], MD regarding development and update of comprehensive plan of care as evidenced by provider attestation and co-signature . Inter-disciplinary care team collaboration (see longitudinal plan of care) . Provided verbal education on low sodium diet . Advised patient to weigh each morning after emptying bladder . Discussed importance of daily weight and advised patient to weigh and record daily  and encouraged patient to do so; Reinforced need to weight daily and encouraged patient to use walker in front of scale to help balance while standing on scale . Advised and encouraged patient to find home scale and make sure that it works, seeking assistance from sister to help him locate the scale . Also discussed with patient asking sister to help him find walker so he can stand on scale, discussed leaving her a note or sending text message to sister . Reviewed signs and symptoms of heart failure exacerbation . Encouraged elevation of lower extremities as able . Encouraged continued use of [employer] and [employer] transportation . Discussed medications and encouraged medication compliance . Sent 2022 [facility] Calendar Booklet to help with logging of weights . Discussed importance of weighing at home and not just at providers office Patient Goals/Self-Care Activities . Call office if I gain more than 2 pounds in one day or 5 pounds in one week . Use salt in moderation, low salt heart healthy diabetic diet . Find scale in the home and verify it still works . Weigh myself daily and write in log for provider review (at least start to weigh a few times a week working way up to daily) . Take medications as prescribed . Place walker in front of scale to help balance self while standing to weigh (please consider trying this to weigh yourself) . Ask sister to help you find your walker and verify if your scale is working properly  Follow Up Plan: The care management team will reach out to the patient again over the next 30 business days.    Care Plan : Diabetes  Updates made by Leona Singleton, RN since 01/27/2021 12:00 AM  Problem: Hypoglycemia causing syncopal episodes   Priority: Medium  Long-Range Goal: Patient will report maintaing Hgb A1C of 7 or below in the next 90 days   Start Date: 01/27/2021  Expected End Date: 08/11/2021  This Visit's Progress: On track  Recent Progress: On track  Priority: Medium  Objective:  Lab Results  Component Value Date   HGBA1C 6.8 (H) 06/24/2020 .   Lab Results  Component Value Date   CREATININE 1.40 10/24/2020   CREATININE 1.78 (H) 08/06/2020   CREATININE 2.01 (H) 07/29/2020   Current Barriers:  Marland Kitchen Knowledge Deficits related to basic Diabetes pathophysiology and self care/management as evidenced by multiple syncopal episodes related to hypoglycemia.  Patient reports fasting blood sugar this morning was 130.  Does report some hypoglycemia in the past 2 weeks but states the lows have only been in the 70's and he was able to treat with  "tiny cupcakes".  Did attend his Endocrinology appointment on 4/14 and latest Hgb A1C was 7.  Per Endocrinology notes insulin pump doses were adjusted.  Does report his Dexom meter is hard to charge due to plug and is awaiting new meter in the mail. . Limited Social Support . Trouble getting insulin pump supplies-reports he now has insulin pump sensors Case Manager Clinical Goal(s):  . patient will demonstrate improved adherence to prescribed treatment plan for diabetes self care/management as evidenced by: at least 4 times a day monitoring and recording of CBG, adherence to ADA/ carb modified diet, adherence to prescribed medication regimen, contacting provider for new or worsened symptoms or questions Interventions:  . Collaboration with [name], MD regarding development and update of comprehensive plan of care as evidenced by provider attestation and co-signature . Inter-disciplinary care team collaboration (see longitudinal  plan of care) . Provided education to patient about basic DM disease process . Discussed medications and encouraged medication compliance . Discussed plans with patient for ongoing care management follow up and provided patient with direct contact information for care management team . Encouraged continued participation with CCM Pharmacist  . Reviewed glucose ranges and hypoglycemic events; and discussed dangers of hypoglycemia and treatment options, need for immediate sugar replacement with juice, sugar, soda in addition to snack or candy bar or glucose tablets . Discussed proper treatment of hypoglycemia . Confirmed patient has food to eat today since [employer] not delivering today due to holiday (Easter Monday) . Reviewed scheduled/upcoming provider appointments including: [facility] on 03/07/21, PCP 02/03/21 . Discussed healthy meal and snack options . Rediscussed possible Home Health Nursing or Therapy referral for safety evaluation, educating and discussing even if they start  services, patient can cancel at any time (patient still declines at this time, but states he will continue to think about it) . Encouraged patient to contact Endocrinology if he does not receive Dexcom Meter replacement soon . Congratulated patient on Hgb A1C at 7 . Discussed logging blood sugars and writing down insulin doses taken to help provider manage medications; sent 2022 Calendar Booklet to help with keeping log of blood sugars and insulin doses Patient Goals/Self-Care Activities . Check blood sugar at least 5 times a day . Check blood sugar if I feel it is too high or too low . Take the blood sugar meter and log to all doctor visits  . Consider drinking Boost or other supplement in the evenings if not eating a complete meal . Make sure to have candy or glucose tablets on you at all times; try drinking coke along with eating snack to treat hypoglycemia . Eat at least 3 meals a day with snack in between to help prevent hypoglycemia . Please consider Home Health Nursing/Therapy involvement Follow Up Plan: The care management team will reach out to the patient again over the next 30 business days.       Plan:The care management team will reach out to the patient again over the next 30 business days.  [name] RN, MSN RN Care Management Coordinator [place] [phone] [email or web address]

## 2021-01-29 ENCOUNTER — Ambulatory Visit (INDEPENDENT_AMBULATORY_CARE_PROVIDER_SITE_OTHER): Payer: Medicare Other | Admitting: Nurse Practitioner

## 2021-01-29 ENCOUNTER — Other Ambulatory Visit: Payer: Self-pay

## 2021-01-29 ENCOUNTER — Encounter (INDEPENDENT_AMBULATORY_CARE_PROVIDER_SITE_OTHER): Payer: Self-pay | Admitting: Nurse Practitioner

## 2021-01-29 VITALS — BP 132/67 | HR 68 | Resp 16 | Wt 232.0 lb

## 2021-01-29 DIAGNOSIS — I890 Lymphedema, not elsewhere classified: Secondary | ICD-10-CM | POA: Diagnosis not present

## 2021-01-29 DIAGNOSIS — E7800 Pure hypercholesterolemia, unspecified: Secondary | ICD-10-CM

## 2021-01-29 DIAGNOSIS — I10 Essential (primary) hypertension: Secondary | ICD-10-CM | POA: Diagnosis not present

## 2021-01-30 ENCOUNTER — Encounter: Payer: Self-pay | Admitting: Oncology

## 2021-01-30 ENCOUNTER — Inpatient Hospital Stay (HOSPITAL_BASED_OUTPATIENT_CLINIC_OR_DEPARTMENT_OTHER): Payer: Medicare Other | Admitting: Oncology

## 2021-01-30 VITALS — BP 117/64 | HR 71 | Temp 98.2°F | Resp 18 | Wt 236.0 lb

## 2021-01-30 DIAGNOSIS — C8318 Mantle cell lymphoma, lymph nodes of multiple sites: Secondary | ICD-10-CM | POA: Diagnosis not present

## 2021-01-30 DIAGNOSIS — D509 Iron deficiency anemia, unspecified: Secondary | ICD-10-CM | POA: Diagnosis not present

## 2021-01-30 DIAGNOSIS — E119 Type 2 diabetes mellitus without complications: Secondary | ICD-10-CM | POA: Diagnosis not present

## 2021-01-30 DIAGNOSIS — C8310 Mantle cell lymphoma, unspecified site: Secondary | ICD-10-CM | POA: Diagnosis not present

## 2021-01-30 DIAGNOSIS — E7800 Pure hypercholesterolemia, unspecified: Secondary | ICD-10-CM | POA: Diagnosis not present

## 2021-01-30 DIAGNOSIS — Z87891 Personal history of nicotine dependence: Secondary | ICD-10-CM | POA: Diagnosis not present

## 2021-01-30 DIAGNOSIS — Z905 Acquired absence of kidney: Secondary | ICD-10-CM | POA: Diagnosis not present

## 2021-01-30 DIAGNOSIS — I509 Heart failure, unspecified: Secondary | ICD-10-CM | POA: Diagnosis not present

## 2021-01-30 DIAGNOSIS — D696 Thrombocytopenia, unspecified: Secondary | ICD-10-CM | POA: Diagnosis not present

## 2021-01-30 DIAGNOSIS — I110 Hypertensive heart disease with heart failure: Secondary | ICD-10-CM | POA: Diagnosis not present

## 2021-01-30 DIAGNOSIS — Z9641 Presence of insulin pump (external) (internal): Secondary | ICD-10-CM | POA: Diagnosis not present

## 2021-01-30 DIAGNOSIS — Z803 Family history of malignant neoplasm of breast: Secondary | ICD-10-CM | POA: Diagnosis not present

## 2021-01-30 DIAGNOSIS — I2510 Atherosclerotic heart disease of native coronary artery without angina pectoris: Secondary | ICD-10-CM

## 2021-01-30 DIAGNOSIS — N289 Disorder of kidney and ureter, unspecified: Secondary | ICD-10-CM | POA: Diagnosis not present

## 2021-01-30 DIAGNOSIS — R001 Bradycardia, unspecified: Secondary | ICD-10-CM | POA: Diagnosis not present

## 2021-01-30 DIAGNOSIS — Z950 Presence of cardiac pacemaker: Secondary | ICD-10-CM | POA: Diagnosis not present

## 2021-01-30 DIAGNOSIS — Z79899 Other long term (current) drug therapy: Secondary | ICD-10-CM | POA: Diagnosis not present

## 2021-01-30 NOTE — Progress Notes (Signed)
Pt in for follow up and CT results.  

## 2021-02-02 NOTE — Progress Notes (Addendum)
O

## 2021-02-03 ENCOUNTER — Ambulatory Visit: Payer: Medicare Other | Admitting: Pharmacist

## 2021-02-03 ENCOUNTER — Other Ambulatory Visit: Payer: Self-pay

## 2021-02-03 ENCOUNTER — Encounter (INDEPENDENT_AMBULATORY_CARE_PROVIDER_SITE_OTHER): Payer: Self-pay | Admitting: Nurse Practitioner

## 2021-02-03 ENCOUNTER — Ambulatory Visit (INDEPENDENT_AMBULATORY_CARE_PROVIDER_SITE_OTHER): Payer: Medicare Other | Admitting: Internal Medicine

## 2021-02-03 DIAGNOSIS — I5022 Chronic systolic (congestive) heart failure: Secondary | ICD-10-CM

## 2021-02-03 DIAGNOSIS — F439 Reaction to severe stress, unspecified: Secondary | ICD-10-CM

## 2021-02-03 DIAGNOSIS — I10 Essential (primary) hypertension: Secondary | ICD-10-CM

## 2021-02-03 DIAGNOSIS — I2510 Atherosclerotic heart disease of native coronary artery without angina pectoris: Secondary | ICD-10-CM

## 2021-02-03 DIAGNOSIS — D61818 Other pancytopenia: Secondary | ICD-10-CM | POA: Diagnosis not present

## 2021-02-03 DIAGNOSIS — E079 Disorder of thyroid, unspecified: Secondary | ICD-10-CM

## 2021-02-03 DIAGNOSIS — I890 Lymphedema, not elsewhere classified: Secondary | ICD-10-CM

## 2021-02-03 DIAGNOSIS — E10649 Type 1 diabetes mellitus with hypoglycemia without coma: Secondary | ICD-10-CM

## 2021-02-03 DIAGNOSIS — C8318 Mantle cell lymphoma, lymph nodes of multiple sites: Secondary | ICD-10-CM

## 2021-02-03 DIAGNOSIS — I495 Sick sinus syndrome: Secondary | ICD-10-CM

## 2021-02-03 DIAGNOSIS — I429 Cardiomyopathy, unspecified: Secondary | ICD-10-CM

## 2021-02-03 DIAGNOSIS — D509 Iron deficiency anemia, unspecified: Secondary | ICD-10-CM

## 2021-02-03 DIAGNOSIS — E7800 Pure hypercholesterolemia, unspecified: Secondary | ICD-10-CM

## 2021-02-03 DIAGNOSIS — I428 Other cardiomyopathies: Secondary | ICD-10-CM

## 2021-02-03 DIAGNOSIS — E1151 Type 2 diabetes mellitus with diabetic peripheral angiopathy without gangrene: Secondary | ICD-10-CM

## 2021-02-03 DIAGNOSIS — F4321 Adjustment disorder with depressed mood: Secondary | ICD-10-CM

## 2021-02-03 DIAGNOSIS — N1832 Chronic kidney disease, stage 3b: Secondary | ICD-10-CM

## 2021-02-03 DIAGNOSIS — L97201 Non-pressure chronic ulcer of unspecified calf limited to breakdown of skin: Secondary | ICD-10-CM | POA: Diagnosis not present

## 2021-02-03 DIAGNOSIS — S41112A Laceration without foreign body of left upper arm, initial encounter: Secondary | ICD-10-CM

## 2021-02-03 NOTE — Patient Instructions (Addendum)
Please think about if you want to see about pill packaging from [facility] or one of the other local pharmacies.   Please FIND YOUR SCALE AND WEIGH YOURSELF EVERY DAY. Write that down in your blue book.   I'm going to call you TOMORROW around 2 pm to review your pill bottles.   Here is how I want you take your medications:   First thing, by itself: - Levothyroxine 25 mcg - thyroid   Morning Pill Box Slot: - Carvedilol 3.125 mg daily - heart, blood pressure - Furosemide 80 mg - 2  Tablets - fluid pill  Evening Pill Box Slot: - Furosemide 40 mg - 1 tablet  - fluid pill  - Carvedilol 3.125 mg daily - heart, blood pressure - Pravastatin 40 mg daily  - cholesterol - Potassium 10 mEq  - Vitamin D - Vitamin B12   [name], PharmD [phone]  Visit Information  PATIENT GOALS: Goals Addressed              This Visit's Progress     Patient Stated   .  Medication Monitoring (pt-stated)        Patient Goals/Self-Care Activities . Over the next 90 days, patient will:  - take medications as prescribed focus on medication adherence by using weekly pill box Continue to collaborate with interdisciplinary team        Print copy of patient instructions, educational materials, and care plan provided in person.  Plan: Telephone follow up appointment with care management team member scheduled for:  ~ 1 day  [name], PharmD, [place], [place] Clinical Pharmacist [employer] [facility] [employer] [phone]

## 2021-02-03 NOTE — Progress Notes (Signed)
[name], male   DOB: 21-Oct-1945, 75 y.o.   MRN: [id number]   Subjective:    [name], male    DOB: [birth year], 75 y.o.   MRN: [id number]  HPI This visit occurred during the SARS-CoV-2 public health emergency.  Safety protocols were in place, including screening questions prior to the visit, additional usage of staff PPE, and extensive cleaning of exam room while observing appropriate contact time as indicated for disinfecting solutions.  Patient here for a scheduled follow up.  Follow regarding his blood sugar, blood pressure, cholesterol and to follow up on his living situation.  Social work has been involved with him and helping to arrange transportation to appts.  Also discussing and reiterating with him - regarding home safety, etc.  Also working with our pharmacist to try and clarify his medications.  Appears to not be taking some of his medication, specifically question if taking spironolactone and lisinopril.  Discussed the need to take his medications as prescribed.  No chest pain reported.  Breathing overall stable.  Some chronic sob with exertion, but feels is stable.  Eating.  Now has meals on wheels.  No nausea or vomiting.  No abdominal pain.  Bowels moving.  Had to remove bandage - left forearm.  Open laceration.  No evidence of infection.  Had been recently seeing ortho for thumb injury.  Released.  Followed by hematology - evaluated 01/2021 - f/u mantle cell lymphoma.  Now nearly three years removed from completing treatment.  Counts stable.  Dr Honor Junes has adjusted his basal insulin.  Has sensor.  Not having as many low sugars.  Discussed my concern with him driving.  Discussed falls and recent scan revealing rib fractures.  Denies any falls since last visit.  No headache.  No dizziness reported.  Discussed my concern regarding his living situation and desire for assisted living.  He declines at this time.    Past Medical History:  Diagnosis Date  . CHF  (congestive heart failure) ([place])   . Depression   . 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 Gastroparesis   . Graves disease   . Hypercholesterolemia   . Hypertension   . Mantle cell lymphoma ([place]) 07/2014  . Peripheral neuropathy   . Shingles    Past Surgical History:  Procedure Laterality Date  . CHOLECYSTECTOMY    . NEPHRECTOMY     right  . PACEMAKER INSERTION N/A 12/07/2017   Procedure: INSERTION PACEMAKER DUAL CHAMBER INITIAL INSERT;  Surgeon: Isaias Cowman, MD;  Location: [facility];  Service: Cardiovascular;  Laterality: N/A;  . PORTA CATH INSERTION N/A 11/03/2017   Procedure: PORTA CATH INSERTION;  Surgeon: Katha Cabal, MD;  Location: [facility];  Service: Cardiovascular;  Laterality: N/A;   Family History  Problem Relation Age of Onset  . Breast cancer Mother   . Diabetes Father   . Cancer Father        Lymphoma  . Alcohol abuse Maternal Uncle   . Diabetes Sister   . Heart disease Other        grandfather   Social History   Socioeconomic History  . Marital status: Widowed    Spouse name: Not on file  . Number of children: 1  . Years of education: 61  . Highest education level: 12th grade  Occupational History  . Occupation: Retired  Tobacco Use  . Smoking status: Former Research scientist (life sciences)  .  Smokeless tobacco: Current User    Types: Chew  Vaping Use  . Vaping Use: Never used  Substance and Sexual Activity  . Alcohol use: Yes    Alcohol/week: 3.0 standard drinks    Types: 3 Cans of beer per week    Comment: nightly  . Drug use: Never  . Sexual activity: Not Currently  Other Topics Concern  . Not on file  Social History Narrative  . Not on file   Social Determinants of Health   Financial Resource Strain: Low Risk   . Difficulty of Paying Living Expenses: Not very hard  Food Insecurity: No Food Insecurity  . Worried About Charity fundraiser in the Last Year: Never true  . Ran Out of  Food in the Last Year: Never true  Transportation Needs: Unmet Transportation Needs  . Lack of Transportation (Medical): Yes  . Lack of Transportation (Non-Medical): No  Physical Activity: Inactive  . Days of Exercise per Week: 0 days  . Minutes of Exercise per Session: 0 min  Stress: Stress Concern Present  . Feeling of Stress : To some extent  Social Connections: Socially Isolated  . Frequency of Communication with Friends and Family: More than three times a week  . Frequency of Social Gatherings with Friends and Family: Twice a week  . Attends Religious Services: Never  . Active Member of Clubs or Organizations: No  . Attends Archivist Meetings: Never  . Marital Status: Widowed    Outpatient Encounter Medications as of 02/03/2021  Medication Sig  . carvedilol (COREG) 3.125 MG tablet Take 1 tablet by mouth 2 (two) times daily with a meal.  . Cholecalciferol (VITAMIN D3) 50 MCG (2000 UT) CAPS Take by mouth.  . furosemide (LASIX) 40 MG tablet Take 40 mg by mouth 2 (two) times daily. 2 tablets in the morning 1 tablet in the afternoon as needed  . glucose 4 GM chewable tablet Chew 1 tablet (4 g total) by mouth once as needed for low blood sugar.  . insulin aspart (NOVOLOG) 100 UNIT/ML injection Basal rate 12am 0.8 units per hour, 9am 0.9 units per hour. (total basal insulin 20.7 units). Carbohydrate ratio 1 units for every 8gm of carbohydrate. Correction factor 1 units for every 60m/dl over target cbg. Target CBG 80-120  . potassium chloride (KLOR-CON) 10 MEQ tablet TAKE 1 TABLET BY MOUTH TWICE DAILY FOR 3 DAYS THEN CONTINUE ONCE DAILY  . pravastatin (PRAVACHOL) 40 MG tablet Take 40 mg by mouth daily.   . vitamin B-12 (CYANOCOBALAMIN) 1000 MCG tablet Take 1,000 mcg by mouth daily.  . [DISCONTINUED] gabapentin (NEURONTIN) 100 MG capsule Take 1 capsule (100 mg total) by mouth at bedtime. (Patient not taking: No sig reported)  . [DISCONTINUED] glucose 4 GM chewable tablet Chew 1  tablet by mouth once as needed for low blood sugar. (Patient not taking: Reported on 02/04/2021)  . [DISCONTINUED] nystatin cream (MYCOSTATIN) Apply 1 application topically 2 (two) times daily. (Patient not taking: Reported on 02/04/2021)   Facility-Administered Encounter Medications as of 02/03/2021  Medication  . heparin lock flush 100 unit/mL  . sodium chloride flush (NS) 0.9 % injection 10 mL  . sodium chloride flush (NS) 0.9 % injection 10 mL    Review of Systems  Constitutional: Negative for appetite change and unexpected weight change.  HENT: Negative for congestion and sinus pressure.   Respiratory: Negative for cough and chest tightness.        Breathing stable.   Cardiovascular: Negative  for chest pain and palpitations.       Followed by vascular surgery - legs wrapped - swelling improved.   Gastrointestinal: Negative for abdominal pain, diarrhea, nausea and vomiting.  Genitourinary: Negative for difficulty urinating and dysuria.  Musculoskeletal: Negative for joint swelling and myalgias.  Skin:       Open lacerating - left forearm.  No surrounding erythema.    Neurological: Negative for dizziness, light-headedness and headaches.  Psychiatric/Behavioral: Negative for agitation.       Still trying to cope with wife's passing.        Objective:    Physical Exam Vitals reviewed.  Constitutional:      General: He is not in acute distress.    Appearance: Normal appearance. He is well-developed.  HENT:     Head: Normocephalic and atraumatic.     Right Ear: External ear normal.     Left Ear: External ear normal.  Eyes:     General: No scleral icterus.       Right eye: No discharge.        Left eye: No discharge.     Conjunctiva/sclera: Conjunctivae normal.  Cardiovascular:     Rate and Rhythm: Normal rate and regular rhythm.  Pulmonary:     Effort: Pulmonary effort is normal. No respiratory distress.     Breath sounds: Normal breath sounds.  Abdominal:     General:  Bowel sounds are normal.     Palpations: Abdomen is soft.     Tenderness: There is no abdominal tenderness.  Musculoskeletal:     Cervical back: Neck supple. No tenderness.     Comments: Lower extremities wrapped.  Decreased swelling.   Lymphadenopathy:     Cervical: No cervical adenopathy.  Skin:    Findings: No rash.     Comments: Open laceration - left forearm. No surrounding erythema.  Dressing removed. Irrigated.  Redressed.    Neurological:     Mental Status: He is alert.  Psychiatric:        Mood and Affect: Mood normal.        Behavior: Behavior normal.     BP 122/60   Pulse 75   Temp (!) 97.4 F (36.3 C) (Oral)   Resp 16   Ht 5' 10"  (1.778 m)   Wt 236 lb (107 kg)   SpO2 98%   BMI 33.86 kg/m  Wt Readings from Last 3 Encounters:  02/05/21 235 lb (106.6 kg)  02/03/21 236 lb (107 kg)  01/30/21 236 lb (107 kg)     Lab Results  Component Value Date   WBC 6.1 07/29/2020   HGB 13.9 07/29/2020   HCT 40.0 07/29/2020   PLT 102 (L) 07/29/2020   GLUCOSE 149 (H) 01/24/2021   CHOL 132 06/24/2020   TRIG 61.0 06/24/2020   HDL 51.50 06/24/2020   LDLCALC 68 06/24/2020   ALT 16 07/29/2020   AST 24 07/29/2020   NA 138 01/24/2021   K 3.4 (L) 01/24/2021   CL 102 01/24/2021   CREATININE 1.19 01/24/2021   BUN 14 01/24/2021   CO2 26 01/24/2021   TSH 0.94 10/24/2020   INR 1.12 07/14/2018   HGBA1C 7.0 01/23/2021   MICROALBUR 1.9 09/22/2016    CT Abdomen Pelvis Wo Contrast  Result Date: 01/25/2021 CLINICAL DATA:  Restaging mantle cell lymphoma EXAM: CT CHEST, ABDOMEN AND PELVIS WITHOUT CONTRAST TECHNIQUE: Multidetector CT imaging of the chest, abdomen and pelvis was performed following the standard protocol without IV contrast. COMPARISON:  07/29/2020  FINDINGS: CT CHEST FINDINGS Cardiovascular: Right Port-A-Cath tip: SVC. Dual lead pacer noted with proximal and distal lead tips in the right atrium and ventricle, respectively. Left anterior descending and right coronary  artery atherosclerotic calcification. Mediastinum/Nodes: No pathologic adenopathy in the chest identified. Lungs/Pleura: Mild lingular scarring. Mild scarring in the posterior basal segment right lower lobe. Musculoskeletal: Various old rib fractures are present. However, there is subacute fractures of the right fourth, fifth, sixth, and seventh ribs laterally which were not present on 07/19/2020. Chronic deformity of the right proximal humerus. Thoracic kyphosis. CT ABDOMEN PELVIS FINDINGS Hepatobiliary: Cholecystectomy.  Otherwise unremarkable. Pancreas: Unremarkable Spleen: Unremarkable.  No splenomegaly. Adrenals/Urinary Tract: Absent right kidney. Both adrenal glands appear unremarkable. Left kidney unremarkable. Stomach/Bowel: Unremarkable Vascular/Lymphatic: Aortoiliac atherosclerotic vascular disease. Left common iliac node 1.0 cm in short axis on image 88 series 2, previously the same by my measurements. Right common iliac node 0.8 cm in short axis on image 95 series 2, previously the same by my measurements. Reproductive: Mild prostatomegaly. Other: No supplemental non-categorized findings. Musculoskeletal: Moderate degenerative hip arthropathy bilaterally. IMPRESSION: 1. No overtly pathologic adenopathy in the chest, abdomen, or pelvis, although there is a borderline enlarged but stable left common iliac node 1.0 cm in short axis. No splenomegaly 2. Subacute interval fractures of the right fourth, fifth, sixth, and seventh ribs. 3. Other imaging findings of potential clinical significance: Aortic Atherosclerosis (ICD10-I70.0). Coronary atherosclerosis. Chronic deformity of the right proximal humerus. Mild prostatomegaly. Absent right kidney. Electronically Signed   By: Van Clines M.D.   On: 01/25/2021 17:57   CT Chest Wo Contrast  Result Date: 01/25/2021 CLINICAL DATA:  Restaging mantle cell lymphoma EXAM: CT CHEST, ABDOMEN AND PELVIS WITHOUT CONTRAST TECHNIQUE: Multidetector CT imaging of the  chest, abdomen and pelvis was performed following the standard protocol without IV contrast. COMPARISON:  07/29/2020 FINDINGS: CT CHEST FINDINGS Cardiovascular: Right Port-A-Cath tip: SVC. Dual lead pacer noted with proximal and distal lead tips in the right atrium and ventricle, respectively. Left anterior descending and right coronary artery atherosclerotic calcification. Mediastinum/Nodes: No pathologic adenopathy in the chest identified. Lungs/Pleura: Mild lingular scarring. Mild scarring in the posterior basal segment right lower lobe. Musculoskeletal: Various old rib fractures are present. However, there is subacute fractures of the right fourth, fifth, sixth, and seventh ribs laterally which were not present on 07/19/2020. Chronic deformity of the right proximal humerus. Thoracic kyphosis. CT ABDOMEN PELVIS FINDINGS Hepatobiliary: Cholecystectomy.  Otherwise unremarkable. Pancreas: Unremarkable Spleen: Unremarkable.  No splenomegaly. Adrenals/Urinary Tract: Absent right kidney. Both adrenal glands appear unremarkable. Left kidney unremarkable. Stomach/Bowel: Unremarkable Vascular/Lymphatic: Aortoiliac atherosclerotic vascular disease. Left common iliac node 1.0 cm in short axis on image 88 series 2, previously the same by my measurements. Right common iliac node 0.8 cm in short axis on image 95 series 2, previously the same by my measurements. Reproductive: Mild prostatomegaly. Other: No supplemental non-categorized findings. Musculoskeletal: Moderate degenerative hip arthropathy bilaterally. IMPRESSION: 1. No overtly pathologic adenopathy in the chest, abdomen, or pelvis, although there is a borderline enlarged but stable left common iliac node 1.0 cm in short axis. No splenomegaly 2. Subacute interval fractures of the right fourth, fifth, sixth, and seventh ribs. 3. Other imaging findings of potential clinical significance: Aortic Atherosclerosis (ICD10-I70.0). Coronary atherosclerosis. Chronic deformity of  the right proximal humerus. Mild prostatomegaly. Absent right kidney. Electronically Signed   By: Van Clines M.D.   On: 01/25/2021 17:57       Assessment & Plan:   Problem List Items Addressed This  Visit    Arm laceration    Dressing removed.  Irrigated.  No evidence of infection.  Topical abx ointment applied.  Redressed.  Supplies given.  Schedule soon f/u to reassess.        Cardiomyopathy, idiopathic (HCC)    On lasix.  Appears to be off spironolactone.  Again, will clarify medications with our pharmacist.  Instructed to bring all medications with him to next visit.       Chronic systolic CHF (congestive heart failure) (HCC)    On lasix.  Breathing stable.  Weight stable.  Apparently off spironolactone.  Plans to clarify medications with pharmacist.  Follow low sodium diet.  Follow weight. Discussed the need to weigh self.        CKD (chronic kidney disease) stage 3, GFR 30-59 ml/min (HCC)    Recent improvement in kidney function.  Continue to avoid antiinflammatories.  Continue f/u with nephrology.       Diabetes [facility])    Being followed by endocrinology.  Has sensor now.  Not having as many low sugars.  Dr Honor Junes has adjusted his basal insulin rate.  Discussed low carb diet.  Stay active.  Follow met b and a1c.       Relevant Medications   glucose 4 GM chewable tablet   Essential hypertension, benign    On coreg and lasix.  Unclear exact medications he is taking.  Planning to f/u with Catie (pharmacist) to clarify.  Follow pressures.  Follow metabolic panel.        Hypercholesterolemia    Continue pravastatin.  Low cholesterol diet and exercise.  Follow lipid panel and liver function tests.       Iron deficiency anemia    Follow cbc and iron studies.       Lower limb ulcer, calf ([place])    Being followed by vascular surgery.  Legs being wrapped regularly.        Lymphedema    Continue regular wraps.       Mantle cell lymphoma ([place])    Now three years  post treatment.  Doing well.  Continue f/u with oncology.  Just recently evaluated.        Pancytopenia ([place])    Followed by oncology.       Sick sinus syndrome [facility])    S/p pacemaker placement.  Followed by cardiology.       Stress    Still trying to cope with wife's passing.  Follow.       Thyroid disease    Followed by endocrinology.           [name], MD

## 2021-02-03 NOTE — Progress Notes (Signed)
Subjective:    [name], male    DOB: 1946-06-01, 75 y.o.   MRN: [id number] Chief Complaint  Patient presents with  . Follow-up    4wk unna boot check    Gary [name] is a 75 year old male that presents today for evaluation of lower extremity edema following Unna wraps.  The patient is doing well thus far.  The swelling is well controlled and there are no new ulcerations.  Patient has developed some small blisters on his toes.  This is likely a result of running her issues.  He denies any more recent syncopal events. He denies any fever or chills    Review of Systems  Cardiovascular: Positive for leg swelling.  Skin: Positive for wound.       Objective:   Physical Exam Vitals reviewed.  HENT:     Head: Normocephalic.  Cardiovascular:     Rate and Rhythm: Normal rate.     Pulses: Decreased pulses.  Pulmonary:     Effort: Pulmonary effort is normal.  Neurological:     Mental Status: He is alert and oriented to person, place, and time.  Psychiatric:        Mood and Affect: Mood normal.        Behavior: Behavior normal.        Thought Content: Thought content normal.        Judgment: Judgment normal.     BP 132/67 (BP Location: Right Arm)   Pulse 68   Resp 16   Wt 232 lb (105.2 kg)   BMI 33.29 kg/m   Past Medical History:  Diagnosis Date  . CHF (congestive heart failure) ([place])   . Depression   . 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 Gastroparesis   . Graves disease   . Hypercholesterolemia   . Hypertension   . Mantle cell lymphoma ([place]) 07/2014  . Peripheral neuropathy   . Shingles     Social History   Socioeconomic History  . Marital status: Widowed    Spouse name: Not on file  . Number of children: 1  . Years of education: 71  . Highest education level: 12th grade  Occupational History  . Occupation: Retired  Tobacco Use  . Smoking status: Former Research scientist (life sciences)  . Smokeless tobacco:  Current User    Types: Chew  Vaping Use  . Vaping Use: Never used  Substance and Sexual Activity  . Alcohol use: Yes    Alcohol/week: 3.0 standard drinks    Types: 3 Cans of beer per week    Comment: nightly  . Drug use: Never  . Sexual activity: Not Currently  Other Topics Concern  . Not on file  Social History Narrative  . Not on file   Social Determinants of Health   Financial Resource Strain: Low Risk   . Difficulty of Paying Living Expenses: Not hard at all  Food Insecurity: No Food Insecurity  . Worried About Charity fundraiser in the Last Year: Never true  . Ran Out of Food in the Last Year: Never true  Transportation Needs: Unmet Transportation Needs  . Lack of Transportation (Medical): Yes  . Lack of Transportation (Non-Medical): No  Physical Activity: Inactive  . Days of Exercise per Week: 0 days  . Minutes of Exercise per Session: 0 min  Stress: Stress Concern Present  . Feeling of Stress : To some extent  Social Connections: Socially Isolated  .  Frequency of Communication with Friends and Family: More than three times a week  . Frequency of Social Gatherings with Friends and Family: Twice a week  . Attends Religious Services: Never  . Active Member of Clubs or Organizations: No  . Attends Archivist Meetings: Never  . Marital Status: Widowed  Intimate Partner Violence: Not At Risk  . Fear of Current or Ex-Partner: No  . Emotionally Abused: No  . Physically Abused: No  . Sexually Abused: No    Past Surgical History:  Procedure Laterality Date  . CHOLECYSTECTOMY    . NEPHRECTOMY     right  . PACEMAKER INSERTION N/A 12/07/2017   Procedure: INSERTION PACEMAKER DUAL CHAMBER INITIAL INSERT;  Surgeon: Isaias Cowman, MD;  Location: [facility]S;  Service: Cardiovascular;  Laterality: N/A;  . PORTA CATH INSERTION N/A 11/03/2017   Procedure: PORTA CATH INSERTION;  Surgeon: Katha Cabal, MD;  Location: [facility];  Service:  Cardiovascular;  Laterality: N/A;    Family History  Problem Relation Age of Onset  . Breast cancer Mother   . Diabetes Father   . Cancer Father        Lymphoma  . Alcohol abuse Maternal Uncle   . Diabetes Sister   . Heart disease Other        grandfather    Allergies  Allergen Reactions  . Rofecoxib Nausea Only    CBC Latest Ref Rng & Units 07/29/2020 06/24/2020 01/24/2020  WBC 4.0 - 10.5 K/uL 6.1 6.3 5.7  Hemoglobin 13.0 - 17.0 g/dL 13.9 13.1 13.5  Hematocrit 39.0 - 52.0 % 40.0 39.0 40.8  Platelets 150 - 400 K/uL 102(L) 131.0(L) 118(L)      CMP     Component Value Date/Time   NA 138 01/24/2021 0830   NA 138 07/15/2014 0515   K 3.4 (L) 01/24/2021 0830   K 3.5 01/16/2015 1429   CL 102 01/24/2021 0830   CL 110 (H) 07/15/2014 0515   CO2 26 01/24/2021 0830   CO2 24 07/15/2014 0515   GLUCOSE 149 (H) 01/24/2021 0830   GLUCOSE 145 (H) 07/15/2014 0515   BUN 14 01/24/2021 0830   BUN 17 07/15/2014 0515   CREATININE 1.19 01/24/2021 0830   CREATININE 0.89 01/16/2015 1429   CALCIUM 7.9 (L) 01/24/2021 0830   CALCIUM 7.1 (L) 07/15/2014 0515   PROT 7.1 07/29/2020 0952   PROT 6.2 (L) 10/22/2014 1000   ALBUMIN 3.4 (L) 07/29/2020 0952   ALBUMIN 2.6 (L) 10/22/2014 1000   AST 24 07/29/2020 0952   AST 19 10/22/2014 1000   ALT 16 07/29/2020 0952   ALT 21 10/22/2014 1000   ALKPHOS 115 07/29/2020 0952   ALKPHOS 118 (H) 10/22/2014 1000   BILITOT 1.1 07/29/2020 0952   BILITOT 0.8 10/22/2014 1000   GFRNONAA >60 01/24/2021 0830   GFRNONAA >60 01/16/2015 1429   GFRAA 41 (L) 01/24/2020 0925   GFRAA >60 01/16/2015 1429     No results found.     Assessment & Plan:   1. Lymphedema  The swelling is well controlled and no significant new ulceration seen. The patient did have a significant amount of dried skin over his lower extremities. The majority of this was debrided in office. He tolerated this well. The patient will continue in [place] wraps to be changed on a weekly basis.  We will have the patient return to the office in 4 weeks for evaluation.   2. Hypercholesterolemia Continue statin as ordered and reviewed, no  changes at this time   3. Essential hypertension, benign Continue antihypertensive medications as already ordered, these medications have been reviewed and there are no changes at this time.    Current Outpatient Medications on File Prior to Visit  Medication Sig Dispense Refill  . carvedilol (COREG) 3.125 MG tablet Take 1 tablet by mouth 2 (two) times daily with a meal.    . Cholecalciferol (VITAMIN D3) 50 MCG (2000 UT) CAPS Take by mouth.    . furosemide (LASIX) 40 MG tablet Take 40 mg by mouth 2 (two) times daily. 2 tablets in the morning 1 tablet in the afternoon as needed    . gabapentin (NEURONTIN) 100 MG capsule Take 1 capsule (100 mg total) by mouth at bedtime. (Patient not taking: Reported on 01/30/2021) 30 capsule 2  . glucose 4 GM chewable tablet Chew 1 tablet by mouth once as needed for low blood sugar.    . insulin aspart (NOVOLOG) 100 UNIT/ML injection Basal rate 12am 0.8 units per hour, 9am 0.9 units per hour. (total basal insulin 20.7 units). Carbohydrate ratio 1 units for every 8gm of carbohydrate. Correction factor 1 units for every 50mg /dl over target cbg. Target CBG 80-120 1 vial 12  . nystatin cream (MYCOSTATIN) Apply 1 application topically 2 (two) times daily. 30 g 0  . potassium chloride (KLOR-CON) 10 MEQ tablet TAKE 1 TABLET BY MOUTH TWICE DAILY FOR 3 DAYS THEN CONTINUE ONCE DAILY 30 tablet 0  . pravastatin (PRAVACHOL) 40 MG tablet Take 40 mg by mouth daily.     . vitamin B-12 (CYANOCOBALAMIN) 1000 MCG tablet Take 1,000 mcg by mouth daily.     Current Facility-Administered Medications on File Prior to Visit  Medication Dose Route Frequency Provider Last Rate Last Admin  . heparin lock flush 100 unit/mL  500 Units Intravenous Once Lloyd Huger, MD      . sodium chloride flush (NS) 0.9 % injection 10 mL  10 mL  Intravenous PRN Lloyd Huger, MD   10 mL at 02/01/18 0829  . sodium chloride flush (NS) 0.9 % injection 10 mL  10 mL Intravenous PRN Lloyd Huger, MD   10 mL at 04/05/18 0800    There are no Patient Instructions on file for this visit. No follow-ups on file.   Kris Hartmann, NP
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
## 2021-02-04 ENCOUNTER — Ambulatory Visit: Payer: Medicare Other | Admitting: Pharmacist

## 2021-02-04 DIAGNOSIS — I428 Other cardiomyopathies: Secondary | ICD-10-CM

## 2021-02-04 DIAGNOSIS — I5022 Chronic systolic (congestive) heart failure: Secondary | ICD-10-CM | POA: Diagnosis not present

## 2021-02-04 DIAGNOSIS — E10649 Type 1 diabetes mellitus with hypoglycemia without coma: Secondary | ICD-10-CM

## 2021-02-04 DIAGNOSIS — F4321 Adjustment disorder with depressed mood: Secondary | ICD-10-CM

## 2021-02-04 DIAGNOSIS — I10 Essential (primary) hypertension: Secondary | ICD-10-CM | POA: Diagnosis not present

## 2021-02-04 DIAGNOSIS — I429 Cardiomyopathy, unspecified: Secondary | ICD-10-CM

## 2021-02-04 DIAGNOSIS — E1151 Type 2 diabetes mellitus with diabetic peripheral angiopathy without gangrene: Secondary | ICD-10-CM | POA: Diagnosis not present

## 2021-02-04 NOTE — Patient Instructions (Addendum)
Gary [name],   Please fill your twice daily pill box as follows:   Please think about if you want to see about [facility] or one of the other local pharmacies.   Please FIND YOUR SCALE AND WEIGH YOURSELF EVERY DAY. Write that down in your blue book.   Please CALL ME when other providers start new medications and I can update your list as below.   Here is how I want you take your medications:   First thing, by itself: - Levothyroxine 25 mcg - thyroid   Morning Pill Box Slot: - Carvedilol 3.125 mg daily - heart, blood pressure - Furosemide 80 mg - 2  Tablets - fluid pill  Evening Pill Box Slot: - Furosemide 40 mg - 1 tablet  - fluid pill  - Carvedilol 3.125 mg daily - heart, blood pressure - Pravastatin 40 mg daily  - cholesterol - Potassium 10 mEq  - Vitamin D - Vitamin B12  - Iron supplement  [name], PharmD [phone]  Visit Information  PATIENT GOALS: Goals Addressed              This Visit's Progress     Patient Stated   .  Medication Monitoring (pt-stated)        Patient Goals/Self-Care Activities . Over the next 90 days, patient will:  - take medications as prescribed focus on medication adherence by using weekly pill box Continue to collaborate with interdisciplinary team       The patient verbalized understanding of instructions, educational materials, and care plan provided today and agreed to receive a mailed copy of patient instructions, educational materials, and care plan.    Plan: Telephone follow up appointment with care management team member scheduled for:  ~ 3 weeks  [name], PharmD, [place], [place] Clinical Pharmacist [employer] at [employer] [phone]

## 2021-02-04 NOTE — Chronic Care Management (AMB) (Signed)
Chronic Care Management Pharmacy Note  02/04/2021 Name:  Gary [name] MRN:  [id number] DOB:  August 17, 1946  Subjective: Gary [name] is an 75 y.o. year old male who is a primary patient of [name], MD.  The CCM team was consulted for assistance with disease management and care coordination needs.    Engaged with patient by telephone for follow up visit in response to provider referral for pharmacy case management and/or care coordination services.   Consent to Services:  The patient was given information about Chronic Care Management services, agreed to services, and gave verbal consent prior to initiation of services.  Please see initial visit note for detailed documentation.   Patient Care Team: [name], MD as PCP - General (Internal Medicine) Lloyd Huger, MD as Consulting Physician (Oncology) Lucky Cowboy Erskine Squibb, MD as Referring Physician (Vascular Surgery) Schnier, Dolores Lory, MD (Vascular Surgery) Corey Skains, MD as Consulting Physician (Cardiology) De Hollingshead, RPH-[name] (Pharmacist) Leona Singleton, RN as Case Manager Saporito, Maree Erie, LCSW as Social Worker (Licensed Clinical Social Worker)  Recent office visits: None since our last visit  Recent consult visits: None since our [place] visit  Hospital visits: None in previous 6 months  Objective:  Lab Results  Component Value Date   CREATININE 1.19 01/24/2021   CREATININE 1.40 10/24/2020   CREATININE 1.78 (H) 08/06/2020    Lab Results  Component Value Date   HGBA1C 7.0 01/23/2021   Last diabetic Eye exam:  Lab Results  Component Value Date/Time   HMDIABEYEEXA No Retinopathy 03/20/2020 12:00 AM    Last diabetic Foot exam:  Lab Results  Component Value Date/Time   HMDIABFOOTEX see my exam.  07/10/2019 12:00 AM        Component Value Date/Time   CHOL 132 06/24/2020 1448   CHOL 82 06/14/2014 0404   TRIG 61.0 06/24/2020 1448   TRIG 34 06/14/2014 0404   HDL 51.50 06/24/2020  1448   HDL 38 (L) 06/14/2014 0404   CHOLHDL 3 06/24/2020 1448   VLDL 12.2 06/24/2020 1448   VLDL 7 06/14/2014 0404   LDLCALC 68 06/24/2020 1448   LDLCALC 37 06/14/2014 0404    Hepatic Function Latest Ref Rng & Units 07/29/2020 06/24/2020 01/24/2020  Total Protein 6.5 - 8.1 g/dL 7.1 6.6 6.4(L)  Albumin 3.5 - 5.0 g/dL 3.4(L) 3.7 3.2(L)  AST 15 - 41 U/L _0 ALT 0 - 44 U/L _1 Alk Phosphatase 38 - 126 U/L 115 141(H) 103  Total Bilirubin 0.3 - 1.2 mg/dL 1.1 0.4 0.7  Bilirubin, Direct 0.0 - 0.3 mg/dL - 0.1 -    Lab Results  Component Value Date/Time   TSH 0.94 10/24/2020 02:55 PM   TSH 0.98 06/24/2020 02:48 PM    CBC Latest Ref Rng & Units 07/29/2020 06/24/2020 01/24/2020  WBC 4.0 - 10.5 K/uL 6.1 6.3 5.7  Hemoglobin 13.0 - 17.0 g/dL 13.9 13.1 13.5  Hematocrit 39.0 - 52.0 % 40.0 39.0 40.8  Platelets 150 - 400 K/uL 102(L) 131.0(L) 118(L)    No results found for: VD25OH  Clinical ASCVD: No  The 10-year ASCVD risk score Mikey Bussing DC Jr., et al., 2013) is: 32.4%   Values used to calculate the score:     Age: 80 years     Sex: Male     Is Non-Hispanic African American: No     Diabetic: Yes     Tobacco smoker: No     Systolic Blood  Pressure: 122 mmHg     Is BP treated: Yes     HDL Cholesterol: 51.5 mg/dL     Total Cholesterol: 132 mg/dL     Social History   Tobacco Use  Smoking Status Former Smoker  Smokeless Tobacco Current User  . Types: Chew   BP Readings from Last 3 Encounters:  02/03/21 122/60  01/30/21 117/64  01/29/21 132/67   Pulse Readings from Last 3 Encounters:  02/03/21 75  01/30/21 71  01/29/21 68   Wt Readings from Last 3 Encounters:  02/03/21 236 lb (107 kg)  01/30/21 236 lb (107 kg)  01/29/21 232 lb (105.2 kg)    Assessment: Review of patient past medical history, allergies, medications, health status, including review of consultants reports, laboratory and other test data, was performed as part of comprehensive evaluation and provision of  chronic care management services.   SDOH:  (Social Determinants of Health) assessments and interventions performed:  SDOH Interventions   Flowsheet Row Most Recent Value  SDOH Interventions   Financial Strain Interventions Intervention Not Indicated      CCM Care Plan  Allergies  Allergen Reactions  . Rofecoxib Nausea Only    Medications Reviewed Today    Reviewed by De Hollingshead, RPH-[name] (Pharmacist) on 02/04/21 at 1405  Med List Status: <None>  Medication Order Taking? Sig Documenting Provider Last Dose Status Informant  carvedilol (COREG) 3.125 MG tablet [id number] Yes Take 1 tablet by mouth 2 (two) times daily with a meal. [provider] Taking Active   Cholecalciferol (VITAMIN D3) 50 MCG (2000 UT) CAPS [id number] Yes Take by mouth. [provider] Taking Active   ferrous sulfate 325 (65 FE) MG tablet [id number] Yes Take 325 mg by mouth daily with breakfast. [provider] Taking Active   furosemide (LASIX) 40 MG tablet [id number] Yes Take 40 mg by mouth 2 (two) times daily. 2 tablets in the morning 1 tablet in the afternoon as needed [provider] Taking Active Other  glucose 4 GM chewable tablet [id number] No Chew 1 tablet by mouth once as needed for low blood sugar.  Patient not taking: Reported on 02/04/2021   [provider] Not Taking Active Other  insulin aspart (NOVOLOG) 100 UNIT/ML injection [id number] Yes Basal rate 12am 0.8 units per hour, 9am 0.9 units per hour. (total basal insulin 20.7 units). Carbohydrate ratio 1 units for every 8gm of carbohydrate. Correction factor 1 units for every 42m/dl over target cbg. Target CBG 80-120 WLoletha Grayer MD Taking Active Other  levothyroxine (SYNTHROID) 25 MCG tablet [id number] Take 25 mcg by mouth daily before breakfast. [provider] Taking Active   nystatin cream (MYCOSTATIN) [id number] Apply 1 application topically 2 (two) times daily.  Patient not taking:  Reported on 02/04/2021   SEinar Pheasant MD Not Taking Active   potassium chloride (KLOR-CON) 10 MEQ tablet [id number] TAKE 1 TABLET BY MOUTH TWICE DAILY FOR 3 DAYS THEN CONTINUE ONCE DAILY SEinar Pheasant MD Taking Active   pravastatin (PRAVACHOL) 40 MG tablet [id number] Take 40 mg by mouth daily.  [provider] Taking Active Other  vitamin B-12 (CYANOCOBALAMIN) 1000 MCG tablet [id number] Take 1,000 mcg by mouth daily. [provider] Taking Active           Patient Active Problem List   Diagnosis Date Noted  . Hypokalemia 10/24/2020  . Injury by nail 08/15/2020  . Shingles 07/24/2020  . Rash 07/02/2020  . Colon cancer screening 07/02/2020  .  Sick sinus syndrome ([place]) 12/28/2019  . Acquired absence of kidney 09/13/2019  . Edema of lower extremity 09/13/2019  . 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 Proteinuria 09/13/2019  . Lower extremity pain, bilateral   . Diabetic foot ulcer associated with type 1 diabetes mellitus ([place])   . Acute on chronic systolic CHF (congestive heart failure) ([place])   . Cellulitis 08/18/2019  . Lower limb ulcer, calf ([place]) 08/01/2019  . Left hip pain 07/16/2019  . Pain due to onychomycosis of toenails of both feet 04/03/2019  . Swelling of limb 02/07/2019  . Pancytopenia ([place]) 12/03/2018  . CKD (chronic kidney disease) stage 3, GFR 30-59 ml/min (HCC) 09/16/2018  . Left shoulder pain 09/16/2018  . Chronic cholecystitis 12/29/2017  . Graves disease 12/29/2017  . Peripheral neuropathy 12/29/2017  . S/p nephrectomy 12/29/2017  . 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 Symptomatic bradycardia 12/02/2017  . AKI (acute kidney injury) ([place]) 12/02/2017  . Chronic systolic CHF (congestive heart failure) ([place]) 12/02/2017  . Saturday night paralysis 11/12/2017  . Hypoglycemia 10/28/2017  . Goals of care, counseling/discussion 10/24/2017  . Lymphedema  10/20/2017  . Iron deficiency anemia 09/11/2017  . Fall 08/13/2017  . Humerus fracture 08/13/2017  . Anemia 01/12/2017  . Bilateral carotid artery stenosis 06/29/2016  . Hand laceration 12/16/2015  . Adjustment disorder with depressed mood 08/11/2015  . Cardiomyopathy, idiopathic ([place]) 04/22/2015  . CAD in native artery 11/02/2014  . Mantle cell lymphoma ([place]) 08/03/2014  . History of colonic polyps 04/29/2014  . Irregular heart beat 04/22/2014  . SOB (shortness of breath) 04/22/2014  . Stress 03/21/2014  . PVC (premature ventricular contraction) 02/21/2014  . GERD (gastroesophageal reflux disease) 10/23/2013  . Gastroparesis 06/25/2013  . Chest pain 04/13/2013  . Thyroid disease 04/13/2013  . Diabetes ([place]) 04/13/2013  . Essential hypertension, benign 04/13/2013  . Hypercholesterolemia 04/13/2013    Immunization History  Administered Date(s) Administered  . Fluad Quad(high Dose 65+) 07/10/2019, 06/24/2020  . Influenza Split 07/13/2014  . Influenza, High Dose Seasonal PF 10/22/2016, 10/21/2017, 09/14/2018  . Influenza, Seasonal, Injecte, Preservative Fre 08/27/2008  . Influenza,inj,Quad PF,6+ Mos 06/20/2013, 08/09/2015  . Influenza-Unspecified 06/20/2013, 08/09/2015  . Pneumococcal Conjugate-13 12/26/2014  . Pneumococcal Polysaccharide-23 10/22/2017  . Tdap 07/14/2018    Conditions to be addressed/monitored: CHF, HLD and T1DM, CKD  Care Plan : Medication Management  Updates made by De Hollingshead, RPH-[name] since 02/04/2021 12:00 AM    Problem: T1DM, CKD, HF     Long-Range Goal: Disease Progression Prevention   Start Date: 12/13/2020  This Visit's Progress: On track  Recent Progress: On track  Priority: High  Note:   Current Barriers:  . Unable to achieve control of diabetes  . Complex patient with multiple comorbidities at risk for exacerbation  Pharmacist Clinical Goal(s):  Marland Kitchen Over the next 90 days, patient will achieve adherence to monitoring guidelines and  medication adherence to achieve therapeutic efficacy through collaboration with PharmD and provider.   Interventions: . 1:1 collaboration with [name], MD regarding development and update of comprehensive plan of care as evidenced by provider attestation and co-signature . Inter-disciplinary care team collaboration (see longitudinal plan of care) . Comprehensive medication review performed; medication list updated in electronic medical record  SDOH: . Receiving meals on wheels, transportation benefits. . Denies medication cost concerns. D/t pump, insulin running under Part B.  Health Maintenance: . Previously had extensive discussion recommending COVID vaccination. Patient plans  to consider . Discussed use of a pill box. Patient reports that he has a BID pill box at home. Notes he will try to find and fill. Refuses to transfer to another pharmacy that offers adherence packaging or to consider [facility], as he does not want to wait on medications to be mailed from the pharmacy. This is how I advised him to fill pill box: o AM separately - levothyroxine 25 mcg o AM pill box slot - furosemide 80 mg, carvedilol 3.125 mg o PM pill box slot - carvedilol 3.125 mg, furosemide 40 mg, pravastatin 40 mg, Vitamin D, Vitamin B12, ferrous sulfate o Will fax updated medication list and above information to Drs Lawrence Santiago for updating in their system. [facility] and Dr. Keturah Barre notes still mention use of lisinopril (Dr. Candiss Norse) and spironolactone [facility]). Advised patient to also take an updated medication list, if not his pill bottles, with him to upcoming provider appointments.   Diabetes: . A1c controlled but extremely fluctuant; current treatment: Novolog per [employer]tronic 630G pump; follows w/ Dr. Honor Junes. Plan to change pump when warranty expires 11/23 per Dr. Sherren Mocha documentation . Current glucose readings: using DexCom CGM. Received new reader from the  company (old one wasn't charging), but he has not started using it yet. Will use new reader next time he starts a sensor. . Improvement in hypoglycemia recently with recent insulin dose adjustments.  . Will investigate when new Dexcom reader can be started and will review with patient tomorrow at f/u call  HFrEF, CKD: . Controlled; current treatment: carvedilol 3.125 mg BID, furosemide 80 mg QAM, 40 mg QPM, potassium 10 mEq daily o Lisinopril previously removed d/t soft BP (per cardiology) o Patient stopped spironolactone due to significant dehydration/symptoms of hypotension.  . Current home readings: not checking at home. Reports that since he has blood pressure checked weekly at [facility], he does not feel a need to check at home.  Marland Kitchen Not weighing at home. Has been educated to do so per RN CM. Does not know where his scale is, notes that he needs to find it. . Dr. Keturah Barre documentation notes that patient is still on RAAS agent. Fill history does not support this.   . Reviewed and confirmed that patient is taking carvedilol 3.125 mg BID, furosemide 80 mg QAM, 40 mg QPM, and potassium 10 mEq daily. Will fax updated list to his other providers.   Hyperlipidemia: . Controlled per last lipid panel; current treatment: pravastatin 40 mg daily . Confirmed that patient is taking this . Recommended to continue current regimen at this time  Hypothyroidism: . Controlled per last lab work; current regimen: per fill history and Dr. Sherren Mocha documentation, patient should be taking levothyroxine 25 mcg daily . Patient may have not been taking for a few weeks, but found his bottle today and plans to restart today.   Supplements: Marland Kitchen Vitamin D, Vitamin B12  Patient Goals/Self-Care Activities . Over the next 90 days, patient will:  - take medications as prescribed focus on medication adherence by using weekly pill box Continue to collaborate with interdisciplinary team  Follow Up Plan: Telephone follow up  appointment with care management team member scheduled for: ~3 weeks     Medication Assistance: None required.  Patient affirms current coverage meets needs.  [id number] [address] [place] [id number]: [phone]: [phone]  Follow Up:  Patient agrees to  Care Plan and Follow-up.  Plan: Telephone follow up appointment with care management team member scheduled for:  ~ 3 weeks  [name], PharmD, [place], [place] Clinical Pharmacist [employer] [facility] [employer] [phone]

## 2021-02-05 ENCOUNTER — Other Ambulatory Visit: Payer: Self-pay

## 2021-02-05 ENCOUNTER — Ambulatory Visit (INDEPENDENT_AMBULATORY_CARE_PROVIDER_SITE_OTHER): Payer: Medicare Other | Admitting: Nurse Practitioner

## 2021-02-05 VITALS — BP 115/69 | HR 60 | Ht 70.0 in | Wt 235.0 lb

## 2021-02-05 DIAGNOSIS — I890 Lymphedema, not elsewhere classified: Secondary | ICD-10-CM | POA: Diagnosis not present

## 2021-02-05 NOTE — Progress Notes (Signed)
History of Present Illness  There is no documented history at this time  Assessments & Plan   There are no diagnoses linked to this encounter.    Additional instructions  Subjective:  Patient presents with venous ulcer of the Bilateral lower extremity.    Procedure:  3 layer unna wrap was placed Bilateral lower extremity.   Plan:   Follow up in one week.  

## 2021-02-08 ENCOUNTER — Other Ambulatory Visit: Payer: Self-pay | Admitting: Internal Medicine

## 2021-02-09 ENCOUNTER — Encounter (INDEPENDENT_AMBULATORY_CARE_PROVIDER_SITE_OTHER): Payer: Self-pay | Admitting: Nurse Practitioner

## 2021-02-09 ENCOUNTER — Encounter: Payer: Self-pay | Admitting: Internal Medicine

## 2021-02-09 DIAGNOSIS — S41119A Laceration without foreign body of unspecified upper arm, initial encounter: Secondary | ICD-10-CM | POA: Insufficient documentation

## 2021-02-09 MED ORDER — GLUCOSE 4 G PO CHEW: 1.0000 | CHEWABLE_TABLET | Freq: Once | ORAL | 0 refills | Status: DC | PRN

## 2021-02-09 NOTE — Assessment & Plan Note (Signed)
On lasix.  Breathing stable.  Weight stable.  Apparently off spironolactone.  Plans to clarify medications with pharmacist.  Follow low sodium diet.  Follow weight. Discussed the need to weigh self.

## 2021-02-09 NOTE — Assessment & Plan Note (Signed)
On lasix.  Appears to be off spironolactone.  Again, will clarify medications with our pharmacist.  Instructed to bring all medications with him to next visit.

## 2021-02-09 NOTE — Assessment & Plan Note (Signed)
Followed by endocrinology 

## 2021-02-09 NOTE — Assessment & Plan Note (Signed)
Followed by oncology 

## 2021-02-09 NOTE — Assessment & Plan Note (Signed)
Continue regular wraps.

## 2021-02-09 NOTE — Assessment & Plan Note (Signed)
Recent improvement in kidney function.  Continue to avoid antiinflammatories.  Continue f/u with nephrology.

## 2021-02-09 NOTE — Assessment & Plan Note (Signed)
Being followed by endocrinology.  Has sensor now.  Not having as many low sugars.  Dr Honor Junes has adjusted his basal insulin rate.  Discussed low carb diet.  Stay active.  Follow met b and a1c.

## 2021-02-09 NOTE — Assessment & Plan Note (Signed)
S/p pacemaker placement.  Followed by cardiology.  

## 2021-02-09 NOTE — Assessment & Plan Note (Signed)
Being followed by vascular surgery.  Legs being wrapped regularly.

## 2021-02-09 NOTE — Assessment & Plan Note (Signed)
On coreg and lasix.  Unclear exact medications he is taking.  Planning to f/u with Catie (pharmacist) to clarify.  Follow pressures.  Follow metabolic panel.

## 2021-02-09 NOTE — Assessment & Plan Note (Signed)
Now three years post treatment.  Doing well.  Continue f/u with oncology.  Just recently evaluated.

## 2021-02-09 NOTE — Assessment & Plan Note (Signed)
Still trying to cope with wife's passing.  Follow.

## 2021-02-09 NOTE — Assessment & Plan Note (Signed)
Dressing removed.  Irrigated.  No evidence of infection.  Topical abx ointment applied.  Redressed.  Supplies given.  Schedule soon f/u to reassess.

## 2021-02-09 NOTE — Assessment & Plan Note (Signed)
Continue pravastatin.  Low cholesterol diet and exercise.  Follow lipid panel and liver function tests.   

## 2021-02-09 NOTE — Assessment & Plan Note (Signed)
Follow cbc and iron studies.  

## 2021-02-12 ENCOUNTER — Encounter (INDEPENDENT_AMBULATORY_CARE_PROVIDER_SITE_OTHER): Payer: Self-pay

## 2021-02-12 ENCOUNTER — Other Ambulatory Visit: Payer: Self-pay

## 2021-02-12 ENCOUNTER — Ambulatory Visit (INDEPENDENT_AMBULATORY_CARE_PROVIDER_SITE_OTHER): Payer: Medicare Other | Admitting: Nurse Practitioner

## 2021-02-12 VITALS — BP 138/81 | HR 92 | Resp 16 | Wt 234.8 lb

## 2021-02-12 DIAGNOSIS — I890 Lymphedema, not elsewhere classified: Secondary | ICD-10-CM | POA: Diagnosis not present

## 2021-02-12 NOTE — Progress Notes (Signed)
History of Present Illness  There is no documented history at this time  Assessments & Plan   There are no diagnoses linked to this encounter.    Additional instructions  Subjective:  Patient presents with venous ulcer of the Bilateral lower extremity.    Procedure:  3 layer unna wrap was placed Bilateral lower extremity.   Plan:   Follow up in one week.  

## 2021-02-13 ENCOUNTER — Other Ambulatory Visit: Payer: Self-pay | Admitting: Internal Medicine

## 2021-02-16 ENCOUNTER — Encounter (INDEPENDENT_AMBULATORY_CARE_PROVIDER_SITE_OTHER): Payer: Self-pay | Admitting: Nurse Practitioner

## 2021-02-17 ENCOUNTER — Ambulatory Visit (INDEPENDENT_AMBULATORY_CARE_PROVIDER_SITE_OTHER): Payer: Medicare Other | Admitting: *Deleted

## 2021-02-17 DIAGNOSIS — E1151 Type 2 diabetes mellitus with diabetic peripheral angiopathy without gangrene: Secondary | ICD-10-CM

## 2021-02-17 DIAGNOSIS — I5022 Chronic systolic (congestive) heart failure: Secondary | ICD-10-CM

## 2021-02-18 ENCOUNTER — Other Ambulatory Visit: Payer: Self-pay

## 2021-02-18 ENCOUNTER — Ambulatory Visit (INDEPENDENT_AMBULATORY_CARE_PROVIDER_SITE_OTHER): Payer: Medicare Other | Admitting: Internal Medicine

## 2021-02-18 VITALS — BP 128/76 | HR 82 | Temp 97.8°F | Resp 16 | Ht 70.0 in | Wt 237.0 lb

## 2021-02-18 DIAGNOSIS — L97509 Non-pressure chronic ulcer of other part of unspecified foot with unspecified severity: Secondary | ICD-10-CM

## 2021-02-18 DIAGNOSIS — I428 Other cardiomyopathies: Secondary | ICD-10-CM | POA: Diagnosis not present

## 2021-02-18 DIAGNOSIS — E1151 Type 2 diabetes mellitus with diabetic peripheral angiopathy without gangrene: Secondary | ICD-10-CM

## 2021-02-18 DIAGNOSIS — Z1152 Encounter for screening for COVID-19: Secondary | ICD-10-CM | POA: Diagnosis not present

## 2021-02-18 DIAGNOSIS — S41112A Laceration without foreign body of left upper arm, initial encounter: Secondary | ICD-10-CM

## 2021-02-18 DIAGNOSIS — D509 Iron deficiency anemia, unspecified: Secondary | ICD-10-CM

## 2021-02-18 DIAGNOSIS — F439 Reaction to severe stress, unspecified: Secondary | ICD-10-CM | POA: Diagnosis not present

## 2021-02-18 DIAGNOSIS — I2510 Atherosclerotic heart disease of native coronary artery without angina pectoris: Secondary | ICD-10-CM

## 2021-02-18 DIAGNOSIS — I5022 Chronic systolic (congestive) heart failure: Secondary | ICD-10-CM | POA: Diagnosis not present

## 2021-02-18 DIAGNOSIS — I10 Essential (primary) hypertension: Secondary | ICD-10-CM | POA: Diagnosis not present

## 2021-02-18 DIAGNOSIS — E079 Disorder of thyroid, unspecified: Secondary | ICD-10-CM | POA: Diagnosis not present

## 2021-02-18 DIAGNOSIS — S90859A Superficial foreign body, unspecified foot, initial encounter: Secondary | ICD-10-CM

## 2021-02-18 DIAGNOSIS — E10621 Type 1 diabetes mellitus with foot ulcer: Secondary | ICD-10-CM

## 2021-02-18 DIAGNOSIS — N1832 Chronic kidney disease, stage 3b: Secondary | ICD-10-CM

## 2021-02-18 DIAGNOSIS — D649 Anemia, unspecified: Secondary | ICD-10-CM

## 2021-02-18 DIAGNOSIS — I495 Sick sinus syndrome: Secondary | ICD-10-CM

## 2021-02-18 DIAGNOSIS — I429 Cardiomyopathy, unspecified: Secondary | ICD-10-CM

## 2021-02-18 DIAGNOSIS — C8318 Mantle cell lymphoma, lymph nodes of multiple sites: Secondary | ICD-10-CM | POA: Diagnosis not present

## 2021-02-18 DIAGNOSIS — D61818 Other pancytopenia: Secondary | ICD-10-CM

## 2021-02-18 DIAGNOSIS — E7800 Pure hypercholesterolemia, unspecified: Secondary | ICD-10-CM

## 2021-02-18 LAB — BASIC METABOLIC PANEL
BUN: 17 mg/dL (ref 6–23)
CO2: 29 mEq/L (ref 19–32)
Calcium: 8 mg/dL — ABNORMAL LOW (ref 8.4–10.5)
Chloride: 105 mEq/L (ref 96–112)
Creatinine, Ser: 1.27 mg/dL (ref 0.40–1.50)
GFR: 56.4 mL/min — ABNORMAL LOW (ref >60.00)
Glucose, Bld: 69 mg/dL — ABNORMAL LOW (ref 70–99)
Potassium: 3.4 mEq/L — ABNORMAL LOW (ref 3.5–5.1)
Sodium: 144 mEq/L (ref 135–145)

## 2021-02-18 NOTE — Chronic Care Management (AMB) (Signed)
Chronic Care Management   CCM RN Visit Note  02/18/2021 Name: Gary [name] MRN: [id number] DOB: 1945/11/16  Subjective: Gary [name] is a 75 y.o. year old male who is a primary care patient of [name], MD. The care management team was consulted for assistance with disease management and care coordination needs.    Engaged with patient by telephone for follow up visit in response to provider referral for case management and/or care coordination services.   Consent to Services:  The patient was given information about Chronic Care Management services, agreed to services, and gave verbal consent prior to initiation of services.  Please see initial visit note for detailed documentation.   Patient agreed to services and verbal consent obtained.   Assessment: Review of patient past medical history, allergies, medications, health status, including review of consultants reports, laboratory and other test data, was performed as part of comprehensive evaluation and provision of chronic care management services.   SDOH (Social Determinants of Health) assessments and interventions performed:    CCM Care Plan  Allergies  Allergen Reactions  . Rofecoxib Nausea Only    Outpatient Encounter Medications as of 02/17/2021  Medication Sig  . insulin aspart (NOVOLOG) 100 UNIT/ML injection Basal rate 12am 0.8 units per hour, 9am 0.9 units per hour. (total basal insulin 20.7 units). Carbohydrate ratio 1 units for every 8gm of carbohydrate. Correction factor 1 units for every 50mg /dl over target cbg. Target CBG 80-120  . carvedilol (COREG) 3.125 MG tablet Take 1 tablet by mouth 2 (two) times daily with a meal.  . Cholecalciferol (VITAMIN D3) 50 MCG (2000 UT) CAPS Take by mouth.  . ferrous sulfate 325 (65 FE) MG tablet Take 325 mg by mouth daily with breakfast.  . furosemide (LASIX) 40 MG tablet Take 40 mg by mouth 2 (two) times daily. 2 tablets in the morning 1 tablet in the afternoon as needed  .  glucose 4 GM chewable tablet Chew 1 tablet (4 g total) by mouth once as needed for low blood sugar.  . levothyroxine (SYNTHROID) 25 MCG tablet Take 25 mcg by mouth daily before breakfast.  . nystatin cream (MYCOSTATIN) APPLY TO AFFECTED AREA TWICE A DAY  . potassium chloride (KLOR-CON) 10 MEQ tablet TAKE 1 TABLET BY MOUTH TWICE DAILY FOR 3 DAYS THEN CONTINUE ONCE DAILY  . pravastatin (PRAVACHOL) 40 MG tablet Take 40 mg by mouth daily.   . vitamin B-12 (CYANOCOBALAMIN) 1000 MCG tablet Take 1,000 mcg by mouth daily.   Facility-Administered Encounter Medications as of 02/17/2021  Medication  . heparin lock flush 100 unit/mL  . sodium chloride flush (NS) 0.9 % injection 10 mL  . sodium chloride flush (NS) 0.9 % injection 10 mL    Patient Active Problem List   Diagnosis Date Noted  . Arm laceration 02/09/2021  . Hypokalemia 10/24/2020  . Injury by nail 08/15/2020  . Shingles 07/24/2020  . Rash 07/02/2020  . Colon cancer screening 07/02/2020  . Sick sinus syndrome ([place]) 12/28/2019  . Acquired absence of kidney 09/13/2019  . Edema of lower extremity 09/13/2019  . 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 Proteinuria 09/13/2019  . Lower extremity pain, bilateral   . Diabetic foot ulcer associated with type 1 diabetes mellitus ([place])   . Acute on chronic systolic CHF (congestive heart failure) ([place])   . Cellulitis 08/18/2019  . Lower limb ulcer, calf ([place]) 08/01/2019  . Left hip pain 07/16/2019  .  Pain due to onychomycosis of toenails of both feet 04/03/2019  . Swelling of limb 02/07/2019  . Pancytopenia ([place]) 12/03/2018  . CKD (chronic kidney disease) stage 3, GFR 30-59 ml/min (HCC) 09/16/2018  . Left shoulder pain 09/16/2018  . Chronic cholecystitis 12/29/2017  . Graves disease 12/29/2017  . Peripheral neuropathy 12/29/2017  . S/p nephrectomy 12/29/2017  . 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 Symptomatic  bradycardia 12/02/2017  . AKI (acute kidney injury) ([place]) 12/02/2017  . Chronic systolic CHF (congestive heart failure) ([place]) 12/02/2017  . Saturday night paralysis 11/12/2017  . Hypoglycemia 10/28/2017  . Goals of care, counseling/discussion 10/24/2017  . Lymphedema 10/20/2017  . Iron deficiency anemia 09/11/2017  . Fall 08/13/2017  . Humerus fracture 08/13/2017  . Anemia 01/12/2017  . Bilateral carotid artery stenosis 06/29/2016  . Hand laceration 12/16/2015  . Adjustment disorder with depressed mood 08/11/2015  . Cardiomyopathy, idiopathic ([place]) 04/22/2015  . CAD in native artery 11/02/2014  . Mantle cell lymphoma ([place]) 08/03/2014  . History of colonic polyps 04/29/2014  . Irregular heart beat 04/22/2014  . SOB (shortness of breath) 04/22/2014  . Stress 03/21/2014  . PVC (premature ventricular contraction) 02/21/2014  . GERD (gastroesophageal reflux disease) 10/23/2013  . Gastroparesis 06/25/2013  . Chest pain 04/13/2013  . Thyroid disease 04/13/2013  . Diabetes ([place]) 04/13/2013  . Essential hypertension, benign 04/13/2013  . Hypercholesterolemia 04/13/2013    Conditions to be addressed/monitored:CHF and DMII  Care Plan : Heart Failure (Adult)  Updates made by Leona Singleton, RN since 02/18/2021 12:00 AM  Problem: Disease Progression (Heart Failure)   Priority: Medium  Long-Range Goal: Patient will report no heart failure exacerbations in the next 90 days   Start Date: 12/06/2020  Expected End Date: 08/11/2021  This Visit's Progress: Not on track  Recent Progress: Not on track  Priority: Medium  Current Barriers:  Marland Kitchen Knowledge deficit related to basic heart failure pathophysiology and self care management as evidenced by continued lower extremity edema requiring Unna Boots (changed every Wednesday at Vascular office).  Patient continues to report he does not weigh himself at home.  States he weighs weekly at providers office when he gets his unna boots changed.  States  he has found walker but still cannot locate his home scale at this time.  Discussed importance of daily weight monitoring and encouraged patient to use home walker to help balance on his home scale.  Reports lower extremity edema is about the same (better than a year ago) and denies any increase in shortness of breath at this time. . Patient does not have transportation to provider appointments . Transportation Barriers; going to use [facility] transportation for appointments within [facility] system and using [employer] for other medical appointments . Lacks social connections . Unable to perform IADLs independently Case Manager Clinical Goal(s):  . patient will verbalize understanding of Heart Failure Action Plan and when to call doctor . patient will take all Heart Failure mediations as prescribed . patient will weigh daily and record (notifying MD of 3 lb weight gain over night or 5 lb in a week) Interventions:  . Collaboration with [name], MD regarding development and update of comprehensive plan of care as evidenced by provider attestation and co-signature . Inter-disciplinary care team collaboration (see longitudinal plan of care) . Provided verbal education on low sodium diet . Advised patient to weigh each morning after emptying bladder . Discussed importance of daily weight and advised patient to weigh and record daily and encouraged  patient to do so; Reinforced need to weight daily and encouraged patient to use walker in front of scale to help balance while standing on scale . Advised and encouraged patient to find home scale and make sure that it works, seeking assistance from sister to help him locate the scale . Congratulated on finding walker so it can be used to stand on scale, also encouraged to use walker with ambulation . Reviewed signs and symptoms of heart failure exacerbation . Encouraged elevation of lower extremities as able . Encouraged continued use of [employer]  and [employer] transportation . Discussed medications and encouraged medication compliance . Encouraged to use 2022 [facility] Calendar Booklet previously sent to help with logging of weights and other vital signs . Discussed importance of weighing at home and not just at providers office Patient Goals/Self-Care Activities . Call office if I gain more than 2 pounds in one day or 5 pounds in one week . Use salt in moderation, low salt heart healthy diabetic diet . Find scale in the home and verify it still works . Weigh myself daily and write in log for provider review (at least start to weigh a few times a week working way up to daily) . Take medications as prescribed . Place walker in front of scale to help balance self while standing to weigh (please consider trying this to weigh yourself) . Ask sister to help you find your scale and verify if it is working properly Follow Up Plan: The care management team will reach out to the patient again over the next 30 business days.    Care Plan : Diabetes  Updates made by Leona Singleton, RN since 02/18/2021 12:00 AM  Problem: Hypoglycemia causing syncopal episodes   Priority: Medium  Long-Range Goal: Patient will report maintaing Hgb A1C of 7 or below in the next 90 days   Start Date: 01/27/2021  Expected End Date: 08/11/2021  This Visit's Progress: On track  Recent Progress: On track  Priority: Medium  Objective:  Lab Results  Component Value Date   HGBA1C 6.8 (H) 06/24/2020  Current Barriers:  Marland Kitchen Knowledge Deficits related to basic Diabetes pathophysiology and self care/management as evidenced by multiple syncopal episodes related to hypoglycemia.  Patient reports fasting blood sugars have been ranging 100-150's.  Does report some hypoglycemia with rates 60-70's right before dinner.  Patient reporting right foot pain for the past 3 days and when looking yesterday, realized he had a splinter in his foot.  States he was able to remove the splinter  and foot is still a little sore.  Denies any drainage or signs and symptoms of infection. . Limited Social Support . Trouble getting insulin pump supplies-reports he now has insulin pump sensors Case Manager Clinical Goal(s):  . patient will demonstrate improved adherence to prescribed treatment plan for diabetes self care/management as evidenced by: at least 4 times a day monitoring and recording of CBG, adherence to ADA/ carb modified diet, adherence to prescribed medication regimen, contacting provider for new or worsened symptoms or questions Interventions:  . Collaboration with [name], MD regarding development and update of comprehensive plan of care as evidenced by provider attestation and co-signature . Inter-disciplinary care team collaboration (see longitudinal plan of care) . Provided education to patient about basic DM disease process . Discussed medications and encouraged medication compliance . Discussed plans with patient for ongoing care management follow up and provided patient with direct contact information for care management team . Encouraged  continued participation with CCM Pharmacist  . Reviewed glucose ranges and hypoglycemic events; and discussed dangers of hypoglycemia and treatment options, need for immediate sugar replacement with juice, sugar, soda in addition to snack or candy bar or glucose tablets . Discussed proper treatment of hypoglycemia . Reviewed scheduled/upcoming provider appointments including: [facility] on 03/07/21 . Discussed healthy meal and snack options . Rediscussed possible Home Health Nursing or Therapy referral for safety evaluation, educating and discussing even if they start services, patient can cancel at any time (patient still declines at this time, but states he will continue to think about it) . Congratulated patient on Hgb A1C at 7 . Discussed logging blood sugars and writing down insulin doses taken to help provider manage medications;  sent 2022 Calendar Booklet to help with keeping log of blood sugars and insulin doses . Reviewed and discussed proper diabetic foot care, encouraged patient to keep check on right foot splinter wound for signs and symptoms of infection . Discussed and encouraged patient to wear bedroom/house/slides while in the home and not to walk around bare foot . Discussed with patient asking podiatrist for prescription for diabetic shoes Patient Goals/Self-Care Activities . Check blood sugar at least 5 times a day . Check blood sugar if I feel it is too high or too low . Take the blood sugar meter and log to all doctor visits  . Consider drinking Boost or other supplement in the evenings if not eating a complete meal . Make sure to have candy or glucose tablets on you at all times; try drinking coke along with eating snack to treat hypoglycemia . Eat at least 3 meals a day with snack in between to help prevent hypoglycemia . Please consider Home Health Nursing/Therapy involvement . Wear shoes (slides/bedroom shoes) while in the house; DO NOT GO BAREFOOT Follow Up Plan: The care management team will reach out to the patient again over the next 30 business days.       Plan:The care management team will reach out to the patient again over the next 30 business days.  [name] RN, MSN RN Care Management Coordinator [place] [phone] [email or web address]

## 2021-02-18 NOTE — Patient Instructions (Signed)
Visit Information  PATIENT GOALS: Goals Addressed            This Visit's Progress   . (RNCM) Monitor and Manage My Blood Sugar-Diabetes Type 1   On track    Timeframe:  Long-Range Goal Priority:  High Start Date:   12/06/20                          Expected End Date:  06/10/21                     Follow Up Date 03/03/2021    . Check blood sugar at least 5 times a day . Check blood sugar if I feel it is too high or too low . Take the blood sugar meter and log to all doctor visits  . Consider drinking Boost or other supplement in the evenings if not eating a complete meal . Make sure to have candy or glucose tablets on you at all times; try drinking coke along with eating snack to treat hypoglycemia . Eat at least 3 meals a day with snack in between to help prevent hypoglycemia . Please consider Home Health Nursing/Therapy involvement . Wear shoes (slides/bedroom shoes) while in the house; DO NOT GO BAREFOOT   Why is this important?    Checking your blood sugar at home helps to keep it from getting very high or very low.   Writing the results in a diary or log helps the doctor know how to care for you.   Your blood sugar log should have the time, the date and the results.   Also, write down the amount of insulin or other medicine you take.   Other information like what you ate, exercise done and how you were feeling will also be helpful..     Notes:     Marland Kitchen (RNCM) Track and Manage Fluids and Swelling-Heart Failure   Not on track    Timeframe:  Long-Range Goal Priority:  Medium Start Date:   12/06/20                          Expected End Date:  06/10/21                     Follow Up Date 03/03/21   . Call office if I gain more than 2 pounds in one day or 5 pounds in one week . Use salt in moderation, low salt heart healthy diabetic diet . Find scale in the home and verify it still works . Weigh myself daily and write in log for provider review (at least start to weigh a few  times a week working way up to daily) . Take medications as prescribed . Place walker in front of scale to help balance self while standing to weigh (please consider trying this to weigh yourself) . Ask sister to help you find your scale and verify if it is working properly   Why is this important?    It is important to check your weight daily and watch how much salt and liquids you have.   It will help you to manage your heart failure.    Notes:        Patient verbalizes understanding of instructions provided today and agrees to view in [place].   The care management team will reach out to the patient again over the next 30 business days.  [name] RN, MSN RN Care Management Coordinator [place] [phone] [email or web address]

## 2021-02-18 NOTE — Progress Notes (Signed)
[name], male   DOB: 01-15-1946, 75 y.o.   MRN: [id number]   Subjective:    [name], male    DOB: 09/17/1946, 75 y.o.   MRN: [id number]  HPI This visit occurred during the SARS-CoV-2 public health emergency.  Safety protocols were in place, including screening questions prior to the visit, additional usage of staff PPE, and extensive cleaning of exam room while observing appropriate contact time as indicated for disinfecting solutions.  Patient here for a scheduled follow up. Here to follow up regarding arm laceration.  Evaluated and wrapped last visit.  Was to continue dressing changes at home.  Comes in today - arm laceration - healing/scabbed over.  No pain.  He does report that he found a splinter in his foot.  He removed it.  No significant tenderness with palpation - bottom of this foot.  His legs remained wrapped to help with swelling.  Sees vascular surgery regularly to have them wrapped.  No chest pain.  Breathing stable.  No increased cough or congestion.  Eating meals on wheels.  Has his sensor - helping to detect his low sugar.  Sees Dr Honor Junes.  He did find his walker. We discussed today regarding assisted living.  He is still not interested.  Also discussed home health.  He continues to decline.    Past Medical History:  Diagnosis Date  . CHF (congestive heart failure) ([place])   . Depression   . 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 Gastroparesis   . Graves disease   . Hypercholesterolemia   . Hypertension   . Mantle cell lymphoma ([place]) 07/2014  . Peripheral neuropathy   . Shingles    Past Surgical History:  Procedure Laterality Date  . CHOLECYSTECTOMY    . NEPHRECTOMY     right  . PACEMAKER INSERTION N/A 12/07/2017   Procedure: INSERTION PACEMAKER DUAL CHAMBER INITIAL INSERT;  Surgeon: Isaias Cowman, MD;  Location: [facility] ORS;  Service: Cardiovascular;  Laterality: N/A;  . PORTA  CATH INSERTION N/A 11/03/2017   Procedure: PORTA CATH INSERTION;  Surgeon: Katha Cabal, MD;  Location: [facility];  Service: Cardiovascular;  Laterality: N/A;   Family History  Problem Relation Age of Onset  . Breast cancer Mother   . Diabetes Father   . Cancer Father        Lymphoma  . Alcohol abuse Maternal Uncle   . Diabetes Sister   . Heart disease Other        grandfather   Social History   Socioeconomic History  . Marital status: Widowed    Spouse name: Not on file  . Number of children: 1  . Years of education: 44  . Highest education level: 12th grade  Occupational History  . Occupation: Retired  Tobacco Use  . Smoking status: Former Research scientist (life sciences)  . Smokeless tobacco: Current User    Types: Chew  Vaping Use  . Vaping Use: Never used  Substance and Sexual Activity  . Alcohol use: Yes    Alcohol/week: 3.0 standard drinks    Types: 3 Cans of beer per week    Comment: nightly  . Drug use: Never  . Sexual activity: Not Currently  Other Topics Concern  . Not on file  Social History Narrative  . Not on file   Social Determinants of Health   Financial Resource Strain: Low Risk   . Difficulty of Paying Living  Expenses: Not very hard  Food Insecurity: No Food Insecurity  . Worried About Charity fundraiser in the Last Year: Never true  . Ran Out of Food in the Last Year: Never true  Transportation Needs: Unmet Transportation Needs  . Lack of Transportation (Medical): Yes  . Lack of Transportation (Non-Medical): No  Physical Activity: Inactive  . Days of Exercise per Week: 0 days  . Minutes of Exercise per Session: 0 min  Stress: Stress Concern Present  . Feeling of Stress : To some extent  Social Connections: Socially Isolated  . Frequency of Communication with Friends and Family: More than three times a week  . Frequency of Social Gatherings with Friends and Family: Twice a week  . Attends Religious Services: Never  . Active Member of Clubs or  Organizations: No  . Attends Archivist Meetings: Never  . Marital Status: Widowed    Outpatient Encounter Medications as of 02/18/2021  Medication Sig  . carvedilol (COREG) 3.125 MG tablet Take 1 tablet by mouth 2 (two) times daily with a meal.  . Cholecalciferol (VITAMIN D3) 50 MCG (2000 UT) CAPS Take by mouth.  . ferrous sulfate 325 (65 FE) MG tablet Take 325 mg by mouth daily with breakfast.  . furosemide (LASIX) 40 MG tablet Take 40 mg by mouth 2 (two) times daily. 2 tablets in the morning 1 tablet in the afternoon as needed  . glucose 4 GM chewable tablet Chew 1 tablet (4 g total) by mouth once as needed for low blood sugar.  . insulin aspart (NOVOLOG) 100 UNIT/ML injection Basal rate 12am 0.8 units per hour, 9am 0.9 units per hour. (total basal insulin 20.7 units). Carbohydrate ratio 1 units for every 8gm of carbohydrate. Correction factor 1 units for every 38m/dl over target cbg. Target CBG 80-120  . levothyroxine (SYNTHROID) 25 MCG tablet Take 25 mcg by mouth daily before breakfast.  . nystatin cream (MYCOSTATIN) APPLY TO AFFECTED AREA TWICE A DAY  . potassium chloride (KLOR-CON) 10 MEQ tablet TAKE 1 TABLET BY MOUTH TWICE DAILY FOR 3 DAYS THEN CONTINUE ONCE DAILY  . pravastatin (PRAVACHOL) 40 MG tablet Take 40 mg by mouth daily.   . vitamin B-12 (CYANOCOBALAMIN) 1000 MCG tablet Take 1,000 mcg by mouth daily.   Facility-Administered Encounter Medications as of 02/18/2021  Medication  . heparin lock flush 100 unit/mL  . sodium chloride flush (NS) 0.9 % injection 10 mL  . sodium chloride flush (NS) 0.9 % injection 10 mL    Review of Systems  Constitutional: Negative for appetite change and fever.  HENT: Negative for congestion and sinus pressure.   Respiratory: Negative for cough and chest tightness.        Breathing stable.   Cardiovascular: Positive for leg swelling. Negative for chest pain and palpitations.  Gastrointestinal: Negative for abdominal pain, diarrhea,  nausea and vomiting.  Genitourinary: Negative for difficulty urinating and dysuria.  Musculoskeletal: Negative for joint swelling and myalgias.  Skin: Negative for rash.       Stasis changes lower extremity.  Left arm lesion - healed.   Neurological: Negative for dizziness and headaches.  Psychiatric/Behavioral: Negative for agitation and dysphoric mood.       Objective:    Physical Exam Vitals reviewed.  Constitutional:      General: He is not in acute distress.    Appearance: Normal appearance. He is well-developed.  HENT:     Head: Normocephalic and atraumatic.     Right Ear: External ear  normal.     Left Ear: External ear normal.  Eyes:     General: No scleral icterus.       Right eye: No discharge.        Left eye: No discharge.     Conjunctiva/sclera: Conjunctivae normal.  Cardiovascular:     Rate and Rhythm: Normal rate and regular rhythm.  Pulmonary:     Effort: Pulmonary effort is normal. No respiratory distress.     Breath sounds: Normal breath sounds.  Abdominal:     General: Bowel sounds are normal.     Palpations: Abdomen is soft.     Tenderness: There is no abdominal tenderness.  Musculoskeletal:     Cervical back: Neck supple. No tenderness.     Comments: Stable swelling.  Legs wrapped.    Lymphadenopathy:     Cervical: No cervical adenopathy.  Skin:    Findings: No rash.     Comments: Left forearm lesion - healed.  Lower extremities - wrapped.  No increased redness - plantar surface of foot. No foreign body visualized.   Neurological:     Mental Status: He is alert.  Psychiatric:        Mood and Affect: Mood normal.        Behavior: Behavior normal.     BP 128/76   Pulse 82   Temp 97.8 F (36.6 C)   Resp 16   Ht $R'5\' 10"'iJ$  (1.778 m)   Wt 237 lb (107.5 kg)   SpO2 98%   BMI 34.01 kg/m  Wt Readings from Last 3 Encounters:  02/19/21 235 lb (106.6 kg)  02/18/21 237 lb (107.5 kg)  02/12/21 234 lb 12.8 oz (106.5 kg)     Lab Results  Component  Value Date   WBC 6.1 07/29/2020   HGB 13.9 07/29/2020   HCT 40.0 07/29/2020   PLT 102 (L) 07/29/2020   GLUCOSE 69 (L) 02/18/2021   CHOL 132 06/24/2020   TRIG 61.0 06/24/2020   HDL 51.50 06/24/2020   LDLCALC 68 06/24/2020   ALT 16 07/29/2020   AST 24 07/29/2020   NA 144 02/18/2021   K 3.4 (L) 02/18/2021   CL 105 02/18/2021   CREATININE 1.27 02/18/2021   BUN 17 02/18/2021   CO2 29 02/18/2021   TSH 0.94 10/24/2020   INR 1.12 07/14/2018   HGBA1C 7.0 01/23/2021   MICROALBUR 1.9 09/22/2016    CT Abdomen Pelvis Wo Contrast  Result Date: 01/25/2021 CLINICAL DATA:  Restaging mantle cell lymphoma EXAM: CT CHEST, ABDOMEN AND PELVIS WITHOUT CONTRAST TECHNIQUE: Multidetector CT imaging of the chest, abdomen and pelvis was performed following the standard protocol without IV contrast. COMPARISON:  07/29/2020 FINDINGS: CT CHEST FINDINGS Cardiovascular: Right Port-A-Cath tip: SVC. Dual lead pacer noted with proximal and distal lead tips in the right atrium and ventricle, respectively. Left anterior descending and right coronary artery atherosclerotic calcification. Mediastinum/Nodes: No pathologic adenopathy in the chest identified. Lungs/Pleura: Mild lingular scarring. Mild scarring in the posterior basal segment right lower lobe. Musculoskeletal: Various old rib fractures are present. However, there is subacute fractures of the right fourth, fifth, sixth, and seventh ribs laterally which were not present on 07/19/2020. Chronic deformity of the right proximal humerus. Thoracic kyphosis. CT ABDOMEN PELVIS FINDINGS Hepatobiliary: Cholecystectomy.  Otherwise unremarkable. Pancreas: Unremarkable Spleen: Unremarkable.  No splenomegaly. Adrenals/Urinary Tract: Absent right kidney. Both adrenal glands appear unremarkable. Left kidney unremarkable. Stomach/Bowel: Unremarkable Vascular/Lymphatic: Aortoiliac atherosclerotic vascular disease. Left common iliac node 1.0 cm in short axis on image  88 series 2,  previously the same by my measurements. Right common iliac node 0.8 cm in short axis on image 95 series 2, previously the same by my measurements. Reproductive: Mild prostatomegaly. Other: No supplemental non-categorized findings. Musculoskeletal: Moderate degenerative hip arthropathy bilaterally. IMPRESSION: 1. No overtly pathologic adenopathy in the chest, abdomen, or pelvis, although there is a borderline enlarged but stable left common iliac node 1.0 cm in short axis. No splenomegaly 2. Subacute interval fractures of the right fourth, fifth, sixth, and seventh ribs. 3. Other imaging findings of potential clinical significance: Aortic Atherosclerosis (ICD10-I70.0). Coronary atherosclerosis. Chronic deformity of the right proximal humerus. Mild prostatomegaly. Absent right kidney. Electronically Signed   By: Van Clines M.D.   On: 01/25/2021 17:57   CT Chest Wo Contrast  Result Date: 01/25/2021 CLINICAL DATA:  Restaging mantle cell lymphoma EXAM: CT CHEST, ABDOMEN AND PELVIS WITHOUT CONTRAST TECHNIQUE: Multidetector CT imaging of the chest, abdomen and pelvis was performed following the standard protocol without IV contrast. COMPARISON:  07/29/2020 FINDINGS: CT CHEST FINDINGS Cardiovascular: Right Port-A-Cath tip: SVC. Dual lead pacer noted with proximal and distal lead tips in the right atrium and ventricle, respectively. Left anterior descending and right coronary artery atherosclerotic calcification. Mediastinum/Nodes: No pathologic adenopathy in the chest identified. Lungs/Pleura: Mild lingular scarring. Mild scarring in the posterior basal segment right lower lobe. Musculoskeletal: Various old rib fractures are present. However, there is subacute fractures of the right fourth, fifth, sixth, and seventh ribs laterally which were not present on 07/19/2020. Chronic deformity of the right proximal humerus. Thoracic kyphosis. CT ABDOMEN PELVIS FINDINGS Hepatobiliary: Cholecystectomy.  Otherwise  unremarkable. Pancreas: Unremarkable Spleen: Unremarkable.  No splenomegaly. Adrenals/Urinary Tract: Absent right kidney. Both adrenal glands appear unremarkable. Left kidney unremarkable. Stomach/Bowel: Unremarkable Vascular/Lymphatic: Aortoiliac atherosclerotic vascular disease. Left common iliac node 1.0 cm in short axis on image 88 series 2, previously the same by my measurements. Right common iliac node 0.8 cm in short axis on image 95 series 2, previously the same by my measurements. Reproductive: Mild prostatomegaly. Other: No supplemental non-categorized findings. Musculoskeletal: Moderate degenerative hip arthropathy bilaterally. IMPRESSION: 1. No overtly pathologic adenopathy in the chest, abdomen, or pelvis, although there is a borderline enlarged but stable left common iliac node 1.0 cm in short axis. No splenomegaly 2. Subacute interval fractures of the right fourth, fifth, sixth, and seventh ribs. 3. Other imaging findings of potential clinical significance: Aortic Atherosclerosis (ICD10-I70.0). Coronary atherosclerosis. Chronic deformity of the right proximal humerus. Mild prostatomegaly. Absent right kidney. Electronically Signed   By: Van Clines M.D.   On: 01/25/2021 17:57       Assessment & Plan:   Problem List Items Addressed This Visit    Anemia    Follow cbc.       Arm laceration    Dressing removed. Healed.  No longer needs dressing.         Cardiomyopathy, idiopathic (HCC)    On lasix.  Breathing stable. Needs to weigh himself daily.       Chronic systolic CHF (congestive heart failure) ([place]) - Primary    On lasix.  Breathing stable.  Appears to be off spironolactone.  Follow low sodium diet.  Have asked him to weight himself daily.        Relevant Orders   Basic metabolic panel (Completed)   CKD (chronic kidney disease) stage 3, GFR 30-59 ml/min (HCC)    Avoid antiinflammatories.  Has been evaluated by Dr Candiss Norse.  Follow metabolic panel.  Diabetes  [facility])    Being followed by endocrinology.  Has sensor to hopefully avoid problems with low sugars.  Follow met b and a1c.  Continues with insulin pump.       Diabetic foot ulcer associated with type 1 diabetes mellitus ([place])    Followed by vascular surgery and Dr Honor Junes. Insulin pump.  Has sensor to help avoid problems with low sugars.  Sees vascular regularly to have feet and legs wrapped.       Essential hypertension, benign    On coreg and lasix.  Blood pressure as outlined.  Follow pressures.  Follow metabolic panel.       Hypercholesterolemia    Continue pravastatin.  Low cholesterol diet and exercise.  Follow lipid panel and liver function tests.       Iron deficiency anemia    Follow cbc and iron studies.       Mantle cell lymphoma ([place])    Three years post treatment.  Followed by oncology.       Pancytopenia ([place])    Has been followed by oncology.        Sick sinus syndrome [facility])    S/p pacemaker placement.  Followed by cardiology.       Splinter of foot without infection    He removed the splinter.  No residual infection.  Follow.       Stress    Still trying to cope with his wife's passing.  Stable.       Thyroid disease    Followed by endocrinology.         Other Visit Diagnoses    Encounter for screening for COVID-19       Relevant Orders   SARS-CoV-2 Semi-Quantitative Total Antibody, Spike (Completed)       [name], MD

## 2021-02-19 ENCOUNTER — Ambulatory Visit (INDEPENDENT_AMBULATORY_CARE_PROVIDER_SITE_OTHER): Payer: Medicare Other | Admitting: Nurse Practitioner

## 2021-02-19 VITALS — BP 134/85 | HR 69 | Resp 16 | Ht 70.0 in | Wt 235.0 lb

## 2021-02-19 DIAGNOSIS — I890 Lymphedema, not elsewhere classified: Secondary | ICD-10-CM

## 2021-02-19 NOTE — Progress Notes (Signed)
History of Present Illness  There is no documented history at this time  Assessments & Plan   There are no diagnoses linked to this encounter.    Additional instructions  Subjective:  Patient presents with venous ulcer of the Bilateral lower extremity.    Procedure:  3 layer unna wrap was placed Bilateral lower extremity.   Plan:   Follow up in one week.  

## 2021-02-20 LAB — SARS-COV-2 SEMI-QUANTITATIVE TOTAL ANTIBODY, SPIKE: SARS COV2 AB, Total Spike Semi QN: <0.4 U/mL (ref <0.8)

## 2021-02-23 ENCOUNTER — Encounter: Payer: Self-pay | Admitting: Internal Medicine

## 2021-02-23 DIAGNOSIS — S90859A Superficial foreign body, unspecified foot, initial encounter: Secondary | ICD-10-CM | POA: Insufficient documentation

## 2021-02-23 NOTE — Assessment & Plan Note (Signed)
Followed by endocrinology 

## 2021-02-23 NOTE — Assessment & Plan Note (Signed)
On lasix.  Breathing stable. Needs to weigh himself daily.

## 2021-02-23 NOTE — Assessment & Plan Note (Signed)
S/p pacemaker placement.  Followed by cardiology.  

## 2021-02-23 NOTE — Assessment & Plan Note (Signed)
On lasix.  Breathing stable.  Appears to be off spironolactone.  Follow low sodium diet.  Have asked him to weight himself daily.

## 2021-02-23 NOTE — Assessment & Plan Note (Signed)
Still trying to cope with his wife's passing.  Stable.

## 2021-02-23 NOTE — Assessment & Plan Note (Signed)
On coreg and lasix.  Blood pressure as outlined.  Follow pressures.  Follow metabolic panel.  

## 2021-02-23 NOTE — Assessment & Plan Note (Signed)
Being followed by endocrinology.  Has sensor to hopefully avoid problems with low sugars.  Follow met b and a1c.  Continues with insulin pump.

## 2021-02-23 NOTE — Assessment & Plan Note (Addendum)
Dressing removed. Healed.  No longer needs dressing.

## 2021-02-23 NOTE — Assessment & Plan Note (Signed)
Avoid antiinflammatories.  Has been evaluated by Dr Candiss Norse.  Follow metabolic panel.

## 2021-02-23 NOTE — Assessment & Plan Note (Signed)
He removed the splinter.  No residual infection.  Follow.

## 2021-02-23 NOTE — Assessment & Plan Note (Signed)
Has been followed by oncology.  

## 2021-02-23 NOTE — Assessment & Plan Note (Signed)
Follow [facility].  

## 2021-02-23 NOTE — Assessment & Plan Note (Signed)
Follow cbc and iron studies.  

## 2021-02-23 NOTE — Assessment & Plan Note (Signed)
Continue pravastatin.  Low cholesterol diet and exercise.  Follow lipid panel and liver function tests.   

## 2021-02-23 NOTE — Assessment & Plan Note (Signed)
Three years post treatment.  Followed by oncology.

## 2021-02-23 NOTE — Assessment & Plan Note (Signed)
Followed by vascular surgery and Dr Honor Junes. Insulin pump.  Has sensor to help avoid problems with low sugars.  Sees vascular regularly to have feet and legs wrapped.

## 2021-02-25 ENCOUNTER — Encounter (INDEPENDENT_AMBULATORY_CARE_PROVIDER_SITE_OTHER): Payer: Self-pay | Admitting: Nurse Practitioner

## 2021-02-26 ENCOUNTER — Telehealth: Payer: Medicare Other

## 2021-02-26 ENCOUNTER — Ambulatory Visit (INDEPENDENT_AMBULATORY_CARE_PROVIDER_SITE_OTHER): Payer: Medicare Other | Admitting: Nurse Practitioner

## 2021-02-26 ENCOUNTER — Encounter (INDEPENDENT_AMBULATORY_CARE_PROVIDER_SITE_OTHER): Payer: Self-pay | Admitting: Nurse Practitioner

## 2021-02-26 ENCOUNTER — Other Ambulatory Visit: Payer: Self-pay

## 2021-02-26 VITALS — BP 106/79 | HR 88 | Ht 69.0 in | Wt 232.0 lb

## 2021-02-26 DIAGNOSIS — I890 Lymphedema, not elsewhere classified: Secondary | ICD-10-CM

## 2021-02-26 DIAGNOSIS — I10 Essential (primary) hypertension: Secondary | ICD-10-CM | POA: Diagnosis not present

## 2021-02-26 DIAGNOSIS — E1151 Type 2 diabetes mellitus with diabetic peripheral angiopathy without gangrene: Secondary | ICD-10-CM

## 2021-03-02 ENCOUNTER — Encounter (INDEPENDENT_AMBULATORY_CARE_PROVIDER_SITE_OTHER): Payer: Self-pay | Admitting: Nurse Practitioner

## 2021-03-02 NOTE — Progress Notes (Signed)
Subjective:    [name], male    DOB: 12-09-1945, 75 y.o.   MRN: [id number] No chief complaint on file.   Rayburn Mundis is a 75 year old male that presents today for evaluation of lower extremity edema following [place] wraps.  The patient is doing well thus far.  The swelling is well controlled and there are no new ulcerations.  He continues to have several small ulceration on his toes but it has not worsened and actually looks like it has improved somewhat.  He denies any more recent syncopal events. He denies any fever or chills   Review of Systems  Cardiovascular: Positive for leg swelling.  Skin: Positive for wound.  All other systems reviewed and are negative.      Objective:   Physical Exam Vitals reviewed.  HENT:     Head: Normocephalic.  Cardiovascular:     Rate and Rhythm: Normal rate.     Pulses: Decreased pulses.  Pulmonary:     Effort: Pulmonary effort is normal.  Neurological:     Mental Status: He is alert and oriented to person, place, and time.     Motor: Weakness present.     Gait: Gait abnormal.  Psychiatric:        Mood and Affect: Mood normal.        Behavior: Behavior normal.        Thought Content: Thought content normal.        Judgment: Judgment normal.     BP 106/79   Pulse 88   Ht 5\' 9"  (1.753 m)   Wt 232 lb (105.2 kg)   BMI 34.26 kg/m   Past Medical History:  Diagnosis Date  . CHF (congestive heart failure) ([place])   . Depression   . 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 Gastroparesis   . Graves disease   . Hypercholesterolemia   . Hypertension   . Mantle cell lymphoma ([place]) 07/2014  . Peripheral neuropathy   . Shingles     Social History   Socioeconomic History  . Marital status: Widowed    Spouse name: Not on file  . Number of children: 1  . Years of education: 25  . Highest education level: 12th grade  Occupational History  . Occupation: Retired   Tobacco Use  . Smoking status: Former Research scientist (life sciences)  . Smokeless tobacco: Current User    Types: Chew  Vaping Use  . Vaping Use: Never used  Substance and Sexual Activity  . Alcohol use: Yes    Alcohol/week: 3.0 standard drinks    Types: 3 Cans of beer per week    Comment: nightly  . Drug use: Never  . Sexual activity: Not Currently  Other Topics Concern  . Not on file  Social History Narrative  . Not on file   Social Determinants of Health   Financial Resource Strain: Low Risk   . Difficulty of Paying Living Expenses: Not very hard  Food Insecurity: No Food Insecurity  . Worried About Charity fundraiser in the Last Year: Never true  . Ran Out of Food in the Last Year: Never true  Transportation Needs: Unmet Transportation Needs  . Lack of Transportation (Medical): Yes  . Lack of Transportation (Non-Medical): No  Physical Activity: Inactive  . Days of Exercise per Week: 0 days  . Minutes of Exercise per Session: 0 min  Stress: Stress Concern Present  . Feeling of Stress :  To some extent  Social Connections: Socially Isolated  . Frequency of Communication with Friends and Family: More than three times a week  . Frequency of Social Gatherings with Friends and Family: Twice a week  . Attends Religious Services: Never  . Active Member of Clubs or Organizations: No  . Attends Archivist Meetings: Never  . Marital Status: Widowed  Intimate Partner Violence: Not At Risk  . Fear of Current or Ex-Partner: No  . Emotionally Abused: No  . Physically Abused: No  . Sexually Abused: No    Past Surgical History:  Procedure Laterality Date  . CHOLECYSTECTOMY    . NEPHRECTOMY     right  . PACEMAKER INSERTION N/A 12/07/2017   Procedure: INSERTION PACEMAKER DUAL CHAMBER INITIAL INSERT;  Surgeon: Isaias Cowman, MD;  Location: [facility];  Service: Cardiovascular;  Laterality: N/A;  . PORTA CATH INSERTION N/A 11/03/2017   Procedure: PORTA CATH INSERTION;  Surgeon: Katha Cabal, MD;  Location: [facility];  Service: Cardiovascular;  Laterality: N/A;    Family History  Problem Relation Age of Onset  . Breast cancer Mother   . Diabetes Father   . Cancer Father        Lymphoma  . Alcohol abuse Maternal Uncle   . Diabetes Sister   . Heart disease Other        grandfather    Allergies  Allergen Reactions  . Rofecoxib Nausea Only    CBC Latest Ref Rng & Units 07/29/2020 06/24/2020 01/24/2020  WBC 4.0 - 10.5 K/uL 6.1 6.3 5.7  Hemoglobin 13.0 - 17.0 g/dL 13.9 13.1 13.5  Hematocrit 39.0 - 52.0 % 40.0 39.0 40.8  Platelets 150 - 400 K/uL 102(L) 131.0(L) 118(L)      CMP     Component Value Date/Time   NA 144 02/18/2021 1249   NA 138 07/15/2014 0515   K 3.4 (L) 02/18/2021 1249   K 3.5 01/16/2015 1429   CL 105 02/18/2021 1249   CL 110 (H) 07/15/2014 0515   CO2 29 02/18/2021 1249   CO2 24 07/15/2014 0515   GLUCOSE 69 (L) 02/18/2021 1249   GLUCOSE 145 (H) 07/15/2014 0515   BUN 17 02/18/2021 1249   BUN 17 07/15/2014 0515   CREATININE 1.27 02/18/2021 1249   CREATININE 0.89 01/16/2015 1429   CALCIUM 8.0 (L) 02/18/2021 1249   CALCIUM 7.1 (L) 07/15/2014 0515   PROT 7.1 07/29/2020 0952   PROT 6.2 (L) 10/22/2014 1000   ALBUMIN 3.4 (L) 07/29/2020 0952   ALBUMIN 2.6 (L) 10/22/2014 1000   AST 24 07/29/2020 0952   AST 19 10/22/2014 1000   ALT 16 07/29/2020 0952   ALT 21 10/22/2014 1000   ALKPHOS 115 07/29/2020 0952   ALKPHOS 118 (H) 10/22/2014 1000   BILITOT 1.1 07/29/2020 0952   BILITOT 0.8 10/22/2014 1000   GFRNONAA >60 01/24/2021 0830   GFRNONAA >60 01/16/2015 1429   GFRAA 41 (L) 01/24/2020 0925   GFRAA >60 01/16/2015 1429     No results found.     Assessment & Plan:   1. Lymphedema The patient's lower extremity edema is improving.  The patient has some small blisters on his toes but his recent shoe changes should hopefully help this.  We will continue the patient wraps to be changed on a weekly basis.  We will follow-up in  4 weeks.  2. Essential hypertension, benign Continue antihypertensive medications as already ordered, these medications have been reviewed and there are no  changes at this time.   3. Type 2 diabetes mellitus with diabetic peripheral angiopathy without gangrene, without long-term current use of insulin (HCC) Continue hypoglycemic medications as already ordered, these medications have been reviewed and there are no changes at this time.  Hgb A1C to be monitored as already arranged by primary service    Current Outpatient Medications on File Prior to Visit  Medication Sig Dispense Refill  . carvedilol (COREG) 3.125 MG tablet Take 1 tablet by mouth 2 (two) times daily with a meal.    . Cholecalciferol (VITAMIN D3) 50 MCG (2000 UT) CAPS Take by mouth.    . ferrous sulfate 325 (65 FE) MG tablet Take 325 mg by mouth daily with breakfast.    . furosemide (LASIX) 40 MG tablet Take 40 mg by mouth 2 (two) times daily. 2 tablets in the morning 1 tablet in the afternoon as needed    . glucose 4 GM chewable tablet Chew 1 tablet (4 g total) by mouth once as needed for low blood sugar. 50 tablet 0  . insulin aspart (NOVOLOG) 100 UNIT/ML injection Basal rate 12am 0.8 units per hour, 9am 0.9 units per hour. (total basal insulin 20.7 units). Carbohydrate ratio 1 units for every 8gm of carbohydrate. Correction factor 1 units for every 50mg /dl over target cbg. Target CBG 80-120 1 vial 12  . levothyroxine (SYNTHROID) 25 MCG tablet Take 25 mcg by mouth daily before breakfast.    . nystatin cream (MYCOSTATIN) APPLY TO AFFECTED AREA TWICE A DAY 30 g 1  . potassium chloride (KLOR-CON) 10 MEQ tablet TAKE 1 TABLET BY MOUTH TWICE DAILY FOR 3 DAYS THEN CONTINUE ONCE DAILY 30 tablet 0  . pravastatin (PRAVACHOL) 40 MG tablet Take 40 mg by mouth daily.     . vitamin B-12 (CYANOCOBALAMIN) 1000 MCG tablet Take 1,000 mcg by mouth daily.     Current Facility-Administered Medications on File Prior to Visit  Medication Dose  Route Frequency Provider Last Rate Last Admin  . heparin lock flush 100 unit/mL  500 Units Intravenous Once Lloyd Huger, MD      . sodium chloride flush (NS) 0.9 % injection 10 mL  10 mL Intravenous PRN Lloyd Huger, MD   10 mL at 02/01/18 0829  . sodium chloride flush (NS) 0.9 % injection 10 mL  10 mL Intravenous PRN Lloyd Huger, MD   10 mL at 04/05/18 0800    There are no Patient Instructions on file for this visit. No follow-ups on file.   Kris Hartmann, NP

## 2021-03-03 ENCOUNTER — Ambulatory Visit: Payer: Medicare Other | Admitting: *Deleted

## 2021-03-03 DIAGNOSIS — I5022 Chronic systolic (congestive) heart failure: Secondary | ICD-10-CM

## 2021-03-03 DIAGNOSIS — E1151 Type 2 diabetes mellitus with diabetic peripheral angiopathy without gangrene: Secondary | ICD-10-CM | POA: Diagnosis not present

## 2021-03-03 NOTE — Patient Instructions (Signed)
Visit Information  PATIENT GOALS: Goals Addressed            This Visit's Progress   . (RNCM) Monitor and Manage My Blood Sugar-Diabetes Type 1   On track    Timeframe:  Long-Range Goal Priority:  High Start Date:   12/06/20                          Expected End Date:  09/10/21                     Follow Up Date 03/12/2021    . Check blood sugar at least 5 times a day . Check blood sugar if I feel it is too high or too low . Take the blood sugar meter and log to all doctor visits  . Consider drinking Boost or other supplement in the evenings if not eating a complete meal . Make sure to have candy or glucose tablets on you at all times; try drinking coke along with eating snack to treat hypoglycemia . Eat at least 3 meals a day with snack in between to help prevent hypoglycemia . Please consider Home Health Nursing/Therapy involvement . Wear shoes (slides/bedroom shoes) while in the house; DO NOT GO BAREFOOT   Why is this important?    Checking your blood sugar at home helps to keep it from getting very high or very low.   Writing the results in a diary or log helps the doctor know how to care for you.   Your blood sugar log should have the time, the date and the results.   Also, write down the amount of insulin or other medicine you take.   Other information like what you ate, exercise done and how you were feeling will also be helpful..     Notes:     Marland Kitchen (RNCM) Track and Manage Fluids and Swelling-Heart Failure   Not on track    Timeframe:  Long-Range Goal Priority:  Medium Start Date:   12/06/20                          Expected End Date:  09/10/21                     Follow Up Date 03/12/21   . Call office if I gain more than 2 pounds in one day or 5 pounds in one week . Use salt in moderation, low salt heart healthy diabetic diet . Find scale in the home and verify it still works . Weigh myself daily and write in log for provider review (at least start to weigh a few  times a week working way up to daily) . Take medications as prescribed . Place walker in front of scale to help balance self while standing to weigh (please consider trying this to weigh yourself) . Ask sister to help you find your scale and verify if it is working properly   Why is this important?    It is important to check your weight daily and watch how much salt and liquids you have.   It will help you to manage your heart failure.    Notes:        Patient verbalizes understanding of instructions provided today and agrees to view in [place].   The care management team will reach out to the patient again over the next 30 business days.  [name] RN, MSN RN Care Management Coordinator [place] [phone] [email or web address]

## 2021-03-03 NOTE — Chronic Care Management (AMB) (Signed)
Chronic Care Management   CCM RN Visit Note  03/03/2021 Name: Gary [name] MRN: [id number] DOB: [birth year]  Subjective: Gary [name] is a 75 y.o. year old male who is a primary care patient of [name], MD. The care management team was consulted for assistance with disease management and care coordination needs.    Engaged with patient by telephone for follow up visit in response to provider referral for case management and/or care coordination services.   Consent to Services:  The patient was given information about Chronic Care Management services, agreed to services, and gave verbal consent prior to initiation of services.  Please see initial visit note for detailed documentation.   Patient agreed to services and verbal consent obtained.   Assessment: Review of patient past medical history, allergies, medications, health status, including review of consultants reports, laboratory and other test data, was performed as part of comprehensive evaluation and provision of chronic care management services.   SDOH (Social Determinants of Health) assessments and interventions performed:    CCM Care Plan  Allergies  Allergen Reactions  . Rofecoxib Nausea Only    Outpatient Encounter Medications as of 03/03/2021  Medication Sig  . carvedilol (COREG) 3.125 MG tablet Take 1 tablet by mouth 2 (two) times daily with a meal.  . Cholecalciferol (VITAMIN D3) 50 MCG (2000 UT) CAPS Take by mouth.  . ferrous sulfate 325 (65 FE) MG tablet Take 325 mg by mouth daily with breakfast.  . furosemide (LASIX) 40 MG tablet Take 40 mg by mouth 2 (two) times daily. 2 tablets in the morning 1 tablet in the afternoon as needed  . glucose 4 GM chewable tablet Chew 1 tablet (4 g total) by mouth once as needed for low blood sugar.  . insulin aspart (NOVOLOG) 100 UNIT/ML injection Basal rate 12am 0.8 units per hour, 9am 0.9 units per hour. (total basal insulin 20.7 units). Carbohydrate ratio 1 units for every  8gm of carbohydrate. Correction factor 1 units for every 50mg /dl over target cbg. Target CBG 80-120  . levothyroxine (SYNTHROID) 25 MCG tablet Take 25 mcg by mouth daily before breakfast.  . nystatin cream (MYCOSTATIN) APPLY TO AFFECTED AREA TWICE A DAY  . potassium chloride (KLOR-CON) 10 MEQ tablet TAKE 1 TABLET BY MOUTH TWICE DAILY FOR 3 DAYS THEN CONTINUE ONCE DAILY  . pravastatin (PRAVACHOL) 40 MG tablet Take 40 mg by mouth daily.   . vitamin B-12 (CYANOCOBALAMIN) 1000 MCG tablet Take 1,000 mcg by mouth daily.   Facility-Administered Encounter Medications as of 03/03/2021  Medication  . heparin lock flush 100 unit/mL  . sodium chloride flush (NS) 0.9 % injection 10 mL  . sodium chloride flush (NS) 0.9 % injection 10 mL    Patient Active Problem List   Diagnosis Date Noted  . Splinter of foot without infection 02/23/2021  . Arm laceration 02/09/2021  . Hypokalemia 10/24/2020  . Injury by nail 08/15/2020  . Shingles 07/24/2020  . Rash 07/02/2020  . Colon cancer screening 07/02/2020  . Sick sinus syndrome ([place]) 12/28/2019  . Acquired absence of kidney 09/13/2019  . Edema of lower extremity 09/13/2019  . 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 Proteinuria 09/13/2019  . Lower extremity pain, bilateral   . Diabetic foot ulcer associated with type 1 diabetes mellitus ([place])   . Acute on chronic systolic CHF (congestive heart failure) ([place])   . Cellulitis 08/18/2019  . Lower limb ulcer, calf (  [place]) 08/01/2019  . Left hip pain 07/16/2019  . Pain due to onychomycosis of toenails of both feet 04/03/2019  . Swelling of limb 02/07/2019  . Pancytopenia ([place]) 12/03/2018  . CKD (chronic kidney disease) stage 3, GFR 30-59 ml/min (H[facility]) 09/16/2018  . Left shoulder pain 09/16/2018  . Chronic cholecystitis 12/29/2017  . Graves disease 12/29/2017  . Peripheral neuropathy 12/29/2017  . S/p nephrectomy 12/29/2017  .  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 Symptomatic bradycardia 12/02/2017  . AKI (acute kidney injury) ([place]) 12/02/2017  . Chronic systolic CHF (congestive heart failure) ([place]) 12/02/2017  . Saturday night paralysis 11/12/2017  . Hypoglycemia 10/28/2017  . Goals of care, counseling/discussion 10/24/2017  . Lymphedema 10/20/2017  . Iron deficiency anemia 09/11/2017  . Fall 08/13/2017  . Humerus fracture 08/13/2017  . Anemia 01/12/2017  . Bilateral carotid artery stenosis 06/29/2016  . Hand laceration 12/16/2015  . Adjustment disorder with depressed mood 08/11/2015  . Cardiomyopathy, idiopathic ([place]) 04/22/2015  . CAD in native artery 11/02/2014  . Mantle cell lymphoma ([place]) 08/03/2014  . History of colonic polyps 04/29/2014  . Irregular heart beat 04/22/2014  . SOB (shortness of breath) 04/22/2014  . Stress 03/21/2014  . PVC (premature ventricular contraction) 02/21/2014  . GERD (gastroesophageal reflux disease) 10/23/2013  . Gastroparesis 06/25/2013  . Chest pain 04/13/2013  . Thyroid disease 04/13/2013  . Diabetes ([place]) 04/13/2013  . Essential hypertension, benign 04/13/2013  . Hypercholesterolemia 04/13/2013    Conditions to be addressed/monitored:CHF and DMII  Care Plan : Heart Failure (Adult)  Updates made by Leona Singleton, RN since 03/03/2021 12:00 AM  Problem: Disease Progression (Heart Failure)   Priority: Medium  Long-Range Goal: Patient will report no heart failure exacerbations in the next 90 days   Start Date: 12/06/2020  Expected End Date: 09/10/2021  This Visit's Progress: Not on track  Recent Progress: Not on track  Priority: Medium  Current Barriers:  Marland Kitchen Knowledge deficit related to basic heart failure pathophysiology and self care management as evidenced by continued lower extremity edema requiring Unna Boots (changed every Wednesday at Vascular office).  Patient continues to report he does not weigh himself at home.  States he weighs weekly at providers  office when he gets his unna boots changed.  States he has found walker but still cannot locate his home scale at this time.  Discussed importance of daily weight monitoring and encouraged patient to use home walker to help balance on his home scale.  Reports lower extremity edema is about the same (better than a year ago) and denies any increase in shortness of breath at this time. . Patient does not have transportation to provider appointments . Transportation Barriers; going to use [facility] transportation for appointments within [facility] system and using [employer] for other medical appointments . Lacks social connections . Unable to perform IADLs independently Case Manager Clinical Goal(s):  . patient will verbalize understanding of Heart Failure Action Plan and when to call doctor . patient will take all Heart Failure mediations as prescribed . patient will weigh daily and record (notifying MD of 3 lb weight gain over night or 5 lb in a week) Interventions:  . Collaboration with [name], MD regarding development and update of comprehensive plan of care as evidenced by provider attestation and co-signature . Inter-disciplinary care team collaboration (see longitudinal plan of care) . Provided verbal education on low sodium diet . Advised patient to weigh each morning after emptying bladder . Discussed importance of daily weight  and advised patient to weigh and record daily and encouraged patient to do so; Reinforced need to weight daily and encouraged patient to use walker in front of scale to help balance while standing on scale . Advised and encouraged patient to find home scale and make sure that it works, seeking assistance from sister to help him locate the scale . Congratulated on finding walker so it can be used to stand on scale, also encouraged to use walker with ambulation . Reviewed signs and symptoms of heart failure exacerbation . Encouraged elevation of lower extremities as  able . Encouraged continued use of [employer] and [employer] transportation . Discussed medications and encouraged medication compliance . Encouraged to use 2022 [facility] Calendar Booklet previously sent to help with logging of weights and other vital signs . Discussed importance of weighing at home and not just at providers office Patient Goals/Self-Care Activities . Call office if I gain more than 2 pounds in one day or 5 pounds in one week . Use salt in moderation, low salt heart healthy diabetic diet . Find scale in the home and verify it still works . Weigh myself daily and write in log for provider review (at least start to weigh a few times a week working way up to daily) . Take medications as prescribed . Place walker in front of scale to help balance self while standing to weigh (please consider trying this to weigh yourself) . Ask sister to help you find your scale and verify if it is working properly Follow Up Plan: The care management team will reach out to the patient again over the next 30 business days.    Care Plan : Diabetes  Updates made by Leona Singleton, RN since 03/03/2021 12:00 AM  Problem: Hypoglycemia causing syncopal episodes   Priority: Medium  Long-Range Goal: Patient will report maintaing Hgb A1C of 7 or below in the next 90 days   Start Date: 01/27/2021  Expected End Date: 09/10/2021  This Visit's Progress: On track  Recent Progress: On track  Priority: Medium  Objective:  Lab Results  Component Value Date   HGBA1C 7.0 01/23/2021  Current Barriers:  Marland Kitchen Knowledge Deficits related to basic Diabetes pathophysiology and self care/management as evidenced by multiple syncopal episodes related to hypoglycemia.  Patient reports fasting blood sugars have been ranging 120-180's, this morning fasting blood sugar was 168.  Does report some hypoglycemia with rates 50's right before dinner and having to suspend his insulin pump once in the last 2 weeks.  Patient  reporting right foot where splinter removed is healing, denies any drainage or signs and symptoms of infection.  Reports he is now wearing shoes in the house. . Limited Social Support . Trouble getting insulin pump supplies-reports he now has insulin pump sensors Case Manager Clinical Goal(s):  . patient will demonstrate improved adherence to prescribed treatment plan for diabetes self care/management as evidenced by: at least 4 times a day monitoring and recording of CBG, adherence to ADA/ carb modified diet, adherence to prescribed medication regimen, contacting provider for new or worsened symptoms or questions Interventions:  . Collaboration with [name], MD regarding development and update of comprehensive plan of care as evidenced by provider attestation and co-signature . Inter-disciplinary care team collaboration (see longitudinal plan of care) . Provided education to patient about basic DM disease process . Discussed medications and encouraged medication compliance . Discussed plans with patient for ongoing care management follow up and provided patient with  direct contact information for care management team . Encouraged continued participation with CCM Pharmacist  . Reviewed glucose ranges and hypoglycemic events; and discussed dangers of hypoglycemia and treatment options, need for immediate sugar replacement with juice, sugar, soda in addition to snack or candy bar or glucose tablets . Discussed proper treatment of hypoglycemia . Reviewed scheduled/upcoming provider appointments including: [facility] on 03/07/21, Endocrinology 7/19, PCP 7/19 . Discussed healthy meal and snack options . Rediscussed possible Home Health Nursing or Therapy referral for safety evaluation, educating and discussing even if they start services, patient can cancel at any time (patient still declines at this time, but states he will continue to think about it) . Congratulated patient on Hgb A1C at 7 . Discussed  logging blood sugars and writing down insulin doses taken to help provider manage medications; sent 2022 Calendar Booklet to help with keeping log of blood sugars and insulin doses . Reviewed and discussed proper diabetic foot care, encouraged patient to keep check on right foot splinter wound for signs and symptoms of infection; confirmed arm abrasion healed . Discussed and encouraged and reinforced patient to wear bedroom/house/slides while in the home and not to walk around bare foot . Discussed with patient asking podiatrist for prescription for diabetic shoes Patient Goals/Self-Care Activities . Check blood sugar at least 5 times a day . Check blood sugar if I feel it is too high or too low . Take the blood sugar meter and log to all doctor visits  . Consider drinking Boost or other supplement in the evenings if not eating a complete meal . Make sure to have candy or glucose tablets on you at all times; try drinking coke along with eating snack to treat hypoglycemia . Eat at least 3 meals a day with snack in between to help prevent hypoglycemia . Please consider Home Health Nursing/Therapy involvement . Wear shoes (slides/bedroom shoes) while in the house; DO NOT GO BAREFOOT Follow Up Plan: The care management team will reach out to the patient again over the next 30 business days.       Plan:The care management team will reach out to the patient again over the next 30 business days.  [name] RN, MSN RN Care Management Coordinator [place] [phone] [email or web address]

## 2021-03-06 ENCOUNTER — Ambulatory Visit (INDEPENDENT_AMBULATORY_CARE_PROVIDER_SITE_OTHER): Payer: Medicare Other | Admitting: Nurse Practitioner

## 2021-03-06 ENCOUNTER — Telehealth: Payer: Self-pay | Admitting: Internal Medicine

## 2021-03-06 ENCOUNTER — Other Ambulatory Visit: Payer: Self-pay

## 2021-03-06 ENCOUNTER — Encounter (INDEPENDENT_AMBULATORY_CARE_PROVIDER_SITE_OTHER): Payer: Self-pay

## 2021-03-06 VITALS — BP 127/71 | HR 79 | Resp 16 | Wt 234.0 lb

## 2021-03-06 DIAGNOSIS — I152 Hypertension secondary to endocrine disorders: Secondary | ICD-10-CM | POA: Diagnosis not present

## 2021-03-06 DIAGNOSIS — E1069 Type 1 diabetes mellitus with other specified complication: Secondary | ICD-10-CM | POA: Diagnosis not present

## 2021-03-06 DIAGNOSIS — R001 Bradycardia, unspecified: Secondary | ICD-10-CM | POA: Diagnosis not present

## 2021-03-06 DIAGNOSIS — Z6841 Body Mass Index (BMI) 40.0 and over, adult: Secondary | ICD-10-CM | POA: Diagnosis not present

## 2021-03-06 DIAGNOSIS — I890 Lymphedema, not elsewhere classified: Secondary | ICD-10-CM

## 2021-03-06 DIAGNOSIS — I6523 Occlusion and stenosis of bilateral carotid arteries: Secondary | ICD-10-CM | POA: Diagnosis not present

## 2021-03-06 DIAGNOSIS — I2510 Atherosclerotic heart disease of native coronary artery without angina pectoris: Secondary | ICD-10-CM | POA: Diagnosis not present

## 2021-03-06 DIAGNOSIS — E785 Hyperlipidemia, unspecified: Secondary | ICD-10-CM | POA: Diagnosis not present

## 2021-03-06 DIAGNOSIS — I495 Sick sinus syndrome: Secondary | ICD-10-CM | POA: Diagnosis not present

## 2021-03-06 DIAGNOSIS — E1159 Type 2 diabetes mellitus with other circulatory complications: Secondary | ICD-10-CM | POA: Diagnosis not present

## 2021-03-06 DIAGNOSIS — I5022 Chronic systolic (congestive) heart failure: Secondary | ICD-10-CM | POA: Diagnosis not present

## 2021-03-06 NOTE — Progress Notes (Signed)
History of Present Illness  There is no documented history at this time  Assessments & Plan   There are no diagnoses linked to this encounter.    Additional instructions  Subjective:  Patient presents with venous ulcer of the Bilateral lower extremity.    Procedure:  3 layer unna wrap was placed Bilateral lower extremity.   Plan:   Follow up in one week.  

## 2021-03-07 ENCOUNTER — Ambulatory Visit (INDEPENDENT_AMBULATORY_CARE_PROVIDER_SITE_OTHER): Payer: Medicare Other

## 2021-03-07 ENCOUNTER — Telehealth: Payer: Self-pay | Admitting: Internal Medicine

## 2021-03-07 VITALS — BP 115/70 | HR 64 | Resp 16 | Ht 69.0 in | Wt 236.2 lb

## 2021-03-07 DIAGNOSIS — Z0000 Encounter for general adult medical examination without abnormal findings: Secondary | ICD-10-CM

## 2021-03-07 NOTE — Telephone Encounter (Signed)
er

## 2021-03-07 NOTE — Progress Notes (Addendum)
Subjective:   Gary [name] is a 75 y.o. male who presents for Medicare Annual/Subsequent preventive examination.  Review of Systems    No ROS.  Medicare Wellness   Cardiac Risk Factors include: advanced age (>58men, >64 women);male gender;hypertension;diabetes mellitus     Objective:    Today's Vitals   03/07/21 0855  BP: 115/70  Pulse: 64  Resp: 16  SpO2: 100%  Weight: 236 lb 3.2 oz (107.1 kg)  Height: 5\' 9"  (1.753 m)   Body mass index is 34.88 kg/m.  Advanced Directives 03/07/2021 01/30/2021 11/27/2020 11/21/2020 07/31/2020 03/06/2020 01/26/2020  Does Patient Have a Medical Advance Directive? Yes No No No No No No  Type of Paramedic of [place];Living will - - - - - -  Does patient want to make changes to medical advance directive? No - Patient declined - - - - - -  Copy of [place] in Chart? No - copy requested - - - - - -  Would patient like information on creating a medical advance directive? - - No - Patient declined - No - Patient declined No - Patient declined No - Patient declined    Current Medications (verified) Outpatient Encounter Medications as of 03/07/2021  Medication Sig  . carvedilol (COREG) 3.125 MG tablet Take 1 tablet by mouth 2 (two) times daily with a meal.  . Cholecalciferol (VITAMIN D3) 50 MCG (2000 UT) CAPS Take by mouth.  . ferrous sulfate 325 (65 FE) MG tablet Take 325 mg by mouth daily with breakfast.  . furosemide (LASIX) 40 MG tablet Take 40 mg by mouth 2 (two) times daily. 2 tablets in the morning 1 tablet in the afternoon as needed  . glucose 4 GM chewable tablet Chew 1 tablet (4 g total) by mouth once as needed for low blood sugar.  . insulin aspart (NOVOLOG) 100 UNIT/ML injection Basal rate 12am 0.8 units per hour, 9am 0.9 units per hour. (total basal insulin 20.7 units). Carbohydrate ratio 1 units for every 8gm of carbohydrate. Correction factor 1 units for every 50mg /dl over target cbg. Target CBG  80-120  . levothyroxine (SYNTHROID) 25 MCG tablet Take 25 mcg by mouth daily before breakfast.  . nystatin cream (MYCOSTATIN) APPLY TO AFFECTED AREA TWICE A DAY  . potassium chloride (KLOR-CON) 10 MEQ tablet TAKE 1 TABLET BY MOUTH TWICE DAILY FOR 3 DAYS THEN CONTINUE ONCE DAILY  . pravastatin (PRAVACHOL) 40 MG tablet Take 40 mg by mouth daily.   . vitamin B-12 (CYANOCOBALAMIN) 1000 MCG tablet Take 1,000 mcg by mouth daily.   Facility-Administered Encounter Medications as of 03/07/2021  Medication  . heparin lock flush 100 unit/mL  . sodium chloride flush (NS) 0.9 % injection 10 mL  . sodium chloride flush (NS) 0.9 % injection 10 mL    Allergies (verified) Rofecoxib   History: Past Medical History:  Diagnosis Date  . CHF (congestive heart failure) ([place])   . Depression   . 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 Gastroparesis   . Graves disease   . Hypercholesterolemia   . Hypertension   . Mantle cell lymphoma ([place]) 07/2014  . Peripheral neuropathy   . Shingles    Past Surgical History:  Procedure Laterality Date  . CHOLECYSTECTOMY    . NEPHRECTOMY     right  . PACEMAKER INSERTION N/A 12/07/2017   Procedure: INSERTION PACEMAKER DUAL CHAMBER INITIAL INSERT;  Surgeon: Isaias Cowman, MD;  Location:  [facility] ORS;  Service: Cardiovascular;  Laterality: N/A;  . PORTA CATH INSERTION N/A 11/03/2017   Procedure: PORTA CATH INSERTION;  Surgeon: Katha Cabal, MD;  Location: [facility];  Service: Cardiovascular;  Laterality: N/A;   Family History  Problem Relation Age of Onset  . Breast cancer Mother   . Diabetes Father   . Cancer Father        Lymphoma  . Alcohol abuse Maternal Uncle   . Diabetes Sister   . Heart disease Other        grandfather   Social History   Socioeconomic History  . Marital status: Widowed    Spouse name: Not on file  . Number of children: 1  . Years of education: 67  . Highest  education level: 12th grade  Occupational History  . Occupation: Retired  Tobacco Use  . Smoking status: Former Research scientist (life sciences)  . Smokeless tobacco: Current User    Types: Chew  Vaping Use  . Vaping Use: Never used  Substance and Sexual Activity  . Alcohol use: Yes    Alcohol/week: 3.0 standard drinks    Types: 3 Cans of beer per week    Comment: nightly  . Drug use: Never  . Sexual activity: Not Currently  Other Topics Concern  . Not on file  Social History Narrative  . Not on file   Social Determinants of Health   Financial Resource Strain: Low Risk   . Difficulty of Paying Living Expenses: Not very hard  Food Insecurity: No Food Insecurity  . Worried About Charity fundraiser in the Last Year: Never true  . Ran Out of Food in the Last Year: Never true  Transportation Needs: No Transportation Needs  . Lack of Transportation (Medical): No  . Lack of Transportation (Non-Medical): No  Physical Activity: Inactive  . Days of Exercise per Week: 0 days  . Minutes of Exercise per Session: 0 min  Stress: Stress Concern Present  . Feeling of Stress : To some extent  Social Connections: Socially Isolated  . Frequency of Communication with Friends and Family: More than three times a week  . Frequency of Social Gatherings with Friends and Family: Twice a week  . Attends Religious Services: Never  . Active Member of Clubs or Organizations: No  . Attends Archivist Meetings: Never  . Marital Status: Widowed    Tobacco Counseling Ready to quit: Not Answered Counseling given: Not Answered   Clinical Intake:  Pre-visit preparation completed: Yes        Diabetes: Yes  How often do you need to have someone help you when you read instructions, pamphlets, or other written materials from your doctor or pharmacy?: 1 - Never  Nutrition Risk Assessment: Has the patient had any N/V/D within the last 2 months?  No  Does the patient have any non-healing wounds?  No  Has the  patient had any unintentional weight loss or weight gain?  No   Diabetes:  Is the patient diabetic?  Yes  If diabetic, was a CBG obtained today?  Yes , 165 Did the patient bring in their glucometer from home?  No  How often do you monitor your CBG's? Daily.   Financial Strains and Diabetes Management: Are you having any financial strains with the device, your supplies or your medication? No .  Does the patient want to be seen by Chronic Care Management for management of their diabetes?  No  Would the patient like to  be referred to a Nutritionist or for Diabetic Management?  No   Interpreter Needed?: No    Activities of Daily Living In your present state of health, do you have any difficulty performing the following activities: 03/07/2021 11/27/2020  Hearing? N N  Vision? N N  Difficulty concentrating or making decisions? - N  Walking or climbing stairs? Y N  Comment Unsteady gait.Cane in use when ambulating. -  Dressing or bathing? N N  Doing errands, shopping? Y N  Comment He does not drive Facilities manager and eating ? Y N  Comment Meals on wheels. Sister assist with meals. Uses microwave. Self feed. -  Using the Toilet? N N  In the past six months, have you accidently leaked urine? N N  Do you have problems with loss of bowel control? N N  Managing your Medications? N N  Managing your Finances? N N  Housekeeping or managing your Housekeeping? [place] is cluttered and unorganized.  Some recent data might be hidden    Patient Care Team: [name], MD as PCP - General (Internal Medicine) Lloyd Huger, MD as Consulting Physician (Oncology) Lucky Cowboy Erskine Squibb, MD as Referring Physician (Vascular Surgery) Schnier, Dolores Lory, MD (Vascular Surgery) Corey Skains, MD as Consulting Physician (Cardiology) De Hollingshead, RPH-[name] (Pharmacist) Leona Singleton, RN as Case Manager Saporito, Maree Erie, LCSW as Social Worker (Licensed Clinical  Social Worker)  Indicate any recent Medical Services you may have received from other than [facility] providers in the past year (date may be approximate).     Assessment:   This is a routine wellness examination for [place].  Hearing/Vision screen  Hearing Screening   125Hz  250Hz  500Hz  1000Hz  2000Hz  3000Hz  4000Hz  6000Hz  8000Hz   Right ear:           Left ear:           Comments: Patient is able to hear conversational tones without difficulty.  No issues reported.  Vision Screening Comments: Wears corrective lenses Cataracts extracted, bilateral No retinopathy  They have seen their ophthalmologist  Dietary issues and exercise activities discussed: Current Exercise Habits: The patient does not participate in regular exercise at present, Intensity: Mild  Low carb diet  Good water intake  Goals Addressed            This Visit's Progress   . Increase physical activity       Stay active Walk for exercise      Depression Screen PHQ 2/9 Scores 03/07/2021 12/06/2020 11/27/2020 06/24/2020 03/06/2020 09/04/2019 11/11/2017  PHQ - 2 Score 0 1 1 0 0 0 0  PHQ- 9 Score - - - 6 - - -    Fall Risk Fall Risk  03/07/2021 12/06/2020 11/27/2020 06/24/2020 05/30/2020  Falls in the past year? 0 1 1 1  0  Comment - - - - -  Number falls in past yr: 0 1 1 1  0  Injury with Fall? 0 1 1 1  0  Risk Factor Category  - - - - -  Risk for fall due to : - Medication side effect;Impaired vision;Impaired mobility;Impaired balance/gait;History of fall(s) History of fall(s);Impaired balance/gait;Impaired mobility - History of fall(s);Impaired balance/gait  Follow up Falls evaluation completed Falls evaluation completed;Education provided;Falls prevention discussed Education provided;Falls prevention discussed Falls evaluation completed Falls evaluation completed    FALL RISK PREVENTION PERTAINING TO THE HOME: Handrails in use when climbing stair? Yes Home free of loose throw rugs in walkways,  pet beds, electrical cords,  etc? Yes  Adequate lighting in your home to reduce risk of falls? Yes   ASSISTIVE DEVICES UTILIZED TO PREVENT FALLS:  Life alert? Yes  Use of a cane, walker or w/c? Yes  Grab bars in the bathroom? No  Shower chair or bench in shower? No  Elevated toilet seat or a handicapped toilet? Yes   TIMED UP AND GO: Was the test performed? Yes .  Length of time to ambulate 10 feet: 15 sec.   Gait slow and steady with assistive device  Cognitive Function: Patient is alert and oriented x3.  Enjoys reading and manages his own finances.   MMSE - Mini Mental State Exam 10/22/2016  Orientation to time 5  Orientation to Place 5  Registration 3  Attention/ Calculation 5  Recall 3  Language- name 2 objects 2  Language- repeat 1  Language- follow 3 step command 3  Language- read & follow direction 1  Write a sentence 1  Copy design 1  Total score 30     6CIT Screen 03/07/2021 03/06/2020 10/22/2017  What Year? 0 points 0 points 0 points  What month? 0 points 0 points 0 points  What time? 0 points 0 points 0 points  Count back from 20 0 points 0 points 0 points  Months in reverse 0 points 0 points 0 points  Repeat phrase 0 points 2 points 0 points  Total Score 0 2 0    Immunizations Immunization History  Administered Date(s) Administered  . Fluad Quad(high Dose 65+) 07/10/2019, 06/24/2020  . Influenza Split 07/13/2014  . Influenza, High Dose Seasonal PF 10/22/2016, 10/21/2017, 09/14/2018  . Influenza, Seasonal, Injecte, Preservative Fre 08/27/2008  . Influenza,inj,Quad PF,6+ Mos 06/20/2013, 08/09/2015  . Influenza-Unspecified 06/20/2013, 08/09/2015  . Pneumococcal Conjugate-13 12/26/2014  . Pneumococcal Polysaccharide-23 10/22/2017  . Tdap 07/14/2018    Covid vaccine- not yet completed. Unsure if he will receive.   Health Maintenance Health Maintenance  Topic Date Due  . URINE MICROALBUMIN  09/22/2017  . COVID-19 Vaccine (1) 09/11/2021 (Originally 07/07/1958)  . Zoster Vaccines-  Shingrix (1 of 2) 09/11/2022 (Originally 07/07/1998)  . OPHTHALMOLOGY EXAM  03/20/2021  . INFLUENZA VACCINE  05/12/2021  . HEMOGLOBIN A1C  07/25/2021  . FOOT EXAM  08/15/2021  . COLONOSCOPY (Pts 45-27yrs Insurance coverage will need to be confirmed)  01/09/2024  . TETANUS/TDAP  07/14/2028  . Hepatitis C Screening  Completed  . PNA vac Low Risk Adult  Completed  . HPV VACCINES  Aged Out    Colorectal cancer screening: Type of screening: Colonoscopy. Completed 12/3013. Repeat every 10 years  CT Chest WO Contrast complete 01/24/21.  Vision Screening: Recommended annual ophthalmology exams for early detection of glaucoma and other disorders of the eye. Is the patient up to date with their annual eye exam?  Yes   Dental Screening: Recommended annual dental exams for proper oral hygiene.   Community Resource Referral / Chronic Care Management: CRR required this visit?  No   CCM required this visit?  No      Plan:   Keep all routine maintenance appointments.   I have personally reviewed and noted the following in the patient's chart:   . Medical and social history . Use of alcohol, tobacco or illicit drugs  . Current medications and supplements including opioid prescriptions. Patient is not currently taking opioid prescriptions. . Functional ability and status . Nutritional status . Physical activity . Advanced directives . List of other physicians . Hospitalizations,  surgeries, and ER visits in previous 12 months . Vitals . Screenings to include cognitive, depression, and falls . Referrals and appointments  In addition, I have reviewed and discussed with patient certain preventive protocols, quality metrics, and best practice recommendations. A written personalized care plan for preventive services as well as general preventive health recommendations were provided to patient.     Varney Biles, LPN   6/75/1982

## 2021-03-07 NOTE — Patient Instructions (Addendum)
Gary [name] , Thank you for taking time to come for your Medicare Wellness Visit. I appreciate your ongoing commitment to your health goals. Please review the following plan we discussed and let me know if I can assist you in the future.   These are the goals we discussed: Goals      Patient Stated   .  Medication Monitoring (pt-stated)      Patient Goals/Self-Care Activities . Over the next 90 days, patient will:  - take medications as prescribed focus on medication adherence by using weekly pill box Continue to collaborate with interdisciplinary team      Other   .  (RNCM) Monitor and Manage My Blood Sugar-Diabetes Type 1      Timeframe:  Long-Range Goal Priority:  High Start Date:   12/06/20                          Expected End Date:  09/10/21                     Follow Up Date 03/12/2021    . Check blood sugar at least 5 times a day . Check blood sugar if I feel it is too high or too low . Take the blood sugar meter and log to all doctor visits  . Consider drinking Boost or other supplement in the evenings if not eating a complete meal . Make sure to have candy or glucose tablets on you at all times; try drinking coke along with eating snack to treat hypoglycemia . Eat at least 3 meals a day with snack in between to help prevent hypoglycemia . Please consider Home Health Nursing/Therapy involvement . Wear shoes (slides/bedroom shoes) while in the house; DO NOT GO BAREFOOT   Why is this important?    Checking your blood sugar at home helps to keep it from getting very high or very low.   Writing the results in a diary or log helps the doctor know how to care for you.   Your blood sugar log should have the time, the date and the results.   Also, write down the amount of insulin or other medicine you take.   Other information like what you ate, exercise done and how you were feeling will also be helpful..     Notes:     Marland Kitchen  (RNCM) Track and Manage Fluids and Swelling-Heart  Failure      Timeframe:  Long-Range Goal Priority:  Medium Start Date:   12/06/20                          Expected End Date:  09/10/21                     Follow Up Date 03/12/21   . Call office if I gain more than 2 pounds in one day or 5 pounds in one week . Use salt in moderation, low salt heart healthy diabetic diet . Find scale in the home and verify it still works . Weigh myself daily and write in log for provider review (at least start to weigh a few times a week working way up to daily) . Take medications as prescribed . Place walker in front of scale to help balance self while standing to weigh (please consider trying this to weigh yourself) . Ask sister to help you find your scale and verify  if it is working properly   Why is this important?    It is important to check your weight daily and watch how much salt and liquids you have.   It will help you to manage your heart failure.    Notes:     .  Increase physical activity      Stay active Walk for exercise        Immunizations Immunization History  Administered Date(s) Administered  . Fluad Quad(high Dose 65+) 07/10/2019, 06/24/2020  . Influenza Split 07/13/2014  . Influenza, High Dose Seasonal PF 10/22/2016, 10/21/2017, 09/14/2018  . Influenza, Seasonal, Injecte, Preservative Fre 08/27/2008  . Influenza,inj,Quad PF,6+ Mos 06/20/2013, 08/09/2015  . Influenza-Unspecified 06/20/2013, 08/09/2015  . Pneumococcal Conjugate-13 12/26/2014  . Pneumococcal Polysaccharide-23 10/22/2017  . Tdap 07/14/2018    This is a list of the screening recommended for you and due dates:  Health Maintenance  Topic Date Due  . Urine Protein Check  09/22/2017  . COVID-19 Vaccine (1) 09/11/2021*  . Zoster (Shingles) Vaccine (1 of 2) 09/11/2022*  . Eye exam for diabetics  03/20/2021  . Flu Shot  05/12/2021  . Hemoglobin A1C  07/25/2021  . Complete foot exam   08/15/2021  . Colon Cancer Screening  01/09/2024  . Tetanus Vaccine   07/14/2028  . Hepatitis C Screening: USPSTF Recommendation to screen - Ages 71-79 yo.  Completed  . Pneumonia vaccines  Completed  . HPV Vaccine  Aged Out  *Topic was postponed. The date shown is not the original due date.   Advanced directives: End of life planning; Advance aging; Advanced directives discussed.  Copy of current HCPOA/Living Will requested.    Conditions/risks identified: none new  Follow up in one year for your annual wellness visit.   Preventive Care 24 Years and Older, Male Preventive care refers to lifestyle choices and visits with your health care provider that can promote health and wellness. What does preventive care include?  A yearly physical exam. This is also called an annual well check.  Dental exams once or twice a year.  Routine eye exams. Ask your health care provider how often you should have your eyes checked.  Personal lifestyle choices, including:  Daily care of your teeth and gums.  Regular physical activity.  Eating a healthy diet.  Avoiding tobacco and drug use.  Limiting alcohol use.  Practicing safe sex.  Taking low doses of aspirin every day.  Taking vitamin and mineral supplements as recommended by your health care provider. What happens during an annual well check? The services and screenings done by your health care provider during your annual well check will depend on your age, overall health, lifestyle risk factors, and family history of disease. Counseling  Your health care provider may ask you questions about your:  Alcohol use.  Tobacco use.  Drug use.  Emotional well-being.  Home and relationship well-being.  Sexual activity.  Eating habits.  History of falls.  Memory and ability to understand (cognition).  Work and work Statistician. Screening  You may have the following tests or measurements:  Height, weight, and BMI.  Blood pressure.  Lipid and cholesterol levels. These may be checked every 5  years, or more frequently if you are over 52 years old.  Skin check.  Lung cancer screening. You may have this screening every year starting at age 23 if you have a 30-pack-year history of smoking and currently smoke or have quit within the past 15 years.  Fecal occult blood  test (FOBT) of the stool. You may have this test every year starting at age 6.  Flexible sigmoidoscopy or colonoscopy. You may have a sigmoidoscopy every 5 years or a colonoscopy every 10 years starting at age 65.  Prostate cancer screening. Recommendations will vary depending on your family history and other risks.  Hepatitis C blood test.  Hepatitis B blood test.  Sexually transmitted disease (STD) testing.  Diabetes screening. This is done by checking your blood sugar (glucose) after you have not eaten for a while (fasting). You may have this done every 1-3 years.  Abdominal aortic aneurysm (AAA) screening. You may need this if you are a current or former smoker.  Osteoporosis. You may be screened starting at age 50 if you are at high risk. Talk with your health care provider about your test results, treatment options, and if necessary, the need for more tests. Vaccines  Your health care provider may recommend certain vaccines, such as:  Influenza vaccine. This is recommended every year.  Tetanus, diphtheria, and acellular pertussis (Tdap, Td) vaccine. You may need a Td booster every 10 years.  Zoster vaccine. You may need this after age 39.  Pneumococcal 13-valent conjugate (PCV13) vaccine. One dose is recommended after age 81.  Pneumococcal polysaccharide (PPSV23) vaccine. One dose is recommended after age 74. Talk to your health care provider about which screenings and vaccines you need and how often you need them. This information is not intended to replace advice given to you by your health care provider. Make sure you discuss any questions you have with your health care provider. Document Released:  10/25/2015 Document Revised: 06/17/2016 Document Reviewed: 07/30/2015 Elsevier Interactive Patient Education  2017 [employer] Prevention in the Home Falls can cause injuries. They can happen to people of all ages. There are many things you can do to make your home safe and to help prevent falls. What can I do on the outside of my home?  Regularly fix the edges of walkways and driveways and fix any cracks.  Remove anything that might make you trip as you walk through a door, such as a raised step or threshold.  Trim any bushes or trees on the path to your home.  Use bright outdoor lighting.  Clear any walking paths of anything that might make someone trip, such as rocks or tools.  Regularly check to see if handrails are loose or broken. Make sure that both sides of any steps have handrails.  Any raised decks and porches should have guardrails on the edges.  Have any leaves, snow, or ice cleared regularly.  Use sand or salt on walking paths during winter.  Clean up any spills in your garage right away. This includes oil or grease spills. What can I do in the bathroom?  Use night lights.  Install grab bars by the toilet and in the tub and shower. Do not use towel bars as grab bars.  Use non-skid mats or decals in the tub or shower.  If you need to sit down in the shower, use a plastic, non-slip stool.  Keep the floor dry. Clean up any water that spills on the floor as soon as it happens.  Remove soap buildup in the tub or shower regularly.  Attach bath mats securely with double-sided non-slip rug tape.  Do not have throw rugs and other things on the floor that can make you trip. What can I do in the bedroom?  Use night lights.  Make  sure that you have a light by your bed that is easy to reach.  Do not use any sheets or blankets that are too big for your bed. They should not hang down onto the floor.  Have a firm chair that has side arms. You can use this for  support while you get dressed.  Do not have throw rugs and other things on the floor that can make you trip. What can I do in the kitchen?  Clean up any spills right away.  Avoid walking on wet floors.  Keep items that you use a lot in easy-to-reach places.  If you need to reach something above you, use a strong step stool that has a grab bar.  Keep electrical cords out of the way.  Do not use floor polish or wax that makes floors slippery. If you must use wax, use non-skid floor wax.  Do not have throw rugs and other things on the floor that can make you trip. What can I do with my stairs?  Do not leave any items on the stairs.  Make sure that there are handrails on both sides of the stairs and use them. Fix handrails that are broken or loose. Make sure that handrails are as long as the stairways.  Check any carpeting to make sure that it is firmly attached to the stairs. Fix any carpet that is loose or worn.  Avoid having throw rugs at the top or bottom of the stairs. If you do have throw rugs, attach them to the floor with carpet tape.  Make sure that you have a light switch at the top of the stairs and the bottom of the stairs. If you do not have them, ask someone to add them for you. What else can I do to help prevent falls?  Wear shoes that:  Do not have high heels.  Have rubber bottoms.  Are comfortable and fit you well.  Are closed at the toe. Do not wear sandals.  If you use a stepladder:  Make sure that it is fully opened. Do not climb a closed stepladder.  Make sure that both sides of the stepladder are locked into place.  Ask someone to hold it for you, if possible.  Clearly mark and make sure that you can see:  Any grab bars or handrails.  First and last steps.  Where the edge of each step is.  Use tools that help you move around (mobility aids) if they are needed. These include:  Canes.  Walkers.  Scooters.  Crutches.  Turn on the  lights when you go into a dark area. Replace any light bulbs as soon as they burn out.  Set up your furniture so you have a clear path. Avoid moving your furniture around.  If any of your floors are uneven, fix them.  If there are any pets around you, be aware of where they are.  Review your medicines with your doctor. Some medicines can make you feel dizzy. This can increase your chance of falling. Ask your doctor what other things that you can do to help prevent falls. This information is not intended to replace advice given to you by your health care provider. Make sure you discuss any questions you have with your health care provider. Document Released: 07/25/2009 Document Revised: 03/05/2016 Document Reviewed: 11/02/2014 Elsevier Interactive Patient Education  2017 [employer].

## 2021-03-09 ENCOUNTER — Encounter (INDEPENDENT_AMBULATORY_CARE_PROVIDER_SITE_OTHER): Payer: Self-pay | Admitting: Nurse Practitioner

## 2021-03-12 ENCOUNTER — Ambulatory Visit (INDEPENDENT_AMBULATORY_CARE_PROVIDER_SITE_OTHER): Payer: Medicare Other | Admitting: *Deleted

## 2021-03-12 DIAGNOSIS — E1151 Type 2 diabetes mellitus with diabetic peripheral angiopathy without gangrene: Secondary | ICD-10-CM

## 2021-03-12 DIAGNOSIS — I5022 Chronic systolic (congestive) heart failure: Secondary | ICD-10-CM

## 2021-03-12 NOTE — Chronic Care Management (AMB) (Signed)
Chronic Care Management   CCM RN Visit Note  03/12/2021 Name: Gary [name] MRN: [id number] DOB: 1946/02/12  Subjective: Gary [name] is a 75 y.o. year old male who is a primary care patient of [name], MD. The care management team was consulted for assistance with disease management and care coordination needs.    Engaged with patient by telephone for follow up visit in response to provider referral for case management and/or care coordination services.   Consent to Services:  The patient was given information about Chronic Care Management services, agreed to services, and gave verbal consent prior to initiation of services.  Please see initial visit note for detailed documentation.   Patient agreed to services and verbal consent obtained.   Assessment: Review of patient past medical history, allergies, medications, health status, including review of consultants reports, laboratory and other test data, was performed as part of comprehensive evaluation and provision of chronic care management services.   SDOH (Social Determinants of Health) assessments and interventions performed:    CCM Care Plan  Allergies  Allergen Reactions  . Rofecoxib Nausea Only    Outpatient Encounter Medications as of 03/12/2021  Medication Sig  . insulin aspart (NOVOLOG) 100 UNIT/ML injection Basal rate 12am 0.8 units per hour, 9am 0.9 units per hour. (total basal insulin 20.7 units). Carbohydrate ratio 1 units for every 8gm of carbohydrate. Correction factor 1 units for every 50mg /dl over target cbg. Target CBG 80-120  . carvedilol (COREG) 3.125 MG tablet Take 1 tablet by mouth 2 (two) times daily with a meal.  . Cholecalciferol (VITAMIN D3) 50 MCG (2000 UT) CAPS Take by mouth.  . ferrous sulfate 325 (65 FE) MG tablet Take 325 mg by mouth daily with breakfast.  . furosemide (LASIX) 40 MG tablet Take 40 mg by mouth 2 (two) times daily. 2 tablets in the morning 1 tablet in the afternoon as needed  .  glucose 4 GM chewable tablet Chew 1 tablet (4 g total) by mouth once as needed for low blood sugar.  . levothyroxine (SYNTHROID) 25 MCG tablet Take 25 mcg by mouth daily before breakfast.  . nystatin cream (MYCOSTATIN) APPLY TO AFFECTED AREA TWICE A DAY  . potassium chloride (KLOR-CON) 10 MEQ tablet TAKE 1 TABLET BY MOUTH TWICE DAILY FOR 3 DAYS THEN CONTINUE ONCE DAILY  . pravastatin (PRAVACHOL) 40 MG tablet Take 40 mg by mouth daily.   . vitamin B-12 (CYANOCOBALAMIN) 1000 MCG tablet Take 1,000 mcg by mouth daily.   Facility-Administered Encounter Medications as of 03/12/2021  Medication  . heparin lock flush 100 unit/mL  . sodium chloride flush (NS) 0.9 % injection 10 mL  . sodium chloride flush (NS) 0.9 % injection 10 mL    Patient Active Problem List   Diagnosis Date Noted  . Splinter of foot without infection 02/23/2021  . Arm laceration 02/09/2021  . Hypokalemia 10/24/2020  . Injury by nail 08/15/2020  . Shingles 07/24/2020  . Rash 07/02/2020  . Colon cancer screening 07/02/2020  . Sick sinus syndrome ([place]) 12/28/2019  . Acquired absence of kidney 09/13/2019  . Edema of lower extremity 09/13/2019  . 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 Proteinuria 09/13/2019  . Lower extremity pain, bilateral   . Diabetic foot ulcer associated with type 1 diabetes mellitus ([place])   . Acute on chronic systolic CHF (congestive heart failure) ([place])   . Cellulitis 08/18/2019  . Lower limb ulcer, calf (  [place]) 08/01/2019  . Left hip pain 07/16/2019  . Pain due to onychomycosis of toenails of both feet 04/03/2019  . Swelling of limb 02/07/2019  . Pancytopenia ([place]) 12/03/2018  . CKD (chronic kidney disease) stage 3, GFR 30-59 ml/min (HCC) 09/16/2018  . Left shoulder pain 09/16/2018  . Chronic cholecystitis 12/29/2017  . Graves disease 12/29/2017  . Peripheral neuropathy 12/29/2017  . S/p nephrectomy 12/29/2017  .  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 Symptomatic bradycardia 12/02/2017  . AKI (acute kidney injury) ([place]) 12/02/2017  . Chronic systolic CHF (congestive heart failure) ([place]) 12/02/2017  . Saturday night paralysis 11/12/2017  . Hypoglycemia 10/28/2017  . Goals of care, counseling/discussion 10/24/2017  . Lymphedema 10/20/2017  . Iron deficiency anemia 09/11/2017  . Fall 08/13/2017  . Humerus fracture 08/13/2017  . Anemia 01/12/2017  . Bilateral carotid artery stenosis 06/29/2016  . Hand laceration 12/16/2015  . Adjustment disorder with depressed mood 08/11/2015  . Cardiomyopathy, idiopathic ([place]) 04/22/2015  . CAD in native artery 11/02/2014  . Mantle cell lymphoma ([place]) 08/03/2014  . History of colonic polyps 04/29/2014  . Irregular heart beat 04/22/2014  . SOB (shortness of breath) 04/22/2014  . Stress 03/21/2014  . PVC (premature ventricular contraction) 02/21/2014  . GERD (gastroesophageal reflux disease) 10/23/2013  . Gastroparesis 06/25/2013  . Chest pain 04/13/2013  . Thyroid disease 04/13/2013  . Diabetes ([place]) 04/13/2013  . Essential hypertension, benign 04/13/2013  . Hypercholesterolemia 04/13/2013    Conditions to be addressed/monitored:CHF and DMII  Care Plan : Heart Failure (Adult)  Updates made by Leona Singleton, RN since 03/12/2021 12:00 AM  Problem: Disease Progression (Heart Failure)   Priority: Medium  Long-Range Goal: Patient will report no heart failure exacerbations in the next 90 days   Start Date: 12/06/2020  Expected End Date: 09/10/2021  This Visit's Progress: Not on track  Recent Progress: Not on track  Priority: Medium  Current Barriers:  Marland Kitchen Knowledge deficit related to basic heart failure pathophysiology and self care management as evidenced by continued lower extremity edema requiring Unna Boots (changed every Wednesday at Vascular office).  Patient continues to report he does not weigh himself at home.  States he weighs weekly at providers  office when he gets his unna boots changed.  States he still cannot locate his home scale at this time.  Discussed importance of daily weight monitoring and encouraged patient to use home walker to help balance on his home scale.  Reports lower extremity edema is about the same (better than a year ago) and denies any increase in shortness of breath at this time. . Patient does not have transportation to provider appointments . Transportation Barriers; going to use [facility] transportation for appointments within [place] system and using [employer] for other medical appointments . Lacks social connections . Unable to perform IADLs independently Case Manager Clinical Goal(s):  . patient will verbalize understanding of Heart Failure Action Plan and when to call doctor . patient will take all Heart Failure mediations as prescribed . patient will weigh daily and record (notifying MD of 3 lb weight gain over night or 5 lb in a week) Interventions:  . Collaboration with [name], MD regarding development and update of comprehensive plan of care as evidenced by provider attestation and co-signature . Inter-disciplinary care team collaboration (see longitudinal plan of care) . Provided verbal education on low sodium diet . Advised patient to weigh each morning after emptying bladder . Discussed importance of daily weight and advised patient to  weigh and record daily and encouraged patient to do so; Reinforced need to weight daily and encouraged patient to use walker in front of scale to help balance while standing on scale . Advised and encouraged patient to find home scale and make sure that it works, seeking assistance from sister to help him locate the scale . Congratulated on finding walker so it can be used to stand on scale, also encouraged to use walker with ambulation . Reviewed signs and symptoms of heart failure exacerbation . Encouraged elevation of lower extremities as able . Encouraged  continued use of [employer] and [employer] transportation . Discussed medications and encouraged medication compliance . Encouraged to use 2022 [facility] Calendar Booklet previously sent to help with logging of weights and other vital signs . Discussed importance of weighing at home and not just at providers office . Encouraged to use walker with all ambulation, fall precautions and preventions reviewed and discussed Patient Goals/Self-Care Activities . Call office if I gain more than 2 pounds in one day or 5 pounds in one week . Use salt in moderation, low salt heart healthy diabetic diet . Find scale in the home and verify it still works . Weigh myself daily and write in log for provider review (at least start to weigh a few times a week working way up to daily) . Take medications as prescribed . Place walker in front of scale to help balance self while standing to weigh (please consider trying this to weigh yourself) . Ask sister to help you find your scale and verify if it is working properly Follow Up Plan: The care management team will reach out to the patient again over the next 20 business days.    Care Plan : Diabetes  Updates made by Leona Singleton, RN since 03/12/2021 12:00 AM  Problem: Hypoglycemia causing syncopal episodes   Priority: Medium  Long-Range Goal: Patient will report maintaing Hgb A1C of 7 or below in the next 90 days   Start Date: 01/27/2021  Expected End Date: 09/10/2021  This Visit's Progress: On track  Recent Progress: On track  Priority: Medium  Objective:  Lab Results  Component Value Date   HGBA1C 7.0 01/23/2021  Current Barriers:  Marland Kitchen Knowledge Deficits related to basic Diabetes pathophysiology and self care/management as evidenced by multiple syncopal episodes related to hypoglycemia.  Patient reports hyperglycemia followed by hypoglycemia yesterday.  States he had blood sugar of 350 where he gave himself extra insulin and then blood sugar down to 50.   States he treated hypoglycemia all night and turned insulin pump off.  Turned pump back on this morning with this mornings blood sugar was 325.  Blood sugar at lunch was down to 120 and now 105.  Does report recent fasting ranges have been 110-220.  States his foot were he removed splinter is without signs and symptoms of infection. . Limited Social Support . Trouble getting insulin pump supplies-reports he now has insulin pump sensors Case Manager Clinical Goal(s):  . patient will demonstrate improved adherence to prescribed treatment plan for diabetes self care/management as evidenced by: at least 4 times a day monitoring and recording of CBG, adherence to ADA/ carb modified diet, adherence to prescribed medication regimen, contacting provider for new or worsened symptoms or questions Interventions:  . Collaboration with [name], MD regarding development and update of comprehensive plan of care as evidenced by provider attestation and co-signature . Inter-disciplinary care team collaboration (see longitudinal plan of care) .  Provided education to patient about basic DM disease process . Discussed medications and encouraged medication compliance . Discussed plans with patient for ongoing care management follow up and provided patient with direct contact information for care management team . Encouraged continued participation with CCM Pharmacist  . Reviewed glucose ranges and hypoglycemic events; and discussed dangers of hypoglycemia and treatment options, need for immediate sugar replacement with juice, sugar, soda in addition to snack or candy bar or glucose tablets . Discussed proper treatment of hypoglycemia . Reviewed scheduled/upcoming provider appointments including:  Endocrinology 7/19, PCP 7/19 . Discussed healthy meal and snack options . Rediscussed possible Home Health Nursing or Therapy referral for safety evaluation, educating and discussing even if they start services, patient  can cancel at any time (patient still declines at this time, but states he will continue to think about it) . Discussed logging blood sugars and writing down insulin doses taken to help provider manage medications; sent 2022 Calendar Booklet to help with keeping log of blood sugars and insulin doses . Reviewed and discussed proper diabetic foot care, encouraged patient to keep check on right foot splinter wound for signs and symptoms of infection; confirmed arm abrasion healed . Discussed and encouraged and reinforced patient to wear bedroom/house/slides while in the home and not to walk around bare foot . Discussed with patient asking podiatrist for prescription for diabetic shoes . Encouraged to contact Endocrinology if he has to turn insulin pump off again for hypoglycemia . Encouraged patient to have staff changing unna boots check foot for infection after splinter removal Patient Goals/Self-Care Activities . Check blood sugar at least 5 times a day . Check blood sugar if I feel it is too high or too low . Take the blood sugar meter and log to all doctor visits  . Consider drinking Boost or other supplement in the evenings if not eating a complete meal . Make sure to have candy or glucose tablets on you at all times; try drinking coke along with eating snack to treat hypoglycemia . Eat at least 3 meals a day with snack in between to help prevent hypoglycemia . Please consider Home Health Nursing/Therapy involvement . Wear shoes (slides/bedroom shoes) while in the house; DO NOT GO BAREFOOT Follow Up Plan: The care management team will reach out to the patient again over the next 20 business days.       Plan:The care management team will reach out to the patient again over the next 20 business days.  [name] RN, MSN RN Care Management Coordinator [place] [phone] [email or web address]

## 2021-03-12 NOTE — Patient Instructions (Signed)
Visit Information  PATIENT GOALS: Goals Addressed            This Visit's Progress   . (RNCM) Monitor and Manage My Blood Sugar-Diabetes Type 1   On track    Timeframe:  Long-Range Goal Priority:  High Start Date:   12/06/20                          Expected End Date:  09/10/21                     Follow Up Date 03/26/2021    . Check blood sugar at least 5 times a day . Check blood sugar if I feel it is too high or too low . Take the blood sugar meter and log to all doctor visits  . Consider drinking Boost or other supplement in the evenings if not eating a complete meal . Make sure to have candy or glucose tablets on you at all times; try drinking coke along with eating snack to treat hypoglycemia . Eat at least 3 meals a day with snack in between to help prevent hypoglycemia . Please consider Home Health Nursing/Therapy involvement . Wear shoes (slides/bedroom shoes) while in the house; DO NOT GO BAREFOOT   Why is this important?    Checking your blood sugar at home helps to keep it from getting very high or very low.   Writing the results in a diary or log helps the doctor know how to care for you.   Your blood sugar log should have the time, the date and the results.   Also, write down the amount of insulin or other medicine you take.   Other information like what you ate, exercise done and how you were feeling will also be helpful..     Notes:     Marland Kitchen (RNCM) Track and Manage Fluids and Swelling-Heart Failure   Not on track    Timeframe:  Long-Range Goal Priority:  Medium Start Date:   12/06/20                          Expected End Date:  09/10/21                     Follow Up Date 03/26/21   . Call office if I gain more than 2 pounds in one day or 5 pounds in one week . Use salt in moderation, low salt heart healthy diabetic diet . Find scale in the home and verify it still works . Weigh myself daily and write in log for provider review (at least start to weigh a few  times a week working way up to daily) . Take medications as prescribed . Place walker in front of scale to help balance self while standing to weigh (please consider trying this to weigh yourself) . Ask sister to help you find your scale and verify if it is working properly   Why is this important?    It is important to check your weight daily and watch how much salt and liquids you have.   It will help you to manage your heart failure.    Notes:        Patient verbalizes understanding of instructions provided today and agrees to view in [place].   The care management team will reach out to the patient again over the next 20 business days.  [name] RN, MSN RN Care Management Coordinator [place] [phone] [email or web address]

## 2021-03-13 ENCOUNTER — Inpatient Hospital Stay: Payer: Medicare Other | Attending: Oncology

## 2021-03-13 DIAGNOSIS — Z452 Encounter for adjustment and management of vascular access device: Secondary | ICD-10-CM | POA: Insufficient documentation

## 2021-03-13 DIAGNOSIS — Z8572 Personal history of non-Hodgkin lymphomas: Secondary | ICD-10-CM | POA: Insufficient documentation

## 2021-03-13 DIAGNOSIS — Z95828 Presence of other vascular implants and grafts: Secondary | ICD-10-CM

## 2021-03-13 MED ORDER — HEPARIN SOD (PORK) LOCK FLUSH 100 UNIT/ML IV SOLN
INTRAVENOUS | Status: AC
  Filled 2021-03-13: qty 5

## 2021-03-13 MED ORDER — HEPARIN SOD (PORK) LOCK FLUSH 100 UNIT/ML IV SOLN
500.0000 [IU] | Freq: Once | INTRAVENOUS | Status: AC
  Administered 2021-03-13: 500 [IU] via INTRAVENOUS
  Filled 2021-03-13: qty 5

## 2021-03-13 MED ORDER — SODIUM CHLORIDE 0.9% FLUSH
10.0000 mL | INTRAVENOUS | Status: DC | PRN
  Administered 2021-03-13: 10 mL via INTRAVENOUS
  Filled 2021-03-13: qty 10

## 2021-03-17 ENCOUNTER — Other Ambulatory Visit: Payer: Self-pay | Admitting: Internal Medicine

## 2021-03-20 ENCOUNTER — Other Ambulatory Visit: Payer: Self-pay

## 2021-03-20 ENCOUNTER — Ambulatory Visit (INDEPENDENT_AMBULATORY_CARE_PROVIDER_SITE_OTHER): Payer: Medicare Other | Admitting: Nurse Practitioner

## 2021-03-20 ENCOUNTER — Encounter (INDEPENDENT_AMBULATORY_CARE_PROVIDER_SITE_OTHER): Payer: Self-pay

## 2021-03-20 VITALS — BP 131/71 | HR 78 | Resp 16 | Wt 232.2 lb

## 2021-03-20 DIAGNOSIS — I890 Lymphedema, not elsewhere classified: Secondary | ICD-10-CM

## 2021-03-20 NOTE — Progress Notes (Signed)
History of Present Illness  There is no documented history at this time  Assessments & Plan   There are no diagnoses linked to this encounter.    Additional instructions  Subjective:  Patient presents with venous ulcer of the Bilateral lower extremity.    Procedure:  3 layer unna wrap was placed Bilateral lower extremity.   Plan:   Follow up in one week.  

## 2021-03-23 ENCOUNTER — Encounter (INDEPENDENT_AMBULATORY_CARE_PROVIDER_SITE_OTHER): Payer: Self-pay | Admitting: Nurse Practitioner

## 2021-03-24 DIAGNOSIS — E119 Type 2 diabetes mellitus without complications: Secondary | ICD-10-CM | POA: Diagnosis not present

## 2021-03-24 LAB — HM DIABETES EYE EXAM

## 2021-03-26 ENCOUNTER — Ambulatory Visit: Payer: Medicare Other | Admitting: *Deleted

## 2021-03-26 DIAGNOSIS — E1151 Type 2 diabetes mellitus with diabetic peripheral angiopathy without gangrene: Secondary | ICD-10-CM

## 2021-03-26 DIAGNOSIS — I5022 Chronic systolic (congestive) heart failure: Secondary | ICD-10-CM

## 2021-03-26 NOTE — Chronic Care Management (AMB) (Signed)
Chronic Care Management   CCM RN Visit Note  03/26/2021 Name: Gary [name] MRN: [id number] DOB: [birth year]  Subjective: Gary [name] is a 75 y.o. year old male who is a primary care patient of [name], MD. The care management team was consulted for assistance with disease management and care coordination needs.    Engaged with patient by telephone for follow up visit in response to provider referral for case management and/or care coordination services.   Consent to Services:  The patient was given information about Chronic Care Management services, agreed to services, and gave verbal consent prior to initiation of services.  Please see initial visit note for detailed documentation.   Patient agreed to services and verbal consent obtained.   Assessment: Review of patient past medical history, allergies, medications, health status, including review of consultants reports, laboratory and other test data, was performed as part of comprehensive evaluation and provision of chronic care management services.   SDOH (Social Determinants of Health) assessments and interventions performed:    CCM Care Plan  Allergies  Allergen Reactions   Rofecoxib Nausea Only    Outpatient Encounter Medications as of 03/26/2021  Medication Sig   carvedilol (COREG) 3.125 MG tablet Take 1 tablet by mouth 2 (two) times daily with a meal.   Cholecalciferol (VITAMIN D3) 50 MCG (2000 UT) CAPS Take by mouth.   ferrous sulfate 325 (65 FE) MG tablet Take 325 mg by mouth daily with breakfast.   furosemide (LASIX) 40 MG tablet Take 40 mg by mouth 2 (two) times daily. 2 tablets in the morning 1 tablet in the afternoon as needed   insulin aspart (NOVOLOG) 100 UNIT/ML injection Basal rate 12am 0.8 units per hour, 9am 0.9 units per hour. (total basal insulin 20.7 units). Carbohydrate ratio 1 units for every 8gm of carbohydrate. Correction factor 1 units for every 50mg /dl over target cbg. Target CBG 80-120    levothyroxine (SYNTHROID) 25 MCG tablet Take 25 mcg by mouth daily before breakfast.   potassium chloride (KLOR-CON) 10 MEQ tablet TAKE 1 TABLET BY MOUTH TWICE DAILY FOR 3 DAYS THEN CONTINUE ONCE DAILY   pravastatin (PRAVACHOL) 40 MG tablet Take 40 mg by mouth daily.    vitamin B-12 (CYANOCOBALAMIN) 1000 MCG tablet Take 1,000 mcg by mouth daily.   glucose 4 GM chewable tablet Chew 1 tablet (4 g total) by mouth once as needed for low blood sugar.   nystatin cream (MYCOSTATIN) APPLY TO AFFECTED AREA TWICE A DAY   Facility-Administered Encounter Medications as of 03/26/2021  Medication   heparin lock flush 100 unit/mL   sodium chloride flush (NS) 0.9 % injection 10 mL   sodium chloride flush (NS) 0.9 % injection 10 mL    Patient Active Problem List   Diagnosis Date Noted   Splinter of foot without infection 02/23/2021   Arm laceration 02/09/2021   Hypokalemia 10/24/2020   Injury by nail 08/15/2020   Shingles 07/24/2020   Rash 07/02/2020   Colon cancer screening 07/02/2020   Sick sinus syndrome ([place]) 12/28/2019   Acquired absence of kidney 09/13/2019   Edema of lower extremity 09/13/2019   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, or unspecified 09/13/2019   Proteinuria 09/13/2019   Lower extremity pain, bilateral    Diabetic foot ulcer associated with type 1 diabetes mellitus ([place])    Acute on chronic systolic CHF (congestive heart failure) ([place])    Cellulitis 08/18/2019   Lower limb ulcer, calf (  [place]) 08/01/2019   Left hip pain 07/16/2019   Pain due to onychomycosis of toenails of both feet 04/03/2019   Swelling of limb 02/07/2019   Pancytopenia ([place]) 12/03/2018   CKD (chronic kidney disease) stage 3, GFR 30-59 ml/min (HCC) 09/16/2018   Left shoulder pain 09/16/2018   Chronic cholecystitis 12/29/2017   Graves disease 12/29/2017   Peripheral neuropathy 12/29/2017   S/p nephrectomy 12/29/2017   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    Symptomatic bradycardia 12/02/2017   AKI (acute kidney injury) ([place]) 95/18/8416   Chronic systolic CHF (congestive heart failure) ([place]) 12/02/2017   Saturday night paralysis 11/12/2017   Hypoglycemia 10/28/2017   Goals of care, counseling/discussion 10/24/2017   Lymphedema 10/20/2017   Iron deficiency anemia 09/11/2017   Fall 08/13/2017   Humerus fracture 08/13/2017   Anemia 01/12/2017   Bilateral carotid artery stenosis 06/29/2016   Hand laceration 12/16/2015   Adjustment disorder with depressed mood 08/11/2015   Cardiomyopathy, idiopathic ([place]) 04/22/2015   CAD in native artery 11/02/2014   Mantle cell lymphoma ([place]) 08/03/2014   History of colonic polyps 04/29/2014   Irregular heart beat 04/22/2014   SOB (shortness of breath) 04/22/2014   Stress 03/21/2014   PVC (premature ventricular contraction) 02/21/2014   GERD (gastroesophageal reflux disease) 10/23/2013   Gastroparesis 06/25/2013   Chest pain 04/13/2013   Thyroid disease 04/13/2013   Diabetes ([place]) 04/13/2013   Essential hypertension, benign 04/13/2013   Hypercholesterolemia 04/13/2013    Conditions to be addressed/monitored:CHF and DMII  Care Plan : Heart Failure (Adult)  Updates made by Leona Singleton, RN since 03/26/2021 12:00 AM     Problem: Disease Progression (Heart Failure)   Priority: Medium     Long-Range Goal: Patient will report no heart failure exacerbations in the next 90 days   Start Date: 12/06/2020  Expected End Date: 09/10/2021  This Visit's Progress: Not on track  Recent Progress: Not on track  Priority: Medium  Note:   Current Barriers:  Knowledge deficit related to basic heart failure pathophysiology and self care management as evidenced by continued lower extremity edema requiring Unna Boots (changed every Thursday at Vascular office).  Patient continues to report he does not weigh himself at home.  States he weighs weekly at providers office when he gets his unna boots changed.   States he still cannot locate his home scale at this time.  Discussed importance of daily weight monitoring and encouraged patient to use home walker to help balance on his home scale.  Reports lower extremity edema is about the same (better than a year ago) and denies any increase in shortness of breath at this time. Patient does not have transportation to provider appointments Transportation Barriers; going to use [facility] transportation for appointments within [facility] system and using [employer] for other medical appointments Lacks social connections Unable to perform IADLs independently Case Manager Clinical Goal(s):  patient will verbalize understanding of Heart Failure Action Plan and when to call doctor patient will take all Heart Failure mediations as prescribed patient will weigh daily and record (notifying MD of 3 lb weight gain over night or 5 lb in a week) Interventions:  Collaboration with [name], MD regarding development and update of comprehensive plan of care as evidenced by provider attestation and co-signature Inter-disciplinary care team collaboration (see longitudinal plan of care) Provided verbal education on low sodium diet Advised patient to weigh each morning after emptying bladder Discussed importance of daily weight and advised patient to weigh and record daily  and encouraged patient to do so; Reinforced need to weight daily and encouraged patient to use walker in front of scale to help balance while standing on scale Advised and encouraged patient to find home scale and make sure that it works, seeking assistance from sister to help him locate the scale Congratulated on finding walker so it can be used to stand on scale, also encouraged to use walker with ambulation Reviewed signs and symptoms of heart failure exacerbation Encouraged elevation of lower extremities as able Encouraged continued use of [facility] and [employer] transportation Discussed  medications and encouraged medication compliance Encouraged to use 2022 [place] previously sent to help with logging of weights and other vital signs Discussed importance of weighing at home and not just at providers office Encouraged to use walker with all ambulation, fall precautions and preventions reviewed and discussed Patient Goals/Self-Care Activities Call office if I gain more than 2 pounds in one day or 5 pounds in one week Use salt in moderation, low salt heart healthy diabetic diet Find scale in the home and 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     Care Plan : Diabetes  Updates made by Leona Singleton, RN since 03/26/2021 12:00 AM     Problem: Hypoglycemia causing syncopal episodes   Priority: Medium     Long-Range Goal: Patient will report maintaing Hgb A1C of 7 or below in the next 90 days   Start Date: 01/27/2021  Expected End Date: 09/10/2021  This Visit's Progress: On track  Recent Progress: On track  Priority: Medium  Note:   Objective:  Lab Results  Component Value Date   HGBA1C 7.0 01/23/2021  Current Barriers:  Knowledge Deficits related to basic Diabetes pathophysiology and self care/management as evidenced by multiple syncopal episodes related to hypoglycemia.  Patient reports fasting blood sugar of 200 this morning with recent fasting ranges 100-200's.  Reports one episode of hypoglycemia and having to turn pump off once in the past few weeks.  Does report 2 falls in the last 2 weeks related to losing  balance, last one this past week, losing balance trying to bend over and pick something up off porch.  Denies injuries.  Again attempted to get patient to agree to home health services, continues to decline at this time. Limited Social Support Trouble getting insulin pump supplies-reports he now has insulin pump sensors Case Manager Clinical Goal(s):  patient will demonstrate improved adherence to prescribed treatment plan for diabetes self care/management as evidenced by: at least 4 times a day monitoring and recording of CBG, adherence to ADA/ carb modified diet, adherence to prescribed medication regimen, contacting provider for new or worsened symptoms or questions Interventions:  Collaboration with [name], MD regarding development and update of comprehensive plan of care as evidenced by provider attestation and co-signature Inter-disciplinary care team collaboration (see longitudinal plan of care) Provided education to patient about basic DM disease process Discussed medications and encouraged medication compliance Discussed plans with patient for ongoing care  management follow up and provided patient with direct contact information for care management team Encouraged continued participation with CCM Pharmacist  Reviewed glucose ranges and hypoglycemic events; and discussed dangers of hypoglycemia and treatment options, need for immediate sugar replacement with juice, sugar, soda in addition to snack or candy bar or glucose tablets Discussed proper treatment of hypoglycemia Reviewed scheduled/upcoming provider appointments including:  Endocrinology 7/19, PCP 7/19; confirmed patient is aware of both appointments on the same day (patient stated he is aware and plans to arrange transportation for both) Discussed healthy meal and snack options Rediscussed possible [place] or Therapy referral for safety evaluation after 2 recent falls, educating and discussing even if they start  services, patient can cancel at any time (patient still declines at this time) Discussed and encouraged patient to be proactive with health instead of reactive after falls Fall precautions and preventions reviewed and discussed, encouraged to use walker with all ambulation,  Instructed and encouraged to wear life alert at all times Discussed logging blood sugars and writing down insulin doses taken to help provider manage medications; sent 2022 Calendar Booklet to help with keeping log of blood sugars and insulin doses Reviewed and discussed proper diabetic foot care, encouraged patient to keep check on right foot splinter wound for signs and symptoms of infection Discussed and encouraged and reinforced patient to wear bedroom/house/slides while in the home and not to walk around bare foot Discussed with patient asking podiatrist for prescription for diabetic shoes Encouraged to contact Endocrinology if he has to turn insulin pump off again for hypoglycemia Encouraged patient to have staff changing unna boots check foot for infection after splinter removal Patient Goals/Self-Care Activities Check blood sugar at least 5 times a day Check blood sugar if I feel it is too high or too low Take the blood sugar meter and log to all doctor visits  Consider drinking Boost or other supplement in the evenings if not eating a complete meal Make sure to have candy or glucose tablets on you at all times; try drinking coke along with eating snack to treat hypoglycemia Eat at least 3 meals a day with snack in between to help prevent hypoglycemia Please consider Home Health Nursing/Therapy involvement Fall precautions and preventions; Wear life alert at all times Use walker with all ambulation Follow Up Plan: The care management team will reach out to the patient again over the next 20 business days.        Plan:The care management team will reach out to the patient again over the next 20 business  days.  [name] RN, MSN RN Care Management Coordinator [place] [phone] [email or web address]

## 2021-03-26 NOTE — Patient Instructions (Addendum)
Visit Information  Here is a list of your medications.  Please let me know if you have any questions.  Thanks, Sheppard Evens   [phone]   carvedilol (COREG) 3.125 MG tablet  (for blood pressure/heart) Take 1 tablet by mouth 2 (two) times daily with a meal.   Cholecalciferol (VITAMIN D3) 50 MCG (2000 UT) CAPS  (vitamin) Take by mouth.   ferrous sulfate 325 (65 FE) MG tablet   (iron pill) Take 325 mg by mouth daily with breakfast.   furosemide (LASIX) 40 MG tablet   (fluid pill/ for heart failure) Take 40 mg by mouth 2 (two) times daily. 2 tablets in the morning 1 tablet in the afternoon as needed   glucose 4 GM chewable tablet Chew 1 tablet (4 g total) by mouth once as needed for low blood sugar.   insulin aspart (NOVOLOG) 100 UNIT/ML injection (insulin pump) Instructions per Endocrinology   levothyroxine (SYNTHROID) 25 MCG tablet  (for your thyroid) Take 25 mcg by mouth daily before breakfast.   nystatin cream (MYCOSTATIN) APPLY TO AFFECTED AREA TWICE A DAY   potassium chloride (KLOR-CON) 10 MEQ tablet  (supplement with fluid medication) TAKE 1 TABLET BY MOUTH DAILY   pravastatin (PRAVACHOL) 40 MG tablet  (for your cholesterol) Take 40 mg by mouth daily.   vitamin B-12 (CYANOCOBALAMIN) 1000 MCG tablet    (vitamin) Take 1,000 mcg by mouth daily    PATIENT GOALS:  Goals Addressed             This Visit's Progress    (RNCM) Monitor and Manage My Blood Sugar-Diabetes Type 1   On track    Timeframe:  Long-Range Goal Priority:  High Start Date:   12/06/20                          Expected End Date:  10/10/21                     Follow Up Date 04/09/2021    Check blood sugar at least 5 times a day Check blood sugar if I feel it is too high or too low Take the blood sugar meter and log to all doctor visits  Consider drinking Boost or other supplement in the evenings if not eating a complete meal Make sure to have candy or glucose tablets on you at all times; try drinking coke along with  eating snack to treat hypoglycemia Eat at least 3 meals a day with snack in between to help prevent hypoglycemia Please consider Home Health Nursing/Therapy involvement Fall precautions and preventions; Wear life alert at all times Use walker with all ambulation   Why is this important?   Checking your blood sugar at home helps to keep it from getting very high or very low.  Writing the results in a diary or log helps the doctor know how to care for you.  Your blood sugar log should have the time, the date and the results.  Also, write down the amount of insulin or other medicine you take.  Other information like what you ate, exercise done and how you were feeling will also be helpful..     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   It is important to check your weight daily and watch how much salt and liquids you have.  It will help you to manage your heart failure.    Notes:          Patient verbalizes understanding of instructions provided today and agrees to view in [place].   The care management team will reach out to the patient again over the next 20 business days.   [name] RN, MSN RN  Care Management Coordinator [place] [phone] [email or web address]

## 2021-03-27 ENCOUNTER — Other Ambulatory Visit: Payer: Self-pay

## 2021-03-27 ENCOUNTER — Ambulatory Visit (INDEPENDENT_AMBULATORY_CARE_PROVIDER_SITE_OTHER): Payer: Medicare Other | Admitting: Nurse Practitioner

## 2021-03-27 VITALS — BP 102/65 | HR 120 | Ht 70.0 in | Wt 233.0 lb

## 2021-03-27 DIAGNOSIS — L97201 Non-pressure chronic ulcer of unspecified calf limited to breakdown of skin: Secondary | ICD-10-CM

## 2021-03-27 NOTE — Progress Notes (Signed)
History of Present Illness  There is no documented history at this time  Assessments & Plan   There are no diagnoses linked to this encounter.    Additional instructions  Subjective:  Patient presents with venous ulcer of the Bilateral lower extremity.    Procedure:  3 layer unna wrap was placed Bilateral lower extremity.   Plan:   Follow up in one week.  

## 2021-03-31 ENCOUNTER — Encounter (INDEPENDENT_AMBULATORY_CARE_PROVIDER_SITE_OTHER): Payer: Self-pay | Admitting: Nurse Practitioner

## 2021-03-31 DIAGNOSIS — N1831 Chronic kidney disease, stage 3a: Secondary | ICD-10-CM | POA: Diagnosis not present

## 2021-03-31 DIAGNOSIS — R600 Localized edema: Secondary | ICD-10-CM | POA: Diagnosis not present

## 2021-03-31 DIAGNOSIS — Z905 Acquired absence of kidney: Secondary | ICD-10-CM | POA: Diagnosis not present

## 2021-03-31 DIAGNOSIS — E1029 Type 1 diabetes mellitus with other diabetic kidney complication: Secondary | ICD-10-CM | POA: Diagnosis not present

## 2021-03-31 DIAGNOSIS — I10 Essential (primary) hypertension: Secondary | ICD-10-CM | POA: Diagnosis not present

## 2021-04-01 DIAGNOSIS — I495 Sick sinus syndrome: Secondary | ICD-10-CM | POA: Diagnosis not present

## 2021-04-03 ENCOUNTER — Other Ambulatory Visit: Payer: Self-pay

## 2021-04-03 ENCOUNTER — Ambulatory Visit (INDEPENDENT_AMBULATORY_CARE_PROVIDER_SITE_OTHER): Payer: Medicare Other | Admitting: Nurse Practitioner

## 2021-04-03 VITALS — BP 123/70 | HR 65 | Ht 70.0 in | Wt 233.0 lb

## 2021-04-03 DIAGNOSIS — I10 Essential (primary) hypertension: Secondary | ICD-10-CM

## 2021-04-03 DIAGNOSIS — E1151 Type 2 diabetes mellitus with diabetic peripheral angiopathy without gangrene: Secondary | ICD-10-CM | POA: Diagnosis not present

## 2021-04-03 DIAGNOSIS — I890 Lymphedema, not elsewhere classified: Secondary | ICD-10-CM

## 2021-04-07 ENCOUNTER — Ambulatory Visit: Payer: Medicare Other | Admitting: Podiatry

## 2021-04-09 ENCOUNTER — Ambulatory Visit: Payer: Medicare Other | Admitting: *Deleted

## 2021-04-09 DIAGNOSIS — E1151 Type 2 diabetes mellitus with diabetic peripheral angiopathy without gangrene: Secondary | ICD-10-CM

## 2021-04-09 DIAGNOSIS — I5022 Chronic systolic (congestive) heart failure: Secondary | ICD-10-CM

## 2021-04-09 NOTE — Chronic Care Management (AMB) (Signed)
Chronic Care Management   CCM RN Visit Note  04/09/2021 Name: Gary [name] MRN: [id number] DOB: Jun 06, 1946  Subjective: Gary [name] is a 75 y.o. year old male who is a primary care patient of [name], MD. The care management team was consulted for assistance with disease management and care coordination needs.    Engaged with patient by telephone for follow up visit in response to provider referral for case management and/or care coordination services.   Consent to Services:  The patient was given information about Chronic Care Management services, agreed to services, and gave verbal consent prior to initiation of services.  Please see initial visit note for detailed documentation.   Patient agreed to services and verbal consent obtained.   Assessment: Review of patient past medical history, allergies, medications, health status, including review of consultants reports, laboratory and other test data, was performed as part of comprehensive evaluation and provision of chronic care management services.   SDOH (Social Determinants of Health) assessments and interventions performed:    CCM Care Plan  Allergies  Allergen Reactions   Rofecoxib Nausea Only    Outpatient Encounter Medications as of 04/09/2021  Medication Sig   furosemide (LASIX) 40 MG tablet Take 40 mg by mouth 2 (two) times daily. 2 tablets in the morning 1 tablet in the afternoon as needed   spironolactone (ALDACTONE) 25 MG tablet    carvedilol (COREG) 3.125 MG tablet Take 1 tablet by mouth 2 (two) times daily with a meal.   Cholecalciferol (VITAMIN D3) 50 MCG (2000 UT) CAPS Take by mouth.   ferrous sulfate 325 (65 FE) MG tablet Take 325 mg by mouth daily with breakfast.   gabapentin (NEURONTIN) 100 MG capsule    glucose 4 GM chewable tablet Chew 1 tablet (4 g total) by mouth once as needed for low blood sugar.   insulin aspart (NOVOLOG) 100 UNIT/ML injection Basal rate 12am 0.8 units per hour, 9am 0.9 units per  hour. (total basal insulin 20.7 units). Carbohydrate ratio 1 units for every 8gm of carbohydrate. Correction factor 1 units for every 50mg /dl over target cbg. Target CBG 80-120   levothyroxine (SYNTHROID) 25 MCG tablet Take 25 mcg by mouth daily before breakfast.   nystatin cream (MYCOSTATIN) APPLY TO AFFECTED AREA TWICE A DAY   potassium chloride (KLOR-CON) 10 MEQ tablet TAKE 1 TABLET BY MOUTH TWICE DAILY FOR 3 DAYS THEN CONTINUE ONCE DAILY   pravastatin (PRAVACHOL) 40 MG tablet Take 40 mg by mouth daily.    vitamin B-12 (CYANOCOBALAMIN) 1000 MCG tablet Take 1,000 mcg by mouth daily.   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, or unspecified 09/13/2019   Proteinuria 09/13/2019   Lower extremity pain, bilateral    Diabetic foot ulcer associated with type 1 diabetes mellitus (HCC)    Acute on chronic systolic  CHF (congestive heart failure) ([place])    Cellulitis 08/18/2019   Lower limb ulcer, calf ([place]) 08/01/2019   Left hip pain 07/16/2019   Pain due to onychomycosis of toenails of both feet 04/03/2019   Swelling of limb 02/07/2019   Pancytopenia ([place]) 12/03/2018   CKD (chronic kidney disease) stage 3, GFR 30-59 ml/min (HCC) 09/16/2018   Left shoulder pain 09/16/2018   Chronic cholecystitis 12/29/2017   Graves disease 12/29/2017   Peripheral neuropathy  12/29/2017   S/p nephrectomy 12/29/2017   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   Symptomatic bradycardia 12/02/2017   AKI (acute kidney injury) ([place]) 83/15/1761   Chronic systolic CHF (congestive heart failure) ([place]) 12/02/2017   Saturday night paralysis 11/12/2017   Hypoglycemia 10/28/2017   Goals of care, counseling/discussion 10/24/2017   Lymphedema 10/20/2017   Iron deficiency anemia 09/11/2017   Fall 08/13/2017   Humerus fracture 08/13/2017   Anemia 01/12/2017   Bilateral carotid artery stenosis 06/29/2016   Hand laceration 12/16/2015   Adjustment disorder with depressed mood 08/11/2015   Cardiomyopathy, idiopathic ([place]) 04/22/2015   CAD in native artery 11/02/2014   Mantle cell lymphoma ([place]) 08/03/2014   History of colonic polyps 04/29/2014   Irregular heart beat 04/22/2014   SOB (shortness of breath) 04/22/2014   Stress 03/21/2014   PVC (premature ventricular contraction) 02/21/2014   GERD (gastroesophageal reflux disease) 10/23/2013   Gastroparesis 06/25/2013   Chest pain 04/13/2013   Thyroid disease 04/13/2013   Diabetes ([place]) 04/13/2013   Essential hypertension, benign 04/13/2013   Hypercholesterolemia 04/13/2013    Conditions to be addressed/monitored:CHF and DMII  Care Plan : Heart Failure (Adult)  Updates made by Leona Singleton, RN since 04/09/2021 12:00 AM     Problem: Disease Progression (Heart Failure)   Priority: Medium     Long-Range Goal: Patient will report no heart failure exacerbations in the next 90 days   Start Date: 12/06/2020  Expected End Date: 09/10/2021  This Visit's Progress: Not on track  Recent Progress: Not on track  Priority: Medium  Note:   Current Barriers:  Knowledge deficit related to basic heart failure pathophysiology and self care management as evidenced by continued lower extremity edema requiring Unna Boots (changed every Thursday at Vascular office).  Patient continues to report he does not weigh himself at home.  States  he weighs weekly at providers office when he gets his unna boots changed.  States he still cannot locate his home scale at this time.  Discussed importance of daily weight monitoring and encouraged patient to use home walker to help balance on his home scale.  Reports lower extremity edema is about the same (better than a year ago) and denies any increase in shortness of breath at this time. Patient does not have transportation to provider appointments Transportation Barriers; going to use [facility] transportation for appointments within [facility] system and using [employer] for other medical appointments Lacks social connections Unable to perform IADLs independently Case Manager Clinical Goal(s):  patient will verbalize understanding of Heart Failure Action Plan and when to call doctor patient will take all Heart Failure mediations as prescribed patient will weigh daily and record (notifying MD of 3 lb weight gain over night or 5 lb in a week) Interventions:  Collaboration with [name], MD regarding development and update of comprehensive plan of care as evidenced by provider attestation and co-signature Inter-disciplinary care team collaboration (see longitudinal plan of care) Provided verbal education on low sodium diet Advised patient to weigh each morning  after emptying bladder Discussed importance of daily weight and advised patient to weigh and record daily and encouraged patient to do so; Reinforced need to weight daily and encouraged patient to use walker in front of scale to help balance while standing on scale Advised and encouraged patient to find home scale and make sure that it works, seeking assistance from sister to help him locate the scale Congratulated on finding walker so it can be used to stand on scale, also encouraged to use walker with ambulation Reviewed signs and symptoms of heart failure exacerbation Encouraged elevation of lower extremities as able Encouraged continued  use of [facility] and [employer] transportation Discussed medications and encouraged medication compliance Encouraged to use 2022 [place] previously sent to help with logging of weights and other vital signs Discussed importance of weighing at home and not just at providers office Encouraged to use walker with all ambulation, fall precautions and preventions reviewed and discussed Discussed possibility of buying new scale and encouraged patient to consider Patient Goals/Self-Care Activities Call office if I gain more than 2 pounds in one day or 5 pounds in one week Use salt in moderation, low salt heart healthy diabetic diet Find scale in the home and verify 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     Care Plan : Diabetes  Updates made by Leona Singleton, RN since 04/09/2021 12:00 AM     Problem: Hypoglycemia causing syncopal episodes   Priority: Medium     Long-Range Goal: Patient will report maintaing Hgb A1C of 7 or below in the next 90 days   Start Date: 01/27/2021  Expected End Date: 10/10/2021  This Visit's Progress: On track  Recent Progress: On track  Priority: Medium  Note:   Objective:  Lab Results  Component Value Date   HGBA1C 7.0 01/23/2021  Current Barriers:  Knowledge Deficits related to basic Diabetes pathophysiology and self care/management as evidenced by multiple syncopal episodes related to hypoglycemia.  Patient reports fasting blood sugar of 160 this morning with recent fasting ranges 80-260's.  Denies  any significant hypoglycemia and need to turn off insulin pump.  Does report right foot tenderness where splinter was.  Denies any drainage or signs and symptoms of infection.  Encouraged patient to have nurses at vascular office to check foot during each Unna Boot change.  Denies fall in the last 2 weeks. Limited Social Support Trouble getting insulin pump supplies-reports he now has insulin pump sensors Case Manager Clinical Goal(s):  patient will demonstrate improved adherence to prescribed treatment plan for diabetes self care/management as evidenced by: at least 4 times a day monitoring and recording of CBG, adherence to ADA/ carb modified diet, adherence to prescribed medication regimen, contacting provider for new or worsened symptoms or questions Interventions:  Collaboration with [name], MD regarding development and update of comprehensive plan of care as evidenced by provider attestation and co-signature Inter-disciplinary care team collaboration (see longitudinal plan of care) Provided education to patient about basic DM disease process  Discussed medications and encouraged medication compliance Discussed plans with patient for ongoing care management follow up and provided patient with direct contact information for care management team Encouraged continued participation with CCM Pharmacist  Reviewed glucose ranges and hypoglycemic events; and discussed dangers of hypoglycemia and treatment options, need for immediate sugar replacement with juice, sugar, soda in addition to snack or candy bar or glucose tablets Discussed proper treatment of hypoglycemia Reviewed scheduled/upcoming provider appointments including:  Endocrinology 7/19, PCP 7/19; confirmed patient is aware of both appointments on the same day (patient stated he is aware and plans to arrange transportation for both) Discussed healthy meal and snack options Rediscussed possible [place] or Therapy referral  for safety evaluation after 2 recent falls, educating and discussing even if they start services, patient can cancel at any time (patient still declines at this time) Discussed and encouraged patient to be proactive with health instead of reactive after falls Fall precautions and preventions reviewed and discussed, encouraged to use walker with all ambulation,  Instructed and encouraged to wear life alert at all times Discussed logging blood sugars and writing down insulin doses taken to help provider manage medications; sent 2022 Calendar Booklet to help with keeping log of blood sugars and insulin doses Reviewed and discussed proper diabetic foot care, encouraged patient to keep check on right foot splinter wound for signs and symptoms of infection Discussed and encouraged and reinforced patient to wear bedroom/house/slides while in the home and not to walk around bare foot Discussed with patient asking podiatrist for prescription for diabetic shoes Encouraged to contact Endocrinology if he has to turn insulin pump off again for hypoglycemia Encouraged patient to have staff changing unna boots check foot for infection after splinter removal Patient Goals/Self-Care Activities Check blood sugar at least 5 times a day Check blood sugar if I feel it is too high or too low Take the blood sugar meter and log to all doctor visits  Consider drinking Boost or other supplement in the evenings if not eating a complete meal Make sure to have candy or glucose tablets on you at all times; try drinking coke along with eating snack to treat hypoglycemia Eat at least 3 meals a day with snack in between to help prevent hypoglycemia Please consider Home Health Nursing/Therapy involvement Fall precautions and preventions; Wear life alert at all times Use walker with all ambulation Follow Up Plan: The care management team will reach out to the patient again over the next 30 business days.        Plan:The care  management team will reach out to the patient again over the next 30 business days.  [name] RN, MSN RN Care Management Coordinator [place] [phone] [email or web address]

## 2021-04-09 NOTE — Patient Instructions (Signed)
Visit Information  PATIENT GOALS:  Goals Addressed             This Visit's Progress    (RNCM) Monitor and Manage My Blood Sugar-Diabetes Type 1   On track    Timeframe:  Long-Range Goal Priority:  High Start Date:   12/06/20                          Expected End Date:  10/10/21                     Follow Up Date 05/02/2021    Check blood sugar at least 5 times a day Check blood sugar if I feel it is too high or too low Take the blood sugar meter and log to all doctor visits  Consider drinking Boost or other supplement in the evenings if not eating a complete meal Make sure to have candy or glucose tablets on you at all times; try drinking coke along with eating snack to treat hypoglycemia Eat at least 3 meals a day with snack in between to help prevent hypoglycemia Please consider Home Health Nursing/Therapy involvement Fall precautions and preventions; Wear life alert at all times Use walker with all ambulation   Why is this important?   Checking your blood sugar at home helps to keep it from getting very high or very low.  Writing the results in a diary or log helps the doctor know how to care for you.  Your blood sugar log should have the time, the date and the results.  Also, write down the amount of insulin or other medicine you take.  Other information like what you ate, exercise done and how you were feeling will also be helpful..     Notes:       (RNCM) Track and Manage Fluids and Swelling-Heart Failure   Not on track    Timeframe:  Long-Range Goal Priority:  Medium Start Date:   12/06/20                          Expected End Date:  10/10/21                     Follow Up Date 05/02/21   Call office if I gain more than 2 pounds in one day or 5 pounds in one week Use salt in moderation, low salt heart healthy diabetic diet Find scale in the home and verify it still works [employer] secretary myself daily and write in log for provider review (at least start to weigh a few times a  week working way up to daily) Take medications as prescribed Place walker in front of scale to help balance self while standing to weigh (please consider trying this to weigh yourself) Ask sister to help you find your scale and verify if it is working properly   Why is this important?   It is important to check your weight daily and watch how much salt and liquids you have.  It will help you to manage your heart failure.    Notes:          Patient verbalizes understanding of instructions provided today and agrees to view in [place].   The care management team will reach out to the patient again over the next 30 business days.   [name] RN, MSN RN Care Management Coordinator [employer] [phone]  [email or web address]

## 2021-04-10 ENCOUNTER — Ambulatory Visit: Payer: Medicare Other | Admitting: Podiatry

## 2021-04-10 ENCOUNTER — Ambulatory Visit (INDEPENDENT_AMBULATORY_CARE_PROVIDER_SITE_OTHER): Payer: Medicare Other | Admitting: Nurse Practitioner

## 2021-04-10 ENCOUNTER — Other Ambulatory Visit: Payer: Self-pay

## 2021-04-10 ENCOUNTER — Encounter (INDEPENDENT_AMBULATORY_CARE_PROVIDER_SITE_OTHER): Payer: Self-pay

## 2021-04-10 VITALS — BP 118/71 | HR 88 | Resp 16 | Wt 231.4 lb

## 2021-04-10 DIAGNOSIS — L97201 Non-pressure chronic ulcer of unspecified calf limited to breakdown of skin: Secondary | ICD-10-CM | POA: Diagnosis not present

## 2021-04-10 NOTE — Progress Notes (Signed)
History of Present Illness  There is no documented history at this time  Assessments & Plan   There are no diagnoses linked to this encounter.    Additional instructions  Subjective:  Patient presents with venous ulcer of the Bilateral lower extremity.    Procedure:  3 layer unna wrap was placed Bilateral lower extremity.   Plan:   Follow up in one week.  

## 2021-04-13 ENCOUNTER — Other Ambulatory Visit: Payer: Self-pay | Admitting: Internal Medicine

## 2021-04-14 ENCOUNTER — Encounter (INDEPENDENT_AMBULATORY_CARE_PROVIDER_SITE_OTHER): Payer: Self-pay | Admitting: Nurse Practitioner

## 2021-04-15 ENCOUNTER — Encounter (INDEPENDENT_AMBULATORY_CARE_PROVIDER_SITE_OTHER): Payer: Self-pay | Admitting: Nurse Practitioner

## 2021-04-15 NOTE — Progress Notes (Signed)
Subjective:    [name], male    DOB: March 23, 1946, 75 y.o.   MRN: [id number] Chief Complaint  Patient presents with   Follow-up    4wk unna boot check    Gary [name] is a 75 year old male that presents today for evaluation of lower extremity edema following Unna wraps.  The patient is doing well thus far.  The swelling is well controlled and there are no new ulcerations.  He continues to have several small ulceration on his toes but it has not worsened and actually looks like it has improved somewhat.  He denies any more recent syncopal events. He denies any fever or chills   Review of Systems  Cardiovascular:  Positive for leg swelling.  Musculoskeletal:  Positive for gait problem.  Neurological:  Positive for weakness.  All other systems reviewed and are negative.     Objective:   Physical Exam Vitals reviewed.  HENT:     Head: Normocephalic.  Cardiovascular:     Rate and Rhythm: Normal rate.     Pulses: Normal pulses.  Pulmonary:     Effort: Pulmonary effort is normal.  Musculoskeletal:     Right lower leg: 1+ Edema present.     Left lower leg: 1+ Edema present.  Skin:    General: Skin is dry.  Neurological:     Mental Status: He is alert and oriented to person, place, and time.  Psychiatric:        Mood and Affect: Mood normal.        Behavior: Behavior normal.        Thought Content: Thought content normal.        Judgment: Judgment normal.    BP 123/70   Pulse 65   Ht 5\' 10"  (1.778 m)   Wt 233 lb (105.7 kg)   BMI 33.43 kg/m   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    Gastroparesis    Graves disease    Hypercholesterolemia    Hypertension    Mantle cell lymphoma ([place]) 07/2014   Peripheral neuropathy    Shingles     Social History   Socioeconomic History   Marital status: Widowed     Spouse name: Not on file   Number of children: 1   Years of education: 81   Highest education level: 12th grade  Occupational History   Occupation: Retired  Tobacco Use   Smoking status: Former    Pack years: 0.00   Smokeless tobacco: Current    Types: Chew  Scientific laboratory technician Use: Never used  Substance and Sexual Activity   Alcohol use: Yes    Alcohol/week: 3.0 standard drinks    Types: 3 Cans of beer per week    Comment: nightly   Drug use: Never   Sexual activity: Not Currently  Other Topics Concern   Not on file  Social History Narrative   Not on file   Social Determinants of Health   Financial Resource Strain: Low Risk    Difficulty of Paying Living Expenses: Not very hard  Food Insecurity: No Food Insecurity   Worried About Running Out of Food in the Last Year: Never true   Ran Out of Food in the Last Year: Never true  Transportation Needs: No Transportation Needs   Lack of Transportation (Medical):  No   Lack of Transportation (Non-Medical): No  Physical Activity: Inactive   Days of Exercise per Week: 0 days   Minutes of Exercise per Session: 0 min  Stress: Stress Concern Present   Feeling of Stress : To some extent  Social Connections: Socially Isolated   Frequency of Communication with Friends and Family: More than three times a week   Frequency of Social Gatherings with Friends and Family: Twice a week   Attends Religious Services: Never   Marine scientist or Organizations: No   Attends Archivist Meetings: Never   Marital Status: Widowed  Human resources officer Violence: Not At Risk   Fear of Current or Ex-Partner: No   Emotionally Abused: No   Physically Abused: No   Sexually Abused: No    Past Surgical History:  Procedure Laterality Date   CHOLECYSTECTOMY     NEPHRECTOMY     right   PACEMAKER INSERTION N/A 12/07/2017   Procedure: INSERTION PACEMAKER DUAL CHAMBER INITIAL INSERT;  Surgeon: Isaias Cowman, MD;  Location: [facility] OR[facility];   Service: Cardiovascular;  Laterality: N/A;   PORTA CATH INSERTION N/A 11/03/2017   Procedure: PORTA CATH INSERTION;  Surgeon: Katha Cabal, MD;  Location: [facility] [facility];  Service: Cardiovascular;  Laterality: N/A;    Family History  Problem Relation Age of Onset   Breast cancer Mother    Diabetes Father    Cancer Father        Lymphoma   Alcohol abuse Maternal Uncle    Diabetes Sister    Heart disease Other        grandfather    Allergies  Allergen Reactions   Rofecoxib Nausea Only    CBC Latest Ref Rng & Units 07/29/2020 06/24/2020 01/24/2020  WBC 4.0 - 10.5 K/uL 6.1 6.3 5.7  Hemoglobin 13.0 - 17.0 g/dL 13.9 13.1 13.5  Hematocrit 39.0 - 52.0 % 40.0 39.0 40.8  Platelets 150 - 400 K/uL 102(L) 131.0(L) 118(L)      CMP     Component Value Date/Time   NA 144 02/18/2021 1249   NA 138 07/15/2014 0515   K 3.4 (L) 02/18/2021 1249   K 3.5 01/16/2015 1429   CL 105 02/18/2021 1249   CL 110 (H) 07/15/2014 0515   CO2 29 02/18/2021 1249   CO2 24 07/15/2014 0515   GLUCOSE 69 (L) 02/18/2021 1249   GLUCOSE 145 (H) 07/15/2014 0515   BUN 17 02/18/2021 1249   BUN 17 07/15/2014 0515   CREATININE 1.27 02/18/2021 1249   CREATININE 0.89 01/16/2015 1429   CALCIUM 8.0 (L) 02/18/2021 1249   CALCIUM 7.1 (L) 07/15/2014 0515   PROT 7.1 07/29/2020 0952   PROT 6.2 (L) 10/22/2014 1000   ALBUMIN 3.4 (L) 07/29/2020 0952   ALBUMIN 2.6 (L) 10/22/2014 1000   AST 24 07/29/2020 0952   AST 19 10/22/2014 1000   ALT 16 07/29/2020 0952   ALT 21 10/22/2014 1000   ALKPHOS 115 07/29/2020 0952   ALKPHOS 118 (H) 10/22/2014 1000   BILITOT 1.1 07/29/2020 0952   BILITOT 0.8 10/22/2014 1000   GFRNONAA >60 01/24/2021 0830   GFRNONAA >60 01/16/2015 1429   GFRAA 41 (L) 01/24/2020 0925   GFRAA >60 01/16/2015 1429     No results found.     Assessment & Plan:   1. Lymphedema The patient's lower extremity edema is improving.  The patient has some small blisters on his toes but his recent  shoe changes should hopefully continue to  help this.  We will continue the patient wraps to be changed on a weekly basis.  We will follow-up in 6 weeks.  2. Essential hypertension, benign Continue antihypertensive medications as already ordered, these medications have been reviewed and there are no changes at this time.   3. Type 2 diabetes mellitus with diabetic peripheral angiopathy without gangrene, without long-term current use of insulin (HCC) Continue hypoglycemic medications as already ordered, these medications have been reviewed and there are no changes at this time.  Hgb A1C to be monitored as already arranged by primary service    Current Outpatient Medications on File Prior to Visit  Medication Sig Dispense Refill   carvedilol (COREG) 3.125 MG tablet Take 1 tablet by mouth 2 (two) times daily with a meal.     Cholecalciferol (VITAMIN D3) 50 MCG (2000 UT) CAPS Take by mouth.     ferrous sulfate 325 (65 FE) MG tablet Take 325 mg by mouth daily with breakfast.     furosemide (LASIX) 40 MG tablet Take 40 mg by mouth 2 (two) times daily. 2 tablets in the morning 1 tablet in the afternoon as needed     gabapentin (NEURONTIN) 100 MG capsule      glucose 4 GM chewable tablet Chew 1 tablet (4 g total) by mouth once as needed for low blood sugar. 50 tablet 0   insulin aspart (NOVOLOG) 100 UNIT/ML injection Basal rate 12am 0.8 units per hour, 9am 0.9 units per hour. (total basal insulin 20.7 units). Carbohydrate ratio 1 units for every 8gm of carbohydrate. Correction factor 1 units for every 50mg /dl over target cbg. Target CBG 80-120 1 vial 12   levothyroxine (SYNTHROID) 25 MCG tablet Take 25 mcg by mouth daily before breakfast.     nystatin cream (MYCOSTATIN) APPLY TO AFFECTED AREA TWICE A DAY 30 g 1   pravastatin (PRAVACHOL) 40 MG tablet Take 40 mg by mouth daily.      spironolactone (ALDACTONE) 25 MG tablet      vitamin B-12 (CYANOCOBALAMIN) 1000 MCG tablet Take 1,000 mcg by mouth daily.      Current Facility-Administered Medications on File Prior to Visit  Medication Dose Route Frequency Provider Last Rate Last Admin   heparin lock flush 100 unit/mL  500 Units Intravenous Once Lloyd Huger, MD       sodium chloride flush (NS) 0.9 % injection 10 mL  10 mL Intravenous PRN Lloyd Huger, MD   10 mL at 02/01/18 0829   sodium chloride flush (NS) 0.9 % injection 10 mL  10 mL Intravenous PRN Lloyd Huger, MD   10 mL at 04/05/18 0800    There are no Patient Instructions on file for this visit. No follow-ups on file.   Kris Hartmann, NP

## 2021-04-17 ENCOUNTER — Other Ambulatory Visit: Payer: Self-pay

## 2021-04-17 ENCOUNTER — Ambulatory Visit (INDEPENDENT_AMBULATORY_CARE_PROVIDER_SITE_OTHER): Payer: Medicare Other | Admitting: Nurse Practitioner

## 2021-04-17 VITALS — BP 137/69 | HR 74 | Resp 17 | Ht 70.0 in | Wt 231.0 lb

## 2021-04-17 DIAGNOSIS — L97201 Non-pressure chronic ulcer of unspecified calf limited to breakdown of skin: Secondary | ICD-10-CM | POA: Diagnosis not present

## 2021-04-17 NOTE — Progress Notes (Signed)
History of Present Illness  There is no documented history at this time  Assessments & Plan   There are no diagnoses linked to this encounter.    Additional instructions  Subjective:  Patient presents with venous ulcer of the Bilateral lower extremity.    Procedure:  3 layer unna wrap was placed Bilateral lower extremity.   Plan:   Follow up in one week.  

## 2021-04-20 ENCOUNTER — Encounter (INDEPENDENT_AMBULATORY_CARE_PROVIDER_SITE_OTHER): Payer: Self-pay | Admitting: Nurse Practitioner

## 2021-04-24 ENCOUNTER — Ambulatory Visit (INDEPENDENT_AMBULATORY_CARE_PROVIDER_SITE_OTHER): Payer: Medicare Other | Admitting: Nurse Practitioner

## 2021-04-24 ENCOUNTER — Other Ambulatory Visit: Payer: Self-pay

## 2021-04-24 VITALS — BP 130/71 | HR 65 | Ht 70.0 in | Wt 228.0 lb

## 2021-04-24 DIAGNOSIS — L97201 Non-pressure chronic ulcer of unspecified calf limited to breakdown of skin: Secondary | ICD-10-CM

## 2021-04-24 NOTE — Progress Notes (Signed)
History of Present Illness  There is no documented history at this time  Assessments & Plan   There are no diagnoses linked to this encounter.    Additional instructions  Subjective:  Patient presents with venous ulcer of the Bilateral lower extremity.    Procedure:  3 layer unna wrap was placed Bilateral lower extremity.   Plan:   Follow up in one week.  

## 2021-04-28 ENCOUNTER — Encounter (INDEPENDENT_AMBULATORY_CARE_PROVIDER_SITE_OTHER): Payer: Self-pay | Admitting: Nurse Practitioner

## 2021-04-29 ENCOUNTER — Other Ambulatory Visit: Payer: Self-pay

## 2021-04-29 ENCOUNTER — Ambulatory Visit (INDEPENDENT_AMBULATORY_CARE_PROVIDER_SITE_OTHER): Payer: Medicare Other | Admitting: Internal Medicine

## 2021-04-29 VITALS — BP 120/70 | HR 83 | Temp 97.9°F | Resp 16 | Ht 70.0 in | Wt 229.0 lb

## 2021-04-29 DIAGNOSIS — E079 Disorder of thyroid, unspecified: Secondary | ICD-10-CM

## 2021-04-29 DIAGNOSIS — C8318 Mantle cell lymphoma, lymph nodes of multiple sites: Secondary | ICD-10-CM | POA: Diagnosis not present

## 2021-04-29 DIAGNOSIS — D61818 Other pancytopenia: Secondary | ICD-10-CM | POA: Diagnosis not present

## 2021-04-29 DIAGNOSIS — I428 Other cardiomyopathies: Secondary | ICD-10-CM | POA: Diagnosis not present

## 2021-04-29 DIAGNOSIS — D696 Thrombocytopenia, unspecified: Secondary | ICD-10-CM | POA: Diagnosis not present

## 2021-04-29 DIAGNOSIS — I429 Cardiomyopathy, unspecified: Secondary | ICD-10-CM

## 2021-04-29 DIAGNOSIS — E1151 Type 2 diabetes mellitus with diabetic peripheral angiopathy without gangrene: Secondary | ICD-10-CM

## 2021-04-29 DIAGNOSIS — E10649 Type 1 diabetes mellitus with hypoglycemia without coma: Secondary | ICD-10-CM | POA: Diagnosis not present

## 2021-04-29 DIAGNOSIS — L97509 Non-pressure chronic ulcer of other part of unspecified foot with unspecified severity: Secondary | ICD-10-CM

## 2021-04-29 DIAGNOSIS — I890 Lymphedema, not elsewhere classified: Secondary | ICD-10-CM | POA: Diagnosis not present

## 2021-04-29 DIAGNOSIS — I5022 Chronic systolic (congestive) heart failure: Secondary | ICD-10-CM

## 2021-04-29 DIAGNOSIS — I10 Essential (primary) hypertension: Secondary | ICD-10-CM

## 2021-04-29 DIAGNOSIS — E7800 Pure hypercholesterolemia, unspecified: Secondary | ICD-10-CM

## 2021-04-29 DIAGNOSIS — N1832 Chronic kidney disease, stage 3b: Secondary | ICD-10-CM | POA: Diagnosis not present

## 2021-04-29 DIAGNOSIS — E785 Hyperlipidemia, unspecified: Secondary | ICD-10-CM | POA: Diagnosis not present

## 2021-04-29 DIAGNOSIS — E1069 Type 1 diabetes mellitus with other specified complication: Secondary | ICD-10-CM | POA: Diagnosis not present

## 2021-04-29 DIAGNOSIS — E10621 Type 1 diabetes mellitus with foot ulcer: Secondary | ICD-10-CM | POA: Diagnosis not present

## 2021-04-29 DIAGNOSIS — I152 Hypertension secondary to endocrine disorders: Secondary | ICD-10-CM | POA: Diagnosis not present

## 2021-04-29 DIAGNOSIS — I495 Sick sinus syndrome: Secondary | ICD-10-CM

## 2021-04-29 DIAGNOSIS — E1159 Type 2 diabetes mellitus with other circulatory complications: Secondary | ICD-10-CM | POA: Diagnosis not present

## 2021-04-29 DIAGNOSIS — I700 Atherosclerosis of aorta: Secondary | ICD-10-CM

## 2021-04-29 DIAGNOSIS — J439 Emphysema, unspecified: Secondary | ICD-10-CM

## 2021-04-29 DIAGNOSIS — I2510 Atherosclerotic heart disease of native coronary artery without angina pectoris: Secondary | ICD-10-CM

## 2021-04-29 NOTE — Progress Notes (Signed)
Patient ID: LAKOTA MARKGRAF, male   DOB: April 21, 1946, 75 y.o.   MRN: [id number]   Subjective:    Patient ID: [name], male    DOB: [birth year], 75 y.o.   MRN: [id number]  HPI This visit occurred during the SARS-CoV-2 public health emergency.  Safety protocols were in place, including screening questions prior to the visit, additional usage of staff PPE, and extensive cleaning of exam room while observing appropriate contact time as indicated for disinfecting solutions.   Patient here for a scheduled follow up.  Here to follow up regarding his blood sugar, cholesterol, blood pressure and lower extremity swelling.  Saw Dr Honor Junes this am.  Has sensor.  Low sugars - decreased.  Reviewed meter - 93% use - 56% range 33% high (2%very low, 1% low).  Eating better.  No nausea or vomiting.  No abdominal pain.  Bowels moving.  Did find his scales.  States in kilograms.  Discussed conversion.  Seeing vascular for lower extremity swelling - legs wrapped.  Discussed driving.  Low sugars are not as much of an issue now with sensor, but I am concerned regarding his reaction time. Discussed driving assessment.  He is getting rides through [place] to his appts.  His sister is going to the store, etc.  Overall feels things are stable.  Breathing stable.    Past Medical History:  Diagnosis Date   CHF (congestive heart failure) ([place])    Depression    Diabetes mellitus due to underlying condition with diabetic retinopathy with macular edema    Gastroparesis    Graves disease    Hypercholesterolemia    Hypertension    Mantle cell lymphoma ([place]) 07/2014   Peripheral neuropathy    Shingles    Past Surgical History:  Procedure Laterality Date   CHOLECYSTECTOMY     NEPHRECTOMY     right   PACEMAKER INSERTION N/A 12/07/2017   Procedure: INSERTION PACEMAKER DUAL CHAMBER INITIAL INSERT;  Surgeon: Isaias Cowman, MD;  Location: [facility];  Service: Cardiovascular;  Laterality: N/A;   PORTA CATH INSERTION N/A  11/03/2017   Procedure: PORTA CATH INSERTION;  Surgeon: Katha Cabal, MD;  Location: [place] [facility];  Service: Cardiovascular;  Laterality: N/A;   Family History  Problem Relation Age of Onset   Breast cancer Mother    Diabetes Father    Cancer Father        Lymphoma   Alcohol abuse Maternal Uncle    Diabetes Sister    Heart disease Other        grandfather   Social History   Socioeconomic History   Marital status: Widowed    Spouse name: Not on file   Number of children: 1   Years of education: 22   Highest education level: 12th grade  Occupational History   Occupation: Retired  Tobacco Use   Smoking status: Former   Smokeless tobacco: Current    Types: Nurse, children's Use: Never used  Substance and Sexual Activity   Alcohol use: Yes    Alcohol/week: 3.0 standard drinks    Types: 3 Cans of beer per week    Comment: nightly   Drug use: Never   Sexual activity: Not Currently  Other Topics Concern   Not on file  Social History Narrative   Not on file   Social Determinants of Health   Financial Resource Strain: Low Risk    Difficulty of Paying Living Expenses: Not very  hard  Food Insecurity: No Food Insecurity   Worried About Charity fundraiser in the Last Year: Never true   Ran Out of Food in the Last Year: Never true  Transportation Needs: No Transportation Needs   Lack of Transportation (Medical): No   Lack of Transportation (Non-Medical): No  Physical Activity: Inactive   Days of Exercise per Week: 0 days   Minutes of Exercise per Session: 0 min  Stress: Stress Concern Present   Feeling of Stress : To some extent  Social Connections: Socially Isolated   Frequency of Communication with Friends and Family: More than three times a week   Frequency of Social Gatherings with Friends and Family: Twice a week   Attends Religious Services: Never   Marine scientist or Organizations: No   Attends Archivist Meetings: Never    Marital Status: Widowed    Review of Systems  Constitutional:  Negative for appetite change and unexpected weight change.  HENT:  Negative for congestion and sinus pressure.   Respiratory:  Negative for cough and chest tightness.        Breathing stable.   Cardiovascular:  Positive for leg swelling. Negative for chest pain and palpitations.  Gastrointestinal:  Negative for abdominal pain, diarrhea, nausea and vomiting.  Genitourinary:  Negative for difficulty urinating and dysuria.  Musculoskeletal:  Negative for joint swelling and myalgias.  Skin:  Negative for color change and rash.  Neurological:  Negative for dizziness, light-headedness and headaches.  Psychiatric/Behavioral:  Negative for agitation and dysphoric mood.       Objective:    Physical Exam Constitutional:      General: He is not in acute distress.    Appearance: Normal appearance. He is well-developed.  HENT:     Head: Normocephalic and atraumatic.     Right Ear: External ear normal.     Left Ear: External ear normal.  Eyes:     General: No scleral icterus.       Right eye: No discharge.        Left eye: No discharge.  Cardiovascular:     Rate and Rhythm: Normal rate and regular rhythm.  Pulmonary:     Effort: Pulmonary effort is normal. No respiratory distress.     Breath sounds: Normal breath sounds.  Abdominal:     General: Bowel sounds are normal.     Palpations: Abdomen is soft.     Tenderness: There is no abdominal tenderness.  Musculoskeletal:        General: Swelling present. No tenderness.     Cervical back: Neck supple. No tenderness.     Comments: Legs wrapped.  No pain to palpation - bottom of foot.    Lymphadenopathy:     Cervical: No cervical adenopathy.  Skin:    Findings: No rash.     Comments: Stasis changes.    Neurological:     Mental Status: He is alert.  Psychiatric:        Mood and Affect: Mood normal.        Behavior: Behavior normal.    BP 120/70   Pulse 83   Temp 97.9  F (36.6 C)   Resp 16   Ht 5\' 10"  (1.778 m)   Wt 229 lb (103.9 kg)   SpO2 98%   BMI 32.86 kg/m  Wt Readings from Last 3 Encounters:  05/01/21 229 lb 6.4 oz (104.1 kg)  04/29/21 229 lb (103.9 kg)  04/24/21 228 lb (103.4 kg)  Outpatient Encounter Medications as of 04/29/2021  Medication Sig   carvedilol (COREG) 3.125 MG tablet Take 1 tablet by mouth 2 (two) times daily with a meal.   Cholecalciferol (VITAMIN D3) 50 MCG (2000 UT) CAPS Take by mouth.   ferrous sulfate 325 (65 FE) MG tablet Take 325 mg by mouth daily with breakfast.   furosemide (LASIX) 40 MG tablet Take 40 mg by mouth 2 (two) times daily. 2 tablets in the morning 1 tablet in the afternoon as needed   gabapentin (NEURONTIN) 100 MG capsule    glucose 4 GM chewable tablet Chew 1 tablet (4 g total) by mouth once as needed for low blood sugar.   insulin aspart (NOVOLOG) 100 UNIT/ML injection Basal rate 12am 0.8 units per hour, 9am 0.9 units per hour. (total basal insulin 20.7 units). Carbohydrate ratio 1 units for every 8gm of carbohydrate. Correction factor 1 units for every 50mg /dl over target cbg. Target CBG 80-120   levothyroxine (SYNTHROID) 25 MCG tablet Take 25 mcg by mouth daily before breakfast.   nystatin cream (MYCOSTATIN) APPLY TO AFFECTED AREA TWICE A DAY   potassium chloride (KLOR-CON) 10 MEQ tablet TAKE 1 TABLET BY MOUTH TWICE DAILY FOR 3 DAYS THEN CONTINUE ONCE DAILY   pravastatin (PRAVACHOL) 40 MG tablet Take 40 mg by mouth daily.    spironolactone (ALDACTONE) 25 MG tablet    vitamin B-12 (CYANOCOBALAMIN) 1000 MCG tablet Take 1,000 mcg by mouth daily.   Facility-Administered Encounter Medications as of 04/29/2021  Medication   heparin lock flush 100 unit/mL   sodium chloride flush (NS) 0.9 % injection 10 mL   sodium chloride flush (NS) 0.9 % injection 10 mL     Lab Results  Component Value Date   WBC 7.2 04/29/2021   HGB 13.5 04/29/2021   HCT 39.1 04/29/2021   PLT 149.0 (L) 04/29/2021   GLUCOSE 155  (H) 04/29/2021   CHOL 126 04/29/2021   TRIG 50.0 04/29/2021   HDL 44.20 04/29/2021   LDLCALC 72 04/29/2021   ALT 4 04/29/2021   AST 8 04/29/2021   NA 140 04/29/2021   K 3.3 (L) 04/29/2021   CL 102 04/29/2021   CREATININE 1.54 (H) 04/29/2021   BUN 21 04/29/2021   CO2 27 04/29/2021   TSH 1.81 04/29/2021   INR 1.12 07/14/2018   HGBA1C 7.0 01/23/2021   MICROALBUR 1.9 09/22/2016    CT Abdomen Pelvis Wo Contrast  Result Date: 01/25/2021 CLINICAL DATA:  Restaging mantle cell lymphoma EXAM: CT CHEST, ABDOMEN AND PELVIS WITHOUT CONTRAST TECHNIQUE: Multidetector CT imaging of the chest, abdomen and pelvis was performed following the standard protocol without IV contrast. COMPARISON:  07/29/2020 FINDINGS: CT CHEST FINDINGS Cardiovascular: Right Port-A-Cath tip: SVC. Dual lead pacer noted with proximal and distal lead tips in the right atrium and ventricle, respectively. Left anterior descending and right coronary artery atherosclerotic calcification. Mediastinum/Nodes: No pathologic adenopathy in the chest identified. Lungs/Pleura: Mild lingular scarring. Mild scarring in the posterior basal segment right lower lobe. Musculoskeletal: Various old rib fractures are present. However, there is subacute fractures of the right fourth, fifth, sixth, and seventh ribs laterally which were not present on 07/19/2020. Chronic deformity of the right proximal humerus. Thoracic kyphosis. CT ABDOMEN PELVIS FINDINGS Hepatobiliary: Cholecystectomy.  Otherwise unremarkable. Pancreas: Unremarkable Spleen: Unremarkable.  No splenomegaly. Adrenals/Urinary Tract: Absent right kidney. Both adrenal glands appear unremarkable. Left kidney unremarkable. Stomach/Bowel: Unremarkable Vascular/Lymphatic: Aortoiliac atherosclerotic vascular disease. Left common iliac node 1.0 cm in short axis on image 88 series 2,  previously the same by my measurements. Right common iliac node 0.8 cm in short axis on image 95 series 2, previously the  same by my measurements. Reproductive: Mild prostatomegaly. Other: No supplemental non-categorized findings. Musculoskeletal: Moderate degenerative hip arthropathy bilaterally. IMPRESSION: 1. No overtly pathologic adenopathy in the chest, abdomen, or pelvis, although there is a borderline enlarged but stable left common iliac node 1.0 cm in short axis. No splenomegaly 2. Subacute interval fractures of the right fourth, fifth, sixth, and seventh ribs. 3. Other imaging findings of potential clinical significance: Aortic Atherosclerosis (ICD10-I70.0). Coronary atherosclerosis. Chronic deformity of the right proximal humerus. Mild prostatomegaly. Absent right kidney. Electronically Signed   By: Van Clines M.D.   On: 01/25/2021 17:57   CT Chest Wo Contrast  Result Date: 01/25/2021 CLINICAL DATA:  Restaging mantle cell lymphoma EXAM: CT CHEST, ABDOMEN AND PELVIS WITHOUT CONTRAST TECHNIQUE: Multidetector CT imaging of the chest, abdomen and pelvis was performed following the standard protocol without IV contrast. COMPARISON:  07/29/2020 FINDINGS: CT CHEST FINDINGS Cardiovascular: Right Port-A-Cath tip: SVC. Dual lead pacer noted with proximal and distal lead tips in the right atrium and ventricle, respectively. Left anterior descending and right coronary artery atherosclerotic calcification. Mediastinum/Nodes: No pathologic adenopathy in the chest identified. Lungs/Pleura: Mild lingular scarring. Mild scarring in the posterior basal segment right lower lobe. Musculoskeletal: Various old rib fractures are present. However, there is subacute fractures of the right fourth, fifth, sixth, and seventh ribs laterally which were not present on 07/19/2020. Chronic deformity of the right proximal humerus. Thoracic kyphosis. CT ABDOMEN PELVIS FINDINGS Hepatobiliary: Cholecystectomy.  Otherwise unremarkable. Pancreas: Unremarkable Spleen: Unremarkable.  No splenomegaly. Adrenals/Urinary Tract: Absent right kidney. Both  adrenal glands appear unremarkable. Left kidney unremarkable. Stomach/Bowel: Unremarkable Vascular/Lymphatic: Aortoiliac atherosclerotic vascular disease. Left common iliac node 1.0 cm in short axis on image 88 series 2, previously the same by my measurements. Right common iliac node 0.8 cm in short axis on image 95 series 2, previously the same by my measurements. Reproductive: Mild prostatomegaly. Other: No supplemental non-categorized findings. Musculoskeletal: Moderate degenerative hip arthropathy bilaterally. IMPRESSION: 1. No overtly pathologic adenopathy in the chest, abdomen, or pelvis, although there is a borderline enlarged but stable left common iliac node 1.0 cm in short axis. No splenomegaly 2. Subacute interval fractures of the right fourth, fifth, sixth, and seventh ribs. 3. Other imaging findings of potential clinical significance: Aortic Atherosclerosis (ICD10-I70.0). Coronary atherosclerosis. Chronic deformity of the right proximal humerus. Mild prostatomegaly. Absent right kidney. Electronically Signed   By: Van Clines M.D.   On: 01/25/2021 17:57       Assessment & Plan:   Problem List Items Addressed This Visit     Aortic atherosclerosis ([place])    Continue pravastatin.        Cardiomyopathy, idiopathic ([place]) - Primary    On lasix.  Breathing stable.  Discussed weighing daily.  Low sodium diet.  Follow.        Chronic systolic CHF (congestive heart failure) (HCC)    On lasix.  Discussed the need to follow weight.  He did find his scales.  Discussed low sodium diet.  Breathing stable.        CKD (chronic kidney disease) stage 3, GFR 30-59 ml/min (HCC)    Has been evaluated by Dr Candiss Norse.  Avoid antiinflammatories.  Follow metabolic panel.        Diabetes ([place])   Relevant Orders   Basic metabolic panel (Completed)   Lipid panel (Completed)   Diabetic foot ulcer  associated with type 1 diabetes mellitus ([place])    Followed by vascular surgery and Dr Honor Junes.  Has  insulin pump.  Doing better with sensor - regarding avoiding low sugars.  Continue f/u with vascular.  Legs wrapped.         Emphysema lung (HCC)    Breathing stable.  Follow.         Essential hypertension, benign    On coreg and lasix.  Blood pressure as outlined.  Follow pressures.  Follow metabolic panel.        Relevant Orders   TSH (Completed)   Hepatic function panel (Completed)   Hypercholesterolemia    Continue pravastatin.  Low cholesterol diet and exercise.  Follow lipid panel and liver function tests.        Lymphedema    Legs wrapped.  Sees vascular surgery regularly for wrapping and f/u.        Mantle cell lymphoma ([place])    Followed by oncology.        Pancytopenia ([place])    Has been followed by oncology.        Sick sinus syndrome [facility])    S/p pacemaker placement.  Followed by cardiology.        Thrombocytopenia ([place])   Relevant Orders   CBC with Differential/Platelet (Completed)   Thyroid disease    Followed by endocrinology.          [name], MD

## 2021-04-30 LAB — HEPATIC FUNCTION PANEL
ALT: 4 U/L (ref 0–53)
AST: 8 U/L (ref 0–37)
Albumin: 3.8 g/dL (ref 3.5–5.2)
Alkaline Phosphatase: 107 U/L (ref 39–117)
Bilirubin, Direct: 0.1 mg/dL (ref 0.0–0.3)
Total Bilirubin: 0.5 mg/dL (ref 0.2–1.2)
Total Protein: 6.4 g/dL (ref 6.0–8.3)

## 2021-04-30 LAB — CBC WITH DIFFERENTIAL/PLATELET
Basophils Absolute: 0.1 10*3/uL (ref 0.0–0.1)
Basophils Relative: 1.2 % (ref 0.0–3.0)
Eosinophils Absolute: 0.1 10*3/uL (ref 0.0–0.7)
Eosinophils Relative: 1.9 % (ref 0.0–5.0)
HCT: 39.1 % (ref 39.0–52.0)
Hemoglobin: 13.5 g/dL (ref 13.0–17.0)
Lymphocytes Relative: 20.9 % (ref 12.0–46.0)
Lymphs Abs: 1.5 10*3/uL (ref 0.7–4.0)
MCHC: 34.6 g/dL (ref 30.0–36.0)
MCV: 90.7 fl (ref 78.0–100.0)
Monocytes Absolute: 0.6 10*3/uL (ref 0.1–1.0)
Monocytes Relative: 8.9 % (ref 3.0–12.0)
Neutro Abs: 4.8 10*3/uL (ref 1.4–7.7)
Neutrophils Relative %: 67.1 % (ref 43.0–77.0)
Platelets: 149 10*3/uL — ABNORMAL LOW (ref 150.0–400.0)
RBC: 4.31 Mil/uL (ref 4.22–5.81)
RDW: 13.5 % (ref 11.5–15.5)
WBC: 7.2 10*3/uL (ref 4.0–10.5)

## 2021-04-30 LAB — BASIC METABOLIC PANEL
BUN: 21 mg/dL (ref 6–23)
CO2: 27 mEq/L (ref 19–32)
Calcium: 8.3 mg/dL — ABNORMAL LOW (ref 8.4–10.5)
Chloride: 102 mEq/L (ref 96–112)
Creatinine, Ser: 1.54 mg/dL — ABNORMAL HIGH (ref 0.40–1.50)
GFR: 44.69 mL/min — ABNORMAL LOW (ref >60.00)
Glucose, Bld: 155 mg/dL — ABNORMAL HIGH (ref 70–99)
Potassium: 3.3 mEq/L — ABNORMAL LOW (ref 3.5–5.1)
Sodium: 140 mEq/L (ref 135–145)

## 2021-04-30 LAB — TSH: TSH: 1.81 u[IU]/mL (ref 0.35–5.50)

## 2021-04-30 LAB — LIPID PANEL
Cholesterol: 126 mg/dL (ref 0–200)
HDL: 44.2 mg/dL (ref >39.00)
LDL Cholesterol: 72 mg/dL (ref 0–99)
NonHDL: 81.98
Total CHOL/HDL Ratio: 3
Triglycerides: 50 mg/dL (ref 0.0–149.0)
VLDL: 10 mg/dL (ref 0.0–40.0)

## 2021-05-01 ENCOUNTER — Inpatient Hospital Stay: Payer: Medicare Other | Attending: Oncology

## 2021-05-01 ENCOUNTER — Other Ambulatory Visit: Payer: Self-pay

## 2021-05-01 ENCOUNTER — Encounter (INDEPENDENT_AMBULATORY_CARE_PROVIDER_SITE_OTHER): Payer: Self-pay

## 2021-05-01 ENCOUNTER — Ambulatory Visit (INDEPENDENT_AMBULATORY_CARE_PROVIDER_SITE_OTHER): Payer: Medicare Other | Admitting: Nurse Practitioner

## 2021-05-01 VITALS — BP 154/70 | HR 73 | Resp 16 | Wt 229.4 lb

## 2021-05-01 DIAGNOSIS — Z452 Encounter for adjustment and management of vascular access device: Secondary | ICD-10-CM | POA: Diagnosis not present

## 2021-05-01 DIAGNOSIS — Z95828 Presence of other vascular implants and grafts: Secondary | ICD-10-CM

## 2021-05-01 DIAGNOSIS — C8310 Mantle cell lymphoma, unspecified site: Secondary | ICD-10-CM | POA: Insufficient documentation

## 2021-05-01 DIAGNOSIS — I890 Lymphedema, not elsewhere classified: Secondary | ICD-10-CM

## 2021-05-01 MED ORDER — HEPARIN SOD (PORK) LOCK FLUSH 100 UNIT/ML IV SOLN
500.0000 [IU] | Freq: Once | INTRAVENOUS | Status: DC
  Filled 2021-05-01: qty 5

## 2021-05-01 MED ORDER — SODIUM CHLORIDE 0.9% FLUSH
10.0000 mL | Freq: Once | INTRAVENOUS | Status: DC
  Filled 2021-05-01: qty 10

## 2021-05-01 NOTE — Progress Notes (Signed)
History of Present Illness  There is no documented history at this time  Assessments & Plan   There are no diagnoses linked to this encounter.    Additional instructions  Subjective:  Patient presents with venous ulcer of the Bilateral lower extremity.    Procedure:  3 layer unna wrap was placed Bilateral lower extremity.   Plan:   Follow up in one week.  

## 2021-05-01 NOTE — Patient Instructions (Signed)
[facility]  Discharge Instructions: Thank you for choosing [facility] to provide your oncology and hematology care.  If you have a lab appointment with the [facility], please go directly to the [facility] and check in at the registration area.  Wear comfortable clothing and clothing appropriate for easy access to any Portacath or PICC line.   We strive to give you quality time with your provider. You may need to reschedule your appointment if you arrive late (15 or more minutes).  Arriving late affects you and other patients whose appointments are after yours.  Also, if you miss three or more appointments without notifying the office, you may be dismissed from the clinic at the provider's discretion.      For prescription refill requests, have your pharmacy contact our office and allow 72 hours for refills to be completed.    Today you received the following chemotherapy and/or immunotherapy agents port a cath flush       To help prevent nausea and vomiting after your treatment, we encourage you to take your nausea medication as directed.  BELOW ARE SYMPTOMS THAT SHOULD BE REPORTED IMMEDIATELY: *FEVER GREATER THAN 100.4 F (38 C) OR HIGHER *CHILLS OR SWEATING *NAUSEA AND VOMITING THAT IS NOT CONTROLLED WITH YOUR NAUSEA MEDICATION *UNUSUAL SHORTNESS OF BREATH *UNUSUAL BRUISING OR BLEEDING *URINARY PROBLEMS (pain or burning when urinating, or frequent urination) *BOWEL PROBLEMS (unusual diarrhea, constipation, pain near the anus) TENDERNESS IN MOUTH AND THROAT WITH OR WITHOUT PRESENCE OF ULCERS (sore throat, sores in mouth, or a toothache) UNUSUAL RASH, SWELLING OR PAIN  UNUSUAL VAGINAL DISCHARGE OR ITCHING   Items with * indicate a potential emergency and should be followed up as soon as possible or go to the Emergency Department if any problems should occur.  Please show the CHEMOTHERAPY ALERT CARD or IMMUNOTHERAPY ALERT CARD at  check-in to the Emergency Department and triage nurse.  Should you have questions after your visit or need to cancel or reschedule your appointment, please contact [facility]  [phone] and follow the prompts.  Office hours are 8:00 a.m. to 4:30 p.m. Monday - Friday. Please note that voicemails left after 4:00 p.m. may not be returned until the following business day.  We are closed weekends and major holidays. You have access to a nurse at all times for urgent questions. Please call the main number to the clinic [phone] and follow the prompts.  For any non-urgent questions, you may also contact your provider using MyChart. We now offer e-Visits for anyone 76 and older to request care online for non-urgent symptoms. For details visit [email or web address].   Also download the MyChart app! Go to the app store, search "MyChart", open the app, select [facility], and log in with your MyChart username and password.  Due to Covid, a mask is required upon entering the hospital/clinic. If you do not have a mask, one will be given to you upon arrival. For doctor visits, patients may have 1 support person aged 53 or older with them. For treatment visits, patients cannot have anyone with them due to current Covid guidelines and our immunocompromised population.   Implanted Port Insertion Implanted port insertion is a procedure to put in a port and catheter. The port is a device with an injectable disk that can be accessed by your health care provider. The port is connected to a vein in the chest or neck by a small flexible tube (  catheter). There are different types of ports. The implanted port may be used as a long-term IV access for: Medicines, such as chemotherapy. Fluids. Liquid nutrition, such as total parenteral nutrition (TPN). When you have a port, your health care provider can choose to use the portinstead of veins in your arms for these procedures. Tell a  health care provider about: Any allergies you have. All medicines you are taking, especially blood thinners, as well as any vitamins, herbs, eye drops, creams, over-the-counter medicines, and steroids. Any problems you or family members have had with anesthetic medicines. Any blood disorders you have. Any surgeries you have had. Any medical conditions you have or have had, including diabetes or kidney problems. Whether you are pregnant or may be pregnant. What are the risks? Generally, this is a safe procedure. However, problems may occur, including: Allergic reactions to medicines or dyes. Damage to other structures or organs. Infection. Damage to the blood vessel, bruising, or bleeding at the puncture site. Blood clot. Breakdown of the skin over the port. A collection of air in the chest that can cause one of the lungs to collapse (pneumothorax). This is rare. What happens before the procedure? Staying hydrated Follow instructions from your health care provider about hydration, which may include: Up to 2 hours before the procedure - you may continue to drink clear liquids, such as water, clear fruit juice, black coffee, and plain tea.  Eating and drinking restrictions Follow instructions from your health care provider about eating and drinking, which may include: 8 hours before the procedure - stop eating heavy meals or foods, such as meat, fried foods, or fatty foods. 6 hours before the procedure - stop eating light meals or foods, such as toast or cereal. 6 hours before the procedure - stop drinking milk or drinks that contain milk. 2 hours before the procedure - stop drinking clear liquids. Medicines Ask your health care provider about: Changing or stopping your regular medicines. This is especially important if you are taking diabetes medicines or blood thinners. Taking medicines such as aspirin and ibuprofen. These medicines can thin your blood. Do not take these medicines  unless your health care provider tells you to take them. Taking over-the-counter medicines, vitamins, herbs, and supplements. General instructions Plan to have someone take you home from the hospital or clinic. If you will be going home right after the procedure, plan to have someone with you for 24 hours. You may have blood tests. Do not use any products that contain nicotine or tobacco for at least 4-6 weeks before the procedure. These products include cigarettes, e-cigarettes, and chewing tobacco. If you need help quitting, ask your health care provider. Ask your health care provider what steps will be taken to help prevent infection. These may include: Removing hair at the surgery site. Washing skin with a germ-killing soap. Taking antibiotic medicine. What happens during the procedure?  An IV will be inserted into one of your veins. You will be given one or more of the following: A medicine to help you relax (sedative). A medicine to numb the area (local anesthetic). Two small incisions will be made to insert the port. One smaller incision will be made in your neck to get access to the vein where the catheter will lie. The other incision will be made in the upper chest. This is where the port will lie. The procedure may be done using continuous X-ray (fluoroscopy) or other imaging tools for guidance. The port and catheter will be  placed. There may be a small, raised area where the port is. The port will be flushed with a salt solution (saline), and blood will be drawn to make sure that it is working correctly. The incisions will be closed. Bandages (dressings) may be placed over the incisions. The procedure may vary among health care providers and hospitals. What happens after the procedure? Your blood pressure, heart rate, breathing rate, and blood oxygen level will be monitored until you leave the hospital or clinic. Do not drive for 24 hours if you were given a sedative during  your procedure. You will be given a manufacturer's information card for the type of port that you have. Keep this with you. Your port will need to be flushed and checked as told by your health care provider, usually every few weeks. A chest X-ray will be done to: Check the placement of the port. Make sure there is no injury to your lung. Summary Implanted port insertion is a procedure to put in a port and catheter. The implanted port is used as a long-term IV access. The port will need to be flushed and checked as told by your health care provider, usually every few weeks. Keep your manufacturer's information card with you at all times. This information is not intended to replace advice given to you by your health care provider. Make sure you discuss any questions you have with your healthcare provider. Document Revised: 11/09/2019 Document Reviewed: 04/26/2018 Elsevier Patient Education  [place].

## 2021-05-02 ENCOUNTER — Ambulatory Visit (INDEPENDENT_AMBULATORY_CARE_PROVIDER_SITE_OTHER): Payer: Medicare Other | Admitting: *Deleted

## 2021-05-02 ENCOUNTER — Telehealth: Payer: Self-pay

## 2021-05-02 DIAGNOSIS — E1151 Type 2 diabetes mellitus with diabetic peripheral angiopathy without gangrene: Secondary | ICD-10-CM | POA: Diagnosis not present

## 2021-05-02 DIAGNOSIS — I5022 Chronic systolic (congestive) heart failure: Secondary | ICD-10-CM

## 2021-05-02 NOTE — Chronic Care Management (AMB) (Signed)
Chronic Care Management   CCM RN Visit Note  05/02/2021 Name: Gary [name] MRN: [id number] DOB: 02-20-1946  Subjective: Gary [name] is a 75 y.o. year old male who is a primary care patient of [name], MD. The care management team was consulted for assistance with disease management and care coordination needs.    Engaged with patient by telephone for follow up visit in response to provider referral for case management and/or care coordination services.   Consent to Services:  The patient was given information about Chronic Care Management services, agreed to services, and gave verbal consent prior to initiation of services.  Please see initial visit note for detailed documentation.   Patient agreed to services and verbal consent obtained.   Assessment: Review of patient past medical history, allergies, medications, health status, including review of consultants reports, laboratory and other test data, was performed as part of comprehensive evaluation and provision of chronic care management services.   SDOH (Social Determinants of Health) assessments and interventions performed:    CCM Care Plan  Allergies  Allergen Reactions   Rofecoxib Nausea Only    Outpatient Encounter Medications as of 05/02/2021  Medication Sig   furosemide (LASIX) 40 MG tablet Take 40 mg by mouth 2 (two) times daily. 2 tablets in the morning 1 tablet in the afternoon as needed   carvedilol (COREG) 3.125 MG tablet Take 1 tablet by mouth 2 (two) times daily with a meal.   Cholecalciferol (VITAMIN D3) 50 MCG (2000 UT) CAPS Take by mouth.   ferrous sulfate 325 (65 FE) MG tablet Take 325 mg by mouth daily with breakfast.   gabapentin (NEURONTIN) 100 MG capsule    glucose 4 GM chewable tablet Chew 1 tablet (4 g total) by mouth once as needed for low blood sugar.   insulin aspart (NOVOLOG) 100 UNIT/ML injection Basal rate 12am 0.8 units per hour, 9am 0.9 units per hour. (total basal insulin 20.7 units).  Carbohydrate ratio 1 units for every 8gm of carbohydrate. Correction factor 1 units for every 50mg /dl over target cbg. Target CBG 80-120   levothyroxine (SYNTHROID) 25 MCG tablet Take 25 mcg by mouth daily before breakfast.   nystatin cream (MYCOSTATIN) APPLY TO AFFECTED AREA TWICE A DAY   potassium chloride (KLOR-CON) 10 MEQ tablet TAKE 1 TABLET BY MOUTH TWICE DAILY FOR 3 DAYS THEN CONTINUE ONCE DAILY   pravastatin (PRAVACHOL) 40 MG tablet Take 40 mg by mouth daily.    spironolactone (ALDACTONE) 25 MG tablet    vitamin B-12 (CYANOCOBALAMIN) 1000 MCG tablet Take 1,000 mcg by mouth daily.   Facility-Administered Encounter Medications as of 05/02/2021  Medication   heparin lock flush 100 unit/mL   sodium chloride flush (NS) 0.9 % injection 10 mL   sodium chloride flush (NS) 0.9 % injection 10 mL    Patient Active Problem List   Diagnosis Date Noted   Thrombocytopenia ([facility]) 04/29/2021   Splinter of foot without infection 02/23/2021   Arm laceration 02/09/2021   Hypokalemia 10/24/2020   Injury by nail 08/15/2020   Shingles 07/24/2020   Rash 07/02/2020   Colon cancer screening 07/02/2020   Sick sinus syndrome ([facility]) 12/28/2019   Acquired absence of kidney 09/13/2019   Edema of lower extremity 09/13/2019   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, or unspecified 09/13/2019   Proteinuria 09/13/2019   Lower extremity pain, bilateral    Diabetic foot ulcer associated with type 1 diabetes mellitus ([facility])  Acute on chronic systolic CHF (congestive heart failure) ([facility])    Cellulitis 08/18/2019   Lower limb ulcer, calf ([facility]) 08/01/2019   Left hip pain 07/16/2019   Pain due to onychomycosis of toenails of both feet 04/03/2019   Swelling of limb 02/07/2019   Pancytopenia ([facility]) 12/03/2018   CKD (chronic kidney disease) stage 3, GFR 30-59 ml/min (HCC) 09/16/2018   Left shoulder pain 09/16/2018   Chronic cholecystitis 12/29/2017   Graves  disease 12/29/2017   Peripheral neuropathy 12/29/2017   S/p nephrectomy 12/29/2017   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   Symptomatic bradycardia 12/02/2017   AKI (acute kidney injury) ([facility]) 12/75/1700   Chronic systolic CHF (congestive heart failure) ([place]) 12/02/2017   Saturday night paralysis 11/12/2017   Hypoglycemia 10/28/2017   Goals of care, counseling/discussion 10/24/2017   Lymphedema 10/20/2017   Iron deficiency anemia 09/11/2017   Fall 08/13/2017   Humerus fracture 08/13/2017   Anemia 01/12/2017   Bilateral carotid artery stenosis 06/29/2016   Hand laceration 12/16/2015   Adjustment disorder with depressed mood 08/11/2015   Cardiomyopathy, idiopathic ([place]) 04/22/2015   CAD in native artery 11/02/2014   Mantle cell lymphoma ([place]) 08/03/2014   History of colonic polyps 04/29/2014   Irregular heart beat 04/22/2014   SOB (shortness of breath) 04/22/2014   Stress 03/21/2014   PVC (premature ventricular contraction) 02/21/2014   GERD (gastroesophageal reflux disease) 10/23/2013   Gastroparesis 06/25/2013   Chest pain 04/13/2013   Thyroid disease 04/13/2013   Diabetes ([facility]) 04/13/2013   Essential hypertension, benign 04/13/2013   Hypercholesterolemia 04/13/2013    Conditions to be addressed/monitored:CHF and DMII  Care Plan : Heart Failure (Adult)  Updates made by Leona Singleton, RN since 05/02/2021 12:00 AM     Problem: Disease Progression (Heart Failure)   Priority: Medium     Long-Range Goal: Patient will report no heart failure exacerbations in the next 90 days   Start Date: 12/06/2020  Expected End Date: 10/10/2021  This Visit's Progress: On track  Recent Progress: Not on track  Priority: Medium  Note:   Current Barriers:  Knowledge deficit related to basic heart failure pathophysiology and self care management as evidenced by continued lower extremity edema requiring Unna Boots (changed every Thursday at Vascular office).  Patient reports he has found  his scale and is now weighing about every other day.  Weight this morning was 228.8 pounds (104 kg).  States he now needs to figure out how to change the scale to read in pounds instead of kilograms.  Reports lower extremity edema is about the same (better than a year ago) and denies any increase in shortness of breath at this time. Patient does not have transportation to provider appointments Transportation Barriers; going to use [facility] transportation for appointments within [facility] system and using [employer] for other medical appointments Lacks social connections Unable to perform IADLs independently Case Manager Clinical Goal(s):  patient will verbalize understanding of Heart Failure Action Plan and when to call doctor patient will take all Heart Failure mediations as prescribed patient will weigh daily and record (notifying MD of 3 lb weight gain over night or 5 lb in a week) Interventions:  Collaboration with [name], MD regarding development and update of comprehensive plan of care as evidenced by provider attestation and co-signature Inter-disciplinary care team collaboration (see longitudinal plan of care) Provided verbal education on low sodium diet Advised patient to weigh each morning after emptying bladder Discussed importance of daily weight and advised patient to  weigh and record daily and encouraged patient to do so; Reinforced need to weight daily and encouraged patient to use walker in front of scale to help balance while standing on scale Congratulated patient on finding scale at home, discussed importance of daily weight monitoring, when to weigh, and when to call provider based on weight  Congratulated on finding walker so it can be used to stand on scale, also encouraged to use walker with ambulation Reviewed signs and symptoms of heart failure exacerbation Encouraged elevation of lower extremities as able Encouraged continued use of [facility] and [employer] ride  transportation Discussed medications and encouraged medication compliance Encouraged to use 2022 [place] previously sent to help with logging of weights and other vital signs Discussed importance of weighing at home and not just at providers office Encouraged to use walker with all ambulation, fall precautions and preventions reviewed and discussed Patient Goals/Self-Care Activities Call office if I gain more than 2 pounds in one day or 5 pounds in one week Use salt in moderation, low salt heart healthy diabetic diet Congratulations on finding scale at home Weigh myself daily and write in log for provider review (at least start to weigh a few times a week working way up to daily) Take medications as prescribed Place walker in front of scale to help balance self while standing to weigh (please consider trying this to weigh yourself) Follow Up Plan: The care management team will reach out to the patient again over the next 30 business days.     Care Plan : Diabetes  Updates made by Leona Singleton, RN since 05/02/2021 12:00 AM     Problem: Hypoglycemia causing syncopal episodes   Priority: Medium     Long-Range Goal: Patient will report maintaing Hgb A1C of 7 or below in the next 90 days   Start Date: 01/27/2021  Expected End Date: 10/10/2021  This Visit's Progress: On track  Recent Progress: On track  Priority: Medium  Note:   Objective:  Lab Results  Component Value Date   HGBA1C 7.0 01/23/2021  Current Barriers:  Knowledge Deficits related to basic Diabetes pathophysiology and self care/management as evidenced by multiple syncopal episodes related to hypoglycemia.  Latest Hgb A1C 6.9 on 04/29/21.  Patient reports fasting blood sugar of 180 this morning with recent fasting ranges 80-200's.  Does report needig to turn off insulin pump a few times in the last few weeks to treat his hypoglycemia.  Does report right foot tenderness where splinter was and feeling like small  piece of splinter still there.  States he attempted to remove rest of splinter last night, not sure if anything was removed.  Denies any drainage or signs and symptoms of infection.  Encouraged patient to have nurses at vascular office to check foot during each Unna Boot change.  Denies fall in the last 2 weeks. Limited Social Support Trouble getting insulin pump supplies-reports he now has insulin pump sensors Case Manager Clinical Goal(s):  patient will demonstrate improved adherence to prescribed treatment plan for diabetes self care/management as evidenced by: at least 4 times a day monitoring and recording of CBG, adherence to ADA/ carb modified diet, adherence to prescribed medication regimen, contacting provider for new or worsened symptoms or questions Interventions:  Collaboration with [name], MD regarding development and update of comprehensive plan of care as evidenced by provider attestation and co-signature Inter-disciplinary care team collaboration (see longitudinal plan of care) Provided education to patient about basic DM disease  process Discussed medications and encouraged medication compliance Discussed plans with patient for ongoing care management follow up and provided patient with direct contact information for care management team Encouraged continued participation with CCM Pharmacist  Reviewed glucose ranges and hypoglycemic events; and discussed dangers of hypoglycemia and treatment options, need for immediate sugar replacement with juice, sugar, soda in addition to snack or candy bar or glucose tablets Discussed proper treatment of hypoglycemia Reviewed scheduled/upcoming provider appointments including:  Endocrinology 7/19, PCP 7/19; confirmed patient is aware of both appointments on the same day (patient stated he is aware and plans to arrange transportation for both) Discussed healthy meal and snack options Rediscussed possible [facility] or Therapy  referral for safety evaluation after 2 recent falls, educating and discussing even if they start services, patient can cancel at any time (patient still declines at this time) Discussed and encouraged patient to be proactive with health instead of reactive after falls Fall precautions and preventions reviewed and discussed, encouraged to use walker with all ambulation,  Instructed and encouraged to wear life alert at all times Discussed logging blood sugars and writing down insulin doses taken to help provider manage medications; sent 2022 Calendar Booklet to help with keeping log of blood sugars and insulin doses Reviewed and discussed proper diabetic foot care, encouraged patient to keep check on right foot splinter wound for signs and symptoms of infection Discussed and encouraged and reinforced patient to wear bedroom/house/slides while in the home and not to walk around bare foot Discussed with patient asking podiatrist for prescription for diabetic shoes Encouraged to contact Endocrinology if he has to turn insulin pump off again for hypoglycemia Encouraged patient to have staff changing unna boots check foot for infection after splinter removal Patient Goals/Self-Care Activities Check blood sugar at least 5 times a day Check blood sugar if I feel it is too high or too low Take the blood sugar meter and log to all doctor visits  Consider drinking Boost or other supplement in the evenings if not eating a complete meal Make sure to have candy or glucose tablets on you at all times; try drinking coke along with eating snack to treat hypoglycemia Eat at least 3 meals a day with snack in between to help prevent hypoglycemia Please consider Home Health Nursing/Therapy involvement Fall precautions and preventions; Wear life alert at all times Request staff at Vascular/Vein office look at wound on foot (splinter) Follow Up Plan: The care management team will reach out to the patient again over the  next 30 business days.        Plan:The care management team will reach out to the patient again over the next 30 business days.  [name] RN, MSN RN Care Management Coordinator [facility] [phone] [email or web address]

## 2021-05-02 NOTE — Patient Instructions (Signed)
Visit Information  PATIENT GOALS:  Goals Addressed             This Visit's Progress    (RNCM) Monitor and Manage My Blood Sugar-Diabetes Type 1   On track    Timeframe:  Long-Range Goal Priority:  High Start Date:   12/06/20                          Expected End Date:  10/10/21                     Follow Up Date 05/23/2021    Check blood sugar at least 5 times a day Check blood sugar if I feel it is too high or too low Take the blood sugar meter and log to all doctor visits  Consider drinking Boost or other supplement in the evenings if not eating a complete meal Make sure to have candy or glucose tablets on you at all times; try drinking coke along with eating snack to treat hypoglycemia Eat at least 3 meals a day with snack in between to help prevent hypoglycemia Please consider Home Health Nursing/Therapy involvement Fall precautions and preventions; Wear life alert at all times Request staff at Vascular/Vein office look at wound on foot (splinter)   Why is this important?   Checking your blood sugar at home helps to keep it from getting very high or very low.  Writing the results in a diary or log helps the doctor know how to care for you.  Your blood sugar log should have the time, the date and the results.  Also, write down the amount of insulin or other medicine you take.  Other information like what you ate, exercise done and how you were feeling will also be helpful..     Notes:      (RNCM) Track and Manage Fluids and Swelling-Heart Failure   On track    Timeframe:  Long-Range Goal Priority:  Medium Start Date:   12/06/20                          Expected End Date:  10/10/21                     Follow Up Date 05/23/21   Call office if I gain more than 2 pounds in one day or 5 pounds in one week Use salt in moderation, low salt heart healthy diabetic diet Congratulations on finding scale at home Weigh myself daily and write in log for provider review (at least  start to weigh a few times a week working way up to daily) Take medications as prescribed Place walker in front of scale to help balance self while standing to weigh (please consider trying this to weigh yourself)   Why is this important?   It is important to check your weight daily and watch how much salt and liquids you have.  It will help you to manage your heart failure.    Notes:         Patient verbalizes understanding of instructions provided today and agrees to view in [place].   The care management team will reach out to the patient again over the next 30 business days.   [name] RN, MSN RN Care Management Coordinator [place] [phone] [email or web address]

## 2021-05-02 NOTE — Telephone Encounter (Signed)
Met B has been ordered for future labs.

## 2021-05-05 ENCOUNTER — Encounter: Payer: Self-pay | Admitting: Internal Medicine

## 2021-05-05 DIAGNOSIS — I700 Atherosclerosis of aorta: Secondary | ICD-10-CM | POA: Insufficient documentation

## 2021-05-05 DIAGNOSIS — J439 Emphysema, unspecified: Secondary | ICD-10-CM | POA: Insufficient documentation

## 2021-05-05 NOTE — Assessment & Plan Note (Signed)
S/p pacemaker placement.  Followed by cardiology.  

## 2021-05-05 NOTE — Assessment & Plan Note (Signed)
On lasix.  Discussed the need to follow weight.  He did find his scales.  Discussed low sodium diet.  Breathing stable.

## 2021-05-05 NOTE — Assessment & Plan Note (Signed)
Continue pravastatin 

## 2021-05-05 NOTE — Assessment & Plan Note (Signed)
Continue pravastatin.  Low cholesterol diet and exercise.  Follow lipid panel and liver function tests.   

## 2021-05-05 NOTE — Assessment & Plan Note (Signed)
On lasix.  Breathing stable.  Discussed weighing daily.  Low sodium diet.  Follow.  °

## 2021-05-05 NOTE — Assessment & Plan Note (Signed)
On coreg and lasix.  Blood pressure as outlined.  Follow pressures.  Follow metabolic panel.  

## 2021-05-05 NOTE — Assessment & Plan Note (Signed)
Breathing stable.  Follow.    

## 2021-05-05 NOTE — Assessment & Plan Note (Signed)
Followed by oncology 

## 2021-05-05 NOTE — Assessment & Plan Note (Signed)
Has been followed by oncology.  

## 2021-05-05 NOTE — Assessment & Plan Note (Signed)
Followed by endocrinology 

## 2021-05-05 NOTE — Assessment & Plan Note (Signed)
Has been evaluated by Dr Singh.  Avoid antiinflammatories.  Follow metabolic panel.  

## 2021-05-05 NOTE — Assessment & Plan Note (Signed)
Followed by vascular surgery and Dr Honor Junes.  Has insulin pump.  Doing better with sensor - regarding avoiding low sugars.  Continue f/u with vascular.  Legs wrapped.

## 2021-05-05 NOTE — Assessment & Plan Note (Signed)
Legs wrapped.  Sees vascular surgery regularly for wrapping and f/u.  °

## 2021-05-08 ENCOUNTER — Encounter (INDEPENDENT_AMBULATORY_CARE_PROVIDER_SITE_OTHER): Payer: Self-pay

## 2021-05-08 ENCOUNTER — Other Ambulatory Visit: Payer: Self-pay

## 2021-05-08 ENCOUNTER — Ambulatory Visit (INDEPENDENT_AMBULATORY_CARE_PROVIDER_SITE_OTHER): Payer: Medicare Other | Admitting: Nurse Practitioner

## 2021-05-08 VITALS — BP 127/79 | HR 71 | Resp 16 | Wt 227.6 lb

## 2021-05-08 DIAGNOSIS — I890 Lymphedema, not elsewhere classified: Secondary | ICD-10-CM | POA: Diagnosis not present

## 2021-05-08 NOTE — Progress Notes (Signed)
History of Present Illness  There is no documented history at this time  Assessments & Plan   There are no diagnoses linked to this encounter.    Additional instructions  Subjective:  Patient presents with venous ulcer of the Bilateral lower extremity.    Procedure:  3 layer unna wrap was placed Bilateral lower extremity.   Plan:   Follow up in one week.  

## 2021-05-12 ENCOUNTER — Telehealth: Payer: Medicare Other

## 2021-05-13 ENCOUNTER — Encounter: Payer: Self-pay | Admitting: Internal Medicine

## 2021-05-13 ENCOUNTER — Other Ambulatory Visit: Payer: Self-pay | Admitting: Internal Medicine

## 2021-05-13 NOTE — Telephone Encounter (Signed)
If persistent symptoms, needs to be evaluated.  Ok to schedule virtual visit with me.  I do not think the lasix is contributing to the diarrhea.  I want him to continue lasix.  Recommend probiotic daily.  Appt as above.

## 2021-05-15 ENCOUNTER — Encounter: Payer: Self-pay | Admitting: Podiatry

## 2021-05-15 ENCOUNTER — Ambulatory Visit (INDEPENDENT_AMBULATORY_CARE_PROVIDER_SITE_OTHER): Payer: Medicare Other | Admitting: Nurse Practitioner

## 2021-05-15 ENCOUNTER — Ambulatory Visit (INDEPENDENT_AMBULATORY_CARE_PROVIDER_SITE_OTHER): Payer: Medicare Other | Admitting: Podiatry

## 2021-05-15 ENCOUNTER — Other Ambulatory Visit: Payer: Self-pay

## 2021-05-15 VITALS — BP 118/77 | HR 76 | Ht 68.0 in | Wt 228.0 lb

## 2021-05-15 DIAGNOSIS — I890 Lymphedema, not elsewhere classified: Secondary | ICD-10-CM | POA: Diagnosis not present

## 2021-05-15 DIAGNOSIS — M79674 Pain in right toe(s): Secondary | ICD-10-CM

## 2021-05-15 DIAGNOSIS — B351 Tinea unguium: Secondary | ICD-10-CM

## 2021-05-15 DIAGNOSIS — N179 Acute kidney failure, unspecified: Secondary | ICD-10-CM

## 2021-05-15 DIAGNOSIS — Q828 Other specified congenital malformations of skin: Secondary | ICD-10-CM | POA: Diagnosis not present

## 2021-05-15 DIAGNOSIS — M79675 Pain in left toe(s): Secondary | ICD-10-CM

## 2021-05-15 DIAGNOSIS — E1151 Type 2 diabetes mellitus with diabetic peripheral angiopathy without gangrene: Secondary | ICD-10-CM

## 2021-05-15 NOTE — Progress Notes (Signed)
This patient returns to my office for at risk foot care.  This patient requires this care by a professional since this patient will be at risk due to having chronic kidney disease and diabetes.  This patient is unable to cut nails himself since the patient cannot reach his nails.These nails are painful walking and wearing shoes. This patient is wearing unna boots on both legs/feet.  He also says he has stepped on a splinter in his right foot.  He also says he drew blood on his left forefoot this morning walking in the bathroom.  This patient presents for at risk foot care today.  General Appearance  Alert, conversant and in no acute stress.  Vascular  Deferred due to unna boots.  Neurologic  Deferred due to unna boots.  Nails Thick disfigured discolored nails with subungual debris  from hallux to fifth toes bilaterally. No evidence of bacterial infection or drainage bilaterally.   Orthopedic  No limitations of motion  feet .  No crepitus or effusions noted.  No bony pathology or digital deformities noted.  Skin  Porokeratosis sub 4 right foot.   No signs of infections or ulcers noted.     Onychomycosis  Pain in right toes  Pain in left toes  Consent was obtained for treatment procedures.   Mechanical debridement of nails 1-5  bilaterally performed with a nail nipper.  Filed with dremel without incident. Debridement of porokeratosis right foot.  Cleaned blood from his left forefoot and no evidence of skin laceration noted.  The fifth toenail was noted to have blood also.  I assume blood came from this fifth toenail left foot.     Return office visit    3 months                 Told patient to return for periodic foot care and evaluation due to potential at risk complications.   [name] DPM

## 2021-05-15 NOTE — Progress Notes (Signed)
History of Present Illness  There is no documented history at this time  Assessments & Plan   There are no diagnoses linked to this encounter.    Additional instructions  Subjective:  Patient presents with venous ulcer of the Bilateral lower extremity.    Procedure:  3 layer unna wrap was placed Bilateral lower extremity.   Plan:   Follow up in one week.  

## 2021-05-16 NOTE — Addendum Note (Signed)
Addended by: Leeanne Rio on: 05/16/2021 04:36 PM   Modules accepted: Orders

## 2021-05-20 ENCOUNTER — Other Ambulatory Visit (INDEPENDENT_AMBULATORY_CARE_PROVIDER_SITE_OTHER): Payer: Medicare Other

## 2021-05-20 ENCOUNTER — Other Ambulatory Visit: Payer: Self-pay

## 2021-05-20 DIAGNOSIS — E1151 Type 2 diabetes mellitus with diabetic peripheral angiopathy without gangrene: Secondary | ICD-10-CM | POA: Diagnosis not present

## 2021-05-21 LAB — BASIC METABOLIC PANEL WITH GFR
BUN: 19 mg/dL (ref 6–23)
CO2: 26 meq/L (ref 19–32)
Calcium: 8.1 mg/dL — ABNORMAL LOW (ref 8.4–10.5)
Chloride: 101 meq/L (ref 96–112)
Creatinine, Ser: 1.46 mg/dL (ref 0.40–1.50)
GFR: 47.63 mL/min — ABNORMAL LOW
Glucose, Bld: 161 mg/dL — ABNORMAL HIGH (ref 70–99)
Potassium: 3.3 meq/L — ABNORMAL LOW (ref 3.5–5.1)
Sodium: 139 meq/L (ref 135–145)

## 2021-05-22 ENCOUNTER — Ambulatory Visit (INDEPENDENT_AMBULATORY_CARE_PROVIDER_SITE_OTHER): Payer: Medicare Other | Admitting: Nurse Practitioner

## 2021-05-22 ENCOUNTER — Other Ambulatory Visit: Payer: Self-pay

## 2021-05-22 ENCOUNTER — Telehealth: Payer: Self-pay

## 2021-05-22 ENCOUNTER — Encounter (INDEPENDENT_AMBULATORY_CARE_PROVIDER_SITE_OTHER): Payer: Self-pay | Admitting: Nurse Practitioner

## 2021-05-22 ENCOUNTER — Encounter (INDEPENDENT_AMBULATORY_CARE_PROVIDER_SITE_OTHER): Payer: Self-pay

## 2021-05-22 VITALS — BP 150/75 | HR 74 | Resp 16 | Wt 223.8 lb

## 2021-05-22 DIAGNOSIS — I890 Lymphedema, not elsewhere classified: Secondary | ICD-10-CM

## 2021-05-22 NOTE — Telephone Encounter (Signed)
LMTCB for lab results.  

## 2021-05-22 NOTE — Progress Notes (Signed)
History of Present Illness  There is no documented history at this time  Assessments & Plan   There are no diagnoses linked to this encounter.    Additional instructions  Subjective:  Patient presents with venous ulcer of the Bilateral lower extremity.    Procedure:  3 layer unna wrap was placed Bilateral lower extremity.   Plan:   Follow up in one week.  

## 2021-05-23 ENCOUNTER — Ambulatory Visit (INDEPENDENT_AMBULATORY_CARE_PROVIDER_SITE_OTHER): Payer: Medicare Other | Admitting: *Deleted

## 2021-05-23 DIAGNOSIS — I5022 Chronic systolic (congestive) heart failure: Secondary | ICD-10-CM

## 2021-05-23 DIAGNOSIS — E1151 Type 2 diabetes mellitus with diabetic peripheral angiopathy without gangrene: Secondary | ICD-10-CM

## 2021-05-23 NOTE — Patient Instructions (Signed)
Visit Information  PATIENT GOALS:  Goals Addressed             This Visit's Progress    (RNCM) Monitor and Manage My Blood Sugar-Diabetes Type 1   On track    Timeframe:  Long-Range Goal Priority:  High Start Date:   12/06/20                          Expected End Date:  10/10/21                     Follow Up Date 06/11/2021    Check blood sugar at least 5 times a day Check blood sugar if I feel it is too high or too low Take the blood sugar meter and log to all doctor visits  Consider drinking Boost or other supplement in the evenings if not eating a complete meal Make sure to have candy or glucose tablets on you at all times; try drinking coke along with eating snack to treat hypoglycemia Eat at least 3 meals a day with snack in between to help prevent hypoglycemia Please consider Home Health Nursing/Therapy involvement Fall precautions and preventions; Wear life alert at all times Contact Endocrinology for sustained hypoglycemia   Why is this important?   Checking your blood sugar at home helps to keep it from getting very high or very low.  Writing the results in a diary or log helps the doctor know how to care for you.  Your blood sugar log should have the time, the date and the results.  Also, write down the amount of insulin or other medicine you take.  Other information like what you ate, exercise done and how you were feeling will also be helpful..     Notes:      (RNCM) Track and Manage Fluids and Swelling-Heart Failure   On track    Timeframe:  Long-Range Goal Priority:  Medium Start Date:   12/06/20                          Expected End Date:  10/10/21                     Follow Up Date 06/11/21   Call office if I gain more than 2 pounds in one day or 5 pounds in one week Use salt in moderation, low salt heart healthy diabetic diet Congratulations on finding scale at home Weigh myself daily and write in log for provider review (at least start to weigh a few  times a week working way up to daily) Take medications as prescribed Place walker in front of scale to help balance self while standing to weigh (please consider trying this to weigh yourself)   Why is this important?   It is important to check your weight daily and watch how much salt and liquids you have.  It will help you to manage your heart failure.    Notes:         Patient verbalizes understanding of instructions provided today and agrees to view in [place].   The care management team will reach out to the patient again over the next 30 business days.   [name] RN, MSN RN Care Management Coordinator [place] [phone] [email or web address]

## 2021-05-23 NOTE — Telephone Encounter (Signed)
Patient is returning your call.  

## 2021-05-23 NOTE — Telephone Encounter (Signed)
See result note.  

## 2021-05-23 NOTE — Chronic Care Management (AMB) (Signed)
Chronic Care Management   CCM RN Visit Note  05/23/2021 Name: Gary [name] MRN: [id number] DOB: [birth year]  Subjective: Gary [name] is a 75 y.o. year old male who is a primary care patient of [name], MD. The care management team was consulted for assistance with disease management and care coordination needs.    Engaged with patient by telephone for follow up visit in response to provider referral for case management and/or care coordination services.   Consent to Services:  The patient was given information about Chronic Care Management services, agreed to services, and gave verbal consent prior to initiation of services.  Please see initial visit note for detailed documentation.   Patient agreed to services and verbal consent obtained.   Assessment: Review of patient past medical history, allergies, medications, health status, including review of consultants reports, laboratory and other test data, was performed as part of comprehensive evaluation and provision of chronic care management services.   SDOH (Social Determinants of Health) assessments and interventions performed:    CCM Care Plan  Allergies  Allergen Reactions   Rofecoxib Nausea Only    Outpatient Encounter Medications as of 05/23/2021  Medication Sig   furosemide (LASIX) 40 MG tablet Take 40 mg by mouth 2 (two) times daily. 2 tablets in the morning 1 tablet in the afternoon as needed   carvedilol (COREG) 3.125 MG tablet Take 1 tablet by mouth 2 (two) times daily with a meal.   Cholecalciferol (VITAMIN D3) 50 MCG (2000 UT) CAPS Take by mouth.   ferrous sulfate 325 (65 FE) MG tablet Take 325 mg by mouth daily with breakfast.   gabapentin (NEURONTIN) 100 MG capsule    glucose 4 GM chewable tablet Chew 1 tablet (4 g total) by mouth once as needed for low blood sugar.   insulin aspart (NOVOLOG) 100 UNIT/ML injection Basal rate 12am 0.8 units per hour, 9am 0.9 units per hour. (total basal insulin 20.7 units).  Carbohydrate ratio 1 units for every 8gm of carbohydrate. Correction factor 1 units for every 50mg /dl over target cbg. Target CBG 80-120   levothyroxine (SYNTHROID) 25 MCG tablet Take 25 mcg by mouth daily before breakfast.   nystatin cream (MYCOSTATIN) APPLY TO AFFECTED AREA TWICE A DAY   potassium chloride (KLOR-CON) 10 MEQ tablet TAKE 1 TABLET BY MOUTH TWICE DAILY FOR 3 DAYS THEN CONTINUE ONCE DAILY   pravastatin (PRAVACHOL) 40 MG tablet Take 40 mg by mouth daily.    spironolactone (ALDACTONE) 25 MG tablet    vitamin B-12 (CYANOCOBALAMIN) 1000 MCG tablet Take 1,000 mcg by mouth daily.   Facility-Administered Encounter Medications as of 05/23/2021  Medication   heparin lock flush 100 unit/mL   sodium chloride flush (NS) 0.9 % injection 10 mL   sodium chloride flush (NS) 0.9 % injection 10 mL    Patient Active Problem List   Diagnosis Date Noted   Porokeratosis 05/15/2021   Aortic atherosclerosis ([place]) 05/05/2021   Emphysema lung ([place]) 05/05/2021   Thrombocytopenia ([place]) 04/29/2021   Splinter of foot without infection 02/23/2021   Arm laceration 02/09/2021   Hypokalemia 10/24/2020   Injury by nail 08/15/2020   Shingles 07/24/2020   Rash 07/02/2020   Colon cancer screening 07/02/2020   Sick sinus syndrome ([place]) 12/28/2019   Acquired absence of kidney 09/13/2019   Edema of lower extremity 09/13/2019   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, or unspecified 09/13/2019   Proteinuria 09/13/2019   Lower extremity pain,  bilateral    Diabetic foot ulcer associated with type 1 diabetes mellitus ([place])    Acute on chronic systolic CHF (congestive heart failure) ([place])    Cellulitis 08/18/2019   Lower limb ulcer, calf ([place]) 08/01/2019   Left hip pain 07/16/2019   Pain due to onychomycosis of toenails of both feet 04/03/2019   Swelling of limb 02/07/2019   Pancytopenia ([place]) 12/03/2018   CKD (chronic kidney disease) stage 3, GFR 30-59  ml/min (HCC) 09/16/2018   Left shoulder pain 09/16/2018   Chronic cholecystitis 12/29/2017   Graves disease 12/29/2017   Peripheral neuropathy 12/29/2017   S/p nephrectomy 12/29/2017   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   Symptomatic bradycardia 12/02/2017   AKI (acute kidney injury) ([place]) 69/62/9528   Chronic systolic CHF (congestive heart failure) () 12/02/2017   Saturday night paralysis 11/12/2017   Hypoglycemia 10/28/2017   Goals of care, counseling/discussion 10/24/2017   Lymphedema 10/20/2017   Iron deficiency anemia 09/11/2017   Fall 08/13/2017   Humerus fracture 08/13/2017   Anemia 01/12/2017   Bilateral carotid artery stenosis 06/29/2016   Hand laceration 12/16/2015   Adjustment disorder with depressed mood 08/11/2015   Cardiomyopathy, idiopathic ([place]) 04/22/2015   CAD in native artery 11/02/2014   Mantle cell lymphoma ([place]) 08/03/2014   History of colonic polyps 04/29/2014   Irregular heart beat 04/22/2014   SOB (shortness of breath) 04/22/2014   Stress 03/21/2014   PVC (premature ventricular contraction) 02/21/2014   GERD (gastroesophageal reflux disease) 10/23/2013   Gastroparesis 06/25/2013   Chest pain 04/13/2013   Thyroid disease 04/13/2013   Diabetes ([place]) 04/13/2013   Essential hypertension, benign 04/13/2013   Hypercholesterolemia 04/13/2013    Conditions to be addressed/monitored:CHF and DMII  Care Plan : Heart Failure (Adult)  Updates made by Leona Singleton, RN since 05/23/2021 12:00 AM     Problem: Disease Progression (Heart Failure)   Priority: Medium     Long-Range Goal: Patient will report no heart failure exacerbations in the next 90 days   Start Date: 12/06/2020  Expected End Date: 10/10/2021  This Visit's Progress: On track  Recent Progress: On track  Priority: Medium  Note:   Current Barriers:  Knowledge deficit related to basic heart failure pathophysiology and self care management as evidenced by continued lower extremity  edema requiring Unna Boots (changed every Thursday at Vascular office).  Patient reports he has found his scale and is now weighing about every other day.  Weight this morning was 228 pounds (228-230).   Reports lower extremity edema is about the same (better than a year ago) and denies any increase in shortness of breath at this time.   Patient does not have transportation to provider appointments Transportation Barriers; going to use [facility] transportation for appointments within [facility] system and using [employer] for other medical appointments Lacks social connections Unable to perform IADLs independently Case Manager Clinical Goal(s):  patient will verbalize understanding of Heart Failure Action Plan and when to call doctor patient will take all Heart Failure mediations as prescribed patient will weigh daily and record (notifying MD of 3 lb weight gain over night or 5 lb in a week) Interventions:  Collaboration with [name], MD regarding development and update of comprehensive plan of care as evidenced by provider attestation and co-signature Inter-disciplinary care team collaboration (see longitudinal plan of care) Provided verbal education on low sodium diet Advised patient to weigh each morning after emptying bladder Discussed importance of daily weight and advised patient to weigh and  record daily and encouraged patient to do so; Reinforced need to weight daily and encouraged patient to use walker in front of scale to help balance while standing on scale Congratulated patient on finding scale at home, discussed importance of daily weight monitoring, when to weigh, and when to call provider based on weight  Congratulated on finding walker so it can be used to stand on scale, also encouraged to use walker with ambulation Reviewed signs and symptoms of heart failure exacerbation Encouraged elevation of lower extremities as able Encouraged continued use of [facility] [employer] and dial a  ride transportation Discussed medications and encouraged medication compliance Encouraged to use 2022 [place] previously sent to help with logging of weights and other vital signs Discussed importance of weighing at home and not just at providers office Encouraged to use walker with all ambulation, fall precautions and preventions reviewed and discussed Patient Goals/Self-Care Activities Call office if I gain more than 2 pounds in one day or 5 pounds in one week Use salt in moderation, low salt heart healthy diabetic diet Congratulations on finding scale at home Weigh myself daily and write in log for provider review (at least start to weigh a few times a week working way up to daily) Take medications as prescribed Place walker in front of scale to help balance self while standing to weigh (please consider trying this to weigh yourself) Follow Up Plan: The care management team will reach out to the patient again over the next 30 business days.     Care Plan : Diabetes  Updates made by Leona Singleton, RN since 05/23/2021 12:00 AM     Problem: Hypoglycemia causing syncopal episodes   Priority: Medium     Long-Range Goal: Patient will report maintaing Hgb A1C of 7 or below in the next 90 days   Start Date: 01/27/2021  Expected End Date: 10/10/2021  This Visit's Progress: On track  Recent Progress: On track  Priority: Medium  Note:   Objective:  Lab Results  Component Value Date   HGBA1C 7.0 01/23/2021  Current Barriers:  Knowledge Deficits related to basic Diabetes pathophysiology and self care/management as evidenced by multiple syncopal episodes related to hypoglycemia.  Latest Hgb A1C 6.9 on 04/29/21.  Patient reports fasting blood sugar of 150 this morning with decreasing to 55 just prior to [facility] call.  Has treated blood sugar and insulin pump is off at this time. States the vein nurses has accessed his foot along with the podiatrist and no signs of infection from  splinter.  Denies fall in the last 5 weeks. Limited Social Support Trouble getting insulin pump supplies-reports he now has insulin pump sensors Case Manager Clinical Goal(s):  patient will demonstrate improved adherence to prescribed treatment plan for diabetes self care/management as evidenced by: at least 4 times a day monitoring and recording of CBG, adherence to ADA/ carb modified diet, adherence to prescribed medication regimen, contacting provider for new or worsened symptoms or questions Interventions:  Collaboration with [name], MD regarding development and update of comprehensive plan of care as evidenced by provider attestation and co-signature Inter-disciplinary care team collaboration (see longitudinal plan of care) Provided education to patient about basic DM disease process Discussed medications and encouraged medication compliance Discussed plans with patient for ongoing care management follow up and provided patient with direct contact information for care management team Encouraged continued participation with CCM Pharmacist  Reviewed glucose ranges and hypoglycemic events; and discussed dangers of hypoglycemia and treatment options,  need for immediate sugar replacement with juice, sugar, soda in addition to snack or candy bar or glucose tablets Discussed proper treatment of hypoglycemia Reviewed scheduled/upcoming provider appointments including:   Discussed healthy meal and snack options Rediscussed possible [place] or Therapy referral for safety evaluation after 2 recent falls, educating and discussing even if they start services, patient can cancel at any time (patient still declines at this time) Discussed and encouraged patient to be proactive with health instead of reactive after falls Fall precautions and preventions reviewed and discussed, encouraged to use walker with all ambulation,  Instructed and encouraged to wear life alert at all  times Discussed logging blood sugars and writing down insulin doses taken to help provider manage medications; sent 2022 Calendar Booklet to help with keeping log of blood sugars and insulin doses Reviewed and discussed proper diabetic foot care, encouraged patient to keep check on right foot splinter wound for signs and symptoms of infection Discussed and encouraged and reinforced patient to wear bedroom/house/slides while in the home and not to walk around bare foot Discussed with patient asking podiatrist for prescription for diabetic shoes Encouraged to contact Endocrinology if he has to turn insulin pump off again for hypoglycemia Patient Goals/Self-Care Activities Check blood sugar at least 5 times a day Check blood sugar if I feel it is too high or too low Take the blood sugar meter and log to all doctor visits  Consider drinking Boost or other supplement in the evenings if not eating a complete meal Make sure to have candy or glucose tablets on you at all times; try drinking coke along with eating snack to treat hypoglycemia Eat at least 3 meals a day with snack in between to help prevent hypoglycemia Please consider Home Health Nursing/Therapy involvement Fall precautions and preventions; Wear life alert at all times Contact Endocrinology for sustained hypoglycemia Follow Up Plan: The care management team will reach out to the patient again over the next 30 business days.        Plan:The care management team will reach out to the patient again over the next 30 business days.  [name] RN, MSN RN Care Management Coordinator [place] [phone] [email or web address]

## 2021-05-25 ENCOUNTER — Encounter (INDEPENDENT_AMBULATORY_CARE_PROVIDER_SITE_OTHER): Payer: Self-pay | Admitting: Nurse Practitioner

## 2021-05-25 ENCOUNTER — Other Ambulatory Visit: Payer: Self-pay | Admitting: Internal Medicine

## 2021-05-25 DIAGNOSIS — E876 Hypokalemia: Secondary | ICD-10-CM

## 2021-05-25 NOTE — Progress Notes (Signed)
Order placed for f/u potassium check.  

## 2021-05-29 ENCOUNTER — Other Ambulatory Visit: Payer: Self-pay

## 2021-05-29 ENCOUNTER — Encounter (INDEPENDENT_AMBULATORY_CARE_PROVIDER_SITE_OTHER): Payer: Self-pay | Admitting: Nurse Practitioner

## 2021-05-29 ENCOUNTER — Ambulatory Visit (INDEPENDENT_AMBULATORY_CARE_PROVIDER_SITE_OTHER): Payer: Medicare Other | Admitting: Nurse Practitioner

## 2021-05-29 VITALS — BP 101/61 | HR 80 | Resp 16 | Wt 224.8 lb

## 2021-05-29 DIAGNOSIS — I10 Essential (primary) hypertension: Secondary | ICD-10-CM | POA: Diagnosis not present

## 2021-05-29 DIAGNOSIS — I890 Lymphedema, not elsewhere classified: Secondary | ICD-10-CM

## 2021-05-29 DIAGNOSIS — E1151 Type 2 diabetes mellitus with diabetic peripheral angiopathy without gangrene: Secondary | ICD-10-CM | POA: Diagnosis not present

## 2021-06-05 ENCOUNTER — Encounter (INDEPENDENT_AMBULATORY_CARE_PROVIDER_SITE_OTHER): Payer: Medicare Other

## 2021-06-09 ENCOUNTER — Encounter (INDEPENDENT_AMBULATORY_CARE_PROVIDER_SITE_OTHER): Payer: Self-pay | Admitting: Nurse Practitioner

## 2021-06-09 NOTE — Progress Notes (Signed)
Subjective:    [name], male    DOB: 03/15/1946, 75 y.o.   MRN: [id number] Chief Complaint  Patient presents with  . Follow-up    8 wk unna boot check    Gary [name] is a 75 year old male that reports today for evaluation of lower extremity edema and ulceration after several weeks of [place] wraps.  The patient is doing well.  The patient has recently lost weight and this has significantly improved his edema.  He denies any wounds or ulcerations.  He denies any fevers or chills.   Review of Systems  Cardiovascular:  Positive for leg swelling.  All other systems reviewed and are negative.     Objective:   Physical Exam Vitals reviewed.  HENT:     Head: Normocephalic.  Cardiovascular:     Rate and Rhythm: Normal rate.     Pulses: Normal pulses.  Pulmonary:     Effort: Pulmonary effort is normal.  Skin:    General: Skin is warm and dry.  Neurological:     Mental Status: He is alert and oriented to person, place, and time.     Motor: Weakness present.     Gait: Gait abnormal.  Psychiatric:        Mood and Affect: Mood normal.        Behavior: Behavior normal.        Thought Content: Thought content normal.        Judgment: Judgment normal.    BP 101/61 (BP Location: Right Arm)   Pulse 80   Resp 16   Wt 224 lb 12.8 oz (102 kg)   BMI 34.18 kg/m   Past Medical History:  Diagnosis Date  . CHF (congestive heart failure) ([place])   . Depression   . 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 Gastroparesis   . Graves disease   . Hypercholesterolemia   . Hypertension   . Mantle cell lymphoma ([place]) 07/2014  . Peripheral neuropathy   . Shingles     Social History   Socioeconomic History  . Marital status: Widowed    Spouse name: Not on file  . Number of children: 1  . Years of education: 59  . Highest education level: 12th grade  Occupational History  . Occupation: Retired  Tobacco Use  . Smoking  status: Former  . Smokeless tobacco: Current    Types: Chew  Vaping Use  . Vaping Use: Never used  Substance and Sexual Activity  . Alcohol use: Yes    Alcohol/week: 3.0 standard drinks    Types: 3 Cans of beer per week    Comment: nightly  . Drug use: Never  . Sexual activity: Not Currently  Other Topics Concern  . Not on file  Social History Narrative  . Not on file   Social Determinants of Health   Financial Resource Strain: Low Risk   . Difficulty of Paying Living Expenses: Not very hard  Food Insecurity: No Food Insecurity  . Worried About Charity fundraiser in the Last Year: Never true  . Ran Out of Food in the Last Year: Never true  Transportation Needs: No Transportation Needs  . Lack of Transportation (Medical): No  . Lack of Transportation (Non-Medical): No  Physical Activity: Inactive  . Days of Exercise per Week: 0 days  . Minutes of Exercise per Session: 0 min  Stress: Stress Concern Present  . Feeling of Stress :  To some extent  Social Connections: Socially Isolated  . Frequency of Communication with Friends and Family: More than three times a week  . Frequency of Social Gatherings with Friends and Family: Twice a week  . Attends Religious Services: Never  . Active Member of Clubs or Organizations: No  . Attends Archivist Meetings: Never  . Marital Status: Widowed  Intimate Partner Violence: Not At Risk  . Fear of Current or Ex-Partner: No  . Emotionally Abused: No  . Physically Abused: No  . Sexually Abused: No    Past Surgical History:  Procedure Laterality Date  . CHOLECYSTECTOMY    . NEPHRECTOMY     right  . PACEMAKER INSERTION N/A 12/07/2017   Procedure: INSERTION PACEMAKER DUAL CHAMBER INITIAL INSERT;  Surgeon: Isaias Cowman, MD;  Location: [facility] ORS;  Service: Cardiovascular;  Laterality: N/A;  . PORTA CATH INSERTION N/A 11/03/2017   Procedure: PORTA CATH INSERTION;  Surgeon: Katha Cabal, MD;  Location: [facility];  Service: Cardiovascular;  Laterality: N/A;    Family History  Problem Relation Age of Onset  . Breast cancer Mother   . Diabetes Father   . Cancer Father        Lymphoma  . Alcohol abuse Maternal Uncle   . Diabetes Sister   . Heart disease Other        grandfather    Allergies  Allergen Reactions  . Rofecoxib Nausea Only    CBC Latest Ref Rng & Units 04/29/2021 07/29/2020 06/24/2020  WBC 4.0 - 10.5 K/uL 7.2 6.1 6.3  Hemoglobin 13.0 - 17.0 g/dL 13.5 13.9 13.1  Hematocrit 39.0 - 52.0 % 39.1 40.0 39.0  Platelets 150.0 - 400.0 K/uL 149.0(L) 102(L) 131.0(L)      CMP     Component Value Date/Time   NA 139 05/20/2021 1337   NA 138 07/15/2014 0515   K 3.3 (L) 05/20/2021 1337   K 3.5 01/16/2015 1429   CL 101 05/20/2021 1337   CL 110 (H) 07/15/2014 0515   CO2 26 05/20/2021 1337   CO2 24 07/15/2014 0515   GLUCOSE 161 (H) 05/20/2021 1337   GLUCOSE 145 (H) 07/15/2014 0515   BUN 19 05/20/2021 1337   BUN 17 07/15/2014 0515   CREATININE 1.46 05/20/2021 1337   CREATININE 0.89 01/16/2015 1429   CALCIUM 8.1 (L) 05/20/2021 1337   CALCIUM 7.1 (L) 07/15/2014 0515   PROT 6.4 04/29/2021 1605   PROT 6.2 (L) 10/22/2014 1000   ALBUMIN 3.8 04/29/2021 1605   ALBUMIN 2.6 (L) 10/22/2014 1000   AST 8 04/29/2021 1605   AST 19 10/22/2014 1000   ALT 4 04/29/2021 1605   ALT 21 10/22/2014 1000   ALKPHOS 107 04/29/2021 1605   ALKPHOS 118 (H) 10/22/2014 1000   BILITOT 0.5 04/29/2021 1605   BILITOT 0.8 10/22/2014 1000   GFRNONAA >60 01/24/2021 0830   GFRNONAA >60 01/16/2015 1429   GFRAA 41 (L) 01/24/2020 0925   GFRAA >60 01/16/2015 1429     No results found.     Assessment & Plan:   1. Lymphedema The patient has good control of swelling currently.  However in recent history when able to wean patient from the wraps she begins to have extensive blisters with extensive wound formation.  We are finally at a stable point.  Currently during the summer people are more likely to swell  and have issues so we will wait to the winter before we allow trial without his boot or  wraps.  Otherwise the patient will continue to be in [place] wraps and be reevaluated in 6 months.  2. Essential hypertension, benign Continue antihypertensive medications as already ordered, these medications have been reviewed and there are no changes at this time.   3. Type 2 diabetes mellitus with diabetic peripheral angiopathy without gangrene, without long-term current use of insulin (HCC) Continue hypoglycemic medications as already ordered, these medications have been reviewed and there are no changes at this time.  Hgb A1C to be monitored as already arranged by primary service    Current Outpatient Medications on File Prior to Visit  Medication Sig Dispense Refill  . carvedilol (COREG) 3.125 MG tablet Take 1 tablet by mouth 2 (two) times daily with a meal.    . Cholecalciferol (VITAMIN D3) 50 MCG (2000 UT) CAPS Take by mouth.    . ferrous sulfate 325 (65 FE) MG tablet Take 325 mg by mouth daily with breakfast.    . furosemide (LASIX) 40 MG tablet Take 40 mg by mouth 2 (two) times daily. 2 tablets in the morning 1 tablet in the afternoon as needed    . gabapentin (NEURONTIN) 100 MG capsule     . glucose 4 GM chewable tablet Chew 1 tablet (4 g total) by mouth once as needed for low blood sugar. 50 tablet 0  . insulin aspart (NOVOLOG) 100 UNIT/ML injection Basal rate 12am 0.8 units per hour, 9am 0.9 units per hour. (total basal insulin 20.7 units). Carbohydrate ratio 1 units for every 8gm of carbohydrate. Correction factor 1 units for every 50mg /dl over target cbg. Target CBG 80-120 1 vial 12  . levothyroxine (SYNTHROID) 25 MCG tablet Take 25 mcg by mouth daily before breakfast.    . nystatin cream (MYCOSTATIN) APPLY TO AFFECTED AREA TWICE A DAY 30 g 1  . potassium chloride (KLOR-CON) 10 MEQ tablet TAKE 1 TABLET BY MOUTH TWICE DAILY FOR 3 DAYS THEN CONTINUE ONCE DAILY 30 tablet 0  . pravastatin  (PRAVACHOL) 40 MG tablet Take 40 mg by mouth daily.     Marland Kitchen spironolactone (ALDACTONE) 25 MG tablet     . vitamin B-12 (CYANOCOBALAMIN) 1000 MCG tablet Take 1,000 mcg by mouth daily.     Current Facility-Administered Medications on File Prior to Visit  Medication Dose Route Frequency Provider Last Rate Last Admin  . heparin lock flush 100 unit/mL  500 Units Intravenous Once Lloyd Huger, MD      . sodium chloride flush (NS) 0.9 % injection 10 mL  10 mL Intravenous PRN Lloyd Huger, MD   10 mL at 02/01/18 0829  . sodium chloride flush (NS) 0.9 % injection 10 mL  10 mL Intravenous PRN Lloyd Huger, MD   10 mL at 04/05/18 0800    There are no Patient Instructions on file for this visit. No follow-ups on file.   Kris Hartmann, NP

## 2021-06-10 ENCOUNTER — Ambulatory Visit (INDEPENDENT_AMBULATORY_CARE_PROVIDER_SITE_OTHER): Payer: Medicare Other | Admitting: Pharmacist

## 2021-06-10 ENCOUNTER — Other Ambulatory Visit: Payer: Self-pay

## 2021-06-10 DIAGNOSIS — E876 Hypokalemia: Secondary | ICD-10-CM

## 2021-06-10 DIAGNOSIS — E1151 Type 2 diabetes mellitus with diabetic peripheral angiopathy without gangrene: Secondary | ICD-10-CM

## 2021-06-10 DIAGNOSIS — E7800 Pure hypercholesterolemia, unspecified: Secondary | ICD-10-CM

## 2021-06-10 DIAGNOSIS — I10 Essential (primary) hypertension: Secondary | ICD-10-CM

## 2021-06-10 DIAGNOSIS — E079 Disorder of thyroid, unspecified: Secondary | ICD-10-CM

## 2021-06-10 LAB — POTASSIUM: Potassium: 3.7 mEq/L (ref 3.5–5.1)

## 2021-06-10 NOTE — Chronic Care Management (AMB) (Signed)
Chronic Care Management Pharmacy Note  06/10/2021 Name:  Gary [name] MRN:  [id number] DOB:  08-09-1946  Subjective: Gary [name] is an 75 y.o. year old male who is a primary patient of Gary Pheasant, MD.  The CCM team was consulted for assistance with disease management and care coordination needs.    Engaged with patient face to face for follow up visit in response to provider referral for pharmacy case management and/or care coordination services.   Consent to Services:  The patient was given information about Chronic Care Management services, agreed to services, and gave verbal consent prior to initiation of services.  Please see initial visit note for detailed documentation.   Patient Care Team: Gary Pheasant, MD as PCP - General (Internal Medicine) Lloyd Huger, MD as Consulting Physician (Oncology) Algernon Huxley, MD as Referring Physician (Vascular Surgery) Schnier, Dolores Lory, MD (Vascular Surgery) Corey Skains, MD as Consulting Physician (Cardiology) De Hollingshead, RPH-[name] (Pharmacist) Leona Singleton, RN as Case Manager Saporito, Maree Erie, LCSW as Social Worker (Licensed Clinical Social Worker)  Objective:  Lab Results  Component Value Date   CREATININE 1.46 05/20/2021   CREATININE 1.54 (H) 04/29/2021   CREATININE 1.27 02/18/2021    Lab Results  Component Value Date   HGBA1C 7.0 01/23/2021   Last diabetic Eye exam:  Lab Results  Component Value Date/Time   HMDIABEYEEXA No Retinopathy 03/24/2021 12:00 AM    Last diabetic Foot exam:  Lab Results  Component Value Date/Time   HMDIABFOOTEX see my exam.  07/10/2019 12:00 AM        Component Value Date/Time   CHOL 126 04/29/2021 1605   CHOL 82 06/14/2014 0404   TRIG 50.0 04/29/2021 1605   TRIG 34 06/14/2014 0404   HDL 44.20 04/29/2021 1605   HDL 38 (L) 06/14/2014 0404   CHOLHDL 3 04/29/2021 1605   VLDL 10.0 04/29/2021 1605   VLDL 7 06/14/2014 0404   LDLCALC 72 04/29/2021 1605    LDLCALC 37 06/14/2014 0404    Hepatic Function Latest Ref Rng & Units 04/29/2021 07/29/2020 06/24/2020  Total Protein 6.0 - 8.3 g/dL 6.4 7.1 6.6  Albumin 3.5 - 5.2 g/dL 3.8 3.4(L) 3.7  AST 0 - 37 U/L _0 ALT 0 - 53 U/L _1 Alk Phosphatase 39 - 117 U/L 107 115 141(H)  Total Bilirubin 0.2 - 1.2 mg/dL 0.5 1.1 0.4  Bilirubin, Direct 0.0 - 0.3 mg/dL 0.1 - 0.1    Lab Results  Component Value Date/Time   TSH 1.81 04/29/2021 04:05 PM   TSH 0.94 10/24/2020 02:55 PM    CBC Latest Ref Rng & Units 04/29/2021 07/29/2020 06/24/2020  WBC 4.0 - 10.5 K/uL 7.2 6.1 6.3  Hemoglobin 13.0 - 17.0 g/dL 13.5 13.9 13.1  Hematocrit 39.0 - 52.0 % 39.1 40.0 39.0  Platelets 150.0 - 400.0 K/uL 149.0(L) 102(L) 131.0(L)    No results found for: VD25OH  Clinical ASCVD: No  The ASCVD Risk score Gary Bussing DC Jr., et al., 2013) failed to calculate for the following reasons:   The valid total cholesterol range is 130 to 320 mg/dL      Social History   Tobacco Use  Smoking Status Former  Smokeless Tobacco Current   Types: Chew   BP Readings from Last 3 Encounters:  05/29/21 101/61  05/22/21 (!) 150/75  05/15/21 118/77   Pulse Readings from Last 3 Encounters:  05/29/21 80  05/22/21 74  05/15/21 76  Wt Readings from Last 3 Encounters:  05/29/21 224 lb 12.8 oz (102 kg)  05/22/21 223 lb 12.8 oz (101.5 kg)  05/15/21 228 lb (103.4 kg)    Assessment: Review of patient past medical history, allergies, medications, health status, including review of consultants reports, laboratory and other test data, was performed as part of comprehensive evaluation and provision of chronic care management services.   SDOH:  (Social Determinants of Health) assessments and interventions performed:  SDOH Interventions    Flowsheet Row Most Recent Value  SDOH Interventions   Financial Strain Interventions Intervention Not Indicated       CCM Care Plan  Allergies  Allergen Reactions   Rofecoxib Nausea Only     Medications Reviewed Today     Reviewed by Kris Hartmann, NP (Nurse Practitioner) on 06/09/21 at [facility]  Med List Status: <None>   Medication Order Taking? Sig Documenting Provider Last Dose Status Informant  carvedilol (COREG) 3.125 MG tablet [id number] Yes Take 1 tablet by mouth 2 (two) times daily with a meal. [provider] Taking Active   Cholecalciferol (VITAMIN D3) 50 MCG (2000 UT) CAPS [id number] Yes Take by mouth. [provider] Taking Active   ferrous sulfate 325 (65 FE) MG tablet [id number] Yes Take 325 mg by mouth daily with breakfast. [provider] Taking Active   furosemide (LASIX) 40 MG tablet [id number] Yes Take 40 mg by mouth 2 (two) times daily. 2 tablets in the morning 1 tablet in the afternoon as needed [provider] Taking Active Other  gabapentin (NEURONTIN) 100 MG capsule [id number] Yes  [provider] Taking Active   glucose 4 GM chewable tablet [id number] Yes Chew 1 tablet (4 g total) by mouth once as needed for low blood sugar. Gary Pheasant, MD Taking Active   heparin lock flush 100 unit/mL [id number]   Lloyd Huger, MD  Active   insulin aspart (NOVOLOG) 100 UNIT/ML injection [id number] Yes Basal rate 12am 0.8 units per hour, 9am 0.9 units per hour. (total basal insulin 20.7 units). Carbohydrate ratio 1 units for every 8gm of carbohydrate. Correction factor 1 units for every 27m/dl over target cbg. Target CBG 80-120 WLoletha Grayer MD Taking Active Other  levothyroxine (SYNTHROID) 25 MCG tablet [id number] Take 25 mcg by mouth daily before breakfast. [provider] Taking Active   nystatin cream (MYCOSTATIN) [id number] APPLY TO AFFECTED AREA TWICE A DMaxcine Ham CRandell Patient MD Taking Active   potassium chloride (KLOR-CON) 10 MEQ tablet [id number] TAKE 1 TABLET BY MOUTH TWICE DAILY FOR 3 DAYS THEN CONTINUE ONCE DAILY Gary Pheasant MD Taking Active   pravastatin (PRAVACHOL) 40 MG tablet [id number] Yes Take 40 mg by mouth daily.  [provider] Taking Active Other  sodium chloride flush (NS) 0.9 % injection 10 mL [id number]  FLloyd Huger MD  Active   sodium chloride flush (NS) 0.9 % injection 10 mL [id number]  FLloyd Huger MD  Active   spironolactone (ALDACTONE) 25 MG tablet [id number]  [provider] Taking Active   vitamin B-12 (CYANOCOBALAMIN) 1000 MCG tablet [id number] Take 1,000 mcg by mouth daily. 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, or unspecified 09/13/2019   Proteinuria 09/13/2019   Lower extremity pain, bilateral    Diabetic foot ulcer associated with type 1 diabetes mellitus (HCC)    Acute on chronic systolic CHF (congestive heart failure) ([place])    Cellulitis 08/18/2019   Lower limb ulcer, calf ([place]) 08/01/2019   Left hip pain 07/16/2019   Pain due to onychomycosis of toenails of both feet 04/03/2019   Swelling of limb 02/07/2019   Pancytopenia ([place]) 12/03/2018   CKD (chronic kidney disease) stage 3, GFR 30-59 ml/min (HCC) 09/16/2018   Left shoulder pain 09/16/2018   Chronic cholecystitis 12/29/2017   Graves disease 12/29/2017   Peripheral neuropathy 12/29/2017   S/p nephrectomy 12/29/2017   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   Symptomatic bradycardia 12/02/2017   AKI (acute kidney injury) ([place])  44/31/5400   Chronic systolic CHF (congestive heart failure) ([place]) 12/02/2017   Saturday night paralysis 11/12/2017   Hypoglycemia 10/28/2017   Goals of care, counseling/discussion 10/24/2017   Lymphedema 10/20/2017   Iron deficiency anemia 09/11/2017   Fall 08/13/2017   Humerus fracture 08/13/2017   Anemia 01/12/2017   Bilateral carotid artery stenosis 06/29/2016   Hand laceration 12/16/2015   Adjustment disorder with depressed mood 08/11/2015   Cardiomyopathy, idiopathic ([place]) 04/22/2015   CAD in native artery 11/02/2014   Mantle cell lymphoma ([place]) 08/03/2014   History of colonic polyps 04/29/2014   Irregular heart beat 04/22/2014   SOB (shortness of breath) 04/22/2014   Stress 03/21/2014   PVC (premature ventricular contraction) 02/21/2014   GERD (gastroesophageal reflux disease) 10/23/2013   Gastroparesis 06/25/2013   Chest pain 04/13/2013   Thyroid disease 04/13/2013   Diabetes ([place]) 04/13/2013   Essential hypertension, benign 04/13/2013   Hypercholesterolemia 04/13/2013    Immunization History  Administered Date(s) Administered   Fluad Quad(high Dose 65+) 07/10/2019, 06/24/2020   Influenza Split 07/13/2014   Influenza, High Dose Seasonal PF 10/22/2016, 10/21/2017, 09/14/2018   Influenza, Seasonal, Injecte, Preservative Fre 08/27/2008   Influenza,inj,Quad PF,6+ Mos 06/20/2013, 08/09/2015   Influenza-Unspecified 06/20/2013, 08/09/2015   Pneumococcal Conjugate-13 12/26/2014   Pneumococcal Polysaccharide-23 10/22/2017   Tdap 07/14/2018    Conditions to be addressed/monitored: CHF, HTN, and T1DM  Care Plan : Medication Management  Updates made by De Hollingshead, RPH-[name] since 06/10/2021 12:00 AM     Problem: T1DM, CKD, HF      Long-Range Goal: Disease Progression Prevention   Start Date: 12/13/2020  This Visit's Progress: On track  Recent Progress: On track  Priority: High  Note:   Current Barriers:  Unable to achieve control of diabetes  Complex  patient with multiple comorbidities at risk for exacerbation  Pharmacist Clinical Goal(s):  Over the next 90 days, patient will achieve adherence to monitoring guidelines and medication adherence to achieve therapeutic efficacy through collaboration with PharmD and provider.   Interventions: 1:1 collaboration with Gary Pheasant, MD regarding development and update of comprehensive plan of care as evidenced by provider attestation and co-signature Inter-disciplinary care team collaboration (see longitudinal plan of care) Comprehensive medication review performed; medication list updated in electronic medical record  SDOH: Receiving meals on wheels, transportation benefits. Reports that his son is "back in  the picture" and he worries about that.   Health Maintenance Yearly diabetic eye exam: up to date Yearly diabetic foot exam: up to date Urine microalbumin: due Yearly influenza vaccination: due Td/Tdap vaccination: up to date Pneumonia vaccination: up to date COVID vaccinations: due - patient declined Shingrix vaccinations: due - patient declined Colonoscopy: due  Medication Management: Discussed using a pill box, but patient prefers not to, as he likes to be able to confirm what he is taking every day. Lines up his pill bottles daily. Did not bring medication bottles today, but was able to review verbally.  Provided a list as below:  AM: levothyroxine 25 mcg, furosemide 80 mg (2 40 mg tablets), carvedilol 3.125 mg Afternoon: furosemide 40 mg  PM: carvedilol 3.125 mg, pravastatin 40 mg, Vitamin D, Vitamin B12, ferrous sulfate Will mail updated list pending results of BMP today  Diabetes: A1c controlled but extremely fluctuant; current treatment: Novolog per Medtronic 630G pump; follows w/ Dr. Honor Junes. Plan to change pump when warranty expires 11/23 per Dr. Sherren Mocha documentation Current glucose readings: using DexCom CGM. Denies significant episodes of hypoglycemia  recently.  Recommended to continue current regimen at this time along with collaboration with endocrinology.   HFrEF, CKD: Controlled; current treatment: carvedilol 3.125 mg BID, furosemide 80 mg QAM, 40 mg QPM, potassium 10 mEq daily Lisinopril previously removed d/t soft BP (per cardiology) Patient stopped spironolactone due to significant dehydration/symptoms of hypotension.  Current home readings: not checking at home. Reports that since he has blood pressure checked weekly at [facility], he does not feel a need to check at home.  Reports that he did find his scale and weighs every few days. Denies weight gain BP at goal today. Following up on potassium w/ BMP today. If there was room in BP, would consider re-trial of spironolactone at 12.5 mg w/ reduction in furosemide to hopefully maintain potassium level. Follow results to determine need to adjust potassium.  Reviewed that he can take his second dose of furosemide ~ 6 hours after first, in the early afternoon. He denies issues with nocturia anyway.  Advised to continue current regimen at this time and continue collaboration with cardiology, nephrology, primary care.  Hyperlipidemia: Controlled per last lipid panel; current treatment: pravastatin 40 mg daily Recommended to continue current regimen at this time  Hypothyroidism: Controlled per last lab work; current regimen: levothyroxine 25 mcg daily Recommended to continue current regimen at this time  Supplements: Vitamin D, Vitamin B12, ferrous sulfate  Patient Goals/Self-Care Activities Over the next 90 days, patient will:  - take medications as prescribed focus on medication adherence by using provided medication list Continue to collaborate with interdisciplinary team  Follow Up Plan: Telephone follow up appointment with care management team member scheduled for: 3 months     Medication Assistance: None required.  Patient affirms current coverage meets needs.  Patient's  preferred pharmacy is:  [id number] [id number]: [phone]: [phone] Follow Up:  Patient agrees to Care Plan and Follow-up.  Plan: Telephone follow up appointment with care management team member scheduled for:  12 weeks  Catie TDarnelle Maffucci PharmD, BNageezi CWhittemoreClinical Pharmacist LOccidental Petroleumat BJohnson & [phone]

## 2021-06-10 NOTE — Patient Instructions (Addendum)
[name],   It was great to see you today! We'll call you about the results of your lab work today to tell you if you need to change either the fluid pill (furosemide) or the potassium supplement.   Here is how to take your medications:  Morning: - Levothyroxine 25 mcg - thyroid  - Carvedilol 3.125 mg daily - heart, blood pressure - Furosemide 80 mg - 2  Tablets - fluid pill  Afternoon (about 2 pm): - Furosemide 40 mg - 1 tablet  - fluid pill    Evening; - Carvedilol 3.125 mg daily - heart, blood pressure - Pravastatin 40 mg daily  - cholesterol - Potassium 10 mEq  - Vitamin D - Vitamin B12  - Iron supplement  Gary [name], PharmD [phone]    Visit Information  PATIENT GOALS:  Goals Addressed               This Visit's Progress     Patient Stated     Medication Monitoring (pt-stated)        Patient Goals/Self-Care Activities Over the next 90 days, patient will:  - take medications as prescribed focus on medication adherence by using weekly pill box Continue to collaborate with interdisciplinary team         Print copy of patient instructions, educational materials, and care plan provided in person.  Plan: Telephone follow up appointment with care management team member scheduled for:  12 weeks  Gary [name], PharmD, [place], [place] Clinical Pharmacist [employer] at [employer] [phone]

## 2021-06-11 ENCOUNTER — Ambulatory Visit: Payer: Medicare Other | Admitting: *Deleted

## 2021-06-11 DIAGNOSIS — I10 Essential (primary) hypertension: Secondary | ICD-10-CM | POA: Diagnosis not present

## 2021-06-11 DIAGNOSIS — E7800 Pure hypercholesterolemia, unspecified: Secondary | ICD-10-CM

## 2021-06-11 DIAGNOSIS — E1151 Type 2 diabetes mellitus with diabetic peripheral angiopathy without gangrene: Secondary | ICD-10-CM | POA: Diagnosis not present

## 2021-06-11 DIAGNOSIS — I5022 Chronic systolic (congestive) heart failure: Secondary | ICD-10-CM

## 2021-06-11 NOTE — Patient Instructions (Signed)
Visit Information  PATIENT GOALS:  Goals Addressed             This Visit's Progress    (RNCM) Monitor and Manage My Blood Sugar-Diabetes Type 1   On track    Timeframe:  Long-Range Goal Priority:  High Start Date:   12/06/20                          Expected End Date:  12/09/21                    Follow Up Date 07/04/2021    Check blood sugar at least 5 times a day Check blood sugar if I feel it is too high or too low Take the blood sugar meter and log to all doctor visits  Consider drinking Boost or other supplement in the evenings if not eating a complete meal Make sure to have candy or glucose tablets on you at all times; try drinking coke along with eating snack to treat hypoglycemia Eat at least 3 meals a day with snack in between to help prevent hypoglycemia Please consider Home Health Nursing/Therapy involvement Fall precautions and preventions; Wear life alert at all times Contact Endocrinology for sustained hypoglycemia   Why is this important?   Checking your blood sugar at home helps to keep it from getting very high or very low.  Writing the results in a diary or log helps the doctor know how to care for you.  Your blood sugar log should have the time, the date and the results.  Also, write down the amount of insulin or other medicine you take.  Other information like what you ate, exercise done and how you were feeling will also be helpful..     Notes:      (RNCM) Track and Manage Fluids and Swelling-Heart Failure   On track    Timeframe:  Long-Range Goal Priority:  Medium Start Date:   12/06/20                          Expected End Date:  12/09/21                    Follow Up Date 07/04/21   Call office if I gain more than 2 pounds in one day or 5 pounds in one week Use salt in moderation, low salt heart healthy diabetic diet Weigh myself daily and write in log for provider review (at least start to weigh a few times a week working way up to daily) Take  medications as prescribed Place walker in front of scale to help balance self while standing to weigh (please consider trying this to weigh yourself)   Why is this important?   It is important to check your weight daily and watch how much salt and liquids you have.  It will help you to manage your heart failure.    Notes:         Patient verbalizes understanding of instructions provided today and agrees to view in [place].   The care management team will reach out to the patient again over the next 30 business days.   [name] RN, MSN RN Care Management Coordinator [place] [phone] [email or web address]

## 2021-06-11 NOTE — Chronic Care Management (AMB) (Signed)
Chronic Care Management   CCM RN Visit Note  06/11/2021 Name: Gary [name] MRN: [id number] DOB: 06-Dec-1945  Subjective: Gary [name] is a 75 y.o. year old male who is a primary care patient of [name], MD. The care management team was consulted for assistance with disease management and care coordination needs.    Engaged with patient by telephone for follow up visit in response to provider referral for case management and/or care coordination services.   Consent to Services:  The patient was given information about Chronic Care Management services, agreed to services, and gave verbal consent prior to initiation of services.  Please see initial visit note for detailed documentation.   Patient agreed to services and verbal consent obtained.   Assessment: Review of patient past medical history, allergies, medications, health status, including review of consultants reports, laboratory and other test data, was performed as part of comprehensive evaluation and provision of chronic care management services.   SDOH (Social Determinants of Health) assessments and interventions performed:    CCM Care Plan  Allergies  Allergen Reactions   Rofecoxib Nausea Only    Outpatient Encounter Medications as of 06/11/2021  Medication Sig   carvedilol (COREG) 3.125 MG tablet Take 1 tablet by mouth 2 (two) times daily with a meal.   Cholecalciferol (VITAMIN D3) 50 MCG (2000 UT) CAPS Take by mouth.   ferrous sulfate 325 (65 FE) MG tablet Take 325 mg by mouth daily with breakfast.   furosemide (LASIX) 40 MG tablet Take 40 mg by mouth 2 (two) times daily. 2 tablets in the morning 1 tablet in the afternoon as needed   glucose 4 GM chewable tablet Chew 1 tablet (4 g total) by mouth once as needed for low blood sugar. (Patient not taking: Reported on 06/10/2021)   insulin aspart (NOVOLOG) 100 UNIT/ML injection Basal rate 12am 0.8 units per hour, 9am 0.9 units per hour. (total basal insulin 20.7 units).  Carbohydrate ratio 1 units for every 8gm of carbohydrate. Correction factor 1 units for every 50mg /dl over target cbg. Target CBG 80-120   levothyroxine (SYNTHROID) 25 MCG tablet Take 25 mcg by mouth daily before breakfast.   nystatin cream (MYCOSTATIN) APPLY TO AFFECTED AREA TWICE A DAY   potassium chloride (KLOR-CON) 10 MEQ tablet TAKE 1 TABLET BY MOUTH TWICE DAILY FOR 3 DAYS THEN CONTINUE ONCE DAILY   pravastatin (PRAVACHOL) 40 MG tablet Take 40 mg by mouth daily.    vitamin B-12 (CYANOCOBALAMIN) 1000 MCG tablet Take 1,000 mcg by mouth daily.   Facility-Administered Encounter Medications as of 06/11/2021  Medication   heparin lock flush 100 unit/mL   sodium chloride flush (NS) 0.9 % injection 10 mL   sodium chloride flush (NS) 0.9 % injection 10 mL    Patient Active Problem List   Diagnosis Date Noted   Porokeratosis 05/15/2021   Aortic atherosclerosis ([place]) 05/05/2021   Emphysema lung ([place]) 05/05/2021   Thrombocytopenia ([place]) 04/29/2021   Splinter of foot without infection 02/23/2021   Arm laceration 02/09/2021   Hypokalemia 10/24/2020   Injury by nail 08/15/2020   Shingles 07/24/2020   Rash 07/02/2020   Colon cancer screening 07/02/2020   Sick sinus syndrome ([place]) 12/28/2019   Acquired absence of kidney 09/13/2019   Edema of lower extremity 09/13/2019   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, or unspecified 09/13/2019   Proteinuria 09/13/2019   Lower extremity pain, bilateral    Diabetic foot ulcer associated with type  1 diabetes mellitus ([place])    Acute on chronic systolic CHF (congestive heart failure) ([place])    Cellulitis 08/18/2019   Lower limb ulcer, calf ([place]) 08/01/2019   Left hip pain 07/16/2019   Pain due to onychomycosis of toenails of both feet 04/03/2019   Swelling of limb 02/07/2019   Pancytopenia ([place]) 12/03/2018   CKD (chronic kidney disease) stage 3, GFR 30-59 ml/min (HCC) 09/16/2018   Left shoulder pain  09/16/2018   Chronic cholecystitis 12/29/2017   Graves disease 12/29/2017   Peripheral neuropathy 12/29/2017   S/p nephrectomy 12/29/2017   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   Symptomatic bradycardia 12/02/2017   AKI (acute kidney injury) ([place]) 88/41/6606   Chronic systolic CHF (congestive heart failure) ([place]) 12/02/2017   Saturday night paralysis 11/12/2017   Hypoglycemia 10/28/2017   Goals of care, counseling/discussion 10/24/2017   Lymphedema 10/20/2017   Iron deficiency anemia 09/11/2017   Fall 08/13/2017   Humerus fracture 08/13/2017   Anemia 01/12/2017   Bilateral carotid artery stenosis 06/29/2016   Hand laceration 12/16/2015   Adjustment disorder with depressed mood 08/11/2015   Cardiomyopathy, idiopathic ([place]) 04/22/2015   CAD in native artery 11/02/2014   Mantle cell lymphoma ([place]) 08/03/2014   History of colonic polyps 04/29/2014   Irregular heart beat 04/22/2014   SOB (shortness of breath) 04/22/2014   Stress 03/21/2014   PVC (premature ventricular contraction) 02/21/2014   GERD (gastroesophageal reflux disease) 10/23/2013   Gastroparesis 06/25/2013   Chest pain 04/13/2013   Thyroid disease 04/13/2013   Diabetes ([place]) 04/13/2013   Essential hypertension, benign 04/13/2013   Hypercholesterolemia 04/13/2013    Conditions to be addressed/monitored:CHF and DMII  Care Plan : Heart Failure (Adult)  Updates made by Leona Singleton, RN since 06/11/2021 12:00 AM     Problem: Disease Progression (Heart Failure)   Priority: Medium     Long-Range Goal: Patient will report no heart failure exacerbations in the next 90 days   Start Date: 12/06/2020  Expected End Date: 10/10/2021  This Visit's Progress: On track  Recent Progress: On track  Priority: Medium  Note:   Current Barriers:  Knowledge deficit related to basic heart failure pathophysiology and self care management as evidenced by continued lower extremity edema requiring Unna Boots (changed every Thursday  at Vascular office).  Patient reports he has found his scale and is now weighing about every other day.  Has not weighed this morning a of yet but weights have ranged (221-230).   Reports lower extremity edema is about the same (better than a year ago) and denies any increase in shortness of breath at this time.   Patient does not have transportation to provider appointments Transportation Barriers; going to use [facility] transportation for appointments within [facility] system and using [employer] for other medical appointments Lacks social connections Unable to perform IADLs independently Case Manager Clinical Goal(s):  patient will verbalize understanding of Heart Failure Action Plan and when to call doctor patient will take all Heart Failure mediations as prescribed patient will weigh daily and record (notifying MD of 3 lb weight gain over night or 5 lb in a week) Interventions:  Collaboration with [name], MD regarding development and update of comprehensive plan of care as evidenced by provider attestation and co-signature Inter-disciplinary care team collaboration (see longitudinal plan of care) Provided verbal education on low sodium diet Advised patient to weigh each morning after emptying bladder Discussed importance of daily weight and advised patient to weigh and record daily and encouraged  patient to do so; Reinforced need to weight daily and encouraged patient to use walker in front of scale to help balance while standing on scale Congratulated patient on finding scale at home, discussed importance of daily weight monitoring, when to weigh, and when to call provider based on weight  Congratulated on finding walker so it can be used to stand on scale, also encouraged to use walker with ambulation Reviewed signs and symptoms of heart failure exacerbation Encouraged elevation of lower extremities as able Encouraged continued use of [facility] [employer] and [employer] ride  transportation Discussed medications and encouraged medication compliance Encouraged to use 2022 [place] previously sent to help with logging of weights and other vital signs Discussed importance of weighing at home and not just at providers office Patient Goals/Self-Care Activities Call office if I gain more than 2 pounds in one day or 5 pounds in one week Use salt in moderation, low salt heart healthy diabetic diet Weigh myself daily and write in log for provider review (at least start to weigh a few times a week working way up to daily) Take medications as prescribed Place walker in front of scale to help balance self while standing to weigh (please consider trying this to weigh yourself) Follow Up Plan: The care management team will reach out to the patient again over the next 30 business days.     Care Plan : Diabetes  Updates made by Leona Singleton, RN since 06/11/2021 12:00 AM     Problem: Hypoglycemia causing syncopal episodes   Priority: Medium     Long-Range Goal: Patient will report maintaing Hgb A1C of 7 or below in the next 90 days   Start Date: 01/27/2021  Expected End Date: 10/10/2021  Recent Progress: On track  Priority: Medium  Note:   Objective:  Lab Results  Component Value Date   HGBA1C 7.0 01/23/2021  Current Barriers:  Knowledge Deficits related to basic Diabetes pathophysiology and self care/management as evidenced by multiple syncopal episodes related to hypoglycemia.  Latest Hgb A1C 6.9 on 04/29/21.  Patient reports fasting blood sugar of 150 this morning with recent fasting ranges of 150-210>  States he has not had to turn off insulin pump related to hypoglycemia in the last 2 weeks.  Denies fall in the last 7 weeks. Limited Social Support Trouble getting insulin pump supplies-reports he now has insulin pump sensors Case Manager Clinical Goal(s):  patient will demonstrate improved adherence to prescribed treatment plan for diabetes self  care/management as evidenced by: at least 4 times a day monitoring and recording of CBG, adherence to ADA/ carb modified diet, adherence to prescribed medication regimen, contacting provider for new or worsened symptoms or questions Interventions:  Collaboration with [name], MD regarding development and update of comprehensive plan of care as evidenced by provider attestation and co-signature Inter-disciplinary care team collaboration (see longitudinal plan of care) Provided education to patient about basic DM disease process Discussed medications and encouraged medication compliance Discussed plans with patient for ongoing care management follow up and provided patient with direct contact information for care management team Encouraged continued participation with CCM Pharmacist  Reviewed glucose ranges and hypoglycemic events; and discussed dangers of hypoglycemia and treatment options, need for immediate sugar replacement with juice, sugar, soda in addition to snack or candy bar or glucose tablets Discussed proper treatment of hypoglycemia Reviewed scheduled/upcoming provider appointments including:   Discussed healthy meal and snack options Rediscussed possible [place] or Therapy referral for  safety evaluation after 2 recent falls, educating and discussing even if they start services, patient can cancel at any time (patient still declines at this time) Discussed and encouraged patient to be proactive with health instead of reactive after falls Fall precautions and preventions reviewed and discussed, encouraged to use walker with all ambulation,  Instructed and encouraged to wear life alert at all times Discussed logging blood sugars and writing down insulin doses taken to help provider manage medications; sent 2022 Calendar Booklet to help with keeping log of blood sugars and insulin doses Reviewed and discussed proper diabetic foot care, encouraged patient to keep check on  right foot splinter wound for signs and symptoms of infection Discussed and encouraged and reinforced patient to wear bedroom/house/slides while in the home and not to walk around bare foot Discussed with patient asking podiatrist for prescription for diabetic shoes Encouraged to contact Endocrinology if he has to turn insulin pump off again for hypoglycemia Patient Goals/Self-Care Activities Check blood sugar at least 5 times a day Check blood sugar if I feel it is too high or too low Take the blood sugar meter and log to all doctor visits  Consider drinking Boost or other supplement in the evenings if not eating a complete meal Make sure to have candy or glucose tablets on you at all times; try drinking coke along with eating snack to treat hypoglycemia Eat at least 3 meals a day with snack in between to help prevent hypoglycemia Please consider Home Health Nursing/Therapy involvement Fall precautions and preventions; Wear life alert at all times Contact Endocrinology for sustained hypoglycemia  Follow Up Plan: The care management team will reach out to the patient again over the next 30 business days.        Plan:The care management team will reach out to the patient again over the next 30 business days.  [name] RN, MSN RN Care Management Coordinator [place] [phone] [email or web address]

## 2021-06-12 ENCOUNTER — Ambulatory Visit (INDEPENDENT_AMBULATORY_CARE_PROVIDER_SITE_OTHER): Payer: Medicare Other | Admitting: Nurse Practitioner

## 2021-06-12 ENCOUNTER — Other Ambulatory Visit: Payer: Self-pay

## 2021-06-12 ENCOUNTER — Encounter (INDEPENDENT_AMBULATORY_CARE_PROVIDER_SITE_OTHER): Payer: Self-pay

## 2021-06-12 VITALS — BP 125/67 | HR 69 | Resp 16 | Wt 222.0 lb

## 2021-06-12 DIAGNOSIS — I890 Lymphedema, not elsewhere classified: Secondary | ICD-10-CM

## 2021-06-12 NOTE — Progress Notes (Signed)
History of Present Illness  There is no documented history at this time  Assessments & Plan   There are no diagnoses linked to this encounter.    Additional instructions  Subjective:  Patient presents with venous ulcer of the Bilateral lower extremity.    Procedure:  3 layer unna wrap was placed Bilateral lower extremity.   Plan:   Follow up in one week.  

## 2021-06-16 ENCOUNTER — Encounter (INDEPENDENT_AMBULATORY_CARE_PROVIDER_SITE_OTHER): Payer: Self-pay | Admitting: Nurse Practitioner

## 2021-06-17 ENCOUNTER — Other Ambulatory Visit: Payer: Self-pay | Admitting: Internal Medicine

## 2021-06-19 ENCOUNTER — Inpatient Hospital Stay: Payer: Medicare Other | Attending: Oncology

## 2021-06-19 ENCOUNTER — Ambulatory Visit (INDEPENDENT_AMBULATORY_CARE_PROVIDER_SITE_OTHER): Payer: Medicare Other | Admitting: Nurse Practitioner

## 2021-06-19 ENCOUNTER — Other Ambulatory Visit: Payer: Self-pay

## 2021-06-19 ENCOUNTER — Encounter (INDEPENDENT_AMBULATORY_CARE_PROVIDER_SITE_OTHER): Payer: Self-pay | Admitting: Nurse Practitioner

## 2021-06-19 VITALS — BP 131/70 | HR 62 | Resp 16 | Wt 223.4 lb

## 2021-06-19 DIAGNOSIS — Z452 Encounter for adjustment and management of vascular access device: Secondary | ICD-10-CM | POA: Diagnosis not present

## 2021-06-19 DIAGNOSIS — I890 Lymphedema, not elsewhere classified: Secondary | ICD-10-CM | POA: Diagnosis not present

## 2021-06-19 DIAGNOSIS — C8318 Mantle cell lymphoma, lymph nodes of multiple sites: Secondary | ICD-10-CM | POA: Diagnosis not present

## 2021-06-19 DIAGNOSIS — Z95828 Presence of other vascular implants and grafts: Secondary | ICD-10-CM

## 2021-06-19 MED ORDER — SODIUM CHLORIDE 0.9% FLUSH
10.0000 mL | INTRAVENOUS | Status: DC | PRN
  Administered 2021-06-19: 10 mL via INTRAVENOUS
  Filled 2021-06-19: qty 10

## 2021-06-19 MED ORDER — HEPARIN SOD (PORK) LOCK FLUSH 100 UNIT/ML IV SOLN
500.0000 [IU] | Freq: Once | INTRAVENOUS | Status: AC
  Administered 2021-06-19: 500 [IU] via INTRAVENOUS
  Filled 2021-06-19: qty 5

## 2021-06-19 MED ORDER — HEPARIN SOD (PORK) LOCK FLUSH 100 UNIT/ML IV SOLN
INTRAVENOUS | Status: AC
  Filled 2021-06-19: qty 5

## 2021-06-19 NOTE — Progress Notes (Signed)
History of Present Illness  There is no documented history at this time  Assessments & Plan   There are no diagnoses linked to this encounter.    Additional instructions  Subjective:  Patient presents with venous ulcer of the Bilateral lower extremity.    Procedure:  3 layer unna wrap was placed Bilateral lower extremity.   Plan:   Follow up in one week.  

## 2021-06-22 ENCOUNTER — Encounter (INDEPENDENT_AMBULATORY_CARE_PROVIDER_SITE_OTHER): Payer: Self-pay | Admitting: Nurse Practitioner

## 2021-06-26 ENCOUNTER — Ambulatory Visit (INDEPENDENT_AMBULATORY_CARE_PROVIDER_SITE_OTHER): Payer: Medicare Other | Admitting: Nurse Practitioner

## 2021-06-26 ENCOUNTER — Other Ambulatory Visit: Payer: Self-pay

## 2021-06-26 ENCOUNTER — Encounter (INDEPENDENT_AMBULATORY_CARE_PROVIDER_SITE_OTHER): Payer: Self-pay | Admitting: Nurse Practitioner

## 2021-06-26 VITALS — BP 126/66 | HR 66 | Resp 15 | Wt 223.6 lb

## 2021-06-26 DIAGNOSIS — I890 Lymphedema, not elsewhere classified: Secondary | ICD-10-CM | POA: Diagnosis not present

## 2021-06-26 NOTE — Progress Notes (Signed)
History of Present Illness  There is no documented history at this time  Assessments & Plan   There are no diagnoses linked to this encounter.    Additional instructions  Subjective:  Patient presents with venous ulcer of the Bilateral lower extremity.    Procedure:  3 layer unna wrap was placed Bilateral lower extremity.   Plan:   Follow up in one week.  

## 2021-07-03 ENCOUNTER — Other Ambulatory Visit: Payer: Self-pay

## 2021-07-03 ENCOUNTER — Ambulatory Visit (INDEPENDENT_AMBULATORY_CARE_PROVIDER_SITE_OTHER): Payer: Medicare Other | Admitting: Nurse Practitioner

## 2021-07-03 ENCOUNTER — Encounter (INDEPENDENT_AMBULATORY_CARE_PROVIDER_SITE_OTHER): Payer: Self-pay | Admitting: Nurse Practitioner

## 2021-07-03 VITALS — BP 131/73 | HR 63 | Ht 70.0 in | Wt 226.0 lb

## 2021-07-03 DIAGNOSIS — I890 Lymphedema, not elsewhere classified: Secondary | ICD-10-CM

## 2021-07-03 NOTE — Progress Notes (Signed)
History of Present Illness  There is no documented history at this time  Assessments & Plan   There are no diagnoses linked to this encounter.    Additional instructions  Subjective:  Patient presents with venous ulcer of the Bilateral lower extremity.    Procedure:  3 layer unna wrap was placed Bilateral lower extremity.   Plan:   Follow up in one week.  

## 2021-07-04 ENCOUNTER — Ambulatory Visit (INDEPENDENT_AMBULATORY_CARE_PROVIDER_SITE_OTHER): Payer: Medicare Other | Admitting: *Deleted

## 2021-07-04 DIAGNOSIS — I5022 Chronic systolic (congestive) heart failure: Secondary | ICD-10-CM

## 2021-07-04 DIAGNOSIS — E1151 Type 2 diabetes mellitus with diabetic peripheral angiopathy without gangrene: Secondary | ICD-10-CM

## 2021-07-04 NOTE — Chronic Care Management (AMB) (Signed)
Chronic Care Management   CCM RN Visit Note  07/04/2021 Name: Gary [name] MRN: [id number] DOB: May 20, 1946  Subjective: Gary [name] is a 75 y.o. year old male who is a primary care patient of [name], MD. The care management team was consulted for assistance with disease management and care coordination needs.    Engaged with patient by telephone for follow up visit in response to provider referral for case management and/or care coordination services.   Consent to Services:  The patient was given information about Chronic Care Management services, agreed to services, and gave verbal consent prior to initiation of services.  Please see initial visit note for detailed documentation.   Patient agreed to services and verbal consent obtained.   Assessment: Review of patient past medical history, allergies, medications, health status, including review of consultants reports, laboratory and other test data, was performed as part of comprehensive evaluation and provision of chronic care management services.   SDOH (Social Determinants of Health) assessments and interventions performed:    CCM Care Plan  Allergies  Allergen Reactions   Rofecoxib Nausea Only    Outpatient Encounter Medications as of 07/04/2021  Medication Sig   carvedilol (COREG) 3.125 MG tablet Take 1 tablet by mouth 2 (two) times daily with a meal.   Cholecalciferol (VITAMIN D3) 50 MCG (2000 UT) CAPS Take by mouth.   ferrous sulfate 325 (65 FE) MG tablet Take 325 mg by mouth daily with breakfast.   furosemide (LASIX) 40 MG tablet Take 40 mg by mouth 2 (two) times daily. 2 tablets in the morning 1 tablet in the afternoon as needed   glucose 4 GM chewable tablet Chew 1 tablet (4 g total) by mouth once as needed for low blood sugar. (Patient not taking: No sig reported)   insulin aspart (NOVOLOG) 100 UNIT/ML injection Basal rate 12am 0.8 units per hour, 9am 0.9 units per hour. (total basal insulin 20.7 units).  Carbohydrate ratio 1 units for every 8gm of carbohydrate. Correction factor 1 units for every 50mg /dl over target cbg. Target CBG 80-120   levothyroxine (SYNTHROID) 25 MCG tablet Take 25 mcg by mouth daily before breakfast.   nystatin cream (MYCOSTATIN) APPLY TO AFFECTED AREA TWICE A DAY   potassium chloride (KLOR-CON) 10 MEQ tablet TAKE 1 TABLET BY MOUTH TWICE DAILY FOR 3 DAYS THEN CONTINUE ONCE DAILY   pravastatin (PRAVACHOL) 40 MG tablet Take 40 mg by mouth daily.    vitamin B-12 (CYANOCOBALAMIN) 1000 MCG tablet Take 1,000 mcg by mouth daily.   Facility-Administered Encounter Medications as of 07/04/2021  Medication   heparin lock flush 100 unit/mL   sodium chloride flush (NS) 0.9 % injection 10 mL   sodium chloride flush (NS) 0.9 % injection 10 mL    Patient Active Problem List   Diagnosis Date Noted   Porokeratosis 05/15/2021   Aortic atherosclerosis ([place]) 05/05/2021   Emphysema lung ([place]) 05/05/2021   Thrombocytopenia ([place]) 04/29/2021   Splinter of foot without infection 02/23/2021   Arm laceration 02/09/2021   Hypokalemia 10/24/2020   Injury by nail 08/15/2020   Shingles 07/24/2020   Rash 07/02/2020   Colon cancer screening 07/02/2020   Sick sinus syndrome ([place]) 12/28/2019   Acquired absence of kidney 09/13/2019   Edema of lower extremity 09/13/2019   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, or unspecified 09/13/2019   Proteinuria 09/13/2019   Lower extremity pain, bilateral    Diabetic foot ulcer associated with type  1 diabetes mellitus ([place])    Acute on chronic systolic CHF (congestive heart failure) ([place])    Cellulitis 08/18/2019   Lower limb ulcer, calf ([place]) 08/01/2019   Left hip pain 07/16/2019   Pain due to onychomycosis of toenails of both feet 04/03/2019   Swelling of limb 02/07/2019   Pancytopenia ([place]) 12/03/2018   CKD (chronic kidney disease) stage 3, GFR 30-59 ml/min (HCC) 09/16/2018   Left shoulder pain  09/16/2018   Chronic cholecystitis 12/29/2017   Graves disease 12/29/2017   Peripheral neuropathy 12/29/2017   S/p nephrectomy 12/29/2017   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   Symptomatic bradycardia 12/02/2017   AKI (acute kidney injury) ([place]) 04/04/7627   Chronic systolic CHF (congestive heart failure) ([place]) 12/02/2017   Saturday night paralysis 11/12/2017   Hypoglycemia 10/28/2017   Goals of care, counseling/discussion 10/24/2017   Lymphedema 10/20/2017   Iron deficiency anemia 09/11/2017   Fall 08/13/2017   Humerus fracture 08/13/2017   Anemia 01/12/2017   Bilateral carotid artery stenosis 06/29/2016   Hand laceration 12/16/2015   Adjustment disorder with depressed mood 08/11/2015   Cardiomyopathy, idiopathic ([place]) 04/22/2015   CAD in native artery 11/02/2014   Mantle cell lymphoma ([place]) 08/03/2014   History of colonic polyps 04/29/2014   Irregular heart beat 04/22/2014   SOB (shortness of breath) 04/22/2014   Stress 03/21/2014   PVC (premature ventricular contraction) 02/21/2014   GERD (gastroesophageal reflux disease) 10/23/2013   Gastroparesis 06/25/2013   Chest pain 04/13/2013   Thyroid disease 04/13/2013   Diabetes ([place]) 04/13/2013   Essential hypertension, benign 04/13/2013   Hypercholesterolemia 04/13/2013    Conditions to be addressed/monitored:CHF and DMII  Care Plan : Heart Failure (Adult)  Updates made by Leona Singleton, RN since 07/04/2021 12:00 AM     Problem: Disease Progression (Heart Failure)   Priority: Medium     Long-Range Goal: Patient will report no heart failure exacerbations in the next 90 days   Start Date: 12/06/2020  Expected End Date: 10/10/2021  This Visit's Progress: On track  Recent Progress: On track  Priority: Medium  Note:   Current Barriers:  Knowledge deficit related to basic heart failure pathophysiology and self care management as evidenced by continued lower extremity edema requiring Unna Boots (changed every Thursday  at Vascular office).  Patient reports he has found his scale and is now weighing about every other day.  Has not weighed this morning a of yet (226 yesterday)  but weights have ranged (223-226).   Reports lower extremity edema is about the same (better than a year ago) and denies any increase in shortness of breath at this time.   Patient does not have transportation to provider appointments Transportation Barriers; going to use [facility] transportation for appointments within [facility] system and using [employer] for other medical appointments Lacks social connections Unable to perform IADLs independently Case Manager Clinical Goal(s):  patient will verbalize understanding of Heart Failure Action Plan and when to call doctor patient will take all Heart Failure mediations as prescribed patient will weigh daily and record (notifying MD of 3 lb weight gain over night or 5 lb in a week) Interventions:  Collaboration with [name], MD regarding development and update of comprehensive plan of care as evidenced by provider attestation and co-signature Inter-disciplinary care team collaboration (see longitudinal plan of care) Provided verbal education on low sodium diet Advised patient to weigh each morning after emptying bladder Discussed importance of daily weight and advised patient to weigh and record  daily and encouraged patient to do so; Reinforced need to weight daily and encouraged patient to use walker in front of scale to help balance while standing on scale Congratulated patient on finding scale at home, discussed importance of daily weight monitoring, when to weigh, and when to call provider based on weight  Congratulated on finding walker so it can be used to stand on scale, also encouraged to use walker with ambulation Reviewed signs and symptoms of heart failure exacerbation Encouraged elevation of lower extremities as able Encouraged continued use of [facility] [employer] and [employer]  transportation Discussed medications and encouraged medication compliance Encouraged to use 2022 [place] previously sent to help with logging of weights and other vital signs Discussed importance of weighing at home and not just at providers office Patient Goals/Self-Care Activities Call office if I gain more than 2 pounds in one day or 5 pounds in one week Use salt in moderation, low salt heart healthy diabetic diet Weigh myself daily and write in log for provider review (at least start to weigh a few times a week working way up to daily) Take medications as prescribed Place walker in front of scale to help balance self while standing to weigh (please consider trying this to weigh yourself) Follow Up Plan: The care management team will reach out to the patient again over the next 30 business days.     Care Plan : Diabetes  Updates made by Leona Singleton, RN since 07/04/2021 12:00 AM     Problem: Hypoglycemia causing syncopal episodes   Priority: Medium     Long-Range Goal: Patient will report maintaing Hgb A1C of 7 or below in the next 90 days   Start Date: 01/27/2021  Expected End Date: 10/10/2021  This Visit's Progress: On track  Recent Progress: On track  Priority: Medium  Note:   Objective:  Lab Results  Component Value Date   HGBA1C 7.0 01/23/2021  Current Barriers:  Knowledge Deficits related to basic Diabetes pathophysiology and self care/management as evidenced by multiple syncopal episodes related to hypoglycemia.  Latest Hgb A1C 6.9 on 04/29/21.  Patient reports fasting blood sugar of 121 this morning with recent fasting ranges of 60-70.  States he has had to turn off insulin pump related to hypoglycemia in the last 2 weeks.  Denies fall in the last 10 weeks. Limited Social Support Trouble getting insulin pump supplies-reports he now has insulin pump sensors Case Manager Clinical Goal(s):  patient will demonstrate improved adherence to prescribed treatment  plan for diabetes self care/management as evidenced by: at least 4 times a day monitoring and recording of CBG, adherence to ADA/ carb modified diet, adherence to prescribed medication regimen, contacting provider for new or worsened symptoms or questions Interventions:  Collaboration with [name], MD regarding development and update of comprehensive plan of care as evidenced by provider attestation and co-signature Inter-disciplinary care team collaboration (see longitudinal plan of care) Provided education to patient about basic DM disease process Discussed medications and encouraged medication compliance Discussed plans with patient for ongoing care management follow up and provided patient with direct contact information for care management team Encouraged continued participation with CCM Pharmacist  Reviewed glucose ranges and hypoglycemic events; and discussed dangers of hypoglycemia and treatment options, need for immediate sugar replacement with juice, sugar, soda in addition to snack or candy bar or glucose tablets Discussed proper treatment of hypoglycemia Reviewed scheduled/upcoming provider appointments including:   Discussed healthy meal and snack options Rediscussed  possible Home Health Nursing or Therapy referral for safety evaluation after 2 recent falls, educating and discussing even if they start services, patient can cancel at any time (patient still declines at this time) Discussed and encouraged patient to be proactive with health instead of reactive after falls Fall precautions and preventions reviewed and discussed, encouraged to use walker with all ambulation,  Instructed and encouraged to wear life alert at all times Discussed logging blood sugars and writing down insulin doses taken to help provider manage medications; sent 2022 Calendar Booklet to help with keeping log of blood sugars and insulin doses Reviewed and discussed proper diabetic foot care, encouraged  patient to keep check on right foot splinter wound for signs and symptoms of infection Discussed and encouraged and reinforced patient to wear bedroom/house/slides while in the home and not to walk around bare foot Discussed with patient asking podiatrist for prescription for diabetic shoes Encouraged to contact Endocrinology if he has to turn insulin pump off again for hypoglycemia Patient Goals/Self-Care Activities Check blood sugar at least 5 times a day Check blood sugar if I feel it is too high or too low Take the blood sugar meter and log to all doctor visits  Consider drinking Boost or other supplement in the evenings if not eating a complete meal Make sure to have candy or glucose tablets on you at all times; try drinking coke along with eating snack to treat hypoglycemia Eat at least 3 meals a day with snack in between to help prevent hypoglycemia Please consider Home Health Nursing/Therapy involvement Fall precautions and preventions; Wear life alert at all times Contact Endocrinology for sustained hypoglycemia  Follow Up Plan: The care management team will reach out to the patient again over the next 30 business days.        Plan:The care management team will reach out to the patient again over the next 30 business days.  [name] RN, MSN RN Care Management Coordinator [place] [phone] [email or web address]

## 2021-07-04 NOTE — Patient Instructions (Signed)
Visit Information  PATIENT GOALS:  Goals Addressed             This Visit's Progress    (RNCM) Monitor and Manage My Blood Sugar-Diabetes Type 1   On track    Timeframe:  Long-Range Goal Priority:  High Start Date:   12/06/20                          Expected End Date:  12/09/21                    Follow Up Date 08/11/2021    Check blood sugar at least 5 times a day Check blood sugar if I feel it is too high or too low Take the blood sugar meter and log to all doctor visits  Consider drinking Boost or other supplement in the evenings if not eating a complete meal Make sure to have candy or glucose tablets on you at all times; try drinking coke along with eating snack to treat hypoglycemia Eat at least 3 meals a day with snack in between to help prevent hypoglycemia Please consider Home Health Nursing/Therapy involvement Fall precautions and preventions; Wear life alert at all times Contact Endocrinology for sustained hypoglycemia   Why is this important?   Checking your blood sugar at home helps to keep it from getting very high or very low.  Writing the results in a diary or log helps the doctor know how to care for you.  Your blood sugar log should have the time, the date and the results.  Also, write down the amount of insulin or other medicine you take.  Other information like what you ate, exercise done and how you were feeling will also be helpful..     Notes:      (RNCM) Track and Manage Fluids and Swelling-Heart Failure   On track    Timeframe:  Long-Range Goal Priority:  Medium Start Date:   12/06/20                          Expected End Date:  12/09/21                    Follow Up Date 08/11/21   Call office if I gain more than 2 pounds in one day or 5 pounds in one week Use salt in moderation, low salt heart healthy diabetic diet Weigh myself daily and write in log for provider review (at least start to weigh a few times a week working way up to daily) Take  medications as prescribed Place walker in front of scale to help balance self while standing to weigh (please consider trying this to weigh yourself)   Why is this important?   It is important to check your weight daily and watch how much salt and liquids you have.  It will help you to manage your heart failure.    Notes:         Patient verbalizes understanding of instructions provided today and agrees to view in [place].   The care management team will reach out to the patient again over the next 30 business days.   [name] RN, MSN RN Care Management Coordinator [place] [phone] [email or web address]

## 2021-07-10 ENCOUNTER — Ambulatory Visit (INDEPENDENT_AMBULATORY_CARE_PROVIDER_SITE_OTHER): Payer: Medicare Other | Admitting: Nurse Practitioner

## 2021-07-10 ENCOUNTER — Ambulatory Visit (INDEPENDENT_AMBULATORY_CARE_PROVIDER_SITE_OTHER): Payer: Medicare Other | Admitting: Internal Medicine

## 2021-07-10 ENCOUNTER — Other Ambulatory Visit: Payer: Self-pay | Admitting: Internal Medicine

## 2021-07-10 ENCOUNTER — Encounter (INDEPENDENT_AMBULATORY_CARE_PROVIDER_SITE_OTHER): Payer: Self-pay | Admitting: Nurse Practitioner

## 2021-07-10 ENCOUNTER — Other Ambulatory Visit: Payer: Self-pay

## 2021-07-10 VITALS — BP 148/68 | HR 96 | Resp 16 | Wt 229.0 lb

## 2021-07-10 VITALS — BP 112/62 | HR 66 | Temp 97.8°F | Resp 16 | Ht 70.0 in | Wt 230.0 lb

## 2021-07-10 DIAGNOSIS — I890 Lymphedema, not elsewhere classified: Secondary | ICD-10-CM

## 2021-07-10 DIAGNOSIS — I700 Atherosclerosis of aorta: Secondary | ICD-10-CM

## 2021-07-10 DIAGNOSIS — I10 Essential (primary) hypertension: Secondary | ICD-10-CM | POA: Diagnosis not present

## 2021-07-10 DIAGNOSIS — D509 Iron deficiency anemia, unspecified: Secondary | ICD-10-CM

## 2021-07-10 DIAGNOSIS — I495 Sick sinus syndrome: Secondary | ICD-10-CM | POA: Diagnosis not present

## 2021-07-10 DIAGNOSIS — I428 Other cardiomyopathies: Secondary | ICD-10-CM | POA: Diagnosis not present

## 2021-07-10 DIAGNOSIS — I2510 Atherosclerotic heart disease of native coronary artery without angina pectoris: Secondary | ICD-10-CM

## 2021-07-10 DIAGNOSIS — J439 Emphysema, unspecified: Secondary | ICD-10-CM | POA: Diagnosis not present

## 2021-07-10 DIAGNOSIS — E1151 Type 2 diabetes mellitus with diabetic peripheral angiopathy without gangrene: Secondary | ICD-10-CM

## 2021-07-10 DIAGNOSIS — C8318 Mantle cell lymphoma, lymph nodes of multiple sites: Secondary | ICD-10-CM

## 2021-07-10 DIAGNOSIS — I5022 Chronic systolic (congestive) heart failure: Secondary | ICD-10-CM

## 2021-07-10 DIAGNOSIS — N1832 Chronic kidney disease, stage 3b: Secondary | ICD-10-CM

## 2021-07-10 DIAGNOSIS — D696 Thrombocytopenia, unspecified: Secondary | ICD-10-CM

## 2021-07-10 DIAGNOSIS — Z23 Encounter for immunization: Secondary | ICD-10-CM | POA: Diagnosis not present

## 2021-07-10 DIAGNOSIS — F439 Reaction to severe stress, unspecified: Secondary | ICD-10-CM

## 2021-07-10 DIAGNOSIS — E079 Disorder of thyroid, unspecified: Secondary | ICD-10-CM

## 2021-07-10 DIAGNOSIS — I429 Cardiomyopathy, unspecified: Secondary | ICD-10-CM

## 2021-07-10 DIAGNOSIS — E7800 Pure hypercholesterolemia, unspecified: Secondary | ICD-10-CM

## 2021-07-10 DIAGNOSIS — D61818 Other pancytopenia: Secondary | ICD-10-CM | POA: Diagnosis not present

## 2021-07-10 NOTE — Progress Notes (Signed)
[name], male   DOB: 1946/06/02, 75 y.o.   MRN: [id number]   Subjective:    [name], male    DOB: [birth year], 75 y.o.   MRN: [id number]  This visit occurred during the SARS-CoV-2 public health emergency.  Safety protocols were in place, including screening questions prior to the visit, additional usage of staff PPE, and extensive cleaning of exam room while observing appropriate contact time as indicated for disinfecting solutions.   Patient here for a scheduled follow up.  Chief Complaint  Patient presents with   Diabetes   .   HPI Here to f/u regarding his diabetes, cholesterol and blood pressure.  Also increased stress.  Has to go to court next week to continue the restraining order against his son.  He reports increased stress related to this.  Discussed.  Sister is supportive.  Is doing better overall.  Seeing Dr Honor Junes for his sugars.  Not having as much of an issue with low sugar.  No chest pain.  Breathing overall stable.  Eating.  No vomiting.  Bowels moving. Seeing vascular for his lower extremity swelling.  Legs wrapped.     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    Gastroparesis    Graves disease    Hypercholesterolemia    Hypertension    Mantle cell lymphoma ([place]) 07/2014   Peripheral neuropathy    Shingles    Past Surgical History:  Procedure Laterality Date   CHOLECYSTECTOMY     NEPHRECTOMY     right   PACEMAKER INSERTION N/A 12/07/2017   Procedure: INSERTION PACEMAKER DUAL CHAMBER INITIAL INSERT;  Surgeon: Isaias Cowman, MD;  Location: [facility];  Service: Cardiovascular;  Laterality: N/A;   PORTA CATH INSERTION N/A 11/03/2017   Procedure: PORTA CATH INSERTION;  Surgeon: Katha Cabal, MD;  Location: [facility];  Service: Cardiovascular;  Laterality: N/A;   Family  History  Problem Relation Age of Onset   Breast cancer Mother    Diabetes Father    Cancer Father        Lymphoma   Alcohol abuse Maternal Uncle    Diabetes Sister    Heart disease Other        grandfather   Social History   Socioeconomic History   Marital status: Widowed    Spouse name: Not on file   Number of children: 1   Years of education: 37   Highest education level: 12th grade  Occupational History   Occupation: Retired  Tobacco Use   Smoking status: Former   Smokeless tobacco: Current    Types: Nurse, children's Use: Never used  Substance and Sexual Activity   Alcohol use: Yes    Alcohol/week: 3.0 standard drinks    Types: 3 Cans of beer per week    Comment: nightly   Drug use: Never   Sexual activity: Not Currently  Other Topics Concern   Not on file  Social History Narrative   Not on file   Social Determinants of Health   Financial Resource Strain: Low Risk    Difficulty of Paying Living Expenses: Not hard at all  Food Insecurity: No Food Insecurity   Worried About Charity fundraiser in the Last Year: Never true   Arboriculturist in  the Last Year: Never true  Transportation Needs: No Transportation Needs   Lack of Transportation (Medical): No   Lack of Transportation (Non-Medical): No  Physical Activity: Inactive   Days of Exercise per Week: 0 days   Minutes of Exercise per Session: 0 min  Stress: Stress Concern Present   Feeling of Stress : To some extent  Social Connections: Socially Isolated   Frequency of Communication with Friends and Family: More than three times a week   Frequency of Social Gatherings with Friends and Family: Twice a week   Attends Religious Services: Never   Marine scientist or Organizations: No   Attends Archivist Meetings: Never   Marital Status: Widowed     Review of Systems  Constitutional:  Negative for appetite change and unexpected weight change.  HENT:  Negative for congestion and  sinus pressure.   Respiratory:  Negative for cough and chest tightness.        Breathing stable.   Cardiovascular:  Negative for chest pain and palpitations.       Chronic leg swelling. Overall improved.   Gastrointestinal:  Negative for abdominal pain, diarrhea, nausea and vomiting.  Genitourinary:  Negative for difficulty urinating and dysuria.  Musculoskeletal:  Negative for joint swelling and myalgias.  Skin:  Negative for color change and rash.  Neurological:  Negative for dizziness and headaches.  Psychiatric/Behavioral:  Negative for agitation and dysphoric mood.       Objective:     BP 112/62   Pulse 66   Temp 97.8 F (36.6 C)   Resp 16   Ht _0  (1.778 m)   Wt 230 lb (104.3 kg)   SpO2 99%   BMI 33.00 kg/m  Wt Readings from Last 3 Encounters:  07/17/21 227 lb (103 kg)  07/10/21 230 lb (104.3 kg)  07/10/21 229 lb (103.9 kg)    Physical Exam Constitutional:      General: He is not in acute distress.    Appearance: Normal appearance. He is well-developed.  HENT:     Head: Normocephalic and atraumatic.     Right Ear: External ear normal.     Left Ear: External ear normal.  Eyes:     General: No scleral icterus.       Right eye: No discharge.        Left eye: No discharge.  Cardiovascular:     Rate and Rhythm: Normal rate and regular rhythm.  Pulmonary:     Effort: Pulmonary effort is normal. No respiratory distress.     Breath sounds: Normal breath sounds.  Abdominal:     General: Bowel sounds are normal.     Palpations: Abdomen is soft.     Tenderness: There is no abdominal tenderness.  Musculoskeletal:        General: No tenderness.     Cervical back: Neck supple. No tenderness.     Comments: Chronic lower extremity swelling.  Improved.  Legs wrapped.   Lymphadenopathy:     Cervical: No cervical adenopathy.  Skin:    Findings: No erythema or rash.  Neurological:     Mental Status: He is alert.  Psychiatric:        Mood and Affect: Mood normal.         Behavior: Behavior normal.     Outpatient Encounter Medications as of 07/10/2021  Medication Sig   carvedilol (COREG) 3.125 MG tablet Take 1 tablet by mouth 2 (two) times daily with a meal.  Cholecalciferol (VITAMIN D3) 50 MCG (2000 UT) CAPS Take by mouth.   ferrous sulfate 325 (65 FE) MG tablet Take 325 mg by mouth daily with breakfast.   furosemide (LASIX) 40 MG tablet Take 40 mg by mouth 2 (two) times daily. 2 tablets in the morning 1 tablet in the afternoon as needed   glucose 4 GM chewable tablet Chew 1 tablet (4 g total) by mouth once as needed for low blood sugar.   insulin aspart (NOVOLOG) 100 UNIT/ML injection Basal rate 12am 0.8 units per hour, 9am 0.9 units per hour. (total basal insulin 20.7 units). Carbohydrate ratio 1 units for every 8gm of carbohydrate. Correction factor 1 units for every 46m/dl over target cbg. Target CBG 80-120   levothyroxine (SYNTHROID) 25 MCG tablet Take 25 mcg by mouth daily before breakfast.   potassium chloride (KLOR-CON) 10 MEQ tablet TAKE 1 TABLET BY MOUTH TWICE DAILY FOR 3 DAYS THEN CONTINUE ONCE DAILY   pravastatin (PRAVACHOL) 40 MG tablet Take 40 mg by mouth daily.    vitamin B-12 (CYANOCOBALAMIN) 1000 MCG tablet Take 1,000 mcg by mouth daily.   [DISCONTINUED] nystatin cream (MYCOSTATIN) APPLY TO AFFECTED AREA TWICE A DAY   Facility-Administered Encounter Medications as of 07/10/2021  Medication   heparin lock flush 100 unit/mL   sodium chloride flush (NS) 0.9 % injection 10 mL   sodium chloride flush (NS) 0.9 % injection 10 mL     Lab Results  Component Value Date   WBC 7.2 04/29/2021   HGB 13.5 04/29/2021   HCT 39.1 04/29/2021   PLT 149.0 (L) 04/29/2021   GLUCOSE 161 (H) 05/20/2021   CHOL 126 04/29/2021   TRIG 50.0 04/29/2021   HDL 44.20 04/29/2021   LDLCALC 72 04/29/2021   ALT 4 04/29/2021   AST 8 04/29/2021   NA 139 05/20/2021   K 3.7 06/10/2021   CL 101 05/20/2021   CREATININE 1.46 05/20/2021   BUN 19 05/20/2021   CO2  26 05/20/2021   TSH 1.81 04/29/2021   INR 1.12 07/14/2018   HGBA1C 7.0 01/23/2021   MICROALBUR 1.9 09/22/2016    CT Abdomen Pelvis Wo Contrast  Result Date: 01/25/2021 CLINICAL DATA:  Restaging mantle cell lymphoma EXAM: CT CHEST, ABDOMEN AND PELVIS WITHOUT CONTRAST TECHNIQUE: Multidetector CT imaging of the chest, abdomen and pelvis was performed following the standard protocol without IV contrast. COMPARISON:  07/29/2020 FINDINGS: CT CHEST FINDINGS Cardiovascular: Right Port-A-Cath tip: SVC. Dual lead pacer noted with proximal and distal lead tips in the right atrium and ventricle, respectively. Left anterior descending and right coronary artery atherosclerotic calcification. Mediastinum/Nodes: No pathologic adenopathy in the chest identified. Lungs/Pleura: Mild lingular scarring. Mild scarring in the posterior basal segment right lower lobe. Musculoskeletal: Various old rib fractures are present. However, there is subacute fractures of the right fourth, fifth, sixth, and seventh ribs laterally which were not present on 07/19/2020. Chronic deformity of the right proximal humerus. Thoracic kyphosis. CT ABDOMEN PELVIS FINDINGS Hepatobiliary: Cholecystectomy.  Otherwise unremarkable. Pancreas: Unremarkable Spleen: Unremarkable.  No splenomegaly. Adrenals/Urinary Tract: Absent right kidney. Both adrenal glands appear unremarkable. Left kidney unremarkable. Stomach/Bowel: Unremarkable Vascular/Lymphatic: Aortoiliac atherosclerotic vascular disease. Left common iliac node 1.0 cm in short axis on image 88 series 2, previously the same by my measurements. Right common iliac node 0.8 cm in short axis on image 95 series 2, previously the same by my measurements. Reproductive: Mild prostatomegaly. Other: No supplemental non-categorized findings. Musculoskeletal: Moderate degenerative hip arthropathy bilaterally. IMPRESSION: 1. No overtly pathologic adenopathy in  the chest, abdomen, or pelvis, although there is a  borderline enlarged but stable left common iliac node 1.0 cm in short axis. No splenomegaly 2. Subacute interval fractures of the right fourth, fifth, sixth, and seventh ribs. 3. Other imaging findings of potential clinical significance: Aortic Atherosclerosis (ICD10-I70.0). Coronary atherosclerosis. Chronic deformity of the right proximal humerus. Mild prostatomegaly. Absent right kidney. Electronically Signed   By: Van Clines M.D.   On: 01/25/2021 17:57   CT Chest Wo Contrast  Result Date: 01/25/2021 CLINICAL DATA:  Restaging mantle cell lymphoma EXAM: CT CHEST, ABDOMEN AND PELVIS WITHOUT CONTRAST TECHNIQUE: Multidetector CT imaging of the chest, abdomen and pelvis was performed following the standard protocol without IV contrast. COMPARISON:  07/29/2020 FINDINGS: CT CHEST FINDINGS Cardiovascular: Right Port-A-Cath tip: SVC. Dual lead pacer noted with proximal and distal lead tips in the right atrium and ventricle, respectively. Left anterior descending and right coronary artery atherosclerotic calcification. Mediastinum/Nodes: No pathologic adenopathy in the chest identified. Lungs/Pleura: Mild lingular scarring. Mild scarring in the posterior basal segment right lower lobe. Musculoskeletal: Various old rib fractures are present. However, there is subacute fractures of the right fourth, fifth, sixth, and seventh ribs laterally which were not present on 07/19/2020. Chronic deformity of the right proximal humerus. Thoracic kyphosis. CT ABDOMEN PELVIS FINDINGS Hepatobiliary: Cholecystectomy.  Otherwise unremarkable. Pancreas: Unremarkable Spleen: Unremarkable.  No splenomegaly. Adrenals/Urinary Tract: Absent right kidney. Both adrenal glands appear unremarkable. Left kidney unremarkable. Stomach/Bowel: Unremarkable Vascular/Lymphatic: Aortoiliac atherosclerotic vascular disease. Left common iliac node 1.0 cm in short axis on image 88 series 2, previously the same by my measurements. Right common iliac  node 0.8 cm in short axis on image 95 series 2, previously the same by my measurements. Reproductive: Mild prostatomegaly. Other: No supplemental non-categorized findings. Musculoskeletal: Moderate degenerative hip arthropathy bilaterally. IMPRESSION: 1. No overtly pathologic adenopathy in the chest, abdomen, or pelvis, although there is a borderline enlarged but stable left common iliac node 1.0 cm in short axis. No splenomegaly 2. Subacute interval fractures of the right fourth, fifth, sixth, and seventh ribs. 3. Other imaging findings of potential clinical significance: Aortic Atherosclerosis (ICD10-I70.0). Coronary atherosclerosis. Chronic deformity of the right proximal humerus. Mild prostatomegaly. Absent right kidney. Electronically Signed   By: Van Clines M.D.   On: 01/25/2021 17:57       Assessment & Plan:   Problem List Items Addressed This Visit     Aortic atherosclerosis ([place])    Continue pravastatin.       CAD in native artery    Continue risk factor modification.  Followed by cardiology.       Cardiomyopathy, idiopathic (HCC)    On lasix.  Breathing stable.  Discussed weighing daily.  Low sodium diet.  Follow.       Chronic systolic CHF (congestive heart failure) (HCC)    On lasix.  Discussed the need to follow weight.   Breathing stable.       CKD (chronic kidney disease) stage 3, GFR 30-59 ml/min (HCC)    Has been evaluated by Dr Candiss Norse.  Avoid antiinflammatories.  Follow metabolic panel.       Diabetes [facility])    Being followed by endocrinology.  Has sensor to hopefully avoid problems with low sugars.  Follow met b and a1c.  Continues with insulin pump.       Emphysema lung (HCC)    Breathing stable.        Essential hypertension, benign    On coreg and lasix.  Blood pressure  as outlined.  Follow pressures.  Follow metabolic panel.       Hypercholesterolemia    Continue pravastatin.  Low cholesterol diet and exercise.  Follow lipid panel and liver  function tests.       Iron deficiency anemia    Follow cbc and iron studies.       Lymphedema    Legs wrapped.  Sees vascular surgery regularly for wrapping and f/u.       Mantle cell lymphoma ([place])    Has been followed by oncology.       Pancytopenia ([place])    Has been followed by oncology.       Sick sinus syndrome [facility])    S/p pacemaker placement.  Followed by cardiology.       Stress    Increased stress.  Discussed. Follow.       Thrombocytopenia (HCC)    Last platelet count improved.  Still slightly decreased, but improved - 149.  Follow.       Thyroid disease    Followed by endocrinology.       Other Visit Diagnoses     Need for immunization against influenza    -  Primary   Relevant Orders   Flu Vaccine QUAD High Dose(Fluad) (Completed)        [name], MD

## 2021-07-10 NOTE — Progress Notes (Signed)
History of Present Illness  There is no documented history at this time  Assessments & Plan   There are no diagnoses linked to this encounter.    Additional instructions  Subjective:  Patient presents with venous ulcer of the Bilateral lower extremity.    Procedure:  3 layer unna wrap was placed Bilateral lower extremity.   Plan:   Follow up in one week.  

## 2021-07-11 ENCOUNTER — Other Ambulatory Visit: Payer: Self-pay | Admitting: Internal Medicine

## 2021-07-11 DIAGNOSIS — E1151 Type 2 diabetes mellitus with diabetic peripheral angiopathy without gangrene: Secondary | ICD-10-CM

## 2021-07-11 DIAGNOSIS — I5022 Chronic systolic (congestive) heart failure: Secondary | ICD-10-CM | POA: Diagnosis not present

## 2021-07-11 NOTE — Telephone Encounter (Signed)
Okay to refill? 

## 2021-07-13 ENCOUNTER — Encounter (INDEPENDENT_AMBULATORY_CARE_PROVIDER_SITE_OTHER): Payer: Self-pay | Admitting: Nurse Practitioner

## 2021-07-15 ENCOUNTER — Other Ambulatory Visit: Payer: Self-pay | Admitting: Internal Medicine

## 2021-07-17 ENCOUNTER — Ambulatory Visit (INDEPENDENT_AMBULATORY_CARE_PROVIDER_SITE_OTHER): Payer: Medicare Other | Admitting: Nurse Practitioner

## 2021-07-17 ENCOUNTER — Other Ambulatory Visit: Payer: Self-pay

## 2021-07-17 VITALS — BP 119/72 | HR 69 | Ht 71.0 in | Wt 227.0 lb

## 2021-07-17 DIAGNOSIS — I890 Lymphedema, not elsewhere classified: Secondary | ICD-10-CM

## 2021-07-17 DIAGNOSIS — I10 Essential (primary) hypertension: Secondary | ICD-10-CM

## 2021-07-17 DIAGNOSIS — E1151 Type 2 diabetes mellitus with diabetic peripheral angiopathy without gangrene: Secondary | ICD-10-CM | POA: Diagnosis not present

## 2021-07-19 ENCOUNTER — Encounter: Payer: Self-pay | Admitting: Internal Medicine

## 2021-07-19 NOTE — Assessment & Plan Note (Signed)
Continue risk factor modification.  Followed by cardiology.  

## 2021-07-19 NOTE — Assessment & Plan Note (Signed)
Has been followed by oncology.  

## 2021-07-19 NOTE — Assessment & Plan Note (Signed)
Being followed by endocrinology.  Has sensor to hopefully avoid problems with low sugars.  Follow met b and a1c.  Continues with insulin pump.

## 2021-07-19 NOTE — Assessment & Plan Note (Signed)
On lasix.  Breathing stable.  Discussed weighing daily.  Low sodium diet.  Follow.  °

## 2021-07-19 NOTE — Assessment & Plan Note (Signed)
Last platelet count improved.  Still slightly decreased, but improved - 149.  Follow.

## 2021-07-19 NOTE — Assessment & Plan Note (Signed)
Follow cbc and iron studies.  

## 2021-07-19 NOTE — Assessment & Plan Note (Signed)
Breathing stable.

## 2021-07-19 NOTE — Assessment & Plan Note (Signed)
Legs wrapped.  Sees vascular surgery regularly for wrapping and f/u.  °

## 2021-07-19 NOTE — Assessment & Plan Note (Signed)
Continue pravastatin 

## 2021-07-19 NOTE — Assessment & Plan Note (Signed)
Increased stress.  Discussed.  Follow.  

## 2021-07-19 NOTE — Assessment & Plan Note (Signed)
Followed by endocrinology 

## 2021-07-19 NOTE — Assessment & Plan Note (Signed)
S/p pacemaker placement.  Followed by cardiology.  

## 2021-07-19 NOTE — Assessment & Plan Note (Signed)
On coreg and lasix.  Blood pressure as outlined.  Follow pressures.  Follow metabolic panel.  

## 2021-07-19 NOTE — Assessment & Plan Note (Signed)
Continue pravastatin.  Low cholesterol diet and exercise.  Follow lipid panel and liver function tests.   

## 2021-07-19 NOTE — Assessment & Plan Note (Signed)
On lasix.  Discussed the need to follow weight.   Breathing stable.  

## 2021-07-19 NOTE — Assessment & Plan Note (Signed)
Has been evaluated by Dr Singh.  Avoid antiinflammatories.  Follow metabolic panel.  

## 2021-07-24 ENCOUNTER — Other Ambulatory Visit: Payer: Self-pay

## 2021-07-24 ENCOUNTER — Ambulatory Visit (INDEPENDENT_AMBULATORY_CARE_PROVIDER_SITE_OTHER): Payer: Medicare Other | Admitting: Nurse Practitioner

## 2021-07-24 VITALS — BP 122/72 | HR 74 | Ht 70.0 in | Wt 230.0 lb

## 2021-07-24 DIAGNOSIS — I890 Lymphedema, not elsewhere classified: Secondary | ICD-10-CM | POA: Diagnosis not present

## 2021-07-24 NOTE — Progress Notes (Signed)
History of Present Illness  There is no documented history at this time  Assessments & Plan   There are no diagnoses linked to this encounter.    Additional instructions  Subjective:  Patient presents with venous ulcer of the Bilateral lower extremity.    Procedure:  3 layer unna wrap was placed Bilateral lower extremity.   Plan:   Follow up in one week.  

## 2021-07-27 ENCOUNTER — Encounter (INDEPENDENT_AMBULATORY_CARE_PROVIDER_SITE_OTHER): Payer: Self-pay | Admitting: Nurse Practitioner

## 2021-07-27 NOTE — Progress Notes (Signed)
Subjective:    [name], male    DOB: 31-Jan-1946, 75 y.o.   MRN: [id number] Chief Complaint  Patient presents with  . Follow-up    6wk bil unna boot check     Gary [name] is a 75 year old male that presents today for Unna wrap check however the patient had an incident where he had some soreness from the wrap and attempted to move it away from the area that was sore.  When doing that he remove the wrap down because of the tourniquet like effect.  The area above this has grossly swollen with several small wounds or ulcerations.  There is evidence of cutting to the skin somewhat and also cause some wounds.  These wounds are very superficial.  There is no sign or symptom of infection.  Review of Systems  Cardiovascular:  Positive for leg swelling.  Skin:  Positive for wound.  All other systems reviewed and are negative.     Objective:   Physical Exam Vitals reviewed.  HENT:     Head: Normocephalic.  Cardiovascular:     Rate and Rhythm: Normal rate.     Pulses:          Dorsalis pedis pulses are 1+ on the right side and 1+ on the left side.  Pulmonary:     Effort: Pulmonary effort is normal.  Musculoskeletal:     Right lower leg: 1+ Edema present.     Left lower leg: 3+ Edema present.  Skin:    General: Skin is warm and dry.     Comments: Scattered superficial wounds on right lower extremity  Neurological:     Mental Status: He is alert and oriented to person, place, and time.  Psychiatric:        Mood and Affect: Mood normal.        Behavior: Behavior normal.        Thought Content: Thought content normal.        Judgment: Judgment normal.    BP 119/72   Pulse 69   Ht 5\' 11"  (1.803 m)   Wt 227 lb (103 kg)   BMI 31.66 kg/m   Past Medical History:  Diagnosis Date  . CHF (congestive heart failure) ([place])   . Depression   . 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 Gastroparesis   . Graves disease    . Hypercholesterolemia   . Hypertension   . Mantle cell lymphoma ([place]) 07/2014  . Peripheral neuropathy   . Shingles     Social History   Socioeconomic History  . Marital status: Widowed    Spouse name: Not on file  . Number of children: 1  . Years of education: 67  . Highest education level: 12th grade  Occupational History  . Occupation: Retired  Tobacco Use  . Smoking status: Former  . Smokeless tobacco: Current    Types: Chew  Vaping Use  . Vaping Use: Never used  Substance and Sexual Activity  . Alcohol use: Yes    Alcohol/week: 3.0 standard drinks    Types: 3 Cans of beer per week    Comment: nightly  . Drug use: Never  . Sexual activity: Not Currently  Other Topics Concern  . Not on file  Social History Narrative  . Not on file   Social Determinants of Health   Financial Resource Strain: Low Risk   . Difficulty of Paying Living Expenses: Not  hard at all  Food Insecurity: No Food Insecurity  . Worried About Charity fundraiser in the Last Year: Never true  . Ran Out of Food in the Last Year: Never true  Transportation Needs: No Transportation Needs  . Lack of Transportation (Medical): No  . Lack of Transportation (Non-Medical): No  Physical Activity: Inactive  . Days of Exercise per Week: 0 days  . Minutes of Exercise per Session: 0 min  Stress: Stress Concern Present  . Feeling of Stress : To some extent  Social Connections: Socially Isolated  . Frequency of Communication with Friends and Family: More than three times a week  . Frequency of Social Gatherings with Friends and Family: Twice a week  . Attends Religious Services: Never  . Active Member of Clubs or Organizations: No  . Attends Archivist Meetings: Never  . Marital Status: Widowed  Intimate Partner Violence: Not At Risk  . Fear of Current or Ex-Partner: No  . Emotionally Abused: No  . Physically Abused: No  . Sexually Abused: No    Past Surgical History:  Procedure  Laterality Date  . CHOLECYSTECTOMY    . NEPHRECTOMY     right  . PACEMAKER INSERTION N/A 12/07/2017   Procedure: INSERTION PACEMAKER DUAL CHAMBER INITIAL INSERT;  Surgeon: Isaias Cowman, MD;  Location: [facility];  Service: Cardiovascular;  Laterality: N/A;  . PORTA CATH INSERTION N/A 11/03/2017   Procedure: PORTA CATH INSERTION;  Surgeon: Katha Cabal, MD;  Location: [facility];  Service: Cardiovascular;  Laterality: N/A;    Family History  Problem Relation Age of Onset  . Breast cancer Mother   . Diabetes Father   . Cancer Father        Lymphoma  . Alcohol abuse Maternal Uncle   . Diabetes Sister   . Heart disease Other        grandfather    Allergies  Allergen Reactions  . Rofecoxib Nausea Only    CBC Latest Ref Rng & Units 04/29/2021 07/29/2020 06/24/2020  WBC 4.0 - 10.5 K/uL 7.2 6.1 6.3  Hemoglobin 13.0 - 17.0 g/dL 13.5 13.9 13.1  Hematocrit 39.0 - 52.0 % 39.1 40.0 39.0  Platelets 150.0 - 400.0 K/uL 149.0(L) 102(L) 131.0(L)      CMP     Component Value Date/Time   NA 139 05/20/2021 1337   NA 138 07/15/2014 0515   K 3.7 06/10/2021 1328   K 3.5 01/16/2015 1429   CL 101 05/20/2021 1337   CL 110 (H) 07/15/2014 0515   CO2 26 05/20/2021 1337   CO2 24 07/15/2014 0515   GLUCOSE 161 (H) 05/20/2021 1337   GLUCOSE 145 (H) 07/15/2014 0515   BUN 19 05/20/2021 1337   BUN 17 07/15/2014 0515   CREATININE 1.46 05/20/2021 1337   CREATININE 0.89 01/16/2015 1429   CALCIUM 8.1 (L) 05/20/2021 1337   CALCIUM 7.1 (L) 07/15/2014 0515   PROT 6.4 04/29/2021 1605   PROT 6.2 (L) 10/22/2014 1000   ALBUMIN 3.8 04/29/2021 1605   ALBUMIN 2.6 (L) 10/22/2014 1000   AST 8 04/29/2021 1605   AST 19 10/22/2014 1000   ALT 4 04/29/2021 1605   ALT 21 10/22/2014 1000   ALKPHOS 107 04/29/2021 1605   ALKPHOS 118 (H) 10/22/2014 1000   BILITOT 0.5 04/29/2021 1605   BILITOT 0.8 10/22/2014 1000   GFRNONAA >60 01/24/2021 0830   GFRNONAA >60 01/16/2015 1429   GFRAA 41 (L)  01/24/2020 0925   GFRAA >60 01/16/2015  1429     No results found.     Assessment & Plan:   1. Lymphedema The patient previously been doing very well with control of his swelling with the use of Unna wraps.  However after he recently pulled down his wraps due to discomfort and the swelling is caused ulcerations as well as worsening swelling in his right leg.  We will continue to do his wraps on a weekly basis follow-up in 4 weeks.  2. Essential hypertension, benign Continue antihypertensive medications as already ordered, these medications have been reviewed and there are no changes at this time.   3. Type 2 diabetes mellitus with diabetic peripheral angiopathy without gangrene, without long-term current use of insulin (HCC) Continue hypoglycemic medications as already ordered, these medications have been reviewed and there are no changes at this time.  Hgb A1C to be monitored as already arranged by primary service    Current Outpatient Medications on File Prior to Visit  Medication Sig Dispense Refill  . carvedilol (COREG) 3.125 MG tablet Take 1 tablet by mouth 2 (two) times daily with a meal.    . Cholecalciferol (VITAMIN D3) 50 MCG (2000 UT) CAPS Take by mouth.    . ferrous sulfate 325 (65 FE) MG tablet Take 325 mg by mouth daily with breakfast.    . furosemide (LASIX) 40 MG tablet Take 40 mg by mouth 2 (two) times daily. 2 tablets in the morning 1 tablet in the afternoon as needed    . glucose 4 GM chewable tablet Chew 1 tablet (4 g total) by mouth once as needed for low blood sugar. 50 tablet 0  . insulin aspart (NOVOLOG) 100 UNIT/ML injection Basal rate 12am 0.8 units per hour, 9am 0.9 units per hour. (total basal insulin 20.7 units). Carbohydrate ratio 1 units for every 8gm of carbohydrate. Correction factor 1 units for every 50mg /dl over target cbg. Target CBG 80-120 1 vial 12  . levothyroxine (SYNTHROID) 25 MCG tablet Take 25 mcg by mouth daily before breakfast.    . nystatin  cream (MYCOSTATIN) APPLY TO AFFECTED AREA TWICE A DAY 30 g 1  . potassium chloride (KLOR-CON) 10 MEQ tablet TAKE 1 TABLET BY MOUTH TWICE DAILY FOR 3 DAYS THEN CONTINUE ONCE DAILY 30 tablet 0  . pravastatin (PRAVACHOL) 40 MG tablet Take 40 mg by mouth daily.     . vitamin B-12 (CYANOCOBALAMIN) 1000 MCG tablet Take 1,000 mcg by mouth daily.     Current Facility-Administered Medications on File Prior to Visit  Medication Dose Route Frequency Provider Last Rate Last Admin  . heparin lock flush 100 unit/mL  500 Units Intravenous Once Lloyd Huger, MD      . sodium chloride flush (NS) 0.9 % injection 10 mL  10 mL Intravenous PRN Lloyd Huger, MD   10 mL at 02/01/18 0829  . sodium chloride flush (NS) 0.9 % injection 10 mL  10 mL Intravenous PRN Lloyd Huger, MD   10 mL at 04/05/18 0800    There are no Patient Instructions on file for this visit. No follow-ups on file.   Kris Hartmann, NP

## 2021-07-28 ENCOUNTER — Telehealth: Payer: Medicare Other

## 2021-07-31 ENCOUNTER — Ambulatory Visit (INDEPENDENT_AMBULATORY_CARE_PROVIDER_SITE_OTHER): Payer: Medicare Other | Admitting: Nurse Practitioner

## 2021-07-31 ENCOUNTER — Encounter (INDEPENDENT_AMBULATORY_CARE_PROVIDER_SITE_OTHER): Payer: Self-pay | Admitting: Nurse Practitioner

## 2021-07-31 ENCOUNTER — Other Ambulatory Visit: Payer: Self-pay

## 2021-07-31 VITALS — BP 127/69 | HR 69 | Resp 16 | Ht 70.0 in | Wt 231.0 lb

## 2021-07-31 DIAGNOSIS — I890 Lymphedema, not elsewhere classified: Secondary | ICD-10-CM

## 2021-07-31 NOTE — Progress Notes (Signed)
History of Present Illness  There is no documented history at this time  Assessments & Plan   There are no diagnoses linked to this encounter.    Additional instructions  Subjective:  Patient presents with venous ulcer of the Bilateral lower extremity.    Procedure:  3 layer unna wrap was placed Bilateral lower extremity.   Plan:   Follow up in one week.  

## 2021-08-01 ENCOUNTER — Ambulatory Visit
Admission: RE | Admit: 2021-08-01 | Discharge: 2021-08-01 | Disposition: A | Discharge status: Home / Self Care | Payer: Medicare Other | Source: Ambulatory Visit | Attending: Oncology | Admitting: Oncology

## 2021-08-01 ENCOUNTER — Other Ambulatory Visit: Payer: Self-pay

## 2021-08-01 ENCOUNTER — Encounter (INDEPENDENT_AMBULATORY_CARE_PROVIDER_SITE_OTHER): Payer: Self-pay | Admitting: Nurse Practitioner

## 2021-08-01 DIAGNOSIS — K449 Diaphragmatic hernia without obstruction or gangrene: Secondary | ICD-10-CM | POA: Diagnosis not present

## 2021-08-01 DIAGNOSIS — J439 Emphysema, unspecified: Secondary | ICD-10-CM | POA: Diagnosis not present

## 2021-08-01 DIAGNOSIS — N3289 Other specified disorders of bladder: Secondary | ICD-10-CM | POA: Diagnosis not present

## 2021-08-01 DIAGNOSIS — Z8572 Personal history of non-Hodgkin lymphomas: Secondary | ICD-10-CM | POA: Diagnosis not present

## 2021-08-01 DIAGNOSIS — C8318 Mantle cell lymphoma, lymph nodes of multiple sites: Secondary | ICD-10-CM | POA: Insufficient documentation

## 2021-08-01 DIAGNOSIS — N281 Cyst of kidney, acquired: Secondary | ICD-10-CM | POA: Diagnosis not present

## 2021-08-01 DIAGNOSIS — S2249XA Multiple fractures of ribs, unspecified side, initial encounter for closed fracture: Secondary | ICD-10-CM | POA: Diagnosis not present

## 2021-08-01 LAB — POCT I-STAT CREATININE: Creatinine, Ser: 1.2 mg/dL (ref 0.61–1.24)

## 2021-08-01 MED ORDER — IOHEXOL 300 MG/ML  SOLN
100.0000 mL | Freq: Once | INTRAMUSCULAR | Status: AC | PRN
  Administered 2021-08-01: 100 mL via INTRAVENOUS

## 2021-08-01 NOTE — Progress Notes (Signed)
[place]  [phone] [phone]) [phone]  ID: [name] OB: 01-Jun-1946  MR#: [id number]  DEY#:[id number]  Patient Care Team: [name], MD as PCP - General (Internal Medicine) Lloyd Huger, MD as Consulting Physician (Oncology) Lucky Cowboy, Erskine Squibb, MD as Referring Physician (Vascular Surgery) Schnier, Dolores Lory, MD (Vascular Surgery) Corey Skains, MD as Consulting Physician (Cardiology) De Hollingshead, RPH-[name] (Pharmacist) Leona Singleton, RN as Case Manager Saporito, Maree Erie, LCSW as Social Worker (Licensed Clinical Social Worker)  CHIEF COMPLAINT: Mantle cell lymphoma.  INTERVAL HISTORY: Patient returns to clinic today for routine 44-month evaluation and discussion of his imaging results.  He continues to feel well and remains asymptomatic.  He does not complain of weakness or fatigue today.  He denies any fevers, chills, or night sweats.  He has no neurologic complaints.  He denies any chest pain, shortness of breath, cough, or hemoptysis.  He has no nausea, vomiting, constipation, or diarrhea.  He denies any melena or hematochezia.  He has no urinary complaints.  Patient feels at his baseline offers no specific complaints today.  Review of Systems  Constitutional: Negative.  Negative for diaphoresis, fever, malaise/fatigue and weight loss.  Respiratory: Negative.  Negative for cough, hemoptysis and shortness of breath.   Cardiovascular: Negative.  Negative for chest pain and leg swelling.  Gastrointestinal: Negative.  Negative for abdominal pain.  Genitourinary: Negative.  Negative for dysuria.  Musculoskeletal: Negative.  Negative for back pain.  Skin: Negative.  Negative for rash.  Neurological: Negative.  Negative for dizziness, focal weakness, weakness and headaches.  Psychiatric/Behavioral: Negative.  The patient is not nervous/anxious.    As per HPI. Otherwise, a complete review of systems is negative.  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 with macular edema    Gastroparesis    Graves disease    Hypercholesterolemia    Hypertension    Mantle cell lymphoma ([place]) 07/2014   Peripheral neuropathy    Shingles     PAST SURGICAL HISTORY: Past Surgical History:  Procedure Laterality Date   CHOLECYSTECTOMY     NEPHRECTOMY     right   PACEMAKER INSERTION N/A 12/07/2017   Procedure: INSERTION PACEMAKER DUAL CHAMBER INITIAL INSERT;  Surgeon: Isaias Cowman, MD;  Location: [facility];  Service: Cardiovascular;  Laterality: N/A;   PORTA CATH INSERTION N/A 11/03/2017   Procedure: PORTA CATH INSERTION;  Surgeon: Katha Cabal, MD;  Location: [facility];  Service: Cardiovascular;  Laterality: N/A;    FAMILY HISTORY Family History  Problem Relation Age of Onset   Breast cancer Mother    Diabetes Father    Cancer Father        Lymphoma   Alcohol abuse Maternal Uncle    Diabetes Sister    Heart disease Other        grandfather       ADVANCED DIRECTIVES:    HEALTH MAINTENANCE: Social History   Tobacco Use   Smoking status: Former   Smokeless tobacco: Current    Types: Chew  Vaping Use   Vaping Use: Never used  Substance Use Topics   Alcohol use: Yes    Alcohol/week: 3.0 standard drinks    Types: 3 Cans of beer per week    Comment: nightly   Drug use: Never     Colonoscopy:  PAP:  Bone density:  Lipid panel:  Allergies  Allergen Reactions   Rofecoxib Nausea Only    Current Outpatient Medications  Medication Sig Dispense Refill   carvedilol (COREG) 3.125 MG tablet Take 1 tablet by mouth 2 (two) times daily with a meal.     Cholecalciferol (VITAMIN D3) 50 MCG (2000 UT) CAPS Take by mouth.     ferrous sulfate 325 (65 FE) MG tablet Take 325 mg by mouth daily with breakfast.     furosemide (LASIX) 40 MG tablet Take  40 mg by mouth 2 (two) times daily. 2 tablets in the morning 1 tablet in the afternoon as needed     glucose 4 GM chewable tablet Chew 1 tablet (4 g total) by mouth once as needed for low blood sugar. 50 tablet 0   insulin aspart (NOVOLOG) 100 UNIT/ML injection Basal rate 12am 0.8 units per hour, 9am 0.9 units per hour. (total basal insulin 20.7 units). Carbohydrate ratio 1 units for every 8gm of carbohydrate. Correction factor 1 units for every 50mg /dl over target cbg. Target CBG 80-120 1 vial 12   levothyroxine (SYNTHROID) 25 MCG tablet Take 25 mcg by mouth daily before breakfast.     nystatin cream (MYCOSTATIN) APPLY TO AFFECTED AREA TWICE A DAY 30 g 1   potassium chloride (KLOR-CON) 10 MEQ tablet TAKE 1 TABLET BY MOUTH TWICE DAILY FOR 3 DAYS THEN CONTINUE ONCE DAILY 30 tablet 0   pravastatin (PRAVACHOL) 40 MG tablet Take 40 mg by mouth daily.      vitamin B-12 (CYANOCOBALAMIN) 1000 MCG tablet Take 1,000 mcg by mouth daily.     No current facility-administered medications for this visit.   Facility-Administered Medications Ordered in Other Visits  Medication Dose Route Frequency Provider Last Rate Last Admin   heparin lock flush 100 unit/mL  500 Units Intravenous Once Lloyd Huger, MD       sodium chloride flush (NS) 0.9 % injection 10 mL  10 mL Intravenous PRN Lloyd Huger, MD   10 mL at 02/01/18 0829   sodium chloride flush (NS) 0.9 % injection 10 mL  10 mL Intravenous PRN Lloyd Huger, MD   10 mL at 04/05/18 0800    OBJECTIVE: Vitals:   08/07/21 1013  BP: (!) 117/59  Pulse: 79  Resp: 16  Temp: 97.9 F (36.6 C)  SpO2: 97%     Body mass index is 32.87 kg/m.    ECOG FS:0 - Asymptomatic  General: Well-developed, well-nourished, no acute distress. Eyes: Pink conjunctiva, anicteric sclera. HEENT: Normocephalic, moist mucous membranes. Lungs: No audible wheezing or coughing. Heart: Regular rate and rhythm. Abdomen: Soft, nontender, no obvious  distention. Musculoskeletal: No edema, cyanosis, or clubbing. Neuro: Alert, answering all questions appropriately. Cranial nerves grossly intact. Skin: No rashes or petechiae noted. Psych: Normal affect. Lymphatics: No palpable lymphadenopathy.  LAB RESULTS:  Lab Results  Component Value Date   NA 135 08/07/2021   K 3.3 (L) 08/07/2021   CL 101 08/07/2021   CO2 23 08/07/2021   GLUCOSE 171 (H) 08/07/2021   BUN 29 (H) 08/07/2021   CREATININE 1.28 (H) 08/07/2021   CALCIUM 7.9 (L) 08/07/2021   PROT 6.9 08/07/2021   ALBUMIN 3.8 08/07/2021   AST 14 (L) 08/07/2021   ALT 9 08/07/2021   ALKPHOS 102 08/07/2021   BILITOT 0.9 08/07/2021   GFRNONAA 59 (L) 08/07/2021   GFRAA 41 (L) 01/24/2020    Lab Results  Component Value Date   WBC 7.1 08/07/2021  NEUTROABS 5.2 08/07/2021   HGB 13.5 08/07/2021   HCT 40.2 08/07/2021   MCV 90.3 08/07/2021   PLT 127 (L) 08/07/2021   Lab Results  Component Value Date   IRON 13 (L) 08/11/2017   TIBC 223 (L) 01/12/2017   IRONPCTSAT 3.8 (L) 08/11/2017   Lab Results  Component Value Date   FERRITIN 42.0 08/11/2017     STUDIES:   ASSESSMENT: Low-grade mantle cell lymphoma diagnosed in September 2015, iron deficiency anemia.  PLAN:    1. Mantle cell lymphoma, stage III: Patient's initial diagnosis was on inguinal lymph node biopsy on July 12, 2014.  Patient was noted to have progressive disease and underwent chemotherapy using Rituxan and Treanda completing cycle 4 on Mar 09, 2018.  His most recent imaging with CT scan on August 01, 2021 reviewed independently with no obvious evidence of recurrent or progressive disease.  Patient is now greater than 3 years removed from his treatments and can be monitored with CT scans and laboratory work on a yearly basis.  Return to clinic in 1 year for further evaluation and discussion of his results.   2. Decreased ejection fraction: Chronic and unchanged.  Previously patient had an ejection fraction of  40% with no clinical CHF.  Continue monitoring and evaluation per cardiology. 3. Diabetes: Blood glucose mildly elevated today.  Patient has improved blood glucose control.  Continue insulin pump as directed. 4. Thrombocytopenia: Chronic and unchanged.  Patient's platelet count is 127 today.  Chronic and unchanged. 5.  Bradycardia: Patient has pacemaker in place. 6.  Renal insufficiency: Chronic and unchanged.  Patient's creatinine is 1.28 today.  Continue follow-up with nephrology as scheduled.  Patient expressed understanding and was in agreement with this plan. He also understands that He can call clinic at any time with any questions, concerns, or complaints.    Lloyd Huger, MD   08/07/2021 10:49 AM

## 2021-08-02 ENCOUNTER — Other Ambulatory Visit: Payer: Self-pay | Admitting: Internal Medicine

## 2021-08-07 ENCOUNTER — Encounter (INDEPENDENT_AMBULATORY_CARE_PROVIDER_SITE_OTHER): Payer: Self-pay

## 2021-08-07 ENCOUNTER — Other Ambulatory Visit: Payer: Self-pay

## 2021-08-07 ENCOUNTER — Ambulatory Visit (INDEPENDENT_AMBULATORY_CARE_PROVIDER_SITE_OTHER): Payer: Medicare Other | Admitting: Nurse Practitioner

## 2021-08-07 ENCOUNTER — Inpatient Hospital Stay (HOSPITAL_BASED_OUTPATIENT_CLINIC_OR_DEPARTMENT_OTHER): Payer: Medicare Other | Admitting: Oncology

## 2021-08-07 ENCOUNTER — Inpatient Hospital Stay: Payer: Medicare Other | Attending: Oncology

## 2021-08-07 VITALS — BP 114/72 | HR 77 | Resp 16 | Wt 229.8 lb

## 2021-08-07 VITALS — BP 117/59 | HR 79 | Temp 97.9°F | Resp 16 | Wt 229.1 lb

## 2021-08-07 DIAGNOSIS — C8318 Mantle cell lymphoma, lymph nodes of multiple sites: Secondary | ICD-10-CM

## 2021-08-07 DIAGNOSIS — I110 Hypertensive heart disease with heart failure: Secondary | ICD-10-CM | POA: Insufficient documentation

## 2021-08-07 DIAGNOSIS — I890 Lymphedema, not elsewhere classified: Secondary | ICD-10-CM | POA: Diagnosis not present

## 2021-08-07 DIAGNOSIS — I509 Heart failure, unspecified: Secondary | ICD-10-CM | POA: Diagnosis not present

## 2021-08-07 DIAGNOSIS — Z794 Long term (current) use of insulin: Secondary | ICD-10-CM | POA: Diagnosis not present

## 2021-08-07 DIAGNOSIS — N289 Disorder of kidney and ureter, unspecified: Secondary | ICD-10-CM | POA: Insufficient documentation

## 2021-08-07 DIAGNOSIS — Z87891 Personal history of nicotine dependence: Secondary | ICD-10-CM | POA: Diagnosis not present

## 2021-08-07 DIAGNOSIS — Z803 Family history of malignant neoplasm of breast: Secondary | ICD-10-CM | POA: Insufficient documentation

## 2021-08-07 DIAGNOSIS — Z9641 Presence of insulin pump (external) (internal): Secondary | ICD-10-CM | POA: Insufficient documentation

## 2021-08-07 DIAGNOSIS — R001 Bradycardia, unspecified: Secondary | ICD-10-CM | POA: Insufficient documentation

## 2021-08-07 DIAGNOSIS — C8310 Mantle cell lymphoma, unspecified site: Secondary | ICD-10-CM | POA: Diagnosis not present

## 2021-08-07 DIAGNOSIS — E1142 Type 2 diabetes mellitus with diabetic polyneuropathy: Secondary | ICD-10-CM | POA: Insufficient documentation

## 2021-08-07 LAB — COMPREHENSIVE METABOLIC PANEL
ALT: 9 U/L (ref 0–44)
AST: 14 U/L — ABNORMAL LOW (ref 15–41)
Albumin: 3.8 g/dL (ref 3.5–5.0)
Alkaline Phosphatase: 102 U/L (ref 38–126)
Anion gap: 11 (ref 5–15)
BUN: 29 mg/dL — ABNORMAL HIGH (ref 8–23)
CO2: 23 mmol/L (ref 22–32)
Calcium: 7.9 mg/dL — ABNORMAL LOW (ref 8.9–10.3)
Chloride: 101 mmol/L (ref 98–111)
Creatinine, Ser: 1.28 mg/dL — ABNORMAL HIGH (ref 0.61–1.24)
GFR, Estimated: 59 mL/min — ABNORMAL LOW (ref >60)
Glucose, Bld: 171 mg/dL — ABNORMAL HIGH (ref 70–99)
Potassium: 3.3 mmol/L — ABNORMAL LOW (ref 3.5–5.1)
Sodium: 135 mmol/L (ref 135–145)
Total Bilirubin: 0.9 mg/dL (ref 0.3–1.2)
Total Protein: 6.9 g/dL (ref 6.5–8.1)

## 2021-08-07 LAB — CBC WITH DIFFERENTIAL/PLATELET
Abs Immature Granulocytes: 0.03 10*3/uL (ref 0.00–0.07)
Basophils Absolute: 0.1 10*3/uL (ref 0.0–0.1)
Basophils Relative: 1 %
Eosinophils Absolute: 0.2 10*3/uL (ref 0.0–0.5)
Eosinophils Relative: 3 %
HCT: 40.2 % (ref 39.0–52.0)
Hemoglobin: 13.5 g/dL (ref 13.0–17.0)
Immature Granulocytes: 0 %
Lymphocytes Relative: 14 %
Lymphs Abs: 1 10*3/uL (ref 0.7–4.0)
MCH: 30.3 pg (ref 26.0–34.0)
MCHC: 33.6 g/dL (ref 30.0–36.0)
MCV: 90.3 fL (ref 80.0–100.0)
Monocytes Absolute: 0.6 10*3/uL (ref 0.1–1.0)
Monocytes Relative: 9 %
Neutro Abs: 5.2 10*3/uL (ref 1.7–7.7)
Neutrophils Relative %: 73 %
Platelets: 127 10*3/uL — ABNORMAL LOW (ref 150–400)
RBC: 4.45 MIL/uL (ref 4.22–5.81)
RDW: 13.4 % (ref 11.5–15.5)
WBC: 7.1 10*3/uL (ref 4.0–10.5)
nRBC: 0 % (ref 0.0–0.2)

## 2021-08-07 MED ORDER — SODIUM CHLORIDE 0.9% FLUSH
10.0000 mL | INTRAVENOUS | Status: DC | PRN
  Administered 2021-08-07: 10 mL via INTRAVENOUS
  Filled 2021-08-07: qty 10

## 2021-08-07 MED ORDER — HEPARIN SOD (PORK) LOCK FLUSH 100 UNIT/ML IV SOLN
INTRAVENOUS | Status: AC
  Administered 2021-08-07: 500 [IU] via INTRAVENOUS
  Filled 2021-08-07: qty 5

## 2021-08-07 MED ORDER — HEPARIN SOD (PORK) LOCK FLUSH 100 UNIT/ML IV SOLN
500.0000 [IU] | Freq: Once | INTRAVENOUS | Status: AC
  Filled 2021-08-07: qty 5

## 2021-08-07 NOTE — Progress Notes (Signed)
History of Present Illness  There is no documented history at this time  Assessments & Plan   There are no diagnoses linked to this encounter.    Additional instructions  Subjective:  Patient presents with venous ulcer of the Bilateral lower extremity.    Procedure:  3 layer unna wrap was placed Bilateral lower extremity.   Plan:   Follow up in one week.  

## 2021-08-07 NOTE — Progress Notes (Signed)
Pt has no concerns/complaints at this time. 

## 2021-08-08 ENCOUNTER — Ambulatory Visit (INDEPENDENT_AMBULATORY_CARE_PROVIDER_SITE_OTHER): Payer: Medicare Other | Admitting: *Deleted

## 2021-08-08 DIAGNOSIS — I5022 Chronic systolic (congestive) heart failure: Secondary | ICD-10-CM

## 2021-08-08 DIAGNOSIS — E1151 Type 2 diabetes mellitus with diabetic peripheral angiopathy without gangrene: Secondary | ICD-10-CM

## 2021-08-10 NOTE — Patient Instructions (Signed)
Visit Information  PATIENT GOALS:  Goals Addressed             This Visit's Progress    (RNCM) Monitor and Manage My Blood Sugar-Diabetes Type 1   On track    Timeframe:  Long-Range Goal Priority:  High Start Date:   12/06/20                          Expected End Date:  12/09/21                    Follow Up Date 09/10/2021    Check blood sugar at least 5 times a day Check blood sugar if I feel it is too high or too low Take the blood sugar meter and log to all doctor visits  Consider drinking Boost or other supplement in the evenings if not eating a complete meal Make sure to have candy or glucose tablets on you at all times; try drinking coke along with eating snack to treat hypoglycemia Eat at least 3 meals a day with snack in between to help prevent hypoglycemia Please consider Home Health Nursing/Therapy involvement Fall precautions and preventions; Wear life alert at all times Contact Endocrinology for sustained hypoglycemia   Why is this important?   Checking your blood sugar at home helps to keep it from getting very high or very low.  Writing the results in a diary or log helps the doctor know how to care for you.  Your blood sugar log should have the time, the date and the results.  Also, write down the amount of insulin or other medicine you take.  Other information like what you ate, exercise done and how you were feeling will also be helpful..     Notes:      (RNCM) Track and Manage Fluids and Swelling-Heart Failure   On track    Timeframe:  Long-Range Goal Priority:  Medium Start Date:   12/06/20                          Expected End Date:  12/09/21                    Follow Up Date 09/10/21   Call office if I gain more than 2 pounds in one day or 5 pounds in one week Use salt in moderation, low salt heart healthy diabetic diet Weigh myself daily and write in log for provider review (at least start to weigh a few times a week working way up to daily) Take  medications as prescribed Place walker in front of scale to help balance self while standing to weigh (please consider trying this to weigh yourself)   Why is this important?   It is important to check your weight daily and watch how much salt and liquids you have.  It will help you to manage your heart failure.    Notes:         Patient verbalizes understanding of instructions provided today and agrees to view in [place].   The care management team will reach out to the patient again over the next 30 days.   [name] RN, MSN RN Care Management Coordinator [place] [phone] [email or web address]

## 2021-08-10 NOTE — Chronic Care Management (AMB) (Signed)
Chronic Care Management   CCM RN Visit Note  08/10/2021 Name: Gary [name] MRN: [id number] DOB: January 01, 1946  Subjective: Gary [name] is a 75 y.o. year old male who is a primary care patient of [name], MD. The care management team was consulted for assistance with disease management and care coordination needs.    Engaged with patient by telephone for follow up visit in response to provider referral for case management and/or care coordination services.   Consent to Services:  The patient was given information about Chronic Care Management services, agreed to services, and gave verbal consent prior to initiation of services.  Please see initial visit note for detailed documentation.   Patient agreed to services and verbal consent obtained.   Assessment: Review of patient past medical history, allergies, medications, health status, including review of consultants reports, laboratory and other test data, was performed as part of comprehensive evaluation and provision of chronic care management services.   SDOH (Social Determinants of Health) assessments and interventions performed:    CCM Care Plan  Allergies  Allergen Reactions   Rofecoxib Nausea Only    Outpatient Encounter Medications as of 08/08/2021  Medication Sig   carvedilol (COREG) 3.125 MG tablet Take 1 tablet by mouth 2 (two) times daily with a meal.   Cholecalciferol (VITAMIN D3) 50 MCG (2000 UT) CAPS Take by mouth.   ferrous sulfate 325 (65 FE) MG tablet Take 325 mg by mouth daily with breakfast.   furosemide (LASIX) 40 MG tablet Take 40 mg by mouth 2 (two) times daily. 2 tablets in the morning 1 tablet in the afternoon as needed   glucose 4 GM chewable tablet Chew 1 tablet (4 g total) by mouth once as needed for low blood sugar.   insulin aspart (NOVOLOG) 100 UNIT/ML injection Basal rate 12am 0.8 units per hour, 9am 0.9 units per hour. (total basal insulin 20.7 units). Carbohydrate ratio 1 units for every 8gm of  carbohydrate. Correction factor 1 units for every 50mg /dl over target cbg. Target CBG 80-120   levothyroxine (SYNTHROID) 25 MCG tablet Take 25 mcg by mouth daily before breakfast.   nystatin cream (MYCOSTATIN) APPLY TO AFFECTED AREA TWICE A DAY   potassium chloride (KLOR-CON) 10 MEQ tablet TAKE 1 TABLET BY MOUTH TWICE DAILY FOR 3 DAYS THEN CONTINUE ONCE DAILY   pravastatin (PRAVACHOL) 40 MG tablet Take 40 mg by mouth daily.    vitamin B-12 (CYANOCOBALAMIN) 1000 MCG tablet Take 1,000 mcg by mouth daily.   Facility-Administered Encounter Medications as of 08/08/2021  Medication   heparin lock flush 100 unit/mL   sodium chloride flush (NS) 0.9 % injection 10 mL   sodium chloride flush (NS) 0.9 % injection 10 mL    Patient Active Problem List   Diagnosis Date Noted   Porokeratosis 05/15/2021   Aortic atherosclerosis ([place]) 05/05/2021   Emphysema lung ([place]) 05/05/2021   Thrombocytopenia ([place]) 04/29/2021   Splinter of foot without infection 02/23/2021   Arm laceration 02/09/2021   Hypokalemia 10/24/2020   Injury by nail 08/15/2020   Shingles 07/24/2020   Rash 07/02/2020   Colon cancer screening 07/02/2020   Sick sinus syndrome ([place]) 12/28/2019   Acquired absence of kidney 09/13/2019   Edema of lower extremity 09/13/2019   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, or unspecified 09/13/2019   Proteinuria 09/13/2019   Lower extremity pain, bilateral    Diabetic foot ulcer associated with type 1 diabetes mellitus ([place])  Acute on chronic systolic CHF (congestive heart failure) ([place])    Cellulitis 08/18/2019   Lower limb ulcer, calf ([place]) 08/01/2019   Left hip pain 07/16/2019   Pain due to onychomycosis of toenails of both feet 04/03/2019   Swelling of limb 02/07/2019   Pancytopenia ([place]) 12/03/2018   CKD (chronic kidney disease) stage 3, GFR 30-59 ml/min (HCC) 09/16/2018   Left shoulder pain 09/16/2018   Chronic cholecystitis  12/29/2017   Graves disease 12/29/2017   Peripheral neuropathy 12/29/2017   S/p nephrectomy 12/29/2017   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   Symptomatic bradycardia 12/02/2017   AKI (acute kidney injury) ([place]) 78/46/9629   Chronic systolic CHF (congestive heart failure) ([place]) 12/02/2017   Saturday night paralysis 11/12/2017   Hypoglycemia 10/28/2017   Goals of care, counseling/discussion 10/24/2017   Lymphedema 10/20/2017   Iron deficiency anemia 09/11/2017   Fall 08/13/2017   Humerus fracture 08/13/2017   Anemia 01/12/2017   Bilateral carotid artery stenosis 06/29/2016   Hand laceration 12/16/2015   Adjustment disorder with depressed mood 08/11/2015   Cardiomyopathy, idiopathic ([place]) 04/22/2015   CAD in native artery 11/02/2014   Mantle cell lymphoma ([place]) 08/03/2014   History of colonic polyps 04/29/2014   Irregular heart beat 04/22/2014   SOB (shortness of breath) 04/22/2014   Stress 03/21/2014   PVC (premature ventricular contraction) 02/21/2014   GERD (gastroesophageal reflux disease) 10/23/2013   Gastroparesis 06/25/2013   Chest pain 04/13/2013   Thyroid disease 04/13/2013   Diabetes ([place]) 04/13/2013   Essential hypertension, benign 04/13/2013   Hypercholesterolemia 04/13/2013    Conditions to be addressed/monitored:CHF and DMII  Care Plan : Heart Failure (Adult)  Updates made by Leona Singleton, RN since 08/10/2021 12:00 AM     Problem: Disease Progression (Heart Failure)   Priority: Medium     Long-Range Goal: Patient will report no heart failure exacerbations in the next 90 days   Start Date: 12/06/2020  Expected End Date: 10/10/2021  This Visit's Progress: On track  Recent Progress: On track  Priority: Medium  Note:   Current Barriers:  Knowledge deficit related to basic heart failure pathophysiology and self care management as evidenced by continued lower extremity edema requiring Unna Boots (changed every Thursday at Vascular office).  Patient  reports he has found his scale and is now weighing about every other day.  Weight today 229 pounds with recent ranges of 229-230.   Reports lower extremity edema is about the same (better than a year ago) and denies any increase in shortness of breath at this time.   Patient does not have transportation to provider appointments Transportation Barriers; going to use [facility] transportation for appointments within [facility] system and using [employer] for other medical appointments Lacks social connections Unable to perform IADLs independently Case Manager Clinical Goal(s):  patient will verbalize understanding of Heart Failure Action Plan and when to call doctor patient will take all Heart Failure mediations as prescribed patient will weigh daily and record (notifying MD of 3 lb weight gain over night or 5 lb in a week) Interventions:  Collaboration with [name], MD regarding development and update of comprehensive plan of care as evidenced by provider attestation and co-signature Inter-disciplinary care team collaboration (see longitudinal plan of care) Provided verbal education on low sodium diet Advised patient to weigh each morning after emptying bladder Discussed importance of daily weight and advised patient to weigh and record daily and encouraged patient to do so; Reinforced need to weight daily and encouraged  patient to use walker in front of scale to help balance while standing on scale Encouraged to increase activity as tolerated Congratulated patient on finding scale at home, discussed importance of daily weight monitoring, when to weigh, and when to call provider based on weight  Congratulated on finding walker so it can be used to stand on scale, also encouraged to use walker with ambulation Reviewed signs and symptoms of heart failure exacerbation Encouraged elevation of lower extremities as able Encouraged continued use of [facility] [employer] and [employer]  transportation Discussed medications and encouraged medication compliance Encouraged to use 2022 [place] previously sent to help with logging of weights and other vital signs Discussed importance of weighing at home and not just at providers office Patient Goals/Self-Care Activities Call office if I gain more than 2 pounds in one day or 5 pounds in one week Use salt in moderation, low salt heart healthy diabetic diet Weigh myself daily and write in log for provider review (at least start to weigh a few times a week working way up to daily) Take medications as prescribed Place walker in front of scale to help balance self while standing to weigh (please consider trying this to weigh yourself) Follow Up Plan: The care management team will reach out to the patient again over the next 30 business days.     Care Plan : Diabetes  Updates made by Leona Singleton, RN since 08/10/2021 12:00 AM     Problem: Hypoglycemia causing syncopal episodes   Priority: Medium     Long-Range Goal: Patient will report maintaing Hgb A1C of 7 or below in the next 90 days   Start Date: 01/27/2021  Expected End Date: 02/08/2022  This Visit's Progress: On track  Recent Progress: On track  Priority: Medium  Note:   Objective:  Lab Results  Component Value Date   HGBA1C 7.0 01/23/2021  Current Barriers:  Knowledge Deficits related to basic Diabetes pathophysiology and self care/management as evidenced by multiple syncopal episodes related to hypoglycemia.  Latest Hgb A1C 6.9 on 04/29/21.  Patient reports fasting blood sugar of 152 this morning with recent fasting ranges of 120-170.  States he has had to turn off insulin pump related to hypoglycemia in the last 2 weeks but denies any sustained significant hypoglycemia events.  Denies fall in the last 12 weeks. Limited Social Support Trouble getting insulin pump supplies-reports he now has insulin pump sensors Case Manager Clinical Goal(s):  patient  will demonstrate improved adherence to prescribed treatment plan for diabetes self care/management as evidenced by: at least 4 times a day monitoring and recording of CBG, adherence to ADA/ carb modified diet, adherence to prescribed medication regimen, contacting provider for new or worsened symptoms or questions Interventions:  Collaboration with [name], MD regarding development and update of comprehensive plan of care as evidenced by provider attestation and co-signature Inter-disciplinary care team collaboration (see longitudinal plan of care) Provided education to patient about basic DM disease process Discussed medications and encouraged medication compliance Discussed plans with patient for ongoing care management follow up and provided patient with direct contact information for care management team Encouraged continued participation with CCM Pharmacist  Reviewed glucose ranges and hypoglycemic events; and discussed dangers of hypoglycemia and treatment options, need for immediate sugar replacement with juice, sugar, soda in addition to snack or candy bar or glucose tablets Discussed proper treatment of hypoglycemia Reviewed scheduled/upcoming provider appointments including:   Discussed healthy meal and snack options Rediscussed possible  Home Health Nursing or Therapy referral for safety evaluation after 2 falls, educating and discussing even if they start services, patient can cancel at any time (patient still declines at this time) Discussed and encouraged patient to be proactive with health instead of reactive after falls Fall precautions and preventions reviewed and discussed, encouraged to use walker with all ambulation,  Instructed and encouraged to wear life alert at all times Discussed logging blood sugars and writing down insulin doses taken to help provider manage medications; sent 2022 Calendar Booklet to help with keeping log of blood sugars and insulin doses Reviewed  and discussed proper diabetic foot care, encouraged patient to keep check on right foot splinter wound for signs and symptoms of infection Discussed and encouraged and reinforced patient to wear bedroom/house/slides while in the home and not to walk around bare foot Discussed with patient asking podiatrist for prescription for diabetic shoes Encouraged to contact Endocrinology if he has to turn insulin pump off multiple times for hypoglycemia Patient Goals/Self-Care Activities Check blood sugar at least 5 times a day Check blood sugar if I feel it is too high or too low Take the blood sugar meter and log to all doctor visits  Consider drinking Boost or other supplement in the evenings if not eating a complete meal Make sure to have candy or glucose tablets on you at all times; try drinking coke along with eating snack to treat hypoglycemia Eat at least 3 meals a day with snack in between to help prevent hypoglycemia Please consider Home Health Nursing/Therapy involvement Fall precautions and preventions; Wear life alert at all times Contact Endocrinology for sustained hypoglycemia  Follow Up Plan: The care management team will reach out to the patient again over the next 30 business days.        Plan:The care management team will reach out to the patient again over the next 30 days.  [name] RN, MSN RN Care Management Coordinator [place] [phone] [email or web address]

## 2021-08-11 ENCOUNTER — Telehealth: Payer: Medicare Other

## 2021-08-11 DIAGNOSIS — E1151 Type 2 diabetes mellitus with diabetic peripheral angiopathy without gangrene: Secondary | ICD-10-CM | POA: Diagnosis not present

## 2021-08-11 DIAGNOSIS — I5022 Chronic systolic (congestive) heart failure: Secondary | ICD-10-CM | POA: Diagnosis not present

## 2021-08-12 DIAGNOSIS — E785 Hyperlipidemia, unspecified: Secondary | ICD-10-CM | POA: Diagnosis not present

## 2021-08-12 DIAGNOSIS — E1069 Type 1 diabetes mellitus with other specified complication: Secondary | ICD-10-CM | POA: Diagnosis not present

## 2021-08-12 DIAGNOSIS — E10649 Type 1 diabetes mellitus with hypoglycemia without coma: Secondary | ICD-10-CM | POA: Diagnosis not present

## 2021-08-12 DIAGNOSIS — E1159 Type 2 diabetes mellitus with other circulatory complications: Secondary | ICD-10-CM | POA: Diagnosis not present

## 2021-08-12 DIAGNOSIS — I152 Hypertension secondary to endocrine disorders: Secondary | ICD-10-CM | POA: Diagnosis not present

## 2021-08-14 ENCOUNTER — Ambulatory Visit (INDEPENDENT_AMBULATORY_CARE_PROVIDER_SITE_OTHER): Payer: Medicare Other | Admitting: Nurse Practitioner

## 2021-08-14 ENCOUNTER — Encounter (INDEPENDENT_AMBULATORY_CARE_PROVIDER_SITE_OTHER): Payer: Self-pay | Admitting: Nurse Practitioner

## 2021-08-14 ENCOUNTER — Other Ambulatory Visit: Payer: Self-pay

## 2021-08-14 VITALS — BP 117/63 | HR 73 | Resp 16 | Wt 231.4 lb

## 2021-08-14 DIAGNOSIS — I890 Lymphedema, not elsewhere classified: Secondary | ICD-10-CM | POA: Diagnosis not present

## 2021-08-14 NOTE — Progress Notes (Signed)
History of Present Illness  There is no documented history at this time  Assessments & Plan   There are no diagnoses linked to this encounter.    Additional instructions  Subjective:  Patient presents with venous ulcer of the Bilateral lower extremity.    Procedure:  3 layer unna wrap was placed Bilateral lower extremity.   Plan:   Follow up in one week.  

## 2021-08-16 ENCOUNTER — Other Ambulatory Visit: Payer: Self-pay | Admitting: Internal Medicine

## 2021-08-16 ENCOUNTER — Encounter (INDEPENDENT_AMBULATORY_CARE_PROVIDER_SITE_OTHER): Payer: Self-pay | Admitting: Nurse Practitioner

## 2021-08-21 ENCOUNTER — Encounter (INDEPENDENT_AMBULATORY_CARE_PROVIDER_SITE_OTHER): Payer: Self-pay | Admitting: Nurse Practitioner

## 2021-08-21 ENCOUNTER — Other Ambulatory Visit: Payer: Self-pay

## 2021-08-21 ENCOUNTER — Encounter: Payer: Self-pay | Admitting: Podiatry

## 2021-08-21 ENCOUNTER — Ambulatory Visit (INDEPENDENT_AMBULATORY_CARE_PROVIDER_SITE_OTHER): Payer: Medicare Other | Admitting: Nurse Practitioner

## 2021-08-21 ENCOUNTER — Ambulatory Visit (INDEPENDENT_AMBULATORY_CARE_PROVIDER_SITE_OTHER): Payer: Medicare Other | Admitting: Podiatry

## 2021-08-21 VITALS — BP 122/77 | HR 67 | Resp 16 | Wt 235.0 lb

## 2021-08-21 DIAGNOSIS — M79675 Pain in left toe(s): Secondary | ICD-10-CM

## 2021-08-21 DIAGNOSIS — N179 Acute kidney failure, unspecified: Secondary | ICD-10-CM

## 2021-08-21 DIAGNOSIS — B351 Tinea unguium: Secondary | ICD-10-CM

## 2021-08-21 DIAGNOSIS — I890 Lymphedema, not elsewhere classified: Secondary | ICD-10-CM

## 2021-08-21 DIAGNOSIS — M79674 Pain in right toe(s): Secondary | ICD-10-CM | POA: Diagnosis not present

## 2021-08-21 DIAGNOSIS — E1151 Type 2 diabetes mellitus with diabetic peripheral angiopathy without gangrene: Secondary | ICD-10-CM

## 2021-08-21 NOTE — Progress Notes (Signed)
History of Present Illness  There is no documented history at this time  Assessments & Plan   There are no diagnoses linked to this encounter.    Additional instructions  Subjective:  Patient presents with venous ulcer of the Bilateral lower extremity.    Procedure:  3 layer unna wrap was placed Bilateral lower extremity.   Plan:   Follow up in one week.  

## 2021-08-21 NOTE — Progress Notes (Signed)
This patient returns to my office for at risk foot care.  This patient requires this care by a professional since this patient will be at risk due to having chronic kidney disease and diabetes.  This patient is unable to cut nails himself since the patient cannot reach his nails.These nails are painful walking and wearing shoes. This patient is wearing unna boots on both legs/feet. This patient presents for at risk foot care today.  General Appearance  Alert, conversant and in no acute stress.  Vascular  Deferred due to unna boots.  Neurologic  Deferred due to unna boots.  Nails Thick disfigured discolored nails with subungual debris  from hallux to fifth toes bilaterally. No evidence of bacterial infection or drainage bilaterally.   Orthopedic  No limitations of motion  feet .  No crepitus or effusions noted.  No bony pathology or digital deformities noted.  Skin  normotropic skin with no porokeratosis noted bilaterally.  No signs of infections or ulcers noted.     Onychomycosis  Pain in right toes  Pain in left toes  Consent was obtained for treatment procedures.   Mechanical debridement of nails 1-5  bilaterally performed with a nail nipper.  Filed with dremel without incident.    Return office visit    3 months                 Told patient to return for periodic foot care and evaluation due to potential at risk complications.   [name] DPM

## 2021-08-25 ENCOUNTER — Emergency Department: Payer: Medicare Other

## 2021-08-25 ENCOUNTER — Encounter: Payer: Self-pay | Admitting: Internal Medicine

## 2021-08-25 ENCOUNTER — Other Ambulatory Visit: Payer: Self-pay

## 2021-08-25 ENCOUNTER — Emergency Department
Admission: EM | Admit: 2021-08-25 | Discharge: 2021-08-25 | Disposition: A | Discharge status: Home / Self Care | Payer: Medicare Other | Attending: Emergency Medicine | Admitting: Emergency Medicine

## 2021-08-25 ENCOUNTER — Encounter (INDEPENDENT_AMBULATORY_CARE_PROVIDER_SITE_OTHER): Payer: Self-pay | Admitting: Nurse Practitioner

## 2021-08-25 DIAGNOSIS — E1122 Type 2 diabetes mellitus with diabetic chronic kidney disease: Secondary | ICD-10-CM | POA: Diagnosis not present

## 2021-08-25 DIAGNOSIS — I2510 Atherosclerotic heart disease of native coronary artery without angina pectoris: Secondary | ICD-10-CM | POA: Diagnosis not present

## 2021-08-25 DIAGNOSIS — Z79899 Other long term (current) drug therapy: Secondary | ICD-10-CM | POA: Insufficient documentation

## 2021-08-25 DIAGNOSIS — Z87891 Personal history of nicotine dependence: Secondary | ICD-10-CM | POA: Diagnosis not present

## 2021-08-25 DIAGNOSIS — W01198A Fall on same level from slipping, tripping and stumbling with subsequent striking against other object, initial encounter: Secondary | ICD-10-CM | POA: Insufficient documentation

## 2021-08-25 DIAGNOSIS — Y9301 Activity, walking, marching and hiking: Secondary | ICD-10-CM | POA: Diagnosis not present

## 2021-08-25 DIAGNOSIS — Z950 Presence of cardiac pacemaker: Secondary | ICD-10-CM | POA: Insufficient documentation

## 2021-08-25 DIAGNOSIS — R600 Localized edema: Secondary | ICD-10-CM | POA: Diagnosis not present

## 2021-08-25 DIAGNOSIS — S61412A Laceration without foreign body of left hand, initial encounter: Secondary | ICD-10-CM | POA: Diagnosis not present

## 2021-08-25 DIAGNOSIS — M7981 Nontraumatic hematoma of soft tissue: Secondary | ICD-10-CM | POA: Diagnosis not present

## 2021-08-25 DIAGNOSIS — S0012XA Contusion of left eyelid and periocular area, initial encounter: Secondary | ICD-10-CM | POA: Diagnosis not present

## 2021-08-25 DIAGNOSIS — Z794 Long term (current) use of insulin: Secondary | ICD-10-CM | POA: Insufficient documentation

## 2021-08-25 DIAGNOSIS — R0789 Other chest pain: Secondary | ICD-10-CM | POA: Diagnosis not present

## 2021-08-25 DIAGNOSIS — S0083XA Contusion of other part of head, initial encounter: Secondary | ICD-10-CM | POA: Diagnosis not present

## 2021-08-25 DIAGNOSIS — S0003XA Contusion of scalp, initial encounter: Secondary | ICD-10-CM | POA: Diagnosis not present

## 2021-08-25 DIAGNOSIS — Z23 Encounter for immunization: Secondary | ICD-10-CM | POA: Insufficient documentation

## 2021-08-25 DIAGNOSIS — S199XXA Unspecified injury of neck, initial encounter: Secondary | ICD-10-CM | POA: Diagnosis not present

## 2021-08-25 DIAGNOSIS — I5022 Chronic systolic (congestive) heart failure: Secondary | ICD-10-CM | POA: Diagnosis not present

## 2021-08-25 DIAGNOSIS — H05232 Hemorrhage of left orbit: Secondary | ICD-10-CM

## 2021-08-25 DIAGNOSIS — M4802 Spinal stenosis, cervical region: Secondary | ICD-10-CM | POA: Diagnosis not present

## 2021-08-25 DIAGNOSIS — S0081XA Abrasion of other part of head, initial encounter: Secondary | ICD-10-CM | POA: Diagnosis not present

## 2021-08-25 DIAGNOSIS — M47812 Spondylosis without myelopathy or radiculopathy, cervical region: Secondary | ICD-10-CM | POA: Diagnosis not present

## 2021-08-25 DIAGNOSIS — S0512XA Contusion of eyeball and orbital tissues, left eye, initial encounter: Secondary | ICD-10-CM | POA: Diagnosis not present

## 2021-08-25 DIAGNOSIS — I517 Cardiomegaly: Secondary | ICD-10-CM | POA: Diagnosis not present

## 2021-08-25 DIAGNOSIS — M4312 Spondylolisthesis, cervical region: Secondary | ICD-10-CM | POA: Diagnosis not present

## 2021-08-25 DIAGNOSIS — S6991XA Unspecified injury of right wrist, hand and finger(s), initial encounter: Secondary | ICD-10-CM | POA: Diagnosis present

## 2021-08-25 DIAGNOSIS — N184 Chronic kidney disease, stage 4 (severe): Secondary | ICD-10-CM | POA: Insufficient documentation

## 2021-08-25 DIAGNOSIS — M79642 Pain in left hand: Secondary | ICD-10-CM | POA: Diagnosis not present

## 2021-08-25 DIAGNOSIS — S0990XA Unspecified injury of head, initial encounter: Secondary | ICD-10-CM | POA: Diagnosis not present

## 2021-08-25 DIAGNOSIS — J439 Emphysema, unspecified: Secondary | ICD-10-CM | POA: Diagnosis not present

## 2021-08-25 DIAGNOSIS — I130 Hypertensive heart and chronic kidney disease with heart failure and stage 1 through stage 4 chronic kidney disease, or unspecified chronic kidney disease: Secondary | ICD-10-CM | POA: Insufficient documentation

## 2021-08-25 DIAGNOSIS — W19XXXA Unspecified fall, initial encounter: Secondary | ICD-10-CM

## 2021-08-25 DIAGNOSIS — Z043 Encounter for examination and observation following other accident: Secondary | ICD-10-CM | POA: Diagnosis not present

## 2021-08-25 MED ORDER — TETANUS-DIPHTHERIA TOXOIDS TD 5-2 LFU IM INJ
0.5000 mL | INJECTION | Freq: Once | INTRAMUSCULAR | Status: AC
  Administered 2021-08-25: 0.5 mL via INTRAMUSCULAR
  Filled 2021-08-25: qty 0.5

## 2021-08-25 MED ORDER — LIDOCAINE-EPINEPHRINE 2 %-1:100000 IJ SOLN
20.0000 mL | Freq: Once | INTRAMUSCULAR | Status: AC
  Administered 2021-08-25: 20 mL

## 2021-08-25 NOTE — ED Triage Notes (Signed)
Pt comes with c/o fall. Pt states he lost his balance. Pt states fell forward hitting concrete driveway. Pt is on blood thinner.  Pt has large gooseegg to left eye, abrasion to left knuckles/ pt states pain to left knee.  Pt has black eye bilaterally. Pt has large wound to forehead, bandage in place and bleeding controlled.

## 2021-08-25 NOTE — ED Provider Notes (Signed)
Patient is  [facility]  ____________________________________________   Event Date/Time   First MD Initiated Contact with Patient 08/25/21 1412     (approximate)  I have reviewed the triage vital signs and the nursing notes.   HISTORY  Chief Complaint Fall   HPI Gary [name] is a 75 y.o. male with pmh CHF, diabetes, hypertension, mantle cell lymphoma who presents after a fall.  Patient was walking his dog when the dog got excited and was running around causing him to lose his balance.  He fell forward onto the concrete hitting his head.  Says he saw stars but did not lose consciousness.  He was able to crawl to the stairs and get up on his own.  He used his medical alert device to call for help but no one ever came.  He endorses facial pain as well as significant swelling of the left eyelid making it hard for him to see.  Versus left-sided chest wall pain but no difficulty breathing.  Denies abdominal pain.  He denies neck pain, numbness or weakness in his upper or lower extremities.  Patient tells me is on a blood thinner but cannot recall the name.  There is no anticoagulants on his medication list.         Past Medical History:  Diagnosis Date   CHF (congestive heart failure) ([place])    Depression    Diabetes mellitus due to underlying condition with diabetic retinopathy with macular edema    Gastroparesis    Graves disease    Hypercholesterolemia    Hypertension    Mantle cell lymphoma ([place]) 07/2014   Peripheral neuropathy    Shingles     Patient Active Problem List   Diagnosis Date Noted   Porokeratosis 05/15/2021   Aortic atherosclerosis ([place]) 05/05/2021   Emphysema lung ([place]) 05/05/2021   Thrombocytopenia ([place]) 04/29/2021   Splinter of foot without infection 02/23/2021   Arm laceration 02/09/2021   Hypokalemia 10/24/2020   Injury by nail 08/15/2020   Shingles 07/24/2020   Rash 07/02/2020   Colon cancer screening 07/02/2020   Sick sinus  syndrome ([place]) 12/28/2019   Acquired absence of kidney 09/13/2019   Edema of lower extremity 09/13/2019   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, or unspecified 09/13/2019   Proteinuria 09/13/2019   Lower extremity pain, bilateral    Diabetic foot ulcer associated with type 1 diabetes mellitus ([place])    Acute on chronic systolic CHF (congestive heart failure) ([place])    Cellulitis 08/18/2019   Lower limb ulcer, calf ([place]) 08/01/2019   Left hip pain 07/16/2019   Pain due to onychomycosis of toenails of both feet 04/03/2019   Swelling of limb 02/07/2019   Pancytopenia ([place]) 12/03/2018   CKD (chronic kidney disease) stage 3, GFR 30-59 ml/min (HCC) 09/16/2018   Left shoulder pain 09/16/2018   Chronic cholecystitis 12/29/2017   Graves disease 12/29/2017   Peripheral neuropathy 12/29/2017   S/p nephrectomy 12/29/2017   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   Symptomatic bradycardia 12/02/2017   AKI (acute kidney injury) ([place]) 36/14/4315   Chronic systolic CHF (congestive heart failure) ([place]) 12/02/2017   Saturday night paralysis 11/12/2017   Hypoglycemia 10/28/2017   Goals of care, counseling/discussion 10/24/2017   Lymphedema 10/20/2017   Iron deficiency anemia 09/11/2017   Fall 08/13/2017   Humerus fracture 08/13/2017   Anemia 01/12/2017   Bilateral carotid artery stenosis 06/29/2016   Hand laceration 12/16/2015   Adjustment disorder  with depressed mood 08/11/2015   Cardiomyopathy, idiopathic ([place]) 04/22/2015   CAD in native artery 11/02/2014   Mantle cell lymphoma ([place]) 08/03/2014   History of colonic polyps 04/29/2014   Irregular heart beat 04/22/2014   SOB (shortness of breath) 04/22/2014   Stress 03/21/2014   PVC (premature ventricular contraction) 02/21/2014   GERD (gastroesophageal reflux disease) 10/23/2013   Gastroparesis 06/25/2013   Chest pain 04/13/2013   Thyroid disease 04/13/2013   Diabetes ([place]) 04/13/2013    Essential hypertension, benign 04/13/2013   Hypercholesterolemia 04/13/2013    Past Surgical History:  Procedure Laterality Date   CHOLECYSTECTOMY     NEPHRECTOMY     right   PACEMAKER INSERTION N/A 12/07/2017   Procedure: INSERTION PACEMAKER DUAL CHAMBER INITIAL INSERT;  Surgeon: Isaias Cowman, MD;  Location: [facility] ORS;  Service: Cardiovascular;  Laterality: N/A;   PORTA CATH INSERTION N/A 11/03/2017   Procedure: PORTA CATH INSERTION;  Surgeon: Katha Cabal, MD;  Location: [facility];  Service: Cardiovascular;  Laterality: N/A;    Prior to Admission medications   Medication Sig Start Date End Date Taking? Authorizing Provider  carvedilol (COREG) 3.125 MG tablet Take 1 tablet by mouth 2 (two) times daily with a meal. 06/25/20   [provider]  Cholecalciferol (VITAMIN D3) 50 MCG (2000 UT) CAPS Take by mouth. 03/20/20   [provider]  ferrous sulfate 325 (65 FE) MG tablet Take 325 mg by mouth daily with breakfast.    [provider]  furosemide (LASIX) 40 MG tablet Take 40 mg by mouth 2 (two) times daily. 2 tablets in the morning 1 tablet in the afternoon as needed    [provider]  glucose 4 GM chewable tablet Chew 1 tablet (4 g total) by mouth once as needed for low blood sugar. 02/09/21   [name], MD  insulin aspart (NOVOLOG) 100 UNIT/ML injection Basal rate 12am 0.8 units per hour, 9am 0.9 units per hour. (total basal insulin 20.7 units). Carbohydrate ratio 1 units for every 8gm of carbohydrate. Correction factor 1 units for every 50mg /dl over target cbg. Target CBG 80-120 10/29/17   Loletha Grayer, MD  levothyroxine (SYNTHROID) 25 MCG tablet Take 25 mcg by mouth daily before breakfast.    [provider]  nystatin cream (MYCOSTATIN) APPLY TO AFFECTED AREA TWICE A DAY 07/15/21   [name], MD  potassium chloride (KLOR-CON) 10 MEQ tablet TAKE 1 TABLET BY MOUTH TWICE DAILY FOR 3 DAYS THEN CONTINUE ONCE DAILY  08/18/21   [name], MD  pravastatin (PRAVACHOL) 40 MG tablet Take 40 mg by mouth daily.  03/29/19   [provider]  vitamin B-12 (CYANOCOBALAMIN) 1000 MCG tablet Take 1,000 mcg by mouth daily.    [provider]    Allergies Rofecoxib  Family History  Problem Relation Age of Onset   Breast cancer Mother    Diabetes Father    Cancer Father        Lymphoma   Alcohol abuse Maternal Uncle    Diabetes Sister    Heart disease Other        grandfather    Social History Social History   Tobacco Use   Smoking status: Former   Smokeless tobacco: Current    Types: Nurse, children's Use: Never used  Substance Use Topics   Alcohol use: Yes    Alcohol/week: 3.0 standard drinks    Types: 3 Cans of beer per week    Comment:  nightly   Drug use: Never    Review of Systems   Review of Systems  HENT:  Positive for facial swelling.   Eyes:  Positive for pain and visual disturbance.  Respiratory:  Negative for shortness of breath.   Cardiovascular:  Positive for chest pain.  Musculoskeletal:  Positive for arthralgias and myalgias. Negative for neck pain.  Skin:  Positive for wound.  Neurological:  Negative for headaches.  All other systems reviewed and are negative.  Physical Exam Updated Vital Signs BP 138/66   Pulse 79   Temp 98 F (36.7 C)   Resp 17   Ht 5\' 10"  (1.778 m)   Wt 106.5 kg   SpO2 98%   BMI 33.69 kg/m   Physical Exam Vitals and nursing note reviewed.  Constitutional:      General: He is not in acute distress.    Appearance: Normal appearance.  HENT:     Head: Normocephalic.     Comments: Abrasion over the center of the forehead Significant bilateral ecchymosis, most significantly of the left upper eyelid that has a hematoma present in the lid to overlie the globe, however the lid is not taut  No significant swelling throughout the rest of the face or deformity No septal hematoma No trismus or malocclusion Eyes:      General: No scleral icterus.    Extraocular Movements: Extraocular movements intact.     Conjunctiva/sclera: Conjunctivae normal.     Pupils: Pupils are equal, round, and reactive to light.     Comments: No hyphema  Cardiovascular:     Rate and Rhythm: Normal rate and regular rhythm.  Pulmonary:     Effort: Pulmonary effort is normal. No respiratory distress.     Breath sounds: Normal breath sounds. No wheezing.  Abdominal:     General: Abdomen is flat. There is no distension.     Palpations: Abdomen is soft.     Tenderness: There is no abdominal tenderness. There is no guarding.  Musculoskeletal:        General: No deformity or signs of injury.     Cervical back: Normal range of motion. No tenderness.     Comments: No C, T or L-spine tenderness or ecchymosis No focal bony swelling or tenderness of the bilateral upper extremities Superficial laceration over the left second through fourth knuckles Left anterior lateral chest wall tenderness without crepitus or ecchymosis Abdomen is soft nontender Pelvis is stable nontender Significant swelling, deformity or tenderness to palpation of the bilateral lower extremities There is erythema and an abrasion over the left knee but no effusion and range of motion is intact as well as straight leg raise Lateral lower extremities are wrapped Patient is able to ambulate with a walker  Skin:    Coloration: Skin is not jaundiced or pale.     Findings: Bruising present.  Neurological:     General: No focal deficit present.     Mental Status: He is alert and oriented to person, place, and time. Mental status is at baseline.  Psychiatric:        Mood and Affect: Mood normal.        Behavior: Behavior normal.     LABS (all labs ordered are listed, but only abnormal results are displayed)  Labs Reviewed - No data to display ____________________________________________  EKG  N/a ____________________________________________  RADIOLOGY Almeta Monas, personally viewed and evaluated these images (plain radiographs) as part of my medical decision making, as well  as reviewing the written report by the radiologist.  ED MD interpretation:   I reviewed the CT of the cervical spine which does not show any acute fracture or misalignment   I reviewed the CT scan of the brain which does not show any acute intracranial process  I reviewed the x-ray of the left hand which does not show any acute fracture dislocation or foreign body  I reviewed the x-ray of the left knee which does not show any acute fracture dislocation  I reviewed the CXR which does not show any acute cardiopulmonary process        ____________________________________________   PROCEDURES  Procedure(s) performed (including Critical Care):  Marland KitchenMarland KitchenLaceration Repair  Date/Time: 08/25/2021 3:33 PM Performed by: Rada Hay, MD Authorized by: Rada Hay, MD   Consent:    Consent obtained:  Verbal   Risks discussed:  Infection and pain   Alternatives discussed:  No treatment Universal protocol:    Patient identity confirmed:  Verbally with patient Anesthesia:    Anesthesia method:  Local infiltration   Local anesthetic:  Lidocaine 2% WITH epi Laceration details:    Location:  Hand   Hand location:  L hand, dorsum   Length (cm):  5 Pre-procedure details:    Preparation:  Imaging obtained to evaluate for foreign bodies Exploration:    Limited defect created (wound extended): no     Hemostasis achieved with:  Direct pressure   Imaging obtained: x-ray     Imaging outcome: foreign body not noted     Wound exploration: wound explored through full range of motion     Wound extent: no foreign bodies/material noted, no muscle damage noted, no nerve damage noted and no tendon damage noted     Contaminated: no   Treatment:    Area cleansed with:  Saline   Amount of cleaning:  Standard   Irrigation solution:  Sterile saline   Irrigation  volume:  50   Irrigation method:  Syringe   Visualized foreign bodies/material removed: no     Debridement:  None   Undermining:  Minimal   Scar revision: no   Skin repair:    Repair method:  Sutures   Suture size:  5-0   Suture material:  Nylon   Suture technique:  Simple interrupted   Number of sutures:  6 Approximation:    Approximation:  Close Repair type:    Repair type:  Simple Post-procedure details:    Dressing:  Non-adherent dressing   Procedure completion:  Tolerated .Marland KitchenLaceration Repair  Date/Time: 08/25/2021 3:34 PM Performed by: Rada Hay, MD Authorized by: Rada Hay, MD   Consent:    Consent obtained:  Verbal   Risks discussed:  Infection   Alternatives discussed:  No treatment Universal protocol:    Patient identity confirmed:  Verbally with patient Anesthesia:    Anesthesia method:  Local infiltration   Local anesthetic:  Lidocaine 2% WITH epi Laceration details:    Location:  Hand   Hand location:  L hand, dorsum   Length (cm):  1 Exploration:    Limited defect created (wound extended): no     Hemostasis achieved with:  Direct pressure   Imaging obtained: x-ray     Imaging outcome: foreign body not noted     Wound exploration: wound explored through full range of motion     Wound extent: no muscle damage noted, no nerve damage noted and no tendon damage noted   Treatment:  Area cleansed with:  Saline   Amount of cleaning:  Standard   Irrigation solution:  Sterile saline   Irrigation volume:  50   Irrigation method:  Syringe   Visualized foreign bodies/material removed: no     Debridement:  None   Undermining:  None   Scar revision: no   Skin repair:    Repair method:  Sutures   Suture size:  5-0   Suture material:  Nylon   Suture technique:  Simple interrupted   Number of sutures:  3 Approximation:    Approximation:  Close Repair type:    Repair type:  Simple Post-procedure details:    Dressing:  Non-adherent  dressing   Procedure completion:  Tolerated   ____________________________________________   INITIAL IMPRESSION / ASSESSMENT AND PLAN / ED COURSE     Patient is a 75 year old male presents after a mechanical fall that occurred yesterday.  [place] forward when his dog pulled him on the leash.  No loss of consciousness.  Patient significant laying particularly of left upper eyelid but the exam of his globe is reassuring.  Additionally in the lid is loose and can be easily retracted.  He also has abrasions to his face but no open laceration.  CT head, max face and C-spine were obtained which were all negative for acute fracture.  On exam patient also has 2 lacerations over the dorsum of the left hand which were repaired primarily.  Remainder of his exam is notable for left anterior medial chest wall tenderness without crepitus and clear lung sounds.  No abdominal tenderness.  There is a left knee abrasion but no significant swelling bony tenderness or deformity.  Chest x-ray and left rib series are negative.  Not hypoxic and is not in any respiratory distress.  X-ray of the hand and knee are also negative.  Patient's tetanus was updated.  He was advised to return for suture removal in 7 days.  He is otherwise stable for discharge.   ____________________________________________   FINAL CLINICAL IMPRESSION(S) / ED DIAGNOSES  Final diagnoses:  Fall, initial encounter  Periorbital hematoma of left eye  Laceration of left hand without foreign body, initial encounter     ED Discharge Orders     None        Note:  This document was prepared using Dragon voice recognition software and may include unintentional dictation errors.    Rada Hay, MD 08/25/21 1540

## 2021-08-26 DIAGNOSIS — I495 Sick sinus syndrome: Secondary | ICD-10-CM | POA: Diagnosis not present

## 2021-08-26 NOTE — Telephone Encounter (Signed)
See message regarding need for f/u for sutures - discussed.

## 2021-08-27 ENCOUNTER — Ambulatory Visit (INDEPENDENT_AMBULATORY_CARE_PROVIDER_SITE_OTHER): Payer: Medicare Other | Admitting: Pharmacist

## 2021-08-27 DIAGNOSIS — I10 Essential (primary) hypertension: Secondary | ICD-10-CM

## 2021-08-27 DIAGNOSIS — E1151 Type 2 diabetes mellitus with diabetic peripheral angiopathy without gangrene: Secondary | ICD-10-CM

## 2021-08-27 DIAGNOSIS — I429 Cardiomyopathy, unspecified: Secondary | ICD-10-CM

## 2021-08-27 DIAGNOSIS — I700 Atherosclerosis of aorta: Secondary | ICD-10-CM

## 2021-08-27 DIAGNOSIS — I5022 Chronic systolic (congestive) heart failure: Secondary | ICD-10-CM

## 2021-08-27 NOTE — Telephone Encounter (Signed)
LMTCB

## 2021-08-27 NOTE — Telephone Encounter (Signed)
Patient stated that he will go back to ED to have stitches removed

## 2021-08-27 NOTE — Patient Instructions (Signed)
Visit Information   Patient verbalizes understanding of instructions provided today and agrees to view in [place].   Patient Goals/Self-Care Activities Over the next 90 days, patient will:  - take medications as prescribed focus on medication adherence by using provided medication list Continue to collaborate with interdisciplinary team        Plan: Pharmacy Goals of care met. Closing Ccm case   [name], PharmD, [place], [place] Clinical Pharmacist [employer] [facility] [employer] [phone]

## 2021-08-27 NOTE — Chronic Care Management (AMB) (Signed)
Chronic Care Management CCM Pharmacy Note  08/27/2021 Name:  ANKUR SNOWDON MRN:  [id number] DOB:  Jun 24, 1946  Summary: - Doing well. Medications reviewed.   Recommendations/Changes made from today's visit: - Continue current regimen at this time  Subjective: Gary [name] is an 75 y.o. year old male who is a primary patient of [name], MD.  The CCM team was consulted for assistance with disease management and care coordination needs.    Engaged with patient by telephone for follow up visit for pharmacy case management and/or care coordination services.   Objective:  Medications Reviewed Today     Reviewed by De Hollingshead, RPH-[name] (Pharmacist) on 08/27/21 at 1513  Med List Status: <None>   Medication Order Taking? Sig Documenting Provider Last Dose Status Informant  carvedilol (COREG) 3.125 MG tablet [id number] Yes Take 1 tablet by mouth 2 (two) times daily with a meal. [provider] Taking Active   Cholecalciferol (VITAMIN D3) 50 MCG (2000 UT) CAPS [id number] Yes Take by mouth. [provider] Taking Active   ferrous sulfate 325 (65 FE) MG tablet [id number] Yes Take 325 mg by mouth daily with breakfast. [provider] Taking Active   furosemide (LASIX) 40 MG tablet [id number] Yes Take by mouth. 2 tablets in the morning 1 tablet in the afternoon as needed [provider] Taking Active Other  glucose 4 GM chewable tablet [id number]  Chew 1 tablet (4 g total) by mouth once as needed for low blood sugar. [name], MD  Active   insulin aspart (NOVOLOG) 100 UNIT/ML injection [id number] Yes Basal rate 12am 0.8 units per hour, 9am 0.9 units per hour. (total basal insulin 20.7 units). Carbohydrate ratio 1 units for every 8gm of carbohydrate. Correction factor 1 units for every 34m/dl over target cbg. Target CBG 80-120 WLoletha Grayer MD Taking Active Other  levothyroxine (SYNTHROID) 25 MCG tablet [id number] Take 25 mcg by mouth daily  before breakfast. [provider] Taking Active   nystatin cream (MYCOSTATIN) [id number] APPLY TO AFFECTED AREA TWICE A DMaxcine Ham Charlene, MD  Active   potassium chloride (KLOR-CON) 10 MEQ tablet [id number] TAKE 1 TABLET BY MOUTH TWICE DAILY FOR 3 DAYS THEN CONTINUE ONCE DAILY SEinar Pheasant MD Taking Active   pravastatin (PRAVACHOL) 40 MG tablet [id number] Take 40 mg by mouth daily.  [provider] Taking Active Other  vitamin B-12 (CYANOCOBALAMIN) 1000 MCG tablet [id number] Take 1,000 mcg by mouth daily. [provider] Taking Active             Pertinent Labs:   Lab Results  Component Value Date   HGBA1C 7.0 01/23/2021   Lab Results  Component Value Date   CHOL 126 04/29/2021   HDL 44.20 04/29/2021   LDLCALC 72 04/29/2021   TRIG 50.0 04/29/2021   CHOLHDL 3 04/29/2021   Lab Results  Component Value Date   CREATININE 1.28 (H) 08/07/2021   BUN 29 (H) 08/07/2021   NA 135 08/07/2021   K 3.3 (L) 08/07/2021   CL 101 08/07/2021   CO2 23 08/07/2021    SDOH:  (Social Determinants of Health) assessments and interventions performed:    [place] Review of patient past medical history, allergies, medications, health status, including review of consultants reports, laboratory and other test data, was performed as part of comprehensive evaluation and provision of chronic care management services.   Care Plan : Medication Management  Updates made by TDe Hollingshead RPH-[name]  since 08/27/2021 12:00 AM     Problem: T1DM, CKD, HF      Long-Range Goal: Disease Progression Prevention   Start Date: 12/13/2020  This Visit's Progress: On track  Recent Progress: On track  Priority: High  Note:   Current Barriers:  Unable to achieve control of diabetes  Complex patient with multiple comorbidities at risk for exacerbation  Pharmacist Clinical Goal(s):  Over the next 90 days, patient will achieve adherence to monitoring guidelines and  medication adherence to achieve therapeutic efficacy through collaboration with PharmD and provider.   Interventions: 1:1 collaboration with [name], MD regarding development and update of comprehensive plan of care as evidenced by provider attestation and co-signature Inter-disciplinary care team collaboration (see longitudinal plan of care) Comprehensive medication review performed; medication list updated in electronic medical record  Health Maintenance Yearly diabetic eye exam: up to date Yearly diabetic foot exam: up to date Urine microalbumin: due Yearly influenza vaccination: up to date Td/Tdap vaccination: up to date Pneumonia vaccination: up to date COVID vaccinations: due - patient declined Shingrix vaccinations: due - patient declined Colonoscopy: due  Acute Needs: S/p fall, his dog got excited and tripped him. Reports he is still sore  Diabetes: A1c controlled but extremely fluctuant; current treatment: Novolog per [employer] 630G pump; follows w/ Dr. Honor Junes. Plan to change pump when warranty expires next year per Dr. Honor Junes Current glucose readings: using DexCom CGM. Per patient report, doing well. Reports occasionally highs and occasional lows.  Recommended to continue current regimen at this time along with collaboration with endocrinology.   HFrEF, CKD: Controlled; current treatment: carvedilol 3.125 mg BID, furosemide 80 mg QAM, 40 mg QPM, potassium 10 mEq daily Lisinopril previously removed d/t soft BP (per cardiology) Patient stopped spironolactone due to significant dehydration/symptoms of hypotension.  Current home readings: not checking BP at home  Current home weights: he believes most recently weight was 128 lbs, last week 135 lbs (but was at [facility]) Denies new shortness of breath, swelling, chest pain. Advised to continue current regimen at this time and continue collaboration with cardiology, nephrology, primary  care.  Hyperlipidemia: Controlled per last lipid panel; current treatment: pravastatin 40 mg daily Recommended to continue current regimen at this time  Hypothyroidism: Controlled per last lab work; current regimen: levothyroxine 25 mcg daily Recommended to continue current regimen at this time  Supplements: Vitamin D, Vitamin B12, ferrous sulfate  Patient Goals/Self-Care Activities Over the next 90 days, patient will:  - take medications as prescribed focus on medication adherence by using provided medication list Continue to collaborate with interdisciplinary team      Plan: Pharmacy Goals of care met. Closing Ccm case  [name], PharmD, [place], [place] Clinical Pharmacist [employer] [facility] [employer] [phone]

## 2021-08-28 ENCOUNTER — Ambulatory Visit (INDEPENDENT_AMBULATORY_CARE_PROVIDER_SITE_OTHER): Payer: Medicare Other | Admitting: Nurse Practitioner

## 2021-08-28 ENCOUNTER — Other Ambulatory Visit: Payer: Self-pay

## 2021-08-28 VITALS — BP 136/73 | HR 80 | Resp 16 | Ht 71.0 in | Wt 231.0 lb

## 2021-08-28 DIAGNOSIS — I890 Lymphedema, not elsewhere classified: Secondary | ICD-10-CM

## 2021-08-28 DIAGNOSIS — W19XXXA Unspecified fall, initial encounter: Secondary | ICD-10-CM | POA: Diagnosis not present

## 2021-08-28 DIAGNOSIS — F69 Unspecified disorder of adult personality and behavior: Secondary | ICD-10-CM | POA: Diagnosis not present

## 2021-08-28 NOTE — Progress Notes (Signed)
History of Present Illness  There is no documented history at this time  Assessments & Plan   There are no diagnoses linked to this encounter.    Additional instructions  Subjective:  Patient presents with venous ulcer of the Bilateral lower extremity.    Procedure:  3 layer unna wrap was placed Bilateral lower extremity.   Plan:   Follow up in one week.  

## 2021-08-29 ENCOUNTER — Other Ambulatory Visit: Payer: Self-pay

## 2021-08-29 ENCOUNTER — Emergency Department: Payer: Medicare Other

## 2021-08-29 ENCOUNTER — Encounter: Payer: Self-pay | Admitting: Emergency Medicine

## 2021-08-29 ENCOUNTER — Inpatient Hospital Stay
Admission: EM | Admit: 2021-08-29 | Discharge: 2021-09-13 | DRG: 638 | Disposition: A | Discharge status: Skilled Nursing Facility | Payer: Medicare Other | Attending: Internal Medicine | Admitting: Internal Medicine

## 2021-08-29 DIAGNOSIS — K219 Gastro-esophageal reflux disease without esophagitis: Secondary | ICD-10-CM | POA: Diagnosis present

## 2021-08-29 DIAGNOSIS — R5381 Other malaise: Secondary | ICD-10-CM | POA: Diagnosis not present

## 2021-08-29 DIAGNOSIS — Z87891 Personal history of nicotine dependence: Secondary | ICD-10-CM

## 2021-08-29 DIAGNOSIS — Z833 Family history of diabetes mellitus: Secondary | ICD-10-CM

## 2021-08-29 DIAGNOSIS — Z905 Acquired absence of kidney: Secondary | ICD-10-CM | POA: Diagnosis not present

## 2021-08-29 DIAGNOSIS — R6251 Failure to thrive (child): Secondary | ICD-10-CM | POA: Diagnosis not present

## 2021-08-29 DIAGNOSIS — D509 Iron deficiency anemia, unspecified: Secondary | ICD-10-CM | POA: Diagnosis present

## 2021-08-29 DIAGNOSIS — Z888 Allergy status to other drugs, medicaments and biological substances status: Secondary | ICD-10-CM

## 2021-08-29 DIAGNOSIS — I110 Hypertensive heart disease with heart failure: Secondary | ICD-10-CM | POA: Diagnosis not present

## 2021-08-29 DIAGNOSIS — S0003XA Contusion of scalp, initial encounter: Secondary | ICD-10-CM | POA: Diagnosis not present

## 2021-08-29 DIAGNOSIS — W19XXXA Unspecified fall, initial encounter: Secondary | ICD-10-CM | POA: Diagnosis not present

## 2021-08-29 DIAGNOSIS — I5022 Chronic systolic (congestive) heart failure: Secondary | ICD-10-CM | POA: Diagnosis present

## 2021-08-29 DIAGNOSIS — I2510 Atherosclerotic heart disease of native coronary artery without angina pectoris: Secondary | ICD-10-CM | POA: Diagnosis present

## 2021-08-29 DIAGNOSIS — E7800 Pure hypercholesterolemia, unspecified: Secondary | ICD-10-CM | POA: Diagnosis present

## 2021-08-29 DIAGNOSIS — I495 Sick sinus syndrome: Secondary | ICD-10-CM | POA: Diagnosis present

## 2021-08-29 DIAGNOSIS — E785 Hyperlipidemia, unspecified: Secondary | ICD-10-CM | POA: Diagnosis not present

## 2021-08-29 DIAGNOSIS — L97919 Non-pressure chronic ulcer of unspecified part of right lower leg with unspecified severity: Secondary | ICD-10-CM | POA: Diagnosis present

## 2021-08-29 DIAGNOSIS — J99 Respiratory disorders in diseases classified elsewhere: Secondary | ICD-10-CM | POA: Diagnosis not present

## 2021-08-29 DIAGNOSIS — Y92019 Unspecified place in single-family (private) house as the place of occurrence of the external cause: Secondary | ICD-10-CM

## 2021-08-29 DIAGNOSIS — Z8572 Personal history of non-Hodgkin lymphomas: Secondary | ICD-10-CM

## 2021-08-29 DIAGNOSIS — E1122 Type 2 diabetes mellitus with diabetic chronic kidney disease: Secondary | ICD-10-CM | POA: Diagnosis not present

## 2021-08-29 DIAGNOSIS — F4381 Prolonged grief disorder: Secondary | ICD-10-CM | POA: Diagnosis present

## 2021-08-29 DIAGNOSIS — N1831 Chronic kidney disease, stage 3a: Secondary | ICD-10-CM | POA: Diagnosis present

## 2021-08-29 DIAGNOSIS — R41841 Cognitive communication deficit: Secondary | ICD-10-CM | POA: Diagnosis not present

## 2021-08-29 DIAGNOSIS — Z741 Need for assistance with personal care: Secondary | ICD-10-CM | POA: Diagnosis not present

## 2021-08-29 DIAGNOSIS — F32A Depression, unspecified: Secondary | ICD-10-CM | POA: Diagnosis present

## 2021-08-29 DIAGNOSIS — E1065 Type 1 diabetes mellitus with hyperglycemia: Secondary | ICD-10-CM | POA: Diagnosis present

## 2021-08-29 DIAGNOSIS — R2681 Unsteadiness on feet: Secondary | ICD-10-CM | POA: Diagnosis not present

## 2021-08-29 DIAGNOSIS — E1042 Type 1 diabetes mellitus with diabetic polyneuropathy: Secondary | ICD-10-CM | POA: Diagnosis present

## 2021-08-29 DIAGNOSIS — D72829 Elevated white blood cell count, unspecified: Secondary | ICD-10-CM

## 2021-08-29 DIAGNOSIS — E039 Hypothyroidism, unspecified: Secondary | ICD-10-CM | POA: Diagnosis present

## 2021-08-29 DIAGNOSIS — E1043 Type 1 diabetes mellitus with diabetic autonomic (poly)neuropathy: Secondary | ICD-10-CM | POA: Diagnosis present

## 2021-08-29 DIAGNOSIS — E109 Type 1 diabetes mellitus without complications: Secondary | ICD-10-CM | POA: Diagnosis present

## 2021-08-29 DIAGNOSIS — E7849 Other hyperlipidemia: Secondary | ICD-10-CM | POA: Diagnosis not present

## 2021-08-29 DIAGNOSIS — E10311 Type 1 diabetes mellitus with unspecified diabetic retinopathy with macular edema: Secondary | ICD-10-CM | POA: Diagnosis present

## 2021-08-29 DIAGNOSIS — Z803 Family history of malignant neoplasm of breast: Secondary | ICD-10-CM

## 2021-08-29 DIAGNOSIS — Z950 Presence of cardiac pacemaker: Secondary | ICD-10-CM | POA: Diagnosis not present

## 2021-08-29 DIAGNOSIS — S61412A Laceration without foreign body of left hand, initial encounter: Secondary | ICD-10-CM | POA: Diagnosis present

## 2021-08-29 DIAGNOSIS — R627 Adult failure to thrive: Secondary | ICD-10-CM

## 2021-08-29 DIAGNOSIS — N183 Chronic kidney disease, stage 3 unspecified: Secondary | ICD-10-CM | POA: Diagnosis present

## 2021-08-29 DIAGNOSIS — Z79890 Hormone replacement therapy (postmenopausal): Secondary | ICD-10-CM

## 2021-08-29 DIAGNOSIS — L97929 Non-pressure chronic ulcer of unspecified part of left lower leg with unspecified severity: Secondary | ICD-10-CM | POA: Diagnosis present

## 2021-08-29 DIAGNOSIS — G629 Polyneuropathy, unspecified: Secondary | ICD-10-CM | POA: Diagnosis not present

## 2021-08-29 DIAGNOSIS — D72828 Other elevated white blood cell count: Secondary | ICD-10-CM | POA: Diagnosis present

## 2021-08-29 DIAGNOSIS — I890 Lymphedema, not elsewhere classified: Secondary | ICD-10-CM | POA: Diagnosis present

## 2021-08-29 DIAGNOSIS — E162 Hypoglycemia, unspecified: Secondary | ICD-10-CM | POA: Diagnosis not present

## 2021-08-29 DIAGNOSIS — Z79899 Other long term (current) drug therapy: Secondary | ICD-10-CM

## 2021-08-29 DIAGNOSIS — N189 Chronic kidney disease, unspecified: Secondary | ICD-10-CM | POA: Diagnosis not present

## 2021-08-29 DIAGNOSIS — Z7401 Bed confinement status: Secondary | ICD-10-CM | POA: Diagnosis not present

## 2021-08-29 DIAGNOSIS — E1165 Type 2 diabetes mellitus with hyperglycemia: Secondary | ICD-10-CM | POA: Diagnosis not present

## 2021-08-29 DIAGNOSIS — R296 Repeated falls: Secondary | ICD-10-CM | POA: Diagnosis present

## 2021-08-29 DIAGNOSIS — J439 Emphysema, unspecified: Secondary | ICD-10-CM | POA: Diagnosis present

## 2021-08-29 DIAGNOSIS — I130 Hypertensive heart and chronic kidney disease with heart failure and stage 1 through stage 4 chronic kidney disease, or unspecified chronic kidney disease: Secondary | ICD-10-CM | POA: Diagnosis present

## 2021-08-29 DIAGNOSIS — K3184 Gastroparesis: Secondary | ICD-10-CM | POA: Diagnosis present

## 2021-08-29 DIAGNOSIS — I5023 Acute on chronic systolic (congestive) heart failure: Secondary | ICD-10-CM | POA: Diagnosis not present

## 2021-08-29 DIAGNOSIS — S2232XA Fracture of one rib, left side, initial encounter for closed fracture: Secondary | ICD-10-CM | POA: Diagnosis present

## 2021-08-29 DIAGNOSIS — R4182 Altered mental status, unspecified: Secondary | ICD-10-CM | POA: Diagnosis not present

## 2021-08-29 DIAGNOSIS — R079 Chest pain, unspecified: Secondary | ICD-10-CM | POA: Diagnosis present

## 2021-08-29 DIAGNOSIS — N400 Enlarged prostate without lower urinary tract symptoms: Secondary | ICD-10-CM | POA: Diagnosis not present

## 2021-08-29 DIAGNOSIS — M4312 Spondylolisthesis, cervical region: Secondary | ICD-10-CM | POA: Diagnosis not present

## 2021-08-29 DIAGNOSIS — E1022 Type 1 diabetes mellitus with diabetic chronic kidney disease: Secondary | ICD-10-CM | POA: Diagnosis present

## 2021-08-29 DIAGNOSIS — R531 Weakness: Secondary | ICD-10-CM | POA: Diagnosis not present

## 2021-08-29 DIAGNOSIS — U071 COVID-19: Secondary | ICD-10-CM

## 2021-08-29 DIAGNOSIS — Z9049 Acquired absence of other specified parts of digestive tract: Secondary | ICD-10-CM

## 2021-08-29 DIAGNOSIS — I700 Atherosclerosis of aorta: Secondary | ICD-10-CM | POA: Diagnosis not present

## 2021-08-29 DIAGNOSIS — I10 Essential (primary) hypertension: Secondary | ICD-10-CM | POA: Diagnosis present

## 2021-08-29 DIAGNOSIS — Z043 Encounter for examination and observation following other accident: Secondary | ICD-10-CM | POA: Diagnosis not present

## 2021-08-29 DIAGNOSIS — E10649 Type 1 diabetes mellitus with hypoglycemia without coma: Principal | ICD-10-CM | POA: Diagnosis present

## 2021-08-29 DIAGNOSIS — S2232XD Fracture of one rib, left side, subsequent encounter for fracture with routine healing: Secondary | ICD-10-CM | POA: Diagnosis not present

## 2021-08-29 DIAGNOSIS — Z807 Family history of other malignant neoplasms of lymphoid, hematopoietic and related tissues: Secondary | ICD-10-CM

## 2021-08-29 DIAGNOSIS — S2249XA Multiple fractures of ribs, unspecified side, initial encounter for closed fracture: Secondary | ICD-10-CM | POA: Diagnosis not present

## 2021-08-29 DIAGNOSIS — E876 Hypokalemia: Secondary | ICD-10-CM | POA: Diagnosis present

## 2021-08-29 DIAGNOSIS — Z9641 Presence of insulin pump (external) (internal): Secondary | ICD-10-CM | POA: Diagnosis present

## 2021-08-29 DIAGNOSIS — Z20822 Contact with and (suspected) exposure to covid-19: Secondary | ICD-10-CM | POA: Diagnosis present

## 2021-08-29 DIAGNOSIS — R0602 Shortness of breath: Secondary | ICD-10-CM | POA: Diagnosis not present

## 2021-08-29 DIAGNOSIS — R600 Localized edema: Secondary | ICD-10-CM | POA: Diagnosis not present

## 2021-08-29 DIAGNOSIS — M6258 Muscle wasting and atrophy, not elsewhere classified, other site: Secondary | ICD-10-CM | POA: Diagnosis not present

## 2021-08-29 DIAGNOSIS — R5383 Other fatigue: Secondary | ICD-10-CM | POA: Diagnosis not present

## 2021-08-29 DIAGNOSIS — Z794 Long term (current) use of insulin: Secondary | ICD-10-CM

## 2021-08-29 DIAGNOSIS — R220 Localized swelling, mass and lump, head: Secondary | ICD-10-CM | POA: Diagnosis not present

## 2021-08-29 LAB — CBC
HCT: 41.7 % (ref 39.0–52.0)
Hemoglobin: 13.7 g/dL (ref 13.0–17.0)
MCH: 30.6 pg (ref 26.0–34.0)
MCHC: 32.9 g/dL (ref 30.0–36.0)
MCV: 93.3 fL (ref 80.0–100.0)
Platelets: 170 10*3/uL (ref 150–400)
RBC: 4.47 MIL/uL (ref 4.22–5.81)
RDW: 13.2 % (ref 11.5–15.5)
WBC: 12.2 10*3/uL — ABNORMAL HIGH (ref 4.0–10.5)
nRBC: 0 % (ref 0.0–0.2)

## 2021-08-29 LAB — CBG MONITORING, ED
Glucose-Capillary: 19 mg/dL — CL (ref 70–99)
Glucose-Capillary: 85 mg/dL (ref 70–99)
Glucose-Capillary: 114 mg/dL — ABNORMAL HIGH (ref 70–99)
Glucose-Capillary: 143 mg/dL — ABNORMAL HIGH (ref 70–99)
Glucose-Capillary: 269 mg/dL — ABNORMAL HIGH (ref 70–99)
Glucose-Capillary: 291 mg/dL — ABNORMAL HIGH (ref 70–99)
Glucose-Capillary: 337 mg/dL — ABNORMAL HIGH (ref 70–99)
Glucose-Capillary: 332 mg/dL — ABNORMAL HIGH (ref 70–99)
Glucose-Capillary: 325 mg/dL — ABNORMAL HIGH (ref 70–99)
Glucose-Capillary: 320 mg/dL — ABNORMAL HIGH (ref 70–99)
Glucose-Capillary: 182 mg/dL — ABNORMAL HIGH (ref 70–99)

## 2021-08-29 LAB — BLOOD GAS, VENOUS
Acid-base deficit: 2.7 mmol/L — ABNORMAL HIGH (ref 0.0–2.0)
Bicarbonate: 26 mmol/L (ref 20.0–28.0)
O2 Saturation: 19.5 %
Patient temperature: 37
pCO2, Ven: 58 mmHg (ref 44.0–60.0)
pH, Ven: 7.26 (ref 7.250–7.430)
pO2, Ven: <31 mmHg — CL (ref 32.0–45.0)

## 2021-08-29 LAB — ETHANOL: Alcohol, Ethyl (B): <10 mg/dL (ref <10)

## 2021-08-29 LAB — BASIC METABOLIC PANEL
Anion gap: 13 (ref 5–15)
BUN: 28 mg/dL — ABNORMAL HIGH (ref 8–23)
CO2: 25 mmol/L (ref 22–32)
Calcium: 8.5 mg/dL — ABNORMAL LOW (ref 8.9–10.3)
Chloride: 107 mmol/L (ref 98–111)
Creatinine, Ser: 1.46 mg/dL — ABNORMAL HIGH (ref 0.61–1.24)
GFR, Estimated: 50 mL/min — ABNORMAL LOW (ref >60)
Glucose, Bld: 43 mg/dL — CL (ref 70–99)
Potassium: 3.1 mmol/L — ABNORMAL LOW (ref 3.5–5.1)
Sodium: 145 mmol/L (ref 135–145)

## 2021-08-29 LAB — URINE DRUG SCREEN, QUALITATIVE (ARMC ONLY)
Amphetamines, Ur Screen: NOT DETECTED
Barbiturates, Ur Screen: NOT DETECTED
Benzodiazepine, Ur Scrn: NOT DETECTED
Cannabinoid 50 Ng, Ur ~~LOC~~: NOT DETECTED
Cocaine Metabolite,Ur ~~LOC~~: NOT DETECTED
MDMA (Ecstasy)Ur Screen: NOT DETECTED
Methadone Scn, Ur: NOT DETECTED
Opiate, Ur Screen: NOT DETECTED
Phencyclidine (PCP) Ur S: NOT DETECTED
Tricyclic, Ur Screen: NOT DETECTED

## 2021-08-29 LAB — RESP PANEL BY RT-PCR (FLU A&B, COVID) ARPGX2
Influenza A by PCR: NEGATIVE
Influenza B by PCR: NEGATIVE
SARS Coronavirus 2 by RT PCR: NEGATIVE

## 2021-08-29 LAB — TYPE AND SCREEN
ABO/RH(D): O POS
Antibody Screen: NEGATIVE

## 2021-08-29 LAB — URINALYSIS, COMPLETE (UACMP) WITH MICROSCOPIC
Bacteria, UA: NONE SEEN
Bilirubin Urine: NEGATIVE
Glucose, UA: NEGATIVE mg/dL
Ketones, ur: 5 mg/dL — AB
Leukocytes,Ua: NEGATIVE
Nitrite: NEGATIVE
Protein, ur: NEGATIVE mg/dL
Specific Gravity, Urine: 1.019 (ref 1.005–1.030)
pH: 5 (ref 5.0–8.0)

## 2021-08-29 LAB — AMMONIA: Ammonia: 19 umol/L (ref 9–35)

## 2021-08-29 LAB — PROTIME-INR
INR: 1.2 (ref 0.8–1.2)
Prothrombin Time: 15.3 seconds — ABNORMAL HIGH (ref 11.4–15.2)

## 2021-08-29 LAB — TROPONIN I (HIGH SENSITIVITY)
Troponin I (High Sensitivity): 14 ng/L (ref <18)
Troponin I (High Sensitivity): 11 ng/L (ref <18)

## 2021-08-29 LAB — HEPATIC FUNCTION PANEL
ALT: 7 U/L (ref 0–44)
AST: 17 U/L (ref 15–41)
Albumin: 3.7 g/dL (ref 3.5–5.0)
Alkaline Phosphatase: 108 U/L (ref 38–126)
Bilirubin, Direct: <0.1 mg/dL (ref 0.0–0.2)
Total Bilirubin: 0.5 mg/dL (ref 0.3–1.2)
Total Protein: 7.2 g/dL (ref 6.5–8.1)

## 2021-08-29 LAB — BRAIN NATRIURETIC PEPTIDE: B Natriuretic Peptide: 190.9 pg/mL — ABNORMAL HIGH (ref 0.0–100.0)

## 2021-08-29 LAB — TSH: TSH: 2.066 u[IU]/mL (ref 0.350–4.500)

## 2021-08-29 LAB — MAGNESIUM: Magnesium: 2 mg/dL (ref 1.7–2.4)

## 2021-08-29 MED ORDER — INSULIN ASPART 100 UNIT/ML IJ SOLN
0.0000 [IU] | Freq: Three times a day (TID) | INTRAMUSCULAR | Status: DC
  Administered 2021-08-29 (×2): 7 [IU] via SUBCUTANEOUS
  Administered 2021-08-30: 17:00:00 3 [IU] via SUBCUTANEOUS
  Administered 2021-08-30: 11:00:00 2 [IU] via SUBCUTANEOUS
  Administered 2021-08-30: 5 [IU] via SUBCUTANEOUS
  Administered 2021-08-31: 09:00:00 2 [IU] via SUBCUTANEOUS
  Administered 2021-08-31 (×2): 1 [IU] via SUBCUTANEOUS
  Administered 2021-09-01: 5 [IU] via SUBCUTANEOUS
  Administered 2021-09-01: 13:00:00 3 [IU] via SUBCUTANEOUS
  Administered 2021-09-01 – 2021-09-02 (×3): 2 [IU] via SUBCUTANEOUS
  Administered 2021-09-02: 09:00:00 1 [IU] via SUBCUTANEOUS
  Administered 2021-09-03 (×2): 5 [IU] via SUBCUTANEOUS
  Administered 2021-09-03 – 2021-09-04 (×2): 2 [IU] via SUBCUTANEOUS
  Administered 2021-09-04 – 2021-09-05 (×3): 3 [IU] via SUBCUTANEOUS
  Administered 2021-09-05: 2 [IU] via SUBCUTANEOUS
  Administered 2021-09-05 – 2021-09-06 (×2): 3 [IU] via SUBCUTANEOUS
  Administered 2021-09-06: 7 [IU] via SUBCUTANEOUS
  Administered 2021-09-06: 17:00:00 3 [IU] via SUBCUTANEOUS
  Administered 2021-09-07: 09:00:00 1 [IU] via SUBCUTANEOUS
  Filled 2021-08-29 (×27): qty 1

## 2021-08-29 MED ORDER — INSULIN GLARGINE-YFGN 100 UNIT/ML ~~LOC~~ SOLN
15.0000 [IU] | Freq: Every day | SUBCUTANEOUS | Status: DC
Start: 2021-08-29 — End: 2021-09-03
  Administered 2021-08-29 – 2021-09-03 (×6): 15 [IU] via SUBCUTANEOUS
  Filled 2021-08-29 (×6): qty 0.15

## 2021-08-29 MED ORDER — HYDRALAZINE HCL 20 MG/ML IJ SOLN: 5.0000 mg | INTRAMUSCULAR | Status: DC | PRN

## 2021-08-29 MED ORDER — ALBUTEROL SULFATE (2.5 MG/3ML) 0.083% IN NEBU: 2.5000 mg | INHALATION_SOLUTION | RESPIRATORY_TRACT | Status: DC | PRN

## 2021-08-29 MED ORDER — VITAMIN D 25 MCG (1000 UNIT) PO TABS
2000.0000 [IU] | ORAL_TABLET | Freq: Every day | ORAL | Status: DC
  Administered 2021-08-29 – 2021-09-13 (×16): 2000 [IU] via ORAL
  Filled 2021-08-29 (×16): qty 2

## 2021-08-29 MED ORDER — FERROUS SULFATE 325 (65 FE) MG PO TABS
325.0000 mg | ORAL_TABLET | Freq: Every day | ORAL | Status: DC
  Administered 2021-08-30 – 2021-09-13 (×15): 325 mg via ORAL
  Filled 2021-08-29 (×15): qty 1

## 2021-08-29 MED ORDER — ONDANSETRON HCL 4 MG/2ML IJ SOLN
4.0000 mg | Freq: Three times a day (TID) | INTRAMUSCULAR | Status: DC | PRN
  Administered 2021-09-07 – 2021-09-12 (×8): 4 mg via INTRAVENOUS
  Filled 2021-08-29 (×11): qty 2

## 2021-08-29 MED ORDER — PRAVASTATIN SODIUM 20 MG PO TABS
40.0000 mg | ORAL_TABLET | Freq: Every day | ORAL | Status: DC
  Administered 2021-08-29 – 2021-09-12 (×15): 40 mg via ORAL
  Filled 2021-08-29 (×14): qty 2

## 2021-08-29 MED ORDER — ACETAMINOPHEN 325 MG PO TABS: 650.0000 mg | ORAL_TABLET | Freq: Four times a day (QID) | ORAL | Status: DC | PRN

## 2021-08-29 MED ORDER — LEVOTHYROXINE SODIUM 25 MCG PO TABS
25.0000 ug | ORAL_TABLET | Freq: Every day | ORAL | Status: DC
Start: 2021-08-30 — End: 2021-09-13
  Administered 2021-08-30 – 2021-09-13 (×15): 25 ug via ORAL
  Filled 2021-08-29 (×15): qty 1

## 2021-08-29 MED ORDER — OXYCODONE-ACETAMINOPHEN 5-325 MG PO TABS
1.0000 | ORAL_TABLET | ORAL | Status: DC | PRN
  Administered 2021-08-29 – 2021-09-12 (×19): 1 via ORAL
  Filled 2021-08-29 (×21): qty 1

## 2021-08-29 MED ORDER — IOHEXOL 300 MG/ML  SOLN
100.0000 mL | Freq: Once | INTRAMUSCULAR | Status: AC | PRN
  Administered 2021-08-29: 100 mL via INTRAVENOUS

## 2021-08-29 MED ORDER — DEXTROSE 10 % IV SOLN: INTRAVENOUS | Status: DC

## 2021-08-29 MED ORDER — MORPHINE SULFATE (PF) 2 MG/ML IV SOLN: 0.5000 mg | INTRAVENOUS | Status: DC | PRN

## 2021-08-29 MED ORDER — ONDANSETRON HCL 4 MG/2ML IJ SOLN
4.0000 mg | Freq: Once | INTRAMUSCULAR | Status: DC
  Filled 2021-08-29: qty 2

## 2021-08-29 MED ORDER — FENTANYL CITRATE PF 50 MCG/ML IJ SOSY
50.0000 ug | PREFILLED_SYRINGE | Freq: Once | INTRAMUSCULAR | Status: DC
  Filled 2021-08-29: qty 1

## 2021-08-29 MED ORDER — DEXTROSE 50 % IV SOLN
1.0000 | Freq: Once | INTRAVENOUS | Status: AC
  Administered 2021-08-29: 50 mL via INTRAVENOUS
  Filled 2021-08-29: qty 50

## 2021-08-29 MED ORDER — VITAMIN B-12 1000 MCG PO TABS
1000.0000 ug | ORAL_TABLET | Freq: Every day | ORAL | Status: DC
  Administered 2021-08-30 – 2021-09-13 (×15): 1000 ug via ORAL
  Filled 2021-08-29 (×16): qty 1

## 2021-08-29 MED ORDER — INSULIN ASPART 100 UNIT/ML IJ SOLN
0.0000 [IU] | Freq: Every day | INTRAMUSCULAR | Status: DC
  Administered 2021-08-31: 22:00:00 2 [IU] via SUBCUTANEOUS
  Administered 2021-09-01: 21:00:00 3 [IU] via SUBCUTANEOUS
  Administered 2021-09-03: 22:00:00 2 [IU] via SUBCUTANEOUS
  Filled 2021-08-29 (×3): qty 1

## 2021-08-29 MED ORDER — POTASSIUM CHLORIDE CRYS ER 20 MEQ PO TBCR
40.0000 meq | EXTENDED_RELEASE_TABLET | ORAL | Status: AC
  Administered 2021-08-29 (×2): 40 meq via ORAL
  Filled 2021-08-29 (×2): qty 2

## 2021-08-29 MED ORDER — SODIUM CHLORIDE 0.9 % IV SOLN: INTRAVENOUS | Status: DC

## 2021-08-29 MED ORDER — DM-GUAIFENESIN ER 30-600 MG PO TB12
1.0000 | ORAL_TABLET | Freq: Two times a day (BID) | ORAL | Status: DC | PRN
  Administered 2021-09-03 – 2021-09-07 (×2): 1 via ORAL
  Filled 2021-08-29 (×2): qty 1

## 2021-08-29 NOTE — ED Notes (Signed)
This pt declined BGL check at this time. Pt Aox4, this RN comfortable with skipping one q1hr check since this pt is no longer hypoglycemic and no insulin is ordered at this time. Diabetes coordinater working with MD Blaine Hamper for diabetes protocol for this pt.

## 2021-08-29 NOTE — Progress Notes (Addendum)
Inpatient Diabetes Program Recommendations  AACE/ADA: New Consensus Statement on Inpatient Glycemic Control (2015)  Target Ranges:  Prepandial:   less than 140 mg/dL      Peak postprandial:   less than 180 mg/dL (1-2 hours)      Critically ill patients:  140 - 180 mg/dL    Latest Reference Range & Units 08/29/21 02:08 08/29/21 02:12 08/29/21 02:46 08/29/21 03:35 08/29/21 05:08 08/29/21 06:12  Glucose-Capillary 70 - 99 mg/dL 19 (LL) 85 114 (H) 143 (H) 269 (H) 291 (H)     To ED via EMS with Shortness of Breath, Altered Mental Status, and Fall Hypoglycemia on arrival  History: Type 1 Diabetes, CHF  Home DM Meds: Insulin Pump  Current Orders: None yet    MD- Patient has Type 1 diabetes and will need Basal/Bolus Insulin regimen while off of his Insulin Pump.  Since he arrived with severe Hypoglycemia, recommend we give him insulin in the hospital for safety at this point.  Can re-evaluate if he is safe to use his pump when he goes home.  Please order the following:  1. Semglee 15 units Daily (80% home dose of basal insulin on his pump)  Make sure Semglee started ASAP  2. Start Novolog Sensitive Correction Scale/ SSI (0-9 units) TID AC + HS  3. If allowed to eat, will also need Novolog Meal Coverage: Recommend Novolog 4 units TID with meals to start     Per ED MD notes, Pt's Home Insulin Pump was turned off last night upon arrival to ED  Endocrinologist: Dr. Honor Junes with [facility] Last seen 08/12/2021 Was told to call his Insurance and get New Insulin Pump (instructed to get T[employer] Control IQ Pump) Pump settings at that visit were as follows (no changes made): Basal rates Midnight = 0.675 6 AM = 0.8 12 PM = 0.75 9 PM = 0.775 TDD basal: 17.925 units  Bolus settings I/C: 9 ISF: 60 Target Glucose: 100-130 Active insulin time: 3 hours    --Will follow patient during hospitalization--  Wyn Quaker RN, MSN, CDE Diabetes Coordinator Inpatient  Glycemic Control Team Team Pager: [phone] (8a-5p)

## 2021-08-29 NOTE — H&P (Addendum)
History and Physical    DEVERE BREM OYD:[id number] DOB: Apr 20, 1946 DOA: 08/29/2021  Referring MD/NP/PA:   PCP: Gary Pheasant, MD   Patient coming from:  The patient is coming from home.      Chief Complaint: Fall  HPI: Gary [name] is a 75 y.o. male with medical history significant of type 1 diabetes on insulin pump, hypertension, hyperlipidemia, COPD, GERD, hypothyroidism, depression, peripheral neuropathy, gastroparesis, mantle cell lymphoma, hypothyroidism, Graves' disease, CHF, s/p of nephrectomy, CAD, CKD-3A, SSS, s/p of pacemaker placement, iron deficiency anemia, lymphedema, who presents with fall.  Pt is a poor historian.  History is limited.  Per report, patient recently lost his wife.  He lives alone at home.  Patient fell yesterday, and injured his left chest wall.  Complains of left rib cage pain.  He states that he cannot take deep breaths because of pain.  Initially patient was drowsy, seems to be altered, but when I saw patient in the ED, he is alert, oriented x3.  He moves all extremities normally.  No facial droop or slurred speech.  He states that he has mild shortness of breath and left side of chest wall pain.  No cough, fever or chills.  Denies nausea vomiting, diarrhea or abdominal pain.  No symptoms of UTI.  Patient has lymphedema and venous ulcer in legs. Both legs are wrapped up.  Of note, patient had fall and was seen in ED on 11/14. He had skin laceration in left hand which was sutured up. He had unremarkable CTs of his head, face and cervical spine that time.  Patient was found to have hypoglycemia with blood sugar 19, insulin pump with discontinued.  ED Course: pt was found to have WBC 12.2, INR 1.2, troponin level 14, 11, ammonia level 19, alcohol level less than 10, negative COVID PCR, renal function close to baseline, potassium 3.1, magnesium 2.0, temperature normal, soft blood pressure 91/45, heart rate 79, RR 24, oxygen saturation 97-100% on room air.  VBG  with pH 7.26, CO2 58, O2  <31. pt is admitted to med-surg bed as inpt by accepting MD.    CT HEAD, MAXILLOFACIAL WITHOUT CONTRAST, CERVICAL SPINE WITHOUT CONTRAST, CHEST and ABDOMEN AND PELVIS: No acute intracranial injury. No calvarial fracture. Small left frontal scalp hematoma.   No acute facial fracture. Mild left preseptal and infraorbital soft tissue swelling.   No acute fracture or listhesis of the cervical spine.   Acute left seventh rib fracture.  No pneumothorax.   No acute intra-abdominal pathology.   Additional incidental findings as noted above.   Aortic Atherosclerosis (ICD10-I70.0).  Fall  Review of Systems:   General: no fevers, chills, no body weight gain, has fatigue HEENT: no blurry vision, hearing changes or sore throat Respiratory: has dyspnea, no coughing, wheezing CV: no chest pain, no palpitations GI: no nausea, vomiting, abdominal pain, diarrhea, constipation GU: no dysuria, burning on urination, increased urinary frequency, hematuria  Ext: Has chronic lymphedema in both legs Neuro: no unilateral weakness, numbness, or tingling, no vision change or hearing loss. Has fall Skin: has skin laceration in the left hand which is sutured up. Has multiple bruises. MSK: has left sided rib cage pain. Heme: No easy bruising.  Travel history: No recent long distant travel.  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    Gastroparesis    Graves disease    Hypercholesterolemia    Hypertension    Mantle cell lymphoma ([place]) 07/2014   Peripheral neuropathy    Shingles     Past Surgical History:  Procedure Laterality Date   CHOLECYSTECTOMY     NEPHRECTOMY     right   PACEMAKER INSERTION N/A 12/07/2017   Procedure: INSERTION PACEMAKER DUAL CHAMBER  INITIAL INSERT;  Surgeon: Isaias Cowman, MD;  Location: [facility];  Service: Cardiovascular;  Laterality: N/A;   PORTA CATH INSERTION N/A 11/03/2017   Procedure: PORTA CATH INSERTION;  Surgeon: Katha Cabal, MD;  Location: [facility];  Service: Cardiovascular;  Laterality: N/A;    Social History:  reports that he has quit smoking. His smokeless tobacco use includes chew. He reports current alcohol use of about 3.0 standard drinks per week. He reports that he does not use drugs.  Family History:  Family History  Problem Relation Age of Onset   Breast cancer Mother    Diabetes Father    Cancer Father        Lymphoma   Alcohol abuse Maternal Uncle    Diabetes Sister    Heart disease Other        grandfather     Prior to Admission medications   Medication Sig Start Date End Date Taking? Authorizing Provider  carvedilol (COREG) 3.125 MG tablet Take 1 tablet by mouth 2 (two) times daily with a meal. 06/25/20  Yes [provider]  Cholecalciferol (VITAMIN D3) 50 MCG (2000 UT) CAPS Take 2,000 Units by mouth daily. 03/20/20  Yes [provider]  ferrous sulfate 325 (65 FE) MG tablet Take 325 mg by mouth daily with breakfast.   Yes [provider]  furosemide (LASIX) 40 MG tablet Take by mouth. 2 tablets in the morning 1 tablet in the afternoon as needed   Yes [provider]  insulin aspart (NOVOLOG) 100 UNIT/ML injection Basal rate 12am 0.8 units per hour, 9am 0.9 units per hour. (total basal insulin 20.7 units). Carbohydrate ratio 1 units for every 8gm of carbohydrate. Correction factor 1 units for every 50mg /dl over target cbg. Target CBG 80-120 10/29/17  Yes Wieting, Richard, MD  levothyroxine (SYNTHROID) 25 MCG tablet Take 25 mcg by mouth daily before breakfast.   Yes [provider]  potassium chloride (KLOR-CON) 10 MEQ tablet TAKE 1 TABLET BY MOUTH TWICE DAILY FOR 3 DAYS THEN CONTINUE ONCE DAILY 08/18/21  Yes Gary Pheasant, MD   pravastatin (PRAVACHOL) 40 MG tablet Take 40 mg by mouth daily.  03/29/19  Yes [provider]  vitamin B-12 (CYANOCOBALAMIN) 1000 MCG tablet Take 1,000 mcg by mouth daily.   Yes [provider]  glucose 4 GM chewable tablet Chew 1 tablet (4 g total) by mouth once as needed for low blood sugar. 02/09/21   Gary Pheasant, MD  nystatin cream (MYCOSTATIN) APPLY TO AFFECTED AREA TWICE A DAY Patient not taking: Reported on 08/29/2021 07/15/21   Gary Pheasant, MD    Physical Exam: Vitals:   08/29/21 1500 08/29/21 1600 08/29/21 1700 08/29/21 1800  BP: 123/69 114/62 (!) 120/58 (!) 97/58  Pulse: 85 78 85 82  Resp: (!) 22 19 (!) 22 16  Temp:      TempSrc:      SpO2: 100% 100% 100% 97%   General: Not in acute distress HEENT:       Eyes: PERRL, EOMI, no  scleral icterus.       ENT: No discharge from the ears and nose, no pharynx injection, no tonsillar enlargement.        Neck: No JVD, no bruit, no mass felt. Heme: No neck lymph node enlargement. Cardiac: S1/S2, RRR, No murmurs, No gallops or rubs. Respiratory: No rales, wheezing, rhonchi or rubs. GI: Soft, nondistended, nontender, no rebound pain, no organomegaly, BS present. GU: No hematuria Ext: has chronic lymphedema in both legs.  Both legs are wrapped up.  1+DP/PT pulse bilaterally. Musculoskeletal: has left sided rib cage tenderness Skin:  has skin laceration in the left hand which is sutured up. Has multiple bruises. Has dry blood in forehead.     Neuro: Alert, oriented X3, cranial nerves II-XII grossly intact, moves all extremities  Psych: Patient is not psychotic, no suicidal or hemocidal ideation.  Labs on Admission: I have personally reviewed following labs and imaging studies  CBC: Recent Labs  Lab 08/29/21 0020  WBC 12.2*  HGB 13.7  HCT 41.7  MCV 93.3  PLT 416   Basic Metabolic Panel: Recent Labs  Lab 08/29/21 0020  NA 145  K 3.1*  CL 107  CO2 25  GLUCOSE 43*  BUN 28*  CREATININE 1.46*   CALCIUM 8.5*  MG 2.0   GFR: Estimated Creatinine Clearance: 55.5 mL/min (A) (by C-G formula based on SCr of 1.46 mg/dL (H)). Liver Function Tests: Recent Labs  Lab 08/29/21 0020  AST 17  ALT 7  ALKPHOS 108  BILITOT 0.5  PROT 7.2  ALBUMIN 3.7   No results for input(s): LIPASE, AMYLASE in the last 168 hours. Recent Labs  Lab 08/29/21 0020  AMMONIA 19   Coagulation Profile: Recent Labs  Lab 08/29/21 0020  INR 1.2   Cardiac Enzymes: No results for input(s): CKTOTAL, CKMB, CKMBINDEX, TROPONINI in the last 168 hours. BNP (last 3 results) No results for input(s): PROBNP in the last 8760 hours. HbA1C: No results for input(s): HGBA1C in the last 72 hours. CBG: Recent Labs  Lab 08/29/21 0612 08/29/21 0724 08/29/21 0951 08/29/21 1238 08/29/21 1637  GLUCAP 291* 337* 332* 325* 320*   Lipid Profile: No results for input(s): CHOL, HDL, LDLCALC, TRIG, CHOLHDL, LDLDIRECT in the last 72 hours. Thyroid Function Tests: Recent Labs    08/29/21 0020  TSH 2.066   Anemia Panel: No results for input(s): VITAMINB12, FOLATE, FERRITIN, TIBC, IRON, RETICCTPCT in the last 72 hours. Urine analysis:    Component Value Date/Time   COLORURINE YELLOW (A) 08/29/2021 0221   APPEARANCEUR CLEAR (A) 08/29/2021 0221   APPEARANCEUR Cloudy 06/13/2014 0823   LABSPEC 1.019 08/29/2021 0221   LABSPEC 1.018 06/13/2014 0823   PHURINE 5.0 08/29/2021 0221   GLUCOSEU NEGATIVE 08/29/2021 0221   GLUCOSEU 50 mg/dL 06/13/2014 0823   HGBUR SMALL (A) 08/29/2021 0221   BILIRUBINUR NEGATIVE 08/29/2021 0221   BILIRUBINUR Negative 06/13/2014 0823   KETONESUR 5 (A) 08/29/2021 0221   PROTEINUR NEGATIVE 08/29/2021 0221   NITRITE NEGATIVE 08/29/2021 0221   LEUKOCYTESUR NEGATIVE 08/29/2021 0221   LEUKOCYTESUR 2+ 06/13/2014 0823   Sepsis Labs: @LABRCNTIP (procalcitonin:4,lacticidven:4) ) Recent Results (from the past 240 hour(s))  Resp Panel by RT-PCR (Flu A&B, Covid) Nasopharyngeal Swab     Status: None    Collection Time: 08/29/21 11:04 AM   Specimen: Nasopharyngeal Swab; Nasopharyngeal(NP) swabs in vial transport medium  Result Value Ref Range Status   SARS Coronavirus 2 by RT PCR NEGATIVE NEGATIVE Final    Comment: (NOTE) SARS-CoV-2 target nucleic acids are  NOT DETECTED.  The SARS-CoV-2 RNA is generally detectable in upper respiratory specimens during the acute phase of infection. The lowest concentration of SARS-CoV-2 viral copies this assay can detect is 138 copies/mL. A negative result does not preclude SARS-Cov-2 infection and should not be used as the sole basis for treatment or other patient management decisions. A negative result may occur with  improper specimen collection/handling, submission of specimen other than nasopharyngeal swab, presence of viral mutation(s) within the areas targeted by this assay, and inadequate number of viral copies(<138 copies/mL). A negative result must be combined with clinical observations, patient history, and epidemiological information. The expected result is Negative.  Fact Sheet for Patients:  EntrepreneurPulse.com.au  Fact Sheet for Healthcare Providers:  IncredibleEmployment.be  This test is no t yet approved or cleared by the [place] FDA and  has been authorized for detection and/or diagnosis of SARS-CoV-2 by FDA under an Emergency Use Authorization (EUA). This EUA will remain  in effect (meaning this test can be used) for the duration of the COVID-19 declaration under Section 564(b)(1) of the Act, 21 U.S.C.section 360bbb-3(b)(1), unless the authorization is terminated  or revoked sooner.       Influenza A by PCR NEGATIVE NEGATIVE Final   Influenza B by PCR NEGATIVE NEGATIVE Final    Comment: (NOTE) The Xpert Xpress SARS-CoV-2/FLU/RSV plus assay is intended as an aid in the diagnosis of influenza from Nasopharyngeal swab specimens and should not be used as a sole basis for treatment.  Nasal washings and aspirates are unacceptable for Xpert Xpress SARS-CoV-2/FLU/RSV testing.  Fact Sheet for Patients: EntrepreneurPulse.com.au  Fact Sheet for Healthcare Providers: IncredibleEmployment.be  This test is not yet approved or cleared by the [place] FDA and has been authorized for detection and/or diagnosis of SARS-CoV-2 by FDA under an Emergency Use Authorization (EUA). This EUA will remain in effect (meaning this test can be used) for the duration of the COVID-19 declaration under Section 564(b)(1) of the Act, 21 U.S.C. section 360bbb-3(b)(1), unless the authorization is terminated or revoked.  Performed at [facility], [address] [place], [place] [address]      Radiological Exams on Admission: CT HEAD WO CONTRAST (5MM)  Result Date: 08/29/2021 CLINICAL DATA:  Altered mental status, Unwitnessed fall, left rib pain EXAM: CT HEAD WITHOUT CONTRAST CT MAXILLOFACIAL WITHOUT CONTRAST CT CERVICAL SPINE WITHOUT CONTRAST CT CHEST, ABDOMEN AND PELVIS WITH CONTRAST TECHNIQUE: Contiguous axial images were obtained from the base of the skull through the vertex without intravenous contrast. Multidetector CT imaging of the maxillofacial structures was performed. Multiplanar CT image reconstructions were also generated. A small metallic BB was placed on the right temple in order to reliably differentiate right from left. Multidetector CT imaging of the cervical spine was performed without intravenous contrast. Multiplanar CT image reconstructions were also generated. Multidetector CT imaging of the chest, abdomen and pelvis was performed following the standard protocol during bolus administration of intravenous contrast. CONTRAST:  148mL OMNIPAQUE IOHEXOL 300 MG/ML  SOLN COMPARISON:  08/25/2021, 07/29/2020 FINDINGS: CT HEAD FINDINGS Brain: Normal anatomic configuration. Parenchymal volume loss is commensurate with the patient's age.  Mild periventricular white matter changes are present likely reflecting the sequela of small vessel ischemia. No abnormal intra or extra-axial mass lesion or fluid collection. No abnormal mass effect or midline shift. No evidence of acute intracranial hemorrhage or infarct. Ventricular size is normal. Cerebellum unremarkable. Vascular: No asymmetric hyperdense vasculature at the skull base. Skull: Intact Other: Mastoid air cells and middle ear cavities  are clear. Small left frontal scalp hematoma noted. CT MAXILLOFACIAL FINDINGS Osseous: No fracture or mandibular dislocation. No destructive process. Orbits: Ocular lenses have been removed. The orbits are otherwise unremarkable. Sinuses: The paranasal sinuses are clear. Soft tissues: Mild left preseptal and infraorbital soft tissue swelling. CT CERVICAL SPINE FINDINGS Alignment: 2 mm anterolisthesis of C3 upon C4 is unchanged. Skull base and vertebrae: Craniocervical alignment is normal. Atlantodental interval is not widened. No acute fracture the cervical spine. Vertebral body height has been preserved. Soft tissues and spinal canal: No prevertebral fluid or swelling. No visible canal hematoma. Disc levels: Intervertebral disc space narrowing and endplate remodeling at O8-4 and C6-7 is in keeping with changes of moderate degenerative disc disease. Minimal degenerative changes are seen throughout the remainder of the cervical spine. The prevertebral soft tissues are not thickened on sagittal reformats. Mild central canal stenosis at C3-4 secondary to anterolisthesis and associated posterior disc osteophyte complex. Review of the axial images demonstrates multilevel uncovertebral and facet arthrosis resulting in moderate left and mild right neuroforaminal narrowing at C3-4 and C4-5. Other: None CT CHEST FINDINGS Cardiovascular: Moderate coronary artery calcification. Global cardiac size within normal limits. No pericardial effusion. Central pulmonary arteries are of  normal caliber. The thoracic aorta is unremarkable. Left subclavian dual lead pacemaker is in place with leads within the right atrium and right ventricle. Left internal jugular chest port tip is seen within the superior vena cava. Mediastinum/Nodes: No enlarged mediastinal, hilar, or axillary lymph nodes. Thyroid gland, trachea, and esophagus demonstrate no significant findings. Lungs/Pleura: Mild basilar predominant pulmonary fibrotic change. No focal pulmonary infiltrate. No pneumothorax or pleural effusion. No central obstructing mass. Musculoskeletal: An acute minimally displaced fracture of the left seventh rib is seen laterally. Multiple healed bilateral rib fractures are noted. CT ABDOMEN AND PELVIS FINDINGS Hepatobiliary: No focal liver abnormality is seen. Status post cholecystectomy. No biliary dilatation. Pancreas: Unremarkable Spleen: Unremarkable Adrenals/Urinary Tract: Adrenal glands are unremarkable. Status post right nephrectomy. Left kidney is normal in size and position. Stable probable hyperdense renal cyst arising from the lower pole of the left kidney. Left kidney is otherwise unremarkable. Bladder unremarkable. Stomach/Bowel: The stomach, small bowel, and large bowel are unremarkable. No free intraperitoneal gas or fluid. Vascular/Lymphatic: Aortic atherosclerosis. No enlarged abdominal or pelvic lymph nodes. Reproductive: Mild prostatic enlargement. Other: Tiny left fat containing inguinal hernia Musculoskeletal: No acute bone abnormality within the abdomen and pelvis. Osseous structures are age-appropriate. IMPRESSION: No acute intracranial injury. No calvarial fracture. Small left frontal scalp hematoma. No acute facial fracture. Mild left preseptal and infraorbital soft tissue swelling. No acute fracture or listhesis of the cervical spine. Acute left seventh rib fracture.  No pneumothorax. No acute intra-abdominal pathology. Additional incidental findings as noted above. Aortic  Atherosclerosis (ICD10-I70.0).  Fall Electronically Signed   By: Fidela Salisbury M.D.   On: 08/29/2021 02:56   CT Cervical Spine Wo Contrast  Result Date: 08/29/2021 CLINICAL DATA:  Altered mental status, Unwitnessed fall, left rib pain EXAM: CT HEAD WITHOUT CONTRAST CT MAXILLOFACIAL WITHOUT CONTRAST CT CERVICAL SPINE WITHOUT CONTRAST CT CHEST, ABDOMEN AND PELVIS WITH CONTRAST TECHNIQUE: Contiguous axial images were obtained from the base of the skull through the vertex without intravenous contrast. Multidetector CT imaging of the maxillofacial structures was performed. Multiplanar CT image reconstructions were also generated. A small metallic BB was placed on the right temple in order to reliably differentiate right from left. Multidetector CT imaging of the cervical spine was performed without intravenous contrast. Multiplanar CT image reconstructions  were also generated. Multidetector CT imaging of the chest, abdomen and pelvis was performed following the standard protocol during bolus administration of intravenous contrast. CONTRAST:  133mL OMNIPAQUE IOHEXOL 300 MG/ML  SOLN COMPARISON:  08/25/2021, 07/29/2020 FINDINGS: CT HEAD FINDINGS Brain: Normal anatomic configuration. Parenchymal volume loss is commensurate with the patient's age. Mild periventricular white matter changes are present likely reflecting the sequela of small vessel ischemia. No abnormal intra or extra-axial mass lesion or fluid collection. No abnormal mass effect or midline shift. No evidence of acute intracranial hemorrhage or infarct. Ventricular size is normal. Cerebellum unremarkable. Vascular: No asymmetric hyperdense vasculature at the skull base. Skull: Intact Other: Mastoid air cells and middle ear cavities are clear. Small left frontal scalp hematoma noted. CT MAXILLOFACIAL FINDINGS Osseous: No fracture or mandibular dislocation. No destructive process. Orbits: Ocular lenses have been removed. The orbits are otherwise  unremarkable. Sinuses: The paranasal sinuses are clear. Soft tissues: Mild left preseptal and infraorbital soft tissue swelling. CT CERVICAL SPINE FINDINGS Alignment: 2 mm anterolisthesis of C3 upon C4 is unchanged. Skull base and vertebrae: Craniocervical alignment is normal. Atlantodental interval is not widened. No acute fracture the cervical spine. Vertebral body height has been preserved. Soft tissues and spinal canal: No prevertebral fluid or swelling. No visible canal hematoma. Disc levels: Intervertebral disc space narrowing and endplate remodeling at U9-3 and C6-7 is in keeping with changes of moderate degenerative disc disease. Minimal degenerative changes are seen throughout the remainder of the cervical spine. The prevertebral soft tissues are not thickened on sagittal reformats. Mild central canal stenosis at C3-4 secondary to anterolisthesis and associated posterior disc osteophyte complex. Review of the axial images demonstrates multilevel uncovertebral and facet arthrosis resulting in moderate left and mild right neuroforaminal narrowing at C3-4 and C4-5. Other: None CT CHEST FINDINGS Cardiovascular: Moderate coronary artery calcification. Global cardiac size within normal limits. No pericardial effusion. Central pulmonary arteries are of normal caliber. The thoracic aorta is unremarkable. Left subclavian dual lead pacemaker is in place with leads within the right atrium and right ventricle. Left internal jugular chest port tip is seen within the superior vena cava. Mediastinum/Nodes: No enlarged mediastinal, hilar, or axillary lymph nodes. Thyroid gland, trachea, and esophagus demonstrate no significant findings. Lungs/Pleura: Mild basilar predominant pulmonary fibrotic change. No focal pulmonary infiltrate. No pneumothorax or pleural effusion. No central obstructing mass. Musculoskeletal: An acute minimally displaced fracture of the left seventh rib is seen laterally. Multiple healed bilateral rib  fractures are noted. CT ABDOMEN AND PELVIS FINDINGS Hepatobiliary: No focal liver abnormality is seen. Status post cholecystectomy. No biliary dilatation. Pancreas: Unremarkable Spleen: Unremarkable Adrenals/Urinary Tract: Adrenal glands are unremarkable. Status post right nephrectomy. Left kidney is normal in size and position. Stable probable hyperdense renal cyst arising from the lower pole of the left kidney. Left kidney is otherwise unremarkable. Bladder unremarkable. Stomach/Bowel: The stomach, small bowel, and large bowel are unremarkable. No free intraperitoneal gas or fluid. Vascular/Lymphatic: Aortic atherosclerosis. No enlarged abdominal or pelvic lymph nodes. Reproductive: Mild prostatic enlargement. Other: Tiny left fat containing inguinal hernia Musculoskeletal: No acute bone abnormality within the abdomen and pelvis. Osseous structures are age-appropriate. IMPRESSION: No acute intracranial injury. No calvarial fracture. Small left frontal scalp hematoma. No acute facial fracture. Mild left preseptal and infraorbital soft tissue swelling. No acute fracture or listhesis of the cervical spine. Acute left seventh rib fracture.  No pneumothorax. No acute intra-abdominal pathology. Additional incidental findings as noted above. Aortic Atherosclerosis (ICD10-I70.0).  Fall Electronically Signed   By: Cassandria Anger  Christa See M.D.   On: 08/29/2021 02:56   CT CHEST ABDOMEN PELVIS W CONTRAST  Result Date: 08/29/2021 CLINICAL DATA:  Altered mental status, Unwitnessed fall, left rib pain EXAM: CT HEAD WITHOUT CONTRAST CT MAXILLOFACIAL WITHOUT CONTRAST CT CERVICAL SPINE WITHOUT CONTRAST CT CHEST, ABDOMEN AND PELVIS WITH CONTRAST TECHNIQUE: Contiguous axial images were obtained from the base of the skull through the vertex without intravenous contrast. Multidetector CT imaging of the maxillofacial structures was performed. Multiplanar CT image reconstructions were also generated. A small metallic BB was placed on the  right temple in order to reliably differentiate right from left. Multidetector CT imaging of the cervical spine was performed without intravenous contrast. Multiplanar CT image reconstructions were also generated. Multidetector CT imaging of the chest, abdomen and pelvis was performed following the standard protocol during bolus administration of intravenous contrast. CONTRAST:  126mL OMNIPAQUE IOHEXOL 300 MG/ML  SOLN COMPARISON:  08/25/2021, 07/29/2020 FINDINGS: CT HEAD FINDINGS Brain: Normal anatomic configuration. Parenchymal volume loss is commensurate with the patient's age. Mild periventricular white matter changes are present likely reflecting the sequela of small vessel ischemia. No abnormal intra or extra-axial mass lesion or fluid collection. No abnormal mass effect or midline shift. No evidence of acute intracranial hemorrhage or infarct. Ventricular size is normal. Cerebellum unremarkable. Vascular: No asymmetric hyperdense vasculature at the skull base. Skull: Intact Other: Mastoid air cells and middle ear cavities are clear. Small left frontal scalp hematoma noted. CT MAXILLOFACIAL FINDINGS Osseous: No fracture or mandibular dislocation. No destructive process. Orbits: Ocular lenses have been removed. The orbits are otherwise unremarkable. Sinuses: The paranasal sinuses are clear. Soft tissues: Mild left preseptal and infraorbital soft tissue swelling. CT CERVICAL SPINE FINDINGS Alignment: 2 mm anterolisthesis of C3 upon C4 is unchanged. Skull base and vertebrae: Craniocervical alignment is normal. Atlantodental interval is not widened. No acute fracture the cervical spine. Vertebral body height has been preserved. Soft tissues and spinal canal: No prevertebral fluid or swelling. No visible canal hematoma. Disc levels: Intervertebral disc space narrowing and endplate remodeling at J6-2 and C6-7 is in keeping with changes of moderate degenerative disc disease. Minimal degenerative changes are seen  throughout the remainder of the cervical spine. The prevertebral soft tissues are not thickened on sagittal reformats. Mild central canal stenosis at C3-4 secondary to anterolisthesis and associated posterior disc osteophyte complex. Review of the axial images demonstrates multilevel uncovertebral and facet arthrosis resulting in moderate left and mild right neuroforaminal narrowing at C3-4 and C4-5. Other: None CT CHEST FINDINGS Cardiovascular: Moderate coronary artery calcification. Global cardiac size within normal limits. No pericardial effusion. Central pulmonary arteries are of normal caliber. The thoracic aorta is unremarkable. Left subclavian dual lead pacemaker is in place with leads within the right atrium and right ventricle. Left internal jugular chest port tip is seen within the superior vena cava. Mediastinum/Nodes: No enlarged mediastinal, hilar, or axillary lymph nodes. Thyroid gland, trachea, and esophagus demonstrate no significant findings. Lungs/Pleura: Mild basilar predominant pulmonary fibrotic change. No focal pulmonary infiltrate. No pneumothorax or pleural effusion. No central obstructing mass. Musculoskeletal: An acute minimally displaced fracture of the left seventh rib is seen laterally. Multiple healed bilateral rib fractures are noted. CT ABDOMEN AND PELVIS FINDINGS Hepatobiliary: No focal liver abnormality is seen. Status post cholecystectomy. No biliary dilatation. Pancreas: Unremarkable Spleen: Unremarkable Adrenals/Urinary Tract: Adrenal glands are unremarkable. Status post right nephrectomy. Left kidney is normal in size and position. Stable probable hyperdense renal cyst arising from the lower pole of the left kidney. Left kidney  is otherwise unremarkable. Bladder unremarkable. Stomach/Bowel: The stomach, small bowel, and large bowel are unremarkable. No free intraperitoneal gas or fluid. Vascular/Lymphatic: Aortic atherosclerosis. No enlarged abdominal or pelvic lymph nodes.  Reproductive: Mild prostatic enlargement. Other: Tiny left fat containing inguinal hernia Musculoskeletal: No acute bone abnormality within the abdomen and pelvis. Osseous structures are age-appropriate. IMPRESSION: No acute intracranial injury. No calvarial fracture. Small left frontal scalp hematoma. No acute facial fracture. Mild left preseptal and infraorbital soft tissue swelling. No acute fracture or listhesis of the cervical spine. Acute left seventh rib fracture.  No pneumothorax. No acute intra-abdominal pathology. Additional incidental findings as noted above. Aortic Atherosclerosis (ICD10-I70.0).  Fall Electronically Signed   By: Fidela Salisbury M.D.   On: 08/29/2021 02:56   CT Maxillofacial Wo Contrast  Result Date: 08/29/2021 CLINICAL DATA:  Altered mental status, Unwitnessed fall, left rib pain EXAM: CT HEAD WITHOUT CONTRAST CT MAXILLOFACIAL WITHOUT CONTRAST CT CERVICAL SPINE WITHOUT CONTRAST CT CHEST, ABDOMEN AND PELVIS WITH CONTRAST TECHNIQUE: Contiguous axial images were obtained from the base of the skull through the vertex without intravenous contrast. Multidetector CT imaging of the maxillofacial structures was performed. Multiplanar CT image reconstructions were also generated. A small metallic BB was placed on the right temple in order to reliably differentiate right from left. Multidetector CT imaging of the cervical spine was performed without intravenous contrast. Multiplanar CT image reconstructions were also generated. Multidetector CT imaging of the chest, abdomen and pelvis was performed following the standard protocol during bolus administration of intravenous contrast. CONTRAST:  151mL OMNIPAQUE IOHEXOL 300 MG/ML  SOLN COMPARISON:  08/25/2021, 07/29/2020 FINDINGS: CT HEAD FINDINGS Brain: Normal anatomic configuration. Parenchymal volume loss is commensurate with the patient's age. Mild periventricular white matter changes are present likely reflecting the sequela of small vessel  ischemia. No abnormal intra or extra-axial mass lesion or fluid collection. No abnormal mass effect or midline shift. No evidence of acute intracranial hemorrhage or infarct. Ventricular size is normal. Cerebellum unremarkable. Vascular: No asymmetric hyperdense vasculature at the skull base. Skull: Intact Other: Mastoid air cells and middle ear cavities are clear. Small left frontal scalp hematoma noted. CT MAXILLOFACIAL FINDINGS Osseous: No fracture or mandibular dislocation. No destructive process. Orbits: Ocular lenses have been removed. The orbits are otherwise unremarkable. Sinuses: The paranasal sinuses are clear. Soft tissues: Mild left preseptal and infraorbital soft tissue swelling. CT CERVICAL SPINE FINDINGS Alignment: 2 mm anterolisthesis of C3 upon C4 is unchanged. Skull base and vertebrae: Craniocervical alignment is normal. Atlantodental interval is not widened. No acute fracture the cervical spine. Vertebral body height has been preserved. Soft tissues and spinal canal: No prevertebral fluid or swelling. No visible canal hematoma. Disc levels: Intervertebral disc space narrowing and endplate remodeling at P7-1 and C6-7 is in keeping with changes of moderate degenerative disc disease. Minimal degenerative changes are seen throughout the remainder of the cervical spine. The prevertebral soft tissues are not thickened on sagittal reformats. Mild central canal stenosis at C3-4 secondary to anterolisthesis and associated posterior disc osteophyte complex. Review of the axial images demonstrates multilevel uncovertebral and facet arthrosis resulting in moderate left and mild right neuroforaminal narrowing at C3-4 and C4-5. Other: None CT CHEST FINDINGS Cardiovascular: Moderate coronary artery calcification. Global cardiac size within normal limits. No pericardial effusion. Central pulmonary arteries are of normal caliber. The thoracic aorta is unremarkable. Left subclavian dual lead pacemaker is in place  with leads within the right atrium and right ventricle. Left internal jugular chest port tip is seen within  the superior vena cava. Mediastinum/Nodes: No enlarged mediastinal, hilar, or axillary lymph nodes. Thyroid gland, trachea, and esophagus demonstrate no significant findings. Lungs/Pleura: Mild basilar predominant pulmonary fibrotic change. No focal pulmonary infiltrate. No pneumothorax or pleural effusion. No central obstructing mass. Musculoskeletal: An acute minimally displaced fracture of the left seventh rib is seen laterally. Multiple healed bilateral rib fractures are noted. CT ABDOMEN AND PELVIS FINDINGS Hepatobiliary: No focal liver abnormality is seen. Status post cholecystectomy. No biliary dilatation. Pancreas: Unremarkable Spleen: Unremarkable Adrenals/Urinary Tract: Adrenal glands are unremarkable. Status post right nephrectomy. Left kidney is normal in size and position. Stable probable hyperdense renal cyst arising from the lower pole of the left kidney. Left kidney is otherwise unremarkable. Bladder unremarkable. Stomach/Bowel: The stomach, small bowel, and large bowel are unremarkable. No free intraperitoneal gas or fluid. Vascular/Lymphatic: Aortic atherosclerosis. No enlarged abdominal or pelvic lymph nodes. Reproductive: Mild prostatic enlargement. Other: Tiny left fat containing inguinal hernia Musculoskeletal: No acute bone abnormality within the abdomen and pelvis. Osseous structures are age-appropriate. IMPRESSION: No acute intracranial injury. No calvarial fracture. Small left frontal scalp hematoma. No acute facial fracture. Mild left preseptal and infraorbital soft tissue swelling. No acute fracture or listhesis of the cervical spine. Acute left seventh rib fracture.  No pneumothorax. No acute intra-abdominal pathology. Additional incidental findings as noted above. Aortic Atherosclerosis (ICD10-I70.0).  Fall Electronically Signed   By: Fidela Salisbury M.D.   On: 08/29/2021 02:56      EKG: I have personally reviewed.  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  Insulin pump was discontinued.  Patient was treated with D50 and D10 gtt.  Blood sugar improved to 337.  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  Troponin level 14, 11 -Continue pravastatin  Hypercholesterolemia -Pravastatin  Fall-recurrent -PT/OT -Fall precaution  Iron deficiency anemia: Hemoglobin stable, 13.7 -Continue iron supplement  CKD (chronic kidney disease), stage IIIa: Close to baseline.  Baseline creatinine 1.2-1.4.  His creatinine is 1.46, BUN 28 -Follow-up with BMP  Chronic systolic CHF (congestive heart failure) ([facility]): BNP 198, no respiratory distress.  No JVD, does not seem to have CHF exacerbation -Hold Lasix due to softer blood pressure  Hypokalemia: Potassium 3.1 -Repleted potassium -Check magnesium level --> 2.0  Left rib fracture_7th rib -Pain control: As needed Percocet, Tylenol, morphine -Incentive spirometry  Leukocytosis: Most source of infection identified.  No fever.  Urinalysis negative.  Likely reactive -Follow-up with CBC  HTN (hypertension): Blood pressure is soft -IV hydralazine as needed -Hold Coreg, Lasix  Hypothyroid -Continue Synthroid  CAD (coronary artery disease): -Continue  pravastatin  Skin laceration in left hand: -Wound care consult    DVT ppx: SCD Code Status: Full code per pt and his siter Family Communication:  Yes, patient's sister by phone Disposition Plan:  to be determined, possible SNF Consults called:  none  Admission status and Level of care: Med-Surg:   as inpt          Status is: Inpatient  Remains inpatient appropriate because: Patient has multiple comorbidities, now presents with fall, left seventh rib fracture, hypoglycemia, leukocytosis, hyperglycemia.  His presentation is highly complicated patient is high risk of deteriorating.  Need to be treated in hospital for at least 2 days.          Date of Service 08/29/2021    [facility]   If 7PM-7AM, please contact night-coverage [email or web address] 08/29/2021, 8:04 PM

## 2021-08-29 NOTE — ED Provider Notes (Addendum)
[facility] Emergency Department Provider Note ____________________________________________   Event Date/Time   First MD Initiated Contact with Patient 08/29/21 0021     (approximate)  I have reviewed the triage vital signs and the nursing notes.   HISTORY  Chief Complaint Shortness of Breath, Altered Mental Status, and Fall    HPI Gary [name] is a 75 y.o. male with history of insulin-dependent diabetes, hypertension, hyperlipidemia, CHF status post pacemaker, depression, venous ulcers to bilateral lower extremities who presents to the emergency department from home with EMS for concerns for frequent falls.  Patient is an extremely poor historian and appears to be in a lot of pain.  Most of the history is provided by EMS.  They state that he lives at home alone and recently lost his wife about a month ago.  EMS reports that his neighbor called 911 for unknown reasons.  When EMS arrived patient was complaining of left-sided chest pain and stating that he could not breathe.  Seemed to be altered with EMS and drowsy.  Was just seen in the emergency department on 08/25/2021 after a fall and had unremarkable CTs of his head, face and cervical spine.  It appears per that ED note patient lost his balance while walking his dog.  Patient received a tetanus vaccination 08/25/2021 as well as 07/14/2018.    It appears on review of his records he last had an office visit with an endocrinologist on 08/12/2021 and has a [employer]tronic 630 G insulin pump.  It does not appear he is on any long-acting medications.  Per his endocrinologist note he has had multiple episodes of severe hypoglycemia over the years.     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[place]) 07/2014   Peripheral neuropathy    Shingles     Patient Active Problem List   Diagnosis Date Noted   Recurrent falls 08/29/2021   Porokeratosis 05/15/2021   Aortic atherosclerosis ([place]) 05/05/2021   Emphysema lung ([place]) 05/05/2021   Thrombocytopenia ([place]) 04/29/2021   Splinter of foot without infection 02/23/2021   Arm laceration 02/09/2021   Hypokalemia 10/24/2020   Injury by nail 08/15/2020   Shingles 07/24/2020   Rash 07/02/2020   Colon cancer screening 07/02/2020   Sick sinus syndrome ([place]) 12/28/2019   Acquired absence of kidney 09/13/2019   Edema of lower extremity 09/13/2019   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, or unspecified 09/13/2019   Proteinuria 09/13/2019   Lower extremity pain, bilateral    Diabetic foot ulcer associated with type 1 diabetes mellitus ([place])    Acute on chronic systolic CHF (congestive heart failure) ([place])    Cellulitis 08/18/2019   Lower limb ulcer, calf ([place]) 08/01/2019   Left hip pain 07/16/2019   Pain due to onychomycosis of toenails of both feet 04/03/2019   Swelling of limb 02/07/2019   Pancytopenia ([place]) 12/03/2018   CKD (chronic kidney disease) stage 3, GFR 30-59 ml/min (HCC) 09/16/2018   Left shoulder pain 09/16/2018   Chronic cholecystitis 12/29/2017   Graves disease 12/29/2017   Peripheral neuropathy 12/29/2017   S/p nephrectomy 12/29/2017   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   Symptomatic bradycardia 12/02/2017  AKI (acute kidney injury) ([place]) 25/02/3975   Chronic systolic CHF (congestive heart failure) ([place]) 12/02/2017   Saturday night paralysis 11/12/2017   Hypoglycemia 10/28/2017   Goals of care, counseling/discussion 10/24/2017   Lymphedema 10/20/2017   Iron deficiency anemia 09/11/2017   Fall 08/13/2017   Humerus fracture 08/13/2017   Anemia 01/12/2017   Bilateral carotid artery stenosis 06/29/2016   Hand laceration 12/16/2015   Adjustment  disorder with depressed mood 08/11/2015   Cardiomyopathy, idiopathic ([place]) 04/22/2015   CAD in native artery 11/02/2014   Mantle cell lymphoma ([place]) 08/03/2014   History of colonic polyps 04/29/2014   Irregular heart beat 04/22/2014   SOB (shortness of breath) 04/22/2014   Stress 03/21/2014   PVC (premature ventricular contraction) 02/21/2014   GERD (gastroesophageal reflux disease) 10/23/2013   Gastroparesis 06/25/2013   Chest pain 04/13/2013   Thyroid disease 04/13/2013   Diabetes ([place]) 04/13/2013   Essential hypertension, benign 04/13/2013   Hypercholesterolemia 04/13/2013    Past Surgical History:  Procedure Laterality Date   CHOLECYSTECTOMY     NEPHRECTOMY     right   PACEMAKER INSERTION N/A 12/07/2017   Procedure: INSERTION PACEMAKER DUAL CHAMBER INITIAL INSERT;  Surgeon: Isaias Cowman, MD;  Location: [facility] ORS;  Service: Cardiovascular;  Laterality: N/A;   PORTA CATH INSERTION N/A 11/03/2017   Procedure: PORTA CATH INSERTION;  Surgeon: Katha Cabal, MD;  Location: [place] [facility];  Service: Cardiovascular;  Laterality: N/A;    Prior to Admission medications   Medication Sig Start Date End Date Taking? Authorizing Provider  carvedilol (COREG) 3.125 MG tablet Take 1 tablet by mouth 2 (two) times daily with a meal. 06/25/20  Yes [provider]  Cholecalciferol (VITAMIN D3) 50 MCG (2000 UT) CAPS Take 2,000 Units by mouth daily. 03/20/20  Yes [provider]  ferrous sulfate 325 (65 FE) MG tablet Take 325 mg by mouth daily with breakfast.   Yes [provider]  furosemide (LASIX) 40 MG tablet Take by mouth. 2 tablets in the morning 1 tablet in the afternoon as needed   Yes [provider]  insulin aspart (NOVOLOG) 100 UNIT/ML injection Basal rate 12am 0.8 units per hour, 9am 0.9 units per hour. (total basal insulin 20.7 units). Carbohydrate ratio 1 units for every 8gm of carbohydrate. Correction factor 1 units for every 50mg /dl  over target cbg. Target CBG 80-120 10/29/17  Yes Wieting, Richard, MD  levothyroxine (SYNTHROID) 25 MCG tablet Take 25 mcg by mouth daily before breakfast.   Yes [provider]  potassium chloride (KLOR-CON) 10 MEQ tablet TAKE 1 TABLET BY MOUTH TWICE DAILY FOR 3 DAYS THEN CONTINUE ONCE DAILY 08/18/21  Yes [name], MD  pravastatin (PRAVACHOL) 40 MG tablet Take 40 mg by mouth daily.  03/29/19  Yes [provider]  vitamin B-12 (CYANOCOBALAMIN) 1000 MCG tablet Take 1,000 mcg by mouth daily.   Yes [provider]  glucose 4 GM chewable tablet Chew 1 tablet (4 g total) by mouth once as needed for low blood sugar. 02/09/21   [name], MD  nystatin cream (MYCOSTATIN) APPLY TO AFFECTED AREA TWICE A DAY Patient not taking: Reported on 08/29/2021 07/15/21   [name], MD    Allergies Rofecoxib  Family History  Problem Relation Age of Onset   Breast cancer Mother    Diabetes Father    Cancer Father        Lymphoma   Alcohol abuse Maternal Uncle    Diabetes Sister    Heart  disease Other        grandfather    Social History Social History   Tobacco Use   Smoking status: Former   Smokeless tobacco: Current    Types: Nurse, children's Use: Never used  Substance Use Topics   Alcohol use: Yes    Alcohol/week: 3.0 standard drinks    Types: 3 Cans of beer per week    Comment: nightly   Drug use: Never    Review of Systems Level 5 caveat secondary to altered mental status   ____________________________________________   PHYSICAL EXAM:  VITAL SIGNS: ED Triage Vitals [08/29/21 0043]  Enc Vitals Group     BP 121/61     Pulse Rate 85     Resp (!) 22     Temp 98.6 F (37 C)     Temp Source Oral     SpO2 99 %     Weight      Height      Head Circumference      Peak Flow      Pain Score      Pain Loc      Pain Edu?      Excl. in [place]?    CONSTITUTIONAL: Alert and oriented x 3 but has a hard time answering questions  appropriately.  Intermittently crying out in pain.  Elderly, obese, chronically ill-appearing. HEAD: Normocephalic; significant bruising noted to the face and head.  Large abrasion to the forehead. EYES: Small bilateral subconjunctival hemorrhages.  No hyphema or hypopyon.  Pupils equal and reactive to light.  Extraocular movements appear intact. ENT: normal nose; no rhinorrhea; moist mucous membranes; pharynx without lesions noted; no dental injury; no septal hematoma NECK: Supple, no meningismus, no LAD; no midline spinal tenderness, step-off or deformity; trachea midline CARD: RRR; S1 and S2 appreciated; no murmurs, no clicks, no rubs, no gallops RESP: Normal chest excursion without splinting or tachypnea; breath sounds clear and equal bilaterally; no wheezes, no rhonchi, no rales; no hypoxia or respiratory distress CHEST:  chest wall stable, extremely tender to palpation over the left chest wall without crepitus or ecchymosis ABD/GI: Normal bowel sounds; non-distended; soft, non-tender, no rebound, no guarding; no ecchymosis or other lesions noted PELVIS:  stable, nontender to palpation BACK:  The back appears normal and is non-tender to palpation, there is no CVA tenderness; no midline spinal tenderness, step-off or deformity EXT: Patient has wounds that are wrapped to his bilateral lower extremities.  Multiple areas of ecchymosis to his extremities but no bony tenderness or bony abnormality.  Compartments soft.  Extremities seem warm and well-perfused. SKIN: Normal color for age and race; warm NEURO: Moves all extremities equally, no facial asymmetry, normal phonation PSYCH: The patient's mood and manner are appropriate. Grooming and personal hygiene are appropriate.  ____________________________________________   LABS (all labs ordered are listed, but only abnormal results are displayed)  Labs Reviewed  BASIC METABOLIC PANEL - Abnormal; Notable for the following components:      Result  Value   Potassium 3.1 (*)    Glucose, Bld 43 (*)    BUN 28 (*)    Creatinine, Ser 1.46 (*)    Calcium 8.5 (*)    GFR, Estimated 50 (*)    All other components within normal limits  CBC - Abnormal; Notable for the following components:   WBC 12.2 (*)    All other components within normal limits  BRAIN NATRIURETIC PEPTIDE - Abnormal; Notable for the following  components:   B Natriuretic Peptide 190.9 (*)    All other components within normal limits  BLOOD GAS, VENOUS - Abnormal; Notable for the following components:   pO2, Ven <31.0 (*)    Acid-base deficit 2.7 (*)    All other components within normal limits  URINALYSIS, COMPLETE (UACMP) WITH MICROSCOPIC - Abnormal; Notable for the following components:   Color, Urine YELLOW (*)    APPearance CLEAR (*)    Hgb urine dipstick SMALL (*)    Ketones, ur 5 (*)    All other components within normal limits  PROTIME-INR - Abnormal; Notable for the following components:   Prothrombin Time 15.3 (*)    All other components within normal limits  CBG MONITORING, ED - Abnormal; Notable for the following components:   Glucose-Capillary 19 (*)    All other components within normal limits  CBG MONITORING, ED - Abnormal; Notable for the following components:   Glucose-Capillary 114 (*)    All other components within normal limits  CBG MONITORING, ED - Abnormal; Notable for the following components:   Glucose-Capillary 143 (*)    All other components within normal limits  ETHANOL  AMMONIA  HEPATIC FUNCTION PANEL  MAGNESIUM  TSH  URINE DRUG SCREEN, QUALITATIVE ([facility] ONLY)  CBG MONITORING, ED  CBG MONITORING, ED  CBG MONITORING, ED  CBG MONITORING, ED  CBG MONITORING, ED  CBG MONITORING, ED  CBG MONITORING, ED  CBG MONITORING, ED  CBG MONITORING, ED  CBG MONITORING, ED  CBG MONITORING, ED  CBG MONITORING, ED  CBG MONITORING, ED  CBG MONITORING, ED  CBG MONITORING, ED  CBG MONITORING, ED  CBG MONITORING, ED  CBG MONITORING, ED   CBG MONITORING, ED  CBG MONITORING, ED  TYPE AND SCREEN  TROPONIN I (HIGH SENSITIVITY)  TROPONIN I (HIGH SENSITIVITY)   ____________________________________________  EKG   EKG Interpretation  Date/Time:  Friday August 29 2021 00:33:20 EST Ventricular Rate:  83 PR Interval:  244 QRS Duration: 178 QT Interval:  460 QTC Calculation: 540 R Axis:   -73 Text Interpretation: Atrial-sensed ventricular-paced rhythm with prolonged AV conduction Abnormal ECG Confirmed by Pryor Curia [phone]) on 08/29/2021 2:34:55 AM        ____________________________________________  RADIOLOGY I, Elisah Parmer, personally viewed and evaluated these images (plain radiographs) as part of my medical decision making, as well as reviewing the written report by the radiologist.  ED MD interpretation: Left seventh rib fracture.  Official radiology report(s): CT HEAD WO CONTRAST (5MM)  Result Date: 08/29/2021 CLINICAL DATA:  Altered mental status, Unwitnessed fall, left rib pain EXAM: CT HEAD WITHOUT CONTRAST CT MAXILLOFACIAL WITHOUT CONTRAST CT CERVICAL SPINE WITHOUT CONTRAST CT CHEST, ABDOMEN AND PELVIS WITH CONTRAST TECHNIQUE: Contiguous axial images were obtained from the base of the skull through the vertex without intravenous contrast. Multidetector CT imaging of the maxillofacial structures was performed. Multiplanar CT image reconstructions were also generated. A small metallic BB was placed on the right temple in order to reliably differentiate right from left. Multidetector CT imaging of the cervical spine was performed without intravenous contrast. Multiplanar CT image reconstructions were also generated. Multidetector CT imaging of the chest, abdomen and pelvis was performed following the standard protocol during bolus administration of intravenous contrast. CONTRAST:  149mL OMNIPAQUE IOHEXOL 300 MG/ML  SOLN COMPARISON:  08/25/2021, 07/29/2020 FINDINGS: CT HEAD FINDINGS Brain: Normal anatomic  configuration. Parenchymal volume loss is commensurate with the patient's age. Mild periventricular white matter changes are present likely reflecting the sequela of small  vessel ischemia. No abnormal intra or extra-axial mass lesion or fluid collection. No abnormal mass effect or midline shift. No evidence of acute intracranial hemorrhage or infarct. Ventricular size is normal. Cerebellum unremarkable. Vascular: No asymmetric hyperdense vasculature at the skull base. Skull: Intact Other: Mastoid air cells and middle ear cavities are clear. Small left frontal scalp hematoma noted. CT MAXILLOFACIAL FINDINGS Osseous: No fracture or mandibular dislocation. No destructive process. Orbits: Ocular lenses have been removed. The orbits are otherwise unremarkable. Sinuses: The paranasal sinuses are clear. Soft tissues: Mild left preseptal and infraorbital soft tissue swelling. CT CERVICAL SPINE FINDINGS Alignment: 2 mm anterolisthesis of C3 upon C4 is unchanged. Skull base and vertebrae: Craniocervical alignment is normal. Atlantodental interval is not widened. No acute fracture the cervical spine. Vertebral body height has been preserved. Soft tissues and spinal canal: No prevertebral fluid or swelling. No visible canal hematoma. Disc levels: Intervertebral disc space narrowing and endplate remodeling at W0-9 and C6-7 is in keeping with changes of moderate degenerative disc disease. Minimal degenerative changes are seen throughout the remainder of the cervical spine. The prevertebral soft tissues are not thickened on sagittal reformats. Mild central canal stenosis at C3-4 secondary to anterolisthesis and associated posterior disc osteophyte complex. Review of the axial images demonstrates multilevel uncovertebral and facet arthrosis resulting in moderate left and mild right neuroforaminal narrowing at C3-4 and C4-5. Other: None CT CHEST FINDINGS Cardiovascular: Moderate coronary artery calcification. Global cardiac size  within normal limits. No pericardial effusion. Central pulmonary arteries are of normal caliber. The thoracic aorta is unremarkable. Left subclavian dual lead pacemaker is in place with leads within the right atrium and right ventricle. Left internal jugular chest port tip is seen within the superior vena cava. Mediastinum/Nodes: No enlarged mediastinal, hilar, or axillary lymph nodes. Thyroid gland, trachea, and esophagus demonstrate no significant findings. Lungs/Pleura: Mild basilar predominant pulmonary fibrotic change. No focal pulmonary infiltrate. No pneumothorax or pleural effusion. No central obstructing mass. Musculoskeletal: An acute minimally displaced fracture of the left seventh rib is seen laterally. Multiple healed bilateral rib fractures are noted. CT ABDOMEN AND PELVIS FINDINGS Hepatobiliary: No focal liver abnormality is seen. Status post cholecystectomy. No biliary dilatation. Pancreas: Unremarkable Spleen: Unremarkable Adrenals/Urinary Tract: Adrenal glands are unremarkable. Status post right nephrectomy. Left kidney is normal in size and position. Stable probable hyperdense renal cyst arising from the lower pole of the left kidney. Left kidney is otherwise unremarkable. Bladder unremarkable. Stomach/Bowel: The stomach, small bowel, and large bowel are unremarkable. No free intraperitoneal gas or fluid. Vascular/Lymphatic: Aortic atherosclerosis. No enlarged abdominal or pelvic lymph nodes. Reproductive: Mild prostatic enlargement. Other: Tiny left fat containing inguinal hernia Musculoskeletal: No acute bone abnormality within the abdomen and pelvis. Osseous structures are age-appropriate. IMPRESSION: No acute intracranial injury. No calvarial fracture. Small left frontal scalp hematoma. No acute facial fracture. Mild left preseptal and infraorbital soft tissue swelling. No acute fracture or listhesis of the cervical spine. Acute left seventh rib fracture.  No pneumothorax. No acute  intra-abdominal pathology. Additional incidental findings as noted above. Aortic Atherosclerosis (ICD10-I70.0).  Fall Electronically Signed   By: Fidela Salisbury M.D.   On: 08/29/2021 02:56   CT Cervical Spine Wo Contrast  Result Date: 08/29/2021 CLINICAL DATA:  Altered mental status, Unwitnessed fall, left rib pain EXAM: CT HEAD WITHOUT CONTRAST CT MAXILLOFACIAL WITHOUT CONTRAST CT CERVICAL SPINE WITHOUT CONTRAST CT CHEST, ABDOMEN AND PELVIS WITH CONTRAST TECHNIQUE: Contiguous axial images were obtained from the base of the skull through the vertex without  intravenous contrast. Multidetector CT imaging of the maxillofacial structures was performed. Multiplanar CT image reconstructions were also generated. A small metallic BB was placed on the right temple in order to reliably differentiate right from left. Multidetector CT imaging of the cervical spine was performed without intravenous contrast. Multiplanar CT image reconstructions were also generated. Multidetector CT imaging of the chest, abdomen and pelvis was performed following the standard protocol during bolus administration of intravenous contrast. CONTRAST:  150mL OMNIPAQUE IOHEXOL 300 MG/ML  SOLN COMPARISON:  08/25/2021, 07/29/2020 FINDINGS: CT HEAD FINDINGS Brain: Normal anatomic configuration. Parenchymal volume loss is commensurate with the patient's age. Mild periventricular white matter changes are present likely reflecting the sequela of small vessel ischemia. No abnormal intra or extra-axial mass lesion or fluid collection. No abnormal mass effect or midline shift. No evidence of acute intracranial hemorrhage or infarct. Ventricular size is normal. Cerebellum unremarkable. Vascular: No asymmetric hyperdense vasculature at the skull base. Skull: Intact Other: Mastoid air cells and middle ear cavities are clear. Small left frontal scalp hematoma noted. CT MAXILLOFACIAL FINDINGS Osseous: No fracture or mandibular dislocation. No destructive  process. Orbits: Ocular lenses have been removed. The orbits are otherwise unremarkable. Sinuses: The paranasal sinuses are clear. Soft tissues: Mild left preseptal and infraorbital soft tissue swelling. CT CERVICAL SPINE FINDINGS Alignment: 2 mm anterolisthesis of C3 upon C4 is unchanged. Skull base and vertebrae: Craniocervical alignment is normal. Atlantodental interval is not widened. No acute fracture the cervical spine. Vertebral body height has been preserved. Soft tissues and spinal canal: No prevertebral fluid or swelling. No visible canal hematoma. Disc levels: Intervertebral disc space narrowing and endplate remodeling at O6-7 and C6-7 is in keeping with changes of moderate degenerative disc disease. Minimal degenerative changes are seen throughout the remainder of the cervical spine. The prevertebral soft tissues are not thickened on sagittal reformats. Mild central canal stenosis at C3-4 secondary to anterolisthesis and associated posterior disc osteophyte complex. Review of the axial images demonstrates multilevel uncovertebral and facet arthrosis resulting in moderate left and mild right neuroforaminal narrowing at C3-4 and C4-5. Other: None CT CHEST FINDINGS Cardiovascular: Moderate coronary artery calcification. Global cardiac size within normal limits. No pericardial effusion. Central pulmonary arteries are of normal caliber. The thoracic aorta is unremarkable. Left subclavian dual lead pacemaker is in place with leads within the right atrium and right ventricle. Left internal jugular chest port tip is seen within the superior vena cava. Mediastinum/Nodes: No enlarged mediastinal, hilar, or axillary lymph nodes. Thyroid gland, trachea, and esophagus demonstrate no significant findings. Lungs/Pleura: Mild basilar predominant pulmonary fibrotic change. No focal pulmonary infiltrate. No pneumothorax or pleural effusion. No central obstructing mass. Musculoskeletal: An acute minimally displaced  fracture of the left seventh rib is seen laterally. Multiple healed bilateral rib fractures are noted. CT ABDOMEN AND PELVIS FINDINGS Hepatobiliary: No focal liver abnormality is seen. Status post cholecystectomy. No biliary dilatation. Pancreas: Unremarkable Spleen: Unremarkable Adrenals/Urinary Tract: Adrenal glands are unremarkable. Status post right nephrectomy. Left kidney is normal in size and position. Stable probable hyperdense renal cyst arising from the lower pole of the left kidney. Left kidney is otherwise unremarkable. Bladder unremarkable. Stomach/Bowel: The stomach, small bowel, and large bowel are unremarkable. No free intraperitoneal gas or fluid. Vascular/Lymphatic: Aortic atherosclerosis. No enlarged abdominal or pelvic lymph nodes. Reproductive: Mild prostatic enlargement. Other: Tiny left fat containing inguinal hernia Musculoskeletal: No acute bone abnormality within the abdomen and pelvis. Osseous structures are age-appropriate. IMPRESSION: No acute intracranial injury. No calvarial fracture. Small left frontal  scalp hematoma. No acute facial fracture. Mild left preseptal and infraorbital soft tissue swelling. No acute fracture or listhesis of the cervical spine. Acute left seventh rib fracture.  No pneumothorax. No acute intra-abdominal pathology. Additional incidental findings as noted above. Aortic Atherosclerosis (ICD10-I70.0).  Fall Electronically Signed   By: Fidela Salisbury M.D.   On: 08/29/2021 02:56   CT CHEST ABDOMEN PELVIS W CONTRAST  Result Date: 08/29/2021 CLINICAL DATA:  Altered mental status, Unwitnessed fall, left rib pain EXAM: CT HEAD WITHOUT CONTRAST CT MAXILLOFACIAL WITHOUT CONTRAST CT CERVICAL SPINE WITHOUT CONTRAST CT CHEST, ABDOMEN AND PELVIS WITH CONTRAST TECHNIQUE: Contiguous axial images were obtained from the base of the skull through the vertex without intravenous contrast. Multidetector CT imaging of the maxillofacial structures was performed. Multiplanar CT  image reconstructions were also generated. A small metallic BB was placed on the right temple in order to reliably differentiate right from left. Multidetector CT imaging of the cervical spine was performed without intravenous contrast. Multiplanar CT image reconstructions were also generated. Multidetector CT imaging of the chest, abdomen and pelvis was performed following the standard protocol during bolus administration of intravenous contrast. CONTRAST:  142mL OMNIPAQUE IOHEXOL 300 MG/ML  SOLN COMPARISON:  08/25/2021, 07/29/2020 FINDINGS: CT HEAD FINDINGS Brain: Normal anatomic configuration. Parenchymal volume loss is commensurate with the patient's age. Mild periventricular white matter changes are present likely reflecting the sequela of small vessel ischemia. No abnormal intra or extra-axial mass lesion or fluid collection. No abnormal mass effect or midline shift. No evidence of acute intracranial hemorrhage or infarct. Ventricular size is normal. Cerebellum unremarkable. Vascular: No asymmetric hyperdense vasculature at the skull base. Skull: Intact Other: Mastoid air cells and middle ear cavities are clear. Small left frontal scalp hematoma noted. CT MAXILLOFACIAL FINDINGS Osseous: No fracture or mandibular dislocation. No destructive process. Orbits: Ocular lenses have been removed. The orbits are otherwise unremarkable. Sinuses: The paranasal sinuses are clear. Soft tissues: Mild left preseptal and infraorbital soft tissue swelling. CT CERVICAL SPINE FINDINGS Alignment: 2 mm anterolisthesis of C3 upon C4 is unchanged. Skull base and vertebrae: Craniocervical alignment is normal. Atlantodental interval is not widened. No acute fracture the cervical spine. Vertebral body height has been preserved. Soft tissues and spinal canal: No prevertebral fluid or swelling. No visible canal hematoma. Disc levels: Intervertebral disc space narrowing and endplate remodeling at I1-4 and C6-7 is in keeping with changes  of moderate degenerative disc disease. Minimal degenerative changes are seen throughout the remainder of the cervical spine. The prevertebral soft tissues are not thickened on sagittal reformats. Mild central canal stenosis at C3-4 secondary to anterolisthesis and associated posterior disc osteophyte complex. Review of the axial images demonstrates multilevel uncovertebral and facet arthrosis resulting in moderate left and mild right neuroforaminal narrowing at C3-4 and C4-5. Other: None CT CHEST FINDINGS Cardiovascular: Moderate coronary artery calcification. Global cardiac size within normal limits. No pericardial effusion. Central pulmonary arteries are of normal caliber. The thoracic aorta is unremarkable. Left subclavian dual lead pacemaker is in place with leads within the right atrium and right ventricle. Left internal jugular chest port tip is seen within the superior vena cava. Mediastinum/Nodes: No enlarged mediastinal, hilar, or axillary lymph nodes. Thyroid gland, trachea, and esophagus demonstrate no significant findings. Lungs/Pleura: Mild basilar predominant pulmonary fibrotic change. No focal pulmonary infiltrate. No pneumothorax or pleural effusion. No central obstructing mass. Musculoskeletal: An acute minimally displaced fracture of the left seventh rib is seen laterally. Multiple healed bilateral rib fractures are noted. CT ABDOMEN AND  PELVIS FINDINGS Hepatobiliary: No focal liver abnormality is seen. Status post cholecystectomy. No biliary dilatation. Pancreas: Unremarkable Spleen: Unremarkable Adrenals/Urinary Tract: Adrenal glands are unremarkable. Status post right nephrectomy. Left kidney is normal in size and position. Stable probable hyperdense renal cyst arising from the lower pole of the left kidney. Left kidney is otherwise unremarkable. Bladder unremarkable. Stomach/Bowel: The stomach, small bowel, and large bowel are unremarkable. No free intraperitoneal gas or fluid.  Vascular/Lymphatic: Aortic atherosclerosis. No enlarged abdominal or pelvic lymph nodes. Reproductive: Mild prostatic enlargement. Other: Tiny left fat containing inguinal hernia Musculoskeletal: No acute bone abnormality within the abdomen and pelvis. Osseous structures are age-appropriate. IMPRESSION: No acute intracranial injury. No calvarial fracture. Small left frontal scalp hematoma. No acute facial fracture. Mild left preseptal and infraorbital soft tissue swelling. No acute fracture or listhesis of the cervical spine. Acute left seventh rib fracture.  No pneumothorax. No acute intra-abdominal pathology. Additional incidental findings as noted above. Aortic Atherosclerosis (ICD10-I70.0).  Fall Electronically Signed   By: Fidela Salisbury M.D.   On: 08/29/2021 02:56   CT Maxillofacial Wo Contrast  Result Date: 08/29/2021 CLINICAL DATA:  Altered mental status, Unwitnessed fall, left rib pain EXAM: CT HEAD WITHOUT CONTRAST CT MAXILLOFACIAL WITHOUT CONTRAST CT CERVICAL SPINE WITHOUT CONTRAST CT CHEST, ABDOMEN AND PELVIS WITH CONTRAST TECHNIQUE: Contiguous axial images were obtained from the base of the skull through the vertex without intravenous contrast. Multidetector CT imaging of the maxillofacial structures was performed. Multiplanar CT image reconstructions were also generated. A small metallic BB was placed on the right temple in order to reliably differentiate right from left. Multidetector CT imaging of the cervical spine was performed without intravenous contrast. Multiplanar CT image reconstructions were also generated. Multidetector CT imaging of the chest, abdomen and pelvis was performed following the standard protocol during bolus administration of intravenous contrast. CONTRAST:  149mL OMNIPAQUE IOHEXOL 300 MG/ML  SOLN COMPARISON:  08/25/2021, 07/29/2020 FINDINGS: CT HEAD FINDINGS Brain: Normal anatomic configuration. Parenchymal volume loss is commensurate with the patient's age. Mild  periventricular white matter changes are present likely reflecting the sequela of small vessel ischemia. No abnormal intra or extra-axial mass lesion or fluid collection. No abnormal mass effect or midline shift. No evidence of acute intracranial hemorrhage or infarct. Ventricular size is normal. Cerebellum unremarkable. Vascular: No asymmetric hyperdense vasculature at the skull base. Skull: Intact Other: Mastoid air cells and middle ear cavities are clear. Small left frontal scalp hematoma noted. CT MAXILLOFACIAL FINDINGS Osseous: No fracture or mandibular dislocation. No destructive process. Orbits: Ocular lenses have been removed. The orbits are otherwise unremarkable. Sinuses: The paranasal sinuses are clear. Soft tissues: Mild left preseptal and infraorbital soft tissue swelling. CT CERVICAL SPINE FINDINGS Alignment: 2 mm anterolisthesis of C3 upon C4 is unchanged. Skull base and vertebrae: Craniocervical alignment is normal. Atlantodental interval is not widened. No acute fracture the cervical spine. Vertebral body height has been preserved. Soft tissues and spinal canal: No prevertebral fluid or swelling. No visible canal hematoma. Disc levels: Intervertebral disc space narrowing and endplate remodeling at P5-9 and C6-7 is in keeping with changes of moderate degenerative disc disease. Minimal degenerative changes are seen throughout the remainder of the cervical spine. The prevertebral soft tissues are not thickened on sagittal reformats. Mild central canal stenosis at C3-4 secondary to anterolisthesis and associated posterior disc osteophyte complex. Review of the axial images demonstrates multilevel uncovertebral and facet arthrosis resulting in moderate left and mild right neuroforaminal narrowing at C3-4 and C4-5. Other: None CT CHEST FINDINGS  Cardiovascular: Moderate coronary artery calcification. Global cardiac size within normal limits. No pericardial effusion. Central pulmonary arteries are of normal  caliber. The thoracic aorta is unremarkable. Left subclavian dual lead pacemaker is in place with leads within the right atrium and right ventricle. Left internal jugular chest port tip is seen within the superior vena cava. Mediastinum/Nodes: No enlarged mediastinal, hilar, or axillary lymph nodes. Thyroid gland, trachea, and esophagus demonstrate no significant findings. Lungs/Pleura: Mild basilar predominant pulmonary fibrotic change. No focal pulmonary infiltrate. No pneumothorax or pleural effusion. No central obstructing mass. Musculoskeletal: An acute minimally displaced fracture of the left seventh rib is seen laterally. Multiple healed bilateral rib fractures are noted. CT ABDOMEN AND PELVIS FINDINGS Hepatobiliary: No focal liver abnormality is seen. Status post cholecystectomy. No biliary dilatation. Pancreas: Unremarkable Spleen: Unremarkable Adrenals/Urinary Tract: Adrenal glands are unremarkable. Status post right nephrectomy. Left kidney is normal in size and position. Stable probable hyperdense renal cyst arising from the lower pole of the left kidney. Left kidney is otherwise unremarkable. Bladder unremarkable. Stomach/Bowel: The stomach, small bowel, and large bowel are unremarkable. No free intraperitoneal gas or fluid. Vascular/Lymphatic: Aortic atherosclerosis. No enlarged abdominal or pelvic lymph nodes. Reproductive: Mild prostatic enlargement. Other: Tiny left fat containing inguinal hernia Musculoskeletal: No acute bone abnormality within the abdomen and pelvis. Osseous structures are age-appropriate. IMPRESSION: No acute intracranial injury. No calvarial fracture. Small left frontal scalp hematoma. No acute facial fracture. Mild left preseptal and infraorbital soft tissue swelling. No acute fracture or listhesis of the cervical spine. Acute left seventh rib fracture.  No pneumothorax. No acute intra-abdominal pathology. Additional incidental findings as noted above. Aortic Atherosclerosis  (ICD10-I70.0).  Fall Electronically Signed   By: Fidela Salisbury M.D.   On: 08/29/2021 02:56    ____________________________________________   PROCEDURES  Procedure(s) performed (including Critical Care):  Procedures  CRITICAL CARE Performed by: Cyril Mourning Sharin Altidor   Total critical care time: 45 minutes  Critical care time was exclusive of separately billable procedures and treating other patients.  Critical care was necessary to treat or prevent imminent or life-threatening deterioration.  Critical care was time spent personally by me on the following activities: development of treatment plan with patient and/or surrogate as well as nursing, discussions with consultants, evaluation of patient's response to treatment, examination of patient, obtaining history from patient or surrogate, ordering and performing treatments and interventions, ordering and review of laboratory studies, ordering and review of radiographic studies, pulse oximetry and re-evaluation of patient's condition.  ____________________________________________   INITIAL IMPRESSION / ASSESSMENT AND PLAN / ED COURSE  As part of my medical decision making, I reviewed the following data within the [facility] [place] notes reviewed and incorporated, Labs reviewed , EKG interpreted , Old EKG reviewed, Old chart reviewed, Discussed with admitting physician , CTs reviewed, and Notes from prior ED visits         Patient here with altered mental status, frequent falls, left-sided chest pain.  Patient is covered in areas of ecchymosis and abrasions.  He is unable to provide much history here.  Blood glucose currently normal.  Will obtain CT imaging of his head, face, spine, chest and abdomen pelvis.  Will give pain medication.  We will also obtain cardiac work-up given complaints of chest pain although it seems musculoskeletal.  We will obtain screening labs and urine to ensure no anemia, electrolyte derangement,  thyroid dysfunction, intoxication, infection that could be causing any of his symptoms and altered mental status.  ED PROGRESS  Patient's blood sugar was rechecked and has dropped to 19.  His insulin pump was turned off immediately upon arrival.  Given D50 and we will start him on a dextrose infusion.  His previous endocrinology notes state that he has had episodes of severe hypoglycemia before.  I suspect that there is also some component of failure to thrive and depression due to the loss of his wife recently.  Per EMS, patient has very poor living conditions with a hoarding type situation and EMS was planning to contact APS.  Patient tells me that he lives at home alone but does have a sister locally.   CT imaging shows that he has an acute seventh left rib fracture which is likely the cause of his pain.  His EKG is nonischemic and his troponin is negative.  His pacemaker has been interrogated and we are waiting the report.   Labs show chronic kidney disease which appears stable.  He does have a mild leukocytosis which may be reactive.  He has no infectious symptoms.  Alcohol level negative.  Ammonia normal.  TSH normal.  Urine shows no sign of infection.   Patient's mental status seems to be improving now that his glucose is improving on a dextrose infusion.  CT of the head shows no acute abnormality.  No other sign of traumatic injury on CT imaging.     We have been able to get the patient up using a walker with some difficulty.  He states he normally gets around with a cane at home.  I have discussed with patient that I do not feel that he is safe to go back to his home in his condition and he seems to be very apprehensive about a nursing facility but agrees to admission to the hospital at this time for his hypoglycemia and for pain control, pulmonary toilet for his acute rib fracture.    4:23 AM  Discussed patient's case with hospitalist, Dr. Sidney Ace.  I have recommended admission and  patient (and family if present) agree with this plan. Admitting physician will place admission orders.   I reviewed all nursing notes, vitals, pertinent previous records and reviewed/interpreted all EKGs, lab and urine results, imaging (as available).   Patient's blood sugar is up to 269.  We have stopped his D10 infusion.  Rechecked blood sugar 1 hour later and is 291. ____________________________________________   FINAL CLINICAL IMPRESSION(S) / ED DIAGNOSES  Final diagnoses:  Fall  Closed fracture of one rib of left side, initial encounter  Hypoglycemia  Generalized weakness  FTT (failure to thrive) in adult     ED Discharge Orders     None       *Please note:  Gary [name] was evaluated in Emergency Department on 08/29/2021 for the symptoms described in the history of present illness. He was evaluated in the context of the global COVID-19 pandemic, which necessitated consideration that the patient might be at risk for infection with the SARS-CoV-2 virus that causes COVID-19. Institutional protocols and algorithms that pertain to the evaluation of patients at risk for COVID-19 are in a state of rapid change based on information released by regulatory bodies including the CDC and federal and state organizations. These policies and algorithms were followed during the patient's care in the ED.  Some ED evaluations and interventions may be delayed as a result of limited staffing during and the pandemic.*   Note:  This document was prepared using Dragon voice recognition software and may include unintentional  dictation errors.    Lamoine Fredricksen, Delice Bison, DO 08/29/21 0441    Vania Rosero, Delice Bison, DO 08/29/21 [id number]

## 2021-08-29 NOTE — Progress Notes (Signed)
MD- Given pt's current state and severe hypoglycemia on admission, do not recommend resuming insulin pump in hospital.    Will need to re-evaluate prior to discharge to see if pt has the physical capability to self-manage insulin pump.  Currently he appears very weak.    Met w/ pt down in the ED around 11am.  RN was giving 15 units Semglee at that time.  Pt A&O, however, he appears very weak and had multiple bruises on his face and head.  Discussed with pt that we are planning to keep his insulin pump off at this time and will be giving him Semglee and Novolog insulins to replace the insulin he gets on his pump.  Explained what Semglee is and how much we are giving him this AM.  Pt agreeable to SQ Insulin in the hospital.  Pump and Dexcom CGM sensor in a bag along with his cross and rosary at bedside.    Pt stated he has not made any changes to his insulin pump settings since his last visit with ENDO on 08/12/2021.  Did tell me that he called the insulin pump company and his warranty on his current insulin pump is not out until November 2023 so he cannot get a new pump (Tandem) as [employer] wants him to until Nov 2023.  Due for Novolog at 12pm today-_Semglee on board at [facility] [place].   Will follow    --Will follow patient during hospitalization--  Wyn Quaker RN, MSN, CDE Diabetes Coordinator Inpatient Glycemic Control Team Team Pager: [phone] (8a-5p)

## 2021-08-29 NOTE — ED Notes (Signed)
Pt sister Manuela Schwartz called to leave phone number.  [employer] # [phone]

## 2021-08-29 NOTE — ED Triage Notes (Signed)
Pt arrived via [facility] from home where he lives alone with dog due to recent loss of spouse. EMS reports pts neighbor called in, unknown reason. Pt reported to EMS he could not breath. Pt with intermittent episodes of AMS and drowsiness. Pt seen in ED on 11/14 for fall. EMS reports that [employer] made an APS report tonight due to recent repeated calls to home for falls and living conditions. Pt reports left lower rib cage pain as well as difficulty breathing. Room air sat on arrival 99%. Multiple stages of bruising all over pts body. Stitches noted to the left hand as well as bilateral wound dressing to feet.

## 2021-08-29 NOTE — ED Notes (Signed)
[employer] pacemaker interrogated and awaiting fax report

## 2021-08-29 NOTE — ED Notes (Signed)
Pt given cell phone and on the phone with sister at this time.

## 2021-08-29 NOTE — ED Notes (Signed)
This RN messaged MD Mansy at this time in regards to pt's BGL of 337 and BP of 99/52(65). No further orders received at this time.

## 2021-08-30 DIAGNOSIS — E162 Hypoglycemia, unspecified: Secondary | ICD-10-CM | POA: Diagnosis not present

## 2021-08-30 LAB — CBC
HCT: 35 % — ABNORMAL LOW (ref 39.0–52.0)
Hemoglobin: 11.7 g/dL — ABNORMAL LOW (ref 13.0–17.0)
MCH: 31.4 pg (ref 26.0–34.0)
MCHC: 33.4 g/dL (ref 30.0–36.0)
MCV: 93.8 fL (ref 80.0–100.0)
Platelets: 123 10*3/uL — ABNORMAL LOW (ref 150–400)
RBC: 3.73 MIL/uL — ABNORMAL LOW (ref 4.22–5.81)
RDW: 13.4 % (ref 11.5–15.5)
WBC: 6.4 10*3/uL (ref 4.0–10.5)
nRBC: 0 % (ref 0.0–0.2)

## 2021-08-30 LAB — BASIC METABOLIC PANEL WITH GFR
Anion gap: 10 (ref 5–15)
BUN: 38 mg/dL — ABNORMAL HIGH (ref 8–23)
CO2: 20 mmol/L — ABNORMAL LOW (ref 22–32)
Calcium: 7.7 mg/dL — ABNORMAL LOW (ref 8.9–10.3)
Chloride: 108 mmol/L (ref 98–111)
Creatinine, Ser: 1.59 mg/dL — ABNORMAL HIGH (ref 0.61–1.24)
GFR, Estimated: 46 mL/min — ABNORMAL LOW
Glucose, Bld: 178 mg/dL — ABNORMAL HIGH (ref 70–99)
Potassium: 3.8 mmol/L (ref 3.5–5.1)
Sodium: 138 mmol/L (ref 135–145)

## 2021-08-30 LAB — GLUCOSE, CAPILLARY
Glucose-Capillary: 158 mg/dL — ABNORMAL HIGH (ref 70–99)
Glucose-Capillary: 255 mg/dL — ABNORMAL HIGH (ref 70–99)
Glucose-Capillary: 222 mg/dL — ABNORMAL HIGH (ref 70–99)
Glucose-Capillary: 184 mg/dL — ABNORMAL HIGH (ref 70–99)

## 2021-08-30 MED ORDER — MORPHINE SULFATE (PF) 2 MG/ML IV SOLN: 1.0000 mg | INTRAVENOUS | Status: DC | PRN

## 2021-08-30 NOTE — Progress Notes (Signed)
PROGRESS NOTE    Gary [name]  DGU:[id number] DOB: [birth year] DOA: 08/29/2021 PCP: [name], MD    Brief Narrative:  75 y.o. male with medical history significant of type 1 diabetes on insulin pump, hypertension, hyperlipidemia, COPD, GERD, hypothyroidism, depression, peripheral neuropathy, gastroparesis, mantle cell lymphoma, hypothyroidism, Graves' disease, CHF, s/p of nephrectomy, CAD, CKD-3A, SSS, s/p of pacemaker placement, iron deficiency anemia, lymphedema, who presents with fall.   Pt is a poor historian.  History is limited.  Per report, patient recently lost his wife.  He lives alone at home.  Patient fell yesterday, and injured his left chest wall.  Complains of left rib cage pain.  He states that he cannot take deep breaths because of pain.  Initially patient was drowsy, seems to be altered, but when I saw patient in the ED, he is alert, oriented x3.  He moves all extremities normally.  No facial droop or slurred speech.  He states that he has mild shortness of breath and left side of chest wall pain.  No cough, fever or chills.  Denies nausea vomiting, diarrhea or abdominal pain.  No symptoms of UTI.  Patient has lymphedema and venous ulcer in legs. Both legs are wrapped up.  Of note, patient had fall and was seen in ED on 11/14. He had skin laceration in left hand which was sutured up. He had unremarkable CTs of his head, face and cervical spine that time.   Patient was found to have hypoglycemia with blood sugar 19, insulin pump with discontinued.  Seen in consultation by diabetes coordinator.  Recommendations appreciated.  At this time insulin pump has been discontinued.  Patient on basal bolus subcutaneous insulin regimen.  Mental status is improving.   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   Left rib fracture_7th rib   Leukocytosis   HTN (hypertension)   Hypothyroid   CAD (coronary artery disease)  Severe symptomatic hypoglycemia blood sugar was 19 on admission Insulin pump was discontinued Unclear nature, question pulmonary function Patient was treated with D50 and D10 gtt Diabetes coordinator consulted Plan: Basal bolus regimen Continue holding insulin pump Patient diabetes coordinator consult Accu-Cheks before meals and at bedtime As needed D50 for persistent hypoglycemia   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  Troponins unremarkable -Continue pravastatin -As needed EKG chest pain   Hypercholesterolemia -Pravastatin   Fall-recurrent -PT/OT, evaluation pending -Fall precaution   Iron deficiency anemia  Hemoglobin stable, 13.7 -Continue iron supplement   CKD (chronic kidney disease), stage IIIa  Close to baseline.  Baseline creatinine 1.2-1.4 Avoid nephrotoxins -Follow-up with BMP   Chronic systolic CHF (congestive heart failure) (HCC)  BNP 198, no respiratory distress.   No JVD, does not seem to have CHF exacerbation -Hold Lasix due to softer blood pressure, restart as appropriate   Hypokalemia  Potassium 3.1, repleted -Monitor and replace as necessary   Left rib fracture_7th rib -Pain control: As needed Percocet, Tylenol, morphine -Incentive spirometry   Leukocytosis No source of infection identified.   No fever.  Urinalysis negative.  Likely  reactive    HTN (hypertension)  Blood pressure is soft -IV hydralazine as needed -Hold Coreg, Lasix, restart as appropriate   Hypothyroid -Continue Synthroid   CAD (coronary artery disease): -Continue pravastatin   Skin laceration in left  hand: -Wound care consult, recommendations pending   DVT prophylaxis: SCD Code Status: Full Family Communication:Sister Manuela Schwartz [phone] Disposition Plan: Status is: Inpatient  Remains inpatient appropriate because: Symptomatic severe hypoglycemic episode and resultant fall       Level of care: Med-Surg  Consultants:  Diabetes coordinator  Procedures:  None  Antimicrobials: None    Subjective: Seen and examined.  Still sleepy and in pain, but alert and oriented x 3  Objective: Vitals:   08/30/21 0309 08/30/21 0353 08/30/21 0413 08/30/21 0814  BP: (!) 99/45 118/77 127/68 (!) 104/46  Pulse: 74 77 81 71  Resp: 17 20 18 18   Temp: 98 F (36.7 C)  97.8 F (36.6 C) 97.8 F (36.6 C)  TempSrc: Oral     SpO2: 98% 99% 100% 98%  Weight:   102.1 kg   Height:   5\' 11"  (1.803 m)     Intake/Output Summary (Last 24 hours) at 08/30/2021 8786 Last data filed at 08/29/2021 2149 Gross per 24 hour  Intake 120 ml  Output 250 ml  Net -130 ml   Filed Weights   08/30/21 0413  Weight: 102.1 kg    Examination:  General exam: Lethargic, appears fatigued Respiratory system: Bibasilar crackles Cardiovascular system: S1S2, RRR, no murmurs, non pitting BL LE edema Gastrointestinal system: Soft, NT/ND, normal bowel sounds Central nervous system: Alert and oriented. No focal neurological deficits. Extremities: Diffusely decreased power BLE Skin: Bilateral periorbital ecchymoses Psychiatry: Judgement and insight appear normal. Mood & affect appropriate.     Data Reviewed: I have personally reviewed following labs and imaging studies  CBC: Recent Labs  Lab 08/29/21 0020 08/30/21 0514  WBC 12.2* 6.4  HGB 13.7 11.7*  HCT 41.7 35.0*  MCV 93.3 93.8  PLT 170 767*   Basic Metabolic Panel: Recent Labs  Lab 08/29/21 0020 08/30/21 0514  NA 145 138  K 3.1* 3.8  CL 107 108  CO2 25 20*  GLUCOSE 43* 178*  BUN 28* 38*  CREATININE 1.46* 1.59*  CALCIUM 8.5* 7.7*  MG 2.0   --    GFR: Estimated Creatinine Clearance: 50.3 mL/min (A) (by C-G formula based on SCr of 1.59 mg/dL (H)). Liver Function Tests: Recent Labs  Lab 08/29/21 0020  AST 17  ALT 7  ALKPHOS 108  BILITOT 0.5  PROT 7.2  ALBUMIN 3.7   No results for input(s): LIPASE, AMYLASE in the last 168 hours. Recent Labs  Lab 08/29/21 0020  AMMONIA 19   Coagulation Profile: Recent Labs  Lab 08/29/21 0020  INR 1.2   Cardiac Enzymes: No results for input(s): CKTOTAL, CKMB, CKMBINDEX, TROPONINI in the last 168 hours. BNP (last 3 results) No results for input(s): PROBNP in the last 8760 hours. HbA1C: No results for input(s): HGBA1C in the last 72 hours. CBG: Recent Labs  Lab 08/29/21 0951 08/29/21 1238 08/29/21 1637 08/29/21 2156 08/30/21 0815  GLUCAP 332* 325* 320* 182* 158*   Lipid Profile: No results for input(s): CHOL, HDL, LDLCALC, TRIG, CHOLHDL, LDLDIRECT in the last 72 hours. Thyroid Function Tests: Recent Labs    08/29/21 0020  TSH 2.066   Anemia Panel: No results for input(s): VITAMINB12, FOLATE, FERRITIN, TIBC, IRON, RETICCTPCT in the last 72 hours. Sepsis Labs: No results for input(s): PROCALCITON, LATICACIDVEN  in the last 168 hours.  Recent Results (from the past 240 hour(s))  Resp Panel by RT-PCR (Flu A&B, Covid) Nasopharyngeal Swab     Status: None   Collection Time: 08/29/21 11:04 AM   Specimen: Nasopharyngeal Swab; Nasopharyngeal(NP) swabs in vial transport medium  Result Value Ref Range Status   SARS Coronavirus 2 by RT PCR NEGATIVE NEGATIVE Final    Comment: (NOTE) SARS-CoV-2 target nucleic acids are NOT DETECTED.  The SARS-CoV-2 RNA is generally detectable in upper respiratory specimens during the acute phase of infection. The lowest concentration of SARS-CoV-2 viral copies this assay can detect is 138 copies/mL. A negative result does not preclude SARS-Cov-2 infection and should not be used as the sole basis for treatment or other patient management  decisions. A negative result may occur with  improper specimen collection/handling, submission of specimen other than nasopharyngeal swab, presence of viral mutation(s) within the areas targeted by this assay, and inadequate number of viral copies(<138 copies/mL). A negative result must be combined with clinical observations, patient history, and epidemiological information. The expected result is Negative.  Fact Sheet for Patients:  EntrepreneurPulse.com.au  Fact Sheet for Healthcare Providers:  IncredibleEmployment.be  This test is no t yet approved or cleared by the [place] FDA and  has been authorized for detection and/or diagnosis of SARS-CoV-2 by FDA under an Emergency Use Authorization (EUA). This EUA will remain  in effect (meaning this test can be used) for the duration of the COVID-19 declaration under Section 564(b)(1) of the Act, 21 U.S.C.section 360bbb-3(b)(1), unless the authorization is terminated  or revoked sooner.       Influenza A by PCR NEGATIVE NEGATIVE Final   Influenza B by PCR NEGATIVE NEGATIVE Final    Comment: (NOTE) The Xpert Xpress SARS-CoV-2/FLU/RSV plus assay is intended as an aid in the diagnosis of influenza from Nasopharyngeal swab specimens and should not be used as a sole basis for treatment. Nasal washings and aspirates are unacceptable for Xpert Xpress SARS-CoV-2/FLU/RSV testing.  Fact Sheet for Patients: EntrepreneurPulse.com.au  Fact Sheet for Healthcare Providers: IncredibleEmployment.be  This test is not yet approved or cleared by the [place] FDA and has been authorized for detection and/or diagnosis of SARS-CoV-2 by FDA under an Emergency Use Authorization (EUA). This EUA will remain in effect (meaning this test can be used) for the duration of the COVID-19 declaration under Section 564(b)(1) of the Act, 21 U.S.C. section 360bbb-3(b)(1), unless the  authorization is terminated or revoked.  Performed at [facility], [address] [place], [place] [address]          Radiology Studies: CT HEAD WO CONTRAST (5MM)  Result Date: 08/29/2021 CLINICAL DATA:  Altered mental status, Unwitnessed fall, left rib pain EXAM: CT HEAD WITHOUT CONTRAST CT MAXILLOFACIAL WITHOUT CONTRAST CT CERVICAL SPINE WITHOUT CONTRAST CT CHEST, ABDOMEN AND PELVIS WITH CONTRAST TECHNIQUE: Contiguous axial images were obtained from the base of the skull through the vertex without intravenous contrast. Multidetector CT imaging of the maxillofacial structures was performed. Multiplanar CT image reconstructions were also generated. A small metallic BB was placed on the right temple in order to reliably differentiate right from left. Multidetector CT imaging of the cervical spine was performed without intravenous contrast. Multiplanar CT image reconstructions were also generated. Multidetector CT imaging of the chest, abdomen and pelvis was performed following the standard protocol during bolus administration of intravenous contrast. CONTRAST:  172mL OMNIPAQUE IOHEXOL 300 MG/ML  SOLN COMPARISON:  08/25/2021, 07/29/2020 FINDINGS: CT HEAD FINDINGS Brain: Normal  anatomic configuration. Parenchymal volume loss is commensurate with the patient's age. Mild periventricular white matter changes are present likely reflecting the sequela of small vessel ischemia. No abnormal intra or extra-axial mass lesion or fluid collection. No abnormal mass effect or midline shift. No evidence of acute intracranial hemorrhage or infarct. Ventricular size is normal. Cerebellum unremarkable. Vascular: No asymmetric hyperdense vasculature at the skull base. Skull: Intact Other: Mastoid air cells and middle ear cavities are clear. Small left frontal scalp hematoma noted. CT MAXILLOFACIAL FINDINGS Osseous: No fracture or mandibular dislocation. No destructive process. Orbits: Ocular lenses have been  removed. The orbits are otherwise unremarkable. Sinuses: The paranasal sinuses are clear. Soft tissues: Mild left preseptal and infraorbital soft tissue swelling. CT CERVICAL SPINE FINDINGS Alignment: 2 mm anterolisthesis of C3 upon C4 is unchanged. Skull base and vertebrae: Craniocervical alignment is normal. Atlantodental interval is not widened. No acute fracture the cervical spine. Vertebral body height has been preserved. Soft tissues and spinal canal: No prevertebral fluid or swelling. No visible canal hematoma. Disc levels: Intervertebral disc space narrowing and endplate remodeling at E2-6 and C6-7 is in keeping with changes of moderate degenerative disc disease. Minimal degenerative changes are seen throughout the remainder of the cervical spine. The prevertebral soft tissues are not thickened on sagittal reformats. Mild central canal stenosis at C3-4 secondary to anterolisthesis and associated posterior disc osteophyte complex. Review of the axial images demonstrates multilevel uncovertebral and facet arthrosis resulting in moderate left and mild right neuroforaminal narrowing at C3-4 and C4-5. Other: None CT CHEST FINDINGS Cardiovascular: Moderate coronary artery calcification. Global cardiac size within normal limits. No pericardial effusion. Central pulmonary arteries are of normal caliber. The thoracic aorta is unremarkable. Left subclavian dual lead pacemaker is in place with leads within the right atrium and right ventricle. Left internal jugular chest port tip is seen within the superior vena cava. Mediastinum/Nodes: No enlarged mediastinal, hilar, or axillary lymph nodes. Thyroid gland, trachea, and esophagus demonstrate no significant findings. Lungs/Pleura: Mild basilar predominant pulmonary fibrotic change. No focal pulmonary infiltrate. No pneumothorax or pleural effusion. No central obstructing mass. Musculoskeletal: An acute minimally displaced fracture of the left seventh rib is seen  laterally. Multiple healed bilateral rib fractures are noted. CT ABDOMEN AND PELVIS FINDINGS Hepatobiliary: No focal liver abnormality is seen. Status post cholecystectomy. No biliary dilatation. Pancreas: Unremarkable Spleen: Unremarkable Adrenals/Urinary Tract: Adrenal glands are unremarkable. Status post right nephrectomy. Left kidney is normal in size and position. Stable probable hyperdense renal cyst arising from the lower pole of the left kidney. Left kidney is otherwise unremarkable. Bladder unremarkable. Stomach/Bowel: The stomach, small bowel, and large bowel are unremarkable. No free intraperitoneal gas or fluid. Vascular/Lymphatic: Aortic atherosclerosis. No enlarged abdominal or pelvic lymph nodes. Reproductive: Mild prostatic enlargement. Other: Tiny left fat containing inguinal hernia Musculoskeletal: No acute bone abnormality within the abdomen and pelvis. Osseous structures are age-appropriate. IMPRESSION: No acute intracranial injury. No calvarial fracture. Small left frontal scalp hematoma. No acute facial fracture. Mild left preseptal and infraorbital soft tissue swelling. No acute fracture or listhesis of the cervical spine. Acute left seventh rib fracture.  No pneumothorax. No acute intra-abdominal pathology. Additional incidental findings as noted above. Aortic Atherosclerosis (ICD10-I70.0).  Fall Electronically Signed   By: Fidela Salisbury M.D.   On: 08/29/2021 02:56   CT Cervical Spine Wo Contrast  Result Date: 08/29/2021 CLINICAL DATA:  Altered mental status, Unwitnessed fall, left rib pain EXAM: CT HEAD WITHOUT CONTRAST CT MAXILLOFACIAL WITHOUT CONTRAST CT CERVICAL SPINE WITHOUT  CONTRAST CT CHEST, ABDOMEN AND PELVIS WITH CONTRAST TECHNIQUE: Contiguous axial images were obtained from the base of the skull through the vertex without intravenous contrast. Multidetector CT imaging of the maxillofacial structures was performed. Multiplanar CT image reconstructions were also generated. A  small metallic BB was placed on the right temple in order to reliably differentiate right from left. Multidetector CT imaging of the cervical spine was performed without intravenous contrast. Multiplanar CT image reconstructions were also generated. Multidetector CT imaging of the chest, abdomen and pelvis was performed following the standard protocol during bolus administration of intravenous contrast. CONTRAST:  173mL OMNIPAQUE IOHEXOL 300 MG/ML  SOLN COMPARISON:  08/25/2021, 07/29/2020 FINDINGS: CT HEAD FINDINGS Brain: Normal anatomic configuration. Parenchymal volume loss is commensurate with the patient's age. Mild periventricular white matter changes are present likely reflecting the sequela of small vessel ischemia. No abnormal intra or extra-axial mass lesion or fluid collection. No abnormal mass effect or midline shift. No evidence of acute intracranial hemorrhage or infarct. Ventricular size is normal. Cerebellum unremarkable. Vascular: No asymmetric hyperdense vasculature at the skull base. Skull: Intact Other: Mastoid air cells and middle ear cavities are clear. Small left frontal scalp hematoma noted. CT MAXILLOFACIAL FINDINGS Osseous: No fracture or mandibular dislocation. No destructive process. Orbits: Ocular lenses have been removed. The orbits are otherwise unremarkable. Sinuses: The paranasal sinuses are clear. Soft tissues: Mild left preseptal and infraorbital soft tissue swelling. CT CERVICAL SPINE FINDINGS Alignment: 2 mm anterolisthesis of C3 upon C4 is unchanged. Skull base and vertebrae: Craniocervical alignment is normal. Atlantodental interval is not widened. No acute fracture the cervical spine. Vertebral body height has been preserved. Soft tissues and spinal canal: No prevertebral fluid or swelling. No visible canal hematoma. Disc levels: Intervertebral disc space narrowing and endplate remodeling at X3-8 and C6-7 is in keeping with changes of moderate degenerative disc disease. Minimal  degenerative changes are seen throughout the remainder of the cervical spine. The prevertebral soft tissues are not thickened on sagittal reformats. Mild central canal stenosis at C3-4 secondary to anterolisthesis and associated posterior disc osteophyte complex. Review of the axial images demonstrates multilevel uncovertebral and facet arthrosis resulting in moderate left and mild right neuroforaminal narrowing at C3-4 and C4-5. Other: None CT CHEST FINDINGS Cardiovascular: Moderate coronary artery calcification. Global cardiac size within normal limits. No pericardial effusion. Central pulmonary arteries are of normal caliber. The thoracic aorta is unremarkable. Left subclavian dual lead pacemaker is in place with leads within the right atrium and right ventricle. Left internal jugular chest port tip is seen within the superior vena cava. Mediastinum/Nodes: No enlarged mediastinal, hilar, or axillary lymph nodes. Thyroid gland, trachea, and esophagus demonstrate no significant findings. Lungs/Pleura: Mild basilar predominant pulmonary fibrotic change. No focal pulmonary infiltrate. No pneumothorax or pleural effusion. No central obstructing mass. Musculoskeletal: An acute minimally displaced fracture of the left seventh rib is seen laterally. Multiple healed bilateral rib fractures are noted. CT ABDOMEN AND PELVIS FINDINGS Hepatobiliary: No focal liver abnormality is seen. Status post cholecystectomy. No biliary dilatation. Pancreas: Unremarkable Spleen: Unremarkable Adrenals/Urinary Tract: Adrenal glands are unremarkable. Status post right nephrectomy. Left kidney is normal in size and position. Stable probable hyperdense renal cyst arising from the lower pole of the left kidney. Left kidney is otherwise unremarkable. Bladder unremarkable. Stomach/Bowel: The stomach, small bowel, and large bowel are unremarkable. No free intraperitoneal gas or fluid. Vascular/Lymphatic: Aortic atherosclerosis. No enlarged  abdominal or pelvic lymph nodes. Reproductive: Mild prostatic enlargement. Other: Tiny left fat containing inguinal hernia  Musculoskeletal: No acute bone abnormality within the abdomen and pelvis. Osseous structures are age-appropriate. IMPRESSION: No acute intracranial injury. No calvarial fracture. Small left frontal scalp hematoma. No acute facial fracture. Mild left preseptal and infraorbital soft tissue swelling. No acute fracture or listhesis of the cervical spine. Acute left seventh rib fracture.  No pneumothorax. No acute intra-abdominal pathology. Additional incidental findings as noted above. Aortic Atherosclerosis (ICD10-I70.0).  Fall Electronically Signed   By: Fidela Salisbury M.D.   On: 08/29/2021 02:56   CT CHEST ABDOMEN PELVIS W CONTRAST  Result Date: 08/29/2021 CLINICAL DATA:  Altered mental status, Unwitnessed fall, left rib pain EXAM: CT HEAD WITHOUT CONTRAST CT MAXILLOFACIAL WITHOUT CONTRAST CT CERVICAL SPINE WITHOUT CONTRAST CT CHEST, ABDOMEN AND PELVIS WITH CONTRAST TECHNIQUE: Contiguous axial images were obtained from the base of the skull through the vertex without intravenous contrast. Multidetector CT imaging of the maxillofacial structures was performed. Multiplanar CT image reconstructions were also generated. A small metallic BB was placed on the right temple in order to reliably differentiate right from left. Multidetector CT imaging of the cervical spine was performed without intravenous contrast. Multiplanar CT image reconstructions were also generated. Multidetector CT imaging of the chest, abdomen and pelvis was performed following the standard protocol during bolus administration of intravenous contrast. CONTRAST:  162mL OMNIPAQUE IOHEXOL 300 MG/ML  SOLN COMPARISON:  08/25/2021, 07/29/2020 FINDINGS: CT HEAD FINDINGS Brain: Normal anatomic configuration. Parenchymal volume loss is commensurate with the patient's age. Mild periventricular white matter changes are present likely  reflecting the sequela of small vessel ischemia. No abnormal intra or extra-axial mass lesion or fluid collection. No abnormal mass effect or midline shift. No evidence of acute intracranial hemorrhage or infarct. Ventricular size is normal. Cerebellum unremarkable. Vascular: No asymmetric hyperdense vasculature at the skull base. Skull: Intact Other: Mastoid air cells and middle ear cavities are clear. Small left frontal scalp hematoma noted. CT MAXILLOFACIAL FINDINGS Osseous: No fracture or mandibular dislocation. No destructive process. Orbits: Ocular lenses have been removed. The orbits are otherwise unremarkable. Sinuses: The paranasal sinuses are clear. Soft tissues: Mild left preseptal and infraorbital soft tissue swelling. CT CERVICAL SPINE FINDINGS Alignment: 2 mm anterolisthesis of C3 upon C4 is unchanged. Skull base and vertebrae: Craniocervical alignment is normal. Atlantodental interval is not widened. No acute fracture the cervical spine. Vertebral body height has been preserved. Soft tissues and spinal canal: No prevertebral fluid or swelling. No visible canal hematoma. Disc levels: Intervertebral disc space narrowing and endplate remodeling at B3-4 and C6-7 is in keeping with changes of moderate degenerative disc disease. Minimal degenerative changes are seen throughout the remainder of the cervical spine. The prevertebral soft tissues are not thickened on sagittal reformats. Mild central canal stenosis at C3-4 secondary to anterolisthesis and associated posterior disc osteophyte complex. Review of the axial images demonstrates multilevel uncovertebral and facet arthrosis resulting in moderate left and mild right neuroforaminal narrowing at C3-4 and C4-5. Other: None CT CHEST FINDINGS Cardiovascular: Moderate coronary artery calcification. Global cardiac size within normal limits. No pericardial effusion. Central pulmonary arteries are of normal caliber. The thoracic aorta is unremarkable. Left  subclavian dual lead pacemaker is in place with leads within the right atrium and right ventricle. Left internal jugular chest port tip is seen within the superior vena cava. Mediastinum/Nodes: No enlarged mediastinal, hilar, or axillary lymph nodes. Thyroid gland, trachea, and esophagus demonstrate no significant findings. Lungs/Pleura: Mild basilar predominant pulmonary fibrotic change. No focal pulmonary infiltrate. No pneumothorax or pleural effusion. No central obstructing  mass. Musculoskeletal: An acute minimally displaced fracture of the left seventh rib is seen laterally. Multiple healed bilateral rib fractures are noted. CT ABDOMEN AND PELVIS FINDINGS Hepatobiliary: No focal liver abnormality is seen. Status post cholecystectomy. No biliary dilatation. Pancreas: Unremarkable Spleen: Unremarkable Adrenals/Urinary Tract: Adrenal glands are unremarkable. Status post right nephrectomy. Left kidney is normal in size and position. Stable probable hyperdense renal cyst arising from the lower pole of the left kidney. Left kidney is otherwise unremarkable. Bladder unremarkable. Stomach/Bowel: The stomach, small bowel, and large bowel are unremarkable. No free intraperitoneal gas or fluid. Vascular/Lymphatic: Aortic atherosclerosis. No enlarged abdominal or pelvic lymph nodes. Reproductive: Mild prostatic enlargement. Other: Tiny left fat containing inguinal hernia Musculoskeletal: No acute bone abnormality within the abdomen and pelvis. Osseous structures are age-appropriate. IMPRESSION: No acute intracranial injury. No calvarial fracture. Small left frontal scalp hematoma. No acute facial fracture. Mild left preseptal and infraorbital soft tissue swelling. No acute fracture or listhesis of the cervical spine. Acute left seventh rib fracture.  No pneumothorax. No acute intra-abdominal pathology. Additional incidental findings as noted above. Aortic Atherosclerosis (ICD10-I70.0).  Fall Electronically Signed   By:  Fidela Salisbury M.D.   On: 08/29/2021 02:56   CT Maxillofacial Wo Contrast  Result Date: 08/29/2021 CLINICAL DATA:  Altered mental status, Unwitnessed fall, left rib pain EXAM: CT HEAD WITHOUT CONTRAST CT MAXILLOFACIAL WITHOUT CONTRAST CT CERVICAL SPINE WITHOUT CONTRAST CT CHEST, ABDOMEN AND PELVIS WITH CONTRAST TECHNIQUE: Contiguous axial images were obtained from the base of the skull through the vertex without intravenous contrast. Multidetector CT imaging of the maxillofacial structures was performed. Multiplanar CT image reconstructions were also generated. A small metallic BB was placed on the right temple in order to reliably differentiate right from left. Multidetector CT imaging of the cervical spine was performed without intravenous contrast. Multiplanar CT image reconstructions were also generated. Multidetector CT imaging of the chest, abdomen and pelvis was performed following the standard protocol during bolus administration of intravenous contrast. CONTRAST:  169mL OMNIPAQUE IOHEXOL 300 MG/ML  SOLN COMPARISON:  08/25/2021, 07/29/2020 FINDINGS: CT HEAD FINDINGS Brain: Normal anatomic configuration. Parenchymal volume loss is commensurate with the patient's age. Mild periventricular white matter changes are present likely reflecting the sequela of small vessel ischemia. No abnormal intra or extra-axial mass lesion or fluid collection. No abnormal mass effect or midline shift. No evidence of acute intracranial hemorrhage or infarct. Ventricular size is normal. Cerebellum unremarkable. Vascular: No asymmetric hyperdense vasculature at the skull base. Skull: Intact Other: Mastoid air cells and middle ear cavities are clear. Small left frontal scalp hematoma noted. CT MAXILLOFACIAL FINDINGS Osseous: No fracture or mandibular dislocation. No destructive process. Orbits: Ocular lenses have been removed. The orbits are otherwise unremarkable. Sinuses: The paranasal sinuses are clear. Soft tissues: Mild  left preseptal and infraorbital soft tissue swelling. CT CERVICAL SPINE FINDINGS Alignment: 2 mm anterolisthesis of C3 upon C4 is unchanged. Skull base and vertebrae: Craniocervical alignment is normal. Atlantodental interval is not widened. No acute fracture the cervical spine. Vertebral body height has been preserved. Soft tissues and spinal canal: No prevertebral fluid or swelling. No visible canal hematoma. Disc levels: Intervertebral disc space narrowing and endplate remodeling at J6-2 and C6-7 is in keeping with changes of moderate degenerative disc disease. Minimal degenerative changes are seen throughout the remainder of the cervical spine. The prevertebral soft tissues are not thickened on sagittal reformats. Mild central canal stenosis at C3-4 secondary to anterolisthesis and associated posterior disc osteophyte complex. Review of the axial  images demonstrates multilevel uncovertebral and facet arthrosis resulting in moderate left and mild right neuroforaminal narrowing at C3-4 and C4-5. Other: None CT CHEST FINDINGS Cardiovascular: Moderate coronary artery calcification. Global cardiac size within normal limits. No pericardial effusion. Central pulmonary arteries are of normal caliber. The thoracic aorta is unremarkable. Left subclavian dual lead pacemaker is in place with leads within the right atrium and right ventricle. Left internal jugular chest port tip is seen within the superior vena cava. Mediastinum/Nodes: No enlarged mediastinal, hilar, or axillary lymph nodes. Thyroid gland, trachea, and esophagus demonstrate no significant findings. Lungs/Pleura: Mild basilar predominant pulmonary fibrotic change. No focal pulmonary infiltrate. No pneumothorax or pleural effusion. No central obstructing mass. Musculoskeletal: An acute minimally displaced fracture of the left seventh rib is seen laterally. Multiple healed bilateral rib fractures are noted. CT ABDOMEN AND PELVIS FINDINGS Hepatobiliary: No focal  liver abnormality is seen. Status post cholecystectomy. No biliary dilatation. Pancreas: Unremarkable Spleen: Unremarkable Adrenals/Urinary Tract: Adrenal glands are unremarkable. Status post right nephrectomy. Left kidney is normal in size and position. Stable probable hyperdense renal cyst arising from the lower pole of the left kidney. Left kidney is otherwise unremarkable. Bladder unremarkable. Stomach/Bowel: The stomach, small bowel, and large bowel are unremarkable. No free intraperitoneal gas or fluid. Vascular/Lymphatic: Aortic atherosclerosis. No enlarged abdominal or pelvic lymph nodes. Reproductive: Mild prostatic enlargement. Other: Tiny left fat containing inguinal hernia Musculoskeletal: No acute bone abnormality within the abdomen and pelvis. Osseous structures are age-appropriate. IMPRESSION: No acute intracranial injury. No calvarial fracture. Small left frontal scalp hematoma. No acute facial fracture. Mild left preseptal and infraorbital soft tissue swelling. No acute fracture or listhesis of the cervical spine. Acute left seventh rib fracture.  No pneumothorax. No acute intra-abdominal pathology. Additional incidental findings as noted above. Aortic Atherosclerosis (ICD10-I70.0).  Fall Electronically Signed   By: Fidela Salisbury M.D.   On: 08/29/2021 02:56        Scheduled Meds:  cholecalciferol  2,000 Units Oral Daily   fentaNYL (SUBLIMAZE) injection  50 mcg Intravenous Once   ferrous sulfate  325 mg Oral Q breakfast   insulin aspart  0-5 Units Subcutaneous QHS   insulin aspart  0-9 Units Subcutaneous TID WC   insulin glargine-yfgn  15 Units Subcutaneous Daily   levothyroxine  25 mcg Oral QAC breakfast   ondansetron (ZOFRAN) IV  4 mg Intravenous Once   pravastatin  40 mg Oral Daily   vitamin B-12  1,000 mcg Oral Daily   Continuous Infusions:   LOS: 1 day    Time spent: 35 minutes    Sidney Ace, MD [facility]   If 7PM-7AM, please contact  night-coverage  08/30/2021, 9:22 AM

## 2021-08-30 NOTE — TOC Initial Note (Signed)
Transition of Care [facility]) - Initial/Assessment Note    Patient Details  Name: Gary [name] MRN: [id number] Date of Birth: 15-Feb-1946  Transition of Care [facility]) CM/SW Contact:    Kerin Salen, RN Phone Number: 08/30/2021, 10:54 AM  Clinical Narrative:  TOCRN spoke with patient who says he lives alone with two dogs, wife died in September 10, 2020. Able to keep medical appointments, Dr. Nicki Reaper at the [facility] clinic uses [employer] and [employer] for shopping. Get meals on wheels for lunch daily, other meals use microwave or order online.Shopping done by sister.Patient. Uses cane for ambulation however do have a walker if needed when outside of home. Patient denies HHS, and declines SNF. TOC to continue to follow.              Expected Discharge Plan: Skilled Nursing Facility Barriers to Discharge: Continued Medical Work up   Patient Goals and CMS Choice Patient states their goals for this hospitalization and ongoing recovery are:: To return home do not want SNF      Expected Discharge Plan and Services Expected Discharge Plan: [place]       Living arrangements for the past 2 months: Single Family Home                                      Prior Living Arrangements/Services Living arrangements for the past 2 months: Single Family Home Lives with:: Self Patient language and need for interpreter reviewed:: Yes        Need for Family Participation in Patient Care: Yes (Comment) Care giver support system in place?: Yes (comment) (Brother and Sister lives nearby. Neighbor helps when needed.) Current home services: Meals on wheels Criminal Activity/Legal Involvement Pertinent to Current Situation/Hospitalization: No - Comment as needed  Activities of Daily Living Home Assistive Devices/Equipment: Cane (specify quad or straight), Walker (specify type), Eyeglasses, Dentures (specify type) ADL Screening (condition at time of admission) Patient's cognitive ability  adequate to safely complete daily activities?: Yes Is the patient deaf or have difficulty hearing?: No Does the patient have difficulty seeing, even when wearing glasses/contacts?: Yes (due to eye trauma from falls) Does the patient have difficulty concentrating, remembering, or making decisions?: Yes Patient able to express need for assistance with ADLs?: Yes Does the patient have difficulty dressing or bathing?: Yes Independently performs ADLs?: No Does the patient have difficulty walking or climbing stairs?: Yes Weakness of Legs: Both Weakness of Arms/Hands: Both  Permission Sought/Granted                  Emotional Assessment Appearance:: Other (Comment Required (Bruises on forehead and bilateral arms due to fall.) Attitude/Demeanor/Rapport: Guarded Affect (typically observed): Guarded Orientation: : Oriented to Self, Oriented to Place, Oriented to  Time, Oriented to Situation Alcohol / Substance Use: Not Applicable Psych Involvement: No (comment)  Admission diagnosis:  Hypoglycemia [E16.2] Fall [W19.XXXA] FTT (failure to thrive) in adult [R62.7] Generalized weakness [R53.1] Recurrent falls [R29.6] Closed fracture of one rib of left side, initial encounter [S22.32XA] Patient Active Problem List   Diagnosis Date Noted   Recurrent falls 08/29/2021   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    Left rib fracture_7th rib    Leukocytosis    HTN (hypertension)    Hypothyroid    CAD (coronary artery disease)    Porokeratosis 05/15/2021   Aortic atherosclerosis ([place]) 05/05/2021   Emphysema lung (  [place]) 05/05/2021   Thrombocytopenia ([place]) 04/29/2021   Splinter of foot without infection 02/23/2021   Arm laceration 02/09/2021   Hypokalemia 10/24/2020   Injury by nail 08/15/2020   Shingles 07/24/2020   Rash 07/02/2020   Colon cancer screening 07/02/2020   Sick sinus syndrome ([place]) 12/28/2019   Acquired absence of kidney 09/13/2019   Edema of lower extremity  09/13/2019   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, or unspecified 09/13/2019   Proteinuria 09/13/2019   Lower extremity pain, bilateral    Diabetic foot ulcer associated with type 1 diabetes mellitus ([place])    Acute on chronic systolic CHF (congestive heart failure) ([place])    Cellulitis 08/18/2019   Lower limb ulcer, calf ([place]) 08/01/2019   Left hip pain 07/16/2019   Pain due to onychomycosis of toenails of both feet 04/03/2019   Swelling of limb 02/07/2019   Pancytopenia ([place]) 12/03/2018   CKD (chronic kidney disease) stage 3, GFR 30-59 ml/min (HCC) 09/16/2018   Left shoulder pain 09/16/2018   Chronic cholecystitis 12/29/2017   Graves disease 12/29/2017   Peripheral neuropathy 12/29/2017   S/p nephrectomy 12/29/2017   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   Symptomatic bradycardia 12/02/2017   CKD (chronic kidney disease), stage IIIa 30/06/2329   Chronic systolic CHF (congestive heart failure) ([place]) 12/02/2017   Saturday night paralysis 11/12/2017   Hypoglycemia 10/28/2017   Goals of care, counseling/discussion 10/24/2017   Lymphedema 10/20/2017   Iron deficiency anemia 09/11/2017   Fall 08/13/2017   Humerus fracture 08/13/2017   Anemia 01/12/2017   Bilateral carotid artery stenosis 06/29/2016   Hand laceration 12/16/2015   Adjustment disorder with depressed mood 08/11/2015   Cardiomyopathy, idiopathic ([place]) 04/22/2015   CAD in native artery 11/02/2014   Mantle cell lymphoma ([place]) 08/03/2014   History of colonic polyps 04/29/2014   Irregular heart beat 04/22/2014   SOB (shortness of breath) 04/22/2014   Stress 03/21/2014   PVC (premature ventricular contraction) 02/21/2014   GERD (gastroesophageal reflux disease) 10/23/2013   Gastroparesis 06/25/2013   Chest pain 04/13/2013   Thyroid disease 04/13/2013   Diabetes ([place]) 04/13/2013   Essential hypertension, benign 04/13/2013   Hypercholesterolemia 04/13/2013   PCP:   [name], MD Pharmacy:   [id number] [address], [place] [address] Phone: [phone] Fax: [phone]     Social Determinants of Health (SDOH) Interventions    Readmission Risk Interventions Readmission Risk Prevention Plan 08/30/2021  Transportation Screening Complete  PCP or Specialist Appt within 3-5 Days Complete  HRI or [place] Complete  Social Work Consult for [place] Planning/Counseling Complete  Palliative Care Screening Not Applicable  Medication Review Press photographer) Complete  Some recent data might be hidden

## 2021-08-30 NOTE — Evaluation (Signed)
Physical Therapy Evaluation Patient Details Name: Gary [name] MRN: [id number] DOB: May 28, 1946 Today's Date: 08/30/2021  History of Present Illness  Pt admitted for hypoglycemia. Pt with complaints of fall with L 7th rib fracture. History includes DM, HTN, HLD, COPD, GERD, Depression, lymphoma, CHF, PPM.  Clinical Impression  Pt is a pleasant 75 year old male who was admitted for hypoglycemia. Pt performs bed mobility with min assist, transfers with mod A, and ambulation with min assist and RW. Fatigues very quickly with slight exertion, vitals stable. Pt demonstrates deficits with strength/transfers/ambulation/pain. Would be able to dc home if more assistance available; multiple falls. Pt concerned with SNF recommendation due to financial constraints. Would benefit from skilled PT to address above deficits and promote optimal return to PLOF; recommend transition to STR upon discharge from acute hospitalization.      Recommendations for follow up therapy are one component of a multi-disciplinary discharge planning process, led by the attending physician.  Recommendations may be updated based on patient status, additional functional criteria and insurance authorization.  Follow Up Recommendations Skilled nursing-short term rehab (<3 hours/day)    Assistance Recommended at Discharge Intermittent Supervision/Assistance  Functional Status Assessment Patient has had a recent decline in their functional status and demonstrates the ability to make significant improvements in function in a reasonable and predictable amount of time.  Equipment Recommendations  None recommended by PT    Recommendations for Other Services       Precautions / Restrictions Precautions Precautions: Fall Restrictions Weight Bearing Restrictions: No      Mobility  Bed Mobility Overal bed mobility: Needs Assistance Bed Mobility: Supine to Sit     Supine to sit: Min assist     General bed mobility comments:  takes increased time due to SOB with slight exertion. Follows commands well. Pain with transfer to EOB in L rib cage    Transfers Overall transfer level: Needs assistance Equipment used: Rolling walker (2 wheels) Transfers: Bed to chair/wheelchair/BSC     Step pivot transfers: Mod assist       General transfer comment: cues for hand placement. Forward flexed posture    Ambulation/Gait Ambulation/Gait assistance: Min assist Gait Distance (Feet): 3 Feet Assistive device: Rolling walker (2 wheels) Gait Pattern/deviations: Step-to pattern       General Gait Details: distance limited due to SOB. Very slow  Stairs            Wheelchair Mobility    Modified Rankin (Stroke Patients Only)       Balance Overall balance assessment: History of Falls;Needs assistance Sitting-balance support: Feet supported Sitting balance-Leahy Scale: Good     Standing balance support: Bilateral upper extremity supported Standing balance-Leahy Scale: Fair                               Pertinent Vitals/Pain Pain Assessment: 0-10 Pain Score: 4  Pain Location: L ribs Pain Descriptors / Indicators: Discomfort Pain Intervention(s): Limited activity within patient's tolerance;Repositioned    Home Living Family/patient expects to be discharged to:: Private residence Living Arrangements: Alone Available Help at Discharge: Friend(s);Neighbor;Available PRN/intermittently Type of Home: House Home Access: Ramped entrance       Home Layout: One level Home Equipment: Conservation officer, nature (2 wheels);Cane - single point      Prior Function Prior Level of Function : Independent/Modified Independent;History of Falls (last six months)             Mobility Comments:  reports he uses SPC on PRN basis, hasn't needed RW. Very slow ambulation at baseline with multiple falls recently ADLs Comments: indep- likes to take dog for walk     Hand Dominance        Extremity/Trunk  Assessment   Upper Extremity Assessment Upper Extremity Assessment: Generalized weakness    Lower Extremity Assessment Lower Extremity Assessment: Generalized weakness (B LE grossly 3/5)       Communication   Communication: No difficulties  Cognition Arousal/Alertness: Awake/alert Behavior During Therapy: WFL for tasks assessed/performed Overall Cognitive Status: Within Functional Limits for tasks assessed                                          General Comments General comments (skin integrity, edema, etc.): edema with B LE wrapped, stiches and bruising to L hand.    Exercises Other Exercises Other Exercises: seated ther-ex including alt LAQ and marching. 10 reps with cues for breathing due to quick fatigue. O2 at RA at 100% with exertion   Assessment/Plan    PT Assessment Patient needs continued PT services  PT Problem List Decreased strength;Decreased balance;Decreased mobility;Decreased activity tolerance;Cardiopulmonary status limiting activity;Pain;Impaired sensation       PT Treatment Interventions Gait training;DME instruction;Therapeutic exercise;Balance training    PT Goals (Current goals can be found in the Care Plan section)  Acute Rehab PT Goals Patient Stated Goal: to go home PT Goal Formulation: With patient Time For Goal Achievement: 09/13/21 Potential to Achieve Goals: Good    Frequency Min 2X/week   Barriers to discharge Decreased caregiver support      Co-evaluation               AM-PAC PT "6 Clicks" Mobility  Outcome Measure Help needed turning from your back to your side while in a flat bed without using bedrails?: A Little Help needed moving from lying on your back to sitting on the side of a flat bed without using bedrails?: A Little Help needed moving to and from a bed to a chair (including a wheelchair)?: A Little Help needed standing up from a chair using your arms (e.g., wheelchair or bedside chair)?: A  Little Help needed to walk in hospital room?: A Lot Help needed climbing 3-5 steps with a railing? : Total 6 Click Score: 15    End of Session   Activity Tolerance: Patient tolerated treatment well Patient left: in chair;with chair alarm set Nurse Communication: Mobility status PT Visit Diagnosis: Muscle weakness (generalized) (M62.81);History of falling (Z91.81);Difficulty in walking, not elsewhere classified (R26.2);Pain Pain - Right/Left: Left Pain - part of body:  (ribs)    Time: 2992-4268 PT Time Calculation (min) (ACUTE ONLY): 23 min   Charges:   PT Evaluation $PT Eval Low Complexity: 1 Low PT Treatments $Therapeutic Exercise: 8-22 mins        Greggory Stallion, PT, DPT [phone]   Lenton Gendreau 08/30/2021, 10:36 AM

## 2021-08-31 ENCOUNTER — Encounter (INDEPENDENT_AMBULATORY_CARE_PROVIDER_SITE_OTHER): Payer: Self-pay | Admitting: Nurse Practitioner

## 2021-08-31 DIAGNOSIS — E162 Hypoglycemia, unspecified: Secondary | ICD-10-CM | POA: Diagnosis not present

## 2021-08-31 LAB — GLUCOSE, CAPILLARY
Glucose-Capillary: 154 mg/dL — ABNORMAL HIGH (ref 70–99)
Glucose-Capillary: 126 mg/dL — ABNORMAL HIGH (ref 70–99)
Glucose-Capillary: 140 mg/dL — ABNORMAL HIGH (ref 70–99)
Glucose-Capillary: 213 mg/dL — ABNORMAL HIGH (ref 70–99)

## 2021-08-31 MED ORDER — BACITRACIN ZINC 500 UNIT/GM EX OINT
TOPICAL_OINTMENT | Freq: Two times a day (BID) | CUTANEOUS | Status: DC
  Administered 2021-09-07: 1 via TOPICAL
  Filled 2021-08-31 (×13): qty 0.9

## 2021-08-31 MED ORDER — CARVEDILOL 3.125 MG PO TABS
3.1250 mg | ORAL_TABLET | Freq: Two times a day (BID) | ORAL | Status: DC
  Administered 2021-08-31 – 2021-09-05 (×6): 3.125 mg via ORAL
  Filled 2021-08-31 (×9): qty 1

## 2021-08-31 MED ORDER — METHOCARBAMOL 500 MG PO TABS
500.0000 mg | ORAL_TABLET | Freq: Three times a day (TID) | ORAL | Status: DC
  Administered 2021-08-31 – 2021-09-01 (×6): 500 mg via ORAL
  Filled 2021-08-31 (×7): qty 1

## 2021-08-31 MED ORDER — LIDOCAINE 5 % EX PTCH
1.0000 | MEDICATED_PATCH | CUTANEOUS | Status: DC
  Administered 2021-08-31 – 2021-09-13 (×14): 1 via TRANSDERMAL
  Filled 2021-08-31 (×15): qty 1

## 2021-08-31 NOTE — Progress Notes (Signed)
Gary [name] is a very pleasant man, alert and oriented to x4.  He has sustained multiple falls in the past month resulting in a left rub fracture.  A Lidocaine patch was applied to anterior ribs with minimal relief.  He spend a large portion of the day seated in chair.

## 2021-08-31 NOTE — Progress Notes (Signed)
PROGRESS NOTE    Gary [name]  GGY:[id number] DOB: 04-29-1946 DOA: 08/29/2021 PCP: [name], MD    Brief Narrative:  75 y.o. male with medical history significant of type 1 diabetes on insulin pump, hypertension, hyperlipidemia, COPD, GERD, hypothyroidism, depression, peripheral neuropathy, gastroparesis, mantle cell lymphoma, hypothyroidism, Graves' disease, CHF, s/p of nephrectomy, CAD, CKD-3A, SSS, s/p of pacemaker placement, iron deficiency anemia, lymphedema, who presents with fall.   Pt is a poor historian.  History is limited.  Per report, patient recently lost his wife.  He lives alone at home.  Patient fell yesterday, and injured his left chest wall.  Complains of left rib cage pain.  He states that he cannot take deep breaths because of pain.  Initially patient was drowsy, seems to be altered, but when I saw patient in the ED, he is alert, oriented x3.  He moves all extremities normally.  No facial droop or slurred speech.  He states that he has mild shortness of breath and left side of chest wall pain.  No cough, fever or chills.  Denies nausea vomiting, diarrhea or abdominal pain.  No symptoms of UTI.  Patient has lymphedema and venous ulcer in legs. Both legs are wrapped up.  Of note, patient had fall and was seen in ED on 11/14. He had skin laceration in left hand which was sutured up. He had unremarkable CTs of his head, face and cervical spine that time.   Patient was found to have hypoglycemia with blood sugar 19, insulin pump with discontinued.  Seen in consultation by diabetes coordinator.  Recommendations appreciated.  At this time insulin pump has been discontinued.  Patient on basal bolus subcutaneous insulin regimen.  Mental status is improving.  Still with significant pain, mainly in rib   Assessment & Plan:   Principal Problem:   Hypoglycemia Active Problems:   Chest pain   Hypercholesterolemia   Fall   Iron deficiency anemia   CKD (chronic kidney disease),  stage IIIa   Chronic systolic CHF (congestive heart failure) (HCC)   Hypokalemia   Recurrent falls   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   Left rib fracture_7th rib   Leukocytosis   HTN (hypertension)   Hypothyroid   CAD (coronary artery disease)  Severe symptomatic hypoglycemia blood sugar was 19 on admission Insulin pump was discontinued Unclear nature, question pulmonary function Patient was treated with D50 and D10 gtt Diabetes coordinator consulted Plan: Continue current basal bolus regimen Continue holding insulin pump Diabetes coordinator follow-up Accu-Cheks before meals and at bedtime As needed D50 for persistent hypoglycemia   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  recent O3J 7.0 -Sliding scale insulin -Glargine insulin 15 unit daily   Chest pain  Chest pain has resolved.  Troponins unremarkable -Continue pravastatin -As needed EKG chest pain   Hypercholesterolemia -Pravastatin   Fall-recurrent -PT/OT, evaluation pending -Fall precaution   Iron deficiency anemia  Hemoglobin stable, 13.7 -Continue iron supplement   CKD (chronic kidney disease), stage IIIa  Close to baseline.  Baseline creatinine 1.2-1.4 Avoid nephrotoxins -Follow-up with BMP   Chronic systolic CHF (congestive heart failure) (HCC)  BNP 198, no respiratory distress.   No JVD, does not seem to have CHF exacerbation -Continue to hold Lasix.  Restart as appropriate -Coreg restarted 11/20   Hypokalemia  Potassium 3.1, repleted -Monitor and replace as necessary   Left rib fracture_7th rib -Pain control: As needed Percocet, Tylenol, morphine -Incentive spirometry   Leukocytosis No source of infection  identified.   No fever.  Urinalysis negative.  Likely reactive    HTN (hypertension)  Blood pressure is soft -IV hydralazine as needed -Restart Coreg 11/20 -Continue holding Lasix   Hypothyroid -Continue Synthroid   CAD (coronary artery  disease): -Continue pravastatin   Skin laceration in left hand: -Wound care consult, recommendations appreciated   DVT prophylaxis: SCD Code Status: Full Family Communication:Sister Manuela Schwartz [phone] on 11/20 Disposition Plan: Status is: Inpatient  Remains inpatient appropriate because: Symptomatic severe hypoglycemic episode and resultant fall       Level of care: Med-Surg  Consultants:  Diabetes coordinator  Procedures:  None  Antimicrobials: None    Subjective: Seen and examined.  Still sleepy and in pain, but alert and oriented x 3  Objective: Vitals:   08/30/21 1553 08/30/21 2100 08/31/21 0449 08/31/21 0900  BP: (!) 103/49 134/66 127/77 (!) 99/56  Pulse: 75 83 77 87  Resp: 17 18 16 18   Temp: 98.3 F (36.8 C) 98 F (36.7 C) 98.6 F (37 C) 98.6 F (37 C)  TempSrc:  Oral    SpO2: 93% 100% 100% 94%  Weight:      Height:        Intake/Output Summary (Last 24 hours) at 08/31/2021 5361 Last data filed at 08/31/2021 0453 Gross per 24 hour  Intake 480 ml  Output 750 ml  Net -270 ml   Filed Weights   08/30/21 0413  Weight: 102.1 kg    Examination:  General exam: Appears fatigued Respiratory system: Lungs clear, normal WOB, Room air Cardiovascular system: S1S2, RRR, no murmurs, non pitting BL LE edema Gastrointestinal system: Soft, NT/ND, normal bowel sounds Central nervous system: Alert and oriented. No focal neurological deficits. Extremities: Diffusely decreased power BLE Skin: Bilateral periorbital ecchymoses Psychiatry: Judgement and insight appear normal. Mood & affect appropriate.     Data Reviewed: I have personally reviewed following labs and imaging studies  CBC: Recent Labs  Lab 08/29/21 0020 08/30/21 0514  WBC 12.2* 6.4  HGB 13.7 11.7*  HCT 41.7 35.0*  MCV 93.3 93.8  PLT 170 443*   Basic Metabolic Panel: Recent Labs  Lab 08/29/21 0020 08/30/21 0514  NA 145 138  K 3.1* 3.8  CL 107 108  CO2 25 20*  GLUCOSE 43* 178*   BUN 28* 38*  CREATININE 1.46* 1.59*  CALCIUM 8.5* 7.7*  MG 2.0  --    GFR: Estimated Creatinine Clearance: 50.3 mL/min (A) (by C-G formula based on SCr of 1.59 mg/dL (H)). Liver Function Tests: Recent Labs  Lab 08/29/21 0020  AST 17  ALT 7  ALKPHOS 108  BILITOT 0.5  PROT 7.2  ALBUMIN 3.7   No results for input(s): LIPASE, AMYLASE in the last 168 hours. Recent Labs  Lab 08/29/21 0020  AMMONIA 19   Coagulation Profile: Recent Labs  Lab 08/29/21 0020  INR 1.2   Cardiac Enzymes: No results for input(s): CKTOTAL, CKMB, CKMBINDEX, TROPONINI in the last 168 hours. BNP (last 3 results) No results for input(s): PROBNP in the last 8760 hours. HbA1C: No results for input(s): HGBA1C in the last 72 hours. CBG: Recent Labs  Lab 08/30/21 0815 08/30/21 1158 08/30/21 1555 08/30/21 2100 08/31/21 0903  GLUCAP 158* 255* 222* 184* 154*   Lipid Profile: No results for input(s): CHOL, HDL, LDLCALC, TRIG, CHOLHDL, LDLDIRECT in the last 72 hours. Thyroid Function Tests: Recent Labs    08/29/21 0020  TSH 2.066   Anemia Panel: No results for input(s): VITAMINB12, FOLATE, FERRITIN, TIBC, IRON,  RETICCTPCT in the last 72 hours. Sepsis Labs: No results for input(s): PROCALCITON, LATICACIDVEN in the last 168 hours.  Recent Results (from the past 240 hour(s))  Resp Panel by RT-PCR (Flu A&B, Covid) Nasopharyngeal Swab     Status: None   Collection Time: 08/29/21 11:04 AM   Specimen: Nasopharyngeal Swab; Nasopharyngeal(NP) swabs in vial transport medium  Result Value Ref Range Status   SARS Coronavirus 2 by RT PCR NEGATIVE NEGATIVE Final    Comment: (NOTE) SARS-CoV-2 target nucleic acids are NOT DETECTED.  The SARS-CoV-2 RNA is generally detectable in upper respiratory specimens during the acute phase of infection. The lowest concentration of SARS-CoV-2 viral copies this assay can detect is 138 copies/mL. A negative result does not preclude SARS-Cov-2 infection and should not  be used as the sole basis for treatment or other patient management decisions. A negative result may occur with  improper specimen collection/handling, submission of specimen other than nasopharyngeal swab, presence of viral mutation(s) within the areas targeted by this assay, and inadequate number of viral copies(<138 copies/mL). A negative result must be combined with clinical observations, patient history, and epidemiological information. The expected result is Negative.  Fact Sheet for Patients:  EntrepreneurPulse.com.au  Fact Sheet for Healthcare Providers:  IncredibleEmployment.be  This test is no t yet approved or cleared by the [place] FDA and  has been authorized for detection and/or diagnosis of SARS-CoV-2 by FDA under an Emergency Use Authorization (EUA). This EUA will remain  in effect (meaning this test can be used) for the duration of the COVID-19 declaration under Section 564(b)(1) of the Act, 21 U.S.C.section 360bbb-3(b)(1), unless the authorization is terminated  or revoked sooner.       Influenza A by PCR NEGATIVE NEGATIVE Final   Influenza B by PCR NEGATIVE NEGATIVE Final    Comment: (NOTE) The Xpert Xpress SARS-CoV-2/FLU/RSV plus assay is intended as an aid in the diagnosis of influenza from Nasopharyngeal swab specimens and should not be used as a sole basis for treatment. Nasal washings and aspirates are unacceptable for Xpert Xpress SARS-CoV-2/FLU/RSV testing.  Fact Sheet for Patients: EntrepreneurPulse.com.au  Fact Sheet for Healthcare Providers: IncredibleEmployment.be  This test is not yet approved or cleared by the [place] FDA and has been authorized for detection and/or diagnosis of SARS-CoV-2 by FDA under an Emergency Use Authorization (EUA). This EUA will remain in effect (meaning this test can be used) for the duration of the COVID-19 declaration under Section  564(b)(1) of the Act, 21 U.S.C. section 360bbb-3(b)(1), unless the authorization is terminated or revoked.  Performed at [facility], [address], [place], [place] [address]          Radiology Studies: No results found.      Scheduled Meds:  cholecalciferol  2,000 Units Oral Daily   fentaNYL (SUBLIMAZE) injection  50 mcg Intravenous Once   ferrous sulfate  325 mg Oral Q breakfast   insulin aspart  0-5 Units Subcutaneous QHS   insulin aspart  0-9 Units Subcutaneous TID WC   insulin glargine-yfgn  15 Units Subcutaneous Daily   levothyroxine  25 mcg Oral QAC breakfast   lidocaine  1 patch Transdermal Q24H   methocarbamol  500 mg Oral TID   ondansetron (ZOFRAN) IV  4 mg Intravenous Once   pravastatin  40 mg Oral Daily   vitamin B-12  1,000 mcg Oral Daily   Continuous Infusions:   LOS: 2 days    Time spent: 25 minutes    Amandy Chubbuck B  Priscella Mann, MD [facility]   If 7PM-7AM, please contact night-coverage  08/31/2021, 9:27 AM

## 2021-08-31 NOTE — Consult Note (Addendum)
WOC Nurse Consult Note: Reason for Consult:Left hand, two areas of laceration closely approximated with sutures. Wound type:trauma Pressure Injury POA: N/A Drainage (amount, consistency, odor) none Periwound:ecchymosis Dressing procedure/placement/frequency: I have provided Nursing with guidence for the care of these laceration using a twice daily application of bacitracin ointment. The areas are to be left open to air. Suture removal to be in 10-14 days per the instructions of the Provider performing suturing or the patient's PCP.  I will evaluate LEs tomorrow morning.  Nursing is asked to remove wraps and wash and dry LEs today.   Thanks, Maudie Flakes, MSN, RN, [place], Arther Abbott  Pager# [phone]

## 2021-08-31 NOTE — TOC Progression Note (Signed)
Transition of Care [facility]) - Progression Note    Patient Details  Name: Gary [name] MRN: [id number] Date of Birth: 11/09/1945  Transition of Care [facility]) CM/SW [place], [place] Phone Number: 08/31/2021, 1:04 PM  Clinical Narrative:  CSW faxed off SNF referrals in case patient changes his mind upon discharge.     Expected Discharge Plan: [place] Barriers to Discharge: Continued Medical Work up  Expected Discharge Plan and Services Expected Discharge Plan: [place] arrangements for the past 2 months: Single Family Home                                       Social Determinants of Health (SDOH) Interventions    Readmission Risk Interventions Readmission Risk Prevention Plan 08/30/2021  Transportation Screening Complete  PCP or Specialist Appt within 3-5 Days Complete  HRI or [place] Complete  Social Work Consult for [place] Planning/Counseling Complete  Palliative Care Screening Not Applicable  Medication Review Press photographer) Complete  Some recent data might be hidden

## 2021-08-31 NOTE — NC FL2 (Signed)
[place] LEVEL OF CARE SCREENING TOOL     IDENTIFICATION  Patient Name: Gary [name] Birthdate: 1946-02-20 Sex: male Admission Date (Current Location): 08/29/2021  [place] and [place] Number:  Engineering geologist and Address:  [facility], [address], [place], [place] [address]      Provider Number: [id number]  Attending Physician Name and Address:  Sidney Ace, MD  Relative Name and Phone Number:  Gean Maidens, [phone]    Current Level of Care: Hospital Recommended Level of Care: [place] Prior Approval Number:    Date Approved/Denied:   PASRR Number: [id number]  Discharge Plan: SNF    Current Diagnoses: Patient Active Problem List   Diagnosis Date Noted   Recurrent falls 08/29/2021   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    Left rib fracture_7th rib    Leukocytosis    HTN (hypertension)    Hypothyroid    CAD (coronary artery disease)    Porokeratosis 05/15/2021   Aortic atherosclerosis ([place]) 05/05/2021   Emphysema lung ([place]) 05/05/2021   Thrombocytopenia ([place]) 04/29/2021   Splinter of foot without infection 02/23/2021   Arm laceration 02/09/2021   Hypokalemia 10/24/2020   Injury by nail 08/15/2020   Shingles 07/24/2020   Rash 07/02/2020   Colon cancer screening 07/02/2020   Sick sinus syndrome ([place]) 12/28/2019   Acquired absence of kidney 09/13/2019   Edema of lower extremity 09/13/2019   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, or unspecified 09/13/2019   Proteinuria 09/13/2019   Lower extremity pain, bilateral    Diabetic foot ulcer associated with type 1 diabetes mellitus ([place])    Acute on chronic systolic CHF (congestive heart failure) ([place])    Cellulitis 08/18/2019   Lower limb ulcer, calf ([place]) 08/01/2019   Left hip pain 07/16/2019   Pain due to onychomycosis of toenails of both feet 04/03/2019    Swelling of limb 02/07/2019   Pancytopenia ([place]) 12/03/2018   CKD (chronic kidney disease) stage 3, GFR 30-59 ml/min (HCC) 09/16/2018   Left shoulder pain 09/16/2018   Chronic cholecystitis 12/29/2017   Graves disease 12/29/2017   Peripheral neuropathy 12/29/2017   S/p nephrectomy 12/29/2017   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   Symptomatic bradycardia 12/02/2017   CKD (chronic kidney disease), stage IIIa 93/90/3009   Chronic systolic CHF (congestive heart failure) ([place]) 12/02/2017   Saturday night paralysis 11/12/2017   Hypoglycemia 10/28/2017   Goals of care, counseling/discussion 10/24/2017   Lymphedema 10/20/2017   Iron deficiency anemia 09/11/2017   Fall 08/13/2017   Humerus fracture 08/13/2017   Anemia 01/12/2017   Bilateral carotid artery stenosis 06/29/2016   Hand laceration 12/16/2015   Adjustment disorder with depressed mood 08/11/2015   Cardiomyopathy, idiopathic ([place]) 04/22/2015   CAD in native artery 11/02/2014   Mantle cell lymphoma ([place]) 08/03/2014   History of colonic polyps 04/29/2014   Irregular heart beat 04/22/2014   SOB (shortness of breath) 04/22/2014   Stress 03/21/2014   PVC (premature ventricular contraction) 02/21/2014   GERD (gastroesophageal reflux disease) 10/23/2013   Gastroparesis 06/25/2013   Chest pain 04/13/2013   Thyroid disease 04/13/2013   Diabetes ([place]) 04/13/2013   Essential hypertension, benign 04/13/2013   Hypercholesterolemia 04/13/2013    Orientation RESPIRATION BLADDER Height & Weight     Self, Time, Situation, Place  Normal Continent Weight: 225 lb (102.1 kg) Height:  5\' 11"  (180.3 cm)  BEHAVIORAL SYMPTOMS/MOOD NEUROLOGICAL BOWEL NUTRITION STATUS  Continent    AMBULATORY STATUS COMMUNICATION OF NEEDS Skin   Extensive Assist Verbally Normal                       Personal Care Assistance Level of Assistance  Bathing, Feeding, Dressing Bathing Assistance: Limited assistance Feeding assistance:  Independent Dressing Assistance: Independent     Functional Limitations Info  Hearing, Sight, Speech Sight Info: Adequate Hearing Info: Adequate Speech Info: Adequate    SPECIAL CARE FACTORS FREQUENCY                       Contractures Contractures Info: Not present    Additional Factors Info  Code Status, Allergies Code Status Info: Full Allergies Info: Rofecoxib           Current Medications (08/31/2021):  This is the current hospital active medication list Current Facility-Administered Medications  Medication Dose Route Frequency Provider Last Rate Last Admin   acetaminophen (TYLENOL) tablet 650 mg  650 mg Oral Q6H PRN Ivor Costa, MD       albuterol (PROVENTIL) (2.5 MG/3ML) 0.083% nebulizer solution 2.5 mg  2.5 mg Nebulization Q4H PRN Ivor Costa, MD       carvedilol (COREG) tablet 3.125 mg  3.125 mg Oral BID WC Sreenath, Sudheer B, MD       cholecalciferol (VITAMIN D3) tablet 2,000 Units  2,000 Units Oral Daily Ivor Costa, MD   2,000 Units at 08/31/21 0933   dextromethorphan-guaiFENesin (Cloud DM) 30-600 MG per 12 hr tablet 1 tablet  1 tablet Oral BID PRN Ivor Costa, MD       fentaNYL (SUBLIMAZE) injection 50 mcg  50 mcg Intravenous Once Ward, Kristen N, DO       ferrous sulfate tablet 325 mg  325 mg Oral Q breakfast Ivor Costa, MD   325 mg at 08/31/21 4403   hydrALAZINE (APRESOLINE) injection 5 mg  5 mg Intravenous Q2H PRN Ivor Costa, MD       insulin aspart (novoLOG) injection 0-5 Units  0-5 Units Subcutaneous QHS Ivor Costa, MD       insulin aspart (novoLOG) injection 0-9 Units  0-9 Units Subcutaneous TID WC Ivor Costa, MD   1 Units at 08/31/21 1226   insulin glargine-yfgn (SEMGLEE) injection 15 Units  15 Units Subcutaneous Daily Ivor Costa, MD   15 Units at 08/31/21 0930   levothyroxine (SYNTHROID) tablet 25 mcg  25 mcg Oral QAC breakfast Ivor Costa, MD   25 mcg at 08/31/21 0520   lidocaine (LIDODERM) 5 % 1 patch  1 patch Transdermal Q24H Ralene Muskrat B,  MD   1 patch at 08/31/21 0936   methocarbamol (ROBAXIN) tablet 500 mg  500 mg Oral TID Ralene Muskrat B, MD   500 mg at 08/31/21 0932   morphine 2 MG/ML injection 1 mg  1 mg Intravenous Q3H PRN Ralene Muskrat B, MD       ondansetron (ZOFRAN) injection 4 mg  4 mg Intravenous Once Ward, Kristen N, DO       ondansetron (ZOFRAN) injection 4 mg  4 mg Intravenous Q8H PRN Ivor Costa, MD       oxyCODONE-acetaminophen (PERCOCET/ROXICET) 5-325 MG per tablet 1 tablet  1 tablet Oral Q4H PRN Ivor Costa, MD   1 tablet at 08/31/21 1227   pravastatin (PRAVACHOL) tablet 40 mg  40 mg Oral Daily Ivor Costa, MD   40 mg at 08/30/21 2255   vitamin B-12 (CYANOCOBALAMIN) tablet 1,000 mcg  1,000  mcg Oral Daily Ivor Costa, MD   1,000 mcg at 08/31/21 0935   Facility-Administered Medications Ordered in Other Encounters  Medication Dose Route Frequency Provider Last Rate Last Admin   heparin lock flush 100 unit/mL  500 Units Intravenous Once Lloyd Huger, MD       sodium chloride flush (NS) 0.9 % injection 10 mL  10 mL Intravenous PRN Lloyd Huger, MD   10 mL at 02/01/18 0829   sodium chloride flush (NS) 0.9 % injection 10 mL  10 mL Intravenous PRN Lloyd Huger, MD   10 mL at 04/05/18 0800     Discharge Medications: Please see discharge summary for a list of discharge medications.  Relevant Imaging Results:  Relevant Lab Results:   Additional Information SSN [id number]  insulin pump 3 times daily with meals, which patient maintains himself.  Raina Mina, LCSWA

## 2021-08-31 NOTE — TOC Progression Note (Signed)
Transition of Care [facility]) - Progression Note    Patient Details  Name: Gary [name] MRN: [id number] Date of Birth: June 15, 1946  Transition of Care [facility]) CM/SW [place], [place] Phone Number: 08/31/2021, 12:33 PM  Clinical Narrative: CSW spoke with patients sister, [name] who plans on speaking with her brother further to get him to go to SNF. Patients sister stated patient is scared and feels he will be left at the facility. CSW stated she will send referrals out in case he changes his mind.     Expected Discharge Plan: [place] Barriers to Discharge: Continued Medical Work up  Expected Discharge Plan and Services Expected Discharge Plan: [place] arrangements for the past 2 months: Single Family Home                                       Social Determinants of Health (SDOH) Interventions    Readmission Risk Interventions Readmission Risk Prevention Plan 08/30/2021  Transportation Screening Complete  PCP or Specialist Appt within 3-5 Days Complete  HRI or [place] Complete  Social Work Consult for [place] Planning/Counseling Complete  Palliative Care Screening Not Applicable  Medication Review Press photographer) Complete  Some recent data might be hidden

## 2021-08-31 NOTE — Evaluation (Signed)
Occupational Therapy Evaluation Patient Details Name: Gary [name] MRN: [id number] DOB: 1946/08/04 Today's Date: 08/31/2021   History of Present Illness Pt admitted for hypoglycemia. Pt with complaints of fall with L 7th rib fracture. History includes DM, HTN, HLD, COPD, GERD, Depression, lymphoma, CHF, PPM.   Clinical Impression   Chart reviewed, pt greeted in bed agreeable to OT evaluation. Pt is alert and oriented x4, fair safety awareness noted when discussing home safety and potential discharge plans. Carry over noted with education, however. Pt reports he is independent in ADL PTA, assistance with IADLs. Pt sister assists with cooking/cleaning as needed (pt also participates) and he receives meals on wheels. Pt presents with performance deficits in generalized strength and endurance s/p fall, affecting safe, independent ADL completion. Pt is also limited by L rib pain. BUE presents with generalized weakness, however grip and pinch strength appear WFL for functional package/container management. Education provided re: discharge recommendations. At this time, STR is recommended, however pt could potentially discharge home with assistance pending progress. Pt is left in bedside chair, NAD, all needs met. RN aware of pt status. OT will continue to follow while admitted.      Recommendations for follow up therapy are one component of a multi-disciplinary discharge planning process, led by the attending physician.  Recommendations may be updated based on patient status, additional functional criteria and insurance authorization.   Follow Up Recommendations  Skilled nursing-short term rehab (<3 hours/day)    Assistance Recommended at Discharge Frequent or constant Supervision/Assistance  Functional Status Assessment  Patient has had a recent decline in their functional status and demonstrates the ability to make significant improvements in function in a reasonable and predictable amount of time.   Equipment Recommendations  BSC/3in1;Tub/shower bench    Recommendations for Other Services       Precautions / Restrictions Precautions Precautions: Fall Restrictions Weight Bearing Restrictions: No      Mobility Bed Mobility Overal bed mobility: Needs Assistance Bed Mobility: Supine to Sit     Supine to sit: Min assist;HOB elevated          Transfers Overall transfer level: Needs assistance Equipment used: Rolling walker (2 wheels) Transfers: Sit to/from Stand;Bed to chair/wheelchair/BSC Sit to Stand: Min assist     Step pivot transfers: Min guard     General transfer comment: vcs for upright posture during transfer      Balance Overall balance assessment: History of Falls;Needs assistance Sitting-balance support: Feet supported;No upper extremity supported Sitting balance-Leahy Scale: Good Sitting balance - Comments: during ADL tasks requiring dynamic sitting balance   Standing balance support: Bilateral upper extremity supported Standing balance-Leahy Scale: Fair Standing balance comment: reliance on RW                           ADL either performed or assessed with clinical judgement   ADL Overall ADL's : Needs assistance/impaired Eating/Feeding: Set up   Grooming: Wash/dry hands;Wash/dry face;Minimal assistance;Set up;Oral care;Applying deodorant Grooming Details (indicate cue type and reason): SET UP for washing hands, oral care, deoderant in seated; MIN A for washing face Upper Body Bathing: Minimal assistance;Sitting   Lower Body Bathing: Minimal assistance;Sit to/from stand   Upper Body Dressing : Minimal assistance;Sitting   Lower Body Dressing: Maximal assistance;Bed level Lower Body Dressing Details (indicate cue type and reason): socks; at baseline pt reports he does not wear socks Toilet Transfer: Min guard;Rolling walker (2 wheels) Toilet Transfer Details (indicate cue type and  reason): simulated         Functional  mobility during ADLs: Min guard;Rolling walker (2 wheels) General ADL Comments: pt L sided rib pain throughout, requiring increased time     Vision Baseline Vision/History: 1 Wears glasses Patient Visual Report: No change from baseline       Perception     Praxis      Pertinent Vitals/Pain Pain Score: 6  Pain Location: L ribs Pain Descriptors / Indicators: Discomfort Pain Intervention(s): Limited activity within patient's tolerance;Patient requesting pain meds-RN notified;Monitored during session;Repositioned     Hand Dominance     Extremity/Trunk Assessment Upper Extremity Assessment Upper Extremity Assessment: Generalized weakness   Lower Extremity Assessment Lower Extremity Assessment: Generalized weakness   Cervical / Trunk Assessment Cervical / Trunk Assessment: Kyphotic   Communication Communication Communication: No difficulties   Cognition Arousal/Alertness: Awake/alert Behavior During Therapy: WFL for tasks assessed/performed Overall Cognitive Status: Within Functional Limits for tasks assessed                                 General Comments: pt is alert and oriented x4, follows two step directives with good accuracy     General Comments  BLE edema (LE wrapped), bruising on face and L hand    Exercises Other Exercises Other Exercises: education re: role of OT, role of rehab, home safety, safe ADL completion, use of AE for safe showering/toileting; pt accepts all information   Shoulder Instructions      Home Living Family/patient expects to be discharged to:: Private residence Living Arrangements: Alone Available Help at Discharge: Friend(s);Neighbor;Available PRN/intermittently Type of Home: House Home Access: Ramped entrance     Home Layout: One level     Bathroom Shower/Tub: Teacher, early years/pre: Standard     Home Equipment: Conservation officer, nature (2 wheels);Cane - single point          Prior  Functioning/Environment Prior Level of Function : History of Falls (last six months);Independent/Modified Independent             Mobility Comments: Pt reports use of SPC as needed; ADLs Comments: reports no assistance needed for ADL; assistance for IADL- cooking, cleaning; Pt reports he does drive        OT Problem List: Decreased strength;Decreased activity tolerance;Decreased knowledge of use of DME or AE      OT Treatment/Interventions: Self-care/ADL training;Energy conservation;Balance training;Therapeutic exercise;DME and/or AE instruction;Therapeutic activities;Patient/family education    OT Goals(Current goals can be found in the care plan section) Acute Rehab OT Goals Patient Stated Goal: to eventually go home to his dog OT Goal Formulation: With patient Time For Goal Achievement: 09/14/21 Potential to Achieve Goals: Good ADL Goals Pt Will Perform Grooming: standing;with modified independence Pt Will Perform Upper Body Dressing: with modified independence Pt Will Perform Lower Body Dressing: with modified independence Pt Will Transfer to Toilet: with modified independence;bedside commode Pt Will Perform Toileting - Clothing Manipulation and hygiene: with modified independence;sit to/from stand  OT Frequency: Min 3X/week   Barriers to D/C:            Co-evaluation              AM-PAC OT "6 Clicks" Daily Activity     Outcome Measure Help from another person eating meals?: None Help from another person taking care of personal grooming?: A Little Help from another person toileting, which includes using toliet, bedpan, or urinal?: A  Lot Help from another person bathing (including washing, rinsing, drying)?: A Lot Help from another person to put on and taking off regular upper body clothing?: A Little Help from another person to put on and taking off regular lower body clothing?: A Lot 6 Click Score: 16   End of Session Equipment Utilized During Treatment: Gait  belt;Rolling walker (2 wheels) Nurse Communication: Mobility status  Activity Tolerance: Patient tolerated treatment well Patient left: in chair;with call bell/phone within reach;with chair alarm set  OT Visit Diagnosis: Unsteadiness on feet (R26.81);Other abnormalities of gait and mobility (R26.89);Repeated falls (R29.6)                Time: 5940-9050 OT Time Calculation (min): 24 min Charges:  OT General Charges $OT Visit: 1 Visit OT Evaluation $OT Eval Moderate Complexity: 1 Mod OT Treatments $Self Care/Home Management : 8-22 mins Shanon Payor, OTD OTR/L  08/31/21, 11:21 AM

## 2021-09-01 DIAGNOSIS — E162 Hypoglycemia, unspecified: Secondary | ICD-10-CM | POA: Diagnosis not present

## 2021-09-01 LAB — GLUCOSE, CAPILLARY
Glucose-Capillary: 155 mg/dL — ABNORMAL HIGH (ref 70–99)
Glucose-Capillary: 175 mg/dL — ABNORMAL HIGH (ref 70–99)
Glucose-Capillary: 235 mg/dL — ABNORMAL HIGH (ref 70–99)
Glucose-Capillary: 254 mg/dL — ABNORMAL HIGH (ref 70–99)
Glucose-Capillary: 255 mg/dL — ABNORMAL HIGH (ref 70–99)

## 2021-09-01 MED ORDER — POLYVINYL ALCOHOL 1.4 % OP SOLN
1.0000 [drp] | OPHTHALMIC | Status: DC | PRN
  Administered 2021-09-01: 05:00:00 1 [drp] via OPHTHALMIC
  Filled 2021-09-01: qty 15

## 2021-09-01 MED ORDER — FLUTICASONE PROPIONATE 50 MCG/ACT NA SUSP
2.0000 | Freq: Every day | NASAL | Status: DC
  Administered 2021-09-01 – 2021-09-13 (×13): 2 via NASAL
  Filled 2021-09-01: qty 16

## 2021-09-01 MED ORDER — FUROSEMIDE 40 MG PO TABS
40.0000 mg | ORAL_TABLET | Freq: Every day | ORAL | Status: DC
  Administered 2021-09-01 – 2021-09-10 (×9): 40 mg via ORAL
  Filled 2021-09-01 (×10): qty 1

## 2021-09-01 NOTE — Progress Notes (Signed)
Occupational Therapy Treatment Patient Details Name: Gary [name] MRN: [id number] DOB: 1946/04/23 Today's Date: 09/01/2021   History of present illness Pt. is  75 y.o. male who was admitted with hypoglycemia, resulting in a fall. Pt. sustained a  L 7th rib fracture, and multiple lacerations. PMHx includes: DM, HTN, HLD, COPD, GERD, Depression, lymphoma, CHF, PPM.   OT comments  Upon arrival, pt. had just had his bilateral LE's wrapped. Pt. reports 4/10 left sided rib pain. Pt. reports that his pain increases to 7/10 when he moves around. SO2 99%, HR 68 bpms. Pt. education was provided about A/E use for LE ADLs. Reviewed pt.'s daily routines, work simplification strategies. Pt. reports having meals, on wheels for one meal day, and typically makes breakfast in the microwave. Pt. reports bathing while standing at the sinkside.Pt. reports having a large dog at home. Pt. Continues to benefit from OT services for ADL training, A/E training, and pt. Education about work simplification strategies, home modification, and DME.    Recommendations for follow up therapy are one component of a multi-disciplinary discharge planning process, led by the attending physician.  Recommendations may be updated based on patient status, additional functional criteria and insurance authorization.    Follow Up Recommendations  Skilled nursing-short term rehab (<3 hours/day)    Assistance Recommended at Discharge Frequent or constant Supervision/Assistance  Equipment Recommendations  BSC/3in1;Tub/shower bench    Recommendations for Other Services      Precautions / Restrictions Precautions Precautions: Fall Restrictions Weight Bearing Restrictions: No       Mobility Bed Mobility Overal bed mobility: Needs Assistance Bed Mobility: Supine to Sit;Sit to Supine     Supine to sit: Mod assist;HOB elevated Sit to supine: Mod assist;HOB elevated   General bed mobility comments: Mod Assist repositioning     Transfers   Balance                        ADL either performed or assessed with clinical judgement   ADL                   Upper Body Dressing : Minimal assistance   Lower Body Dressing: Maximal assistance                      Extremity/Trunk Assessment Upper Extremity Assessment Upper Extremity Assessment: Generalized weakness            Vision Baseline Vision/History: 1 Wears glasses Patient Visual Report: No change from baseline     Perception     Praxis      Cognition Arousal/Alertness: Awake/alert Behavior During Therapy: WFL for tasks assessed/performed Overall Cognitive Status: Within Functional Limits for tasks assessed                                 General Comments: patient able to follow all commands without difficulty          Exercises     Shoulder Instructions       General Comments      Pertinent Vitals/ Pain       Pain Assessment: 0-10 Pain Score: 4  Pain Location: Left ribs (Pt. reports rib pain increases to 7 when walking.) Pain Descriptors / Indicators: Aching Pain Intervention(s): Limited activity within patient's tolerance  Home Living  Prior Functioning/Environment              Frequency  Min 3X/week        Progress Toward Goals  OT Goals(current goals can now be found in the care plan section)  Progress towards OT goals: Progressing toward goals  Acute Rehab OT Goals Patient Stated Goal: To return home OT Goal Formulation: With patient Time For Goal Achievement: 09/14/21 Potential to Achieve Goals: Good  Plan      Co-evaluation                 AM-PAC OT "6 Clicks" Daily Activity     Outcome Measure   Help from another person eating meals?: None Help from another person taking care of personal grooming?: A Little Help from another person toileting, which includes using toliet, bedpan, or urinal?:  A Lot Help from another person bathing (including washing, rinsing, drying)?: A Lot Help from another person to put on and taking off regular upper body clothing?: A Little Help from another person to put on and taking off regular lower body clothing?: A Lot 6 Click Score: 16    End of Session    OT Visit Diagnosis: Unsteadiness on feet (R26.81);Other abnormalities of gait and mobility (R26.89);Repeated falls (R29.6)   Activity Tolerance Patient tolerated treatment well   Patient Left in bed;with bed alarm set   Nurse Communication Mobility status        Time: 1340-1403 OT Time Calculation (min): 23 min  Charges: OT General Charges $OT Visit: 1 Visit OT Treatments $Self Care/Home Management : 23-37 mins  Harrel Carina, MS, OTR/L   Harrel Carina 09/01/2021, 2:12 PM

## 2021-09-01 NOTE — Plan of Care (Signed)
Alert and oriented, VSS this shift. Blood glucose monitored. Pt c/o eyes burning and "nasal stuffiness." PRN eyedrops and daily flonase ordered. Pain controlled with scheduled and prn medications. Bed low and in locked position. Bed alarm on.  Problem: Health Behavior/Discharge Planning: Goal: Ability to manage health-related needs will improve 08/31/2021 2248 by Wende Crease, RN Outcome: Progressing 08/31/2021 1917 by Wende Crease, RN Outcome: Progressing   Problem: Education: Goal: Knowledge of General Education information will improve Description: Including pain rating scale, medication(s)/side effects and non-pharmacologic comfort measures 08/31/2021 2248 by Wende Crease, RN Outcome: Progressing 08/31/2021 1917 by Wende Crease, RN Outcome: Progressing   Problem: Clinical Measurements: Goal: Diagnostic test results will improve 08/31/2021 2248 by Wende Crease, RN Outcome: Progressing 08/31/2021 1917 by Wende Crease, RN Outcome: Progressing   Problem: Pain Managment: Goal: General experience of comfort will improve 08/31/2021 2248 by Wende Crease, RN Outcome: Progressing 08/31/2021 1917 by Wende Crease, RN Outcome: Progressing   Problem: Safety: Goal: Ability to remain free from injury will improve 08/31/2021 2248 by Wende Crease, RN Outcome: Progressing 08/31/2021 1917 by Wende Crease, RN Outcome: Progressing

## 2021-09-01 NOTE — Progress Notes (Signed)
Per wound nurse request, patient's unna boots were removed this morning and legs cleaned with soap and water.  Patient has one venous wound on his right lateral lower leg that appeared to have medihoney covering it.  Patient tolerated dressing removal and cleaning without complaints.

## 2021-09-01 NOTE — Progress Notes (Signed)
PROGRESS NOTE    Gary [name]  HTD:[id number] DOB: 06-May-1946 DOA: 08/29/2021 PCP: [name], MD    Brief Narrative:  75 y.o. male with medical history significant of type 1 diabetes on insulin pump, hypertension, hyperlipidemia, COPD, GERD, hypothyroidism, depression, peripheral neuropathy, gastroparesis, mantle cell lymphoma, hypothyroidism, Graves' disease, CHF, s/p of nephrectomy, CAD, CKD-3A, SSS, s/p of pacemaker placement, iron deficiency anemia, lymphedema, who presents with fall.   Pt is a poor historian.  History is limited.  Per report, patient recently lost his wife.  He lives alone at home.  Patient fell yesterday, and injured his left chest wall.  Complains of left rib cage pain.  He states that he cannot take deep breaths because of pain.  Initially patient was drowsy, seems to be altered, but when I saw patient in the ED, he is alert, oriented x3.  He moves all extremities normally.  No facial droop or slurred speech.  He states that he has mild shortness of breath and left side of chest wall pain.  No cough, fever or chills.  Denies nausea vomiting, diarrhea or abdominal pain.  No symptoms of UTI.  Patient has lymphedema and venous ulcer in legs. Both legs are wrapped up.  Of note, patient had fall and was seen in ED on 11/14. He had skin laceration in left hand which was sutured up. He had unremarkable CTs of his head, face and cervical spine that time.   Patient was found to have hypoglycemia with blood sugar 19, insulin pump with discontinued.  Seen in consultation by diabetes coordinator.  Recommendations appreciated.  At this time insulin pump has been discontinued.  Patient on basal bolus subcutaneous insulin regimen.  Mental status is improving.  Still with significant pain, mainly in rib   Assessment & Plan:   Principal Problem:   Hypoglycemia Active Problems:   Chest pain   Hypercholesterolemia   Fall   Iron deficiency anemia   CKD (chronic kidney disease),  stage IIIa   Chronic systolic CHF (congestive heart failure) (HCC)   Hypokalemia   Recurrent falls   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   Left rib fracture_7th rib   Leukocytosis   HTN (hypertension)   Hypothyroid   CAD (coronary artery disease)  Severe symptomatic hypoglycemia blood sugar was 19 on admission Insulin pump was discontinued Unclear nature, question pulmonary function Patient was treated with D50 and D10 gtt Diabetes coordinator consulted Plan: Continue current Semglee and NovoLog regimen Continue holding insulin pump Diabetes coordinator follow-up Accu-Cheks before meals and at bedtime As needed D50 for persistent hypoglycemia Needs follow-up from diabetes coordinator   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  recent B2I 7.0 -Sliding scale insulin -Glargine insulin 15 unit daily   Chest pain  Chest pain has resolved.  Troponins unremarkable -Continue pravastatin -As needed EKG chest pain   Hypercholesterolemia -Pravastatin   Fall-recurrent -PT/OT, recommend SNF -Fall precaution   Iron deficiency anemia  Hemoglobin stable, 13.7 -Continue iron supplement   CKD (chronic kidney disease), stage IIIa  Close to baseline.  Baseline creatinine 1.2-1.4 Avoid nephrotoxins   Chronic systolic CHF (congestive heart failure) (HCC)  BNP 198, no respiratory distress.   Not acutely exacerbated -Restart Lasix 11/21 monitor and replace as necessary -Coreg restarted 11/20   Hypokalemia  Potassium 3.1, repleted -Monitor and replace as necessary   Left rib fracture_7th rib -Pain control: As needed Percocet, Tylenol, morphine -Incentive spirometry   Leukocytosis No source of infection identified.  No fever.  Urinalysis negative.  Likely reactive    HTN (hypertension) -IV hydralazine as needed -Restart Coreg 11/20 -Restart Lasix 11/21   Hypothyroid -Continue Synthroid   CAD (coronary artery disease): -Continue pravastatin    Skin laceration in left hand: -Wound care consult, recommendations appreciated   DVT prophylaxis: SCD Code Status: Full Family Communication:Sister Manuela Schwartz [phone] on 11/20 Disposition Plan: Status is: Inpatient  Remains inpatient appropriate because: Symptomatic severe hypoglycemic episode and resultant fall.  Will benefit from skilled nursing facility placement if patient is agreeable.       Level of care: Med-Surg  Consultants:  Diabetes coordinator  Procedures:  None  Antimicrobials: None    Subjective: Seen and examined.  Still sleepy and in pain, but alert and oriented x 3  Objective: Vitals:   08/31/21 1944 09/01/21 0024 09/01/21 0453 09/01/21 0715  BP: 128/62 (!) 119/58 (!) 114/59 (!) 91/56  Pulse: 78 91 72 70  Resp: 19 18 18 16   Temp: 98.3 F (36.8 C) 98.7 F (37.1 C) 98.8 F (37.1 C) 98.2 F (36.8 C)  TempSrc: Oral Oral  Oral  SpO2: 99% 95% 96% 97%  Weight:      Height:        Intake/Output Summary (Last 24 hours) at 09/01/2021 0959 Last data filed at 09/01/2021 0500 Gross per 24 hour  Intake 1240 ml  Output 1000 ml  Net 240 ml   Filed Weights   08/30/21 0413  Weight: 102.1 kg    Examination:  General exam: Sleepy.  Appears fatigued Respiratory system: Lungs clear, normal WOB, Room air Cardiovascular system: S1S2, RRR, no murmurs, non pitting BL LE edema Gastrointestinal system: Soft, NT/ND, normal bowel sounds Central nervous system: Alert and oriented. No focal neurological deficits. Extremities: Diffusely decreased power BLE Skin: Bilateral periorbital ecchymoses.  Scattered ecchymoses Psychiatry: Judgement and insight appear normal. Mood & affect appropriate.     Data Reviewed: I have personally reviewed following labs and imaging studies  CBC: Recent Labs  Lab 08/29/21 0020 08/30/21 0514  WBC 12.2* 6.4  HGB 13.7 11.7*  HCT 41.7 35.0*  MCV 93.3 93.8  PLT 170 272*   Basic Metabolic Panel: Recent Labs  Lab  08/29/21 0020 08/30/21 0514  NA 145 138  K 3.1* 3.8  CL 107 108  CO2 25 20*  GLUCOSE 43* 178*  BUN 28* 38*  CREATININE 1.46* 1.59*  CALCIUM 8.5* 7.7*  MG 2.0  --    GFR: Estimated Creatinine Clearance: 50.3 mL/min (A) (by C-G formula based on SCr of 1.59 mg/dL (H)). Liver Function Tests: Recent Labs  Lab 08/29/21 0020  AST 17  ALT 7  ALKPHOS 108  BILITOT 0.5  PROT 7.2  ALBUMIN 3.7   No results for input(s): LIPASE, AMYLASE in the last 168 hours. Recent Labs  Lab 08/29/21 0020  AMMONIA 19   Coagulation Profile: Recent Labs  Lab 08/29/21 0020  INR 1.2   Cardiac Enzymes: No results for input(s): CKTOTAL, CKMB, CKMBINDEX, TROPONINI in the last 168 hours. BNP (last 3 results) No results for input(s): PROBNP in the last 8760 hours. HbA1C: No results for input(s): HGBA1C in the last 72 hours. CBG: Recent Labs  Lab 08/31/21 0903 08/31/21 1158 08/31/21 1622 08/31/21 2137 09/01/21 0837  GLUCAP 154* 126* 140* 213* 175*   Lipid Profile: No results for input(s): CHOL, HDL, LDLCALC, TRIG, CHOLHDL, LDLDIRECT in the last 72 hours. Thyroid Function Tests: No results for input(s): TSH, T4TOTAL, FREET4, T3FREE, THYROIDAB in the last 72  hours.  Anemia Panel: No results for input(s): VITAMINB12, FOLATE, FERRITIN, TIBC, IRON, RETICCTPCT in the last 72 hours. Sepsis Labs: No results for input(s): PROCALCITON, LATICACIDVEN in the last 168 hours.  Recent Results (from the past 240 hour(s))  Resp Panel by RT-PCR (Flu A&B, Covid) Nasopharyngeal Swab     Status: None   Collection Time: 08/29/21 11:04 AM   Specimen: Nasopharyngeal Swab; Nasopharyngeal(NP) swabs in vial transport medium  Result Value Ref Range Status   SARS Coronavirus 2 by RT PCR NEGATIVE NEGATIVE Final    Comment: (NOTE) SARS-CoV-2 target nucleic acids are NOT DETECTED.  The SARS-CoV-2 RNA is generally detectable in upper respiratory specimens during the acute phase of infection. The  lowest concentration of SARS-CoV-2 viral copies this assay can detect is 138 copies/mL. A negative result does not preclude SARS-Cov-2 infection and should not be used as the sole basis for treatment or other patient management decisions. A negative result may occur with  improper specimen collection/handling, submission of specimen other than nasopharyngeal swab, presence of viral mutation(s) within the areas targeted by this assay, and inadequate number of viral copies(<138 copies/mL). A negative result must be combined with clinical observations, patient history, and epidemiological information. The expected result is Negative.  Fact Sheet for Patients:  EntrepreneurPulse.com.au  Fact Sheet for Healthcare Providers:  IncredibleEmployment.be  This test is no t yet approved or cleared by the [place] FDA and  has been authorized for detection and/or diagnosis of SARS-CoV-2 by FDA under an Emergency Use Authorization (EUA). This EUA will remain  in effect (meaning this test can be used) for the duration of the COVID-19 declaration under Section 564(b)(1) of the Act, 21 U.S.C.section 360bbb-3(b)(1), unless the authorization is terminated  or revoked sooner.       Influenza A by PCR NEGATIVE NEGATIVE Final   Influenza B by PCR NEGATIVE NEGATIVE Final    Comment: (NOTE) The Xpert Xpress SARS-CoV-2/FLU/RSV plus assay is intended as an aid in the diagnosis of influenza from Nasopharyngeal swab specimens and should not be used as a sole basis for treatment. Nasal washings and aspirates are unacceptable for Xpert Xpress SARS-CoV-2/FLU/RSV testing.  Fact Sheet for Patients: EntrepreneurPulse.com.au  Fact Sheet for Healthcare Providers: IncredibleEmployment.be  This test is not yet approved or cleared by the [place] FDA and has been authorized for detection and/or diagnosis of SARS-CoV-2 by FDA under  an Emergency Use Authorization (EUA). This EUA will remain in effect (meaning this test can be used) for the duration of the COVID-19 declaration under Section 564(b)(1) of the Act, 21 U.S.C. section 360bbb-3(b)(1), unless the authorization is terminated or revoked.  Performed at [facility], [address], [place], [place] [address]          Radiology Studies: No results found.      Scheduled Meds:  bacitracin   Topical BID   carvedilol  3.125 mg Oral BID WC   cholecalciferol  2,000 Units Oral Daily   fentaNYL (SUBLIMAZE) injection  50 mcg Intravenous Once   ferrous sulfate  325 mg Oral Q breakfast   fluticasone  2 spray Each Nare Daily   insulin aspart  0-5 Units Subcutaneous QHS   insulin aspart  0-9 Units Subcutaneous TID WC   insulin glargine-yfgn  15 Units Subcutaneous Daily   levothyroxine  25 mcg Oral QAC breakfast   lidocaine  1 patch Transdermal Q24H   methocarbamol  500 mg Oral TID   ondansetron (ZOFRAN) IV  4 mg Intravenous Once  pravastatin  40 mg Oral Daily   vitamin B-12  1,000 mcg Oral Daily   Continuous Infusions:   LOS: 3 days    Time spent: 25 minutes    Sidney Ace, MD [facility]   If 7PM-7AM, please contact night-coverage  09/01/2021, 9:59 AM

## 2021-09-01 NOTE — Progress Notes (Signed)
Patient reports decreased chest pain this shift.  However requiring po pain medication following therapy session.  Bilateral Una boots have been changed this shift with reported increase comfort.  Blood glucoses has been covered with novolog through shift.

## 2021-09-01 NOTE — Progress Notes (Signed)
Physical Therapy Treatment Patient Details Name: RIKER COLLIER MRN: [id number] DOB: 1946/04/23 Today's Date: 09/01/2021   History of Present Illness Pt admitted for hypoglycemia. Pt with complaints of fall with L 7th rib fracture. History includes DM, HTN, HLD, COPD, GERD, Depression, lymphoma, CHF, PPM.    PT Comments    Patient is agreeable to PT. He reports he has felt short of breath for years. Mild shortness of breath noted with activity with 5/10 left rib pain reported during mobility, Sp02 98-100% on room air. Patient did increase ambulation distance this session but is fatigued with activity. He continues to require physical assistance for all mobility. He lives at home alone. Recommend SNF placement for ongoing PT to maximize independence and facilitate return to prior level of function. Discussed discharge recommendation with patient.     Recommendations for follow up therapy are one component of a multi-disciplinary discharge planning process, led by the attending physician.  Recommendations may be updated based on patient status, additional functional criteria and insurance authorization.  Follow Up Recommendations  Skilled nursing-short term rehab (<3 hours/day)     Assistance Recommended at Discharge Intermittent Supervision/Assistance  Equipment Recommendations  None recommended by PT    Recommendations for Other Services       Precautions / Restrictions Precautions Precautions: Fall Restrictions Weight Bearing Restrictions: No     Mobility  Bed Mobility Overal bed mobility: Needs Assistance Bed Mobility: Supine to Sit;Sit to Supine     Supine to sit: Mod assist;HOB elevated Sit to supine: Mod assist;HOB elevated   General bed mobility comments: assistance for trunk and intermittent LE support to sit upright. assistance for LE support to return to bed. verbal cues for technique. increased time and effort required. patient is mildly SOB with activity, Sp02  98-100% on room air    Transfers Overall transfer level: Needs assistance Equipment used: Rolling walker (2 wheels) Transfers: Sit to/from Stand Sit to Stand: Min assist           General transfer comment: lifting assistance required for standing. verbal cues for technique    Ambulation/Gait Ambulation/Gait assistance: Min assist;Min guard Gait Distance (Feet): 24 Feet Assistive device: Rolling walker (2 wheels) Gait Pattern/deviations: Decreased stride length;Trunk flexed Gait velocity: decreased     General Gait Details: patient ambulated in the room with occasional Min A for rolling walker negotiation and mild unsteadiness initially with walking. otherwise, Min guard provided for safety. mild dizziness reported in upright position. also mild shortness of breath noted with activity (patient reports this is chronic), however Sp02 100% after walking   Stairs             Wheelchair Mobility    Modified Rankin (Stroke Patients Only)       Balance   Sitting-balance support: Feet supported Sitting balance-Leahy Scale: Good     Standing balance support: Bilateral upper extremity supported Standing balance-Leahy Scale: Fair Standing balance comment: patient relying on rolling walker for support in standing                            Cognition Arousal/Alertness: Awake/alert Behavior During Therapy: WFL for tasks assessed/performed Overall Cognitive Status: Within Functional Limits for tasks assessed                                 General Comments: patient able to follow all commands without difficulty  Exercises      General Comments        Pertinent Vitals/Pain Pain Assessment: 0-10 Pain Score: 5  Pain Location: L ribs Pain Descriptors / Indicators: Discomfort Pain Intervention(s): Limited activity within patient's tolerance    Home Living                          Prior Function            PT  Goals (current goals can now be found in the care plan section) Acute Rehab PT Goals Patient Stated Goal: to go home PT Goal Formulation: With patient Time For Goal Achievement: 09/13/21 Potential to Achieve Goals: Good Progress towards PT goals: Progressing toward goals    Frequency    Min 2X/week      PT Plan Current plan remains appropriate    Co-evaluation              AM-PAC PT "6 Clicks" Mobility   Outcome Measure  Help needed turning from your back to your side while in a flat bed without using bedrails?: A Little Help needed moving from lying on your back to sitting on the side of a flat bed without using bedrails?: A Little Help needed moving to and from a bed to a chair (including a wheelchair)?: A Little Help needed standing up from a chair using your arms (e.g., wheelchair or bedside chair)?: A Little Help needed to walk in hospital room?: A Lot Help needed climbing 3-5 steps with a railing? : Total 6 Click Score: 15    End of Session   Activity Tolerance: Patient tolerated treatment well;Patient limited by fatigue Patient left: in bed;with call bell/phone within reach;with bed alarm set Nurse Communication: Mobility status PT Visit Diagnosis: Muscle weakness (generalized) (M62.81);History of falling (Z91.81);Difficulty in walking, not elsewhere classified (R26.2);Pain Pain - Right/Left: Left Pain - part of body:  (ribs)     Time: 8416-6063 PT Time Calculation (min) (ACUTE ONLY): 25 min  Charges:  $Therapeutic Activity: 23-37 mins                     Minna Merritts, PT, MPT    Percell Locus 09/01/2021, 12:53 PM

## 2021-09-01 NOTE — Consult Note (Signed)
[facility] Nurse Consult Note: Reason for Consult: Application of bilateral Unna's Boots.  No wounds. Wound type: N/A Pressure Injury POA: N/A Measurement:N/A Wound bed:N/A Drainage (amount, consistency, odor) N/A Periwound: hemosiderin staining. Dressing procedure/placement/frequency: Unna's boots applied after Nursing has washed and dried LEs. Patient tolerated procedure well.  Next dressing change is due on Monday, 09/08/21. [place] nurse to perform if patient is still in house.  [place] nursing team will follow, and will remain available to this patient, the nursing and medical teams.   Thanks, Maudie Flakes, MSN, RN, [place], Arther Abbott  Pager# [phone]

## 2021-09-01 NOTE — TOC Progression Note (Signed)
Transition of Care [facility]) - Progression Note    Patient Details  Name: Gary [name] MRN: [id number] Date of Birth: 19-Jul-1946  [facility] CM/SW [place], LCSW Phone Number: 09/01/2021, 2:35 PM  Clinical Narrative:   Per PT, patient now agreeable to SNF placement. No local bed offers. Asked [facility], [employer], and [facility] to review.  Expected Discharge Plan: [place] Barriers to Discharge: Continued Medical Work up  Expected Discharge Plan and Services Expected Discharge Plan: [place] arrangements for the past 2 months: Single Family Home                                       Social Determinants of Health (SDOH) Interventions    Readmission Risk Interventions Readmission Risk Prevention Plan 08/30/2021  Transportation Screening Complete  PCP or Specialist Appt within 3-5 Days Complete  HRI or [place] Complete  Social Work Consult for [place] Planning/Counseling Complete  Palliative Care Screening Not Applicable  Medication Review [employer]) Complete  Some recent data might be hidden

## 2021-09-02 DIAGNOSIS — E1122 Type 2 diabetes mellitus with diabetic chronic kidney disease: Secondary | ICD-10-CM

## 2021-09-02 DIAGNOSIS — E785 Hyperlipidemia, unspecified: Secondary | ICD-10-CM

## 2021-09-02 DIAGNOSIS — E039 Hypothyroidism, unspecified: Secondary | ICD-10-CM

## 2021-09-02 DIAGNOSIS — E162 Hypoglycemia, unspecified: Secondary | ICD-10-CM | POA: Diagnosis not present

## 2021-09-02 DIAGNOSIS — N189 Chronic kidney disease, unspecified: Secondary | ICD-10-CM

## 2021-09-02 DIAGNOSIS — I110 Hypertensive heart disease with heart failure: Secondary | ICD-10-CM | POA: Diagnosis not present

## 2021-09-02 LAB — GLUCOSE, CAPILLARY
Glucose-Capillary: 149 mg/dL — ABNORMAL HIGH (ref 70–99)
Glucose-Capillary: 170 mg/dL — ABNORMAL HIGH (ref 70–99)
Glucose-Capillary: 170 mg/dL — ABNORMAL HIGH (ref 70–99)
Glucose-Capillary: 189 mg/dL — ABNORMAL HIGH (ref 70–99)

## 2021-09-02 MED ORDER — ENOXAPARIN SODIUM 60 MG/0.6ML IJ SOSY
0.5000 mg/kg | PREFILLED_SYRINGE | INTRAMUSCULAR | Status: DC
  Administered 2021-09-02 – 2021-09-12 (×11): 50 mg via SUBCUTANEOUS
  Filled 2021-09-02: qty 0.5
  Filled 2021-09-02 (×11): qty 0.6

## 2021-09-02 MED ORDER — FLUOXETINE HCL 20 MG PO CAPS
20.0000 mg | ORAL_CAPSULE | Freq: Every day | ORAL | Status: DC
  Administered 2021-09-02 – 2021-09-07 (×6): 20 mg via ORAL
  Filled 2021-09-02 (×6): qty 1

## 2021-09-02 MED ORDER — METHOCARBAMOL 750 MG PO TABS
750.0000 mg | ORAL_TABLET | Freq: Four times a day (QID) | ORAL | Status: DC
  Administered 2021-09-02 – 2021-09-06 (×19): 750 mg via ORAL
  Filled 2021-09-02 (×24): qty 1

## 2021-09-02 MED ORDER — INSULIN ASPART 100 UNIT/ML IJ SOLN
4.0000 [IU] | Freq: Three times a day (TID) | INTRAMUSCULAR | Status: DC
  Administered 2021-09-02 – 2021-09-03 (×4): 4 [IU] via SUBCUTANEOUS
  Filled 2021-09-02 (×4): qty 1

## 2021-09-02 MED ORDER — POLYETHYLENE GLYCOL 3350 17 G PO PACK
17.0000 g | PACK | Freq: Two times a day (BID) | ORAL | Status: DC
  Administered 2021-09-02 – 2021-09-05 (×4): 17 g via ORAL
  Filled 2021-09-02 (×8): qty 1

## 2021-09-02 NOTE — Care Management Important Message (Signed)
Important Message  Patient Details  Name: Gary [name] MRN: [id number] Date of Birth: Mar 31, 1946   Medicare Important Message Given:  Yes     [name] 09/02/2021, 12:39 PM

## 2021-09-02 NOTE — Progress Notes (Signed)
PROGRESS NOTE    Gary GLATFELTER  ZSW:[id number] DOB: 07-19-1946 DOA: 08/29/2021 PCP: [name], MD    Brief Narrative:  75 y.o. male with medical history significant of type 1 diabetes on insulin pump, hypertension, hyperlipidemia, COPD, GERD, hypothyroidism, depression, peripheral neuropathy, gastroparesis, mantle cell lymphoma, hypothyroidism, Graves' disease, CHF, s/p of nephrectomy, CAD, CKD-3A, SSS, s/p of pacemaker placement, iron deficiency anemia, lymphedema, who presents with fall.   Pt is a poor historian.  History is limited.  Per report, patient recently lost his wife.  He lives alone at home.  Patient fell yesterday, and injured his left chest wall.  Complains of left rib cage pain.  He states that he cannot take deep breaths because of pain.  Initially patient was drowsy, seems to be altered, but when I saw patient in the ED, he is alert, oriented x3.  He moves all extremities normally.  No facial droop or slurred speech.  He states that he has mild shortness of breath and left side of chest wall pain.  No cough, fever or chills.  Denies nausea vomiting, diarrhea or abdominal pain.  No symptoms of UTI.  Patient has lymphedema and venous ulcer in legs. Both legs are wrapped up.  Of note, patient had fall and was seen in ED on 11/14. He had skin laceration in left hand which was sutured up. He had unremarkable CTs of his head, face and cervical spine that time.   Patient was found to have hypoglycemia with blood sugar 19, insulin pump with discontinued.  Seen in consultation by diabetes coordinator.  Recommendations appreciated.  At this time insulin pump has been discontinued.  Patient on basal bolus subcutaneous insulin regimen.  Mental status is improving.  Still with significant pain, mainly in rib   Assessment & Plan:   Principal Problem:   Hypoglycemia Active Problems:   Chest pain   Hypercholesterolemia   Fall   Iron deficiency anemia   CKD (chronic kidney disease),  stage IIIa   Chronic systolic CHF (congestive heart failure) (HCC)   Hypokalemia   Recurrent falls   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   Left rib fracture_7th rib   Leukocytosis   HTN (hypertension)   Hypothyroid   CAD (coronary artery disease)  Severe symptomatic hypoglycemia blood sugar was 19 on admission Insulin pump was discontinued Unclear nature, question pulmonary function Patient was treated with D50 and D10 gtt Diabetes coordinator consulted Plan: Continue Semglee 15 units daily NovoLog 4 units 3 times daily with meals SSI Carb modified diet Appreciate diabetes coordinator follow-up Recommend basal bolus to be continued on discharge.  Patient planning to discharge to skilled nursing facility   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  recent D2K 7.0 -Sliding scale insulin -Glargine insulin 15 unit daily -NovoLog 4 units 3 times daily with meals   Chest pain  Chest pain has resolved.  Troponins unremarkable -Continue pravastatin -As needed EKG chest pain   Hypercholesterolemia -Pravastatin   Fall-recurrent -PT/OT, recommend SNF -Fall precaution   Iron deficiency anemia  Hemoglobin stable, 13.7 -Continue iron supplement   CKD (chronic kidney disease), stage IIIa  Close to baseline.  Baseline creatinine 1.2-1.4 Avoid nephrotoxins   Chronic systolic CHF (congestive heart failure) (HCC)  BNP 198, no respiratory distress.   Not acutely exacerbated -Continue Lasix -Continue Coreg   Hypokalemia -Monitor and replace as necessary   Left rib fracture_7th rib -Pain control: As needed Percocet, Tylenol, morphine -Incentive spirometry   Leukocytosis No source  of infection identified.   No fever.  Urinalysis negative.  Likely reactive    HTN (hypertension) -IV hydralazine as needed -Restarted Coreg 11/20 -Restarted Lasix 11/21   Hypothyroid -Continue Synthroid   CAD (coronary artery disease): -Continue pravastatin    Skin laceration in left hand: -Wound care consult, recommendations appreciated  Depression Complicated grief Patient lost his wife of 50+  years 1 year ago Per patients sister, he has been very depressed over the past year Was previously on Prozac, which I feel is a reasonable medication to restart Plan: Start Prozac 20 mg daily Will need outpatient follow-up with PCP to discuss dose titration and response to medication   DVT prophylaxis: SQ Lovenox Code Status: Full Family Communication: Lorin Picket [phone] on 11/20, 11/22 Disposition Plan: Status is: Inpatient   Remains inpatient appropriate because: Symptomatic severe hypoglycemic episode and resultant fall.     Level of care: Med-Surg  Consultants:  Diabetes coordinator  Procedures:  None  Antimicrobials: None    Subjective: Seen and examined.  Continues to endorse pain.  Depressed mood  Objective: Vitals:   09/01/21 1606 09/01/21 2011 09/02/21 0603 09/02/21 0741  BP: 133/70 123/64 (!) 110/49 (!) 106/58  Pulse: 96 99 65 73  Resp: 16 18 18 16   Temp: 98.8 F (37.1 C) 98.1 F (36.7 C) 99.3 F (37.4 C) 98.7 F (37.1 C)  TempSrc: Oral Oral Oral Oral  SpO2: 100% 100% 97% 97%  Weight:      Height:        Intake/Output Summary (Last 24 hours) at 09/02/2021 1042 Last data filed at 09/02/2021 0500 Gross per 24 hour  Intake 360 ml  Output 2550 ml  Net -2190 ml   Filed Weights   08/30/21 0413  Weight: 102.1 kg    Examination:  General exam: Sleepy.  Appears fatigued Respiratory system: Lungs clear, normal WOB, Room air Cardiovascular system: S1S2, RRR, no murmurs, non pitting BL LE edema Gastrointestinal system: Soft, NT/ND, normal bowel sounds Central nervous system: Alert and oriented. No focal neurological deficits. Extremities: Diffusely decreased power BLE Skin: Bilateral periorbital ecchymoses.  Scattered ecchymoses Psychiatry: Judgement and insight appear impaired. Mood & affect  flattened.     Data Reviewed: I have personally reviewed following labs and imaging studies  CBC: Recent Labs  Lab 08/29/21 0020 08/30/21 0514  WBC 12.2* 6.4  HGB 13.7 11.7*  HCT 41.7 35.0*  MCV 93.3 93.8  PLT 170 267*   Basic Metabolic Panel: Recent Labs  Lab 08/29/21 0020 08/30/21 0514  NA 145 138  K 3.1* 3.8  CL 107 108  CO2 25 20*  GLUCOSE 43* 178*  BUN 28* 38*  CREATININE 1.46* 1.59*  CALCIUM 8.5* 7.7*  MG 2.0  --    GFR: Estimated Creatinine Clearance: 50.3 mL/min (A) (by C-G formula based on SCr of 1.59 mg/dL (H)). Liver Function Tests: Recent Labs  Lab 08/29/21 0020  AST 17  ALT 7  ALKPHOS 108  BILITOT 0.5  PROT 7.2  ALBUMIN 3.7   No results for input(s): LIPASE, AMYLASE in the last 168 hours. Recent Labs  Lab 08/29/21 0020  AMMONIA 19   Coagulation Profile: Recent Labs  Lab 08/29/21 0020  INR 1.2   Cardiac Enzymes: No results for input(s): CKTOTAL, CKMB, CKMBINDEX, TROPONINI in the last 168 hours. BNP (last 3 results) No results for input(s): PROBNP in the last 8760 hours. HbA1C: No results for input(s): HGBA1C in the last 72 hours. CBG: Recent Labs  Lab 09/01/21 [phone]  09/01/21 1141 09/01/21 1625 09/01/21 2012 09/02/21 0759  GLUCAP 175* 235* 254* 255* 149*   Lipid Profile: No results for input(s): CHOL, HDL, LDLCALC, TRIG, CHOLHDL, LDLDIRECT in the last 72 hours. Thyroid Function Tests: No results for input(s): TSH, T4TOTAL, FREET4, T3FREE, THYROIDAB in the last 72 hours.  Anemia Panel: No results for input(s): VITAMINB12, FOLATE, FERRITIN, TIBC, IRON, RETICCTPCT in the last 72 hours. Sepsis Labs: No results for input(s): PROCALCITON, LATICACIDVEN in the last 168 hours.  Recent Results (from the past 240 hour(s))  Resp Panel by RT-PCR (Flu A&B, Covid) Nasopharyngeal Swab     Status: None   Collection Time: 08/29/21 11:04 AM   Specimen: Nasopharyngeal Swab; Nasopharyngeal(NP) swabs in vial transport medium  Result Value Ref  Range Status   SARS Coronavirus 2 by RT PCR NEGATIVE NEGATIVE Final    Comment: (NOTE) SARS-CoV-2 target nucleic acids are NOT DETECTED.  The SARS-CoV-2 RNA is generally detectable in upper respiratory specimens during the acute phase of infection. The lowest concentration of SARS-CoV-2 viral copies this assay can detect is 138 copies/mL. A negative result does not preclude SARS-Cov-2 infection and should not be used as the sole basis for treatment or other patient management decisions. A negative result may occur with  improper specimen collection/handling, submission of specimen other than nasopharyngeal swab, presence of viral mutation(s) within the areas targeted by this assay, and inadequate number of viral copies(<138 copies/mL). A negative result must be combined with clinical observations, patient history, and epidemiological information. The expected result is Negative.  Fact Sheet for Patients:  EntrepreneurPulse.com.au  Fact Sheet for Healthcare Providers:  IncredibleEmployment.be  This test is no t yet approved or cleared by the [place] FDA and  has been authorized for detection and/or diagnosis of SARS-CoV-2 by FDA under an Emergency Use Authorization (EUA). This EUA will remain  in effect (meaning this test can be used) for the duration of the COVID-19 declaration under Section 564(b)(1) of the Act, 21 U.S.C.section 360bbb-3(b)(1), unless the authorization is terminated  or revoked sooner.       Influenza A by PCR NEGATIVE NEGATIVE Final   Influenza B by PCR NEGATIVE NEGATIVE Final    Comment: (NOTE) The Xpert Xpress SARS-CoV-2/FLU/RSV plus assay is intended as an aid in the diagnosis of influenza from Nasopharyngeal swab specimens and should not be used as a sole basis for treatment. Nasal washings and aspirates are unacceptable for Xpert Xpress SARS-CoV-2/FLU/RSV testing.  Fact Sheet for  Patients: EntrepreneurPulse.com.au  Fact Sheet for Healthcare Providers: IncredibleEmployment.be  This test is not yet approved or cleared by the [place] FDA and has been authorized for detection and/or diagnosis of SARS-CoV-2 by FDA under an Emergency Use Authorization (EUA). This EUA will remain in effect (meaning this test can be used) for the duration of the COVID-19 declaration under Section 564(b)(1) of the Act, 21 U.S.C. section 360bbb-3(b)(1), unless the authorization is terminated or revoked.  Performed at [facility], [address] [place], [place] [address]          Radiology Studies: No results found.      Scheduled Meds:  bacitracin   Topical BID   carvedilol  3.125 mg Oral BID WC   cholecalciferol  2,000 Units Oral Daily   ferrous sulfate  325 mg Oral Q breakfast   fluticasone  2 spray Each Nare Daily   furosemide  40 mg Oral Daily   insulin aspart  0-5 Units Subcutaneous QHS   insulin aspart  0-9  Units Subcutaneous TID WC   insulin aspart  4 Units Subcutaneous TID WC   insulin glargine-yfgn  15 Units Subcutaneous Daily   levothyroxine  25 mcg Oral QAC breakfast   lidocaine  1 patch Transdermal Q24H   methocarbamol  750 mg Oral QID   ondansetron (ZOFRAN) IV  4 mg Intravenous Once   polyethylene glycol  17 g Oral BID   pravastatin  40 mg Oral Daily   vitamin B-12  1,000 mcg Oral Daily   Continuous Infusions:   LOS: 4 days    Time spent: 25 minutes    Sidney Ace, MD [facility]   If 7PM-7AM, please contact night-coverage  09/02/2021, 10:42 AM

## 2021-09-02 NOTE — Progress Notes (Signed)
Physical Therapy Treatment Patient Details Name: Gary [name] MRN: [id number] DOB: 12/09/1945 Today's Date: 09/02/2021   History of Present Illness Pt. is  75 y.o. male who was admitted with hypoglycemia, resulting in a fall. Pt. sustained a  L 7th rib fracture, and multiple lacerations. PMHx includes: DM, HTN, HLD, COPD, GERD, Depression, lymphoma, CHF, PPM.    PT Comments    Pt is making gradual progress towards goals with ability to ambulate slightly improved distance this session. Still needs hands on assist as pt is very high falls risk. Has unsteadiness particularly with turns while using RW needing cues for sequencing. RW used with quick fatigue. Would be unable to ambulate household distances safely at this time. Pt reports he prefers not to sit in recliner and rather return to bed after gait training. Good endurance with seated there-ex, keeping O2 sats WNL with exertion. Still remains limited by L rib pain making bed mobility difficult to perform. Will continue to progress as able.   Recommendations for follow up therapy are one component of a multi-disciplinary discharge planning process, led by the attending physician.  Recommendations may be updated based on patient status, additional functional criteria and insurance authorization.  Follow Up Recommendations  Skilled nursing-short term rehab (<3 hours/day)     Assistance Recommended at Discharge Intermittent Supervision/Assistance  Equipment Recommendations  None recommended by PT    Recommendations for Other Services       Precautions / Restrictions Precautions Precautions: Fall Restrictions Weight Bearing Restrictions: No     Mobility  Bed Mobility Overal bed mobility: Needs Assistance Bed Mobility: Supine to Sit;Sit to Supine     Supine to sit: Mod assist;HOB elevated Sit to supine: Mod assist;HOB elevated   General bed mobility comments: cues for sequencing. Very slow transition to sitting at EOB with  increased effort required    Transfers Overall transfer level: Needs assistance Equipment used: Rolling walker (2 wheels) Transfers: Sit to/from Stand Sit to Stand: Min assist           General transfer comment: cues for hand placement. once standing, forward flexed posture. Rocking momentum required prior to standing.    Ambulation/Gait Ambulation/Gait assistance: Min assist;Min guard Gait Distance (Feet): 40 Feet Assistive device: Rolling walker (2 wheels) Gait Pattern/deviations: Decreased stride length;Trunk flexed       General Gait Details: ambulated slightly increased distance this date with use of RW. Has trouble with turns with 1 LOB needing hands on assist. Cues for safety/sequencing. Fatigues quickly. O2 sats at 97% post exertion   Stairs             Wheelchair Mobility    Modified Rankin (Stroke Patients Only)       Balance Overall balance assessment: History of Falls;Needs assistance Sitting-balance support: Feet supported Sitting balance-Leahy Scale: Good     Standing balance support: Bilateral upper extremity supported Standing balance-Leahy Scale: Fair                              Cognition Arousal/Alertness: Awake/alert Behavior During Therapy: WFL for tasks assessed/performed Overall Cognitive Status: Within Functional Limits for tasks assessed                                          Exercises Other Exercises Other Exercises: Seated ther-ex performed on B LE including LAQ, heel  raises, toe raises, and alt. marching. All ther-ex performed x 10 reps with supervision and safe technique.    General Comments        Pertinent Vitals/Pain Pain Assessment: Faces Faces Pain Scale: Hurts little more Pain Location: Left ribs Pain Descriptors / Indicators: Aching Pain Intervention(s): Limited activity within patient's tolerance    Home Living                          Prior Function             PT Goals (current goals can now be found in the care plan section) Acute Rehab PT Goals Patient Stated Goal: to go home PT Goal Formulation: With patient Time For Goal Achievement: 09/13/21 Potential to Achieve Goals: Good Progress towards PT goals: Progressing toward goals    Frequency    Min 2X/week      PT Plan Current plan remains appropriate    Co-evaluation              AM-PAC PT "6 Clicks" Mobility   Outcome Measure  Help needed turning from your back to your side while in a flat bed without using bedrails?: A Little Help needed moving from lying on your back to sitting on the side of a flat bed without using bedrails?: A Little Help needed moving to and from a bed to a chair (including a wheelchair)?: A Little Help needed standing up from a chair using your arms (e.g., wheelchair or bedside chair)?: A Little Help needed to walk in hospital room?: A Lot Help needed climbing 3-5 steps with a railing? : Total 6 Click Score: 15    End of Session   Activity Tolerance: Patient tolerated treatment well;Patient limited by fatigue Patient left: in bed;with call bell/phone within reach;with bed alarm set Nurse Communication: Mobility status PT Visit Diagnosis: Muscle weakness (generalized) (M62.81);History of falling (Z91.81);Difficulty in walking, not elsewhere classified (R26.2);Pain Pain - Right/Left: Left Pain - part of body:  (ribs)     Time: 0981-1914 PT Time Calculation (min) (ACUTE ONLY): 23 min  Charges:  $Gait Training: 8-22 mins $Therapeutic Exercise: 8-22 mins                     Greggory Stallion, PT, DPT [phone]    Ivoree Felmlee 09/02/2021, 3:55 PM

## 2021-09-02 NOTE — TOC Progression Note (Signed)
Transition of Care [facility]) - Progression Note    Patient Details  Name: Gary [name] MRN: [id number] Date of Birth: [birth year]  Transition of Care [facility]) CM/SW [place], LCSW Phone Number: 09/02/2021, 3:31 PM  Clinical Narrative:   Met with patient to provide bed offers. He said he wants to go home at discharge. Explained concerns with weakness. Patient voiced that he does not trust social workers due to past bad experiences. He gave CSW permission to contact his sister. Called and gave her bed offers. She will research tonight but said they are leaning towards [facility]. Notified her that patient would have to use insulin syringes rather than pump at SNF.  Expected Discharge Plan: [place] Barriers to Discharge: Continued Medical Work up  Expected Discharge Plan and Services Expected Discharge Plan: [place] arrangements for the past 2 months: Single Family Home                                       Social Determinants of Health (SDOH) Interventions    Readmission Risk Interventions Readmission Risk Prevention Plan 08/30/2021  Transportation Screening Complete  PCP or Specialist Appt within 3-5 Days Complete  HRI or [place] Complete  Social Work Consult for [place] Planning/Counseling Complete  Palliative Care Screening Not Applicable  Medication Review Press photographer) Complete  Some recent data might be hidden

## 2021-09-02 NOTE — Progress Notes (Signed)
Inpatient Diabetes Program Recommendations  AACE/ADA: New Consensus Statement on Inpatient Glycemic Control   Target Ranges:  Prepandial:   less than 140 mg/dL      [facility] postprandial:   less than 180 mg/dL (1-2 hours)      Critically ill patients:  140 - 180 mg/dL    Latest Reference Range & Units 09/01/21 08:37 09/01/21 11:41 09/01/21 16:25 09/01/21 20:12  Glucose-Capillary 70 - 99 mg/dL 175 (H)  Novolog 2 units Semglee 15 units  235 (H)  Novolog 3 units 254 (H)  Novolog 5 units 255 (H)  Novolog 3 units    Latest Reference Range & Units 08/31/21 09:03 08/31/21 11:58 08/31/21 16:22 08/31/21 21:37  Glucose-Capillary 70 - 99 mg/dL 154 (H) 126 (H) 140 (H) 213 (H)  (H): Data is abnormally high  Review of Glycemic Control  Diabetes history: DM1 Outpatient Diabetes medications: Medtronic 630G Insulin Pump with Novolog; Dexcom CGM Current orders for Inpatient glycemic control: Semglee 15 units daily, Novolog 0-9 units TID with meals, Novolog 0-5 units QHS  Inpatient Diabetes Program Recommendations:    Insulin: Please consider ordering Novolog 4 units TID with meals for meal coverage if patient eats at least 50% of meals.  Outpatient: Would recommend discharging patient on SQ insulin regimen (will need basal, meal coverage, and correction scale since patient makes NO insulin) similar to regimen used while inpatient and have patient follow up with Dr. Honor Junes to discuss resuming insulin pump if appropriate.  NOTE: Per chart review Endocrinologist: Dr. Honor Junes with [facility]; Last seen 08/12/2021 Pump settings at that visit were as follows (no changes made): Basal rates Midnight = 0.675 6 AM = 0.8 12 PM = 0.75 9 PM = 0.775 TDD basal: 17.925 units  Bolus settings I/C: 9 ISF: 60 Target Glucose: 100-130 Active insulin time: 3 hours  Thanks, Barnie Alderman, RN, MSN, CDE Diabetes Coordinator Inpatient Diabetes Program [phone] (Team Pager from 8am to 5pm)

## 2021-09-03 ENCOUNTER — Encounter (INDEPENDENT_AMBULATORY_CARE_PROVIDER_SITE_OTHER): Payer: Medicare Other

## 2021-09-03 DIAGNOSIS — E162 Hypoglycemia, unspecified: Secondary | ICD-10-CM | POA: Diagnosis not present

## 2021-09-03 LAB — GLUCOSE, CAPILLARY
Glucose-Capillary: 253 mg/dL — ABNORMAL HIGH (ref 70–99)
Glucose-Capillary: 284 mg/dL — ABNORMAL HIGH (ref 70–99)
Glucose-Capillary: 172 mg/dL — ABNORMAL HIGH (ref 70–99)
Glucose-Capillary: 264 mg/dL — ABNORMAL HIGH (ref 70–99)

## 2021-09-03 LAB — CBC
HCT: 39.3 % (ref 39.0–52.0)
Hemoglobin: 13.3 g/dL (ref 13.0–17.0)
MCH: 30.9 pg (ref 26.0–34.0)
MCHC: 33.8 g/dL (ref 30.0–36.0)
MCV: 91.2 fL (ref 80.0–100.0)
Platelets: 94 10*3/uL — ABNORMAL LOW (ref 150–400)
RBC: 4.31 MIL/uL (ref 4.22–5.81)
RDW: 13 % (ref 11.5–15.5)
WBC: 3.5 10*3/uL — ABNORMAL LOW (ref 4.0–10.5)
nRBC: 0 % (ref 0.0–0.2)

## 2021-09-03 LAB — RESP PANEL BY RT-PCR (FLU A&B, COVID) ARPGX2
Influenza A by PCR: NEGATIVE
Influenza A by PCR: NEGATIVE
Influenza B by PCR: NEGATIVE
Influenza B by PCR: NEGATIVE
SARS Coronavirus 2 by RT PCR: POSITIVE — AB
SARS Coronavirus 2 by RT PCR: POSITIVE — AB

## 2021-09-03 LAB — BASIC METABOLIC PANEL
Anion gap: 8 (ref 5–15)
BUN: 36 mg/dL — ABNORMAL HIGH (ref 8–23)
CO2: 25 mmol/L (ref 22–32)
Calcium: 8.1 mg/dL — ABNORMAL LOW (ref 8.9–10.3)
Chloride: 98 mmol/L (ref 98–111)
Creatinine, Ser: 1.39 mg/dL — ABNORMAL HIGH (ref 0.61–1.24)
GFR, Estimated: 54 mL/min — ABNORMAL LOW (ref >60)
Glucose, Bld: 278 mg/dL — ABNORMAL HIGH (ref 70–99)
Potassium: 4.8 mmol/L (ref 3.5–5.1)
Sodium: 131 mmol/L — ABNORMAL LOW (ref 135–145)

## 2021-09-03 LAB — MAGNESIUM: Magnesium: 2 mg/dL (ref 1.7–2.4)

## 2021-09-03 MED ORDER — LIDOCAINE 5 % EX PTCH: 1.0000 | MEDICATED_PATCH | CUTANEOUS | 0 refills | Status: DC

## 2021-09-03 MED ORDER — SALINE SPRAY 0.65 % NA SOLN
1.0000 | NASAL | Status: DC | PRN
  Administered 2021-09-04 – 2021-09-12 (×3): 1 via NASAL
  Filled 2021-09-03: qty 44

## 2021-09-03 MED ORDER — INSULIN ASPART 100 UNIT/ML IJ SOLN: 6.0000 [IU] | Freq: Three times a day (TID) | INTRAMUSCULAR | Status: DC

## 2021-09-03 MED ORDER — CARVEDILOL 3.125 MG PO TABS: 3.1250 mg | ORAL_TABLET | Freq: Two times a day (BID) | ORAL | Status: DC

## 2021-09-03 MED ORDER — INSULIN GLARGINE-YFGN 100 UNIT/ML ~~LOC~~ SOLN
16.0000 [IU] | Freq: Every day | SUBCUTANEOUS | Status: DC
  Administered 2021-09-04 – 2021-09-05 (×2): 16 [IU] via SUBCUTANEOUS
  Filled 2021-09-03 (×2): qty 0.16

## 2021-09-03 MED ORDER — INSULIN GLARGINE-YFGN 100 UNIT/ML ~~LOC~~ SOLN: 16.0000 [IU] | Freq: Every day | SUBCUTANEOUS | Status: DC

## 2021-09-03 MED ORDER — FUROSEMIDE 40 MG PO TABS: 40.0000 mg | ORAL_TABLET | Freq: Every day | ORAL | Status: DC

## 2021-09-03 MED ORDER — POLYETHYLENE GLYCOL 3350 17 G PO PACK: 17.0000 g | PACK | Freq: Two times a day (BID) | ORAL | 0 refills | Status: DC

## 2021-09-03 MED ORDER — INSULIN ASPART 100 UNIT/ML IJ SOLN: 0.0000 [IU] | Freq: Every day | INTRAMUSCULAR | Status: DC

## 2021-09-03 MED ORDER — INSULIN ASPART 100 UNIT/ML IJ SOLN
6.0000 [IU] | Freq: Three times a day (TID) | INTRAMUSCULAR | Status: DC
  Administered 2021-09-04 (×3): 6 [IU] via SUBCUTANEOUS
  Filled 2021-09-03 (×5): qty 1

## 2021-09-03 MED ORDER — METHOCARBAMOL 750 MG PO TABS
750.0000 mg | ORAL_TABLET | Freq: Four times a day (QID) | ORAL | Status: DC | PRN
Start: 2021-09-03 — End: 2022-01-26

## 2021-09-03 MED ORDER — POLYVINYL ALCOHOL 1.4 % OP SOLN: 1.0000 [drp] | OPHTHALMIC | 0 refills | Status: DC | PRN

## 2021-09-03 MED ORDER — INSULIN ASPART 100 UNIT/ML IJ SOLN: 0.0000 [IU] | Freq: Three times a day (TID) | INTRAMUSCULAR | Status: DC

## 2021-09-03 MED ORDER — FLUOXETINE HCL 20 MG PO CAPS: 20.0000 mg | ORAL_CAPSULE | Freq: Every day | ORAL | 3 refills | Status: DC

## 2021-09-03 NOTE — TOC Progression Note (Signed)
Transition of Care [facility]) - Progression Note    Patient Details  Name: Gary [name] MRN: [id number] Date of Birth: [birth year]  Transition of Care [facility]) CM/SW Contact  Shelbie Hutching, RN Phone Number: 09/03/2021, 4:17 PM  Clinical Narrative:    Patient and sister did agree with going to [facility].  [facility] was [facility] today but COVID test came back positive, MD thought it may be false positive so she ordered another test, it also came back positive.  [facility] cannot accept at this time but will follow up on Monday.  Patient's sister updated via phone.     Expected Discharge Plan: [place] Barriers to Discharge: Continued Medical Work up  Expected Discharge Plan and Services Expected Discharge Plan: [place] In-house Referral: Clinical Social Work Discharge Planning Services: CM Consult Post Acute Care Choice: [place] Living arrangements for the past 2 months: Single Family Home Expected Discharge Date: 09/03/21               DME Arranged: N/A DME Agency: NA       HH Arranged: NA HH Agency: NA         Social Determinants of Health (SDOH) Interventions    Readmission Risk Interventions Readmission Risk Prevention Plan 08/30/2021  Transportation Screening Complete  PCP or Specialist Appt within 3-5 Days Complete  HRI or [place] Complete  Social Work Consult for [place] Planning/Counseling Complete  Palliative Care Screening Not Applicable  Medication Review Press photographer) Complete  Some recent data might be hidden

## 2021-09-03 NOTE — Progress Notes (Signed)
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 male with medical history significant of type 1 diabetes on insulin pump, hypertension, COPD, hypothyroidism, depression, peripheral neuropathy, gastroparesis, mantle cell lymphoma, hypothyroidism, Graves' disease, CHF, s/p of nephrectomy, CAD, CKD-3A, SSS, s/p of pacemaker placement, iron deficiency anemia, lymphedema, who presented with fall.   Pt is a poor historian.  History was limited.  Per report, patient recently lost his wife.  He lives alone at home.  Patient fell, and injured his left chest wall.  Complained of left rib cage pain.  He stated that he couldn't take deep breaths because of pain.     Of note, patient had fall and was seen in ED on 11/14. He had skin laceration in left hand which was sutured up. He had unremarkable CTs of his head, face and cervical spine that time.   Patient was found to have hypoglycemia with blood sugar 19 on presentation.  Insulin pump discontinued.  Pt transitioned to subQ insulin.   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  Insulin pump d/c'ed.   Diabetes coordinator consulted.   Recent A1c 6.9.   --cont glargine 16u daily --cont mealtime 6u TID  --cont SSI ACHS.   --patient to follow up with Dr. Honor Junes to discuss resuming insulin pump if appropriate after he is discharged  from rehab facility.   Chest pain 2/2  Left rib fracture_7th rib Chest pain has resolved.   Trop neg. -Incentive spirometry   Hypercholesterolemia --cont statin   Fall-recurrent -PT/OT, recommend SNF   Iron deficiency anemia  Hemoglobin stable, 13.7 -Continue iron supplement   CKD (chronic kidney disease), stage IIIa Stable.  Baseline creatinine 1.7-4.0   Chronic systolic CHF (congestive heart failure) (HCC)  BNP 198, no respiratory distress.  Not acutely exacerbated --cont lasix 40 mg daily -Continue Coreg, with holding parameter for systolic <814 or HR <48.   Hypokalemia -Monitored and replaced as necessary   Leukocytosis No source of infection identified.   No fever.  Urinalysis negative.  Likely reactive   HTN (hypertension) BP mostly low normal.   -Continue Lasix as 40 mg daily -Continue Coreg, with holding parameter for systolic <185 or HR <63.   Hypothyroid -Continue Synthroid   CAD (coronary artery disease): -Continue pravastatin   Skin laceration in left hand: -Wound care consult, wound care per order as above.   Depression Complicated grief Patient lost his wife of 50+  years 1 year ago Per patients sister, he has been very depressed over the past year Was previously on Prozac, which was restarted during this hospitalization.   --cont Prozac --Will need outpatient follow-up with PCP to discuss dose titration and response to medication  COVID infection, asymptomatic --pos on 11/23 --no need to treat   DVT prophylaxis: Lovenox SQ Code Status: Full code  Family Communication:  Level of care: Med-Surg Dispo:   The patient is from: home Anticipated d/c is to: SNF Anticipated d/c date is: after 10 days of isolation  Patient currently is medically ready to d/c, however, couldn't go to SNF due to new COVID infection.   Subjective and Interval History:  No new issues or complaints.    Pt was getting ready to be discharged to SNF when COVID  returned positive.   Objective: Vitals:   09/03/21 0801 09/03/21 1203 09/03/21 1618 09/03/21 2104  BP: (!) 97/58 117/67 108/71 (!) 121/52  Pulse: (!) 59 64 60 66  Resp: 15 18 16 18   Temp: 97.7 F (36.5 C) (!) 97.4 F (36.3 C) 97.9 F (36.6 C) 98.1 F (36.7 C)  TempSrc: Oral  Oral Oral  SpO2: 97% 98% 100% 100%  Weight:      Height:        Intake/Output Summary (Last 24 hours) at 09/04/2021 0413 Last data filed at 09/03/2021 1410 Gross per 24 hour  Intake 720 ml  Output 900 ml  Net -180 ml   Filed Weights   08/30/21 0413  Weight: 102.1 kg    Examination:   Constitutional: NAD, AAOx3 HEENT: conjunctivae and lids normal, EOMI CV: No cyanosis.   RESP: normal respiratory effort, on RA Extremities: Unna boots in BLE SKIN: warm, dry, extensive bruising Neuro: II - XII grossly intact.   Psych: flat mood and affect.     Data Reviewed: I have personally reviewed following labs and imaging studies  CBC: Recent Labs  Lab 08/29/21 0020 08/30/21 0514 09/03/21 1222  WBC 12.2* 6.4 3.5*  HGB 13.7 11.7* 13.3  HCT 41.7 35.0* 39.3  MCV 93.3 93.8 91.2  PLT 170 123* 94*   Basic Metabolic Panel: Recent Labs  Lab 08/29/21 0020 08/30/21 0514 09/03/21 1222  NA 145 138 131*  K 3.1* 3.8 4.8  CL 107 108 98  CO2 25 20* 25  GLUCOSE 43* 178* 278*  BUN 28* 38* 36*  CREATININE 1.46* 1.59* 1.39*  CALCIUM 8.5* 7.7* 8.1*  MG 2.0  --  2.0   GFR: Estimated Creatinine Clearance: 57.6 mL/min (A) (by C-G formula based on SCr of 1.39 mg/dL (H)). Liver Function Tests: Recent Labs  Lab 08/29/21 0020  AST 17  ALT 7  ALKPHOS 108  BILITOT 0.5  PROT 7.2  ALBUMIN 3.7   No results for input(s): LIPASE, AMYLASE in the last 168 hours. Recent Labs  Lab 08/29/21 0020  AMMONIA 19   Coagulation Profile: Recent Labs  Lab 08/29/21 0020  INR 1.2   Cardiac Enzymes: No results for input(s): CKTOTAL, CKMB, CKMBINDEX, TROPONINI in the last 168 hours. BNP (last 3 results) No  results for input(s): PROBNP in the last 8760 hours. HbA1C: No results for input(s): HGBA1C in the last 72 hours. CBG: Recent Labs  Lab 09/02/21 2122 09/03/21 0758 09/03/21 1201 09/03/21 1634 09/03/21 2101  GLUCAP 189* 253* 284* 172* 264*   Lipid Profile: No results for input(s): CHOL, HDL, LDLCALC, TRIG, CHOLHDL, LDLDIRECT in the last 72 hours. Thyroid Function Tests: No results for input(s): TSH, T4TOTAL, FREET4, T3FREE, THYROIDAB in the last 72 hours. Anemia Panel: No results for input(s): VITAMINB12, FOLATE, FERRITIN, TIBC, IRON, RETICCTPCT in the last 72 hours. Sepsis Labs: No results for input(s): PROCALCITON, LATICACIDVEN in the last 168 hours.  Recent Results (from the past 240 hour(s))  Resp Panel by RT-PCR (Flu A&B, Covid) Nasopharyngeal Swab     Status: None   Collection Time: 08/29/21  11:04 AM   Specimen: Nasopharyngeal Swab; Nasopharyngeal(NP) swabs in vial transport medium  Result Value Ref Range Status   SARS Coronavirus 2 by RT PCR NEGATIVE NEGATIVE Final    Comment: (NOTE) SARS-CoV-2 target nucleic acids are NOT DETECTED.  The SARS-CoV-2 RNA is generally detectable in upper respiratory specimens during the acute phase of infection. The lowest concentration of SARS-CoV-2 viral copies this assay can detect is 138 copies/mL. A negative result does not preclude SARS-Cov-2 infection and should not be used as the sole basis for treatment or other patient management decisions. A negative result may occur with  improper specimen collection/handling, submission of specimen other than nasopharyngeal swab, presence of viral mutation(s) within the areas targeted by this assay, and inadequate number of viral copies(<138 copies/mL). A negative result must be combined with clinical observations, patient history, and epidemiological information. The expected result is Negative.  Fact Sheet for Patients:  EntrepreneurPulse.com.au  Fact Sheet for  Healthcare Providers:  IncredibleEmployment.be  This test is no t yet approved or cleared by the [place] FDA and  has been authorized for detection and/or diagnosis of SARS-CoV-2 by FDA under an Emergency Use Authorization (EUA). This EUA will remain  in effect (meaning this test can be used) for the duration of the COVID-19 declaration under Section 564(b)(1) of the Act, 21 U.S.C.section 360bbb-3(b)(1), unless the authorization is terminated  or revoked sooner.       Influenza A by PCR NEGATIVE NEGATIVE Final   Influenza B by PCR NEGATIVE NEGATIVE Final    Comment: (NOTE) The Xpert Xpress SARS-CoV-2/FLU/RSV plus assay is intended as an aid in the diagnosis of influenza from Nasopharyngeal swab specimens and should not be used as a sole basis for treatment. Nasal washings and aspirates are unacceptable for Xpert Xpress SARS-CoV-2/FLU/RSV testing.  Fact Sheet for Patients: EntrepreneurPulse.com.au  Fact Sheet for Healthcare Providers: IncredibleEmployment.be  This test is not yet approved or cleared by the [place] FDA and has been authorized for detection and/or diagnosis of SARS-CoV-2 by FDA under an Emergency Use Authorization (EUA). This EUA will remain in effect (meaning this test can be used) for the duration of the COVID-19 declaration under Section 564(b)(1) of the Act, 21 U.S.C. section 360bbb-3(b)(1), unless the authorization is terminated or revoked.  Performed at [facility], [address] [place], [place] [address]   Resp Panel by RT-PCR (Flu A&B, Covid) Nasopharyngeal Swab     Status: Abnormal   Collection Time: 09/03/21 12:56 PM   Specimen: Nasopharyngeal Swab; Nasopharyngeal(NP) swabs in vial transport medium  Result Value Ref Range Status   SARS Coronavirus 2 by RT PCR POSITIVE (A) NEGATIVE Final    Comment: RESULT CALLED TO, READ BACK BY AND VERIFIED WITH: EMILY RUEZ 09/03/21  1411 MU (NOTE) SARS-CoV-2 target nucleic acids are DETECTED.  The SARS-CoV-2 RNA is generally detectable in upper respiratory specimens during the acute phase of infection. Positive results are indicative of the presence of the identified virus, but do not rule out bacterial infection or co-infection with other pathogens not detected by the test. Clinical correlation with patient history and other diagnostic information is necessary to determine patient infection status. The expected result is Negative.  Fact Sheet for Patients: EntrepreneurPulse.com.au  Fact Sheet for Healthcare Providers: IncredibleEmployment.be  This test is not yet approved or cleared by the [place] FDA and  has been authorized for detection and/or diagnosis of SARS-CoV-2 by FDA under an Emergency Use Authorization (EUA).  This EUA will remain in effect (  meaning this test can be used)  for the duration of  the COVID-19 declaration under Section 564(b)(1) of the Act, 21 U.S.C. section 360bbb-3(b)(1), unless the authorization is terminated or revoked sooner.     Influenza A by PCR NEGATIVE NEGATIVE Final   Influenza B by PCR NEGATIVE NEGATIVE Final    Comment: (NOTE) The Xpert Xpress SARS-CoV-2/FLU/RSV plus assay is intended as an aid in the diagnosis of influenza from Nasopharyngeal swab specimens and should not be used as a sole basis for treatment. Nasal washings and aspirates are unacceptable for Xpert Xpress SARS-CoV-2/FLU/RSV testing.  Fact Sheet for Patients: EntrepreneurPulse.com.au  Fact Sheet for Healthcare Providers: IncredibleEmployment.be  This test is not yet approved or cleared by the [place] FDA and has been authorized for detection and/or diagnosis of SARS-CoV-2 by FDA under an Emergency Use Authorization (EUA). This EUA will remain in effect (meaning this test can be used) for the duration of  the COVID-19 declaration under Section 564(b)(1) of the Act, 21 U.S.C. section 360bbb-3(b)(1), unless the authorization is terminated or revoked.  Performed at [facility]: [address] [place], [place] [address]   Resp Panel by RT-PCR (Flu A&B, Covid) Nasopharyngeal Swab     Status: Abnormal   Collection Time: 09/03/21  2:22 PM   Specimen: Nasopharyngeal Swab; Nasopharyngeal(NP) swabs in vial transport medium  Result Value Ref Range Status   SARS Coronavirus 2 by RT PCR POSITIVE (A) NEGATIVE Final    Comment: RESULT CALLED TO, READ BACK BY AND VERIFIED WITH: KRISTAN DAVIS 09/03/21 1551 MU  (NOTE) SARS-CoV-2 target nucleic acids are DETECTED.  The SARS-CoV-2 RNA is generally detectable in upper respiratory specimens during the acute phase of infection. Positive results are indicative of the presence of the identified virus, but do not rule out bacterial infection or co-infection with other pathogens not detected by the test. Clinical correlation with patient history and other diagnostic information is necessary to determine patient infection status. The expected result is Negative.  Fact Sheet for Patients: EntrepreneurPulse.com.au  Fact Sheet for Healthcare Providers: IncredibleEmployment.be  This test is not yet approved or cleared by the [place] FDA and  has been authorized for detection and/or diagnosis of SARS-CoV-2 by FDA under an Emergency Use Authorization (EUA).  This EUA will remain in effect (meaning this test can be u sed) for the duration of  the COVID-19 declaration under Section 564(b)(1) of the Act, 21 U.S.C. section 360bbb-3(b)(1), unless the authorization is terminated or revoked sooner.     Influenza A by PCR NEGATIVE NEGATIVE Final   Influenza B by PCR NEGATIVE NEGATIVE Final    Comment: (NOTE) The Xpert Xpress SARS-CoV-2/FLU/RSV plus assay is intended as an aid in the diagnosis of influenza from  Nasopharyngeal swab specimens and should not be used as a sole basis for treatment. Nasal washings and aspirates are unacceptable for Xpert Xpress SARS-CoV-2/FLU/RSV testing.  Fact Sheet for Patients: EntrepreneurPulse.com.au  Fact Sheet for Healthcare Providers: IncredibleEmployment.be  This test is not yet approved or cleared by the [place] FDA and has been authorized for detection and/or diagnosis of SARS-CoV-2 by FDA under an Emergency Use Authorization (EUA). This EUA will remain in effect (meaning this test can be used) for the duration of the COVID-19 declaration under Section 564(b)(1) of the Act, 21 U.S.C. section 360bbb-3(b)(1), unless the authorization is terminated or revoked.  Performed at [facility], [address], [place],  [address]       Radiology Studies: No results found.  Scheduled Meds:  bacitracin   Topical BID   carvedilol  3.125 mg Oral BID WC   cholecalciferol  2,000 Units Oral Daily   enoxaparin (LOVENOX) injection  0.5 mg/kg Subcutaneous Q24H   ferrous sulfate  325 mg Oral Q breakfast   FLUoxetine  20 mg Oral Daily   fluticasone  2 spray Each Nare Daily   furosemide  40 mg Oral Daily   insulin aspart  0-5 Units Subcutaneous QHS   insulin aspart  0-9 Units Subcutaneous TID WC   insulin aspart  6 Units Subcutaneous TID WC   insulin glargine-yfgn  16 Units Subcutaneous Daily   levothyroxine  25 mcg Oral QAC breakfast   lidocaine  1 patch Transdermal Q24H   methocarbamol  750 mg Oral QID   ondansetron (ZOFRAN) IV  4 mg Intravenous Once   polyethylene glycol  17 g Oral BID   pravastatin  40 mg Oral Daily   vitamin B-12  1,000 mcg Oral Daily   Continuous Infusions:   LOS: 6 days     Enzo Bi, MD [facility] If 7PM-7AM, please contact night-coverage 09/04/2021, 4:13 AM

## 2021-09-03 NOTE — Discharge Summary (Incomplete Revision)
Physician Discharge Summary   Gary [name]  male DOB: September 21, 1946  OZD:[id number]  PCP: [name], MD  Admit date: 08/29/2021 Discharge date: 09/03/2021  Admitted From: home Disposition:  SNF CODE STATUS: Full code  Discharge Instructions     Discharge wound care:   Complete by: As directed    Wound care to sutured lacerations on left hand: Cleanse with NS, apt dry. Apply a thin layer of bacitracin ointment. Leave open to air. [facility] Course:  For full details, please see H&P, progress notes, consult notes and ancillary notes.  Briefly,  PLEAS CARNEAL is a 75 y.o. male with medical history significant of type 1 diabetes on insulin pump, hypertension, COPD, hypothyroidism, depression, peripheral neuropathy, gastroparesis, mantle cell lymphoma, hypothyroidism, Graves' disease, CHF, s/p of nephrectomy, CAD, CKD-3A, SSS, s/p of pacemaker placement, iron deficiency anemia, lymphedema, who presented with fall.   Pt is a poor historian.  History was limited.  Per report, patient recently lost his wife.  He lives alone at home.  Patient fell, and injured his left chest wall.  Complained of left rib cage pain.  He stated that he couldn't take deep breaths because of pain.    Of note, patient had fall and was seen in ED on 11/14. He had skin laceration in left hand which was sutured up. He had unremarkable CTs of his head, face and cervical spine that time.   Patient was found to have hypoglycemia with blood sugar 19 on presentation.  Insulin pump discontinued.  Pt transitioned to subQ insulin.  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  Insulin pump d/c'ed.   Diabetes coordinator consulted.   Recent A1c 6.9.   Based on the usage during hospitalization, pt is discharged on long-acting 16u daily, mealtime 6u TID and SSI ACHS.   --patient to follow up with Dr. Honor Junes to discuss  resuming insulin pump if appropriate after he is discharged from rehab facility.   Chest pain 2/2  Left rib fracture_7th rib Chest pain has resolved.   Trop neg. -Incentive spirometry   Hypercholesterolemia -Pravastatin   Fall-recurrent -PT/OT, recommend SNF   Iron deficiency anemia  Hemoglobin stable, 13.7 -Continue iron supplement   CKD (chronic kidney disease), stage IIIa Stable.  Baseline creatinine 2.5-9.5   Chronic systolic CHF (congestive heart failure) (HCC)  BNP 198, no respiratory distress.   Not acutely exacerbated -Continue Lasix as 40 mg daily -Continue Coreg, with holding parameter for systolic <638 or HR <75.   Hypokalemia -Monitored and replaced as necessary   Leukocytosis No source of infection identified.   No fever.  Urinalysis negative.  Likely reactive   HTN (hypertension) BP mostly low normal.   -Continue Lasix as 40 mg daily -Continue Coreg, with holding parameter for systolic <643 or HR <32.   Hypothyroid -Continue Synthroid   CAD (coronary artery disease): -Continue pravastatin   Skin laceration in left hand: -Wound care consult, wound care per order as above.   Depression Complicated grief Patient lost his wife of 50+  years 1 year ago Per patients sister, he has been very depressed over the past year Was previously on Prozac, which was restarted during this hospitalization.   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 The score is based on dignosis, age, lab data, medications, orders, and past utilization.   Low:  0-14.9   Medium: 15-21.9   High: 22-29.9   Extreme: 30 and above         Discharge Instructions:  Allergies as of 09/03/2021       Reactions   Rofecoxib Nausea Only        Medication List     STOP taking these medications    insulin aspart 100 UNIT/ML injection Commonly known as: NovoLOG Replaced by: insulin aspart 100 UNIT/ML injection   nystatin cream Commonly known as: MYCOSTATIN   potassium chloride 10 MEQ tablet Commonly known as: KLOR-CON       TAKE these medications    carvedilol 3.125 MG tablet Commonly known as: COREG Take 1 tablet (3.125 mg total) by mouth 2 (two) times daily with a meal. Hold if systolic <765 and HR <46 What changed: additional instructions   ferrous sulfate 325 (65 FE) MG tablet Take 325 mg by mouth daily with breakfast.   FLUoxetine 20 MG capsule Commonly known as: PROZAC Take 1 capsule (20 mg total) by mouth daily. Start taking on: September 04, 2021   furosemide 40 MG tablet Commonly known as: LASIX Take 1 tablet (40 mg total) by mouth daily. What changed:  how much to take when to take this additional instructions   glucose 4 GM chewable tablet Chew 1 tablet (4 g total) by mouth once as needed for low blood sugar.   insulin aspart 100 UNIT/ML injection Commonly known as: novoLOG Inject 0-9 Units into the skin 3 (three) times daily with meals. CBG 70 - 120: 0 units CBG 121 - 150: 1 unit CBG 151 - 200: 2 units CBG 201 - 250: 3 units CBG 251 - 300: 5 units CBG 301 - 350: 7 units CBG 351 - 400: 9  units Replaces: insulin aspart 100 UNIT/ML injection   insulin aspart 100 UNIT/ML injection Commonly known as: novoLOG Inject 0-5 Units into the skin at bedtime. CBG 70 - 120: 0 units CBG 121 - 150: 0 units CBG 151 - 200: 0 units CBG 201 - 250: 2 units CBG 251 - 300: 3 units CBG 301 - 350: 4 units CBG 351 - 400: 5 units   insulin aspart 100 UNIT/ML injection Commonly known as: novoLOG Inject 6 Units into the skin 3 (three) times daily with meals.   insulin glargine-yfgn 100 UNIT/ML injection Commonly known as: SEMGLEE Inject 0.16 mLs (16 Units total) into the skin daily. Start taking on: September 04, 2021   levothyroxine 25 MCG tablet Commonly known as: SYNTHROID Take 25 mcg by mouth daily before breakfast.   lidocaine 5 % Commonly known as: LIDODERM Place 1 patch onto the  skin daily. Remove & Discard patch within 12 hours or as directed by MD Start taking on: September 04, 2021   methocarbamol 750 MG tablet Commonly known as: ROBAXIN Take 1 tablet (750 mg total) by mouth every 6 (six) hours as needed for muscle spasms.   polyethylene glycol 17 g packet Commonly known as: MIRALAX / GLYCOLAX Take 17 g by mouth 2 (two) times daily.   polyvinyl alcohol 1.4 % ophthalmic solution Commonly known as: LIQUIFILM TEARS Place 1 drop into both eyes as needed for dry eyes.   pravastatin 40 MG tablet Commonly known as: PRAVACHOL Take 40 mg by mouth daily.   vitamin B-12 1000 MCG tablet Commonly known as: CYANOCOBALAMIN Take 1,000 mcg by mouth daily.   vitamin D3 50 MCG (2000 UT) Caps Take 2,000 Units by mouth daily.               Discharge Care Instructions  (From admission, onward)           Start     Ordered   09/03/21 0000  Discharge wound care:       Comments: Wound care to sutured lacerations on left hand: Cleanse with NS, apt dry. Apply a thin layer of bacitracin ointment. Leave open to air. - -   09/03/21 1313             Contact information for  follow-up providers     [name], MD Follow up.   Specialty: Internal Medicine Contact information: [address] [place] [address] [phone]              Contact information for after-discharge care     Destination     [facility] Preferred SNF .   Service: Skilled Nursing Contact information: [place] [phone]                     Allergies  Allergen Reactions   Rofecoxib Nausea Only     The results of significant diagnostics from this hospitalization (including imaging, microbiology, ancillary and laboratory) are listed below for reference.   Consultations:   Procedures/Studies: DG Chest 1 View  Result Date: 08/25/2021 CLINICAL DATA:  Lost balance leading to fall on concrete Dr rule out pneumothorax. EXAM: CHEST  1 VIEW COMPARISON:  Chest and left rib series earlier today. Chest CT 08/01/2021 FINDINGS: Right chest port tip in the SVC. Dual lead left-sided pacemaker in place. Unchanged heart size and mediastinal contours with stable mild cardiomegaly. No pneumothorax. No acute chest findings or interval change from earlier today. Emphysema with areas of scarring. Right proximal humerus deformity is chronic. IMPRESSION: No pneumothorax or interval change from earlier today. Electronically Signed   By: Keith Rake M.D.   On: 08/25/2021 15:34   DG Ribs Unilateral W/Chest Left  Result Date: 08/25/2021 CLINICAL DATA:  Fall EXAM: LEFT RIBS AND CHEST - 3+ VIEW COMPARISON:  07/14/2018 FINDINGS: No fracture or other bone lesions are seen involving the ribs. There is no evidence of pneumothorax or pleural effusion. Both lungs are clear. Cardiomegaly with left chest multi lead pacer. Right chest port catheter. IMPRESSION: 1. No displaced rib fracture or other radiographic abnormality of the ribs. 2. Cardiomegaly. Electronically Signed   By: Delanna Ahmadi M.D.   On:  08/25/2021 13:58   DG Knee 2 Views Left  Result Date: 08/25/2021 CLINICAL DATA:  Lost balance leading to fall on concrete driveway. EXAM: LEFT KNEE -  1-2 VIEW COMPARISON:  None. FINDINGS: No evidence of fracture, dislocation, or joint effusion. Minor degenerative change with peripheral spurring and spurring of the tibial spines. The alignment is normal. Joint spaces are preserved. Diffuse generalized soft tissue edema. IMPRESSION: Generalized soft tissue edema. No acute osseous abnormality. Electronically Signed   By: Keith Rake M.D.   On: 08/25/2021 15:37   CT HEAD WO CONTRAST (5MM)  Result Date: 08/29/2021 CLINICAL DATA:  Altered mental status, Unwitnessed fall, left rib pain EXAM: CT HEAD WITHOUT CONTRAST CT MAXILLOFACIAL WITHOUT CONTRAST CT CERVICAL SPINE WITHOUT CONTRAST CT CHEST, ABDOMEN AND PELVIS WITH CONTRAST TECHNIQUE: Contiguous axial images were obtained from the base of the skull through the vertex without intravenous contrast. Multidetector CT imaging of the maxillofacial structures was performed. Multiplanar CT image reconstructions were also generated. A small metallic BB was placed on the right temple in order to reliably differentiate right from left. Multidetector CT imaging of the cervical spine was performed without intravenous contrast. Multiplanar CT image reconstructions were also generated. Multidetector CT imaging of the chest, abdomen and pelvis was performed following the standard protocol during bolus administration of intravenous contrast. CONTRAST:  163mL OMNIPAQUE IOHEXOL 300 MG/ML  SOLN COMPARISON:  08/25/2021, 07/29/2020 FINDINGS: CT HEAD FINDINGS Brain: Normal anatomic configuration. Parenchymal volume loss is commensurate with the patient's age. Mild periventricular white matter changes are present likely reflecting the sequela of small vessel ischemia. No abnormal intra or extra-axial mass lesion or fluid collection. No abnormal mass effect or midline shift. No  evidence of acute intracranial hemorrhage or infarct. Ventricular size is normal. Cerebellum unremarkable. Vascular: No asymmetric hyperdense vasculature at the skull base. Skull: Intact Other: Mastoid air cells and middle ear cavities are clear. Small left frontal scalp hematoma noted. CT MAXILLOFACIAL FINDINGS Osseous: No fracture or mandibular dislocation. No destructive process. Orbits: Ocular lenses have been removed. The orbits are otherwise unremarkable. Sinuses: The paranasal sinuses are clear. Soft tissues: Mild left preseptal and infraorbital soft tissue swelling. CT CERVICAL SPINE FINDINGS Alignment: 2 mm anterolisthesis of C3 upon C4 is unchanged. Skull base and vertebrae: Craniocervical alignment is normal. Atlantodental interval is not widened. No acute fracture the cervical spine. Vertebral body height has been preserved. Soft tissues and spinal canal: No prevertebral fluid or swelling. No visible canal hematoma. Disc levels: Intervertebral disc space narrowing and endplate remodeling at R4-4 and C6-7 is in keeping with changes of moderate degenerative disc disease. Minimal degenerative changes are seen throughout the remainder of the cervical spine. The prevertebral soft tissues are not thickened on sagittal reformats. Mild central canal stenosis at C3-4 secondary to anterolisthesis and associated posterior disc osteophyte complex. Review of the axial images demonstrates multilevel uncovertebral and facet arthrosis resulting in moderate left and mild right neuroforaminal narrowing at C3-4 and C4-5. Other: None CT CHEST FINDINGS Cardiovascular: Moderate coronary artery calcification. Global cardiac size within normal limits. No pericardial effusion. Central pulmonary arteries are of normal caliber. The thoracic aorta is unremarkable. Left subclavian dual lead pacemaker is in place with leads within the right atrium and right ventricle. Left internal jugular chest port tip is seen within the superior  vena cava. Mediastinum/Nodes: No enlarged mediastinal, hilar, or axillary lymph nodes. Thyroid gland, trachea, and esophagus demonstrate no significant findings. Lungs/Pleura: Mild basilar predominant pulmonary fibrotic change. No focal pulmonary infiltrate. No pneumothorax or pleural effusion. No central obstructing mass. Musculoskeletal: An acute minimally displaced fracture of the left seventh rib is seen laterally. Multiple healed bilateral rib fractures are noted. CT ABDOMEN  AND PELVIS FINDINGS Hepatobiliary: No focal liver abnormality is seen. Status post cholecystectomy. No biliary dilatation. Pancreas: Unremarkable Spleen: Unremarkable Adrenals/Urinary Tract: Adrenal glands are unremarkable. Status post right nephrectomy. Left kidney is normal in size and position. Stable probable hyperdense renal cyst arising from the lower pole of the left kidney. Left kidney is otherwise unremarkable. Bladder unremarkable. Stomach/Bowel: The stomach, small bowel, and large bowel are unremarkable. No free intraperitoneal gas or fluid. Vascular/Lymphatic: Aortic atherosclerosis. No enlarged abdominal or pelvic lymph nodes. Reproductive: Mild prostatic enlargement. Other: Tiny left fat containing inguinal hernia Musculoskeletal: No acute bone abnormality within the abdomen and pelvis. Osseous structures are age-appropriate. IMPRESSION: No acute intracranial injury. No calvarial fracture. Small left frontal scalp hematoma. No acute facial fracture. Mild left preseptal and infraorbital soft tissue swelling. No acute fracture or listhesis of the cervical spine. Acute left seventh rib fracture.  No pneumothorax. No acute intra-abdominal pathology. Additional incidental findings as noted above. Aortic Atherosclerosis (ICD10-I70.0).  Fall Electronically Signed   By: Fidela Salisbury M.D.   On: 08/29/2021 02:56   CT HEAD WO CONTRAST (5MM)  Result Date: 08/25/2021 CLINICAL DATA:  Trauma, fall EXAM: CT HEAD WITHOUT CONTRAST  TECHNIQUE: Contiguous axial images were obtained from the base of the skull through the vertex without intravenous contrast. COMPARISON:  None. FINDINGS: Brain: There are no signs of bleeding within the cranium. Ventricles are not dilated. There is no shift of midline structures. Cortical sulci are prominent. Vascular: Unremarkable. Skull: There is subcutaneous contusion/hematoma in the left periorbital region and in the anterior frontal scalp. No definite fracture is seen in the calvarium. Sinuses/Orbits: There are no air-fluid levels or significant mucosal thickening. Other: None IMPRESSION: No acute intracranial findings are seen in noncontrast CT brain. There are no signs of bleeding within the cranium. There is subcutaneous contusion/hematoma in the frontal scalp and left periorbital region. Electronically Signed   By: Elmer Picker M.D.   On: 08/25/2021 13:56   CT Cervical Spine Wo Contrast  Result Date: 08/29/2021 CLINICAL DATA:  Altered mental status, Unwitnessed fall, left rib pain EXAM: CT HEAD WITHOUT CONTRAST CT MAXILLOFACIAL WITHOUT CONTRAST CT CERVICAL SPINE WITHOUT CONTRAST CT CHEST, ABDOMEN AND PELVIS WITH CONTRAST TECHNIQUE: Contiguous axial images were obtained from the base of the skull through the vertex without intravenous contrast. Multidetector CT imaging of the maxillofacial structures was performed. Multiplanar CT image reconstructions were also generated. A small metallic BB was placed on the right temple in order to reliably differentiate right from left. Multidetector CT imaging of the cervical spine was performed without intravenous contrast. Multiplanar CT image reconstructions were also generated. Multidetector CT imaging of the chest, abdomen and pelvis was performed following the standard protocol during bolus administration of intravenous contrast. CONTRAST:  122mL OMNIPAQUE IOHEXOL 300 MG/ML  SOLN COMPARISON:  08/25/2021, 07/29/2020 FINDINGS: CT HEAD FINDINGS Brain:  Normal anatomic configuration. Parenchymal volume loss is commensurate with the patient's age. Mild periventricular white matter changes are present likely reflecting the sequela of small vessel ischemia. No abnormal intra or extra-axial mass lesion or fluid collection. No abnormal mass effect or midline shift. No evidence of acute intracranial hemorrhage or infarct. Ventricular size is normal. Cerebellum unremarkable. Vascular: No asymmetric hyperdense vasculature at the skull base. Skull: Intact Other: Mastoid air cells and middle ear cavities are clear. Small left frontal scalp hematoma noted. CT MAXILLOFACIAL FINDINGS Osseous: No fracture or mandibular dislocation. No destructive process. Orbits: Ocular lenses have been removed. The orbits are otherwise unremarkable. Sinuses: The paranasal sinuses  are clear. Soft tissues: Mild left preseptal and infraorbital soft tissue swelling. CT CERVICAL SPINE FINDINGS Alignment: 2 mm anterolisthesis of C3 upon C4 is unchanged. Skull base and vertebrae: Craniocervical alignment is normal. Atlantodental interval is not widened. No acute fracture the cervical spine. Vertebral body height has been preserved. Soft tissues and spinal canal: No prevertebral fluid or swelling. No visible canal hematoma. Disc levels: Intervertebral disc space narrowing and endplate remodeling at L8-7 and C6-7 is in keeping with changes of moderate degenerative disc disease. Minimal degenerative changes are seen throughout the remainder of the cervical spine. The prevertebral soft tissues are not thickened on sagittal reformats. Mild central canal stenosis at C3-4 secondary to anterolisthesis and associated posterior disc osteophyte complex. Review of the axial images demonstrates multilevel uncovertebral and facet arthrosis resulting in moderate left and mild right neuroforaminal narrowing at C3-4 and C4-5. Other: None CT CHEST FINDINGS Cardiovascular: Moderate coronary artery calcification. Global  cardiac size within normal limits. No pericardial effusion. Central pulmonary arteries are of normal caliber. The thoracic aorta is unremarkable. Left subclavian dual lead pacemaker is in place with leads within the right atrium and right ventricle. Left internal jugular chest port tip is seen within the superior vena cava. Mediastinum/Nodes: No enlarged mediastinal, hilar, or axillary lymph nodes. Thyroid gland, trachea, and esophagus demonstrate no significant findings. Lungs/Pleura: Mild basilar predominant pulmonary fibrotic change. No focal pulmonary infiltrate. No pneumothorax or pleural effusion. No central obstructing mass. Musculoskeletal: An acute minimally displaced fracture of the left seventh rib is seen laterally. Multiple healed bilateral rib fractures are noted. CT ABDOMEN AND PELVIS FINDINGS Hepatobiliary: No focal liver abnormality is seen. Status post cholecystectomy. No biliary dilatation. Pancreas: Unremarkable Spleen: Unremarkable Adrenals/Urinary Tract: Adrenal glands are unremarkable. Status post right nephrectomy. Left kidney is normal in size and position. Stable probable hyperdense renal cyst arising from the lower pole of the left kidney. Left kidney is otherwise unremarkable. Bladder unremarkable. Stomach/Bowel: The stomach, small bowel, and large bowel are unremarkable. No free intraperitoneal gas or fluid. Vascular/Lymphatic: Aortic atherosclerosis. No enlarged abdominal or pelvic lymph nodes. Reproductive: Mild prostatic enlargement. Other: Tiny left fat containing inguinal hernia Musculoskeletal: No acute bone abnormality within the abdomen and pelvis. Osseous structures are age-appropriate. IMPRESSION: No acute intracranial injury. No calvarial fracture. Small left frontal scalp hematoma. No acute facial fracture. Mild left preseptal and infraorbital soft tissue swelling. No acute fracture or listhesis of the cervical spine. Acute left seventh rib fracture.  No pneumothorax. No  acute intra-abdominal pathology. Additional incidental findings as noted above. Aortic Atherosclerosis (ICD10-I70.0).  Fall Electronically Signed   By: Fidela Salisbury M.D.   On: 08/29/2021 02:56   CT Cervical Spine Wo Contrast  Result Date: 08/25/2021 CLINICAL DATA:  Fall with trauma to the face and forehead. EXAM: CT CERVICAL SPINE WITHOUT CONTRAST TECHNIQUE: Multidetector CT imaging of the cervical spine was performed without intravenous contrast. Multiplanar CT image reconstructions were also generated. COMPARISON:  10/28/2017 FINDINGS: Alignment: 2 mm degenerative anterolisthesis C3-4 and C4-5. Skull base and vertebrae: No regional fracture. Soft tissues and spinal canal: No sign of soft tissue injury. Disc levels: Foramen magnum widely patent. Ordinary osteoarthritis at the C1-2 articulation. C2-3: Bilateral facet arthropathy, new since the prior study. C3-4: Bilateral facet arthropathy, worsened since the previous study. 2 mm of anterolisthesis. Degenerative spondylosis. Bilateral foraminal stenosis. C4-5: Bilateral facet arthropathy, worsened since the previous study. 2 mm of anterolisthesis. Degenerative spondylosis. Bilateral foraminal stenosis. C5-6: Facet osteoarthritis on the right.  No stenosis. C6-7: Mild  spondylosis.  No stenosis. C7-T1: Normal. Upper chest: Negative Other: None IMPRESSION: No acute traumatic finding. Since the study of 2019, the patient has developed facet arthropathy in the upper cervical spine as outlined above. Foraminal stenosis at C3-4 and C4-5 could be symptomatic. Electronically Signed   By: Nelson Chimes M.D.   On: 08/25/2021 13:58   CT CHEST ABDOMEN PELVIS W CONTRAST  Result Date: 08/29/2021 CLINICAL DATA:  Altered mental status, Unwitnessed fall, left rib pain EXAM: CT HEAD WITHOUT CONTRAST CT MAXILLOFACIAL WITHOUT CONTRAST CT CERVICAL SPINE WITHOUT CONTRAST CT CHEST, ABDOMEN AND PELVIS WITH CONTRAST TECHNIQUE: Contiguous axial images were obtained from the base of  the skull through the vertex without intravenous contrast. Multidetector CT imaging of the maxillofacial structures was performed. Multiplanar CT image reconstructions were also generated. A small metallic BB was placed on the right temple in order to reliably differentiate right from left. Multidetector CT imaging of the cervical spine was performed without intravenous contrast. Multiplanar CT image reconstructions were also generated. Multidetector CT imaging of the chest, abdomen and pelvis was performed following the standard protocol during bolus administration of intravenous contrast. CONTRAST:  151mL OMNIPAQUE IOHEXOL 300 MG/ML  SOLN COMPARISON:  08/25/2021, 07/29/2020 FINDINGS: CT HEAD FINDINGS Brain: Normal anatomic configuration. Parenchymal volume loss is commensurate with the patient's age. Mild periventricular white matter changes are present likely reflecting the sequela of small vessel ischemia. No abnormal intra or extra-axial mass lesion or fluid collection. No abnormal mass effect or midline shift. No evidence of acute intracranial hemorrhage or infarct. Ventricular size is normal. Cerebellum unremarkable. Vascular: No asymmetric hyperdense vasculature at the skull base. Skull: Intact Other: Mastoid air cells and middle ear cavities are clear. Small left frontal scalp hematoma noted. CT MAXILLOFACIAL FINDINGS Osseous: No fracture or mandibular dislocation. No destructive process. Orbits: Ocular lenses have been removed. The orbits are otherwise unremarkable. Sinuses: The paranasal sinuses are clear. Soft tissues: Mild left preseptal and infraorbital soft tissue swelling. CT CERVICAL SPINE FINDINGS Alignment: 2 mm anterolisthesis of C3 upon C4 is unchanged. Skull base and vertebrae: Craniocervical alignment is normal. Atlantodental interval is not widened. No acute fracture the cervical spine. Vertebral body height has been preserved. Soft tissues and spinal canal: No prevertebral fluid or swelling.  No visible canal hematoma. Disc levels: Intervertebral disc space narrowing and endplate remodeling at U2-3 and C6-7 is in keeping with changes of moderate degenerative disc disease. Minimal degenerative changes are seen throughout the remainder of the cervical spine. The prevertebral soft tissues are not thickened on sagittal reformats. Mild central canal stenosis at C3-4 secondary to anterolisthesis and associated posterior disc osteophyte complex. Review of the axial images demonstrates multilevel uncovertebral and facet arthrosis resulting in moderate left and mild right neuroforaminal narrowing at C3-4 and C4-5. Other: None CT CHEST FINDINGS Cardiovascular: Moderate coronary artery calcification. Global cardiac size within normal limits. No pericardial effusion. Central pulmonary arteries are of normal caliber. The thoracic aorta is unremarkable. Left subclavian dual lead pacemaker is in place with leads within the right atrium and right ventricle. Left internal jugular chest port tip is seen within the superior vena cava. Mediastinum/Nodes: No enlarged mediastinal, hilar, or axillary lymph nodes. Thyroid gland, trachea, and esophagus demonstrate no significant findings. Lungs/Pleura: Mild basilar predominant pulmonary fibrotic change. No focal pulmonary infiltrate. No pneumothorax or pleural effusion. No central obstructing mass. Musculoskeletal: An acute minimally displaced fracture of the left seventh rib is seen laterally. Multiple healed bilateral rib fractures are noted. CT ABDOMEN AND PELVIS FINDINGS  Hepatobiliary: No focal liver abnormality is seen. Status post cholecystectomy. No biliary dilatation. Pancreas: Unremarkable Spleen: Unremarkable Adrenals/Urinary Tract: Adrenal glands are unremarkable. Status post right nephrectomy. Left kidney is normal in size and position. Stable probable hyperdense renal cyst arising from the lower pole of the left kidney. Left kidney is otherwise unremarkable. Bladder  unremarkable. Stomach/Bowel: The stomach, small bowel, and large bowel are unremarkable. No free intraperitoneal gas or fluid. Vascular/Lymphatic: Aortic atherosclerosis. No enlarged abdominal or pelvic lymph nodes. Reproductive: Mild prostatic enlargement. Other: Tiny left fat containing inguinal hernia Musculoskeletal: No acute bone abnormality within the abdomen and pelvis. Osseous structures are age-appropriate. IMPRESSION: No acute intracranial injury. No calvarial fracture. Small left frontal scalp hematoma. No acute facial fracture. Mild left preseptal and infraorbital soft tissue swelling. No acute fracture or listhesis of the cervical spine. Acute left seventh rib fracture.  No pneumothorax. No acute intra-abdominal pathology. Additional incidental findings as noted above. Aortic Atherosclerosis (ICD10-I70.0).  Fall Electronically Signed   By: Fidela Salisbury M.D.   On: 08/29/2021 02:56   DG Hand Complete Left  Result Date: 08/25/2021 CLINICAL DATA:  Lost balance leading to fall on concrete try fight. Left hand pain and bruising. EXAM: LEFT HAND - COMPLETE 3+ VIEW COMPARISON:  None. FINDINGS: There is no evidence of fracture or dislocation. Moderate osteoarthritis at the thumb carpal metacarpal joint. Lesser osteoarthritis of the distal interphalangeal joints of the digits. No erosion or periosteal reaction. Mild soft tissue edema over the dorsum of the metacarpals. IMPRESSION: 1. No acute fracture or subluxation. 2. Osteoarthritis, most prominent at the thumb carpometacarpal joint. Electronically Signed   By: Keith Rake M.D.   On: 08/25/2021 15:36   CT Maxillofacial Wo Contrast  Result Date: 08/29/2021 CLINICAL DATA:  Altered mental status, Unwitnessed fall, left rib pain EXAM: CT HEAD WITHOUT CONTRAST CT MAXILLOFACIAL WITHOUT CONTRAST CT CERVICAL SPINE WITHOUT CONTRAST CT CHEST, ABDOMEN AND PELVIS WITH CONTRAST TECHNIQUE: Contiguous axial images were obtained from the base of the skull  through the vertex without intravenous contrast. Multidetector CT imaging of the maxillofacial structures was performed. Multiplanar CT image reconstructions were also generated. A small metallic BB was placed on the right temple in order to reliably differentiate right from left. Multidetector CT imaging of the cervical spine was performed without intravenous contrast. Multiplanar CT image reconstructions were also generated. Multidetector CT imaging of the chest, abdomen and pelvis was performed following the standard protocol during bolus administration of intravenous contrast. CONTRAST:  197mL OMNIPAQUE IOHEXOL 300 MG/ML  SOLN COMPARISON:  08/25/2021, 07/29/2020 FINDINGS: CT HEAD FINDINGS Brain: Normal anatomic configuration. Parenchymal volume loss is commensurate with the patient's age. Mild periventricular white matter changes are present likely reflecting the sequela of small vessel ischemia. No abnormal intra or extra-axial mass lesion or fluid collection. No abnormal mass effect or midline shift. No evidence of acute intracranial hemorrhage or infarct. Ventricular size is normal. Cerebellum unremarkable. Vascular: No asymmetric hyperdense vasculature at the skull base. Skull: Intact Other: Mastoid air cells and middle ear cavities are clear. Small left frontal scalp hematoma noted. CT MAXILLOFACIAL FINDINGS Osseous: No fracture or mandibular dislocation. No destructive process. Orbits: Ocular lenses have been removed. The orbits are otherwise unremarkable. Sinuses: The paranasal sinuses are clear. Soft tissues: Mild left preseptal and infraorbital soft tissue swelling. CT CERVICAL SPINE FINDINGS Alignment: 2 mm anterolisthesis of C3 upon C4 is unchanged. Skull base and vertebrae: Craniocervical alignment is normal. Atlantodental interval is not widened. No acute fracture the cervical spine. Vertebral body  height has been preserved. Soft tissues and spinal canal: No prevertebral fluid or swelling. No  visible canal hematoma. Disc levels: Intervertebral disc space narrowing and endplate remodeling at B3-5 and C6-7 is in keeping with changes of moderate degenerative disc disease. Minimal degenerative changes are seen throughout the remainder of the cervical spine. The prevertebral soft tissues are not thickened on sagittal reformats. Mild central canal stenosis at C3-4 secondary to anterolisthesis and associated posterior disc osteophyte complex. Review of the axial images demonstrates multilevel uncovertebral and facet arthrosis resulting in moderate left and mild right neuroforaminal narrowing at C3-4 and C4-5. Other: None CT CHEST FINDINGS Cardiovascular: Moderate coronary artery calcification. Global cardiac size within normal limits. No pericardial effusion. Central pulmonary arteries are of normal caliber. The thoracic aorta is unremarkable. Left subclavian dual lead pacemaker is in place with leads within the right atrium and right ventricle. Left internal jugular chest port tip is seen within the superior vena cava. Mediastinum/Nodes: No enlarged mediastinal, hilar, or axillary lymph nodes. Thyroid gland, trachea, and esophagus demonstrate no significant findings. Lungs/Pleura: Mild basilar predominant pulmonary fibrotic change. No focal pulmonary infiltrate. No pneumothorax or pleural effusion. No central obstructing mass. Musculoskeletal: An acute minimally displaced fracture of the left seventh rib is seen laterally. Multiple healed bilateral rib fractures are noted. CT ABDOMEN AND PELVIS FINDINGS Hepatobiliary: No focal liver abnormality is seen. Status post cholecystectomy. No biliary dilatation. Pancreas: Unremarkable Spleen: Unremarkable Adrenals/Urinary Tract: Adrenal glands are unremarkable. Status post right nephrectomy. Left kidney is normal in size and position. Stable probable hyperdense renal cyst arising from the lower pole of the left kidney. Left kidney is otherwise unremarkable. Bladder  unremarkable. Stomach/Bowel: The stomach, small bowel, and large bowel are unremarkable. No free intraperitoneal gas or fluid. Vascular/Lymphatic: Aortic atherosclerosis. No enlarged abdominal or pelvic lymph nodes. Reproductive: Mild prostatic enlargement. Other: Tiny left fat containing inguinal hernia Musculoskeletal: No acute bone abnormality within the abdomen and pelvis. Osseous structures are age-appropriate. IMPRESSION: No acute intracranial injury. No calvarial fracture. Small left frontal scalp hematoma. No acute facial fracture. Mild left preseptal and infraorbital soft tissue swelling. No acute fracture or listhesis of the cervical spine. Acute left seventh rib fracture.  No pneumothorax. No acute intra-abdominal pathology. Additional incidental findings as noted above. Aortic Atherosclerosis (ICD10-I70.0).  Fall Electronically Signed   By: Fidela Salisbury M.D.   On: 08/29/2021 02:56   CT Maxillofacial Wo Contrast  Result Date: 08/25/2021 CLINICAL DATA:  Fall after losing balance. EXAM: CT MAXILLOFACIAL WITHOUT CONTRAST TECHNIQUE: Multidetector CT imaging of the maxillofacial structures was performed. Multiplanar CT image reconstructions were also generated. COMPARISON:  None. FINDINGS: Osseous: No facial fracture. Orbits: No intraorbital injury. Periorbital hematoma superficially, left more than right. Sinuses: Clear.  No traumatic fluid. Soft tissues: Superficial hematoma in the periorbital regions left more than right and the forehead. Limited intracranial: Negative IMPRESSION: No facial fracture.  Superficial hematoma as above. Electronically Signed   By: Nelson Chimes M.D.   On: 08/25/2021 13:55      Labs: BNP (last 3 results) Recent Labs    08/29/21 0020  BNP 329.9*   Basic Metabolic Panel: Recent Labs  Lab 08/29/21 0020 08/30/21 0514 09/03/21 1222  NA 145 138 131*  K 3.1* 3.8 4.8  CL 107 108 98  CO2 25 20* 25  GLUCOSE 43* 178* 278*  BUN 28* 38* 36*  CREATININE 1.46* 1.59*  1.39*  CALCIUM 8.5* 7.7* 8.1*  MG 2.0  --  2.0   Liver Function Tests:  Recent Labs  Lab 08/29/21 0020  AST 17  ALT 7  ALKPHOS 108  BILITOT 0.5  PROT 7.2  ALBUMIN 3.7   No results for input(s): LIPASE, AMYLASE in the last 168 hours. Recent Labs  Lab 08/29/21 0020  AMMONIA 19   CBC: Recent Labs  Lab 08/29/21 0020 08/30/21 0514 09/03/21 1222  WBC 12.2* 6.4 3.5*  HGB 13.7 11.7* 13.3  HCT 41.7 35.0* 39.3  MCV 93.3 93.8 91.2  PLT 170 123* 94*   Cardiac Enzymes: No results for input(s): CKTOTAL, CKMB, CKMBINDEX, TROPONINI in the last 168 hours. BNP: Invalid input(s): POCBNP CBG: Recent Labs  Lab 09/02/21 1141 09/02/21 1622 09/02/21 2122 09/03/21 0758 09/03/21 1201  GLUCAP 170* 170* 189* 253* 284*   D-Dimer No results for input(s): DDIMER in the last 72 hours. Hgb A1c No results for input(s): HGBA1C in the last 72 hours. Lipid Profile No results for input(s): CHOL, HDL, LDLCALC, TRIG, CHOLHDL, LDLDIRECT in the last 72 hours. Thyroid function studies No results for input(s): TSH, T4TOTAL, T3FREE, THYROIDAB in the last 72 hours.  Invalid input(s): FREET3 Anemia work up No results for input(s): VITAMINB12, FOLATE, FERRITIN, TIBC, IRON, RETICCTPCT in the last 72 hours. Urinalysis    Component Value Date/Time   COLORURINE YELLOW (A) 08/29/2021 0221   APPEARANCEUR CLEAR (A) 08/29/2021 0221   APPEARANCEUR Cloudy 06/13/2014 0823   LABSPEC 1.019 08/29/2021 0221   LABSPEC 1.018 06/13/2014 0823   PHURINE 5.0 08/29/2021 0221   GLUCOSEU NEGATIVE 08/29/2021 0221   GLUCOSEU 50 mg/dL 06/13/2014 0823   HGBUR SMALL (A) 08/29/2021 0221   BILIRUBINUR NEGATIVE 08/29/2021 0221   BILIRUBINUR Negative 06/13/2014 0823   KETONESUR 5 (A) 08/29/2021 0221   PROTEINUR NEGATIVE 08/29/2021 0221   NITRITE NEGATIVE 08/29/2021 0221   LEUKOCYTESUR NEGATIVE 08/29/2021 0221   LEUKOCYTESUR 2+ 06/13/2014 0823   Sepsis Labs Invalid input(s): PROCALCITONIN,  WBC,   LACTICIDVEN Microbiology Recent Results (from the past 240 hour(s))  Resp Panel by RT-PCR (Flu A&B, Covid) Nasopharyngeal Swab     Status: None   Collection Time: 08/29/21 11:04 AM   Specimen: Nasopharyngeal Swab; Nasopharyngeal(NP) swabs in vial transport medium  Result Value Ref Range Status   SARS Coronavirus 2 by RT PCR NEGATIVE NEGATIVE Final    Comment: (NOTE) SARS-CoV-2 target nucleic acids are NOT DETECTED.  The SARS-CoV-2 RNA is generally detectable in upper respiratory specimens during the acute phase of infection. The lowest concentration of SARS-CoV-2 viral copies this assay can detect is 138 copies/mL. A negative result does not preclude SARS-Cov-2 infection and should not be used as the sole basis for treatment or other patient management decisions. A negative result may occur with  improper specimen collection/handling, submission of specimen other than nasopharyngeal swab, presence of viral mutation(s) within the areas targeted by this assay, and inadequate number of viral copies(<138 copies/mL). A negative result must be combined with clinical observations, patient history, and epidemiological information. The expected result is Negative.  Fact Sheet for Patients:  EntrepreneurPulse.com.au  Fact Sheet for Healthcare Providers:  IncredibleEmployment.be  This test is no t yet approved or cleared by the [place] FDA and  has been authorized for detection and/or diagnosis of SARS-CoV-2 by FDA under an Emergency Use Authorization (EUA). This EUA will remain  in effect (meaning this test can be used) for the duration of the COVID-19 declaration under Section 564(b)(1) of the Act, 21 U.S.C.section 360bbb-3(b)(1), unless the authorization is terminated  or revoked sooner.  Influenza A by PCR NEGATIVE NEGATIVE Final   Influenza B by PCR NEGATIVE NEGATIVE Final    Comment: (NOTE) The Xpert Xpress SARS-CoV-2/FLU/RSV plus  assay is intended as an aid in the diagnosis of influenza from Nasopharyngeal swab specimens and should not be used as a sole basis for treatment. Nasal washings and aspirates are unacceptable for Xpert Xpress SARS-CoV-2/FLU/RSV testing.  Fact Sheet for Patients: EntrepreneurPulse.com.au  Fact Sheet for Healthcare Providers: IncredibleEmployment.be  This test is not yet approved or cleared by the [place] FDA and has been authorized for detection and/or diagnosis of SARS-CoV-2 by FDA under an Emergency Use Authorization (EUA). This EUA will remain in effect (meaning this test can be used) for the duration of the COVID-19 declaration under Section 564(b)(1) of the Act, 21 U.S.C. section 360bbb-3(b)(1), unless the authorization is terminated or revoked.  Performed at [facility], [address] [place], [place] [address]      Total time spend on discharging this patient, including the last patient exam, discussing the hospital stay, instructions for ongoing care as it relates to all pertinent caregivers, as well as preparing the medical discharge records, prescriptions, and/or referrals as applicable, is 35 minutes.    Enzo Bi, MD  [facility] 09/03/2021, 1:57 PM

## 2021-09-03 NOTE — Progress Notes (Signed)
Physical Therapy Treatment Patient Details Name: Gary [name] MRN: [id number] DOB: 1946/05/04 Today's Date: 09/03/2021   History of Present Illness Pt. is  75 y.o. male who was admitted with hypoglycemia, resulting in a fall. Pt. sustained a  L 7th rib fracture, and multiple lacerations. PMHx includes: DM, HTN, HLD, COPD, GERD, Depression, lymphoma, CHF, PPM.    PT Comments    Pt was long sitting in bed upon arriving. He agrees to session and is cooperative throughout. " I don't feel as well today as yesterday." BP is a little soft this morning however did not limit session progression. He endorses pain in L ribs with all activity and movements. Continues to require significant assistance with all mobility, transfers, and ambulation. Pt lives alone and [place]ia does not feel he can safely manage without 24/7 assistance. Acute PT recommends DC to SNF to address deficits while maximizing independence with ADLs.    Recommendations for follow up therapy are one component of a multi-disciplinary discharge planning process, led by the attending physician.  Recommendations may be updated based on patient status, additional functional criteria and insurance authorization.  Follow Up Recommendations  Skilled nursing-short term rehab (<3 hours/day)     Assistance Recommended at Discharge Intermittent Supervision/Assistance  Equipment Recommendations  None recommended by PT       Precautions / Restrictions Precautions Precautions: Fall Restrictions Weight Bearing Restrictions: No     Mobility  Bed Mobility Overal bed mobility: Needs Assistance Bed Mobility: Supine to Sit;Sit to Supine     Supine to sit: Min assist Sit to supine: Mod assist   General bed mobility comments: HOB elevated, pt used bed rails. min assist to achieve short sit however mod assist to return to long sitting    Transfers Overall transfer level: Needs assistance Equipment used: Rolling walker (2 wheels) Transfers:  Sit to/from Stand Sit to Stand: Min assist           General transfer comment: Min assist to stand form lowest bed height. vcs for technique and sequencing improvements    Ambulation/Gait Ambulation/Gait assistance: Min guard Gait Distance (Feet): 50 Feet Assistive device: Rolling walker (2 wheels) Gait Pattern/deviations: Decreased stride length;Trunk flexed Gait velocity: decreased     General Gait Details: Pt has extremely poor standing posture. Slow gait kinematics with R foot turnout.      Balance Overall balance assessment: History of Falls;Needs assistance Sitting-balance support: Feet supported Sitting balance-Leahy Scale: Good     Standing balance support: Bilateral upper extremity supported;Reliant on assistive device for balance Standing balance-Leahy Scale: Fair       Cognition Arousal/Alertness: Awake/alert Behavior During Therapy: WFL for tasks assessed/performed Overall Cognitive Status: Within Functional Limits for tasks assessed    General Comments: Poor overall awareness of deficits           General Comments General comments (skin integrity, edema, etc.): Issued HEP to promote strengthening. pt performed several. Highly recommended to patient that rehab is needing prior to returning home [place].      Pertinent Vitals/Pain Pain Assessment: 0-10 Pain Score: 3  Faces Pain Scale: Hurts a little bit Pain Location: Left ribs Pain Descriptors / Indicators: Aching Pain Intervention(s): Limited activity within patient's tolerance;Monitored during session;Repositioned     PT Goals (current goals can now be found in the care plan section) Acute Rehab PT Goals Patient Stated Goal: get better and go home with my dog Progress towards PT goals: Progressing toward goals    Frequency    Min  2X/week      PT Plan Current plan remains appropriate       AM-PAC PT "6 Clicks" Mobility   Outcome Measure  Help needed turning from your back to your  side while in a flat bed without using bedrails?: A Little Help needed moving from lying on your back to sitting on the side of a flat bed without using bedrails?: A Little Help needed moving to and from a bed to a chair (including a wheelchair)?: A Little Help needed standing up from a chair using your arms (e.g., wheelchair or bedside chair)?: A Little Help needed to walk in hospital room?: A Lot Help needed climbing 3-5 steps with a railing? : A Lot 6 Click Score: 16    End of Session   Activity Tolerance: Patient tolerated treatment well;Patient limited by fatigue Patient left: in bed;with call bell/phone within reach;with bed alarm set Nurse Communication: Mobility status PT Visit Diagnosis: Muscle weakness (generalized) (M62.81);History of falling (Z91.81);Difficulty in walking, not elsewhere classified (R26.2);Pain Pain - Right/Left: Left Pain - part of body:  (ribs)     Time: 1308-6578 PT Time Calculation (min) (ACUTE ONLY): 23 min  Charges:  $Gait Training: 8-22 mins $Therapeutic Exercise: 8-22 mins                     Julaine Fusi PTA 09/03/21, 9:45 AM

## 2021-09-03 NOTE — Progress Notes (Signed)
Occupational Therapy Treatment Patient Details Name: KENDELL SAGRAVES MRN: [id number] DOB: [birth year] Today's Date: 09/03/2021   History of present illness Pt. is  75 y.o. male who was admitted with hypoglycemia, resulting in a fall. Pt. sustained a  L 7th rib fracture, and multiple lacerations. PMHx includes: DM, HTN, HLD, COPD, GERD, Depression, lymphoma, CHF, PPM.   OT comments  Pt seen for OT treatment on this date. Upon arrival to room, pt awake and seated upright in bed. Pt reporting 5/10 pain however agreeable to tx. Pt currently requires MIN A for bed mobility, MIN A for sit<>stand transfers, MIN GUARD for functional mobility of short household distances (41ft) with RW, and SUPERVISION-MIN A for standing grooming tasks d/t decreased balance, strength, and activity tolerance. Pt is making good progress toward goals and continues to benefit from skilled OT services to maximize return to PLOF and minimize risk of future falls, injury, caregiver burden, and readmission. Will continue to follow POC. Discharge recommendation remains appropriate.     Recommendations for follow up therapy are one component of a multi-disciplinary discharge planning process, led by the attending physician.  Recommendations may be updated based on patient status, additional functional criteria and insurance authorization.    Follow Up Recommendations  Skilled nursing-short term rehab (<3 hours/day)    Assistance Recommended at Discharge Frequent or constant Supervision/Assistance  Equipment Recommendations  BSC/3in1;Tub/shower bench       Precautions / Restrictions Precautions Precautions: Fall Restrictions Weight Bearing Restrictions: No       Mobility Bed Mobility Overal bed mobility: Needs Assistance Bed Mobility: Supine to Sit;Sit to Supine     Supine to sit: Min assist Sit to supine: Mod assist   General bed mobility comments: HOB elevated, pt used bed rails. min assist to achieve upright  sitting    Transfers Overall transfer level: Needs assistance Equipment used: Rolling walker (2 wheels) Transfers: Sit to/from Stand Sit to Stand: Min assist           General transfer comment: Min assist to stand form lowest bed height. vcs for technique and sequencing improvements     Balance Overall balance assessment: History of Falls;Needs assistance Sitting-balance support: Feet supported;No upper extremity supported Sitting balance-Leahy Scale: Good Sitting balance - Comments: Good sitting balance reaching within BOS   Standing balance support: Bilateral upper extremity supported;Reliant on assistive device for balance;During functional activity Standing balance-Leahy Scale: Fair Standing balance comment: Requires MIN GUARD for functional mobility of short household distance (41ft)                           ADL either performed or assessed with clinical judgement   ADL Overall ADL's : Needs assistance/impaired     Grooming: Wash/dry hands;Wash/dry face;Minimal assistance;Oral care;Standing;Applying deodorant;Set up Grooming Details (indicate cue type and reason): SET UP for deodorant in seated; MIN A for washing face in standing Upper Body Bathing: Supervision/ safety;Set up;Sitting       Upper Body Dressing : Supervision/safety;Set up;Sitting Upper Body Dressing Details (indicate cue type and reason): to don/doff hospital gown                 Functional mobility during ADLs: Min guard;Rolling walker (2 wheels) (to walk 10 ft)      Extremity/Trunk Assessment Upper Extremity Assessment Upper Extremity Assessment: Generalized weakness   Lower Extremity Assessment Lower Extremity Assessment: Generalized weakness        Vision Baseline Vision/History: 1 Wears glasses Patient  Visual Report: No change from baseline            Cognition Arousal/Alertness: Awake/alert Behavior During Therapy: WFL for tasks assessed/performed Overall  Cognitive Status: Within Functional Limits for tasks assessed                                 General Comments: Poor overall awareness of deficits                General Comments SpO2 >95%    Pertinent Vitals/ Pain       Pain Assessment: 0-10 Pain Score: 5  Faces Pain Scale: Hurts a little bit Pain Location: Left ribs Pain Descriptors / Indicators: Aching Pain Intervention(s): Limited activity within patient's tolerance;Monitored during session;Repositioned         Frequency  Min 3X/week        Progress Toward Goals  OT Goals(current goals can now be found in the care plan section)  Progress towards OT goals: Progressing toward goals  Acute Rehab OT Goals Patient Stated Goal: to return home OT Goal Formulation: With patient Time For Goal Achievement: 09/14/21 Potential to Achieve Goals: Good  Plan Discharge plan remains appropriate;Frequency remains appropriate       AM-PAC OT "6 Clicks" Daily Activity     Outcome Measure   Help from another person eating meals?: None Help from another person taking care of personal grooming?: A Little Help from another person toileting, which includes using toliet, bedpan, or urinal?: A Lot Help from another person bathing (including washing, rinsing, drying)?: A Lot Help from another person to put on and taking off regular upper body clothing?: A Little Help from another person to put on and taking off regular lower body clothing?: A Lot 6 Click Score: 16    End of Session Equipment Utilized During Treatment: Gait belt;Rolling walker (2 wheels)  OT Visit Diagnosis: Unsteadiness on feet (R26.81);Other abnormalities of gait and mobility (R26.89);Repeated falls (R29.6)   Activity Tolerance Patient tolerated treatment well   Patient Left in bed;with call bell/phone within reach;with nursing/sitter in room   Nurse Communication Mobility status        Time: 8338-2505 OT Time Calculation (min): 27  min  Charges: OT General Charges $OT Visit: 1 Visit OT Treatments $Self Care/Home Management : 23-37 mins  Fredirick Maudlin, [place]

## 2021-09-03 NOTE — Discharge Summary (Signed)
Physician Discharge Summary   Gary [name]  male DOB: 1946/07/28  ZOX:[id number]  PCP: [name], MD  Admit date: 08/29/2021 Discharge date: 09/03/2021  Admitted From: home Disposition:  SNF CODE STATUS: Full code  Discharge Instructions     Discharge wound care:   Complete by: As directed    Wound care to sutured lacerations on left hand: Cleanse with NS, apt dry. Apply a thin layer of bacitracin ointment. Leave open to air. [facility] Course:  For full details, please see H&P, progress notes, consult notes and ancillary notes.  Briefly,  Gary [name] is a 75 y.o. male with medical history significant of type 1 diabetes on insulin pump, hypertension, COPD, hypothyroidism, depression, peripheral neuropathy, gastroparesis, mantle cell lymphoma, hypothyroidism, Graves' disease, CHF, s/p of nephrectomy, CAD, CKD-3A, SSS, s/p of pacemaker placement, iron deficiency anemia, lymphedema, who presented with fall.   Pt is a poor historian.  History was limited.  Per report, patient recently lost his wife.  He lives alone at home.  Patient fell, and injured his left chest wall.  Complained of left rib cage pain.  He stated that he couldn't take deep breaths because of pain.    Of note, patient had fall and was seen in ED on 11/14. He had skin laceration in left hand which was sutured up. He had unremarkable CTs of his head, face and cervical spine that time.   Patient was found to have hypoglycemia with blood sugar 19 on presentation.  Insulin pump discontinued.  Pt transitioned to subQ insulin.  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  Insulin pump d/c'ed.   Diabetes coordinator consulted.   Recent A1c 6.9.   Based on the usage during hospitalization, pt is discharged on long-acting 16u daily, mealtime 6u TID and SSI ACHS.   --patient to follow up with Dr. Honor Junes to discuss  resuming insulin pump if appropriate after he is discharged from rehab facility.   Chest pain 2/2  Left rib fracture_7th rib Chest pain has resolved.   Trop neg. -Incentive spirometry   Hypercholesterolemia -Pravastatin   Fall-recurrent -PT/OT, recommend SNF   Iron deficiency anemia  Hemoglobin stable, 13.7 -Continue iron supplement   CKD (chronic kidney disease), stage IIIa Stable.  Baseline creatinine 8.1-1.9   Chronic systolic CHF (congestive heart failure) (HCC)  BNP 198, no respiratory distress.   Not acutely exacerbated -Continue Lasix as 40 mg daily -Continue Coreg, with holding parameter for systolic <147 or HR <82.   Hypokalemia -Monitored and replaced as necessary   Leukocytosis No source of infection identified.   No fever.  Urinalysis negative.  Likely reactive   HTN (hypertension) BP mostly low normal.   -Continue Lasix as 40 mg daily -Continue Coreg, with holding parameter for systolic <956 or HR <21.   Hypothyroid -Continue Synthroid   CAD (coronary artery disease): -Continue pravastatin   Skin laceration in left hand: -Wound care consult, wound care per order as above.   Depression Complicated grief Patient lost his wife of 50+  years 1 year ago Per patients sister, he has been very depressed over the past year Was previously on Prozac, which was restarted during this hospitalization.   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 The score is based on dignosis, age, lab data, medications, orders, and past utilization.   Low:  0-14.9   Medium: 15-21.9   High: 22-29.9   Extreme: 30 and above         Discharge Instructions:  Allergies as of 09/03/2021       Reactions   Rofecoxib Nausea Only        Medication List     STOP taking these medications    insulin aspart 100 UNIT/ML injection Commonly known as: NovoLOG Replaced by: insulin aspart 100 UNIT/ML injection   nystatin cream Commonly known as: MYCOSTATIN   potassium chloride 10 MEQ tablet Commonly known as: KLOR-CON       TAKE these medications    carvedilol 3.125 MG tablet Commonly known as: COREG Take 1 tablet (3.125 mg total) by mouth 2 (two) times daily with a meal. Hold if systolic <774 and HR <12 What changed: additional instructions   ferrous sulfate 325 (65 FE) MG tablet Take 325 mg by mouth daily with breakfast.   FLUoxetine 20 MG capsule Commonly known as: PROZAC Take 1 capsule (20 mg total) by mouth daily. Start taking on: September 04, 2021   furosemide 40 MG tablet Commonly known as: LASIX Take 1 tablet (40 mg total) by mouth daily. What changed:  how much to take when to take this additional instructions   glucose 4 GM chewable tablet Chew 1 tablet (4 g total) by mouth once as needed for low blood sugar.   insulin aspart 100 UNIT/ML injection Commonly known as: novoLOG Inject 0-9 Units into the skin 3 (three) times daily with meals. CBG 70 - 120: 0 units CBG 121 - 150: 1 unit CBG 151 - 200: 2 units CBG 201 - 250: 3 units CBG 251 - 300: 5 units CBG 301 - 350: 7 units CBG 351 - 400: 9  units Replaces: insulin aspart 100 UNIT/ML injection   insulin aspart 100 UNIT/ML injection Commonly known as: novoLOG Inject 0-5 Units into the skin at bedtime. CBG 70 - 120: 0 units CBG 121 - 150: 0 units CBG 151 - 200: 0 units CBG 201 - 250: 2 units CBG 251 - 300: 3 units CBG 301 - 350: 4 units CBG 351 - 400: 5 units   insulin aspart 100 UNIT/ML injection Commonly known as: novoLOG Inject 6 Units into the skin 3 (three) times daily with meals.   insulin glargine-yfgn 100 UNIT/ML injection Commonly known as: SEMGLEE Inject 0.16 mLs (16 Units total) into the skin daily. Start taking on: September 04, 2021   levothyroxine 25 MCG tablet Commonly known as: SYNTHROID Take 25 mcg by mouth daily before breakfast.   lidocaine 5 % Commonly known as: LIDODERM Place 1 patch onto the  skin daily. Remove & Discard patch within 12 hours or as directed by MD Start taking on: September 04, 2021   methocarbamol 750 MG tablet Commonly known as: ROBAXIN Take 1 tablet (750 mg total) by mouth every 6 (six) hours as needed for muscle spasms.   polyethylene glycol 17 g packet Commonly known as: MIRALAX / GLYCOLAX Take 17 g by mouth 2 (two) times daily.   polyvinyl alcohol 1.4 % ophthalmic solution Commonly known as: LIQUIFILM TEARS Place 1 drop into both eyes as needed for dry eyes.   pravastatin 40 MG tablet Commonly known as: PRAVACHOL Take 40 mg by mouth daily.   vitamin B-12 1000 MCG tablet Commonly known as: CYANOCOBALAMIN Take 1,000 mcg by mouth daily.   vitamin D3 50 MCG (2000 UT) Caps Take 2,000 Units by mouth daily.               Discharge Care Instructions  (From admission, onward)           Start     Ordered   09/03/21 0000  Discharge wound care:       Comments: Wound care to sutured lacerations on left hand: Cleanse with NS, apt dry. Apply a thin layer of bacitracin ointment. Leave open to air. - -   09/03/21 1313             Contact information for  follow-up providers     [name], MD Follow up.   Specialty: Internal Medicine Contact information: [address] [place] [address] [phone]              Contact information for after-discharge care     Destination     [facility] Preferred SNF .   Service: Skilled Nursing Contact information: [place] [phone]                     Allergies  Allergen Reactions   Rofecoxib Nausea Only     The results of significant diagnostics from this hospitalization (including imaging, microbiology, ancillary and laboratory) are listed below for reference.   Consultations:   Procedures/Studies: DG Chest 1 View  Result Date: 08/25/2021 CLINICAL DATA:  Lost balance leading to fall on concrete Dr rule out pneumothorax. EXAM: CHEST  1 VIEW COMPARISON:  Chest and left rib series earlier today. Chest CT 08/01/2021 FINDINGS: Right chest port tip in the SVC. Dual lead left-sided pacemaker in place. Unchanged heart size and mediastinal contours with stable mild cardiomegaly. No pneumothorax. No acute chest findings or interval change from earlier today. Emphysema with areas of scarring. Right proximal humerus deformity is chronic. IMPRESSION: No pneumothorax or interval change from earlier today. Electronically Signed   By: Keith Rake M.D.   On: 08/25/2021 15:34   DG Ribs Unilateral W/Chest Left  Result Date: 08/25/2021 CLINICAL DATA:  Fall EXAM: LEFT RIBS AND CHEST - 3+ VIEW COMPARISON:  07/14/2018 FINDINGS: No fracture or other bone lesions are seen involving the ribs. There is no evidence of pneumothorax or pleural effusion. Both lungs are clear. Cardiomegaly with left chest multi lead pacer. Right chest port catheter. IMPRESSION: 1. No displaced rib fracture or other radiographic abnormality of the ribs. 2. Cardiomegaly. Electronically Signed   By: Delanna Ahmadi M.D.   On:  08/25/2021 13:58   DG Knee 2 Views Left  Result Date: 08/25/2021 CLINICAL DATA:  Lost balance leading to fall on concrete driveway. EXAM: LEFT KNEE -  1-2 VIEW COMPARISON:  None. FINDINGS: No evidence of fracture, dislocation, or joint effusion. Minor degenerative change with peripheral spurring and spurring of the tibial spines. The alignment is normal. Joint spaces are preserved. Diffuse generalized soft tissue edema. IMPRESSION: Generalized soft tissue edema. No acute osseous abnormality. Electronically Signed   By: Keith Rake M.D.   On: 08/25/2021 15:37   CT HEAD WO CONTRAST (5MM)  Result Date: 08/29/2021 CLINICAL DATA:  Altered mental status, Unwitnessed fall, left rib pain EXAM: CT HEAD WITHOUT CONTRAST CT MAXILLOFACIAL WITHOUT CONTRAST CT CERVICAL SPINE WITHOUT CONTRAST CT CHEST, ABDOMEN AND PELVIS WITH CONTRAST TECHNIQUE: Contiguous axial images were obtained from the base of the skull through the vertex without intravenous contrast. Multidetector CT imaging of the maxillofacial structures was performed. Multiplanar CT image reconstructions were also generated. A small metallic BB was placed on the right temple in order to reliably differentiate right from left. Multidetector CT imaging of the cervical spine was performed without intravenous contrast. Multiplanar CT image reconstructions were also generated. Multidetector CT imaging of the chest, abdomen and pelvis was performed following the standard protocol during bolus administration of intravenous contrast. CONTRAST:  175mL OMNIPAQUE IOHEXOL 300 MG/ML  SOLN COMPARISON:  08/25/2021, 07/29/2020 FINDINGS: CT HEAD FINDINGS Brain: Normal anatomic configuration. Parenchymal volume loss is commensurate with the patient's age. Mild periventricular white matter changes are present likely reflecting the sequela of small vessel ischemia. No abnormal intra or extra-axial mass lesion or fluid collection. No abnormal mass effect or midline shift. No  evidence of acute intracranial hemorrhage or infarct. Ventricular size is normal. Cerebellum unremarkable. Vascular: No asymmetric hyperdense vasculature at the skull base. Skull: Intact Other: Mastoid air cells and middle ear cavities are clear. Small left frontal scalp hematoma noted. CT MAXILLOFACIAL FINDINGS Osseous: No fracture or mandibular dislocation. No destructive process. Orbits: Ocular lenses have been removed. The orbits are otherwise unremarkable. Sinuses: The paranasal sinuses are clear. Soft tissues: Mild left preseptal and infraorbital soft tissue swelling. CT CERVICAL SPINE FINDINGS Alignment: 2 mm anterolisthesis of C3 upon C4 is unchanged. Skull base and vertebrae: Craniocervical alignment is normal. Atlantodental interval is not widened. No acute fracture the cervical spine. Vertebral body height has been preserved. Soft tissues and spinal canal: No prevertebral fluid or swelling. No visible canal hematoma. Disc levels: Intervertebral disc space narrowing and endplate remodeling at I1-4 and C6-7 is in keeping with changes of moderate degenerative disc disease. Minimal degenerative changes are seen throughout the remainder of the cervical spine. The prevertebral soft tissues are not thickened on sagittal reformats. Mild central canal stenosis at C3-4 secondary to anterolisthesis and associated posterior disc osteophyte complex. Review of the axial images demonstrates multilevel uncovertebral and facet arthrosis resulting in moderate left and mild right neuroforaminal narrowing at C3-4 and C4-5. Other: None CT CHEST FINDINGS Cardiovascular: Moderate coronary artery calcification. Global cardiac size within normal limits. No pericardial effusion. Central pulmonary arteries are of normal caliber. The thoracic aorta is unremarkable. Left subclavian dual lead pacemaker is in place with leads within the right atrium and right ventricle. Left internal jugular chest port tip is seen within the superior  vena cava. Mediastinum/Nodes: No enlarged mediastinal, hilar, or axillary lymph nodes. Thyroid gland, trachea, and esophagus demonstrate no significant findings. Lungs/Pleura: Mild basilar predominant pulmonary fibrotic change. No focal pulmonary infiltrate. No pneumothorax or pleural effusion. No central obstructing mass. Musculoskeletal: An acute minimally displaced fracture of the left seventh rib is seen laterally. Multiple healed bilateral rib fractures are noted. CT ABDOMEN  AND PELVIS FINDINGS Hepatobiliary: No focal liver abnormality is seen. Status post cholecystectomy. No biliary dilatation. Pancreas: Unremarkable Spleen: Unremarkable Adrenals/Urinary Tract: Adrenal glands are unremarkable. Status post right nephrectomy. Left kidney is normal in size and position. Stable probable hyperdense renal cyst arising from the lower pole of the left kidney. Left kidney is otherwise unremarkable. Bladder unremarkable. Stomach/Bowel: The stomach, small bowel, and large bowel are unremarkable. No free intraperitoneal gas or fluid. Vascular/Lymphatic: Aortic atherosclerosis. No enlarged abdominal or pelvic lymph nodes. Reproductive: Mild prostatic enlargement. Other: Tiny left fat containing inguinal hernia Musculoskeletal: No acute bone abnormality within the abdomen and pelvis. Osseous structures are age-appropriate. IMPRESSION: No acute intracranial injury. No calvarial fracture. Small left frontal scalp hematoma. No acute facial fracture. Mild left preseptal and infraorbital soft tissue swelling. No acute fracture or listhesis of the cervical spine. Acute left seventh rib fracture.  No pneumothorax. No acute intra-abdominal pathology. Additional incidental findings as noted above. Aortic Atherosclerosis (ICD10-I70.0).  Fall Electronically Signed   By: Fidela Salisbury M.D.   On: 08/29/2021 02:56   CT HEAD WO CONTRAST (5MM)  Result Date: 08/25/2021 CLINICAL DATA:  Trauma, fall EXAM: CT HEAD WITHOUT CONTRAST  TECHNIQUE: Contiguous axial images were obtained from the base of the skull through the vertex without intravenous contrast. COMPARISON:  None. FINDINGS: Brain: There are no signs of bleeding within the cranium. Ventricles are not dilated. There is no shift of midline structures. Cortical sulci are prominent. Vascular: Unremarkable. Skull: There is subcutaneous contusion/hematoma in the left periorbital region and in the anterior frontal scalp. No definite fracture is seen in the calvarium. Sinuses/Orbits: There are no air-fluid levels or significant mucosal thickening. Other: None IMPRESSION: No acute intracranial findings are seen in noncontrast CT brain. There are no signs of bleeding within the cranium. There is subcutaneous contusion/hematoma in the frontal scalp and left periorbital region. Electronically Signed   By: Elmer Picker M.D.   On: 08/25/2021 13:56   CT Cervical Spine Wo Contrast  Result Date: 08/29/2021 CLINICAL DATA:  Altered mental status, Unwitnessed fall, left rib pain EXAM: CT HEAD WITHOUT CONTRAST CT MAXILLOFACIAL WITHOUT CONTRAST CT CERVICAL SPINE WITHOUT CONTRAST CT CHEST, ABDOMEN AND PELVIS WITH CONTRAST TECHNIQUE: Contiguous axial images were obtained from the base of the skull through the vertex without intravenous contrast. Multidetector CT imaging of the maxillofacial structures was performed. Multiplanar CT image reconstructions were also generated. A small metallic BB was placed on the right temple in order to reliably differentiate right from left. Multidetector CT imaging of the cervical spine was performed without intravenous contrast. Multiplanar CT image reconstructions were also generated. Multidetector CT imaging of the chest, abdomen and pelvis was performed following the standard protocol during bolus administration of intravenous contrast. CONTRAST:  114mL OMNIPAQUE IOHEXOL 300 MG/ML  SOLN COMPARISON:  08/25/2021, 07/29/2020 FINDINGS: CT HEAD FINDINGS Brain:  Normal anatomic configuration. Parenchymal volume loss is commensurate with the patient's age. Mild periventricular white matter changes are present likely reflecting the sequela of small vessel ischemia. No abnormal intra or extra-axial mass lesion or fluid collection. No abnormal mass effect or midline shift. No evidence of acute intracranial hemorrhage or infarct. Ventricular size is normal. Cerebellum unremarkable. Vascular: No asymmetric hyperdense vasculature at the skull base. Skull: Intact Other: Mastoid air cells and middle ear cavities are clear. Small left frontal scalp hematoma noted. CT MAXILLOFACIAL FINDINGS Osseous: No fracture or mandibular dislocation. No destructive process. Orbits: Ocular lenses have been removed. The orbits are otherwise unremarkable. Sinuses: The paranasal sinuses  are clear. Soft tissues: Mild left preseptal and infraorbital soft tissue swelling. CT CERVICAL SPINE FINDINGS Alignment: 2 mm anterolisthesis of C3 upon C4 is unchanged. Skull base and vertebrae: Craniocervical alignment is normal. Atlantodental interval is not widened. No acute fracture the cervical spine. Vertebral body height has been preserved. Soft tissues and spinal canal: No prevertebral fluid or swelling. No visible canal hematoma. Disc levels: Intervertebral disc space narrowing and endplate remodeling at D3-5 and C6-7 is in keeping with changes of moderate degenerative disc disease. Minimal degenerative changes are seen throughout the remainder of the cervical spine. The prevertebral soft tissues are not thickened on sagittal reformats. Mild central canal stenosis at C3-4 secondary to anterolisthesis and associated posterior disc osteophyte complex. Review of the axial images demonstrates multilevel uncovertebral and facet arthrosis resulting in moderate left and mild right neuroforaminal narrowing at C3-4 and C4-5. Other: None CT CHEST FINDINGS Cardiovascular: Moderate coronary artery calcification. Global  cardiac size within normal limits. No pericardial effusion. Central pulmonary arteries are of normal caliber. The thoracic aorta is unremarkable. Left subclavian dual lead pacemaker is in place with leads within the right atrium and right ventricle. Left internal jugular chest port tip is seen within the superior vena cava. Mediastinum/Nodes: No enlarged mediastinal, hilar, or axillary lymph nodes. Thyroid gland, trachea, and esophagus demonstrate no significant findings. Lungs/Pleura: Mild basilar predominant pulmonary fibrotic change. No focal pulmonary infiltrate. No pneumothorax or pleural effusion. No central obstructing mass. Musculoskeletal: An acute minimally displaced fracture of the left seventh rib is seen laterally. Multiple healed bilateral rib fractures are noted. CT ABDOMEN AND PELVIS FINDINGS Hepatobiliary: No focal liver abnormality is seen. Status post cholecystectomy. No biliary dilatation. Pancreas: Unremarkable Spleen: Unremarkable Adrenals/Urinary Tract: Adrenal glands are unremarkable. Status post right nephrectomy. Left kidney is normal in size and position. Stable probable hyperdense renal cyst arising from the lower pole of the left kidney. Left kidney is otherwise unremarkable. Bladder unremarkable. Stomach/Bowel: The stomach, small bowel, and large bowel are unremarkable. No free intraperitoneal gas or fluid. Vascular/Lymphatic: Aortic atherosclerosis. No enlarged abdominal or pelvic lymph nodes. Reproductive: Mild prostatic enlargement. Other: Tiny left fat containing inguinal hernia Musculoskeletal: No acute bone abnormality within the abdomen and pelvis. Osseous structures are age-appropriate. IMPRESSION: No acute intracranial injury. No calvarial fracture. Small left frontal scalp hematoma. No acute facial fracture. Mild left preseptal and infraorbital soft tissue swelling. No acute fracture or listhesis of the cervical spine. Acute left seventh rib fracture.  No pneumothorax. No  acute intra-abdominal pathology. Additional incidental findings as noted above. Aortic Atherosclerosis (ICD10-I70.0).  Fall Electronically Signed   By: Fidela Salisbury M.D.   On: 08/29/2021 02:56   CT Cervical Spine Wo Contrast  Result Date: 08/25/2021 CLINICAL DATA:  Fall with trauma to the face and forehead. EXAM: CT CERVICAL SPINE WITHOUT CONTRAST TECHNIQUE: Multidetector CT imaging of the cervical spine was performed without intravenous contrast. Multiplanar CT image reconstructions were also generated. COMPARISON:  10/28/2017 FINDINGS: Alignment: 2 mm degenerative anterolisthesis C3-4 and C4-5. Skull base and vertebrae: No regional fracture. Soft tissues and spinal canal: No sign of soft tissue injury. Disc levels: Foramen magnum widely patent. Ordinary osteoarthritis at the C1-2 articulation. C2-3: Bilateral facet arthropathy, new since the prior study. C3-4: Bilateral facet arthropathy, worsened since the previous study. 2 mm of anterolisthesis. Degenerative spondylosis. Bilateral foraminal stenosis. C4-5: Bilateral facet arthropathy, worsened since the previous study. 2 mm of anterolisthesis. Degenerative spondylosis. Bilateral foraminal stenosis. C5-6: Facet osteoarthritis on the right.  No stenosis. C6-7: Mild  spondylosis.  No stenosis. C7-T1: Normal. Upper chest: Negative Other: None IMPRESSION: No acute traumatic finding. Since the study of 2019, the patient has developed facet arthropathy in the upper cervical spine as outlined above. Foraminal stenosis at C3-4 and C4-5 could be symptomatic. Electronically Signed   By: Nelson Chimes M.D.   On: 08/25/2021 13:58   CT CHEST ABDOMEN PELVIS W CONTRAST  Result Date: 08/29/2021 CLINICAL DATA:  Altered mental status, Unwitnessed fall, left rib pain EXAM: CT HEAD WITHOUT CONTRAST CT MAXILLOFACIAL WITHOUT CONTRAST CT CERVICAL SPINE WITHOUT CONTRAST CT CHEST, ABDOMEN AND PELVIS WITH CONTRAST TECHNIQUE: Contiguous axial images were obtained from the base of  the skull through the vertex without intravenous contrast. Multidetector CT imaging of the maxillofacial structures was performed. Multiplanar CT image reconstructions were also generated. A small metallic BB was placed on the right temple in order to reliably differentiate right from left. Multidetector CT imaging of the cervical spine was performed without intravenous contrast. Multiplanar CT image reconstructions were also generated. Multidetector CT imaging of the chest, abdomen and pelvis was performed following the standard protocol during bolus administration of intravenous contrast. CONTRAST:  148mL OMNIPAQUE IOHEXOL 300 MG/ML  SOLN COMPARISON:  08/25/2021, 07/29/2020 FINDINGS: CT HEAD FINDINGS Brain: Normal anatomic configuration. Parenchymal volume loss is commensurate with the patient's age. Mild periventricular white matter changes are present likely reflecting the sequela of small vessel ischemia. No abnormal intra or extra-axial mass lesion or fluid collection. No abnormal mass effect or midline shift. No evidence of acute intracranial hemorrhage or infarct. Ventricular size is normal. Cerebellum unremarkable. Vascular: No asymmetric hyperdense vasculature at the skull base. Skull: Intact Other: Mastoid air cells and middle ear cavities are clear. Small left frontal scalp hematoma noted. CT MAXILLOFACIAL FINDINGS Osseous: No fracture or mandibular dislocation. No destructive process. Orbits: Ocular lenses have been removed. The orbits are otherwise unremarkable. Sinuses: The paranasal sinuses are clear. Soft tissues: Mild left preseptal and infraorbital soft tissue swelling. CT CERVICAL SPINE FINDINGS Alignment: 2 mm anterolisthesis of C3 upon C4 is unchanged. Skull base and vertebrae: Craniocervical alignment is normal. Atlantodental interval is not widened. No acute fracture the cervical spine. Vertebral body height has been preserved. Soft tissues and spinal canal: No prevertebral fluid or swelling.  No visible canal hematoma. Disc levels: Intervertebral disc space narrowing and endplate remodeling at V4-0 and C6-7 is in keeping with changes of moderate degenerative disc disease. Minimal degenerative changes are seen throughout the remainder of the cervical spine. The prevertebral soft tissues are not thickened on sagittal reformats. Mild central canal stenosis at C3-4 secondary to anterolisthesis and associated posterior disc osteophyte complex. Review of the axial images demonstrates multilevel uncovertebral and facet arthrosis resulting in moderate left and mild right neuroforaminal narrowing at C3-4 and C4-5. Other: None CT CHEST FINDINGS Cardiovascular: Moderate coronary artery calcification. Global cardiac size within normal limits. No pericardial effusion. Central pulmonary arteries are of normal caliber. The thoracic aorta is unremarkable. Left subclavian dual lead pacemaker is in place with leads within the right atrium and right ventricle. Left internal jugular chest port tip is seen within the superior vena cava. Mediastinum/Nodes: No enlarged mediastinal, hilar, or axillary lymph nodes. Thyroid gland, trachea, and esophagus demonstrate no significant findings. Lungs/Pleura: Mild basilar predominant pulmonary fibrotic change. No focal pulmonary infiltrate. No pneumothorax or pleural effusion. No central obstructing mass. Musculoskeletal: An acute minimally displaced fracture of the left seventh rib is seen laterally. Multiple healed bilateral rib fractures are noted. CT ABDOMEN AND PELVIS FINDINGS  Hepatobiliary: No focal liver abnormality is seen. Status post cholecystectomy. No biliary dilatation. Pancreas: Unremarkable Spleen: Unremarkable Adrenals/Urinary Tract: Adrenal glands are unremarkable. Status post right nephrectomy. Left kidney is normal in size and position. Stable probable hyperdense renal cyst arising from the lower pole of the left kidney. Left kidney is otherwise unremarkable. Bladder  unremarkable. Stomach/Bowel: The stomach, small bowel, and large bowel are unremarkable. No free intraperitoneal gas or fluid. Vascular/Lymphatic: Aortic atherosclerosis. No enlarged abdominal or pelvic lymph nodes. Reproductive: Mild prostatic enlargement. Other: Tiny left fat containing inguinal hernia Musculoskeletal: No acute bone abnormality within the abdomen and pelvis. Osseous structures are age-appropriate. IMPRESSION: No acute intracranial injury. No calvarial fracture. Small left frontal scalp hematoma. No acute facial fracture. Mild left preseptal and infraorbital soft tissue swelling. No acute fracture or listhesis of the cervical spine. Acute left seventh rib fracture.  No pneumothorax. No acute intra-abdominal pathology. Additional incidental findings as noted above. Aortic Atherosclerosis (ICD10-I70.0).  Fall Electronically Signed   By: Fidela Salisbury M.D.   On: 08/29/2021 02:56   DG Hand Complete Left  Result Date: 08/25/2021 CLINICAL DATA:  Lost balance leading to fall on concrete try fight. Left hand pain and bruising. EXAM: LEFT HAND - COMPLETE 3+ VIEW COMPARISON:  None. FINDINGS: There is no evidence of fracture or dislocation. Moderate osteoarthritis at the thumb carpal metacarpal joint. Lesser osteoarthritis of the distal interphalangeal joints of the digits. No erosion or periosteal reaction. Mild soft tissue edema over the dorsum of the metacarpals. IMPRESSION: 1. No acute fracture or subluxation. 2. Osteoarthritis, most prominent at the thumb carpometacarpal joint. Electronically Signed   By: Keith Rake M.D.   On: 08/25/2021 15:36   CT Maxillofacial Wo Contrast  Result Date: 08/29/2021 CLINICAL DATA:  Altered mental status, Unwitnessed fall, left rib pain EXAM: CT HEAD WITHOUT CONTRAST CT MAXILLOFACIAL WITHOUT CONTRAST CT CERVICAL SPINE WITHOUT CONTRAST CT CHEST, ABDOMEN AND PELVIS WITH CONTRAST TECHNIQUE: Contiguous axial images were obtained from the base of the skull  through the vertex without intravenous contrast. Multidetector CT imaging of the maxillofacial structures was performed. Multiplanar CT image reconstructions were also generated. A small metallic BB was placed on the right temple in order to reliably differentiate right from left. Multidetector CT imaging of the cervical spine was performed without intravenous contrast. Multiplanar CT image reconstructions were also generated. Multidetector CT imaging of the chest, abdomen and pelvis was performed following the standard protocol during bolus administration of intravenous contrast. CONTRAST:  159mL OMNIPAQUE IOHEXOL 300 MG/ML  SOLN COMPARISON:  08/25/2021, 07/29/2020 FINDINGS: CT HEAD FINDINGS Brain: Normal anatomic configuration. Parenchymal volume loss is commensurate with the patient's age. Mild periventricular white matter changes are present likely reflecting the sequela of small vessel ischemia. No abnormal intra or extra-axial mass lesion or fluid collection. No abnormal mass effect or midline shift. No evidence of acute intracranial hemorrhage or infarct. Ventricular size is normal. Cerebellum unremarkable. Vascular: No asymmetric hyperdense vasculature at the skull base. Skull: Intact Other: Mastoid air cells and middle ear cavities are clear. Small left frontal scalp hematoma noted. CT MAXILLOFACIAL FINDINGS Osseous: No fracture or mandibular dislocation. No destructive process. Orbits: Ocular lenses have been removed. The orbits are otherwise unremarkable. Sinuses: The paranasal sinuses are clear. Soft tissues: Mild left preseptal and infraorbital soft tissue swelling. CT CERVICAL SPINE FINDINGS Alignment: 2 mm anterolisthesis of C3 upon C4 is unchanged. Skull base and vertebrae: Craniocervical alignment is normal. Atlantodental interval is not widened. No acute fracture the cervical spine. Vertebral body  height has been preserved. Soft tissues and spinal canal: No prevertebral fluid or swelling. No  visible canal hematoma. Disc levels: Intervertebral disc space narrowing and endplate remodeling at Q6-8 and C6-7 is in keeping with changes of moderate degenerative disc disease. Minimal degenerative changes are seen throughout the remainder of the cervical spine. The prevertebral soft tissues are not thickened on sagittal reformats. Mild central canal stenosis at C3-4 secondary to anterolisthesis and associated posterior disc osteophyte complex. Review of the axial images demonstrates multilevel uncovertebral and facet arthrosis resulting in moderate left and mild right neuroforaminal narrowing at C3-4 and C4-5. Other: None CT CHEST FINDINGS Cardiovascular: Moderate coronary artery calcification. Global cardiac size within normal limits. No pericardial effusion. Central pulmonary arteries are of normal caliber. The thoracic aorta is unremarkable. Left subclavian dual lead pacemaker is in place with leads within the right atrium and right ventricle. Left internal jugular chest port tip is seen within the superior vena cava. Mediastinum/Nodes: No enlarged mediastinal, hilar, or axillary lymph nodes. Thyroid gland, trachea, and esophagus demonstrate no significant findings. Lungs/Pleura: Mild basilar predominant pulmonary fibrotic change. No focal pulmonary infiltrate. No pneumothorax or pleural effusion. No central obstructing mass. Musculoskeletal: An acute minimally displaced fracture of the left seventh rib is seen laterally. Multiple healed bilateral rib fractures are noted. CT ABDOMEN AND PELVIS FINDINGS Hepatobiliary: No focal liver abnormality is seen. Status post cholecystectomy. No biliary dilatation. Pancreas: Unremarkable Spleen: Unremarkable Adrenals/Urinary Tract: Adrenal glands are unremarkable. Status post right nephrectomy. Left kidney is normal in size and position. Stable probable hyperdense renal cyst arising from the lower pole of the left kidney. Left kidney is otherwise unremarkable. Bladder  unremarkable. Stomach/Bowel: The stomach, small bowel, and large bowel are unremarkable. No free intraperitoneal gas or fluid. Vascular/Lymphatic: Aortic atherosclerosis. No enlarged abdominal or pelvic lymph nodes. Reproductive: Mild prostatic enlargement. Other: Tiny left fat containing inguinal hernia Musculoskeletal: No acute bone abnormality within the abdomen and pelvis. Osseous structures are age-appropriate. IMPRESSION: No acute intracranial injury. No calvarial fracture. Small left frontal scalp hematoma. No acute facial fracture. Mild left preseptal and infraorbital soft tissue swelling. No acute fracture or listhesis of the cervical spine. Acute left seventh rib fracture.  No pneumothorax. No acute intra-abdominal pathology. Additional incidental findings as noted above. Aortic Atherosclerosis (ICD10-I70.0).  Fall Electronically Signed   By: Fidela Salisbury M.D.   On: 08/29/2021 02:56   CT Maxillofacial Wo Contrast  Result Date: 08/25/2021 CLINICAL DATA:  Fall after losing balance. EXAM: CT MAXILLOFACIAL WITHOUT CONTRAST TECHNIQUE: Multidetector CT imaging of the maxillofacial structures was performed. Multiplanar CT image reconstructions were also generated. COMPARISON:  None. FINDINGS: Osseous: No facial fracture. Orbits: No intraorbital injury. Periorbital hematoma superficially, left more than right. Sinuses: Clear.  No traumatic fluid. Soft tissues: Superficial hematoma in the periorbital regions left more than right and the forehead. Limited intracranial: Negative IMPRESSION: No facial fracture.  Superficial hematoma as above. Electronically Signed   By: Nelson Chimes M.D.   On: 08/25/2021 13:55      Labs: BNP (last 3 results) Recent Labs    08/29/21 0020  BNP 341.9*   Basic Metabolic Panel: Recent Labs  Lab 08/29/21 0020 08/30/21 0514 09/03/21 1222  NA 145 138 131*  K 3.1* 3.8 4.8  CL 107 108 98  CO2 25 20* 25  GLUCOSE 43* 178* 278*  BUN 28* 38* 36*  CREATININE 1.46* 1.59*  1.39*  CALCIUM 8.5* 7.7* 8.1*  MG 2.0  --  2.0   Liver Function Tests:  Recent Labs  Lab 08/29/21 0020  AST 17  ALT 7  ALKPHOS 108  BILITOT 0.5  PROT 7.2  ALBUMIN 3.7   No results for input(s): LIPASE, AMYLASE in the last 168 hours. Recent Labs  Lab 08/29/21 0020  AMMONIA 19   CBC: Recent Labs  Lab 08/29/21 0020 08/30/21 0514 09/03/21 1222  WBC 12.2* 6.4 3.5*  HGB 13.7 11.7* 13.3  HCT 41.7 35.0* 39.3  MCV 93.3 93.8 91.2  PLT 170 123* 94*   Cardiac Enzymes: No results for input(s): CKTOTAL, CKMB, CKMBINDEX, TROPONINI in the last 168 hours. BNP: Invalid input(s): POCBNP CBG: Recent Labs  Lab 09/02/21 1141 09/02/21 1622 09/02/21 2122 09/03/21 0758 09/03/21 1201  GLUCAP 170* 170* 189* 253* 284*   D-Dimer No results for input(s): DDIMER in the last 72 hours. Hgb A1c No results for input(s): HGBA1C in the last 72 hours. Lipid Profile No results for input(s): CHOL, HDL, LDLCALC, TRIG, CHOLHDL, LDLDIRECT in the last 72 hours. Thyroid function studies No results for input(s): TSH, T4TOTAL, T3FREE, THYROIDAB in the last 72 hours.  Invalid input(s): FREET3 Anemia work up No results for input(s): VITAMINB12, FOLATE, FERRITIN, TIBC, IRON, RETICCTPCT in the last 72 hours. Urinalysis    Component Value Date/Time   COLORURINE YELLOW (A) 08/29/2021 0221   APPEARANCEUR CLEAR (A) 08/29/2021 0221   APPEARANCEUR Cloudy 06/13/2014 0823   LABSPEC 1.019 08/29/2021 0221   LABSPEC 1.018 06/13/2014 0823   PHURINE 5.0 08/29/2021 0221   GLUCOSEU NEGATIVE 08/29/2021 0221   GLUCOSEU 50 mg/dL 06/13/2014 0823   HGBUR SMALL (A) 08/29/2021 0221   BILIRUBINUR NEGATIVE 08/29/2021 0221   BILIRUBINUR Negative 06/13/2014 0823   KETONESUR 5 (A) 08/29/2021 0221   PROTEINUR NEGATIVE 08/29/2021 0221   NITRITE NEGATIVE 08/29/2021 0221   LEUKOCYTESUR NEGATIVE 08/29/2021 0221   LEUKOCYTESUR 2+ 06/13/2014 0823   Sepsis Labs Invalid input(s): PROCALCITONIN,  WBC,   LACTICIDVEN Microbiology Recent Results (from the past 240 hour(s))  Resp Panel by RT-PCR (Flu A&B, Covid) Nasopharyngeal Swab     Status: None   Collection Time: 08/29/21 11:04 AM   Specimen: Nasopharyngeal Swab; Nasopharyngeal(NP) swabs in vial transport medium  Result Value Ref Range Status   SARS Coronavirus 2 by RT PCR NEGATIVE NEGATIVE Final    Comment: (NOTE) SARS-CoV-2 target nucleic acids are NOT DETECTED.  The SARS-CoV-2 RNA is generally detectable in upper respiratory specimens during the acute phase of infection. The lowest concentration of SARS-CoV-2 viral copies this assay can detect is 138 copies/mL. A negative result does not preclude SARS-Cov-2 infection and should not be used as the sole basis for treatment or other patient management decisions. A negative result may occur with  improper specimen collection/handling, submission of specimen other than nasopharyngeal swab, presence of viral mutation(s) within the areas targeted by this assay, and inadequate number of viral copies(<138 copies/mL). A negative result must be combined with clinical observations, patient history, and epidemiological information. The expected result is Negative.  Fact Sheet for Patients:  EntrepreneurPulse.com.au  Fact Sheet for Healthcare Providers:  IncredibleEmployment.be  This test is no t yet approved or cleared by the [place] FDA and  has been authorized for detection and/or diagnosis of SARS-CoV-2 by FDA under an Emergency Use Authorization (EUA). This EUA will remain  in effect (meaning this test can be used) for the duration of the COVID-19 declaration under Section 564(b)(1) of the Act, 21 U.S.C.section 360bbb-3(b)(1), unless the authorization is terminated  or revoked sooner.  Influenza A by PCR NEGATIVE NEGATIVE Final   Influenza B by PCR NEGATIVE NEGATIVE Final    Comment: (NOTE) The Xpert Xpress SARS-CoV-2/FLU/RSV plus  assay is intended as an aid in the diagnosis of influenza from Nasopharyngeal swab specimens and should not be used as a sole basis for treatment. Nasal washings and aspirates are unacceptable for Xpert Xpress SARS-CoV-2/FLU/RSV testing.  Fact Sheet for Patients: EntrepreneurPulse.com.au  Fact Sheet for Healthcare Providers: IncredibleEmployment.be  This test is not yet approved or cleared by the [place] FDA and has been authorized for detection and/or diagnosis of SARS-CoV-2 by FDA under an Emergency Use Authorization (EUA). This EUA will remain in effect (meaning this test can be used) for the duration of the COVID-19 declaration under Section 564(b)(1) of the Act, 21 U.S.C. section 360bbb-3(b)(1), unless the authorization is terminated or revoked.  Performed at [facility], [address] [place], [place] [address]      Total time spend on discharging this patient, including the last patient exam, discussing the hospital stay, instructions for ongoing care as it relates to all pertinent caregivers, as well as preparing the medical discharge records, prescriptions, and/or referrals as applicable, is 35 minutes.    Enzo Bi, MD  [facility] 09/03/2021, 1:57 PM

## 2021-09-03 NOTE — Progress Notes (Signed)
Inpatient Diabetes Program Recommendations  AACE/ADA: New Consensus Statement on Inpatient Glycemic Control   Target Ranges:  Prepandial:   less than 140 mg/dL      Peak postprandial:   less than 180 mg/dL (1-2 hours)      Critically ill patients:  140 - 180 mg/dL    Latest Reference Range & Units 09/02/21 07:59 09/02/21 11:41 09/02/21 16:22 09/02/21 21:22 09/03/21 07:58 09/03/21 12:01  Glucose-Capillary 70 - 99 mg/dL 149 (H) 170 (H) 170 (H) 189 (H) 253 (H) 284 (H)  (H): Data is abnormally high  Review of Glycemic Control  Diabetes history: DM1 Outpatient Diabetes medications: Medtronic 630G Insulin Pump with Novolog; Dexcom CGM Current orders for Inpatient glycemic control: Semglee 15 units daily, Novolog 0-9 units TID with meals, Novolog 0-5 units QHS, Novolog 4 units TID with meals   Inpatient Diabetes Program Recommendations:     Insulin: Please consider increasing Semglee to 16 units daily and meal coverage to Novolog 6 units TID with meals.   Outpatient: Would recommend discharging patient on SQ insulin regimen (will need basal, meal coverage, and correction scale since patient makes NO insulin) same regimen used while inpatient and have patient follow up with Dr. Honor Junes to discuss resuming insulin pump if appropriate after he is discharged from rehab facility.   NOTE: Per chart review Endocrinologist: Dr. Honor Junes with [facility]; Last seen 08/12/2021 Pump settings at that visit were as follows (no changes made): Basal rates Midnight = 0.675 6 AM = 0.8 12 PM = 0.75 9 PM = 0.775 TDD basal: 17.925 units  Bolus settings I/C: 9 ISF: 60 Target Glucose: 100-130 Active insulin time: 3 hours  Spoke with patient at bedside. He was sitting on side of bed getting ready to get up with PT. Explained to patient that we would continue using SQ insulin regimen while inpatient and it was recommended that he be discharged to rehab facility on SQ insulin regimen (same as used while  inpatient). Explained that for his safety, it would be recommended also that he stay on SQ insulin regimen until he follows up with Dr. Honor Junes to discuss resuming insulin pump. Patient states that he has Medtronic insulin pump and that the pump he has was exchanged for his prior pump which had a malfunction and had a cracked ring. Patient reports that his current pump is still under warranty so he can not get a newer insulin pump as Dr. Honor Junes has recommended. Encouraged patient to follow up with Dr. Honor Junes as soon as he gets out of rehab. Patient verbalized understanding of information discussed and states he has no questions at this time.  Thanks, Barnie Alderman, RN, MSN, CDE Diabetes Coordinator Inpatient Diabetes Program [phone] (Team Pager from 8am to 5pm)

## 2021-09-04 DIAGNOSIS — E162 Hypoglycemia, unspecified: Secondary | ICD-10-CM | POA: Diagnosis not present

## 2021-09-04 LAB — GLUCOSE, CAPILLARY
Glucose-Capillary: 230 mg/dL — ABNORMAL HIGH (ref 70–99)
Glucose-Capillary: 237 mg/dL — ABNORMAL HIGH (ref 70–99)
Glucose-Capillary: 165 mg/dL — ABNORMAL HIGH (ref 70–99)
Glucose-Capillary: 74 mg/dL (ref 70–99)

## 2021-09-04 NOTE — Progress Notes (Signed)
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 male with medical history significant of type 1 diabetes on insulin pump, hypertension, COPD, hypothyroidism, depression, peripheral neuropathy, gastroparesis, mantle cell lymphoma, hypothyroidism, Graves' disease, CHF, s/p of nephrectomy, CAD, CKD-3A, SSS, s/p of pacemaker placement, iron deficiency anemia, lymphedema, who presented with fall.   Pt is a poor historian.  History was limited.  Per report, patient recently lost his wife.  He lives alone at home.  Patient fell, and injured his left chest wall.  Complained of left rib cage pain.  He stated that he couldn't take deep breaths because of pain.     Of note, patient had fall and was seen in ED on 11/14. He had skin laceration in left hand which was sutured up. He had unremarkable CTs of his head, face and cervical spine that time.   Patient was found to have hypoglycemia with blood sugar 19 on presentation.  Insulin pump discontinued.  Pt transitioned to subQ insulin.   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  Insulin pump d/c'ed.   Diabetes coordinator consulted.   Recent A1c 6.9.   --cont glargine 16u daily --cont mealtime 6u TID  --cont SSI ACHS.   --patient to follow up with Dr. Honor Junes to discuss resuming insulin pump if appropriate after he is discharged  from rehab facility.   Chest pain 2/2  Left rib fracture_7th rib Chest pain has resolved.   Trop neg. -Incentive spirometry   Hypercholesterolemia --cont statin   Fall-recurrent -PT/OT, recommend SNF   Iron deficiency anemia  Hemoglobin stable, 13.7 -Continue iron supplement   CKD (chronic kidney disease), stage IIIa Stable.  Baseline creatinine 4.1-9.6   Chronic systolic CHF (congestive heart failure) (HCC)  BNP 198, no respiratory distress.  Not acutely exacerbated --cont lasix 40 mg daily -Continue Coreg, with holding parameter for systolic <222 or HR <97.   Hypokalemia -Monitored and replaced as necessary   Leukocytosis No source of infection identified.   No fever.  Urinalysis negative.  Likely reactive   HTN (hypertension) BP mostly low normal.   -Continue Lasix as 40 mg daily -Continue Coreg, with holding parameter for systolic <989 or HR <21.   Hypothyroid -Continue Synthroid   CAD (coronary artery disease): -Continue pravastatin   Skin laceration in left hand: -Wound care consult, wound care per order as above.   Depression Complicated grief Patient lost his wife of 50+  years 1 year ago Per patients sister, he has been very depressed over the past year Was previously on Prozac, which was restarted during this hospitalization.   --cont Prozac --Will need outpatient follow-up with PCP to discuss dose titration and response to medication  COVID infection, asymptomatic --pos on 11/23.  Currently in isolation. --no need to treat   DVT prophylaxis: Lovenox SQ Code Status: Full code  Family Communication:  Level of care: Med-Surg Dispo:   The patient is from: home Anticipated d/c is to: SNF Anticipated d/c date is: after 10 days of isolation  Patient currently is medically ready to d/c, however, couldn't go to SNF due to new COVID infection.   Subjective and Interval History:  Pt admitted to some congestion and mucus.  No dyspnea.      Objective: Vitals:   09/03/21 2104 09/04/21 0450 09/04/21 0812 09/04/21 1159  BP: (!) 121/52 110/61 103/66 (!) 107/58  Pulse: 66 62 60 60  Resp: 18 14 16 18   Temp: 98.1 F (36.7 C) 97.8 F (36.6 C) (!) 97.5 F (36.4 C) 97.6 F (36.4 C)  TempSrc: Oral Oral Oral Oral  SpO2: 100% 98% 97% 97%  Weight:      Height:        Intake/Output Summary (Last 24 hours) at 09/04/2021 1555 Last data filed at 09/04/2021 0900 Gross per 24 hour  Intake 200 ml  Output 700 ml  Net -500 ml   Filed Weights   08/30/21 0413  Weight: 102.1 kg    Examination:   Constitutional: NAD, AAOx3 HEENT: conjunctivae and lids normal, EOMI CV: No cyanosis.   RESP: normal respiratory effort, on RA Extremities: Unna boots on BLE SKIN: warm, dry, extensive bruising on face  Neuro: II - XII grossly intact.   Psych: Normal mood and affect.  Appropriate judgement and reason   Data Reviewed: I have personally reviewed following labs and imaging studies  CBC: Recent Labs  Lab 08/29/21 0020 08/30/21 0514 09/03/21 1222  WBC 12.2* 6.4 3.5*  HGB 13.7 11.7* 13.3  HCT 41.7 35.0* 39.3  MCV 93.3 93.8 91.2  PLT 170 123* 94*   Basic Metabolic Panel: Recent Labs  Lab 08/29/21 0020 08/30/21 0514 09/03/21 1222  NA 145 138 131*  K 3.1* 3.8 4.8  CL 107 108 98  CO2 25 20* 25  GLUCOSE 43* 178* 278*  BUN 28* 38* 36*  CREATININE 1.46* 1.59* 1.39*  CALCIUM 8.5* 7.7* 8.1*  MG 2.0  --  2.0   GFR: Estimated Creatinine Clearance: 57.6 mL/min (A) (by C-G formula based on SCr of 1.39 mg/dL (H)). Liver Function Tests: Recent Labs  Lab 08/29/21 0020  AST 17  ALT 7  ALKPHOS 108  BILITOT 0.5  PROT 7.2  ALBUMIN 3.7   No results for input(s): LIPASE, AMYLASE in the last 168 hours. Recent Labs  Lab 08/29/21 0020  AMMONIA 19   Coagulation Profile: Recent Labs  Lab 08/29/21 0020  INR 1.2   Cardiac Enzymes: No results for input(s): CKTOTAL, CKMB, CKMBINDEX, TROPONINI in the last 168 hours. BNP  (last 3 results) No results for input(s): PROBNP in the last 8760 hours. HbA1C: No results for input(s): HGBA1C in the last 72 hours. CBG: Recent Labs  Lab 09/03/21 1201 09/03/21 1634 09/03/21 2101 09/04/21 0812 09/04/21 1159  GLUCAP 284* 172* 264* 230* 237*   Lipid Profile: No results for input(s): CHOL, HDL, LDLCALC, TRIG, CHOLHDL, LDLDIRECT in the last 72 hours. Thyroid Function Tests: No results for input(s): TSH, T4TOTAL, FREET4, T3FREE, THYROIDAB in the last 72 hours. Anemia Panel: No results for input(s): VITAMINB12, FOLATE, FERRITIN, TIBC, IRON, RETICCTPCT in the last 72 hours. Sepsis Labs: No results for input(s): PROCALCITON, LATICACIDVEN in the last 168 hours.  Recent Results (from the past 240 hour(s))  Resp Panel by RT-PCR (Flu A&B, Covid) Nasopharyngeal Swab     Status: None   Collection Time: 08/29/21 11:04  AM   Specimen: Nasopharyngeal Swab; Nasopharyngeal(NP) swabs in vial transport medium  Result Value Ref Range Status   SARS Coronavirus 2 by RT PCR NEGATIVE NEGATIVE Final    Comment: (NOTE) SARS-CoV-2 target nucleic acids are NOT DETECTED.  The SARS-CoV-2 RNA is generally detectable in upper respiratory specimens during the acute phase of infection. The lowest concentration of SARS-CoV-2 viral copies this assay can detect is 138 copies/mL. A negative result does not preclude SARS-Cov-2 infection and should not be used as the sole basis for treatment or other patient management decisions. A negative result may occur with  improper specimen collection/handling, submission of specimen other than nasopharyngeal swab, presence of viral mutation(s) within the areas targeted by this assay, and inadequate number of viral copies(<138 copies/mL). A negative result must be combined with clinical observations, patient history, and epidemiological information. The expected result is Negative.  Fact Sheet for Patients:   EntrepreneurPulse.com.au  Fact Sheet for Healthcare Providers:  IncredibleEmployment.be  This test is no t yet approved or cleared by the [place] FDA and  has been authorized for detection and/or diagnosis of SARS-CoV-2 by FDA under an Emergency Use Authorization (EUA). This EUA will remain  in effect (meaning this test can be used) for the duration of the COVID-19 declaration under Section 564(b)(1) of the Act, 21 U.S.C.section 360bbb-3(b)(1), unless the authorization is terminated  or revoked sooner.       Influenza A by PCR NEGATIVE NEGATIVE Final   Influenza B by PCR NEGATIVE NEGATIVE Final    Comment: (NOTE) The Xpert Xpress SARS-CoV-2/FLU/RSV plus assay is intended as an aid in the diagnosis of influenza from Nasopharyngeal swab specimens and should not be used as a sole basis for treatment. Nasal washings and aspirates are unacceptable for Xpert Xpress SARS-CoV-2/FLU/RSV testing.  Fact Sheet for Patients: EntrepreneurPulse.com.au  Fact Sheet for Healthcare Providers: IncredibleEmployment.be  This test is not yet approved or cleared by the [place] FDA and has been authorized for detection and/or diagnosis of SARS-CoV-2 by FDA under an Emergency Use Authorization (EUA). This EUA will remain in effect (meaning this test can be used) for the duration of the COVID-19 declaration under Section 564(b)(1) of the Act, 21 U.S.C. section 360bbb-3(b)(1), unless the authorization is terminated or revoked.  Performed at [facility], [address] [place], [place] [address]   Resp Panel by RT-PCR (Flu A&B, Covid) Nasopharyngeal Swab     Status: Abnormal   Collection Time: 09/03/21 12:56 PM   Specimen: Nasopharyngeal Swab; Nasopharyngeal(NP) swabs in vial transport medium  Result Value Ref Range Status   SARS Coronavirus 2 by RT PCR POSITIVE (A) NEGATIVE Final    Comment: RESULT  CALLED TO, READ BACK BY AND VERIFIED WITH: EMILY RUEZ 09/03/21 1411 MU (NOTE) SARS-CoV-2 target nucleic acids are DETECTED.  The SARS-CoV-2 RNA is generally detectable in upper respiratory specimens during the acute phase of infection. Positive results are indicative of the presence of the identified virus, but do not rule out bacterial infection or co-infection with other pathogens not detected by the test. Clinical correlation with patient history and other diagnostic information is necessary to determine patient infection status. The expected result is Negative.  Fact Sheet for Patients: EntrepreneurPulse.com.au  Fact Sheet for Healthcare Providers: IncredibleEmployment.be  This test is not yet approved or cleared by the [place] FDA and  has been authorized for detection and/or diagnosis of SARS-CoV-2 by FDA under an Emergency Use Authorization (EUA).  This EUA will remain in effect (meaning  this test can be used)  for the duration of  the COVID-19 declaration under Section 564(b)(1) of the Act, 21 U.S.C. section 360bbb-3(b)(1), unless the authorization is terminated or revoked sooner.     Influenza A by PCR NEGATIVE NEGATIVE Final   Influenza B by PCR NEGATIVE NEGATIVE Final    Comment: (NOTE) The Xpert Xpress SARS-CoV-2/FLU/RSV plus assay is intended as an aid in the diagnosis of influenza from Nasopharyngeal swab specimens and should not be used as a sole basis for treatment. Nasal washings and aspirates are unacceptable for Xpert Xpress SARS-CoV-2/FLU/RSV testing.  Fact Sheet for Patients: EntrepreneurPulse.com.au  Fact Sheet for Healthcare Providers: IncredibleEmployment.be  This test is not yet approved or cleared by the [place] FDA and has been authorized for detection and/or diagnosis of SARS-CoV-2 by FDA under an Emergency Use Authorization (EUA). This EUA will remain in effect  (meaning this test can be used) for the duration of the COVID-19 declaration under Section 564(b)(1) of the Act, 21 U.S.C. section 360bbb-3(b)(1), unless the authorization is terminated or revoked.  Performed at [facility], [address] [place], [place] [address]   Resp Panel by RT-PCR (Flu A&B, Covid) Nasopharyngeal Swab     Status: Abnormal   Collection Time: 09/03/21  2:22 PM   Specimen: Nasopharyngeal Swab; Nasopharyngeal(NP) swabs in vial transport medium  Result Value Ref Range Status   SARS Coronavirus 2 by RT PCR POSITIVE (A) NEGATIVE Final    Comment: RESULT CALLED TO, READ BACK BY AND VERIFIED WITH: KRISTAN DAVIS 09/03/21 1551 MU  (NOTE) SARS-CoV-2 target nucleic acids are DETECTED.  The SARS-CoV-2 RNA is generally detectable in upper respiratory specimens during the acute phase of infection. Positive results are indicative of the presence of the identified virus, but do not rule out bacterial infection or co-infection with other pathogens not detected by the test. Clinical correlation with patient history and other diagnostic information is necessary to determine patient infection status. The expected result is Negative.  Fact Sheet for Patients: EntrepreneurPulse.com.au  Fact Sheet for Healthcare Providers: IncredibleEmployment.be  This test is not yet approved or cleared by the [place] FDA and  has been authorized for detection and/or diagnosis of SARS-CoV-2 by FDA under an Emergency Use Authorization (EUA).  This EUA will remain in effect (meaning this test can be u sed) for the duration of  the COVID-19 declaration under Section 564(b)(1) of the Act, 21 U.S.C. section 360bbb-3(b)(1), unless the authorization is terminated or revoked sooner.     Influenza A by PCR NEGATIVE NEGATIVE Final   Influenza B by PCR NEGATIVE NEGATIVE Final    Comment: (NOTE) The Xpert Xpress SARS-CoV-2/FLU/RSV plus assay is  intended as an aid in the diagnosis of influenza from Nasopharyngeal swab specimens and should not be used as a sole basis for treatment. Nasal washings and aspirates are unacceptable for Xpert Xpress SARS-CoV-2/FLU/RSV testing.  Fact Sheet for Patients: EntrepreneurPulse.com.au  Fact Sheet for Healthcare Providers: IncredibleEmployment.be  This test is not yet approved or cleared by the [place] FDA and has been authorized for detection and/or diagnosis of SARS-CoV-2 by FDA under an Emergency Use Authorization (EUA). This EUA will remain in effect (meaning this test can be used) for the duration of the COVID-19 declaration under Section 564(b)(1) of the Act, 21 U.S.C. section 360bbb-3(b)(1), unless the authorization is terminated or revoked.  Performed at [facility], [address], [place], [place] [address]       Radiology Studies: No results found.  Scheduled Meds:  bacitracin   Topical BID   carvedilol  3.125 mg Oral BID WC   cholecalciferol  2,000 Units Oral Daily   enoxaparin (LOVENOX) injection  0.5 mg/kg Subcutaneous Q24H   ferrous sulfate  325 mg Oral Q breakfast   FLUoxetine  20 mg Oral Daily   fluticasone  2 spray Each Nare Daily   furosemide  40 mg Oral Daily   insulin aspart  0-5 Units Subcutaneous QHS   insulin aspart  0-9 Units Subcutaneous TID WC   insulin aspart  6 Units Subcutaneous TID WC   insulin glargine-yfgn  16 Units Subcutaneous Daily   levothyroxine  25 mcg Oral QAC breakfast   lidocaine  1 patch Transdermal Q24H   methocarbamol  750 mg Oral QID   ondansetron (ZOFRAN) IV  4 mg Intravenous Once   polyethylene glycol  17 g Oral BID   pravastatin  40 mg Oral Daily   vitamin B-12  1,000 mcg Oral Daily   Continuous Infusions:   LOS: 6 days     Enzo Bi, MD [facility] If 7PM-7AM, please contact night-coverage 09/04/2021, 3:55 PM

## 2021-09-05 DIAGNOSIS — E162 Hypoglycemia, unspecified: Secondary | ICD-10-CM | POA: Diagnosis not present

## 2021-09-05 LAB — GLUCOSE, CAPILLARY
Glucose-Capillary: 53 mg/dL — ABNORMAL LOW (ref 70–99)
Glucose-Capillary: 57 mg/dL — ABNORMAL LOW (ref 70–99)
Glucose-Capillary: 100 mg/dL — ABNORMAL HIGH (ref 70–99)
Glucose-Capillary: 220 mg/dL — ABNORMAL HIGH (ref 70–99)
Glucose-Capillary: 231 mg/dL — ABNORMAL HIGH (ref 70–99)
Glucose-Capillary: 177 mg/dL — ABNORMAL HIGH (ref 70–99)
Glucose-Capillary: 145 mg/dL — ABNORMAL HIGH (ref 70–99)

## 2021-09-05 MED ORDER — LOPERAMIDE HCL 2 MG PO CAPS
2.0000 mg | ORAL_CAPSULE | ORAL | Status: AC | PRN
  Administered 2021-09-05 – 2021-09-09 (×3): 2 mg via ORAL
  Filled 2021-09-05 (×3): qty 1

## 2021-09-05 MED ORDER — INSULIN GLARGINE-YFGN 100 UNIT/ML ~~LOC~~ SOLN
19.0000 [IU] | Freq: Every day | SUBCUTANEOUS | Status: DC
  Administered 2021-09-06 – 2021-09-12 (×7): 19 [IU] via SUBCUTANEOUS
  Filled 2021-09-05 (×8): qty 0.19

## 2021-09-05 MED ORDER — INSULIN ASPART 100 UNIT/ML IJ SOLN
3.0000 [IU] | Freq: Three times a day (TID) | INTRAMUSCULAR | Status: DC
  Administered 2021-09-05 – 2021-09-08 (×8): 3 [IU] via SUBCUTANEOUS
  Filled 2021-09-05 (×10): qty 1

## 2021-09-05 NOTE — Progress Notes (Signed)
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 male with medical history significant of type 1 diabetes on insulin pump, hypertension, COPD, hypothyroidism, depression, peripheral neuropathy, gastroparesis, mantle cell lymphoma, hypothyroidism, Graves' disease, CHF, s/p of nephrectomy, CAD, CKD-3A, SSS, s/p of pacemaker placement, iron deficiency anemia, lymphedema, who presented with fall.   Pt is a poor historian.  History was limited.  Per report, patient recently lost his wife.  He lives alone at home.  Patient fell, and injured his left chest wall.  Complained of left rib cage pain.  He stated that he couldn't take deep breaths because of pain.     Of note, patient had fall and was seen in ED on 11/14. He had skin laceration in left hand which was sutured up. He had unremarkable CTs of his head, face and cervical spine that time.   Patient was found to have hypoglycemia with blood sugar 19 on presentation.  Insulin pump discontinued.  Pt transitioned to subQ insulin.   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  Insulin pump d/c'ed.   Diabetes coordinator consulted.   Recent A1c 6.9.   --increase glragine to 19u daily starting tomorrow --reduce mealtime to 3u TID --cont SSI ACHS.   --patient to follow up with Dr. Honor Junes to discuss resuming insulin pump if  appropriate after he is discharged from rehab facility.   Chest pain 2/2  Left rib fracture_7th rib Chest pain has resolved.   Trop neg. -Incentive spirometry   Hypercholesterolemia --cont statin   Fall-recurrent -PT/OT, recommend SNF   Iron deficiency anemia  Hemoglobin stable, 13.7 -Continue iron supplement   CKD (chronic kidney disease), stage IIIa Stable.  Baseline creatinine 9.4-7.6   Chronic systolic CHF (congestive heart failure) (HCC)  BNP 198, no respiratory distress.  Not acutely exacerbated --cont lasix 40 mg daily -Continue Coreg, with holding parameter for systolic <546 or HR <50.   Hypokalemia   Leukocytosis No source of infection identified.   No fever.  Urinalysis negative.  Likely reactive   HTN (hypertension) BP mostly low normal.   -Continue Lasix as 40 mg daily --d/c coreg since it's been held for 3 days due to low BP and low HR   Hypothyroid -Continue Synthroid   CAD (coronary artery disease): -Continue pravastatin   Skin laceration in left hand: -Wound care consult, wound care per order as above.   Depression Complicated grief Patient lost his wife of 50+  years 1 year ago Per patients sister, he has been very depressed over the past year Was previously on Prozac, which was restarted during this hospitalization.   --cont Prozac --Will need outpatient follow-up with PCP to discuss dose titration and response to medication  COVID infection, asymptomatic --pos on 11/23.  No hypoxic.  Currently in isolation. --no need to treat  Lymphedema and venous  stasis ulcer --wound care consulted, placed Unna boots on 11/21 --next dressing change on 11/28   DVT prophylaxis: Lovenox SQ Code Status: Full code  Family Communication:  Level of care: Med-Surg Dispo:   The patient is from: home Anticipated d/c is to: SNF Anticipated d/c date is: after 10 days of isolation  Patient currently is medically ready to d/c, however, couldn't go to SNF  due to new COVID infection.   Subjective and Interval History:  Pt had couple episodes of diarrhea today.     Objective: Vitals:   09/04/21 2054 09/05/21 0513 09/05/21 0822 09/05/21 1155  BP: (!) 110/55 118/63 (!) 104/56 110/71  Pulse: 67 62 70 63  Resp: 18 18 18 18   Temp: 98.6 F (37 C) 97.9 F (36.6 C) 97.7 F (36.5 C) 97.8 F (36.6 C)  TempSrc: Oral Oral Oral Oral  SpO2: 100% 99% 95% 99%  Weight:      Height:        Intake/Output Summary (Last 24 hours) at 09/05/2021 1606 Last data filed at 09/05/2021 1000 Gross per 24 hour  Intake 250 ml  Output 300 ml  Net -50 ml   Filed Weights   08/30/21 0413  Weight: 102.1 kg    Examination:   Constitutional: NAD, AAOx3 HEENT: conjunctivae and lids normal, EOMI CV: No cyanosis.   RESP: normal respiratory effort, on RA Extremities: Unna boots on BLE SKIN: warm, dry, extensive bruising on face Neuro: II - XII grossly intact.     Data Reviewed: I have personally reviewed following labs and imaging studies  CBC: Recent Labs  Lab 08/30/21 0514 09/03/21 1222  WBC 6.4 3.5*  HGB 11.7* 13.3  HCT 35.0* 39.3  MCV 93.8 91.2  PLT 123* 94*   Basic Metabolic Panel: Recent Labs  Lab 08/30/21 0514 09/03/21 1222  NA 138 131*  K 3.8 4.8  CL 108 98  CO2 20* 25  GLUCOSE 178* 278*  BUN 38* 36*  CREATININE 1.59* 1.39*  CALCIUM 7.7* 8.1*  MG  --  2.0   GFR: Estimated Creatinine Clearance: 57.6 mL/min (A) (by C-G formula based on SCr of 1.39 mg/dL (H)). Liver Function Tests: No results for input(s): AST, ALT, ALKPHOS, BILITOT, PROT, ALBUMIN in the last 168 hours.  No results for input(s): LIPASE, AMYLASE in the last 168 hours. No results for input(s): AMMONIA in the last 168 hours.  Coagulation Profile: No results for input(s): INR, PROTIME in the last 168 hours.  Cardiac Enzymes: No results for input(s): CKTOTAL, CKMB, CKMBINDEX, TROPONINI in the last 168 hours. BNP (last 3 results) No results for input(s):  PROBNP in the last 8760 hours. HbA1C: No results for input(s): HGBA1C in the last 72 hours. CBG: Recent Labs  Lab 09/05/21 0509 09/05/21 0516 09/05/21 0610 09/05/21 0859 09/05/21 1156  GLUCAP 57* 53* 100* 220* 231*   Lipid Profile: No results for input(s): CHOL, HDL, LDLCALC, TRIG, CHOLHDL, LDLDIRECT in the last 72 hours. Thyroid Function Tests: No results for input(s): TSH, T4TOTAL, FREET4, T3FREE, THYROIDAB in the last 72 hours. Anemia Panel: No results for input(s): VITAMINB12, FOLATE, FERRITIN, TIBC, IRON, RETICCTPCT in the last 72 hours. Sepsis Labs: No results for input(s): PROCALCITON, LATICACIDVEN in the last 168 hours.  Recent Results (from the past 240 hour(s))  Resp Panel by RT-PCR (Flu A&B, Covid) Nasopharyngeal Swab     Status: None   Collection Time: 08/29/21 11:04 AM   Specimen: Nasopharyngeal Swab; Nasopharyngeal(NP) swabs in vial transport medium  Result Value Ref  Range Status   SARS Coronavirus 2 by RT PCR NEGATIVE NEGATIVE Final    Comment: (NOTE) SARS-CoV-2 target nucleic acids are NOT DETECTED.  The SARS-CoV-2 RNA is generally detectable in upper respiratory specimens during the acute phase of infection. The lowest concentration of SARS-CoV-2 viral copies this assay can detect is 138 copies/mL. A negative result does not preclude SARS-Cov-2 infection and should not be used as the sole basis for treatment or other patient management decisions. A negative result may occur with  improper specimen collection/handling, submission of specimen other than nasopharyngeal swab, presence of viral mutation(s) within the areas targeted by this assay, and inadequate number of viral copies(<138 copies/mL). A negative result must be combined with clinical observations, patient history, and epidemiological information. The expected result is Negative.  Fact Sheet for Patients:  EntrepreneurPulse.com.au  Fact Sheet for Healthcare Providers:   IncredibleEmployment.be  This test is no t yet approved or cleared by the [place] FDA and  has been authorized for detection and/or diagnosis of SARS-CoV-2 by FDA under an Emergency Use Authorization (EUA). This EUA will remain  in effect (meaning this test can be used) for the duration of the COVID-19 declaration under Section 564(b)(1) of the Act, 21 U.S.C.section 360bbb-3(b)(1), unless the authorization is terminated  or revoked sooner.       Influenza A by PCR NEGATIVE NEGATIVE Final   Influenza B by PCR NEGATIVE NEGATIVE Final    Comment: (NOTE) The Xpert Xpress SARS-CoV-2/FLU/RSV plus assay is intended as an aid in the diagnosis of influenza from Nasopharyngeal swab specimens and should not be used as a sole basis for treatment. Nasal washings and aspirates are unacceptable for Xpert Xpress SARS-CoV-2/FLU/RSV testing.  Fact Sheet for Patients: EntrepreneurPulse.com.au  Fact Sheet for Healthcare Providers: IncredibleEmployment.be  This test is not yet approved or cleared by the [place] FDA and has been authorized for detection and/or diagnosis of SARS-CoV-2 by FDA under an Emergency Use Authorization (EUA). This EUA will remain in effect (meaning this test can be used) for the duration of the COVID-19 declaration under Section 564(b)(1) of the Act, 21 U.S.C. section 360bbb-3(b)(1), unless the authorization is terminated or revoked.  Performed at [facility], [address] [place], [place] [address]   Resp Panel by RT-PCR (Flu A&B, Covid) Nasopharyngeal Swab     Status: Abnormal   Collection Time: 09/03/21 12:56 PM   Specimen: Nasopharyngeal Swab; Nasopharyngeal(NP) swabs in vial transport medium  Result Value Ref Range Status   SARS Coronavirus 2 by RT PCR POSITIVE (A) NEGATIVE Final    Comment: RESULT CALLED TO, READ BACK BY AND VERIFIED WITH: EMILY RUEZ 09/03/21 1411  MU (NOTE) SARS-CoV-2 target nucleic acids are DETECTED.  The SARS-CoV-2 RNA is generally detectable in upper respiratory specimens during the acute phase of infection. Positive results are indicative of the presence of the identified virus, but do not rule out bacterial infection or co-infection with other pathogens not detected by the test. Clinical correlation with patient history and other diagnostic information is necessary to determine patient infection status. The expected result is Negative.  Fact Sheet for Patients: EntrepreneurPulse.com.au  Fact Sheet for Healthcare Providers: IncredibleEmployment.be  This test is not yet approved or cleared by the [place] FDA and  has been authorized for detection and/or diagnosis of SARS-CoV-2 by FDA under an Emergency Use Authorization (EUA).  This EUA will remain in effect (meaning this test can be used)  for the duration of  the COVID-19 declaration under Section  564(b)(1) of the Act, 21 U.S.C. section 360bbb-3(b)(1), unless the authorization is terminated or revoked sooner.     Influenza A by PCR NEGATIVE NEGATIVE Final   Influenza B by PCR NEGATIVE NEGATIVE Final    Comment: (NOTE) The Xpert Xpress SARS-CoV-2/FLU/RSV plus assay is intended as an aid in the diagnosis of influenza from Nasopharyngeal swab specimens and should not be used as a sole basis for treatment. Nasal washings and aspirates are unacceptable for Xpert Xpress SARS-CoV-2/FLU/RSV testing.  Fact Sheet for Patients: EntrepreneurPulse.com.au  Fact Sheet for Healthcare Providers: IncredibleEmployment.be  This test is not yet approved or cleared by the [place] FDA and has been authorized for detection and/or diagnosis of SARS-CoV-2 by FDA under an Emergency Use Authorization (EUA). This EUA will remain in effect (meaning this test can be used) for the duration of the COVID-19  declaration under Section 564(b)(1) of the Act, 21 U.S.C. section 360bbb-3(b)(1), unless the authorization is terminated or revoked.  Performed at [facility], [address] [place], [place] [address]   Resp Panel by RT-PCR (Flu A&B, Covid) Nasopharyngeal Swab     Status: Abnormal   Collection Time: 09/03/21  2:22 PM   Specimen: Nasopharyngeal Swab; Nasopharyngeal(NP) swabs in vial transport medium  Result Value Ref Range Status   SARS Coronavirus 2 by RT PCR POSITIVE (A) NEGATIVE Final    Comment: RESULT CALLED TO, READ BACK BY AND VERIFIED WITH: KRISTAN DAVIS 09/03/21 1551 MU  (NOTE) SARS-CoV-2 target nucleic acids are DETECTED.  The SARS-CoV-2 RNA is generally detectable in upper respiratory specimens during the acute phase of infection. Positive results are indicative of the presence of the identified virus, but do not rule out bacterial infection or co-infection with other pathogens not detected by the test. Clinical correlation with patient history and other diagnostic information is necessary to determine patient infection status. The expected result is Negative.  Fact Sheet for Patients: EntrepreneurPulse.com.au  Fact Sheet for Healthcare Providers: IncredibleEmployment.be  This test is not yet approved or cleared by the [place] FDA and  has been authorized for detection and/or diagnosis of SARS-CoV-2 by FDA under an Emergency Use Authorization (EUA).  This EUA will remain in effect (meaning this test can be u sed) for the duration of  the COVID-19 declaration under Section 564(b)(1) of the Act, 21 U.S.C. section 360bbb-3(b)(1), unless the authorization is terminated or revoked sooner.     Influenza A by PCR NEGATIVE NEGATIVE Final   Influenza B by PCR NEGATIVE NEGATIVE Final    Comment: (NOTE) The Xpert Xpress SARS-CoV-2/FLU/RSV plus assay is intended as an aid in the diagnosis of influenza from Nasopharyngeal  swab specimens and should not be used as a sole basis for treatment. Nasal washings and aspirates are unacceptable for Xpert Xpress SARS-CoV-2/FLU/RSV testing.  Fact Sheet for Patients: EntrepreneurPulse.com.au  Fact Sheet for Healthcare Providers: IncredibleEmployment.be  This test is not yet approved or cleared by the [place] FDA and has been authorized for detection and/or diagnosis of SARS-CoV-2 by FDA under an Emergency Use Authorization (EUA). This EUA will remain in effect (meaning this test can be used) for the duration of the COVID-19 declaration under Section 564(b)(1) of the Act, 21 U.S.C. section 360bbb-3(b)(1), unless the authorization is terminated or revoked.  Performed at [facility], [address], [place],  [address]       Radiology Studies: No results found.   Scheduled Meds:  bacitracin   Topical BID   carvedilol  3.125 mg Oral  BID WC   cholecalciferol  2,000 Units Oral Daily   enoxaparin (LOVENOX) injection  0.5 mg/kg Subcutaneous Q24H   ferrous sulfate  325 mg Oral Q breakfast   FLUoxetine  20 mg Oral Daily   fluticasone  2 spray Each Nare Daily   furosemide  40 mg Oral Daily   insulin aspart  0-5 Units Subcutaneous QHS   insulin aspart  0-9 Units Subcutaneous TID WC   insulin aspart  3 Units Subcutaneous TID WC   [START ON 09/06/2021] insulin glargine-yfgn  19 Units Subcutaneous Daily   levothyroxine  25 mcg Oral QAC breakfast   lidocaine  1 patch Transdermal Q24H   methocarbamol  750 mg Oral QID   ondansetron (ZOFRAN) IV  4 mg Intravenous Once   polyethylene glycol  17 g Oral BID   pravastatin  40 mg Oral Daily   vitamin B-12  1,000 mcg Oral Daily   Continuous Infusions:   LOS: 7 days     Enzo Bi, MD [facility] If 7PM-7AM, please contact night-coverage 09/05/2021, 4:06 PM

## 2021-09-06 ENCOUNTER — Inpatient Hospital Stay: Payer: Medicare Other

## 2021-09-06 DIAGNOSIS — E162 Hypoglycemia, unspecified: Secondary | ICD-10-CM | POA: Diagnosis not present

## 2021-09-06 LAB — GLUCOSE, CAPILLARY
Glucose-Capillary: 166 mg/dL — ABNORMAL HIGH (ref 70–99)
Glucose-Capillary: 207 mg/dL — ABNORMAL HIGH (ref 70–99)
Glucose-Capillary: 209 mg/dL — ABNORMAL HIGH (ref 70–99)
Glucose-Capillary: 212 mg/dL — ABNORMAL HIGH (ref 70–99)
Glucose-Capillary: 309 mg/dL — ABNORMAL HIGH (ref 70–99)
Glucose-Capillary: 70 mg/dL (ref 70–99)
Glucose-Capillary: 95 mg/dL (ref 70–99)

## 2021-09-06 MED ORDER — METHOCARBAMOL 750 MG PO TABS
750.0000 mg | ORAL_TABLET | Freq: Four times a day (QID) | ORAL | Status: DC | PRN
  Administered 2021-09-07 – 2021-09-10 (×5): 750 mg via ORAL
  Filled 2021-09-06 (×6): qty 1

## 2021-09-06 NOTE — Progress Notes (Signed)
Physical Therapy Treatment Patient Details Name: Gary [name] MRN: [id number] DOB: 07/20/1946 Today's Date: 09/06/2021   History of Present Illness Pt. is  75 y.o. male who was admitted with hypoglycemia, resulting in a fall. Pt. sustained a  L 7th rib fracture, and multiple lacerations. PMHx includes: DM, HTN, HLD, COPD, GERD, Depression, lymphoma, CHF, PPM.    PT Comments    Pt was asleep in long sitting upon arriving. He easily awakes and agrees to session with encouragement. He does endorse," I'm not feeling too well today." Pt was wearing 2 L o2 upon arriving however O2 discontinued throughout session with sao2 >90%. Very limited session due to dizziness with standing. No dynamap in room to check BP and pt unable to stand long enough to safely get one in time. Pt continues to c/o severe L rib pain and dizziness with any/all movements. Also requested author reapply O2 for comfort. RN aware. SNF still seems most appropriate due to limited activity tolerance and assistance required to perform all ADLs.    Recommendations for follow up therapy are one component of a multi-disciplinary discharge planning process, led by the attending physician.  Recommendations may be updated based on patient status, additional functional criteria and insurance authorization.  Follow Up Recommendations  Skilled nursing-short term rehab (<3 hours/day)     Assistance Recommended at Discharge Frequent or constant Supervision/Assistance  Equipment Recommendations  Other (comment) (defer to next level of care)       Precautions / Restrictions Precautions Precautions: Fall Restrictions Weight Bearing Restrictions: No     Mobility  Bed Mobility Overal bed mobility: Needs Assistance Bed Mobility: Supine to Sit;Sit to Supine     Supine to sit: Mod assist Sit to supine: Mod assist;Max assist;HOB elevated   General bed mobility comments: pt required increased assistance to exit L side of bed today.  Increased time and vcs throughout for imporved technique.    Transfers Overall transfer level: Needs assistance Equipment used: Rolling walker (2 wheels) Transfers: Sit to/from Stand Sit to Stand: Min assist;Mod assist;From elevated surface           General transfer comment: Pt stood 1 x EOB however c/o severe dizziness and was quickly assisted back to long sitting in bed. Took sveeral minutes for dizziness to resolve. Did not have dynamap in room to check BP. Pt quickly falls asleep once back in bed.    Ambulation/Gait    General Gait Details: unsafe to attempt away from EOB activity 2/2 to dizziness      Balance Overall balance assessment: History of Falls;Needs assistance Sitting-balance support: Feet supported;No upper extremity supported Sitting balance-Leahy Scale: Good     Standing balance support: Bilateral upper extremity supported;Reliant on assistive device for balance;During functional activity Standing balance-Leahy Scale: Fair      Cognition Arousal/Alertness: Lethargic Behavior During Therapy: WFL for tasks assessed/performed Overall Cognitive Status: Within Functional Limits for tasks assessed      General Comments: Poor overall awareness of deficits. Continues to c/o L rib pain           General Comments General comments (skin integrity, edema, etc.): Chief Strategy Officer discussed importance of pt performing HEP handout previously issued. He states he has been performing some (ankle pumps) but not any others. Author reviewed several exercises with pt. He quickly falls back to sleep afterwards.      Pertinent Vitals/Pain Pain Assessment: No/denies pain Pain Score: 0-No pain Faces Pain Scale: No hurt Pain Location: Left ribs Pain Descriptors / Indicators:  Aching Pain Intervention(s): Limited activity within patient's tolerance;Monitored during session;Premedicated before session;Repositioned     PT Goals (current goals can now be found in the care plan  section) Acute Rehab PT Goals Patient Stated Goal: go home and see my dog Progress towards PT goals: Not progressing toward goals - comment (limited today by dizziness upon standing)    Frequency    Min 2X/week      PT Plan Current plan remains appropriate       AM-PAC PT "6 Clicks" Mobility   Outcome Measure  Help needed turning from your back to your side while in a flat bed without using bedrails?: A Little Help needed moving from lying on your back to sitting on the side of a flat bed without using bedrails?: A Lot Help needed moving to and from a bed to a chair (including a wheelchair)?: A Lot Help needed standing up from a chair using your arms (e.g., wheelchair or bedside chair)?: A Lot Help needed to walk in hospital room?: A Lot Help needed climbing 3-5 steps with a railing? : A Lot 6 Click Score: 13    End of Session Equipment Utilized During Treatment: Oxygen Artist removed O2 however pt requested it be replaced at conclusion of session. sao2 > 90% even on rm air. pt feels more safe when wearing it) Activity Tolerance: Patient tolerated treatment well;Patient limited by fatigue;Other (comment) (limited by dizziness upon standing.) Patient left: in bed;with call bell/phone within reach;with bed alarm set Nurse Communication: Mobility status PT Visit Diagnosis: Muscle weakness (generalized) (M62.81);History of falling (Z91.81);Difficulty in walking, not elsewhere classified (R26.2);Pain Pain - Right/Left: Left Pain - part of body:  (ribs')     Time: 1548-1600 PT Time Calculation (min) (ACUTE ONLY): 12 min  Charges:  $Therapeutic Activity: 8-22 mins                    Julaine Fusi PTA 09/06/21, 4:08 PM

## 2021-09-06 NOTE — Progress Notes (Addendum)
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 male with medical history significant of type 1 diabetes on insulin pump, hypertension, COPD, hypothyroidism, depression, peripheral neuropathy, gastroparesis, mantle cell lymphoma, hypothyroidism, Graves' disease, CHF, s/p of nephrectomy, CAD, CKD-3A, SSS, s/p of pacemaker placement, iron deficiency anemia, lymphedema, who presented with fall.   Pt is a poor historian.  History was limited.  Per report, patient recently lost his wife.  He lives alone at home.  Patient fell, and injured his left chest wall.  Complained of left rib cage pain.  He stated that he couldn't take deep breaths because of pain.     Of note, patient had fall and was seen in ED on 11/14. He had skin laceration in left hand which was sutured up. He had unremarkable CTs of his head, face and cervical spine that time.   Patient was found to have hypoglycemia with blood sugar 19 on presentation.  Insulin pump discontinued.  Pt transitioned to subQ insulin.   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  Insulin pump d/c'ed.   Diabetes coordinator consulted.   Recent A1c 6.9.   --cont glargine 19u daily --cont mealtime 3u TID --cont SSI ACHS.   --patient to follow up with Gary [name] to discuss resuming insulin pump if appropriate after he is discharged  from rehab facility.   Chest pain 2/2  Left rib fracture_7th rib Chest pain has resolved.   Trop neg. -Incentive spirometry   Hypercholesterolemia --cont statin   Fall-recurrent -PT/OT, recommend SNF   Iron deficiency anemia  Hemoglobin stable, 13.7 -Continue iron supplement   CKD (chronic kidney disease), stage IIIa Stable.  Baseline creatinine 1.0-1.7   Chronic systolic CHF (congestive heart failure) (HCC)  BNP 198, no respiratory distress.  Not acutely exacerbated --cont lasix 40 mg daily --d/c coreg due to persistent low HR   Hypokalemia   Leukocytosis No source of infection identified.   No fever.  Urinalysis negative.  Likely reactive   HTN (hypertension) BP mostly low normal.   -Continue Lasix as 40 mg daily --d/c coreg due to low HR   Hypothyroid -Continue Synthroid   CAD (coronary artery disease): -Continue pravastatin   Skin laceration in left hand: -Wound care consult, wound care per order as above.   Depression Complicated grief Patient lost his wife of 50+  years 1 year ago Per patients sister, he has been very depressed over the past year Was previously on Prozac, which was restarted during this hospitalization.   --cont Prozac --Will need outpatient follow-up with PCP to discuss dose titration and response to medication  COVID infection --pos on 11/23.  Not hypoxic, although appeared more dyspneic now. --CXR today clear --Monitor respiratory status. --no indication to treat currently --supplemental O2 for comfort  Lymphedema and venous stasis ulcer --wound care consulted, placed Unna  boots on 11/21 --next dressing change on 11/28   DVT prophylaxis: Lovenox SQ Code Status: Full code  Family Communication:  Level of care: Med-Surg Dispo:   The patient is from: home Anticipated d/c is to: SNF Anticipated d/c date is: after 10 days of isolation  Patient currently is medically ready to d/c, however, couldn't go to SNF due to new  COVID infection.   Subjective and Interval History:  Pt reported diarrhea improved with Imodium.  RN reported pt appeared more dyspneic, however, no desat in RA while at rest.  CXR clear.     Objective: Vitals:   09/06/21 0036 09/06/21 0640 09/06/21 0801 09/06/21 1224  BP: 106/74 110/70 (!) 102/54 108/67  Pulse: 66 61 (!) 59 68  Resp: 20  20 18   Temp: 98.1 F (36.7 C) 97.8 F (36.6 C) 98.2 F (36.8 C) 98.6 F (37 C)  TempSrc:  Oral Oral   SpO2: 98% 99% 99% 100%  Weight:      Height:        Intake/Output Summary (Last 24 hours) at 09/06/2021 1605 Last data filed at 09/06/2021 0900 Gross per 24 hour  Intake 490 ml  Output 800 ml  Net -310 ml   Filed Weights   08/30/21 0413  Weight: 102.1 kg    Examination:   Constitutional: NAD, AAOx3 HEENT: conjunctivae and lids normal, EOMI CV: No cyanosis.   RESP: normal respiratory effort, on 2L Extremities: Unna boots on BLE SKIN: warm, dry, extensive bruising on his face Neuro: II - XII grossly intact.   Psych: depressed mood and affect.  Appropriate judgement and reason   Data Reviewed: I have personally reviewed following labs and imaging studies  CBC: Recent Labs  Lab 09/03/21 1222  WBC 3.5*  HGB 13.3  HCT 39.3  MCV 91.2  PLT 94*   Basic Metabolic Panel: Recent Labs  Lab 09/03/21 1222  NA 131*  K 4.8  CL 98  CO2 25  GLUCOSE 278*  BUN 36*  CREATININE 1.39*  CALCIUM 8.1*  MG 2.0   GFR: Estimated Creatinine Clearance: 57.6 mL/min (A) (by C-G formula based on SCr of 1.39 mg/dL (H)). Liver Function Tests: No results for input(s): AST, ALT, ALKPHOS, BILITOT, PROT, ALBUMIN in the last 168 hours.  No results for input(s): LIPASE, AMYLASE in the last 168 hours. No results for input(s): AMMONIA in the last 168 hours.  Coagulation Profile: No results for input(s): INR, PROTIME in the last 168 hours.  Cardiac Enzymes: No results for input(s): CKTOTAL, CKMB, CKMBINDEX, TROPONINI in the last 168  hours. BNP (last 3 results) No results for input(s): PROBNP in the last 8760 hours. HbA1C: No results for input(s): HGBA1C in the last 72 hours. CBG: Recent Labs  Lab 09/05/21 1611 09/05/21 2159 09/06/21 0643 09/06/21 0804 09/06/21 1222  GLUCAP 177* 145* 166* 209* 309*   Lipid Profile: No results for input(s): CHOL, HDL, LDLCALC, TRIG, CHOLHDL, LDLDIRECT in the last 72 hours. Thyroid Function Tests: No results for input(s): TSH, T4TOTAL, FREET4, T3FREE, THYROIDAB in the last 72 hours. Anemia Panel: No results for input(s): VITAMINB12, FOLATE, FERRITIN, TIBC, IRON, RETICCTPCT in the last 72 hours. Sepsis Labs: No results for input(s): PROCALCITON, LATICACIDVEN in the last 168 hours.  Recent Results (from the past 240 hour(s))  Resp Panel by RT-PCR (Flu A&B, Covid) Nasopharyngeal Swab     Status: None   Collection Time: 08/29/21 11:04 AM   Specimen: Nasopharyngeal Swab; Nasopharyngeal(NP) swabs in vial transport medium  Result Value  Ref Range Status   SARS Coronavirus 2 by RT PCR NEGATIVE NEGATIVE Final    Comment: (NOTE) SARS-CoV-2 target nucleic acids are NOT DETECTED.  The SARS-CoV-2 RNA is generally detectable in upper respiratory specimens during the acute phase of infection. The lowest concentration of SARS-CoV-2 viral copies this assay can detect is 138 copies/mL. A negative result does not preclude SARS-Cov-2 infection and should not be used as the sole basis for treatment or other patient management decisions. A negative result may occur with  improper specimen collection/handling, submission of specimen other than nasopharyngeal swab, presence of viral mutation(s) within the areas targeted by this assay, and inadequate number of viral copies(<138 copies/mL). A negative result must be combined with clinical observations, patient history, and epidemiological information. The expected result is Negative.  Fact Sheet for Patients:   EntrepreneurPulse.com.au  Fact Sheet for Healthcare Providers:  IncredibleEmployment.be  This test is no t yet approved or cleared by the [place] FDA and  has been authorized for detection and/or diagnosis of SARS-CoV-2 by FDA under an Emergency Use Authorization (EUA). This EUA will remain  in effect (meaning this test can be used) for the duration of the COVID-19 declaration under Section 564(b)(1) of the Act, 21 U.S.C.section 360bbb-3(b)(1), unless the authorization is terminated  or revoked sooner.       Influenza A by PCR NEGATIVE NEGATIVE Final   Influenza B by PCR NEGATIVE NEGATIVE Final    Comment: (NOTE) The Xpert Xpress SARS-CoV-2/FLU/RSV plus assay is intended as an aid in the diagnosis of influenza from Nasopharyngeal swab specimens and should not be used as a sole basis for treatment. Nasal washings and aspirates are unacceptable for Xpert Xpress SARS-CoV-2/FLU/RSV testing.  Fact Sheet for Patients: EntrepreneurPulse.com.au  Fact Sheet for Healthcare Providers: IncredibleEmployment.be  This test is not yet approved or cleared by the [place] FDA and has been authorized for detection and/or diagnosis of SARS-CoV-2 by FDA under an Emergency Use Authorization (EUA). This EUA will remain in effect (meaning this test can be used) for the duration of the COVID-19 declaration under Section 564(b)(1) of the Act, 21 U.S.C. section 360bbb-3(b)(1), unless the authorization is terminated or revoked.  Performed at [facility], [address] [place], [place] [address]   Resp Panel by RT-PCR (Flu A&B, Covid) Nasopharyngeal Swab     Status: Abnormal   Collection Time: 09/03/21 12:56 PM   Specimen: Nasopharyngeal Swab; Nasopharyngeal(NP) swabs in vial transport medium  Result Value Ref Range Status   SARS Coronavirus 2 by RT PCR POSITIVE (A) NEGATIVE Final    Comment: RESULT  CALLED TO, READ BACK BY AND VERIFIED WITH: EMILY RUEZ 09/03/21 1411 MU (NOTE) SARS-CoV-2 target nucleic acids are DETECTED.  The SARS-CoV-2 RNA is generally detectable in upper respiratory specimens during the acute phase of infection. Positive results are indicative of the presence of the identified virus, but do not rule out bacterial infection or co-infection with other pathogens not detected by the test. Clinical correlation with patient history and other diagnostic information is necessary to determine patient infection status. The expected result is Negative.  Fact Sheet for Patients: EntrepreneurPulse.com.au  Fact Sheet for Healthcare Providers: IncredibleEmployment.be  This test is not yet approved or cleared by the [place] FDA and  has been authorized for detection and/or diagnosis of SARS-CoV-2 by FDA under an Emergency Use Authorization (EUA).  This EUA will remain in effect (meaning this test can be used)  for the duration of  the COVID-19 declaration under  Section 564(b)(1) of the Act, 21 U.S.C. section 360bbb-3(b)(1), unless the authorization is terminated or revoked sooner.     Influenza A by PCR NEGATIVE NEGATIVE Final   Influenza B by PCR NEGATIVE NEGATIVE Final    Comment: (NOTE) The Xpert Xpress SARS-CoV-2/FLU/RSV plus assay is intended as an aid in the diagnosis of influenza from Nasopharyngeal swab specimens and should not be used as a sole basis for treatment. Nasal washings and aspirates are unacceptable for Xpert Xpress SARS-CoV-2/FLU/RSV testing.  Fact Sheet for Patients: EntrepreneurPulse.com.au  Fact Sheet for Healthcare Providers: IncredibleEmployment.be  This test is not yet approved or cleared by the [place] FDA and has been authorized for detection and/or diagnosis of SARS-CoV-2 by FDA under an Emergency Use Authorization (EUA). This EUA will remain in effect  (meaning this test can be used) for the duration of the COVID-19 declaration under Section 564(b)(1) of the Act, 21 U.S.C. section 360bbb-3(b)(1), unless the authorization is terminated or revoked.  Performed at [facility] [address] [place], [place] [address]   Resp Panel by RT-PCR (Flu A&B, Covid) Nasopharyngeal Swab     Status: Abnormal   Collection Time: 09/03/21  2:22 PM   Specimen: Nasopharyngeal Swab; Nasopharyngeal(NP) swabs in vial transport medium  Result Value Ref Range Status   SARS Coronavirus 2 by RT PCR POSITIVE (A) NEGATIVE Final    Comment: RESULT CALLED TO, READ BACK BY AND VERIFIED WITH: KRISTAN DAVIS 09/03/21 1551 MU  (NOTE) SARS-CoV-2 target nucleic acids are DETECTED.  The SARS-CoV-2 RNA is generally detectable in upper respiratory specimens during the acute phase of infection. Positive results are indicative of the presence of the identified virus, but do not rule out bacterial infection or co-infection with other pathogens not detected by the test. Clinical correlation with patient history and other diagnostic information is necessary to determine patient infection status. The expected result is Negative.  Fact Sheet for Patients: EntrepreneurPulse.com.au  Fact Sheet for Healthcare Providers: IncredibleEmployment.be  This test is not yet approved or cleared by the [place] FDA and  has been authorized for detection and/or diagnosis of SARS-CoV-2 by FDA under an Emergency Use Authorization (EUA).  This EUA will remain in effect (meaning this test can be u sed) for the duration of  the COVID-19 declaration under Section 564(b)(1) of the Act, 21 U.S.C. section 360bbb-3(b)(1), unless the authorization is terminated or revoked sooner.     Influenza A by PCR NEGATIVE NEGATIVE Final   Influenza B by PCR NEGATIVE NEGATIVE Final    Comment: (NOTE) The Xpert Xpress SARS-CoV-2/FLU/RSV plus assay is  intended as an aid in the diagnosis of influenza from Nasopharyngeal swab specimens and should not be used as a sole basis for treatment. Nasal washings and aspirates are unacceptable for Xpert Xpress SARS-CoV-2/FLU/RSV testing.  Fact Sheet for Patients: EntrepreneurPulse.com.au  Fact Sheet for Healthcare Providers: IncredibleEmployment.be  This test is not yet approved or cleared by the [place] FDA and has been authorized for detection and/or diagnosis of SARS-CoV-2 by FDA under an Emergency Use Authorization (EUA). This EUA will remain in effect (meaning this test can be used) for the duration of the COVID-19 declaration under Section 564(b)(1) of the Act, 21 U.S.C. section 360bbb-3(b)(1), unless the authorization is terminated or revoked.  Performed at [facility] [address] [place], [place] [address]       Radiology Studies: [facility] Chest [place] 1 View  Result Date: 09/06/2021 CLINICAL DATA:  Checkup due to covid 19. Patient has  known rib fractures. EXAM: PORTABLE CHEST 1 VIEW COMPARISON:  08/25/2021 and older exams.  CT, 08/29/2021. FINDINGS: Cardiac silhouette is normal in size. Stable dual lead left anterior chest wall pacemaker and right anterior chest wall Port-A-Cath. No mediastinal or hilar masses. Prominent vascular and interstitial markings bilaterally, similar to the prior chest radiograph. No lung consolidation. No convincing pleural effusion and no pneumothorax. Skeletal structures are demineralized. IMPRESSION: 1. No acute findings. No convincing pneumonia or pulmonary edema and no significant change from the prior chest radiograph. Electronically Signed   By: Lajean Manes M.D.   On: 09/06/2021 15:56     Scheduled Meds:  bacitracin   Topical BID   cholecalciferol  2,000 Units Oral Daily   enoxaparin (LOVENOX) injection  0.5 mg/kg Subcutaneous Q24H   ferrous sulfate  325 mg Oral Q breakfast   FLUoxetine  20 mg  Oral Daily   fluticasone  2 spray Each Nare Daily   furosemide  40 mg Oral Daily   insulin aspart  0-5 Units Subcutaneous QHS   insulin aspart  0-9 Units Subcutaneous TID WC   insulin aspart  3 Units Subcutaneous TID WC   insulin glargine-yfgn  19 Units Subcutaneous Daily   levothyroxine  25 mcg Oral QAC breakfast   lidocaine  1 patch Transdermal Q24H   methocarbamol  750 mg Oral QID   ondansetron (ZOFRAN) IV  4 mg Intravenous Once   polyethylene glycol  17 g Oral BID   pravastatin  40 mg Oral Daily   vitamin B-12  1,000 mcg Oral Daily   Continuous Infusions:   LOS: 8 days     Enzo Bi, MD [facility] If 7PM-7AM, please contact night-coverage 09/06/2021, 4:05 PM

## 2021-09-07 DIAGNOSIS — E162 Hypoglycemia, unspecified: Secondary | ICD-10-CM | POA: Diagnosis not present

## 2021-09-07 LAB — BASIC METABOLIC PANEL
Anion gap: 9 (ref 5–15)
BUN: 32 mg/dL — ABNORMAL HIGH (ref 8–23)
CO2: 30 mmol/L (ref 22–32)
Calcium: 8.2 mg/dL — ABNORMAL LOW (ref 8.9–10.3)
Chloride: 96 mmol/L — ABNORMAL LOW (ref 98–111)
Creatinine, Ser: 1.35 mg/dL — ABNORMAL HIGH (ref 0.61–1.24)
GFR, Estimated: 55 mL/min — ABNORMAL LOW (ref >60)
Glucose, Bld: 126 mg/dL — ABNORMAL HIGH (ref 70–99)
Potassium: 4.4 mmol/L (ref 3.5–5.1)
Sodium: 135 mmol/L (ref 135–145)

## 2021-09-07 LAB — GLUCOSE, CAPILLARY
Glucose-Capillary: 123 mg/dL — ABNORMAL HIGH (ref 70–99)
Glucose-Capillary: 142 mg/dL — ABNORMAL HIGH (ref 70–99)
Glucose-Capillary: 222 mg/dL — ABNORMAL HIGH (ref 70–99)
Glucose-Capillary: 230 mg/dL — ABNORMAL HIGH (ref 70–99)
Glucose-Capillary: 170 mg/dL — ABNORMAL HIGH (ref 70–99)

## 2021-09-07 LAB — CBC
HCT: 42.9 % (ref 39.0–52.0)
Hemoglobin: 14.2 g/dL (ref 13.0–17.0)
MCH: 30.1 pg (ref 26.0–34.0)
MCHC: 33.1 g/dL (ref 30.0–36.0)
MCV: 90.9 fL (ref 80.0–100.0)
Platelets: 103 10*3/uL — ABNORMAL LOW (ref 150–400)
RBC: 4.72 MIL/uL (ref 4.22–5.81)
RDW: 13 % (ref 11.5–15.5)
WBC: 2.9 10*3/uL — ABNORMAL LOW (ref 4.0–10.5)
nRBC: 0 % (ref 0.0–0.2)

## 2021-09-07 LAB — MAGNESIUM: Magnesium: 2.2 mg/dL (ref 1.7–2.4)

## 2021-09-07 LAB — C-REACTIVE PROTEIN: CRP: 1.1 mg/dL — ABNORMAL HIGH (ref <1.0)

## 2021-09-07 MED ORDER — INSULIN ASPART 100 UNIT/ML IJ SOLN
0.0000 [IU] | Freq: Three times a day (TID) | INTRAMUSCULAR | Status: DC
  Administered 2021-09-07 (×2): 2 [IU] via SUBCUTANEOUS
  Administered 2021-09-08: 18:00:00 3 [IU] via SUBCUTANEOUS
  Administered 2021-09-08 – 2021-09-09 (×2): 2 [IU] via SUBCUTANEOUS
  Administered 2021-09-09: 18:00:00 1 [IU] via SUBCUTANEOUS
  Administered 2021-09-10 (×2): 3 [IU] via SUBCUTANEOUS
  Administered 2021-09-10: 1 [IU] via SUBCUTANEOUS
  Administered 2021-09-11: 4 [IU] via SUBCUTANEOUS
  Administered 2021-09-11: 09:00:00 2 [IU] via SUBCUTANEOUS
  Administered 2021-09-11: 18:00:00 4 [IU] via SUBCUTANEOUS
  Administered 2021-09-12 (×2): 5 [IU] via SUBCUTANEOUS
  Administered 2021-09-12: 10:00:00 1 [IU] via SUBCUTANEOUS
  Filled 2021-09-07 (×15): qty 1

## 2021-09-07 MED ORDER — FLUOXETINE HCL 20 MG PO CAPS
40.0000 mg | ORAL_CAPSULE | Freq: Every day | ORAL | Status: DC
  Administered 2021-09-08 – 2021-09-13 (×6): 40 mg via ORAL
  Filled 2021-09-07 (×6): qty 2

## 2021-09-07 NOTE — Progress Notes (Signed)
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 male with medical history significant of type 1 diabetes on insulin pump, hypertension, COPD, hypothyroidism, depression, peripheral neuropathy, gastroparesis, mantle cell lymphoma, hypothyroidism, Graves' disease, CHF, s/p of nephrectomy, CAD, CKD-3A, SSS, s/p of pacemaker placement, iron deficiency anemia, lymphedema, who presented with fall.   Pt is a poor historian.  History was limited.  Per report, patient recently lost his wife.  He lives alone at home.  Patient fell, and injured his left chest wall.  Complained of left rib cage pain.  He stated that he couldn't take deep breaths because of pain.     Of note, patient had fall and was seen in ED on 11/14. He had skin laceration in left hand which was sutured up. He had unremarkable CTs of his head, face and cervical spine that time.   Patient was found to have hypoglycemia with blood sugar 19 on presentation.  Insulin pump discontinued.  Pt transitioned to subQ insulin.   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  Insulin pump d/c'ed.   Diabetes coordinator consulted.   Recent A1c 6.9.   --cont glargine 19u daily --cont mealtime 3u TID --change SSI to very sensitive scale --patient to follow up with Dr. Honor Junes to discuss resuming insulin pump if appropriate after  he is discharged from rehab facility.   Chest pain 2/2  Left rib fracture_7th rib Chest pain has resolved.   Trop neg. -Incentive spirometry   Hypercholesterolemia --cont statin   Fall-recurrent -PT/OT, recommend SNF   Iron deficiency anemia  Hemoglobin stable, 13.7 -Continue iron supplement   CKD (chronic kidney disease), stage IIIa Stable.  Baseline creatinine 9.6-2.2   Chronic systolic CHF (congestive heart failure) (HCC)  BNP 198, no respiratory distress.  Not acutely exacerbated --home coreg d/c'ed due to persistent low HR --cont home lasix 40 mg daily   Hypokalemia, resolved   Leukocytosis No source of infection identified.   No fever.  Urinalysis negative.  Likely reactive   HTN (hypertension) BP mostly low normal.   --home coreg d/c'ed due to persistent low HR --cont home lasix 40 mg daily   Hypothyroid -Continue Synthroid   CAD (coronary artery disease): -Continue pravastatin   Skin laceration in left hand: -Wound care consult, wound care per order as above.   Depression Complicated grief Patient lost his wife of 50+  years 1 year ago Per patients sister, he has been very depressed over the past year Was previously on Prozac, which was restarted during this hospitalization.   --increase Prozac to 40 mg daily  COVID infection --pos on 11/23.  Not hypoxic, although appeared more dyspneic now. --CXR clear --Monitor respiratory status. --no indication to treat currently --supplemental O2 for comfort  Lymphedema and venous stasis ulcer --wound care consulted, placed Unna boots on 11/21 --next dressing  change on 11/28   DVT prophylaxis: Lovenox SQ Code Status: Full code  Family Communication: sister updated on the phone today.  Level of care: Med-Surg Dispo:   The patient is from: home Anticipated d/c is to: SNF Anticipated d/c date is: after 10 days of isolation  Patient currently is medically ready to d/c, however, couldn't go to SNF  due to new COVID infection.   Subjective and Interval History:  Pt reported diarrhea, but not frequent.     Objective: Vitals:   09/06/21 2040 09/07/21 0513 09/07/21 0813 09/07/21 1242  BP: (!) 151/64 109/67 132/64 114/65  Pulse: 67 60 60 70  Resp: 17 16 20 20   Temp: (!) 97.4 F (36.3 C) 97.8 F (36.6 C) 97.6 F (36.4 C) 98.8 F (37.1 C)  TempSrc:   Oral   SpO2: 96% 99% 98% 96%  Weight:      Height:        Intake/Output Summary (Last 24 hours) at 09/07/2021 1532 Last data filed at 09/07/2021 1453 Gross per 24 hour  Intake --  Output 1790 ml  Net -1790 ml   Filed Weights   08/30/21 0413  Weight: 102.1 kg    Examination:   Constitutional: NAD, AAOx3, able to stand up to use the urinal HEENT: conjunctivae and lids normal, EOMI CV: No cyanosis.   RESP: normal respiratory effort, on RA Extremities: Unna boots on BLE SKIN: warm, dry, resolving bruises on face Neuro: II - XII grossly intact.   Psych: depressed mood and affect.     Data Reviewed: I have personally reviewed following labs and imaging studies  CBC: Recent Labs  Lab 09/03/21 1222 09/07/21 0504  WBC 3.5* 2.9*  HGB 13.3 14.2  HCT 39.3 42.9  MCV 91.2 90.9  PLT 94* 774*   Basic Metabolic Panel: Recent Labs  Lab 09/03/21 1222 09/07/21 0504  NA 131* 135  K 4.8 4.4  CL 98 96*  CO2 25 30  GLUCOSE 278* 126*  BUN 36* 32*  CREATININE 1.39* 1.35*  CALCIUM 8.1* 8.2*  MG 2.0 2.2   GFR: Estimated Creatinine Clearance: 59.3 mL/min (A) (by C-G formula based on SCr of 1.35 mg/dL (H)). Liver Function Tests: No results for input(s): AST, ALT, ALKPHOS, BILITOT, PROT, ALBUMIN in the last 168 hours.  No results for input(s): LIPASE, AMYLASE in the last 168 hours. No results for input(s): AMMONIA in the last 168 hours.  Coagulation Profile: No results for input(s): INR, PROTIME in the last 168 hours.  Cardiac Enzymes: No results for input(s): CKTOTAL, CKMB, CKMBINDEX, TROPONINI in the last 168  hours. BNP (last 3 results) No results for input(s): PROBNP in the last 8760 hours. HbA1C: No results for input(s): HGBA1C in the last 72 hours. CBG: Recent Labs  Lab 09/06/21 2047 09/06/21 2357 09/07/21 0516 09/07/21 0811 09/07/21 1240  GLUCAP 70 95 123* 142* 222*   Lipid Profile: No results for input(s): CHOL, HDL, LDLCALC, TRIG, CHOLHDL, LDLDIRECT in the last 72 hours. Thyroid Function Tests: No results for input(s): TSH, T4TOTAL, FREET4, T3FREE, THYROIDAB in the last 72 hours. Anemia Panel: No results for input(s): VITAMINB12, FOLATE, FERRITIN, TIBC, IRON, RETICCTPCT in the last 72 hours. Sepsis Labs: No results for input(s): PROCALCITON, LATICACIDVEN in the last 168 hours.  Recent Results (from the past 240 hour(s))  Resp Panel by RT-PCR (Flu A&B, Covid) Nasopharyngeal Swab     Status: None   Collection Time: 08/29/21 11:04 AM   Specimen: Nasopharyngeal Swab; Nasopharyngeal(NP) swabs in vial transport  medium  Result Value Ref Range Status   SARS Coronavirus 2 by RT PCR NEGATIVE NEGATIVE Final    Comment: (NOTE) SARS-CoV-2 target nucleic acids are NOT DETECTED.  The SARS-CoV-2 RNA is generally detectable in upper respiratory specimens during the acute phase of infection. The lowest concentration of SARS-CoV-2 viral copies this assay can detect is 138 copies/mL. A negative result does not preclude SARS-Cov-2 infection and should not be used as the sole basis for treatment or other patient management decisions. A negative result may occur with  improper specimen collection/handling, submission of specimen other than nasopharyngeal swab, presence of viral mutation(s) within the areas targeted by this assay, and inadequate number of viral copies(<138 copies/mL). A negative result must be combined with clinical observations, patient history, and epidemiological information. The expected result is Negative.  Fact Sheet for Patients:   EntrepreneurPulse.com.au  Fact Sheet for Healthcare Providers:  IncredibleEmployment.be  This test is no t yet approved or cleared by the [place] FDA and  has been authorized for detection and/or diagnosis of SARS-CoV-2 by FDA under an Emergency Use Authorization (EUA). This EUA will remain  in effect (meaning this test can be used) for the duration of the COVID-19 declaration under Section 564(b)(1) of the Act, 21 U.S.C.section 360bbb-3(b)(1), unless the authorization is terminated  or revoked sooner.       Influenza A by PCR NEGATIVE NEGATIVE Final   Influenza B by PCR NEGATIVE NEGATIVE Final    Comment: (NOTE) The Xpert Xpress SARS-CoV-2/FLU/RSV plus assay is intended as an aid in the diagnosis of influenza from Nasopharyngeal swab specimens and should not be used as a sole basis for treatment. Nasal washings and aspirates are unacceptable for Xpert Xpress SARS-CoV-2/FLU/RSV testing.  Fact Sheet for Patients: EntrepreneurPulse.com.au  Fact Sheet for Healthcare Providers: IncredibleEmployment.be  This test is not yet approved or cleared by the [place] FDA and has been authorized for detection and/or diagnosis of SARS-CoV-2 by FDA under an Emergency Use Authorization (EUA). This EUA will remain in effect (meaning this test can be used) for the duration of the COVID-19 declaration under Section 564(b)(1) of the Act, 21 U.S.C. section 360bbb-3(b)(1), unless the authorization is terminated or revoked.  Performed at [facility], [address] [place], [place] [address]   Resp Panel by RT-PCR (Flu A&B, Covid) Nasopharyngeal Swab     Status: Abnormal   Collection Time: 09/03/21 12:56 PM   Specimen: Nasopharyngeal Swab; Nasopharyngeal(NP) swabs in vial transport medium  Result Value Ref Range Status   SARS Coronavirus 2 by RT PCR POSITIVE (A) NEGATIVE Final    Comment: RESULT  CALLED TO, READ BACK BY AND VERIFIED WITH: EMILY RUEZ 09/03/21 1411 MU (NOTE) SARS-CoV-2 target nucleic acids are DETECTED.  The SARS-CoV-2 RNA is generally detectable in upper respiratory specimens during the acute phase of infection. Positive results are indicative of the presence of the identified virus, but do not rule out bacterial infection or co-infection with other pathogens not detected by the test. Clinical correlation with patient history and other diagnostic information is necessary to determine patient infection status. The expected result is Negative.  Fact Sheet for Patients: EntrepreneurPulse.com.au  Fact Sheet for Healthcare Providers: IncredibleEmployment.be  This test is not yet approved or cleared by the [place] FDA and  has been authorized for detection and/or diagnosis of SARS-CoV-2 by FDA under an Emergency Use Authorization (EUA).  This EUA will remain in effect (meaning this test can be used)  for the duration of  the COVID-19 declaration under Section 564(b)(1) of the Act, 21 U.S.C. section 360bbb-3(b)(1), unless the authorization is terminated or revoked sooner.     Influenza A by PCR NEGATIVE NEGATIVE Final   Influenza B by PCR NEGATIVE NEGATIVE Final    Comment: (NOTE) The Xpert Xpress SARS-CoV-2/FLU/RSV plus assay is intended as an aid in the diagnosis of influenza from Nasopharyngeal swab specimens and should not be used as a sole basis for treatment. Nasal washings and aspirates are unacceptable for Xpert Xpress SARS-CoV-2/FLU/RSV testing.  Fact Sheet for Patients: EntrepreneurPulse.com.au  Fact Sheet for Healthcare Providers: IncredibleEmployment.be  This test is not yet approved or cleared by the [place] FDA and has been authorized for detection and/or diagnosis of SARS-CoV-2 by FDA under an Emergency Use Authorization (EUA). This EUA will remain in effect  (meaning this test can be used) for the duration of the COVID-19 declaration under Section 564(b)(1) of the Act, 21 U.S.C. section 360bbb-3(b)(1), unless the authorization is terminated or revoked.  Performed at [facility], [address] [place], [place] [address]   Resp Panel by RT-PCR (Flu A&B, Covid) Nasopharyngeal Swab     Status: Abnormal   Collection Time: 09/03/21  2:22 PM   Specimen: Nasopharyngeal Swab; Nasopharyngeal(NP) swabs in vial transport medium  Result Value Ref Range Status   SARS Coronavirus 2 by RT PCR POSITIVE (A) NEGATIVE Final    Comment: RESULT CALLED TO, READ BACK BY AND VERIFIED WITH: KRISTAN DAVIS 09/03/21 1551 MU  (NOTE) SARS-CoV-2 target nucleic acids are DETECTED.  The SARS-CoV-2 RNA is generally detectable in upper respiratory specimens during the acute phase of infection. Positive results are indicative of the presence of the identified virus, but do not rule out bacterial infection or co-infection with other pathogens not detected by the test. Clinical correlation with patient history and other diagnostic information is necessary to determine patient infection status. The expected result is Negative.  Fact Sheet for Patients: EntrepreneurPulse.com.au  Fact Sheet for Healthcare Providers: IncredibleEmployment.be  This test is not yet approved or cleared by the [place] FDA and  has been authorized for detection and/or diagnosis of SARS-CoV-2 by FDA under an Emergency Use Authorization (EUA).  This EUA will remain in effect (meaning this test can be u sed) for the duration of  the COVID-19 declaration under Section 564(b)(1) of the Act, 21 U.S.C. section 360bbb-3(b)(1), unless the authorization is terminated or revoked sooner.     Influenza A by PCR NEGATIVE NEGATIVE Final   Influenza B by PCR NEGATIVE NEGATIVE Final    Comment: (NOTE) The Xpert Xpress SARS-CoV-2/FLU/RSV plus assay is  intended as an aid in the diagnosis of influenza from Nasopharyngeal swab specimens and should not be used as a sole basis for treatment. Nasal washings and aspirates are unacceptable for Xpert Xpress SARS-CoV-2/FLU/RSV testing.  Fact Sheet for Patients: EntrepreneurPulse.com.au  Fact Sheet for Healthcare Providers: IncredibleEmployment.be  This test is not yet approved or cleared by the [place] FDA and has been authorized for detection and/or diagnosis of SARS-CoV-2 by FDA under an Emergency Use Authorization (EUA). This EUA will remain in effect (meaning this test can be used) for the duration of the COVID-19 declaration under Section 564(b)(1) of the Act, 21 U.S.C. section 360bbb-3(b)(1), unless the authorization is terminated or revoked.  Performed at [facility], [address], [place], [place] [address]       Radiology Studies: [facility] Chest [place] 1 View  Result Date: 09/06/2021 CLINICAL DATA:  Checkup due to  covid 19. Patient has known rib fractures. EXAM: PORTABLE CHEST 1 VIEW COMPARISON:  08/25/2021 and older exams.  CT, 08/29/2021. FINDINGS: Cardiac silhouette is normal in size. Stable dual lead left anterior chest wall pacemaker and right anterior chest wall Port-A-Cath. No mediastinal or hilar masses. Prominent vascular and interstitial markings bilaterally, similar to the prior chest radiograph. No lung consolidation. No convincing pleural effusion and no pneumothorax. Skeletal structures are demineralized. IMPRESSION: 1. No acute findings. No convincing pneumonia or pulmonary edema and no significant change from the prior chest radiograph. Electronically Signed   By: Lajean Manes M.D.   On: 09/06/2021 15:56     Scheduled Meds:  bacitracin   Topical BID   cholecalciferol  2,000 Units Oral Daily   enoxaparin (LOVENOX) injection  0.5 mg/kg Subcutaneous Q24H   ferrous sulfate  325 mg Oral Q breakfast   FLUoxetine  20 mg  Oral Daily   fluticasone  2 spray Each Nare Daily   furosemide  40 mg Oral Daily   insulin aspart  0-6 Units Subcutaneous TID WC   insulin aspart  3 Units Subcutaneous TID WC   insulin glargine-yfgn  19 Units Subcutaneous Daily   levothyroxine  25 mcg Oral QAC breakfast   lidocaine  1 patch Transdermal Q24H   ondansetron (ZOFRAN) IV  4 mg Intravenous Once   polyethylene glycol  17 g Oral BID   pravastatin  40 mg Oral Daily   vitamin B-12  1,000 mcg Oral Daily   Continuous Infusions:   LOS: 9 days     Enzo Bi, MD [facility] If 7PM-7AM, please contact night-coverage 09/07/2021, 3:32 PM

## 2021-09-08 DIAGNOSIS — E162 Hypoglycemia, unspecified: Secondary | ICD-10-CM | POA: Diagnosis not present

## 2021-09-08 LAB — GLUCOSE, CAPILLARY
Glucose-Capillary: 110 mg/dL — ABNORMAL HIGH (ref 70–99)
Glucose-Capillary: 219 mg/dL — ABNORMAL HIGH (ref 70–99)
Glucose-Capillary: 263 mg/dL — ABNORMAL HIGH (ref 70–99)
Glucose-Capillary: 147 mg/dL — ABNORMAL HIGH (ref 70–99)

## 2021-09-08 MED ORDER — INSULIN ASPART 100 UNIT/ML IJ SOLN
4.0000 [IU] | Freq: Three times a day (TID) | INTRAMUSCULAR | Status: DC
Start: 2021-09-09 — End: 2021-09-11
  Administered 2021-09-09 – 2021-09-10 (×5): 4 [IU] via SUBCUTANEOUS
  Filled 2021-09-08 (×6): qty 1

## 2021-09-08 MED ORDER — GLUCERNA SHAKE PO LIQD
237.0000 mL | Freq: Two times a day (BID) | ORAL | Status: DC
  Administered 2021-09-09 – 2021-09-13 (×10): 237 mL via ORAL

## 2021-09-08 NOTE — Progress Notes (Signed)
Occupational Therapy Treatment Patient Details Name: Gary [name] MRN: [id number] DOB: [birth year] Today's Date: 09/08/2021   History of present illness Pt. is  75 y.o. male who was admitted with hypoglycemia, resulting in a fall. Pt. sustained a  L 7th rib fracture, and multiple lacerations. PMHx includes: DM, HTN, HLD, COPD, GERD, Depression, lymphoma, CHF, PPM.   OT comments  Pt seen for OT treatment on this date. Upon arrival to room, pt awake and seated upright in bed. Pt reporting no pain at rest and agreeable to OT tx. With HOB elevated, pt performed bed mobility MIN GUARD. Once seated EOB, pt endorsed mild dizziness following transfer, which resolved following seated LE therex. Pt required MIN GUARD for sit>stand transfer and MIN GUARD to walk bed<>sink with RW. While standing sinkside, pt required MIN A for steadying while using urinal to void and to complete x2 standing grooming tasks d/t decreased balance and activity tolerance. Pt only able to stand for 39mins, 2x, before reporting 9/10 on RPE scale, 6/10 pain in b/l knees, and requesting seated rest break (SpO2 98% and HR 120). At end of session, left in bed, in no acute distress with RN present. Pt continues to benefit from skilled OT services to maximize return to PLOF and minimize risk of future falls, injury, caregiver burden, and readmission. Will continue to follow POC. Discharge recommendation remains appropriate.     Recommendations for follow up therapy are one component of a multi-disciplinary discharge planning process, led by the attending physician.  Recommendations may be updated based on patient status, additional functional criteria and insurance authorization.    Follow Up Recommendations  Skilled nursing-short term rehab (<3 hours/day)    Assistance Recommended at Discharge Frequent or constant Supervision/Assistance  Equipment Recommendations  BSC/3in1;Tub/shower bench       Precautions / Restrictions  Precautions Precautions: Fall Restrictions Weight Bearing Restrictions: No       Mobility Bed Mobility Overal bed mobility: Needs Assistance Bed Mobility: Supine to Sit;Sit to Supine     Supine to sit: Min guard;HOB elevated Sit to supine: Min assist   General bed mobility comments: MIN A to bring LE back to bed. Pt endorsed mild dizziness following transfer, which resolved following seated LE therex    Transfers Overall transfer level: Needs assistance Equipment used: Rolling walker (2 wheels) Transfers: Sit to/from Stand Sit to Stand: Min guard;From elevated surface           General transfer comment: x4 bouts. Requires verbal cues for safe hand positioning with RW use.     Balance Overall balance assessment: History of Falls;Needs assistance Sitting-balance support: Feet supported;No upper extremity supported Sitting balance-Leahy Scale: Good Sitting balance - Comments: Good sitting balance reaching within BOS   Standing balance support: No upper extremity supported;During functional activity Standing balance-Leahy Scale: Poor Standing balance comment: Pt with posterior lean with UE unsupported during standing ADLs, requiring CGA-MIN A                           ADL either performed or assessed with clinical judgement   ADL Overall ADL's : Needs assistance/impaired     Grooming: Wash/dry face;Wash/dry hands;Minimal assistance Grooming Details (indicate cue type and reason): Requires MIN A for standing d/t posterior lean when UE unsupported         Upper Body Dressing : Supervision/safety;Set up;Sitting Upper Body Dressing Details (indicate cue type and reason): to don/doff hospital gown  Toileting- Clothing Manipulation and Hygiene: Minimal assistance;Sit to/from stand Toileting - Clothing Manipulation Details (indicate cue type and reason): To use urinal while standing; requires MIN A for standing d/t posterior lean when UE  unsupported              Cognition Arousal/Alertness: Awake/alert Behavior During Therapy: WFL for tasks assessed/performed Overall Cognitive Status: Within Functional Limits for tasks assessed                                            Exercises General Exercises - Lower Extremity Long Arc Quad: AROM;Both;15 reps;Seated           Pertinent Vitals/ Pain       Pain Assessment: Faces Faces Pain Scale: Hurts even more Pain Location: B knees (after standing) Pain Descriptors / Indicators: Aching Pain Intervention(s): Limited activity within patient's tolerance;Monitored during session         Frequency  Min 3X/week        Progress Toward Goals  OT Goals(current goals can now be found in the care plan section)  Progress towards OT goals: Progressing toward goals  Acute Rehab OT Goals Patient Stated Goal: to return home OT Goal Formulation: With patient Time For Goal Achievement: 09/14/21 Potential to Achieve Goals: [place] Discharge plan remains appropriate;Frequency remains appropriate       AM-PAC OT "6 Clicks" Daily Activity     Outcome Measure   Help from another person eating meals?: None Help from another person taking care of personal grooming?: A Little Help from another person toileting, which includes using toliet, bedpan, or urinal?: A Lot   Help from another person to put on and taking off regular upper body clothing?: A Little Help from another person to put on and taking off regular lower body clothing?: A Lot 6 Click Score: 14    End of Session Equipment Utilized During Treatment: Rolling walker (2 wheels)  OT Visit Diagnosis: Unsteadiness on feet (R26.81);Other abnormalities of gait and mobility (R26.89);Repeated falls (R29.6)   Activity Tolerance Patient tolerated treatment well   Patient Left in bed;with call bell/phone within reach;with nursing/sitter in room;with bed alarm set   Nurse Communication Mobility  status        Time: 8088-1103 OT Time Calculation (min): 27 min  Charges: OT General Charges $OT Visit: 1 Visit OT Treatments $Self Care/Home Management : 23-37 mins  Fredirick Maudlin, [place]

## 2021-09-08 NOTE — TOC Progression Note (Signed)
Transition of Care [facility]) - Progression Note    Patient Details  Name: Gary [name] MRN: [id number] Date of Birth: 07/27/1946  Transition of Care [facility]) CM/SW Contact  Shelbie Hutching, RN Phone Number: 09/08/2021, 3:17 PM  Clinical Narrative:    Five more days until patient can discharge to [facility] for short term rehab.  PT is still working with patient here in the hospital.     Expected Discharge Plan: Skilled Nursing Facility Barriers to Discharge: Continued Medical Work up  Expected Discharge Plan and Services Expected Discharge Plan: [place] In-house Referral: Clinical Social Work Discharge Planning Services: CM Consult Post Acute Care Choice: [employer] arrangements for the past 2 months: Single Family Home Expected Discharge Date: 09/03/21               DME Arranged: N/A DME Agency: NA       HH Arranged: NA HH Agency: NA         Social Determinants of Health (SDOH) Interventions    Readmission Risk Interventions Readmission Risk Prevention Plan 08/30/2021  Transportation Screening Complete  PCP or Specialist Appt within 3-5 Days Complete  HRI or [place] Complete  Social Work Consult for [facility] Planning/Counseling Complete  Palliative Care Screening Not Applicable  Medication Review [employer]) Complete  Some recent data might be hidden

## 2021-09-08 NOTE — Progress Notes (Signed)
Physical Therapy Treatment Patient Details Name: Gary [name] MRN: [id number] DOB: Dec 23, 1945 Today's Date: 09/08/2021   History of Present Illness Pt. is  75 y.o. male who was admitted with hypoglycemia, resulting in a fall. Pt. sustained a  L 7th rib fracture, and multiple lacerations. PMHx includes: DM, HTN, HLD, COPD, GERD, Depression, lymphoma, CHF, PPM.    PT Comments    Pt received supine in bed, agreeable to therapy. Requests to remain within room for ambulation today and to return to bed at end of session. Pt continued to report dizziness with standing today however mild compared to previous session. Standing mobility was performed using RW for stability. Pt required close CGA to steady and frequent communication/VC while in standing to open eyes and complete bout of ambulation. Pt remained sitting EOB for >3 minutes reporting it felt good to sit up - PT replaced soiled linens at this time. Pt continues to present with muscular weakness and limited endurance during functional mobility. Would benefit from skilled PT to address above deficits and promote optimal return to PLOF.   Recommendations for follow up therapy are one component of a multi-disciplinary discharge planning process, led by the attending physician.  Recommendations may be updated based on patient status, additional functional criteria and insurance authorization.  Follow Up Recommendations  Skilled nursing-short term rehab (<3 hours/day)     Assistance Recommended at Discharge Frequent or constant Supervision/Assistance  Equipment Recommendations  Other (comment) (TBD at next venue of care)    Recommendations for Other Services       Precautions / Restrictions Precautions Precautions: Fall Restrictions Weight Bearing Restrictions: No     Mobility  Bed Mobility Overal bed mobility: Needs Assistance Bed Mobility: Supine to Sit;Sit to Supine     Supine to sit: Min guard;HOB elevated Sit to supine: Min  guard   General bed mobility comments: Increased time and effort to complete while using bed features.    Transfers Overall transfer level: Needs assistance Equipment used: Rolling walker (2 wheels) Transfers: Sit to/from Stand Sit to Stand: Min guard;From elevated surface           General transfer comment: STS x2 reps from elevated EOB; VC on hand positioning for RW. Mild dizziness that decraesed with time.    Ambulation/Gait Ambulation/Gait assistance: Min guard Gait Distance (Feet): 30 Feet Assistive device: Rolling walker (2 wheels) Gait Pattern/deviations: Decreased stride length;Trunk flexed Gait velocity: decreased     General Gait Details: fatigue midway requiring PT to cue pt to open eyes and complete ambulation. Close CGA for safety.   Stairs             Wheelchair Mobility    Modified Rankin (Stroke Patients Only)       Balance Overall balance assessment: History of Falls;Needs assistance Sitting-balance support: Feet supported;No upper extremity supported Sitting balance-Leahy Scale: Good     Standing balance support: Bilateral upper extremity supported;Reliant on assistive device for balance;During functional activity Standing balance-Leahy Scale: Poor Standing balance comment: Required close CGA and multiple VC/communication to ensure safety as pt stopped mid-walk and closed eyes due to fatigue.                            Cognition Arousal/Alertness: Awake/alert Behavior During Therapy: WFL for tasks assessed/performed Overall Cognitive Status: Within Functional Limits for tasks assessed  Exercises      General Comments        Pertinent Vitals/Pain Pain Assessment: 0-10 Pain Score: 5  Pain Location: B knees (after ambulation) Pain Descriptors / Indicators: Aching Pain Intervention(s): Limited activity within patient's tolerance;Monitored during session     Home Living                          Prior Function            PT Goals (current goals can now be found in the care plan section) Acute Rehab PT Goals Patient Stated Goal: go home and see my dog    Frequency    Min 2X/week      PT Plan      Co-evaluation              AM-PAC PT "6 Clicks" Mobility   Outcome Measure  Help needed turning from your back to your side while in a flat bed without using bedrails?: A Little Help needed moving from lying on your back to sitting on the side of a flat bed without using bedrails?: A Little Help needed moving to and from a bed to a chair (including a wheelchair)?: A Little Help needed standing up from a chair using your arms (e.g., wheelchair or bedside chair)?: A Little Help needed to walk in hospital room?: A Little Help needed climbing 3-5 steps with a railing? : A Lot 6 Click Score: 17    End of Session Equipment Utilized During Treatment: Gait belt Activity Tolerance: Patient tolerated treatment well;Patient limited by fatigue Patient left: in bed;with call bell/phone within reach;with bed alarm set;with nursing/sitter in room Nurse Communication: Mobility status PT Visit Diagnosis: Muscle weakness (generalized) (M62.81);History of falling (Z91.81);Difficulty in walking, not elsewhere classified (R26.2);Pain Pain - Right/Left: Left Pain - part of body:  (ribs)     Time: 8250-5397 PT Time Calculation (min) (ACUTE ONLY): 19 min  Charges:  $Therapeutic Activity: 8-22 mins                     Patrina Levering PT, DPT 09/08/21 11:03 AM [phone]

## 2021-09-08 NOTE — Consult Note (Addendum)
[place] Nurse wound follow up Refer to previous [place] consult on 11/21; pt was wearing bilat Una boots which were applied 11/21.  Pt is in isolation for Covid. They were removed at some point and Pt did not have compression wraps on this morning.  There are no open wounds or drainage at this time. Reapplied bilat Una boots and coban. Orders provided for bedside nurses to perform as follows: Leave bilat Una boots in place; [place] team will change Q Mon. Julien Girt MSN, RN, [place], [place], [place]

## 2021-09-08 NOTE — Progress Notes (Addendum)
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 male with medical history significant of type 1 diabetes on insulin pump, hypertension, COPD, hypothyroidism, depression, peripheral neuropathy, gastroparesis, mantle cell lymphoma, hypothyroidism, Graves' disease, CHF, s/p of nephrectomy, CAD, CKD-3A, SSS, s/p of pacemaker placement, iron deficiency anemia, lymphedema, who presented with fall.   Pt is a poor historian.  History was limited.  Per report, patient recently lost his wife.  He lives alone at home.  Patient fell, and injured his left chest wall.  Complained of left rib cage pain.  He stated that he couldn't take deep breaths because of pain.     Of note, patient had fall and was seen in ED on 11/14. He had skin laceration in left hand which was sutured up. He had unremarkable CTs of his head, face and cervical spine that time.   Patient was found to have hypoglycemia with blood sugar 19 on presentation.  Insulin pump discontinued.  Pt transitioned to subQ insulin.   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  Insulin pump d/c'ed.   Diabetes coordinator consulted.   Recent A1c 6.9.   Plan: --cont glargine 19u daily --increase mealtime to 4u TID --SSI very sensitive scale --patient to follow up with Dr. Honor Junes to discuss resuming insulin pump if appropriate  after he is discharged from rehab facility.   Chest pain 2/2  Left rib fracture_7th rib Chest pain has resolved.   Trop neg. -Incentive spirometry   Hypercholesterolemia --cont statin   Fall-recurrent -PT/OT, recommend SNF   Iron deficiency anemia  Hemoglobin stable, 13.7 -Continue iron supplement   CKD (chronic kidney disease), stage IIIa Stable.  Baseline creatinine 2.4-2.6   Chronic systolic CHF (congestive heart failure) (HCC)  BNP 198, no respiratory distress.  Not acutely exacerbated --home coreg d/c'ed due to persistent low HR --cont home lasix 40 mg daily   Hypokalemia, resolved   Leukocytosis No source of infection identified.   No fever.  Urinalysis negative.  Likely reactive   HTN (hypertension) BP mostly low normal.   --home coreg d/c'ed due to persistent low HR --cont home lasix 40 mg daily   Hypothyroid -Continue Synthroid   CAD (coronary artery disease): -Continue pravastatin   Skin laceration in left hand: -Wound care consult, wound care per order as above.   Depression Complicated grief Patient lost his wife of 50+  years 1 year ago Per patients sister, he has been very depressed over the past year Was previously on Prozac, which was restarted during this hospitalization.   --cont Prozac 40 mg daily  COVID infection --pos on 11/23.  Not hypoxic, although appeared more dyspneic. --CXR clear --pt reported loosing his taste Plan: --Monitor respiratory status. --no indication to treat currently --supplemental O2 for comfort  Lymphedema and venous stasis ulcer --wound care consulted, placed Unna boots  on 11/21 --Unna boots reapplied today  Poor oral intake --Glucerna BID   DVT prophylaxis: Lovenox SQ Code Status: Full code  Family Communication:  Level of care: Med-Surg Dispo:   The patient is from: home Anticipated d/c is to: SNF Anticipated d/c date is: after 10 days of isolation, 09/13/21  Patient currently is medically  ready to d/c, however, couldn't go to SNF due to new COVID infection.   Subjective and Interval History:  Pt reported breathing improved.  Rib still hurt sometimes.  Didn't want to eat because no taste.    Wound care RN changed unna boots today.   Objective: Vitals:   09/08/21 0030 09/08/21 0518 09/08/21 0808 09/08/21 1205  BP: 117/73 123/86 112/68 108/62  Pulse: (!) 59 71 61 61  Resp: 16 18 16 16   Temp: 97.9 F (36.6 C) (!) 97.3 F (36.3 C) (!) 97.4 F (36.3 C) 98 F (36.7 C)  TempSrc: Oral Oral Oral Oral  SpO2: 100% 96% 98% 100%  Weight:      Height:        Intake/Output Summary (Last 24 hours) at 09/08/2021 1520 Last data filed at 09/08/2021 1350 Gross per 24 hour  Intake 480 ml  Output 900 ml  Net -420 ml   Filed Weights   08/30/21 0413  Weight: 102.1 kg    Examination:   Constitutional: NAD, AAOx3 HEENT: conjunctivae and lids normal, EOMI CV: No cyanosis.   RESP: normal respiratory effort, on RA Extremities: Unna boots on BLE SKIN: warm, dry Neuro: II - XII grossly intact.   Psych: depressed mood and affect.  Appropriate judgement and reason    Data Reviewed: I have personally reviewed following labs and imaging studies  CBC: Recent Labs  Lab 09/03/21 1222 09/07/21 0504  WBC 3.5* 2.9*  HGB 13.3 14.2  HCT 39.3 42.9  MCV 91.2 90.9  PLT 94* 557*   Basic Metabolic Panel: Recent Labs  Lab 09/03/21 1222 09/07/21 0504  NA 131* 135  K 4.8 4.4  CL 98 96*  CO2 25 30  GLUCOSE 278* 126*  BUN 36* 32*  CREATININE 1.39* 1.35*  CALCIUM 8.1* 8.2*  MG 2.0 2.2   GFR: Estimated Creatinine Clearance: 59.3 mL/min (A) (by C-G formula based on SCr of 1.35 mg/dL (H)). Liver Function Tests: No results for input(s): AST, ALT, ALKPHOS, BILITOT, PROT, ALBUMIN in the last 168 hours.  No results for input(s): LIPASE, AMYLASE in the last 168 hours. No results for input(s): AMMONIA in the last 168 hours.  Coagulation Profile: No results for input(s): INR,  PROTIME in the last 168 hours.  Cardiac Enzymes: No results for input(s): CKTOTAL, CKMB, CKMBINDEX, TROPONINI in the last 168 hours. BNP (last 3 results) No results for input(s): PROBNP in the last 8760 hours. HbA1C: No results for input(s): HGBA1C in the last 72 hours. CBG: Recent Labs  Lab 09/07/21 1240 09/07/21 1650 09/07/21 2046 09/08/21 0807 09/08/21 1205  GLUCAP 222* 230* 170* 110* 219*   Lipid Profile: No results for input(s): CHOL, HDL, LDLCALC, TRIG, CHOLHDL, LDLDIRECT in the last 72 hours. Thyroid Function Tests: No results for input(s): TSH, T4TOTAL, FREET4, T3FREE, THYROIDAB in the last 72 hours. Anemia Panel: No results for input(s): VITAMINB12, FOLATE, FERRITIN, TIBC, IRON, RETICCTPCT in the last 72 hours. Sepsis Labs: No results for input(s): PROCALCITON, LATICACIDVEN in the last 168 hours.  Recent Results (from the past 240 hour(s))  Resp Panel by RT-PCR (Flu A&B, Covid) Nasopharyngeal Swab     Status: Abnormal  Collection Time: 09/03/21 12:56 PM   Specimen: Nasopharyngeal Swab; Nasopharyngeal(NP) swabs in vial transport medium  Result Value Ref Range Status   SARS Coronavirus 2 by RT PCR POSITIVE (A) NEGATIVE Final    Comment: RESULT CALLED TO, READ BACK BY AND VERIFIED WITH: EMILY RUEZ 09/03/21 1411 MU (NOTE) SARS-CoV-2 target nucleic acids are DETECTED.  The SARS-CoV-2 RNA is generally detectable in upper respiratory specimens during the acute phase of infection. Positive results are indicative of the presence of the identified virus, but do not rule out bacterial infection or co-infection with other pathogens not detected by the test. Clinical correlation with patient history and other diagnostic information is necessary to determine patient infection status. The expected result is Negative.  Fact Sheet for Patients: EntrepreneurPulse.com.au  Fact Sheet for Healthcare Providers: IncredibleEmployment.be  This  test is not yet approved or cleared by the [place] FDA and  has been authorized for detection and/or diagnosis of SARS-CoV-2 by FDA under an Emergency Use Authorization (EUA).  This EUA will remain in effect (meaning this test can be used)  for the duration of  the COVID-19 declaration under Section 564(b)(1) of the Act, 21 U.S.C. section 360bbb-3(b)(1), unless the authorization is terminated or revoked sooner.     Influenza A by PCR NEGATIVE NEGATIVE Final   Influenza B by PCR NEGATIVE NEGATIVE Final    Comment: (NOTE) The Xpert Xpress SARS-CoV-2/FLU/RSV plus assay is intended as an aid in the diagnosis of influenza from Nasopharyngeal swab specimens and should not be used as a sole basis for treatment. Nasal washings and aspirates are unacceptable for Xpert Xpress SARS-CoV-2/FLU/RSV testing.  Fact Sheet for Patients: EntrepreneurPulse.com.au  Fact Sheet for Healthcare Providers: IncredibleEmployment.be  This test is not yet approved or cleared by the [place] FDA and has been authorized for detection and/or diagnosis of SARS-CoV-2 by FDA under an Emergency Use Authorization (EUA). This EUA will remain in effect (meaning this test can be used) for the duration of the COVID-19 declaration under Section 564(b)(1) of the Act, 21 U.S.C. section 360bbb-3(b)(1), unless the authorization is terminated or revoked.  Performed at [facility] [address] [place], [place] [address]   Resp Panel by RT-PCR (Flu A&B, Covid) Nasopharyngeal Swab     Status: Abnormal   Collection Time: 09/03/21  2:22 PM   Specimen: Nasopharyngeal Swab; Nasopharyngeal(NP) swabs in vial transport medium  Result Value Ref Range Status   SARS Coronavirus 2 by RT PCR POSITIVE (A) NEGATIVE Final    Comment: RESULT CALLED TO, READ BACK BY AND VERIFIED WITH: KRISTAN DAVIS 09/03/21 1551 MU  (NOTE) SARS-CoV-2 target nucleic acids are DETECTED.  The  SARS-CoV-2 RNA is generally detectable in upper respiratory specimens during the acute phase of infection. Positive results are indicative of the presence of the identified virus, but do not rule out bacterial infection or co-infection with other pathogens not detected by the test. Clinical correlation with patient history and other diagnostic information is necessary to determine patient infection status. The expected result is Negative.  Fact Sheet for Patients: EntrepreneurPulse.com.au  Fact Sheet for Healthcare Providers: IncredibleEmployment.be  This test is not yet approved or cleared by the [place] FDA and  has been authorized for detection and/or diagnosis of SARS-CoV-2 by FDA under an Emergency Use Authorization (EUA).  This EUA will remain in effect (meaning this test can be u sed) for the duration of  the COVID-19 declaration under Section 564(b)(1) of the Act, 21 U.S.C. section 360bbb-3(b)(1), unless the  authorization is terminated or revoked sooner.     Influenza A by PCR NEGATIVE NEGATIVE Final   Influenza B by PCR NEGATIVE NEGATIVE Final    Comment: (NOTE) The Xpert Xpress SARS-CoV-2/FLU/RSV plus assay is intended as an aid in the diagnosis of influenza from Nasopharyngeal swab specimens and should not be used as a sole basis for treatment. Nasal washings and aspirates are unacceptable for Xpert Xpress SARS-CoV-2/FLU/RSV testing.  Fact Sheet for Patients: EntrepreneurPulse.com.au  Fact Sheet for Healthcare Providers: IncredibleEmployment.be  This test is not yet approved or cleared by the [place] FDA and has been authorized for detection and/or diagnosis of SARS-CoV-2 by FDA under an Emergency Use Authorization (EUA). This EUA will remain in effect (meaning this test can be used) for the duration of the COVID-19 declaration under Section 564(b)(1) of the Act, 21 U.S.C. section  360bbb-3(b)(1), unless the authorization is terminated or revoked.  Performed at [facility], [address], [place], [place] [address]       Radiology Studies: No results found.   Scheduled Meds:  bacitracin   Topical BID   cholecalciferol  2,000 Units Oral Daily   enoxaparin (LOVENOX) injection  0.5 mg/kg Subcutaneous Q24H   ferrous sulfate  325 mg Oral Q breakfast   FLUoxetine  40 mg Oral Daily   fluticasone  2 spray Each Nare Daily   furosemide  40 mg Oral Daily   insulin aspart  0-6 Units Subcutaneous TID WC   insulin aspart  3 Units Subcutaneous TID WC   insulin glargine-yfgn  19 Units Subcutaneous Daily   levothyroxine  25 mcg Oral QAC breakfast   lidocaine  1 patch Transdermal Q24H   ondansetron (ZOFRAN) IV  4 mg Intravenous Once   pravastatin  40 mg Oral Daily   vitamin B-12  1,000 mcg Oral Daily   Continuous Infusions:   LOS: 10 days     Enzo Bi, MD [facility] If 7PM-7AM, please contact night-coverage 09/08/2021, 3:20 PM

## 2021-09-09 DIAGNOSIS — E162 Hypoglycemia, unspecified: Secondary | ICD-10-CM | POA: Diagnosis not present

## 2021-09-09 LAB — GLUCOSE, CAPILLARY
Glucose-Capillary: 128 mg/dL — ABNORMAL HIGH (ref 70–99)
Glucose-Capillary: 234 mg/dL — ABNORMAL HIGH (ref 70–99)
Glucose-Capillary: 198 mg/dL — ABNORMAL HIGH (ref 70–99)
Glucose-Capillary: 197 mg/dL — ABNORMAL HIGH (ref 70–99)

## 2021-09-09 NOTE — Progress Notes (Signed)
Occupational Therapy Treatment Patient Details Name: Gary [name] MRN: [id number] DOB: Jan 14, 1946 Today's Date: 09/09/2021   History of present illness Pt. is  75 y.o. male who was admitted with hypoglycemia, resulting in a fall. Pt. sustained a  L 7th rib fracture, and multiple lacerations. PMHx includes: DM, HTN, HLD, COPD, GERD, Depression, lymphoma, CHF, PPM.   OT comments  Pt seen for OT treatment on this date. Upon arrival to room, pt awake and seated upright in bed. Pt appears to have decreased self-efficacy stating "I don't think I'll ever get out of here" and initially declining OOB mobility. Pt educated on importance of maintaining home routine (e.g., getting out of bed each day & eating meals regularly) and continuing personal hobbies (e.g., bird watching) while admitted. Pt verbalized understanding and agreeable to sit in recliner for afternoon. Pt currently requires MIN GUARD for bed mobility, MIN A via 1-person hand held assist for stand pivot transfer bed>recliner, and set-up assist for grooming tasks and feeding due to decreased balance, strength, and activity tolerance. Pt reported mild nausea following transfer, which pt reported resolved for the most part by end of session. Pt left in recliner, with chair alarm on, and pt in no acute distress with all needs within reach. RN informed. Pt continues to benefit from skilled OT services to maximize return to PLOF and minimize risk of future falls, injury, caregiver burden, and readmission. Will continue to follow POC. Discharge recommendation remains appropriate.     Recommendations for follow up therapy are one component of a multi-disciplinary discharge planning process, led by the attending physician.  Recommendations may be updated based on patient status, additional functional criteria and insurance authorization.    Follow Up Recommendations  Skilled nursing-short term rehab (<3 hours/day)    Assistance Recommended at Discharge  Frequent or constant Supervision/Assistance  Equipment Recommendations  BSC/3in1;Tub/shower bench       Precautions / Restrictions Precautions Precautions: Fall Restrictions Weight Bearing Restrictions: No       Mobility Bed Mobility Overal bed mobility: Needs Assistance Bed Mobility: Supine to Sit     Supine to sit: Min guard;HOB elevated     General bed mobility comments: Pt endorsed mild dizziness following transfer, which resolved following seated rest break    Transfers Overall transfer level: Needs assistance Equipment used: 1 person hand held assist Transfers: Sit to/from Stand Sit to Stand: Min assist Stand pivot transfers: Min assist         General transfer comment: Requires MIN A via 1-person hand held assist     Balance Overall balance assessment: History of Falls;Needs assistance Sitting-balance support: Feet supported;No upper extremity supported Sitting balance-Leahy Scale: Good Sitting balance - Comments: Good sitting balance reaching within BOS   Standing balance support: Single extremity supported;During functional activity Standing balance-Leahy Scale: Poor Standing balance comment: Pt requires MIN A via 1-person HHA for stand pivot transfer bed>recliner                           ADL either performed or assessed with clinical judgement   ADL Overall ADL's : Needs assistance/impaired Eating/Feeding: Set up;Sitting   Grooming: Wash/dry face;Wash/dry hands;Set up;Sitting                               Functional mobility during ADLs: Minimal assistance (MIN A via HHA)      Extremity/Trunk Assessment Upper Extremity Assessment Upper  Extremity Assessment: Generalized weakness   Lower Extremity Assessment Lower Extremity Assessment: Generalized weakness         Cognition Arousal/Alertness: Awake/alert Behavior During Therapy: WFL for tasks assessed/performed Overall Cognitive Status: Within Functional Limits  for tasks assessed                                 General Comments: Pt appears to have decreased self-efficacy stating "I don't think I'll ever get out of here".          Exercises General Exercises - Lower Extremity Ankle Circles/Pumps: AROM;20 reps;Both;Supine (2x10) Gluteal Sets: Strengthening;Both;10 reps;Supine Hip ABduction/ADduction: AROM;Strengthening;Both;20 reps;Supine (manual resistance; 2 x 10) Straight Leg Raises: AROM;Strengthening;Both;20 reps;Supine (2 x 10) Hip Flexion/Marching: AROM;Strengthening;Both;5 reps;Supine (knee to chest (followed by manually resisted leg press)) Other Exercises Other Exercises: This author educated pt on importance of maintaining home routine (e.g., getting out of bed each day, eating) and continuing personal hobbies (e.g., bird watching) while admitted. Pt verbalized understanding           Pertinent Vitals/ Pain       Pain Assessment: Faces Pain Score: 8  Faces Pain Scale: Hurts even more Pain Location: ribs (w/ cough) Pain Descriptors / Indicators: Aching;Sharp Pain Intervention(s): Limited activity within patient's tolerance;Monitored during session         Frequency  Min 3X/week        Progress Toward Goals  OT Goals(current goals can now be found in the care plan section)  Progress towards OT goals: Progressing toward goals  Acute Rehab OT Goals Patient Stated Goal: to return home OT Goal Formulation: With patient Time For Goal Achievement: 09/14/21 Potential to Achieve Goals: [place] Discharge plan remains appropriate;Frequency remains appropriate       AM-PAC OT "6 Clicks" Daily Activity     Outcome Measure   Help from another person eating meals?: None Help from another person taking care of personal grooming?: A Little Help from another person toileting, which includes using toliet, bedpan, or urinal?: A Lot Help from another person bathing (including washing, rinsing, drying)?: A  Lot Help from another person to put on and taking off regular upper body clothing?: A Little Help from another person to put on and taking off regular lower body clothing?: A Lot 6 Click Score: 16    End of Session    OT Visit Diagnosis: Unsteadiness on feet (R26.81);Other abnormalities of gait and mobility (R26.89);Repeated falls (R29.6)   Activity Tolerance Patient tolerated treatment well   Patient Left in chair;with call bell/phone within reach;with chair alarm set   Nurse Communication Mobility status        Time: 0712-1975 OT Time Calculation (min): 25 min  Charges: OT General Charges $OT Visit: 1 Visit OT Treatments $Self Care/Home Management : 23-37 mins  Fredirick Maudlin, OTR/L [place]

## 2021-09-09 NOTE — Progress Notes (Signed)
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 male with medical history significant of type 1 diabetes on insulin pump, hypertension, COPD, hypothyroidism, depression, peripheral neuropathy, gastroparesis, mantle cell lymphoma, hypothyroidism, Graves' disease, CHF, s/p of nephrectomy, CAD, CKD-3A, SSS, s/p of pacemaker placement, iron deficiency anemia, lymphedema, who presented with fall.   Pt is a poor historian.  History was limited.  Per report, patient recently lost his wife.  He lives alone at home.  Patient fell, and injured his left chest wall.  Complained of left rib cage pain.  He stated that he couldn't take deep breaths because of pain.     Of note, patient had fall and was seen in ED on 11/14. He had skin laceration in left hand which was sutured up. He had unremarkable CTs of his head, face and cervical spine that time.   Patient was found to have hypoglycemia with blood sugar 19 on presentation.  Insulin pump discontinued.  Pt transitioned to subQ insulin.   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  Insulin pump d/c'ed.   Diabetes coordinator consulted.   Recent A1c 6.9.   Plan: --cont glargine 19u daily --cont mealtime to 4u TID --SSI very sensitive scale --patient to follow up with Dr. Honor [name] to discuss resuming insulin pump if appropriate after  he is discharged from rehab facility.   Chest pain 2/2  Left rib fracture_7th rib Chest pain has resolved.   Trop neg. -Incentive spirometry   Hypercholesterolemia --cont statin   Fall-recurrent -PT/OT, recommend SNF   Iron deficiency anemia  Hemoglobin stable, 13.7 -Continue iron supplement   CKD (chronic kidney disease), stage IIIa Stable.  Baseline creatinine 3.8-2.5   Chronic systolic CHF (congestive heart failure) (HCC)  BNP 198, no respiratory distress.  Not acutely exacerbated --home coreg d/c'ed due to persistent low HR --cont home lasix 40 mg daily   Hypokalemia, resolved   Leukocytosis No source of infection identified.   No fever.  Urinalysis negative.  Likely reactive   HTN (hypertension) BP mostly low normal.   --home coreg d/c'ed due to persistent low HR --cont home lasix 40 mg daily   Hypothyroid -Continue Synthroid   CAD (coronary artery disease): -Continue pravastatin   Skin laceration in left hand: -Wound care consult, wound care per order as above.   Depression Complicated grief Patient lost his wife of 50+  years 1 year ago Per patients sister, he has been very depressed over the past year Was previously on Prozac, which was restarted during this hospitalization.   --cont Prozac 40 mg daily  COVID infection --pos on 11/23.  Not hypoxic, although appeared more dyspneic. --CXR clear --pt reported loosing his taste Plan: --Monitor respiratory status. --no indication to treat currently --supplemental O2 for comfort  Lymphedema and venous stasis ulcer --wound care consulted, placed Unna boots  on 11/21 --Unna boots reapplied today  Poor oral intake --Glucerna BID   DVT prophylaxis: Lovenox SQ Code Status: Full code  Family Communication:  Level of care: Med-Surg Dispo:   The patient is from: home Anticipated d/c is to: SNF Anticipated d/c date is: after 10 days of isolation, 09/13/21  Patient currently is medically ready to  d/c, however, couldn't go to SNF due to new COVID infection.   Subjective and Interval History:  Pt reported sore legs after working with OT.  Didn't eat much but drank his Ensure.   Objective: Vitals:   09/08/21 2051 09/09/21 0744 09/09/21 1107 09/09/21 1620  BP: 130/67 100/64 (!) 141/70 124/72  Pulse: 69 97 64 64  Resp: 18 20 18 18   Temp: 97.9 F (36.6 C) (!) 97.5 F (36.4 C) 97.7 F (36.5 C) 97.7 F (36.5 C)  TempSrc: Oral Oral Oral Oral  SpO2: 94% 100% 97% 100%  Weight:      Height:       No intake or output data in the 24 hours ending 09/09/21 1948  Filed Weights   08/30/21 0413  Weight: 102.1 kg    Examination:   Constitutional: NAD, AAOx3 HEENT: conjunctivae and lids normal, EOMI CV: No cyanosis.   RESP: normal respiratory effort, on RA Extremities: Unna boots on BLE SKIN: warm, dry, scabs on his forehead Neuro: II - XII grossly intact.   Psych: depressed mood and affect.  Appropriate judgement and reason   Data Reviewed: I have personally reviewed following labs and imaging studies  CBC: Recent Labs  Lab 09/03/21 1222 09/07/21 0504  WBC 3.5* 2.9*  HGB 13.3 14.2  HCT 39.3 42.9  MCV 91.2 90.9  PLT 94* 595*   Basic Metabolic Panel: Recent Labs  Lab 09/03/21 1222 09/07/21 0504  NA 131* 135  K 4.8 4.4  CL 98 96*  CO2 25 30  GLUCOSE 278* 126*  BUN 36* 32*  CREATININE 1.39* 1.35*  CALCIUM 8.1* 8.2*  MG 2.0 2.2   GFR: Estimated Creatinine Clearance: 59.3 mL/min (A) (by C-G formula based on SCr of 1.35 mg/dL (H)). Liver Function Tests: No results for input(s): AST, ALT, ALKPHOS, BILITOT, PROT, ALBUMIN in the last 168 hours.  No results for input(s): LIPASE, AMYLASE in the last 168 hours. No results for input(s): AMMONIA in the last 168 hours.  Coagulation Profile: No results for input(s): INR, PROTIME in the last 168 hours.  Cardiac Enzymes: No results for input(s): CKTOTAL, CKMB, CKMBINDEX, TROPONINI in the last 168 hours. BNP (last 3  results) No results for input(s): PROBNP in the last 8760 hours. HbA1C: No results for input(s): HGBA1C in the last 72 hours. CBG: Recent Labs  Lab 09/08/21 1734 09/08/21 2052 09/09/21 0743 09/09/21 1158 09/09/21 1620  GLUCAP 263* 147* 128* 234* 198*   Lipid Profile: No results for input(s): CHOL, HDL, LDLCALC, TRIG, CHOLHDL, LDLDIRECT in the last 72 hours. Thyroid Function Tests: No results for input(s): TSH, T4TOTAL, FREET4, T3FREE, THYROIDAB in the last 72 hours. Anemia Panel: No results for input(s): VITAMINB12, FOLATE, FERRITIN, TIBC, IRON, RETICCTPCT in the last 72 hours. Sepsis Labs: No results for input(s): PROCALCITON, LATICACIDVEN in the last 168 hours.  Recent Results (from the past 240 hour(s))  Resp Panel by RT-PCR (Flu A&B, Covid) Nasopharyngeal Swab     Status: Abnormal   Collection Time: 09/03/21 12:56 PM   Specimen: Nasopharyngeal Swab; Nasopharyngeal(NP) swabs in vial transport medium  Result Value Ref Range Status   SARS Coronavirus 2  by RT PCR POSITIVE (A) NEGATIVE Final    Comment: RESULT CALLED TO, READ BACK BY AND VERIFIED WITH: EMILY RUEZ 09/03/21 1411 MU (NOTE) SARS-CoV-2 target nucleic acids are DETECTED.  The SARS-CoV-2 RNA is generally detectable in upper respiratory specimens during the acute phase of infection. Positive results are indicative of the presence of the identified virus, but do not rule out bacterial infection or co-infection with other pathogens not detected by the test. Clinical correlation with patient history and other diagnostic information is necessary to determine patient infection status. The expected result is Negative.  Fact Sheet for Patients: EntrepreneurPulse.com.au  Fact Sheet for Healthcare Providers: IncredibleEmployment.be  This test is not yet approved or cleared by the [place] FDA and  has been authorized for detection and/or diagnosis of SARS-CoV-2 by FDA under an  Emergency Use Authorization (EUA).  This EUA will remain in effect (meaning this test can be used)  for the duration of  the COVID-19 declaration under Section 564(b)(1) of the Act, 21 U.S.C. section 360bbb-3(b)(1), unless the authorization is terminated or revoked sooner.     Influenza A by PCR NEGATIVE NEGATIVE Final   Influenza B by PCR NEGATIVE NEGATIVE Final    Comment: (NOTE) The Xpert Xpress SARS-CoV-2/FLU/RSV plus assay is intended as an aid in the diagnosis of influenza from Nasopharyngeal swab specimens and should not be used as a sole basis for treatment. Nasal washings and aspirates are unacceptable for Xpert Xpress SARS-CoV-2/FLU/RSV testing.  Fact Sheet for Patients: EntrepreneurPulse.com.au  Fact Sheet for Healthcare Providers: IncredibleEmployment.be  This test is not yet approved or cleared by the [place] FDA and has been authorized for detection and/or diagnosis of SARS-CoV-2 by FDA under an Emergency Use Authorization (EUA). This EUA will remain in effect (meaning this test can be used) for the duration of the COVID-19 declaration under Section 564(b)(1) of the Act, 21 U.S.C. section 360bbb-3(b)(1), unless the authorization is terminated or revoked.  Performed at [facility], [address] [place], [place] [address]   Resp Panel by RT-PCR (Flu A&B, Covid) Nasopharyngeal Swab     Status: Abnormal   Collection Time: 09/03/21  2:22 PM   Specimen: Nasopharyngeal Swab; Nasopharyngeal(NP) swabs in vial transport medium  Result Value Ref Range Status   SARS Coronavirus 2 by RT PCR POSITIVE (A) NEGATIVE Final    Comment: RESULT CALLED TO, READ BACK BY AND VERIFIED WITH: KRISTAN DAVIS 09/03/21 1551 MU  (NOTE) SARS-CoV-2 target nucleic acids are DETECTED.  The SARS-CoV-2 RNA is generally detectable in upper respiratory specimens during the acute phase of infection. Positive results are indicative of the  presence of the identified virus, but do not rule out bacterial infection or co-infection with other pathogens not detected by the test. Clinical correlation with patient history and other diagnostic information is necessary to determine patient infection status. The expected result is Negative.  Fact Sheet for Patients: EntrepreneurPulse.com.au  Fact Sheet for Healthcare Providers: IncredibleEmployment.be  This test is not yet approved or cleared by the [place] FDA and  has been authorized for detection and/or diagnosis of SARS-CoV-2 by FDA under an Emergency Use Authorization (EUA).  This EUA will remain in effect (meaning this test can be u sed) for the duration of  the COVID-19 declaration under Section 564(b)(1) of the Act, 21 U.S.C. section 360bbb-3(b)(1), unless the authorization is terminated or revoked sooner.     Influenza A by PCR NEGATIVE NEGATIVE Final   Influenza B by PCR NEGATIVE NEGATIVE Final  Comment: (NOTE) The Xpert Xpress SARS-CoV-2/FLU/RSV plus assay is intended as an aid in the diagnosis of influenza from Nasopharyngeal swab specimens and should not be used as a sole basis for treatment. Nasal washings and aspirates are unacceptable for Xpert Xpress SARS-CoV-2/FLU/RSV testing.  Fact Sheet for Patients: EntrepreneurPulse.com.au  Fact Sheet for Healthcare Providers: IncredibleEmployment.be  This test is not yet approved or cleared by the [place] FDA and has been authorized for detection and/or diagnosis of SARS-CoV-2 by FDA under an Emergency Use Authorization (EUA). This EUA will remain in effect (meaning this test can be used) for the duration of the COVID-19 declaration under Section 564(b)(1) of the Act, 21 U.S.C. section 360bbb-3(b)(1), unless the authorization is terminated or revoked.  Performed at [facility], [address], [place], [place]  [address]       Radiology Studies: No results found.   Scheduled Meds:  bacitracin   Topical BID   cholecalciferol  2,000 Units Oral Daily   enoxaparin (LOVENOX) injection  0.5 mg/kg Subcutaneous Q24H   feeding supplement (GLUCERNA SHAKE)  237 mL Oral BID BM   ferrous sulfate  325 mg Oral Q breakfast   FLUoxetine  40 mg Oral Daily   fluticasone  2 spray Each Nare Daily   furosemide  40 mg Oral Daily   insulin aspart  0-6 Units Subcutaneous TID WC   insulin aspart  4 Units Subcutaneous TID WC   insulin glargine-yfgn  19 Units Subcutaneous Daily   levothyroxine  25 mcg Oral QAC breakfast   lidocaine  1 patch Transdermal Q24H   ondansetron (ZOFRAN) IV  4 mg Intravenous Once   pravastatin  40 mg Oral Daily   vitamin B-12  1,000 mcg Oral Daily   Continuous Infusions:   LOS: 11 days     Enzo Bi, MD [facility] If 7PM-7AM, please contact night-coverage 09/09/2021, 7:48 PM

## 2021-09-09 NOTE — Progress Notes (Signed)
Physical Therapy Treatment Patient Details Name: Gary [name] MRN: [id number] DOB: [birth year] Today's Date: 09/09/2021   History of Present Illness Pt. is  75 y.o. male who was admitted with hypoglycemia, resulting in a fall. Pt. sustained a  L 7th rib fracture, and multiple lacerations. PMHx includes: DM, HTN, HLD, COPD, GERD, Depression, lymphoma, CHF, PPM.    PT Comments    Pt supine in bed upon arrival. He is refusing out of bed mobility this date however is agreeable to supine therex. Overall mood and motivation appeared down compared to yesterday's session. Session was limited and frequent rest breaks taken due to nausea and rib pain (meds administered prior to session). Pt completed bed level LE strengthening exercises. Pt educated on plan to attempt out of bed, functional exercises in upcoming session. Would benefit from skilled PT to address above deficits and promote optimal return to PLOF.   Recommendations for follow up therapy are one component of a multi-disciplinary discharge planning process, led by the attending physician.  Recommendations may be updated based on patient status, additional functional criteria and insurance authorization.  Follow Up Recommendations  Skilled nursing-short term rehab (<3 hours/day)     Assistance Recommended at Discharge Frequent or constant Supervision/Assistance  Equipment Recommendations  Other (comment) (TBD at next venue of care)    Recommendations for Other Services       Precautions / Restrictions Precautions Precautions: Fall Restrictions Weight Bearing Restrictions: No     Mobility  Bed Mobility                    Transfers                        Ambulation/Gait                   Stairs             Wheelchair Mobility    Modified Rankin (Stroke Patients Only)       Balance                                            Cognition Arousal/Alertness:  Awake/alert Behavior During Therapy: WFL for tasks assessed/performed Overall Cognitive Status: Within Functional Limits for tasks assessed                                 General Comments: Overall mood appears to be down; pt stated "I don't think I'll ever get out of here"        Exercises General Exercises - Lower Extremity Ankle Circles/Pumps: AROM;20 reps;Both;Supine (2x10) Gluteal Sets: Strengthening;Both;10 reps;Supine Hip ABduction/ADduction: AROM;Strengthening;Both;20 reps;Supine (manual resistance; 2 x 10) Straight Leg Raises: AROM;Strengthening;Both;20 reps;Supine (2 x 10) Hip Flexion/Marching: AROM;Strengthening;Both;5 reps;Supine (knee to chest (followed by manually resisted leg press)) Other Exercises Other Exercises: LE therex listen above, as well as manually resisted (light) leg press x5 each leg. Reps limited by pt reporting global pain/discomfort and nausea.    General Comments        Pertinent Vitals/Pain Pain Score: 8  Faces Pain Scale: Hurts whole lot Pain Location: ribs (w/ cough) Pain Descriptors / Indicators: Aching;[place]  Prior Function            PT Goals (current goals can now be found in the care plan section) Acute Rehab PT Goals Patient Stated Goal: go home and see my dog    Frequency    Min 2X/week      PT Plan      Co-evaluation              AM-PAC PT "6 Clicks" Mobility   Outcome Measure  Help needed turning from your back to your side while in a flat bed without using bedrails?: A Little Help needed moving from lying on your back to sitting on the side of a flat bed without using bedrails?: A Little Help needed moving to and from a bed to a chair (including a wheelchair)?: A Little Help needed standing up from a chair using your arms (e.g., wheelchair or bedside chair)?: A Little Help needed to walk in hospital room?: A Little Help needed climbing 3-5  steps with a railing? : A Lot 6 Click Score: 17    End of Session   Activity Tolerance: Patient limited by fatigue;Patient limited by pain Patient left: in bed;with call bell/phone within reach;with bed alarm set Nurse Communication: Mobility status PT Visit Diagnosis: Muscle weakness (generalized) (M62.81);History of falling (Z91.81);Difficulty in walking, not elsewhere classified (R26.2);Pain Pain - Right/Left: Left Pain - part of body:  (ribs)     Time: 2924-4628 PT Time Calculation (min) (ACUTE ONLY): 27 min  Charges:  $Therapeutic Exercise: 23-37 mins                     Patrina Levering PT, DPT 09/09/21 12:31 PM [phone]

## 2021-09-10 ENCOUNTER — Other Ambulatory Visit: Payer: Self-pay | Admitting: Internal Medicine

## 2021-09-10 ENCOUNTER — Telehealth: Payer: Medicare Other

## 2021-09-10 ENCOUNTER — Encounter (INDEPENDENT_AMBULATORY_CARE_PROVIDER_SITE_OTHER): Payer: Medicare Other

## 2021-09-10 DIAGNOSIS — I110 Hypertensive heart disease with heart failure: Secondary | ICD-10-CM | POA: Diagnosis not present

## 2021-09-10 DIAGNOSIS — E1122 Type 2 diabetes mellitus with diabetic chronic kidney disease: Secondary | ICD-10-CM

## 2021-09-10 DIAGNOSIS — E039 Hypothyroidism, unspecified: Secondary | ICD-10-CM

## 2021-09-10 DIAGNOSIS — E785 Hyperlipidemia, unspecified: Secondary | ICD-10-CM

## 2021-09-10 DIAGNOSIS — N189 Chronic kidney disease, unspecified: Secondary | ICD-10-CM | POA: Diagnosis not present

## 2021-09-10 DIAGNOSIS — E162 Hypoglycemia, unspecified: Secondary | ICD-10-CM | POA: Diagnosis not present

## 2021-09-10 LAB — GLUCOSE, CAPILLARY
Glucose-Capillary: 184 mg/dL — ABNORMAL HIGH (ref 70–99)
Glucose-Capillary: 286 mg/dL — ABNORMAL HIGH (ref 70–99)
Glucose-Capillary: 287 mg/dL — ABNORMAL HIGH (ref 70–99)

## 2021-09-10 LAB — RESP PANEL BY RT-PCR (FLU A&B, COVID) ARPGX2
Influenza A by PCR: NEGATIVE
Influenza B by PCR: NEGATIVE
SARS Coronavirus 2 by RT PCR: POSITIVE — AB

## 2021-09-10 NOTE — Progress Notes (Signed)
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 male with medical history significant of type 1 diabetes on insulin pump, hypertension, COPD, hypothyroidism, depression, peripheral neuropathy, gastroparesis, mantle cell lymphoma, hypothyroidism, Graves' disease, CHF, s/p of nephrectomy, CAD, CKD-3A, SSS, s/p of pacemaker placement, iron deficiency anemia, lymphedema, who presented with fall.   Pt is a poor historian.  History was limited.  Per report, patient recently lost his wife.  He lives alone at home.  Patient fell, and injured his left chest wall.  Complained of left rib cage pain.  He stated that he couldn't take deep breaths because of pain.     Of note, patient had fall and was seen in ED on 11/14. He had skin laceration in left hand which was sutured up. He had unremarkable CTs of his head, face and cervical spine that time.   Patient was found to have hypoglycemia with blood sugar 19 on presentation.  Insulin pump discontinued.  Pt transitioned to subQ insulin.   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  Insulin pump d/c'ed.   Diabetes coordinator consulted.   Recent A1c 6.9.   --pt has required frequent adjustment of insulin regimen due to highs and lows and inconsistent oral intake. Plan: --cont glargine 19u daily --increase mealtime to  6u BID --SSI very sensitive scale --patient to follow up with Dr. Honor Junes to discuss resuming insulin pump if appropriate after he is discharged from rehab facility.   Chest pain 2/2  Left rib fracture_7th rib Chest pain has resolved.   Trop neg. -Incentive spirometry   Hypercholesterolemia --cont statin   Fall-recurrent -PT/OT, recommend SNF   Iron deficiency anemia  Hemoglobin stable, 13.7 -Continue iron supplement   CKD (chronic kidney disease), stage IIIa Stable.  Baseline creatinine 4.0-3.4   Chronic systolic CHF (congestive heart failure) (HCC)  BNP 198, no respiratory distress.  Not acutely exacerbated --home coreg d/c'ed due to persistent low HR --cont home lasix 40 mg daily   Hypokalemia, resolved   Leukocytosis No source of infection identified.   No fever.  Urinalysis negative.  Likely reactive   HTN (hypertension) BP mostly low normal.   --home coreg d/c'ed due to persistent low HR --hold home lasix due to soft BP   Hypothyroid -Continue Synthroid   CAD (coronary artery disease): -Continue pravastatin   Skin laceration in left hand: -Wound care consult, wound care per order as above. --RN to remove sutures   Depression Complicated grief Patient lost his wife of 50+  years 1 year ago Per patients sister, he has been very depressed over the past year Was previously on Prozac, which was restarted during this hospitalization.   --cont Prozac 40 mg daily  COVID infection --pos on 11/23.  Not hypoxic, although appeared more dyspneic. --CXR clear --pt reported loosing his taste Plan: --  Monitor respiratory status. --no indication to treat currently --supplemental O2 for comfort  Lymphedema and venous stasis ulcer --wound care consulted, placed Unna boots on 11/21 --weekly exchange by wound care RN  Poor oral intake --Glucerna BID   DVT prophylaxis: Lovenox SQ Code Status: Full code  Family Communication:  Level of care:  Med-Surg Dispo:   The patient is from: home Anticipated d/c is to: SNF Anticipated d/c date is: after 10 days of isolation, 09/13/21  Patient currently is medically ready to d/c, however, couldn't go to SNF due to new COVID infection.   Subjective and Interval History:  Pt reported doing "fair" (which was the best description pt had used so far with me).  Ate better, but still having no taste.  Having BM's.  SNF asked for repeat COVID, and was willing to take pt sooner if neg, but unfortunately, it is still pos.   Objective: Vitals:   09/10/21 0825 09/10/21 1225 09/10/21 1658 09/10/21 2051  BP: 110/62 105/62 116/69 119/73  Pulse: 72 74 74 72  Resp: 16 16 16 16   Temp: 97.8 F (36.6 C) 97.8 F (36.6 C) 97.9 F (36.6 C) 98.1 F (36.7 C)  TempSrc: Oral Oral Oral Oral  SpO2: 97% 100% 97% 95%  Weight:      Height:       No intake or output data in the 24 hours ending 09/10/21 2135  Filed Weights   08/30/21 0413  Weight: 102.1 kg    Examination:   Constitutional: NAD, AAOx3 HEENT: conjunctivae and lids normal, EOMI CV: No cyanosis.   RESP: normal respiratory effort, on RA Extremities: Unna boots on BLE SKIN: warm, dry, extensive bruises resolving Neuro: II - XII grossly intact.   Psych: depressed mood and affect.  Appropriate judgement and reason   Data Reviewed: I have personally reviewed following labs and imaging studies  CBC: Recent Labs  Lab 09/07/21 0504  WBC 2.9*  HGB 14.2  HCT 42.9  MCV 90.9  PLT 188*   Basic Metabolic Panel: Recent Labs  Lab 09/07/21 0504  NA 135  K 4.4  CL 96*  CO2 30  GLUCOSE 126*  BUN 32*  CREATININE 1.35*  CALCIUM 8.2*  MG 2.2   GFR: Estimated Creatinine Clearance: 59.3 mL/min (A) (by C-G formula based on SCr of 1.35 mg/dL (H)). Liver Function Tests: No results for input(s): AST, ALT, ALKPHOS, BILITOT, PROT, ALBUMIN in the last 168 hours.  No results for input(s): LIPASE, AMYLASE in the last 168 hours. No results  for input(s): AMMONIA in the last 168 hours.  Coagulation Profile: No results for input(s): INR, PROTIME in the last 168 hours.  Cardiac Enzymes: No results for input(s): CKTOTAL, CKMB, CKMBINDEX, TROPONINI in the last 168 hours. BNP (last 3 results) No results for input(s): PROBNP in the last 8760 hours. HbA1C: No results for input(s): HGBA1C in the last 72 hours. CBG: Recent Labs  Lab 09/09/21 1620 09/09/21 2001 09/10/21 0826 09/10/21 1224 09/10/21 1659  GLUCAP 198* 197* 184* 286* 287*   Lipid Profile: No results for input(s): CHOL, HDL, LDLCALC, TRIG, CHOLHDL, LDLDIRECT in the last 72 hours. Thyroid Function Tests: No results for input(s): TSH, T4TOTAL, FREET4, T3FREE, THYROIDAB in the last 72 hours. Anemia Panel: No results for input(s): VITAMINB12, FOLATE, FERRITIN, TIBC, IRON, RETICCTPCT in the last 72 hours. Sepsis Labs: No results for input(s): PROCALCITON, LATICACIDVEN in the last 168 hours.  Recent Results (from the past 240 hour(s))  Resp Panel by RT-PCR (Flu A&B, Covid)  Nasopharyngeal Swab     Status: Abnormal   Collection Time: 09/03/21 12:56 PM   Specimen: Nasopharyngeal Swab; Nasopharyngeal(NP) swabs in vial transport medium  Result Value Ref Range Status   SARS Coronavirus 2 by RT PCR POSITIVE (A) NEGATIVE Final    Comment: RESULT CALLED TO, READ BACK BY AND VERIFIED WITH: EMILY RUEZ 09/03/21 1411 MU (NOTE) SARS-CoV-2 target nucleic acids are DETECTED.  The SARS-CoV-2 RNA is generally detectable in upper respiratory specimens during the acute phase of infection. Positive results are indicative of the presence of the identified virus, but do not rule out bacterial infection or co-infection with other pathogens not detected by the test. Clinical correlation with patient history and other diagnostic information is necessary to determine patient infection status. The expected result is Negative.  Fact Sheet for  Patients: EntrepreneurPulse.com.au  Fact Sheet for Healthcare Providers: IncredibleEmployment.be  This test is not yet approved or cleared by the [place] FDA and  has been authorized for detection and/or diagnosis of SARS-CoV-2 by FDA under an Emergency Use Authorization (EUA).  This EUA will remain in effect (meaning this test can be used)  for the duration of  the COVID-19 declaration under Section 564(b)(1) of the Act, 21 U.S.C. section 360bbb-3(b)(1), unless the authorization is terminated or revoked sooner.     Influenza A by PCR NEGATIVE NEGATIVE Final   Influenza B by PCR NEGATIVE NEGATIVE Final    Comment: (NOTE) The Xpert Xpress SARS-CoV-2/FLU/RSV plus assay is intended as an aid in the diagnosis of influenza from Nasopharyngeal swab specimens and should not be used as a sole basis for treatment. Nasal washings and aspirates are unacceptable for Xpert Xpress SARS-CoV-2/FLU/RSV testing.  Fact Sheet for Patients: EntrepreneurPulse.com.au  Fact Sheet for Healthcare Providers: IncredibleEmployment.be  This test is not yet approved or cleared by the [place] FDA and has been authorized for detection and/or diagnosis of SARS-CoV-2 by FDA under an Emergency Use Authorization (EUA). This EUA will remain in effect (meaning this test can be used) for the duration of the COVID-19 declaration under Section 564(b)(1) of the Act, 21 U.S.C. section 360bbb-3(b)(1), unless the authorization is terminated or revoked.  Performed at [facility], [address] [place], [place] [address]   Resp Panel by RT-PCR (Flu A&B, Covid) Nasopharyngeal Swab     Status: Abnormal   Collection Time: 09/03/21  2:22 PM   Specimen: Nasopharyngeal Swab; Nasopharyngeal(NP) swabs in vial transport medium  Result Value Ref Range Status   SARS Coronavirus 2 by RT PCR POSITIVE (A) NEGATIVE Final    Comment: RESULT  CALLED TO, READ BACK BY AND VERIFIED WITH: KRISTAN DAVIS 09/03/21 1551 MU  (NOTE) SARS-CoV-2 target nucleic acids are DETECTED.  The SARS-CoV-2 RNA is generally detectable in upper respiratory specimens during the acute phase of infection. Positive results are indicative of the presence of the identified virus, but do not rule out bacterial infection or co-infection with other pathogens not detected by the test. Clinical correlation with patient history and other diagnostic information is necessary to determine patient infection status. The expected result is Negative.  Fact Sheet for Patients: EntrepreneurPulse.com.au  Fact Sheet for Healthcare Providers: IncredibleEmployment.be  This test is not yet approved or cleared by the [place] FDA and  has been authorized for detection and/or diagnosis of SARS-CoV-2 by FDA under an Emergency Use Authorization (EUA).  This EUA will remain in effect (meaning this test can be u sed) for the duration of  the COVID-19 declaration under Section  564(b)(1) of the Act, 21 U.S.C. section 360bbb-3(b)(1), unless the authorization is terminated or revoked sooner.     Influenza A by PCR NEGATIVE NEGATIVE Final   Influenza B by PCR NEGATIVE NEGATIVE Final    Comment: (NOTE) The Xpert Xpress SARS-CoV-2/FLU/RSV plus assay is intended as an aid in the diagnosis of influenza from Nasopharyngeal swab specimens and should not be used as a sole basis for treatment. Nasal washings and aspirates are unacceptable for Xpert Xpress SARS-CoV-2/FLU/RSV testing.  Fact Sheet for Patients: EntrepreneurPulse.com.au  Fact Sheet for Healthcare Providers: IncredibleEmployment.be  This test is not yet approved or cleared by the [place] FDA and has been authorized for detection and/or diagnosis of SARS-CoV-2 by FDA under an Emergency Use Authorization (EUA). This EUA will remain in  effect (meaning this test can be used) for the duration of the COVID-19 declaration under Section 564(b)(1) of the Act, 21 U.S.C. section 360bbb-3(b)(1), unless the authorization is terminated or revoked.  Performed at [facility], [address] [place], [place] [address]   Resp Panel by RT-PCR (Flu A&B, Covid) Nasopharyngeal Swab     Status: Abnormal   Collection Time: 09/10/21  4:00 PM   Specimen: Nasopharyngeal Swab; Nasopharyngeal(NP) swabs in vial transport medium  Result Value Ref Range Status   SARS Coronavirus 2 by RT PCR POSITIVE (A) NEGATIVE Final    Comment: RESULT CALLED TO, READ BACK BY AND VERIFIED WITH: J DORORTHY @ 6063 09/10/21 LFD (NOTE) SARS-CoV-2 target nucleic acids are DETECTED.  The SARS-CoV-2 RNA is generally detectable in upper respiratory specimens during the acute phase of infection. Positive results are indicative of the presence of the identified virus, but do not rule out bacterial infection or co-infection with other pathogens not detected by the test. Clinical correlation with patient history and other diagnostic information is necessary to determine patient infection status. The expected result is Negative.  Fact Sheet for Patients: EntrepreneurPulse.com.au  Fact Sheet for Healthcare Providers: IncredibleEmployment.be  This test is not yet approved or cleared by the [place] FDA and  has been authorized for detection and/or diagnosis of SARS-CoV-2 by FDA under an Emergency Use Authorization (EUA).  This EUA will remain in effect (meaning this test can be [place] ed) for the duration of  the COVID-19 declaration under Section 564(b)(1) of the Act, 21 U.S.C. section 360bbb-3(b)(1), unless the authorization is terminated or revoked sooner.     Influenza A by PCR NEGATIVE NEGATIVE Final   Influenza B by PCR NEGATIVE NEGATIVE Final    Comment: (NOTE) The Xpert Xpress SARS-CoV-2/FLU/RSV plus assay  is intended as an aid in the diagnosis of influenza from Nasopharyngeal swab specimens and should not be used as a sole basis for treatment. Nasal washings and aspirates are unacceptable for Xpert Xpress SARS-CoV-2/FLU/RSV testing.  Fact Sheet for Patients: EntrepreneurPulse.com.au  Fact Sheet for Healthcare Providers: IncredibleEmployment.be  This test is not yet approved or cleared by the [place] FDA and has been authorized for detection and/or diagnosis of SARS-CoV-2 by FDA under an Emergency Use Authorization (EUA). This EUA will remain in effect (meaning this test can be used) for the duration of the COVID-19 declaration under Section 564(b)(1) of the Act, 21 U.S.C. section 360bbb-3(b)(1), unless the authorization is terminated or revoked.  Performed at [facility], [address], [place], [place] [address]       Radiology Studies: No results found.   Scheduled Meds:  bacitracin   Topical BID   cholecalciferol  2,000 Units Oral  Daily   enoxaparin (LOVENOX) injection  0.5 mg/kg Subcutaneous Q24H   feeding supplement (GLUCERNA SHAKE)  237 mL Oral BID BM   ferrous sulfate  325 mg Oral Q breakfast   FLUoxetine  40 mg Oral Daily   fluticasone  2 spray Each Nare Daily   furosemide  40 mg Oral Daily   insulin aspart  0-6 Units Subcutaneous TID WC   insulin aspart  4 Units Subcutaneous TID WC   insulin glargine-yfgn  19 Units Subcutaneous Daily   levothyroxine  25 mcg Oral QAC breakfast   lidocaine  1 patch Transdermal Q24H   ondansetron (ZOFRAN) IV  4 mg Intravenous Once   pravastatin  40 mg Oral Daily   vitamin B-12  1,000 mcg Oral Daily   Continuous Infusions:   LOS: 12 days     Enzo Bi, MD [facility] If 7PM-7AM, please contact night-coverage 09/10/2021, 9:35 PM

## 2021-09-10 NOTE — Progress Notes (Signed)
Physical Therapy Treatment Patient Details Name: Gary [name] MRN: [id number] DOB: [birth year] Today's Date: 09/10/2021   History of Present Illness Pt. is  75 y.o. male who was admitted with hypoglycemia, resulting in a fall. Pt. sustained a  L 7th rib fracture, and multiple lacerations. PMHx includes: DM, HTN, HLD, COPD, GERD, Depression, lymphoma, CHF, PPM.    PT Comments    Pt received supine in bed, requires motivation to participate with therapy. He did agree to OOB mobility this date. Assist levels remain the same. Ambulation distance increased to 77ft and pt was able to void while standing at the toilet. Pt reported multiple times that it "felt good to use the actual toilet." Pt ended session sitting up in recliner. Overall disposition seems to have improved by end of session as pt smiled, laughed and actively engaged in conversation. He does continue to demo limited functional mobility due to weakness and pain; endurance also remains limited. Would benefit from skilled PT to address above deficits and promote optimal return to PLOF.   Recommendations for follow up therapy are one component of a multi-disciplinary discharge planning process, led by the attending physician.  Recommendations may be updated based on patient status, additional functional criteria and insurance authorization.  Follow Up Recommendations  Skilled nursing-short term rehab (<3 hours/day)     Assistance Recommended at Discharge Frequent or constant Supervision/Assistance  Equipment Recommendations  Other (comment) (TBD at next venue of care)    Recommendations for Other Services       Precautions / Restrictions Precautions Precautions: Fall Restrictions Weight Bearing Restrictions: No     Mobility  Bed Mobility Overal bed mobility: Needs Assistance Bed Mobility: Supine to Sit     Supine to sit: Min guard;HOB elevated     General bed mobility comments: CGA for safety with pt reporting mild  dizziness.    Transfers Overall transfer level: Needs assistance Equipment used: Rolling walker (2 wheels) Transfers: Sit to/from Stand;Bed to chair/wheelchair/BSC Sit to Stand: Mod assist     Step pivot transfers: Min assist     General transfer comment: MOD A to lift from low surface. MIN A during step pivot transfer to steady and guide RW as pt attempted to hold RW with one hand and bed rail with one hand.    Ambulation/Gait Ambulation/Gait assistance: Min guard Gait Distance (Feet): 40 Feet Assistive device: Rolling walker (2 wheels) Gait Pattern/deviations: Decreased stride length;Trunk flexed Gait velocity: decreased     General Gait Details: 79ft>standing void in bathroom (2 minutes)>78ft. Fatigue reported multiple times throughout ambulation. Kyphotic posture. ER of bilateral feet. Vc to remain within RW during ambulation.   Stairs             Wheelchair Mobility    Modified Rankin (Stroke Patients Only)       Balance Overall balance assessment: History of Falls;Needs assistance Sitting-balance support: Feet supported;No upper extremity supported Sitting balance-Leahy Scale: Fair Sitting balance - Comments: Improved from F to G without UE support. Initially demo rightward lean.   Standing balance support: Single extremity supported;During functional activity;Reliant on assistive device for balance Standing balance-Leahy Scale: Poor Standing balance comment: Requires UE support and steadying assist to maintain standing balance                            Cognition Arousal/Alertness: Awake/alert Behavior During Therapy: WFL for tasks assessed/performed Overall Cognitive Status: Within Functional Limits for tasks assessed  General Comments: Initially self-limiting with decreased motivation, however agreeable. Pt smiled and laughed at end of session.        Exercises Other Exercises Other  Exercises: supine>sit, STS, 12ft amb>standing void (2 minutes)>39ft ambulation, seated rest break, stand pivot transfer to recliner.    General Comments        Pertinent Vitals/Pain Pain Assessment: Faces Faces Pain Scale: Hurts even more Pain Location: ribs (w/ cough) Pain Descriptors / Indicators: Aching;Sharp Pain Intervention(s): Limited activity within patient's tolerance;Monitored during session;Repositioned    Home Living                          Prior Function            PT Goals (current goals can now be found in the care plan section) Acute Rehab PT Goals Patient Stated Goal: go home and see my dog    Frequency    Min 2X/week      PT Plan      Co-evaluation              AM-PAC PT "6 Clicks" Mobility   Outcome Measure  Help needed turning from your back to your side while in a flat bed without using bedrails?: A Little Help needed moving from lying on your back to sitting on the side of a flat bed without using bedrails?: A Little Help needed moving to and from a bed to a chair (including a wheelchair)?: A Little Help needed standing up from a chair using your arms (e.g., wheelchair or bedside chair)?: A Little Help needed to walk in hospital room?: A Little Help needed climbing 3-5 steps with a railing? : A Lot 6 Click Score: 17    End of Session Equipment Utilized During Treatment: Gait belt Activity Tolerance: Patient limited by fatigue;Patient limited by pain Patient left: with call bell/phone within reach;in chair;with chair alarm set Nurse Communication: Mobility status PT Visit Diagnosis: Muscle weakness (generalized) (M62.81);History of falling (Z91.81);Difficulty in walking, not elsewhere classified (R26.2);Pain Pain - Right/Left: Left Pain - part of body:  (rib)     Time: 0981-1914 PT Time Calculation (min) (ACUTE ONLY): 24 min  Charges:  $Gait Training: 8-22 mins $Therapeutic Activity: 8-22 mins                      Patrina Levering PT, DPT 09/10/21 1:29 PM [phone]

## 2021-09-11 DIAGNOSIS — E162 Hypoglycemia, unspecified: Secondary | ICD-10-CM | POA: Diagnosis not present

## 2021-09-11 LAB — GLUCOSE, CAPILLARY
Glucose-Capillary: 233 mg/dL — ABNORMAL HIGH (ref 70–99)
Glucose-Capillary: 342 mg/dL — ABNORMAL HIGH (ref 70–99)
Glucose-Capillary: 301 mg/dL — ABNORMAL HIGH (ref 70–99)
Glucose-Capillary: 119 mg/dL — ABNORMAL HIGH (ref 70–99)

## 2021-09-11 MED ORDER — INSULIN ASPART 100 UNIT/ML IJ SOLN
6.0000 [IU] | Freq: Three times a day (TID) | INTRAMUSCULAR | Status: DC
  Administered 2021-09-11 – 2021-09-13 (×8): 6 [IU] via SUBCUTANEOUS
  Filled 2021-09-11 (×8): qty 1

## 2021-09-11 NOTE — Progress Notes (Signed)
PROGRESS NOTE  Gary [name] ZOX:[id number] DOB: 07-11-1946 DOA: 08/29/2021 PCP: [name], MD  Brief History   Gary [name] is a 75 y.o. male with medical history significant of type 1 diabetes on insulin pump, hypertension, COPD, hypothyroidism, depression, peripheral neuropathy, gastroparesis, mantle cell lymphoma, hypothyroidism, Graves' disease, CHF, s/p of nephrectomy, CAD, CKD-3A, SSS, s/p of pacemaker placement, iron deficiency anemia, lymphedema, who presented with fall.   Pt is a poor historian.  History was limited.  Per report, patient recently lost his wife.  He lives alone at home.  Patient fell, and injured his left chest wall.  Complained of left rib cage pain.  He stated that he couldn't take deep breaths because of pain.     Of note, patient had fall and was seen in ED on 11/14. He had skin laceration in left hand which was sutured up. He had unremarkable CTs of his head, face and cervical spine that time.   Patient was found to have hypoglycemia with blood sugar 19 on presentation.  Insulin pump discontinued.  Pt transitioned to subQ insulin.  CT of the chest demonstrated an acute minimally displaced fracture of the left seventh rib is seen laterally. There were also multiple healed bilateral rib fractures noted.  [facility] hospitalists were consulted to admit the patient for further evaluation and care.  Hypoglycemia was treated with D50 and D10 gtt. The patient's insulin pump was discontinued and the diabetic coordinator was consulted. The patient was placed on glargine 19u daily with 4u of fast acting insulin at mealtime tid with sensitive scale SSI. Issue of insulin pump may be taken up by patient with Dr. Honor Junes to discuss its appropriateness going forward after the patient is discharged from rehab. Chest pain has resolved.   The patient has been evaluated by PT/OT. They have recommended SNF. The patient has been accepted for a bed at [facility] on Saturday.   Consultants   Wound care  Procedures  None  Antibiotics   Anti-infectives (From admission, onward)    None      Subjective  The patient is resting comfortably. No new complaints.  Objective   Vitals:  Vitals:   09/11/21 0825 09/11/21 1151  BP: 112/66 (!) 151/71  Pulse: 68 84  Resp: 16 18  Temp: (!) 97.4 F (36.3 C) (!) 97.4 F (36.3 C)  SpO2: 99% 100%    Exam:  Constitutional:  The patient is awake, alert, and oriented x 3. No acute distress. Respiratory:  No increased work of breathing. No wheezes, rales, or rhonchi No tactile fremitus Cardiovascular:  Regular rate and rhythm No murmurs, ectopy, or gallups. No lateral PMI. No thrills. Abdomen:  Abdomen is soft, non-tender, non-distended No hernias, masses, or organomegaly Normoactive bowel sounds.  Musculoskeletal:  No cyanosis, clubbing, or edema Skin:  No rashes, lesions, ulcers palpation of skin: no induration or nodules Neurologic:  CN 2-12 intact Sensation all 4 extremities intact Psychiatric:  Mental status Mood, affect appropriate Orientation to person, place, time  judgment and insight appear intact   I have personally reviewed the following:   Today's Data  Vitals  Lab Data  CBC, BMP  Imaging  CT C- spine CT Chest CT head CT maxillofacial without contrast  Cardiology Data  EKG  Scheduled Meds:  bacitracin   Topical BID   cholecalciferol  2,000 Units Oral Daily   enoxaparin (LOVENOX) injection  0.5 mg/kg Subcutaneous Q24H   feeding supplement (GLUCERNA SHAKE)  237 mL Oral BID BM  ferrous sulfate  325 mg Oral Q breakfast   FLUoxetine  40 mg Oral Daily   fluticasone  2 spray Each Nare Daily   insulin aspart  0-6 Units Subcutaneous TID WC   insulin aspart  6 Units Subcutaneous TID WC   insulin glargine-yfgn  19 Units Subcutaneous Daily   levothyroxine  25 mcg Oral QAC breakfast   lidocaine  1 patch Transdermal Q24H   ondansetron (ZOFRAN) IV  4 mg Intravenous Once   pravastatin  40  mg Oral Daily   vitamin B-12  1,000 mcg Oral Daily   Principal Problem:   Hypoglycemia Active Problems:   Chest pain   Hypercholesterolemia   Fall   Iron deficiency anemia   CKD (chronic kidney disease), stage IIIa   Chronic systolic CHF (congestive heart failure) (HCC)   Hypokalemia   Recurrent falls   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   Left rib fracture_7th rib   Leukocytosis   HTN (hypertension)   Hypothyroid   CAD (coronary artery disease)   LOS: 13 days   A & P   Severe symptomatic hypoglycemia 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 On presentation, Patient was treated with D50 and D10 gtt.  Insulin pump d/c'ed.   Diabetes coordinator consulted.   Recent A1c 6.9.   --pt has required frequent adjustment of insulin regimen due to highs and lows and inconsistent oral intake. Plan: --cont glargine 19u daily --increase mealtime to 6u BID --SSI very sensitive scale --patient to follow up with Dr. Honor Junes to discuss resuming insulin pump if appropriate after he is discharged from rehab facility.   Chest pain 2/2  Left rib fracture_7th rib Chest pain has resolved.   Trop neg. -Incentive spirometry   Hypercholesterolemia --cont statin   Fall-recurrent -PT/OT, recommend SNF   Iron deficiency anemia  Hemoglobin stable, 13.7 -Continue iron supplement CKD (chronic kidney disease), stage IIIa Stable.  Baseline creatinine 4.0-9.8 Chronic systolic CHF (congestive heart failure) (HCC)  BNP 198, no respiratory distress.  Not acutely exacerbated --home coreg d/c'ed due to persistent low HR --cont home lasix 40 mg daily Hypokalemia, resolved   Leukocytosis No source of infection identified.   No fever.  Urinalysis negative.  Likely reactive   HTN (hypertension) BP mostly low normal.   --home coreg d/c'ed due to persistent low HR --hold home lasix due to soft BP   Hypothyroid -Continue Synthroid   CAD (coronary artery  disease): -Continue pravastatin   Skin laceration in left hand: -Wound care consult, wound care per order as above. --RN to remove sutures   Depression Complicated grief Patient lost his wife of 50+  years 1 year ago Per patients sister, he has been very depressed over the past year Was previously on Prozac, which was restarted during this hospitalization.   --cont Prozac 40 mg daily   COVID infection --pos on 11/23.  Not hypoxic, although appeared more dyspneic. --CXR clear --pt reported loosing his taste Plan: --Monitor respiratory status. --no indication to treat currently --supplemental O2 for comfort   Lymphedema and venous stasis ulcer --wound care consulted, placed Unna boots on 11/21 --weekly exchange by wound care RN   Poor oral intake --Glucerna BID     DVT prophylaxis: Lovenox SQ Code Status: Full code  Family Communication:  Level of care: Med-Surg Dispo:   The patient is from: home Anticipated d/c is to: SNF Anticipated d/c date is: Saturday 09/13/2021   Patient currently is medically ready to d/c, however must wait  until 09/13/2021 to discharge to SNF.   I have seen and examined this patient myself. I have spent 32 minutes in his evaluation and care.  DVT prophylaxis: Lovenox Code Status: Full Code Family Communication: None available Disposition Plan: Plan is to discharge to SNF on Saturday, 09/13/2021    Gary Roughton, DO [facility] Direct contact: see [email or web address]  7PM-7AM contact night coverage as above 09/11/2021, 4:24 PM  LOS: 13 days

## 2021-09-11 NOTE — TOC Progression Note (Signed)
Transition of Care [facility]) - Progression Note    Patient Details  Name: Gary [name] MRN: [id number] Date of Birth: 03-30-1946  Transition of Care [facility] CM/SW Contact  Shelbie Hutching, RN Phone Number: 09/11/2021, 2:44 PM  Clinical Narrative:    RNCM spoke with Otila Kluver over at [facility], she talked with the administrator and they cannot accept before Saturday but they can accept Saturday.     Expected Discharge Plan: [place] Barriers to Discharge: Continued Medical Work up  Expected Discharge Plan and Services Expected Discharge Plan: [place] In-house Referral: Clinical Social Work Discharge Planning Services: CM Consult Post Acute Care Choice: [place] Living arrangements for the past 2 months: Single Family Home Expected Discharge Date: 09/03/21               DME Arranged: N/A DME Agency: NA       HH Arranged: NA HH Agency: NA         Social Determinants of Health (SDOH) Interventions    Readmission Risk Interventions Readmission Risk Prevention Plan 08/30/2021  Transportation Screening Complete  PCP or Specialist Appt within 3-5 Days Complete  HRI or [place] Complete  Social Work Consult for [place] Planning/Counseling Complete  Palliative Care Screening Not Applicable  Medication Review [employer]) Complete  Some recent data might be hidden

## 2021-09-12 LAB — GLUCOSE, CAPILLARY
Glucose-Capillary: 83 mg/dL (ref 70–99)
Glucose-Capillary: 161 mg/dL — ABNORMAL HIGH (ref 70–99)
Glucose-Capillary: 379 mg/dL — ABNORMAL HIGH (ref 70–99)
Glucose-Capillary: 370 mg/dL — ABNORMAL HIGH (ref 70–99)
Glucose-Capillary: 338 mg/dL — ABNORMAL HIGH (ref 70–99)

## 2021-09-12 MED ORDER — INSULIN ASPART 100 UNIT/ML IJ SOLN
0.0000 [IU] | Freq: Three times a day (TID) | INTRAMUSCULAR | Status: DC
  Administered 2021-09-12: 4 [IU] via SUBCUTANEOUS
  Administered 2021-09-13: 1 [IU] via SUBCUTANEOUS
  Administered 2021-09-13: 2 [IU] via SUBCUTANEOUS
  Filled 2021-09-12 (×3): qty 1

## 2021-09-12 MED ORDER — LOPERAMIDE HCL 2 MG PO CAPS
2.0000 mg | ORAL_CAPSULE | ORAL | Status: DC | PRN
  Administered 2021-09-12 (×2): 2 mg via ORAL
  Filled 2021-09-12 (×2): qty 1

## 2021-09-12 MED ORDER — INSULIN GLARGINE-YFGN 100 UNIT/ML ~~LOC~~ SOLN
25.0000 [IU] | Freq: Every day | SUBCUTANEOUS | Status: DC
Start: 2021-09-13 — End: 2021-09-13
  Administered 2021-09-13: 10:00:00 25 [IU] via SUBCUTANEOUS
  Filled 2021-09-12 (×2): qty 0.25

## 2021-09-12 MED ORDER — OXYMETAZOLINE HCL 0.05 % NA SOLN
1.0000 | Freq: Two times a day (BID) | NASAL | Status: DC
  Administered 2021-09-12 – 2021-09-13 (×2): 1 via NASAL
  Filled 2021-09-12: qty 15

## 2021-09-12 NOTE — Care Management Important Message (Signed)
Important Message  Patient Details  Name: Gary [name] MRN: [id number] Date of Birth: [birth year]   Medicare Important Message Given:  Yes  Patient is in an isolation room so I called his room [phone] to review the Important Message from Medicare with him. He asked that I call his sister, [name].  I called Ms. Peacock [phone]) and reviewed the Important Message from Medicare with her as well.  She was happy with the planned discharge for tomorrow.  I asked if they would like a copy and she declined.  I thanked her for time. She thanked the team for being so nice to her brother.   Juliann Pulse A Doylene Splinter 09/12/2021, 10:41 AM

## 2021-09-12 NOTE — Progress Notes (Signed)
Inpatient Diabetes Program Recommendations  AACE/ADA: New Consensus Statement on Inpatient Glycemic Control   Target Ranges:  Prepandial:   less than 140 mg/dL      [facility] postprandial:   less than 180 mg/dL (1-2 hours)      Critically ill patients:  140 - 180 mg/dL    Latest Reference Range & Units 09/12/21 05:48 09/12/21 08:19  Glucose-Capillary 70 - 99 mg/dL 83 161 (H)    Latest Reference Range & Units 09/11/21 08:27 09/11/21 11:50 09/11/21 17:07 09/11/21 22:13  Glucose-Capillary 70 - 99 mg/dL 233 (H) 342 (H) 301 (H) 119 (H)   Review of Glycemic Control  Diabetes history: DM1 Outpatient Diabetes medications: Medtronic 630G Insulin Pump with Novolog; Dexcom CGM Current orders for Inpatient glycemic control: Semglee 19 units daily, Novolog 0-6 units TID with meals, Novolog 6 units TID with meals  Inpatient Diabetes Program Recommendations:    Insulin: Please consider increasing meal coverage to Novolog 8 units TID with meals.   Supplements: May want to consider switching from Glucerna (as 27 grams of carbs) to a supplement with less carbohydrates (such as Ensure Max has 4 grams of carbs) if PO supplements still needed.  Thanks, Barnie Alderman, RN, MSN, CDE Diabetes Coordinator Inpatient Diabetes Program [phone] (Team Pager from 8am to 5pm)

## 2021-09-12 NOTE — Progress Notes (Signed)
PROGRESS NOTE  Gary [name] NKN:[id number] DOB: Feb 12, 1946 DOA: 08/29/2021 PCP: [name], MD  Brief History   Gary [name] is a 75 y.o. male with medical history significant of type 1 diabetes on insulin pump, hypertension, COPD, hypothyroidism, depression, peripheral neuropathy, gastroparesis, mantle cell lymphoma, hypothyroidism, Graves' disease, CHF, s/p of nephrectomy, CAD, CKD-3A, SSS, s/p of pacemaker placement, iron deficiency anemia, lymphedema, who presented with fall.   Pt is a poor historian.  History was limited.  Per report, patient recently lost his wife.  He lives alone at home.  Patient fell, and injured his left chest wall.  Complained of left rib cage pain.  He stated that he couldn't take deep breaths because of pain.     Of note, patient had fall and was seen in ED on 11/14. He had skin laceration in left hand which was sutured up. He had unremarkable CTs of his head, face and cervical spine that time.   Patient was found to have hypoglycemia with blood sugar 19 on presentation.  Insulin pump discontinued.  Pt transitioned to subQ insulin.  CT of the chest demonstrated an acute minimally displaced fracture of the left seventh rib is seen laterally. There were also multiple healed bilateral rib fractures noted.  [facility] hospitalists were consulted to admit the patient for further evaluation and care.  Hypoglycemia was treated with D50 and D10 gtt. The patient's insulin pump was discontinued and the diabetic coordinator was consulted. The patient was placed on glargine 19u daily with 4u of fast acting insulin at mealtime tid with sensitive scale SSI. Issue of insulin pump may be taken up by patient with Dr. Honor Junes to discuss its appropriateness going forward after the patient is discharged from rehab. Chest pain has resolved.   The patient has been evaluated by PT/OT. They have recommended SNF. The patient has been accepted for a bed at [facility] on Saturday.   Consultants   Wound care  Procedures  None  Antibiotics   Anti-infectives (From admission, onward)    None      Subjective  The patient is resting comfortably. No new complaints.  Objective   Vitals:  Vitals:   09/12/21 0803 09/12/21 1214  BP: 106/61 125/83  Pulse: 60 63  Resp: 20 18  Temp: 97.6 F (36.4 C) (!) 97.4 F (36.3 C)  SpO2: 99% 100%    Exam:  Constitutional:  The patient is awake, alert, and oriented x 3. No acute distress. Respiratory:  No increased work of breathing. No wheezes, rales, or rhonchi No tactile fremitus Cardiovascular:  Regular rate and rhythm No murmurs, ectopy, or gallups. No lateral PMI. No thrills. Abdomen:  Abdomen is soft, non-tender, non-distended No hernias, masses, or organomegaly Normoactive bowel sounds.  Musculoskeletal:  No cyanosis, clubbing, or edema Skin:  No rashes, lesions, ulcers palpation of skin: no induration or nodules Neurologic:  CN 2-12 intact Sensation all 4 extremities intact Psychiatric:  Mental status Mood, affect appropriate Orientation to person, place, time  judgment and insight appear intact   I have personally reviewed the following:   Today's Data  Vitals  Lab Data  CBC, BMP  Imaging  CT C- spine CT Chest CT head CT maxillofacial without contrast  Cardiology Data  EKG  Scheduled Meds:  bacitracin   Topical BID   cholecalciferol  2,000 Units Oral Daily   enoxaparin (LOVENOX) injection  0.5 mg/kg Subcutaneous Q24H   feeding supplement (GLUCERNA SHAKE)  237 mL Oral BID BM   ferrous  sulfate  325 mg Oral Q breakfast   FLUoxetine  40 mg Oral Daily   fluticasone  2 spray Each Nare Daily   insulin aspart  0-6 Units Subcutaneous TID WC   insulin aspart  6 Units Subcutaneous TID WC   insulin glargine-yfgn  19 Units Subcutaneous Daily   levothyroxine  25 mcg Oral QAC breakfast   lidocaine  1 patch Transdermal Q24H   ondansetron (ZOFRAN) IV  4 mg Intravenous Once   pravastatin  40 mg Oral  Daily   vitamin B-12  1,000 mcg Oral Daily   Principal Problem:   Hypoglycemia Active Problems:   Chest pain   Hypercholesterolemia   Fall   Iron deficiency anemia   CKD (chronic kidney disease), stage IIIa   Chronic systolic CHF (congestive heart failure) (HCC)   Hypokalemia   Recurrent falls   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   Left rib fracture_7th rib   Leukocytosis   HTN (hypertension)   Hypothyroid   CAD (coronary artery disease)   LOS: 14 days   A & P   Severe symptomatic hypoglycemia 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 On presentation, Patient was treated with D50 and D10 gtt.  Insulin pump d/c'ed.   Diabetes coordinator consulted.   Recent A1c 6.9.   --pt has required frequent adjustment of insulin regimen due to highs and lows and inconsistent oral intake. Plan: --Glucosees have increased to the 300's overnight will increase lantus to 25 units. --increase mealtime to 6u BID --SSI very sensitive scale --patient to follow up with Dr. Honor Junes to discuss resuming insulin pump if appropriate after he is discharged from rehab facility.   Chest pain 2/2  Left rib fracture_7th rib Chest pain has resolved.   Trop neg. -Incentive spirometry   Hypercholesterolemia --cont statin   Fall-recurrent -PT/OT, recommend SNF   Iron deficiency anemia  Hemoglobin stable, 13.7 -Continue iron supplement CKD (chronic kidney disease), stage IIIa Stable.  Baseline creatinine 9.5-7.4 Chronic systolic CHF (congestive heart failure) (HCC)  BNP 198, no respiratory distress.  Not acutely exacerbated --home coreg d/c'ed due to persistent low HR --cont home lasix 40 mg daily Hypokalemia, resolved   Leukocytosis No source of infection identified.   No fever.  Urinalysis negative.  Likely reactive   HTN (hypertension) BP mostly low normal.   --home coreg d/c'ed due to persistent low HR --hold home lasix due to soft BP    Hypothyroid -Continue Synthroid   CAD (coronary artery disease): -Continue pravastatin   Skin laceration in left hand: -Wound care consult, wound care per order as above. --RN to remove sutures   Depression Complicated grief Patient lost his wife of 50+  years 1 year ago Per patients sister, he has been very depressed over the past year Was previously on Prozac, which was restarted during this hospitalization.   --cont Prozac 40 mg daily   COVID infection --pos on 11/23.  Not hypoxic, although appeared more dyspneic. --CXR clear --pt reported loosing his taste Plan: --Monitor respiratory status. --no indication to treat currently --supplemental O2 for comfort   Lymphedema and venous stasis ulcer --wound care consulted, placed Unna boots on 11/21 --weekly exchange by wound care RN   Poor oral intake --Glucerna BID     DVT prophylaxis: Lovenox SQ Code Status: Full code  Family Communication:  Level of care: Med-Surg Dispo:   The patient is from: home Anticipated d/c is to: SNF Anticipated d/c date is: Saturday 09/13/2021   Patient currently  is medically ready to d/c, however must wait until 09/13/2021 to discharge to SNF.   I have seen and examined this patient myself. I have spent 30 minutes in his evaluation and care.  DVT prophylaxis: Lovenox Code Status: Full Code Family Communication: None available Disposition Plan: Plan is to discharge to SNF on Saturday, 09/13/2021    Raeana Blinn, DO [facility] Direct contact: see [email or web address]  7PM-7AM contact night coverage as above 09/12/2021, 3:28 PM  LOS: 13 days

## 2021-09-12 NOTE — Evaluation (Signed)
Occupational Therapy Re-Evaluation Patient Details Name: Gary [name] MRN: [id number] DOB: 01-28-1946 Today's Date: 09/12/2021   History of Present Illness Pt. is  75 y.o. male who was admitted with hypoglycemia, resulting in a fall. Pt. sustained a  L 7th rib fracture, and multiple lacerations. PMHx includes: DM, HTN, HLD, COPD, GERD, Depression, lymphoma, CHF, PPM.   Clinical Impression   Pt was seen for OT re-evaluation and treatment this date. Pt continues to demonstrate impairments as described below (See OT problem list) which functionally limit his ability to perform ADL/self-care tasks. Pt demonstrating progress towards goals this date. Requires supervision for bed mobility, MIN A from slightly elevated bed, CGA from std toilet with use of grab bar + wall, and MAX A for LB dressing. Pt endorses feeling SOB. SpO2 100%, HR ~100 with exertion. Pt instructed in PLB and activity pacing/rest breaks to support ADL/mobility performance while minimizing SOB. Pt verbalized understanding. Pt continues to benefit from skilled OT services to address noted impairments and functional limitations (see below for any additional details) in order to maximize safety and independence while minimizing falls risk and caregiver burden. Goals reviewed and updated to reflect progress. Upon hospital discharge, continue to recommend STR to maximize pt safety and return to PLOF.    Recommendations for follow up therapy are one component of a multi-disciplinary discharge planning process, led by the attending physician.  Recommendations may be updated based on patient status, additional functional criteria and insurance authorization.   Follow Up Recommendations  Skilled nursing-short term rehab (<3 hours/day)    Assistance Recommended at Discharge Frequent or constant Supervision/Assistance  Functional Status Assessment  Patient has had a recent decline in their functional status and demonstrates the ability to make  significant improvements in function in a reasonable and predictable amount of time.  Equipment Recommendations  BSC/3in1;Tub/shower bench;Other (comment) (LH sponge, reacher, LH shoe horn, sock aide)    Recommendations for Other Services       Precautions / Restrictions Precautions Precautions: Fall Restrictions Weight Bearing Restrictions: No      Mobility Bed Mobility Overal bed mobility: Needs Assistance Bed Mobility: Supine to Sit;Sit to Supine     Supine to sit: Supervision Sit to supine: Supervision   General bed mobility comments: increased time/effort, use of bed rails but no direct assist required    Transfers Overall transfer level: Needs assistance Equipment used: Rolling walker (2 wheels) Transfers: Sit to/from Stand;Bed to chair/wheelchair/BSC Sit to Stand: Min assist;From elevated surface                  Balance Overall balance assessment: History of Falls;Needs assistance Sitting-balance support: Feet supported;No upper extremity supported Sitting balance-Leahy Scale: Fair     Standing balance support: Single extremity supported;During functional activity;Reliant on assistive device for balance Standing balance-Leahy Scale: Fair Standing balance comment: In standing, pt able to clean nose with tissue using LUE with RUE on RW, no LOB                           ADL either performed or assessed with clinical judgement   ADL Overall ADL's : Needs assistance/impaired     Grooming: Sitting;Set up;Supervision/safety               Lower Body Dressing: Sitting/lateral leans;Maximal assistance Lower Body Dressing Details (indicate cue type and reason): max a for donning, doffing socks Toilet Transfer: Min guard;Rolling walker (2 wheels);Grab bars;Ambulation;Regular Glass blower/designer Details (indicate  cue type and reason): from std height commode, pt required CGA + heavy reliance on wall and grab bar to hoist himself  up [place] and Hygiene: Sitting/lateral lean;Set up               Vision         Perception     Praxis      Pertinent Vitals/Pain Pain Assessment: No/denies pain     Hand Dominance     Extremity/Trunk Assessment Upper Extremity Assessment Upper Extremity Assessment: Generalized weakness   Lower Extremity Assessment Lower Extremity Assessment: Generalized weakness   Cervical / Trunk Assessment Cervical / Trunk Assessment: Kyphotic   Communication Communication Communication: No difficulties   Cognition Arousal/Alertness: Awake/alert Behavior During Therapy: WFL for tasks assessed/performed Overall Cognitive Status: Within Functional Limits for tasks assessed                                       General Comments  Pt noted to have a nose bleed after toileting, pt placed tissue in nose to stop it, RN notified.    Exercises Other Exercises Other Exercises: Pt instructed in PLB and use of rest breaks/activity pacing to support ADL/mobility efforts while minimizing SOB. SpO2 100% on room air, HR ~100 with exertion.   Shoulder Instructions      Home Living Family/patient expects to be discharged to:: Private residence Living Arrangements: Alone Available Help at Discharge: Friend(s);Neighbor;Available PRN/intermittently Type of Home: House Home Access: Ramped entrance     Home Layout: One level     Bathroom Shower/Tub: Teacher, early years/pre: Standard     Home Equipment: Conservation officer, nature (2 wheels);Cane - single point          Prior Functioning/Environment Prior Level of Function : History of Falls (last six months);Independent/Modified Independent             Mobility Comments: Pt reports use of SPC as needed; ADLs Comments: reports no assistance needed for ADL; assistance for IADL- cooking, cleaning; Pt reports he does drive        OT Problem List: Decreased strength;Decreased activity  tolerance;Decreased knowledge of use of DME or AE;Impaired balance (sitting and/or standing)      OT Treatment/Interventions: Self-care/ADL training;Energy conservation;Balance training;Therapeutic exercise;DME and/or AE instruction;Therapeutic activities;Patient/family education    OT Goals(Current goals can be found in the care plan section) Acute Rehab OT Goals Patient Stated Goal: to return home OT Goal Formulation: With patient Time For Goal Achievement: 09/26/21 Potential to Achieve Goals: Good ADL Goals Pt Will Perform Lower Body Dressing: with min assist;sit to/from stand;with adaptive equipment Pt Will Transfer to Toilet: with modified independence (elevated commode, LRAD PRN)  OT Frequency: Min 3X/week   Barriers to D/C:            Co-evaluation              AM-PAC OT "6 Clicks" Daily Activity     Outcome Measure Help from another person eating meals?: None Help from another person taking care of personal grooming?: None Help from another person toileting, which includes using toliet, bedpan, or urinal?: A Little Help from another person bathing (including washing, rinsing, drying)?: A Lot Help from another person to put on and taking off regular upper body clothing?: A Little Help from another person to put on and taking off regular lower body clothing?: A Lot 6 Click Score:  18   End of Session Equipment Utilized During Treatment: Rolling walker (2 wheels) Nurse Communication: Other (comment) (nose bleed)  Activity Tolerance: Patient tolerated treatment well Patient left: in bed;with call bell/phone within reach;with bed alarm set  OT Visit Diagnosis: Unsteadiness on feet (R26.81);Other abnormalities of gait and mobility (R26.89);Repeated falls (R29.6)                Time: 5621-3086 OT Time Calculation (min): 24 min Charges:  OT General Charges $OT Visit: 1 Visit OT Evaluation $OT Re-eval: 1 Re-eval OT Treatments $Self Care/Home Management : 8-22  mins  Ardeth Perfect., MPH, MS, OTR/L ascom [phone] 09/12/21, 4:38 PM

## 2021-09-13 DIAGNOSIS — K5900 Constipation, unspecified: Secondary | ICD-10-CM | POA: Diagnosis not present

## 2021-09-13 DIAGNOSIS — J449 Chronic obstructive pulmonary disease, unspecified: Secondary | ICD-10-CM | POA: Diagnosis not present

## 2021-09-13 DIAGNOSIS — F32A Depression, unspecified: Secondary | ICD-10-CM | POA: Diagnosis not present

## 2021-09-13 DIAGNOSIS — I890 Lymphedema, not elsewhere classified: Secondary | ICD-10-CM | POA: Diagnosis not present

## 2021-09-13 DIAGNOSIS — G629 Polyneuropathy, unspecified: Secondary | ICD-10-CM | POA: Diagnosis not present

## 2021-09-13 DIAGNOSIS — M6281 Muscle weakness (generalized): Secondary | ICD-10-CM | POA: Diagnosis not present

## 2021-09-13 DIAGNOSIS — E1165 Type 2 diabetes mellitus with hyperglycemia: Secondary | ICD-10-CM | POA: Diagnosis not present

## 2021-09-13 DIAGNOSIS — R41841 Cognitive communication deficit: Secondary | ICD-10-CM | POA: Diagnosis not present

## 2021-09-13 DIAGNOSIS — R2681 Unsteadiness on feet: Secondary | ICD-10-CM | POA: Diagnosis not present

## 2021-09-13 DIAGNOSIS — I10 Essential (primary) hypertension: Secondary | ICD-10-CM | POA: Diagnosis not present

## 2021-09-13 DIAGNOSIS — D509 Iron deficiency anemia, unspecified: Secondary | ICD-10-CM | POA: Diagnosis not present

## 2021-09-13 DIAGNOSIS — E162 Hypoglycemia, unspecified: Secondary | ICD-10-CM | POA: Diagnosis not present

## 2021-09-13 DIAGNOSIS — E039 Hypothyroidism, unspecified: Secondary | ICD-10-CM | POA: Diagnosis not present

## 2021-09-13 DIAGNOSIS — E7849 Other hyperlipidemia: Secondary | ICD-10-CM | POA: Diagnosis not present

## 2021-09-13 DIAGNOSIS — I5023 Acute on chronic systolic (congestive) heart failure: Secondary | ICD-10-CM | POA: Diagnosis not present

## 2021-09-13 DIAGNOSIS — R5381 Other malaise: Secondary | ICD-10-CM | POA: Diagnosis not present

## 2021-09-13 DIAGNOSIS — J99 Respiratory disorders in diseases classified elsewhere: Secondary | ICD-10-CM | POA: Diagnosis not present

## 2021-09-13 DIAGNOSIS — R531 Weakness: Secondary | ICD-10-CM | POA: Diagnosis not present

## 2021-09-13 DIAGNOSIS — R600 Localized edema: Secondary | ICD-10-CM | POA: Diagnosis not present

## 2021-09-13 DIAGNOSIS — I495 Sick sinus syndrome: Secondary | ICD-10-CM | POA: Diagnosis not present

## 2021-09-13 DIAGNOSIS — E569 Vitamin deficiency, unspecified: Secondary | ICD-10-CM | POA: Diagnosis not present

## 2021-09-13 DIAGNOSIS — R5383 Other fatigue: Secondary | ICD-10-CM | POA: Diagnosis not present

## 2021-09-13 DIAGNOSIS — E1022 Type 1 diabetes mellitus with diabetic chronic kidney disease: Secondary | ICD-10-CM | POA: Diagnosis not present

## 2021-09-13 DIAGNOSIS — I2510 Atherosclerotic heart disease of native coronary artery without angina pectoris: Secondary | ICD-10-CM | POA: Diagnosis not present

## 2021-09-13 DIAGNOSIS — Z741 Need for assistance with personal care: Secondary | ICD-10-CM | POA: Diagnosis not present

## 2021-09-13 DIAGNOSIS — M6258 Muscle wasting and atrophy, not elsewhere classified, other site: Secondary | ICD-10-CM | POA: Diagnosis not present

## 2021-09-13 DIAGNOSIS — Z7401 Bed confinement status: Secondary | ICD-10-CM | POA: Diagnosis not present

## 2021-09-13 DIAGNOSIS — S2232XD Fracture of one rib, left side, subsequent encounter for fracture with routine healing: Secondary | ICD-10-CM | POA: Diagnosis not present

## 2021-09-13 DIAGNOSIS — J439 Emphysema, unspecified: Secondary | ICD-10-CM | POA: Diagnosis not present

## 2021-09-13 LAB — CBC WITH DIFFERENTIAL/PLATELET
Abs Immature Granulocytes: 0.07 10*3/uL (ref 0.00–0.07)
Basophils Absolute: 0 10*3/uL (ref 0.0–0.1)
Basophils Relative: 1 %
Eosinophils Absolute: 0.2 10*3/uL (ref 0.0–0.5)
Eosinophils Relative: 7 %
HCT: 40.8 % (ref 39.0–52.0)
Hemoglobin: 13.5 g/dL (ref 13.0–17.0)
Immature Granulocytes: 3 %
Lymphocytes Relative: 32 %
Lymphs Abs: 0.9 10*3/uL (ref 0.7–4.0)
MCH: 30.2 pg (ref 26.0–34.0)
MCHC: 33.1 g/dL (ref 30.0–36.0)
MCV: 91.3 fL (ref 80.0–100.0)
Monocytes Absolute: 0.5 10*3/uL (ref 0.1–1.0)
Monocytes Relative: 17 %
Neutro Abs: 1.2 10*3/uL — ABNORMAL LOW (ref 1.7–7.7)
Neutrophils Relative %: 40 %
Platelets: 122 10*3/uL — ABNORMAL LOW (ref 150–400)
RBC: 4.47 MIL/uL (ref 4.22–5.81)
RDW: 12.8 % (ref 11.5–15.5)
WBC: 2.8 10*3/uL — ABNORMAL LOW (ref 4.0–10.5)
nRBC: 0 % (ref 0.0–0.2)

## 2021-09-13 LAB — BASIC METABOLIC PANEL
Anion gap: 8 (ref 5–15)
BUN: 29 mg/dL — ABNORMAL HIGH (ref 8–23)
CO2: 32 mmol/L (ref 22–32)
Calcium: 8.4 mg/dL — ABNORMAL LOW (ref 8.9–10.3)
Chloride: 95 mmol/L — ABNORMAL LOW (ref 98–111)
Creatinine, Ser: 1.51 mg/dL — ABNORMAL HIGH (ref 0.61–1.24)
GFR, Estimated: 48 mL/min — ABNORMAL LOW (ref >60)
Glucose, Bld: 160 mg/dL — ABNORMAL HIGH (ref 70–99)
Potassium: 4.3 mmol/L (ref 3.5–5.1)
Sodium: 135 mmol/L (ref 135–145)

## 2021-09-13 LAB — GLUCOSE, CAPILLARY
Glucose-Capillary: 152 mg/dL — ABNORMAL HIGH (ref 70–99)
Glucose-Capillary: 239 mg/dL — ABNORMAL HIGH (ref 70–99)

## 2021-09-13 MED ORDER — GLUCERNA SHAKE PO LIQD: 237.0000 mL | Freq: Two times a day (BID) | ORAL | 0 refills | Status: DC

## 2021-09-13 MED ORDER — INSULIN GLARGINE-YFGN 100 UNIT/ML ~~LOC~~ SOLN: 25.0000 [IU] | Freq: Every day | SUBCUTANEOUS | 11 refills | Status: DC

## 2021-09-13 NOTE — Discharge Summary (Signed)
Physician Discharge Summary  Gary [name] IWP:[id number] DOB: January 01, 1946 DOA: 08/29/2021  PCP: [name], MD  Admit date: 08/29/2021 Discharge date: 09/13/2021  Recommendations for Outpatient Follow-up:  The patient will be discharged to rehab Follow up with PCP in 7-10 days. He should have chemisty, glucoses, and hand evaluated on this visit.   Contact information for follow-up providers     [name], MD Follow up in 1 week(s).   Specialty: Internal Medicine Why: Check chemistry, glucoses, and hand on visit. Contact information: [address] [place] [address] [phone]              Contact information for after-discharge care     Destination     [facility] Preferred [facility] .   Service: Skilled Nursing Contact information: [address] [place] [phone]                      Discharge Diagnoses: Principal diagnosis is #1 Severe symptomatic hypoglycemia Left rib fracture and chest pain Recurrent falls Skin laceration to hand Iron deficient anemia CKD IIIa Systolic CHF Hypothyroidism Hypokalemia   Discharge Condition: Fair  Disposition: [facility]  Diet recommendation: Heart healthy  Filed Weights   08/30/21 0413  Weight: 102.1 kg   History of present illness: Gary [name] is a 75 y.o. male with medical history significant of type 1 diabetes on insulin pump, hypertension, hyperlipidemia, COPD, GERD, hypothyroidism, depression, peripheral neuropathy, gastroparesis, mantle cell lymphoma, hypothyroidism, Graves' disease, CHF, s/p of nephrectomy, CAD, CKD-3A, SSS, s/p of pacemaker placement, iron deficiency anemia, lymphedema, who presents with fall.   Pt is a poor historian.  History is limited.  Per report, patient recently lost his wife.  He lives alone at home.  Patient fell yesterday, and injured his left chest wall.  Complains of left rib cage pain.  He  states that he cannot take deep breaths because of pain.  Initially patient was drowsy, seems to be altered, but when I saw patient in the ED, he is alert, oriented x3.  He moves all extremities normally.  No facial droop or slurred speech.  He states that he has mild shortness of breath and left side of chest wall pain.  No cough, fever or chills.  Denies nausea vomiting, diarrhea or abdominal pain.  No symptoms of UTI.  Patient has lymphedema and venous ulcer in legs. Both legs are wrapped up.  Of note, patient had fall and was seen in ED on 11/14. He had skin laceration in left hand which was sutured up. He had unremarkable CTs of his head, face and cervical spine that time.   Patient was found to have hypoglycemia with blood sugar 19, insulin pump with discontinued.   ED Course: pt was found to have WBC 12.2, INR 1.2, troponin level 14, 11, ammonia level 19, alcohol level less than 10, negative COVID PCR, renal function close to baseline, potassium 3.1, magnesium 2.0, temperature normal, soft blood pressure 91/45, heart rate 79, RR 24, oxygen saturation 97-100% on room air.  VBG with pH 7.26, CO2 58, O2  <31. pt is admitted to med-surg bed as inpt by accepting MD.    Hospital Course:  Gary [name] is a 75 y.o. male with medical history significant of type 1 diabetes on insulin pump, hypertension, COPD, hypothyroidism, depression, peripheral neuropathy, gastroparesis, mantle cell lymphoma, hypothyroidism, Graves' disease, CHF, s/p of nephrectomy, CAD, CKD-3A, SSS, s/p of pacemaker placement, iron  deficiency anemia, lymphedema, who presented with fall.   Pt is a poor historian.  History was limited.  Per report, patient recently lost his wife.  He lives alone at home.  Patient fell, and injured his left chest wall.  Complained of left rib cage pain.  He stated that he couldn't take deep breaths because of pain.     Of note, patient had fall and was seen in ED on 11/14. He had skin laceration in left  hand which was sutured u. He had unremarkable CTs of his head, face and cervical spine that time.   Patient was found to have hypoglycemia with blood sugar 19 on presentation.  Insulin pump discontinued.  Pt transitioned to subQ insulin.   CT of the chest demonstrated an acute minimally displaced fracture of the left seventh rib is seen laterally. There were also multiple healed bilateral rib fractures noted.   [facility] hospitalists were consulted to admit the patient for further evaluation and care.  Hypoglycemia was treated with D50 and D10 gtt. The patient's insulin pump was discontinued and the diabetic coordinator was consulted. The patient was placed on glargine 19u daily with 4u of fast acting insulin at mealtime tid with sensitive scale SSI. Issue of insulin pump may be taken up by patient with Dr. Honor Junes to discuss its appropriateness going forward after the patient is discharged from rehab. Chest pain has resolved.    The patient has been evaluated by PT/OT. They have recommended SNF. The patient may be taken off of precautions for COVID today. The patient has been accepted for a bed at [facility] on Saturday.    Today's assessment: S: The patient is resting comfortably. No new complaints. O: Vitals:  Vitals:   09/13/21 1044 09/13/21 1238  BP: (!) 116/58 108/63  Pulse: (!) 59 60  Resp: 18 15  Temp: 97.8 F (36.6 C) 98.3 F (36.8 C)  SpO2: 99% 98%    Exam:   Constitutional:  The patient is awake, alert, and oriented x 3. No acute distress. Respiratory:  No increased work of breathing. No wheezes, rales, or rhonchi No tactile fremitus Cardiovascular:  Regular rate and rhythm No murmurs, ectopy, or gallups. No lateral PMI. No thrills. Abdomen:  Abdomen is soft, non-tender, non-distended No hernias, masses, or organomegaly Normoactive bowel sounds.  Musculoskeletal:  No cyanosis, clubbing, or edema Skin:  No rashes, lesions, ulcers palpation of skin: no induration or  nodules Neurologic:  CN 2-12 intact Sensation all 4 extremities intact Psychiatric:  Mental status Mood, affect appropriate Orientation to person, place, time  judgment and insight appear intact  Discharge Instructions  Discharge Instructions     Activity as tolerated - No restrictions   Complete by: As directed    Call MD for:  persistant nausea and vomiting   Complete by: As directed    Call MD for:  severe uncontrolled pain   Complete by: As directed    Call MD for:  temperature >100.4   Complete by: As directed    Diet - low sodium heart healthy   Complete by: As directed    Discharge instructions   Complete by: As directed    Discharge to [facility] for PT/OT Patient should have chemistry checked on 09/16/2021 and reported to facility physician. Glucose should be checked daily and recorded. The patient should take these with him when he follows up with PCP. Follow up with PCP in 7-10 days.   Discharge wound care:   Complete by: As  directed    Wound care to sutured lacerations on left hand: Cleanse with NS, apt dry. Apply a thin layer of bacitracin ointment. Leave open to air. - -   Increase activity slowly   Complete by: As directed    No wound care   Complete by: As directed       Allergies as of 09/13/2021       Reactions   Rofecoxib Nausea Only        Medication List     STOP taking these medications    insulin aspart 100 UNIT/ML injection Commonly known as: NovoLOG Replaced by: insulin aspart 100 UNIT/ML injection   nystatin cream Commonly known as: MYCOSTATIN   potassium chloride 10 MEQ tablet Commonly known as: KLOR-CON       TAKE these medications    carvedilol 3.125 MG tablet Commonly known as: COREG Take 1 tablet (3.125 mg total) by mouth 2 (two) times daily with a meal. Hold if systolic <749 and HR <44 What changed: additional instructions   feeding supplement (GLUCERNA SHAKE) Liqd Take 237 mLs by mouth 2 (two) times daily  between meals.   ferrous sulfate 325 (65 FE) MG tablet Take 325 mg by mouth daily with breakfast.   FLUoxetine 20 MG capsule Commonly known as: PROZAC Take 1 capsule (20 mg total) by mouth daily.   furosemide 40 MG tablet Commonly known as: LASIX Take 1 tablet (40 mg total) by mouth daily. What changed:  how much to take when to take this additional instructions   glucose 4 GM chewable tablet Chew 1 tablet (4 g total) by mouth once as needed for low blood sugar.   insulin aspart 100 UNIT/ML injection Commonly known as: novoLOG Inject 0-9 Units into the skin 3 (three) times daily with meals. CBG 70 - 120: 0 units CBG 121 - 150: 1 unit CBG 151 - 200: 2 units CBG 201 - 250: 3 units CBG 251 - 300: 5 units CBG 301 - 350: 7 units CBG 351 - 400: 9 units Replaces: insulin aspart 100 UNIT/ML injection   insulin aspart 100 UNIT/ML injection Commonly known as: novoLOG Inject 0-5 Units into the skin at bedtime. CBG 70 - 120: 0 units CBG 121 - 150: 0 units CBG 151 - 200: 0 units CBG 201 - 250: 2 units CBG 251 - 300: 3 units CBG 301 - 350: 4 units CBG 351 - 400: 5 units   insulin aspart 100 UNIT/ML injection Commonly known as: novoLOG Inject 6 Units into the skin 3 (three) times daily with meals.   insulin glargine-yfgn 100 UNIT/ML injection Commonly known as: SEMGLEE Inject 0.25 mLs (25 Units total) into the skin daily. Start taking on: September 14, 2021   levothyroxine 25 MCG tablet Commonly known as: SYNTHROID Take 25 mcg by mouth daily before breakfast.   lidocaine 5 % Commonly known as: LIDODERM Place 1 patch onto the skin daily. Remove & Discard patch within 12 hours or as directed by MD   methocarbamol 750 MG tablet Commonly known as: ROBAXIN Take 1 tablet (750 mg total) by mouth every 6 (six) hours as needed for muscle spasms.   polyethylene glycol 17 g packet Commonly known as: MIRALAX / GLYCOLAX Take 17 g by mouth 2 (two) times daily.   polyvinyl alcohol 1.4 %  ophthalmic solution Commonly known as: LIQUIFILM TEARS Place 1 drop into both eyes as needed for dry eyes.   pravastatin 40 MG tablet Commonly known as: PRAVACHOL Take 40 mg  by mouth daily.   vitamin B-12 1000 MCG tablet Commonly known as: CYANOCOBALAMIN Take 1,000 mcg by mouth daily.   vitamin D3 50 MCG (2000 UT) Caps Take 2,000 Units by mouth daily.               Discharge Care Instructions  (From admission, onward)           Start     Ordered   09/03/21 0000  Discharge wound care:       Comments: Wound care to sutured lacerations on left hand: Cleanse with NS, apt dry. Apply a thin layer of bacitracin ointment. Leave open to air. - -   09/03/21 1313           Allergies  Allergen Reactions   Rofecoxib Nausea Only    The results of significant diagnostics from this hospitalization (including imaging, microbiology, ancillary and laboratory) are listed below for reference.    Significant Diagnostic Studies: DG Chest 1 View  Result Date: 08/25/2021 CLINICAL DATA:  Lost balance leading to fall on concrete Dr rule out pneumothorax. EXAM: CHEST  1 VIEW COMPARISON:  Chest and left rib series earlier today. Chest CT 08/01/2021 FINDINGS: Right chest port tip in the SVC. Dual lead left-sided pacemaker in place. Unchanged heart size and mediastinal contours with stable mild cardiomegaly. No pneumothorax. No acute chest findings or interval change from earlier today. Emphysema with areas of scarring. Right proximal humerus deformity is chronic. IMPRESSION: No pneumothorax or interval change from earlier today. Electronically Signed   By: Keith Rake M.D.   On: 08/25/2021 15:34   DG Ribs Unilateral W/Chest Left  Result Date: 08/25/2021 CLINICAL DATA:  Fall EXAM: LEFT RIBS AND CHEST - 3+ VIEW COMPARISON:  07/14/2018 FINDINGS: No fracture or other bone lesions are seen involving the ribs. There is no evidence of pneumothorax or pleural effusion. Both lungs are clear.  Cardiomegaly with left chest multi lead pacer. Right chest port catheter. IMPRESSION: 1. No displaced rib fracture or other radiographic abnormality of the ribs. 2. Cardiomegaly. Electronically Signed   By: Delanna Ahmadi M.D.   On: 08/25/2021 13:58   DG Knee 2 Views Left  Result Date: 08/25/2021 CLINICAL DATA:  Lost balance leading to fall on concrete driveway. EXAM: LEFT KNEE - 1-2 VIEW COMPARISON:  None. FINDINGS: No evidence of fracture, dislocation, or joint effusion. Minor degenerative change with peripheral spurring and spurring of the tibial spines. The alignment is normal. Joint spaces are preserved. Diffuse generalized soft tissue edema. IMPRESSION: Generalized soft tissue edema. No acute osseous abnormality. Electronically Signed   By: Keith Rake M.D.   On: 08/25/2021 15:37   CT HEAD WO CONTRAST (5MM)  Result Date: 08/29/2021 CLINICAL DATA:  Altered mental status, Unwitnessed fall, left rib pain EXAM: CT HEAD WITHOUT CONTRAST CT MAXILLOFACIAL WITHOUT CONTRAST CT CERVICAL SPINE WITHOUT CONTRAST CT CHEST, ABDOMEN AND PELVIS WITH CONTRAST TECHNIQUE: Contiguous axial images were obtained from the base of the skull through the vertex without intravenous contrast. Multidetector CT imaging of the maxillofacial structures was performed. Multiplanar CT image reconstructions were also generated. A small metallic BB was placed on the right temple in order to reliably differentiate right from left. Multidetector CT imaging of the cervical spine was performed without intravenous contrast. Multiplanar CT image reconstructions were also generated. Multidetector CT imaging of the chest, abdomen and pelvis was performed following the standard protocol during bolus administration of intravenous contrast. CONTRAST:  156mL OMNIPAQUE IOHEXOL 300 MG/ML  SOLN COMPARISON:  08/25/2021,  07/29/2020 FINDINGS: CT HEAD FINDINGS Brain: Normal anatomic configuration. Parenchymal volume loss is commensurate with the  patient's age. Mild periventricular white matter changes are present likely reflecting the sequela of small vessel ischemia. No abnormal intra or extra-axial mass lesion or fluid collection. No abnormal mass effect or midline shift. No evidence of acute intracranial hemorrhage or infarct. Ventricular size is normal. Cerebellum unremarkable. Vascular: No asymmetric hyperdense vasculature at the skull base. Skull: Intact Other: Mastoid air cells and middle ear cavities are clear. Small left frontal scalp hematoma noted. CT MAXILLOFACIAL FINDINGS Osseous: No fracture or mandibular dislocation. No destructive process. Orbits: Ocular lenses have been removed. The orbits are otherwise unremarkable. Sinuses: The paranasal sinuses are clear. Soft tissues: Mild left preseptal and infraorbital soft tissue swelling. CT CERVICAL SPINE FINDINGS Alignment: 2 mm anterolisthesis of C3 upon C4 is unchanged. Skull base and vertebrae: Craniocervical alignment is normal. Atlantodental interval is not widened. No acute fracture the cervical spine. Vertebral body height has been preserved. Soft tissues and spinal canal: No prevertebral fluid or swelling. No visible canal hematoma. Disc levels: Intervertebral disc space narrowing and endplate remodeling at D6-3 and C6-7 is in keeping with changes of moderate degenerative disc disease. Minimal degenerative changes are seen throughout the remainder of the cervical spine. The prevertebral soft tissues are not thickened on sagittal reformats. Mild central canal stenosis at C3-4 secondary to anterolisthesis and associated posterior disc osteophyte complex. Review of the axial images demonstrates multilevel uncovertebral and facet arthrosis resulting in moderate left and mild right neuroforaminal narrowing at C3-4 and C4-5. Other: None CT CHEST FINDINGS Cardiovascular: Moderate coronary artery calcification. Global cardiac size within normal limits. No pericardial effusion. Central pulmonary  arteries are of normal caliber. The thoracic aorta is unremarkable. Left subclavian dual lead pacemaker is in place with leads within the right atrium and right ventricle. Left internal jugular chest port tip is seen within the superior vena cava. Mediastinum/Nodes: No enlarged mediastinal, hilar, or axillary lymph nodes. Thyroid gland, trachea, and esophagus demonstrate no significant findings. Lungs/Pleura: Mild basilar predominant pulmonary fibrotic change. No focal pulmonary infiltrate. No pneumothorax or pleural effusion. No central obstructing mass. Musculoskeletal: An acute minimally displaced fracture of the left seventh rib is seen laterally. Multiple healed bilateral rib fractures are noted. CT ABDOMEN AND PELVIS FINDINGS Hepatobiliary: No focal liver abnormality is seen. Status post cholecystectomy. No biliary dilatation. Pancreas: Unremarkable Spleen: Unremarkable Adrenals/Urinary Tract: Adrenal glands are unremarkable. Status post right nephrectomy. Left kidney is normal in size and position. Stable probable hyperdense renal cyst arising from the lower pole of the left kidney. Left kidney is otherwise unremarkable. Bladder unremarkable. Stomach/Bowel: The stomach, small bowel, and large bowel are unremarkable. No free intraperitoneal gas or fluid. Vascular/Lymphatic: Aortic atherosclerosis. No enlarged abdominal or pelvic lymph nodes. Reproductive: Mild prostatic enlargement. Other: Tiny left fat containing inguinal hernia Musculoskeletal: No acute bone abnormality within the abdomen and pelvis. Osseous structures are age-appropriate. IMPRESSION: No acute intracranial injury. No calvarial fracture. Small left frontal scalp hematoma. No acute facial fracture. Mild left preseptal and infraorbital soft tissue swelling. No acute fracture or listhesis of the cervical spine. Acute left seventh rib fracture.  No pneumothorax. No acute intra-abdominal pathology. Additional incidental findings as noted above.  Aortic Atherosclerosis (ICD10-I70.0).  Fall Electronically Signed   By: Fidela Salisbury M.D.   On: 08/29/2021 02:56   CT HEAD WO CONTRAST (5MM)  Result Date: 08/25/2021 CLINICAL DATA:  Trauma, fall EXAM: CT HEAD WITHOUT CONTRAST TECHNIQUE: Contiguous axial images were obtained  from the base of the skull through the vertex without intravenous contrast. COMPARISON:  None. FINDINGS: Brain: There are no signs of bleeding within the cranium. Ventricles are not dilated. There is no shift of midline structures. Cortical sulci are prominent. Vascular: Unremarkable. Skull: There is subcutaneous contusion/hematoma in the left periorbital region and in the anterior frontal scalp. No definite fracture is seen in the calvarium. Sinuses/Orbits: There are no air-fluid levels or significant mucosal thickening. Other: None IMPRESSION: No acute intracranial findings are seen in noncontrast CT brain. There are no signs of bleeding within the cranium. There is subcutaneous contusion/hematoma in the frontal scalp and left periorbital region. Electronically Signed   By: Elmer Picker M.D.   On: 08/25/2021 13:56   CT Cervical Spine Wo Contrast  Result Date: 08/29/2021 CLINICAL DATA:  Altered mental status, Unwitnessed fall, left rib pain EXAM: CT HEAD WITHOUT CONTRAST CT MAXILLOFACIAL WITHOUT CONTRAST CT CERVICAL SPINE WITHOUT CONTRAST CT CHEST, ABDOMEN AND PELVIS WITH CONTRAST TECHNIQUE: Contiguous axial images were obtained from the base of the skull through the vertex without intravenous contrast. Multidetector CT imaging of the maxillofacial structures was performed. Multiplanar CT image reconstructions were also generated. A small metallic BB was placed on the right temple in order to reliably differentiate right from left. Multidetector CT imaging of the cervical spine was performed without intravenous contrast. Multiplanar CT image reconstructions were also generated. Multidetector CT imaging of the chest, abdomen  and pelvis was performed following the standard protocol during bolus administration of intravenous contrast. CONTRAST:  165mL OMNIPAQUE IOHEXOL 300 MG/ML  SOLN COMPARISON:  08/25/2021, 07/29/2020 FINDINGS: CT HEAD FINDINGS Brain: Normal anatomic configuration. Parenchymal volume loss is commensurate with the patient's age. Mild periventricular white matter changes are present likely reflecting the sequela of small vessel ischemia. No abnormal intra or extra-axial mass lesion or fluid collection. No abnormal mass effect or midline shift. No evidence of acute intracranial hemorrhage or infarct. Ventricular size is normal. Cerebellum unremarkable. Vascular: No asymmetric hyperdense vasculature at the skull base. Skull: Intact Other: Mastoid air cells and middle ear cavities are clear. Small left frontal scalp hematoma noted. CT MAXILLOFACIAL FINDINGS Osseous: No fracture or mandibular dislocation. No destructive process. Orbits: Ocular lenses have been removed. The orbits are otherwise unremarkable. Sinuses: The paranasal sinuses are clear. Soft tissues: Mild left preseptal and infraorbital soft tissue swelling. CT CERVICAL SPINE FINDINGS Alignment: 2 mm anterolisthesis of C3 upon C4 is unchanged. Skull base and vertebrae: Craniocervical alignment is normal. Atlantodental interval is not widened. No acute fracture the cervical spine. Vertebral body height has been preserved. Soft tissues and spinal canal: No prevertebral fluid or swelling. No visible canal hematoma. Disc levels: Intervertebral disc space narrowing and endplate remodeling at N9-8 and C6-7 is in keeping with changes of moderate degenerative disc disease. Minimal degenerative changes are seen throughout the remainder of the cervical spine. The prevertebral soft tissues are not thickened on sagittal reformats. Mild central canal stenosis at C3-4 secondary to anterolisthesis and associated posterior disc osteophyte complex. Review of the axial images  demonstrates multilevel uncovertebral and facet arthrosis resulting in moderate left and mild right neuroforaminal narrowing at C3-4 and C4-5. Other: None CT CHEST FINDINGS Cardiovascular: Moderate coronary artery calcification. Global cardiac size within normal limits. No pericardial effusion. Central pulmonary arteries are of normal caliber. The thoracic aorta is unremarkable. Left subclavian dual lead pacemaker is in place with leads within the right atrium and right ventricle. Left internal jugular chest port tip is seen within the superior  vena cava. Mediastinum/Nodes: No enlarged mediastinal, hilar, or axillary lymph nodes. Thyroid gland, trachea, and esophagus demonstrate no significant findings. Lungs/Pleura: Mild basilar predominant pulmonary fibrotic change. No focal pulmonary infiltrate. No pneumothorax or pleural effusion. No central obstructing mass. Musculoskeletal: An acute minimally displaced fracture of the left seventh rib is seen laterally. Multiple healed bilateral rib fractures are noted. CT ABDOMEN AND PELVIS FINDINGS Hepatobiliary: No focal liver abnormality is seen. Status post cholecystectomy. No biliary dilatation. Pancreas: Unremarkable Spleen: Unremarkable Adrenals/Urinary Tract: Adrenal glands are unremarkable. Status post right nephrectomy. Left kidney is normal in size and position. Stable probable hyperdense renal cyst arising from the lower pole of the left kidney. Left kidney is otherwise unremarkable. Bladder unremarkable. Stomach/Bowel: The stomach, small bowel, and large bowel are unremarkable. No free intraperitoneal gas or fluid. Vascular/Lymphatic: Aortic atherosclerosis. No enlarged abdominal or pelvic lymph nodes. Reproductive: Mild prostatic enlargement. Other: Tiny left fat containing inguinal hernia Musculoskeletal: No acute bone abnormality within the abdomen and pelvis. Osseous structures are age-appropriate. IMPRESSION: No acute intracranial injury. No calvarial  fracture. Small left frontal scalp hematoma. No acute facial fracture. Mild left preseptal and infraorbital soft tissue swelling. No acute fracture or listhesis of the cervical spine. Acute left seventh rib fracture.  No pneumothorax. No acute intra-abdominal pathology. Additional incidental findings as noted above. Aortic Atherosclerosis (ICD10-I70.0).  Fall Electronically Signed   By: Fidela Salisbury M.D.   On: 08/29/2021 02:56   CT Cervical Spine Wo Contrast  Result Date: 08/25/2021 CLINICAL DATA:  Fall with trauma to the face and forehead. EXAM: CT CERVICAL SPINE WITHOUT CONTRAST TECHNIQUE: Multidetector CT imaging of the cervical spine was performed without intravenous contrast. Multiplanar CT image reconstructions were also generated. COMPARISON:  10/28/2017 FINDINGS: Alignment: 2 mm degenerative anterolisthesis C3-4 and C4-5. Skull base and vertebrae: No regional fracture. Soft tissues and spinal canal: No sign of soft tissue injury. Disc levels: Foramen magnum widely patent. Ordinary osteoarthritis at the C1-2 articulation. C2-3: Bilateral facet arthropathy, new since the prior study. C3-4: Bilateral facet arthropathy, worsened since the previous study. 2 mm of anterolisthesis. Degenerative spondylosis. Bilateral foraminal stenosis. C4-5: Bilateral facet arthropathy, worsened since the previous study. 2 mm of anterolisthesis. Degenerative spondylosis. Bilateral foraminal stenosis. C5-6: Facet osteoarthritis on the right.  No stenosis. C6-7: Mild spondylosis.  No stenosis. C7-T1: Normal. Upper chest: Negative Other: None IMPRESSION: No acute traumatic finding. Since the study of 2019, the patient has developed facet arthropathy in the upper cervical spine as outlined above. Foraminal stenosis at C3-4 and C4-5 could be symptomatic. Electronically Signed   By: Nelson Chimes M.D.   On: 08/25/2021 13:58   CT CHEST ABDOMEN PELVIS W CONTRAST  Result Date: 08/29/2021 CLINICAL DATA:  Altered mental status,  Unwitnessed fall, left rib pain EXAM: CT HEAD WITHOUT CONTRAST CT MAXILLOFACIAL WITHOUT CONTRAST CT CERVICAL SPINE WITHOUT CONTRAST CT CHEST, ABDOMEN AND PELVIS WITH CONTRAST TECHNIQUE: Contiguous axial images were obtained from the base of the skull through the vertex without intravenous contrast. Multidetector CT imaging of the maxillofacial structures was performed. Multiplanar CT image reconstructions were also generated. A small metallic BB was placed on the right temple in order to reliably differentiate right from left. Multidetector CT imaging of the cervical spine was performed without intravenous contrast. Multiplanar CT image reconstructions were also generated. Multidetector CT imaging of the chest, abdomen and pelvis was performed following the standard protocol during bolus administration of intravenous contrast. CONTRAST:  12mL OMNIPAQUE IOHEXOL 300 MG/ML  SOLN COMPARISON:  08/25/2021, 07/29/2020 FINDINGS:  CT HEAD FINDINGS Brain: Normal anatomic configuration. Parenchymal volume loss is commensurate with the patient's age. Mild periventricular white matter changes are present likely reflecting the sequela of small vessel ischemia. No abnormal intra or extra-axial mass lesion or fluid collection. No abnormal mass effect or midline shift. No evidence of acute intracranial hemorrhage or infarct. Ventricular size is normal. Cerebellum unremarkable. Vascular: No asymmetric hyperdense vasculature at the skull base. Skull: Intact Other: Mastoid air cells and middle ear cavities are clear. Small left frontal scalp hematoma noted. CT MAXILLOFACIAL FINDINGS Osseous: No fracture or mandibular dislocation. No destructive process. Orbits: Ocular lenses have been removed. The orbits are otherwise unremarkable. Sinuses: The paranasal sinuses are clear. Soft tissues: Mild left preseptal and infraorbital soft tissue swelling. CT CERVICAL SPINE FINDINGS Alignment: 2 mm anterolisthesis of C3 upon C4 is unchanged. Skull  base and vertebrae: Craniocervical alignment is normal. Atlantodental interval is not widened. No acute fracture the cervical spine. Vertebral body height has been preserved. Soft tissues and spinal canal: No prevertebral fluid or swelling. No visible canal hematoma. Disc levels: Intervertebral disc space narrowing and endplate remodeling at V5-6 and C6-7 is in keeping with changes of moderate degenerative disc disease. Minimal degenerative changes are seen throughout the remainder of the cervical spine. The prevertebral soft tissues are not thickened on sagittal reformats. Mild central canal stenosis at C3-4 secondary to anterolisthesis and associated posterior disc osteophyte complex. Review of the axial images demonstrates multilevel uncovertebral and facet arthrosis resulting in moderate left and mild right neuroforaminal narrowing at C3-4 and C4-5. Other: None CT CHEST FINDINGS Cardiovascular: Moderate coronary artery calcification. Global cardiac size within normal limits. No pericardial effusion. Central pulmonary arteries are of normal caliber. The thoracic aorta is unremarkable. Left subclavian dual lead pacemaker is in place with leads within the right atrium and right ventricle. Left internal jugular chest port tip is seen within the superior vena cava. Mediastinum/Nodes: No enlarged mediastinal, hilar, or axillary lymph nodes. Thyroid gland, trachea, and esophagus demonstrate no significant findings. Lungs/Pleura: Mild basilar predominant pulmonary fibrotic change. No focal pulmonary infiltrate. No pneumothorax or pleural effusion. No central obstructing mass. Musculoskeletal: An acute minimally displaced fracture of the left seventh rib is seen laterally. Multiple healed bilateral rib fractures are noted. CT ABDOMEN AND PELVIS FINDINGS Hepatobiliary: No focal liver abnormality is seen. Status post cholecystectomy. No biliary dilatation. Pancreas: Unremarkable Spleen: Unremarkable Adrenals/Urinary Tract:  Adrenal glands are unremarkable. Status post right nephrectomy. Left kidney is normal in size and position. Stable probable hyperdense renal cyst arising from the lower pole of the left kidney. Left kidney is otherwise unremarkable. Bladder unremarkable. Stomach/Bowel: The stomach, small bowel, and large bowel are unremarkable. No free intraperitoneal gas or fluid. Vascular/Lymphatic: Aortic atherosclerosis. No enlarged abdominal or pelvic lymph nodes. Reproductive: Mild prostatic enlargement. Other: Tiny left fat containing inguinal hernia Musculoskeletal: No acute bone abnormality within the abdomen and pelvis. Osseous structures are age-appropriate. IMPRESSION: No acute intracranial injury. No calvarial fracture. Small left frontal scalp hematoma. No acute facial fracture. Mild left preseptal and infraorbital soft tissue swelling. No acute fracture or listhesis of the cervical spine. Acute left seventh rib fracture.  No pneumothorax. No acute intra-abdominal pathology. Additional incidental findings as noted above. Aortic Atherosclerosis (ICD10-I70.0).  Fall Electronically Signed   By: Fidela Salisbury M.D.   On: 08/29/2021 02:56   DG Chest Port 1 View  Result Date: 09/06/2021 CLINICAL DATA:  Checkup due to covid 19. Patient has known rib fractures. EXAM: PORTABLE CHEST 1 VIEW COMPARISON:  08/25/2021 and older exams.  CT, 08/29/2021. FINDINGS: Cardiac silhouette is normal in size. Stable dual lead left anterior chest wall pacemaker and right anterior chest wall Port-A-Cath. No mediastinal or hilar masses. Prominent vascular and interstitial markings bilaterally, similar to the prior chest radiograph. No lung consolidation. No convincing pleural effusion and no pneumothorax. Skeletal structures are demineralized. IMPRESSION: 1. No acute findings. No convincing pneumonia or pulmonary edema and no significant change from the prior chest radiograph. Electronically Signed   By: Lajean Manes M.D.   On: 09/06/2021  15:56   DG Hand Complete Left  Result Date: 08/25/2021 CLINICAL DATA:  Lost balance leading to fall on concrete try fight. Left hand pain and bruising. EXAM: LEFT HAND - COMPLETE 3+ VIEW COMPARISON:  None. FINDINGS: There is no evidence of fracture or dislocation. Moderate osteoarthritis at the thumb carpal metacarpal joint. Lesser osteoarthritis of the distal interphalangeal joints of the digits. No erosion or periosteal reaction. Mild soft tissue edema over the dorsum of the metacarpals. IMPRESSION: 1. No acute fracture or subluxation. 2. Osteoarthritis, most prominent at the thumb carpometacarpal joint. Electronically Signed   By: Keith Rake M.D.   On: 08/25/2021 15:36   CT Maxillofacial Wo Contrast  Result Date: 08/29/2021 CLINICAL DATA:  Altered mental status, Unwitnessed fall, left rib pain EXAM: CT HEAD WITHOUT CONTRAST CT MAXILLOFACIAL WITHOUT CONTRAST CT CERVICAL SPINE WITHOUT CONTRAST CT CHEST, ABDOMEN AND PELVIS WITH CONTRAST TECHNIQUE: Contiguous axial images were obtained from the base of the skull through the vertex without intravenous contrast. Multidetector CT imaging of the maxillofacial structures was performed. Multiplanar CT image reconstructions were also generated. A small metallic BB was placed on the right temple in order to reliably differentiate right from left. Multidetector CT imaging of the cervical spine was performed without intravenous contrast. Multiplanar CT image reconstructions were also generated. Multidetector CT imaging of the chest, abdomen and pelvis was performed following the standard protocol during bolus administration of intravenous contrast. CONTRAST:  145mL OMNIPAQUE IOHEXOL 300 MG/ML  SOLN COMPARISON:  08/25/2021, 07/29/2020 FINDINGS: CT HEAD FINDINGS Brain: Normal anatomic configuration. Parenchymal volume loss is commensurate with the patient's age. Mild periventricular white matter changes are present likely reflecting the sequela of small vessel  ischemia. No abnormal intra or extra-axial mass lesion or fluid collection. No abnormal mass effect or midline shift. No evidence of acute intracranial hemorrhage or infarct. Ventricular size is normal. Cerebellum unremarkable. Vascular: No asymmetric hyperdense vasculature at the skull base. Skull: Intact Other: Mastoid air cells and middle ear cavities are clear. Small left frontal scalp hematoma noted. CT MAXILLOFACIAL FINDINGS Osseous: No fracture or mandibular dislocation. No destructive process. Orbits: Ocular lenses have been removed. The orbits are otherwise unremarkable. Sinuses: The paranasal sinuses are clear. Soft tissues: Mild left preseptal and infraorbital soft tissue swelling. CT CERVICAL SPINE FINDINGS Alignment: 2 mm anterolisthesis of C3 upon C4 is unchanged. Skull base and vertebrae: Craniocervical alignment is normal. Atlantodental interval is not widened. No acute fracture the cervical spine. Vertebral body height has been preserved. Soft tissues and spinal canal: No prevertebral fluid or swelling. No visible canal hematoma. Disc levels: Intervertebral disc space narrowing and endplate remodeling at X4-1 and C6-7 is in keeping with changes of moderate degenerative disc disease. Minimal degenerative changes are seen throughout the remainder of the cervical spine. The prevertebral soft tissues are not thickened on sagittal reformats. Mild central canal stenosis at C3-4 secondary to anterolisthesis and associated posterior disc osteophyte complex. Review of the axial images demonstrates multilevel  uncovertebral and facet arthrosis resulting in moderate left and mild right neuroforaminal narrowing at C3-4 and C4-5. Other: None CT CHEST FINDINGS Cardiovascular: Moderate coronary artery calcification. Global cardiac size within normal limits. No pericardial effusion. Central pulmonary arteries are of normal caliber. The thoracic aorta is unremarkable. Left subclavian dual lead pacemaker is in place  with leads within the right atrium and right ventricle. Left internal jugular chest port tip is seen within the superior vena cava. Mediastinum/Nodes: No enlarged mediastinal, hilar, or axillary lymph nodes. Thyroid gland, trachea, and esophagus demonstrate no significant findings. Lungs/Pleura: Mild basilar predominant pulmonary fibrotic change. No focal pulmonary infiltrate. No pneumothorax or pleural effusion. No central obstructing mass. Musculoskeletal: An acute minimally displaced fracture of the left seventh rib is seen laterally. Multiple healed bilateral rib fractures are noted. CT ABDOMEN AND PELVIS FINDINGS Hepatobiliary: No focal liver abnormality is seen. Status post cholecystectomy. No biliary dilatation. Pancreas: Unremarkable Spleen: Unremarkable Adrenals/Urinary Tract: Adrenal glands are unremarkable. Status post right nephrectomy. Left kidney is normal in size and position. Stable probable hyperdense renal cyst arising from the lower pole of the left kidney. Left kidney is otherwise unremarkable. Bladder unremarkable. Stomach/Bowel: The stomach, small bowel, and large bowel are unremarkable. No free intraperitoneal gas or fluid. Vascular/Lymphatic: Aortic atherosclerosis. No enlarged abdominal or pelvic lymph nodes. Reproductive: Mild prostatic enlargement. Other: Tiny left fat containing inguinal hernia Musculoskeletal: No acute bone abnormality within the abdomen and pelvis. Osseous structures are age-appropriate. IMPRESSION: No acute intracranial injury. No calvarial fracture. Small left frontal scalp hematoma. No acute facial fracture. Mild left preseptal and infraorbital soft tissue swelling. No acute fracture or listhesis of the cervical spine. Acute left seventh rib fracture.  No pneumothorax. No acute intra-abdominal pathology. Additional incidental findings as noted above. Aortic Atherosclerosis (ICD10-I70.0).  Fall Electronically Signed   By: Fidela Salisbury M.D.   On: 08/29/2021 02:56    CT Maxillofacial Wo Contrast  Result Date: 08/25/2021 CLINICAL DATA:  Fall after losing balance. EXAM: CT MAXILLOFACIAL WITHOUT CONTRAST TECHNIQUE: Multidetector CT imaging of the maxillofacial structures was performed. Multiplanar CT image reconstructions were also generated. COMPARISON:  None. FINDINGS: Osseous: No facial fracture. Orbits: No intraorbital injury. Periorbital hematoma superficially, left more than right. Sinuses: Clear.  No traumatic fluid. Soft tissues: Superficial hematoma in the periorbital regions left more than right and the forehead. Limited intracranial: Negative IMPRESSION: No facial fracture.  Superficial hematoma as above. Electronically Signed   By: Nelson Chimes M.D.   On: 08/25/2021 13:55    Microbiology: Recent Results (from the past 240 hour(s))  Resp Panel by RT-PCR (Flu A&B, Covid) Nasopharyngeal Swab     Status: Abnormal   Collection Time: 09/03/21  2:22 PM   Specimen: Nasopharyngeal Swab; Nasopharyngeal(NP) swabs in vial transport medium  Result Value Ref Range Status   SARS Coronavirus 2 by RT PCR POSITIVE (A) NEGATIVE Final    Comment: RESULT CALLED TO, READ BACK BY AND VERIFIED WITH: KRISTAN DAVIS 09/03/21 1551 MU  (NOTE) SARS-CoV-2 target nucleic acids are DETECTED.  The SARS-CoV-2 RNA is generally detectable in upper respiratory specimens during the acute phase of infection. Positive results are indicative of the presence of the identified virus, but do not rule out bacterial infection or co-infection with other pathogens not detected by the test. Clinical correlation with patient history and other diagnostic information is necessary to determine patient infection status. The expected result is Negative.  Fact Sheet for Patients: EntrepreneurPulse.com.au  Fact Sheet for Healthcare Providers: IncredibleEmployment.be  This test is  not yet approved or cleared by the [place] and  has been authorized  for detection and/or diagnosis of SARS-CoV-2 by FDA under an Emergency Use Authorization (EUA).  This EUA will remain in effect (meaning this test can be u sed) for the duration of  the COVID-19 declaration under Section 564(b)(1) of the Act, 21 U.S.C. section 360bbb-3(b)(1), unless the authorization is terminated or revoked sooner.     Influenza A by PCR NEGATIVE NEGATIVE Final   Influenza B by PCR NEGATIVE NEGATIVE Final    Comment: (NOTE) The Xpert Xpress SARS-CoV-2/FLU/RSV plus assay is intended as an aid in the diagnosis of influenza from Nasopharyngeal swab specimens and should not be used as a sole basis for treatment. Nasal washings and aspirates are unacceptable for Xpert Xpress SARS-CoV-2/FLU/RSV testing.  Fact Sheet for Patients: EntrepreneurPulse.com.au  Fact Sheet for Healthcare Providers: IncredibleEmployment.be  This test is not yet approved or cleared by the [place] FDA and has been authorized for detection and/or diagnosis of SARS-CoV-2 by FDA under an Emergency Use Authorization (EUA). This EUA will remain in effect (meaning this test can be used) for the duration of the COVID-19 declaration under Section 564(b)(1) of the Act, 21 U.S.C. section 360bbb-3(b)(1), unless the authorization is terminated or revoked.  Performed at [facility], [address] [place], [place] [address]   Resp Panel by RT-PCR (Flu A&B, Covid) Nasopharyngeal Swab     Status: Abnormal   Collection Time: 09/10/21  4:00 PM   Specimen: Nasopharyngeal Swab; Nasopharyngeal(NP) swabs in vial transport medium  Result Value Ref Range Status   SARS Coronavirus 2 by RT PCR POSITIVE (A) NEGATIVE Final    Comment: RESULT CALLED TO, READ BACK BY AND VERIFIED WITH: J DORORTHY @ [id number] 09/10/21 LFD (NOTE) SARS-CoV-2 target nucleic acids are DETECTED.  The SARS-CoV-2 RNA is generally detectable in upper respiratory specimens during the acute phase  of infection. Positive results are indicative of the presence of the identified virus, but do not rule out bacterial infection or co-infection with other pathogens not detected by the test. Clinical correlation with patient history and other diagnostic information is necessary to determine patient infection status. The expected result is Negative.  Fact Sheet for Patients: EntrepreneurPulse.com.au  Fact Sheet for Healthcare Providers: IncredibleEmployment.be  This test is not yet approved or cleared by the [place] FDA and  has been authorized for detection and/or diagnosis of SARS-CoV-2 by FDA under an Emergency Use Authorization (EUA).  This EUA will remain in effect (meaning this test can be [place] ed) for the duration of  the COVID-19 declaration under Section 564(b)(1) of the Act, 21 U.S.C. section 360bbb-3(b)(1), unless the authorization is terminated or revoked sooner.     Influenza A by PCR NEGATIVE NEGATIVE Final   Influenza B by PCR NEGATIVE NEGATIVE Final    Comment: (NOTE) The Xpert Xpress SARS-CoV-2/FLU/RSV plus assay is intended as an aid in the diagnosis of influenza from Nasopharyngeal swab specimens and should not be used as a sole basis for treatment. Nasal washings and aspirates are unacceptable for Xpert Xpress SARS-CoV-2/FLU/RSV testing.  Fact Sheet for Patients: EntrepreneurPulse.com.au  Fact Sheet for Healthcare Providers: IncredibleEmployment.be  This test is not yet approved or cleared by the [place] FDA and has been authorized for detection and/or diagnosis of SARS-CoV-2 by FDA under an Emergency Use Authorization (EUA). This EUA will remain in effect (meaning this test can be used) for the duration of the COVID-19 declaration under Section 564(b)(1) of the  Act, 21 U.S.C. section 360bbb-3(b)(1), unless the authorization is terminated or revoked.  Performed at [facility], [address] [place], [place] [address]      Labs: Basic Metabolic Panel: Recent Labs  Lab 09/07/21 0504 09/13/21 0608  NA 135 135  K 4.4 4.3  CL 96* 95*  CO2 30 32  GLUCOSE 126* 160*  BUN 32* 29*  CREATININE 1.35* 1.51*  CALCIUM 8.2* 8.4*  MG 2.2  --    Liver Function Tests: No results for input(s): AST, ALT, ALKPHOS, BILITOT, PROT, ALBUMIN in the last 168 hours. No results for input(s): LIPASE, AMYLASE in the last 168 hours. No results for input(s): AMMONIA in the last 168 hours. CBC: Recent Labs  Lab 09/07/21 0504 09/13/21 0608  WBC 2.9* 2.8*  NEUTROABS  --  1.2*  HGB 14.2 13.5  HCT 42.9 40.8  MCV 90.9 91.3  PLT 103* 122*   Cardiac Enzymes: No results for input(s): CKTOTAL, CKMB, CKMBINDEX, TROPONINI in the last 168 hours. BNP: BNP (last 3 results) Recent Labs    08/29/21 0020  BNP 190.9*    ProBNP (last 3 results) No results for input(s): PROBNP in the last 8760 hours.  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

## 2021-09-13 NOTE — Progress Notes (Addendum)
Attempted to call report for patient to [facility]. No answer. I will try again. Waiting on EMS for transportation. Gary [name]

## 2021-09-13 NOTE — TOC Transition Note (Signed)
Transition of Care [facility]) - CM/SW Discharge Note   Patient Details  Name: Gary [name] MRN: [id number] Date of Birth: 1946/07/02  [facility]) CM/SW Contact:  Harriet Masson, RN Phone Number:[phone] 09/13/2021, 1:57 PM   Clinical Narrative:    [facility] ([facility]am[facility]) relied that pt can be admitted for SNF placement. Spoke with sister Manuela Schwartz, pt and bedside nurse all aware of the bed availability. Pt will go to room #608 (Michael-bedside nurse) at [facility] and [facility] services hs been called for transportation.  TOC remains available with no other needs to address at this time.   Final next level of care: Skilled Nursing Facility Barriers to Discharge: No Barriers Identified   Patient Goals and CMS Choice Patient states their goals for this hospitalization and ongoing recovery are:: To return home do not want SNF CMS Medicare.gov Compare Post Acute Care list provided to:: Patient Choice offered to / list presented to : Patient, Sibling  Discharge Placement                Patient to be transferred to facility by: EMS Name of family member notified: [name] (left voice message) Patient and family notified of of transfer: 09/13/21 (Left vm with sister Manuela Schwartz)  Discharge Plan and Services In-house Referral: Clinical Social Work Discharge Planning Services: CM Consult Post Acute Care Choice: [place]          DME Arranged: N/A DME Agency: NA       HH Arranged: NA [employer] Agency: NA        Social Determinants of Health ([place]) Interventions     Readmission Risk Interventions Readmission Risk Prevention Plan 08/30/2021  Transportation Screening Complete  PCP or Specialist Appt within 3-5 Days Complete  HRI or [place] Complete  Social Work Consult for [place] Planning/Counseling Complete  Palliative Care Screening Not Applicable  Medication Review Press photographer) Complete  Some recent data might be hidden

## 2021-09-13 NOTE — Progress Notes (Signed)
Called [facility] to give report.

## 2021-09-15 DIAGNOSIS — I10 Essential (primary) hypertension: Secondary | ICD-10-CM | POA: Diagnosis not present

## 2021-09-15 DIAGNOSIS — K5900 Constipation, unspecified: Secondary | ICD-10-CM | POA: Diagnosis not present

## 2021-09-15 DIAGNOSIS — E7849 Other hyperlipidemia: Secondary | ICD-10-CM | POA: Diagnosis not present

## 2021-09-15 DIAGNOSIS — S2232XD Fracture of one rib, left side, subsequent encounter for fracture with routine healing: Secondary | ICD-10-CM | POA: Diagnosis not present

## 2021-09-15 DIAGNOSIS — E039 Hypothyroidism, unspecified: Secondary | ICD-10-CM | POA: Diagnosis not present

## 2021-09-15 DIAGNOSIS — M6281 Muscle weakness (generalized): Secondary | ICD-10-CM | POA: Diagnosis not present

## 2021-09-15 DIAGNOSIS — F32A Depression, unspecified: Secondary | ICD-10-CM | POA: Diagnosis not present

## 2021-09-15 DIAGNOSIS — E1022 Type 1 diabetes mellitus with diabetic chronic kidney disease: Secondary | ICD-10-CM | POA: Diagnosis not present

## 2021-09-15 DIAGNOSIS — E569 Vitamin deficiency, unspecified: Secondary | ICD-10-CM | POA: Diagnosis not present

## 2021-09-17 ENCOUNTER — Ambulatory Visit (INDEPENDENT_AMBULATORY_CARE_PROVIDER_SITE_OTHER): Payer: Medicare Other | Admitting: Nurse Practitioner

## 2021-09-17 ENCOUNTER — Encounter (INDEPENDENT_AMBULATORY_CARE_PROVIDER_SITE_OTHER): Payer: Self-pay

## 2021-09-17 ENCOUNTER — Telehealth: Payer: Medicare Other

## 2021-09-17 ENCOUNTER — Telehealth: Payer: Self-pay | Admitting: *Deleted

## 2021-09-17 ENCOUNTER — Other Ambulatory Visit: Payer: Self-pay

## 2021-09-17 VITALS — BP 132/64 | HR 90 | Resp 16 | Wt 206.6 lb

## 2021-09-17 DIAGNOSIS — I890 Lymphedema, not elsewhere classified: Secondary | ICD-10-CM | POA: Diagnosis not present

## 2021-09-17 NOTE — Telephone Encounter (Signed)
  Care Management   Follow Up Note   09/17/2021 Name: Gary [name] MRN: [id number] DOB: 11/03/1945   Referred by: [name], MD Reason for referral : Chronic Care Management (DM, CHF)   Upon chart review, patient recently hospitalized for fall and hypoglycemia.;  recently discharged to [facility].  Spoke with Inez Catalina at [facility] was she confirms patient still there.  Follow Up Plan: The care management team will reach out to the patient again over the next 30 days.   [name] RN, MSN RN Care Management Coordinator [place] [phone] [email or web address]

## 2021-09-17 NOTE — Progress Notes (Signed)
History of Present Illness  There is no documented history at this time  Assessments & Plan   There are no diagnoses linked to this encounter.    Additional instructions  Subjective:  Patient presents with venous ulcer of the Bilateral lower extremity.    Procedure:  3 layer unna wrap was placed Bilateral lower extremity.   Plan:   Follow up in one week.  

## 2021-09-18 DIAGNOSIS — E569 Vitamin deficiency, unspecified: Secondary | ICD-10-CM | POA: Diagnosis not present

## 2021-09-18 DIAGNOSIS — E1022 Type 1 diabetes mellitus with diabetic chronic kidney disease: Secondary | ICD-10-CM | POA: Diagnosis not present

## 2021-09-18 DIAGNOSIS — E7849 Other hyperlipidemia: Secondary | ICD-10-CM | POA: Diagnosis not present

## 2021-09-21 ENCOUNTER — Encounter (INDEPENDENT_AMBULATORY_CARE_PROVIDER_SITE_OTHER): Payer: Self-pay | Admitting: Nurse Practitioner

## 2021-09-23 DIAGNOSIS — E1022 Type 1 diabetes mellitus with diabetic chronic kidney disease: Secondary | ICD-10-CM | POA: Diagnosis not present

## 2021-09-24 ENCOUNTER — Ambulatory Visit (INDEPENDENT_AMBULATORY_CARE_PROVIDER_SITE_OTHER): Payer: Medicare Other | Admitting: Nurse Practitioner

## 2021-09-24 ENCOUNTER — Other Ambulatory Visit: Payer: Self-pay

## 2021-09-24 VITALS — BP 133/72 | HR 83 | Ht 71.0 in | Wt 209.0 lb

## 2021-09-24 DIAGNOSIS — I890 Lymphedema, not elsewhere classified: Secondary | ICD-10-CM | POA: Diagnosis not present

## 2021-09-24 NOTE — Progress Notes (Signed)
History of Present Illness  There is no documented history at this time  Assessments & Plan   There are no diagnoses linked to this encounter.    Additional instructions  Subjective:  Patient presents with venous ulcer of the Bilateral lower extremity.    Procedure:  3 layer unna wrap was placed Bilateral lower extremity.   Plan:   Follow up in one week.  

## 2021-09-25 DIAGNOSIS — J449 Chronic obstructive pulmonary disease, unspecified: Secondary | ICD-10-CM | POA: Diagnosis not present

## 2021-09-25 DIAGNOSIS — E1022 Type 1 diabetes mellitus with diabetic chronic kidney disease: Secondary | ICD-10-CM | POA: Diagnosis not present

## 2021-09-26 DIAGNOSIS — E7849 Other hyperlipidemia: Secondary | ICD-10-CM | POA: Diagnosis not present

## 2021-09-26 DIAGNOSIS — E1022 Type 1 diabetes mellitus with diabetic chronic kidney disease: Secondary | ICD-10-CM | POA: Diagnosis not present

## 2021-09-26 DIAGNOSIS — E569 Vitamin deficiency, unspecified: Secondary | ICD-10-CM | POA: Diagnosis not present

## 2021-09-26 DIAGNOSIS — I10 Essential (primary) hypertension: Secondary | ICD-10-CM | POA: Diagnosis not present

## 2021-09-26 DIAGNOSIS — E039 Hypothyroidism, unspecified: Secondary | ICD-10-CM | POA: Diagnosis not present

## 2021-09-26 DIAGNOSIS — M6281 Muscle weakness (generalized): Secondary | ICD-10-CM | POA: Diagnosis not present

## 2021-09-26 DIAGNOSIS — F32A Depression, unspecified: Secondary | ICD-10-CM | POA: Diagnosis not present

## 2021-09-26 DIAGNOSIS — S2232XD Fracture of one rib, left side, subsequent encounter for fracture with routine healing: Secondary | ICD-10-CM | POA: Diagnosis not present

## 2021-09-29 ENCOUNTER — Other Ambulatory Visit: Payer: Self-pay | Admitting: Internal Medicine

## 2021-09-29 ENCOUNTER — Encounter (INDEPENDENT_AMBULATORY_CARE_PROVIDER_SITE_OTHER): Payer: Self-pay | Admitting: Nurse Practitioner

## 2021-09-30 ENCOUNTER — Inpatient Hospital Stay: Payer: Medicare Other | Attending: Oncology

## 2021-09-30 ENCOUNTER — Other Ambulatory Visit: Payer: Self-pay

## 2021-09-30 DIAGNOSIS — H04123 Dry eye syndrome of bilateral lacrimal glands: Secondary | ICD-10-CM | POA: Diagnosis not present

## 2021-09-30 DIAGNOSIS — D509 Iron deficiency anemia, unspecified: Secondary | ICD-10-CM | POA: Diagnosis not present

## 2021-09-30 DIAGNOSIS — Z95828 Presence of other vascular implants and grafts: Secondary | ICD-10-CM

## 2021-09-30 DIAGNOSIS — C8310 Mantle cell lymphoma, unspecified site: Secondary | ICD-10-CM | POA: Diagnosis not present

## 2021-09-30 DIAGNOSIS — E785 Hyperlipidemia, unspecified: Secondary | ICD-10-CM | POA: Diagnosis not present

## 2021-09-30 DIAGNOSIS — E1065 Type 1 diabetes mellitus with hyperglycemia: Secondary | ICD-10-CM | POA: Diagnosis not present

## 2021-09-30 DIAGNOSIS — I2510 Atherosclerotic heart disease of native coronary artery without angina pectoris: Secondary | ICD-10-CM | POA: Diagnosis not present

## 2021-09-30 DIAGNOSIS — J439 Emphysema, unspecified: Secondary | ICD-10-CM | POA: Diagnosis not present

## 2021-09-30 DIAGNOSIS — E569 Vitamin deficiency, unspecified: Secondary | ICD-10-CM | POA: Diagnosis not present

## 2021-09-30 DIAGNOSIS — E1051 Type 1 diabetes mellitus with diabetic peripheral angiopathy without gangrene: Secondary | ICD-10-CM | POA: Diagnosis not present

## 2021-09-30 DIAGNOSIS — S2232XD Fracture of one rib, left side, subsequent encounter for fracture with routine healing: Secondary | ICD-10-CM | POA: Diagnosis not present

## 2021-09-30 DIAGNOSIS — E1043 Type 1 diabetes mellitus with diabetic autonomic (poly)neuropathy: Secondary | ICD-10-CM | POA: Diagnosis not present

## 2021-09-30 DIAGNOSIS — Z9181 History of falling: Secondary | ICD-10-CM | POA: Diagnosis not present

## 2021-09-30 DIAGNOSIS — K3184 Gastroparesis: Secondary | ICD-10-CM | POA: Diagnosis not present

## 2021-09-30 DIAGNOSIS — F32A Depression, unspecified: Secondary | ICD-10-CM | POA: Diagnosis not present

## 2021-09-30 DIAGNOSIS — E1042 Type 1 diabetes mellitus with diabetic polyneuropathy: Secondary | ICD-10-CM | POA: Diagnosis not present

## 2021-09-30 DIAGNOSIS — I872 Venous insufficiency (chronic) (peripheral): Secondary | ICD-10-CM | POA: Diagnosis not present

## 2021-09-30 DIAGNOSIS — M6281 Muscle weakness (generalized): Secondary | ICD-10-CM | POA: Diagnosis not present

## 2021-09-30 DIAGNOSIS — I890 Lymphedema, not elsewhere classified: Secondary | ICD-10-CM | POA: Diagnosis not present

## 2021-09-30 DIAGNOSIS — E0500 Thyrotoxicosis with diffuse goiter without thyrotoxic crisis or storm: Secondary | ICD-10-CM | POA: Diagnosis not present

## 2021-09-30 DIAGNOSIS — I130 Hypertensive heart and chronic kidney disease with heart failure and stage 1 through stage 4 chronic kidney disease, or unspecified chronic kidney disease: Secondary | ICD-10-CM | POA: Diagnosis not present

## 2021-09-30 DIAGNOSIS — E10649 Type 1 diabetes mellitus with hypoglycemia without coma: Secondary | ICD-10-CM | POA: Diagnosis not present

## 2021-09-30 DIAGNOSIS — E039 Hypothyroidism, unspecified: Secondary | ICD-10-CM | POA: Diagnosis not present

## 2021-09-30 DIAGNOSIS — N1831 Chronic kidney disease, stage 3a: Secondary | ICD-10-CM | POA: Diagnosis not present

## 2021-09-30 DIAGNOSIS — E103519 Type 1 diabetes mellitus with proliferative diabetic retinopathy with macular edema, unspecified eye: Secondary | ICD-10-CM | POA: Diagnosis not present

## 2021-09-30 DIAGNOSIS — I5022 Chronic systolic (congestive) heart failure: Secondary | ICD-10-CM | POA: Diagnosis not present

## 2021-09-30 DIAGNOSIS — E1022 Type 1 diabetes mellitus with diabetic chronic kidney disease: Secondary | ICD-10-CM | POA: Diagnosis not present

## 2021-09-30 MED ORDER — SODIUM CHLORIDE 0.9% FLUSH
10.0000 mL | Freq: Once | INTRAVENOUS | Status: AC
  Administered 2021-09-30: 15:00:00 10 mL via INTRAVENOUS
  Filled 2021-09-30: qty 10

## 2021-09-30 MED ORDER — HEPARIN SOD (PORK) LOCK FLUSH 100 UNIT/ML IV SOLN
500.0000 [IU] | Freq: Once | INTRAVENOUS | Status: AC
  Administered 2021-09-30: 15:00:00 500 [IU] via INTRAVENOUS
  Filled 2021-09-30: qty 5

## 2021-10-01 ENCOUNTER — Encounter (INDEPENDENT_AMBULATORY_CARE_PROVIDER_SITE_OTHER): Payer: Self-pay

## 2021-10-01 ENCOUNTER — Ambulatory Visit (INDEPENDENT_AMBULATORY_CARE_PROVIDER_SITE_OTHER): Payer: Medicare Other | Admitting: *Deleted

## 2021-10-01 ENCOUNTER — Ambulatory Visit (INDEPENDENT_AMBULATORY_CARE_PROVIDER_SITE_OTHER): Payer: Medicare Other | Admitting: Nurse Practitioner

## 2021-10-01 VITALS — BP 97/63 | HR 99 | Resp 16 | Wt 216.0 lb

## 2021-10-01 DIAGNOSIS — I890 Lymphedema, not elsewhere classified: Secondary | ICD-10-CM

## 2021-10-01 DIAGNOSIS — E1151 Type 2 diabetes mellitus with diabetic peripheral angiopathy without gangrene: Secondary | ICD-10-CM

## 2021-10-01 DIAGNOSIS — I5022 Chronic systolic (congestive) heart failure: Secondary | ICD-10-CM

## 2021-10-01 DIAGNOSIS — R296 Repeated falls: Secondary | ICD-10-CM

## 2021-10-01 NOTE — Progress Notes (Signed)
History of Present Illness  There is no documented history at this time  Assessments & Plan   There are no diagnoses linked to this encounter.    Additional instructions  Subjective:  Patient presents with venous ulcer of the Bilateral lower extremity.    Procedure:  3 layer unna wrap was placed Bilateral lower extremity.   Plan:   Follow up in one week.  

## 2021-10-01 NOTE — Patient Instructions (Addendum)
Visit Information  Thank you for taking time to visit with me today. Please don't hesitate to contact me if I can be of assistance to you before our next scheduled telephone appointment.  Following are the goals we discussed today:  Check blood sugar at least 5 times a day Check blood sugar if I feel it is too high or too low Take the blood sugar meter and log to all doctor visits  Consider drinking Boost or other supplement in the evenings if not eating a complete meal Make sure to have candy or glucose tablets on you at all times; try drinking coke along with eating snack to treat hypoglycemia Eat at least 3 meals a day with snack in between to help prevent hypoglycemia Please consider/participate with Home Health Nursing/Therapy involvement Fall precautions and preventions; Wear life alert at all times Contact Endocrinology for reinstating insulin pump Weigh self daily using walker to balance Low salt carb modified diet  Our next appointment is by telephone on 11/10/20 at 1445  Please call the care guide team at [phone] if you need to cancel or reschedule your appointment.   If you are experiencing a Mental Health or [place] or need someone to talk to, please call the Suicide and Crisis Lifeline: 988 call the [place] National Suicide Prevention Lifeline: 571 702 0301 or TTY: 5312758028 TTY (603)280-7513) to talk to a trained counselor call 1-800-273-TALK (toll free, 24 hour hotline) go to [facility] Urgent Care [address], [place] [phone]) call 911   Patient verbalizes understanding of instructions provided today and agrees to view in [place].   [name] RN, MSN RN Care Management Coordinator [place] [phone] [email or web address]

## 2021-10-03 NOTE — Chronic Care Management (AMB) (Signed)
Chronic Care Management   CCM RN Visit Note  10/03/2021 Name: Gary [name] MRN: [id number] DOB: 03/19/1946  Subjective: Gary [name] is a 75 y.o. year old male who is a primary care patient of [name], MD. The care management team was consulted for assistance with disease management and care coordination needs.    Engaged with patient by telephone for follow up visit in response to provider referral for case management and/or care coordination services.   Consent to Services:  The patient was given information about Chronic Care Management services, agreed to services, and gave verbal consent prior to initiation of services.  Please see initial visit note for detailed documentation.   Patient agreed to services and verbal consent obtained.   Assessment: Review of patient past medical history, allergies, medications, health status, including review of consultants reports, laboratory and other test data, was performed as part of comprehensive evaluation and provision of chronic care management services.   SDOH (Social Determinants of Health) assessments and interventions performed:    CCM Care Plan  Allergies  Allergen Reactions   Rofecoxib Nausea Only    Outpatient Encounter Medications as of 10/01/2021  Medication Sig   acetaminophen (TYLENOL) 325 MG tablet Take 650 mg by mouth every 6 (six) hours as needed.   bacitracin 500 UNIT/GM ointment Apply 1 application topically daily.   carvedilol (COREG) 3.125 MG tablet Take 1 tablet (3.125 mg total) by mouth 2 (two) times daily with a meal. Hold if systolic <287 and HR <86   Cholecalciferol (VITAMIN D3) 50 MCG (2000 UT) CAPS Take 2,000 Units by mouth daily.   feeding supplement, GLUCERNA SHAKE, (GLUCERNA SHAKE) LIQD Take 237 mLs by mouth 2 (two) times daily between meals.   ferrous sulfate 325 (65 FE) MG tablet Take 325 mg by mouth daily with breakfast.   FLUoxetine (PROZAC) 20 MG capsule Take 1 capsule (20 mg total) by mouth  daily.   furosemide (LASIX) 40 MG tablet Take 1 tablet (40 mg total) by mouth daily.   glucose 4 GM chewable tablet Chew 1 tablet (4 g total) by mouth once as needed for low blood sugar.   insulin aspart (NOVOLOG) 100 UNIT/ML injection Inject 0-9 Units into the skin 3 (three) times daily with meals. CBG 70 - 120: 0 units CBG 121 - 150: 1 unit CBG 151 - 200: 2 units CBG 201 - 250: 3 units CBG 251 - 300: 5 units CBG 301 - 350: 7 units CBG 351 - 400: 9 units   insulin aspart (NOVOLOG) 100 UNIT/ML injection Inject 0-5 Units into the skin at bedtime. CBG 70 - 120: 0 units CBG 121 - 150: 0 units CBG 151 - 200: 0 units CBG 201 - 250: 2 units CBG 251 - 300: 3 units CBG 301 - 350: 4 units CBG 351 - 400: 5 units   insulin aspart (NOVOLOG) 100 UNIT/ML injection Inject 6 Units into the skin 3 (three) times daily with meals.   insulin glargine-yfgn (SEMGLEE) 100 UNIT/ML injection Inject 0.25 mLs (25 Units total) into the skin daily. (Patient taking differently: Inject 16 Units into the skin daily.)   levothyroxine (SYNTHROID) 25 MCG tablet Take 25 mcg by mouth daily before breakfast.   lidocaine (LIDODERM) 5 % Place 1 patch onto the skin daily. Remove & Discard patch within 12 hours or as directed by MD   methocarbamol (ROBAXIN) 750 MG tablet Take 1 tablet (750 mg total) by mouth every 6 (six) hours as needed for muscle  spasms.   polyethylene glycol (MIRALAX / GLYCOLAX) 17 g packet Take 17 g by mouth 2 (two) times daily.   polyvinyl alcohol (LIQUIFILM TEARS) 1.4 % ophthalmic solution Place 1 drop into both eyes as needed for dry eyes.   pravastatin (PRAVACHOL) 40 MG tablet Take 40 mg by mouth daily.    vitamin B-12 (CYANOCOBALAMIN) 1000 MCG tablet Take 1,000 mcg by mouth daily.   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    Left rib fracture_7th rib    Leukocytosis    HTN (hypertension)    Hypothyroid    CAD (coronary artery disease)    Porokeratosis 05/15/2021   Aortic atherosclerosis ([place]) 05/05/2021   Emphysema lung ([place]) 05/05/2021   Thrombocytopenia ([place]) 04/29/2021   Splinter of foot without infection 02/23/2021   Arm laceration 02/09/2021   Hypokalemia 10/24/2020   Injury by nail 08/15/2020   Shingles 07/24/2020   Rash 07/02/2020   Colon cancer screening 07/02/2020   Sick sinus syndrome ([place]) 12/28/2019   Acquired absence of kidney 09/13/2019   Edema of lower extremity 09/13/2019   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, or unspecified 09/13/2019   Proteinuria 09/13/2019   Lower extremity pain, bilateral    Diabetic foot ulcer associated with type 1 diabetes mellitus ([place])    Acute on chronic systolic CHF (congestive heart failure) ([place])    Cellulitis 08/18/2019   Lower limb ulcer, calf ([place]) 08/01/2019   Left hip pain 07/16/2019   Pain due to onychomycosis of toenails of both feet 04/03/2019   Swelling of limb 02/07/2019   Pancytopenia ([place]) 12/03/2018   CKD (chronic kidney disease) stage 3, GFR 30-59 ml/min (HCC) 09/16/2018   Left shoulder pain 09/16/2018   Chronic cholecystitis 12/29/2017   Graves disease 12/29/2017   Peripheral neuropathy 12/29/2017   S/p nephrectomy 12/29/2017   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   Symptomatic bradycardia 12/02/2017   CKD (chronic kidney disease), stage IIIa 58/85/0277   Chronic systolic CHF (congestive heart failure) ([place]) 12/02/2017   Saturday night paralysis 11/12/2017   Hypoglycemia 10/28/2017   Goals of care, counseling/discussion 10/24/2017   Lymphedema 10/20/2017   Iron deficiency anemia 09/11/2017   Fall 08/13/2017   Humerus fracture 08/13/2017   Anemia  01/12/2017   Bilateral carotid artery stenosis 06/29/2016   Hand laceration 12/16/2015   Adjustment disorder with depressed mood 08/11/2015   Cardiomyopathy, idiopathic ([place]) 04/22/2015   CAD in native artery 11/02/2014   Mantle cell lymphoma ([place]) 08/03/2014   History of colonic polyps 04/29/2014   Irregular heart beat 04/22/2014   SOB (shortness of breath) 04/22/2014   Stress 03/21/2014   PVC (premature ventricular contraction) 02/21/2014   GERD (gastroesophageal reflux disease) 10/23/2013   Gastroparesis 06/25/2013   Chest pain 04/13/2013   Thyroid disease 04/13/2013   Diabetes ([place]) 04/13/2013   Essential hypertension, benign 04/13/2013   Hypercholesterolemia 04/13/2013    Conditions to be addressed/monitored:CHF and DMII  Care Plan : Diabetes  Updates made by Leona Singleton, RN since 10/03/2021 12:00 AM     Problem: Hypoglycemia causing syncopal episodes   Priority: Medium     Long-Range Goal: Patient  will report maintaing Hgb A1C of 7 or below in the next 90 days   Start Date: 01/27/2021  Expected End Date: 02/08/2022  This Visit's Progress: Not on track  Recent Progress: On track  Priority: Medium  Note:   Objective:  Lab Results  Component Value Date   HGBA1C 7.0 01/23/2021  Current Barriers:  Knowledge Deficits related to basic Diabetes pathophysiology and self care/management as evidenced by multiple syncopal episodes related to hypoglycemia.  Latest Hgb A1C 6.9 on 04/29/21.  Just discharged from SNF on Monday from  recent hospitalization for fall and hypoglycemia.  States he turned his insulin pump back on when he returned home.  Reports blood sugars have ranged 100-140's.  Denies any hypoglycemia since being home.  Instructed to contact Endocrnologist as soon as possible.  Last weight was 216 pounds.  Does report the home health agency has made contact (unsure  of the name), he is just waiting for them to come out. Limited Social Support Trouble getting  insulin pump supplies-reports he now has insulin pump sensors Case Manager Clinical Goal(s):  patient will demonstrate improved adherence to prescribed treatment plan for diabetes self care/management as evidenced by: at least 4 times a day monitoring and recording of CBG, adherence to ADA/ carb modified diet, adherence to prescribed medication regimen, contacting provider for new or worsened symptoms or questions  Diabetes Interventions:  (Status: Goal on track: NO.)  Long Term Goal  Collaboration with [name], MD regarding development and update of comprehensive plan of care as evidenced by provider attestation and co-signature Inter-disciplinary care team collaboration (see longitudinal plan of care) Provided education to patient about basic DM disease process Discussed medications and encouraged medication compliance Discussed plans with patient for ongoing care management follow up and provided patient with direct contact information for care management team Encouraged continued participation with CCM Pharmacist  Reviewed glucose ranges and hypoglycemic events; and discussed dangers of hypoglycemia and treatment options, need for immediate sugar replacement with juice, sugar, soda in addition to snack or candy bar or glucose tablets Discussed proper treatment of hypoglycemia Discussed healthy meal and snack options Rediscussed possible Home Health Nursing or Therapy referral for safety evaluation after 2 falls, educating and discussing even if they start services, patient can cancel at any time (states he has spoken with them and awaiting them to comr out) Discussed and encouraged patient to be proactive with health instead of reactive after falls Fall precautions and preventions reviewed and discussed, encouraged to use walker with all ambulation,  Instructed and encouraged to wear life alert at all times Discussed logging blood sugars and writing down insulin doses taken to help  provider manage medications; sent 2022 Calendar Booklet to help with keeping log of blood sugars and insulin dose Reviewed and discussed proper diabetic foot care, encouraged patient to keep check on right foot splinter wound for signs and symptoms of infection Discussed and encouraged and reinforced patient to wear bedroom/house/slides while in the home and not to walk around bare foot Discussed with patient asking podiatrist for prescription for diabetic shoes Encouraged to contact Endocrinology ASAP since turning pump back on   Heart Failure Interventions:  (Status: Goal on track: NO.)  Long Term Goal  Basic overview and discussion of pathophysiology of Heart Failure reviewed Provided education on low sodium diet Reviewed Heart Failure Action Plan in depth and provided written copy Discussed importance of daily weight and advised patient to weigh and record daily Reviewed role of diuretics in prevention  of fluid overload and management of heart failure Discussed the importance of keeping all appointments with provider Provided patient with education about the role of exercise in the management of heart failure  Use walker to help stand on scale Fall precautions and preventions Patient Goals/Self-Care Activities Check blood sugar at least 5 times a day Check blood sugar if I feel it is too high or too low Take the blood sugar meter and log to all doctor visits  Consider drinking Boost or other supplement in the evenings if not eating a complete meal Make sure to have candy or glucose tablets on you at all times; try drinking coke along with eating snack to treat hypoglycemia Eat at least 3 meals a day with snack in between to help prevent hypoglycemia Please consider/participate with Home Health Nursing/Therapy involvement Fall precautions and preventions; Wear life alert at all times Contact Endocrinology for reinstating insulin pump Weigh self daily using walker to balance Low salt  carb modified diet  Follow Up Plan: The care management team will reach out to the patient again over the next 30 business days.        Plan:The care management team will reach out to the patient again over the next 30 days.   [name] RN, MSN RN Care Management Coordinator [place] [phone] [email or web address]

## 2021-10-06 ENCOUNTER — Encounter (INDEPENDENT_AMBULATORY_CARE_PROVIDER_SITE_OTHER): Payer: Self-pay | Admitting: Nurse Practitioner

## 2021-10-07 DIAGNOSIS — E1065 Type 1 diabetes mellitus with hyperglycemia: Secondary | ICD-10-CM | POA: Diagnosis not present

## 2021-10-07 DIAGNOSIS — I130 Hypertensive heart and chronic kidney disease with heart failure and stage 1 through stage 4 chronic kidney disease, or unspecified chronic kidney disease: Secondary | ICD-10-CM | POA: Diagnosis not present

## 2021-10-07 DIAGNOSIS — Z9181 History of falling: Secondary | ICD-10-CM | POA: Diagnosis not present

## 2021-10-07 DIAGNOSIS — M6281 Muscle weakness (generalized): Secondary | ICD-10-CM | POA: Diagnosis not present

## 2021-10-07 DIAGNOSIS — S2232XD Fracture of one rib, left side, subsequent encounter for fracture with routine healing: Secondary | ICD-10-CM | POA: Diagnosis not present

## 2021-10-07 DIAGNOSIS — E10649 Type 1 diabetes mellitus with hypoglycemia without coma: Secondary | ICD-10-CM | POA: Diagnosis not present

## 2021-10-08 ENCOUNTER — Other Ambulatory Visit: Payer: Self-pay

## 2021-10-08 ENCOUNTER — Encounter (INDEPENDENT_AMBULATORY_CARE_PROVIDER_SITE_OTHER): Payer: Self-pay

## 2021-10-08 ENCOUNTER — Ambulatory Visit (INDEPENDENT_AMBULATORY_CARE_PROVIDER_SITE_OTHER): Payer: Medicare Other | Admitting: Nurse Practitioner

## 2021-10-08 VITALS — BP 137/68 | HR 61 | Resp 16 | Wt 210.0 lb

## 2021-10-08 DIAGNOSIS — I890 Lymphedema, not elsewhere classified: Secondary | ICD-10-CM

## 2021-10-08 NOTE — Progress Notes (Signed)
History of Present Illness  There is no documented history at this time  Assessments & Plan   There are no diagnoses linked to this encounter.    Additional instructions  Subjective:  Patient presents with venous ulcer of the Bilateral lower extremity.    Procedure:  3 layer unna wrap was placed Bilateral lower extremity.   Plan:   Follow up in one week.  

## 2021-10-10 ENCOUNTER — Ambulatory Visit: Payer: Medicare Other | Admitting: *Deleted

## 2021-10-10 DIAGNOSIS — I5022 Chronic systolic (congestive) heart failure: Secondary | ICD-10-CM

## 2021-10-10 DIAGNOSIS — E1151 Type 2 diabetes mellitus with diabetic peripheral angiopathy without gangrene: Secondary | ICD-10-CM

## 2021-10-11 DIAGNOSIS — E1151 Type 2 diabetes mellitus with diabetic peripheral angiopathy without gangrene: Secondary | ICD-10-CM

## 2021-10-11 DIAGNOSIS — I5022 Chronic systolic (congestive) heart failure: Secondary | ICD-10-CM | POA: Diagnosis not present

## 2021-10-12 DIAGNOSIS — Z87891 Personal history of nicotine dependence: Secondary | ICD-10-CM | POA: Insufficient documentation

## 2021-10-12 DIAGNOSIS — Z9181 History of falling: Secondary | ICD-10-CM | POA: Insufficient documentation

## 2021-10-12 NOTE — Chronic Care Management (AMB) (Signed)
Chronic Care Management   CCM RN Visit Note  10/12/2021 Name: Gary [name] MRN: [id number] DOB: 01-21-1946  Subjective: Gary [name] is a 76 y.o. year old male who is a primary care patient of [name], MD. The care management team was consulted for assistance with disease management and care coordination needs.    Engaged with patient by telephone for follow up visit in response to provider referral for case management and/or care coordination services.   Consent to Services:  The patient was given information about Chronic Care Management services, agreed to services, and gave verbal consent prior to initiation of services.  Please see initial visit note for detailed documentation.   Patient agreed to services and verbal consent obtained.   Assessment: Review of patient past medical history, allergies, medications, health status, including review of consultants reports, laboratory and other test data, was performed as part of comprehensive evaluation and provision of chronic care management services.   SDOH (Social Determinants of Health) assessments and interventions performed:    CCM Care Plan  Allergies  Allergen Reactions   Rofecoxib Nausea Only    Outpatient Encounter Medications as of 10/10/2021  Medication Sig   acetaminophen (TYLENOL) 325 MG tablet Take 650 mg by mouth every 6 (six) hours as needed.   bacitracin 500 UNIT/GM ointment Apply 1 application topically daily.   carvedilol (COREG) 3.125 MG tablet Take 1 tablet (3.125 mg total) by mouth 2 (two) times daily with a meal. Hold if systolic <025 and HR <85   Cholecalciferol (VITAMIN D3) 50 MCG (2000 UT) CAPS Take 2,000 Units by mouth daily.   feeding supplement, GLUCERNA SHAKE, (GLUCERNA SHAKE) LIQD Take 237 mLs by mouth 2 (two) times daily between meals.   ferrous sulfate 325 (65 FE) MG tablet Take 325 mg by mouth daily with breakfast.   FLUoxetine (PROZAC) 20 MG capsule Take 1 capsule (20 mg total) by mouth  daily.   furosemide (LASIX) 40 MG tablet Take 1 tablet (40 mg total) by mouth daily.   glucose 4 GM chewable tablet Chew 1 tablet (4 g total) by mouth once as needed for low blood sugar.   insulin aspart (NOVOLOG) 100 UNIT/ML injection Inject 0-9 Units into the skin 3 (three) times daily with meals. CBG 70 - 120: 0 units CBG 121 - 150: 1 unit CBG 151 - 200: 2 units CBG 201 - 250: 3 units CBG 251 - 300: 5 units CBG 301 - 350: 7 units CBG 351 - 400: 9 units   insulin aspart (NOVOLOG) 100 UNIT/ML injection Inject 0-5 Units into the skin at bedtime. CBG 70 - 120: 0 units CBG 121 - 150: 0 units CBG 151 - 200: 0 units CBG 201 - 250: 2 units CBG 251 - 300: 3 units CBG 301 - 350: 4 units CBG 351 - 400: 5 units   insulin aspart (NOVOLOG) 100 UNIT/ML injection Inject 6 Units into the skin 3 (three) times daily with meals.   insulin glargine-yfgn (SEMGLEE) 100 UNIT/ML injection Inject 0.25 mLs (25 Units total) into the skin daily. (Patient taking differently: Inject 16 Units into the skin daily.)   levothyroxine (SYNTHROID) 25 MCG tablet Take 25 mcg by mouth daily before breakfast.   lidocaine (LIDODERM) 5 % Place 1 patch onto the skin daily. Remove & Discard patch within 12 hours or as directed by MD   methocarbamol (ROBAXIN) 750 MG tablet Take 1 tablet (750 mg total) by mouth every 6 (six) hours as needed for muscle  spasms.   polyethylene glycol (MIRALAX / GLYCOLAX) 17 g packet Take 17 g by mouth 2 (two) times daily.   polyvinyl alcohol (LIQUIFILM TEARS) 1.4 % ophthalmic solution Place 1 drop into both eyes as needed for dry eyes.   pravastatin (PRAVACHOL) 40 MG tablet Take 40 mg by mouth daily.    vitamin B-12 (CYANOCOBALAMIN) 1000 MCG tablet Take 1,000 mcg by mouth daily.   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    Left rib fracture_7th rib    Leukocytosis    HTN (hypertension)    Hypothyroid    CAD (coronary artery disease)    Porokeratosis 05/15/2021   Aortic atherosclerosis ([place]) 05/05/2021   Emphysema lung ([place]) 05/05/2021   Thrombocytopenia ([place]) 04/29/2021   Splinter of foot without infection 02/23/2021   Arm laceration 02/09/2021   Hypokalemia 10/24/2020   Injury by nail 08/15/2020   Shingles 07/24/2020   Rash 07/02/2020   Colon cancer screening 07/02/2020   Sick sinus syndrome ([place]) 12/28/2019   Acquired absence of kidney 09/13/2019   Edema of lower extremity 09/13/2019   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, or unspecified 09/13/2019   Proteinuria 09/13/2019   Lower extremity pain, bilateral    Diabetic foot ulcer associated with type 1 diabetes mellitus ([place])    Acute on chronic systolic CHF (congestive heart failure) ([place])    Cellulitis 08/18/2019   Lower limb ulcer, calf ([place]) 08/01/2019   Left hip pain 07/16/2019   Pain due to onychomycosis of toenails of both feet 04/03/2019   Swelling of limb 02/07/2019   Pancytopenia ([place]) 12/03/2018   CKD (chronic kidney disease) stage 3, GFR 30-59 ml/min (HCC) 09/16/2018   Left shoulder pain 09/16/2018   Chronic cholecystitis 12/29/2017   Graves disease 12/29/2017   Peripheral neuropathy 12/29/2017   S/p nephrectomy 12/29/2017   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   Symptomatic bradycardia 12/02/2017   CKD (chronic kidney disease), stage IIIa 16/07/9603   Chronic systolic CHF (congestive heart failure) ([place]) 12/02/2017   Saturday night paralysis 11/12/2017   Hypoglycemia 10/28/2017   Goals of care, counseling/discussion 10/24/2017   Lymphedema 10/20/2017   Iron deficiency anemia 09/11/2017   Fall 08/13/2017   Humerus fracture 08/13/2017   Anemia  01/12/2017   Bilateral carotid artery stenosis 06/29/2016   Hand laceration 12/16/2015   Adjustment disorder with depressed mood 08/11/2015   Cardiomyopathy, idiopathic ([place]) 04/22/2015   CAD in native artery 11/02/2014   Mantle cell lymphoma ([place]) 08/03/2014   History of colonic polyps 04/29/2014   Irregular heart beat 04/22/2014   SOB (shortness of breath) 04/22/2014   Stress 03/21/2014   PVC (premature ventricular contraction) 02/21/2014   GERD (gastroesophageal reflux disease) 10/23/2013   Gastroparesis 06/25/2013   Chest pain 04/13/2013   Thyroid disease 04/13/2013   Diabetes ([place]) 04/13/2013   Essential hypertension, benign 04/13/2013   Hypercholesterolemia 04/13/2013    Conditions to be addressed/monitored:CHF and DMII  Care Plan : Diabetes  Updates made by Leona Singleton, RN since 10/12/2021 12:00 AM     Problem: Hypoglycemia causing syncopal episodes   Priority: Medium     Long-Range Goal: Patient  will report maintaing Hgb A1C of 7 or below in the next 90 days   Start Date: 01/27/2021  Expected End Date: 02/08/2022  Recent Progress: Not on track  Priority: Medium  Note:   Objective:  Lab Results  Component Value Date   HGBA1C 7.0 01/23/2021  Current Barriers:  Knowledge Deficits related to basic Diabetes pathophysiology and self care/management as evidenced by multiple syncopal episodes related to hypoglycemia.  Latest Hgb A1C 6.9 on 08/12/21.  Just discharged from SNF few weeks from  recent hospitalization for fall and hypoglycemia.  States he turned his insulin pump back on when he returned home.  Has not poken with Endocrino;ogy.  Reports blood sugars have ranged 120's with a few hypoglycemic events in th 50's.  Fasting this morning 50.  Instructed to contact Endocrnologist as soon as possible.  Does report the home health agency has made contact (unsure  of the name), he is just waiting for them to come out (1/5 or 1/6).  Denies any falls since discharge  home. Limited Social Support Trouble getting insulin pump supplies-reports he now has insulin pump sensors Case Manager Clinical Goal(s):  patient will demonstrate improved adherence to prescribed treatment plan for diabetes self care/management as evidenced by: at least 4 times a day monitoring and recording of CBG, adherence to ADA/ carb modified diet, adherence to prescribed medication regimen, contacting provider for new or worsened symptoms or questions  Diabetes Interventions:  (Status: Goal on track: NO.)  Long Term Goal  Collaboration with [name], MD regarding development and update of comprehensive plan of care as evidenced by provider attestation and co-signature Inter-disciplinary care team collaboration (see longitudinal plan of care) Provided education to patient about basic DM disease process Discussed medications and encouraged medication compliance Discussed plans with patient for ongoing care management follow up and provided patient with direct contact information for care management team Encouraged continued participation with CCM Pharmacist  Reviewed glucose ranges and hypoglycemic events; and discussed dangers of hypoglycemia and treatment options, need for immediate sugar replacement with juice, sugar, soda in addition to snack or candy bar or glucose tablets Discussed proper treatment of hypoglycemia Discussed healthy meal and snack options Rediscussed possible Home Health Nursing or Therapy referral for safety evaluation after 2 falls, educating and discussing even if they start services, patient can cancel at any time (states he has spoken with them and awaiting them to come out) Discussed and encouraged patient to be proactive with health instead of reactive after falls Fall precautions and preventions reviewed and discussed, encouraged to use walker with all ambulation,  Instructed and encouraged to wear life alert at all times Discussed logging blood sugars  and writing down insulin doses taken to help provider manage medications; sent 2022 Calendar Booklet to help with keeping log of blood sugars and insulin dose Reviewed and discussed proper diabetic foot care, encouraged patient to keep check on right foot splinter wound for signs and symptoms of infection Discussed and encouraged and reinforced patient to wear bedroom/house/slides while in the home and not to walk around bare foot Discussed with patient asking podiatrist for prescription for diabetic shoes Instructed & Encouraged to contact Endocrinology ASAP since turning pump back on for possible insulin adjustments;  offered assistants to contact endocrinology (declines at this time)  Heart Failure Interventions:  (Status: Goal on track: NO.)  Long Term Goal  Basic overview and discussion of pathophysiology of Heart Failure reviewed Provided education on low sodium diet Reviewed Heart Failure Action Plan  in depth and provided written copy Discussed importance of daily weight and advised patient to weigh and record daily Reviewed role of diuretics in prevention of fluid overload and management of heart failure Discussed the importance of keeping all appointments with provider Provided patient with education about the role of exercise in the management of heart failure  Use walker to help stand on scale Fall precautions and preventions Patient Goals/Self-Care Activities Check blood sugar at least 5 times a day Check blood sugar if I feel it is too high or too low Take the blood sugar meter and log to all doctor visits  Consider drinking Boost or other supplement in the evenings if not eating a complete meal Make sure to have candy or glucose tablets on you at all times; try drinking coke along with eating snack to treat hypoglycemia Eat at least 3 meals a day with snack in between to help prevent hypoglycemia Please consider/participate with Home Health Nursing/Therapy involvement Fall  precautions and preventions; Wear life alert at all times Contact Endocrinology for reinstating insulin pump Weigh self daily using walker to balance Low salt carb modified diet  Follow Up Plan: The care management team will reach out to the patient again over the next 21 business days.        Plan:The care management team will reach out to the patient again over the next 21 days.  [name] RN, MSN RN Care Management Coordinator [place] [phone] [email or web address]

## 2021-10-12 NOTE — Patient Instructions (Addendum)
Visit Information  Thank you for taking time to visit with me today. Please don't hesitate to contact me if I can be of assistance to you before our next scheduled telephone appointment.  Following are the goals we discussed today:  Check blood sugar at least 5 times a day Check blood sugar if I feel it is too high or too low Take the blood sugar meter and log to all doctor visits  Consider drinking Boost or other supplement in the evenings if not eating a complete meal Make sure to have candy or glucose tablets on you at all times; try drinking coke along with eating snack to treat hypoglycemia Eat at least 3 meals a day with snack in between to help prevent hypoglycemia Please consider/participate with Home Health Nursing/Therapy involvement Fall precautions and preventions; Wear life alert at all times Contact Endocrinology for reinstating insulin pump Weigh self daily using walker to balance Low salt carb modified diet  Our next appointment is by telephone on 1/11 at 1400  Please call the care guide team at [phone] if you need to cancel or reschedule your appointment.   If you are experiencing a Mental Health or [place] or need someone to talk to, please call the Suicide and Crisis Lifeline: 988 call the [place] National Suicide Prevention Lifeline: 212-702-0316 or TTY: 6842187562 TTY (579)587-4572) to talk to a trained counselor call 1-800-273-TALK (toll free, 24 hour hotline) go to [facility] [address], [place] [phone]) call 911   Patient verbalizes understanding of instructions provided today and agrees to view in [place].   [name] RN, MSN RN Care Management Coordinator [place] [phone] [email or web address]

## 2021-10-13 ENCOUNTER — Encounter (INDEPENDENT_AMBULATORY_CARE_PROVIDER_SITE_OTHER): Payer: Self-pay | Admitting: Nurse Practitioner

## 2021-10-14 ENCOUNTER — Telehealth: Payer: Self-pay | Admitting: Internal Medicine

## 2021-10-14 DIAGNOSIS — M6281 Muscle weakness (generalized): Secondary | ICD-10-CM | POA: Diagnosis not present

## 2021-10-14 DIAGNOSIS — I130 Hypertensive heart and chronic kidney disease with heart failure and stage 1 through stage 4 chronic kidney disease, or unspecified chronic kidney disease: Secondary | ICD-10-CM | POA: Diagnosis not present

## 2021-10-14 DIAGNOSIS — E1065 Type 1 diabetes mellitus with hyperglycemia: Secondary | ICD-10-CM | POA: Diagnosis not present

## 2021-10-14 DIAGNOSIS — S2232XD Fracture of one rib, left side, subsequent encounter for fracture with routine healing: Secondary | ICD-10-CM | POA: Diagnosis not present

## 2021-10-14 DIAGNOSIS — E10649 Type 1 diabetes mellitus with hypoglycemia without coma: Secondary | ICD-10-CM | POA: Diagnosis not present

## 2021-10-14 DIAGNOSIS — Z9181 History of falling: Secondary | ICD-10-CM | POA: Diagnosis not present

## 2021-10-14 NOTE — Telephone Encounter (Signed)
Received notification from his CCM follow up that he was home from rehab.  Noticed blood sugar - am 50.  He was supposed to f/u with his endocrinologist.  Please call and confirm he has contacted endocrinology - needs f/u with Dr Honor Junes [facility] Endocr[facility].

## 2021-10-14 NOTE — Telephone Encounter (Signed)
Spoke with patient confirmed doing ok. He had not reached out to endocrinology. He has upcoming appt with Dr Gabriel Carina in April. Called endocrinology and scheduled pt with Dr Honor Junes 10/16/21 at 9:30. Pt is aware of appt date and time.

## 2021-10-14 NOTE — Telephone Encounter (Signed)
Pt called stating he does not have transportation for upcoming appt on Thursday at [facility].

## 2021-10-14 NOTE — Telephone Encounter (Signed)
LMTCB

## 2021-10-15 ENCOUNTER — Telehealth: Payer: Self-pay | Admitting: Internal Medicine

## 2021-10-15 ENCOUNTER — Encounter (INDEPENDENT_AMBULATORY_CARE_PROVIDER_SITE_OTHER): Payer: Self-pay | Admitting: Nurse Practitioner

## 2021-10-15 ENCOUNTER — Ambulatory Visit (INDEPENDENT_AMBULATORY_CARE_PROVIDER_SITE_OTHER): Payer: Medicare Other | Admitting: Nurse Practitioner

## 2021-10-15 VITALS — BP 118/69 | HR 80 | Resp 16 | Wt 213.8 lb

## 2021-10-15 DIAGNOSIS — S2232XD Fracture of one rib, left side, subsequent encounter for fracture with routine healing: Secondary | ICD-10-CM | POA: Diagnosis not present

## 2021-10-15 DIAGNOSIS — I130 Hypertensive heart and chronic kidney disease with heart failure and stage 1 through stage 4 chronic kidney disease, or unspecified chronic kidney disease: Secondary | ICD-10-CM | POA: Diagnosis not present

## 2021-10-15 DIAGNOSIS — E1065 Type 1 diabetes mellitus with hyperglycemia: Secondary | ICD-10-CM | POA: Diagnosis not present

## 2021-10-15 DIAGNOSIS — I890 Lymphedema, not elsewhere classified: Secondary | ICD-10-CM

## 2021-10-15 DIAGNOSIS — M6281 Muscle weakness (generalized): Secondary | ICD-10-CM | POA: Diagnosis not present

## 2021-10-15 DIAGNOSIS — E10649 Type 1 diabetes mellitus with hypoglycemia without coma: Secondary | ICD-10-CM | POA: Diagnosis not present

## 2021-10-15 DIAGNOSIS — Z9181 History of falling: Secondary | ICD-10-CM | POA: Diagnosis not present

## 2021-10-15 NOTE — Telephone Encounter (Signed)
Verbals given  

## 2021-10-15 NOTE — Telephone Encounter (Signed)
[facility] called in stated Patient needs an verbal order Physical Therapy once a week for 4 weeks and every other week for 4 weeks . Please call or leave message at [phone]

## 2021-10-15 NOTE — Progress Notes (Signed)
History of Present Illness  There is no documented history at this time  Assessments & Plan   There are no diagnoses linked to this encounter.    Additional instructions  Subjective:  Patient presents with venous ulcer of the Bilateral lower extremity.    Procedure:  3 layer unna wrap was placed Bilateral lower extremity.   Plan:   Follow up in one week.  

## 2021-10-17 ENCOUNTER — Other Ambulatory Visit: Payer: Self-pay

## 2021-10-17 ENCOUNTER — Ambulatory Visit (INDEPENDENT_AMBULATORY_CARE_PROVIDER_SITE_OTHER): Payer: Medicare Other | Admitting: Internal Medicine

## 2021-10-17 VITALS — BP 128/72 | HR 89 | Temp 97.9°F | Resp 16 | Ht 71.0 in | Wt 215.4 lb

## 2021-10-17 DIAGNOSIS — E1151 Type 2 diabetes mellitus with diabetic peripheral angiopathy without gangrene: Secondary | ICD-10-CM | POA: Diagnosis not present

## 2021-10-17 DIAGNOSIS — S2232XA Fracture of one rib, left side, initial encounter for closed fracture: Secondary | ICD-10-CM

## 2021-10-17 DIAGNOSIS — I700 Atherosclerosis of aorta: Secondary | ICD-10-CM

## 2021-10-17 DIAGNOSIS — D509 Iron deficiency anemia, unspecified: Secondary | ICD-10-CM

## 2021-10-17 DIAGNOSIS — I5022 Chronic systolic (congestive) heart failure: Secondary | ICD-10-CM

## 2021-10-17 DIAGNOSIS — C8318 Mantle cell lymphoma, lymph nodes of multiple sites: Secondary | ICD-10-CM | POA: Diagnosis not present

## 2021-10-17 DIAGNOSIS — E10621 Type 1 diabetes mellitus with foot ulcer: Secondary | ICD-10-CM

## 2021-10-17 DIAGNOSIS — E079 Disorder of thyroid, unspecified: Secondary | ICD-10-CM | POA: Diagnosis not present

## 2021-10-17 DIAGNOSIS — I10 Essential (primary) hypertension: Secondary | ICD-10-CM

## 2021-10-17 DIAGNOSIS — I495 Sick sinus syndrome: Secondary | ICD-10-CM | POA: Diagnosis not present

## 2021-10-17 DIAGNOSIS — J439 Emphysema, unspecified: Secondary | ICD-10-CM | POA: Diagnosis not present

## 2021-10-17 DIAGNOSIS — N1832 Chronic kidney disease, stage 3b: Secondary | ICD-10-CM | POA: Diagnosis not present

## 2021-10-17 DIAGNOSIS — L97509 Non-pressure chronic ulcer of other part of unspecified foot with unspecified severity: Secondary | ICD-10-CM

## 2021-10-17 DIAGNOSIS — I429 Cardiomyopathy, unspecified: Secondary | ICD-10-CM

## 2021-10-17 DIAGNOSIS — I890 Lymphedema, not elsewhere classified: Secondary | ICD-10-CM

## 2021-10-17 DIAGNOSIS — E7800 Pure hypercholesterolemia, unspecified: Secondary | ICD-10-CM

## 2021-10-17 NOTE — Addendum Note (Signed)
Addended by: Neta Ehlers on: 10/17/2021 04:16 PM   Modules accepted: Orders

## 2021-10-17 NOTE — Addendum Note (Signed)
Addended by: Neta Ehlers on: 10/17/2021 04:15 PM   Modules accepted: Orders

## 2021-10-17 NOTE — Progress Notes (Signed)
[name], male   DOB: 09/25/1946, 75 y.o.   MRN: [id number] ° ° °Subjective:  ° ° [name], male    DOB: 08/25/1946, 75 y.o.   MRN: [id number] ° °This visit occurred during the SARS-CoV-2 public health emergency.  Safety protocols were in place, including screening questions prior to the visit, additional usage of staff PPE, and extensive cleaning of exam room while observing appropriate contact time as indicated for disinfecting solutions.  ° °Patient here for a scheduled follow up.  ° °Chief Complaint  °Patient presents with  ° Diabetes  ° Hyperlipidemia  ° Hypothyroidism  ° .  ° °HPI °Was admitted 08/29/21 - 09/13/21 with severe hypoglycemia/fall, etc.  Was found to have blood sugar 19 on presentation.  Insulin pump discontinued.  Found to have minimally displaced fracture of left seventh rib.  Evaluated by PT/OT.  Discharged to rehab.  Discharged to home approximately one week ago.  He stopped his coreg on his own.  States blood pressure is doing well.  No chest pain.  Breathing stable.  No increased cough or congestion.  Waling with a cane inside.  Uses a walker outside.  Is home now.  PT starting to work with him.  Getting meals on wheels.  Also planning to have nursing evaluation.   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   ° Gastroparesis   ° Graves disease   ° Hypercholesterolemia   ° Hypertension   ° Mantle cell lymphoma (HCC) 07/2014  ° Peripheral neuropathy   ° Shingles   ° °Past Surgical History:  °Procedure Laterality Date  ° CHOLECYSTECTOMY    ° NEPHRECTOMY    ° right  ° PACEMAKER INSERTION N/A 12/07/2017  ° Procedure: INSERTION PACEMAKER DUAL CHAMBER INITIAL INSERT;  Surgeon: Paraschos, Alexander, MD;  Location: [facility] ORS;  Service: Cardiovascular;  Laterality: N/A;  ° PORTA CATH INSERTION N/A 11/03/2017  ° Procedure: PORTA  CATH INSERTION;  Surgeon: Schnier, Gregory G, MD;  Location: [facility];  Service: Cardiovascular;  Laterality: N/A;  ° °Family History  °Problem Relation Age of Onset  ° Breast cancer Mother   ° Diabetes Father   ° Cancer Father   °     Lymphoma  ° Alcohol abuse Maternal Uncle   ° Diabetes Sister   ° Heart disease Other   °     grandfather  ° °Social History  ° °Socioeconomic History  ° Marital status: Widowed  °  Spouse name: Not on file  ° Number of children: 1  ° Years of education: 12  ° Highest education level: 12th grade  °Occupational History  ° Occupation: Retired  °Tobacco Use  ° Smoking status: Former  ° Smokeless tobacco: Current  °  Types: Chew  °Vaping Use  ° Vaping Use: Never used  °Substance and Sexual Activity  ° Alcohol use: Yes  °  Alcohol/week: 3.0 standard drinks  °  Types: 3 Cans of beer per week  °  Comment: nightly  ° Drug use: Never  ° Sexual activity: Not Currently  °Other Topics Concern  ° Not on file  °Social History Narrative  ° Not on file  ° °Social Determinants of Health  ° °Financial Resource Strain: Low Risk   ° Difficulty of Paying Living Expenses: Not hard at all  °  Food Insecurity: No Food Insecurity   Worried About Charity fundraiser in the Last Year: Never true   Ran Out of Food in the Last Year: Never true  Transportation Needs: No Transportation Needs   Lack of Transportation (Medical): No   Lack of Transportation (Non-Medical): No  Physical Activity: Inactive   Days of Exercise per Week: 0 days   Minutes of Exercise per Session: 0 min  Stress: Stress Concern Present   Feeling of Stress : To some extent  Social Connections: Socially Isolated   Frequency of Communication with Friends and Family: More than three times a week   Frequency of Social Gatherings with Friends and Family: Twice a week   Attends Religious Services: Never   Marine scientist or Organizations: No   Attends Archivist Meetings: Never   Marital Status: Widowed      Review of Systems  Constitutional:  Negative for appetite change and unexpected weight change.  HENT:  Negative for congestion.   Respiratory:  Negative for cough, chest tightness and shortness of breath.   Cardiovascular:  Negative for chest pain, palpitations and leg swelling.  Gastrointestinal:  Negative for nausea and vomiting.  Genitourinary:  Negative for difficulty urinating and dysuria.  Musculoskeletal:  Negative for joint swelling and myalgias.  Skin:  Negative for color change and rash.  Neurological:  Negative for dizziness, light-headedness and headaches.  Psychiatric/Behavioral:  Negative for agitation and dysphoric mood.       Objective:     BP 128/72    Pulse 89    Temp 97.9 F (36.6 C)    Resp 16    Ht 5' 11" (1.803 m)    Wt 215 lb 6.4 oz (97.7 kg)    SpO2 98%    BMI 30.04 kg/m  Wt Readings from Last 3 Encounters:  10/17/21 215 lb 6.4 oz (97.7 kg)  10/15/21 213 lb 12.8 oz (97 kg)  10/08/21 210 lb (95.3 kg)    Physical Exam Vitals reviewed.  Constitutional:      General: He is not in acute distress.    Appearance: Normal appearance. He is well-developed.  HENT:     Head: Normocephalic and atraumatic.     Right Ear: External ear normal.     Left Ear: External ear normal.  Eyes:     General: No scleral icterus.       Right eye: No discharge.        Left eye: No discharge.     Conjunctiva/sclera: Conjunctivae normal.  Cardiovascular:     Rate and Rhythm: Normal rate and regular rhythm.  Pulmonary:     Effort: Pulmonary effort is normal. No respiratory distress.     Breath sounds: Normal breath sounds.  Abdominal:     General: Bowel sounds are normal.     Palpations: Abdomen is soft.     Tenderness: There is no abdominal tenderness.  Musculoskeletal:        General: No swelling or tenderness.     Cervical back: Neck supple. No tenderness.  Lymphadenopathy:     Cervical: No cervical adenopathy.  Skin:    Findings: No erythema or rash.   Neurological:     Mental Status: He is alert.  Psychiatric:        Mood and Affect: Mood normal.        Behavior: Behavior normal.     Outpatient Encounter Medications as of 10/17/2021  Medication Sig   acetaminophen (TYLENOL) 325  MG tablet Take 650 mg by mouth every 6 (six) hours as needed.  ° bacitracin 500 UNIT/GM ointment Apply 1 application topically daily.  ° carvedilol (COREG) 3.125 MG tablet Take 1 tablet (3.125 mg total) by mouth 2 (two) times daily with a meal. Hold if systolic <110 and HR <60 (Patient not taking: Reported on 10/17/2021)  ° Cholecalciferol (VITAMIN D3) 50 MCG (2000 UT) CAPS Take 2,000 Units by mouth daily.  ° feeding supplement, GLUCERNA SHAKE, (GLUCERNA SHAKE) LIQD Take 237 mLs by mouth 2 (two) times daily between meals.  ° ferrous sulfate 325 (65 FE) MG tablet Take 325 mg by mouth daily with breakfast.  ° FLUoxetine (PROZAC) 20 MG capsule Take 1 capsule (20 mg total) by mouth daily.  ° furosemide (LASIX) 40 MG tablet Take 1 tablet (40 mg total) by mouth daily.  ° glucose 4 GM chewable tablet Chew 1 tablet (4 g total) by mouth once as needed for low blood sugar.  ° insulin aspart (NOVOLOG) 100 UNIT/ML injection Inject 0-9 Units into the skin 3 (three) times daily with meals. CBG 70 - 120: 0 units CBG 121 - 150: 1 unit CBG 151 - 200: 2 units CBG 201 - 250: 3 units CBG 251 - 300: 5 units CBG 301 - 350: 7 units CBG 351 - 400: 9 units  ° insulin aspart (NOVOLOG) 100 UNIT/ML injection Inject 0-5 Units into the skin at bedtime. CBG 70 - 120: 0 units CBG 121 - 150: 0 units CBG 151 - 200: 0 units CBG 201 - 250: 2 units CBG 251 - 300: 3 units CBG 301 - 350: 4 units CBG 351 - 400: 5 units  ° insulin aspart (NOVOLOG) 100 UNIT/ML injection Inject 6 Units into the skin 3 (three) times daily with meals.  ° insulin glargine-yfgn (SEMGLEE) 100 UNIT/ML injection Inject 0.25 mLs (25 Units total) into the skin daily. (Patient taking differently: Inject 16 Units into the skin daily.)  ° levothyroxine  (SYNTHROID) 25 MCG tablet Take 25 mcg by mouth daily before breakfast.  ° lidocaine (LIDODERM) 5 % Place 1 patch onto the skin daily. Remove & Discard patch within 12 hours or as directed by MD  ° methocarbamol (ROBAXIN) 750 MG tablet Take 1 tablet (750 mg total) by mouth every 6 (six) hours as needed for muscle spasms.  ° polyethylene glycol (MIRALAX / GLYCOLAX) 17 g packet Take 17 g by mouth 2 (two) times daily.  ° polyvinyl alcohol (LIQUIFILM TEARS) 1.4 % ophthalmic solution Place 1 drop into both eyes as needed for dry eyes.  ° pravastatin (PRAVACHOL) 40 MG tablet Take 40 mg by mouth daily.   ° vitamin B-12 (CYANOCOBALAMIN) 1000 MCG tablet Take 1,000 mcg by mouth daily.  ° °Facility-Administered Encounter Medications as of 10/17/2021  °Medication  ° heparin lock flush 100 unit/mL  ° sodium chloride flush (NS) 0.9 % injection 10 mL  ° sodium chloride flush (NS) 0.9 % injection 10 mL  °  ° °Lab Results  °Component Value Date  ° WBC 5.2 10/17/2021  ° HGB 13.1 (L) 10/17/2021  ° HCT 39.5 10/17/2021  ° PLT 149 10/17/2021  ° GLUCOSE 191 (H) 10/17/2021  ° CHOL 126 04/29/2021  ° TRIG 50.0 04/29/2021  ° HDL 44.20 04/29/2021  ° LDLCALC 72 04/29/2021  ° ALT 7 08/29/2021  ° AST 17 08/29/2021  ° NA 141 10/17/2021  ° K 3.4 (L) 10/17/2021  ° CL 103 10/17/2021  ° CREATININE 1.38 (H) 10/17/2021  ° BUN 19 10/17/2021  °   CO2 25 10/17/2021   TSH 2.066 08/29/2021   INR 1.2 08/29/2021   HGBA1C 7.0 01/23/2021   MICROALBUR 1.9 09/22/2016    CT HEAD WO CONTRAST (5MM)  Result Date: 08/29/2021 CLINICAL DATA:  Altered mental status, Unwitnessed fall, left rib pain EXAM: CT HEAD WITHOUT CONTRAST CT MAXILLOFACIAL WITHOUT CONTRAST CT CERVICAL SPINE WITHOUT CONTRAST CT CHEST, ABDOMEN AND PELVIS WITH CONTRAST TECHNIQUE: Contiguous axial images were obtained from the base of the skull through the vertex without intravenous contrast. Multidetector CT imaging of the maxillofacial structures was performed. Multiplanar CT image reconstructions  were also generated. A small metallic BB was placed on the right temple in order to reliably differentiate right from left. Multidetector CT imaging of the cervical spine was performed without intravenous contrast. Multiplanar CT image reconstructions were also generated. Multidetector CT imaging of the chest, abdomen and pelvis was performed following the standard protocol during bolus administration of intravenous contrast. CONTRAST:  136m OMNIPAQUE IOHEXOL 300 MG/ML  SOLN COMPARISON:  08/25/2021, 07/29/2020 FINDINGS: CT HEAD FINDINGS Brain: Normal anatomic configuration. Parenchymal volume loss is commensurate with the patient's age. Mild periventricular white matter changes are present likely reflecting the sequela of small vessel ischemia. No abnormal intra or extra-axial mass lesion or fluid collection. No abnormal mass effect or midline shift. No evidence of acute intracranial hemorrhage or infarct. Ventricular size is normal. Cerebellum unremarkable. Vascular: No asymmetric hyperdense vasculature at the skull base. Skull: Intact Other: Mastoid air cells and middle ear cavities are clear. Small left frontal scalp hematoma noted. CT MAXILLOFACIAL FINDINGS Osseous: No fracture or mandibular dislocation. No destructive process. Orbits: Ocular lenses have been removed. The orbits are otherwise unremarkable. Sinuses: The paranasal sinuses are clear. Soft tissues: Mild left preseptal and infraorbital soft tissue swelling. CT CERVICAL SPINE FINDINGS Alignment: 2 mm anterolisthesis of C3 upon C4 is unchanged. Skull base and vertebrae: Craniocervical alignment is normal. Atlantodental interval is not widened. No acute fracture the cervical spine. Vertebral body height has been preserved. Soft tissues and spinal canal: No prevertebral fluid or swelling. No visible canal hematoma. Disc levels: Intervertebral disc space narrowing and endplate remodeling at CJ1-9and C6-7 is in keeping with changes of moderate  degenerative disc disease. Minimal degenerative changes are seen throughout the remainder of the cervical spine. The prevertebral soft tissues are not thickened on sagittal reformats. Mild central canal stenosis at C3-4 secondary to anterolisthesis and associated posterior disc osteophyte complex. Review of the axial images demonstrates multilevel uncovertebral and facet arthrosis resulting in moderate left and mild right neuroforaminal narrowing at C3-4 and C4-5. Other: None CT CHEST FINDINGS Cardiovascular: Moderate coronary artery calcification. Global cardiac size within normal limits. No pericardial effusion. Central pulmonary arteries are of normal caliber. The thoracic aorta is unremarkable. Left subclavian dual lead pacemaker is in place with leads within the right atrium and right ventricle. Left internal jugular chest port tip is seen within the superior vena cava. Mediastinum/Nodes: No enlarged mediastinal, hilar, or axillary lymph nodes. Thyroid gland, trachea, and esophagus demonstrate no significant findings. Lungs/Pleura: Mild basilar predominant pulmonary fibrotic change. No focal pulmonary infiltrate. No pneumothorax or pleural effusion. No central obstructing mass. Musculoskeletal: An acute minimally displaced fracture of the left seventh rib is seen laterally. Multiple healed bilateral rib fractures are noted. CT ABDOMEN AND PELVIS FINDINGS Hepatobiliary: No focal liver abnormality is seen. Status post cholecystectomy. No biliary dilatation. Pancreas: Unremarkable Spleen: Unremarkable Adrenals/Urinary Tract: Adrenal glands are unremarkable. Status post right nephrectomy. Left kidney is normal in size and position.  Stable probable hyperdense renal cyst arising from the lower pole of the left kidney. Left kidney is otherwise unremarkable. Bladder unremarkable. Stomach/Bowel: The stomach, small bowel, and large bowel are unremarkable. No free intraperitoneal gas or fluid. Vascular/Lymphatic: Aortic  atherosclerosis. No enlarged abdominal or pelvic lymph nodes. Reproductive: Mild prostatic enlargement. Other: Tiny left fat containing inguinal hernia Musculoskeletal: No acute bone abnormality within the abdomen and pelvis. Osseous structures are age-appropriate. IMPRESSION: No acute intracranial injury. No calvarial fracture. Small left frontal scalp hematoma. No acute facial fracture. Mild left preseptal and infraorbital soft tissue swelling. No acute fracture or listhesis of the cervical spine. Acute left seventh rib fracture.  No pneumothorax. No acute intra-abdominal pathology. Additional incidental findings as noted above. Aortic Atherosclerosis (ICD10-I70.0).  Fall Electronically Signed   By: Ashesh  Parikh M.D.   On: 08/29/2021 02:56  ° °CT Cervical Spine Wo Contrast ° °Result Date: 08/29/2021 °CLINICAL DATA:  Altered mental status, Unwitnessed fall, left rib pain EXAM: CT HEAD WITHOUT CONTRAST CT MAXILLOFACIAL WITHOUT CONTRAST CT CERVICAL SPINE WITHOUT CONTRAST CT CHEST, ABDOMEN AND PELVIS WITH CONTRAST TECHNIQUE: Contiguous axial images were obtained from the base of the skull through the vertex without intravenous contrast. Multidetector CT imaging of the maxillofacial structures was performed. Multiplanar CT image reconstructions were also generated. A small metallic BB was placed on the right temple in order to reliably differentiate right from left. Multidetector CT imaging of the cervical spine was performed without intravenous contrast. Multiplanar CT image reconstructions were also generated. Multidetector CT imaging of the chest, abdomen and pelvis was performed following the standard protocol during bolus administration of intravenous contrast. CONTRAST:  100mL OMNIPAQUE IOHEXOL 300 MG/ML  SOLN COMPARISON:  08/25/2021, 07/29/2020 FINDINGS: CT HEAD FINDINGS Brain: Normal anatomic configuration. Parenchymal volume loss is commensurate with the patient's age. Mild periventricular white matter  changes are present likely reflecting the sequela of small vessel ischemia. No abnormal intra or extra-axial mass lesion or fluid collection. No abnormal mass effect or midline shift. No evidence of acute intracranial hemorrhage or infarct. Ventricular size is normal. Cerebellum unremarkable. Vascular: No asymmetric hyperdense vasculature at the skull base. Skull: Intact Other: Mastoid air cells and middle ear cavities are clear. Small left frontal scalp hematoma noted. CT MAXILLOFACIAL FINDINGS Osseous: No fracture or mandibular dislocation. No destructive process. Orbits: Ocular lenses have been removed. The orbits are otherwise unremarkable. Sinuses: The paranasal sinuses are clear. Soft tissues: Mild left preseptal and infraorbital soft tissue swelling. CT CERVICAL SPINE FINDINGS Alignment: 2 mm anterolisthesis of C3 upon C4 is unchanged. Skull base and vertebrae: Craniocervical alignment is normal. Atlantodental interval is not widened. No acute fracture the cervical spine. Vertebral body height has been preserved. Soft tissues and spinal canal: No prevertebral fluid or swelling. No visible canal hematoma. Disc levels: Intervertebral disc space narrowing and endplate remodeling at C3-4 and C6-7 is in keeping with changes of moderate degenerative disc disease. Minimal degenerative changes are seen throughout the remainder of the cervical spine. The prevertebral soft tissues are not thickened on sagittal reformats. Mild central canal stenosis at C3-4 secondary to anterolisthesis and associated posterior disc osteophyte complex. Review of the axial images demonstrates multilevel uncovertebral and facet arthrosis resulting in moderate left and mild right neuroforaminal narrowing at C3-4 and C4-5. Other: None CT CHEST FINDINGS Cardiovascular: Moderate coronary artery calcification. Global cardiac size within normal limits. No pericardial effusion. Central pulmonary arteries are of normal caliber. The thoracic aorta  is unremarkable. Left subclavian dual lead pacemaker is in place with   leads within the right atrium and right ventricle. Left internal jugular chest port tip is seen within the superior vena cava. Mediastinum/Nodes: No enlarged mediastinal, hilar, or axillary lymph nodes. Thyroid gland, trachea, and esophagus demonstrate no significant findings. Lungs/Pleura: Mild basilar predominant pulmonary fibrotic change. No focal pulmonary infiltrate. No pneumothorax or pleural effusion. No central obstructing mass. Musculoskeletal: An acute minimally displaced fracture of the left seventh rib is seen laterally. Multiple healed bilateral rib fractures are noted. CT ABDOMEN AND PELVIS FINDINGS Hepatobiliary: No focal liver abnormality is seen. Status post cholecystectomy. No biliary dilatation. Pancreas: Unremarkable Spleen: Unremarkable Adrenals/Urinary Tract: Adrenal glands are unremarkable. Status post right nephrectomy. Left kidney is normal in size and position. Stable probable hyperdense renal cyst arising from the lower pole of the left kidney. Left kidney is otherwise unremarkable. Bladder unremarkable. Stomach/Bowel: The stomach, small bowel, and large bowel are unremarkable. No free intraperitoneal gas or fluid. Vascular/Lymphatic: Aortic atherosclerosis. No enlarged abdominal or pelvic lymph nodes. Reproductive: Mild prostatic enlargement. Other: Tiny left fat containing inguinal hernia Musculoskeletal: No acute bone abnormality within the abdomen and pelvis. Osseous structures are age-appropriate. IMPRESSION: No acute intracranial injury. No calvarial fracture. Small left frontal scalp hematoma. No acute facial fracture. Mild left preseptal and infraorbital soft tissue swelling. No acute fracture or listhesis of the cervical spine. Acute left seventh rib fracture.  No pneumothorax. No acute intra-abdominal pathology. Additional incidental findings as noted above. Aortic Atherosclerosis (ICD10-I70.0).  Fall  Electronically Signed   By: Fidela Salisbury M.D.   On: 08/29/2021 02:56   CT CHEST ABDOMEN PELVIS W CONTRAST  Result Date: 08/29/2021 CLINICAL DATA:  Altered mental status, Unwitnessed fall, left rib pain EXAM: CT HEAD WITHOUT CONTRAST CT MAXILLOFACIAL WITHOUT CONTRAST CT CERVICAL SPINE WITHOUT CONTRAST CT CHEST, ABDOMEN AND PELVIS WITH CONTRAST TECHNIQUE: Contiguous axial images were obtained from the base of the skull through the vertex without intravenous contrast. Multidetector CT imaging of the maxillofacial structures was performed. Multiplanar CT image reconstructions were also generated. A small metallic BB was placed on the right temple in order to reliably differentiate right from left. Multidetector CT imaging of the cervical spine was performed without intravenous contrast. Multiplanar CT image reconstructions were also generated. Multidetector CT imaging of the chest, abdomen and pelvis was performed following the standard protocol during bolus administration of intravenous contrast. CONTRAST:  1101m OMNIPAQUE IOHEXOL 300 MG/ML  SOLN COMPARISON:  08/25/2021, 07/29/2020 FINDINGS: CT HEAD FINDINGS Brain: Normal anatomic configuration. Parenchymal volume loss is commensurate with the patient's age. Mild periventricular white matter changes are present likely reflecting the sequela of small vessel ischemia. No abnormal intra or extra-axial mass lesion or fluid collection. No abnormal mass effect or midline shift. No evidence of acute intracranial hemorrhage or infarct. Ventricular size is normal. Cerebellum unremarkable. Vascular: No asymmetric hyperdense vasculature at the skull base. Skull: Intact Other: Mastoid air cells and middle ear cavities are clear. Small left frontal scalp hematoma noted. CT MAXILLOFACIAL FINDINGS Osseous: No fracture or mandibular dislocation. No destructive process. Orbits: Ocular lenses have been removed. The orbits are otherwise unremarkable. Sinuses: The paranasal sinuses  are clear. Soft tissues: Mild left preseptal and infraorbital soft tissue swelling. CT CERVICAL SPINE FINDINGS Alignment: 2 mm anterolisthesis of C3 upon C4 is unchanged. Skull base and vertebrae: Craniocervical alignment is normal. Atlantodental interval is not widened. No acute fracture the cervical spine. Vertebral body height has been preserved. Soft tissues and spinal canal: No prevertebral fluid or swelling. No visible canal hematoma. Disc levels: Intervertebral disc  space narrowing and endplate remodeling at Y7-0 and C6-7 is in keeping with changes of moderate degenerative disc disease. Minimal degenerative changes are seen throughout the remainder of the cervical spine. The prevertebral soft tissues are not thickened on sagittal reformats. Mild central canal stenosis at C3-4 secondary to anterolisthesis and associated posterior disc osteophyte complex. Review of the axial images demonstrates multilevel uncovertebral and facet arthrosis resulting in moderate left and mild right neuroforaminal narrowing at C3-4 and C4-5. Other: None CT CHEST FINDINGS Cardiovascular: Moderate coronary artery calcification. Global cardiac size within normal limits. No pericardial effusion. Central pulmonary arteries are of normal caliber. The thoracic aorta is unremarkable. Left subclavian dual lead pacemaker is in place with leads within the right atrium and right ventricle. Left internal jugular chest port tip is seen within the superior vena cava. Mediastinum/Nodes: No enlarged mediastinal, hilar, or axillary lymph nodes. Thyroid gland, trachea, and esophagus demonstrate no significant findings. Lungs/Pleura: Mild basilar predominant pulmonary fibrotic change. No focal pulmonary infiltrate. No pneumothorax or pleural effusion. No central obstructing mass. Musculoskeletal: An acute minimally displaced fracture of the left seventh rib is seen laterally. Multiple healed bilateral rib fractures are noted. CT ABDOMEN AND PELVIS  FINDINGS Hepatobiliary: No focal liver abnormality is seen. Status post cholecystectomy. No biliary dilatation. Pancreas: Unremarkable Spleen: Unremarkable Adrenals/Urinary Tract: Adrenal glands are unremarkable. Status post right nephrectomy. Left kidney is normal in size and position. Stable probable hyperdense renal cyst arising from the lower pole of the left kidney. Left kidney is otherwise unremarkable. Bladder unremarkable. Stomach/Bowel: The stomach, small bowel, and large bowel are unremarkable. No free intraperitoneal gas or fluid. Vascular/Lymphatic: Aortic atherosclerosis. No enlarged abdominal or pelvic lymph nodes. Reproductive: Mild prostatic enlargement. Other: Tiny left fat containing inguinal hernia Musculoskeletal: No acute bone abnormality within the abdomen and pelvis. Osseous structures are age-appropriate. IMPRESSION: No acute intracranial injury. No calvarial fracture. Small left frontal scalp hematoma. No acute facial fracture. Mild left preseptal and infraorbital soft tissue swelling. No acute fracture or listhesis of the cervical spine. Acute left seventh rib fracture.  No pneumothorax. No acute intra-abdominal pathology. Additional incidental findings as noted above. Aortic Atherosclerosis (ICD10-I70.0).  Fall Electronically Signed   By: Fidela Salisbury M.D.   On: 08/29/2021 02:56   CT Maxillofacial Wo Contrast  Result Date: 08/29/2021 CLINICAL DATA:  Altered mental status, Unwitnessed fall, left rib pain EXAM: CT HEAD WITHOUT CONTRAST CT MAXILLOFACIAL WITHOUT CONTRAST CT CERVICAL SPINE WITHOUT CONTRAST CT CHEST, ABDOMEN AND PELVIS WITH CONTRAST TECHNIQUE: Contiguous axial images were obtained from the base of the skull through the vertex without intravenous contrast. Multidetector CT imaging of the maxillofacial structures was performed. Multiplanar CT image reconstructions were also generated. A small metallic BB was placed on the right temple in order to reliably differentiate  right from left. Multidetector CT imaging of the cervical spine was performed without intravenous contrast. Multiplanar CT image reconstructions were also generated. Multidetector CT imaging of the chest, abdomen and pelvis was performed following the standard protocol during bolus administration of intravenous contrast. CONTRAST:  141m OMNIPAQUE IOHEXOL 300 MG/ML  SOLN COMPARISON:  08/25/2021, 07/29/2020 FINDINGS: CT HEAD FINDINGS Brain: Normal anatomic configuration. Parenchymal volume loss is commensurate with the patient's age. Mild periventricular white matter changes are present likely reflecting the sequela of small vessel ischemia. No abnormal intra or extra-axial mass lesion or fluid collection. No abnormal mass effect or midline shift. No evidence of acute intracranial hemorrhage or infarct. Ventricular size is normal. Cerebellum unremarkable. Vascular: No asymmetric hyperdense vasculature  at the skull base. Skull: Intact Other: Mastoid air cells and middle ear cavities are clear. Small left frontal scalp hematoma noted. CT MAXILLOFACIAL FINDINGS Osseous: No fracture or mandibular dislocation. No destructive process. Orbits: Ocular lenses have been removed. The orbits are otherwise unremarkable. Sinuses: The paranasal sinuses are clear. Soft tissues: Mild left preseptal and infraorbital soft tissue swelling. CT CERVICAL SPINE FINDINGS Alignment: 2 mm anterolisthesis of C3 upon C4 is unchanged. Skull base and vertebrae: Craniocervical alignment is normal. Atlantodental interval is not widened. No acute fracture the cervical spine. Vertebral body height has been preserved. Soft tissues and spinal canal: No prevertebral fluid or swelling. No visible canal hematoma. Disc levels: Intervertebral disc space narrowing and endplate remodeling at Y8-0 and C6-7 is in keeping with changes of moderate degenerative disc disease. Minimal degenerative changes are seen throughout the remainder of the cervical spine. The  prevertebral soft tissues are not thickened on sagittal reformats. Mild central canal stenosis at C3-4 secondary to anterolisthesis and associated posterior disc osteophyte complex. Review of the axial images demonstrates multilevel uncovertebral and facet arthrosis resulting in moderate left and mild right neuroforaminal narrowing at C3-4 and C4-5. Other: None CT CHEST FINDINGS Cardiovascular: Moderate coronary artery calcification. Global cardiac size within normal limits. No pericardial effusion. Central pulmonary arteries are of normal caliber. The thoracic aorta is unremarkable. Left subclavian dual lead pacemaker is in place with leads within the right atrium and right ventricle. Left internal jugular chest port tip is seen within the superior vena cava. Mediastinum/Nodes: No enlarged mediastinal, hilar, or axillary lymph nodes. Thyroid gland, trachea, and esophagus demonstrate no significant findings. Lungs/Pleura: Mild basilar predominant pulmonary fibrotic change. No focal pulmonary infiltrate. No pneumothorax or pleural effusion. No central obstructing mass. Musculoskeletal: An acute minimally displaced fracture of the left seventh rib is seen laterally. Multiple healed bilateral rib fractures are noted. CT ABDOMEN AND PELVIS FINDINGS Hepatobiliary: No focal liver abnormality is seen. Status post cholecystectomy. No biliary dilatation. Pancreas: Unremarkable Spleen: Unremarkable Adrenals/Urinary Tract: Adrenal glands are unremarkable. Status post right nephrectomy. Left kidney is normal in size and position. Stable probable hyperdense renal cyst arising from the lower pole of the left kidney. Left kidney is otherwise unremarkable. Bladder unremarkable. Stomach/Bowel: The stomach, small bowel, and large bowel are unremarkable. No free intraperitoneal gas or fluid. Vascular/Lymphatic: Aortic atherosclerosis. No enlarged abdominal or pelvic lymph nodes. Reproductive: Mild prostatic enlargement. Other: Tiny  left fat containing inguinal hernia Musculoskeletal: No acute bone abnormality within the abdomen and pelvis. Osseous structures are age-appropriate. IMPRESSION: No acute intracranial injury. No calvarial fracture. Small left frontal scalp hematoma. No acute facial fracture. Mild left preseptal and infraorbital soft tissue swelling. No acute fracture or listhesis of the cervical spine. Acute left seventh rib fracture.  No pneumothorax. No acute intra-abdominal pathology. Additional incidental findings as noted above. Aortic Atherosclerosis (ICD10-I70.0).  Fall Electronically Signed   By: Fidela Salisbury M.D.   On: 08/29/2021 02:56       Assessment & Plan:   Problem List Items Addressed This Visit     Aortic atherosclerosis ([place])    Continue pravastatin.       Cardiomyopathy, idiopathic (HCC)    On lasix.  Breathing stable.  Discussed weighing daily.  Low sodium diet.  Follow.       Chronic systolic CHF (congestive heart failure) (HCC)    On lasix.  Discussed the need to follow weight.   Breathing stable.       CKD (chronic kidney disease) stage 3,  GFR 30-59 ml/min (HCC)  °  Has been evaluated by Dr Singh.  Avoid antiinflammatories.  Follow metabolic panel.  °  °  ° Diabetes (HCC)  °  Being followed by endocrinology as outlined.  Discussed importance of eating regular meals and avoiding taking insulin when not eating, etc.  Discussed importance of f/u with Dr O'connell.   °  °  ° Diabetic foot ulcer associated with type 1 diabetes mellitus (HCC)  °  Followed by vascular surgery and Dr O'Connell.  Has insulin pump.  Was disconnected in hospital.  He is using his insulin pump now.  Did not keep his endocrinology appt we had scheduled.  Discussed today the need for f/u with Dr O'connell.  Discussed importance of eating regular meals.  Follow met b and a1c.  °  °  ° Emphysema lung (HCC)  °  Breathing stable.  °  °  ° Essential hypertension, benign - Primary  °  On coreg and lasix.  Blood pressure as  outlined.  Follow pressures.  Follow metabolic panel.  °  °  ° Relevant Orders  ° CBC with Differential/Platelet (Completed)  ° BASIC METABOLIC PANEL WITH GFR (Completed)  ° Hypercholesterolemia  °  Continue pravastatin.  Low cholesterol diet and exercise.  Follow lipid panel and liver function tests.  °  °  ° Iron deficiency anemia  °  Follow cbc and iron studies.   °  °  ° Left rib fracture_7th rib  °  Discharged to rehab.  Worked with therapy. No pain today.  °  °  ° Lymphedema  °  Legs wrapped.  Sees vascular surgery regularly for wrapping and f/u.  °  °  ° Mantle cell lymphoma (HCC)  °  Has been followed by oncology.  °  °  ° Sick sinus syndrome (HCC)  °  S/p pacemaker placement.  Followed by cardiology.  °  °  ° Thyroid disease  °  Followed by endocrinology.  °  °  ° ° ° ° , MD  °

## 2021-10-18 ENCOUNTER — Other Ambulatory Visit: Payer: Self-pay | Admitting: Internal Medicine

## 2021-10-18 DIAGNOSIS — E876 Hypokalemia: Secondary | ICD-10-CM

## 2021-10-18 LAB — CBC WITH DIFFERENTIAL/PLATELET
Absolute Monocytes: 348 cells/uL (ref 200–950)
Basophils Absolute: 52 cells/uL (ref 0–200)
Basophils Relative: 1 %
Eosinophils Absolute: 192 cells/uL (ref 15–500)
Eosinophils Relative: 3.7 %
HCT: 39.5 % (ref 38.5–50.0)
Hemoglobin: 13.1 g/dL — ABNORMAL LOW (ref 13.2–17.1)
Lymphs Abs: 931 cells/uL (ref 850–3900)
MCH: 31 pg (ref 27.0–33.0)
MCHC: 33.2 g/dL (ref 32.0–36.0)
MCV: 93.4 fL (ref 80.0–100.0)
MPV: 8.4 fL (ref 7.5–12.5)
Monocytes Relative: 6.7 %
Neutro Abs: 3676 cells/uL (ref 1500–7800)
Neutrophils Relative %: 70.7 %
Platelets: 149 10*3/uL (ref 140–400)
RBC: 4.23 10*6/uL (ref 4.20–5.80)
RDW: 13.1 % (ref 11.0–15.0)
Total Lymphocyte: 17.9 %
WBC: 5.2 10*3/uL (ref 3.8–10.8)

## 2021-10-18 LAB — BASIC METABOLIC PANEL WITH GFR
BUN/Creatinine Ratio: 14 (calc) (ref 6–22)
BUN: 19 mg/dL (ref 7–25)
CO2: 25 mmol/L (ref 20–32)
Calcium: 8.1 mg/dL — ABNORMAL LOW (ref 8.6–10.3)
Chloride: 103 mmol/L (ref 98–110)
Creat: 1.38 mg/dL — ABNORMAL HIGH (ref 0.70–1.28)
Glucose, Bld: 191 mg/dL — ABNORMAL HIGH (ref 65–99)
Potassium: 3.4 mmol/L — ABNORMAL LOW (ref 3.5–5.3)
Sodium: 141 mmol/L (ref 135–146)
eGFR: 54 mL/min/{1.73_m2} — ABNORMAL LOW (ref >=60)

## 2021-10-18 NOTE — Progress Notes (Signed)
Order placed for f/u potassium check.  

## 2021-10-19 ENCOUNTER — Encounter: Payer: Self-pay | Admitting: Internal Medicine

## 2021-10-19 ENCOUNTER — Encounter (INDEPENDENT_AMBULATORY_CARE_PROVIDER_SITE_OTHER): Payer: Self-pay | Admitting: Nurse Practitioner

## 2021-10-19 NOTE — Assessment & Plan Note (Signed)
Continue pravastatin.  Low cholesterol diet and exercise.  Follow lipid panel and liver function tests.   

## 2021-10-19 NOTE — Assessment & Plan Note (Signed)
Followed by vascular surgery and Dr Honor Junes.  Has insulin pump.  Was disconnected in hospital.  He is using his insulin pump now.  Did not keep his endocrinology appt we had scheduled.  Discussed today the need for f/u with Dr Honor Junes.  Discussed importance of eating regular meals.  Follow met b and a1c.

## 2021-10-19 NOTE — Assessment & Plan Note (Signed)
Followed by endocrinology 

## 2021-10-19 NOTE — Assessment & Plan Note (Signed)
Discharged to rehab.  Worked with therapy. No pain today.

## 2021-10-19 NOTE — Assessment & Plan Note (Signed)
Follow cbc and iron studies.  

## 2021-10-19 NOTE — Assessment & Plan Note (Signed)
Has been evaluated by Dr Singh.  Avoid antiinflammatories.  Follow metabolic panel.  

## 2021-10-19 NOTE — Assessment & Plan Note (Signed)
Has been followed by oncology.  

## 2021-10-19 NOTE — Assessment & Plan Note (Signed)
Being followed by endocrinology as outlined.  Discussed importance of eating regular meals and avoiding taking insulin when not eating, etc.  Discussed importance of f/u with Dr Honor Junes.

## 2021-10-19 NOTE — Assessment & Plan Note (Signed)
Continue pravastatin 

## 2021-10-19 NOTE — Assessment & Plan Note (Signed)
On coreg and lasix.  Blood pressure as outlined.  Follow pressures.  Follow metabolic panel.  

## 2021-10-19 NOTE — Assessment & Plan Note (Signed)
On lasix.  Discussed the need to follow weight.   Breathing stable.  

## 2021-10-19 NOTE — Assessment & Plan Note (Signed)
On lasix.  Breathing stable.  Discussed weighing daily.  Low sodium diet.  Follow.

## 2021-10-19 NOTE — Assessment & Plan Note (Signed)
S/p pacemaker placement.  Followed by cardiology.  

## 2021-10-19 NOTE — Assessment & Plan Note (Signed)
Breathing stable.

## 2021-10-19 NOTE — Assessment & Plan Note (Signed)
Legs wrapped.  Sees vascular surgery regularly for wrapping and f/u.

## 2021-10-20 DIAGNOSIS — I130 Hypertensive heart and chronic kidney disease with heart failure and stage 1 through stage 4 chronic kidney disease, or unspecified chronic kidney disease: Secondary | ICD-10-CM | POA: Diagnosis not present

## 2021-10-20 DIAGNOSIS — M6281 Muscle weakness (generalized): Secondary | ICD-10-CM | POA: Diagnosis not present

## 2021-10-20 DIAGNOSIS — S2232XD Fracture of one rib, left side, subsequent encounter for fracture with routine healing: Secondary | ICD-10-CM | POA: Diagnosis not present

## 2021-10-20 DIAGNOSIS — Z9181 History of falling: Secondary | ICD-10-CM | POA: Diagnosis not present

## 2021-10-20 DIAGNOSIS — E1065 Type 1 diabetes mellitus with hyperglycemia: Secondary | ICD-10-CM | POA: Diagnosis not present

## 2021-10-20 DIAGNOSIS — E10649 Type 1 diabetes mellitus with hypoglycemia without coma: Secondary | ICD-10-CM | POA: Diagnosis not present

## 2021-10-21 DIAGNOSIS — S2232XD Fracture of one rib, left side, subsequent encounter for fracture with routine healing: Secondary | ICD-10-CM | POA: Diagnosis not present

## 2021-10-21 DIAGNOSIS — M6281 Muscle weakness (generalized): Secondary | ICD-10-CM | POA: Diagnosis not present

## 2021-10-21 DIAGNOSIS — E1065 Type 1 diabetes mellitus with hyperglycemia: Secondary | ICD-10-CM | POA: Diagnosis not present

## 2021-10-21 DIAGNOSIS — Z9181 History of falling: Secondary | ICD-10-CM | POA: Diagnosis not present

## 2021-10-21 DIAGNOSIS — I130 Hypertensive heart and chronic kidney disease with heart failure and stage 1 through stage 4 chronic kidney disease, or unspecified chronic kidney disease: Secondary | ICD-10-CM | POA: Diagnosis not present

## 2021-10-21 DIAGNOSIS — E10649 Type 1 diabetes mellitus with hypoglycemia without coma: Secondary | ICD-10-CM | POA: Diagnosis not present

## 2021-10-22 ENCOUNTER — Encounter (INDEPENDENT_AMBULATORY_CARE_PROVIDER_SITE_OTHER): Payer: Self-pay

## 2021-10-22 ENCOUNTER — Other Ambulatory Visit: Payer: Self-pay

## 2021-10-22 ENCOUNTER — Ambulatory Visit (INDEPENDENT_AMBULATORY_CARE_PROVIDER_SITE_OTHER): Payer: Medicare Other | Admitting: *Deleted

## 2021-10-22 ENCOUNTER — Ambulatory Visit (INDEPENDENT_AMBULATORY_CARE_PROVIDER_SITE_OTHER): Payer: Medicare Other | Admitting: Nurse Practitioner

## 2021-10-22 VITALS — BP 143/75 | HR 80 | Resp 16 | Wt 213.2 lb

## 2021-10-22 DIAGNOSIS — I890 Lymphedema, not elsewhere classified: Secondary | ICD-10-CM | POA: Diagnosis not present

## 2021-10-22 DIAGNOSIS — I10 Essential (primary) hypertension: Secondary | ICD-10-CM

## 2021-10-22 DIAGNOSIS — I5022 Chronic systolic (congestive) heart failure: Secondary | ICD-10-CM

## 2021-10-22 DIAGNOSIS — E1151 Type 2 diabetes mellitus with diabetic peripheral angiopathy without gangrene: Secondary | ICD-10-CM

## 2021-10-22 NOTE — Patient Instructions (Addendum)
Visit Information  Thank you for taking time to visit with me today. Please don't hesitate to contact me if I can be of assistance to you before our next scheduled telephone appointment.  Following are the goals we discussed today:  Check blood sugar at least 5 times a day Check blood sugar if I feel it is too high or too low Take the blood sugar meter and log to all doctor visits  Consider drinking Boost or other supplement in the evenings if not eating a complete meal Make sure to have candy or glucose tablets on you at all times; try drinking coke along with eating snack to treat hypoglycemia Eat at least 3 meals a day with snack in between to help prevent hypoglycemia Please consider/participate with Home Health Nursing/Therapy involvement Fall precautions and preventions; Wear life alert at all times Contact Endocrinology for reinstating insulin pump Weigh self daily using walker to balance Low salt carb modified diet  Our next appointment is by telephone on 1/25 at 1315  Please call the care guide team at [phone] if you need to cancel or reschedule your appointment.   If you are experiencing a Mental Health or [place] or need someone to talk to, please call the Suicide and Crisis Lifeline: 988 call the [place] National Suicide Prevention Lifeline: (463)686-5800 or TTY: 339-162-8765 TTY (506) 463-7516) to talk to a trained counselor call 1-800-273-TALK (toll free, 24 hour hotline) call 911   Patient verbalizes understanding of instructions and care plan provided today and agrees to view in [place]. Active MyChart status confirmed with patient.    [name] RN, MSN RN Care Management Coordinator [place] [phone] [email or web address]

## 2021-10-22 NOTE — Progress Notes (Signed)
History of Present Illness  There is no documented history at this time  Assessments & Plan   There are no diagnoses linked to this encounter.    Additional instructions  Subjective:  Patient presents with venous ulcer of the Bilateral lower extremity.    Procedure:  3 layer unna wrap was placed Bilateral lower extremity.   Plan:   Follow up in one week.  

## 2021-10-22 NOTE — Chronic Care Management (AMB) (Signed)
Chronic Care Management   CCM RN Visit Note  10/22/2021 Name: Gary [name] MRN: [id number] DOB: 1946-04-19  Subjective: Gary [name] is a 76 y.o. year old male who is a primary care patient of [name], MD. The care management team was consulted for assistance with disease management and care coordination needs.    Engaged with patient by telephone for follow up visit in response to provider referral for case management and/or care coordination services.   Consent to Services:  The patient was given information about Chronic Care Management services, agreed to services, and gave verbal consent prior to initiation of services.  Please see initial visit note for detailed documentation.   Patient agreed to services and verbal consent obtained.   Assessment: Review of patient past medical history, allergies, medications, health status, including review of consultants reports, laboratory and other test data, was performed as part of comprehensive evaluation and provision of chronic care management services.   SDOH (Social Determinants of Health) assessments and interventions performed:    CCM Care Plan  Allergies  Allergen Reactions   Rofecoxib Nausea Only    Outpatient Encounter Medications as of 10/22/2021  Medication Sig   acetaminophen (TYLENOL) 325 MG tablet Take 650 mg by mouth every 6 (six) hours as needed.   bacitracin 500 UNIT/GM ointment Apply 1 application topically daily.   carvedilol (COREG) 3.125 MG tablet Take 1 tablet (3.125 mg total) by mouth 2 (two) times daily with a meal. Hold if systolic <950 and HR <93 (Patient not taking: Reported on 10/17/2021)   Cholecalciferol (VITAMIN D3) 50 MCG (2000 UT) CAPS Take 2,000 Units by mouth daily.   feeding supplement, GLUCERNA SHAKE, (GLUCERNA SHAKE) LIQD Take 237 mLs by mouth 2 (two) times daily between meals.   ferrous sulfate 325 (65 FE) MG tablet Take 325 mg by mouth daily with breakfast.   FLUoxetine (PROZAC) 20 MG  capsule Take 1 capsule (20 mg total) by mouth daily.   furosemide (LASIX) 40 MG tablet Take 1 tablet (40 mg total) by mouth daily.   glucose 4 GM chewable tablet Chew 1 tablet (4 g total) by mouth once as needed for low blood sugar.   insulin aspart (NOVOLOG) 100 UNIT/ML injection Inject 0-9 Units into the skin 3 (three) times daily with meals. CBG 70 - 120: 0 units CBG 121 - 150: 1 unit CBG 151 - 200: 2 units CBG 201 - 250: 3 units CBG 251 - 300: 5 units CBG 301 - 350: 7 units CBG 351 - 400: 9 units   insulin aspart (NOVOLOG) 100 UNIT/ML injection Inject 0-5 Units into the skin at bedtime. CBG 70 - 120: 0 units CBG 121 - 150: 0 units CBG 151 - 200: 0 units CBG 201 - 250: 2 units CBG 251 - 300: 3 units CBG 301 - 350: 4 units CBG 351 - 400: 5 units   insulin aspart (NOVOLOG) 100 UNIT/ML injection Inject 6 Units into the skin 3 (three) times daily with meals.   insulin glargine-yfgn (SEMGLEE) 100 UNIT/ML injection Inject 0.25 mLs (25 Units total) into the skin daily. (Patient taking differently: Inject 16 Units into the skin daily.)   levothyroxine (SYNTHROID) 25 MCG tablet Take 25 mcg by mouth daily before breakfast.   lidocaine (LIDODERM) 5 % Place 1 patch onto the skin daily. Remove & Discard patch within 12 hours or as directed by MD   methocarbamol (ROBAXIN) 750 MG tablet Take 1 tablet (750 mg total) by mouth every 6 (  six) hours as needed for muscle spasms.   polyethylene glycol (MIRALAX / GLYCOLAX) 17 g packet Take 17 g by mouth 2 (two) times daily.   polyvinyl alcohol (LIQUIFILM TEARS) 1.4 % ophthalmic solution Place 1 drop into both eyes as needed for dry eyes.   pravastatin (PRAVACHOL) 40 MG tablet Take 40 mg by mouth daily.    vitamin B-12 (CYANOCOBALAMIN) 1000 MCG tablet Take 1,000 mcg by mouth daily.   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    Left rib fracture_7th rib    Leukocytosis    HTN (hypertension)    Hypothyroid    CAD (coronary artery disease)    Porokeratosis 05/15/2021   Aortic atherosclerosis ([place]) 05/05/2021   Emphysema lung ([place]) 05/05/2021   Thrombocytopenia ([place]) 04/29/2021   Splinter of foot without infection 02/23/2021   Arm laceration 02/09/2021   Hypokalemia 10/24/2020   Injury by nail 08/15/2020   Shingles 07/24/2020   Rash 07/02/2020   Colon cancer screening 07/02/2020   Sick sinus syndrome ([place]) 12/28/2019   Acquired absence of kidney 09/13/2019   Edema of lower extremity 09/13/2019   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   Symptomatic bradycardia 12/02/2017   CKD (chronic kidney disease), stage IIIa 41/93/7902   Chronic systolic CHF (congestive heart failure) ([place]) 12/02/2017   Saturday night paralysis 11/12/2017   Hypoglycemia 10/28/2017   Goals of care, counseling/discussion 10/24/2017   Lymphedema 10/20/2017   Iron deficiency anemia 09/11/2017   Fall 08/13/2017   Humerus fracture 08/13/2017   Anemia  01/12/2017   Bilateral carotid artery stenosis 06/29/2016   Hand laceration 12/16/2015   Adjustment disorder with depressed mood 08/11/2015   Cardiomyopathy, idiopathic ([place]) 04/22/2015   CAD in native artery 11/02/2014   Mantle cell lymphoma ([place]) 08/03/2014   History of colonic polyps 04/29/2014   Irregular heart beat 04/22/2014   SOB (shortness of breath) 04/22/2014   Stress 03/21/2014   PVC (premature ventricular contraction) 02/21/2014   GERD (gastroesophageal reflux disease) 10/23/2013   Gastroparesis 06/25/2013   Chest pain 04/13/2013   Thyroid disease 04/13/2013   Diabetes ([place]) 04/13/2013   Essential hypertension, benign 04/13/2013   Hypercholesterolemia 04/13/2013    Conditions to be addressed/monitored:CHF and DMII  Care Plan : Diabetes  Updates made by Leona Singleton, RN since 10/22/2021 12:00 AM     Problem: Hypoglycemia causing syncopal episodes   Priority: Medium     Long-Range Goal:  Patient will report maintaing Hgb A1C of 7 or below in the next 90 days   Start Date: 01/27/2021  Expected End Date: 02/08/2022  Recent Progress: Not on track  Priority: Medium  Note:   Objective:  Lab Results  Component Value Date   HGBA1C 7.0 01/23/2021  Current Barriers:  Knowledge Deficits related to basic Diabetes pathophysiology and self care/management as evidenced by multiple syncopal episodes related to hypoglycemia.  Latest Hgb A1C 6.9 on 08/12/21.  Just discharged from SNF few weeks from  recent hospitalization for fall and hypoglycemia.  States he turned his insulin pump back on when he returned home.  Has not poken with Endocrino;ogy.  Reports blood sugars have ranged 120's with a few hypoglycemic events in th 50's.  Fasting this morning 50.  Instructed to contact Endocrnologist as soon as possible.  Does report the home health agency has made contact (unsure  of the name), he is just waiting for them to come out (1/5 or 1/6).  Denies any falls since discharge  home. Limited Social Support Trouble getting insulin pump supplies-reports he now has insulin pump sensors  1/11-Reports waking to hypoglycemic alarm of 60 this morning.  Has had few more hypoglycemic episode lowest of 50.  Did turn pump off this morning o treat hypoglycemia.  Denies falls since discharge.  Still not spoken with Endocrinology.  States he has tried  to leave message.  Offered  to assist with calling Endocrinology, declined assistance. Weight this morning 213 pounds, BP 120/60s.  Does report no longer taking Coreg due to hypotension while at rehab.  Continues to have home health; denies any chest pain, shortness of breath, or swelling  Case Manager Clinical Goal(s):  patient will demonstrate improved adherence to prescribed treatment plan for diabetes self care/management as evidenced by: at least 4 times a day monitoring and recording of CBG, adherence to ADA/ carb modified diet, adherence to prescribed medication regimen, contacting provider for new or worsened symptoms or questions  Diabetes Interventions:  (Status: Goal on track: NO.)  Long Term Goal  Collaboration with [name], MD regarding development and update of comprehensive plan of care as evidenced by provider attestation and co-signature Inter-disciplinary care team collaboration (see longitudinal plan of care) Provided education to patient about basic DM disease process Discussed medications and encouraged medication compliance Discussed plans with patient for ongoing care management follow up and provided patient with direct contact information for care management team Encouraged continued participation with CCM Pharmacist  Reviewed glucose ranges and hypoglycemic events; and discussed dangers of hypoglycemia and treatment options, need for immediate sugar replacement with juice, sugar, soda in addition to snack or candy bar or glucose tablets Discussed proper treatment of hypoglycemia Discussed healthy meal  and snack options Encouraged to continue working with [place] or Therapy referral for safety evaluation after 2 falls, educating and discussing even if they start services, patient can cancel at any time Discussed and encouraged patient to be proactive with health instead of reactive after falls Fall precautions and preventions reviewed and discussed, encouraged to use walker with all ambulation,  Instructed and encouraged to wear life alert at all times Discussed logging blood sugars and writing down insulin doses taken to help provider manage medications; sent 2022 Calendar Booklet to help with keeping log of blood sugars and insulin dose Reviewed and discussed proper diabetic foot care, encouraged patient to keep check on right foot splinter wound for signs and symptoms of infection Discussed and encouraged and  reinforced patient to wear bedroom/house/slides while in the home and not to walk around bare foot Discussed with patient asking podiatrist for prescription for diabetic shoes Instructed & Encouraged to contact Endocrinology ASAP since turning pump back on for possible insulin adjustments;  offered assistants to contact endocrinology (declines at this time)  Heart Failure Interventions:  (Status: Goal on track: NO.)  Long Term Goal  Basic overview and discussion of pathophysiology of Heart Failure reviewed Provided education on low sodium diet Reviewed Heart Failure Action Plan in depth and provided written copy Discussed importance of daily weight and advised patient to weigh and record daily Reviewed role of diuretics in prevention of fluid overload and management of heart failure Discussed the importance of keeping all appointments with provider Provided patient with education about the role of exercise in the management of heart failure  Use walker to help stand on scale Fall precautions and preventions Patient Goals/Self-Care Activities Check blood sugar at least 5  times a day Check blood sugar if I feel it is too high or too low Take the blood sugar meter and log to all doctor visits  Consider drinking Boost or other supplement in the evenings if not eating a complete meal Make sure to have candy or glucose tablets on you at all times; try drinking coke along with eating snack to treat hypoglycemia Eat at least 3 meals a day with snack in between to help prevent hypoglycemia Please consider/participate with Home Health Nursing/Therapy involvement Fall precautions and preventions; Wear life alert at all times Contact Endocrinology for reinstating insulin pump Weigh self daily using walker to balance Low salt carb modified diet  Follow Up Plan: The care management team will reach out to the patient again over the next 21 business days.        Plan:The care management team will reach out to the patient again over the next 21 days.   [name] RN, MSN RN Care Management Coordinator [place] [phone] [email or web address]

## 2021-10-26 ENCOUNTER — Encounter (INDEPENDENT_AMBULATORY_CARE_PROVIDER_SITE_OTHER): Payer: Self-pay | Admitting: Nurse Practitioner

## 2021-10-27 DIAGNOSIS — Z9181 History of falling: Secondary | ICD-10-CM | POA: Diagnosis not present

## 2021-10-27 DIAGNOSIS — M6281 Muscle weakness (generalized): Secondary | ICD-10-CM | POA: Diagnosis not present

## 2021-10-27 DIAGNOSIS — E10649 Type 1 diabetes mellitus with hypoglycemia without coma: Secondary | ICD-10-CM | POA: Diagnosis not present

## 2021-10-27 DIAGNOSIS — E1065 Type 1 diabetes mellitus with hyperglycemia: Secondary | ICD-10-CM | POA: Diagnosis not present

## 2021-10-27 DIAGNOSIS — S2232XD Fracture of one rib, left side, subsequent encounter for fracture with routine healing: Secondary | ICD-10-CM | POA: Diagnosis not present

## 2021-10-27 DIAGNOSIS — I130 Hypertensive heart and chronic kidney disease with heart failure and stage 1 through stage 4 chronic kidney disease, or unspecified chronic kidney disease: Secondary | ICD-10-CM | POA: Diagnosis not present

## 2021-10-28 ENCOUNTER — Other Ambulatory Visit (INDEPENDENT_AMBULATORY_CARE_PROVIDER_SITE_OTHER): Payer: Medicare Other

## 2021-10-28 ENCOUNTER — Other Ambulatory Visit: Payer: Self-pay

## 2021-10-28 DIAGNOSIS — K3184 Gastroparesis: Secondary | ICD-10-CM

## 2021-10-28 DIAGNOSIS — Z950 Presence of cardiac pacemaker: Secondary | ICD-10-CM

## 2021-10-28 DIAGNOSIS — E039 Hypothyroidism, unspecified: Secondary | ICD-10-CM

## 2021-10-28 DIAGNOSIS — F32A Depression, unspecified: Secondary | ICD-10-CM

## 2021-10-28 DIAGNOSIS — H04123 Dry eye syndrome of bilateral lacrimal glands: Secondary | ICD-10-CM | POA: Diagnosis not present

## 2021-10-28 DIAGNOSIS — E1042 Type 1 diabetes mellitus with diabetic polyneuropathy: Secondary | ICD-10-CM | POA: Diagnosis not present

## 2021-10-28 DIAGNOSIS — E785 Hyperlipidemia, unspecified: Secondary | ICD-10-CM

## 2021-10-28 DIAGNOSIS — D509 Iron deficiency anemia, unspecified: Secondary | ICD-10-CM

## 2021-10-28 DIAGNOSIS — E1043 Type 1 diabetes mellitus with diabetic autonomic (poly)neuropathy: Secondary | ICD-10-CM

## 2021-10-28 DIAGNOSIS — I130 Hypertensive heart and chronic kidney disease with heart failure and stage 1 through stage 4 chronic kidney disease, or unspecified chronic kidney disease: Secondary | ICD-10-CM | POA: Diagnosis not present

## 2021-10-28 DIAGNOSIS — I5022 Chronic systolic (congestive) heart failure: Secondary | ICD-10-CM | POA: Diagnosis not present

## 2021-10-28 DIAGNOSIS — E0500 Thyrotoxicosis with diffuse goiter without thyrotoxic crisis or storm: Secondary | ICD-10-CM

## 2021-10-28 DIAGNOSIS — E1051 Type 1 diabetes mellitus with diabetic peripheral angiopathy without gangrene: Secondary | ICD-10-CM | POA: Diagnosis not present

## 2021-10-28 DIAGNOSIS — S2232XD Fracture of one rib, left side, subsequent encounter for fracture with routine healing: Secondary | ICD-10-CM | POA: Diagnosis not present

## 2021-10-28 DIAGNOSIS — E876 Hypokalemia: Secondary | ICD-10-CM | POA: Diagnosis not present

## 2021-10-28 DIAGNOSIS — E1022 Type 1 diabetes mellitus with diabetic chronic kidney disease: Secondary | ICD-10-CM | POA: Diagnosis not present

## 2021-10-28 DIAGNOSIS — E103519 Type 1 diabetes mellitus with proliferative diabetic retinopathy with macular edema, unspecified eye: Secondary | ICD-10-CM

## 2021-10-28 DIAGNOSIS — K5900 Constipation, unspecified: Secondary | ICD-10-CM

## 2021-10-28 DIAGNOSIS — J439 Emphysema, unspecified: Secondary | ICD-10-CM

## 2021-10-28 DIAGNOSIS — Z9181 History of falling: Secondary | ICD-10-CM | POA: Diagnosis not present

## 2021-10-28 DIAGNOSIS — Z794 Long term (current) use of insulin: Secondary | ICD-10-CM

## 2021-10-28 DIAGNOSIS — N1831 Chronic kidney disease, stage 3a: Secondary | ICD-10-CM | POA: Diagnosis not present

## 2021-10-28 DIAGNOSIS — E569 Vitamin deficiency, unspecified: Secondary | ICD-10-CM

## 2021-10-28 DIAGNOSIS — M6281 Muscle weakness (generalized): Secondary | ICD-10-CM | POA: Diagnosis not present

## 2021-10-28 DIAGNOSIS — Z720 Tobacco use: Secondary | ICD-10-CM

## 2021-10-28 DIAGNOSIS — E1065 Type 1 diabetes mellitus with hyperglycemia: Secondary | ICD-10-CM | POA: Diagnosis not present

## 2021-10-28 DIAGNOSIS — I890 Lymphedema, not elsewhere classified: Secondary | ICD-10-CM

## 2021-10-28 DIAGNOSIS — I2510 Atherosclerotic heart disease of native coronary artery without angina pectoris: Secondary | ICD-10-CM

## 2021-10-28 DIAGNOSIS — E10649 Type 1 diabetes mellitus with hypoglycemia without coma: Secondary | ICD-10-CM | POA: Diagnosis not present

## 2021-10-28 DIAGNOSIS — Z9641 Presence of insulin pump (external) (internal): Secondary | ICD-10-CM

## 2021-10-28 DIAGNOSIS — I872 Venous insufficiency (chronic) (peripheral): Secondary | ICD-10-CM

## 2021-10-29 ENCOUNTER — Ambulatory Visit (INDEPENDENT_AMBULATORY_CARE_PROVIDER_SITE_OTHER): Payer: Medicare Other | Admitting: Nurse Practitioner

## 2021-10-29 VITALS — BP 147/74 | HR 78 | Resp 17 | Ht 70.0 in | Wt 218.0 lb

## 2021-10-29 DIAGNOSIS — I890 Lymphedema, not elsewhere classified: Secondary | ICD-10-CM | POA: Diagnosis not present

## 2021-10-29 LAB — POTASSIUM: Potassium: 3.4 mEq/L — ABNORMAL LOW (ref 3.5–5.1)

## 2021-10-29 NOTE — Progress Notes (Signed)
History of Present Illness  There is no documented history at this time  Assessments & Plan   There are no diagnoses linked to this encounter.    Additional instructions  Subjective:  Patient presents with venous ulcer of the Bilateral lower extremity.    Procedure:  3 layer unna wrap was placed Bilateral lower extremity.   Plan:   Follow up in one week.  

## 2021-10-30 ENCOUNTER — Other Ambulatory Visit: Payer: Self-pay

## 2021-10-30 DIAGNOSIS — E1043 Type 1 diabetes mellitus with diabetic autonomic (poly)neuropathy: Secondary | ICD-10-CM | POA: Diagnosis not present

## 2021-10-30 DIAGNOSIS — S2232XD Fracture of one rib, left side, subsequent encounter for fracture with routine healing: Secondary | ICD-10-CM | POA: Diagnosis not present

## 2021-10-30 DIAGNOSIS — E0500 Thyrotoxicosis with diffuse goiter without thyrotoxic crisis or storm: Secondary | ICD-10-CM | POA: Diagnosis not present

## 2021-10-30 DIAGNOSIS — E785 Hyperlipidemia, unspecified: Secondary | ICD-10-CM | POA: Diagnosis not present

## 2021-10-30 DIAGNOSIS — I130 Hypertensive heart and chronic kidney disease with heart failure and stage 1 through stage 4 chronic kidney disease, or unspecified chronic kidney disease: Secondary | ICD-10-CM | POA: Diagnosis not present

## 2021-10-30 DIAGNOSIS — E10649 Type 1 diabetes mellitus with hypoglycemia without coma: Secondary | ICD-10-CM | POA: Diagnosis not present

## 2021-10-30 DIAGNOSIS — E1022 Type 1 diabetes mellitus with diabetic chronic kidney disease: Secondary | ICD-10-CM | POA: Diagnosis not present

## 2021-10-30 DIAGNOSIS — J439 Emphysema, unspecified: Secondary | ICD-10-CM | POA: Diagnosis not present

## 2021-10-30 DIAGNOSIS — E103519 Type 1 diabetes mellitus with proliferative diabetic retinopathy with macular edema, unspecified eye: Secondary | ICD-10-CM | POA: Diagnosis not present

## 2021-10-30 DIAGNOSIS — E569 Vitamin deficiency, unspecified: Secondary | ICD-10-CM | POA: Diagnosis not present

## 2021-10-30 DIAGNOSIS — H04123 Dry eye syndrome of bilateral lacrimal glands: Secondary | ICD-10-CM | POA: Diagnosis not present

## 2021-10-30 DIAGNOSIS — D509 Iron deficiency anemia, unspecified: Secondary | ICD-10-CM | POA: Diagnosis not present

## 2021-10-30 DIAGNOSIS — E1042 Type 1 diabetes mellitus with diabetic polyneuropathy: Secondary | ICD-10-CM | POA: Diagnosis not present

## 2021-10-30 DIAGNOSIS — I2510 Atherosclerotic heart disease of native coronary artery without angina pectoris: Secondary | ICD-10-CM | POA: Diagnosis not present

## 2021-10-30 DIAGNOSIS — E876 Hypokalemia: Secondary | ICD-10-CM

## 2021-10-30 DIAGNOSIS — E1051 Type 1 diabetes mellitus with diabetic peripheral angiopathy without gangrene: Secondary | ICD-10-CM | POA: Diagnosis not present

## 2021-10-30 DIAGNOSIS — K3184 Gastroparesis: Secondary | ICD-10-CM | POA: Diagnosis not present

## 2021-10-30 DIAGNOSIS — E039 Hypothyroidism, unspecified: Secondary | ICD-10-CM | POA: Diagnosis not present

## 2021-10-30 DIAGNOSIS — E1065 Type 1 diabetes mellitus with hyperglycemia: Secondary | ICD-10-CM | POA: Diagnosis not present

## 2021-10-30 DIAGNOSIS — M6281 Muscle weakness (generalized): Secondary | ICD-10-CM | POA: Diagnosis not present

## 2021-10-30 DIAGNOSIS — I872 Venous insufficiency (chronic) (peripheral): Secondary | ICD-10-CM | POA: Diagnosis not present

## 2021-10-30 DIAGNOSIS — I890 Lymphedema, not elsewhere classified: Secondary | ICD-10-CM | POA: Diagnosis not present

## 2021-10-30 DIAGNOSIS — F32A Depression, unspecified: Secondary | ICD-10-CM | POA: Diagnosis not present

## 2021-10-30 DIAGNOSIS — Z9181 History of falling: Secondary | ICD-10-CM | POA: Diagnosis not present

## 2021-10-30 DIAGNOSIS — I5022 Chronic systolic (congestive) heart failure: Secondary | ICD-10-CM | POA: Diagnosis not present

## 2021-10-30 DIAGNOSIS — N1831 Chronic kidney disease, stage 3a: Secondary | ICD-10-CM | POA: Diagnosis not present

## 2021-11-02 ENCOUNTER — Encounter (INDEPENDENT_AMBULATORY_CARE_PROVIDER_SITE_OTHER): Payer: Self-pay | Admitting: Nurse Practitioner

## 2021-11-03 DIAGNOSIS — S2232XD Fracture of one rib, left side, subsequent encounter for fracture with routine healing: Secondary | ICD-10-CM | POA: Diagnosis not present

## 2021-11-03 DIAGNOSIS — M6281 Muscle weakness (generalized): Secondary | ICD-10-CM | POA: Diagnosis not present

## 2021-11-03 DIAGNOSIS — E1065 Type 1 diabetes mellitus with hyperglycemia: Secondary | ICD-10-CM | POA: Diagnosis not present

## 2021-11-03 DIAGNOSIS — E10649 Type 1 diabetes mellitus with hypoglycemia without coma: Secondary | ICD-10-CM | POA: Diagnosis not present

## 2021-11-03 DIAGNOSIS — I130 Hypertensive heart and chronic kidney disease with heart failure and stage 1 through stage 4 chronic kidney disease, or unspecified chronic kidney disease: Secondary | ICD-10-CM | POA: Diagnosis not present

## 2021-11-03 DIAGNOSIS — Z9181 History of falling: Secondary | ICD-10-CM | POA: Diagnosis not present

## 2021-11-05 ENCOUNTER — Ambulatory Visit: Payer: Medicare Other | Admitting: *Deleted

## 2021-11-05 ENCOUNTER — Other Ambulatory Visit: Payer: Self-pay

## 2021-11-05 ENCOUNTER — Ambulatory Visit (INDEPENDENT_AMBULATORY_CARE_PROVIDER_SITE_OTHER): Payer: Medicare Other | Admitting: Nurse Practitioner

## 2021-11-05 VITALS — BP 124/70 | HR 72 | Resp 17 | Ht 70.0 in | Wt 218.0 lb

## 2021-11-05 DIAGNOSIS — I10 Essential (primary) hypertension: Secondary | ICD-10-CM

## 2021-11-05 DIAGNOSIS — I890 Lymphedema, not elsewhere classified: Secondary | ICD-10-CM | POA: Diagnosis not present

## 2021-11-05 DIAGNOSIS — I5022 Chronic systolic (congestive) heart failure: Secondary | ICD-10-CM

## 2021-11-05 DIAGNOSIS — E1151 Type 2 diabetes mellitus with diabetic peripheral angiopathy without gangrene: Secondary | ICD-10-CM

## 2021-11-05 NOTE — Progress Notes (Signed)
History of Present Illness  There is no documented history at this time  Assessments & Plan   There are no diagnoses linked to this encounter.    Additional instructions  Subjective:  Patient presents with venous ulcer of the Bilateral lower extremity.    Procedure:  3 layer unna wrap was placed Bilateral lower extremity.   Plan:   Follow up in one week.  

## 2021-11-06 DIAGNOSIS — E10649 Type 1 diabetes mellitus with hypoglycemia without coma: Secondary | ICD-10-CM | POA: Diagnosis not present

## 2021-11-06 DIAGNOSIS — S2232XD Fracture of one rib, left side, subsequent encounter for fracture with routine healing: Secondary | ICD-10-CM | POA: Diagnosis not present

## 2021-11-06 DIAGNOSIS — E1065 Type 1 diabetes mellitus with hyperglycemia: Secondary | ICD-10-CM | POA: Diagnosis not present

## 2021-11-06 DIAGNOSIS — I130 Hypertensive heart and chronic kidney disease with heart failure and stage 1 through stage 4 chronic kidney disease, or unspecified chronic kidney disease: Secondary | ICD-10-CM | POA: Diagnosis not present

## 2021-11-06 DIAGNOSIS — M6281 Muscle weakness (generalized): Secondary | ICD-10-CM | POA: Diagnosis not present

## 2021-11-06 DIAGNOSIS — Z9181 History of falling: Secondary | ICD-10-CM | POA: Diagnosis not present

## 2021-11-07 NOTE — Chronic Care Management (AMB) (Signed)
Chronic Care Management   CCM RN Visit Note  11/07/2021 Name: Gary [name] MRN: [id number] DOB: Nov 24, 1945  Subjective: Gary [name] is a 76 y.o. year old male who is a primary care patient of [name], MD. The care management team was consulted for assistance with disease management and care coordination needs.    Engaged with patient by telephone for follow up visit in response to provider referral for case management and/or care coordination services.   Consent to Services:  The patient was given information about Chronic Care Management services, agreed to services, and gave verbal consent prior to initiation of services.  Please see initial visit note for detailed documentation.   Patient agreed to services and verbal consent obtained.   Assessment: Review of patient past medical history, allergies, medications, health status, including review of consultants reports, laboratory and other test data, was performed as part of comprehensive evaluation and provision of chronic care management services.   SDOH (Social Determinants of Health) assessments and interventions performed:    CCM Care Plan  Allergies  Allergen Reactions   Rofecoxib Nausea Only    Outpatient Encounter Medications as of 11/05/2021  Medication Sig   acetaminophen (TYLENOL) 325 MG tablet Take 650 mg by mouth every 6 (six) hours as needed.   bacitracin 500 UNIT/GM ointment Apply 1 application topically daily.   carvedilol (COREG) 3.125 MG tablet Take 1 tablet (3.125 mg total) by mouth 2 (two) times daily with a meal. Hold if systolic <921 and HR <19 (Patient not taking: Reported on 10/17/2021)   Cholecalciferol (VITAMIN D3) 50 MCG (2000 UT) CAPS Take 2,000 Units by mouth daily.   feeding supplement, GLUCERNA SHAKE, (GLUCERNA SHAKE) LIQD Take 237 mLs by mouth 2 (two) times daily between meals.   ferrous sulfate 325 (65 FE) MG tablet Take 325 mg by mouth daily with breakfast.   FLUoxetine (PROZAC) 20 MG  capsule Take 1 capsule (20 mg total) by mouth daily.   furosemide (LASIX) 40 MG tablet Take 1 tablet (40 mg total) by mouth daily.   glucose 4 GM chewable tablet Chew 1 tablet (4 g total) by mouth once as needed for low blood sugar.   insulin aspart (NOVOLOG) 100 UNIT/ML injection Inject 0-9 Units into the skin 3 (three) times daily with meals. CBG 70 - 120: 0 units CBG 121 - 150: 1 unit CBG 151 - 200: 2 units CBG 201 - 250: 3 units CBG 251 - 300: 5 units CBG 301 - 350: 7 units CBG 351 - 400: 9 units   insulin aspart (NOVOLOG) 100 UNIT/ML injection Inject 0-5 Units into the skin at bedtime. CBG 70 - 120: 0 units CBG 121 - 150: 0 units CBG 151 - 200: 0 units CBG 201 - 250: 2 units CBG 251 - 300: 3 units CBG 301 - 350: 4 units CBG 351 - 400: 5 units   insulin aspart (NOVOLOG) 100 UNIT/ML injection Inject 6 Units into the skin 3 (three) times daily with meals.   insulin glargine-yfgn (SEMGLEE) 100 UNIT/ML injection Inject 0.25 mLs (25 Units total) into the skin daily. (Patient taking differently: Inject 16 Units into the skin daily.)   levothyroxine (SYNTHROID) 25 MCG tablet Take 25 mcg by mouth daily before breakfast.   lidocaine (LIDODERM) 5 % Place 1 patch onto the skin daily. Remove & Discard patch within 12 hours or as directed by MD   methocarbamol (ROBAXIN) 750 MG tablet Take 1 tablet (750 mg total) by mouth every 6 (  six) hours as needed for muscle spasms.   polyethylene glycol (MIRALAX / GLYCOLAX) 17 g packet Take 17 g by mouth 2 (two) times daily.   polyvinyl alcohol (LIQUIFILM TEARS) 1.4 % ophthalmic solution Place 1 drop into both eyes as needed for dry eyes.   pravastatin (PRAVACHOL) 40 MG tablet Take 40 mg by mouth daily.    vitamin B-12 (CYANOCOBALAMIN) 1000 MCG tablet Take 1,000 mcg by mouth daily.   Facility-Administered Encounter Medications as of 11/05/2021  Medication   heparin lock flush 100 unit/mL   sodium chloride flush (NS) 0.9 % injection 10 mL   sodium chloride flush (NS) 0.9  % injection 10 mL    Patient Active Problem List   Diagnosis Date Noted   Recurrent falls 08/29/2021   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    Left rib fracture_7th rib    Leukocytosis    HTN (hypertension)    Hypothyroid    CAD (coronary artery disease)    Porokeratosis 05/15/2021   Aortic atherosclerosis ([place]) 05/05/2021   Emphysema lung ([place]) 05/05/2021   Thrombocytopenia ([place]) 04/29/2021   Splinter of foot without infection 02/23/2021   Arm laceration 02/09/2021   Hypokalemia 10/24/2020   Injury by nail 08/15/2020   Shingles 07/24/2020   Rash 07/02/2020   Colon cancer screening 07/02/2020   Sick sinus syndrome ([place]) 12/28/2019   Acquired absence of kidney 09/13/2019   Edema of lower extremity 09/13/2019   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   Symptomatic bradycardia 12/02/2017   CKD (chronic kidney disease), stage IIIa 60/07/9322   Chronic systolic CHF (congestive heart failure) ([place]) 12/02/2017   Saturday night paralysis 11/12/2017   Hypoglycemia 10/28/2017   Goals of care, counseling/discussion 10/24/2017   Lymphedema 10/20/2017   Iron deficiency anemia 09/11/2017   Fall 08/13/2017   Humerus fracture 08/13/2017   Anemia  01/12/2017   Bilateral carotid artery stenosis 06/29/2016   Hand laceration 12/16/2015   Adjustment disorder with depressed mood 08/11/2015   Cardiomyopathy, idiopathic ([place]) 04/22/2015   CAD in native artery 11/02/2014   Mantle cell lymphoma ([place]) 08/03/2014   History of colonic polyps 04/29/2014   Irregular heart beat 04/22/2014   SOB (shortness of breath) 04/22/2014   Stress 03/21/2014   PVC (premature ventricular contraction) 02/21/2014   GERD (gastroesophageal reflux disease) 10/23/2013   Gastroparesis 06/25/2013   Chest pain 04/13/2013   Thyroid disease 04/13/2013   Diabetes ([place]) 04/13/2013   Essential hypertension, benign 04/13/2013   Hypercholesterolemia 04/13/2013    Conditions to be addressed/monitored:CHF, HTN, and DMII  Care Plan : RNCM  Diabetes  Updates made by Leona Singleton, RN since 11/07/2021 12:00 AM     Problem: Hypoglycemia causing syncopal episodes   Priority: Medium  Long-Range Goal: Patient will report maintaing Hgb A1C of 7 or below in the next 90 days   Start Date: 01/27/2021  Expected End Date: 02/08/2022  Recent Progress: Not on track  Priority: Medium  Note:   Objective:  Lab Results  Component Value Date   HGBA1C 7.0 01/23/2021  Current Barriers:  Knowledge Deficits related to basic Diabetes pathophysiology and self care/management as evidenced by multiple syncopal episodes related to hypoglycemia.  Limited Social Support Trouble getting insulin pump supplies-reports he now has insulin pump sensors  Latest Hgb A1C 6.9 on 08/12/21.  Just discharged from SNF few weeks from  recent hospitalization for fall and hypoglycemia.  States he turned his insulin pump back on when he returned home.  Has not poken with Endocrino;ogy.  Reports blood sugars have ranged 120's with a few hypoglycemic events in th 50's.  Fasting this morning 50.  Instructed to contact Endocrnologist as soon as possible.  Does report the home health agency has made contact  (unsure  of the name), he is just waiting for them to come out (1/5 or 1/6).  Denies any falls since discharge home.  1/11-Reports waking to hypoglycemic alarm of 60 this morning.  Has had few more hypoglycemic episode lowest of 50.  Did turn pump off this morning o treat hypoglycemia.  Denies falls since discharge.  Still not spoken with Endocrinology.  States he has tried  to leave message.  Offered  to assist with calling Endocrinology, declined assistance. Weight this morning 213 pounds, BP 120/60s.  Does report no longer taking Coreg due to hypotension while at rehab.  Continues to have home health; denies any chest pain, shortness of breath, or swelling  1/25--Reports he is doing fair.  Has not been weighing at home due to needing batteries for home scale.  Still has not contacted Endocrinology about insulin pump; fasting blood sugar have ranged 90-120's with a few hypoglycemic event of 40-50's and highest of 400's.  BP 123/70.  Denies any recent falls since being home; continues to work with [facility] therapy    Case Manager Clinical Goal(s):  patient will demonstrate improved adherence to prescribed treatment plan for diabetes self care/management as evidenced by: at least 4 times a day monitoring and recording of CBG, adherence to ADA/ carb modified diet, adherence to prescribed medication regimen, contacting provider for new or worsened symptoms or questions  Diabetes Interventions:  (Status: Goal on track: NO.)  Long Term Goal  Collaboration with [name], MD regarding development and update of comprehensive plan of care as evidenced by provider attestation and co-signature Inter-disciplinary care team collaboration (see longitudinal plan of care) Provided education to patient about basic DM disease process Discussed medications and encouraged medication compliance Discussed plans with patient for ongoing care management follow up and provided patient with direct contact information for  care management team Encouraged continued participation with CCM Pharmacist  Reviewed glucose ranges and hypoglycemic events; and discussed dangers of hypoglycemia and treatment options, need for immediate sugar replacement with juice, sugar, soda in addition to snack or candy bar or glucose tablets Discussed proper treatment of hypoglycemia Discussed healthy meal and snack options Encouraged to continue working with [place] or Therapy referral for safety evaluation after 2 falls, educating and discussing even if they start services, patient can cancel at any time Discussed and encouraged patient to be proactive with health instead of reactive after falls Fall precautions and preventions reviewed and discussed, encouraged to use walker with all ambulation,  Instructed and encouraged to wear life alert at all times Discussed logging blood sugars and writing down insulin doses taken to help provider manage medications; sent 2022 Calendar Booklet to help with keeping log of blood sugars and insulin dose Reviewed and discussed proper diabetic foot care, encouraged patient to keep check on right foot splinter wound for signs and symptoms of infection Discussed and encouraged and reinforced patient to wear bedroom/house/slides while in the home and not to walk around bare foot Discussed with patient asking podiatrist for prescription for diabetic shoes Instructed & Encouraged to contact Endocrinology ASAP since turning pump back on for possible insulin adjustments;  offered assistants to contact endocrinology (AGAIN declines at this time)  Heart Failure Interventions:  (Status: Goal on track: NO.)  Long Term Goal  Basic overview and discussion of pathophysiology of Heart Failure reviewed Provided education on low sodium diet Reviewed Heart Failure Action Plan in depth and provided written copy Discussed importance of daily weight and advised patient to weigh and record daily Reviewed role  of diuretics in prevention of fluid overload and management of heart failure Discussed the importance of keeping all appointments with provider Provided patient with education about the role of exercise in the management of heart failure  Encouraged to request help when putting new batteries in scale Fall precautions and preventions  Patient Goals/Self-Care Activities Check blood sugar at least 5 times a day Check blood sugar if I feel it is too high or too low Take the blood sugar meter and log to all doctor visits  Consider drinking Boost or other supplement in the evenings if not eating a complete meal Make sure to have candy or glucose tablets on you at all times; try drinking coke along with eating snack to treat hypoglycemia Eat at least 3 meals a day with snack in between to help prevent hypoglycemia Please consider/participate with Home Health Nursing/Therapy involvement Fall precautions and preventions; Wear life alert at all times Contact Endocrinology for reinstating insulin pump Weigh self daily using walker to balance; request help when putting new batteries in scale Low salt carb modified diet  Follow Up Plan: The care management team will reach out to the patient again over the next 21 business days.        Plan:The care management team will reach out to the patient again over the next 30 days.  [name] RN, MSN RN Care Management Coordinator [place] [phone] [email or web address]

## 2021-11-07 NOTE — Patient Instructions (Signed)
Visit Information  Thank you for taking time to visit with me today. Please don't hesitate to contact me if I can be of assistance to you before our next scheduled telephone appointment.  Following are the goals we discussed today:  Check blood sugar at least 5 times a day Check blood sugar if I feel it is too high or too low Take the blood sugar meter and log to all doctor visits  Consider drinking Boost or other supplement in the evenings if not eating a complete meal Make sure to have candy or glucose tablets on you at all times; try drinking coke along with eating snack to treat hypoglycemia Eat at least 3 meals a day with snack in between to help prevent hypoglycemia Please consider/participate with Home Health Nursing/Therapy involvement Fall precautions and preventions; Wear life alert at all times Contact Endocrinology for reinstating insulin pump Weigh self daily using walker to balance; request help when putting new batteries in scale Low salt carb modified diet  Our next appointment is by telephone on 2/13 at 1315  Please call the care guide team at [phone] if you need to cancel or reschedule your appointment.   If you are experiencing a Mental Health or [place] or need someone to talk to, please call the Suicide and Crisis Lifeline: 988 call the [place] National Suicide Prevention Lifeline: (519)243-0369 or TTY: 718-240-6340 TTY (506)385-8885) to talk to a trained counselor call 1-800-273-TALK (toll free, 24 hour hotline) call 911   Patient verbalizes understanding of instructions and care plan provided today and agrees to view in [place]. Active MyChart status confirmed with patient.    [name] RN, MSN RN Care Management Coordinator [place] [phone] [email or web address]

## 2021-11-10 ENCOUNTER — Encounter (INDEPENDENT_AMBULATORY_CARE_PROVIDER_SITE_OTHER): Payer: Self-pay | Admitting: Nurse Practitioner

## 2021-11-11 DIAGNOSIS — M6281 Muscle weakness (generalized): Secondary | ICD-10-CM | POA: Diagnosis not present

## 2021-11-11 DIAGNOSIS — S2232XD Fracture of one rib, left side, subsequent encounter for fracture with routine healing: Secondary | ICD-10-CM | POA: Diagnosis not present

## 2021-11-11 DIAGNOSIS — E10649 Type 1 diabetes mellitus with hypoglycemia without coma: Secondary | ICD-10-CM | POA: Diagnosis not present

## 2021-11-11 DIAGNOSIS — I5022 Chronic systolic (congestive) heart failure: Secondary | ICD-10-CM

## 2021-11-11 DIAGNOSIS — Z9181 History of falling: Secondary | ICD-10-CM | POA: Diagnosis not present

## 2021-11-11 DIAGNOSIS — E1065 Type 1 diabetes mellitus with hyperglycemia: Secondary | ICD-10-CM | POA: Diagnosis not present

## 2021-11-11 DIAGNOSIS — E1151 Type 2 diabetes mellitus with diabetic peripheral angiopathy without gangrene: Secondary | ICD-10-CM

## 2021-11-11 DIAGNOSIS — I130 Hypertensive heart and chronic kidney disease with heart failure and stage 1 through stage 4 chronic kidney disease, or unspecified chronic kidney disease: Secondary | ICD-10-CM | POA: Diagnosis not present

## 2021-11-12 ENCOUNTER — Other Ambulatory Visit: Payer: Self-pay

## 2021-11-12 ENCOUNTER — Ambulatory Visit (INDEPENDENT_AMBULATORY_CARE_PROVIDER_SITE_OTHER): Payer: Medicare Other | Admitting: Nurse Practitioner

## 2021-11-12 VITALS — BP 131/75 | HR 87 | Resp 16 | Ht 70.0 in | Wt 222.0 lb

## 2021-11-12 DIAGNOSIS — I890 Lymphedema, not elsewhere classified: Secondary | ICD-10-CM | POA: Diagnosis not present

## 2021-11-12 NOTE — Progress Notes (Signed)
History of Present Illness  There is no documented history at this time  Assessments & Plan   There are no diagnoses linked to this encounter.    Additional instructions  Subjective:  Patient presents with venous ulcer of the Bilateral lower extremity.    Procedure:  3 layer unna wrap was placed Bilateral lower extremity.   Plan:   Follow up in one week.  

## 2021-11-16 ENCOUNTER — Encounter (INDEPENDENT_AMBULATORY_CARE_PROVIDER_SITE_OTHER): Payer: Self-pay | Admitting: Nurse Practitioner

## 2021-11-19 DIAGNOSIS — E10649 Type 1 diabetes mellitus with hypoglycemia without coma: Secondary | ICD-10-CM | POA: Diagnosis not present

## 2021-11-19 DIAGNOSIS — I130 Hypertensive heart and chronic kidney disease with heart failure and stage 1 through stage 4 chronic kidney disease, or unspecified chronic kidney disease: Secondary | ICD-10-CM | POA: Diagnosis not present

## 2021-11-19 DIAGNOSIS — M6281 Muscle weakness (generalized): Secondary | ICD-10-CM | POA: Diagnosis not present

## 2021-11-19 DIAGNOSIS — Z9181 History of falling: Secondary | ICD-10-CM | POA: Diagnosis not present

## 2021-11-19 DIAGNOSIS — E1065 Type 1 diabetes mellitus with hyperglycemia: Secondary | ICD-10-CM | POA: Diagnosis not present

## 2021-11-19 DIAGNOSIS — S2232XD Fracture of one rib, left side, subsequent encounter for fracture with routine healing: Secondary | ICD-10-CM | POA: Diagnosis not present

## 2021-11-20 ENCOUNTER — Other Ambulatory Visit: Payer: Self-pay

## 2021-11-20 ENCOUNTER — Other Ambulatory Visit (INDEPENDENT_AMBULATORY_CARE_PROVIDER_SITE_OTHER): Payer: Medicare Other

## 2021-11-20 DIAGNOSIS — E876 Hypokalemia: Secondary | ICD-10-CM | POA: Diagnosis not present

## 2021-11-21 ENCOUNTER — Other Ambulatory Visit: Payer: Self-pay

## 2021-11-21 DIAGNOSIS — E876 Hypokalemia: Secondary | ICD-10-CM

## 2021-11-21 LAB — POTASSIUM: Potassium: 3.2 mEq/L — ABNORMAL LOW (ref 3.5–5.1)

## 2021-11-21 MED ORDER — POTASSIUM CHLORIDE ER 10 MEQ PO TBCR: 10.0000 meq | EXTENDED_RELEASE_TABLET | Freq: Two times a day (BID) | ORAL | 0 refills | Status: DC

## 2021-11-24 ENCOUNTER — Encounter: Payer: Self-pay | Admitting: Podiatry

## 2021-11-24 ENCOUNTER — Ambulatory Visit (INDEPENDENT_AMBULATORY_CARE_PROVIDER_SITE_OTHER): Payer: Medicare Other | Admitting: *Deleted

## 2021-11-24 ENCOUNTER — Ambulatory Visit (INDEPENDENT_AMBULATORY_CARE_PROVIDER_SITE_OTHER): Payer: Medicare Other | Admitting: Podiatry

## 2021-11-24 ENCOUNTER — Other Ambulatory Visit: Payer: Self-pay

## 2021-11-24 DIAGNOSIS — Z9181 History of falling: Secondary | ICD-10-CM | POA: Diagnosis not present

## 2021-11-24 DIAGNOSIS — N179 Acute kidney failure, unspecified: Secondary | ICD-10-CM

## 2021-11-24 DIAGNOSIS — E1151 Type 2 diabetes mellitus with diabetic peripheral angiopathy without gangrene: Secondary | ICD-10-CM | POA: Diagnosis not present

## 2021-11-24 DIAGNOSIS — B351 Tinea unguium: Secondary | ICD-10-CM | POA: Diagnosis not present

## 2021-11-24 DIAGNOSIS — E10649 Type 1 diabetes mellitus with hypoglycemia without coma: Secondary | ICD-10-CM | POA: Diagnosis not present

## 2021-11-24 DIAGNOSIS — I5022 Chronic systolic (congestive) heart failure: Secondary | ICD-10-CM

## 2021-11-24 DIAGNOSIS — M79674 Pain in right toe(s): Secondary | ICD-10-CM | POA: Diagnosis not present

## 2021-11-24 DIAGNOSIS — S2232XD Fracture of one rib, left side, subsequent encounter for fracture with routine healing: Secondary | ICD-10-CM | POA: Diagnosis not present

## 2021-11-24 DIAGNOSIS — M79675 Pain in left toe(s): Secondary | ICD-10-CM | POA: Diagnosis not present

## 2021-11-24 DIAGNOSIS — I10 Essential (primary) hypertension: Secondary | ICD-10-CM

## 2021-11-24 DIAGNOSIS — M6281 Muscle weakness (generalized): Secondary | ICD-10-CM | POA: Diagnosis not present

## 2021-11-24 DIAGNOSIS — I130 Hypertensive heart and chronic kidney disease with heart failure and stage 1 through stage 4 chronic kidney disease, or unspecified chronic kidney disease: Secondary | ICD-10-CM | POA: Diagnosis not present

## 2021-11-24 DIAGNOSIS — E1065 Type 1 diabetes mellitus with hyperglycemia: Secondary | ICD-10-CM | POA: Diagnosis not present

## 2021-11-24 NOTE — Patient Instructions (Addendum)
Visit Information  Thank you for taking time to visit with me today. Please don't hesitate to contact me if I can be of assistance to you before our next scheduled telephone appointment.  Following are the goals we discussed today:  Check blood sugar at least 5 times a day Check blood sugar if I feel it is too high or too low Take the blood sugar meter and log to all doctor visits  Consider drinking Boost or other supplement in the evenings if not eating a complete meal Make sure to have candy or glucose tablets on you at all times; try drinking coke along with eating snack to treat hypoglycemia Eat at least 3 meals a day with snack in between to help prevent hypoglycemia Please consider/participate with Home Health Nursing/Therapy involvement Fall precautions and preventions; Wear life alert at all times Contact Endocrinology for reinstating insulin pump Weigh self daily using walker to balance; request help when putting new batteries in scale Low salt carb modified diet Schedule appointment with Endocrinology ASAP  Our next appointment is by telephone on 2/20 at 1430  Please call the care guide team at [phone] if you need to cancel or reschedule your appointment.   If you are experiencing a Mental Health or [place] or need someone to talk to, please call the Suicide and Crisis Lifeline: 988 call the [place] National Suicide Prevention Lifeline: (713)265-3823 or TTY: 510 033 9495 TTY 7430802206) to talk to a trained counselor call 1-800-273-TALK (toll free, 24 hour hotline) call 911   Patient verbalizes understanding of instructions and care plan provided today and agrees to view in [place]. Active MyChart status confirmed with patient.    [name] RN, MSN RN Care Management Coordinator [place] [phone] [email or web address]

## 2021-11-24 NOTE — Chronic Care Management (AMB) (Signed)
Chronic Care Management   CCM RN Visit Note  11/24/2021 Name: Gary [name] MRN: [id number] DOB: 10-19-1945  Subjective: Gary [name] is a 76 y.o. year old male who is a primary care patient of [name], MD. The care management team was consulted for assistance with disease management and care coordination needs.    Engaged with patient by telephone for follow up visit in response to provider referral for case management and/or care coordination services.   Consent to Services:  The patient was given information about Chronic Care Management services, agreed to services, and gave verbal consent prior to initiation of services.  Please see initial visit note for detailed documentation.   Patient agreed to services and verbal consent obtained.   Assessment: Review of patient past medical history, allergies, medications, health status, including review of consultants reports, laboratory and other test data, was performed as part of comprehensive evaluation and provision of chronic care management services.   SDOH (Social Determinants of Health) assessments and interventions performed:    CCM Care Plan  Allergies  Allergen Reactions   Rofecoxib Nausea Only    Outpatient Encounter Medications as of 11/24/2021  Medication Sig   acetaminophen (TYLENOL) 325 MG tablet Take 650 mg by mouth every 6 (six) hours as needed.   bacitracin 500 UNIT/GM ointment Apply 1 application topically daily.   carvedilol (COREG) 3.125 MG tablet Take 1 tablet (3.125 mg total) by mouth 2 (two) times daily with a meal. Hold if systolic <115 and HR <72 (Patient not taking: Reported on 10/17/2021)   Cholecalciferol (VITAMIN D3) 50 MCG (2000 UT) CAPS Take 2,000 Units by mouth daily.   feeding supplement, GLUCERNA SHAKE, (GLUCERNA SHAKE) LIQD Take 237 mLs by mouth 2 (two) times daily between meals.   ferrous sulfate 325 (65 FE) MG tablet Take 325 mg by mouth daily with breakfast.   FLUoxetine (PROZAC) 20 MG  capsule Take 1 capsule (20 mg total) by mouth daily.   furosemide (LASIX) 40 MG tablet Take 1 tablet (40 mg total) by mouth daily.   glucose 4 GM chewable tablet Chew 1 tablet (4 g total) by mouth once as needed for low blood sugar.   insulin aspart (NOVOLOG) 100 UNIT/ML injection Inject 0-9 Units into the skin 3 (three) times daily with meals. CBG 70 - 120: 0 units CBG 121 - 150: 1 unit CBG 151 - 200: 2 units CBG 201 - 250: 3 units CBG 251 - 300: 5 units CBG 301 - 350: 7 units CBG 351 - 400: 9 units   insulin aspart (NOVOLOG) 100 UNIT/ML injection Inject 0-5 Units into the skin at bedtime. CBG 70 - 120: 0 units CBG 121 - 150: 0 units CBG 151 - 200: 0 units CBG 201 - 250: 2 units CBG 251 - 300: 3 units CBG 301 - 350: 4 units CBG 351 - 400: 5 units   insulin aspart (NOVOLOG) 100 UNIT/ML injection Inject 6 Units into the skin 3 (three) times daily with meals.   insulin glargine-yfgn (SEMGLEE) 100 UNIT/ML injection Inject 0.25 mLs (25 Units total) into the skin daily. (Patient taking differently: Inject 16 Units into the skin daily.)   levothyroxine (SYNTHROID) 25 MCG tablet Take 25 mcg by mouth daily before breakfast.   lidocaine (LIDODERM) 5 % Place 1 patch onto the skin daily. Remove & Discard patch within 12 hours or as directed by MD   methocarbamol (ROBAXIN) 750 MG tablet Take 1 tablet (750 mg total) by mouth every 6 (  six) hours as needed for muscle spasms.   polyethylene glycol (MIRALAX / GLYCOLAX) 17 g packet Take 17 g by mouth 2 (two) times daily.   polyvinyl alcohol (LIQUIFILM TEARS) 1.4 % ophthalmic solution Place 1 drop into both eyes as needed for dry eyes.   potassium chloride (KLOR-CON) 10 MEQ tablet Take 1 tablet (10 mEq total) by mouth 2 (two) times daily.   pravastatin (PRAVACHOL) 40 MG tablet Take 40 mg by mouth daily.    vitamin B-12 (CYANOCOBALAMIN) 1000 MCG tablet Take 1,000 mcg by mouth daily.   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    Left rib fracture_7th rib    Leukocytosis    HTN (hypertension)    Hypothyroid    CAD (coronary artery disease)    Porokeratosis 05/15/2021   Aortic atherosclerosis ([place]) 05/05/2021   Emphysema lung ([place]) 05/05/2021   Thrombocytopenia ([place]) 04/29/2021   Splinter of foot without infection 02/23/2021   Arm laceration 02/09/2021   Hypokalemia 10/24/2020   Injury by nail 08/15/2020   Shingles 07/24/2020   Rash 07/02/2020   Colon cancer screening 07/02/2020   Sick sinus syndrome ([place]) 12/28/2019   Acquired absence of kidney 09/13/2019   Edema of lower extremity 09/13/2019   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, or unspecified 09/13/2019   Proteinuria 09/13/2019   Lower extremity pain, bilateral    Diabetic foot ulcer associated with type 1 diabetes mellitus ([place])    Acute on chronic systolic CHF (congestive heart failure) ([place])    Cellulitis 08/18/2019   Lower limb ulcer, calf ([place]) 08/01/2019   Left hip pain 07/16/2019   Pain due to onychomycosis of toenails of both feet 04/03/2019   Swelling of limb 02/07/2019   CKD (chronic kidney disease) stage 3, GFR 30-59 ml/min (HCC) 09/16/2018   Left shoulder pain 09/16/2018   Chronic cholecystitis 12/29/2017   Graves disease 12/29/2017   Peripheral neuropathy 12/29/2017   S/p nephrectomy 12/29/2017   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   Symptomatic bradycardia 12/02/2017   CKD (chronic kidney disease), stage IIIa 44/10/270   Chronic systolic CHF (congestive heart failure) ([place]) 12/02/2017   Saturday night paralysis 11/12/2017   Hypoglycemia 10/28/2017   Goals of care, counseling/discussion 10/24/2017   Lymphedema  10/20/2017   Iron deficiency anemia 09/11/2017   Fall 08/13/2017   Humerus fracture 08/13/2017   Anemia 01/12/2017   Bilateral carotid artery stenosis 06/29/2016   Hand laceration 12/16/2015   Adjustment disorder with depressed mood 08/11/2015   Cardiomyopathy, idiopathic ([place]) 04/22/2015   CAD in native artery 11/02/2014   Mantle cell lymphoma ([place]) 08/03/2014   History of colonic polyps 04/29/2014   Irregular heart beat 04/22/2014   SOB (shortness of breath) 04/22/2014   Stress 03/21/2014   PVC (premature ventricular contraction) 02/21/2014   GERD (gastroesophageal reflux disease) 10/23/2013   Gastroparesis 06/25/2013   Chest pain 04/13/2013   Thyroid disease 04/13/2013   Diabetes ([place]) 04/13/2013   Essential hypertension, benign 04/13/2013   Hypercholesterolemia 04/13/2013    Conditions to be addressed/monitored:CHF and DMII  Care Plan : RNCM  Diabetes  Updates made by Leona Singleton, RN since  11/24/2021 12:00 AM     Problem: Hypoglycemia causing syncopal episodes   Priority: Medium     Long-Range Goal: Patient will report maintaing Hgb A1C of 7 or below in the next 90 days   Start Date: 01/27/2021  Expected End Date: 02/08/2022  Recent Progress: Not on track  Priority: Medium  Note:   Objective:  Lab Results  Component Value Date   HGBA1C 7.0 01/23/2021  Current Barriers:  Knowledge Deficits related to basic Diabetes pathophysiology and self care/management as evidenced by multiple syncopal episodes related to hypoglycemia.  Limited Social Support Trouble getting insulin pump supplies-reports he now has insulin pump sensors  Latest Hgb A1C 6.9 on 08/12/21.  Just discharged from SNF few weeks from  recent hospitalization for fall and hypoglycemia.  States he turned his insulin pump back on when he returned home.  Has not poken with Endocrino;ogy.  Reports blood sugars have ranged 120's with a few hypoglycemic events in th 50's.  Fasting this morning 50.  Instructed  to contact Endocrnologist as soon as possible.  Does report the home health agency has made contact (unsure  of the name), he is just waiting for them to come out (1/5 or 1/6).  Denies any falls since discharge home.  1/11-Reports waking to hypoglycemic alarm of 60 this morning.  Has had few more hypoglycemic episode lowest of 50.  Did turn pump off this morning o treat hypoglycemia.  Denies falls since discharge.  Still not spoken with Endocrinology.  States he has tried  to leave message.  Offered  to assist with calling Endocrinology, declined assistance. Weight this morning 213 pounds, BP 120/60s.  Does report no longer taking Coreg due to hypotension while at rehab.  Continues to have home health; denies any chest pain, shortness of breath, or swellinle  1/25--Reports he is doing fair.  Has not been weighing at home due to needing batteries for home scale.  Still has not contacted Endocrinology about insulin pump; fasting blood sugar have ranged 90-120's with a few hypoglycemic event of 40-50's and highest of 400's.  BP 123/70.  Denies any recent falls since being home; continues to work with [facility] therapy  2/13--Spoke with patient for only a few minutes as home health therapy has just arrived.  Patient reports being tired but admits waking early to low blood sugar alarm in 50's this morning.  States it is back up to normal now,  Still has not spoken with Endocrinology and declines my offer of assistance to contact and schedule follow up.  Denies fall but adits to some stumbles.  Still needs to get someone to replace scale batteries  Case Manager Clinical Goal(s):  patient will demonstrate improved adherence to prescribed treatment plan for diabetes self care/management as evidenced by: at least 4 times a day monitoring and recording of CBG, adherence to ADA/ carb modified diet, adherence to prescribed medication regimen, contacting provider for new or worsened symptoms or questions  Diabetes  Interventions:  (Status: Goal on track: NO.)  Long Term Goal  Collaboration with [name], MD regarding development and update of comprehensive plan of care as evidenced by provider attestation and co-signature Inter-disciplinary care team collaboration (see longitudinal plan of care) Provided education to patient about basic DM disease process Discussed medications and encouraged medication compliance Discussed plans with patient for ongoing care management follow up and provided patient with direct contact information for care management team Encouraged continued participation with CCM Pharmacist  Reviewed glucose ranges and hypoglycemic  events; and discussed dangers of hypoglycemia and treatment options, need for immediate sugar replacement with juice, sugar, soda in addition to snack or candy bar or glucose tablets Discussed proper treatment of hypoglycemia Discussed healthy meal and snack options Encouraged to continue working with [facility] or Therapy referral for safety evaluation after 2 falls, educating and discussing even if they start services, patient can cancel at any time Discussed and encouraged patient to be proactive with health instead of reactive after falls Fall precautions and preventions reviewed and discussed, encouraged to use walker with all ambulation,  Instructed and encouraged to wear life alert at all times Discussed logging blood sugars and writing down insulin doses taken to help provider manage medications; sent 2022 Calendar Booklet to help with keeping log of blood sugars and insulin dose Reviewed and discussed proper diabetic foot care, encouraged patient to keep check on right foot splinter wound for signs and symptoms of infection Discussed and encouraged and reinforced patient to wear bedroom/house/slides while in the home and not to walk around bare foot Discussed with patient asking podiatrist for prescription for diabetic shoes Instructed &  Encouraged to contact Endocrinology ASAP since turning pump back on for possible insulin adjustments;  offered assistants to contact endocrinology (AGAIN declines at this time)  Heart Failure Interventions:  (Status: Goal on track: NO.)  Long Term Goal  Basic overview and discussion of pathophysiology of Heart Failure reviewed Provided education on low sodium diet Reviewed Heart Failure Action Plan in depth and provided written copy Discussed importance of daily weight and advised patient to weigh and record daily Reviewed role of diuretics in prevention of fluid overload and management of heart failure Discussed the importance of keeping all appointments with provider Provided patient with education about the role of exercise in the management of heart failure  Encouraged to request help when putting new batteries in scale Fall precautions and preventions  Patient Goals/Self-Care Activities Check blood sugar at least 5 times a day Check blood sugar if I feel it is too high or too low Take the blood sugar meter and log to all doctor visits  Consider drinking Boost or other supplement in the evenings if not eating a complete meal Make sure to have candy or glucose tablets on you at all times; try drinking coke along with eating snack to treat hypoglycemia Eat at least 3 meals a day with snack in between to help prevent hypoglycemia Please consider/participate with Home Health Nursing/Therapy involvement Fall precautions and preventions; Wear life alert at all times Contact Endocrinology for reinstating insulin pump Weigh self daily using walker to balance; request help when putting new batteries in scale Low salt carb modified diet Schedule appointment with Endocrinology ASAP  Follow Up Plan: The care management team will reach out to the patient again over the next 21 business days.        Plan:The care management team will reach out to the patient again over the next 21  days.  [name] RN, MSN RN Care Management Coordinator [place] [phone] [email or web address]

## 2021-11-24 NOTE — Progress Notes (Signed)
This patient returns to my office for at risk foot care.  This patient requires this care by a professional since this patient will be at risk due to having chronic kidney disease and diabetes.  This patient is unable to cut nails himself since the patient cannot reach his nails.These nails are painful walking and wearing shoes. This patient is wearing unna boots on both legs/feet. This patient presents for at risk foot care today.  General Appearance  Alert, conversant and in no acute stress.  Vascular  Deferred due to unna boots.  Neurologic  Deferred due to unna boots.  Nails Thick disfigured discolored nails with subungual debris  from hallux to fifth toes bilaterally. No evidence of bacterial infection or drainage bilaterally.   Orthopedic  No limitations of motion  feet .  No crepitus or effusions noted.  No bony pathology or digital deformities noted.  Skin  normotropic skin with no porokeratosis noted bilaterally.  No signs of infections or ulcers noted.     Onychomycosis  Pain in right toes  Pain in left toes  Consent was obtained for treatment procedures.   Mechanical debridement of nails 1-5  bilaterally performed with a nail nipper.  Filed with dremel without incident.    Return office visit    3 months                 Told patient to return for periodic foot care and evaluation due to potential at risk complications.   [name] DPM

## 2021-11-25 ENCOUNTER — Inpatient Hospital Stay: Payer: Medicare Other | Attending: Oncology

## 2021-11-25 ENCOUNTER — Telehealth: Payer: Self-pay | Admitting: *Deleted

## 2021-11-25 DIAGNOSIS — Z9181 History of falling: Secondary | ICD-10-CM | POA: Diagnosis not present

## 2021-11-25 DIAGNOSIS — I130 Hypertensive heart and chronic kidney disease with heart failure and stage 1 through stage 4 chronic kidney disease, or unspecified chronic kidney disease: Secondary | ICD-10-CM | POA: Diagnosis not present

## 2021-11-25 DIAGNOSIS — M6281 Muscle weakness (generalized): Secondary | ICD-10-CM | POA: Diagnosis not present

## 2021-11-25 DIAGNOSIS — E10649 Type 1 diabetes mellitus with hypoglycemia without coma: Secondary | ICD-10-CM | POA: Diagnosis not present

## 2021-11-25 DIAGNOSIS — E1065 Type 1 diabetes mellitus with hyperglycemia: Secondary | ICD-10-CM | POA: Diagnosis not present

## 2021-11-25 DIAGNOSIS — S2232XD Fracture of one rib, left side, subsequent encounter for fracture with routine healing: Secondary | ICD-10-CM | POA: Diagnosis not present

## 2021-11-25 NOTE — Telephone Encounter (Signed)
Patient missed his port flush appointment he reports that his ride did not show up. He uses our transportation.

## 2021-11-26 ENCOUNTER — Encounter: Payer: Self-pay | Admitting: Oncology

## 2021-11-26 ENCOUNTER — Other Ambulatory Visit: Payer: Self-pay

## 2021-11-26 ENCOUNTER — Encounter (INDEPENDENT_AMBULATORY_CARE_PROVIDER_SITE_OTHER): Payer: Self-pay | Admitting: Nurse Practitioner

## 2021-11-26 ENCOUNTER — Ambulatory Visit (INDEPENDENT_AMBULATORY_CARE_PROVIDER_SITE_OTHER): Payer: Medicare Other | Admitting: Nurse Practitioner

## 2021-11-26 VITALS — BP 161/70 | HR 70 | Resp 16 | Wt 226.4 lb

## 2021-11-26 DIAGNOSIS — I130 Hypertensive heart and chronic kidney disease with heart failure and stage 1 through stage 4 chronic kidney disease, or unspecified chronic kidney disease: Secondary | ICD-10-CM | POA: Diagnosis not present

## 2021-11-26 DIAGNOSIS — M6281 Muscle weakness (generalized): Secondary | ICD-10-CM | POA: Diagnosis not present

## 2021-11-26 DIAGNOSIS — E1065 Type 1 diabetes mellitus with hyperglycemia: Secondary | ICD-10-CM | POA: Diagnosis not present

## 2021-11-26 DIAGNOSIS — S2232XD Fracture of one rib, left side, subsequent encounter for fracture with routine healing: Secondary | ICD-10-CM | POA: Diagnosis not present

## 2021-11-26 DIAGNOSIS — I890 Lymphedema, not elsewhere classified: Secondary | ICD-10-CM

## 2021-11-26 DIAGNOSIS — E10649 Type 1 diabetes mellitus with hypoglycemia without coma: Secondary | ICD-10-CM | POA: Diagnosis not present

## 2021-11-26 DIAGNOSIS — Z9181 History of falling: Secondary | ICD-10-CM | POA: Diagnosis not present

## 2021-11-26 NOTE — Progress Notes (Signed)
History of Present Illness  There is no documented history at this time  Assessments & Plan   There are no diagnoses linked to this encounter.    Additional instructions  Subjective:  Patient presents with venous ulcer of the Bilateral lower extremity.    Procedure:  3 layer unna wrap was placed Bilateral lower extremity.   Plan:   Follow up in one week.  

## 2021-11-29 DIAGNOSIS — E1042 Type 1 diabetes mellitus with diabetic polyneuropathy: Secondary | ICD-10-CM | POA: Diagnosis not present

## 2021-11-29 DIAGNOSIS — I2510 Atherosclerotic heart disease of native coronary artery without angina pectoris: Secondary | ICD-10-CM | POA: Diagnosis not present

## 2021-11-29 DIAGNOSIS — E1043 Type 1 diabetes mellitus with diabetic autonomic (poly)neuropathy: Secondary | ICD-10-CM | POA: Diagnosis not present

## 2021-11-29 DIAGNOSIS — H04123 Dry eye syndrome of bilateral lacrimal glands: Secondary | ICD-10-CM | POA: Diagnosis not present

## 2021-11-29 DIAGNOSIS — N1831 Chronic kidney disease, stage 3a: Secondary | ICD-10-CM | POA: Diagnosis not present

## 2021-11-29 DIAGNOSIS — I130 Hypertensive heart and chronic kidney disease with heart failure and stage 1 through stage 4 chronic kidney disease, or unspecified chronic kidney disease: Secondary | ICD-10-CM | POA: Diagnosis not present

## 2021-11-29 DIAGNOSIS — E039 Hypothyroidism, unspecified: Secondary | ICD-10-CM | POA: Diagnosis not present

## 2021-11-29 DIAGNOSIS — E785 Hyperlipidemia, unspecified: Secondary | ICD-10-CM | POA: Diagnosis not present

## 2021-11-29 DIAGNOSIS — I890 Lymphedema, not elsewhere classified: Secondary | ICD-10-CM | POA: Diagnosis not present

## 2021-11-29 DIAGNOSIS — J439 Emphysema, unspecified: Secondary | ICD-10-CM | POA: Diagnosis not present

## 2021-11-29 DIAGNOSIS — Z9181 History of falling: Secondary | ICD-10-CM | POA: Diagnosis not present

## 2021-11-29 DIAGNOSIS — K5900 Constipation, unspecified: Secondary | ICD-10-CM | POA: Diagnosis not present

## 2021-11-29 DIAGNOSIS — I872 Venous insufficiency (chronic) (peripheral): Secondary | ICD-10-CM | POA: Diagnosis not present

## 2021-11-29 DIAGNOSIS — K3184 Gastroparesis: Secondary | ICD-10-CM | POA: Diagnosis not present

## 2021-11-29 DIAGNOSIS — Z950 Presence of cardiac pacemaker: Secondary | ICD-10-CM | POA: Diagnosis not present

## 2021-11-29 DIAGNOSIS — E1022 Type 1 diabetes mellitus with diabetic chronic kidney disease: Secondary | ICD-10-CM | POA: Diagnosis not present

## 2021-11-29 DIAGNOSIS — E103519 Type 1 diabetes mellitus with proliferative diabetic retinopathy with macular edema, unspecified eye: Secondary | ICD-10-CM | POA: Diagnosis not present

## 2021-11-29 DIAGNOSIS — E1051 Type 1 diabetes mellitus with diabetic peripheral angiopathy without gangrene: Secondary | ICD-10-CM | POA: Diagnosis not present

## 2021-11-29 DIAGNOSIS — F32A Depression, unspecified: Secondary | ICD-10-CM | POA: Diagnosis not present

## 2021-11-29 DIAGNOSIS — Z8572 Personal history of non-Hodgkin lymphomas: Secondary | ICD-10-CM | POA: Diagnosis not present

## 2021-11-29 DIAGNOSIS — Z95828 Presence of other vascular implants and grafts: Secondary | ICD-10-CM | POA: Diagnosis not present

## 2021-11-29 DIAGNOSIS — I5022 Chronic systolic (congestive) heart failure: Secondary | ICD-10-CM | POA: Diagnosis not present

## 2021-11-29 DIAGNOSIS — D509 Iron deficiency anemia, unspecified: Secondary | ICD-10-CM | POA: Diagnosis not present

## 2021-11-30 ENCOUNTER — Encounter (INDEPENDENT_AMBULATORY_CARE_PROVIDER_SITE_OTHER): Payer: Self-pay | Admitting: Nurse Practitioner

## 2021-12-01 ENCOUNTER — Other Ambulatory Visit: Payer: Self-pay

## 2021-12-01 ENCOUNTER — Telehealth: Payer: Self-pay | Admitting: Internal Medicine

## 2021-12-01 ENCOUNTER — Other Ambulatory Visit (INDEPENDENT_AMBULATORY_CARE_PROVIDER_SITE_OTHER): Payer: Medicare Other

## 2021-12-01 ENCOUNTER — Telehealth: Payer: Self-pay | Admitting: Oncology

## 2021-12-01 ENCOUNTER — Telehealth (INDEPENDENT_AMBULATORY_CARE_PROVIDER_SITE_OTHER): Payer: Self-pay

## 2021-12-01 DIAGNOSIS — E1051 Type 1 diabetes mellitus with diabetic peripheral angiopathy without gangrene: Secondary | ICD-10-CM | POA: Diagnosis not present

## 2021-12-01 DIAGNOSIS — I890 Lymphedema, not elsewhere classified: Secondary | ICD-10-CM | POA: Diagnosis not present

## 2021-12-01 DIAGNOSIS — E876 Hypokalemia: Secondary | ICD-10-CM | POA: Diagnosis not present

## 2021-12-01 DIAGNOSIS — N1831 Chronic kidney disease, stage 3a: Secondary | ICD-10-CM | POA: Diagnosis not present

## 2021-12-01 DIAGNOSIS — I130 Hypertensive heart and chronic kidney disease with heart failure and stage 1 through stage 4 chronic kidney disease, or unspecified chronic kidney disease: Secondary | ICD-10-CM | POA: Diagnosis not present

## 2021-12-01 DIAGNOSIS — E1022 Type 1 diabetes mellitus with diabetic chronic kidney disease: Secondary | ICD-10-CM | POA: Diagnosis not present

## 2021-12-01 DIAGNOSIS — I5022 Chronic systolic (congestive) heart failure: Secondary | ICD-10-CM | POA: Diagnosis not present

## 2021-12-01 LAB — POTASSIUM: Potassium: 3.6 mEq/L (ref 3.5–5.1)

## 2021-12-01 NOTE — Telephone Encounter (Signed)
Port flush request sent to [facility] as requested.

## 2021-12-01 NOTE — Telephone Encounter (Signed)
Crystal from [facility] calling to check on the status of two orders they faxed over for this Patient. States that she also called to check in the status of these orders 11/26/21 but has still not gotten a call back.   Wanting to confirm that the two orders on this patient have been received in office. Please give her a call back at [phone].

## 2021-12-01 NOTE — Telephone Encounter (Signed)
That is fine 

## 2021-12-01 NOTE — Telephone Encounter (Signed)
Called the patient to reschedule his missed port flush last week. Pt had a nurse from [facility] present while we spoke. After ending the conversation with Gary [name] his nurse informed me that they could do the port flushes on the patient if we sent them a order so he doesn't need to come here for that due to his mobility. Tiffany\Niki could you fax a order for the port flushes to [facility] please.

## 2021-12-01 NOTE — Telephone Encounter (Signed)
Gary [name] called an left a VM on the nurse line wanting to know would we like them to start doing the pts wraps due to him having trouble booking transportation. Please advise.

## 2021-12-02 NOTE — Telephone Encounter (Signed)
Can you please write and order for the pt to have his leg wrapped by home care .

## 2021-12-02 NOTE — Telephone Encounter (Signed)
Placed on your desk. 

## 2021-12-03 ENCOUNTER — Encounter (INDEPENDENT_AMBULATORY_CARE_PROVIDER_SITE_OTHER): Payer: Self-pay | Admitting: Nurse Practitioner

## 2021-12-03 ENCOUNTER — Ambulatory Visit (INDEPENDENT_AMBULATORY_CARE_PROVIDER_SITE_OTHER): Payer: Medicare Other | Admitting: Nurse Practitioner

## 2021-12-03 ENCOUNTER — Other Ambulatory Visit: Payer: Self-pay

## 2021-12-03 VITALS — BP 134/77 | HR 91 | Resp 16 | Wt 228.8 lb

## 2021-12-03 DIAGNOSIS — I890 Lymphedema, not elsewhere classified: Secondary | ICD-10-CM | POA: Diagnosis not present

## 2021-12-03 NOTE — Telephone Encounter (Signed)
I have faxed over the order for wraps to the pt's home health.

## 2021-12-03 NOTE — Progress Notes (Signed)
History of Present Illness  There is no documented history at this time  Assessments & Plan   There are no diagnoses linked to this encounter.    Additional instructions  Subjective:  Patient presents with venous ulcer of the Bilateral lower extremity.    Procedure:  3 layer unna wrap was placed Bilateral lower extremity.   Plan:   Follow up in one week.  

## 2021-12-03 NOTE — Telephone Encounter (Signed)
Spoke with Crystal and received order numbers. Do not have orders. She is refaxing.

## 2021-12-04 NOTE — Telephone Encounter (Signed)
Crystal wants a call back for orders that was faxed. She wanted the order number to verify that they have been received [phone]

## 2021-12-04 NOTE — Telephone Encounter (Signed)
Faxed. [place] aware.

## 2021-12-07 ENCOUNTER — Encounter (INDEPENDENT_AMBULATORY_CARE_PROVIDER_SITE_OTHER): Payer: Self-pay | Admitting: Nurse Practitioner

## 2021-12-08 ENCOUNTER — Ambulatory Visit: Payer: Medicare Other | Admitting: *Deleted

## 2021-12-08 DIAGNOSIS — I5022 Chronic systolic (congestive) heart failure: Secondary | ICD-10-CM

## 2021-12-08 DIAGNOSIS — I10 Essential (primary) hypertension: Secondary | ICD-10-CM

## 2021-12-08 DIAGNOSIS — E1151 Type 2 diabetes mellitus with diabetic peripheral angiopathy without gangrene: Secondary | ICD-10-CM

## 2021-12-08 NOTE — Patient Instructions (Addendum)
Visit Information  Thank you for taking time to visit with me today. Please don't hesitate to contact me if I can be of assistance to you before our next scheduled telephone appointment.  Following are the goals we discussed today:  Check blood sugar at least 5 times a day Check blood sugar if I feel it is too high or too low Take the blood sugar meter and log to all doctor visits  Consider drinking Boost or other supplement in the evenings if not eating a complete meal Make sure to have candy or glucose tablets on you at all times; try drinking coke along with eating snack to treat hypoglycemia Eat at least 3 meals a day with snack in between to help prevent hypoglycemia Please consider/participate with Home Health Nursing/Therapy involvement Fall precautions and preventions; Wear life alert at all times Contact Endocrinology for reinstating insulin pump Weigh self daily using walker to balance; request help when putting new batteries in scale Low salt carb modified diet Schedule appointment with Endocrinology ASAP  Our next appointment is by telephone on 3/22 at 1030  Please call the care guide team at [phone] if you need to cancel or reschedule your appointment.   If you are experiencing a Mental Health or [place] or need someone to talk to, please call the Suicide and Crisis Lifeline: 988 call the [place] National Suicide Prevention Lifeline: (312)744-0535 or TTY: 531-836-2139 TTY (207) 082-8339) to talk to a trained counselor call 1-800-273-TALK (toll free, 24 hour hotline) call 911   Patient verbalizes understanding of instructions and care plan provided today and agrees to view in [place]. Active MyChart status confirmed with patient.    [name] RN, MSN RN Care Management Coordinator [place] [phone] [email or web address]

## 2021-12-08 NOTE — Chronic Care Management (AMB) (Signed)
Chronic Care Management   CCM RN Visit Note  12/08/2021 Name: Gary [name] MRN: [id number] DOB: 03-Oct-1946  Subjective: Gary [name] is a 76 y.o. year old male who is a primary care patient of [name], MD. The care management team was consulted for assistance with disease management and care coordination needs.    Engaged with patient by telephone for follow up visit in response to provider referral for case management and/or care coordination services.   Consent to Services:  The patient was given information about Chronic Care Management services, agreed to services, and gave verbal consent prior to initiation of services.  Please see initial visit note for detailed documentation.   Patient agreed to services and verbal consent obtained.   Assessment: Review of patient past medical history, allergies, medications, health status, including review of consultants reports, laboratory and other test data, was performed as part of comprehensive evaluation and provision of chronic care management services.   SDOH (Social Determinants of Health) assessments and interventions performed:    CCM Care Plan  Allergies  Allergen Reactions   Rofecoxib Nausea Only    Outpatient Encounter Medications as of 12/08/2021  Medication Sig   acetaminophen (TYLENOL) 325 MG tablet Take 650 mg by mouth every 6 (six) hours as needed.   bacitracin 500 UNIT/GM ointment Apply 1 application topically daily.   carvedilol (COREG) 3.125 MG tablet Take 1 tablet (3.125 mg total) by mouth 2 (two) times daily with a meal. Hold if systolic <409 and HR <81 (Patient not taking: Reported on 10/17/2021)   Cholecalciferol (VITAMIN D3) 50 MCG (2000 UT) CAPS Take 2,000 Units by mouth daily.   feeding supplement, GLUCERNA SHAKE, (GLUCERNA SHAKE) LIQD Take 237 mLs by mouth 2 (two) times daily between meals.   ferrous sulfate 325 (65 FE) MG tablet Take 325 mg by mouth daily with breakfast.   FLUoxetine (PROZAC) 20 MG  capsule Take 1 capsule (20 mg total) by mouth daily.   furosemide (LASIX) 40 MG tablet Take 1 tablet (40 mg total) by mouth daily.   glucose 4 GM chewable tablet Chew 1 tablet (4 g total) by mouth once as needed for low blood sugar.   insulin aspart (NOVOLOG) 100 UNIT/ML injection Inject 0-9 Units into the skin 3 (three) times daily with meals. CBG 70 - 120: 0 units CBG 121 - 150: 1 unit CBG 151 - 200: 2 units CBG 201 - 250: 3 units CBG 251 - 300: 5 units CBG 301 - 350: 7 units CBG 351 - 400: 9 units   insulin aspart (NOVOLOG) 100 UNIT/ML injection Inject 0-5 Units into the skin at bedtime. CBG 70 - 120: 0 units CBG 121 - 150: 0 units CBG 151 - 200: 0 units CBG 201 - 250: 2 units CBG 251 - 300: 3 units CBG 301 - 350: 4 units CBG 351 - 400: 5 units   insulin aspart (NOVOLOG) 100 UNIT/ML injection Inject 6 Units into the skin 3 (three) times daily with meals.   insulin glargine-yfgn (SEMGLEE) 100 UNIT/ML injection Inject 0.25 mLs (25 Units total) into the skin daily. (Patient taking differently: Inject 16 Units into the skin daily.)   levothyroxine (SYNTHROID) 25 MCG tablet Take 25 mcg by mouth daily before breakfast.   lidocaine (LIDODERM) 5 % Place 1 patch onto the skin daily. Remove & Discard patch within 12 hours or as directed by MD   methocarbamol (ROBAXIN) 750 MG tablet Take 1 tablet (750 mg total) by mouth every 6 (  six) hours as needed for muscle spasms.   polyethylene glycol (MIRALAX / GLYCOLAX) 17 g packet Take 17 g by mouth 2 (two) times daily.   polyvinyl alcohol (LIQUIFILM TEARS) 1.4 % ophthalmic solution Place 1 drop into both eyes as needed for dry eyes.   potassium chloride (KLOR-CON) 10 MEQ tablet Take 1 tablet (10 mEq total) by mouth 2 (two) times daily.   pravastatin (PRAVACHOL) 40 MG tablet Take 40 mg by mouth daily.    vitamin B-12 (CYANOCOBALAMIN) 1000 MCG tablet Take 1,000 mcg by mouth daily.   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    Left rib fracture_7th rib    Leukocytosis    HTN (hypertension)    Hypothyroid    CAD (coronary artery disease)    Porokeratosis 05/15/2021   Aortic atherosclerosis ([place]) 05/05/2021   Emphysema lung ([place]) 05/05/2021   Thrombocytopenia ([place]) 04/29/2021   Splinter of foot without infection 02/23/2021   Arm laceration 02/09/2021   Hypokalemia 10/24/2020   Injury by nail 08/15/2020   Shingles 07/24/2020   Rash 07/02/2020   Colon cancer screening 07/02/2020   Sick sinus syndrome ([place]) 12/28/2019   Acquired absence of kidney 09/13/2019   Edema of lower extremity 09/13/2019   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, or unspecified 09/13/2019   Proteinuria 09/13/2019   Lower extremity pain, bilateral    Diabetic foot ulcer associated with type 1 diabetes mellitus ([place])    Acute on chronic systolic CHF (congestive heart failure) ([place])    Cellulitis 08/18/2019   Lower limb ulcer, calf ([place]) 08/01/2019   Left hip pain 07/16/2019   Pain due to onychomycosis of toenails of both feet 04/03/2019   Swelling of limb 02/07/2019   CKD (chronic kidney disease) stage 3, GFR 30-59 ml/min (HCC) 09/16/2018   Left shoulder pain 09/16/2018   Chronic cholecystitis 12/29/2017   Graves disease 12/29/2017   Peripheral neuropathy 12/29/2017   S/p nephrectomy 12/29/2017   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   Symptomatic bradycardia 12/02/2017   CKD (chronic kidney disease), stage IIIa 34/19/6222   Chronic systolic CHF (congestive heart failure) ([place]) 12/02/2017   Saturday night paralysis 11/12/2017   Hypoglycemia 10/28/2017   Goals of care, counseling/discussion 10/24/2017   Lymphedema  10/20/2017   Iron deficiency anemia 09/11/2017   Fall 08/13/2017   Humerus fracture 08/13/2017   Anemia 01/12/2017   Bilateral carotid artery stenosis 06/29/2016   Hand laceration 12/16/2015   Adjustment disorder with depressed mood 08/11/2015   Cardiomyopathy, idiopathic ([place]) 04/22/2015   CAD in native artery 11/02/2014   Mantle cell lymphoma ([place]) 08/03/2014   History of colonic polyps 04/29/2014   Irregular heart beat 04/22/2014   SOB (shortness of breath) 04/22/2014   Stress 03/21/2014   PVC (premature ventricular contraction) 02/21/2014   GERD (gastroesophageal reflux disease) 10/23/2013   Gastroparesis 06/25/2013   Chest pain 04/13/2013   Thyroid disease 04/13/2013   Diabetes ([place]) 04/13/2013   Essential hypertension, benign 04/13/2013   Hypercholesterolemia 04/13/2013    Conditions to be addressed/monitored:CHF, HTN, HLD, and DMII  Care Plan : RNCM  Diabetes  Updates made by Leona Singleton,  RN since 12/08/2021 12:00 AM     Problem: Hypoglycemia causing syncopal episodes   Priority: Medium     Long-Range Goal: Patient will report maintaing Hgb A1C of 7 or below in the next 90 days   Start Date: 01/27/2021  Expected End Date: 02/08/2022  Recent Progress: Not on track  Priority: Medium  Note:   Objective:  Lab Results  Component Value Date   HGBA1C 7.0 01/23/2021  Current Barriers:  Knowledge Deficits related to basic Diabetes pathophysiology and self care/management as evidenced by multiple syncopal episodes related to hypoglycemia.  Limited Social Support   Trouble getting insulin pump supplies-reports he now has insulin pump sensors  Latest Hgb A1C 6.9 on 08/12/21.  Just discharged from SNF few weeks from  recent hospitalization for fall and hypoglycemia.  States he turned his insulin pump back on when he returned home.  Has not poken with Endocrino;ogy.  Reports blood sugars have ranged 120's with a few hypoglycemic events in th 50's.  Fasting this morning 50.   Instructed to contact Endocrnologist as soon as possible.  Does report the home health agency has made contact (unsure  of the name), he is just waiting for them to come out (1/5 or 1/6).  Denies any falls since discharge home.  1/11-Reports waking to hypoglycemic alarm of 60 this morning.  Has had few more hypoglycemic episode lowest of 50.  Did turn pump off this morning o treat hypoglycemia.  Denies falls since discharge.  Still not spoken with Endocrinology.  States he has tried  to leave message.  Offered  to assist with calling Endocrinology, declined assistance. Weight this morning 213 pounds, BP 120/60s.  Does report no longer taking Coreg due to hypotension while at rehab.  Continues to have home health; denies any chest pain, shortness of breath, or swellinle  1/25--Reports he is doing fair.  Has not been weighing at home due to needing batteries for home scale.  Still has not contacted Endocrinology about insulin pump; fasting blood sugar have ranged 90-120's with a few hypoglycemic event of 40-50's and highest of 400's.  BP 123/70.  Denies any recent falls since being home; continues to work with [facility] therapy  2/13--Spoke with patient for only a few minutes as home health therapy has just arrived.  Patient reports being tired but admits waking early to low blood sugar alarm in 50's this morning.  States it is back up to normal now,  Still has not spoken with Endocrinology and declines my offer of assistance to contact and schedule follow up.  Denies fall but adits to some stumbles.  Still needs to get someone to replace scale batteries  2/27--States he feels pretty good.  Fasting blood sugar this morning was 86, lowest recently was 60's and average of 150's.  Has been using/eating donuts  to treat hypoglycemia.Marland Kitchen  RNCM still encouraging patient to contact Endocrinology.  Has new scale but not weighed today.  BP 135/85  Case Manager Clinical Goal(s):  patient will demonstrate improved adherence  to prescribed treatment plan for diabetes self care/management as evidenced by: at least 4 times a day monitoring and recording of CBG, adherence to ADA/ carb modified diet, adherence to prescribed medication regimen, contacting provider for new or worsened symptoms or questions  Diabetes Interventions:  (Status: Goal on track: NO.)  Long Term Goal  Collaboration with [name], MD regarding development and update of comprehensive plan of care as evidenced by provider attestation and co-signature Inter-disciplinary care team  collaboration (see longitudinal plan of care) Provided education to patient about basic DM disease process Discussed medications and encouraged medication compliance Discussed plans with patient for ongoing care management follow up and provided patient with direct contact information for care management team Encouraged continued participation with CCM Pharmacist  Reviewed glucose ranges and hypoglycemic events; and discussed dangers of hypoglycemia and treatment options, need for immediate sugar replacement with juice, sugar, soda in addition to snack or candy bar or glucose tablets Discussed proper treatment of hypoglycemia Discussed healthy meal and snack options Encouraged to continue working with [facility] or Therapy referral for safety evaluation after 2 falls, educating and discussing even if they start services, patient can cancel at any time Discussed and encouraged patient to be proactive with health instead of reactive after falls Fall precautions and preventions reviewed and discussed, encouraged to use walker with all ambulation,  Instructed and encouraged to wear life alert at all times Discussed logging blood sugars and writing down insulin doses taken to help provider manage medications; sent 2022 Calendar Booklet to help with keeping log of blood sugars and insulin dose Reviewed and discussed proper diabetic foot care, encouraged patient to keep  check on right foot splinter wound for signs and symptoms of infection Discussed and encouraged and reinforced patient to wear bedroom/house/slides while in the home and not to walk around bare foot Discussed with patient asking podiatrist for prescription for diabetic shoes Instructed & Encouraged to contact Endocrinology ASAP since turning pump back on for possible insulin adjustments;  offered assistants to contact endocrinology (AGAIN declines at this time)  Heart Failure Interventions:  (Status: Goal on track: NO.)  Long Term Goal  Basic overview and discussion of pathophysiology of Heart Failure reviewed Provided education on low sodium diet Reviewed Heart Failure Action Plan in depth and provided written copy Discussed importance of daily weight and advised patient to weigh and record daily Reviewed role of diuretics in prevention of fluid overload and management of heart failure Discussed the importance of keeping all appointments with provider Provided patient with education about the role of exercise in the management of heart failure  Encouraged to weigh self daily and discussed when to weigh Fall precautions and preventions  Patient Goals/Self-Care Activities Check blood sugar at least 5 times a day Check blood sugar if I feel it is too high or too low Take the blood sugar meter and log to all doctor visits  Consider drinking Boost or other supplement in the evenings if not eating a complete meal Make sure to have candy or glucose tablets on you at all times; try drinking coke along with eating snack to treat hypoglycemia Eat at least 3 meals a day with snack in between to help prevent hypoglycemia Please consider/participate with Home Health Nursing/Therapy involvement Fall precautions and preventions; Wear life alert at all times Contact Endocrinology for reinstating insulin pump Weigh self daily using walker to balance; request help when putting new batteries in scale Low  salt carb modified diet Schedule appointment with Endocrinology ASAP  Follow Up Plan: The care management team will reach out to the patient again over the next 21 business days.        Plan:The care management team will reach out to the patient again over the next 21 days.  [name] RN, MSN RN Care Management Coordinator [place] [phone] [email or web address]

## 2021-12-09 DIAGNOSIS — E1051 Type 1 diabetes mellitus with diabetic peripheral angiopathy without gangrene: Secondary | ICD-10-CM | POA: Diagnosis not present

## 2021-12-09 DIAGNOSIS — E1159 Type 2 diabetes mellitus with other circulatory complications: Secondary | ICD-10-CM

## 2021-12-09 DIAGNOSIS — Z794 Long term (current) use of insulin: Secondary | ICD-10-CM | POA: Diagnosis not present

## 2021-12-09 DIAGNOSIS — I509 Heart failure, unspecified: Secondary | ICD-10-CM

## 2021-12-09 DIAGNOSIS — N1831 Chronic kidney disease, stage 3a: Secondary | ICD-10-CM | POA: Diagnosis not present

## 2021-12-09 DIAGNOSIS — I5022 Chronic systolic (congestive) heart failure: Secondary | ICD-10-CM | POA: Diagnosis not present

## 2021-12-09 DIAGNOSIS — I890 Lymphedema, not elsewhere classified: Secondary | ICD-10-CM | POA: Diagnosis not present

## 2021-12-09 DIAGNOSIS — I130 Hypertensive heart and chronic kidney disease with heart failure and stage 1 through stage 4 chronic kidney disease, or unspecified chronic kidney disease: Secondary | ICD-10-CM | POA: Diagnosis not present

## 2021-12-09 DIAGNOSIS — E1022 Type 1 diabetes mellitus with diabetic chronic kidney disease: Secondary | ICD-10-CM | POA: Diagnosis not present

## 2021-12-10 ENCOUNTER — Other Ambulatory Visit: Payer: Self-pay

## 2021-12-10 ENCOUNTER — Ambulatory Visit (INDEPENDENT_AMBULATORY_CARE_PROVIDER_SITE_OTHER): Payer: Medicare Other | Admitting: Nurse Practitioner

## 2021-12-10 ENCOUNTER — Telehealth (INDEPENDENT_AMBULATORY_CARE_PROVIDER_SITE_OTHER): Payer: Self-pay

## 2021-12-10 ENCOUNTER — Encounter (INDEPENDENT_AMBULATORY_CARE_PROVIDER_SITE_OTHER): Payer: Self-pay | Admitting: Nurse Practitioner

## 2021-12-10 VITALS — BP 120/69 | HR 77 | Resp 15 | Wt 228.8 lb

## 2021-12-10 DIAGNOSIS — I890 Lymphedema, not elsewhere classified: Secondary | ICD-10-CM

## 2021-12-10 NOTE — Progress Notes (Signed)
History of Present Illness  There is no documented history at this time  Assessments & Plan   There are no diagnoses linked to this encounter.    Additional instructions  Subjective:  Patient presents with venous ulcer of the Bilateral lower extremity.    Procedure:  3 layer unna wrap was placed Bilateral lower extremity.   Plan:   Follow up in one week.  

## 2021-12-10 NOTE — Telephone Encounter (Signed)
[facility] home health nurse left a voicemail requesting a order to faxed for unna wraps due transportation. Orders were faxed over yesterday to [facility] and the home health nurse has been aware ?

## 2021-12-15 ENCOUNTER — Other Ambulatory Visit: Payer: Self-pay

## 2021-12-15 ENCOUNTER — Inpatient Hospital Stay: Payer: Medicare Other | Attending: Oncology

## 2021-12-15 ENCOUNTER — Encounter (INDEPENDENT_AMBULATORY_CARE_PROVIDER_SITE_OTHER): Payer: Self-pay | Admitting: Nurse Practitioner

## 2021-12-15 DIAGNOSIS — Z95828 Presence of other vascular implants and grafts: Secondary | ICD-10-CM

## 2021-12-15 DIAGNOSIS — Z8572 Personal history of non-Hodgkin lymphomas: Secondary | ICD-10-CM | POA: Diagnosis not present

## 2021-12-15 MED ORDER — HEPARIN SOD (PORK) LOCK FLUSH 100 UNIT/ML IV SOLN
500.0000 [IU] | Freq: Once | INTRAVENOUS | Status: AC
  Administered 2021-12-15: 500 [IU] via INTRAVENOUS
  Filled 2021-12-15: qty 5

## 2021-12-15 MED ORDER — SODIUM CHLORIDE 0.9% FLUSH
10.0000 mL | Freq: Once | INTRAVENOUS | Status: AC
  Administered 2021-12-15: 10 mL via INTRAVENOUS
  Filled 2021-12-15: qty 10

## 2021-12-16 DIAGNOSIS — E1051 Type 1 diabetes mellitus with diabetic peripheral angiopathy without gangrene: Secondary | ICD-10-CM | POA: Diagnosis not present

## 2021-12-16 DIAGNOSIS — I130 Hypertensive heart and chronic kidney disease with heart failure and stage 1 through stage 4 chronic kidney disease, or unspecified chronic kidney disease: Secondary | ICD-10-CM | POA: Diagnosis not present

## 2021-12-16 DIAGNOSIS — E1022 Type 1 diabetes mellitus with diabetic chronic kidney disease: Secondary | ICD-10-CM | POA: Diagnosis not present

## 2021-12-16 DIAGNOSIS — I5022 Chronic systolic (congestive) heart failure: Secondary | ICD-10-CM | POA: Diagnosis not present

## 2021-12-16 DIAGNOSIS — N1831 Chronic kidney disease, stage 3a: Secondary | ICD-10-CM | POA: Diagnosis not present

## 2021-12-16 DIAGNOSIS — I890 Lymphedema, not elsewhere classified: Secondary | ICD-10-CM | POA: Diagnosis not present

## 2021-12-17 ENCOUNTER — Other Ambulatory Visit (INDEPENDENT_AMBULATORY_CARE_PROVIDER_SITE_OTHER): Payer: Medicare Other

## 2021-12-17 ENCOUNTER — Encounter (INDEPENDENT_AMBULATORY_CARE_PROVIDER_SITE_OTHER): Payer: Medicare Other

## 2021-12-17 ENCOUNTER — Other Ambulatory Visit: Payer: Self-pay

## 2021-12-17 DIAGNOSIS — E876 Hypokalemia: Secondary | ICD-10-CM

## 2021-12-17 LAB — BASIC METABOLIC PANEL
BUN: 30 mg/dL — ABNORMAL HIGH (ref 6–23)
CO2: 28 mEq/L (ref 19–32)
Calcium: 8.4 mg/dL (ref 8.4–10.5)
Chloride: 101 mEq/L (ref 96–112)
Creatinine, Ser: 1.36 mg/dL (ref 0.40–1.50)
GFR: 51.65 mL/min — ABNORMAL LOW (ref >60.00)
Glucose, Bld: 132 mg/dL — ABNORMAL HIGH (ref 70–99)
Potassium: 3.9 mEq/L (ref 3.5–5.1)
Sodium: 139 mEq/L (ref 135–145)

## 2021-12-18 ENCOUNTER — Other Ambulatory Visit: Payer: Self-pay | Admitting: Internal Medicine

## 2021-12-18 ENCOUNTER — Other Ambulatory Visit (INDEPENDENT_AMBULATORY_CARE_PROVIDER_SITE_OTHER): Payer: Medicare Other

## 2021-12-18 DIAGNOSIS — E039 Hypothyroidism, unspecified: Secondary | ICD-10-CM

## 2021-12-18 DIAGNOSIS — E7800 Pure hypercholesterolemia, unspecified: Secondary | ICD-10-CM

## 2021-12-18 LAB — HEPATIC FUNCTION PANEL
ALT: 7 U/L (ref 0–53)
AST: 14 U/L (ref 0–37)
Albumin: 4.2 g/dL (ref 3.5–5.2)
Alkaline Phosphatase: 124 U/L — ABNORMAL HIGH (ref 39–117)
Bilirubin, Direct: 0.1 mg/dL (ref 0.0–0.3)
Total Bilirubin: 0.4 mg/dL (ref 0.2–1.2)
Total Protein: 7 g/dL (ref 6.0–8.3)

## 2021-12-18 LAB — LIPID PANEL
Cholesterol: 130 mg/dL (ref 0–200)
HDL: 63.5 mg/dL (ref >39.00)
LDL Cholesterol: 57 mg/dL (ref 0–99)
NonHDL: 66.43
Total CHOL/HDL Ratio: 2
Triglycerides: 48 mg/dL (ref 0.0–149.0)
VLDL: 9.6 mg/dL (ref 0.0–40.0)

## 2021-12-18 LAB — TSH: TSH: 1.86 u[IU]/mL (ref 0.35–5.50)

## 2021-12-18 NOTE — Progress Notes (Signed)
Order placed for add on labs.   °

## 2021-12-19 ENCOUNTER — Other Ambulatory Visit: Payer: Self-pay | Admitting: Internal Medicine

## 2021-12-19 DIAGNOSIS — R748 Abnormal levels of other serum enzymes: Secondary | ICD-10-CM

## 2021-12-19 NOTE — Progress Notes (Signed)
Order placed for f/u labs.  

## 2021-12-23 DIAGNOSIS — I5022 Chronic systolic (congestive) heart failure: Secondary | ICD-10-CM | POA: Diagnosis not present

## 2021-12-23 DIAGNOSIS — I890 Lymphedema, not elsewhere classified: Secondary | ICD-10-CM | POA: Diagnosis not present

## 2021-12-23 DIAGNOSIS — E1051 Type 1 diabetes mellitus with diabetic peripheral angiopathy without gangrene: Secondary | ICD-10-CM | POA: Diagnosis not present

## 2021-12-23 DIAGNOSIS — E1022 Type 1 diabetes mellitus with diabetic chronic kidney disease: Secondary | ICD-10-CM | POA: Diagnosis not present

## 2021-12-23 DIAGNOSIS — I130 Hypertensive heart and chronic kidney disease with heart failure and stage 1 through stage 4 chronic kidney disease, or unspecified chronic kidney disease: Secondary | ICD-10-CM | POA: Diagnosis not present

## 2021-12-23 DIAGNOSIS — N1831 Chronic kidney disease, stage 3a: Secondary | ICD-10-CM | POA: Diagnosis not present

## 2021-12-24 ENCOUNTER — Encounter (INDEPENDENT_AMBULATORY_CARE_PROVIDER_SITE_OTHER): Payer: Medicare Other | Admitting: Nurse Practitioner

## 2021-12-29 DIAGNOSIS — E1051 Type 1 diabetes mellitus with diabetic peripheral angiopathy without gangrene: Secondary | ICD-10-CM | POA: Diagnosis not present

## 2021-12-29 DIAGNOSIS — E785 Hyperlipidemia, unspecified: Secondary | ICD-10-CM | POA: Diagnosis not present

## 2021-12-29 DIAGNOSIS — K5900 Constipation, unspecified: Secondary | ICD-10-CM | POA: Diagnosis not present

## 2021-12-29 DIAGNOSIS — K3184 Gastroparesis: Secondary | ICD-10-CM | POA: Diagnosis not present

## 2021-12-29 DIAGNOSIS — D509 Iron deficiency anemia, unspecified: Secondary | ICD-10-CM | POA: Diagnosis not present

## 2021-12-29 DIAGNOSIS — I872 Venous insufficiency (chronic) (peripheral): Secondary | ICD-10-CM | POA: Diagnosis not present

## 2021-12-29 DIAGNOSIS — N1831 Chronic kidney disease, stage 3a: Secondary | ICD-10-CM | POA: Diagnosis not present

## 2021-12-29 DIAGNOSIS — E039 Hypothyroidism, unspecified: Secondary | ICD-10-CM | POA: Diagnosis not present

## 2021-12-29 DIAGNOSIS — Z8572 Personal history of non-Hodgkin lymphomas: Secondary | ICD-10-CM | POA: Diagnosis not present

## 2021-12-29 DIAGNOSIS — Z95828 Presence of other vascular implants and grafts: Secondary | ICD-10-CM | POA: Diagnosis not present

## 2021-12-29 DIAGNOSIS — J439 Emphysema, unspecified: Secondary | ICD-10-CM | POA: Diagnosis not present

## 2021-12-29 DIAGNOSIS — E1043 Type 1 diabetes mellitus with diabetic autonomic (poly)neuropathy: Secondary | ICD-10-CM | POA: Diagnosis not present

## 2021-12-29 DIAGNOSIS — E103519 Type 1 diabetes mellitus with proliferative diabetic retinopathy with macular edema, unspecified eye: Secondary | ICD-10-CM | POA: Diagnosis not present

## 2021-12-29 DIAGNOSIS — Z9181 History of falling: Secondary | ICD-10-CM | POA: Diagnosis not present

## 2021-12-29 DIAGNOSIS — H04123 Dry eye syndrome of bilateral lacrimal glands: Secondary | ICD-10-CM | POA: Diagnosis not present

## 2021-12-29 DIAGNOSIS — I2510 Atherosclerotic heart disease of native coronary artery without angina pectoris: Secondary | ICD-10-CM | POA: Diagnosis not present

## 2021-12-29 DIAGNOSIS — I130 Hypertensive heart and chronic kidney disease with heart failure and stage 1 through stage 4 chronic kidney disease, or unspecified chronic kidney disease: Secondary | ICD-10-CM | POA: Diagnosis not present

## 2021-12-29 DIAGNOSIS — E1042 Type 1 diabetes mellitus with diabetic polyneuropathy: Secondary | ICD-10-CM | POA: Diagnosis not present

## 2021-12-29 DIAGNOSIS — I890 Lymphedema, not elsewhere classified: Secondary | ICD-10-CM | POA: Diagnosis not present

## 2021-12-29 DIAGNOSIS — I5022 Chronic systolic (congestive) heart failure: Secondary | ICD-10-CM | POA: Diagnosis not present

## 2021-12-29 DIAGNOSIS — E1022 Type 1 diabetes mellitus with diabetic chronic kidney disease: Secondary | ICD-10-CM | POA: Diagnosis not present

## 2021-12-29 DIAGNOSIS — F32A Depression, unspecified: Secondary | ICD-10-CM | POA: Diagnosis not present

## 2021-12-29 DIAGNOSIS — Z950 Presence of cardiac pacemaker: Secondary | ICD-10-CM | POA: Diagnosis not present

## 2021-12-30 ENCOUNTER — Ambulatory Visit (INDEPENDENT_AMBULATORY_CARE_PROVIDER_SITE_OTHER): Payer: Medicare Other | Admitting: Nurse Practitioner

## 2021-12-30 ENCOUNTER — Other Ambulatory Visit: Payer: Self-pay

## 2021-12-30 ENCOUNTER — Encounter (INDEPENDENT_AMBULATORY_CARE_PROVIDER_SITE_OTHER): Payer: Self-pay | Admitting: Nurse Practitioner

## 2021-12-30 VITALS — BP 150/73 | HR 86 | Resp 16 | Ht 67.0 in | Wt 227.6 lb

## 2021-12-30 DIAGNOSIS — I890 Lymphedema, not elsewhere classified: Secondary | ICD-10-CM | POA: Diagnosis not present

## 2021-12-30 DIAGNOSIS — E1151 Type 2 diabetes mellitus with diabetic peripheral angiopathy without gangrene: Secondary | ICD-10-CM | POA: Diagnosis not present

## 2021-12-30 DIAGNOSIS — E1022 Type 1 diabetes mellitus with diabetic chronic kidney disease: Secondary | ICD-10-CM | POA: Diagnosis not present

## 2021-12-30 DIAGNOSIS — E1051 Type 1 diabetes mellitus with diabetic peripheral angiopathy without gangrene: Secondary | ICD-10-CM | POA: Diagnosis not present

## 2021-12-30 DIAGNOSIS — N1831 Chronic kidney disease, stage 3a: Secondary | ICD-10-CM | POA: Diagnosis not present

## 2021-12-30 DIAGNOSIS — E7800 Pure hypercholesterolemia, unspecified: Secondary | ICD-10-CM | POA: Diagnosis not present

## 2021-12-30 DIAGNOSIS — I130 Hypertensive heart and chronic kidney disease with heart failure and stage 1 through stage 4 chronic kidney disease, or unspecified chronic kidney disease: Secondary | ICD-10-CM | POA: Diagnosis not present

## 2021-12-30 DIAGNOSIS — I5022 Chronic systolic (congestive) heart failure: Secondary | ICD-10-CM | POA: Diagnosis not present

## 2021-12-30 NOTE — Progress Notes (Signed)
? ?Subjective:  ? ? [name], male    DOB: [birth year], 76 y.o.   MRN: [id number] ?Chief Complaint  ?Patient presents with  ? Follow-up  ?  Unna boot  ? ? ?Gary [name] is a 76 year old male that presents today for follow-up evaluation of lymphedema and lower extremity ulceration.  The patient has been in Zinc wraps for some time now.  Generally these keep his lower extremity swelling under control.  They recently have been taken over by home health.  He had a small area of ulceration on the right lower extremity.  The patient has recently been working with physical therapy and has been much more active and this had a positive effect on his lower extremity edema. ? ? ?Review of Systems  ?Cardiovascular:  Positive for leg swelling.  ?Musculoskeletal:  Positive for gait problem.  ?Skin:  Negative for wound.  ?All other systems reviewed and are negative. ? ?   ?Objective:  ? Physical Exam ?Vitals reviewed.  ?HENT:  ?   Head: Normocephalic.  ?Cardiovascular:  ?   Rate and Rhythm: Normal rate.  ?Pulmonary:  ?   Effort: Pulmonary effort is normal.  ?Skin: ?   General: Skin is warm and dry.  ?Neurological:  ?   Mental Status: He is alert and oriented to person, place, and time.  ?   Motor: Weakness present.  ?   Gait: Gait abnormal.  ?Psychiatric:     ?   Mood and Affect: Mood normal.     ?   Behavior: Behavior normal.     ?   Thought Content: Thought content normal.     ?   Judgment: Judgment normal.  ? ? ?BP (!) 150/73 (BP Location: Right Arm)   Pulse 86   Resp 16   Ht '5\' 7"'$  (1.702 m)   Wt 227 lb 9.6 oz (103.2 kg)   BMI 35.65 kg/m?  ? ?Past Medical History:  ?Diagnosis Date  ? CHF (congestive heart failure) ([place])   ? Depression   ? 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 Gastroparesis   ? Graves disease   ? Hypercholesterolemia   ? Hypertension   ? Mantle cell lymphoma ([place]) 07/2014  ? Peripheral neuropathy   ? Shingles   ? ? ?Social History   ? ?Socioeconomic History  ? Marital status: Widowed  ?  Spouse name: Not on file  ? Number of children: 1  ? Years of education: 45  ? Highest education level: 12th grade  ?Occupational History  ? Occupation: Retired  ?Tobacco Use  ? Smoking status: Former  ? Smokeless tobacco: Current  ?  Types: Chew  ?Vaping Use  ? Vaping Use: Never used  ?Substance and Sexual Activity  ? Alcohol use: Yes  ?  Alcohol/week: 3.0 standard drinks  ?  Types: 3 Cans of beer per week  ?  Comment: nightly  ? Drug use: Never  ? Sexual activity: Not Currently  ?Other Topics Concern  ? Not on file  ?Social History Narrative  ? Not on file  ? ?Social Determinants of Health  ? ?Financial Resource Strain: Low Risk   ? Difficulty of Paying Living Expenses: Not hard at all  ?Food Insecurity: No Food Insecurity  ? Worried About Charity fundraiser in the Last Year: Never true  ? Ran Out of Food in the Last Year: Never true  ?Transportation Needs: No Transportation Needs  ?  Lack of Transportation (Medical): No  ? Lack of Transportation (Non-Medical): No  ?Physical Activity: Not on file  ?Stress: Not on file  ?Social Connections: Socially Isolated  ? Frequency of Communication with Friends and Family: More than three times a week  ? Frequency of Social Gatherings with Friends and Family: Twice a week  ? Attends Religious Services: Never  ? Active Member of Clubs or Organizations: No  ? Attends Archivist Meetings: Never  ? Marital Status: Widowed  ?Intimate Partner Violence: Not At Risk  ? Fear of Current or Ex-Partner: No  ? Emotionally Abused: No  ? Physically Abused: No  ? Sexually Abused: No  ? ? ?Past Surgical History:  ?Procedure Laterality Date  ? CHOLECYSTECTOMY    ? NEPHRECTOMY    ? right  ? PACEMAKER INSERTION N/A 12/07/2017  ? Procedure: INSERTION PACEMAKER DUAL CHAMBER INITIAL INSERT;  Surgeon: Isaias Cowman, MD;  Location: [facility] ORS;  Service: Cardiovascular;  Laterality: N/A;  ? PORTA CATH INSERTION N/A 11/03/2017  ?  Procedure: PORTA CATH INSERTION;  Surgeon: Katha Cabal, MD;  Location: [facility];  Service: Cardiovascular;  Laterality: N/A;  ? ? ?Family History  ?Problem Relation Age of Onset  ? Breast cancer Mother   ? Diabetes Father   ? Cancer Father   ?     Lymphoma  ? Alcohol abuse Maternal Uncle   ? Diabetes Sister   ? Heart disease Other   ?     grandfather  ? ? ?Allergies  ?Allergen Reactions  ? Rofecoxib Nausea Only  ? ? ?CBC Latest Ref Rng & Units 10/17/2021 09/13/2021 09/07/2021  ?WBC 3.8 - 10.8 Thousand/uL 5.2 2.8(L) 2.9(L)  ?Hemoglobin 13.2 - 17.1 g/dL 13.1(L) 13.5 14.2  ?Hematocrit 38.5 - 50.0 % 39.5 40.8 42.9  ?Platelets 140 - 400 Thousand/uL 149 122(L) 103(L)  ? ? ? ? ?CMP  ?   ?Component Value Date/Time  ? NA 139 12/17/2021 1041  ? NA 138 07/15/2014 0515  ? K 3.9 12/17/2021 1041  ? K 3.5 01/16/2015 1429  ? CL 101 12/17/2021 1041  ? CL 110 (H) 07/15/2014 0515  ? CO2 28 12/17/2021 1041  ? CO2 24 07/15/2014 0515  ? GLUCOSE 132 (H) 12/17/2021 1041  ? GLUCOSE 145 (H) 07/15/2014 0515  ? BUN 30 (H) 12/17/2021 1041  ? BUN 17 07/15/2014 0515  ? CREATININE 1.36 12/17/2021 1041  ? CREATININE 1.38 (H) 10/17/2021 1624  ? CALCIUM 8.4 12/17/2021 1041  ? CALCIUM 7.1 (L) 07/15/2014 0515  ? PROT 7.0 12/18/2021 0909  ? PROT 6.2 (L) 10/22/2014 1000  ? ALBUMIN 4.2 12/18/2021 0909  ? ALBUMIN 2.6 (L) 10/22/2014 1000  ? AST 14 12/18/2021 0909  ? AST 19 10/22/2014 1000  ? ALT 7 12/18/2021 0909  ? ALT 21 10/22/2014 1000  ? ALKPHOS 124 (H) 12/18/2021 3664  ? ALKPHOS 118 (H) 10/22/2014 1000  ? BILITOT 0.4 12/18/2021 0909  ? BILITOT 0.8 10/22/2014 1000  ? GFRNONAA 48 (L) 09/13/2021 4034  ? GFRNONAA >60 01/16/2015 1429  ? GFRAA 41 (L) 01/24/2020 0925  ? GFRAA >60 01/16/2015 1429  ? ? ? ?No results found. ? ?   ?Assessment & Plan:  ? ?1. Lymphedema ?Patient's lower extremity edema is doing well.  He had a previous positive but this is essentially healed on the right lower extremity.  The patient has been getting Unna wraps done  by home health since his recent discharge from rehab.  These will continue.  We will have him follow-up in 6 weeks. ? ?2. Hypercholesterolemia ?Continue statin as ordered and reviewed, no changes at this time  ? ?3. Type 2 diabetes mellitus with diabetic peripheral angiopathy without gangrene, without long-term current use of insulin ([place]) ?Continue hypoglycemic medications as already ordered, these medications have been reviewed and there are no changes at this time. ? ?Hgb A1C to be monitored as already arranged by primary service  ? ? ?Current Outpatient Medications on File Prior to Visit  ?Medication Sig Dispense Refill  ? bacitracin 500 UNIT/GM ointment Apply 1 application topically daily.    ? Cholecalciferol (VITAMIN D3) 50 MCG (2000 UT) CAPS Take 2,000 Units by mouth daily.    ? feeding supplement, GLUCERNA SHAKE, (GLUCERNA SHAKE) LIQD Take 237 mLs by mouth 2 (two) times daily between meals. 237 mL 0  ? ferrous sulfate 325 (65 FE) MG tablet Take 325 mg by mouth daily with breakfast.    ? FLUoxetine (PROZAC) 20 MG capsule Take 1 capsule (20 mg total) by mouth daily.  3  ? furosemide (LASIX) 40 MG tablet Take 1 tablet (40 mg total) by mouth daily. 30 tablet   ? glucose 4 GM chewable tablet Chew 1 tablet (4 g total) by mouth once as needed for low blood sugar. 50 tablet 0  ? insulin aspart (NOVOLOG) 100 UNIT/ML injection Inject 0-9 Units into the skin 3 (three) times daily with meals. CBG 70 - 120: 0 units CBG 121 - 150: 1 unit CBG 151 - 200: 2 units CBG 201 - 250: 3 units CBG 251 - 300: 5 units CBG 301 - 350: 7 units CBG 351 - 400: 9 units    ? insulin aspart (NOVOLOG) 100 UNIT/ML injection Inject 0-5 Units into the skin at bedtime. CBG 70 - 120: 0 units CBG 121 - 150: 0 units CBG 151 - 200: 0 units CBG 201 - 250: 2 units CBG 251 - 300: 3 units CBG 301 - 350: 4 units CBG 351 - 400: 5 units    ? insulin aspart (NOVOLOG) 100 UNIT/ML injection Inject 6 Units into the skin 3 (three) times daily with meals.    ?  insulin glargine-yfgn (SEMGLEE) 100 UNIT/ML injection Inject 0.25 mLs (25 Units total) into the skin daily. (Patient taking differently: Inject 16 Units into the skin daily.) 10 mL 11  ? levothyroxine (SYNTHROID) 25 MC

## 2021-12-31 ENCOUNTER — Ambulatory Visit (INDEPENDENT_AMBULATORY_CARE_PROVIDER_SITE_OTHER): Payer: Medicare Other | Admitting: *Deleted

## 2021-12-31 DIAGNOSIS — I890 Lymphedema, not elsewhere classified: Secondary | ICD-10-CM

## 2021-12-31 DIAGNOSIS — E1151 Type 2 diabetes mellitus with diabetic peripheral angiopathy without gangrene: Secondary | ICD-10-CM

## 2021-12-31 DIAGNOSIS — I5022 Chronic systolic (congestive) heart failure: Secondary | ICD-10-CM

## 2021-12-31 NOTE — Patient Instructions (Addendum)
Visit Information ? ?Thank you for taking time to visit with me today. Please don't hesitate to contact me if I can be of assistance to you before our next scheduled telephone appointment. ? ?Following are the goals we discussed today:  ?Check blood sugar at least 5 times a day ?Check blood sugar if I feel it is too high or too low ?Take the blood sugar meter and log to all doctor visits  ?Consider drinking Boost or other supplement in the evenings if not eating a complete meal ?Make sure to have candy or glucose tablets on you at all times; try drinking coke along with eating snack to treat hypoglycemia ?Eat at least 3 meals a day with snack in between to help prevent hypoglycemia ?Please consider/participate with Home Health Nursing/Therapy involvement ?Fall precautions and preventions; Wear life alert at all times ?Contact Endocrinology for reinstating insulin pump ?Weigh self daily using walker to balance; request help when putting new batteries in scale ?Low salt carb modified diet ?Schedule appointment with Endocrinology ASAP ? ? ?FYI: BELOW IS CARE GUIDE THAT MAY OFFER OTHER TRANSPORTATION OPTIONS.  PLEASE GIVE HER A CALL IF NEED TO---- ?   Milicent Adams, [place], CHC ?Care Guide  Embedded Care Coordination ?[facility]  ?[address] ?[place], [place] [address] ???[email or web address]  ?? [phone]   ?[email or web address] ? ? ? ? ? ?Our next appointment is by telephone on 4/21 at 1000 ? ?Please call the care guide team at [phone] if you need to cancel or reschedule your appointment.  ? ?If you are experiencing a Mental Health or [place] or need someone to talk to, please call the Suicide and Crisis Lifeline: 988 ?call the [place] National Suicide Prevention Lifeline: 669-087-7273 or TTY: 504-367-6260 TTY (216)590-2153) to talk to a trained counselor ?call 1-800-273-TALK (toll free, 24 hour hotline) ?call 911  ? ?Patient verbalizes understanding of  instructions and care plan provided today and agrees to view in [place]. Active MyChart status confirmed with patient.   ? ?[name] RN, MSN ?RN Care Management Coordinator ?[facility] ?[phone] ?[email or web address] ?  ?

## 2022-01-04 ENCOUNTER — Encounter: Payer: Self-pay | Admitting: Internal Medicine

## 2022-01-04 NOTE — Chronic Care Management (AMB) (Signed)
?Chronic Care Management  ? ?CCM RN Visit Note ? ?01/04/2022 ?Name: Gary [name] MRN: [id number] DOB: 1945/11/25 ? ?Subjective: ?Gary [name] is a 76 y.o. year old male who is a primary care patient of [name], MD. The care management team was consulted for assistance with disease management and care coordination needs.   ? ?Engaged with patient by telephone for follow up visit in response to provider referral for case management and/or care coordination services.  ? ?Consent to Services:  ?The patient was given information about Chronic Care Management services, agreed to services, and gave verbal consent prior to initiation of services.  Please see initial visit note for detailed documentation.  ? ?Patient agreed to services and verbal consent obtained.  ? ?Assessment: Review of patient past medical history, allergies, medications, health status, including review of consultants reports, laboratory and other test data, was performed as part of comprehensive evaluation and provision of chronic care management services.  ? ?SDOH (Social Determinants of Health) assessments and interventions performed:   ? ?CCM Care Plan ? ?Allergies  ?Allergen Reactions  ? Rofecoxib Nausea Only  ? ? ?Outpatient Encounter Medications as of 12/31/2021  ?Medication Sig  ? acetaminophen (TYLENOL) 325 MG tablet Take 650 mg by mouth every 6 (six) hours as needed. (Patient not taking: Reported on 12/30/2021)  ? bacitracin 500 UNIT/GM ointment Apply 1 application topically daily.  ? carvedilol (COREG) 3.125 MG tablet Take 1 tablet (3.125 mg total) by mouth 2 (two) times daily with a meal. Hold if systolic <379 and HR <02 (Patient not taking: Reported on 10/17/2021)  ? Cholecalciferol (VITAMIN D3) 50 MCG (2000 UT) CAPS Take 2,000 Units by mouth daily.  ? feeding supplement, GLUCERNA SHAKE, (GLUCERNA SHAKE) LIQD Take 237 mLs by mouth 2 (two) times daily between meals.  ? ferrous sulfate 325 (65 FE) MG tablet Take 325 mg by mouth daily with  breakfast.  ? FLUoxetine (PROZAC) 20 MG capsule Take 1 capsule (20 mg total) by mouth daily.  ? furosemide (LASIX) 40 MG tablet Take 1 tablet (40 mg total) by mouth daily.  ? glucose 4 GM chewable tablet Chew 1 tablet (4 g total) by mouth once as needed for low blood sugar.  ? insulin aspart (NOVOLOG) 100 UNIT/ML injection Inject 0-9 Units into the skin 3 (three) times daily with meals. CBG 70 - 120: 0 units CBG 121 - 150: 1 unit CBG 151 - 200: 2 units CBG 201 - 250: 3 units CBG 251 - 300: 5 units CBG 301 - 350: 7 units CBG 351 - 400: 9 units  ? insulin aspart (NOVOLOG) 100 UNIT/ML injection Inject 0-5 Units into the skin at bedtime. CBG 70 - 120: 0 units CBG 121 - 150: 0 units CBG 151 - 200: 0 units CBG 201 - 250: 2 units CBG 251 - 300: 3 units CBG 301 - 350: 4 units CBG 351 - 400: 5 units  ? insulin aspart (NOVOLOG) 100 UNIT/ML injection Inject 6 Units into the skin 3 (three) times daily with meals.  ? insulin glargine-yfgn (SEMGLEE) 100 UNIT/ML injection Inject 0.25 mLs (25 Units total) into the skin daily. (Patient taking differently: Inject 16 Units into the skin daily.)  ? levothyroxine (SYNTHROID) 25 MCG tablet Take 25 mcg by mouth daily before breakfast.  ? lidocaine (LIDODERM) 5 % Place 1 patch onto the skin daily. Remove & Discard patch within 12 hours or as directed by MD  ? methocarbamol (ROBAXIN) 750 MG tablet Take 1 tablet (750  mg total) by mouth every 6 (six) hours as needed for muscle spasms.  ? polyethylene glycol (MIRALAX / GLYCOLAX) 17 g packet Take 17 g by mouth 2 (two) times daily.  ? polyvinyl alcohol (LIQUIFILM TEARS) 1.4 % ophthalmic solution Place 1 drop into both eyes as needed for dry eyes.  ? potassium chloride (KLOR-CON) 10 MEQ tablet TAKE 1 TABLET BY MOUTH 2 TIMES DAILY.  ? pravastatin (PRAVACHOL) 40 MG tablet Take 40 mg by mouth daily.   ? vitamin B-12 (CYANOCOBALAMIN) 1000 MCG tablet Take 1,000 mcg by mouth daily.  ? ?Facility-Administered Encounter Medications as of 12/31/2021   ?Medication  ? heparin lock flush 100 unit/mL  ? sodium chloride flush (NS) 0.9 % injection 10 mL  ? sodium chloride flush (NS) 0.9 % injection 10 mL  ? ? ?Patient Active Problem List  ? Diagnosis Date Noted  ? Recurrent falls 08/29/2021  ? 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 Left rib fracture_7th rib   ? Leukocytosis   ? HTN (hypertension)   ? Hypothyroid   ? CAD (coronary artery disease)   ? Porokeratosis 05/15/2021  ? Aortic atherosclerosis ([place]) 05/05/2021  ? Emphysema lung ([place]) 05/05/2021  ? Thrombocytopenia ([place]) 04/29/2021  ? Splinter of foot without infection 02/23/2021  ? Arm laceration 02/09/2021  ? Hypokalemia 10/24/2020  ? Injury by nail 08/15/2020  ? Shingles 07/24/2020  ? Rash 07/02/2020  ? Colon cancer screening 07/02/2020  ? Sick sinus syndrome ([place]) 12/28/2019  ? Acquired absence of kidney 09/13/2019  ? Edema of lower extremity 09/13/2019  ? 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 Proteinuria 09/13/2019  ? Lower extremity pain, bilateral   ? Diabetic foot ulcer associated with type 1 diabetes mellitus ([place])   ? Acute on chronic systolic CHF (congestive heart failure) ([place])   ? Cellulitis 08/18/2019  ? Lower limb ulcer, calf ([place]) 08/01/2019  ? Left hip pain 07/16/2019  ? Pain due to onychomycosis of toenails of both feet 04/03/2019  ? Swelling of limb 02/07/2019  ? CKD (chronic kidney disease) stage 3, GFR 30-59 ml/min (HCC) 09/16/2018  ? Left shoulder pain 09/16/2018  ? Chronic cholecystitis 12/29/2017  ? Graves disease 12/29/2017  ? Peripheral neuropathy 12/29/2017  ? S/p nephrectomy 12/29/2017  ? 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 Symptomatic bradycardia 12/02/2017  ? CKD (chronic kidney disease), stage IIIa 12/02/2017  ? Chronic systolic CHF (congestive heart failure) ([place]) 12/02/2017  ? Saturday night paralysis 11/12/2017  ? Hypoglycemia 10/28/2017  ? Goals of care, counseling/discussion  10/24/2017  ? Lymphedema 10/20/2017  ? Iron deficiency anemia 09/11/2017  ? Fall 08/13/2017  ? Humerus fracture 08/13/2017  ? Anemia 01/12/2017  ? Bilateral carotid artery stenosis 06/29/2016  ? Hand laceration 12/16/2015  ? Adjustment disorder with depressed mood 08/11/2015  ? Cardiomyopathy, idiopathic ([place]) 04/22/2015  ? CAD in native artery 11/02/2014  ? Mantle cell lymphoma ([place]) 08/03/2014  ? History of colonic polyps 04/29/2014  ? Irregular heart beat 04/22/2014  ? SOB (shortness of breath) 04/22/2014  ? Stress 03/21/2014  ? PVC (premature ventricular contraction) 02/21/2014  ? GERD (gastroesophageal reflux disease) 10/23/2013  ? Gastroparesis 06/25/2013  ? Chest pain 04/13/2013  ? Thyroid disease 04/13/2013  ? Diabetes ([place]) 04/13/2013  ? Essential hypertension, benign 04/13/2013  ? Hypercholesterolemia 04/13/2013  ? ? ?Conditions to be addressed/monitored:CHF, HTN, and DMII ? ?Care Plan : RNCM  Diabetes  ?Updates made by [name]  D, RN since 01/04/2022 12:00 AM  ?  ? ?Problem: Hypoglycemia causing syncopal episodes   ?Priority: Medium  ?  ? ?Long-Range Goal: Patient will report maintaing Hgb A1C of 7 or below in the next 90 days   ?Start Date: 01/27/2021  ?Expected End Date: 02/08/2022  ?Recent Progress: Not on track  ?Priority: Medium  ?Note:   ?Objective:  ?Lab Results  ?Component Value Date  ? HGBA1C 7.0 01/23/2021  ?Current Barriers:  ?Knowledge Deficits related to basic Diabetes pathophysiology and self care/management as evidenced by multiple syncopal episodes related to hypoglycemia.  ?Limited Social Support   ?Trouble getting insulin pump supplies-reports he now has insulin pump sensors ? Latest Hgb A1C 6.9 on 08/12/21.  Just discharged from SNF few weeks from  recent hospitalization for fall and hypoglycemia.  States he turned his insulin pump back on when he returned home.  Has not poken with Endocrino;ogy.  Reports blood sugars have ranged 120's with a few hypoglycemic events in th 50's.   Fasting this morning 50.  Instructed to contact Endocrnologist as soon as possible.  Does report the home health agency has made contact (unsure  of the name), he is just waiting for them to come out (1/5 or 1/6).  D

## 2022-01-05 DIAGNOSIS — I130 Hypertensive heart and chronic kidney disease with heart failure and stage 1 through stage 4 chronic kidney disease, or unspecified chronic kidney disease: Secondary | ICD-10-CM | POA: Diagnosis not present

## 2022-01-05 DIAGNOSIS — E1022 Type 1 diabetes mellitus with diabetic chronic kidney disease: Secondary | ICD-10-CM | POA: Diagnosis not present

## 2022-01-05 DIAGNOSIS — E1051 Type 1 diabetes mellitus with diabetic peripheral angiopathy without gangrene: Secondary | ICD-10-CM | POA: Diagnosis not present

## 2022-01-05 DIAGNOSIS — I890 Lymphedema, not elsewhere classified: Secondary | ICD-10-CM | POA: Diagnosis not present

## 2022-01-05 DIAGNOSIS — N1831 Chronic kidney disease, stage 3a: Secondary | ICD-10-CM | POA: Diagnosis not present

## 2022-01-05 DIAGNOSIS — I5022 Chronic systolic (congestive) heart failure: Secondary | ICD-10-CM | POA: Diagnosis not present

## 2022-01-06 DIAGNOSIS — E1069 Type 1 diabetes mellitus with other specified complication: Secondary | ICD-10-CM | POA: Diagnosis not present

## 2022-01-06 DIAGNOSIS — E10649 Type 1 diabetes mellitus with hypoglycemia without coma: Secondary | ICD-10-CM | POA: Diagnosis not present

## 2022-01-06 DIAGNOSIS — E785 Hyperlipidemia, unspecified: Secondary | ICD-10-CM | POA: Diagnosis not present

## 2022-01-09 DIAGNOSIS — I5022 Chronic systolic (congestive) heart failure: Secondary | ICD-10-CM | POA: Diagnosis not present

## 2022-01-09 DIAGNOSIS — E1151 Type 2 diabetes mellitus with diabetic peripheral angiopathy without gangrene: Secondary | ICD-10-CM

## 2022-01-12 DIAGNOSIS — I5022 Chronic systolic (congestive) heart failure: Secondary | ICD-10-CM | POA: Diagnosis not present

## 2022-01-12 DIAGNOSIS — N1831 Chronic kidney disease, stage 3a: Secondary | ICD-10-CM | POA: Diagnosis not present

## 2022-01-12 DIAGNOSIS — E1022 Type 1 diabetes mellitus with diabetic chronic kidney disease: Secondary | ICD-10-CM | POA: Diagnosis not present

## 2022-01-12 DIAGNOSIS — I130 Hypertensive heart and chronic kidney disease with heart failure and stage 1 through stage 4 chronic kidney disease, or unspecified chronic kidney disease: Secondary | ICD-10-CM | POA: Diagnosis not present

## 2022-01-12 DIAGNOSIS — E1051 Type 1 diabetes mellitus with diabetic peripheral angiopathy without gangrene: Secondary | ICD-10-CM | POA: Diagnosis not present

## 2022-01-12 DIAGNOSIS — I890 Lymphedema, not elsewhere classified: Secondary | ICD-10-CM | POA: Diagnosis not present

## 2022-01-14 ENCOUNTER — Telehealth: Payer: Self-pay

## 2022-01-14 ENCOUNTER — Other Ambulatory Visit: Payer: Self-pay

## 2022-01-14 NOTE — Telephone Encounter (Signed)
Patient was notified about transportation being set up for 4/10 appt. Also notified that he must answer call from cab company in order for cab to be sent out  ?

## 2022-01-15 ENCOUNTER — Telehealth: Payer: Self-pay | Admitting: Internal Medicine

## 2022-01-15 NOTE — Telephone Encounter (Signed)
Demetria from [facility] want to know if the provider has received the social worker notes  ?

## 2022-01-15 NOTE — Telephone Encounter (Signed)
Izora Gala from [facility] called stating she need social worker evaluation notes ?Fax number [phone]  ?

## 2022-01-19 ENCOUNTER — Other Ambulatory Visit (INDEPENDENT_AMBULATORY_CARE_PROVIDER_SITE_OTHER): Payer: Medicare Other

## 2022-01-19 DIAGNOSIS — R748 Abnormal levels of other serum enzymes: Secondary | ICD-10-CM

## 2022-01-19 LAB — HEPATIC FUNCTION PANEL
ALT: 8 U/L (ref 0–53)
AST: 14 U/L (ref 0–37)
Albumin: 4.2 g/dL (ref 3.5–5.2)
Alkaline Phosphatase: 126 U/L — ABNORMAL HIGH (ref 39–117)
Bilirubin, Direct: 0.1 mg/dL (ref 0.0–0.3)
Total Bilirubin: 0.5 mg/dL (ref 0.2–1.2)
Total Protein: 6.7 g/dL (ref 6.0–8.3)

## 2022-01-19 LAB — GAMMA GT: GGT: 40 U/L (ref 7–51)

## 2022-01-19 NOTE — Telephone Encounter (Signed)
Gary [name] is calling wanting an update to see if we receive the social worker notes ?

## 2022-01-19 NOTE — Telephone Encounter (Signed)
Most recent notes faxed ?

## 2022-01-20 ENCOUNTER — Inpatient Hospital Stay: Payer: Medicare Other

## 2022-01-20 ENCOUNTER — Inpatient Hospital Stay: Payer: Medicare Other | Attending: Oncology

## 2022-01-20 DIAGNOSIS — C8310 Mantle cell lymphoma, unspecified site: Secondary | ICD-10-CM | POA: Insufficient documentation

## 2022-01-20 DIAGNOSIS — Z95828 Presence of other vascular implants and grafts: Secondary | ICD-10-CM

## 2022-01-20 DIAGNOSIS — Z452 Encounter for adjustment and management of vascular access device: Secondary | ICD-10-CM | POA: Insufficient documentation

## 2022-01-20 MED ORDER — SODIUM CHLORIDE 0.9% FLUSH
10.0000 mL | INTRAVENOUS | Status: DC | PRN
  Administered 2022-01-20: 10 mL via INTRAVENOUS
  Filled 2022-01-20: qty 10

## 2022-01-20 MED ORDER — HEPARIN SOD (PORK) LOCK FLUSH 100 UNIT/ML IV SOLN
500.0000 [IU] | Freq: Once | INTRAVENOUS | Status: AC
  Administered 2022-01-20: 500 [IU] via INTRAVENOUS
  Filled 2022-01-20: qty 5

## 2022-01-22 ENCOUNTER — Telehealth: Payer: Self-pay | Admitting: Internal Medicine

## 2022-01-22 DIAGNOSIS — E1022 Type 1 diabetes mellitus with diabetic chronic kidney disease: Secondary | ICD-10-CM | POA: Diagnosis not present

## 2022-01-22 DIAGNOSIS — I130 Hypertensive heart and chronic kidney disease with heart failure and stage 1 through stage 4 chronic kidney disease, or unspecified chronic kidney disease: Secondary | ICD-10-CM | POA: Diagnosis not present

## 2022-01-22 DIAGNOSIS — N1831 Chronic kidney disease, stage 3a: Secondary | ICD-10-CM | POA: Diagnosis not present

## 2022-01-22 DIAGNOSIS — E1051 Type 1 diabetes mellitus with diabetic peripheral angiopathy without gangrene: Secondary | ICD-10-CM | POA: Diagnosis not present

## 2022-01-22 DIAGNOSIS — I5022 Chronic systolic (congestive) heart failure: Secondary | ICD-10-CM | POA: Diagnosis not present

## 2022-01-22 DIAGNOSIS — I890 Lymphedema, not elsewhere classified: Secondary | ICD-10-CM | POA: Diagnosis not present

## 2022-01-22 NOTE — Telephone Encounter (Signed)
[facility] called about outstanding orders  ?Plan care order [id number] ?[facility] order [id number] and [id number] ?

## 2022-01-23 ENCOUNTER — Telehealth: Payer: Self-pay | Admitting: *Deleted

## 2022-01-23 NOTE — Telephone Encounter (Signed)
Given to Dr. Nicki Reaper ?

## 2022-01-23 NOTE — Telephone Encounter (Signed)
[facility] called back updating medication list. ?

## 2022-01-23 NOTE — Telephone Encounter (Signed)
Called [facility] to clarify medication llist, computer system down [facility] will callback when computer comes up. ?

## 2022-01-24 DIAGNOSIS — E1022 Type 1 diabetes mellitus with diabetic chronic kidney disease: Secondary | ICD-10-CM | POA: Diagnosis not present

## 2022-01-24 DIAGNOSIS — I130 Hypertensive heart and chronic kidney disease with heart failure and stage 1 through stage 4 chronic kidney disease, or unspecified chronic kidney disease: Secondary | ICD-10-CM | POA: Diagnosis not present

## 2022-01-24 DIAGNOSIS — I5022 Chronic systolic (congestive) heart failure: Secondary | ICD-10-CM | POA: Diagnosis not present

## 2022-01-24 DIAGNOSIS — E1051 Type 1 diabetes mellitus with diabetic peripheral angiopathy without gangrene: Secondary | ICD-10-CM | POA: Diagnosis not present

## 2022-01-24 DIAGNOSIS — N1831 Chronic kidney disease, stage 3a: Secondary | ICD-10-CM | POA: Diagnosis not present

## 2022-01-24 DIAGNOSIS — I890 Lymphedema, not elsewhere classified: Secondary | ICD-10-CM | POA: Diagnosis not present

## 2022-01-26 ENCOUNTER — Encounter: Payer: Self-pay | Admitting: *Deleted

## 2022-01-26 DIAGNOSIS — Z95828 Presence of other vascular implants and grafts: Secondary | ICD-10-CM

## 2022-01-26 DIAGNOSIS — I872 Venous insufficiency (chronic) (peripheral): Secondary | ICD-10-CM | POA: Diagnosis not present

## 2022-01-26 DIAGNOSIS — N1831 Chronic kidney disease, stage 3a: Secondary | ICD-10-CM | POA: Diagnosis not present

## 2022-01-26 DIAGNOSIS — I2510 Atherosclerotic heart disease of native coronary artery without angina pectoris: Secondary | ICD-10-CM | POA: Diagnosis not present

## 2022-01-26 DIAGNOSIS — E103519 Type 1 diabetes mellitus with proliferative diabetic retinopathy with macular edema, unspecified eye: Secondary | ICD-10-CM

## 2022-01-26 DIAGNOSIS — H04123 Dry eye syndrome of bilateral lacrimal glands: Secondary | ICD-10-CM

## 2022-01-26 DIAGNOSIS — J439 Emphysema, unspecified: Secondary | ICD-10-CM | POA: Diagnosis not present

## 2022-01-26 DIAGNOSIS — F32A Depression, unspecified: Secondary | ICD-10-CM

## 2022-01-26 DIAGNOSIS — E1051 Type 1 diabetes mellitus with diabetic peripheral angiopathy without gangrene: Secondary | ICD-10-CM | POA: Diagnosis not present

## 2022-01-26 DIAGNOSIS — E1022 Type 1 diabetes mellitus with diabetic chronic kidney disease: Secondary | ICD-10-CM | POA: Diagnosis not present

## 2022-01-26 DIAGNOSIS — I890 Lymphedema, not elsewhere classified: Secondary | ICD-10-CM | POA: Diagnosis not present

## 2022-01-26 DIAGNOSIS — Z9181 History of falling: Secondary | ICD-10-CM

## 2022-01-26 DIAGNOSIS — K3184 Gastroparesis: Secondary | ICD-10-CM | POA: Diagnosis not present

## 2022-01-26 DIAGNOSIS — K5900 Constipation, unspecified: Secondary | ICD-10-CM

## 2022-01-26 DIAGNOSIS — D509 Iron deficiency anemia, unspecified: Secondary | ICD-10-CM

## 2022-01-26 DIAGNOSIS — E785 Hyperlipidemia, unspecified: Secondary | ICD-10-CM

## 2022-01-26 DIAGNOSIS — E1043 Type 1 diabetes mellitus with diabetic autonomic (poly)neuropathy: Secondary | ICD-10-CM | POA: Diagnosis not present

## 2022-01-26 DIAGNOSIS — E1042 Type 1 diabetes mellitus with diabetic polyneuropathy: Secondary | ICD-10-CM | POA: Diagnosis not present

## 2022-01-26 DIAGNOSIS — I130 Hypertensive heart and chronic kidney disease with heart failure and stage 1 through stage 4 chronic kidney disease, or unspecified chronic kidney disease: Secondary | ICD-10-CM | POA: Diagnosis not present

## 2022-01-26 DIAGNOSIS — I5022 Chronic systolic (congestive) heart failure: Secondary | ICD-10-CM | POA: Diagnosis not present

## 2022-01-26 DIAGNOSIS — Z950 Presence of cardiac pacemaker: Secondary | ICD-10-CM

## 2022-01-26 DIAGNOSIS — E039 Hypothyroidism, unspecified: Secondary | ICD-10-CM

## 2022-01-26 DIAGNOSIS — Z8572 Personal history of non-Hodgkin lymphomas: Secondary | ICD-10-CM

## 2022-01-26 MED ORDER — FLUOXETINE HCL 20 MG PO CAPS: 20.0000 mg | ORAL_CAPSULE | Freq: Every day | ORAL | 3 refills | Status: DC

## 2022-01-26 NOTE — Telephone Encounter (Signed)
Medication reconciliation called [facility] was advised the only medication they have listed are on the current chart that was faxed to office. Called patient to verify medication and patient list is update dto not taking are you with me removing the medications patient reports not taking . The carvedilol he stated was DC at last admission due to falling and then was restarted when he left rehab, Patient reported he stopped taking due to low BP at home and feeling he was going to faint, stopped without orders.  Patient says Prozac was also DC several months ago , and that Dr. Bea Laura changed Furosemide regimen copied from chart this script AND SIG.  ?Prescription Sig Dispensed Refills Start Date End Date  ?FUROsemide (LASIX) 40 MG tablet   TAKE 1 TABLET (40 MG TOTAL) BY MOUTH 3 (THREE) TIMES DAILY TWO TABLETS IN THE MORNING, AND ONE IN THE AFTERNOON AS NEEDED. 270 tablet   1 11/19/2021 11/19/2022  ? ?CURRENT INSULIN PUMP REGIEM pATED BELOW. ? ?Overall, he says his sugars are doing well. He has some highs and some lows, but no different than usual.  ? ?He is currently on a Medtronic 630 G insulin pump (out of warranty in 11/22):  ?Basal rates ?Midnight = 0.675 ?6 AM = 0.8 ?12 PM = 0.75 ?9 PM = 0.775 ?TDD basal: 17.925 units ? ?Bolus settings ?I/C: 9 ?ISF: 60 ?Target Glucose: 100-130 ?Active insulin time: 3 hours ? ?Total Daily dose approximately 80-100 units. He changes his site every 3 days as evidenced on his 30-day download. ?His pump was downloaded and reviewed. He uses the Dexcom for CGMS, it was not downloaded and reviewed today as today's visit was video. He tries to watch his diet. He previously admitted to drinking regular sodas throughout the day. He has some loss of feeling in his feet, but has no pain currently. He is concerned about his diabetes. ? ?I have marked Medications for removal if you approve. ?

## 2022-01-26 NOTE — Telephone Encounter (Signed)
Would like to restart prozac.  If cardiology and endocrinology - have given new orders - agree with sending copy of their orders.  Will hold coreg if blood pressure is bottoming out - until can be reevaluated.   ?

## 2022-01-26 NOTE — Telephone Encounter (Signed)
Patient will restart prozac , home health forms faxed with updated medication list, copy for billing and sent to scan. ?

## 2022-01-28 DIAGNOSIS — N1831 Chronic kidney disease, stage 3a: Secondary | ICD-10-CM | POA: Diagnosis not present

## 2022-01-28 DIAGNOSIS — E10649 Type 1 diabetes mellitus with hypoglycemia without coma: Secondary | ICD-10-CM | POA: Diagnosis not present

## 2022-01-28 DIAGNOSIS — I872 Venous insufficiency (chronic) (peripheral): Secondary | ICD-10-CM | POA: Diagnosis not present

## 2022-01-28 DIAGNOSIS — I890 Lymphedema, not elsewhere classified: Secondary | ICD-10-CM | POA: Diagnosis not present

## 2022-01-28 DIAGNOSIS — E1159 Type 2 diabetes mellitus with other circulatory complications: Secondary | ICD-10-CM | POA: Diagnosis not present

## 2022-01-28 DIAGNOSIS — Z950 Presence of cardiac pacemaker: Secondary | ICD-10-CM | POA: Diagnosis not present

## 2022-01-28 DIAGNOSIS — Z8572 Personal history of non-Hodgkin lymphomas: Secondary | ICD-10-CM | POA: Diagnosis not present

## 2022-01-28 DIAGNOSIS — E1069 Type 1 diabetes mellitus with other specified complication: Secondary | ICD-10-CM | POA: Diagnosis not present

## 2022-01-28 DIAGNOSIS — H04123 Dry eye syndrome of bilateral lacrimal glands: Secondary | ICD-10-CM | POA: Diagnosis not present

## 2022-01-28 DIAGNOSIS — I152 Hypertension secondary to endocrine disorders: Secondary | ICD-10-CM | POA: Diagnosis not present

## 2022-01-28 DIAGNOSIS — E1043 Type 1 diabetes mellitus with diabetic autonomic (poly)neuropathy: Secondary | ICD-10-CM | POA: Diagnosis not present

## 2022-01-28 DIAGNOSIS — E785 Hyperlipidemia, unspecified: Secondary | ICD-10-CM | POA: Diagnosis not present

## 2022-01-28 DIAGNOSIS — Z9181 History of falling: Secondary | ICD-10-CM | POA: Diagnosis not present

## 2022-01-28 DIAGNOSIS — I5022 Chronic systolic (congestive) heart failure: Secondary | ICD-10-CM | POA: Diagnosis not present

## 2022-01-28 DIAGNOSIS — E1051 Type 1 diabetes mellitus with diabetic peripheral angiopathy without gangrene: Secondary | ICD-10-CM | POA: Diagnosis not present

## 2022-01-28 DIAGNOSIS — I2510 Atherosclerotic heart disease of native coronary artery without angina pectoris: Secondary | ICD-10-CM | POA: Diagnosis not present

## 2022-01-28 DIAGNOSIS — I130 Hypertensive heart and chronic kidney disease with heart failure and stage 1 through stage 4 chronic kidney disease, or unspecified chronic kidney disease: Secondary | ICD-10-CM | POA: Diagnosis not present

## 2022-01-28 DIAGNOSIS — E103519 Type 1 diabetes mellitus with proliferative diabetic retinopathy with macular edema, unspecified eye: Secondary | ICD-10-CM | POA: Diagnosis not present

## 2022-01-28 DIAGNOSIS — E039 Hypothyroidism, unspecified: Secondary | ICD-10-CM | POA: Diagnosis not present

## 2022-01-28 DIAGNOSIS — Z95828 Presence of other vascular implants and grafts: Secondary | ICD-10-CM | POA: Diagnosis not present

## 2022-01-28 DIAGNOSIS — J439 Emphysema, unspecified: Secondary | ICD-10-CM | POA: Diagnosis not present

## 2022-01-28 DIAGNOSIS — K5900 Constipation, unspecified: Secondary | ICD-10-CM | POA: Diagnosis not present

## 2022-01-28 DIAGNOSIS — E1022 Type 1 diabetes mellitus with diabetic chronic kidney disease: Secondary | ICD-10-CM | POA: Diagnosis not present

## 2022-01-28 DIAGNOSIS — Z794 Long term (current) use of insulin: Secondary | ICD-10-CM | POA: Diagnosis not present

## 2022-01-28 DIAGNOSIS — D509 Iron deficiency anemia, unspecified: Secondary | ICD-10-CM | POA: Diagnosis not present

## 2022-01-28 DIAGNOSIS — K3184 Gastroparesis: Secondary | ICD-10-CM | POA: Diagnosis not present

## 2022-01-28 DIAGNOSIS — F32A Depression, unspecified: Secondary | ICD-10-CM | POA: Diagnosis not present

## 2022-01-30 ENCOUNTER — Ambulatory Visit (INDEPENDENT_AMBULATORY_CARE_PROVIDER_SITE_OTHER): Payer: Medicare Other | Admitting: *Deleted

## 2022-01-30 DIAGNOSIS — I5022 Chronic systolic (congestive) heart failure: Secondary | ICD-10-CM

## 2022-01-30 DIAGNOSIS — E1151 Type 2 diabetes mellitus with diabetic peripheral angiopathy without gangrene: Secondary | ICD-10-CM

## 2022-01-30 NOTE — Patient Instructions (Addendum)
Visit Information ? ?Thank you for taking time to visit with me today. Please don't hesitate to contact me if I can be of assistance to you before our next scheduled telephone appointment. ? ?Following are the goals we discussed today:  ?Check blood sugar at least 5 times a day ?Check blood sugar if I feel it is too high or too low ?Take the blood sugar meter and log to all doctor visits  ?Consider drinking Boost or other supplement in the evenings if not eating a complete meal ?Make sure to have candy or glucose tablets on you at all times; try drinking coke along with eating snack to treat hypoglycemia ?Eat at least 3 meals a day with snack in between to help prevent hypoglycemia ?Fall precautions and preventions; Wear life alert at all times ?Contact Endocrinology for hypoglycemic episodes ?Weigh self daily using walker to balance; request help when putting new batteries in scale ?Low salt carb modified diet ?Schedule appointment with PCP ASAP ? ?Our next appointment is by telephone on 5/22 at 1030 ? ?Please call the care guide team at [phone] if you need to cancel or reschedule your appointment.  ? ?If you are experiencing a Mental Health or [place] or need someone to talk to, please call the Suicide and Crisis Lifeline: 988 ?call the [place] National Suicide Prevention Lifeline: 620-554-5203 or TTY: 804-407-1477 TTY [phone]) to talk to a trained counselor ?call 1-800-273-TALK (toll free, 24 hour hotline) ?call 911  ? ?Patient verbalizes understanding of instructions and care plan provided today and agrees to view in [place]. Active MyChart status confirmed with patient.   ? ?[name] RN, MSN ?RN Care Management Coordinator ?[place] ?[phone] ?[email or web address] ? ?

## 2022-01-30 NOTE — Chronic Care Management (AMB) (Signed)
?Chronic Care Management  ? ?CCM RN Visit Note ? ?01/30/2022 ?Name: Gary [name] MRN: [id number] DOB: 1946-04-01 ? ?Subjective: ?Gary [name] is a 76 y.o. year old male who is a primary care patient of [name], MD. The care management team was consulted for assistance with disease management and care coordination needs.   ? ?Engaged with patient by telephone for follow up visit in response to provider referral for case management and/or care coordination services.  ? ?Consent to Services:  ?The patient was given information about Chronic Care Management services, agreed to services, and gave verbal consent prior to initiation of services.  Please see initial visit note for detailed documentation.  ? ?Patient agreed to services and verbal consent obtained.  ? ?Assessment: Review of patient past medical history, allergies, medications, health status, including review of consultants reports, laboratory and other test data, was performed as part of comprehensive evaluation and provision of chronic care management services.  ? ?SDOH (Social Determinants of Health) assessments and interventions performed:   ? ?CCM Care Plan ? ?Allergies  ?Allergen Reactions  ? Rofecoxib Nausea Only  ? ? ?Outpatient Encounter Medications as of 01/30/2022  ?Medication Sig  ? furosemide (LASIX) 40 MG tablet Take 1 tablet (40 mg total) by mouth daily. (Patient taking differently: Take 80 mg by mouth 2 (two) times daily. Patient taking 80 MG in the AM and 40 mg in the PM.)  ? acetaminophen (TYLENOL) 325 MG tablet Take 650 mg by mouth every 6 (six) hours as needed. (Patient not taking: Reported on 12/30/2021)  ? bacitracin 500 UNIT/GM ointment Apply 1 application topically daily.  ? Cholecalciferol (VITAMIN D3) 50 MCG (2000 UT) CAPS Take 2,000 Units by mouth daily.  ? feeding supplement, GLUCERNA SHAKE, (GLUCERNA SHAKE) LIQD Take 237 mLs by mouth 2 (two) times daily between meals. (Patient not taking: Reported on 01/26/2022)  ? ferrous  sulfate 325 (65 FE) MG tablet Take 325 mg by mouth daily with breakfast.  ? FLUoxetine (PROZAC) 20 MG capsule Take 1 capsule (20 mg total) by mouth daily.  ? glucose 4 GM chewable tablet Chew 1 tablet (4 g total) by mouth once as needed for low blood sugar.  ? insulin aspart (NOVOLOG) 100 UNIT/ML injection Inject 6 Units into the skin 3 (three) times daily with meals. (Patient taking differently: Inject 80-100 Units into the skin once. INSULIN PUMP :Patient counts carb intake and calculates dose, with meals night time if CBG is less than 200 patient holds insulin. Patient taking 80-100 units daily.)  ? levothyroxine (SYNTHROID) 25 MCG tablet Take 25 mcg by mouth daily before breakfast.  ? potassium chloride (KLOR-CON) 10 MEQ tablet TAKE 1 TABLET BY MOUTH 2 TIMES DAILY.  ? pravastatin (PRAVACHOL) 40 MG tablet Take 40 mg by mouth daily.   ? vitamin B-12 (CYANOCOBALAMIN) 1000 MCG tablet Take 1,000 mcg by mouth daily.  ? ?Facility-Administered Encounter Medications as of 01/30/2022  ?Medication  ? heparin lock flush 100 unit/mL  ? sodium chloride flush (NS) 0.9 % injection 10 mL  ? sodium chloride flush (NS) 0.9 % injection 10 mL  ? ? ?Patient Active Problem List  ? Diagnosis Date Noted  ? Recurrent falls 08/29/2021  ? 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 Left rib fracture_7th rib   ? Leukocytosis   ? HTN (hypertension)   ? Hypothyroid   ? CAD (coronary artery disease)   ? Porokeratosis 05/15/2021  ? Aortic atherosclerosis ([place]) 05/05/2021  ? Emphysema lung (  [place]) 05/05/2021  ? Thrombocytopenia ([place]) 04/29/2021  ? Splinter of foot without infection 02/23/2021  ? Arm laceration 02/09/2021  ? Hypokalemia 10/24/2020  ? Injury by nail 08/15/2020  ? Shingles 07/24/2020  ? Rash 07/02/2020  ? Colon cancer screening 07/02/2020  ? Sick sinus syndrome ([place]) 12/28/2019  ? Acquired absence of kidney 09/13/2019  ? Edema of lower extremity 09/13/2019  ? Malignant hypertensive kidney disease with chronic kidney disease  stage I through stage IV, or unspecified 09/13/2019  ? Proteinuria 09/13/2019  ? Lower extremity pain, bilateral   ? Diabetic foot ulcer associated with type 1 diabetes mellitus ([place])   ? Acute on chronic systolic CHF (congestive heart failure) ([place])   ? Cellulitis 08/18/2019  ? Lower limb ulcer, calf ([place]) 08/01/2019  ? Left hip pain 07/16/2019  ? Pain due to onychomycosis of toenails of both feet 04/03/2019  ? Swelling of limb 02/07/2019  ? CKD (chronic kidney disease) stage 3, GFR 30-59 ml/min (HCC) 09/16/2018  ? Left shoulder pain 09/16/2018  ? Chronic cholecystitis 12/29/2017  ? Graves disease 12/29/2017  ? Peripheral neuropathy 12/29/2017  ? S/p nephrectomy 12/29/2017  ? 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 Symptomatic bradycardia 12/02/2017  ? CKD (chronic kidney disease), stage IIIa 12/02/2017  ? Chronic systolic CHF (congestive heart failure) ([place]) 12/02/2017  ? Saturday night paralysis 11/12/2017  ? Hypoglycemia 10/28/2017  ? Goals of care, counseling/discussion 10/24/2017  ? Lymphedema 10/20/2017  ? Iron deficiency anemia 09/11/2017  ? Fall 08/13/2017  ? Humerus fracture 08/13/2017  ? Anemia 01/12/2017  ? Bilateral carotid artery stenosis 06/29/2016  ? Hand laceration 12/16/2015  ? Adjustment disorder with depressed mood 08/11/2015  ? Cardiomyopathy, idiopathic ([place]) 04/22/2015  ? CAD in native artery 11/02/2014  ? Mantle cell lymphoma ([place]) 08/03/2014  ? History of colonic polyps 04/29/2014  ? Irregular heart beat 04/22/2014  ? SOB (shortness of breath) 04/22/2014  ? Stress 03/21/2014  ? PVC (premature ventricular contraction) 02/21/2014  ? GERD (gastroesophageal reflux disease) 10/23/2013  ? Gastroparesis 06/25/2013  ? Chest pain 04/13/2013  ? Thyroid disease 04/13/2013  ? Diabetes ([place]) 04/13/2013  ? Essential hypertension, benign 04/13/2013  ? Hypercholesterolemia 04/13/2013  ? ? ?Conditions to be addressed/monitored:CHF and DMII ? ?Care Plan : RNCM  Diabetes  ?Updates made by Leona Singleton, RN  since 01/30/2022 12:00 AM  ?  ? ?Problem: Hypoglycemia causing syncopal episodes   ?Priority: Medium  ?  ? ?Long-Range Goal: Patient will report maintaing Hgb A1C of 7 or below in the next 90 days   ?Start Date: 01/27/2021  ?Expected End Date: 02/08/2022  ?Recent Progress: Not on track  ?Priority: Medium  ?Note:   ?Objective:  ?Lab Results  ?Component Value Date  ? HGBA1C 7.0 01/23/2021  ?Current Barriers:  ?Knowledge Deficits related to basic Diabetes pathophysiology and self care/management as evidenced by multiple syncopal episodes related to hypoglycemia.  ?Limited Social Support   ?Trouble getting insulin pump supplies-reports he now has insulin pump sensors ? Latest Hgb A1C 7.4 on 01/28/22.   ?2/13--Spoke with patient for only a few minutes as home health therapy has just arrived.  Patient reports being tired but admits waking early to low blood sugar alarm in 50's this morning.  States it is back up to normal now,  Still has not spoken with Endocrinology and declines my offer of assistance to contact and schedule follow up.  Denies fall but adits to some stumbles.  Still needs to get someone to replace scale batteries ? ?  2/27--States he feels pretty good.  Fasting blood sugar this morning was 86, lowest recently was 60's and average of 150's.  Has been using/eating donuts  to treat hypoglycemia.Marland Kitchen  RNCM still encouraging patient to contact Endocrinology.  Has new scale but not weighed today.  BP 135/852 ? ?3/22--  REPORTS DOING FAIR.  NO INCREASE IN SOB, HOME HEALTH CONTINUES TO DO UNNA BOOTS WEEKLY.  HAS NOT SEEN OR SPOKEN TO ENDOCRINOLOGY.  REPORTS A FEW HYPOGLYCEMIC EVENTS WITH RATES IN 60'S.  FASTING THIS MORNING WAS 86 WITH RECENT RATES 80-150'S.  BP 135/85.  PATIENT REPORTS TRYING TO ARRANGE TRANSPORTATION TO ENDOCRINOLOGY APPOINTMENT AT [facility]. ? ?4/21--States he is doing well.  Attended appointment with Endocrinology with A1C of 7.4, no changes to insulin pump.  Fasting CBG this morning was 88, recent ranges  were 58-400.  Weight this morning 219.6.  Denies chest pain, SOB, and states lower extremity edema is a little better.  Home health nurse continue to change unna boots.  Does acknowledge transportation co

## 2022-02-06 DIAGNOSIS — N1831 Chronic kidney disease, stage 3a: Secondary | ICD-10-CM | POA: Diagnosis not present

## 2022-02-06 DIAGNOSIS — E1022 Type 1 diabetes mellitus with diabetic chronic kidney disease: Secondary | ICD-10-CM | POA: Diagnosis not present

## 2022-02-06 DIAGNOSIS — I130 Hypertensive heart and chronic kidney disease with heart failure and stage 1 through stage 4 chronic kidney disease, or unspecified chronic kidney disease: Secondary | ICD-10-CM | POA: Diagnosis not present

## 2022-02-06 DIAGNOSIS — I5022 Chronic systolic (congestive) heart failure: Secondary | ICD-10-CM | POA: Diagnosis not present

## 2022-02-06 DIAGNOSIS — I890 Lymphedema, not elsewhere classified: Secondary | ICD-10-CM | POA: Diagnosis not present

## 2022-02-06 DIAGNOSIS — E1051 Type 1 diabetes mellitus with diabetic peripheral angiopathy without gangrene: Secondary | ICD-10-CM | POA: Diagnosis not present

## 2022-02-06 NOTE — Telephone Encounter (Signed)
[facility] called about update of outstanding orders ?

## 2022-02-08 DIAGNOSIS — E1151 Type 2 diabetes mellitus with diabetic peripheral angiopathy without gangrene: Secondary | ICD-10-CM | POA: Diagnosis not present

## 2022-02-08 DIAGNOSIS — I5022 Chronic systolic (congestive) heart failure: Secondary | ICD-10-CM

## 2022-02-09 NOTE — Telephone Encounter (Signed)
Lm for crystal @ [facility] - we faxed in multiple orders for pt ?

## 2022-02-10 ENCOUNTER — Encounter (INDEPENDENT_AMBULATORY_CARE_PROVIDER_SITE_OTHER): Payer: Self-pay | Admitting: Nurse Practitioner

## 2022-02-10 ENCOUNTER — Ambulatory Visit (INDEPENDENT_AMBULATORY_CARE_PROVIDER_SITE_OTHER): Payer: Medicare Other | Admitting: Nurse Practitioner

## 2022-02-10 VITALS — BP 150/80 | HR 93 | Resp 16 | Wt 236.0 lb

## 2022-02-10 DIAGNOSIS — I10 Essential (primary) hypertension: Secondary | ICD-10-CM | POA: Diagnosis not present

## 2022-02-10 DIAGNOSIS — I890 Lymphedema, not elsewhere classified: Secondary | ICD-10-CM | POA: Diagnosis not present

## 2022-02-13 DIAGNOSIS — I890 Lymphedema, not elsewhere classified: Secondary | ICD-10-CM | POA: Diagnosis not present

## 2022-02-13 DIAGNOSIS — I5022 Chronic systolic (congestive) heart failure: Secondary | ICD-10-CM | POA: Diagnosis not present

## 2022-02-13 DIAGNOSIS — E1051 Type 1 diabetes mellitus with diabetic peripheral angiopathy without gangrene: Secondary | ICD-10-CM | POA: Diagnosis not present

## 2022-02-13 DIAGNOSIS — I130 Hypertensive heart and chronic kidney disease with heart failure and stage 1 through stage 4 chronic kidney disease, or unspecified chronic kidney disease: Secondary | ICD-10-CM | POA: Diagnosis not present

## 2022-02-13 DIAGNOSIS — E1022 Type 1 diabetes mellitus with diabetic chronic kidney disease: Secondary | ICD-10-CM | POA: Diagnosis not present

## 2022-02-13 DIAGNOSIS — N1831 Chronic kidney disease, stage 3a: Secondary | ICD-10-CM | POA: Diagnosis not present

## 2022-02-22 ENCOUNTER — Encounter (INDEPENDENT_AMBULATORY_CARE_PROVIDER_SITE_OTHER): Payer: Self-pay | Admitting: Nurse Practitioner

## 2022-02-22 NOTE — Progress Notes (Signed)
? ?Subjective:  ? ? [name], male    DOB: 1945-10-15, 76 y.o.   MRN: [id number] ?Chief Complaint  ?Patient presents with  ? Follow-up  ?  6 week unna wrap  ? ? ?Gary [name] is a 76 year old male who presents today for follow-up evaluation of lower extremity edema following Unna wraps done by home health.  The patient is tolerating the wraps well.  There is some small open wounds but overall they are doing well. ? ? ?Review of Systems  ?Cardiovascular:  Positive for leg swelling.  ?All other systems reviewed and are negative. ? ?   ?Objective:  ? Physical Exam ?Vitals reviewed.  ?HENT:  ?   Head: Normocephalic.  ?Cardiovascular:  ?   Rate and Rhythm: Normal rate.  ?Pulmonary:  ?   Effort: Pulmonary effort is normal.  ?Musculoskeletal:  ?   Right lower leg: Edema present.  ?   Left lower leg: Edema present.  ?Skin: ?   General: Skin is warm and dry.  ?Neurological:  ?   Mental Status: He is alert and oriented to person, place, and time.  ?Psychiatric:     ?   Mood and Affect: Mood normal.     ?   Behavior: Behavior normal.     ?   Thought Content: Thought content normal.     ?   Judgment: Judgment normal.  ? ? ?BP (!) 150/80 (BP Location: Right Arm)   Pulse 93   Resp 16   Wt 236 lb (107 kg)   BMI 36.96 kg/m?  ? ?Past Medical History:  ?Diagnosis Date  ? CHF (congestive heart failure) ([place])   ? Depression   ? 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 Gastroparesis   ? Graves disease   ? Hypercholesterolemia   ? Hypertension   ? Mantle cell lymphoma ([place]) 07/2014  ? Peripheral neuropathy   ? Shingles   ? ? ?Social History  ? ?Socioeconomic History  ? Marital status: Widowed  ?  Spouse name: Not on file  ? Number of children: 1  ? Years of education: 60  ? Highest education level: 12th grade  ?Occupational History  ? Occupation: Retired  ?Tobacco Use  ? Smoking status: Former  ? Smokeless tobacco: Current  ?  Types: Chew  ?Vaping Use  ? Vaping Use:  Never used  ?Substance and Sexual Activity  ? Alcohol use: Yes  ?  Alcohol/week: 3.0 standard drinks  ?  Types: 3 Cans of beer per week  ?  Comment: nightly  ? Drug use: Never  ? Sexual activity: Not Currently  ?Other Topics Concern  ? Not on file  ?Social History Narrative  ? Not on file  ? ?Social Determinants of Health  ? ?Financial Resource Strain: Low Risk   ? Difficulty of Paying Living Expenses: Not hard at all  ?Food Insecurity: No Food Insecurity  ? Worried About Charity fundraiser in the Last Year: Never true  ? Ran Out of Food in the Last Year: Never true  ?Transportation Needs: Unmet Transportation Needs  ? Lack of Transportation (Medical): Yes  ? Lack of Transportation (Non-Medical): Yes  ?Physical Activity: Not on file  ?Stress: Not on file  ?Social Connections: Socially Isolated  ? Frequency of Communication with Friends and Family: More than three times a week  ? Frequency of Social Gatherings with Friends and Family: Twice a week  ? Attends  Religious Services: Never  ? Active Member of Clubs or Organizations: No  ? Attends Archivist Meetings: Never  ? Marital Status: Widowed  ?Intimate Partner Violence: Not At Risk  ? Fear of Current or Ex-Partner: No  ? Emotionally Abused: No  ? Physically Abused: No  ? Sexually Abused: No  ? ? ?Past Surgical History:  ?Procedure Laterality Date  ? CHOLECYSTECTOMY    ? NEPHRECTOMY    ? right  ? PACEMAKER INSERTION N/A 12/07/2017  ? Procedure: INSERTION PACEMAKER DUAL CHAMBER INITIAL INSERT;  Surgeon: Isaias Cowman, MD;  Location: [facility] ORS;  Service: Cardiovascular;  Laterality: N/A;  ? PORTA CATH INSERTION N/A 11/03/2017  ? Procedure: PORTA CATH INSERTION;  Surgeon: Katha Cabal, MD;  Location: [facility];  Service: Cardiovascular;  Laterality: N/A;  ? ? ?Family History  ?Problem Relation Age of Onset  ? Breast cancer Mother   ? Diabetes Father   ? Cancer Father   ?     Lymphoma  ? Alcohol abuse Maternal Uncle   ? Diabetes Sister    ? Heart disease Other   ?     grandfather  ? ? ?Allergies  ?Allergen Reactions  ? Rofecoxib Nausea Only  ? ? ? ?  Latest Ref Rng & Units 10/17/2021  ?  4:24 PM 09/13/2021  ?  6:08 AM 09/07/2021  ?  5:04 AM  ?CBC  ?WBC 3.8 - 10.8 Thousand/uL 5.2   2.8   2.9    ?Hemoglobin 13.2 - 17.1 g/dL 13.1   13.5   14.2    ?Hematocrit 38.5 - 50.0 % 39.5   40.8   42.9    ?Platelets 140 - 400 Thousand/uL 149   122   103    ? ? ? ? ?CMP  ?   ?Component Value Date/Time  ? NA 139 12/17/2021 1041  ? NA 138 07/15/2014 0515  ? K 3.9 12/17/2021 1041  ? K 3.5 01/16/2015 1429  ? CL 101 12/17/2021 1041  ? CL 110 (H) 07/15/2014 0515  ? CO2 28 12/17/2021 1041  ? CO2 24 07/15/2014 0515  ? GLUCOSE 132 (H) 12/17/2021 1041  ? GLUCOSE 145 (H) 07/15/2014 0515  ? BUN 30 (H) 12/17/2021 1041  ? BUN 17 07/15/2014 0515  ? CREATININE 1.36 12/17/2021 1041  ? CREATININE 1.38 (H) 10/17/2021 1624  ? CALCIUM 8.4 12/17/2021 1041  ? CALCIUM 7.1 (L) 07/15/2014 0515  ? PROT 6.7 01/19/2022 1059  ? PROT 6.2 (L) 10/22/2014 1000  ? ALBUMIN 4.2 01/19/2022 1059  ? ALBUMIN 2.6 (L) 10/22/2014 1000  ? AST 14 01/19/2022 1059  ? AST 19 10/22/2014 1000  ? ALT 8 01/19/2022 1059  ? ALT 21 10/22/2014 1000  ? ALKPHOS 126 (H) 01/19/2022 1059  ? ALKPHOS 118 (H) 10/22/2014 1000  ? BILITOT 0.5 01/19/2022 1059  ? BILITOT 0.8 10/22/2014 1000  ? GFRNONAA 48 (L) 09/13/2021 3151  ? GFRNONAA >60 01/16/2015 1429  ? GFRAA 41 (L) 01/24/2020 0925  ? GFRAA >60 01/16/2015 1429  ? ? ? ?No results found. ? ?   ?Assessment & Plan:  ? ?1. Lymphedema ?Leg swelling is much improved however there are some small open areas.  Patient will continue with the wraps as done by home health. ? ?2. Primary hypertension ?Continue antihypertensive medications as already ordered, these medications have been reviewed and there are no changes at this time.  ? ? ?Current Outpatient Medications on File Prior to Visit  ?Medication Sig Dispense Refill  ?  bacitracin 500 UNIT/GM ointment Apply 1 application topically daily.     ? Cholecalciferol (VITAMIN D3) 50 MCG (2000 UT) CAPS Take 2,000 Units by mouth daily.    ? ferrous sulfate 325 (65 FE) MG tablet Take 325 mg by mouth daily with breakfast.    ? FLUoxetine (PROZAC) 20 MG capsule Take 1 capsule (20 mg total) by mouth daily. 90 capsule 3  ? furosemide (LASIX) 40 MG tablet Take 1 tablet (40 mg total) by mouth daily. (Patient taking differently: Take 80 mg by mouth 2 (two) times daily. Patient taking 80 MG in the AM and 40 mg in the PM.) 30 tablet   ? glucose 4 GM chewable tablet Chew 1 tablet (4 g total) by mouth once as needed for low blood sugar. 50 tablet 0  ? insulin aspart (NOVOLOG) 100 UNIT/ML injection Inject 6 Units into the skin 3 (three) times daily with meals. (Patient taking differently: Inject 80-100 Units into the skin once. INSULIN PUMP :Patient counts carb intake and calculates dose, with meals night time if CBG is less than 200 patient holds insulin. Patient taking 80-100 units daily.)    ? levothyroxine (SYNTHROID) 25 MCG tablet Take 25 mcg by mouth daily before breakfast.    ? potassium chloride (KLOR-CON) 10 MEQ tablet TAKE 1 TABLET BY MOUTH 2 TIMES DAILY. 60 tablet 0  ? pravastatin (PRAVACHOL) 40 MG tablet Take 40 mg by mouth daily.     ? vitamin B-12 (CYANOCOBALAMIN) 1000 MCG tablet Take 1,000 mcg by mouth daily.    ? acetaminophen (TYLENOL) 325 MG tablet Take 650 mg by mouth every 6 (six) hours as needed. (Patient not taking: Reported on 12/30/2021)    ? feeding supplement, GLUCERNA SHAKE, (GLUCERNA SHAKE) LIQD Take 237 mLs by mouth 2 (two) times daily between meals. (Patient not taking: Reported on 01/26/2022) 237 mL 0  ? ?Current Facility-Administered Medications on File Prior to Visit  ?Medication Dose Route Frequency Provider Last Rate Last Admin  ? heparin lock flush 100 unit/mL  500 Units Intravenous Once Lloyd Huger, MD      ? sodium chloride flush (NS) 0.9 % injection 10 mL  10 mL Intravenous PRN Lloyd Huger, MD   10 mL at 02/01/18 0829  ?  sodium chloride flush (NS) 0.9 % injection 10 mL  10 mL Intravenous PRN Lloyd Huger, MD   10 mL at 04/05/18 0800  ? ? ?There are no Patient Instructions on file for this visit. ?No follow-ups on f

## 2022-02-26 ENCOUNTER — Ambulatory Visit (INDEPENDENT_AMBULATORY_CARE_PROVIDER_SITE_OTHER): Payer: Medicare Other | Admitting: Podiatry

## 2022-02-26 ENCOUNTER — Encounter: Payer: Self-pay | Admitting: Podiatry

## 2022-02-26 DIAGNOSIS — N179 Acute kidney failure, unspecified: Secondary | ICD-10-CM | POA: Diagnosis not present

## 2022-02-26 DIAGNOSIS — M79675 Pain in left toe(s): Secondary | ICD-10-CM

## 2022-02-26 DIAGNOSIS — B351 Tinea unguium: Secondary | ICD-10-CM

## 2022-02-26 DIAGNOSIS — E1151 Type 2 diabetes mellitus with diabetic peripheral angiopathy without gangrene: Secondary | ICD-10-CM | POA: Diagnosis not present

## 2022-02-26 DIAGNOSIS — M79674 Pain in right toe(s): Secondary | ICD-10-CM

## 2022-02-26 NOTE — Progress Notes (Signed)
This patient returns to my office for at risk foot care.  This patient requires this care by a professional since this patient will be at risk due to having chronic kidney disease and diabetes.  This patient is unable to cut nails himself since the patient cannot reach his nails.These nails are painful walking and wearing shoes. This patient is wearing unna boots on both legs/feet. This patient presents for at risk foot care today.  General Appearance  Alert, conversant and in no acute stress.  Vascular  Deferred due to unna boots.  Neurologic  Deferred due to unna boots.  Nails Thick disfigured discolored nails with subungual debris  from hallux to fifth toes bilaterally. No evidence of bacterial infection or drainage bilaterally.   Orthopedic  No limitations of motion  feet .  No crepitus or effusions noted.  No bony pathology or digital deformities noted.  Skin  normotropic skin with no porokeratosis noted bilaterally.  No signs of infections or ulcers noted.     Onychomycosis  Pain in right toes  Pain in left toes  Consent was obtained for treatment procedures.   Mechanical debridement of nails 1-5  bilaterally performed with a nail nipper.  Filed with dremel without incident.    Return office visit    3 months                 Told patient to return for periodic foot care and evaluation due to potential at risk complications.   [name] DPM

## 2022-02-27 DIAGNOSIS — I130 Hypertensive heart and chronic kidney disease with heart failure and stage 1 through stage 4 chronic kidney disease, or unspecified chronic kidney disease: Secondary | ICD-10-CM | POA: Diagnosis not present

## 2022-02-27 DIAGNOSIS — Z95828 Presence of other vascular implants and grafts: Secondary | ICD-10-CM | POA: Diagnosis not present

## 2022-02-27 DIAGNOSIS — N1831 Chronic kidney disease, stage 3a: Secondary | ICD-10-CM | POA: Diagnosis not present

## 2022-02-27 DIAGNOSIS — I5022 Chronic systolic (congestive) heart failure: Secondary | ICD-10-CM | POA: Diagnosis not present

## 2022-02-27 DIAGNOSIS — K5900 Constipation, unspecified: Secondary | ICD-10-CM | POA: Diagnosis not present

## 2022-02-27 DIAGNOSIS — E039 Hypothyroidism, unspecified: Secondary | ICD-10-CM | POA: Diagnosis not present

## 2022-02-27 DIAGNOSIS — H04123 Dry eye syndrome of bilateral lacrimal glands: Secondary | ICD-10-CM | POA: Diagnosis not present

## 2022-02-27 DIAGNOSIS — I2510 Atherosclerotic heart disease of native coronary artery without angina pectoris: Secondary | ICD-10-CM | POA: Diagnosis not present

## 2022-02-27 DIAGNOSIS — J439 Emphysema, unspecified: Secondary | ICD-10-CM | POA: Diagnosis not present

## 2022-02-27 DIAGNOSIS — K3184 Gastroparesis: Secondary | ICD-10-CM | POA: Diagnosis not present

## 2022-02-27 DIAGNOSIS — D509 Iron deficiency anemia, unspecified: Secondary | ICD-10-CM | POA: Diagnosis not present

## 2022-02-27 DIAGNOSIS — Z9181 History of falling: Secondary | ICD-10-CM | POA: Diagnosis not present

## 2022-02-27 DIAGNOSIS — I890 Lymphedema, not elsewhere classified: Secondary | ICD-10-CM | POA: Diagnosis not present

## 2022-02-27 DIAGNOSIS — Z950 Presence of cardiac pacemaker: Secondary | ICD-10-CM | POA: Diagnosis not present

## 2022-02-27 DIAGNOSIS — E785 Hyperlipidemia, unspecified: Secondary | ICD-10-CM | POA: Diagnosis not present

## 2022-02-27 DIAGNOSIS — Z8572 Personal history of non-Hodgkin lymphomas: Secondary | ICD-10-CM | POA: Diagnosis not present

## 2022-02-27 DIAGNOSIS — Z794 Long term (current) use of insulin: Secondary | ICD-10-CM | POA: Diagnosis not present

## 2022-02-27 DIAGNOSIS — E1043 Type 1 diabetes mellitus with diabetic autonomic (poly)neuropathy: Secondary | ICD-10-CM | POA: Diagnosis not present

## 2022-02-27 DIAGNOSIS — E1051 Type 1 diabetes mellitus with diabetic peripheral angiopathy without gangrene: Secondary | ICD-10-CM | POA: Diagnosis not present

## 2022-02-27 DIAGNOSIS — I872 Venous insufficiency (chronic) (peripheral): Secondary | ICD-10-CM | POA: Diagnosis not present

## 2022-02-27 DIAGNOSIS — E103519 Type 1 diabetes mellitus with proliferative diabetic retinopathy with macular edema, unspecified eye: Secondary | ICD-10-CM | POA: Diagnosis not present

## 2022-02-27 DIAGNOSIS — F32A Depression, unspecified: Secondary | ICD-10-CM | POA: Diagnosis not present

## 2022-02-27 DIAGNOSIS — E1022 Type 1 diabetes mellitus with diabetic chronic kidney disease: Secondary | ICD-10-CM | POA: Diagnosis not present

## 2022-03-02 ENCOUNTER — Ambulatory Visit (INDEPENDENT_AMBULATORY_CARE_PROVIDER_SITE_OTHER): Payer: Medicare Other | Admitting: *Deleted

## 2022-03-02 ENCOUNTER — Other Ambulatory Visit: Payer: Self-pay | Admitting: Internal Medicine

## 2022-03-02 DIAGNOSIS — I5022 Chronic systolic (congestive) heart failure: Secondary | ICD-10-CM

## 2022-03-02 DIAGNOSIS — E1151 Type 2 diabetes mellitus with diabetic peripheral angiopathy without gangrene: Secondary | ICD-10-CM

## 2022-03-02 NOTE — Patient Instructions (Addendum)
Visit Information  Thank you for taking time to visit with me today. Please don't hesitate to contact me if I can be of assistance to you before our next scheduled telephone appointment.  Following are the goals we discussed today:  Check blood sugar at least 5 times a day Check blood sugar if I feel it is too high or too low Take the blood sugar meter and log to all doctor visits  Consider drinking Boost or other supplement in the evenings if not eating a complete meal Make sure to have candy or glucose tablets on you at all times; try drinking coke along with eating snack to treat hypoglycemia Eat at least 3 meals a day with snack in between to help prevent hypoglycemia Fall precautions and preventions; Wear life alert at all times Contact Endocrinology for SUSTAINED hypoglycemic episodes Weigh self daily using walker to balance; request help when putting new batteries in scale Low salt carb modified diet Schedule appointment with PCP ASAP  Our next appointment is by telephone on 6/16 at 1100  Please call the care guide team at [phone] if you need to cancel or reschedule your appointment.   If you are experiencing a Mental Health or [place] or need someone to talk to, please call the Suicide and Crisis Lifeline: 988 call the [place] National Suicide Prevention Lifeline: 531-099-8601 or TTY: 615-186-0468 TTY (612)159-9143) to talk to a trained counselor call 1-800-273-TALK (toll free, 24 hour hotline) call 911   Patient verbalizes understanding of instructions and care plan provided today and agrees to view in [place]. Active MyChart status and patient understanding of how to access instructions and care plan via MyChart confirmed with patient.     [name] RN, MSN RN Care Management Coordinator [place] [phone] [email or web address]

## 2022-03-02 NOTE — Chronic Care Management (AMB) (Signed)
Chronic Care Management   CCM RN Visit Note  03/02/2022 Name: Gary [name] MRN: [id number] DOB: May 17, 1946  Subjective: Gary [name] is a 76 y.o. year old male who is a primary care patient of [name], MD. The care management team was consulted for assistance with disease management and care coordination needs.    Engaged with patient by telephone for follow up visit in response to provider referral for case management and/or care coordination services.   Consent to Services:  The patient was given information about Chronic Care Management services, agreed to services, and gave verbal consent prior to initiation of services.  Please see initial visit note for detailed documentation.   Patient agreed to services and verbal consent obtained.   Assessment: Review of patient past medical history, allergies, medications, health status, including review of consultants reports, laboratory and other test data, was performed as part of comprehensive evaluation and provision of chronic care management services.   SDOH (Social Determinants of Health) assessments and interventions performed:    CCM Care Plan  Allergies  Allergen Reactions   Rofecoxib Nausea Only    Outpatient Encounter Medications as of 03/02/2022  Medication Sig   acetaminophen (TYLENOL) 325 MG tablet Take 650 mg by mouth every 6 (six) hours as needed. (Patient not taking: Reported on 12/30/2021)   bacitracin 500 UNIT/GM ointment Apply 1 application topically daily.   Cholecalciferol (VITAMIN D3) 50 MCG (2000 UT) CAPS Take 2,000 Units by mouth daily.   feeding supplement, GLUCERNA SHAKE, (GLUCERNA SHAKE) LIQD Take 237 mLs by mouth 2 (two) times daily between meals. (Patient not taking: Reported on 01/26/2022)   ferrous sulfate 325 (65 FE) MG tablet Take 325 mg by mouth daily with breakfast.   FLUoxetine (PROZAC) 20 MG capsule Take 1 capsule (20 mg total) by mouth daily.   furosemide (LASIX) 40 MG tablet Take 1 tablet (40  mg total) by mouth daily. (Patient taking differently: Take 80 mg by mouth 2 (two) times daily. Patient taking 80 MG in the AM and 40 mg in the PM.)   glucose 4 GM chewable tablet Chew 1 tablet (4 g total) by mouth once as needed for low blood sugar.   insulin aspart (NOVOLOG) 100 UNIT/ML injection Inject 6 Units into the skin 3 (three) times daily with meals. (Patient taking differently: Inject 80-100 Units into the skin once. INSULIN PUMP :Patient counts carb intake and calculates dose, with meals night time if CBG is less than 200 patient holds insulin. Patient taking 80-100 units daily.)   levothyroxine (SYNTHROID) 25 MCG tablet Take 25 mcg by mouth daily before breakfast.   potassium chloride (KLOR-CON) 10 MEQ tablet TAKE 1 TABLET BY MOUTH 2 TIMES DAILY.   pravastatin (PRAVACHOL) 40 MG tablet Take 40 mg by mouth daily.    vitamin B-12 (CYANOCOBALAMIN) 1000 MCG tablet Take 1,000 mcg by mouth daily.   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    Left rib fracture_7th rib    Leukocytosis    HTN (hypertension)    Hypothyroid    CAD (coronary artery disease)    Porokeratosis 05/15/2021   Aortic atherosclerosis ([place]) 05/05/2021   Emphysema lung (  [place]) 05/05/2021   Thrombocytopenia ([place]) 04/29/2021   Splinter of foot without infection 02/23/2021   Arm laceration 02/09/2021   Hypokalemia 10/24/2020   Injury by nail 08/15/2020   Shingles 07/24/2020   Rash 07/02/2020   Colon cancer screening 07/02/2020   Sick sinus syndrome ([place]) 12/28/2019   Acquired absence of kidney 09/13/2019   Edema of lower extremity 09/13/2019   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 stage IV, or unspecified 09/13/2019   Proteinuria 09/13/2019   Lower extremity pain, bilateral    Diabetic foot ulcer associated with type 1 diabetes mellitus ([place])    Acute on chronic systolic CHF (congestive heart failure) ([place])    Cellulitis 08/18/2019   Lower limb ulcer, calf ([place]) 08/01/2019   Left hip pain 07/16/2019   Pain due to onychomycosis of toenails of both feet 04/03/2019   Swelling of limb 02/07/2019   CKD (chronic kidney disease) stage 3, GFR 30-59 ml/min ([facility]) 09/16/2018   Left shoulder pain 09/16/2018   Chronic cholecystitis 12/29/2017   Graves disease 12/29/2017   Peripheral neuropathy 12/29/2017   S/p nephrectomy 12/29/2017   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   Symptomatic bradycardia 12/02/2017   CKD (chronic kidney disease), stage IIIa 93/23/5573   Chronic systolic CHF (congestive heart failure) ([place]) 12/02/2017   Saturday night paralysis 11/12/2017   Hypoglycemia 10/28/2017   Goals of care, counseling/discussion 10/24/2017   Lymphedema 10/20/2017   Iron deficiency anemia 09/11/2017   Fall 08/13/2017   Humerus fracture 08/13/2017   Anemia 01/12/2017   Bilateral carotid artery stenosis 06/29/2016   Hand laceration 12/16/2015   Adjustment disorder with depressed mood 08/11/2015   Cardiomyopathy, idiopathic ([place]) 04/22/2015   CAD in native artery 11/02/2014   Mantle cell lymphoma ([place]) 08/03/2014   History of colonic polyps 04/29/2014   Irregular heart beat 04/22/2014   SOB (shortness of breath) 04/22/2014   Stress 03/21/2014   PVC (premature ventricular contraction) 02/21/2014   GERD (gastroesophageal reflux disease) 10/23/2013   Gastroparesis 06/25/2013   Chest pain 04/13/2013   Thyroid disease 04/13/2013   Diabetes ([place]) 04/13/2013   Essential hypertension, benign 04/13/2013   Hypercholesterolemia 04/13/2013    Conditions to be addressed/monitored:CHF and DMII  Care Plan : RNCM  Diabetes  Updates made by Leona Singleton, RN since  03/02/2022 12:00 AM     Problem: Hypoglycemia causing syncopal episodes   Priority: Medium     Long-Range Goal: Patient will report maintaing Hgb A1C of 7 or below in the next 90 days   Start Date: 01/27/2021  Expected End Date: 02/08/2022  Recent Progress: Not on track  Priority: Medium  Note:   Objective:  Lab Results  Component Value Date   HGBA1C 7.0 01/23/2021  Current Barriers:  Knowledge Deficits related to basic Diabetes pathophysiology and self care/management as evidenced by multiple syncopal episodes related to hypoglycemia.  Limited Social Support   Trouble getting insulin pump supplies-reports he now has insulin pump sensors  Latest Hgb A1C 7.4 on 01/28/22.    2/27--States he feels pretty good.  Fasting blood sugar this morning was 86, lowest recently was 60's and average of 150's.  Has been using/eating donuts  to treat hypoglycemia.Marland Kitchen  RNCM still encouraging patient to contact Endocrinology.  Has new scale but not weighed today.  BP 135/852  3/22--  reports doing fair.  No increase in sob, home health continues to do unna boots weekly.  Has not seen or spoken to endocrinology.  REPORTS A  FEW HYPOGLYCEMIC EVENTS WITH RATES IN 60'S.  FASTING THIS MORNING WAS 86 WITH RECENT RATES 80-150'S.  BP 135/85.  Patient reports trying to arrange transportation to [facility].  4/21--States he is doing well.  Attended appointment with Endocrinology with A1C of 7.4, no changes to insulin pump.  Fasting CBG this morning was 88, recent ranges were 58-400.  Weight this morning 219.6.  Denies chest pain, SOB, and states lower extremity edema is a little better.  Home health nurse continue to change unna boots.  Does acknowledge transportation concerns; reviewed updated protocol; encouraged to verify with [employer] transportation benefits, knows to call provider office  5/22--reports he is beginning to feel better; last week he felt weak and wobbly walking.  Denies fall and says  the feeling lasted about 48 hours, states his blood sugars were in the 150's at the time and BP 140/80.  Fells better, fasting CBG 180 today with recent ranges of 150-250's and a few hypoglycemia.  Weight has been between 225-230 pounds.  lower extremity swelling is better, SOB about the same and denies chest pains  Case Manager Clinical Goal(s):  patient will demonstrate improved adherence to prescribed treatment plan for diabetes self care/management as evidenced by: at least 4 times a day monitoring and recording of CBG, adherence to ADA/ carb modified diet, adherence to prescribed medication regimen, contacting provider for new or worsened symptoms or questions  Diabetes Interventions:  (Status: Goal on track: YES)  Long Term Goal  Collaboration with [name], MD regarding development and update of comprehensive plan of care as evidenced by provider attestation and co-signature Inter-disciplinary care team collaboration (see longitudinal plan of care) Provided education to patient about basic DM disease process Discussed medications and encouraged medication compliance Discussed plans with patient for ongoing care management follow up and provided patient with direct contact information for care management team Encouraged continued participation with CCM Pharmacist  Reviewed glucose ranges and hypoglycemic events; and discussed dangers of hypoglycemia and treatment options, need for immediate sugar replacement with juice, sugar, soda in addition to snack or candy bar or glucose tablets Discussed proper treatment of hypoglycemia Discussed healthy meal and snack options Encouraged to continue working with Home Health Nursing  Discussed and encouraged patient to be proactive with health instead of reactive after falls Fall precautions and preventions reviewed and discussed, encouraged to use walker with all ambulation,  Instructed and encouraged to wear life alert at all times Discussed  logging blood sugars and writing down insulin doses taken to help provider manage medications; sent 2022 Calendar Booklet to help with keeping log of blood sugars and insulin dose Reviewed and discussed proper diabetic foot care, encouraged patient to keep check on right foot splinter wound for signs and symptoms of infection Discussed and encouraged and reinforced patient to wear bedroom/house/slides while in the home and not to walk around bare foot Discussed placing [place], agreeable at this time referral placed Congratulated on current Hgb A1C Instructed to arrange follow up appointment with PCP  Heart Failure Interventions:  (Status: Goal on track: NO.)  Long Term Goal  Basic overview and discussion of pathophysiology of Heart Failure reviewed Provided education on low sodium diet Reviewed Heart Failure Action Plan in depth and provided written copy Discussed importance of daily weight and advised patient to weigh and record daily Reviewed role of diuretics in prevention of fluid overload and management of heart failure Discussed the importance of keeping all appointments with provider  Provided patient with education about the role of exercise in the management of heart failure  Encouraged to weigh self daily and discussed when to weigh Fall precautions and preventions  Patient Goals/Self-Care Activities Check blood sugar at least 5 times a day Check blood sugar if I feel it is too high or too low Take the blood sugar meter and log to all doctor visits  Consider drinking Boost or other supplement in the evenings if not eating a complete meal Make sure to have candy or glucose tablets on you at all times; try drinking coke along with eating snack to treat hypoglycemia Eat at least 3 meals a day with snack in between to help prevent hypoglycemia Fall precautions and preventions; Wear life alert at all times Contact Endocrinology for SUSTAINED hypoglycemic  episodes Weigh self daily using walker to balance; request help when putting new batteries in scale Low salt carb modified diet Schedule appointment with PCP ASAP  Follow Up Plan: The care management team will reach out to the patient again over the next 45  business days.        Plan:The care management team will reach out to the patient again over the next 45 days.  [name] RN, MSN RN Care Management Coordinator [place] [phone] [email or web address]

## 2022-03-03 ENCOUNTER — Other Ambulatory Visit: Payer: Self-pay

## 2022-03-03 MED ORDER — PRAVASTATIN SODIUM 40 MG PO TABS: 40.0000 mg | ORAL_TABLET | Freq: Every day | ORAL | 3 refills | Status: DC

## 2022-03-03 MED ORDER — LEVOTHYROXINE SODIUM 25 MCG PO TABS: 25.0000 ug | ORAL_TABLET | Freq: Every day | ORAL | 1 refills | Status: DC

## 2022-03-03 MED ORDER — POTASSIUM CHLORIDE ER 10 MEQ PO TBCR
10.0000 meq | EXTENDED_RELEASE_TABLET | Freq: Two times a day (BID) | ORAL | 1 refills | Status: DC
Start: 2022-03-03 — End: 2022-04-09

## 2022-03-04 ENCOUNTER — Telehealth: Payer: Self-pay | Admitting: *Deleted

## 2022-03-04 NOTE — Telephone Encounter (Signed)
   Telephone encounter was:  Successful.  03/04/2022 Name: Gary [name] MRN: [id number] DOB: 09-09-1946  Gary [name] is a 76 y.o. year old male who is a primary care patient of [name], MD . The community resource team was consulted for assistance with Transportation Needs   Care guide performed the following interventions: Patient provided with information about care guide support team and interviewed to confirm resource needs Discussed resources to assist with transportation  .Called safe transportation and patient , Booked for 03/10/2022 patient has an additional appt on 03/17/2022 that I will also book for him Also had him mailed a LINK application for city transportation   Follow Up Plan:  Care guide will follow up with patient by phone over the next days  [place], Care Management  [phone] [address] , [place] [address] Email : [email or web address]

## 2022-03-04 NOTE — Telephone Encounter (Signed)
   Telephone encounter was:  Successful.  03/04/2022 Name: ERIN UECKER MRN: [id number] DOB: Sep 14, 1946  GOHAN COLLISTER is a 76 y.o. year old male who is a primary care patient of [name], MD . The community resource team was consulted for assistance with Transportation Needs   Care guide performed the following interventions: Patient provided with information about care guide support team and interviewed to confirm resource needs.  Follow Up Plan:  Care guide will follow up with patient by phone over the next day and Care guide will outreach resources to assist patient with transportation  [place] , [place], Care Management  [phone] [address] , [place] [address] Email : [email or web address]

## 2022-03-06 DIAGNOSIS — E1043 Type 1 diabetes mellitus with diabetic autonomic (poly)neuropathy: Secondary | ICD-10-CM | POA: Diagnosis not present

## 2022-03-06 DIAGNOSIS — F32A Depression, unspecified: Secondary | ICD-10-CM

## 2022-03-06 DIAGNOSIS — I5022 Chronic systolic (congestive) heart failure: Secondary | ICD-10-CM | POA: Diagnosis not present

## 2022-03-06 DIAGNOSIS — E103519 Type 1 diabetes mellitus with proliferative diabetic retinopathy with macular edema, unspecified eye: Secondary | ICD-10-CM | POA: Diagnosis not present

## 2022-03-06 DIAGNOSIS — I2510 Atherosclerotic heart disease of native coronary artery without angina pectoris: Secondary | ICD-10-CM | POA: Diagnosis not present

## 2022-03-06 DIAGNOSIS — K5900 Constipation, unspecified: Secondary | ICD-10-CM

## 2022-03-06 DIAGNOSIS — Z95828 Presence of other vascular implants and grafts: Secondary | ICD-10-CM

## 2022-03-06 DIAGNOSIS — I890 Lymphedema, not elsewhere classified: Secondary | ICD-10-CM | POA: Diagnosis not present

## 2022-03-06 DIAGNOSIS — Z794 Long term (current) use of insulin: Secondary | ICD-10-CM

## 2022-03-06 DIAGNOSIS — J439 Emphysema, unspecified: Secondary | ICD-10-CM | POA: Diagnosis not present

## 2022-03-06 DIAGNOSIS — D509 Iron deficiency anemia, unspecified: Secondary | ICD-10-CM

## 2022-03-06 DIAGNOSIS — Z8572 Personal history of non-Hodgkin lymphomas: Secondary | ICD-10-CM

## 2022-03-06 DIAGNOSIS — E785 Hyperlipidemia, unspecified: Secondary | ICD-10-CM

## 2022-03-06 DIAGNOSIS — K3184 Gastroparesis: Secondary | ICD-10-CM | POA: Diagnosis not present

## 2022-03-06 DIAGNOSIS — E1022 Type 1 diabetes mellitus with diabetic chronic kidney disease: Secondary | ICD-10-CM | POA: Diagnosis not present

## 2022-03-06 DIAGNOSIS — Z9181 History of falling: Secondary | ICD-10-CM

## 2022-03-06 DIAGNOSIS — Z950 Presence of cardiac pacemaker: Secondary | ICD-10-CM

## 2022-03-06 DIAGNOSIS — H04123 Dry eye syndrome of bilateral lacrimal glands: Secondary | ICD-10-CM

## 2022-03-06 DIAGNOSIS — I872 Venous insufficiency (chronic) (peripheral): Secondary | ICD-10-CM | POA: Diagnosis not present

## 2022-03-06 DIAGNOSIS — I130 Hypertensive heart and chronic kidney disease with heart failure and stage 1 through stage 4 chronic kidney disease, or unspecified chronic kidney disease: Secondary | ICD-10-CM | POA: Diagnosis not present

## 2022-03-06 DIAGNOSIS — E039 Hypothyroidism, unspecified: Secondary | ICD-10-CM

## 2022-03-06 DIAGNOSIS — E1051 Type 1 diabetes mellitus with diabetic peripheral angiopathy without gangrene: Secondary | ICD-10-CM | POA: Diagnosis not present

## 2022-03-06 DIAGNOSIS — N1831 Chronic kidney disease, stage 3a: Secondary | ICD-10-CM | POA: Diagnosis not present

## 2022-03-10 ENCOUNTER — Ambulatory Visit (INDEPENDENT_AMBULATORY_CARE_PROVIDER_SITE_OTHER): Payer: Medicare Other | Admitting: Nurse Practitioner

## 2022-03-10 ENCOUNTER — Ambulatory Visit (INDEPENDENT_AMBULATORY_CARE_PROVIDER_SITE_OTHER): Payer: Medicare Other

## 2022-03-10 ENCOUNTER — Encounter (INDEPENDENT_AMBULATORY_CARE_PROVIDER_SITE_OTHER): Payer: Self-pay | Admitting: Nurse Practitioner

## 2022-03-10 ENCOUNTER — Telehealth: Payer: Self-pay | Admitting: *Deleted

## 2022-03-10 VITALS — BP 120/74 | HR 72 | Resp 17 | Ht 69.0 in | Wt 242.0 lb

## 2022-03-10 VITALS — Ht 67.0 in | Wt 236.0 lb

## 2022-03-10 DIAGNOSIS — I890 Lymphedema, not elsewhere classified: Secondary | ICD-10-CM | POA: Diagnosis not present

## 2022-03-10 DIAGNOSIS — Z0000 Encounter for general adult medical examination without abnormal findings: Secondary | ICD-10-CM

## 2022-03-10 DIAGNOSIS — E7800 Pure hypercholesterolemia, unspecified: Secondary | ICD-10-CM | POA: Diagnosis not present

## 2022-03-10 DIAGNOSIS — I10 Essential (primary) hypertension: Secondary | ICD-10-CM

## 2022-03-10 NOTE — Telephone Encounter (Signed)
   Telephone encounter was:  Successful.  03/10/2022 Name: Gary [name] MRN: [id number] DOB: [birth year]  Gary [name] is a 76 y.o. year old male who is a primary care patient of [name], MD . The community resource team was consulted for assistance with Transportation Needs   Care guide performed the following interventions:Scheduled transport for 5/30 and 6/6/ 2023 . Also mailed Link application for future transportation needs   Follow Up Plan:  No further follow up planned at this time. The patient has been provided with needed resources.  [place], Care Management  [phone] [address] , [place] [address] Email : [email or web address]

## 2022-03-10 NOTE — Patient Instructions (Addendum)
  Mr. Keeling , Thank you for taking time to come for your Medicare Wellness Visit. I appreciate your ongoing commitment to your health goals. Please review the following plan we discussed and let me know if I can assist you in the future.   These are the goals we discussed:  Goals      Increase physical activity     Stay active Walk for exercise as tolerated        This is a list of the screening recommended for you and due dates:  Health Maintenance  Topic Date Due   Urine Protein Check  09/22/2017   Hemoglobin A1C  07/25/2021   COVID-19 Vaccine (1) 03/26/2022*   Zoster (Shingles) Vaccine (1 of 2) 09/11/2022*   Eye exam for diabetics  03/24/2022   Flu Shot  05/12/2022   Complete foot exam   08/21/2022   Colon Cancer Screening  01/09/2024   Tetanus Vaccine  08/26/2031   Pneumonia Vaccine  Completed   Hepatitis C Screening: USPSTF Recommendation to screen - Ages 18-79 yo.  Completed   HPV Vaccine  Aged Out  *Topic was postponed. The date shown is not the original due date.

## 2022-03-10 NOTE — Progress Notes (Signed)
Subjective:   Gary [name] is a 76 y.o. male who presents for Medicare Annual/Subsequent preventive examination.  Review of Systems    No ROS.  Medicare Wellness Virtual Visit.  Visual/audio telehealth visit, UTA vital signs.   See social history for additional risk factors.   Cardiac Risk Factors include: advanced age (>67mn, >>47women);diabetes mellitus     Objective:    Today's Vitals   03/10/22 0902  Weight: 236 lb (107 kg)  Height: '5\' 7"'$  (1.702 m)   Body mass index is 36.96 kg/m.     03/10/2022    9:23 AM 08/30/2021    6:03 PM 08/29/2021   12:45 AM 08/25/2021   12:47 PM 05/01/2021   11:00 AM 03/07/2021    9:13 AM 01/30/2021   10:59 AM  Advanced Directives  Does Patient Have a Medical Advance Directive? No  No No No Yes No  Type of ATeacher, early years/preLiving will   Does patient want to make changes to medical advance directive?      No - Patient declined   Copy of [place] Chart?      No - copy requested   Would patient like information on creating a medical advance directive? No - Patient declined No - Patient declined   No - Patient declined      Current Medications (verified) Outpatient Encounter Medications as of 03/10/2022  Medication Sig   acetaminophen (TYLENOL) 325 MG tablet Take 650 mg by mouth every 6 (six) hours as needed. (Patient not taking: Reported on 12/30/2021)   bacitracin 500 UNIT/GM ointment Apply 1 application topically daily.   Cholecalciferol (VITAMIN D3) 50 MCG (2000 UT) CAPS Take 2,000 Units by mouth daily.   feeding supplement, GLUCERNA SHAKE, (GLUCERNA SHAKE) LIQD Take 237 mLs by mouth 2 (two) times daily between meals. (Patient not taking: Reported on 01/26/2022)   ferrous sulfate 325 (65 FE) MG tablet Take 325 mg by mouth daily with breakfast.   FLUoxetine (PROZAC) 20 MG capsule Take 1 capsule (20 mg total) by mouth daily.   furosemide (LASIX) 40 MG tablet Take 1 tablet (40 mg total)  by mouth daily. (Patient taking differently: Take 80 mg by mouth 2 (two) times daily. Patient taking 80 MG in the AM and 40 mg in the PM.)   glucose 4 GM chewable tablet Chew 1 tablet (4 g total) by mouth once as needed for low blood sugar.   insulin aspart (NOVOLOG) 100 UNIT/ML injection Inject 6 Units into the skin 3 (three) times daily with meals. (Patient taking differently: Inject 80-100 Units into the skin once. INSULIN PUMP :Patient counts carb intake and calculates dose, with meals night time if CBG is less than 200 patient holds insulin. Patient taking 80-100 units daily.)   levothyroxine (SYNTHROID) 25 MCG tablet Take 1 tablet (25 mcg total) by mouth daily before breakfast.   nystatin cream (MYCOSTATIN) APPLY TO AFFECTED AREA TWICE A DAY   potassium chloride (KLOR-CON) 10 MEQ tablet Take 1 tablet (10 mEq total) by mouth 2 (two) times daily.   pravastatin (PRAVACHOL) 40 MG tablet Take 1 tablet (40 mg total) by mouth daily.   vitamin B-12 (CYANOCOBALAMIN) 1000 MCG tablet Take 1,000 mcg by mouth daily.   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    Gastroparesis    Graves disease    Hypercholesterolemia    Hypertension    Mantle cell lymphoma ([facility]) 07/2014   Peripheral neuropathy    Shingles    Past Surgical History:  Procedure Laterality Date   CHOLECYSTECTOMY     NEPHRECTOMY     right   PACEMAKER INSERTION N/A 12/07/2017   Procedure: INSERTION PACEMAKER DUAL CHAMBER INITIAL INSERT;  Surgeon: Isaias Cowman, MD;  Location: [facility] ORS;  Service: Cardiovascular;  Laterality: N/A;    PORTA CATH INSERTION N/A 11/03/2017   Procedure: PORTA CATH INSERTION;  Surgeon: Katha Cabal, MD;  Location: [facility];  Service: Cardiovascular;  Laterality: N/A;   Family History  Problem Relation Age of Onset   Breast cancer Mother    Diabetes Father    Cancer Father        Lymphoma   Alcohol abuse Maternal Uncle    Diabetes Sister    Heart disease Other        grandfather   Social History   Socioeconomic History   Marital status: Widowed    Spouse name: Not on file   Number of children: 1   Years of education: 50   Highest education level: 12th grade  Occupational History   Occupation: Retired  Tobacco Use   Smoking status: Former   Smokeless tobacco: Current    Types: Nurse, children's Use: Never used  Substance and Sexual Activity   Alcohol use: Yes    Alcohol/week: 3.0 standard drinks    Types: 3 Cans of beer per week    Comment: nightly   Drug use: Never   Sexual activity: Not Currently  Other Topics Concern   Not on file  Social History Narrative   Not on file   Social Determinants of Health   Financial Resource Strain: Low Risk    Difficulty of Paying Living Expenses: Not hard at all  Food Insecurity: No Food Insecurity   Worried About Charity fundraiser in the Last Year: Never true   Arboriculturist in the Last Year: Never true  Transportation Needs: Unmet Transportation Needs   Lack of Transportation (Medical): Yes   Lack of Transportation (Non-Medical): Yes  Physical Activity: Inactive   Days of Exercise per Week: 0 days   Minutes of Exercise per Session: 0 min  Stress: No Stress Concern Present   Feeling of Stress : Only a little  Social Connections: Socially Isolated   Frequency of Communication with Friends and Family: More than three times a week   Frequency of Social Gatherings with Friends and Family: Twice a week   Attends Religious Services: Never   Marine scientist or Organizations: No   Attends  Archivist Meetings: Never   Marital Status: Widowed    Tobacco Counseling Ready to quit: Not Answered Counseling given: Not Answered   Clinical Intake:  Pre-visit preparation completed: Yes        Diabetes: No             Activities of Daily Living    03/10/2022    9:07 AM 08/30/2021    6:03 PM  In your present state of  health, do you have any difficulty performing the following activities:  Hearing? 0   Vision? 0   Difficulty concentrating or making decisions? 0   Walking or climbing stairs? 1   Dressing or bathing? 0   Doing errands, shopping? 1 1  Comment Community assist with transportation. Does not drive.   Preparing Food and eating ? N   Comment Meals on Wheels assist. Otherwise microwave meals. Self feeds.   Using the Toilet? N   In the past six months, have you accidently leaked urine? N   Do you have problems with loss of bowel control? N   Managing your Medications? N   Managing your Finances? N   Housekeeping or managing your Housekeeping? N     Patient Care Team: [name], MD as PCP - General (Internal Medicine) Lloyd Huger, MD as Consulting Physician (Oncology) Lucky Cowboy Erskine Squibb, MD as Referring Physician (Vascular Surgery) Schnier, Dolores Lory, MD (Vascular Surgery) Corey Skains, MD as Consulting Physician (Cardiology) Leona Singleton, RN as Case Manager  Indicate any recent Medical Services you may have received from other than [facility] providers in the past year (date may be approximate).     Assessment:   This is a routine wellness examination for [place].  Virtual Visit via Telephone Note  I connected with  [name] on 03/10/22 at  9:00 AM EDT by telephone and verified that I am speaking with the correct person using two identifiers.  Persons participating in the virtual visit: patient/Nurse Health Advisor   I discussed the limitations of performing an evaluation and management service by telehealth. We  continued and completed visit with audio only. Some vital signs may be absent or patient reported.   Hearing/Vision screen Hearing Screening - Comments:: Patient is able to hear conversational tones without difficulty.  No issues reported. Vision Screening - Comments:: Wears corrective lenses Cataracts extracted, bilateral No retinopathy  They have seen their ophthalmologist  Dietary issues and exercise activities discussed: Current Exercise Habits: The patient does not participate in regular exercise at present Regular diet   Goals Addressed             This Visit's Progress    Increase physical activity       Stay active Walk for exercise as tolerated       Depression Screen    03/10/2022    9:15 AM 03/07/2021    9:15 AM 12/06/2020    4:36 PM 11/27/2020    2:53 PM 06/24/2020    2:12 PM 03/06/2020    9:16 AM 09/04/2019    1:38 PM  PHQ 2/9 Scores  PHQ - 2 Score 0 0 1 1 0 0 0  PHQ- 9 Score     6      Fall Risk    03/10/2022    9:12 AM 10/01/2021    2:54 PM 03/26/2021   12:23 PM 03/07/2021    9:15 AM 12/06/2020    4:35 PM  [place] in the past year? '1 1 1 '$ 0 1  Comment   Falls on 6/1 and 6/13, no injury    Number falls in past yr: '1 1 1 '$ 0 1  Comment Sought medical care as needed. No falls since last reported in January.      Injury with Fall?  1 0 0 1  Risk for fall due to : Impaired balance/gait History of fall(s);Medication side effect;Impaired balance/gait;Impaired mobility;Impaired vision   Medication side effect;Impaired  vision;Impaired mobility;Impaired balance/gait;History of fall(s)  Risk for fall due to: Comment Cane/walker/walking stick in use      Follow up Falls evaluation completed Falls evaluation completed;Education provided;Falls prevention discussed  Falls evaluation completed Falls evaluation completed;Education provided;Falls prevention discussed    FALL RISK PREVENTION PERTAINING TO THE HOME: Home free of loose throw rugs in walkways, pet  beds, electrical cords, etc? Yes  Adequate lighting in your home to reduce risk of falls? Yes   ASSISTIVE DEVICES UTILIZED TO PREVENT FALLS: Life alert? Yes  Use of a cane, walker or w/c? Yes  Grab bars in the bathroom? Yes  Shower chair or bench in shower? Not in use Elevated toilet seat or a handicapped toilet? No   TIMED UP AND GO: Was the test performed? No .   Cognitive Function: Patient is alert and oriented x3.     10/22/2016   11:17 AM  MMSE - Mini Mental State Exam  Orientation to time 5  Orientation to Place 5  Registration 3  Attention/ Calculation 5  Recall 3  Language- name 2 objects 2  Language- repeat 1  Language- follow 3 step command 3  Language- read & follow direction 1  Write a sentence 1  Copy design 1  Total score 30        03/10/2022    9:16 AM 03/07/2021    9:18 AM 03/06/2020    9:24 AM 10/22/2017   12:16 PM  6CIT Screen  What Year? 0 points 0 points 0 points 0 points  What month? 0 points 0 points 0 points 0 points  What time? 0 points 0 points 0 points 0 points  Count back from 20 0 points 0 points 0 points 0 points  Months in reverse 0 points 0 points 0 points 0 points  Repeat phrase 0 points 0 points 2 points 0 points  Total Score 0 points 0 points 2 points 0 points    Immunizations Immunization History  Administered Date(s) Administered   Fluad Quad(high Dose 65+) 07/10/2019, 06/24/2020, 07/10/2021   Influenza Split 07/13/2014   Influenza, High Dose Seasonal PF 10/22/2016, 10/21/2017, 09/14/2018   Influenza, Seasonal, Injecte, Preservative Fre 08/27/2008   Influenza,inj,Quad PF,6+ Mos 06/20/2013, 08/09/2015   Influenza-Unspecified 06/20/2013, 08/09/2015   Pneumococcal Conjugate-13 12/26/2014   Pneumococcal Polysaccharide-23 10/22/2017   Td 08/25/2021   Tdap 07/14/2018   Screening Tests Health Maintenance  Topic Date Due   URINE MICROALBUMIN  09/22/2017   HEMOGLOBIN A1C  07/25/2021   COVID-19 Vaccine (1) 03/26/2022 (Originally  01/04/1947)   Zoster Vaccines- Shingrix (1 of 2) 09/11/2022 (Originally 07/08/1967)   OPHTHALMOLOGY EXAM  03/24/2022   INFLUENZA VACCINE  05/12/2022   FOOT EXAM  08/21/2022   COLONOSCOPY (Pts 45-7yr Insurance coverage will need to be confirmed)  01/09/2024   TETANUS/TDAP  08/26/2031   Pneumonia Vaccine 76 Years old  Completed   Hepatitis C Screening  Completed   HPV VACCINES  Aged Out   Health Maintenance Health Maintenance Due  Topic Date Due   URINE MICROALBUMIN  09/22/2017   HEMOGLOBIN A1C  07/25/2021   Vision Screening: Recommended annual ophthalmology exams for early detection of glaucoma and other disorders of the eye.  Dental Screening: Recommended annual dental exams for proper oral hygiene  Community Resource Referral / Chronic Care Management: CRR required this visit?  No   CCM required this visit?  No      Plan:   Keep all routine maintenance appointments.   I have personally reviewed  and noted the following in the patient's chart:   Medical and social history Use of alcohol, tobacco or illicit drugs  Current medications and supplements including opioid prescriptions. Patient is not currently taking opioid prescriptions. Functional ability and status Nutritional status Physical activity Advanced directives List of other physicians Hospitalizations, surgeries, and ER visits in previous 12 months Vitals Screenings to include cognitive, depression, and falls Referrals and appointments  In addition, I have reviewed and discussed with patient certain preventive protocols, quality metrics, and best practice recommendations. A written personalized care plan for preventive services as well as general preventive health recommendations were provided to patient.     Varney Biles, LPN   04/14/8888

## 2022-03-11 DIAGNOSIS — E1151 Type 2 diabetes mellitus with diabetic peripheral angiopathy without gangrene: Secondary | ICD-10-CM

## 2022-03-11 DIAGNOSIS — I5022 Chronic systolic (congestive) heart failure: Secondary | ICD-10-CM

## 2022-03-13 DIAGNOSIS — I5022 Chronic systolic (congestive) heart failure: Secondary | ICD-10-CM | POA: Diagnosis not present

## 2022-03-13 DIAGNOSIS — E1022 Type 1 diabetes mellitus with diabetic chronic kidney disease: Secondary | ICD-10-CM | POA: Diagnosis not present

## 2022-03-13 DIAGNOSIS — E1051 Type 1 diabetes mellitus with diabetic peripheral angiopathy without gangrene: Secondary | ICD-10-CM | POA: Diagnosis not present

## 2022-03-13 DIAGNOSIS — I890 Lymphedema, not elsewhere classified: Secondary | ICD-10-CM | POA: Diagnosis not present

## 2022-03-13 DIAGNOSIS — N1831 Chronic kidney disease, stage 3a: Secondary | ICD-10-CM | POA: Diagnosis not present

## 2022-03-13 DIAGNOSIS — I130 Hypertensive heart and chronic kidney disease with heart failure and stage 1 through stage 4 chronic kidney disease, or unspecified chronic kidney disease: Secondary | ICD-10-CM | POA: Diagnosis not present

## 2022-03-17 ENCOUNTER — Inpatient Hospital Stay: Payer: Medicare Other

## 2022-03-18 NOTE — Progress Notes (Signed)
Subjective:    [name], male    DOB: 03-11-1946, 76 y.o.   MRN: [id number] No chief complaint on file.   Gary [name] is a 76 year old male who presents today for follow-up evaluation of lower extremity edema following Unna wraps done by home health.  The patient is tolerating the wraps well.  There are some worsening swelling in areas where the wire was removed due to being too tight but overall is doing well.   Review of Systems  Cardiovascular:  Positive for leg swelling.  All other systems reviewed and are negative.     Objective:   Physical Exam Vitals reviewed.  HENT:     Head: Normocephalic.  Cardiovascular:     Rate and Rhythm: Normal rate.     Pulses: Decreased pulses.  Pulmonary:     Effort: Pulmonary effort is normal.  Skin:    General: Skin is warm and dry.  Neurological:     Mental Status: He is alert and oriented to person, place, and time.     Gait: Gait abnormal.  Psychiatric:        Mood and Affect: Mood normal.        Behavior: Behavior normal.        Thought Content: Thought content normal.        Judgment: Judgment normal.    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    Gastroparesis    Graves disease    Hypercholesterolemia    Hypertension    Mantle cell lymphoma ([place]) 07/2014   Peripheral neuropathy    Shingles     Social History   Socioeconomic History   Marital status: Widowed    Spouse name: Not on file   Number of children: 1   Years of education: 26   Highest education level: 12th grade  Occupational History   Occupation: Retired  Tobacco Use   Smoking status: Former   Smokeless tobacco: Current    Types: Nurse, children's Use: Never  used  Substance and Sexual Activity   Alcohol use: Yes    Alcohol/week: 3.0 standard drinks    Types: 3 Cans of beer per week    Comment: nightly   Drug use: Never   Sexual activity: Not Currently  Other Topics Concern   Not on file  Social History Narrative   Not on file   Social Determinants of Health   Financial Resource Strain: Low Risk    Difficulty of Paying Living Expenses: Not hard at all  Food Insecurity: No Food Insecurity   Worried About Charity fundraiser in the Last Year: Never true   Arboriculturist in the Last Year: Never true  Transportation Needs: Unmet Transportation Needs   Lack of Transportation (Medical): Yes   Lack of Transportation (Non-Medical): Yes  Physical Activity: Inactive   Days of Exercise per Week: 0 days   Minutes of Exercise per Session: 0 min  Stress: No Stress Concern Present   Feeling of Stress : Only a little  Social Connections: Socially Isolated   Frequency of Communication with Friends  and Family: More than three times a week   Frequency of Social Gatherings with Friends and Family: Twice a week   Attends Religious Services: Never   Marine scientist or Organizations: No   Attends Archivist Meetings: Never   Marital Status: Widowed  Human resources officer Violence: Not At Risk   Fear of Current or Ex-Partner: No   Emotionally Abused: No   Physically Abused: No   Sexually Abused: No    Past Surgical History:  Procedure Laterality Date   CHOLECYSTECTOMY     NEPHRECTOMY     right   PACEMAKER INSERTION N/A 12/07/2017   Procedure: INSERTION PACEMAKER DUAL CHAMBER INITIAL INSERT;  Surgeon: Isaias Cowman, MD;  Location: [facility] ORS;  Service: Cardiovascular;  Laterality: N/A;   PORTA CATH INSERTION N/A 11/03/2017   Procedure: PORTA CATH INSERTION;  Surgeon: Katha Cabal, MD;  Location: [facility];  Service: Cardiovascular;  Laterality: N/A;    Family History  Problem Relation Age of Onset   Breast  cancer Mother    Diabetes Father    Cancer Father        Lymphoma   Alcohol abuse Maternal Uncle    Diabetes Sister    Heart disease Other        grandfather    Allergies  Allergen Reactions   Rofecoxib Nausea Only       Latest Ref Rng & Units 10/17/2021    4:24 PM 09/13/2021    6:08 AM 09/07/2021    5:04 AM  CBC  WBC 3.8 - 10.8 Thousand/uL 5.2   2.8   2.9    Hemoglobin 13.2 - 17.1 g/dL 13.1   13.5   14.2    Hematocrit 38.5 - 50.0 % 39.5   40.8   42.9    Platelets 140 - 400 Thousand/uL 149   122   103        CMP     Component Value Date/Time   NA 139 12/17/2021 1041   NA 138 07/15/2014 0515   K 3.9 12/17/2021 1041   K 3.5 01/16/2015 1429   CL 101 12/17/2021 1041   CL 110 (H) 07/15/2014 0515   CO2 28 12/17/2021 1041   CO2 24 07/15/2014 0515   GLUCOSE 132 (H) 12/17/2021 1041   GLUCOSE 145 (H) 07/15/2014 0515   BUN 30 (H) 12/17/2021 1041   BUN 17 07/15/2014 0515   CREATININE 1.36 12/17/2021 1041   CREATININE 1.38 (H) 10/17/2021 1624   CALCIUM 8.4 12/17/2021 1041   CALCIUM 7.1 (L) 07/15/2014 0515   PROT 6.7 01/19/2022 1059   PROT 6.2 (L) 10/22/2014 1000   ALBUMIN 4.2 01/19/2022 1059   ALBUMIN 2.6 (L) 10/22/2014 1000   AST 14 01/19/2022 1059   AST 19 10/22/2014 1000   ALT 8 01/19/2022 1059   ALT 21 10/22/2014 1000   ALKPHOS 126 (H) 01/19/2022 1059   ALKPHOS 118 (H) 10/22/2014 1000   BILITOT 0.5 01/19/2022 1059   BILITOT 0.8 10/22/2014 1000   GFRNONAA 48 (L) 09/13/2021 0608   GFRNONAA >60 01/16/2015 1429   GFRAA 41 (L) 01/24/2020 0925   GFRAA >60 01/16/2015 1429     No results found.     Assessment & Plan:   1. Lymphedema Leg swelling is much improved however there are some small open areas.  Patient will continue with the wraps as done by home health.  2. Primary hypertension Continue antihypertensive medications as already ordered, these medications have been reviewed  and there are no changes at this time.   3. Hypercholesterolemia Continue  statin as ordered and reviewed, no changes at this time    Current Outpatient Medications on File Prior to Visit  Medication Sig Dispense Refill   bacitracin 500 UNIT/GM ointment Apply 1 application topically daily.     Cholecalciferol (VITAMIN D3) 50 MCG (2000 UT) CAPS Take 2,000 Units by mouth daily.     ferrous sulfate 325 (65 FE) MG tablet Take 325 mg by mouth daily with breakfast.     FLUoxetine (PROZAC) 20 MG capsule Take 1 capsule (20 mg total) by mouth daily. 90 capsule 3   furosemide (LASIX) 40 MG tablet Take 1 tablet (40 mg total) by mouth daily. (Patient taking differently: Take 80 mg by mouth 2 (two) times daily. Patient taking 80 MG in the AM and 40 mg in the PM.) 30 tablet    glucose 4 GM chewable tablet Chew 1 tablet (4 g total) by mouth once as needed for low blood sugar. 50 tablet 0   insulin aspart (NOVOLOG) 100 UNIT/ML injection Inject 6 Units into the skin 3 (three) times daily with meals. (Patient taking differently: Inject 80-100 Units into the skin once. INSULIN PUMP :Patient counts carb intake and calculates dose, with meals night time if CBG is less than 200 patient holds insulin. Patient taking 80-100 units daily.)     levothyroxine (SYNTHROID) 25 MCG tablet Take 1 tablet (25 mcg total) by mouth daily before breakfast. 30 tablet 1   nystatin cream (MYCOSTATIN) APPLY TO AFFECTED AREA TWICE A DAY 30 g 1   potassium chloride (KLOR-CON) 10 MEQ tablet Take 1 tablet (10 mEq total) by mouth 2 (two) times daily. 60 tablet 1   pravastatin (PRAVACHOL) 40 MG tablet Take 1 tablet (40 mg total) by mouth daily. 30 tablet 3   vitamin B-12 (CYANOCOBALAMIN) 1000 MCG tablet Take 1,000 mcg by mouth daily.     acetaminophen (TYLENOL) 325 MG tablet Take 650 mg by mouth every 6 (six) hours as needed. (Patient not taking: Reported on 12/30/2021)     feeding supplement, GLUCERNA SHAKE, (GLUCERNA SHAKE) LIQD Take 237 mLs by mouth 2 (two) times daily between meals. (Patient not taking: Reported on  01/26/2022) 237 mL 0   Current Facility-Administered Medications on File Prior to Visit  Medication Dose Route Frequency Provider Last Rate Last Admin   heparin lock flush 100 unit/mL  500 Units Intravenous Once Lloyd Huger, MD       sodium chloride flush (NS) 0.9 % injection 10 mL  10 mL Intravenous PRN Lloyd Huger, MD   10 mL at 02/01/18 0829   sodium chloride flush (NS) 0.9 % injection 10 mL  10 mL Intravenous PRN Lloyd Huger, MD   10 mL at 04/05/18 0800    There are no Patient Instructions on file for this visit. No follow-ups on file.   Kris Hartmann, NP

## 2022-03-19 ENCOUNTER — Encounter (INDEPENDENT_AMBULATORY_CARE_PROVIDER_SITE_OTHER): Payer: Self-pay | Admitting: Nurse Practitioner

## 2022-03-20 ENCOUNTER — Inpatient Hospital Stay: Payer: Medicare Other

## 2022-03-20 ENCOUNTER — Inpatient Hospital Stay: Payer: Medicare Other | Attending: Oncology

## 2022-03-20 DIAGNOSIS — Z452 Encounter for adjustment and management of vascular access device: Secondary | ICD-10-CM | POA: Diagnosis not present

## 2022-03-20 DIAGNOSIS — C8310 Mantle cell lymphoma, unspecified site: Secondary | ICD-10-CM | POA: Diagnosis not present

## 2022-03-20 DIAGNOSIS — Z95828 Presence of other vascular implants and grafts: Secondary | ICD-10-CM

## 2022-03-20 MED ORDER — HEPARIN SOD (PORK) LOCK FLUSH 100 UNIT/ML IV SOLN
500.0000 [IU] | Freq: Once | INTRAVENOUS | Status: AC
  Administered 2022-03-20: 500 [IU] via INTRAVENOUS
  Filled 2022-03-20: qty 5

## 2022-03-20 MED ORDER — SODIUM CHLORIDE 0.9% FLUSH
10.0000 mL | INTRAVENOUS | Status: DC | PRN
  Administered 2022-03-20: 10 mL via INTRAVENOUS
  Filled 2022-03-20: qty 10

## 2022-03-23 ENCOUNTER — Encounter: Payer: Self-pay | Admitting: Internal Medicine

## 2022-03-25 DIAGNOSIS — Z961 Presence of intraocular lens: Secondary | ICD-10-CM | POA: Diagnosis not present

## 2022-03-25 LAB — HM DIABETES EYE EXAM

## 2022-03-27 ENCOUNTER — Ambulatory Visit (INDEPENDENT_AMBULATORY_CARE_PROVIDER_SITE_OTHER): Payer: Medicare Other | Admitting: *Deleted

## 2022-03-27 DIAGNOSIS — I5022 Chronic systolic (congestive) heart failure: Secondary | ICD-10-CM

## 2022-03-27 DIAGNOSIS — I130 Hypertensive heart and chronic kidney disease with heart failure and stage 1 through stage 4 chronic kidney disease, or unspecified chronic kidney disease: Secondary | ICD-10-CM | POA: Diagnosis not present

## 2022-03-27 DIAGNOSIS — N1831 Chronic kidney disease, stage 3a: Secondary | ICD-10-CM | POA: Diagnosis not present

## 2022-03-27 DIAGNOSIS — E1022 Type 1 diabetes mellitus with diabetic chronic kidney disease: Secondary | ICD-10-CM | POA: Diagnosis not present

## 2022-03-27 DIAGNOSIS — E1151 Type 2 diabetes mellitus with diabetic peripheral angiopathy without gangrene: Secondary | ICD-10-CM

## 2022-03-27 DIAGNOSIS — E1051 Type 1 diabetes mellitus with diabetic peripheral angiopathy without gangrene: Secondary | ICD-10-CM | POA: Diagnosis not present

## 2022-03-27 DIAGNOSIS — I890 Lymphedema, not elsewhere classified: Secondary | ICD-10-CM | POA: Diagnosis not present

## 2022-03-27 NOTE — Chronic Care Management (AMB) (Signed)
Chronic Care Management   CCM RN Visit Note  03/27/2022 Name: Gary [name] MRN: [id number] DOB: 22-Nov-1945  Subjective: Gary [name] is a 76 y.o. year old male who is a primary care patient of [name], MD. The care management team was consulted for assistance with disease management and care coordination needs.    Engaged with patient by telephone for follow up visit in response to provider referral for case management and/or care coordination services.   Consent to Services:  The patient was given information about Chronic Care Management services, agreed to services, and gave verbal consent prior to initiation of services.  Please see initial visit note for detailed documentation.   Patient agreed to services and verbal consent obtained.   Assessment: Review of patient past medical history, allergies, medications, health status, including review of consultants reports, laboratory and other test data, was performed as part of comprehensive evaluation and provision of chronic care management services.   SDOH (Social Determinants of Health) assessments and interventions performed:    CCM Care Plan  Allergies  Allergen Reactions   Rofecoxib Nausea Only    Outpatient Encounter Medications as of 03/27/2022  Medication Sig   acetaminophen (TYLENOL) 325 MG tablet Take 650 mg by mouth every 6 (six) hours as needed. (Patient not taking: Reported on 12/30/2021)   bacitracin 500 UNIT/GM ointment Apply 1 application topically daily.   Cholecalciferol (VITAMIN D3) 50 MCG (2000 UT) CAPS Take 2,000 Units by mouth daily.   feeding supplement, GLUCERNA SHAKE, (GLUCERNA SHAKE) LIQD Take 237 mLs by mouth 2 (two) times daily between meals. (Patient not taking: Reported on 01/26/2022)   ferrous sulfate 325 (65 FE) MG tablet Take 325 mg by mouth daily with breakfast.   FLUoxetine (PROZAC) 20 MG capsule Take 1 capsule (20 mg total) by mouth daily.   furosemide (LASIX) 40 MG tablet Take 1 tablet (40  mg total) by mouth daily. (Patient taking differently: Take 80 mg by mouth 2 (two) times daily. Patient taking 80 MG in the AM and 40 mg in the PM.)   glucose 4 GM chewable tablet Chew 1 tablet (4 g total) by mouth once as needed for low blood sugar.   insulin aspart (NOVOLOG) 100 UNIT/ML injection Inject 6 Units into the skin 3 (three) times daily with meals. (Patient taking differently: Inject 80-100 Units into the skin once. INSULIN PUMP :Patient counts carb intake and calculates dose, with meals night time if CBG is less than 200 patient holds insulin. Patient taking 80-100 units daily.)   levothyroxine (SYNTHROID) 25 MCG tablet Take 1 tablet (25 mcg total) by mouth daily before breakfast.   nystatin cream (MYCOSTATIN) APPLY TO AFFECTED AREA TWICE A DAY   potassium chloride (KLOR-CON) 10 MEQ tablet Take 1 tablet (10 mEq total) by mouth 2 (two) times daily.   pravastatin (PRAVACHOL) 40 MG tablet Take 1 tablet (40 mg total) by mouth daily.   vitamin B-12 (CYANOCOBALAMIN) 1000 MCG tablet Take 1,000 mcg by mouth daily.   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    Left rib fracture_7th rib    Leukocytosis    HTN (hypertension)    Hypothyroid  CAD (coronary artery disease)    Porokeratosis 05/15/2021   Aortic atherosclerosis ([place]) 05/05/2021   Emphysema lung ([place]) 05/05/2021   Thrombocytopenia ([place]) 04/29/2021   Splinter of foot without infection 02/23/2021   Arm laceration 02/09/2021   Hypokalemia 10/24/2020   Injury by nail 08/15/2020   Shingles 07/24/2020   Rash 07/02/2020   Colon cancer screening 07/02/2020   Sick sinus syndrome ([place]) 12/28/2019   Acquired absence of kidney  09/13/2019   Edema of lower extremity 09/13/2019   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, or unspecified 09/13/2019   Proteinuria 09/13/2019   Lower extremity pain, bilateral    Diabetic foot ulcer associated with type 1 diabetes mellitus ([place])    Acute on chronic systolic CHF (congestive heart failure) ([place])    Cellulitis 08/18/2019   Lower limb ulcer, calf ([place]) 08/01/2019   Left hip pain 07/16/2019   Pain due to onychomycosis of toenails of both feet 04/03/2019   Swelling of limb 02/07/2019   CKD (chronic kidney disease) stage 3, GFR 30-59 ml/min (HCC) 09/16/2018   Left shoulder pain 09/16/2018   Chronic cholecystitis 12/29/2017   Graves disease 12/29/2017   Peripheral neuropathy 12/29/2017   S/p nephrectomy 12/29/2017   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   Symptomatic bradycardia 12/02/2017   CKD (chronic kidney disease), stage IIIa 38/25/0539   Chronic systolic CHF (congestive heart failure) ([place]) 12/02/2017   Saturday night paralysis 11/12/2017   Hypoglycemia 10/28/2017   Goals of care, counseling/discussion 10/24/2017   Lymphedema 10/20/2017   Iron deficiency anemia 09/11/2017   Fall 08/13/2017   Humerus fracture 08/13/2017   Anemia 01/12/2017   Bilateral carotid artery stenosis 06/29/2016   Hand laceration 12/16/2015   Adjustment disorder with depressed mood 08/11/2015   Cardiomyopathy, idiopathic ([place]) 04/22/2015   CAD in native artery 11/02/2014   Mantle cell lymphoma ([place]) 08/03/2014   History of colonic polyps 04/29/2014   Irregular heart beat 04/22/2014   SOB (shortness of breath) 04/22/2014   Stress 03/21/2014   PVC (premature ventricular contraction) 02/21/2014   GERD (gastroesophageal reflux disease) 10/23/2013   Gastroparesis 06/25/2013   Chest pain 04/13/2013   Thyroid disease 04/13/2013   Diabetes ([place]) 04/13/2013   Essential hypertension, benign 04/13/2013   Hypercholesterolemia 04/13/2013     Conditions to be addressed/monitored:CHF and DMII  Care Plan : RNCM  Diabetes  Updates made by Leona Singleton, RN since 03/27/2022 12:00 AM     Problem: Hypoglycemia causing syncopal episodes   Priority: Medium     Long-Range Goal: Patient will report maintaing Hgb A1C of 7 or below in the next 90 days   Start Date: 03/27/2022  Expected End Date: 03/26/2023  Recent Progress: Not on track  Priority: Medium  Note:   Objective:  Lab Results  Component Value Date   HGBA1C 7.0 01/23/2021  Current Barriers:  Knowledge Deficits related to basic Diabetes pathophysiology and self care/management as evidenced by multiple syncopal episodes related to hypoglycemia.  Limited Social Support   Trouble getting insulin pump supplies-reports he now has insulin pump sensors  Latest Hgb A1C 7.4 on 01/28/22.    2/27--States he feels pretty good.  Fasting blood sugar this morning was 86, lowest recently was 60's and average of 150's.  Has been using/eating donuts  to treat hypoglycemia.Marland Kitchen  RNCM still encouraging patient to contact Endocrinology.  Has new scale but not weighed today.  BP 135/852  3/22--  reports doing fair.  No increase in sob,  home health continues to do unna boots weekly.  Has not seen or spoken to endocrinology.  REPORTS A FEW HYPOGLYCEMIC EVENTS WITH RATES IN 60'S.  FASTING THIS MORNING WAS 86 WITH RECENT RATES 80-150'S.  BP 135/85.  Patient reports trying to arrange transportation to endocrinology appointment at [facility].  4/21--States he is doing well.  Attended appointment with Endocrinology with A1C of 7.4, no changes to insulin pump.  Fasting CBG this morning was 88, recent ranges were 58-400.  Weight this morning 219.6.  Denies chest pain, SOB, and states lower extremity edema is a little better.  Home health nurse continue to change unna boots.  Does acknowledge transportation concerns; reviewed updated protocol; encouraged to verify with [employer] transportation benefits, knows to  call provider office  5/22--reports he is beginning to feel better; last week he felt weak and wobbly walking.  Denies fall and says the feeling lasted about 48 hours, states his blood sugars were in the 150's at the time and BP 140/80.  Fells better, fasting CBG 180 today with recent ranges of 150-250's and a few hypoglycemia.  Weight has been between 225-230 pounds.  lower extremity swelling is better, SOB about the same and denies chest pains  6/16--Reports doing fair.  Reports some shortness of breath that has gotten better.  States LE swelling is better; continues to have unna boots changed by [facility].  Denies any recent falls.  Fasting CBG was 154 with recent ranges of 150, one episode of hypoglycemia in 50's and a few rebound hyperglycemic episodes of 200's.  Weight 236 pounds, states increase due to appetite.  Denies any chest pains or dizziness.  Awaiting to hear from transportation application.  Had eye exam 03/26/22.  Case Manager Clinical Goal(s):  patient will demonstrate improved adherence to prescribed treatment plan for diabetes self care/management as evidenced by: at least 4 times a day monitoring and recording of CBG, adherence to ADA/ carb modified diet, adherence to prescribed medication regimen, contacting provider for new or worsened symptoms or questions  Diabetes Interventions:  (Status: Goal on track: YES)  Long Term Goal  Collaboration with [name], MD regarding development and update of comprehensive plan of care as evidenced by provider attestation and co-signature Inter-disciplinary care team collaboration (see longitudinal plan of care) Provided education to patient about basic DM disease process Discussed medications and encouraged medication compliance Discussed plans with patient for ongoing care management follow up and provided patient with direct contact information for care management team Encouraged continued participation with CCM Pharmacist  Reviewed  glucose ranges and hypoglycemic events; and discussed dangers of hypoglycemia and treatment options, need for immediate sugar replacement with juice, sugar, soda in addition to snack or candy bar or glucose tablets Discussed proper treatment of hypoglycemia Discussed healthy meal and snack options Encouraged to continue working with Home Health Nursing  Discussed and encouraged patient to be proactive with health instead of reactive after falls Fall precautions and preventions reviewed and discussed, encouraged to use walker with all ambulation,  Instructed and encouraged to wear life alert at all times Discussed logging blood sugars and writing down insulin doses taken to help provider manage medications;  Reviewed and discussed proper diabetic foot care, encouraged patient to keep check on right foot splinter wound for signs and symptoms of infection Discussed and encouraged and reinforced patient to wear bedroom/house/slides while in the home and not to walk around bare foot Encouraged to contact pcp office for completed transportation application Congratulated on current Hgb A1C  Instructed to attend follow up appointment with PCP  Heart Failure Interventions:  (Status: Goal on track: NO.)  Long Term Goal  Basic overview and discussion of pathophysiology of Heart Failure reviewed Provided education on low sodium diet Reviewed Heart Failure Action Plan in depth and provided written copy Discussed importance of daily weight and advised patient to weigh and record daily Reviewed role of diuretics in prevention of fluid overload and management of heart failure Discussed the importance of keeping all appointments with provider Provided patient with education about the role of exercise in the management of heart failure  Encouraged to weigh self daily and discussed when to weigh and when to call provider based on weight Congratulated on weighing daily recently Fall precautions and  preventions  Patient Goals/Self-Care Activities Check blood sugar at least 5 times a day Check blood sugar if I feel it is too high or too low Take the blood sugar meter and log to all doctor visits  Consider drinking Boost or other supplement in the evenings if not eating a complete meal Make sure to have candy or glucose tablets on you at all times; try drinking coke along with eating snack to treat hypoglycemia Eat at least 3 meals a day with snack in between to help prevent hypoglycemia Fall precautions and preventions; Wear life alert at all times Contact Endocrinology for SUSTAINED hypoglycemic episodes Weigh self daily using walker to balance; request help when putting new batteries in scale Low salt carb modified diet Attend appointment with PCP  Follow Up Plan: The care management team will reach out to the patient again over the next 45  business days.        Plan:The care management team will reach out to the patient again over the next 45 days.  [name] RN, MSN RN Care Management Coordinator [place] [phone] [email or web address]

## 2022-03-27 NOTE — Patient Instructions (Addendum)
Visit Information  Thank you for taking time to visit with me today. Please don't hesitate to contact me if I can be of assistance to you before our next scheduled telephone appointment.  Following are the goals we discussed today:  Check blood sugar at least 5 times a day Check blood sugar if I feel it is too high or too low Take the blood sugar meter and log to all doctor visits  Consider drinking Boost or other supplement in the evenings if not eating a complete meal Make sure to have candy or glucose tablets on you at all times; try drinking coke along with eating snack to treat hypoglycemia Eat at least 3 meals a day with snack in between to help prevent hypoglycemia Fall precautions and preventions; Wear life alert at all times Contact Endocrinology for SUSTAINED hypoglycemic episodes Weigh self daily using walker to balance; request help when putting new batteries in scale Low salt carb modified diet Attend appointment with PCP  Our next appointment is by telephone on 7/12 at 1030  Please call the care guide team at [phone] if you need to cancel or reschedule your appointment.   If you are experiencing a Mental Health or [place] or need someone to talk to, please call the Suicide and Crisis Lifeline: 988 call the [place] National Suicide Prevention Lifeline: (905) 063-9888 or TTY: 5754639440 TTY 765-762-2603) to talk to a trained counselor call 1-800-273-TALK (toll free, 24 hour hotline) call 911   Patient verbalizes understanding of instructions and care plan provided today and agrees to view in [place]. Active MyChart status and patient understanding of how to access instructions and care plan via MyChart confirmed with patient.     [name] RN, MSN RN Care Management Coordinator [place] [phone] [name]

## 2022-03-29 DIAGNOSIS — I130 Hypertensive heart and chronic kidney disease with heart failure and stage 1 through stage 4 chronic kidney disease, or unspecified chronic kidney disease: Secondary | ICD-10-CM | POA: Diagnosis not present

## 2022-03-29 DIAGNOSIS — Z950 Presence of cardiac pacemaker: Secondary | ICD-10-CM | POA: Diagnosis not present

## 2022-03-29 DIAGNOSIS — E039 Hypothyroidism, unspecified: Secondary | ICD-10-CM | POA: Diagnosis not present

## 2022-03-29 DIAGNOSIS — H04123 Dry eye syndrome of bilateral lacrimal glands: Secondary | ICD-10-CM | POA: Diagnosis not present

## 2022-03-29 DIAGNOSIS — E1043 Type 1 diabetes mellitus with diabetic autonomic (poly)neuropathy: Secondary | ICD-10-CM | POA: Diagnosis not present

## 2022-03-29 DIAGNOSIS — E1051 Type 1 diabetes mellitus with diabetic peripheral angiopathy without gangrene: Secondary | ICD-10-CM | POA: Diagnosis not present

## 2022-03-29 DIAGNOSIS — Z9181 History of falling: Secondary | ICD-10-CM | POA: Diagnosis not present

## 2022-03-29 DIAGNOSIS — E1022 Type 1 diabetes mellitus with diabetic chronic kidney disease: Secondary | ICD-10-CM | POA: Diagnosis not present

## 2022-03-29 DIAGNOSIS — K5900 Constipation, unspecified: Secondary | ICD-10-CM | POA: Diagnosis not present

## 2022-03-29 DIAGNOSIS — Z8572 Personal history of non-Hodgkin lymphomas: Secondary | ICD-10-CM | POA: Diagnosis not present

## 2022-03-29 DIAGNOSIS — I2510 Atherosclerotic heart disease of native coronary artery without angina pectoris: Secondary | ICD-10-CM | POA: Diagnosis not present

## 2022-03-29 DIAGNOSIS — Z95828 Presence of other vascular implants and grafts: Secondary | ICD-10-CM | POA: Diagnosis not present

## 2022-03-29 DIAGNOSIS — I890 Lymphedema, not elsewhere classified: Secondary | ICD-10-CM | POA: Diagnosis not present

## 2022-03-29 DIAGNOSIS — D509 Iron deficiency anemia, unspecified: Secondary | ICD-10-CM | POA: Diagnosis not present

## 2022-03-29 DIAGNOSIS — I5022 Chronic systolic (congestive) heart failure: Secondary | ICD-10-CM | POA: Diagnosis not present

## 2022-03-29 DIAGNOSIS — Z4800 Encounter for change or removal of nonsurgical wound dressing: Secondary | ICD-10-CM | POA: Diagnosis not present

## 2022-03-29 DIAGNOSIS — J439 Emphysema, unspecified: Secondary | ICD-10-CM | POA: Diagnosis not present

## 2022-03-29 DIAGNOSIS — E103519 Type 1 diabetes mellitus with proliferative diabetic retinopathy with macular edema, unspecified eye: Secondary | ICD-10-CM | POA: Diagnosis not present

## 2022-03-29 DIAGNOSIS — E785 Hyperlipidemia, unspecified: Secondary | ICD-10-CM | POA: Diagnosis not present

## 2022-03-29 DIAGNOSIS — F32A Depression, unspecified: Secondary | ICD-10-CM | POA: Diagnosis not present

## 2022-03-29 DIAGNOSIS — K3184 Gastroparesis: Secondary | ICD-10-CM | POA: Diagnosis not present

## 2022-03-29 DIAGNOSIS — N1831 Chronic kidney disease, stage 3a: Secondary | ICD-10-CM | POA: Diagnosis not present

## 2022-03-29 DIAGNOSIS — I872 Venous insufficiency (chronic) (peripheral): Secondary | ICD-10-CM | POA: Diagnosis not present

## 2022-04-03 DIAGNOSIS — Z4800 Encounter for change or removal of nonsurgical wound dressing: Secondary | ICD-10-CM | POA: Diagnosis not present

## 2022-04-03 DIAGNOSIS — N1831 Chronic kidney disease, stage 3a: Secondary | ICD-10-CM | POA: Diagnosis not present

## 2022-04-03 DIAGNOSIS — I130 Hypertensive heart and chronic kidney disease with heart failure and stage 1 through stage 4 chronic kidney disease, or unspecified chronic kidney disease: Secondary | ICD-10-CM | POA: Diagnosis not present

## 2022-04-03 DIAGNOSIS — E1022 Type 1 diabetes mellitus with diabetic chronic kidney disease: Secondary | ICD-10-CM | POA: Diagnosis not present

## 2022-04-03 DIAGNOSIS — I890 Lymphedema, not elsewhere classified: Secondary | ICD-10-CM | POA: Diagnosis not present

## 2022-04-03 DIAGNOSIS — I5022 Chronic systolic (congestive) heart failure: Secondary | ICD-10-CM | POA: Diagnosis not present

## 2022-04-07 ENCOUNTER — Encounter (INDEPENDENT_AMBULATORY_CARE_PROVIDER_SITE_OTHER): Payer: Self-pay | Admitting: Nurse Practitioner

## 2022-04-07 ENCOUNTER — Ambulatory Visit (INDEPENDENT_AMBULATORY_CARE_PROVIDER_SITE_OTHER): Payer: Medicare Other | Admitting: Nurse Practitioner

## 2022-04-07 VITALS — BP 134/74 | HR 99 | Resp 18 | Ht 71.0 in | Wt 244.0 lb

## 2022-04-07 DIAGNOSIS — I890 Lymphedema, not elsewhere classified: Secondary | ICD-10-CM | POA: Diagnosis not present

## 2022-04-07 DIAGNOSIS — E1151 Type 2 diabetes mellitus with diabetic peripheral angiopathy without gangrene: Secondary | ICD-10-CM

## 2022-04-07 DIAGNOSIS — I10 Essential (primary) hypertension: Secondary | ICD-10-CM | POA: Diagnosis not present

## 2022-04-09 ENCOUNTER — Other Ambulatory Visit: Payer: Self-pay | Admitting: Internal Medicine

## 2022-04-10 DIAGNOSIS — Z794 Long term (current) use of insulin: Secondary | ICD-10-CM

## 2022-04-10 DIAGNOSIS — I509 Heart failure, unspecified: Secondary | ICD-10-CM | POA: Diagnosis not present

## 2022-04-10 DIAGNOSIS — E1159 Type 2 diabetes mellitus with other circulatory complications: Secondary | ICD-10-CM | POA: Diagnosis not present

## 2022-04-13 ENCOUNTER — Ambulatory Visit (INDEPENDENT_AMBULATORY_CARE_PROVIDER_SITE_OTHER): Payer: Medicare Other | Admitting: Internal Medicine

## 2022-04-13 ENCOUNTER — Encounter: Payer: Self-pay | Admitting: Internal Medicine

## 2022-04-13 DIAGNOSIS — I10 Essential (primary) hypertension: Secondary | ICD-10-CM

## 2022-04-13 NOTE — Telephone Encounter (Signed)
Patient called to state that he would like to apologize for not making it to his appointment with Dr. [name] today.  Patient states his transportation did not show up.  Patient has been rescheduled for 05/11/2022.  Patient states he would like for [place] to arrange transportation for him to this appointment if possible.

## 2022-04-15 DIAGNOSIS — Z950 Presence of cardiac pacemaker: Secondary | ICD-10-CM

## 2022-04-15 DIAGNOSIS — N1831 Chronic kidney disease, stage 3a: Secondary | ICD-10-CM | POA: Diagnosis not present

## 2022-04-15 DIAGNOSIS — E103519 Type 1 diabetes mellitus with proliferative diabetic retinopathy with macular edema, unspecified eye: Secondary | ICD-10-CM

## 2022-04-15 DIAGNOSIS — K5900 Constipation, unspecified: Secondary | ICD-10-CM

## 2022-04-15 DIAGNOSIS — Z9181 History of falling: Secondary | ICD-10-CM

## 2022-04-15 DIAGNOSIS — I2510 Atherosclerotic heart disease of native coronary artery without angina pectoris: Secondary | ICD-10-CM | POA: Diagnosis not present

## 2022-04-15 DIAGNOSIS — F32A Depression, unspecified: Secondary | ICD-10-CM

## 2022-04-15 DIAGNOSIS — E039 Hypothyroidism, unspecified: Secondary | ICD-10-CM

## 2022-04-15 DIAGNOSIS — E785 Hyperlipidemia, unspecified: Secondary | ICD-10-CM

## 2022-04-15 DIAGNOSIS — H04123 Dry eye syndrome of bilateral lacrimal glands: Secondary | ICD-10-CM

## 2022-04-15 DIAGNOSIS — J439 Emphysema, unspecified: Secondary | ICD-10-CM | POA: Diagnosis not present

## 2022-04-15 DIAGNOSIS — Z8572 Personal history of non-Hodgkin lymphomas: Secondary | ICD-10-CM

## 2022-04-15 DIAGNOSIS — I890 Lymphedema, not elsewhere classified: Secondary | ICD-10-CM | POA: Diagnosis not present

## 2022-04-15 DIAGNOSIS — D509 Iron deficiency anemia, unspecified: Secondary | ICD-10-CM | POA: Diagnosis not present

## 2022-04-15 DIAGNOSIS — Z4800 Encounter for change or removal of nonsurgical wound dressing: Secondary | ICD-10-CM | POA: Diagnosis not present

## 2022-04-15 DIAGNOSIS — I872 Venous insufficiency (chronic) (peripheral): Secondary | ICD-10-CM | POA: Diagnosis not present

## 2022-04-15 DIAGNOSIS — I5022 Chronic systolic (congestive) heart failure: Secondary | ICD-10-CM | POA: Diagnosis not present

## 2022-04-15 DIAGNOSIS — E1043 Type 1 diabetes mellitus with diabetic autonomic (poly)neuropathy: Secondary | ICD-10-CM | POA: Diagnosis not present

## 2022-04-15 DIAGNOSIS — E1051 Type 1 diabetes mellitus with diabetic peripheral angiopathy without gangrene: Secondary | ICD-10-CM | POA: Diagnosis not present

## 2022-04-15 DIAGNOSIS — I130 Hypertensive heart and chronic kidney disease with heart failure and stage 1 through stage 4 chronic kidney disease, or unspecified chronic kidney disease: Secondary | ICD-10-CM | POA: Diagnosis not present

## 2022-04-15 DIAGNOSIS — Z95828 Presence of other vascular implants and grafts: Secondary | ICD-10-CM

## 2022-04-15 DIAGNOSIS — E1022 Type 1 diabetes mellitus with diabetic chronic kidney disease: Secondary | ICD-10-CM | POA: Diagnosis not present

## 2022-04-15 DIAGNOSIS — K3184 Gastroparesis: Secondary | ICD-10-CM

## 2022-04-18 DIAGNOSIS — I130 Hypertensive heart and chronic kidney disease with heart failure and stage 1 through stage 4 chronic kidney disease, or unspecified chronic kidney disease: Secondary | ICD-10-CM | POA: Diagnosis not present

## 2022-04-18 DIAGNOSIS — E1022 Type 1 diabetes mellitus with diabetic chronic kidney disease: Secondary | ICD-10-CM | POA: Diagnosis not present

## 2022-04-18 DIAGNOSIS — Z4800 Encounter for change or removal of nonsurgical wound dressing: Secondary | ICD-10-CM | POA: Diagnosis not present

## 2022-04-18 DIAGNOSIS — N1831 Chronic kidney disease, stage 3a: Secondary | ICD-10-CM | POA: Diagnosis not present

## 2022-04-18 DIAGNOSIS — I890 Lymphedema, not elsewhere classified: Secondary | ICD-10-CM | POA: Diagnosis not present

## 2022-04-18 DIAGNOSIS — I5022 Chronic systolic (congestive) heart failure: Secondary | ICD-10-CM | POA: Diagnosis not present

## 2022-04-19 ENCOUNTER — Encounter: Payer: Self-pay | Admitting: Internal Medicine

## 2022-04-19 ENCOUNTER — Encounter (INDEPENDENT_AMBULATORY_CARE_PROVIDER_SITE_OTHER): Payer: Self-pay | Admitting: Nurse Practitioner

## 2022-04-19 NOTE — Progress Notes (Signed)
Pt did not show for appt.  Was not seen.

## 2022-04-19 NOTE — Progress Notes (Signed)
Subjective:    [name], male    DOB: 12-20-1945, 76 y.o.   MRN: [id number] Chief Complaint  Patient presents with   Follow-up    [name] boot check    [name] is a 76 year old male who presents today for follow-up evaluation of lower extremity edema following Unna wraps done by home health.  The patient is tolerating the wraps well.  There are some worsening swelling in areas where the wire was removed due to being too tight but overall is doing well.    Review of Systems  Cardiovascular:  Positive for leg swelling.  Neurological:  Positive for weakness.  All other systems reviewed and are negative.      Objective:   Physical Exam Vitals reviewed.  HENT:     Head: Normocephalic.  Cardiovascular:     Rate and Rhythm: Normal rate.  Pulmonary:     Effort: Pulmonary effort is normal.  Musculoskeletal:     Right lower leg: Edema present.     Left lower leg: Edema present.  Skin:    General: Skin is warm and dry.     Capillary Refill: Capillary refill takes 2 to 3 seconds.  Neurological:     Mental Status: He is alert and oriented to person, place, and time.  Psychiatric:        Mood and Affect: Mood normal.        Behavior: Behavior normal.        Thought Content: Thought content normal.        Judgment: Judgment normal.     BP 134/74 (BP Location: Left Arm)   Pulse 99   Resp 18   Ht '5\' 11"'$  (1.803 m)   Wt 244 lb (110.7 kg)   BMI 34.03 kg/m   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    Gastroparesis    Graves disease    Hypercholesterolemia    Hypertension    Mantle cell lymphoma ([place]) 07/2014   Peripheral neuropathy    Shingles     Social History   Socioeconomic History   Marital status: Widowed    Spouse name: Not on file   Number of children: 1   Years of education: 28   Highest  education level: 12th grade  Occupational History   Occupation: Retired  Tobacco Use   Smoking status: Former   Smokeless tobacco: Current    Types: Nurse, children's Use: Never used  Substance and Sexual Activity   Alcohol use: Yes    Alcohol/week: 3.0 standard drinks of alcohol    Types: 3 Cans of beer per week    Comment: nightly   Drug use: Never   Sexual activity: Not Currently  Other Topics Concern   Not on file  Social History Narrative   Not on file   Social Determinants of Health   Financial Resource Strain: Low Risk  (03/10/2022)   Overall Financial Resource Strain (CARDIA)    Difficulty of Paying Living Expenses: Not hard at all  Food Insecurity: No Food Insecurity (03/10/2022)   Hunger Vital Sign    Worried About Running Out of Food in the Last Year: Never true    Ran Out of Food in the Last Year: Never true  Transportation Needs: Unmet Transportation Needs (04/09/2022)   PRAPARE -  Hydrologist (Medical): Yes    Lack of Transportation (Non-Medical): Yes  Physical Activity: Inactive (03/10/2022)   Exercise Vital Sign    Days of Exercise per Week: 0 days    Minutes of Exercise per Session: 0 min  Stress: No Stress Concern Present (03/10/2022)   [place]    Feeling of Stress : Only a little  Social Connections: Socially Isolated (03/10/2022)   Social Connection and Isolation Panel [NHANES]    Frequency of Communication with Friends and Family: More than three times a week    Frequency of Social Gatherings with Friends and Family: Twice a week    Attends Religious Services: Never    Marine scientist or Organizations: No    Attends Archivist Meetings: Never    Marital Status: Widowed  Intimate Partner Violence: Not At Risk (03/10/2022)   Humiliation, Afraid, Rape, and Kick questionnaire    Fear of Current or Ex-Partner: No    Emotionally Abused:  No    Physically Abused: No    Sexually Abused: No    Past Surgical History:  Procedure Laterality Date   CHOLECYSTECTOMY     NEPHRECTOMY     right   PACEMAKER INSERTION N/A 12/07/2017   Procedure: INSERTION PACEMAKER DUAL CHAMBER INITIAL INSERT;  Surgeon: Isaias Cowman, MD;  Location: [facility];  Service: Cardiovascular;  Laterality: N/A;   PORTA CATH INSERTION N/A 11/03/2017   Procedure: PORTA CATH INSERTION;  Surgeon: Katha Cabal, MD;  Location: [facility];  Service: Cardiovascular;  Laterality: N/A;    Family History  Problem Relation Age of Onset   Breast cancer Mother    Diabetes Father    Cancer Father        Lymphoma   Alcohol abuse Maternal Uncle    Diabetes Sister    Heart disease Other        grandfather    Allergies  Allergen Reactions   Rofecoxib Nausea Only       Latest Ref Rng & Units 10/17/2021    4:24 PM 09/13/2021    6:08 AM 09/07/2021    5:04 AM  CBC  WBC 3.8 - 10.8 Thousand/uL 5.2  2.8  2.9   Hemoglobin 13.2 - 17.1 g/dL 13.1  13.5  14.2   Hematocrit 38.5 - 50.0 % 39.5  40.8  42.9   Platelets 140 - 400 Thousand/uL 149  122  103       CMP     Component Value Date/Time   NA 139 12/17/2021 1041   NA 138 07/15/2014 0515   K 3.9 12/17/2021 1041   K 3.5 01/16/2015 1429   CL 101 12/17/2021 1041   CL 110 (H) 07/15/2014 0515   CO2 28 12/17/2021 1041   CO2 24 07/15/2014 0515   GLUCOSE 132 (H) 12/17/2021 1041   GLUCOSE 145 (H) 07/15/2014 0515   BUN 30 (H) 12/17/2021 1041   BUN 17 07/15/2014 0515   CREATININE 1.36 12/17/2021 1041   CREATININE 1.38 (H) 10/17/2021 1624   CALCIUM 8.4 12/17/2021 1041   CALCIUM 7.1 (L) 07/15/2014 0515   PROT 6.7 01/19/2022 1059   PROT 6.2 (L) 10/22/2014 1000   ALBUMIN 4.2 01/19/2022 1059   ALBUMIN 2.6 (L) 10/22/2014 1000   AST 14 01/19/2022 1059   AST 19 10/22/2014 1000   ALT 8 01/19/2022 1059   ALT 21 10/22/2014 1000   ALKPHOS 126 (H) 01/19/2022  1059   ALKPHOS 118 (H) 10/22/2014 1000    BILITOT 0.5 01/19/2022 1059   BILITOT 0.8 10/22/2014 1000   GFRNONAA 48 (L) 09/13/2021 0608   GFRNONAA >60 01/16/2015 1429   GFRAA 41 (L) 01/24/2020 0925   GFRAA >60 01/16/2015 1429     No results found.     Assessment & Plan:   1. Lymphedema The patient's lower extremity edema remains mostly under control.  There are some small ulcerative areas due to rolling down of his Unna boots.  Patient will remain in Unna boots at this time.  He will continue with home health.  We will see him back in 4 weeks.  2. Primary hypertension Continue antihypertensive medications as already ordered, these medications have been reviewed and there are no changes at this time.   3. Type 2 diabetes mellitus with diabetic peripheral angiopathy without gangrene, without long-term current use of insulin (HCC) Continue hypoglycemic medications as already ordered, these medications have been reviewed and there are no changes at this time.  Hgb A1C to be monitored as already arranged by primary service    Current Outpatient Medications on File Prior to Visit  Medication Sig Dispense Refill   bacitracin 500 UNIT/GM ointment Apply 1 application topically daily.     Cholecalciferol (VITAMIN D3) 50 MCG (2000 UT) CAPS Take 2,000 Units by mouth daily.     feeding supplement, GLUCERNA SHAKE, (GLUCERNA SHAKE) LIQD Take 237 mLs by mouth 2 (two) times daily between meals. 237 mL 0   ferrous sulfate 325 (65 FE) MG tablet Take 325 mg by mouth daily with breakfast.     furosemide (LASIX) 40 MG tablet Take 1 tablet (40 mg total) by mouth daily. (Patient taking differently: Take 80 mg by mouth 2 (two) times daily. Patient taking 80 MG in the AM and 40 mg in the PM.) 30 tablet    glucose 4 GM chewable tablet Chew 1 tablet (4 g total) by mouth once as needed for low blood sugar. 50 tablet 0   insulin aspart (NOVOLOG) 100 UNIT/ML injection Inject 6 Units into the skin 3 (three) times daily with meals. (Patient taking  differently: Inject 80-100 Units into the skin once. INSULIN PUMP :Patient counts carb intake and calculates dose, with meals night time if CBG is less than 200 patient holds insulin. Patient taking 80-100 units daily.)     levothyroxine (SYNTHROID) 25 MCG tablet Take 1 tablet (25 mcg total) by mouth daily before breakfast. 30 tablet 1   nystatin cream (MYCOSTATIN) APPLY TO AFFECTED AREA TWICE A DAY 30 g 1   pravastatin (PRAVACHOL) 40 MG tablet Take 1 tablet (40 mg total) by mouth daily. 30 tablet 3   vitamin B-12 (CYANOCOBALAMIN) 1000 MCG tablet Take 1,000 mcg by mouth daily.     acetaminophen (TYLENOL) 325 MG tablet Take 650 mg by mouth every 6 (six) hours as needed. (Patient not taking: Reported on 12/30/2021)     FLUoxetine (PROZAC) 20 MG capsule Take 1 capsule (20 mg total) by mouth daily. (Patient not taking: Reported on 04/07/2022) 90 capsule 3   Current Facility-Administered Medications on File Prior to Visit  Medication Dose Route Frequency Provider Last Rate Last Admin   heparin lock flush 100 unit/mL  500 Units Intravenous Once Lloyd Huger, MD       sodium chloride flush (NS) 0.9 % injection 10 mL  10 mL Intravenous PRN Lloyd Huger, MD   10 mL at 02/01/18 0829   sodium chloride  flush (NS) 0.9 % injection 10 mL  10 mL Intravenous PRN Lloyd Huger, MD   10 mL at 04/05/18 0800    There are no Patient Instructions on file for this visit. No follow-ups on file.   Kris Hartmann, NP

## 2022-04-22 ENCOUNTER — Ambulatory Visit (INDEPENDENT_AMBULATORY_CARE_PROVIDER_SITE_OTHER): Payer: Medicare Other | Admitting: *Deleted

## 2022-04-22 DIAGNOSIS — I10 Essential (primary) hypertension: Secondary | ICD-10-CM

## 2022-04-22 DIAGNOSIS — E1151 Type 2 diabetes mellitus with diabetic peripheral angiopathy without gangrene: Secondary | ICD-10-CM

## 2022-04-22 DIAGNOSIS — I5022 Chronic systolic (congestive) heart failure: Secondary | ICD-10-CM

## 2022-04-22 NOTE — Patient Instructions (Signed)
Visit Information  Thank you for taking time to visit with me today. Please don't hesitate to contact me if I can be of assistance to you before our next scheduled telephone appointment.  Following are the goals we discussed today:  Check blood sugar at least 5 times a day Check blood sugar if I feel it is too high or too low Take the blood sugar meter and log to all doctor visits  Consider drinking Boost or other supplement in the evenings if not eating a complete meal Make sure to have candy or glucose tablets on you at all times; try drinking coke along with eating snack to treat hypoglycemia Eat at least 3 meals a day with snack in between to help prevent hypoglycemia Fall precautions and preventions; Wear life alert at all times Contact Endocrinology for SUSTAINED hypoglycemic episodes Weigh self daily using walker to balance; request help when putting new batteries in scale Low salt carb modified diet Attend appointment with PCP  Our next appointment is by telephone on 7/28 at 1030  Please call the care guide team at [phone] if you need to cancel or reschedule your appointment.   If you are experiencing a Mental Health or [place] or need someone to talk to, please call the Suicide and Crisis Lifeline: 988 call the [place] National Suicide Prevention Lifeline: 618-082-6894 or TTY: 347-090-7419 TTY (682) 374-9441) to talk to a trained counselor call 1-800-273-TALK (toll free, 24 hour hotline) call 911   Patient verbalizes understanding of instructions and care plan provided today and agrees to view in [facility]. Active MyChart status and patient understanding of how to access instructions and care plan via MyChart confirmed with patient.     [name] RN, MSN RN Care Management Coordinator [place] [phone] [email or web address]

## 2022-04-22 NOTE — Chronic Care Management (AMB) (Signed)
Chronic Care Management   CCM RN Visit Note  04/22/2022 Name: Gary [name] MRN: [id number] DOB: Sep 25, 1946  Subjective: Gary [name] is a 76 y.o. year old male who is a primary care patient of [name], MD. The care management team was consulted for assistance with disease management and care coordination needs.    Engaged with patient by telephone for follow up visit in response to provider referral for case management and/or care coordination services.   Consent to Services:  The patient was given information about Chronic Care Management services, agreed to services, and gave verbal consent prior to initiation of services.  Please see initial visit note for detailed documentation.   Patient agreed to services and verbal consent obtained.   Assessment: Review of patient past medical history, allergies, medications, health status, including review of consultants reports, laboratory and other test data, was performed as part of comprehensive evaluation and provision of chronic care management services.   SDOH (Social Determinants of Health) assessments and interventions performed:    CCM Care Plan  Allergies  Allergen Reactions   Rofecoxib Nausea Only    Outpatient Encounter Medications as of 04/22/2022  Medication Sig   bacitracin 500 UNIT/GM ointment Apply 1 application topically daily.   Cholecalciferol (VITAMIN D3) 50 MCG (2000 UT) CAPS Take 2,000 Units by mouth daily.   feeding supplement, GLUCERNA SHAKE, (GLUCERNA SHAKE) LIQD Take 237 mLs by mouth 2 (two) times daily between meals.   ferrous sulfate 325 (65 FE) MG tablet Take 325 mg by mouth daily with breakfast.   FLUoxetine (PROZAC) 20 MG capsule Take 1 capsule (20 mg total) by mouth daily. (Patient not taking: Reported on 04/07/2022)   furosemide (LASIX) 40 MG tablet Take 1 tablet (40 mg total) by mouth daily. (Patient taking differently: Take 80 mg by mouth 2 (two) times daily. Patient taking 80 MG in the AM and 40 mg  in the PM.)   glucose 4 GM chewable tablet Chew 1 tablet (4 g total) by mouth once as needed for low blood sugar.   insulin aspart (NOVOLOG) 100 UNIT/ML injection Inject 6 Units into the skin 3 (three) times daily with meals. (Patient taking differently: Inject 80-100 Units into the skin once. INSULIN PUMP :Patient counts carb intake and calculates dose, with meals night time if CBG is less than 200 patient holds insulin. Patient taking 80-100 units daily.)   levothyroxine (SYNTHROID) 25 MCG tablet Take 1 tablet (25 mcg total) by mouth daily before breakfast.   nystatin cream (MYCOSTATIN) APPLY TO AFFECTED AREA TWICE A DAY   potassium chloride (KLOR-CON) 10 MEQ tablet TAKE 1 TABLET BY MOUTH 2 TIMES DAILY.   pravastatin (PRAVACHOL) 40 MG tablet Take 1 tablet (40 mg total) by mouth daily.   vitamin B-12 (CYANOCOBALAMIN) 1000 MCG tablet Take 1,000 mcg by mouth daily.   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    Left rib fracture_7th rib    Leukocytosis    HTN (hypertension)    Hypothyroid    CAD (coronary artery disease)    Porokeratosis 05/15/2021   Aortic atherosclerosis ([place]) 05/05/2021   Emphysema lung ([place]) 05/05/2021   Thrombocytopenia ([place]) 04/29/2021  Splinter of foot without infection 02/23/2021   Arm laceration 02/09/2021   Hypokalemia 10/24/2020   Injury by nail 08/15/2020   Shingles 07/24/2020   Rash 07/02/2020   Colon cancer screening 07/02/2020   Sick sinus syndrome ([place]) 12/28/2019   Acquired absence of kidney 09/13/2019   Edema of lower extremity 09/13/2019   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, or unspecified  09/13/2019   Proteinuria 09/13/2019   Lower extremity pain, bilateral    Diabetic foot ulcer associated with type 1 diabetes mellitus ([place])    Acute on chronic systolic CHF (congestive heart failure) ([place])    Cellulitis 08/18/2019   Lower limb ulcer, calf ([place]) 08/01/2019   Left hip pain 07/16/2019   Pain due to onychomycosis of toenails of both feet 04/03/2019   Swelling of limb 02/07/2019   CKD (chronic kidney disease) stage 3, GFR 30-59 ml/min (HCC) 09/16/2018   Left shoulder pain 09/16/2018   Chronic cholecystitis 12/29/2017   Graves disease 12/29/2017   Peripheral neuropathy 12/29/2017   S/p nephrectomy 12/29/2017   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   Symptomatic bradycardia 12/02/2017   CKD (chronic kidney disease), stage IIIa 11/94/1740   Chronic systolic CHF (congestive heart failure) ([place]) 12/02/2017   Saturday night paralysis 11/12/2017   Hypoglycemia 10/28/2017   Goals of care, counseling/discussion 10/24/2017   Lymphedema 10/20/2017   Iron deficiency anemia 09/11/2017   Fall 08/13/2017   Humerus fracture 08/13/2017   Anemia 01/12/2017   Bilateral carotid artery stenosis 06/29/2016   Hand laceration 12/16/2015   Adjustment disorder with depressed mood 08/11/2015   Cardiomyopathy, idiopathic ([place]) 04/22/2015   CAD in native artery 11/02/2014   Mantle cell lymphoma ([place]) 08/03/2014   History of colonic polyps 04/29/2014   Irregular heart beat 04/22/2014   SOB (shortness of breath) 04/22/2014   Stress 03/21/2014   PVC (premature ventricular contraction) 02/21/2014   GERD (gastroesophageal reflux disease) 10/23/2013   Gastroparesis 06/25/2013   Chest pain 04/13/2013   Thyroid disease 04/13/2013   Diabetes ([place]) 04/13/2013   Essential hypertension, benign 04/13/2013   Hypercholesterolemia 04/13/2013   Constipation, unspecified 10/13/1999   Depression, unspecified 10/13/1999   Dry eye syndrome of bilateral lacrimal glands 10/13/1999   Chronic venous  insufficiency 10/13/1999   Athscl heart disease of native coronary artery w/o ang pctrs 10/13/1999    Conditions to be addressed/monitored:CHF, HTN, and DMII  Care Plan : RNCM  Diabetes  Updates made by Leona Singleton, RN since 04/22/2022 12:00 AM     Problem: Hypoglycemia causing syncopal episodes   Priority: Medium     Long-Range Goal: Patient will report maintaing Hgb A1C of 7 or below in the next 90 days   Start Date: 03/27/2022  Expected End Date: 03/26/2023  Recent Progress: Not on track  Priority: Medium  Note:   Objective:  Lab Results  Component Value Date   HGBA1C 7.0 01/23/2021  Current Barriers: Knowledge Deficits related to basic Diabetes pathophysiology and self care/management as evidenced by multiple syncopal episodes related to hypoglycemia.  Limited Social Support   Trouble getting insulin pump supplies-reports he now has insulin pump sensors  Latest Hgb A1C 7.4 on 01/28/22.    2/27--States he feels pretty good.  Fasting blood sugar this morning was 86, lowest recently was 60's and average of 150's.  Has been using/eating donuts  to treat hypoglycemia.Marland Kitchen  RNCM still encouraging patient to contact Endocrinology.  Has new scale but not weighed today.  BP 135/852  3/22--  reports doing fair.  No increase in sob, home health continues to do unna boots weekly.  Has not seen or spoken to endocrinology.  REPORTS A FEW HYPOGLYCEMIC EVENTS WITH RATES IN 60'S.  FASTING THIS MORNING WAS 86 WITH RECENT RATES 80-150'S.  BP 135/85.  Patient reports trying to arrange transportation to endocrinology appointment at [facility].  4/21--States he is doing well.  Attended appointment with Endocrinology with A1C of 7.4, no changes to insulin pump.  Fasting CBG this morning was 88, recent ranges were 58-400.  Weight this morning 219.6.  Denies chest pain, SOB, and states lower extremity edema is a little better.  Home health nurse continue to change unna boots.  Does acknowledge transportation  concerns; reviewed updated protocol; encouraged to verify with [employer] transportation benefits, knows to call provider office  5/22--reports he is beginning to feel better; last week he felt weak and wobbly walking.  Denies fall and says the feeling lasted about 48 hours, states his blood sugars were in the 150's at the time and BP 140/80.  Fells better, fasting CBG 180 today with recent ranges of 150-250's and a few hypoglycemia.  Weight has been between 225-230 pounds.  lower extremity swelling is better, SOB about the same and denies chest pains  6/16--Reports doing fair.  Reports some shortness of breath that has gotten better.  States LE swelling is better; continues to have unna boots changed by [facility].  Denies any recent falls.  Fasting CBG was 154 with recent ranges of 150, one episode of hypoglycemia in 50's and a few rebound hyperglycemic episodes of 200's.  Weight 236 pounds, states increase due to appetite.  Denies any chest pains or dizziness.  Awaiting to hear from transportation application.  Had eye exam 03/26/22.  7/12--Reports he is feeling better after being ill with stomach bug last week.  States he is able to keep food down at this time.  Fasting CBG was 140 with one extreme low in 40's having to turn pump off for a few hours.  Weight 236 pounds.  Denies any increase in swelling or SOB, chest pains, or dizziness.  Denies recent falls.  HH nures still completing dressing changes.  Reports upcoming podiatry appointment.  Missed lat appointment with PCP; awaiting transportation application from [employer].  States he had yearly eye exam, requested patient contact eye provider and request copy be sent to PCP office.  Case Manager Clinical Goal(s):  patient will demonstrate improved adherence to prescribed treatment plan for diabetes self care/management as evidenced by: at least 4 times a day monitoring and recording of CBG, adherence to ADA/ carb modified diet, adherence to prescribed medication  regimen, contacting provider for new or worsened symptoms or questions  Diabetes Interventions:  (Status: Goal on track: YES)  Long Term Goal  Collaboration with [name], MD regarding development and update of comprehensive plan of care as evidenced by provider attestation and co-signature Inter-disciplinary care team collaboration (see longitudinal plan of care) Provided education to patient about basic DM disease process Discussed medications and encouraged medication compliance Discussed plans with patient for ongoing care management follow up and provided patient with direct contact information for care management team Encouraged continued participation with CCM Pharmacist  Reviewed glucose ranges and hypoglycemic events; and discussed dangers of hypoglycemia and treatment options, need for immediate sugar replacement with juice, sugar, soda in addition to snack or candy bar or glucose tablets Discussed proper treatment of hypoglycemia Discussed healthy meal and snack options Encouraged to  continue working with [place]  Discussed and encouraged patient to be proactive with health instead of reactive after falls Fall precautions and preventions reviewed and discussed, encouraged to use walker with all ambulation,  Instructed and encouraged to wear life alert at all times Discussed logging blood sugars and writing down insulin doses taken to help provider manage medications;  Reviewed and discussed proper diabetic foot care, encouraged patient to keep check on right foot splinter wound for signs and symptoms of infection Discussed and encouraged and reinforced patient to wear bedroom/house/slides while in the home and not to walk around bare foot Encouraged to contact pcp office for completed transportation application Congratulated on current Hgb A1C Instructed to attend follow up appointment with PCP  Heart Failure Interventions:  (Status: Goal on track: NO.)  Long Term  Goal  Basic overview and discussion of pathophysiology of Heart Failure reviewed Provided education on low sodium diet Reviewed Heart Failure Action Plan in depth and provided written copy Discussed importance of daily weight and advised patient to weigh and record daily Reviewed role of diuretics in prevention of fluid overload and management of heart failure Discussed the importance of keeping all appointments with provider Provided patient with education about the role of exercise in the management of heart failure  Encouraged to weigh self daily and discussed when to weigh and when to call provider based on weight Congratulated on weighing daily recently Fall precautions and preventions  Patient Goals/Self-Care Activities Check blood sugar at least 5 times a day Check blood sugar if I feel it is too high or too low Take the blood sugar meter and log to all doctor visits  Consider drinking Boost or other supplement in the evenings if not eating a complete meal Make sure to have candy or glucose tablets on you at all times; try drinking coke along with eating snack to treat hypoglycemia Eat at least 3 meals a day with snack in between to help prevent hypoglycemia Fall precautions and preventions; Wear life alert at all times Contact Endocrinology for SUSTAINED hypoglycemic episodes Weigh self daily using walker to balance; request help when putting new batteries in scale Low salt carb modified diet Attend appointment with PCP  Follow Up Plan: The care management team will reach out to the patient again over the next 45  business days.        Plan:The care management team will reach out to the patient again over the next 60 days.  [name] RN, MSN RN Care Management Coordinator [place] [phone] [email or web address]

## 2022-04-23 DIAGNOSIS — R600 Localized edema: Secondary | ICD-10-CM | POA: Diagnosis not present

## 2022-04-23 DIAGNOSIS — Z905 Acquired absence of kidney: Secondary | ICD-10-CM | POA: Diagnosis not present

## 2022-04-23 DIAGNOSIS — E1029 Type 1 diabetes mellitus with other diabetic kidney complication: Secondary | ICD-10-CM | POA: Diagnosis not present

## 2022-04-23 DIAGNOSIS — N1831 Chronic kidney disease, stage 3a: Secondary | ICD-10-CM | POA: Diagnosis not present

## 2022-04-24 DIAGNOSIS — Z4800 Encounter for change or removal of nonsurgical wound dressing: Secondary | ICD-10-CM | POA: Diagnosis not present

## 2022-04-24 DIAGNOSIS — N1831 Chronic kidney disease, stage 3a: Secondary | ICD-10-CM | POA: Diagnosis not present

## 2022-04-24 DIAGNOSIS — I5022 Chronic systolic (congestive) heart failure: Secondary | ICD-10-CM | POA: Diagnosis not present

## 2022-04-24 DIAGNOSIS — I130 Hypertensive heart and chronic kidney disease with heart failure and stage 1 through stage 4 chronic kidney disease, or unspecified chronic kidney disease: Secondary | ICD-10-CM | POA: Diagnosis not present

## 2022-04-24 DIAGNOSIS — E1022 Type 1 diabetes mellitus with diabetic chronic kidney disease: Secondary | ICD-10-CM | POA: Diagnosis not present

## 2022-04-24 DIAGNOSIS — I890 Lymphedema, not elsewhere classified: Secondary | ICD-10-CM | POA: Diagnosis not present

## 2022-04-28 DIAGNOSIS — E1051 Type 1 diabetes mellitus with diabetic peripheral angiopathy without gangrene: Secondary | ICD-10-CM | POA: Diagnosis not present

## 2022-04-28 DIAGNOSIS — I2510 Atherosclerotic heart disease of native coronary artery without angina pectoris: Secondary | ICD-10-CM | POA: Diagnosis not present

## 2022-04-28 DIAGNOSIS — E103519 Type 1 diabetes mellitus with proliferative diabetic retinopathy with macular edema, unspecified eye: Secondary | ICD-10-CM | POA: Diagnosis not present

## 2022-04-28 DIAGNOSIS — E785 Hyperlipidemia, unspecified: Secondary | ICD-10-CM | POA: Diagnosis not present

## 2022-04-28 DIAGNOSIS — J439 Emphysema, unspecified: Secondary | ICD-10-CM | POA: Diagnosis not present

## 2022-04-28 DIAGNOSIS — H04123 Dry eye syndrome of bilateral lacrimal glands: Secondary | ICD-10-CM | POA: Diagnosis not present

## 2022-04-28 DIAGNOSIS — Z8572 Personal history of non-Hodgkin lymphomas: Secondary | ICD-10-CM | POA: Diagnosis not present

## 2022-04-28 DIAGNOSIS — Z4800 Encounter for change or removal of nonsurgical wound dressing: Secondary | ICD-10-CM | POA: Diagnosis not present

## 2022-04-28 DIAGNOSIS — Z950 Presence of cardiac pacemaker: Secondary | ICD-10-CM | POA: Diagnosis not present

## 2022-04-28 DIAGNOSIS — Z95828 Presence of other vascular implants and grafts: Secondary | ICD-10-CM | POA: Diagnosis not present

## 2022-04-28 DIAGNOSIS — F32A Depression, unspecified: Secondary | ICD-10-CM | POA: Diagnosis not present

## 2022-04-28 DIAGNOSIS — D509 Iron deficiency anemia, unspecified: Secondary | ICD-10-CM | POA: Diagnosis not present

## 2022-04-28 DIAGNOSIS — K3184 Gastroparesis: Secondary | ICD-10-CM | POA: Diagnosis not present

## 2022-04-28 DIAGNOSIS — Z9181 History of falling: Secondary | ICD-10-CM | POA: Diagnosis not present

## 2022-04-28 DIAGNOSIS — I890 Lymphedema, not elsewhere classified: Secondary | ICD-10-CM | POA: Diagnosis not present

## 2022-04-28 DIAGNOSIS — K5900 Constipation, unspecified: Secondary | ICD-10-CM | POA: Diagnosis not present

## 2022-04-28 DIAGNOSIS — E039 Hypothyroidism, unspecified: Secondary | ICD-10-CM | POA: Diagnosis not present

## 2022-04-28 DIAGNOSIS — E1022 Type 1 diabetes mellitus with diabetic chronic kidney disease: Secondary | ICD-10-CM | POA: Diagnosis not present

## 2022-04-28 DIAGNOSIS — E1043 Type 1 diabetes mellitus with diabetic autonomic (poly)neuropathy: Secondary | ICD-10-CM | POA: Diagnosis not present

## 2022-04-28 DIAGNOSIS — I872 Venous insufficiency (chronic) (peripheral): Secondary | ICD-10-CM | POA: Diagnosis not present

## 2022-04-28 DIAGNOSIS — I5022 Chronic systolic (congestive) heart failure: Secondary | ICD-10-CM | POA: Diagnosis not present

## 2022-04-28 DIAGNOSIS — I130 Hypertensive heart and chronic kidney disease with heart failure and stage 1 through stage 4 chronic kidney disease, or unspecified chronic kidney disease: Secondary | ICD-10-CM | POA: Diagnosis not present

## 2022-04-28 DIAGNOSIS — N1831 Chronic kidney disease, stage 3a: Secondary | ICD-10-CM | POA: Diagnosis not present

## 2022-04-30 DIAGNOSIS — I152 Hypertension secondary to endocrine disorders: Secondary | ICD-10-CM | POA: Diagnosis not present

## 2022-04-30 DIAGNOSIS — E1022 Type 1 diabetes mellitus with diabetic chronic kidney disease: Secondary | ICD-10-CM | POA: Diagnosis not present

## 2022-04-30 DIAGNOSIS — E10649 Type 1 diabetes mellitus with hypoglycemia without coma: Secondary | ICD-10-CM | POA: Diagnosis not present

## 2022-04-30 DIAGNOSIS — I5022 Chronic systolic (congestive) heart failure: Secondary | ICD-10-CM | POA: Diagnosis not present

## 2022-04-30 DIAGNOSIS — I890 Lymphedema, not elsewhere classified: Secondary | ICD-10-CM | POA: Diagnosis not present

## 2022-04-30 DIAGNOSIS — N1831 Chronic kidney disease, stage 3a: Secondary | ICD-10-CM | POA: Diagnosis not present

## 2022-04-30 DIAGNOSIS — I130 Hypertensive heart and chronic kidney disease with heart failure and stage 1 through stage 4 chronic kidney disease, or unspecified chronic kidney disease: Secondary | ICD-10-CM | POA: Diagnosis not present

## 2022-04-30 DIAGNOSIS — E1159 Type 2 diabetes mellitus with other circulatory complications: Secondary | ICD-10-CM | POA: Diagnosis not present

## 2022-04-30 DIAGNOSIS — Z4800 Encounter for change or removal of nonsurgical wound dressing: Secondary | ICD-10-CM | POA: Diagnosis not present

## 2022-04-30 DIAGNOSIS — E785 Hyperlipidemia, unspecified: Secondary | ICD-10-CM | POA: Diagnosis not present

## 2022-04-30 DIAGNOSIS — E1069 Type 1 diabetes mellitus with other specified complication: Secondary | ICD-10-CM | POA: Diagnosis not present

## 2022-04-30 LAB — HEMOGLOBIN A1C: Hemoglobin A1C: 7.4

## 2022-05-05 ENCOUNTER — Ambulatory Visit (INDEPENDENT_AMBULATORY_CARE_PROVIDER_SITE_OTHER): Payer: Medicare Other | Admitting: Nurse Practitioner

## 2022-05-05 ENCOUNTER — Encounter (INDEPENDENT_AMBULATORY_CARE_PROVIDER_SITE_OTHER): Payer: Self-pay | Admitting: Nurse Practitioner

## 2022-05-05 VITALS — BP 114/70 | HR 78 | Resp 17 | Ht 71.0 in | Wt 243.0 lb

## 2022-05-05 DIAGNOSIS — I10 Essential (primary) hypertension: Secondary | ICD-10-CM | POA: Diagnosis not present

## 2022-05-05 DIAGNOSIS — I890 Lymphedema, not elsewhere classified: Secondary | ICD-10-CM

## 2022-05-05 DIAGNOSIS — E1151 Type 2 diabetes mellitus with diabetic peripheral angiopathy without gangrene: Secondary | ICD-10-CM

## 2022-05-05 NOTE — Progress Notes (Signed)
Subjective:    [name], male    DOB: 09/29/1946, 76 y.o.   MRN: [id number] Chief Complaint  Patient presents with   Follow-up    [name] boot check    Gary [name] is a 76 year old male who presents today for follow-up evaluation of lower extremity edema following Unna wraps done by home health.  The patient is tolerating the wraps well.  There are some worsening swelling in areas where the wrap was removed due to being too tight but overall is doing well.    Review of Systems  Cardiovascular:  Positive for leg swelling.  Hematological:  Bruises/bleeds easily.  All other systems reviewed and are negative.      Objective:   Physical Exam Vitals reviewed.  HENT:     Head: Normocephalic.  Cardiovascular:     Rate and Rhythm: Normal rate.  Pulmonary:     Effort: Pulmonary effort is normal.  Musculoskeletal:     Right lower leg: Edema present.     Left lower leg: Edema present.  Skin:    General: Skin is warm and dry.  Neurological:     Mental Status: He is alert and oriented to person, place, and time.  Psychiatric:        Mood and Affect: Mood normal.        Behavior: Behavior normal.        Thought Content: Thought content normal.        Judgment: Judgment normal.     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    Gastroparesis    Graves disease    Hypercholesterolemia    Hypertension    Mantle cell lymphoma ([place]) 07/2014   Peripheral neuropathy    Shingles     Social History   Socioeconomic History   Marital status: Widowed    Spouse name: Not on file   Number of children: 1   Years of education: 72   Highest education level: 12th grade  Occupational History   Occupation:  Retired  Tobacco Use   Smoking status: Former   Smokeless tobacco: Current    Types: Nurse, children's Use: Never used  Substance and Sexual Activity   Alcohol use: Yes    Alcohol/week: 3.0 standard drinks of alcohol    Types: 3 Cans of beer per week    Comment: nightly   Drug use: Never   Sexual activity: Not Currently  Other Topics Concern   Not on file  Social History Narrative   Not on file   Social Determinants of Health   Financial Resource Strain: Low Risk  (03/10/2022)   Overall Financial Resource Strain (CARDIA)    Difficulty of Paying Living Expenses: Not hard at all  Food Insecurity: No Food Insecurity (03/10/2022)   Hunger Vital Sign    Worried About Running Out of Food in the Last Year: Never true    Ran Out of Food in the Last Year: Never true  Transportation Needs: Unmet Transportation Needs (04/09/2022)   PRAPARE - Transportation    Lack of Transportation (Medical): Yes    Lack of  Transportation (Non-Medical): Yes  Physical Activity: Inactive (03/10/2022)   Exercise Vital Sign    Days of Exercise per Week: 0 days    Minutes of Exercise per Session: 0 min  Stress: No Stress Concern Present (03/10/2022)   [place]    Feeling of Stress : Only a little  Social Connections: Socially Isolated (03/10/2022)   Social Connection and Isolation Panel [NHANES]    Frequency of Communication with Friends and Family: More than three times a week    Frequency of Social Gatherings with Friends and Family: Twice a week    Attends Religious Services: Never    Marine scientist or Organizations: No    Attends Archivist Meetings: Never    Marital Status: Widowed  Intimate Partner Violence: Not At Risk (03/10/2022)   Humiliation, Afraid, Rape, and Kick questionnaire    Fear of Current or Ex-Partner: No    Emotionally Abused: No    Physically Abused: No    Sexually Abused: No    Past  Surgical History:  Procedure Laterality Date   CHOLECYSTECTOMY     NEPHRECTOMY     right   PACEMAKER INSERTION N/A 12/07/2017   Procedure: INSERTION PACEMAKER DUAL CHAMBER INITIAL INSERT;  Surgeon: Isaias Cowman, MD;  Location: [facility] ORS;  Service: Cardiovascular;  Laterality: N/A;   PORTA CATH INSERTION N/A 11/03/2017   Procedure: PORTA CATH INSERTION;  Surgeon: Katha Cabal, MD;  Location: [place] [facility];  Service: Cardiovascular;  Laterality: N/A;    Family History  Problem Relation Age of Onset   Breast cancer Mother    Diabetes Father    Cancer Father        Lymphoma   Alcohol abuse Maternal Uncle    Diabetes Sister    Heart disease Other        grandfather    Allergies  Allergen Reactions   Rofecoxib Nausea Only       Latest Ref Rng & Units 10/17/2021    4:24 PM 09/13/2021    6:08 AM 09/07/2021    5:04 AM  CBC  WBC 3.8 - 10.8 Thousand/uL 5.2  2.8  2.9   Hemoglobin 13.2 - 17.1 g/dL 13.1  13.5  14.2   Hematocrit 38.5 - 50.0 % 39.5  40.8  42.9   Platelets 140 - 400 Thousand/uL 149  122  103       CMP     Component Value Date/Time   NA 139 12/17/2021 1041   NA 138 07/15/2014 0515   K 3.9 12/17/2021 1041   K 3.5 01/16/2015 1429   CL 101 12/17/2021 1041   CL 110 (H) 07/15/2014 0515   CO2 28 12/17/2021 1041   CO2 24 07/15/2014 0515   GLUCOSE 132 (H) 12/17/2021 1041   GLUCOSE 145 (H) 07/15/2014 0515   BUN 30 (H) 12/17/2021 1041   BUN 17 07/15/2014 0515   CREATININE 1.36 12/17/2021 1041   CREATININE 1.38 (H) 10/17/2021 1624   CALCIUM 8.4 12/17/2021 1041   CALCIUM 7.1 (L) 07/15/2014 0515   PROT 6.7 01/19/2022 1059   PROT 6.2 (L) 10/22/2014 1000   ALBUMIN 4.2 01/19/2022 1059   ALBUMIN 2.6 (L) 10/22/2014 1000   AST 14 01/19/2022 1059   AST 19 10/22/2014 1000   ALT 8 01/19/2022 1059   ALT 21 10/22/2014 1000   ALKPHOS 126 (H) 01/19/2022 1059   ALKPHOS 118 (H) 10/22/2014 1000   BILITOT 0.5 01/19/2022 1059  BILITOT 0.8 10/22/2014 1000    GFRNONAA 48 (L) 09/13/2021 0608   GFRNONAA >60 01/16/2015 1429   GFRAA 41 (L) 01/24/2020 0925   GFRAA >60 01/16/2015 1429     No results found.     Assessment & Plan:   1. Lymphedema The patient's lower extremity edema has been healing well.  We discussed the possibility of taking a slight holiday from the [employer].  We will not wrap the patient today but he can resume the Unna boots once he has home health visit on Friday.  We will evaluate how he does for several days without Unna boots as previously he begins to have wounds and blisters within a short period of time.  We will have her return in 4 weeks.  2. Primary hypertension Continue antihypertensive medications as already ordered, these medications have been reviewed and there are no changes at this time.   3. Type 2 diabetes mellitus with diabetic peripheral angiopathy without gangrene, without long-term current use of insulin (HCC) Continue hypoglycemic medications as already ordered, these medications have been reviewed and there are no changes at this time.  Hgb A1C to be monitored as already arranged by primary service    Current Outpatient Medications on File Prior to Visit  Medication Sig Dispense Refill   bacitracin 500 UNIT/GM ointment Apply 1 application topically daily.     Cholecalciferol (VITAMIN D3) 50 MCG (2000 UT) CAPS Take 2,000 Units by mouth daily.     feeding supplement, GLUCERNA SHAKE, (GLUCERNA SHAKE) LIQD Take 237 mLs by mouth 2 (two) times daily between meals. 237 mL 0   ferrous sulfate 325 (65 FE) MG tablet Take 325 mg by mouth daily with breakfast.     furosemide (LASIX) 40 MG tablet Take 1 tablet (40 mg total) by mouth daily. (Patient taking differently: Take 80 mg by mouth 2 (two) times daily. Patient taking 80 MG in the AM and 40 mg in the PM.) 30 tablet    glucose 4 GM chewable tablet Chew 1 tablet (4 g total) by mouth once as needed for low blood sugar. 50 tablet 0   insulin aspart (NOVOLOG) 100  UNIT/ML injection Inject 6 Units into the skin 3 (three) times daily with meals. (Patient taking differently: Inject 80-100 Units into the skin once. INSULIN PUMP :Patient counts carb intake and calculates dose, with meals night time if CBG is less than 200 patient holds insulin. Patient taking 80-100 units daily.)     levothyroxine (SYNTHROID) 25 MCG tablet Take 1 tablet (25 mcg total) by mouth daily before breakfast. 30 tablet 1   nystatin cream (MYCOSTATIN) APPLY TO AFFECTED AREA TWICE A DAY 30 g 1   potassium chloride (KLOR-CON) 10 MEQ tablet TAKE 1 TABLET BY MOUTH 2 TIMES DAILY. 60 tablet 1   pravastatin (PRAVACHOL) 40 MG tablet Take 1 tablet (40 mg total) by mouth daily. 30 tablet 3   vitamin B-12 (CYANOCOBALAMIN) 1000 MCG tablet Take 1,000 mcg by mouth daily.     FLUoxetine (PROZAC) 20 MG capsule Take 1 capsule (20 mg total) by mouth daily. (Patient not taking: Reported on 05/05/2022) 90 capsule 3   Current Facility-Administered Medications on File Prior to Visit  Medication Dose Route Frequency Provider Last Rate Last Admin   heparin lock flush 100 unit/mL  500 Units Intravenous Once Lloyd Huger, MD       sodium chloride flush (NS) 0.9 % injection 10 mL  10 mL Intravenous PRN Lloyd Huger, MD  10 mL at 02/01/18 0829   sodium chloride flush (NS) 0.9 % injection 10 mL  10 mL Intravenous PRN Lloyd Huger, MD   10 mL at 04/05/18 0800    There are no Patient Instructions on file for this visit. No follow-ups on file.   Kris Hartmann, NP

## 2022-05-07 DIAGNOSIS — E1022 Type 1 diabetes mellitus with diabetic chronic kidney disease: Secondary | ICD-10-CM | POA: Diagnosis not present

## 2022-05-07 DIAGNOSIS — N1831 Chronic kidney disease, stage 3a: Secondary | ICD-10-CM | POA: Diagnosis not present

## 2022-05-07 DIAGNOSIS — I890 Lymphedema, not elsewhere classified: Secondary | ICD-10-CM | POA: Diagnosis not present

## 2022-05-07 DIAGNOSIS — I5022 Chronic systolic (congestive) heart failure: Secondary | ICD-10-CM | POA: Diagnosis not present

## 2022-05-07 DIAGNOSIS — I130 Hypertensive heart and chronic kidney disease with heart failure and stage 1 through stage 4 chronic kidney disease, or unspecified chronic kidney disease: Secondary | ICD-10-CM | POA: Diagnosis not present

## 2022-05-07 DIAGNOSIS — Z4800 Encounter for change or removal of nonsurgical wound dressing: Secondary | ICD-10-CM | POA: Diagnosis not present

## 2022-05-08 ENCOUNTER — Telehealth: Payer: Self-pay

## 2022-05-08 ENCOUNTER — Ambulatory Visit: Payer: Medicare Other | Admitting: *Deleted

## 2022-05-08 DIAGNOSIS — E1151 Type 2 diabetes mellitus with diabetic peripheral angiopathy without gangrene: Secondary | ICD-10-CM

## 2022-05-08 DIAGNOSIS — I10 Essential (primary) hypertension: Secondary | ICD-10-CM

## 2022-05-08 DIAGNOSIS — I5022 Chronic systolic (congestive) heart failure: Secondary | ICD-10-CM

## 2022-05-08 NOTE — Patient Instructions (Signed)
CONGRATULATIONS ON COMPLETING YOUR GOALS.  IT AS BEEN A PLEASURE WORKING WITH AND TALKING TO YOU.  IF  NEEDS [facility] IN THE FUTURE PLEASE DO NOT HESITATE TO CONTACT ME  [phone]  Dolce Sylvia RN, MSN RN Care Management Coordinator [facility] [phone] [email or web address]  

## 2022-05-08 NOTE — Chronic Care Management (AMB) (Signed)
  Care Management   Follow Up Note   05/08/2022 Name: Gary [name] MRN: [id number] DOB: [birth year]   Referred by: [name], MD Reason for referral : Case Closure   Successful outreach to patient.  States she is doing well without complaints.  Discussed goals and both agree patient has met goals of the program.  Follow Up Plan: The patient has been provided with contact information for the care management team and has been advised to call with any health-related questions or concerns.  No further follow up required: as personal goals have been met.  [name] RN, MSN RN Care Management Coordinator [place] [phone] [email or web address]

## 2022-05-08 NOTE — Telephone Encounter (Signed)
Noted.  Agree with keeping appt and if any acute change or problem, will need to be evaluated.

## 2022-05-11 ENCOUNTER — Encounter: Payer: Self-pay | Admitting: Internal Medicine

## 2022-05-11 ENCOUNTER — Ambulatory Visit (INDEPENDENT_AMBULATORY_CARE_PROVIDER_SITE_OTHER): Payer: Medicare Other | Admitting: Internal Medicine

## 2022-05-11 ENCOUNTER — Other Ambulatory Visit: Payer: Self-pay

## 2022-05-11 ENCOUNTER — Encounter: Payer: Self-pay | Admitting: Oncology

## 2022-05-11 VITALS — BP 106/62 | HR 84 | Temp 98.2°F | Resp 17 | Ht 70.0 in | Wt 245.2 lb

## 2022-05-11 DIAGNOSIS — J439 Emphysema, unspecified: Secondary | ICD-10-CM

## 2022-05-11 DIAGNOSIS — I2510 Atherosclerotic heart disease of native coronary artery without angina pectoris: Secondary | ICD-10-CM

## 2022-05-11 DIAGNOSIS — D696 Thrombocytopenia, unspecified: Secondary | ICD-10-CM

## 2022-05-11 DIAGNOSIS — K3184 Gastroparesis: Secondary | ICD-10-CM | POA: Diagnosis not present

## 2022-05-11 DIAGNOSIS — I429 Cardiomyopathy, unspecified: Secondary | ICD-10-CM

## 2022-05-11 DIAGNOSIS — I5022 Chronic systolic (congestive) heart failure: Secondary | ICD-10-CM

## 2022-05-11 DIAGNOSIS — E039 Hypothyroidism, unspecified: Secondary | ICD-10-CM | POA: Diagnosis not present

## 2022-05-11 DIAGNOSIS — E7800 Pure hypercholesterolemia, unspecified: Secondary | ICD-10-CM

## 2022-05-11 DIAGNOSIS — Z125 Encounter for screening for malignant neoplasm of prostate: Secondary | ICD-10-CM | POA: Diagnosis not present

## 2022-05-11 DIAGNOSIS — R0602 Shortness of breath: Secondary | ICD-10-CM

## 2022-05-11 DIAGNOSIS — E1151 Type 2 diabetes mellitus with diabetic peripheral angiopathy without gangrene: Secondary | ICD-10-CM

## 2022-05-11 DIAGNOSIS — D509 Iron deficiency anemia, unspecified: Secondary | ICD-10-CM

## 2022-05-11 DIAGNOSIS — C8318 Mantle cell lymphoma, lymph nodes of multiple sites: Secondary | ICD-10-CM

## 2022-05-11 DIAGNOSIS — F439 Reaction to severe stress, unspecified: Secondary | ICD-10-CM

## 2022-05-11 DIAGNOSIS — D649 Anemia, unspecified: Secondary | ICD-10-CM

## 2022-05-11 DIAGNOSIS — I495 Sick sinus syndrome: Secondary | ICD-10-CM

## 2022-05-11 DIAGNOSIS — I10 Essential (primary) hypertension: Secondary | ICD-10-CM

## 2022-05-11 DIAGNOSIS — I700 Atherosclerosis of aorta: Secondary | ICD-10-CM | POA: Diagnosis not present

## 2022-05-11 DIAGNOSIS — R600 Localized edema: Secondary | ICD-10-CM

## 2022-05-11 DIAGNOSIS — N1832 Chronic kidney disease, stage 3b: Secondary | ICD-10-CM

## 2022-05-11 DIAGNOSIS — E079 Disorder of thyroid, unspecified: Secondary | ICD-10-CM

## 2022-05-11 LAB — HEPATIC FUNCTION PANEL
ALT: 7 U/L (ref 0–53)
AST: 13 U/L (ref 0–37)
Albumin: 4.2 g/dL (ref 3.5–5.2)
Alkaline Phosphatase: 84 U/L (ref 39–117)
Bilirubin, Direct: 0.1 mg/dL (ref 0.0–0.3)
Total Bilirubin: 0.5 mg/dL (ref 0.2–1.2)
Total Protein: 6.9 g/dL (ref 6.0–8.3)

## 2022-05-11 LAB — CBC WITH DIFFERENTIAL/PLATELET
Basophils Absolute: 0.1 10*3/uL (ref 0.0–0.1)
Basophils Relative: 1.1 % (ref 0.0–3.0)
Eosinophils Absolute: 0.1 10*3/uL (ref 0.0–0.7)
Eosinophils Relative: 2.1 % (ref 0.0–5.0)
HCT: 43.7 % (ref 39.0–52.0)
Hemoglobin: 14.8 g/dL (ref 13.0–17.0)
Lymphocytes Relative: 14.4 % (ref 12.0–46.0)
Lymphs Abs: 0.9 10*3/uL (ref 0.7–4.0)
MCHC: 33.9 g/dL (ref 30.0–36.0)
MCV: 91.9 fl (ref 78.0–100.0)
Monocytes Absolute: 0.5 10*3/uL (ref 0.1–1.0)
Monocytes Relative: 7.7 % (ref 3.0–12.0)
Neutro Abs: 4.4 10*3/uL (ref 1.4–7.7)
Neutrophils Relative %: 74.7 % (ref 43.0–77.0)
Platelets: 154 10*3/uL (ref 150.0–400.0)
RBC: 4.76 Mil/uL (ref 4.22–5.81)
RDW: 14 % (ref 11.5–15.5)
WBC: 6 10*3/uL (ref 4.0–10.5)

## 2022-05-11 LAB — BASIC METABOLIC PANEL
BUN: 26 mg/dL — ABNORMAL HIGH (ref 6–23)
CO2: 22 mEq/L (ref 19–32)
Calcium: 8.1 mg/dL — ABNORMAL LOW (ref 8.4–10.5)
Chloride: 104 mEq/L (ref 96–112)
Creatinine, Ser: 1.65 mg/dL — ABNORMAL HIGH (ref 0.40–1.50)
GFR: 40.84 mL/min — ABNORMAL LOW (ref >60.00)
Glucose, Bld: 89 mg/dL (ref 70–99)
Potassium: 3.7 mEq/L (ref 3.5–5.1)
Sodium: 140 mEq/L (ref 135–145)

## 2022-05-11 LAB — LIPID PANEL
Cholesterol: 123 mg/dL (ref 0–200)
HDL: 52.9 mg/dL (ref >39.00)
LDL Cholesterol: 60 mg/dL (ref 0–99)
NonHDL: 70.48
Total CHOL/HDL Ratio: 2
Triglycerides: 52 mg/dL (ref 0.0–149.0)
VLDL: 10.4 mg/dL (ref 0.0–40.0)

## 2022-05-11 LAB — PSA, MEDICARE: PSA: 1.98 ng/ml (ref 0.10–4.00)

## 2022-05-11 LAB — TSH: TSH: 2.85 u[IU]/mL (ref 0.35–5.50)

## 2022-05-11 NOTE — Progress Notes (Signed)
[name], male   DOB: 12/01/1945, 76 y.o.   MRN: [id number]   Subjective:    [name], male    DOB: Nov 21, 1945, 76 y.o.   MRN: [id number]   Patient here for a scheduled follow up.   Chief Complaint  Patient presents with   Hypertension   Diabetes   .   HPI Received notification recently from home assessment - weight had increased.  Asked to come in for evaluation.  States he is eating better.  Getting meals on wheels. Breathing is overall stable.  Discussed walking to confirm no oxygen saturation drop with ambulation.  No chest pain.  No increased cough or congestion.  No abdominal pain.  Bowels moving.  Sugars stable.  Reports no significant problem with low sugars now.  Sees endocrinology.  Weight this am 236.  States when he weighs himself, weight has been 233-236.  Sees vascular for his lower extremity swelling.  They have been wrapping his legs.  Left off last week.  Noticed increased swelling.  Home health wrapped.     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    Gastroparesis    Graves disease    Hypercholesterolemia    Hypertension    Mantle cell lymphoma ([place]) 07/2014   Peripheral neuropathy    Shingles    Past Surgical History:  Procedure Laterality Date   CHOLECYSTECTOMY     NEPHRECTOMY     right   PACEMAKER INSERTION N/A 12/07/2017   Procedure: INSERTION PACEMAKER DUAL CHAMBER INITIAL INSERT;  Surgeon: Isaias Cowman, MD;  Location: [facility];  Service: Cardiovascular;  Laterality: N/A;   PORTA CATH INSERTION N/A 11/03/2017   Procedure: PORTA CATH INSERTION;  Surgeon: Katha Cabal, MD;  Location: [facility];  Service: Cardiovascular;  Laterality: N/A;   Family History  Problem Relation Age of Onset   Breast cancer Mother    Diabetes Father    Cancer Father         Lymphoma   Alcohol abuse Maternal Uncle    Diabetes Sister    Heart disease Other        grandfather   Social History   Socioeconomic History   Marital status: Widowed    Spouse name: Not on file   Number of children: 1   Years of education: 83   Highest education level: 12th grade  Occupational History   Occupation: Retired  Tobacco Use   Smoking status: Former   Smokeless tobacco: Current    Types: Nurse, children's Use: Never used  Substance and Sexual Activity   Alcohol use: Yes    Alcohol/week: 3.0 standard drinks of alcohol    Types: 3 Cans of beer per week    Comment: nightly   Drug use: Never   Sexual activity: Not Currently  Other Topics Concern   Not on file  Social History Narrative   Not on file   Social Determinants of Health   Financial Resource Strain: Low Risk  (03/10/2022)   Overall Financial Resource Strain (CARDIA)    Difficulty of Paying Living Expenses: Not hard at all  Food Insecurity: No Food Insecurity (03/10/2022)   Hunger Vital Sign    Worried About Running Out of Food in the Last Year: Never true    Ran  Out of Food in the Last Year: Never true  Transportation Needs: Unmet Transportation Needs (04/09/2022)   PRAPARE - Transportation    Lack of Transportation (Medical): Yes    Lack of Transportation (Non-Medical): Yes  Physical Activity: Inactive (03/10/2022)   Exercise Vital Sign    Days of Exercise per Week: 0 days    Minutes of Exercise per Session: 0 min  Stress: No Stress Concern Present (03/10/2022)   [place]    Feeling of Stress : Only a little  Social Connections: Socially Isolated (03/10/2022)   Social Connection and Isolation Panel [NHANES]    Frequency of Communication with Friends and Family: More than three times a week    Frequency of Social Gatherings with Friends and Family: Twice a week    Attends Religious Services: Never    Corporate treasurer or Organizations: No    Attends Archivist Meetings: Never    Marital Status: Widowed     Review of Systems  Constitutional:  Negative for appetite change and unexpected weight change.  HENT:  Negative for congestion and sinus pressure.   Respiratory:  Negative for cough and chest tightness.        Breathing stable.   Cardiovascular:  Positive for leg swelling. Negative for chest pain and palpitations.  Gastrointestinal:  Negative for abdominal pain, diarrhea, nausea and vomiting.  Genitourinary:  Negative for difficulty urinating and dysuria.  Musculoskeletal:  Negative for joint swelling and myalgias.  Skin:  Negative for color change and rash.  Neurological:  Negative for dizziness and headaches.  Psychiatric/Behavioral:  Negative for agitation and dysphoric mood.        Objective:     BP 106/62 (BP Location: Left Arm, Patient Position: Sitting, Cuff Size: Large)   Pulse 84   Temp 98.2 F (36.8 C) (Temporal)   Resp 17   Ht $R'5\' 10"'yC$  (1.778 m)   Wt 245 lb 3.2 oz (111.2 kg)   SpO2 98%   BMI 35.18 kg/m  Wt Readings from Last 3 Encounters:  05/11/22 245 lb 3.2 oz (111.2 kg)  05/05/22 243 lb (110.2 kg)  04/07/22 244 lb (110.7 kg)    Physical Exam Constitutional:      General: He is not in acute distress.    Appearance: Normal appearance. He is well-developed.  HENT:     Head: Normocephalic and atraumatic.     Right Ear: External ear normal.     Left Ear: External ear normal.  Eyes:     General: No scleral icterus.       Right eye: No discharge.        Left eye: No discharge.  Cardiovascular:     Rate and Rhythm: Normal rate and regular rhythm.  Pulmonary:     Effort: Pulmonary effort is normal. No respiratory distress.     Breath sounds: Normal breath sounds.  Abdominal:     General: Bowel sounds are normal.     Palpations: Abdomen is soft.     Tenderness: There is no abdominal tenderness.  Musculoskeletal:        General: No swelling or  tenderness.     Cervical back: Neck supple. No tenderness.  Lymphadenopathy:     Cervical: No cervical adenopathy.  Skin:    Findings: No erythema or rash.  Neurological:     Mental Status: He is alert.  Psychiatric:        Mood and Affect: Mood  normal.        Behavior: Behavior normal.      Outpatient Encounter Medications as of 05/11/2022  Medication Sig   bacitracin 500 UNIT/GM ointment Apply 1 application topically daily.   Cholecalciferol (VITAMIN D3) 50 MCG (2000 UT) CAPS Take 2,000 Units by mouth daily.   feeding supplement, GLUCERNA SHAKE, (GLUCERNA SHAKE) LIQD Take 237 mLs by mouth 2 (two) times daily between meals.   ferrous sulfate 325 (65 FE) MG tablet Take 325 mg by mouth daily with breakfast.   furosemide (LASIX) 40 MG tablet Take 1 tablet (40 mg total) by mouth daily. (Patient taking differently: Take 80 mg by mouth 2 (two) times daily. Patient taking 80 MG in the AM and 40 mg in the PM.)   glucose 4 GM chewable tablet Chew 1 tablet (4 g total) by mouth once as needed for low blood sugar.   insulin aspart (NOVOLOG) 100 UNIT/ML injection Inject 6 Units into the skin 3 (three) times daily with meals. (Patient taking differently: Inject 80-100 Units into the skin once. INSULIN PUMP :Patient counts carb intake and calculates dose, with meals night time if CBG is less than 200 patient holds insulin. Patient taking 80-100 units daily.)   levothyroxine (SYNTHROID) 25 MCG tablet Take 1 tablet (25 mcg total) by mouth daily before breakfast.   nystatin cream (MYCOSTATIN) APPLY TO AFFECTED AREA TWICE A DAY   potassium chloride (KLOR-CON) 10 MEQ tablet TAKE 1 TABLET BY MOUTH 2 TIMES DAILY.   pravastatin (PRAVACHOL) 40 MG tablet Take 1 tablet (40 mg total) by mouth daily.   vitamin B-12 (CYANOCOBALAMIN) 1000 MCG tablet Take 1,000 mcg by mouth daily.   [DISCONTINUED] FLUoxetine (PROZAC) 20 MG capsule Take 1 capsule (20 mg total) by mouth daily. (Patient not taking: Reported on 05/11/2022)    Facility-Administered Encounter Medications as of 05/11/2022  Medication   heparin lock flush 100 unit/mL   sodium chloride flush (NS) 0.9 % injection 10 mL   sodium chloride flush (NS) 0.9 % injection 10 mL     Lab Results  Component Value Date   WBC 6.0 05/11/2022   HGB 14.8 05/11/2022   HCT 43.7 05/11/2022   PLT 154.0 05/11/2022   GLUCOSE 89 05/11/2022   CHOL 123 05/11/2022   TRIG 52.0 05/11/2022   HDL 52.90 05/11/2022   LDLCALC 60 05/11/2022   ALT 7 05/11/2022   AST 13 05/11/2022   NA 140 05/11/2022   K 3.7 05/11/2022   CL 104 05/11/2022   CREATININE 1.65 (H) 05/11/2022   BUN 26 (H) 05/11/2022   CO2 22 05/11/2022   TSH 2.85 05/11/2022   PSA 1.98 05/11/2022   INR 1.2 08/29/2021   HGBA1C 7.4 04/30/2022   MICROALBUR <0.7 05/11/2022    CT HEAD WO CONTRAST (5MM)  Result Date: 08/29/2021 CLINICAL DATA:  Altered mental status, Unwitnessed fall, left rib pain EXAM: CT HEAD WITHOUT CONTRAST CT MAXILLOFACIAL WITHOUT CONTRAST CT CERVICAL SPINE WITHOUT CONTRAST CT CHEST, ABDOMEN AND PELVIS WITH CONTRAST TECHNIQUE: Contiguous axial images were obtained from the base of the skull through the vertex without intravenous contrast. Multidetector CT imaging of the maxillofacial structures was performed. Multiplanar CT image reconstructions were also generated. A small metallic BB was placed on the right temple in order to reliably differentiate right from left. Multidetector CT imaging of the cervical spine was performed without intravenous contrast. Multiplanar CT image reconstructions were also generated. Multidetector CT imaging of the chest, abdomen and pelvis was performed following the standard protocol  during bolus administration of intravenous contrast. CONTRAST:  182mL OMNIPAQUE IOHEXOL 300 MG/ML  SOLN COMPARISON:  08/25/2021, 07/29/2020 FINDINGS: CT HEAD FINDINGS Brain: Normal anatomic configuration. Parenchymal volume loss is commensurate with the patient's age. Mild  periventricular white matter changes are present likely reflecting the sequela of small vessel ischemia. No abnormal intra or extra-axial mass lesion or fluid collection. No abnormal mass effect or midline shift. No evidence of acute intracranial hemorrhage or infarct. Ventricular size is normal. Cerebellum unremarkable. Vascular: No asymmetric hyperdense vasculature at the skull base. Skull: Intact Other: Mastoid air cells and middle ear cavities are clear. Small left frontal scalp hematoma noted. CT MAXILLOFACIAL FINDINGS Osseous: No fracture or mandibular dislocation. No destructive process. Orbits: Ocular lenses have been removed. The orbits are otherwise unremarkable. Sinuses: The paranasal sinuses are clear. Soft tissues: Mild left preseptal and infraorbital soft tissue swelling. CT CERVICAL SPINE FINDINGS Alignment: 2 mm anterolisthesis of C3 upon C4 is unchanged. Skull base and vertebrae: Craniocervical alignment is normal. Atlantodental interval is not widened. No acute fracture the cervical spine. Vertebral body height has been preserved. Soft tissues and spinal canal: No prevertebral fluid or swelling. No visible canal hematoma. Disc levels: Intervertebral disc space narrowing and endplate remodeling at I6-9 and C6-7 is in keeping with changes of moderate degenerative disc disease. Minimal degenerative changes are seen throughout the remainder of the cervical spine. The prevertebral soft tissues are not thickened on sagittal reformats. Mild central canal stenosis at C3-4 secondary to anterolisthesis and associated posterior disc osteophyte complex. Review of the axial images demonstrates multilevel uncovertebral and facet arthrosis resulting in moderate left and mild right neuroforaminal narrowing at C3-4 and C4-5. Other: None CT CHEST FINDINGS Cardiovascular: Moderate coronary artery calcification. Global cardiac size within normal limits. No pericardial effusion. Central pulmonary arteries are of normal  caliber. The thoracic aorta is unremarkable. Left subclavian dual lead pacemaker is in place with leads within the right atrium and right ventricle. Left internal jugular chest port tip is seen within the superior vena cava. Mediastinum/Nodes: No enlarged mediastinal, hilar, or axillary lymph nodes. Thyroid gland, trachea, and esophagus demonstrate no significant findings. Lungs/Pleura: Mild basilar predominant pulmonary fibrotic change. No focal pulmonary infiltrate. No pneumothorax or pleural effusion. No central obstructing mass. Musculoskeletal: An acute minimally displaced fracture of the left seventh rib is seen laterally. Multiple healed bilateral rib fractures are noted. CT ABDOMEN AND PELVIS FINDINGS Hepatobiliary: No focal liver abnormality is seen. Status post cholecystectomy. No biliary dilatation. Pancreas: Unremarkable Spleen: Unremarkable Adrenals/Urinary Tract: Adrenal glands are unremarkable. Status post right nephrectomy. Left kidney is normal in size and position. Stable probable hyperdense renal cyst arising from the lower pole of the left kidney. Left kidney is otherwise unremarkable. Bladder unremarkable. Stomach/Bowel: The stomach, small bowel, and large bowel are unremarkable. No free intraperitoneal gas or fluid. Vascular/Lymphatic: Aortic atherosclerosis. No enlarged abdominal or pelvic lymph nodes. Reproductive: Mild prostatic enlargement. Other: Tiny left fat containing inguinal hernia Musculoskeletal: No acute bone abnormality within the abdomen and pelvis. Osseous structures are age-appropriate. IMPRESSION: No acute intracranial injury. No calvarial fracture. Small left frontal scalp hematoma. No acute facial fracture. Mild left preseptal and infraorbital soft tissue swelling. No acute fracture or listhesis of the cervical spine. Acute left seventh rib fracture.  No pneumothorax. No acute intra-abdominal pathology. Additional incidental findings as noted above. Aortic Atherosclerosis  (ICD10-I70.0).  Fall Electronically Signed   By: Fidela Salisbury M.D.   On: 08/29/2021 02:56   CT Cervical Spine Wo Contrast  Result  Date: 08/29/2021 CLINICAL DATA:  Altered mental status, Unwitnessed fall, left rib pain EXAM: CT HEAD WITHOUT CONTRAST CT MAXILLOFACIAL WITHOUT CONTRAST CT CERVICAL SPINE WITHOUT CONTRAST CT CHEST, ABDOMEN AND PELVIS WITH CONTRAST TECHNIQUE: Contiguous axial images were obtained from the base of the skull through the vertex without intravenous contrast. Multidetector CT imaging of the maxillofacial structures was performed. Multiplanar CT image reconstructions were also generated. A small metallic BB was placed on the right temple in order to reliably differentiate right from left. Multidetector CT imaging of the cervical spine was performed without intravenous contrast. Multiplanar CT image reconstructions were also generated. Multidetector CT imaging of the chest, abdomen and pelvis was performed following the standard protocol during bolus administration of intravenous contrast. CONTRAST:  188m OMNIPAQUE IOHEXOL 300 MG/ML  SOLN COMPARISON:  08/25/2021, 07/29/2020 FINDINGS: CT HEAD FINDINGS Brain: Normal anatomic configuration. Parenchymal volume loss is commensurate with the patient's age. Mild periventricular white matter changes are present likely reflecting the sequela of small vessel ischemia. No abnormal intra or extra-axial mass lesion or fluid collection. No abnormal mass effect or midline shift. No evidence of acute intracranial hemorrhage or infarct. Ventricular size is normal. Cerebellum unremarkable. Vascular: No asymmetric hyperdense vasculature at the skull base. Skull: Intact Other: Mastoid air cells and middle ear cavities are clear. Small left frontal scalp hematoma noted. CT MAXILLOFACIAL FINDINGS Osseous: No fracture or mandibular dislocation. No destructive process. Orbits: Ocular lenses have been removed. The orbits are otherwise unremarkable. Sinuses: The  paranasal sinuses are clear. Soft tissues: Mild left preseptal and infraorbital soft tissue swelling. CT CERVICAL SPINE FINDINGS Alignment: 2 mm anterolisthesis of C3 upon C4 is unchanged. Skull base and vertebrae: Craniocervical alignment is normal. Atlantodental interval is not widened. No acute fracture the cervical spine. Vertebral body height has been preserved. Soft tissues and spinal canal: No prevertebral fluid or swelling. No visible canal hematoma. Disc levels: Intervertebral disc space narrowing and endplate remodeling at CG2-6and C6-7 is in keeping with changes of moderate degenerative disc disease. Minimal degenerative changes are seen throughout the remainder of the cervical spine. The prevertebral soft tissues are not thickened on sagittal reformats. Mild central canal stenosis at C3-4 secondary to anterolisthesis and associated posterior disc osteophyte complex. Review of the axial images demonstrates multilevel uncovertebral and facet arthrosis resulting in moderate left and mild right neuroforaminal narrowing at C3-4 and C4-5. Other: None CT CHEST FINDINGS Cardiovascular: Moderate coronary artery calcification. Global cardiac size within normal limits. No pericardial effusion. Central pulmonary arteries are of normal caliber. The thoracic aorta is unremarkable. Left subclavian dual lead pacemaker is in place with leads within the right atrium and right ventricle. Left internal jugular chest port tip is seen within the superior vena cava. Mediastinum/Nodes: No enlarged mediastinal, hilar, or axillary lymph nodes. Thyroid gland, trachea, and esophagus demonstrate no significant findings. Lungs/Pleura: Mild basilar predominant pulmonary fibrotic change. No focal pulmonary infiltrate. No pneumothorax or pleural effusion. No central obstructing mass. Musculoskeletal: An acute minimally displaced fracture of the left seventh rib is seen laterally. Multiple healed bilateral rib fractures are noted. CT  ABDOMEN AND PELVIS FINDINGS Hepatobiliary: No focal liver abnormality is seen. Status post cholecystectomy. No biliary dilatation. Pancreas: Unremarkable Spleen: Unremarkable Adrenals/Urinary Tract: Adrenal glands are unremarkable. Status post right nephrectomy. Left kidney is normal in size and position. Stable probable hyperdense renal cyst arising from the lower pole of the left kidney. Left kidney is otherwise unremarkable. Bladder unremarkable. Stomach/Bowel: The stomach, small bowel, and large bowel are unremarkable. No free  intraperitoneal gas or fluid. Vascular/Lymphatic: Aortic atherosclerosis. No enlarged abdominal or pelvic lymph nodes. Reproductive: Mild prostatic enlargement. Other: Tiny left fat containing inguinal hernia Musculoskeletal: No acute bone abnormality within the abdomen and pelvis. Osseous structures are age-appropriate. IMPRESSION: No acute intracranial injury. No calvarial fracture. Small left frontal scalp hematoma. No acute facial fracture. Mild left preseptal and infraorbital soft tissue swelling. No acute fracture or listhesis of the cervical spine. Acute left seventh rib fracture.  No pneumothorax. No acute intra-abdominal pathology. Additional incidental findings as noted above. Aortic Atherosclerosis (ICD10-I70.0).  Fall Electronically Signed   By: Fidela Salisbury M.D.   On: 08/29/2021 02:56   CT Maxillofacial Wo Contrast  Result Date: 08/29/2021 CLINICAL DATA:  Altered mental status, Unwitnessed fall, left rib pain EXAM: CT HEAD WITHOUT CONTRAST CT MAXILLOFACIAL WITHOUT CONTRAST CT CERVICAL SPINE WITHOUT CONTRAST CT CHEST, ABDOMEN AND PELVIS WITH CONTRAST TECHNIQUE: Contiguous axial images were obtained from the base of the skull through the vertex without intravenous contrast. Multidetector CT imaging of the maxillofacial structures was performed. Multiplanar CT image reconstructions were also generated. A small metallic BB was placed on the right temple in order to reliably  differentiate right from left. Multidetector CT imaging of the cervical spine was performed without intravenous contrast. Multiplanar CT image reconstructions were also generated. Multidetector CT imaging of the chest, abdomen and pelvis was performed following the standard protocol during bolus administration of intravenous contrast. CONTRAST:  16m OMNIPAQUE IOHEXOL 300 MG/ML  SOLN COMPARISON:  08/25/2021, 07/29/2020 FINDINGS: CT HEAD FINDINGS Brain: Normal anatomic configuration. Parenchymal volume loss is commensurate with the patient's age. Mild periventricular white matter changes are present likely reflecting the sequela of small vessel ischemia. No abnormal intra or extra-axial mass lesion or fluid collection. No abnormal mass effect or midline shift. No evidence of acute intracranial hemorrhage or infarct. Ventricular size is normal. Cerebellum unremarkable. Vascular: No asymmetric hyperdense vasculature at the skull base. Skull: Intact Other: Mastoid air cells and middle ear cavities are clear. Small left frontal scalp hematoma noted. CT MAXILLOFACIAL FINDINGS Osseous: No fracture or mandibular dislocation. No destructive process. Orbits: Ocular lenses have been removed. The orbits are otherwise unremarkable. Sinuses: The paranasal sinuses are clear. Soft tissues: Mild left preseptal and infraorbital soft tissue swelling. CT CERVICAL SPINE FINDINGS Alignment: 2 mm anterolisthesis of C3 upon C4 is unchanged. Skull base and vertebrae: Craniocervical alignment is normal. Atlantodental interval is not widened. No acute fracture the cervical spine. Vertebral body height has been preserved. Soft tissues and spinal canal: No prevertebral fluid or swelling. No visible canal hematoma. Disc levels: Intervertebral disc space narrowing and endplate remodeling at CQ9-1and C6-7 is in keeping with changes of moderate degenerative disc disease. Minimal degenerative changes are seen throughout the remainder of the  cervical spine. The prevertebral soft tissues are not thickened on sagittal reformats. Mild central canal stenosis at C3-4 secondary to anterolisthesis and associated posterior disc osteophyte complex. Review of the axial images demonstrates multilevel uncovertebral and facet arthrosis resulting in moderate left and mild right neuroforaminal narrowing at C3-4 and C4-5. Other: None CT CHEST FINDINGS Cardiovascular: Moderate coronary artery calcification. Global cardiac size within normal limits. No pericardial effusion. Central pulmonary arteries are of normal caliber. The thoracic aorta is unremarkable. Left subclavian dual lead pacemaker is in place with leads within the right atrium and right ventricle. Left internal jugular chest port tip is seen within the superior vena cava. Mediastinum/Nodes: No enlarged mediastinal, hilar, or axillary lymph nodes. Thyroid gland, trachea, and esophagus  demonstrate no significant findings. Lungs/Pleura: Mild basilar predominant pulmonary fibrotic change. No focal pulmonary infiltrate. No pneumothorax or pleural effusion. No central obstructing mass. Musculoskeletal: An acute minimally displaced fracture of the left seventh rib is seen laterally. Multiple healed bilateral rib fractures are noted. CT ABDOMEN AND PELVIS FINDINGS Hepatobiliary: No focal liver abnormality is seen. Status post cholecystectomy. No biliary dilatation. Pancreas: Unremarkable Spleen: Unremarkable Adrenals/Urinary Tract: Adrenal glands are unremarkable. Status post right nephrectomy. Left kidney is normal in size and position. Stable probable hyperdense renal cyst arising from the lower pole of the left kidney. Left kidney is otherwise unremarkable. Bladder unremarkable. Stomach/Bowel: The stomach, small bowel, and large bowel are unremarkable. No free intraperitoneal gas or fluid. Vascular/Lymphatic: Aortic atherosclerosis. No enlarged abdominal or pelvic lymph nodes. Reproductive: Mild prostatic  enlargement. Other: Tiny left fat containing inguinal hernia Musculoskeletal: No acute bone abnormality within the abdomen and pelvis. Osseous structures are age-appropriate. IMPRESSION: No acute intracranial injury. No calvarial fracture. Small left frontal scalp hematoma. No acute facial fracture. Mild left preseptal and infraorbital soft tissue swelling. No acute fracture or listhesis of the cervical spine. Acute left seventh rib fracture.  No pneumothorax. No acute intra-abdominal pathology. Additional incidental findings as noted above. Aortic Atherosclerosis (ICD10-I70.0).  Fall Electronically Signed   By: Fidela Salisbury M.D.   On: 08/29/2021 02:56   CT CHEST ABDOMEN PELVIS W CONTRAST  Result Date: 08/29/2021 CLINICAL DATA:  Altered mental status, Unwitnessed fall, left rib pain EXAM: CT HEAD WITHOUT CONTRAST CT MAXILLOFACIAL WITHOUT CONTRAST CT CERVICAL SPINE WITHOUT CONTRAST CT CHEST, ABDOMEN AND PELVIS WITH CONTRAST TECHNIQUE: Contiguous axial images were obtained from the base of the skull through the vertex without intravenous contrast. Multidetector CT imaging of the maxillofacial structures was performed. Multiplanar CT image reconstructions were also generated. A small metallic BB was placed on the right temple in order to reliably differentiate right from left. Multidetector CT imaging of the cervical spine was performed without intravenous contrast. Multiplanar CT image reconstructions were also generated. Multidetector CT imaging of the chest, abdomen and pelvis was performed following the standard protocol during bolus administration of intravenous contrast. CONTRAST:  136mL OMNIPAQUE IOHEXOL 300 MG/ML  SOLN COMPARISON:  08/25/2021, 07/29/2020 FINDINGS: CT HEAD FINDINGS Brain: Normal anatomic configuration. Parenchymal volume loss is commensurate with the patient's age. Mild periventricular white matter changes are present likely reflecting the sequela of small vessel ischemia. No abnormal  intra or extra-axial mass lesion or fluid collection. No abnormal mass effect or midline shift. No evidence of acute intracranial hemorrhage or infarct. Ventricular size is normal. Cerebellum unremarkable. Vascular: No asymmetric hyperdense vasculature at the skull base. Skull: Intact Other: Mastoid air cells and middle ear cavities are clear. Small left frontal scalp hematoma noted. CT MAXILLOFACIAL FINDINGS Osseous: No fracture or mandibular dislocation. No destructive process. Orbits: Ocular lenses have been removed. The orbits are otherwise unremarkable. Sinuses: The paranasal sinuses are clear. Soft tissues: Mild left preseptal and infraorbital soft tissue swelling. CT CERVICAL SPINE FINDINGS Alignment: 2 mm anterolisthesis of C3 upon C4 is unchanged. Skull base and vertebrae: Craniocervical alignment is normal. Atlantodental interval is not widened. No acute fracture the cervical spine. Vertebral body height has been preserved. Soft tissues and spinal canal: No prevertebral fluid or swelling. No visible canal hematoma. Disc levels: Intervertebral disc space narrowing and endplate remodeling at M1-9 and C6-7 is in keeping with changes of moderate degenerative disc disease. Minimal degenerative changes are seen throughout the remainder of the cervical spine. The prevertebral soft tissues  are not thickened on sagittal reformats. Mild central canal stenosis at C3-4 secondary to anterolisthesis and associated posterior disc osteophyte complex. Review of the axial images demonstrates multilevel uncovertebral and facet arthrosis resulting in moderate left and mild right neuroforaminal narrowing at C3-4 and C4-5. Other: None CT CHEST FINDINGS Cardiovascular: Moderate coronary artery calcification. Global cardiac size within normal limits. No pericardial effusion. Central pulmonary arteries are of normal caliber. The thoracic aorta is unremarkable. Left subclavian dual lead pacemaker is in place with leads within the  right atrium and right ventricle. Left internal jugular chest port tip is seen within the superior vena cava. Mediastinum/Nodes: No enlarged mediastinal, hilar, or axillary lymph nodes. Thyroid gland, trachea, and esophagus demonstrate no significant findings. Lungs/Pleura: Mild basilar predominant pulmonary fibrotic change. No focal pulmonary infiltrate. No pneumothorax or pleural effusion. No central obstructing mass. Musculoskeletal: An acute minimally displaced fracture of the left seventh rib is seen laterally. Multiple healed bilateral rib fractures are noted. CT ABDOMEN AND PELVIS FINDINGS Hepatobiliary: No focal liver abnormality is seen. Status post cholecystectomy. No biliary dilatation. Pancreas: Unremarkable Spleen: Unremarkable Adrenals/Urinary Tract: Adrenal glands are unremarkable. Status post right nephrectomy. Left kidney is normal in size and position. Stable probable hyperdense renal cyst arising from the lower pole of the left kidney. Left kidney is otherwise unremarkable. Bladder unremarkable. Stomach/Bowel: The stomach, small bowel, and large bowel are unremarkable. No free intraperitoneal gas or fluid. Vascular/Lymphatic: Aortic atherosclerosis. No enlarged abdominal or pelvic lymph nodes. Reproductive: Mild prostatic enlargement. Other: Tiny left fat containing inguinal hernia Musculoskeletal: No acute bone abnormality within the abdomen and pelvis. Osseous structures are age-appropriate. IMPRESSION: No acute intracranial injury. No calvarial fracture. Small left frontal scalp hematoma. No acute facial fracture. Mild left preseptal and infraorbital soft tissue swelling. No acute fracture or listhesis of the cervical spine. Acute left seventh rib fracture.  No pneumothorax. No acute intra-abdominal pathology. Additional incidental findings as noted above. Aortic Atherosclerosis (ICD10-I70.0).  Fall Electronically Signed   By: Fidela Salisbury M.D.   On: 08/29/2021 02:56       Assessment &  Plan:   Problem List Items Addressed This Visit     Anemia    Follow cbc.       Aortic atherosclerosis (HCC)    Continue pravastatin.       CAD in native artery    Continue risk factor modification.  Followed by cardiology.       Cardiomyopathy, idiopathic ([place])    On lasix daily.  Breathing stable.  Weight as outlined.  Discussed weighing daily and parameters given.  Low sodium diet.  Follow.       Chronic systolic CHF (congestive heart failure) (HCC)    On lasix.  Discussed the need to follow weight.   Breathing stable.       CKD (chronic kidney disease) stage 3, GFR 30-59 ml/min (HCC)    Has been evaluated by Dr Candiss Norse.  Avoid antiinflammatories.  Follow metabolic panel.       Diabetes [facility])    Being followed by endocrinology.  Last a1c 7.4.  No significant problems with low sugars now.  Low carb diet.  Follow met b and a1c.       Relevant Orders   Urine Microalbumin w/creat. ratio (Completed)   Edema of lower extremity    Being followed by [facility] as outlined.  Legs wrapped.       Emphysema lung (HCC)    Breathing stable. No desaturation with 5 minute walk.  Follow.  Essential hypertension, benign    On coreg and lasix.  Blood pressure as outlined.  Follow pressures.  Follow metabolic panel.       Gastroparesis    Swallowing ok on no medication.  Follow.       HTN (hypertension) - Primary   Relevant Orders   Basic Metabolic Panel (BMET) (Completed)   Hypercholesterolemia    Continue pravastatin.  Low cholesterol diet and exercise.  Follow lipid panel and liver function tests.       Relevant Orders   Lipid Profile (Completed)   Hepatic function panel (Completed)   CBC with Differential/Platelet (Completed)   Hypothyroid   Relevant Orders   TSH (Completed)   Iron deficiency anemia    Follow cbc and iron studies.        Mantle cell lymphoma ([place])    Has been followed by oncology.       Sick sinus syndrome [facility])    S/p pacemaker placement.   Followed by cardiology.       SOB (shortness of breath)    Breathing stable.  On lasix.  No desaturation with 5 minute walk.  Follow.       Stress    Overall appears to be doing better.  Follow.       Thrombocytopenia ([place])    Follow cbc.       Thyroid disease    Followed by endocrinology.       Other Visit Diagnoses     Prostate cancer screening       Relevant Orders   PSA, Medicare (Completed)        [name], MD

## 2022-05-12 ENCOUNTER — Inpatient Hospital Stay: Payer: Medicare Other | Attending: Oncology

## 2022-05-12 ENCOUNTER — Inpatient Hospital Stay: Payer: Medicare Other

## 2022-05-12 DIAGNOSIS — C8310 Mantle cell lymphoma, unspecified site: Secondary | ICD-10-CM | POA: Insufficient documentation

## 2022-05-12 DIAGNOSIS — Z452 Encounter for adjustment and management of vascular access device: Secondary | ICD-10-CM | POA: Diagnosis present

## 2022-05-12 DIAGNOSIS — Z95828 Presence of other vascular implants and grafts: Secondary | ICD-10-CM

## 2022-05-12 LAB — MICROALBUMIN / CREATININE URINE RATIO
Creatinine,U: 27.7 mg/dL
Microalb Creat Ratio: 2.5 mg/g (ref 0.0–30.0)
Microalb, Ur: <0.7 mg/dL (ref 0.0–1.9)

## 2022-05-12 MED ORDER — HEPARIN SOD (PORK) LOCK FLUSH 100 UNIT/ML IV SOLN
500.0000 [IU] | Freq: Once | INTRAVENOUS | Status: AC
  Administered 2022-05-12: 500 [IU] via INTRAVENOUS
  Filled 2022-05-12: qty 5

## 2022-05-12 MED ORDER — SODIUM CHLORIDE 0.9% FLUSH
10.0000 mL | Freq: Once | INTRAVENOUS | Status: AC
  Administered 2022-05-12: 10 mL via INTRAVENOUS
  Filled 2022-05-12: qty 10

## 2022-05-14 DIAGNOSIS — I130 Hypertensive heart and chronic kidney disease with heart failure and stage 1 through stage 4 chronic kidney disease, or unspecified chronic kidney disease: Secondary | ICD-10-CM | POA: Diagnosis not present

## 2022-05-14 DIAGNOSIS — E1022 Type 1 diabetes mellitus with diabetic chronic kidney disease: Secondary | ICD-10-CM | POA: Diagnosis not present

## 2022-05-14 DIAGNOSIS — I890 Lymphedema, not elsewhere classified: Secondary | ICD-10-CM | POA: Diagnosis not present

## 2022-05-14 DIAGNOSIS — Z4800 Encounter for change or removal of nonsurgical wound dressing: Secondary | ICD-10-CM | POA: Diagnosis not present

## 2022-05-14 DIAGNOSIS — I5022 Chronic systolic (congestive) heart failure: Secondary | ICD-10-CM | POA: Diagnosis not present

## 2022-05-14 DIAGNOSIS — N1831 Chronic kidney disease, stage 3a: Secondary | ICD-10-CM | POA: Diagnosis not present

## 2022-05-15 ENCOUNTER — Other Ambulatory Visit: Payer: Self-pay

## 2022-05-15 DIAGNOSIS — N289 Disorder of kidney and ureter, unspecified: Secondary | ICD-10-CM

## 2022-05-17 ENCOUNTER — Encounter: Payer: Self-pay | Admitting: Internal Medicine

## 2022-05-17 NOTE — Assessment & Plan Note (Signed)
Has been evaluated by Dr Singh.  Avoid antiinflammatories.  Follow metabolic panel.  

## 2022-05-17 NOTE — Assessment & Plan Note (Signed)
Follow [facility].  

## 2022-05-17 NOTE — Assessment & Plan Note (Signed)
Being followed by endocrinology.  Last a1c 7.4.  No significant problems with low sugars now.  Low carb diet.  Follow met b and a1c.

## 2022-05-17 NOTE — Assessment & Plan Note (Signed)
Follow cbc and iron studies.  

## 2022-05-17 NOTE — Assessment & Plan Note (Signed)
Overall appears to be doing better.  Follow.  

## 2022-05-17 NOTE — Assessment & Plan Note (Signed)
Continue risk factor modification.  Followed by cardiology.  

## 2022-05-17 NOTE — Assessment & Plan Note (Signed)
Swallowing ok on no medication.  Follow.

## 2022-05-17 NOTE — Assessment & Plan Note (Signed)
Continue pravastatin.  Low cholesterol diet and exercise.  Follow lipid panel and liver function tests.   

## 2022-05-17 NOTE — Assessment & Plan Note (Signed)
Breathing stable.  On lasix.  No desaturation with 5 minute walk.  Follow.

## 2022-05-17 NOTE — Assessment & Plan Note (Signed)
Being followed by [facility] as outlined.  Legs wrapped.

## 2022-05-17 NOTE — Assessment & Plan Note (Signed)
On lasix daily.  Breathing stable.  Weight as outlined.  Discussed weighing daily and parameters given.  Low sodium diet.  Follow.

## 2022-05-17 NOTE — Assessment & Plan Note (Signed)
S/p pacemaker placement.  Followed by cardiology.  

## 2022-05-17 NOTE — Assessment & Plan Note (Signed)
On coreg and lasix.  Blood pressure as outlined.  Follow pressures.  Follow metabolic panel.  

## 2022-05-17 NOTE — Assessment & Plan Note (Signed)
Has been followed by oncology.  

## 2022-05-17 NOTE — Assessment & Plan Note (Signed)
On lasix.  Discussed the need to follow weight.   Breathing stable.

## 2022-05-17 NOTE — Assessment & Plan Note (Signed)
Followed by endocrinology 

## 2022-05-17 NOTE — Assessment & Plan Note (Signed)
Continue pravastatin 

## 2022-05-17 NOTE — Assessment & Plan Note (Signed)
Breathing stable. No desaturation with 5 minute walk.  Follow.

## 2022-05-22 DIAGNOSIS — I130 Hypertensive heart and chronic kidney disease with heart failure and stage 1 through stage 4 chronic kidney disease, or unspecified chronic kidney disease: Secondary | ICD-10-CM | POA: Diagnosis not present

## 2022-05-22 DIAGNOSIS — N1831 Chronic kidney disease, stage 3a: Secondary | ICD-10-CM | POA: Diagnosis not present

## 2022-05-22 DIAGNOSIS — I890 Lymphedema, not elsewhere classified: Secondary | ICD-10-CM | POA: Diagnosis not present

## 2022-05-22 DIAGNOSIS — I5022 Chronic systolic (congestive) heart failure: Secondary | ICD-10-CM | POA: Diagnosis not present

## 2022-05-22 DIAGNOSIS — E1022 Type 1 diabetes mellitus with diabetic chronic kidney disease: Secondary | ICD-10-CM | POA: Diagnosis not present

## 2022-05-22 DIAGNOSIS — Z4800 Encounter for change or removal of nonsurgical wound dressing: Secondary | ICD-10-CM | POA: Diagnosis not present

## 2022-05-23 ENCOUNTER — Other Ambulatory Visit: Payer: Self-pay | Admitting: Internal Medicine

## 2022-05-26 DIAGNOSIS — N1831 Chronic kidney disease, stage 3a: Secondary | ICD-10-CM | POA: Diagnosis not present

## 2022-05-26 DIAGNOSIS — Z4800 Encounter for change or removal of nonsurgical wound dressing: Secondary | ICD-10-CM | POA: Diagnosis not present

## 2022-05-26 DIAGNOSIS — I5022 Chronic systolic (congestive) heart failure: Secondary | ICD-10-CM | POA: Diagnosis not present

## 2022-05-26 DIAGNOSIS — E1022 Type 1 diabetes mellitus with diabetic chronic kidney disease: Secondary | ICD-10-CM | POA: Diagnosis not present

## 2022-05-26 DIAGNOSIS — I890 Lymphedema, not elsewhere classified: Secondary | ICD-10-CM | POA: Diagnosis not present

## 2022-05-26 DIAGNOSIS — I130 Hypertensive heart and chronic kidney disease with heart failure and stage 1 through stage 4 chronic kidney disease, or unspecified chronic kidney disease: Secondary | ICD-10-CM | POA: Diagnosis not present

## 2022-05-27 ENCOUNTER — Telehealth: Payer: Self-pay | Admitting: Internal Medicine

## 2022-05-27 NOTE — Telephone Encounter (Signed)
Collie Siad , home health nurse, [phone]. She would like to know how much insulin is going through patients pump.

## 2022-05-28 ENCOUNTER — Other Ambulatory Visit (INDEPENDENT_AMBULATORY_CARE_PROVIDER_SITE_OTHER): Payer: Medicare Other

## 2022-05-28 DIAGNOSIS — D509 Iron deficiency anemia, unspecified: Secondary | ICD-10-CM | POA: Diagnosis not present

## 2022-05-28 DIAGNOSIS — Z794 Long term (current) use of insulin: Secondary | ICD-10-CM | POA: Diagnosis not present

## 2022-05-28 DIAGNOSIS — I2510 Atherosclerotic heart disease of native coronary artery without angina pectoris: Secondary | ICD-10-CM | POA: Diagnosis not present

## 2022-05-28 DIAGNOSIS — H04123 Dry eye syndrome of bilateral lacrimal glands: Secondary | ICD-10-CM | POA: Diagnosis not present

## 2022-05-28 DIAGNOSIS — N289 Disorder of kidney and ureter, unspecified: Secondary | ICD-10-CM

## 2022-05-28 DIAGNOSIS — I5022 Chronic systolic (congestive) heart failure: Secondary | ICD-10-CM | POA: Diagnosis not present

## 2022-05-28 DIAGNOSIS — K3184 Gastroparesis: Secondary | ICD-10-CM | POA: Diagnosis not present

## 2022-05-28 DIAGNOSIS — K5900 Constipation, unspecified: Secondary | ICD-10-CM | POA: Diagnosis not present

## 2022-05-28 DIAGNOSIS — E103519 Type 1 diabetes mellitus with proliferative diabetic retinopathy with macular edema, unspecified eye: Secondary | ICD-10-CM | POA: Diagnosis not present

## 2022-05-28 DIAGNOSIS — I130 Hypertensive heart and chronic kidney disease with heart failure and stage 1 through stage 4 chronic kidney disease, or unspecified chronic kidney disease: Secondary | ICD-10-CM | POA: Diagnosis not present

## 2022-05-28 DIAGNOSIS — I890 Lymphedema, not elsewhere classified: Secondary | ICD-10-CM | POA: Diagnosis not present

## 2022-05-28 DIAGNOSIS — E1043 Type 1 diabetes mellitus with diabetic autonomic (poly)neuropathy: Secondary | ICD-10-CM | POA: Diagnosis not present

## 2022-05-28 DIAGNOSIS — Z9641 Presence of insulin pump (external) (internal): Secondary | ICD-10-CM | POA: Diagnosis not present

## 2022-05-28 DIAGNOSIS — E039 Hypothyroidism, unspecified: Secondary | ICD-10-CM | POA: Diagnosis not present

## 2022-05-28 DIAGNOSIS — E1022 Type 1 diabetes mellitus with diabetic chronic kidney disease: Secondary | ICD-10-CM | POA: Diagnosis not present

## 2022-05-28 DIAGNOSIS — N1831 Chronic kidney disease, stage 3a: Secondary | ICD-10-CM | POA: Diagnosis not present

## 2022-05-28 DIAGNOSIS — E10622 Type 1 diabetes mellitus with other skin ulcer: Secondary | ICD-10-CM | POA: Diagnosis not present

## 2022-05-28 DIAGNOSIS — J439 Emphysema, unspecified: Secondary | ICD-10-CM | POA: Diagnosis not present

## 2022-05-28 DIAGNOSIS — E1051 Type 1 diabetes mellitus with diabetic peripheral angiopathy without gangrene: Secondary | ICD-10-CM | POA: Diagnosis not present

## 2022-05-28 DIAGNOSIS — I872 Venous insufficiency (chronic) (peripheral): Secondary | ICD-10-CM | POA: Diagnosis not present

## 2022-05-28 DIAGNOSIS — L89892 Pressure ulcer of other site, stage 2: Secondary | ICD-10-CM | POA: Diagnosis not present

## 2022-05-28 DIAGNOSIS — Z4800 Encounter for change or removal of nonsurgical wound dressing: Secondary | ICD-10-CM | POA: Diagnosis not present

## 2022-05-28 DIAGNOSIS — E785 Hyperlipidemia, unspecified: Secondary | ICD-10-CM | POA: Diagnosis not present

## 2022-05-28 DIAGNOSIS — F32A Depression, unspecified: Secondary | ICD-10-CM | POA: Diagnosis not present

## 2022-05-28 DIAGNOSIS — Z95828 Presence of other vascular implants and grafts: Secondary | ICD-10-CM | POA: Diagnosis not present

## 2022-05-28 DIAGNOSIS — Z950 Presence of cardiac pacemaker: Secondary | ICD-10-CM | POA: Diagnosis not present

## 2022-05-28 LAB — BASIC METABOLIC PANEL
BUN: 31 mg/dL — ABNORMAL HIGH (ref 6–23)
CO2: 28 mEq/L (ref 19–32)
Calcium: 7.9 mg/dL — ABNORMAL LOW (ref 8.4–10.5)
Chloride: 99 mEq/L (ref 96–112)
Creatinine, Ser: 1.58 mg/dL — ABNORMAL HIGH (ref 0.40–1.50)
GFR: 43.01 mL/min — ABNORMAL LOW (ref >60.00)
Glucose, Bld: 162 mg/dL — ABNORMAL HIGH (ref 70–99)
Potassium: 3.6 mEq/L (ref 3.5–5.1)
Sodium: 135 mEq/L (ref 135–145)

## 2022-05-29 ENCOUNTER — Other Ambulatory Visit: Payer: Self-pay | Admitting: Internal Medicine

## 2022-05-29 ENCOUNTER — Telehealth: Payer: Self-pay

## 2022-05-29 NOTE — Telephone Encounter (Signed)
Spoke to Patient about lab results and Dr. Bary Leriche recommendations of being referred to nephrology but he is already seeing Dr. Candiss Norse and the last appointment was 04/23/22. Patient asked me to let you know that he is already a Patient with Dr. Candiss Norse and see if there is anything else he needs to do?

## 2022-05-29 NOTE — Progress Notes (Signed)
Spoke to Patient about lab results and Dr. Bary Leriche recommendations of being referred to nephrology but he is already seeing Dr. Candiss Norse and the last appointment was 04/23/22. Patient asked me to let you know that he is already a Patient with Dr. Candiss Norse and see if there is anything else he needs to do?

## 2022-05-30 ENCOUNTER — Telehealth: Payer: Self-pay | Admitting: Internal Medicine

## 2022-05-30 NOTE — Telephone Encounter (Signed)
Please fax labs to Dr Keturah Barre office and call and notify them - labs being faxed. Just want to confirm if earlier f/u appt warranted.    Gary [name]   [place], [place] [address]   [phone] (Work)   [phone] (Fax)

## 2022-05-30 NOTE — Telephone Encounter (Signed)
-----   Message from Suzanna Obey, [place] sent at 05/29/2022 11:17 AM EDT ----- Regarding: Nephrology Patient stated he is already a Patient with Dr. Candiss Norse. His last office visit with Dr. Candiss Norse was 04/23/22. Does he need to do anything else?

## 2022-06-01 NOTE — Telephone Encounter (Signed)
I have faxed labs with message to Dr. Keturah Barre office.

## 2022-06-02 ENCOUNTER — Ambulatory Visit (INDEPENDENT_AMBULATORY_CARE_PROVIDER_SITE_OTHER): Payer: Medicare Other | Admitting: Nurse Practitioner

## 2022-06-02 ENCOUNTER — Encounter (INDEPENDENT_AMBULATORY_CARE_PROVIDER_SITE_OTHER): Payer: Self-pay | Admitting: Nurse Practitioner

## 2022-06-02 ENCOUNTER — Telehealth: Payer: Self-pay | Admitting: Internal Medicine

## 2022-06-02 VITALS — BP 132/74 | HR 85 | Resp 16 | Wt 250.0 lb

## 2022-06-02 DIAGNOSIS — I890 Lymphedema, not elsewhere classified: Secondary | ICD-10-CM

## 2022-06-02 DIAGNOSIS — I10 Essential (primary) hypertension: Secondary | ICD-10-CM

## 2022-06-02 NOTE — Telephone Encounter (Signed)
Gary [name] from [facility] home [facility] need visit note stating pt has stage 2 on his left foot Fax [phone]

## 2022-06-03 NOTE — Telephone Encounter (Signed)
faxed

## 2022-06-04 DIAGNOSIS — L89892 Pressure ulcer of other site, stage 2: Secondary | ICD-10-CM | POA: Diagnosis not present

## 2022-06-04 DIAGNOSIS — Z4800 Encounter for change or removal of nonsurgical wound dressing: Secondary | ICD-10-CM | POA: Diagnosis not present

## 2022-06-04 DIAGNOSIS — E10622 Type 1 diabetes mellitus with other skin ulcer: Secondary | ICD-10-CM | POA: Diagnosis not present

## 2022-06-04 DIAGNOSIS — I890 Lymphedema, not elsewhere classified: Secondary | ICD-10-CM | POA: Diagnosis not present

## 2022-06-04 DIAGNOSIS — I5022 Chronic systolic (congestive) heart failure: Secondary | ICD-10-CM | POA: Diagnosis not present

## 2022-06-04 DIAGNOSIS — I130 Hypertensive heart and chronic kidney disease with heart failure and stage 1 through stage 4 chronic kidney disease, or unspecified chronic kidney disease: Secondary | ICD-10-CM | POA: Diagnosis not present

## 2022-06-08 ENCOUNTER — Ambulatory Visit (INDEPENDENT_AMBULATORY_CARE_PROVIDER_SITE_OTHER): Payer: Medicare Other | Admitting: Podiatry

## 2022-06-08 ENCOUNTER — Encounter: Payer: Self-pay | Admitting: Podiatry

## 2022-06-08 DIAGNOSIS — B351 Tinea unguium: Secondary | ICD-10-CM | POA: Diagnosis not present

## 2022-06-08 DIAGNOSIS — M79675 Pain in left toe(s): Secondary | ICD-10-CM | POA: Diagnosis not present

## 2022-06-08 DIAGNOSIS — M79674 Pain in right toe(s): Secondary | ICD-10-CM

## 2022-06-08 DIAGNOSIS — E1151 Type 2 diabetes mellitus with diabetic peripheral angiopathy without gangrene: Secondary | ICD-10-CM | POA: Diagnosis not present

## 2022-06-08 DIAGNOSIS — N179 Acute kidney failure, unspecified: Secondary | ICD-10-CM

## 2022-06-08 NOTE — Progress Notes (Signed)
This patient returns to my office for at risk foot care.  This patient requires this care by a professional since this patient will be at risk due to having chronic kidney disease and diabetes.  This patient is unable to cut nails himself since the patient cannot reach his nails.These nails are painful walking and wearing shoes. This patient is wearing unna boots on both legs/feet. This patient presents for at risk foot care today.  General Appearance  Alert, conversant and in no acute stress.  Vascular  Deferred due to unna boots.  Neurologic  Deferred due to unna boots.  Nails Thick disfigured discolored nails with subungual debris  from hallux to fifth toes bilaterally. No evidence of bacterial infection or drainage bilaterally.   Orthopedic  No limitations of motion  feet .  No crepitus or effusions noted.  No bony pathology or digital deformities noted.  Skin  normotropic skin with no porokeratosis noted bilaterally.  No signs of infections or ulcers noted.     Onychomycosis  Pain in right toes  Pain in left toes  Consent was obtained for treatment procedures.   Mechanical debridement of nails 1-5  bilaterally performed with a nail nipper.  Filed with dremel without incident. Cauterized digits left foot.   Return office visit    3 months                 Told patient to return for periodic foot care and evaluation due to potential at risk complications.   Lyndle Pang DPM  

## 2022-06-09 ENCOUNTER — Encounter (INDEPENDENT_AMBULATORY_CARE_PROVIDER_SITE_OTHER): Payer: Medicare Other

## 2022-06-09 DIAGNOSIS — E10622 Type 1 diabetes mellitus with other skin ulcer: Secondary | ICD-10-CM | POA: Diagnosis not present

## 2022-06-09 DIAGNOSIS — L89892 Pressure ulcer of other site, stage 2: Secondary | ICD-10-CM | POA: Diagnosis not present

## 2022-06-09 DIAGNOSIS — I130 Hypertensive heart and chronic kidney disease with heart failure and stage 1 through stage 4 chronic kidney disease, or unspecified chronic kidney disease: Secondary | ICD-10-CM | POA: Diagnosis not present

## 2022-06-09 DIAGNOSIS — Z4800 Encounter for change or removal of nonsurgical wound dressing: Secondary | ICD-10-CM | POA: Diagnosis not present

## 2022-06-09 DIAGNOSIS — I890 Lymphedema, not elsewhere classified: Secondary | ICD-10-CM | POA: Diagnosis not present

## 2022-06-09 DIAGNOSIS — I5022 Chronic systolic (congestive) heart failure: Secondary | ICD-10-CM | POA: Diagnosis not present

## 2022-06-10 DIAGNOSIS — K5900 Constipation, unspecified: Secondary | ICD-10-CM

## 2022-06-10 DIAGNOSIS — E785 Hyperlipidemia, unspecified: Secondary | ICD-10-CM

## 2022-06-10 DIAGNOSIS — E1051 Type 1 diabetes mellitus with diabetic peripheral angiopathy without gangrene: Secondary | ICD-10-CM

## 2022-06-10 DIAGNOSIS — E1022 Type 1 diabetes mellitus with diabetic chronic kidney disease: Secondary | ICD-10-CM

## 2022-06-10 DIAGNOSIS — E103519 Type 1 diabetes mellitus with proliferative diabetic retinopathy with macular edema, unspecified eye: Secondary | ICD-10-CM

## 2022-06-10 DIAGNOSIS — D509 Iron deficiency anemia, unspecified: Secondary | ICD-10-CM

## 2022-06-10 DIAGNOSIS — Z794 Long term (current) use of insulin: Secondary | ICD-10-CM

## 2022-06-10 DIAGNOSIS — Z95828 Presence of other vascular implants and grafts: Secondary | ICD-10-CM

## 2022-06-10 DIAGNOSIS — E039 Hypothyroidism, unspecified: Secondary | ICD-10-CM

## 2022-06-10 DIAGNOSIS — I5022 Chronic systolic (congestive) heart failure: Secondary | ICD-10-CM

## 2022-06-10 DIAGNOSIS — I130 Hypertensive heart and chronic kidney disease with heart failure and stage 1 through stage 4 chronic kidney disease, or unspecified chronic kidney disease: Secondary | ICD-10-CM | POA: Diagnosis not present

## 2022-06-10 DIAGNOSIS — Z950 Presence of cardiac pacemaker: Secondary | ICD-10-CM

## 2022-06-10 DIAGNOSIS — K3184 Gastroparesis: Secondary | ICD-10-CM | POA: Diagnosis not present

## 2022-06-10 DIAGNOSIS — Z4800 Encounter for change or removal of nonsurgical wound dressing: Secondary | ICD-10-CM | POA: Diagnosis not present

## 2022-06-10 DIAGNOSIS — I2510 Atherosclerotic heart disease of native coronary artery without angina pectoris: Secondary | ICD-10-CM

## 2022-06-10 DIAGNOSIS — F32A Depression, unspecified: Secondary | ICD-10-CM

## 2022-06-10 DIAGNOSIS — H04123 Dry eye syndrome of bilateral lacrimal glands: Secondary | ICD-10-CM

## 2022-06-10 DIAGNOSIS — I890 Lymphedema, not elsewhere classified: Secondary | ICD-10-CM | POA: Diagnosis not present

## 2022-06-10 DIAGNOSIS — J439 Emphysema, unspecified: Secondary | ICD-10-CM

## 2022-06-10 DIAGNOSIS — N1831 Chronic kidney disease, stage 3a: Secondary | ICD-10-CM | POA: Diagnosis not present

## 2022-06-10 DIAGNOSIS — Z9181 History of falling: Secondary | ICD-10-CM

## 2022-06-10 DIAGNOSIS — E10622 Type 1 diabetes mellitus with other skin ulcer: Secondary | ICD-10-CM | POA: Diagnosis not present

## 2022-06-10 DIAGNOSIS — Z9641 Presence of insulin pump (external) (internal): Secondary | ICD-10-CM

## 2022-06-10 DIAGNOSIS — E1043 Type 1 diabetes mellitus with diabetic autonomic (poly)neuropathy: Secondary | ICD-10-CM | POA: Diagnosis not present

## 2022-06-10 DIAGNOSIS — Z8572 Personal history of non-Hodgkin lymphomas: Secondary | ICD-10-CM

## 2022-06-10 DIAGNOSIS — L89892 Pressure ulcer of other site, stage 2: Secondary | ICD-10-CM | POA: Diagnosis not present

## 2022-06-10 DIAGNOSIS — I872 Venous insufficiency (chronic) (peripheral): Secondary | ICD-10-CM | POA: Diagnosis not present

## 2022-06-16 ENCOUNTER — Encounter (INDEPENDENT_AMBULATORY_CARE_PROVIDER_SITE_OTHER): Payer: Medicare Other

## 2022-06-16 DIAGNOSIS — E10622 Type 1 diabetes mellitus with other skin ulcer: Secondary | ICD-10-CM | POA: Diagnosis not present

## 2022-06-16 DIAGNOSIS — I130 Hypertensive heart and chronic kidney disease with heart failure and stage 1 through stage 4 chronic kidney disease, or unspecified chronic kidney disease: Secondary | ICD-10-CM | POA: Diagnosis not present

## 2022-06-16 DIAGNOSIS — I890 Lymphedema, not elsewhere classified: Secondary | ICD-10-CM | POA: Diagnosis not present

## 2022-06-16 DIAGNOSIS — Z4800 Encounter for change or removal of nonsurgical wound dressing: Secondary | ICD-10-CM | POA: Diagnosis not present

## 2022-06-16 DIAGNOSIS — I5022 Chronic systolic (congestive) heart failure: Secondary | ICD-10-CM | POA: Diagnosis not present

## 2022-06-16 DIAGNOSIS — L89892 Pressure ulcer of other site, stage 2: Secondary | ICD-10-CM | POA: Diagnosis not present

## 2022-06-21 ENCOUNTER — Encounter (INDEPENDENT_AMBULATORY_CARE_PROVIDER_SITE_OTHER): Payer: Self-pay | Admitting: Nurse Practitioner

## 2022-06-21 NOTE — Progress Notes (Signed)
Subjective:    [name], male    DOB: Feb 15, 1946, 76 y.o.   MRN: [id number] Chief Complaint  Patient presents with   Follow-up    4 week follow up    Gary [name] is a 76 year old male who presents today for follow-up evaluation of lower extremity edema following Unna wraps done by home health.  The patient is tolerating the wraps well.  Previously we will have the patient have a slight holiday.  Dermatology the patient had worsening edema and developed some small ulcer.  Areas.  These are largely healed today however.  Overall he is currently doing well.    Review of Systems  Cardiovascular:  Positive for leg swelling.  All other systems reviewed and are negative.      Objective:   Physical Exam Vitals reviewed.  HENT:     Head: Normocephalic.  Cardiovascular:     Rate and Rhythm: Normal rate.  Pulmonary:     Effort: Pulmonary effort is normal.  Skin:    General: Skin is warm and dry.  Neurological:     Mental Status: He is alert and oriented to person, place, and time.  Psychiatric:        Mood and Affect: Mood normal.        Behavior: Behavior normal.        Thought Content: Thought content normal.        Judgment: Judgment normal.     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    Gastroparesis    Graves disease    Hypercholesterolemia    Hypertension    Mantle cell lymphoma ([place]) 07/2014   Peripheral neuropathy    Shingles     Social History   Socioeconomic History   Marital status: Widowed    Spouse name: Not on file   Number of children: 1   Years of education: 36   Highest education level: 12th grade  Occupational History   Occupation: Retired  Tobacco Use   Smoking status: Former    Smokeless tobacco: Current    Types: Nurse, children's Use: Never used  Substance and Sexual Activity   Alcohol use: Yes    Alcohol/week: 3.0 standard drinks of alcohol    Types: 3 Cans of beer per week    Comment: nightly   Drug use: Never   Sexual activity: Not Currently  Other Topics Concern   Not on file  Social History Narrative   Not on file   Social Determinants of Health   Financial Resource Strain: Low Risk  (03/10/2022)   Overall Financial Resource Strain (CARDIA)    Difficulty of Paying Living Expenses: Not hard at all  Food Insecurity: No Food Insecurity (03/10/2022)   Hunger Vital Sign    Worried About Running Out of Food in the Last Year: Never true    Ran Out of Food in the Last Year: Never true  Transportation Needs: Unmet Transportation Needs (04/09/2022)   PRAPARE - Transportation    Lack of Transportation (Medical): Yes    Lack of Transportation (Non-Medical): Yes  Physical Activity: Inactive (03/10/2022)   Exercise Vital Sign  Days of Exercise per Week: 0 days    Minutes of Exercise per Session: 0 min  Stress: No Stress Concern Present (03/10/2022)   [place]    Feeling of Stress : Only a little  Social Connections: Socially Isolated (03/10/2022)   Social Connection and Isolation Panel [NHANES]    Frequency of Communication with Friends and Family: More than three times a week    Frequency of Social Gatherings with Friends and Family: Twice a week    Attends Religious Services: Never    Marine scientist or Organizations: No    Attends Archivist Meetings: Never    Marital Status: Widowed  Intimate Partner Violence: Not At Risk (03/10/2022)   Humiliation, Afraid, Rape, and Kick questionnaire    Fear of Current or Ex-Partner: No    Emotionally Abused: No    Physically Abused: No    Sexually Abused: No    Past Surgical History:  Procedure Laterality Date    CHOLECYSTECTOMY     NEPHRECTOMY     right   PACEMAKER INSERTION N/A 12/07/2017   Procedure: INSERTION PACEMAKER DUAL CHAMBER INITIAL INSERT;  Surgeon: Isaias Cowman, MD;  Location: [facility];  Service: Cardiovascular;  Laterality: N/A;   PORTA CATH INSERTION N/A 11/03/2017   Procedure: PORTA CATH INSERTION;  Surgeon: Katha Cabal, MD;  Location: [place] [facility];  Service: Cardiovascular;  Laterality: N/A;    Family History  Problem Relation Age of Onset   Breast cancer Mother    Diabetes Father    Cancer Father        Lymphoma   Alcohol abuse Maternal Uncle    Diabetes Sister    Heart disease Other        grandfather    Allergies  Allergen Reactions   Rofecoxib Nausea Only       Latest Ref Rng & Units 05/11/2022   10:15 AM 10/17/2021    4:24 PM 09/13/2021    6:08 AM  CBC  WBC 4.0 - 10.5 K/uL 6.0  5.2  2.8   Hemoglobin 13.0 - 17.0 g/dL 14.8  13.1  13.5   Hematocrit 39.0 - 52.0 % 43.7  39.5  40.8   Platelets 150.0 - 400.0 K/uL 154.0  149  122       CMP     Component Value Date/Time   NA 135 05/28/2022 1002   NA 138 07/15/2014 0515   K 3.6 05/28/2022 1002   K 3.5 01/16/2015 1429   CL 99 05/28/2022 1002   CL 110 (H) 07/15/2014 0515   CO2 28 05/28/2022 1002   CO2 24 07/15/2014 0515   GLUCOSE 162 (H) 05/28/2022 1002   GLUCOSE 145 (H) 07/15/2014 0515   BUN 31 (H) 05/28/2022 1002   BUN 17 07/15/2014 0515   CREATININE 1.58 (H) 05/28/2022 1002   CREATININE 1.38 (H) 10/17/2021 1624   CALCIUM 7.9 (L) 05/28/2022 1002   CALCIUM 7.1 (L) 07/15/2014 0515   PROT 6.9 05/11/2022 1015   PROT 6.2 (L) 10/22/2014 1000   ALBUMIN 4.2 05/11/2022 1015   ALBUMIN 2.6 (L) 10/22/2014 1000   AST 13 05/11/2022 1015   AST 19 10/22/2014 1000   ALT 7 05/11/2022 1015   ALT 21 10/22/2014 1000   ALKPHOS 84 05/11/2022 1015   ALKPHOS 118 (H) 10/22/2014 1000   BILITOT 0.5 05/11/2022 1015   BILITOT 0.8 10/22/2014 1000   GFRNONAA 48 (L) 09/13/2021 2536   GFRNONAA >  60 01/16/2015  1429   GFRAA 41 (L) 01/24/2020 0925   GFRAA >60 01/16/2015 1429     No results found.     Assessment & Plan:   1. Lymphedema Patient tolerating the wraps well.  The patient will return in 4 weeks for reevaluation.  2. Primary hypertension Continue antihypertensive medications as already ordered, these medications have been reviewed and there are no changes at this time.    Current Outpatient Medications on File Prior to Visit  Medication Sig Dispense Refill   bacitracin 500 UNIT/GM ointment Apply 1 application topically daily.     Cholecalciferol (VITAMIN D3) 50 MCG (2000 UT) CAPS Take 2,000 Units by mouth daily.     feeding supplement, GLUCERNA SHAKE, (GLUCERNA SHAKE) LIQD Take 237 mLs by mouth 2 (two) times daily between meals. 237 mL 0   ferrous sulfate 325 (65 FE) MG tablet Take 325 mg by mouth daily with breakfast.     furosemide (LASIX) 40 MG tablet Take 1 tablet (40 mg total) by mouth daily. (Patient taking differently: Take 80 mg by mouth 2 (two) times daily. Patient taking 80 MG in the AM and 40 mg in the PM.) 30 tablet    glucose 4 GM chewable tablet Chew 1 tablet (4 g total) by mouth once as needed for low blood sugar. 50 tablet 0   insulin aspart (NOVOLOG) 100 UNIT/ML injection Inject 6 Units into the skin 3 (three) times daily with meals. (Patient taking differently: Inject 80-100 Units into the skin once. INSULIN PUMP :Patient counts carb intake and calculates dose, with meals night time if CBG is less than 200 patient holds insulin. Patient taking 80-100 units daily.)     levothyroxine (SYNTHROID) 25 MCG tablet Take 1 tablet (25 mcg total) by mouth daily before breakfast. 30 tablet 1   nystatin cream (MYCOSTATIN) APPLY TO AFFECTED AREA TWICE A DAY 30 g 1   potassium chloride (KLOR-CON) 10 MEQ tablet TAKE 1 TABLET BY MOUTH TWICE A DAY 60 tablet 1   pravastatin (PRAVACHOL) 40 MG tablet Take 1 tablet (40 mg total) by mouth daily. 30 tablet 3   vitamin B-12 (CYANOCOBALAMIN)  1000 MCG tablet Take 1,000 mcg by mouth daily.     Current Facility-Administered Medications on File Prior to Visit  Medication Dose Route Frequency Provider Last Rate Last Admin   heparin lock flush 100 unit/mL  500 Units Intravenous Once Lloyd Huger, MD       sodium chloride flush (NS) 0.9 % injection 10 mL  10 mL Intravenous PRN Lloyd Huger, MD   10 mL at 02/01/18 0829   sodium chloride flush (NS) 0.9 % injection 10 mL  10 mL Intravenous PRN Lloyd Huger, MD   10 mL at 04/05/18 0800    There are no Patient Instructions on file for this visit. No follow-ups on file.   Kris Hartmann, NP

## 2022-06-23 ENCOUNTER — Encounter (INDEPENDENT_AMBULATORY_CARE_PROVIDER_SITE_OTHER): Payer: Medicare Other

## 2022-06-23 DIAGNOSIS — I130 Hypertensive heart and chronic kidney disease with heart failure and stage 1 through stage 4 chronic kidney disease, or unspecified chronic kidney disease: Secondary | ICD-10-CM | POA: Diagnosis not present

## 2022-06-23 DIAGNOSIS — E10622 Type 1 diabetes mellitus with other skin ulcer: Secondary | ICD-10-CM | POA: Diagnosis not present

## 2022-06-23 DIAGNOSIS — Z4800 Encounter for change or removal of nonsurgical wound dressing: Secondary | ICD-10-CM | POA: Diagnosis not present

## 2022-06-23 DIAGNOSIS — L89892 Pressure ulcer of other site, stage 2: Secondary | ICD-10-CM | POA: Diagnosis not present

## 2022-06-23 DIAGNOSIS — I5022 Chronic systolic (congestive) heart failure: Secondary | ICD-10-CM | POA: Diagnosis not present

## 2022-06-23 DIAGNOSIS — I890 Lymphedema, not elsewhere classified: Secondary | ICD-10-CM | POA: Diagnosis not present

## 2022-06-27 DIAGNOSIS — E1043 Type 1 diabetes mellitus with diabetic autonomic (poly)neuropathy: Secondary | ICD-10-CM | POA: Diagnosis not present

## 2022-06-27 DIAGNOSIS — E1022 Type 1 diabetes mellitus with diabetic chronic kidney disease: Secondary | ICD-10-CM | POA: Diagnosis not present

## 2022-06-27 DIAGNOSIS — Z9641 Presence of insulin pump (external) (internal): Secondary | ICD-10-CM | POA: Diagnosis not present

## 2022-06-27 DIAGNOSIS — H04123 Dry eye syndrome of bilateral lacrimal glands: Secondary | ICD-10-CM | POA: Diagnosis not present

## 2022-06-27 DIAGNOSIS — J439 Emphysema, unspecified: Secondary | ICD-10-CM | POA: Diagnosis not present

## 2022-06-27 DIAGNOSIS — K5900 Constipation, unspecified: Secondary | ICD-10-CM | POA: Diagnosis not present

## 2022-06-27 DIAGNOSIS — I5022 Chronic systolic (congestive) heart failure: Secondary | ICD-10-CM | POA: Diagnosis not present

## 2022-06-27 DIAGNOSIS — L89892 Pressure ulcer of other site, stage 2: Secondary | ICD-10-CM | POA: Diagnosis not present

## 2022-06-27 DIAGNOSIS — E039 Hypothyroidism, unspecified: Secondary | ICD-10-CM | POA: Diagnosis not present

## 2022-06-27 DIAGNOSIS — Z950 Presence of cardiac pacemaker: Secondary | ICD-10-CM | POA: Diagnosis not present

## 2022-06-27 DIAGNOSIS — E1051 Type 1 diabetes mellitus with diabetic peripheral angiopathy without gangrene: Secondary | ICD-10-CM | POA: Diagnosis not present

## 2022-06-27 DIAGNOSIS — Z95828 Presence of other vascular implants and grafts: Secondary | ICD-10-CM | POA: Diagnosis not present

## 2022-06-27 DIAGNOSIS — Z4800 Encounter for change or removal of nonsurgical wound dressing: Secondary | ICD-10-CM | POA: Diagnosis not present

## 2022-06-27 DIAGNOSIS — I872 Venous insufficiency (chronic) (peripheral): Secondary | ICD-10-CM | POA: Diagnosis not present

## 2022-06-27 DIAGNOSIS — I890 Lymphedema, not elsewhere classified: Secondary | ICD-10-CM | POA: Diagnosis not present

## 2022-06-27 DIAGNOSIS — I2510 Atherosclerotic heart disease of native coronary artery without angina pectoris: Secondary | ICD-10-CM | POA: Diagnosis not present

## 2022-06-27 DIAGNOSIS — E103519 Type 1 diabetes mellitus with proliferative diabetic retinopathy with macular edema, unspecified eye: Secondary | ICD-10-CM | POA: Diagnosis not present

## 2022-06-27 DIAGNOSIS — E10622 Type 1 diabetes mellitus with other skin ulcer: Secondary | ICD-10-CM | POA: Diagnosis not present

## 2022-06-27 DIAGNOSIS — F32A Depression, unspecified: Secondary | ICD-10-CM | POA: Diagnosis not present

## 2022-06-27 DIAGNOSIS — I130 Hypertensive heart and chronic kidney disease with heart failure and stage 1 through stage 4 chronic kidney disease, or unspecified chronic kidney disease: Secondary | ICD-10-CM | POA: Diagnosis not present

## 2022-06-27 DIAGNOSIS — K3184 Gastroparesis: Secondary | ICD-10-CM | POA: Diagnosis not present

## 2022-06-27 DIAGNOSIS — E785 Hyperlipidemia, unspecified: Secondary | ICD-10-CM | POA: Diagnosis not present

## 2022-06-27 DIAGNOSIS — D509 Iron deficiency anemia, unspecified: Secondary | ICD-10-CM | POA: Diagnosis not present

## 2022-06-27 DIAGNOSIS — Z794 Long term (current) use of insulin: Secondary | ICD-10-CM | POA: Diagnosis not present

## 2022-06-27 DIAGNOSIS — N1831 Chronic kidney disease, stage 3a: Secondary | ICD-10-CM | POA: Diagnosis not present

## 2022-06-30 ENCOUNTER — Encounter (INDEPENDENT_AMBULATORY_CARE_PROVIDER_SITE_OTHER): Payer: Medicare Other

## 2022-07-02 DIAGNOSIS — I5022 Chronic systolic (congestive) heart failure: Secondary | ICD-10-CM | POA: Diagnosis not present

## 2022-07-02 DIAGNOSIS — I890 Lymphedema, not elsewhere classified: Secondary | ICD-10-CM | POA: Diagnosis not present

## 2022-07-02 DIAGNOSIS — I130 Hypertensive heart and chronic kidney disease with heart failure and stage 1 through stage 4 chronic kidney disease, or unspecified chronic kidney disease: Secondary | ICD-10-CM | POA: Diagnosis not present

## 2022-07-02 DIAGNOSIS — L89892 Pressure ulcer of other site, stage 2: Secondary | ICD-10-CM | POA: Diagnosis not present

## 2022-07-02 DIAGNOSIS — E10622 Type 1 diabetes mellitus with other skin ulcer: Secondary | ICD-10-CM | POA: Diagnosis not present

## 2022-07-02 DIAGNOSIS — Z4800 Encounter for change or removal of nonsurgical wound dressing: Secondary | ICD-10-CM | POA: Diagnosis not present

## 2022-07-06 ENCOUNTER — Encounter (INDEPENDENT_AMBULATORY_CARE_PROVIDER_SITE_OTHER): Payer: Self-pay | Admitting: Nurse Practitioner

## 2022-07-06 ENCOUNTER — Ambulatory Visit (INDEPENDENT_AMBULATORY_CARE_PROVIDER_SITE_OTHER): Payer: Medicare Other | Admitting: Nurse Practitioner

## 2022-07-06 VITALS — BP 126/74 | HR 121 | Resp 18 | Ht 70.0 in | Wt 249.0 lb

## 2022-07-06 DIAGNOSIS — I10 Essential (primary) hypertension: Secondary | ICD-10-CM

## 2022-07-06 DIAGNOSIS — E1151 Type 2 diabetes mellitus with diabetic peripheral angiopathy without gangrene: Secondary | ICD-10-CM | POA: Diagnosis not present

## 2022-07-06 DIAGNOSIS — I890 Lymphedema, not elsewhere classified: Secondary | ICD-10-CM

## 2022-07-06 MED ORDER — DOXYCYCLINE HYCLATE 100 MG PO CAPS: 100.0000 mg | ORAL_CAPSULE | Freq: Two times a day (BID) | ORAL | 0 refills | Status: DC

## 2022-07-06 NOTE — Progress Notes (Signed)
Subjective:    [name], male    DOB: 1946-09-05, 76 y.o.   MRN: [id number] No chief complaint on file.   Gary [name] 76 year old male who presents today for lymphedema after being in bilateral Unna boots to help.  He notes that the wraps were extremely painful and then.  He has multiple areas of where he is also cut his leg.  He also has several areas where he is to have blisters.  Currently there is no weeping or bleeding.    Review of Systems  Cardiovascular:  Positive for leg swelling.  Skin:  Positive for wound.  All other systems reviewed and are negative.      Objective:   Physical Exam Vitals reviewed.  HENT:     Head: Normocephalic.  Cardiovascular:     Rate and Rhythm: Normal rate.     Pulses: Normal pulses.  Pulmonary:     Effort: Pulmonary effort is normal.  Musculoskeletal:     Right lower leg: Edema present.     Left lower leg: Edema present.  Skin:    General: Skin is warm and dry.  Neurological:     Mental Status: He is alert and oriented to person, place, and time.     Motor: Weakness present.     Gait: Gait abnormal.  Psychiatric:        Mood and Affect: Mood normal.        Behavior: Behavior normal.        Thought Content: Thought content normal.        Judgment: Judgment normal.     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    Gastroparesis    Graves disease    Hypercholesterolemia    Hypertension    Mantle cell lymphoma ([place]) 07/2014   Peripheral neuropathy    Shingles     Social History   Socioeconomic History   Marital status: Widowed    Spouse name: Not on file   Number of children: 1   Years of education: 12   Highest education level: 12th  grade  Occupational History   Occupation: Retired  Tobacco Use   Smoking status: Former   Smokeless tobacco: Current    Types: Nurse, children's Use: Never used  Substance and Sexual Activity   Alcohol use: Yes    Alcohol/week: 3.0 standard drinks of alcohol    Types: 3 Cans of beer per week    Comment: nightly   Drug use: Never   Sexual activity: Not Currently  Other Topics Concern   Not on file  Social History Narrative   Not on file   Social Determinants of Health   Financial Resource Strain: Low Risk  (03/10/2022)   Overall Financial Resource Strain (CARDIA)    Difficulty of Paying Living Expenses: Not hard at all  Food Insecurity: No Food Insecurity (03/10/2022)   Hunger Vital Sign    Worried About Running Out of Food in the Last Year: Never true    Ran Out of Food in the Last Year: Never true  Transportation Needs: Unmet Transportation Needs (  04/09/2022)   PRAPARE - Transportation    Lack of Transportation (Medical): Yes    Lack of Transportation (Non-Medical): Yes  Physical Activity: Inactive (03/10/2022)   Exercise Vital Sign    Days of Exercise per Week: 0 days    Minutes of Exercise per Session: 0 min  Stress: No Stress Concern Present (03/10/2022)   [place]    Feeling of Stress : Only a little  Social Connections: Socially Isolated (03/10/2022)   Social Connection and Isolation Panel [NHANES]    Frequency of Communication with Friends and Family: More than three times a week    Frequency of Social Gatherings with Friends and Family: Twice a week    Attends Religious Services: Never    Marine scientist or Organizations: No    Attends Archivist Meetings: Never    Marital Status: Widowed  Intimate Partner Violence: Not At Risk (03/10/2022)   Humiliation, Afraid, Rape, and Kick questionnaire    Fear of Current or Ex-Partner: No    Emotionally Abused: No    Physically  Abused: No    Sexually Abused: No    Past Surgical History:  Procedure Laterality Date   CHOLECYSTECTOMY     NEPHRECTOMY     right   PACEMAKER INSERTION N/A 12/07/2017   Procedure: INSERTION PACEMAKER DUAL CHAMBER INITIAL INSERT;  Surgeon: Isaias Cowman, MD;  Location: [facility];  Service: Cardiovascular;  Laterality: N/A;   PORTA CATH INSERTION N/A 11/03/2017   Procedure: PORTA CATH INSERTION;  Surgeon: Katha Cabal, MD;  Location: [facility];  Service: Cardiovascular;  Laterality: N/A;    Family History  Problem Relation Age of Onset   Breast cancer Mother    Diabetes Father    Cancer Father        Lymphoma   Alcohol abuse Maternal Uncle    Diabetes Sister    Heart disease Other        grandfather    Allergies  Allergen Reactions   Rofecoxib Nausea Only       Latest Ref Rng & Units 05/11/2022   10:15 AM 10/17/2021    4:24 PM 09/13/2021    6:08 AM  CBC  WBC 4.0 - 10.5 K/uL 6.0  5.2  2.8   Hemoglobin 13.0 - 17.0 g/dL 14.8  13.1  13.5   Hematocrit 39.0 - 52.0 % 43.7  39.5  40.8   Platelets 150.0 - 400.0 K/uL 154.0  149  122       CMP     Component Value Date/Time   NA 135 05/28/2022 1002   NA 138 07/15/2014 0515   K 3.6 05/28/2022 1002   K 3.5 01/16/2015 1429   CL 99 05/28/2022 1002   CL 110 (H) 07/15/2014 0515   CO2 28 05/28/2022 1002   CO2 24 07/15/2014 0515   GLUCOSE 162 (H) 05/28/2022 1002   GLUCOSE 145 (H) 07/15/2014 0515   BUN 31 (H) 05/28/2022 1002   BUN 17 07/15/2014 0515   CREATININE 1.58 (H) 05/28/2022 1002   CREATININE 1.38 (H) 10/17/2021 1624   CALCIUM 7.9 (L) 05/28/2022 1002   CALCIUM 7.1 (L) 07/15/2014 0515   PROT 6.9 05/11/2022 1015   PROT 6.2 (L) 10/22/2014 1000   ALBUMIN 4.2 05/11/2022 1015   ALBUMIN 2.6 (L) 10/22/2014 1000   AST 13 05/11/2022 1015   AST 19 10/22/2014 1000   ALT 7 05/11/2022 1015   ALT 21 10/22/2014 1000  ALKPHOS 84 05/11/2022 1015   ALKPHOS 118 (H) 10/22/2014 1000   BILITOT 0.5 05/11/2022  1015   BILITOT 0.8 10/22/2014 1000   GFRNONAA 48 (L) 09/13/2021 0608   GFRNONAA >60 01/16/2015 1429   GFRAA 41 (L) 01/24/2020 0925   GFRAA >60 01/16/2015 1429     No results found.     Assessment & Plan:   1. Lymphedema Today the patient seems to have some slightly worsening lymphedema.  I suspect this is largely because his wrap is entirely too tight.  He has developed some small shallow ulcerations.  There are some areas that are concerning for cellulitis.  We will start the patient doxycycline.  He will continue to have wraps done on a weekly basis.  We will have her return in 4 weeks for reevaluation or sooner if issues arise.  2. Primary hypertension Continue antihypertensive medications as already ordered, these medications have been reviewed and there are no changes at this time.   3. Type 2 diabetes mellitus with diabetic peripheral angiopathy without gangrene, without long-term current use of insulin (HCC) Continue hypoglycemic medications as already ordered, these medications have been reviewed and there are no changes at this time.  Hgb A1C to be monitored as already arranged by primary service    Current Outpatient Medications on File Prior to Visit  Medication Sig Dispense Refill   bacitracin 500 UNIT/GM ointment Apply 1 application topically daily.     Cholecalciferol (VITAMIN D3) 50 MCG (2000 UT) CAPS Take 2,000 Units by mouth daily.     feeding supplement, GLUCERNA SHAKE, (GLUCERNA SHAKE) LIQD Take 237 mLs by mouth 2 (two) times daily between meals. 237 mL 0   ferrous sulfate 325 (65 FE) MG tablet Take 325 mg by mouth daily with breakfast.     furosemide (LASIX) 40 MG tablet Take 1 tablet (40 mg total) by mouth daily. (Patient taking differently: Take 80 mg by mouth 2 (two) times daily. Patient taking 80 MG in the AM and 40 mg in the PM.) 30 tablet    glucose 4 GM chewable tablet Chew 1 tablet (4 g total) by mouth once as needed for low blood sugar. 50 tablet 0    insulin aspart (NOVOLOG) 100 UNIT/ML injection Inject 6 Units into the skin 3 (three) times daily with meals. (Patient taking differently: Inject 80-100 Units into the skin once. INSULIN PUMP :Patient counts carb intake and calculates dose, with meals night time if CBG is less than 200 patient holds insulin. Patient taking 80-100 units daily.)     levothyroxine (SYNTHROID) 25 MCG tablet Take 1 tablet (25 mcg total) by mouth daily before breakfast. 30 tablet 1   nystatin cream (MYCOSTATIN) APPLY TO AFFECTED AREA TWICE A DAY 30 g 1   potassium chloride (KLOR-CON) 10 MEQ tablet TAKE 1 TABLET BY MOUTH TWICE A DAY 60 tablet 1   pravastatin (PRAVACHOL) 40 MG tablet Take 1 tablet (40 mg total) by mouth daily. 30 tablet 3   vitamin B-12 (CYANOCOBALAMIN) 1000 MCG tablet Take 1,000 mcg by mouth daily.     Current Facility-Administered Medications on File Prior to Visit  Medication Dose Route Frequency Provider Last Rate Last Admin   heparin lock flush 100 unit/mL  500 Units Intravenous Once Lloyd Huger, MD       sodium chloride flush (NS) 0.9 % injection 10 mL  10 mL Intravenous PRN Lloyd Huger, MD   10 mL at 02/01/18 0829   sodium chloride flush (NS) 0.9 %  injection 10 mL  10 mL Intravenous PRN Lloyd Huger, MD   10 mL at 04/05/18 0800    There are no Patient Instructions on file for this visit. No follow-ups on file.   Kris Hartmann, NP

## 2022-07-07 ENCOUNTER — Inpatient Hospital Stay: Payer: Medicare Other | Attending: Oncology

## 2022-07-07 ENCOUNTER — Inpatient Hospital Stay: Payer: Medicare Other

## 2022-07-07 DIAGNOSIS — Z95828 Presence of other vascular implants and grafts: Secondary | ICD-10-CM

## 2022-07-07 DIAGNOSIS — Z452 Encounter for adjustment and management of vascular access device: Secondary | ICD-10-CM | POA: Diagnosis not present

## 2022-07-07 DIAGNOSIS — C8310 Mantle cell lymphoma, unspecified site: Secondary | ICD-10-CM | POA: Insufficient documentation

## 2022-07-07 MED ORDER — SODIUM CHLORIDE 0.9% FLUSH
10.0000 mL | Freq: Once | INTRAVENOUS | Status: AC
  Administered 2022-07-07: 10 mL via INTRAVENOUS
  Filled 2022-07-07: qty 10

## 2022-07-07 MED ORDER — HEPARIN SOD (PORK) LOCK FLUSH 100 UNIT/ML IV SOLN
500.0000 [IU] | Freq: Once | INTRAVENOUS | Status: AC
  Administered 2022-07-07: 500 [IU] via INTRAVENOUS
  Filled 2022-07-07: qty 5

## 2022-07-09 DIAGNOSIS — I890 Lymphedema, not elsewhere classified: Secondary | ICD-10-CM | POA: Diagnosis not present

## 2022-07-09 DIAGNOSIS — I5022 Chronic systolic (congestive) heart failure: Secondary | ICD-10-CM | POA: Diagnosis not present

## 2022-07-09 DIAGNOSIS — I130 Hypertensive heart and chronic kidney disease with heart failure and stage 1 through stage 4 chronic kidney disease, or unspecified chronic kidney disease: Secondary | ICD-10-CM | POA: Diagnosis not present

## 2022-07-09 DIAGNOSIS — Z4800 Encounter for change or removal of nonsurgical wound dressing: Secondary | ICD-10-CM | POA: Diagnosis not present

## 2022-07-09 DIAGNOSIS — L89892 Pressure ulcer of other site, stage 2: Secondary | ICD-10-CM | POA: Diagnosis not present

## 2022-07-09 DIAGNOSIS — E10622 Type 1 diabetes mellitus with other skin ulcer: Secondary | ICD-10-CM | POA: Diagnosis not present

## 2022-07-16 ENCOUNTER — Telehealth (INDEPENDENT_AMBULATORY_CARE_PROVIDER_SITE_OTHER): Payer: Self-pay

## 2022-07-16 DIAGNOSIS — I130 Hypertensive heart and chronic kidney disease with heart failure and stage 1 through stage 4 chronic kidney disease, or unspecified chronic kidney disease: Secondary | ICD-10-CM | POA: Diagnosis not present

## 2022-07-16 DIAGNOSIS — Z4800 Encounter for change or removal of nonsurgical wound dressing: Secondary | ICD-10-CM | POA: Diagnosis not present

## 2022-07-16 DIAGNOSIS — I5022 Chronic systolic (congestive) heart failure: Secondary | ICD-10-CM | POA: Diagnosis not present

## 2022-07-16 DIAGNOSIS — I890 Lymphedema, not elsewhere classified: Secondary | ICD-10-CM | POA: Diagnosis not present

## 2022-07-16 DIAGNOSIS — L89892 Pressure ulcer of other site, stage 2: Secondary | ICD-10-CM | POA: Diagnosis not present

## 2022-07-16 DIAGNOSIS — E10622 Type 1 diabetes mellitus with other skin ulcer: Secondary | ICD-10-CM | POA: Diagnosis not present

## 2022-07-16 NOTE — Telephone Encounter (Signed)
Spoke with pt and scheduled him an appt on the 11th at 2pm  for ABIs.  He states understanding

## 2022-07-16 NOTE — Telephone Encounter (Signed)
Nurse calls stating pt has a black area on his 5th toe of his left foot.  It is about 0.5cm both ways. Not sure if it is a blood blister or something vascular.  Please advise.

## 2022-07-16 NOTE — Telephone Encounter (Signed)
I suspect it is a blood blister, as he has gotten them frequently .  We have checked blood flow and it has been fine over the years but we can check abis again

## 2022-07-17 ENCOUNTER — Telehealth (INDEPENDENT_AMBULATORY_CARE_PROVIDER_SITE_OTHER): Payer: Self-pay

## 2022-07-17 NOTE — Telephone Encounter (Signed)
Called back to let us know she is using betadine on the spot on the 5th toe of left foot.  Also I let her know he is scheduled to come in for ABIs next week.  She stated understanding.

## 2022-07-21 ENCOUNTER — Telehealth: Payer: Self-pay

## 2022-07-21 ENCOUNTER — Other Ambulatory Visit (INDEPENDENT_AMBULATORY_CARE_PROVIDER_SITE_OTHER): Payer: Self-pay | Admitting: Nurse Practitioner

## 2022-07-21 DIAGNOSIS — L819 Disorder of pigmentation, unspecified: Secondary | ICD-10-CM

## 2022-07-21 NOTE — Telephone Encounter (Signed)
I called patient to see how he was doing since received the vital sign report from [facility]. Pt stated that he was feeling okay expect that he has had some upset stomach from the doxy that vein & vascular placed him on. He said that this legs were red in office & they were concerned for some infection. He said that his breathing was stable & no worsening SOB.

## 2022-07-21 NOTE — Telephone Encounter (Signed)
Pt said has no increased SOB. He has not had diarrhea last couple of days. He said that he took one imodium & he hasn't had any diarrhea issues since. He has also had no vomiting. He said that his appetite isn't the best at meals but does eat when he is hungry. He is snacking & having small meals he says. Was advised to take probiotic or make sure he is eating daily yogurt with antibiotic.

## 2022-07-21 NOTE — Telephone Encounter (Signed)
Noted.  No worsening sob.  Is he still having diarrhea.  If so, how much diarrhea?  Is he eating?  Any vomiting?  If eating and drinking - add probiotic daily while on abx and for two weeks after completing abx.  If vomiting, increased diarrhea = needs to be evaluated.

## 2022-07-22 ENCOUNTER — Ambulatory Visit (INDEPENDENT_AMBULATORY_CARE_PROVIDER_SITE_OTHER): Payer: Medicare Other

## 2022-07-22 DIAGNOSIS — L819 Disorder of pigmentation, unspecified: Secondary | ICD-10-CM | POA: Diagnosis not present

## 2022-07-23 DIAGNOSIS — Z4800 Encounter for change or removal of nonsurgical wound dressing: Secondary | ICD-10-CM | POA: Diagnosis not present

## 2022-07-23 DIAGNOSIS — E10622 Type 1 diabetes mellitus with other skin ulcer: Secondary | ICD-10-CM | POA: Diagnosis not present

## 2022-07-23 DIAGNOSIS — I890 Lymphedema, not elsewhere classified: Secondary | ICD-10-CM | POA: Diagnosis not present

## 2022-07-23 DIAGNOSIS — I130 Hypertensive heart and chronic kidney disease with heart failure and stage 1 through stage 4 chronic kidney disease, or unspecified chronic kidney disease: Secondary | ICD-10-CM | POA: Diagnosis not present

## 2022-07-23 DIAGNOSIS — I5022 Chronic systolic (congestive) heart failure: Secondary | ICD-10-CM | POA: Diagnosis not present

## 2022-07-23 DIAGNOSIS — L89892 Pressure ulcer of other site, stage 2: Secondary | ICD-10-CM | POA: Diagnosis not present

## 2022-07-25 ENCOUNTER — Other Ambulatory Visit: Payer: Self-pay | Admitting: Family

## 2022-07-27 DIAGNOSIS — K3184 Gastroparesis: Secondary | ICD-10-CM | POA: Diagnosis not present

## 2022-07-27 DIAGNOSIS — I2510 Atherosclerotic heart disease of native coronary artery without angina pectoris: Secondary | ICD-10-CM | POA: Diagnosis not present

## 2022-07-27 DIAGNOSIS — E039 Hypothyroidism, unspecified: Secondary | ICD-10-CM | POA: Diagnosis not present

## 2022-07-27 DIAGNOSIS — E1022 Type 1 diabetes mellitus with diabetic chronic kidney disease: Secondary | ICD-10-CM | POA: Diagnosis not present

## 2022-07-27 DIAGNOSIS — I5022 Chronic systolic (congestive) heart failure: Secondary | ICD-10-CM | POA: Diagnosis not present

## 2022-07-27 DIAGNOSIS — Z4800 Encounter for change or removal of nonsurgical wound dressing: Secondary | ICD-10-CM | POA: Diagnosis not present

## 2022-07-27 DIAGNOSIS — I872 Venous insufficiency (chronic) (peripheral): Secondary | ICD-10-CM | POA: Diagnosis not present

## 2022-07-27 DIAGNOSIS — D509 Iron deficiency anemia, unspecified: Secondary | ICD-10-CM | POA: Diagnosis not present

## 2022-07-27 DIAGNOSIS — L97818 Non-pressure chronic ulcer of other part of right lower leg with other specified severity: Secondary | ICD-10-CM | POA: Diagnosis not present

## 2022-07-27 DIAGNOSIS — E1043 Type 1 diabetes mellitus with diabetic autonomic (poly)neuropathy: Secondary | ICD-10-CM | POA: Diagnosis not present

## 2022-07-27 DIAGNOSIS — K5900 Constipation, unspecified: Secondary | ICD-10-CM | POA: Diagnosis not present

## 2022-07-27 DIAGNOSIS — F32A Depression, unspecified: Secondary | ICD-10-CM | POA: Diagnosis not present

## 2022-07-27 DIAGNOSIS — Z950 Presence of cardiac pacemaker: Secondary | ICD-10-CM | POA: Diagnosis not present

## 2022-07-27 DIAGNOSIS — E1059 Type 1 diabetes mellitus with other circulatory complications: Secondary | ICD-10-CM | POA: Diagnosis not present

## 2022-07-27 DIAGNOSIS — Z794 Long term (current) use of insulin: Secondary | ICD-10-CM | POA: Diagnosis not present

## 2022-07-27 DIAGNOSIS — E785 Hyperlipidemia, unspecified: Secondary | ICD-10-CM | POA: Diagnosis not present

## 2022-07-27 DIAGNOSIS — N1831 Chronic kidney disease, stage 3a: Secondary | ICD-10-CM | POA: Diagnosis not present

## 2022-07-27 DIAGNOSIS — I130 Hypertensive heart and chronic kidney disease with heart failure and stage 1 through stage 4 chronic kidney disease, or unspecified chronic kidney disease: Secondary | ICD-10-CM | POA: Diagnosis not present

## 2022-07-27 DIAGNOSIS — Z95828 Presence of other vascular implants and grafts: Secondary | ICD-10-CM | POA: Diagnosis not present

## 2022-07-27 DIAGNOSIS — E1051 Type 1 diabetes mellitus with diabetic peripheral angiopathy without gangrene: Secondary | ICD-10-CM | POA: Diagnosis not present

## 2022-07-27 DIAGNOSIS — E103519 Type 1 diabetes mellitus with proliferative diabetic retinopathy with macular edema, unspecified eye: Secondary | ICD-10-CM | POA: Diagnosis not present

## 2022-07-27 DIAGNOSIS — L97828 Non-pressure chronic ulcer of other part of left lower leg with other specified severity: Secondary | ICD-10-CM | POA: Diagnosis not present

## 2022-07-27 DIAGNOSIS — J439 Emphysema, unspecified: Secondary | ICD-10-CM | POA: Diagnosis not present

## 2022-07-27 DIAGNOSIS — I890 Lymphedema, not elsewhere classified: Secondary | ICD-10-CM | POA: Diagnosis not present

## 2022-07-27 DIAGNOSIS — H04123 Dry eye syndrome of bilateral lacrimal glands: Secondary | ICD-10-CM | POA: Diagnosis not present

## 2022-07-28 DIAGNOSIS — L97818 Non-pressure chronic ulcer of other part of right lower leg with other specified severity: Secondary | ICD-10-CM | POA: Diagnosis not present

## 2022-07-28 DIAGNOSIS — I872 Venous insufficiency (chronic) (peripheral): Secondary | ICD-10-CM | POA: Diagnosis not present

## 2022-07-28 DIAGNOSIS — I890 Lymphedema, not elsewhere classified: Secondary | ICD-10-CM | POA: Diagnosis not present

## 2022-07-28 DIAGNOSIS — Z4800 Encounter for change or removal of nonsurgical wound dressing: Secondary | ICD-10-CM | POA: Diagnosis not present

## 2022-07-28 DIAGNOSIS — L97828 Non-pressure chronic ulcer of other part of left lower leg with other specified severity: Secondary | ICD-10-CM | POA: Diagnosis not present

## 2022-07-28 DIAGNOSIS — E1059 Type 1 diabetes mellitus with other circulatory complications: Secondary | ICD-10-CM | POA: Diagnosis not present

## 2022-08-03 ENCOUNTER — Ambulatory Visit: Admission: RE | Admit: 2022-08-03 | Payer: 59 | Source: Ambulatory Visit

## 2022-08-04 ENCOUNTER — Encounter: Payer: Self-pay | Admitting: Internal Medicine

## 2022-08-04 ENCOUNTER — Encounter (INDEPENDENT_AMBULATORY_CARE_PROVIDER_SITE_OTHER): Payer: Self-pay | Admitting: Nurse Practitioner

## 2022-08-04 ENCOUNTER — Ambulatory Visit (INDEPENDENT_AMBULATORY_CARE_PROVIDER_SITE_OTHER): Payer: Medicare Other | Admitting: Nurse Practitioner

## 2022-08-04 VITALS — BP 133/76 | HR 78 | Resp 16 | Wt 251.4 lb

## 2022-08-04 DIAGNOSIS — L819 Disorder of pigmentation, unspecified: Secondary | ICD-10-CM | POA: Diagnosis not present

## 2022-08-04 DIAGNOSIS — I10 Essential (primary) hypertension: Secondary | ICD-10-CM | POA: Diagnosis not present

## 2022-08-04 DIAGNOSIS — I890 Lymphedema, not elsewhere classified: Secondary | ICD-10-CM

## 2022-08-05 DIAGNOSIS — L97828 Non-pressure chronic ulcer of other part of left lower leg with other specified severity: Secondary | ICD-10-CM | POA: Diagnosis not present

## 2022-08-05 DIAGNOSIS — I5022 Chronic systolic (congestive) heart failure: Secondary | ICD-10-CM | POA: Diagnosis not present

## 2022-08-05 DIAGNOSIS — E1043 Type 1 diabetes mellitus with diabetic autonomic (poly)neuropathy: Secondary | ICD-10-CM | POA: Diagnosis not present

## 2022-08-05 DIAGNOSIS — K5900 Constipation, unspecified: Secondary | ICD-10-CM

## 2022-08-05 DIAGNOSIS — J439 Emphysema, unspecified: Secondary | ICD-10-CM

## 2022-08-05 DIAGNOSIS — K3184 Gastroparesis: Secondary | ICD-10-CM

## 2022-08-05 DIAGNOSIS — Z8572 Personal history of non-Hodgkin lymphomas: Secondary | ICD-10-CM

## 2022-08-05 DIAGNOSIS — I2510 Atherosclerotic heart disease of native coronary artery without angina pectoris: Secondary | ICD-10-CM

## 2022-08-05 DIAGNOSIS — Z9641 Presence of insulin pump (external) (internal): Secondary | ICD-10-CM

## 2022-08-05 DIAGNOSIS — E785 Hyperlipidemia, unspecified: Secondary | ICD-10-CM

## 2022-08-05 DIAGNOSIS — Z950 Presence of cardiac pacemaker: Secondary | ICD-10-CM

## 2022-08-05 DIAGNOSIS — Z4800 Encounter for change or removal of nonsurgical wound dressing: Secondary | ICD-10-CM | POA: Diagnosis not present

## 2022-08-05 DIAGNOSIS — E103519 Type 1 diabetes mellitus with proliferative diabetic retinopathy with macular edema, unspecified eye: Secondary | ICD-10-CM

## 2022-08-05 DIAGNOSIS — I130 Hypertensive heart and chronic kidney disease with heart failure and stage 1 through stage 4 chronic kidney disease, or unspecified chronic kidney disease: Secondary | ICD-10-CM | POA: Diagnosis not present

## 2022-08-05 DIAGNOSIS — D509 Iron deficiency anemia, unspecified: Secondary | ICD-10-CM

## 2022-08-05 DIAGNOSIS — I872 Venous insufficiency (chronic) (peripheral): Secondary | ICD-10-CM | POA: Diagnosis not present

## 2022-08-05 DIAGNOSIS — I495 Sick sinus syndrome: Secondary | ICD-10-CM | POA: Diagnosis not present

## 2022-08-05 DIAGNOSIS — H04123 Dry eye syndrome of bilateral lacrimal glands: Secondary | ICD-10-CM

## 2022-08-05 DIAGNOSIS — Z95828 Presence of other vascular implants and grafts: Secondary | ICD-10-CM

## 2022-08-05 DIAGNOSIS — F32A Depression, unspecified: Secondary | ICD-10-CM

## 2022-08-05 DIAGNOSIS — E1022 Type 1 diabetes mellitus with diabetic chronic kidney disease: Secondary | ICD-10-CM | POA: Diagnosis not present

## 2022-08-05 DIAGNOSIS — L97818 Non-pressure chronic ulcer of other part of right lower leg with other specified severity: Secondary | ICD-10-CM | POA: Diagnosis not present

## 2022-08-05 DIAGNOSIS — N1831 Chronic kidney disease, stage 3a: Secondary | ICD-10-CM | POA: Diagnosis not present

## 2022-08-05 DIAGNOSIS — E039 Hypothyroidism, unspecified: Secondary | ICD-10-CM

## 2022-08-05 DIAGNOSIS — Z9181 History of falling: Secondary | ICD-10-CM

## 2022-08-05 DIAGNOSIS — Z794 Long term (current) use of insulin: Secondary | ICD-10-CM

## 2022-08-05 DIAGNOSIS — E1059 Type 1 diabetes mellitus with other circulatory complications: Secondary | ICD-10-CM | POA: Diagnosis not present

## 2022-08-05 DIAGNOSIS — E1051 Type 1 diabetes mellitus with diabetic peripheral angiopathy without gangrene: Secondary | ICD-10-CM | POA: Diagnosis not present

## 2022-08-05 DIAGNOSIS — I890 Lymphedema, not elsewhere classified: Secondary | ICD-10-CM | POA: Diagnosis not present

## 2022-08-06 ENCOUNTER — Ambulatory Visit: Payer: Medicare Other | Admitting: Oncology

## 2022-08-06 ENCOUNTER — Other Ambulatory Visit: Payer: Medicare Other

## 2022-08-06 DIAGNOSIS — L97828 Non-pressure chronic ulcer of other part of left lower leg with other specified severity: Secondary | ICD-10-CM | POA: Diagnosis not present

## 2022-08-06 DIAGNOSIS — Z4800 Encounter for change or removal of nonsurgical wound dressing: Secondary | ICD-10-CM | POA: Diagnosis not present

## 2022-08-06 DIAGNOSIS — E1059 Type 1 diabetes mellitus with other circulatory complications: Secondary | ICD-10-CM | POA: Diagnosis not present

## 2022-08-06 DIAGNOSIS — I890 Lymphedema, not elsewhere classified: Secondary | ICD-10-CM | POA: Diagnosis not present

## 2022-08-06 DIAGNOSIS — I872 Venous insufficiency (chronic) (peripheral): Secondary | ICD-10-CM | POA: Diagnosis not present

## 2022-08-06 DIAGNOSIS — L97818 Non-pressure chronic ulcer of other part of right lower leg with other specified severity: Secondary | ICD-10-CM | POA: Diagnosis not present

## 2022-08-07 ENCOUNTER — Ambulatory Visit
Admission: RE | Admit: 2022-08-07 | Discharge: 2022-08-07 | Disposition: A | Discharge status: Home / Self Care | Payer: Medicare Other | Source: Ambulatory Visit | Attending: Oncology | Admitting: Oncology

## 2022-08-07 ENCOUNTER — Inpatient Hospital Stay: Payer: Medicare Other | Attending: Oncology

## 2022-08-07 ENCOUNTER — Other Ambulatory Visit: Payer: Self-pay

## 2022-08-07 DIAGNOSIS — Z807 Family history of other malignant neoplasms of lymphoid, hematopoietic and related tissues: Secondary | ICD-10-CM | POA: Insufficient documentation

## 2022-08-07 DIAGNOSIS — C8318 Mantle cell lymphoma, lymph nodes of multiple sites: Secondary | ICD-10-CM | POA: Insufficient documentation

## 2022-08-07 DIAGNOSIS — C8310 Mantle cell lymphoma, unspecified site: Secondary | ICD-10-CM | POA: Insufficient documentation

## 2022-08-07 DIAGNOSIS — N189 Chronic kidney disease, unspecified: Secondary | ICD-10-CM | POA: Insufficient documentation

## 2022-08-07 DIAGNOSIS — Z803 Family history of malignant neoplasm of breast: Secondary | ICD-10-CM | POA: Insufficient documentation

## 2022-08-07 DIAGNOSIS — R590 Localized enlarged lymph nodes: Secondary | ICD-10-CM | POA: Diagnosis not present

## 2022-08-07 DIAGNOSIS — D696 Thrombocytopenia, unspecified: Secondary | ICD-10-CM | POA: Insufficient documentation

## 2022-08-07 DIAGNOSIS — J432 Centrilobular emphysema: Secondary | ICD-10-CM | POA: Diagnosis not present

## 2022-08-07 DIAGNOSIS — E1142 Type 2 diabetes mellitus with diabetic polyneuropathy: Secondary | ICD-10-CM | POA: Insufficient documentation

## 2022-08-07 DIAGNOSIS — Z794 Long term (current) use of insulin: Secondary | ICD-10-CM | POA: Insufficient documentation

## 2022-08-07 DIAGNOSIS — I130 Hypertensive heart and chronic kidney disease with heart failure and stage 1 through stage 4 chronic kidney disease, or unspecified chronic kidney disease: Secondary | ICD-10-CM | POA: Insufficient documentation

## 2022-08-07 DIAGNOSIS — I700 Atherosclerosis of aorta: Secondary | ICD-10-CM | POA: Diagnosis not present

## 2022-08-07 DIAGNOSIS — I509 Heart failure, unspecified: Secondary | ICD-10-CM | POA: Insufficient documentation

## 2022-08-07 DIAGNOSIS — N281 Cyst of kidney, acquired: Secondary | ICD-10-CM | POA: Diagnosis not present

## 2022-08-07 LAB — POCT I-STAT CREATININE: Creatinine, Ser: 1.7 mg/dL — ABNORMAL HIGH (ref 0.61–1.24)

## 2022-08-07 MED ORDER — POTASSIUM CHLORIDE ER 10 MEQ PO TBCR: 10.0000 meq | EXTENDED_RELEASE_TABLET | Freq: Two times a day (BID) | ORAL | 1 refills | Status: DC

## 2022-08-07 MED ORDER — IOHEXOL 300 MG/ML  SOLN
100.0000 mL | Freq: Once | INTRAMUSCULAR | Status: AC | PRN
  Administered 2022-08-07: 80 mL via INTRAVENOUS

## 2022-08-09 ENCOUNTER — Encounter (INDEPENDENT_AMBULATORY_CARE_PROVIDER_SITE_OTHER): Payer: Self-pay | Admitting: Nurse Practitioner

## 2022-08-09 NOTE — Progress Notes (Signed)
Subjective:    [name], male    DOB: 1946-10-01, 76 y.o.   MRN: [id number] Chief Complaint  Patient presents with   Follow-up    4 week unna boot follow up    [name] is a 76 year old male who presents today for follow-up evaluation of lower extremity edema following Unna wraps done by home health.  The patient is tolerating the wraps well.  There are some worsening swelling in areas where the wrap was removed due to being too tight but overall is doing well.  The patient recently had ABIs done due to concern for blister on his toe.  These ABIs were normal triphasic waveforms which is consistent with his previous studies.    Review of Systems  Cardiovascular:  Positive for leg swelling.  All other systems reviewed and are negative.      Objective:   Physical Exam Vitals reviewed.  HENT:     Head: Normocephalic.  Cardiovascular:     Rate and Rhythm: Normal rate.  Pulmonary:     Effort: Pulmonary effort is normal.  Musculoskeletal:     Right lower leg: Edema present.     Left lower leg: Edema present.  Skin:    General: Skin is warm and dry.  Neurological:     Mental Status: He is alert and oriented to person, place, and time.  Psychiatric:        Mood and Affect: Mood normal.        Behavior: Behavior normal.        Thought Content: Thought content normal.        Judgment: Judgment normal.     BP 133/76 (BP Location: Right Arm)   Pulse 78   Resp 16   Wt 251 lb 6.4 oz (114 kg)   BMI 36.07 kg/m   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    Gastroparesis    Graves disease    Hypercholesterolemia    Hypertension    Mantle cell lymphoma ([place]) 07/2014   Peripheral neuropathy    Shingles     Social History   Socioeconomic History   Marital status: Widowed    Spouse name: Not on file   Number  of children: 1   Years of education: 4   Highest education level: 12th grade  Occupational History   Occupation: Retired  Tobacco Use   Smoking status: Former   Smokeless tobacco: Current    Types: Nurse, children's Use: Never used  Substance and Sexual Activity   Alcohol use: Yes    Alcohol/week: 3.0 standard drinks of alcohol    Types: 3 Cans of beer per week    Comment: nightly   Drug use: Never   Sexual activity: Not Currently  Other Topics Concern   Not on file  Social History Narrative   Not on file   Social Determinants of Health   Financial Resource Strain: Low Risk  (03/10/2022)   Overall Financial Resource Strain (CARDIA)    Difficulty of Paying Living Expenses: Not hard at all  Food Insecurity: No Food Insecurity (03/10/2022)   Hunger Vital Sign    Worried About Running Out of Food in the Last Year: Never true    Ran Out of Food in the Last Year: Never true  Transportation Needs:  Unmet Transportation Needs (04/09/2022)   PRAPARE - Transportation    Lack of Transportation (Medical): Yes    Lack of Transportation (Non-Medical): Yes  Physical Activity: Inactive (03/10/2022)   Exercise Vital Sign    Days of Exercise per Week: 0 days    Minutes of Exercise per Session: 0 min  Stress: No Stress Concern Present (03/10/2022)   [place]    Feeling of Stress : Only a little  Social Connections: Socially Isolated (03/10/2022)   Social Connection and Isolation Panel [NHANES]    Frequency of Communication with Friends and Family: More than three times a week    Frequency of Social Gatherings with Friends and Family: Twice a week    Attends Religious Services: Never    Marine scientist or Organizations: No    Attends Archivist Meetings: Never    Marital Status: Widowed  Intimate Partner Violence: Not At Risk (03/10/2022)   Humiliation, Afraid, Rape, and Kick questionnaire    Fear  of Current or Ex-Partner: No    Emotionally Abused: No    Physically Abused: No    Sexually Abused: No    Past Surgical History:  Procedure Laterality Date   CHOLECYSTECTOMY     NEPHRECTOMY     right   PACEMAKER INSERTION N/A 12/07/2017   Procedure: INSERTION PACEMAKER DUAL CHAMBER INITIAL INSERT;  Surgeon: Isaias Cowman, MD;  Location: [facility];  Service: Cardiovascular;  Laterality: N/A;   PORTA CATH INSERTION N/A 11/03/2017   Procedure: PORTA CATH INSERTION;  Surgeon: Katha Cabal, MD;  Location: [place] [facility];  Service: Cardiovascular;  Laterality: N/A;    Family History  Problem Relation Age of Onset   Breast cancer Mother    Diabetes Father    Cancer Father        Lymphoma   Alcohol abuse Maternal Uncle    Diabetes Sister    Heart disease Other        grandfather    Allergies  Allergen Reactions   Rofecoxib Nausea Only       Latest Ref Rng & Units 05/11/2022   10:15 AM 10/17/2021    4:24 PM 09/13/2021    6:08 AM  CBC  WBC 4.0 - 10.5 K/uL 6.0  5.2  2.8   Hemoglobin 13.0 - 17.0 g/dL 14.8  13.1  13.5   Hematocrit 39.0 - 52.0 % 43.7  39.5  40.8   Platelets 150.0 - 400.0 K/uL 154.0  149  122       CMP     Component Value Date/Time   NA 135 05/28/2022 1002   NA 138 07/15/2014 0515   K 3.6 05/28/2022 1002   K 3.5 01/16/2015 1429   CL 99 05/28/2022 1002   CL 110 (H) 07/15/2014 0515   CO2 28 05/28/2022 1002   CO2 24 07/15/2014 0515   GLUCOSE 162 (H) 05/28/2022 1002   GLUCOSE 145 (H) 07/15/2014 0515   BUN 31 (H) 05/28/2022 1002   BUN 17 07/15/2014 0515   CREATININE 1.70 (H) 08/07/2022 1404   CREATININE 1.38 (H) 10/17/2021 1624   CALCIUM 7.9 (L) 05/28/2022 1002   CALCIUM 7.1 (L) 07/15/2014 0515   PROT 6.9 05/11/2022 1015   PROT 6.2 (L) 10/22/2014 1000   ALBUMIN 4.2 05/11/2022 1015   ALBUMIN 2.6 (L) 10/22/2014 1000   AST 13 05/11/2022 1015   AST 19 10/22/2014 1000   ALT 7 05/11/2022 1015   ALT  21 10/22/2014 1000   ALKPHOS 84  05/11/2022 1015   ALKPHOS 118 (H) 10/22/2014 1000   BILITOT 0.5 05/11/2022 1015   BILITOT 0.8 10/22/2014 1000   GFRNONAA 48 (L) 09/13/2021 0608   GFRNONAA >60 01/16/2015 1429   GFRAA 41 (L) 01/24/2020 0925   GFRAA >60 01/16/2015 1429     VAS [place] ABI WITH/WO TBI  Result Date: 07/28/2022  LOWER EXTREMITY DOPPLER STUDY Patient Name:  Gary [name]  Date of Exam:   07/22/2022 Medical Rec #: [id number]     Accession #:    [id number] Date of Birth: [birth year]     Patient Gender: M Patient Age:   69 years Exam Location:  [facility] Procedure:      VAS [place] ABI WITH/WO TBI Referring Phys: [name] --------------------------------------------------------------------------------  Indications: Ulceration, and edema. High Risk Factors: Hypertension, past history of smoking, coronary artery                    disease.  Performing Technologist: Delorise Shiner RVT  Examination Guidelines: A complete evaluation includes at minimum, Doppler waveform signals and systolic blood pressure reading at the level of bilateral brachial, anterior tibial, and posterior tibial arteries, when vessel segments are accessible. Bilateral testing is considered an integral part of a complete examination. Photoelectric Plethysmograph (PPG) waveforms and toe systolic pressure readings are included as required and additional duplex testing as needed. Limited examinations for reoccurring indications may be performed as noted.  ABI Findings: +---------+------------------+-----+---------+--------+ Right    Rt Pressure (mmHg)IndexWaveform Comment  +---------+------------------+-----+---------+--------+ Brachial 124                                      +---------+------------------+-----+---------+--------+ ATA      128               1.03 triphasic         +---------+------------------+-----+---------+--------+ PTA      144               1.16 triphasic          +---------+------------------+-----+---------+--------+ Great Toe104               0.84                   +---------+------------------+-----+---------+--------+ +---------+------------------+-----+---------+-------+ Left     Lt Pressure (mmHg)IndexWaveform Comment +---------+------------------+-----+---------+-------+ Brachial 119                                     +---------+------------------+-----+---------+-------+ ATA      139               1.12 triphasic        +---------+------------------+-----+---------+-------+ PTA      145               1.17 triphasic        +---------+------------------+-----+---------+-------+ Great Toe123               0.99                  +---------+------------------+-----+---------+-------+ +-------+-----------+-----------+------------+------------+ ABI/TBIToday's ABIToday's TBIPrevious ABIPrevious TBI +-------+-----------+-----------+------------+------------+ Right  1.16       0.84       1.22        1.14         +-------+-----------+-----------+------------+------------+ Left  1.17       0.99       1.40        1.19         +-------+-----------+-----------+------------+------------+ Bilateral ABIs appear essentially unchanged compared to prior study on 10/11/2019.  Summary: Right: Resting right ankle-brachial index is within normal range. The right toe-brachial index is normal. Left: Resting left ankle-brachial index is within normal range. The left toe-brachial index is normal. *See table(s) above for measurements and observations.  Electronically signed by Leotis Pain MD on 07/28/2022 at 3:56:45 PM.    Final        Assessment & Plan:   1. Lymphedema The patient is improved with lymphedema.  He is tolerating the Unna boots well and the wrapping is better than previous.  He will continue to have them done weekly by home health.  We will reevaluate progress in 4 weeks.  2. Discoloration of skin of toe I believe this  to be a blood blister as the patient has had many times before.  These are usually caused by irritation from his shoe and not utilizing socks.  We will cover this area and try to continue to keep it covered as it helps with healing.  3. Primary hypertension Continue antihypertensive medications as already ordered, these medications have been reviewed and there are no changes at this time.    Current Outpatient Medications on File Prior to Visit  Medication Sig Dispense Refill   bacitracin 500 UNIT/GM ointment Apply 1 application topically daily.     Cholecalciferol (VITAMIN D3) 50 MCG (2000 UT) CAPS Take 2,000 Units by mouth daily.     doxycycline (VIBRAMYCIN) 100 MG capsule Take 1 capsule (100 mg total) by mouth 2 (two) times daily. 20 capsule 0   feeding supplement, GLUCERNA SHAKE, (GLUCERNA SHAKE) LIQD Take 237 mLs by mouth 2 (two) times daily between meals. 237 mL 0   ferrous sulfate 325 (65 FE) MG tablet Take 325 mg by mouth daily with breakfast.     furosemide (LASIX) 40 MG tablet Take 1 tablet (40 mg total) by mouth daily. (Patient taking differently: Take 80 mg by mouth 2 (two) times daily. Patient taking 80 MG in the AM and 40 mg in the PM.) 30 tablet    glucose 4 GM chewable tablet Chew 1 tablet (4 g total) by mouth once as needed for low blood sugar. 50 tablet 0   insulin aspart (NOVOLOG) 100 UNIT/ML injection Inject 6 Units into the skin 3 (three) times daily with meals. (Patient taking differently: Inject 80-100 Units into the skin once. INSULIN PUMP :Patient counts carb intake and calculates dose, with meals night time if CBG is less than 200 patient holds insulin. Patient taking 80-100 units daily.)     levothyroxine (SYNTHROID) 25 MCG tablet Take 1 tablet (25 mcg total) by mouth daily before breakfast. 30 tablet 1   nystatin cream (MYCOSTATIN) APPLY TO AFFECTED AREA TWICE A DAY 30 g 1   pravastatin (PRAVACHOL) 40 MG tablet Take 1 tablet (40 mg total) by mouth daily. 30 tablet 3    vitamin B-12 (CYANOCOBALAMIN) 1000 MCG tablet Take 1,000 mcg by mouth daily.     Current Facility-Administered Medications on File Prior to Visit  Medication Dose Route Frequency Provider Last Rate Last Admin   heparin lock flush 100 unit/mL  500 Units Intravenous Once Lloyd Huger, MD       sodium chloride flush (NS) 0.9 % injection 10 mL  10 mL Intravenous PRN  Lloyd Huger, MD   10 mL at 02/01/18 3159   sodium chloride flush (NS) 0.9 % injection 10 mL  10 mL Intravenous PRN Lloyd Huger, MD   10 mL at 04/05/18 0800    There are no Patient Instructions on file for this visit. No follow-ups on file.   Kris Hartmann, NP

## 2022-08-10 ENCOUNTER — Inpatient Hospital Stay (HOSPITAL_BASED_OUTPATIENT_CLINIC_OR_DEPARTMENT_OTHER): Payer: Medicare Other | Admitting: Oncology

## 2022-08-10 ENCOUNTER — Encounter: Payer: Self-pay | Admitting: Oncology

## 2022-08-10 ENCOUNTER — Telehealth: Payer: Self-pay | Admitting: *Deleted

## 2022-08-10 ENCOUNTER — Inpatient Hospital Stay: Payer: Medicare Other

## 2022-08-10 ENCOUNTER — Encounter (INDEPENDENT_AMBULATORY_CARE_PROVIDER_SITE_OTHER): Payer: Self-pay

## 2022-08-10 ENCOUNTER — Other Ambulatory Visit: Payer: Self-pay

## 2022-08-10 VITALS — BP 114/68 | HR 85 | Temp 96.8°F | Resp 18 | Wt 252.0 lb

## 2022-08-10 DIAGNOSIS — Z807 Family history of other malignant neoplasms of lymphoid, hematopoietic and related tissues: Secondary | ICD-10-CM | POA: Diagnosis not present

## 2022-08-10 DIAGNOSIS — Z803 Family history of malignant neoplasm of breast: Secondary | ICD-10-CM | POA: Diagnosis not present

## 2022-08-10 DIAGNOSIS — D696 Thrombocytopenia, unspecified: Secondary | ICD-10-CM | POA: Diagnosis not present

## 2022-08-10 DIAGNOSIS — Z794 Long term (current) use of insulin: Secondary | ICD-10-CM | POA: Diagnosis not present

## 2022-08-10 DIAGNOSIS — I130 Hypertensive heart and chronic kidney disease with heart failure and stage 1 through stage 4 chronic kidney disease, or unspecified chronic kidney disease: Secondary | ICD-10-CM | POA: Diagnosis not present

## 2022-08-10 DIAGNOSIS — E1142 Type 2 diabetes mellitus with diabetic polyneuropathy: Secondary | ICD-10-CM | POA: Diagnosis not present

## 2022-08-10 DIAGNOSIS — C8318 Mantle cell lymphoma, lymph nodes of multiple sites: Secondary | ICD-10-CM | POA: Diagnosis not present

## 2022-08-10 DIAGNOSIS — C8310 Mantle cell lymphoma, unspecified site: Secondary | ICD-10-CM | POA: Diagnosis not present

## 2022-08-10 DIAGNOSIS — N189 Chronic kidney disease, unspecified: Secondary | ICD-10-CM | POA: Diagnosis not present

## 2022-08-10 DIAGNOSIS — I509 Heart failure, unspecified: Secondary | ICD-10-CM | POA: Diagnosis not present

## 2022-08-10 LAB — CBC WITH DIFFERENTIAL/PLATELET
Abs Immature Granulocytes: 0.05 10*3/uL (ref 0.00–0.07)
Basophils Absolute: 0.1 10*3/uL (ref 0.0–0.1)
Basophils Relative: 1 %
Eosinophils Absolute: 0.1 10*3/uL (ref 0.0–0.5)
Eosinophils Relative: 2 %
HCT: 44.2 % (ref 39.0–52.0)
Hemoglobin: 14.7 g/dL (ref 13.0–17.0)
Immature Granulocytes: 1 %
Lymphocytes Relative: 17 %
Lymphs Abs: 1 10*3/uL (ref 0.7–4.0)
MCH: 30.8 pg (ref 26.0–34.0)
MCHC: 33.3 g/dL (ref 30.0–36.0)
MCV: 92.7 fL (ref 80.0–100.0)
Monocytes Absolute: 0.6 10*3/uL (ref 0.1–1.0)
Monocytes Relative: 9 %
Neutro Abs: 4.2 10*3/uL (ref 1.7–7.7)
Neutrophils Relative %: 70 %
Platelets: 113 10*3/uL — ABNORMAL LOW (ref 150–400)
RBC: 4.77 MIL/uL (ref 4.22–5.81)
RDW: 12.9 % (ref 11.5–15.5)
WBC: 5.9 10*3/uL (ref 4.0–10.5)
nRBC: 0 % (ref 0.0–0.2)

## 2022-08-10 LAB — COMPREHENSIVE METABOLIC PANEL
ALT: 10 U/L (ref 0–44)
AST: 21 U/L (ref 15–41)
Albumin: 3.5 g/dL (ref 3.5–5.0)
Alkaline Phosphatase: 82 U/L (ref 38–126)
Anion gap: 6 (ref 5–15)
BUN: 23 mg/dL (ref 8–23)
CO2: 22 mmol/L (ref 22–32)
Calcium: 7.7 mg/dL — ABNORMAL LOW (ref 8.9–10.3)
Chloride: 107 mmol/L (ref 98–111)
Creatinine, Ser: 1.72 mg/dL — ABNORMAL HIGH (ref 0.61–1.24)
GFR, Estimated: 41 mL/min — ABNORMAL LOW (ref >60)
Glucose, Bld: 202 mg/dL — ABNORMAL HIGH (ref 70–99)
Potassium: 3.7 mmol/L (ref 3.5–5.1)
Sodium: 135 mmol/L (ref 135–145)
Total Bilirubin: 0.5 mg/dL (ref 0.3–1.2)
Total Protein: 6.7 g/dL (ref 6.5–8.1)

## 2022-08-10 NOTE — Progress Notes (Signed)
Patient here for oncology follow-up appointment,  concerns of mole on R shoulder

## 2022-08-10 NOTE — Progress Notes (Signed)
[name]  [phone] [phone]  ID: Gary [name] OB: 1946-03-09  MR#: [id number]  FBP#:[id number]  Patient Care Team: [name], MD as PCP - General (Internal Medicine) Lloyd Huger, MD as Consulting Physician (Oncology) Lucky Cowboy Erskine Squibb, MD as Referring Physician (Vascular Surgery) Schnier, Dolores Lory, MD (Vascular Surgery) Corey Skains, MD as Consulting Physician (Cardiology)  CHIEF COMPLAINT: Mantle cell lymphoma.  INTERVAL HISTORY: Patient returns to clinic today for routine yearly evaluation and discussion of his imaging results.  He continues to feel well and remains asymptomatic.  He does not complain of weakness or fatigue today.  He denies any fevers, chills, or night sweats.  He has no neurologic complaints.  He denies any chest pain, shortness of breath, cough, or hemoptysis.  He has no nausea, vomiting, constipation, or diarrhea.  He denies any melena or hematochezia.  He has no urinary complaints.  Patient offers no specific complaints today.  Review of Systems  Constitutional: Negative.  Negative for diaphoresis, fever, malaise/fatigue and weight loss.  Respiratory: Negative.  Negative for cough, hemoptysis and shortness of breath.   Cardiovascular: Negative.  Negative for chest pain and leg swelling.  Gastrointestinal: Negative.  Negative for abdominal pain.  Genitourinary: Negative.  Negative for dysuria.  Musculoskeletal: Negative.  Negative for back pain.  Skin: Negative.  Negative for rash.  Neurological: Negative.  Negative for dizziness, focal weakness, weakness and headaches.  Psychiatric/Behavioral: Negative.  The patient is not nervous/anxious.     As per HPI. Otherwise, a complete review of systems is negative.  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  with macular edema    Gastroparesis    Graves disease    Hypercholesterolemia    Hypertension    Mantle cell lymphoma ([place]) 07/2014   Peripheral neuropathy    Shingles     PAST SURGICAL HISTORY: Past Surgical History:  Procedure Laterality Date   CHOLECYSTECTOMY     NEPHRECTOMY     right   PACEMAKER INSERTION N/A 12/07/2017   Procedure: INSERTION PACEMAKER DUAL CHAMBER INITIAL INSERT;  Surgeon: Isaias Cowman, MD;  Location: [facility];  Service: Cardiovascular;  Laterality: N/A;   PORTA CATH INSERTION N/A 11/03/2017   Procedure: PORTA CATH INSERTION;  Surgeon: Katha Cabal, MD;  Location: [facility];  Service: Cardiovascular;  Laterality: N/A;    FAMILY HISTORY Family History  Problem Relation Age of Onset   Breast cancer Mother    Diabetes Father    Cancer Father        Lymphoma   Alcohol abuse Maternal Uncle    Diabetes Sister    Heart disease Other        grandfather       ADVANCED DIRECTIVES:    HEALTH MAINTENANCE: Social History   Tobacco Use   Smoking status: Former   Smokeless tobacco: Current    Types: Chew  Vaping Use   Vaping Use: Never used  Substance Use Topics   Alcohol use: Yes    Alcohol/week: 3.0 standard drinks of alcohol    Types: 3 Cans of beer per week    Comment: nightly   Drug use: Never     Colonoscopy:  PAP:  Bone density:  Lipid panel:  Allergies  Allergen Reactions   Rofecoxib Nausea Only    Current Outpatient Medications  Medication Sig Dispense Refill   bacitracin 500 UNIT/GM ointment Apply 1 application topically daily.     Cholecalciferol (VITAMIN D3) 50 MCG (2000 UT) CAPS Take 2,000 Units by mouth daily.     doxycycline (VIBRAMYCIN) 100 MG capsule Take 1 capsule (100 mg total) by mouth 2 (two) times daily. 20 capsule 0   feeding supplement, GLUCERNA SHAKE, (GLUCERNA SHAKE) LIQD Take 237 mLs by mouth 2 (two) times daily between meals. 237 mL 0   ferrous sulfate 325 (65 FE) MG tablet Take 325 mg by  mouth daily with breakfast.     furosemide (LASIX) 40 MG tablet Take 1 tablet (40 mg total) by mouth daily. (Patient taking differently: Take 80 mg by mouth 2 (two) times daily. Patient taking 80 MG in the AM and 40 mg in the PM.) 30 tablet    glucose 4 GM chewable tablet Chew 1 tablet (4 g total) by mouth once as needed for low blood sugar. 50 tablet 0   insulin aspart (NOVOLOG) 100 UNIT/ML injection Inject 6 Units into the skin 3 (three) times daily with meals. (Patient taking differently: Inject 80-100 Units into the skin once. INSULIN PUMP :Patient counts carb intake and calculates dose, with meals night time if CBG is less than 200 patient holds insulin. Patient taking 80-100 units daily.)     levothyroxine (SYNTHROID) 25 MCG tablet Take 1 tablet (25 mcg total) by mouth daily before breakfast. 30 tablet 1   nystatin cream (MYCOSTATIN) APPLY TO AFFECTED AREA TWICE A DAY 30 g 1   potassium chloride (KLOR-CON) 10 MEQ tablet Take 1 tablet (10 mEq total) by mouth 2 (two) times daily. 60 tablet 1   pravastatin (PRAVACHOL) 40 MG tablet Take 1 tablet (40 mg total) by mouth daily. 30 tablet 3   vitamin B-12 (CYANOCOBALAMIN) 1000 MCG tablet Take 1,000 mcg by mouth daily.     No current facility-administered medications for this visit.   Facility-Administered Medications Ordered in Other Visits  Medication Dose Route Frequency Provider Last Rate Last Admin   heparin lock flush 100 unit/mL  500 Units Intravenous Once Lloyd Huger, MD       sodium chloride flush (NS) 0.9 % injection 10 mL  10 mL Intravenous PRN Lloyd Huger, MD   10 mL at 02/01/18 0829   sodium chloride flush (NS) 0.9 % injection 10 mL  10 mL Intravenous PRN Lloyd Huger, MD   10 mL at 04/05/18 0800    OBJECTIVE: Vitals:   08/10/22 1038  BP: 114/68  Pulse: 85  Resp: 18  Temp: (!) 96.8 F (36 C)  SpO2: 96%     Body mass index is 36.16 kg/m.    ECOG FS:0 - Asymptomatic  General: Well-developed,  well-nourished, no acute distress. Eyes: Pink conjunctiva, anicteric sclera. HEENT: Normocephalic, moist mucous membranes. Lungs: No audible wheezing or coughing. Heart: Regular rate and rhythm. Abdomen: Soft, nontender, no obvious distention. Musculoskeletal: No edema, cyanosis, or clubbing. Neuro: Alert, answering all questions appropriately. Cranial nerves grossly intact. Skin: No rashes or petechiae noted. Psych: Normal affect. Lymphatics: No palpable lymphadenopathy.  LAB RESULTS:  Lab Results  Component Value Date   NA 135 08/10/2022   K 3.7 08/10/2022   CL 107 08/10/2022   CO2 22 08/10/2022   GLUCOSE 202 (H) 08/10/2022   BUN 23 08/10/2022   CREATININE 1.72 (H) 08/10/2022   CALCIUM 7.7 (L) 08/10/2022   PROT 6.7 08/10/2022   ALBUMIN 3.5 08/10/2022   AST 21 08/10/2022  ALT 10 08/10/2022   ALKPHOS 82 08/10/2022   BILITOT 0.5 08/10/2022   GFRNONAA 41 (L) 08/10/2022   GFRAA 41 (L) 01/24/2020    Lab Results  Component Value Date   WBC 5.9 08/10/2022   NEUTROABS 4.2 08/10/2022   HGB 14.7 08/10/2022   HCT 44.2 08/10/2022   MCV 92.7 08/10/2022   PLT 113 (L) 08/10/2022   Lab Results  Component Value Date   IRON 13 (L) 08/11/2017   TIBC 223 (L) 01/12/2017   IRONPCTSAT 3.8 (L) 08/11/2017   Lab Results  Component Value Date   FERRITIN 42.0 08/11/2017     STUDIES:   ASSESSMENT: Low-grade mantle cell lymphoma diagnosed in September 2015, iron deficiency anemia.  PLAN:    1. Mantle cell lymphoma, stage III: Patient's initial diagnosis was on inguinal lymph node biopsy on July 12, 2014.  Patient was noted to have progressive disease and underwent chemotherapy using Rituxan and Treanda completing cycle 4 on Mar 09, 2018.  His most recent CT scan on August 07, 2022 reviewed independently and report as above with clearly progressive disease with increasing lymphadenopathy.  We will get a PET scan in the next 1 to 2 weeks to confirm recurrence and then patient will  follow-up 2 to 3 days later to discuss the results and treatment planning.    2. Decreased ejection fraction: Improved.  Patient's most recent cardiac echo in November 2020 revealed an ejection fraction estimated to be 50 to 55%. Continue monitoring and evaluation per cardiology. 3. Diabetes: Patient's blood glucose remains chronically elevated.  Patient has improved blood glucose control.  Continue insulin pump as directed. 4. Thrombocytopenia: Intermittent.  Patient had a normal platelet count in July 2023, today's result was 113.  Monitor. 5.  Bradycardia: Patient has pacemaker in place. 6.  Renal insufficiency: Chronic and unchanged.  Patient's creatinine is 1.72 today.  Continue follow-up with nephrology as scheduled.  Patient expressed understanding and was in agreement with this plan. He also understands that He can call clinic at any time with any questions, concerns, or complaints.    Lloyd Huger, MD   08/10/2022 1:03 PM

## 2022-08-10 NOTE — Telephone Encounter (Signed)
Called and spoke with patient and informed him that Dr [name] had gotten scan results back and it showed disease progression.  Pt aware of PET scan scheduled for 08/24/22 and to see MD 08/26/22 at 1030a.  RN notified pt that Lucianne Lei transportation had also been scheduled and that scheduler was mailing him a new schedule of appointments.  Pt verbalized understanding.

## 2022-08-11 ENCOUNTER — Telehealth: Payer: Self-pay

## 2022-08-11 ENCOUNTER — Encounter (INDEPENDENT_AMBULATORY_CARE_PROVIDER_SITE_OTHER): Payer: Medicare Other

## 2022-08-11 ENCOUNTER — Other Ambulatory Visit: Payer: Self-pay

## 2022-08-11 DIAGNOSIS — E1059 Type 1 diabetes mellitus with other circulatory complications: Secondary | ICD-10-CM | POA: Diagnosis not present

## 2022-08-11 DIAGNOSIS — L97828 Non-pressure chronic ulcer of other part of left lower leg with other specified severity: Secondary | ICD-10-CM | POA: Diagnosis not present

## 2022-08-11 DIAGNOSIS — L97818 Non-pressure chronic ulcer of other part of right lower leg with other specified severity: Secondary | ICD-10-CM | POA: Diagnosis not present

## 2022-08-11 DIAGNOSIS — I872 Venous insufficiency (chronic) (peripheral): Secondary | ICD-10-CM | POA: Diagnosis not present

## 2022-08-11 DIAGNOSIS — Z4800 Encounter for change or removal of nonsurgical wound dressing: Secondary | ICD-10-CM | POA: Diagnosis not present

## 2022-08-11 DIAGNOSIS — I890 Lymphedema, not elsewhere classified: Secondary | ICD-10-CM | POA: Diagnosis not present

## 2022-08-11 MED ORDER — FUROSEMIDE 40 MG PO TABS: 40.0000 mg | ORAL_TABLET | Freq: Every day | ORAL | 1 refills | Status: DC

## 2022-08-11 NOTE — Telephone Encounter (Signed)
sent 

## 2022-08-11 NOTE — Telephone Encounter (Signed)
Izora Gala called from [facility] to state patient needs a refill for his furosemide (LASIX) 40 MG tablet.  Izora Gala states patient is out of this medication.  *Izora Gala states patient's preferred pharmacy is [facility] on [address].

## 2022-08-11 NOTE — Telephone Encounter (Signed)
Error

## 2022-08-11 NOTE — Telephone Encounter (Deleted)
-----   Message from Lloyd Huger, MD sent at 08/10/2022  1:01 PM EDT ----- Patient CT scan finally resulted and it looks like he has progressive disease.  I have ordered a PET scan and sent a schedule message for it to be done in the next 1 to 2 weeks with follow-up with MD 2 to 3 days later.  Please call the patient and make him aware.  I told him we would only call if the plan is changed.

## 2022-08-13 ENCOUNTER — Ambulatory Visit: Payer: Medicare Other | Admitting: Internal Medicine

## 2022-08-13 ENCOUNTER — Ambulatory Visit (INDEPENDENT_AMBULATORY_CARE_PROVIDER_SITE_OTHER): Payer: Medicare Other | Admitting: Internal Medicine

## 2022-08-13 ENCOUNTER — Other Ambulatory Visit: Payer: Self-pay

## 2022-08-13 ENCOUNTER — Encounter: Payer: Self-pay | Admitting: Internal Medicine

## 2022-08-13 ENCOUNTER — Telehealth: Payer: Self-pay

## 2022-08-13 VITALS — BP 126/60 | HR 76 | Temp 98.2°F | Resp 19 | Ht 70.0 in | Wt 263.2 lb

## 2022-08-13 DIAGNOSIS — Z23 Encounter for immunization: Secondary | ICD-10-CM | POA: Diagnosis not present

## 2022-08-13 DIAGNOSIS — N1832 Chronic kidney disease, stage 3b: Secondary | ICD-10-CM | POA: Diagnosis not present

## 2022-08-13 DIAGNOSIS — D696 Thrombocytopenia, unspecified: Secondary | ICD-10-CM

## 2022-08-13 DIAGNOSIS — D649 Anemia, unspecified: Secondary | ICD-10-CM

## 2022-08-13 DIAGNOSIS — I5022 Chronic systolic (congestive) heart failure: Secondary | ICD-10-CM

## 2022-08-13 DIAGNOSIS — R600 Localized edema: Secondary | ICD-10-CM | POA: Diagnosis not present

## 2022-08-13 DIAGNOSIS — I495 Sick sinus syndrome: Secondary | ICD-10-CM

## 2022-08-13 DIAGNOSIS — C8318 Mantle cell lymphoma, lymph nodes of multiple sites: Secondary | ICD-10-CM

## 2022-08-13 DIAGNOSIS — J439 Emphysema, unspecified: Secondary | ICD-10-CM

## 2022-08-13 DIAGNOSIS — I429 Cardiomyopathy, unspecified: Secondary | ICD-10-CM | POA: Diagnosis not present

## 2022-08-13 DIAGNOSIS — E1151 Type 2 diabetes mellitus with diabetic peripheral angiopathy without gangrene: Secondary | ICD-10-CM | POA: Diagnosis not present

## 2022-08-13 DIAGNOSIS — I700 Atherosclerosis of aorta: Secondary | ICD-10-CM | POA: Diagnosis not present

## 2022-08-13 DIAGNOSIS — E10649 Type 1 diabetes mellitus with hypoglycemia without coma: Secondary | ICD-10-CM | POA: Diagnosis not present

## 2022-08-13 DIAGNOSIS — I10 Essential (primary) hypertension: Secondary | ICD-10-CM | POA: Diagnosis not present

## 2022-08-13 DIAGNOSIS — E785 Hyperlipidemia, unspecified: Secondary | ICD-10-CM | POA: Diagnosis not present

## 2022-08-13 DIAGNOSIS — I152 Hypertension secondary to endocrine disorders: Secondary | ICD-10-CM | POA: Diagnosis not present

## 2022-08-13 DIAGNOSIS — I872 Venous insufficiency (chronic) (peripheral): Secondary | ICD-10-CM

## 2022-08-13 DIAGNOSIS — Z6841 Body Mass Index (BMI) 40.0 and over, adult: Secondary | ICD-10-CM | POA: Diagnosis not present

## 2022-08-13 DIAGNOSIS — I2510 Atherosclerotic heart disease of native coronary artery without angina pectoris: Secondary | ICD-10-CM

## 2022-08-13 DIAGNOSIS — E7800 Pure hypercholesterolemia, unspecified: Secondary | ICD-10-CM

## 2022-08-13 DIAGNOSIS — E1069 Type 1 diabetes mellitus with other specified complication: Secondary | ICD-10-CM | POA: Diagnosis not present

## 2022-08-13 MED ORDER — FUROSEMIDE 40 MG PO TABS: 40.0000 mg | ORAL_TABLET | Freq: Every day | ORAL | 1 refills | Status: DC

## 2022-08-13 MED ORDER — FUROSEMIDE 40 MG PO TABS: ORAL_TABLET | ORAL | 6 refills | Status: DC

## 2022-08-13 NOTE — Telephone Encounter (Signed)
RX CORRECTED AND SENT TO [facility] - 2 TABS AM, 1 PM

## 2022-08-13 NOTE — Telephone Encounter (Signed)
Called [facility] pharmacy - provided them information for pt : Name DOB Address Insurance Credit Card Allergies  Pt set up in automatic home delivery system.  Rx sent to [facility] : Furosemide '40mg'$  1 QD #90 ref x 1

## 2022-08-13 NOTE — Progress Notes (Signed)
Patient ID: [name], male   DOB: Feb 18, 1946, 76 y.o.   MRN: [id number]   Subjective:    [name], male    DOB: 1946-01-13, 76 y.o.   MRN: [id number]    Patient here for  Chief Complaint  Patient presents with   Follow-up   Diabetes   Hypertension   .   HPI Sees Dr Honor Junes for diabetes.  Last A1c 7.3.  insulin pump.  Not having issues with increased low sugars like previous.  Is overall doing better with sugars.  Still getting meals on wheels.  No chest pain.  Breathing overall stable.  Discussed increased lower extremity swelling and weight.  Has not been taking his lasix.  Discussed need to be compliant with medication.  His sister gets his medication when she comes to visit.  Discussed delivery.  Occasional nausea.  Bowels ok.  Eating.  Sleeping better.     Past Medical History:  Diagnosis Date   CHF (congestive heart failure) ([place])    Depression    Diabetes mellitus due to underlying condition with diabetic retinopathy with macular edema    Gastroparesis    Graves disease    Hypercholesterolemia    Hypertension    Mantle cell lymphoma ([place]) 07/2014   Peripheral neuropathy    Shingles    Past Surgical History:  Procedure Laterality Date   CHOLECYSTECTOMY     NEPHRECTOMY     right   PACEMAKER INSERTION N/A 12/07/2017   Procedure: INSERTION PACEMAKER DUAL CHAMBER INITIAL INSERT;  Surgeon: Isaias Cowman, MD;  Location: [facility];  Service: Cardiovascular;  Laterality: N/A;   PORTA CATH INSERTION N/A 11/03/2017   Procedure: PORTA CATH INSERTION;  Surgeon: Katha Cabal, MD;  Location: [facility];  Service: Cardiovascular;  Laterality: N/A;   Family History  Problem Relation Age of Onset   Breast cancer Mother    Diabetes Father    Cancer Father        Lymphoma   Alcohol abuse Maternal Uncle    Diabetes Sister    Heart disease Other        grandfather   Social History   Socioeconomic History   Marital status: Widowed     Spouse name: Not on file   Number of children: 1   Years of education: 71   Highest education level: 12th grade  Occupational History   Occupation: Retired  Tobacco Use   Smoking status: Former   Smokeless tobacco: Current    Types: Nurse, children's Use: Never used  Substance and Sexual Activity   Alcohol use: Yes    Alcohol/week: 3.0 standard drinks of alcohol    Types: 3 Cans of beer per week    Comment: nightly   Drug use: Never   Sexual activity: Not Currently  Other Topics Concern   Not on file  Social History Narrative   Not on file   Social Determinants of Health   Financial Resource Strain: Low Risk  (03/10/2022)   Overall Financial Resource Strain (CARDIA)    Difficulty of Paying Living Expenses: Not hard at all  Food Insecurity: No Food Insecurity (03/10/2022)   Hunger Vital Sign    Worried About Running Out of Food in the Last Year: Never true    Ran Out of Food in the Last Year: Never true  Transportation Needs: Unmet Transportation Needs (08/10/2022)   PRAPARE - Hydrologist (Medical):  Yes    Lack of Transportation (Non-Medical): Yes  Physical Activity: Inactive (03/10/2022)   Exercise Vital Sign    Days of Exercise per Week: 0 days    Minutes of Exercise per Session: 0 min  Stress: No Stress Concern Present (03/10/2022)   [place]    Feeling of Stress : Only a little  Social Connections: Socially Isolated (03/10/2022)   Social Connection and Isolation Panel [NHANES]    Frequency of Communication with Friends and Family: More than three times a week    Frequency of Social Gatherings with Friends and Family: Twice a week    Attends Religious Services: Never    Marine scientist or Organizations: No    Attends Archivist Meetings: Never    Marital Status: Widowed     Review of Systems  Constitutional:  Negative for appetite change.        Increased weight.   HENT:  Negative for congestion and sinus pressure.   Respiratory:  Negative for cough and chest tightness.        Breathing overall stable.   Cardiovascular:  Positive for leg swelling. Negative for chest pain and palpitations.  Gastrointestinal:  Negative for diarrhea and vomiting.       Occasional nausea.   Genitourinary:  Negative for difficulty urinating and dysuria.  Musculoskeletal:  Negative for joint swelling and myalgias.  Skin:  Negative for color change and rash.  Neurological:  Negative for dizziness and headaches.  Psychiatric/Behavioral:  Negative for agitation and dysphoric mood.        Objective:     BP 126/60 (BP Location: Left Arm, Patient Position: Sitting, Cuff Size: Large)   Pulse 76   Temp 98.2 F (36.8 C) (Temporal)   Resp 19   Ht _0  (1.778 m)   Wt 263 lb 3.2 oz (119.4 kg)   SpO2 97%   BMI 37.77 kg/m  Wt Readings from Last 3 Encounters:  08/13/22 263 lb 3.2 oz (119.4 kg)  08/10/22 252 lb (114.3 kg)  08/04/22 251 lb 6.4 oz (114 kg)    Physical Exam Vitals reviewed.  Constitutional:      General: He is not in acute distress.    Appearance: Normal appearance. He is well-developed.  HENT:     Head: Normocephalic and atraumatic.     Right Ear: External ear normal.     Left Ear: External ear normal.  Eyes:     General: No scleral icterus.       Right eye: No discharge.        Left eye: No discharge.     Conjunctiva/sclera: Conjunctivae normal.  Cardiovascular:     Rate and Rhythm: Normal rate and regular rhythm.  Pulmonary:     Effort: Pulmonary effort is normal. No respiratory distress.     Breath sounds: Normal breath sounds.  Abdominal:     General: Bowel sounds are normal.     Palpations: Abdomen is soft.     Tenderness: There is no abdominal tenderness.  Musculoskeletal:        General: No tenderness.     Cervical back: Neck supple. No tenderness.     Comments: Increased lower extremity swelling.     Lymphadenopathy:     Cervical: No cervical adenopathy.  Skin:    Findings: No erythema or rash.  Neurological:     Mental Status: He is alert.  Psychiatric:  Mood and Affect: Mood normal.        Behavior: Behavior normal.      Outpatient Encounter Medications as of 08/13/2022  Medication Sig   bacitracin 500 UNIT/GM ointment Apply 1 application topically daily.   Cholecalciferol (VITAMIN D3) 50 MCG (2000 UT) CAPS Take 2,000 Units by mouth daily.   doxycycline (VIBRAMYCIN) 100 MG capsule Take 1 capsule (100 mg total) by mouth 2 (two) times daily.   feeding supplement, GLUCERNA SHAKE, (GLUCERNA SHAKE) LIQD Take 237 mLs by mouth 2 (two) times daily between meals.   ferrous sulfate 325 (65 FE) MG tablet Take 325 mg by mouth daily with breakfast.   glucose 4 GM chewable tablet Chew 1 tablet (4 g total) by mouth once as needed for low blood sugar.   insulin aspart (NOVOLOG) 100 UNIT/ML injection Inject 6 Units into the skin 3 (three) times daily with meals. (Patient taking differently: Inject 80-100 Units into the skin once. INSULIN PUMP :Patient counts carb intake and calculates dose, with meals night time if CBG is less than 200 patient holds insulin. Patient taking 80-100 units daily.)   levothyroxine (SYNTHROID) 25 MCG tablet Take 1 tablet (25 mcg total) by mouth daily before breakfast.   nystatin cream (MYCOSTATIN) APPLY TO AFFECTED AREA TWICE A DAY   potassium chloride (KLOR-CON) 10 MEQ tablet Take 1 tablet (10 mEq total) by mouth 2 (two) times daily.   pravastatin (PRAVACHOL) 40 MG tablet Take 1 tablet (40 mg total) by mouth daily.   vitamin B-12 (CYANOCOBALAMIN) 1000 MCG tablet Take 1,000 mcg by mouth daily.   [DISCONTINUED] furosemide (LASIX) 40 MG tablet Take 1 tablet (40 mg total) by mouth daily.   Facility-Administered Encounter Medications as of 08/13/2022  Medication   heparin lock flush 100 unit/mL   sodium chloride flush (NS) 0.9 % injection 10 mL   sodium chloride  flush (NS) 0.9 % injection 10 mL     Lab Results  Component Value Date   WBC 5.9 08/10/2022   HGB 14.7 08/10/2022   HCT 44.2 08/10/2022   PLT 113 (L) 08/10/2022   GLUCOSE 202 (H) 08/10/2022   CHOL 123 05/11/2022   TRIG 52.0 05/11/2022   HDL 52.90 05/11/2022   LDLCALC 60 05/11/2022   ALT 10 08/10/2022   AST 21 08/10/2022   NA 135 08/10/2022   K 3.7 08/10/2022   CL 107 08/10/2022   CREATININE 1.72 (H) 08/10/2022   BUN 23 08/10/2022   CO2 22 08/10/2022   TSH 2.85 05/11/2022   PSA 1.98 05/11/2022   INR 1.2 08/29/2021   HGBA1C 7.4 04/30/2022   MICROALBUR <0.7 05/11/2022    CT CHEST ABDOMEN PELVIS W CONTRAST  Result Date: 08/10/2022 CLINICAL DATA:  Restaging mantle cell lymphoma. * Tracking Code: BO * EXAM: CT CHEST, ABDOMEN, AND PELVIS WITH CONTRAST TECHNIQUE: Multidetector CT imaging of the chest, abdomen and pelvis was performed following the standard protocol during bolus administration of intravenous contrast. RADIATION DOSE REDUCTION: This exam was performed according to the departmental dose-optimization program which includes automated exposure control, adjustment of the mA and/or kV according to patient size and/or use of iterative reconstruction technique. CONTRAST:  79m OMNIPAQUE IOHEXOL 300 MG/ML  SOLN COMPARISON:  08/29/2021 FINDINGS: CT CHEST FINDINGS Cardiovascular: Heart size appears within normal limits. No pericardial effusion. Aortic and coronary artery atherosclerotic calcifications. Left chest wall pacer device with leads in the right atrial appendage and right ventricle. Right chest wall port a catheter in place. Mediastinum/Nodes: No enlarged axillary, supraclavicular,  mediastinal or hilar lymph nodes. Thyroid gland, trachea, and esophagus are unremarkable. Lungs/Pleura: No pleural effusion, airspace consolidation, atelectasis or pneumothorax. Paraseptal and centrilobular emphysema. No suspicious pulmonary nodule or mass identified. Musculoskeletal: No chest wall  mass or suspicious bone lesions identified. CT ABDOMEN PELVIS FINDINGS Hepatobiliary: No focal liver abnormality is seen. Status post cholecystectomy. No biliary dilatation. Pancreas: Unremarkable. No pancreatic ductal dilatation or surrounding inflammatory changes. Spleen: Normal in size without focal abnormality. Adrenals/Urinary Tract: Normal adrenal glands. Right kidney absent. Unchanged exophytic cyst arising off the inferior pole of the left kidney measuring 1.1 cm, image 85/3. No follow-up imaging recommended. Urinary bladder unremarkable. Stomach/Bowel: Stomach appears normal. No bowel wall thickening, inflammation, or distension. Vascular/Lymphatic: Aortic atherosclerosis. Signs of abdominal, pelvic and inguinal adenopathy. New when compared with the previous exam. Index lymph nodes include: -left periaortic node measures 1.4 cm, image 84/3. Previously 0.6 cm. -left common iliac lymph node measures 1.7 cm, image 90/3. Previously 1.0 cm. -right common iliac node measures 1.5 cm, image 93/3. Formally 0.7 cm. -left external iliac nodal mass encasing the left external iliac artery and vein measures 5.3 x 3.3 cm, image 107/3. New from the previous exam. -right inguinal lymph node measures 2.4 cm, image 134/4. Previously 1.1 cm. -Left inguinal lymph node measures 1.8 cm, image 118/3. New from previous exam. Reproductive: Prostate is unremarkable. Other: No free fluid or fluid collections Musculoskeletal: No acute or significant osseous findings. IMPRESSION: 1. Interval development of abdominal, pelvic and inguinal adenopathy compatible with recurrent lymphoma. 2. No signs of adenopathy within the chest. 3. Coronary artery calcifications. 4. Aortic Atherosclerosis (ICD10-I70.0) and Emphysema (ICD10-J43.9). Electronically Signed   By: Kerby Moors M.D.   On: 08/10/2022 11:37       Assessment & Plan:   Problem List Items Addressed This Visit     Anemia    Follow cbc.       Aortic atherosclerosis (HCC)     Continue pravastatin.       CAD in native artery    Continue risk factor modification.  Followed by cardiology.       Cardiomyopathy, idiopathic (HCC)    Is off lasix.  Not had refilled.  Discussed with him regarding importance of taking medication regularly.  Relies on family to pick up rx.  Discussed delivery.  [facility] - [facility] will [facility].  Get him back on lasix regularly.  Weight is up.  Increased swelling.  Breathing overall stable.  Weight as outlined.  Discussed weighing daily and parameters given.  Low sodium diet.  Follow.       Chronic systolic CHF (congestive heart failure) (HCC)    Is off lasix.  Not had refilled.  Discussed with him regarding importance of taking medication regularly.  Relies on family to pick up rx.  Discussed delivery.  [facility] - [facility] will [facility].  Get him back on lasix regularly.  Weight is up.  Increased swelling.  Breathing overall stable.  Weight as outlined.  Discussed weighing daily and parameters given.  Low sodium diet.  Follow.       Chronic venous insufficiency    Followed by [facility].       CKD (chronic kidney disease) stage 3, GFR 30-59 ml/min (HCC)    Has been evaluated by Dr Candiss Norse.  Avoid antiinflammatories.  Follow metabolic panel.       Diabetes [facility])    Being followed by endocrinology.  Last a1c 7.2.  No significant problems with low sugars now.  Low carb diet.  Follow met b and a1c.       Edema of lower extremity    Is off lasix.  Not had refilled.  Discussed with him regarding importance of taking medication regularly.  Relies on family to pick up rx.  Discussed delivery.  [facility] - [facility] will [facility].  Get him back on lasix regularly.  Weight is up.  Increased swelling.  Breathing overall stable.  Weight as outlined.  Discussed weighing daily and parameters given.  Low sodium diet.  Follow. Continue f/u with [facility].       Emphysema lung ([place])    Breathing stable. Follow.       Essential  hypertension, benign    On coreg and lasix.  Blood pressure as outlined.  Follow pressures.  Follow metabolic panel.       Hypercholesterolemia    Continue pravastatin.  Low cholesterol diet and exercise.  Follow lipid panel and liver function tests.       Mantle cell lymphoma ([place])    Dr [name]:  His most recent CT scan on August 07, 2022 reviewed independently and report as above with clearly progressive disease with increasing lymphadenopathy.  recommend PET scan.       Sick sinus syndrome [facility])    S/p pacemaker placement.  Followed by cardiology.       Thrombocytopenia ([place])    Follow cbc.       Other Visit Diagnoses     Need for influenza vaccination    -  Primary   Relevant Orders   Flu Vaccine QUAD High Dose(Fluad) (Completed)        [name], MD

## 2022-08-14 ENCOUNTER — Other Ambulatory Visit: Payer: Self-pay

## 2022-08-14 MED ORDER — FUROSEMIDE 40 MG PO TABS: ORAL_TABLET | ORAL | 6 refills | Status: DC

## 2022-08-14 NOTE — Telephone Encounter (Signed)
Resent

## 2022-08-14 NOTE — Telephone Encounter (Signed)
Judeen Hammans called from [facility] to state they did not receive prescription for patient.  Judeen Hammans states they did receive information to set the patient up, but they did not received prescription.

## 2022-08-18 DIAGNOSIS — E1059 Type 1 diabetes mellitus with other circulatory complications: Secondary | ICD-10-CM | POA: Diagnosis not present

## 2022-08-18 DIAGNOSIS — I872 Venous insufficiency (chronic) (peripheral): Secondary | ICD-10-CM | POA: Diagnosis not present

## 2022-08-18 DIAGNOSIS — I890 Lymphedema, not elsewhere classified: Secondary | ICD-10-CM | POA: Diagnosis not present

## 2022-08-18 DIAGNOSIS — L97818 Non-pressure chronic ulcer of other part of right lower leg with other specified severity: Secondary | ICD-10-CM | POA: Diagnosis not present

## 2022-08-18 DIAGNOSIS — Z4800 Encounter for change or removal of nonsurgical wound dressing: Secondary | ICD-10-CM | POA: Diagnosis not present

## 2022-08-18 DIAGNOSIS — L97828 Non-pressure chronic ulcer of other part of left lower leg with other specified severity: Secondary | ICD-10-CM | POA: Diagnosis not present

## 2022-08-19 ENCOUNTER — Encounter: Payer: Self-pay | Admitting: Internal Medicine

## 2022-08-19 ENCOUNTER — Other Ambulatory Visit: Payer: Self-pay

## 2022-08-19 ENCOUNTER — Encounter (INDEPENDENT_AMBULATORY_CARE_PROVIDER_SITE_OTHER): Payer: Self-pay

## 2022-08-19 MED ORDER — FUROSEMIDE 40 MG PO TABS: ORAL_TABLET | ORAL | 6 refills | Status: DC

## 2022-08-19 NOTE — Telephone Encounter (Signed)
I called to let patient know that I had called [facility] after resending in the prescription for Lasix. They do have him set up for delivery & they should be getting medication out to him today.

## 2022-08-20 ENCOUNTER — Encounter (INDEPENDENT_AMBULATORY_CARE_PROVIDER_SITE_OTHER): Payer: Medicare Other

## 2022-08-23 ENCOUNTER — Encounter: Payer: Self-pay | Admitting: Internal Medicine

## 2022-08-23 NOTE — Assessment & Plan Note (Signed)
S/p pacemaker placement.  Followed by cardiology.  

## 2022-08-23 NOTE — Addendum Note (Signed)
Addended by: Alisa Graff on: 08/23/2022 12:02 PM   Modules accepted: Level of Service

## 2022-08-23 NOTE — Assessment & Plan Note (Signed)
Has been evaluated by Dr Candiss Norse.  Avoid antiinflammatories.  Follow metabolic panel.

## 2022-08-23 NOTE — Assessment & Plan Note (Signed)
Is off lasix.  Not had refilled.  Discussed with him regarding importance of taking medication regularly.  Relies on family to pick up rx.  Discussed delivery.  [facility] - [facility] will deliver.  Get him back on lasix regularly.  Weight is up.  Increased swelling.  Breathing overall stable.  Weight as outlined.  Discussed weighing daily and parameters given.  Low sodium diet.  Follow. Continue f/u with [facility].

## 2022-08-23 NOTE — Assessment & Plan Note (Signed)
Continue pravastatin.  Low cholesterol diet and exercise.  Follow lipid panel and liver function tests.   

## 2022-08-23 NOTE — Assessment & Plan Note (Signed)
Continue pravastatin 

## 2022-08-23 NOTE — Assessment & Plan Note (Signed)
Follow [facility].  

## 2022-08-23 NOTE — Assessment & Plan Note (Signed)
Is off lasix.  Not had refilled.  Discussed with him regarding importance of taking medication regularly.  Relies on family to pick up rx.  Discussed delivery.  [facility] - [facility] will deliver.  Get him back on lasix regularly.  Weight is up.  Increased swelling.  Breathing overall stable.  Weight as outlined.  Discussed weighing daily and parameters given.  Low sodium diet.  Follow.

## 2022-08-23 NOTE — Assessment & Plan Note (Signed)
Being followed by endocrinology.  Last a1c 7.2.  No significant problems with low sugars now.  Low carb diet.  Follow met b and a1c.

## 2022-08-23 NOTE — Assessment & Plan Note (Signed)
Dr [name]:  His most recent CT scan on August 07, 2022 reviewed independently and report as above with clearly progressive disease with increasing lymphadenopathy.  recommend PET scan.

## 2022-08-23 NOTE — Assessment & Plan Note (Signed)
Breathing stable.  Follow.    

## 2022-08-23 NOTE — Assessment & Plan Note (Signed)
Continue risk factor modification.  Followed by cardiology.

## 2022-08-23 NOTE — Assessment & Plan Note (Signed)
Followed by [facility].   

## 2022-08-23 NOTE — Assessment & Plan Note (Signed)
On coreg and lasix.  Blood pressure as outlined.  Follow pressures.  Follow metabolic panel.

## 2022-08-24 ENCOUNTER — Inpatient Hospital Stay: Payer: Medicare Other | Attending: Oncology

## 2022-08-24 ENCOUNTER — Ambulatory Visit: Payer: 59

## 2022-08-24 ENCOUNTER — Ambulatory Visit
Admission: RE | Admit: 2022-08-24 | Discharge: 2022-08-24 | Disposition: A | Discharge status: Home / Self Care | Payer: Medicare Other | Source: Ambulatory Visit | Attending: Oncology | Admitting: Oncology

## 2022-08-24 DIAGNOSIS — E1142 Type 2 diabetes mellitus with diabetic polyneuropathy: Secondary | ICD-10-CM | POA: Insufficient documentation

## 2022-08-24 DIAGNOSIS — R9389 Abnormal findings on diagnostic imaging of other specified body structures: Secondary | ICD-10-CM | POA: Diagnosis not present

## 2022-08-24 DIAGNOSIS — I110 Hypertensive heart disease with heart failure: Secondary | ICD-10-CM | POA: Insufficient documentation

## 2022-08-24 DIAGNOSIS — R001 Bradycardia, unspecified: Secondary | ICD-10-CM | POA: Insufficient documentation

## 2022-08-24 DIAGNOSIS — C8310 Mantle cell lymphoma, unspecified site: Secondary | ICD-10-CM | POA: Insufficient documentation

## 2022-08-24 DIAGNOSIS — N289 Disorder of kidney and ureter, unspecified: Secondary | ICD-10-CM | POA: Insufficient documentation

## 2022-08-24 DIAGNOSIS — Z905 Acquired absence of kidney: Secondary | ICD-10-CM | POA: Insufficient documentation

## 2022-08-24 DIAGNOSIS — I700 Atherosclerosis of aorta: Secondary | ICD-10-CM | POA: Insufficient documentation

## 2022-08-24 DIAGNOSIS — Z5112 Encounter for antineoplastic immunotherapy: Secondary | ICD-10-CM | POA: Insufficient documentation

## 2022-08-24 DIAGNOSIS — Z950 Presence of cardiac pacemaker: Secondary | ICD-10-CM | POA: Insufficient documentation

## 2022-08-24 DIAGNOSIS — Z9641 Presence of insulin pump (external) (internal): Secondary | ICD-10-CM | POA: Insufficient documentation

## 2022-08-24 DIAGNOSIS — R590 Localized enlarged lymph nodes: Secondary | ICD-10-CM | POA: Insufficient documentation

## 2022-08-24 DIAGNOSIS — J439 Emphysema, unspecified: Secondary | ICD-10-CM | POA: Insufficient documentation

## 2022-08-24 DIAGNOSIS — D696 Thrombocytopenia, unspecified: Secondary | ICD-10-CM | POA: Insufficient documentation

## 2022-08-24 DIAGNOSIS — Z807 Family history of other malignant neoplasms of lymphoid, hematopoietic and related tissues: Secondary | ICD-10-CM | POA: Insufficient documentation

## 2022-08-24 DIAGNOSIS — Z87891 Personal history of nicotine dependence: Secondary | ICD-10-CM | POA: Insufficient documentation

## 2022-08-24 DIAGNOSIS — Z5111 Encounter for antineoplastic chemotherapy: Secondary | ICD-10-CM | POA: Insufficient documentation

## 2022-08-24 DIAGNOSIS — I509 Heart failure, unspecified: Secondary | ICD-10-CM | POA: Insufficient documentation

## 2022-08-24 DIAGNOSIS — C8318 Mantle cell lymphoma, lymph nodes of multiple sites: Secondary | ICD-10-CM

## 2022-08-24 DIAGNOSIS — Z803 Family history of malignant neoplasm of breast: Secondary | ICD-10-CM | POA: Insufficient documentation

## 2022-08-24 LAB — GLUCOSE, CAPILLARY: Glucose-Capillary: 121 mg/dL — ABNORMAL HIGH (ref 70–99)

## 2022-08-24 MED ORDER — FLUDEOXYGLUCOSE F - 18 (FDG) INJECTION
12.9000 | Freq: Once | INTRAVENOUS | Status: AC | PRN
  Administered 2022-08-24: 13.36 via INTRAVENOUS

## 2022-08-25 ENCOUNTER — Encounter (INDEPENDENT_AMBULATORY_CARE_PROVIDER_SITE_OTHER): Payer: Medicare Other

## 2022-08-25 DIAGNOSIS — L97818 Non-pressure chronic ulcer of other part of right lower leg with other specified severity: Secondary | ICD-10-CM | POA: Diagnosis not present

## 2022-08-25 DIAGNOSIS — L97828 Non-pressure chronic ulcer of other part of left lower leg with other specified severity: Secondary | ICD-10-CM | POA: Diagnosis not present

## 2022-08-25 DIAGNOSIS — I890 Lymphedema, not elsewhere classified: Secondary | ICD-10-CM | POA: Diagnosis not present

## 2022-08-25 DIAGNOSIS — I872 Venous insufficiency (chronic) (peripheral): Secondary | ICD-10-CM | POA: Diagnosis not present

## 2022-08-25 DIAGNOSIS — E1059 Type 1 diabetes mellitus with other circulatory complications: Secondary | ICD-10-CM | POA: Diagnosis not present

## 2022-08-25 DIAGNOSIS — Z4800 Encounter for change or removal of nonsurgical wound dressing: Secondary | ICD-10-CM | POA: Diagnosis not present

## 2022-08-26 ENCOUNTER — Encounter: Payer: Self-pay | Admitting: Oncology

## 2022-08-26 ENCOUNTER — Inpatient Hospital Stay (HOSPITAL_BASED_OUTPATIENT_CLINIC_OR_DEPARTMENT_OTHER): Payer: Medicare Other | Admitting: Oncology

## 2022-08-26 ENCOUNTER — Inpatient Hospital Stay: Payer: Medicare Other

## 2022-08-26 VITALS — BP 144/76 | HR 80 | Temp 98.3°F | Resp 18 | Ht 70.0 in | Wt 252.0 lb

## 2022-08-26 DIAGNOSIS — Z950 Presence of cardiac pacemaker: Secondary | ICD-10-CM | POA: Diagnosis not present

## 2022-08-26 DIAGNOSIS — Z5111 Encounter for antineoplastic chemotherapy: Secondary | ICD-10-CM | POA: Diagnosis not present

## 2022-08-26 DIAGNOSIS — Z9641 Presence of insulin pump (external) (internal): Secondary | ICD-10-CM | POA: Diagnosis not present

## 2022-08-26 DIAGNOSIS — I110 Hypertensive heart disease with heart failure: Secondary | ICD-10-CM | POA: Diagnosis not present

## 2022-08-26 DIAGNOSIS — K5900 Constipation, unspecified: Secondary | ICD-10-CM | POA: Diagnosis not present

## 2022-08-26 DIAGNOSIS — Z95828 Presence of other vascular implants and grafts: Secondary | ICD-10-CM | POA: Diagnosis not present

## 2022-08-26 DIAGNOSIS — Z807 Family history of other malignant neoplasms of lymphoid, hematopoietic and related tissues: Secondary | ICD-10-CM | POA: Diagnosis not present

## 2022-08-26 DIAGNOSIS — I890 Lymphedema, not elsewhere classified: Secondary | ICD-10-CM | POA: Diagnosis not present

## 2022-08-26 DIAGNOSIS — E1051 Type 1 diabetes mellitus with diabetic peripheral angiopathy without gangrene: Secondary | ICD-10-CM | POA: Diagnosis not present

## 2022-08-26 DIAGNOSIS — I5022 Chronic systolic (congestive) heart failure: Secondary | ICD-10-CM | POA: Diagnosis not present

## 2022-08-26 DIAGNOSIS — C8318 Mantle cell lymphoma, lymph nodes of multiple sites: Secondary | ICD-10-CM

## 2022-08-26 DIAGNOSIS — N289 Disorder of kidney and ureter, unspecified: Secondary | ICD-10-CM | POA: Diagnosis not present

## 2022-08-26 DIAGNOSIS — Z87891 Personal history of nicotine dependence: Secondary | ICD-10-CM | POA: Diagnosis not present

## 2022-08-26 DIAGNOSIS — N1831 Chronic kidney disease, stage 3a: Secondary | ICD-10-CM | POA: Diagnosis not present

## 2022-08-26 DIAGNOSIS — Z4800 Encounter for change or removal of nonsurgical wound dressing: Secondary | ICD-10-CM | POA: Diagnosis not present

## 2022-08-26 DIAGNOSIS — Z905 Acquired absence of kidney: Secondary | ICD-10-CM | POA: Diagnosis not present

## 2022-08-26 DIAGNOSIS — Z803 Family history of malignant neoplasm of breast: Secondary | ICD-10-CM | POA: Diagnosis not present

## 2022-08-26 DIAGNOSIS — K3184 Gastroparesis: Secondary | ICD-10-CM | POA: Diagnosis not present

## 2022-08-26 DIAGNOSIS — E785 Hyperlipidemia, unspecified: Secondary | ICD-10-CM | POA: Diagnosis not present

## 2022-08-26 DIAGNOSIS — I872 Venous insufficiency (chronic) (peripheral): Secondary | ICD-10-CM | POA: Diagnosis not present

## 2022-08-26 DIAGNOSIS — I130 Hypertensive heart and chronic kidney disease with heart failure and stage 1 through stage 4 chronic kidney disease, or unspecified chronic kidney disease: Secondary | ICD-10-CM | POA: Diagnosis not present

## 2022-08-26 DIAGNOSIS — D696 Thrombocytopenia, unspecified: Secondary | ICD-10-CM | POA: Diagnosis not present

## 2022-08-26 DIAGNOSIS — E1059 Type 1 diabetes mellitus with other circulatory complications: Secondary | ICD-10-CM | POA: Diagnosis not present

## 2022-08-26 DIAGNOSIS — L97818 Non-pressure chronic ulcer of other part of right lower leg with other specified severity: Secondary | ICD-10-CM | POA: Diagnosis not present

## 2022-08-26 DIAGNOSIS — E039 Hypothyroidism, unspecified: Secondary | ICD-10-CM | POA: Diagnosis not present

## 2022-08-26 DIAGNOSIS — E1022 Type 1 diabetes mellitus with diabetic chronic kidney disease: Secondary | ICD-10-CM | POA: Diagnosis not present

## 2022-08-26 DIAGNOSIS — E103519 Type 1 diabetes mellitus with proliferative diabetic retinopathy with macular edema, unspecified eye: Secondary | ICD-10-CM | POA: Diagnosis not present

## 2022-08-26 DIAGNOSIS — Z794 Long term (current) use of insulin: Secondary | ICD-10-CM | POA: Diagnosis not present

## 2022-08-26 DIAGNOSIS — R001 Bradycardia, unspecified: Secondary | ICD-10-CM | POA: Diagnosis not present

## 2022-08-26 DIAGNOSIS — E1043 Type 1 diabetes mellitus with diabetic autonomic (poly)neuropathy: Secondary | ICD-10-CM | POA: Diagnosis not present

## 2022-08-26 DIAGNOSIS — E1142 Type 2 diabetes mellitus with diabetic polyneuropathy: Secondary | ICD-10-CM | POA: Diagnosis not present

## 2022-08-26 DIAGNOSIS — F32A Depression, unspecified: Secondary | ICD-10-CM | POA: Diagnosis not present

## 2022-08-26 DIAGNOSIS — I509 Heart failure, unspecified: Secondary | ICD-10-CM | POA: Diagnosis not present

## 2022-08-26 DIAGNOSIS — D509 Iron deficiency anemia, unspecified: Secondary | ICD-10-CM | POA: Diagnosis not present

## 2022-08-26 DIAGNOSIS — J439 Emphysema, unspecified: Secondary | ICD-10-CM | POA: Diagnosis not present

## 2022-08-26 DIAGNOSIS — I2510 Atherosclerotic heart disease of native coronary artery without angina pectoris: Secondary | ICD-10-CM | POA: Diagnosis not present

## 2022-08-26 DIAGNOSIS — Z5112 Encounter for antineoplastic immunotherapy: Secondary | ICD-10-CM | POA: Diagnosis not present

## 2022-08-26 DIAGNOSIS — H04123 Dry eye syndrome of bilateral lacrimal glands: Secondary | ICD-10-CM | POA: Diagnosis not present

## 2022-08-26 DIAGNOSIS — C8310 Mantle cell lymphoma, unspecified site: Secondary | ICD-10-CM | POA: Diagnosis not present

## 2022-08-26 DIAGNOSIS — L97828 Non-pressure chronic ulcer of other part of left lower leg with other specified severity: Secondary | ICD-10-CM | POA: Diagnosis not present

## 2022-08-26 MED ORDER — LIDOCAINE-PRILOCAINE 2.5-2.5 % EX CREA: TOPICAL_CREAM | CUTANEOUS | 3 refills | Status: DC

## 2022-08-26 MED ORDER — ONDANSETRON HCL 8 MG PO TABS: 8.0000 mg | ORAL_TABLET | Freq: Three times a day (TID) | ORAL | 2 refills | Status: DC | PRN

## 2022-08-26 MED ORDER — ACYCLOVIR 400 MG PO TABS: 400.0000 mg | ORAL_TABLET | Freq: Every day | ORAL | 3 refills | Status: DC

## 2022-08-26 MED ORDER — PROCHLORPERAZINE MALEATE 10 MG PO TABS: 10.0000 mg | ORAL_TABLET | Freq: Four times a day (QID) | ORAL | 2 refills | Status: DC | PRN

## 2022-08-26 NOTE — Progress Notes (Signed)
ON PATHWAY REGIMEN - Lymphoma and CLL  No Change  Continue With Treatment as Ordered.  Original Decision Date/Time: 10/24/2017 10:55     A cycle is every 28 days:     Bendamustine      Rituximab   **Always confirm dose/schedule in your pharmacy ordering system**  Patient Characteristics: Mantle Cell Lymphoma, First Line, Stage II - IV, Transplant Ineligible Disease Type: Not Applicable Disease Type: Mantle Cell Lymphoma Disease Type: Not Applicable Line of Therapy: First Line [place] Stage: III Eligible for Transplant<= Transplant Ineligible Intent of Therapy: Non-Curative / Palliative Intent, Discussed with Patient

## 2022-08-26 NOTE — Progress Notes (Signed)
[place]  [phone] [phone]  ID: Gary [name] OB: September 23, 1946  MR#: [id number]  HQI#:[id number]  Patient Care Team: [name], MD as PCP - General (Internal Medicine) Lloyd Huger, MD as Consulting Physician (Oncology) Lucky Cowboy Erskine Squibb, MD as Referring Physician (Vascular Surgery) Schnier, Dolores Lory, MD (Vascular Surgery) Corey Skains, MD as Consulting Physician (Cardiology)  CHIEF COMPLAINT: Recurrent Mantle cell lymphoma.  INTERVAL HISTORY: Patient returns to clinic today for further evaluation, discussion of his PET scan results, and treatment planning.  He continues to feel well and remains asymptomatic.  He does not complain of weakness or fatigue today.  He denies any fevers, chills, or night sweats.  He has no neurologic complaints.  He denies any chest pain, shortness of breath, cough, or hemoptysis.  He has no nausea, vomiting, constipation, or diarrhea.  He denies any melena or hematochezia.  He has no urinary complaints.  Patient offers no specific complaints today.  Review of Systems  Constitutional: Negative.  Negative for diaphoresis, fever, malaise/fatigue and weight loss.  Respiratory: Negative.  Negative for cough, hemoptysis and shortness of breath.   Cardiovascular: Negative.  Negative for chest pain and leg swelling.  Gastrointestinal: Negative.  Negative for abdominal pain.  Genitourinary: Negative.  Negative for dysuria.  Musculoskeletal: Negative.  Negative for back pain.  Skin: Negative.  Negative for rash.  Neurological: Negative.  Negative for dizziness, focal weakness, weakness and headaches.  Psychiatric/Behavioral: Negative.  The patient is not nervous/anxious.     As per HPI. Otherwise, a complete review of systems is negative.  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 retinopathy with macular edema    Gastroparesis    Graves disease    Hypercholesterolemia    Hypertension    Mantle cell lymphoma ([place]) 07/2014   Peripheral neuropathy    Shingles     PAST SURGICAL HISTORY: Past Surgical History:  Procedure Laterality Date   CHOLECYSTECTOMY     NEPHRECTOMY     right   PACEMAKER INSERTION N/A 12/07/2017   Procedure: INSERTION PACEMAKER DUAL CHAMBER INITIAL INSERT;  Surgeon: Isaias Cowman, MD;  Location: [facility] ORS;  Service: Cardiovascular;  Laterality: N/A;   PORTA CATH INSERTION N/A 11/03/2017   Procedure: PORTA CATH INSERTION;  Surgeon: Katha Cabal, MD;  Location: [facility];  Service: Cardiovascular;  Laterality: N/A;    FAMILY HISTORY Family History  Problem Relation Age of Onset   Breast cancer Mother    Diabetes Father    Cancer Father        Lymphoma   Alcohol abuse Maternal Uncle    Diabetes Sister    Heart disease Other        grandfather       ADVANCED DIRECTIVES:    HEALTH MAINTENANCE: Social History   Tobacco Use   Smoking status: Former   Smokeless tobacco: Current    Types: Chew  Vaping Use   Vaping Use: Never used  Substance Use Topics   Alcohol use: Yes    Alcohol/week: 3.0 standard drinks of alcohol    Types: 3 Cans of beer per week    Comment: nightly   Drug use: Never     Colonoscopy:  PAP:  Bone density:  Lipid panel:  Allergies  Allergen Reactions   Rofecoxib Nausea Only    Current  Outpatient Medications  Medication Sig Dispense Refill   bacitracin 500 UNIT/GM ointment Apply 1 application topically daily.     Cholecalciferol (VITAMIN D3) 50 MCG (2000 UT) CAPS Take 2,000 Units by mouth daily.     doxycycline (VIBRAMYCIN) 100 MG capsule Take 1 capsule (100 mg total) by mouth 2 (two) times daily. 20 capsule 0   feeding supplement, GLUCERNA SHAKE, (GLUCERNA SHAKE) LIQD Take 237 mLs by mouth 2 (two) times daily between meals. 237 mL 0   ferrous sulfate 325 (65 FE) MG  tablet Take 325 mg by mouth daily with breakfast.     furosemide (LASIX) 40 MG tablet Take 2 tablets in the morning, and 1 tablet in the evening. 90 tablet 6   glucose 4 GM chewable tablet Chew 1 tablet (4 g total) by mouth once as needed for low blood sugar. 50 tablet 0   insulin aspart (NOVOLOG) 100 UNIT/ML injection Inject 6 Units into the skin 3 (three) times daily with meals. (Patient taking differently: Inject 80-100 Units into the skin once. INSULIN PUMP :Patient counts carb intake and calculates dose, with meals night time if CBG is less than 200 patient holds insulin. Patient taking 80-100 units daily.)     levothyroxine (SYNTHROID) 25 MCG tablet Take 1 tablet (25 mcg total) by mouth daily before breakfast. 30 tablet 1   nystatin cream (MYCOSTATIN) APPLY TO AFFECTED AREA TWICE A DAY 30 g 1   potassium chloride (KLOR-CON) 10 MEQ tablet Take 1 tablet (10 mEq total) by mouth 2 (two) times daily. 60 tablet 1   pravastatin (PRAVACHOL) 40 MG tablet Take 1 tablet (40 mg total) by mouth daily. 30 tablet 3   vitamin B-12 (CYANOCOBALAMIN) 1000 MCG tablet Take 1,000 mcg by mouth daily.     acyclovir (ZOVIRAX) 400 MG tablet Take 1 tablet (400 mg total) by mouth daily. 30 tablet 3   lidocaine-prilocaine (EMLA) cream Apply to affected area once 30 g 3   ondansetron (ZOFRAN) 8 MG tablet Take 1 tablet (8 mg total) by mouth every 8 (eight) hours as needed for nausea or vomiting. Start on the third day after chemotherapy. 60 tablet 2   prochlorperazine (COMPAZINE) 10 MG tablet Take 1 tablet (10 mg total) by mouth every 6 (six) hours as needed for nausea or vomiting. 60 tablet 2   No current facility-administered medications for this visit.   Facility-Administered Medications Ordered in Other Visits  Medication Dose Route Frequency Provider Last Rate Last Admin   heparin lock flush 100 unit/mL  500 Units Intravenous Once Lloyd Huger, MD       sodium chloride flush (NS) 0.9 % injection 10 mL  10 mL  Intravenous PRN Lloyd Huger, MD   10 mL at 02/01/18 0829   sodium chloride flush (NS) 0.9 % injection 10 mL  10 mL Intravenous PRN Lloyd Huger, MD   10 mL at 04/05/18 0800    OBJECTIVE: Vitals:   08/26/22 1109  BP: (!) 144/76  Pulse: 80  Resp: 18  Temp: 98.3 F (36.8 C)  SpO2: 99%     Body mass index is 36.16 kg/m.    ECOG FS:0 - Asymptomatic  General: Well-developed, well-nourished, no acute distress. Eyes: Pink conjunctiva, anicteric sclera. HEENT: Normocephalic, moist mucous membranes. Lungs: No audible wheezing or coughing. Heart: Regular rate and rhythm. Abdomen: Soft, nontender, no obvious distention. Musculoskeletal: No edema, cyanosis, or clubbing. Neuro: Alert, answering all questions appropriately. Cranial nerves grossly intact. Skin: No rashes or petechiae  noted. Psych: Normal affect. Lymphatics: No palpable lymphadenopathy.  LAB RESULTS:  Lab Results  Component Value Date   NA 135 08/10/2022   K 3.7 08/10/2022   CL 107 08/10/2022   CO2 22 08/10/2022   GLUCOSE 202 (H) 08/10/2022   BUN 23 08/10/2022   CREATININE 1.72 (H) 08/10/2022   CALCIUM 7.7 (L) 08/10/2022   PROT 6.7 08/10/2022   ALBUMIN 3.5 08/10/2022   AST 21 08/10/2022   ALT 10 08/10/2022   ALKPHOS 82 08/10/2022   BILITOT 0.5 08/10/2022   GFRNONAA 41 (L) 08/10/2022   GFRAA 41 (L) 01/24/2020    Lab Results  Component Value Date   WBC 5.9 08/10/2022   NEUTROABS 4.2 08/10/2022   HGB 14.7 08/10/2022   HCT 44.2 08/10/2022   MCV 92.7 08/10/2022   PLT 113 (L) 08/10/2022   Lab Results  Component Value Date   IRON 13 (L) 08/11/2017   TIBC 223 (L) 01/12/2017   IRONPCTSAT 3.8 (L) 08/11/2017   Lab Results  Component Value Date   FERRITIN 42.0 08/11/2017     STUDIES:   ASSESSMENT: Recurrent low-grade mantle cell lymphoma. PLAN:    1. Mantle cell lymphoma, stage III: Patient's initial diagnosis was on inguinal lymph node biopsy on July 12, 2014.  Patient was noted to  have progressive disease and underwent chemotherapy using Rituxan and Treanda completing cycle 4 on Mar 09, 2018.  His most recent CT scan on August 07, 2022 reviewed independently with clearly progressive disease with increasing lymphadenopathy.  PET scan results from August 24, 2022 confirmed recurrent disease.  Since patient is over 4 years removed from his last treatment, can repeat chemotherapy using Treanda and Rituxan.  Plan to do 4-6 cycles every 4 weeks.  Return to clinic in 2 weeks for further evaluation and consideration of cycle 1.  Repeat PET scan after cycle 4.   2. Decreased ejection fraction: Improved.  Patient's most recent cardiac echo in November 2020 revealed an ejection fraction estimated to be 50 to 55%. Continue monitoring and evaluation per cardiology. 3. Diabetes: Patient's blood glucose remains chronically elevated.  Patient has improved blood glucose control.  Continue insulin pump as directed. 4. Thrombocytopenia: Chronic and unchanged.  Patient's most recent platelet count is 113. 5.  Bradycardia: Patient has pacemaker in place. 6.  Renal insufficiency: Chronic and unchanged.  Patient's most recent creatinine is 1.72.  Continue follow-up with nephrology as scheduled.  Patient expressed understanding and was in agreement with this plan. He also understands that He can call clinic at any time with any questions, concerns, or complaints.    Lloyd Huger, MD   08/26/2022 3:06 PM

## 2022-08-27 ENCOUNTER — Other Ambulatory Visit: Payer: Self-pay

## 2022-09-01 ENCOUNTER — Other Ambulatory Visit: Payer: Self-pay | Admitting: Internal Medicine

## 2022-09-01 ENCOUNTER — Encounter (INDEPENDENT_AMBULATORY_CARE_PROVIDER_SITE_OTHER): Payer: Medicare Other

## 2022-09-01 DIAGNOSIS — L97828 Non-pressure chronic ulcer of other part of left lower leg with other specified severity: Secondary | ICD-10-CM | POA: Diagnosis not present

## 2022-09-01 DIAGNOSIS — E1059 Type 1 diabetes mellitus with other circulatory complications: Secondary | ICD-10-CM | POA: Diagnosis not present

## 2022-09-01 DIAGNOSIS — I872 Venous insufficiency (chronic) (peripheral): Secondary | ICD-10-CM | POA: Diagnosis not present

## 2022-09-01 DIAGNOSIS — L97818 Non-pressure chronic ulcer of other part of right lower leg with other specified severity: Secondary | ICD-10-CM | POA: Diagnosis not present

## 2022-09-01 DIAGNOSIS — Z4800 Encounter for change or removal of nonsurgical wound dressing: Secondary | ICD-10-CM | POA: Diagnosis not present

## 2022-09-01 DIAGNOSIS — I890 Lymphedema, not elsewhere classified: Secondary | ICD-10-CM | POA: Diagnosis not present

## 2022-09-05 ENCOUNTER — Encounter: Payer: Self-pay | Admitting: Oncology

## 2022-09-08 ENCOUNTER — Ambulatory Visit (INDEPENDENT_AMBULATORY_CARE_PROVIDER_SITE_OTHER): Payer: Medicare Other | Admitting: Nurse Practitioner

## 2022-09-08 ENCOUNTER — Encounter (INDEPENDENT_AMBULATORY_CARE_PROVIDER_SITE_OTHER): Payer: Self-pay | Admitting: Nurse Practitioner

## 2022-09-08 VITALS — BP 117/73 | HR 90 | Resp 18 | Ht 65.0 in | Wt 249.0 lb

## 2022-09-08 DIAGNOSIS — L819 Disorder of pigmentation, unspecified: Secondary | ICD-10-CM

## 2022-09-08 DIAGNOSIS — I10 Essential (primary) hypertension: Secondary | ICD-10-CM

## 2022-09-08 DIAGNOSIS — I890 Lymphedema, not elsewhere classified: Secondary | ICD-10-CM | POA: Diagnosis not present

## 2022-09-09 ENCOUNTER — Encounter: Payer: Self-pay | Admitting: Oncology

## 2022-09-09 ENCOUNTER — Inpatient Hospital Stay: Payer: Medicare Other

## 2022-09-09 ENCOUNTER — Ambulatory Visit: Payer: 59 | Admitting: Oncology

## 2022-09-09 ENCOUNTER — Other Ambulatory Visit: Payer: 59

## 2022-09-09 ENCOUNTER — Inpatient Hospital Stay (HOSPITAL_BASED_OUTPATIENT_CLINIC_OR_DEPARTMENT_OTHER): Payer: Medicare Other | Admitting: Oncology

## 2022-09-09 DIAGNOSIS — C8318 Mantle cell lymphoma, lymph nodes of multiple sites: Secondary | ICD-10-CM | POA: Diagnosis not present

## 2022-09-09 DIAGNOSIS — Z5112 Encounter for antineoplastic immunotherapy: Secondary | ICD-10-CM | POA: Diagnosis not present

## 2022-09-09 DIAGNOSIS — N289 Disorder of kidney and ureter, unspecified: Secondary | ICD-10-CM | POA: Diagnosis not present

## 2022-09-09 DIAGNOSIS — E1142 Type 2 diabetes mellitus with diabetic polyneuropathy: Secondary | ICD-10-CM | POA: Diagnosis not present

## 2022-09-09 DIAGNOSIS — D696 Thrombocytopenia, unspecified: Secondary | ICD-10-CM | POA: Diagnosis not present

## 2022-09-09 DIAGNOSIS — C8310 Mantle cell lymphoma, unspecified site: Secondary | ICD-10-CM | POA: Diagnosis not present

## 2022-09-09 DIAGNOSIS — Z5111 Encounter for antineoplastic chemotherapy: Secondary | ICD-10-CM | POA: Diagnosis not present

## 2022-09-09 DIAGNOSIS — D509 Iron deficiency anemia, unspecified: Secondary | ICD-10-CM

## 2022-09-09 LAB — CBC WITH DIFFERENTIAL/PLATELET
Abs Immature Granulocytes: 0.05 10*3/uL (ref 0.00–0.07)
Basophils Absolute: 0.1 10*3/uL (ref 0.0–0.1)
Basophils Relative: 1 %
Eosinophils Absolute: 0.1 10*3/uL (ref 0.0–0.5)
Eosinophils Relative: 2 %
HCT: 45.2 % (ref 39.0–52.0)
Hemoglobin: 15 g/dL (ref 13.0–17.0)
Immature Granulocytes: 1 %
Lymphocytes Relative: 19 %
Lymphs Abs: 1.2 10*3/uL (ref 0.7–4.0)
MCH: 30.1 pg (ref 26.0–34.0)
MCHC: 33.2 g/dL (ref 30.0–36.0)
MCV: 90.6 fL (ref 80.0–100.0)
Monocytes Absolute: 0.5 10*3/uL (ref 0.1–1.0)
Monocytes Relative: 7 %
Neutro Abs: 4.4 10*3/uL (ref 1.7–7.7)
Neutrophils Relative %: 70 %
Platelets: 113 10*3/uL — ABNORMAL LOW (ref 150–400)
RBC: 4.99 MIL/uL (ref 4.22–5.81)
RDW: 13 % (ref 11.5–15.5)
WBC: 6.3 10*3/uL (ref 4.0–10.5)
nRBC: 0 % (ref 0.0–0.2)

## 2022-09-09 LAB — COMPREHENSIVE METABOLIC PANEL
ALT: 9 U/L (ref 0–44)
AST: 20 U/L (ref 15–41)
Albumin: 3.7 g/dL (ref 3.5–5.0)
Alkaline Phosphatase: 88 U/L (ref 38–126)
Anion gap: 11 (ref 5–15)
BUN: 33 mg/dL — ABNORMAL HIGH (ref 8–23)
CO2: 24 mmol/L (ref 22–32)
Calcium: 8.1 mg/dL — ABNORMAL LOW (ref 8.9–10.3)
Chloride: 102 mmol/L (ref 98–111)
Creatinine, Ser: 1.65 mg/dL — ABNORMAL HIGH (ref 0.61–1.24)
GFR, Estimated: 43 mL/min — ABNORMAL LOW (ref >60)
Glucose, Bld: 192 mg/dL — ABNORMAL HIGH (ref 70–99)
Potassium: 3.8 mmol/L (ref 3.5–5.1)
Sodium: 137 mmol/L (ref 135–145)
Total Bilirubin: 0.6 mg/dL (ref 0.3–1.2)
Total Protein: 7.2 g/dL (ref 6.5–8.1)

## 2022-09-09 LAB — HEPATITIS B SURFACE ANTIGEN: Hepatitis B Surface Ag: NONREACTIVE

## 2022-09-09 LAB — HEPATITIS B CORE ANTIBODY, TOTAL: Hep B Core Total Ab: NONREACTIVE

## 2022-09-09 MED ORDER — SODIUM CHLORIDE 0.9% FLUSH
10.0000 mL | INTRAVENOUS | Status: DC | PRN
  Administered 2022-09-09: 10 mL
  Filled 2022-09-09: qty 10

## 2022-09-09 MED ORDER — HEPARIN SOD (PORK) LOCK FLUSH 100 UNIT/ML IV SOLN
500.0000 [IU] | Freq: Once | INTRAVENOUS | Status: AC | PRN
  Administered 2022-09-09: 500 [IU]
  Filled 2022-09-09: qty 5

## 2022-09-09 MED FILL — Dexamethasone Sodium Phosphate Inj 100 MG/10ML: INTRAMUSCULAR | Qty: 1 | Status: AC

## 2022-09-09 NOTE — Progress Notes (Signed)
Pt feeling about the same. Appetite is up and down. Mild pain in hands. Mild intermittent pain.

## 2022-09-09 NOTE — Progress Notes (Signed)
[place]  [phone] [phone]) [phone]  ID: Gary [name] OB: 10/11/1946  MR#: [id number]  WFU#:[id number]  Patient Care Team: [name], MD as PCP - General (Internal Medicine) Lloyd Huger, MD as Consulting Physician (Oncology) Lucky Cowboy Erskine Squibb, MD as Referring Physician (Vascular Surgery) Schnier, Dolores Lory, MD (Vascular Surgery) Corey Skains, MD as Consulting Physician (Cardiology)  CHIEF COMPLAINT: Recurrent Mantle cell lymphoma.  INTERVAL HISTORY: Patient returns to clinic today for further evaluation and initiation of cycle 1 of Rituxan plus Treanda.  He continues to feel well and remains asymptomatic. He does not complain of weakness or fatigue today.  He denies any fevers, chills, or night sweats.  He has no neurologic complaints.  He denies any chest pain, shortness of breath, cough, or hemoptysis.  He has no nausea, vomiting, constipation, or diarrhea.  He denies any melena or hematochezia.  He has no urinary complaints.  Patient offers no specific complaints today.  Review of Systems  Constitutional: Negative.  Negative for diaphoresis, fever, malaise/fatigue and weight loss.  Respiratory: Negative.  Negative for cough, hemoptysis and shortness of breath.   Cardiovascular: Negative.  Negative for chest pain and leg swelling.  Gastrointestinal: Negative.  Negative for abdominal pain.  Genitourinary: Negative.  Negative for dysuria.  Musculoskeletal: Negative.  Negative for back pain.  Skin: Negative.  Negative for rash.  Neurological: Negative.  Negative for dizziness, focal weakness, weakness and headaches.  Psychiatric/Behavioral: Negative.  The patient is not nervous/anxious.     As per HPI. Otherwise, a complete review of systems is negative.  PAST MEDICAL HISTORY: Past Medical History:  Diagnosis Date   CHF (congestive heart failure) ([place])    Depression    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    Gastroparesis    Graves disease    Hypercholesterolemia    Hypertension    Mantle cell lymphoma ([place]) 07/2014   Peripheral neuropathy    Shingles     PAST SURGICAL HISTORY: Past Surgical History:  Procedure Laterality Date   CHOLECYSTECTOMY     NEPHRECTOMY     right   PACEMAKER INSERTION N/A 12/07/2017   Procedure: INSERTION PACEMAKER DUAL CHAMBER INITIAL INSERT;  Surgeon: Isaias Cowman, MD;  Location: [facility] ORS;  Service: Cardiovascular;  Laterality: N/A;   PORTA CATH INSERTION N/A 11/03/2017   Procedure: PORTA CATH INSERTION;  Surgeon: Katha Cabal, MD;  Location: [place] [facility];  Service: Cardiovascular;  Laterality: N/A;    FAMILY HISTORY Family History  Problem Relation Age of Onset   Breast cancer Mother    Diabetes Father    Cancer Father        Lymphoma   Alcohol abuse Maternal Uncle    Diabetes Sister    Heart disease Other        grandfather       ADVANCED DIRECTIVES:    HEALTH MAINTENANCE: Social History   Tobacco Use   Smoking status: Former   Smokeless tobacco: Current    Types: Chew  Vaping Use   Vaping Use: Never used  Substance Use Topics   Alcohol use: Yes    Alcohol/week: 3.0 standard drinks of alcohol    Types: 3 Cans of beer per week    Comment: nightly   Drug use: Never     Colonoscopy:  PAP:  Bone density:  Lipid panel:  Allergies  Allergen Reactions   Rofecoxib Nausea Only    Current Outpatient  Medications  Medication Sig Dispense Refill   bacitracin 500 UNIT/GM ointment Apply 1 application topically daily.     Cholecalciferol (VITAMIN D3) 50 MCG (2000 UT) CAPS Take 2,000 Units by mouth daily.     feeding supplement, GLUCERNA SHAKE, (GLUCERNA SHAKE) LIQD Take 237 mLs by mouth 2 (two) times daily between meals. 237 mL 0   ferrous sulfate 325 (65 FE) MG tablet Take 325 mg by mouth daily with breakfast.     furosemide (LASIX) 40 MG tablet Take 2 tablets in the morning, and 1  tablet in the evening. 90 tablet 6   insulin aspart (NOVOLOG) 100 UNIT/ML injection Inject 6 Units into the skin 3 (three) times daily with meals. (Patient taking differently: Inject 80-100 Units into the skin once. INSULIN PUMP :Patient counts carb intake and calculates dose, with meals night time if CBG is less than 200 patient holds insulin. Patient taking 80-100 units daily.)     levothyroxine (SYNTHROID) 25 MCG tablet Take 1 tablet (25 mcg total) by mouth daily before breakfast. 30 tablet 1   levothyroxine (SYNTHROID) 25 MCG tablet Take 25 mcg by mouth daily before breakfast.     lidocaine-prilocaine (EMLA) cream Apply to affected area once 30 g 3   nystatin cream (MYCOSTATIN) APPLY TO AFFECTED AREA TWICE A DAY 30 g 1   ondansetron (ZOFRAN) 8 MG tablet Take 1 tablet (8 mg total) by mouth every 8 (eight) hours as needed for nausea or vomiting. Start on the third day after chemotherapy. 60 tablet 2   potassium chloride (KLOR-CON) 10 MEQ tablet TAKE 1 TABLET BY MOUTH 2 TIMES DAILY. 90 tablet 1   pravastatin (PRAVACHOL) 40 MG tablet Take 1 tablet (40 mg total) by mouth daily. 30 tablet 3   prochlorperazine (COMPAZINE) 10 MG tablet Take 1 tablet (10 mg total) by mouth every 6 (six) hours as needed for nausea or vomiting. 60 tablet 2   vitamin B-12 (CYANOCOBALAMIN) 1000 MCG tablet Take 1,000 mcg by mouth daily.     acyclovir (ZOVIRAX) 400 MG tablet Take 1 tablet (400 mg total) by mouth daily. (Patient not taking: Reported on 09/09/2022) 30 tablet 3   glucose 4 GM chewable tablet Chew 1 tablet (4 g total) by mouth once as needed for low blood sugar. (Patient not taking: Reported on 09/09/2022) 50 tablet 0   No current facility-administered medications for this visit.   Facility-Administered Medications Ordered in Other Visits  Medication Dose Route Frequency Provider Last Rate Last Admin   heparin lock flush 100 unit/mL  500 Units Intravenous Once Lloyd Huger, MD       sodium chloride flush  (NS) 0.9 % injection 10 mL  10 mL Intravenous PRN Lloyd Huger, MD   10 mL at 02/01/18 0829   sodium chloride flush (NS) 0.9 % injection 10 mL  10 mL Intravenous PRN Lloyd Huger, MD   10 mL at 04/05/18 0800    OBJECTIVE: Vitals:   09/09/22 1028  BP: 97/66  Pulse: (!) 115  Resp: 19  Temp: 98.8 F (37.1 C)  SpO2: 98%     Body mass index is 41.5 kg/m.    ECOG FS:0 - Asymptomatic  General: Well-developed, well-nourished, no acute distress. Eyes: Pink conjunctiva, anicteric sclera. HEENT: Normocephalic, moist mucous membranes. Lungs: No audible wheezing or coughing. Heart: Regular rate and rhythm. Abdomen: Soft, nontender, no obvious distention. Musculoskeletal: No edema, cyanosis, or clubbing. Neuro: Alert, answering all questions appropriately. Cranial nerves grossly intact. Skin: No rashes  or petechiae noted. Psych: Normal affect.  LAB RESULTS:  Lab Results  Component Value Date   NA 137 09/09/2022   K 3.8 09/09/2022   CL 102 09/09/2022   CO2 24 09/09/2022   GLUCOSE 192 (H) 09/09/2022   BUN 33 (H) 09/09/2022   CREATININE 1.65 (H) 09/09/2022   CALCIUM 8.1 (L) 09/09/2022   PROT 7.2 09/09/2022   ALBUMIN 3.7 09/09/2022   AST 20 09/09/2022   ALT 9 09/09/2022   ALKPHOS 88 09/09/2022   BILITOT 0.6 09/09/2022   GFRNONAA 43 (L) 09/09/2022   GFRAA 41 (L) 01/24/2020    Lab Results  Component Value Date   WBC 6.3 09/09/2022   NEUTROABS 4.4 09/09/2022   HGB 15.0 09/09/2022   HCT 45.2 09/09/2022   MCV 90.6 09/09/2022   PLT 113 (L) 09/09/2022   Lab Results  Component Value Date   IRON 13 (L) 08/11/2017   TIBC 223 (L) 01/12/2017   IRONPCTSAT 3.8 (L) 08/11/2017   Lab Results  Component Value Date   FERRITIN 42.0 08/11/2017     STUDIES:   ASSESSMENT: Recurrent low-grade mantle cell lymphoma. PLAN:    1. Mantle cell lymphoma, stage III: Patient's initial diagnosis was on inguinal lymph node biopsy on July 12, 2014.  Patient was noted to have  progressive disease and underwent chemotherapy using Rituxan and Treanda completing cycle 4 on Mar 09, 2018.  His most recent CT scan on August 07, 2022 reviewed independently with clearly progressive disease with increasing lymphadenopathy.  PET scan results from August 24, 2022 confirmed recurrent disease.  Since patient is over 4 years removed from his last treatment, can repeat chemotherapy using Treanda and Rituxan.  Plan to do 4-6 cycles every 4 weeks.  Return to clinic tomorrow for cycle 1, day 1 of treatment.  Return to clinic Friday for day 2.  Patient will then return to clinic in 2 weeks for laboratory work and further evaluation and then in 4 weeks for further evaluation and consideration of cycle 2.  Plan to repeat PET scan after cycle 4.   2. Decreased ejection fraction: Improved.  Patient's most recent cardiac echo in November 2020 revealed an ejection fraction estimated to be 50 to 55%. Continue monitoring and evaluation per cardiology. 3. Diabetes: Patient's blood glucose remains chronically elevated.  Patient has improved blood glucose control.  Continue insulin pump as directed. 4. Thrombocytopenia: Chronic and unchanged.  Patient's platelet count is 113.  5.  Bradycardia: Patient has pacemaker in place. 6.  Renal insufficiency: Chronic and unchanged.  Patient's creatinine is 1.65.  Continue follow-up with nephrology as scheduled.  Patient expressed understanding and was in agreement with this plan. He also understands that He can call clinic at any time with any questions, concerns, or complaints.    Lloyd Huger, MD   09/09/2022 3:06 PM

## 2022-09-09 NOTE — Patient Instructions (Signed)

## 2022-09-10 ENCOUNTER — Inpatient Hospital Stay: Payer: Medicare Other

## 2022-09-10 ENCOUNTER — Other Ambulatory Visit: Payer: Self-pay

## 2022-09-10 VITALS — BP 94/55 | HR 81 | Temp 96.3°F | Resp 18

## 2022-09-10 DIAGNOSIS — Z5111 Encounter for antineoplastic chemotherapy: Secondary | ICD-10-CM | POA: Diagnosis not present

## 2022-09-10 DIAGNOSIS — C8310 Mantle cell lymphoma, unspecified site: Secondary | ICD-10-CM | POA: Diagnosis not present

## 2022-09-10 DIAGNOSIS — N289 Disorder of kidney and ureter, unspecified: Secondary | ICD-10-CM | POA: Diagnosis not present

## 2022-09-10 DIAGNOSIS — C8318 Mantle cell lymphoma, lymph nodes of multiple sites: Secondary | ICD-10-CM

## 2022-09-10 DIAGNOSIS — D696 Thrombocytopenia, unspecified: Secondary | ICD-10-CM | POA: Diagnosis not present

## 2022-09-10 DIAGNOSIS — Z5112 Encounter for antineoplastic immunotherapy: Secondary | ICD-10-CM | POA: Diagnosis not present

## 2022-09-10 DIAGNOSIS — E1142 Type 2 diabetes mellitus with diabetic polyneuropathy: Secondary | ICD-10-CM | POA: Diagnosis not present

## 2022-09-10 MED ORDER — SODIUM CHLORIDE 0.9 % IV SOLN
10.0000 mg | Freq: Once | INTRAVENOUS | Status: AC
  Administered 2022-09-10: 10 mg via INTRAVENOUS
  Filled 2022-09-10: qty 10

## 2022-09-10 MED ORDER — SODIUM CHLORIDE 0.9 % IV SOLN
Freq: Once | INTRAVENOUS | Status: AC
  Filled 2022-09-10: qty 250

## 2022-09-10 MED ORDER — SODIUM CHLORIDE 0.9 % IV SOLN
375.0000 mg/m2 | Freq: Once | INTRAVENOUS | Status: AC
  Administered 2022-09-10: 900 mg via INTRAVENOUS
  Filled 2022-09-10: qty 50

## 2022-09-10 MED ORDER — ACETAMINOPHEN 325 MG PO TABS
650.0000 mg | ORAL_TABLET | Freq: Once | ORAL | Status: AC
  Administered 2022-09-10: 650 mg via ORAL
  Filled 2022-09-10: qty 2

## 2022-09-10 MED ORDER — HEPARIN SOD (PORK) LOCK FLUSH 100 UNIT/ML IV SOLN
500.0000 [IU] | Freq: Once | INTRAVENOUS | Status: DC | PRN
  Filled 2022-09-10: qty 5

## 2022-09-10 MED ORDER — PALONOSETRON HCL INJECTION 0.25 MG/5ML
0.2500 mg | Freq: Once | INTRAVENOUS | Status: AC
  Administered 2022-09-10: 0.25 mg via INTRAVENOUS
  Filled 2022-09-10: qty 5

## 2022-09-10 MED ORDER — SODIUM CHLORIDE 0.9 % IV SOLN
200.0000 mg | Freq: Once | INTRAVENOUS | Status: AC
  Administered 2022-09-10: 200 mg via INTRAVENOUS
  Filled 2022-09-10: qty 8

## 2022-09-10 MED ORDER — DIPHENHYDRAMINE HCL 25 MG PO CAPS
25.0000 mg | ORAL_CAPSULE | Freq: Once | ORAL | Status: AC
  Administered 2022-09-10: 25 mg via ORAL
  Filled 2022-09-10: qty 1

## 2022-09-10 MED FILL — Dexamethasone Sodium Phosphate Inj 100 MG/10ML: INTRAMUSCULAR | Qty: 1 | Status: AC

## 2022-09-10 NOTE — Patient Instructions (Signed)
[facility] CANCER CTR AT [place]  Discharge Instructions: Thank you for choosing [facility] to provide your oncology and hematology care.  If you have a lab appointment with the [facility], please go directly to the [facility] and check in at the registration area.  Wear comfortable clothing and clothing appropriate for easy access to any Portacath or PICC line.   We strive to give you quality time with your provider. You may need to reschedule your appointment if you arrive late (15 or more minutes).  Arriving late affects you and other patients whose appointments are after yours.  Also, if you miss three or more appointments without notifying the office, you may be dismissed from the clinic at the provider's discretion.      For prescription refill requests, have your pharmacy contact our office and allow 72 hours for refills to be completed.    Today you received the following chemotherapy and/or immunotherapy agents Truxima, Bendeka      To help prevent nausea and vomiting after your treatment, we encourage you to take your nausea medication as directed.  BELOW ARE SYMPTOMS THAT SHOULD BE REPORTED IMMEDIATELY: *FEVER GREATER THAN 100.4 F (38 C) OR HIGHER *CHILLS OR SWEATING *NAUSEA AND VOMITING THAT IS NOT CONTROLLED WITH YOUR NAUSEA MEDICATION *UNUSUAL SHORTNESS OF BREATH *UNUSUAL BRUISING OR BLEEDING *URINARY PROBLEMS (pain or burning when urinating, or frequent urination) *BOWEL PROBLEMS (unusual diarrhea, constipation, pain near the anus) TENDERNESS IN MOUTH AND THROAT WITH OR WITHOUT PRESENCE OF ULCERS (sore throat, sores in mouth, or a toothache) UNUSUAL RASH, SWELLING OR PAIN  UNUSUAL VAGINAL DISCHARGE OR ITCHING   Items with * indicate a potential emergency and should be followed up as soon as possible or go to the Emergency Department if any problems should occur.  Please show the CHEMOTHERAPY ALERT CARD or IMMUNOTHERAPY ALERT CARD at  check-in to the Emergency Department and triage nurse.  Should you have questions after your visit or need to cancel or reschedule your appointment, please contact [facility] CANCER [place] AT [place]  [phone] and follow the prompts.  Office hours are 8:00 a.m. to 4:30 p.m. Monday - Friday. Please note that voicemails left after 4:00 p.m. may not be returned until the following business day.  We are closed weekends and major holidays. You have access to a nurse at all times for urgent questions. Please call the main number to the clinic [phone] and follow the prompts.  For any non-urgent questions, you may also contact your provider using MyChart. We now offer e-Visits for anyone 82 and older to request care online for non-urgent symptoms. For details visit [email or web address].   Also download the MyChart app! Go to the app store, search "MyChart", open the app, select [facility], and log in with your MyChart username and password.  Masks are optional in the cancer centers. If you would like for your care team to wear a mask while they are taking care of you, please let them know. For doctor visits, patients may have with them one support person who is at least 74 years old. At this time, visitors are not allowed in the infusion area.

## 2022-09-11 ENCOUNTER — Telehealth: Payer: Self-pay

## 2022-09-11 ENCOUNTER — Inpatient Hospital Stay: Payer: Medicare Other

## 2022-09-11 ENCOUNTER — Inpatient Hospital Stay: Payer: Medicare Other | Attending: Oncology

## 2022-09-11 VITALS — BP 109/55 | HR 88 | Temp 97.3°F | Resp 18

## 2022-09-11 DIAGNOSIS — E1142 Type 2 diabetes mellitus with diabetic polyneuropathy: Secondary | ICD-10-CM | POA: Insufficient documentation

## 2022-09-11 DIAGNOSIS — D696 Thrombocytopenia, unspecified: Secondary | ICD-10-CM | POA: Diagnosis not present

## 2022-09-11 DIAGNOSIS — Z87891 Personal history of nicotine dependence: Secondary | ICD-10-CM | POA: Diagnosis not present

## 2022-09-11 DIAGNOSIS — Z5111 Encounter for antineoplastic chemotherapy: Secondary | ICD-10-CM | POA: Diagnosis not present

## 2022-09-11 DIAGNOSIS — C8318 Mantle cell lymphoma, lymph nodes of multiple sites: Secondary | ICD-10-CM

## 2022-09-11 DIAGNOSIS — I509 Heart failure, unspecified: Secondary | ICD-10-CM | POA: Diagnosis not present

## 2022-09-11 DIAGNOSIS — E1165 Type 2 diabetes mellitus with hyperglycemia: Secondary | ICD-10-CM | POA: Insufficient documentation

## 2022-09-11 DIAGNOSIS — Z5112 Encounter for antineoplastic immunotherapy: Secondary | ICD-10-CM | POA: Diagnosis not present

## 2022-09-11 DIAGNOSIS — N289 Disorder of kidney and ureter, unspecified: Secondary | ICD-10-CM | POA: Insufficient documentation

## 2022-09-11 DIAGNOSIS — I110 Hypertensive heart disease with heart failure: Secondary | ICD-10-CM | POA: Diagnosis not present

## 2022-09-11 DIAGNOSIS — C8315 Mantle cell lymphoma, lymph nodes of inguinal region and lower limb: Secondary | ICD-10-CM | POA: Insufficient documentation

## 2022-09-11 MED ORDER — SODIUM CHLORIDE 0.9 % IV SOLN
200.0000 mg | Freq: Once | INTRAVENOUS | Status: AC
  Administered 2022-09-11: 200 mg via INTRAVENOUS
  Filled 2022-09-11: qty 8

## 2022-09-11 MED ORDER — HEPARIN SOD (PORK) LOCK FLUSH 100 UNIT/ML IV SOLN
500.0000 [IU] | Freq: Once | INTRAVENOUS | Status: AC | PRN
  Administered 2022-09-11: 500 [IU]
  Filled 2022-09-11: qty 5

## 2022-09-11 MED ORDER — SODIUM CHLORIDE 0.9% FLUSH
10.0000 mL | INTRAVENOUS | Status: DC | PRN
  Filled 2022-09-11: qty 10

## 2022-09-11 MED ORDER — SODIUM CHLORIDE 0.9 % IV SOLN
10.0000 mg | Freq: Once | INTRAVENOUS | Status: AC
  Administered 2022-09-11: 10 mg via INTRAVENOUS
  Filled 2022-09-11: qty 10

## 2022-09-11 MED ORDER — SODIUM CHLORIDE 0.9 % IV SOLN
Freq: Once | INTRAVENOUS | Status: AC
  Filled 2022-09-11: qty 250

## 2022-09-11 NOTE — Patient Outreach (Signed)
  Care Coordination   09/11/2022 Name: Gary [name] MRN: [id number] DOB: 19-Jul-1946   Care Coordination Outreach Attempts:  Successful outreach call made to patient.  Patient states he would like to hear more about the care coordination program but will not be able to talk at this time.   Follow Up Plan:  Additional outreach attempts will be made to offer the patient care coordination information and services.   Encounter Outcome:  Pt. Request to Call Back   Care Coordination Interventions:  No, not indicated    Quinn Plowman [facility] [place] [phone] direct line

## 2022-09-11 NOTE — Patient Instructions (Signed)
[facility] AT [place]  Discharge Instructions: Thank you for choosing [facility] to provide your oncology and hematology care.  If you have a lab appointment with the [facility], please go directly to the [facility] and check in at the registration area.  Wear comfortable clothing and clothing appropriate for easy access to any Portacath or PICC line.   We strive to give you quality time with your provider. You may need to reschedule your appointment if you arrive late (15 or more minutes).  Arriving late affects you and other patients whose appointments are after yours.  Also, if you miss three or more appointments without notifying the office, you may be dismissed from the clinic at the provider's discretion.      For prescription refill requests, have your pharmacy contact our office and allow 72 hours for refills to be completed.    Today you received the following chemotherapy and/or immunotherapy agents- bendamustine      To help prevent nausea and vomiting after your treatment, we encourage you to take your nausea medication as directed.  BELOW ARE SYMPTOMS THAT SHOULD BE REPORTED IMMEDIATELY: *FEVER GREATER THAN 100.4 F (38 C) OR HIGHER *CHILLS OR SWEATING *NAUSEA AND VOMITING THAT IS NOT CONTROLLED WITH YOUR NAUSEA MEDICATION *UNUSUAL SHORTNESS OF BREATH *UNUSUAL BRUISING OR BLEEDING *URINARY PROBLEMS (pain or burning when urinating, or frequent urination) *BOWEL PROBLEMS (unusual diarrhea, constipation, pain near the anus) TENDERNESS IN MOUTH AND THROAT WITH OR WITHOUT PRESENCE OF ULCERS (sore throat, sores in mouth, or a toothache) UNUSUAL RASH, SWELLING OR PAIN  UNUSUAL VAGINAL DISCHARGE OR ITCHING   Items with * indicate a potential emergency and should be followed up as soon as possible or go to the Emergency Department if any problems should occur.  Please show the CHEMOTHERAPY ALERT CARD or IMMUNOTHERAPY ALERT CARD at check-in  to the Emergency Department and triage nurse.  Should you have questions after your visit or need to cancel or reschedule your appointment, please contact [facility] [place] [facility] [place]  [phone] and follow the prompts.  Office hours are 8:00 a.m. to 4:30 p.m. Monday - Friday. Please note that voicemails left after 4:00 p.m. may not be returned until the following business day.  We are closed weekends and major holidays. You have access to a nurse at all times for urgent questions. Please call the main number to the clinic [phone] and follow the prompts.  For any non-urgent questions, you may also contact your provider using MyChart. We now offer e-Visits for anyone 58 and older to request care online for non-urgent symptoms. For details visit [email or web address].   Also download the MyChart app! Go to the app store, search "MyChart", open the app, select [facility], and log in with your MyChart username and password.  Masks are optional in the cancer centers. If you would like for your care team to wear a mask while they are taking care of you, please let them know. For doctor visits, patients may have with them one support person who is at least 74 years old. At this time, visitors are not allowed in the infusion area.

## 2022-09-13 ENCOUNTER — Other Ambulatory Visit: Payer: Self-pay | Admitting: Internal Medicine

## 2022-09-14 ENCOUNTER — Ambulatory Visit (INDEPENDENT_AMBULATORY_CARE_PROVIDER_SITE_OTHER): Payer: Medicare Other | Admitting: Podiatry

## 2022-09-14 ENCOUNTER — Encounter: Payer: Self-pay | Admitting: Podiatry

## 2022-09-14 ENCOUNTER — Encounter (INDEPENDENT_AMBULATORY_CARE_PROVIDER_SITE_OTHER): Payer: Self-pay | Admitting: Nurse Practitioner

## 2022-09-14 DIAGNOSIS — N179 Acute kidney failure, unspecified: Secondary | ICD-10-CM

## 2022-09-14 DIAGNOSIS — M79674 Pain in right toe(s): Secondary | ICD-10-CM | POA: Diagnosis not present

## 2022-09-14 DIAGNOSIS — B351 Tinea unguium: Secondary | ICD-10-CM | POA: Diagnosis not present

## 2022-09-14 DIAGNOSIS — M79675 Pain in left toe(s): Secondary | ICD-10-CM | POA: Diagnosis not present

## 2022-09-14 DIAGNOSIS — E1151 Type 2 diabetes mellitus with diabetic peripheral angiopathy without gangrene: Secondary | ICD-10-CM

## 2022-09-14 NOTE — Progress Notes (Signed)
Subjective:    [name], male    DOB: 1946/10/09, 76 y.o.   MRN: [id number] No chief complaint on file.   Gary [name] is a 76 year old male who presents today for follow-up evaluation of lower extremity edema following Unna wraps done by home health.  The patient is tolerating the wraps well.  There is some evidence that the patient's had some worsening swelling as this was likely related to him stopping his diuretics for short time.  He has recently been reinstated on his diuretics and the swelling is much improved.    Review of Systems  Cardiovascular:  Positive for leg swelling.  All other systems reviewed and are negative.      Objective:   Physical Exam Vitals reviewed.  HENT:     Head: Normocephalic.  Cardiovascular:     Rate and Rhythm: Normal rate.  Pulmonary:     Effort: Pulmonary effort is normal.  Musculoskeletal:     Right lower leg: Edema present.     Left lower leg: Edema present.  Skin:    General: Skin is warm and dry.  Neurological:     Mental Status: He is alert and oriented to person, place, and time.  Psychiatric:        Mood and Affect: Mood normal.        Behavior: Behavior normal.        Thought Content: Thought content normal.        Judgment: Judgment normal.     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    Gastroparesis    Graves disease    Hypercholesterolemia    Hypertension    Mantle cell lymphoma ([place]) 07/2014   Peripheral neuropathy    Shingles     Social History   Socioeconomic History   Marital status: Widowed    Spouse name: Not on file   Number of children: 1   Years of education: 84   Highest education level: 12th grade  Occupational  History   Occupation: Retired  Tobacco Use   Smoking status: Former   Smokeless tobacco: Current    Types: Nurse, children's Use: Never used  Substance and Sexual Activity   Alcohol use: Yes    Alcohol/week: 3.0 standard drinks of alcohol    Types: 3 Cans of beer per week    Comment: nightly   Drug use: Never   Sexual activity: Not Currently  Other Topics Concern   Not on file  Social History Narrative   Not on file   Social Determinants of Health   Financial Resource Strain: Low Risk  (03/10/2022)   Overall Financial Resource Strain (CARDIA)    Difficulty of Paying Living Expenses: Not hard at all  Food Insecurity: No Food Insecurity (03/10/2022)   Hunger Vital Sign    Worried About Running Out of Food in the Last Year: Never true    Ran Out of Food in the Last Year: Never true  Transportation Needs: Unmet Transportation Needs (08/10/2022)   PRAPARE - Hydrologist (Medical): Yes    Lack  of Transportation (Non-Medical): Yes  Physical Activity: Inactive (03/10/2022)   Exercise Vital Sign    Days of Exercise per Week: 0 days    Minutes of Exercise per Session: 0 min  Stress: No Stress Concern Present (03/10/2022)   [place]    Feeling of Stress : Only a little  Social Connections: Socially Isolated (03/10/2022)   Social Connection and Isolation Panel [NHANES]    Frequency of Communication with Friends and Family: More than three times a week    Frequency of Social Gatherings with Friends and Family: Twice a week    Attends Religious Services: Never    Marine scientist or Organizations: No    Attends Archivist Meetings: Never    Marital Status: Widowed  Intimate Partner Violence: Not At Risk (03/10/2022)   Humiliation, Afraid, Rape, and Kick questionnaire    Fear of Current or Ex-Partner: No    Emotionally Abused: No    Physically Abused: No    Sexually  Abused: No    Past Surgical History:  Procedure Laterality Date   CHOLECYSTECTOMY     NEPHRECTOMY     right   PACEMAKER INSERTION N/A 12/07/2017   Procedure: INSERTION PACEMAKER DUAL CHAMBER INITIAL INSERT;  Surgeon: Isaias Cowman, MD;  Location: [facility] ORS;  Service: Cardiovascular;  Laterality: N/A;   PORTA CATH INSERTION N/A 11/03/2017   Procedure: PORTA CATH INSERTION;  Surgeon: Katha Cabal, MD;  Location: [facility];  Service: Cardiovascular;  Laterality: N/A;    Family History  Problem Relation Age of Onset   Breast cancer Mother    Diabetes Father    Cancer Father        Lymphoma   Alcohol abuse Maternal Uncle    Diabetes Sister    Heart disease Other        grandfather    Allergies  Allergen Reactions   Rofecoxib Nausea Only       Latest Ref Rng & Units 09/09/2022    9:43 AM 08/10/2022   10:05 AM 05/11/2022   10:15 AM  CBC  WBC 4.0 - 10.5 K/uL 6.3  5.9  6.0   Hemoglobin 13.0 - 17.0 g/dL 15.0  14.7  14.8   Hematocrit 39.0 - 52.0 % 45.2  44.2  43.7   Platelets 150 - 400 K/uL 113  113  154.0       CMP     Component Value Date/Time   NA 137 09/09/2022 0943   NA 138 07/15/2014 0515   K 3.8 09/09/2022 0943   K 3.5 01/16/2015 1429   CL 102 09/09/2022 0943   CL 110 (H) 07/15/2014 0515   CO2 24 09/09/2022 0943   CO2 24 07/15/2014 0515   GLUCOSE 192 (H) 09/09/2022 0943   GLUCOSE 145 (H) 07/15/2014 0515   BUN 33 (H) 09/09/2022 0943   BUN 17 07/15/2014 0515   CREATININE 1.65 (H) 09/09/2022 0943   CREATININE 1.38 (H) 10/17/2021 1624   CALCIUM 8.1 (L) 09/09/2022 0943   CALCIUM 7.1 (L) 07/15/2014 0515   PROT 7.2 09/09/2022 0943   PROT 6.2 (L) 10/22/2014 1000   ALBUMIN 3.7 09/09/2022 0943   ALBUMIN 2.6 (L) 10/22/2014 1000   AST 20 09/09/2022 0943   AST 19 10/22/2014 1000   ALT 9 09/09/2022 0943   ALT 21 10/22/2014 1000   ALKPHOS 88 09/09/2022 0943   ALKPHOS 118 (H) 10/22/2014 1000   BILITOT 0.6 09/09/2022 4536  BILITOT 0.8  10/22/2014 1000   GFRNONAA 43 (L) 09/09/2022 0943   GFRNONAA >60 01/16/2015 1429   GFRAA 41 (L) 01/24/2020 0925   GFRAA >60 01/16/2015 1429     VAS [place] ABI WITH/WO TBI  Result Date: 07/28/2022  LOWER EXTREMITY DOPPLER STUDY Patient Name:  Gary [name]  Date of Exam:   07/22/2022 Medical Rec #: [id number]     Accession #:    [id number] Date of Birth: 01-13-1946     Patient Gender: M Patient Age:   89 years Exam Location:  [facility] Procedure:      VAS [place] ABI WITH/WO TBI Referring Phys: [name] --------------------------------------------------------------------------------  Indications: Ulceration, and edema. High Risk Factors: Hypertension, past history of smoking, coronary artery                    disease.  Performing Technologist: Delorise Shiner RVT  Examination Guidelines: A complete evaluation includes at minimum, Doppler waveform signals and systolic blood pressure reading at the level of bilateral brachial, anterior tibial, and posterior tibial arteries, when vessel segments are accessible. Bilateral testing is considered an integral part of a complete examination. Photoelectric Plethysmograph (PPG) waveforms and toe systolic pressure readings are included as required and additional duplex testing as needed. Limited examinations for reoccurring indications may be performed as noted.  ABI Findings: +---------+------------------+-----+---------+--------+ Right    Rt Pressure (mmHg)IndexWaveform Comment  +---------+------------------+-----+---------+--------+ Brachial 124                                      +---------+------------------+-----+---------+--------+ ATA      128               1.03 triphasic         +---------+------------------+-----+---------+--------+ PTA      144               1.16 triphasic         +---------+------------------+-----+---------+--------+ Great Toe104               0.84                    +---------+------------------+-----+---------+--------+ +---------+------------------+-----+---------+-------+ Left     Lt Pressure (mmHg)IndexWaveform Comment +---------+------------------+-----+---------+-------+ Brachial 119                                     +---------+------------------+-----+---------+-------+ ATA      139               1.12 triphasic        +---------+------------------+-----+---------+-------+ PTA      145               1.17 triphasic        +---------+------------------+-----+---------+-------+ Great Toe123               0.99                  +---------+------------------+-----+---------+-------+ +-------+-----------+-----------+------------+------------+ ABI/TBIToday's ABIToday's TBIPrevious ABIPrevious TBI +-------+-----------+-----------+------------+------------+ Right  1.16       0.84       1.22        1.14         +-------+-----------+-----------+------------+------------+ Left   1.17       0.99       1.40        1.19         +-------+-----------+-----------+------------+------------+  Bilateral ABIs appear essentially unchanged compared to prior study on 10/11/2019.  Summary: Right: Resting right ankle-brachial index is within normal range. The right toe-brachial index is normal. Left: Resting left ankle-brachial index is within normal range. The left toe-brachial index is normal. *See table(s) above for measurements and observations.  Electronically signed by Leotis Pain MD on 07/28/2022 at 3:56:45 PM.    Final        Assessment & Plan:   1. Lymphedema The patient is improved with lymphedema.  He is tolerating the Unna boots well and the wrapping is better than previous.  He will continue to have them done weekly by home health.  We will reevaluate progress in 4 weeks.  2. Discoloration of skin of toe I believe this to be a blood blister as the patient has had many times before.  These are usually caused by irritation from his  shoe and not utilizing socks.  We will cover this area and try to continue to keep it covered as it helps with healing.  3. Primary hypertension Continue antihypertensive medications as already ordered, these medications have been reviewed and there are no changes at this time.    Current Outpatient Medications on File Prior to Visit  Medication Sig Dispense Refill   acyclovir (ZOVIRAX) 400 MG tablet Take 1 tablet (400 mg total) by mouth daily. (Patient not taking: Reported on 09/09/2022) 30 tablet 3   bacitracin 500 UNIT/GM ointment Apply 1 application topically daily.     Cholecalciferol (VITAMIN D3) 50 MCG (2000 UT) CAPS Take 2,000 Units by mouth daily.     feeding supplement, GLUCERNA SHAKE, (GLUCERNA SHAKE) LIQD Take 237 mLs by mouth 2 (two) times daily between meals. 237 mL 0   ferrous sulfate 325 (65 FE) MG tablet Take 325 mg by mouth daily with breakfast.     furosemide (LASIX) 40 MG tablet Take 2 tablets in the morning, and 1 tablet in the evening. 90 tablet 6   glucose 4 GM chewable tablet Chew 1 tablet (4 g total) by mouth once as needed for low blood sugar. (Patient not taking: Reported on 09/09/2022) 50 tablet 0   insulin aspart (NOVOLOG) 100 UNIT/ML injection Inject 6 Units into the skin 3 (three) times daily with meals. (Patient taking differently: Inject 80-100 Units into the skin once. INSULIN PUMP :Patient counts carb intake and calculates dose, with meals night time if CBG is less than 200 patient holds insulin. Patient taking 80-100 units daily.)     levothyroxine (SYNTHROID) 25 MCG tablet Take 1 tablet (25 mcg total) by mouth daily before breakfast. 30 tablet 1   lidocaine-prilocaine (EMLA) cream Apply to affected area once 30 g 3   nystatin cream (MYCOSTATIN) APPLY TO AFFECTED AREA TWICE A DAY 30 g 1   ondansetron (ZOFRAN) 8 MG tablet Take 1 tablet (8 mg total) by mouth every 8 (eight) hours as needed for nausea or vomiting. Start on the third day after chemotherapy. 60  tablet 2   potassium chloride (KLOR-CON) 10 MEQ tablet TAKE 1 TABLET BY MOUTH 2 TIMES DAILY. 90 tablet 1   pravastatin (PRAVACHOL) 40 MG tablet Take 1 tablet (40 mg total) by mouth daily. 30 tablet 3   prochlorperazine (COMPAZINE) 10 MG tablet Take 1 tablet (10 mg total) by mouth every 6 (six) hours as needed for nausea or vomiting. 60 tablet 2   vitamin B-12 (CYANOCOBALAMIN) 1000 MCG tablet Take 1,000 mcg by mouth daily.     Current Facility-Administered Medications on  File Prior to Visit  Medication Dose Route Frequency Provider Last Rate Last Admin   heparin lock flush 100 unit/mL  500 Units Intravenous Once Lloyd Huger, MD       sodium chloride flush (NS) 0.9 % injection 10 mL  10 mL Intravenous PRN Lloyd Huger, MD   10 mL at 02/01/18 0829   sodium chloride flush (NS) 0.9 % injection 10 mL  10 mL Intravenous PRN Lloyd Huger, MD   10 mL at 04/05/18 0800    There are no Patient Instructions on file for this visit. No follow-ups on file.   Kris Hartmann, NP

## 2022-09-14 NOTE — Progress Notes (Signed)
This patient returns to my office for at risk foot care.  This patient requires this care by a professional since this patient will be at risk due to having chronic kidney disease and diabetes.  This patient is unable to cut nails himself since the patient cannot reach his nails.These nails are painful walking and wearing shoes. This patient is wearing unna boots on both legs/feet. This patient presents for at risk foot care today.  General Appearance  Alert, conversant and in no acute stress.  Vascular  Deferred due to unna boots.  Neurologic  Deferred due to unna boots.  Nails Thick disfigured discolored nails with subungual debris  from hallux to fifth toes bilaterally. No evidence of bacterial infection or drainage bilaterally.   Orthopedic  No limitations of motion  feet .  No crepitus or effusions noted.  No bony pathology or digital deformities noted.  Skin  normotropic skin with no porokeratosis noted bilaterally.  No signs of infections or ulcers noted.     Onychomycosis  Pain in right toes  Pain in left toes  Consent was obtained for treatment procedures.   Mechanical debridement of nails 1-5  bilaterally performed with a nail nipper.  Filed with dremel without incident. Cauterized digits left foot.   Return office visit    3 months                 Told patient to return for periodic foot care and evaluation due to potential at risk complications.   Paiden Caraveo DPM  

## 2022-09-15 DIAGNOSIS — I872 Venous insufficiency (chronic) (peripheral): Secondary | ICD-10-CM | POA: Diagnosis not present

## 2022-09-15 DIAGNOSIS — Z4800 Encounter for change or removal of nonsurgical wound dressing: Secondary | ICD-10-CM | POA: Diagnosis not present

## 2022-09-15 DIAGNOSIS — L97818 Non-pressure chronic ulcer of other part of right lower leg with other specified severity: Secondary | ICD-10-CM | POA: Diagnosis not present

## 2022-09-15 DIAGNOSIS — L97828 Non-pressure chronic ulcer of other part of left lower leg with other specified severity: Secondary | ICD-10-CM | POA: Diagnosis not present

## 2022-09-15 DIAGNOSIS — E1059 Type 1 diabetes mellitus with other circulatory complications: Secondary | ICD-10-CM | POA: Diagnosis not present

## 2022-09-15 DIAGNOSIS — I890 Lymphedema, not elsewhere classified: Secondary | ICD-10-CM | POA: Diagnosis not present

## 2022-09-22 ENCOUNTER — Encounter (INDEPENDENT_AMBULATORY_CARE_PROVIDER_SITE_OTHER): Payer: Self-pay

## 2022-09-22 DIAGNOSIS — L97818 Non-pressure chronic ulcer of other part of right lower leg with other specified severity: Secondary | ICD-10-CM | POA: Diagnosis not present

## 2022-09-22 DIAGNOSIS — Z4800 Encounter for change or removal of nonsurgical wound dressing: Secondary | ICD-10-CM | POA: Diagnosis not present

## 2022-09-22 DIAGNOSIS — E1059 Type 1 diabetes mellitus with other circulatory complications: Secondary | ICD-10-CM | POA: Diagnosis not present

## 2022-09-22 DIAGNOSIS — L97828 Non-pressure chronic ulcer of other part of left lower leg with other specified severity: Secondary | ICD-10-CM | POA: Diagnosis not present

## 2022-09-22 DIAGNOSIS — I890 Lymphedema, not elsewhere classified: Secondary | ICD-10-CM | POA: Diagnosis not present

## 2022-09-22 DIAGNOSIS — I872 Venous insufficiency (chronic) (peripheral): Secondary | ICD-10-CM | POA: Diagnosis not present

## 2022-09-22 NOTE — Telephone Encounter (Signed)
I spoke with Manuela Schwartz from [facility] and she informed that the patient will be discharge today due to having no open wounds. She will speak with the patient to see if he would be able to put compressions stockings to prevent any recurring concerns. If patient is not able to wear compression he will need to come to the office for unna wraps.Also home health will be able to recertify the patient if he starts having any open wounds. Manuela Schwartz will contact the office if she has any further concerns.  UCJAR:[phone]

## 2022-09-23 ENCOUNTER — Encounter: Payer: Self-pay | Admitting: Oncology

## 2022-09-23 ENCOUNTER — Inpatient Hospital Stay: Payer: Medicare Other

## 2022-09-23 ENCOUNTER — Inpatient Hospital Stay (HOSPITAL_BASED_OUTPATIENT_CLINIC_OR_DEPARTMENT_OTHER): Payer: Medicare Other | Admitting: Oncology

## 2022-09-23 VITALS — BP 91/76 | HR 83 | Temp 98.3°F | Resp 18 | Wt 250.4 lb

## 2022-09-23 DIAGNOSIS — Z803 Family history of malignant neoplasm of breast: Secondary | ICD-10-CM | POA: Diagnosis not present

## 2022-09-23 DIAGNOSIS — Z87891 Personal history of nicotine dependence: Secondary | ICD-10-CM

## 2022-09-23 DIAGNOSIS — Z807 Family history of other malignant neoplasms of lymphoid, hematopoietic and related tissues: Secondary | ICD-10-CM | POA: Diagnosis not present

## 2022-09-23 DIAGNOSIS — I509 Heart failure, unspecified: Secondary | ICD-10-CM | POA: Diagnosis not present

## 2022-09-23 DIAGNOSIS — I110 Hypertensive heart disease with heart failure: Secondary | ICD-10-CM | POA: Diagnosis not present

## 2022-09-23 DIAGNOSIS — C8315 Mantle cell lymphoma, lymph nodes of inguinal region and lower limb: Secondary | ICD-10-CM | POA: Diagnosis not present

## 2022-09-23 DIAGNOSIS — C8318 Mantle cell lymphoma, lymph nodes of multiple sites: Secondary | ICD-10-CM

## 2022-09-23 DIAGNOSIS — R110 Nausea: Secondary | ICD-10-CM | POA: Diagnosis not present

## 2022-09-23 DIAGNOSIS — N289 Disorder of kidney and ureter, unspecified: Secondary | ICD-10-CM

## 2022-09-23 DIAGNOSIS — E1165 Type 2 diabetes mellitus with hyperglycemia: Secondary | ICD-10-CM

## 2022-09-23 DIAGNOSIS — Z5112 Encounter for antineoplastic immunotherapy: Secondary | ICD-10-CM | POA: Diagnosis not present

## 2022-09-23 DIAGNOSIS — Z5111 Encounter for antineoplastic chemotherapy: Secondary | ICD-10-CM | POA: Diagnosis not present

## 2022-09-23 DIAGNOSIS — D696 Thrombocytopenia, unspecified: Secondary | ICD-10-CM

## 2022-09-23 DIAGNOSIS — E1142 Type 2 diabetes mellitus with diabetic polyneuropathy: Secondary | ICD-10-CM | POA: Diagnosis not present

## 2022-09-23 LAB — COMPREHENSIVE METABOLIC PANEL
ALT: 14 U/L (ref 0–44)
AST: 21 U/L (ref 15–41)
Albumin: 3.5 g/dL (ref 3.5–5.0)
Alkaline Phosphatase: 85 U/L (ref 38–126)
Anion gap: 9 (ref 5–15)
BUN: 32 mg/dL — ABNORMAL HIGH (ref 8–23)
CO2: 26 mmol/L (ref 22–32)
Calcium: 7.9 mg/dL — ABNORMAL LOW (ref 8.9–10.3)
Chloride: 103 mmol/L (ref 98–111)
Creatinine, Ser: 1.39 mg/dL — ABNORMAL HIGH (ref 0.61–1.24)
GFR, Estimated: 53 mL/min — ABNORMAL LOW (ref >60)
Glucose, Bld: 203 mg/dL — ABNORMAL HIGH (ref 70–99)
Potassium: 4 mmol/L (ref 3.5–5.1)
Sodium: 138 mmol/L (ref 135–145)
Total Bilirubin: 0.7 mg/dL (ref 0.3–1.2)
Total Protein: 6.7 g/dL (ref 6.5–8.1)

## 2022-09-23 LAB — CBC WITH DIFFERENTIAL/PLATELET
Abs Immature Granulocytes: 0.04 10*3/uL (ref 0.00–0.07)
Basophils Absolute: 0 10*3/uL (ref 0.0–0.1)
Basophils Relative: 0 %
Eosinophils Absolute: 0.1 10*3/uL (ref 0.0–0.5)
Eosinophils Relative: 2 %
HCT: 43.1 % (ref 39.0–52.0)
Hemoglobin: 14.1 g/dL (ref 13.0–17.0)
Immature Granulocytes: 1 %
Lymphocytes Relative: 3 %
Lymphs Abs: 0.1 10*3/uL — ABNORMAL LOW (ref 0.7–4.0)
MCH: 29.8 pg (ref 26.0–34.0)
MCHC: 32.7 g/dL (ref 30.0–36.0)
MCV: 91.1 fL (ref 80.0–100.0)
Monocytes Absolute: 0.5 10*3/uL (ref 0.1–1.0)
Monocytes Relative: 11 %
Neutro Abs: 4 10*3/uL (ref 1.7–7.7)
Neutrophils Relative %: 83 %
Platelets: 110 10*3/uL — ABNORMAL LOW (ref 150–400)
RBC: 4.73 MIL/uL (ref 4.22–5.81)
RDW: 13.1 % (ref 11.5–15.5)
WBC: 4.8 10*3/uL (ref 4.0–10.5)
nRBC: 0 % (ref 0.0–0.2)

## 2022-09-23 NOTE — Progress Notes (Signed)
[place]  [phone] [phone]) [phone]  ID: Gary [name] OB: Oct 23, 1945  MR#: [id number]  HXT#:[id number]  Patient Care Team: [name], MD as PCP - General (Internal Medicine) Lloyd Huger, MD as Consulting Physician (Oncology) Lucky Cowboy Erskine Squibb, MD as Referring Physician (Vascular Surgery) Schnier, Dolores Lory, MD (Vascular Surgery) Corey Skains, MD as Consulting Physician (Cardiology)  I connected with Gary [name] on 09/23/22 at 10:45 AM EST by video enabled telemedicine visit and verified that I am speaking with the correct person using two identifiers.   I discussed the limitations, risks, security and privacy concerns of performing an evaluation and management service by telemedicine and the availability of in-person appointments. I also discussed with the patient that there may be a patient responsible charge related to this service. The patient expressed understanding and agreed to proceed.   Other persons participating in the visit and their role in the encounter: Patient, MD.  Patient's location: Clinic. Provider's location: Home.  CHIEF COMPLAINT: Recurrent Mantle cell lymphoma.  INTERVAL HISTORY: Patient returns to clinic today for further evaluation and to assess his toleration of cycle 1 of Rituxan plus Treanda.  He reported increased nausea for several days, but admitted not taking his antiemetics as prescribed.  He also had increased fatigue which is now resolved.  Currently he feels well.   He denies any fevers, chills, or night sweats.  He has no neurologic complaints.  He denies any chest pain, shortness of breath, cough, or hemoptysis.  He has no nausea, vomiting, constipation, or diarrhea.  He denies any melena or hematochezia.  He has no urinary complaints.  Patient offers no further specific complaints today.  Review of Systems  Constitutional: Negative.  Negative for diaphoresis, fever, malaise/fatigue and weight loss.   Respiratory: Negative.  Negative for cough, hemoptysis and shortness of breath.   Cardiovascular: Negative.  Negative for chest pain and leg swelling.  Gastrointestinal: Negative.  Negative for abdominal pain.  Genitourinary: Negative.  Negative for dysuria.  Musculoskeletal: Negative.  Negative for back pain.  Skin: Negative.  Negative for rash.  Neurological: Negative.  Negative for dizziness, focal weakness, weakness and headaches.  Psychiatric/Behavioral: Negative.  The patient is not nervous/anxious.     As per HPI. Otherwise, a complete review of systems is negative.  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 with macular edema    Gastroparesis    Graves disease    Hypercholesterolemia    Hypertension    Mantle cell lymphoma ([place]) 07/2014   Peripheral neuropathy    Shingles     PAST SURGICAL HISTORY: Past Surgical History:  Procedure Laterality Date   CHOLECYSTECTOMY     NEPHRECTOMY     right   PACEMAKER INSERTION N/A 12/07/2017   Procedure: INSERTION PACEMAKER DUAL CHAMBER INITIAL INSERT;  Surgeon: Isaias Cowman, MD;  Location: [facility];  Service: Cardiovascular;  Laterality: N/A;   PORTA CATH INSERTION N/A 11/03/2017   Procedure: PORTA CATH INSERTION;  Surgeon: Katha Cabal, MD;  Location: [facility];  Service: Cardiovascular;  Laterality: N/A;    FAMILY HISTORY Family History  Problem Relation Age of Onset   Breast cancer Mother    Diabetes Father    Cancer Father        Lymphoma   Alcohol abuse Maternal Uncle    Diabetes  Sister    Heart disease Other        grandfather       ADVANCED DIRECTIVES:    HEALTH MAINTENANCE: Social History   Tobacco Use   Smoking status: Former    Types: Cigarettes    Passive exposure: Past   Smokeless tobacco: Current    Types: Chew  Vaping Use    Vaping Use: Never used  Substance Use Topics   Alcohol use: Yes    Alcohol/week: 3.0 standard drinks of alcohol    Types: 3 Cans of beer per week    Comment: nightly   Drug use: Never     Colonoscopy:  PAP:  Bone density:  Lipid panel:  Allergies  Allergen Reactions   Rofecoxib Nausea Only    Current Outpatient Medications  Medication Sig Dispense Refill   acyclovir (ZOVIRAX) 400 MG tablet Take 1 tablet (400 mg total) by mouth daily. 30 tablet 3   bacitracin 500 UNIT/GM ointment Apply 1 application topically daily.     Cholecalciferol (VITAMIN D3) 50 MCG (2000 UT) CAPS Take 2,000 Units by mouth daily.     ferrous sulfate 325 (65 FE) MG tablet Take 325 mg by mouth daily with breakfast.     furosemide (LASIX) 40 MG tablet Take 2 tablets in the morning, and 1 tablet in the evening. 90 tablet 6   Insulin Human (INSULIN PUMP) SOLN Inject into the skin.     levothyroxine (SYNTHROID) 25 MCG tablet Take 1 tablet (25 mcg total) by mouth daily before breakfast. 30 tablet 1   lidocaine-prilocaine (EMLA) cream Apply to affected area once 30 g 3   nystatin cream (MYCOSTATIN) APPLY TO AFFECTED AREA TWICE A DAY 30 g 1   ondansetron (ZOFRAN) 8 MG tablet Take 1 tablet (8 mg total) by mouth every 8 (eight) hours as needed for nausea or vomiting. Start on the third day after chemotherapy. 60 tablet 2   potassium chloride (KLOR-CON) 10 MEQ tablet TAKE 1 TABLET BY MOUTH 2 TIMES DAILY. 90 tablet 1   pravastatin (PRAVACHOL) 40 MG tablet TAKE 1 TABLET BY MOUTH EVERY DAY 90 tablet 1   prochlorperazine (COMPAZINE) 10 MG tablet Take 1 tablet (10 mg total) by mouth every 6 (six) hours as needed for nausea or vomiting. 60 tablet 2   vitamin B-12 (CYANOCOBALAMIN) 1000 MCG tablet Take 1,000 mcg by mouth daily.     feeding supplement, GLUCERNA SHAKE, (GLUCERNA SHAKE) LIQD Take 237 mLs by mouth 2 (two) times daily between meals. (Patient not taking: Reported on 09/23/2022) 237 mL 0   glucose 4 GM chewable tablet  Chew 1 tablet (4 g total) by mouth once as needed for low blood sugar. (Patient not taking: Reported on 09/23/2022) 50 tablet 0   insulin aspart (NOVOLOG) 100 UNIT/ML injection Inject 6 Units into the skin 3 (three) times daily with meals. (Patient not taking: Reported on 09/14/2022)     levothyroxine (SYNTHROID) 25 MCG tablet Take 25 mcg by mouth daily before breakfast. (Patient not taking: Reported on 09/23/2022)     No current facility-administered medications for this visit.   Facility-Administered Medications Ordered in Other Visits  Medication Dose Route Frequency Provider Last Rate Last Admin   heparin lock flush 100 unit/mL  500 Units Intravenous Once Lloyd Huger, MD       sodium chloride flush (NS) 0.9 % injection 10 mL  10 mL Intravenous PRN Lloyd Huger, MD   10 mL at 02/01/18 0829   sodium chloride  flush (NS) 0.9 % injection 10 mL  10 mL Intravenous PRN Lloyd Huger, MD   10 mL at 04/05/18 0800    OBJECTIVE: Vitals:   09/23/22 1021  BP: 91/76  Pulse: 83  Resp: 18  Temp: 98.3 F (36.8 C)     Body mass index is 41.67 kg/m.    ECOG FS:0 - Asymptomatic  General: Well-developed, well-nourished, no acute distress. HEENT: Normocephalic. Neuro: Alert, answering all questions appropriately. Cranial nerves grossly intact. Psych: Normal affect.   LAB RESULTS:  Lab Results  Component Value Date   NA 138 09/23/2022   K 4.0 09/23/2022   CL 103 09/23/2022   CO2 26 09/23/2022   GLUCOSE 203 (H) 09/23/2022   BUN 32 (H) 09/23/2022   CREATININE 1.39 (H) 09/23/2022   CALCIUM 7.9 (L) 09/23/2022   PROT 6.7 09/23/2022   ALBUMIN 3.5 09/23/2022   AST 21 09/23/2022   ALT 14 09/23/2022   ALKPHOS 85 09/23/2022   BILITOT 0.7 09/23/2022   GFRNONAA 53 (L) 09/23/2022   GFRAA 41 (L) 01/24/2020    Lab Results  Component Value Date   WBC 4.8 09/23/2022   NEUTROABS 4.0 09/23/2022   HGB 14.1 09/23/2022   HCT 43.1 09/23/2022   MCV 91.1 09/23/2022   PLT 110 (L)  09/23/2022   Lab Results  Component Value Date   IRON 13 (L) 08/11/2017   TIBC 223 (L) 01/12/2017   IRONPCTSAT 3.8 (L) 08/11/2017   Lab Results  Component Value Date   FERRITIN 42.0 08/11/2017     STUDIES:   ASSESSMENT: Recurrent low-grade mantle cell lymphoma. PLAN:    1. Mantle cell lymphoma, stage III: Patient's initial diagnosis was on inguinal lymph node biopsy on July 12, 2014.  Patient was noted to have progressive disease and underwent chemotherapy using Rituxan and Treanda completing cycle 4 on Mar 09, 2018.  His most recent CT scan on August 07, 2022 reviewed independently with clearly progressive disease with increasing lymphadenopathy.  PET scan results from August 24, 2022 confirmed recurrent disease.  Since patient is over 4 years removed from his last treatment, can repeat chemotherapy using Treanda and Rituxan.  Plan to do 4-6 cycles every 4 weeks.  Patient tolerated cycle 1 of treatment 2 weeks ago with only a mild increase in fatigue and nausea.  Return to clinic as previously scheduled in 2 weeks for further evaluation and consideration of cycle 2, day 1.  Plan to repeat PET scan after cycle 4.   2. Decreased ejection fraction: Improved.  Patient's most recent cardiac echo in November 2020 revealed an ejection fraction estimated to be 50 to 55%. Continue monitoring and evaluation per cardiology. 3.  Hyperglycemia: Chronic and unchanged.  Patient's blood glucose remains chronically elevated. Continue insulin pump as directed. 4. Thrombocytopenia: Chronic and unchanged.  Patient's platelet count is 110.  5.  Bradycardia: Patient has pacemaker in place. 6.  Renal insufficiency: Creatinine mildly improved to 1.39.  Continue follow-up with nephrology as scheduled. 7.  Nausea: Patient has been instructed to use his antiemetics as prescribed.  Patient expressed understanding and was in agreement with this plan. He also understands that He can call clinic at any time with  any questions, concerns, or complaints.    Lloyd Huger, MD   09/23/2022 11:06 AM

## 2022-09-23 NOTE — Progress Notes (Signed)
Patient here for follow up. Reports he feels tired and with no energy

## 2022-10-07 ENCOUNTER — Ambulatory Visit: Payer: 59

## 2022-10-07 ENCOUNTER — Other Ambulatory Visit: Payer: Self-pay | Admitting: Oncology

## 2022-10-07 ENCOUNTER — Other Ambulatory Visit: Payer: 59

## 2022-10-07 ENCOUNTER — Ambulatory Visit: Payer: 59 | Admitting: Oncology

## 2022-10-07 DIAGNOSIS — C8318 Mantle cell lymphoma, lymph nodes of multiple sites: Secondary | ICD-10-CM

## 2022-10-07 MED FILL — Dexamethasone Sodium Phosphate Inj 100 MG/10ML: INTRAMUSCULAR | Qty: 1 | Status: AC

## 2022-10-08 ENCOUNTER — Inpatient Hospital Stay: Payer: Medicare Other

## 2022-10-08 ENCOUNTER — Inpatient Hospital Stay (HOSPITAL_BASED_OUTPATIENT_CLINIC_OR_DEPARTMENT_OTHER): Payer: Medicare Other | Admitting: Oncology

## 2022-10-08 ENCOUNTER — Encounter: Payer: Self-pay | Admitting: Oncology

## 2022-10-08 ENCOUNTER — Ambulatory Visit: Payer: 59

## 2022-10-08 VITALS — BP 115/62 | HR 88 | Resp 18

## 2022-10-08 DIAGNOSIS — Z5111 Encounter for antineoplastic chemotherapy: Secondary | ICD-10-CM | POA: Diagnosis not present

## 2022-10-08 DIAGNOSIS — Z5112 Encounter for antineoplastic immunotherapy: Secondary | ICD-10-CM | POA: Diagnosis not present

## 2022-10-08 DIAGNOSIS — C8318 Mantle cell lymphoma, lymph nodes of multiple sites: Secondary | ICD-10-CM

## 2022-10-08 DIAGNOSIS — I110 Hypertensive heart disease with heart failure: Secondary | ICD-10-CM | POA: Diagnosis not present

## 2022-10-08 DIAGNOSIS — D696 Thrombocytopenia, unspecified: Secondary | ICD-10-CM | POA: Diagnosis not present

## 2022-10-08 DIAGNOSIS — E1142 Type 2 diabetes mellitus with diabetic polyneuropathy: Secondary | ICD-10-CM | POA: Diagnosis not present

## 2022-10-08 DIAGNOSIS — C8315 Mantle cell lymphoma, lymph nodes of inguinal region and lower limb: Secondary | ICD-10-CM | POA: Diagnosis not present

## 2022-10-08 LAB — COMPREHENSIVE METABOLIC PANEL
ALT: 12 U/L (ref 0–44)
AST: 22 U/L (ref 15–41)
Albumin: 3.6 g/dL (ref 3.5–5.0)
Alkaline Phosphatase: 120 U/L (ref 38–126)
Anion gap: 9 (ref 5–15)
BUN: 31 mg/dL — ABNORMAL HIGH (ref 8–23)
CO2: 23 mmol/L (ref 22–32)
Calcium: 7.8 mg/dL — ABNORMAL LOW (ref 8.9–10.3)
Chloride: 106 mmol/L (ref 98–111)
Creatinine, Ser: 1.81 mg/dL — ABNORMAL HIGH (ref 0.61–1.24)
GFR, Estimated: 39 mL/min — ABNORMAL LOW (ref >60)
Glucose, Bld: 203 mg/dL — ABNORMAL HIGH (ref 70–99)
Potassium: 3.9 mmol/L (ref 3.5–5.1)
Sodium: 138 mmol/L (ref 135–145)
Total Bilirubin: 0.7 mg/dL (ref 0.3–1.2)
Total Protein: 6.6 g/dL (ref 6.5–8.1)

## 2022-10-08 LAB — CBC WITH DIFFERENTIAL/PLATELET
Abs Immature Granulocytes: 0.02 10*3/uL (ref 0.00–0.07)
Basophils Absolute: 0.1 10*3/uL (ref 0.0–0.1)
Basophils Relative: 1 %
Eosinophils Absolute: 0.1 10*3/uL (ref 0.0–0.5)
Eosinophils Relative: 3 %
HCT: 38.6 % — ABNORMAL LOW (ref 39.0–52.0)
Hemoglobin: 13.7 g/dL (ref 13.0–17.0)
Immature Granulocytes: 1 %
Lymphocytes Relative: 10 %
Lymphs Abs: 0.4 10*3/uL — ABNORMAL LOW (ref 0.7–4.0)
MCH: 31.1 pg (ref 26.0–34.0)
MCHC: 35.5 g/dL (ref 30.0–36.0)
MCV: 87.5 fL (ref 80.0–100.0)
Monocytes Absolute: 0.3 10*3/uL (ref 0.1–1.0)
Monocytes Relative: 8 %
Neutro Abs: 3.3 10*3/uL (ref 1.7–7.7)
Neutrophils Relative %: 77 %
Platelets: 81 10*3/uL — ABNORMAL LOW (ref 150–400)
RBC: 4.41 MIL/uL (ref 4.22–5.81)
RDW: 13.2 % (ref 11.5–15.5)
WBC: 4.2 10*3/uL (ref 4.0–10.5)
nRBC: 0 % (ref 0.0–0.2)

## 2022-10-08 MED ORDER — ACETAMINOPHEN 325 MG PO TABS
650.0000 mg | ORAL_TABLET | Freq: Once | ORAL | Status: AC
  Administered 2022-10-08: 650 mg via ORAL
  Filled 2022-10-08: qty 2

## 2022-10-08 MED ORDER — PALONOSETRON HCL INJECTION 0.25 MG/5ML
0.2500 mg | Freq: Once | INTRAVENOUS | Status: AC
  Administered 2022-10-08: 0.25 mg via INTRAVENOUS
  Filled 2022-10-08: qty 5

## 2022-10-08 MED ORDER — HEPARIN SOD (PORK) LOCK FLUSH 100 UNIT/ML IV SOLN
500.0000 [IU] | Freq: Once | INTRAVENOUS | Status: AC | PRN
  Administered 2022-10-08: 500 [IU]
  Filled 2022-10-08: qty 5

## 2022-10-08 MED ORDER — SODIUM CHLORIDE 0.9 % IV SOLN
160.0000 mg | Freq: Once | INTRAVENOUS | Status: AC
  Administered 2022-10-08: 160 mg via INTRAVENOUS
  Filled 2022-10-08: qty 6.4

## 2022-10-08 MED ORDER — SODIUM CHLORIDE 0.9 % IV SOLN
375.0000 mg/m2 | Freq: Once | INTRAVENOUS | Status: AC
  Administered 2022-10-08: 900 mg via INTRAVENOUS
  Filled 2022-10-08: qty 90

## 2022-10-08 MED ORDER — SODIUM CHLORIDE 0.9 % IV SOLN
10.0000 mg | Freq: Once | INTRAVENOUS | Status: AC
  Administered 2022-10-08: 10 mg via INTRAVENOUS
  Filled 2022-10-08: qty 10

## 2022-10-08 MED ORDER — SODIUM CHLORIDE 0.9 % IV SOLN: 375.0000 mg/m2 | Freq: Once | INTRAVENOUS | Status: DC

## 2022-10-08 MED ORDER — SODIUM CHLORIDE 0.9 % IV SOLN
375.0000 mg/m2 | Freq: Once | INTRAVENOUS | Status: DC
  Filled 2022-10-08: qty 90

## 2022-10-08 MED ORDER — SODIUM CHLORIDE 0.9 % IV SOLN
Freq: Once | INTRAVENOUS | Status: AC
  Filled 2022-10-08: qty 250

## 2022-10-08 MED ORDER — DIPHENHYDRAMINE HCL 50 MG/ML IJ SOLN
12.5000 mg | Freq: Once | INTRAMUSCULAR | Status: AC
  Administered 2022-10-08: 12.5 mg via INTRAVENOUS
  Filled 2022-10-08: qty 1

## 2022-10-08 MED FILL — Dexamethasone Sodium Phosphate Inj 100 MG/10ML: INTRAMUSCULAR | Qty: 1 | Status: AC

## 2022-10-08 NOTE — Patient Instructions (Signed)
[facility] AT [place]  Discharge Instructions: Thank you for choosing [facility] to provide your oncology and hematology care.  If you have a lab appointment with the [facility], please go directly to the [facility] and check in at the registration area.  Wear comfortable clothing and clothing appropriate for easy access to any Portacath or PICC line.   We strive to give you quality time with your provider. You may need to reschedule your appointment if you arrive late (15 or more minutes).  Arriving late affects you and other patients whose appointments are after yours.  Also, if you miss three or more appointments without notifying the office, you may be dismissed from the clinic at the provider's discretion.      For prescription refill requests, have your pharmacy contact our office and allow 72 hours for refills to be completed.    Today you received the following chemotherapy and/or immunotherapy agents TRUXIMA and BENDEKA      To help prevent nausea and vomiting after your treatment, we encourage you to take your nausea medication as directed.  BELOW ARE SYMPTOMS THAT SHOULD BE REPORTED IMMEDIATELY: *FEVER GREATER THAN 100.4 F (38 C) OR HIGHER *CHILLS OR SWEATING *NAUSEA AND VOMITING THAT IS NOT CONTROLLED WITH YOUR NAUSEA MEDICATION *UNUSUAL SHORTNESS OF BREATH *UNUSUAL BRUISING OR BLEEDING *URINARY PROBLEMS (pain or burning when urinating, or frequent urination) *BOWEL PROBLEMS (unusual diarrhea, constipation, pain near the anus) TENDERNESS IN MOUTH AND THROAT WITH OR WITHOUT PRESENCE OF ULCERS (sore throat, sores in mouth, or a toothache) UNUSUAL RASH, SWELLING OR PAIN  UNUSUAL VAGINAL DISCHARGE OR ITCHING   Items with * indicate a potential emergency and should be followed up as soon as possible or go to the Emergency Department if any problems should occur.  Please show the CHEMOTHERAPY ALERT CARD or IMMUNOTHERAPY ALERT CARD at  check-in to the Emergency Department and triage nurse.  Should you have questions after your visit or need to cancel or reschedule your appointment, please contact [facility] [place] [facility] [place]  [phone] and follow the prompts.  Office hours are 8:00 a.m. to 4:30 p.m. Monday - Friday. Please note that voicemails left after 4:00 p.m. may not be returned until the following business day.  We are closed weekends and major holidays. You have access to a nurse at all times for urgent questions. Please call the main number to the clinic [phone] and follow the prompts.  For any non-urgent questions, you may also contact your provider using MyChart. We now offer e-Visits for anyone 79 and older to request care online for non-urgent symptoms. For details visit [email or web address].   Also download the MyChart app! Go to the app store, search "MyChart", open the app, select [name], and log in with your MyChart username and password.  Rituximab Injection What is this medication? RITUXIMAB (ri TUX i mab) treats leukemia and lymphoma. It works by blocking a protein that causes cancer cells to grow and multiply. This helps to slow or stop the spread of cancer cells. It may also be used to treat autoimmune conditions, such as arthritis. It works by slowing down an overactive immune system. It is a monoclonal antibody. This medicine may be used for other purposes; ask your health care provider or pharmacist if you have questions. COMMON BRAND NAME(S): RIABNI, Rituxan, RUXIENCE, truxima What should I tell my care team before I take this medication? They need to know if you have any of these  conditions: Chest pain Heart disease Immune system problems Infection, such as chickenpox, cold sores, hepatitis B, herpes Irregular heartbeat or rhythm Kidney disease Low blood counts, such as low white cells, platelets, red cells Lung disease Recent or upcoming vaccine An unusual or  allergic reaction to rituximab, other medications, foods, dyes, or preservatives Pregnant or trying to get pregnant Breast-feeding How should I use this medication? This medication is injected into a vein. It is given by a care team in a hospital or clinic setting. A special MedGuide will be given to you before each treatment. Be sure to read this information carefully each time. Talk to your care team about the use of this medication in children. While this medication may be prescribed for children as young as 6 months for selected conditions, precautions do apply. Overdosage: If you think you have taken too much of this medicine contact a poison control center or emergency room at once. NOTE: This medicine is only for you. Do not share this medicine with others. What if I miss a dose? Keep appointments for follow-up doses. It is important not to miss your dose. Call your care team if you are unable to keep an appointment. What may interact with this medication? Do not take this medication with any of the following: Live vaccines This medication may also interact with the following: Cisplatin This list may not describe all possible interactions. Give your health care provider a list of all the medicines, herbs, non-prescription drugs, or dietary supplements you use. Also tell them if you smoke, drink alcohol, or use illegal drugs. Some items may interact with your medicine. What should I watch for while using this medication? Your condition will be monitored carefully while you are receiving this medication. You may need blood work while taking this medication. This medication can cause serious infusion reactions. To reduce the risk your care team may give you other medications to take before receiving this one. Be sure to follow the directions from your care team. This medication may increase your risk of getting an infection. Call your care team for advice if you get a fever, chills, sore  throat, or other symptoms of a cold or flu. Do not treat yourself. Try to avoid being around people who are sick. Call your care team if you are around anyone with measles, chickenpox, or if you develop sores or blisters that do not heal properly. Avoid taking medications that contain aspirin, acetaminophen, ibuprofen, naproxen, or ketoprofen unless instructed by your care team. These medications may hide a fever. This medication may cause serious skin reactions. They can happen weeks to months after starting the medication. Contact your care team right away if you notice fevers or flu-like symptoms with a rash. The rash may be red or purple and then turn into blisters or peeling of the skin. You may also notice a red rash with swelling of the face, lips, or lymph nodes in your neck or under your arms. In some patients, this medication may cause a serious brain infection that may cause death. If you have any problems seeing, thinking, speaking, walking, or standing, tell your care team right away. If you cannot reach your care team, urgently seek another source of medical care. Talk to your care team if you may be pregnant. Serious birth defects can occur if you take this medication during pregnancy and for 12 months after the last dose. You will need a negative pregnancy test before starting this medication. Contraception is recommended  while taking this medication and for 12 months after the last dose. Your care team can help you find the option that works for you. Do not breastfeed while taking this medication and for at least 6 months after the last dose. What side effects may I notice from receiving this medication? Side effects that you should report to your care team as soon as possible: Allergic reactions or angioedema--skin rash, itching or hives, swelling of the face, eyes, lips, tongue, arms, or legs, trouble swallowing or breathing Bowel blockage--stomach cramping, unable to have a bowel  movement or pass gas, loss of appetite, vomiting Dizziness, loss of balance or coordination, confusion or trouble speaking Heart attack--pain or tightness in the chest, shoulders, arms, or jaw, nausea, shortness of breath, cold or clammy skin, feeling faint or lightheaded Heart rhythm changes--fast or irregular heartbeat, dizziness, feeling faint or lightheaded, chest pain, trouble breathing Infection--fever, chills, cough, sore throat, wounds that don't heal, pain or trouble when passing urine, general feeling of discomfort or being unwell Infusion reactions--chest pain, shortness of breath or trouble breathing, feeling faint or lightheaded Kidney injury--decrease in the amount of urine, swelling of the ankles, hands, or feet Liver injury--right upper belly pain, loss of appetite, nausea, light-colored stool, dark yellow or brown urine, yellowing skin or eyes, unusual weakness or fatigue Redness, blistering, peeling, or loosening of the skin, including inside the mouth Stomach pain that is severeBendamustine Injection What is this medication? BENDAMUSTINE (BEN da MUS teen) treats leukemia and lymphoma. It works by slowing down the growth of cancer cells. This medicine may be used for other purposes; ask your health care provider or pharmacist if you have questions. COMMON BRAND NAME(S): Oren Beckmann, VIVIMUSTA What should I tell my care team before I take this medication? They need to know if you have any of these conditions: Infection, especially a viral infection, such as chickenpox, cold sores, herpes Kidney disease Liver disease An unusual or allergic reaction to bendamustine, mannitol, other medications, foods, dyes, or preservatives Pregnant or trying to get pregnant Breast-feeding How should I use this medication? This medication is injected into a vein. It is given by your care team in a hospital or clinic setting. Talk to your care team about the use of this medication  in children. Special care may be needed. Overdosage: If you think you have taken too much of this medicine contact a poison control center or emergency room at once. NOTE: This medicine is only for you. Do not share this medicine with others. What if I miss a dose? Keep appointments for follow-up doses. It is important not to miss your dose. Call your care team if you are unable to keep an appointment. What may interact with this medication? Do not take this medication with any of the following: Clozapine This medication may also interact with the following: Atazanavir Cimetidine Ciprofloxacin Enoxacin Fluvoxamine Medications for seizures, such as carbamazepine, phenobarbital Mexiletine Rifampin Tacrine Thiabendazole Zileuton This list may not describe all possible interactions. Give your health care provider a list of all the medicines, herbs, non-prescription drugs, or dietary supplements you use. Also tell them if you smoke, drink alcohol, or use illegal drugs. Some items may interact with your medicine. What should I watch for while using this medication? Visit your care team for regular checks on your progress. This medication may make you feel generally unwell. This is not uncommon, as chemotherapy can affect healthy cells as well as cancer cells. Report any side effects. Continue your  course of treatment even though you feel ill unless your care team tells you to stop. You may need blood work while taking this medication. This medication may increase your risk of getting an infection. Call your care team for advice if you get a fever, chills, sore throat, or other symptoms of a cold or flu. Do not treat yourself. Try to avoid being around people who are sick. This medication may cause serious skin reactions. They can happen weeks to months after starting the medication. Contact your care team right away if you notice fevers or flu-like symptoms with a rash. The rash may be red or  purple and then turn into blisters or peeling of the skin. You may also notice a red rash with swelling of the face, lips, or lymph nodes in your neck or under your arms. In some patients, this medication may cause a serious brain infection that may cause death. If you have any problems seeing, thinking, speaking, walking, or standing, tell your care team right away. If you cannot reach your care team, urgently seek other source of medical care. This medication may increase your risk to bruise or bleed. Call your care team if you notice any unusual bleeding. Talk to your care team about your risk of cancer. You may be more at risk for certain types of cancer if you take this medication. Talk to your care team about your risk of skin cancer. You may be more at risk for skin cancer if you take this medication. Talk to your care team if you or your partner wish to become pregnant or think either of you might be pregnant. This medication can cause serious birth defects if taken during pregnancy or for up to 6 months after the last dose. A negative pregnancy test is required before starting this medication. A reliable form of contraception is recommended while taking this medication and for 6 months after the last dose. Talk to your care team about reliable forms of contraception. Wear a condom while taking this medication and for at least 3 months after the last dose. Do not breast-feed while taking this medication or for at least 1 week after the last dose. This medication may cause infertility. Talk to your care team if you are concerned about your fertility. What side effects may I notice from receiving this medication? Side effects that you should report to your care team as soon as possible: Allergic reactions--skin rash, itching, hives, swelling of the face, lips, tongue, or throat Infection--fever, chills, cough, sore throat, wounds that don't heal, pain or trouble when passing urine, general feeling  of discomfort or being unwell Infusion reactions--chest pain, shortness of breath or trouble breathing, feeling faint or lightheaded Liver injury--right upper belly pain, loss of appetite, nausea, light-colored stool, dark yellow or brown urine, yellowing skin or eyes, unusual weakness or fatigue Low red blood cell level--unusual weakness or fatigue, dizziness, headache, trouble breathing Painful swelling, warmth, or redness of the skin, blisters or sores at the infusion site Rash, fever, and swollen lymph nodes Redness, blistering, peeling, or loosening of the skin, including inside the mouth Tumor lysis syndrome (TLS)--nausea, vomiting, diarrhea, decrease in the amount of urine, dark urine, unusual weakness or fatigue, confusion, muscle pain or cramps, fast or irregular heartbeat, joint pain Unusual bruising or bleeding Side effects that usually do not require medical attention (report to your care team if they continue or are bothersome): Diarrhea Fatigue Headache Loss of appetite Nausea Vomiting This list may  not describe all possible side effects. Call your doctor for medical advice about side effects. You may report side effects to FDA at 1-800-FDA-1088. Where should I keep my medication? This medication is given in a hospital or clinic. It will not be stored at home. NOTE: This sheet is a summary. It may not cover all possible information. If you have questions about this medicine, talk to your doctor, pharmacist, or health care provider.  2023 [employer] Standard (2022-01-05 00:00:00) , does not go away, or gets worse Tumor lysis syndrome (TLS)--nausea, vomiting, diarrhea, decrease in the amount of urine, dark urine, unusual weakness or fatigue, confusion, muscle pain or cramps, fast or irregular heartbeat, joint pain Side effects that usually do not require medical attention (report to your care team if they continue or are bothersome): Headache Joint pain Nausea Runny or  stuffy nose Unusual weakness or fatigue This list may not describe all possible side effects. Call your doctor for medical advice about side effects. You may report side effects to FDA at 1-800-FDA-1088. Where should I keep my medication? This medication is given in a hospital or clinic. It will not be stored at home. NOTE: This sheet is a summary. It may not cover all possible information. If you have questions about this medicine, talk to your doctor, pharmacist, or health care provider.  2023 [employer] Standard (2022-02-10 00:00:00)

## 2022-10-08 NOTE — Progress Notes (Signed)
[place]  Telephone:([phone]) [phone] Fax:(336) [phone]  ID: Gary [name] OB: [birth year]  MR#: [id number]  LDJ#:[id number]  Patient Care Team: [name], MD as PCP - General (Internal Medicine) Lloyd Huger, MD as Consulting Physician (Oncology) Lucky Cowboy Erskine Squibb, MD as Referring Physician (Vascular Surgery) Schnier, Dolores Lory, MD (Vascular Surgery) Corey Skains, MD as Consulting Physician (Cardiology)  CHIEF COMPLAINT: Recurrent Mantle cell lymphoma.  INTERVAL HISTORY: Patient returns to clinic today for further evaluation and consideration of cycle 2 of Rituxan and Treanda.  He currently feels well and is asymptomatic.  He does not complain of any weakness or fatigue.  He denies any fevers, chills, or night sweats.  He has no neurologic complaints.  He denies any chest pain, shortness of breath, cough, or hemoptysis.  He has no nausea, vomiting, constipation, or diarrhea.  He denies any melena or hematochezia.  He has no urinary complaints.  Patient offers no specific complaints today.  Review of Systems  Constitutional: Negative.  Negative for diaphoresis, fever, malaise/fatigue and weight loss.  Respiratory: Negative.  Negative for cough, hemoptysis and shortness of breath.   Cardiovascular: Negative.  Negative for chest pain and leg swelling.  Gastrointestinal: Negative.  Negative for abdominal pain.  Genitourinary: Negative.  Negative for dysuria.  Musculoskeletal: Negative.  Negative for back pain.  Skin: Negative.  Negative for rash.  Neurological: Negative.  Negative for dizziness, focal weakness, weakness and headaches.  Psychiatric/Behavioral: Negative.  The patient is not nervous/anxious.     As per HPI. Otherwise, a complete review of systems is negative.  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    Gastroparesis    Graves disease    Hypercholesterolemia    Hypertension    Mantle cell lymphoma ([place]) 07/2014   Peripheral neuropathy    Shingles     PAST SURGICAL HISTORY: Past Surgical History:  Procedure Laterality Date   CHOLECYSTECTOMY     NEPHRECTOMY     right   PACEMAKER INSERTION N/A 12/07/2017   Procedure: INSERTION PACEMAKER DUAL CHAMBER INITIAL INSERT;  Surgeon: Isaias Cowman, MD;  Location: [facility] ORS;  Service: Cardiovascular;  Laterality: N/A;   PORTA CATH INSERTION N/A 11/03/2017   Procedure: PORTA CATH INSERTION;  Surgeon: Katha Cabal, MD;  Location: [facility];  Service: Cardiovascular;  Laterality: N/A;    FAMILY HISTORY Family History  Problem Relation Age of Onset   Breast cancer Mother    Diabetes Father    Cancer Father        Lymphoma   Alcohol abuse Maternal Uncle    Diabetes Sister    Heart disease Other        grandfather       ADVANCED DIRECTIVES:    HEALTH MAINTENANCE: Social History   Tobacco Use   Smoking status: Former    Types: Cigarettes    Passive exposure: Past   Smokeless tobacco: Current    Types: Chew  Vaping Use   Vaping Use: Never used  Substance Use Topics   Alcohol use: Yes    Alcohol/week: 3.0 standard drinks of alcohol    Types: 3 Cans of beer per week    Comment: nightly   Drug use: Never     Colonoscopy:  PAP:  Bone density:  Lipid panel:  Allergies  Allergen  Reactions   Rofecoxib Nausea Only    Current Outpatient Medications  Medication Sig Dispense Refill   acyclovir (ZOVIRAX) 400 MG tablet Take 1 tablet (400 mg total) by mouth daily. 30 tablet 3   bacitracin 500 UNIT/GM ointment Apply 1 application topically daily.     Cholecalciferol (VITAMIN D3) 50 MCG (2000 UT) CAPS Take 2,000 Units by mouth daily.     ferrous sulfate 325 (65 FE) MG tablet Take 325 mg by mouth daily with breakfast.     furosemide (LASIX) 40 MG tablet Take 2 tablets in the  morning, and 1 tablet in the evening. 90 tablet 6   Insulin Human (INSULIN PUMP) SOLN Inject into the skin.     levothyroxine (SYNTHROID) 25 MCG tablet Take 1 tablet (25 mcg total) by mouth daily before breakfast. 30 tablet 1   lidocaine-prilocaine (EMLA) cream Apply to affected area once 30 g 3   nystatin cream (MYCOSTATIN) APPLY TO AFFECTED AREA TWICE A DAY 30 g 1   ondansetron (ZOFRAN) 8 MG tablet Take 1 tablet (8 mg total) by mouth every 8 (eight) hours as needed for nausea or vomiting. Start on the third day after chemotherapy. 60 tablet 2   potassium chloride (KLOR-CON) 10 MEQ tablet TAKE 1 TABLET BY MOUTH 2 TIMES DAILY. 90 tablet 1   pravastatin (PRAVACHOL) 40 MG tablet TAKE 1 TABLET BY MOUTH EVERY DAY 90 tablet 1   prochlorperazine (COMPAZINE) 10 MG tablet TAKE 1 TABLET BY MOUTH EVERY 6 HOURS AS NEEDED FOR NAUSEA OR VOMITING. 60 tablet 2   vitamin B-12 (CYANOCOBALAMIN) 1000 MCG tablet Take 1,000 mcg by mouth daily.     feeding supplement, GLUCERNA SHAKE, (GLUCERNA SHAKE) LIQD Take 237 mLs by mouth 2 (two) times daily between meals. (Patient not taking: Reported on 09/23/2022) 237 mL 0   glucose 4 GM chewable tablet Chew 1 tablet (4 g total) by mouth once as needed for low blood sugar. (Patient not taking: Reported on 09/23/2022) 50 tablet 0   insulin aspart (NOVOLOG) 100 UNIT/ML injection Inject 6 Units into the skin 3 (three) times daily with meals. (Patient not taking: Reported on 09/14/2022)     levothyroxine (SYNTHROID) 25 MCG tablet Take 25 mcg by mouth daily before breakfast. (Patient not taking: Reported on 09/23/2022)     No current facility-administered medications for this visit.   Facility-Administered Medications Ordered in Other Visits  Medication Dose Route Frequency Provider Last Rate Last Admin   heparin lock flush 100 unit/mL  500 Units Intravenous Once Lloyd Huger, MD       sodium chloride flush (NS) 0.9 % injection 10 mL  10 mL Intravenous PRN Lloyd Huger, MD   10 mL at 02/01/18 0829   sodium chloride flush (NS) 0.9 % injection 10 mL  10 mL Intravenous PRN Lloyd Huger, MD   10 mL at 04/05/18 0800    OBJECTIVE: Vitals:   10/08/22 0831  BP: 100/68  Pulse: 91  Resp: 18  Temp: 97.8 F (36.6 C)  SpO2: 98%     Body mass index is 40.6 kg/m.    ECOG FS:0 - Asymptomatic  General: Well-developed, well-nourished, no acute distress. Eyes: Pink conjunctiva, anicteric sclera. HEENT: Normocephalic, moist mucous membranes. Lungs: No audible wheezing or coughing. Heart: Regular rate and rhythm. Abdomen: Soft, nontender, no obvious distention. Musculoskeletal: No edema, cyanosis, or clubbing. Neuro: Alert, answering all questions appropriately. Cranial nerves grossly intact. Skin: No rashes or petechiae noted. Psych: Normal affect.  LAB  RESULTS:  Lab Results  Component Value Date   NA 138 10/08/2022   K 3.9 10/08/2022   CL 106 10/08/2022   CO2 23 10/08/2022   GLUCOSE 203 (H) 10/08/2022   BUN 31 (H) 10/08/2022   CREATININE 1.81 (H) 10/08/2022   CALCIUM 7.8 (L) 10/08/2022   PROT 6.6 10/08/2022   ALBUMIN 3.6 10/08/2022   AST 22 10/08/2022   ALT 12 10/08/2022   ALKPHOS 120 10/08/2022   BILITOT 0.7 10/08/2022   GFRNONAA 39 (L) 10/08/2022   GFRAA 41 (L) 01/24/2020    Lab Results  Component Value Date   WBC 4.2 10/08/2022   NEUTROABS 3.3 10/08/2022   HGB 13.7 10/08/2022   HCT 38.6 (L) 10/08/2022   MCV 87.5 10/08/2022   PLT 81 (L) 10/08/2022   Lab Results  Component Value Date   IRON 13 (L) 08/11/2017   TIBC 223 (L) 01/12/2017   IRONPCTSAT 3.8 (L) 08/11/2017   Lab Results  Component Value Date   FERRITIN 42.0 08/11/2017     STUDIES:   ASSESSMENT: Recurrent low-grade mantle cell lymphoma. PLAN:    1. Mantle cell lymphoma, stage III: Patient's initial diagnosis was on inguinal lymph node biopsy on July 12, 2014.  Patient was noted to have progressive disease and underwent chemotherapy using Rituxan and  Treanda completing cycle 4 on Mar 09, 2018.  His most recent CT scan on August 07, 2022 reviewed independently with clearly progressive disease with increasing lymphadenopathy.  PET scan results from August 24, 2022 confirmed recurrent disease.  Since patient is over 4 years removed from his last treatment, can repeat chemotherapy using Treanda and Rituxan.  Plan to do 4-6 cycles every 4 weeks.  Given patient's persistent thrombocytopenia, will dose reduced Treanda.  Proceed with cycle 2, day 1 of treatment.  Return to clinic tomorrow for [place] only and then in 4 weeks for further evaluation and consideration of cycle 3, day 1.  Plan to repeat PET scan after cycle 4.   2. Decreased ejection fraction: Improved.  Patient's most recent cardiac echo in November 2020 revealed an ejection fraction estimated to be 50 to 55%. Continue monitoring and evaluation per cardiology. 3.  Hyperglycemia: Chronic and unchanged.  Patient's blood glucose remains chronically elevated. Continue insulin pump as directed. 4. Thrombocytopenia: Platelet count decreased today.  Dose reduced Treanda as above. 5.  Bradycardia: Patient has pacemaker in place. 6.  Renal insufficiency: Creatinine increased to 1.81.  Neither Treanda or Rituxan need to be dose reduced with renal insufficiency. 7.  Nausea: Patient has been instructed to use his antiemetics as prescribed.  Patient expressed understanding and was in agreement with this plan. He also understands that He can call clinic at any time with any questions, concerns, or complaints.    Lloyd Huger, MD   10/08/2022 8:52 AM

## 2022-10-09 ENCOUNTER — Encounter: Payer: Self-pay | Admitting: Oncology

## 2022-10-09 ENCOUNTER — Inpatient Hospital Stay: Payer: Medicare Other

## 2022-10-09 VITALS — BP 99/58 | HR 99 | Temp 97.8°F

## 2022-10-09 DIAGNOSIS — D696 Thrombocytopenia, unspecified: Secondary | ICD-10-CM | POA: Diagnosis not present

## 2022-10-09 DIAGNOSIS — Z5112 Encounter for antineoplastic immunotherapy: Secondary | ICD-10-CM | POA: Diagnosis not present

## 2022-10-09 DIAGNOSIS — Z5111 Encounter for antineoplastic chemotherapy: Secondary | ICD-10-CM | POA: Diagnosis not present

## 2022-10-09 DIAGNOSIS — C8315 Mantle cell lymphoma, lymph nodes of inguinal region and lower limb: Secondary | ICD-10-CM | POA: Diagnosis not present

## 2022-10-09 DIAGNOSIS — E1142 Type 2 diabetes mellitus with diabetic polyneuropathy: Secondary | ICD-10-CM | POA: Diagnosis not present

## 2022-10-09 DIAGNOSIS — I110 Hypertensive heart disease with heart failure: Secondary | ICD-10-CM | POA: Diagnosis not present

## 2022-10-09 DIAGNOSIS — C8318 Mantle cell lymphoma, lymph nodes of multiple sites: Secondary | ICD-10-CM

## 2022-10-09 MED ORDER — SODIUM CHLORIDE 0.9 % IV SOLN
10.0000 mg | Freq: Once | INTRAVENOUS | Status: AC
  Administered 2022-10-09: 10 mg via INTRAVENOUS
  Filled 2022-10-09: qty 10

## 2022-10-09 MED ORDER — HEPARIN SOD (PORK) LOCK FLUSH 100 UNIT/ML IV SOLN
500.0000 [IU] | Freq: Once | INTRAVENOUS | Status: AC | PRN
  Filled 2022-10-09: qty 5

## 2022-10-09 MED ORDER — SODIUM CHLORIDE 0.9 % IV SOLN
160.0000 mg | Freq: Once | INTRAVENOUS | Status: AC
  Administered 2022-10-09: 160 mg via INTRAVENOUS
  Filled 2022-10-09: qty 6.4

## 2022-10-09 MED ORDER — HEPARIN SOD (PORK) LOCK FLUSH 100 UNIT/ML IV SOLN
INTRAVENOUS | Status: AC
  Administered 2022-10-09: 500 [IU]
  Filled 2022-10-09: qty 5

## 2022-10-09 MED ORDER — SODIUM CHLORIDE 0.9 % IV SOLN
Freq: Once | INTRAVENOUS | Status: AC
  Filled 2022-10-09: qty 250

## 2022-10-09 NOTE — Patient Instructions (Addendum)
[facility] [place]  Discharge Instructions: Thank you for choosing [facility] to provide your oncology and hematology care.  If you have a lab appointment with the [facility], please go directly to the [facility] and check in at the registration area.  Wear comfortable clothing and clothing appropriate for easy access to any Portacath or PICC line.   We strive to give you quality time with your provider. You may need to reschedule your appointment if you arrive late (15 or more minutes).  Arriving late affects you and other patients whose appointments are after yours.  Also, if you miss three or more appointments without notifying the office, you may be dismissed from the clinic at the provider's discretion.      For prescription refill requests, have your pharmacy contact our office and allow 72 hours for refills to be completed.    Today you received the following chemotherapy and/or immunotherapy agents Bendamustine        To help prevent nausea and vomiting after your treatment, we encourage you to take your nausea medication as directed.  BELOW ARE SYMPTOMS THAT SHOULD BE REPORTED IMMEDIATELY: *FEVER GREATER THAN 100.4 F (38 C) OR HIGHER *CHILLS OR SWEATING *NAUSEA AND VOMITING THAT IS NOT CONTROLLED WITH YOUR NAUSEA MEDICATION *UNUSUAL SHORTNESS OF BREATH *UNUSUAL BRUISING OR BLEEDING *URINARY PROBLEMS (pain or burning when urinating, or frequent urination) *BOWEL PROBLEMS (unusual diarrhea, constipation, pain near the anus) TENDERNESS IN MOUTH AND THROAT WITH OR WITHOUT PRESENCE OF ULCERS (sore throat, sores in mouth, or a toothache) UNUSUAL RASH, SWELLING OR PAIN  UNUSUAL VAGINAL DISCHARGE OR ITCHING   Items with * indicate a potential emergency and should be followed up as soon as possible or go to the Emergency Department if any problems should occur.  Please show the CHEMOTHERAPY ALERT CARD or IMMUNOTHERAPY ALERT CARD at check-in  to the Emergency Department and triage nurse.  Should you have questions after your visit or need to cancel or reschedule your appointment, please contact [facility] [place] [facility] [place]  [phone] and follow the prompts.  Office hours are 8:00 a.m. to 4:30 p.m. Monday - Friday. Please note that voicemails left after 4:00 p.m. may not be returned until the following business day.  We are closed weekends and major holidays. You have access to a nurse at all times for urgent questions. Please call the main number to the clinic [phone] and follow the prompts.  For any non-urgent questions, you may also contact your provider using MyChart. We now offer e-Visits for anyone 73 and older to request care online for non-urgent symptoms. For details visit [email or web address].   Also download the MyChart app! Go to the app store, search "MyChart", open the app, select [facility], and log in with your MyChart username and password.

## 2022-10-09 NOTE — Addendum Note (Signed)
Addended by: Charlyn Minerva on: 10/09/2022 02:40 PM   Modules accepted: Orders

## 2022-10-10 ENCOUNTER — Other Ambulatory Visit: Payer: Self-pay

## 2022-10-12 DIAGNOSIS — Z794 Long term (current) use of insulin: Secondary | ICD-10-CM | POA: Insufficient documentation

## 2022-10-12 DIAGNOSIS — I509 Heart failure, unspecified: Secondary | ICD-10-CM | POA: Insufficient documentation

## 2022-10-12 DIAGNOSIS — E0500 Thyrotoxicosis with diffuse goiter without thyrotoxic crisis or storm: Secondary | ICD-10-CM | POA: Insufficient documentation

## 2022-10-12 DIAGNOSIS — Z9641 Presence of insulin pump (external) (internal): Secondary | ICD-10-CM | POA: Insufficient documentation

## 2022-10-12 DIAGNOSIS — E11319 Type 2 diabetes mellitus with unspecified diabetic retinopathy without macular edema: Secondary | ICD-10-CM | POA: Insufficient documentation

## 2022-10-16 ENCOUNTER — Encounter: Payer: Self-pay | Admitting: Internal Medicine

## 2022-10-19 NOTE — Telephone Encounter (Signed)
L/M FOR PT. TO C/B.

## 2022-10-20 ENCOUNTER — Ambulatory Visit (INDEPENDENT_AMBULATORY_CARE_PROVIDER_SITE_OTHER): Payer: Medicare Other | Admitting: Nurse Practitioner

## 2022-10-20 ENCOUNTER — Encounter (INDEPENDENT_AMBULATORY_CARE_PROVIDER_SITE_OTHER): Payer: Self-pay | Admitting: Nurse Practitioner

## 2022-10-20 VITALS — BP 115/89 | HR 87 | Resp 16 | Wt 245.0 lb

## 2022-10-20 DIAGNOSIS — I890 Lymphedema, not elsewhere classified: Secondary | ICD-10-CM | POA: Diagnosis not present

## 2022-10-20 DIAGNOSIS — I10 Essential (primary) hypertension: Secondary | ICD-10-CM

## 2022-10-20 DIAGNOSIS — L03119 Cellulitis of unspecified part of limb: Secondary | ICD-10-CM

## 2022-10-20 DIAGNOSIS — E1151 Type 2 diabetes mellitus with diabetic peripheral angiopathy without gangrene: Secondary | ICD-10-CM | POA: Insufficient documentation

## 2022-10-20 MED ORDER — SULFAMETHOXAZOLE-TRIMETHOPRIM 800-160 MG PO TABS: 1.0000 | ORAL_TABLET | Freq: Two times a day (BID) | ORAL | 0 refills | Status: DC

## 2022-10-20 NOTE — Progress Notes (Signed)
Subjective:    [name], male    DOB: May 04, 1946, 77 y.o.   MRN: [id number] Chief Complaint  Patient presents with   Follow-up    6 week unna check    Gary [name] is a 77 year old male who presents today for follow up and evaluation of his lower extremities due to lymphedema.  The patient notes he has not been in [employer] since prior to Christmas probably the last 3 to 4 weeks.  He was taken out of Unna boots due to being healed by his home health nurse.  The patient unfortunately does not have compression as historically they have been extremely difficult for him to utilize.  During this timeframe he tried to use other methods for compression but they have not been successful.  During this time he has had significant lower extremity edema as well as development of ulcerations and cellulitis.    Review of Systems  Cardiovascular:  Positive for leg swelling.  Skin:  Positive for color change and wound.  All other systems reviewed and are negative.      Objective:   Physical Exam Vitals reviewed.  HENT:     Head: Normocephalic.  Cardiovascular:     Rate and Rhythm: Normal rate.  Pulmonary:     Effort: Pulmonary effort is normal.  Musculoskeletal:     Right lower leg: Edema present.     Left lower leg: Edema present.  Skin:    General: Skin is warm and dry.  Neurological:     Mental Status: He is alert and oriented to person, place, and time.  Psychiatric:        Mood and Affect: Mood normal.        Behavior: Behavior normal.        Thought Content: Thought content normal.        Judgment: Judgment normal.     BP 115/89 (BP Location: Left Arm)   Pulse 87   Resp 16   Wt 245 lb (111.1 kg)   BMI 40.77 kg/m   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    Gastroparesis    Graves disease    Hypercholesterolemia     Hypertension    Mantle cell lymphoma ([place]) 07/2014   Peripheral neuropathy    Shingles     Social History   Socioeconomic History   Marital status: Widowed    Spouse name: Not on file   Number of children: 1   Years of education: 16   Highest education level: 12th grade  Occupational History   Occupation: Retired  Tobacco Use   Smoking status: Former    Types: Cigarettes    Passive exposure: Past   Smokeless tobacco: Current    Types: Chew  Vaping Use   Vaping Use: Never used  Substance and Sexual Activity   Alcohol use: Yes    Alcohol/week: 3.0 standard drinks of alcohol    Types: 3 Cans of beer per week    Comment: nightly   Drug use: Never   Sexual activity: Not Currently  Other Topics Concern   Not on file  Social History Narrative   Not on file   Social Determinants of Health   Financial Resource Strain: Low Risk  (03/10/2022)   Overall Financial Resource Strain (CARDIA)    Difficulty of Paying  Living Expenses: Not hard at all  Food Insecurity: No Food Insecurity (03/10/2022)   Hunger Vital Sign    Worried About Running Out of Food in the Last Year: Never true    Ran Out of Food in the Last Year: Never true  Transportation Needs: Unmet Transportation Needs (10/08/2022)   PRAPARE - Transportation    Lack of Transportation (Medical): Yes    Lack of Transportation (Non-Medical): Yes  Physical Activity: Inactive (03/10/2022)   Exercise Vital Sign    Days of Exercise per Week: 0 days    Minutes of Exercise per Session: 0 min  Stress: No Stress Concern Present (03/10/2022)   [place]    Feeling of Stress : Only a little  Social Connections: Socially Isolated (03/10/2022)   Social Connection and Isolation Panel [NHANES]    Frequency of Communication with Friends and Family: More than three times a week    Frequency of Social Gatherings with Friends and Family: Twice a week    Attends Religious  Services: Never    Marine scientist or Organizations: No    Attends Archivist Meetings: Never    Marital Status: Widowed  Intimate Partner Violence: Not At Risk (03/10/2022)   Humiliation, Afraid, Rape, and Kick questionnaire    Fear of Current or Ex-Partner: No    Emotionally Abused: No    Physically Abused: No    Sexually Abused: No    Past Surgical History:  Procedure Laterality Date   CHOLECYSTECTOMY     NEPHRECTOMY     right   PACEMAKER INSERTION N/A 12/07/2017   Procedure: INSERTION PACEMAKER DUAL CHAMBER INITIAL INSERT;  Surgeon: Isaias Cowman, MD;  Location: [facility] ORS;  Service: Cardiovascular;  Laterality: N/A;   PORTA CATH INSERTION N/A 11/03/2017   Procedure: PORTA CATH INSERTION;  Surgeon: Katha Cabal, MD;  Location: [facility];  Service: Cardiovascular;  Laterality: N/A;    Family History  Problem Relation Age of Onset   Breast cancer Mother    Diabetes Father    Cancer Father        Lymphoma   Alcohol abuse Maternal Uncle    Diabetes Sister    Heart disease Other        grandfather    Allergies  Allergen Reactions   Rofecoxib Nausea Only       Latest Ref Rng & Units 10/08/2022    8:19 AM 09/23/2022    9:41 AM 09/09/2022    9:43 AM  CBC  WBC 4.0 - 10.5 K/uL 4.2  4.8  6.3   Hemoglobin 13.0 - 17.0 g/dL 13.7  14.1  15.0   Hematocrit 39.0 - 52.0 % 38.6  43.1  45.2   Platelets 150 - 400 K/uL 81  110  113       CMP     Component Value Date/Time   NA 138 10/08/2022 0819   NA 138 07/15/2014 0515   K 3.9 10/08/2022 0819   K 3.5 01/16/2015 1429   CL 106 10/08/2022 0819   CL 110 (H) 07/15/2014 0515   CO2 23 10/08/2022 0819   CO2 24 07/15/2014 0515   GLUCOSE 203 (H) 10/08/2022 0819   GLUCOSE 145 (H) 07/15/2014 0515   BUN 31 (H) 10/08/2022 0819   BUN 17 07/15/2014 0515   CREATININE 1.81 (H) 10/08/2022 0819   CREATININE 1.38 (H) 10/17/2021 1624   CALCIUM 7.8 (L) 10/08/2022 0819   CALCIUM 7.1 (  L) 07/15/2014  0515   PROT 6.6 10/08/2022 0819   PROT 6.2 (L) 10/22/2014 1000   ALBUMIN 3.6 10/08/2022 0819   ALBUMIN 2.6 (L) 10/22/2014 1000   AST 22 10/08/2022 0819   AST 19 10/22/2014 1000   ALT 12 10/08/2022 0819   ALT 21 10/22/2014 1000   ALKPHOS 120 10/08/2022 0819   ALKPHOS 118 (H) 10/22/2014 1000   BILITOT 0.7 10/08/2022 0819   BILITOT 0.8 10/22/2014 1000   GFRNONAA 39 (L) 10/08/2022 0819   GFRNONAA >60 01/16/2015 1429   GFRAA 41 (L) 01/24/2020 0925   GFRAA >60 01/16/2015 1429     No results found.     Assessment & Plan:   1. Lymphedema The patient's lymphedema is out of control today due to not having any compression garments for the last several weeks.  Will replace the patient back into Unna boots.  We will continue to change him on a weekly basis in our office.  We will also try to reinstitute home health for the patient.  We also discussed types of compression for the patient we discussed possibly getting a zipper compression which may be a little bit more functional for him.  We also discussed compression wraps which I believe he may fare better with in the long run.  2. Primary hypertension Continue antihypertensive medications as already ordered, these medications have been reviewed and there are no changes at this time.  3. Type 2 diabetes mellitus with diabetic peripheral angiopathy without gangrene, without long-term current use of insulin (HCC) Continue hypoglycemic medications as already ordered, these medications have been reviewed and there are no changes at this time.  Hgb A1C to be monitored as already arranged by primary service  4. Cellulitis of lower extremity, unspecified laterality The patient does have cellulitis which is treated with an antibiotic today.  Will reevaluate at his next follow-up - sulfamethoxazole-trimethoprim (BACTRIM DS) 800-160 MG tablet; Take 1 tablet by mouth 2 (two) times daily.  Dispense: 20 tablet; Refill: 0   Current Outpatient  Medications on File Prior to Visit  Medication Sig Dispense Refill   acyclovir (ZOVIRAX) 400 MG tablet Take 1 tablet (400 mg total) by mouth daily. 30 tablet 3   bacitracin 500 UNIT/GM ointment Apply 1 application topically daily.     Cholecalciferol (VITAMIN D3) 50 MCG (2000 UT) CAPS Take 2,000 Units by mouth daily.     ferrous sulfate 325 (65 FE) MG tablet Take 325 mg by mouth daily with breakfast.     furosemide (LASIX) 40 MG tablet Take 2 tablets in the morning, and 1 tablet in the evening. 90 tablet 6   Insulin Human (INSULIN PUMP) SOLN Inject into the skin.     levothyroxine (SYNTHROID) 25 MCG tablet Take 1 tablet (25 mcg total) by mouth daily before breakfast. 30 tablet 1   lidocaine-prilocaine (EMLA) cream Apply to affected area once 30 g 3   nystatin cream (MYCOSTATIN) APPLY TO AFFECTED AREA TWICE A DAY 30 g 1   ondansetron (ZOFRAN) 8 MG tablet Take 1 tablet (8 mg total) by mouth every 8 (eight) hours as needed for nausea or vomiting. Start on the third day after chemotherapy. 60 tablet 2   potassium chloride (KLOR-CON) 10 MEQ tablet TAKE 1 TABLET BY MOUTH 2 TIMES DAILY. 90 tablet 1   pravastatin (PRAVACHOL) 40 MG tablet TAKE 1 TABLET BY MOUTH EVERY DAY 90 tablet 1   prochlorperazine (COMPAZINE) 10 MG tablet TAKE 1 TABLET BY MOUTH EVERY 6 HOURS  AS NEEDED FOR NAUSEA OR VOMITING. 60 tablet 2   vitamin B-12 (CYANOCOBALAMIN) 1000 MCG tablet Take 1,000 mcg by mouth daily.     feeding supplement, GLUCERNA SHAKE, (GLUCERNA SHAKE) LIQD Take 237 mLs by mouth 2 (two) times daily between meals. (Patient not taking: Reported on 09/23/2022) 237 mL 0   glucose 4 GM chewable tablet Chew 1 tablet (4 g total) by mouth once as needed for low blood sugar. (Patient not taking: Reported on 09/23/2022) 50 tablet 0   insulin aspart (NOVOLOG) 100 UNIT/ML injection Inject 6 Units into the skin 3 (three) times daily with meals. (Patient not taking: Reported on 09/14/2022)     levothyroxine (SYNTHROID) 25 MCG tablet  Take 25 mcg by mouth daily before breakfast. (Patient not taking: Reported on 09/23/2022)     Current Facility-Administered Medications on File Prior to Visit  Medication Dose Route Frequency Provider Last Rate Last Admin   heparin lock flush 100 unit/mL  500 Units Intravenous Once Lloyd Huger, MD       sodium chloride flush (NS) 0.9 % injection 10 mL  10 mL Intravenous PRN Lloyd Huger, MD   10 mL at 02/01/18 0829   sodium chloride flush (NS) 0.9 % injection 10 mL  10 mL Intravenous PRN Lloyd Huger, MD   10 mL at 04/05/18 0800    There are no Patient Instructions on file for this visit. No follow-ups on file.   Kris Hartmann, NP

## 2022-10-21 ENCOUNTER — Telehealth (INDEPENDENT_AMBULATORY_CARE_PROVIDER_SITE_OTHER): Payer: Self-pay

## 2022-10-21 NOTE — Telephone Encounter (Signed)
Home health orders for weekly bilateral unna wraps has been faxed to [facility] home health. Patient has been notified that home will start next week.

## 2022-10-21 NOTE — Telephone Encounter (Signed)
Lm for pt to cb.

## 2022-10-23 DIAGNOSIS — I509 Heart failure, unspecified: Secondary | ICD-10-CM | POA: Diagnosis not present

## 2022-10-23 DIAGNOSIS — I872 Venous insufficiency (chronic) (peripheral): Secondary | ICD-10-CM | POA: Diagnosis not present

## 2022-10-23 DIAGNOSIS — L03116 Cellulitis of left lower limb: Secondary | ICD-10-CM | POA: Diagnosis not present

## 2022-10-23 DIAGNOSIS — F32A Depression, unspecified: Secondary | ICD-10-CM | POA: Diagnosis not present

## 2022-10-23 DIAGNOSIS — L97812 Non-pressure chronic ulcer of other part of right lower leg with fat layer exposed: Secondary | ICD-10-CM | POA: Diagnosis not present

## 2022-10-23 DIAGNOSIS — Z9181 History of falling: Secondary | ICD-10-CM | POA: Diagnosis not present

## 2022-10-23 DIAGNOSIS — Z950 Presence of cardiac pacemaker: Secondary | ICD-10-CM | POA: Diagnosis not present

## 2022-10-23 DIAGNOSIS — L03115 Cellulitis of right lower limb: Secondary | ICD-10-CM | POA: Diagnosis not present

## 2022-10-23 DIAGNOSIS — E7800 Pure hypercholesterolemia, unspecified: Secondary | ICD-10-CM | POA: Diagnosis not present

## 2022-10-23 DIAGNOSIS — C8310 Mantle cell lymphoma, unspecified site: Secondary | ICD-10-CM | POA: Diagnosis not present

## 2022-10-23 DIAGNOSIS — Z6836 Body mass index (BMI) 36.0-36.9, adult: Secondary | ICD-10-CM | POA: Insufficient documentation

## 2022-10-23 DIAGNOSIS — E0500 Thyrotoxicosis with diffuse goiter without thyrotoxic crisis or storm: Secondary | ICD-10-CM | POA: Diagnosis not present

## 2022-10-23 DIAGNOSIS — E1151 Type 2 diabetes mellitus with diabetic peripheral angiopathy without gangrene: Secondary | ICD-10-CM | POA: Diagnosis not present

## 2022-10-23 DIAGNOSIS — Z9641 Presence of insulin pump (external) (internal): Secondary | ICD-10-CM | POA: Diagnosis not present

## 2022-10-23 DIAGNOSIS — Z794 Long term (current) use of insulin: Secondary | ICD-10-CM | POA: Diagnosis not present

## 2022-10-23 DIAGNOSIS — I890 Lymphedema, not elsewhere classified: Secondary | ICD-10-CM | POA: Diagnosis not present

## 2022-10-23 DIAGNOSIS — Z905 Acquired absence of kidney: Secondary | ICD-10-CM | POA: Diagnosis not present

## 2022-10-23 DIAGNOSIS — E1143 Type 2 diabetes mellitus with diabetic autonomic (poly)neuropathy: Secondary | ICD-10-CM | POA: Diagnosis not present

## 2022-10-23 DIAGNOSIS — Z87891 Personal history of nicotine dependence: Secondary | ICD-10-CM | POA: Diagnosis not present

## 2022-10-23 DIAGNOSIS — E11319 Type 2 diabetes mellitus with unspecified diabetic retinopathy without macular edema: Secondary | ICD-10-CM | POA: Diagnosis not present

## 2022-10-23 DIAGNOSIS — K3184 Gastroparesis: Secondary | ICD-10-CM | POA: Diagnosis not present

## 2022-10-23 DIAGNOSIS — I110 Hypertensive heart disease with heart failure: Secondary | ICD-10-CM | POA: Diagnosis not present

## 2022-10-23 DIAGNOSIS — E1142 Type 2 diabetes mellitus with diabetic polyneuropathy: Secondary | ICD-10-CM | POA: Diagnosis not present

## 2022-10-27 ENCOUNTER — Encounter (INDEPENDENT_AMBULATORY_CARE_PROVIDER_SITE_OTHER): Payer: Medicare Other

## 2022-10-29 ENCOUNTER — Telehealth: Payer: Self-pay | Admitting: Internal Medicine

## 2022-10-29 DIAGNOSIS — I872 Venous insufficiency (chronic) (peripheral): Secondary | ICD-10-CM | POA: Diagnosis not present

## 2022-10-29 DIAGNOSIS — L03116 Cellulitis of left lower limb: Secondary | ICD-10-CM | POA: Diagnosis not present

## 2022-10-29 DIAGNOSIS — L03115 Cellulitis of right lower limb: Secondary | ICD-10-CM | POA: Diagnosis not present

## 2022-10-29 DIAGNOSIS — L97812 Non-pressure chronic ulcer of other part of right lower leg with fat layer exposed: Secondary | ICD-10-CM | POA: Diagnosis not present

## 2022-10-29 DIAGNOSIS — I890 Lymphedema, not elsewhere classified: Secondary | ICD-10-CM | POA: Diagnosis not present

## 2022-10-29 DIAGNOSIS — E1151 Type 2 diabetes mellitus with diabetic peripheral angiopathy without gangrene: Secondary | ICD-10-CM | POA: Diagnosis not present

## 2022-10-29 NOTE — Telephone Encounter (Signed)
Called Gary [name] to discuss.  States he was up walking, putting his dog up, etc when the nurse came to the door.  He walked to the door and sat down on his walker.  Vitals taken at that time.  He has noticed some increased sob over the last couple of weeks.  Was sick previously.  Feels better from the sickness, but left with increased fatigue (decreased energy).  Since sick, has noticed more sob with exertion.  Blood pressure currently 140s/80s.  Still with increased pulse rate.  Denies any notice of increased heart rate or palpitations.  Does report the increased sob with exertion.  Discussed need for evaluation.  Declines.  Discussed earlier appt.  Declines.  Will be evaluated if symptoms change.

## 2022-10-29 NOTE — Telephone Encounter (Signed)
Gary [name] from [facility] home [facility] called stating pt pulse is 122, BP is 98/68 and pt is having shortness of breath, but he is doing ok

## 2022-10-29 NOTE — Telephone Encounter (Signed)
Called pt to confirm ok. [facility] nurse reported that BP 98/68, pulse 122. Patient stated that the vitals written down from today are: 98/68, 122 pulse, O2 97%, temp 97.6. Patient states that he was sick a couple of weeks ago and is getting better slowly but is still tired. [facility] nurse reported patient being SOB. PT denies SOB while sitting or resting but did state he is SOB with exertion. Patient denies any other symptoms. Has appt scheduled for 2/2 with [name]. Offered to move appt up. Pt declined and stated that he did not feel that was necessary. Advised patient if he worsens or develops any new symptoms- will need to be evaluated. Pt gave verbal understanding.

## 2022-11-02 ENCOUNTER — Telehealth (INDEPENDENT_AMBULATORY_CARE_PROVIDER_SITE_OTHER): Payer: Self-pay

## 2022-11-02 NOTE — Telephone Encounter (Signed)
I would advise them to contact PCP and or cardiologist d/t the elevated HR

## 2022-11-02 NOTE — Telephone Encounter (Signed)
Kathlee Nations with [facility] home [facility] called with some vital concerns on patient.  Pulse was 122 normally 70-80 and blood pressure was 98/68.  Per there protocol they have to report this.  Please advise.

## 2022-11-02 NOTE — Telephone Encounter (Signed)
Spoke with Kathlee Nations and advised they should follow up with cardiology or PCP.

## 2022-11-03 ENCOUNTER — Encounter (INDEPENDENT_AMBULATORY_CARE_PROVIDER_SITE_OTHER): Payer: Medicare Other

## 2022-11-04 MED FILL — Dexamethasone Sodium Phosphate Inj 100 MG/10ML: INTRAMUSCULAR | Qty: 1 | Status: AC

## 2022-11-05 ENCOUNTER — Inpatient Hospital Stay: Payer: Medicare Other

## 2022-11-05 ENCOUNTER — Inpatient Hospital Stay: Payer: Medicare Other | Attending: Oncology

## 2022-11-05 ENCOUNTER — Encounter: Payer: Self-pay | Admitting: Oncology

## 2022-11-05 ENCOUNTER — Inpatient Hospital Stay (HOSPITAL_BASED_OUTPATIENT_CLINIC_OR_DEPARTMENT_OTHER): Payer: Medicare Other | Admitting: Oncology

## 2022-11-05 VITALS — BP 103/55 | HR 100

## 2022-11-05 DIAGNOSIS — D649 Anemia, unspecified: Secondary | ICD-10-CM | POA: Diagnosis not present

## 2022-11-05 DIAGNOSIS — Z950 Presence of cardiac pacemaker: Secondary | ICD-10-CM | POA: Diagnosis not present

## 2022-11-05 DIAGNOSIS — C8315 Mantle cell lymphoma, lymph nodes of inguinal region and lower limb: Secondary | ICD-10-CM | POA: Insufficient documentation

## 2022-11-05 DIAGNOSIS — Z803 Family history of malignant neoplasm of breast: Secondary | ICD-10-CM | POA: Diagnosis not present

## 2022-11-05 DIAGNOSIS — Z5111 Encounter for antineoplastic chemotherapy: Secondary | ICD-10-CM | POA: Insufficient documentation

## 2022-11-05 DIAGNOSIS — N289 Disorder of kidney and ureter, unspecified: Secondary | ICD-10-CM | POA: Diagnosis not present

## 2022-11-05 DIAGNOSIS — C8318 Mantle cell lymphoma, lymph nodes of multiple sites: Secondary | ICD-10-CM | POA: Diagnosis not present

## 2022-11-05 DIAGNOSIS — Z5112 Encounter for antineoplastic immunotherapy: Secondary | ICD-10-CM | POA: Insufficient documentation

## 2022-11-05 DIAGNOSIS — E1142 Type 2 diabetes mellitus with diabetic polyneuropathy: Secondary | ICD-10-CM | POA: Insufficient documentation

## 2022-11-05 DIAGNOSIS — R001 Bradycardia, unspecified: Secondary | ICD-10-CM | POA: Diagnosis not present

## 2022-11-05 DIAGNOSIS — Z87891 Personal history of nicotine dependence: Secondary | ICD-10-CM | POA: Insufficient documentation

## 2022-11-05 DIAGNOSIS — I110 Hypertensive heart disease with heart failure: Secondary | ICD-10-CM | POA: Insufficient documentation

## 2022-11-05 DIAGNOSIS — Z807 Family history of other malignant neoplasms of lymphoid, hematopoietic and related tissues: Secondary | ICD-10-CM | POA: Diagnosis not present

## 2022-11-05 DIAGNOSIS — I509 Heart failure, unspecified: Secondary | ICD-10-CM | POA: Diagnosis not present

## 2022-11-05 DIAGNOSIS — D696 Thrombocytopenia, unspecified: Secondary | ICD-10-CM | POA: Diagnosis not present

## 2022-11-05 LAB — COMPREHENSIVE METABOLIC PANEL
ALT: 10 U/L (ref 0–44)
AST: 25 U/L (ref 15–41)
Albumin: 3.3 g/dL — ABNORMAL LOW (ref 3.5–5.0)
Alkaline Phosphatase: 101 U/L (ref 38–126)
Anion gap: 13 (ref 5–15)
BUN: 28 mg/dL — ABNORMAL HIGH (ref 8–23)
CO2: 18 mmol/L — ABNORMAL LOW (ref 22–32)
Calcium: 8.1 mg/dL — ABNORMAL LOW (ref 8.9–10.3)
Chloride: 104 mmol/L (ref 98–111)
Creatinine, Ser: 2.09 mg/dL — ABNORMAL HIGH (ref 0.61–1.24)
GFR, Estimated: 33 mL/min — ABNORMAL LOW (ref >60)
Glucose, Bld: 105 mg/dL — ABNORMAL HIGH (ref 70–99)
Potassium: 3.7 mmol/L (ref 3.5–5.1)
Sodium: 135 mmol/L (ref 135–145)
Total Bilirubin: 0.3 mg/dL (ref 0.3–1.2)
Total Protein: 6.6 g/dL (ref 6.5–8.1)

## 2022-11-05 LAB — CBC WITH DIFFERENTIAL/PLATELET
Abs Immature Granulocytes: 0.05 10*3/uL (ref 0.00–0.07)
Basophils Absolute: 0.1 10*3/uL (ref 0.0–0.1)
Basophils Relative: 1 %
Eosinophils Absolute: 0.1 10*3/uL (ref 0.0–0.5)
Eosinophils Relative: 2 %
HCT: 34.1 % — ABNORMAL LOW (ref 39.0–52.0)
Hemoglobin: 11.2 g/dL — ABNORMAL LOW (ref 13.0–17.0)
Immature Granulocytes: 1 %
Lymphocytes Relative: 50 %
Lymphs Abs: 3.1 10*3/uL (ref 0.7–4.0)
MCH: 31.7 pg (ref 26.0–34.0)
MCHC: 32.8 g/dL (ref 30.0–36.0)
MCV: 96.6 fL (ref 80.0–100.0)
Monocytes Absolute: 0.7 10*3/uL (ref 0.1–1.0)
Monocytes Relative: 12 %
Neutro Abs: 2.1 10*3/uL (ref 1.7–7.7)
Neutrophils Relative %: 34 %
Platelets: 91 10*3/uL — ABNORMAL LOW (ref 150–400)
RBC: 3.53 MIL/uL — ABNORMAL LOW (ref 4.22–5.81)
RDW: 16 % — ABNORMAL HIGH (ref 11.5–15.5)
WBC: 6.1 10*3/uL (ref 4.0–10.5)
nRBC: 0 % (ref 0.0–0.2)

## 2022-11-05 MED ORDER — DIPHENHYDRAMINE HCL 50 MG/ML IJ SOLN
12.5000 mg | Freq: Once | INTRAMUSCULAR | Status: AC
  Administered 2022-11-05: 12.5 mg via INTRAVENOUS
  Filled 2022-11-05: qty 1

## 2022-11-05 MED ORDER — ACETAMINOPHEN 325 MG PO TABS
650.0000 mg | ORAL_TABLET | Freq: Once | ORAL | Status: AC
  Administered 2022-11-05: 650 mg via ORAL
  Filled 2022-11-05: qty 2

## 2022-11-05 MED ORDER — SODIUM CHLORIDE 0.9 % IV SOLN
160.0000 mg | Freq: Once | INTRAVENOUS | Status: AC
  Administered 2022-11-05: 160 mg via INTRAVENOUS
  Filled 2022-11-05: qty 6.4

## 2022-11-05 MED ORDER — HEPARIN SOD (PORK) LOCK FLUSH 100 UNIT/ML IV SOLN
500.0000 [IU] | Freq: Once | INTRAVENOUS | Status: AC | PRN
  Administered 2022-11-05: 500 [IU]
  Filled 2022-11-05: qty 5

## 2022-11-05 MED ORDER — SODIUM CHLORIDE 0.9 % IV SOLN
Freq: Once | INTRAVENOUS | Status: AC
  Filled 2022-11-05: qty 250

## 2022-11-05 MED ORDER — PALONOSETRON HCL INJECTION 0.25 MG/5ML
0.2500 mg | Freq: Once | INTRAVENOUS | Status: AC
  Administered 2022-11-05: 0.25 mg via INTRAVENOUS
  Filled 2022-11-05: qty 5

## 2022-11-05 MED ORDER — SODIUM CHLORIDE 0.9 % IV SOLN
10.0000 mg | Freq: Once | INTRAVENOUS | Status: AC
  Administered 2022-11-05: 10 mg via INTRAVENOUS
  Filled 2022-11-05: qty 10

## 2022-11-05 MED ORDER — SODIUM CHLORIDE 0.9 % IV SOLN
375.0000 mg/m2 | Freq: Once | INTRAVENOUS | Status: AC
  Administered 2022-11-05: 900 mg via INTRAVENOUS
  Filled 2022-11-05: qty 90

## 2022-11-05 MED FILL — Dexamethasone Sodium Phosphate Inj 100 MG/10ML: INTRAMUSCULAR | Qty: 1 | Status: AC

## 2022-11-05 NOTE — Progress Notes (Signed)
Per MD ok to proceed with chemo HR 110

## 2022-11-05 NOTE — Progress Notes (Signed)
[place]  Telephone:([phone]) [phone] [phone]) [phone]  ID: [name] OB: 18-Jul-1946  MR#: [id number]  JJK#:[id number]  Patient Care Team: [name], MD as PCP - General (Internal Medicine) Lloyd Huger, MD as Consulting Physician (Oncology) Lucky Cowboy Erskine Squibb, MD as Referring Physician (Vascular Surgery) Schnier, Dolores Lory, MD (Vascular Surgery) Corey Skains, MD as Consulting Physician (Cardiology)  CHIEF COMPLAINT: Recurrent Mantle cell lymphoma.  INTERVAL HISTORY: Patient returns to clinic today for further evaluation and consideration of cycle 3 of Rituxan and Treanda.  He had a viral illness several weeks ago and has been slow to recover, but otherwise feels well.  He does not complain of any weakness or fatigue.  He denies any fevers, chills, or night sweats.  He has no neurologic complaints.  He has dyspnea on exertion, but denies any chest pain, shortness of breath, cough, or hemoptysis.  He has no nausea, vomiting, constipation, or diarrhea.  He denies any melena or hematochezia.  He has no urinary complaints.  Patient offers no further specific complaints today.  Review of Systems  Constitutional: Negative.  Negative for diaphoresis, fever, malaise/fatigue and weight loss.  Respiratory: Negative.  Negative for cough, hemoptysis and shortness of breath.   Cardiovascular: Negative.  Negative for chest pain and leg swelling.  Gastrointestinal: Negative.  Negative for abdominal pain.  Genitourinary: Negative.  Negative for dysuria.  Musculoskeletal: Negative.  Negative for back pain.  Skin: Negative.  Negative for rash.  Neurological: Negative.  Negative for dizziness, focal weakness, weakness and headaches.  Psychiatric/Behavioral: Negative.  The patient is not nervous/anxious.     As per HPI. Otherwise, a complete review of systems is negative.  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    Gastroparesis    Graves disease    Hypercholesterolemia    Hypertension    Mantle cell lymphoma ([place]) 07/2014   Peripheral neuropathy    Shingles     PAST SURGICAL HISTORY: Past Surgical History:  Procedure Laterality Date   CHOLECYSTECTOMY     NEPHRECTOMY     right   PACEMAKER INSERTION N/A 12/07/2017   Procedure: INSERTION PACEMAKER DUAL CHAMBER INITIAL INSERT;  Surgeon: Isaias Cowman, MD;  Location: [facility] ORS;  Service: Cardiovascular;  Laterality: N/A;   PORTA CATH INSERTION N/A 11/03/2017   Procedure: PORTA CATH INSERTION;  Surgeon: Katha Cabal, MD;  Location: [facility];  Service: Cardiovascular;  Laterality: N/A;    FAMILY HISTORY Family History  Problem Relation Age of Onset   Breast cancer Mother    Diabetes Father    Cancer Father        Lymphoma   Alcohol abuse Maternal Uncle    Diabetes Sister    Heart disease Other        grandfather       ADVANCED DIRECTIVES:    HEALTH MAINTENANCE: Social History   Tobacco Use   Smoking status: Former    Types: Cigarettes    Passive exposure: Past   Smokeless tobacco: Current    Types: Chew  Vaping Use   Vaping Use: Never used  Substance Use Topics   Alcohol use: Yes    Alcohol/week: 3.0 standard drinks of alcohol    Types: 3 Cans of beer per week    Comment: nightly   Drug use: Never  Colonoscopy:  PAP:  Bone density:  Lipid panel:  Allergies  Allergen Reactions   Rofecoxib Nausea Only    Current Outpatient Medications  Medication Sig Dispense Refill   acyclovir (ZOVIRAX) 400 MG tablet Take 1 tablet (400 mg total) by mouth daily. 30 tablet 3   bacitracin 500 UNIT/GM ointment Apply 1 application topically daily.     Cholecalciferol (VITAMIN D3) 50 MCG (2000 UT) CAPS Take 2,000 Units by mouth daily.     ferrous sulfate 325 (65 FE) MG tablet Take 325 mg by  mouth daily with breakfast.     furosemide (LASIX) 40 MG tablet Take 2 tablets in the morning, and 1 tablet in the evening. 90 tablet 6   Insulin Human (INSULIN PUMP) SOLN Inject into the skin.     levothyroxine (SYNTHROID) 25 MCG tablet Take 1 tablet (25 mcg total) by mouth daily before breakfast. 30 tablet 1   lidocaine-prilocaine (EMLA) cream Apply to affected area once 30 g 3   nystatin cream (MYCOSTATIN) APPLY TO AFFECTED AREA TWICE A DAY 30 g 1   ondansetron (ZOFRAN) 8 MG tablet Take 1 tablet (8 mg total) by mouth every 8 (eight) hours as needed for nausea or vomiting. Start on the third day after chemotherapy. 60 tablet 2   potassium chloride (KLOR-CON) 10 MEQ tablet TAKE 1 TABLET BY MOUTH 2 TIMES DAILY. 90 tablet 1   pravastatin (PRAVACHOL) 40 MG tablet TAKE 1 TABLET BY MOUTH EVERY DAY 90 tablet 1   prochlorperazine (COMPAZINE) 10 MG tablet TAKE 1 TABLET BY MOUTH EVERY 6 HOURS AS NEEDED FOR NAUSEA OR VOMITING. 60 tablet 2   vitamin B-12 (CYANOCOBALAMIN) 1000 MCG tablet Take 1,000 mcg by mouth daily.     feeding supplement, GLUCERNA SHAKE, (GLUCERNA SHAKE) LIQD Take 237 mLs by mouth 2 (two) times daily between meals. (Patient not taking: Reported on 09/23/2022) 237 mL 0   glucose 4 GM chewable tablet Chew 1 tablet (4 g total) by mouth once as needed for low blood sugar. (Patient not taking: Reported on 09/23/2022) 50 tablet 0   insulin aspart (NOVOLOG) 100 UNIT/ML injection Inject 6 Units into the skin 3 (three) times daily with meals. (Patient not taking: Reported on 09/14/2022)     levothyroxine (SYNTHROID) 25 MCG tablet Take 25 mcg by mouth daily before breakfast. (Patient not taking: Reported on 09/23/2022)     No current facility-administered medications for this visit.   Facility-Administered Medications Ordered in Other Visits  Medication Dose Route Frequency Provider Last Rate Last Admin   heparin lock flush 100 unit/mL  500 Units Intravenous Once Lloyd Huger, MD        sodium chloride flush (NS) 0.9 % injection 10 mL  10 mL Intravenous PRN Lloyd Huger, MD   10 mL at 02/01/18 0829   sodium chloride flush (NS) 0.9 % injection 10 mL  10 mL Intravenous PRN Lloyd Huger, MD   10 mL at 04/05/18 0800    OBJECTIVE: Vitals:   11/05/22 0849  BP: (!) 114/59  Pulse: (!) 119  Resp: 20  Temp: (!) 97.5 F (36.4 C)  SpO2: 99%     Body mass index is 40.94 kg/m.    ECOG FS:0 - Asymptomatic  General: Well-developed, well-nourished, no acute distress. Eyes: Pink conjunctiva, anicteric sclera. HEENT: Normocephalic, moist mucous membranes. Lungs: No audible wheezing or coughing. Heart: Regular rate and rhythm. Abdomen: Soft, nontender, no obvious distention. Musculoskeletal: No edema, cyanosis, or clubbing. Neuro: Alert, answering all questions  appropriately. Cranial nerves grossly intact. Skin: No rashes or petechiae noted. Psych: Normal affect.  LAB RESULTS:  Lab Results  Component Value Date   NA 135 11/05/2022   K 3.7 11/05/2022   CL 104 11/05/2022   CO2 18 (L) 11/05/2022   GLUCOSE 105 (H) 11/05/2022   BUN 28 (H) 11/05/2022   CREATININE 2.09 (H) 11/05/2022   CALCIUM 8.1 (L) 11/05/2022   PROT 6.6 11/05/2022   ALBUMIN 3.3 (L) 11/05/2022   AST 25 11/05/2022   ALT 10 11/05/2022   ALKPHOS 101 11/05/2022   BILITOT 0.3 11/05/2022   GFRNONAA 33 (L) 11/05/2022   GFRAA 41 (L) 01/24/2020    Lab Results  Component Value Date   WBC 6.1 11/05/2022   NEUTROABS 2.1 11/05/2022   HGB 11.2 (L) 11/05/2022   HCT 34.1 (L) 11/05/2022   MCV 96.6 11/05/2022   PLT 91 (L) 11/05/2022   Lab Results  Component Value Date   IRON 13 (L) 08/11/2017   TIBC 223 (L) 01/12/2017   IRONPCTSAT 3.8 (L) 08/11/2017   Lab Results  Component Value Date   FERRITIN 42.0 08/11/2017     STUDIES:   ASSESSMENT: Recurrent low-grade mantle cell lymphoma. PLAN:    1. Mantle cell lymphoma, stage III: Patient's initial diagnosis was on inguinal lymph node biopsy  on July 12, 2014.  Patient was noted to have progressive disease and underwent chemotherapy using Rituxan and Treanda completing cycle 4 on Mar 09, 2018.  His most recent CT scan on August 07, 2022 reviewed independently with clearly progressive disease with increasing lymphadenopathy.  PET scan results from August 24, 2022 confirmed recurrent disease.  Since patient is over 4 years removed from his last treatment, can repeat chemotherapy using Treanda and Rituxan.  Plan to do 4-6 cycles every 4 weeks.  Given patient's persistent thrombocytopenia, will dose reduced Treanda.  Proceed with cycle 3, day 1 of treatment today.  Return to clinic tomorrow for [place] only and then in 4 weeks for further evaluation and consideration of cycle 4, day 1.  Will repeat PET scan at the conclusion of cycle 4.   2. Decreased ejection fraction: Improved.  Patient's most recent cardiac echo in November 2020 revealed an ejection fraction estimated to be 50 to 55%. Continue monitoring and evaluation per cardiology. 3.  Hyperglycemia: Patient has improved blood glucose control.  Continue insulin pump as directed. 4. Thrombocytopenia: Chronic and unchanged.  Dose reduced Treanda as above. 5.  Bradycardia: Patient has pacemaker in place. 6.  Renal insufficiency: Creatinine is just an up to 2.09.  Neither Treanda or Rituxan need to be dose reduced with renal insufficiency. 7.  Nausea: Patient has been instructed to use his antiemetics as prescribed. 8.  Anemia: Mild, monitor.  Patient expressed understanding and was in agreement with this plan. He also understands that He can call clinic at any time with any questions, concerns, or complaints.    Lloyd Huger, MD   11/05/2022 9:27 AM

## 2022-11-05 NOTE — Patient Instructions (Signed)
[facility]  Discharge Instructions: Thank you for choosing [facility] to provide your oncology and hematology care.  If you have a lab appointment with the [facility], please go directly to the [facility] and check in at the registration area.  Wear comfortable clothing and clothing appropriate for easy access to any Portacath or PICC line.   We strive to give you quality time with your provider. You may need to reschedule your appointment if you arrive late (15 or more minutes).  Arriving late affects you and other patients whose appointments are after yours.  Also, if you miss three or more appointments without notifying the office, you may be dismissed from the clinic at the provider's discretion.      For prescription refill requests, have your pharmacy contact our office and allow 72 hours for refills to be completed.    Today you received the following chemotherapy and/or immunotherapy agents: bendamustine/ riTUXimab-abbs   To help prevent nausea and vomiting after your treatment, we encourage you to take your nausea medication as directed.  BELOW ARE SYMPTOMS THAT SHOULD BE REPORTED IMMEDIATELY: *FEVER GREATER THAN 100.4 F (38 C) OR HIGHER *CHILLS OR SWEATING *NAUSEA AND VOMITING THAT IS NOT CONTROLLED WITH YOUR NAUSEA MEDICATION *UNUSUAL SHORTNESS OF BREATH *UNUSUAL BRUISING OR BLEEDING *URINARY PROBLEMS (pain or burning when urinating, or frequent urination) *BOWEL PROBLEMS (unusual diarrhea, constipation, pain near the anus) TENDERNESS IN MOUTH AND THROAT WITH OR WITHOUT PRESENCE OF ULCERS (sore throat, sores in mouth, or a toothache) UNUSUAL RASH, SWELLING OR PAIN  UNUSUAL VAGINAL DISCHARGE OR ITCHING   Items with * indicate a potential emergency and should be followed up as soon as possible or go to the Emergency Department if any problems should occur.  Please show the CHEMOTHERAPY ALERT CARD or IMMUNOTHERAPY ALERT  CARD at check-in to the Emergency Department and triage nurse.  Should you have questions after your visit or need to cancel or reschedule your appointment, please contact [facility]  [phone] and follow the prompts.  Office hours are 8:00 a.m. to 4:30 p.m. Monday - Friday. Please note that voicemails left after 4:00 p.m. may not be returned until the following business day.  We are closed weekends and major holidays. You have access to a nurse at all times for urgent questions. Please call the main number to the clinic [phone] and follow the prompts.  For any non-urgent questions, you may also contact your provider using MyChart. We now offer e-Visits for anyone 13 and older to request care online for non-urgent symptoms. For details visit [email or web address].   Also download the MyChart app! Go to the app store, search "MyChart", open the app, select [facility], and log in with your MyChart username and password.

## 2022-11-06 ENCOUNTER — Inpatient Hospital Stay: Payer: Medicare Other

## 2022-11-06 VITALS — BP 135/55 | HR 66 | Temp 99.5°F | Resp 20

## 2022-11-06 DIAGNOSIS — D696 Thrombocytopenia, unspecified: Secondary | ICD-10-CM | POA: Diagnosis not present

## 2022-11-06 DIAGNOSIS — D649 Anemia, unspecified: Secondary | ICD-10-CM | POA: Diagnosis not present

## 2022-11-06 DIAGNOSIS — C8318 Mantle cell lymphoma, lymph nodes of multiple sites: Secondary | ICD-10-CM

## 2022-11-06 DIAGNOSIS — Z5112 Encounter for antineoplastic immunotherapy: Secondary | ICD-10-CM | POA: Diagnosis not present

## 2022-11-06 DIAGNOSIS — Z5111 Encounter for antineoplastic chemotherapy: Secondary | ICD-10-CM | POA: Diagnosis not present

## 2022-11-06 DIAGNOSIS — C8315 Mantle cell lymphoma, lymph nodes of inguinal region and lower limb: Secondary | ICD-10-CM | POA: Diagnosis not present

## 2022-11-06 DIAGNOSIS — R001 Bradycardia, unspecified: Secondary | ICD-10-CM | POA: Diagnosis not present

## 2022-11-06 MED ORDER — SODIUM CHLORIDE 0.9 % IV SOLN
160.0000 mg | Freq: Once | INTRAVENOUS | Status: AC
  Administered 2022-11-06: 160 mg via INTRAVENOUS
  Filled 2022-11-06: qty 6.4

## 2022-11-06 MED ORDER — HEPARIN SOD (PORK) LOCK FLUSH 100 UNIT/ML IV SOLN
500.0000 [IU] | Freq: Once | INTRAVENOUS | Status: AC | PRN
  Administered 2022-11-06: 500 [IU]
  Filled 2022-11-06: qty 5

## 2022-11-06 MED ORDER — SODIUM CHLORIDE 0.9 % IV SOLN
Freq: Once | INTRAVENOUS | Status: AC
  Filled 2022-11-06: qty 250

## 2022-11-06 MED ORDER — SODIUM CHLORIDE 0.9 % IV SOLN
10.0000 mg | Freq: Once | INTRAVENOUS | Status: AC
  Administered 2022-11-06: 10 mg via INTRAVENOUS
  Filled 2022-11-06: qty 10

## 2022-11-06 MED ORDER — SODIUM CHLORIDE 0.9% FLUSH
10.0000 mL | INTRAVENOUS | Status: DC | PRN
  Administered 2022-11-06: 10 mL
  Filled 2022-11-06: qty 10

## 2022-11-06 NOTE — Patient Instructions (Signed)
[facility]  Discharge Instructions: Thank you for choosing [facility] to provide your oncology and hematology care.  If you have a lab appointment with the [facility], please go directly to the [facility] and check in at the registration area.  Wear comfortable clothing and clothing appropriate for easy access to any Portacath or PICC line.   We strive to give you quality time with your provider. You may need to reschedule your appointment if you arrive late (15 or more minutes).  Arriving late affects you and other patients whose appointments are after yours.  Also, if you miss three or more appointments without notifying the office, you may be dismissed from the clinic at the provider's discretion.      For prescription refill requests, have your pharmacy contact our office and allow 72 hours for refills to be completed.    Today you received the following chemotherapy and/or immunotherapy agents- bendamustine      To help prevent nausea and vomiting after your treatment, we encourage you to take your nausea medication as directed.  BELOW ARE SYMPTOMS THAT SHOULD BE REPORTED IMMEDIATELY: *FEVER GREATER THAN 100.4 F (38 C) OR HIGHER *CHILLS OR SWEATING *NAUSEA AND VOMITING THAT IS NOT CONTROLLED WITH YOUR NAUSEA MEDICATION *UNUSUAL SHORTNESS OF BREATH *UNUSUAL BRUISING OR BLEEDING *URINARY PROBLEMS (pain or burning when urinating, or frequent urination) *BOWEL PROBLEMS (unusual diarrhea, constipation, pain near the anus) TENDERNESS IN MOUTH AND THROAT WITH OR WITHOUT PRESENCE OF ULCERS (sore throat, sores in mouth, or a toothache) UNUSUAL RASH, SWELLING OR PAIN  UNUSUAL VAGINAL DISCHARGE OR ITCHING   Items with * indicate a potential emergency and should be followed up as soon as possible or go to the Emergency Department if any problems should occur.  Please show the CHEMOTHERAPY ALERT CARD or IMMUNOTHERAPY ALERT CARD at check-in  to the Emergency Department and triage nurse.  Should you have questions after your visit or need to cancel or reschedule your appointment, please contact [facility]  [phone] and follow the prompts.  Office hours are 8:00 a.m. to 4:30 p.m. Monday - Friday. Please note that voicemails left after 4:00 p.m. may not be returned until the following business day.  We are closed weekends and major holidays. You have access to a nurse at all times for urgent questions. Please call the main number to the clinic [phone] and follow the prompts.  For any non-urgent questions, you may also contact your provider using MyChart. We now offer e-Visits for anyone 58 and older to request care online for non-urgent symptoms. For details visit [email or web address].   Also download the MyChart app! Go to the app store, search "MyChart", open the app, select [facility], and log in with your MyChart username and password.

## 2022-11-10 ENCOUNTER — Encounter (INDEPENDENT_AMBULATORY_CARE_PROVIDER_SITE_OTHER): Payer: Medicare Other

## 2022-11-12 ENCOUNTER — Telehealth: Payer: Self-pay | Admitting: Internal Medicine

## 2022-11-12 DIAGNOSIS — L03115 Cellulitis of right lower limb: Secondary | ICD-10-CM | POA: Diagnosis not present

## 2022-11-12 DIAGNOSIS — E1151 Type 2 diabetes mellitus with diabetic peripheral angiopathy without gangrene: Secondary | ICD-10-CM | POA: Diagnosis not present

## 2022-11-12 DIAGNOSIS — I872 Venous insufficiency (chronic) (peripheral): Secondary | ICD-10-CM | POA: Diagnosis not present

## 2022-11-12 DIAGNOSIS — I890 Lymphedema, not elsewhere classified: Secondary | ICD-10-CM | POA: Diagnosis not present

## 2022-11-12 DIAGNOSIS — L03116 Cellulitis of left lower limb: Secondary | ICD-10-CM | POA: Diagnosis not present

## 2022-11-12 DIAGNOSIS — L97812 Non-pressure chronic ulcer of other part of right lower leg with fat layer exposed: Secondary | ICD-10-CM | POA: Diagnosis not present

## 2022-11-12 NOTE — Telephone Encounter (Signed)
Pt sister(susan) called in staying that she would like to speck to Dr. Nicki Reaper concerning his brother health, she's available '@[phone] or '@[phone]'$ [phone].

## 2022-11-13 ENCOUNTER — Encounter: Payer: Self-pay | Admitting: Internal Medicine

## 2022-11-13 ENCOUNTER — Ambulatory Visit (INDEPENDENT_AMBULATORY_CARE_PROVIDER_SITE_OTHER): Payer: Medicare Other

## 2022-11-13 ENCOUNTER — Ambulatory Visit: Payer: Medicare Other | Admitting: Internal Medicine

## 2022-11-13 VITALS — BP 114/60 | HR 88 | Temp 97.7°F | Resp 19 | Ht 65.0 in | Wt 239.8 lb

## 2022-11-13 DIAGNOSIS — R059 Cough, unspecified: Secondary | ICD-10-CM

## 2022-11-13 DIAGNOSIS — S61419A Laceration without foreign body of unspecified hand, initial encounter: Secondary | ICD-10-CM | POA: Diagnosis not present

## 2022-11-13 DIAGNOSIS — I429 Cardiomyopathy, unspecified: Secondary | ICD-10-CM | POA: Diagnosis not present

## 2022-11-13 DIAGNOSIS — I495 Sick sinus syndrome: Secondary | ICD-10-CM

## 2022-11-13 DIAGNOSIS — E7800 Pure hypercholesterolemia, unspecified: Secondary | ICD-10-CM

## 2022-11-13 DIAGNOSIS — K3184 Gastroparesis: Secondary | ICD-10-CM | POA: Diagnosis not present

## 2022-11-13 DIAGNOSIS — I700 Atherosclerosis of aorta: Secondary | ICD-10-CM

## 2022-11-13 DIAGNOSIS — F320 Major depressive disorder, single episode, mild: Secondary | ICD-10-CM

## 2022-11-13 DIAGNOSIS — I5022 Chronic systolic (congestive) heart failure: Secondary | ICD-10-CM

## 2022-11-13 DIAGNOSIS — E1029 Type 1 diabetes mellitus with other diabetic kidney complication: Secondary | ICD-10-CM

## 2022-11-13 DIAGNOSIS — I10 Essential (primary) hypertension: Secondary | ICD-10-CM

## 2022-11-13 DIAGNOSIS — D649 Anemia, unspecified: Secondary | ICD-10-CM | POA: Diagnosis not present

## 2022-11-13 DIAGNOSIS — R809 Proteinuria, unspecified: Secondary | ICD-10-CM

## 2022-11-13 DIAGNOSIS — J439 Emphysema, unspecified: Secondary | ICD-10-CM | POA: Diagnosis not present

## 2022-11-13 DIAGNOSIS — C8318 Mantle cell lymphoma, lymph nodes of multiple sites: Secondary | ICD-10-CM

## 2022-11-13 DIAGNOSIS — I2510 Atherosclerotic heart disease of native coronary artery without angina pectoris: Secondary | ICD-10-CM

## 2022-11-13 DIAGNOSIS — N1832 Chronic kidney disease, stage 3b: Secondary | ICD-10-CM | POA: Diagnosis not present

## 2022-11-13 DIAGNOSIS — I872 Venous insufficiency (chronic) (peripheral): Secondary | ICD-10-CM | POA: Diagnosis not present

## 2022-11-13 DIAGNOSIS — D696 Thrombocytopenia, unspecified: Secondary | ICD-10-CM

## 2022-11-13 MED ORDER — FLUOXETINE HCL 20 MG PO TABS: 20.0000 mg | ORAL_TABLET | Freq: Every day | ORAL | 2 refills | Status: DC

## 2022-11-13 NOTE — Progress Notes (Unsigned)
Subjective:    [name], male    DOB: 02-22-1946, 77 y.o.   MRN: [id number]  Patient here for  Chief Complaint  Patient presents with   Medical Management of Chronic Issues   Hypertension   Diabetes    HPI Here to follow up regarding his blood sugar, blood pressure, lower extremity swelling and is currently being treated for lymphoma.  Saw Dr [name] 11/05/22 - receiving rituxan and treanda. Planning for repeat PET scan after cycle 4.  Seeing Dr Honor Junes for f/u of his diabetes.  Has insulin pump.  Has done better overall with low sugars.  Did have respiratory illness a few weeks ago.  Previous diarrhea, but this has resolved.  Had two episodes.  Took otc medication and it resolved.  Food does not taste as well.  He is eating, but reports not eating as much.  Had a normal bowel movement today.  Some dysphagia.  Discussed further w/up.  He wants to hold at this time.  Discussed importance of taking small bites and eating slowly.  Does report chronic sob with exertion, but feels is gradually worsening.  Feels some worsening after his respiratory illness.  No increased cough.  Does report some depression.  Discussed.  No SI.  Is off prozac.  Would like to restart.     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 with macular edema    Gastroparesis    Graves disease    Hypercholesterolemia    Hypertension    Mantle cell lymphoma ([place]) 07/2014   Peripheral neuropathy    Shingles    Past Surgical History:  Procedure Laterality Date   CHOLECYSTECTOMY     NEPHRECTOMY     right   PACEMAKER INSERTION N/A 12/07/2017   Procedure: INSERTION PACEMAKER DUAL CHAMBER INITIAL INSERT;  Surgeon: Isaias Cowman, MD;  Location: [facility];  Service: Cardiovascular;  Laterality: N/A;   PORTA CATH INSERTION N/A 11/03/2017   Procedure: PORTA CATH INSERTION;   Surgeon: Katha Cabal, MD;  Location: [place] [facility];  Service: Cardiovascular;  Laterality: N/A;   Family History  Problem Relation Age of Onset   Breast cancer Mother    Diabetes Father    Cancer Father        Lymphoma   Alcohol abuse Maternal Uncle    Diabetes Sister    Heart disease Other        grandfather   Social History   Socioeconomic History   Marital status: Widowed    Spouse name: Not on file   Number of children: 1   Years of education: 83   Highest education level: 12th grade  Occupational History   Occupation: Retired  Tobacco Use   Smoking status: Former    Types: Cigarettes    Passive exposure: Past   Smokeless tobacco: Current    Types: Chew  Vaping Use   Vaping Use: Never used  Substance and Sexual Activity   Alcohol use: Yes    Alcohol/week: 3.0 standard drinks of alcohol    Types: 3 Cans of beer per week    Comment: nightly   Drug use: Never   Sexual activity: Not Currently  Other Topics Concern   Not on file  Social History Narrative   Not on file   Social Determinants of Health   Financial Resource Strain: Low  Risk  (03/10/2022)   Overall Financial Resource Strain (CARDIA)    Difficulty of Paying Living Expenses: Not hard at all  Food Insecurity: No Food Insecurity (03/10/2022)   Hunger Vital Sign    Worried About Running Out of Food in the Last Year: Never true    Ran Out of Food in the Last Year: Never true  Transportation Needs: Unmet Transportation Needs (11/05/2022)   PRAPARE - Transportation    Lack of Transportation (Medical): Yes    Lack of Transportation (Non-Medical): Yes  Physical Activity: Inactive (03/10/2022)   Exercise Vital Sign    Days of Exercise per Week: 0 days    Minutes of Exercise per Session: 0 min  Stress: No Stress Concern Present (03/10/2022)   [place]    Feeling of Stress : Only a little  Social Connections: Socially Isolated  (03/10/2022)   Social Connection and Isolation Panel [NHANES]    Frequency of Communication with Friends and Family: More than three times a week    Frequency of Social Gatherings with Friends and Family: Twice a week    Attends Religious Services: Never    Marine scientist or Organizations: No    Attends Archivist Meetings: Never    Marital Status: Widowed     Review of Systems  Constitutional:  Positive for appetite change and fatigue.  HENT:  Negative for congestion and sinus pressure.   Respiratory:  Positive for shortness of breath. Negative for cough and chest tightness.   Cardiovascular:  Negative for chest pain and palpitations.       Lower extremity swelling - improved - wraps in place.    Gastrointestinal:  Negative for abdominal pain and vomiting.       Previous diarrhea.  Resolved.    Genitourinary:  Negative for difficulty urinating and dysuria.  Musculoskeletal:  Negative for joint swelling and myalgias.  Skin:  Negative for color change and rash.       Skin tear - left hand.    Neurological:  Negative for dizziness and headaches.  Psychiatric/Behavioral:  Negative for agitation.        Some depression as outlined.        Objective:     BP 114/60   Pulse 88   Temp 97.7 F (36.5 C) (Temporal)   Resp 19   Ht '5\' 5"'$  (1.651 m)   Wt 239 lb 12.8 oz (108.8 kg)   SpO2 99%   BMI 39.90 kg/m  Wt Readings from Last 3 Encounters:  11/13/22 239 lb 12.8 oz (108.8 kg)  11/05/22 246 lb (111.6 kg)  10/20/22 245 lb (111.1 kg)    Physical Exam Vitals reviewed.  Constitutional:      General: He is not in acute distress.    Appearance: Normal appearance. He is well-developed.  HENT:     Head: Normocephalic and atraumatic.     Right Ear: External ear normal.     Left Ear: External ear normal.  Eyes:     General: No scleral icterus.       Right eye: No discharge.        Left eye: No discharge.     Conjunctiva/sclera: Conjunctivae normal.   Cardiovascular:     Rate and Rhythm: Normal rate and regular rhythm.  Pulmonary:     Effort: Pulmonary effort is normal. No respiratory distress.  Abdominal:     General: Bowel sounds are normal.  Palpations: Abdomen is soft.     Tenderness: There is no abdominal tenderness.  Musculoskeletal:        General: No tenderness.     Cervical back: Neck supple. No tenderness.     Comments: Lower extremity swelling - improved - legs wrapped.  Lymphadenopathy:     Cervical: No cervical adenopathy.  Skin:    Findings: No erythema or rash.     Comments: Skin tear - left hand - no evidence of infection.   Neurological:     Mental Status: He is alert.  Psychiatric:        Mood and Affect: Mood normal.        Behavior: Behavior normal.      Outpatient Encounter Medications as of 11/13/2022  Medication Sig   acyclovir (ZOVIRAX) 400 MG tablet Take 1 tablet (400 mg total) by mouth daily.   bacitracin 500 UNIT/GM ointment Apply 1 application topically daily.   Cholecalciferol (VITAMIN D3) 50 MCG (2000 UT) CAPS Take 2,000 Units by mouth daily.   feeding supplement, GLUCERNA SHAKE, (GLUCERNA SHAKE) LIQD Take 237 mLs by mouth 2 (two) times daily between meals.   ferrous sulfate 325 (65 FE) MG tablet Take 325 mg by mouth daily with breakfast.   FLUoxetine (PROZAC) 20 MG tablet Take 1 tablet (20 mg total) by mouth daily.   furosemide (LASIX) 40 MG tablet Take 2 tablets in the morning, and 1 tablet in the evening.   glucose 4 GM chewable tablet Chew 1 tablet (4 g total) by mouth once as needed for low blood sugar.   insulin aspart (NOVOLOG) 100 UNIT/ML injection Inject 6 Units into the skin 3 (three) times daily with meals.   Insulin Human (INSULIN PUMP) SOLN Inject into the skin.   levothyroxine (SYNTHROID) 25 MCG tablet Take 1 tablet (25 mcg total) by mouth daily before breakfast.   levothyroxine (SYNTHROID) 25 MCG tablet Take 25 mcg by mouth daily before breakfast.   lidocaine-prilocaine (EMLA)  cream Apply to affected area once   nystatin cream (MYCOSTATIN) APPLY TO AFFECTED AREA TWICE A DAY   ondansetron (ZOFRAN) 8 MG tablet Take 1 tablet (8 mg total) by mouth every 8 (eight) hours as needed for nausea or vomiting. Start on the third day after chemotherapy.   potassium chloride (KLOR-CON) 10 MEQ tablet TAKE 1 TABLET BY MOUTH 2 TIMES DAILY.   pravastatin (PRAVACHOL) 40 MG tablet TAKE 1 TABLET BY MOUTH EVERY DAY   prochlorperazine (COMPAZINE) 10 MG tablet TAKE 1 TABLET BY MOUTH EVERY 6 HOURS AS NEEDED FOR NAUSEA OR VOMITING.   vitamin B-12 (CYANOCOBALAMIN) 1000 MCG tablet Take 1,000 mcg by mouth daily.   Facility-Administered Encounter Medications as of 11/13/2022  Medication   heparin lock flush 100 unit/mL   sodium chloride flush (NS) 0.9 % injection 10 mL   sodium chloride flush (NS) 0.9 % injection 10 mL     Lab Results  Component Value Date   WBC 6.1 11/05/2022   HGB 11.2 (L) 11/05/2022   HCT 34.1 (L) 11/05/2022   PLT 91 (L) 11/05/2022   GLUCOSE 105 (H) 11/05/2022   CHOL 123 05/11/2022   TRIG 52.0 05/11/2022   HDL 52.90 05/11/2022   LDLCALC 60 05/11/2022   ALT 10 11/05/2022   AST 25 11/05/2022   NA 135 11/05/2022   K 3.7 11/05/2022   CL 104 11/05/2022   CREATININE 2.09 (H) 11/05/2022   BUN 28 (H) 11/05/2022   CO2 18 (L) 11/05/2022   TSH 2.85  05/11/2022   PSA 1.98 05/11/2022   INR 1.2 08/29/2021   HGBA1C 7.4 04/30/2022   MICROALBUR <0.7 05/11/2022    NM PET Image Restag (PS) Skull Base To Thigh  Result Date: 08/25/2022 CLINICAL DATA:  Initial treatment strategy for mantle cell lymphoma, suspect disease recurrence. EXAM: NUCLEAR MEDICINE PET SKULL BASE TO THIGH TECHNIQUE: 13.36 mCi F-18 FDG was injected intravenously. Full-ring PET imaging was performed from the skull base to thigh after the radiotracer. CT data was obtained and used for attenuation correction and anatomic localization. Fasting blood glucose: 121 mg/dl COMPARISON:  Chest abdomen and pelvis  evaluation from August 07, 2022. FINDINGS: Mediastinal blood pool activity: SUV max 1.77 Liver activity: SUV max 2.61 NECK: No hypermetabolic lymph nodes in the neck. Incidental CT findings: Physiologic muscular activity about the neck involving the larynx and posterior cervical musculature. CHEST: No hypermetabolic mediastinal or hilar nodes. No suspicious pulmonary nodules on the CT scan. Incidental CT findings: RIGHT-sided Port-A-Cath terminates at the caval to atrial junction. Multi lead cardiac pacer device in the RIGHT heart, power pack over the LEFT chest. Heart size moderately enlarged. Adenopathy in the retroperitoneum and bilateral pelvis, greatest on the LEFT as outlined in previous CT imaging. No adenopathy by size criteria. No consolidation or effusion. Signs of pulmonary emphysema. Airways are patent. ABDOMEN/PELVIS: Signs of recurrent disease in the abdomen and pelvis. Adenopathy in the bilateral retroperitoneum and LEFT greater than RIGHT pelvis. Adenopathy in the bilateral groin RIGHT greater than LEFT. Dominant LEFT para-aortic lymph node 1.7 cm (image 164/2), maximum SUV 1.2 though there is loss of assessment on the FDG portion of the PET exam relative to other areas within the central and lower abdomen, suspect artifact which underestimates the amount of FDG accumulation in the LEFT para-aortic and pelvic lymph nodes. Bulky LEFT pelvic lymph node (image 204/2) 3.2 cm short axis with a maximum SUV of 3.45. Size is similar to the recent CT comparison and enlarged considerably since prior CT from November of 2022 where it measured 7 mm short axis. RIGHT groin lymph node (image 235/2) 2.4 cm short axis similar to recent imaging, considerably enlarged as it was less than a cm in November of 2022 and with a maximum SUV of approaching 3.3. (Image 194/2) RIGHT external iliac lymph nodes largest of which is 16 mm, 3 adjacent lymph nodes all with similar activity, maximum SUV measured 1.71. RIGHT common  iliac lymph node posterior to psoas muscle (image 184/2) 1.6 cm this is definitely contained within an area of artifactual reduction of FDG on the PET portion of the examination. There is no upper abdominal adenopathy or signs of solid organ involvement with focal features. The spleen is not substantially enlarged and is unchanged since recent imaging measuring approximately 11 cm greatest axial dimension, similarly sized in November of 2022. Subcutaneous nodules along the LEFT flank measuring 13 mm by 23 mm (image 173/2, largest nodular area in the LEFT gluteal region) areas present previously measuring less than a cm. Incidental CT findings: Lobular hepatic contours. Post cholecystectomy. Pancreatic atrophy. Single kidney with normal adrenal glands. No acute findings related to the kidneys or urinary bladder. SKELETON: Muscular activity along the LEFT gluteal region without underlying focal area of abnormality subtle activity also in the RIGHT gluteal region favored to be physiologic. No signs of focal musculoskeletal disease involvement. Incidental CT findings: Spinal degenerative changes. Posttraumatic changes about the RIGHT proximal humerus. IMPRESSION: 1. Signs of nodal recurrence in the abdomen and the pelvis. There is limited  FDG uptake in these areas though in some areas there is clear evidence of artifact with reduction in FDG related activity perhaps related to patient movement, attenuation correction and or body habitus. If biopsy is desired the RIGHT groin lymph node is readily accessible and enlarged with low to moderate activity. 2. No signs of disease above the diaphragm. 3. Subcutaneous nodules along the LEFT flank and LEFT gluteal region, largest area in the LEFT gluteal region) present previously measuring less than a centimeter short axis. These are of uncertain significance. There were smaller area seen on prior imaging, therefore disease involvement is considered 4. Physiologic muscular  activity about the neck and shoulders and in the gluteal musculature LEFT greater than RIGHT. 5. Signs of pulmonary emphysema. 6. Aortic atherosclerosis. Aortic Atherosclerosis (ICD10-I70.0) and Emphysema (ICD10-J43.9). Electronically Signed   By: Zetta Bills M.D.   On: 08/25/2022 14:35       Assessment & Plan:  Cough, unspecified type -     DG Chest 2 View; Future  Aortic atherosclerosis (HCC) Assessment & Plan: Continue pravastatin.    Anemia, unspecified type Assessment & Plan: Follow cbc. Being followed by cancer center.    CAD in native artery Assessment & Plan: Continue risk factor modification.  Refer back to cardiology as outlined.     Cardiomyopathy, idiopathic ([place]) Assessment & Plan: Taking lasix. Weight is down. Lower extremity swelling has improved overall with wraps.  In reviewing, it appears last echo 08/2019.  Would like to get him back in with cardiology for f/u.  Some persistent/increased sob with exertion.  Check cxr.  Further pulmonary w/up discussed.  Labs as outlined through Dr [name].  Refer to cardiology - question of need for f/u echo.     Chronic systolic CHF (congestive heart failure) (HCC) Assessment & Plan: Taking lasix.  Refer back to cardiology as outlined.  Last documented ER improved 50-55% - 11.2020.  question of need for f/u echo.  Weigh daily.  Follow low sodium diet.     Chronic venous insufficiency Assessment & Plan: Has been followed by [facility] and home health - wrapping legs.  Swelling improved.    Stage 3b chronic kidney disease [facility]) Assessment & Plan: Has been evaluated by Dr Candiss Norse.  Avoid antiinflammatories.  Follow metabolic panel.    Pulmonary emphysema, unspecified emphysema type ([place]) Assessment & Plan: Reports some increased sob with exertion as outlined.  Weight decreased.  No increased cough or congestion.  Check cxr.     Essential hypertension, benign Assessment & Plan: Taking lasix.  Blood pressure as outlined.   Off coreg. Follow pressures.  Follow metabolic panel.    Gastroparesis Assessment & Plan: Having issues with swallowing as outlined.  Discussed further w/up.  He declines at this time.  Discussed eating slowly and taking small bites.     Laceration of hand without foreign body, unspecified laterality, initial encounter Assessment & Plan: Skin tear.  Dressing removed.  No increased erythema.  Re dressed.  Continue dressing changes at home.  No evidence of infection.  Follow.    Hypercholesterolemia Assessment & Plan: Continue pravastatin.  Low cholesterol diet and exercise.  Follow lipid panel and liver function tests.    Mantle cell lymphoma of lymph nodes of multiple regions [facility]) Assessment & Plan: Dr [name]:  His most recent CT scan on August 07, 2022 reviewed independently and report as above with clearly progressive disease with increasing lymphadenopathy.  recommend PET scan.   PET scan - August 24, 2022 confirmed recurrent  disease - recommend Treanda and Rituxan. Just had f/u 11/05/22.  Recommended f/u PET after fourth cycle.    Sick sinus syndrome [facility]) Assessment & Plan: S/p pacemaker placement.  Followed by cardiology.    Thrombocytopenia ([place]) Assessment & Plan: Follow cbc.    Type 1 diabetes mellitus with microalbuminuria [facility]) Assessment & Plan: Seeing Dr Honor Junes.  Insulin pump.  Not having the increased low sugars as he was previously.  Follow     Depression, major, single episode, mild ([place]) Assessment & Plan: Discussed.  No SI.  Restart prozac '20mg'$  q day.  Follow.    Other orders -     FLUoxetine HCl; Take 1 tablet (20 mg total) by mouth daily.  Dispense: 30 tablet; Refill: 2     [name], MD

## 2022-11-13 NOTE — Telephone Encounter (Signed)
Gary [name] stated that she went to visit on patient on Wednesday and wanted to mention a couple of concerns she has because some times he forgets to mention while he is here. She noticed that he seems sad. His wedding anniversary is coming up. He mentioned having trouble swallowing which has had an effect on his appetite and when she asks him how he is feeling he tells her he is just tired overall. We see him today.

## 2022-11-13 NOTE — Telephone Encounter (Signed)
Seen today in office

## 2022-11-15 ENCOUNTER — Encounter: Payer: Self-pay | Admitting: Internal Medicine

## 2022-11-15 DIAGNOSIS — F320 Major depressive disorder, single episode, mild: Secondary | ICD-10-CM | POA: Insufficient documentation

## 2022-11-15 NOTE — Assessment & Plan Note (Signed)
Dr [name]:  His most recent CT scan on August 07, 2022 reviewed independently and report as above with clearly progressive disease with increasing lymphadenopathy.  recommend PET scan.   PET scan - August 24, 2022 confirmed recurrent disease - recommend Treanda and Rituxan. Just had f/u 11/05/22.  Recommended f/u PET after fourth cycle.

## 2022-11-15 NOTE — Assessment & Plan Note (Signed)
Skin tear.  Dressing removed.  No increased erythema.  Re dressed.  Continue dressing changes at home.  No evidence of infection.  Follow.

## 2022-11-15 NOTE — Assessment & Plan Note (Signed)
Discussed.  No SI.  Restart prozac '20mg'$  q day.  Follow.

## 2022-11-15 NOTE — Assessment & Plan Note (Signed)
Follow cbc. Being followed by cancer center.

## 2022-11-15 NOTE — Assessment & Plan Note (Addendum)
Continue risk factor modification.  Refer back to cardiology as outlined.

## 2022-11-15 NOTE — Assessment & Plan Note (Signed)
S/p pacemaker placement.  Followed by cardiology.  

## 2022-11-15 NOTE — Assessment & Plan Note (Signed)
Taking lasix.  Refer back to cardiology as outlined.  Last documented ER improved 50-55% - 11.2020.  question of need for f/u echo.  Weigh daily.  Follow low sodium diet.

## 2022-11-15 NOTE — Assessment & Plan Note (Signed)
Having issues with swallowing as outlined.  Discussed further w/up.  He declines at this time.  Discussed eating slowly and taking small bites.

## 2022-11-15 NOTE — Assessment & Plan Note (Signed)
Continue pravastatin.  Low cholesterol diet and exercise.  Follow lipid panel and liver function tests.   

## 2022-11-15 NOTE — Assessment & Plan Note (Signed)
Follow [facility].  

## 2022-11-15 NOTE — Assessment & Plan Note (Signed)
Has been evaluated by Dr Candiss Norse.  Avoid antiinflammatories.  Follow metabolic panel.

## 2022-11-15 NOTE — Assessment & Plan Note (Signed)
Continue pravastatin 

## 2022-11-15 NOTE — Assessment & Plan Note (Signed)
Reports some increased sob with exertion as outlined.  Weight decreased.  No increased cough or congestion.  Check cxr.

## 2022-11-15 NOTE — Assessment & Plan Note (Signed)
Has been followed by [facility] and home health - wrapping legs.  Swelling improved.

## 2022-11-15 NOTE — Assessment & Plan Note (Signed)
Taking lasix.  Blood pressure as outlined.  Off coreg. Follow pressures.  Follow metabolic panel.

## 2022-11-15 NOTE — Assessment & Plan Note (Signed)
Taking lasix. Weight is down. Lower extremity swelling has improved overall with wraps.  In reviewing, it appears last echo 08/2019.  Would like to get him back in with cardiology for f/u.  Some persistent/increased sob with exertion.  Check cxr.  Further pulmonary w/up discussed.  Labs as outlined through Dr [name].  Refer to cardiology - question of need for f/u echo.

## 2022-11-15 NOTE — Assessment & Plan Note (Signed)
Seeing Dr Honor Junes.  Insulin pump.  Not having the increased low sugars as he was previously.  Follow

## 2022-11-17 ENCOUNTER — Encounter (INDEPENDENT_AMBULATORY_CARE_PROVIDER_SITE_OTHER): Payer: Medicare Other

## 2022-11-19 DIAGNOSIS — L03115 Cellulitis of right lower limb: Secondary | ICD-10-CM | POA: Diagnosis not present

## 2022-11-19 DIAGNOSIS — E1151 Type 2 diabetes mellitus with diabetic peripheral angiopathy without gangrene: Secondary | ICD-10-CM | POA: Diagnosis not present

## 2022-11-19 DIAGNOSIS — I872 Venous insufficiency (chronic) (peripheral): Secondary | ICD-10-CM | POA: Diagnosis not present

## 2022-11-19 DIAGNOSIS — I890 Lymphedema, not elsewhere classified: Secondary | ICD-10-CM | POA: Diagnosis not present

## 2022-11-19 DIAGNOSIS — L97812 Non-pressure chronic ulcer of other part of right lower leg with fat layer exposed: Secondary | ICD-10-CM | POA: Diagnosis not present

## 2022-11-19 DIAGNOSIS — L03116 Cellulitis of left lower limb: Secondary | ICD-10-CM | POA: Diagnosis not present

## 2022-11-22 DIAGNOSIS — I890 Lymphedema, not elsewhere classified: Secondary | ICD-10-CM | POA: Diagnosis not present

## 2022-11-22 DIAGNOSIS — E1142 Type 2 diabetes mellitus with diabetic polyneuropathy: Secondary | ICD-10-CM | POA: Diagnosis not present

## 2022-11-22 DIAGNOSIS — Z6836 Body mass index (BMI) 36.0-36.9, adult: Secondary | ICD-10-CM | POA: Diagnosis not present

## 2022-11-22 DIAGNOSIS — Z794 Long term (current) use of insulin: Secondary | ICD-10-CM | POA: Diagnosis not present

## 2022-11-22 DIAGNOSIS — Z950 Presence of cardiac pacemaker: Secondary | ICD-10-CM | POA: Diagnosis not present

## 2022-11-22 DIAGNOSIS — Z9641 Presence of insulin pump (external) (internal): Secondary | ICD-10-CM | POA: Diagnosis not present

## 2022-11-22 DIAGNOSIS — E0500 Thyrotoxicosis with diffuse goiter without thyrotoxic crisis or storm: Secondary | ICD-10-CM | POA: Diagnosis not present

## 2022-11-22 DIAGNOSIS — C8310 Mantle cell lymphoma, unspecified site: Secondary | ICD-10-CM | POA: Diagnosis not present

## 2022-11-22 DIAGNOSIS — L03115 Cellulitis of right lower limb: Secondary | ICD-10-CM | POA: Diagnosis not present

## 2022-11-22 DIAGNOSIS — I110 Hypertensive heart disease with heart failure: Secondary | ICD-10-CM | POA: Diagnosis not present

## 2022-11-22 DIAGNOSIS — K3184 Gastroparesis: Secondary | ICD-10-CM | POA: Diagnosis not present

## 2022-11-22 DIAGNOSIS — Z87891 Personal history of nicotine dependence: Secondary | ICD-10-CM | POA: Diagnosis not present

## 2022-11-22 DIAGNOSIS — E1143 Type 2 diabetes mellitus with diabetic autonomic (poly)neuropathy: Secondary | ICD-10-CM | POA: Diagnosis not present

## 2022-11-22 DIAGNOSIS — L97812 Non-pressure chronic ulcer of other part of right lower leg with fat layer exposed: Secondary | ICD-10-CM | POA: Diagnosis not present

## 2022-11-22 DIAGNOSIS — I509 Heart failure, unspecified: Secondary | ICD-10-CM | POA: Diagnosis not present

## 2022-11-22 DIAGNOSIS — Z9181 History of falling: Secondary | ICD-10-CM | POA: Diagnosis not present

## 2022-11-22 DIAGNOSIS — E1151 Type 2 diabetes mellitus with diabetic peripheral angiopathy without gangrene: Secondary | ICD-10-CM | POA: Diagnosis not present

## 2022-11-22 DIAGNOSIS — Z905 Acquired absence of kidney: Secondary | ICD-10-CM | POA: Diagnosis not present

## 2022-11-22 DIAGNOSIS — F32A Depression, unspecified: Secondary | ICD-10-CM | POA: Diagnosis not present

## 2022-11-22 DIAGNOSIS — L03116 Cellulitis of left lower limb: Secondary | ICD-10-CM | POA: Diagnosis not present

## 2022-11-22 DIAGNOSIS — I872 Venous insufficiency (chronic) (peripheral): Secondary | ICD-10-CM | POA: Diagnosis not present

## 2022-11-22 DIAGNOSIS — E7800 Pure hypercholesterolemia, unspecified: Secondary | ICD-10-CM | POA: Diagnosis not present

## 2022-11-22 DIAGNOSIS — E11319 Type 2 diabetes mellitus with unspecified diabetic retinopathy without macular edema: Secondary | ICD-10-CM | POA: Diagnosis not present

## 2022-11-25 ENCOUNTER — Encounter (INDEPENDENT_AMBULATORY_CARE_PROVIDER_SITE_OTHER): Payer: Self-pay | Admitting: Nurse Practitioner

## 2022-11-25 ENCOUNTER — Other Ambulatory Visit: Payer: Self-pay | Admitting: Oncology

## 2022-11-25 ENCOUNTER — Ambulatory Visit (INDEPENDENT_AMBULATORY_CARE_PROVIDER_SITE_OTHER): Payer: Medicare Other | Admitting: Nurse Practitioner

## 2022-11-25 VITALS — BP 119/72 | HR 99 | Resp 18 | Wt 235.0 lb

## 2022-11-25 DIAGNOSIS — L03119 Cellulitis of unspecified part of limb: Secondary | ICD-10-CM

## 2022-11-25 DIAGNOSIS — E1151 Type 2 diabetes mellitus with diabetic peripheral angiopathy without gangrene: Secondary | ICD-10-CM | POA: Diagnosis not present

## 2022-11-25 DIAGNOSIS — I10 Essential (primary) hypertension: Secondary | ICD-10-CM

## 2022-11-25 DIAGNOSIS — C8318 Mantle cell lymphoma, lymph nodes of multiple sites: Secondary | ICD-10-CM

## 2022-11-25 DIAGNOSIS — I890 Lymphedema, not elsewhere classified: Secondary | ICD-10-CM | POA: Diagnosis not present

## 2022-11-25 MED ORDER — SULFAMETHOXAZOLE-TRIMETHOPRIM 800-160 MG PO TABS: 1.0000 | ORAL_TABLET | Freq: Two times a day (BID) | ORAL | 0 refills | Status: DC

## 2022-11-28 ENCOUNTER — Other Ambulatory Visit: Payer: Self-pay | Admitting: Internal Medicine

## 2022-11-30 DIAGNOSIS — I5022 Chronic systolic (congestive) heart failure: Secondary | ICD-10-CM | POA: Diagnosis not present

## 2022-11-30 DIAGNOSIS — R0602 Shortness of breath: Secondary | ICD-10-CM | POA: Diagnosis not present

## 2022-11-30 DIAGNOSIS — E1069 Type 1 diabetes mellitus with other specified complication: Secondary | ICD-10-CM | POA: Diagnosis not present

## 2022-11-30 DIAGNOSIS — Z6841 Body Mass Index (BMI) 40.0 and over, adult: Secondary | ICD-10-CM | POA: Diagnosis not present

## 2022-11-30 DIAGNOSIS — I495 Sick sinus syndrome: Secondary | ICD-10-CM | POA: Diagnosis not present

## 2022-11-30 DIAGNOSIS — I2089 Other forms of angina pectoris: Secondary | ICD-10-CM | POA: Diagnosis not present

## 2022-11-30 DIAGNOSIS — K219 Gastro-esophageal reflux disease without esophagitis: Secondary | ICD-10-CM | POA: Diagnosis not present

## 2022-11-30 DIAGNOSIS — C8590 Non-Hodgkin lymphoma, unspecified, unspecified site: Secondary | ICD-10-CM | POA: Diagnosis not present

## 2022-11-30 DIAGNOSIS — I493 Ventricular premature depolarization: Secondary | ICD-10-CM | POA: Diagnosis not present

## 2022-11-30 DIAGNOSIS — E10649 Type 1 diabetes mellitus with hypoglycemia without coma: Secondary | ICD-10-CM | POA: Diagnosis not present

## 2022-11-30 DIAGNOSIS — E785 Hyperlipidemia, unspecified: Secondary | ICD-10-CM | POA: Diagnosis not present

## 2022-12-02 DIAGNOSIS — E10649 Type 1 diabetes mellitus with hypoglycemia without coma: Secondary | ICD-10-CM | POA: Diagnosis not present

## 2022-12-02 DIAGNOSIS — I152 Hypertension secondary to endocrine disorders: Secondary | ICD-10-CM | POA: Diagnosis not present

## 2022-12-02 DIAGNOSIS — E1069 Type 1 diabetes mellitus with other specified complication: Secondary | ICD-10-CM | POA: Diagnosis not present

## 2022-12-02 DIAGNOSIS — E785 Hyperlipidemia, unspecified: Secondary | ICD-10-CM | POA: Diagnosis not present

## 2022-12-02 LAB — HEMOGLOBIN A1C: Hemoglobin A1C: 6.4

## 2022-12-02 MED FILL — Dexamethasone Sodium Phosphate Inj 100 MG/10ML: INTRAMUSCULAR | Qty: 1 | Status: AC

## 2022-12-03 ENCOUNTER — Inpatient Hospital Stay: Payer: Medicare Other

## 2022-12-03 ENCOUNTER — Inpatient Hospital Stay (HOSPITAL_BASED_OUTPATIENT_CLINIC_OR_DEPARTMENT_OTHER): Payer: Medicare Other | Admitting: Oncology

## 2022-12-03 ENCOUNTER — Encounter: Payer: Self-pay | Admitting: Oncology

## 2022-12-03 ENCOUNTER — Inpatient Hospital Stay: Payer: Medicare Other | Attending: Oncology

## 2022-12-03 VITALS — BP 104/67 | HR 82

## 2022-12-03 VITALS — BP 106/63 | HR 64 | Temp 97.3°F | Wt 233.0 lb

## 2022-12-03 DIAGNOSIS — C8318 Mantle cell lymphoma, lymph nodes of multiple sites: Secondary | ICD-10-CM

## 2022-12-03 DIAGNOSIS — I509 Heart failure, unspecified: Secondary | ICD-10-CM | POA: Insufficient documentation

## 2022-12-03 DIAGNOSIS — Z9641 Presence of insulin pump (external) (internal): Secondary | ICD-10-CM | POA: Diagnosis not present

## 2022-12-03 DIAGNOSIS — Z803 Family history of malignant neoplasm of breast: Secondary | ICD-10-CM | POA: Diagnosis not present

## 2022-12-03 DIAGNOSIS — D649 Anemia, unspecified: Secondary | ICD-10-CM | POA: Insufficient documentation

## 2022-12-03 DIAGNOSIS — R001 Bradycardia, unspecified: Secondary | ICD-10-CM | POA: Insufficient documentation

## 2022-12-03 DIAGNOSIS — E1165 Type 2 diabetes mellitus with hyperglycemia: Secondary | ICD-10-CM | POA: Insufficient documentation

## 2022-12-03 DIAGNOSIS — C8315 Mantle cell lymphoma, lymph nodes of inguinal region and lower limb: Secondary | ICD-10-CM | POA: Diagnosis not present

## 2022-12-03 DIAGNOSIS — Z95828 Presence of other vascular implants and grafts: Secondary | ICD-10-CM

## 2022-12-03 DIAGNOSIS — R0609 Other forms of dyspnea: Secondary | ICD-10-CM | POA: Insufficient documentation

## 2022-12-03 DIAGNOSIS — Z807 Family history of other malignant neoplasms of lymphoid, hematopoietic and related tissues: Secondary | ICD-10-CM | POA: Diagnosis not present

## 2022-12-03 DIAGNOSIS — Z950 Presence of cardiac pacemaker: Secondary | ICD-10-CM | POA: Insufficient documentation

## 2022-12-03 DIAGNOSIS — N289 Disorder of kidney and ureter, unspecified: Secondary | ICD-10-CM | POA: Diagnosis not present

## 2022-12-03 DIAGNOSIS — Z87891 Personal history of nicotine dependence: Secondary | ICD-10-CM | POA: Insufficient documentation

## 2022-12-03 DIAGNOSIS — R197 Diarrhea, unspecified: Secondary | ICD-10-CM | POA: Diagnosis not present

## 2022-12-03 DIAGNOSIS — I110 Hypertensive heart disease with heart failure: Secondary | ICD-10-CM | POA: Insufficient documentation

## 2022-12-03 LAB — CBC WITH DIFFERENTIAL/PLATELET
Abs Immature Granulocytes: 0.03 10*3/uL (ref 0.00–0.07)
Basophils Absolute: 0.1 10*3/uL (ref 0.0–0.1)
Basophils Relative: 1 %
Eosinophils Absolute: 0.1 10*3/uL (ref 0.0–0.5)
Eosinophils Relative: 1 %
HCT: 32.7 % — ABNORMAL LOW (ref 39.0–52.0)
Hemoglobin: 11 g/dL — ABNORMAL LOW (ref 13.0–17.0)
Immature Granulocytes: 1 %
Lymphocytes Relative: 35 %
Lymphs Abs: 1.6 10*3/uL (ref 0.7–4.0)
MCH: 31.5 pg (ref 26.0–34.0)
MCHC: 33.6 g/dL (ref 30.0–36.0)
MCV: 93.7 fL (ref 80.0–100.0)
Monocytes Absolute: 0.4 10*3/uL (ref 0.1–1.0)
Monocytes Relative: 9 %
Neutro Abs: 2.4 10*3/uL (ref 1.7–7.7)
Neutrophils Relative %: 53 %
Platelets: 65 10*3/uL — ABNORMAL LOW (ref 150–400)
RBC: 3.49 MIL/uL — ABNORMAL LOW (ref 4.22–5.81)
RDW: 15.9 % — ABNORMAL HIGH (ref 11.5–15.5)
WBC: 4.6 10*3/uL (ref 4.0–10.5)
nRBC: 0 % (ref 0.0–0.2)

## 2022-12-03 LAB — COMPREHENSIVE METABOLIC PANEL
ALT: 16 U/L (ref 0–44)
AST: 42 U/L — ABNORMAL HIGH (ref 15–41)
Albumin: 3.4 g/dL — ABNORMAL LOW (ref 3.5–5.0)
Alkaline Phosphatase: 123 U/L (ref 38–126)
Anion gap: 12 (ref 5–15)
BUN: 31 mg/dL — ABNORMAL HIGH (ref 8–23)
CO2: 20 mmol/L — ABNORMAL LOW (ref 22–32)
Calcium: 7.9 mg/dL — ABNORMAL LOW (ref 8.9–10.3)
Chloride: 101 mmol/L (ref 98–111)
Creatinine, Ser: 2 mg/dL — ABNORMAL HIGH (ref 0.61–1.24)
GFR, Estimated: 34 mL/min — ABNORMAL LOW (ref >60)
Glucose, Bld: 155 mg/dL — ABNORMAL HIGH (ref 70–99)
Potassium: 4 mmol/L (ref 3.5–5.1)
Sodium: 133 mmol/L — ABNORMAL LOW (ref 135–145)
Total Bilirubin: 0.5 mg/dL (ref 0.3–1.2)
Total Protein: 6.5 g/dL (ref 6.5–8.1)

## 2022-12-03 MED ORDER — HEPARIN SOD (PORK) LOCK FLUSH 100 UNIT/ML IV SOLN
500.0000 [IU] | Freq: Once | INTRAVENOUS | Status: AC
  Administered 2022-12-03: 500 [IU] via INTRAVENOUS
  Filled 2022-12-03: qty 5

## 2022-12-03 MED ORDER — SODIUM CHLORIDE 0.9% FLUSH
10.0000 mL | INTRAVENOUS | Status: DC | PRN
  Administered 2022-12-03: 10 mL via INTRAVENOUS
  Filled 2022-12-03: qty 10

## 2022-12-03 MED ORDER — SODIUM CHLORIDE 0.9 % IV SOLN
Freq: Once | INTRAVENOUS | Status: AC
  Filled 2022-12-03: qty 250

## 2022-12-03 NOTE — Patient Instructions (Signed)

## 2022-12-03 NOTE — Progress Notes (Signed)
Gary [name]  [phone] Fax:(336) [phone]  ID: Gary [name] OB: [birth year]  MR#: [id number]  [id number]  Patient Care Team: [name], MD as PCP - General (Internal Medicine) Lloyd Huger, MD as Consulting Physician (Oncology) Lucky Cowboy Erskine Squibb, MD as Referring Physician (Vascular Surgery) Schnier, Dolores Lory, MD (Vascular Surgery) Corey Skains, MD as Consulting Physician (Cardiology)  CHIEF COMPLAINT: Recurrent Mantle cell lymphoma.  INTERVAL HISTORY: Patient returns to clinic today for further evaluation and consideration of cycle 4 of Rituxan and Treanda.  Patient states over the last month he has had increased weakness and fatigue, poor appetite, and diarrhea.  He also has a declining performance status.  He denies any fevers, chills, or night sweats.  He has no neurologic complaints.  He has dyspnea on exertion, but denies any chest pain, shortness of breath, cough, or hemoptysis.  He has no nausea, vomiting, or constipation.  He denies any melena or hematochezia.  He has no urinary complaints.  Patient offers no further complaints today.  Review of Systems  Constitutional:  Positive for malaise/fatigue. Negative for diaphoresis, fever and weight loss.  Respiratory: Negative.  Negative for cough, hemoptysis and shortness of breath.   Cardiovascular: Negative.  Negative for chest pain and leg swelling.  Gastrointestinal:  Positive for diarrhea. Negative for abdominal pain.  Genitourinary: Negative.  Negative for dysuria.  Musculoskeletal: Negative.  Negative for back pain.  Skin: Negative.  Negative for rash.  Neurological:  Positive for weakness. Negative for dizziness, focal weakness and headaches.  Psychiatric/Behavioral: Negative.  The patient is not nervous/anxious.     As per HPI. Otherwise, a complete review of systems is negative.  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    Gastroparesis    Graves disease    Hypercholesterolemia    Hypertension    Mantle cell lymphoma ([place]) 07/2014   Peripheral neuropathy    Shingles     PAST SURGICAL HISTORY: Past Surgical History:  Procedure Laterality Date   CHOLECYSTECTOMY     NEPHRECTOMY     right   PACEMAKER INSERTION N/A 12/07/2017   Procedure: INSERTION PACEMAKER DUAL CHAMBER INITIAL INSERT;  Surgeon: Isaias Cowman, MD;  Location: [facility];  Service: Cardiovascular;  Laterality: N/A;   PORTA CATH INSERTION N/A 11/03/2017   Procedure: PORTA CATH INSERTION;  Surgeon: Katha Cabal, MD;  Location: [facility];  Service: Cardiovascular;  Laterality: N/A;    FAMILY HISTORY Family History  Problem Relation Age of Onset   Breast cancer Mother    Diabetes Father    Cancer Father        Lymphoma   Alcohol abuse Maternal Uncle    Diabetes Sister    Heart disease Other        grandfather       ADVANCED DIRECTIVES:    HEALTH MAINTENANCE: Social History   Tobacco Use   Smoking status: Former    Types: Cigarettes    Passive exposure: Past   Smokeless tobacco: Current    Types: Chew  Vaping Use   Vaping Use: Never used  Substance Use Topics   Alcohol use: Yes    Alcohol/week: 3.0 standard drinks of alcohol    Types: 3 Cans of beer per week    Comment: nightly   Drug use: Never  Colonoscopy:  PAP:  Bone density:  Lipid panel:  Allergies  Allergen Reactions   Rofecoxib Nausea Only    Current Outpatient Medications  Medication Sig Dispense Refill   acyclovir (ZOVIRAX) 400 MG tablet TAKE 1 TABLET BY MOUTH EVERY DAY 90 tablet 1   bacitracin 500 UNIT/GM ointment Apply 1 application topically daily.     Cholecalciferol (VITAMIN D3) 50 MCG (2000 UT) CAPS Take 2,000 Units by mouth daily.     feeding supplement, GLUCERNA SHAKE, (GLUCERNA SHAKE) LIQD  Take 237 mLs by mouth 2 (two) times daily between meals. 237 mL 0   ferrous sulfate 325 (65 FE) MG tablet Take 325 mg by mouth daily with breakfast.     FLUoxetine (PROZAC) 20 MG tablet Take 1 tablet (20 mg total) by mouth daily. 30 tablet 2   furosemide (LASIX) 40 MG tablet Take 2 tablets in the morning, and 1 tablet in the evening. 90 tablet 6   glucose 4 GM chewable tablet Chew 1 tablet (4 g total) by mouth once as needed for low blood sugar. 50 tablet 0   insulin aspart (NOVOLOG) 100 UNIT/ML injection Inject 6 Units into the skin 3 (three) times daily with meals.     Insulin Human (INSULIN PUMP) SOLN Inject into the skin.     levothyroxine (SYNTHROID) 25 MCG tablet Take 1 tablet (25 mcg total) by mouth daily before breakfast. 30 tablet 1   levothyroxine (SYNTHROID) 25 MCG tablet Take 25 mcg by mouth daily before breakfast.     lidocaine-prilocaine (EMLA) cream Apply to affected area once 30 g 3   nystatin cream (MYCOSTATIN) APPLY TO AFFECTED AREA TWICE A DAY 30 g 1   ondansetron (ZOFRAN) 8 MG tablet Take 1 tablet (8 mg total) by mouth every 8 (eight) hours as needed for nausea or vomiting. Start on the third day after chemotherapy. 60 tablet 2   potassium chloride (KLOR-CON) 10 MEQ tablet TAKE 1 TABLET BY MOUTH TWICE A DAY 180 tablet 2   pravastatin (PRAVACHOL) 40 MG tablet TAKE 1 TABLET BY MOUTH EVERY DAY 90 tablet 1   prochlorperazine (COMPAZINE) 10 MG tablet TAKE 1 TABLET BY MOUTH EVERY 6 HOURS AS NEEDED FOR NAUSEA OR VOMITING. 60 tablet 2   sulfamethoxazole-trimethoprim (BACTRIM DS) 800-160 MG tablet Take 1 tablet by mouth 2 (two) times daily. 20 tablet 0   vitamin B-12 (CYANOCOBALAMIN) 1000 MCG tablet Take 1,000 mcg by mouth daily.     No current facility-administered medications for this visit.   Facility-Administered Medications Ordered in Other Visits  Medication Dose Route Frequency Provider Last Rate Last Admin   heparin lock flush 100 unit/mL  500 Units Intravenous Once Lloyd Huger, MD       sodium chloride flush (NS) 0.9 % injection 10 mL  10 mL Intravenous PRN Lloyd Huger, MD   10 mL at 02/01/18 0829   sodium chloride flush (NS) 0.9 % injection 10 mL  10 mL Intravenous PRN Lloyd Huger, MD   10 mL at 04/05/18 0800   sodium chloride flush (NS) 0.9 % injection 10 mL  10 mL Intravenous PRN Lloyd Huger, MD   10 mL at 12/03/22 1133    OBJECTIVE: Vitals:   12/03/22 0834  BP: 106/63  Pulse: 64  Temp: (!) 97.3 F (36.3 C)  SpO2: 99%     Body mass index is 38.77 kg/m.    ECOG FS:2 - Symptomatic, <50% confined to bed  General: Well-developed, well-nourished, no acute  distress. Eyes: Pink conjunctiva, anicteric sclera. HEENT: Normocephalic, moist mucous membranes. Lungs: No audible wheezing or coughing. Heart: Regular rate and rhythm. Abdomen: Soft, nontender, no obvious distention. Musculoskeletal: No edema, cyanosis, or clubbing. Neuro: Alert, answering all questions appropriately. Cranial nerves grossly intact. Skin: No rashes or petechiae noted. Psych: Normal affect.   LAB RESULTS:  Lab Results  Component Value Date   NA 133 (L) 12/03/2022   K 4.0 12/03/2022   CL 101 12/03/2022   CO2 20 (L) 12/03/2022   GLUCOSE 155 (H) 12/03/2022   BUN 31 (H) 12/03/2022   CREATININE 2.00 (H) 12/03/2022   CALCIUM 7.9 (L) 12/03/2022   PROT 6.5 12/03/2022   ALBUMIN 3.4 (L) 12/03/2022   AST 42 (H) 12/03/2022   ALT 16 12/03/2022   ALKPHOS 123 12/03/2022   BILITOT 0.5 12/03/2022   GFRNONAA 34 (L) 12/03/2022   GFRAA 41 (L) 01/24/2020    Lab Results  Component Value Date   WBC 4.6 12/03/2022   NEUTROABS 2.4 12/03/2022   HGB 11.0 (L) 12/03/2022   HCT 32.7 (L) 12/03/2022   MCV 93.7 12/03/2022   PLT 65 (L) 12/03/2022   Lab Results  Component Value Date   IRON 13 (L) 08/11/2017   TIBC 223 (L) 01/12/2017   IRONPCTSAT 3.8 (L) 08/11/2017   Lab Results  Component Value Date   FERRITIN 42.0 08/11/2017      STUDIES:   ASSESSMENT: Recurrent low-grade mantle cell lymphoma. PLAN:    Mantle cell lymphoma, stage III: Patient's initial diagnosis was on inguinal lymph node biopsy on July 12, 2014.  Patient was noted to have progressive disease and underwent chemotherapy using Rituxan and Treanda completing cycle 4 on Mar 09, 2018.  His most recent CT scan on August 07, 2022 reviewed independently with clearly progressive disease with increasing lymphadenopathy.  PET scan results from August 24, 2022 confirmed recurrent disease.  Since patient is over 4 years removed from his last treatment, can repeat chemotherapy using Treanda and Rituxan.  Donnie Aho was previously dose reduced secondary to chronic thrombocytopenia.  Delay cycle 3 of treatment today given patient's persistent symptoms and declining performance status.  He will have a PET scan next week with follow-up 2 to 3 days later.  Will determine whether to reinitiate chemotherapy at that time.   Decreased ejection fraction: Improved.  Patient's most recent cardiac echo in November 2020 revealed an ejection fraction estimated to be 50 to 55%. Continue monitoring and evaluation per cardiology. Hyperglycemia: Chronic and unchanged.  Patient has improved blood glucose control.  Continue insulin pump as directed. Thrombocytopenia: Platelet count has trended down to 65.  Previously Treanda was dose reduced.  Hold treatment as above. Bradycardia: Patient has pacemaker in place. Renal insufficiency: Chronic and unchanged.  Patient's creatinine is 2.0.  Neither Treanda or Rituxan need to be dose reduced with renal insufficiency. Nausea: Patient has been instructed to use his antiemetics as prescribed. Anemia: Mild, monitor. Diarrhea: Continue Imodium as needed.   Patient expressed understanding and was in agreement with this plan. He also understands that He can call clinic at any time with any questions, concerns, or complaints.    Lloyd Huger, MD   12/03/2022 1:46 PM

## 2022-12-04 ENCOUNTER — Inpatient Hospital Stay: Payer: Medicare Other

## 2022-12-04 ENCOUNTER — Other Ambulatory Visit: Payer: Self-pay | Admitting: Oncology

## 2022-12-04 DIAGNOSIS — I890 Lymphedema, not elsewhere classified: Secondary | ICD-10-CM | POA: Diagnosis not present

## 2022-12-04 DIAGNOSIS — E1151 Type 2 diabetes mellitus with diabetic peripheral angiopathy without gangrene: Secondary | ICD-10-CM | POA: Diagnosis not present

## 2022-12-04 DIAGNOSIS — C8318 Mantle cell lymphoma, lymph nodes of multiple sites: Secondary | ICD-10-CM

## 2022-12-04 DIAGNOSIS — L03116 Cellulitis of left lower limb: Secondary | ICD-10-CM | POA: Diagnosis not present

## 2022-12-04 DIAGNOSIS — L97812 Non-pressure chronic ulcer of other part of right lower leg with fat layer exposed: Secondary | ICD-10-CM | POA: Diagnosis not present

## 2022-12-04 DIAGNOSIS — I872 Venous insufficiency (chronic) (peripheral): Secondary | ICD-10-CM | POA: Diagnosis not present

## 2022-12-04 DIAGNOSIS — L03115 Cellulitis of right lower limb: Secondary | ICD-10-CM | POA: Diagnosis not present

## 2022-12-09 ENCOUNTER — Other Ambulatory Visit: Payer: Self-pay | Admitting: Internal Medicine

## 2022-12-09 DIAGNOSIS — R0602 Shortness of breath: Secondary | ICD-10-CM

## 2022-12-09 DIAGNOSIS — I2089 Other forms of angina pectoris: Secondary | ICD-10-CM

## 2022-12-10 ENCOUNTER — Ambulatory Visit: Payer: 59 | Admitting: Oncology

## 2022-12-10 ENCOUNTER — Ambulatory Visit
Admission: RE | Admit: 2022-12-10 | Discharge: 2022-12-10 | Disposition: A | Discharge status: Home / Self Care | Payer: Medicare Other | Source: Ambulatory Visit | Attending: Oncology | Admitting: Oncology

## 2022-12-10 ENCOUNTER — Ambulatory Visit: Payer: 59

## 2022-12-10 ENCOUNTER — Inpatient Hospital Stay: Payer: Medicare Other

## 2022-12-10 ENCOUNTER — Other Ambulatory Visit: Payer: 59

## 2022-12-10 ENCOUNTER — Other Ambulatory Visit: Payer: Self-pay | Admitting: Internal Medicine

## 2022-12-10 VITALS — Ht 65.0 in | Wt 227.0 lb

## 2022-12-10 DIAGNOSIS — J439 Emphysema, unspecified: Secondary | ICD-10-CM | POA: Diagnosis not present

## 2022-12-10 DIAGNOSIS — I2510 Atherosclerotic heart disease of native coronary artery without angina pectoris: Secondary | ICD-10-CM | POA: Diagnosis not present

## 2022-12-10 DIAGNOSIS — C8318 Mantle cell lymphoma, lymph nodes of multiple sites: Secondary | ICD-10-CM | POA: Insufficient documentation

## 2022-12-10 DIAGNOSIS — I2089 Other forms of angina pectoris: Secondary | ICD-10-CM

## 2022-12-10 DIAGNOSIS — I700 Atherosclerosis of aorta: Secondary | ICD-10-CM | POA: Insufficient documentation

## 2022-12-10 DIAGNOSIS — R0600 Dyspnea, unspecified: Secondary | ICD-10-CM

## 2022-12-10 LAB — GLUCOSE, CAPILLARY: Glucose-Capillary: 164 mg/dL — ABNORMAL HIGH (ref 70–99)

## 2022-12-10 MED ORDER — FLUDEOXYGLUCOSE F - 18 (FDG) INJECTION
11.8000 | Freq: Once | INTRAVENOUS | Status: AC | PRN
  Administered 2022-12-10: 12.61 via INTRAVENOUS

## 2022-12-10 MED FILL — Dexamethasone Sodium Phosphate Inj 100 MG/10ML: INTRAMUSCULAR | Qty: 1 | Status: AC

## 2022-12-11 ENCOUNTER — Inpatient Hospital Stay: Payer: Medicare Other | Attending: Oncology

## 2022-12-11 ENCOUNTER — Encounter: Payer: Self-pay | Admitting: Oncology

## 2022-12-11 ENCOUNTER — Ambulatory Visit: Payer: Medicare Other

## 2022-12-11 ENCOUNTER — Inpatient Hospital Stay (HOSPITAL_BASED_OUTPATIENT_CLINIC_OR_DEPARTMENT_OTHER): Payer: Medicare Other | Attending: Oncology | Admitting: Oncology

## 2022-12-11 DIAGNOSIS — E860 Dehydration: Secondary | ICD-10-CM | POA: Diagnosis not present

## 2022-12-11 DIAGNOSIS — E1142 Type 2 diabetes mellitus with diabetic polyneuropathy: Secondary | ICD-10-CM | POA: Insufficient documentation

## 2022-12-11 DIAGNOSIS — D649 Anemia, unspecified: Secondary | ICD-10-CM | POA: Diagnosis not present

## 2022-12-11 DIAGNOSIS — N189 Chronic kidney disease, unspecified: Secondary | ICD-10-CM | POA: Insufficient documentation

## 2022-12-11 DIAGNOSIS — R197 Diarrhea, unspecified: Secondary | ICD-10-CM | POA: Diagnosis not present

## 2022-12-11 DIAGNOSIS — E876 Hypokalemia: Secondary | ICD-10-CM | POA: Insufficient documentation

## 2022-12-11 DIAGNOSIS — C8318 Mantle cell lymphoma, lymph nodes of multiple sites: Secondary | ICD-10-CM | POA: Diagnosis not present

## 2022-12-11 DIAGNOSIS — E1122 Type 2 diabetes mellitus with diabetic chronic kidney disease: Secondary | ICD-10-CM | POA: Diagnosis not present

## 2022-12-11 DIAGNOSIS — I130 Hypertensive heart and chronic kidney disease with heart failure and stage 1 through stage 4 chronic kidney disease, or unspecified chronic kidney disease: Secondary | ICD-10-CM | POA: Diagnosis not present

## 2022-12-11 DIAGNOSIS — R531 Weakness: Secondary | ICD-10-CM | POA: Insufficient documentation

## 2022-12-11 DIAGNOSIS — Z87891 Personal history of nicotine dependence: Secondary | ICD-10-CM | POA: Insufficient documentation

## 2022-12-11 DIAGNOSIS — Z794 Long term (current) use of insulin: Secondary | ICD-10-CM | POA: Insufficient documentation

## 2022-12-11 DIAGNOSIS — Z9641 Presence of insulin pump (external) (internal): Secondary | ICD-10-CM | POA: Diagnosis not present

## 2022-12-11 DIAGNOSIS — Z807 Family history of other malignant neoplasms of lymphoid, hematopoietic and related tissues: Secondary | ICD-10-CM | POA: Diagnosis not present

## 2022-12-11 DIAGNOSIS — Z803 Family history of malignant neoplasm of breast: Secondary | ICD-10-CM | POA: Diagnosis not present

## 2022-12-11 DIAGNOSIS — I509 Heart failure, unspecified: Secondary | ICD-10-CM | POA: Insufficient documentation

## 2022-12-11 DIAGNOSIS — C8315 Mantle cell lymphoma, lymph nodes of inguinal region and lower limb: Secondary | ICD-10-CM | POA: Insufficient documentation

## 2022-12-11 DIAGNOSIS — E1165 Type 2 diabetes mellitus with hyperglycemia: Secondary | ICD-10-CM | POA: Diagnosis not present

## 2022-12-11 NOTE — Progress Notes (Signed)
[place]  Telephone:(336) [phone] Fax:(336) [phone]  ID: Gary [name] OB: [birth year]  MR#: [id number]  [id number]  Patient Care Team: [name], MD as PCP - General (Internal Medicine) Lloyd Huger, MD as Consulting Physician (Oncology) Lucky Cowboy Erskine Squibb, MD as Referring Physician (Vascular Surgery) Schnier, Dolores Lory, MD (Vascular Surgery) Corey Skains, MD as Consulting Physician (Cardiology)  CHIEF COMPLAINT: Recurrent Mantle cell lymphoma.  INTERVAL HISTORY: Patient returns to clinic today for further evaluation and discussion of his imaging results.  He feels significantly improved other than some mild residual diarrhea and is nearly back to his baseline. He denies any fevers, chills, or night sweats.  He has no neurologic complaints.  He has dyspnea on exertion, but denies any chest pain, shortness of breath, cough, or hemoptysis.  He has no nausea, vomiting, or constipation.  He denies any melena or hematochezia.  He has no urinary complaints.  Patient offers no further specific complaints today.  Review of Systems  Constitutional: Negative.  Negative for diaphoresis, fever, malaise/fatigue and weight loss.  Respiratory: Negative.  Negative for cough, hemoptysis and shortness of breath.   Cardiovascular: Negative.  Negative for chest pain and leg swelling.  Gastrointestinal:  Positive for diarrhea. Negative for abdominal pain.  Genitourinary: Negative.  Negative for dysuria.  Musculoskeletal: Negative.  Negative for back pain.  Skin: Negative.  Negative for rash.  Neurological: Negative.  Negative for dizziness, focal weakness, weakness and headaches.  Psychiatric/Behavioral: Negative.  The patient is not nervous/anxious.     As per HPI. Otherwise, a complete review of systems is negative.  PAST MEDICAL HISTORY: Past Medical History:  Diagnosis Date   CHF (congestive heart failure) ([place])    Depression    Diabetes mellitus due to  underlying condition with diabetic retinopathy with macular edema    Gastroparesis    Graves disease    Hypercholesterolemia    Hypertension    Mantle cell lymphoma ([place]) 07/2014   Peripheral neuropathy    Shingles     PAST SURGICAL HISTORY: Past Surgical History:  Procedure Laterality Date   CHOLECYSTECTOMY     NEPHRECTOMY     right   PACEMAKER INSERTION N/A 12/07/2017   Procedure: INSERTION PACEMAKER DUAL CHAMBER INITIAL INSERT;  Surgeon: Isaias Cowman, MD;  Location: [facility];  Service: Cardiovascular;  Laterality: N/A;   PORTA CATH INSERTION N/A 11/03/2017   Procedure: PORTA CATH INSERTION;  Surgeon: Katha Cabal, MD;  Location: [place] [facility];  Service: Cardiovascular;  Laterality: N/A;    FAMILY HISTORY Family History  Problem Relation Age of Onset   Breast cancer Mother    Diabetes Father    Cancer Father        Lymphoma   Alcohol abuse Maternal Uncle    Diabetes Sister    Heart disease Other        grandfather       ADVANCED DIRECTIVES:    HEALTH MAINTENANCE: Social History   Tobacco Use   Smoking status: Former    Types: Cigarettes    Passive exposure: Past   Smokeless tobacco: Current    Types: Chew  Vaping Use   Vaping Use: Never used  Substance Use Topics   Alcohol use: Yes    Alcohol/week: 3.0 standard drinks of alcohol    Types: 3 Cans of beer per week    Comment: nightly   Drug use: Never     Colonoscopy:  PAP:  Bone density:  Lipid panel:  Allergies  Allergen Reactions   Rofecoxib Nausea Only    Current Outpatient Medications  Medication Sig Dispense Refill   bacitracin 500 UNIT/GM ointment Apply 1 application topically daily.     Cholecalciferol (VITAMIN D3) 50 MCG (2000 UT) CAPS Take 2,000 Units by mouth daily.     feeding supplement, GLUCERNA SHAKE, (GLUCERNA SHAKE) LIQD Take 237 mLs by mouth 2 (two) times daily between meals. 237 mL 0   ferrous sulfate 325 (65 FE) MG tablet Take 325 mg by mouth daily with  breakfast.     FLUoxetine (PROZAC) 20 MG tablet Take 1 tablet (20 mg total) by mouth daily. 30 tablet 2   furosemide (LASIX) 40 MG tablet Take 2 tablets in the morning, and 1 tablet in the evening. 90 tablet 6   glucose 4 GM chewable tablet Chew 1 tablet (4 g total) by mouth once as needed for low blood sugar. 50 tablet 0   insulin aspart (NOVOLOG) 100 UNIT/ML injection Inject 6 Units into the skin 3 (three) times daily with meals.     Insulin Human (INSULIN PUMP) SOLN Inject into the skin.     levothyroxine (SYNTHROID) 25 MCG tablet Take 1 tablet (25 mcg total) by mouth daily before breakfast. 30 tablet 1   levothyroxine (SYNTHROID) 25 MCG tablet Take 25 mcg by mouth daily before breakfast.     nystatin cream (MYCOSTATIN) APPLY TO AFFECTED AREA TWICE A DAY 30 g 1   potassium chloride (KLOR-CON) 10 MEQ tablet TAKE 1 TABLET BY MOUTH TWICE A DAY 180 tablet 2   pravastatin (PRAVACHOL) 40 MG tablet TAKE 1 TABLET BY MOUTH EVERY DAY 90 tablet 1   sulfamethoxazole-trimethoprim (BACTRIM DS) 800-160 MG tablet Take 1 tablet by mouth 2 (two) times daily. 20 tablet 0   vitamin B-12 (CYANOCOBALAMIN) 1000 MCG tablet Take 1,000 mcg by mouth daily.     No current facility-administered medications for this visit.   Facility-Administered Medications Ordered in Other Visits  Medication Dose Route Frequency Provider Last Rate Last Admin   heparin lock flush 100 unit/mL  500 Units Intravenous Once Lloyd Huger, MD       sodium chloride flush (NS) 0.9 % injection 10 mL  10 mL Intravenous PRN Lloyd Huger, MD   10 mL at 02/01/18 0829   sodium chloride flush (NS) 0.9 % injection 10 mL  10 mL Intravenous PRN Lloyd Huger, MD   10 mL at 04/05/18 0800    OBJECTIVE: Vitals:   12/11/22 1020  BP: (!) 96/57  Pulse: 66  Resp: 18  Temp: 97.9 F (36.6 C)  SpO2: 100%     Body mass index is 37.77 kg/m.    ECOG FS:1 - Symptomatic but completely ambulatory  General: Well-developed, well-nourished,  no acute distress. Eyes: Pink conjunctiva, anicteric sclera. HEENT: Normocephalic, moist mucous membranes. Lungs: No audible wheezing or coughing. Heart: Regular rate and rhythm. Abdomen: Soft, nontender, no obvious distention. Musculoskeletal: No edema, cyanosis, or clubbing. Neuro: Alert, answering all questions appropriately. Cranial nerves grossly intact. Skin: No rashes or petechiae noted. Psych: Normal affect.  LAB RESULTS:  Lab Results  Component Value Date   NA 133 (L) 12/03/2022   K 4.0 12/03/2022   CL 101 12/03/2022   CO2 20 (L) 12/03/2022   GLUCOSE 155 (H) 12/03/2022   BUN 31 (H) 12/03/2022   CREATININE 2.00 (H) 12/03/2022   CALCIUM 7.9 (L) 12/03/2022   PROT 6.5 12/03/2022   ALBUMIN 3.4 (L) 12/03/2022   AST 42 (  H) 12/03/2022   ALT 16 12/03/2022   ALKPHOS 123 12/03/2022   BILITOT 0.5 12/03/2022   GFRNONAA 34 (L) 12/03/2022   GFRAA 41 (L) 01/24/2020    Lab Results  Component Value Date   WBC 4.6 12/03/2022   NEUTROABS 2.4 12/03/2022   HGB 11.0 (L) 12/03/2022   HCT 32.7 (L) 12/03/2022   MCV 93.7 12/03/2022   PLT 65 (L) 12/03/2022   Lab Results  Component Value Date   IRON 13 (L) 08/11/2017   TIBC 223 (L) 01/12/2017   IRONPCTSAT 3.8 (L) 08/11/2017   Lab Results  Component Value Date   FERRITIN 42.0 08/11/2017     STUDIES:  ONCOLOGY HISTORY:  Patient's initial diagnosis was on inguinal lymph node biopsy on July 12, 2014.  Patient was noted to have progressive disease and underwent chemotherapy using Rituxan and Treanda completing cycle 4 on Mar 09, 2018.  CT scan on August 07, 2022 reviewed independently with clearly progressive disease with increasing lymphadenopathy.  PET scan results from August 24, 2022 confirmed recurrent disease.  Patient then underwent 3 additional cycles of Rituxan plus Treanda completing on November 06, 2022.  Treatment was discontinued secondary to declining performance status.  PET scan results from December 10, 2022  revealed an least a very good partial remission.   ASSESSMENT: Recurrent low-grade mantle cell lymphoma. PLAN:    Mantle cell lymphoma, stage III: See oncology history as above.  PET scan results from December 10, 2022 reviewed independently with no obvious evidence of progressive lymphoma.  Official read is pending at time of dictation.  No further chemotherapy is necessary at this time.  Return to clinic in 3 months with repeat imaging using CT scan and further evaluation.  Decreased ejection fraction: Improved.  Patient's most recent cardiac echo in November 2020 revealed an ejection fraction estimated to be 50 to 55%. Continue monitoring and evaluation per cardiology. Hyperglycemia: Chronic and unchanged.  Continue insulin pump as directed. Thrombocytopenia: Patient's most platelet count was 65.  Discontinue treatment as above.   Bradycardia: Patient has pacemaker in place. Renal insufficiency: Chronic and unchanged.  Patient's most recent creatinine is 2.0.  Neither Treanda or Rituxan need to be dose reduced with renal insufficiency. Nausea: Resolved. Anemia: Mild, monitor. Diarrhea: Continue Imodium as needed.   Patient expressed understanding and was in agreement with this plan. He also understands that He can call clinic at any time with any questions, concerns, or complaints.    Lloyd Huger, MD   12/11/2022 12:31 PM

## 2022-12-12 DIAGNOSIS — L97812 Non-pressure chronic ulcer of other part of right lower leg with fat layer exposed: Secondary | ICD-10-CM | POA: Diagnosis not present

## 2022-12-12 DIAGNOSIS — E1151 Type 2 diabetes mellitus with diabetic peripheral angiopathy without gangrene: Secondary | ICD-10-CM | POA: Diagnosis not present

## 2022-12-12 DIAGNOSIS — I872 Venous insufficiency (chronic) (peripheral): Secondary | ICD-10-CM | POA: Diagnosis not present

## 2022-12-12 DIAGNOSIS — L03115 Cellulitis of right lower limb: Secondary | ICD-10-CM | POA: Diagnosis not present

## 2022-12-12 DIAGNOSIS — I890 Lymphedema, not elsewhere classified: Secondary | ICD-10-CM | POA: Diagnosis not present

## 2022-12-12 DIAGNOSIS — L03116 Cellulitis of left lower limb: Secondary | ICD-10-CM | POA: Diagnosis not present

## 2022-12-17 ENCOUNTER — Ambulatory Visit (INDEPENDENT_AMBULATORY_CARE_PROVIDER_SITE_OTHER): Payer: Medicare Other | Admitting: Podiatry

## 2022-12-17 ENCOUNTER — Encounter: Payer: Self-pay | Admitting: Podiatry

## 2022-12-17 DIAGNOSIS — E1151 Type 2 diabetes mellitus with diabetic peripheral angiopathy without gangrene: Secondary | ICD-10-CM

## 2022-12-17 DIAGNOSIS — M79675 Pain in left toe(s): Secondary | ICD-10-CM

## 2022-12-17 DIAGNOSIS — M79674 Pain in right toe(s): Secondary | ICD-10-CM | POA: Diagnosis not present

## 2022-12-17 DIAGNOSIS — B351 Tinea unguium: Secondary | ICD-10-CM

## 2022-12-17 NOTE — Progress Notes (Signed)
This patient returns to my office for at risk foot care.  This patient requires this care by a professional since this patient will be at risk due to having chronic kidney disease and diabetes.  This patient is unable to cut nails himself since the patient cannot reach his nails.These nails are painful walking and wearing shoes. This patient is wearing unna boots on both legs/feet. This patient presents for at risk foot care today.  General Appearance  Alert, conversant and in no acute stress.  Vascular  Deferred due to unna boots.  Neurologic  Deferred due to unna boots.  Nails Thick disfigured discolored nails with subungual debris  from hallux to fifth toes bilaterally. No evidence of bacterial infection or drainage bilaterally.   Orthopedic  No limitations of motion  feet .  No crepitus or effusions noted.  No bony pathology or digital deformities noted.  Skin  normotropic skin with no porokeratosis noted bilaterally.  No signs of infections or ulcers noted.     Onychomycosis  Pain in right toes  Pain in left toes  Consent was obtained for treatment procedures.   Mechanical debridement of nails 1-5  bilaterally performed with a nail nipper.  Filed with dremel without incident. Cauterized digits left foot.   Return office visit    3 months                 Told patient to return for periodic foot care and evaluation due to potential at risk complications.   [name] DPM

## 2022-12-18 DIAGNOSIS — E1151 Type 2 diabetes mellitus with diabetic peripheral angiopathy without gangrene: Secondary | ICD-10-CM | POA: Diagnosis not present

## 2022-12-18 DIAGNOSIS — I890 Lymphedema, not elsewhere classified: Secondary | ICD-10-CM | POA: Diagnosis not present

## 2022-12-18 DIAGNOSIS — L97812 Non-pressure chronic ulcer of other part of right lower leg with fat layer exposed: Secondary | ICD-10-CM | POA: Diagnosis not present

## 2022-12-18 DIAGNOSIS — L03116 Cellulitis of left lower limb: Secondary | ICD-10-CM | POA: Diagnosis not present

## 2022-12-18 DIAGNOSIS — L03115 Cellulitis of right lower limb: Secondary | ICD-10-CM | POA: Diagnosis not present

## 2022-12-18 DIAGNOSIS — I872 Venous insufficiency (chronic) (peripheral): Secondary | ICD-10-CM | POA: Diagnosis not present

## 2022-12-20 ENCOUNTER — Encounter (INDEPENDENT_AMBULATORY_CARE_PROVIDER_SITE_OTHER): Payer: Self-pay | Admitting: Nurse Practitioner

## 2022-12-20 NOTE — Progress Notes (Signed)
Subjective:    [name], male    DOB: 02-13-1946, 77 y.o.   MRN: [id number] Chief Complaint  Patient presents with   Follow-up    Unna wrap check    Gary [name] is a 77 year old male who presents today for follow up and evaluation of his lower extremities due to lymphedema.  The patient had some issues with worsening swelling due to being out of his Unna boots for several weeks prior to Christmas.  Many of the wounds have improved but he still has some wounds and evidence of cellulitis.  The patient unfortunately does not have compression as historically they have been extremely difficult for him to utilize.  During this timeframe he tried to use other methods for compression but they have not been successful.  During this time he has had significant lower extremity edema as well as development of ulcerations and cellulitis.    Review of Systems  Cardiovascular:  Positive for leg swelling.  Skin:  Positive for color change and wound.  All other systems reviewed and are negative.      Objective:   Physical Exam Vitals reviewed.  HENT:     Head: Normocephalic.  Cardiovascular:     Rate and Rhythm: Normal rate.  Pulmonary:     Effort: Pulmonary effort is normal.  Musculoskeletal:     Right lower leg: Edema present.     Left lower leg: Edema present.  Skin:    General: Skin is warm and dry.  Neurological:     Mental Status: He is alert and oriented to person, place, and time.  Psychiatric:        Mood and Affect: Mood normal.        Behavior: Behavior normal.        Thought Content: Thought content normal.        Judgment: Judgment normal.     BP 119/72 (BP Location: Right Arm)   Pulse 99   Resp 18   Wt 235 lb (106.6 kg)   BMI 39.11 kg/m   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    Gastroparesis    Graves  disease    Hypercholesterolemia    Hypertension    Mantle cell lymphoma ([place]) 07/2014   Peripheral neuropathy    Shingles     Social History   Socioeconomic History   Marital status: Widowed    Spouse name: Not on file   Number of children: 1   Years of education: 36   Highest education level: 12th grade  Occupational History   Occupation: Retired  Tobacco Use   Smoking status: Former    Types: Cigarettes    Passive exposure: Past   Smokeless tobacco: Current    Types: Chew  Vaping Use   Vaping Use: Never used  Substance and Sexual Activity   Alcohol use: Yes    Alcohol/week: 3.0 standard drinks of alcohol    Types: 3 Cans of beer per week    Comment: nightly   Drug use: Never   Sexual activity: Not Currently  Other Topics Concern   Not on file  Social History Narrative   Not on file   Social Determinants of Health   Financial Resource Strain: Low Risk  (03/10/2022)   Overall Financial Resource Strain (CARDIA)    Difficulty of Paying  Living Expenses: Not hard at all  Food Insecurity: No Food Insecurity (03/10/2022)   Hunger Vital Sign    Worried About Running Out of Food in the Last Year: Never true    Ran Out of Food in the Last Year: Never true  Transportation Needs: Unmet Transportation Needs (12/11/2022)   PRAPARE - Transportation    Lack of Transportation (Medical): Yes    Lack of Transportation (Non-Medical): Yes  Physical Activity: Inactive (03/10/2022)   Exercise Vital Sign    Days of Exercise per Week: 0 days    Minutes of Exercise per Session: 0 min  Stress: No Stress Concern Present (03/10/2022)   [place]    Feeling of Stress : Only a little  Social Connections: Socially Isolated (03/10/2022)   Social Connection and Isolation Panel [NHANES]    Frequency of Communication with Friends and Family: More than three times a week    Frequency of Social Gatherings with Friends and Family:  Twice a week    Attends Religious Services: Never    Marine scientist or Organizations: No    Attends Archivist Meetings: Never    Marital Status: Widowed  Intimate Partner Violence: Not At Risk (03/10/2022)   Humiliation, Afraid, Rape, and Kick questionnaire    Fear of Current or Ex-Partner: No    Emotionally Abused: No    Physically Abused: No    Sexually Abused: No    Past Surgical History:  Procedure Laterality Date   CHOLECYSTECTOMY     NEPHRECTOMY     right   PACEMAKER INSERTION N/A 12/07/2017   Procedure: INSERTION PACEMAKER DUAL CHAMBER INITIAL INSERT;  Surgeon: Isaias Cowman, MD;  Location: [facility] ORS;  Service: Cardiovascular;  Laterality: N/A;   PORTA CATH INSERTION N/A 11/03/2017   Procedure: PORTA CATH INSERTION;  Surgeon: Katha Cabal, MD;  Location: [facility];  Service: Cardiovascular;  Laterality: N/A;    Family History  Problem Relation Age of Onset   Breast cancer Mother    Diabetes Father    Cancer Father        Lymphoma   Alcohol abuse Maternal Uncle    Diabetes Sister    Heart disease Other        grandfather    Allergies  Allergen Reactions   Rofecoxib Nausea Only       Latest Ref Rng & Units 12/03/2022    8:16 AM 11/05/2022    7:57 AM 10/08/2022    8:19 AM  CBC  WBC 4.0 - 10.5 K/uL 4.6  6.1  4.2   Hemoglobin 13.0 - 17.0 g/dL 11.0  11.2  13.7   Hematocrit 39.0 - 52.0 % 32.7  34.1  38.6   Platelets 150 - 400 K/uL 65  91  81       CMP     Component Value Date/Time   NA 133 (L) 12/03/2022 0816   NA 138 07/15/2014 0515   K 4.0 12/03/2022 0816   K 3.5 01/16/2015 1429   CL 101 12/03/2022 0816   CL 110 (H) 07/15/2014 0515   CO2 20 (L) 12/03/2022 0816   CO2 24 07/15/2014 0515   GLUCOSE 155 (H) 12/03/2022 0816   GLUCOSE 145 (H) 07/15/2014 0515   BUN 31 (H) 12/03/2022 0816   BUN 17 07/15/2014 0515   CREATININE 2.00 (H) 12/03/2022 0816   CREATININE 1.38 (H) 10/17/2021 1624   CALCIUM 7.9 (L) 12/03/2022  [id number]  CALCIUM 7.1 (L) 07/15/2014 0515   PROT 6.5 12/03/2022 0816   PROT 6.2 (L) 10/22/2014 1000   ALBUMIN 3.4 (L) 12/03/2022 0816   ALBUMIN 2.6 (L) 10/22/2014 1000   AST 42 (H) 12/03/2022 0816   AST 19 10/22/2014 1000   ALT 16 12/03/2022 0816   ALT 21 10/22/2014 1000   ALKPHOS 123 12/03/2022 0816   ALKPHOS 118 (H) 10/22/2014 1000   BILITOT 0.5 12/03/2022 0816   BILITOT 0.8 10/22/2014 1000   GFRNONAA 34 (L) 12/03/2022 0816   GFRNONAA >60 01/16/2015 1429   GFRAA 41 (L) 01/24/2020 0925   GFRAA >60 01/16/2015 1429     No results found.     Assessment & Plan:   1. Lymphedema The patient's lymphedema is out of control today due to not having any compression garments for the last several weeks.  Will replace the patient back into Unna boots.  We will continue to change him on a weekly basis in our office.  We will also try to reinstitute home health for the patient.  We also discussed types of compression for the patient we discussed possibly getting a zipper compression which may be a little bit more functional for him.  We also discussed compression wraps which I believe he may fare better with in the long run.  2. Primary hypertension Continue antihypertensive medications as already ordered, these medications have been reviewed and there are no changes at this time.  3. Type 2 diabetes mellitus with diabetic peripheral angiopathy without gangrene, without long-term current use of insulin (HCC) Continue hypoglycemic medications as already ordered, these medications have been reviewed and there are no changes at this time.  Hgb A1C to be monitored as already arranged by primary service  4. Cellulitis of lower extremity, unspecified laterality The patient does have cellulitis which is treated with an antibiotic today.  Will reevaluate at his next follow-up - sulfamethoxazole-trimethoprim (BACTRIM DS) 800-160 MG tablet; Take 1 tablet by mouth 2 (two) times daily.  Dispense: 20  tablet; Refill: 0   Current Outpatient Medications on File Prior to Visit  Medication Sig Dispense Refill   bacitracin 500 UNIT/GM ointment Apply 1 application topically daily.     Cholecalciferol (VITAMIN D3) 50 MCG (2000 UT) CAPS Take 2,000 Units by mouth daily.     feeding supplement, GLUCERNA SHAKE, (GLUCERNA SHAKE) LIQD Take 237 mLs by mouth 2 (two) times daily between meals. 237 mL 0   ferrous sulfate 325 (65 FE) MG tablet Take 325 mg by mouth daily with breakfast.     FLUoxetine (PROZAC) 20 MG tablet Take 1 tablet (20 mg total) by mouth daily. 30 tablet 2   furosemide (LASIX) 40 MG tablet Take 2 tablets in the morning, and 1 tablet in the evening. 90 tablet 6   glucose 4 GM chewable tablet Chew 1 tablet (4 g total) by mouth once as needed for low blood sugar. 50 tablet 0   insulin aspart (NOVOLOG) 100 UNIT/ML injection Inject 6 Units into the skin 3 (three) times daily with meals.     Insulin Human (INSULIN PUMP) SOLN Inject into the skin.     levothyroxine (SYNTHROID) 25 MCG tablet Take 1 tablet (25 mcg total) by mouth daily before breakfast. 30 tablet 1   levothyroxine (SYNTHROID) 25 MCG tablet Take 25 mcg by mouth daily before breakfast.     pravastatin (PRAVACHOL) 40 MG tablet TAKE 1 TABLET BY MOUTH EVERY DAY 90 tablet 1   vitamin B-12 (CYANOCOBALAMIN) 1000 MCG  tablet Take 1,000 mcg by mouth daily.     Current Facility-Administered Medications on File Prior to Visit  Medication Dose Route Frequency Provider Last Rate Last Admin   heparin lock flush 100 unit/mL  500 Units Intravenous Once Lloyd Huger, MD       sodium chloride flush (NS) 0.9 % injection 10 mL  10 mL Intravenous PRN Lloyd Huger, MD   10 mL at 02/01/18 0829   sodium chloride flush (NS) 0.9 % injection 10 mL  10 mL Intravenous PRN Lloyd Huger, MD   10 mL at 04/05/18 0800    There are no Patient Instructions on file for this visit. No follow-ups on file.   Kris Hartmann, NP

## 2022-12-22 ENCOUNTER — Encounter
Admission: RE | Admit: 2022-12-22 | Discharge: 2022-12-22 | Disposition: A | Discharge status: Home / Self Care | Payer: Medicare Other | Source: Ambulatory Visit | Attending: Internal Medicine | Admitting: Internal Medicine

## 2022-12-22 DIAGNOSIS — L97821 Non-pressure chronic ulcer of other part of left lower leg limited to breakdown of skin: Secondary | ICD-10-CM | POA: Diagnosis not present

## 2022-12-22 DIAGNOSIS — Z6836 Body mass index (BMI) 36.0-36.9, adult: Secondary | ICD-10-CM | POA: Diagnosis not present

## 2022-12-22 DIAGNOSIS — Z9181 History of falling: Secondary | ICD-10-CM | POA: Diagnosis not present

## 2022-12-22 DIAGNOSIS — L97812 Non-pressure chronic ulcer of other part of right lower leg with fat layer exposed: Secondary | ICD-10-CM | POA: Diagnosis not present

## 2022-12-22 DIAGNOSIS — R0602 Shortness of breath: Secondary | ICD-10-CM | POA: Diagnosis not present

## 2022-12-22 DIAGNOSIS — K3184 Gastroparesis: Secondary | ICD-10-CM | POA: Diagnosis not present

## 2022-12-22 DIAGNOSIS — C8310 Mantle cell lymphoma, unspecified site: Secondary | ICD-10-CM | POA: Diagnosis not present

## 2022-12-22 DIAGNOSIS — Z950 Presence of cardiac pacemaker: Secondary | ICD-10-CM | POA: Diagnosis not present

## 2022-12-22 DIAGNOSIS — I509 Heart failure, unspecified: Secondary | ICD-10-CM | POA: Diagnosis not present

## 2022-12-22 DIAGNOSIS — E7800 Pure hypercholesterolemia, unspecified: Secondary | ICD-10-CM | POA: Diagnosis not present

## 2022-12-22 DIAGNOSIS — I2089 Other forms of angina pectoris: Secondary | ICD-10-CM | POA: Diagnosis not present

## 2022-12-22 DIAGNOSIS — E1142 Type 2 diabetes mellitus with diabetic polyneuropathy: Secondary | ICD-10-CM | POA: Diagnosis not present

## 2022-12-22 DIAGNOSIS — Z87891 Personal history of nicotine dependence: Secondary | ICD-10-CM | POA: Diagnosis not present

## 2022-12-22 DIAGNOSIS — E11319 Type 2 diabetes mellitus with unspecified diabetic retinopathy without macular edema: Secondary | ICD-10-CM | POA: Diagnosis not present

## 2022-12-22 DIAGNOSIS — Z9641 Presence of insulin pump (external) (internal): Secondary | ICD-10-CM | POA: Diagnosis not present

## 2022-12-22 DIAGNOSIS — I872 Venous insufficiency (chronic) (peripheral): Secondary | ICD-10-CM | POA: Diagnosis not present

## 2022-12-22 DIAGNOSIS — E0500 Thyrotoxicosis with diffuse goiter without thyrotoxic crisis or storm: Secondary | ICD-10-CM | POA: Diagnosis not present

## 2022-12-22 DIAGNOSIS — E1151 Type 2 diabetes mellitus with diabetic peripheral angiopathy without gangrene: Secondary | ICD-10-CM | POA: Diagnosis not present

## 2022-12-22 DIAGNOSIS — E1143 Type 2 diabetes mellitus with diabetic autonomic (poly)neuropathy: Secondary | ICD-10-CM | POA: Diagnosis not present

## 2022-12-22 DIAGNOSIS — I110 Hypertensive heart disease with heart failure: Secondary | ICD-10-CM | POA: Diagnosis not present

## 2022-12-22 DIAGNOSIS — Z794 Long term (current) use of insulin: Secondary | ICD-10-CM | POA: Diagnosis not present

## 2022-12-22 DIAGNOSIS — F32A Depression, unspecified: Secondary | ICD-10-CM | POA: Diagnosis not present

## 2022-12-22 DIAGNOSIS — I890 Lymphedema, not elsewhere classified: Secondary | ICD-10-CM | POA: Diagnosis not present

## 2022-12-22 DIAGNOSIS — Z905 Acquired absence of kidney: Secondary | ICD-10-CM | POA: Diagnosis not present

## 2022-12-22 MED ORDER — TECHNETIUM TC 99M TETROFOSMIN IV KIT: 10.0000 | PACK | Freq: Once | INTRAVENOUS | Status: DC | PRN

## 2022-12-23 ENCOUNTER — Encounter (INDEPENDENT_AMBULATORY_CARE_PROVIDER_SITE_OTHER): Payer: Self-pay | Admitting: Nurse Practitioner

## 2022-12-23 ENCOUNTER — Ambulatory Visit (INDEPENDENT_AMBULATORY_CARE_PROVIDER_SITE_OTHER): Payer: Medicare Other | Admitting: Nurse Practitioner

## 2022-12-23 ENCOUNTER — Telehealth: Payer: Self-pay | Admitting: Internal Medicine

## 2022-12-23 VITALS — BP 85/56 | HR 114 | Resp 18 | Ht 70.0 in | Wt 221.0 lb

## 2022-12-23 DIAGNOSIS — I890 Lymphedema, not elsewhere classified: Secondary | ICD-10-CM

## 2022-12-23 DIAGNOSIS — I5022 Chronic systolic (congestive) heart failure: Secondary | ICD-10-CM

## 2022-12-23 NOTE — Telephone Encounter (Signed)
LMTCB

## 2022-12-23 NOTE — Telephone Encounter (Signed)
Pt sister Gary [name] called in staying that yesterday when pt went for his strep test appt, pt fell off  getting off the transit transportation, and that his bp was 91/31,(test was not done due to the bp) and today when he tried to go back for his appt they check bp again and it was 88/35.susan was wondering Dr. Nicki Reaper can see him today, because she's concern about him or what can she recommend for her to do? ED, UC?? She would like to F/U today, she's available '@[phone]'$ [phone] or her home number.

## 2022-12-23 NOTE — Telephone Encounter (Signed)
LM for patient and sister

## 2022-12-24 NOTE — Telephone Encounter (Signed)
FYI  Called patient to check in. Patient has been trying have a stress test done but BP has been low. Cardiology is going to try again tomorrow. Patient confirmed today that he is feeling good, eating drinking, etc. Denies symptoms of dizziness, faint feeling, etc. He did have a fall but said that he tripped on the ramp at his house trying to go to an appointment. No injuries noted. Did not hit his head (pt reported). Has a follow up with you in a few weeks (01/12/23)

## 2022-12-25 DIAGNOSIS — R0789 Other chest pain: Secondary | ICD-10-CM | POA: Diagnosis not present

## 2022-12-25 MED ORDER — REGADENOSON 0.4 MG/5ML IV SOLN
0.4000 mg | Freq: Once | INTRAVENOUS | Status: AC
  Administered 2022-12-25: 0.4 mg via INTRAVENOUS

## 2022-12-25 MED ORDER — TECHNETIUM TC 99M TETROFOSMIN IV KIT
30.0000 | PACK | Freq: Once | INTRAVENOUS | Status: AC | PRN
  Administered 2022-12-25: 32.94 via INTRAVENOUS

## 2022-12-25 MED ORDER — TECHNETIUM TC 99M TETROFOSMIN IV KIT
10.5900 | PACK | Freq: Once | INTRAVENOUS | Status: AC | PRN
  Administered 2022-12-22: 10.59 via INTRAVENOUS

## 2022-12-25 NOTE — Telephone Encounter (Signed)
Per discussion with Larena Glassman, cardiology is aware of his blood pressures. He is now seeing Dr Clayborn Bigness at [facility]. (Was seeing Dr Nehemiah Massed, but he moved).  Need to confirm cardiology aware and that he has a f/u appt with them to discuss medication adjustment, etc.  See if he has checked blood pressure today.  Any acute changes, needs to be seen.

## 2022-12-25 NOTE — Telephone Encounter (Signed)
Left message for patient to call office.Reached out to Dr. Marianna Payment office stress test has not been scheduled yet, Advised nurse of BP and of patient fall, they are calling patient for workin appt. Will check back for BP on patient.

## 2022-12-25 NOTE — Telephone Encounter (Signed)
Patient stated BP 106/80 today and he is returning the call to Dr. Clayborn Bigness office to get the FU appt they had called him he missed the call.  Patient stated he did have stress test today.  Viewable under media in chart.

## 2022-12-25 NOTE — Telephone Encounter (Signed)
Noted.  Thank you for checking on this, contacting cardiology and the f/u.

## 2022-12-27 NOTE — Telephone Encounter (Signed)
Blood pressure was better on last check.  Seeing cardiology and in process of work up.  Full results not available.  Please confirm that Gary [name] and his blood pressure are still doing ok and confirm has f/u scheduled with cardiology.

## 2022-12-28 NOTE — Telephone Encounter (Signed)
Spoke with pt.  He reported that his BP has been better, 116/61 today.  Denies any dizziness or further falls.  He reports that he has an appointment with Cardiology 12/30/22 but I cannot see it in EPIC.  I do see an echo appointment on 12/08/22.

## 2022-12-29 DIAGNOSIS — I495 Sick sinus syndrome: Secondary | ICD-10-CM | POA: Diagnosis not present

## 2022-12-29 NOTE — Telephone Encounter (Signed)
Left detailed message for pt letting him know to keep cardiology appt and to call if he needs [place].

## 2022-12-29 NOTE — Telephone Encounter (Signed)
Noted.  Blood pressure better.  Keep cardiology appt. Call if problems.

## 2022-12-30 ENCOUNTER — Encounter (INDEPENDENT_AMBULATORY_CARE_PROVIDER_SITE_OTHER): Payer: Medicare Other

## 2022-12-30 DIAGNOSIS — I429 Cardiomyopathy, unspecified: Secondary | ICD-10-CM | POA: Diagnosis not present

## 2022-12-30 DIAGNOSIS — E1069 Type 1 diabetes mellitus with other specified complication: Secondary | ICD-10-CM | POA: Diagnosis not present

## 2022-12-30 DIAGNOSIS — R001 Bradycardia, unspecified: Secondary | ICD-10-CM | POA: Diagnosis not present

## 2022-12-30 DIAGNOSIS — E785 Hyperlipidemia, unspecified: Secondary | ICD-10-CM | POA: Diagnosis not present

## 2022-12-30 DIAGNOSIS — I2510 Atherosclerotic heart disease of native coronary artery without angina pectoris: Secondary | ICD-10-CM | POA: Diagnosis not present

## 2022-12-30 DIAGNOSIS — I495 Sick sinus syndrome: Secondary | ICD-10-CM | POA: Diagnosis not present

## 2022-12-30 DIAGNOSIS — R634 Abnormal weight loss: Secondary | ICD-10-CM | POA: Diagnosis not present

## 2022-12-30 DIAGNOSIS — E1159 Type 2 diabetes mellitus with other circulatory complications: Secondary | ICD-10-CM | POA: Diagnosis not present

## 2022-12-30 DIAGNOSIS — I152 Hypertension secondary to endocrine disorders: Secondary | ICD-10-CM | POA: Diagnosis not present

## 2022-12-30 DIAGNOSIS — N1832 Chronic kidney disease, stage 3b: Secondary | ICD-10-CM | POA: Diagnosis not present

## 2022-12-30 DIAGNOSIS — E10649 Type 1 diabetes mellitus with hypoglycemia without coma: Secondary | ICD-10-CM | POA: Diagnosis not present

## 2022-12-30 DIAGNOSIS — I5022 Chronic systolic (congestive) heart failure: Secondary | ICD-10-CM | POA: Diagnosis not present

## 2022-12-30 LAB — NM MYOCAR MULTI W/SPECT W/WALL MOTION / EF
LV dias vol: 93 mL (ref 62–150)
LV sys vol: 30 mL
Nuc Stress EF: 68 %
Rest Nuclear Isotope Dose: 10.6 mCi
SDS: 1
SRS: 1
SSS: 2
ST Depression (mm): 0 mm
Stress Nuclear Isotope Dose: 32.9 mCi
TID: 1.19

## 2022-12-31 ENCOUNTER — Ambulatory Visit: Payer: 59

## 2022-12-31 ENCOUNTER — Other Ambulatory Visit: Payer: 59

## 2022-12-31 ENCOUNTER — Ambulatory Visit: Payer: 59 | Admitting: Oncology

## 2022-12-31 DIAGNOSIS — I110 Hypertensive heart disease with heart failure: Secondary | ICD-10-CM | POA: Diagnosis not present

## 2022-12-31 DIAGNOSIS — I890 Lymphedema, not elsewhere classified: Secondary | ICD-10-CM | POA: Diagnosis not present

## 2022-12-31 DIAGNOSIS — L97812 Non-pressure chronic ulcer of other part of right lower leg with fat layer exposed: Secondary | ICD-10-CM | POA: Diagnosis not present

## 2022-12-31 DIAGNOSIS — I872 Venous insufficiency (chronic) (peripheral): Secondary | ICD-10-CM | POA: Diagnosis not present

## 2022-12-31 DIAGNOSIS — E1151 Type 2 diabetes mellitus with diabetic peripheral angiopathy without gangrene: Secondary | ICD-10-CM | POA: Diagnosis not present

## 2022-12-31 DIAGNOSIS — L97821 Non-pressure chronic ulcer of other part of left lower leg limited to breakdown of skin: Secondary | ICD-10-CM | POA: Diagnosis not present

## 2023-01-01 ENCOUNTER — Ambulatory Visit: Payer: 59

## 2023-01-01 DIAGNOSIS — R0602 Shortness of breath: Secondary | ICD-10-CM | POA: Diagnosis not present

## 2023-01-01 DIAGNOSIS — I2089 Other forms of angina pectoris: Secondary | ICD-10-CM | POA: Diagnosis not present

## 2023-01-05 ENCOUNTER — Encounter (INDEPENDENT_AMBULATORY_CARE_PROVIDER_SITE_OTHER): Payer: Self-pay | Admitting: Nurse Practitioner

## 2023-01-05 DIAGNOSIS — E1151 Type 2 diabetes mellitus with diabetic peripheral angiopathy without gangrene: Secondary | ICD-10-CM | POA: Diagnosis not present

## 2023-01-05 DIAGNOSIS — I890 Lymphedema, not elsewhere classified: Secondary | ICD-10-CM | POA: Diagnosis not present

## 2023-01-05 DIAGNOSIS — L97812 Non-pressure chronic ulcer of other part of right lower leg with fat layer exposed: Secondary | ICD-10-CM | POA: Diagnosis not present

## 2023-01-05 DIAGNOSIS — L97821 Non-pressure chronic ulcer of other part of left lower leg limited to breakdown of skin: Secondary | ICD-10-CM | POA: Diagnosis not present

## 2023-01-05 DIAGNOSIS — I872 Venous insufficiency (chronic) (peripheral): Secondary | ICD-10-CM | POA: Diagnosis not present

## 2023-01-05 DIAGNOSIS — I110 Hypertensive heart disease with heart failure: Secondary | ICD-10-CM | POA: Diagnosis not present

## 2023-01-05 NOTE — Progress Notes (Signed)
Subjective:    [name], male    DOB: 04/27/1946, 77 y.o.   MRN: [id number] Chief Complaint  Patient presents with   Follow-up    [name] boot check    Gary [name] is a 77 year old male who returns today for evaluation of his bilateral lower extremity edema.  The patient had some recent issues with his occluded wraps but they seem to be uncontrolled today.  No recurrence of cellulitis.  No new open wounds, blisters or ulcerations.  He is having some issues with his blood pressure.  He notes that he will be following up with cardiology soon for studies and evaluation.    Review of Systems  Cardiovascular:  Positive for leg swelling.  All other systems reviewed and are negative.      Objective:   Physical Exam Vitals reviewed.  HENT:     Head: Normocephalic.  Cardiovascular:     Rate and Rhythm: Tachycardia present.  Pulmonary:     Effort: Tachypnea present.  Musculoskeletal:     Right lower leg: Edema present.     Left lower leg: Edema present.  Skin:    General: Skin is warm and dry.  Neurological:     Mental Status: He is alert and oriented to person, place, and time.     Motor: Weakness present.     Gait: Gait abnormal.  Psychiatric:        Mood and Affect: Mood normal.        Behavior: Behavior normal.        Thought Content: Thought content normal.        Judgment: Judgment normal.     BP (!) 85/56 (BP Location: Right Arm)   Pulse (!) 114   Resp 18   Ht 5\' 10"  (1.778 m)   Wt 221 lb (100.2 kg)   BMI 31.71 kg/m   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    Gastroparesis    Graves disease    Hypercholesterolemia    Hypertension    Mantle cell lymphoma ([place]) 07/2014   Peripheral neuropathy    Shingles     Social History   Socioeconomic History   Marital status: Widowed    Spouse name: Not on  file   Number of children: 1   Years of education: 61   Highest education level: 12th grade  Occupational History   Occupation: Retired  Tobacco Use   Smoking status: Former    Types: Cigarettes    Passive exposure: Past   Smokeless tobacco: Current    Types: Chew  Vaping Use   Vaping Use: Never used  Substance and Sexual Activity   Alcohol use: Yes    Alcohol/week: 3.0 standard drinks of alcohol    Types: 3 Cans of beer per week    Comment: nightly   Drug use: Never   Sexual activity: Not Currently  Other Topics Concern   Not on file  Social History Narrative   Not on file   Social Determinants of Health   Financial Resource Strain: Low Risk  (03/10/2022)   Overall Financial Resource Strain (CARDIA)    Difficulty of Paying Living Expenses: Not hard at all  Food Insecurity: No Food Insecurity (03/10/2022)   Hunger Vital Sign    Worried About Running Out of Food in the Last Year:  Never true    Ran Out of Food in the Last Year: Never true  Transportation Needs: Unmet Transportation Needs (12/11/2022)   PRAPARE - Transportation    Lack of Transportation (Medical): Yes    Lack of Transportation (Non-Medical): Yes  Physical Activity: Inactive (03/10/2022)   Exercise Vital Sign    Days of Exercise per Week: 0 days    Minutes of Exercise per Session: 0 min  Stress: No Stress Concern Present (03/10/2022)   [place]    Feeling of Stress : Only a little  Social Connections: Socially Isolated (03/10/2022)   Social Connection and Isolation Panel [NHANES]    Frequency of Communication with Friends and Family: More than three times a week    Frequency of Social Gatherings with Friends and Family: Twice a week    Attends Religious Services: Never    Marine scientist or Organizations: No    Attends Archivist Meetings: Never    Marital Status: Widowed  Intimate Partner Violence: Not At Risk (03/10/2022)    Humiliation, Afraid, Rape, and Kick questionnaire    Fear of Current or Ex-Partner: No    Emotionally Abused: No    Physically Abused: No    Sexually Abused: No    Past Surgical History:  Procedure Laterality Date   CHOLECYSTECTOMY     NEPHRECTOMY     right   PACEMAKER INSERTION N/A 12/07/2017   Procedure: INSERTION PACEMAKER DUAL CHAMBER INITIAL INSERT;  Surgeon: Isaias Cowman, MD;  Location: [facility] ORS;  Service: Cardiovascular;  Laterality: N/A;   PORTA CATH INSERTION N/A 11/03/2017   Procedure: PORTA CATH INSERTION;  Surgeon: Katha Cabal, MD;  Location: [place] [facility];  Service: Cardiovascular;  Laterality: N/A;    Family History  Problem Relation Age of Onset   Breast cancer Mother    Diabetes Father    Cancer Father        Lymphoma   Alcohol abuse Maternal Uncle    Diabetes Sister    Heart disease Other        grandfather    Allergies  Allergen Reactions   Rofecoxib Nausea Only       Latest Ref Rng & Units 12/03/2022    8:16 AM 11/05/2022    7:57 AM 10/08/2022    8:19 AM  CBC  WBC 4.0 - 10.5 K/uL 4.6  6.1  4.2   Hemoglobin 13.0 - 17.0 g/dL 11.0  11.2  13.7   Hematocrit 39.0 - 52.0 % 32.7  34.1  38.6   Platelets 150 - 400 K/uL 65  91  81       CMP     Component Value Date/Time   NA 133 (L) 12/03/2022 0816   NA 138 07/15/2014 0515   K 4.0 12/03/2022 0816   K 3.5 01/16/2015 1429   CL 101 12/03/2022 0816   CL 110 (H) 07/15/2014 0515   CO2 20 (L) 12/03/2022 0816   CO2 24 07/15/2014 0515   GLUCOSE 155 (H) 12/03/2022 0816   GLUCOSE 145 (H) 07/15/2014 0515   BUN 31 (H) 12/03/2022 0816   BUN 17 07/15/2014 0515   CREATININE 2.00 (H) 12/03/2022 0816   CREATININE 1.38 (H) 10/17/2021 1624   CALCIUM 7.9 (L) 12/03/2022 0816   CALCIUM 7.1 (L) 07/15/2014 0515   PROT 6.5 12/03/2022 0816   PROT 6.2 (L) 10/22/2014 1000   ALBUMIN 3.4 (L) 12/03/2022 0816   ALBUMIN 2.6 (L) 10/22/2014  1000   AST 42 (H) 12/03/2022 0816   AST 19 10/22/2014 1000    ALT 16 12/03/2022 0816   ALT 21 10/22/2014 1000   ALKPHOS 123 12/03/2022 0816   ALKPHOS 118 (H) 10/22/2014 1000   BILITOT 0.5 12/03/2022 0816   BILITOT 0.8 10/22/2014 1000   GFRNONAA 34 (L) 12/03/2022 0816   GFRNONAA >60 01/16/2015 1429   GFRAA 41 (L) 01/24/2020 0925   GFRAA >60 01/16/2015 1429     No results found.     Assessment & Plan:   1. Lymphedema The patient is tolerating the Unna boots well.  No worsening wounds or ulcerations.  He will continue with home health.  Will see the patient in 4 weeks for reevaluation.  2. Chronic systolic CHF (congestive heart failure) ([place]) The patient has some hypotension today with tachycardia.  He is also notably short of breath.  The patient is approved to visit the emergency room and we have offered to the emergency room uses today.  He is advised that if he EMS for emergency   Current Outpatient Medications on File Prior to Visit  Medication Sig Dispense Refill   bacitracin 500 UNIT/GM ointment Apply 1 application topically daily.     Cholecalciferol (VITAMIN D3) 50 MCG (2000 UT) CAPS Take 2,000 Units by mouth daily.     feeding supplement, GLUCERNA SHAKE, (GLUCERNA SHAKE) LIQD Take 237 mLs by mouth 2 (two) times daily between meals. 237 mL 0   ferrous sulfate 325 (65 FE) MG tablet Take 325 mg by mouth daily with breakfast.     FLUoxetine (PROZAC) 20 MG tablet Take 1 tablet (20 mg total) by mouth daily. 30 tablet 2   furosemide (LASIX) 40 MG tablet Take 2 tablets in the morning, and 1 tablet in the evening. 90 tablet 6   glucose 4 GM chewable tablet Chew 1 tablet (4 g total) by mouth once as needed for low blood sugar. 50 tablet 0   insulin aspart (NOVOLOG) 100 UNIT/ML injection Inject 6 Units into the skin 3 (three) times daily with meals.     Insulin Human (INSULIN PUMP) SOLN Inject into the skin.     levothyroxine (SYNTHROID) 25 MCG tablet Take 1 tablet (25 mcg total) by mouth daily before breakfast. 30 tablet 1   levothyroxine  (SYNTHROID) 25 MCG tablet Take 25 mcg by mouth daily before breakfast.     nystatin cream (MYCOSTATIN) APPLY TO AFFECTED AREA TWICE A DAY 30 g 1   potassium chloride (KLOR-CON) 10 MEQ tablet TAKE 1 TABLET BY MOUTH TWICE A DAY 180 tablet 2   pravastatin (PRAVACHOL) 40 MG tablet TAKE 1 TABLET BY MOUTH EVERY DAY 90 tablet 1   sulfamethoxazole-trimethoprim (BACTRIM DS) 800-160 MG tablet Take 1 tablet by mouth 2 (two) times daily. 20 tablet 0   vitamin B-12 (CYANOCOBALAMIN) 1000 MCG tablet Take 1,000 mcg by mouth daily.     Current Facility-Administered Medications on File Prior to Visit  Medication Dose Route Frequency Provider Last Rate Last Admin   heparin lock flush 100 unit/mL  500 Units Intravenous Once Lloyd Huger, MD       sodium chloride flush (NS) 0.9 % injection 10 mL  10 mL Intravenous PRN Lloyd Huger, MD   10 mL at 02/01/18 0829   sodium chloride flush (NS) 0.9 % injection 10 mL  10 mL Intravenous PRN Lloyd Huger, MD   10 mL at 04/05/18 0800    There are no Patient Instructions on  file for this visit. No follow-ups on file.   Kris Hartmann, NP

## 2023-01-06 ENCOUNTER — Encounter (INDEPENDENT_AMBULATORY_CARE_PROVIDER_SITE_OTHER): Payer: Medicare Other

## 2023-01-06 ENCOUNTER — Telehealth: Payer: Self-pay | Admitting: *Deleted

## 2023-01-06 ENCOUNTER — Ambulatory Visit: Admission: RE | Admit: 2023-01-06 | Payer: 59 | Source: Ambulatory Visit

## 2023-01-06 DIAGNOSIS — R112 Nausea with vomiting, unspecified: Secondary | ICD-10-CM

## 2023-01-06 DIAGNOSIS — R531 Weakness: Secondary | ICD-10-CM

## 2023-01-06 DIAGNOSIS — C8318 Mantle cell lymphoma, lymph nodes of multiple sites: Secondary | ICD-10-CM

## 2023-01-06 NOTE — Telephone Encounter (Signed)
Patient sister called concerned about patient who is getting progressively worse since stopping his infusions of Rituxan and Treanda. He has nausea, vomiting  and weight loss having weighed 244 # 3 weeks ago, he now weighs 213 #. He is not eating much is weaker and in general does not feel well. He has been evaluated by cardiology and was found to have hypotension and advised to get treatment and have an echo done, but he did not do this due to transportation issues. She is asking if we can do anything for the patient or not. Please return call to her as his POA for any recommendations.

## 2023-01-07 ENCOUNTER — Ambulatory Visit: Payer: 59 | Admitting: Oncology

## 2023-01-07 ENCOUNTER — Other Ambulatory Visit: Payer: Self-pay

## 2023-01-07 ENCOUNTER — Inpatient Hospital Stay (HOSPITAL_BASED_OUTPATIENT_CLINIC_OR_DEPARTMENT_OTHER): Payer: Medicare Other | Admitting: Hospice and Palliative Medicine

## 2023-01-07 ENCOUNTER — Encounter: Payer: Self-pay | Admitting: Hospice and Palliative Medicine

## 2023-01-07 ENCOUNTER — Ambulatory Visit: Payer: 59

## 2023-01-07 ENCOUNTER — Other Ambulatory Visit: Payer: 59

## 2023-01-07 ENCOUNTER — Inpatient Hospital Stay: Payer: Medicare Other

## 2023-01-07 VITALS — BP 106/51 | HR 59

## 2023-01-07 VITALS — BP 61/48 | HR 53 | Temp 97.8°F | Resp 22 | Ht 70.0 in | Wt 201.0 lb

## 2023-01-07 DIAGNOSIS — E1165 Type 2 diabetes mellitus with hyperglycemia: Secondary | ICD-10-CM | POA: Diagnosis not present

## 2023-01-07 DIAGNOSIS — E860 Dehydration: Secondary | ICD-10-CM | POA: Diagnosis not present

## 2023-01-07 DIAGNOSIS — E876 Hypokalemia: Secondary | ICD-10-CM

## 2023-01-07 DIAGNOSIS — R197 Diarrhea, unspecified: Secondary | ICD-10-CM

## 2023-01-07 DIAGNOSIS — R112 Nausea with vomiting, unspecified: Secondary | ICD-10-CM

## 2023-01-07 DIAGNOSIS — C8318 Mantle cell lymphoma, lymph nodes of multiple sites: Secondary | ICD-10-CM

## 2023-01-07 DIAGNOSIS — I130 Hypertensive heart and chronic kidney disease with heart failure and stage 1 through stage 4 chronic kidney disease, or unspecified chronic kidney disease: Secondary | ICD-10-CM | POA: Diagnosis not present

## 2023-01-07 DIAGNOSIS — R531 Weakness: Secondary | ICD-10-CM

## 2023-01-07 DIAGNOSIS — I951 Orthostatic hypotension: Secondary | ICD-10-CM

## 2023-01-07 DIAGNOSIS — C8315 Mantle cell lymphoma, lymph nodes of inguinal region and lower limb: Secondary | ICD-10-CM | POA: Diagnosis not present

## 2023-01-07 DIAGNOSIS — Z95828 Presence of other vascular implants and grafts: Secondary | ICD-10-CM

## 2023-01-07 LAB — LACTATE DEHYDROGENASE: LDH: 206 U/L — ABNORMAL HIGH (ref 98–192)

## 2023-01-07 LAB — CBC WITH DIFFERENTIAL (CANCER CENTER ONLY)
Abs Immature Granulocytes: 0.2 10*3/uL — ABNORMAL HIGH (ref 0.00–0.07)
Basophils Absolute: 0 10*3/uL (ref 0.0–0.1)
Basophils Relative: 1 %
Eosinophils Absolute: 0 10*3/uL (ref 0.0–0.5)
Eosinophils Relative: 1 %
HCT: 34 % — ABNORMAL LOW (ref 39.0–52.0)
Hemoglobin: 11.5 g/dL — ABNORMAL LOW (ref 13.0–17.0)
Immature Granulocytes: 7 %
Lymphocytes Relative: 27 %
Lymphs Abs: 0.8 10*3/uL (ref 0.7–4.0)
MCH: 30.7 pg (ref 26.0–34.0)
MCHC: 33.8 g/dL (ref 30.0–36.0)
MCV: 90.7 fL (ref 80.0–100.0)
Monocytes Absolute: 0.4 10*3/uL (ref 0.1–1.0)
Monocytes Relative: 14 %
Neutro Abs: 1.6 10*3/uL — ABNORMAL LOW (ref 1.7–7.7)
Neutrophils Relative %: 50 %
Platelet Count: 77 10*3/uL — ABNORMAL LOW (ref 150–400)
RBC: 3.75 MIL/uL — ABNORMAL LOW (ref 4.22–5.81)
RDW: 14.4 % (ref 11.5–15.5)
Smear Review: NORMAL
WBC Count: 3.1 10*3/uL — ABNORMAL LOW (ref 4.0–10.5)
nRBC: 0 % (ref 0.0–0.2)

## 2023-01-07 LAB — CMP (CANCER CENTER ONLY)
ALT: 8 U/L (ref 0–44)
AST: 22 U/L (ref 15–41)
Albumin: 3.3 g/dL — ABNORMAL LOW (ref 3.5–5.0)
Alkaline Phosphatase: 90 U/L (ref 38–126)
Anion gap: 12 (ref 5–15)
BUN: 29 mg/dL — ABNORMAL HIGH (ref 8–23)
CO2: 22 mmol/L (ref 22–32)
Calcium: 7.7 mg/dL — ABNORMAL LOW (ref 8.9–10.3)
Chloride: 102 mmol/L (ref 98–111)
Creatinine: 1.88 mg/dL — ABNORMAL HIGH (ref 0.61–1.24)
GFR, Estimated: 37 mL/min — ABNORMAL LOW (ref >60)
Glucose, Bld: 65 mg/dL — ABNORMAL LOW (ref 70–99)
Potassium: 2.8 mmol/L — ABNORMAL LOW (ref 3.5–5.1)
Sodium: 136 mmol/L (ref 135–145)
Total Bilirubin: 0.4 mg/dL (ref 0.3–1.2)
Total Protein: 6.3 g/dL — ABNORMAL LOW (ref 6.5–8.1)

## 2023-01-07 LAB — MAGNESIUM: Magnesium: 1.7 mg/dL (ref 1.7–2.4)

## 2023-01-07 MED ORDER — SODIUM CHLORIDE 0.9 % IV SOLN
INTRAVENOUS | Status: DC
  Filled 2023-01-07 (×2): qty 250

## 2023-01-07 MED ORDER — SODIUM CHLORIDE 0.9% FLUSH
10.0000 mL | Freq: Once | INTRAVENOUS | Status: AC
  Administered 2023-01-07: 10 mL via INTRAVENOUS
  Filled 2023-01-07: qty 10

## 2023-01-07 MED ORDER — POTASSIUM CHLORIDE 20 MEQ/100ML IV SOLN
20.0000 meq | Freq: Once | INTRAVENOUS | Status: AC
  Administered 2023-01-07: 20 meq via INTRAVENOUS

## 2023-01-07 MED ORDER — HEPARIN SOD (PORK) LOCK FLUSH 100 UNIT/ML IV SOLN
500.0000 [IU] | Freq: Once | INTRAVENOUS | Status: AC
  Administered 2023-01-07: 500 [IU]
  Filled 2023-01-07: qty 5

## 2023-01-07 NOTE — Progress Notes (Signed)
Symptom Management [place] at [facility] [phone]) [phone] [phone]  Patient Care Team: [name], MD as PCP - General (Internal Medicine) Lloyd Huger, MD as Consulting Physician (Oncology) Lucky Cowboy Erskine Squibb, MD as Referring Physician (Vascular Surgery) Schnier, Dolores Lory, MD (Vascular Surgery) Corey Skains, MD as Consulting Physician (Cardiology)   NAME OF PATIENT: Gary [name]  [id number]  November 19, 1945   DATE OF VISIT: 01/07/23  REASON FOR CONSULT: Gary [name] is a 77 y.o. male with multiple medical problems including recurrent stage III mantle cell lymphoma who completed 43cycles of Rituxan and Treanda with PET scan 12/10/2022 showing significant interval disease improvement.   INTERVAL HISTORY: Patient presents [facility] today for evaluation of weakness, fatigue, and 2 months of diarrhea.  Patient reports that he started having diarrhea about the time of the last cancer treatment in January.  He reports persistent diarrhea since with up to 5-6 diarrheal stools a day.  He describes it as watery and nonbloody.  No identifiable triggers.  He reports reduced oral intake with minimal food/fluid over the past several days.  He has been unable to take his oral potassium as his triggers nausea.  He has had nausea but no vomiting.  Denies any abdominal pain.  No shortness of breath or chest pain.  Denies any neurologic complaints. Denies recent fevers or illnesses. Denies any easy bleeding or bruising.  Denies urinary complaints. Patient offers no further specific complaints today.   PAST MEDICAL HISTORY: Past Medical History:  Diagnosis Date   CHF (congestive heart failure) ([place])    Depression    Diabetes mellitus due to underlying condition with diabetic retinopathy with macular edema    Gastroparesis    Graves disease    Hypercholesterolemia    Hypertension    Mantle cell lymphoma ([place]) 07/2014   Peripheral neuropathy     Shingles     PAST SURGICAL HISTORY:  Past Surgical History:  Procedure Laterality Date   CHOLECYSTECTOMY     NEPHRECTOMY     right   PACEMAKER INSERTION N/A 12/07/2017   Procedure: INSERTION PACEMAKER DUAL CHAMBER INITIAL INSERT;  Surgeon: Isaias Cowman, MD;  Location: [facility] ORS;  Service: Cardiovascular;  Laterality: N/A;   PORTA CATH INSERTION N/A 11/03/2017   Procedure: PORTA CATH INSERTION;  Surgeon: Katha Cabal, MD;  Location: [place] CV LAB;  Service: Cardiovascular;  Laterality: N/A;    HEMATOLOGY/ONCOLOGY HISTORY:  Oncology History  Mantle cell lymphoma ([place])  08/03/2014 Initial Diagnosis   Mantle cell lymphoma ([place])   09/10/2022 - 11/06/2022 Chemotherapy   Patient is on Treatment Plan : NON-HODGKINS LYMPHOMA Rituximab D1 + Bendamustine D1,2 q28d x 6 cycles       ALLERGIES:  is allergic to rofecoxib.  MEDICATIONS:  Current Outpatient Medications  Medication Sig Dispense Refill   bacitracin 500 UNIT/GM ointment Apply 1 application topically daily.     Cholecalciferol (VITAMIN D3) 50 MCG (2000 UT) CAPS Take 2,000 Units by mouth daily.     feeding supplement, GLUCERNA SHAKE, (GLUCERNA SHAKE) LIQD Take 237 mLs by mouth 2 (two) times daily between meals. 237 mL 0   ferrous sulfate 325 (65 FE) MG tablet Take 325 mg by mouth daily with breakfast.     FLUoxetine (PROZAC) 20 MG tablet Take 1 tablet (20 mg total) by mouth daily. 30 tablet 2   furosemide (LASIX) 40 MG tablet Take 2 tablets in the morning, and 1 tablet in the evening. 90 tablet 6  glucose 4 GM chewable tablet Chew 1 tablet (4 g total) by mouth once as needed for low blood sugar. 50 tablet 0   insulin aspart (NOVOLOG) 100 UNIT/ML injection Inject 6 Units into the skin 3 (three) times daily with meals.     Insulin Human (INSULIN PUMP) SOLN Inject into the skin.     levothyroxine (SYNTHROID) 25 MCG tablet Take 1 tablet (25 mcg total) by mouth daily before breakfast. 30 tablet 1   levothyroxine  (SYNTHROID) 25 MCG tablet Take 25 mcg by mouth daily before breakfast.     nystatin cream (MYCOSTATIN) APPLY TO AFFECTED AREA TWICE A DAY 30 g 1   potassium chloride (KLOR-CON) 10 MEQ tablet TAKE 1 TABLET BY MOUTH TWICE A DAY 180 tablet 2   pravastatin (PRAVACHOL) 40 MG tablet TAKE 1 TABLET BY MOUTH EVERY DAY 90 tablet 1   sulfamethoxazole-trimethoprim (BACTRIM DS) 800-160 MG tablet Take 1 tablet by mouth 2 (two) times daily. 20 tablet 0   vitamin B-12 (CYANOCOBALAMIN) 1000 MCG tablet Take 1,000 mcg by mouth daily.     No current facility-administered medications for this visit.   Facility-Administered Medications Ordered in Other Visits  Medication Dose Route Frequency Provider Last Rate Last Admin   0.9 %  sodium chloride infusion   Intravenous Continuous Kyleigh Nannini, Vonna Kotyk R, NP       heparin lock flush 100 unit/mL  500 Units Intravenous Once [name], Kathlene November, MD       sodium chloride flush (NS) 0.9 % injection 10 mL  10 mL Intravenous PRN Lloyd Huger, MD   10 mL at 02/01/18 0829   sodium chloride flush (NS) 0.9 % injection 10 mL  10 mL Intravenous PRN Lloyd Huger, MD   10 mL at 04/05/18 0800    VITAL SIGNS: BP (!) 61/48   Pulse (!) 53   Temp 97.8 F (36.6 C) (Tympanic)   Resp (!) 22   Ht 5\' 10"  (1.778 m)   Wt 201 lb (91.2 kg)   SpO2 99%   BMI 28.84 kg/m  Filed Weights   01/07/23 1015  Weight: 201 lb (91.2 kg)    Estimated body mass index is 28.84 kg/m as calculated from the following:   Height as of this encounter: 5\' 10"  (1.778 m).   Weight as of this encounter: 201 lb (91.2 kg).  LABS: CBC:    Component Value Date/Time   WBC 3.1 (L) 01/07/2023 1020   WBC 4.6 12/03/2022 0816   HGB 11.5 (L) 01/07/2023 1020   HGB 13.2 01/16/2015 1429   HCT 34.0 (L) 01/07/2023 1020   HCT 38.9 (L) 01/16/2015 1429   PLT 77 (L) 01/07/2023 1020   PLT 156 01/16/2015 1429   MCV 90.7 01/07/2023 1020   MCV 83 01/16/2015 1429   NEUTROABS PENDING 01/07/2023 1020   NEUTROABS  4.7 01/16/2015 1429   LYMPHSABS PENDING 01/07/2023 1020   LYMPHSABS 1.4 01/16/2015 1429   MONOABS PENDING 01/07/2023 1020   MONOABS 0.4 01/16/2015 1429   EOSABS PENDING 01/07/2023 1020   EOSABS 0.2 01/16/2015 1429   BASOSABS PENDING 01/07/2023 1020   BASOSABS 0.0 01/16/2015 1429   Comprehensive Metabolic Panel:    Component Value Date/Time   NA 136 01/07/2023 1020   NA 138 07/15/2014 0515   K 2.8 (L) 01/07/2023 1020   K 3.5 01/16/2015 1429   CL 102 01/07/2023 1020   CL 110 (H) 07/15/2014 0515   CO2 22 01/07/2023 1020   CO2 24 07/15/2014 0515  BUN 29 (H) 01/07/2023 1020   BUN 17 07/15/2014 0515   CREATININE 1.88 (H) 01/07/2023 1020   CREATININE 1.38 (H) 10/17/2021 1624   GLUCOSE 65 (L) 01/07/2023 1020   GLUCOSE 145 (H) 07/15/2014 0515   CALCIUM 7.7 (L) 01/07/2023 1020   CALCIUM 7.1 (L) 07/15/2014 0515   AST 22 01/07/2023 1020   ALT 8 01/07/2023 1020   ALT 21 10/22/2014 1000   ALKPHOS 90 01/07/2023 1020   ALKPHOS 118 (H) 10/22/2014 1000   BILITOT 0.4 01/07/2023 1020   PROT 6.3 (L) 01/07/2023 1020   PROT 6.2 (L) 10/22/2014 1000   ALBUMIN 3.3 (L) 01/07/2023 1020   ALBUMIN 2.6 (L) 10/22/2014 1000    RADIOGRAPHIC STUDIES: NM Myocar Multi W/Spect W/Wall Motion / EF  Result Date: 12/30/2022   The study is normal. The study is low risk.   No ST deviation was noted.   Left ventricular function is normal. End diastolic cavity size is normal.   Normal left ventricular function at 68%   No evidence of stress-induced myocardial ischemia or defects Conclusion Normal myocardial perfusion scan No evidence of stress-induced myocardial ischemia Ejection fraction was normal at 60% No wall motion abnormalities This is a low risk study   NM PET Image Restag (PS) Skull Base To Thigh  Result Date: 12/11/2022 CLINICAL DATA:  Subsequent treatment strategy for mantle cell lymphoma. EXAM: NUCLEAR MEDICINE PET SKULL BASE TO THIGH TECHNIQUE: 12.6 mCi F-18 FDG was injected intravenously. Full-ring  PET imaging was performed from the skull base to thigh after the radiotracer. CT data was obtained and used for attenuation correction and anatomic localization. Fasting blood glucose: 164 mg/dl COMPARISON:  PET-CT 08/24/2022. CT of the chest, abdomen and pelvis 08/07/2022. FINDINGS: Mediastinal blood pool activity: SUV max 1.9 Liver activity: SUV max 2.4 This PET facility now utilizes time-of-flight PET imaging. This new technology can increase SUV measurements by 16- 20 % over conventional PET technology. NECK: No hypermetabolic cervical lymph nodes are identified. No suspicious activity identified within the pharyngeal mucosal space. Incidental CT findings: none CHEST: There are no hypermetabolic mediastinal, hilar or axillary lymph nodes. No hypermetabolic pulmonary activity or suspicious nodularity. Incidental CT findings: Right IJ Port-A-Cath extends to the superior cavoatrial junction. Left subclavian pacemaker leads appear unchanged. Mild coronary artery atherosclerosis. Mild centrilobular emphysema with scattered pulmonary scarring bilaterally, unchanged. ABDOMEN/PELVIS: There is no hypermetabolic activity within the liver, adrenal glands, spleen or pancreas. Significant interval improvement in the previously demonstrated hypermetabolic retroperitoneal and pelvic lymphadenopathy. The largest remaining retroperitoneal lymph node is a left common iliac node measuring 1.1 cm on image 111/3 and demonstrating an SUV max of 1.6 (previously 1.8 cm). Residual left external iliac node measures 1.9 cm on image 130/3 and has an SUV max of 2.3 (previously 3.2 cm, SUV max 2.2). Right inguinal node measures 1.3 cm on image 153/3 and has an SUV max of 1.6 (previously 2.1). No progressive adenopathy. Incidental CT findings: Solitary left kidney, previous cholecystectomy and aortic atherosclerosis. There is a broad-based periumbilical hernia. SKELETON: There is no hypermetabolic activity to suggest osseous metastatic  disease. Incidental CT findings: Exaggerated thoracic kyphosis and mild spondylosis. Previously demonstrated subcutaneous nodules in the left buttocks have resolved. IMPRESSION: 1. Significant interval response to therapy with decreased size and metabolic activity of previously demonstrated hypermetabolic retroperitoneal and pelvic lymph nodes. The remaining lymph nodes are [place] 2/3. No progressive disease identified. 2. No acute findings. 3. Aortic Atherosclerosis (ICD10-I70.0) and Emphysema (ICD10-J43.9). Electronically Signed   By: Gwyndolyn Saxon  Lin Landsman M.D.   On: 12/11/2022 15:50    PERFORMANCE STATUS (ECOG) : 2 - Symptomatic, <50% confined to bed  Review of Systems Unless otherwise noted, a complete review of systems is negative.  Physical Exam General: NAD Cardiovascular: regular rate and rhythm Pulmonary: clear ant fields Abdomen: soft, nontender, + bowel sounds GU: no suprapubic tenderness Extremities: no edema, no joint deformities Skin: no rashes Neurological: Weakness but otherwise nonfocal  IMPRESSION/PLAN: Diarrhea -unclear etiology.  Exam is benign. Patient has been off treatment with Donnie Aho and Rituxan since January.  Last PET scan showed significant interval disease improvement so unlikely that diarrhea would stem from lymphoma or cancer treatment at this point.  Will check stool studies to rule out infectious etiologies.   If negative, could consider referral to GI and obtaining repeat imaging.  Dehydration -patient profoundly orthostatic upon arrival to clinic.  Will proceed with gentle fluid rehydration given history of CHF.  Patient advised to reach out to the cardiology for possible dose adjustment of his diuretics.   Hypokalemia -likely due to GI losses from diarrhea.  Proceed with IV repletion.  Resume oral K-Dur.  Repeat labs tomorrow.  Weakness -referral to home health  Case and plan discussed with Dr. [name].  Will bring patient back tomorrow for  labs/fluids   Patient expressed understanding and was in agreement with this plan. He also understands that He can call clinic at any time with any questions, concerns, or complaints.   Thank you for allowing me to participate in the care of this very pleasant patient.   Time Total: 25 minutes  Visit consisted of counseling and education dealing with the complex and emotionally intense issues of symptom management in the setting of serious illness.Greater than 50%  of this time was spent counseling and coordinating care related to the above assessment and plan.  Signed by: Altha Harm, PhD, NP-C

## 2023-01-07 NOTE — Progress Notes (Signed)
1255-pt wanted to stand b/c his legs were cramping. Rn rechecked orthostatic vitals. bp sitting; 115/62; bp on standing 99/62 HR 74

## 2023-01-08 ENCOUNTER — Ambulatory Visit: Payer: 59

## 2023-01-08 ENCOUNTER — Inpatient Hospital Stay: Payer: Medicare Other

## 2023-01-08 ENCOUNTER — Ambulatory Visit: Payer: 59 | Admitting: Internal Medicine

## 2023-01-08 VITALS — BP 100/66 | HR 76 | Temp 96.7°F | Resp 18

## 2023-01-08 DIAGNOSIS — C8318 Mantle cell lymphoma, lymph nodes of multiple sites: Secondary | ICD-10-CM

## 2023-01-08 DIAGNOSIS — R531 Weakness: Secondary | ICD-10-CM | POA: Diagnosis not present

## 2023-01-08 DIAGNOSIS — Z95828 Presence of other vascular implants and grafts: Secondary | ICD-10-CM

## 2023-01-08 DIAGNOSIS — E876 Hypokalemia: Secondary | ICD-10-CM

## 2023-01-08 DIAGNOSIS — I951 Orthostatic hypotension: Secondary | ICD-10-CM

## 2023-01-08 DIAGNOSIS — I130 Hypertensive heart and chronic kidney disease with heart failure and stage 1 through stage 4 chronic kidney disease, or unspecified chronic kidney disease: Secondary | ICD-10-CM | POA: Diagnosis not present

## 2023-01-08 DIAGNOSIS — E860 Dehydration: Secondary | ICD-10-CM | POA: Diagnosis not present

## 2023-01-08 DIAGNOSIS — K529 Noninfective gastroenteritis and colitis, unspecified: Secondary | ICD-10-CM

## 2023-01-08 DIAGNOSIS — E1165 Type 2 diabetes mellitus with hyperglycemia: Secondary | ICD-10-CM | POA: Diagnosis not present

## 2023-01-08 DIAGNOSIS — C8315 Mantle cell lymphoma, lymph nodes of inguinal region and lower limb: Secondary | ICD-10-CM | POA: Diagnosis not present

## 2023-01-08 DIAGNOSIS — R110 Nausea: Secondary | ICD-10-CM

## 2023-01-08 LAB — C DIFFICILE QUICK SCREEN W PCR REFLEX
C Diff antigen: NEGATIVE
C Diff interpretation: NOT DETECTED
C Diff toxin: NEGATIVE

## 2023-01-08 LAB — BASIC METABOLIC PANEL - CANCER CENTER ONLY
Anion gap: 15 (ref 5–15)
BUN: 27 mg/dL — ABNORMAL HIGH (ref 8–23)
CO2: 22 mmol/L (ref 22–32)
Calcium: 8.3 mg/dL — ABNORMAL LOW (ref 8.9–10.3)
Chloride: 99 mmol/L (ref 98–111)
Creatinine: 1.84 mg/dL — ABNORMAL HIGH (ref 0.61–1.24)
GFR, Estimated: 38 mL/min — ABNORMAL LOW (ref >60)
Glucose, Bld: 76 mg/dL (ref 70–99)
Potassium: 3 mmol/L — ABNORMAL LOW (ref 3.5–5.1)
Sodium: 136 mmol/L (ref 135–145)

## 2023-01-08 MED ORDER — ONDANSETRON HCL 4 MG/2ML IJ SOLN
8.0000 mg | Freq: Once | INTRAMUSCULAR | Status: AC
  Administered 2023-01-08: 8 mg via INTRAVENOUS
  Filled 2023-01-08: qty 4

## 2023-01-08 MED ORDER — SODIUM CHLORIDE 0.9 % IV SOLN
INTRAVENOUS | Status: DC
  Filled 2023-01-08 (×2): qty 250

## 2023-01-08 MED ORDER — POTASSIUM CHLORIDE CRYS ER 20 MEQ PO TBCR: 20.0000 meq | EXTENDED_RELEASE_TABLET | Freq: Two times a day (BID) | ORAL | 0 refills | Status: DC

## 2023-01-08 MED ORDER — HEPARIN SOD (PORK) LOCK FLUSH 100 UNIT/ML IV SOLN
500.0000 [IU] | Freq: Once | INTRAVENOUS | Status: AC
  Administered 2023-01-08: 500 [IU]
  Filled 2023-01-08: qty 5

## 2023-01-08 MED ORDER — POTASSIUM CHLORIDE 20 MEQ/100ML IV SOLN
20.0000 meq | Freq: Once | INTRAVENOUS | Status: AC
  Administered 2023-01-08: 20 meq via INTRAVENOUS

## 2023-01-08 MED ORDER — SODIUM CHLORIDE 0.9% FLUSH
10.0000 mL | Freq: Once | INTRAVENOUS | Status: AC
  Administered 2023-01-08: 10 mL via INTRAVENOUS
  Filled 2023-01-08: qty 10

## 2023-01-08 NOTE — Patient Instructions (Addendum)
Please pick up prescription for potassium Please take potassium 20 meq tablets daily with glass of water and food.  Return to clinic on Monday as planned for port labs and possible iv fluids/potassium

## 2023-01-11 ENCOUNTER — Inpatient Hospital Stay: Payer: Medicare Other

## 2023-01-11 ENCOUNTER — Inpatient Hospital Stay: Payer: Medicare Other | Attending: Oncology

## 2023-01-11 ENCOUNTER — Other Ambulatory Visit: Payer: Self-pay | Admitting: Hospice and Palliative Medicine

## 2023-01-11 VITALS — BP 91/54 | HR 100 | Temp 97.9°F | Resp 18

## 2023-01-11 DIAGNOSIS — D696 Thrombocytopenia, unspecified: Secondary | ICD-10-CM | POA: Diagnosis not present

## 2023-01-11 DIAGNOSIS — C8315 Mantle cell lymphoma, lymph nodes of inguinal region and lower limb: Secondary | ICD-10-CM | POA: Insufficient documentation

## 2023-01-11 DIAGNOSIS — E876 Hypokalemia: Secondary | ICD-10-CM

## 2023-01-11 DIAGNOSIS — C8318 Mantle cell lymphoma, lymph nodes of multiple sites: Secondary | ICD-10-CM

## 2023-01-11 DIAGNOSIS — D649 Anemia, unspecified: Secondary | ICD-10-CM | POA: Diagnosis not present

## 2023-01-11 DIAGNOSIS — E1165 Type 2 diabetes mellitus with hyperglycemia: Secondary | ICD-10-CM | POA: Insufficient documentation

## 2023-01-11 DIAGNOSIS — D72819 Decreased white blood cell count, unspecified: Secondary | ICD-10-CM | POA: Insufficient documentation

## 2023-01-11 DIAGNOSIS — N289 Disorder of kidney and ureter, unspecified: Secondary | ICD-10-CM | POA: Insufficient documentation

## 2023-01-11 DIAGNOSIS — Z803 Family history of malignant neoplasm of breast: Secondary | ICD-10-CM | POA: Insufficient documentation

## 2023-01-11 DIAGNOSIS — Z794 Long term (current) use of insulin: Secondary | ICD-10-CM | POA: Insufficient documentation

## 2023-01-11 DIAGNOSIS — Z87891 Personal history of nicotine dependence: Secondary | ICD-10-CM | POA: Insufficient documentation

## 2023-01-11 DIAGNOSIS — Z807 Family history of other malignant neoplasms of lymphoid, hematopoietic and related tissues: Secondary | ICD-10-CM | POA: Insufficient documentation

## 2023-01-11 DIAGNOSIS — Z950 Presence of cardiac pacemaker: Secondary | ICD-10-CM | POA: Diagnosis not present

## 2023-01-11 DIAGNOSIS — R197 Diarrhea, unspecified: Secondary | ICD-10-CM | POA: Diagnosis not present

## 2023-01-11 DIAGNOSIS — I951 Orthostatic hypotension: Secondary | ICD-10-CM

## 2023-01-11 DIAGNOSIS — R001 Bradycardia, unspecified: Secondary | ICD-10-CM | POA: Diagnosis not present

## 2023-01-11 DIAGNOSIS — Z95828 Presence of other vascular implants and grafts: Secondary | ICD-10-CM

## 2023-01-11 LAB — BASIC METABOLIC PANEL - CANCER CENTER ONLY
Anion gap: 12 (ref 5–15)
BUN: 28 mg/dL — ABNORMAL HIGH (ref 8–23)
CO2: 20 mmol/L — ABNORMAL LOW (ref 22–32)
Calcium: 7.7 mg/dL — ABNORMAL LOW (ref 8.9–10.3)
Chloride: 100 mmol/L (ref 98–111)
Creatinine: 2.04 mg/dL — ABNORMAL HIGH (ref 0.61–1.24)
GFR, Estimated: 34 mL/min — ABNORMAL LOW (ref >60)
Glucose, Bld: 76 mg/dL (ref 70–99)
Potassium: 3.3 mmol/L — ABNORMAL LOW (ref 3.5–5.1)
Sodium: 132 mmol/L — ABNORMAL LOW (ref 135–145)

## 2023-01-11 LAB — GI PATHOGEN PANEL BY PCR, STOOL

## 2023-01-11 MED ORDER — SODIUM CHLORIDE 0.9 % IV SOLN
INTRAVENOUS | Status: DC
  Filled 2023-01-11 (×2): qty 250

## 2023-01-11 MED ORDER — HEPARIN SOD (PORK) LOCK FLUSH 100 UNIT/ML IV SOLN
500.0000 [IU] | Freq: Once | INTRAVENOUS | Status: AC
  Administered 2023-01-11: 500 [IU]
  Filled 2023-01-11: qty 5

## 2023-01-11 MED ORDER — SODIUM CHLORIDE 0.9% FLUSH
10.0000 mL | Freq: Once | INTRAVENOUS | Status: AC
  Administered 2023-01-11: 10 mL via INTRAVENOUS
  Filled 2023-01-11: qty 10

## 2023-01-11 MED ORDER — POTASSIUM CHLORIDE 20 MEQ/100ML IV SOLN
20.0000 meq | Freq: Once | INTRAVENOUS | Status: AC
  Administered 2023-01-11: 20 meq via INTRAVENOUS

## 2023-01-11 NOTE — Progress Notes (Signed)
Rn called pt's sister and provided d/c instructions to susan per pt's request. 1600  Pt will be contacted with apts for ct scan/ md/ port lab and possible fluid. Pt aware of plan of care. Staff is aware that pt may need Lucianne Lei transportation if his sister Manuela Schwartz is unable to bring him to the apt.

## 2023-01-11 NOTE — Patient Instructions (Addendum)
Call your cardiologist to get a sooner apt to manage your low blood pressure [phone] Call the GI dept Dr. Vicente Males to follow-up on the referral.   [phone] 3. We are scheduling a f/u with Dr. [name] and rechecking your labs and fluids and a ct scan chest abd and pelvis sooner than previously scheduled. We are waiting on insurance authorization to get your scan approved.

## 2023-01-11 NOTE — Progress Notes (Signed)
Discussed with Dr. [name] who recommend CT C/A/P without contrast.

## 2023-01-12 ENCOUNTER — Other Ambulatory Visit: Payer: Self-pay

## 2023-01-12 ENCOUNTER — Telehealth: Payer: Self-pay

## 2023-01-12 ENCOUNTER — Ambulatory Visit (INDEPENDENT_AMBULATORY_CARE_PROVIDER_SITE_OTHER): Payer: Medicare Other | Admitting: Internal Medicine

## 2023-01-12 ENCOUNTER — Telehealth: Payer: Self-pay | Admitting: Gastroenterology

## 2023-01-12 ENCOUNTER — Emergency Department: Payer: Medicare Other

## 2023-01-12 ENCOUNTER — Encounter: Payer: Self-pay | Admitting: Internal Medicine

## 2023-01-12 ENCOUNTER — Inpatient Hospital Stay
Admission: EM | Admit: 2023-01-12 | Discharge: 2023-01-25 | DRG: 871 | Disposition: A | Discharge status: Skilled Nursing Facility | Payer: Medicare Other | Source: Ambulatory Visit | Attending: Internal Medicine | Admitting: Internal Medicine

## 2023-01-12 VITALS — BP 88/60 | HR 108 | Temp 98.0°F | Resp 16 | Ht 70.0 in | Wt 205.0 lb

## 2023-01-12 DIAGNOSIS — E119 Type 2 diabetes mellitus without complications: Secondary | ICD-10-CM

## 2023-01-12 DIAGNOSIS — L89322 Pressure ulcer of left buttock, stage 2: Secondary | ICD-10-CM | POA: Diagnosis present

## 2023-01-12 DIAGNOSIS — E538 Deficiency of other specified B group vitamins: Secondary | ICD-10-CM | POA: Diagnosis not present

## 2023-01-12 DIAGNOSIS — E559 Vitamin D deficiency, unspecified: Secondary | ICD-10-CM | POA: Diagnosis not present

## 2023-01-12 DIAGNOSIS — L89156 Pressure-induced deep tissue damage of sacral region: Secondary | ICD-10-CM | POA: Diagnosis present

## 2023-01-12 DIAGNOSIS — Z833 Family history of diabetes mellitus: Secondary | ICD-10-CM

## 2023-01-12 DIAGNOSIS — E1143 Type 2 diabetes mellitus with diabetic autonomic (poly)neuropathy: Secondary | ICD-10-CM | POA: Diagnosis present

## 2023-01-12 DIAGNOSIS — Z87891 Personal history of nicotine dependence: Secondary | ICD-10-CM

## 2023-01-12 DIAGNOSIS — K224 Dyskinesia of esophagus: Secondary | ICD-10-CM | POA: Diagnosis present

## 2023-01-12 DIAGNOSIS — E11649 Type 2 diabetes mellitus with hypoglycemia without coma: Secondary | ICD-10-CM | POA: Diagnosis not present

## 2023-01-12 DIAGNOSIS — K222 Esophageal obstruction: Secondary | ICD-10-CM | POA: Diagnosis not present

## 2023-01-12 DIAGNOSIS — F4321 Adjustment disorder with depressed mood: Secondary | ICD-10-CM | POA: Diagnosis present

## 2023-01-12 DIAGNOSIS — I951 Orthostatic hypotension: Secondary | ICD-10-CM | POA: Diagnosis not present

## 2023-01-12 DIAGNOSIS — Z9221 Personal history of antineoplastic chemotherapy: Secondary | ICD-10-CM

## 2023-01-12 DIAGNOSIS — R933 Abnormal findings on diagnostic imaging of other parts of digestive tract: Secondary | ICD-10-CM | POA: Diagnosis not present

## 2023-01-12 DIAGNOSIS — E860 Dehydration: Principal | ICD-10-CM

## 2023-01-12 DIAGNOSIS — I700 Atherosclerosis of aorta: Secondary | ICD-10-CM | POA: Diagnosis not present

## 2023-01-12 DIAGNOSIS — K449 Diaphragmatic hernia without obstruction or gangrene: Secondary | ICD-10-CM | POA: Diagnosis present

## 2023-01-12 DIAGNOSIS — Z515 Encounter for palliative care: Secondary | ICD-10-CM | POA: Diagnosis not present

## 2023-01-12 DIAGNOSIS — I10 Essential (primary) hypertension: Secondary | ICD-10-CM

## 2023-01-12 DIAGNOSIS — N179 Acute kidney failure, unspecified: Secondary | ICD-10-CM | POA: Diagnosis present

## 2023-01-12 DIAGNOSIS — K521 Toxic gastroenteritis and colitis: Secondary | ICD-10-CM | POA: Diagnosis present

## 2023-01-12 DIAGNOSIS — K2970 Gastritis, unspecified, without bleeding: Secondary | ICD-10-CM | POA: Diagnosis not present

## 2023-01-12 DIAGNOSIS — Z803 Family history of malignant neoplasm of breast: Secondary | ICD-10-CM

## 2023-01-12 DIAGNOSIS — Z807 Family history of other malignant neoplasms of lymphoid, hematopoietic and related tissues: Secondary | ICD-10-CM

## 2023-01-12 DIAGNOSIS — Z905 Acquired absence of kidney: Secondary | ICD-10-CM

## 2023-01-12 DIAGNOSIS — A419 Sepsis, unspecified organism: Secondary | ICD-10-CM | POA: Diagnosis present

## 2023-01-12 DIAGNOSIS — J189 Pneumonia, unspecified organism: Secondary | ICD-10-CM | POA: Diagnosis present

## 2023-01-12 DIAGNOSIS — L0390 Cellulitis, unspecified: Secondary | ICD-10-CM | POA: Diagnosis not present

## 2023-01-12 DIAGNOSIS — D649 Anemia, unspecified: Secondary | ICD-10-CM

## 2023-01-12 DIAGNOSIS — E1122 Type 2 diabetes mellitus with diabetic chronic kidney disease: Secondary | ICD-10-CM | POA: Diagnosis present

## 2023-01-12 DIAGNOSIS — J439 Emphysema, unspecified: Secondary | ICD-10-CM | POA: Diagnosis not present

## 2023-01-12 DIAGNOSIS — W19XXXA Unspecified fall, initial encounter: Secondary | ICD-10-CM | POA: Diagnosis not present

## 2023-01-12 DIAGNOSIS — E10311 Type 1 diabetes mellitus with unspecified diabetic retinopathy with macular edema: Secondary | ICD-10-CM | POA: Diagnosis not present

## 2023-01-12 DIAGNOSIS — J9811 Atelectasis: Secondary | ICD-10-CM | POA: Diagnosis not present

## 2023-01-12 DIAGNOSIS — R791 Abnormal coagulation profile: Secondary | ICD-10-CM | POA: Diagnosis present

## 2023-01-12 DIAGNOSIS — Z9181 History of falling: Secondary | ICD-10-CM | POA: Diagnosis not present

## 2023-01-12 DIAGNOSIS — E1110 Type 2 diabetes mellitus with ketoacidosis without coma: Secondary | ICD-10-CM | POA: Diagnosis present

## 2023-01-12 DIAGNOSIS — K2289 Other specified disease of esophagus: Secondary | ICD-10-CM | POA: Diagnosis present

## 2023-01-12 DIAGNOSIS — R531 Weakness: Secondary | ICD-10-CM | POA: Diagnosis not present

## 2023-01-12 DIAGNOSIS — K219 Gastro-esophageal reflux disease without esophagitis: Secondary | ICD-10-CM | POA: Diagnosis present

## 2023-01-12 DIAGNOSIS — E8729 Other acidosis: Secondary | ICD-10-CM | POA: Diagnosis not present

## 2023-01-12 DIAGNOSIS — I130 Hypertensive heart and chronic kidney disease with heart failure and stage 1 through stage 4 chronic kidney disease, or unspecified chronic kidney disease: Secondary | ICD-10-CM | POA: Diagnosis present

## 2023-01-12 DIAGNOSIS — R6510 Systemic inflammatory response syndrome (SIRS) of non-infectious origin without acute organ dysfunction: Secondary | ICD-10-CM | POA: Diagnosis not present

## 2023-01-12 DIAGNOSIS — E039 Hypothyroidism, unspecified: Secondary | ICD-10-CM | POA: Diagnosis not present

## 2023-01-12 DIAGNOSIS — I429 Cardiomyopathy, unspecified: Secondary | ICD-10-CM

## 2023-01-12 DIAGNOSIS — E1040 Type 1 diabetes mellitus with diabetic neuropathy, unspecified: Secondary | ICD-10-CM | POA: Diagnosis not present

## 2023-01-12 DIAGNOSIS — E1151 Type 2 diabetes mellitus with diabetic peripheral angiopathy without gangrene: Secondary | ICD-10-CM | POA: Diagnosis not present

## 2023-01-12 DIAGNOSIS — Z7189 Other specified counseling: Secondary | ICD-10-CM | POA: Diagnosis not present

## 2023-01-12 DIAGNOSIS — C8318 Mantle cell lymphoma, lymph nodes of multiple sites: Secondary | ICD-10-CM | POA: Diagnosis not present

## 2023-01-12 DIAGNOSIS — E0500 Thyrotoxicosis with diffuse goiter without thyrotoxic crisis or storm: Secondary | ICD-10-CM | POA: Diagnosis not present

## 2023-01-12 DIAGNOSIS — L8990 Pressure ulcer of unspecified site, unspecified stage: Secondary | ICD-10-CM | POA: Diagnosis present

## 2023-01-12 DIAGNOSIS — Z794 Long term (current) use of insulin: Secondary | ICD-10-CM | POA: Diagnosis not present

## 2023-01-12 DIAGNOSIS — Z8249 Family history of ischemic heart disease and other diseases of the circulatory system: Secondary | ICD-10-CM

## 2023-01-12 DIAGNOSIS — R6520 Severe sepsis without septic shock: Secondary | ICD-10-CM | POA: Diagnosis present

## 2023-01-12 DIAGNOSIS — L03115 Cellulitis of right lower limb: Secondary | ICD-10-CM | POA: Diagnosis present

## 2023-01-12 DIAGNOSIS — F320 Major depressive disorder, single episode, mild: Secondary | ICD-10-CM | POA: Diagnosis not present

## 2023-01-12 DIAGNOSIS — R1310 Dysphagia, unspecified: Secondary | ICD-10-CM | POA: Diagnosis not present

## 2023-01-12 DIAGNOSIS — K3184 Gastroparesis: Secondary | ICD-10-CM | POA: Diagnosis present

## 2023-01-12 DIAGNOSIS — D631 Anemia in chronic kidney disease: Secondary | ICD-10-CM | POA: Diagnosis not present

## 2023-01-12 DIAGNOSIS — E7800 Pure hypercholesterolemia, unspecified: Secondary | ICD-10-CM | POA: Diagnosis present

## 2023-01-12 DIAGNOSIS — D61810 Antineoplastic chemotherapy induced pancytopenia: Secondary | ICD-10-CM | POA: Diagnosis present

## 2023-01-12 DIAGNOSIS — C8310 Mantle cell lymphoma, unspecified site: Secondary | ICD-10-CM | POA: Diagnosis present

## 2023-01-12 DIAGNOSIS — I5022 Chronic systolic (congestive) heart failure: Secondary | ICD-10-CM

## 2023-01-12 DIAGNOSIS — N1832 Chronic kidney disease, stage 3b: Secondary | ICD-10-CM

## 2023-01-12 DIAGNOSIS — I959 Hypotension, unspecified: Secondary | ICD-10-CM | POA: Diagnosis not present

## 2023-01-12 DIAGNOSIS — I2510 Atherosclerotic heart disease of native coronary artery without angina pectoris: Secondary | ICD-10-CM | POA: Diagnosis present

## 2023-01-12 DIAGNOSIS — R634 Abnormal weight loss: Secondary | ICD-10-CM | POA: Diagnosis not present

## 2023-01-12 DIAGNOSIS — T451X5A Adverse effect of antineoplastic and immunosuppressive drugs, initial encounter: Secondary | ICD-10-CM | POA: Diagnosis present

## 2023-01-12 DIAGNOSIS — R197 Diarrhea, unspecified: Secondary | ICD-10-CM | POA: Diagnosis present

## 2023-01-12 DIAGNOSIS — Z751 Person awaiting admission to adequate facility elsewhere: Secondary | ICD-10-CM

## 2023-01-12 DIAGNOSIS — E11311 Type 2 diabetes mellitus with unspecified diabetic retinopathy with macular edema: Secondary | ICD-10-CM | POA: Diagnosis present

## 2023-01-12 DIAGNOSIS — T1490XA Injury, unspecified, initial encounter: Secondary | ICD-10-CM | POA: Diagnosis not present

## 2023-01-12 DIAGNOSIS — Z79890 Hormone replacement therapy (postmenopausal): Secondary | ICD-10-CM

## 2023-01-12 DIAGNOSIS — R609 Edema, unspecified: Secondary | ICD-10-CM | POA: Diagnosis not present

## 2023-01-12 DIAGNOSIS — R54 Age-related physical debility: Secondary | ICD-10-CM | POA: Diagnosis present

## 2023-01-12 DIAGNOSIS — N1831 Chronic kidney disease, stage 3a: Secondary | ICD-10-CM | POA: Diagnosis not present

## 2023-01-12 DIAGNOSIS — K4090 Unilateral inguinal hernia, without obstruction or gangrene, not specified as recurrent: Secondary | ICD-10-CM | POA: Diagnosis not present

## 2023-01-12 DIAGNOSIS — E1029 Type 1 diabetes mellitus with other diabetic kidney complication: Secondary | ICD-10-CM

## 2023-01-12 DIAGNOSIS — F419 Anxiety disorder, unspecified: Secondary | ICD-10-CM | POA: Diagnosis not present

## 2023-01-12 DIAGNOSIS — R1110 Vomiting, unspecified: Secondary | ICD-10-CM | POA: Diagnosis not present

## 2023-01-12 DIAGNOSIS — N183 Chronic kidney disease, stage 3 unspecified: Secondary | ICD-10-CM | POA: Diagnosis not present

## 2023-01-12 DIAGNOSIS — L89312 Pressure ulcer of right buttock, stage 2: Secondary | ICD-10-CM | POA: Diagnosis not present

## 2023-01-12 DIAGNOSIS — N281 Cyst of kidney, acquired: Secondary | ICD-10-CM | POA: Diagnosis not present

## 2023-01-12 DIAGNOSIS — E1022 Type 1 diabetes mellitus with diabetic chronic kidney disease: Secondary | ICD-10-CM | POA: Diagnosis not present

## 2023-01-12 DIAGNOSIS — I509 Heart failure, unspecified: Secondary | ICD-10-CM | POA: Diagnosis present

## 2023-01-12 DIAGNOSIS — R000 Tachycardia, unspecified: Secondary | ICD-10-CM | POA: Diagnosis not present

## 2023-01-12 DIAGNOSIS — Z950 Presence of cardiac pacemaker: Secondary | ICD-10-CM | POA: Diagnosis not present

## 2023-01-12 DIAGNOSIS — Z7401 Bed confinement status: Secondary | ICD-10-CM | POA: Diagnosis not present

## 2023-01-12 DIAGNOSIS — Z9049 Acquired absence of other specified parts of digestive tract: Secondary | ICD-10-CM

## 2023-01-12 DIAGNOSIS — R1311 Dysphagia, oral phase: Secondary | ICD-10-CM | POA: Diagnosis not present

## 2023-01-12 DIAGNOSIS — Z79899 Other long term (current) drug therapy: Secondary | ICD-10-CM

## 2023-01-12 DIAGNOSIS — L89152 Pressure ulcer of sacral region, stage 2: Secondary | ICD-10-CM | POA: Diagnosis not present

## 2023-01-12 DIAGNOSIS — L03116 Cellulitis of left lower limb: Secondary | ICD-10-CM | POA: Diagnosis not present

## 2023-01-12 DIAGNOSIS — R809 Proteinuria, unspecified: Secondary | ICD-10-CM

## 2023-01-12 DIAGNOSIS — R109 Unspecified abdominal pain: Secondary | ICD-10-CM | POA: Diagnosis not present

## 2023-01-12 LAB — BASIC METABOLIC PANEL
Anion gap: 14 (ref 5–15)
BUN: 37 mg/dL — ABNORMAL HIGH (ref 8–23)
CO2: 19 mmol/L — ABNORMAL LOW (ref 22–32)
Calcium: 8.1 mg/dL — ABNORMAL LOW (ref 8.9–10.3)
Chloride: 101 mmol/L (ref 98–111)
Creatinine, Ser: 2.46 mg/dL — ABNORMAL HIGH (ref 0.61–1.24)
GFR, Estimated: 27 mL/min — ABNORMAL LOW (ref >60)
Glucose, Bld: 70 mg/dL (ref 70–99)
Potassium: 3.5 mmol/L (ref 3.5–5.1)
Sodium: 134 mmol/L — ABNORMAL LOW (ref 135–145)

## 2023-01-12 LAB — HEPATIC FUNCTION PANEL
ALT: 8 U/L (ref 0–44)
AST: 20 U/L (ref 15–41)
Albumin: 2.9 g/dL — ABNORMAL LOW (ref 3.5–5.0)
Alkaline Phosphatase: 82 U/L (ref 38–126)
Bilirubin, Direct: 0.1 mg/dL (ref 0.0–0.2)
Indirect Bilirubin: 0.8 mg/dL (ref 0.3–0.9)
Total Bilirubin: 0.9 mg/dL (ref 0.3–1.2)
Total Protein: 5.8 g/dL — ABNORMAL LOW (ref 6.5–8.1)

## 2023-01-12 LAB — MAGNESIUM: Magnesium: 1.7 mg/dL (ref 1.7–2.4)

## 2023-01-12 LAB — VITAMIN B12: Vitamin B-12: 1988 pg/mL — ABNORMAL HIGH (ref 180–914)

## 2023-01-12 LAB — CBG MONITORING, ED: Glucose-Capillary: 149 mg/dL — ABNORMAL HIGH (ref 70–99)

## 2023-01-12 LAB — CBC
HCT: 34.6 % — ABNORMAL LOW (ref 39.0–52.0)
Hemoglobin: 11.3 g/dL — ABNORMAL LOW (ref 13.0–17.0)
MCH: 30.1 pg (ref 26.0–34.0)
MCHC: 32.7 g/dL (ref 30.0–36.0)
MCV: 92 fL (ref 80.0–100.0)
Platelets: 92 10*3/uL — ABNORMAL LOW (ref 150–400)
RBC: 3.76 MIL/uL — ABNORMAL LOW (ref 4.22–5.81)
RDW: 14.5 % (ref 11.5–15.5)
WBC: 3.3 10*3/uL — ABNORMAL LOW (ref 4.0–10.5)
nRBC: 0.6 % — ABNORMAL HIGH (ref 0.0–0.2)

## 2023-01-12 LAB — PHOSPHORUS: Phosphorus: 3.2 mg/dL (ref 2.5–4.6)

## 2023-01-12 LAB — TROPONIN I (HIGH SENSITIVITY)
Troponin I (High Sensitivity): 27 ng/L — ABNORMAL HIGH (ref <18)
Troponin I (High Sensitivity): 28 ng/L — ABNORMAL HIGH (ref <18)

## 2023-01-12 LAB — PROCALCITONIN: Procalcitonin: 2.03 ng/mL

## 2023-01-12 LAB — LACTIC ACID, PLASMA
Lactic Acid, Venous: 2.1 mmol/L (ref 0.5–1.9)
Lactic Acid, Venous: 1.5 mmol/L (ref 0.5–1.9)

## 2023-01-12 LAB — SEDIMENTATION RATE: Sed Rate: 38 mm/hr — ABNORMAL HIGH (ref 0–20)

## 2023-01-12 MED ORDER — DEXTROSE 50 % IV SOLN: 50.0000 mL | INTRAVENOUS | Status: AC | PRN

## 2023-01-12 MED ORDER — SODIUM CHLORIDE 0.9 % IV SOLN
2.0000 g | Freq: Once | INTRAVENOUS | Status: AC
  Administered 2023-01-12: 2 g via INTRAVENOUS
  Filled 2023-01-12: qty 20

## 2023-01-12 MED ORDER — ACETAMINOPHEN 650 MG RE SUPP: 650.0000 mg | Freq: Four times a day (QID) | RECTAL | Status: AC | PRN

## 2023-01-12 MED ORDER — ACETAMINOPHEN 325 MG PO TABS: 650.0000 mg | ORAL_TABLET | Freq: Four times a day (QID) | ORAL | Status: AC | PRN

## 2023-01-12 MED ORDER — LEVOTHYROXINE SODIUM 50 MCG PO TABS
25.0000 ug | ORAL_TABLET | Freq: Every day | ORAL | Status: DC
  Administered 2023-01-13 – 2023-01-15 (×3): 25 ug via ORAL
  Filled 2023-01-12 (×3): qty 1

## 2023-01-12 MED ORDER — ONDANSETRON HCL 4 MG/2ML IJ SOLN: 4.0000 mg | Freq: Four times a day (QID) | INTRAMUSCULAR | Status: DC | PRN

## 2023-01-12 MED ORDER — LACTATED RINGERS IV BOLUS
1000.0000 mL | Freq: Once | INTRAVENOUS | Status: AC
  Administered 2023-01-12: 1000 mL via INTRAVENOUS

## 2023-01-12 MED ORDER — LACTATED RINGERS IV SOLN
150.0000 mL/h | INTRAVENOUS | Status: AC
  Administered 2023-01-13: 150 mL/h via INTRAVENOUS

## 2023-01-12 MED ORDER — ONDANSETRON HCL 4 MG/2ML IJ SOLN
4.0000 mg | Freq: Once | INTRAMUSCULAR | Status: AC
  Administered 2023-01-12: 4 mg via INTRAVENOUS
  Filled 2023-01-12: qty 2

## 2023-01-12 MED ORDER — VANCOMYCIN HCL 1750 MG/350ML IV SOLN
1750.0000 mg | Freq: Once | INTRAVENOUS | Status: AC
  Administered 2023-01-12: 1750 mg via INTRAVENOUS
  Filled 2023-01-12: qty 350

## 2023-01-12 MED ORDER — FLUOXETINE HCL 20 MG PO CAPS
20.0000 mg | ORAL_CAPSULE | Freq: Every day | ORAL | Status: DC
  Administered 2023-01-12 – 2023-01-14 (×3): 20 mg via ORAL
  Filled 2023-01-12 (×3): qty 1

## 2023-01-12 MED ORDER — ONDANSETRON HCL 4 MG PO TABS: 4.0000 mg | ORAL_TABLET | Freq: Four times a day (QID) | ORAL | Status: DC | PRN

## 2023-01-12 MED ORDER — PRAVASTATIN SODIUM 20 MG PO TABS
40.0000 mg | ORAL_TABLET | Freq: Every day | ORAL | Status: DC
  Administered 2023-01-13 – 2023-01-24 (×12): 40 mg via ORAL
  Filled 2023-01-12 (×2): qty 2
  Filled 2023-01-12 (×4): qty 1
  Filled 2023-01-12: qty 2
  Filled 2023-01-12: qty 1
  Filled 2023-01-12 (×3): qty 2
  Filled 2023-01-12: qty 1

## 2023-01-12 MED ORDER — HYDRALAZINE HCL 20 MG/ML IJ SOLN: 5.0000 mg | Freq: Three times a day (TID) | INTRAMUSCULAR | Status: AC | PRN

## 2023-01-12 MED ORDER — LACTATED RINGERS IV BOLUS (SEPSIS)
1000.0000 mL | Freq: Once | INTRAVENOUS | Status: AC
  Administered 2023-01-12: 1000 mL via INTRAVENOUS

## 2023-01-12 MED ORDER — SODIUM CHLORIDE 0.9 % IV SOLN
500.0000 mg | INTRAVENOUS | Status: AC
  Administered 2023-01-12 – 2023-01-16 (×5): 500 mg via INTRAVENOUS
  Filled 2023-01-12 (×4): qty 5
  Filled 2023-01-12 (×2): qty 500

## 2023-01-12 MED ORDER — FERROUS SULFATE 325 (65 FE) MG PO TABS: 325.0000 mg | ORAL_TABLET | Freq: Every day | ORAL | Status: DC

## 2023-01-12 MED ORDER — SODIUM CHLORIDE 0.9 % IV SOLN
2.0000 g | INTRAVENOUS | Status: DC
  Administered 2023-01-13 – 2023-01-17 (×5): 2 g via INTRAVENOUS
  Filled 2023-01-12: qty 20
  Filled 2023-01-12: qty 2
  Filled 2023-01-12 (×4): qty 20

## 2023-01-12 MED ORDER — FERROUS SULFATE 325 (65 FE) MG PO TABS
325.0000 mg | ORAL_TABLET | Freq: Every day | ORAL | Status: DC
  Administered 2023-01-13 – 2023-01-25 (×11): 325 mg via ORAL
  Filled 2023-01-12 (×11): qty 1

## 2023-01-12 MED ORDER — HEPARIN SODIUM (PORCINE) 5000 UNIT/ML IJ SOLN
5000.0000 [IU] | Freq: Three times a day (TID) | INTRAMUSCULAR | Status: DC
  Administered 2023-01-12 – 2023-01-13 (×2): 5000 [IU] via SUBCUTANEOUS
  Filled 2023-01-12 (×2): qty 1

## 2023-01-12 NOTE — Assessment & Plan Note (Signed)
Home levothyroxine 25 mcg daily before breakfast resumed

## 2023-01-12 NOTE — ED Provider Notes (Signed)
[name] Provider Note    Event Date/Time   First MD Initiated Contact with Patient 01/12/23 [phone]     (approximate)   History   Hypotension and Fall   HPI  Gary [name] is a 77 y.o. male with history of mantle cell lymphoma here with generalized weakness.  The patient states that for the last several weeks, to progress worsening generalized weakness.  States had decreased appetite, nausea, vomiting, diarrhea.  This is despite stopping chemo about 2 to 3 months ago.  He states that he has had progressively declining weakness.  He is fallen multiple times due to this.  States he feels like he occasionally has an appetite but then tries to eat and feels very nauseous and begins vomiting.  He had difficulty getting around.  He lives alone and has to take care of himself.  He is also had increasing lower extremity edema, for which she sees wound care.  Has noticed some increased redness in the right leg.     Physical Exam   Triage Vital Signs: ED Triage Vitals  Enc Vitals Group     BP 01/12/23 1453 (!) 81/59     Pulse Rate 01/12/23 1453 (!) 119     Resp 01/12/23 1453 18     Temp 01/12/23 1453 97.6 F (36.4 C)     Temp Source 01/12/23 1453 Oral     SpO2 01/12/23 1453 98 %     Weight 01/12/23 1455 205 lb 0.4 oz (93 kg)     Height 01/12/23 1455 5\' 10"  (1.778 m)     Head Circumference --      Peak Flow --      Pain Score 01/12/23 1455 2     Pain Loc --      Pain Edu? --      Excl. in [place]? --     Most recent vital signs: Vitals:   01/12/23 2130 01/12/23 2147  BP: (!) 106/50 (!) 100/49  Pulse: (!) 120 (!) 104  Resp: 20 (!) 21  Temp:  97.9 F (36.6 C)  SpO2: 100% 100%     General: Awake, no distress.  CV:  Good peripheral perfusion.  Irregular rhythm. Resp:  Normal work of breathing.  Mild tachypnea noted.  Lungs clear. Abd:  No distention.  No tenderness. Other:  Markedly dry mucous membranes.  2+ edema bilateral lower extremities, with  asymmetry and asymmetric thickening of the right lower extremity.  Moderate diffuse erythema with mild tenderness.  No open wounds or fluctuance.   ED Results / Procedures / Treatments   Labs (all labs ordered are listed, but only abnormal results are displayed) Labs Reviewed  BASIC METABOLIC PANEL - Abnormal; Notable for the following components:      Result Value   Sodium 134 (*)    CO2 19 (*)    BUN 37 (*)    Creatinine, Ser 2.46 (*)    Calcium 8.1 (*)    GFR, Estimated 27 (*)    All other components within normal limits  CBC - Abnormal; Notable for the following components:   WBC 3.3 (*)    RBC 3.76 (*)    Hemoglobin 11.3 (*)    HCT 34.6 (*)    Platelets 92 (*)    nRBC 0.6 (*)    All other components within normal limits  LACTIC ACID, PLASMA - Abnormal; Notable for the following components:   Lactic Acid, Venous 2.1 (*)  All other components within normal limits  HEPATIC FUNCTION PANEL - Abnormal; Notable for the following components:   Total Protein 5.8 (*)    Albumin 2.9 (*)    All other components within normal limits  SEDIMENTATION RATE - Abnormal; Notable for the following components:   Sed Rate 38 (*)    All other components within normal limits  CBG MONITORING, ED - Abnormal; Notable for the following components:   Glucose-Capillary 149 (*)    All other components within normal limits  TROPONIN I (HIGH SENSITIVITY) - Abnormal; Notable for the following components:   Troponin I (High Sensitivity) 27 (*)    All other components within normal limits  TROPONIN I (HIGH SENSITIVITY) - Abnormal; Notable for the following components:   Troponin I (High Sensitivity) 28 (*)    All other components within normal limits  CULTURE, BLOOD (ROUTINE X 2)  CULTURE, BLOOD (ROUTINE X 2)  GASTROINTESTINAL PANEL BY PCR, STOOL (REPLACES STOOL CULTURE)  LACTIC ACID, PLASMA  PROCALCITONIN  PHOSPHORUS  MAGNESIUM  URINALYSIS, ROUTINE W REFLEX MICROSCOPIC  PROTIME-INR   CORTISOL-AM, BLOOD  BASIC METABOLIC PANEL  CBC  VITAMIN B12  PROCALCITONIN     EKG Normal sinus rhythm, decode 79.  PR 236, QRS 165, QTc 44.  Specific intraventricular conduction delay.  No acute ST elevations or depressions   RADIOLOGY CXR: Clear CT Head: [name] CT C Spine: no acute fx CT A/P: RP LAD improved, no acute abnormality   I also independently reviewed and agree with radiologist interpretations.   PROCEDURES:  Critical Care performed: No   MEDICATIONS ORDERED IN ED: Medications  levothyroxine (SYNTHROID) tablet 25 mcg (has no administration in time range)  lactated ringers infusion (has no administration in time range)  acetaminophen (TYLENOL) tablet 650 mg (has no administration in time range)    Or  acetaminophen (TYLENOL) suppository 650 mg (has no administration in time range)  ondansetron (ZOFRAN) tablet 4 mg (has no administration in time range)    Or  ondansetron (ZOFRAN) injection 4 mg (has no administration in time range)  heparin injection 5,000 Units (has no administration in time range)  cefTRIAXone (ROCEPHIN) 2 g in sodium chloride 0.9 % 100 mL IVPB (has no administration in time range)  hydrALAZINE (APRESOLINE) injection 5 mg (has no administration in time range)  pravastatin (PRAVACHOL) tablet 40 mg (has no administration in time range)  FLUoxetine (PROZAC) capsule 20 mg (has no administration in time range)  dextrose 50 % solution 50 mL (has no administration in time range)  ferrous sulfate tablet 325 mg (has no administration in time range)  azithromycin (ZITHROMAX) 500 mg in sodium chloride 0.9 % 250 mL IVPB (has no administration in time range)  lactated ringers bolus 1,000 mL (0 mLs Intravenous Stopped 01/12/23 2142)  cefTRIAXone (ROCEPHIN) 2 g in sodium chloride 0.9 % 100 mL IVPB (0 g Intravenous Stopped 01/12/23 1822)  ondansetron (ZOFRAN) injection 4 mg (4 mg Intravenous Given 01/12/23 1727)  vancomycin (VANCOREADY) IVPB 1750 mg/350 mL (0  mg Intravenous Stopped 01/12/23 2145)  lactated ringers bolus 1,000 mL (0 mLs Intravenous Stopped 01/12/23 2142)     IMPRESSION / MDM / ASSESSMENT AND PLAN / ED COURSE  I reviewed the triage vital signs and the nursing notes.                              Differential diagnosis includes, but is not limited to, sepsis, colitis, diverticulitis, enteritis,  UTI, PNA, anemia, dehydration/AKI  Patient's presentation is most consistent with acute presentation with potential threat to life or bodily function.  The patient is on the cardiac monitor to evaluate for evidence of arrhythmia and/or significant heart rate changes  77 yo M with h/o lymphoma, here with n/v/d and generalized weakness. Pt hypotensive here, and exam is concerning for cellulitis with sepsis. IVF, ABX started. LA 2.0. Labs with mild leukopenia. CT A/P obtained 2/2 his n/v/d and is negative. CT Head/ C spine negative. EKG nonischemic. Will admit ofr hypotension, dehydration, possible sepsis 2/2 cellulitis. Broad-spectrum ABX started.    FINAL CLINICAL IMPRESSION(S) / ED DIAGNOSES   Final diagnoses:  Dehydration  Cellulitis, unspecified cellulitis site     Rx / DC Orders   ED Discharge Orders     None        Note:  This document was prepared using Dragon voice recognition software and may include unintentional dictation errors.   Duffy Bruce, MD 01/12/23 2210

## 2023-01-12 NOTE — Assessment & Plan Note (Signed)
Persistent weakness, diarrhea and no appetite with associated nausea and vomiting.  SOB with exertion.  Orthostatic on exam with systolic of 70 - standing for brief period.  Has had multiple falls recently.  Last fall last night.  Hit head.  Received IVFs yesterday.  Worsening kidney function.  Discussed given persistent symptoms and given increased weakness and orthostasis - need for ER/hospital evaluation and treatment.  Pt in agreement.  Sister Manuela Schwartz) notified.  EMS called for nonemergent transfer.

## 2023-01-12 NOTE — ED Triage Notes (Signed)
Pt here with hypotension and a fall last night. Pt states his legs got weak and gave out on him. Pt states he hit the back of his head when he fell, denies blood thinner. Pt also states his bp runs on the low side. Pt denies LOC.

## 2023-01-12 NOTE — H&P (Signed)
History and Physical   Gary [name] [id number] DOB: 1945/11/22 DOA: 01/12/2023  PCP: [name], MD  Outpatient Specialists: Dr. Clayborn Bigness, Dr. [name] Patient coming from: pcp office via EMS  I have personally briefly reviewed patient's old medical records in [place].  Chief Concern: Weakness, fall, hypotension  HPI: [name] is a 77 year old male with history of recurrent stage III mantle cell lymphoma who has completed 43 cycles of Rituxan and Treanda, depression, insulin-dependent diabetes mellitus, hypertension, hyperlipidemia, who presents to the emergency department for chief concerns of generalized weakness and hypotension.  Patient is reported to have nausea and vomiting and diarrhea and this has been ongoing for the last several months  Vitals in the ED showed temperature of 98, respiration rate of 16, heart rate of 108, blood pressure 88/60, SpO2 of 99% on room air.  Serum sodium is 134, potassium 3.5, chloride 101, bicarb 19, BUN of 37, serum creatinine of 2.46, EGFR of 27, nonfasting blood glucose 70, WBC 3.3, hemoglobin 11.3, platelets of 92.  High sensitive troponin is 27.  Lactic acid is 2.1.  Blood cultures x 2 are in process.  ED treatment: Ceftriaxone 2 g IV one-time dose, lactated ringer 1 L bolus, vancomycin. --------------------------- At bedside, he was able to tell me his name, age, current location of hospital ER, current year of 2024.  He reports nausea and vomiting for about 1.5 months and diarrhea.   He denies fever at home, dysuria, hematuria, blood in his stool. He denies chest pain, new shortness of breath. He endorses abdominal pain with diarrhea.   He denies trauma to his person. He endorses coughing in the last two days and the cough is nonproductive or makes him vomits.   Social history: He lives at home with his dog. He denies tobacco use. He normally drinks two beers at bedside to be to sleep and his last etoh drink was 40 days  ago. He has not consumed etoh since he has not been feeling well. He denies recreational drug use.    ROS: Constitutional: no weight change, no fever ENT/Mouth: no sore throat, no rhinorrhea Eyes: no eye pain, no vision changes Cardiovascular: no chest pain, no dyspnea,  + edema, no palpitations Respiratory: + cough, no sputum, no wheezing Gastrointestinal: + nausea, + vomiting, + diarrhea, no constipation Genitourinary: no urinary incontinence, no dysuria, no hematuria Musculoskeletal: no arthralgias, no myalgias Skin: no skin lesions, no pruritus, Neuro: + weakness, no loss of consciousness, no syncope Psych: no anxiety, no depression, + decrease appetite Heme/Lymph: no bruising, no bleeding  ED Course: Discussed with emergency medicine provider, patient requiring hospitalization for chief concerns of SIRS with hypotension.  Assessment/Plan  Principal Problem:   SIRS (systemic inflammatory response syndrome) Active Problems:   Essential hypertension, benign   Hypercholesterolemia   GERD (gastroesophageal reflux disease)   Mantle cell lymphoma   CAD in native artery   Adjustment disorder with depressed mood   Anemia   Hypothyroid   AKI (acute kidney injury)   Assessment and Plan:  * SIRS (systemic inflammatory response syndrome) Patient has elevated lactic acid of 2.1, leukopenia, elevated heart rate, low normal tensive, with possible source of infection, cellulitis of the right leg vs CAP Blood cultures x 2 are in process Will monitor second lactic acid Continue ceftriaxone 2 g IV daily, 6 additional doses have been ordered to complete a 7-day course Ordered chest x-ray, Procalcitonin Status post LR 1 L bolus per EDP, order additional LR 1  L bolus and continue LR infusion at 150 mL/h, 20 hours ordered Added azithromycin 500 mg IV daily, 5 days ordered Admit to inpatient, telemetry cardiac  AKI (acute kidney injury) On CKD 3B Baseline serum creatinine is  1.84-2.04/eGFR 34-38 Presumed secondary to prerenal in setting of poor p.o. intake with concurrent GI loss Status post LR 1 L bolus Ordered additional LR 1 L bolus and continue LR 150 mL/h, 20 hours ordered Repeat BMP in the a.m.  Hypothyroid Home levothyroxine 25 mcg daily before breakfast resumed  Adjustment disorder with depressed mood Fluoxetine 20 mg daily resumed  Hypercholesterolemia Pravastatin 40 mg daily resumed  Essential hypertension, benign Hydralazine 5 mg IV every 8 hours as needed for SBP greater than 180, 4 days ordered  Chart reviewed.   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    Gastroparesis    Graves disease    Hypercholesterolemia    Hypertension    Mantle cell lymphoma 07/2014   Peripheral neuropathy    Shingles    Past Surgical History:  Procedure Laterality Date   CHOLECYSTECTOMY     NEPHRECTOMY     right   PACEMAKER INSERTION N/A 12/07/2017   Procedure: INSERTION PACEMAKER DUAL CHAMBER INITIAL INSERT;  Surgeon: Isaias Cowman, MD;  Location: [facility];  Service: Cardiovascular;  Laterality: N/A;   PORTA CATH INSERTION N/A 11/03/2017   Procedure: PORTA CATH INSERTION;  Surgeon: Katha Cabal, MD;  Location: [facility];  Service: Cardiovascular;  Laterality: N/A;   Social History:  reports that he has quit smoking. His smoking use included cigarettes. He has been exposed to  tobacco smoke. His smokeless tobacco use includes chew. He reports current alcohol use of about 3.0 standard drinks of alcohol per week. He reports that he does not use drugs.  Allergies  Allergen Reactions   Rofecoxib Nausea Only   Family History  Problem Relation Age of Onset   Breast cancer Mother    Diabetes Father    Cancer Father        Lymphoma   Alcohol abuse Maternal Uncle    Diabetes Sister    Heart disease Other        grandfather   Family history: Family history reviewed and not pertinent.  Prior to Admission medications   Medication Sig Start Date End Date Taking? Authorizing Provider  Cholecalciferol (VITAMIN D3) 50 MCG (2000 UT) CAPS Take 2,000 Units by mouth daily. 03/20/20   [provider]  feeding supplement, GLUCERNA SHAKE, (GLUCERNA SHAKE) LIQD Take 237 mLs by mouth 2 (two) times daily between meals. 09/13/21   Swayze, Ava, DO  ferrous sulfate 325 (65 FE) MG tablet Take 325 mg by mouth daily with breakfast.    [provider]  FLUoxetine (PROZAC) 20 MG tablet Take 1 tablet (20 mg total) by mouth daily. 11/13/22   [name], MD  furosemide (LASIX) 40 MG tablet Take 2 tablets in the morning, and 1 tablet in the evening. 08/19/22  [name], MD  glucose 4 GM chewable tablet Chew 1 tablet (4 g total) by mouth once as needed for low blood sugar. 02/09/21   [name], MD  insulin aspart (NOVOLOG) 100 UNIT/ML injection Inject 6 Units into the skin 3 (three) times daily with meals. 09/03/21   Enzo Bi, MD  Insulin Human (INSULIN PUMP) SOLN Inject into the skin.    [provider]  levothyroxine (SYNTHROID) 25 MCG tablet Take 1 tablet (25 mcg total) by mouth daily before breakfast. 03/03/22   [name], MD  lidocaine-prilocaine (EMLA) cream Apply 1 Application topically once. 08/26/22   [provider]  nystatin cream (MYCOSTATIN) APPLY TO AFFECTED AREA TWICE A DAY 11/30/22   [name], MD  potassium chloride  (KLOR-CON) 10 MEQ tablet TAKE 1 TABLET BY MOUTH TWICE A DAY 11/30/22   [name], MD  potassium chloride SA (KLOR-CON M) 20 MEQ tablet Take 1 tablet (20 mEq total) by mouth 2 (two) times daily. 01/08/23   Borders, Kirt Boys, NP  pravastatin (PRAVACHOL) 40 MG tablet TAKE 1 TABLET BY MOUTH EVERY DAY 09/14/22   [name], MD  prochlorperazine (COMPAZINE) 10 MG tablet Take 10 mg by mouth every 6 (six) hours as needed. 12/16/22   [provider]  vitamin B-12 (CYANOCOBALAMIN) 1000 MCG tablet Take 1,000 mcg by mouth daily.    [provider]   Physical Exam: Vitals:   01/12/23 1838 01/12/23 1839 01/12/23 1840 01/12/23 1841  BP:      Pulse:      Resp: 17 (!) 25 17 14   Temp:      TempSrc:      SpO2:    97%  Weight:      Height:       Constitutional: appears chronically ill, NAD, calm, comfortable Eyes: PERRL, lids and conjunctivae normal ENMT: Mucous membranes are moist. Posterior pharynx clear of any exudate or lesions. Age-appropriate dentition. Hearing appropriate Neck: normal, supple, no masses, no thyromegaly Respiratory: clear to auscultation bilaterally, no wheezing, no crackles. Normal respiratory effort. No accessory muscle use.  Cardiovascular: Regular rate and rhythm, no murmurs / rubs / gallops. No extremity edema. 2+ pedal pulses. No carotid bruits.  Abdomen: obese abdomen, no tenderness, no masses palpated, no hepatosplenomegaly. Bowel sounds positive.  Musculoskeletal: no clubbing / cyanosis. No joint deformity upper and lower extremities. Good ROM, no contractures, no atrophy. Normal muscle tone.  Skin: bilateral skin changes, appears chronic, however infection can not be excluded  Neurologic: Sensation intact. Strength 5/5 in all 4.  Psychiatric: Normal judgment and insight. Alert and oriented x 3. Depressed mood.   EKG: independently reviewed, showing sinus rhythm with rate of 79, QTc 484  Chest x-ray on Admission: I personally reviewed and I agree  with radiologist reading as below.  DG Chest 2 View  Result Date: 01/12/2023 CLINICAL DATA:  Weakness EXAM: CHEST - 2 VIEW COMPARISON:  CXR 11/13/22 FINDINGS: Left-sided dual lead cardiac device in place with unchanged lead positioning. Unchanged cardiac and mediastinal contours. Right-sided chest port with tip at the cavoatrial junction, unchanged from prior exam. No pleural effusion. No pneumothorax. Bibasilar atelectasis. No radiographically apparent displaced rib fractures. Vertebral body heights are maintained. Visualized upper abdomen is unremarkable. IMPRESSION: Bibasilar atelectasis.  No new focal airspace opacity. Electronically Signed   By: Marin Roberts M.D.   On: 01/12/2023 16:36   CT ABDOMEN PELVIS WO CONTRAST  Result Date: 01/12/2023 CLINICAL DATA:  Abdominal pain EXAM: CT ABDOMEN AND PELVIS WITHOUT CONTRAST  TECHNIQUE: Multidetector CT imaging of the abdomen and pelvis was performed following the standard protocol without IV contrast. RADIATION DOSE REDUCTION: This exam was performed according to the departmental dose-optimization program which includes automated exposure control, adjustment of the mA and/or kV according to patient size and/or use of iterative reconstruction technique. COMPARISON:  08/07/2022 FINDINGS: Lower chest: Linear bibasilar subsegmental atelectasis or scarring. Enlarged heart and pacer wires noted. Small hiatal hernia Hepatobiliary: No focal liver abnormality is seen. Status post cholecystectomy. No biliary dilatation. Pancreas: Unremarkable. No pancreatic ductal dilatation or surrounding inflammatory changes. Spleen: Normal in size without focal abnormality. Adrenals/Urinary Tract: Right kidney is absent. No adrenal lesions. No nephrolithiasis or hydronephrosis. Unremarkable urinary bladder Stomach/Bowel: Stomach is within normal limits. Appendix appears normal. No evidence of bowel wall thickening, distention, or inflammatory changes. Vascular/Lymphatic: Atheromatous  changes. Shotty small retroperitoneal nodes are identified. The enlarged retroperitoneal, pelvic, and iliac nodes observed previously have nearly resolved. One left para-aortic node that previously measured 1.7 cm now measures 1.0 cm for example. Reproductive: Prostate is unremarkable. Other: No abdominal wall hernia or abnormality. No abdominopelvic ascites. Musculoskeletal: Thoracolumbar degenerative changes. No acute osseous abnormalities. IMPRESSION: 1. Retroperitoneal adenopathy present on prior study has nearly resolved. 2. No acute abdominal or pelvic pathology identified. Electronically Signed   By: Sammie Bench M.D.   On: 01/12/2023 16:31   CT HEAD WO CONTRAST (5MM)  Result Date: 01/12/2023 CLINICAL DATA:  Trauma EXAM: CT HEAD WITHOUT CONTRAST CT CERVICAL SPINE WITHOUT CONTRAST TECHNIQUE: Multidetector CT imaging of the head and cervical spine was performed following the standard protocol without intravenous contrast. Multiplanar CT image reconstructions of the cervical spine were also generated. RADIATION DOSE REDUCTION: This exam was performed according to the departmental dose-optimization program which includes automated exposure control, adjustment of the mA and/or kV according to patient size and/or use of iterative reconstruction technique. COMPARISON:  CT Head 08/29/21 FINDINGS: CT HEAD FINDINGS Brain: No evidence of acute infarction, hemorrhage, hydrocephalus, extra-axial collection or mass lesion/mass effect. 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 Sequela of moderate chronic microvascular ischemic change. Generalized volume loss. Vascular: No hyperdense vessel or unexpected calcification. Skull: Normal. Negative for fracture or focal lesion. Sinuses/Orbits: No middle ear or mastoid effusion. Paranasal sinuses are clear. Bilateral lens replacement. Orbits are otherwise unremarkable. Other: None. CT CERVICAL SPINE FINDINGS Alignment: Trace anterolisthesis of C3 on C4 and C4 on C5  Skull base and vertebrae: No acute fracture. No primary bone lesion or focal pathologic process. Unchanged appearance of the superior endplate of C7, likely degenerative. Soft tissues and spinal canal: No prevertebral fluid or swelling. No visible canal hematoma. Disc levels:  No evidence of high-grade spinal canal stenosis. Upper chest: Negative. Other: None IMPRESSION: 1. No acute intracranial abnormality. 2. No acute cervical spine fracture or traumatic malalignment. Electronically Signed   By: Marin Roberts M.D.   On: 01/12/2023 16:21   CT Cervical Spine Wo Contrast  Result Date: 01/12/2023 CLINICAL DATA:  Trauma EXAM: CT HEAD WITHOUT CONTRAST CT CERVICAL SPINE WITHOUT CONTRAST TECHNIQUE: Multidetector CT imaging of the head and cervical spine was performed following the standard protocol without intravenous contrast. Multiplanar CT image reconstructions of the cervical spine were also generated. RADIATION DOSE REDUCTION: This exam was performed according to the departmental dose-optimization program which includes automated exposure control, adjustment of the mA and/or kV according to patient size and/or use of iterative reconstruction technique. COMPARISON:  CT Head 08/29/21 FINDINGS: CT HEAD FINDINGS Brain: No evidence of acute infarction, hemorrhage, hydrocephalus, extra-axial  collection or mass lesion/mass effect. 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 Sequela of moderate chronic microvascular ischemic change. Generalized volume loss. Vascular: No hyperdense vessel or unexpected calcification. Skull: Normal. Negative for fracture or focal lesion. Sinuses/Orbits: No middle ear or mastoid effusion. Paranasal sinuses are clear. Bilateral lens replacement. Orbits are otherwise unremarkable. Other: None. CT CERVICAL SPINE FINDINGS Alignment: Trace anterolisthesis of C3 on C4 and C4 on C5 Skull base and vertebrae: No acute fracture. No primary bone lesion or focal pathologic process. Unchanged  appearance of the superior endplate of C7, likely degenerative. Soft tissues and spinal canal: No prevertebral fluid or swelling. No visible canal hematoma. Disc levels:  No evidence of high-grade spinal canal stenosis. Upper chest: Negative. Other: None IMPRESSION: 1. No acute intracranial abnormality. 2. No acute cervical spine fracture or traumatic malalignment. Electronically Signed   By: Marin Roberts M.D.   On: 01/12/2023 16:21    Labs on Admission: I have personally reviewed following labs CBC: Recent Labs  Lab 01/07/23 1020 01/12/23 1459  WBC 3.1* 3.3*  NEUTROABS 1.6*  --   HGB 11.5* 11.3*  HCT 34.0* 34.6*  MCV 90.7 92.0  PLT 77* 92*   Basic Metabolic Panel: Recent Labs  Lab 01/07/23 1020 01/08/23 1311 01/11/23 1345 01/12/23 1459 01/12/23 1828  NA 136 136 132* 134*  --   K 2.8* 3.0* 3.3* 3.5  --   CL 102 99 100 101  --   CO2 22 22 20* 19*  --   GLUCOSE 65* 76 76 70  --   BUN 29* 27* 28* 37*  --   CREATININE 1.88* 1.84* 2.04* 2.46*  --   CALCIUM 7.7* 8.3* 7.7* 8.1*  --   MG 1.7  --   --   --  1.7  PHOS  --   --   --   --  3.2   GFR: Estimated Creatinine Clearance: 30.2 mL/min (A) (by C-G formula based on SCr of 2.46 mg/dL (H)). Liver Function Tests: Recent Labs  Lab 01/07/23 1020 01/12/23 1828  AST 22 20  ALT 8 8  ALKPHOS 90 82  BILITOT 0.4 0.9  PROT 6.3* 5.8*  ALBUMIN 3.3* 2.9*   Urine analysis:    Component Value Date/Time   COLORURINE YELLOW (A) 08/29/2021 0221   APPEARANCEUR CLEAR (A) 08/29/2021 0221   APPEARANCEUR Cloudy 06/13/2014 0823   LABSPEC 1.019 08/29/2021 0221   LABSPEC 1.018 06/13/2014 0823   PHURINE 5.0 08/29/2021 0221   GLUCOSEU NEGATIVE 08/29/2021 0221   GLUCOSEU 50 mg/dL 06/13/2014 0823   HGBUR SMALL (A) 08/29/2021 0221   BILIRUBINUR NEGATIVE 08/29/2021 0221   BILIRUBINUR Negative 06/13/2014 0823   KETONESUR 5 (A) 08/29/2021 0221   PROTEINUR NEGATIVE 08/29/2021 0221   NITRITE NEGATIVE 08/29/2021 0221   LEUKOCYTESUR NEGATIVE  08/29/2021 0221   LEUKOCYTESUR 2+ 06/13/2014 0823   This document was prepared using Dragon Voice Recognition software and may include unintentional dictation errors.  Dr. Tobie Poet [facility]  If 7PM-7AM, please contact overnight-coverage provider If 7AM-7PM, please contact day coverage provider [email or web address]  01/12/2023, 7:40 PM

## 2023-01-12 NOTE — Progress Notes (Signed)
Subjective:    [name], male    DOB: 1945-12-02, 77 y.o.   MRN: [id number]  Patient here for  Chief Complaint  Patient presents with   Medical Management of Chronic Issues    HPI Here to follow up regarding diabetes, hypertension and hypercholesterolemia.  Followed by oncology - recurrent stage III mantle cell lymphoma.  PET 12/10/22 - significant interval disease improvement.  Was seen 01/07/23 - increased diarrhea.  Started in January.  5-6 stools per day.  Received gentle fluid hydration and IV repletion of potassium - yesterday.  Comes in today, persistent weakness.  SOB with exertion.  No appetite.  Not eating.  Has had persistent diarrhea.   Stools studies negative. Despite fluids yesterday, still weak - fell last night.  Hit head.  Bruised shoulders.  Laceration - right elbow and right wrist.     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    Gastroparesis    Graves disease    Hypercholesterolemia    Hypertension    Mantle cell lymphoma 07/2014   Peripheral neuropathy    Shingles    Past Surgical History:  Procedure Laterality Date   CHOLECYSTECTOMY     NEPHRECTOMY     right   PACEMAKER INSERTION N/A 12/07/2017   Procedure: INSERTION PACEMAKER DUAL CHAMBER INITIAL INSERT;  Surgeon: Marcina Millard, MD;  Location: [facility] ORS;  Service: Cardiovascular;  Laterality: N/A;   PORTA CATH INSERTION N/A 11/03/2017   Procedure: PORTA CATH INSERTION;  Surgeon: Renford Dills, MD;  Location: [facility] INVASIVE CV LAB;  Service: Cardiovascular;  Laterality: N/A;   Family History  Problem Relation Age of Onset   Breast cancer Mother    Diabetes Father    Cancer Father        Lymphoma   Alcohol abuse Maternal Uncle    Diabetes Sister    Heart disease Other        grandfather   Social History   Socioeconomic History   Marital  status: Widowed    Spouse name: Not on file   Number of children: 1   Years of education: 28   Highest education level: 12th grade  Occupational History   Occupation: Retired  Tobacco Use   Smoking status: Former    Types: Cigarettes    Passive exposure: Past   Smokeless tobacco: Current    Types: Chew  Vaping Use   Vaping Use: Never used  Substance and Sexual Activity   Alcohol use: Yes    Alcohol/week: 3.0 standard drinks of alcohol    Types: 3 Cans of beer per week    Comment: nightly   Drug use: Never   Sexual activity: Not Currently  Other Topics Concern   Not on file  Social History Narrative   Not on file   Social Determinants of Health   Financial Resource Strain: Low Risk  (03/10/2022)   Overall Financial Resource Strain (CARDIA)    Difficulty of Paying Living Expenses: Not hard at all  Food Insecurity: No Food Insecurity (01/12/2023)   Hunger Vital Sign    Worried About Running Out of Food in the Last Year: Never true    Ran Out of Food in the Last Year: Never true  Transportation Needs: No Transportation Needs (01/12/2023)   PRAPARE - Administrator, Civil Service (Medical):  No    Lack of Transportation (Non-Medical): No  Recent Concern: Transportation Needs - Unmet Transportation Needs (01/11/2023)   PRAPARE - Transportation    Lack of Transportation (Medical): Yes    Lack of Transportation (Non-Medical): Yes  Physical Activity: Inactive (03/10/2022)   Exercise Vital Sign    Days of Exercise per Week: 0 days    Minutes of Exercise per Session: 0 min  Stress: No Stress Concern Present (03/10/2022)   [employer] of Occupational Health - Occupational Stress Questionnaire    Feeling of Stress : Only a little  Social Connections: Socially Isolated (03/10/2022)   Social Connection and Isolation Panel [NHANES]    Frequency of Communication with Friends and Family: More than three times a week    Frequency of Social Gatherings with Friends and Family:  Twice a week    Attends Religious Services: Never    Database administrator or Organizations: No    Attends Banker Meetings: Never    Marital Status: Widowed     Review of Systems  Constitutional:  Positive for appetite change and fatigue.       Weight loss.    HENT:  Negative for congestion and sinus pressure.   Respiratory:  Positive for shortness of breath. Negative for cough and chest tightness.   Cardiovascular:  Positive for leg swelling. Negative for chest pain and palpitations.  Gastrointestinal:  Positive for diarrhea, nausea and vomiting.  Genitourinary:  Negative for difficulty urinating and dysuria.  Musculoskeletal:  Negative for joint swelling and myalgias.  Skin:  Negative for color change and rash.  Neurological:        Some intermittent dizziness/light headedness   Psychiatric/Behavioral:  Negative for agitation and dysphoric mood.        Objective:     BP (!) 88/60   Pulse (!) 108   Temp 98 F (36.7 C)   Resp 16   Ht 5\' 10"  (1.778 m)   Wt 205 lb (93 kg)   SpO2 99%   BMI 29.41 kg/m  Wt Readings from Last 3 Encounters:  01/14/23 218 lb 11.1 oz (99.2 kg)  01/12/23 205 lb (93 kg)  01/07/23 201 lb (91.2 kg)    Physical Exam Vitals reviewed.  Constitutional:      Appearance: He is well-developed.     Comments: Appears not to feel well. Difficult to stand - weak with standing.   HENT:     Head: Normocephalic and atraumatic.     Right Ear: External ear normal.     Left Ear: External ear normal.     Mouth/Throat:     Pharynx: No oropharyngeal exudate or posterior oropharyngeal erythema.  Eyes:     General: No scleral icterus.       Right eye: No discharge.        Left eye: No discharge.     Conjunctiva/sclera: Conjunctivae normal.  Cardiovascular:     Rate and Rhythm: Regular rhythm.     Comments: Pulse rate 108.  Pulmonary:     Effort: Pulmonary effort is normal. No respiratory distress.     Breath sounds: Normal breath sounds.   Abdominal:     General: Bowel sounds are normal.     Palpations: Abdomen is soft.     Tenderness: There is no abdominal tenderness.  Musculoskeletal:        General: No tenderness.     Cervical back: Neck supple. No tenderness.     Comments: Wraps - in  place.   Lymphadenopathy:     Cervical: No cervical adenopathy.  Skin:    Findings: No erythema or rash.  Neurological:     Mental Status: He is alert.  Psychiatric:        Mood and Affect: Mood normal.        Behavior: Behavior normal.      Facility-Administered Encounter Medications as of 01/12/2023  Medication   heparin lock flush 100 unit/mL   sodium chloride flush (NS) 0.9 % injection 10 mL   sodium chloride flush (NS) 0.9 % injection 10 mL   Outpatient Encounter Medications as of 01/12/2023  Medication Sig   Cholecalciferol (VITAMIN D3) 50 MCG (2000 UT) CAPS Take 2,000 Units by mouth daily.   feeding supplement, GLUCERNA SHAKE, (GLUCERNA SHAKE) LIQD Take 237 mLs by mouth 2 (two) times daily between meals.   ferrous sulfate 325 (65 FE) MG tablet Take 325 mg by mouth daily with breakfast.   FLUoxetine (PROZAC) 20 MG tablet Take 1 tablet (20 mg total) by mouth daily.   furosemide (LASIX) 40 MG tablet Take 2 tablets in the morning, and 1 tablet in the evening.   glucose 4 GM chewable tablet Chew 1 tablet (4 g total) by mouth once as needed for low blood sugar.   insulin aspart (NOVOLOG) 100 UNIT/ML injection Inject 6 Units into the skin 3 (three) times daily with meals. (Patient not taking: Reported on 01/12/2023)   Insulin Human (INSULIN PUMP) SOLN Inject into the skin.   levothyroxine (SYNTHROID) 25 MCG tablet Take 1 tablet (25 mcg total) by mouth daily before breakfast.   lidocaine-prilocaine (EMLA) cream Apply 1 Application topically once.   nystatin cream (MYCOSTATIN) APPLY TO AFFECTED AREA TWICE A DAY   potassium chloride (KLOR-CON) 10 MEQ tablet TAKE 1 TABLET BY MOUTH TWICE A DAY   potassium chloride SA (KLOR-CON M) 20 MEQ  tablet Take 1 tablet (20 mEq total) by mouth 2 (two) times daily.   pravastatin (PRAVACHOL) 40 MG tablet TAKE 1 TABLET BY MOUTH EVERY DAY   prochlorperazine (COMPAZINE) 10 MG tablet Take 10 mg by mouth every 6 (six) hours as needed.   vitamin B-12 (CYANOCOBALAMIN) 1000 MCG tablet Take 1,000 mcg by mouth daily.   [DISCONTINUED] bacitracin 500 UNIT/GM ointment Apply 1 application topically daily. (Patient not taking: Reported on 01/07/2023)     Lab Results  Component Value Date   WBC 2.4 (L) 01/15/2023   HGB 9.5 (L) 01/15/2023   HCT 28.4 (L) 01/15/2023   PLT 85 (L) 01/15/2023   GLUCOSE 106 (H) 01/16/2023   CHOL 123 05/11/2022   TRIG 52.0 05/11/2022   HDL 52.90 05/11/2022   LDLCALC 60 05/11/2022   ALT 8 01/14/2023   AST 17 01/14/2023   NA 136 01/16/2023   K 3.6 01/16/2023   CL 104 01/16/2023   CREATININE 1.40 (H) 01/16/2023   BUN 32 (H) 01/16/2023   CO2 23 01/16/2023   TSH 7.715 (H) 01/15/2023   PSA 1.98 05/11/2022   INR 2.0 (H) 01/15/2023   HGBA1C 6.4 12/02/2022   MICROALBUR <0.7 05/11/2022    No results found.     Assessment & Plan:  Orthostatic hypotension Assessment & Plan: Persistent weakness, diarrhea and no appetite with associated nausea and vomiting.  SOB with exertion.  Orthostatic on exam with systolic of 70 - standing for brief period.  Has had multiple falls recently.  Last fall last night.  Hit head.  Received IVFs yesterday.  Worsening kidney function.  Discussed  given persistent symptoms and given increased weakness and orthostasis - need for ER/hospital evaluation and treatment.  Pt in agreement.  Sister [name]) notified.  EMS called for nonemergent transfer.    Adjustment disorder with depressed mood Assessment & Plan: Stable. Continue prozac.    Anemia, unspecified type Assessment & Plan: Being followed by cancer center.     Aortic atherosclerosis Assessment & Plan: Continue pravastatin.    CAD in native artery Assessment & Plan: Continue risk  factor modification. No chest pain.  Weakness - orthostatic.  ER/hospital evaluation as outlined.    Cardiomyopathy, idiopathic Assessment & Plan: Taking lasix. Lower extremity swelling improved overall with wraps.  Wraps in place.  Now with orthostatic hypotension.  ER/hospital evaluation/w/up as outlined.  Lasix dosing may need to be adjusted until can sort through etiology of weakness/hypotension, etc.     Chronic systolic CHF (congestive heart failure) Assessment & Plan: Has history of chronic CHF.  Has been on lasix.  Recent issues with orthostatic hypotension.  Noted weakness and increased sob today.  ER/hospital evaluation as outlined.  EMS called and transported him to ER. ER notified.     Stage 3b chronic kidney disease Assessment & Plan: Has been evaluated by Dr Thedore Mins.  Avoid antiinflammatories.  Follow metabolic panel.    Depression, major, single episode, mild Assessment & Plan: Prozac 20mg  q day.  Follow.    Type 2 diabetes mellitus with diabetic peripheral angiopathy without gangrene, without long-term current use of insulin Assessment & Plan: Being followed by endocrinology.  Last a1c 7.2.  Follow met b and a1c.    Pulmonary emphysema, unspecified emphysema type Assessment & Plan: SOB with exertion as outlined.  Weakness.  To ER for evaluation as outlined.  EMS notified.    Essential hypertension, benign Assessment & Plan: Orthostatic hypotension in office.  Brief standing - systolic 70.  EMS called to transport him to ER.  ER notified.    Fall, initial encounter Assessment & Plan: Frequent falls.  Last - yesterday evening despite fluids in office yesterday.  Did hit back of head.  Denies headache.  To ER for further w/up and evaluation and question of need for scanning.     Gastroesophageal reflux disease, unspecified whether esophagitis present Assessment & Plan: No upper symptoms reported.     Hypercholesterolemia Assessment & Plan: Continue  pravastatin.  Follow lipid panel and liver function tests.    Mantle cell lymphoma of lymph nodes of multiple regions Assessment & Plan: Followed by oncology. Patient has been off treatment with Kathi Der and Rituxan since January. Last PET scan showed significant interval disease improvement so unlikely that diarrhea would stem from lymphoma or cancer treatment at this point. Stool studies negative.  Unclear etiology.  Diarrhea does appear to be improved.  Decreased appetite.  Hypotension.  To ER for further w/up and evaluation.    Type 1 diabetes mellitus with diabetic microalbuminuria Assessment & Plan: Seeing Dr Gershon Crane.  Insulin pump.  Not having the increased low sugars as he was previously.  Follow     I spent 45 minutes with the patient . Time spent discussing his current concerns and symptoms. Specifically time spent discussing increased weakness and decreased appetite with associated sob.  Time also spent discussing further w/up, evaluation and treatment.     Dale Gilbert, MD

## 2023-01-12 NOTE — ED Notes (Signed)
First nurse note: Pt here via [facility] with c/o of hypotension, fall last night. Pt being seen for lymphoma but no TX since last Sept.   107/68 HR Irregular: 88 98% RA

## 2023-01-12 NOTE — Telephone Encounter (Signed)
Patient was a Dr. Percell Boston patient from 2017. Let Autumn know if you are able to see the patient and/or if you want him to go back to [facility] since he was a patient of there's in 2017. Thanks.

## 2023-01-12 NOTE — Assessment & Plan Note (Addendum)
Patient has elevated lactic acid of 2.1, leukopenia, elevated heart rate, low normal tensive, with possible source of infection, cellulitis of the right leg vs CAP Blood cultures x 2 are in process Will monitor second lactic acid Continue ceftriaxone 2 g IV daily, 6 additional doses have been ordered to complete a 7-day course Ordered chest x-ray, Procalcitonin Status post LR 1 L bolus per EDP, order additional LR 1 L bolus and continue LR infusion at 150 mL/h, 20 hours ordered Added azithromycin 500 mg IV daily, 5 days ordered Admit to inpatient, telemetry cardiac

## 2023-01-12 NOTE — Assessment & Plan Note (Signed)
-   Hydralazine 5 mg IV every 8 hours as needed for SBP greater than 180, 4 days ordered 

## 2023-01-12 NOTE — Hospital Course (Addendum)
[name] is a 77 year old male with history of recurrent stage III mantle cell lymphoma who has completed 43 cycles of Rituxan and Treanda, depression, insulin-dependent diabetes mellitus, hypertension, hyperlipidemia, who presents to the emergency department for chief concerns of generalized weakness and hypotension.  Patient is reported to have nausea and vomiting and diarrhea and this has been ongoing for the last several months  Vitals in the ED showed temperature of 98, respiration rate of 16, heart rate of 108, blood pressure 88/60, SpO2 of 99% on room air.  Serum sodium is 134, potassium 3.5, chloride 101, bicarb 19, BUN of 37, serum creatinine of 2.46, EGFR of 27, nonfasting blood glucose 70, WBC 3.3, hemoglobin 11.3, platelets of 92.  High sensitive troponin is 27.  Lactic acid is 2.1.  Blood cultures x 2 are in process.  ED treatment: Ceftriaxone 2 g IV one-time dose, lactated ringer 1 L bolus, vancomycin.  4/3:Labs with slight worsening of metabolic acidosis, creatinine with mild improvement to 2.43, worsening leukopenia and thrombocytopenia but all cell lines decreased-some dilutional effect.  On imaging which include CT head, cervical spine, CT abdominal and pelvis were negative for any acute abnormality.  INR at 3.5-discontinue DVT prophylaxis with heparin-SCDs ordered. Patient again becoming hypotensive-give him 1 more liter of bolus and also starting midodrine. Still no obvious source of infection as blood cultures remain negative in 24 hours. Will continue broad-spectrum antibiotics.  Concern of lower extremity cellulitis.  4/4: Blood pressure remained on softer side, midodrine dose was increased to 10 mg, 3 times daily.  GI pathogen panel negative, C. difficile negative, Blood cultures remain negative.  Labs with very slowly improving creatinine to 2.33, worsening now anion gap metabolic acidosis with bicarb at 14, mildly elevated T. bili at 1.4.  Lactic acid was normal but  found to have elevated beta hydroxybutyric acid, concern of developing DKA. Rechecking BMP if Remained open we will start him on Endo tool. INR still elevated, APTT started improving.  DIC labs negative.  Vitamin K levels pending  Addendum.  Repeat BMP remained high anion gap metabolic acidosis with elevated blood glucose level.  Patient seems like in DKA.  Transferring to stepdown and starting on Endo tool.  4/5: Blood pressure remained little soft.  CBG improved and gap closed.  [name]  He was later managed with resistant sliding scale every 4 hourly.  Potassium was also repleted overnight.  A.m. labs with some improvement of creatinine to 1.99, bicarb still low at 20-infusion was discontinued overnight and he is now being restarted on p.o. bicarb supplement.  TSH found to be elevated at 7.715-Home Synthroid dose was increased to 50. Esophagogram was ordered by GI today.  4/6: Blood pressure remained on softer side.  CBG improved, converted to SSI with meals along with 10 units of Smeglee.  Esophageal gram with limited study due to patient's inability to position properly but did show patulous esophagus with dysmotility and spontaneous reflex.  Potentially GEJ narrowing.  Will need EGD after improvement in current acute illness.  GI pathogen panel negative.  4/7: Patient slowly improving.  GI planning EGD tomorrow with Dr. Timothy Lassousso.  Awaiting SNF placement.  4/8: Hemodynamically stable.  EGD today shows mild narrowing of GEJ, which was dilated with good results.  GI is recommending twice daily PPI and follow-up with primary GI as outpatient. Completed a course of antibiotics for concern of pneumonia.  Will continue  on p.o. linezolid for total of 7 days.  4/9:Vitals stable.  Had 1 episode of hypoglycemia with CBG at 66 this morning.  Patient was n.p.o. most of the day yesterday.  Improved p.o. intake and regurgitation  today. 4/10 : Nursing reported loose watery diarrhea last evening but upon further discussion with patient he has been having ongoing diarrhea for several months as well as dysphagia and associated weight loss due to poor p.o. intake.  Holding off C. difficile testing for now per discussion with ID. awaiting SNF placement.  Palliative care consult.  Stopping Zyvox 4/11: Reporting chronic diarrhea since start of chemotherapy.  Cutting back on insulin Semglee as blood sugar still running on the lower side 4/12: Reevaluated by GI for chronic diarrhea and also had a CT of abdomen and pelvis which does not show any acute pathology.  Added cholestyramine and probiotic/lactobacillus.  Ordered soft cervical collar per PT recommendation.  Give one-time dose of 80 mg of IV Lasix as he seems fluid overload. SNF on Monday 4/13: No bowel movement reported.  Discontinue telemetry.  Lasix 80 mg IV once 4/14: Reports only 1 bowel movement since yesterday

## 2023-01-12 NOTE — Assessment & Plan Note (Signed)
-   Pravastatin 40 mg daily resumed 

## 2023-01-12 NOTE — Telephone Encounter (Signed)
Phone call to Manuela Schwartz, pt's sister to let her know that pt's rolling walker is here in the office.  EMS did not have room on the ambulance to take it with them.  Left office number to call back with questions.  Rolling walker is located near the bathroom in Pod B.

## 2023-01-12 NOTE — ED Notes (Addendum)
Provider notified of labile BP's via secure chat, rechecked multiple times. 2L of NS have completed, charge aware

## 2023-01-12 NOTE — Progress Notes (Signed)
Pharmacy Antibiotic Note  Gary [name] is a 77 y.o. male admitted on 01/12/2023 with cellulitis.  Pharmacy has been consulted for vancomycin dosing.  Plan: Vancomycin 1750 mg IV x 1 ordered.  Will use variable dosing.  Scr has risen from 1.84 on 3/29 to 2.46 on 4/2.  Height: 5\' 10"  (177.8 cm) Weight: 93 kg (205 lb 0.4 oz) IBW/kg (Calculated) : 73  Temp (24hrs), Avg:97.8 F (36.6 C), Min:97.6 F (36.4 C), Max:98 F (36.7 C)  Recent Labs  Lab 01/07/23 1020 01/08/23 1311 01/11/23 1345 01/12/23 1459 01/12/23 1546  WBC 3.1*  --   --  3.3*  --   CREATININE 1.88* 1.84* 2.04* 2.46*  --   LATICACIDVEN  --   --   --   --  2.1*    Estimated Creatinine Clearance: 30.2 mL/min (A) (by C-G formula based on SCr of 2.46 mg/dL (H)).    Allergies  Allergen Reactions   Rofecoxib Nausea Only    Antimicrobials this admission: Vancomycin 4/2 >>  CRO 4/2 >>   Dose adjustments this admission: N/A  Microbiology results: 4/2 BCx: pending  Thank you for allowing pharmacy to be a part of this patient's care.  Alison Murray 01/12/2023 5:43 PM

## 2023-01-12 NOTE — Assessment & Plan Note (Signed)
-   Fluoxetine 20 mg daily resumed 

## 2023-01-12 NOTE — Telephone Encounter (Signed)
Patients sister calling to schedule an urgent appointment for the patient. Can you advise where to schedule?  *URGENT REFERRAL Diagnosis: Chronic diarrhea, mantle cell lymphoma of lymph nodes of multiple regions

## 2023-01-12 NOTE — Assessment & Plan Note (Signed)
On CKD 3B Baseline serum creatinine is 1.84-2.04/eGFR 34-38 Renal functions seems stable, did received IV fluid including bolus and infusion. -Monitor renal function -Avoid nephrotoxins

## 2023-01-12 NOTE — Consult Note (Signed)
PHARMACY -  BRIEF ANTIBIOTIC NOTE   Pharmacy has received consult(s) for vancomycin from an ED provider.  The patient's profile has been reviewed for ht/wt/allergies/indication/available labs.    One time order(s) placed for vancomycin 1750 mg IV   Further antibiotics/pharmacy consults should be ordered by admitting physician if indicated.                       Thank you, Alison Murray 01/12/2023  5:08 PM

## 2023-01-12 NOTE — Code Documentation (Signed)
CODE SEPSIS - PHARMACY COMMUNICATION  **Broad Spectrum Antibiotics should be administered within 1 hour of Sepsis diagnosis**  Time Code Sepsis Called/Page Received: 1732  Antibiotics Ordered: ceftriaxone, vancomycin  Time of 1st antibiotic administration: 1727, ordered prior to code  Additional action taken by pharmacy: N/A      Alison Murray ,PharmD Clinical Pharmacist  01/12/2023  6:06 PM

## 2023-01-12 NOTE — ED Notes (Signed)
Pt adamant about having a bowel movement before getting into bed.

## 2023-01-13 ENCOUNTER — Encounter (INDEPENDENT_AMBULATORY_CARE_PROVIDER_SITE_OTHER): Payer: Medicare Other

## 2023-01-13 DIAGNOSIS — N179 Acute kidney failure, unspecified: Secondary | ICD-10-CM

## 2023-01-13 DIAGNOSIS — E039 Hypothyroidism, unspecified: Secondary | ICD-10-CM

## 2023-01-13 DIAGNOSIS — I10 Essential (primary) hypertension: Secondary | ICD-10-CM

## 2023-01-13 DIAGNOSIS — R6510 Systemic inflammatory response syndrome (SIRS) of non-infectious origin without acute organ dysfunction: Secondary | ICD-10-CM | POA: Diagnosis not present

## 2023-01-13 DIAGNOSIS — R791 Abnormal coagulation profile: Secondary | ICD-10-CM | POA: Diagnosis not present

## 2023-01-13 DIAGNOSIS — L8990 Pressure ulcer of unspecified site, unspecified stage: Secondary | ICD-10-CM | POA: Diagnosis present

## 2023-01-13 LAB — GLUCOSE, CAPILLARY
Glucose-Capillary: 140 mg/dL — ABNORMAL HIGH (ref 70–99)
Glucose-Capillary: 174 mg/dL — ABNORMAL HIGH (ref 70–99)
Glucose-Capillary: 199 mg/dL — ABNORMAL HIGH (ref 70–99)
Glucose-Capillary: 201 mg/dL — ABNORMAL HIGH (ref 70–99)
Glucose-Capillary: 207 mg/dL — ABNORMAL HIGH (ref 70–99)
Glucose-Capillary: 261 mg/dL — ABNORMAL HIGH (ref 70–99)

## 2023-01-13 LAB — D-DIMER, QUANTITATIVE: D-Dimer, Quant: 0.5 ug/mL-FEU (ref 0.00–0.50)

## 2023-01-13 LAB — CBC
HCT: 31.3 % — ABNORMAL LOW (ref 39.0–52.0)
Hemoglobin: 10.2 g/dL — ABNORMAL LOW (ref 13.0–17.0)
MCH: 30.1 pg (ref 26.0–34.0)
MCHC: 32.6 g/dL (ref 30.0–36.0)
MCV: 92.3 fL (ref 80.0–100.0)
Platelets: 62 10*3/uL — ABNORMAL LOW (ref 150–400)
RBC: 3.39 MIL/uL — ABNORMAL LOW (ref 4.22–5.81)
RDW: 14.6 % (ref 11.5–15.5)
WBC: 2.3 10*3/uL — ABNORMAL LOW (ref 4.0–10.5)
nRBC: 0.9 % — ABNORMAL HIGH (ref 0.0–0.2)

## 2023-01-13 LAB — BASIC METABOLIC PANEL
Anion gap: 15 (ref 5–15)
BUN: 39 mg/dL — ABNORMAL HIGH (ref 8–23)
CO2: 16 mmol/L — ABNORMAL LOW (ref 22–32)
Calcium: 7.6 mg/dL — ABNORMAL LOW (ref 8.9–10.3)
Chloride: 101 mmol/L (ref 98–111)
Creatinine, Ser: 2.43 mg/dL — ABNORMAL HIGH (ref 0.61–1.24)
GFR, Estimated: 27 mL/min — ABNORMAL LOW (ref >60)
Glucose, Bld: 235 mg/dL — ABNORMAL HIGH (ref 70–99)
Potassium: 4.2 mmol/L (ref 3.5–5.1)
Sodium: 132 mmol/L — ABNORMAL LOW (ref 135–145)

## 2023-01-13 LAB — TECHNOLOGIST SMEAR REVIEW
Clinical Information: ELEVATED
Plt Morphology: DECREASED

## 2023-01-13 LAB — MRSA NEXT GEN BY PCR, NASAL: MRSA by PCR Next Gen: NOT DETECTED

## 2023-01-13 LAB — URINALYSIS, ROUTINE W REFLEX MICROSCOPIC
Bilirubin Urine: NEGATIVE
Glucose, UA: NEGATIVE mg/dL
Hgb urine dipstick: NEGATIVE
Ketones, ur: 5 mg/dL — AB
Leukocytes,Ua: NEGATIVE
Nitrite: NEGATIVE
Protein, ur: NEGATIVE mg/dL
Specific Gravity, Urine: 1.015 (ref 1.005–1.030)
pH: 5 (ref 5.0–8.0)

## 2023-01-13 LAB — PROTIME-INR
INR: 3.5 — ABNORMAL HIGH (ref 0.8–1.2)
Prothrombin Time: 34.6 seconds — ABNORMAL HIGH (ref 11.4–15.2)

## 2023-01-13 LAB — VANCOMYCIN, RANDOM: Vancomycin Rm: 14 ug/mL

## 2023-01-13 LAB — CORTISOL-AM, BLOOD: Cortisol - AM: 20.2 ug/dL (ref 6.7–22.6)

## 2023-01-13 LAB — APTT: aPTT: >200 seconds (ref 24–36)

## 2023-01-13 LAB — PROCALCITONIN: Procalcitonin: 1.94 ng/mL

## 2023-01-13 LAB — FIBRINOGEN: Fibrinogen: 419 mg/dL (ref 210–475)

## 2023-01-13 MED ORDER — MIDODRINE HCL 5 MG PO TABS
5.0000 mg | ORAL_TABLET | Freq: Three times a day (TID) | ORAL | Status: DC
  Filled 2023-01-13: qty 1

## 2023-01-13 MED ORDER — MIDODRINE HCL 5 MG PO TABS
5.0000 mg | ORAL_TABLET | Freq: Three times a day (TID) | ORAL | Status: DC
  Administered 2023-01-13: 5 mg via ORAL
  Filled 2023-01-13: qty 1

## 2023-01-13 MED ORDER — SODIUM CHLORIDE 0.9 % IV BOLUS
1000.0000 mL | Freq: Once | INTRAVENOUS | Status: AC
  Administered 2023-01-13: 1000 mL via INTRAVENOUS

## 2023-01-13 MED ORDER — INSULIN ASPART 100 UNIT/ML IJ SOLN
0.0000 [IU] | Freq: Every day | INTRAMUSCULAR | Status: DC
  Administered 2023-01-13: 2 [IU] via SUBCUTANEOUS
  Filled 2023-01-13: qty 1

## 2023-01-13 MED ORDER — ADULT MULTIVITAMIN W/MINERALS CH
1.0000 | ORAL_TABLET | Freq: Every day | ORAL | Status: DC
  Administered 2023-01-13 – 2023-01-25 (×12): 1 via ORAL
  Filled 2023-01-13 (×12): qty 1

## 2023-01-13 MED ORDER — VANCOMYCIN VARIABLE DOSE PER UNSTABLE RENAL FUNCTION (PHARMACIST DOSING): Status: DC

## 2023-01-13 MED ORDER — INSULIN ASPART 100 UNIT/ML IJ SOLN
0.0000 [IU] | Freq: Three times a day (TID) | INTRAMUSCULAR | Status: DC
  Administered 2023-01-14: 5 [IU] via SUBCUTANEOUS
  Administered 2023-01-14: 3 [IU] via SUBCUTANEOUS
  Filled 2023-01-13 (×2): qty 1

## 2023-01-13 MED ORDER — CHLORHEXIDINE GLUCONATE CLOTH 2 % EX PADS
6.0000 | MEDICATED_PAD | Freq: Every day | CUTANEOUS | Status: DC
  Administered 2023-01-14 – 2023-01-25 (×13): 6 via TOPICAL

## 2023-01-13 MED ORDER — INSULIN PUMP
Freq: Three times a day (TID) | SUBCUTANEOUS | Status: DC
  Administered 2023-01-13 (×2): 8 via SUBCUTANEOUS
  Filled 2023-01-13: qty 1

## 2023-01-13 MED ORDER — MIDODRINE HCL 5 MG PO TABS
10.0000 mg | ORAL_TABLET | Freq: Once | ORAL | Status: AC
  Administered 2023-01-13: 10 mg via ORAL
  Filled 2023-01-13: qty 2

## 2023-01-13 MED ORDER — VANCOMYCIN HCL 1500 MG/300ML IV SOLN
1500.0000 mg | Freq: Once | INTRAVENOUS | Status: AC
  Administered 2023-01-13: 1500 mg via INTRAVENOUS
  Filled 2023-01-13: qty 300

## 2023-01-13 NOTE — Progress Notes (Signed)
Progress Note   Patient: Gary [name] [id number] DOB: 11-Nov-1945 DOA: 01/12/2023     1 DOS: the patient was seen and examined on 01/13/2023   Brief hospital course: [name] is a 77 year old male with history of recurrent stage III mantle cell lymphoma who has completed 43 cycles of Rituxan and Treanda, depression, insulin-dependent diabetes mellitus, hypertension, hyperlipidemia, who presents to the emergency department for chief concerns of generalized weakness and hypotension.  Patient is reported to have nausea and vomiting and diarrhea and this has been ongoing for the last several months  Vitals in the ED showed temperature of 98, respiration rate of 16, heart rate of 108, blood pressure 88/60, SpO2 of 99% on room air.  Serum sodium is 134, potassium 3.5, chloride 101, bicarb 19, BUN of 37, serum creatinine of 2.46, EGFR of 27, nonfasting blood glucose 70, WBC 3.3, hemoglobin 11.3, platelets of 92.  High sensitive troponin is 27.  Lactic acid is 2.1.  Blood cultures x 2 are in process.  ED treatment: Ceftriaxone 2 g IV one-time dose, lactated ringer 1 L bolus, vancomycin.  4/3:Labs with slight worsening of metabolic acidosis, creatinine with mild improvement to 2.43, worsening leukopenia and thrombocytopenia but all cell lines decreased-some dilutional effect.  On imaging which include CT head, cervical spine, CT abdominal and pelvis were negative for any acute abnormality.  INR at 3.5-discontinue DVT prophylaxis with heparin-SCDs ordered. Patient again becoming hypotensive-give him 1 more liter of bolus and also starting midodrine. Still no obvious source of infection as blood cultures remain negative in 24 hours. Will continue broad-spectrum antibiotics.  Concern of lower extremity cellulitis.  Assessment and Plan: * SIRS (systemic inflammatory response syndrome) Patient has elevated lactic acid of 2.1, leukopenia, elevated heart rate, low normal tensive, with possible source  of infection, cellulitis of the right leg vs CAP Blood cultures negative in 24 hours, procalcitonin elevated.  Lactic acidosis resolved. Will monitor second lactic acid Continue ceftriaxone and vancomycin Continue Zithromax  Elevated INR INR elevated at 3.5.  Patient was not on Coumadin.  Was given heparin subcu for DVT prophylaxis.  Hepatic function with mildly low total protein and albumin and rest normal.  Will check peripheral smear, D-dimer, aptt and fibrinogen. Also check vitamin K levels.  High risk for DIC -Discontinue subcu heparin -Use SCDs as DVT prophylaxis  AKI (acute kidney injury) On CKD 3B Baseline serum creatinine is 1.84-2.04/eGFR 34-38 Renal functions seems stable, did received IV fluid including bolus and infusion. -Monitor renal function -Avoid nephrotoxins  Essential hypertension, benign Blood pressure currently soft requiring fluid resuscitation. Started on midodrine  Hypothyroid Home levothyroxine 25 mcg daily before breakfast resumed  Adjustment disorder with depressed mood Fluoxetine 20 mg daily resumed  Hypercholesterolemia Pravastatin 40 mg daily resumed  Mantle cell lymphoma Last chemo was in January 2024.  Being followed up by oncology as outpatient.        Subjective: Patient was not feeling well overall.  Appetite remained poor and having some nausea.  Physical Exam: Vitals:   01/13/23 0201 01/13/23 0407 01/13/23 0809 01/13/23 1215  BP: (!) 93/54 112/82 (!) 101/52 (!) 84/34  Pulse:  85 97 62  Resp: 20 20 20 16   Temp: 97.8 F (36.6 C)  97.7 F (36.5 C) 98 F (36.7 C)  TempSrc: Oral  Oral   SpO2: 100% 100% 100% 98%  Weight:      Height:       General.  Chronically ill-appearing elderly man, in no acute  distress. Pulmonary.  Lungs clear bilaterally, normal respiratory effort. CV.  Regular rate and rhythm, no JVD, rub or murmur. Abdomen.  Soft, nontender, nondistended, BS positive. CNS.  Alert and oriented .  No focal neurologic  deficit. Extremities.  Bilateral lower extremity lymphedema, signs of chronic venous congestion, ruptured blisters with erythema and hyperthermia more on the right extending up to thighs. Psychiatry.  Judgment and insight appears normal.   Data Reviewed: Prior data reviewed.  Family Communication: Called Sister listed in his chart with no response.  Disposition: Status is: Inpatient Remains inpatient appropriate because: Severity of illness  Planned Discharge Destination: Home  DVT prophylaxis.  SCDs Time spent: 50 minutes  Author: Lorella Nimrod, MD 01/13/2023 1:24 PM  For on call review [email or web address].

## 2023-01-13 NOTE — Inpatient Diabetes Management (Signed)
Inpatient Diabetes Program Recommendations  AACE/ADA: New Consensus Statement on Inpatient Glycemic Control (2015)  Target Ranges:  Prepandial:   less than 140 mg/dL      Peak postprandial:   less than 180 mg/dL (1-2 hours)      Critically ill patients:  140 - 180 mg/dL   Lab Results  Component Value Date   GLUCAP 261 (H) 01/13/2023   HGBA1C 6.4 12/02/2022    Review of Glycemic Control  Latest Reference Range & Units 01/12/23 21:45 01/12/23 23:59 01/13/23 04:06 01/13/23 08:07  Glucose-Capillary 70 - 99 mg/dL 149 (H) 199 (H) 207 (H) 261 (H)  (H): Data is abnormally high  Diabetes history: DM1 Outpatient Diabetes medications:  Metronic Insulin Pump  Basal rates  Midnight = 0.675  6 AM = 0.8  12 PM = 0.75  9 PM = 0.775  TDD basal: 17.925 units   Bolus settings  I/C: 9  ISF: 60  Target Glucose: 100-130  Active insulin time: 3 hours   Current orders for Inpatient glycemic control: Insulin Pump  Spoke with patient at bedside.  He is current with endocrinologist, Dr. Honor Junes.  He is currently wearing his insulin pump.  He last changed his site "a few days ago".   He does not have any insulin pump supplies with him.  He states his sister may be able to bring some insulin and supplies.  Reminded him he should change his site every 3-4 days.  If he is not able to get supplies for his pump he will need to remove his pump and start basal bolus while inpatient.  He verbalizes understanding.  If he comes off pump, please consider:  Semglee 14 units QD Novolog 0-9 units TID and 0-5 units QHS Novolog 4 units TID with meals if he consumes at least 50%  Will continue to follow while inpatient.  Thank you, Reche Dixon, MSN, [employer] Diabetes Coordinator Inpatient Diabetes Program [phone] (team pager from 8a-5p)

## 2023-01-13 NOTE — Progress Notes (Signed)
01/12/2023 2315 Received pt to room 254 from ED.  Pt is A&Ox4, no C/O voiced.  Tele monitor applied and CCMD notified.  Oriented to room, call light and bed.  Call bell in reach, bed alarm on. [place]

## 2023-01-13 NOTE — Progress Notes (Signed)
Initial Nutrition Assessment  DOCUMENTATION CODES:   Not applicable  INTERVENTION:   -NSA MIghty Shake TID, each supplement provides 200 kcals and 7 grams protein -Magic cup TID with meals, each supplement provides 290 kcal and 9 grams of protein  -MVI with minerals daily -Liberalize diet to regular for wider variety of meal selections -Plan for SLP eval  NUTRITION DIAGNOSIS:   Inadequate oral intake related to poor appetite as evidenced by percent weight loss, meal completion < 25%.  GOAL:   Patient will meet greater than or equal to 90% of their needs  MONITOR:   PO intake, Supplement acceptance  REASON FOR ASSESSMENT:   Malnutrition Screening Tool    ASSESSMENT:   Pt with history of recurrent stage III mantle cell lymphoma who has completed 43 cycles of Rituxan and Treanda, depression, insulin-dependent diabetes mellitus, hypertension, hyperlipidemia, who presents for chief concerns of generalized weakness and hypotension.  Pt admitted with SIRS and AKI.    Reviewed I/O's: +1.1 L x 24 hours  UOP: 250 ml x 24 hours  Spoke with pt at bedside, who reports feeling poorly today. He shares that he has had poor oral intake over the past 1.5 months. Hs shares he will feel hungry, but will not desire food once it is put in front of him. When he tries to eat, he reports he will get episodes of regurgitation and food will "come right back up". Pt has not noticed that certain foods aggravate it; states this happens with all food. Pt reports he drank one cup of orange juice and sipped on a second cup, which "came right back up". Noted meal completions 0-30%. Pt has access to dentures, which he uses while eating. He drinks [employer] Breakfast at home as he does not tolerate Ensure supplements well.   Pt shares that his UBW is around 230#. He estimates he has lost about 15# over the past 6 weeks. Reviewed wt hx; pt has experienced a 16.3% wt loss over the past 3 months, which  is significant for time frame. Pt also with edema, which may be masking true weight loss as well as fat and muscle depletions.  Discussed importance of good meal and supplement intake to promote healing. RD will also liberalize diet to regular for wider variety of meal selections.   Case discussed with MD and SLP. Plan for SLP evaluation to rule out mechanical causes of regurgitation.   Medications reviewed and include ferrous sulfate.   Lab Results  Component Value Date   HGBA1C 6.4 12/02/2022   PTA DM medications are insulin pump. Per DM coordinator note, pt family may bring in pump supplies for him to use. If unable to, he is agreeable to subcutaneous insulin regimen.    Labs reviewed: Na: 132, CBGS: 140-174 (inpatient orders for glycemic control are insulin pump).    NUTRITION - FOCUSED PHYSICAL EXAM:  Flowsheet Row Most Recent Value  Orbital Region No depletion  Upper Arm Region No depletion  Thoracic and Lumbar Region No depletion  Buccal Region No depletion  Temple Region No depletion  Clavicle Bone Region No depletion  Clavicle and Acromion Bone Region No depletion  Scapular Bone Region No depletion  Dorsal Hand No depletion  Patellar Region No depletion  Anterior Thigh Region No depletion  Posterior Calf Region No depletion  Edema (RD Assessment) Moderate  Hair Reviewed  Eyes Reviewed  Mouth Reviewed  Skin Reviewed  Nails Reviewed       Diet Order:  Diet Order             Diet regular Room service appropriate? Yes; Fluid consistency: Thin  Diet effective now                   EDUCATION NEEDS:   Education needs have been addressed  Skin:  Skin Assessment: Skin Integrity Issues: Skin Integrity Issues:: Stage II Stage II: rt buttocks  Last BM:  01/12/23  Height:   Ht Readings from Last 1 Encounters:  01/12/23 5\' 10"  (1.778 m)    Weight:   Wt Readings from Last 1 Encounters:  01/12/23 93 kg    Ideal Body Weight:  75.5 kg  BMI:  Body  mass index is 29.42 kg/m.  Estimated Nutritional Needs:   Kcal:  2050-2250  Protein:  115-130 grams  Fluid:  > 2 L    Gary [name], RD, LDN, [place] Registered Dietitian II Certified Diabetes Care and Education Specialist Please refer to [facility] for RD and/or RD on-call/weekend/after hours pager

## 2023-01-13 NOTE — Assessment & Plan Note (Signed)
Last chemo was in January 2024.  Being followed up by oncology as outpatient.

## 2023-01-13 NOTE — Assessment & Plan Note (Addendum)
INR elevated at 3.5.  Patient was not on Coumadin.  Was given heparin subcu for DVT prophylaxis.  Hepatic function with mildly low total protein and albumin and rest normal.  Will check peripheral smear, D-dimer, aptt and fibrinogen. Also check vitamin K levels.  High risk for DIC -Discontinue subcu heparin -Use SCDs as DVT prophylaxis

## 2023-01-13 NOTE — Progress Notes (Signed)
Pharmacy Antibiotic Note  Gary [name] is a 77 y.o. male with history of recurrent stage III mantle cell lymphoma who has completed 43 cycles of Rituxan and Treanda, depression, insulin-dependent diabetes mellitus, hypertension, hyperlipidemia admitted on 01/12/2023 with cellulitis.  Pharmacy has been consulted for vancomycin dosing. Serum creatinine is elevated but appears to have stabilized.   Following a 1750 mg IV dose 04/02 1837 al level was drawn: vancomycin level approximately 24 hours later equal 14 mcg/mL   Plan: vancomycin 1500 mg IV x 1    ---Will continue to use variable dosing until creatine back to baseline ---CMP in am   Height: 5\' 10"  (177.8 cm) Weight: 93 kg (205 lb 0.4 oz) IBW/kg (Calculated) : 73  Temp (24hrs), Avg:98.1 F (36.7 C), Min:97.7 F (36.5 C), Max:98.4 F (36.9 C)  Recent Labs  Lab 01/07/23 1020 01/08/23 1311 01/11/23 1345 01/12/23 1459 01/12/23 1546 01/12/23 1828 01/13/23 0422  WBC 3.1*  --   --  3.3*  --   --  2.3*  CREATININE 1.88* 1.84* 2.04* 2.46*  --   --  2.43*  LATICACIDVEN  --   --   --   --  2.1* 1.5  --      Estimated Creatinine Clearance: 30.6 mL/min (A) (by C-G formula based on SCr of 2.43 mg/dL (H)).    Allergies  Allergen Reactions   Rofecoxib Nausea Only    Antimicrobials this admission: azithromycin 4/2 >> vancomycin 4/2 >>  ceftriaxone 4/2 >>   Microbiology results: 4/2 BCx: NGTD 4/3 MRSA PCR: negative  Thank you for allowing pharmacy to be a part of this patient's care.  Dallie Piles 01/13/2023 3:33 PM

## 2023-01-14 DIAGNOSIS — R791 Abnormal coagulation profile: Secondary | ICD-10-CM | POA: Diagnosis not present

## 2023-01-14 DIAGNOSIS — R6510 Systemic inflammatory response syndrome (SIRS) of non-infectious origin without acute organ dysfunction: Secondary | ICD-10-CM | POA: Diagnosis not present

## 2023-01-14 DIAGNOSIS — I10 Essential (primary) hypertension: Secondary | ICD-10-CM | POA: Diagnosis not present

## 2023-01-14 DIAGNOSIS — E8729 Other acidosis: Secondary | ICD-10-CM

## 2023-01-14 DIAGNOSIS — N179 Acute kidney failure, unspecified: Secondary | ICD-10-CM | POA: Diagnosis not present

## 2023-01-14 LAB — BASIC METABOLIC PANEL
Anion gap: 17 — ABNORMAL HIGH (ref 5–15)
Anion gap: 12 (ref 5–15)
BUN: 51 mg/dL — ABNORMAL HIGH (ref 8–23)
BUN: 50 mg/dL — ABNORMAL HIGH (ref 8–23)
CO2: 16 mmol/L — ABNORMAL LOW (ref 22–32)
CO2: 19 mmol/L — ABNORMAL LOW (ref 22–32)
Calcium: 7.3 mg/dL — ABNORMAL LOW (ref 8.9–10.3)
Calcium: 7.2 mg/dL — ABNORMAL LOW (ref 8.9–10.3)
Chloride: 98 mmol/L (ref 98–111)
Chloride: 99 mmol/L (ref 98–111)
Creatinine, Ser: 2.38 mg/dL — ABNORMAL HIGH (ref 0.61–1.24)
Creatinine, Ser: 2.19 mg/dL — ABNORMAL HIGH (ref 0.61–1.24)
GFR, Estimated: 28 mL/min — ABNORMAL LOW (ref >60)
GFR, Estimated: 31 mL/min — ABNORMAL LOW (ref >60)
Glucose, Bld: 318 mg/dL — ABNORMAL HIGH (ref 70–99)
Glucose, Bld: 245 mg/dL — ABNORMAL HIGH (ref 70–99)
Potassium: 3.5 mmol/L (ref 3.5–5.1)
Potassium: 3.1 mmol/L — ABNORMAL LOW (ref 3.5–5.1)
Sodium: 131 mmol/L — ABNORMAL LOW (ref 135–145)
Sodium: 130 mmol/L — ABNORMAL LOW (ref 135–145)

## 2023-01-14 LAB — COMPREHENSIVE METABOLIC PANEL
ALT: 8 U/L (ref 0–44)
AST: 17 U/L (ref 15–41)
Albumin: 2.6 g/dL — ABNORMAL LOW (ref 3.5–5.0)
Alkaline Phosphatase: 77 U/L (ref 38–126)
Anion gap: 18 — ABNORMAL HIGH (ref 5–15)
BUN: 50 mg/dL — ABNORMAL HIGH (ref 8–23)
CO2: 14 mmol/L — ABNORMAL LOW (ref 22–32)
Calcium: 7.5 mg/dL — ABNORMAL LOW (ref 8.9–10.3)
Chloride: 100 mmol/L (ref 98–111)
Creatinine, Ser: 2.33 mg/dL — ABNORMAL HIGH (ref 0.61–1.24)
GFR, Estimated: 29 mL/min — ABNORMAL LOW (ref >60)
Glucose, Bld: 249 mg/dL — ABNORMAL HIGH (ref 70–99)
Potassium: 3.9 mmol/L (ref 3.5–5.1)
Sodium: 132 mmol/L — ABNORMAL LOW (ref 135–145)
Total Bilirubin: 1.4 mg/dL — ABNORMAL HIGH (ref 0.3–1.2)
Total Protein: 5.1 g/dL — ABNORMAL LOW (ref 6.5–8.1)

## 2023-01-14 LAB — CBC WITH DIFFERENTIAL/PLATELET
Abs Immature Granulocytes: 0.2 10*3/uL — ABNORMAL HIGH (ref 0.00–0.07)
Basophils Absolute: 0 10*3/uL (ref 0.0–0.1)
Basophils Relative: 1 %
Eosinophils Absolute: 0 10*3/uL (ref 0.0–0.5)
Eosinophils Relative: 1 %
HCT: 31 % — ABNORMAL LOW (ref 39.0–52.0)
Hemoglobin: 10 g/dL — ABNORMAL LOW (ref 13.0–17.0)
Immature Granulocytes: 7 %
Lymphocytes Relative: 25 %
Lymphs Abs: 0.7 10*3/uL (ref 0.7–4.0)
MCH: 29.8 pg (ref 26.0–34.0)
MCHC: 32.3 g/dL (ref 30.0–36.0)
MCV: 92.3 fL (ref 80.0–100.0)
Monocytes Absolute: 0.4 10*3/uL (ref 0.1–1.0)
Monocytes Relative: 14 %
Neutro Abs: 1.5 10*3/uL — ABNORMAL LOW (ref 1.7–7.7)
Neutrophils Relative %: 52 %
Platelets: 80 10*3/uL — ABNORMAL LOW (ref 150–400)
RBC: 3.36 MIL/uL — ABNORMAL LOW (ref 4.22–5.81)
RDW: 14.9 % (ref 11.5–15.5)
Smear Review: NORMAL
WBC Morphology: INCREASED
WBC: 2.9 10*3/uL — ABNORMAL LOW (ref 4.0–10.5)
nRBC: 1.4 % — ABNORMAL HIGH (ref 0.0–0.2)

## 2023-01-14 LAB — BLOOD GAS, VENOUS
Acid-base deficit: 4 mmol/L — ABNORMAL HIGH (ref 0.0–2.0)
Bicarbonate: 20.9 mmol/L (ref 20.0–28.0)
O2 Saturation: 88 %
Patient temperature: 37
pCO2, Ven: 37 mmHg — ABNORMAL LOW (ref 44–60)
pH, Ven: 7.36 (ref 7.25–7.43)
pO2, Ven: 58 mmHg — ABNORMAL HIGH (ref 32–45)

## 2023-01-14 LAB — GLUCOSE, CAPILLARY
Glucose-Capillary: 231 mg/dL — ABNORMAL HIGH (ref 70–99)
Glucose-Capillary: 243 mg/dL — ABNORMAL HIGH (ref 70–99)
Glucose-Capillary: 277 mg/dL — ABNORMAL HIGH (ref 70–99)
Glucose-Capillary: 286 mg/dL — ABNORMAL HIGH (ref 70–99)
Glucose-Capillary: 205 mg/dL — ABNORMAL HIGH (ref 70–99)
Glucose-Capillary: 195 mg/dL — ABNORMAL HIGH (ref 70–99)
Glucose-Capillary: 153 mg/dL — ABNORMAL HIGH (ref 70–99)

## 2023-01-14 LAB — BETA-HYDROXYBUTYRIC ACID
Beta-Hydroxybutyric Acid: 5.42 mmol/L — ABNORMAL HIGH (ref 0.05–0.27)
Beta-Hydroxybutyric Acid: 1.44 mmol/L — ABNORMAL HIGH (ref 0.05–0.27)

## 2023-01-14 LAB — PROTIME-INR
INR: 3.1 — ABNORMAL HIGH (ref 0.8–1.2)
Prothrombin Time: 31.8 seconds — ABNORMAL HIGH (ref 11.4–15.2)

## 2023-01-14 LAB — LACTIC ACID, PLASMA: Lactic Acid, Venous: 1.2 mmol/L (ref 0.5–1.9)

## 2023-01-14 LAB — PHOSPHORUS: Phosphorus: 3.2 mg/dL (ref 2.5–4.6)

## 2023-01-14 LAB — APTT: aPTT: 69 seconds — ABNORMAL HIGH (ref 24–36)

## 2023-01-14 LAB — MAGNESIUM: Magnesium: 1.7 mg/dL (ref 1.7–2.4)

## 2023-01-14 MED ORDER — DEXTROSE 50 % IV SOLN: 0.0000 mL | INTRAVENOUS | Status: DC | PRN

## 2023-01-14 MED ORDER — LACTATED RINGERS IV SOLN: INTRAVENOUS | Status: DC

## 2023-01-14 MED ORDER — PANTOPRAZOLE SODIUM 40 MG IV SOLR
40.0000 mg | Freq: Two times a day (BID) | INTRAVENOUS | Status: DC
  Administered 2023-01-14 – 2023-01-16 (×4): 40 mg via INTRAVENOUS
  Filled 2023-01-14 (×4): qty 10

## 2023-01-14 MED ORDER — INSULIN REGULAR(HUMAN) IN NACL 100-0.9 UT/100ML-% IV SOLN
INTRAVENOUS | Status: DC
  Filled 2023-01-14: qty 100

## 2023-01-14 MED ORDER — INSULIN ASPART 100 UNIT/ML IJ SOLN: 3.0000 [IU] | Freq: Three times a day (TID) | INTRAMUSCULAR | Status: DC

## 2023-01-14 MED ORDER — POTASSIUM CHLORIDE 20 MEQ PO PACK
40.0000 meq | PACK | Freq: Once | ORAL | Status: AC
  Administered 2023-01-14: 40 meq via ORAL
  Filled 2023-01-14: qty 2

## 2023-01-14 MED ORDER — DEXTROSE IN LACTATED RINGERS 5 % IV SOLN: INTRAVENOUS | Status: DC

## 2023-01-14 MED ORDER — POTASSIUM CHLORIDE 10 MEQ/100ML IV SOLN
10.0000 meq | INTRAVENOUS | Status: DC
Start: 2023-01-14 — End: 2023-01-14

## 2023-01-14 MED ORDER — MIDODRINE HCL 5 MG PO TABS
10.0000 mg | ORAL_TABLET | Freq: Three times a day (TID) | ORAL | Status: DC
  Administered 2023-01-14 – 2023-01-25 (×33): 10 mg via ORAL
  Filled 2023-01-14 (×32): qty 2

## 2023-01-14 MED ORDER — SODIUM CHLORIDE 0.9 % IV SOLN: INTRAVENOUS | Status: DC

## 2023-01-14 MED ORDER — LACTATED RINGERS IV BOLUS
20.0000 mL/kg | Freq: Once | INTRAVENOUS | Status: AC
  Administered 2023-01-14: 1000 mL via INTRAVENOUS

## 2023-01-14 MED ORDER — INSULIN GLARGINE-YFGN 100 UNIT/ML ~~LOC~~ SOLN
10.0000 [IU] | Freq: Two times a day (BID) | SUBCUTANEOUS | Status: DC
  Administered 2023-01-14: 10 [IU] via SUBCUTANEOUS
  Filled 2023-01-14 (×2): qty 0.1

## 2023-01-14 MED ORDER — SODIUM CHLORIDE 0.9 % IV BOLUS
500.0000 mL | Freq: Once | INTRAVENOUS | Status: AC
  Administered 2023-01-14: 500 mL via INTRAVENOUS

## 2023-01-14 MED ORDER — LINEZOLID 600 MG/300ML IV SOLN
600.0000 mg | Freq: Two times a day (BID) | INTRAVENOUS | Status: DC
  Administered 2023-01-14 – 2023-01-17 (×8): 600 mg via INTRAVENOUS
  Filled 2023-01-14 (×10): qty 300

## 2023-01-14 MED ORDER — ORAL CARE MOUTH RINSE: 15.0000 mL | OROMUCOSAL | Status: DC | PRN

## 2023-01-14 MED ORDER — POTASSIUM CHLORIDE 10 MEQ/100ML IV SOLN
10.0000 meq | INTRAVENOUS | Status: AC
  Administered 2023-01-14 – 2023-01-15 (×4): 10 meq via INTRAVENOUS
  Filled 2023-01-14 (×4): qty 100

## 2023-01-14 MED ORDER — VITAMIN K1 10 MG/ML IJ SOLN
10.0000 mg | Freq: Once | INTRAVENOUS | Status: DC
  Filled 2023-01-14: qty 1

## 2023-01-14 MED ORDER — INSULIN ASPART 100 UNIT/ML IJ SOLN
0.0000 [IU] | INTRAMUSCULAR | Status: DC
Start: 2023-01-15 — End: 2023-01-14

## 2023-01-14 MED ORDER — INSULIN ASPART 100 UNIT/ML IJ SOLN: 0.0000 [IU] | Freq: Three times a day (TID) | INTRAMUSCULAR | Status: DC

## 2023-01-14 MED ORDER — INSULIN ASPART 100 UNIT/ML IJ SOLN
0.0000 [IU] | INTRAMUSCULAR | Status: DC
  Administered 2023-01-14: 7 [IU] via SUBCUTANEOUS
  Administered 2023-01-15: 4 [IU] via SUBCUTANEOUS
  Administered 2023-01-16: 7 [IU] via SUBCUTANEOUS
  Administered 2023-01-16: 4 [IU] via SUBCUTANEOUS
  Filled 2023-01-14 (×4): qty 1

## 2023-01-14 MED ORDER — STERILE WATER FOR INJECTION IV SOLN
INTRAVENOUS | Status: DC
  Filled 2023-01-14 (×2): qty 1000
  Filled 2023-01-14: qty 150

## 2023-01-14 NOTE — Consult Note (Addendum)
GI Inpatient Consult Note  Reason for Consult: Dysphagia, vomiting   Attending Requesting Consult: Dr. Lorella Nimrod  History of Present Illness: KAUA EARNST is a 77 y.o. male seen for evaluation of dysphagia, vomiting at the request of Dr. Reesa Chew. Patient has a PMH of recurrent stage III mantle cell lymphoma who completed 43 cycles of Rituxan and Treanda, ID DM, hypertension, hyperlipidemia, depression.  Patient presented to the [facility] ED 2 days ago for chief complaint of generalized weakness and hypotension. Upon presentation to the ED, vital signs were 97.6-119-18-80 8/59.  SpO2 98%. Labs were significant for serum sodium is 134, potassium 3.5, chloride 101, bicarb 19, BUN of 37, serum creatinine of 2.46, EGFR of 27, nonfasting blood glucose 70, WBC 3.3, hemoglobin 11.3, platelets of 92. High sensitive troponin is 27.  Lactic acid is 2.1.  Blood cultures x 2 are in process. Imaging studies revealed CT A/P no acute abdominal or pelvic pathology.  CXR 2 view: Bibasilar atelectasis.  No new focal airspace opacity. CT head: No acute findings.    In the ED he was treated with ceftriaxone 2 g IV one-time dose, lactated Ringer's bolus, vancomycin.  Getting worked up for SIRS.  Blood cultures x 2 in process. Azithromycin has been added.  Chronic kidney disease noted, baseline serum creatinine 1.84-2.04 estimated GFR 34-38.  He has reported poor oral intake with concurrent GI loss.  He was interviewed alone today and is a vague historian. Chart reviewed. He reports episodes of vomiting and diarrhea over the last 1-1/2 to 2 months with a 30 lb weight loss. No heartburn since he had his gallbladder removed. No NSAID use except ASA. Reports he cannot eat d/t pills and food are hanging up in his throat when he tries to swallow.  He will start to get food down, but within minutes, it rolls back up and vomits.  This has occurred during this admission and nursing staff report green emesis. No hematemeses. No melena.  He has no nausea sensation or queasiness in the stomach.  No abdominal pain.  He denies any sore throat or odynophagia. No hx of oral thrush. He did have antibiotics several months ago.  He did complete chemotherapy in January 2024 and suspected the symptoms were following chemotherapy side effects but has not improved.  He has also had problems with diarrhea.  He reports diarrhea was several times a day.  Unable to quantify. No abdominal pain, hematochezia. He reports the diarrhea has improved to once a day over the last few weeks.  He has had no bowel movement during this admission.    Tobacco negative smoking, but he does chewing tobacco.  Normally drinks 2 beers at night to help him sleep.  He has not been able to consume alcohol since he has not been feeling well.  Last Colonoscopy: 01/08/2014: Indication: Personal history of colon polyps impression: 1 diminutive tubular adenomatous polyp, nodules ileocecal valve, internal hemorrhoids.Ileocecal valve with prominent lymphoid aggregate.  Negative dysplasia or malignancy.  Last Endoscopy: 06/10/2014: Indication: Heartburn: Normal esophagus, erythematous mucosa in the gastric body.  Normal examined duodenum.  Pathology: Moderate chronic gastritis with severe H atrophy and intestinal metaplasia.  Negative H. pylori.    Past Medical History:  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    Gastroparesis    Graves disease    Hypercholesterolemia  Hypertension    Mantle cell lymphoma 07/2014   Peripheral neuropathy    Shingles     Problem List: Patient Active Problem List   Diagnosis Date Noted   Increased anion gap metabolic acidosis [id number]   Elevated INR 01/13/2023   Pressure injury of skin 01/13/2023   Orthostatic hypotension 01/12/2023   Sepsis due to cellulitis 01/12/2023   AKI (acute kidney  injury) 01/12/2023   Depression, major, single episode, mild 11/15/2022   Recurrent falls 08/29/2021   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    Left rib fracture_7th rib    Leukocytosis    HTN (hypertension)    Hypothyroid    CAD (coronary artery disease)    Porokeratosis 05/15/2021   Aortic atherosclerosis 05/05/2021   Emphysema lung 05/05/2021   Thrombocytopenia 04/29/2021   Splinter of foot without infection 02/23/2021   Arm laceration 02/09/2021   Hypokalemia 10/24/2020   Injury by nail 08/15/2020   Shingles 07/24/2020   Rash 07/02/2020   Colon cancer screening 07/02/2020   Sick sinus syndrome 12/28/2019   Acquired absence of kidney 09/13/2019   Edema of lower extremity 09/13/2019   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, or unspecified 09/13/2019   Proteinuria 09/13/2019   Lower extremity pain, bilateral    Cellulitis 08/18/2019   Left hip pain 07/16/2019   Pain due to onychomycosis of toenails of both feet 04/03/2019   Swelling of limb 02/07/2019   CKD (chronic kidney disease) stage 3, GFR 30-59 ml/min 09/16/2018   Left shoulder pain 09/16/2018   Chronic cholecystitis 12/29/2017   Graves disease 12/29/2017   Peripheral neuropathy 12/29/2017   S/p nephrectomy 12/29/2017   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   Symptomatic bradycardia 12/02/2017   CKD (chronic kidney disease), stage IIIa 0000000   Chronic systolic CHF (congestive heart failure) 12/02/2017   Saturday night paralysis 11/12/2017   Hypoglycemia 10/28/2017   Goals of care, counseling/discussion 10/24/2017   Lymphedema 10/20/2017   Iron deficiency anemia 09/11/2017   Fall 08/13/2017   Humerus fracture 08/13/2017   Anemia 01/12/2017   Bilateral carotid artery stenosis 06/29/2016   Hand laceration 12/16/2015   Adjustment disorder with depressed mood 08/11/2015   Cardiomyopathy, idiopathic 04/22/2015   CAD in native artery 11/02/2014    Mantle cell lymphoma 08/03/2014   History of colonic polyps 04/29/2014   Irregular heart beat 04/22/2014   SOB (shortness of breath) 04/22/2014   Stress 03/21/2014   PVC (premature ventricular contraction) 02/21/2014   GERD (gastroesophageal reflux disease) 10/23/2013   Gastroparesis 06/25/2013   Chest pain 04/13/2013   Thyroid disease 04/13/2013   Diabetes 04/13/2013   Essential hypertension, benign 04/13/2013   Hypercholesterolemia 04/13/2013   Constipation, unspecified 10/13/1999   Depression, unspecified 10/13/1999   Dry eye syndrome of bilateral lacrimal glands 10/13/1999   Chronic venous insufficiency 10/13/1999   Athscl heart disease of native coronary artery w/o ang pctrs 10/13/1999    Past Surgical History: Past Surgical History:  Procedure Laterality Date   CHOLECYSTECTOMY     NEPHRECTOMY     right   PACEMAKER INSERTION N/A 12/07/2017   Procedure: INSERTION PACEMAKER DUAL CHAMBER INITIAL INSERT;  Surgeon: Isaias Cowman, MD;  Location: [facility] OR[facility];  Service: Cardiovascular;  Laterality: N/A;   PORTA CATH INSERTION N/A 11/03/2017   Procedure: PORTA CATH INSERTION;  Surgeon: Katha Cabal, MD;  Location: [place] [facility];  Service: Cardiovascular;  Laterality: N/A;    Allergies: Allergies  Allergen Reactions  Rofecoxib Nausea Only    Home Medications: Medications Prior to Admission  Medication Sig Dispense Refill Last Dose   Cholecalciferol (VITAMIN D3) 50 MCG (2000 UT) CAPS Take 2,000 Units by mouth daily.   01/12/2023   ferrous sulfate 325 (65 FE) MG tablet Take 325 mg by mouth daily with breakfast.   01/12/2023   FLUoxetine (PROZAC) 20 MG tablet Take 1 tablet (20 mg total) by mouth daily. 30 tablet 2 01/11/2023   furosemide (LASIX) 40 MG tablet Take 2 tablets in the morning, and 1 tablet in the evening. 90 tablet 6 01/12/2023   glucose 4 GM chewable tablet Chew 1 tablet (4 g total) by mouth once as needed for low blood sugar. 50 tablet 0 prn at unk   Insulin  Human (INSULIN PUMP) SOLN Inject into the skin.   01/12/2023   levothyroxine (SYNTHROID) 25 MCG tablet Take 1 tablet (25 mcg total) by mouth daily before breakfast. 30 tablet 1 01/12/2023   lidocaine-prilocaine (EMLA) cream Apply 1 Application topically once.   prn at unk   nystatin cream (MYCOSTATIN) APPLY TO AFFECTED AREA TWICE A DAY 30 g 1 prn at unk   potassium chloride (KLOR-CON) 10 MEQ tablet TAKE 1 TABLET BY MOUTH TWICE A DAY 180 tablet 2 01/12/2023   potassium chloride SA (KLOR-CON M) 20 MEQ tablet Take 1 tablet (20 mEq total) by mouth 2 (two) times daily. 7 tablet 0 01/12/2023   pravastatin (PRAVACHOL) 40 MG tablet TAKE 1 TABLET BY MOUTH EVERY DAY 90 tablet 1 01/12/2023   prochlorperazine (COMPAZINE) 10 MG tablet Take 10 mg by mouth every 6 (six) hours as needed.   prn at unk   vitamin B-12 (CYANOCOBALAMIN) 1000 MCG tablet Take 1,000 mcg by mouth daily.   Past Week   feeding supplement, GLUCERNA SHAKE, (GLUCERNA SHAKE) LIQD Take 237 mLs by mouth 2 (two) times daily between meals. 237 mL 0    insulin aspart (NOVOLOG) 100 UNIT/ML injection Inject 6 Units into the skin 3 (three) times daily with meals. (Patient not taking: Reported on 01/12/2023)   Not Taking   Home medication reconciliation was completed with the patient.   Scheduled Inpatient Medications:    Chlorhexidine Gluconate Cloth  6 each Topical Daily   ferrous sulfate  325 mg Oral Q lunch   insulin glargine-yfgn  10 Units Subcutaneous BID   levothyroxine  25 mcg Oral QAC breakfast   midodrine  10 mg Oral TID WC   multivitamin with minerals  1 tablet Oral Daily   pravastatin  40 mg Oral QHS    Continuous Inpatient Infusions:    azithromycin Stopped (01/13/23 2225)   cefTRIAXone (ROCEPHIN)  IV 2 g (01/14/23 1116)   dextrose 5% lactated ringers     insulin     lactated ringers     lactated ringers     linezolid (ZYVOX) IV 600 mg (01/14/23 1611)   phytonadione (VITAMIN K) 10 mg in dextrose 5 % 50 mL IVPB     potassium chloride      sodium bicarbonate 150 mEq in sterile water 1,150 mL infusion 75 mL/hr at 01/14/23 1139    PRN Inpatient Medications:  acetaminophen **OR** acetaminophen, dextrose, hydrALAZINE, ondansetron **OR** ondansetron (ZOFRAN) IV, mouth rinse  Family History: family history includes Alcohol abuse in his maternal uncle; Breast cancer in his mother; Cancer in his father; Diabetes in his father and sister; Heart disease in an other family member.  The patient's family history is negative for inflammatory bowel disorders,  GI malignancy, or solid organ transplantation.  Social History:   reports that he has quit smoking. His smoking use included cigarettes. He has been exposed to tobacco smoke. His smokeless tobacco use includes chew. He reports current alcohol use of about 3.0 standard drinks of alcohol per week. He reports that he does not use drugs. The patient denies ETOH, tobacco, or drug use.   Review of Systems: Constitutional: See HPI. Wt loss, generalized weakness.   Eyes: No changes in vision. ENT: No oral lesions, sore throat.  GI: see HPI.  Heme/Lymph: No easy bruising.  CV: No chest pain.  GU: No hematuria.  Integumentary: No rashes.  Neuro: No headaches.  Psych: Positive  depression Endocrine: No heat/cold intolerance.  Allergic/Immunologic: No urticaria.  Resp: Pt reports SOB; No cough Musculoskeletal: Positive lower ext swelling and redness/cellulitis    Physical Examination: BP 91/66 (BP Location: Left Arm)   Pulse 75   Temp 97.9 F (36.6 C)   Resp 16   Ht 5\' 10"  (1.778 m)   Wt 93 kg   SpO2 94%   BMI 29.42 kg/m  Gen: NAD, alert and oriented x 4, Chromically ill in appearance HEENT:  EOMI Neck: supple, no JVD or thyromegaly Chest: CTA bilaterally, no wheezes, crackles, or other adventitious sounds CV: RRR, no m/g/c/r Abd: soft, NT, ND, +BS in all four quadrants; no HSM, guarding, rigidity, or rebound tenderness Ext/skin: Positive  edema, erythema , wrapped and  compression pneumatics in place  Data: Lab Results  Component Value Date   WBC 2.9 (L) 01/14/2023   HGB 10.0 (L) 01/14/2023   HCT 31.0 (L) 01/14/2023   MCV 92.3 01/14/2023   PLT 80 (L) 01/14/2023   Recent Labs  Lab 01/12/23 1459 01/13/23 0422 01/14/23 0536  HGB 11.3* 10.2* 10.0*   Lab Results  Component Value Date   NA 131 (L) 01/14/2023   K 3.5 01/14/2023   CL 98 01/14/2023   CO2 16 (L) 01/14/2023   BUN 51 (H) 01/14/2023   CREATININE 2.38 (H) 01/14/2023   Lab Results  Component Value Date   ALT 8 01/14/2023   AST 17 01/14/2023   ALKPHOS 77 01/14/2023   BILITOT 1.4 (H) 01/14/2023   Recent Labs  Lab 01/14/23 0536  APTT 69*  INR 3.1*   CLINICAL DATA:  Abdominal pain   EXAM: CT ABDOMEN AND PELVIS WITHOUT CONTRAST   TECHNIQUE: Multidetector CT imaging of the abdomen and pelvis was performed following the standard protocol without IV contrast.   RADIATION DOSE REDUCTION: This exam was performed according to the departmental dose-optimization program which includes automated exposure control, adjustment of the mA and/or kV according to patient size and/or use of iterative reconstruction technique.   COMPARISON:  08/07/2022   FINDINGS: Lower chest: Linear bibasilar subsegmental atelectasis or scarring. Enlarged heart and pacer wires noted. Small hiatal hernia   Hepatobiliary: No focal liver abnormality is seen. Status post cholecystectomy. No biliary dilatation.   Pancreas: Unremarkable. No pancreatic ductal dilatation or surrounding inflammatory changes.   Spleen: Normal in size without focal abnormality.   Adrenals/Urinary Tract: Right kidney is absent. No adrenal lesions. No nephrolithiasis or hydronephrosis. Unremarkable urinary bladder   Stomach/Bowel: Stomach is within normal limits. Appendix appears normal. No evidence of bowel wall thickening, distention, or inflammatory changes.   Vascular/Lymphatic: Atheromatous changes. Shotty  small retroperitoneal nodes are identified. The enlarged retroperitoneal, pelvic, and iliac nodes observed previously have nearly resolved. One left para-aortic node that previously measured 1.7 cm now measures  1.0 cm for example.   Reproductive: Prostate is unremarkable.   Other: No abdominal wall hernia or abnormality. No abdominopelvic ascites.   Musculoskeletal: Thoracolumbar degenerative changes. No acute osseous abnormalities.   IMPRESSION: 1. Retroperitoneal adenopathy present on prior study has nearly resolved. 2. No acute abdominal or pelvic pathology identified.     Electronically Signed   By: Sammie Bench M.D.   On: 01/12/2023 16:31  Assessment/Plan: Mr. Fritchie is a 77 y.o. male seen for evaluation of dysphagia, vomiting at the request of Dr. Reesa Chew. Patient has a PMH of recurrent stage III mantle cell lymphoma who completed 43 cycles of Rituxan and Treanda, ID DM, hypertension, hyperlipidemia, depression.  Patient presented to the [facility] ED 2 days ago for chief complaint of generalized weakness and hypotension along with vomiting and diarrhea .   # Dysphagia- throat region- some regurgitation described. No odynophagia. Denies nausea or abd pain. No abdominal tenderness on exam.   # Vomiting -green emesis, no hematemesis - LFT wnl; TB mildly increased above baseline with normal liver and no biliary dialtion on CT abd/pelvis. Hx of cholecystectomy.  Can fractionate if continues to increase, suspect reactive/stress induced # Unintended Weight loss 2/2 to above admits to poor nutrition without supplements staff report  poor intake -  CT abd/pelvis: no acute findings to explain sx. Small HH. Stomach was normal.   -  NPO now - Order placed for esophogram - Seen by RD and SLP- notes reviewed.   - If barium swallow is negative- may f/up with Dr. Vicente Males.  - He is not an ideal candidate for IV sedation / EGD at this time, but pending findings on barium swallow.  - likely  contributing to his coagulopathy  # Diarrhea:  -Reports diarrhea 3-4 daily during chemotherapy and now once daily . This couold be a SE for Treanda and is slowly improving.  - Infectious work up negative 01/08/23 with Neg PCR and C diff. Current stool PCR ordered -no sample has been provided. No BM in 2 days.  - Hx of cholecystomy and once infectious work up is completed, will consider initiation with cholestyramine if diarrhea returns.  - Last csy 2015.  - If diarrhea becomes a problem, can consider FS to r/o CMV.  # Coagulopathy # SIRS # Mantle cell lymphoma s/p CTX completed  # AKI - pancytopenia - Management  by hospital team - Monitor CBC, CMET, TSH  labs   Thank you for the consult. Please call with questions or concerns.  Portions of the record may have been created with voice recognition software. Occasional wrong-word or 'sound-a-like' substitutions may have occurred due to the inherent limitations of voice recognition software.  Read the chart carefully and recognize, using context, where substitutions may have occurred.   Denice Paradise, [facility] [phone]

## 2023-01-14 NOTE — Assessment & Plan Note (Addendum)
Patient is having a lot of GERD issues and spitting out most of the medications. -GI was consulted for abnormal esophageal gram.  EGD with benign esophageal stenosis and narrowing of GEJ junction which was dilated -PPI twice daily -Follow-up with primary GI as outpatient-patient reports ongoing dysphagia.  Poor p.o. intake and chronic diarrhea with associated weight loss

## 2023-01-14 NOTE — Evaluation (Signed)
Clinical/Bedside Swallow Evaluation Patient Details  Name: Gary [name] MRN: [id number] Date of Birth: 1945/12/21  Today's Date: 01/14/2023 Time: SLP Start Time (ACUTE ONLY): 0900 SLP Stop Time (ACUTE ONLY): 0917 SLP Time Calculation (min) (ACUTE ONLY): 17 min  Past Medical History:  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    Gastroparesis    Graves disease    Hypercholesterolemia    Hypertension    Mantle cell lymphoma 07/2014   Peripheral neuropathy    Shingles    Past Surgical History:  Past Surgical History:  Procedure Laterality Date   CHOLECYSTECTOMY     NEPHRECTOMY     right   PACEMAKER INSERTION N/A 12/07/2017   Procedure: INSERTION PACEMAKER DUAL CHAMBER INITIAL INSERT;  Surgeon: Isaias Cowman, MD;  Location: [facility] ORS;  Service: Cardiovascular;  Laterality: N/A;   PORTA CATH INSERTION N/A 11/03/2017   Procedure: PORTA CATH INSERTION;  Surgeon: Katha Cabal, MD;  Location: [place] [facility];  Service: Cardiovascular;  Laterality: N/A;   HPI:  Per H&P, pt " is a 77 year old male with history of recurrent stage III mantle cell lymphoma who has completed 43 cycles of Rituxan and Treanda, depression, insulin-dependent diabetes mellitus, hypertension, hyperlipidemia, who presents to the emergency department for chief concerns of generalized weakness and hypotension.     Patient is reported to have nausea and vomiting and diarrhea and this has been ongoing for the last several months." Per dietician note, "He shares that he has had poor oral intake over the past 1.5 months. Hs shares he will feel hungry, but will not desire food once it is put in front of him. When he tries to eat, he reports he will get episodes of regurgitation and food will "come right back up". Pt has not noticed that certain foods aggravate it; states this  happens with all food. Pt reports he drank one cup of orange juice and sipped on a second cup, which "came right back up". Noted meal completions 0-30%. Pt has access to dentures, which he uses while eating. He drinks [employer] Breakfast at home as he does not tolerate Ensure supplements well. "    Assessment / Plan / Recommendation  Clinical Impression   Pt seen today for BSE. Pt sitting up in bed watching tv upon ST arrival. Pt alert, cooperative, and generally weak t/o eval. Pt w/ reports of nausea and loss of appetite w/ foods in past several months. Pt reports nausea is worse w/ some foods/drinks than others. Pt left sitting up in bed w/ bed alarm set and call button within reach.  Pt on RA; afebrile: WBC decreased. Of note: Pt presented w/ congested cough prior to administration of po's.  OM exam completed and notable for significant nausea c/b pt gagging from the action of moving tongue from side to side. Otherwise, lingual/labial strength/ROM/symmetry WFL.  Pt appears to present w/ a functional oropharyngeal swallow during this eval. No overt, consistent s/s of aspiration noted w/ trials given such as coughing, throat clearing, nor wet vocal quality. Pt presented w/ s/s concerning for potential esophageal/GI related issues including regurgitation of solid, belching, hiccupping, and gagging. Following strict reflux precautions at all po's can help reduce these s/s.  Pt observed w/ limited trials of thin liquids via straw, puree, and solid (d/t nausea). Pt fed self independently and required no cues  from ST t/o eval. Pt's oral phase appeared [facility] c/b timely A-P bolus transfer, good bolus control/management, and clear oral cavity post-po's. Pt's pharyngeal phase appeared [facility] c/b seemingly timely pharyngeal swallow and clear vocal quality post-po's. No overt, consistent s/s of aspiration noted such as coughing, throat clearing, nor wet vocal quality. Noted 1-2 delayed cough after meal; cough  was not consistent and not noted b/t trials of liquids -- concern for ?reflux-related cough. Noted pt regurgitated trial of solid -- pt w/ c/o nausea. No further trials given. Pt frequently belched, hiccupped, and intermittently gagged during assessment.   Pt educated on general aspiration/reflux precautions, diet recommendation, and recommendation for GI consult to address his concerns. Pt appreciative/agreed.  Recommend continue regular diet w/ thin liquids. Pt able to feed self. Follow Strict Reflux precautions at all po's. Recommend general aspiration precautions. Recommend meds whole w/ thin liquids (shift to whole in applesauce if difficulties swallowing are noted). Recommend GI consult. RN/Pt updated/agreed.  No acute ST needs at this time. ST will s/o. MD to reconsult if any new needs arise during admission.  SLP Visit Diagnosis: Dysphagia, unspecified (R13.10)    Aspiration Risk  Moderate aspiration risk;Risk for inadequate nutrition/hydration;Mild aspiration risk (of regurgitated material)    Diet Recommendation   Regular diet w/ thin liquids; follow Strict Reflux precautions at all po's.  Medication Administration: Whole meds with liquid (as tolerated)    Other  Recommendations Recommended Consults: Consider GI evaluation;Consider esophageal assessment Oral Care Recommendations: Oral care before and after PO;Oral care BID    Recommendations for follow up therapy are one component of a multi-disciplinary discharge planning process, led by the attending physician.  Recommendations may be updated based on patient status, additional functional criteria and insurance authorization.  Follow up Recommendations No SLP follow up      Assistance Recommended at Discharge  PRN  Functional Status Assessment Patient has had a recent decline in their functional status and demonstrates the ability to make significant improvements in function in a reasonable and predictable amount of time.   Frequency and Duration   NA         Prognosis        Swallow Study   General Date of Onset: 01/12/23 HPI: Per H&P, pt " is a 77 year old male with history of recurrent stage III mantle cell lymphoma who has completed 43 cycles of Rituxan and Treanda, depression, insulin-dependent diabetes mellitus, hypertension, hyperlipidemia, who presents to the emergency department for chief concerns of generalized weakness and hypotension.     Patient is reported to have nausea and vomiting and diarrhea and this has been ongoing for the last several months." Per dietician note, "He shares that he has had poor oral intake over the past 1.5 months. Hs shares he will feel hungry, but will not desire food once it is put in front of him. When he tries to eat, he reports he will get episodes of regurgitation and food will "come right back up". Pt has not noticed that certain foods aggravate it; states this happens with all food. Pt reports he drank one cup of orange juice and sipped on a second cup, which "came right back up". Noted meal completions 0-30%. Pt has access to dentures, which he uses while eating. He drinks [employer] Breakfast at home as he does not tolerate Ensure supplements well. " Type of Study: Bedside Swallow Evaluation Diet Prior to this Study: Regular;Thin liquids (Level 0) Temperature Spikes Noted: No (WBC 2.9) Respiratory Status: Room  air History of Recent Intubation: No Behavior/Cognition: Cooperative;Alert;Pleasant mood Oral Cavity Assessment: Within Functional Limits Oral Care Completed by SLP: No Oral Cavity - Dentition: Dentures, bottom;Missing dentition Vision: Functional for self-feeding Self-Feeding Abilities: Able to feed self Patient Positioning: Upright in bed Baseline Vocal Quality: Normal Volitional Swallow: Able to elicit    Oral/Motor/Sensory Function Overall Oral Motor/Sensory Function: Within functional limits   Ice Chips Ice chips: Not tested   Thin Liquid  Thin Liquid: Within functional limits Presentation: Straw;Self Fed (6+ sips)    Nectar Thick Nectar Thick Liquid: Not tested   Honey Thick Honey Thick Liquid: Not tested   Puree Puree: Within functional limits Presentation: Self Fed;Spoon (x2)   Solid     Solid: Impaired Presentation: Self Fed (x2 bites) Other Comments: regurgitated d/t nausea     Randall Hiss Graduate Clinician [place], Speech Pathology   Randall Hiss 01/14/2023,10:07 AM

## 2023-01-14 NOTE — Plan of Care (Signed)

## 2023-01-14 NOTE — Assessment & Plan Note (Addendum)
improving.  Insulin infusion ordered but never started and his blood glucose was managed with resistant sliding scale.  Bicarb infusion was discontinued overnight -Continue with p.o. bicarb now -Continue to monitor

## 2023-01-14 NOTE — Consult Note (Signed)
[facility] Nurse Consult Note:patient with a history of lymphedema; last seen by [facility] 12/23/2022 and does have home health that comes out and places legs in unna boots  Reason for Consult: open wounds on legs d/t cellulitis  Wound type:venous  Pressure Injury POA: NA  Measurement: R anterior leg 2 areas approximately 1 cm x 1 cm each partial thickness skin loss, L anterior leg 4 cm x 3 cm area of partial thickness skin loss  Wound bed:100% pink and moist  Drainage (amount, consistency, odor) minimal serous  Periwound: bilateral legs with cracked peeling sloughing skin, dark red erythema  Dressing procedure/placement/frequency: Clean legs with soap and water, dry thoroughly.  Apply Xeroform gauze Kellie Simmering 294) to posterior and anterior lower legs daily, wrap with Kerlix starting from just above toes ending right below knees.  Secure with 4" Ace wrap in same fashion as Kerlix.    POC discussed with patient and bedside nurse.  WOC will not follow at this time.  Re-consult if further needs arise.   Thank you,    Shelton Silvas MSN, RN-BC, [employer] [phone]

## 2023-01-14 NOTE — Assessment & Plan Note (Addendum)
INR elevated at 3.5 on admission which has been improved now.  Patient was not on Coumadin.  Was given heparin subcu for DVT prophylaxis.  Hepatic function with mildly low total protein and albumin and rest normal.  Also found to have significantly elevated APTT which started improving today.  DIC labs and peripheral smear negative. Did receive 1 dose of IV vitamin K

## 2023-01-14 NOTE — Assessment & Plan Note (Addendum)
Patient has elevated lactic acid of 2.1, leukopenia, elevated heart rate, low normal tensive, with possible source of infection, cellulitis of the right leg vs CAP Blood cultures remain negative, procalcitonin elevated.  Lactic acidosis resolved. Improving lower extremity cellulitis. Continue ceftriaxone and Zithromax to complete a 5-day course  Continue linezolid for total of 7 days

## 2023-01-14 NOTE — Progress Notes (Addendum)
       CROSS COVER NOTE  NAME: FAARIS COLLETT MRN: [id number] DOB : 1946/03/29    HPI/Events of Note   Report:insulin drip not started when ordered. Potassium ordered earlier discontinued by previous nurse. Paitent had received 1 liter LR for presumed DKA. HCO3 started for acidosis. No prior blood gas  On review of chart:K now 3.1, sodium 130, anion gap 12, CO2 19 cbg 205    Assessment and  Interventions   Assessment: Patient alert after wakening. BP soft with MAP 61. Patient states he feels better today then yesterday  Plan: Vbg normal pH. Normal HCO3 - discontinue bicarb infusion Change IVF to NS at 125 after 500 cc bolus Resistant insulin SS every 4 hours Potassium replacement total  80 meq       Donnie Mesa NP [facility]

## 2023-01-14 NOTE — TOC Initial Note (Signed)
Transition of Care [facility]) - Initial/Assessment Note    Patient Details  Name: Gary [name] MRN: [id number] Date of Birth: 11/22/1945  Transition of Care [facility]) CM/SW Contact:    Laurena Slimmer, RN Phone Number: 01/14/2023, 12:45 PM  Clinical Narrative:                  Transition of Care [facility]) Screening Note   Patient Details  Name: Gary [name] Date of Birth: 09-09-1946   Transition of Care [facility]) CM/SW Contact:    Laurena Slimmer, RN Phone Number: 01/14/2023, 12:46 PM    Transition of Care Department [facility]) has reviewed patient and no TOC needs have been identified at this time. We will continue to monitor patient advancement through interdisciplinary progression rounds. If new patient transition needs arise, please place a TOC consult.          Patient Goals and CMS Choice            Expected Discharge Plan and Services                                              Prior Living Arrangements/Services                       Activities of Daily Living Home Assistive Devices/Equipment: Environmental consultant (specify type), Eyeglasses, Dentures (specify type), Oxygen ADL Screening (condition at time of admission) Patient's cognitive ability adequate to safely complete daily activities?: Yes Is the patient deaf or have difficulty hearing?: No Does the patient have difficulty seeing, even when wearing glasses/contacts?: No Does the patient have difficulty concentrating, remembering, or making decisions?: No Patient able to express need for assistance with ADLs?: Yes Does the patient have difficulty dressing or bathing?: No Independently performs ADLs?: No Communication: Independent Dressing (OT): Independent Grooming: Independent Feeding: Independent Bathing: Needs assistance Is this a change from baseline?: Change from baseline, expected to last <3 days Toileting: Needs assistance Is this a change from baseline?: Change from baseline, expected to last <3  days In/Out Bed: Needs assistance Is this a change from baseline?: Change from baseline, expected to last <3 days Walks in Home: Needs assistance Is this a change from baseline?: Change from baseline, expected to last >3 days Does the patient have difficulty walking or climbing stairs?: Yes Weakness of Legs: Both Weakness of Arms/Hands: None  Permission Sought/Granted                  Emotional Assessment              Admission diagnosis:  Dehydration [E86.0] SIRS (systemic inflammatory response syndrome) [R65.10] Cellulitis, unspecified cellulitis site [L03.90] Patient Active Problem List   Diagnosis Date Noted   Elevated INR 01/13/2023   Pressure injury of skin 01/13/2023   Orthostatic hypotension 01/12/2023   SIRS (systemic inflammatory response syndrome) 01/12/2023   AKI (acute kidney injury) 01/12/2023   Depression, major, single episode, mild 11/15/2022   Recurrent falls 08/29/2021   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    Left rib fracture_7th rib    Leukocytosis    HTN (hypertension)    Hypothyroid    CAD (coronary artery disease)    Porokeratosis 05/15/2021   Aortic atherosclerosis 05/05/2021   Emphysema lung 05/05/2021   Thrombocytopenia 04/29/2021   Splinter of foot without infection 02/23/2021   Arm  laceration 02/09/2021   Hypokalemia 10/24/2020   Injury by nail 08/15/2020   Shingles 07/24/2020   Rash 07/02/2020   Colon cancer screening 07/02/2020   Sick sinus syndrome 12/28/2019   Acquired absence of kidney 09/13/2019   Edema of lower extremity 09/13/2019   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, or unspecified 09/13/2019   Proteinuria 09/13/2019   Lower extremity pain, bilateral    Cellulitis 08/18/2019   Left hip pain 07/16/2019   Pain due to onychomycosis of toenails of both feet 04/03/2019   Swelling of limb 02/07/2019   CKD (chronic kidney disease) stage 3, GFR 30-59  ml/min 09/16/2018   Left shoulder pain 09/16/2018   Chronic cholecystitis 12/29/2017   Graves disease 12/29/2017   Peripheral neuropathy 12/29/2017   S/p nephrectomy 12/29/2017   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   Symptomatic bradycardia 12/02/2017   CKD (chronic kidney disease), stage IIIa 0000000   Chronic systolic CHF (congestive heart failure) 12/02/2017   Saturday night paralysis 11/12/2017   Hypoglycemia 10/28/2017   Goals of care, counseling/discussion 10/24/2017   Lymphedema 10/20/2017   Iron deficiency anemia 09/11/2017   Fall 08/13/2017   Humerus fracture 08/13/2017   Anemia 01/12/2017   Bilateral carotid artery stenosis 06/29/2016   Hand laceration 12/16/2015   Adjustment disorder with depressed mood 08/11/2015   Cardiomyopathy, idiopathic 04/22/2015   CAD in native artery 11/02/2014   Mantle cell lymphoma 08/03/2014   History of colonic polyps 04/29/2014   Irregular heart beat 04/22/2014   SOB (shortness of breath) 04/22/2014   Stress 03/21/2014   PVC (premature ventricular contraction) 02/21/2014   GERD (gastroesophageal reflux disease) 10/23/2013   Gastroparesis 06/25/2013   Chest pain 04/13/2013   Thyroid disease 04/13/2013   Diabetes 04/13/2013   Essential hypertension, benign 04/13/2013   Hypercholesterolemia 04/13/2013   Constipation, unspecified 10/13/1999   Depression, unspecified 10/13/1999   Dry eye syndrome of bilateral lacrimal glands 10/13/1999   Chronic venous insufficiency 10/13/1999   Athscl heart disease of native coronary artery w/o ang pctrs 10/13/1999   PCP:  [name], MD Pharmacy:   [id number] [place]ina Rabon, [place] [address] [place] [address] Phone: [phone] Fax: [phone]  [place], [place] - [address] [place] [address] Phone: [phone] Fax: [phone]     Social Determinants of Health (SDOH) Social History: SDOH Screenings    Food Insecurity: No Food Insecurity (01/12/2023)  Housing: Low Risk  (01/12/2023)  Transportation Needs: No Transportation Needs (01/12/2023)  Recent Concern: Transportation Needs - Unmet Transportation Needs (01/11/2023)  Utilities: Not At Risk (01/12/2023)  Alcohol Screen: Low Risk  (11/27/2020)  Depression (PHQ2-9): High Risk (01/12/2023)  Financial Resource Strain: Low Risk  (03/10/2022)  Physical Activity: Inactive (03/10/2022)  Social Connections: Socially Isolated (03/10/2022)  Stress: No Stress Concern Present (03/10/2022)  Tobacco Use: High Risk (01/12/2023)   SDOH Interventions:     Readmission Risk Interventions    08/30/2021   10:48 AM  Readmission Risk Prevention Plan  Transportation Screening Complete  PCP or Specialist Appt within 3-5 Days Complete  HRI or Home Care Consult Complete  Social Work Consult for [place] Planning/Counseling Complete  Palliative Care Screening Not Applicable  Medication Review Press photographer) Complete

## 2023-01-14 NOTE — Progress Notes (Signed)
Progress Note   Patient: Gary [name] [id number] DOB: 1945-12-22 DOA: 01/12/2023     2 DOS: the patient was seen and examined on 01/14/2023   Brief hospital course: [name] is a 77 year old male with history of recurrent stage III mantle cell lymphoma who has completed 43 cycles of Rituxan and Treanda, depression, insulin-dependent diabetes mellitus, hypertension, hyperlipidemia, who presents to the emergency department for chief concerns of generalized weakness and hypotension.  Patient is reported to have nausea and vomiting and diarrhea and this has been ongoing for the last several months  Vitals in the ED showed temperature of 98, respiration rate of 16, heart rate of 108, blood pressure 88/60, SpO2 of 99% on room air.  Serum sodium is 134, potassium 3.5, chloride 101, bicarb 19, BUN of 37, serum creatinine of 2.46, EGFR of 27, nonfasting blood glucose 70, WBC 3.3, hemoglobin 11.3, platelets of 92.  High sensitive troponin is 27.  Lactic acid is 2.1.  Blood cultures x 2 are in process.  ED treatment: Ceftriaxone 2 g IV one-time dose, lactated ringer 1 L bolus, vancomycin.  4/3:Labs with slight worsening of metabolic acidosis, creatinine with mild improvement to 2.43, worsening leukopenia and thrombocytopenia but all cell lines decreased-some dilutional effect.  On imaging which include CT head, cervical spine, CT abdominal and pelvis were negative for any acute abnormality.  INR at 3.5-discontinue DVT prophylaxis with heparin-SCDs ordered. Patient again becoming hypotensive-give him 1 more liter of bolus and also starting midodrine. Still no obvious source of infection as blood cultures remain negative in 24 hours. Will continue broad-spectrum antibiotics.  Concern of lower extremity cellulitis.  4/4: Blood pressure remained on softer side, midodrine dose was increased to 10 mg, 3 times daily.  GI pathogen panel negative, C. difficile negative, Blood cultures remain negative.   Labs with very slowly improving creatinine to 2.33, worsening now anion gap metabolic acidosis with bicarb at 14, mildly elevated T. bili at 1.4.  Lactic acid was normal but found to have elevated beta hydroxybutyric acid, concern of developing DKA. Rechecking BMP if Remained open we will start him on Endo tool. INR still elevated, APTT started improving.  DIC labs negative.  Vitamin K levels pending  Assessment and Plan: * Sepsis due to cellulitis Patient has elevated lactic acid of 2.1, leukopenia, elevated heart rate, low normal tensive, with possible source of infection, cellulitis of the right leg vs CAP Blood cultures remain negative, procalcitonin elevated.  Lactic acidosis resolved. Continue to have significant lower extremity erythema and edema, right more than left, with weeping blisters.  Will Continue ceftriaxone and Zithromax to complete a 5-day course  Vancomycin is being switched with linezolid.  Elevated INR INR elevated at 3.5.  Patient was not on Coumadin.  Was given heparin subcu for DVT prophylaxis.  Hepatic function with mildly low total protein and albumin and rest normal.  Also found to have significantly elevated APTT which started improving today.  INR still elevated at 3.4.  DIC labs and peripheral smear negative.  Vitamin K levels pending. -Continue to monitor  Increased anion gap metabolic acidosis Patient this morning with anion gap metabolic acidosis with worsening BiCap and Open to 18.  Lactic acid was normal.  Elevated beta hydroxybutyric acid and blood glucose level.  Concern for DKA.   -Check BMP and if gap remains open we will start him on Endo tool. -Started him on bicarb infusion  AKI (acute kidney injury) On CKD 3B Baseline serum creatinine is 1.84-2.04/eGFR  34-38 Renal functions seems stable, did received IV fluid including bolus and infusion. -Monitor renal function -Avoid nephrotoxins  Essential hypertension, benign Blood pressure currently soft  requiring fluid resuscitation. Started on midodrine  Hypothyroid Home levothyroxine 25 mcg daily before breakfast resumed  Adjustment disorder with depressed mood Fluoxetine 20 mg daily resumed  Hypercholesterolemia Pravastatin 40 mg daily resumed  Mantle cell lymphoma Last chemo was in January 2024.  Being followed up by oncology as outpatient.  GERD (gastroesophageal reflux disease) Patient is having a lot of GERD issues and spitting out most of the medications. -Swallow evaluation ordered -PPI if needed       Subjective: Patient continued to feel very lethargic and weak.  Nursing concern of regarding on meds and spitting them out.  Appetite remained poor.  Physical Exam: Vitals:   01/14/23 0400 01/14/23 0815 01/14/23 1111 01/14/23 1112  BP: (!) 99/54 (!) 104/45 (!) 103/57   Pulse: 73 70 75 75  Resp: 16 16 14    Temp: 97.8 F (36.6 C) 97.8 F (36.6 C) (!) 97.4 F (36.3 C)   TempSrc: Oral Oral    SpO2: 100% 96% (!) 75% 100%  Weight:      Height:       General.  Chronically ill-appearing elderly man, in no acute distress. Pulmonary.  Lungs clear bilaterally, normal respiratory effort. CV.  Regular rate and rhythm, no JVD, rub or murmur. Abdomen.  Soft, nontender, nondistended, BS positive. CNS.  Alert and oriented .  No focal neurologic deficit. Extremities.  No edema, no cyanosis, pulses intact and symmetrical. Psychiatry.  Judgment and insight appears normal.    Data Reviewed: Prior data reviewed.  Family Communication: Called Sister listed in his chart with no response.  Disposition: Status is: Inpatient Remains inpatient appropriate because: Severity of illness  Planned Discharge Destination: Home  DVT prophylaxis.  SCDs Time spent: 52 minutes  This record has been created using Systems analyst. Errors have been sought and corrected,but may not always be located. Such creation errors do not reflect on the standard of care.    Author: Lorella Nimrod, MD 01/14/2023 3:04 PM  For on call review [email or web address].

## 2023-01-14 NOTE — Progress Notes (Signed)
Nutrition Follow-up  DOCUMENTATION CODES:   Not applicable  INTERVENTION:   -RD will follow for diet advancement and add supplements as appropriate -If intake remains inadequate, may need to consider enteral nutrition support:   Initiate Osmolite 1.5 @ 20 ml/hr and increase by 10 ml every 4 hours to goal rate of 60 ml/hr.   60 ml Prosource TF BID.  155 ml free water flush every 4 hours    Tube feeding regimen provides 2240 kcal (100% of needs), 130 grams of protein, and 1097 ml of H2O.  Total free water: 2027 ml daily  NUTRITION DIAGNOSIS:   Inadequate oral intake related to poor appetite as evidenced by percent weight loss, meal completion < 25%.  Ongoing  GOAL:   Patient will meet greater than or equal to 90% of their needs  Progressing   MONITOR:   PO intake, Supplement acceptance  REASON FOR ASSESSMENT:   Malnutrition Screening Tool    ASSESSMENT:   Pt with history of recurrent stage III mantle cell lymphoma who has completed 43 cycles of Rituxan and Treanda, depression, insulin-dependent diabetes mellitus, hypertension, hyperlipidemia, who presents for chief concerns of generalized weakness and hypotension.  Reviewed I/O's: +871 ml x 24 hours and +1.9 L since admission  UOP: 375 ml x 24 hours   RD re-consulted for assessment.   Pt underwent SLP eval today. Noted recommendations to continue regular diet to GI consult which has been placed.   Spoke with pt at bedside. He reports he is doing well with liquids, but struggles with solid foods ("I did good until I ate a graham cracker- it came right back up"). Pt reports that he has had difficulty with medications, but was able to take "important ones" and pills that were small and slimy were ones he did best with. Pt reports that he has history of gastroparesis, but has not taken medications "for years".   Case discussed with RN, MD, and SLP. Pt now NPO. Plan for GI consult. If pt remains unable to take PO's, may  need to consider nutrition support.   Medications reviewed and include ferrous sulfate.  Labs reviewed: CBGS: 277-286 (inpatient orders for glycemic control are 10 units insulin glargine-yfgn daily).  Pt had his insulin pump disconnected and has transitioned to subcutaneous injections.   Diet Order:   Diet Order             Diet NPO time specified  Diet effective now                   EDUCATION NEEDS:   Education needs have been addressed  Skin:  Skin Assessment: Skin Integrity Issues: Skin Integrity Issues:: Stage II Stage II: rt buttocks  Last BM:  01/12/23  Height:   Ht Readings from Last 1 Encounters:  01/12/23 5\' 10"  (1.778 m)    Weight:   Wt Readings from Last 1 Encounters:  01/12/23 93 kg    Ideal Body Weight:  75.5 kg  BMI:  Body mass index is 29.42 kg/m.  Estimated Nutritional Needs:   Kcal:  2050-2250  Protein:  115-130 grams  Fluid:  > 2 L    Loistine Chance, RD, LDN, [place] Registered Dietitian II Certified Diabetes Care and Education Specialist Please refer to [facility] for RD and/or RD on-call/weekend/after hours pager

## 2023-01-15 ENCOUNTER — Inpatient Hospital Stay: Payer: Medicare Other

## 2023-01-15 DIAGNOSIS — N179 Acute kidney failure, unspecified: Secondary | ICD-10-CM | POA: Diagnosis not present

## 2023-01-15 DIAGNOSIS — I10 Essential (primary) hypertension: Secondary | ICD-10-CM | POA: Diagnosis not present

## 2023-01-15 DIAGNOSIS — R6510 Systemic inflammatory response syndrome (SIRS) of non-infectious origin without acute organ dysfunction: Secondary | ICD-10-CM | POA: Diagnosis not present

## 2023-01-15 DIAGNOSIS — R791 Abnormal coagulation profile: Secondary | ICD-10-CM | POA: Diagnosis not present

## 2023-01-15 LAB — BASIC METABOLIC PANEL
Anion gap: 10 (ref 5–15)
Anion gap: 9 (ref 5–15)
Anion gap: 9 (ref 5–15)
BUN: 42 mg/dL — ABNORMAL HIGH (ref 8–23)
BUN: 46 mg/dL — ABNORMAL HIGH (ref 8–23)
BUN: 48 mg/dL — ABNORMAL HIGH (ref 8–23)
CO2: 20 mmol/L — ABNORMAL LOW (ref 22–32)
CO2: 20 mmol/L — ABNORMAL LOW (ref 22–32)
CO2: 21 mmol/L — ABNORMAL LOW (ref 22–32)
Calcium: 7.3 mg/dL — ABNORMAL LOW (ref 8.9–10.3)
Calcium: 7.5 mg/dL — ABNORMAL LOW (ref 8.9–10.3)
Calcium: 7.6 mg/dL — ABNORMAL LOW (ref 8.9–10.3)
Chloride: 101 mmol/L (ref 98–111)
Chloride: 105 mmol/L (ref 98–111)
Chloride: 106 mmol/L (ref 98–111)
Creatinine, Ser: 1.75 mg/dL — ABNORMAL HIGH (ref 0.61–1.24)
Creatinine, Ser: 1.99 mg/dL — ABNORMAL HIGH (ref 0.61–1.24)
Creatinine, Ser: 2.02 mg/dL — ABNORMAL HIGH (ref 0.61–1.24)
GFR, Estimated: 34 mL/min — ABNORMAL LOW (ref >60)
GFR, Estimated: 35 mL/min — ABNORMAL LOW (ref >60)
GFR, Estimated: 40 mL/min — ABNORMAL LOW (ref >60)
Glucose, Bld: 124 mg/dL — ABNORMAL HIGH (ref 70–99)
Glucose, Bld: 152 mg/dL — ABNORMAL HIGH (ref 70–99)
Glucose, Bld: 93 mg/dL (ref 70–99)
Potassium: 3.6 mmol/L (ref 3.5–5.1)
Potassium: 3.8 mmol/L (ref 3.5–5.1)
Potassium: 3.9 mmol/L (ref 3.5–5.1)
Sodium: 132 mmol/L — ABNORMAL LOW (ref 135–145)
Sodium: 134 mmol/L — ABNORMAL LOW (ref 135–145)
Sodium: 135 mmol/L (ref 135–145)

## 2023-01-15 LAB — GASTROINTESTINAL PANEL BY PCR, STOOL (REPLACES STOOL CULTURE)

## 2023-01-15 LAB — GLUCOSE, CAPILLARY
Glucose-Capillary: 287 mg/dL — ABNORMAL HIGH (ref 70–99)
Glucose-Capillary: 115 mg/dL — ABNORMAL HIGH (ref 70–99)
Glucose-Capillary: 89 mg/dL (ref 70–99)
Glucose-Capillary: 119 mg/dL — ABNORMAL HIGH (ref 70–99)
Glucose-Capillary: 161 mg/dL — ABNORMAL HIGH (ref 70–99)

## 2023-01-15 LAB — BETA-HYDROXYBUTYRIC ACID
Beta-Hydroxybutyric Acid: 0.44 mmol/L — ABNORMAL HIGH (ref 0.05–0.27)
Beta-Hydroxybutyric Acid: 0.92 mmol/L — ABNORMAL HIGH (ref 0.05–0.27)

## 2023-01-15 LAB — CBC
HCT: 28.4 % — ABNORMAL LOW (ref 39.0–52.0)
Hemoglobin: 9.5 g/dL — ABNORMAL LOW (ref 13.0–17.0)
MCH: 30.4 pg (ref 26.0–34.0)
MCHC: 33.5 g/dL (ref 30.0–36.0)
MCV: 90.7 fL (ref 80.0–100.0)
Platelets: 85 10*3/uL — ABNORMAL LOW (ref 150–400)
RBC: 3.13 MIL/uL — ABNORMAL LOW (ref 4.22–5.81)
RDW: 14.6 % (ref 11.5–15.5)
WBC: 2.4 10*3/uL — ABNORMAL LOW (ref 4.0–10.5)
nRBC: 0.8 % — ABNORMAL HIGH (ref 0.0–0.2)

## 2023-01-15 LAB — PROTIME-INR
INR: 2 — ABNORMAL HIGH (ref 0.8–1.2)
Prothrombin Time: 22.4 seconds — ABNORMAL HIGH (ref 11.4–15.2)

## 2023-01-15 LAB — TSH: TSH: 7.715 u[IU]/mL — ABNORMAL HIGH (ref 0.350–4.500)

## 2023-01-15 LAB — ALBUMIN: Albumin: 2.2 g/dL — ABNORMAL LOW (ref 3.5–5.0)

## 2023-01-15 MED ORDER — LACTATED RINGERS IV SOLN: INTRAVENOUS | Status: AC

## 2023-01-15 MED ORDER — SODIUM BICARBONATE 650 MG PO TABS
1300.0000 mg | ORAL_TABLET | Freq: Two times a day (BID) | ORAL | Status: DC
  Administered 2023-01-15 – 2023-01-21 (×13): 1300 mg via ORAL
  Filled 2023-01-15 (×13): qty 2

## 2023-01-15 MED ORDER — LEVOTHYROXINE SODIUM 50 MCG PO TABS
50.0000 ug | ORAL_TABLET | Freq: Every day | ORAL | Status: DC
  Administered 2023-01-16 – 2023-01-25 (×9): 50 ug via ORAL
  Filled 2023-01-15 (×10): qty 1

## 2023-01-15 NOTE — Progress Notes (Signed)
Spoke with NP earlier on in my shift about plan of care for pt, there was an insulin gtt ordered and no potassium was given yet. 1 liter of LR bolus was being given and a bicarb gtt was in place. BMP, VBG, and recent CBG was done and results were relayed to the NP. New orders were to not start the insulin gtt and placed pt on a resistance scale, novolog was given based on sliding scale and potassium was replaced orally and IV. Last CBG was 115 at 0355, and potassium is at 3.8 this am. Pt's sodium level is 134 at 0407 and pt's BPs still remains soft this morning but pt is asymptomatic. He is alert and oriented, able to answer questions during assessment.

## 2023-01-15 NOTE — Assessment & Plan Note (Signed)
TSH elevated at 7.7 -Increasing the dose of Synthroid to 50

## 2023-01-15 NOTE — TOC Initial Note (Signed)
Transition of Care [facility]) - Initial/Assessment Note    Patient Details  Name: Gary [name] MRN: [id number] Date of Birth: 1945-12-04  [facility] CM/SW Contact:    Garret Reddish, RN Phone Number: 01/15/2023, 11:53 AM  Clinical Narrative:        Chart reviewed.  Noted that patient was admitted for recurrent stage III mantle cell lymphoma.  Patient is currently in ICU being treated for  Sepsis due to cellulitis.  Patient is on IV Zithromax for [employer] PNA.  Patient on IV Rocephin for SIRS/Sepsis.  Patient is also on IV Zyvox for Cellulitis.   I have meet with patient at bedside.  He reports that prior to admission he lives at home by himself. He reports that he was able to bath and dress himself prior to admission. He reports that he was not doing to well at home.  He has a supportive sister that would check on him weekly.  He [facility] for [facility].  [facility].  [facility] delivers patient's medications. Patient uses [facility] to get to appointments.  Patient also reported that [facility]  Patient has a Roller and a single point cane at home.  Patient is not on 02 at home.  Patient does reports that he has home health nursing that comes to his home to change the dressings on his leg.  Patient is active with [facility].  I have confirmed with Elnita Maxwell, with [facility] that patient is active with their agency for Home Health nursing.    TOC will continue to follow for discharge planning.                 Expected Discharge Plan:  (Pending Medical Workup) Barriers to Discharge: No Barriers Identified   Patient Goals and CMS Choice            Expected Discharge Plan and Services   Discharge Planning Services: CM Consult   Living arrangements for the past 2 months: Single Family Home                   DME Agency:  (Patient has a rolling walker and a single  point cane at home.)                  Prior Living Arrangements/Services Living arrangements for the past 2 months: Single Family Home Lives with:: Self Patient language and need for interpreter reviewed:: Yes        Need for Family Participation in Patient Care: Yes (Comment) Care giver support system in place?: Yes (comment) (Patient reports that he has a supportive sister) Current home services:  (Patient reports that he does have a [facility] nurse that does come out to provide wound care on his legs.  He does not know who the agency is.)    Activities of Daily Living Home Assistive Devices/Equipment: Environmental consultant (specify type), Eyeglasses, Dentures (specify type), Oxygen ADL Screening (condition at time of admission) Patient's cognitive ability adequate to safely complete daily activities?: Yes Is the patient deaf or have difficulty hearing?: No Does the patient have difficulty seeing, even when wearing glasses/contacts?: No Does the patient have difficulty concentrating, remembering, or making decisions?: No Patient able to express need for assistance with ADLs?: Yes Does the patient have difficulty dressing or bathing?: No Independently performs ADLs?: No Communication: Independent Dressing (OT): Independent Grooming: Independent  Feeding: Independent Bathing: Needs assistance Is this a change from baseline?: Change from baseline, expected to last <3 days Toileting: Needs assistance Is this a change from baseline?: Change from baseline, expected to last <3 days In/Out Bed: Needs assistance Is this a change from baseline?: Change from baseline, expected to last <3 days Walks in Home: Needs assistance Is this a change from baseline?: Change from baseline, expected to last >3 days Does the patient have difficulty walking or climbing stairs?: Yes Weakness of Legs: Both Weakness of Arms/Hands: None  Permission Sought/Granted   Permission granted to share information with : Yes,  Verbal Permission Granted              Emotional Assessment Appearance:: Appears stated age Attitude/Demeanor/Rapport: Engaged Affect (typically observed): Pleasant Orientation: : Oriented to Self, Oriented to Place, Oriented to  Time, Oriented to Situation      Admission diagnosis:  Dehydration [E86.0] SIRS (systemic inflammatory response syndrome) [R65.10] Cellulitis, unspecified cellulitis site [L03.90] Patient Active Problem List   Diagnosis Date Noted   Increased anion gap metabolic acidosis 01/14/2023   Elevated INR 01/13/2023   Pressure injury of skin 01/13/2023   Orthostatic hypotension 01/12/2023   Sepsis due to cellulitis 01/12/2023   AKI (acute kidney injury) 01/12/2023   Depression, major, single episode, mild 11/15/2022   Recurrent falls 08/29/2021   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    Left rib fracture_7th rib    Leukocytosis    HTN (hypertension)    Hypothyroid    CAD (coronary artery disease)    Porokeratosis 05/15/2021   Aortic atherosclerosis 05/05/2021   Emphysema lung 05/05/2021   Thrombocytopenia 04/29/2021   Splinter of foot without infection 02/23/2021   Arm laceration 02/09/2021   Hypokalemia 10/24/2020   Injury by nail 08/15/2020   Shingles 07/24/2020   Rash 07/02/2020   Colon cancer screening 07/02/2020   Sick sinus syndrome 12/28/2019   Acquired absence of kidney 09/13/2019   Edema of lower extremity 09/13/2019   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, or unspecified 09/13/2019   Proteinuria 09/13/2019   Lower extremity pain, bilateral    Cellulitis 08/18/2019   Left hip pain 07/16/2019   Pain due to onychomycosis of toenails of both feet 04/03/2019   Swelling of limb 02/07/2019   CKD (chronic kidney disease) stage 3, GFR 30-59 ml/min 09/16/2018   Left shoulder pain 09/16/2018   Chronic cholecystitis 12/29/2017   Graves disease 12/29/2017   Peripheral neuropathy  12/29/2017   S/p nephrectomy 12/29/2017   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   Symptomatic bradycardia 12/02/2017   CKD (chronic kidney disease), stage IIIa 12/02/2017   Chronic systolic CHF (congestive heart failure) 12/02/2017   Saturday night paralysis 11/12/2017   Hypoglycemia 10/28/2017   Goals of care, counseling/discussion 10/24/2017   Lymphedema 10/20/2017   Iron deficiency anemia 09/11/2017   Fall 08/13/2017   Humerus fracture 08/13/2017   Anemia 01/12/2017   Bilateral carotid artery stenosis 06/29/2016   Hand laceration 12/16/2015   Adjustment disorder with depressed mood 08/11/2015   Cardiomyopathy, idiopathic 04/22/2015   CAD in native artery 11/02/2014   Mantle cell lymphoma 08/03/2014   History of colonic polyps 04/29/2014   Irregular heart beat 04/22/2014   SOB (shortness of breath) 04/22/2014   Stress 03/21/2014   PVC (premature ventricular contraction) 02/21/2014   GERD (gastroesophageal reflux disease) 10/23/2013   Gastroparesis 06/25/2013   Chest pain 04/13/2013   Thyroid disease 04/13/2013   Diabetes  04/13/2013   Essential hypertension, benign 04/13/2013   Hypercholesterolemia 04/13/2013   Constipation, unspecified 10/13/1999   Depression, unspecified 10/13/1999   Dry eye syndrome of bilateral lacrimal glands 10/13/1999   Chronic venous insufficiency 10/13/1999   Athscl heart disease of native coronary artery w/o ang pctrs 10/13/1999   PCP:  Dale DurhamScott, Charlene, MD Pharmacy:   [facility] [phone][id number] [place], [place] - [address] [place] [address] Phone: [phone] Fax: [phone]  [facility] [place] - [address] [place] [address] Phone: [phone] Fax: [phone]     Social Determinants of Health (SDOH) Social History: SDOH Screenings   Food Insecurity: No Food Insecurity (01/12/2023)  Housing: Low Risk  (01/12/2023)  Transportation Needs: No Transportation Needs (01/12/2023)   Recent Concern: Transportation Needs - Unmet Transportation Needs (01/11/2023)  Utilities: Not At Risk (01/12/2023)  Alcohol Screen: Low Risk  (11/27/2020)  Depression (PHQ2-9): High Risk (01/12/2023)  Financial Resource Strain: Low Risk  (03/10/2022)  Physical Activity: Inactive (03/10/2022)  Social Connections: Socially Isolated (03/10/2022)  Stress: No Stress Concern Present (03/10/2022)  Tobacco Use: High Risk (01/12/2023)   SDOH Interventions:     Readmission Risk Interventions    01/15/2023   11:37 AM 08/30/2021   10:48 AM  Readmission Risk Prevention Plan  Transportation Screening Complete Complete  PCP or Specialist Appt within 3-5 Days Complete Complete  HRI or Home Care Consult Complete Complete  Social Work Consult for Recovery Care Planning/Counseling Complete Complete  Palliative Care Screening Not Applicable Not Applicable  Medication Review Oceanographer(RN Care Manager) Complete Complete

## 2023-01-15 NOTE — Plan of Care (Signed)
Continuing with plan of care. 

## 2023-01-15 NOTE — Progress Notes (Signed)
Progress Note   Patient: Gary [name] CXK:[id number] DOB: 1946/06/02 DOA: 01/12/2023     3 DOS: the patient was seen and examined on 01/15/2023   Brief hospital course: [name] is a 77 year old male with history of recurrent stage III mantle cell lymphoma who has completed 43 cycles of Rituxan and Treanda, depression, insulin-dependent diabetes mellitus, hypertension, hyperlipidemia, who presents to the emergency department for chief concerns of generalized weakness and hypotension.  Patient is reported to have nausea and vomiting and diarrhea and this has been ongoing for the last several months  Vitals in the ED showed temperature of 98, respiration rate of 16, heart rate of 108, blood pressure 88/60, SpO2 of 99% on room air.  Serum sodium is 134, potassium 3.5, chloride 101, bicarb 19, BUN of 37, serum creatinine of 2.46, EGFR of 27, nonfasting blood glucose 70, WBC 3.3, hemoglobin 11.3, platelets of 92.  High sensitive troponin is 27.  Lactic acid is 2.1.  Blood cultures x 2 are in process.  ED treatment: Ceftriaxone 2 g IV one-time dose, lactated ringer 1 L bolus, vancomycin.  4/3:Labs with slight worsening of metabolic acidosis, creatinine with mild improvement to 2.43, worsening leukopenia and thrombocytopenia but all cell lines decreased-some dilutional effect.  On imaging which include CT head, cervical spine, CT abdominal and pelvis were negative for any acute abnormality.  INR at 3.5-discontinue DVT prophylaxis with heparin-SCDs ordered. Patient again becoming hypotensive-give him 1 more liter of bolus and also starting midodrine. Still no obvious source of infection as blood cultures remain negative in 24 hours. Will continue broad-spectrum antibiotics.  Concern of lower extremity cellulitis.  4/4: Blood pressure remained on softer side, midodrine dose was increased to 10 mg, 3 times daily.  GI pathogen panel negative, C. difficile negative, Blood cultures remain negative.   Labs with very slowly improving creatinine to 2.33, worsening now anion gap metabolic acidosis with bicarb at 14, mildly elevated T. bili at 1.4.  Lactic acid was normal but found to have elevated beta hydroxybutyric acid, concern of developing DKA. Rechecking BMP if Remained open we will start him on Endo tool. INR still elevated, APTT started improving.  DIC labs negative.  Vitamin K levels pending  Addendum.  Repeat BMP remained high anion gap metabolic acidosis with elevated blood glucose level.  Patient seems like in DKA.  Transferring to stepdown and starting on Endo tool.  4/5: Blood pressure remained little soft.  CBG improved and gap closed.  [name]  He was later managed with resistant sliding scale every 4 hourly.  Potassium was also repleted overnight.  A.m. labs with some improvement of creatinine to 1.99, bicarb still low at 20-infusion was discontinued overnight and he is now being restarted on p.o. bicarb supplement.  TSH found to be elevated at 7.715-Home Synthroid dose was increased to 50. Esophagogram was ordered by GI today  Assessment and Plan: * Sepsis due to cellulitis Patient has elevated lactic acid of 2.1, leukopenia, elevated heart rate, low normal tensive, with possible source of infection, cellulitis of the right leg vs CAP Blood cultures remain negative, procalcitonin elevated.  Lactic acidosis resolved. Continue to have significant lower extremity erythema and edema, right more than left, with weeping blisters.  Will Continue ceftriaxone and Zithromax to complete a 5-day course  Vancomycin is being switched with linezolid.  Elevated INR INR elevated at 3.5.  Patient was not on Coumadin.  Was given heparin subcu for DVT prophylaxis.  Hepatic function with mildly low total protein and albumin and rest normal.  Also found to have significantly elevated APTT which started improving today.  INR  still elevated at 3.4.  DIC labs and peripheral smear negative.  Vitamin K levels pending. -Continue to monitor  Increased anion gap metabolic acidosis Improving.  Insulin infusion ordered but never started and his blood glucose was managed with resistant sliding scale.  Bicarb infusion was discontinued overnight -Continue with p.o. bicarb now -Continue to monitor  AKI (acute kidney injury) With CKD 3B Baseline serum creatinine is 1.84-2.04/eGFR 34-38 Renal functions seems stable, and slowly improving, did received IV fluid including bolus and infusion. -Monitor renal function -Avoid nephrotoxins  Essential hypertension, benign Blood pressure currently soft requiring fluid resuscitation. Started on midodrine  Hypothyroid TSH elevated at 7.7 -Increasing the dose of Synthroid to 50  Adjustment disorder with depressed mood Fluoxetine 20 mg daily resumed  Hypercholesterolemia Pravastatin 40 mg daily resumed  Mantle cell lymphoma Last chemo was in January 2024.  Being followed up by oncology as outpatient.  GERD (gastroesophageal reflux disease) Patient is having a lot of GERD issues and spitting out most of the medications. -Swallow evaluation ordered -PPI if needed   Diabetes CBG today within goal.  There was concern of DKA with high anion gap metabolic acidosis with hyperglycemia yesterday. -Continue with SSI      Subjective: Patient was feeling little improved when seen today.  Appetite remained poor but he was n.p.o. for esophagogram later today  Physical Exam: Vitals:   01/15/23 0800 01/15/23 0900 01/15/23 1400 01/15/23 1453  BP: (!) 93/59 (!) 110/48 100/60 (!) 110/47  Pulse: (!) 58 (!) 59  (!) 59  Resp: 15 15 16  (!) 22  Temp: (!) 97.5 F (36.4 C)   (!) 97.5 F (36.4 C)  TempSrc: Oral     SpO2: 100% 100%  95%  Weight:      Height:       General.  Frail elderly man, in no acute distress. Pulmonary.  Lungs clear bilaterally, normal respiratory effort. CV.   Regular rate and rhythm, no JVD, rub or murmur. Abdomen.  Soft, nontender, nondistended, BS positive. CNS.  Alert and oriented .  No focal neurologic deficit. Extremities.  Bilateral LE with Ace wrap, erythema seems improving Psychiatry.  Judgment and insight appears normal.   Data Reviewed: Prior data reviewed.  Family Communication: Talked with sister on phone  Disposition: Status is: Inpatient Remains inpatient appropriate because: Severity of illness  Planned Discharge Destination: Home  DVT prophylaxis.  SCDs Time spent: 50 minutes  This record has been created using Conservation officer, historic buildings. Errors have been sought and corrected,but may not always be located. Such creation errors do not reflect on the standard of care.   Author: Arnetha Courser, MD 01/15/2023 3:22 PM  For on call review [email or web address].

## 2023-01-15 NOTE — Progress Notes (Signed)
[facility]enterology Inpatient Progress Note  Subjective: Patient seen for daily visit for follow-up of dysphagia and history of switching things around his mouth in order to swallow. He is currently being treated in the ICU for sepsis due to cellulitis and DM. He completed barium swallow today results pending.  He had also reported problems with diarrhea and this has improved since admission.  He reports a good loose to soft stool today x1 . PCR Stool study negative.    He denies further diarrhea.  No abdominal pain, nausea or vomiting.  He has been swallowing liquids well today.  Reports that he still has a little gagging when he tries to swallow pills. No pill impactions.  No chest pain.  Reports shortness of breath.  He feels stronger today.  Objective: Vital signs in last 24 hours: Temp:  [97.5 F (36.4 C)-97.9 F (36.6 C)] 97.5 F (36.4 C) (04/05 0800) Pulse Rate:  [58-75] 59 (04/05 0900) Resp:  [10-18] 15 (04/05 0900) BP: (85-117)/(41-73) 110/48 (04/05 0900) SpO2:  [88 %-100 %] 100 % (04/05 0900) Weight:  [99.2 kg] 99.2 kg (04/04 1745) Blood pressure (!) 110/48, pulse (!) 59, temperature (!) 97.5 F (36.4 C), temperature source Oral, resp. rate 15, height 5\' 10"  (1.778 m), weight 99.2 kg, SpO2 100 %.    Intake/Output from previous day: 04/04 0701 - 04/05 0700 In: 2564.6 [I.V.:1214.6; IV Piggyback:1350] Out: 160 [Urine:160]  Intake/Output this shift: Total I/O In: 575.7 [I.V.:332.9; IV Piggyback:242.9] Out: -    Gen: NAD. Appears comfortable.   HEENT: [place]/AT. PERRLA. Normal external ear exam.  Chest: CTA, no wheezes.  CV: RR nl S1, S2. No gallops.  Abd: soft, nt, nd. BS+  Ext: Lower ext wrapped, chronic edema - not examined   Neuro: Alert and oriented. Judgement appears normal. Nonfocal.   Lab Results: Results for orders placed or performed during the hospital encounter of 01/12/23 (from the past 24 hour(s))  Basic metabolic panel     Status: Abnormal    Collection Time: 01/14/23  2:49 PM  Result Value Ref Range   Sodium 131 (L) 135 - 145 mmol/L   Potassium 3.5 3.5 - 5.1 mmol/L   Chloride 98 98 - 111 mmol/L   CO2 16 (L) 22 - 32 mmol/L   Glucose, Bld 318 (H) 70 - 99 mg/dL   BUN 51 (H) 8 - 23 mg/dL   Creatinine, Ser 7.67 (H) 0.61 - 1.24 mg/dL   Calcium 7.3 (L) 8.9 - 10.3 mg/dL   GFR, Estimated 28 (L) >60 mL/min   Anion gap 17 (H) 5 - 15  Glucose, capillary     Status: Abnormal   Collection Time: 01/14/23  3:59 PM  Result Value Ref Range   Glucose-Capillary 286 (H) 70 - 99 mg/dL  Glucose, capillary     Status: Abnormal   Collection Time: 01/14/23  5:41 PM  Result Value Ref Range   Glucose-Capillary 287 (H) 70 - 99 mg/dL  Phosphorus     Status: None   Collection Time: 01/14/23  7:49 PM  Result Value Ref Range   Phosphorus 3.2 2.5 - 4.6 mg/dL  Magnesium     Status: None   Collection Time: 01/14/23  7:49 PM  Result Value Ref Range   Magnesium 1.7 1.7 - 2.4 mg/dL  Basic metabolic panel     Status: Abnormal   Collection Time: 01/14/23  7:50 PM  Result Value Ref Range   Sodium 130 (L) 135 - 145 mmol/L   Potassium  3.1 (L) 3.5 - 5.1 mmol/L   Chloride 99 98 - 111 mmol/L   CO2 19 (L) 22 - 32 mmol/L   Glucose, Bld 245 (H) 70 - 99 mg/dL   BUN 50 (H) 8 - 23 mg/dL   Creatinine, Ser 1.61 (H) 0.61 - 1.24 mg/dL   Calcium 7.2 (L) 8.9 - 10.3 mg/dL   GFR, Estimated 31 (L) >60 mL/min   Anion gap 12 5 - 15  Beta-hydroxybutyric acid     Status: Abnormal   Collection Time: 01/14/23  7:50 PM  Result Value Ref Range   Beta-Hydroxybutyric Acid 1.44 (H) 0.05 - 0.27 mmol/L  Glucose, capillary     Status: Abnormal   Collection Time: 01/14/23  8:52 PM  Result Value Ref Range   Glucose-Capillary 205 (H) 70 - 99 mg/dL  Blood gas, venous     Status: Abnormal   Collection Time: 01/14/23  9:41 PM  Result Value Ref Range   pH, Ven 7.36 7.25 - 7.43   pCO2, Ven 37 (L) 44 - 60 mmHg   pO2, Ven 58 (H) 32 - 45 mmHg   Bicarbonate 20.9 20.0 - 28.0 mmol/L    Acid-base deficit 4.0 (H) 0.0 - 2.0 mmol/L   O2 Saturation 88 %   Patient temperature 37.0    Collection site VEIN   Glucose, capillary     Status: Abnormal   Collection Time: 01/14/23 10:29 PM  Result Value Ref Range   Glucose-Capillary 195 (H) 70 - 99 mg/dL  Glucose, capillary     Status: Abnormal   Collection Time: 01/14/23 11:22 PM  Result Value Ref Range   Glucose-Capillary 153 (H) 70 - 99 mg/dL  Basic metabolic panel     Status: Abnormal   Collection Time: 01/14/23 11:30 PM  Result Value Ref Range   Sodium 132 (L) 135 - 145 mmol/L   Potassium 3.9 3.5 - 5.1 mmol/L   Chloride 101 98 - 111 mmol/L   CO2 21 (L) 22 - 32 mmol/L   Glucose, Bld 152 (H) 70 - 99 mg/dL   BUN 48 (H) 8 - 23 mg/dL   Creatinine, Ser 0.96 (H) 0.61 - 1.24 mg/dL   Calcium 7.3 (L) 8.9 - 10.3 mg/dL   GFR, Estimated 34 (L) >60 mL/min   Anion gap 10 5 - 15  Beta-hydroxybutyric acid     Status: Abnormal   Collection Time: 01/14/23 11:30 PM  Result Value Ref Range   Beta-Hydroxybutyric Acid 0.44 (H) 0.05 - 0.27 mmol/L  Albumin     Status: Abnormal   Collection Time: 01/14/23 11:30 PM  Result Value Ref Range   Albumin 2.2 (L) 3.5 - 5.0 g/dL  Glucose, capillary     Status: Abnormal   Collection Time: 01/15/23  3:55 AM  Result Value Ref Range   Glucose-Capillary 115 (H) 70 - 99 mg/dL  TSH     Status: Abnormal   Collection Time: 01/15/23  4:07 AM  Result Value Ref Range   TSH 7.715 (H) 0.350 - 4.500 uIU/mL  Basic metabolic panel     Status: Abnormal   Collection Time: 01/15/23  4:07 AM  Result Value Ref Range   Sodium 134 (L) 135 - 145 mmol/L   Potassium 3.8 3.5 - 5.1 mmol/L   Chloride 105 98 - 111 mmol/L   CO2 20 (L) 22 - 32 mmol/L   Glucose, Bld 124 (H) 70 - 99 mg/dL   BUN 46 (H) 8 - 23 mg/dL   Creatinine, Ser 0.45 (  H) 0.61 - 1.24 mg/dL   Calcium 7.5 (L) 8.9 - 10.3 mg/dL   GFR, Estimated 35 (L) >60 mL/min   Anion gap 9 5 - 15  Protime-INR     Status: Abnormal   Collection Time: 01/15/23  4:07 AM   Result Value Ref Range   Prothrombin Time 22.4 (H) 11.4 - 15.2 seconds   INR 2.0 (H) 0.8 - 1.2  CBC     Status: Abnormal   Collection Time: 01/15/23  4:07 AM  Result Value Ref Range   WBC 2.4 (L) 4.0 - 10.5 K/uL   RBC 3.13 (L) 4.22 - 5.81 MIL/uL   Hemoglobin 9.5 (L) 13.0 - 17.0 g/dL   HCT 16.128.4 (L) 09.639.0 - 04.552.0 %   MCV 90.7 80.0 - 100.0 fL   MCH 30.4 26.0 - 34.0 pg   MCHC 33.5 30.0 - 36.0 g/dL   RDW 40.914.6 81.111.5 - 91.415.5 %   Platelets 85 (L) 150 - 400 K/uL   nRBC 0.8 (H) 0.0 - 0.2 %  Gastrointestinal Panel by PCR , Stool     Status: None   Collection Time: 01/15/23  6:50 AM   Specimen: Stool  Result Value Ref Range   Campylobacter species NOT DETECTED NOT DETECTED   Plesimonas shigelloides NOT DETECTED NOT DETECTED   Salmonella species NOT DETECTED NOT DETECTED   Yersinia enterocolitica NOT DETECTED NOT DETECTED   Vibrio species NOT DETECTED NOT DETECTED   Vibrio cholerae NOT DETECTED NOT DETECTED   Enteroaggregative E coli (EAEC) NOT DETECTED NOT DETECTED   Enteropathogenic E coli (EPEC) NOT DETECTED NOT DETECTED   Enterotoxigenic E coli (ETEC) NOT DETECTED NOT DETECTED   Shiga like toxin producing E coli (STEC) NOT DETECTED NOT DETECTED   Shigella/Enteroinvasive E coli (EIEC) NOT DETECTED NOT DETECTED   Cryptosporidium NOT DETECTED NOT DETECTED   Cyclospora cayetanensis NOT DETECTED NOT DETECTED   Entamoeba histolytica NOT DETECTED NOT DETECTED   Giardia lamblia NOT DETECTED NOT DETECTED   Adenovirus F40/41 NOT DETECTED NOT DETECTED   Astrovirus NOT DETECTED NOT DETECTED   Norovirus GI/GII NOT DETECTED NOT DETECTED   Rotavirus A NOT DETECTED NOT DETECTED   Sapovirus (I, II, IV, and V) NOT DETECTED NOT DETECTED  Glucose, capillary     Status: None   Collection Time: 01/15/23  7:45 AM  Result Value Ref Range   Glucose-Capillary 89 70 - 99 mg/dL  Basic metabolic panel     Status: Abnormal   Collection Time: 01/15/23  9:20 AM  Result Value Ref Range   Sodium 135 135 - 145  mmol/L   Potassium 3.6 3.5 - 5.1 mmol/L   Chloride 106 98 - 111 mmol/L   CO2 20 (L) 22 - 32 mmol/L   Glucose, Bld 93 70 - 99 mg/dL   BUN 42 (H) 8 - 23 mg/dL   Creatinine, Ser 7.821.75 (H) 0.61 - 1.24 mg/dL   Calcium 7.6 (L) 8.9 - 10.3 mg/dL   GFR, Estimated 40 (L) >60 mL/min   Anion gap 9 5 - 15  Beta-hydroxybutyric acid     Status: Abnormal   Collection Time: 01/15/23  9:20 AM  Result Value Ref Range   Beta-Hydroxybutyric Acid 0.92 (H) 0.05 - 0.27 mmol/L   *Note: Due to a large number of results and/or encounters for the requested time period, some results have not been displayed. A complete set of results can be found in Results Review.     Recent Labs    01/13/23 0422 01/14/23  0536 01/15/23 0407  WBC 2.3* 2.9* 2.4*  HGB 10.2* 10.0* 9.5*  HCT 31.3* 31.0* 28.4*  PLT 62* 80* 85*   BMET Recent Labs    01/14/23 2330 01/15/23 0407 01/15/23 0920  NA 132* 134* 135  K 3.9 3.8 3.6  CL 101 105 106  CO2 21* 20* 20*  GLUCOSE 152* 124* 93  BUN 48* 46* 42*  CREATININE 2.02* 1.99* 1.75*  CALCIUM 7.3* 7.5* 7.6*   LFT Recent Labs    01/12/23 1828 01/14/23 0536 01/14/23 2330  PROT 5.8* 5.1*  --   ALBUMIN 2.9* 2.6* 2.2*  AST 20 17  --   ALT 8 8  --   ALKPHOS 82 77  --   BILITOT 0.9 1.4*  --   BILIDIR 0.1  --   --   IBILI 0.8  --   --    PT/INR Recent Labs    01/14/23 0536 01/15/23 0407  LABPROT 31.8* 22.4*  INR 3.1* 2.0*   Scheduled Inpatient Medications:    Chlorhexidine Gluconate Cloth  6 each Topical Daily   ferrous sulfate  325 mg Oral Q lunch   insulin aspart  0-20 Units Subcutaneous Q4H   [START ON 01/16/2023] levothyroxine  50 mcg Oral QAC breakfast   midodrine  10 mg Oral TID WC   multivitamin with minerals  1 tablet Oral Daily   pantoprazole (PROTONIX) IV  40 mg Intravenous Q12H   pravastatin  40 mg Oral QHS   sodium bicarbonate  1,300 mg Oral BID    Continuous Inpatient Infusions:    azithromycin Stopped (01/14/23 2054)   cefTRIAXone (ROCEPHIN)  IV  Stopped (01/15/23 0959)   lactated ringers 100 mL/hr at 01/15/23 1000   linezolid (ZYVOX) IV 300 mL/hr at 01/15/23 1000    PRN Inpatient Medications:  acetaminophen **OR** acetaminophen, dextrose, hydrALAZINE, ondansetron **OR** ondansetron (ZOFRAN) IV, mouth rinse  Assessment/Plan:  Dysphagia: GERD/Regurgitation complaints:  - Barium study: completed this afternoon and pending results.    -Protonix 40 mg twice a day was ordered.  He has no heartburn or reflux symptoms. - - No further nausea or vomiting.   # Diarrhea: - Reports a good sized loose/soft formed stool today. No abdominal pain or cramps. No hematochezia   - Stool PCR sent and returns negative  -Supportive care per primary team.  Portions of the record may have been created with voice recognition software. Occasional wrong-word or 'sound-a-like' substitutions may have occurred due to the inherent limitations of voice recognition software.  Read the chart carefully and recognize, using context, where substitutions may have occurred.   Rowan Blase, ANP @[facility] @, 1:28 PM

## 2023-01-15 NOTE — Progress Notes (Signed)
Patient transferred to radiology for esophagram with cardiac monitoring and in bed.

## 2023-01-15 NOTE — Progress Notes (Signed)
Report given to receiving nurse on 2A, patient transferred with all belongings, on cardiac monitoring, in bed.

## 2023-01-15 NOTE — Assessment & Plan Note (Signed)
With CKD 3B Baseline serum creatinine is 1.84-2.04/eGFR 34-38 Renal functions slowly improving -Monitor renal function -Avoid nephrotoxins

## 2023-01-15 NOTE — Inpatient Diabetes Management (Signed)
Inpatient Diabetes Program Recommendations  AACE/ADA: New Consensus Statement on Inpatient Glycemic Control (2015)  Target Ranges:  Prepandial:   less than 140 mg/dL      Peak postprandial:   less than 180 mg/dL (1-2 hours)      Critically ill patients:  140 - 180 mg/dL   Lab Results  Component Value Date   GLUCAP 89 01/15/2023   HGBA1C 6.4 12/02/2022    Diabetes history: DM1(does not make insulin.  Needs correction, basal and meal coverage)  Outpatient Diabetes medications:  Metronic Insulin Pump  Basal rates  Midnight = 0.675  6 AM = 0.8  12 PM = 0.75  9 PM = 0.775  TDD basal: 17.925 units   Bolus settings  I/C: 9  ISF: 60  Target Glucose: 100-130  Active insulin time: 3 hours   Current DM orders Novolog 0-20 units Q4H   Inpatient Diabetes Program Recommendations:    Insulin pump was removed at some point.  BHB was elevated at 1.44 on 4/4 at 19:50.  Now on resistant scale Q4H.    Please consider:  Semglee 12 units QD Novolog 0-9 units Q4H while NPO  Will continue to follow while inpatient.  Thank you, Dulce Sellar, MSN, CDCES Diabetes Coordinator Inpatient Diabetes Program [phone] (team pager from 8a-5p)

## 2023-01-15 NOTE — Assessment & Plan Note (Signed)
CBG today within goal.  There was concern of DKA with high anion gap metabolic acidosis with hyperglycemia yesterday. -Continue with SSI

## 2023-01-16 DIAGNOSIS — A419 Sepsis, unspecified organism: Principal | ICD-10-CM

## 2023-01-16 DIAGNOSIS — L0390 Cellulitis, unspecified: Secondary | ICD-10-CM | POA: Diagnosis not present

## 2023-01-16 DIAGNOSIS — E039 Hypothyroidism, unspecified: Secondary | ICD-10-CM | POA: Diagnosis not present

## 2023-01-16 DIAGNOSIS — C8318 Mantle cell lymphoma, lymph nodes of multiple sites: Secondary | ICD-10-CM

## 2023-01-16 DIAGNOSIS — I10 Essential (primary) hypertension: Secondary | ICD-10-CM | POA: Diagnosis not present

## 2023-01-16 DIAGNOSIS — E1151 Type 2 diabetes mellitus with diabetic peripheral angiopathy without gangrene: Secondary | ICD-10-CM

## 2023-01-16 DIAGNOSIS — N179 Acute kidney failure, unspecified: Secondary | ICD-10-CM | POA: Diagnosis not present

## 2023-01-16 DIAGNOSIS — K219 Gastro-esophageal reflux disease without esophagitis: Secondary | ICD-10-CM

## 2023-01-16 LAB — RENAL FUNCTION PANEL
Albumin: 2.2 g/dL — ABNORMAL LOW (ref 3.5–5.0)
Anion gap: 9 (ref 5–15)
BUN: 32 mg/dL — ABNORMAL HIGH (ref 8–23)
CO2: 23 mmol/L (ref 22–32)
Calcium: 7.6 mg/dL — ABNORMAL LOW (ref 8.9–10.3)
Chloride: 104 mmol/L (ref 98–111)
Creatinine, Ser: 1.4 mg/dL — ABNORMAL HIGH (ref 0.61–1.24)
GFR, Estimated: 53 mL/min — ABNORMAL LOW (ref >60)
Glucose, Bld: 106 mg/dL — ABNORMAL HIGH (ref 70–99)
Phosphorus: 2.2 mg/dL — ABNORMAL LOW (ref 2.5–4.6)
Potassium: 3.6 mmol/L (ref 3.5–5.1)
Sodium: 136 mmol/L (ref 135–145)

## 2023-01-16 LAB — GLUCOSE, CAPILLARY
Glucose-Capillary: 114 mg/dL — ABNORMAL HIGH (ref 70–99)
Glucose-Capillary: 122 mg/dL — ABNORMAL HIGH (ref 70–99)
Glucose-Capillary: 164 mg/dL — ABNORMAL HIGH (ref 70–99)
Glucose-Capillary: 180 mg/dL — ABNORMAL HIGH (ref 70–99)
Glucose-Capillary: 206 mg/dL — ABNORMAL HIGH (ref 70–99)
Glucose-Capillary: 93 mg/dL (ref 70–99)
Glucose-Capillary: 98 mg/dL (ref 70–99)

## 2023-01-16 MED ORDER — INSULIN ASPART 100 UNIT/ML IJ SOLN
0.0000 [IU] | Freq: Three times a day (TID) | INTRAMUSCULAR | Status: DC
  Administered 2023-01-17 (×2): 2 [IU] via SUBCUTANEOUS
  Administered 2023-01-18 – 2023-01-19 (×4): 3 [IU] via SUBCUTANEOUS
  Administered 2023-01-20: 2 [IU] via SUBCUTANEOUS
  Administered 2023-01-21 – 2023-01-23 (×6): 3 [IU] via SUBCUTANEOUS
  Administered 2023-01-24 (×3): 5 [IU] via SUBCUTANEOUS
  Administered 2023-01-25 (×3): 3 [IU] via SUBCUTANEOUS
  Filled 2023-01-16 (×19): qty 1

## 2023-01-16 MED ORDER — INSULIN ASPART 100 UNIT/ML IJ SOLN
0.0000 [IU] | Freq: Every day | INTRAMUSCULAR | Status: DC
  Filled 2023-01-16: qty 1

## 2023-01-16 MED ORDER — PANTOPRAZOLE SODIUM 40 MG PO TBEC
40.0000 mg | DELAYED_RELEASE_TABLET | Freq: Two times a day (BID) | ORAL | Status: DC
  Administered 2023-01-16 – 2023-01-25 (×19): 40 mg via ORAL
  Filled 2023-01-16 (×19): qty 1

## 2023-01-16 MED ORDER — INSULIN GLARGINE-YFGN 100 UNIT/ML ~~LOC~~ SOLN
10.0000 [IU] | Freq: Every day | SUBCUTANEOUS | Status: DC
  Administered 2023-01-16 – 2023-01-19 (×4): 10 [IU] via SUBCUTANEOUS
  Filled 2023-01-16 (×5): qty 0.1

## 2023-01-16 MED ORDER — INSULIN ASPART 100 UNIT/ML IJ SOLN
3.0000 [IU] | Freq: Three times a day (TID) | INTRAMUSCULAR | Status: DC
  Administered 2023-01-16 – 2023-01-20 (×9): 3 [IU] via SUBCUTANEOUS
  Filled 2023-01-16 (×10): qty 1

## 2023-01-16 NOTE — Progress Notes (Signed)
Progress Note   Patient: Gary [name] UJW:[id number] DOB: 26-Apr-1946 DOA: 01/12/2023     4 DOS: the patient was seen and examined on 01/16/2023   Brief hospital course: [name] is a 77 year old male with history of recurrent stage III mantle cell lymphoma who has completed 43 cycles of Rituxan and Treanda, depression, insulin-dependent diabetes mellitus, hypertension, hyperlipidemia, who presents to the emergency department for chief concerns of generalized weakness and hypotension.  Patient is reported to have nausea and vomiting and diarrhea and this has been ongoing for the last several months  Vitals in the ED showed temperature of 98, respiration rate of 16, heart rate of 108, blood pressure 88/60, SpO2 of 99% on room air.  Serum sodium is 134, potassium 3.5, chloride 101, bicarb 19, BUN of 37, serum creatinine of 2.46, EGFR of 27, nonfasting blood glucose 70, WBC 3.3, hemoglobin 11.3, platelets of 92.  High sensitive troponin is 27.  Lactic acid is 2.1.  Blood cultures x 2 are in process.  ED treatment: Ceftriaxone 2 g IV one-time dose, lactated ringer 1 L bolus, vancomycin.  4/3:Labs with slight worsening of metabolic acidosis, creatinine with mild improvement to 2.43, worsening leukopenia and thrombocytopenia but all cell lines decreased-some dilutional effect.  On imaging which include CT head, cervical spine, CT abdominal and pelvis were negative for any acute abnormality.  INR at 3.5-discontinue DVT prophylaxis with heparin-SCDs ordered. Patient again becoming hypotensive-give him 1 more liter of bolus and also starting midodrine. Still no obvious source of infection as blood cultures remain negative in 24 hours. Will continue broad-spectrum antibiotics.  Concern of lower extremity cellulitis.  4/4: Blood pressure remained on softer side, midodrine dose was increased to 10 mg, 3 times daily.  GI pathogen panel negative, C. difficile negative, Blood cultures remain negative.   Labs with very slowly improving creatinine to 2.33, worsening now anion gap metabolic acidosis with bicarb at 14, mildly elevated T. bili at 1.4.  Lactic acid was normal but found to have elevated beta hydroxybutyric acid, concern of developing DKA. Rechecking BMP if Remained open we will start him on Endo tool. INR still elevated, APTT started improving.  DIC labs negative.  Vitamin K levels pending  Addendum.  Repeat BMP remained high anion gap metabolic acidosis with elevated blood glucose level.  Patient seems like in DKA.  Transferring to stepdown and starting on Endo tool.  4/5: Blood pressure remained little soft.  CBG improved and gap closed.  [name]  He was later managed with resistant sliding scale every 4 hourly.  Potassium was also repleted overnight.  A.m. labs with some improvement of creatinine to 1.99, bicarb still low at 20-infusion was discontinued overnight and he is now being restarted on p.o. bicarb supplement.  TSH found to be elevated at 7.715-Home Synthroid dose was increased to 50. Esophagogram was ordered by GI today.  4/6: Blood pressure remained on softer side.  CBG improved, converted to SSI with meals along with 10 units of Smeglee.  Esophageal gram with limited study due to patient's inability to position properly but did show patulous esophagus with dysmotility and spontaneous reflex.  Potentially GEJ narrowing.  Will need EGD after improvement in current acute illness.  GI pathogen panel negative.  Assessment and Plan: * Sepsis due to cellulitis Patient has elevated lactic acid of 2.1, leukopenia, elevated heart rate, low normal tensive, with possible source of infection, cellulitis  of the right leg vs CAP Blood cultures remain negative, procalcitonin elevated.  Lactic acidosis resolved. Improving lower extremity cellulitis. Continue ceftriaxone and Zithromax to complete a 5-day course   Continue linezolid for total of 7 days  Elevated INR INR elevated at 3.5 on admission which has been improved now.  Patient was not on Coumadin.  Was given heparin subcu for DVT prophylaxis.  Hepatic function with mildly low total protein and albumin and rest normal.  Also found to have significantly elevated APTT which started improving today.  DIC labs and peripheral smear negative.  Vitamin K levels pending.  Did receive 1 dose of IV vitamin K -Continue to monitor  Increased anion gap metabolic acidosis improving.  Insulin infusion ordered but never started and his blood glucose was managed with resistant sliding scale.  Bicarb infusion was discontinued overnight -Continue with p.o. bicarb now -Continue to monitor  AKI (acute kidney injury) With CKD 3B Baseline serum creatinine is 1.84-2.04/eGFR 34-38 Renal functions slowly improving -Monitor renal function -Avoid nephrotoxins  Essential hypertension, benign Blood pressure currently soft requiring fluid resuscitation. Started on midodrine  Hypothyroid TSH elevated at 7.7 -Increasing the dose of Synthroid to 50  Adjustment disorder with depressed mood Fluoxetine 20 mg daily resumed  Hypercholesterolemia Pravastatin 40 mg daily resumed  Mantle cell lymphoma Last chemo was in January 2024.  Being followed up by oncology as outpatient.  GERD (gastroesophageal reflux disease) Patient is having a lot of GERD issues and spitting out most of the medications. -Swallow evaluation ordered -PPI if needed   Diabetes CBG today within goal.  There was concern of DKA with high anion gap metabolic acidosis with hyperglycemia yesterday. -Continue with SSI      Subjective: Patient slowly improving.Some improvement in appetite.  Physical Exam: Vitals:   01/15/23 2344 01/16/23 0405 01/16/23 0757 01/16/23 1106  BP: 91/63 (!) 99/46 (!) 81/51 91/60  Pulse: 98 60 60 60  Resp: 20 18 17 17   Temp: 97.7 F (36.5 C) 98 F (36.7 C)  97.6 F (36.4 C) (!) 97.5 F (36.4 C)  TempSrc:   Oral Oral  SpO2: 100% 96% 100% 99%  Weight:      Height:       General. Frail elderly man, In no acute distress. Pulmonary.  Lungs clear bilaterally, normal respiratory effort. CV.  Regular rate and rhythm, no JVD, rub or murmur. Abdomen.  Soft, nontender, nondistended, BS positive. CNS.  Alert and oriented .  No focal neurologic deficit. Extremities.  LE with Ace wrap, improving erythema Psychiatry.  Judgment and insight appears normal.   Data Reviewed: Prior data reviewed.  Family Communication: Talked with sister on phone  Disposition: Status is: Inpatient Remains inpatient appropriate because: Severity of illness  Planned Discharge Destination: Home  DVT prophylaxis.  SCDs Time spent: 45 minutes  This record has been created using Conservation officer, historic buildings. Errors have been sought and corrected,but may not always be located. Such creation errors do not reflect on the standard of care.   Author: Arnetha Courser, MD 01/16/2023 2:22 PM  For on call review [email or web address].

## 2023-01-16 NOTE — Evaluation (Signed)
Occupational Therapy Evaluation Patient Details Name: Gary [name] MRN: [id number] DOB: October 19, 1945 Today's Date: 01/16/2023   History of Present Illness Gary [name] is a 77 y/o male with PMH of lymphoma, depression, DM2, HTN, COPD, HLD. Admitted with sepsis; weakness, N/V, diarrhea.   Clinical Impression   Patient received for OT evaluation. See flowsheet below for details of function. Generally, patient requiring MOD A for bed mobility, MOD A x2 sidesteps functional mobility, and MOD-MAX A for ADLs.       Recommendations for follow up therapy are one component of a multi-disciplinary discharge planning process, led by the attending physician.  Recommendations may be updated based on patient status, additional functional criteria and insurance authorization.   Assistance Recommended at Discharge Frequent or constant Supervision/Assistance  Patient can return home with the following Two people to help with walking and/or transfers;A lot of help with bathing/dressing/bathroom;Assistance with cooking/housework;Direct supervision/assist for medications management;Direct supervision/assist for financial management;Assist for transportation;Help with stairs or ramp for entrance    Functional Status Assessment  Patient has had a recent decline in their functional status and demonstrates the ability to make significant improvements in function in a reasonable and predictable amount of time.  Equipment Recommendations  Other (comment) (defer to next venue of care)    Recommendations for Other Services       Precautions / Restrictions Precautions Precautions: Fall Restrictions Weight Bearing Restrictions: No      Mobility Bed Mobility Overal bed mobility: Needs Assistance Bed Mobility: Supine to Sit, Sit to Supine     Supine to sit: Mod assist, HOB elevated Sit to supine: Mod assist, +2 for safety/equipment   General bed mobility comments: Took lots of time and great effort     Transfers Overall transfer level: Needs assistance Equipment used: Rolling walker (2 wheels) Transfers: Sit to/from Stand Sit to Stand: Mod assist, Max assist, +2 safety/equipment, From elevated surface (+2 for second person stabilizing RW while pt t/f to stand)                  Balance Overall balance assessment: Needs assistance   Sitting balance-Leahy Scale: Fair                                     ADL either performed or assessed with clinical judgement   ADL Overall ADL's : Needs assistance/impaired   Eating/Feeding Details (indicate cue type and reason): OT noted today that when pt asked for his dentures, OT handed him the case; pt opened it and all sides inside had black grime on them and approx a dozen small maggot-like bugs noted. Pt had only bottom dentures inside, which he removed and put into his mouth. When OT commented that his case was dirty, pt stated "yeah, it probably is". OT notified RN, and old case was thrown away and new case provided. Pt drank some of his orange juice and opened container without issue                 Lower Body Dressing: Maximal assistance Lower Body Dressing Details (indicate cue type and reason): Pt dependent for donning non-skid socks today; pt states he just slips on slipper shoes at baseline; does not don socks at baseline. Has BIL LE wrapped; states HHRN comes 1x/week at baseline.     Toileting- Clothing Manipulation and Hygiene: Total assistance Toileting - Clothing Manipulation Details (indicate cue type and reason): Pt  with runny bowel incontinence in the bed, which he was unaware of. In standing position (with MIN A for standing balance at RW with great pt effort) pt dependent from OT for assistance with bowel hygiene; needs new sacral wound dressing and RN notified of such.             Vision Baseline Vision/History: 1 Wears glasses Additional Comments: Pt wearing his glasses from home today during  session.     Perception     Praxis      Pertinent Vitals/Pain Pain Assessment Pain Assessment: No/denies pain     Hand Dominance     Extremity/Trunk Assessment Upper Extremity Assessment Upper Extremity Assessment: Generalized weakness   Lower Extremity Assessment Lower Extremity Assessment: Generalized weakness       Communication Communication Communication: No difficulties   Cognition Arousal/Alertness: Awake/alert Behavior During Therapy: WFL for tasks assessed/performed Overall Cognitive Status: Within Functional Limits for tasks assessed                                 General Comments: Pleasant and agreeable; slightly slowed responses, but all are appropriate and seem to track with medical record. Able to name the location, date, and who is running for president.     General Comments  Pt noted to have extreme forward flexion of the neck, especially seated at EOB and once back in the bed after mobility. After standing, pt less responsive, closing eyes, but still verbally responsive; does admit to being dizzy. No BP machine immediately available. After a few minutes BP approx 118/70 once back in supine; pt more alert. BIL UE slightly tremoring.    Exercises     Shoulder Instructions      Home Living Family/patient expects to be discharged to:: Private residence Living Arrangements: Alone Available Help at Discharge: Family;Home health;Available PRN/intermittently Type of Home: House Home Access: Ramped entrance     Home Layout: One level     Bathroom Shower/Tub: Sponge bathes at baseline   Bathroom Toilet: Handicapped height Bathroom Accessibility: Yes How Accessible: Accessible via walker Home Equipment: Rollator (4 wheels);Cane - single point;Grab bars - toilet   Additional Comments: Pt states his sister comes by 1x/week to bring groceries, take his laundry to laundrimat, and help him around the house. Pt admits that his house is not  clean. Pt states he has a large dog.      Prior Functioning/Environment Prior Level of Function : Needs assist;History of Falls (last six months)             Mobility Comments: Pt states he uses a rollator for mobility. Admits to 4 falls in the paste month; most recently last week he fell taking his dog outside. States it is difficult for him to mobilize. Sleeps in a regular recliner (states it is a large recliner because his dog sleeps with him). ADLs Comments: Pt states he sponge bathes standing at the sink holding onto the grab bar at baseline. Notes that ADLs are difficult. Has slip on shoes that he uses at baseline. He does not drive; uses [employer] bus when he needs to go to appointments; not on O2 at home.        OT Problem List: Decreased strength;Decreased activity tolerance;Impaired balance (sitting and/or standing)      OT Treatment/Interventions: Self-care/ADL training;Therapeutic exercise;Therapeutic activities    OT Goals(Current goals can be found in the care plan section) Acute Rehab  OT Goals Patient Stated Goal: Get better OT Goal Formulation: With patient Time For Goal Achievement: 01/30/23 Potential to Achieve Goals: Fair ADL Goals Pt Will Perform Lower Body Bathing: with modified independence;sit to/from stand Pt Will Perform Lower Body Dressing: with modified independence;sit to/from stand Pt Will Transfer to Toilet: with modified independence;bedside commode Pt Will Perform Toileting - Clothing Manipulation and hygiene: with modified independence;sit to/from stand  OT Frequency: Min 1X/week    Co-evaluation PT/OT/SLP Co-Evaluation/Treatment: Yes Reason for Co-Treatment: Complexity of the patient's impairments (multi-system involvement);For patient/therapist safety   OT goals addressed during session: ADL's and self-care;Strengthening/ROM      AM-PAC OT "6 Clicks" Daily Activity     Outcome Measure Help from another person eating meals?: None Help  from another person taking care of personal grooming?: A Little Help from another person toileting, which includes using toliet, bedpan, or urinal?: Total Help from another person bathing (including washing, rinsing, drying)?: A Lot Help from another person to put on and taking off regular upper body clothing?: A Little Help from another person to put on and taking off regular lower body clothing?: Total 6 Click Score: 14   End of Session Equipment Utilized During Treatment: Rolling walker (2 wheels) Nurse Communication: Mobility status;Other (comment) (need for further bowel hygiene)  Activity Tolerance: Patient limited by fatigue Patient left: in bed;with call bell/phone within reach;with bed alarm set  OT Visit Diagnosis: Unsteadiness on feet (R26.81);Repeated falls (R29.6)                Time: 4445-8483 OT Time Calculation (min): 37 min Charges:  OT General Charges $OT Visit: 1 Visit OT Evaluation $OT Eval Moderate Complexity: 1 Mod OT Treatments $Self Care/Home Management : 8-22 mins  Linward Foster, MS, OTR/L  Alvester Morin 01/16/2023, 4:24 PM

## 2023-01-16 NOTE — Evaluation (Signed)
Physical Therapy Evaluation Patient Details Name: Gary [name] MRN: [id number] DOB: 03/01/1946 Today's Date: 01/16/2023  History of Present Illness  Gary [name] is a 77 y/o male with PMH of lymphoma, depression, DM2, HTN, COPD, HLD. Admitted with sepsis; weakness, N/V, diarrhea.  Clinical Impression  Pt pleasant and motivated but very weak and functionally limited t/o exam.  Pt struggled with all aspects of mobility and needed assist with all activity, he struggled getting to/maintaining standing and needed constant assist to keep weight forward and heavy UE reliance.  Pt showed good effort but tolerated only a few minutes of static standing EOB (and a few small side steps) before needing to sit rather abruptly citing significant fatigue.  Pt reports that a month ago he was able to move reasonably well in the home with a walker but has been weaker and weaker recently.  Pt is not safe to return home at this time and is far from his ostensible baseline.    Recommendations for follow up therapy are one component of a multi-disciplinary discharge planning process, led by the attending physician.  Recommendations may be updated based on patient status, additional functional criteria and insurance authorization.  Follow Up Recommendations Can patient physically be transported by private vehicle: No     Assistance Recommended at Discharge Frequent or constant Supervision/Assistance  Patient can return home with the following  Two people to help with walking and/or transfers;A lot of help with bathing/dressing/bathroom;Assistance with cooking/housework;Assist for transportation;Help with stairs or ramp for entrance    Equipment Recommendations  (TBD at next venue of care)  Recommendations for Other Services       Functional Status Assessment Patient has had a recent decline in their functional status and demonstrates the ability to make significant improvements in function in a reasonable and  predictable amount of time.     Precautions / Restrictions Precautions Precautions: Fall Restrictions Weight Bearing Restrictions: No      Mobility  Bed Mobility Overal bed mobility: Needs Assistance Bed Mobility: Supine to Sit, Sit to Supine     Supine to sit: Mod assist, HOB elevated Sit to supine: Mod assist   General bed mobility comments: Extra time and effort, did not feel strong enough to even try getting back into bed w/o assist    Transfers Overall transfer level: Needs assistance Equipment used: Rolling walker (2 wheels) Transfers: Sit to/from Stand Sit to Stand: Mod assist, Max assist, +2 safety/equipment, From elevated surface           General transfer comment: heavy assist with plenty of cuing, encouragement, set up.  Able to tolerate a few minutes of standing while therapists assisted with BM clean up.  Heavily UE reliance and quick to fatigue.    Ambulation/Gait     Assistive device: Rolling walker (2 wheels)         General Gait Details: no true ambulation, but able to take a few labored/effortful side steps/shuffles along EOB.  After 1-82ft he was completely fatigued and requested to sit.  Stairs            Wheelchair Mobility    Modified Rankin (Stroke Patients Only)       Balance Overall balance assessment: Needs assistance   Sitting balance-Leahy Scale: Fair     Standing balance support: Bilateral upper extremity supported Standing balance-Leahy Scale: Poor Standing balance comment: Pt needing almost minA to keep weight forward, leaning back of legs on bed when PT tried to decrease tactile/phyisical assist  Pertinent Vitals/Pain Pain Assessment Pain Assessment: Faces Faces Pain Scale: Hurts little more Pain Location: palpation of LEs during mobility/LE strength testing    Home Living Family/patient expects to be discharged to:: Private residence Living Arrangements:  Alone Available Help at Discharge: Family;Home health;Available PRN/intermittently Type of Home: House Home Access: Ramped entrance       Home Layout: One level Home Equipment: Rollator (4 wheels);Cane - single point;Grab bars - toilet Additional Comments: Pt states his sister comes by 1x/week to bring groceries, take his laundry to laundrimat, and help him around the house. Pt admits that his house is not clean. Pt states he has a large dog.    Prior Function Prior Level of Function : Needs assist;History of Falls (last six months)             Mobility Comments: Per OT: Pt states he uses a rollator for mobility. Admits to 4 falls in the past month; most recently last week taking his dog outside. States increased difficultywith mobilization recently. Sleeps (with dog) in a large recliner ADLs Comments: Per OT: Pt states he sponge bathes standing at the sink holding onto the grab bar at baseline. Notes that ADLs are difficult. Has slip on shoes that he uses at baseline. He does not drive; uses [employer] when he needs to go to appointments; not on O2 at home.     Hand Dominance        Extremity/Trunk Assessment   Upper Extremity Assessment Upper Extremity Assessment: Generalized weakness    Lower Extremity Assessment Lower Extremity Assessment: Generalized weakness    Cervical / Trunk Assessment Cervical / Trunk Assessment: Kyphotic (chin nearly to chest and limited ability to protract)  Communication   Communication: No difficulties  Cognition Arousal/Alertness: Awake/alert Behavior During Therapy: WFL for tasks assessed/performed Overall Cognitive Status: Within Functional Limits for tasks assessed                                 General Comments: Pleasant and agreeable; slightly slowed responses, but all are appropriate and seem to track with medical record. Able to name the location, date, and who is running for president.        General  Comments General comments (skin integrity, edema, etc.): Pt noted to have extreme forward flexion of the neck, especially seated at EOB and once back in the bed after mobility. After standing, pt less responsive, closing eyes, but still verbally responsive; does admit to being dizzy. No BP machine immediately available. After a few minutes BP approx 118/70 once back in supine; pt more alert. BIL UE slightly tremoring.    Exercises     Assessment/Plan    PT Assessment Patient needs continued PT services  PT Problem List Decreased strength;Decreased range of motion;Decreased balance;Decreased activity tolerance;Decreased mobility;Decreased coordination;Decreased knowledge of use of DME;Decreased safety awareness       PT Treatment Interventions Gait training;Therapeutic activities;Therapeutic exercise    PT Goals (Current goals can be found in the Care Plan section)  Acute Rehab PT Goals Patient Stated Goal: get strength back PT Goal Formulation: With patient Time For Goal Achievement: 01/29/23 Potential to Achieve Goals: Fair    Frequency Min 3X/week     Co-evaluation PT/OT/SLP Co-Evaluation/Treatment: Yes Reason for Co-Treatment: Complexity of the patient's impairments (multi-system involvement);For patient/therapist safety PT goals addressed during session: Strengthening/ROM;Proper use of DME;Balance;Mobility/safety with mobility OT goals addressed during session: ADL's and self-care;Strengthening/ROM  AM-PAC PT "6 Clicks" Mobility  Outcome Measure Help needed turning from your back to your side while in a flat bed without using bedrails?: A Lot Help needed moving from lying on your back to sitting on the side of a flat bed without using bedrails?: A Lot Help needed moving to and from a bed to a chair (including a wheelchair)?: Total Help needed standing up from a chair using your arms (e.g., wheelchair or bedside chair)?: A Lot Help needed to walk in hospital room?:  Total Help needed climbing 3-5 steps with a railing? : Total 6 Click Score: 9    End of Session   Activity Tolerance: Patient limited by fatigue Patient left: with bed alarm set;with call bell/phone within reach Nurse Communication: Mobility status PT Visit Diagnosis: Muscle weakness (generalized) (M62.81);Difficulty in walking, not elsewhere classified (R26.2);Unsteadiness on feet (R26.81)    Time: 1540-1600 PT Time Calculation (min) (ACUTE ONLY): 20 min   Charges:   PT Evaluation $PT Eval Moderate Complexity: 1 Mod          Malachi Pro, DPT 01/16/2023, 5:25 PM

## 2023-01-17 ENCOUNTER — Encounter: Payer: Self-pay | Admitting: Internal Medicine

## 2023-01-17 DIAGNOSIS — L0390 Cellulitis, unspecified: Secondary | ICD-10-CM | POA: Diagnosis not present

## 2023-01-17 DIAGNOSIS — E039 Hypothyroidism, unspecified: Secondary | ICD-10-CM | POA: Diagnosis not present

## 2023-01-17 DIAGNOSIS — I10 Essential (primary) hypertension: Secondary | ICD-10-CM | POA: Diagnosis not present

## 2023-01-17 DIAGNOSIS — N179 Acute kidney failure, unspecified: Secondary | ICD-10-CM | POA: Diagnosis not present

## 2023-01-17 LAB — CULTURE, BLOOD (ROUTINE X 2)
Culture: NO GROWTH
Culture: NO GROWTH
Special Requests: ADEQUATE
Special Requests: ADEQUATE

## 2023-01-17 LAB — GLUCOSE, CAPILLARY
Glucose-Capillary: 130 mg/dL — ABNORMAL HIGH (ref 70–99)
Glucose-Capillary: 126 mg/dL — ABNORMAL HIGH (ref 70–99)
Glucose-Capillary: 125 mg/dL — ABNORMAL HIGH (ref 70–99)
Glucose-Capillary: 61 mg/dL — ABNORMAL LOW (ref 70–99)
Glucose-Capillary: 63 mg/dL — ABNORMAL LOW (ref 70–99)
Glucose-Capillary: 100 mg/dL — ABNORMAL HIGH (ref 70–99)
Glucose-Capillary: 156 mg/dL — ABNORMAL HIGH (ref 70–99)

## 2023-01-17 NOTE — Assessment & Plan Note (Signed)
Has been evaluated by Dr Singh.  Avoid antiinflammatories.  Follow metabolic panel.  

## 2023-01-17 NOTE — Assessment & Plan Note (Signed)
Stable.  Continue prozac.  

## 2023-01-17 NOTE — Assessment & Plan Note (Signed)
Seeing Dr O'Connell.  Insulin pump.  Not having the increased low sugars as he was previously.  Follow   

## 2023-01-17 NOTE — TOC Progression Note (Signed)
Transition of Care [facility]) - Progression Note    Patient Details  Name: Gary [name] MRN: [id number] Date of Birth: 07-06-1946  [facility] CM/SW Contact  Bing Quarry, RN Phone Number: 01/17/2023, 3:21 PM  Clinical Narrative:  4/7: Spoke with patient regarding PT therapy recommendations for STR options/choices after introducing CM role in discharge planning. Agreeable to STR/SNF and was discharge to [facility] in December 2022 and is first preference now. Granted verbal permission to also do local bed search in the area and to speak to sister who was unavailable when RN CM attempted to contact. Patient is very weak but was able to discuss options. Bed search began with prior [facility] # [id number] A. Gabriel Cirri RN CM   [facility] is first choice and wishes to stay locally to [facility] Gabriel Cirri RN CM       Expected Discharge Plan:  (Pending Medical Workup) Barriers to Discharge: No Barriers Identified  Expected Discharge Plan and Services   Discharge Planning Services: CM Consult   Living arrangements for the past 2 months: Single Family Home                   DME Agency:  (Patient has a rolling walker and a single point cane at home.)                   Social Determinants of Health (SDOH) Interventions SDOH Screenings   Food Insecurity: No Food Insecurity (01/12/2023)  Housing: Low Risk  (01/12/2023)  Transportation Needs: No Transportation Needs (01/12/2023)  Recent Concern: Transportation Needs - Unmet Transportation Needs (01/11/2023)  Utilities: Not At Risk (01/12/2023)  Alcohol Screen: Low Risk  (11/27/2020)  Depression (PHQ2-9): High Risk (01/12/2023)  Financial Resource Strain: Low Risk  (03/10/2022)  Physical Activity: Inactive (03/10/2022)  Social Connections: Socially Isolated (03/10/2022)  Stress: No Stress Concern Present (03/10/2022)  Tobacco Use: High Risk (01/17/2023)    Readmission Risk Interventions    01/15/2023   11:37 AM 08/30/2021   10:48 AM   Readmission Risk Prevention Plan  Transportation Screening Complete Complete  PCP or Specialist Appt within 3-5 Days Complete Complete  HRI or Home Care Consult Complete Complete  Social Work Consult for Recovery Care Planning/Counseling Complete Complete  Palliative Care Screening Not Applicable Not Applicable  Medication Review Oceanographer) Complete Complete

## 2023-01-17 NOTE — Assessment & Plan Note (Signed)
Taking lasix. Lower extremity swelling improved overall with wraps.  Wraps in place.  Now with orthostatic hypotension.  ER/hospital evaluation/w/up as outlined.  Lasix dosing may need to be adjusted until can sort through etiology of weakness/hypotension, etc.

## 2023-01-17 NOTE — Progress Notes (Signed)
Progress Note   Patient: Gary [name] ULA:[id number] DOB: Jul 12, 1946 DOA: 01/12/2023     5 DOS: the patient was seen and examined on 01/17/2023   Brief hospital course: [name] is a 77 year old male with history of recurrent stage III mantle cell lymphoma who has completed 43 cycles of Rituxan and Treanda, depression, insulin-dependent diabetes mellitus, hypertension, hyperlipidemia, who presents to the emergency department for chief concerns of generalized weakness and hypotension.  Patient is reported to have nausea and vomiting and diarrhea and this has been ongoing for the last several months  Vitals in the ED showed temperature of 98, respiration rate of 16, heart rate of 108, blood pressure 88/60, SpO2 of 99% on room air.  Serum sodium is 134, potassium 3.5, chloride 101, bicarb 19, BUN of 37, serum creatinine of 2.46, EGFR of 27, nonfasting blood glucose 70, WBC 3.3, hemoglobin 11.3, platelets of 92.  High sensitive troponin is 27.  Lactic acid is 2.1.  Blood cultures x 2 are in process.  ED treatment: Ceftriaxone 2 g IV one-time dose, lactated ringer 1 L bolus, vancomycin.  4/3:Labs with slight worsening of metabolic acidosis, creatinine with mild improvement to 2.43, worsening leukopenia and thrombocytopenia but all cell lines decreased-some dilutional effect.  On imaging which include CT head, cervical spine, CT abdominal and pelvis were negative for any acute abnormality.  INR at 3.5-discontinue DVT prophylaxis with heparin-SCDs ordered. Patient again becoming hypotensive-give him 1 more liter of bolus and also starting midodrine. Still no obvious source of infection as blood cultures remain negative in 24 hours. Will continue broad-spectrum antibiotics.  Concern of lower extremity cellulitis.  4/4: Blood pressure remained on softer side, midodrine dose was increased to 10 mg, 3 times daily.  GI pathogen panel negative, C. difficile negative, Blood cultures remain negative.   Labs with very slowly improving creatinine to 2.33, worsening now anion gap metabolic acidosis with bicarb at 14, mildly elevated T. bili at 1.4.  Lactic acid was normal but found to have elevated beta hydroxybutyric acid, concern of developing DKA. Rechecking BMP if Remained open we will start him on Endo tool. INR still elevated, APTT started improving.  DIC labs negative.  Vitamin K levels pending  Addendum.  Repeat BMP remained high anion gap metabolic acidosis with elevated blood glucose level.  Patient seems like in DKA.  Transferring to stepdown and starting on Endo tool.  4/5: Blood pressure remained little soft.  CBG improved and gap closed.  [name]  He was later managed with resistant sliding scale every 4 hourly.  Potassium was also repleted overnight.  A.m. labs with some improvement of creatinine to 1.99, bicarb still low at 20-infusion was discontinued overnight and he is now being restarted on p.o. bicarb supplement.  TSH found to be elevated at 7.715-Home Synthroid dose was increased to 50. Esophagogram was ordered by GI today.  4/6: Blood pressure remained on softer side.  CBG improved, converted to SSI with meals along with 10 units of Smeglee.  Esophageal gram with limited study due to patient's inability to position properly but did show patulous esophagus with dysmotility and spontaneous reflex.  Potentially GEJ narrowing.  Will need EGD after improvement in current acute illness.  GI pathogen panel negative.  4/7: Patient slowly improving.  GI planning EGD tomorrow with Dr. [name].  Awaiting SNF placement.  Assessment and Plan: * Sepsis due to cellulitis Patient has elevated  lactic acid of 2.1, leukopenia, elevated heart rate, low normal tensive, with possible source of infection, cellulitis of the right leg vs CAP Blood cultures remain negative, procalcitonin elevated.  Lactic acidosis  resolved. Improving lower extremity cellulitis. Continue ceftriaxone and Zithromax to complete a 5-day course  Continue linezolid for total of 7 days  Elevated INR INR elevated at 3.5 on admission which has been improved now.  Patient was not on Coumadin.  Was given heparin subcu for DVT prophylaxis.  Hepatic function with mildly low total protein and albumin and rest normal.  Also found to have significantly elevated APTT which started improving today.  DIC labs and peripheral smear negative.  Vitamin K levels pending.  Did receive 1 dose of IV vitamin K -Continue to monitor  Increased anion gap metabolic acidosis improving.  Insulin infusion ordered but never started and his blood glucose was managed with resistant sliding scale.  Bicarb infusion was discontinued overnight -Continue with p.o. bicarb now -Continue to monitor  AKI (acute kidney injury) With CKD 3B Baseline serum creatinine is 1.84-2.04/eGFR 34-38 Renal functions slowly improving -Monitor renal function -Avoid nephrotoxins  Essential hypertension, benign Blood pressure currently soft requiring fluid resuscitation. Started on midodrine  Hypothyroid TSH elevated at 7.7 -Increasing the dose of Synthroid to 50  Adjustment disorder with depressed mood Fluoxetine 20 mg daily resumed  Hypercholesterolemia Pravastatin 40 mg daily resumed  Mantle cell lymphoma Last chemo was in January 2024.  Being followed up by oncology as outpatient.  GERD (gastroesophageal reflux disease) Patient is having a lot of GERD issues and spitting out most of the medications. -GI was consulted for abnormal esophageal gram.  EGD tomorrow -PPI    Diabetes CBG today within goal.  There was concern of DKA with high anion gap metabolic acidosis with hyperglycemia yesterday. -Continue with SSI      Subjective: Patient was seen and examined today.  Slowly improving appetite and no new concerns today.  Physical Exam: Vitals:    01/16/23 2335 01/17/23 0400 01/17/23 0719 01/17/23 1138  BP: 106/65 104/60 (!) 101/51 (!) 103/50  Pulse: (!) 59 61 (!) 59 65  Resp: 18 20 18    Temp: 98.5 F (36.9 C) 97.8 F (36.6 C) 98.5 F (36.9 C) 98.7 F (37.1 C)  TempSrc: Oral Oral  Oral  SpO2: 95% 96% 99% 97%  Weight:      Height:       General.  Frail elderly man, in no acute distress. Pulmonary.  Lungs clear bilaterally, normal respiratory effort. CV.  Regular rate and rhythm, no JVD, rub or murmur. Abdomen.  Soft, nontender, nondistended, BS positive. CNS.  Alert and oriented .  No focal neurologic deficit. Extremities.  Bilateral lower extremities with Ace wrap and much improved erythema  Data Reviewed: Prior data reviewed.  Family Communication: Called sister with no response  Disposition: Status is: Inpatient Remains inpatient appropriate because: Severity of illness  Planned Discharge Destination: Home  DVT prophylaxis.  SCDs Time spent: 42 minutes  This record has been created using Conservation officer, historic buildings. Errors have been sought and corrected,but may not always be located. Such creation errors do not reflect on the standard of care.   Author: Arnetha Courser, MD 01/17/2023 1:08 PM  For on call review [email or web address].

## 2023-01-17 NOTE — Assessment & Plan Note (Signed)
Has history of chronic CHF.  Has been on lasix.  Recent issues with orthostatic hypotension.  Noted weakness and increased sob today.  ER/hospital evaluation as outlined.  EMS called and transported him to ER. ER notified.

## 2023-01-17 NOTE — Assessment & Plan Note (Signed)
Prozac 20mg  q day.  Follow.

## 2023-01-17 NOTE — Assessment & Plan Note (Signed)
No upper symptoms reported.   

## 2023-01-17 NOTE — Assessment & Plan Note (Addendum)
Being followed by endocrinology.  Last a1c 7.2.  Follow met b and a1c.

## 2023-01-17 NOTE — Assessment & Plan Note (Signed)
Orthostatic hypotension in office.  Brief standing - systolic 70.  EMS called to transport him to ER.  ER notified.

## 2023-01-17 NOTE — Assessment & Plan Note (Signed)
Continue pravastatin 

## 2023-01-17 NOTE — Assessment & Plan Note (Addendum)
Continue pravastatin.  Follow lipid panel and liver function tests.   

## 2023-01-17 NOTE — Assessment & Plan Note (Signed)
SOB with exertion as outlined.  Weakness.  To ER for evaluation as outlined.  EMS notified.

## 2023-01-17 NOTE — Assessment & Plan Note (Signed)
Continue risk factor modification. No chest pain.  Weakness - orthostatic.  ER/hospital evaluation as outlined.

## 2023-01-17 NOTE — NC FL2 (Signed)
[place] MEDICAID FL2 LEVEL OF CARE FORM     IDENTIFICATION  Patient Name: Gary SimaJames S Calixte Birthdate: April 04, 1946 Sex: male Admission Date (Current Location): 01/12/2023  [place]   Facility and Address:  [facility], [address], [place], [place] [address]      Provider Number: [id number]  Attending Physician Name and Address:  Arnetha CourserAmin, Sumayya, MD  Relative Name and Phone Number:  Tonye Royaltyeacock,Susan (Sister) [phone] (Home Phone)    Current Level of Care: Hospital Recommended Level of Care: Skilled Nursing Facility Prior Approval Number: [phone]  Date Approved/Denied:   PASRR Number:    Discharge Plan: SNF    Current Diagnoses: Patient Active Problem List   Diagnosis Date Noted   Increased anion gap metabolic acidosis 01/14/2023   Elevated INR 01/13/2023   Pressure injury of skin 01/13/2023   Orthostatic hypotension 01/12/2023   Sepsis due to cellulitis 01/12/2023   AKI (acute kidney injury) 01/12/2023   Depression, major, single episode, mild 11/15/2022   Recurrent falls 08/29/2021   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    Left rib fracture_7th rib    Leukocytosis    HTN (hypertension)    Hypothyroid    CAD (coronary artery disease)    Porokeratosis 05/15/2021   Aortic atherosclerosis 05/05/2021   Emphysema lung 05/05/2021   Thrombocytopenia 04/29/2021   Splinter of foot without infection 02/23/2021   Arm laceration 02/09/2021   Hypokalemia 10/24/2020   Injury by nail 08/15/2020   Shingles 07/24/2020   Rash 07/02/2020   Colon cancer screening 07/02/2020   Sick sinus syndrome 12/28/2019   Acquired absence of kidney 09/13/2019   Edema of lower extremity 09/13/2019   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, or unspecified 09/13/2019   Proteinuria 09/13/2019   Lower extremity pain, bilateral    Cellulitis 08/18/2019   Left hip pain  07/16/2019   Pain due to onychomycosis of toenails of both feet 04/03/2019   Swelling of limb 02/07/2019   CKD (chronic kidney disease) stage 3, GFR 30-59 ml/min 09/16/2018   Left shoulder pain 09/16/2018   Chronic cholecystitis 12/29/2017   Graves disease 12/29/2017   Peripheral neuropathy 12/29/2017   S/p nephrectomy 12/29/2017   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   Symptomatic bradycardia 12/02/2017   CKD (chronic kidney disease), stage IIIa 12/02/2017   Chronic systolic CHF (congestive heart failure) 12/02/2017   Saturday night paralysis 11/12/2017   Hypoglycemia 10/28/2017   Goals of care, counseling/discussion 10/24/2017   Lymphedema 10/20/2017   Iron deficiency anemia 09/11/2017   Fall 08/13/2017   Humerus fracture 08/13/2017   Anemia 01/12/2017   Bilateral carotid artery stenosis 06/29/2016   Hand laceration 12/16/2015   Adjustment disorder with depressed mood 08/11/2015   Cardiomyopathy, idiopathic 04/22/2015   CAD in native artery 11/02/2014   Mantle cell lymphoma 08/03/2014   History of colonic polyps 04/29/2014   Irregular heart beat 04/22/2014   SOB (shortness of breath) 04/22/2014   Stress 03/21/2014   PVC (premature ventricular contraction) 02/21/2014   GERD (gastroesophageal reflux disease) 10/23/2013   Gastroparesis 06/25/2013   Chest pain 04/13/2013   Thyroid disease 04/13/2013   Diabetes 04/13/2013   Essential hypertension, benign 04/13/2013   Hypercholesterolemia 04/13/2013   Constipation, unspecified 10/13/1999   Depression, unspecified 10/13/1999   Dry eye syndrome of bilateral lacrimal glands 10/13/1999   Chronic venous insufficiency 10/13/1999   Athscl heart disease of native coronary artery w/o ang pctrs 10/13/1999  Orientation RESPIRATION BLADDER Height & Weight     Self, Time, Situation, Place  Normal   Weight: 99.2 kg Height:  5\' 10"  (177.8 cm)  BEHAVIORAL SYMPTOMS/MOOD NEUROLOGICAL BOWEL NUTRITION STATUS        Diet  AMBULATORY  STATUS COMMUNICATION OF NEEDS Skin   Extensive Assist Verbally Bruising, Other (Comment) (buttocks red, bruising upper back and arms per flowsheet.)                       Personal Care Assistance Level of Assistance  Bathing, Feeding, Dressing Bathing Assistance: Maximum assistance Feeding assistance: Limited assistance Dressing Assistance: Maximum assistance     Functional Limitations Info  Sight Sight Info: Impaired (Glasses for correction)        SPECIAL CARE FACTORS FREQUENCY  PT (By licensed PT), OT (By licensed OT)     PT Frequency: 5x/week OT Frequency: 5x/week            Contractures Contractures Info: Not present    Additional Factors Info  Code Status, Allergies Code Status Info: Full Code Allergies Info: Rofecoxib as of 01/17/23.           Current Medications (01/17/2023):  This is the current hospital active medication list Current Facility-Administered Medications  Medication Dose Route Frequency Provider Last Rate Last Admin   acetaminophen (TYLENOL) tablet 650 mg  650 mg Oral Q6H PRN Cox, Amy N, DO       Or   acetaminophen (TYLENOL) suppository 650 mg  650 mg Rectal Q6H PRN Cox, Amy N, DO       cefTRIAXone (ROCEPHIN) 2 g in sodium chloride 0.9 % 100 mL IVPB  2 g Intravenous Q24H Cox, Amy N, DO 200 mL/hr at 01/17/23 1100 2 g at 01/17/23 1100   Chlorhexidine Gluconate Cloth 2 % PADS 6 each  6 each Topical Daily Arnetha Courser, MD   6 each at 01/17/23 1111   dextrose 50 % solution 0-50 mL  0-50 mL Intravenous PRN Arnetha Courser, MD       ferrous sulfate tablet 325 mg  325 mg Oral Q lunch Cox, Amy N, DO   325 mg at 01/17/23 1255   insulin aspart (novoLOG) injection 0-15 Units  0-15 Units Subcutaneous TID WC Arnetha Courser, MD   2 Units at 01/17/23 1255   insulin aspart (novoLOG) injection 0-5 Units  0-5 Units Subcutaneous QHS Arnetha Courser, MD       insulin aspart (novoLOG) injection 3 Units  3 Units Subcutaneous TID WC Arnetha Courser, MD   3 Units at  01/17/23 1255   insulin glargine-yfgn (SEMGLEE) injection 10 Units  10 Units Subcutaneous QHS Arnetha Courser, MD   10 Units at 01/16/23 2100   levothyroxine (SYNTHROID) tablet 50 mcg  50 mcg Oral QAC breakfast Arnetha Courser, MD   50 mcg at 01/17/23 0519   linezolid (ZYVOX) IVPB 600 mg  600 mg Intravenous Q12H Arnetha Courser, MD 300 mL/hr at 01/17/23 0956 600 mg at 01/17/23 0956   midodrine (PROAMATINE) tablet 10 mg  10 mg Oral TID WC Arnetha Courser, MD   10 mg at 01/17/23 1255   multivitamin with minerals tablet 1 tablet  1 tablet Oral Daily Arnetha Courser, MD   1 tablet at 01/17/23 0843   ondansetron (ZOFRAN) tablet 4 mg  4 mg Oral Q6H PRN Cox, Amy N, DO       Or   ondansetron (ZOFRAN) injection 4 mg  4 mg Intravenous Q6H PRN Cox, Amy  N, DO       Oral care mouth rinse  15 mL Mouth Rinse PRN Arnetha Courser, MD       pantoprazole (PROTONIX) EC tablet 40 mg  40 mg Oral BID AC Amin, Tilman Neat, MD   40 mg at 01/17/23 0843   pravastatin (PRAVACHOL) tablet 40 mg  40 mg Oral QHS Cox, Amy N, DO   40 mg at 01/16/23 2100   sodium bicarbonate tablet 1,300 mg  1,300 mg Oral BID Arnetha Courser, MD   1,300 mg at 01/17/23 3338   Facility-Administered Medications Ordered in Other Encounters  Medication Dose Route Frequency Provider Last Rate Last Admin   heparin lock flush 100 unit/mL  500 Units Intravenous Once Jeralyn Ruths, MD       sodium chloride flush (NS) 0.9 % injection 10 mL  10 mL Intravenous PRN Jeralyn Ruths, MD   10 mL at 02/01/18 0829   sodium chloride flush (NS) 0.9 % injection 10 mL  10 mL Intravenous PRN Jeralyn Ruths, MD   10 mL at 04/05/18 0800     Discharge Medications: Please see discharge summary for a list of discharge medications.  Relevant Imaging Results:  Relevant Lab Results:   Additional Information SSN [id number]  insulin pump 3 times daily with meals, which patient maintains himself. Understands may switch to other methods at [facility].  Bing Quarry, RN

## 2023-01-17 NOTE — Assessment & Plan Note (Signed)
Being followed by cancer center.

## 2023-01-17 NOTE — Assessment & Plan Note (Signed)
Followed by oncology. Patient has been off treatment with Gary [name] and Gary [name] since January. Last PET scan showed significant interval disease improvement so unlikely that diarrhea would stem from lymphoma or cancer treatment at this point. Stool studies negative.  Unclear etiology.  Diarrhea does appear to be improved.  Decreased appetite.  Hypotension.  To ER for further w/up and evaluation.

## 2023-01-17 NOTE — Assessment & Plan Note (Signed)
Frequent falls.  Last - yesterday evening despite fluids in office yesterday.  Did hit back of head.  Denies headache.  To ER for further w/up and evaluation and question of need for scanning.

## 2023-01-17 NOTE — Progress Notes (Signed)
GI Inpatient Follow-up Note  Subjective:  Patient seen in follow-up for dysphagia. No acute events overnight. He is no longer on oxygen via [place]. He denies any increased respiratory distress. He denies any new complaints.   Scheduled Inpatient Medications:   Chlorhexidine Gluconate Cloth  6 each Topical Daily   ferrous sulfate  325 mg Oral Q lunch   insulin aspart  0-15 Units Subcutaneous TID WC   insulin aspart  0-5 Units Subcutaneous QHS   insulin aspart  3 Units Subcutaneous TID WC   insulin glargine-yfgn  10 Units Subcutaneous QHS   levothyroxine  50 mcg Oral QAC breakfast   midodrine  10 mg Oral TID WC   multivitamin with minerals  1 tablet Oral Daily   pantoprazole  40 mg Oral BID AC   pravastatin  40 mg Oral QHS   sodium bicarbonate  1,300 mg Oral BID    Continuous Inpatient Infusions:    cefTRIAXone (ROCEPHIN)  IV 2 g (01/17/23 1100)   linezolid (ZYVOX) IV 600 mg (01/17/23 0956)    PRN Inpatient Medications:  acetaminophen **OR** acetaminophen, dextrose, ondansetron **OR** ondansetron (ZOFRAN) IV, mouth rinse  Review of Systems: Constitutional: Weight is stable.  Eyes: No changes in vision. ENT: No oral lesions, sore throat.  GI: see HPI.  Heme/Lymph: No easy bruising.  CV: No chest pain.  GU: No hematuria.  Integumentary: No rashes.  Neuro: No headaches.  Psych: No depression/anxiety.  Endocrine: No heat/cold intolerance.  Allergic/Immunologic: No urticaria.  Resp: No cough, SOB.  Musculoskeletal: No joint swelling.    Physical Examination: BP (!) 101/51   Pulse (!) 59   Temp 98.5 F (36.9 C)   Resp 18   Ht 5\' 10"  (1.778 m)   Wt 99.2 kg   SpO2 99%   BMI 31.38 kg/m  Gen: NAD, alert and oriented x 4 HEENT: PEERLA, EOMI, Neck: supple, no JVD or thyromegaly Chest: CTA bilaterally, no wheezes, crackles, or other adventitious sounds CV: RRR, no m/g/c/r Abd: soft, NT, ND, +BS in all four quadrants; no HSM, guarding, ridigity, or rebound  tenderness Ext: no edema, well perfused with 2+ pulses, Skin: no rash or lesions noted Lymph: no LAD  Data: Lab Results  Component Value Date   WBC 2.4 (L) 01/15/2023   HGB 9.5 (L) 01/15/2023   HCT 28.4 (L) 01/15/2023   MCV 90.7 01/15/2023   PLT 85 (L) 01/15/2023   Recent Labs  Lab 01/13/23 0422 01/14/23 0536 01/15/23 0407  HGB 10.2* 10.0* 9.5*   Lab Results  Component Value Date   NA 136 01/16/2023   K 3.6 01/16/2023   CL 104 01/16/2023   CO2 23 01/16/2023   BUN 32 (H) 01/16/2023   CREATININE 1.40 (H) 01/16/2023   Lab Results  Component Value Date   ALT 8 01/14/2023   AST 17 01/14/2023   ALKPHOS 77 01/14/2023   BILITOT 1.4 (H) 01/14/2023   Recent Labs  Lab 01/14/23 0536 01/15/23 0407  APTT 69*  --   INR 3.1* 2.0*    Assessment/Plan:  \77 y/o Caucasian male with a PMH of recurrent Stage III mantle cell lymphoma, IDDM, HTN, HLD, and depression presented to [facility] ED 4/2 for chief complaint of generalized weakness and hypotension. GI following for dysphagia with subsequent esophagogram showing potential GEJ narrowing   Dysphagia/Abnormal barium esophagogram - potential GEJ narrowing   Shortness of breath - improved, no longer requiring O2  Coagulopathic - last INR 2.0  Sepsis 2/2 cellulitis  AKI  GERD  Mantle cell lymphoma s/p chemotherapy   Recommendations:  - Tentative EGD tomorrow with Dr. [name] depending on repeat exam and monitoring of BP and respiratory status - ADAT. NPO midnight.  - See procedure note for findings and further recommendations - Continue management of medical comorbidities per primary team   I reviewed the risks (including bleeding, perforation, infection, anesthesia complications, cardiac/respiratory complications), benefits and alternatives of EGD. Patient consents to proceed.    Please call with questions or concerns.   Jacob Moores, PA-C [facility] [phone]

## 2023-01-18 ENCOUNTER — Inpatient Hospital Stay: Payer: Medicare Other | Admitting: Anesthesiology

## 2023-01-18 ENCOUNTER — Encounter
Admission: EM | Disposition: A | Discharge status: Skilled Nursing Facility | Payer: Self-pay | Source: Ambulatory Visit | Attending: Internal Medicine

## 2023-01-18 DIAGNOSIS — K449 Diaphragmatic hernia without obstruction or gangrene: Secondary | ICD-10-CM | POA: Diagnosis not present

## 2023-01-18 DIAGNOSIS — R1310 Dysphagia, unspecified: Secondary | ICD-10-CM | POA: Diagnosis not present

## 2023-01-18 DIAGNOSIS — K2970 Gastritis, unspecified, without bleeding: Secondary | ICD-10-CM | POA: Diagnosis not present

## 2023-01-18 DIAGNOSIS — L0390 Cellulitis, unspecified: Secondary | ICD-10-CM | POA: Diagnosis not present

## 2023-01-18 DIAGNOSIS — K222 Esophageal obstruction: Secondary | ICD-10-CM | POA: Diagnosis not present

## 2023-01-18 DIAGNOSIS — E039 Hypothyroidism, unspecified: Secondary | ICD-10-CM | POA: Diagnosis not present

## 2023-01-18 DIAGNOSIS — N179 Acute kidney failure, unspecified: Secondary | ICD-10-CM | POA: Diagnosis not present

## 2023-01-18 DIAGNOSIS — I10 Essential (primary) hypertension: Secondary | ICD-10-CM | POA: Diagnosis not present

## 2023-01-18 DIAGNOSIS — K224 Dyskinesia of esophagus: Secondary | ICD-10-CM | POA: Diagnosis not present

## 2023-01-18 HISTORY — PX: ESOPHAGOGASTRODUODENOSCOPY (EGD) WITH PROPOFOL: SHX5813

## 2023-01-18 LAB — CBC
HCT: 33.5 % — ABNORMAL LOW (ref 39.0–52.0)
Hemoglobin: 10.7 g/dL — ABNORMAL LOW (ref 13.0–17.0)
MCH: 29.6 pg (ref 26.0–34.0)
MCHC: 31.9 g/dL (ref 30.0–36.0)
MCV: 92.8 fL (ref 80.0–100.0)
Platelets: 76 10*3/uL — ABNORMAL LOW (ref 150–400)
RBC: 3.61 MIL/uL — ABNORMAL LOW (ref 4.22–5.81)
RDW: 14.6 % (ref 11.5–15.5)
WBC: 2.8 10*3/uL — ABNORMAL LOW (ref 4.0–10.5)
nRBC: 0 % (ref 0.0–0.2)

## 2023-01-18 LAB — BASIC METABOLIC PANEL
Anion gap: 8 (ref 5–15)
BUN: 18 mg/dL (ref 8–23)
CO2: 24 mmol/L (ref 22–32)
Calcium: 7.5 mg/dL — ABNORMAL LOW (ref 8.9–10.3)
Chloride: 106 mmol/L (ref 98–111)
Creatinine, Ser: 1.03 mg/dL (ref 0.61–1.24)
GFR, Estimated: >60 mL/min (ref >60)
Glucose, Bld: 190 mg/dL — ABNORMAL HIGH (ref 70–99)
Potassium: 4.3 mmol/L (ref 3.5–5.1)
Sodium: 138 mmol/L (ref 135–145)

## 2023-01-18 LAB — GLUCOSE, CAPILLARY
Glucose-Capillary: 131 mg/dL — ABNORMAL HIGH (ref 70–99)
Glucose-Capillary: 141 mg/dL — ABNORMAL HIGH (ref 70–99)
Glucose-Capillary: 173 mg/dL — ABNORMAL HIGH (ref 70–99)
Glucose-Capillary: 174 mg/dL — ABNORMAL HIGH (ref 70–99)
Glucose-Capillary: 180 mg/dL — ABNORMAL HIGH (ref 70–99)
Glucose-Capillary: 204 mg/dL — ABNORMAL HIGH (ref 70–99)

## 2023-01-18 LAB — VITAMIN K1, SERUM: VITAMIN K1: <0.1 ng/mL — ABNORMAL LOW (ref 0.10–2.20)

## 2023-01-18 LAB — PROTIME-INR
INR: 1.4 — ABNORMAL HIGH (ref 0.8–1.2)
Prothrombin Time: 16.8 seconds — ABNORMAL HIGH (ref 11.4–15.2)

## 2023-01-18 SURGERY — ESOPHAGOGASTRODUODENOSCOPY (EGD) WITH PROPOFOL
Anesthesia: General

## 2023-01-18 MED ORDER — PROPOFOL 10 MG/ML IV BOLUS
INTRAVENOUS | Status: DC | PRN
  Administered 2023-01-18: 50 mg via INTRAVENOUS

## 2023-01-18 MED ORDER — LINEZOLID 600 MG PO TABS
600.0000 mg | ORAL_TABLET | Freq: Two times a day (BID) | ORAL | Status: DC
  Administered 2023-01-18 – 2023-01-20 (×5): 600 mg via ORAL
  Filled 2023-01-18 (×5): qty 1

## 2023-01-18 MED ORDER — LIDOCAINE HCL (PF) 2 % IJ SOLN
INTRAMUSCULAR | Status: AC
  Filled 2023-01-18: qty 5

## 2023-01-18 MED ORDER — PROPOFOL 500 MG/50ML IV EMUL
INTRAVENOUS | Status: DC | PRN
  Administered 2023-01-18: 120 ug/kg/min via INTRAVENOUS

## 2023-01-18 MED ORDER — LIDOCAINE HCL (CARDIAC) PF 100 MG/5ML IV SOSY
PREFILLED_SYRINGE | INTRAVENOUS | Status: DC | PRN
  Administered 2023-01-18: 100 mg via INTRAVENOUS

## 2023-01-18 MED ORDER — SODIUM CHLORIDE 0.9 % IV SOLN: INTRAVENOUS | Status: DC

## 2023-01-18 NOTE — Care Plan (Signed)
Brief GI Post Op note  Please see op note for further details Mild narrowing of GEJ; dilated with 46 and 48 Fr Maloney without resistance. Small superficial tear appreciated at UES and GEJ post 48 french (none with 46 french on relook) Continue BID ppi Follow up with primary GI as outpatient  GI to sign off. Available as needed. Please do not hesitate to call regarding questions or concerns.  [name] Slipper, DO [facility]astroenterology

## 2023-01-18 NOTE — Progress Notes (Addendum)
PT Cancellation Note  Patient Details Name: Gary [name] MRN: [id number] DOB: 03/01/1946   Cancelled Treatment:     PT attempt. Upon arrival, transport arrived to take pt to procedure/test. Will return later this date and continue to follow per current POC.   1456: several attempts made throughout the day. Pt unavailable to participate. PT will return tomorrow and continue to follow per current POC.    Rushie Chestnut 01/18/2023, 11:44 AM

## 2023-01-18 NOTE — Transfer of Care (Signed)
Immediate Anesthesia Transfer of Care Note  Patient: Gary [name]  Procedure(s) Performed: ESOPHAGOGASTRODUODENOSCOPY (EGD) WITH PROPOFOL  Patient Location: PACU  Anesthesia Type:General  Level of Consciousness: drowsy  Airway & Oxygen Therapy: Patient Spontanous Breathing and Patient connected to nasal cannula oxygen  Post-op Assessment: Report given to RN and Post -op Vital signs reviewed and stable  Post vital signs: Reviewed and stable  Last Vitals:  Vitals Value Taken Time  BP    Temp    Pulse    Resp    SpO2      Last Pain:  Vitals:   01/18/23 0715  TempSrc:   PainSc: 0-No pain      Patients Stated Pain Goal: 0 (01/17/23 0845)  Complications: No notable events documented.

## 2023-01-18 NOTE — Anesthesia Preprocedure Evaluation (Addendum)
Anesthesia Evaluation  Patient identified by MRN, date of birth, ID band Patient awake    Reviewed: Allergy & Precautions, NPO status , Patient's Chart, lab work & pertinent test results  History of Anesthesia Complications Negative for: history of anesthetic complications  Airway Mallampati: III  TM Distance: >3 FB Neck ROM: full    Dental  (+) Edentulous Upper, Edentulous Lower   Pulmonary COPD, former smoker   Pulmonary exam normal        Cardiovascular hypertension, On Medications + CAD and +CHF  Normal cardiovascular exam  ECHO IMPRESSIONS     1. Left ventricular ejection fraction, by visual estimation, is 50 to  55%. The left ventricle has normal function. There is no left ventricular  hypertrophy.   2. Global right ventricle has normal systolic function.The right  ventricular size is normal. No increase in right ventricular wall  thickness.   3. Left atrial size was normal.   4. Right atrial size was normal.   5. The mitral valve is normal in structure. Mild mitral valve  regurgitation. No evidence of mitral stenosis.   6. The tricuspid valve is normal in structure. Tricuspid valve  regurgitation is mild.   7. The aortic valve is normal in structure. Aortic valve regurgitation is  not visualized. No evidence of aortic valve sclerosis or stenosis.   8. The pulmonic valve was normal in structure. Pulmonic valve  regurgitation is not visualized.   9. Normal pulmonary artery systolic pressure.  10. The inferior vena cava is normal in size with greater than 50%  respiratory variability, suggesting right atrial pressure of 3 mmHg.      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 No date: Gastroparesis No date: Graves disease No date: Hypercholesterolemia No date: Hypertension 07/2014: Mantle cell lymphoma No date: Peripheral neuropathy No date: Shingles  Past Surgical History: No date: CHOLECYSTECTOMY No date: NEPHRECTOMY     Comment:  right 12/07/2017: PACEMAKER INSERTION; N/A     Comment:  Procedure: INSERTION PACEMAKER DUAL CHAMBER INITIAL               INSERT;  Surgeon: Marcina Millard, MD;  Location:               [facility] ORS;  Service: Cardiovascular;  Laterality: N/A; 11/03/2017: PORTA CATH INSERTION; N/A     Comment:  Procedure: PORTA CATH INSERTION;  Surgeon: Renford Dills, MD;  Location: [facility] INVASIVE CV LAB;  Service:              Cardiovascular;  Laterality: N/A;  BMI    Body Mass Index: 31.38 kg/m      Reproductive/Obstetrics negative OB ROS                             Anesthesia Physical Anesthesia Plan  ASA: 3  Anesthesia Plan: General   Post-op Pain Management: Minimal or no pain anticipated   Induction: Intravenous  PONV Risk Score and Plan: 1 and Propofol infusion and TIVA  Airway Management Planned: Natural Airway and Nasal Cannula  Additional Equipment:   Intra-op Plan:   Post-operative Plan:   Informed Consent: I have reviewed the patients History and Physical, chart, labs and discussed the procedure including the risks, benefits and alternatives for the proposed anesthesia with the patient or authorized representative who has indicated his/her understanding and acceptance.     Dental Advisory Given  Plan Discussed with:  Anesthesiologist, CRNA and Surgeon  Anesthesia Plan Comments: (Patient consented for risks of anesthesia including but not limited to:  - adverse reactions to medications - risk of airway placement if required - damage to eyes, teeth, lips or other oral mucosa - nerve damage due to positioning  - sore throat or hoarseness - Damage to heart, brain, nerves, lungs, other parts of body or loss of life  Patient voiced understanding.)        Anesthesia Quick Evaluation

## 2023-01-18 NOTE — Anesthesia Postprocedure Evaluation (Signed)
Anesthesia Post Note  Patient: Gary [name]  Procedure(s) Performed: ESOPHAGOGASTRODUODENOSCOPY (EGD) WITH PROPOFOL  Patient location during evaluation: Endoscopy Anesthesia Type: General Level of consciousness: awake and alert Pain management: pain level controlled Vital Signs Assessment: post-procedure vital signs reviewed and stable Respiratory status: spontaneous breathing, nonlabored ventilation, respiratory function stable and patient connected to nasal cannula oxygen Cardiovascular status: blood pressure returned to baseline and stable Postop Assessment: no apparent nausea or vomiting Anesthetic complications: no   No notable events documented.   Last Vitals:  Vitals:   01/18/23 1200 01/18/23 1231  BP: (!) 127/59 (!) 111/50  Pulse: 61   Resp: 16   Temp: (!) 36.3 C 36.8 C  SpO2: 99%     Last Pain:  Vitals:   01/18/23 1231  TempSrc: Temporal  PainSc: Asleep                 Louie Boston

## 2023-01-18 NOTE — Progress Notes (Addendum)
Brief GI Note  Pt's labs reviewed Npo since midnight Respiratory status and BP have improved since last week. Esophagram reviewed with patient. Plan for EGD with possible intervention and dilation today for dysphagia No current dvt ppx or oac/antiplt  Pt reports increased loose stools. Can consider imodium/loperamide or colestipol (he is s/p cholecystectomy) if this is true. Story regarding stool output is variable throughout his hospitalization. Suspect this is relating to his current abx. Infectious stool w/u negative.  PE: CV s1s2 rrr Pulm: Clear but diminished bilaterally. No w/r/r GI: soft, ntnd, no guarding/rigidity Ext: BLE edema/wrapped  Plan: EGD Esophagogastroduodenoscopy with possible biopsy, control of bleeding, polypectomy, and interventions as necessary has been discussed with the patient/patient representative. Informed consent was obtained from the patient/patient representative after explaining the indication, nature, and risks of the procedure including but not limited to death, bleeding, perforation, missed neoplasm/lesions, cardiorespiratory compromise, and reaction to medications. Opportunity for questions was given and appropriate answers were provided. Patient/patient representative has verbalized understanding is amenable to undergoing the procedure.  Further recommendations pending endoscopy. See op report for further details.  [name] Slipper, DO [facility]

## 2023-01-18 NOTE — Anesthesia Procedure Notes (Signed)
Date/Time: 01/18/2023 12:14 PM  Performed by: Malva Cogan, CRNAPre-anesthesia Checklist: Patient identified, Emergency Drugs available, Suction available, Patient being monitored and Timeout performed Patient Re-evaluated:Patient Re-evaluated prior to induction Oxygen Delivery Method: Nasal cannula Induction Type: IV induction Placement Confirmation: CO2 detector and positive ETCO2

## 2023-01-18 NOTE — Consult Note (Signed)
   [facility] CM Inpatient Consult   01/18/2023  Gary [name] 21-Jun-1946     Location: [facility] RN Hospital Liaison screen pt remotely [facility]).   [facility] Customer service manager [facility]) Accountable Care Organization [ACO] Patient: Insurance [facility])    Primary Care Provider:  Dale Hatley, MD [facility]   Patient screened for readmission hospitalization with noted high risk score for unplanned readmission risk with 1 IP in 6 months. [facility]/Population Health RN liaison will assess for potential [facility]) Care Management service needs for post hospital transition for care coordination. Pt to be discharged to [facility]. Will collaborate with PAC-RN to follow accordingly at SNF.  Plan. [facility]/Population Health RN Liaison will continue to follow ongoing disposition in assessing for post hospital community care coordination/management needs.  Referral request for community care coordination: pending disposition.   [facility] Care Management/Population Health does not replace or interfere with any arrangements made by the Inpatient Transition of Care team.   For questions contact:    Elliot Cousin, RN, BSN [facility] Liaison [place]   [facility]  Population Health Office Hours MTWF 7:30 am to 6 pm [phone] mobile [phone] [Office toll free line][facility] Office Hours are M-F 8:30 - 5 pm 24 hour nurse advise line [phone] Conceirge  [email or web address]

## 2023-01-18 NOTE — TOC Progression Note (Signed)
Transition of Care [facility]) - Progression Note    Patient Details  Name: MASSI SHUTE MRN: [id number] Date of Birth: June 11, 1946  Transition of Care [facility]) CM/SW Contact  Truddie Hidden, RN Phone Number: 01/18/2023, 3:08 PM  Clinical Narrative:    Attempt to talk to patient at bedside. Unable to give offer at this time.    Expected Discharge Plan:  (Pending Medical Workup) Barriers to Discharge: No Barriers Identified  Expected Discharge Plan and Services   Discharge Planning Services: CM Consult   Living arrangements for the past 2 months: Single Family Home                   DME Agency:  (Patient has a rolling walker and a single point cane at home.)                   Social Determinants of Health (SDOH) Interventions SDOH Screenings   Food Insecurity: No Food Insecurity (01/12/2023)  Housing: Low Risk  (01/12/2023)  Transportation Needs: No Transportation Needs (01/12/2023)  Recent Concern: Transportation Needs - Unmet Transportation Needs (01/11/2023)  Utilities: Not At Risk (01/12/2023)  Alcohol Screen: Low Risk  (11/27/2020)  Depression (PHQ2-9): High Risk (01/12/2023)  Financial Resource Strain: Low Risk  (03/10/2022)  Physical Activity: Inactive (03/10/2022)  Social Connections: Socially Isolated (03/10/2022)  Stress: No Stress Concern Present (03/10/2022)  Tobacco Use: High Risk (01/17/2023)    Readmission Risk Interventions    01/15/2023   11:37 AM 08/30/2021   10:48 AM  Readmission Risk Prevention Plan  Transportation Screening Complete Complete  PCP or Specialist Appt within 3-5 Days Complete Complete  HRI or Home Care Consult Complete Complete  Social Work Consult for Recovery Care Planning/Counseling Complete Complete  Palliative Care Screening Not Applicable Not Applicable  Medication Review Oceanographer) Complete Complete

## 2023-01-18 NOTE — Plan of Care (Signed)
Problem: Fluid Volume: Goal: Hemodynamic stability will improve 01/18/2023 0741 by Genia Hotter, RN Outcome: Progressing 01/18/2023 0741 by Genia Hotter, RN Outcome: Progressing   Problem: Clinical Measurements: Goal: Diagnostic test results will improve 01/18/2023 0741 by Genia Hotter, RN Outcome: Progressing 01/18/2023 0741 by Genia Hotter, RN Outcome: Progressing Goal: Signs and symptoms of infection will decrease 01/18/2023 0741 by Genia Hotter, RN Outcome: Progressing 01/18/2023 0741 by Genia Hotter, RN Outcome: Progressing   Problem: Respiratory: Goal: Ability to maintain adequate ventilation will improve 01/18/2023 0741 by Genia Hotter, RN Outcome: Progressing 01/18/2023 0741 by Genia Hotter, RN Outcome: Progressing   Problem: Education: Goal: Knowledge of General Education information will improve Description: Including pain rating scale, medication(s)/side effects and non-pharmacologic comfort measures 01/18/2023 0741 by Genia Hotter, RN Outcome: Progressing 01/18/2023 0741 by Genia Hotter, RN Outcome: Progressing   Problem: Health Behavior/Discharge Planning: Goal: Ability to manage health-related needs will improve 01/18/2023 0741 by Genia Hotter, RN Outcome: Progressing 01/18/2023 0741 by Genia Hotter, RN Outcome: Progressing   Problem: Clinical Measurements: Goal: Ability to maintain clinical measurements within normal limits will improve 01/18/2023 0741 by Genia Hotter, RN Outcome: Progressing 01/18/2023 0741 by Genia Hotter, RN Outcome: Progressing Goal: Will remain free from infection 01/18/2023 0741 by Genia Hotter, RN Outcome: Progressing 01/18/2023 0741 by Genia Hotter, RN Outcome: Progressing Goal: Diagnostic test results will improve 01/18/2023 0741 by Genia Hotter, RN Outcome: Progressing 01/18/2023 0741 by Genia Hotter, RN Outcome: Progressing Goal: Respiratory complications will improve 01/18/2023 0741 by Genia Hotter, RN Outcome:  Progressing 01/18/2023 0741 by Genia Hotter, RN Outcome: Progressing Goal: Cardiovascular complication will be avoided 01/18/2023 0741 by Genia Hotter, RN Outcome: Progressing 01/18/2023 0741 by Genia Hotter, RN Outcome: Progressing   Problem: Activity: Goal: Risk for activity intolerance will decrease 01/18/2023 0741 by Genia Hotter, RN Outcome: Progressing 01/18/2023 0741 by Genia Hotter, RN Outcome: Progressing   Problem: Nutrition: Goal: Adequate nutrition will be maintained 01/18/2023 0741 by Genia Hotter, RN Outcome: Progressing 01/18/2023 0741 by Genia Hotter, RN Outcome: Progressing   Problem: Coping: Goal: Level of anxiety will decrease 01/18/2023 0741 by Genia Hotter, RN Outcome: Progressing 01/18/2023 0741 by Genia Hotter, RN Outcome: Progressing   Problem: Elimination: Goal: Will not experience complications related to bowel motility 01/18/2023 0741 by Genia Hotter, RN Outcome: Progressing 01/18/2023 0741 by Genia Hotter, RN Outcome: Progressing Goal: Will not experience complications related to urinary retention 01/18/2023 0741 by Genia Hotter, RN Outcome: Progressing 01/18/2023 0741 by Genia Hotter, RN Outcome: Progressing   Problem: Pain Managment: Goal: General experience of comfort will improve 01/18/2023 0741 by Genia Hotter, RN Outcome: Progressing 01/18/2023 0741 by Genia Hotter, RN Outcome: Progressing   Problem: Safety: Goal: Ability to remain free from injury will improve 01/18/2023 0741 by Genia Hotter, RN Outcome: Progressing 01/18/2023 0741 by Genia Hotter, RN Outcome: Progressing   Problem: Skin Integrity: Goal: Risk for impaired skin integrity will decrease 01/18/2023 0741 by Genia Hotter, RN Outcome: Progressing 01/18/2023 0741 by Genia Hotter, RN Outcome: Progressing   Problem: Education: Goal: Ability to describe self-care measures that may prevent or decrease complications (Diabetes Survival Skills Education) will improve 01/18/2023 0741 by  Genia Hotter, RN Outcome: Progressing 01/18/2023 0741 by Genia Hotter, RN Outcome: Progressing Goal: Individualized Educational Video(s) 01/18/2023 0741 by Genia Hotter, RN Outcome: Progressing 01/18/2023 0741 by Genia Hotter, RN Outcome: Progressing   Problem: Coping: Goal: Ability to adjust to condition or change in health will improve 01/18/2023 0741 by Genia Hotter, RN Outcome: Progressing 01/18/2023 0741 by Genia Hotter, RN  Outcome: Progressing   Problem: Fluid Volume: Goal: Ability to maintain a balanced intake and output will improve 01/18/2023 0741 by Genia Hotter, RN Outcome: Progressing 01/18/2023 0741 by Genia Hotter, RN Outcome: Progressing   Problem: Health Behavior/Discharge Planning: Goal: Ability to identify and utilize available resources and services will improve 01/18/2023 0741 by Genia Hotter, RN Outcome: Progressing 01/18/2023 0741 by Genia Hotter, RN Outcome: Progressing Goal: Ability to manage health-related needs will improve 01/18/2023 0741 by Genia Hotter, RN Outcome: Progressing 01/18/2023 0741 by Genia Hotter, RN Outcome: Progressing   Problem: Metabolic: Goal: Ability to maintain appropriate glucose levels will improve 01/18/2023 0741 by Genia Hotter, RN Outcome: Progressing 01/18/2023 0741 by Genia Hotter, RN Outcome: Progressing   Problem: Nutritional: Goal: Maintenance of adequate nutrition will improve 01/18/2023 0741 by Genia Hotter, RN Outcome: Progressing 01/18/2023 0741 by Genia Hotter, RN Outcome: Progressing Goal: Progress toward achieving an optimal weight will improve 01/18/2023 0741 by Genia Hotter, RN Outcome: Progressing 01/18/2023 0741 by Genia Hotter, RN Outcome: Progressing   Problem: Skin Integrity: Goal: Risk for impaired skin integrity will decrease 01/18/2023 0741 by Genia Hotter, RN Outcome: Progressing 01/18/2023 0741 by Genia Hotter, RN Outcome: Progressing   Problem: Tissue Perfusion: Goal: Adequacy of tissue  perfusion will improve 01/18/2023 0741 by Genia Hotter, RN Outcome: Progressing 01/18/2023 0741 by Genia Hotter, RN Outcome: Progressing   Problem: Education: Goal: Ability to describe self-care measures that may prevent or decrease complications (Diabetes Survival Skills Education) will improve 01/18/2023 0741 by Genia Hotter, RN Outcome: Progressing 01/18/2023 0741 by Genia Hotter, RN Outcome: Progressing Goal: Individualized Educational Video(s) 01/18/2023 0741 by Genia Hotter, RN Outcome: Progressing 01/18/2023 0741 by Genia Hotter, RN Outcome: Progressing   Problem: Cardiac: Goal: Ability to maintain an adequate cardiac output will improve 01/18/2023 0741 by Genia Hotter, RN Outcome: Progressing 01/18/2023 0741 by Genia Hotter, RN Outcome: Progressing   Problem: Health Behavior/Discharge Planning: Goal: Ability to identify and utilize available resources and services will improve 01/18/2023 0741 by Genia Hotter, RN Outcome: Progressing 01/18/2023 0741 by Genia Hotter, RN Outcome: Progressing Goal: Ability to manage health-related needs will improve 01/18/2023 0741 by Genia Hotter, RN Outcome: Progressing 01/18/2023 0741 by Genia Hotter, RN Outcome: Progressing   Problem: Fluid Volume: Goal: Ability to achieve a balanced intake and output will improve 01/18/2023 0741 by Genia Hotter, RN Outcome: Progressing 01/18/2023 0741 by Genia Hotter, RN Outcome: Progressing   Problem: Metabolic: Goal: Ability to maintain appropriate glucose levels will improve 01/18/2023 0741 by Genia Hotter, RN Outcome: Progressing 01/18/2023 0741 by Genia Hotter, RN Outcome: Progressing   Problem: Nutritional: Goal: Maintenance of adequate nutrition will improve 01/18/2023 0741 by Genia Hotter, RN Outcome: Progressing 01/18/2023 0741 by Genia Hotter, RN Outcome: Progressing Goal: Maintenance of adequate weight for body size and type will improve 01/18/2023 0741 by Genia Hotter, RN Outcome:  Progressing 01/18/2023 0741 by Genia Hotter, RN Outcome: Progressing   Problem: Respiratory: Goal: Will regain and/or maintain adequate ventilation 01/18/2023 0741 by Genia Hotter, RN Outcome: Progressing 01/18/2023 0741 by Genia Hotter, RN Outcome: Progressing   Problem: Urinary Elimination: Goal: Ability to achieve and maintain adequate renal perfusion and functioning will improve 01/18/2023 0741 by Genia Hotter, RN Outcome: Progressing 01/18/2023 0741 by Genia Hotter, RN Outcome: Progressing

## 2023-01-18 NOTE — Care Management Important Message (Signed)
Important Message  Patient Details  Name: Gary [name] MRN: [id number] Date of Birth: 08-16-1946   Medicare Important Message Given:  Yes     [name] 01/18/2023, 12:01 PM

## 2023-01-18 NOTE — Inpatient Diabetes Management (Signed)
Inpatient Diabetes Program Recommendations  AACE/ADA: New Consensus Statement on Inpatient Glycemic Control (2015)  Target Ranges:  Prepandial:   less than 140 mg/dL      Peak postprandial:   less than 180 mg/dL (1-2 hours)      Critically ill patients:  140 - 180 mg/dL    Latest Reference Range & Units 01/16/23 23:24 01/17/23 03:29 01/17/23 08:27 01/17/23 11:29 01/17/23 17:16 01/17/23 17:28 01/17/23 17:44 01/17/23 20:18  Glucose-Capillary 70 - 99 mg/dL 295 (H) 284 (H) 132 (H)  5 units Novolog  125 (H)  5 units Novolog @1255  61 (L) 63 (L) 100 (H) 156 (H)  10 units Semglee @2113   (H): Data is abnormally high (L): Data is abnormally low  Latest Reference Range & Units 01/18/23 00:07 01/18/23 04:10 01/18/23 08:04  Glucose-Capillary 70 - 99 mg/dL 440 (H) 102 (H) 725 (H)  3 units Novolog   (H): Data is abnormally high  Diabetes history: DM1 (does not make insulin) Needs correction, basal and meal coverage   Outpatient Diabetes medications:  Metronic Insulin Pump  Basal rates  Midnight = 0.675  6 AM = 0.8  12 PM = 0.75  9 PM = 0.775  TDD basal: 17.925 units   Bolus settings  I/C: 9  ISF: 60  Target Glucose: 100-130  Active insulin time: 3 hours     Current Orders: Semglee 10 QHS  Novolog 0-15 units TID ac/hs  Novolog 3 units TID with meals    MD- Note Hypoglycemia yesterday at 5pm after pt received total of 5 units Novolog insulin (2 units SSI + 3 units meal coverage) at 12pm  May consider reducing the Novolog SSI to the 0-6 unit Very Sensitive scale    --Will follow patient during hospitalization--  Ambrose Finland RN, MSN, CDCES Diabetes Coordinator Inpatient Glycemic Control Team Team Pager: [phone] (8a-5p)

## 2023-01-18 NOTE — Op Note (Signed)
[facility] Gastroenterology Patient Name: Gary [name] Procedure Date: 01/18/2023 11:59 AM MRN: [id number] Account #: [id number] Date of Birth: [birth year] Admit Type: Outpatient Age: 77 Room: [facility] ENDO ROOM 2 Gender: Male Note Status: Finalized Instrument Name: Upper Endoscope [id number] Procedure:             Upper GI endoscopy Indications:           Dysphagia Providers:             Jaynie Collins DO, DO Medicines:             Monitored Anesthesia Care Complications:         No immediate complications. Estimated blood loss:                         Minimal. Procedure:             Pre-Anesthesia Assessment:                        - Prior to the procedure, a History and Physical was                         performed, and patient medications and allergies were                         reviewed. The patient is competent. The risks and                         benefits of the procedure and the sedation options and                         risks were discussed with the patient. All questions                         were answered and informed consent was obtained.                         Patient identification and proposed procedure were                         verified by the physician, the nurse, the anesthetist                         and the technician in the endoscopy suite. Mental                         Status Examination: alert and oriented. Airway                         Examination: normal oropharyngeal airway and neck                         mobility. Respiratory Examination: poor air movement.                         CV Examination: RRR, no murmurs, no S3 or S4.                         Prophylactic Antibiotics: The patient does not require  prophylactic antibiotics. Prior Anticoagulants: The                         patient has taken no anticoagulant or antiplatelet                         agents. ASA Grade Assessment: III - A patient with                          severe systemic disease. After reviewing the risks and                         benefits, the patient was deemed in satisfactory                         condition to undergo the procedure. The anesthesia                         plan was to use monitored anesthesia care (MAC).                         Immediately prior to administration of medications,                         the patient was re-assessed for adequacy to receive                         sedatives. The heart rate, respiratory rate, oxygen                         saturations, blood pressure, adequacy of pulmonary                         ventilation, and response to care were monitored                         throughout the procedure. The physical status of the                         patient was re-assessed after the procedure.                        After obtaining informed consent, the endoscope was                         passed under direct vision. Throughout the procedure,                         the patient's blood pressure, pulse, and oxygen                         saturations were monitored continuously. The                         Endosonoscope was introduced through the mouth, and                         advanced to the second part of duodenum. The upper GI  endoscopy was accomplished without difficulty. The                         patient tolerated the procedure well. Findings:      The duodenal bulb, first portion of the duodenum and second portion of       the duodenum were normal. Estimated blood loss: none.      A small hiatal hernia was present. Estimated blood loss: none.      Diffuse mild inflammation characterized by erythema was found in the       entire examined stomach. Due to persistent thrombocytopenia and recent       coagulopathy, biopsies were not taken. Estimated blood loss: none.      One benign-appearing, intrinsic mild (non-circumferential scarring)        stenosis was found. This stenosis measured 1.1 cm (inner diameter) x       less than one cm (in length). The stenosis was traversed. The scope was       withdrawn. Dilation was performed with a Maloney dilator with no       resistance at 46 Fr and 48 Fr. The dilation site was examined following       endoscope reinsertion and showed mild mucosal disruption. No disruption       after 46 french. Relook on 48 french demonstrated superficial disruption       at GEJ and UES. No further dilation performed today. Estimated blood       loss was minimal.      Abnormal motility was noted in the esophagus. The cricopharyngeus was       normal. There is spasticity of the esophageal body. The distal       esophagus/lower esophageal sphincter is open. Tertiary peristaltic waves       are noted.      The exam of the esophagus was otherwise normal. Impression:            - Normal duodenal bulb, first portion of the duodenum                         and second portion of the duodenum.                        - Small hiatal hernia.                        - Gastritis.                        - Benign-appearing esophageal stenosis. Dilated.                        - Abnormal esophageal motility, suspicious for                         presbyesophagus.                        - No specimens collected. Recommendation:        - Return patient to hospital ward for ongoing care.                        - Soft diet today.                        -  Advance diet as tolerated for 1 day.                        - Continue present medications.                        - Use Protonix (pantoprazole) 40 mg PO BID.                        - Repeat upper endoscopy PRN for retreatment.                        - Return to GI office with primary GI provider-                          GI.                        - The findings and recommendations were discussed with                         the patient. Procedure Code(s):     ---  Professional ---                        [phone], Esophagogastroduodenoscopy, flexible,                         transoral; diagnostic, including collection of                         specimen(s) by brushing or washing, when performed                         (separate procedure)                        43450, Dilation of esophagus, by unguided sound or                         bougie, single or multiple passes Diagnosis Code(s):     --- Professional ---                        K44.9, Diaphragmatic hernia without obstruction or                         gangrene                        K29.70, Gastritis, unspecified, without bleeding                        K22.2, Esophageal obstruction                        K22.4, Dyskinesia of esophagus                        R13.10, Dysphagia, unspecified CPT copyright 2022 American [employer] Association. All rights reserved. The codes documented in this report are preliminary and upon coder review may  be revised to meet current compliance requirements. Attending Participation:      I  personally performed the entire procedure. Elfredia Nevins, DO Jaynie Collins DO, DO 01/18/2023 12:30:56 PM This report has been signed electronically. Number of Addenda: 0 Note Initiated On: 01/18/2023 11:59 AM Estimated Blood Loss:  Estimated blood loss was minimal.      [facility]

## 2023-01-18 NOTE — Progress Notes (Signed)
Progress Note   Patient: Gary [name] QMG:[id number] DOB: 14-May-1946 DOA: 01/12/2023     6 DOS: the patient was seen and examined on 01/18/2023   Brief hospital course: [name] is a 77 year old male with history of recurrent stage III mantle cell lymphoma who has completed 43 cycles of Rituxan and Treanda, depression, insulin-dependent diabetes mellitus, hypertension, hyperlipidemia, who presents to the emergency department for chief concerns of generalized weakness and hypotension.  Patient is reported to have nausea and vomiting and diarrhea and this has been ongoing for the last several months  Vitals in the ED showed temperature of 98, respiration rate of 16, heart rate of 108, blood pressure 88/60, SpO2 of 99% on room air.  Serum sodium is 134, potassium 3.5, chloride 101, bicarb 19, BUN of 37, serum creatinine of 2.46, EGFR of 27, nonfasting blood glucose 70, WBC 3.3, hemoglobin 11.3, platelets of 92.  High sensitive troponin is 27.  Lactic acid is 2.1.  Blood cultures x 2 are in process.  ED treatment: Ceftriaxone 2 g IV one-time dose, lactated ringer 1 L bolus, vancomycin.  4/3:Labs with slight worsening of metabolic acidosis, creatinine with mild improvement to 2.43, worsening leukopenia and thrombocytopenia but all cell lines decreased-some dilutional effect.  On imaging which include CT head, cervical spine, CT abdominal and pelvis were negative for any acute abnormality.  INR at 3.5-discontinue DVT prophylaxis with heparin-SCDs ordered. Patient again becoming hypotensive-give him 1 more liter of bolus and also starting midodrine. Still no obvious source of infection as blood cultures remain negative in 24 hours. Will continue broad-spectrum antibiotics.  Concern of lower extremity cellulitis.  4/4: Blood pressure remained on softer side, midodrine dose was increased to 10 mg, 3 times daily.  GI pathogen panel negative, C. difficile negative, Blood cultures remain negative.   Labs with very slowly improving creatinine to 2.33, worsening now anion gap metabolic acidosis with bicarb at 14, mildly elevated T. bili at 1.4.  Lactic acid was normal but found to have elevated beta hydroxybutyric acid, concern of developing DKA. Rechecking BMP if Remained open we will start him on Endo tool. INR still elevated, APTT started improving.  DIC labs negative.  Vitamin K levels pending  Addendum.  Repeat BMP remained high anion gap metabolic acidosis with elevated blood glucose level.  Patient seems like in DKA.  Transferring to stepdown and starting on Endo tool.  4/5: Blood pressure remained little soft.  CBG improved and gap closed.  [name]  He was later managed with resistant sliding scale every 4 hourly.  Potassium was also repleted overnight.  A.m. labs with some improvement of creatinine to 1.99, bicarb still low at 20-infusion was discontinued overnight and he is now being restarted on p.o. bicarb supplement.  TSH found to be elevated at 7.715-Home Synthroid dose was increased to 50. Esophagogram was ordered by GI today.  4/6: Blood pressure remained on softer side.  CBG improved, converted to SSI with meals along with 10 units of Smeglee.  Esophageal gram with limited study due to patient's inability to position properly but did show patulous esophagus with dysmotility and spontaneous reflex.  Potentially GEJ narrowing.  Will need EGD after improvement in current acute illness.  GI pathogen panel negative.  4/7: Patient slowly improving.  GI planning EGD tomorrow with Dr. Timothy Lassousso.  Awaiting SNF placement.  4/8: Hemodynamically stable.  EGD today shows mild narrowing of GEJ,  which was dilated with good results.  GI is recommending twice daily PPI and follow-up with primary GI as outpatient. Completed a course of antibiotics for concern of pneumonia.  Will continue on p.o. linezolid for total of 7  days.  Assessment and Plan: * Sepsis due to cellulitis Patient has elevated lactic acid of 2.1, leukopenia, elevated heart rate, low normal tensive, with possible source of infection, cellulitis of the right leg vs CAP Blood cultures remain negative, procalcitonin elevated.  Lactic acidosis resolved. Improving lower extremity cellulitis. Completed the course of ceftriaxone and Zithromax Continue linezolid for total of 7 days-switch to p.o.  Elevated INR INR elevated at 3.5 on admission which has been improved now.  Patient was not on Coumadin.  Was given heparin subcu for DVT prophylaxis.  Hepatic function with mildly low total protein and albumin and rest normal.  Also found to have significantly elevated APTT which started improving today.  DIC labs and peripheral smear negative.  Vitamin K levels pending.  Did receive 1 dose of IV vitamin K -Continue to monitor  Increased anion gap metabolic acidosis improving.  Insulin infusion ordered but never started and his blood glucose was managed with resistant sliding scale.  Bicarb infusion was discontinued overnight -Continue with p.o. bicarb now -Continue to monitor  AKI (acute kidney injury) With CKD 3B Baseline serum creatinine is 1.84-2.04/eGFR 34-38 Renal functions slowly improving -Monitor renal function -Avoid nephrotoxins  Essential hypertension, benign Blood pressure currently soft requiring fluid resuscitation. Started on midodrine  Hypothyroid TSH elevated at 7.7 -Increasing the dose of Synthroid to 50  Adjustment disorder with depressed mood Fluoxetine 20 mg daily resumed  Hypercholesterolemia Pravastatin 40 mg daily resumed  Mantle cell lymphoma Last chemo was in January 2024.  Being followed up by oncology as outpatient.  GERD (gastroesophageal reflux disease) Patient is having a lot of GERD issues and spitting out most of the medications. -GI was consulted for abnormal esophageal gram.  EGD with benign  esophageal stenosis and narrowing of GEJ junction which was dilated -PPI twice daily -Follow-up with primary GI as outpatient-avoid GI signed off after the procedure   Diabetes CBG today within goal.  There was concern of DKA with high anion gap metabolic acidosis with hyperglycemia yesterday. -Continue with SSI      Subjective: Patient continued to improve slowly.  No new concern.  Awaiting EGD.  Physical Exam: Vitals:   01/18/23 0803 01/18/23 1200 01/18/23 1231 01/18/23 1251  BP: (!) 150/127 (!) 127/59 (!) 111/50 (!) 94/49  Pulse: 69 61    Resp: 20 16    Temp: 97.8 F (36.6 C) (!) 97.3 F (36.3 C) 98.3 F (36.8 C)   TempSrc:   Temporal   SpO2: 98% 99%    Weight:      Height:       General.  Frail elderly man, in no acute distress. Pulmonary.  Lungs clear bilaterally, normal respiratory effort. CV.  Regular rate and rhythm, no JVD, rub or murmur. Abdomen.  Soft, nontender, nondistended, BS positive. CNS.  Alert and oriented .  No focal neurologic deficit. Extremities.  Bilateral LE Ace wrap, no erythema Psychiatry.  Judgment and insight appears normal.   Data Reviewed: Prior data reviewed.  Family Communication: Talked with sister on phone.  Disposition: Status is: Inpatient Remains inpatient appropriate because: Severity of illness  Planned Discharge Destination: SNF  DVT prophylaxis.  SCDs Time spent: 40 minutes  This record has been created using Conservation officer, historic buildings. Errors have  been sought and corrected,but may not always be located. Such creation errors do not reflect on the standard of care.   Author: Arnetha Courser, MD 01/18/2023 2:01 PM  For on call review [email or web address].

## 2023-01-19 ENCOUNTER — Encounter: Payer: Self-pay | Admitting: Gastroenterology

## 2023-01-19 DIAGNOSIS — L0390 Cellulitis, unspecified: Secondary | ICD-10-CM | POA: Diagnosis not present

## 2023-01-19 DIAGNOSIS — N179 Acute kidney failure, unspecified: Secondary | ICD-10-CM | POA: Diagnosis not present

## 2023-01-19 DIAGNOSIS — E039 Hypothyroidism, unspecified: Secondary | ICD-10-CM | POA: Diagnosis not present

## 2023-01-19 DIAGNOSIS — I10 Essential (primary) hypertension: Secondary | ICD-10-CM | POA: Diagnosis not present

## 2023-01-19 LAB — GLUCOSE, CAPILLARY
Glucose-Capillary: 96 mg/dL (ref 70–99)
Glucose-Capillary: 66 mg/dL — ABNORMAL LOW (ref 70–99)
Glucose-Capillary: 193 mg/dL — ABNORMAL HIGH (ref 70–99)
Glucose-Capillary: 195 mg/dL — ABNORMAL HIGH (ref 70–99)
Glucose-Capillary: 134 mg/dL — ABNORMAL HIGH (ref 70–99)

## 2023-01-19 MED ORDER — LOPERAMIDE HCL 2 MG PO CAPS
2.0000 mg | ORAL_CAPSULE | ORAL | Status: DC | PRN
  Administered 2023-01-20 – 2023-01-25 (×9): 2 mg via ORAL
  Filled 2023-01-19 (×9): qty 1

## 2023-01-19 NOTE — Plan of Care (Signed)
  Problem: Fluid Volume: Goal: Hemodynamic stability will improve Outcome: Progressing   Problem: Clinical Measurements: Goal: Diagnostic test results will improve Outcome: Progressing Goal: Signs and symptoms of infection will decrease Outcome: Progressing   Problem: Respiratory: Goal: Ability to maintain adequate ventilation will improve Outcome: Progressing   Problem: Education: Goal: Knowledge of General Education information will improve Description: Including pain rating scale, medication(s)/side effects and non-pharmacologic comfort measures Outcome: Progressing   Problem: Health Behavior/Discharge Planning: Goal: Ability to manage health-related needs will improve Outcome: Progressing   Problem: Clinical Measurements: Goal: Ability to maintain clinical measurements within normal limits will improve Outcome: Progressing Goal: Will remain free from infection Outcome: Progressing Goal: Diagnostic test results will improve Outcome: Progressing Goal: Respiratory complications will improve Outcome: Progressing Goal: Cardiovascular complication will be avoided Outcome: Progressing   Problem: Activity: Goal: Risk for activity intolerance will decrease Outcome: Progressing   Problem: Nutrition: Goal: Adequate nutrition will be maintained Outcome: Progressing   Problem: Coping: Goal: Level of anxiety will decrease Outcome: Progressing   Problem: Elimination: Goal: Will not experience complications related to bowel motility Outcome: Progressing Goal: Will not experience complications related to urinary retention Outcome: Progressing   Problem: Pain Managment: Goal: General experience of comfort will improve Outcome: Progressing   Problem: Safety: Goal: Ability to remain free from injury will improve Outcome: Progressing   Problem: Skin Integrity: Goal: Risk for impaired skin integrity will decrease Outcome: Progressing   Problem: Education: Goal: Ability  to describe self-care measures that may prevent or decrease complications (Diabetes Survival Skills Education) will improve Outcome: Progressing Goal: Individualized Educational Video(s) Outcome: Progressing   Problem: Coping: Goal: Ability to adjust to condition or change in health will improve Outcome: Progressing   Problem: Fluid Volume: Goal: Ability to maintain a balanced intake and output will improve Outcome: Progressing   Problem: Health Behavior/Discharge Planning: Goal: Ability to identify and utilize available resources and services will improve Outcome: Progressing Goal: Ability to manage health-related needs will improve Outcome: Progressing   Problem: Metabolic: Goal: Ability to maintain appropriate glucose levels will improve Outcome: Progressing   Problem: Nutritional: Goal: Maintenance of adequate nutrition will improve Outcome: Progressing Goal: Progress toward achieving an optimal weight will improve Outcome: Progressing   Problem: Skin Integrity: Goal: Risk for impaired skin integrity will decrease Outcome: Progressing   Problem: Tissue Perfusion: Goal: Adequacy of tissue perfusion will improve Outcome: Progressing   Problem: Education: Goal: Ability to describe self-care measures that may prevent or decrease complications (Diabetes Survival Skills Education) will improve Outcome: Progressing Goal: Individualized Educational Video(s) Outcome: Progressing   Problem: Cardiac: Goal: Ability to maintain an adequate cardiac output will improve Outcome: Progressing   Problem: Health Behavior/Discharge Planning: Goal: Ability to identify and utilize available resources and services will improve Outcome: Progressing Goal: Ability to manage health-related needs will improve Outcome: Progressing   Problem: Fluid Volume: Goal: Ability to achieve a balanced intake and output will improve Outcome: Progressing   Problem: Metabolic: Goal: Ability to  maintain appropriate glucose levels will improve Outcome: Progressing   Problem: Nutritional: Goal: Maintenance of adequate nutrition will improve Outcome: Progressing Goal: Maintenance of adequate weight for body size and type will improve Outcome: Progressing   Problem: Respiratory: Goal: Will regain and/or maintain adequate ventilation Outcome: Progressing   Problem: Urinary Elimination: Goal: Ability to achieve and maintain adequate renal perfusion and functioning will improve Outcome: Progressing   

## 2023-01-19 NOTE — Inpatient Diabetes Management (Signed)
Inpatient Diabetes Program Recommendations  AACE/ADA: New Consensus Statement on Inpatient Glycemic Control (2015)  Target Ranges:  Prepandial:   less than 140 mg/dL      Peak postprandial:   less than 180 mg/dL (1-2 hours)      Critically ill patients:  140 - 180 mg/dL    Latest Reference Range & Units 01/18/23 08:04 01/18/23 16:02 01/18/23 19:51  Glucose-Capillary 70 - 99 mg/dL 628 (H)  3 units Novolog  180 (H)  6 units Novolog  141 (H)   10 units Semglee @2148   (H): Data is abnormally high  Latest Reference Range & Units 01/19/23 08:31  Glucose-Capillary 70 - 99 mg/dL 66 (L)  (L): Data is abnormally low    Diabetes history: DM1 (does not make insulin) Needs correction, basal and meal coverage   Outpatient Diabetes medications:  Metronic Insulin Pump   Current Orders: Semglee 10 QHS  Novolog 0-15 units TID ac/hs  Novolog 3 units TID with meals       MD- Note Hypoglycemia this AM (CBG 66).  Was NPO for several hours yesterday.  May consider the following:  1. Reduce the Novolog SSI to the 0-9 unit Sensitive scale  2. Reduce the Semglee slightly to 8 units QHS     --Will follow patient during hospitalization--   Ambrose Finland RN, MSN, CDCES Diabetes Coordinator Inpatient Glycemic Control Team Team Pager: [phone] (8a-5p)

## 2023-01-19 NOTE — Progress Notes (Signed)
Progress Note   Patient: Gary [name] TMY:[id number] DOB: 07/03/1946 DOA: 01/12/2023     7 DOS: the patient was seen and examined on 01/19/2023   Brief hospital course: [name] is a 77 year old male with history of recurrent stage III mantle cell lymphoma who has completed 43 cycles of Rituxan and Treanda, depression, insulin-dependent diabetes mellitus, hypertension, hyperlipidemia, who presents to the emergency department for chief concerns of generalized weakness and hypotension.  Patient is reported to have nausea and vomiting and diarrhea and this has been ongoing for the last several months  Vitals in the ED showed temperature of 98, respiration rate of 16, heart rate of 108, blood pressure 88/60, SpO2 of 99% on room air.  Serum sodium is 134, potassium 3.5, chloride 101, bicarb 19, BUN of 37, serum creatinine of 2.46, EGFR of 27, nonfasting blood glucose 70, WBC 3.3, hemoglobin 11.3, platelets of 92.  High sensitive troponin is 27.  Lactic acid is 2.1.  Blood cultures x 2 are in process.  ED treatment: Ceftriaxone 2 g IV one-time dose, lactated ringer 1 L bolus, vancomycin.  4/3:Labs with slight worsening of metabolic acidosis, creatinine with mild improvement to 2.43, worsening leukopenia and thrombocytopenia but all cell lines decreased-some dilutional effect.  On imaging which include CT head, cervical spine, CT abdominal and pelvis were negative for any acute abnormality.  INR at 3.5-discontinue DVT prophylaxis with heparin-SCDs ordered. Patient again becoming hypotensive-give him 1 more liter of bolus and also starting midodrine. Still no obvious source of infection as blood cultures remain negative in 24 hours. Will continue broad-spectrum antibiotics.  Concern of lower extremity cellulitis.  4/4: Blood pressure remained on softer side, midodrine dose was increased to 10 mg, 3 times daily.  GI pathogen panel negative, C. difficile negative, Blood cultures remain negative.   Labs with very slowly improving creatinine to 2.33, worsening now anion gap metabolic acidosis with bicarb at 14, mildly elevated T. bili at 1.4.  Lactic acid was normal but found to have elevated beta hydroxybutyric acid, concern of developing DKA. Rechecking BMP if Remained open we will start him on Endo tool. INR still elevated, APTT started improving.  DIC labs negative.  Vitamin K levels pending  Addendum.  Repeat BMP remained high anion gap metabolic acidosis with elevated blood glucose level.  Patient seems like in DKA.  Transferring to stepdown and starting on Endo tool.  4/5: Blood pressure remained little soft.  CBG improved and gap closed.  [name]  He was later managed with resistant sliding scale every 4 hourly.  Potassium was also repleted overnight.  A.m. labs with some improvement of creatinine to 1.99, bicarb still low at 20-infusion was discontinued overnight and he is now being restarted on p.o. bicarb supplement.  TSH found to be elevated at 7.715-Home Synthroid dose was increased to 50. Esophagogram was ordered by GI today.  4/6: Blood pressure remained on softer side.  CBG improved, converted to SSI with meals along with 10 units of Smeglee.  Esophageal gram with limited study due to patient's inability to position properly but did show patulous esophagus with dysmotility and spontaneous reflex.  Potentially GEJ narrowing.  Will need EGD after improvement in current acute illness.  GI pathogen panel negative.  4/7: Patient slowly improving.  GI planning EGD tomorrow with Dr. [name].  Awaiting SNF placement.  4/8: Hemodynamically stable.  EGD today shows mild narrowing of GEJ,  which was dilated with good results.  GI is recommending twice daily PPI and follow-up with primary GI as outpatient. Completed a course of antibiotics for concern of pneumonia.  Will continue on p.o. linezolid for total of 7  days.  4/9:Vitals stable.  Had 1 episode of hypoglycemia with CBG at 66 this morning.  Patient was n.p.o. most of the day yesterday.  Improved p.o. intake and regurgitation today. Awaiting SNF placement  Assessment and Plan: * Sepsis due to cellulitis Patient has elevated lactic acid of 2.1, leukopenia, elevated heart rate, low normal tensive, with possible source of infection, cellulitis of the right leg vs CAP Blood cultures remain negative, procalcitonin elevated.  Lactic acidosis resolved. Improving lower extremity cellulitis. Completed the course of ceftriaxone and Zithromax Continue linezolid for total of 7 days-switch to p.o.  Elevated INR INR elevated at 3.5 on admission which has been improved now.  Patient was not on Coumadin.  Was given heparin subcu for DVT prophylaxis.  Hepatic function with mildly low total protein and albumin and rest normal.  Also found to have significantly elevated APTT which started improving today.  DIC labs and peripheral smear negative.  Vitamin K levels pending.  Did receive 1 dose of IV vitamin K -Continue to monitor  Increased anion gap metabolic acidosis improving.  Insulin infusion ordered but never started and his blood glucose was managed with resistant sliding scale.  Bicarb infusion was discontinued overnight -Continue with p.o. bicarb now -Continue to monitor  AKI (acute kidney injury) With CKD 3B Baseline serum creatinine is 1.84-2.04/eGFR 34-38 Renal functions slowly improving -Monitor renal function -Avoid nephrotoxins  Essential hypertension, benign Blood pressure currently soft requiring fluid resuscitation. Started on midodrine  Hypothyroid TSH elevated at 7.7 -Increasing the dose of Synthroid to 50  Adjustment disorder with depressed mood Fluoxetine 20 mg daily resumed  Hypercholesterolemia Pravastatin 40 mg daily resumed  Mantle cell lymphoma Last chemo was in January 2024.  Being followed up by oncology as  outpatient.  GERD (gastroesophageal reflux disease) Patient is having a lot of GERD issues and spitting out most of the medications. -GI was consulted for abnormal esophageal gram.  EGD with benign esophageal stenosis and narrowing of GEJ junction which was dilated -PPI twice daily -Follow-up with primary GI as outpatient-avoid GI signed off after the procedure   Diabetes CBG today within goal.  There was concern of DKA with high anion gap metabolic acidosis with hyperglycemia yesterday. -Continue with SSI      Subjective: Patient with improved p.o. intake today.  No significant regurg.  Slowly improving.  Physical Exam: Vitals:   01/18/23 2315 01/19/23 0323 01/19/23 0831 01/19/23 1123  BP: (!) 108/55 (!) 100/54 106/62 (!) 122/51  Pulse: (!) 59 65 70 73  Resp: 18 18 18 20   Temp: 98.3 F (36.8 C) 98.1 F (36.7 C) 98.1 F (36.7 C) 98 F (36.7 C)  TempSrc: Oral Oral Oral   SpO2: 98% 98% 99% 97%  Weight:      Height:       General.  Frail elderly man, in no acute distress. Pulmonary.  Lungs clear bilaterally, normal respiratory effort. CV.  Regular rate and rhythm, no JVD, rub or murmur. Abdomen.  Soft, nontender, nondistended, BS positive. CNS.  Alert and oriented .  No focal neurologic deficit. Extremities.  Bilateral LE Ace wrap, no erythema Psychiatry.  Judgment and insight appears normal.   Data Reviewed: Prior data reviewed.  Family Communication: Talked with sister on phone.  Disposition:  Status is: Inpatient Remains inpatient appropriate because: Severity of illness  Planned Discharge Destination: SNF  DVT prophylaxis.  SCDs Time spent: 39 minutes  This record has been created using Conservation officer, historic buildings. Errors have been sought and corrected,but may not always be located. Such creation errors do not reflect on the standard of care.   Author: Arnetha Courser, MD 01/19/2023 1:01 PM  For on call review [email or web address].

## 2023-01-19 NOTE — Telephone Encounter (Signed)
Gary [name], patient will have to contact [facility] since he had a colonoscopy done with Dr. [name] yesterday. Please call patient to let her know. Thank you.

## 2023-01-19 NOTE — Progress Notes (Signed)
Nutrition Follow-up  DOCUMENTATION CODES:   Not applicable  INTERVENTION:   -Continue NSA MIghty Shake TID, each supplement provides 200 kcals and 7 grams protein -Continue Magic cup TID with meals, each supplement provides 290 kcal and 9 grams of protein  -Continue MVI with minerals daily  NUTRITION DIAGNOSIS:   Inadequate oral intake related to poor appetite as evidenced by percent weight loss, meal completion < 25%.  Ongoing  GOAL:   Patient will meet greater than or equal to 90% of their needs  Progressing   MONITOR:   PO intake, Supplement acceptance  REASON FOR ASSESSMENT:   Malnutrition Screening Tool    ASSESSMENT:   Pt with history of recurrent stage III mantle cell lymphoma who has completed 43 cycles of Rituxan and Treanda, depression, insulin-dependent diabetes mellitus, hypertension, hyperlipidemia, who presents for chief concerns of generalized weakness and hypotension.  4/4- protonix started for regurgitation 4/8- s/p EGD- revealed small hiatal hernia, gastritis, benign appearing esophageal stenosis (dilated), and abnormal esophageal motility (suspicious for presbyesophagus)  Reviewed I/O's: +471 ml x 24 hours and +4.3 L since admission  UOP: 850 ml x 24 hours   Pt sleeping soundly at time of visit. Pt did not respond to name being called. Noted he consumed about 75% of milk carton on tray table.   Pt currently on a full liquid diet. Oral intake improving. Noted meal completions 50%.   Per MD notes, pt awaiting SNF placement.   Labs reviewed: CBGS: 66-193 (inpatient orders for glycemic control are 0-15 units insulin aspart TID with meals, 3 units insulin aspart TID with meals, and 10 units inuslin glargine-yfgn daily).    Diet Order:   Diet Order             Diet full liquid Room service appropriate? No; Fluid consistency: Thin  Diet effective now                   EDUCATION NEEDS:   Education needs have been addressed  Skin:  Skin  Assessment: Skin Integrity Issues: Skin Integrity Issues:: DTI DTI: coccyx Stage II: rt buttocks  Last BM:  01/12/23  Height:   Ht Readings from Last 1 Encounters:  01/14/23 5\' 10"  (1.778 m)    Weight:   Wt Readings from Last 1 Encounters:  01/14/23 99.2 kg    Ideal Body Weight:  75.5 kg  BMI:  Body mass index is 31.38 kg/m.  Estimated Nutritional Needs:   Kcal:  2050-2250  Protein:  115-130 grams  Fluid:  > 2 L    Levada Schilling, RD, LDN, CDCES Registered Dietitian II Certified Diabetes Care and Education Specialist Please refer to [facility] for RD and/or RD on-call/weekend/after hours pager

## 2023-01-19 NOTE — TOC Progression Note (Signed)
Transition of Care [facility]) - Progression Note    Patient Details  Name: Gary [name] MRN: [id number] Date of Birth: 04-13-1946  Transition [facility]) CM/SW Contact  Truddie Hidden, RN Phone Number: 01/19/2023, 3:16 PM  Clinical Narrative:    Spoke with patient to give bed offer for [facility]. He would like to stay in [facility]. He was advised offer was pending for [facility]. He is agreeable to [facility] if they will offer.   Attempt to reach Tiffany in admissions at [facility]. No answer. Left a message regarding bed offer.    Expected Discharge Plan:  (Pending Medical Workup) Barriers to Discharge: No Barriers Identified  Expected Discharge Plan and Services   Discharge Planning Services: CM Consult   Living arrangements for the past 2 months: Single Family Home                   DME Agency:  (Patient has a rolling walker and a single point cane at home.)                   Social Determinants of Health (SDOH) Interventions SDOH Screenings   Food Insecurity: No Food Insecurity (01/12/2023)  Housing: Low Risk  (01/12/2023)  Transportation Needs: No Transportation Needs (01/12/2023)  Recent Concern: Transportation Needs - Unmet Transportation Needs (01/11/2023)  Utilities: Not At Risk (01/12/2023)  Alcohol Screen: Low Risk  (11/27/2020)  Depression (PHQ2-9): High Risk (01/12/2023)  Financial Resource Strain: Low Risk  (03/10/2022)  Physical Activity: Inactive (03/10/2022)  Social Connections: Socially Isolated (03/10/2022)  Stress: No Stress Concern Present (03/10/2022)  Tobacco Use: High Risk (01/19/2023)    Readmission Risk Interventions    01/15/2023   11:37 AM 08/30/2021   10:48 AM  Readmission Risk Prevention Plan  Transportation Screening Complete Complete  PCP or Specialist Appt within 3-5 Days Complete Complete  HRI or Home Care Consult Complete Complete  Social Work Consult for Recovery Care Planning/Counseling Complete Complete  Palliative  Care Screening Not Applicable Not Applicable  Medication Review Oceanographer) Complete Complete

## 2023-01-19 NOTE — Progress Notes (Signed)
Physical Therapy Treatment Patient Details Name: Gary [name] MRN: [id number] DOB: [birth year] Today's Date: 01/19/2023   History of Present Illness Gary [name] is a 77 y/o male with PMH of lymphoma, depression, DM2, HTN, COPD, HLD. Admitted with sepsis; weakness, N/V, diarrhea.    PT Comments    Pt resting in bed upon PT arrival; agreeable to therapy.  During session pt 1-2 assist with bed mobility; mod to max assist to stand from elevated bed height up to RW (x2 trials); and mod assist to side step to R along bed a couple feet with RW use.  Assist required for standing balance (pt tending to have posterior weight shift).  Pt requiring pacing and rest breaks d/t fatigue.  Will continue to focus on strengthening, balance, and progressive functional mobility per pt tolerance.    Recommendations for follow up therapy are one component of a multi-disciplinary discharge planning process, led by the attending physician.  Recommendations may be updated based on patient status, additional functional criteria and insurance authorization.  Follow Up Recommendations  Can patient physically be transported by private vehicle: No    Assistance Recommended at Discharge Frequent or constant Supervision/Assistance  Patient can return home with the following Two people to help with walking and/or transfers;A lot of help with bathing/dressing/bathroom;Assistance with cooking/housework;Assist for transportation;Help with stairs or ramp for entrance   Equipment Recommendations   (TBD at next venue of care)    Recommendations for Other Services       Precautions / Restrictions Precautions Precautions: Fall Restrictions Weight Bearing Restrictions: No     Mobility  Bed Mobility Overal bed mobility: Needs Assistance Bed Mobility: Supine to Sit, Sit to Supine     Supine to sit: Mod assist, HOB elevated (assist for trunk) Sit to supine: +2 for physical assistance (assist for trunk and B LE's; 2 assist  to scoot up in bed using bed sheet)   General bed mobility comments: vc's for technique    Transfers Overall transfer level: Needs assistance Equipment used: Rolling walker (2 wheels) Transfers: Sit to/from Stand Sit to Stand: Mod assist, Max assist, From elevated surface           General transfer comment: x2 trials standing; mod to max assist to stand up to RW and control descent sitting; bed height elevated    Ambulation/Gait Ambulation/Gait assistance: Mod assist Gait Distance (Feet):  (pt side stepped to R along bed a couple feet with RW use) Assistive device: Rolling walker (2 wheels)   Gait velocity: decreased     General Gait Details: increased effort/time to take steps laterally to R along bed; limited d/t pt fatigue   Stairs             Wheelchair Mobility    Modified Rankin (Stroke Patients Only)       Balance Overall balance assessment: Needs assistance   Sitting balance-Leahy Scale: Fair Sitting balance - Comments: steady static sitting   Standing balance support: Bilateral upper extremity supported Standing balance-Leahy Scale: Poor Standing balance comment: assist for balance (assist to keep weight forward)                            Cognition Arousal/Alertness: Awake/alert Behavior During Therapy: WFL for tasks assessed/performed Overall Cognitive Status: Within Functional Limits for tasks assessed  Exercises      General Comments  Nursing cleared pt for participation in physical therapy.  Pt agreeable to PT session.      Pertinent Vitals/Pain Pain Assessment Pain Assessment: Faces Faces Pain Scale: Hurts a little bit Pain Location: knees when standing Pain Descriptors / Indicators: Sore Pain Intervention(s): Limited activity within patient's tolerance, Monitored during session, Repositioned Vitals (HR and O2 on room air) stable and WFL throughout treatment  session.    Home Living                          Prior Function            PT Goals (current goals can now be found in the care plan section) Acute Rehab PT Goals Patient Stated Goal: get strength back PT Goal Formulation: With patient Time For Goal Achievement: 01/29/23 Potential to Achieve Goals: Fair Progress towards PT goals: Progressing toward goals    Frequency    Min 3X/week      PT Plan Current plan remains appropriate    Co-evaluation              AM-PAC PT "6 Clicks" Mobility   Outcome Measure  Help needed turning from your back to your side while in a flat bed without using bedrails?: A Lot Help needed moving from lying on your back to sitting on the side of a flat bed without using bedrails?: A Lot Help needed moving to and from a bed to a chair (including a wheelchair)?: A Lot Help needed standing up from a chair using your arms (e.g., wheelchair or bedside chair)?: A Lot Help needed to walk in hospital room?: Total Help needed climbing 3-5 steps with a railing? : Total 6 Click Score: 10    End of Session Equipment Utilized During Treatment: Gait belt Activity Tolerance: Patient limited by fatigue Patient left: in bed;with call bell/phone within reach;with bed alarm set;with nursing/sitter in room;Other (comment) (B heels floating via pillow support; NT present) Nurse Communication: Mobility status;Precautions PT Visit Diagnosis: Muscle weakness (generalized) (M62.81);Difficulty in walking, not elsewhere classified (R26.2);Unsteadiness on feet (R26.81)     Time: 1547-1610 PT Time Calculation (min) (ACUTE ONLY): 23 min  Charges:  $Therapeutic Activity: 23-37 mins                     Hendricks Limes, PT 01/19/23, 4:29 PM

## 2023-01-19 NOTE — Telephone Encounter (Signed)
Patient is currently in the hospital. I will wait for when patient gets discharged and I will give the option to follow up with [facility] or if they really want to establish care with our office then I will schedule the patient an appointment (per Ginger).

## 2023-01-20 ENCOUNTER — Encounter (INDEPENDENT_AMBULATORY_CARE_PROVIDER_SITE_OTHER): Payer: Self-pay

## 2023-01-20 ENCOUNTER — Encounter (INDEPENDENT_AMBULATORY_CARE_PROVIDER_SITE_OTHER): Payer: Medicare Other

## 2023-01-20 ENCOUNTER — Ambulatory Visit (INDEPENDENT_AMBULATORY_CARE_PROVIDER_SITE_OTHER): Payer: Medicare Other | Admitting: Nurse Practitioner

## 2023-01-20 DIAGNOSIS — C8318 Mantle cell lymphoma, lymph nodes of multiple sites: Secondary | ICD-10-CM | POA: Diagnosis not present

## 2023-01-20 DIAGNOSIS — N179 Acute kidney failure, unspecified: Secondary | ICD-10-CM | POA: Diagnosis not present

## 2023-01-20 DIAGNOSIS — Z7189 Other specified counseling: Secondary | ICD-10-CM | POA: Diagnosis not present

## 2023-01-20 DIAGNOSIS — Z515 Encounter for palliative care: Secondary | ICD-10-CM | POA: Diagnosis not present

## 2023-01-20 DIAGNOSIS — K219 Gastro-esophageal reflux disease without esophagitis: Secondary | ICD-10-CM | POA: Diagnosis not present

## 2023-01-20 DIAGNOSIS — L0390 Cellulitis, unspecified: Secondary | ICD-10-CM | POA: Diagnosis not present

## 2023-01-20 DIAGNOSIS — E1151 Type 2 diabetes mellitus with diabetic peripheral angiopathy without gangrene: Secondary | ICD-10-CM | POA: Diagnosis not present

## 2023-01-20 LAB — GLUCOSE, CAPILLARY
Glucose-Capillary: 68 mg/dL — ABNORMAL LOW (ref 70–99)
Glucose-Capillary: 67 mg/dL — ABNORMAL LOW (ref 70–99)
Glucose-Capillary: 92 mg/dL (ref 70–99)
Glucose-Capillary: 133 mg/dL — ABNORMAL HIGH (ref 70–99)
Glucose-Capillary: 95 mg/dL (ref 70–99)
Glucose-Capillary: 133 mg/dL — ABNORMAL HIGH (ref 70–99)
Glucose-Capillary: 117 mg/dL — ABNORMAL HIGH (ref 70–99)
Glucose-Capillary: 118 mg/dL — ABNORMAL HIGH (ref 70–99)

## 2023-01-20 LAB — BASIC METABOLIC PANEL
Anion gap: 5 (ref 5–15)
BUN: 12 mg/dL (ref 8–23)
CO2: 28 mmol/L (ref 22–32)
Calcium: 7.6 mg/dL — ABNORMAL LOW (ref 8.9–10.3)
Chloride: 108 mmol/L (ref 98–111)
Creatinine, Ser: 0.96 mg/dL (ref 0.61–1.24)
GFR, Estimated: >60 mL/min (ref >60)
Glucose, Bld: 108 mg/dL — ABNORMAL HIGH (ref 70–99)
Potassium: 4.2 mmol/L (ref 3.5–5.1)
Sodium: 141 mmol/L (ref 135–145)

## 2023-01-20 LAB — CBC
HCT: 33 % — ABNORMAL LOW (ref 39.0–52.0)
Hemoglobin: 10.5 g/dL — ABNORMAL LOW (ref 13.0–17.0)
MCH: 30 pg (ref 26.0–34.0)
MCHC: 31.8 g/dL (ref 30.0–36.0)
MCV: 94.3 fL (ref 80.0–100.0)
Platelets: 60 10*3/uL — ABNORMAL LOW (ref 150–400)
RBC: 3.5 MIL/uL — ABNORMAL LOW (ref 4.22–5.81)
RDW: 14.6 % (ref 11.5–15.5)
WBC: 3 10*3/uL — ABNORMAL LOW (ref 4.0–10.5)
nRBC: 0 % (ref 0.0–0.2)

## 2023-01-20 MED ORDER — INSULIN GLARGINE-YFGN 100 UNIT/ML ~~LOC~~ SOLN
8.0000 [IU] | Freq: Every day | SUBCUTANEOUS | Status: DC
  Administered 2023-01-21: 8 [IU] via SUBCUTANEOUS
  Filled 2023-01-20 (×2): qty 0.08

## 2023-01-20 NOTE — Assessment & Plan Note (Signed)
Blood pressure currently soft requiring midodrine

## 2023-01-20 NOTE — Progress Notes (Signed)
Progress Note   Patient: Gary [name] DOB: 08/28/1946 DOA: 01/12/2023     8 DOS: the patient was seen and examined on 01/20/2023   Brief hospital course: [name] is a 77 year old male with history of recurrent stage III mantle cell lymphoma who has completed 43 cycles of Rituxan and Treanda, depression, insulin-dependent diabetes mellitus, hypertension, hyperlipidemia, who presents to the emergency department for chief concerns of generalized weakness and hypotension.  Patient is reported to have nausea and vomiting and diarrhea and this has been ongoing for the last several months  Vitals in the ED showed temperature of 98, respiration rate of 16, heart rate of 108, blood pressure 88/60, SpO2 of 99% on room air.  Serum sodium is 134, potassium 3.5, chloride 101, bicarb 19, BUN of 37, serum creatinine of 2.46, EGFR of 27, nonfasting blood glucose 70, WBC 3.3, hemoglobin 11.3, platelets of 92.  High sensitive troponin is 27.  Lactic acid is 2.1.  Blood cultures x 2 are in process.  ED treatment: Ceftriaxone 2 g IV one-time dose, lactated ringer 1 L bolus, vancomycin.  4/3:Labs with slight worsening of metabolic acidosis, creatinine with mild improvement to 2.43, worsening leukopenia and thrombocytopenia but all cell lines decreased-some dilutional effect.  On imaging which include CT head, cervical spine, CT abdominal and pelvis were negative for any acute abnormality.  INR at 3.5-discontinue DVT prophylaxis with heparin-SCDs ordered. Patient again becoming hypotensive-give him 1 more liter of bolus and also starting midodrine. Still no obvious source of infection as blood cultures remain negative in 24 hours. Will continue broad-spectrum antibiotics.  Concern of lower extremity cellulitis.  4/4: Blood pressure remained on softer side, midodrine dose was increased to 10 mg, 3 times daily.  GI pathogen panel negative, C. difficile negative, Blood cultures remain negative.   Labs with very slowly improving creatinine to 2.33, worsening now anion gap metabolic acidosis with bicarb at 14, mildly elevated T. bili at 1.4.  Lactic acid was normal but found to have elevated beta hydroxybutyric acid, concern of developing DKA. Rechecking BMP if Remained open we will start him on Endo tool. INR still elevated, APTT started improving.  DIC labs negative.  Vitamin K levels pending  Addendum.  Repeat BMP remained high anion gap metabolic acidosis with elevated blood glucose level.  Patient seems like in DKA.  Transferring to stepdown and starting on Endo tool.  4/5: Blood pressure remained little soft.  CBG improved and gap closed.  [name]  He was later managed with resistant sliding scale every 4 hourly.  Potassium was also repleted overnight.  A.m. labs with some improvement of creatinine to 1.99, bicarb still low at 20-infusion was discontinued overnight and he is now being restarted on p.o. bicarb supplement.  TSH found to be elevated at 7.715-Home Synthroid dose was increased to 50. Esophagogram was ordered by GI today.  4/6: Blood pressure remained on softer side.  CBG improved, converted to SSI with meals along with 10 units of Smeglee.  Esophageal gram with limited study due to patient's inability to position properly but did show patulous esophagus with dysmotility and spontaneous reflex.  Potentially GEJ narrowing.  Will need EGD after improvement in current acute illness.  GI pathogen panel negative.  4/7: Patient slowly improving.  GI planning EGD tomorrow with Dr. [name].  Awaiting SNF placement.  4/8: Hemodynamically stable.  EGD today shows mild narrowing of GEJ,  which was dilated with good results.  GI is recommending twice daily PPI and follow-up with primary GI as outpatient. Completed a course of antibiotics for concern of pneumonia.  Will continue on p.o. linezolid for total of 7  days.  4/9:Vitals stable.  Had 1 episode of hypoglycemia with CBG at 66 this morning.  Patient was n.p.o. most of the day yesterday.  Improved p.o. intake and regurgitation today. 4/10 : Nursing reported loose watery diarrhea last evening but upon further discussion with patient he has been having ongoing diarrhea for several months as well as dysphagia and associated weight loss due to poor p.o. intake.  Holding off C. difficile testing for now per discussion with ID. awaiting SNF placement.  Palliative care consult.  Stopping Zyvox  Assessment and Plan: * Sepsis due to cellulitis Patient has elevated lactic acid of 2.1, leukopenia, elevated heart rate, low normal tensive, with possible source of infection, cellulitis of the right leg vs CAP Blood cultures remain negative, procalcitonin elevated.  Lactic acidosis resolved. Improving lower extremity cellulitis. Completed the course of ceftriaxone and Zithromax Treated also with Zyvox.  Will stop this for now  Elevated INR INR elevated at 3.5 on admission which has been improved now.  Patient was not on Coumadin.  Was given heparin subcu for DVT prophylaxis.  Hepatic function with mildly low total protein and albumin and rest normal.  Also found to have significantly elevated APTT which started improving today.  DIC labs and peripheral smear negative. Did receive 1 dose of IV vitamin K   Increased anion gap metabolic acidosis Present initially but now resolved with treatment  AKI (acute kidney injury) With CKD 3B -at baseline now  Lab Results  Component Value Date   CREATININE 0.96 01/20/2023   CREATININE 1.03 01/18/2023   CREATININE 1.40 (H) 01/16/2023     Essential hypertension, benign Blood pressure currently soft requiring midodrine  Hypothyroid TSH elevated at 7.7 -Increased the dose of Synthroid to 50 this admission  Adjustment disorder with depressed mood Fluoxetine 20 mg daily resumed  Hypercholesterolemia Continue  statin  Mantle cell lymphoma Last chemo was in January 2024.  Being followed up by oncology as outpatient, palliative care consult  GERD (gastroesophageal reflux disease) Patient is having a lot of GERD issues and spitting out most of the medications. -GI was consulted for abnormal esophageal gram.  EGD with benign esophageal stenosis and narrowing of GEJ junction which was dilated -PPI twice daily -Follow-up with primary GI as outpatient-patient reports ongoing dysphagia.  Poor p.o. intake and chronic diarrhea with associated weight loss   Diabetes CBG running low.  Will cut back on insulin Semglee from 10 -> 8 units and stop meal coverage as he is not eating much -Continue with SSI        Subjective: No new complaints.  Physical Exam: Vitals:   01/20/23 0537 01/20/23 0814 01/20/23 1120 01/20/23 1558  BP: 115/72 118/64 124/64 127/60  Pulse: 70 61 (!) 59 64  Resp:  18 18 16   Temp: 97.7 F (36.5 C) (!) 97.5 F (36.4 C) 97.8 F (36.6 C) 98.2 F (36.8 C)  TempSrc: Oral Oral    SpO2: 97% 100% 100% 100%  Weight: 102.1 kg     Height:       General.  Frail elderly man, in no acute distress. Pulmonary.  Lungs clear bilaterally, normal respiratory effort. CV.  Regular rate and rhythm, no JVD, rub or murmur. Abdomen.  Soft, nontender, nondistended, BS positive. CNS.  Alert and oriented .  No focal neurologic deficit. Extremities.  Bilateral LE Ace wrap, no erythema Psychiatry.  Judgment and insight appears normal.  Data Reviewed:  Platelets 60  Family Communication: None at bedside  Disposition: Status is: Inpatient Remains inpatient appropriate because: Medically stable, waiting for placement  Planned Discharge Destination: Skilled nursing facility   DVT prophylaxis-SCDs Time spent: 35 minutes  Author: Delfino LovettVipul Landis Cassaro, MD 01/20/2023 5:44 PM  For on call review [email or web address].

## 2023-01-20 NOTE — Assessment & Plan Note (Signed)
Last chemo was in January 2024.  Being followed up by oncology as outpatient, palliative care consult

## 2023-01-20 NOTE — Progress Notes (Addendum)
Physical Therapy Treatment Patient Details Name: Gary [name] MRN: [id number] DOB: 04-03-1946 Today's Date: 01/20/2023   History of Present Illness Gary [name] is a 77 y/o male with PMH of lymphoma, depression, DM2, HTN, COPD, HLD. Admitted with sepsis; weakness, N/V, diarrhea.    PT Comments    Pt in bed on entry, awake, agreeable to session. Pt reports has not gotten OOB today. ModA to EOB, but needs +2 for repositing in bed at end of session. Pt spends a considerable amount of time at EOB performing various exercises which is good as he has falls anxiety and this seems to build some confidence. We practice standing at end of session, he remains remarkably weak but not in motivation. Pt does continue to have some dizziness with standing >10 seconds. Will continue to follow.   Recommendations for follow up therapy are one component of a multi-disciplinary discharge planning process, led by the attending physician.  Recommendations may be updated based on patient status, additional functional criteria and insurance authorization.  Follow Up Recommendations  Can patient physically be transported by private vehicle: No    Assistance Recommended at Discharge Frequent or constant Supervision/Assistance  Patient can return home with the following Two people to help with walking and/or transfers;A lot of help with bathing/dressing/bathroom;Assistance with cooking/housework;Assist for transportation;Help with stairs or ramp for entrance   Equipment Recommendations       Recommendations for Other Services       Precautions / Restrictions Precautions Precautions: Fall Restrictions Weight Bearing Restrictions: No     Mobility  Bed Mobility Overal bed mobility: Needs Assistance Bed Mobility: Supine to Sit, Sit to Supine     Supine to sit: Mod assist, HOB elevated Sit to supine: +2 for physical assistance, Max assist        Transfers Overall transfer level: Needs  assistance Equipment used: Rolling walker (2 wheels) Transfers: Sit to/from Stand Sit to Stand: From elevated surface, Min assist           General transfer comment: very elevated, higher than armrest of recliner    Ambulation/Gait Ambulation/Gait assistance:  (deferred today)                 Stairs             Wheelchair Mobility    Modified Rankin (Stroke Patients Only)       Balance                                            Cognition Arousal/Alertness: Awake/alert Behavior During Therapy: WFL for tasks assessed/performed Overall Cognitive Status: Within Functional Limits for tasks assessed                                          Exercises Other Exercises Other Exercises: MaxA to EOB, HOB elevated: at EOB 1x10 bilat heel slide/LAQ; postural extension x10 + cues for cervical extension/retractions; alternate marching 2x10, alternate UE reach to feet and back up to tall sitting Other Exercises: STS from very elevated EOB 4x15sec (some dizziness and/or leg pain limiting time tolerance) Other Exercises: reclined set up for SAQ bilat 1x10, cervical retraction into pillows 1x5 (educated on this, several cervical extensor weakness)    General Comments  Pertinent Vitals/Pain Pain Assessment Pain Assessment: No/denies pain    Home Living                          Prior Function            PT Goals (current goals can now be found in the care plan section) Acute Rehab PT Goals Patient Stated Goal: get strength back PT Goal Formulation: With patient Time For Goal Achievement: 01/29/23 Potential to Achieve Goals: Fair Progress towards PT goals: Not progressing toward goals - comment    Frequency    Min 3X/week      PT Plan Current plan remains appropriate    Co-evaluation              AM-PAC PT "6 Clicks" Mobility   Outcome Measure  Help needed turning from your back to your side  while in a flat bed without using bedrails?: Total Help needed moving from lying on your back to sitting on the side of a flat bed without using bedrails?: Total Help needed moving to and from a bed to a chair (including a wheelchair)?: A Lot Help needed standing up from a chair using your arms (e.g., wheelchair or bedside chair)?: A Lot Help needed to walk in hospital room?: Total Help needed climbing 3-5 steps with a railing? : Total 6 Click Score: 8    End of Session   Activity Tolerance: Patient limited by fatigue;Patient tolerated treatment well Patient left: in bed;with call bell/phone within reach;with bed alarm set Nurse Communication: Mobility status;Precautions PT Visit Diagnosis: Muscle weakness (generalized) (M62.81);Difficulty in walking, not elsewhere classified (R26.2);Unsteadiness on feet (R26.81)     Time: 6010-9323 PT Time Calculation (min) (ACUTE ONLY): 37 min  Charges:  $Therapeutic Exercise: 23-37 mins                    4:31 PM, 01/20/23 Rosamaria Lints, PT, DPT Physical Therapist - [facility]  [phone] (ASCOM)    Andranik Jeune C 01/20/2023, 4:28 PM

## 2023-01-20 NOTE — Consult Note (Signed)
Consultation Note Date: 01/20/2023   Patient Name: Gary [name]  DOB: [birth year]  MRN: [id number]  Age / Sex: 77 y.o., male  PCP: Dale Westmont, MD Referring Physician: Delfino Lovett, MD  Reason for Consultation: Establishing goals of care   HPI/Brief Hospital Course: 77 y.o. male  with past medical history of recurrent stage III mantle cell lymphoma who has completed 3 cycles of Rituxan and Treanda with most recent PET scan 12/10/2022 showing significant interval disease improvement, IDT2DM, HTN, HLD and depression admitted from home on 01/12/2023 with increased weakness, hypotension and recurrent diarrhea.  Last oncology treatment completed in January, reports GI upset including diarrhea, N/V started around that time. Reports 5-6 episodes of diarrhea per day. Reports somewhat an improvement in frequency of episodes over the last month but continues to have 2-3 episodes of diarrhea per day. He also reports reduced oral consumption due to associated nausea and dysphagia.  Work-up revealing: Sepsis likely secondary to lower extremity cellulitis and possible PNA, started on IV antibiotic therapy  Underwent EGD (4/8) showing mild narrowing of GEJ, successful dilation, recommended continue PPI BID and follow-up as outpatient  Palliative medicine was consulted for assisting with goals of care conversations  Subjective:  Extensive chart review has been completed prior to meeting patient including labs, vital signs, imaging, progress notes, orders, and available advanced directive documents from current and previous encounters.  Introduced myself as a Publishing rights manager as a member of the palliative care team. Explained palliative medicine is specialized medical care for people living with serious illness. It focuses on providing relief from the symptoms and stress of a serious illness. The goal is to improve quality of life for both the patient and the family.    Visited with Gary [name] at his bedside. Awake and alert, denies acute pain or discomfort. Reports feeling well this morning. Continues to have diarrhea. Eager to be able to drink and eat today.  Gary [name] shares a brief life review. He is widowed, has one son that is estranged-has had no contact in several years. He lives at home alone with his dog. Has neighbors that help him as well as a sister-Susan who lives in [place]. Prior to admission, he shares he was fairly independent with ADL's, unable to drive and relied on public transportation to transport him to medical appointments.  Gary [name] shares he has suffered with ongoing diarrhea for several months as well as dysphagia. He shares that after taking medications and meals he regurgitates small-moderate amounts of bile. He reports a reduction in his oral intake and likely associated weight loss but unsure of amount.  Gary [name] shares his anxiety as he was told by a provider today their recommendation of following with hospice on discharge. Gary [name] shares he did not know his condition was as bad as it is.  Attempted to further illicit goals of care and Code Status. Presented topic of Code Status. Encouraged Gary [name] to consider DNR/DNI status understanding evidenced based poor outcomes in similar hospitalized patients, as the cause of the arrest is likely associated with chronic/terminal disease rather than a reversible acute cardio-pulmonary event.    At this time, he shares that in the past when presented with this question he has always wanted resuscitation but at this time fears his time is limited and wants to avoid prolonged suffering. He wishes for further time for processing and ongoing conversations. He also requests that I call and speak with his sister-Susan.  Attempted to call Susan-VM  left.  Gary [name] voices his understanding that at discharge he will likely need SNF. He agrees but is also concerned about someone caring for his  dog.  I discussed importance of continued conversations with family/support persons and all members of their medical team regarding overall plan of care and treatment options ensuring decisions are in alignment with patients goals of care.  All questions/concerns addressed. Emotional support provided to patient/family/support persons. PMT will continue to follow and support patient as needed.   Physical Exam Constitutional:      General: He is not in acute distress. Pulmonary:     Effort: Pulmonary effort is normal. No respiratory distress.  Skin:    General: Skin is warm and dry.  Neurological:     Mental Status: He is alert.     Motor: Weakness present.     Vital Signs: BP 124/64 (BP Location: Left Arm)   Pulse (!) 59   Temp 97.8 F (36.6 C)   Resp 18   Ht 5\' 10"  (1.778 m)   Wt 102.1 kg   SpO2 100%   BMI 32.30 kg/m  Pain Scale: 0-10 POSS *See Group Information*: 1-Acceptable,Awake and alert Pain Score: 0-No pain    Palliative Assessment/Data: 50%   Assessment and Plan  SUMMARY OF RECOMMENDATIONS   Full Code-continue current plan of care Ongoing GOC conversations needed-would also recommend completion of AD Attempt to connect with [name] PMT to continue to follow for ongoing needs and support  Thank you for this consult and allowing Palliative Medicine to participate in the care of Gary [name]. Palliative medicine will continue to follow and assist as needed.   Time Total: 75 minutes  Time spent includes: Detailed review of medical records (labs, imaging, vital signs), medically appropriate exam (mental status, respiratory, cardiac, skin), discussed with treatment team, counseling and educating patient, family and staff, documenting clinical information, medication management and coordination of care.   Signed by: Leeanne Deed, DNP, AGNP-C Palliative Medicine    Please contact Palliative Medicine Team phone at [phone] for questions and concerns.  For  individual provider: See Loretha Stapler

## 2023-01-20 NOTE — Inpatient Diabetes Management (Signed)
Inpatient Diabetes Program Recommendations  AACE/ADA: New Consensus Statement on Inpatient Glycemic Control   Target Ranges:  Prepandial:   less than 140 mg/dL      Peak postprandial:   less than 180 mg/dL (1-2 hours)      Critically ill patients:  140 - 180 mg/dL    Latest Reference Range & Units 01/20/23 01:19 01/20/23 01:46 01/20/23 01:48 01/20/23 03:21 01/20/23 08:14  Glucose-Capillary 70 - 99 mg/dL 68 (L) 67 (L) 92 022 (H) 95    Latest Reference Range & Units 01/19/23 08:31 01/19/23 11:24 01/19/23 16:10 01/19/23 19:29  Glucose-Capillary 70 - 99 mg/dL 66 (L) 336 (H) 122 (H) 134 (H)   Review of Glycemic Control  Diabetes history: DM1 Outpatient Diabetes medications: Medtronic Insulin Pump (total basal 17.925 units per day, Carb Ratio 1:9 grams, Sensitivity Factor 1:60 mg/dl) Current orders for Inpatient glycemic control: Semglee 10 units QHS, Novolog 0-15 units TID with meals, Novolog 0-5 units QHS, Novolog 3 units TID with meals  Inpatient Diabetes Program Recommendations:    Insulin: Please consider decreasing Semglee to 8 units QHS and Novolog correction to 0-9 units TID with meals.  Thanks, Orlando Penner, RN, MSN, CDCES Diabetes Coordinator Inpatient Diabetes Program [phone] (Team Pager from 8am to 5pm)

## 2023-01-20 NOTE — Assessment & Plan Note (Signed)
Continue statin. 

## 2023-01-20 NOTE — TOC Progression Note (Signed)
Transition of Care [facility]) - Progression Note    Patient Details  Name: Gary [name] MRN: [id number] Date of Birth: 04-18-1946  [facility] CM/SW Contact  Truddie Hidden, RN Phone Number: 01/20/2023, 1:53 PM  Clinical Narrative:    Sherron Monday with Tiffany from [facility] regarding bed offer. Facility wanted to confirm patient can tolerate PO med. Per nurse patient is tolerating PO meds without difficulty.  Facility updated. Offer being considered.    Expected Discharge Plan:  (Pending Medical Workup) Barriers to Discharge: No Barriers Identified  Expected Discharge Plan and Services   Discharge Planning Services: CM Consult   Living arrangements for the past 2 months: Single Family Home                   DME Agency:  (Patient has a rolling walker and a single point cane at home.)                   Social Determinants of Health (SDOH) Interventions SDOH Screenings   Food Insecurity: No Food Insecurity (01/12/2023)  Housing: Low Risk  (01/12/2023)  Transportation Needs: No Transportation Needs (01/12/2023)  Recent Concern: Transportation Needs - Unmet Transportation Needs (01/11/2023)  Utilities: Not At Risk (01/12/2023)  Alcohol Screen: Low Risk  (11/27/2020)  Depression (PHQ2-9): High Risk (01/12/2023)  Financial Resource Strain: Low Risk  (03/10/2022)  Physical Activity: Inactive (03/10/2022)  Social Connections: Socially Isolated (03/10/2022)  Stress: No Stress Concern Present (03/10/2022)  Tobacco Use: High Risk (01/19/2023)    Readmission Risk Interventions    01/15/2023   11:37 AM 08/30/2021   10:48 AM  Readmission Risk Prevention Plan  Transportation Screening Complete Complete  PCP or Specialist Appt within 3-5 Days Complete Complete  HRI or Home Care Consult Complete Complete  Social Work Consult for Recovery Care Planning/Counseling Complete Complete  Palliative Care Screening Not Applicable Not Applicable  Medication Review Oceanographer) Complete  Complete

## 2023-01-20 NOTE — Plan of Care (Signed)
  Problem: Fluid Volume: Goal: Hemodynamic stability will improve Outcome: Progressing   Problem: Clinical Measurements: Goal: Diagnostic test results will improve Outcome: Progressing Goal: Signs and symptoms of infection will decrease Outcome: Progressing   Problem: Respiratory: Goal: Ability to maintain adequate ventilation will improve Outcome: Progressing   Problem: Education: Goal: Knowledge of General Education information will improve Description: Including pain rating scale, medication(s)/side effects and non-pharmacologic comfort measures Outcome: Progressing   Problem: Health Behavior/Discharge Planning: Goal: Ability to manage health-related needs will improve Outcome: Progressing   Problem: Clinical Measurements: Goal: Ability to maintain clinical measurements within normal limits will improve Outcome: Progressing Goal: Will remain free from infection Outcome: Progressing Goal: Diagnostic test results will improve Outcome: Progressing Goal: Respiratory complications will improve Outcome: Progressing Goal: Cardiovascular complication will be avoided Outcome: Progressing   Problem: Activity: Goal: Risk for activity intolerance will decrease Outcome: Progressing   Problem: Nutrition: Goal: Adequate nutrition will be maintained Outcome: Progressing   Problem: Coping: Goal: Level of anxiety will decrease Outcome: Progressing   Problem: Elimination: Goal: Will not experience complications related to bowel motility Outcome: Progressing Goal: Will not experience complications related to urinary retention Outcome: Progressing   Problem: Pain Managment: Goal: General experience of comfort will improve Outcome: Progressing   Problem: Safety: Goal: Ability to remain free from injury will improve Outcome: Progressing   Problem: Skin Integrity: Goal: Risk for impaired skin integrity will decrease Outcome: Progressing   Problem: Education: Goal: Ability  to describe self-care measures that may prevent or decrease complications (Diabetes Survival Skills Education) will improve Outcome: Progressing Goal: Individualized Educational Video(s) Outcome: Progressing   Problem: Coping: Goal: Ability to adjust to condition or change in health will improve Outcome: Progressing   Problem: Fluid Volume: Goal: Ability to maintain a balanced intake and output will improve Outcome: Progressing   Problem: Health Behavior/Discharge Planning: Goal: Ability to identify and utilize available resources and services will improve Outcome: Progressing Goal: Ability to manage health-related needs will improve Outcome: Progressing   Problem: Metabolic: Goal: Ability to maintain appropriate glucose levels will improve Outcome: Progressing   Problem: Nutritional: Goal: Maintenance of adequate nutrition will improve Outcome: Progressing Goal: Progress toward achieving an optimal weight will improve Outcome: Progressing   Problem: Skin Integrity: Goal: Risk for impaired skin integrity will decrease Outcome: Progressing   Problem: Tissue Perfusion: Goal: Adequacy of tissue perfusion will improve Outcome: Progressing   Problem: Education: Goal: Ability to describe self-care measures that may prevent or decrease complications (Diabetes Survival Skills Education) will improve Outcome: Progressing Goal: Individualized Educational Video(s) Outcome: Progressing   Problem: Cardiac: Goal: Ability to maintain an adequate cardiac output will improve Outcome: Progressing   Problem: Health Behavior/Discharge Planning: Goal: Ability to identify and utilize available resources and services will improve Outcome: Progressing Goal: Ability to manage health-related needs will improve Outcome: Progressing   Problem: Fluid Volume: Goal: Ability to achieve a balanced intake and output will improve Outcome: Progressing   Problem: Metabolic: Goal: Ability to  maintain appropriate glucose levels will improve Outcome: Progressing   Problem: Nutritional: Goal: Maintenance of adequate nutrition will improve Outcome: Progressing Goal: Maintenance of adequate weight for body size and type will improve Outcome: Progressing   Problem: Respiratory: Goal: Will regain and/or maintain adequate ventilation Outcome: Progressing   Problem: Urinary Elimination: Goal: Ability to achieve and maintain adequate renal perfusion and functioning will improve Outcome: Progressing

## 2023-01-21 DIAGNOSIS — A419 Sepsis, unspecified organism: Secondary | ICD-10-CM | POA: Diagnosis not present

## 2023-01-21 DIAGNOSIS — C8318 Mantle cell lymphoma, lymph nodes of multiple sites: Secondary | ICD-10-CM | POA: Diagnosis not present

## 2023-01-21 DIAGNOSIS — Z7189 Other specified counseling: Secondary | ICD-10-CM | POA: Diagnosis not present

## 2023-01-21 DIAGNOSIS — E8729 Other acidosis: Secondary | ICD-10-CM | POA: Diagnosis not present

## 2023-01-21 DIAGNOSIS — L0390 Cellulitis, unspecified: Secondary | ICD-10-CM | POA: Diagnosis not present

## 2023-01-21 DIAGNOSIS — I10 Essential (primary) hypertension: Secondary | ICD-10-CM | POA: Diagnosis not present

## 2023-01-21 LAB — GLUCOSE, CAPILLARY
Glucose-Capillary: 105 mg/dL — ABNORMAL HIGH (ref 70–99)
Glucose-Capillary: 147 mg/dL — ABNORMAL HIGH (ref 70–99)
Glucose-Capillary: 168 mg/dL — ABNORMAL HIGH (ref 70–99)
Glucose-Capillary: 170 mg/dL — ABNORMAL HIGH (ref 70–99)
Glucose-Capillary: 176 mg/dL — ABNORMAL HIGH (ref 70–99)
Glucose-Capillary: 82 mg/dL (ref 70–99)
Glucose-Capillary: 82 mg/dL (ref 70–99)

## 2023-01-21 LAB — BASIC METABOLIC PANEL
Anion gap: 6 (ref 5–15)
BUN: 11 mg/dL (ref 8–23)
CO2: 28 mmol/L (ref 22–32)
Calcium: 7.4 mg/dL — ABNORMAL LOW (ref 8.9–10.3)
Chloride: 106 mmol/L (ref 98–111)
Creatinine, Ser: 0.94 mg/dL (ref 0.61–1.24)
GFR, Estimated: >60 mL/min (ref >60)
Glucose, Bld: 101 mg/dL — ABNORMAL HIGH (ref 70–99)
Potassium: 4.1 mmol/L (ref 3.5–5.1)
Sodium: 140 mmol/L (ref 135–145)

## 2023-01-21 LAB — CBC
HCT: 32.4 % — ABNORMAL LOW (ref 39.0–52.0)
Hemoglobin: 10.2 g/dL — ABNORMAL LOW (ref 13.0–17.0)
MCH: 29.7 pg (ref 26.0–34.0)
MCHC: 31.5 g/dL (ref 30.0–36.0)
MCV: 94.2 fL (ref 80.0–100.0)
Platelets: 59 10*3/uL — ABNORMAL LOW (ref 150–400)
RBC: 3.44 MIL/uL — ABNORMAL LOW (ref 4.22–5.81)
RDW: 14.4 % (ref 11.5–15.5)
WBC: 3.1 10*3/uL — ABNORMAL LOW (ref 4.0–10.5)
nRBC: 0 % (ref 0.0–0.2)

## 2023-01-21 MED ORDER — INSULIN GLARGINE-YFGN 100 UNIT/ML ~~LOC~~ SOLN
5.0000 [IU] | Freq: Every day | SUBCUTANEOUS | Status: DC
  Administered 2023-01-21 – 2023-01-24 (×4): 5 [IU] via SUBCUTANEOUS
  Filled 2023-01-21 (×5): qty 0.05

## 2023-01-21 MED ORDER — FLORANEX PO PACK
1.0000 g | PACK | Freq: Three times a day (TID) | ORAL | Status: DC
  Administered 2023-01-21 – 2023-01-25 (×13): 1 g via ORAL
  Filled 2023-01-21 (×13): qty 1

## 2023-01-21 NOTE — Care Management Important Message (Signed)
Important Message  Patient Details  Name: Gary [name] MRN: [id number] Date of Birth: 03-Jul-1946   Medicare Important Message Given:  Yes     Olegario Messier A Ziair Penson 01/21/2023, 2:09 PM

## 2023-01-21 NOTE — Assessment & Plan Note (Addendum)
Last chemo was in January 2024.  Being followed up by oncology as outpatient, patient reports chronic diarrhea since start of chemotherapy.  I have recommended to continue Imodium as that seem to help and consider using lactobacillus He also has pancytopenia which is likely due to his lymphoma/chemo

## 2023-01-21 NOTE — Assessment & Plan Note (Signed)
Patient is having a lot of GERD issues and spitting out most of the medications. -GI was consulted for abnormal esophageal gram.  EGD with benign esophageal stenosis and narrowing of GEJ junction which was dilated -Continue PPI twice daily -Follow-up with primary GI as outpatient-patient reports ongoing dysphagia.  Poor p.o. intake and chronic diarrhea with associated weight loss.  A lot of this could be related to his underlying lymphoma and or chemo

## 2023-01-21 NOTE — Assessment & Plan Note (Signed)
Patient has elevated lactic acid of 2.1, leukopenia, elevated heart rate, low normal tensive, with possible source of infection, cellulitis of the right leg vs CAP Blood cultures remain negative, procalcitonin elevated.  Lactic acidosis resolved. Improving lower extremity cellulitis. Completed the course of ceftriaxone and Zithromax Treated also with Zyvox, discontinued on 4/10

## 2023-01-21 NOTE — Progress Notes (Signed)
Daily Progress Note   Patient Name: Gary [name]       Date: 01/21/2023 DOB: 1946-03-18  Age: 77 y.o. MRN#: [id number] Attending Physician: Delfino Lovett, MD Primary Care Physician: Dale Rogers, MD Admit Date: 01/12/2023  Reason for Consultation/Follow-up: Establishing goals of care  Subjective: Notes and labs reviewed. In to see patient. PT/OT at bedside. He discusses previous interactions with other members of the team. He discusses being upset that hospice has been discussed with him. He advises he just wants his diarrhea stopped.   Discussed that as PT/OT is working with him, I would follow up for further conversation tomorrow.   Length of Stay: 9  Current Medications: Scheduled Meds:   Chlorhexidine Gluconate Cloth  6 each Topical Daily   ferrous sulfate  325 mg Oral Q lunch   insulin aspart  0-15 Units Subcutaneous TID WC   insulin aspart  0-5 Units Subcutaneous QHS   insulin glargine-yfgn  5 Units Subcutaneous QHS   lactobacillus  1 g Oral TID WC   levothyroxine  50 mcg Oral QAC breakfast   midodrine  10 mg Oral TID WC   multivitamin with minerals  1 tablet Oral Daily   pantoprazole  40 mg Oral BID AC   pravastatin  40 mg Oral QHS    Continuous Infusions:   PRN Meds: dextrose, loperamide, ondansetron **OR** ondansetron (ZOFRAN) IV, mouth rinse  Physical Exam Pulmonary:     Effort: Pulmonary effort is normal.  Neurological:     Mental Status: He is alert.             Vital Signs: BP 118/60 (BP Location: Right Arm)   Pulse 66   Temp 98 F (36.7 C)   Resp 18   Ht 5\' 10"  (1.778 m)   Wt 102.1 kg   SpO2 100%   BMI 32.30 kg/m  SpO2: SpO2: 100 % O2 Device: O2 Device: Room Air O2 Flow Rate: O2 Flow Rate (L/min): 2 L/min  Intake/output summary:  Intake/Output  Summary (Last 24 hours) at 01/21/2023 1543 Last data filed at 01/21/2023 0601 Gross per 24 hour  Intake 220 ml  Output 200 ml  Net 20 ml   LBM: Last BM Date : 01/21/23 Baseline Weight: Weight: 93 kg Most recent weight: Weight: 102.1 kg    Patient Active Problem  List   Diagnosis Date Noted   Increased anion gap metabolic acidosis 01/14/2023   Elevated INR 01/13/2023   Pressure injury of skin 01/13/2023   Orthostatic hypotension 01/12/2023   Sepsis due to cellulitis 01/12/2023   AKI (acute kidney injury) 01/12/2023   Depression, major, single episode, mild 11/15/2022   Recurrent falls 08/29/2021   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    Left rib fracture_7th rib    Leukocytosis    HTN (hypertension)    Hypothyroid    CAD (coronary artery disease)    Porokeratosis 05/15/2021   Aortic atherosclerosis 05/05/2021   Emphysema lung 05/05/2021   Thrombocytopenia 04/29/2021   Splinter of foot without infection 02/23/2021   Arm laceration 02/09/2021   Hypokalemia 10/24/2020   Injury by nail 08/15/2020   Shingles 07/24/2020   Rash 07/02/2020   Colon cancer screening 07/02/2020   Sick sinus syndrome 12/28/2019   Acquired absence of kidney 09/13/2019   Edema of lower extremity 09/13/2019   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, or unspecified 09/13/2019   Proteinuria 09/13/2019   Lower extremity pain, bilateral    Cellulitis 08/18/2019   Left hip pain 07/16/2019   Pain due to onychomycosis of toenails of both feet 04/03/2019   Swelling of limb 02/07/2019   CKD (chronic kidney disease) stage 3, GFR 30-59 ml/min 09/16/2018   Left shoulder pain 09/16/2018   Chronic cholecystitis 12/29/2017   Graves disease 12/29/2017   Peripheral neuropathy 12/29/2017   S/p nephrectomy 12/29/2017   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   Symptomatic bradycardia 12/02/2017   CKD (chronic kidney disease), stage IIIa 12/02/2017    Chronic systolic CHF (congestive heart failure) 12/02/2017   Saturday night paralysis 11/12/2017   Hypoglycemia 10/28/2017   Goals of care, counseling/discussion 10/24/2017   Lymphedema 10/20/2017   Iron deficiency anemia 09/11/2017   Fall 08/13/2017   Humerus fracture 08/13/2017   Anemia 01/12/2017   Bilateral carotid artery stenosis 06/29/2016   Hand laceration 12/16/2015   Adjustment disorder with depressed mood 08/11/2015   Cardiomyopathy, idiopathic 04/22/2015   CAD in native artery 11/02/2014   Mantle cell lymphoma 08/03/2014   History of colonic polyps 04/29/2014   Irregular heart beat 04/22/2014   SOB (shortness of breath) 04/22/2014   Stress 03/21/2014   PVC (premature ventricular contraction) 02/21/2014   GERD (gastroesophageal reflux disease) 10/23/2013   Gastroparesis 06/25/2013   Chest pain 04/13/2013   Thyroid disease 04/13/2013   Diabetes 04/13/2013   Essential hypertension, benign 04/13/2013   Hypercholesterolemia 04/13/2013   Constipation, unspecified 10/13/1999   Depression, unspecified 10/13/1999   Dry eye syndrome of bilateral lacrimal glands 10/13/1999   Chronic venous insufficiency 10/13/1999   Athscl heart disease of native coronary artery w/o ang pctrs 10/13/1999    Palliative Care Assessment & Plan   Recommendations/Plan: PMT will follow.    Code Status:    Code Status Orders  (From admission, onward)           Start     Ordered   01/12/23 1730  Full code  Continuous       Question:  By:  Answer:  Other   01/12/23 1732           Code Status History     Date Active Date Inactive Code Status Order ID Comments User Context   08/29/2021 1032 09/13/2021 2203 Full Code [id number]  Lorretta HarpNiu, Xilin, MD ED   08/18/2019 1925 08/21/2019 2201 Full Code [id number]  Anselm Jungling, DO ED   12/03/2017 0235 12/11/2017 1855 Full Code [id number]  Oralia Manis, MD Inpatient   10/28/2017 1720 10/29/2017 1816 Full Code [id number]  Auburn Bilberry, MD Inpatient    01/12/2017 0238 01/13/2017 2044 Full Code [id number]  Hugelmeyer, Jon Gills, DO Inpatient       Prognosis:  Unable to determine    Thank you for allowing the Palliative Medicine Team to assist in the care of this patient.    Morton Stall, NP  Please contact Palliative Medicine Team phone at [phone] for questions and concerns.

## 2023-01-21 NOTE — Assessment & Plan Note (Signed)
With CKD 3B -at baseline now  Lab Results  Component Value Date   CREATININE 0.94 01/21/2023   CREATININE 0.96 01/20/2023   CREATININE 1.03 01/18/2023

## 2023-01-21 NOTE — Assessment & Plan Note (Signed)
INR elevated at 3.5 on admission which has been improved now.  Patient was not on Coumadin.  Was given heparin subcu for DVT prophylaxis.  Hepatic function with mildly low total protein and albumin and rest normal.  Also found to have significantly elevated APTT which started improving today.  DIC labs and peripheral smear negative. Did receive 1 dose of IV vitamin K

## 2023-01-21 NOTE — Assessment & Plan Note (Signed)
CBG running low.  Will cut back on insulin Semglee from 8 -> 5 units and stop meal coverage as he is not eating much -Continue with SSI

## 2023-01-21 NOTE — Assessment & Plan Note (Signed)
TSH elevated at 7.7 -Increased the dose of Synthroid to 50 this admission

## 2023-01-21 NOTE — Progress Notes (Signed)
Progress Note   Patient: Gary [name] DOA: 01/12/2023     9 DOS: the patient was seen and examined on 01/21/2023   Brief hospital course: Gary [name] is a 77 year old male with history of recurrent stage III mantle cell lymphoma who has completed 43 cycles of Rituxan and Treanda, depression, insulin-dependent diabetes mellitus, hypertension, hyperlipidemia, who presents to the emergency department for chief concerns of generalized weakness and hypotension.  Patient is reported to have nausea and vomiting and diarrhea and this has been ongoing for the last several months  Vitals in the ED showed temperature of 98, respiration rate of 16, heart rate of 108, blood pressure 88/60, SpO2 of 99% on room air.  Serum sodium is 134, potassium 3.5, chloride 101, bicarb 19, BUN of 37, serum creatinine of 2.46, EGFR of 27, nonfasting blood glucose 70, WBC 3.3, hemoglobin 11.3, platelets of 92.  High sensitive troponin is 27.  Lactic acid is 2.1.  Blood cultures x 2 are in process.  ED treatment: Ceftriaxone 2 g IV one-time dose, lactated ringer 1 L bolus, vancomycin.  4/3:Labs with slight worsening of metabolic acidosis, creatinine with mild improvement to 2.43, worsening leukopenia and thrombocytopenia but all cell lines decreased-some dilutional effect.  On imaging which include CT head, cervical spine, CT abdominal and pelvis were negative for any acute abnormality.  INR at 3.5-discontinue DVT prophylaxis with heparin-SCDs ordered. Patient again becoming hypotensive-give him 1 more liter of bolus and also starting midodrine. Still no obvious source of infection as blood cultures remain negative in 24 hours. Will continue broad-spectrum antibiotics.  Concern of lower extremity cellulitis.  4/4: Blood pressure remained on softer side, midodrine dose was increased to 10 mg, 3 times daily.  GI pathogen panel negative, C. difficile negative, Blood cultures remain negative.   Labs with very slowly improving creatinine to 2.33, worsening now anion gap metabolic acidosis with bicarb at 14, mildly elevated T. bili at 1.4.  Lactic acid was normal but found to have elevated beta hydroxybutyric acid, concern of developing DKA. Rechecking BMP if Remained open we will start him on Endo tool. INR still elevated, APTT started improving.  DIC labs negative.  Vitamin K levels pending  Addendum.  Repeat BMP remained high anion gap metabolic acidosis with elevated blood glucose level.  Patient seems like in DKA.  Transferring to stepdown and starting on Endo tool.  4/5: Blood pressure remained little soft.  CBG improved and gap closed.  [name]  He was later managed with resistant sliding scale every 4 hourly.  Potassium was also repleted overnight.  A.m. labs with some improvement of creatinine to 1.99, bicarb still low at 20-infusion was discontinued overnight and he is now being restarted on p.o. bicarb supplement.  TSH found to be elevated at 7.715-Home Synthroid dose was increased to 50. Esophagogram was ordered by GI today.  4/6: Blood pressure remained on softer side.  CBG improved, converted to SSI with meals along with 10 units of Smeglee.  Esophageal gram with limited study due to patient's inability to position properly but did show patulous esophagus with dysmotility and spontaneous reflex.  Potentially GEJ narrowing.  Will need EGD after improvement in current acute illness.  GI pathogen panel negative.  4/7: Patient slowly improving.  GI planning EGD tomorrow with Dr. Timothy Lassousso.  Awaiting SNF placement.  4/8: Hemodynamically stable.  EGD today shows mild narrowing of GEJ,  which was dilated with good results.  GI is recommending twice daily PPI and follow-up with primary GI as outpatient. Completed a course of antibiotics for concern of pneumonia.  Will continue on p.o. linezolid for total of 7  days.  4/9:Vitals stable.  Had 1 episode of hypoglycemia with CBG at 66 this morning.  Patient was n.p.o. most of the day yesterday.  Improved p.o. intake and regurgitation today. 4/10 : Nursing reported loose watery diarrhea last evening but upon further discussion with patient he has been having ongoing diarrhea for several months as well as dysphagia and associated weight loss due to poor p.o. intake.  Holding off C. difficile testing for now per discussion with ID. awaiting SNF placement.  Palliative care consult.  Stopping Zyvox 4/11: Reporting chronic diarrhea since start of chemotherapy.  Cutting back on insulin Semglee as blood sugar still running on the lower side  Assessment and Plan: * Sepsis due to cellulitis Patient has elevated lactic acid of 2.1, leukopenia, elevated heart rate, low normal tensive, with possible source of infection, cellulitis of the right leg vs CAP Blood cultures remain negative, procalcitonin elevated.  Lactic acidosis resolved. Improving lower extremity cellulitis. Completed the course of ceftriaxone and Zithromax Treated also with Zyvox, discontinued on 4/10  Elevated INR INR elevated at 3.5 on admission which has been improved now.  Patient was not on Coumadin.  Was given heparin subcu for DVT prophylaxis.  Hepatic function with mildly low total protein and albumin and rest normal.  Also found to have significantly elevated APTT which started improving today.  DIC labs and peripheral smear negative. Did receive 1 dose of IV vitamin K   Increased anion gap metabolic acidosis Present initially but now resolved with treatment  AKI (acute kidney injury) With CKD 3B -at baseline now  Lab Results  Component Value Date   CREATININE 0.94 01/21/2023   CREATININE 0.96 01/20/2023   CREATININE 1.03 01/18/2023     Essential hypertension, benign Blood pressure currently soft requiring midodrine  Hypothyroid TSH elevated at 7.7 -Increased the dose of  Synthroid to 50 this admission  Adjustment disorder with depressed mood Fluoxetine 20 mg daily resumed  Hypercholesterolemia Continue statin  Mantle cell lymphoma Last chemo was in January 2024.  Being followed up by oncology as outpatient, patient reports chronic diarrhea since start of chemotherapy.  I have recommended to continue Imodium as that seem to help and consider using lactobacillus He also has pancytopenia which is likely due to his lymphoma/chemo  GERD (gastroesophageal reflux disease) Patient is having a lot of GERD issues and spitting out most of the medications. -GI was consulted for abnormal esophageal gram.  EGD with benign esophageal stenosis and narrowing of GEJ junction which was dilated -Continue PPI twice daily -Follow-up with primary GI as outpatient-patient reports ongoing dysphagia.  Poor p.o. intake and chronic diarrhea with associated weight loss.  A lot of this could be related to his underlying lymphoma and or chemo   Diabetes CBG running low.  Will cut back on insulin Semglee from 8 -> 5 units and stop meal coverage as he is not eating much -Continue with SSI        Subjective: Patient reports having chronic diarrhea since start of his chemotherapy for lymphoma and needs to get cleaned up as he just had another bowel movement.  Feeling weak  Physical Exam: Vitals:   01/21/23 0000 01/21/23 0448 01/21/23 0847 01/21/23 1205  BP: (!) 114/53 (!) 108/49 (!) 103/46 118/60  Pulse: 72 75 71 66  Resp: 18 16 18 18   Temp: 97.9 F (36.6 C) 98 F (36.7 C) 98 F (36.7 C)   TempSrc: Oral Oral    SpO2: 100% 97% 98% 100%  Weight:      Height:       General.  Frail elderly man, in no acute distress. Pulmonary.  Lungs clear bilaterally, normal respiratory effort. CV.  Regular rate and rhythm, no JVD, rub or murmur. Abdomen.  Soft, nontender, nondistended, BS positive. CNS.  Alert and oriented .  No focal neurologic deficit. Extremities.  Bilateral LE Ace  wrap, no erythema Psychiatry.  Judgment and insight appears normal.  Data Reviewed:  Platelets 59  Family Communication: None at bedside  Disposition: Status is: Inpatient Remains inpatient appropriate because: Waiting for placement  Planned Discharge Destination: Skilled nursing facility/[facility] tomorrow   DVT prophylaxis-SCD Time spent: 35 minutes  Author: Delfino Lovett, MD 01/21/2023 1:44 PM  For on call review [email or web address].

## 2023-01-21 NOTE — Progress Notes (Signed)
Occupational Therapy Treatment Patient Details Name: Gary [name] MRN: [id number] DOB: Dec 11, 1945 Today's Date: 01/21/2023   History of present illness Gary [name] is a 77 y/o male with PMH of lymphoma, depression, DM2, HTN, COPD, HLD. Admitted with sepsis; weakness, N/V, diarrhea.   OT comments  Upon entering the room, pt supine in bed and reports being upset over staff interaction with physician and asking questions about therapy recommendation. OT providing education and redirecting pt to therapeutic intervention. Pt then reports having had multiple bouts of diarrhea today and declining EOB and OOB activities. Pt is agreeable to strengthening exercises. Max A for bed mobility to reposition. Use of towel pulled taut for chest presses, forward rowing, backwards rowing, and bicep curls with sets of 10 and rest breaks between each set. Pt then also demonstrates 20 reps of ankle pumps and 10 reps of glute squeezes with min cuing for technique. Pt reports feeling in better mood at end of session. Call bell and all needed items within reach. Continue per OT POC.    Recommendations for follow up therapy are one component of a multi-disciplinary discharge planning process, led by the attending physician.  Recommendations may be updated based on patient status, additional functional criteria and insurance authorization.    Assistance Recommended at Discharge Frequent or constant Supervision/Assistance  Patient can return home with the following  Two people to help with walking and/or transfers;A lot of help with bathing/dressing/bathroom;Assistance with cooking/housework;Direct supervision/assist for medications management;Direct supervision/assist for financial management;Assist for transportation;Help with stairs or ramp for entrance   Equipment Recommendations  Other (comment) (defer)           Mobility Bed Mobility Overal bed mobility: Needs Assistance Bed Mobility: Rolling Rolling: Max assist               Transfers                   General transfer comment: Pt declined         ADL either performed or assessed with clinical judgement    Extremity/Trunk Assessment Upper Extremity Assessment Upper Extremity Assessment: Generalized weakness   Lower Extremity Assessment Lower Extremity Assessment: Generalized weakness        Vision Patient Visual Report: No change from baseline            Cognition Arousal/Alertness: Awake/alert Behavior During Therapy: WFL for tasks assessed/performed Overall Cognitive Status: Within Functional Limits for tasks assessed                                                     Pertinent Vitals/ Pain       Pain Assessment Pain Assessment: No/denies pain         Frequency  Min 1X/week        Progress Toward Goals  OT Goals(current goals can now be found in the care plan section)  Progress towards OT goals: Progressing toward goals     Plan Discharge plan remains appropriate;Frequency remains appropriate       AM-PAC OT "6 Clicks" Daily Activity     Outcome Measure   Help from another person eating meals?: None Help from another person taking care of personal grooming?: A Little Help from another person toileting, which includes using toliet, bedpan, or urinal?: Total Help from another person bathing (including washing, rinsing, drying)?:  A Lot Help from another person to put on and taking off regular upper body clothing?: A Little Help from another person to put on and taking off regular lower body clothing?: Total 6 Click Score: 14    End of Session    OT Visit Diagnosis: Unsteadiness on feet (R26.81);Repeated falls (R29.6)   Activity Tolerance Patient limited by fatigue   Patient Left in bed;with call bell/phone within reach;with bed alarm set   Nurse Communication Mobility status        Time: 3734-2876 OT Time Calculation (min): 28 min  Charges: OT General  Charges $OT Visit: 1 Visit OT Treatments $Therapeutic Exercise: 23-37 mins  Jackquline Denmark, MS, OTR/L , CBIS ascom [phone]  01/21/23, 4:17 PM

## 2023-01-21 NOTE — Assessment & Plan Note (Signed)
Present initially but now resolved with treatment

## 2023-01-21 NOTE — Assessment & Plan Note (Signed)
Blood pressure currently soft requiring midodrine 

## 2023-01-21 NOTE — Assessment & Plan Note (Signed)
Continue statin. 

## 2023-01-22 ENCOUNTER — Inpatient Hospital Stay: Payer: Medicare Other

## 2023-01-22 DIAGNOSIS — R791 Abnormal coagulation profile: Secondary | ICD-10-CM | POA: Diagnosis not present

## 2023-01-22 DIAGNOSIS — R197 Diarrhea, unspecified: Secondary | ICD-10-CM

## 2023-01-22 DIAGNOSIS — Z7189 Other specified counseling: Secondary | ICD-10-CM | POA: Diagnosis not present

## 2023-01-22 DIAGNOSIS — A419 Sepsis, unspecified organism: Secondary | ICD-10-CM | POA: Diagnosis not present

## 2023-01-22 DIAGNOSIS — C8318 Mantle cell lymphoma, lymph nodes of multiple sites: Secondary | ICD-10-CM | POA: Diagnosis not present

## 2023-01-22 DIAGNOSIS — L0390 Cellulitis, unspecified: Secondary | ICD-10-CM | POA: Diagnosis not present

## 2023-01-22 LAB — GLUCOSE, CAPILLARY
Glucose-Capillary: 127 mg/dL — ABNORMAL HIGH (ref 70–99)
Glucose-Capillary: 147 mg/dL — ABNORMAL HIGH (ref 70–99)
Glucose-Capillary: 158 mg/dL — ABNORMAL HIGH (ref 70–99)
Glucose-Capillary: 159 mg/dL — ABNORMAL HIGH (ref 70–99)
Glucose-Capillary: 187 mg/dL — ABNORMAL HIGH (ref 70–99)
Glucose-Capillary: 216 mg/dL — ABNORMAL HIGH (ref 70–99)

## 2023-01-22 MED ORDER — IOHEXOL 9 MG/ML PO SOLN
500.0000 mL | Freq: Once | ORAL | Status: AC | PRN
  Administered 2023-01-22: 500 mL via ORAL

## 2023-01-22 MED ORDER — ZINC OXIDE 40 % EX OINT
TOPICAL_OINTMENT | CUTANEOUS | Status: DC | PRN
  Filled 2023-01-22: qty 113

## 2023-01-22 MED ORDER — FLUOXETINE HCL 20 MG PO CAPS
20.0000 mg | ORAL_CAPSULE | Freq: Every day | ORAL | Status: DC
  Administered 2023-01-22 – 2023-01-25 (×4): 20 mg via ORAL
  Filled 2023-01-22 (×4): qty 1

## 2023-01-22 MED ORDER — CHOLESTYRAMINE 4 G PO PACK
4.0000 g | PACK | Freq: Four times a day (QID) | ORAL | Status: DC
  Administered 2023-01-22: 4 g via ORAL
  Filled 2023-01-22 (×3): qty 1

## 2023-01-22 MED ORDER — CHOLESTYRAMINE 4 G PO PACK
4.0000 g | PACK | Freq: Two times a day (BID) | ORAL | Status: DC
  Administered 2023-01-22 – 2023-01-25 (×6): 4 g via ORAL
  Filled 2023-01-22 (×6): qty 1

## 2023-01-22 MED ORDER — IOHEXOL 300 MG/ML  SOLN
100.0000 mL | Freq: Once | INTRAMUSCULAR | Status: AC | PRN
  Administered 2023-01-22: 100 mL via INTRAVENOUS

## 2023-01-22 MED ORDER — FUROSEMIDE 10 MG/ML IJ SOLN
80.0000 mg | INTRAMUSCULAR | Status: AC
  Administered 2023-01-22: 80 mg via INTRAVENOUS
  Filled 2023-01-22: qty 8

## 2023-01-22 NOTE — Assessment & Plan Note (Signed)
Blood pressure currently soft requiring midodrine 

## 2023-01-22 NOTE — Assessment & Plan Note (Signed)
Patient has elevated lactic acid of 2.1, leukopenia, elevated heart rate, low normal tensive, with possible source of infection, cellulitis of the right leg vs CAP Blood cultures remain negative, procalcitonin elevated.  Lactic acidosis resolved. Improving lower extremity cellulitis. Completed the course of ceftriaxone and Zithromax Treated also with Zyvox, discontinued on 4/10 

## 2023-01-22 NOTE — Assessment & Plan Note (Signed)
Patient is having a lot of GERD issues and spitting out most of the medications. -GI was consulted for abnormal esophageal gram.  EGD with benign esophageal stenosis and narrowing of GEJ junction which was dilated -Continue PPI twice daily -Follow-up with primary GI as outpatient-patient reports ongoing dysphagia.  Poor p.o. intake and chronic diarrhea with associated weight loss.  GI recommends outpatient follow-up 

## 2023-01-22 NOTE — Progress Notes (Signed)
Physical Therapy Treatment Patient Details Name: Gary [name] MRN: [id number] DOB: 1946/08/29 Today's Date: 01/22/2023   History of Present Illness Gary [name] is a 77 y/o male with PMH of lymphoma, depression, DM2, HTN, COPD, HLD. Admitted with sepsis; weakness, N/V, diarrhea.    PT Comments     Pt in bed on entry, awakens to voice. He is agreeable to PT and heavily motivated to participate given recent concerns regarding his prognosis for return to mobility. Pt assisted c ROM exercises to start to ascertain tolerance to limb mobility, strength, cognition- some error with sequencing, but nothing major. Pt requires modA to EOB from elevated HOB, but a bit more for return to supine. Pt partakes in 5 STS transfers over several minutes, goal of standing at minguard to supervision level for ~20-30sec each time. Author facilitates linen change during activity. Pt struggles with seated upright at EOB due to severe weakness of cervical extensors, slowly melting into end-range flexion without ability to pick head back up- this triggers pain in back of neck. Author subsequently provides forehead support as needed in session to avoid additional pain and/or compensation contortion of posture as an antalgic measure. Pt left in bed at EOS, HOB at 45 degrees, heels elevated- pt working further on some remaining lunch. Bed turned to facilitate view of window on such a pretty day. All needs met. RN updated on pt status.   Recommendations for follow up therapy are one component of a multi-disciplinary discharge planning process, led by the attending physician.  Recommendations may be updated based on patient status, additional functional criteria and insurance authorization.  Follow Up Recommendations  Can patient physically be transported by private vehicle: No    Assistance Recommended at Discharge Frequent or constant Supervision/Assistance  Patient can return home with the following Two people to help with  walking and/or transfers;A lot of help with bathing/dressing/bathroom;Assistance with cooking/housework;Assist for transportation;Help with stairs or ramp for entrance   Equipment Recommendations       Recommendations for Other Services       Precautions / Restrictions Precautions Precautions: Fall Restrictions Weight Bearing Restrictions: No     Mobility  Bed Mobility Overal bed mobility: Needs Assistance Bed Mobility: Supine to Sit, Sit to Supine     Supine to sit: Max assist Sit to supine: +2 for safety/equipment, Max assist, HOB elevated        Transfers Overall transfer level: Needs assistance Equipment used: Rolling walker (2 wheels) Transfers: Sit to/from Stand Sit to Stand: Mod assist, From elevated surface           General transfer comment: 5x from EOB x 20sec, 60-90sec seated recovery intervals; Pt assisted with head support as muscles fatigue to failure.    Ambulation/Gait Ambulation/Gait assistance:  (pt unable to inititate pregait upon attempt, too weak stil.)                 Stairs             Wheelchair Mobility    Modified Rankin (Stroke Patients Only)       Balance                                            Cognition  Exercises Other Exercises Other Exercises: cervical retraciton into 2 pillows 2x10 ( HOB at 26 degrees); Hooklying SAQ/heel slides combo 1x12 bilat Other Exercises: STS from elevated EOB, min-ModA 5x    General Comments        Pertinent Vitals/Pain Pain Assessment Pain Assessment: No/denies pain (intermittent pain with mobility during certain movements in session)    Home Living                          Prior Function            PT Goals (current goals can now be found in the care plan section) Acute Rehab PT Goals Patient Stated Goal: get strength back PT Goal Formulation: With patient Time For  Goal Achievement: 01/29/23 Potential to Achieve Goals: Fair Progress towards PT goals: Progressing toward goals    Frequency    Min 3X/week      PT Plan Current plan remains appropriate    Co-evaluation              AM-PAC PT "6 Clicks" Mobility   Outcome Measure  Help needed turning from your back to your side while in a flat bed without using bedrails?: Total Help needed moving from lying on your back to sitting on the side of a flat bed without using bedrails?: Total Help needed moving to and from a bed to a chair (including a wheelchair)?: A Lot Help needed standing up from a chair using your arms (e.g., wheelchair or bedside chair)?: A Lot Help needed to walk in hospital room?: Total Help needed climbing 3-5 steps with a railing? : Total 6 Click Score: 8    End of Session   Activity Tolerance: Patient limited by fatigue;Patient tolerated treatment well;Patient limited by pain Patient left: in bed;with call bell/phone within reach;with bed alarm set Nurse Communication: Mobility status;Precautions PT Visit Diagnosis: Muscle weakness (generalized) (M62.81);Difficulty in walking, not elsewhere classified (R26.2);Unsteadiness on feet (R26.81)     Time: 9983-3825 PT Time Calculation (min) (ACUTE ONLY): 37 min  Charges:  $Therapeutic Exercise: 23-37 mins                    2:30 PM, 01/22/23 Rosamaria Lints, PT, DPT Physical Therapist - [facility]  [phone] (ASCOM)    Bricia Taher C 01/22/2023, 2:23 PM

## 2023-01-22 NOTE — Assessment & Plan Note (Signed)
TSH elevated at 7.7 -Increased the dose of Synthroid to 50 this admission 

## 2023-01-22 NOTE — Assessment & Plan Note (Signed)
-   Fluoxetine 20 mg daily resumed 

## 2023-01-22 NOTE — Progress Notes (Signed)
Progress Note   Patient: Gary [name] QGB:[id number] DOB: May 14, 1946 DOA: 01/12/2023     10 DOS: the patient was seen and examined on 01/22/2023   Brief hospital course: [name] is a 77 year old male with history of recurrent stage III mantle cell lymphoma who has completed 43 cycles of Rituxan and Treanda, depression, insulin-dependent diabetes mellitus, hypertension, hyperlipidemia, who presents to the emergency department for chief concerns of generalized weakness and hypotension.  Patient is reported to have nausea and vomiting and diarrhea and this has been ongoing for the last several months  Vitals in the ED showed temperature of 98, respiration rate of 16, heart rate of 108, blood pressure 88/60, SpO2 of 99% on room air.  Serum sodium is 134, potassium 3.5, chloride 101, bicarb 19, BUN of 37, serum creatinine of 2.46, EGFR of 27, nonfasting blood glucose 70, WBC 3.3, hemoglobin 11.3, platelets of 92.  High sensitive troponin is 27.  Lactic acid is 2.1.  Blood cultures x 2 are in process.  ED treatment: Ceftriaxone 2 g IV one-time dose, lactated ringer 1 L bolus, vancomycin.  4/3:Labs with slight worsening of metabolic acidosis, creatinine with mild improvement to 2.43, worsening leukopenia and thrombocytopenia but all cell lines decreased-some dilutional effect.  On imaging which include CT head, cervical spine, CT abdominal and pelvis were negative for any acute abnormality.  INR at 3.5-discontinue DVT prophylaxis with heparin-SCDs ordered. Patient again becoming hypotensive-give him 1 more liter of bolus and also starting midodrine. Still no obvious source of infection as blood cultures remain negative in 24 hours. Will continue broad-spectrum antibiotics.  Concern of lower extremity cellulitis.  4/4: Blood pressure remained on softer side, midodrine dose was increased to 10 mg, 3 times daily.  GI pathogen panel negative, C. difficile negative, Blood cultures remain negative.   Labs with very slowly improving creatinine to 2.33, worsening now anion gap metabolic acidosis with bicarb at 14, mildly elevated T. bili at 1.4.  Lactic acid was normal but found to have elevated beta hydroxybutyric acid, concern of developing DKA. Rechecking BMP if Remained open we will start him on Endo tool. INR still elevated, APTT started improving.  DIC labs negative.  Vitamin K levels pending  Addendum.  Repeat BMP remained high anion gap metabolic acidosis with elevated blood glucose level.  Patient seems like in DKA.  Transferring to stepdown and starting on Endo tool.  4/5: Blood pressure remained little soft.  CBG improved and gap closed.  [name]  He was later managed with resistant sliding scale every 4 hourly.  Potassium was also repleted overnight.  A.m. labs with some improvement of creatinine to 1.99, bicarb still low at 20-infusion was discontinued overnight and he is now being restarted on p.o. bicarb supplement.  TSH found to be elevated at 7.715-Home Synthroid dose was increased to 50. Esophagogram was ordered by GI today.  4/6: Blood pressure remained on softer side.  CBG improved, converted to SSI with meals along with 10 units of Smeglee.  Esophageal gram with limited study due to patient's inability to position properly but did show patulous esophagus with dysmotility and spontaneous reflex.  Potentially GEJ narrowing.  Will need EGD after improvement in current acute illness.  GI pathogen panel negative.  4/7: Patient slowly improving.  GI planning EGD tomorrow with Dr. [name].  Awaiting SNF placement.  4/8: Hemodynamically stable.  EGD today shows mild narrowing of GEJ,  which was dilated with good results.  GI is recommending twice daily PPI and follow-up with primary GI as outpatient. Completed a course of antibiotics for concern of pneumonia.  Will continue on p.o. linezolid for total of 7  days.  4/9:Vitals stable.  Had 1 episode of hypoglycemia with CBG at 66 this morning.  Patient was n.p.o. most of the day yesterday.  Improved p.o. intake and regurgitation today. 4/10 : Nursing reported loose watery diarrhea last evening but upon further discussion with patient he has been having ongoing diarrhea for several months as well as dysphagia and associated weight loss due to poor p.o. intake.  Holding off C. difficile testing for now per discussion with ID. awaiting SNF placement.  Palliative care consult.  Stopping Zyvox 4/11: Reporting chronic diarrhea since start of chemotherapy.  Cutting back on insulin Semglee as blood sugar still running on the lower side 4/12: Reevaluated by GI for chronic diarrhea and also had a CT of abdomen and pelvis which does not show any acute pathology.  Added cholestyramine and probiotic/lactobacillus.  Ordered soft cervical collar per PT recommendation.  Give one-time dose of 80 mg of IV Lasix as he seems fluid overload. SNF on Monday  Assessment and Plan: * Sepsis due to cellulitis Patient has elevated lactic acid of 2.1, leukopenia, elevated heart rate, low normal tensive, with possible source of infection, cellulitis of the right leg vs CAP Blood cultures remain negative, procalcitonin elevated.  Lactic acidosis resolved. Improving lower extremity cellulitis. Completed the course of ceftriaxone and Zithromax Treated also with Zyvox, discontinued on 4/10  Elevated INR INR elevated at 3.5 on admission which has been improved now.  Patient was not on Coumadin.  Was given heparin subcu for DVT prophylaxis.  Hepatic function with mildly low total protein and albumin and rest normal.  Also found to have significantly elevated APTT which started improving today.  DIC labs and peripheral smear negative. Did receive 1 dose of IV vitamin K   Increased anion gap metabolic acidosis Present initially but now resolved with treatment  AKI (acute kidney  injury) With CKD 3B -at baseline now.  Give 80 mg of IV Lasix once as he was fluid overloaded with good response to diuresis  Lab Results  Component Value Date   CREATININE 0.94 01/21/2023   CREATININE 0.96 01/20/2023   CREATININE 1.03 01/18/2023     Essential hypertension, benign Blood pressure currently soft requiring midodrine  Hypothyroid TSH elevated at 7.7 -Increased the dose of Synthroid to 50 this admission  Adjustment disorder with depressed mood Fluoxetine 20 mg daily resumed  Hypercholesterolemia Continue statin  Mantle cell lymphoma Last chemo was in January 2024.  Being followed up by oncology as outpatient, patient reports chronic diarrhea since start of chemotherapy.   He also has pancytopenia which i could be due to his lymphoma/chemo  Diarrhea Unknown Etiology.  Possibly functional versus bile malabsorption versus chemo related.  GI did not have any further input and no further workup recommended. Continue Imodium.  I had started Florastor yesterday which seemed to help some.  Will add cholestyramine today Repeated CT abdomen and pelvis today which did not show any other acute pathology. I had spent at least 45-50 minutes talking with his sister over phone today and addressing her concerns  GERD (gastroesophageal reflux disease) Patient is having a lot of GERD issues and spitting out most of the medications. -GI was consulted for abnormal esophageal gram.  EGD with benign esophageal stenosis and narrowing of  GEJ junction which was dilated -Continue PPI twice daily -Follow-up with primary GI as outpatient-patient reports ongoing dysphagia.  Poor p.o. intake and chronic diarrhea with associated weight loss.  GI recommends outpatient follow-up  Diabetes CBG running low.  Will cut back on insulin Semglee 5 units and stop meal coverage as he is not eating much -Continue with SSI        Subjective: Still worried about his diarrhea.   Physical  Exam: Vitals:   01/22/23 0415 01/22/23 0857 01/22/23 1129 01/22/23 1600  BP: (!) 107/48 (!) 107/54 (!) 127/55 (!) 101/54  Pulse: 65 78 84 72  Resp: 20 16  16   Temp: 98.6 F (37 C) 98.4 F (36.9 C)  98 F (36.7 C)  TempSrc:  Oral  Oral  SpO2: 95% 97% 98% 93%  Weight:      Height:       General.  Frail elderly man, in no acute distress.  He has anasarca Pulmonary.  Lungs clear bilaterally, normal respiratory effort. CV.  Regular rate and rhythm, no JVD, rub or murmur. Abdomen.  Soft, nontender, nondistended, BS positive. CNS.  Alert and oriented .  No focal neurologic deficit. Extremities.  Bilateral LE Ace wrap, no erythema, 2+ lower extremity edema Psychiatry.  Judgment and insight appears normal.  Data Reviewed:  CT abdomen and pelvis showed no acute abdominal or pelvic pathology, Moderate bilateral pleural effusions with associated dependent atelectesis  Family Communication: Updated sister over phone.  I had 2-3 separate conversation totaling close to 60 minutes to go over her concerns  Disposition: Status is: Inpatient Remains inpatient appropriate because: Possible discharge to SNF on Monday per [facility]  Planned Discharge Destination: Skilled nursing facility   DVT prophylaxis-SCDs Time spent: 35 minutes  Author: Delfino Lovett, MD 01/22/2023 7:30 PM  For on call review [email or web address].

## 2023-01-22 NOTE — Assessment & Plan Note (Signed)
Unknown Etiology.  Possibly functional versus bile malabsorption versus chemo related.  GI did not have any further input and no further workup recommended. Continue Imodium.  I had started Florastor yesterday which seemed to help some.  Will add cholestyramine today Repeated CT abdomen and pelvis today which did not show any other acute pathology. I had spent at least 45-50 minutes talking with his sister over phone today and addressing her concerns

## 2023-01-22 NOTE — Assessment & Plan Note (Signed)
Continue statin. 

## 2023-01-22 NOTE — Progress Notes (Signed)
GI Inpatient Follow-up Note  Subjective:  Patient seen in follow-up for persistent diarrhea. No acute events overnight. He has not had any bowel movements so far today. He reports yesterday he had 6 bowel movements. He denies any fevers, chills, nausea, vomiting, abdominal pain, or rectal bleeding. No family at bedside.   Scheduled Inpatient Medications:   Chlorhexidine Gluconate Cloth  6 each Topical Daily   cholestyramine  4 g Oral BID   ferrous sulfate  325 mg Oral Q lunch   insulin aspart  0-15 Units Subcutaneous TID WC   insulin aspart  0-5 Units Subcutaneous QHS   insulin glargine-yfgn  5 Units Subcutaneous QHS   lactobacillus  1 g Oral TID WC   levothyroxine  50 mcg Oral QAC breakfast   midodrine  10 mg Oral TID WC   multivitamin with minerals  1 tablet Oral Daily   pantoprazole  40 mg Oral BID AC   pravastatin  40 mg Oral QHS   PRN Inpatient Medications:  dextrose, liver oil-zinc oxide, loperamide, ondansetron **OR** ondansetron (ZOFRAN) IV, mouth rinse  Review of Systems: Constitutional: Weight is stable.  Eyes: No changes in vision. ENT: No oral lesions, sore throat.  GI: see HPI.  Heme/Lymph: No easy bruising.  CV: No chest pain.  GU: No hematuria.  Integumentary: No rashes.  Neuro: No headaches.  Psych: No depression/anxiety.  Endocrine: No heat/cold intolerance.  Allergic/Immunologic: No urticaria.  Resp: No cough, SOB.  Musculoskeletal: No joint swelling.    Physical Examination: BP (!) 127/55   Pulse 84   Temp 98.4 F (36.9 C) (Oral)   Resp 16   Ht 5\' 10"  (1.778 m)   Wt 102.1 kg   SpO2 98%   BMI 32.30 kg/m  Gen: NAD, alert and oriented x 4 HEENT: PEERLA, EOMI, Neck: supple, no JVD or thyromegaly Chest: CTA bilaterally, no wheezes, crackles, or other adventitious sounds CV: RRR, no m/g/c/r Abd: soft, NT, ND, +BS in all four quadrants; no HSM, guarding, ridigity, or rebound tenderness Ext: no edema, well perfused with 2+ pulses, Skin: no rash  or lesions noted Lymph: no LAD  Data: Lab Results  Component Value Date   WBC 3.1 (L) 01/21/2023   HGB 10.2 (L) 01/21/2023   HCT 32.4 (L) 01/21/2023   MCV 94.2 01/21/2023   PLT 59 (L) 01/21/2023   Recent Labs  Lab 01/18/23 0935 01/20/23 0645 01/21/23 0445  HGB 10.7* 10.5* 10.2*   Lab Results  Component Value Date   NA 140 01/21/2023   K 4.1 01/21/2023   CL 106 01/21/2023   CO2 28 01/21/2023   BUN 11 01/21/2023   CREATININE 0.94 01/21/2023   Lab Results  Component Value Date   ALT 8 01/14/2023   AST 17 01/14/2023   ALKPHOS 77 01/14/2023   BILITOT 1.4 (H) 01/14/2023   Recent Labs  Lab 01/18/23 0935  INR 1.4*   CT abd/pelvis with contrast 01/22/2023: IMPRESSION: 1. No acute or focal lesion to explain the patient's symptoms. 2. Moderate bilateral pleural effusions with associated dependent airspace opacities. 3. Right nephrectomy. 4. Stable 12 mm simple cyst near the lower pole of the left kidney. 5. Periumbilical and left inguinal hernias contain fat without bowel. 6.  Aortic Atherosclerosis (ICD10-I70.0).  Assessment/Plan:  77 y/o Caucasian male with a PMH of recurrent Stage III mantle cell lymphoma, IDDM, HTN, HLD, and depression presented to the [facility] ED 4/2 for chief complaint of generalized weakness and hypotension. GI following for dysphagia with subsequent  esophagogram showing potential GEJ narrowing    Chronic diarrhea - most likely 2/2 side effects from chemotherapy, potential bile acid malabsorption versus functional diarrhea   Mild distal esophageal stricture s/p dilatation    Sepsis 2/2 cellulitis   Mantle cell lymphoma s/p chemotherapy    AKI   GERD   Presbyesophagus  Adjustment disorder with depressed mood  Recommendations:  - Chronic diarrhea is most likely 2/2 chemotherapy side effects versus potential interplay with bile acid malabsorption versus functional diarrhea. CT images from today reviewed with no findings of colitis or acute  abdominopelvic pathologies.  - Continue supportive care. Agree with addition of cholestyramine 4 grams PO BID. It is important to take other medications 1 hour before or 4 hours after cholestyramine. OK to continue Imodium as well PRN.  - Continue to monitor clinically - No plans for inpatient colonoscopy at this time - He would benefit from outpatient follow-up with GI office of his choice (Dr. Johnney Killian or [facility]). Defer to patient preference.  - GI will sign off and follow peripherally  Please call with questions or concerns.   Jacob Moores, PA-C [facility] [phone]

## 2023-01-22 NOTE — TOC Progression Note (Signed)
Transition of Care [facility]) - Progression Note    Patient Details  Name: Gary [name] MRN: [id number] Date of Birth: Mar 27, 1946  Transition of Care [facility]) CM/SW Contact  Garret Reddish, RN Phone Number: 01/22/2023, 3:11 PM  Clinical Narrative:   Noted that GI consulted on patient and made some recommendations.   I have spoken with Tiffany from [facility].  She will have an available bed for patient on Monday.  I have informed provider, patient and his sister [name] that [facility] will have a SNF bed available on Monday.   TOC will continue to follow for discharge planning.       Expected Discharge Plan:  (Pending Medical Workup) Barriers to Discharge: No Barriers Identified  Expected Discharge Plan and Services   Discharge Planning Services: CM Consult   Living arrangements for the past 2 months: Single Family Home                   DME Agency:  (Patient has a rolling walker and a single point cane at home.)                   Social Determinants of Health (SDOH) Interventions SDOH Screenings   Food Insecurity: No Food Insecurity (01/12/2023)  Housing: Low Risk  (01/12/2023)  Transportation Needs: No Transportation Needs (01/12/2023)  Recent Concern: Transportation Needs - Unmet Transportation Needs (01/11/2023)  Utilities: Not At Risk (01/12/2023)  Alcohol Screen: Low Risk  (11/27/2020)  Depression (PHQ2-9): High Risk (01/12/2023)  Financial Resource Strain: Low Risk  (03/10/2022)  Physical Activity: Inactive (03/10/2022)  Social Connections: Socially Isolated (03/10/2022)  Stress: No Stress Concern Present (03/10/2022)  Tobacco Use: High Risk (01/19/2023)    Readmission Risk Interventions    01/15/2023   11:37 AM 08/30/2021   10:48 AM  Readmission Risk Prevention Plan  Transportation Screening Complete Complete  PCP or Specialist Appt within 3-5 Days Complete Complete  HRI or Home Care Consult Complete Complete  Social Work Consult for Recovery Care  Planning/Counseling Complete Complete  Palliative Care Screening Not Applicable Not Applicable  Medication Review Oceanographer) Complete Complete

## 2023-01-22 NOTE — Assessment & Plan Note (Signed)
INR elevated at 3.5 on admission which has been improved now.  Patient was not on Coumadin.  Was given heparin subcu for DVT prophylaxis.  Hepatic function with mildly low total protein and albumin and rest normal.  Also found to have significantly elevated APTT which started improving today.  DIC labs and peripheral smear negative. Did receive 1 dose of IV vitamin K  

## 2023-01-22 NOTE — Assessment & Plan Note (Signed)
CBG running low.  Will cut back on insulin Semglee 5 units and stop meal coverage as he is not eating much -Continue with SSI 

## 2023-01-22 NOTE — Assessment & Plan Note (Signed)
Last chemo was in January 2024.  Being followed up by oncology as outpatient, patient reports chronic diarrhea since start of chemotherapy.   He also has pancytopenia which i could be due to his lymphoma/chemo

## 2023-01-22 NOTE — Progress Notes (Addendum)
Daily Progress Note   Patient Name: Gary [name]       Date: 01/22/2023 DOB: August 26, 1946  Age: 77 y.o. MRN#: [id number] Attending Physician: Delfino Lovett, MD Primary Care Physician: Dale Belcher, MD Admit Date: 01/12/2023  Reason for Consultation/Follow-up: Establishing goals of care  Subjective: Notes and labs reviewed. In to see patient. He is resting in bed. He called his sister on speakerphone. She would be his HPOA. And her husband would be secondary HPOA.   They discussed concerns with his diarrhea and desire to control this.  Plans in place for a contrasted CT shortly.  She discusses that prior to this admission he had battled cancer and is now in remission.  She states he is typically up and able to care for himself and his dog.  She discusses a desire to have answers to what would be causing his diarrhea if at all possible, and additionally to have medication to stop diarrhea if at all possible.  We discussed poor p.o. intake, and she states that he does not really eat and drink as he does not want to have diarrhea.  She states this also was affected by his swallowing which he is now had dilation.  We discussed his diagnosis, prognosis, GOC, EOL wishes disposition and options.  Created space and opportunity for patient  to explore thoughts and feelings regarding current medical information.   A detailed discussion was had today regarding advanced directives.  Concepts specific to code status, artifical feeding and hydration, IV antibiotics and rehospitalization were discussed.  The difference between an aggressive medical intervention path and a comfort care path was discussed.  Values and goals of care important to patient and family were attempted to be elicited.  Discussed  limitations of medical interventions to prolong quality of life in some situations and discussed the concept of human mortality.  They will continue to have conversations between themselves. currently continue full code/full scope status.  Discussed that Ladona Ridgel would return to service tomorrow and could follow-up with them to discuss further and answer any questions.  Discussed that I would return to service on Monday and could check in with him then if they are still admitted.     Length of Stay: 10  Current Medications: Scheduled Meds:   Chlorhexidine Gluconate Cloth  6 each Topical Daily  cholestyramine  4 g Oral QID   ferrous sulfate  325 mg Oral Q lunch   insulin aspart  0-15 Units Subcutaneous TID WC   insulin aspart  0-5 Units Subcutaneous QHS   insulin glargine-yfgn  5 Units Subcutaneous QHS   lactobacillus  1 g Oral TID WC   levothyroxine  50 mcg Oral QAC breakfast   midodrine  10 mg Oral TID WC   multivitamin with minerals  1 tablet Oral Daily   pantoprazole  40 mg Oral BID AC   pravastatin  40 mg Oral QHS    Continuous Infusions:   PRN Meds: dextrose, liver oil-zinc oxide, loperamide, ondansetron **OR** ondansetron (ZOFRAN) IV, mouth rinse  Physical Exam Pulmonary:     Effort: Pulmonary effort is normal.  Neurological:     Mental Status: He is alert.             Vital Signs: BP (!) 127/55   Pulse 84   Temp 98.4 F (36.9 C) (Oral)   Resp 16   Ht 5\' 10"  (1.778 m)   Wt 102.1 kg   SpO2 98%   BMI 32.30 kg/m  SpO2: SpO2: 98 % O2 Device: O2 Device: Room Air O2 Flow Rate: O2 Flow Rate (L/min): 2 L/min  Intake/output summary:  Intake/Output Summary (Last 24 hours) at 01/22/2023 1207 Last data filed at 01/22/2023 1100 Gross per 24 hour  Intake --  Output 1100 ml  Net -1100 ml   LBM: Last BM Date : 01/22/23 Baseline Weight: Weight: 93 kg Most recent weight: Weight: 102.1 kg        Patient Active Problem List   Diagnosis Date Noted   Increased anion  gap metabolic acidosis 01/14/2023   Elevated INR 01/13/2023   Pressure injury of skin 01/13/2023   Orthostatic hypotension 01/12/2023   Sepsis due to cellulitis 01/12/2023   AKI (acute kidney injury) 01/12/2023   Depression, major, single episode, mild 11/15/2022   Recurrent falls 08/29/2021   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    Left rib fracture_7th rib    Leukocytosis    HTN (hypertension)    Hypothyroid    CAD (coronary artery disease)    Porokeratosis 05/15/2021   Aortic atherosclerosis 05/05/2021   Emphysema lung 05/05/2021   Thrombocytopenia 04/29/2021   Splinter of foot without infection 02/23/2021   Arm laceration 02/09/2021   Hypokalemia 10/24/2020   Injury by nail 08/15/2020   Shingles 07/24/2020   Rash 07/02/2020   Colon cancer screening 07/02/2020   Sick sinus syndrome 12/28/2019   Acquired absence of kidney 09/13/2019   Edema of lower extremity 09/13/2019   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, or unspecified 09/13/2019   Proteinuria 09/13/2019   Lower extremity pain, bilateral    Cellulitis 08/18/2019   Left hip pain 07/16/2019   Pain due to onychomycosis of toenails of both feet 04/03/2019   Swelling of limb 02/07/2019   CKD (chronic kidney disease) stage 3, GFR 30-59 ml/min 09/16/2018   Left shoulder pain 09/16/2018   Chronic cholecystitis 12/29/2017   Graves disease 12/29/2017   Peripheral neuropathy 12/29/2017   S/p nephrectomy 12/29/2017   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   Symptomatic bradycardia 12/02/2017   CKD (chronic kidney disease), stage IIIa 12/02/2017   Chronic systolic CHF (congestive heart failure) 12/02/2017   Saturday night paralysis 11/12/2017   Hypoglycemia 10/28/2017   Goals of care, counseling/discussion 10/24/2017   Lymphedema 10/20/2017   Iron deficiency anemia  09/11/2017   Fall 08/13/2017   Humerus fracture 08/13/2017   Anemia 01/12/2017   Bilateral  carotid artery stenosis 06/29/2016   Hand laceration 12/16/2015   Adjustment disorder with depressed mood 08/11/2015   Cardiomyopathy, idiopathic 04/22/2015   CAD in native artery 11/02/2014   Mantle cell lymphoma 08/03/2014   History of colonic polyps 04/29/2014   Irregular heart beat 04/22/2014   SOB (shortness of breath) 04/22/2014   Stress 03/21/2014   PVC (premature ventricular contraction) 02/21/2014   GERD (gastroesophageal reflux disease) 10/23/2013   Gastroparesis 06/25/2013   Chest pain 04/13/2013   Thyroid disease 04/13/2013   Diabetes 04/13/2013   Essential hypertension, benign 04/13/2013   Hypercholesterolemia 04/13/2013   Constipation, unspecified 10/13/1999   Depression, unspecified 10/13/1999   Dry eye syndrome of bilateral lacrimal glands 10/13/1999   Chronic venous insufficiency 10/13/1999   Athscl heart disease of native coronary artery w/o ang pctrs 10/13/1999    Palliative Care Assessment & Plan    Recommendations/Plan: Continue full code/full scope status. If Cholestyramine and Imodium do not work for diarrhea, could consider Lomotil. Could consider outpatient palliative  Code Status:    Code Status Orders  (From admission, onward)           Start     Ordered   01/12/23 1730  Full code  Continuous       Question:  By:  Answer:  Other   01/12/23 1732           Code Status History     Date Active Date Inactive Code Status Order ID Comments User Context   08/29/2021 1032 09/13/2021 2203 Full Code [id number]  Lorretta Harp, MD ED   08/18/2019 1925 08/21/2019 2201 Full Code [id number]  Anselm Jungling, DO ED   12/03/2017 0235 12/11/2017 1855 Full Code [id number]  Oralia Manis, MD Inpatient   10/28/2017 1720 10/29/2017 1816 Full Code [id number]  Auburn Bilberry, MD Inpatient   01/12/2017 0238 01/13/2017 2044 Full Code [id number]  Hugelmeyer, Jon Gills, DO Inpatient      Thank you for allowing the Palliative Medicine Team to assist in the care of this  patient.    Morton Stall, NP  Please contact Palliative Medicine Team phone at [phone] for questions and concerns.

## 2023-01-22 NOTE — Assessment & Plan Note (Addendum)
With CKD 3B -at baseline now.  Give 80 mg of IV Lasix once as he was fluid overloaded with good response to diuresis  Lab Results  Component Value Date   CREATININE 0.94 01/21/2023   CREATININE 0.96 01/20/2023   CREATININE 1.03 01/18/2023

## 2023-01-22 NOTE — Plan of Care (Signed)
  Problem: Fluid Volume: Goal: Hemodynamic stability will improve Outcome: Progressing   Problem: Clinical Measurements: Goal: Diagnostic test results will improve Outcome: Progressing Goal: Signs and symptoms of infection will decrease Outcome: Progressing   Problem: Respiratory: Goal: Ability to maintain adequate ventilation will improve Outcome: Progressing   Problem: Education: Goal: Knowledge of General Education information will improve Description: Including pain rating scale, medication(s)/side effects and non-pharmacologic comfort measures Outcome: Progressing   Problem: Health Behavior/Discharge Planning: Goal: Ability to manage health-related needs will improve Outcome: Progressing   Problem: Clinical Measurements: Goal: Ability to maintain clinical measurements within normal limits will improve Outcome: Progressing Goal: Will remain free from infection Outcome: Progressing Goal: Diagnostic test results will improve Outcome: Progressing Goal: Respiratory complications will improve Outcome: Progressing Goal: Cardiovascular complication will be avoided Outcome: Progressing   Problem: Activity: Goal: Risk for activity intolerance will decrease Outcome: Progressing   Problem: Nutrition: Goal: Adequate nutrition will be maintained Outcome: Progressing   Problem: Coping: Goal: Level of anxiety will decrease Outcome: Progressing   Problem: Elimination: Goal: Will not experience complications related to bowel motility Outcome: Progressing Goal: Will not experience complications related to urinary retention Outcome: Progressing   Problem: Pain Managment: Goal: General experience of comfort will improve Outcome: Progressing   Problem: Safety: Goal: Ability to remain free from injury will improve Outcome: Progressing   Problem: Skin Integrity: Goal: Risk for impaired skin integrity will decrease Outcome: Progressing   Problem: Education: Goal: Ability  to describe self-care measures that may prevent or decrease complications (Diabetes Survival Skills Education) will improve Outcome: Progressing Goal: Individualized Educational Video(s) Outcome: Progressing   Problem: Coping: Goal: Ability to adjust to condition or change in health will improve Outcome: Progressing   Problem: Fluid Volume: Goal: Ability to maintain a balanced intake and output will improve Outcome: Progressing   Problem: Health Behavior/Discharge Planning: Goal: Ability to identify and utilize available resources and services will improve Outcome: Progressing Goal: Ability to manage health-related needs will improve Outcome: Progressing   Problem: Metabolic: Goal: Ability to maintain appropriate glucose levels will improve Outcome: Progressing   Problem: Nutritional: Goal: Maintenance of adequate nutrition will improve Outcome: Progressing Goal: Progress toward achieving an optimal weight will improve Outcome: Progressing   Problem: Skin Integrity: Goal: Risk for impaired skin integrity will decrease Outcome: Progressing   Problem: Tissue Perfusion: Goal: Adequacy of tissue perfusion will improve Outcome: Progressing   Problem: Education: Goal: Ability to describe self-care measures that may prevent or decrease complications (Diabetes Survival Skills Education) will improve Outcome: Progressing Goal: Individualized Educational Video(s) Outcome: Progressing   Problem: Cardiac: Goal: Ability to maintain an adequate cardiac output will improve Outcome: Progressing   Problem: Health Behavior/Discharge Planning: Goal: Ability to identify and utilize available resources and services will improve Outcome: Progressing Goal: Ability to manage health-related needs will improve Outcome: Progressing   Problem: Fluid Volume: Goal: Ability to achieve a balanced intake and output will improve Outcome: Progressing   Problem: Metabolic: Goal: Ability to  maintain appropriate glucose levels will improve Outcome: Progressing   Problem: Nutritional: Goal: Maintenance of adequate nutrition will improve Outcome: Progressing Goal: Maintenance of adequate weight for body size and type will improve Outcome: Progressing   Problem: Respiratory: Goal: Will regain and/or maintain adequate ventilation Outcome: Progressing   Problem: Urinary Elimination: Goal: Ability to achieve and maintain adequate renal perfusion and functioning will improve Outcome: Progressing   

## 2023-01-22 NOTE — Assessment & Plan Note (Signed)
Present initially but now resolved with treatment 

## 2023-01-23 DIAGNOSIS — N179 Acute kidney failure, unspecified: Secondary | ICD-10-CM | POA: Diagnosis not present

## 2023-01-23 DIAGNOSIS — L0390 Cellulitis, unspecified: Secondary | ICD-10-CM | POA: Diagnosis not present

## 2023-01-23 DIAGNOSIS — C8318 Mantle cell lymphoma, lymph nodes of multiple sites: Secondary | ICD-10-CM | POA: Diagnosis not present

## 2023-01-23 DIAGNOSIS — F4321 Adjustment disorder with depressed mood: Secondary | ICD-10-CM

## 2023-01-23 DIAGNOSIS — R197 Diarrhea, unspecified: Secondary | ICD-10-CM | POA: Diagnosis not present

## 2023-01-23 DIAGNOSIS — Z7189 Other specified counseling: Secondary | ICD-10-CM | POA: Diagnosis not present

## 2023-01-23 DIAGNOSIS — Z515 Encounter for palliative care: Secondary | ICD-10-CM | POA: Diagnosis not present

## 2023-01-23 LAB — BASIC METABOLIC PANEL
Anion gap: 9 (ref 5–15)
BUN: 12 mg/dL (ref 8–23)
CO2: 28 mmol/L (ref 22–32)
Calcium: 7.5 mg/dL — ABNORMAL LOW (ref 8.9–10.3)
Chloride: 99 mmol/L (ref 98–111)
Creatinine, Ser: 1.04 mg/dL (ref 0.61–1.24)
GFR, Estimated: >60 mL/min (ref >60)
Glucose, Bld: 177 mg/dL — ABNORMAL HIGH (ref 70–99)
Potassium: 4 mmol/L (ref 3.5–5.1)
Sodium: 136 mmol/L (ref 135–145)

## 2023-01-23 LAB — GLUCOSE, CAPILLARY
Glucose-Capillary: 176 mg/dL — ABNORMAL HIGH (ref 70–99)
Glucose-Capillary: 151 mg/dL — ABNORMAL HIGH (ref 70–99)
Glucose-Capillary: 113 mg/dL — ABNORMAL HIGH (ref 70–99)
Glucose-Capillary: 184 mg/dL — ABNORMAL HIGH (ref 70–99)
Glucose-Capillary: 152 mg/dL — ABNORMAL HIGH (ref 70–99)

## 2023-01-23 LAB — CBC
HCT: 31.3 % — ABNORMAL LOW (ref 39.0–52.0)
Hemoglobin: 10 g/dL — ABNORMAL LOW (ref 13.0–17.0)
MCH: 30 pg (ref 26.0–34.0)
MCHC: 31.9 g/dL (ref 30.0–36.0)
MCV: 94 fL (ref 80.0–100.0)
Platelets: 40 10*3/uL — ABNORMAL LOW (ref 150–400)
RBC: 3.33 MIL/uL — ABNORMAL LOW (ref 4.22–5.81)
RDW: 14.6 % (ref 11.5–15.5)
WBC: 2.6 10*3/uL — ABNORMAL LOW (ref 4.0–10.5)
nRBC: 0 % (ref 0.0–0.2)

## 2023-01-23 MED ORDER — FUROSEMIDE 10 MG/ML IJ SOLN
80.0000 mg | Freq: Once | INTRAMUSCULAR | Status: AC
  Administered 2023-01-23: 80 mg via INTRAVENOUS
  Filled 2023-01-23: qty 8

## 2023-01-23 NOTE — Assessment & Plan Note (Signed)
Fluoxetine 20 mg daily

## 2023-01-23 NOTE — Assessment & Plan Note (Signed)
Continue statin. 

## 2023-01-23 NOTE — Assessment & Plan Note (Signed)
Patient is having a lot of GERD issues and spitting out most of the medications. -GI was consulted for abnormal esophageal gram.  EGD with benign esophageal stenosis and narrowing of GEJ junction which was dilated -Continue PPI twice daily -Follow-up with primary GI as outpatient-patient reports ongoing dysphagia.  Poor p.o. intake and chronic diarrhea with associated weight loss.  GI recommends outpatient follow-up 

## 2023-01-23 NOTE — Progress Notes (Signed)
Daily Progress Note   Patient Name: Gary [name]       Date: 01/23/2023 DOB: 1946/03/10  Age: 77 y.o. MRN#: [id number] Attending Physician: Delfino Lovett, MD Primary Care Physician: Dale Victor, MD Admit Date: 01/12/2023  Reason for Consultation/Follow-up: Establishing goals of care  HPI/Brief Hospital Review:  77 y.o. male  with past medical history of recurrent stage III mantle cell lymphoma who has completed 3 cycles of Rituxan and Treanda with most recent PET scan 12/10/2022 showing significant interval disease improvement, IDT2DM, HTN, HLD and depression admitted from home on 01/12/2023 with increased weakness, hypotension and recurrent diarrhea.   Last oncology treatment completed in January, reports GI upset including diarrhea, N/V started around that time. Reports 5-6 episodes of diarrhea per day. Reports somewhat an improvement in frequency of episodes over the last month but continues to have 2-3 episodes of diarrhea per day. He also reports reduced oral consumption due to associated nausea and dysphagia.   Work-up revealing: Sepsis likely secondary to lower extremity cellulitis and possible PNA, started on IV antibiotic therapy   Underwent EGD (4/8) showing mild narrowing of GEJ, successful dilation, recommended continue PPI BID and follow-up as outpatient  Ongoing issues with diarrhea, CT abdomen (-) for cause of diarrhea, started on cholestyramine with reported improvements in diarrhea, GI recommended outpatient follow-up   Palliative medicine was consulted for assisting with goals of care conversations.   Subjective: Extensive chart review has been completed prior to meeting patient including labs, vital signs, imaging, progress notes, orders, and available advanced directive  documents from current and previous encounters.    Visited with Gary [name] at his bedside. Awake and alert, reports feeling much better with improved spirits since our initial meeting. Reports significant improvement in diarrhea over the last 24 hours. Also reports an improvement in dysphagia, tolerating breakfast well and improved ability to tolerate PO pills.  Soft neck collar in place due to severe weakness to cervical extensors as recommended by PT. Gary [name] denies discomfort or impendence on ability to tolerate PO foods/pills.  Called and spoke with Gary [name], reviewed progress since last update. Gary [name] able to share her frustrations and overall concern for her brother. Reviewed CT findings (-) for cause of diarrhea, reviewed mechanism of action of cholestyramine and that Gary [name] reports significant improvement since starting medication. Answered  and addressed all questions and concerns of Gary [name] regarding Gary [name]'s hospitalization and current plan of care.  Both Gary [name] and Gary [name] feel there has been an overall improvement and [name] shares he feels he has more energy now to continue fighting. He shares he wishes to continue to regain his strength and after SNF plans to return home to his dog. Wishes to remain Full Code at this time.  According to [facility], SNF bed likely available Monday at [facility].  Plan to meet with Gary [name] face to face at bedside tomorrow.  Symptom burden remains low at this time. Denies acute pain or discomfort.  PMT will continue to follow for ongoing needs and support.  Objective:  Physical Exam Constitutional:      General: He is not in acute distress.    Appearance: He is ill-appearing.  Pulmonary:     Effort: Pulmonary effort is normal. No respiratory distress.  Skin:    General: Skin is warm and dry.  Neurological:     Mental Status: He is alert.     Motor: Weakness present.             Vital Signs: BP (!) 105/48 (BP Location: Right Arm)    Pulse 78   Temp 98 F (36.7 C) (Oral)   Resp 16   Ht 5\' 10"  (1.778 m)   Wt 102.1 kg   SpO2 96%   BMI 32.30 kg/m  SpO2: SpO2: 96 % O2 Device: O2 Device: Room Air O2 Flow Rate: O2 Flow Rate (L/min): 2 L/min   Palliative Care Assessment & Plan   Assessment/Recommendation/Plan  Full Code/Full Scope Meet with Gary [name] at bedside tomorrow Introduce and recommend completion of MOST form with disposition likely SNF PMT to continue to follow for ongoing needs and support   Thank you for allowing the Palliative Medicine Team to assist in the care of this patient.  Total time:  50 minutes  Time spent includes: Detailed review of medical records (labs, imaging, vital signs), medically appropriate exam (mental status, respiratory, cardiac, skin), discussed with treatment team, counseling and educating patient, family and staff, documenting clinical information, medication management and coordination of care.  Gary Deed, DNP, AGNP-C Palliative Medicine   Please contact Palliative Medicine Team phone at [phone] for questions and concerns.

## 2023-01-23 NOTE — Assessment & Plan Note (Signed)
Unknown Etiology.  Possibly functional versus bile malabsorption versus chemo related.  GI did not have any further input and no further workup recommended. Continue Imodium, Florastor and cholestyramine Repeated CT abdomen and pelvis on 4/12 which did not show any other acute pathology.

## 2023-01-23 NOTE — Assessment & Plan Note (Signed)
TSH elevated at 7.7 -Increased the dose of Synthroid to 50 this admission 

## 2023-01-23 NOTE — Assessment & Plan Note (Signed)
INR elevated at 3.5 on admission which has been improved now.  Patient was not on Coumadin.  Was given heparin subcu for DVT prophylaxis.  Hepatic function with mildly low total protein and albumin and rest normal.  Also found to have significantly elevated APTT which started improving today.  DIC labs and peripheral smear negative. Did receive 1 dose of IV vitamin K  

## 2023-01-23 NOTE — Assessment & Plan Note (Signed)
Patient has elevated lactic acid of 2.1, leukopenia, elevated heart rate, low normal tensive, with possible source of infection, cellulitis of the right leg vs CAP Blood cultures remain negative, procalcitonin elevated.  Lactic acidosis resolved. Improving lower extremity cellulitis. Completed the course of ceftriaxone and Zithromax Treated also with Zyvox, discontinued on 4/10 

## 2023-01-23 NOTE — Assessment & Plan Note (Addendum)
Last chemo was in January 2024.  Being followed up by oncology as outpatient, patient reports chronic diarrhea since start of chemotherapy.   He also has pancytopenia which i could be due to his lymphoma/chemo Platelets dropping.  I have informed Dr. Orlie Dakin of same.  He may evaluate him on Monday if it continues to drop further     Latest Ref Rng & Units 01/23/2023    6:00 AM 01/21/2023    4:45 AM 01/20/2023    6:45 AM  CBC  WBC 4.0 - 10.5 K/uL 2.6  3.1  3.0   Hemoglobin 13.0 - 17.0 g/dL 41.7  40.8  14.4   Hematocrit 39.0 - 52.0 % 31.3  32.4  33.0   Platelets 150 - 400 K/uL 40  59  60

## 2023-01-23 NOTE — Assessment & Plan Note (Signed)
CBG running low.  Will cut back on insulin Semglee 5 units and stop meal coverage as he is not eating much -Continue with SSI 

## 2023-01-23 NOTE — Assessment & Plan Note (Signed)
Blood pressure currently soft requiring midodrine 

## 2023-01-23 NOTE — Assessment & Plan Note (Signed)
With CKD 3B -at baseline now.  Give 80 mg of IV Lasix again today as he was fluid overloaded showing bilateral effusion on CT   Lab Results  Component Value Date   CREATININE 1.04 01/23/2023   CREATININE 0.94 01/21/2023   CREATININE 0.96 01/20/2023

## 2023-01-23 NOTE — Progress Notes (Signed)
Progress Note   Patient: Gary [name] ZOX:[id number] DOB: 1946-09-11 DOA: 01/12/2023     11 DOS: the patient was seen and examined on 01/23/2023   Brief hospital course: Mr. Jaremy Nosal is a 77 year old male with history of recurrent stage III mantle cell lymphoma who has completed 43 cycles of Rituxan and Treanda, depression, insulin-dependent diabetes mellitus, hypertension, hyperlipidemia, who presents to the emergency department for chief concerns of generalized weakness and hypotension.  Patient is reported to have nausea and vomiting and diarrhea and this has been ongoing for the last several months  Vitals in the ED showed temperature of 98, respiration rate of 16, heart rate of 108, blood pressure 88/60, SpO2 of 99% on room air.  Serum sodium is 134, potassium 3.5, chloride 101, bicarb 19, BUN of 37, serum creatinine of 2.46, EGFR of 27, nonfasting blood glucose 70, WBC 3.3, hemoglobin 11.3, platelets of 92.  High sensitive troponin is 27.  Lactic acid is 2.1.  Blood cultures x 2 are in process.  ED treatment: Ceftriaxone 2 g IV one-time dose, lactated ringer 1 L bolus, vancomycin.  4/3:Labs with slight worsening of metabolic acidosis, creatinine with mild improvement to 2.43, worsening leukopenia and thrombocytopenia but all cell lines decreased-some dilutional effect.  On imaging which include CT head, cervical spine, CT abdominal and pelvis were negative for any acute abnormality.  INR at 3.5-discontinue DVT prophylaxis with heparin-SCDs ordered. Patient again becoming hypotensive-give him 1 more liter of bolus and also starting midodrine. Still no obvious source of infection as blood cultures remain negative in 24 hours. Will continue broad-spectrum antibiotics.  Concern of lower extremity cellulitis.  4/4: Blood pressure remained on softer side, midodrine dose was increased to 10 mg, 3 times daily.  GI pathogen panel negative, C. difficile negative, Blood cultures remain negative.   Labs with very slowly improving creatinine to 2.33, worsening now anion gap metabolic acidosis with bicarb at 14, mildly elevated T. bili at 1.4.  Lactic acid was normal but found to have elevated beta hydroxybutyric acid, concern of developing DKA. Rechecking BMP if Remained open we will start him on Endo tool. INR still elevated, APTT started improving.  DIC labs negative.  Vitamin K levels pending  Addendum.  Repeat BMP remained high anion gap metabolic acidosis with elevated blood glucose level.  Patient seems like in DKA.  Transferring to stepdown and starting on Endo tool.  4/5: Blood pressure remained little soft.  CBG improved and gap closed.  [name]  He was later managed with resistant sliding scale every 4 hourly.  Potassium was also repleted overnight.  A.m. labs with some improvement of creatinine to 1.99, bicarb still low at 20-infusion was discontinued overnight and he is now being restarted on p.o. bicarb supplement.  TSH found to be elevated at 7.715-Home Synthroid dose was increased to 50. Esophagogram was ordered by GI today.  4/6: Blood pressure remained on softer side.  CBG improved, converted to SSI with meals along with 10 units of Smeglee.  Esophageal gram with limited study due to patient's inability to position properly but did show patulous esophagus with dysmotility and spontaneous reflex.  Potentially GEJ narrowing.  Will need EGD after improvement in current acute illness.  GI pathogen panel negative.  4/7: Patient slowly improving.  GI planning EGD tomorrow with Dr. [name].  Awaiting SNF placement.  4/8: Hemodynamically stable.  EGD today shows mild narrowing of GEJ,  which was dilated with good results.  GI is recommending twice daily PPI and follow-up with primary GI as outpatient. Completed a course of antibiotics for concern of pneumonia.  Will continue on p.o. linezolid for total of 7  days.  4/9:Vitals stable.  Had 1 episode of hypoglycemia with CBG at 66 this morning.  Patient was n.p.o. most of the day yesterday.  Improved p.o. intake and regurgitation today. 4/10 : Nursing reported loose watery diarrhea last evening but upon further discussion with patient he has been having ongoing diarrhea for several months as well as dysphagia and associated weight loss due to poor p.o. intake.  Holding off C. difficile testing for now per discussion with ID. awaiting SNF placement.  Palliative care consult.  Stopping Zyvox 4/11: Reporting chronic diarrhea since start of chemotherapy.  Cutting back on insulin Semglee as blood sugar still running on the lower side 4/12: Reevaluated by GI for chronic diarrhea and also had a CT of abdomen and pelvis which does not show any acute pathology.  Added cholestyramine and probiotic/lactobacillus.  Ordered soft cervical collar per PT recommendation.  Give one-time dose of 80 mg of IV Lasix as he seems fluid overload. SNF on Monday 4/13: No bowel movement reported.  Discontinue telemetry.  Lasix 80 mg IV once  Assessment and Plan: * Sepsis due to cellulitis Patient has elevated lactic acid of 2.1, leukopenia, elevated heart rate, low normal tensive, with possible source of infection, cellulitis of the right leg vs CAP Blood cultures remain negative, procalcitonin elevated.  Lactic acidosis resolved. Improving lower extremity cellulitis. Completed the course of ceftriaxone and Zithromax Treated also with Zyvox, discontinued on 4/10  Elevated INR INR elevated at 3.5 on admission which has been improved now.  Patient was not on Coumadin.  Was given heparin subcu for DVT prophylaxis.  Hepatic function with mildly low total protein and albumin and rest normal.  Also found to have significantly elevated APTT which started improving today.  DIC labs and peripheral smear negative. Did receive 1 dose of IV vitamin K   Increased anion gap metabolic  acidosis Present initially but now resolved with treatment  AKI (acute kidney injury) With CKD 3B -at baseline now.  Give 80 mg of IV Lasix again today as he was fluid overloaded showing bilateral effusion on CT   Lab Results  Component Value Date   CREATININE 1.04 01/23/2023   CREATININE 0.94 01/21/2023   CREATININE 0.96 01/20/2023     Essential hypertension, benign Blood pressure currently soft requiring midodrine  Hypothyroid TSH elevated at 7.7 -Increased the dose of Synthroid to 50 this admission  Adjustment disorder with depressed mood Fluoxetine 20 mg daily   Hypercholesterolemia Continue statin  Mantle cell lymphoma Last chemo was in January 2024.  Being followed up by oncology as outpatient, patient reports chronic diarrhea since start of chemotherapy.   He also has pancytopenia which i could be due to his lymphoma/chemo Platelets dropping.  I have informed Dr. Orlie Dakin of same.  He may evaluate him on Monday if it continues to drop further     Latest Ref Rng & Units 01/23/2023    6:00 AM 01/21/2023    4:45 AM 01/20/2023    6:45 AM  CBC  WBC 4.0 - 10.5 K/uL 2.6  3.1  3.0   Hemoglobin 13.0 - 17.0 g/dL 90.3  00.9  23.3   Hematocrit 39.0 - 52.0 % 31.3  32.4  33.0   Platelets 150 - 400 K/uL 40  59  60      Diarrhea Unknown Etiology.  Possibly functional versus bile malabsorption versus chemo related.  GI did not have any further input and no further workup recommended. Continue Imodium, Florastor and cholestyramine Repeated CT abdomen and pelvis on 4/12 which did not show any other acute pathology.  GERD (gastroesophageal reflux disease) Patient is having a lot of GERD issues and spitting out most of the medications. -GI was consulted for abnormal esophageal gram.  EGD with benign esophageal stenosis and narrowing of GEJ junction which was dilated -Continue PPI twice daily -Follow-up with primary GI as outpatient-patient reports ongoing dysphagia.  Poor p.o.  intake and chronic diarrhea with associated weight loss.  GI recommends outpatient follow-up  Diabetes CBG running low.  Will cut back on insulin Semglee 5 units and stop meal coverage as he is not eating much -Continue with SSI        Subjective: Reports no further diarrhea  Physical Exam: Vitals:   01/22/23 2102 01/22/23 2355 01/23/23 0551 01/23/23 0733  BP: (!) 118/58 (!) 106/54 (!) 101/44 (!) 105/48  Pulse: 64 77 75 78  Resp: 19 16 18 16   Temp: 98.6 F (37 C) 97.6 F (36.4 C) 98.6 F (37 C) 98 F (36.7 C)  TempSrc:  Oral  Oral  SpO2: 98% 96% 94% 96%  Weight:      Height:       General.  Frail elderly man, in no acute distress.  He has anasarca Pulmonary.  Lungs clear bilaterally, normal respiratory effort. CV.  Regular rate and rhythm, no JVD, rub or murmur. Abdomen.  Soft, nontender, nondistended, BS positive. CNS.  Alert and oriented .  No focal neurologic deficit. Extremities.  Bilateral LE Ace wrap, no erythema, 1+ lower extremity edema Psychiatry.  Judgment and insight appears normal.  Data Reviewed:  There are no new results to review at this time.  Family Communication: Sister updated over phone  Disposition: Status is: Inpatient Remains inpatient appropriate because: SNF on Monday depending on clinical condition.  Monitoring for thrombocytopenia and bowel movement  Planned Discharge Destination: Skilled nursing facility   DVT prophylaxis-SCDs Time spent: 35 minutes  Author: Delfino Lovett, MD 01/23/2023 1:14 PM  For on call review [email or web address].

## 2023-01-23 NOTE — Assessment & Plan Note (Signed)
Present initially but now resolved with treatment 

## 2023-01-24 DIAGNOSIS — R791 Abnormal coagulation profile: Secondary | ICD-10-CM | POA: Diagnosis not present

## 2023-01-24 DIAGNOSIS — Z7189 Other specified counseling: Secondary | ICD-10-CM | POA: Diagnosis not present

## 2023-01-24 DIAGNOSIS — K219 Gastro-esophageal reflux disease without esophagitis: Secondary | ICD-10-CM | POA: Diagnosis not present

## 2023-01-24 DIAGNOSIS — L0390 Cellulitis, unspecified: Secondary | ICD-10-CM | POA: Diagnosis not present

## 2023-01-24 DIAGNOSIS — R197 Diarrhea, unspecified: Secondary | ICD-10-CM | POA: Diagnosis not present

## 2023-01-24 DIAGNOSIS — A419 Sepsis, unspecified organism: Secondary | ICD-10-CM | POA: Diagnosis not present

## 2023-01-24 DIAGNOSIS — Z515 Encounter for palliative care: Secondary | ICD-10-CM | POA: Diagnosis not present

## 2023-01-24 LAB — GLUCOSE, CAPILLARY
Glucose-Capillary: 236 mg/dL — ABNORMAL HIGH (ref 70–99)
Glucose-Capillary: 210 mg/dL — ABNORMAL HIGH (ref 70–99)
Glucose-Capillary: 201 mg/dL — ABNORMAL HIGH (ref 70–99)
Glucose-Capillary: 162 mg/dL — ABNORMAL HIGH (ref 70–99)

## 2023-01-24 NOTE — Assessment & Plan Note (Signed)
Unknown Etiology.  Possibly functional versus bile malabsorption versus chemo related. Continue Imodium, Florastor and cholestyramine -seem to have been responsive to Florastor and cholestyramine.  Patient reported only 1 bowel movement in last 2 days Repeated CT abdomen and pelvis on 4/12 which did not show any other acute pathology.

## 2023-01-24 NOTE — Assessment & Plan Note (Signed)
Patient has elevated lactic acid of 2.1, leukopenia, elevated heart rate, low normal tensive, with possible source of infection, cellulitis of the right leg vs CAP Blood cultures remain negative, procalcitonin elevated.  Lactic acidosis resolved. Improving lower extremity cellulitis. Completed the course of ceftriaxone and Zithromax Treated also with Zyvox, discontinued on 4/10 

## 2023-01-24 NOTE — Assessment & Plan Note (Signed)
Continue statin. 

## 2023-01-24 NOTE — Assessment & Plan Note (Signed)
TSH elevated at 7.7 -Increased the dose of Synthroid to 50 this admission 

## 2023-01-24 NOTE — Assessment & Plan Note (Signed)
Last chemo was in January 2024.  Being followed up by oncology as outpatient, patient reports chronic diarrhea since start of chemotherapy.   He also has pancytopenia which i could be due to his lymphoma/chemo Platelets dropping. Dr. Orlie Dakin is aware.  He may evaluate him on Monday if it continues to drop further     Latest Ref Rng & Units 01/23/2023    6:00 AM 01/21/2023    4:45 AM 01/20/2023    6:45 AM  CBC  WBC 4.0 - 10.5 K/uL 2.6  3.1  3.0   Hemoglobin 13.0 - 17.0 g/dL 97.9  89.2  11.9   Hematocrit 39.0 - 52.0 % 31.3  32.4  33.0   Platelets 150 - 400 K/uL 40  59  60

## 2023-01-24 NOTE — Assessment & Plan Note (Signed)
Patient is having a lot of GERD issues and spitting out most of the medications. -GI was consulted for abnormal esophageal gram.  EGD with benign esophageal stenosis and narrowing of GEJ junction which was dilated -Continue PPI twice daily -Follow-up with primary GI as outpatient-patient reports ongoing dysphagia.  Poor p.o. intake and chronic diarrhea with associated weight loss.  GI recommends outpatient follow-up

## 2023-01-24 NOTE — Progress Notes (Signed)
Daily Progress Note   Patient Name: Gary [name]       Date: 01/24/2023 DOB: Jul 12, 1946  Age: 77 y.o. MRN#: [id number] Attending Physician: Delfino Lovett, MD Primary Care Physician: [place], MD Admit Date: 01/12/2023  Reason for Consultation/Follow-up: Establishing goals of care  HPI/Brief Hospital Review:  77 y.o. male  with past medical history of recurrent stage III mantle cell lymphoma who has completed 3 cycles of Rituxan and Treanda with most recent PET scan 12/10/2022 showing significant interval disease improvement, IDT2DM, HTN, HLD and depression admitted from home on 01/12/2023 with increased weakness, hypotension and recurrent diarrhea.   Last oncology treatment completed in January, reports GI upset including diarrhea, N/V started around that time. Reports 5-6 episodes of diarrhea per day. Reports somewhat an improvement in frequency of episodes over the last month but continues to have 2-3 episodes of diarrhea per day. He also reports reduced oral consumption due to associated nausea and dysphagia.   Work-up revealing: Sepsis likely secondary to lower extremity cellulitis and possible PNA, started on IV antibiotic therapy   Underwent EGD (4/8) showing mild narrowing of GEJ, successful dilation, recommended continue PPI BID and follow-up as outpatient   Ongoing issues with diarrhea, CT abdomen (-) for cause of diarrhea, started on cholestyramine with reported improvements in diarrhea, GI recommended outpatient follow-up   Palliative medicine was consulted for assisting with goals of care conversations.  Subjective: Extensive chart review has been completed prior to meeting patient including labs, vital signs, imaging, progress notes, orders, and available advanced directive  documents from current and previous encounters.    Visited with Gary [name] at his bedside. Awake and alert, finishing up his lunch. Reports ongoing improvements in frequency of diarrhea as well as dysphagia. Shares that PT assisted him to recliner earlier today, felt he sat up for too long as sitting in recliner caused discomfort in his legs. On return to bed he shares he was surprised at his profound weakness. He remains hopeful that he will be able to regain his strength and independence during his stay at [facility]. Plan for d/c to SNF Monday.  Susan-sister not at bedside during time of visit. [name] shares her appointment was delayed and she will likely be by to visit later this evening.  Visited again with Gary [name], Susan-sister visiting at  bedside. Answered and addressed all questions and concerns. Recommended outpatient palliative care services for which he agreed.  No ongoing acute palliative needs as this time.  Objective:  Vital Signs: BP (!) 93/47 (BP Location: Left Arm)   Pulse 92   Temp 97.9 F (36.6 C)   Resp 16   Ht 5\' 10"  (1.778 m)   Wt 102.1 kg   SpO2 95%   BMI 32.30 kg/m  SpO2: SpO2: 95 % O2 Device: O2 Device: Room Air O2 Flow Rate: O2 Flow Rate (L/min): 2 L/min   Palliative Care Assessment & Plan   Assessment/Recommendation/Plan  Full Code-Full Scope D/C SNF likely Monday TOC to coordinate setting up outpatient palliative care services  Thank you for allowing the Palliative Medicine Team to assist in the care of this patient.  Total time:  25 minutes  Time spent includes: Detailed review of medical records (labs, imaging, vital signs), medically appropriate exam (mental status, respiratory, cardiac, skin), discussed with treatment team, counseling and educating patient, family and staff, documenting clinical information, medication management and coordination of care.  Leeanne Deed, DNP, AGNP-C Palliative Medicine   Please contact Palliative Medicine Team  phone at [phone] for questions and concerns.

## 2023-01-24 NOTE — Assessment & Plan Note (Signed)
With CKD 3B -at baseline now.    Lab Results  Component Value Date   CREATININE 1.04 01/23/2023   CREATININE 0.94 01/21/2023   CREATININE 0.96 01/20/2023

## 2023-01-24 NOTE — Assessment & Plan Note (Signed)
CBG running low.  Will cut back on insulin Semglee 5 units and stop meal coverage as he is not eating much -Continue with SSI

## 2023-01-24 NOTE — Assessment & Plan Note (Signed)
Blood pressure currently soft requiring midodrine 

## 2023-01-24 NOTE — Progress Notes (Signed)
Progress Note   Patient: Gary [name]:[id number] DOB: 03/08/1946 DOA: 01/12/2023     12 DOS: the patient was seen and examined on 01/24/2023   Brief hospital course: [name] is a 77 year old male with history of recurrent stage III mantle cell lymphoma who has completed 43 cycles of Rituxan and Treanda, depression, insulin-dependent diabetes mellitus, hypertension, hyperlipidemia, who presents to the emergency department for chief concerns of generalized weakness and hypotension.  Patient is reported to have nausea and vomiting and diarrhea and this has been ongoing for the last several months  Vitals in the ED showed temperature of 98, respiration rate of 16, heart rate of 108, blood pressure 88/60, SpO2 of 99% on room air.  Serum sodium is 134, potassium 3.5, chloride 101, bicarb 19, BUN of 37, serum creatinine of 2.46, EGFR of 27, nonfasting blood glucose 70, WBC 3.3, hemoglobin 11.3, platelets of 92.  High sensitive troponin is 27.  Lactic acid is 2.1.  Blood cultures x 2 are in process.  ED treatment: Ceftriaxone 2 g IV one-time dose, lactated ringer 1 L bolus, vancomycin.  4/3:Labs with slight worsening of metabolic acidosis, creatinine with mild improvement to 2.43, worsening leukopenia and thrombocytopenia but all cell lines decreased-some dilutional effect.  On imaging which include CT head, cervical spine, CT abdominal and pelvis were negative for any acute abnormality.  INR at 3.5-discontinue DVT prophylaxis with heparin-SCDs ordered. Patient again becoming hypotensive-give him 1 more liter of bolus and also starting midodrine. Still no obvious source of infection as blood cultures remain negative in 24 hours. Will continue broad-spectrum antibiotics.  Concern of lower extremity cellulitis.  4/4: Blood pressure remained on softer side, midodrine dose was increased to 10 mg, 3 times daily.  GI pathogen panel negative, C. difficile negative, Blood cultures remain negative.   Labs with very slowly improving creatinine to 2.33, worsening now anion gap metabolic acidosis with bicarb at 14, mildly elevated T. bili at 1.4.  Lactic acid was normal but found to have elevated beta hydroxybutyric acid, concern of developing DKA. Rechecking BMP if Remained open we will start him on Endo tool. INR still elevated, APTT started improving.  DIC labs negative.  Vitamin K levels pending  Addendum.  Repeat BMP remained high anion gap metabolic acidosis with elevated blood glucose level.  Patient seems like in DKA.  Transferring to stepdown and starting on Endo tool.  4/5: Blood pressure remained little soft.  CBG improved and gap closed.  [name]  He was later managed with resistant sliding scale every 4 hourly.  Potassium was also repleted overnight.  A.m. labs with some improvement of creatinine to 1.99, bicarb still low at 20-infusion was discontinued overnight and he is now being restarted on p.o. bicarb supplement.  TSH found to be elevated at 7.715-Home Synthroid dose was increased to 50. Esophagogram was ordered by GI today.  4/6: Blood pressure remained on softer side.  CBG improved, converted to SSI with meals along with 10 units of Smeglee.  Esophageal gram with limited study due to patient's inability to position properly but did show patulous esophagus with dysmotility and spontaneous reflex.  Potentially GEJ narrowing.  Will need EGD after improvement in current acute illness.  GI pathogen panel negative.  4/7: Patient slowly improving.  GI planning EGD tomorrow with Dr. Timothy [name].  Awaiting SNF placement.  4/8: Hemodynamically stable.  EGD today shows mild narrowing of GEJ,  which was dilated with good results.  GI is recommending twice daily PPI and follow-up with primary GI as outpatient. Completed a course of antibiotics for concern of pneumonia.  Will continue on p.o. linezolid for total of 7  days.  4/9:Vitals stable.  Had 1 episode of hypoglycemia with CBG at 66 this morning.  Patient was n.p.o. most of the day yesterday.  Improved p.o. intake and regurgitation today. 4/10 : Nursing reported loose watery diarrhea last evening but upon further discussion with patient he has been having ongoing diarrhea for several months as well as dysphagia and associated weight loss due to poor p.o. intake.  Holding off C. difficile testing for now per discussion with ID. awaiting SNF placement.  Palliative care consult.  Stopping Zyvox 4/11: Reporting chronic diarrhea since start of chemotherapy.  Cutting back on insulin Semglee as blood sugar still running on the lower side 4/12: Reevaluated by GI for chronic diarrhea and also had a CT of abdomen and pelvis which does not show any acute pathology.  Added cholestyramine and probiotic/lactobacillus.  Ordered soft cervical collar per PT recommendation.  Give one-time dose of 80 mg of IV Lasix as he seems fluid overload. SNF on Monday 4/13: No bowel movement reported.  Discontinue telemetry.  Lasix 80 mg IV once 4/14: Reports only 1 bowel movement since yesterday  Assessment and Plan: * Sepsis due to cellulitis Patient has elevated lactic acid of 2.1, leukopenia, elevated heart rate, low normal tensive, with possible source of infection, cellulitis of the right leg vs CAP Blood cultures remain negative, procalcitonin elevated.  Lactic acidosis resolved. Improving lower extremity cellulitis. Completed the course of ceftriaxone and Zithromax Treated also with Zyvox, discontinued on 4/10  Elevated INR INR elevated at 3.5 on admission which has been improved now.  Patient was not on Coumadin.  Was given heparin subcu for DVT prophylaxis.  Hepatic function with mildly low total protein and albumin and rest normal.  Also found to have significantly elevated APTT which started improving today.  DIC labs and peripheral smear negative. Did receive 1 dose of IV  vitamin K   Increased anion gap metabolic acidosis Present initially but now resolved with treatment  AKI (acute kidney injury) With CKD 3B -at baseline now.    Lab Results  Component Value Date   CREATININE 1.04 01/23/2023   CREATININE 0.94 01/21/2023   CREATININE 0.96 01/20/2023     Essential hypertension, benign Blood pressure currently soft requiring midodrine  Hypothyroid TSH elevated at 7.7 -Increased the dose of Synthroid to 50 this admission  Adjustment disorder with depressed mood Fluoxetine 20 mg daily   Hypercholesterolemia Continue statin  Mantle cell lymphoma Last chemo was in January 2024.  Being followed up by oncology as outpatient, patient reports chronic diarrhea since start of chemotherapy.   He also has pancytopenia which i could be due to his lymphoma/chemo Platelets dropping. Dr. Orlie Dakin is aware.  He may evaluate him on Monday if it continues to drop further     Latest Ref Rng & Units 01/23/2023    6:00 AM 01/21/2023    4:45 AM 01/20/2023    6:45 AM  CBC  WBC 4.0 - 10.5 K/uL 2.6  3.1  3.0   Hemoglobin 13.0 - 17.0 g/dL 29.2  44.6  28.6   Hematocrit 39.0 - 52.0 % 31.3  32.4  33.0   Platelets 150 - 400 K/uL 40  59  60      Diarrhea Unknown Etiology.  Possibly functional  versus bile malabsorption versus chemo related. Continue Imodium, Florastor and cholestyramine -seem to have been responsive to Florastor and cholestyramine.  Patient reported only 1 bowel movement in last 2 days Repeated CT abdomen and pelvis on 4/12 which did not show any other acute pathology.  GERD (gastroesophageal reflux disease) Patient is having a lot of GERD issues and spitting out most of the medications. -GI was consulted for abnormal esophageal gram.  EGD with benign esophageal stenosis and narrowing of GEJ junction which was dilated -Continue PPI twice daily -Follow-up with primary GI as outpatient-patient reports ongoing dysphagia.  Poor p.o. intake and chronic  diarrhea with associated weight loss.  GI recommends outpatient follow-up  Diabetes CBG running low.  Will cut back on insulin Semglee 5 units and stop meal coverage as he is not eating much -Continue with SSI        Subjective: Reports no further diarrhea.  Only had 1 bowel movement the last 2 days.  Bilateral lower extremity dressing changed earlier this morning  Physical Exam: Vitals:   01/23/23 1707 01/23/23 2130 01/24/23 0529 01/24/23 0753  BP: 104/74 (!) 103/44 (!) 102/43 (!) 96/42  Pulse: 72 70 70 75  Resp: 16 18 16 16   Temp: 98.6 F (37 C) 98.1 F (36.7 C) 98.2 F (36.8 C) 98 F (36.7 C)  TempSrc: Oral Oral Oral   SpO2: 97% 97% 96% 93%  Weight:      Height:       General.  Frail elderly man, in no acute distress.  He has anasarca Pulmonary.  Lungs clear bilaterally, normal respiratory effort. CV.  Regular rate and rhythm, no JVD, rub or murmur. Abdomen.  Soft, nontender, nondistended, BS positive. CNS.  Alert and oriented .  No focal neurologic deficit. Extremities.  Bilateral LE Ace wrap, no erythema, 1+ lower extremity edema Psychiatry.  Judgment and insight appears normal.  Data Reviewed:  There are no new results to review at this time.  Family Communication: None at bedside  Disposition: Status is: Inpatient Remains inpatient appropriate because: Likely SNF tomorrow  Planned Discharge Destination: Skilled nursing facility   DVT prophylaxis-SCDs Time spent: 35 minutes  Author: Delfino Lovett, MD 01/24/2023 10:38 AM  For on call review [email or web address].

## 2023-01-24 NOTE — Assessment & Plan Note (Signed)
INR elevated at 3.5 on admission which has been improved now.  Patient was not on Coumadin.  Was given heparin subcu for DVT prophylaxis.  Hepatic function with mildly low total protein and albumin and rest normal.  Also found to have significantly elevated APTT which started improving today.  DIC labs and peripheral smear negative. Did receive 1 dose of IV vitamin K  

## 2023-01-24 NOTE — Assessment & Plan Note (Signed)
Present initially but now resolved with treatment 

## 2023-01-24 NOTE — Assessment & Plan Note (Signed)
Fluoxetine 20 mg daily

## 2023-01-24 NOTE — Plan of Care (Signed)
Dressing changes completed on bilateral lower legs.  Elastic wrap, gauze roll, and xeroform removed.  Bilateral legs cleaned with soap and water.  Right reg with open, bleeding fissures on anterior area of leg.  Skin on right lower leg circumferentially is red with non opened fissures.  Left lower leg is red with shallow fissures and no openly bleeding areas.  Both legs cleaned and dried, xeroform placed on areas on redness or bleeding.   Both lower legs wrapped with a roll of gauze and an elastic wrap. Patient tolerated dressing changes with no complaints from patient.

## 2023-01-25 DIAGNOSIS — N289 Disorder of kidney and ureter, unspecified: Secondary | ICD-10-CM | POA: Diagnosis not present

## 2023-01-25 DIAGNOSIS — R001 Bradycardia, unspecified: Secondary | ICD-10-CM | POA: Diagnosis not present

## 2023-01-25 DIAGNOSIS — E538 Deficiency of other specified B group vitamins: Secondary | ICD-10-CM | POA: Diagnosis not present

## 2023-01-25 DIAGNOSIS — E1043 Type 1 diabetes mellitus with diabetic autonomic (poly)neuropathy: Secondary | ICD-10-CM | POA: Diagnosis present

## 2023-01-25 DIAGNOSIS — J439 Emphysema, unspecified: Secondary | ICD-10-CM | POA: Diagnosis not present

## 2023-01-25 DIAGNOSIS — C8310 Mantle cell lymphoma, unspecified site: Secondary | ICD-10-CM | POA: Diagnosis not present

## 2023-01-25 DIAGNOSIS — Y92129 Unspecified place in nursing home as the place of occurrence of the external cause: Secondary | ICD-10-CM | POA: Diagnosis not present

## 2023-01-25 DIAGNOSIS — R296 Repeated falls: Secondary | ICD-10-CM | POA: Diagnosis not present

## 2023-01-25 DIAGNOSIS — Z66 Do not resuscitate: Secondary | ICD-10-CM | POA: Diagnosis not present

## 2023-01-25 DIAGNOSIS — E1022 Type 1 diabetes mellitus with diabetic chronic kidney disease: Secondary | ICD-10-CM | POA: Diagnosis not present

## 2023-01-25 DIAGNOSIS — Z043 Encounter for examination and observation following other accident: Secondary | ICD-10-CM | POA: Diagnosis not present

## 2023-01-25 DIAGNOSIS — R197 Diarrhea, unspecified: Secondary | ICD-10-CM | POA: Diagnosis not present

## 2023-01-25 DIAGNOSIS — K591 Functional diarrhea: Secondary | ICD-10-CM | POA: Diagnosis not present

## 2023-01-25 DIAGNOSIS — Y92009 Unspecified place in unspecified non-institutional (private) residence as the place of occurrence of the external cause: Secondary | ICD-10-CM | POA: Diagnosis not present

## 2023-01-25 DIAGNOSIS — E1165 Type 2 diabetes mellitus with hyperglycemia: Secondary | ICD-10-CM | POA: Diagnosis not present

## 2023-01-25 DIAGNOSIS — R8281 Pyuria: Secondary | ICD-10-CM | POA: Diagnosis not present

## 2023-01-25 DIAGNOSIS — R1311 Dysphagia, oral phase: Secondary | ICD-10-CM | POA: Diagnosis not present

## 2023-01-25 DIAGNOSIS — L0390 Cellulitis, unspecified: Secondary | ICD-10-CM | POA: Diagnosis not present

## 2023-01-25 DIAGNOSIS — Z794 Long term (current) use of insulin: Secondary | ICD-10-CM | POA: Diagnosis not present

## 2023-01-25 DIAGNOSIS — L03116 Cellulitis of left lower limb: Secondary | ICD-10-CM | POA: Diagnosis not present

## 2023-01-25 DIAGNOSIS — W19XXXA Unspecified fall, initial encounter: Secondary | ICD-10-CM | POA: Diagnosis not present

## 2023-01-25 DIAGNOSIS — L89322 Pressure ulcer of left buttock, stage 2: Secondary | ICD-10-CM | POA: Diagnosis present

## 2023-01-25 DIAGNOSIS — D696 Thrombocytopenia, unspecified: Secondary | ICD-10-CM | POA: Diagnosis not present

## 2023-01-25 DIAGNOSIS — L89156 Pressure-induced deep tissue damage of sacral region: Secondary | ICD-10-CM | POA: Diagnosis present

## 2023-01-25 DIAGNOSIS — I951 Orthostatic hypotension: Secondary | ICD-10-CM | POA: Diagnosis not present

## 2023-01-25 DIAGNOSIS — I10 Essential (primary) hypertension: Secondary | ICD-10-CM | POA: Diagnosis not present

## 2023-01-25 DIAGNOSIS — E559 Vitamin D deficiency, unspecified: Secondary | ICD-10-CM | POA: Diagnosis not present

## 2023-01-25 DIAGNOSIS — S0003XA Contusion of scalp, initial encounter: Secondary | ICD-10-CM | POA: Diagnosis not present

## 2023-01-25 DIAGNOSIS — N1832 Chronic kidney disease, stage 3b: Secondary | ICD-10-CM | POA: Diagnosis not present

## 2023-01-25 DIAGNOSIS — Z515 Encounter for palliative care: Secondary | ICD-10-CM | POA: Diagnosis not present

## 2023-01-25 DIAGNOSIS — L89312 Pressure ulcer of right buttock, stage 2: Secondary | ICD-10-CM | POA: Diagnosis present

## 2023-01-25 DIAGNOSIS — K219 Gastro-esophageal reflux disease without esophagitis: Secondary | ICD-10-CM | POA: Diagnosis not present

## 2023-01-25 DIAGNOSIS — I509 Heart failure, unspecified: Secondary | ICD-10-CM | POA: Diagnosis present

## 2023-01-25 DIAGNOSIS — I130 Hypertensive heart and chronic kidney disease with heart failure and stage 1 through stage 4 chronic kidney disease, or unspecified chronic kidney disease: Secondary | ICD-10-CM | POA: Diagnosis not present

## 2023-01-25 DIAGNOSIS — I2510 Atherosclerotic heart disease of native coronary artery without angina pectoris: Secondary | ICD-10-CM | POA: Diagnosis not present

## 2023-01-25 DIAGNOSIS — E10311 Type 1 diabetes mellitus with unspecified diabetic retinopathy with macular edema: Secondary | ICD-10-CM | POA: Diagnosis not present

## 2023-01-25 DIAGNOSIS — E1042 Type 1 diabetes mellitus with diabetic polyneuropathy: Secondary | ICD-10-CM | POA: Diagnosis present

## 2023-01-25 DIAGNOSIS — R791 Abnormal coagulation profile: Secondary | ICD-10-CM | POA: Diagnosis not present

## 2023-01-25 DIAGNOSIS — D630 Anemia in neoplastic disease: Secondary | ICD-10-CM | POA: Diagnosis present

## 2023-01-25 DIAGNOSIS — D631 Anemia in chronic kidney disease: Secondary | ICD-10-CM | POA: Diagnosis not present

## 2023-01-25 DIAGNOSIS — I9589 Other hypotension: Secondary | ICD-10-CM | POA: Diagnosis not present

## 2023-01-25 DIAGNOSIS — L03115 Cellulitis of right lower limb: Secondary | ICD-10-CM | POA: Diagnosis not present

## 2023-01-25 DIAGNOSIS — N179 Acute kidney failure, unspecified: Secondary | ICD-10-CM | POA: Diagnosis not present

## 2023-01-25 DIAGNOSIS — W0110XA Fall on same level from slipping, tripping and stumbling with subsequent striking against unspecified object, initial encounter: Secondary | ICD-10-CM | POA: Diagnosis present

## 2023-01-25 DIAGNOSIS — S12601A Unspecified nondisplaced fracture of seventh cervical vertebra, initial encounter for closed fracture: Secondary | ICD-10-CM | POA: Diagnosis not present

## 2023-01-25 DIAGNOSIS — I959 Hypotension, unspecified: Secondary | ICD-10-CM | POA: Diagnosis not present

## 2023-01-25 DIAGNOSIS — R531 Weakness: Secondary | ICD-10-CM | POA: Diagnosis present

## 2023-01-25 DIAGNOSIS — A419 Sepsis, unspecified organism: Secondary | ICD-10-CM | POA: Diagnosis not present

## 2023-01-25 DIAGNOSIS — E119 Type 2 diabetes mellitus without complications: Secondary | ICD-10-CM | POA: Diagnosis not present

## 2023-01-25 DIAGNOSIS — F320 Major depressive disorder, single episode, mild: Secondary | ICD-10-CM | POA: Diagnosis not present

## 2023-01-25 DIAGNOSIS — E1122 Type 2 diabetes mellitus with diabetic chronic kidney disease: Secondary | ICD-10-CM | POA: Diagnosis not present

## 2023-01-25 DIAGNOSIS — D72819 Decreased white blood cell count, unspecified: Secondary | ICD-10-CM | POA: Diagnosis not present

## 2023-01-25 DIAGNOSIS — S0101XA Laceration without foreign body of scalp, initial encounter: Secondary | ICD-10-CM | POA: Diagnosis not present

## 2023-01-25 DIAGNOSIS — R0600 Dyspnea, unspecified: Secondary | ICD-10-CM | POA: Diagnosis not present

## 2023-01-25 DIAGNOSIS — D126 Benign neoplasm of colon, unspecified: Secondary | ICD-10-CM | POA: Diagnosis not present

## 2023-01-25 DIAGNOSIS — E0500 Thyrotoxicosis with diffuse goiter without thyrotoxic crisis or storm: Secondary | ICD-10-CM | POA: Diagnosis present

## 2023-01-25 DIAGNOSIS — E109 Type 1 diabetes mellitus without complications: Secondary | ICD-10-CM | POA: Diagnosis not present

## 2023-01-25 DIAGNOSIS — K3184 Gastroparesis: Secondary | ICD-10-CM | POA: Diagnosis present

## 2023-01-25 DIAGNOSIS — N1831 Chronic kidney disease, stage 3a: Secondary | ICD-10-CM | POA: Diagnosis not present

## 2023-01-25 DIAGNOSIS — Z87891 Personal history of nicotine dependence: Secondary | ICD-10-CM | POA: Diagnosis not present

## 2023-01-25 DIAGNOSIS — Y9301 Activity, walking, marching and hiking: Secondary | ICD-10-CM | POA: Diagnosis present

## 2023-01-25 DIAGNOSIS — C8315 Mantle cell lymphoma, lymph nodes of inguinal region and lower limb: Secondary | ICD-10-CM | POA: Diagnosis not present

## 2023-01-25 DIAGNOSIS — F32A Depression, unspecified: Secondary | ICD-10-CM | POA: Diagnosis present

## 2023-01-25 DIAGNOSIS — Z950 Presence of cardiac pacemaker: Secondary | ICD-10-CM | POA: Diagnosis not present

## 2023-01-25 DIAGNOSIS — E1040 Type 1 diabetes mellitus with diabetic neuropathy, unspecified: Secondary | ICD-10-CM | POA: Diagnosis not present

## 2023-01-25 DIAGNOSIS — D638 Anemia in other chronic diseases classified elsewhere: Secondary | ICD-10-CM | POA: Diagnosis present

## 2023-01-25 DIAGNOSIS — Z7189 Other specified counseling: Secondary | ICD-10-CM | POA: Diagnosis not present

## 2023-01-25 DIAGNOSIS — E039 Hypothyroidism, unspecified: Secondary | ICD-10-CM | POA: Diagnosis not present

## 2023-01-25 DIAGNOSIS — R6510 Systemic inflammatory response syndrome (SIRS) of non-infectious origin without acute organ dysfunction: Secondary | ICD-10-CM | POA: Insufficient documentation

## 2023-01-25 DIAGNOSIS — L89152 Pressure ulcer of sacral region, stage 2: Secondary | ICD-10-CM | POA: Insufficient documentation

## 2023-01-25 DIAGNOSIS — L89159 Pressure ulcer of sacral region, unspecified stage: Secondary | ICD-10-CM | POA: Diagnosis not present

## 2023-01-25 DIAGNOSIS — E7800 Pure hypercholesterolemia, unspecified: Secondary | ICD-10-CM | POA: Diagnosis not present

## 2023-01-25 DIAGNOSIS — D122 Benign neoplasm of ascending colon: Secondary | ICD-10-CM | POA: Diagnosis not present

## 2023-01-25 DIAGNOSIS — D61818 Other pancytopenia: Secondary | ICD-10-CM | POA: Diagnosis present

## 2023-01-25 DIAGNOSIS — N390 Urinary tract infection, site not specified: Secondary | ICD-10-CM | POA: Diagnosis present

## 2023-01-25 DIAGNOSIS — S199XXA Unspecified injury of neck, initial encounter: Secondary | ICD-10-CM | POA: Diagnosis not present

## 2023-01-25 DIAGNOSIS — W19XXXD Unspecified fall, subsequent encounter: Secondary | ICD-10-CM | POA: Diagnosis not present

## 2023-01-25 DIAGNOSIS — Z9181 History of falling: Secondary | ICD-10-CM | POA: Diagnosis not present

## 2023-01-25 DIAGNOSIS — M542 Cervicalgia: Secondary | ICD-10-CM | POA: Diagnosis not present

## 2023-01-25 DIAGNOSIS — D649 Anemia, unspecified: Secondary | ICD-10-CM | POA: Diagnosis not present

## 2023-01-25 DIAGNOSIS — W01198A Fall on same level from slipping, tripping and stumbling with subsequent striking against other object, initial encounter: Secondary | ICD-10-CM | POA: Diagnosis not present

## 2023-01-25 DIAGNOSIS — R112 Nausea with vomiting, unspecified: Secondary | ICD-10-CM | POA: Diagnosis not present

## 2023-01-25 DIAGNOSIS — S12691A Other nondisplaced fracture of seventh cervical vertebra, initial encounter for closed fracture: Secondary | ICD-10-CM | POA: Diagnosis not present

## 2023-01-25 DIAGNOSIS — I5022 Chronic systolic (congestive) heart failure: Secondary | ICD-10-CM | POA: Diagnosis not present

## 2023-01-25 DIAGNOSIS — D510 Vitamin B12 deficiency anemia due to intrinsic factor deficiency: Secondary | ICD-10-CM | POA: Diagnosis not present

## 2023-01-25 DIAGNOSIS — I110 Hypertensive heart disease with heart failure: Secondary | ICD-10-CM | POA: Diagnosis present

## 2023-01-25 DIAGNOSIS — K529 Noninfective gastroenteritis and colitis, unspecified: Secondary | ICD-10-CM | POA: Diagnosis not present

## 2023-01-25 DIAGNOSIS — C8318 Mantle cell lymphoma, lymph nodes of multiple sites: Secondary | ICD-10-CM | POA: Diagnosis not present

## 2023-01-25 DIAGNOSIS — K635 Polyp of colon: Secondary | ICD-10-CM | POA: Diagnosis not present

## 2023-01-25 DIAGNOSIS — Z7401 Bed confinement status: Secondary | ICD-10-CM | POA: Diagnosis not present

## 2023-01-25 DIAGNOSIS — S12631A Unspecified traumatic nondisplaced spondylolisthesis of seventh cervical vertebra, initial encounter for closed fracture: Secondary | ICD-10-CM | POA: Diagnosis not present

## 2023-01-25 DIAGNOSIS — S0990XA Unspecified injury of head, initial encounter: Secondary | ICD-10-CM | POA: Diagnosis not present

## 2023-01-25 DIAGNOSIS — E1065 Type 1 diabetes mellitus with hyperglycemia: Secondary | ICD-10-CM | POA: Diagnosis present

## 2023-01-25 LAB — BASIC METABOLIC PANEL
Anion gap: 7 (ref 5–15)
BUN: 12 mg/dL (ref 8–23)
CO2: 31 mmol/L (ref 22–32)
Calcium: 7.5 mg/dL — ABNORMAL LOW (ref 8.9–10.3)
Chloride: 100 mmol/L (ref 98–111)
Creatinine, Ser: 1.03 mg/dL (ref 0.61–1.24)
GFR, Estimated: >60 mL/min (ref >60)
Glucose, Bld: 123 mg/dL — ABNORMAL HIGH (ref 70–99)
Potassium: 3.8 mmol/L (ref 3.5–5.1)
Sodium: 138 mmol/L (ref 135–145)

## 2023-01-25 LAB — CBC
HCT: 28.1 % — ABNORMAL LOW (ref 39.0–52.0)
Hemoglobin: 9.1 g/dL — ABNORMAL LOW (ref 13.0–17.0)
MCH: 30.1 pg (ref 26.0–34.0)
MCHC: 32.4 g/dL (ref 30.0–36.0)
MCV: 93 fL (ref 80.0–100.0)
Platelets: 44 10*3/uL — ABNORMAL LOW (ref 150–400)
RBC: 3.02 MIL/uL — ABNORMAL LOW (ref 4.22–5.81)
RDW: 13.9 % (ref 11.5–15.5)
WBC: 2.2 10*3/uL — ABNORMAL LOW (ref 4.0–10.5)
nRBC: 0 % (ref 0.0–0.2)

## 2023-01-25 LAB — GLUCOSE, CAPILLARY
Glucose-Capillary: 151 mg/dL — ABNORMAL HIGH (ref 70–99)
Glucose-Capillary: 179 mg/dL — ABNORMAL HIGH (ref 70–99)
Glucose-Capillary: 189 mg/dL — ABNORMAL HIGH (ref 70–99)

## 2023-01-25 MED ORDER — HEPARIN SOD (PORK) LOCK FLUSH 100 UNIT/ML IV SOLN
500.0000 [IU] | Freq: Once | INTRAVENOUS | Status: AC
  Administered 2023-01-25: 500 [IU] via INTRAVENOUS
  Filled 2023-01-25: qty 5

## 2023-01-25 MED ORDER — FLORANEX PO PACK: 1.0000 g | PACK | Freq: Three times a day (TID) | ORAL | Status: DC

## 2023-01-25 MED ORDER — LEVOTHYROXINE SODIUM 50 MCG PO TABS: 50.0000 ug | ORAL_TABLET | Freq: Every day | ORAL | Status: DC

## 2023-01-25 MED ORDER — LOPERAMIDE HCL 2 MG PO CAPS: 2.0000 mg | ORAL_CAPSULE | ORAL | 0 refills | Status: DC | PRN

## 2023-01-25 MED ORDER — PANTOPRAZOLE SODIUM 40 MG PO TBEC: 40.0000 mg | DELAYED_RELEASE_TABLET | Freq: Two times a day (BID) | ORAL | Status: DC

## 2023-01-25 MED ORDER — MIDODRINE HCL 10 MG PO TABS: 10.0000 mg | ORAL_TABLET | Freq: Three times a day (TID) | ORAL | Status: DC

## 2023-01-25 MED ORDER — CHOLESTYRAMINE 4 G PO PACK: 4.0000 g | PACK | Freq: Two times a day (BID) | ORAL | 12 refills | Status: DC

## 2023-01-25 NOTE — Progress Notes (Signed)
Physical Therapy Treatment Patient Details Name: Gary [name] MRN: [id number] DOB: [birth year] Today's Date: 01/25/2023   History of Present Illness Granderson Fladger is a 77 y/o male with PMH of lymphoma, depression, DM2, HTN, COPD, HLD. Admitted with sepsis; weakness, N/V, diarrhea.    PT Comments    Pt asleep on entry, awakens to voice. Pt reports beign issued his soft cervical collar on Friday and has used it intermittently for comfort with success. Today in session, it appears helpful at times, but at EOB, its insufficient and causes some pain at front of neck and increased perceived dyspnea- elected to remove for latter half of session. Pt assisted with AROM exercises in bed- appears both amplitude and force of movements are improving. He is able to perform SLR and some bridging both of which are higher effort. Pt able to STS from elevated EOB x3, side step at counter x3. He quickly is exerted each time, SOB, but sats remain WNL. He does have HR 60bpm during exertion which cannot be excluded as a symptom provoking factor. Pt remains limited, but is improving. He remains very motivated. His lack of cervical extensor strength/endurance is concerning and the biggest limiting factor in tolerance to upright activity. His upper thoracic kyphosis is more easily seen today. Plan is for DC to STR later today.    Recommendations for follow up therapy are one component of a multi-disciplinary discharge planning process, led by the attending physician.  Recommendations may be updated based on patient status, additional functional criteria and insurance authorization.  Follow Up Recommendations  Can patient physically be transported by private vehicle: No    Assistance Recommended at Discharge Frequent or constant Supervision/Assistance  Patient can return home with the following Two people to help with walking and/or transfers;A lot of help with bathing/dressing/bathroom;Assistance with cooking/housework;Assist  for transportation;Help with stairs or ramp for entrance   Equipment Recommendations       Recommendations for Other Services       Precautions / Restrictions Precautions Precautions: Fall Restrictions Weight Bearing Restrictions: No     Mobility  Bed Mobility Overal bed mobility: Needs Assistance Bed Mobility: Supine to Sit, Sit to Supine Rolling: Mod assist   Supine to sit: Mod assist     General bed mobility comments: assist to maximize pain control and ability to participate in upright activity    Transfers Overall transfer level: Needs assistance Equipment used: Rolling walker (2 wheels) Transfers: Sit to/from Stand Sit to Stand: Min assist, From elevated surface           General transfer comment: less assistance needed to rise, EOB still quite high; delayed anterior weightshift righting    Ambulation/Gait   Gait Distance (Feet): 4 Feet (70ft soide stepping x3 with sit breaks between; SOB each time (VSS c HR at 60bpm)) Assistive device: Rolling walker (2 wheels)             Stairs             Wheelchair Mobility    Modified Rankin (Stroke Patients Only)       Balance                                            Cognition Arousal/Alertness: Awake/alert Behavior During Therapy: WFL for tasks assessed/performed Overall Cognitive Status: Within Functional Limits for tasks assessed  Exercises General Exercises - Lower Extremity Short Arc Quad: AROM, Both, 15 reps, Supine Heel Slides: AAROM, Both, 10 reps, Supine, AROM Hip ABduction/ADduction: AROM, Both, 10 reps, Supine Straight Leg Raises: AROM, Both, 5 reps, Supine Other Exercises Other Exercises: cervical retraciton into 2 pillows 1x15 (HOB at 35 degrees) Other Exercises: Hooklying bridge 1x10 (shod) HOB 14 degrees d/t poor flat tolerance Other Exercises: side stepping along bedside 3x96ft (pt offered  countertop but he elects use RW)    General Comments        Pertinent Vitals/Pain Pain Assessment Pain Assessment: Faces Faces Pain Scale: Hurts little more Pain Location: leg pain with assistance in/out bed Pain Intervention(s): Limited activity within patient's tolerance, Monitored during session    Home Living                          Prior Function            PT Goals (current goals can now be found in the care plan section) Acute Rehab PT Goals Patient Stated Goal: get strength back PT Goal Formulation: With patient Time For Goal Achievement: 01/29/23 Potential to Achieve Goals: Fair Progress towards PT goals: Progressing toward goals    Frequency    Min 3X/week      PT Plan Current plan remains appropriate    Co-evaluation              AM-PAC PT "6 Clicks" Mobility   Outcome Measure  Help needed turning from your back to your side while in a flat bed without using bedrails?: A Lot Help needed moving from lying on your back to sitting on the side of a flat bed without using bedrails?: A Lot Help needed moving to and from a bed to a chair (including a wheelchair)?: A Lot Help needed standing up from a chair using your arms (e.g., wheelchair or bedside chair)?: A Lot Help needed to walk in hospital room?: A Lot Help needed climbing 3-5 steps with a railing? : Total 6 Click Score: 11    End of Session Equipment Utilized During Treatment: Gait belt Activity Tolerance: Patient limited by fatigue;Patient tolerated treatment well;Patient limited by pain Patient left: in bed;with call bell/phone within reach;with bed alarm set Nurse Communication: Mobility status;Precautions PT Visit Diagnosis: Muscle weakness (generalized) (M62.81);Difficulty in walking, not elsewhere classified (R26.2);Unsteadiness on feet (R26.81)     Time: 8657-8469 PT Time Calculation (min) (ACUTE ONLY): 39 min  Charges:  $Therapeutic Exercise: 38-52 mins                     9:31 AM, 01/25/23 Rosamaria Lints, PT, DPT Physical Therapist - [facility]  [phone] (ASCOM)    Oswin Johal C 01/25/2023, 9:26 AM

## 2023-01-25 NOTE — TOC Transition Note (Signed)
Transition of Care [facility]) - CM/SW Discharge Note   Patient Details  Name: Gary [name] MRN: [id number] Date of Birth: 1946/02/18  [facility] CM/SW Contact:  Garret Reddish, RN Phone Number: 01/25/2023, 3:17 PM   Clinical Narrative:   Chart reviewed.  Patient is stable for discharge today.  I have spoken with Tiffany, Admission Coordinator at [facility].  She reports that patient has a bed available at [facility].  Patient will be going to room 602.  Patient reports that he will notify his sister of his room number at [facility].    I have sent [facility] patient's SNF transfer report, Discharge Summary, and Discharge orders.    I have arranged EMS via [facility].     I have informed staff nurse of the above information.      Final next level of care: Skilled Nursing Facility Barriers to Discharge: No Barriers Identified   Patient Goals and CMS Choice      Discharge Placement                Patient chooses bed at: [facility] Patient to be transferred to facility by: [facility] Name of family member notified: Patient reports that he will notify sister Patient and family notified of of transfer: 01/25/23  Discharge Plan and Services Additional resources added to the After Visit Summary for     Discharge Planning Services: CM Consult              DME Agency:  (Patient has a rolling walker and a single point cane at home.)                  Social Determinants of Health (SDOH) Interventions SDOH Screenings   Food Insecurity: No Food Insecurity (01/12/2023)  Housing: Low Risk  (01/12/2023)  Transportation Needs: No Transportation Needs (01/12/2023)  Recent Concern: Transportation Needs - Unmet Transportation Needs (01/11/2023)  Utilities: Not At Risk (01/12/2023)  Alcohol Screen: Low Risk  (11/27/2020)  Depression (PHQ2-9): High Risk (01/12/2023)  Financial Resource Strain: Low Risk  (03/10/2022)   Physical Activity: Inactive (03/10/2022)  Social Connections: Socially Isolated (03/10/2022)  Stress: No Stress Concern Present (03/10/2022)  Tobacco Use: High Risk (01/19/2023)     Readmission Risk Interventions    01/15/2023   11:37 AM 08/30/2021   10:48 AM  Readmission Risk Prevention Plan  Transportation Screening Complete Complete  PCP or Specialist Appt within 3-5 Days Complete Complete  HRI or Home Care Consult Complete Complete  Social Work Consult for Recovery Care Planning/Counseling Complete Complete  Palliative Care Screening Not Applicable Not Applicable  Medication Review Ocean[employer]) Complete Complete

## 2023-01-25 NOTE — Care Management Important Message (Signed)
Important Message  Patient Details  Name: Gary [name] MRN: [id number] Date of Birth: 06-14-1946   Medicare Important Message Given:  Yes     Olegario Messier A [name] 01/25/2023, 9:37 AM

## 2023-01-25 NOTE — Discharge Summary (Addendum)
Physician Discharge Summary   Patient: Gary [name] MRN: [id number] DOB: April 16, 1946  Admit date:     01/12/2023  Discharge date: 01/25/23  Discharge Physician: Delfino Lovett   PCP: Dale [place], MD   Recommendations at discharge:   Follow-up with outpatient providers as requested CBC, BMP in 2 days with results to PCP and Onco Dr Orlie Dakin  Discharge Diagnoses: Principal Problem:   Sepsis due to cellulitis Active Problems:   Elevated INR   AKI (acute kidney injury)   Increased anion gap metabolic acidosis   Essential hypertension, benign   Hypothyroid   Adjustment disorder with depressed mood   Hypercholesterolemia   Mantle cell lymphoma   Diabetes   GERD (gastroesophageal reflux disease)   CAD in native artery   Anemia   Pressure injury of skin   Diarrhea  Hospital Course: [name] is a 77 year old male with history of recurrent stage III mantle cell lymphoma who has completed 43 cycles of Rituxan and Treanda, depression, insulin-dependent diabetes mellitus, hypertension, hyperlipidemia, who presents to the emergency department for chief concerns of generalized weakness and hypotension.  Patient is reported to have nausea and vomiting and diarrhea and this has been ongoing for the last several months  Vitals in the ED showed temperature of 98, respiration rate of 16, heart rate of 108, blood pressure 88/60, SpO2 of 99% on room air.  Serum sodium is 134, potassium 3.5, chloride 101, bicarb 19, BUN of 37, serum creatinine of 2.46, EGFR of 27, nonfasting blood glucose 70, WBC 3.3, hemoglobin 11.3, platelets of 92.  High sensitive troponin is 27.  Lactic acid is 2.1.  Blood cultures x 2 are in process.  ED treatment: Ceftriaxone 2 g IV one-time dose, lactated ringer 1 L bolus, vancomycin.  4/3:Labs with slight worsening of metabolic acidosis, creatinine with mild improvement to 2.43, worsening leukopenia and thrombocytopenia but all cell lines decreased-some dilutional  effect.  On imaging which include CT head, cervical spine, CT abdominal and pelvis were negative for any acute abnormality.  INR at 3.5-discontinue DVT prophylaxis with heparin-SCDs ordered. Patient again becoming hypotensive-give him 1 more liter of bolus and also starting midodrine. Still no obvious source of infection as blood cultures remain negative in 24 hours. Will continue broad-spectrum antibiotics.  Concern of lower extremity cellulitis.  4/4: Blood pressure remained on softer side, midodrine dose was increased to 10 mg, 3 times daily.  GI pathogen panel negative, C. difficile negative, Blood cultures remain negative.  Labs with very slowly improving creatinine to 2.33, worsening now anion gap metabolic acidosis with bicarb at 14, mildly elevated T. bili at 1.4.  Lactic acid was normal but found to have elevated beta hydroxybutyric acid, concern of developing DKA. Rechecking BMP if Remained open we will start him on Endo tool. INR still elevated, APTT started improving.  DIC labs negative.  Vitamin K levels pending  Addendum.  Repeat BMP remained high anion gap metabolic acidosis with elevated blood glucose level.  Patient seems like in DKA.  Transferring to stepdown and starting on Endo tool.  4/5: Blood pressure remained little soft.  CBG improved and gap closed.  [name]  He was later managed with resistant sliding scale every 4 hourly.  Potassium was also repleted overnight.  A.m. labs with some improvement of creatinine to 1.99, bicarb still low at 20-infusion was discontinued overnight and he is now being restarted on p.o. bicarb supplement.  TSH found to be elevated at 7.715-Home Synthroid dose was increased to 50. Esophagogram was ordered by GI today.  4/6: Blood pressure remained on softer side.  CBG improved, converted to SSI with meals along with 10 units of Smeglee.  Esophageal gram with limited study  due to patient's inability to position properly but did show patulous esophagus with dysmotility and spontaneous reflex.  Potentially GEJ narrowing.  Will need EGD after improvement in current acute illness.  GI pathogen panel negative.  4/7: Patient slowly improving.  GI planning EGD tomorrow with Dr. [name].  Awaiting SNF placement.  4/8: Hemodynamically stable.  EGD today shows mild narrowing of GEJ, which was dilated with good results.  GI is recommending twice daily PPI and follow-up with primary GI as outpatient. Completed a course of antibiotics for concern of pneumonia.  Will continue on p.o. linezolid for total of 7 days.  4/9:Vitals stable.  Had 1 episode of hypoglycemia with CBG at 66 this morning.  Patient was n.p.o. most of the day yesterday.  Improved p.o. intake and regurgitation today. 4/10 : Nursing reported loose watery diarrhea last evening but upon further discussion with patient he has been having ongoing diarrhea for several months as well as dysphagia and associated weight loss due to poor p.o. intake.  Holding off C. difficile testing for now per discussion with ID. awaiting SNF placement.  Palliative care consult.  Stopping Zyvox 4/11: Reporting chronic diarrhea since start of chemotherapy.  Cutting back on insulin Semglee as blood sugar still running on the lower side 4/12: Reevaluated by GI for chronic diarrhea and also had a CT of abdomen and pelvis which does not show any acute pathology.  Added cholestyramine and probiotic/lactobacillus.  Ordered soft cervical collar per PT recommendation.  Give one-time dose of 80 mg of IV Lasix as he seems fluid overload. SNF on Monday 4/13: No bowel movement reported.  Discontinue telemetry.  Lasix 80 mg IV once 4/14: Reports only 1 bowel movement since yesterday  Assessment and Plan: * Sepsis due to cellulitis Patient has elevated lactic acid of 2.1, leukopenia, elevated heart rate, low normal tensive, with possible source of  infection, cellulitis of the right leg vs CAP Blood cultures remain negative, procalcitonin elevated.  Lactic acidosis resolved. Improving lower extremity cellulitis. Completed the course of ceftriaxone and Zithromax Treated also with Zyvox, discontinued on 4/10  Elevated INR INR elevated at 3.5 on admission which has been improved now.  Patient was not on Coumadin.  Was given heparin subcu for DVT prophylaxis.  Hepatic function with mildly low total protein and albumin and rest normal.  Also found to have significantly elevated APTT which started improving.  DIC labs and peripheral smear negative. Did receive 1 dose of IV vitamin K  Increased anion gap metabolic acidosis Present initially but now resolved with treatment  AKI (acute kidney injury) With CKD 3B -at baseline now.    Essential hypertension, benign Blood pressure soft requiring midodrine  Hypothyroid TSH elevated at 7.7 -Increased the dose of Synthroid to 50 this admission  Adjustment disorder with depressed mood Fluoxetine 20 mg daily   Hypercholesterolemia Continue statin  Mantle cell lymphoma Last chemo was in January 2024.  Being followed up by oncology as outpatient, patient reports chronic diarrhea since start of chemotherapy.   He also has pancytopenia which i could be due to his lymphoma/chemo Platelets low/44 but stable.  Outpatient follow-up with Dr. Orlie Dakin  Diarrhea Unknown Etiology.  Possibly functional versus bile malabsorption versus chemo  related.  Resolved with Imodium, Florastor and cholestyramine -Patient reported only 1 bowel movement in last 2-3 days Repeated CT abdomen and pelvis on 4/12 which did not show any other acute pathology.  GERD (gastroesophageal reflux disease) Patient is having a lot of GERD issues and spitting out most of the medications. -GI was consulted for abnormal esophageal gram.  EGD with benign esophageal stenosis and narrowing of GEJ junction which was dilated -Continue  PPI twice daily -Follow-up with primary GI as outpatient-patient reports ongoing dysphagia.  Poor p.o. intake and chronic diarrhea with associated weight loss.  GI recommends outpatient follow-up  Diabetes Continue home regimen         Consultants: Palliative care, GI Procedures performed: EGD on 4/8 Disposition: Skilled nursing facility/palliative care to follow Diet recommendation:  Carb modified diet DISCHARGE MEDICATION: Allergies as of 01/25/2023       Reactions   Rofecoxib Nausea Only        Medication List     STOP taking these medications    potassium chloride 10 MEQ tablet Commonly known as: KLOR-CON   prochlorperazine 10 MG tablet Commonly known as: COMPAZINE       TAKE these medications    cholestyramine 4 g packet Commonly known as: QUESTRAN Take 1 packet (4 g total) by mouth 2 (two) times daily.   cyanocobalamin 1000 MCG tablet Commonly known as: VITAMIN B12 Take 1,000 mcg by mouth daily.   feeding supplement (GLUCERNA SHAKE) Liqd Take 237 mLs by mouth 2 (two) times daily between meals.   ferrous sulfate 325 (65 FE) MG tablet Take 325 mg by mouth daily with breakfast.   FLUoxetine 20 MG tablet Commonly known as: PROZAC Take 1 tablet (20 mg total) by mouth daily.   furosemide 40 MG tablet Commonly known as: LASIX Take 2 tablets in the morning, and 1 tablet in the evening.   glucose 4 GM chewable tablet Chew 1 tablet (4 g total) by mouth once as needed for low blood sugar.   insulin aspart 100 UNIT/ML injection Commonly known as: novoLOG Inject 6 Units into the skin 3 (three) times daily with meals.   insulin pump Soln Inject into the skin.   lactobacillus Pack Take 1 packet (1 g total) by mouth 3 (three) times daily with meals.   levothyroxine 50 MCG tablet Commonly known as: SYNTHROID Take 1 tablet (50 mcg total) by mouth daily before breakfast. Start taking on: January 26, 2023 What changed:  medication strength how much to  take   lidocaine-prilocaine cream Commonly known as: EMLA Apply 1 Application topically once.   loperamide 2 MG capsule Commonly known as: IMODIUM Take 1 capsule (2 mg total) by mouth as needed for diarrhea or loose stools.   midodrine 10 MG tablet Commonly known as: PROAMATINE Take 1 tablet (10 mg total) by mouth 3 (three) times daily with meals.   nystatin cream Commonly known as: MYCOSTATIN APPLY TO AFFECTED AREA TWICE A DAY   pantoprazole 40 MG tablet Commonly known as: PROTONIX Take 1 tablet (40 mg total) by mouth 2 (two) times daily before a meal.   potassium chloride SA 20 MEQ tablet Commonly known as: KLOR-CON M Take 1 tablet (20 mEq total) by mouth 2 (two) times daily.   pravastatin 40 MG tablet Commonly known as: PRAVACHOL TAKE 1 TABLET BY MOUTH EVERY DAY   vitamin D3 50 MCG (2000 UT) Caps Take 2,000 Units by mouth daily.  Discharge Care Instructions  (From admission, onward)           Start     Ordered   01/25/23 0000  Discharge wound care:       Comments: As above   01/25/23 0731            Contact information for after-discharge care     Destination     [facility] Preferred SNF .   Service: Skilled Nursing Contact information: [address] [place] [address] [phone]                    Discharge Exam: Ceasar Mons Weights   01/12/23 1455 01/14/23 1745 01/20/23 0537  Weight: 93 kg 99.2 kg 102.1 kg   General.  Frail elderly man, in no acute distress.   Pulmonary.  Lungs clear bilaterally, normal respiratory effort. CV.  Regular rate and rhythm, no JVD, rub or murmur. Abdomen.  Soft, nontender, nondistended, BS positive. CNS.  Alert and oriented .  No focal neurologic deficit. Extremities.  Bilateral LE Ace wrap, no erythema, trace lower extremity edema Psychiatry.  Judgment and insight appears normal.    Condition at discharge: fair  The results of significant diagnostics from this hospitalization (including imaging, microbiology, ancillary and laboratory) are listed below for reference.   Imaging Studies: CT ABDOMEN PELVIS W CONTRAST  Result Date: 01/22/2023 CLINICAL DATA:  Chronic diarrhea. Recurrent stage III mantle cell lymphoma. EXAM: CT ABDOMEN AND PELVIS WITH CONTRAST TECHNIQUE: Multidetector CT imaging of the abdomen and pelvis was performed using the standard protocol following bolus administration of intravenous contrast. RADIATION DOSE REDUCTION: This exam was performed according to the departmental dose-optimization program which includes automated exposure control, adjustment of the mA and/or kV according to patient size and/or use of iterative reconstruction technique. CONTRAST:  OMNIPAQUE IOHEXOL 300 MG/ML  SOLN COMPARISON:  CT of the abdomen and pelvis without contrast 01/12/2023. CT scan 12/10/2022 FINDINGS: Lower chest: Moderate bilateral pleural effusions are present. Associated dependent airspace opacities are present. Pacing wires noted. Heart size is within normal limits. No significant pericardial effusion is present. Hepatobiliary: No focal liver abnormality is seen. Status post cholecystectomy. No biliary dilatation. Pancreas: Pancreas is somewhat atrophic. No focal lesions or inflammation are present. Spleen: Normal in size without focal abnormality. Adrenals/Urinary Tract: Adrenal glands are normal bilaterally. Right nephrectomy noted. The left kidney demonstrates a stable 12 mm simple cyst near the lower pole. No other focal lesions are present. No stone or obstruction is present. The left ureter is within normal limits. The urinary bladder is normal. Stomach/Bowel: The stomach and duodenum are within normal limits. The small bowel is unremarkable. Terminal ileum is within normal limits. Appendix is not discretely visualized and may be surgically absent. The ascending and  transverse colon are within normal limits. The descending and sigmoid colon normal. Vascular/Lymphatic: Atherosclerotic calcifications are again noted within the aorta and branch vessels. No aneurysm is present. Reproductive: Prostate is unremarkable. The seminal vesicles are within normal limits bilaterally. Other: A paraumbilical hernia contains fat without bowel. Fat also herniates into the left inguinal canal without associated bowel. Musculoskeletal: Degenerative changes are again noted in the lumbar spine. Right SI joint is fused. No focal osseous lesions are present. IMPRESSION: 1. No acute or focal lesion to explain the patient's symptoms. 2. Moderate bilateral pleural effusions with associated dependent airspace opacities. 3. Right nephrectomy. 4. Stable 12 mm simple cyst near the  lower pole of the left kidney. 5. Periumbilical and left inguinal hernias contain fat without bowel. 6.  Aortic Atherosclerosis (ICD10-I70.0). Electronically Signed   By: Marin Roberts M.D.   On: 01/22/2023 11:12   DG ESOPHAGUS W SINGLE CM (SOL OR THIN BA)  Result Date: 01/15/2023 CLINICAL DATA:  Patient admitted for sepsis due to cellulitis and AKI with history of stage III mantle cell lymphoma on chemotherapy. Patient complains of dysphagia with associated nausea and vomiting over the last several months. Patient was referred for fluoroscopic esophagram study. EXAM: ESOPHAGUS/BARIUM SWALLOW/TABLET STUDY TECHNIQUE: Single contrast examination was performed using thin liquid barium. This exam was performed by Alex Gardener, NP, and was supervised and interpreted by Lesia Hausen, MD. FLUOROSCOPY: Radiation Exposure Index (as provided by the fluoroscopic device): 62.20 mGy Kerma COMPARISON:  None Available. FINDINGS: Limited study due to patient's inability to stand or position on fluoroscopy table. Swallowing: Appears normal. No vestibular penetration or aspiration seen. Pharynx: Unremarkable. Esophagus: Esophagus is  patulous in appearance with narrowing at the GE junction. No mucosal lesion is identified. Esophageal motility: Esophageal dysmotility noted with absent peristaltic stripping wave resulting in stasis with to and fro motion of barium on swallows. Hiatal Hernia: None. Gastroesophageal reflux: Moderate volume gastroesophageal reflux visualized with belching. Ingested 13mm barium tablet: 13 mm barium tablet not given Other: None. IMPRESSION: 1. Limited study due to the patient's inability to stand or position on fluoroscopy table. 2. Patulous esophagus with dysmotility and spontaneous reflux. Electronically Signed   By: Lesia Hausen M.D.   On: 01/15/2023 15:45   DG Chest 2 View  Result Date: 01/12/2023 CLINICAL DATA:  Weakness EXAM: CHEST - 2 VIEW COMPARISON:  CXR 11/13/22 FINDINGS: Left-sided dual lead cardiac device in place with unchanged lead positioning. Unchanged cardiac and mediastinal contours. Right-sided chest port with tip at the cavoatrial junction, unchanged from prior exam. No pleural effusion. No pneumothorax. Bibasilar atelectasis. No radiographically apparent displaced rib fractures. Vertebral body heights are maintained. Visualized upper abdomen is unremarkable. IMPRESSION: Bibasilar atelectasis.  No new focal airspace opacity. Electronically Signed   By: Lorenza Cambridge M.D.   On: 01/12/2023 16:36   CT ABDOMEN PELVIS WO CONTRAST  Result Date: 01/12/2023 CLINICAL DATA:  Abdominal pain EXAM: CT ABDOMEN AND PELVIS WITHOUT CONTRAST TECHNIQUE: Multidetector CT imaging of the abdomen and pelvis was performed following the standard protocol without IV contrast. RADIATION DOSE REDUCTION: This exam was performed according to the departmental dose-optimization program which includes automated exposure control, adjustment of the mA and/or kV according to patient size and/or use of iterative reconstruction technique. COMPARISON:  08/07/2022 FINDINGS: Lower chest: Linear bibasilar subsegmental atelectasis or  scarring. Enlarged heart and pacer wires noted. Small hiatal hernia Hepatobiliary: No focal liver abnormality is seen. Status post cholecystectomy. No biliary dilatation. Pancreas: Unremarkable. No pancreatic ductal dilatation or surrounding inflammatory changes. Spleen: Normal in size without focal abnormality. Adrenals/Urinary Tract: Right kidney is absent. No adrenal lesions. No nephrolithiasis or hydronephrosis. Unremarkable urinary bladder Stomach/Bowel: Stomach is within normal limits. Appendix appears normal. No evidence of bowel wall thickening, distention, or inflammatory changes. Vascular/Lymphatic: Atheromatous changes. Shotty small retroperitoneal nodes are identified. The enlarged retroperitoneal, pelvic, and iliac nodes observed previously have nearly resolved. One left para-aortic node that previously measured 1.7 cm now measures 1.0 cm for example. Reproductive: Prostate is unremarkable. Other: No abdominal wall hernia or abnormality. No abdominopelvic ascites. Musculoskeletal: Thoracolumbar degenerative changes. No acute osseous abnormalities. IMPRESSION: 1. Retroperitoneal adenopathy present on prior study has nearly resolved. 2. No  acute abdominal or pelvic pathology identified. Electronically Signed   By: Layla Maw M.D.   On: 01/12/2023 16:31   CT HEAD WO CONTRAST ( )  Result Date: 01/12/2023 CLINICAL DATA:  Trauma EXAM: CT HEAD WITHOUT CONTRAST CT CERVICAL SPINE WITHOUT CONTRAST TECHNIQUE: Multidetector CT imaging of the head and cervical spine was performed following the standard protocol without intravenous contrast. Multiplanar CT image reconstructions of the cervical spine were also generated. RADIATION DOSE REDUCTION: This exam was performed according to the departmental dose-optimization program which includes automated exposure control, adjustment of the mA and/or kV according to patient size and/or use of iterative reconstruction technique. COMPARISON:  CT Head 08/29/21  FINDINGS: CT HEAD FINDINGS Brain: No evidence of acute infarction, hemorrhage, hydrocephalus, extra-axial collection or mass lesion/mass effect. 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 Sequela of moderate chronic microvascular ischemic change. Generalized volume loss. Vascular: No hyperdense vessel or unexpected calcification. Skull: Normal. Negative for fracture or focal lesion. Sinuses/Orbits: No middle ear or mastoid effusion. Paranasal sinuses are clear. Bilateral lens replacement. Orbits are otherwise unremarkable. Other: None. CT CERVICAL SPINE FINDINGS Alignment: Trace anterolisthesis of C3 on C4 and C4 on C5 Skull base and vertebrae: No acute fracture. No primary bone lesion or focal pathologic process. Unchanged appearance of the superior endplate of C7, likely degenerative. Soft tissues and spinal canal: No prevertebral fluid or swelling. No visible canal hematoma. Disc levels:  No evidence of high-grade spinal canal stenosis. Upper chest: Negative. Other: None IMPRESSION: 1. No acute intracranial abnormality. 2. No acute cervical spine fracture or traumatic malalignment. Electronically Signed   By: Lorenza Cambridge M.D.   On: 01/12/2023 16:21   CT Cervical Spine Wo Contrast  Result Date: 01/12/2023 CLINICAL DATA:  Trauma EXAM: CT HEAD WITHOUT CONTRAST CT CERVICAL SPINE WITHOUT CONTRAST TECHNIQUE: Multidetector CT imaging of the head and cervical spine was performed following the standard protocol without intravenous contrast. Multiplanar CT image reconstructions of the cervical spine were also generated. RADIATION DOSE REDUCTION: This exam was performed according to the departmental dose-optimization program which includes automated exposure control, adjustment of the mA and/or kV according to patient size and/or use of iterative reconstruction technique. COMPARISON:  CT Head 08/29/21 FINDINGS: CT HEAD FINDINGS Brain: No evidence of acute infarction, hemorrhage, hydrocephalus, extra-axial  collection or mass lesion/mass effect. 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 Sequela of moderate chronic microvascular ischemic change. Generalized volume loss. Vascular: No hyperdense vessel or unexpected calcification. Skull: Normal. Negative for fracture or focal lesion. Sinuses/Orbits: No middle ear or mastoid effusion. Paranasal sinuses are clear. Bilateral lens replacement. Orbits are otherwise unremarkable. Other: None. CT CERVICAL SPINE FINDINGS Alignment: Trace anterolisthesis of C3 on C4 and C4 on C5 Skull base and vertebrae: No acute fracture. No primary bone lesion or focal pathologic process. Unchanged appearance of the superior endplate of C7, likely degenerative. Soft tissues and spinal canal: No prevertebral fluid or swelling. No visible canal hematoma. Disc levels:  No evidence of high-grade spinal canal stenosis. Upper chest: Negative. Other: None IMPRESSION: 1. No acute intracranial abnormality. 2. No acute cervical spine fracture or traumatic malalignment. Electronically Signed   By: Lorenza Cambridge M.D.   On: 01/12/2023 16:21    Microbiology: Results for orders placed or performed during the hospital encounter of 01/12/23  Blood culture (routine x 2)     Status: None   Collection Time: 01/12/23  3:44 PM   Specimen: BLOOD  Result Value Ref Range Status   Specimen Description BLOOD BLOOD RIGHT ARM  Final   Special Requests   Final    BOTTLES DRAWN AEROBIC AND ANAEROBIC Blood Culture adequate volume   Culture   Final    NO GROWTH 5 DAYS Performed at [facility], [address] [place], [place] [address]    Report Status 01/17/2023 FINAL  Final  Blood culture (routine x 2)     Status: None   Collection Time: 01/12/23  3:45 PM   Specimen: BLOOD  Result Value Ref Range Status   Specimen Description BLOOD BLOOD LEFT ARM  Final   Special Requests   Final    BOTTLES DRAWN AEROBIC AND ANAEROBIC Blood Culture adequate volume   Culture   Final    NO GROWTH 5  DAYS Performed at [facility], [address], [place], [place] [address]    Report Status 01/17/2023 FINAL  Final  MRSA Next Gen by PCR, Nasal     Status: None   Collection Time: 01/13/23  9:54 AM   Specimen: Nasal Mucosa; Nasal Swab  Result Value Ref Range Status   MRSA by PCR Next Gen NOT DETECTED NOT DETECTED Final    Comment: (NOTE) The GeneXpert MRSA Assay (FDA approved for NASAL specimens only), is one component of a comprehensive MRSA colonization surveillance program. It is not intended to diagnose MRSA infection nor to guide or monitor treatment for MRSA infections. Test performance is not FDA approved in patients less than 46 years old. Performed at [facility], [address] [place], [place] [address]   Gastrointestinal Panel by PCR , Stool     Status: None   Collection Time: 01/15/23  6:50 AM   Specimen: Stool  Result Value Ref Range Status   Campylobacter species NOT DETECTED NOT DETECTED Final   Plesimonas shigelloides NOT DETECTED NOT DETECTED Final   Salmonella species NOT DETECTED NOT DETECTED Final   Yersinia enterocolitica NOT DETECTED NOT DETECTED Final   Vibrio species NOT DETECTED NOT DETECTED Final   Vibrio cholerae NOT DETECTED NOT DETECTED Final   Enteroaggregative E coli (EAEC) NOT DETECTED NOT DETECTED Final   Enteropathogenic E coli (EPEC) NOT DETECTED NOT DETECTED Final   Enterotoxigenic E coli (ETEC) NOT DETECTED NOT DETECTED Final   Shiga like toxin producing E coli (STEC) NOT DETECTED NOT DETECTED Final   Shigella/Enteroinvasive E coli (EIEC) NOT DETECTED NOT DETECTED Final   Cryptosporidium NOT DETECTED NOT DETECTED Final   Cyclospora cayetanensis NOT DETECTED NOT DETECTED Final   Entamoeba histolytica NOT DETECTED NOT DETECTED Final   Giardia lamblia NOT DETECTED NOT DETECTED Final   Adenovirus F40/41 NOT DETECTED NOT DETECTED Final   Astrovirus NOT DETECTED NOT DETECTED Final   Norovirus GI/GII NOT DETECTED NOT  DETECTED Final   Rotavirus A NOT DETECTED NOT DETECTED Final   Sapovirus (I, II, IV, and V) NOT DETECTED NOT DETECTED Final    Comment: Performed at [facility], [address] [place], [place] [address]   *Note: Due to a large number of results and/or encounters for the requested time period, some results have not been displayed. A complete set of results can be found in Results Review.    Labs: CBC: Recent Labs  Lab 01/18/23 0935 01/20/23 0645 01/21/23 0445 01/23/23 0600 01/25/23 0441  WBC 2.8* 3.0* 3.1* 2.6* 2.2*  HGB 10.7* 10.5* 10.2* 10.0* 9.1*  HCT 33.5* 33.0* 32.4* 31.3* 28.1*  MCV 92.8 94.3 94.2 94.0 93.0  PLT 76* 60* 59* 40* 44*   Basic Metabolic Panel: Recent Labs  Lab 01/18/23 0935 01/20/23 0645 01/21/23 0445 01/23/23 0600 01/25/23 0441  NA 138 141 140 136 138  K 4.3 4.2 4.1 4.0 3.8  CL 106 108 106 99 100  CO2 GLUCOSE 190* 108* 101* 177* 123*  BUN CREATININE 1.03 0.96 0.94 1.04 1.03  CALCIUM 7.5* 7.6* 7.4* 7.5* 7.5*   Liver Function Tests: No results for input(s): "AST", "ALT", "ALKPHOS", "BILITOT", "PROT", "ALBUMIN" in the last 168 hours. CBG: Recent Labs  Lab 01/23/23 2128 01/24/23 0750 01/24/23 1151 01/24/23 1639 01/24/23 2020  GLUCAP 152* 236* 210* 201* 162*    Discharge time spent: greater than 30 minutes.  Signed: Delfino Lovett, MD [facility] 01/25/2023

## 2023-01-26 DIAGNOSIS — R1311 Dysphagia, oral phase: Secondary | ICD-10-CM | POA: Insufficient documentation

## 2023-01-27 DIAGNOSIS — F320 Major depressive disorder, single episode, mild: Secondary | ICD-10-CM | POA: Diagnosis not present

## 2023-01-27 DIAGNOSIS — E7800 Pure hypercholesterolemia, unspecified: Secondary | ICD-10-CM | POA: Diagnosis not present

## 2023-01-27 DIAGNOSIS — R112 Nausea with vomiting, unspecified: Secondary | ICD-10-CM | POA: Diagnosis not present

## 2023-01-27 DIAGNOSIS — E119 Type 2 diabetes mellitus without complications: Secondary | ICD-10-CM | POA: Diagnosis not present

## 2023-01-27 DIAGNOSIS — K591 Functional diarrhea: Secondary | ICD-10-CM | POA: Diagnosis not present

## 2023-01-27 DIAGNOSIS — D696 Thrombocytopenia, unspecified: Secondary | ICD-10-CM | POA: Diagnosis not present

## 2023-01-27 DIAGNOSIS — D510 Vitamin B12 deficiency anemia due to intrinsic factor deficiency: Secondary | ICD-10-CM | POA: Diagnosis not present

## 2023-01-27 DIAGNOSIS — N1832 Chronic kidney disease, stage 3b: Secondary | ICD-10-CM | POA: Diagnosis not present

## 2023-01-27 DIAGNOSIS — C8310 Mantle cell lymphoma, unspecified site: Secondary | ICD-10-CM | POA: Diagnosis not present

## 2023-01-27 DIAGNOSIS — I9589 Other hypotension: Secondary | ICD-10-CM | POA: Diagnosis not present

## 2023-01-27 DIAGNOSIS — I10 Essential (primary) hypertension: Secondary | ICD-10-CM | POA: Diagnosis not present

## 2023-01-27 DIAGNOSIS — E039 Hypothyroidism, unspecified: Secondary | ICD-10-CM | POA: Diagnosis not present

## 2023-01-28 ENCOUNTER — Other Ambulatory Visit: Payer: Self-pay

## 2023-01-28 ENCOUNTER — Ambulatory Visit (INDEPENDENT_AMBULATORY_CARE_PROVIDER_SITE_OTHER): Payer: Medicare Other | Admitting: Gastroenterology

## 2023-01-28 ENCOUNTER — Encounter: Payer: Self-pay | Admitting: Gastroenterology

## 2023-01-28 VITALS — BP 98/55 | HR 77 | Temp 98.7°F | Ht 69.0 in | Wt 213.0 lb

## 2023-01-28 DIAGNOSIS — R197 Diarrhea, unspecified: Secondary | ICD-10-CM

## 2023-01-28 DIAGNOSIS — R791 Abnormal coagulation profile: Secondary | ICD-10-CM | POA: Diagnosis not present

## 2023-01-28 MED ORDER — NA SULFATE-K SULFATE-MG SULF 17.5-3.13-1.6 GM/177ML PO SOLN: 354.0000 mL | Freq: Once | ORAL | 0 refills | Status: AC

## 2023-01-28 NOTE — Progress Notes (Signed)
[name] MD, MRCP(U.K) [address]  [address]  [place], [place] [address]  Main: [phone]  Fax: [phone]   Gastroenterology Consultation  Referring Provider:     Dale [place], MD Primary Care Physician:  Dale Monserrate, MD Primary Gastroenterologist:  Dr. [name]  Reason for Consultation: Mantle cell lymphoma        HPI:   Gary [name] is a 77 y.o. y/o male who is a patient of Dr. Orlie Dakin and being treated for stage III recurrent mantle cell lymphoma.  Been having diarrhea since January 5 to 6 stools per day being referred to GI for diarrhea.  Was admitted to the hospital on 01/14/2023 was seen by Dr. [name] for dysphagia, diarrhea and vomiting.  Mild narrowing of the GI GE junction dilated to 48 [place].  01/25/2023 hemoglobin 9.1 g platelet count of 44 01/15/2023 stool GI PCR not detected 01/14/2023 INR 3.5 01/18/2023 INR 1.4  He states continues to have multiple watery bowel movements a day.  Consumes some artificial sugars in his drinks such as sweet and low and also takes multiple aspirins a day for pain.  No other artificial sugars or weakness.  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    Gastroparesis    Graves disease    Hypercholesterolemia    Hypertension    Mantle cell lymphoma 07/2014   Peripheral neuropathy    Shingles     Past Surgical History:  Procedure Laterality Date   CHOLECYSTECTOMY     ESOPHAGOGASTRODUODENOSCOPY (EGD) WITH PROPOFOL N/A 01/18/2023   Procedure: ESOPHAGOGASTRODUODENOSCOPY (EGD) WITH PROPOFOL;  Surgeon: Jaynie Collins, DO;  Location: [facility] ENDOSCOPY;  Service: Gastroenterology;  Laterality: N/A;   NEPHRECTOMY     right   PACEMAKER INSERTION N/A 12/07/2017   Procedure: INSERTION PACEMAKER DUAL  CHAMBER INITIAL INSERT;  Surgeon: Marcina Millard, MD;  Location: [facility] ORS;  Service: Cardiovascular;  Laterality: N/A;   PORTA CATH INSERTION N/A 11/03/2017   Procedure: PORTA CATH INSERTION;  Surgeon: Renford Dills, MD;  Location: [facility] INVASIVE CV LAB;  Service: Cardiovascular;  Laterality: N/A;    Prior to Admission medications   Medication Sig Start Date End Date Taking? Authorizing Provider  Cholecalciferol (VITAMIN D3) 50 MCG (2000 UT) CAPS Take 2,000 Units by mouth daily. 03/20/20   [provider]  cholestyramine (QUESTRAN) 4 g packet Take 1 packet (4 g total) by mouth 2 (two) times daily. 01/25/23   Delfino Lovett, MD  feeding supplement, GLUCERNA SHAKE, (GLUCERNA SHAKE) LIQD Take 237 mLs by mouth 2 (two) times daily between meals. 09/13/21   Swayze, Ava, DO  ferrous sulfate 325 (65 FE) MG tablet Take 325 mg by mouth daily with breakfast.    [provider]  FLUoxetine (PROZAC) 20 MG tablet Take 1 tablet (20 mg total) by mouth daily. 11/13/22   Dale [place], MD  furosemide (LASIX) 40 MG tablet Take 2 tablets in the morning, and 1 tablet in the evening. 08/19/22   Dale Standard, MD  glucose 4 GM chewable tablet Chew 1 tablet (4 g total) by mouth once as needed for low blood sugar. 02/09/21   Dale [place], MD  insulin aspart (NOVOLOG) 100 UNIT/ML injection Inject 6 Units into the skin 3 (three) times  daily with meals. Patient not taking: Reported on 01/12/2023 09/03/21   Darlin Priestly, MD  Insulin Human (INSULIN PUMP) SOLN Inject into the skin.    [provider]  lactobacillus (FLORANEX/LACTINEX) PACK Take 1 packet (1 g total) by mouth 3 (three) times daily with meals. 01/25/23   Delfino Lovett, MD  levothyroxine (SYNTHROID) 50 MCG tablet Take 1 tablet (50 mcg total) by mouth daily before breakfast. 01/26/23   Delfino Lovett, MD  lidocaine-prilocaine (EMLA) cream Apply 1 Application topically once. 08/26/22   [provider]  loperamide (IMODIUM) 2 MG capsule  Take 1 capsule (2 mg total) by mouth as needed for diarrhea or loose stools. 01/25/23   Delfino Lovett, MD  midodrine (PROAMATINE) 10 MG tablet Take 1 tablet (10 mg total) by mouth 3 (three) times daily with meals. 01/25/23   Delfino Lovett, MD  nystatin cream (MYCOSTATIN) APPLY TO AFFECTED AREA TWICE A DAY 11/30/22   Dale Laverne, MD  pantoprazole (PROTONIX) 40 MG tablet Take 1 tablet (40 mg total) by mouth 2 (two) times daily before a meal. 01/25/23   Delfino Lovett, MD  potassium chloride SA (KLOR-CON M) 20 MEQ tablet Take 1 tablet (20 mEq total) by mouth 2 (two) times daily. 01/08/23   Borders, Daryl Eastern, NP  pravastatin (PRAVACHOL) 40 MG tablet TAKE 1 TABLET BY MOUTH EVERY DAY 09/14/22   Dale Avery, MD  vitamin B-12 (CYANOCOBALAMIN) 1000 MCG tablet Take 1,000 mcg by mouth daily.    [provider]    Family History  Problem Relation Age of Onset   Breast cancer Mother    Diabetes Father    Cancer Father        Lymphoma   Alcohol abuse Maternal Uncle    Diabetes Sister    Heart disease Other        grandfather     Social History   Tobacco Use   Smoking status: Former    Types: Cigarettes    Passive exposure: Past   Smokeless tobacco: Current    Types: Chew  Vaping Use   Vaping Use: Never used  Substance Use Topics   Alcohol use: Yes    Alcohol/week: 3.0 standard drinks of alcohol    Types: 3 Cans of beer per week    Comment: nightly   Drug use: Never    Allergies as of 01/28/2023 - Review Complete 01/18/2023  Allergen Reaction Noted   Rofecoxib Nausea Only 04/09/2015    Review of Systems:    All systems reviewed and negative except where noted in HPI.   Physical Exam:  BP (!) 98/55   Pulse 77   Temp 98.7 F (37.1 C)   Ht 5\' 9"  (1.753 m)   Wt 213 lb (96.6 kg)   BMI 31.45 kg/m  No LMP for male patient. Psych:  Alert and cooperative. Normal mood and affect. General:   Alert,  Well-developed, well-nourished, pleasant and cooperative in NAD Head:   Normocephalic and atraumatic. Eyes:  Sclera clear, no icterus.   Conjunctiva pink. Ears:  Normal auditory acuity.  Neurologic:  Alert and oriented x3;  grossly normal neurologically. Psych:  Alert and cooperative. Normal mood and affect.  Imaging Studies: CT ABDOMEN PELVIS W CONTRAST  Result Date: 01/22/2023 CLINICAL DATA:  Chronic diarrhea. Recurrent stage III mantle cell lymphoma. EXAM: CT ABDOMEN AND PELVIS WITH CONTRAST TECHNIQUE: Multidetector CT imaging of the abdomen and pelvis was performed using the standard protocol following bolus administration of intravenous contrast. RADIATION DOSE REDUCTION: This  exam was performed according to the departmental dose-optimization program which includes automated exposure control, adjustment of the mA and/or kV according to patient size and/or use of iterative reconstruction technique. CONTRAST:  OMNIPAQUE IOHEXOL 300 MG/ML  SOLN COMPARISON:  CT of the abdomen and pelvis without contrast 01/12/2023. CT scan 12/10/2022 FINDINGS: Lower chest: Moderate bilateral pleural effusions are present. Associated dependent airspace opacities are present. Pacing wires noted. Heart size is within normal limits. No significant pericardial effusion is present. Hepatobiliary: No focal liver abnormality is seen. Status post cholecystectomy. No biliary dilatation. Pancreas: Pancreas is somewhat atrophic. No focal lesions or inflammation are present. Spleen: Normal in size without focal abnormality. Adrenals/Urinary Tract: Adrenal glands are normal bilaterally. Right nephrectomy noted. The left kidney demonstrates a stable 12 mm simple cyst near the lower pole. No other focal lesions are present. No stone or obstruction is present. The left ureter is within normal limits. The urinary bladder is normal. Stomach/Bowel: The stomach and duodenum are within normal limits. The small bowel is unremarkable. Terminal ileum is within normal limits. Appendix is not discretely visualized  and may be surgically absent. The ascending and transverse colon are within normal limits. The descending and sigmoid colon normal. Vascular/Lymphatic: Atherosclerotic calcifications are again noted within the aorta and branch vessels. No aneurysm is present. Reproductive: Prostate is unremarkable. The seminal vesicles are within normal limits bilaterally. Other: A paraumbilical hernia contains fat without bowel. Fat also herniates into the left inguinal canal without associated bowel. Musculoskeletal: Degenerative changes are again noted in the lumbar spine. Right SI joint is fused. No focal osseous lesions are present. IMPRESSION: 1. No acute or focal lesion to explain the patient's symptoms. 2. Moderate bilateral pleural effusions with associated dependent airspace opacities. 3. Right nephrectomy. 4. Stable 12 mm simple cyst near the lower pole of the left kidney. 5. Periumbilical and left inguinal hernias contain fat without bowel. 6.  Aortic Atherosclerosis (ICD10-I70.0). Electronically Signed   By: Marin Roberts M.D.   On: 01/22/2023 11:12   DG ESOPHAGUS W SINGLE CM (SOL OR THIN BA)  Result Date: 01/15/2023 CLINICAL DATA:  Patient admitted for sepsis due to cellulitis and AKI with history of stage III mantle cell lymphoma on chemotherapy. Patient complains of dysphagia with associated nausea and vomiting over the last several months. Patient was referred for fluoroscopic esophagram study. EXAM: ESOPHAGUS/BARIUM SWALLOW/TABLET STUDY TECHNIQUE: Single contrast examination was performed using thin liquid barium. This exam was performed by Alex Gardener, NP, and was supervised and interpreted by Lesia Hausen, MD. FLUOROSCOPY: Radiation Exposure Index (as provided by the fluoroscopic device): 62.20 mGy Kerma COMPARISON:  None Available. FINDINGS: Limited study due to patient's inability to stand or position on fluoroscopy table. Swallowing: Appears normal. No vestibular penetration or aspiration seen.  Pharynx: Unremarkable. Esophagus: Esophagus is patulous in appearance with narrowing at the GE junction. No mucosal lesion is identified. Esophageal motility: Esophageal dysmotility noted with absent peristaltic stripping wave resulting in stasis with to and fro motion of barium on swallows. Hiatal Hernia: None. Gastroesophageal reflux: Moderate volume gastroesophageal reflux visualized with belching. Ingested 13mm barium tablet: 13 mm barium tablet not given Other: None. IMPRESSION: 1. Limited study due to the patient's inability to stand or position on fluoroscopy table. 2. Patulous esophagus with dysmotility and spontaneous reflux. Electronically Signed   By: Lesia Hausen M.D.   On: 01/15/2023 15:45   DG Chest 2 View  Result Date: 01/12/2023 CLINICAL DATA:  Weakness EXAM: CHEST - 2 VIEW COMPARISON:  CXR  11/13/22 FINDINGS: Left-sided dual lead cardiac device in place with unchanged lead positioning. Unchanged cardiac and mediastinal contours. Right-sided chest port with tip at the cavoatrial junction, unchanged from prior exam. No pleural effusion. No pneumothorax. Bibasilar atelectasis. No radiographically apparent displaced rib fractures. Vertebral body heights are maintained. Visualized upper abdomen is unremarkable. IMPRESSION: Bibasilar atelectasis.  No new focal airspace opacity. Electronically Signed   By: Lorenza Cambridge M.D.   On: 01/12/2023 16:36   CT ABDOMEN PELVIS WO CONTRAST  Result Date: 01/12/2023 CLINICAL DATA:  Abdominal pain EXAM: CT ABDOMEN AND PELVIS WITHOUT CONTRAST TECHNIQUE: Multidetector CT imaging of the abdomen and pelvis was performed following the standard protocol without IV contrast. RADIATION DOSE REDUCTION: This exam was performed according to the departmental dose-optimization program which includes automated exposure control, adjustment of the mA and/or kV according to patient size and/or use of iterative reconstruction technique. COMPARISON:  08/07/2022 FINDINGS: Lower chest:  Linear bibasilar subsegmental atelectasis or scarring. Enlarged heart and pacer wires noted. Small hiatal hernia Hepatobiliary: No focal liver abnormality is seen. Status post cholecystectomy. No biliary dilatation. Pancreas: Unremarkable. No pancreatic ductal dilatation or surrounding inflammatory changes. Spleen: Normal in size without focal abnormality. Adrenals/Urinary Tract: Right kidney is absent. No adrenal lesions. No nephrolithiasis or hydronephrosis. Unremarkable urinary bladder Stomach/Bowel: Stomach is within normal limits. Appendix appears normal. No evidence of bowel wall thickening, distention, or inflammatory changes. Vascular/Lymphatic: Atheromatous changes. Shotty small retroperitoneal nodes are identified. The enlarged retroperitoneal, pelvic, and iliac nodes observed previously have nearly resolved. One left para-aortic node that previously measured 1.7 cm now measures 1.0 cm for example. Reproductive: Prostate is unremarkable. Other: No abdominal wall hernia or abnormality. No abdominopelvic ascites. Musculoskeletal: Thoracolumbar degenerative changes. No acute osseous abnormalities. IMPRESSION: 1. Retroperitoneal adenopathy present on prior study has nearly resolved. 2. No acute abdominal or pelvic pathology identified. Electronically Signed   By: Layla Maw M.D.   On: 01/12/2023 16:31   CT HEAD WO CONTRAST ( )  Result Date: 01/12/2023 CLINICAL DATA:  Trauma EXAM: CT HEAD WITHOUT CONTRAST CT CERVICAL SPINE WITHOUT CONTRAST TECHNIQUE: Multidetector CT imaging of the head and cervical spine was performed following the standard protocol without intravenous contrast. Multiplanar CT image reconstructions of the cervical spine were also generated. RADIATION DOSE REDUCTION: This exam was performed according to the departmental dose-optimization program which includes automated exposure control, adjustment of the mA and/or kV according to patient size and/or use of iterative reconstruction  technique. COMPARISON:  CT Head 08/29/21 FINDINGS: CT HEAD FINDINGS Brain: No evidence of acute infarction, hemorrhage, hydrocephalus, extra-axial collection or mass lesion/mass effect. 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 Sequela of moderate chronic microvascular ischemic change. Generalized volume loss. Vascular: No hyperdense vessel or unexpected calcification. Skull: Normal. Negative for fracture or focal lesion. Sinuses/Orbits: No middle ear or mastoid effusion. Paranasal sinuses are clear. Bilateral lens replacement. Orbits are otherwise unremarkable. Other: None. CT CERVICAL SPINE FINDINGS Alignment: Trace anterolisthesis of C3 on C4 and C4 on C5 Skull base and vertebrae: No acute fracture. No primary bone lesion or focal pathologic process. Unchanged appearance of the superior endplate of C7, likely degenerative. Soft tissues and spinal canal: No prevertebral fluid or swelling. No visible canal hematoma. Disc levels:  No evidence of high-grade spinal canal stenosis. Upper chest: Negative. Other: None IMPRESSION: 1. No acute intracranial abnormality. 2. No acute cervical spine fracture or traumatic malalignment. Electronically Signed   By: Lorenza Cambridge M.D.   On: 01/12/2023 16:21   CT Cervical Spine Wo Contrast  Result Date: 01/12/2023 CLINICAL DATA:  Trauma EXAM: CT HEAD WITHOUT CONTRAST CT CERVICAL SPINE WITHOUT CONTRAST TECHNIQUE: Multidetector CT imaging of the head and cervical spine was performed following the standard protocol without intravenous contrast. Multiplanar CT image reconstructions of the cervical spine were also generated. RADIATION DOSE REDUCTION: This exam was performed according to the departmental dose-optimization program which includes automated exposure control, adjustment of the mA and/or kV according to patient size and/or use of iterative reconstruction technique. COMPARISON:  CT Head 08/29/21 FINDINGS: CT HEAD FINDINGS Brain: No evidence of acute infarction,  hemorrhage, hydrocephalus, extra-axial collection or mass lesion/mass effect. 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 Sequela of moderate chronic microvascular ischemic change. Generalized volume loss. Vascular: No hyperdense vessel or unexpected calcification. Skull: Normal. Negative for fracture or focal lesion. Sinuses/Orbits: No middle ear or mastoid effusion. Paranasal sinuses are clear. Bilateral lens replacement. Orbits are otherwise unremarkable. Other: None. CT CERVICAL SPINE FINDINGS Alignment: Trace anterolisthesis of C3 on C4 and C4 on C5 Skull base and vertebrae: No acute fracture. No primary bone lesion or focal pathologic process. Unchanged appearance of the superior endplate of C7, likely degenerative. Soft tissues and spinal canal: No prevertebral fluid or swelling. No visible canal hematoma. Disc levels:  No evidence of high-grade spinal canal stenosis. Upper chest: Negative. Other: None IMPRESSION: 1. No acute intracranial abnormality. 2. No acute cervical spine fracture or traumatic malalignment. Electronically Signed   By: Lorenza Cambridge M.D.   On: 01/12/2023 16:21    Assessment and Plan:   ROMOLO SIELING is a 77 y.o. y/o male with stage III recurrent mantle cell lymphoma here to see me for diarrhea ongoing since January low potassium thrombocytopenia last checked his potassium was 3.8 on 01/25/2023 and platelet count was 44.  Stool studies have been negative for infection.The Glucerna shakes that the patient is on has sucralose which has a laxative effect.  In addition he takes multiple aspirins a day which at times can cause inflammation and diarrhea.  I have advised him to stop it he also uses sweet and low which have advised him to cut  Plan 1.  Recheck CBC to ensure platelet count is over 50 to permit a colonoscopy and biopsies 2.  Celiac serology, fecal calprotectin, TSH 3.  Avoid any feeding supplements such as Glucerna which has artificial sugars such as sucralose which has  laxative properties and can cause diarrhea.  Avoid foods with high fructose corn syrup artificial sugars and sweeteners 4.  Questran 1 to 3 packets a day as needed for diarrhea in addition can take Imodium while the obtaining further information about his diarrhea from testing  Follow up in 4-6 weeks   Dr [name] MD,MRCP(U.K)

## 2023-01-28 NOTE — Addendum Note (Signed)
Addended by: Adela Ports on: 01/28/2023 03:07 PM   Modules accepted: Orders

## 2023-01-29 DIAGNOSIS — I10 Essential (primary) hypertension: Secondary | ICD-10-CM | POA: Diagnosis not present

## 2023-01-29 DIAGNOSIS — E039 Hypothyroidism, unspecified: Secondary | ICD-10-CM | POA: Diagnosis not present

## 2023-01-29 DIAGNOSIS — F320 Major depressive disorder, single episode, mild: Secondary | ICD-10-CM | POA: Diagnosis not present

## 2023-01-29 DIAGNOSIS — R112 Nausea with vomiting, unspecified: Secondary | ICD-10-CM | POA: Diagnosis not present

## 2023-01-29 DIAGNOSIS — E7800 Pure hypercholesterolemia, unspecified: Secondary | ICD-10-CM | POA: Diagnosis not present

## 2023-01-29 DIAGNOSIS — K591 Functional diarrhea: Secondary | ICD-10-CM | POA: Diagnosis not present

## 2023-01-29 DIAGNOSIS — D696 Thrombocytopenia, unspecified: Secondary | ICD-10-CM | POA: Diagnosis not present

## 2023-01-29 DIAGNOSIS — D510 Vitamin B12 deficiency anemia due to intrinsic factor deficiency: Secondary | ICD-10-CM | POA: Diagnosis not present

## 2023-01-29 DIAGNOSIS — N1832 Chronic kidney disease, stage 3b: Secondary | ICD-10-CM | POA: Diagnosis not present

## 2023-01-29 DIAGNOSIS — E119 Type 2 diabetes mellitus without complications: Secondary | ICD-10-CM | POA: Diagnosis not present

## 2023-01-29 LAB — CBC WITH DIFFERENTIAL/PLATELET
Basophils Absolute: 0.1 10*3/uL (ref 0.0–0.2)
Immature Grans (Abs): 0 10*3/uL (ref 0.0–0.1)
Lymphocytes Absolute: 1.2 10*3/uL (ref 0.7–3.1)
MCHC: 32 g/dL (ref 31.5–35.7)
MCV: 92 fL (ref 79–97)
Monocytes: 19 %
Neutrophils Absolute: 1 10*3/uL — ABNORMAL LOW (ref 1.4–7.0)

## 2023-01-29 LAB — TSH: TSH: 5.85 u[IU]/mL — ABNORMAL HIGH (ref 0.450–4.500)

## 2023-01-29 LAB — CELIAC DISEASE AB SCREEN W/RFX

## 2023-01-29 LAB — PROTIME-INR: INR: 1.2 (ref 0.9–1.2)

## 2023-01-31 LAB — CBC WITH DIFFERENTIAL/PLATELET
Basos: 2 %
EOS (ABSOLUTE): 0.1 10*3/uL (ref 0.0–0.4)
Eos: 4 %
Hematocrit: 30 % — ABNORMAL LOW (ref 37.5–51.0)
Hemoglobin: 9.6 g/dL — ABNORMAL LOW (ref 13.0–17.7)
Immature Granulocytes: 1 %
Lymphs: 41 %
MCH: 29.3 pg (ref 26.6–33.0)
Monocytes Absolute: 0.6 10*3/uL (ref 0.1–0.9)
NRBC: 1 % — ABNORMAL HIGH (ref 0–0)
Neutrophils: 33 %
Platelets: 110 10*3/uL — ABNORMAL LOW (ref 150–450)
RBC: 3.28 x10E6/uL — ABNORMAL LOW (ref 4.14–5.80)
RDW: 14.3 % (ref 11.6–15.4)
WBC: 3.1 10*3/uL — ABNORMAL LOW (ref 3.4–10.8)

## 2023-01-31 LAB — PROTIME-INR: Prothrombin Time: 12.4 s — ABNORMAL HIGH (ref 9.1–12.0)

## 2023-01-31 LAB — CELIAC DISEASE AB SCREEN W/RFX
Antigliadin Abs, IgA: 3 units (ref 0–19)
IgA/Immunoglobulin A, Serum: 168 mg/dL (ref 61–437)

## 2023-02-01 ENCOUNTER — Other Ambulatory Visit: Payer: Self-pay

## 2023-02-01 DIAGNOSIS — I10 Essential (primary) hypertension: Secondary | ICD-10-CM | POA: Diagnosis not present

## 2023-02-01 DIAGNOSIS — E119 Type 2 diabetes mellitus without complications: Secondary | ICD-10-CM | POA: Diagnosis not present

## 2023-02-01 DIAGNOSIS — C8318 Mantle cell lymphoma, lymph nodes of multiple sites: Secondary | ICD-10-CM

## 2023-02-01 DIAGNOSIS — R112 Nausea with vomiting, unspecified: Secondary | ICD-10-CM | POA: Diagnosis not present

## 2023-02-02 ENCOUNTER — Telehealth: Payer: Self-pay

## 2023-02-02 ENCOUNTER — Inpatient Hospital Stay: Payer: Medicare Other

## 2023-02-02 ENCOUNTER — Encounter: Payer: Self-pay | Admitting: Oncology

## 2023-02-02 ENCOUNTER — Other Ambulatory Visit: Payer: Self-pay

## 2023-02-02 ENCOUNTER — Inpatient Hospital Stay (HOSPITAL_BASED_OUTPATIENT_CLINIC_OR_DEPARTMENT_OTHER): Payer: Medicare Other | Admitting: Oncology

## 2023-02-02 VITALS — BP 103/54 | HR 84 | Temp 97.7°F | Resp 18

## 2023-02-02 DIAGNOSIS — E119 Type 2 diabetes mellitus without complications: Secondary | ICD-10-CM | POA: Diagnosis not present

## 2023-02-02 DIAGNOSIS — D72819 Decreased white blood cell count, unspecified: Secondary | ICD-10-CM | POA: Diagnosis not present

## 2023-02-02 DIAGNOSIS — N289 Disorder of kidney and ureter, unspecified: Secondary | ICD-10-CM | POA: Diagnosis not present

## 2023-02-02 DIAGNOSIS — I10 Essential (primary) hypertension: Secondary | ICD-10-CM | POA: Diagnosis not present

## 2023-02-02 DIAGNOSIS — R001 Bradycardia, unspecified: Secondary | ICD-10-CM | POA: Diagnosis not present

## 2023-02-02 DIAGNOSIS — K591 Functional diarrhea: Secondary | ICD-10-CM | POA: Diagnosis not present

## 2023-02-02 DIAGNOSIS — F320 Major depressive disorder, single episode, mild: Secondary | ICD-10-CM | POA: Diagnosis not present

## 2023-02-02 DIAGNOSIS — E7800 Pure hypercholesterolemia, unspecified: Secondary | ICD-10-CM | POA: Diagnosis not present

## 2023-02-02 DIAGNOSIS — E039 Hypothyroidism, unspecified: Secondary | ICD-10-CM | POA: Diagnosis not present

## 2023-02-02 DIAGNOSIS — C8315 Mantle cell lymphoma, lymph nodes of inguinal region and lower limb: Secondary | ICD-10-CM | POA: Diagnosis not present

## 2023-02-02 DIAGNOSIS — C8318 Mantle cell lymphoma, lymph nodes of multiple sites: Secondary | ICD-10-CM | POA: Diagnosis not present

## 2023-02-02 DIAGNOSIS — D649 Anemia, unspecified: Secondary | ICD-10-CM | POA: Diagnosis not present

## 2023-02-02 DIAGNOSIS — E1165 Type 2 diabetes mellitus with hyperglycemia: Secondary | ICD-10-CM | POA: Diagnosis not present

## 2023-02-02 DIAGNOSIS — D696 Thrombocytopenia, unspecified: Secondary | ICD-10-CM | POA: Diagnosis not present

## 2023-02-02 DIAGNOSIS — R112 Nausea with vomiting, unspecified: Secondary | ICD-10-CM | POA: Diagnosis not present

## 2023-02-02 DIAGNOSIS — I9589 Other hypotension: Secondary | ICD-10-CM | POA: Diagnosis not present

## 2023-02-02 DIAGNOSIS — D510 Vitamin B12 deficiency anemia due to intrinsic factor deficiency: Secondary | ICD-10-CM | POA: Diagnosis not present

## 2023-02-02 DIAGNOSIS — C8310 Mantle cell lymphoma, unspecified site: Secondary | ICD-10-CM | POA: Diagnosis not present

## 2023-02-02 DIAGNOSIS — N1832 Chronic kidney disease, stage 3b: Secondary | ICD-10-CM | POA: Diagnosis not present

## 2023-02-02 LAB — CBC WITH DIFFERENTIAL (CANCER CENTER ONLY)
Abs Immature Granulocytes: 0.03 10*3/uL (ref 0.00–0.07)
Basophils Absolute: 0 10*3/uL (ref 0.0–0.1)
Basophils Relative: 1 %
Eosinophils Absolute: 0.3 10*3/uL (ref 0.0–0.5)
Eosinophils Relative: 10 %
HCT: 31.3 % — ABNORMAL LOW (ref 39.0–52.0)
Hemoglobin: 9.9 g/dL — ABNORMAL LOW (ref 13.0–17.0)
Immature Granulocytes: 1 %
Lymphocytes Relative: 30 %
Lymphs Abs: 0.9 10*3/uL (ref 0.7–4.0)
MCH: 29.4 pg (ref 26.0–34.0)
MCHC: 31.6 g/dL (ref 30.0–36.0)
MCV: 92.9 fL (ref 80.0–100.0)
Monocytes Absolute: 0.6 10*3/uL (ref 0.1–1.0)
Monocytes Relative: 19 %
Neutro Abs: 1.2 10*3/uL — ABNORMAL LOW (ref 1.7–7.7)
Neutrophils Relative %: 39 %
Platelet Count: 120 10*3/uL — ABNORMAL LOW (ref 150–400)
RBC: 3.37 MIL/uL — ABNORMAL LOW (ref 4.22–5.81)
RDW: 15.2 % (ref 11.5–15.5)
WBC Count: 3 10*3/uL — ABNORMAL LOW (ref 4.0–10.5)
nRBC: 0 % (ref 0.0–0.2)

## 2023-02-02 LAB — CMP (CANCER CENTER ONLY)
ALT: 11 U/L (ref 0–44)
AST: 27 U/L (ref 15–41)
Albumin: 2.4 g/dL — ABNORMAL LOW (ref 3.5–5.0)
Alkaline Phosphatase: 101 U/L (ref 38–126)
Anion gap: 10 (ref 5–15)
BUN: 20 mg/dL (ref 8–23)
CO2: 26 mmol/L (ref 22–32)
Calcium: 7.8 mg/dL — ABNORMAL LOW (ref 8.9–10.3)
Chloride: 97 mmol/L — ABNORMAL LOW (ref 98–111)
Creatinine: 1.84 mg/dL — ABNORMAL HIGH (ref 0.61–1.24)
GFR, Estimated: 38 mL/min — ABNORMAL LOW (ref >60)
Glucose, Bld: 93 mg/dL (ref 70–99)
Potassium: 4.4 mmol/L (ref 3.5–5.1)
Sodium: 133 mmol/L — ABNORMAL LOW (ref 135–145)
Total Bilirubin: 0.6 mg/dL (ref 0.3–1.2)
Total Protein: 5.2 g/dL — ABNORMAL LOW (ref 6.5–8.1)

## 2023-02-02 MED ORDER — NA SULFATE-K SULFATE-MG SULF 17.5-3.13-1.6 GM/177ML PO SOLN
1.0000 | Freq: Once | ORAL | 0 refills | Status: AC
  Filled 2023-02-02: qty 354, 1d supply, fill #0

## 2023-02-02 NOTE — Progress Notes (Signed)
[facility]  [phone] Fax:([phone]) [phone]  ID: Gary [name] OB: April 23, 1946  MR#: [id number]  AOZ#:[id number]  Patient Care Team: Dale [place], MD as PCP - General (Internal Medicine) Jeralyn Ruths, MD as Consulting Physician (Oncology) Wyn Quaker Marlow Baars, MD as Referring Physician (Vascular Surgery) Schnier, Latina Craver, MD (Vascular Surgery) Lamar Blinks, MD as Consulting Physician (Cardiology) Alwyn Pea, MD as Consulting Physician (Cardiology) [name], MD as Consulting Physician (Gastroenterology)  CHIEF COMPLAINT: Recurrent Mantle cell lymphoma.  INTERVAL HISTORY: Patient returns to clinic today for further evaluation and hospital follow-up.  He is now in a rehab center.  He continues to feel terrible, but improved since discharge.  His diarrhea is better, but still evident.  He has significant weakness and fatigue.  He denies any fevers, chills, or night sweats.  He has no neurologic complaints.  He has dyspnea on exertion, but denies any chest pain, shortness of breath, cough, or hemoptysis.  He has no nausea, vomiting, or constipation.  He denies any melena or hematochezia.  He has no urinary complaints.  Patient offers no further specific complaints today.  Review of Systems  Constitutional:  Positive for malaise/fatigue. Negative for diaphoresis, fever and weight loss.  Respiratory: Negative.  Negative for cough, hemoptysis and shortness of breath.   Cardiovascular: Negative.  Negative for chest pain and leg swelling.  Gastrointestinal:  Positive for diarrhea. Negative for abdominal pain.  Genitourinary: Negative.  Negative for dysuria.  Musculoskeletal: Negative.  Negative for back pain.  Skin: Negative.  Negative for rash.  Neurological:  Positive for weakness. Negative for dizziness, focal weakness and headaches.  Psychiatric/Behavioral: Negative.  The patient is not nervous/anxious.     As per HPI. Otherwise, a complete  review of systems is negative.  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    Gastroparesis    Graves disease    Hypercholesterolemia    Hypertension    Mantle cell lymphoma 07/2014   Peripheral neuropathy    Shingles     PAST SURGICAL HISTORY: Past Surgical History:  Procedure Laterality Date   CHOLECYSTECTOMY     ESOPHAGOGASTRODUODENOSCOPY (EGD) WITH PROPOFOL N/A 01/18/2023   Procedure: ESOPHAGOGASTRODUODENOSCOPY (EGD) WITH PROPOFOL;  Surgeon: Jaynie Collins, DO;  Location: [facility] ENDOSCOPY;  Service: Gastroenterology;  Laterality: N/A;   NEPHRECTOMY     right   PACEMAKER INSERTION N/A 12/07/2017   Procedure: INSERTION PACEMAKER DUAL CHAMBER INITIAL INSERT;  Surgeon: Marcina Millard, MD;  Location: [facility] ORS;  Service: Cardiovascular;  Laterality: N/A;   PORTA CATH INSERTION N/A 11/03/2017   Procedure: PORTA CATH INSERTION;  Surgeon: Renford Dills, MD;  Location: [facility] INVASIVE CV LAB;  Service: Cardiovascular;  Laterality: N/A;    FAMILY HISTORY Family History  Problem Relation Age of Onset   Breast cancer Mother    Diabetes Father    Cancer Father        Lymphoma   Alcohol abuse Maternal Uncle    Diabetes Sister    Heart disease Other        grandfather       ADVANCED DIRECTIVES:    HEALTH MAINTENANCE: Social History   Tobacco Use   Smoking status: Former    Types: Cigarettes    Passive exposure: Past   Smokeless tobacco: Current    Types: Chew  Vaping  Use   Vaping Use: Never used  Substance Use Topics   Alcohol use: Yes    Alcohol/week: 3.0 standard drinks of alcohol    Types: 3 Cans of beer per week    Comment: nightly   Drug use: Never     Colonoscopy:  PAP:  Bone density:  Lipid panel:  Allergies  Allergen Reactions   Rofecoxib Nausea Only    Current Outpatient  Medications  Medication Sig Dispense Refill   Cholecalciferol (VITAMIN D3) 50 MCG (2000 UT) CAPS Take 2,000 Units by mouth daily.     cholestyramine (QUESTRAN) 4 g packet Take 1 packet (4 g total) by mouth 2 (two) times daily. 60 each 12   feeding supplement, GLUCERNA SHAKE, (GLUCERNA SHAKE) LIQD Take 237 mLs by mouth 2 (two) times daily between meals. 237 mL 0   ferrous sulfate 325 (65 FE) MG tablet Take 325 mg by mouth daily with breakfast.     FLUoxetine (PROZAC) 20 MG tablet Take 1 tablet (20 mg total) by mouth daily. 30 tablet 2   furosemide (LASIX) 40 MG tablet Take 2 tablets in the morning, and 1 tablet in the evening. 90 tablet 6   insulin aspart (NOVOLOG) 100 UNIT/ML injection Inject 6 Units into the skin 3 (three) times daily with meals.     Insulin Human (INSULIN PUMP) SOLN Inject into the skin.     lactobacillus (FLORANEX/LACTINEX) PACK Take 1 packet (1 g total) by mouth 3 (three) times daily with meals.     levothyroxine (SYNTHROID) 50 MCG tablet Take 1 tablet (50 mcg total) by mouth daily before breakfast.     lidocaine-prilocaine (EMLA) cream Apply 1 Application topically once.     loperamide (IMODIUM) 2 MG capsule Take 1 capsule (2 mg total) by mouth as needed for diarrhea or loose stools. 30 capsule 0   midodrine (PROAMATINE) 10 MG tablet Take 1 tablet (10 mg total) by mouth 3 (three) times daily with meals.     nystatin cream (MYCOSTATIN) APPLY TO AFFECTED AREA TWICE A DAY 30 g 1   pantoprazole (PROTONIX) 40 MG tablet Take 1 tablet (40 mg total) by mouth 2 (two) times daily before a meal.     potassium chloride SA (KLOR-CON M) 20 MEQ tablet Take 1 tablet (20 mEq total) by mouth 2 (two) times daily. 7 tablet 0   pravastatin (PRAVACHOL) 40 MG tablet TAKE 1 TABLET BY MOUTH EVERY DAY 90 tablet 1   vitamin B-12 (CYANOCOBALAMIN) 1000 MCG tablet Take 1,000 mcg by mouth daily.     glucose 4 GM chewable tablet Chew 1 tablet (4 g total) by mouth once as needed for low blood sugar.  (Patient not taking: Reported on 02/02/2023) 50 tablet 0   No current facility-administered medications for this visit.   Facility-Administered Medications Ordered in Other Visits  Medication Dose Route Frequency Provider Last Rate Last Admin   heparin lock flush 100 unit/mL  500 Units Intravenous Once Jeralyn Ruths, MD       sodium chloride flush (NS) 0.9 % injection 10 mL  10 mL Intravenous PRN Jeralyn Ruths, MD   10 mL at 02/01/18 0829   sodium chloride flush (NS) 0.9 % injection 10 mL  10 mL Intravenous PRN Jeralyn Ruths, MD   10 mL at 04/05/18 0800    OBJECTIVE: Vitals:   02/02/23 1112  BP: (!) 103/54  Pulse: 84  Resp: 18  Temp: 97.7 F (36.5 C)  SpO2: 94%  There is no height or weight on file to calculate BMI.    ECOG FS:2 - Symptomatic, <50% confined to bed  General: Well-developed, well-nourished, no acute distress.  Sitting in a wheelchair. Eyes: Pink conjunctiva, anicteric sclera. HEENT: Normocephalic, moist mucous membranes. Lungs: No audible wheezing or coughing. Heart: Regular rate and rhythm. Abdomen: Soft, nontender, no obvious distention. Musculoskeletal: No edema, cyanosis, or clubbing. Neuro: Alert, answering all questions appropriately. Cranial nerves grossly intact. Skin: No rashes or petechiae noted. Psych: Normal affect.  LAB RESULTS:  Lab Results  Component Value Date   NA 133 (L) 02/02/2023   K 4.4 02/02/2023   CL 97 (L) 02/02/2023   CO2 26 02/02/2023   GLUCOSE 93 02/02/2023   BUN 20 02/02/2023   CREATININE 1.84 (H) 02/02/2023   CALCIUM 7.8 (L) 02/02/2023   PROT 5.2 (L) 02/02/2023   ALBUMIN 2.4 (L) 02/02/2023   AST 27 02/02/2023   ALT 11 02/02/2023   ALKPHOS 101 02/02/2023   BILITOT 0.6 02/02/2023   GFRNONAA 38 (L) 02/02/2023   GFRAA 41 (L) 01/24/2020    Lab Results  Component Value Date   WBC 3.0 (L) 02/02/2023   NEUTROABS 1.2 (L) 02/02/2023   HGB 9.9 (L) 02/02/2023   HCT 31.3 (L) 02/02/2023   MCV 92.9  02/02/2023   PLT 120 (L) 02/02/2023   Lab Results  Component Value Date   IRON 13 (L) 08/11/2017   TIBC 223 (L) 01/12/2017   IRONPCTSAT 3.8 (L) 08/11/2017   Lab Results  Component Value Date   FERRITIN 42.0 08/11/2017     STUDIES:  ONCOLOGY HISTORY:  Patient's initial diagnosis was on inguinal lymph node biopsy on July 12, 2014.  Patient was noted to have progressive disease and underwent chemotherapy using Rituxan and Treanda completing cycle 4 on Mar 09, 2018.  CT scan on August 07, 2022 reviewed independently with clearly progressive disease with increasing lymphadenopathy.  PET scan results from August 24, 2022 confirmed recurrent disease.  Patient then underwent 3 additional cycles of Rituxan plus Treanda completing on November 06, 2022.  Treatment was discontinued secondary to declining performance status.  PET scan results from December 10, 2022 revealed an least a very good partial remission.   ASSESSMENT: Recurrent low-grade mantle cell lymphoma. PLAN:    Mantle cell lymphoma, stage III: See oncology history as above.  Patient last received chemotherapy with Rituxan and Treanda on November 06, 2022.  PET scan results from December 10, 2022 reviewed independently with significant interval response to therapy and no progressive disease identified.  No further intervention is needed.  Patient has been instructed to keep his previously scheduled follow-up imaging on Mar 10, 2023 with follow-up several days later for further evaluation.   Decreased ejection fraction: Improved.  Patient's most recent cardiac echo in November 2020 revealed an ejection fraction estimated to be 50 to 55%. Continue monitoring and evaluation per cardiology. Hyperglycemia: Patient has significantly improved blood glucose control.  Continue insulin pump as directed. Thrombocytopenia: Improved.  Patient's most recent platelet count is 120. Anemia: Chronic and unchanged.  Patient's hemoglobin is  9.9. Leukopenia: Chronic and unchanged.  Patient's total white blood cell count is 3.0. Bradycardia: Patient has pacemaker in place. Renal insufficiency: Patient's creatinine previously was within normal range, today's noted to be slightly elevated at 1.84.  Monitor.  Neither Treanda or Rituxan need to be dose reduced with renal insufficiency. Diarrhea: Highly unlikely related to chemotherapy given 3 months ago.  Treanda only lists a 1-3% incidence of  grade 3 or 4 diarrhea.  Rituxan only lists a 1% incidence.  In addition, patient received these treatments previously with no diarrhea.  Continue follow-up with GI as scheduled.   Patient expressed understanding and was in agreement with this plan. He also understands that He can call clinic at any time with any questions, concerns, or complaints.    Jeralyn Ruths, MD   02/02/2023 12:07 PM

## 2023-02-02 NOTE — Progress Notes (Signed)
Concerns of SOB and diarrhea

## 2023-02-02 NOTE — Telephone Encounter (Signed)
New colonoscopy prep prescription was requested by patients rehab nurse Tamala Ser at [facility] to be sent to [facility] instead of [facility].  Rx for Suprep has been sent.  Thanks, [place], [place]

## 2023-02-03 ENCOUNTER — Encounter: Payer: Self-pay | Admitting: Gastroenterology

## 2023-02-03 ENCOUNTER — Ambulatory Visit
Admission: RE | Admit: 2023-02-03 | Discharge: 2023-02-03 | Disposition: A | Discharge status: Home / Self Care | Payer: Medicare Other | Source: Ambulatory Visit | Attending: Internal Medicine | Admitting: Internal Medicine

## 2023-02-03 DIAGNOSIS — E1122 Type 2 diabetes mellitus with diabetic chronic kidney disease: Secondary | ICD-10-CM | POA: Diagnosis not present

## 2023-02-03 DIAGNOSIS — L89312 Pressure ulcer of right buttock, stage 2: Secondary | ICD-10-CM | POA: Diagnosis present

## 2023-02-03 DIAGNOSIS — R69 Illness, unspecified: Secondary | ICD-10-CM | POA: Diagnosis not present

## 2023-02-03 DIAGNOSIS — R112 Nausea with vomiting, unspecified: Secondary | ICD-10-CM | POA: Diagnosis not present

## 2023-02-03 DIAGNOSIS — I5022 Chronic systolic (congestive) heart failure: Secondary | ICD-10-CM | POA: Diagnosis not present

## 2023-02-03 DIAGNOSIS — W19XXXA Unspecified fall, initial encounter: Secondary | ICD-10-CM | POA: Diagnosis not present

## 2023-02-03 DIAGNOSIS — E1065 Type 1 diabetes mellitus with hyperglycemia: Secondary | ICD-10-CM | POA: Diagnosis present

## 2023-02-03 DIAGNOSIS — D631 Anemia in chronic kidney disease: Secondary | ICD-10-CM | POA: Diagnosis not present

## 2023-02-03 DIAGNOSIS — I110 Hypertensive heart disease with heart failure: Secondary | ICD-10-CM | POA: Diagnosis present

## 2023-02-03 DIAGNOSIS — R2681 Unsteadiness on feet: Secondary | ICD-10-CM | POA: Diagnosis not present

## 2023-02-03 DIAGNOSIS — D630 Anemia in neoplastic disease: Secondary | ICD-10-CM | POA: Diagnosis present

## 2023-02-03 DIAGNOSIS — Z794 Long term (current) use of insulin: Secondary | ICD-10-CM | POA: Diagnosis not present

## 2023-02-03 DIAGNOSIS — R0600 Dyspnea, unspecified: Secondary | ICD-10-CM | POA: Insufficient documentation

## 2023-02-03 DIAGNOSIS — D122 Benign neoplasm of ascending colon: Secondary | ICD-10-CM | POA: Diagnosis not present

## 2023-02-03 DIAGNOSIS — Z87891 Personal history of nicotine dependence: Secondary | ICD-10-CM | POA: Diagnosis not present

## 2023-02-03 DIAGNOSIS — I429 Cardiomyopathy, unspecified: Secondary | ICD-10-CM | POA: Insufficient documentation

## 2023-02-03 DIAGNOSIS — L89159 Pressure ulcer of sacral region, unspecified stage: Secondary | ICD-10-CM | POA: Diagnosis not present

## 2023-02-03 DIAGNOSIS — S0101XA Laceration without foreign body of scalp, initial encounter: Secondary | ICD-10-CM | POA: Diagnosis not present

## 2023-02-03 DIAGNOSIS — Z950 Presence of cardiac pacemaker: Secondary | ICD-10-CM | POA: Insufficient documentation

## 2023-02-03 DIAGNOSIS — Y92129 Unspecified place in nursing home as the place of occurrence of the external cause: Secondary | ICD-10-CM | POA: Diagnosis not present

## 2023-02-03 DIAGNOSIS — R4182 Altered mental status, unspecified: Secondary | ICD-10-CM | POA: Diagnosis not present

## 2023-02-03 DIAGNOSIS — S0990XA Unspecified injury of head, initial encounter: Secondary | ICD-10-CM | POA: Diagnosis not present

## 2023-02-03 DIAGNOSIS — Z7189 Other specified counseling: Secondary | ICD-10-CM | POA: Diagnosis not present

## 2023-02-03 DIAGNOSIS — M6281 Muscle weakness (generalized): Secondary | ICD-10-CM | POA: Diagnosis not present

## 2023-02-03 DIAGNOSIS — N179 Acute kidney failure, unspecified: Secondary | ICD-10-CM | POA: Diagnosis not present

## 2023-02-03 DIAGNOSIS — R8281 Pyuria: Secondary | ICD-10-CM | POA: Diagnosis not present

## 2023-02-03 DIAGNOSIS — R296 Repeated falls: Secondary | ICD-10-CM | POA: Diagnosis not present

## 2023-02-03 DIAGNOSIS — K635 Polyp of colon: Secondary | ICD-10-CM | POA: Diagnosis not present

## 2023-02-03 DIAGNOSIS — L89156 Pressure-induced deep tissue damage of sacral region: Secondary | ICD-10-CM | POA: Diagnosis present

## 2023-02-03 DIAGNOSIS — Z515 Encounter for palliative care: Secondary | ICD-10-CM | POA: Diagnosis not present

## 2023-02-03 DIAGNOSIS — I25119 Atherosclerotic heart disease of native coronary artery with unspecified angina pectoris: Secondary | ICD-10-CM | POA: Insufficient documentation

## 2023-02-03 DIAGNOSIS — I493 Ventricular premature depolarization: Secondary | ICD-10-CM | POA: Insufficient documentation

## 2023-02-03 DIAGNOSIS — N1831 Chronic kidney disease, stage 3a: Secondary | ICD-10-CM | POA: Diagnosis not present

## 2023-02-03 DIAGNOSIS — Z66 Do not resuscitate: Secondary | ICD-10-CM | POA: Diagnosis not present

## 2023-02-03 DIAGNOSIS — W19XXXD Unspecified fall, subsequent encounter: Secondary | ICD-10-CM | POA: Diagnosis not present

## 2023-02-03 DIAGNOSIS — R499 Unspecified voice and resonance disorder: Secondary | ICD-10-CM | POA: Diagnosis not present

## 2023-02-03 DIAGNOSIS — I10 Essential (primary) hypertension: Secondary | ICD-10-CM | POA: Diagnosis not present

## 2023-02-03 DIAGNOSIS — I2510 Atherosclerotic heart disease of native coronary artery without angina pectoris: Secondary | ICD-10-CM | POA: Diagnosis not present

## 2023-02-03 DIAGNOSIS — Z043 Encounter for examination and observation following other accident: Secondary | ICD-10-CM | POA: Diagnosis not present

## 2023-02-03 DIAGNOSIS — S12631A Unspecified traumatic nondisplaced spondylolisthesis of seventh cervical vertebra, initial encounter for closed fracture: Secondary | ICD-10-CM | POA: Diagnosis not present

## 2023-02-03 DIAGNOSIS — R197 Diarrhea, unspecified: Secondary | ICD-10-CM | POA: Diagnosis not present

## 2023-02-03 DIAGNOSIS — L89322 Pressure ulcer of left buttock, stage 2: Secondary | ICD-10-CM | POA: Diagnosis present

## 2023-02-03 DIAGNOSIS — I6782 Cerebral ischemia: Secondary | ICD-10-CM | POA: Diagnosis not present

## 2023-02-03 DIAGNOSIS — E1043 Type 1 diabetes mellitus with diabetic autonomic (poly)neuropathy: Secondary | ICD-10-CM | POA: Diagnosis present

## 2023-02-03 DIAGNOSIS — E1042 Type 1 diabetes mellitus with diabetic polyneuropathy: Secondary | ICD-10-CM | POA: Diagnosis present

## 2023-02-03 DIAGNOSIS — W0110XA Fall on same level from slipping, tripping and stumbling with subsequent striking against unspecified object, initial encounter: Secondary | ICD-10-CM | POA: Diagnosis present

## 2023-02-03 DIAGNOSIS — D126 Benign neoplasm of colon, unspecified: Secondary | ICD-10-CM | POA: Diagnosis not present

## 2023-02-03 DIAGNOSIS — D638 Anemia in other chronic diseases classified elsewhere: Secondary | ICD-10-CM | POA: Diagnosis present

## 2023-02-03 DIAGNOSIS — I130 Hypertensive heart and chronic kidney disease with heart failure and stage 1 through stage 4 chronic kidney disease, or unspecified chronic kidney disease: Secondary | ICD-10-CM | POA: Diagnosis not present

## 2023-02-03 DIAGNOSIS — R1312 Dysphagia, oropharyngeal phase: Secondary | ICD-10-CM | POA: Diagnosis not present

## 2023-02-03 DIAGNOSIS — E039 Hypothyroidism, unspecified: Secondary | ICD-10-CM | POA: Diagnosis present

## 2023-02-03 DIAGNOSIS — S12691A Other nondisplaced fracture of seventh cervical vertebra, initial encounter for closed fracture: Secondary | ICD-10-CM | POA: Diagnosis not present

## 2023-02-03 DIAGNOSIS — Y92009 Unspecified place in unspecified non-institutional (private) residence as the place of occurrence of the external cause: Secondary | ICD-10-CM | POA: Diagnosis not present

## 2023-02-03 DIAGNOSIS — K649 Unspecified hemorrhoids: Secondary | ICD-10-CM | POA: Diagnosis not present

## 2023-02-03 DIAGNOSIS — D61818 Other pancytopenia: Secondary | ICD-10-CM | POA: Diagnosis present

## 2023-02-03 DIAGNOSIS — I2089 Other forms of angina pectoris: Secondary | ICD-10-CM

## 2023-02-03 DIAGNOSIS — E10311 Type 1 diabetes mellitus with unspecified diabetic retinopathy with macular edema: Secondary | ICD-10-CM | POA: Diagnosis present

## 2023-02-03 DIAGNOSIS — R490 Dysphonia: Secondary | ICD-10-CM | POA: Diagnosis not present

## 2023-02-03 DIAGNOSIS — Y9301 Activity, walking, marching and hiking: Secondary | ICD-10-CM | POA: Diagnosis present

## 2023-02-03 DIAGNOSIS — E0500 Thyrotoxicosis with diffuse goiter without thyrotoxic crisis or storm: Secondary | ICD-10-CM | POA: Diagnosis present

## 2023-02-03 DIAGNOSIS — Z741 Need for assistance with personal care: Secondary | ICD-10-CM | POA: Diagnosis not present

## 2023-02-03 DIAGNOSIS — M8580 Other specified disorders of bone density and structure, unspecified site: Secondary | ICD-10-CM | POA: Diagnosis not present

## 2023-02-03 DIAGNOSIS — D696 Thrombocytopenia, unspecified: Secondary | ICD-10-CM | POA: Diagnosis not present

## 2023-02-03 DIAGNOSIS — S199XXA Unspecified injury of neck, initial encounter: Secondary | ICD-10-CM | POA: Diagnosis not present

## 2023-02-03 DIAGNOSIS — R262 Difficulty in walking, not elsewhere classified: Secondary | ICD-10-CM | POA: Diagnosis not present

## 2023-02-03 DIAGNOSIS — I951 Orthostatic hypotension: Secondary | ICD-10-CM | POA: Diagnosis not present

## 2023-02-03 DIAGNOSIS — E109 Type 1 diabetes mellitus without complications: Secondary | ICD-10-CM | POA: Diagnosis not present

## 2023-02-03 DIAGNOSIS — W01198A Fall on same level from slipping, tripping and stumbling with subsequent striking against other object, initial encounter: Secondary | ICD-10-CM | POA: Diagnosis not present

## 2023-02-03 DIAGNOSIS — I959 Hypotension, unspecified: Secondary | ICD-10-CM | POA: Diagnosis not present

## 2023-02-03 DIAGNOSIS — D529 Folate deficiency anemia, unspecified: Secondary | ICD-10-CM | POA: Diagnosis not present

## 2023-02-03 DIAGNOSIS — K529 Noninfective gastroenteritis and colitis, unspecified: Secondary | ICD-10-CM | POA: Diagnosis not present

## 2023-02-03 DIAGNOSIS — E538 Deficiency of other specified B group vitamins: Secondary | ICD-10-CM | POA: Diagnosis not present

## 2023-02-03 DIAGNOSIS — N390 Urinary tract infection, site not specified: Secondary | ICD-10-CM | POA: Diagnosis present

## 2023-02-03 DIAGNOSIS — I509 Heart failure, unspecified: Secondary | ICD-10-CM | POA: Insufficient documentation

## 2023-02-03 DIAGNOSIS — D649 Anemia, unspecified: Secondary | ICD-10-CM | POA: Diagnosis not present

## 2023-02-03 DIAGNOSIS — F32A Depression, unspecified: Secondary | ICD-10-CM | POA: Diagnosis not present

## 2023-02-03 DIAGNOSIS — S0003XA Contusion of scalp, initial encounter: Secondary | ICD-10-CM | POA: Diagnosis not present

## 2023-02-03 DIAGNOSIS — M542 Cervicalgia: Secondary | ICD-10-CM | POA: Diagnosis not present

## 2023-02-03 DIAGNOSIS — K3184 Gastroparesis: Secondary | ICD-10-CM | POA: Diagnosis present

## 2023-02-03 DIAGNOSIS — S12601A Unspecified nondisplaced fracture of seventh cervical vertebra, initial encounter for closed fracture: Secondary | ICD-10-CM | POA: Diagnosis not present

## 2023-02-03 DIAGNOSIS — E785 Hyperlipidemia, unspecified: Secondary | ICD-10-CM | POA: Diagnosis not present

## 2023-02-03 DIAGNOSIS — C8310 Mantle cell lymphoma, unspecified site: Secondary | ICD-10-CM | POA: Diagnosis not present

## 2023-02-03 DIAGNOSIS — E7800 Pure hypercholesterolemia, unspecified: Secondary | ICD-10-CM | POA: Diagnosis not present

## 2023-02-03 DIAGNOSIS — S12600D Unspecified displaced fracture of seventh cervical vertebra, subsequent encounter for fracture with routine healing: Secondary | ICD-10-CM | POA: Diagnosis not present

## 2023-02-03 DIAGNOSIS — R531 Weakness: Secondary | ICD-10-CM | POA: Diagnosis present

## 2023-02-03 NOTE — Progress Notes (Signed)
*  PRELIMINARY RESULTS* Echocardiogram 2D Echocardiogram has been performed.  Carolyne Fiscal 02/03/2023, 12:22 PM

## 2023-02-04 ENCOUNTER — Ambulatory Visit (HOSPITAL_BASED_OUTPATIENT_CLINIC_OR_DEPARTMENT_OTHER)
Admission: RE | Admit: 2023-02-04 | Discharge: 2023-02-04 | Disposition: A | Discharge status: Home / Self Care | Payer: Medicare Other | Source: Home / Self Care | Attending: Gastroenterology | Admitting: Gastroenterology

## 2023-02-04 ENCOUNTER — Ambulatory Visit: Payer: Medicare Other | Admitting: Anesthesiology

## 2023-02-04 ENCOUNTER — Encounter
Admission: RE | Disposition: A | Discharge status: Home / Self Care | Payer: Self-pay | Source: Home / Self Care | Attending: Gastroenterology

## 2023-02-04 DIAGNOSIS — X58XXXA Exposure to other specified factors, initial encounter: Secondary | ICD-10-CM | POA: Insufficient documentation

## 2023-02-04 DIAGNOSIS — Z794 Long term (current) use of insulin: Secondary | ICD-10-CM | POA: Insufficient documentation

## 2023-02-04 DIAGNOSIS — Z8572 Personal history of non-Hodgkin lymphomas: Secondary | ICD-10-CM | POA: Insufficient documentation

## 2023-02-04 DIAGNOSIS — D126 Benign neoplasm of colon, unspecified: Secondary | ICD-10-CM

## 2023-02-04 DIAGNOSIS — Z950 Presence of cardiac pacemaker: Secondary | ICD-10-CM | POA: Insufficient documentation

## 2023-02-04 DIAGNOSIS — S30810A Abrasion of lower back and pelvis, initial encounter: Secondary | ICD-10-CM | POA: Insufficient documentation

## 2023-02-04 DIAGNOSIS — E1122 Type 2 diabetes mellitus with diabetic chronic kidney disease: Secondary | ICD-10-CM | POA: Diagnosis not present

## 2023-02-04 DIAGNOSIS — F32A Depression, unspecified: Secondary | ICD-10-CM | POA: Insufficient documentation

## 2023-02-04 DIAGNOSIS — Z905 Acquired absence of kidney: Secondary | ICD-10-CM | POA: Insufficient documentation

## 2023-02-04 DIAGNOSIS — I130 Hypertensive heart and chronic kidney disease with heart failure and stage 1 through stage 4 chronic kidney disease, or unspecified chronic kidney disease: Secondary | ICD-10-CM | POA: Diagnosis not present

## 2023-02-04 DIAGNOSIS — E0843 Diabetes mellitus due to underlying condition with diabetic autonomic (poly)neuropathy: Secondary | ICD-10-CM | POA: Insufficient documentation

## 2023-02-04 DIAGNOSIS — E0842 Diabetes mellitus due to underlying condition with diabetic polyneuropathy: Secondary | ICD-10-CM | POA: Insufficient documentation

## 2023-02-04 DIAGNOSIS — J449 Chronic obstructive pulmonary disease, unspecified: Secondary | ICD-10-CM | POA: Insufficient documentation

## 2023-02-04 DIAGNOSIS — I110 Hypertensive heart disease with heart failure: Secondary | ICD-10-CM | POA: Insufficient documentation

## 2023-02-04 DIAGNOSIS — Z9049 Acquired absence of other specified parts of digestive tract: Secondary | ICD-10-CM | POA: Insufficient documentation

## 2023-02-04 DIAGNOSIS — D122 Benign neoplasm of ascending colon: Secondary | ICD-10-CM | POA: Insufficient documentation

## 2023-02-04 DIAGNOSIS — I5022 Chronic systolic (congestive) heart failure: Secondary | ICD-10-CM | POA: Diagnosis not present

## 2023-02-04 DIAGNOSIS — I509 Heart failure, unspecified: Secondary | ICD-10-CM | POA: Insufficient documentation

## 2023-02-04 DIAGNOSIS — D631 Anemia in chronic kidney disease: Secondary | ICD-10-CM | POA: Diagnosis not present

## 2023-02-04 DIAGNOSIS — I2510 Atherosclerotic heart disease of native coronary artery without angina pectoris: Secondary | ICD-10-CM | POA: Insufficient documentation

## 2023-02-04 DIAGNOSIS — K3184 Gastroparesis: Secondary | ICD-10-CM | POA: Insufficient documentation

## 2023-02-04 DIAGNOSIS — K635 Polyp of colon: Secondary | ICD-10-CM | POA: Diagnosis not present

## 2023-02-04 DIAGNOSIS — K219 Gastro-esophageal reflux disease without esophagitis: Secondary | ICD-10-CM | POA: Insufficient documentation

## 2023-02-04 DIAGNOSIS — R791 Abnormal coagulation profile: Secondary | ICD-10-CM

## 2023-02-04 DIAGNOSIS — N1831 Chronic kidney disease, stage 3a: Secondary | ICD-10-CM | POA: Diagnosis not present

## 2023-02-04 DIAGNOSIS — K529 Noninfective gastroenteritis and colitis, unspecified: Secondary | ICD-10-CM | POA: Insufficient documentation

## 2023-02-04 DIAGNOSIS — E7800 Pure hypercholesterolemia, unspecified: Secondary | ICD-10-CM | POA: Insufficient documentation

## 2023-02-04 DIAGNOSIS — E039 Hypothyroidism, unspecified: Secondary | ICD-10-CM | POA: Insufficient documentation

## 2023-02-04 DIAGNOSIS — Z87891 Personal history of nicotine dependence: Secondary | ICD-10-CM | POA: Insufficient documentation

## 2023-02-04 DIAGNOSIS — R197 Diarrhea, unspecified: Secondary | ICD-10-CM

## 2023-02-04 HISTORY — PX: COLONOSCOPY WITH PROPOFOL: SHX5780

## 2023-02-04 LAB — ECHOCARDIOGRAM COMPLETE
AR max vel: 2.75 cm2
AV Area VTI: 2.72 cm2
AV Area mean vel: 2.65 cm2
AV Mean grad: 3 mmHg
AV Peak grad: 6.7 mmHg
Ao pk vel: 1.29 m/s
Area-P 1/2: 4.01 cm2
MV VTI: 2.67 cm2
S' Lateral: 3.3 cm

## 2023-02-04 LAB — GLUCOSE, CAPILLARY: Glucose-Capillary: 218 mg/dL — ABNORMAL HIGH (ref 70–99)

## 2023-02-04 SURGERY — COLONOSCOPY WITH PROPOFOL
Anesthesia: General

## 2023-02-04 MED ORDER — PHENYLEPHRINE 80 MCG/ML (10ML) SYRINGE FOR IV PUSH (FOR BLOOD PRESSURE SUPPORT)
PREFILLED_SYRINGE | INTRAVENOUS | Status: DC | PRN
  Administered 2023-02-04 (×5): 80 ug via INTRAVENOUS

## 2023-02-04 MED ORDER — PROPOFOL 10 MG/ML IV BOLUS
INTRAVENOUS | Status: DC | PRN
  Administered 2023-02-04: 40 mg via INTRAVENOUS
  Administered 2023-02-04: 10 mg via INTRAVENOUS

## 2023-02-04 MED ORDER — LIDOCAINE HCL (CARDIAC) PF 100 MG/5ML IV SOSY
PREFILLED_SYRINGE | INTRAVENOUS | Status: DC | PRN
  Administered 2023-02-04: 50 mg via INTRAVENOUS

## 2023-02-04 MED ORDER — SODIUM CHLORIDE 0.9 % IV SOLN: INTRAVENOUS | Status: DC

## 2023-02-04 MED ORDER — HEPARIN SOD (PORK) LOCK FLUSH 100 UNIT/ML IV SOLN
INTRAVENOUS | Status: AC
  Filled 2023-02-04: qty 5

## 2023-02-04 MED ORDER — PROPOFOL 500 MG/50ML IV EMUL
INTRAVENOUS | Status: DC | PRN
  Administered 2023-02-04: 75 ug/kg/min via INTRAVENOUS

## 2023-02-04 NOTE — Anesthesia Postprocedure Evaluation (Signed)
Anesthesia Post Note  Patient: [name]  Procedure(s) Performed: COLONOSCOPY WITH PROPOFOL  Patient location during evaluation: PACU Anesthesia Type: General Level of consciousness: awake and alert Pain management: pain level controlled Vital Signs Assessment: post-procedure vital signs reviewed and stable Respiratory status: spontaneous breathing, nonlabored ventilation, respiratory function stable and patient connected to nasal cannula oxygen Cardiovascular status: blood pressure returned to baseline and stable Postop Assessment: no apparent nausea or vomiting Anesthetic complications: no   No notable events documented.   Last Vitals:  Vitals:   02/04/23 1042 02/04/23 1154  BP: (!) 88/74 117/63  Pulse: 97 93  Resp: (!) 24 16  Temp: (!) 35.9 C   SpO2: 96% 95%    Last Pain:  Vitals:   02/04/23 1042  TempSrc: Temporal  PainSc:                  Cleda Mccreedy Denys Salinger

## 2023-02-04 NOTE — Op Note (Signed)
[facility]enter[facility] Patient Name: Gary [name] Procedure Date: 02/04/2023 10:58 AM MRN: [id number] Account #: [id number] Date of Birth: [birth year] Admit Type: Outpatient Age: 77 Room: [facility] ENDO ROOM 3 Gender: Male Note Status: Finalized Instrument Name: Prentice Docker [id number] Procedure:             Colonoscopy Indications:           Chronic diarrhea Providers:             [name] MD, MD Referring MD:          Dale Croswell, MD (Referring MD) Medicines:             Monitored Anesthesia Care Complications:         No immediate complications. Procedure:             Pre-Anesthesia Assessment:                        - Prior to the procedure, a History and Physical was                         performed, and patient medications, allergies and                         sensitivities were reviewed. The patient's tolerance                         of previous anesthesia was reviewed.                        - The risks and benefits of the procedure and the                         sedation options and risks were discussed with the                         patient. All questions were answered and informed                         consent was obtained.                        - ASA Grade Assessment: II - A patient with mild                         systemic disease.                        After obtaining informed consent, the colonoscope was                         passed under direct vision. Throughout the procedure,                         the patient's blood pressure, pulse, and oxygen                         saturations were monitored continuously. The                         Colonoscope was introduced through the anus  and                         advanced to the the cecum, identified by the                         appendiceal orifice. The colonoscopy was performed                         with ease. The patient tolerated the procedure well.                         The  quality of the bowel preparation was good. Findings:      showed severely excoriated skin in the sacral and inter gluteal folds      Two sessile polyps were found in the ascending colon. The polyps were 4       to 5 mm in size. These polyps were removed with a cold snare. Resection       and retrieval were complete.      Normal mucosa was found in the entire colon. Biopsies for histology were       taken with a cold forceps for evaluation of microscopic colitis.      The exam was otherwise without abnormality. Impression:            - Abnormal digital rectal exam.                        - Two 4 to 5 mm polyps in the ascending colon, removed                         with a cold snare. Resected and retrieved.                        - Normal mucosa in the entire examined colon. Biopsied.                        - The examination was otherwise normal. Recommendation:        - Discharge patient to home (with escort).                        - Resume previous diet.                        - Continue present medications.                        - Await pathology results.                        - Return to my office as previously scheduled.                        - Needs good wound care for sacral ulcers Procedure Code(s):     --- Professional ---                        [phone], Colonoscopy, flexible; with removal of  tumor(s), polyp(s), or other lesion(s) by snare                         technique                        45380, 59, Colonoscopy, flexible; with biopsy, single                         or multiple Diagnosis Code(s):     --- Professional ---                        K62.89, Other specified diseases of anus and rectum                        D12.2, Benign neoplasm of ascending colon                        K52.9, Noninfective gastroenteritis and colitis,                         unspecified CPT copyright 2022 American Medical Association. All rights reserved. The codes  documented in this report are preliminary and upon coder review may  be revised to meet current compliance requirements. [name], MD [name] MD, MD 02/04/2023 11:31:08 AM This report has been signed electronically. Number of Addenda: 0 Note Initiated On: 02/04/2023 10:58 AM Scope Withdrawal Time: 0 hours 7 minutes 58 seconds  Total Procedure Duration: 0 hours 13 minutes 28 seconds  Estimated Blood Loss:  Estimated blood loss: none.      [facility]

## 2023-02-04 NOTE — Transfer of Care (Signed)
Immediate Anesthesia Transfer of Care Note  Patient: ZED WANNINGER  Procedure(s) Performed: COLONOSCOPY WITH PROPOFOL  Patient Location: Endoscopy Unit  Anesthesia Type:General  Level of Consciousness: drowsy  Airway & Oxygen Therapy: Patient Spontanous Breathing  Post-op Assessment: Report given to RN and Post -op Vital signs reviewed and stable  Post vital signs: Reviewed and stable  Last Vitals:  Vitals Value Taken Time  BP 121/101 02/04/23 1134  Temp    Pulse 96 02/04/23 1136  Resp 24 02/04/23 1136  SpO2 96 % 02/04/23 1136  Vitals shown include unvalidated device data.  Last Pain:  Vitals:   02/04/23 1020  TempSrc: Temporal  PainSc: 0-No pain         Complications: No notable events documented.

## 2023-02-04 NOTE — Anesthesia Preprocedure Evaluation (Signed)
Anesthesia Evaluation  Patient identified by MRN, date of birth, ID band Patient awake    Reviewed: Allergy & Precautions, NPO status , Patient's Chart, lab work & pertinent test results  History of Anesthesia Complications Negative for: history of anesthetic complications  Airway Mallampati: III  TM Distance: <3 FB Neck ROM: full    Dental  (+) Missing   Pulmonary COPD, former smoker   Pulmonary exam normal        Cardiovascular hypertension, + CAD and +CHF  Normal cardiovascular exam     Neuro/Psych  PSYCHIATRIC DISORDERS       Neuromuscular disease    GI/Hepatic Neg liver ROS,GERD  ,,  Endo/Other  diabetesHypothyroidism    Renal/GU Renal disease  negative genitourinary   Musculoskeletal   Abdominal   Peds  Hematology negative hematology ROS (+)   Anesthesia Other Findings Past Medical History: No date: CHF (congestive heart failure) No date: Depression No date: 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 No date: Gastroparesis No date: Graves disease No date: Hypercholesterolemia No date: Hypertension 07/2014: Mantle cell lymphoma No date: Peripheral neuropathy No date: Shingles  Past Surgical History: No date: CHOLECYSTECTOMY 01/18/2023: ESOPHAGOGASTRODUODENOSCOPY (EGD) WITH PROPOFOL; N/A     Comment:  Procedure: ESOPHAGOGASTRODUODENOSCOPY (EGD) WITH               PROPOFOL;  Surgeon: Jaynie Collins, DO;  Location:              [facility] ENDOSCOPY;  Service: Gastroenterology;  Laterality:               N/A; No date: NEPHRECTOMY     Comment:  right 12/07/2017: PACEMAKER INSERTION; N/A     Comment:  Procedure: INSERTION PACEMAKER DUAL CHAMBER INITIAL               INSERT;  Surgeon: Marcina Millard, MD;  Location:               [facility] ORS;  Service: Cardiovascular;  Laterality: N/A; 11/03/2017: PORTA CATH INSERTION; N/A     Comment:  Procedure: PORTA  CATH INSERTION;  Surgeon: Renford Dills, MD;  Location: [facility] INVASIVE CV LAB;  Service:              Cardiovascular;  Laterality: N/A;     Reproductive/Obstetrics negative OB ROS                             Anesthesia Physical Anesthesia Plan  ASA: 3  Anesthesia Plan: General   Post-op Pain Management:    Induction: Intravenous  PONV Risk Score and Plan: Propofol infusion and TIVA  Airway Management Planned: Natural Airway and Nasal Cannula  Additional Equipment:   Intra-op Plan:   Post-operative Plan:   Informed Consent: I have reviewed the patients History and Physical, chart, labs and discussed the procedure including the risks, benefits and alternatives for the proposed anesthesia with the patient or authorized representative who has indicated his/her understanding and acceptance.     Dental Advisory Given  Plan Discussed with: Anesthesiologist, CRNA and Surgeon  Anesthesia Plan Comments: (Patient consented for risks of anesthesia including but not limited to:  - adverse reactions to medications - risk of airway placement if required - damage to eyes, teeth, lips or other oral mucosa - nerve damage due to positioning  -  sore throat or hoarseness - Damage to heart, brain, nerves, lungs, other parts of body or loss of life  Patient voiced understanding.)       Anesthesia Quick Evaluation

## 2023-02-04 NOTE — H&P (Signed)
[name], MD [address], [place], [place], [address] [address], [address], [place], [place], [address] Phone: [phone]  Fax: [phone]  Primary Care Physician:  Dale Revere, MD   Pre-Procedure History & Physical: HPI:  Gary [name] is a 77 y.o. male is here for an colonoscopy.   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    Gastroparesis    Graves disease    Hypercholesterolemia    Hypertension    Mantle cell lymphoma 07/2014   Peripheral neuropathy    Shingles     Past Surgical History:  Procedure Laterality Date   CHOLECYSTECTOMY     ESOPHAGOGASTRODUODENOSCOPY (EGD) WITH PROPOFOL N/A 01/18/2023   Procedure: ESOPHAGOGASTRODUODENOSCOPY (EGD) WITH PROPOFOL;  Surgeon: Jaynie Collins, DO;  Location: [facility];  Service: Gastroenterology;  Laterality: N/A;   NEPHRECTOMY     right   PACEMAKER INSERTION N/A 12/07/2017   Procedure: INSERTION PACEMAKER DUAL CHAMBER INITIAL INSERT;  Surgeon: Marcina Millard, MD;  Location: [facility] ORS;  Service: Cardiovascular;  Laterality: N/A;   PORTA CATH INSERTION N/A 11/03/2017   Procedure: PORTA CATH INSERTION;  Surgeon: Renford Dills, MD;  Location: [facility] INVASIVE CV LAB;  Service: Cardiovascular;  Laterality: N/A;    Prior to Admission medications   Medication Sig Start Date End Date Taking? Authorizing Provider  cholestyramine (QUESTRAN) 4 g packet Take 1 packet (4 g total) by mouth 2 (two) times daily. 01/25/23  Yes Delfino Lovett, MD  FLUoxetine (PROZAC) 20 MG tablet Take 1 tablet (20 mg total) by mouth daily. 11/13/22  Yes Dale Atlantis, MD  furosemide (LASIX) 40 MG tablet Take 2 tablets in the morning, and 1 tablet in the evening. 08/19/22  Yes Dale Friendship, MD  insulin aspart (NOVOLOG) 100 UNIT/ML injection Inject 6 Units into the skin 3 (three) times  daily with meals. 09/03/21  Yes Darlin Priestly, MD  levothyroxine (SYNTHROID) 50 MCG tablet Take 1 tablet (50 mcg total) by mouth daily before breakfast. 01/26/23  Yes Delfino Lovett, MD  midodrine (PROAMATINE) 10 MG tablet Take 1 tablet (10 mg total) by mouth 3 (three) times daily with meals. 01/25/23  Yes Delfino Lovett, MD  pantoprazole (PROTONIX) 40 MG tablet Take 1 tablet (40 mg total) by mouth 2 (two) times daily before a meal. 01/25/23  Yes Delfino Lovett, MD  potassium chloride SA (KLOR-CON M) 20 MEQ tablet Take 1 tablet (20 mEq total) by mouth 2 (two) times daily. 01/08/23  Yes Borders, Daryl Eastern, NP  pravastatin (PRAVACHOL) 40 MG tablet TAKE 1 TABLET BY MOUTH EVERY DAY 09/14/22  Yes Dale Dayton, MD  Cholecalciferol (VITAMIN D3) 50 MCG (2000 UT) CAPS Take 2,000 Units by mouth daily. 03/20/20   [provider]  feeding supplement, GLUCERNA SHAKE, (GLUCERNA SHAKE) LIQD Take 237 mLs by mouth 2 (two) times daily between meals. 09/13/21   Swayze, Ava, DO  ferrous sulfate 325 (65 FE) MG tablet Take 325 mg by mouth daily with breakfast.    [provider]  glucose 4 GM chewable tablet Chew 1 tablet (4 g total) by mouth once as needed for low blood sugar. Patient not taking: Reported on 02/02/2023 02/09/21   Dale [place], MD  Insulin Human (INSULIN PUMP) SOLN Inject into the skin.    [provider]  lactobacillus (FLORANEX/LACTINEX) PACK Take 1 packet (1 g  total) by mouth 3 (three) times daily with meals. 01/25/23   Delfino Lovett, MD  lidocaine-prilocaine (EMLA) cream Apply 1 Application topically once. 08/26/22   [provider]  loperamide (IMODIUM) 2 MG capsule Take 1 capsule (2 mg total) by mouth as needed for diarrhea or loose stools. 01/25/23   Delfino Lovett, MD  nystatin cream (MYCOSTATIN) APPLY TO AFFECTED AREA TWICE A DAY 11/30/22   Dale [place]bury, MD  vitamin B-12 (CYANOCOBALAMIN) 1000 MCG tablet Take 1,000 mcg by mouth daily.    [provider]    Allergies as of  01/28/2023 - Review Complete 01/18/2023  Allergen Reaction Noted   Rofecoxib Nausea Only 04/09/2015    Family History  Problem Relation Age of Onset   Breast cancer Mother    Diabetes Father    Cancer Father        Lymphoma   Alcohol abuse Maternal Uncle    Diabetes Sister    Heart disease Other        grandfather    Social History   Socioeconomic History   Marital status: Widowed    Spouse name: Not on file   Number of children: 1   Years of education: 8   Highest education level: 12th grade  Occupational History   Occupation: Retired  Tobacco Use   Smoking status: Former    Types: Cigarettes    Passive exposure: Past   Smokeless tobacco: Current    Types: Chew  Vaping Use   Vaping Use: Never used  Substance and Sexual Activity   Alcohol use: Yes    Alcohol/week: 3.0 standard drinks of alcohol    Types: 3 Cans of beer per week    Comment: nightly   Drug use: Never   Sexual activity: Not Currently  Other Topics Concern   Not on file  Social History Narrative   Not on file   Social Determinants of Health   Financial Resource Strain: Low Risk  (03/10/2022)   Overall Financial Resource Strain (CARDIA)    Difficulty of Paying Living Expenses: Not hard at all  Food Insecurity: No Food Insecurity (01/12/2023)   Hunger Vital Sign    Worried About Running Out of Food in the Last Year: Never true    Ran Out of Food in the Last Year: Never true  Transportation Needs: No Transportation Needs (01/12/2023)   PRAPARE - Administrator, Civil Service (Medical): No    Lack of Transportation (Non-Medical): No  Recent Concern: Transportation Needs - Unmet Transportation Needs (01/11/2023)   PRAPARE - Transportation    Lack of Transportation (Medical): Yes    Lack of Transportation (Non-Medical): Yes  Physical Activity: Inactive (03/10/2022)   Exercise Vital Sign    Days of Exercise per Week: 0 days    Minutes of Exercise per Session: 0 min  Stress: No Stress  Concern Present (03/10/2022)   [employer] of Occupational Health - Occupational Stress Questionnaire    Feeling of Stress : Only a little  Social Connections: Socially Isolated (03/10/2022)   Social Connection and Isolation Panel [NHANES]    Frequency of Communication with Friends and Family: More than three times a week    Frequency of Social Gatherings with Friends and Family: Twice a week    Attends Religious Services: Never    Database administrator or Organizations: No    Attends Banker Meetings: Never    Marital Status: Widowed  Intimate Partner Violence: Not At Risk (01/12/2023)  Humiliation, Afraid, Rape, and Kick questionnaire    Fear of Current or Ex-Partner: No    Emotionally Abused: No    Physically Abused: No    Sexually Abused: No    Review of Systems: See HPI, otherwise negative ROS  Physical Exam: There were no vitals taken for this visit. General:   Alert,  pleasant and cooperative in NAD Head:  Normocephalic and atraumatic. Neck:  Supple; no masses or thyromegaly. Lungs:  Clear throughout to auscultation, normal respiratory effort.    Heart:  +S1, +S2, Regular rate and rhythm, No edema. Abdomen:  Soft, nontender and nondistended. Normal bowel sounds, without guarding, and without rebound.   Neurologic:  Alert and  oriented x4;  grossly normal neurologically.  Impression/Plan: MARIN MILLEY is here for an colonoscopy to be performed for diarrhea Risks, benefits, limitations, and alternatives regarding  colonoscopy have been reviewed with the patient.  Questions have been answered.  All parties agreeable.   [name], MD  02/04/2023, 10:22 AM

## 2023-02-05 ENCOUNTER — Emergency Department: Payer: Medicare Other

## 2023-02-05 ENCOUNTER — Inpatient Hospital Stay
Admission: EM | Admit: 2023-02-05 | Discharge: 2023-02-17 | DRG: 552 | Disposition: A | Discharge status: Skilled Nursing Facility | Payer: Medicare Other | Source: Skilled Nursing Facility | Attending: Internal Medicine | Admitting: Internal Medicine

## 2023-02-05 ENCOUNTER — Inpatient Hospital Stay: Payer: 59 | Admitting: Oncology

## 2023-02-05 ENCOUNTER — Other Ambulatory Visit: Payer: 59

## 2023-02-05 ENCOUNTER — Encounter: Payer: Self-pay | Admitting: Gastroenterology

## 2023-02-05 DIAGNOSIS — K529 Noninfective gastroenteritis and colitis, unspecified: Secondary | ICD-10-CM | POA: Diagnosis present

## 2023-02-05 DIAGNOSIS — E039 Hypothyroidism, unspecified: Secondary | ICD-10-CM | POA: Diagnosis present

## 2023-02-05 DIAGNOSIS — Z7901 Long term (current) use of anticoagulants: Secondary | ICD-10-CM

## 2023-02-05 DIAGNOSIS — R296 Repeated falls: Secondary | ICD-10-CM

## 2023-02-05 DIAGNOSIS — D61818 Other pancytopenia: Secondary | ICD-10-CM | POA: Diagnosis present

## 2023-02-05 DIAGNOSIS — S0101XA Laceration without foreign body of scalp, initial encounter: Secondary | ICD-10-CM | POA: Diagnosis present

## 2023-02-05 DIAGNOSIS — Z794 Long term (current) use of insulin: Secondary | ICD-10-CM

## 2023-02-05 DIAGNOSIS — C8310 Mantle cell lymphoma, unspecified site: Secondary | ICD-10-CM | POA: Diagnosis present

## 2023-02-05 DIAGNOSIS — I110 Hypertensive heart disease with heart failure: Secondary | ICD-10-CM | POA: Diagnosis present

## 2023-02-05 DIAGNOSIS — K649 Unspecified hemorrhoids: Secondary | ICD-10-CM | POA: Diagnosis present

## 2023-02-05 DIAGNOSIS — Z9181 History of falling: Secondary | ICD-10-CM

## 2023-02-05 DIAGNOSIS — E1043 Type 1 diabetes mellitus with diabetic autonomic (poly)neuropathy: Secondary | ICD-10-CM | POA: Diagnosis present

## 2023-02-05 DIAGNOSIS — D638 Anemia in other chronic diseases classified elsewhere: Secondary | ICD-10-CM | POA: Diagnosis present

## 2023-02-05 DIAGNOSIS — E1042 Type 1 diabetes mellitus with diabetic polyneuropathy: Secondary | ICD-10-CM | POA: Diagnosis present

## 2023-02-05 DIAGNOSIS — Z905 Acquired absence of kidney: Secondary | ICD-10-CM

## 2023-02-05 DIAGNOSIS — Z79899 Other long term (current) drug therapy: Secondary | ICD-10-CM

## 2023-02-05 DIAGNOSIS — Z9221 Personal history of antineoplastic chemotherapy: Secondary | ICD-10-CM

## 2023-02-05 DIAGNOSIS — R112 Nausea with vomiting, unspecified: Secondary | ICD-10-CM | POA: Diagnosis not present

## 2023-02-05 DIAGNOSIS — S12601A Unspecified nondisplaced fracture of seventh cervical vertebra, initial encounter for closed fracture: Secondary | ICD-10-CM | POA: Diagnosis not present

## 2023-02-05 DIAGNOSIS — W01198A Fall on same level from slipping, tripping and stumbling with subsequent striking against other object, initial encounter: Secondary | ICD-10-CM | POA: Diagnosis not present

## 2023-02-05 DIAGNOSIS — E7800 Pure hypercholesterolemia, unspecified: Secondary | ICD-10-CM | POA: Diagnosis present

## 2023-02-05 DIAGNOSIS — G8929 Other chronic pain: Secondary | ICD-10-CM | POA: Diagnosis present

## 2023-02-05 DIAGNOSIS — K3184 Gastroparesis: Secondary | ICD-10-CM | POA: Diagnosis present

## 2023-02-05 DIAGNOSIS — Z833 Family history of diabetes mellitus: Secondary | ICD-10-CM

## 2023-02-05 DIAGNOSIS — S12691A Other nondisplaced fracture of seventh cervical vertebra, initial encounter for closed fracture: Principal | ICD-10-CM | POA: Diagnosis present

## 2023-02-05 DIAGNOSIS — M79604 Pain in right leg: Secondary | ICD-10-CM | POA: Diagnosis present

## 2023-02-05 DIAGNOSIS — Y92009 Unspecified place in unspecified non-institutional (private) residence as the place of occurrence of the external cause: Secondary | ICD-10-CM

## 2023-02-05 DIAGNOSIS — I509 Heart failure, unspecified: Secondary | ICD-10-CM | POA: Diagnosis present

## 2023-02-05 DIAGNOSIS — E0500 Thyrotoxicosis with diffuse goiter without thyrotoxic crisis or storm: Secondary | ICD-10-CM | POA: Diagnosis present

## 2023-02-05 DIAGNOSIS — W19XXXA Unspecified fall, initial encounter: Secondary | ICD-10-CM | POA: Diagnosis present

## 2023-02-05 DIAGNOSIS — L89159 Pressure ulcer of sacral region, unspecified stage: Secondary | ICD-10-CM | POA: Diagnosis not present

## 2023-02-05 DIAGNOSIS — F32A Depression, unspecified: Secondary | ICD-10-CM | POA: Diagnosis present

## 2023-02-05 DIAGNOSIS — E1065 Type 1 diabetes mellitus with hyperglycemia: Secondary | ICD-10-CM | POA: Diagnosis present

## 2023-02-05 DIAGNOSIS — W0110XA Fall on same level from slipping, tripping and stumbling with subsequent striking against unspecified object, initial encounter: Secondary | ICD-10-CM | POA: Diagnosis present

## 2023-02-05 DIAGNOSIS — Z87891 Personal history of nicotine dependence: Secondary | ICD-10-CM

## 2023-02-05 DIAGNOSIS — E109 Type 1 diabetes mellitus without complications: Secondary | ICD-10-CM | POA: Diagnosis not present

## 2023-02-05 DIAGNOSIS — L89322 Pressure ulcer of left buttock, stage 2: Secondary | ICD-10-CM | POA: Diagnosis present

## 2023-02-05 DIAGNOSIS — S0990XA Unspecified injury of head, initial encounter: Secondary | ICD-10-CM | POA: Diagnosis not present

## 2023-02-05 DIAGNOSIS — Z634 Disappearance and death of family member: Secondary | ICD-10-CM

## 2023-02-05 DIAGNOSIS — Z66 Do not resuscitate: Secondary | ICD-10-CM | POA: Diagnosis not present

## 2023-02-05 DIAGNOSIS — Y92129 Unspecified place in nursing home as the place of occurrence of the external cause: Secondary | ICD-10-CM

## 2023-02-05 DIAGNOSIS — Z8619 Personal history of other infectious and parasitic diseases: Secondary | ICD-10-CM

## 2023-02-05 DIAGNOSIS — Z886 Allergy status to analgesic agent status: Secondary | ICD-10-CM

## 2023-02-05 DIAGNOSIS — I951 Orthostatic hypotension: Secondary | ICD-10-CM | POA: Diagnosis present

## 2023-02-05 DIAGNOSIS — N390 Urinary tract infection, site not specified: Secondary | ICD-10-CM | POA: Diagnosis present

## 2023-02-05 DIAGNOSIS — R197 Diarrhea, unspecified: Secondary | ICD-10-CM

## 2023-02-05 DIAGNOSIS — Y9301 Activity, walking, marching and hiking: Secondary | ICD-10-CM | POA: Diagnosis present

## 2023-02-05 DIAGNOSIS — Z86010 Personal history of colonic polyps: Secondary | ICD-10-CM

## 2023-02-05 DIAGNOSIS — Z79890 Hormone replacement therapy (postmenopausal): Secondary | ICD-10-CM

## 2023-02-05 DIAGNOSIS — L89156 Pressure-induced deep tissue damage of sacral region: Secondary | ICD-10-CM | POA: Diagnosis present

## 2023-02-05 DIAGNOSIS — D630 Anemia in neoplastic disease: Secondary | ICD-10-CM | POA: Diagnosis present

## 2023-02-05 DIAGNOSIS — Z5982 Transportation insecurity: Secondary | ICD-10-CM

## 2023-02-05 DIAGNOSIS — M79605 Pain in left leg: Secondary | ICD-10-CM | POA: Diagnosis present

## 2023-02-05 DIAGNOSIS — Z9049 Acquired absence of other specified parts of digestive tract: Secondary | ICD-10-CM

## 2023-02-05 DIAGNOSIS — L89312 Pressure ulcer of right buttock, stage 2: Secondary | ICD-10-CM | POA: Diagnosis present

## 2023-02-05 DIAGNOSIS — E538 Deficiency of other specified B group vitamins: Secondary | ICD-10-CM | POA: Diagnosis present

## 2023-02-05 DIAGNOSIS — E10311 Type 1 diabetes mellitus with unspecified diabetic retinopathy with macular edema: Secondary | ICD-10-CM | POA: Diagnosis present

## 2023-02-05 DIAGNOSIS — D122 Benign neoplasm of ascending colon: Secondary | ICD-10-CM | POA: Diagnosis present

## 2023-02-05 LAB — COMPREHENSIVE METABOLIC PANEL
ALT: 12 U/L (ref 0–44)
AST: 25 U/L (ref 15–41)
Albumin: 2.5 g/dL — ABNORMAL LOW (ref 3.5–5.0)
Alkaline Phosphatase: 103 U/L (ref 38–126)
Anion gap: 7 (ref 5–15)
BUN: 14 mg/dL (ref 8–23)
CO2: 31 mmol/L (ref 22–32)
Calcium: 7.4 mg/dL — ABNORMAL LOW (ref 8.9–10.3)
Chloride: 98 mmol/L (ref 98–111)
Creatinine, Ser: 1.12 mg/dL (ref 0.61–1.24)
GFR, Estimated: >60 mL/min (ref >60)
Glucose, Bld: 155 mg/dL — ABNORMAL HIGH (ref 70–99)
Potassium: 4.2 mmol/L (ref 3.5–5.1)
Sodium: 136 mmol/L (ref 135–145)
Total Bilirubin: 1 mg/dL (ref 0.3–1.2)
Total Protein: 5 g/dL — ABNORMAL LOW (ref 6.5–8.1)

## 2023-02-05 LAB — URINALYSIS, ROUTINE W REFLEX MICROSCOPIC
Bilirubin Urine: NEGATIVE
Glucose, UA: NEGATIVE mg/dL
Ketones, ur: NEGATIVE mg/dL
Nitrite: NEGATIVE
Protein, ur: NEGATIVE mg/dL
Specific Gravity, Urine: 1.01 (ref 1.005–1.030)
pH: 5 (ref 5.0–8.0)

## 2023-02-05 LAB — CBC WITH DIFFERENTIAL/PLATELET
Abs Immature Granulocytes: 0.12 10*3/uL — ABNORMAL HIGH (ref 0.00–0.07)
Basophils Absolute: 0 10*3/uL (ref 0.0–0.1)
Basophils Relative: 1 %
Eosinophils Absolute: 0.1 10*3/uL (ref 0.0–0.5)
Eosinophils Relative: 6 %
HCT: 31.3 % — ABNORMAL LOW (ref 39.0–52.0)
Hemoglobin: 9.7 g/dL — ABNORMAL LOW (ref 13.0–17.0)
Immature Granulocytes: 5 %
Lymphocytes Relative: 42 %
Lymphs Abs: 1 10*3/uL (ref 0.7–4.0)
MCH: 29.4 pg (ref 26.0–34.0)
MCHC: 31 g/dL (ref 30.0–36.0)
MCV: 94.8 fL (ref 80.0–100.0)
Monocytes Absolute: 0.6 10*3/uL (ref 0.1–1.0)
Monocytes Relative: 26 %
Neutro Abs: 0.5 10*3/uL — ABNORMAL LOW (ref 1.7–7.7)
Neutrophils Relative %: 20 %
Platelets: 110 10*3/uL — ABNORMAL LOW (ref 150–400)
RBC: 3.3 MIL/uL — ABNORMAL LOW (ref 4.22–5.81)
RDW: 15.4 % (ref 11.5–15.5)
WBC: 2.4 10*3/uL — ABNORMAL LOW (ref 4.0–10.5)
nRBC: 0 % (ref 0.0–0.2)

## 2023-02-05 LAB — APTT: aPTT: 63 seconds — ABNORMAL HIGH (ref 24–36)

## 2023-02-05 LAB — CBG MONITORING, ED: Glucose-Capillary: 121 mg/dL — ABNORMAL HIGH (ref 70–99)

## 2023-02-05 LAB — PROTIME-INR
INR: 1.4 — ABNORMAL HIGH (ref 0.8–1.2)
Prothrombin Time: 17.4 seconds — ABNORMAL HIGH (ref 11.4–15.2)

## 2023-02-05 LAB — SURGICAL PATHOLOGY

## 2023-02-05 MED ORDER — MORPHINE SULFATE (PF) 2 MG/ML IV SOLN
2.0000 mg | INTRAVENOUS | Status: DC | PRN
  Administered 2023-02-06 – 2023-02-11 (×9): 2 mg via INTRAVENOUS
  Filled 2023-02-05 (×9): qty 1

## 2023-02-05 MED ORDER — LIDOCAINE HCL (PF) 1 % IJ SOLN
5.0000 mL | Freq: Once | INTRAMUSCULAR | Status: AC
  Administered 2023-02-05: 5 mL via INTRADERMAL
  Filled 2023-02-05: qty 5

## 2023-02-05 MED ORDER — TRAMADOL HCL 50 MG PO TABS
50.0000 mg | ORAL_TABLET | Freq: Once | ORAL | Status: AC
  Administered 2023-02-05: 50 mg via ORAL
  Filled 2023-02-05: qty 1

## 2023-02-05 MED ORDER — ACETAMINOPHEN 325 MG PO TABS
650.0000 mg | ORAL_TABLET | Freq: Once | ORAL | Status: AC
  Administered 2023-02-05: 650 mg via ORAL
  Filled 2023-02-05: qty 2

## 2023-02-05 MED ORDER — IPRATROPIUM-ALBUTEROL 0.5-2.5 (3) MG/3ML IN SOLN
3.0000 mL | Freq: Once | RESPIRATORY_TRACT | Status: AC
  Administered 2023-02-06: 3 mL via RESPIRATORY_TRACT
  Filled 2023-02-05: qty 3

## 2023-02-05 MED ORDER — LIDOCAINE-EPINEPHRINE-TETRACAINE (LET) TOPICAL GEL
3.0000 mL | Freq: Once | TOPICAL | Status: AC
  Administered 2023-02-05: 3 mL via TOPICAL
  Filled 2023-02-05: qty 3

## 2023-02-05 NOTE — ED Provider Notes (Signed)
[facility] Provider Note    Event Date/Time   First MD Initiated Contact with Patient 02/05/23 1854     (approximate)   History   Fall   HPI  Gary [name] is a 77 y.o. male senting from [facility] with an unwitnessed fall.  Patient states that he stumbled after his legs gave out which they commonly due to chronic bilateral leg pain.  Larey Seat backwards hitting his head.  Does complain of some neck pain as well denies any numbness or tingling.  No LOC.  He is on anticoagulation.  Denies any chest pain or shortness of breath.     Physical Exam   Triage Vital Signs: ED Triage Vitals  Enc Vitals Group     BP 02/05/23 1900 (!) 121/58     Pulse Rate 02/05/23 1900 71     Resp 02/05/23 1904 18     Temp 02/05/23 1904 98.6 F (37 C)     Temp Source 02/05/23 1904 Oral     SpO2 02/05/23 1900 100 %     Weight 02/05/23 1905 212 lb 15.4 oz (96.6 kg)     Height 02/05/23 1905 5\' 9"  (1.753 m)     Head Circumference --      Peak Flow --      Pain Score 02/05/23 1905 4     Pain Loc --      Pain Edu? --      Excl. in GC? --     Most recent vital signs: Vitals:   02/05/23 1930 02/05/23 2245  BP: 129/66 119/60  Pulse: 85 94  Resp:  (!) 24  Temp:    SpO2: 100% 100%     Constitutional: Alert  Eyes: Conjunctivae are normal.  Head: Atraumatic. Nose: No congestion/rhinnorhea. Mouth/Throat: Mucous membranes are moist.   Neck: Painless ROM.  Cardiovascular:   Good peripheral circulation. Respiratory: Normal respiratory effort.  No retractions.  Gastrointestinal: Soft and nontender.  Musculoskeletal:  no deformity Neurologic:  MAE spontaneously.  Strength intact.  Snesation intact, No gross focal neurologic deficits are appreciated. cni Skin:  Skin is warm, dry and intact. No rash noted. Psychiatric: Mood and affect are normal. Speech and behavior are normal.    ED Results / Procedures / Treatments   Labs (all labs ordered are listed, but only  abnormal results are displayed) Labs Reviewed  PROTIME-INR - Abnormal; Notable for the following components:      Result Value   Prothrombin Time 17.4 (*)    INR 1.4 (*)    All other components within normal limits  APTT - Abnormal; Notable for the following components:   aPTT 63 (*)    All other components within normal limits  CBC WITH DIFFERENTIAL/PLATELET - Abnormal; Notable for the following components:   WBC 2.4 (*)    RBC 3.30 (*)    Hemoglobin 9.7 (*)    HCT 31.3 (*)    Platelets 110 (*)    Neutro Abs 0.5 (*)    Abs Immature Granulocytes 0.12 (*)    All other components within normal limits  COMPREHENSIVE METABOLIC PANEL - Abnormal; Notable for the following components:   Glucose, Bld 155 (*)    Calcium 7.4 (*)    Total Protein 5.0 (*)    Albumin 2.5 (*)    All other components within normal limits  URINALYSIS, ROUTINE W REFLEX MICROSCOPIC - Abnormal; Notable for the following components:   Color, Urine YELLOW (*)  APPearance CLEAR (*)    Hgb urine dipstick SMALL (*)    Leukocytes,Ua MODERATE (*)    Bacteria, UA RARE (*)    All other components within normal limits  CBG MONITORING, ED - Abnormal; Notable for the following components:   Glucose-Capillary 121 (*)    All other components within normal limits     EKG  ED ECG REPORT I, Willy Eddy, the attending physician, personally viewed and interpreted this ECG.   Date: 02/05/2023  EKG Time: 20:17  Rate: 80  Rhythm: vpaced  Axis: left  Intervals:paced  ST&T Change: no sgarbossa    RADIOLOGY Please see ED Course for my review and interpretation.  I personally reviewed all radiographic images ordered to evaluate for the above acute complaints and reviewed radiology reports and findings.  These findings were personally discussed with the patient.  Please see medical record for radiology report.    PROCEDURES:  Critical Care performed: No  ..Laceration Repair  Date/Time: 02/05/2023 11:04  PM  Performed by: Willy Eddy, MD Authorized by: Willy Eddy, MD   Consent:    Consent obtained:  Verbal Anesthesia:    Anesthesia method:  Topical application and local infiltration   Topical anesthetic:  LET   Local anesthetic:  Lidocaine 1% w/o epi Laceration details:    Location:  Scalp   Scalp location:  R parietal   Length (cm):  2 Treatment:    Area cleansed with:  Chlorhexidine Skin repair:    Repair method:  Staples   Number of staples:  4 Approximation:    Approximation:  Close Repair type:    Repair type:  Simple    MEDICATIONS ORDERED IN ED: Medications  ipratropium-albuterol (DUONEB) 0.5-2.5 (3) MG/3ML nebulizer solution 3 mL (has no administration in time range)  morphine (PF) 2 MG/ML injection 2 mg (has no administration in time range)  lidocaine-EPINEPHrine-tetracaine (LET) topical gel (3 mLs Topical Given 02/05/23 2103)  lidocaine (PF) (XYLOCAINE) 1 % injection 5 mL (5 mLs Intradermal Given 02/05/23 2144)  acetaminophen (TYLENOL) tablet 650 mg (650 mg Oral Given 02/05/23 2202)  traMADol (ULTRAM) tablet 50 mg (50 mg Oral Given 02/05/23 2203)     IMPRESSION / MDM / ASSESSMENT AND PLAN / ED COURSE  I reviewed the triage vital signs and the nursing notes.                              Differential diagnosis includes, but is not limited to, fracture, contusion, dislocation, anemia, electrolyte abnormality  Patient presenting to the ER for evaluation of symptoms as described above.  Based on symptoms, risk factors and considered above differential, this presenting complaint could reflect a potentially life-threatening illness therefore the patient will be placed on continuous pulse oximetry and telemetry for monitoring.  Laboratory evaluation will be sent to evaluate for the above complaints.  CT imaging will be ordered for the blood differential.   Clinical Course as of 02/05/23 2350  Fri Feb 05, 2023  2012 CT head on my review and interpretation  without evidence of IPH. [PR]  2051 Case discussed in consultation with Dr. Madaline Brilliant of neurosurgery who agrees with plan for cervical collar placement given findings on CT and outpatient follow-up. [PR]  2108 After further discussion with neurosurgery will order MRI to ensure there is no posterior component to the injury. [PR]  2134 Will not be able to obtain MRI here this evening due to the patient's implanted cardiac  device.  In discussion with neurosurgery the cervical spine fracture will be okay for outpatient follow-up.  Blood work otherwise appears at baseline.  Will check ua. [PR]  2327 Patient Sister now at bedside reports that this is now the third fall in 2 weeks.  Has noted significant decline in his status despite being at rehab.  Patient states that when he gets up with walker after few steps starts getting weak and short of breath and falls.  May have a component of worsening emphysema but is not hypoxic currently.  Urinalysis with possible infection though he denies any dysuria.  No leukocytosis.  Anemia baseline.  Given his increasing falls now with cervical spine fracture as well as scalp laceration on anticoagulation disposition back to facility is concerning.  I will consult hospitalist for admission for pain control as well as physical therapy and further evaluation management. [PR]    Clinical Course User Index [PR] Willy Eddy, MD     FINAL CLINICAL IMPRESSION(S) / ED DIAGNOSES   Final diagnoses:  Other closed nondisplaced fracture of seventh cervical vertebra, initial encounter (HCC)  Scalp laceration, initial encounter  Frequent falls     Rx / DC Orders   ED Discharge Orders     None        Note:  This document was prepared using Dragon voice recognition software and may include unintentional dictation errors.    Willy Eddy, MD 02/05/23 2350

## 2023-02-05 NOTE — Progress Notes (Signed)
Neurosurgery brief note Was contacted by the emergency regarding this patient.  77 year old male status post unwitnessed fall from standing with head strike.  Had a small laceration to his posterior scalp CT scan negative of the head.  Neurologically intact per the emergency room He underwent CT scan imaging of the cervical spine which demonstrated the following   IMPRESSION: 1. Distraction injury at C6-C7, with fracture through the right anterior superior C7 endplate and widening of the anterior aspect of the disc space at C6-C7. Prevertebral fluid anterior to C4-T2. 2. No acute intracranial process. 3. Small right pleural effusion.   These findings were discussed by telephone on 02/05/2023 at 8:21 pm with provider Ferdie Bakken ROBINSON .     Electronically Signed   By: Wiliam Ke M.D.   On: 02/05/2023 20:24   AP: I would place the patient in a hard cervical collar when out of bed.  I have asked for an MRI of the cervical spine to better evaluate the injury around this area.  Likely the patient will just need cervical collar and follow-up in clinic but will be pending MRI cervical spine results.  Peter Garter. Madaline Brilliant, MD Neurosurgery

## 2023-02-05 NOTE — H&P (Signed)
History and Physical    Gary [name] ZOX:[id number] DOB: [birth year] DOA: 02/05/2023  PCP: Dale Hayti, MD  Patient coming from: fall Gary [name]  I have personally briefly reviewed patient's old medical records in [facility]  Chief Complaint: fall  HPI: Gary [name] is a 77 y.o. male with medical history significant of  recurrent stage III mantle cell lymphoma s/p 43 cycles of Rituxan and Treanda, depression, insulin-dependent diabetes mellitus, hypertension, hyperlipidemia, who presents to ED with increase falls over the last week at [facility]. Patient of note has interim history of admission 01/12/2023-01/25/23 at which time patient was treated for  Sepsis due to cellulitis, as well as AKI. Patient at that time had presented to ED with generalized weakness and hypotension.  Patient also has interim history of colonoscopy on 02/04/23 with results noting polyp descending colon/hemorrhoids otherwise normal :Patient currently states he was attempting to go to the bathroom and his leg gave out on him and he was not able to use his arms to hold himself up due to being weak. Sister also aides in history she is concerned about current rehab has has had 3 falls since being admitted on 4/15. She is also concerned about his lack of appetite. She also note him picking at the air when he sleeps which is new for him and she is concerned about medication side-effect. Patient notes no fever/chills/, but does have nausea , no current abdominal pain or chest pain.  ED Course:  Afeb, bp 121/58, HR78, rr 18 sat 100%  UA : LE Moderate, bacteria rare CTH/Cervical spine IMPRESSION: 1. Distraction injury at C6-C7, with fracture through the right anterior superior C7 endplate and widening of the anterior aspect of the disc space at C6-C7. Prevertebral fluid anterior to C4-T2. 2. No acute intracranial process. 3. Small right pleural effusion.   Cxr: IMPRESSION: Chronic interstitial changes consistent with fibrosis.  No evidence of active pulmonary disease.  Wbc 2.4, hgb 9.7 at baseline,plt 110 Inr 1.4  NA 136, K 4.2, glu 155,  Xray pelvis IMPRESSION: Osteopenia. No displaced fractures are visualized. If there is high clinical concern for hip fracture recommend dedicated hip views.  EKG ventricular paced    UA + wbc , LE  Tx tylenol, ultram  Review of Systems: As per HPI otherwise 10 point review of systems negative.   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    Gastroparesis    Graves disease    Hypercholesterolemia    Hypertension    Mantle cell lymphoma (HCC) 07/2014   Peripheral neuropathy    Shingles     Past Surgical History:  Procedure Laterality Date   CHOLECYSTECTOMY     COLONOSCOPY WITH PROPOFOL N/A 02/04/2023   Procedure: COLONOSCOPY WITH PROPOFOL;  Surgeon: [name], MD;  Location: [facility] ENDOSCOPY;  Service: Gastroenterology;  Laterality: N/A;   ESOPHAGOGASTRODUODENOSCOPY (EGD) WITH PROPOFOL N/A 01/18/2023   Procedure: ESOPHAGOGASTRODUODENOSCOPY (EGD) WITH PROPOFOL;  Surgeon: Jaynie Collins, DO;  Location: [facility] ENDOSCOPY;  Service: Gastroenterology;  Laterality: N/A;   NEPHRECTOMY     right   PACEMAKER INSERTION N/A 12/07/2017   Procedure: INSERTION PACEMAKER DUAL CHAMBER INITIAL INSERT;  Surgeon: Marcina Millard, MD;  Location: [facility] ORS;  Service: Cardiovascular;  Laterality: N/A;   PORTA CATH INSERTION N/A 11/03/2017   Procedure: PORTA CATH INSERTION;  Surgeon: Renford Dills, MD;  Location: [facility] INVASIVE CV LAB;  Service: Cardiovascular;  Laterality: N/A;     reports that he has quit smoking. His smoking use included cigarettes. He has been exposed to tobacco smoke. His smokeless tobacco use includes chew. He reports current alcohol use of about 3.0 standard drinks of alcohol per week. He reports that he does not  use drugs.  Allergies  Allergen Reactions   Rofecoxib Nausea Only    Family History  Problem Relation Age of Onset   Breast cancer Mother    Diabetes Father    Cancer Father        Lymphoma   Alcohol abuse Maternal Uncle    Diabetes Sister    Heart disease Other        grandfather    Prior to Admission medications   Medication Sig Start Date End Date Taking? Authorizing Provider  Cholecalciferol (VITAMIN D3) 50 MCG (2000 UT) CAPS Take 2,000 Units by mouth daily. 03/20/20   [provider]  cholestyramine (QUESTRAN) 4 g packet Take 1 packet (4 g total) by mouth 2 (two) times daily. 01/25/23   Delfino Lovett, MD  feeding supplement, GLUCERNA SHAKE, (GLUCERNA SHAKE) LIQD Take 237 mLs by mouth 2 (two) times daily between meals. 09/13/21   Swayze, Ava, DO  ferrous sulfate 325 (65 FE) MG tablet Take 325 mg by mouth daily with breakfast.    [provider]  FLUoxetine (PROZAC) 20 MG tablet Take 1 tablet (20 mg total) by mouth daily. 11/13/22   Dale Soledad, MD  furosemide (LASIX) 40 MG tablet Take 2 tablets in the morning, and 1 tablet in the evening. 08/19/22   Dale Dante, MD  glucose 4 GM chewable tablet Chew 1 tablet (4 g total) by mouth once as needed for low blood sugar. Patient not taking: Reported on 02/02/2023 02/09/21   Dale [place], MD  insulin aspart (NOVOLOG) 100 UNIT/ML injection Inject 6 Units into the skin 3 (three) times daily with meals. 09/03/21   Darlin Priestly, MD  Insulin Human (INSULIN PUMP) SOLN Inject into the skin.    [provider]  lactobacillus (FLORANEX/LACTINEX) PACK Take 1 packet (1 g total) by mouth 3 (three) times daily with meals. 01/25/23   Delfino Lovett, MD  levothyroxine (SYNTHROID) 50 MCG tablet Take 1 tablet (50 mcg total) by mouth daily before breakfast. 01/26/23   Delfino Lovett, MD  lidocaine-prilocaine (EMLA) cream Apply 1 Application topically once. 08/26/22   [provider]  loperamide (IMODIUM) 2 MG capsule Take 1  capsule (2 mg total) by mouth as needed for diarrhea or loose stools. 01/25/23   Delfino Lovett, MD  midodrine (PROAMATINE) 10 MG tablet Take 1 tablet (10 mg total) by mouth 3 (three) times daily with meals. 01/25/23   Delfino Lovett, MD  nystatin cream (MYCOSTATIN) APPLY TO AFFECTED AREA TWICE A DAY 11/30/22   Dale Rodriguez Hevia, MD  pantoprazole (PROTONIX) 40 MG tablet Take 1 tablet (40 mg total) by mouth 2 (two) times daily before a meal. 01/25/23   Delfino Lovett, MD  potassium chloride SA (KLOR-CON M) 20 MEQ tablet Take 1 tablet (20 mEq total) by mouth 2 (two) times daily. 01/08/23   Borders, Daryl Eastern, NP  pravastatin (PRAVACHOL) 40 MG tablet TAKE 1 TABLET BY MOUTH EVERY DAY 09/14/22   Dale Winfield, MD  vitamin B-12 (CYANOCOBALAMIN) 1000 MCG tablet Take 1,000 mcg by mouth daily.    [provider]    Physical Exam: Vitals:   02/05/23 1904 02/05/23 1905 02/05/23 1930  02/05/23 2245  BP: (!) 121/58  129/66 119/60  Pulse: 69  85 94  Resp: 18   (!) 24  Temp: 98.6 F (37 C)     TempSrc: Oral     SpO2: 100%  100% 100%  Weight:  96.6 kg    Height:  5\' 9"  (1.753 m)      Constitutional: NAD, calm, comfortable Vitals:   02/05/23 1904 02/05/23 1905 02/05/23 1930 02/05/23 2245  BP: (!) 121/58  129/66 119/60  Pulse: 69  85 94  Resp: 18   (!) 24  Temp: 98.6 F (37 C)     TempSrc: Oral     SpO2: 100%  100% 100%  Weight:  96.6 kg    Height:  5\' 9"  (1.753 m)     Eyes: PERRL, lids and conjunctivae normal ENMT: Mucous membranes are moist. Posterior pharynx clear of any exudate or lesions  Neck: normal, supple, no masses, no thyromegaly Respiratory: clear to auscultation bilaterally, no wheezing, no crackles. Normal respiratory effort. No accessory muscle use.  Cardiovascular: Regular rate and rhythm, no murmurs / rubs / gallops. No extremity edema. 2+ pedal pulses. N Abdomen: no tenderness, no masses palpated. No hepatosplenomegaly. Bowel sounds positive.  Musculoskeletal: no clubbing /  cyanosis. No joint deformity upper and lower extremities. Good ROM, no contractures..  Skin: no rashes, lesions, ulcers. No induration Neurologic: CN 2-12 grossly intact. Sensation intact, Strength 4+/5 in all 4.  Psychiatric: Normal judgment and insight. Alert and oriented x 3. Normal mood.    Labs on Admission: I have personally reviewed following labs and imaging studies  CBC: Recent Labs  Lab 02/02/23 1053 02/05/23 2053  WBC 3.0* 2.4*  NEUTROABS 1.2* 0.5*  HGB 9.9* 9.7*  HCT 31.3* 31.3*  MCV 92.9 94.8  PLT 120* 110*   Basic Metabolic Panel: Recent Labs  Lab 02/02/23 1053 02/05/23 2053  NA 133* 136  K 4.4 4.2  CL 97* 98  CO2 26 31  GLUCOSE 93 155*  BUN 20 14  CREATININE 1.84* 1.12  CALCIUM 7.8* 7.4*   GFR: Estimated Creatinine Clearance: 66.4 mL/min (by C-G formula based on SCr of 1.12 mg/dL). Liver Function Tests: Recent Labs  Lab 02/02/23 1053 02/05/23 2053  AST 27 25  ALT 11 12  ALKPHOS 101 103  BILITOT 0.6 1.0  PROT 5.2* 5.0*  ALBUMIN 2.4* 2.5*   No results for input(s): "LIPASE", "AMYLASE" in the last 168 hours. No results for input(s): "AMMONIA" in the last 168 hours. Coagulation Profile: Recent Labs  Lab 02/05/23 2053  INR 1.4*   Cardiac Enzymes: No results for input(s): "CKTOTAL", "CKMB", "CKMBINDEX", "TROPONINI" in the last 168 hours. BNP (last 3 results) No results for input(s): "PROBNP" in the last 8760 hours. HbA1C: No results for input(s): "HGBA1C" in the last 72 hours. CBG: Recent Labs  Lab 02/04/23 1042 02/05/23 2049  GLUCAP 218* 121*   Lipid Profile: No results for input(s): "CHOL", "HDL", "LDLCALC", "TRIG", "CHOLHDL", "LDLDIRECT" in the last 72 hours. Thyroid Function Tests: No results for input(s): "TSH", "T4TOTAL", "FREET4", "T3FREE", "THYROIDAB" in the last 72 hours. Anemia Panel: No results for input(s): "VITAMINB12", "FOLATE", "FERRITIN", "TIBC", "IRON", "RETICCTPCT" in the last 72 hours. Urine analysis:     Component Value Date/Time   COLORURINE YELLOW (A) 02/05/2023 2242   APPEARANCEUR CLEAR (A) 02/05/2023 2242   APPEARANCEUR Cloudy 06/13/2014 0823   LABSPEC 1.010 02/05/2023 2242   LABSPEC 1.018 06/13/2014 0823   PHURINE 5.0 02/05/2023 2242   GLUCOSEU NEGATIVE 02/05/2023  2242   GLUCOSEU 50 mg/dL 09/81/1914 7829   HGBUR SMALL (A) 02/05/2023 2242   BILIRUBINUR NEGATIVE 02/05/2023 2242   BILIRUBINUR Negative 06/13/2014 0823   KETONESUR NEGATIVE 02/05/2023 2242   PROTEINUR NEGATIVE 02/05/2023 2242   NITRITE NEGATIVE 02/05/2023 2242   LEUKOCYTESUR MODERATE (A) 02/05/2023 2242   LEUKOCYTESUR 2+ 06/13/2014 0823    Radiological Exams on Admission: DG Pelvis 1-2 Views  Result Date: 02/05/2023 CLINICAL DATA:  Fall EXAM: PELVIS - 1-2 VIEW COMPARISON:  None Available. FINDINGS: The bones are osteopenic. No displaced fractures are visualized. The joint spaces are maintained. Are vascular calcifications in the soft tissues. IMPRESSION: Osteopenia. No displaced fractures are visualized. If there is high clinical concern for hip fracture recommend dedicated hip views. Electronically Signed   By: Darliss Cheney M.D.   On: 02/05/2023 22:41   DG Chest Portable 1 View  Result Date: 02/05/2023 CLINICAL DATA:  Unwitnessed fall. EXAM: PORTABLE CHEST 1 VIEW COMPARISON:  01/12/2023 FINDINGS: Cardiac pacemaker. Power port type central venous catheter with tip over the cavoatrial junction region. No pneumothorax. Heart size and pulmonary vascularity are normal for technique. Interstitial changes in the lungs likely representing fibrosis. No focal consolidation. No pleural effusions. Mediastinal contours appear intact. Degenerative changes in the spine and shoulders. Old rib fractures. IMPRESSION: Chronic interstitial changes consistent with fibrosis. No evidence of active pulmonary disease. Electronically Signed   By: Burman Nieves M.D.   On: 02/05/2023 21:22   CT HEAD WO CONTRAST ( )  Result Date:  02/05/2023 CLINICAL DATA:  Unwitnessed fall EXAM: CT HEAD WITHOUT CONTRAST CT CERVICAL SPINE WITHOUT CONTRAST TECHNIQUE: Multidetector CT imaging of the head and cervical spine was performed following the standard protocol without intravenous contrast. Multiplanar CT image reconstructions of the cervical spine were also generated. RADIATION DOSE REDUCTION: This exam was performed according to the departmental dose-optimization program which includes automated exposure control, adjustment of the mA and/or kV according to patient size and/or use of iterative reconstruction technique. COMPARISON:  01/12/2023 CT head and cervical spine FINDINGS: CT HEAD FINDINGS Brain: No evidence of acute infarct, hemorrhage, mass, mass effect, or midline shift. No hydrocephalus or extra-axial fluid collection. Periventricular white matter changes, likely the sequela of chronic small vessel ischemic disease. Vascular: No hyperdense vessel. Atherosclerotic calcifications in the intracranial carotid and vertebral arteries. Skull: Negative for fracture or focal lesion. Right parietal scalp hematoma Sinuses/Orbits: No acute finding. Status post bilateral lens replacements. Other: The mastoid air cells are well aerated. CT CERVICAL SPINE FINDINGS Alignment: Unchanged trace anterolisthesis of C3 on C4 and C4 on C5. Skull base and vertebrae: Distraction injury at C6-C7, with widening of the previously anteriorly fused disc space (series 6, image 26) and fracture through the right anterior superior C7 endplate (series 7, image 32 and series 6, image 20). The posterior aspect of the disc space appears unchanged. Unchanged partial osseous fusion across the C3-C4 disc space posteriorly. Soft tissues and spinal canal: Prevertebral fluid anterior to C4-T2. (Series 3, image 68). No visible canal hematoma. Disc levels: Degenerative changes in the cervical spine. No significant spinal canal stenosis. Upper chest: Small right pleural effusion. No focal  pulmonary opacity. IMPRESSION: 1. Distraction injury at C6-C7, with fracture through the right anterior superior C7 endplate and widening of the anterior aspect of the disc space at C6-C7. Prevertebral fluid anterior to C4-T2. 2. No acute intracranial process. 3. Small right pleural effusion. These findings were discussed by telephone on 02/05/2023 at 8:21 pm with provider PATRICK ROBINSON . Electronically Signed  By: Wiliam Ke M.D.   On: 02/05/2023 20:24   CT Cervical Spine Wo Contrast  Result Date: 02/05/2023 CLINICAL DATA:  Unwitnessed fall EXAM: CT HEAD WITHOUT CONTRAST CT CERVICAL SPINE WITHOUT CONTRAST TECHNIQUE: Multidetector CT imaging of the head and cervical spine was performed following the standard protocol without intravenous contrast. Multiplanar CT image reconstructions of the cervical spine were also generated. RADIATION DOSE REDUCTION: This exam was performed according to the departmental dose-optimization program which includes automated exposure control, adjustment of the mA and/or kV according to patient size and/or use of iterative reconstruction technique. COMPARISON:  01/12/2023 CT head and cervical spine FINDINGS: CT HEAD FINDINGS Brain: No evidence of acute infarct, hemorrhage, mass, mass effect, or midline shift. No hydrocephalus or extra-axial fluid collection. Periventricular white matter changes, likely the sequela of chronic small vessel ischemic disease. Vascular: No hyperdense vessel. Atherosclerotic calcifications in the intracranial carotid and vertebral arteries. Skull: Negative for fracture or focal lesion. Right parietal scalp hematoma Sinuses/Orbits: No acute finding. Status post bilateral lens replacements. Other: The mastoid air cells are well aerated. CT CERVICAL SPINE FINDINGS Alignment: Unchanged trace anterolisthesis of C3 on C4 and C4 on C5. Skull base and vertebrae: Distraction injury at C6-C7, with widening of the previously anteriorly fused disc space (series  6, image 26) and fracture through the right anterior superior C7 endplate (series 7, image 32 and series 6, image 20). The posterior aspect of the disc space appears unchanged. Unchanged partial osseous fusion across the C3-C4 disc space posteriorly. Soft tissues and spinal canal: Prevertebral fluid anterior to C4-T2. (Series 3, image 68). No visible canal hematoma. Disc levels: Degenerative changes in the cervical spine. No significant spinal canal stenosis. Upper chest: Small right pleural effusion. No focal pulmonary opacity. IMPRESSION: 1. Distraction injury at C6-C7, with fracture through the right anterior superior C7 endplate and widening of the anterior aspect of the disc space at C6-C7. Prevertebral fluid anterior to C4-T2. 2. No acute intracranial process. 3. Small right pleural effusion. These findings were discussed by telephone on 02/05/2023 at 8:21 pm with provider PATRICK ROBINSON . Electronically Signed   By: Wiliam Ke M.D.   On: 02/05/2023 20:24    EKG: Independently reviewed.   Assessment/Plan   Fall Fracture of C7 endplate  Prevertebral fluid anterior to C4-T2 -case discussed with neurosurgery Dr Welton Flakes who rec cervical collar and out patient follow up  -supportive pain regimen  -Fall in setting weakness / low volume from chronic diarrhea  -PT/OT evaluate discharge needs     UTI- -f/u culture data - continue with CTX    Recurrent stage III mantle cell lymphoma  -s/p 43 cycles of Rituxan and Treanda -Last chemo was in January 2024 -pancytopenia , cbc currently stable  -followed by oncology as outpatient Dr Orlie Dakin   Chronic diarrhea  -began with chemo  -followed by GI  -recent c-scope 4/25 noted only polyp and hemorrhoids  -possible cause of patient falls due to orthostasis   Depression -continue ssri    Insulin-dependent diabetes mellitus -resume home regimen  - fs/iss   Hypotension, persistent -requiring midodrine  Hyperlipidemia -continue statin  DVT  prophylaxis: heparin Code Status: full/ as discussed per patient wishes in event of cardiac arrest  Family Communication:  discussed poc with family at bedsdie Alternate Contact Person    Peacock,Susan (Sister) [phone] (Home Phone)   Disposition Plan: patient  expected to be admitted greater than 2 midnights  Consults called: Dr Welton Flakes neurosurgery  consulted by EDP Admission status:  med tele   Lurline Del MD [facility]   If 7PM-7AM, please contact night-coverage [email or web address] Password [facility]  02/05/2023, 11:54 PM

## 2023-02-05 NOTE — ED Triage Notes (Signed)
Pt BIB [facility] from [facility] with complaint of an unwitnessed fall. Pt is A&Ox4. Per pt his legs gave out and he fell and hit his head. Small laceration with controlled bleeding noted to posterior scalp. Pt reports neck pain. Arrived with c-collar in place. Denies LOC. Denies use of blood thinner. Pupils round, reactive and equal. Pt does report bilateral arm pain.

## 2023-02-05 NOTE — H&P (Incomplete)
History and Physical    Gary [name] ZOX:[id number] DOB: 1946-03-26 DOA: 02/05/2023  PCP: Dale [place], MD  Patient coming from: fall [name]  I have personally briefly reviewed patient's old medical records in [facility]  Chief Complaint: fall  HPI: Gary [name] is a 77 y.o. male with medical history significant of    ED Course: ***  Review of Systems: As per HPI otherwise 10 point review of systems negative.   Past Medical History:  Diagnosis Date  . CHF (congestive heart failure) (HCC)   . Depression   . 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 Gastroparesis   . Graves disease   . Hypercholesterolemia   . Hypertension   . Mantle cell lymphoma (HCC) 07/2014  . Peripheral neuropathy   . Shingles     Past Surgical History:  Procedure Laterality Date  . CHOLECYSTECTOMY    . COLONOSCOPY WITH PROPOFOL N/A 02/04/2023   Procedure: COLONOSCOPY WITH PROPOFOL;  Surgeon: [name], MD;  Location: [facility] ENDOSCOPY;  Service: Gastroenterology;  Laterality: N/A;  . ESOPHAGOGASTRODUODENOSCOPY (EGD) WITH PROPOFOL N/A 01/18/2023   Procedure: ESOPHAGOGASTRODUODENOSCOPY (EGD) WITH PROPOFOL;  Surgeon: Jaynie Collins, DO;  Location: [facility] ENDOSCOPY;  Service: Gastroenterology;  Laterality: N/A;  . NEPHRECTOMY     right  . PACEMAKER INSERTION N/A 12/07/2017   Procedure: INSERTION PACEMAKER DUAL CHAMBER INITIAL INSERT;  Surgeon: Marcina Millard, MD;  Location: [facility] ORS;  Service: Cardiovascular;  Laterality: N/A;  . PORTA CATH INSERTION N/A 11/03/2017   Procedure: PORTA CATH INSERTION;  Surgeon: Renford Dills, MD;  Location: [facility] INVASIVE CV LAB;  Service: Cardiovascular;  Laterality: N/A;     reports that he has quit smoking. His smoking use included cigarettes. He has been exposed to tobacco smoke. His smokeless tobacco use includes chew. He reports current alcohol use of about 3.0 standard drinks of alcohol per  week. He reports that he does not use drugs.  Allergies  Allergen Reactions  . Rofecoxib Nausea Only    Family History  Problem Relation Age of Onset  . Breast cancer Mother   . Diabetes Father   . Cancer Father        Lymphoma  . Alcohol abuse Maternal Uncle   . Diabetes Sister   . Heart disease Other        grandfather   *** Prior to Admission medications   Medication Sig Start Date End Date Taking? Authorizing Provider  Cholecalciferol (VITAMIN D3) 50 MCG (2000 UT) CAPS Take 2,000 Units by mouth daily. 03/20/20   [provider]  cholestyramine (QUESTRAN) 4 g packet Take 1 packet (4 g total) by mouth 2 (two) times daily. 01/25/23   Delfino Lovett, MD  feeding supplement, GLUCERNA SHAKE, (GLUCERNA SHAKE) LIQD Take 237 mLs by mouth 2 (two) times daily between meals. 09/13/21   Swayze, Ava, DO  ferrous sulfate 325 (65 FE) MG tablet Take 325 mg by mouth daily with breakfast.    [provider]  FLUoxetine (PROZAC) 20 MG tablet Take 1 tablet (20 mg total) by mouth daily. 11/13/22   Dale Glenwood, MD  furosemide (LASIX) 40 MG tablet Take 2 tablets in the morning, and 1 tablet in the evening. 08/19/22   Dale Salcha, MD  glucose 4 GM chewable tablet Chew 1 tablet (4 g total) by mouth once as needed for low blood sugar. Patient not taking: Reported on 02/02/2023 02/09/21   Dale , MD  insulin aspart (  NOVOLOG) 100 UNIT/ML injection Inject 6 Units into the skin 3 (three) times daily with meals. 09/03/21   Darlin Priestly, MD  Insulin Human (INSULIN PUMP) SOLN Inject into the skin.    [provider]  lactobacillus (FLORANEX/LACTINEX) PACK Take 1 packet (1 g total) by mouth 3 (three) times daily with meals. 01/25/23   Delfino Lovett, MD  levothyroxine (SYNTHROID) 50 MCG tablet Take 1 tablet (50 mcg total) by mouth daily before breakfast. 01/26/23   Delfino Lovett, MD  lidocaine-prilocaine (EMLA) cream Apply 1 Application topically once. 08/26/22   [provider]   loperamide (IMODIUM) 2 MG capsule Take 1 capsule (2 mg total) by mouth as needed for diarrhea or loose stools. 01/25/23   Delfino Lovett, MD  midodrine (PROAMATINE) 10 MG tablet Take 1 tablet (10 mg total) by mouth 3 (three) times daily with meals. 01/25/23   Delfino Lovett, MD  nystatin cream (MYCOSTATIN) APPLY TO AFFECTED AREA TWICE A DAY 11/30/22   Dale Mastic, MD  pantoprazole (PROTONIX) 40 MG tablet Take 1 tablet (40 mg total) by mouth 2 (two) times daily before a meal. 01/25/23   Delfino Lovett, MD  potassium chloride SA (KLOR-CON M) 20 MEQ tablet Take 1 tablet (20 mEq total) by mouth 2 (two) times daily. 01/08/23   Borders, Daryl Eastern, NP  pravastatin (PRAVACHOL) 40 MG tablet TAKE 1 TABLET BY MOUTH EVERY DAY 09/14/22   Dale Easton, MD  vitamin B-12 (CYANOCOBALAMIN) 1000 MCG tablet Take 1,000 mcg by mouth daily.    [provider]    Physical Exam: Vitals:   02/05/23 1904 02/05/23 1905 02/05/23 1930 02/05/23 2245  BP: (!) 121/58  129/66 119/60  Pulse: 69  85 94  Resp: 18   (!) 24  Temp: 98.6 F (37 C)     TempSrc: Oral     SpO2: 100%  100% 100%  Weight:  96.6 kg    Height:  5\' 9"  (1.753 m)      Constitutional: NAD, calm, comfortable Vitals:   02/05/23 1904 02/05/23 1905 02/05/23 1930 02/05/23 2245  BP: (!) 121/58  129/66 119/60  Pulse: 69  85 94  Resp: 18   (!) 24  Temp: 98.6 F (37 C)     TempSrc: Oral     SpO2: 100%  100% 100%  Weight:  96.6 kg    Height:  5\' 9"  (1.753 m)     Eyes: PERRL, lids and conjunctivae normal ENMT: Mucous membranes are moist. Posterior pharynx clear of any exudate or lesions.Normal dentition.  Neck: normal, supple, no masses, no thyromegaly Respiratory: clear to auscultation bilaterally, no wheezing, no crackles. Normal respiratory effort. No accessory muscle use.  Cardiovascular: Regular rate and rhythm, no murmurs / rubs / gallops. No extremity edema. 2+ pedal pulses. No carotid bruits.  Abdomen: no tenderness, no masses palpated. No  hepatosplenomegaly. Bowel sounds positive.  Musculoskeletal: no clubbing / cyanosis. No joint deformity upper and lower extremities. Good ROM, no contractures. Normal muscle tone.  Skin: no rashes, lesions, ulcers. No induration Neurologic: CN 2-12 grossly intact. Sensation intact, DTR normal. Strength 5/5 in all 4.  Psychiatric: Normal judgment and insight. Alert and oriented x 3. Normal mood.    Labs on Admission: I have personally reviewed following labs and imaging studies  CBC: Recent Labs  Lab 02/02/23 1053 02/05/23 2053  WBC 3.0* 2.4*  NEUTROABS 1.2* 0.5*  HGB 9.9* 9.7*  HCT 31.3* 31.3*  MCV 92.9 94.8  PLT 120* 110*   Basic Metabolic  Panel: Recent Labs  Lab 02/02/23 1053 02/05/23 2053  NA 133* 136  K 4.4 4.2  CL 97* 98  CO2 26 31  GLUCOSE 93 155*  BUN 20 14  CREATININE 1.84* 1.12  CALCIUM 7.8* 7.4*   GFR: Estimated Creatinine Clearance: 66.4 mL/min (by C-G formula based on SCr of 1.12 mg/dL). Liver Function Tests: Recent Labs  Lab 02/02/23 1053 02/05/23 2053  AST 27 25  ALT 11 12  ALKPHOS 101 103  BILITOT 0.6 1.0  PROT 5.2* 5.0*  ALBUMIN 2.4* 2.5*   No results for input(s): "LIPASE", "AMYLASE" in the last 168 hours. No results for input(s): "AMMONIA" in the last 168 hours. Coagulation Profile: Recent Labs  Lab 02/05/23 2053  INR 1.4*   Cardiac Enzymes: No results for input(s): "CKTOTAL", "CKMB", "CKMBINDEX", "TROPONINI" in the last 168 hours. BNP (last 3 results) No results for input(s): "PROBNP" in the last 8760 hours. HbA1C: No results for input(s): "HGBA1C" in the last 72 hours. CBG: Recent Labs  Lab 02/04/23 1042 02/05/23 2049  GLUCAP 218* 121*   Lipid Profile: No results for input(s): "CHOL", "HDL", "LDLCALC", "TRIG", "CHOLHDL", "LDLDIRECT" in the last 72 hours. Thyroid Function Tests: No results for input(s): "TSH", "T4TOTAL", "FREET4", "T3FREE", "THYROIDAB" in the last 72 hours. Anemia Panel: No results for input(s):  "VITAMINB12", "FOLATE", "FERRITIN", "TIBC", "IRON", "RETICCTPCT" in the last 72 hours. Urine analysis:    Component Value Date/Time   COLORURINE YELLOW (A) 02/05/2023 2242   APPEARANCEUR CLEAR (A) 02/05/2023 2242   APPEARANCEUR Cloudy 06/13/2014 0823   LABSPEC 1.010 02/05/2023 2242   LABSPEC 1.018 06/13/2014 0823   PHURINE 5.0 02/05/2023 2242   GLUCOSEU NEGATIVE 02/05/2023 2242   GLUCOSEU 50 mg/dL 16/07/9603 5409   HGBUR SMALL (A) 02/05/2023 2242   BILIRUBINUR NEGATIVE 02/05/2023 2242   BILIRUBINUR Negative 06/13/2014 0823   KETONESUR NEGATIVE 02/05/2023 2242   PROTEINUR NEGATIVE 02/05/2023 2242   NITRITE NEGATIVE 02/05/2023 2242   LEUKOCYTESUR MODERATE (A) 02/05/2023 2242   LEUKOCYTESUR 2+ 06/13/2014 0823    Radiological Exams on Admission: DG Pelvis 1-2 Views  Result Date: 02/05/2023 CLINICAL DATA:  Fall EXAM: PELVIS - 1-2 VIEW COMPARISON:  None Available. FINDINGS: The bones are osteopenic. No displaced fractures are visualized. The joint spaces are maintained. Are vascular calcifications in the soft tissues. IMPRESSION: Osteopenia. No displaced fractures are visualized. If there is high clinical concern for hip fracture recommend dedicated hip views. Electronically Signed   By: Darliss Cheney M.D.   On: 02/05/2023 22:41   DG Chest Portable 1 View  Result Date: 02/05/2023 CLINICAL DATA:  Unwitnessed fall. EXAM: PORTABLE CHEST 1 VIEW COMPARISON:  01/12/2023 FINDINGS: Cardiac pacemaker. Power port type central venous catheter with tip over the cavoatrial junction region. No pneumothorax. Heart size and pulmonary vascularity are normal for technique. Interstitial changes in the lungs likely representing fibrosis. No focal consolidation. No pleural effusions. Mediastinal contours appear intact. Degenerative changes in the spine and shoulders. Old rib fractures. IMPRESSION: Chronic interstitial changes consistent with fibrosis. No evidence of active pulmonary disease. Electronically Signed    By: Burman Nieves M.D.   On: 02/05/2023 21:22   CT HEAD WO CONTRAST ( )  Result Date: 02/05/2023 CLINICAL DATA:  Unwitnessed fall EXAM: CT HEAD WITHOUT CONTRAST CT CERVICAL SPINE WITHOUT CONTRAST TECHNIQUE: Multidetector CT imaging of the head and cervical spine was performed following the standard protocol without intravenous contrast. Multiplanar CT image reconstructions of the cervical spine were also generated. RADIATION DOSE REDUCTION: This exam was performed according  to the departmental dose-optimization program which includes automated exposure control, adjustment of the mA and/or kV according to patient size and/or use of iterative reconstruction technique. COMPARISON:  01/12/2023 CT head and cervical spine FINDINGS: CT HEAD FINDINGS Brain: No evidence of acute infarct, hemorrhage, mass, mass effect, or midline shift. No hydrocephalus or extra-axial fluid collection. Periventricular white matter changes, likely the sequela of chronic small vessel ischemic disease. Vascular: No hyperdense vessel. Atherosclerotic calcifications in the intracranial carotid and vertebral arteries. Skull: Negative for fracture or focal lesion. Right parietal scalp hematoma Sinuses/Orbits: No acute finding. Status post bilateral lens replacements. Other: The mastoid air cells are well aerated. CT CERVICAL SPINE FINDINGS Alignment: Unchanged trace anterolisthesis of C3 on C4 and C4 on C5. Skull base and vertebrae: Distraction injury at C6-C7, with widening of the previously anteriorly fused disc space (series 6, image 26) and fracture through the right anterior superior C7 endplate (series 7, image 32 and series 6, image 20). The posterior aspect of the disc space appears unchanged. Unchanged partial osseous fusion across the C3-C4 disc space posteriorly. Soft tissues and spinal canal: Prevertebral fluid anterior to C4-T2. (Series 3, image 68). No visible canal hematoma. Disc levels: Degenerative changes in the cervical  spine. No significant spinal canal stenosis. Upper chest: Small right pleural effusion. No focal pulmonary opacity. IMPRESSION: 1. Distraction injury at C6-C7, with fracture through the right anterior superior C7 endplate and widening of the anterior aspect of the disc space at C6-C7. Prevertebral fluid anterior to C4-T2. 2. No acute intracranial process. 3. Small right pleural effusion. These findings were discussed by telephone on 02/05/2023 at 8:21 pm with provider PATRICK ROBINSON . Electronically Signed   By: Wiliam Ke M.D.   On: 02/05/2023 20:24   CT Cervical Spine Wo Contrast  Result Date: 02/05/2023 CLINICAL DATA:  Unwitnessed fall EXAM: CT HEAD WITHOUT CONTRAST CT CERVICAL SPINE WITHOUT CONTRAST TECHNIQUE: Multidetector CT imaging of the head and cervical spine was performed following the standard protocol without intravenous contrast. Multiplanar CT image reconstructions of the cervical spine were also generated. RADIATION DOSE REDUCTION: This exam was performed according to the departmental dose-optimization program which includes automated exposure control, adjustment of the mA and/or kV according to patient size and/or use of iterative reconstruction technique. COMPARISON:  01/12/2023 CT head and cervical spine FINDINGS: CT HEAD FINDINGS Brain: No evidence of acute infarct, hemorrhage, mass, mass effect, or midline shift. No hydrocephalus or extra-axial fluid collection. Periventricular white matter changes, likely the sequela of chronic small vessel ischemic disease. Vascular: No hyperdense vessel. Atherosclerotic calcifications in the intracranial carotid and vertebral arteries. Skull: Negative for fracture or focal lesion. Right parietal scalp hematoma Sinuses/Orbits: No acute finding. Status post bilateral lens replacements. Other: The mastoid air cells are well aerated. CT CERVICAL SPINE FINDINGS Alignment: Unchanged trace anterolisthesis of C3 on C4 and C4 on C5. Skull base and vertebrae:  Distraction injury at C6-C7, with widening of the previously anteriorly fused disc space (series 6, image 26) and fracture through the right anterior superior C7 endplate (series 7, image 32 and series 6, image 20). The posterior aspect of the disc space appears unchanged. Unchanged partial osseous fusion across the C3-C4 disc space posteriorly. Soft tissues and spinal canal: Prevertebral fluid anterior to C4-T2. (Series 3, image 68). No visible canal hematoma. Disc levels: Degenerative changes in the cervical spine. No significant spinal canal stenosis. Upper chest: Small right pleural effusion. No focal pulmonary opacity. IMPRESSION: 1. Distraction injury at C6-C7, with fracture through the  right anterior superior C7 endplate and widening of the anterior aspect of the disc space at C6-C7. Prevertebral fluid anterior to C4-T2. 2. No acute intracranial process. 3. Small right pleural effusion. These findings were discussed by telephone on 02/05/2023 at 8:21 pm with provider PATRICK ROBINSON . Electronically Signed   By: Wiliam Ke M.D.   On: 02/05/2023 20:24    EKG: Independently reviewed. ***  Assessment/Plan Active Problems:   * No active hospital problems. *   ***  DVT prophylaxis: *** (Lovenox/Heparin/SCD's/anticoagulated/None (if comfort care) Code Status: *** (Full/Partial (specify details) Family Communication: *** (Specify name, relationship. Do not write "discussed with patient". Specify tel # if discussed over the phone) Disposition Plan: *** (specify when and where you expect patient to be discharged) Consults called: *** (with names) Admission status: *** (inpatient / obs / tele / medical floor / SDU)   Lurline Del MD [facility] Pager [phone]  If 7PM-7AM, please contact night-coverage [email or web address] Password [facility]  02/05/2023, 11:54 PM

## 2023-02-06 DIAGNOSIS — R69 Illness, unspecified: Secondary | ICD-10-CM | POA: Diagnosis not present

## 2023-02-06 DIAGNOSIS — Y9301 Activity, walking, marching and hiking: Secondary | ICD-10-CM | POA: Diagnosis present

## 2023-02-06 DIAGNOSIS — I6782 Cerebral ischemia: Secondary | ICD-10-CM | POA: Diagnosis not present

## 2023-02-06 DIAGNOSIS — Z515 Encounter for palliative care: Secondary | ICD-10-CM | POA: Diagnosis not present

## 2023-02-06 DIAGNOSIS — D529 Folate deficiency anemia, unspecified: Secondary | ICD-10-CM | POA: Diagnosis not present

## 2023-02-06 DIAGNOSIS — C8310 Mantle cell lymphoma, unspecified site: Secondary | ICD-10-CM | POA: Diagnosis present

## 2023-02-06 DIAGNOSIS — R8281 Pyuria: Secondary | ICD-10-CM | POA: Diagnosis not present

## 2023-02-06 DIAGNOSIS — K649 Unspecified hemorrhoids: Secondary | ICD-10-CM | POA: Diagnosis not present

## 2023-02-06 DIAGNOSIS — W0110XA Fall on same level from slipping, tripping and stumbling with subsequent striking against unspecified object, initial encounter: Secondary | ICD-10-CM | POA: Diagnosis present

## 2023-02-06 DIAGNOSIS — Z66 Do not resuscitate: Secondary | ICD-10-CM | POA: Diagnosis not present

## 2023-02-06 DIAGNOSIS — K529 Noninfective gastroenteritis and colitis, unspecified: Secondary | ICD-10-CM | POA: Diagnosis not present

## 2023-02-06 DIAGNOSIS — E109 Type 1 diabetes mellitus without complications: Secondary | ICD-10-CM | POA: Diagnosis not present

## 2023-02-06 DIAGNOSIS — Z7189 Other specified counseling: Secondary | ICD-10-CM | POA: Diagnosis not present

## 2023-02-06 DIAGNOSIS — R490 Dysphonia: Secondary | ICD-10-CM | POA: Diagnosis not present

## 2023-02-06 DIAGNOSIS — E7800 Pure hypercholesterolemia, unspecified: Secondary | ICD-10-CM | POA: Diagnosis present

## 2023-02-06 DIAGNOSIS — I5022 Chronic systolic (congestive) heart failure: Secondary | ICD-10-CM | POA: Diagnosis not present

## 2023-02-06 DIAGNOSIS — M6281 Muscle weakness (generalized): Secondary | ICD-10-CM | POA: Diagnosis not present

## 2023-02-06 DIAGNOSIS — R1312 Dysphagia, oropharyngeal phase: Secondary | ICD-10-CM | POA: Diagnosis not present

## 2023-02-06 DIAGNOSIS — S12691A Other nondisplaced fracture of seventh cervical vertebra, initial encounter for closed fracture: Secondary | ICD-10-CM | POA: Diagnosis present

## 2023-02-06 DIAGNOSIS — E039 Hypothyroidism, unspecified: Secondary | ICD-10-CM | POA: Diagnosis present

## 2023-02-06 DIAGNOSIS — L89322 Pressure ulcer of left buttock, stage 2: Secondary | ICD-10-CM | POA: Diagnosis present

## 2023-02-06 DIAGNOSIS — I110 Hypertensive heart disease with heart failure: Secondary | ICD-10-CM | POA: Diagnosis present

## 2023-02-06 DIAGNOSIS — E1065 Type 1 diabetes mellitus with hyperglycemia: Secondary | ICD-10-CM | POA: Diagnosis present

## 2023-02-06 DIAGNOSIS — M8580 Other specified disorders of bone density and structure, unspecified site: Secondary | ICD-10-CM | POA: Diagnosis not present

## 2023-02-06 DIAGNOSIS — N390 Urinary tract infection, site not specified: Secondary | ICD-10-CM | POA: Diagnosis present

## 2023-02-06 DIAGNOSIS — S12601A Unspecified nondisplaced fracture of seventh cervical vertebra, initial encounter for closed fracture: Secondary | ICD-10-CM | POA: Diagnosis not present

## 2023-02-06 DIAGNOSIS — E1043 Type 1 diabetes mellitus with diabetic autonomic (poly)neuropathy: Secondary | ICD-10-CM | POA: Diagnosis present

## 2023-02-06 DIAGNOSIS — E1042 Type 1 diabetes mellitus with diabetic polyneuropathy: Secondary | ICD-10-CM | POA: Diagnosis present

## 2023-02-06 DIAGNOSIS — L89312 Pressure ulcer of right buttock, stage 2: Secondary | ICD-10-CM | POA: Diagnosis present

## 2023-02-06 DIAGNOSIS — R499 Unspecified voice and resonance disorder: Secondary | ICD-10-CM | POA: Diagnosis not present

## 2023-02-06 DIAGNOSIS — K3184 Gastroparesis: Secondary | ICD-10-CM | POA: Diagnosis present

## 2023-02-06 DIAGNOSIS — L89156 Pressure-induced deep tissue damage of sacral region: Secondary | ICD-10-CM | POA: Diagnosis present

## 2023-02-06 DIAGNOSIS — Y92129 Unspecified place in nursing home as the place of occurrence of the external cause: Secondary | ICD-10-CM | POA: Diagnosis not present

## 2023-02-06 DIAGNOSIS — D630 Anemia in neoplastic disease: Secondary | ICD-10-CM | POA: Diagnosis present

## 2023-02-06 DIAGNOSIS — Z741 Need for assistance with personal care: Secondary | ICD-10-CM | POA: Diagnosis not present

## 2023-02-06 DIAGNOSIS — E538 Deficiency of other specified B group vitamins: Secondary | ICD-10-CM | POA: Diagnosis not present

## 2023-02-06 DIAGNOSIS — E785 Hyperlipidemia, unspecified: Secondary | ICD-10-CM | POA: Diagnosis not present

## 2023-02-06 DIAGNOSIS — I951 Orthostatic hypotension: Secondary | ICD-10-CM | POA: Diagnosis not present

## 2023-02-06 DIAGNOSIS — I10 Essential (primary) hypertension: Secondary | ICD-10-CM | POA: Diagnosis not present

## 2023-02-06 DIAGNOSIS — Z794 Long term (current) use of insulin: Secondary | ICD-10-CM | POA: Diagnosis not present

## 2023-02-06 DIAGNOSIS — N179 Acute kidney failure, unspecified: Secondary | ICD-10-CM | POA: Diagnosis not present

## 2023-02-06 DIAGNOSIS — D61818 Other pancytopenia: Secondary | ICD-10-CM | POA: Diagnosis present

## 2023-02-06 DIAGNOSIS — W19XXXD Unspecified fall, subsequent encounter: Secondary | ICD-10-CM | POA: Diagnosis not present

## 2023-02-06 DIAGNOSIS — R296 Repeated falls: Secondary | ICD-10-CM | POA: Diagnosis not present

## 2023-02-06 DIAGNOSIS — R4182 Altered mental status, unspecified: Secondary | ICD-10-CM | POA: Diagnosis not present

## 2023-02-06 DIAGNOSIS — S0101XA Laceration without foreign body of scalp, initial encounter: Secondary | ICD-10-CM | POA: Diagnosis not present

## 2023-02-06 DIAGNOSIS — R262 Difficulty in walking, not elsewhere classified: Secondary | ICD-10-CM | POA: Diagnosis not present

## 2023-02-06 DIAGNOSIS — R2681 Unsteadiness on feet: Secondary | ICD-10-CM | POA: Diagnosis not present

## 2023-02-06 DIAGNOSIS — D122 Benign neoplasm of ascending colon: Secondary | ICD-10-CM | POA: Diagnosis present

## 2023-02-06 DIAGNOSIS — D649 Anemia, unspecified: Secondary | ICD-10-CM | POA: Diagnosis not present

## 2023-02-06 DIAGNOSIS — I509 Heart failure, unspecified: Secondary | ICD-10-CM | POA: Diagnosis present

## 2023-02-06 DIAGNOSIS — E0500 Thyrotoxicosis with diffuse goiter without thyrotoxic crisis or storm: Secondary | ICD-10-CM | POA: Diagnosis present

## 2023-02-06 DIAGNOSIS — R531 Weakness: Secondary | ICD-10-CM | POA: Diagnosis present

## 2023-02-06 DIAGNOSIS — S12631A Unspecified traumatic nondisplaced spondylolisthesis of seventh cervical vertebra, initial encounter for closed fracture: Secondary | ICD-10-CM | POA: Diagnosis not present

## 2023-02-06 DIAGNOSIS — S12600D Unspecified displaced fracture of seventh cervical vertebra, subsequent encounter for fracture with routine healing: Secondary | ICD-10-CM | POA: Diagnosis not present

## 2023-02-06 DIAGNOSIS — W19XXXA Unspecified fall, initial encounter: Secondary | ICD-10-CM | POA: Diagnosis not present

## 2023-02-06 DIAGNOSIS — F32A Depression, unspecified: Secondary | ICD-10-CM | POA: Diagnosis present

## 2023-02-06 DIAGNOSIS — E10311 Type 1 diabetes mellitus with unspecified diabetic retinopathy with macular edema: Secondary | ICD-10-CM | POA: Diagnosis present

## 2023-02-06 DIAGNOSIS — D638 Anemia in other chronic diseases classified elsewhere: Secondary | ICD-10-CM | POA: Diagnosis present

## 2023-02-06 LAB — CBC
HCT: 28.8 % — ABNORMAL LOW (ref 39.0–52.0)
Hemoglobin: 8.8 g/dL — ABNORMAL LOW (ref 13.0–17.0)
MCH: 29.3 pg (ref 26.0–34.0)
MCHC: 30.6 g/dL (ref 30.0–36.0)
MCV: 96 fL (ref 80.0–100.0)
Platelets: 116 10*3/uL — ABNORMAL LOW (ref 150–400)
RBC: 3 MIL/uL — ABNORMAL LOW (ref 4.22–5.81)
RDW: 15.4 % (ref 11.5–15.5)
WBC: 1.9 10*3/uL — ABNORMAL LOW (ref 4.0–10.5)
nRBC: 0 % (ref 0.0–0.2)

## 2023-02-06 LAB — BASIC METABOLIC PANEL
Anion gap: 12 (ref 5–15)
BUN: 14 mg/dL (ref 8–23)
CO2: 25 mmol/L (ref 22–32)
Calcium: 7.4 mg/dL — ABNORMAL LOW (ref 8.9–10.3)
Chloride: 98 mmol/L (ref 98–111)
Creatinine, Ser: 1.16 mg/dL (ref 0.61–1.24)
GFR, Estimated: >60 mL/min (ref >60)
Glucose, Bld: 234 mg/dL — ABNORMAL HIGH (ref 70–99)
Potassium: 4.3 mmol/L (ref 3.5–5.1)
Sodium: 135 mmol/L (ref 135–145)

## 2023-02-06 LAB — TSH: TSH: 5.824 u[IU]/mL — ABNORMAL HIGH (ref 0.350–4.500)

## 2023-02-06 LAB — CBG MONITORING, ED: Glucose-Capillary: 496 mg/dL — ABNORMAL HIGH (ref 70–99)

## 2023-02-06 LAB — GLUCOSE, CAPILLARY
Glucose-Capillary: 299 mg/dL — ABNORMAL HIGH (ref 70–99)
Glucose-Capillary: 235 mg/dL — ABNORMAL HIGH (ref 70–99)
Glucose-Capillary: 236 mg/dL — ABNORMAL HIGH (ref 70–99)
Glucose-Capillary: 199 mg/dL — ABNORMAL HIGH (ref 70–99)
Glucose-Capillary: 167 mg/dL — ABNORMAL HIGH (ref 70–99)
Glucose-Capillary: 168 mg/dL — ABNORMAL HIGH (ref 70–99)

## 2023-02-06 LAB — LACTIC ACID, PLASMA
Lactic Acid, Venous: 0.9 mmol/L (ref 0.5–1.9)
Lactic Acid, Venous: 1.9 mmol/L (ref 0.5–1.9)

## 2023-02-06 LAB — FOLATE: Folate: 3.3 ng/mL — ABNORMAL LOW (ref >5.9)

## 2023-02-06 LAB — PHOSPHORUS: Phosphorus: 2.8 mg/dL (ref 2.5–4.6)

## 2023-02-06 LAB — MAGNESIUM: Magnesium: 1.4 mg/dL — ABNORMAL LOW (ref 1.7–2.4)

## 2023-02-06 LAB — CREATININE, SERUM
Creatinine, Ser: 1.22 mg/dL (ref 0.61–1.24)
GFR, Estimated: >60 mL/min (ref >60)

## 2023-02-06 MED ORDER — INSULIN ASPART 100 UNIT/ML IJ SOLN
0.0000 [IU] | Freq: Three times a day (TID) | INTRAMUSCULAR | Status: DC
  Administered 2023-02-06: 9 [IU] via SUBCUTANEOUS
  Filled 2023-02-06: qty 1

## 2023-02-06 MED ORDER — INSULIN ASPART 100 UNIT/ML IJ SOLN
10.0000 [IU] | Freq: Once | INTRAMUSCULAR | Status: AC
  Administered 2023-02-06: 10 [IU] via INTRAVENOUS
  Filled 2023-02-06: qty 1

## 2023-02-06 MED ORDER — SODIUM CHLORIDE 0.9 % IV SOLN: INTRAVENOUS | Status: DC

## 2023-02-06 MED ORDER — LEVOTHYROXINE SODIUM 50 MCG PO TABS
50.0000 ug | ORAL_TABLET | Freq: Every day | ORAL | Status: DC
  Administered 2023-02-07: 50 ug via ORAL
  Filled 2023-02-06 (×2): qty 1

## 2023-02-06 MED ORDER — PANTOPRAZOLE SODIUM 40 MG PO TBEC
40.0000 mg | DELAYED_RELEASE_TABLET | Freq: Two times a day (BID) | ORAL | Status: DC
  Administered 2023-02-06 – 2023-02-17 (×21): 40 mg via ORAL
  Filled 2023-02-06 (×21): qty 1

## 2023-02-06 MED ORDER — ACETAMINOPHEN 325 MG PO TABS
650.0000 mg | ORAL_TABLET | Freq: Four times a day (QID) | ORAL | Status: DC | PRN
  Administered 2023-02-09 – 2023-02-10 (×3): 650 mg via ORAL
  Filled 2023-02-06 (×4): qty 2

## 2023-02-06 MED ORDER — MIDODRINE HCL 5 MG PO TABS
10.0000 mg | ORAL_TABLET | Freq: Three times a day (TID) | ORAL | Status: DC
  Administered 2023-02-06 – 2023-02-16 (×13): 10 mg via ORAL
  Filled 2023-02-06 (×13): qty 2

## 2023-02-06 MED ORDER — INSULIN GLARGINE-YFGN 100 UNIT/ML ~~LOC~~ SOLN
5.0000 [IU] | Freq: Every day | SUBCUTANEOUS | Status: DC
  Administered 2023-02-08 – 2023-02-17 (×10): 5 [IU] via SUBCUTANEOUS
  Filled 2023-02-06 (×11): qty 0.05

## 2023-02-06 MED ORDER — PRAVASTATIN SODIUM 20 MG PO TABS
40.0000 mg | ORAL_TABLET | Freq: Every day | ORAL | Status: DC
  Administered 2023-02-06 – 2023-02-16 (×11): 40 mg via ORAL
  Filled 2023-02-06 (×11): qty 2

## 2023-02-06 MED ORDER — SODIUM CHLORIDE 0.9 % IV BOLUS
500.0000 mL | Freq: Once | INTRAVENOUS | Status: AC
  Administered 2023-02-06: 500 mL via INTRAVENOUS

## 2023-02-06 MED ORDER — LOPERAMIDE HCL 2 MG PO CAPS: 2.0000 mg | ORAL_CAPSULE | ORAL | Status: DC | PRN

## 2023-02-06 MED ORDER — INSULIN ASPART 100 UNIT/ML IJ SOLN
0.0000 [IU] | INTRAMUSCULAR | Status: DC
  Administered 2023-02-06: 2 [IU] via SUBCUTANEOUS
  Administered 2023-02-06 – 2023-02-09 (×6): 1 [IU] via SUBCUTANEOUS
  Administered 2023-02-09 – 2023-02-10 (×2): 2 [IU] via SUBCUTANEOUS
  Administered 2023-02-10 – 2023-02-12 (×6): 1 [IU] via SUBCUTANEOUS
  Administered 2023-02-13: 2 [IU] via SUBCUTANEOUS
  Administered 2023-02-13: 1 [IU] via SUBCUTANEOUS
  Administered 2023-02-13: 2 [IU] via SUBCUTANEOUS
  Administered 2023-02-13: 4 [IU] via SUBCUTANEOUS
  Administered 2023-02-13 – 2023-02-14 (×3): 1 [IU] via SUBCUTANEOUS
  Administered 2023-02-14 (×2): 2 [IU] via SUBCUTANEOUS
  Administered 2023-02-15 (×4): 1 [IU] via SUBCUTANEOUS
  Administered 2023-02-16 (×3): 2 [IU] via SUBCUTANEOUS
  Administered 2023-02-16: 1 [IU] via SUBCUTANEOUS
  Administered 2023-02-16 (×2): 2 [IU] via SUBCUTANEOUS
  Administered 2023-02-17: 1 [IU] via SUBCUTANEOUS
  Filled 2023-02-06 (×35): qty 1

## 2023-02-06 MED ORDER — CHOLESTYRAMINE 4 G PO PACK
4.0000 g | PACK | Freq: Two times a day (BID) | ORAL | Status: DC
  Administered 2023-02-07 – 2023-02-08 (×2): 4 g via ORAL
  Filled 2023-02-06 (×7): qty 1

## 2023-02-06 MED ORDER — CLOTRIMAZOLE-BETAMETHASONE 1-0.05 % EX CREA
TOPICAL_CREAM | Freq: Two times a day (BID) | CUTANEOUS | Status: DC
  Administered 2023-02-15: 1 via TOPICAL
  Filled 2023-02-06 (×3): qty 15

## 2023-02-06 MED ORDER — INSULIN GLARGINE-YFGN 100 UNIT/ML ~~LOC~~ SOLN
20.0000 [IU] | Freq: Two times a day (BID) | SUBCUTANEOUS | Status: DC
  Administered 2023-02-06: 20 [IU] via SUBCUTANEOUS
  Filled 2023-02-06 (×2): qty 0.2

## 2023-02-06 MED ORDER — SODIUM CHLORIDE 0.9 % IV SOLN
1.0000 g | INTRAVENOUS | Status: DC
  Administered 2023-02-06 – 2023-02-07 (×3): 1 g via INTRAVENOUS
  Filled 2023-02-06 (×2): qty 1
  Filled 2023-02-06: qty 10

## 2023-02-06 MED ORDER — ONDANSETRON HCL 4 MG/2ML IJ SOLN
4.0000 mg | Freq: Four times a day (QID) | INTRAMUSCULAR | Status: DC | PRN
  Administered 2023-02-13: 4 mg via INTRAVENOUS
  Filled 2023-02-06: qty 2

## 2023-02-06 MED ORDER — FLUOXETINE HCL 20 MG PO CAPS
20.0000 mg | ORAL_CAPSULE | Freq: Every day | ORAL | Status: DC
  Administered 2023-02-07 – 2023-02-17 (×10): 20 mg via ORAL
  Filled 2023-02-06 (×10): qty 1

## 2023-02-06 MED ORDER — GLUCOSE 4 G PO CHEW
1.0000 | CHEWABLE_TABLET | Freq: Once | ORAL | Status: DC | PRN
  Filled 2023-02-06: qty 1

## 2023-02-06 MED ORDER — FLORANEX PO PACK
1.0000 g | PACK | Freq: Three times a day (TID) | ORAL | Status: DC
  Administered 2023-02-06 – 2023-02-17 (×30): 1 g via ORAL
  Filled 2023-02-06 (×36): qty 1

## 2023-02-06 MED ORDER — HEPARIN SODIUM (PORCINE) 5000 UNIT/ML IJ SOLN
5000.0000 [IU] | Freq: Three times a day (TID) | INTRAMUSCULAR | Status: DC
  Administered 2023-02-06 – 2023-02-16 (×30): 5000 [IU] via SUBCUTANEOUS
  Filled 2023-02-06 (×30): qty 1

## 2023-02-06 MED ORDER — FERROUS SULFATE 325 (65 FE) MG PO TABS
325.0000 mg | ORAL_TABLET | Freq: Every day | ORAL | Status: DC
  Administered 2023-02-07 – 2023-02-17 (×10): 325 mg via ORAL
  Filled 2023-02-06 (×10): qty 1

## 2023-02-06 NOTE — Progress Notes (Signed)
[facility] Progress Note  Patient: Gary [name]    ZOX:[id number]  DOA: 02/05/2023     Date of Service: the patient was seen and examined on 02/06/2023  Chief Complaint  Patient presents with   Fall   Brief hospital course: Gary [name] is a 77 y.o. male with medical history significant of  recurrent stage III mantle cell lymphoma s/p 43 cycles of Rituxan and Treanda, depression, insulin-dependent diabetes mellitus, hypertension, hyperlipidemia, who presents to ED with increase falls over the last week at [facility]. Patient of note has interim history of admission 01/12/2023-01/25/23 at which time patient was treated for  Sepsis due to cellulitis, as well as AKI. Patient at that time had presented to ED with generalized weakness and hypotension.  Patient also has interim history of colonoscopy on 02/04/23 with results noting polyp descending colon/hemorrhoids otherwise normal :Patient currently states he was attempting to go to the bathroom and his leg gave out on him and he was not able to use his arms to hold himself up due to being weak. Sister also aides in history she is concerned about current rehab has has had 3 falls since being admitted on 4/15. She is also concerned about his lack of appetite. She also note him picking at the air when he sleeps which is new for him and she is concerned about medication side-effect. Patient notes no fever/chills/, but does have nausea , no current abdominal pain or chest pain.   ED Course:  Afeb, bp 121/58, HR78, rr 18 sat 100%  UA : LE Moderate, bacteria rare CTH/Cervical spine IMPRESSION: 1. Distraction injury at C6-C7, with fracture through the right anterior superior C7 endplate and widening of the anterior aspect of the disc space at C6-C7. Prevertebral fluid anterior to C4-T2. 2. No acute intracranial process. 3. Small right pleural effusion.   Cxr: IMPRESSION: Chronic interstitial changes consistent with fibrosis. No evidence of active  pulmonary disease.   Wbc 2.4, hgb 9.7 at baseline,plt 110 Inr 1.4  NA 136, K 4.2, glu 155,  Xray pelvis IMPRESSION: Osteopenia. No displaced fractures are visualized. If there is high clinical concern for hip fracture recommend dedicated hip views.   EKG ventricular paced    UA + wbc , LE   Tx tylenol, ultram    Assessment and Plan:  # Fracture of C7 endplate s/p Fall due to unknown reason Prevertebral fluid anterior to C4-T2 -case discussed with neurosurgery Dr Welton Flakes who rec cervical collar and out patient follow up  -supportive pain regimen  -Fall in setting weakness / low volume from chronic diarrhea  Check orthostatic vital signs Continue fall precautions, ambulate with assistance, turn patient every 2 hours Continue aspiration precautions -PT/OT evaluate discharge needs  Follow SLP eval for dysphagia and aspiration    UTI- -f/u culture data - continue with CTX      Recurrent stage III mantle cell lymphoma  -s/p 43 cycles of Rituxan and Treanda -Last chemo was in January 2024 -pancytopenia , cbc currently stable  -followed by oncology as outpatient Dr Orlie Dakin    Chronic diarrhea  -began with chemo  -followed by GI  -recent c-scope 4/25 noted only polyp and hemorrhoids  -possible cause of patient falls due to orthostasis    Depression -continue ssri    Type 1 diabetes mellitus  Uncontrolled blood glucose could be due to lack of insulin use Diabetic coordinator consulted, Started Semglee 5 units daily and NovoLog sliding scale, monitor CBG Continue diabetic diet  Hypotension, persistent -requiring midodrine   Hyperlipidemia -continue statin   Right lower extremity chronic wound and bilateral lower extremity pigmentation and dry skin Wound care RN consulted Started Lotrisone cream BID  Body mass index is 29.69 kg/m.  Interventions:   Pressure Injury 01/12/23 Buttocks Right;Left Stage 2 -  Partial thickness loss of dermis presenting as a  shallow open injury with a red, pink wound bed without slough. (Active)  01/12/23 2330  Location: Buttocks  Location Orientation: Right;Left  Staging: Stage 2 -  Partial thickness loss of dermis presenting as a shallow open injury with a red, pink wound bed without slough.  Wound Description (Comments):   Present on Admission: Yes  Dressing Type Foam - Lift dressing to assess site every shift 01/25/23 0713     Pressure Injury 01/14/23 Coccyx Bilateral Deep Tissue Pressure Injury - Purple or maroon localized area of discolored intact skin or blood-filled blister due to damage of underlying soft tissue from pressure and/or shear. 4 DTI areas noted (Active)  01/14/23 1751  Location: Coccyx  Location Orientation: Bilateral  Staging: Deep Tissue Pressure Injury - Purple or maroon localized area of discolored intact skin or blood-filled blister due to damage of underlying soft tissue from pressure and/or shear.  Wound Description (Comments): 4 DTI areas noted  Present on Admission:   Dressing Type Foam - Lift dressing to assess site every shift 01/25/23 0713     Diet: Diabetic diet DVT Prophylaxis: Subcutaneous Heparin    Advance goals of care discussion: Full code  Family Communication: family was not present at bedside, at the time of interview.  The pt provided permission to discuss medical plan with the family. Opportunity was given to ask question and all questions were answered satisfactorily.   Disposition:  Pt is from [facility], admitted with fall and C7 Fx and uncontrolled DM, still has C-collar and high CBG, which precludes a safe discharge. Discharge to SNF, when clinically stable, may need 1-2 more days to stay..  Subjective: No significant events overnight, patient is complaining of discomfort due to cervical collar but educated that he needs to wear it.  Denies any other complaints.  Physical Exam: General: NAD, lying comfortably Appear in no distress, affect appropriate Eyes:  PERRLA ENT: Oral Mucosa Clear, moist,  Neck: no JVD, cervical collar intact Cardiovascular: S1 and S2 Present, no Murmur,  Respiratory: good respiratory effort, Bilateral Air entry equal and Decreased, no Crackles, no wheezes Abdomen: Bowel Sound present, Soft and no tenderness,  Skin: no rashes Extremities: Mild edema, right lower extremity chronic wound, dry skin and chronic pigmentation bilateral lower extremities.   Neurologic: without any new focal findings Gait not checked due to patient safety concerns  Vitals:   02/06/23 0800 02/06/23 0807 02/06/23 0830 02/06/23 1044  BP: (!) 98/47  (!) 109/48 (!) 95/49  Pulse: 66  64 (!) 120  Resp: (!) 21  19 20   Temp:  98.6 F (37 C)  98.3 F (36.8 C)  TempSrc:  Axillary  Axillary  SpO2: 100%  98% 100%  Weight:    91.2 kg  Height:        Intake/Output Summary (Last 24 hours) at 02/06/2023 1204 Last data filed at 02/06/2023 0146 Gross per 24 hour  Intake 100 ml  Output --  Net 100 ml   Filed Weights   02/05/23 1905 02/06/23 1044  Weight: 96.6 kg 91.2 kg    Data Reviewed: I have personally reviewed and interpreted daily labs, tele strips, imagings as  discussed above. I reviewed all nursing notes, pharmacy notes, vitals, pertinent old records I have discussed plan of care as described above with RN and patient/family.  CBC: Recent Labs  Lab 02/02/23 1053 02/05/23 2053 02/06/23 0100  WBC 3.0* 2.4* 1.9*  NEUTROABS 1.2* 0.5*  --   HGB 9.9* 9.7* 8.8*  HCT 31.3* 31.3* 28.8*  MCV 92.9 94.8 96.0  PLT 120* 110* 116*   Basic Metabolic Panel: Recent Labs  Lab 02/02/23 1053 02/05/23 2053 02/06/23 0100 02/06/23 0108  NA 133* 136  --  135  K 4.4 4.2  --  4.3  CL 97* 98  --  98  CO2 26 31  --  25  GLUCOSE 93 155*  --  234*  BUN 20 14  --  14  CREATININE 1.84* 1.12 1.22 1.16  CALCIUM 7.8* 7.4*  --  7.4*  MG  --   --   --  1.4*  PHOS  --   --   --  2.8    Studies: DG Pelvis 1-2 Views  Result Date: 02/05/2023 CLINICAL  DATA:  Fall EXAM: PELVIS - 1-2 VIEW COMPARISON:  None Available. FINDINGS: The bones are osteopenic. No displaced fractures are visualized. The joint spaces are maintained. Are vascular calcifications in the soft tissues. IMPRESSION: Osteopenia. No displaced fractures are visualized. If there is high clinical concern for hip fracture recommend dedicated hip views. Electronically Signed   By: Darliss Cheney M.D.   On: 02/05/2023 22:41   DG Chest Portable 1 View  Result Date: 02/05/2023 CLINICAL DATA:  Unwitnessed fall. EXAM: PORTABLE CHEST 1 VIEW COMPARISON:  01/12/2023 FINDINGS: Cardiac pacemaker. Power port type central venous catheter with tip over the cavoatrial junction region. No pneumothorax. Heart size and pulmonary vascularity are normal for technique. Interstitial changes in the lungs likely representing fibrosis. No focal consolidation. No pleural effusions. Mediastinal contours appear intact. Degenerative changes in the spine and shoulders. Old rib fractures. IMPRESSION: Chronic interstitial changes consistent with fibrosis. No evidence of active pulmonary disease. Electronically Signed   By: Burman Nieves M.D.   On: 02/05/2023 21:22   CT HEAD WO CONTRAST ( )  Result Date: 02/05/2023 CLINICAL DATA:  Unwitnessed fall EXAM: CT HEAD WITHOUT CONTRAST CT CERVICAL SPINE WITHOUT CONTRAST TECHNIQUE: Multidetector CT imaging of the head and cervical spine was performed following the standard protocol without intravenous contrast. Multiplanar CT image reconstructions of the cervical spine were also generated. RADIATION DOSE REDUCTION: This exam was performed according to the departmental dose-optimization program which includes automated exposure control, adjustment of the mA and/or kV according to patient size and/or use of iterative reconstruction technique. COMPARISON:  01/12/2023 CT head and cervical spine FINDINGS: CT HEAD FINDINGS Brain: No evidence of acute infarct, hemorrhage, mass, mass effect,  or midline shift. No hydrocephalus or extra-axial fluid collection. Periventricular white matter changes, likely the sequela of chronic small vessel ischemic disease. Vascular: No hyperdense vessel. Atherosclerotic calcifications in the intracranial carotid and vertebral arteries. Skull: Negative for fracture or focal lesion. Right parietal scalp hematoma Sinuses/Orbits: No acute finding. Status post bilateral lens replacements. Other: The mastoid air cells are well aerated. CT CERVICAL SPINE FINDINGS Alignment: Unchanged trace anterolisthesis of C3 on C4 and C4 on C5. Skull base and vertebrae: Distraction injury at C6-C7, with widening of the previously anteriorly fused disc space (series 6, image 26) and fracture through the right anterior superior C7 endplate (series 7, image 32 and series 6, image 20). The posterior aspect of the disc space appears  unchanged. Unchanged partial osseous fusion across the C3-C4 disc space posteriorly. Soft tissues and spinal canal: Prevertebral fluid anterior to C4-T2. (Series 3, image 68). No visible canal hematoma. Disc levels: Degenerative changes in the cervical spine. No significant spinal canal stenosis. Upper chest: Small right pleural effusion. No focal pulmonary opacity. IMPRESSION: 1. Distraction injury at C6-C7, with fracture through the right anterior superior C7 endplate and widening of the anterior aspect of the disc space at C6-C7. Prevertebral fluid anterior to C4-T2. 2. No acute intracranial process. 3. Small right pleural effusion. These findings were discussed by telephone on 02/05/2023 at 8:21 pm with provider PATRICK ROBINSON . Electronically Signed   By: Wiliam Ke M.D.   On: 02/05/2023 20:24   CT Cervical Spine Wo Contrast  Result Date: 02/05/2023 CLINICAL DATA:  Unwitnessed fall EXAM: CT HEAD WITHOUT CONTRAST CT CERVICAL SPINE WITHOUT CONTRAST TECHNIQUE: Multidetector CT imaging of the head and cervical spine was performed following the standard  protocol without intravenous contrast. Multiplanar CT image reconstructions of the cervical spine were also generated. RADIATION DOSE REDUCTION: This exam was performed according to the departmental dose-optimization program which includes automated exposure control, adjustment of the mA and/or kV according to patient size and/or use of iterative reconstruction technique. COMPARISON:  01/12/2023 CT head and cervical spine FINDINGS: CT HEAD FINDINGS Brain: No evidence of acute infarct, hemorrhage, mass, mass effect, or midline shift. No hydrocephalus or extra-axial fluid collection. Periventricular white matter changes, likely the sequela of chronic small vessel ischemic disease. Vascular: No hyperdense vessel. Atherosclerotic calcifications in the intracranial carotid and vertebral arteries. Skull: Negative for fracture or focal lesion. Right parietal scalp hematoma Sinuses/Orbits: No acute finding. Status post bilateral lens replacements. Other: The mastoid air cells are well aerated. CT CERVICAL SPINE FINDINGS Alignment: Unchanged trace anterolisthesis of C3 on C4 and C4 on C5. Skull base and vertebrae: Distraction injury at C6-C7, with widening of the previously anteriorly fused disc space (series 6, image 26) and fracture through the right anterior superior C7 endplate (series 7, image 32 and series 6, image 20). The posterior aspect of the disc space appears unchanged. Unchanged partial osseous fusion across the C3-C4 disc space posteriorly. Soft tissues and spinal canal: Prevertebral fluid anterior to C4-T2. (Series 3, image 68). No visible canal hematoma. Disc levels: Degenerative changes in the cervical spine. No significant spinal canal stenosis. Upper chest: Small right pleural effusion. No focal pulmonary opacity. IMPRESSION: 1. Distraction injury at C6-C7, with fracture through the right anterior superior C7 endplate and widening of the anterior aspect of the disc space at C6-C7. Prevertebral fluid  anterior to C4-T2. 2. No acute intracranial process. 3. Small right pleural effusion. These findings were discussed by telephone on 02/05/2023 at 8:21 pm with provider PATRICK ROBINSON . Electronically Signed   By: Wiliam Ke M.D.   On: 02/05/2023 20:24    Scheduled Meds:  cholestyramine  4 g Oral BID   ferrous sulfate  325 mg Oral Q breakfast   FLUoxetine  20 mg Oral Daily   heparin  5,000 Units Subcutaneous Q8H   insulin aspart  0-6 Units Subcutaneous Q4H   [START ON 02/07/2023] insulin glargine-yfgn  5 Units Subcutaneous Daily   lactobacillus  1 g Oral TID WC   levothyroxine  50 mcg Oral QAC breakfast   midodrine  10 mg Oral TID WC   pantoprazole  40 mg Oral BID AC   pravastatin  40 mg Oral QHS   Continuous Infusions:  sodium chloride 75 mL/hr  at 02/06/23 0906   cefTRIAXone (ROCEPHIN)  IV Stopped (02/06/23 0146)   PRN Meds: acetaminophen, glucose, loperamide, morphine injection, ondansetron (ZOFRAN) IV  Time spent: 50 minutes  Author: Gillis Santa. MD [facility] 02/06/2023 12:04 PM  To reach On-call, see care teams to locate the attending and reach out to them via [email or web address]. If 7PM-7AM, please contact night-coverage If you still have difficulty reaching the attending provider, please page the [facility] (Director on Call) for [facility] on amion for assistance.

## 2023-02-06 NOTE — Inpatient Diabetes Management (Signed)
Inpatient Diabetes Program Recommendations  AACE/ADA: New Consensus Statement on Inpatient Glycemic Control   Target Ranges:  Prepandial:   less than 140 mg/dL      Peak postprandial:   less than 180 mg/dL (1-2 hours)      Critically ill patients:  140 - 180 mg/dL    Latest Reference Range & Units 02/05/23 20:49 02/06/23 07:23  Glucose-Capillary 70 - 99 mg/dL 161 (H) 096 (H)    Review of Glycemic Control  Diabetes history: DM1 hx (does not make any insulin; requires basal, correction, and carb coverage insulin) Outpatient Diabetes medications: Semglee 4 units QHS, Novolog 0-13 units TID with meals Current orders for Inpatient glycemic control: Semglee 20 units BID, Novolog 0-9 units TID with meals  Inpatient Diabetes Program Recommendations:    Insulin: Patient received a total of Novolog 19 units this morning (9 units at 8:08 am and 10 units at 8:27 am) and Semglee 20 units at 9:52 am. Concerned patient will experience hypoglycemia given that he has DM1 and is sensitive to insulin (one unit drops glucose 60 mg/dl). Please decrease Semglee to 5 units daily (to start 02/07/23), decrease Novolog correction to 0-6 units and change to Q4H.  May want to consider ordering CBGs Q1 hour for the next 4-6 hours to check for hypoglycemia.     NOTE: Noted consult for Diabetes Coordinator. Diabetes Coordinator is not on campus over the weekend but available by pager from 8am to 5pm for questions or concerns. Chart reviewed. Patient admitted from [facility] after fall with C-7 fx and UTI; hx of lymphoma. Patient has Type 1 DM and sees Dr. Gershon Crane for DM management; last seen on 12/02/22. Per office note on 12/02/22, patient was using [employer] insulin pump for DM control and settings were noted to be Basal rates Midnight = 0.675 6 AM = 0.8 12 PM = 0.75 9 PM = 0.775 TDD basal: 17.925 units  Bolus settings I/C: 9 ISF: 60 Target Glucose: 100-130 Active insulin time: 3 hours  Patient was  inpatient 4/2-4/15/24 and seen by inpatient diabetes coordinator on 01/13/23 during that admission and patient was discharged to [facility] at that time. Per chart, patient was last ordered Semglee 5 units QHS, Novolog correction TID for DM control.  Initial glucose 121 mg/dl on 0/45/40 at 98:11 am. No insulin given by hospital on 02/05/23 and CBG 496 mg/dl this morning at 9:14 am. Patient has received a total of Novolog 19 units and Semglee 20 units this morning. Concerned about hypoglycemia due to amount of Novolog and Semglee patient has been given this morning.   Thanks, Orlando Penner, RN, MSN, CDCES Diabetes Coordinator Inpatient Diabetes Program [phone] (Team Pager from 8am to 5pm)

## 2023-02-06 NOTE — ED Notes (Signed)
Pt is not swallowing well. Attempted the floranxex, and he immediately choked on it......Marland Kitchenhad to perform suction. Pt asked for the c collar to be removed. Pt stated he can not swallow well this morning. Messaged MD about all the concerns. Screen performed.

## 2023-02-06 NOTE — ED Notes (Signed)
Pt is not steady enough to stand...Gary KitchenMarland Kitchenthe patient stated he feels terribly weak

## 2023-02-06 NOTE — Evaluation (Signed)
Physical Therapy Co-Evaluation with OT/ST Patient Details Name: Gary [name] MRN: [id number] DOB: 04/21/1946 Today's Date: 02/06/2023  History of Present Illness  Gary [name] is a 77 y.o. male with medical history significant of   recurrent stage III mantle cell lymphoma s/p 43 cycles of Rituxan and Treanda, depression, insulin-dependent diabetes mellitus, hypertension, hyperlipidemia, who presents to ED with increase falls over the last week at [facility] nursing home. Pt had been discharged to [facility]) after hospital admission in mid April 2024. Prior to recent hospitalization he was living at home alone. He has experienced multiple falls over last month. Pt also reports increased difficulty swallowing over last month. Cervical X-ray reveals Distraction injury at C6-C7, with fracture through the right  anterior superior C7 endplate and widening of the anterior aspect of  the disc space at C6-C7. Prevertebral fluid anterior to C4-T2. Neurosurgery Dr Welton Flakes recommends cervical collar and out patient follow up.  Clinical Impression  77 yo Male admitted to hospital with recent fall at short term rehab [employer]). Patient has had a significant decline in health over last month. He has had multiple hospital admissions with recent Endoscopy/colonoscopy and was discharged to STR. He is brought to ED this admission after experiencing multiple falls and reporting increased weakness. Patient has complex medical history including lymphoma and reports increased difficulty swallowing with decline in intake over last several weeks. Upon admission, patient was diagnosed with C7 fracture and was given cervical collar and recommended to follow up outpatient by neurosurgery. He reports significant pain in neck and BUE shoulders. He required mod A +2 for bed mobility with cues for positioning, having a hard time achieving log roll technique due to neck/shoulder pain. Once sitting edge of bed, ST  assessed his swallow function. Patient able to sit edge of bed well with feet on floor and arms supported. He does exhibit significant forward flexed head. After ST, instructed patient to stand with RW and take a few steps towards head of bed for better positioning prior to laying back down. He required min A +2 for sit to stand transfer. Once standing, patient reports increased fear of falling with heavy posterior lean. Concerned about positional awareness as patient had difficulty shifting weight forward to regain balance. Unsure if loss of balance is from fear of falling and/or also possible decreased proprioceptive input. He required mod A +2 to get repositioned back in bed with increased pain. Patient would benefit from skilled PT intervention to improve mobility and strength.      Recommendations for follow up therapy are one component of a multi-disciplinary discharge planning process, led by the attending physician.  Recommendations may be updated based on patient status, additional functional criteria and insurance authorization.  Follow Up Recommendations Can patient physically be transported by private vehicle: No     Assistance Recommended at Discharge Frequent or constant Supervision/Assistance  Patient can return home with the following  Two people to help with walking and/or transfers;A lot of help with bathing/dressing/bathroom;Assistance with cooking/housework;Assist for transportation;Help with stairs or ramp for entrance;Direct supervision/assist for medications management;Direct supervision/assist for financial management    Equipment Recommendations Other (comment) (to be determined at next venue of care)  Recommendations for Other Services       Functional Status Assessment Patient has had a recent decline in their functional status and demonstrates the ability to make significant improvements in function in a reasonable and predictable amount of time.     Precautions /  Restrictions  Precautions Precautions: Fall;Cervical Precaution Comments: Cervical collar; C7 fracture Restrictions Weight Bearing Restrictions: No      Mobility  Bed Mobility Overal bed mobility: Needs Assistance Bed Mobility: Supine to Sit, Sit to Supine Rolling: Mod assist   Supine to sit: Max assist, +2 for physical assistance, HOB elevated Sit to supine: +2 for safety/equipment, Max assist, HOB elevated   General bed mobility comments: Required cues for log roll; Pt significantly limited due to high levels of pain. In addition to cervical pain, experiencing BUE shoulder pain from fall with limited tolerance for rolling. Unable to achieve log roll transition requiring +2 assist for supine<>sit.    Transfers Overall transfer level: Needs assistance Equipment used: Rolling walker (2 wheels) Transfers: Sit to/from Stand Sit to Stand: Min assist, From elevated surface, +2 physical assistance           General transfer comment: Required cues for hand placement; While standing edge of bed, required mod A +2 for balance with heavy posterior lean.    Ambulation/Gait Ambulation/Gait assistance: Mod assist, +2 physical assistance Gait Distance (Feet): 2 Feet Assistive device: Rolling walker (2 wheels)   Gait velocity: decreased     General Gait Details: pt took side steps to right to be in better position on bed. Required mod A +2 for balance and control. Hesitant to shift weight to take steps but once started stepping, able to continue stepping; increased posterior lean and significant fear of falling  Stairs            Wheelchair Mobility    Modified Rankin (Stroke Patients Only)       Balance Overall balance assessment: Needs assistance Sitting-balance support: Feet supported, Bilateral upper extremity supported Sitting balance-Leahy Scale: Fair     Standing balance support: Bilateral upper extremity supported Standing balance-Leahy Scale: Poor Standing  balance comment: Required mod A +2 for balance in standing with heavy posterior lean and high fear of falling;                             Pertinent Vitals/Pain Pain Assessment Pain Assessment: 0-10 Pain Score: 7  Pain Location: neck/shoulder pain Pain Descriptors / Indicators: Sore Pain Intervention(s): Limited activity within patient's tolerance, Monitored during session, Repositioned    Home Living Family/patient expects to be discharged to:: Private residence Living Arrangements: Alone Available Help at Discharge: Family;Home health;Available PRN/intermittently Type of Home: House Home Access: Ramped entrance       Home Layout: One level Home Equipment: Rollator (4 wheels);Cane - single point;Grab bars - toilet Additional Comments: Pt states his sister comes by 1x/week to bring groceries and help around the house. Pt has recent hospitalization mid April 2024 and was discharged to STR [facility]);    Prior Function Prior Level of Function : Needs assist;History of Falls (last six months)             Mobility Comments: Report multiple falls in last month with injury; using rollator for mobility; ADLs Comments: Per chart, pt was sponge bathing at baseline prior to STR stay, he was able to use [employer] for transportation to doctor's appointments, etc. he endorses his Sister checks in on him reguarly and supports with his IADL management.     Hand Dominance        Extremity/Trunk Assessment   Upper Extremity Assessment Upper Extremity Assessment: Generalized weakness    Lower Extremity Assessment Lower Extremity Assessment: Generalized weakness;Defer to PT evaluation  Cervical / Trunk Assessment Cervical / Trunk Assessment: Kyphotic;Other exceptions Cervical / Trunk Exceptions: In C-collar for cervical fx. Significant forward head/shoulders noted with upright positioning.  Communication   Communication: No difficulties  Cognition  Arousal/Alertness: Lethargic   Overall Cognitive Status: Within Functional Limits for tasks assessed                                 General Comments: Pt oriented to place/person and situation; Date not assessed; Pt limited due to pain        General Comments General comments (skin integrity, edema, etc.): Pt has significant forward flexed head; wearing rigid cervical collar but continues to have significant cervical pain; Pt has staples in posterior head, multiple abrasions on shoulders; has significant redness on BLE with bandaging on lower right leg;    Exercises     Assessment/Plan    PT Assessment Patient needs continued PT services  PT Problem List Decreased strength;Decreased range of motion;Decreased balance;Decreased activity tolerance;Decreased mobility;Decreased coordination;Decreased knowledge of use of DME;Decreased safety awareness;Pain       PT Treatment Interventions Gait training;Therapeutic activities;Therapeutic exercise;DME instruction;Functional mobility training;Balance training;Patient/family education;Neuromuscular re-education    PT Goals (Current goals can be found in the Care Plan section)  Acute Rehab PT Goals Patient Stated Goal: to get stronger PT Goal Formulation: With patient Time For Goal Achievement: 02/20/23 Potential to Achieve Goals: Fair    Frequency 7X/week     Co-evaluation PT/OT/SLP Co-Evaluation/Treatment: Yes Reason for Co-Treatment: Complexity of the patient's impairments (multi-system involvement);For patient/therapist safety;To address functional/ADL transfers PT goals addressed during session: Strengthening/ROM;Proper use of DME;Balance;Mobility/safety with mobility OT goals addressed during session: ADL's and self-care;Strengthening/ROM;Proper use of Adaptive equipment and DME SLP goals addressed during session: Swallowing     AM-PAC PT "6 Clicks" Mobility  Outcome Measure Help needed turning from your back to  your side while in a flat bed without using bedrails?: A Lot Help needed moving from lying on your back to sitting on the side of a flat bed without using bedrails?: A Lot Help needed moving to and from a bed to a chair (including a wheelchair)?: A Lot Help needed standing up from a chair using your arms (e.g., wheelchair or bedside chair)?: A Lot Help needed to walk in hospital room?: Total Help needed climbing 3-5 steps with a railing? : Total 6 Click Score: 10    End of Session Equipment Utilized During Treatment: Gait belt;Cervical collar Activity Tolerance: Patient limited by fatigue;Patient limited by pain Patient left: in bed;with call bell/phone within reach;with bed alarm set Nurse Communication: Mobility status;Precautions;Weight bearing status PT Visit Diagnosis: Muscle weakness (generalized) (M62.81);Difficulty in walking, not elsewhere classified (R26.2);Unsteadiness on feet (R26.81);Repeated falls (R29.6)    Time: 1350-1435 PT Time Calculation (min) (ACUTE ONLY): 45 min   Charges:   PT Evaluation $PT Eval Low Complexity: 1 Low           Caleigha Zale PT, DPT 02/06/2023, 3:09 PM

## 2023-02-06 NOTE — ED Notes (Signed)
MD and lab notified. Waiting on MD to advise on insulin order.

## 2023-02-06 NOTE — Evaluation (Signed)
Occupational Therapy Evaluation Patient Details Name: Gary [name] MRN: [id number] DOB: 1945-12-20 Today's Date: 02/06/2023   History of Present Illness Gary [name] is a 77 y.o. male with medical history significant of   recurrent stage III mantle cell lymphoma s/p 43 cycles of Rituxan and Treanda, depression, insulin-dependent diabetes mellitus, hypertension, hyperlipidemia, who presents to ED with increase falls over the last week at [facility] nursing home. Pt had been discharged to STR [facility]) after hospital admission in mid April 2024. Prior to recent hospitalization he was living at home alone. He has experienced multiple falls over last month. Pt also reports increased difficulty swallowing over last month. Cervical X-ray reveals Distraction injury at C6-C7, with fracture through the right  anterior superior C7 endplate and widening of the anterior aspect of  the disc space at C6-C7. Prevertebral fluid anterior to C4-T2. Neurosurgery Dr Welton Flakes recommends cervical collar and out patient follow up.   Clinical Impression   [name] was seen for OT evaluation this date. Prior to hospital admission, pt was was receiving rehab services at a STR facility. He reports he had been walking with a RW and working with therapy regularly at the facility. Pt presents to acute OT demonstrating impaired ADL performance and functional mobility 2/2 increased pain with mobility, decreased LB access, generalized weakness, and decreased activity tolerance (See OT problem list for additional functional deficits). Pt currently requires MAX A for bed level LB ADL management, MAX A +2 for bed mobility, MOD A +2 for STS t/fs and to take small steps at EOB.  Pt would benefit from skilled OT services to address noted impairments and functional limitations (see below for any additional details) in order to maximize safety and independence while minimizing falls risk and caregiver burden. Continue to anticipate the  need for follow up therapy services upon acute hospital DC (<3 hours/day).     Recommendations for follow up therapy are one component of a multi-disciplinary discharge planning process, led by the attending physician.  Recommendations may be updated based on patient status, additional functional criteria and insurance authorization.   Assistance Recommended at Discharge Frequent or constant Supervision/Assistance  Patient can return home with the following Two people to help with walking and/or transfers;Assistance with cooking/housework;Direct supervision/assist for medications management;Direct supervision/assist for financial management;Assist for transportation;Help with stairs or ramp for entrance;Two people to help with bathing/dressing/bathroom    Functional Status Assessment  Patient has had a recent decline in their functional status and demonstrates the ability to make significant improvements in function in a reasonable and predictable amount of time.  Equipment Recommendations  Other (comment) (defer)    Recommendations for Other Services       Precautions / Restrictions Precautions Precautions: Fall;Cervical Precaution Comments: Cervical collar; C7 fracture Restrictions Weight Bearing Restrictions: No      Mobility Bed Mobility Overal bed mobility: Needs Assistance Bed Mobility: Supine to Sit, Sit to Supine Rolling: Mod assist, +2 for physical assistance, Max assist   Supine to sit: Max assist, +2 for physical assistance, HOB elevated Sit to supine: +2 for safety/equipment, Max assist, HOB elevated, +2 for physical assistance   General bed mobility comments: Required cues for log roll; Pt significantly limited due to high levels of pain. In addition to cervical pain, experiencing BUE shoulder pain from fall with limited tolerance for rolling. Unable to achieve log roll transition requiring +2 assist for supine<>sit.    Transfers Overall transfer level: Needs  assistance Equipment used: Rolling walker (2  wheels) Transfers: Sit to/from Stand Sit to Stand: Min assist, From elevated surface, +2 physical assistance           General transfer comment: Required cues for hand placement; While standing edge of bed, required mod A +2 for balance with heavy posterior lean.      Balance Overall balance assessment: Needs assistance Sitting-balance support: Feet supported, Bilateral upper extremity supported Sitting balance-Leahy Scale: Fair Sitting balance - Comments: steady static sitting   Standing balance support: Bilateral upper extremity supported, Reliant on assistive device for balance, During functional activity Standing balance-Leahy Scale: Poor Standing balance comment: Required mod A +2 for balance in standing with heavy posterior lean and high fear of falling;                           ADL either performed or assessed with clinical judgement   ADL Overall ADL's : Needs assistance/impaired                     Lower Body Dressing: Maximal assistance Lower Body Dressing Details (indicate cue type and reason): MAX A to don bilat socks at bed level, pt able to raise both legs to assist with donning.             Functional mobility during ADLs: +2 for physical assistance;Moderate assistance;Cueing for safety;Cueing for sequencing;Rolling walker (2 wheels) General ADL Comments: Pt functionally limited by generalized weakness, decreased balance, decreased activity tolerance and significant fear of falling. He requires +2 MAX A for bed mobility, +2 MOD A for STS from EOB at elevated position with ongoing assistance to maintain upright positioning while taking side steps at EOB.     Vision Baseline Vision/History: 1 Wears glasses Ability to See in Adequate Light: 1 Impaired Patient Visual Report: No change from baseline       Perception     Praxis      Pertinent Vitals/Pain Pain Assessment Pain Assessment:  0-10 Pain Score: 7  Pain Location: neck/shoulder pain Pain Descriptors / Indicators: Sore, Grimacing, Guarding Pain Intervention(s): Limited activity within patient's tolerance, Monitored during session, Repositioned     Hand Dominance     Extremity/Trunk Assessment Upper Extremity Assessment Upper Extremity Assessment: Generalized weakness   Lower Extremity Assessment Lower Extremity Assessment: Generalized weakness;Defer to PT evaluation   Cervical / Trunk Assessment Cervical / Trunk Assessment: Kyphotic;Other exceptions Cervical / Trunk Exceptions: In C-collar for cervical fx. Significant forward head/shoulders noted with upright positioning.   Communication Communication Communication: No difficulties   Cognition Arousal/Alertness: Lethargic Behavior During Therapy: WFL for tasks assessed/performed Overall Cognitive Status: Within Functional Limits for tasks assessed                                 General Comments: Pt oriented to place/person and situation; Date not assessed; Pt limited due to pain, but able to follow 1 step VCs consistently during session.     General Comments  Pt has significant forward flexed head; wearing rigid cervical collar but continues to have significant cervical pain; Pt has staples in posterior head, multiple abrasions on shoulders; has significant redness on BLE with bandaging on lower right leg;    Exercises Other Exercises Other Exercises: Pt educated on safety, falls prevention, and safe transfer technique during session.   Shoulder Instructions      Home Living Family/patient expects to be discharged to:: Private  residence Living Arrangements: Alone Available Help at Discharge: Family;Home health;Available PRN/intermittently Type of Home: House Home Access: Ramped entrance     Home Layout: One level     Bathroom Shower/Tub: Sponge bathes at baseline   Bathroom Toilet: Handicapped height Bathroom Accessibility:  Yes How Accessible: Accessible via walker Home Equipment: Rollator (4 wheels);Cane - single point;Grab bars - toilet   Additional Comments: Pt states his sister comes by 1x/week to bring groceries and help around the house. Pt has recent hospitalization mid April 2024 and was discharged to [facility]      Prior Functioning/Environment Prior Level of Function : Needs assist;History of Falls (last six months)             Mobility Comments: Report multiple falls in last month with injury; using rollator for mobility; ADLs Comments: Per chart, pt was sponge bathing at baseline prior to STR stay, he was able to use [employer] for transportation to doctor's appointments, etc. he endorses his Sister checks in on him reguarly and supports with his IADL management.        OT Problem List: Decreased strength;Decreased activity tolerance;Impaired balance (sitting and/or standing);Decreased range of motion;Pain;Decreased knowledge of use of DME or AE;Decreased knowledge of precautions;Decreased safety awareness      OT Treatment/Interventions: Self-care/ADL training;Therapeutic exercise;Therapeutic activities    OT Goals(Current goals can be found in the care plan section) Acute Rehab OT Goals Patient Stated Goal: To feel better OT Goal Formulation: With patient Time For Goal Achievement: 02/20/23 Potential to Achieve Goals: Fair ADL Goals Pt Will Perform Grooming: sitting;with set-up;with supervision Pt Will Perform Lower Body Dressing: sit to/from stand;with adaptive equipment;with min assist Pt Will Transfer to Toilet: bedside commode;stand pivot transfer;with supervision;with set-up Pt Will Perform Toileting - Clothing Manipulation and hygiene: sit to/from stand;with adaptive equipment;with set-up;with supervision  OT Frequency: Min 1X/week    Co-evaluation PT/OT/SLP Co-Evaluation/Treatment: Yes Reason for Co-Treatment: Complexity of the patient's impairments  (multi-system involvement);For patient/therapist safety;To address functional/ADL transfers PT goals addressed during session: Strengthening/ROM;Proper use of DME;Balance;Mobility/safety with mobility OT goals addressed during session: ADL's and self-care;Strengthening/ROM;Proper use of Adaptive equipment and DME SLP goals addressed during session: Swallowing    AM-PAC OT "6 Clicks" Daily Activity     Outcome Measure Help from another person eating meals?: A Little Help from another person taking care of personal grooming?: A Little Help from another person toileting, which includes using toliet, bedpan, or urinal?: A Lot Help from another person bathing (including washing, rinsing, drying)?: A Lot Help from another person to put on and taking off regular upper body clothing?: A Little Help from another person to put on and taking off regular lower body clothing?: A Lot 6 Click Score: 15   End of Session Equipment Utilized During Treatment: Rolling walker (2 wheels) Nurse Communication: Mobility status  Activity Tolerance: Patient tolerated treatment well Patient left: in bed;with call bell/phone within reach;with bed alarm set  OT Visit Diagnosis: Unsteadiness on feet (R26.81);Repeated falls (R29.6)                Time: 1610-9604 OT Time Calculation (min): 45 min Charges:  OT General Charges $OT Visit: 1 Visit OT Evaluation $OT Eval Moderate Complexity: 1 Mod OT Treatments $Self Care/Home Management : 8-22 mins  Rockney Ghee, M.S., OTR/L 02/06/23, 3:17 PM

## 2023-02-06 NOTE — Evaluation (Signed)
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    Gastroparesis    Graves disease    Hypercholesterolemia    Hypertension    Mantle cell lymphoma (HCC) 07/2014   Peripheral neuropathy    Shingles    Past Surgical History:  Past Surgical History:  Procedure Laterality Date   CHOLECYSTECTOMY     COLONOSCOPY WITH PROPOFOL N/A 02/04/2023   Procedure: COLONOSCOPY WITH PROPOFOL;  Surgeon: [name], MD;  Location: [facility] ENDOSCOPY;  Service: Gastroenterology;  Laterality: N/A;   ESOPHAGOGASTRODUODENOSCOPY (EGD) WITH PROPOFOL N/A 01/18/2023   Procedure: ESOPHAGOGASTRODUODENOSCOPY (EGD) WITH PROPOFOL;  Surgeon: Jaynie Collins, DO;  Location: [name] ENDOSCOPY;  Service: Gastroenterology;  Laterality: N/A;   NEPHRECTOMY     right   PACEMAKER INSERTION N/A 12/07/2017   Procedure: INSERTION PACEMAKER DUAL CHAMBER INITIAL INSERT;  Surgeon: Marcina Millard, MD;  Location: [facility] ORS;  Service: Cardiovascular;  Laterality: N/A;   PORTA CATH INSERTION N/A 11/03/2017   Procedure: PORTA CATH INSERTION;  Surgeon: Renford Dills, MD;  Location: [facility] INVASIVE CV LAB;  Service: Cardiovascular;  Laterality: N/A;   HPI:  Pt is a 77 y.o. male with medical history significant of  recurrent stage III mantle cell lymphoma s/p 43 cycles of Rituxan and Treanda, depression, insulin-dependent diabetes mellitus, hypertension, hyperlipidemia, who presents to ED with increase falls over the last week at [facility] nursing home. Pt had been discharged to [facility]) after  hospital admission in mid April 2024. Prior to recent hospitalization, he was living at home alone. He had experienced multiple falls over last month. Pt also reports decreased oral intake and difficulty swallowing over last 1-2 months(see previous ST BSE last admit indicating Esophageal phase Dysmtolity and Regurgitation).   OF NOTE: Cervical X-ray reveals Distraction injury at C6-C7, with fracture through the right  anterior superior C7 endplate and widening of the anterior aspect of  the disc space at C6-C7. Prevertebral fluid anterior to C4-T2. Neurosurgery Dr Welton Flakes recommends cervical collar and out patient follow up.   CXR at admit: Chronic interstitial changes consistent with fibrosis. No evidence  of active pulmonary disease.    Assessment / Plan / Recommendation  Clinical Impression   Pt seen today for BSE. Pt sitting up on EOB w/ OT/PT therapies supporting during their session. Pt was alert, cooperative, but weak-appearing t/o eval. Pt has recently reported nausea and loss of appetite w/ foods in past several months. Pt reported events of nausea/Regurgitation worse w/ some foods/drinks than others. Noted EGD 01/29/2023 indicating gastritis and hiatal hernia. Pt verbalized minimal improvement in past 1-2 weeks. Per H&P note, Sister reported "concerned about his lack of appetite".  Pt on RA, afebrile, wbc 1.9.  OF NOTE: Cervical X-ray reveals Distraction injury at C6-C7, with fracture through the right  anterior superior C7 endplate and widening of the anterior aspect of  the disc space at C6-C7. Prevertebral fluid anterior to C4-T2. The neck brace restricts and isolates head movements during swallowing, oral intake.   Pt presents w/ concern for pharyngeal phase dysphagia as well as oral phase deficits of  bolus management in setting of Edentulous status(recently wore Dentures last admit). NSG reported swallowing concerns this morning during oral intake, coughing w/ liquids. Pt's head/neck are  restricted during oral intake and swallowing d/t presence of neck brace. During trials of a modified diet consistency, pt exhibited adequate oropharyngeal phase swallowing w/ No overt neuromuscular deficits noted w/ Nectar liquids and purees. Pt consumed these po trials w/ No overt, clinical s/s of aspiration during the trials. Pt appears at reduced risk for aspiration following general aspiration precautions and using a modified diet consistency at this time.   Pt has challenging factors that could impact oropharyngeal swallowing as mentioned above (neck brace) and also Esophageal phase Dysmotility (see notes last admit, EDG) as well as significantly deconditioned State and reduced oral intake overall in recent weeks. He has pain/discomfort when positioning upright and decreased overall movements and self-feeding abilities. These factors can increase risk for aspiration, dysphagia as well as decreased oral intake overall.   During po trials of single ice chips, Nectar liquids, and purees, pt consumed all consistencies w/ no overt coughing, decline in vocal quality, or change in respiratory presentation during/post trials. O2 sats remained in mid-upper 90s. Oral phase appeared [facility] w/ timely bolus management and control of bolus propulsion for A-P transfer for swallowing. Oral clearing achieved w/ all trial consistencies. OM Exam appeared [facility] w/ no unilateral weakness noted. Speech Clear, low volume. Pt fed self w/ setup support when drinking liquids.  Recommend a Dysphagia level 1 diet consistency w/ well-moistened foods; Nectar liquids. Strict monitoring of positioning for comfort and safety for oral intake. Aspiration precautions; Reflux precautions. Pt should help to Hold Cup when drinking. Feeding support as needed during meals. Rest Breaks when needed. Pills WHOLE vs CRUSHED in Puree for safer, easier swallowing -- NSG to monitor.  Education given on Pills in Puree; food consistencies and easy to eat  options today; aspiration precautions and positioning to pt. NSG updated. ST services will f/u w/ dysphagia tx/assessment and trials to upgrade diet consistency as safe, appropriate for pt.  MD updated via secure chat. Recommend Dietician f/u for support. SLP Visit Diagnosis: Dysphagia, oropharyngeal phase (R13.12) (restriction from neck collar; acute trauma/illness/deconditioning)    Aspiration Risk  Mild aspiration risk;Moderate aspiration risk;Risk for inadequate nutrition/hydration (Esophageal phase)    Diet Recommendation   Dysphagia level 1 diet consistency w/ well-moistened foods; Nectar liquids. Strict monitoring of positioning for comfort and safety for oral intake. Aspiration precautions; Reflux precautions. Pt should help to Hold Cup when drinking. Feeding support as needed during meals. Rest Breaks when needed.  Medication Administration: Whole meds with puree (vs need to Crush in puree)    Other  Recommendations Recommended Consults:  (Dietician f/u) Oral Care Recommendations: Oral care BID;Oral care before and after PO;Staff/trained caregiver to provide oral care    Recommendations for follow up therapy are one component of a multi-disciplinary discharge planning process, led by the attending physician.  Recommendations may be updated based on patient status, additional functional criteria and insurance authorization.  Follow up Recommendations Follow physician's recommendations for discharge plan and follow up therapies      Assistance Recommended at Discharge  full  Functional Status Assessment Patient has had a recent decline in their functional status and demonstrates the ability to make significant improvements in function in a reasonable and predictable amount of time.  Frequency and Duration min 3x week  2 weeks       Prognosis Prognosis for improved oropharyngeal function: Fair Barriers  to Reach Goals: Time post onset;Severity of  deficits;Motivation Barriers/Prognosis Comment: restriction from C7 fx and neck collar; acute trauma/illness/deconditioning      Swallow Study   General Date of Onset: 02/05/23 HPI: Pt is a 77 y.o. male with medical history significant of  recurrent stage III mantle cell lymphoma s/p 43 cycles of Rituxan and Treanda, depression, insulin-dependent diabetes mellitus, hypertension, hyperlipidemia, who presents to ED with increase falls over the last week at [facility] nursing home. Pt had been discharged to [facility]) after hospital admission in mid April 2024. Prior to recent hospitalization, he was living at home alone. He had experienced multiple falls over last month. Pt also reports decreased oral intake and difficulty swallowing over last 1-2 months(see previous ST BSE last admit indicating Esophageal phase Dysmtolity and Regurgitation).  OF NOTE: Cervical X-ray reveals Distraction injury at C6-C7, with fracture through the right  anterior superior C7 endplate and widening of the anterior aspect of  the disc space at C6-C7. Prevertebral fluid anterior to C4-T2. Neurosurgery Dr Welton Flakes recommends cervical collar and out patient follow up.   CXR at admit: Chronic interstitial changes consistent with fibrosis. No evidence  of active pulmonary disease. Type of Study: Bedside Swallow Evaluation Previous Swallow Assessment: BSE 01/14/2023 - regular diet w/ thins, REFLUX precs Diet Prior to this Study: Regular;Thin liquids (Level 0) (NSG reported difficulty swallowing this morning) Temperature Spikes Noted: No (wbc 1.9) Respiratory Status: Room air History of Recent Intubation: No Behavior/Cognition: Alert;Cooperative;Pleasant mood;Requires cueing (few) Oral Cavity Assessment: Dry Oral Care Completed by SLP: Recent completion by staff Oral Cavity - Dentition: Edentulous (dentures not present) Vision: Functional for self-feeding Self-Feeding Abilities: Able to feed self;Needs assist;Needs  set up;Total assist (very weak) Patient Positioning: Upright in bed (sitting on EOB w/ therapies) Baseline Vocal Quality: Normal;Low vocal intensity Volitional Cough:  (Fair) Volitional Swallow: Able to elicit    Oral/Motor/Sensory Function Overall Oral Motor/Sensory Function: Within functional limits   Ice Chips Ice chips: Within functional limits Presentation: Spoon (fed; 2 trials)   Thin Liquid Thin Liquid: Not tested Other Comments: reported difficulty this AM w/ NSG    Nectar Thick Nectar Thick Liquid: Within functional limits Presentation: Cup;Self Fed;Spoon (5 via tsp; 3 via cup) Other Comments: fatigued and wanted to lie down   Honey Thick Honey Thick Liquid: Not tested   Puree Puree: Within functional limits Presentation: Spoon (fed; 8 trials) Other Comments: fatigued and wanted to lie down   Solid     Solid: Not tested        Jerilynn Som, MS, CCC-SLP Speech Language Pathologist Rehab Services; [facility] [phone] (ascom) Rhian Funari 02/06/2023,4:27 PM

## 2023-02-06 NOTE — ED Notes (Signed)
Advised nurse that patient has ready bed 

## 2023-02-06 NOTE — ED Notes (Signed)
Rolled pt and removed all clothing, changed all linens, stored belongings in a pt belongings bag at bedside

## 2023-02-07 DIAGNOSIS — W19XXXA Unspecified fall, initial encounter: Secondary | ICD-10-CM | POA: Diagnosis not present

## 2023-02-07 DIAGNOSIS — Z7189 Other specified counseling: Secondary | ICD-10-CM

## 2023-02-07 DIAGNOSIS — S12601A Unspecified nondisplaced fracture of seventh cervical vertebra, initial encounter for closed fracture: Secondary | ICD-10-CM | POA: Diagnosis not present

## 2023-02-07 LAB — CBC WITH DIFFERENTIAL/PLATELET
Abs Immature Granulocytes: 0.07 10*3/uL (ref 0.00–0.07)
Basophils Absolute: 0 10*3/uL (ref 0.0–0.1)
Basophils Relative: 1 %
Eosinophils Absolute: 0.2 10*3/uL (ref 0.0–0.5)
Eosinophils Relative: 10 %
HCT: 26.7 % — ABNORMAL LOW (ref 39.0–52.0)
Hemoglobin: 8.3 g/dL — ABNORMAL LOW (ref 13.0–17.0)
Immature Granulocytes: 4 %
Lymphocytes Relative: 32 %
Lymphs Abs: 0.6 10*3/uL — ABNORMAL LOW (ref 0.7–4.0)
MCH: 29.5 pg (ref 26.0–34.0)
MCHC: 31.1 g/dL (ref 30.0–36.0)
MCV: 95 fL (ref 80.0–100.0)
Monocytes Absolute: 0.4 10*3/uL (ref 0.1–1.0)
Monocytes Relative: 25 %
Neutro Abs: 0.5 10*3/uL — ABNORMAL LOW (ref 1.7–7.7)
Neutrophils Relative %: 28 %
Platelets: 112 10*3/uL — ABNORMAL LOW (ref 150–400)
RBC: 2.81 MIL/uL — ABNORMAL LOW (ref 4.22–5.81)
RDW: 15.7 % — ABNORMAL HIGH (ref 11.5–15.5)
Smear Review: NORMAL
WBC: 1.7 10*3/uL — ABNORMAL LOW (ref 4.0–10.5)
nRBC: 0 % (ref 0.0–0.2)

## 2023-02-07 LAB — GLUCOSE, CAPILLARY
Glucose-Capillary: 122 mg/dL — ABNORMAL HIGH (ref 70–99)
Glucose-Capillary: 107 mg/dL — ABNORMAL HIGH (ref 70–99)
Glucose-Capillary: 85 mg/dL (ref 70–99)
Glucose-Capillary: 114 mg/dL — ABNORMAL HIGH (ref 70–99)
Glucose-Capillary: 157 mg/dL — ABNORMAL HIGH (ref 70–99)
Glucose-Capillary: 124 mg/dL — ABNORMAL HIGH (ref 70–99)

## 2023-02-07 LAB — IRON AND TIBC
Iron: 53 ug/dL (ref 45–182)
Saturation Ratios: 40 % — ABNORMAL HIGH (ref 17.9–39.5)
TIBC: 133 ug/dL — ABNORMAL LOW (ref 250–450)
UIBC: 80 ug/dL

## 2023-02-07 LAB — BASIC METABOLIC PANEL
Anion gap: 6 (ref 5–15)
BUN: 17 mg/dL (ref 8–23)
CO2: 28 mmol/L (ref 22–32)
Calcium: 7.4 mg/dL — ABNORMAL LOW (ref 8.9–10.3)
Chloride: 104 mmol/L (ref 98–111)
Creatinine, Ser: 1.42 mg/dL — ABNORMAL HIGH (ref 0.61–1.24)
GFR, Estimated: 52 mL/min — ABNORMAL LOW (ref >60)
Glucose, Bld: 96 mg/dL (ref 70–99)
Potassium: 4.1 mmol/L (ref 3.5–5.1)
Sodium: 138 mmol/L (ref 135–145)

## 2023-02-07 LAB — MAGNESIUM: Magnesium: 1.6 mg/dL — ABNORMAL LOW (ref 1.7–2.4)

## 2023-02-07 LAB — URINE CULTURE: Culture: <10000 — AB

## 2023-02-07 LAB — VITAMIN D 25 HYDROXY (VIT D DEFICIENCY, FRACTURES): Vit D, 25-Hydroxy: 74.11 ng/mL (ref 30–100)

## 2023-02-07 LAB — PHOSPHORUS: Phosphorus: 3.1 mg/dL (ref 2.5–4.6)

## 2023-02-07 MED ORDER — MAGNESIUM SULFATE 2 GM/50ML IV SOLN
2.0000 g | Freq: Once | INTRAVENOUS | Status: AC
  Administered 2023-02-07: 2 g via INTRAVENOUS
  Filled 2023-02-07: qty 50

## 2023-02-07 MED ORDER — FOLIC ACID 1 MG PO TABS
1.0000 mg | ORAL_TABLET | Freq: Every day | ORAL | Status: DC
  Administered 2023-02-07 – 2023-02-17 (×10): 1 mg via ORAL
  Filled 2023-02-07 (×10): qty 1

## 2023-02-07 MED ORDER — LEVOTHYROXINE SODIUM 100 MCG PO TABS
100.0000 ug | ORAL_TABLET | Freq: Every day | ORAL | Status: DC
  Administered 2023-02-08: 100 ug via ORAL
  Filled 2023-02-07: qty 1

## 2023-02-07 MED ORDER — CHLORHEXIDINE GLUCONATE CLOTH 2 % EX PADS
6.0000 | MEDICATED_PAD | Freq: Every day | CUTANEOUS | Status: DC
  Administered 2023-02-07 – 2023-02-16 (×9): 6 via TOPICAL

## 2023-02-07 NOTE — Progress Notes (Addendum)
Physical Therapy Treatment Patient Details Name: Gary [name] MRN: [id number] DOB: 07-Oct-1946 Today's Date: 02/07/2023   History of Present Illness Gary [name] is a 77 y.o. male with medical history significant of   recurrent stage III mantle cell lymphoma s/p 43 cycles of Rituxan and Treanda, depression, insulin-dependent diabetes mellitus, hypertension, hyperlipidemia, who presents to ED with increase falls over the last week at [facility] nursing home. Pt had been discharged to [facility]) after hospital admission in mid April 2024. Prior to recent hospitalization he was living at home alone. He has experienced multiple falls over last month. Pt also reports increased difficulty swallowing over last month. Cervical X-ray reveals Distraction injury at C6-C7, with fracture through the right  anterior superior C7 endplate and widening of the anterior aspect of  the disc space at C6-C7. Prevertebral fluid anterior to C4-T2. Neurosurgery Dr Welton Flakes recommends cervical collar and out patient follow up.    PT Comments    Pt stated he doesn't feel well today but willing to try.  Generally comfy in supine and sitting but does have increased pain in transitions but calms once still.  He is able to sit unsupported x 10 minutes before asking to lay back down.  Returned to supine with mod/max a x 1 due to pain.  Repositioned with max a x 2.  OOB and standing deferred. Unable to lateral scoot in sitting despite assist.   Recommendations for follow up therapy are one component of a multi-disciplinary discharge planning process, led by the attending physician.  Recommendations may be updated based on patient status, additional functional criteria and insurance authorization.  Follow Up Recommendations  Can patient physically be transported by private vehicle: No    Assistance Recommended at Discharge Frequent or constant Supervision/Assistance  Patient can return home with the following Two people  to help with walking and/or transfers;A lot of help with bathing/dressing/bathroom;Assistance with cooking/housework;Assist for transportation;Help with stairs or ramp for entrance;Direct supervision/assist for medications management;Direct supervision/assist for financial management   Equipment Recommendations       Recommendations for Other Services       Precautions / Restrictions Precautions Precautions: Fall;Cervical Precaution Comments: Cervical collar; C7 fracture Restrictions Weight Bearing Restrictions: No     Mobility  Bed Mobility Overal bed mobility: Needs Assistance Bed Mobility: Supine to Sit, Sit to Supine     Supine to sit: Mod assist Sit to supine: Max assist        Transfers                   General transfer comment: deferred as +1 avaialable    Ambulation/Gait                   Stairs             Wheelchair Mobility    Modified Rankin (Stroke Patients Only)       Balance Overall balance assessment: Needs assistance Sitting-balance support: Feet supported, Bilateral upper extremity supported Sitting balance-Leahy Scale: Fair Sitting balance - Comments: steady static sitting                                    Cognition Arousal/Alertness: Awake/alert Behavior During Therapy: WFL for tasks assessed/performed Overall Cognitive Status: Within Functional Limits for tasks assessed  Exercises Other Exercises Other Exercises: sitting EOB x 10 minutes with no LOB and generally steady    General Comments        Pertinent Vitals/Pain Pain Assessment Pain Assessment: Faces Faces Pain Scale: Hurts whole lot Pain Location: with transitions but 2/10 at rest or sitting Pain Descriptors / Indicators: Sore, Grimacing, Guarding Pain Intervention(s): Limited activity within patient's tolerance, Monitored during session, Repositioned    Home Living                           Prior Function            PT Goals (current goals can now be found in the care plan section) Progress towards PT goals: Progressing toward goals    Frequency    7X/week      PT Plan Current plan remains appropriate    Co-evaluation              AM-PAC PT "6 Clicks" Mobility   Outcome Measure  Help needed turning from your back to your side while in a flat bed without using bedrails?: A Lot Help needed moving from lying on your back to sitting on the side of a flat bed without using bedrails?: A Lot Help needed moving to and from a bed to a chair (including a wheelchair)?: A Lot Help needed standing up from a chair using your arms (e.g., wheelchair or bedside chair)?: A Lot Help needed to walk in hospital room?: Total Help needed climbing 3-5 steps with a railing? : Total 6 Click Score: 10    End of Session Equipment Utilized During Treatment: Gait belt;Cervical collar Activity Tolerance: Patient limited by fatigue;Patient limited by pain Patient left: in bed;with call bell/phone within reach;with bed alarm set Nurse Communication: Mobility status;Precautions;Weight bearing status PT Visit Diagnosis: Muscle weakness (generalized) (M62.81);Difficulty in walking, not elsewhere classified (R26.2);Unsteadiness on feet (R26.81);Repeated falls (R29.6)     Time: 7829-5621 PT Time Calculation (min) (ACUTE ONLY): 18 min  Charges:  $Therapeutic Activity: 8-22 mins                   Danielle Dess, PTA 02/07/23, 12:53 PM

## 2023-02-07 NOTE — Progress Notes (Signed)
[facility] Progress Note  Patient: Gary [name]    ZOX:[id number]  DOA: 02/05/2023     Date of Service: the patient was seen and examined on 02/07/2023  Chief Complaint  Patient presents with   Fall   Brief hospital course: LIEM COPENHAVER is a 77 y.o. male with medical history significant of  recurrent stage III mantle cell lymphoma s/p 43 cycles of Rituxan and Treanda, depression, insulin-dependent diabetes mellitus, hypertension, hyperlipidemia, who presents to ED with increase falls over the last week at [facility]. Patient of note has interim history of admission 01/12/2023-01/25/23 at which time patient was treated for  Sepsis due to cellulitis, as well as AKI. Patient at that time had presented to ED with generalized weakness and hypotension.  Patient also has interim history of colonoscopy on 02/04/23 with results noting polyp descending colon/hemorrhoids otherwise normal :Patient currently states he was attempting to go to the bathroom and his leg gave out on him and he was not able to use his arms to hold himself up due to being weak. Sister also aides in history she is concerned about current rehab has has had 3 falls since being admitted on 4/15. She is also concerned about his lack of appetite. She also note him picking at the air when he sleeps which is new for him and she is concerned about medication side-effect. Patient notes no fever/chills/, but does have nausea , no current abdominal pain or chest pain.   ED Course:  Afeb, bp 121/58, HR78, rr 18 sat 100%  UA : LE Moderate, bacteria rare CTH/Cervical spine IMPRESSION: 1. Distraction injury at C6-C7, with fracture through the right anterior superior C7 endplate and widening of the anterior aspect of the disc space at C6-C7. Prevertebral fluid anterior to C4-T2. 2. No acute intracranial process. 3. Small right pleural effusion.   Cxr: IMPRESSION: Chronic interstitial changes consistent with fibrosis. No evidence of active  pulmonary disease.   Wbc 2.4, hgb 9.7 at baseline,plt 110 Inr 1.4  NA 136, K 4.2, glu 155,  Xray pelvis IMPRESSION: Osteopenia. No displaced fractures are visualized. If there is high clinical concern for hip fracture recommend dedicated hip views.   EKG ventricular paced    UA + wbc , LE   Tx tylenol, ultram    Assessment and Plan:  # Fracture of C7 endplate s/p Fall due to unknown reason Prevertebral fluid anterior to C4-T2 -case discussed with neurosurgery Dr Welton Flakes who rec cervical collar and out patient follow up  -supportive pain regimen  -Fall in setting weakness / low volume from chronic diarrhea  Check orthostatic vital signs Continue fall precautions, ambulate with assistance, turn patient every 2 hours Continue aspiration precautions -PT/OT evaluate discharge needs  Follow SLP eval for dysphagia and aspiration    # UTI- -f/u culture data - continue with CTX    # AKI, baseline creatinine 1.03 on 01/1523 4/28 Creatinine 1.42 Monitor renal functions daily Bladder scan 94, ruled out urinary retention Continue IV fluid for hydration    Hypotension, persistent -requiring midodrine  Recurrent stage III mantle cell lymphoma  -s/p 43 cycles of Rituxan and Treanda -Last chemo was in January 2024 -pancytopenia, due to malignancy, stable, no fever. -followed by oncology as outpatient Dr Orlie Dakin    Folic acid deficiency, folate level 3.3, started folic acid 1 mg p.o. daily Anemia most likely secondary to chronic disease and underlying malignancy. Iron profile and B12 level within normal range Monitor H&H and transfuse if hemoglobin less  than 7  Chronic diarrhea  -began with chemo  -followed by GI  -recent c-scope 4/25 noted only polyp and hemorrhoids  -possible cause of patient falls due to orthostasis    Depression -continue ssri    Type 1 diabetes mellitus  Uncontrolled blood glucose could be due to lack of insulin use Diabetic coordinator  consulted, Started Semglee 5 units daily and NovoLog sliding scale, monitor CBG Continue diabetic diet 4/28 CBGs low today, held Semglee   Hyperlipidemia -continue statin   Hypomagnesemia, mag repleted. Monitor electrolytes and replete as needed.   Hypothyroid, patient was on Synthroid 50 mcg p.o. daily TSH 5.8, elevated, increased Synthroid from 50 to 100 mcg Repeat TSH after 4 to 6 weeks and follow with PCP to titrate dose accordingly   Right lower extremity chronic wound and bilateral lower extremity pigmentation and dry skin Wound care RN consulted Started Lotrisone cream BID  Body mass index is 29.69 kg/m.  Interventions:   Pressure Injury 01/12/23 Buttocks Right;Left Stage 2 -  Partial thickness loss of dermis presenting as a shallow open injury with a red, pink wound bed without slough. (Active)  01/12/23 2330  Location: Buttocks  Location Orientation: Right;Left  Staging: Stage 2 -  Partial thickness loss of dermis presenting as a shallow open injury with a red, pink wound bed without slough.  Wound Description (Comments):   Present on Admission: Yes  Dressing Type Foam - Lift dressing to assess site every shift 02/07/23 0800     Pressure Injury 01/14/23 Coccyx Bilateral Deep Tissue Pressure Injury - Purple or maroon localized area of discolored intact skin or blood-filled blister due to damage of underlying soft tissue from pressure and/or shear. 4 DTI areas noted (Active)  01/14/23 1751  Location: Coccyx  Location Orientation: Bilateral  Staging: Deep Tissue Pressure Injury - Purple or maroon localized area of discolored intact skin or blood-filled blister due to damage of underlying soft tissue from pressure and/or shear.  Wound Description (Comments): 4 DTI areas noted  Present on Admission:   Dressing Type Gauze (Comment) 02/07/23 0800     Diet: Diabetic diet DVT Prophylaxis: Subcutaneous Heparin    Advance goals of care discussion: Full code  Family  Communication: family was not present at bedside, at the time of interview.  The pt provided permission to discuss medical plan with the family. Opportunity was given to ask question and all questions were answered satisfactorily.   Disposition:  Pt is from [facility], admitted with fall and C7 Fx and uncontrolled DM, still has C-collar and high CBG, which precludes a safe discharge. Discharge to SNF, when clinically stable, may need 1-2 more days to stay..  Subjective: No significant events overnight, patient was resting comfortably in the bed, denied any active issues, pain is well-controlled.  No chest pain or palpitation, no shortness of breath, no abdominal pain.  Physical Exam: General: NAD, lying comfortably Appear in no distress, affect appropriate Eyes: PERRLA ENT: Oral Mucosa Clear, moist,  Neck: no JVD, cervical collar intact Cardiovascular: S1 and S2 Present, no Murmur,  Respiratory: good respiratory effort, Bilateral Air entry equal and Decreased, no Crackles, no wheezes Abdomen: Bowel Sound present, Soft and no tenderness,  Skin: no rashes Extremities: Mild edema, right lower extremity chronic wound, dry skin and chronic pigmentation bilateral lower extremities.   Neurologic: without any new focal findings Gait not checked due to patient safety concerns  Vitals:   02/07/23 0008 02/07/23 0105 02/07/23 0411 02/07/23 0738  BP: (!) 95/48 Marland Kitchen)  93/53 93/61 (!) 108/50  Pulse: 82 81 79 80  Resp: 16  20 16   Temp: 97.9 F (36.6 C)  98.6 F (37 C) 97.7 F (36.5 C)  TempSrc:      SpO2: 97%  98% 96%  Weight:      Height:        Intake/Output Summary (Last 24 hours) at 02/07/2023 1132 Last data filed at 02/07/2023 8119 Gross per 24 hour  Intake 2241.97 ml  Output 80 ml  Net 2161.97 ml   Filed Weights   02/05/23 1905 02/06/23 1044  Weight: 96.6 kg 91.2 kg    Data Reviewed: I have personally reviewed and interpreted daily labs, tele strips, imagings as discussed above. I  reviewed all nursing notes, pharmacy notes, vitals, pertinent old records I have discussed plan of care as described above with RN and patient/family.  CBC: Recent Labs  Lab 02/02/23 1053 02/05/23 2053 02/06/23 0100 02/07/23 0614  WBC 3.0* 2.4* 1.9* 1.7*  NEUTROABS 1.2* 0.5*  --  0.5*  HGB 9.9* 9.7* 8.8* 8.3*  HCT 31.3* 31.3* 28.8* 26.7*  MCV 92.9 94.8 96.0 95.0  PLT 120* 110* 116* 112*   Basic Metabolic Panel: Recent Labs  Lab 02/02/23 1053 02/05/23 2053 02/06/23 0100 02/06/23 0108 02/07/23 0614  NA 133* 136  --  135 138  K 4.4 4.2  --  4.3 4.1  CL 97* 98  --  98 104  CO2 26 31  --  25 28  GLUCOSE 93 155*  --  234* 96  BUN 20 14  --  14 17  CREATININE 1.84* 1.12 1.22 1.16 1.42*  CALCIUM 7.8* 7.4*  --  7.4* 7.4*  MG  --   --   --  1.4* 1.6*  PHOS  --   --   --  2.8 3.1    Studies: No results found.  Scheduled Meds:  Chlorhexidine Gluconate Cloth  6 each Topical Daily   cholestyramine  4 g Oral BID   clotrimazole-betamethasone   Topical BID   ferrous sulfate  325 mg Oral Q breakfast   FLUoxetine  20 mg Oral Daily   folic acid  1 mg Oral Daily   heparin  5,000 Units Subcutaneous Q8H   insulin aspart  0-6 Units Subcutaneous Q4H   insulin glargine-yfgn  5 Units Subcutaneous Daily   lactobacillus  1 g Oral TID WC   [START ON 02/08/2023] levothyroxine  100 mcg Oral QAC breakfast   midodrine  10 mg Oral TID WC   pantoprazole  40 mg Oral BID AC   pravastatin  40 mg Oral QHS   Continuous Infusions:  sodium chloride Stopped (02/07/23 0911)   cefTRIAXone (ROCEPHIN)  IV Stopped (02/07/23 0126)   PRN Meds: acetaminophen, glucose, loperamide, morphine injection, ondansetron (ZOFRAN) IV  Time spent: 50 minutes  Author: Gillis Santa. MD [facility] 02/07/2023 11:32 AM  To reach On-call, see care teams to locate the attending and reach out to them via [email or web address]. If 7PM-7AM, please contact night-coverage If you still have difficulty reaching the attending  provider, please page the [facility] (Director on Call) for [facility] on amion for assistance.

## 2023-02-07 NOTE — Progress Notes (Signed)
BP at midnight was 95/48 and recheck was 93/53 MAP 67 , patient denies symptoms. Has NS @75mL /hr   RN notified Manuela Schwartz, NP of the above information per the order to notify provider if DBP <60

## 2023-02-07 NOTE — Progress Notes (Signed)
Speech Language Pathology Treatment: Dysphagia  Patient Details Name: Gary [name] MRN: [id number] DOB: [birth year] Today's Date: 02/07/2023 Time: 4696-2952 SLP Time Calculation (min) (ACUTE ONLY): 15 min  Assessment / Plan / Recommendation Clinical Impression  Pt seen for diet tolerance and trials of upgraded textures. Spoke with NT who noted pt with poor PO intake due to dislike of pureed consistencies and nectar-thick liquids.   Pt given trials of solid, puree, and thin liquids (via straw). Pt presents with s/sx mild-moderate oral dysphagia c/b prolonged and inefficient mastication of solids and trace lingual residual which was likely due to dental status and deconditioned state as well as limited mandibular excursion with [place] J collar donned. Pharyngeally, no overt s/sx pharyngeal dysphagia appreciated. No change to vocal quality. Pt able to feed self with intermittent assistance due to reduced UE ROM.   Recommend diet upgrade to Dysphagia 2 Diet with Thin Liquids with safe swallowing strategies/aspiration precautions/ reflux precautions as outlined below.   SLP to f/u for ongoing dysphagia management.   Pt and RN made ware of results, recommendations, and SLP POC. Pt verbalized understanding/agreement. Reinforcement of content may be needed.    HPI HPI: Pt is a 77 y.o. male with medical history significant of  recurrent stage III mantle cell lymphoma s/p 43 cycles of Rituxan and Treanda, depression, insulin-dependent diabetes mellitus, hypertension, hyperlipidemia, who presents to ED with increase falls over the last week at [facility] nursing home. Pt had been discharged to [facility]) after hospital admission in mid April 2024. Prior to recent hospitalization, he was living at home alone. He had experienced multiple falls over last month. Pt also reports decreased oral intake and difficulty swallowing over last 1-2 months(see previous ST BSE last admit indicating Esophageal  phase Dysmtolity and Regurgitation).  OF NOTE: Cervical X-ray reveals Distraction injury at C6-C7, with fracture through the right  anterior superior C7 endplate and widening of the anterior aspect of  the disc space at C6-C7. Prevertebral fluid anterior to C4-T2. Neurosurgery Dr Welton Flakes recommends cervical collar and out patient follow up.   CXR at admit: Chronic interstitial changes consistent with fibrosis. No evidence  of active pulmonary disease.      SLP Plan  Continue with current plan of care      Recommendations for follow up therapy are one component of a multi-disciplinary discharge planning process, led by the attending physician.  Recommendations may be updated based on patient status, additional functional criteria and insurance authorization.    Recommendations  Diet recommendations: Dysphagia 2 (fine chop);Thin liquid Medication Administration: Whole meds with puree (vs crushed) Supervision: Patient able to self feed;Staff to assist with self feeding (allow pt to feed self as much as possible) Compensations: Slow rate;Small sips/bites Postural Changes and/or Swallow Maneuvers: Seated upright 90 degrees;Upright 30-60 min after meal                  Oral care BID;Oral care before and after PO;Staff/trained caregiver to provide oral care     Dysphagia, oral phase (R13.11)     Continue with current plan of care    [name], M.S., CCC-SLP Speech-Language Pathologist [facility] [phone] [name])  [name]  02/07/2023, 1:06 PM

## 2023-02-07 NOTE — Plan of Care (Signed)
  Problem: Fluid Volume: Goal: Hemodynamic stability will improve Outcome: Progressing   Problem: Clinical Measurements: Goal: Signs and symptoms of infection will decrease Outcome: Progressing   Problem: Respiratory: Goal: Ability to maintain adequate ventilation will improve Outcome: Progressing   Problem: Cardiac: Goal: Ability to maintain an adequate cardiac output will improve Outcome: Progressing   Problem: Fluid Volume: Goal: Ability to achieve a balanced intake and output will improve Outcome: Progressing   Problem: Metabolic: Goal: Ability to maintain appropriate glucose levels will improve Outcome: Progressing   Problem: Respiratory: Goal: Will regain and/or maintain adequate ventilation Outcome: Progressing   Problem: Urinary Elimination: Goal: Ability to achieve and maintain adequate renal perfusion and functioning will improve Outcome: Progressing   Problem: Coping: Goal: Ability to adjust to condition or change in health will improve Outcome: Progressing   Problem: Metabolic: Goal: Ability to maintain appropriate glucose levels will improve Outcome: Progressing   Problem: Skin Integrity: Goal: Risk for impaired skin integrity will decrease Outcome: Progressing   Problem: Tissue Perfusion: Goal: Adequacy of tissue perfusion will improve Outcome: Progressing   Problem: Activity: Goal: Risk for activity intolerance will decrease Outcome: Progressing   Problem: Coping: Goal: Level of anxiety will decrease Outcome: Progressing   Problem: Pain Managment: Goal: General experience of comfort will improve Outcome: Progressing   Problem: Safety: Goal: Ability to remain free from injury will improve Outcome: Progressing

## 2023-02-07 NOTE — [place] FL2 (Signed)
[place] MEDICAID FL2 LEVEL OF CARE FORM     IDENTIFICATION  Patient Name: Gary [name] Birthdate: 1946/01/22 Sex: male Admission Date (Current Location): 02/05/2023  [place] and [place] Number:  Chiropodist and Address:  [facility], [address], [place], [place] [address]      Provider Number: [id number]  Attending Physician Name and Address:  Gillis Santa, MD  Relative Name and Phone Number:  Mackey Birchwood [phone]    Current Level of Care: Hospital Recommended Level of Care: Skilled Nursing Facility Prior Approval Number:    Date Approved/Denied:   PASRR Number: [id number]  Discharge Plan: SNF    Current Diagnoses: Patient Active Problem List   Diagnosis Date Noted   Adenomatous polyp of colon 02/04/2023   Diarrhea 01/22/2023   Increased anion gap metabolic acidosis 01/14/2023   Elevated INR 01/13/2023   Pressure injury of skin 01/13/2023   Orthostatic hypotension 01/12/2023   Sepsis due to cellulitis (HCC) 01/12/2023   AKI (acute kidney injury) (HCC) 01/12/2023   Depression, major, single episode, mild (HCC) 11/15/2022   Recurrent falls 08/29/2021   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    Left rib fracture_7th rib    Leukocytosis    HTN (hypertension)    Hypothyroid    CAD (coronary artery disease)    Porokeratosis 05/15/2021   Aortic atherosclerosis (HCC) 05/05/2021   Emphysema lung (HCC) 05/05/2021   Thrombocytopenia (HCC) 04/29/2021   Splinter of foot without infection 02/23/2021   Arm laceration 02/09/2021   Hypokalemia 10/24/2020   Injury by nail 08/15/2020   Shingles 07/24/2020   Rash 07/02/2020   Colon cancer screening 07/02/2020   Sick sinus syndrome (HCC) 12/28/2019   Acquired absence of kidney 09/13/2019   Edema of lower extremity 09/13/2019   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, or unspecified 09/13/2019   Proteinuria  09/13/2019   Lower extremity pain, bilateral    Cellulitis 08/18/2019   Left hip pain 07/16/2019   Pain due to onychomycosis of toenails of both feet 04/03/2019   Swelling of limb 02/07/2019   CKD (chronic kidney disease) stage 3, GFR 30-59 ml/min (HCC) 09/16/2018   Left shoulder pain 09/16/2018   Chronic cholecystitis 12/29/2017   Graves disease 12/29/2017   Peripheral neuropathy 12/29/2017   S/p nephrectomy 12/29/2017   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   Symptomatic bradycardia 12/02/2017   CKD (chronic kidney disease), stage IIIa 12/02/2017   Chronic systolic CHF (congestive heart failure) (HCC) 12/02/2017   Saturday night paralysis 11/12/2017   Hypoglycemia 10/28/2017   Goals of care, counseling/discussion 10/24/2017   Lymphedema 10/20/2017   Iron deficiency anemia 09/11/2017   Fall 08/13/2017   Humerus fracture 08/13/2017   Anemia 01/12/2017   Bilateral carotid artery stenosis 06/29/2016   Hand laceration 12/16/2015   Adjustment disorder with depressed mood 08/11/2015   Cardiomyopathy, idiopathic (HCC) 04/22/2015   CAD in native artery 11/02/2014   Mantle cell lymphoma (HCC) 08/03/2014   History of colonic polyps 04/29/2014   Irregular heart beat 04/22/2014   SOB (shortness of breath) 04/22/2014   Stress 03/21/2014   PVC (premature ventricular contraction) 02/21/2014   GERD (gastroesophageal reflux disease) 10/23/2013   Gastroparesis 06/25/2013   Chest pain 04/13/2013   Thyroid disease 04/13/2013   Diabetes (HCC) 04/13/2013   Essential hypertension, benign 04/13/2013   Hypercholesterolemia 04/13/2013   Constipation, unspecified 10/13/1999   Depression, unspecified 10/13/1999   Dry eye syndrome of bilateral lacrimal  glands 10/13/1999   Chronic venous insufficiency 10/13/1999   Athscl heart disease of native coronary artery w/o ang pctrs 10/13/1999    Orientation RESPIRATION BLADDER Height & Weight     Self, Time, Situation, Place  Normal Continent Weight:  201 lb 1 oz (91.2 kg) Height:  5\' 9"  (175.3 cm)  BEHAVIORAL SYMPTOMS/MOOD NEUROLOGICAL BOWEL NUTRITION STATUS      Continent Diet  AMBULATORY STATUS COMMUNICATION OF NEEDS Skin   Extensive Assist Verbally Bruising (left shoulder, right arm and leg)                       Personal Care Assistance Level of Assistance  Bathing, Feeding, Dressing Bathing Assistance: Maximum assistance Feeding assistance: Limited assistance Dressing Assistance: Maximum assistance     Functional Limitations Info  Sight, Hearing, Speech Sight Info: Impaired (wears glasses) Hearing Info: Adequate Speech Info: Adequate    SPECIAL CARE FACTORS FREQUENCY  PT (By licensed PT), OT (By licensed OT)     PT Frequency: 5x per week OT Frequency: 5x per week            Contractures Contractures Info: Not present    Additional Factors Info  Code Status, Allergies Code Status Info: Full code Allergies Info: Rofecoxib           Current Medications (02/07/2023):  This is the current hospital active medication list Current Facility-Administered Medications  Medication Dose Route Frequency Provider Last Rate Last Admin   0.9 %  sodium chloride infusion   Intravenous Continuous Gillis Santa, MD   Stopped at 02/07/23 0911   acetaminophen (TYLENOL) tablet 650 mg  650 mg Oral Q6H PRN Gillis Santa, MD       cefTRIAXone (ROCEPHIN) 1 g in sodium chloride 0.9 % 100 mL IVPB  1 g Intravenous Q24H Skip Mayer A, MD   Stopping previously hung infusion at 02/07/23 0126   Chlorhexidine Gluconate Cloth 2 % PADS 6 each  6 each Topical Daily Lurline Del, MD   6 each at 02/07/23 0954   cholestyramine (QUESTRAN) packet 4 g  4 g Oral BID Lurline Del, MD       clotrimazole-betamethasone (LOTRISONE) cream   Topical BID Gillis Santa, MD   Given at 02/07/23 4098   ferrous sulfate tablet 325 mg  325 mg Oral Q breakfast Skip Mayer A, MD   325 mg at 02/07/23 0813   FLUoxetine (PROZAC) capsule 20  mg  20 mg Oral Daily Skip Mayer A, MD   20 mg at 02/07/23 0813   folic acid (FOLVITE) tablet 1 mg  1 mg Oral Daily Gillis Santa, MD   1 mg at 02/07/23 0910   glucose chewable tablet 4 g  1 tablet Oral Once PRN Lurline Del, MD       heparin injection 5,000 Units  5,000 Units Subcutaneous Q8H Skip Mayer A, MD   5,000 Units at 02/07/23 1307   insulin aspart (novoLOG) injection 0-6 Units  0-6 Units Subcutaneous Q4H Gillis Santa, MD   1 Units at 02/06/23 2233   insulin glargine-yfgn (SEMGLEE) injection 5 Units  5 Units Subcutaneous Daily Gillis Santa, MD       lactobacillus (FLORANEX/LACTINEX) granules 1 g  1 g Oral TID WC Skip Mayer A, MD   1 g at 02/07/23 1306   [START ON 02/08/2023] levothyroxine (SYNTHROID) tablet 100 mcg  100 mcg Oral QAC breakfast Gillis Santa, MD       loperamide (IMODIUM) capsule 2  mg  2 mg Oral PRN Skip Mayer A, MD       midodrine (PROAMATINE) tablet 10 mg  10 mg Oral TID WC Skip Mayer A, MD   10 mg at 02/07/23 1307   morphine (PF) 2 MG/ML injection 2 mg  2 mg Intravenous Q3H PRN Willy Eddy, MD   2 mg at 02/07/23 0949   ondansetron (ZOFRAN) injection 4 mg  4 mg Intravenous Q6H PRN Gillis Santa, MD       pantoprazole (PROTONIX) EC tablet 40 mg  40 mg Oral BID AC Skip Mayer A, MD   40 mg at 02/07/23 0813   pravastatin (PRAVACHOL) tablet 40 mg  40 mg Oral QHS Skip Mayer A, MD   40 mg at 02/06/23 2235   Facility-Administered Medications Ordered in Other Encounters  Medication Dose Route Frequency Provider Last Rate Last Admin   heparin lock flush 100 unit/mL  500 Units Intravenous Once Jeralyn Ruths, MD       sodium chloride flush (NS) 0.9 % injection 10 mL  10 mL Intravenous PRN Jeralyn Ruths, MD   10 mL at 02/01/18 0829   sodium chloride flush (NS) 0.9 % injection 10 mL  10 mL Intravenous PRN Jeralyn Ruths, MD   10 mL at 04/05/18 0800     Discharge Medications: Please see discharge summary  for a list of discharge medications.  Relevant Imaging Results:  Relevant Lab Results:   Additional Information SSN# [id number]  Verna Czech [place], [place]

## 2023-02-07 NOTE — Consult Note (Signed)
Consultation Note Date: 02/07/2023   Patient Name: Gary [name]  DOB: 07/30/1946  MRN: [id number]  Age / Sex: 77 y.o., male  PCP: Dale [place], MD Referring Physician: Gillis Santa, MD  Reason for Consultation: Establishing goals of care   HPI/Brief Hospital Course: 77 y.o. male  with past medical history of recurrent stage III mantle cell lymphoma last chemo treatment 11/06/2022 with last PET scan 11/2022 showing significant interval response to therapy, DM1,CHF, HTN, HLD  admitted from [facility] on 02/05/2023 with frequent falls.  Unwitnessed fall 4/26 with head trauma with associated neck pain  CTH/Cervical spine IMPRESSION: 1. Distraction injury at C6-C7, with fracture through the right anterior superior C7 endplate and widening of the anterior aspect of the disc space at C6-C7. Prevertebral fluid anterior to C4-T2. 2. No acute intracranial process. 3. Small right pleural effusion.  Noted recent hospitalization 01/12/2023-01/25/2023 treated for sepsis secondary to LE cellulitis as well as AKI   Ongoing issue with diarrhea noted since January-not felt to be related to chemo treatments, continued on Questran and as needed Imodium  Has been seen by GI as outpatient Colonoscopy 02/04/2023-findings of polyps in ascending colon otherwise normal  Palliative medicine was consulted for  Subjective:  Extensive chart review has been completed prior to meeting patient including labs, vital signs, imaging, progress notes, orders, and available advanced directive documents from current and previous encounters.  Introduced myself as a Publishing rights manager as a member of the palliative care team. Explained palliative medicine is specialized medical care for people living with serious illness. It focuses on providing relief from the symptoms and stress of a serious illness. The goal is to improve quality of life for both the patient and the family.   Visited  with [name] at his bedside. Recalls our meetings from his previous admission. [name] quite emotional during our visit as he shares the ongoing issues he has had since his last discharge.  Initially when he arrived at [facility] he felt he was progressing well with PT. He shares his grievances he experienced while he was there as far as staff not being attentive and the multiple falls he had while there. He shares his last fall was due to staff not providing assistance to bathroom and he had such an urge and did not want to make a mess on himself.  He voices his fears around falling as this has been an ongoing issue for him. Recalls a fall he had at home and laid on floor for many hours as he could not get to his phone.  He shares his frustrations around not being able to eat and drink "real food." Referring to thickened liquids and pureed food. Reviewed reasoning behind related to evaluation by ST and further barrier to safe swallowing being cervical collar. After explanation he voiced understanding and seemed more receptive to the need for him to have a modified diet. Agreed to attempting a few bites of his breakfast with staff assistance.  [name] shares he continues to wish to "get better" and back home to his dog. Remains emotional throughout our visit. Emotional support provided.  Called and spoke with sister-Susan. [name] also shares the grievances towards [facility]. [name] shares reason for admission was avoidable and remains frustrated at the care [name] has received at [facility]. She wishes to speak with TOC regarding difference placement at discharge. I shared I was not sure of the process but TOC would be able to assist or provide recommendations  on how to proceed.  [name] remains hopeful that [name] will get the treatment that he needs to recover. I discussed the importance of conversations surrounding GOC with readmission to hospital and ongoing functional decline. [name] voices  understanding but feels at this time Mr. Duell needs time for processing and healing.  I discussed importance of continued conversations with family/support persons and all members of their medical team regarding overall plan of care and treatment options ensuring decisions are in alignment with patients goals of care.  All questions/concerns addressed. Emotional support provided to patient/family/support persons. PMT will continue to follow and support patient as needed.  Objective: Primary Diagnoses: Present on Admission:  Fall   Physical Exam Constitutional:      General: He is not in acute distress.    Appearance: He is ill-appearing.  Pulmonary:     Effort: Pulmonary effort is normal. No respiratory distress.  Abdominal:     General: Abdomen is flat.     Palpations: Abdomen is soft.  Skin:    General: Skin is warm and dry.     Findings: Bruising present.  Neurological:     Mental Status: He is alert.     Motor: Weakness present.  Psychiatric:        Mood and Affect: Affect is tearful.     Vital Signs: BP (!) 108/50 (BP Location: Right Arm)   Pulse 80   Temp 97.7 F (36.5 C)   Resp 16   Ht 5\' 9"  (1.753 m)   Wt 91.2 kg   SpO2 96%   BMI 29.69 kg/m  Pain Scale: 0-10   Pain Score: 2    LBM: Last BM Date : 02/06/23 Baseline Weight: Weight: 96.6 kg Most recent weight: Weight: 91.2 kg      Assessment and Plan  SUMMARY OF RECOMMENDATIONS   Full Code Ongoing GOC needed-time needed for processing and recovery PMT to continue to follow for ongoing needs and support and GOC conversations  Thank you for this consult and allowing Palliative Medicine to participate in the care of [name]. Palliative medicine will continue to follow and assist as needed.   Time Total: 75 minutes  Time spent includes: Detailed review of medical records (labs, imaging, vital signs), medically appropriate exam (mental status, respiratory, cardiac, skin), discussed with treatment  team, counseling and educating patient, family and staff, documenting clinical information, medication management and coordination of care.   Signed by: Leeanne Deed, DNP, AGNP-C Palliative Medicine    Please contact Palliative Medicine Team phone at [phone] for questions and concerns.  For individual provider: See Loretha Stapler

## 2023-02-07 NOTE — TOC Initial Note (Signed)
Transition of Care [facility]) - Initial/Assessment Note    Patient Details  Name: Gary [name] MRN: [id number] Date of Birth: 1946/08/25  Transition of Care [facility]) CM/SW Contact:    Verna Czech [place], [place] Phone Number: 02/07/2023, 2:44 PM  Clinical Narrative:                  CSW met with patient at bedside to discuss return to SNF recommendation.Patient states that he is agreeable to SNF once stable but does not want to return to [facility].  Patient discussed [facility] being a top preference. Patient resides in his own home, states that he as a supportive sister that resides about 1 hour away. Per patient, he has no additional services in the home. Patient is followed by [employer] and has a walker at home. Bed offers to be provided once received.  Edeline Greening, LCSW Clinical Social Worker  [facility] Care Management [phone]    Expected Discharge Plan: Skilled Nursing Facility Barriers to Discharge: Continued Medical Work up   Patient Goals and CMS Choice Patient states their goals for this hospitalization and ongoing recovery are:: To get well and go home          Expected Discharge Plan and Services In-house Referral: Clinical Social Work     Living arrangements for the past 2 months: Single Family Home                                      Prior Living Arrangements/Services Living arrangements for the past 2 months: Single Family Home Lives with:: Self   Do you feel safe going back to the place where you live?: Yes      Need for Family Participation in Patient Care: Yes (Comment) Care giver support system in place?: Yes (comment)   Criminal Activity/Legal Involvement Pertinent to Current Situation/Hospitalization: No - Comment as needed  Activities of Daily Living Home Assistive Devices/Equipment: Dentures (specify type), Cane (specify quad or straight) (Raised toilet seat with arm rests) ADL Screening (condition at time of  admission) Patient's cognitive ability adequate to safely complete daily activities?: Yes Is the patient deaf or have difficulty hearing?: No Does the patient have difficulty seeing, even when wearing glasses/contacts?: No Does the patient have difficulty concentrating, remembering, or making decisions?: No Patient able to express need for assistance with ADLs?: Yes Does the patient have difficulty dressing or bathing?: Yes Independently performs ADLs?: No Communication: Independent Dressing (OT): Needs assistance Is this a change from baseline?: Change from baseline, expected to last >3 days Grooming: Independent Feeding: Independent Bathing: Needs assistance (okay to bathe at sink) Is this a change from baseline?: Pre-admission baseline Toileting: Needs assistance Is this a change from baseline?: Pre-admission baseline In/Out Bed: Needs assistance Is this a change from baseline?: Pre-admission baseline Walks in Home: Independent with device (comment) (Cane or walking stick) Is this a change from baseline?: Pre-admission baseline Does the patient have difficulty walking or climbing stairs?: Yes Weakness of Legs: Both Weakness of Arms/Hands: Both  Permission Sought/Granted                  Emotional Assessment Appearance:: Appears stated age Attitude/Demeanor/Rapport: Engaged Affect (typically observed): Agitated Orientation: : Oriented to Self, Oriented to Place, Oriented to  Time, Oriented to Situation Alcohol / Substance Use: Not Applicable Psych Involvement: No (comment)  Admission diagnosis:  Fall [W19.XXXA] Frequent falls [R29.6] Scalp laceration,  initial encounter [S01.01XA] Other closed nondisplaced fracture of seventh cervical vertebra, initial encounter [facility]) [S12.691A] Patient Active Problem List   Diagnosis Date Noted   Adenomatous polyp of colon 02/04/2023   Diarrhea 01/22/2023   Increased anion gap metabolic acidosis 01/14/2023   Elevated INR 01/13/2023    Pressure injury of skin 01/13/2023   Orthostatic hypotension 01/12/2023   Sepsis due to cellulitis (HCC) 01/12/2023   AKI (acute kidney injury) (HCC) 01/12/2023   Depression, major, single episode, mild (HCC) 11/15/2022   Recurrent falls 08/29/2021   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    Left rib fracture_7th rib    Leukocytosis    HTN (hypertension)    Hypothyroid    CAD (coronary artery disease)    Porokeratosis 05/15/2021   Aortic atherosclerosis (HCC) 05/05/2021   Emphysema lung (HCC) 05/05/2021   Thrombocytopenia (HCC) 04/29/2021   Splinter of foot without infection 02/23/2021   Arm laceration 02/09/2021   Hypokalemia 10/24/2020   Injury by nail 08/15/2020   Shingles 07/24/2020   Rash 07/02/2020   Colon cancer screening 07/02/2020   Sick sinus syndrome (HCC) 12/28/2019   Acquired absence of kidney 09/13/2019   Edema of lower extremity 09/13/2019   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, or unspecified 09/13/2019   Proteinuria 09/13/2019   Lower extremity pain, bilateral    Cellulitis 08/18/2019   Left hip pain 07/16/2019   Pain due to onychomycosis of toenails of both feet 04/03/2019   Swelling of limb 02/07/2019   CKD (chronic kidney disease) stage 3, GFR 30-59 ml/min (HCC) 09/16/2018   Left shoulder pain 09/16/2018   Chronic cholecystitis 12/29/2017   Graves disease 12/29/2017   Peripheral neuropathy 12/29/2017   S/p nephrectomy 12/29/2017   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   Symptomatic bradycardia 12/02/2017   CKD (chronic kidney disease), stage IIIa 12/02/2017   Chronic systolic CHF (congestive heart failure) (HCC) 12/02/2017   Saturday night paralysis 11/12/2017   Hypoglycemia 10/28/2017   Goals of care, counseling/discussion 10/24/2017   Lymphedema 10/20/2017   Iron deficiency anemia 09/11/2017   Fall 08/13/2017   Humerus fracture 08/13/2017   Anemia 01/12/2017   Bilateral carotid  artery stenosis 06/29/2016   Hand laceration 12/16/2015   Adjustment disorder with depressed mood 08/11/2015   Cardiomyopathy, idiopathic (HCC) 04/22/2015   CAD in native artery 11/02/2014   Mantle cell lymphoma (HCC) 08/03/2014   History of colonic polyps 04/29/2014   Irregular heart beat 04/22/2014   SOB (shortness of breath) 04/22/2014   Stress 03/21/2014   PVC (premature ventricular contraction) 02/21/2014   GERD (gastroesophageal reflux disease) 10/23/2013   Gastroparesis 06/25/2013   Chest pain 04/13/2013   Thyroid disease 04/13/2013   Diabetes (HCC) 04/13/2013   Essential hypertension, benign 04/13/2013   Hypercholesterolemia 04/13/2013   Constipation, unspecified 10/13/1999   Depression, unspecified 10/13/1999   Dry eye syndrome of bilateral lacrimal glands 10/13/1999   Chronic venous insufficiency 10/13/1999   Athscl heart disease of native coronary artery w/o ang pctrs 10/13/1999   PCP:  Dale Browning, MD Pharmacy:   [facility] - [place], [place] - [address] [place] [address] Phone: [phone] Fax: [phone]  [facility] [address] [place] [address] Phone: [phone] Fax: [phone]     Social Determinants of Health (SDOH) Social History: SDOH Screenings   Food Insecurity: No Food Insecurity (01/12/2023)  Housing: Low Risk  (  01/12/2023)  Transportation Needs: No Transportation Needs (01/12/2023)  Recent Concern: Transportation Needs - Unmet Transportation Needs (01/11/2023)  Utilities: Not At Risk (01/12/2023)  Alcohol Screen: Low Risk  (11/27/2020)  Depression (PHQ2-9): High Risk (01/12/2023)  Financial Resource Strain: Low Risk  (03/10/2022)  Physical Activity: Inactive (03/10/2022)  Social Connections: Socially Isolated (03/10/2022)  Stress: No Stress Concern Present (03/10/2022)  Tobacco Use: High Risk (02/05/2023)   SDOH Interventions:      Readmission Risk Interventions    01/15/2023   11:37 AM 08/30/2021   10:48 AM  Readmission Risk Prevention Plan  Transportation Screening Complete Complete  PCP or Specialist Appt within 3-5 Days Complete Complete  HRI or Home Care Consult Complete Complete  Social Work Consult for Recovery Care Planning/Counseling Complete Complete  Palliative Care Screening Not Applicable Not Applicable  Medication Review Oceanographer) Complete Complete

## 2023-02-08 ENCOUNTER — Telehealth: Payer: Self-pay

## 2023-02-08 ENCOUNTER — Other Ambulatory Visit: Payer: Self-pay

## 2023-02-08 DIAGNOSIS — W19XXXD Unspecified fall, subsequent encounter: Secondary | ICD-10-CM

## 2023-02-08 DIAGNOSIS — S12601A Unspecified nondisplaced fracture of seventh cervical vertebra, initial encounter for closed fracture: Secondary | ICD-10-CM | POA: Diagnosis not present

## 2023-02-08 LAB — MAGNESIUM: Magnesium: 1.8 mg/dL (ref 1.7–2.4)

## 2023-02-08 LAB — CBC WITH DIFFERENTIAL/PLATELET
Abs Immature Granulocytes: 0.09 10*3/uL — ABNORMAL HIGH (ref 0.00–0.07)
Basophils Absolute: 0 10*3/uL (ref 0.0–0.1)
Basophils Relative: 1 %
Eosinophils Absolute: 0.1 10*3/uL (ref 0.0–0.5)
Eosinophils Relative: 10 %
HCT: 27.4 % — ABNORMAL LOW (ref 39.0–52.0)
Hemoglobin: 8.5 g/dL — ABNORMAL LOW (ref 13.0–17.0)
Immature Granulocytes: 7 %
Lymphocytes Relative: 34 %
Lymphs Abs: 0.5 10*3/uL — ABNORMAL LOW (ref 0.7–4.0)
MCH: 29.8 pg (ref 26.0–34.0)
MCHC: 31 g/dL (ref 30.0–36.0)
MCV: 96.1 fL (ref 80.0–100.0)
Monocytes Absolute: 0.4 10*3/uL (ref 0.1–1.0)
Monocytes Relative: 25 %
Neutro Abs: 0.3 10*3/uL — CL (ref 1.7–7.7)
Neutrophils Relative %: 23 %
Platelets: 87 10*3/uL — ABNORMAL LOW (ref 150–400)
RBC: 2.85 MIL/uL — ABNORMAL LOW (ref 4.22–5.81)
RDW: 15.7 % — ABNORMAL HIGH (ref 11.5–15.5)
WBC: 1.4 10*3/uL — CL (ref 4.0–10.5)
nRBC: 0 % (ref 0.0–0.2)

## 2023-02-08 LAB — BASIC METABOLIC PANEL
Anion gap: 7 (ref 5–15)
BUN: 14 mg/dL (ref 8–23)
CO2: 26 mmol/L (ref 22–32)
Calcium: 7.4 mg/dL — ABNORMAL LOW (ref 8.9–10.3)
Chloride: 106 mmol/L (ref 98–111)
Creatinine, Ser: 1.12 mg/dL (ref 0.61–1.24)
GFR, Estimated: >60 mL/min (ref >60)
Glucose, Bld: 159 mg/dL — ABNORMAL HIGH (ref 70–99)
Potassium: 4.2 mmol/L (ref 3.5–5.1)
Sodium: 139 mmol/L (ref 135–145)

## 2023-02-08 LAB — GLUCOSE, CAPILLARY
Glucose-Capillary: 112 mg/dL — ABNORMAL HIGH (ref 70–99)
Glucose-Capillary: 121 mg/dL — ABNORMAL HIGH (ref 70–99)
Glucose-Capillary: 126 mg/dL — ABNORMAL HIGH (ref 70–99)
Glucose-Capillary: 137 mg/dL — ABNORMAL HIGH (ref 70–99)
Glucose-Capillary: 137 mg/dL — ABNORMAL HIGH (ref 70–99)
Glucose-Capillary: 154 mg/dL — ABNORMAL HIGH (ref 70–99)
Glucose-Capillary: 191 mg/dL — ABNORMAL HIGH (ref 70–99)

## 2023-02-08 LAB — PHOSPHORUS: Phosphorus: 2.8 mg/dL (ref 2.5–4.6)

## 2023-02-08 MED ORDER — LEVOTHYROXINE SODIUM 50 MCG PO TABS
50.0000 ug | ORAL_TABLET | Freq: Every day | ORAL | Status: DC
  Administered 2023-02-09 – 2023-02-17 (×9): 50 ug via ORAL
  Filled 2023-02-08 (×9): qty 1

## 2023-02-08 NOTE — Progress Notes (Signed)
Physical Therapy Treatment Patient Details Name: Gary [name] MRN: [id number] DOB: 1946-01-27 Today's Date: 02/08/2023   History of Present Illness Gary [name] is a 77 y.o. male with medical history significant of   recurrent stage III mantle cell lymphoma s/p 43 cycles of Rituxan and Treanda, depression, insulin-dependent diabetes mellitus, hypertension, hyperlipidemia, who presents to ED with increase falls over the last week at [facility] nursing home. Pt had been discharged to [facility]) after hospital admission in mid April 2024. Prior to recent hospitalization he was living at home alone. He has experienced multiple falls over last month. Pt also reports increased difficulty swallowing over last month. Cervical X-ray reveals Distraction injury at C6-C7, with fracture through the right  anterior superior C7 endplate and widening of the anterior aspect of  the disc space at C6-C7. Prevertebral fluid anterior to C4-T2. Neurosurgery Dr Welton Flakes recommends cervical collar and out patient follow up.    PT Comments    Pt ready for session.  He is able to transition to EOB with mod a x 2 - student nurse in to assist.  Generally steady today in sitting with better positioning.  He does agree to try standing but declined OOB to recliner.  Stood from elevated bed with min/mod a x 2 to RW.  Good power up today and overall improved balance with less post lean but does still voice fear of falling.  After a seated rest, he is able to stand again and hesitantly takes a few sidesteps along the bed to reposition prior to needing mod a x 2 to return to supine.     Recommendations for follow up therapy are one component of a multi-disciplinary discharge planning process, led by the attending physician.  Recommendations may be updated based on patient status, additional functional criteria and insurance authorization.  Follow Up Recommendations       Assistance Recommended at Discharge Frequent or  constant Supervision/Assistance  Patient can return home with the following Two people to help with walking and/or transfers;A lot of help with bathing/dressing/bathroom;Assistance with cooking/housework;Assist for transportation;Help with stairs or ramp for entrance;Direct supervision/assist for medications management;Direct supervision/assist for financial management   Equipment Recommendations       Recommendations for Other Services       Precautions / Restrictions Precautions Precautions: Fall;Cervical Precaution Comments: Cervical collar; C7 fracture Restrictions Weight Bearing Restrictions: No     Mobility  Bed Mobility Overal bed mobility: Needs Assistance Bed Mobility: Supine to Sit, Sit to Supine     Supine to sit: Mod assist, +2 for physical assistance Sit to supine: Mod assist, +2 for physical assistance   General bed mobility comments: overall improved ability for pt to assist today.  more comfortable for pt with +2 assist.    Transfers Overall transfer level: Needs assistance Equipment used: Rolling walker (2 wheels) Transfers: Sit to/from Stand Sit to Stand: Min assist, From elevated surface, +2 physical assistance, Mod assist                Ambulation/Gait Ambulation/Gait assistance: Mod assist, +2 physical assistance, Min assist Gait Distance (Feet): 3 Feet Assistive device: Rolling walker (2 wheels) Gait Pattern/deviations: Step-to pattern Gait velocity: decreased     General Gait Details: sidesteps along bed ro reposition.  some post lean and fear noted.   Stairs             Wheelchair Mobility    Modified Rankin (Stroke Patients Only)  Balance Overall balance assessment: Needs assistance Sitting-balance support: Feet supported, Bilateral upper extremity supported Sitting balance-Leahy Scale: Fair Sitting balance - Comments: steady static sitting   Standing balance support: Bilateral upper extremity supported, Reliant  on assistive device for balance, During functional activity Standing balance-Leahy Scale: Poor Standing balance comment: min/mod a x 2 for standing and balance but is improved since eval                            Cognition Arousal/Alertness: Awake/alert Behavior During Therapy: WFL for tasks assessed/performed Overall Cognitive Status: Within Functional Limits for tasks assessed                                          Exercises      General Comments        Pertinent Vitals/Pain Pain Assessment Pain Assessment: Faces Faces Pain Scale: Hurts whole lot Pain Location: with transitions but 2/10 at rest or sitting Pain Descriptors / Indicators: Sore, Grimacing, Guarding Pain Intervention(s): Limited activity within patient's tolerance, Monitored during session, Repositioned    Home Living                          Prior Function            PT Goals (current goals can now be found in the care plan section) Progress towards PT goals: Progressing toward goals    Frequency    7X/week      PT Plan Current plan remains appropriate    Co-evaluation              AM-PAC PT "6 Clicks" Mobility   Outcome Measure  Help needed turning from your back to your side while in a flat bed without using bedrails?: A Lot Help needed moving from lying on your back to sitting on the side of a flat bed without using bedrails?: A Lot Help needed moving to and from a bed to a chair (including a wheelchair)?: A Lot Help needed standing up from a chair using your arms (e.g., wheelchair or bedside chair)?: A Lot Help needed to walk in hospital room?: A Lot Help needed climbing 3-5 steps with a railing? : Total 6 Click Score: 11    End of Session Equipment Utilized During Treatment: Gait belt;Cervical collar Activity Tolerance: Patient limited by fatigue;Patient limited by pain Patient left: in bed;with call bell/phone within reach;with bed alarm  set Nurse Communication: Mobility status;Precautions;Weight bearing status PT Visit Diagnosis: Muscle weakness (generalized) (M62.81);Difficulty in walking, not elsewhere classified (R26.2);Unsteadiness on feet (R26.81);Repeated falls (R29.6)     Time: 1610-9604 PT Time Calculation (min) (ACUTE ONLY): 17 min  Charges:  $Therapeutic Activity: 8-22 mins                   Danielle Dess, PTA 02/08/23, 10:59 AM

## 2023-02-08 NOTE — Telephone Encounter (Signed)
Please schedule a new patient appointment with Duwayne Heck or Kennyth Arnold in the next couple of weeks. C7 fracture. Will need xrays that day. Thanks

## 2023-02-08 NOTE — Plan of Care (Signed)
  Problem: Fluid Volume: Goal: Hemodynamic stability will improve Outcome: Progressing   Problem: Clinical Measurements: Goal: Signs and symptoms of infection will decrease Outcome: Progressing   Problem: Respiratory: Goal: Ability to maintain adequate ventilation will improve Outcome: Progressing   Problem: Cardiac: Goal: Ability to maintain an adequate cardiac output will improve Outcome: Progressing   Problem: Fluid Volume: Goal: Ability to achieve a balanced intake and output will improve Outcome: Progressing   Problem: Metabolic: Goal: Ability to maintain appropriate glucose levels will improve Outcome: Progressing   Problem: Nutritional: Goal: Maintenance of adequate nutrition will improve Outcome: Progressing   Problem: Respiratory: Goal: Will regain and/or maintain adequate ventilation Outcome: Progressing   Problem: Urinary Elimination: Goal: Ability to achieve and maintain adequate renal perfusion and functioning will improve Outcome: Progressing   Problem: Coping: Goal: Ability to adjust to condition or change in health will improve Outcome: Progressing   Problem: Fluid Volume: Goal: Ability to maintain a balanced intake and output will improve Outcome: Progressing   Problem: Metabolic: Goal: Ability to maintain appropriate glucose levels will improve Outcome: Progressing   Problem: Skin Integrity: Goal: Risk for impaired skin integrity will decrease Outcome: Progressing   Problem: Clinical Measurements: Goal: Cardiovascular complication will be avoided Outcome: Progressing   Problem: Activity: Goal: Risk for activity intolerance will decrease Outcome: Progressing   Problem: Pain Managment: Goal: General experience of comfort will improve Outcome: Progressing   Problem: Safety: Goal: Ability to remain free from injury will improve Outcome: Progressing

## 2023-02-08 NOTE — Progress Notes (Signed)
Speech Language Pathology Treatment: Dysphagia  Patient Details Name: Gary [name] MRN: [id number] DOB: [birth year] Today's Date: 02/08/2023 Time: 4098-1191 SLP Time Calculation (min) (ACUTE ONLY): 10 min  Assessment / Plan / Recommendation Clinical Impression  Pt seen for diet tolerance. Pt alert and cooperative. Imprecise articulation continues. Pt with [place] J collar donned for duration of tx.    Pt given trials of simulated groudn solids (cracker crumbled in pudding) and thin liquids (via straw). Oral dysphagia persists and is c/b mildly prolonged mastication and trace lingual residual which was likely due to dental status and deconditioned state as well as limited mandibular excursion with [place] J collar donned. Pharyngeally, no overt s/sx pharyngeal dysphagia appreciated. No change to vocal quality. Pt able to feed self with intermittent assistance due to reduced UE ROM.    Recommend continuation of Dysphagia 2 Diet with Thin Liquids with safe swallowing strategies/aspiration precautions/ reflux precautions as outlined below.    SLP to f/u for ongoing dysphagia management.    Pt made ware of results, recommendations, and SLP POC. Pt verbalized understanding/agreement. Reinforcement of content may be needed.    HPI HPI: Pt is a 77 y.o. male with medical history significant of  recurrent stage III mantle cell lymphoma s/p 43 cycles of Rituxan and Treanda, depression, insulin-dependent diabetes mellitus, hypertension, hyperlipidemia, who presents to ED with increase falls over the last week at [facility] nursing home. Pt had been discharged to [facility]) after hospital admission in mid April 2024. Prior to recent hospitalization, he was living at home alone. He had experienced multiple falls over last month. Pt also reports decreased oral intake and difficulty swallowing over last 1-2 months(see previous ST BSE last admit indicating Esophageal phase Dysmtolity and Regurgitation).   OF NOTE: Cervical X-ray reveals Distraction injury at C6-C7, with fracture through the right  anterior superior C7 endplate and widening of the anterior aspect of  the disc space at C6-C7. Prevertebral fluid anterior to C4-T2. Neurosurgery Dr Welton Flakes recommends cervical collar and out patient follow up.   CXR at admit: Chronic interstitial changes consistent with fibrosis. No evidence  of active pulmonary disease.      SLP Plan  Continue with current plan of care      Recommendations for follow up therapy are one component of a multi-disciplinary discharge planning process, led by the attending physician.  Recommendations may be updated based on patient status, additional functional criteria and insurance authorization.    Recommendations  Diet recommendations: Dysphagia 2 (fine chop);Thin liquid Liquids provided via: Cup;Straw;Teaspoon Medication Administration: Whole meds with puree (vs crushed) Supervision: Patient able to self feed;Staff to assist with self feeding (allow pt to feed self as much as possible) Compensations: Slow rate;Small sips/bites Postural Changes and/or Swallow Maneuvers: Seated upright 90 degrees;Upright 30-60 min after meal                  Oral care BID;Oral care before and after PO;Staff/trained caregiver to provide oral care     Dysphagia, oral phase (R13.11)     Continue with current plan of care    Gary [name], M.S., CCC-SLP Speech-Language Pathologist [facility] [phone] Gary [name])  Gary [name]  02/08/2023, 12:32 PM

## 2023-02-08 NOTE — Progress Notes (Addendum)
Palliative Care Progress Note, Assessment & Plan   Patient Name: Gary [name]       Date: 02/08/2023 DOB: Jul 03, 1946  Age: 77 y.o. MRN#: [id number] Attending Physician: Gillis Santa, MD Primary Care Physician: Dale Conashaugh Lakes, MD Admit Date: 02/05/2023  Subjective: Patient is lying in bed in no apparent distress.  He acknowledges my presence and is able to make his wishes known.  Neck brace in place.  Nurses at bedside administering AM medications.  No family or friends present at bedside during my visit.  HPI: 77 y.o. male  with past medical history of recurrent stage III mantle cell lymphoma last chemo treatment 11/06/2022 with last PET scan 11/2022 showing significant interval response to therapy, DM1,CHF, HTN, HLD  admitted from [facility] on 02/05/2023 with frequent falls.   Unwitnessed fall 4/26 with head trauma with associated neck pain   CTH/Cervical spine IMPRESSION: 1. Distraction injury at C6-C7, with fracture through the right anterior superior C7 endplate and widening of the anterior aspect of the disc space at C6-C7. Prevertebral fluid anterior to C4-T2. 2. No acute intracranial process. 3. Small right pleural effusion.   Noted recent hospitalization 01/12/2023-01/25/2023 treated for sepsis secondary to LE cellulitis as well as AKI    Ongoing issue with diarrhea noted since January-not felt to be related to chemo treatments, continued on Questran and as needed Imodium  Has been seen by GI as outpatient Colonoscopy 02/04/2023-findings of polyps in ascending colon otherwise normal  Summary of counseling/coordination of care: After reviewing the patient's chart and assessing the patient at bedside, I spoke with patient regards to plan and goals of care.  Symptoms assessed.  Patient  complains of diarrhea.  He endorses it has been a chronic problem.  However, he endorses that medication changes over the last several days have greatly improved his diarrhea.  He denies abdominal pain or discomfort at this time.  No adjustments to medications needed at this time.  I attempted to elicit values and goals important to the patient.  Advanced directives and code status discussed in detail.    Patient says he has named his sister as his HCPOA.  I requested that a copy of this paperwork be given to medical team for patient's record.  Patient has 1 son who he would not want to have medical decision making power in the event the patient is unable to speak for himself.  Code status discussed in detail.  Patient endorses that if his heart stops and his lungs ceased then he would not want attempts at resuscitation.  Full code versus DNR reviewed in detail.  Patient says that if he is pulseless and not breathing then he has already "gone home".  He would want the medical team to not intervene and allow a natural passing.  Patient went on to say that if he is suffering from a heart attack or having chest pain then he would want the medical team to act and give him every chance at a "shot at living".    Difference between DNR and medical decision making discussed.  Ongoing conversations needed to clarify patient's wishes.  Patient shared his grievances in regards to [employer].  Therapeutic silence and  active listening provided.  Emotional support given.  TOC aware of patient's wishes to not return to [employer] and consider other options for rehab.  I reiterated to patient that TOC is following closely for disposition planning.  PMT will continue to follow.  Physical Exam Vitals reviewed.  Constitutional:      General: He is not in acute distress.    Appearance: He is normal weight.  HENT:     Head: Normocephalic.     Nose: Nose normal.     Mouth/Throat:     Mouth: Mucous  membranes are moist.  Eyes:     Pupils: Pupils are equal, round, and reactive to light.  Cardiovascular:     Pulses: Normal pulses.  Pulmonary:     Effort: Pulmonary effort is normal.  Abdominal:     Palpations: Abdomen is soft.  Neurological:     Mental Status: He is alert and oriented to person, place, and time.  Psychiatric:        Mood and Affect: Mood normal.        Behavior: Behavior normal.        Thought Content: Thought content normal.             Total Time 35 minutes   Kane Kusek L. Manon Hilding, FNP-BC Palliative Medicine Team Team Phone # [phone]

## 2023-02-08 NOTE — Telephone Encounter (Signed)
The ER called Dr Madaline Brilliant about this patient on 02/05/23.   Per Dr Madaline Brilliant: "32M s/p fall from standing, head strike LOC negative head CT, CT Cpine with degen changes and reported widening of C6/7 disk space with prevertebral swelling.  Neuro intact.  Cant get MRI C spine because of pacemaker Plan:  C collar when OOB needs outpatient followup"

## 2023-02-08 NOTE — Consult Note (Signed)
WOC Nurse Consult Note: patient with lymphedema, chronic R lower extremity wound followed by vascular surgery;  home health places unna boots weekly  Reason for Consult: R lower leg chronic wound  Wound type: vascular Pressure Injury POA: NA  Measurement: 12 cms x 5 cms x 0.2 cms  Wound bed: 100% pink moist  Drainage (amount, consistency, odor) minimal serosanguinous  Periwound: chronic venous changes, mild edema, chronic discoloration  (dusky red in appearance), peeling scaling skin  Dressing procedure/placement/frequency: Wound care daily:  Clean bilateral legs with soap and water, dry briskly to assist with removing dead skin.  Apply single layer Xeroform gauze [place] [phone]) to bilateral anterior and posterior legs daily, cover Xeroform with ABD pads, wrap with Kerlix gauze beginning just above toes and ending right below knees.  Secure entire dressing with Ace wrap wrapped in same fashion as Kerlix.    POC discussed with patient and bedside nurse.  MD to assess patients leg on rounds today to determine if would like unna boots placed while inpatient.    Patient is to resume prior wound care per vascular and home health at discharge.    WOC team will not follow at this time.  Re-consult if further needs arise.   Thank you,    Priscella Mann MSN, RN-BC, [employer] [phone]

## 2023-02-08 NOTE — Progress Notes (Addendum)
CRITICAL VALUE STICKER  CRITICAL VALUES:  WBC 1.4 Absolute Neutrophil 0.3  NP NOTIFIED: Bishop Limbo, NP  TIME OF NOTIFICATION: [phone]

## 2023-02-08 NOTE — Progress Notes (Signed)
[facility] Progress Note  Patient: Gary [name]    ZOX:[id number]  DOA: 02/05/2023     Date of Service: the patient was seen and examined on 02/08/2023  Chief Complaint  Patient presents with   Fall   Brief hospital course: Gary [name] is a 77 y.o. male with medical history significant of  recurrent stage III mantle cell lymphoma s/p 43 cycles of Rituxan and Treanda, depression, insulin-dependent diabetes mellitus, hypertension, hyperlipidemia, who presents to ED with increase falls over the last week at [facility]. Patient of note has interim history of admission 01/12/2023-01/25/23 at which time patient was treated for  Sepsis due to cellulitis, as well as AKI. Patient at that time had presented to ED with generalized weakness and hypotension.  Patient also has interim history of colonoscopy on 02/04/23 with results noting polyp descending colon/hemorrhoids otherwise normal :Patient currently states he was attempting to go to the bathroom and his leg gave out on him and he was not able to use his arms to hold himself up due to being weak. Sister also aides in history she is concerned about current rehab has has had 3 falls since being admitted on 4/15. She is also concerned about his lack of appetite. She also note him picking at the air when he sleeps which is new for him and she is concerned about medication side-effect. Patient notes no fever/chills/, but does have nausea , no current abdominal pain or chest pain.   ED Course:  Afeb, bp 121/58, HR78, rr 18 sat 100%  UA : LE Moderate, bacteria rare CTH/Cervical spine IMPRESSION: 1. Distraction injury at C6-C7, with fracture through the right anterior superior C7 endplate and widening of the anterior aspect of the disc space at C6-C7. Prevertebral fluid anterior to C4-T2. 2. No acute intracranial process. 3. Small right pleural effusion.   Cxr: IMPRESSION: Chronic interstitial changes consistent with fibrosis. No evidence of active  pulmonary disease.   Wbc 2.4, hgb 9.7 at baseline,plt 110 Inr 1.4  NA 136, K 4.2, glu 155,  Xray pelvis; Osteopenia. No displaced fractures are visualized. If there is high clinical concern for hip fracture recommend dedicated hip views.  EKG ventricular paced  UA + wbc , LE, urine culture negative Tx tylenol, ultram    Assessment and Plan:  # Fracture of C7 endplate s/p Fall due to unknown reason Prevertebral fluid anterior to C4-T2 -case discussed with neurosurgery Dr Welton Flakes who rec cervical collar and out patient follow up  -supportive pain regimen  -Fall in setting weakness / low volume from chronic diarrhea  Check orthostatic vital signs Continue fall precautions, ambulate with assistance, turn patient every 2 hours Continue aspiration precautions -PT/OT evaluate done SLP eval done, started on dysphagia 2 diet with thin liquids     # Pyuria, UA positive, urine culture insufficient growth.  SNF was ceftriaxone, discontinued antibiotics on 4/29.     # AKI, baseline creatinine 1.03 on 01/1523 4/28 Creatinine 1.42--1.12 improving Monitor renal functions daily Bladder scan 94, ruled out urinary retention Continue IV fluid for gentle hydration    Hypotension, persistent -requiring midodrine  Recurrent stage III mantle cell lymphoma  -s/p 43 cycles of Rituxan and Treanda -Last chemo was in January 2024 -pancytopenia, due to malignancy, stable, no fever. -followed by oncology as outpatient Dr Orlie Dakin  4/29 texted to Dr. Orlie Dakin for neutropenia patient may need Zarzio?? but currently patient is asymptomatic, no fever.  Folic acid deficiency, folate level 3.3, started folic acid 1 mg  p.o. daily Anemia most likely secondary to chronic disease and underlying malignancy. Iron profile and B12 level within normal range Monitor H&H and transfuse if hemoglobin less than 7  Chronic diarrhea  -began with chemo  -followed by GI  -recent c-scope 4/25 noted only polyp and  hemorrhoids  -possible cause of patient falls due to orthostasis    Depression -continue ssri    Type 1 diabetes mellitus  Uncontrolled blood glucose could be due to lack of insulin use Diabetic coordinator consulted, Started Semglee 5 units daily and NovoLog sliding scale, monitor CBG Continue diabetic diet 4/28 CBGs low today, held Semglee   Hyperlipidemia -continue statin   Hypomagnesemia, mag repleted. Monitor electrolytes and replete as needed.   Hypothyroid, patient was on Synthroid 50 mcg p.o. daily TSH 5.8, elevated, could be due to acute illness. Continue Synthroid 50 mcg daily, repeat TSH level after 4 to 6 weeks.   Right lower extremity chronic wound and bilateral lower extremity pigmentation and dry skin Wound care RN consulted Started Lotrisone cream BID  Body mass index is 29.69 kg/m.  Interventions:   Pressure Injury 01/12/23 Buttocks Right;Left Stage 2 -  Partial thickness loss of dermis presenting as a shallow open injury with a red, pink wound bed without slough. (Active)  01/12/23 2330  Location: Buttocks  Location Orientation: Right;Left  Staging: Stage 2 -  Partial thickness loss of dermis presenting as a shallow open injury with a red, pink wound bed without slough.  Wound Description (Comments):   Present on Admission: Yes  Dressing Type Foam - Lift dressing to assess site every shift 02/08/23 1415     Pressure Injury 01/14/23 Coccyx Bilateral Deep Tissue Pressure Injury - Purple or maroon localized area of discolored intact skin or blood-filled blister due to damage of underlying soft tissue from pressure and/or shear. 4 DTI areas noted (Active)  01/14/23 1751  Location: Coccyx  Location Orientation: Bilateral  Staging: Deep Tissue Pressure Injury - Purple or maroon localized area of discolored intact skin or blood-filled blister due to damage of underlying soft tissue from pressure and/or shear.  Wound Description (Comments): 4 DTI areas noted   Present on Admission:   Dressing Type Foam - Lift dressing to assess site every shift 02/08/23 1415     Diet: Diabetic diet DVT Prophylaxis: Subcutaneous Heparin    Advance goals of care discussion: Full code  Family Communication: family was not present at bedside, at the time of interview.  The pt provided permission to discuss medical plan with the family. Opportunity was given to ask question and all questions were answered satisfactorily.   Disposition:  Pt is from [facility], admitted with fall and C7 Fx and uncontrolled DM, still has C-collar. CBG is well controlled now, but has neutropenia, which precludes a safe discharge. Discharge to SNF, when clinically stable, most likely tomorrow a.m. [facility] consulted for discharge planning  Subjective: No significant events overnight, patient was resting comfortably, denied any severe pain.  No fever or chills, no difficulty breathing, no chest pain or palpitations.  Physical Exam: General: NAD, lying comfortably Appear in no distress, affect appropriate Eyes: PERRLA ENT: Oral Mucosa Clear, moist,  Neck: no JVD, cervical collar intact Cardiovascular: S1 and S2 Present, no Murmur,  Respiratory: good respiratory effort, Bilateral Air entry equal and Decreased, no Crackles, no wheezes Abdomen: Bowel Sound present, Soft and no tenderness,  Skin: no rashes Extremities: Mild edema, right lower extremity chronic wound, chronic pigmentation, dryness is improving with topical cream. Neurologic:  without any new focal findings Gait not checked due to patient safety concerns  Vitals:   02/08/23 0441 02/08/23 0445 02/08/23 0750 02/08/23 1150  BP: (!) 116/98 (!) 138/56 110/62 (!) 115/57  Pulse: (!) 108 61 81 71  Resp:   17 17  Temp:   98.7 F (37.1 C) 98 F (36.7 C)  TempSrc:   Oral Oral  SpO2:   97% 98%  Weight:      Height:        Intake/Output Summary (Last 24 hours) at 02/08/2023 1419 Last data filed at 02/08/2023 0445 Gross per 24 hour   Intake 1410 ml  Output 751 ml  Net 659 ml   Filed Weights   02/05/23 1905 02/06/23 1044  Weight: 96.6 kg 91.2 kg    Data Reviewed: I have personally reviewed and interpreted daily labs, tele strips, imagings as discussed above. I reviewed all nursing notes, pharmacy notes, vitals, pertinent old records I have discussed plan of care as described above with RN and patient/family.  CBC: Recent Labs  Lab 02/02/23 1053 02/05/23 2053 02/06/23 0100 02/07/23 0614 02/08/23 0530  WBC 3.0* 2.4* 1.9* 1.7* 1.4*  NEUTROABS 1.2* 0.5*  --  0.5* 0.3*  HGB 9.9* 9.7* 8.8* 8.3* 8.5*  HCT 31.3* 31.3* 28.8* 26.7* 27.4*  MCV 92.9 94.8 96.0 95.0 96.1  PLT 120* 110* 116* 112* 87*   Basic Metabolic Panel: Recent Labs  Lab 02/02/23 1053 02/05/23 2053 02/06/23 0100 02/06/23 0108 02/07/23 0614 02/08/23 0530  NA 133* 136  --  135 138 139  K 4.4 4.2  --  4.3 4.1 4.2  CL 97* 98  --  98 104 106  CO2 26 31  --  25 28 26   GLUCOSE 93 155*  --  234* 96 159*  BUN 20 14  --  14 17 14   CREATININE 1.84* 1.12 1.22 1.16 1.42* 1.12  CALCIUM 7.8* 7.4*  --  7.4* 7.4* 7.4*  MG  --   --   --  1.4* 1.6* 1.8  PHOS  --   --   --  2.8 3.1 2.8    Studies: No results found.  Scheduled Meds:  Chlorhexidine Gluconate Cloth  6 each Topical Daily   cholestyramine  4 g Oral BID   clotrimazole-betamethasone   Topical BID   ferrous sulfate  325 mg Oral Q breakfast   FLUoxetine  20 mg Oral Daily   folic acid  1 mg Oral Daily   heparin  5,000 Units Subcutaneous Q8H   insulin aspart  0-6 Units Subcutaneous Q4H   insulin glargine-yfgn  5 Units Subcutaneous Daily   lactobacillus  1 g Oral TID WC   [START ON 02/09/2023] levothyroxine  50 mcg Oral QAC breakfast   midodrine  10 mg Oral TID WC   pantoprazole  40 mg Oral BID AC   pravastatin  40 mg Oral QHS   Continuous Infusions:  sodium chloride 75 mL/hr at 02/08/23 0424   cefTRIAXone (ROCEPHIN)  IV 1 g (02/07/23 2357)   PRN Meds: acetaminophen, glucose,  loperamide, morphine injection, ondansetron (ZOFRAN) IV  Time spent: 50 minutes  Author: Gillis Santa. MD [facility] 02/08/2023 2:19 PM  To reach On-call, see care teams to locate the attending and reach out to them via [email or web address]. If 7PM-7AM, please contact night-coverage If you still have difficulty reaching the attending provider, please page the [facility] (Director on Call) for [facility] on amion for assistance.

## 2023-02-09 DIAGNOSIS — E538 Deficiency of other specified B group vitamins: Secondary | ICD-10-CM

## 2023-02-09 DIAGNOSIS — F32A Depression, unspecified: Secondary | ICD-10-CM

## 2023-02-09 DIAGNOSIS — E109 Type 1 diabetes mellitus without complications: Secondary | ICD-10-CM

## 2023-02-09 DIAGNOSIS — R8281 Pyuria: Secondary | ICD-10-CM

## 2023-02-09 DIAGNOSIS — N179 Acute kidney failure, unspecified: Secondary | ICD-10-CM

## 2023-02-09 DIAGNOSIS — K529 Noninfective gastroenteritis and colitis, unspecified: Secondary | ICD-10-CM

## 2023-02-09 DIAGNOSIS — I951 Orthostatic hypotension: Secondary | ICD-10-CM

## 2023-02-09 DIAGNOSIS — S12631A Unspecified traumatic nondisplaced spondylolisthesis of seventh cervical vertebra, initial encounter for closed fracture: Secondary | ICD-10-CM

## 2023-02-09 DIAGNOSIS — D649 Anemia, unspecified: Secondary | ICD-10-CM

## 2023-02-09 LAB — CBC WITH DIFFERENTIAL/PLATELET
Abs Immature Granulocytes: 0 10*3/uL (ref 0.00–0.07)
Basophils Absolute: 0 10*3/uL (ref 0.0–0.1)
Basophils Relative: 1 %
Eosinophils Absolute: 0.1 10*3/uL (ref 0.0–0.5)
Eosinophils Relative: 6 %
HCT: 27.1 % — ABNORMAL LOW (ref 39.0–52.0)
Hemoglobin: 8.3 g/dL — ABNORMAL LOW (ref 13.0–17.0)
Immature Granulocytes: 0 %
Lymphocytes Relative: 37 %
Lymphs Abs: 0.5 10*3/uL — ABNORMAL LOW (ref 0.7–4.0)
MCH: 29.7 pg (ref 26.0–34.0)
MCHC: 30.6 g/dL (ref 30.0–36.0)
MCV: 97.1 fL (ref 80.0–100.0)
Monocytes Absolute: 0.3 10*3/uL (ref 0.1–1.0)
Monocytes Relative: 24 %
Neutro Abs: 0.4 10*3/uL — CL (ref 1.7–7.7)
Neutrophils Relative %: 32 %
Platelets: 88 10*3/uL — ABNORMAL LOW (ref 150–400)
RBC: 2.79 MIL/uL — ABNORMAL LOW (ref 4.22–5.81)
RDW: 15.6 % — ABNORMAL HIGH (ref 11.5–15.5)
WBC: 1.4 10*3/uL — CL (ref 4.0–10.5)
nRBC: 0 % (ref 0.0–0.2)

## 2023-02-09 LAB — GLUCOSE, CAPILLARY
Glucose-Capillary: 138 mg/dL — ABNORMAL HIGH (ref 70–99)
Glucose-Capillary: 200 mg/dL — ABNORMAL HIGH (ref 70–99)
Glucose-Capillary: 196 mg/dL — ABNORMAL HIGH (ref 70–99)
Glucose-Capillary: 232 mg/dL — ABNORMAL HIGH (ref 70–99)
Glucose-Capillary: 188 mg/dL — ABNORMAL HIGH (ref 70–99)

## 2023-02-09 LAB — BASIC METABOLIC PANEL
Anion gap: 7 (ref 5–15)
BUN: 12 mg/dL (ref 8–23)
CO2: 25 mmol/L (ref 22–32)
Calcium: 7.3 mg/dL — ABNORMAL LOW (ref 8.9–10.3)
Chloride: 107 mmol/L (ref 98–111)
Creatinine, Ser: 0.97 mg/dL (ref 0.61–1.24)
GFR, Estimated: >60 mL/min (ref >60)
Glucose, Bld: 194 mg/dL — ABNORMAL HIGH (ref 70–99)
Potassium: 4.4 mmol/L (ref 3.5–5.1)
Sodium: 139 mmol/L (ref 135–145)

## 2023-02-09 LAB — PHOSPHORUS: Phosphorus: 2.8 mg/dL (ref 2.5–4.6)

## 2023-02-09 LAB — MAGNESIUM: Magnesium: 1.7 mg/dL (ref 1.7–2.4)

## 2023-02-09 NOTE — Care Management Important Message (Signed)
Important Message  Patient Details  Name: Gary [name] MRN: [id number] Date of Birth: [birth year]   Medicare Important Message Given:  N/A - LOS <3 / Initial given by admissions     [name] 02/09/2023, 8:24 AM

## 2023-02-09 NOTE — Progress Notes (Signed)
[facility] Progress Note  Patient: Gary [name]    WUJ:[id number]  DOA: 02/05/2023     Date of Service: the patient was seen and examined on 02/09/2023  Chief Complaint  Patient presents with   Fall   Brief hospital course: Gary [name] is a 77 y.o. male with medical history significant of  recurrent stage III mantle cell lymphoma s/p 43 cycles of Rituxan and Treanda, depression, insulin-dependent diabetes mellitus, hypertension, hyperlipidemia, who presents to ED with increase falls over the last week at [facility]. Patient of note has interim history of admission 01/12/2023-01/25/23 at which time patient was treated for  Sepsis due to cellulitis, as well as AKI. Patient at that time had presented to ED with generalized weakness and hypotension.  Patient also has interim history of colonoscopy on 02/04/23 with results noting polyp descending colon/hemorrhoids otherwise normal :Patient currently states he was attempting to go to the bathroom and his leg gave out on him and he was not able to use his arms to hold himself up due to being weak. Sister also aides in history she is concerned about current rehab has has had 3 falls since being admitted on 4/15. She is also concerned about his lack of appetite. She also note him picking at the air when he sleeps which is new for him and she is concerned about medication side-effect. Patient notes no fever/chills/, but does have nausea , no current abdominal pain or chest pain.   ED Course:  Afeb, bp 121/58, HR78, rr 18 sat 100%  UA : LE Moderate, bacteria rare CTH/Cervical spine IMPRESSION: 1. Distraction injury at C6-C7, with fracture through the right anterior superior C7 endplate and widening of the anterior aspect of the disc space at C6-C7. Prevertebral fluid anterior to C4-T2. 2. No acute intracranial process. 3. Small right pleural effusion.   Cxr: IMPRESSION: Chronic interstitial changes consistent with fibrosis. No evidence of active  pulmonary disease.   Wbc 2.4, hgb 9.7 at baseline,plt 110 Inr 1.4  NA 136, K 4.2, glu 155,  Xray pelvis; Osteopenia. No displaced fractures are visualized. If there is high clinical concern for hip fracture recommend dedicated hip views.  EKG ventricular paced  UA + wbc , LE, urine culture negative Tx tylenol, ultram    Assessment and Plan:  # Fracture of C7 endplate s/p Fall due to orthostatic hypotension Prevertebral fluid anterior to C4-T2 -case discussed with neurosurgery Dr Welton Flakes who rec cervical collar and out patient follow up  -supportive pain regimen  -Fall due to orthostatic hypotension, and low volume from chronic diarrhea  4/30 patient blood pressure dropped from sitting to standing position, positive for orthostatic vital signs, unable to stand by himself, 2 people assist, while taking orthostatics. Continue fall precautions, ambulate with assistance, turn patient every 2 hours Continue aspiration precautions -PT/OT evaluate done SLP eval done, started on dysphagia 2 diet with thin liquids     # Pyuria, UA positive, urine culture insufficient growth.  SNF was ceftriaxone, discontinued antibiotics on 4/29.     # AKI, baseline creatinine 1.03 on 01/1523 4/28 Creatinine 1.42--0.97 improving Monitor renal functions daily Bladder scan 94, ruled out urinary retention Continue IV fluid for gentle hydration    Hypotension, persistent -requiring midodrine  Recurrent stage III mantle cell lymphoma  -s/p 43 cycles of Rituxan and Treanda -Last chemo was in January 2024 -pancytopenia, due to malignancy, stable, no fever. -followed by oncology as outpatient Dr Orlie Dakin  4/29 texted to Dr. Orlie Dakin for neutropenia,  recommended no need of Zarzio at this time as patient is afebrile and no signs of infection.  Folic acid deficiency, folate level 3.3, started folic acid 1 mg p.o. daily Anemia most likely secondary to chronic disease and underlying malignancy. Iron profile and  B12 level within normal range Monitor H&H and transfuse if hemoglobin less than 7  Chronic diarrhea  -began with chemo  -followed by GI  -recent c-scope 4/25 noted only polyp and hemorrhoids  -possible cause of patient falls due to orthostasis  4/30 no BM for past 2 days as per RN, seems send diarrhea improving.  Discontinued cholestyramine.  Depression, continue ssri    Type 1 diabetes mellitus  Uncontrolled blood glucose could be due to lack of insulin use Diabetic coordinator consulted, Started Semglee 5 units daily and NovoLog sliding scale, monitor CBG Continue diabetic diet 4/28 CBGs low today, held Semglee   Hyperlipidemia -continue statin   Hypomagnesemia, mag repleted. Monitor electrolytes and replete as needed.   Hypothyroid, patient was on Synthroid 50 mcg p.o. daily TSH 5.8, elevated, could be due to acute illness. Continue Synthroid 50 mcg daily, repeat TSH level after 4 to 6 weeks.   Right lower extremity chronic wound and bilateral lower extremity pigmentation and dry skin Wound care RN consulted Started Lotrisone cream BID  Body mass index is 29.69 kg/m.  Interventions:   Pressure Injury 01/12/23 Buttocks Right;Left Stage 2 -  Partial thickness loss of dermis presenting as a shallow open injury with a red, pink wound bed without slough. (Active)  01/12/23 2330  Location: Buttocks  Location Orientation: Right;Left  Staging: Stage 2 -  Partial thickness loss of dermis presenting as a shallow open injury with a red, pink wound bed without slough.  Wound Description (Comments):   Present on Admission: Yes  Dressing Type Foam - Lift dressing to assess site every shift 02/09/23 0821     Pressure Injury 01/14/23 Coccyx Bilateral Deep Tissue Pressure Injury - Purple or maroon localized area of discolored intact skin or blood-filled blister due to damage of underlying soft tissue from pressure and/or shear. 4 DTI areas noted (Active)  01/14/23 1751  Location:  Coccyx  Location Orientation: Bilateral  Staging: Deep Tissue Pressure Injury - Purple or maroon localized area of discolored intact skin or blood-filled blister due to damage of underlying soft tissue from pressure and/or shear.  Wound Description (Comments): 4 DTI areas noted  Present on Admission:   Dressing Type Foam - Lift dressing to assess site every shift 02/09/23 1610     Diet: Diabetic diet DVT Prophylaxis: Subcutaneous Heparin    Advance goals of care discussion: Full code  Family Communication: family was not present at bedside, at the time of interview.  The pt provided permission to discuss medical plan with the family. Opportunity was given to ask question and all questions were answered satisfactorily.   Disposition:  Pt is from [facility], admitted with fall and C7 Fx and uncontrolled DM, still has C-collar. CBG is well controlled now, but has neutropenia, which precludes a safe discharge. Discharge to SNF, when clinically stable, most likely tomorrow a.m. [facility] consulted for discharge planning  Subjective: No significant events overnight, patient was sitting at the edge of the bed comfortably, denied any complaints, patient stated that he does not want to go back to the same facility and would like to go to a different rehab facility. I informed to [facility], they are working for placement.   Physical Exam: General: NAD, lying comfortably  Appear in no distress, affect appropriate Eyes: PERRLA ENT: Oral Mucosa Clear, moist,  Neck: no JVD, cervical collar intact Cardiovascular: S1 and S2 Present, no Murmur,  Respiratory: good respiratory effort, Bilateral Air entry equal and Decreased, no Crackles, no wheezes Abdomen: Bowel Sound present, Soft and no tenderness,  Skin: no rashes Extremities: Mild edema, right lower extremity chronic wound, chronic pigmentation, dryness is improving with topical cream. Neurologic: without any new focal findings Gait not checked due to patient  safety concerns  Vitals:   02/09/23 0418 02/09/23 0735 02/09/23 0900 02/09/23 1206  BP: (!) 121/49 (!) 105/45  101/62  Pulse: 81 76  83  Resp: 18 16  18   Temp: 98.6 F (37 C) (!) 97.4 F (36.3 C)    TempSrc:  Oral    SpO2: 100% 100% 100% 100%  Weight:      Height:        Intake/Output Summary (Last 24 hours) at 02/09/2023 1449 Last data filed at 02/09/2023 1406 Gross per 24 hour  Intake 520 ml  Output 100 ml  Net 420 ml   Filed Weights   02/05/23 1905 02/06/23 1044  Weight: 96.6 kg 91.2 kg    Data Reviewed: I have personally reviewed and interpreted daily labs, tele strips, imagings as discussed above. I reviewed all nursing notes, pharmacy notes, vitals, pertinent old records I have discussed plan of care as described above with RN and patient/family.  CBC: Recent Labs  Lab 02/05/23 2053 02/06/23 0100 02/07/23 0614 02/08/23 0530 02/09/23 0615  WBC 2.4* 1.9* 1.7* 1.4* 1.4*  NEUTROABS 0.5*  --  0.5* 0.3* 0.4*  HGB 9.7* 8.8* 8.3* 8.5* 8.3*  HCT 31.3* 28.8* 26.7* 27.4* 27.1*  MCV 94.8 96.0 95.0 96.1 97.1  PLT 110* 116* 112* 87* 88*   Basic Metabolic Panel: Recent Labs  Lab 02/05/23 2053 02/06/23 0100 02/06/23 0108 02/07/23 0614 02/08/23 0530 02/09/23 0615  NA 136  --  135 138 139 139  K 4.2  --  4.3 4.1 4.2 4.4  CL 98  --  98 104 106 107  CO2 31  --  25 28 26 25   GLUCOSE 155*  --  234* 96 159* 194*  BUN 14  --  14 17 14 12   CREATININE 1.12 1.22 1.16 1.42* 1.12 0.97  CALCIUM 7.4*  --  7.4* 7.4* 7.4* 7.3*  MG  --   --  1.4* 1.6* 1.8 1.7  PHOS  --   --  2.8 3.1 2.8 2.8    Studies: No results found.  Scheduled Meds:  Chlorhexidine Gluconate Cloth  6 each Topical Daily   clotrimazole-betamethasone   Topical BID   ferrous sulfate  325 mg Oral Q breakfast   FLUoxetine  20 mg Oral Daily   folic acid  1 mg Oral Daily   heparin  5,000 Units Subcutaneous Q8H   insulin aspart  0-6 Units Subcutaneous Q4H   insulin glargine-yfgn  5 Units Subcutaneous Daily    lactobacillus  1 g Oral TID WC   levothyroxine  50 mcg Oral QAC breakfast   midodrine  10 mg Oral TID WC   pantoprazole  40 mg Oral BID AC   pravastatin  40 mg Oral QHS   Continuous Infusions:  sodium chloride 50 mL/hr at 02/09/23 1210   PRN Meds: acetaminophen, glucose, loperamide, morphine injection, ondansetron (ZOFRAN) IV  Time spent: 40 minutes  Author: Gillis Santa. MD [facility] 02/09/2023 2:49 PM  To reach On-call, see care teams to  locate the attending and reach out to them via [email or web address]. If 7PM-7AM, please contact night-coverage If you still have difficulty reaching the attending provider, please page the [facility] (Director on Call) for [facility] on amion for assistance.

## 2023-02-09 NOTE — Progress Notes (Signed)
Palliative Care Progress Note, Assessment & Plan   Patient Name: Gary [name]       Date: 02/09/2023 DOB: 28-Apr-1946  Age: 77 y.o. MRN#: [id number] Attending Physician: Gary Santa, MD Primary Care Physician: Gary Gaston, MD Admit Date: 02/05/2023  Subjective: Patient is out of bed and working with PT with walker for mobilization.  No family or friends present at bedside.  HPI: 77 y.o. male  with past medical history of recurrent stage III mantle cell lymphoma last chemo treatment 11/06/2022 with last PET scan 11/2022 showing significant interval response to therapy, DM1,CHF, HTN, HLD  admitted from [facility] on 02/05/2023 with frequent falls.   Unwitnessed fall 4/26 with head trauma with associated neck pain   CTH/Cervical spine IMPRESSION: 1. Distraction injury at C6-C7, with fracture through the right anterior superior C7 endplate and widening of the anterior aspect of the disc space at C6-C7. Prevertebral fluid anterior to C4-T2. 2. No acute intracranial process. 3. Small right pleural effusion.   Noted recent hospitalization 01/12/2023-01/25/2023 treated for sepsis secondary to LE cellulitis as well as AKI    Ongoing issue with diarrhea noted since January-not felt to be related to chemo treatments, continued on Questran and as needed Imodium  Has been seen by GI as outpatient Colonoscopy 02/04/2023-findings of polyps in ascending colon otherwise normal  Summary of counseling/coordination of care: After reviewing the patient's chart and assessing the patient at bedside, I counseled with patient's dayshift RN who shares concerns that patient has not had a healthy appetite today.  I spoke with patient's sister over the phone. We discussed advance care planning, code status, symptom  management, and goals of care.  Gary [name] has patient's HCPOA paperwork and plans to send it electronically for download to the chart.  I reviewed my discussion with patient yesterday in regards to symptom management, advanced care planning, next of kin decision maker and code status.  Gary [name] shares she believes patient would WANT resuscitative efforts in the event that he is pulseless and not breathing.  I outlined that patient shared very difference view yesterday wherein he shared he would not want CPR/shocks/pressors/use of mechanical ventilatory support in the event of a cardiopulmonary arrest.   We discussed difference between full code and DNR status as well as full and limited interventions. Ongoing discussion and education to continue.   Gary [name] shares patient's wishes are clearly outlined in his living well which she plans to send electronically today.  I introduced the concept of a MOST form.  Discussed this is another form of documentation for patient to make his wishes known.  Gary [name] shares she is interested in reading a MOST form and discussing it with patient.  Copy of MOST form left at bedside for patient/family's review.  As far as symptom burden, I conveyed to Gary [name] that patient's diarrhea has significantly improved during this hospitalization.  No adjustments to medications needed at this time.  Discussed functional, nutritional, and cognitive status is important indicators of patient's overall prognosis with Gary [name].  Questions and concerns were addressed.  Therapeutic silence and active listening provided for Gary [name] to share her thoughts and emotions regarding patient's current medical situation.  Emotional support provided. Gary [name] shares concerns that  she does not want patient to return to [facility].  She is interested in patient going to a facility at [facility].  TOC made aware (via secure chat message) of sister's wishes for disposition.  PMT will continue to follow.  Physical  Exam Vitals reviewed.  Constitutional:      General: He is not in acute distress.    Appearance: He is normal weight.  HENT:     Mouth/Throat:     Mouth: Mucous membranes are moist.  Eyes:     Pupils: Pupils are equal, round, and reactive to light.  Cardiovascular:     Rate and Rhythm: Normal rate.     Pulses: Normal pulses.  Pulmonary:     Effort: Pulmonary effort is normal.  Abdominal:     Palpations: Abdomen is soft.  Musculoskeletal:     Comments: Generalized weakness  Skin:    General: Skin is warm and dry.  Neurological:     Mental Status: He is alert and oriented to person, place, and time.  Psychiatric:        Behavior: Behavior normal.        Thought Content: Thought content normal.        Judgment: Judgment normal.             Total Time 35 minutes   Naol Ontiveros L. Manon Hilding, FNP-BC Palliative Medicine Team Team Phone # [phone]

## 2023-02-09 NOTE — Progress Notes (Addendum)
Physical Therapy Treatment Patient Details Name: Gary [name] MRN: [id number] DOB: 10-28-1945 Today's Date: 02/09/2023   History of Present Illness Gary [name] is a 77 y.o. male with medical history significant of   recurrent stage III mantle cell lymphoma s/p 43 cycles of Rituxan and Treanda, depression, insulin-dependent diabetes mellitus, hypertension, hyperlipidemia, who presents to ED with increase falls over the last week at [facility] nursing home. Pt had been discharged to [facility]) after hospital admission in mid April 2024. Prior to recent hospitalization he was living at home alone. He has experienced multiple falls over last month. Pt also reports increased difficulty swallowing over last month. Cervical X-ray reveals Distraction injury at C6-C7, with fracture through the right  anterior superior C7 endplate and widening of the anterior aspect of  the disc space at C6-C7. Prevertebral fluid anterior to C4-T2. Neurosurgery Dr Welton Flakes recommends cervical collar and out patient follow up.    PT Comments    Pt ready for session.  Transitions to EOB with mod a x 1.  Stood x 3 with RW and min/mod a x 2.  He is able to sidestep left/right along bed with encouragement.  He is steady in sitting today and sits 15+ minutes today before fatigue.  Neck brace adjusted as pads had slipped and hard plastic along brace against skin.  No breakdown noted and fixed to appropriate fit.  Returned to supine with mod a x 2 for comfort.  Recommend +2 for standing/gait interventions.  Pt did get to [facility] with nursing today.  Co-tx with OT for pt and staff safety.  1 unit billed each.   Recommendations for follow up therapy are one component of a multi-disciplinary discharge planning process, led by the attending physician.  Recommendations may be updated based on patient status, additional functional criteria and insurance authorization.  Follow Up Recommendations  Can patient physically be  transported by private vehicle: No    Assistance Recommended at Discharge Frequent or constant Supervision/Assistance  Patient can return home with the following Two people to help with walking and/or transfers;A lot of help with bathing/dressing/bathroom;Assistance with cooking/housework;Assist for transportation;Help with stairs or ramp for entrance;Direct supervision/assist for medications management;Direct supervision/assist for financial management   Equipment Recommendations  Other (comment)    Recommendations for Other Services       Precautions / Restrictions Precautions Precautions: Fall;Cervical Precaution Comments: Cervical collar; C7 fracture Restrictions Weight Bearing Restrictions: No     Mobility  Bed Mobility Overal bed mobility: Needs Assistance Bed Mobility: Supine to Sit, Sit to Supine     Supine to sit: Mod assist Sit to supine: Mod assist, +2 for physical assistance   General bed mobility comments: increased comfort with +2 assist    Transfers Overall transfer level: Needs assistance Equipment used: Rolling walker (2 wheels) Transfers: Sit to/from Stand Sit to Stand: Min assist, From elevated surface, +2 physical assistance, Mod assist           General transfer comment: x 3 during session    Ambulation/Gait Ambulation/Gait assistance: Mod assist, +2 physical assistance, Min assist Gait Distance (Feet): 5 Feet Assistive device: Rolling walker (2 wheels) Gait Pattern/deviations: Step-to pattern Gait velocity: decreased     General Gait Details: remains fearful with steps but does well with encouragement   Stairs             Wheelchair Mobility    Modified Rankin (Stroke Patients Only)       Balance  Cognition Arousal/Alertness: Awake/alert Behavior During Therapy: WFL for tasks assessed/performed Overall Cognitive Status: Within Functional Limits for tasks  assessed                                          Exercises      General Comments        Pertinent Vitals/Pain Pain Assessment Pain Assessment: Faces Faces Pain Scale: Hurts even more Pain Location: overall improved today and did not holler out wih transitions today Pain Descriptors / Indicators: Sore, Grimacing, Guarding Pain Intervention(s): Limited activity within patient's tolerance, Monitored during session, Repositioned    Home Living                          Prior Function            PT Goals (current goals can now be found in the care plan section) Progress towards PT goals: Progressing toward goals    Frequency    7X/week      PT Plan Current plan remains appropriate    Co-evaluation PT/OT/SLP Co-Evaluation/Treatment: Yes Reason for Co-Treatment: Complexity of the patient's impairments (multi-system involvement);For patient/therapist safety;To address functional/ADL transfers PT goals addressed during session: Balance;Mobility/safety with mobility;Proper use of DME OT goals addressed during session: ADL's and self-care      AM-PAC PT "6 Clicks" Mobility   Outcome Measure  Help needed turning from your back to your side while in a flat bed without using bedrails?: A Lot Help needed moving from lying on your back to sitting on the side of a flat bed without using bedrails?: A Lot Help needed moving to and from a bed to a chair (including a wheelchair)?: A Lot Help needed standing up from a chair using your arms (e.g., wheelchair or bedside chair)?: A Lot Help needed to walk in hospital room?: A Lot Help needed climbing 3-5 steps with a railing? : Total 6 Click Score: 11    End of Session Equipment Utilized During Treatment: Gait belt;Cervical collar Activity Tolerance: Patient limited by fatigue;Patient limited by pain Patient left: in bed;with call bell/phone within reach;with bed alarm set Nurse Communication: Mobility  status;Precautions;Weight bearing status PT Visit Diagnosis: Muscle weakness (generalized) (M62.81);Difficulty in walking, not elsewhere classified (R26.2);Unsteadiness on feet (R26.81);Repeated falls (R29.6)     Time: 1345-1410 PT Time Calculation (min) (ACUTE ONLY): 25 min  Charges:  $Therapeutic Exercise: 8-22 mins                   Danielle Dess, PTA 02/09/23, 2:24 PM

## 2023-02-09 NOTE — Progress Notes (Signed)
Occupational Therapy Treatment Patient Details Name: Gary [name] MRN: [id number] DOB: 1946-08-04 Today's Date: 02/09/2023   History of present illness Gary [name] is a 77 y.o. male with medical history significant of   recurrent stage III mantle cell lymphoma s/p 43 cycles of Rituxan and Treanda, depression, insulin-dependent diabetes mellitus, hypertension, hyperlipidemia, who presents to ED with increase falls over the last week at [facility] nursing home. Pt had been discharged to [facility]) after hospital admission in mid April 2024. Prior to recent hospitalization he was living at home alone. He has experienced multiple falls over last month. Pt also reports increased difficulty swallowing over last month. Cervical X-ray reveals Distraction injury at C6-C7, with fracture through the right  anterior superior C7 endplate and widening of the anterior aspect of  the disc space at C6-C7. Prevertebral fluid anterior to C4-T2. Neurosurgery Dr Welton Flakes recommends cervical collar and out patient follow up.   OT comments  Patient received sitting at EOB with PT present. Pt agreeable to OT/PT co-treatment to maximize safety and participation. Pt engaged in seated grooming tasks with set up-supervision. Pt completed STS 3x from EOB and took side steps with Min-Mod A +2 using RW. Pt slightly anxious/fearful with OOB mobility, benefits from encouragement. Rest breaks provided t/o session. Pt endorsed neck/back pain with activity. He required Mod A +2 to return to supine and was left with all needs in reach. Pt is making progress toward goal completion. D/C recommendation remains appropriate. OT will continue to follow acutely.    Recommendations for follow up therapy are one component of a multi-disciplinary discharge planning process, led by the attending physician.  Recommendations may be updated based on patient status, additional functional criteria and insurance authorization.    Assistance  Recommended at Discharge Frequent or constant Supervision/Assistance  Patient can return home with the following  Two people to help with walking and/or transfers;Assistance with cooking/housework;Direct supervision/assist for medications management;Direct supervision/assist for financial management;Assist for transportation;Help with stairs or ramp for entrance;A lot of help with bathing/dressing/bathroom   Equipment Recommendations  Other (comment) (defer to next venue of care)    Recommendations for Other Services      Precautions / Restrictions Precautions Precautions: Fall;Cervical Precaution Comments: Cervical collar; C7 fracture Restrictions Weight Bearing Restrictions: No       Mobility Bed Mobility Overal bed mobility: Needs Assistance Bed Mobility: Sit to Supine       Sit to supine: Mod assist, +2 for physical assistance        Transfers Overall transfer level: Needs assistance Equipment used: Rolling walker (2 wheels) Transfers: Sit to/from Stand Sit to Stand: Min assist, From elevated surface, +2 physical assistance, Mod assist           General transfer comment: STS 3x from EOB     Balance Overall balance assessment: Needs assistance Sitting-balance support: Feet supported, Bilateral upper extremity supported Sitting balance-Leahy Scale: Fair     Standing balance support: Bilateral upper extremity supported, Reliant on assistive device for balance, During functional activity Standing balance-Leahy Scale: Fair       ADL either performed or assessed with clinical judgement   ADL Overall ADL's : Needs assistance/impaired     Grooming: Set up;Supervision/safety;Sitting;Wash/dry face                   Toilet Transfer: Minimal assistance;Moderate assistance;+2 for physical assistance;Rolling walker (2 wheels) Toilet Transfer Details (indicate cue type and reason): simulated  Functional mobility during ADLs: Minimal  assistance;Moderate assistance;+2 for physical assistance;Rolling walker (2 wheels) (~24ft at EOB)      Extremity/Trunk Assessment Upper Extremity Assessment Upper Extremity Assessment: Generalized weakness   Lower Extremity Assessment Lower Extremity Assessment: Generalized weakness        Vision Baseline Vision/History: 1 Wears glasses Patient Visual Report: No change from baseline     Perception     Praxis      Cognition Arousal/Alertness: Awake/alert Behavior During Therapy: WFL for tasks assessed/performed Overall Cognitive Status: Within Functional Limits for tasks assessed       General Comments: Slightly anxious/fearful with OOB mobility, benefits from encouragement        Exercises      Shoulder Instructions       General Comments      Pertinent Vitals/ Pain       Pain Assessment Pain Assessment: Faces Faces Pain Scale: Hurts even more Pain Location: neck/back Pain Descriptors / Indicators: Sore, Grimacing, Guarding Pain Intervention(s): Limited activity within patient's tolerance, Monitored during session, Repositioned  Home Living            Prior Functioning/Environment              Frequency  Min 1X/week        Progress Toward Goals  OT Goals(current goals can now be found in the care plan section)  Progress towards OT goals: Progressing toward goals  Acute Rehab OT Goals Patient Stated Goal: to feel better OT Goal Formulation: With patient Time For Goal Achievement: 02/20/23 Potential to Achieve Goals: Fair  Plan Discharge plan remains appropriate;Frequency remains appropriate    Co-evaluation    PT/OT/SLP Co-Evaluation/Treatment: Yes Reason for Co-Treatment: Complexity of the patient's impairments (multi-system involvement);For patient/therapist safety;To address functional/ADL transfers PT goals addressed during session: Balance;Mobility/safety with mobility;Proper use of DME OT goals addressed during session: ADL's  and self-care;Proper use of Adaptive equipment and DME      AM-PAC OT "6 Clicks" Daily Activity     Outcome Measure   Help from another person eating meals?: A Little Help from another person taking care of personal grooming?: A Little Help from another person toileting, which includes using toliet, bedpan, or urinal?: A Lot Help from another person bathing (including washing, rinsing, drying)?: A Lot Help from another person to put on and taking off regular upper body clothing?: A Little Help from another person to put on and taking off regular lower body clothing?: A Lot 6 Click Score: 15    End of Session Equipment Utilized During Treatment: Rolling walker (2 wheels);Gait belt;Cervical collar  OT Visit Diagnosis: Unsteadiness on feet (R26.81);Repeated falls (R29.6)   Activity Tolerance Patient tolerated treatment well   Patient Left in bed;with call bell/phone within reach;with bed alarm set   Nurse Communication Mobility status        Time: 1348-1410 OT Time Calculation (min): 22 min  Charges: OT General Charges $OT Visit: 1 Visit OT Treatments $Self Care/Home Management : 8-22 mins  [facility] MS, OTR/L ascom [phone]  02/09/23, 4:50 PM

## 2023-02-10 DIAGNOSIS — S12601A Unspecified nondisplaced fracture of seventh cervical vertebra, initial encounter for closed fracture: Secondary | ICD-10-CM | POA: Diagnosis not present

## 2023-02-10 DIAGNOSIS — W19XXXA Unspecified fall, initial encounter: Secondary | ICD-10-CM | POA: Diagnosis not present

## 2023-02-10 DIAGNOSIS — W19XXXD Unspecified fall, subsequent encounter: Secondary | ICD-10-CM | POA: Diagnosis not present

## 2023-02-10 DIAGNOSIS — Z66 Do not resuscitate: Secondary | ICD-10-CM

## 2023-02-10 LAB — CBC WITH DIFFERENTIAL/PLATELET
Abs Immature Granulocytes: 0.15 10*3/uL — ABNORMAL HIGH (ref 0.00–0.07)
Basophils Absolute: 0 10*3/uL (ref 0.0–0.1)
Basophils Relative: 1 %
Eosinophils Absolute: 0.1 10*3/uL (ref 0.0–0.5)
Eosinophils Relative: 5 %
HCT: 28.6 % — ABNORMAL LOW (ref 39.0–52.0)
Hemoglobin: 9 g/dL — ABNORMAL LOW (ref 13.0–17.0)
Immature Granulocytes: 10 %
Lymphocytes Relative: 42 %
Lymphs Abs: 0.6 10*3/uL — ABNORMAL LOW (ref 0.7–4.0)
MCH: 30.3 pg (ref 26.0–34.0)
MCHC: 31.5 g/dL (ref 30.0–36.0)
MCV: 96.3 fL (ref 80.0–100.0)
Monocytes Absolute: 0.4 10*3/uL (ref 0.1–1.0)
Monocytes Relative: 24 %
Neutro Abs: 0.3 10*3/uL — CL (ref 1.7–7.7)
Neutrophils Relative %: 18 %
Platelets: 103 10*3/uL — ABNORMAL LOW (ref 150–400)
RBC: 2.97 MIL/uL — ABNORMAL LOW (ref 4.22–5.81)
RDW: 15.7 % — ABNORMAL HIGH (ref 11.5–15.5)
Smear Review: NORMAL
WBC: 1.6 10*3/uL — ABNORMAL LOW (ref 4.0–10.5)
nRBC: 0 % (ref 0.0–0.2)

## 2023-02-10 LAB — BASIC METABOLIC PANEL
Anion gap: 6 (ref 5–15)
BUN: 12 mg/dL (ref 8–23)
CO2: 24 mmol/L (ref 22–32)
Calcium: 7.4 mg/dL — ABNORMAL LOW (ref 8.9–10.3)
Chloride: 109 mmol/L (ref 98–111)
Creatinine, Ser: 1.02 mg/dL (ref 0.61–1.24)
GFR, Estimated: >60 mL/min (ref >60)
Glucose, Bld: 181 mg/dL — ABNORMAL HIGH (ref 70–99)
Potassium: 4.2 mmol/L (ref 3.5–5.1)
Sodium: 139 mmol/L (ref 135–145)

## 2023-02-10 LAB — GLUCOSE, CAPILLARY
Glucose-Capillary: 151 mg/dL — ABNORMAL HIGH (ref 70–99)
Glucose-Capillary: 153 mg/dL — ABNORMAL HIGH (ref 70–99)
Glucose-Capillary: 159 mg/dL — ABNORMAL HIGH (ref 70–99)
Glucose-Capillary: 173 mg/dL — ABNORMAL HIGH (ref 70–99)
Glucose-Capillary: 178 mg/dL — ABNORMAL HIGH (ref 70–99)
Glucose-Capillary: 203 mg/dL — ABNORMAL HIGH (ref 70–99)

## 2023-02-10 MED ORDER — CALCIUM CARBONATE 1250 (500 CA) MG PO TABS
1.0000 | ORAL_TABLET | Freq: Two times a day (BID) | ORAL | Status: DC
  Administered 2023-02-10 – 2023-02-17 (×14): 1250 mg via ORAL
  Filled 2023-02-10 (×16): qty 1

## 2023-02-10 MED ORDER — GLUCERNA SHAKE PO LIQD
237.0000 mL | Freq: Three times a day (TID) | ORAL | Status: DC
  Administered 2023-02-10 – 2023-02-17 (×13): 237 mL via ORAL

## 2023-02-10 NOTE — Progress Notes (Signed)
Physical Therapy Treatment Patient Details Name: Gary [name] MRN: [id number] DOB: 12-02-1945 Today's Date: 02/10/2023   History of Present Illness Gary [name] is a 77 y.o. male with medical history significant of   recurrent stage III mantle cell lymphoma s/p 43 cycles of Rituxan and Treanda, depression, insulin-dependent diabetes mellitus, hypertension, hyperlipidemia, who presents to ED with increase falls over the last week at [facility] nursing home. Pt had been discharged to [facility]) after hospital admission in mid April 2024. Prior to recent hospitalization he was living at home alone. He has experienced multiple falls over last month. Pt also reports increased difficulty swallowing over last month. Cervical X-ray reveals Distraction injury at C6-C7, with fracture through the right  anterior superior C7 endplate and widening of the anterior aspect of  the disc space at C6-C7. Prevertebral fluid anterior to C4-T2. Neurosurgery Dr Welton Flakes recommends cervical collar and out patient follow up.    PT Comments    Pt was long sitting in bed upon arrival. He is A and O x 4 and agreeable to session. Pt requires increased time for all task requested of him. He was unaware of BM he had in bed. Author assisted with hygiene care with pt standing EOB. Poor standing balance with several occasions of LOB posteriorly. Session did progress to ambulation in room however pt is severely deconditioned. Vitals remained stable but pt is SOB. Required extensive assistance to return to bed and reposition to [facility]. Overall pt is progressing but slowly. Highly recommend continued skilled PT at DC to maximize his independence and safety with all ADLs.   Recommendations for follow up therapy are one component of a multi-disciplinary discharge planning process, led by the attending physician.  Recommendations may be updated based on patient status, additional functional criteria and insurance authorization.      Assistance Recommended at Discharge Frequent or constant Supervision/Assistance  Patient can return home with the following A lot of help with bathing/dressing/bathroom;Assistance with cooking/housework;Assist for transportation;Help with stairs or ramp for entrance;Direct supervision/assist for medications management;Direct supervision/assist for financial management;A lot of help with walking and/or transfers   Equipment Recommendations  Other (comment) (Defer to next level of care)       Precautions / Restrictions Precautions Precautions: Fall;Cervical Precaution Comments: Cervical collar; C7 fracture Restrictions Weight Bearing Restrictions: No     Mobility  Bed Mobility Overal bed mobility: Needs Assistance Bed Mobility: Rolling, Sidelying to Sit, Supine to Sit, Sit to Supine, Sit to Sidelying Rolling: Min assist, Mod assist (incrseased time required) Sidelying to sit: Mod assist Supine to sit: Mod assist Sit to supine: Max assist Sit to sidelying: Max assist General bed mobility comments: Pt was able to log roll R with increased time + mod assist to achieve short sit EOB. pt is slow moving and was cued for step by step sequencing. sat EOB x several minutes prior to standing. poor flexed posture throughout session in sitting and standing    Transfers Overall transfer level: Needs assistance Equipment used: Rolling walker (2 wheels) Transfers: Sit to/from Stand Sit to Stand: Min assist, Mod assist  General transfer comment: min assist from elevated surfces. mod assist from lower. Pt has severe posterior push and several occasions of LOB posteriorly    Ambulation/Gait Ambulation/Gait assistance: Min assist, Mod assist Gait Distance (Feet): 15 Feet Assistive device: Rolling walker (2 wheels) Gait Pattern/deviations: Step-to pattern, Narrow base of support, Trunk flexed Gait velocity: decreased  General Gait Details: Pt was able to ambulate  in room ~ 15 ft with slow step to  pattern. SOB noted howecver sao2 and HR stable. pt has severe fear of falling. two occasions of author intervention to prevent fall however mostly min assist +BUE support on RW   Balance Overall balance assessment: Needs assistance Sitting-balance support: Feet supported, Bilateral upper extremity supported Sitting balance-Leahy Scale: Fair     Standing balance support: Bilateral upper extremity supported, Reliant on assistive device for balance, During functional activity Standing balance-Leahy Scale: Poor Standing balance comment: pt has several occasions of posterior LOB       Cognition Arousal/Alertness: Awake/alert Behavior During Therapy: WFL for tasks assessed/performed Overall Cognitive Status: Within Functional Limits for tasks assessed    General Comments: Pt is A and O x 4. Gets anxious/fear of falling with mobility           General Comments General comments (skin integrity, edema, etc.): Pt unaware of BM. author assisted with hygiene care and bed linen change      Pertinent Vitals/Pain Pain Assessment Pain Assessment: 0-10 Pain Score: 4  Pain Location: neck/back Pain Descriptors / Indicators: Sore, Grimacing, Guarding Pain Intervention(s): Limited activity within patient's tolerance, Monitored during session, Premedicated before session, Repositioned     PT Goals (current goals can now be found in the care plan section) Acute Rehab PT Goals Patient Stated Goal: stop falling Progress towards PT goals: Progressing toward goals    Frequency    7X/week      PT Plan Current plan remains appropriate    Co-evaluation     PT goals addressed during session: Mobility/safety with mobility;Balance;Proper use of DME;Strengthening/ROM        AM-PAC PT "6 Clicks" Mobility   Outcome Measure  Help needed turning from your back to your side while in a flat bed without using bedrails?: A Lot Help needed moving from lying on your back to sitting on the side of a  flat bed without using bedrails?: A Lot Help needed moving to and from a bed to a chair (including a wheelchair)?: A Lot Help needed standing up from a chair using your arms (e.g., wheelchair or bedside chair)?: A Lot Help needed to walk in hospital room?: A Lot Help needed climbing 3-5 steps with a railing? : A Lot 6 Click Score: 12    End of Session Equipment Utilized During Treatment: Gait belt;Cervical collar Activity Tolerance: Patient tolerated treatment well;Patient limited by fatigue Patient left: in bed;with call bell/phone within reach;with bed alarm set Nurse Communication: Mobility status;Precautions;Weight bearing status PT Visit Diagnosis: Muscle weakness (generalized) (M62.81);Difficulty in walking, not elsewhere classified (R26.2);Unsteadiness on feet (R26.81);Repeated falls (R29.6)     Time: 1610-9604 PT Time Calculation (min) (ACUTE ONLY): 25 min  Charges:  $Gait Training: 8-22 mins $Therapeutic Activity: 8-22 mins                    Jetta Lout PTA 02/10/23, 10:31 AM

## 2023-02-10 NOTE — Plan of Care (Signed)
  Problem: Fluid Volume: Goal: Hemodynamic stability will improve Outcome: Progressing   Problem: Clinical Measurements: Goal: Diagnostic test results will improve Outcome: Progressing Goal: Signs and symptoms of infection will decrease Outcome: Progressing   Problem: Respiratory: Goal: Ability to maintain adequate ventilation will improve Outcome: Progressing   Problem: Education: Goal: Ability to describe self-care measures that may prevent or decrease complications (Diabetes Survival Skills Education) will improve Outcome: Progressing Goal: Individualized Educational Video(s) Outcome: Progressing   Problem: Cardiac: Goal: Ability to maintain an adequate cardiac output will improve Outcome: Progressing   Problem: Health Behavior/Discharge Planning: Goal: Ability to identify and utilize available resources and services will improve Outcome: Progressing Goal: Ability to manage health-related needs will improve Outcome: Progressing   Problem: Fluid Volume: Goal: Ability to achieve a balanced intake and output will improve Outcome: Progressing   Problem: Metabolic: Goal: Ability to maintain appropriate glucose levels will improve Outcome: Progressing   Problem: Nutritional: Goal: Maintenance of adequate nutrition will improve Outcome: Progressing Goal: Maintenance of adequate weight for body size and type will improve Outcome: Progressing   Problem: Respiratory: Goal: Will regain and/or maintain adequate ventilation Outcome: Progressing   Problem: Urinary Elimination: Goal: Ability to achieve and maintain adequate renal perfusion and functioning will improve Outcome: Progressing   Problem: Education: Goal: Ability to describe self-care measures that may prevent or decrease complications (Diabetes Survival Skills Education) will improve Outcome: Progressing Goal: Individualized Educational Video(s) Outcome: Progressing   Problem: Coping: Goal: Ability to adjust  to condition or change in health will improve Outcome: Progressing   Problem: Fluid Volume: Goal: Ability to maintain a balanced intake and output will improve Outcome: Progressing   Problem: Health Behavior/Discharge Planning: Goal: Ability to identify and utilize available resources and services will improve Outcome: Progressing Goal: Ability to manage health-related needs will improve Outcome: Progressing   Problem: Metabolic: Goal: Ability to maintain appropriate glucose levels will improve Outcome: Progressing   Problem: Nutritional: Goal: Maintenance of adequate nutrition will improve Outcome: Progressing Goal: Progress toward achieving an optimal weight will improve Outcome: Progressing   Problem: Skin Integrity: Goal: Risk for impaired skin integrity will decrease Outcome: Progressing   Problem: Tissue Perfusion: Goal: Adequacy of tissue perfusion will improve Outcome: Progressing   Problem: Education: Goal: Knowledge of General Education information will improve Description: Including pain rating scale, medication(s)/side effects and non-pharmacologic comfort measures Outcome: Progressing   Problem: Health Behavior/Discharge Planning: Goal: Ability to manage health-related needs will improve Outcome: Progressing   Problem: Clinical Measurements: Goal: Ability to maintain clinical measurements within normal limits will improve Outcome: Progressing Goal: Will remain free from infection Outcome: Progressing Goal: Diagnostic test results will improve Outcome: Progressing Goal: Respiratory complications will improve Outcome: Progressing Goal: Cardiovascular complication will be avoided Outcome: Progressing   Problem: Activity: Goal: Risk for activity intolerance will decrease Outcome: Progressing   Problem: Nutrition: Goal: Adequate nutrition will be maintained Outcome: Progressing   Problem: Coping: Goal: Level of anxiety will decrease Outcome:  Progressing   Problem: Elimination: Goal: Will not experience complications related to bowel motility Outcome: Progressing Goal: Will not experience complications related to urinary retention Outcome: Progressing   Problem: Pain Managment: Goal: General experience of comfort will improve Outcome: Progressing   Problem: Safety: Goal: Ability to remain free from injury will improve Outcome: Progressing   Problem: Skin Integrity: Goal: Risk for impaired skin integrity will decrease Outcome: Progressing

## 2023-02-10 NOTE — Progress Notes (Signed)
[facility] Progress Note  Patient: Gary [name]    ZOX:[id number]  DOA: 02/05/2023     Date of Service: the patient was seen and examined on 02/10/2023  Chief Complaint  Patient presents with   Fall   Brief hospital course: Gary [name] is a 77 y.o. male with medical history significant of  recurrent stage III mantle cell lymphoma s/p 43 cycles of Rituxan and Treanda, depression, insulin-dependent diabetes mellitus, hypertension, hyperlipidemia, who presents to ED with increase falls over the last week at [facility]. Patient of note has interim history of admission 01/12/2023-01/25/23 at which time patient was treated for  Sepsis due to cellulitis, as well as AKI. Patient at that time had presented to ED with generalized weakness and hypotension.  Patient also has interim history of colonoscopy on 02/04/23 with results noting polyp descending colon/hemorrhoids otherwise normal :Patient currently states he was attempting to go to the bathroom and his leg gave out on him and he was not able to use his arms to hold himself up due to being weak. Sister also aides in history she is concerned about current rehab has has had 3 falls since being admitted on 4/15. She is also concerned about his lack of appetite. She also note him picking at the air when he sleeps which is new for him and she is concerned about medication side-effect. Patient notes no fever/chills/, but does have nausea , no current abdominal pain or chest pain.   ED Course:  Afeb, bp 121/58, HR78, rr 18 sat 100%  UA : LE Moderate, bacteria rare CTH/Cervical spine IMPRESSION: 1. Distraction injury at C6-C7, with fracture through the right anterior superior C7 endplate and widening of the anterior aspect of the disc space at C6-C7. Prevertebral fluid anterior to C4-T2. 2. No acute intracranial process. 3. Small right pleural effusion.   Cxr: IMPRESSION: Chronic interstitial changes consistent with fibrosis. No evidence of active pulmonary  disease.   Wbc 2.4, hgb 9.7 at baseline,plt 110 Inr 1.4  NA 136, K 4.2, glu 155,  Xray pelvis; Osteopenia. No displaced fractures are visualized. If there is high clinical concern for hip fracture recommend dedicated hip views.  EKG ventricular paced  UA + wbc , LE, urine culture negative Tx tylenol, ultram    Assessment and Plan:  # Fracture of C7 endplate s/p Fall due to orthostatic hypotension Prevertebral fluid anterior to C4-T2 -case discussed with neurosurgery Dr Welton Flakes who rec cervical collar and out patient follow up  -supportive pain regimen  -Fall due to orthostatic hypotension, and low volume from chronic diarrhea  4/30 patient blood pressure dropped from sitting to standing position, positive for orthostatic vital signs, unable to stand by himself, 2 people assist, while taking orthostatics. Continue fall precautions, ambulate with assistance, turn patient every 2 hours Continue aspiration precautions -PT/OT evaluate done SLP eval done, started on dysphagia 2 diet with thin liquids     # Pyuria, UA positive, urine culture insufficient growth.  SNF was ceftriaxone, discontinued antibiotics on 4/29.     # AKI, baseline creatinine 1.03 on 01/1523 4/28 Creatinine 1.42--0.97 improving Monitor renal functions daily Bladder scan 94, ruled out urinary retention Continue IV fluid for gentle hydration    # Hypotension, persistent -requiring midodrine  # Recurrent stage III mantle cell lymphoma  -s/p 43 cycles of Rituxan and Treanda -Last chemo was in January 2024 -pancytopenia, due to malignancy, stable, no fever. -followed by oncology as outpatient Dr Orlie Dakin  4/29 texted to Dr. Orlie Dakin  for neutropenia, recommended no need of Zarzio at this time as patient is afebrile and no signs of infection.  # Folic acid deficiency, folate level 3.3, started folic acid 1 mg p.o. daily # Anemia most likely secondary to chronic disease and underlying malignancy. Iron profile and B12  level within normal range Monitor H&H and transfuse if hemoglobin less than 7  # Chronic diarrhea  -began with chemo  -followed by GI  -recent c-scope 4/25 noted only polyp and hemorrhoids  -possible cause of patient falls due to orthostasis  4/30 no BM for past 2 days as per RN, seems send diarrhea improving.  Discontinued cholestyramine.  # Depression, continue ssri    # Type 1 diabetes mellitus  Uncontrolled blood glucose could be due to lack of insulin use Diabetic coordinator consulted, Started Semglee 5 units daily and NovoLog sliding scale, monitor CBG Continue diabetic diet 4/28 CBGs low today, held [employer]   # Hyperlipidemia -continue statin   # Hypomagnesemia, mag repleted. Monitor electrolytes and replete as needed.   # Hypothyroid, patient was on Synthroid 50 mcg p.o. daily TSH 5.8, elevated, could be due to acute illness. Continue Synthroid 50 mcg daily, repeat TSH level after 4 to 6 weeks.   # Right lower extremity chronic wound and bilateral lower extremity pigmentation and dry skin Wound care RN consulted Started Lotrisone cream BID  Body mass index is 29.69 kg/m.  Interventions:   Pressure Injury 01/12/23 Buttocks Right;Left Stage 2 -  Partial thickness loss of dermis presenting as a shallow open injury with a red, pink wound bed without slough. (Active)  01/12/23 2330  Location: Buttocks  Location Orientation: Right;Left  Staging: Stage 2 -  Partial thickness loss of dermis presenting as a shallow open injury with a red, pink wound bed without slough.  Wound Description (Comments):   Present on Admission: Yes  Dressing Type Foam - Lift dressing to assess site every shift 02/10/23 0915     Pressure Injury 01/14/23 Coccyx Bilateral Deep Tissue Pressure Injury - Purple or maroon localized area of discolored intact skin or blood-filled blister due to damage of underlying soft tissue from pressure and/or shear. 4 DTI areas noted (Active)  01/14/23 1751   Location: Coccyx  Location Orientation: Bilateral  Staging: Deep Tissue Pressure Injury - Purple or maroon localized area of discolored intact skin or blood-filled blister due to damage of underlying soft tissue from pressure and/or shear.  Wound Description (Comments): 4 DTI areas noted  Present on Admission:   Dressing Type Foam - Lift dressing to assess site every shift 02/10/23 0915     Diet: Diabetic diet DVT Prophylaxis: Subcutaneous Heparin    Advance goals of care discussion: DNR  Family Communication: family was not present at bedside, at the time of interview.  The pt provided permission to discuss medical plan with the family. Opportunity was given to ask question and all questions were answered satisfactorily.  5/1 d/w patient's sister over the phone regarding discharge planning and recommended SNF placement   Disposition:  Pt is from [facility], admitted with fall and C7 Fx and uncontrolled DM, still has C-collar. CBG is well controlled now, but has neutropenia, which precludes a safe discharge. Discharge to SNF, clinically stable, medically optimized.  Awaiting for placement to SNF. TOC consulted for discharge planning  Subjective: No significant events overnight, patient was laying comfortably in the bed, eating breakfast.  Patient said that he is feeling fair, no any  active issues.  Physical Exam: General:  NAD, lying comfortably Appear in no distress, affect appropriate Eyes: PERRLA ENT: Oral Mucosa Clear, moist,  Neck: no JVD, cervical collar intact Cardiovascular: S1 and S2 Present, no Murmur,  Respiratory: good respiratory effort, Bilateral Air entry equal and Decreased, no Crackles, no wheezes Abdomen: Bowel Sound present, Soft and no tenderness,  Skin: no rashes Extremities: Mild edema, right lower extremity chronic wound, chronic pigmentation, dryness is improving with topical cream. Neurologic: without any new focal findings Gait not checked due to patient  safety concerns  Vitals:   02/10/23 0014 02/10/23 0339 02/10/23 0753 02/10/23 1137  BP: 118/62 (!) 110/46 123/62 119/64  Pulse: 99 92 84 94  Resp: 19 19 18 18   Temp: 98.6 F (37 C) 98 F (36.7 C) 98.4 F (36.9 C) 98 F (36.7 C)  TempSrc:      SpO2: 99% 98% 100% 100%  Weight:      Height:        Intake/Output Summary (Last 24 hours) at 02/10/2023 1521 Last data filed at 02/10/2023 1254 Gross per 24 hour  Intake 470 ml  Output 250 ml  Net 220 ml   Filed Weights   02/05/23 1905 02/06/23 1044  Weight: 96.6 kg 91.2 kg    Data Reviewed: I have personally reviewed and interpreted daily labs, tele strips, imagings as discussed above. I reviewed all nursing notes, pharmacy notes, vitals, pertinent old records I have discussed plan of care as described above with RN and patient/family.  CBC: Recent Labs  Lab 02/05/23 2053 02/06/23 0100 02/07/23 0614 02/08/23 0530 02/09/23 0615 02/10/23 0530  WBC 2.4* 1.9* 1.7* 1.4* 1.4* 1.6*  NEUTROABS 0.5*  --  0.5* 0.3* 0.4* 0.3*  HGB 9.7* 8.8* 8.3* 8.5* 8.3* 9.0*  HCT 31.3* 28.8* 26.7* 27.4* 27.1* 28.6*  MCV 94.8 96.0 95.0 96.1 97.1 96.3  PLT 110* 116* 112* 87* 88* 103*   Basic Metabolic Panel: Recent Labs  Lab 02/06/23 0108 02/07/23 0614 02/08/23 0530 02/09/23 0615 02/10/23 0530  NA 135 138 139 139 139  K 4.3 4.1 4.2 4.4 4.2  CL 98 104 106 107 109  CO2 25 28 26 25 24   GLUCOSE 234* 96 159* 194* 181*  BUN 14 17 14 12 12   CREATININE 1.16 1.42* 1.12 0.97 1.02  CALCIUM 7.4* 7.4* 7.4* 7.3* 7.4*  MG 1.4* 1.6* 1.8 1.7  --   PHOS 2.8 3.1 2.8 2.8  --     Studies: No results found.  Scheduled Meds:  calcium carbonate  1 tablet Oral BID WC   Chlorhexidine Gluconate Cloth  6 each Topical Daily   clotrimazole-betamethasone   Topical BID   ferrous sulfate  325 mg Oral Q breakfast   FLUoxetine  20 mg Oral Daily   folic acid  1 mg Oral Daily   heparin  5,000 Units Subcutaneous Q8H   insulin aspart  0-6 Units Subcutaneous Q4H    insulin glargine-yfgn  5 Units Subcutaneous Daily   lactobacillus  1 g Oral TID WC   levothyroxine  50 mcg Oral QAC breakfast   midodrine  10 mg Oral TID WC   pantoprazole  40 mg Oral BID AC   pravastatin  40 mg Oral QHS   Continuous Infusions:  sodium chloride 50 mL/hr at 02/09/23 1210   PRN Meds: acetaminophen, glucose, loperamide, morphine injection, ondansetron (ZOFRAN) IV  Time spent: 40 minutes  Author: Gillis Santa. MD [facility] 02/10/2023 3:21 PM  To reach On-call, see care teams to locate the attending and reach  out to them via [email or web address]. If 7PM-7AM, please contact night-coverage If you still have difficulty reaching the attending provider, please page the [facility] (Director on Call) for [facility] on amion for assistance.

## 2023-02-10 NOTE — Progress Notes (Signed)
Palliative Care Progress Note, Assessment & Plan   Patient Name: Gary [name]       Date: 02/10/2023 DOB: [birth year]  Age: 77 y.o. MRN#: [id number] Attending Physician: Gillis Santa, MD Primary Care Physician: Dale Sedgwick, MD Admit Date: 02/05/2023  Subjective: Patient is sitting up in bed in no apparent distress.  He acknowledges my presence and is able to make his wishes known.  He has no acute complaints at this time.  No family or friends present during my visit.  HPI: 77 y.o. male  with past medical history of recurrent stage III mantle cell lymphoma last chemo treatment 11/06/2022 with last PET scan 11/2022 showing significant interval response to therapy, DM1,CHF, HTN, HLD  admitted from [facility] on 02/05/2023 with frequent falls.   Unwitnessed fall 4/26 with head trauma with associated neck pain   CTH/Cervical spine IMPRESSION: 1. Distraction injury at C6-C7, with fracture through the right anterior superior C7 endplate and widening of the anterior aspect of the disc space at C6-C7. Prevertebral fluid anterior to C4-T2. 2. No acute intracranial process. 3. Small right pleural effusion.   Noted recent hospitalization 01/12/2023-01/25/2023 treated for sepsis secondary to LE cellulitis as well as AKI    Ongoing issue with diarrhea noted since January-not felt to be related to chemo treatments, continued on Questran and as needed Imodium  Has been seen by GI as outpatient Colonoscopy 02/04/2023-findings of polyps in ascending colon otherwise normal  Summary of counseling/coordination of care: After reviewing the patient's chart and assessing the patient at bedside, I spoke with patient in regards to plan and goals of care.    Symptoms assessed.  Patient endorses his diarrhea has  significantly improved.  He denies abdominal pain or discomfort at this time.    I again discussed boundaries of care.  Patient was again clear with his wishes and they aligned with the goals of care he made known in our previous discussions. I conveyed that I had made his wishes known to his sister, who was in agreement with the following. Therefore, I completed a MOST form today. The patient outlined his wishes for the following treatment decisions:  Cardiopulmonary Resuscitation: Do Not Attempt Resuscitation (DNR/No CPR)  Medical Interventions: Full Scope of Treatment: Use intubation, advanced airway interventions, mechanical ventilation, cardioversion as indicated, medical treatment, IV fluids, etc, also provide comfort measures. Transfer to the hospital if indicated  Antibiotics: Antibiotics if indicated  IV Fluids: IV fluids for a defined trial period  Feeding Tube: Feeding tube for a defined trial period    In the event of a cardiopulmonary arrest, patient would not want resuscitative measures.  He would want to allow a natural passing.  DNR placed in electronic medical record and goldenrod paper DNR form completed and placed in paper chart.  Dayshift RN and attending Dr. Lucianne Muss made aware of CODE STATUS change.  [facility]enrod form and MOST form emailed to be placed in ACP tab of epic.  Copies of DNR MOST left at bedside for patient's review.  Goals are clear.  Disposition pending and TOC is following closely.  PMT contact information given to patient again.  Patient's sister also has PMT contact info.  Patient and family  were encouraged to reach out with any acute palliative needs during this hospitalization.  PMT will continue to follow and shadow the patient's chart peripherally.  Please reengage with PMT if goals change, at patient/family's request, or if patient's health deteriorates during hospitalization.  Physical Exam Vitals reviewed.  Constitutional:      General: He is not in  acute distress.    Appearance: He is obese. He is not ill-appearing.  HENT:     Head: Normocephalic.     Nose: Nose normal.     Mouth/Throat:     Mouth: Mucous membranes are moist.  Eyes:     Pupils: Pupils are equal, round, and reactive to light.  Cardiovascular:     Rate and Rhythm: Normal rate.     Pulses: Normal pulses.  Pulmonary:     Effort: Pulmonary effort is normal.  Abdominal:     Palpations: Abdomen is soft.  Neurological:     Mental Status: He is alert and oriented to person, place, and time.  Psychiatric:        Mood and Affect: Mood normal.        Thought Content: Thought content normal.        Judgment: Judgment normal.             Total Time 50 minutes   Karmine Kauer L. Manon Hilding, FNP-BC Palliative Medicine Team Team Phone # [phone]

## 2023-02-11 DIAGNOSIS — W19XXXD Unspecified fall, subsequent encounter: Secondary | ICD-10-CM | POA: Diagnosis not present

## 2023-02-11 DIAGNOSIS — S12601A Unspecified nondisplaced fracture of seventh cervical vertebra, initial encounter for closed fracture: Secondary | ICD-10-CM | POA: Diagnosis not present

## 2023-02-11 LAB — CBC WITH DIFFERENTIAL/PLATELET
Abs Immature Granulocytes: 0.02 10*3/uL (ref 0.00–0.07)
Basophils Absolute: 0 10*3/uL (ref 0.0–0.1)
Basophils Relative: 1 %
Eosinophils Absolute: 0.1 10*3/uL (ref 0.0–0.5)
Eosinophils Relative: 9 %
HCT: 27.3 % — ABNORMAL LOW (ref 39.0–52.0)
Hemoglobin: 8.4 g/dL — ABNORMAL LOW (ref 13.0–17.0)
Immature Granulocytes: 1 %
Lymphocytes Relative: 39 %
Lymphs Abs: 0.6 10*3/uL — ABNORMAL LOW (ref 0.7–4.0)
MCH: 29.8 pg (ref 26.0–34.0)
MCHC: 30.8 g/dL (ref 30.0–36.0)
MCV: 96.8 fL (ref 80.0–100.0)
Monocytes Absolute: 0.4 10*3/uL (ref 0.1–1.0)
Monocytes Relative: 25 %
Neutro Abs: 0.4 10*3/uL — CL (ref 1.7–7.7)
Neutrophils Relative %: 25 %
Platelets: 102 10*3/uL — ABNORMAL LOW (ref 150–400)
RBC: 2.82 MIL/uL — ABNORMAL LOW (ref 4.22–5.81)
RDW: 15.5 % (ref 11.5–15.5)
Smear Review: NORMAL
WBC: 1.5 10*3/uL — ABNORMAL LOW (ref 4.0–10.5)
nRBC: 0 % (ref 0.0–0.2)

## 2023-02-11 LAB — BASIC METABOLIC PANEL
Anion gap: 6 (ref 5–15)
BUN: 9 mg/dL (ref 8–23)
CO2: 25 mmol/L (ref 22–32)
Calcium: 7.6 mg/dL — ABNORMAL LOW (ref 8.9–10.3)
Chloride: 109 mmol/L (ref 98–111)
Creatinine, Ser: 0.87 mg/dL (ref 0.61–1.24)
GFR, Estimated: >60 mL/min (ref >60)
Glucose, Bld: 122 mg/dL — ABNORMAL HIGH (ref 70–99)
Potassium: 4.1 mmol/L (ref 3.5–5.1)
Sodium: 140 mmol/L (ref 135–145)

## 2023-02-11 LAB — GLUCOSE, CAPILLARY
Glucose-Capillary: 109 mg/dL — ABNORMAL HIGH (ref 70–99)
Glucose-Capillary: 117 mg/dL — ABNORMAL HIGH (ref 70–99)
Glucose-Capillary: 120 mg/dL — ABNORMAL HIGH (ref 70–99)
Glucose-Capillary: 124 mg/dL — ABNORMAL HIGH (ref 70–99)
Glucose-Capillary: 150 mg/dL — ABNORMAL HIGH (ref 70–99)
Glucose-Capillary: 156 mg/dL — ABNORMAL HIGH (ref 70–99)
Glucose-Capillary: 161 mg/dL — ABNORMAL HIGH (ref 70–99)

## 2023-02-11 NOTE — TOC Progression Note (Signed)
Transition of Care [facility]) - Progression Note    Patient Details  Name: Gary [name] MRN: [id number] Date of Birth: 09/18/1946  Transition of Care [facility]) CM/SW Contact  Garret Reddish, RN Phone Number: 02/11/2023, 10:50 AM  Clinical Narrative:   I have spoken with patient's sister on yesterday.  [name] reports that she would like for me to look at [facility].  I have explained to [name] that I do not feel that patient would meet the criteria for Inpatient rehab as he would have to be able to tolerate intensive therapy.  She request that I still fax to [facility].  She also request that I fax to [facility] in [place] to see they can offer a bed.  I have reviewed bed offers with [name] and at this time she is not interested in none of the bed offers. She is very concerned about the care her brother received in the previous SNF.    Referral information sent to [facility] per sisters request and [facility] in [place].      Expected Discharge Plan: Skilled Nursing Facility Barriers to Discharge: Continued Medical Work up  Expected Discharge Plan and Services In-house Referral: Clinical Social Work     Living arrangements for the past 2 months: Single Family Home                                       Social Determinants of Health (SDOH) Interventions SDOH Screenings   Food Insecurity: No Food Insecurity (01/12/2023)  Housing: Low Risk  (01/12/2023)  Transportation Needs: No Transportation Needs (01/12/2023)  Recent Concern: Transportation Needs - Unmet Transportation Needs (01/11/2023)  Utilities: Not At Risk (01/12/2023)  Alcohol Screen: Low Risk  (11/27/2020)  Depression (PHQ2-9): High Risk (01/12/2023)  Financial Resource Strain: Low Risk  (03/10/2022)  Physical Activity: Inactive (03/10/2022)  Social Connections: Socially Isolated (03/10/2022)  Stress: No Stress Concern Present (03/10/2022)  Tobacco Use: High Risk (02/05/2023)     Readmission Risk Interventions    01/15/2023   11:37 AM 08/30/2021   10:48 AM  Readmission Risk Prevention Plan  Transportation Screening Complete Complete  PCP or Specialist Appt within 3-5 Days Complete Complete  HRI or Home Care Consult Complete Complete  Social Work Consult for Recovery Care Planning/Counseling Complete Complete  Palliative Care Screening Not Applicable Not Applicable  Medication Review Oceanographer) Complete Complete

## 2023-02-11 NOTE — Progress Notes (Signed)
Physical Therapy Treatment Patient Details Name: Gary [name] MRN: [id number] DOB: 04-Apr-1946 Today's Date: 02/11/2023   History of Present Illness Gary [name] is a 77 y.o. male with medical history significant of   recurrent stage III mantle cell lymphoma s/p 43 cycles of Rituxan and Treanda, depression, insulin-dependent diabetes mellitus, hypertension, hyperlipidemia, who presents to ED with increase falls over the last week at [facility] nursing home. Pt had been discharged to [facility]) after hospital admission in mid April 2024. Prior to recent hospitalization he was living at home alone. He has experienced multiple falls over last month. Pt also reports increased difficulty swallowing over last month. Cervical X-ray reveals Distraction injury at C6-C7, with fracture through the right  anterior superior C7 endplate and widening of the anterior aspect of  the disc space at C6-C7. Prevertebral fluid anterior to C4-T2. Neurosurgery Dr Welton Flakes recommends cervical collar and out patient follow up.    PT Comments    Pt was sitting in recliner upon arrival. He voiced frustration with response time to call bell however RN staff stated they could not hear what pt was saying when called. He was agreeable to session and remains motivated. Requested to apply shoes prior to standing. Author had to assist due to pt being unable to perform I'ly. He was able to stand and tolerate ambulation ~ 30 ft however has several occasions of LOB with intervention to prevent falling. He tends to have posterior LOB. Highly recommend +2 assistance for RN staff with any gait or OOB activity. DC recs remain appropriate. Will continue to follow and progress as able per current POC.    Recommendations for follow up therapy are one component of a multi-disciplinary discharge planning process, led by the attending physician.  Recommendations may be updated based on patient status, additional functional criteria and  insurance authorization.     Assistance Recommended at Discharge Frequent or constant Supervision/Assistance  Patient can return home with the following A lot of help with bathing/dressing/bathroom;Assistance with cooking/housework;Assist for transportation;Help with stairs or ramp for entrance;Direct supervision/assist for medications management;Direct supervision/assist for financial management;A lot of help with walking and/or transfers   Equipment Recommendations  Other (comment) (Defer to next level of care)       Precautions / Restrictions Precautions Precautions: Fall;Cervical Precaution Comments: Cervical collar; C7 fracture Restrictions Weight Bearing Restrictions: No     Mobility  Bed Mobility Overal bed mobility: Needs Assistance Bed Mobility: Sit to Supine  Sit to supine: Max assist Sit to sidelying: Max assist   Transfers Overall transfer level: Needs assistance Equipment used: Rolling walker (2 wheels) Transfers: Sit to/from Stand Sit to Stand: Mod assist   General transfer comment: mod assist to stand from recliner with vcs for fwd wt shift and handplacement. Pt is anxious still with all mobility    Ambulation/Gait Ambulation/Gait assistance: Min assist, Mod assist Gait Distance (Feet): 30 Feet Assistive device: Rolling walker (2 wheels) Gait Pattern/deviations: Step-to pattern, Narrow base of support, Trunk flexed Gait velocity: decreased  General Gait Details: Pt continues to present sevrely unsteady. He has several occasions of posterior LOB with intervention to prevent fall. Gait distance is limited by fatigue. Pt remains high fall risk   Balance Overall balance assessment: Needs assistance Sitting-balance support: Feet supported, Bilateral upper extremity supported Sitting balance-Leahy Scale: Fair Sitting balance - Comments: struggles to slide hips to edge of chair even in sitting   Standing balance support: Bilateral upper extremity supported, During  functional activity, Reliant  on assistive device for balance Standing balance-Leahy Scale: Poor Standing balance comment: pt is extremely high fall risk. tends to lose balance posteriorly     Cognition Arousal/Alertness: Awake/alert Behavior During Therapy: WFL for tasks assessed/performed Overall Cognitive Status: Within Functional Limits for tasks assessed    General Comments: Pt is A and O x 4. Gets anxious/fear of falling with mobility               Pertinent Vitals/Pain Pain Assessment Pain Assessment: No/denies pain Pain Location: leg cramps off/on Pain Descriptors / Indicators: Sore, Grimacing, Guarding Pain Intervention(s): Limited activity within patient's tolerance, Monitored during session, Repositioned     PT Goals (current goals can now be found in the care plan section) Acute Rehab PT Goals Patient Stated Goal: I want to be able to walk without falling Progress towards PT goals: Progressing toward goals    Frequency    7X/week      PT Plan Current plan remains appropriate    Co-evaluation     PT goals addressed during session: Mobility/safety with mobility;Balance;Proper use of DME;Strengthening/ROM        AM-PAC PT "6 Clicks" Mobility   Outcome Measure  Help needed turning from your back to your side while in a flat bed without using bedrails?: A Lot Help needed moving from lying on your back to sitting on the side of a flat bed without using bedrails?: A Lot Help needed moving to and from a bed to a chair (including a wheelchair)?: A Lot Help needed standing up from a chair using your arms (e.g., wheelchair or bedside chair)?: A Lot Help needed to walk in hospital room?: A Lot Help needed climbing 3-5 steps with a railing? : A Lot 6 Click Score: 12    End of Session Equipment Utilized During Treatment: Gait belt;Cervical collar Activity Tolerance: Patient tolerated treatment well;Patient limited by fatigue Patient left: in bed;with call  bell/phone within reach;with bed alarm set Nurse Communication: Mobility status;Precautions;Weight bearing status PT Visit Diagnosis: Muscle weakness (generalized) (M62.81);Difficulty in walking, not elsewhere classified (R26.2);Unsteadiness on feet (R26.81);Repeated falls (R29.6)     Time: 1354-1410 PT Time Calculation (min) (ACUTE ONLY): 16 min  Charges:  $Gait Training: 8-22 mins                     Jetta Lout PTA 02/11/23, 2:25 PM

## 2023-02-11 NOTE — Progress Notes (Signed)
Speech Language Pathology Treatment: Dysphagia  Patient Details Name: Gary [name] MRN: [id number] DOB: 11/29/1945 Today's Date: 02/11/2023 Time: 1150-1200 SLP Time Calculation (min) (ACUTE ONLY): 10 min  Assessment / Plan / Recommendation Clinical Impression  Pt seen for diet tolerance. Pt alert and cooperative. Imprecise articulation continues. Pt with [place] J collar donned for duration of tx.    Pt given trials of simulated ground solids (cracker crumbled in pudding) and thin liquids (via straw). Oral dysphagia persists and is c/b mildly prolonged mastication and trace lingual residual which was likely due to dental status and deconditioned state as well as limited mandibular excursion with [place] J collar donned. Pt with indep use of liquid wash to facilitate oral clearance. Pharyngeally, no overt s/sx pharyngeal dysphagia appreciated. No change to vocal quality. Pt able to feed self with intermittent assistance due to reduced UE ROM.   Pt with subjective report of preferring the ground solids for ease of mastication.    Recommend continuation of Dysphagia 2 Diet with Thin Liquids with safe swallowing strategies/aspiration precautions/ reflux precautions as outlined below.    SLP to s/o as pt has no acute SLP needs at this time.    Pt made aware of results, recommendations, and SLP POC. Pt verbalized understanding/agreement. Reinforcement of content may be needed.    HPI HPI: Pt is a 77 y.o. male with medical history significant of  recurrent stage III mantle cell lymphoma s/p 43 cycles of Rituxan and Treanda, depression, insulin-dependent diabetes mellitus, hypertension, hyperlipidemia, who presents to ED with increase falls over the last week at [facility] nursing home. Pt had been discharged to [facility]) after hospital admission in mid April 2024. Prior to recent hospitalization, he was living at home alone. He had experienced multiple falls over last month. Pt also reports  decreased oral intake and difficulty swallowing over last 1-2 months(see previous ST BSE last admit indicating Esophageal phase Dysmtolity and Regurgitation).  OF NOTE: Cervical X-ray reveals Distraction injury at C6-C7, with fracture through the right  anterior superior C7 endplate and widening of the anterior aspect of  the disc space at C6-C7. Prevertebral fluid anterior to C4-T2. Neurosurgery Dr Welton Flakes recommends cervical collar and out patient follow up.   CXR at admit: Chronic interstitial changes consistent with fibrosis. No evidence  of active pulmonary disease.      SLP Plan  All goals met      Recommendations for follow up therapy are one component of a multi-disciplinary discharge planning process, led by the attending physician.  Recommendations may be updated based on patient status, additional functional criteria and insurance authorization.    Recommendations  Diet recommendations: Dysphagia 2 (fine chop);Thin liquid Liquids provided via: Cup;Straw;Teaspoon Medication Administration: Whole meds with puree (vs crushed) Supervision: Patient able to self feed;Staff to assist with self feeding (allow pt to feed self as much as possible) Compensations: Slow rate;Small sips/bites Postural Changes and/or Swallow Maneuvers: Seated upright 90 degrees;Upright 30-60 min after meal                  Oral care BID;Oral care before and after PO;Staff/trained caregiver to provide oral care     Dysphagia, oral phase (R13.11)     All goals met    [name], M.S., CCC-SLP Speech-Language Pathologist [facility] [phone] (ASCOM)  Gary [name]  02/11/2023, 12:22 PM

## 2023-02-11 NOTE — Progress Notes (Signed)
Occupational Therapy Treatment Patient Details Name: Gary [name] MRN: [id number] DOB: [birth year] Today's Date: 02/11/2023   History of present illness Gary [name] is a 77 y.o. male with medical history significant of   recurrent stage III mantle cell lymphoma s/p 43 cycles of Rituxan and Treanda, depression, insulin-dependent diabetes mellitus, hypertension, hyperlipidemia, who presents to ED with increase falls over the last week at [facility] nursing home. Pt had been discharged to [facility]) after hospital admission in mid April 2024. Prior to recent hospitalization he was living at home alone. He has experienced multiple falls over last month. Pt also reports increased difficulty swallowing over last month. Cervical X-ray reveals Distraction injury at C6-C7, with fracture through the right  anterior superior C7 endplate and widening of the anterior aspect of  the disc space at C6-C7. Prevertebral fluid anterior to C4-T2. Neurosurgery Dr Welton Flakes recommends cervical collar and out patient follow up.   OT comments  Pt seen for OT tx this date. Pt required MOD-MAX A for bed mobility and set up + supv for seated EOB grooming tasks. Pt fatigued quickly, endorsing 6.5/10 perceived rate of exertion with bed mobility and grooming tasks. Pt required MAX A for return to bed and TOTAL A +2 for boosting up in bed. Pt motivated throughout session, endorses fear of falling. Pt continues to benefit from skilled OT services to maximize return to PLOF and minimize caregiver burden.    Recommendations for follow up therapy are one component of a multi-disciplinary discharge planning process, led by the attending physician.  Recommendations may be updated based on patient status, additional functional criteria and insurance authorization.    Assistance Recommended at Discharge Frequent or constant Supervision/Assistance  Patient can return home with the following  Two people to help with walking and/or  transfers;Assistance with cooking/housework;Direct supervision/assist for medications management;Direct supervision/assist for financial management;Assist for transportation;Help with stairs or ramp for entrance;A lot of help with bathing/dressing/bathroom   Equipment Recommendations  Other (comment) (defer to next venue)    Recommendations for Other Services      Precautions / Restrictions Precautions Precautions: Fall;Cervical Precaution Comments: Cervical collar; C7 fracture Restrictions Weight Bearing Restrictions: No       Mobility Bed Mobility Overal bed mobility: Needs Assistance Bed Mobility: Supine to Sit, Rolling, Sit to Sidelying Rolling: Min assist, Mod assist   Supine to sit: Mod assist   Sit to sidelying: Max assist General bed mobility comments: +time/effort and physical assist for log roll and heavy BUE support with bed rail    Transfers                   General transfer comment: Pt attempted but unable to clear buttocks from bed despite elevated bed and MAX A     Balance Overall balance assessment: Needs assistance Sitting-balance support: No upper extremity supported, Feet supported Sitting balance-Leahy Scale: Fair     Standing balance support: Bilateral upper extremity supported, During functional activity Standing balance-Leahy Scale: Poor                             ADL either performed or assessed with clinical judgement   ADL Overall ADL's : Needs assistance/impaired     Grooming: Sitting;Supervision/safety;Set up;Oral care;Wash/dry face Grooming Details (indicate cue type and reason): Pt completed groomings tasks with set up of items on tray table and supervision, with fair static sitting balance for tasks, noting 6.5/10 PRE afterwards.  Extremity/Trunk Assessment              Vision       Perception     Praxis      Cognition Arousal/Alertness:  Awake/alert Behavior During Therapy: WFL for tasks assessed/performed Overall Cognitive Status: Within Functional Limits for tasks assessed                                 General Comments: anxious, fear of falling        Exercises      Shoulder Instructions       General Comments      Pertinent Vitals/ Pain       Pain Assessment Pain Assessment: Faces Faces Pain Scale: Hurts a little bit Pain Location: back Pain Descriptors / Indicators: Sore, Grimacing, Guarding Pain Intervention(s): Limited activity within patient's tolerance, Monitored during session, Repositioned  Home Living                                          Prior Functioning/Environment              Frequency  Min 1X/week        Progress Toward Goals  OT Goals(current goals can now be found in the care plan section)  Progress towards OT goals: Progressing toward goals  Acute Rehab OT Goals Patient Stated Goal: to feel better OT Goal Formulation: With patient Time For Goal Achievement: 02/20/23 Potential to Achieve Goals: Fair  Plan Discharge plan remains appropriate;Frequency remains appropriate    Co-evaluation        PT goals addressed during session: Mobility/safety with mobility;Balance;Proper use of DME;Strengthening/ROM        AM-PAC OT "6 Clicks" Daily Activity     Outcome Measure   Help from another person eating meals?: A Little Help from another person taking care of personal grooming?: A Little Help from another person toileting, which includes using toliet, bedpan, or urinal?: A Lot Help from another person bathing (including washing, rinsing, drying)?: A Lot Help from another person to put on and taking off regular upper body clothing?: A Little Help from another person to put on and taking off regular lower body clothing?: A Lot 6 Click Score: 15    End of Session    OT Visit Diagnosis: Unsteadiness on feet (R26.81);Repeated falls  (R29.6)   Activity Tolerance Patient tolerated treatment well   Patient Left in bed;with call bell/phone within reach;with bed alarm set   Nurse Communication Mobility status        Time: 5409-8119 OT Time Calculation (min): 30 min  Charges: OT General Charges $OT Visit: 1 Visit OT Treatments $Self Care/Home Management : 23-37 mins  Arman Filter., MPH, MS, OTR/L ascom [phone] 02/11/23, 3:33 PM

## 2023-02-11 NOTE — Progress Notes (Signed)
[facility] Progress Note  Patient: Gary [name]    ZOX:[id number]  DOA: 02/05/2023     Date of Service: the patient was seen and examined on 02/11/2023  Chief Complaint  Patient presents with   Fall   Brief hospital course: Gary [name] is a 76 y.o. male with medical history significant of  recurrent stage III mantle cell lymphoma s/p 43 cycles of Rituxan and Treanda, depression, insulin-dependent diabetes mellitus, hypertension, hyperlipidemia, who presents to ED with increase falls over the last week at [facility]. Patient of note has interim history of admission 01/12/2023-01/25/23 at which time patient was treated for  Sepsis due to cellulitis, as well as AKI. Patient at that time had presented to ED with generalized weakness and hypotension.  Patient also has interim history of colonoscopy on 02/04/23 with results noting polyp descending colon/hemorrhoids otherwise normal :Patient currently states he was attempting to go to the bathroom and his leg gave out on him and he was not able to use his arms to hold himself up due to being weak. Sister also aides in history she is concerned about current rehab has has had 3 falls since being admitted on 4/15. She is also concerned about his lack of appetite. She also note him picking at the air when he sleeps which is new for him and she is concerned about medication side-effect. Patient notes no fever/chills/, but does have nausea , no current abdominal pain or chest pain.   ED Course:  Afeb, bp 121/58, HR78, rr 18 sat 100%  UA : LE Moderate, bacteria rare CTH/Cervical spine IMPRESSION: 1. Distraction injury at C6-C7, with fracture through the right anterior superior C7 endplate and widening of the anterior aspect of the disc space at C6-C7. Prevertebral fluid anterior to C4-T2. 2. No acute intracranial process. 3. Small right pleural effusion.   Cxr: IMPRESSION: Chronic interstitial changes consistent with fibrosis. No evidence of active pulmonary  disease.   Wbc 2.4, hgb 9.7 at baseline,plt 110 Inr 1.4  NA 136, K 4.2, glu 155,  Xray pelvis; Osteopenia. No displaced fractures are visualized. If there is high clinical concern for hip fracture recommend dedicated hip views.  EKG ventricular paced  UA + wbc , LE, urine culture negative Tx tylenol, ultram    Assessment and Plan:  # Fracture of C7 endplate s/p Fall due to orthostatic hypotension Prevertebral fluid anterior to C4-T2 -case discussed with neurosurgery Dr Welton Flakes who rec cervical collar and out patient follow up  -supportive pain regimen  -Fall due to orthostatic hypotension, and low volume from chronic diarrhea  4/30 patient blood pressure dropped from sitting to standing position, positive for orthostatic vital signs, unable to stand by himself, 2 people assist, while taking orthostatics. Continue fall precautions, ambulate with assistance, turn patient every 2 hours Continue aspiration precautions -PT/OT evaluate done SLP eval done, started on dysphagia 2 diet with thin liquids     # Pyuria, UA positive, urine culture insufficient growth.  SNF was ceftriaxone, discontinued antibiotics on 4/29.     # AKI, baseline creatinine 1.03 on 01/25/23 4/28 Creatinine 1.42--0.87 improving Monitor renal functions daily Bladder scan 94, ruled out urinary retention Continue IV fluid for gentle hydration    # Chronic persistent hypotension, and orthostatic hypotension causing multiple falls Continued midodrine home dose Patient was advised to move slowly, continue fall precautions.   # Recurrent stage III mantle cell lymphoma  -s/p 43 cycles of Rituxan and Treanda -Last chemo was in January 2024 -pancytopenia,  due to malignancy, stable, no fever. -followed by oncology as outpatient Dr Orlie Dakin  4/29 texted to Dr. Orlie Dakin for neutropenia, recommended no need of Zarzio at this time as patient is afebrile and no signs of infection.  # Folic acid deficiency, folate level 3.3,  started folic acid 1 mg p.o. daily # Anemia most likely secondary to chronic disease and underlying malignancy. Iron profile and B12 level within normal range Monitor H&H and transfuse if hemoglobin less than 7  # Chronic diarrhea  -began with chemo  -followed by GI  -recent c-scope 4/25 noted only polyp and hemorrhoids  -possible cause of patient falls due to orthostasis  4/30 no BM for past 2 days as per RN, seems send diarrhea improving.  Discontinued cholestyramine. Last BM on 5/1  # Depression, continue ssri    # Type 1 diabetes mellitus  Uncontrolled blood glucose could be due to lack of insulin use Diabetic coordinator consulted, Started Semglee 5 units daily and NovoLog sliding scale, monitor CBG Continue diabetic diet 4/28 CBGs low today, held [employer]   # Hyperlipidemia -continue statin   # Hypomagnesemia, mag repleted. Monitor electrolytes and replete as needed.   # Hypothyroid, patient was on Synthroid 50 mcg p.o. daily TSH 5.8, elevated, could be due to acute illness. Continue Synthroid 50 mcg daily, repeat TSH level after 4 to 6 weeks.   # Right lower extremity chronic wound and bilateral lower extremity pigmentation and dry skin Wound care RN consulted Started Lotrisone cream BID  Body mass index is 29.69 kg/m.  Interventions:   Pressure Injury 01/12/23 Buttocks Right;Left Stage 2 -  Partial thickness loss of dermis presenting as a shallow open injury with a red, pink wound bed without slough. (Active)  01/12/23 2330  Location: Buttocks  Location Orientation: Right;Left  Staging: Stage 2 -  Partial thickness loss of dermis presenting as a shallow open injury with a red, pink wound bed without slough.  Wound Description (Comments):   Present on Admission: Yes  Dressing Type Foam - Lift dressing to assess site every shift 02/11/23 0800     Pressure Injury 01/14/23 Coccyx Bilateral Deep Tissue Pressure Injury - Purple or maroon localized area of  discolored intact skin or blood-filled blister due to damage of underlying soft tissue from pressure and/or shear. 4 DTI areas noted (Active)  01/14/23 1751  Location: Coccyx  Location Orientation: Bilateral  Staging: Deep Tissue Pressure Injury - Purple or maroon localized area of discolored intact skin or blood-filled blister due to damage of underlying soft tissue from pressure and/or shear.  Wound Description (Comments): 4 DTI areas noted  Present on Admission:   Dressing Type Foam - Lift dressing to assess site every shift 02/11/23 0800     Diet: Diabetic diet DVT Prophylaxis: Subcutaneous Heparin    Advance goals of care discussion: DNR  Family Communication: family was not present at bedside, at the time of interview.  The pt provided permission to discuss medical plan with the family. Opportunity was given to ask question and all questions were answered satisfactorily.  5/1 d/w patient's sister over the phone regarding discharge planning and recommended SNF placement   Disposition:  Pt is from [facility], admitted with fall and C7 Fx and uncontrolled DM, still has C-collar. CBG is well controlled now, but has neutropenia, which precludes a safe discharge. Discharge to SNF, clinically stable, medically optimized.  Awaiting for placement to SNF. TOC consulted for discharge planning  Subjective: No significant events overnight, patient was laying  comfortably in the bed stated that he is feeling little bit tired, no pain.  Denied any chest pain or palpitation, no shortness of breath.  No any other active issues.  Physical Exam: General: NAD, lying comfortably Appear in no distress, affect appropriate Eyes: PERRLA ENT: Oral Mucosa Clear, moist,  Neck: no JVD, cervical collar intact Cardiovascular: S1 and S2 Present, no Murmur,  Respiratory: good respiratory effort, Bilateral Air entry equal and Decreased, no Crackles, no wheezes Abdomen: Bowel Sound present, Soft and no tenderness,   Skin: no rashes Extremities: Mild edema, right lower extremity chronic wound, chronic pigmentation, dryness is improving with topical cream. Neurologic: without any new focal findings Gait not checked due to patient safety concerns  Vitals:   02/11/23 0002 02/11/23 0422 02/11/23 0735 02/11/23 1156  BP: 122/66 (!) 127/58 (!) 131/58 127/70  Pulse: 86 89 88 99  Resp: 18 18 18 18   Temp: 98.2 F (36.8 C) 98.1 F (36.7 C) 97.7 F (36.5 C) 97.7 F (36.5 C)  TempSrc:      SpO2: 100% 100% 100% 99%  Weight:      Height:        Intake/Output Summary (Last 24 hours) at 02/11/2023 1440 Last data filed at 02/11/2023 1424 Gross per 24 hour  Intake 340 ml  Output --  Net 340 ml   Filed Weights   02/05/23 1905 02/06/23 1044  Weight: 96.6 kg 91.2 kg    Data Reviewed: I have personally reviewed and interpreted daily labs, tele strips, imagings as discussed above. I reviewed all nursing notes, pharmacy notes, vitals, pertinent old records I have discussed plan of care as described above with RN and patient/family.  CBC: Recent Labs  Lab 02/07/23 0614 02/08/23 0530 02/09/23 0615 02/10/23 0530 02/11/23 0600  WBC 1.7* 1.4* 1.4* 1.6* 1.5*  NEUTROABS 0.5* 0.3* 0.4* 0.3* 0.4*  HGB 8.3* 8.5* 8.3* 9.0* 8.4*  HCT 26.7* 27.4* 27.1* 28.6* 27.3*  MCV 95.0 96.1 97.1 96.3 96.8  PLT 112* 87* 88* 103* 102*   Basic Metabolic Panel: Recent Labs  Lab 02/06/23 0108 02/07/23 0614 02/08/23 0530 02/09/23 0615 02/10/23 0530 02/11/23 0600  NA 135 138 139 139 139 140  K 4.3 4.1 4.2 4.4 4.2 4.1  CL 98 104 106 107 109 109  CO2 25 28 26 25 24 25   GLUCOSE 234* 96 159* 194* 181* 122*  BUN 14 17 14 12 12 9   CREATININE 1.16 1.42* 1.12 0.97 1.02 0.87  CALCIUM 7.4* 7.4* 7.4* 7.3* 7.4* 7.6*  MG 1.4* 1.6* 1.8 1.7  --   --   PHOS 2.8 3.1 2.8 2.8  --   --     Studies: No results found.  Scheduled Meds:  calcium carbonate  1 tablet Oral BID WC   Chlorhexidine Gluconate Cloth  6 each Topical Daily    clotrimazole-betamethasone   Topical BID   feeding supplement (GLUCERNA SHAKE)  237 mL Oral TID BM   ferrous sulfate  325 mg Oral Q breakfast   FLUoxetine  20 mg Oral Daily   folic acid  1 mg Oral Daily   heparin  5,000 Units Subcutaneous Q8H   insulin aspart  0-6 Units Subcutaneous Q4H   insulin glargine-yfgn  5 Units Subcutaneous Daily   lactobacillus  1 g Oral TID WC   levothyroxine  50 mcg Oral QAC breakfast   midodrine  10 mg Oral TID WC   pantoprazole  40 mg Oral BID AC   pravastatin  40 mg Oral  QHS   Continuous Infusions:  sodium chloride 50 mL/hr at 02/09/23 1210   PRN Meds: acetaminophen, glucose, loperamide, morphine injection, ondansetron (ZOFRAN) IV  Time spent: 35 minutes  Author: Gillis [name]. MD [facility] 02/11/2023 2:40 PM  To reach On-call, see care teams to locate the attending and reach out to them via [email or web address]. If 7PM-7AM, please contact night-coverage If you still have difficulty reaching the attending provider, please page the [facility] (Director on Call) for [facility] on amion for assistance.

## 2023-02-12 DIAGNOSIS — W19XXXD Unspecified fall, subsequent encounter: Secondary | ICD-10-CM | POA: Diagnosis not present

## 2023-02-12 DIAGNOSIS — R296 Repeated falls: Secondary | ICD-10-CM

## 2023-02-12 DIAGNOSIS — S0101XA Laceration without foreign body of scalp, initial encounter: Secondary | ICD-10-CM

## 2023-02-12 DIAGNOSIS — S12691A Other nondisplaced fracture of seventh cervical vertebra, initial encounter for closed fracture: Secondary | ICD-10-CM | POA: Diagnosis not present

## 2023-02-12 DIAGNOSIS — S12601A Unspecified nondisplaced fracture of seventh cervical vertebra, initial encounter for closed fracture: Secondary | ICD-10-CM

## 2023-02-12 LAB — GLUCOSE, CAPILLARY
Glucose-Capillary: 136 mg/dL — ABNORMAL HIGH (ref 70–99)
Glucose-Capillary: 133 mg/dL — ABNORMAL HIGH (ref 70–99)
Glucose-Capillary: 145 mg/dL — ABNORMAL HIGH (ref 70–99)
Glucose-Capillary: 166 mg/dL — ABNORMAL HIGH (ref 70–99)
Glucose-Capillary: 181 mg/dL — ABNORMAL HIGH (ref 70–99)
Glucose-Capillary: 178 mg/dL — ABNORMAL HIGH (ref 70–99)

## 2023-02-12 NOTE — Progress Notes (Signed)
Physical Therapy Treatment Patient Details Name: Gary [name] MRN: [id number] DOB: 29-Oct-1945 Today's Date: 02/12/2023   History of Present Illness Gary [name] is a 77 y.o. male with medical history significant of   recurrent stage III mantle cell lymphoma s/p 43 cycles of Rituxan and Treanda, depression, insulin-dependent diabetes mellitus, hypertension, hyperlipidemia, who presents to ED with increase falls over the last week at [facility] nursing home. Pt had been discharged to [facility]) after hospital admission in mid April 2024. Prior to recent hospitalization he was living at home alone. He has experienced multiple falls over last month. Pt also reports increased difficulty swallowing over last month. Cervical X-ray reveals Distraction injury at C6-C7, with fracture through the right  anterior superior C7 endplate and widening of the anterior aspect of  the disc space at C6-C7. Prevertebral fluid anterior to C4-T2. Neurosurgery Dr Welton Flakes recommends cervical collar and out patient follow up.    PT Comments    Pt was long sitting in bed with cervical collar in place. He is A and O x 4 and requesting to get to BR. Pt's bed linens soaked with urine/ spilled drink already. Pt requesting to have BM. He continues to require increased time and extensive assistance to safely exit bed, stand, and ambulate short distances. He is at extremely high fall risk. Severe posterior push noted. Gets very anxious with all mobility. Noted skin breakdown in sacral region and even scrotum. Discussed with RN staff concerns and need for bed linen change again. Pt will need extensive PT going forward to maximize independence and safety with all ADLs. Recommend RN staff have +2 assistance for any/all OOB activity.    Recommendations for follow up therapy are one component of a multi-disciplinary discharge planning process, led by the attending physician.  Recommendations may be updated based on patient status,  additional functional criteria and insurance authorization.     Assistance Recommended at Discharge Frequent or constant Supervision/Assistance  Patient can return home with the following A lot of help with bathing/dressing/bathroom;Assistance with cooking/housework;Assist for transportation;Help with stairs or ramp for entrance;Direct supervision/assist for medications management;Direct supervision/assist for financial management;A lot of help with walking and/or transfers   Equipment Recommendations  Other (comment) (defer to next level of care)    Recommendations for Other Services       Precautions / Restrictions Precautions Precautions: Fall;Cervical Precaution Comments: Cervical collar; C7 fracture Restrictions Weight Bearing Restrictions: No     Mobility  Bed Mobility Overal bed mobility: Needs Assistance Bed Mobility: Supine to Sit, Rolling, Sit to Sidelying Rolling: Mod assist Sidelying to sit: Mod assist Supine to sit: Mod assist Sit to supine: Max assist Sit to sidelying: Max assist   Transfers Overall transfer level: Needs assistance Equipment used: Rolling walker (2 wheels) Transfers: Sit to/from Stand Sit to Stand: Mod assist, From elevated surface  General transfer comment: Pt continues to require extensive assistance to stand from even elevated surfaces. severe posterior push. High fall risk. recommend +2 assist for RN staff with any OOB attempts    Ambulation/Gait Ambulation/Gait assistance: Min assist, Mod assist Gait Distance (Feet): 5 Feet Assistive device: Rolling walker (2 wheels) Gait Pattern/deviations: Step-to pattern, Narrow base of support, Trunk flexed Gait velocity: decreased     General Gait Details: Pt was able to ambulate 2 x 5 ft from EOB to BSC placed ~ 5 ft away. he has severel occasions of LOB with intervention to prevent falling. bed linens soaked with urine and pt unable  to make it to [facility] prior to urination all over floor. After  BM/BSC use, pt unwilling to attempt further gait due to pain/fatigue. severe skin breakdown on sacral region and scrotum    Balance Overall balance assessment: Needs assistance Sitting-balance support: No upper extremity supported, Feet supported Sitting balance-Leahy Scale: Fair Sitting balance - Comments: once pt is supported EOB, balance is fair.   Standing balance support: Bilateral upper extremity supported, During functional activity Standing balance-Leahy Scale: Poor Standing balance comment: Pt is extremely high fall risk. Extensive history of falls       Cognition Arousal/Alertness: Awake/alert Behavior During Therapy: WFL for tasks assessed/performed Overall Cognitive Status: Within Functional Limits for tasks assessed    General Comments: anxious, fear of falling.               Pertinent Vitals/Pain Pain Assessment Pain Assessment: 0-10 Pain Score: 7  Pain Location: "all over" Pain Descriptors / Indicators: Sore, Grimacing, Guarding Pain Intervention(s): Limited activity within patient's tolerance, Monitored during session, Premedicated before session, Repositioned     PT Goals (current goals can now be found in the care plan section) Acute Rehab PT Goals Patient Stated Goal: none stated Progress towards PT goals: Not progressing toward goals - comment    Frequency    7X/week      PT Plan Current plan remains appropriate    Co-evaluation     PT goals addressed during session: Mobility/safety with mobility;Balance;Proper use of DME;Strengthening/ROM        AM-PAC PT "6 Clicks" Mobility   Outcome Measure  Help needed turning from your back to your side while in a flat bed without using bedrails?: A Lot Help needed moving from lying on your back to sitting on the side of a flat bed without using bedrails?: A Lot Help needed moving to and from a bed to a chair (including a wheelchair)?: A Lot Help needed standing up from a chair using your arms  (e.g., wheelchair or bedside chair)?: A Lot Help needed to walk in hospital room?: A Lot Help needed climbing 3-5 steps with a railing? : Total 6 Click Score: 11    End of Session Equipment Utilized During Treatment: Gait belt;Cervical collar Activity Tolerance: Patient tolerated treatment well;Patient limited by fatigue Patient left: in bed;with call bell/phone within reach;with bed alarm set Nurse Communication: Mobility status;Precautions;Weight bearing status PT Visit Diagnosis: Muscle weakness (generalized) (M62.81);Difficulty in walking, not elsewhere classified (R26.2);Unsteadiness on feet (R26.81);Repeated falls (R29.6)     Time: 4696-2952 PT Time Calculation (min) (ACUTE ONLY): 24 min  Charges:  $Gait Training: 8-22 mins $Therapeutic Activity: 8-22 mins                     Jetta Lout PTA 02/12/23, 10:29 AM

## 2023-02-12 NOTE — Progress Notes (Signed)
[facility] Progress Note  Patient: Gary [name]    ZOX:[id number]  DOA: 02/05/2023     Date of Service: the patient was seen and examined on 02/12/2023  Chief Complaint  Patient presents with   Fall   Brief hospital course: Gary [name] is a 77 y.o. male with medical history significant of  recurrent stage III mantle cell lymphoma s/p 43 cycles of Rituxan and Treanda, depression, insulin-dependent diabetes mellitus, hypertension, hyperlipidemia, who presents to ED with increase falls over the last week at [facility]. Patient of note has interim history of admission 01/12/2023-01/25/23 at which time patient was treated for  Sepsis due to cellulitis, as well as AKI. Patient at that time had presented to ED with generalized weakness and hypotension.  Patient also has interim history of colonoscopy on 02/04/23 with results noting polyp descending colon/hemorrhoids otherwise normal :Patient currently states he was attempting to go to the bathroom and his leg gave out on him and he was not able to use his arms to hold himself up due to being weak. Sister also aides in history she is concerned about current rehab has has had 3 falls since being admitted on 4/15. She is also concerned about his lack of appetite. She also note him picking at the air when he sleeps which is new for him and she is concerned about medication side-effect. Patient notes no fever/chills/, but does have nausea , no current abdominal pain or chest pain.   ED Course:  Afeb, bp 121/58, HR78, rr 18 sat 100%  UA : LE Moderate, bacteria rare CTH/Cervical spine IMPRESSION: 1. Distraction injury at C6-C7, with fracture through the right anterior superior C7 endplate and widening of the anterior aspect of the disc space at C6-C7. Prevertebral fluid anterior to C4-T2. 2. No acute intracranial process. 3. Small right pleural effusion.   Cxr: IMPRESSION: Chronic interstitial changes consistent with fibrosis. No evidence of active pulmonary  disease.   Wbc 2.4, hgb 9.7 at baseline,plt 110 Inr 1.4  NA 136, K 4.2, glu 155,  Xray pelvis; Osteopenia. No displaced fractures are visualized. If there is high clinical concern for hip fracture recommend dedicated hip views.  EKG ventricular paced  UA + wbc , LE, urine culture negative Tx tylenol, ultram   5/3: Hemodynamically stable.  TOC is still looking for placement.  Patient is high risk for deterioration and mortality based on life limiting underlying comorbidities and poor functional status.  Assessment and Plan:  # Fracture of C7 endplate s/p Fall due to orthostatic hypotension Prevertebral fluid anterior to C4-T2 -case discussed with neurosurgery Dr Welton Flakes who rec cervical collar and out patient follow up  -supportive pain regimen  -Fall due to orthostatic hypotension, and low volume from chronic diarrhea  4/30 patient blood pressure dropped from sitting to standing position, positive for orthostatic vital signs, unable to stand by himself, 2 people assist, while taking orthostatics. Continue fall precautions, ambulate with assistance, turn patient every 2 hours Continue aspiration precautions -PT/OT evaluate -recommending SNF SLP eval done, started on dysphagia 2 diet with thin liquids     # Pyuria, UA positive, urine culture insufficient growth.  SNF was ceftriaxone, discontinued antibiotics on 4/29.     # AKI, baseline creatinine 1.03 on 01/25/23 4/28 Creatinine 1.42--0.87 , improved Monitor renal functions daily Bladder scan 94, ruled out urinary retention Continue IV fluid for gentle hydration    # Chronic persistent hypotension, and orthostatic hypotension causing multiple falls Continued midodrine home dose Patient was  advised to move slowly, continue fall precautions.   # Recurrent stage III mantle cell lymphoma  -s/p 43 cycles of Rituxan and Treanda -Last chemo was in January 2024 -pancytopenia, due to malignancy, stable, no fever. -followed by  oncology as outpatient Dr Orlie Dakin  4/29 texted to Dr. Orlie Dakin for neutropenia, recommended no need of Zarzio at this time as patient is afebrile and no signs of infection.  # Folic acid deficiency, folate level 3.3, started folic acid 1 mg p.o. daily # Anemia most likely secondary to chronic disease and underlying malignancy. Iron profile and B12 level within normal range Monitor H&H and transfuse if hemoglobin less than 7  # Chronic diarrhea  -began with chemo  -followed by GI  -recent c-scope 4/25 noted only polyp and hemorrhoids  -possible cause of patient falls due to orthostasis  4/30 no BM for past 2 days as per RN, seems send diarrhea improving.  Discontinued cholestyramine. Last BM on 5/2  # Depression, continue ssri    # Type 1 diabetes mellitus  Uncontrolled blood glucose could be due to lack of insulin use Diabetic coordinator consulted, Started Semglee 5 units daily and NovoLog sliding scale, monitor CBG Continue diabetic diet 4/28 CBGs low today, held [employer]   # Hyperlipidemia -continue statin   # Hypomagnesemia, mag repleted. Monitor electrolytes and replete as needed.   # Hypothyroid, patient was on Synthroid 50 mcg p.o. daily TSH 5.8, elevated, could be due to acute illness. Continue Synthroid 50 mcg daily, repeat TSH level after 4 to 6 weeks.   # Right lower extremity chronic wound and bilateral lower extremity pigmentation and dry skin Wound care RN consulted Started Lotrisone cream BID  Body mass index is 29.69 kg/m.  Interventions:   Pressure Injury 01/12/23 Buttocks Right;Left Stage 2 -  Partial thickness loss of dermis presenting as a shallow open injury with a red, pink wound bed without slough. (Active)  01/12/23 2330  Location: Buttocks  Location Orientation: Right;Left  Staging: Stage 2 -  Partial thickness loss of dermis presenting as a shallow open injury with a red, pink wound bed without slough.  Wound Description (Comments):    Present on Admission: Yes  Dressing Type Foam - Lift dressing to assess site every shift 02/12/23 0800     Pressure Injury 01/14/23 Coccyx Bilateral Deep Tissue Pressure Injury - Purple or maroon localized area of discolored intact skin or blood-filled blister due to damage of underlying soft tissue from pressure and/or shear. 4 DTI areas noted (Active)  01/14/23 1751  Location: Coccyx  Location Orientation: Bilateral  Staging: Deep Tissue Pressure Injury - Purple or maroon localized area of discolored intact skin or blood-filled blister due to damage of underlying soft tissue from pressure and/or shear.  Wound Description (Comments): 4 DTI areas noted  Present on Admission:   Dressing Type Foam - Lift dressing to assess site every shift 02/12/23 0800     Diet: Diabetic diet DVT Prophylaxis: Subcutaneous Heparin    Advance goals of care discussion: DNR  Family Communication: family was not present at bedside, tried calling sister with no response.  Disposition:  Pt is from [facility], admitted with fall and C7 Fx and uncontrolled DM, still has C-collar. CBG is well controlled now, but has neutropenia, which precludes a safe discharge. Discharge to SNF, clinically stable, medically optimized.  Awaiting for placement to SNF. TOC consulted for discharge planning  Subjective: Patient was lying comfortably in bed when seen today.  Having some back pain.  Neck collar in place  Physical Exam: General.  Frail elderly man, in no acute distress.  Neck collar in place Pulmonary.  Lungs clear bilaterally, normal respiratory effort. CV.  Regular rate and rhythm, no JVD, rub or murmur. Abdomen.  Soft, nontender, nondistended, BS positive. CNS.  Alert and oriented .  No focal neurologic deficit. Extremities.  No edema, no cyanosis, pulses intact and symmetrical. Psychiatry.  Judgment and insight appears normal.   Vitals:   02/11/23 2356 02/12/23 0458 02/12/23 0809 02/12/23 1146  BP: 136/65 (!)  106/54 122/63 119/71  Pulse: (!) 105 93 82 (!) 109  Resp: 18 20 16 16   Temp: 98.2 F (36.8 C) 98.2 F (36.8 C) 98.3 F (36.8 C) 98.2 F (36.8 C)  TempSrc: Oral     SpO2: 100% 100% 100% 100%  Weight:      Height:        Intake/Output Summary (Last 24 hours) at 02/12/2023 1318 Last data filed at 02/11/2023 2241 Gross per 24 hour  Intake 420 ml  Output 450 ml  Net -30 ml    Filed Weights   02/05/23 1905 02/06/23 1044  Weight: 96.6 kg 91.2 kg    Data Reviewed: I have personally reviewed and interpreted daily labs, tele strips, imagings as discussed above. I reviewed all nursing notes, pharmacy notes, vitals, pertinent old records I have discussed plan of care as described above with RN and patient/family.  CBC: Recent Labs  Lab 02/07/23 0614 02/08/23 0530 02/09/23 0615 02/10/23 0530 02/11/23 0600  WBC 1.7* 1.4* 1.4* 1.6* 1.5*  NEUTROABS 0.5* 0.3* 0.4* 0.3* 0.4*  HGB 8.3* 8.5* 8.3* 9.0* 8.4*  HCT 26.7* 27.4* 27.1* 28.6* 27.3*  MCV 95.0 96.1 97.1 96.3 96.8  PLT 112* 87* 88* 103* 102*    Basic Metabolic Panel: Recent Labs  Lab 02/06/23 0108 02/07/23 0614 02/08/23 0530 02/09/23 0615 02/10/23 0530 02/11/23 0600  NA 135 138 139 139 139 140  K 4.3 4.1 4.2 4.4 4.2 4.1  CL 98 104 106 107 109 109  CO2 25 28 26 25 24 25   GLUCOSE 234* 96 159* 194* 181* 122*  BUN 14 17 14 12 12 9   CREATININE 1.16 1.42* 1.12 0.97 1.02 0.87  CALCIUM 7.4* 7.4* 7.4* 7.3* 7.4* 7.6*  MG 1.4* 1.6* 1.8 1.7  --   --   PHOS 2.8 3.1 2.8 2.8  --   --      Studies: No results found.  Scheduled Meds:  calcium carbonate  1 tablet Oral BID WC   Chlorhexidine Gluconate Cloth  6 each Topical Daily   clotrimazole-betamethasone   Topical BID   feeding supplement (GLUCERNA SHAKE)  237 mL Oral TID BM   ferrous sulfate  325 mg Oral Q breakfast   FLUoxetine  20 mg Oral Daily   folic acid  1 mg Oral Daily   heparin  5,000 Units Subcutaneous Q8H   insulin aspart  0-6 Units Subcutaneous Q4H    insulin glargine-yfgn  5 Units Subcutaneous Daily   lactobacillus  1 g Oral TID WC   levothyroxine  50 mcg Oral QAC breakfast   midodrine  10 mg Oral TID WC   pantoprazole  40 mg Oral BID AC   pravastatin  40 mg Oral QHS   Continuous Infusions:   PRN Meds: acetaminophen, glucose, loperamide, morphine injection, ondansetron (ZOFRAN) IV  Time spent: 40 minutes  This record has been created using Conservation officer, historic buildings. Errors have been sought and corrected,but may not  always be located. Such creation errors do not reflect on the standard of care.   Author: Arnetha Courser. MD [facility] 02/12/2023 1:18 PM  To reach On-call, see care teams to locate the attending and reach out to them via [email or web address]. If 7PM-7AM, please contact night-coverage If you still have difficulty reaching the attending provider, please page the [facility] (Director on Call) for [facility] on amion for assistance.

## 2023-02-13 DIAGNOSIS — S0101XA Laceration without foreign body of scalp, initial encounter: Secondary | ICD-10-CM | POA: Diagnosis not present

## 2023-02-13 DIAGNOSIS — W19XXXD Unspecified fall, subsequent encounter: Secondary | ICD-10-CM | POA: Diagnosis not present

## 2023-02-13 DIAGNOSIS — S12691A Other nondisplaced fracture of seventh cervical vertebra, initial encounter for closed fracture: Secondary | ICD-10-CM | POA: Diagnosis not present

## 2023-02-13 DIAGNOSIS — R296 Repeated falls: Secondary | ICD-10-CM | POA: Diagnosis not present

## 2023-02-13 LAB — GLUCOSE, CAPILLARY
Glucose-Capillary: 170 mg/dL — ABNORMAL HIGH (ref 70–99)
Glucose-Capillary: 246 mg/dL — ABNORMAL HIGH (ref 70–99)
Glucose-Capillary: 303 mg/dL — ABNORMAL HIGH (ref 70–99)
Glucose-Capillary: 230 mg/dL — ABNORMAL HIGH (ref 70–99)
Glucose-Capillary: 231 mg/dL — ABNORMAL HIGH (ref 70–99)

## 2023-02-13 MED ORDER — SODIUM CHLORIDE 0.9% FLUSH: 10.0000 mL | INTRAVENOUS | Status: DC | PRN

## 2023-02-13 MED ORDER — SODIUM CHLORIDE 0.9% FLUSH
10.0000 mL | Freq: Two times a day (BID) | INTRAVENOUS | Status: DC
  Administered 2023-02-13 – 2023-02-17 (×8): 10 mL

## 2023-02-13 NOTE — Progress Notes (Signed)
[facility] Progress Note  Patient: Gary [name]    ZOX:[id number]  DOA: 02/05/2023     Date of Service: the patient was seen and examined on 02/13/2023  Chief Complaint  Patient presents with   Fall   Brief hospital course: Gary [name] is a 77 y.o. male with medical history significant of  recurrent stage III mantle cell lymphoma s/p 43 cycles of Rituxan and Treanda, depression, insulin-dependent diabetes mellitus, hypertension, hyperlipidemia, who presents to ED with increase falls over the last week at [facility]. Patient of note has interim history of admission 01/12/2023-01/25/23 at which time patient was treated for  Sepsis due to cellulitis, as well as AKI. Patient at that time had presented to ED with generalized weakness and hypotension.  Patient also has interim history of colonoscopy on 02/04/23 with results noting polyp descending colon/hemorrhoids otherwise normal :Patient currently states he was attempting to go to the bathroom and his leg gave out on him and he was not able to use his arms to hold himself up due to being weak. Sister also aides in history she is concerned about current rehab has has had 3 falls since being admitted on 4/15. She is also concerned about his lack of appetite. She also note him picking at the air when he sleeps which is new for him and she is concerned about medication side-effect. Patient notes no fever/chills/, but does have nausea , no current abdominal pain or chest pain.   ED Course:  Afeb, bp 121/58, HR78, rr 18 sat 100%  UA : LE Moderate, bacteria rare CTH/Cervical spine IMPRESSION: 1. Distraction injury at C6-C7, with fracture through the right anterior superior C7 endplate and widening of the anterior aspect of the disc space at C6-C7. Prevertebral fluid anterior to C4-T2. 2. No acute intracranial process. 3. Small right pleural effusion.   Cxr: IMPRESSION: Chronic interstitial changes consistent with fibrosis. No evidence of active pulmonary  disease.   Wbc 2.4, hgb 9.7 at baseline,plt 110 Inr 1.4  NA 136, K 4.2, glu 155,  Xray pelvis; Osteopenia. No displaced fractures are visualized. If there is high clinical concern for hip fracture recommend dedicated hip views.  EKG ventricular paced  UA + wbc , LE, urine culture negative Tx tylenol, ultram   5/3: Hemodynamically stable.  TOC is still looking for placement.  5/4: PO intake remain poor. Pending placement.  Patient is high risk for deterioration and mortality based on life limiting underlying comorbidities and poor functional status.  Assessment and Plan:  # Fracture of C7 endplate s/p Fall due to orthostatic hypotension Prevertebral fluid anterior to C4-T2 -case discussed with neurosurgery Dr Welton Flakes who rec cervical collar and out patient follow up  -supportive pain regimen  -Fall due to orthostatic hypotension, and low volume from chronic diarrhea  4/30 patient blood pressure dropped from sitting to standing position, positive for orthostatic vital signs, unable to stand by himself, 2 people assist, while taking orthostatics. Continue fall precautions, ambulate with assistance, turn patient every 2 hours Continue aspiration precautions -PT/OT evaluate -recommending SNF SLP eval done, started on dysphagia 2 diet with thin liquids     # Pyuria, UA positive, urine culture insufficient growth.  SNF was ceftriaxone, discontinued antibiotics on 4/29.     # AKI, baseline creatinine 1.03 on 01/25/23 4/28 Creatinine 1.42--0.87 , improved Monitor renal functions daily Bladder scan 94, ruled out urinary retention Continue IV fluid for gentle hydration    # Chronic persistent hypotension, and orthostatic hypotension causing  multiple falls Continued midodrine home dose Patient was advised to move slowly, continue fall precautions.   # Recurrent stage III mantle cell lymphoma  -s/p 43 cycles of Rituxan and Treanda -Last chemo was in January 2024 -pancytopenia, due to  malignancy, stable, no fever. -followed by oncology as outpatient Dr Orlie Dakin  4/29 texted to Dr. Orlie Dakin for neutropenia, recommended no need of Zarzio at this time as patient is afebrile and no signs of infection.  # Folic acid deficiency, folate level 3.3, started folic acid 1 mg p.o. daily # Anemia most likely secondary to chronic disease and underlying malignancy. Iron profile and B12 level within normal range Monitor H&H and transfuse if hemoglobin less than 7  # Chronic diarrhea  -began with chemo  -followed by GI  -recent c-scope 4/25 noted only polyp and hemorrhoids  -possible cause of patient falls due to orthostasis  4/30 no BM for past 2 days as per RN, seems send diarrhea improving.  Discontinued cholestyramine. Last BM on 5/2  # Depression, continue ssri    # Type 1 diabetes mellitus  Uncontrolled blood glucose could be due to lack of insulin use Diabetic coordinator consulted, Started Semglee 5 units daily and NovoLog sliding scale, monitor CBG Continue diabetic diet 4/28 CBGs low today, held [employer]   # Hyperlipidemia -continue statin   # Hypomagnesemia, mag repleted. Monitor electrolytes and replete as needed.   # Hypothyroid, patient was on Synthroid 50 mcg p.o. daily TSH 5.8, elevated, could be due to acute illness. Continue Synthroid 50 mcg daily, repeat TSH level after 4 to 6 weeks.   # Right lower extremity chronic wound and bilateral lower extremity pigmentation and dry skin Wound care RN consulted Started Lotrisone cream BID  Body mass index is 29.69 kg/m.  Interventions:   Pressure Injury 01/12/23 Buttocks Right;Left Stage 2 -  Partial thickness loss of dermis presenting as a shallow open injury with a red, pink wound bed without slough. (Active)  01/12/23 2330  Location: Buttocks  Location Orientation: Right;Left  Staging: Stage 2 -  Partial thickness loss of dermis presenting as a shallow open injury with a red, pink wound bed without  slough.  Wound Description (Comments):   Present on Admission: Yes  Dressing Type Foam - Lift dressing to assess site every shift 02/12/23 1906     Pressure Injury 01/14/23 Coccyx Bilateral Deep Tissue Pressure Injury - Purple or maroon localized area of discolored intact skin or blood-filled blister due to damage of underlying soft tissue from pressure and/or shear. 4 DTI areas noted (Active)  01/14/23 1751  Location: Coccyx  Location Orientation: Bilateral  Staging: Deep Tissue Pressure Injury - Purple or maroon localized area of discolored intact skin or blood-filled blister due to damage of underlying soft tissue from pressure and/or shear.  Wound Description (Comments): 4 DTI areas noted  Present on Admission:   Dressing Type Foam - Lift dressing to assess site every shift 02/12/23 1906     Diet: Diabetic diet DVT Prophylaxis: Subcutaneous Heparin    Advance goals of care discussion: DNR  Family Communication: Unable to reach family.  Disposition:  Pt is from [facility], admitted with fall and C7 Fx and uncontrolled DM, still has C-collar. CBG is well controlled now, but has neutropenia, which precludes a safe discharge. Discharge to SNF, clinically stable, medically optimized.  Awaiting for placement to SNF. TOC consulted for discharge planning  Subjective: Patient was sitting in chair, lunch in front of him, stating that he is not  hungry. No other complaints.  Physical Exam: General. Frail elderly man, In no acute distress. Neck collar in place. Pulmonary.  Lungs clear bilaterally, normal respiratory effort. CV.  Regular rate and rhythm, no JVD, rub or murmur. Abdomen.  Soft, nontender, nondistended, BS positive. CNS.  Alert and oriented .  No focal neurologic deficit. Extremities.  Bilateral LE with Ace wrap, bilateral feet with signs of chronic venous congestion. Psychiatry.  Judgment and insight appears normal.   Vitals:   02/13/23 0916 02/13/23 1215 02/13/23 1520 02/13/23  1535  BP: 105/62 (!) 96/32 (!) 89/50 (!) 108/52  Pulse: (!) 108 63 (!) 107 (!) 108  Resp: 18 20 20    Temp: (!) 97.5 F (36.4 C) 98.6 F (37 C) 99.1 F (37.3 C)   TempSrc:      SpO2: 100% 100% 100%   Weight:      Height:        Intake/Output Summary (Last 24 hours) at 02/13/2023 1637 Last data filed at 02/13/2023 0427 Gross per 24 hour  Intake --  Output 450 ml  Net -450 ml    Filed Weights   02/05/23 1905 02/06/23 1044  Weight: 96.6 kg 91.2 kg    Data Reviewed: I have personally reviewed and interpreted daily labs, tele strips, imagings as discussed above. I reviewed all nursing notes, pharmacy notes, vitals, pertinent old records I have discussed plan of care as described above with RN and patient/family.  CBC: Recent Labs  Lab 02/07/23 0614 02/08/23 0530 02/09/23 0615 02/10/23 0530 02/11/23 0600  WBC 1.7* 1.4* 1.4* 1.6* 1.5*  NEUTROABS 0.5* 0.3* 0.4* 0.3* 0.4*  HGB 8.3* 8.5* 8.3* 9.0* 8.4*  HCT 26.7* 27.4* 27.1* 28.6* 27.3*  MCV 95.0 96.1 97.1 96.3 96.8  PLT 112* 87* 88* 103* 102*    Basic Metabolic Panel: Recent Labs  Lab 02/07/23 0614 02/08/23 0530 02/09/23 0615 02/10/23 0530 02/11/23 0600  NA 138 139 139 139 140  K 4.1 4.2 4.4 4.2 4.1  CL 104 106 107 109 109  CO2 28 26 25 24 25   GLUCOSE 96 159* 194* 181* 122*  BUN 17 14 12 12 9   CREATININE 1.42* 1.12 0.97 1.02 0.87  CALCIUM 7.4* 7.4* 7.3* 7.4* 7.6*  MG 1.6* 1.8 1.7  --   --   PHOS 3.1 2.8 2.8  --   --      Studies: No results found.  Scheduled Meds:  calcium carbonate  1 tablet Oral BID WC   Chlorhexidine Gluconate Cloth  6 each Topical Daily   clotrimazole-betamethasone   Topical BID   feeding supplement (GLUCERNA SHAKE)  237 mL Oral TID BM   ferrous sulfate  325 mg Oral Q breakfast   FLUoxetine  20 mg Oral Daily   folic acid  1 mg Oral Daily   heparin  5,000 Units Subcutaneous Q8H   insulin aspart  0-6 Units Subcutaneous Q4H   insulin glargine-yfgn  5 Units Subcutaneous Daily    lactobacillus  1 g Oral TID WC   levothyroxine  50 mcg Oral QAC breakfast   midodrine  10 mg Oral TID WC   pantoprazole  40 mg Oral BID AC   pravastatin  40 mg Oral QHS   Continuous Infusions:   PRN Meds: acetaminophen, glucose, loperamide, morphine injection, ondansetron (ZOFRAN) IV  Time spent: 38 minutes  This record has been created using Conservation officer, historic buildings. Errors have been sought and corrected,but may not always be located. Such creation errors do not reflect  on the standard of care.   Author: Arnetha Courser. MD [facility] 02/13/2023 4:37 PM  To reach On-call, see care teams to locate the attending and reach out to them via [email or web address]. If 7PM-7AM, please contact night-coverage If you still have difficulty reaching the attending provider, please page the [facility] (Director on Call) for [facility] on amion for assistance.

## 2023-02-13 NOTE — Progress Notes (Signed)
Physical Therapy Treatment Patient Details Name: Gary [name] MRN: [id number] DOB: Apr 24, 1946 Today's Date: 02/13/2023   History of Present Illness Gary [name] is a 77 y.o. male with medical history significant of   recurrent stage III mantle cell lymphoma s/p 43 cycles of Rituxan and Treanda, depression, insulin-dependent diabetes mellitus, hypertension, hyperlipidemia, who presents to ED with increase falls over the last week at [facility] nursing home. Pt had been discharged to [facility]) after hospital admission in mid April 2024. Prior to recent hospitalization he was living at home alone. He has experienced multiple falls over last month. Pt also reports increased difficulty swallowing over last month. Cervical X-ray reveals Distraction injury at C6-C7, with fracture through the right  anterior superior C7 endplate and widening of the anterior aspect of  the disc space at C6-C7. Prevertebral fluid anterior to C4-T2. Neurosurgery Dr Welton Flakes recommends cervical collar and out patient follow up.    PT Comments    Pt was laying diagonally in bed upon arrival. He is awake and alert but only truly oriented x 2. He is agreeable to OOB activity. Barely touch breakfast tray at bedside. RN tech called and assisted pt with eating post session. Pt required more assistance to exit bed today. He remains extremely anxious/pain limited. Notable LUE weeping not observed in previous few days. HR more irregular today as well. HR bounced between 70s and 125 bpm without pt performing any mobility/transfers. He endorses feeling ok and is eager for OOB activity. Pt performed log roll R to short sit. Sat EOB x several minutes prior to standing and ambulating 8 ft with chair follow. Distance limited by fatigue/anxiety/pain. Pt remains very debilitated and will require extensive PT going forward. Acute PT will continue to follow and progress as able per current POC. RN tech in room post session assisting pt with  eating breakfast.     Recommendations for follow up therapy are one component of a multi-disciplinary discharge planning process, led by the attending physician.  Recommendations may be updated based on patient status, additional functional criteria and insurance authorization.  Follow Up Recommendations  Can patient physically be transported by private vehicle: No    Assistance Recommended at Discharge Frequent or constant Supervision/Assistance  Patient can return home with the following A lot of help with bathing/dressing/bathroom;Assistance with cooking/housework;Assist for transportation;Help with stairs or ramp for entrance;Direct supervision/assist for medications management;Direct supervision/assist for financial management;A lot of help with walking and/or transfers   Equipment Recommendations  Other (comment) (Defer to next level of care)       Precautions / Restrictions Precautions Precautions: Fall;Cervical Precaution Comments: Cervical collar; C7 fracture Restrictions Weight Bearing Restrictions: No     Mobility  Bed Mobility Overal bed mobility: Needs Assistance Bed Mobility: Supine to Sit, Rolling, Sit to Sidelying Rolling: Max assist Sidelying to sit: Max assist Supine to sit: Max assist  General bed mobility comments: Pt required more assistance to exit bed. increased time + step by step vcs. HR in A-fib between 78 and 125bpm    Transfers Overall transfer level: Needs assistance Equipment used: Rolling walker (2 wheels) Transfers: Sit to/from Stand Sit to Stand: Mod assist  General transfer comment: pt did stand EOB 3 x. Pt likes to use rocking method to achieve standing. Vcs for fwd wt shift. still present with posterior push    Ambulation/Gait Ambulation/Gait assistance: Min assist, Mod assist Gait Distance (Feet): 8 Feet Assistive device: Rolling walker (2 wheels) Gait Pattern/deviations: Step-to pattern, Narrow  base of support, Trunk flexed        General Gait Details: Pt was able to ambulate ~ 8 ft but need constant min assist with occasional mod assist to prevent falling. pt remains extremely anxious with all standing activity. Pt more SOB today with less activity. RN/MD made aware. LUE was wheeping today.      Balance Overall balance assessment: Needs assistance Sitting-balance support: No upper extremity supported, Feet supported Sitting balance-Leahy Scale: Fair     Standing balance support: Bilateral upper extremity supported, During functional activity Standing balance-Leahy Scale: Poor Standing balance comment: pt remains extremely high fall risk      Cognition Arousal/Alertness: Awake/alert Behavior During Therapy: WFL for tasks assessed/performed Overall Cognitive Status: Within Functional Limits for tasks assessed      General Comments: Pt is alert but overall disorineted x 2.Able to follow commands however poor insight of current situation and very anxious with mobility. Vcs for relaxation techniques throughout               Pertinent Vitals/Pain Pain Assessment Pain Assessment: 0-10 Pain Score: 7  Pain Location: neck/back Pain Descriptors / Indicators: Sore, Grimacing, Guarding Pain Intervention(s): Limited activity within patient's tolerance, Monitored during session, Premedicated before session, Repositioned     PT Goals (current goals can now be found in the care plan section) Acute Rehab PT Goals Patient Stated Goal: none stated Progress towards PT goals: Not progressing toward goals - comment    Frequency    7X/week      PT Plan Current plan remains appropriate    Co-evaluation     PT goals addressed during session: Mobility/safety with mobility;Balance;Strengthening/ROM;Proper use of DME        AM-PAC PT "6 Clicks" Mobility   Outcome Measure  Help needed turning from your back to your side while in a flat bed without using bedrails?: A Lot Help needed moving from lying on your  back to sitting on the side of a flat bed without using bedrails?: A Lot Help needed moving to and from a bed to a chair (including a wheelchair)?: A Lot Help needed standing up from a chair using your arms (e.g., wheelchair or bedside chair)?: A Lot Help needed to walk in hospital room?: A Lot Help needed climbing 3-5 steps with a railing? : Total 6 Click Score: 11    End of Session Equipment Utilized During Treatment: Gait belt;Cervical collar Activity Tolerance: Patient tolerated treatment well;Patient limited by fatigue Patient left: in chair;with call bell/phone within reach;with chair alarm set;with nursing/sitter in room (RN tech called/ in room to assist pt with eating breakfast) Nurse Communication: Mobility status;Precautions;Weight bearing status PT Visit Diagnosis: Muscle weakness (generalized) (M62.81);Difficulty in walking, not elsewhere classified (R26.2);Unsteadiness on feet (R26.81);Repeated falls (R29.6)     Time: 0950-1010 PT Time Calculation (min) (ACUTE ONLY): 20 min  Charges:  $Therapeutic Activity: 8-22 mins                    Jetta Lout PTA 02/13/23, 10:46 AM

## 2023-02-14 ENCOUNTER — Inpatient Hospital Stay: Payer: Medicare Other

## 2023-02-14 DIAGNOSIS — R296 Repeated falls: Secondary | ICD-10-CM | POA: Diagnosis not present

## 2023-02-14 DIAGNOSIS — S0101XA Laceration without foreign body of scalp, initial encounter: Secondary | ICD-10-CM | POA: Diagnosis not present

## 2023-02-14 DIAGNOSIS — S12691A Other nondisplaced fracture of seventh cervical vertebra, initial encounter for closed fracture: Secondary | ICD-10-CM | POA: Diagnosis not present

## 2023-02-14 DIAGNOSIS — W19XXXD Unspecified fall, subsequent encounter: Secondary | ICD-10-CM | POA: Diagnosis not present

## 2023-02-14 LAB — GLUCOSE, CAPILLARY
Glucose-Capillary: 136 mg/dL — ABNORMAL HIGH (ref 70–99)
Glucose-Capillary: 162 mg/dL — ABNORMAL HIGH (ref 70–99)
Glucose-Capillary: 171 mg/dL — ABNORMAL HIGH (ref 70–99)
Glucose-Capillary: 198 mg/dL — ABNORMAL HIGH (ref 70–99)
Glucose-Capillary: 203 mg/dL — ABNORMAL HIGH (ref 70–99)
Glucose-Capillary: 225 mg/dL — ABNORMAL HIGH (ref 70–99)

## 2023-02-14 LAB — BLOOD GAS, ARTERIAL
Acid-Base Excess: 1.7 mmol/L (ref 0.0–2.0)
Bicarbonate: 27.2 mmol/L (ref 20.0–28.0)
O2 Content: 2 L/min
O2 Saturation: 98.2 %
Patient temperature: 37
pCO2 arterial: 45 mmHg (ref 32–48)
pH, Arterial: 7.39 (ref 7.35–7.45)
pO2, Arterial: 108 mmHg (ref 83–108)

## 2023-02-14 MED ORDER — LACTATED RINGERS IV SOLN: INTRAVENOUS | Status: DC

## 2023-02-14 NOTE — Progress Notes (Addendum)
[facility] Progress Note  Patient: Gary [name]    ZHY:[id number]  DOA: 02/05/2023     Date of Service: the patient was seen and examined on 02/14/2023  Chief Complaint  Patient presents with   Fall   Brief hospital course: Gary [name] is a 77 y.o. male with medical history significant of  recurrent stage III mantle cell lymphoma s/p 43 cycles of Rituxan and Treanda, depression, insulin-dependent diabetes mellitus, hypertension, hyperlipidemia, who presents to ED with increase falls over the last week at [facility]. Patient of note has interim history of admission 01/12/2023-01/25/23 at which time patient was treated for  Sepsis due to cellulitis, as well as AKI. Patient at that time had presented to ED with generalized weakness and hypotension.  Patient also has interim history of colonoscopy on 02/04/23 with results noting polyp descending colon/hemorrhoids otherwise normal :Patient currently states he was attempting to go to the bathroom and his leg gave out on him and he was not able to use his arms to hold himself up due to being weak. Sister also aides in history she is concerned about current rehab has has had 3 falls since being admitted on 4/15. She is also concerned about his lack of appetite. She also note him picking at the air when he sleeps which is new for him and she is concerned about medication side-effect. Patient notes no fever/chills/, but does have nausea , no current abdominal pain or chest pain.   ED Course:  Afeb, bp 121/58, HR78, rr 18 sat 100%  UA : LE Moderate, bacteria rare CTH/Cervical spine IMPRESSION: 1. Distraction injury at C6-C7, with fracture through the right anterior superior C7 endplate and widening of the anterior aspect of the disc space at C6-C7. Prevertebral fluid anterior to C4-T2. 2. No acute intracranial process. 3. Small right pleural effusion.   Cxr: IMPRESSION: Chronic interstitial changes consistent with fibrosis. No evidence of active pulmonary  disease.   Wbc 2.4, hgb 9.7 at baseline,plt 110 Inr 1.4  NA 136, K 4.2, glu 155,  Xray pelvis; Osteopenia. No displaced fractures are visualized. If there is high clinical concern for hip fracture recommend dedicated hip views.  EKG ventricular paced  UA + wbc , LE, urine culture negative Tx tylenol, ultram   5/3: Hemodynamically stable.  TOC is still looking for placement.  5/4: PO intake remain poor. Pending placement.  5/5: Patient was very somnolent, unable to keep him awake to have any communication. CT head and ABG ordered.  Patient is high risk for deterioration and mortality based on life limiting underlying comorbidities and poor functional status.  Assessment and Plan:  # Fracture of C7 endplate s/p Fall due to orthostatic hypotension Prevertebral fluid anterior to C4-T2 -case discussed with neurosurgery Dr Welton Flakes who rec cervical collar and out patient follow up  -supportive pain regimen  -Fall due to orthostatic hypotension, and low volume from chronic diarrhea  4/30 patient blood pressure dropped from sitting to standing position, positive for orthostatic vital signs, unable to stand by himself, 2 people assist, while taking orthostatics. Continue fall precautions, ambulate with assistance, turn patient every 2 hours Continue aspiration precautions -PT/OT evaluate -recommending SNF SLP eval done, started on dysphagia 2 diet with thin liquids   Altered Mental Status: More somnolent, unable to communicate. Refusing most of po intake. CT head ABG   # Pyuria, UA positive, urine culture insufficient growth.  SNF was ceftriaxone, discontinued antibiotics on 4/29.     # AKI, baseline creatinine  1.03 on 01/25/23 4/28 Creatinine 1.42--0.87 , improved Monitor renal functions daily Bladder scan 94, ruled out urinary retention Continue IV fluid for gentle hydration    # Chronic persistent hypotension, and orthostatic hypotension causing multiple falls Continued midodrine  home dose Patient was advised to move slowly, continue fall precautions.   # Recurrent stage III mantle cell lymphoma  -s/p 43 cycles of Rituxan and Treanda -Last chemo was in January 2024 -pancytopenia, due to malignancy, stable, no fever. -followed by oncology as outpatient Dr Orlie Dakin  4/29 texted to Dr. Orlie Dakin for neutropenia, recommended no need of Zarzio at this time as patient is afebrile and no signs of infection.  # Folic acid deficiency, folate level 3.3, started folic acid 1 mg p.o. daily # Anemia most likely secondary to chronic disease and underlying malignancy. Iron profile and B12 level within normal range Monitor H&H and transfuse if hemoglobin less than 7  # Chronic diarrhea  -began with chemo  -followed by GI  -recent c-scope 4/25 noted only polyp and hemorrhoids  -possible cause of patient falls due to orthostasis  4/30 no BM for past 2 days as per RN, seems send diarrhea improving.  Discontinued cholestyramine. Last BM on 5/2  # Depression, continue ssri    # Type 1 diabetes mellitus  Uncontrolled blood glucose could be due to lack of insulin use Diabetic coordinator consulted, Started Semglee 5 units daily and NovoLog sliding scale, monitor CBG Continue diabetic diet 4/28 CBGs low today, held [employer]   # Hyperlipidemia -continue statin   # Hypomagnesemia, mag repleted. Monitor electrolytes and replete as needed.   # Hypothyroid, patient was on Synthroid 50 mcg p.o. daily TSH 5.8, elevated, could be due to acute illness. Continue Synthroid 50 mcg daily, repeat TSH level after 4 to 6 weeks.   # Right lower extremity chronic wound and bilateral lower extremity pigmentation and dry skin Wound care RN consulted Started Lotrisone cream BID  Body mass index is 29.69 kg/m.  Interventions:   Pressure Injury 01/12/23 Buttocks Right;Left Stage 2 -  Partial thickness loss of dermis presenting as a shallow open injury with a red, pink wound bed without  slough. (Active)  01/12/23 2330  Location: Buttocks  Location Orientation: Right;Left  Staging: Stage 2 -  Partial thickness loss of dermis presenting as a shallow open injury with a red, pink wound bed without slough.  Wound Description (Comments):   Present on Admission: Yes  Dressing Type Foam - Lift dressing to assess site every shift 02/13/23 1958     Pressure Injury 01/14/23 Coccyx Bilateral Deep Tissue Pressure Injury - Purple or maroon localized area of discolored intact skin or blood-filled blister due to damage of underlying soft tissue from pressure and/or shear. 4 DTI areas noted (Active)  01/14/23 1751  Location: Coccyx  Location Orientation: Bilateral  Staging: Deep Tissue Pressure Injury - Purple or maroon localized area of discolored intact skin or blood-filled blister due to damage of underlying soft tissue from pressure and/or shear.  Wound Description (Comments): 4 DTI areas noted  Present on Admission:   Dressing Type Foam - Lift dressing to assess site every shift 02/14/23 0800     Diet: Diabetic diet DVT Prophylaxis: Subcutaneous Heparin    Advance goals of care discussion: DNR  Family Communication: Talked with sister on phone.  Disposition:  Pt is from [facility], admitted with fall and C7 Fx and uncontrolled DM, still has C-collar. CBG is well controlled now, but has neutropenia, which precludes a  safe discharge. Discharge to SNF, clinically stable, medically optimized.  Awaiting for placement to SNF. TOC consulted for discharge planning  Subjective: Patient was very somnolent when seen today. Arousable but quickly going back to sleep.  Just nodding no to pain.  Physical Exam: General.  Lethargic and frail elderly man, in no acute distress.  Neck collar in place Pulmonary.  Lungs clear bilaterally, normal respiratory effort. CV.  Regular rate and rhythm, no JVD, rub or murmur. Abdomen.  Soft, nontender, nondistended, BS positive. CNS.  Somnolent and was not  participating with exam Extremities.  No edema, no cyanosis, pulses intact and symmetrical.    Vitals:   02/14/23 0101 02/14/23 0510 02/14/23 0820 02/14/23 1154  BP: (!) 113/54 120/67 99/65 (!) 113/99  Pulse: 95 82 82 75  Resp:  12 16 16   Temp:  97.9 F (36.6 C) 97.6 F (36.4 C) 98.6 F (37 C)  TempSrc:      SpO2: 94% 97% 99% 100%  Weight:      Height:        Intake/Output Summary (Last 24 hours) at 02/14/2023 1550 Last data filed at 02/14/2023 0800 Gross per 24 hour  Intake 100 ml  Output --  Net 100 ml    Filed Weights   02/05/23 1905 02/06/23 1044  Weight: 96.6 kg 91.2 kg    Data Reviewed: I have personally reviewed and interpreted daily labs, tele strips, imagings as discussed above. I reviewed all nursing notes, pharmacy notes, vitals, pertinent old records I have discussed plan of care as described above with RN and patient/family.  CBC: Recent Labs  Lab 02/08/23 0530 02/09/23 0615 02/10/23 0530 02/11/23 0600  WBC 1.4* 1.4* 1.6* 1.5*  NEUTROABS 0.3* 0.4* 0.3* 0.4*  HGB 8.5* 8.3* 9.0* 8.4*  HCT 27.4* 27.1* 28.6* 27.3*  MCV 96.1 97.1 96.3 96.8  PLT 87* 88* 103* 102*    Basic Metabolic Panel: Recent Labs  Lab 02/08/23 0530 02/09/23 0615 02/10/23 0530 02/11/23 0600  NA 139 139 139 140  K 4.2 4.4 4.2 4.1  CL 106 107 109 109  CO2 26 25 24 25   GLUCOSE 159* 194* 181* 122*  BUN 14 12 12 9   CREATININE 1.12 0.97 1.02 0.87  CALCIUM 7.4* 7.3* 7.4* 7.6*  MG 1.8 1.7  --   --   PHOS 2.8 2.8  --   --      Studies: No results found.  Scheduled Meds:  calcium carbonate  1 tablet Oral BID WC   Chlorhexidine Gluconate Cloth  6 each Topical Daily   clotrimazole-betamethasone   Topical BID   feeding supplement (GLUCERNA SHAKE)  237 mL Oral TID BM   ferrous sulfate  325 mg Oral Q breakfast   FLUoxetine  20 mg Oral Daily   folic acid  1 mg Oral Daily   heparin  5,000 Units Subcutaneous Q8H   insulin aspart  0-6 Units Subcutaneous Q4H   insulin glargine-yfgn   5 Units Subcutaneous Daily   lactobacillus  1 g Oral TID WC   levothyroxine  50 mcg Oral QAC breakfast   midodrine  10 mg Oral TID WC   pantoprazole  40 mg Oral BID AC   pravastatin  40 mg Oral QHS   sodium chloride flush  10-40 mL Intracatheter Q12H   Continuous Infusions:   PRN Meds: acetaminophen, glucose, loperamide, morphine injection, ondansetron (ZOFRAN) IV, sodium chloride flush  Time spent: 45 minutes  This record has been created using Conservation officer, historic buildings.  Errors have been sought and corrected,but may not always be located. Such creation errors do not reflect on the standard of care.   Author: Arnetha Courser. MD [facility] 02/14/2023 3:50 PM  To reach On-call, see care teams to locate the attending and reach out to them via [email or web address]. If 7PM-7AM, please contact night-coverage If you still have difficulty reaching the attending provider, please page the [facility] (Director on Call) for [facility] on amion for assistance.

## 2023-02-14 NOTE — Progress Notes (Signed)
Patient sent to MRI via bed in stable condition. 

## 2023-02-14 NOTE — Progress Notes (Signed)
PT Cancellation Note  Patient Details Name: Gary [name] MRN: [id number] DOB: October 21, 1945   Cancelled Treatment:    Reason Eval/Treat Not Completed: Fatigue/lethargy limiting ability to participate  Pt in bed.  Asleep.  He does awaken to voice but drifts back to sleep during conversation.  Sats checked and [facility] but now on 2 lpm [place].  Unable to remain awake for session so it is deferred at this time.  Will continue tomorrow as appropriate.   Danielle Dess 02/14/2023, 10:35 AM

## 2023-02-14 NOTE — Progress Notes (Signed)
Patient received from MRI via bed in stable condition. 

## 2023-02-15 ENCOUNTER — Other Ambulatory Visit: Payer: Self-pay | Admitting: Internal Medicine

## 2023-02-15 DIAGNOSIS — R296 Repeated falls: Secondary | ICD-10-CM | POA: Diagnosis not present

## 2023-02-15 DIAGNOSIS — S0101XA Laceration without foreign body of scalp, initial encounter: Secondary | ICD-10-CM | POA: Diagnosis not present

## 2023-02-15 DIAGNOSIS — S12691A Other nondisplaced fracture of seventh cervical vertebra, initial encounter for closed fracture: Secondary | ICD-10-CM | POA: Diagnosis not present

## 2023-02-15 DIAGNOSIS — W19XXXD Unspecified fall, subsequent encounter: Secondary | ICD-10-CM | POA: Diagnosis not present

## 2023-02-15 LAB — GLUCOSE, CAPILLARY
Glucose-Capillary: 128 mg/dL — ABNORMAL HIGH (ref 70–99)
Glucose-Capillary: 140 mg/dL — ABNORMAL HIGH (ref 70–99)
Glucose-Capillary: 165 mg/dL — ABNORMAL HIGH (ref 70–99)
Glucose-Capillary: 166 mg/dL — ABNORMAL HIGH (ref 70–99)
Glucose-Capillary: 190 mg/dL — ABNORMAL HIGH (ref 70–99)
Glucose-Capillary: 195 mg/dL — ABNORMAL HIGH (ref 70–99)

## 2023-02-15 LAB — BASIC METABOLIC PANEL
Anion gap: 9 (ref 5–15)
BUN: 9 mg/dL (ref 8–23)
CO2: 25 mmol/L (ref 22–32)
Calcium: 7.8 mg/dL — ABNORMAL LOW (ref 8.9–10.3)
Chloride: 107 mmol/L (ref 98–111)
Creatinine, Ser: 0.98 mg/dL (ref 0.61–1.24)
GFR, Estimated: >60 mL/min (ref >60)
Glucose, Bld: 164 mg/dL — ABNORMAL HIGH (ref 70–99)
Potassium: 4 mmol/L (ref 3.5–5.1)
Sodium: 141 mmol/L (ref 135–145)

## 2023-02-15 LAB — CBC
HCT: 28.9 % — ABNORMAL LOW (ref 39.0–52.0)
Hemoglobin: 8.9 g/dL — ABNORMAL LOW (ref 13.0–17.0)
MCH: 30.1 pg (ref 26.0–34.0)
MCHC: 30.8 g/dL (ref 30.0–36.0)
MCV: 97.6 fL (ref 80.0–100.0)
Platelets: 98 10*3/uL — ABNORMAL LOW (ref 150–400)
RBC: 2.96 MIL/uL — ABNORMAL LOW (ref 4.22–5.81)
RDW: 16.3 % — ABNORMAL HIGH (ref 11.5–15.5)
WBC: 1.6 10*3/uL — ABNORMAL LOW (ref 4.0–10.5)
nRBC: 0 % (ref 0.0–0.2)

## 2023-02-15 MED ORDER — MORPHINE SULFATE (PF) 2 MG/ML IV SOLN: 2.0000 mg | INTRAVENOUS | Status: DC | PRN

## 2023-02-15 MED ORDER — ACETAMINOPHEN 325 MG PO TABS: 650.0000 mg | ORAL_TABLET | Freq: Four times a day (QID) | ORAL | Status: DC | PRN

## 2023-02-15 MED ORDER — LACTATED RINGERS IV BOLUS
500.0000 mL | Freq: Once | INTRAVENOUS | Status: AC
  Administered 2023-02-15: 500 mL via INTRAVENOUS

## 2023-02-15 NOTE — Progress Notes (Signed)
Physical Therapy Treatment Patient Details Name: Gary [name] MRN: [id number] DOB: 1946/05/04 Today's Date: 02/15/2023   History of Present Illness Gary [name] is a 77 y.o. male with medical history significant of   recurrent stage III mantle cell lymphoma s/p 43 cycles of Rituxan and Treanda, depression, insulin-dependent diabetes mellitus, hypertension, hyperlipidemia, who presents to ED with increase falls over the last week at [facility] nursing home. Pt had been discharged to [facility]) after hospital admission in mid April 2024. Prior to recent hospitalization he was living at home alone. He has experienced multiple falls over last month. Pt also reports increased difficulty swallowing over last month. Cervical X-ray reveals Distraction injury at C6-C7, with fracture through the right  anterior superior C7 endplate and widening of the anterior aspect of  the disc space at C6-C7. Prevertebral fluid anterior to C4-T2. Neurosurgery Dr Welton Flakes recommends cervical collar and out patient follow up.    PT Comments    Pt in bed, awake and agrees to session but asks not to get into recliner as he says it is too uncomfortable for him.  He initiates getting to EOB but does need mod a to get fully upright.  Steady in sitting. Remains sitting for vitals with tech then wishes to try to stand.  Min a x 2 at bedside with min/mod a x 2 for sidesteps along bed with walker support as he is hesitant due to feeling weak and unsteady.  Generally fatigued with effort.  Sats remain WFL but HR does go down today with activity.  Returned to supine with mod/max a x 2 and positioned for comfort.  BUE's and LE's appear to have increased edema from last time I worked with pt.  He is pleased that he was able to move a bit today.   Recommendations for follow up therapy are one component of a multi-disciplinary discharge planning process, led by the attending physician.  Recommendations may be updated based on patient  status, additional functional criteria and insurance authorization.  Follow Up Recommendations       Assistance Recommended at Discharge Frequent or constant Supervision/Assistance  Patient can return home with the following A lot of help with bathing/dressing/bathroom;Assistance with cooking/housework;Assist for transportation;Help with stairs or ramp for entrance;Direct supervision/assist for medications management;Direct supervision/assist for financial management;A lot of help with walking and/or transfers   Equipment Recommendations       Recommendations for Other Services       Precautions / Restrictions Precautions Precautions: Fall;Cervical Precaution Comments: Cervical collar; C7 fracture Restrictions Weight Bearing Restrictions: No     Mobility  Bed Mobility Overal bed mobility: Needs Assistance Bed Mobility: Supine to Sit, Sit to Supine     Supine to sit: Mod assist, +2 for physical assistance Sit to supine: Max assist, +2 for physical assistance   General bed mobility comments: increased assist for comfort Patient Response: Cooperative  Transfers Overall transfer level: Needs assistance Equipment used: Rolling walker (2 wheels) Transfers: Sit to/from Stand Sit to Stand: Min assist, Mod assist, +2 physical assistance           General transfer comment: stood at EOB with sidesteps to reposition up in bed -fatigued with effort    Ambulation/Gait Ambulation/Gait assistance: Min assist, Mod assist, +2 physical assistance Gait Distance (Feet): 2 Feet Assistive device: Rolling walker (2 wheels) Gait Pattern/deviations: Step-to pattern Gait velocity: decreased     General Gait Details: hesitant due to feeling unsteady but is ablet o step to  reposition in bed.   Stairs             Wheelchair Mobility    Modified Rankin (Stroke Patients Only)       Balance Overall balance assessment: Needs assistance Sitting-balance support: No upper  extremity supported, Feet supported Sitting balance-Leahy Scale: Fair Sitting balance - Comments: once pt is supported EOB, balance is fair.   Standing balance support: Bilateral upper extremity supported, During functional activity Standing balance-Leahy Scale: Poor Standing balance comment: pt remains extremely high fall risk - +2 assist for safety and hands on assist                            Cognition Arousal/Alertness: Awake/alert Behavior During Therapy: WFL for tasks assessed/performed Overall Cognitive Status: Within Functional Limits for tasks assessed                                          Exercises Other Exercises Other Exercises: sitting EOB x 10 minutes with no LOB and generally steady    General Comments        Pertinent Vitals/Pain Pain Assessment Pain Assessment: Faces Faces Pain Scale: Hurts little more Pain Location: neck/back Pain Descriptors / Indicators: Sore, Grimacing, Guarding Pain Intervention(s): Limited activity within patient's tolerance, Monitored during session, Repositioned    Home Living                          Prior Function            PT Goals (current goals can now be found in the care plan section) Progress towards PT goals: Progressing toward goals    Frequency    7X/week      PT Plan Current plan remains appropriate    Co-evaluation              AM-PAC PT "6 Clicks" Mobility   Outcome Measure  Help needed turning from your back to your side while in a flat bed without using bedrails?: A Lot Help needed moving from lying on your back to sitting on the side of a flat bed without using bedrails?: A Lot Help needed moving to and from a bed to a chair (including a wheelchair)?: A Lot Help needed standing up from a chair using your arms (e.g., wheelchair or bedside chair)?: A Lot Help needed to walk in hospital room?: A Lot Help needed climbing 3-5 steps with a railing? :  Total 6 Click Score: 11    End of Session Equipment Utilized During Treatment: Gait belt;Cervical collar Activity Tolerance: Patient limited by fatigue Patient left: in bed;with call bell/phone within reach;with bed alarm set Nurse Communication: Mobility status PT Visit Diagnosis: Muscle weakness (generalized) (M62.81);Difficulty in walking, not elsewhere classified (R26.2);Unsteadiness on feet (R26.81);Repeated falls (R29.6)     Time: 7846-9629 PT Time Calculation (min) (ACUTE ONLY): 23 min  Charges:  $Therapeutic Activity: 23-37 mins         Danielle Dess, PTA 02/15/23, 12:36 PM

## 2023-02-15 NOTE — Progress Notes (Signed)
[facility] Progress Note  Patient: Gary [name]    ZOX:[id number]  DOA: 02/05/2023     Date of Service: the patient was seen and examined on 02/15/2023  Chief Complaint  Patient presents with   Fall   Brief hospital course: Gary [name] is a 77 y.o. male with medical history significant of  recurrent stage III mantle cell lymphoma s/p 43 cycles of Rituxan and Treanda, depression, insulin-dependent diabetes mellitus, hypertension, hyperlipidemia, who presents to ED with increase falls over the last week at [facility]. Patient of note has interim history of admission 01/12/2023-01/25/23 at which time patient was treated for  Sepsis due to cellulitis, as well as AKI. Patient at that time had presented to ED with generalized weakness and hypotension.  Patient also has interim history of colonoscopy on 02/04/23 with results noting polyp descending colon/hemorrhoids otherwise normal :Patient currently states he was attempting to go to the bathroom and his leg gave out on him and he was not able to use his arms to hold himself up due to being weak. Sister also aides in history she is concerned about current rehab has has had 3 falls since being admitted on 4/15. She is also concerned about his lack of appetite. She also note him picking at the air when he sleeps which is new for him and she is concerned about medication side-effect. Patient notes no fever/chills/, but does have nausea , no current abdominal pain or chest pain.   ED Course:  Afeb, bp 121/58, HR78, rr 18 sat 100%  UA : LE Moderate, bacteria rare CTH/Cervical spine IMPRESSION: 1. Distraction injury at C6-C7, with fracture through the right anterior superior C7 endplate and widening of the anterior aspect of the disc space at C6-C7. Prevertebral fluid anterior to C4-T2. 2. No acute intracranial process. 3. Small right pleural effusion.   Cxr: IMPRESSION: Chronic interstitial changes consistent with fibrosis. No evidence of active pulmonary  disease.   Wbc 2.4, hgb 9.7 at baseline,plt 110 Inr 1.4  NA 136, K 4.2, glu 155,  Xray pelvis; Osteopenia. No displaced fractures are visualized. If there is high clinical concern for hip fracture recommend dedicated hip views.  EKG ventricular paced  UA + wbc , LE, urine culture negative Tx tylenol, ultram   5/3: Hemodynamically stable.  TOC is still looking for placement.  5/4: PO intake remain poor. Pending placement.  5/5: Patient was very somnolent, unable to keep him awake to have any communication. CT head and ABG ordered, and they were without any acute abnormality.  5/6: Patient little more awake but remained lethargic.  P.o. intake remained poor.  Discussed with palliative care from cancer center and they will see the patient either later today or tomorrow morning and discussed with sister.  According to them he is hospice appropriate.  Patient is high risk for deterioration and mortality based on life limiting underlying comorbidities and poor functional status.  Assessment and Plan:  # Fracture of C7 endplate s/p Fall due to orthostatic hypotension Prevertebral fluid anterior to C4-T2 -case discussed with neurosurgery Dr Welton Flakes who rec cervical collar and out patient follow up  -supportive pain regimen  -Fall due to orthostatic hypotension, and low volume from chronic diarrhea  4/30 patient blood pressure dropped from sitting to standing position, positive for orthostatic vital signs, unable to stand by himself, 2 people assist, while taking orthostatics. Continue fall precautions, ambulate with assistance, turn patient every 2 hours Continue aspiration precautions -PT/OT evaluate -recommending SNF SLP eval  done, started on dysphagia 2 diet with thin liquids   Altered Mental Status: Little more awake and denies any pain.  P.o. intake remained poor stating that he does not have an appetite.  CT head and ABG done yesterday was without any acute abnormality.   # Pyuria, UA  positive, urine culture insufficient growth.  SNF was ceftriaxone, discontinued antibiotics on 4/29.     # AKI, baseline creatinine 1.03 on 01/25/23 4/28 Creatinine 1.42--0.87 , improved Monitor renal functions daily Bladder scan 94, ruled out urinary retention Continue IV fluid for gentle hydration    # Chronic persistent hypotension, and orthostatic hypotension causing multiple falls Continued midodrine home dose Patient was advised to move slowly, continue fall precautions.   # Recurrent stage III mantle cell lymphoma  -s/p 43 cycles of Rituxan and Treanda -Last chemo was in January 2024 -pancytopenia, due to malignancy, stable, no fever. -followed by oncology as outpatient Dr Orlie Dakin  4/29 texted to Dr. Orlie Dakin for neutropenia, recommended no need of Zarzio at this time as patient is afebrile and no signs of infection. 5/6: Palliative care from cancer center to reevaluate patient and talk with the family.  He is hospice appropriate.  # Folic acid deficiency, folate level 3.3, started folic acid 1 mg p.o. daily # Anemia most likely secondary to chronic disease and underlying malignancy. Iron profile and B12 level within normal range Monitor H&H and transfuse if hemoglobin less than 7  # Chronic diarrhea  -began with chemo  -followed by GI  -recent c-scope 4/25 noted only polyp and hemorrhoids  -possible cause of patient falls due to orthostasis  4/30 no BM for past 2 days as per RN, seems send diarrhea improving.  Discontinued cholestyramine. Last BM on 5/2  # Depression, continue ssri    # Type 1 diabetes mellitus  Uncontrolled blood glucose could be due to lack of insulin use Diabetic coordinator consulted, Started Semglee 5 units daily and NovoLog sliding scale, monitor CBG Continue diabetic diet 4/28 CBGs low today, held [employer]   # Hyperlipidemia -continue statin   # Hypomagnesemia, mag repleted. Monitor electrolytes and replete as needed.   # Hypothyroid,  patient was on Synthroid 50 mcg p.o. daily TSH 5.8, elevated, could be due to acute illness. Continue Synthroid 50 mcg daily, repeat TSH level after 4 to 6 weeks.   # Right lower extremity chronic wound and bilateral lower extremity pigmentation and dry skin Wound care RN consulted Started Lotrisone cream BID  Body mass index is 29.69 kg/m.  Interventions:   Pressure Injury 01/12/23 Buttocks Right;Left Stage 2 -  Partial thickness loss of dermis presenting as a shallow open injury with a red, pink wound bed without slough. (Active)  01/12/23 2330  Location: Buttocks  Location Orientation: Right;Left  Staging: Stage 2 -  Partial thickness loss of dermis presenting as a shallow open injury with a red, pink wound bed without slough.  Wound Description (Comments):   Present on Admission: Yes  Dressing Type Foam - Lift dressing to assess site every shift 02/14/23 2109     Pressure Injury 01/14/23 Coccyx Bilateral Deep Tissue Pressure Injury - Purple or maroon localized area of discolored intact skin or blood-filled blister due to damage of underlying soft tissue from pressure and/or shear. 4 DTI areas noted (Active)  01/14/23 1751  Location: Coccyx  Location Orientation: Bilateral  Staging: Deep Tissue Pressure Injury - Purple or maroon localized area of discolored intact skin or blood-filled blister due to damage of underlying  soft tissue from pressure and/or shear.  Wound Description (Comments): 4 DTI areas noted  Present on Admission:   Dressing Type Foam - Lift dressing to assess site every shift 02/15/23 0800     Diet: Diabetic diet DVT Prophylaxis: Subcutaneous Heparin    Advance goals of care discussion: DNR  Family Communication:   Disposition:  Pt is from [facility], admitted with fall and C7 Fx and uncontrolled DM, still has C-collar. CBG is well controlled now, but has neutropenia, which precludes a safe discharge. Discharge to SNF, clinically stable, medically optimized.   Awaiting for placement to SNF. TOC consulted for discharge planning  Subjective: Patient was more awake and alert today.  Stating that he was very tired yesterday.  Appetite remained poor.  Denies any pain.  Physical Exam: General.  Chronically ill-appearing elderly man, in no acute distress.  Neck collar in place Pulmonary.  Lungs clear bilaterally, normal respiratory effort. CV.  Regular rate and rhythm, no JVD, rub or murmur. Abdomen.  Soft, nontender, nondistended, BS positive. CNS.  Alert and oriented .  No focal neurologic deficit. Extremities.  No edema, no cyanosis, pulses intact and symmetrical.   Vitals:   02/15/23 0049 02/15/23 0436 02/15/23 0818 02/15/23 1134  BP: (!) 120/54 (!) 118/58 (!) 131/55 (!) 121/92  Pulse: 77 72 66 78  Resp:  20 18 18   Temp: 97.9 F (36.6 C) 98.5 F (36.9 C) 97.8 F (36.6 C) 98.3 F (36.8 C)  TempSrc: Oral Oral    SpO2: 100% 100% 100% 100%  Weight:      Height:        Intake/Output Summary (Last 24 hours) at 02/15/2023 1528 Last data filed at 02/15/2023 1500 Gross per 24 hour  Intake 1221.25 ml  Output 200 ml  Net 1021.25 ml    Filed Weights   02/05/23 1905 02/06/23 1044  Weight: 96.6 kg 91.2 kg    Data Reviewed: I have personally reviewed and interpreted daily labs, tele strips, imagings as discussed above. I reviewed all nursing notes, pharmacy notes, vitals, pertinent old records I have discussed plan of care as described above with RN and patient/family.  CBC: Recent Labs  Lab 02/09/23 0615 02/10/23 0530 02/11/23 0600 02/15/23 0500  WBC 1.4* 1.6* 1.5* 1.6*  NEUTROABS 0.4* 0.3* 0.4*  --   HGB 8.3* 9.0* 8.4* 8.9*  HCT 27.1* 28.6* 27.3* 28.9*  MCV 97.1 96.3 96.8 97.6  PLT 88* 103* 102* 98*    Basic Metabolic Panel: Recent Labs  Lab 02/09/23 0615 02/10/23 0530 02/11/23 0600 02/15/23 0500  NA 139 139 140 141  K 4.4 4.2 4.1 4.0  CL 107 109 109 107  CO2 25 24 25 25   GLUCOSE 194* 181* 122* 164*  BUN 12 12 9 9    CREATININE 0.97 1.02 0.87 0.98  CALCIUM 7.3* 7.4* 7.6* 7.8*  MG 1.7  --   --   --   PHOS 2.8  --   --   --      Studies: CT HEAD WO CONTRAST ( )  Result Date: 02/14/2023 CLINICAL DATA:  Initial evaluation for mental status change, persistent or worsening. EXAM: CT HEAD WITHOUT CONTRAST TECHNIQUE: Contiguous axial images were obtained from the base of the skull through the vertex without intravenous contrast. RADIATION DOSE REDUCTION: This exam was performed according to the departmental dose-optimization program which includes automated exposure control, adjustment of the mA and/or kV according to patient size and/or use of iterative reconstruction technique. COMPARISON:  CT from 02/05/2023. FINDINGS:  Brain: Generalized age-related cerebral atrophy with chronic small vessel ischemic disease. No acute intracranial hemorrhage. No acute large vessel territory infarct. No mass lesion, midline shift or mass effect. No hydrocephalus or extra-axial fluid collection. Vascular: No abnormal hyperdense vessel. Calcified atherosclerosis present at the skull base. Skull: Evolving right posterior scalp contusion with laceration with skin staples in place. Calvarium intact. Sinuses/Orbits: Globes and orbital soft tissues demonstrate no acute finding. Paranasal sinuses are clear. No significant mastoid effusion. Other: None. IMPRESSION: 1. No acute intracranial abnormality. 2. Evolving right posterior scalp contusion with laceration. No calvarial fracture. 3. Generalized age-related cerebral atrophy with chronic small vessel ischemic disease. Electronically Signed   By: Rise Mu M.D.   On: 02/14/2023 19:44    Scheduled Meds:  calcium carbonate  1 tablet Oral BID WC   Chlorhexidine Gluconate Cloth  6 each Topical Daily   clotrimazole-betamethasone   Topical BID   feeding supplement (GLUCERNA SHAKE)  237 mL Oral TID BM   ferrous sulfate  325 mg Oral Q breakfast   FLUoxetine  20 mg Oral Daily   folic  acid  1 mg Oral Daily   heparin  5,000 Units Subcutaneous Q8H   insulin aspart  0-6 Units Subcutaneous Q4H   insulin glargine-yfgn  5 Units Subcutaneous Daily   lactobacillus  1 g Oral TID WC   levothyroxine  50 mcg Oral QAC breakfast   midodrine  10 mg Oral TID WC   pantoprazole  40 mg Oral BID AC   pravastatin  40 mg Oral QHS   sodium chloride flush  10-40 mL Intracatheter Q12H   Continuous Infusions:  lactated ringers Stopped (02/15/23 0523)    PRN Meds: acetaminophen, glucose, loperamide, morphine injection, ondansetron (ZOFRAN) IV, sodium chloride flush  Time spent: 40 minutes  This record has been created using Conservation officer, historic buildings. Errors have been sought and corrected,but may not always be located. Such creation errors do not reflect on the standard of care.   Author: Arnetha Courser. MD [facility] 02/15/2023 3:28 PM  To reach On-call, see care teams to locate the attending and reach out to them via [email or web address]. If 7PM-7AM, please contact night-coverage If you still have difficulty reaching the attending provider, please page the [facility] (Director on Call) for [facility] on amion for assistance.

## 2023-02-15 NOTE — Progress Notes (Signed)
       CROSS COVER NOTE  NAME: Gary [name] MRN: [id number] DOB : 04-Sep-1946 ATTENDING PHYSICIAN: Arnetha Courser, MD    Date of Service   02/15/2023   HPI/Events of Note   Report/Request "Pt has not had any urinary output tonight, he is not distended denies any discomfort in the super pubic area I just bladder scanned him the volume was . Wanted to make you aware. "   Interventions   Assessment/Plan: LR Bolus       To reach the provider On-Call:   7AM- 7PM see care teams to locate the attending and reach out to them via [email or web address]. Password: [email or web address] 7PM-7AM contact night-coverage If you still have difficulty reaching the appropriate provider, please page the [facility] (Director on Call) for [facility] on amion for assistance  This document was prepared using Conservation officer, historic buildings and may include unintentional dictation errors.  Bishop Limbo DNP, MBA, FNP-BC, PMHNP-BC Nurse Practitioner [facility] Pager [phone]

## 2023-02-15 NOTE — Care Management Important Message (Signed)
Important Message  Patient Details  Name: Gary [name] MRN: [id number] Date of Birth: 03/11/1946   Medicare Important Message Given:  Yes     [name] 02/15/2023, 1:16 PM

## 2023-02-15 NOTE — TOC Progression Note (Signed)
Transition of Care [facility]) - Progression Note    Patient Details  Name: Gary [name] MRN: [id number] Date of Birth: [birth year]  Transition of Care [facility] CM/SW Contact  Garret Reddish, RN Phone Number: 02/15/2023, 2:46 PM  Clinical Narrative:   I spoke with patient's sister [name].  She informs me that she would like for me to follow up with [facility].  She was able to tour this facility. She also toured [facility] in [place] and did not want to accept the bed offer at [facility].    Noted that patient become more somnolent and able to communicate very much over the weekend. Patient also refusing po intake and not voiding very much.  Noted order for CT of head.    TOC will continue to follow for discharge planning.      Expected Discharge Plan: Skilled Nursing Facility Barriers to Discharge: Continued Medical Work up  Expected Discharge Plan and Services In-house Referral: Clinical Social Work     Living arrangements for the past 2 months: Single Family Home                                       Social Determinants of Health (SDOH) Interventions SDOH Screenings   Food Insecurity: No Food Insecurity (01/12/2023)  Housing: Low Risk  (01/12/2023)  Transportation Needs: No Transportation Needs (01/12/2023)  Recent Concern: Transportation Needs - Unmet Transportation Needs (01/11/2023)  Utilities: Not At Risk (01/12/2023)  Alcohol Screen: Low Risk  (11/27/2020)  Depression (PHQ2-9): High Risk (01/12/2023)  Financial Resource Strain: Low Risk  (03/10/2022)  Physical Activity: Inactive (03/10/2022)  Social Connections: Socially Isolated (03/10/2022)  Stress: No Stress Concern Present (03/10/2022)  Tobacco Use: High Risk (02/05/2023)    Readmission Risk Interventions    01/15/2023   11:37 AM 08/30/2021   10:48 AM  Readmission Risk Prevention Plan  Transportation Screening Complete Complete  PCP or Specialist Appt within 3-5 Days Complete Complete  HRI or Home Care  Consult Complete Complete  Social Work Consult for Recovery Care Planning/Counseling Complete Complete  Palliative Care Screening Not Applicable Not Applicable  Medication Review Oceanographer) Complete Complete

## 2023-02-16 DIAGNOSIS — Z515 Encounter for palliative care: Secondary | ICD-10-CM

## 2023-02-16 DIAGNOSIS — S12691A Other nondisplaced fracture of seventh cervical vertebra, initial encounter for closed fracture: Secondary | ICD-10-CM | POA: Diagnosis not present

## 2023-02-16 DIAGNOSIS — R296 Repeated falls: Secondary | ICD-10-CM | POA: Diagnosis not present

## 2023-02-16 LAB — GLUCOSE, CAPILLARY
Glucose-Capillary: 162 mg/dL — ABNORMAL HIGH (ref 70–99)
Glucose-Capillary: 189 mg/dL — ABNORMAL HIGH (ref 70–99)
Glucose-Capillary: 206 mg/dL — ABNORMAL HIGH (ref 70–99)
Glucose-Capillary: 210 mg/dL — ABNORMAL HIGH (ref 70–99)
Glucose-Capillary: 216 mg/dL — ABNORMAL HIGH (ref 70–99)
Glucose-Capillary: 223 mg/dL — ABNORMAL HIGH (ref 70–99)
Glucose-Capillary: 231 mg/dL — ABNORMAL HIGH (ref 70–99)

## 2023-02-16 LAB — BASIC METABOLIC PANEL
Anion gap: 5 (ref 5–15)
BUN: 8 mg/dL (ref 8–23)
CO2: 28 mmol/L (ref 22–32)
Calcium: 7.7 mg/dL — ABNORMAL LOW (ref 8.9–10.3)
Chloride: 105 mmol/L (ref 98–111)
Creatinine, Ser: 0.88 mg/dL (ref 0.61–1.24)
GFR, Estimated: >60 mL/min (ref >60)
Glucose, Bld: 236 mg/dL — ABNORMAL HIGH (ref 70–99)
Potassium: 4.3 mmol/L (ref 3.5–5.1)
Sodium: 138 mmol/L (ref 135–145)

## 2023-02-16 LAB — MAGNESIUM: Magnesium: 1.5 mg/dL — ABNORMAL LOW (ref 1.7–2.4)

## 2023-02-16 MED ORDER — MUPIROCIN CALCIUM 2 % EX CREA
TOPICAL_CREAM | Freq: Two times a day (BID) | CUTANEOUS | Status: DC
  Filled 2023-02-16: qty 15

## 2023-02-16 MED ORDER — ENOXAPARIN SODIUM 40 MG/0.4ML IJ SOSY
40.0000 mg | PREFILLED_SYRINGE | INTRAMUSCULAR | Status: DC
  Administered 2023-02-16: 40 mg via SUBCUTANEOUS
  Filled 2023-02-16: qty 0.4

## 2023-02-16 MED ORDER — MAGNESIUM SULFATE 4 GM/100ML IV SOLN
4.0000 g | Freq: Once | INTRAVENOUS | Status: AC
  Administered 2023-02-16: 4 g via INTRAVENOUS
  Filled 2023-02-16: qty 100

## 2023-02-16 NOTE — Progress Notes (Signed)
[facility] Progress Note  Patient: Gary [name]    ZOX:[id number]  DOA: 02/05/2023     Date of Service: the patient was seen and examined on 02/16/2023  Chief Complaint  Patient presents with   Fall   Brief hospital course: Gary [name] is a 77 y.o. male with medical history significant of  recurrent stage III mantle cell lymphoma s/p 43 cycles of Rituxan and Treanda, depression, insulin-dependent diabetes mellitus, hypertension, hyperlipidemia, who presents to ED with increase falls over the last week at [facility]. Patient of note has interim history of admission 01/12/2023-01/25/23 at which time patient was treated for  Sepsis due to cellulitis, as well as AKI. Patient at that time had presented to ED with generalized weakness and hypotension.  Patient also has interim history of colonoscopy on 02/04/23 with results noting polyp descending colon/hemorrhoids otherwise normal :Patient currently states he was attempting to go to the bathroom and his leg gave out on him and he was not able to use his arms to hold himself up due to being weak. Sister also aides in history she is concerned about current rehab has has had 3 falls since being admitted on 4/15. She is also concerned about his lack of appetite. She also note him picking at the air when he sleeps which is new for him and she is concerned about medication side-effect. Patient notes no fever/chills/, but does have nausea , no current abdominal pain or chest pain.   ED Course:  Afeb, bp 121/58, HR78, rr 18 sat 100%  UA : LE Moderate, bacteria rare CTH/Cervical spine IMPRESSION: 1. Distraction injury at C6-C7, with fracture through the right anterior superior C7 endplate and widening of the anterior aspect of the disc space at C6-C7. Prevertebral fluid anterior to C4-T2. 2. No acute intracranial process. 3. Small right pleural effusion.   Cxr: IMPRESSION: Chronic interstitial changes consistent with fibrosis. No evidence of active pulmonary  disease.   Wbc 2.4, hgb 9.7 at baseline,plt 110 Inr 1.4  NA 136, K 4.2, glu 155,  Xray pelvis; Osteopenia. No displaced fractures are visualized. If there is high clinical concern for hip fracture recommend dedicated hip views.  EKG ventricular paced  UA + wbc , LE, urine culture negative Tx tylenol, ultram   5/3: Hemodynamically stable.  TOC is still looking for placement.  5/4: PO intake remain poor. Pending placement.  5/5: Patient was very somnolent, unable to keep him awake to have any communication. CT head and ABG ordered, and they were without any acute abnormality.  5/6: Patient little more awake but remained lethargic.  P.o. intake remained poor.  Discussed with palliative care from cancer center and they will see the patient either later today or tomorrow morning and discussed with sister.  According to them he is hospice appropriate.  5/7: Hemodynamically stable.  Another palliative care meeting today but patient would like to try rehab. Still awaiting for placement.  Patient is high risk for deterioration and mortality based on life limiting underlying comorbidities and poor functional status.  Assessment and Plan:  # Fracture of C7 endplate s/p Fall due to orthostatic hypotension Prevertebral fluid anterior to C4-T2 -case discussed with neurosurgery Dr Welton Flakes who rec cervical collar and out patient follow up  -supportive pain regimen  -Fall due to orthostatic hypotension, and low volume from chronic diarrhea  4/30 patient blood pressure dropped from sitting to standing position, positive for orthostatic vital signs, unable to stand by himself, 2 people assist, while taking  orthostatics. Continue fall precautions, ambulate with assistance, turn patient every 2 hours Continue aspiration precautions -PT/OT evaluate -recommending SNF SLP eval done, started on dysphagia 2 diet with thin liquids   Altered Mental Status: Improved Little more awake and denies any pain.  P.o.  intake remained poor stating that he does not have an appetite.  CT head and ABG was without any acute abnormality.   # Pyuria, UA positive, urine culture insufficient growth.  SNF was ceftriaxone, discontinued antibiotics on 4/29.     # AKI, baseline creatinine 1.03 on 01/25/23 4/28 Creatinine 1.42--0.87 , improved Monitor renal functions daily Bladder scan 94, ruled out urinary retention Continue IV fluid for gentle hydration    # Chronic persistent hypotension, and orthostatic hypotension causing multiple falls Continued midodrine home dose Patient was advised to move slowly, continue fall precautions.   # Recurrent stage III mantle cell lymphoma  -s/p 43 cycles of Rituxan and Treanda -Last chemo was in January 2024 -pancytopenia, due to malignancy, stable, no fever. -followed by oncology as outpatient Dr Orlie Dakin  4/29 texted to Dr. Orlie Dakin for neutropenia, recommended no need of Zarzio at this time as patient is afebrile and no signs of infection. 5/6: Palliative care from cancer center to reevaluate patient and talk with the family.  He is hospice appropriate.  # Folic acid deficiency, folate level 3.3, started folic acid 1 mg p.o. daily # Anemia most likely secondary to chronic disease and underlying malignancy. Iron profile and B12 level within normal range Monitor H&H and transfuse if hemoglobin less than 7  # Chronic diarrhea  -began with chemo  -followed by GI  -recent c-scope 4/25 noted only polyp and hemorrhoids  -possible cause of patient falls due to orthostasis  4/30 no BM for past 2 days as per RN, seems send diarrhea improving.  Discontinued cholestyramine. Last BM on 5/2  # Depression, continue ssri    # Type 1 diabetes mellitus  Uncontrolled blood glucose could be due to lack of insulin use Diabetic coordinator consulted, Started Semglee 5 units daily and NovoLog sliding scale, monitor CBG Continue diabetic diet 4/28 CBGs low today, held [employer]   #  Hyperlipidemia -continue statin   # Hypomagnesemia, mag repleted. Monitor electrolytes and replete as needed.   # Hypothyroid, patient was on Synthroid 50 mcg p.o. daily TSH 5.8, elevated, could be due to acute illness. Continue Synthroid 50 mcg daily, repeat TSH level after 4 to 6 weeks.   # Right lower extremity chronic wound and bilateral lower extremity pigmentation and dry skin Wound care RN consulted Started Lotrisone cream BID  Body mass index is 29.69 kg/m.  Interventions:   Pressure Injury 01/12/23 Buttocks Right;Left Stage 2 -  Partial thickness loss of dermis presenting as a shallow open injury with a red, pink wound bed without slough. (Active)  01/12/23 2330  Location: Buttocks  Location Orientation: Right;Left  Staging: Stage 2 -  Partial thickness loss of dermis presenting as a shallow open injury with a red, pink wound bed without slough.  Wound Description (Comments):   Present on Admission: Yes  Dressing Type Foam - Lift dressing to assess site every shift 02/16/23 1000     Pressure Injury 01/14/23 Coccyx Bilateral Deep Tissue Pressure Injury - Purple or maroon localized area of discolored intact skin or blood-filled blister due to damage of underlying soft tissue from pressure and/or shear. 4 DTI areas noted (Active)  01/14/23 1751  Location: Coccyx  Location Orientation: Bilateral  Staging: Deep Tissue  Pressure Injury - Purple or maroon localized area of discolored intact skin or blood-filled blister due to damage of underlying soft tissue from pressure and/or shear.  Wound Description (Comments): 4 DTI areas noted  Present on Admission:   Dressing Type Foam - Lift dressing to assess site every shift 02/16/23 1000     Diet: Diabetic diet DVT Prophylaxis: Subcutaneous Heparin    Advance goals of care discussion: DNR  Family Communication:   Disposition:  Pt is from [facility], admitted with fall and C7 Fx and uncontrolled DM, still has C-collar. CBG is well  controlled now, but has neutropenia, which precludes a safe discharge. Discharge to SNF, clinically stable, medically optimized.  Awaiting for placement to SNF. TOC consulted for discharge planning  Subjective: Patient was eating breakfast when seen today.  Stating that he ate little better today.  No new complaints.  Physical Exam: General.  Chronically ill-appearing elderly man, in no acute distress.  Neck collar in place Pulmonary.  Lungs clear bilaterally, normal respiratory effort. CV.  Regular rate and rhythm, no JVD, rub or murmur. Abdomen.  Soft, nontender, nondistended, BS positive. CNS.  Alert and oriented .  No focal neurologic deficit. Extremities.  Bilateral lower extremity wound seems healing, dressing was being changed today during rounds Psychiatry.  Judgment and insight appears normal.   Vitals:   02/16/23 0404 02/16/23 0700 02/16/23 1044 02/16/23 1100  BP: 124/64 123/82 126/78 123/82  Pulse: 94 83 97 83  Resp: 17 18    Temp: 98.6 F (37 C) 98.1 F (36.7 C)  98 F (36.7 C)  TempSrc: Oral Oral  Oral  SpO2: 100% 100%  100%  Weight:      Height:        Intake/Output Summary (Last 24 hours) at 02/16/2023 1520 Last data filed at 02/16/2023 1300 Gross per 24 hour  Intake 110 ml  Output 1050 ml  Net -940 ml    Filed Weights   02/05/23 1905 02/06/23 1044  Weight: 96.6 kg 91.2 kg    Data Reviewed: I have personally reviewed and interpreted daily labs, tele strips, imagings as discussed above. I reviewed all nursing notes, pharmacy notes, vitals, pertinent old records I have discussed plan of care as described above with RN and patient/family.  CBC: Recent Labs  Lab 02/10/23 0530 02/11/23 0600 02/15/23 0500  WBC 1.6* 1.5* 1.6*  NEUTROABS 0.3* 0.4*  --   HGB 9.0* 8.4* 8.9*  HCT 28.6* 27.3* 28.9*  MCV 96.3 96.8 97.6  PLT 103* 102* 98*    Basic Metabolic Panel: Recent Labs  Lab 02/10/23 0530 02/11/23 0600 02/15/23 0500 02/16/23 0530  NA 139 140 141  138  K 4.2 4.1 4.0 4.3  CL 109 109 107 105  CO2 24 25 25 28   GLUCOSE 181* 122* 164* 236*  BUN 12 9 9 8   CREATININE 1.02 0.87 0.98 0.88  CALCIUM 7.4* 7.6* 7.8* 7.7*  MG  --   --   --  1.5*     Studies: No results found.  Scheduled Meds:  calcium carbonate  1 tablet Oral BID WC   Chlorhexidine Gluconate Cloth  6 each Topical Daily   clotrimazole-betamethasone   Topical BID   enoxaparin (LOVENOX) injection  40 mg Subcutaneous Q24H   feeding supplement (GLUCERNA SHAKE)  237 mL Oral TID BM   ferrous sulfate  325 mg Oral Q breakfast   FLUoxetine  20 mg Oral Daily   folic acid  1 mg Oral Daily  insulin aspart  0-6 Units Subcutaneous Q4H   insulin glargine-yfgn  5 Units Subcutaneous Daily   lactobacillus  1 g Oral TID WC   levothyroxine  50 mcg Oral QAC breakfast   midodrine  10 mg Oral TID WC   pantoprazole  40 mg Oral BID AC   pravastatin  40 mg Oral QHS   sodium chloride flush  10-40 mL Intracatheter Q12H   Continuous Infusions:    PRN Meds: acetaminophen, glucose, loperamide, morphine injection, ondansetron (ZOFRAN) IV, sodium chloride flush  Time spent: 38 minutes  This record has been created using Conservation officer, historic buildings. Errors have been sought and corrected,but may not always be located. Such creation errors do not reflect on the standard of care.   Author: Arnetha Courser. MD [facility] 02/16/2023 3:20 PM  To reach On-call, see care teams to locate the attending and reach out to them via [email or web address]. If 7PM-7AM, please contact night-coverage If you still have difficulty reaching the attending provider, please page the [facility] (Director on Call) for [facility] on amion for assistance.

## 2023-02-16 NOTE — Progress Notes (Signed)
Occupational Therapy Treatment Patient Details Name: Gary [name] MRN: [id number] DOB: 03-04-1946 Today's Date: 02/16/2023   History of present illness Gary [name] is a 77 y.o. male with medical history significant of   recurrent stage III mantle cell lymphoma s/p 43 cycles of Rituxan and Treanda, depression, insulin-dependent diabetes mellitus, hypertension, hyperlipidemia, who presents to ED with increase falls over the last week at [facility] nursing home. Pt had been discharged to [facility]) after hospital admission in mid April 2024. Prior to recent hospitalization he was living at home alone. He has experienced multiple falls over last month. Pt also reports increased difficulty swallowing over last month. Cervical X-ray reveals Distraction injury at C6-C7, with fracture through the right  anterior superior C7 endplate and widening of the anterior aspect of  the disc space at C6-C7. Prevertebral fluid anterior to C4-T2. Neurosurgery Dr Welton Flakes recommends cervical collar and out patient follow up.   OT comments  Upon entering the room, RN present to give pt medications. RN requesting assistance with bed mobility for pt to be sitting up for medications. Pt needing max A for bed mobility to EOB. He sits with supervision overall and reports feeling well. Pt stands with mod A from slightly elevated bed height. Pt rocks for momentum and stands with mod lifting assistance. Mod - max A for balance as pt has posterior bias in standing. Pt then able to take several side steps to the R with RW with min -mod A for balance. Pt does fatigue quickly and returns to seated position. He is taking medications but then experiences a LE cramp and is returned to bed for comfort and repositioned in chair position. RN remains in room.    Recommendations for follow up therapy are one component of a multi-disciplinary discharge planning process, led by the attending physician.  Recommendations may be updated  based on patient status, additional functional criteria and insurance authorization.    Assistance Recommended at Discharge Frequent or constant Supervision/Assistance  Patient can return home with the following  Two people to help with walking and/or transfers;Assistance with cooking/housework;Direct supervision/assist for medications management;Direct supervision/assist for financial management;Assist for transportation;Help with stairs or ramp for entrance;A lot of help with bathing/dressing/bathroom   Equipment Recommendations  Other (comment) (defer to next venue of care)       Precautions / Restrictions Precautions Precautions: Fall;Cervical Precaution Comments: Cervical collar; C7 fracture       Mobility Bed Mobility Overal bed mobility: Needs Assistance Bed Mobility: Supine to Sit, Sit to Supine     Supine to sit: Max assist Sit to supine: Max assist        Transfers Overall transfer level: Needs assistance Equipment used: Rolling walker (2 wheels) Transfers: Sit to/from Stand Sit to Stand: Mod assist, From elevated surface           General transfer comment: posterior bias in standing     Balance Overall balance assessment: Needs assistance Sitting-balance support: No upper extremity supported, Feet supported Sitting balance-Leahy Scale: Fair Sitting balance - Comments: once pt is supported EOB, balance is fair.   Standing balance support: Bilateral upper extremity supported, During functional activity, Reliant on assistive device for balance Standing balance-Leahy Scale: Poor                             ADL either performed or assessed with clinical judgement   ADL   Eating/Feeding: Set up;Sitting  Extremity/Trunk Assessment Upper Extremity Assessment Upper Extremity Assessment: Generalized weakness   Lower Extremity Assessment Lower Extremity Assessment: Generalized weakness         Vision Patient Visual Report: No change from baseline            Cognition Arousal/Alertness: Awake/alert Behavior During Therapy: WFL for tasks assessed/performed Overall Cognitive Status: Within Functional Limits for tasks assessed                                                     Pertinent Vitals/ Pain       Pain Assessment Pain Assessment: Faces Faces Pain Scale: Hurts little more Pain Location: neck/back Pain Descriptors / Indicators: Sore, Grimacing, Guarding Pain Intervention(s): Monitored during session, Premedicated before session, Repositioned         Frequency  Min 1X/week        Progress Toward Goals  OT Goals(current goals can now be found in the care plan section)  Progress towards OT goals: Progressing toward goals     Plan Discharge plan remains appropriate;Frequency remains appropriate       AM-PAC OT "6 Clicks" Daily Activity     Outcome Measure   Help from another person eating meals?: A Little Help from another person taking care of personal grooming?: A Little Help from another person toileting, which includes using toliet, bedpan, or urinal?: A Lot Help from another person bathing (including washing, rinsing, drying)?: A Lot Help from another person to put on and taking off regular upper body clothing?: A Little Help from another person to put on and taking off regular lower body clothing?: A Lot 6 Click Score: 15    End of Session Equipment Utilized During Treatment: Rolling walker (2 wheels);Gait belt;Cervical collar  OT Visit Diagnosis: Unsteadiness on feet (R26.81);Repeated falls (R29.6)   Activity Tolerance Patient tolerated treatment well   Patient Left in bed;with call bell/phone within reach;with bed alarm set;with nursing/sitter in room   Nurse Communication Mobility status        Time: 6045-4098 OT Time Calculation (min): 19 min  Charges: OT General Charges $OT Visit: 1 Visit OT  Treatments $Therapeutic Activity: 8-22 mins  Jackquline Denmark, MS, OTR/L , CBIS ascom [phone]  02/16/23, 11:48 AM

## 2023-02-16 NOTE — Plan of Care (Signed)
  Problem: Fluid Volume: Goal: Hemodynamic stability will improve Outcome: Progressing   Problem: Clinical Measurements: Goal: Diagnostic test results will improve Outcome: Progressing Goal: Signs and symptoms of infection will decrease Outcome: Progressing   Problem: Respiratory: Goal: Ability to maintain adequate ventilation will improve Outcome: Progressing   Problem: Education: Goal: Ability to describe self-care measures that may prevent or decrease complications (Diabetes Survival Skills Education) will improve Outcome: Progressing Goal: Individualized Educational Video(s) Outcome: Progressing   Problem: Cardiac: Goal: Ability to maintain an adequate cardiac output will improve Outcome: Progressing   Problem: Health Behavior/Discharge Planning: Goal: Ability to identify and utilize available resources and services will improve Outcome: Progressing Goal: Ability to manage health-related needs will improve Outcome: Progressing   Problem: Fluid Volume: Goal: Ability to achieve a balanced intake and output will improve Outcome: Progressing   Problem: Metabolic: Goal: Ability to maintain appropriate glucose levels will improve Outcome: Progressing   Problem: Nutritional: Goal: Maintenance of adequate nutrition will improve Outcome: Progressing Goal: Maintenance of adequate weight for body size and type will improve Outcome: Progressing   Problem: Respiratory: Goal: Will regain and/or maintain adequate ventilation Outcome: Progressing   Problem: Urinary Elimination: Goal: Ability to achieve and maintain adequate renal perfusion and functioning will improve Outcome: Progressing   Problem: Education: Goal: Ability to describe self-care measures that may prevent or decrease complications (Diabetes Survival Skills Education) will improve Outcome: Progressing Goal: Individualized Educational Video(s) Outcome: Progressing   Problem: Coping: Goal: Ability to adjust  to condition or change in health will improve Outcome: Progressing   Problem: Fluid Volume: Goal: Ability to maintain a balanced intake and output will improve Outcome: Progressing   Problem: Health Behavior/Discharge Planning: Goal: Ability to identify and utilize available resources and services will improve Outcome: Progressing Goal: Ability to manage health-related needs will improve Outcome: Progressing   Problem: Metabolic: Goal: Ability to maintain appropriate glucose levels will improve Outcome: Progressing   Problem: Nutritional: Goal: Maintenance of adequate nutrition will improve Outcome: Progressing Goal: Progress toward achieving an optimal weight will improve Outcome: Progressing   Problem: Skin Integrity: Goal: Risk for impaired skin integrity will decrease Outcome: Progressing   Problem: Tissue Perfusion: Goal: Adequacy of tissue perfusion will improve Outcome: Progressing   Problem: Education: Goal: Knowledge of General Education information will improve Description: Including pain rating scale, medication(s)/side effects and non-pharmacologic comfort measures Outcome: Progressing   Problem: Health Behavior/Discharge Planning: Goal: Ability to manage health-related needs will improve Outcome: Progressing   Problem: Clinical Measurements: Goal: Ability to maintain clinical measurements within normal limits will improve Outcome: Progressing Goal: Will remain free from infection Outcome: Progressing Goal: Diagnostic test results will improve Outcome: Progressing Goal: Respiratory complications will improve Outcome: Progressing Goal: Cardiovascular complication will be avoided Outcome: Progressing   Problem: Activity: Goal: Risk for activity intolerance will decrease Outcome: Progressing   Problem: Nutrition: Goal: Adequate nutrition will be maintained Outcome: Progressing   Problem: Coping: Goal: Level of anxiety will decrease Outcome:  Progressing   Problem: Elimination: Goal: Will not experience complications related to bowel motility Outcome: Progressing Goal: Will not experience complications related to urinary retention Outcome: Progressing   Problem: Pain Managment: Goal: General experience of comfort will improve Outcome: Progressing   Problem: Safety: Goal: Ability to remain free from injury will improve Outcome: Progressing   Problem: Skin Integrity: Goal: Risk for impaired skin integrity will decrease Outcome: Progressing   

## 2023-02-16 NOTE — Progress Notes (Signed)
Physical Therapy Treatment Patient Details Name: Gary [name] MRN: [id number] DOB: 04/01/1946 Today's Date: 02/16/2023   History of Present Illness Gary [name] is a 77 y.o. male with medical history significant of   recurrent stage III mantle cell lymphoma s/p 43 cycles of Rituxan and Treanda, depression, insulin-dependent diabetes mellitus, hypertension, hyperlipidemia, who presents to ED with increase falls over the last week at [facility] nursing home. Pt had been discharged to [facility]) after hospital admission in mid April 2024. Prior to recent hospitalization he was living at home alone. He has experienced multiple falls over last month. Pt also reports increased difficulty swallowing over last month. Cervical X-ray reveals Distraction injury at C6-C7, with fracture through the right  anterior superior C7 endplate and widening of the anterior aspect of  the disc space at C6-C7. Prevertebral fluid anterior to C4-T2. Neurosurgery Dr Welton Flakes recommends cervical collar and out patient follow up.    PT Comments    Pt participated with OT this AM and agrees to standing again this pm.  To EOB with mod a x 1.  Steady in sitting EOB x 25 minutes today for seated AROM, conversation and standing x 2 to RW with mod a x 1.  He is able to take several small hesitant steps to left today.  He stated he feels more unsteady stepping left than right. He does have once incident where he feels as he is falling forward and needs assist to correct.    Overall increased tolerance, decreased pain and more awake and engaged today.  While sitting it is noticed he has 4 staples on the top/post crown of his head that seems well healed.  Discussed with MD and RN as pt has been here for 10 days and he feels they were put in while he was in ED.    Recommendations for follow up therapy are one component of a multi-disciplinary discharge planning process, led by the attending physician.  Recommendations may be  updated based on patient status, additional functional criteria and insurance authorization.  Follow Up Recommendations       Assistance Recommended at Discharge Frequent or constant Supervision/Assistance  Patient can return home with the following A lot of help with bathing/dressing/bathroom;Assistance with cooking/housework;Assist for transportation;Help with stairs or ramp for entrance;Direct supervision/assist for medications management;Direct supervision/assist for financial management;A lot of help with walking and/or transfers   Equipment Recommendations       Recommendations for Other Services       Precautions / Restrictions Precautions Precautions: Fall;Cervical Precaution Comments: Cervical collar; C7 fracture Restrictions Weight Bearing Restrictions: No     Mobility  Bed Mobility Overal bed mobility: Needs Assistance Bed Mobility: Supine to Sit, Sit to Supine     Supine to sit: Mod assist Sit to supine: Mod assist   General bed mobility comments: less pain noted today Patient Response: Cooperative  Transfers Overall transfer level: Needs assistance Equipment used: Rolling walker (2 wheels) Transfers: Sit to/from Stand Sit to Stand: Mod assist, From elevated surface                Ambulation/Gait Ambulation/Gait assistance: Min assist Gait Distance (Feet): 2 Feet Assistive device: Rolling walker (2 wheels) Gait Pattern/deviations: Step-to pattern Gait velocity: decreased     General Gait Details: small steps along bed to left.   Stairs             Wheelchair Mobility    Modified Rankin (Stroke Patients Only)  Balance Overall balance assessment: Needs assistance Sitting-balance support: No upper extremity supported, Feet supported Sitting balance-Leahy Scale: Good Sitting balance - Comments: steady once sitting, static   Standing balance support: Bilateral upper extremity supported, During functional activity, Reliant on  assistive device for balance Standing balance-Leahy Scale: Poor Standing balance comment: +1 hands on assist, remains increased fall risk                            Cognition Arousal/Alertness: Awake/alert Behavior During Therapy: WFL for tasks assessed/performed Overall Cognitive Status: Within Functional Limits for tasks assessed                                 General Comments: more alert and conversive today laughing durin gsession        Exercises Other Exercises Other Exercises: seated AROM EOB and remains sitting for about 25 minutes today before fatigue    General Comments        Pertinent Vitals/Pain Pain Assessment Pain Assessment: Faces Faces Pain Scale: Hurts a little bit Pain Location: neck/back Pain Descriptors / Indicators: Sore, Grimacing, Guarding Pain Intervention(s): Limited activity within patient's tolerance, Monitored during session, Repositioned    Home Living                          Prior Function            PT Goals (current goals can now be found in the care plan section) Progress towards PT goals: Progressing toward goals    Frequency    7X/week      PT Plan Current plan remains appropriate    Co-evaluation              AM-PAC PT "6 Clicks" Mobility   Outcome Measure  Help needed turning from your back to your side while in a flat bed without using bedrails?: A Lot Help needed moving from lying on your back to sitting on the side of a flat bed without using bedrails?: A Lot Help needed moving to and from a bed to a chair (including a wheelchair)?: A Lot Help needed standing up from a chair using your arms (e.g., wheelchair or bedside chair)?: A Lot Help needed to walk in hospital room?: A Lot Help needed climbing 3-5 steps with a railing? : Total 6 Click Score: 11    End of Session Equipment Utilized During Treatment: Gait belt;Cervical collar Activity Tolerance: Patient tolerated  treatment well Patient left: in bed;with call bell/phone within reach;with bed alarm set Nurse Communication: Mobility status PT Visit Diagnosis: Muscle weakness (generalized) (M62.81);Difficulty in walking, not elsewhere classified (R26.2);Unsteadiness on feet (R26.81);Repeated falls (R29.6)     Time: 1610-9604 PT Time Calculation (min) (ACUTE ONLY): 23 min  Charges:  $Therapeutic Exercise: 8-22 mins $Therapeutic Activity: 8-22 mins                    Danielle Dess, PTA 02/16/23, 2:56 PM

## 2023-02-16 NOTE — Progress Notes (Signed)
Palliative Care Progress Note, Assessment & Plan   Patient Name: Gary [name]       Date: 02/16/2023 DOB: 1946/06/24  Age: 77 y.o. MRN#: [id number] Attending Physician: Arnetha Courser, MD Primary Care Physician: Dale Frostburg, MD Admit Date: 02/05/2023  Subjective: Patient is lying in bed in no apparent distress.  He is able to acknowledge my presence and make his wishes known.  He has no acute complaints at this time.  He endorses he has "messed myself" and nurse techs come to bedside to assist in changing him.  No family or friends present at bedside during my visit.  HPI: 77 y.o. male  with past medical history of recurrent stage III mantle cell lymphoma last chemo treatment 11/06/2022 with last PET scan 11/2022 showing significant interval response to therapy, DM1,CHF, HTN, HLD  admitted from [facility] on 02/05/2023 with frequent falls.   Unwitnessed fall 4/26 with head trauma with associated neck pain   CTH/Cervical spine IMPRESSION: 1. Distraction injury at C6-C7, with fracture through the right anterior superior C7 endplate and widening of the anterior aspect of the disc space at C6-C7. Prevertebral fluid anterior to C4-T2. 2. No acute intracranial process. 3. Small right pleural effusion.   Noted recent hospitalization 01/12/2023-01/25/2023 treated for sepsis secondary to LE cellulitis as well as AKI    Ongoing issue with diarrhea noted since January-not felt to be related to chemo treatments, continued on Questran and as needed Imodium  Has been seen by GI as outpatient Colonoscopy 02/04/2023-findings of polyps in ascending colon otherwise normal  Summary of counseling/coordination of care: At Dr. Shanda Bumps request, I met with patient at bedside again to discuss plan and goals of care.     After reviewing the patient's chart and assessing the patient at bedside, I spoke with patient in regards to palliative needs as well as plan and goals of care.    Patient states he is terrified of going to rehab but is not sure what other options he has.  We discussed rehab vs home vs LTC.  He shares he would be interested in LTC but does not have the financial means to do this. I assured him TOC is following closely for discharge planning.   We discussed patient's functional, nutritional, and cognitive status is important indicators of patient's overall prognosis.  We discussed his lymphoma as a chronic illness that is contributing to his overall poor prognosis.  Patient begins to speak about his previous hospitalization and being discharged to rehab.  Multiple attempts made to redirect patient to discuss current medical situation without success. During our discussion, patient said he wants to continue to try to get stronger.   Therapeutic silence and active listening provided for patient to share his thoughts and emotions regarding previous admissions and rehab experiences.  Emotional support provided.  Goals and boundaries remain established. Please see DNR and MOST for further details of patient's wishes.   Symptom burden is low. Patient has no acute palliative issues today.   Disposition remains barrier at this time. TOC following closely.    PMT will continue to follow peripherally.   Physical Exam Vitals reviewed.  Constitutional:      General: He is  not in acute distress.    Appearance: He is not ill-appearing.  HENT:     Head: Normocephalic.     Comments: C-collar in place    Mouth/Throat:     Mouth: Mucous membranes are moist.  Eyes:     Pupils: Pupils are equal, round, and reactive to light.  Cardiovascular:     Rate and Rhythm: Normal rate.  Pulmonary:     Effort: Pulmonary effort is normal.  Abdominal:     Palpations: Abdomen is soft.  Musculoskeletal:      Comments: Generalized weakness  Skin:    General: Skin is warm and dry.  Neurological:     Mental Status: He is alert and oriented to person, place, and time.  Psychiatric:        Mood and Affect: Mood normal.        Behavior: Behavior normal.        Judgment: Judgment normal.             Total Time 35 minutes   Aviannah Castoro L. Manon Hilding, FNP-BC Palliative Medicine Team Team Phone # [phone]

## 2023-02-17 ENCOUNTER — Other Ambulatory Visit: Payer: Self-pay | Admitting: Hospice and Palliative Medicine

## 2023-02-17 DIAGNOSIS — R6521 Severe sepsis with septic shock: Secondary | ICD-10-CM | POA: Diagnosis not present

## 2023-02-17 DIAGNOSIS — C8318 Mantle cell lymphoma, lymph nodes of multiple sites: Secondary | ICD-10-CM

## 2023-02-17 DIAGNOSIS — K529 Noninfective gastroenteritis and colitis, unspecified: Secondary | ICD-10-CM | POA: Diagnosis not present

## 2023-02-17 DIAGNOSIS — R4182 Altered mental status, unspecified: Secondary | ICD-10-CM | POA: Diagnosis not present

## 2023-02-17 DIAGNOSIS — S12600A Unspecified displaced fracture of seventh cervical vertebra, initial encounter for closed fracture: Secondary | ICD-10-CM | POA: Diagnosis not present

## 2023-02-17 DIAGNOSIS — R531 Weakness: Secondary | ICD-10-CM | POA: Diagnosis not present

## 2023-02-17 DIAGNOSIS — R499 Unspecified voice and resonance disorder: Secondary | ICD-10-CM | POA: Diagnosis not present

## 2023-02-17 DIAGNOSIS — E872 Acidosis, unspecified: Secondary | ICD-10-CM | POA: Diagnosis not present

## 2023-02-17 DIAGNOSIS — I83029 Varicose veins of left lower extremity with ulcer of unspecified site: Secondary | ICD-10-CM | POA: Diagnosis not present

## 2023-02-17 DIAGNOSIS — R490 Dysphonia: Secondary | ICD-10-CM | POA: Diagnosis not present

## 2023-02-17 DIAGNOSIS — M8580 Other specified disorders of bone density and structure, unspecified site: Secondary | ICD-10-CM | POA: Diagnosis not present

## 2023-02-17 DIAGNOSIS — R2681 Unsteadiness on feet: Secondary | ICD-10-CM | POA: Diagnosis not present

## 2023-02-17 DIAGNOSIS — I7389 Other specified peripheral vascular diseases: Secondary | ICD-10-CM | POA: Diagnosis not present

## 2023-02-17 DIAGNOSIS — R579 Shock, unspecified: Secondary | ICD-10-CM | POA: Diagnosis not present

## 2023-02-17 DIAGNOSIS — E109 Type 1 diabetes mellitus without complications: Secondary | ICD-10-CM | POA: Diagnosis not present

## 2023-02-17 DIAGNOSIS — I10 Essential (primary) hypertension: Secondary | ICD-10-CM | POA: Diagnosis not present

## 2023-02-17 DIAGNOSIS — M47812 Spondylosis without myelopathy or radiculopathy, cervical region: Secondary | ICD-10-CM | POA: Diagnosis not present

## 2023-02-17 DIAGNOSIS — E785 Hyperlipidemia, unspecified: Secondary | ICD-10-CM | POA: Diagnosis not present

## 2023-02-17 DIAGNOSIS — R739 Hyperglycemia, unspecified: Secondary | ICD-10-CM | POA: Diagnosis not present

## 2023-02-17 DIAGNOSIS — I639 Cerebral infarction, unspecified: Secondary | ICD-10-CM | POA: Diagnosis not present

## 2023-02-17 DIAGNOSIS — C8315 Mantle cell lymphoma, lymph nodes of inguinal region and lower limb: Secondary | ICD-10-CM | POA: Diagnosis not present

## 2023-02-17 DIAGNOSIS — R571 Hypovolemic shock: Secondary | ICD-10-CM | POA: Diagnosis not present

## 2023-02-17 DIAGNOSIS — Z1152 Encounter for screening for COVID-19: Secondary | ICD-10-CM | POA: Diagnosis not present

## 2023-02-17 DIAGNOSIS — R1312 Dysphagia, oropharyngeal phase: Secondary | ICD-10-CM | POA: Diagnosis not present

## 2023-02-17 DIAGNOSIS — S0101XA Laceration without foreign body of scalp, initial encounter: Secondary | ICD-10-CM | POA: Diagnosis not present

## 2023-02-17 DIAGNOSIS — A419 Sepsis, unspecified organism: Secondary | ICD-10-CM | POA: Diagnosis not present

## 2023-02-17 DIAGNOSIS — C8310 Mantle cell lymphoma, unspecified site: Secondary | ICD-10-CM

## 2023-02-17 DIAGNOSIS — K649 Unspecified hemorrhoids: Secondary | ICD-10-CM | POA: Diagnosis not present

## 2023-02-17 DIAGNOSIS — I83019 Varicose veins of right lower extremity with ulcer of unspecified site: Secondary | ICD-10-CM | POA: Diagnosis not present

## 2023-02-17 DIAGNOSIS — R918 Other nonspecific abnormal finding of lung field: Secondary | ICD-10-CM | POA: Diagnosis not present

## 2023-02-17 DIAGNOSIS — R402431 Glasgow coma scale score 3-8, in the field [EMT or ambulance]: Secondary | ICD-10-CM | POA: Diagnosis not present

## 2023-02-17 DIAGNOSIS — Z741 Need for assistance with personal care: Secondary | ICD-10-CM | POA: Diagnosis not present

## 2023-02-17 DIAGNOSIS — F32A Depression, unspecified: Secondary | ICD-10-CM | POA: Diagnosis not present

## 2023-02-17 DIAGNOSIS — I5022 Chronic systolic (congestive) heart failure: Secondary | ICD-10-CM | POA: Diagnosis not present

## 2023-02-17 DIAGNOSIS — I951 Orthostatic hypotension: Secondary | ICD-10-CM | POA: Diagnosis not present

## 2023-02-17 DIAGNOSIS — D649 Anemia, unspecified: Secondary | ICD-10-CM | POA: Diagnosis not present

## 2023-02-17 DIAGNOSIS — D61818 Other pancytopenia: Secondary | ICD-10-CM | POA: Diagnosis not present

## 2023-02-17 DIAGNOSIS — Z794 Long term (current) use of insulin: Secondary | ICD-10-CM | POA: Diagnosis not present

## 2023-02-17 DIAGNOSIS — I959 Hypotension, unspecified: Secondary | ICD-10-CM | POA: Diagnosis not present

## 2023-02-17 DIAGNOSIS — K7200 Acute and subacute hepatic failure without coma: Secondary | ICD-10-CM | POA: Diagnosis not present

## 2023-02-17 DIAGNOSIS — E1110 Type 2 diabetes mellitus with ketoacidosis without coma: Secondary | ICD-10-CM | POA: Diagnosis not present

## 2023-02-17 DIAGNOSIS — R296 Repeated falls: Secondary | ICD-10-CM | POA: Diagnosis not present

## 2023-02-17 DIAGNOSIS — L97911 Non-pressure chronic ulcer of unspecified part of right lower leg limited to breakdown of skin: Secondary | ICD-10-CM | POA: Diagnosis not present

## 2023-02-17 DIAGNOSIS — L97921 Non-pressure chronic ulcer of unspecified part of left lower leg limited to breakdown of skin: Secondary | ICD-10-CM | POA: Diagnosis not present

## 2023-02-17 DIAGNOSIS — N179 Acute kidney failure, unspecified: Secondary | ICD-10-CM | POA: Diagnosis not present

## 2023-02-17 DIAGNOSIS — R29712 NIHSS score 12: Secondary | ICD-10-CM | POA: Diagnosis not present

## 2023-02-17 DIAGNOSIS — M6281 Muscle weakness (generalized): Secondary | ICD-10-CM | POA: Diagnosis not present

## 2023-02-17 DIAGNOSIS — R262 Difficulty in walking, not elsewhere classified: Secondary | ICD-10-CM | POA: Diagnosis not present

## 2023-02-17 DIAGNOSIS — D693 Immune thrombocytopenic purpura: Secondary | ICD-10-CM | POA: Diagnosis not present

## 2023-02-17 DIAGNOSIS — G928 Other toxic encephalopathy: Secondary | ICD-10-CM | POA: Diagnosis not present

## 2023-02-17 DIAGNOSIS — E039 Hypothyroidism, unspecified: Secondary | ICD-10-CM | POA: Diagnosis not present

## 2023-02-17 DIAGNOSIS — Z66 Do not resuscitate: Secondary | ICD-10-CM | POA: Diagnosis not present

## 2023-02-17 DIAGNOSIS — E11649 Type 2 diabetes mellitus with hypoglycemia without coma: Secondary | ICD-10-CM | POA: Diagnosis not present

## 2023-02-17 DIAGNOSIS — K7682 Hepatic encephalopathy: Secondary | ICD-10-CM | POA: Diagnosis not present

## 2023-02-17 DIAGNOSIS — I110 Hypertensive heart disease with heart failure: Secondary | ICD-10-CM | POA: Diagnosis not present

## 2023-02-17 DIAGNOSIS — R404 Transient alteration of awareness: Secondary | ICD-10-CM | POA: Diagnosis not present

## 2023-02-17 DIAGNOSIS — W19XXXA Unspecified fall, initial encounter: Secondary | ICD-10-CM | POA: Diagnosis not present

## 2023-02-17 DIAGNOSIS — W19XXXD Unspecified fall, subsequent encounter: Secondary | ICD-10-CM | POA: Diagnosis not present

## 2023-02-17 DIAGNOSIS — N3289 Other specified disorders of bladder: Secondary | ICD-10-CM | POA: Diagnosis not present

## 2023-02-17 DIAGNOSIS — R69 Illness, unspecified: Secondary | ICD-10-CM | POA: Diagnosis not present

## 2023-02-17 DIAGNOSIS — S12600D Unspecified displaced fracture of seventh cervical vertebra, subsequent encounter for fracture with routine healing: Secondary | ICD-10-CM | POA: Diagnosis not present

## 2023-02-17 DIAGNOSIS — Z20822 Contact with and (suspected) exposure to covid-19: Secondary | ICD-10-CM | POA: Diagnosis not present

## 2023-02-17 DIAGNOSIS — D529 Folate deficiency anemia, unspecified: Secondary | ICD-10-CM | POA: Diagnosis not present

## 2023-02-17 DIAGNOSIS — G9340 Encephalopathy, unspecified: Secondary | ICD-10-CM | POA: Diagnosis not present

## 2023-02-17 DIAGNOSIS — E1065 Type 1 diabetes mellitus with hyperglycemia: Secondary | ICD-10-CM | POA: Diagnosis not present

## 2023-02-17 LAB — BASIC METABOLIC PANEL
Anion gap: 3 — ABNORMAL LOW (ref 5–15)
BUN: 7 mg/dL — ABNORMAL LOW (ref 8–23)
CO2: 31 mmol/L (ref 22–32)
Calcium: 7.6 mg/dL — ABNORMAL LOW (ref 8.9–10.3)
Chloride: 104 mmol/L (ref 98–111)
Creatinine, Ser: 0.81 mg/dL (ref 0.61–1.24)
GFR, Estimated: >60 mL/min (ref >60)
Glucose, Bld: 146 mg/dL — ABNORMAL HIGH (ref 70–99)
Potassium: 4 mmol/L (ref 3.5–5.1)
Sodium: 138 mmol/L (ref 135–145)

## 2023-02-17 LAB — GLUCOSE, CAPILLARY
Glucose-Capillary: 146 mg/dL — ABNORMAL HIGH (ref 70–99)
Glucose-Capillary: 140 mg/dL — ABNORMAL HIGH (ref 70–99)

## 2023-02-17 LAB — MAGNESIUM: Magnesium: 2 mg/dL (ref 1.7–2.4)

## 2023-02-17 MED ORDER — FOLIC ACID 1 MG PO TABS: 1.0000 mg | ORAL_TABLET | Freq: Every day | ORAL | Status: DC

## 2023-02-17 MED ORDER — TRAMADOL HCL 50 MG PO TABS: 50.0000 mg | ORAL_TABLET | Freq: Two times a day (BID) | ORAL | 0 refills | Status: DC | PRN

## 2023-02-17 MED ORDER — CLOTRIMAZOLE-BETAMETHASONE 1-0.05 % EX CREA: TOPICAL_CREAM | Freq: Two times a day (BID) | CUTANEOUS | 0 refills | Status: DC

## 2023-02-17 MED ORDER — HEPARIN SOD (PORK) LOCK FLUSH 100 UNIT/ML IV SOLN
500.0000 [IU] | Freq: Once | INTRAVENOUS | Status: AC
  Administered 2023-02-17: 500 [IU] via INTRAVENOUS
  Filled 2023-02-17: qty 5

## 2023-02-17 MED ORDER — TRAMADOL HCL 50 MG PO TABS: 50.0000 mg | ORAL_TABLET | Freq: Two times a day (BID) | ORAL | 0 refills | Status: AC | PRN

## 2023-02-17 NOTE — TOC Transition Note (Signed)
Transition of Care [facility]) - CM/SW Discharge Note   Patient Details  Name: Gary [name] MRN: [id number] Date of Birth: 10-02-1946  [facility] CM/SW Contact:  Garret Reddish, RN Phone Number: 02/17/2023, 12:07 PM   Clinical Narrative:   Chart reviewed.  I have spoken with Dr. Sherryll Burger and he reports that patient is stable for discharge today.  I have spoken with patient and his sister [name] and they have accepted bed at [facility].  I have spoken with Cala Bradford, Admission Coordinator with [facility] she reports that she will have a bed for the patient today.  She informs me that patient will be going to room 407 and the number to call report is [phone]. I have faxed Cala Bradford, Admissions Coordinator with [facility] patient's Discharge Summary, SNF packet, and Discharge orders.    I have arranged ambulance transport with [facility].    I have informed staff nurse of the above information.        Final next level of care: Skilled Nursing Facility Barriers to Discharge: No Barriers Identified   Patient Goals and CMS Choice   Choice offered to / list presented to : Patient, Sibling  Discharge Placement                Patient chooses bed at:  ([facility])   Name of family member notified: Tonye Royalty Patient and family notified of of transfer: 02/17/23  Discharge Plan and Services Additional resources added to the After Visit Summary for   In-house Referral: Clinical Social Work                                   Social Determinants of Health (SDOH) Interventions SDOH Screenings   Food Insecurity: No Food Insecurity (01/12/2023)  Housing: Low Risk  (01/12/2023)  Transportation Needs: No Transportation Needs (01/12/2023)  Recent Concern: Transportation Needs - Unmet Transportation Needs (01/11/2023)  Utilities: Not At Risk (01/12/2023)  Alcohol Screen: Low Risk  (11/27/2020)  Depression (PHQ2-9): High  Risk (01/12/2023)  Financial Resource Strain: Low Risk  (03/10/2022)  Physical Activity: Inactive (03/10/2022)  Social Connections: Socially Isolated (03/10/2022)  Stress: No Stress Concern Present (03/10/2022)  Tobacco Use: High Risk (02/05/2023)     Readmission Risk Interventions    01/15/2023   11:37 AM 08/30/2021   10:48 AM  Readmission Risk Prevention Plan  Transportation Screening Complete Complete  PCP or Specialist Appt within 3-5 Days Complete Complete  HRI or Home Care Consult Complete Complete  Social Work Consult for Recovery Care Planning/Counseling Complete Complete  Palliative Care Screening Not Applicable Not Applicable  Medication Review Oceanographer) Complete Complete

## 2023-02-17 NOTE — Progress Notes (Signed)
Referral to palliative care at SNF.

## 2023-02-17 NOTE — Discharge Summary (Signed)
Physician Discharge Summary   Patient: Gary [name] MRN: [id number] DOB: April 11, 1946  Admit date:     02/05/2023  Discharge date: 02/17/23  Discharge Physician: Delfino Lovett   PCP: Dale [place], MD   Recommendations at discharge:   Follow-up with outpatient providers as requested  Discharge Diagnoses: Hospital Course: Assessment and Plan:  Gary [name] is a 77 y.o. male with medical history significant of recurrent stage III mantle cell lymphoma s/p 43 cycles of Rituxan and Treanda, depression, insulin-dependent diabetes mellitus, hypertension, hyperlipidemia, who presents to ED with increase falls over the last week at [name]. Patient of note has interim history of admission 01/12/2023-01/25/23 at which time patient was treated for  Sepsis due to cellulitis, as well as AKI. Patient at that time had presented to ED with generalized weakness and hypotension.  Patient also has interim history of colonoscopy on 02/04/23 with results noting polyp descending colon/hemorrhoids otherwise normal :Patient currently states he was attempting to go to the bathroom and his leg gave out on him and he was not able to use his arms to hold himself up due to being weak. Sister also aides in history she is concerned about current rehab has has had 3 falls since being admitted on 4/15. She is also concerned about his lack of appetite. She also note him picking at the air when he sleeps which is new for him and she is concerned about medication side-effect. Patient notes no fever/chills/, but does have nausea , no current abdominal pain or chest pain.   ED Course:  Afeb, bp 121/58, HR78, rr 18 sat 100%  UA : LE Moderate, bacteria rare CTH/Cervical spine IMPRESSION: 1. Distraction injury at C6-C7, with fracture through the right anterior superior C7 endplate and widening of the anterior aspect of the disc space at C6-C7. Prevertebral fluid anterior to C4-T2. 2. No acute intracranial process. 3. Small right pleural  effusion.   Cxr: IMPRESSION: Chronic interstitial changes consistent with fibrosis. No evidence of active pulmonary disease.   Wbc 2.4, hgb 9.7 at baseline,plt 110 Inr 1.4  NA 136, K 4.2, glu 155,  Xray pelvis; Osteopenia. No displaced fractures are visualized. If there is high clinical concern for hip fracture recommend dedicated hip views.  EKG ventricular paced  UA + wbc , LE, urine culture negative Tx tylenol, ultram    5/3: Hemodynamically stable.  TOC is still looking for placement.   5/4: PO intake remain poor. Pending placement.   5/5: Patient was very somnolent, unable to keep him awake to have any communication. CT head and ABG ordered, and they were without any acute abnormality.   5/6: Patient little more awake but remained lethargic.  P.o. intake remained poor.  Discussed with palliative care from cancer center and they will see the patient either later today or tomorrow morning and discussed with sister.  According to them he is hospice appropriate.   5/7: Hemodynamically stable.  Another palliative care meeting today but patient would like to try rehab. Still awaiting for placement.  5/8: Patient does have bed available at [place] [facility] where family is in agreement including patient for discharge   Patient is high risk for deterioration and mortality based on life limiting underlying comorbidities and poor functional status.  He is also at high risk for readmissions.   Assessment and Plan:   # Fracture of C7 endplate s/p Fall due to orthostatic hypotension - neurosurgery recommends cervical collar and out patient follow up  -supportive pain regimen -  recommend avoiding narcotics.  Will do short course of tramadol if and as need -Fall due to orthostatic hypotension, and low volume from chronic diarrhea  4/30 patient blood pressure dropped from sitting to standing position, positive for orthostatic vital signs, unable to stand by himself, 2 people assist, while  taking orthostatics. Continue fall precautions, ambulate with assistance, turn patient every 2 hours Continue aspiration precautions -PT/OT evaluate -recommending SNF SLP eval done, continue dysphagia 2 diet with thin liquids    Altered Mental Status: Improved and at baseline Little more awake and denies any pain.  P.o. intake remained poor stating that he does not have an appetite.  CT head and ABG was without any acute abnormality.   # Pyuria, UA positive, urine culture insufficient growth.  SNF was ceftriaxone, discontinued antibiotics on 4/29.     # AKI, baseline creatinine 1.03 on 01/25/23 4/28 Creatinine 1.42--0.87 , improved   # Chronic persistent hypotension, and orthostatic hypotension causing multiple falls Continued midodrine home dose Patient was advised to move slowly, continue fall precautions.   # Recurrent stage III mantle cell lymphoma  -s/p 43 cycles of Rituxan and Treanda -Last chemo was in January 2024 -pancytopenia, due to malignancy, stable, no fever. -followed by oncology as outpatient Dr Orlie Dakin  He is hospice appropriate but family is not agreeable yet.   # Folic acid deficiency, folate level 3.3, started folic acid 1 mg p.o. daily  # Anemia most likely secondary to chronic disease and underlying malignancy. Iron profile and B12 level within normal range   # Chronic diarrhea  -began with chemo  -followed by GI  -recent c-scope 4/25 noted only polyp and hemorrhoids  -possible cause of patient falls due to orthostasis  4/30 no BM for past 2 days as per RN, seems send diarrhea improving.  Discontinued cholestyramine. Last BM on 5/2   # Depression, continue ssri    # Type 1 diabetes mellitus   # Hyperlipidemia -continue statin    # Hypomagnesemia, mag repleted.   # Hypothyroid, patient was on Synthroid 50 mcg p.o. daily TSH 5.8, elevated, could be due to acute illness. Continue Synthroid 50 mcg daily, repeat TSH level after 4 to 6 weeks.     #  Right lower extremity chronic wound and bilateral lower extremity pigmentation and dry skin Wound care RN seen and follow recommendation continue Lotrisone cream BID   Body mass index is 29.69 kg/m.  Interventions:       Pressure Injury 01/12/23 Buttocks Right;Left Stage 2 -  Partial thickness loss of dermis presenting as a shallow open injury with a red, pink wound bed without slough. (Active)  01/12/23 2330  Location: Buttocks  Location Orientation: Right;Left  Staging: Stage 2 -  Partial thickness loss of dermis presenting as a shallow open injury with a red, pink wound bed without slough.  Wound Description (Comments):   Present on Admission: Yes  Dressing Type Foam - Lift dressing to assess site every shift 02/16/23 1000     Pressure Injury 01/14/23 Coccyx Bilateral Deep Tissue Pressure Injury - Purple or maroon localized area of discolored intact skin or blood-filled blister due to damage of underlying soft tissue from pressure and/or shear. 4 DTI areas noted (Active)  01/14/23 1751  Location: Coccyx  Location Orientation: Bilateral  Staging: Deep Tissue Pressure Injury - Purple or maroon localized area of discolored intact skin or blood-filled blister due to damage of underlying soft tissue from pressure and/or shear.  Wound Description (Comments): 4 DTI  areas noted  Present on Admission:   Dressing Type Foam - Lift dressing to assess site every shift 02/16/23 1000         Consultants: Palliative care, wound care nurse Disposition: Skilled nursing facility Diet recommendation:  Discharge Diet Orders (From admission, onward)     Start     Ordered   02/17/23 0000  Diet - low sodium heart healthy        02/17/23 1123           Carb modified diet DISCHARGE MEDICATION: Allergies as of 02/17/2023       Reactions   Rofecoxib Nausea Only        Medication List     STOP taking these medications    insulin pump Soln   lidocaine-prilocaine cream Commonly  known as: EMLA       TAKE these medications    cholestyramine 4 g packet Commonly known as: QUESTRAN Take 1 packet (4 g total) by mouth 2 (two) times daily.   clotrimazole-betamethasone cream Commonly known as: LOTRISONE Apply topically 2 (two) times daily.   cyanocobalamin 1000 MCG tablet Commonly known as: VITAMIN B12 Take 1,000 mcg by mouth daily.   feeding supplement (GLUCERNA SHAKE) Liqd Take 237 mLs by mouth 2 (two) times daily between meals.   ferrous sulfate 325 (65 FE) MG tablet Take 325 mg by mouth daily with breakfast.   FLUoxetine 20 MG tablet Commonly known as: PROZAC Take 1 tablet (20 mg total) by mouth daily.   folic acid 1 MG tablet Commonly known as: FOLVITE Take 1 tablet (1 mg total) by mouth daily. Start taking on: Feb 18, 2023   furosemide 40 MG tablet Commonly known as: LASIX Take 2 tablets in the morning, and 1 tablet in the evening.   glucose 4 GM chewable tablet Chew 1 tablet (4 g total) by mouth once as needed for low blood sugar.   insulin aspart 100 UNIT/ML injection Commonly known as: novoLOG Inject 6 Units into the skin 3 (three) times daily with meals. What changed:  how much to take how to take this when to take this additional instructions   lactobacillus Pack Take 1 packet (1 g total) by mouth 3 (three) times daily with meals.   levothyroxine 50 MCG tablet Commonly known as: SYNTHROID Take 1 tablet (50 mcg total) by mouth daily before breakfast.   loperamide 2 MG capsule Commonly known as: IMODIUM Take 1 capsule (2 mg total) by mouth as needed for diarrhea or loose stools.   midodrine 10 MG tablet Commonly known as: PROAMATINE Take 1 tablet (10 mg total) by mouth 3 (three) times daily with meals.   Na Sulfate-K Sulfate-Mg Sulf 17.5-3.13-1.6 GM/177ML Soln Take by mouth. Take as directed.   nystatin cream Commonly known as: MYCOSTATIN APPLY TO AFFECTED AREA TWICE A DAY What changed: See the new instructions.    pantoprazole 40 MG tablet Commonly known as: PROTONIX Take 1 tablet (40 mg total) by mouth 2 (two) times daily before a meal.   potassium chloride SA 20 MEQ tablet Commonly known as: KLOR-CON M Take 1 tablet (20 mEq total) by mouth 2 (two) times daily.   pravastatin 40 MG tablet Commonly known as: PRAVACHOL TAKE 1 TABLET BY MOUTH EVERY DAY   promethazine 12.5 MG tablet Commonly known as: PHENERGAN Take 12.5 mg by mouth every 6 (six) hours as needed for nausea or vomiting.   Semglee (yfgn) 100 UNIT/ML Pen Generic drug: insulin glargine-yfgn Inject 4 Units into  the skin at bedtime.   traMADol 50 MG tablet Commonly known as: Ultram Take 1 tablet (50 mg total) by mouth every 12 (twelve) hours as needed for up to 3 days.   vitamin D3 50 MCG (2000 UT) Caps Take 2,000 Units by mouth daily.               Discharge Care Instructions  (From admission, onward)           Start     Ordered   02/17/23 0000  Discharge wound care:       Comments: As above   02/17/23 1123            Follow-up Information     Drake Leach, PA-C Follow up on 03/01/2023.   Specialty: Neurosurgery Contact information: [address] [place] [address] [phone]                Discharge Exam: Ceasar Mons Weights   02/05/23 1905 02/06/23 1044  Weight: 96.6 kg 91.2 kg   General.  Chronically ill-appearing elderly man, in no acute distress.  Neck collar in place Pulmonary.  Lungs clear bilaterally, normal respiratory effort. CV.  Regular rate and rhythm, no JVD, rub or murmur. Abdomen.  Soft, nontender, nondistended, BS positive. CNS.  Alert and oriented .  No focal neurologic deficit. Extremities.  Bilateral lower extremity wound seems healing, Ace wrap in place Psychiatry.  Judgment and insight appears normal.   Condition at discharge: fair  The results of significant diagnostics from this hospitalization (including imaging, microbiology, ancillary  and laboratory) are listed below for reference.   Imaging Studies: CT HEAD WO CONTRAST ( )  Result Date: 02/14/2023 CLINICAL DATA:  Initial evaluation for mental status change, persistent or worsening. EXAM: CT HEAD WITHOUT CONTRAST TECHNIQUE: Contiguous axial images were obtained from the base of the skull through the vertex without intravenous contrast. RADIATION DOSE REDUCTION: This exam was performed according to the departmental dose-optimization program which includes automated exposure control, adjustment of the mA and/or kV according to patient size and/or use of iterative reconstruction technique. COMPARISON:  CT from 02/05/2023. FINDINGS: Brain: Generalized age-related cerebral atrophy with chronic small vessel ischemic disease. No acute intracranial hemorrhage. No acute large vessel territory infarct. No mass lesion, midline shift or mass effect. No hydrocephalus or extra-axial fluid collection. Vascular: No abnormal hyperdense vessel. Calcified atherosclerosis present at the skull base. Skull: Evolving right posterior scalp contusion with laceration with skin staples in place. Calvarium intact. Sinuses/Orbits: Globes and orbital soft tissues demonstrate no acute finding. Paranasal sinuses are clear. No significant mastoid effusion. Other: None. IMPRESSION: 1. No acute intracranial abnormality. 2. Evolving right posterior scalp contusion with laceration. No calvarial fracture. 3. Generalized age-related cerebral atrophy with chronic small vessel ischemic disease. Electronically Signed   By: Rise Mu M.D.   On: 02/14/2023 19:44   DG Pelvis 1-2 Views  Result Date: 02/05/2023 CLINICAL DATA:  Fall EXAM: PELVIS - 1-2 VIEW COMPARISON:  None Available. FINDINGS: The bones are osteopenic. No displaced fractures are visualized. The joint spaces are maintained. Are vascular calcifications in the soft tissues. IMPRESSION: Osteopenia. No displaced fractures are visualized. If there is high  clinical concern for hip fracture recommend dedicated hip views. Electronically Signed   By: Darliss Cheney M.D.   On: 02/05/2023 22:41   DG Chest Portable 1 View  Result Date: 02/05/2023 CLINICAL DATA:  Unwitnessed fall. EXAM: PORTABLE CHEST 1 VIEW COMPARISON:  01/12/2023 FINDINGS: Cardiac pacemaker. Power port type central  venous catheter with tip over the cavoatrial junction region. No pneumothorax. Heart size and pulmonary vascularity are normal for technique. Interstitial changes in the lungs likely representing fibrosis. No focal consolidation. No pleural effusions. Mediastinal contours appear intact. Degenerative changes in the spine and shoulders. Old rib fractures. IMPRESSION: Chronic interstitial changes consistent with fibrosis. No evidence of active pulmonary disease. Electronically Signed   By: Burman Nieves M.D.   On: 02/05/2023 21:22   CT HEAD WO CONTRAST ( )  Result Date: 02/05/2023 CLINICAL DATA:  Unwitnessed fall EXAM: CT HEAD WITHOUT CONTRAST CT CERVICAL SPINE WITHOUT CONTRAST TECHNIQUE: Multidetector CT imaging of the head and cervical spine was performed following the standard protocol without intravenous contrast. Multiplanar CT image reconstructions of the cervical spine were also generated. RADIATION DOSE REDUCTION: This exam was performed according to the departmental dose-optimization program which includes automated exposure control, adjustment of the mA and/or kV according to patient size and/or use of iterative reconstruction technique. COMPARISON:  01/12/2023 CT head and cervical spine FINDINGS: CT HEAD FINDINGS Brain: No evidence of acute infarct, hemorrhage, mass, mass effect, or midline shift. No hydrocephalus or extra-axial fluid collection. Periventricular white matter changes, likely the sequela of chronic small vessel ischemic disease. Vascular: No hyperdense vessel. Atherosclerotic calcifications in the intracranial carotid and vertebral arteries. Skull: Negative for  fracture or focal lesion. Right parietal scalp hematoma Sinuses/Orbits: No acute finding. Status post bilateral lens replacements. Other: The mastoid air cells are well aerated. CT CERVICAL SPINE FINDINGS Alignment: Unchanged trace anterolisthesis of C3 on C4 and C4 on C5. Skull base and vertebrae: Distraction injury at C6-C7, with widening of the previously anteriorly fused disc space (series 6, image 26) and fracture through the right anterior superior C7 endplate (series 7, image 32 and series 6, image 20). The posterior aspect of the disc space appears unchanged. Unchanged partial osseous fusion across the C3-C4 disc space posteriorly. Soft tissues and spinal canal: Prevertebral fluid anterior to C4-T2. (Series 3, image 68). No visible canal hematoma. Disc levels: Degenerative changes in the cervical spine. No significant spinal canal stenosis. Upper chest: Small right pleural effusion. No focal pulmonary opacity. IMPRESSION: 1. Distraction injury at C6-C7, with fracture through the right anterior superior C7 endplate and widening of the anterior aspect of the disc space at C6-C7. Prevertebral fluid anterior to C4-T2. 2. No acute intracranial process. 3. Small right pleural effusion. These findings were discussed by telephone on 02/05/2023 at 8:21 pm with provider PATRICK ROBINSON . Electronically Signed   By: Wiliam Ke M.D.   On: 02/05/2023 20:24   CT Cervical Spine Wo Contrast  Result Date: 02/05/2023 CLINICAL DATA:  Unwitnessed fall EXAM: CT HEAD WITHOUT CONTRAST CT CERVICAL SPINE WITHOUT CONTRAST TECHNIQUE: Multidetector CT imaging of the head and cervical spine was performed following the standard protocol without intravenous contrast. Multiplanar CT image reconstructions of the cervical spine were also generated. RADIATION DOSE REDUCTION: This exam was performed according to the departmental dose-optimization program which includes automated exposure control, adjustment of the mA and/or kV according  to patient size and/or use of iterative reconstruction technique. COMPARISON:  01/12/2023 CT head and cervical spine FINDINGS: CT HEAD FINDINGS Brain: No evidence of acute infarct, hemorrhage, mass, mass effect, or midline shift. No hydrocephalus or extra-axial fluid collection. Periventricular white matter changes, likely the sequela of chronic small vessel ischemic disease. Vascular: No hyperdense vessel. Atherosclerotic calcifications in the intracranial carotid and vertebral arteries. Skull: Negative for fracture or focal lesion. Right parietal scalp hematoma Sinuses/Orbits: No acute  finding. Status post bilateral lens replacements. Other: The mastoid air cells are well aerated. CT CERVICAL SPINE FINDINGS Alignment: Unchanged trace anterolisthesis of C3 on C4 and C4 on C5. Skull base and vertebrae: Distraction injury at C6-C7, with widening of the previously anteriorly fused disc space (series 6, image 26) and fracture through the right anterior superior C7 endplate (series 7, image 32 and series 6, image 20). The posterior aspect of the disc space appears unchanged. Unchanged partial osseous fusion across the C3-C4 disc space posteriorly. Soft tissues and spinal canal: Prevertebral fluid anterior to C4-T2. (Series 3, image 68). No visible canal hematoma. Disc levels: Degenerative changes in the cervical spine. No significant spinal canal stenosis. Upper chest: Small right pleural effusion. No focal pulmonary opacity. IMPRESSION: 1. Distraction injury at C6-C7, with fracture through the right anterior superior C7 endplate and widening of the anterior aspect of the disc space at C6-C7. Prevertebral fluid anterior to C4-T2. 2. No acute intracranial process. 3. Small right pleural effusion. These findings were discussed by telephone on 02/05/2023 at 8:21 pm with provider PATRICK ROBINSON . Electronically Signed   By: Wiliam Ke M.D.   On: 02/05/2023 20:24   ECHOCARDIOGRAM COMPLETE  Result Date: 02/04/2023     ECHOCARDIOGRAM REPORT   Patient Name:   NILSON GOATLEY Date of Exam: 02/03/2023 Medical Rec #:  [id number]    Height:       69.0 in Accession #:    [id number]   Weight:       213.0 lb Date of Birth:  [birth year]    BSA:          2.122 m Patient Age:    76 years     BP:           103/54 mmHg Patient Gender: M            HR:           71 bpm. Exam Location:  [facility] Procedure: 2D Echo, Cardiac Doppler and Color Doppler Indications:     Dyspnea  History:         Patient has prior history of Echocardiogram examinations, most                  recent 08/20/2019. CHF and Cardiomyopathy, Angina and CAD,                  Pacemaker, Arrythmias:Bradycardia and PVC,                  Signs/Symptoms:Dyspnea, Shortness of Breath and Edema; Risk                  Factors:Hypertension and Diabetes.  Sonographer:     Mikki Harbor Referring Phys:  [id number] DWAYNE D CALLWOOD Diagnosing Phys: Alwyn Pea MD IMPRESSIONS  1. Left ventricular ejection fraction, by estimation, is 60 to 65%. The left ventricle has normal function. The left ventricle has no regional wall motion abnormalities. The left ventricular internal cavity size was mildly dilated. Left ventricular diastolic parameters are consistent with Grade I diastolic dysfunction (impaired relaxation).  2. Right ventricular systolic function is normal. The right ventricular size is normal. There is normal pulmonary artery systolic pressure.  3. Left atrial size was mildly dilated.  4. Right atrial size was mildly dilated.  5. The mitral valve is normal in structure. Trivial mitral valve regurgitation.  6. The aortic valve is normal in structure. Aortic valve regurgitation is not visualized. FINDINGS  Left Ventricle: Left  ventricular ejection fraction, by estimation, is 60 to 65%. The left ventricle has normal function. The left ventricle has no regional wall motion abnormalities. The left ventricular internal cavity size was mildly dilated. There is  borderline concentric left  ventricular hypertrophy. Left ventricular diastolic parameters are consistent with Grade I diastolic dysfunction (impaired relaxation). Right Ventricle: The right ventricular size is normal. No increase in right ventricular wall thickness. Right ventricular systolic function is normal. There is normal pulmonary artery systolic pressure. The tricuspid regurgitant velocity is 2.33 m/s, and  with an assumed right atrial pressure of 8 mmHg, the estimated right ventricular systolic pressure is 29.7 mmHg. Left Atrium: Left atrial size was mildly dilated. Right Atrium: Right atrial size was mildly dilated. Pericardium: There is no evidence of pericardial effusion. Mitral Valve: The mitral valve is normal in structure. Trivial mitral valve regurgitation. MV peak gradient, 3.1 mmHg. The mean mitral valve gradient is 1.0 mmHg. Tricuspid Valve: The tricuspid valve is normal in structure. Tricuspid valve regurgitation is not demonstrated. Aortic Valve: The aortic valve is normal in structure. Aortic valve regurgitation is not visualized. Aortic valve mean gradient measures 3.0 mmHg. Aortic valve peak gradient measures 6.7 mmHg. Aortic valve area, by VTI measures 2.72 cm. Pulmonic Valve: The pulmonic valve was normal in structure. Pulmonic valve regurgitation is not visualized. Aorta: The ascending aorta was not well visualized. IAS/Shunts: No atrial level shunt detected by color flow Doppler.  LEFT VENTRICLE PLAX 2D LVIDd:         5.00 cm   Diastology LVIDs:         3.30 cm   LV e' medial:    7.94 cm/s LV PW:         1.10 cm   LV E/e' medial:  7.9 LV IVS:        1.10 cm   LV e' lateral:   13.90 cm/s LVOT diam:     2.00 cm   LV E/e' lateral: 4.5 LV SV:         83 LV SV Index:   39 LVOT Area:     3.14 cm  RIGHT VENTRICLE RV Basal diam:  3.65 cm RV Mid diam:    3.60 cm RV S prime:     12.20 cm/s LEFT ATRIUM             Index        RIGHT ATRIUM           Index LA diam:        4.00 cm 1.88 cm/m   RA Area:     18.50 cm LA Vol  (A2C):   64.0 ml 30.16 ml/m  RA Volume:   50.20 ml  23.66 ml/m LA Vol (A4C):   56.8 ml 26.77 ml/m LA Biplane Vol: 63.3 ml 29.83 ml/m  AORTIC VALVE                    PULMONIC VALVE AV Area (Vmax):    2.75 cm     PV Vmax:       1.03 m/s AV Area (Vmean):   2.65 cm     PV Peak grad:  4.2 mmHg AV Area (VTI):     2.72 cm AV Vmax:           129.00 cm/s AV Vmean:          84.300 cm/s AV VTI:            0.305 m AV Peak Grad:  6.7 mmHg AV Mean Grad:      3.0 mmHg LVOT Vmax:         113.00 cm/s LVOT Vmean:        71.100 cm/s LVOT VTI:          0.264 m LVOT/AV VTI ratio: 0.87  AORTA Ao Root diam: 3.00 cm MITRAL VALVE               TRICUSPID VALVE MV Area (PHT): 4.01 cm    TR Peak grad:   21.7 mmHg MV Area VTI:   2.67 cm    TR Vmax:        233.00 cm/s MV Peak grad:  3.1 mmHg MV Mean grad:  1.0 mmHg    SHUNTS MV Vmax:       0.88 m/s    Systemic VTI:  0.26 m MV Vmean:      52.8 cm/s   Systemic Diam: 2.00 cm MV Decel Time: 189 msec MV E velocity: 62.90 cm/s MV A velocity: 88.00 cm/s MV E/A ratio:  0.71 Alwyn Pea MD Electronically signed by Alwyn Pea MD Signature Date/Time: 02/04/2023/1:27:48 PM    Final    CT ABDOMEN PELVIS W CONTRAST  Result Date: 01/22/2023 CLINICAL DATA:  Chronic diarrhea. Recurrent stage III mantle cell lymphoma. EXAM: CT ABDOMEN AND PELVIS WITH CONTRAST TECHNIQUE: Multidetector CT imaging of the abdomen and pelvis was performed using the standard protocol following bolus administration of intravenous contrast. RADIATION DOSE REDUCTION: This exam was performed according to the departmental dose-optimization program which includes automated exposure control, adjustment of the mA and/or kV according to patient size and/or use of iterative reconstruction technique. CONTRAST:  OMNIPAQUE IOHEXOL 300 MG/ML  SOLN COMPARISON:  CT of the abdomen and pelvis without contrast 01/12/2023. CT scan 12/10/2022 FINDINGS: Lower chest: Moderate bilateral pleural effusions are present.  Associated dependent airspace opacities are present. Pacing wires noted. Heart size is within normal limits. No significant pericardial effusion is present. Hepatobiliary: No focal liver abnormality is seen. Status post cholecystectomy. No biliary dilatation. Pancreas: Pancreas is somewhat atrophic. No focal lesions or inflammation are present. Spleen: Normal in size without focal abnormality. Adrenals/Urinary Tract: Adrenal glands are normal bilaterally. Right nephrectomy noted. The left kidney demonstrates a stable 12 mm simple cyst near the lower pole. No other focal lesions are present. No stone or obstruction is present. The left ureter is within normal limits. The urinary bladder is normal. Stomach/Bowel: The stomach and duodenum are within normal limits. The small bowel is unremarkable. Terminal ileum is within normal limits. Appendix is not discretely visualized and may be surgically absent. The ascending and transverse colon are within normal limits. The descending and sigmoid colon normal. Vascular/Lymphatic: Atherosclerotic calcifications are again noted within the aorta and branch vessels. No aneurysm is present. Reproductive: Prostate is unremarkable. The seminal vesicles are within normal limits bilaterally. Other: A paraumbilical hernia contains fat without bowel. Fat also herniates into the left inguinal canal without associated bowel. Musculoskeletal: Degenerative changes are again noted in the lumbar spine. Right SI joint is fused. No focal osseous lesions are present. IMPRESSION: 1. No acute or focal lesion to explain the patient's symptoms. 2. Moderate bilateral pleural effusions with associated dependent airspace opacities. 3. Right nephrectomy. 4. Stable 12 mm simple cyst near the lower pole of the left kidney. 5. Periumbilical and left inguinal hernias contain fat without bowel. 6.  Aortic Atherosclerosis (ICD10-I70.0). Electronically Signed   By: Marin Roberts M.D.   On: 01/22/2023  11:12    Microbiology: Results for orders placed or performed during the hospital encounter of 02/05/23  Urine Culture (for pregnant, neutropenic or urologic patients or patients with an indwelling urinary catheter)     Status: Abnormal   Collection Time: 02/05/23 10:42 PM   Specimen: Urine, Clean Catch  Result Value Ref Range Status   Specimen Description   Final    URINE, CLEAN CATCH Performed at [facility], [address], [place], [place] [address]    Special Requests   Final    NONE Performed at [facility], [address] [place], [place] [address]    Culture (A)  Final    <10,000 COLONIES/mL INSIGNIFICANT GROWTH Performed at [facility], [address] [place], [place] [address]    Report Status 02/07/2023 FINAL  Final   *Note: Due to a large number of results and/or encounters for the requested time period, some results have not been displayed. A complete set of results can be found in Results Review.    Labs: CBC: Recent Labs  Lab 02/11/23 0600 02/15/23 0500  WBC 1.5* 1.6*  NEUTROABS 0.4*  --   HGB 8.4* 8.9*  HCT 27.3* 28.9*  MCV 96.8 97.6  PLT 102* 98*   Basic Metabolic Panel: Recent Labs  Lab 02/11/23 0600 02/15/23 0500 02/16/23 0530 02/17/23 0610  NA 140 141 138 138  K 4.1 4.0 4.3 4.0  CL 109 107 105 104  CO2 25 25 28 31   GLUCOSE 122* 164* 236* 146*  BUN 9 9 8  7*  CREATININE 0.87 0.98 0.88 0.81  CALCIUM 7.6* 7.8* 7.7* 7.6*  MG  --   --  1.5* 2.0   Liver Function Tests: No results for input(s): "AST", "ALT", "ALKPHOS", "BILITOT", "PROT", "ALBUMIN" in the last 168 hours. CBG: Recent Labs  Lab 02/16/23 1620 02/16/23 1922 02/16/23 2332 02/17/23 0401 02/17/23 0746  GLUCAP 216* 210* 189* 146* 140*    Discharge time spent: greater than 30 minutes.  Signed: Delfino Lovett, MD [facility] 02/17/2023

## 2023-02-17 NOTE — Consult Note (Signed)
[facility] Customer service manager [facility]) Accountable Care Organization (ACO) [facility] Liaison Note  02/17/2023  Gary [name] 01/22/1946 [id number]  Location: [facility] RN Hospital Liaison screened the patient remotely at [name].  Insurance: MCR ACO   Gary [name] is a 77 y.o. male who is a Primary Care Patient of Dale Kingsland, MD.[facility]. The patient was screened for 30 day readmission hospitalization with noted extreme risk score for unplanned readmission risk with 2 IP in 6 months.  The patient was assessed for potential [facility]) Care Management service needs for post hospital transition for care coordination. Review of patient's electronic medical record reveals patient has s history of falls. Due to pt's decreased functional ability over the weekend pending possible scan/test. The pending discharge disposition  is for pt to go to a nursing home if bed is offered at (the [facility]). No [facility] needs presented at this time.  Plan: [facility] Liaison will continue to follow progress and disposition to asess for post hospital community care coordination/management needs.  Referral request for community care coordination: pending disposition.   [facility] Care Management/Population Health does not replace or interfere with any arrangements made by the Inpatient Transition of Care team.   For questions contact:   Elliot Cousin, RN, BSN [facility] Liaison [place]   [facility]  Population Health Office Hours MTWF 8:00 am to 6 pm off on Thursday [phone] mobile [phone] [Office toll free line][facility] Office Hours are M-F 8:30 - 5 pm 24 hour nurse advise line [phone] Conceirge  [email or web address]

## 2023-02-17 NOTE — Progress Notes (Signed)
Physical Therapy Treatment Patient Details Name: Gary [name] MRN: [id number] DOB: 1946/08/22 Today's Date: 02/17/2023   History of Present Illness Gary [name] is a 77 y.o. male with medical history significant of   recurrent stage III mantle cell lymphoma s/p 43 cycles of Rituxan and Treanda, depression, insulin-dependent diabetes mellitus, hypertension, hyperlipidemia, who presents to ED with increase falls over the last week at [facility] nursing home. Pt had been discharged to [facility]) after hospital admission in mid April 2024. Prior to recent hospitalization he was living at home alone. He has experienced multiple falls over last month. Pt also reports increased difficulty swallowing over last month. Cervical X-ray reveals Distraction injury at C6-C7, with fracture through the right  anterior superior C7 endplate and widening of the anterior aspect of  the disc space at C6-C7. Prevertebral fluid anterior to C4-T2. Neurosurgery Dr Welton Flakes recommends cervical collar and out patient follow up.    PT Comments    Patient received sleeping in bed. Wakes easily to my voice. He is agreeable to PT session. He does not want to sit in the recliner as he reports it is very uncomfortable. Patient requires min A to perform supine to sit. He is able to scoot to edge of bed independently. Patient performed sit to stand x2 with mod A from elevated surface. He is able to side step with walker along edge of bed 4' then 8 feet. Reports increased pain and increased sob with increased activity. Mild unsteadiness and fear of falling. Patient will continue to benefit from skilled PT to improve strength, balance and safety with mobility.    Recommendations for follow up therapy are one component of a multi-disciplinary discharge planning process, led by the attending physician.  Recommendations may be updated based on patient status, additional functional criteria and insurance authorization.  Follow Up  Recommendations  Can patient physically be transported by private vehicle: No    Assistance Recommended at Discharge Frequent or constant Supervision/Assistance  Patient can return home with the following A little help with walking and/or transfers;A little help with bathing/dressing/bathroom;Assist for transportation;Help with stairs or ramp for entrance   Equipment Recommendations  None recommended by PT    Recommendations for Other Services       Precautions / Restrictions Precautions Precautions: Fall;Cervical Precaution Comments: Cervical collar; C7 fracture Restrictions Weight Bearing Restrictions: No     Mobility  Bed Mobility Overal bed mobility: Needs Assistance Bed Mobility: Supine to Sit, Sit to Supine     Supine to sit: Min assist Sit to supine: Min assist        Transfers Overall transfer level: Needs assistance Equipment used: Rolling walker (2 wheels) Transfers: Sit to/from Stand Sit to Stand: From elevated surface, Mod assist                Ambulation/Gait Ambulation/Gait assistance: Min guard Gait Distance (Feet): 12 Feet Assistive device: Rolling walker (2 wheels) Gait Pattern/deviations: Step-to pattern, Decreased step length - right, Decreased step length - left Gait velocity: decreased     General Gait Details: small steps along edge of bed. 4' then 8 ' with min guard/supervision   Stairs             Wheelchair Mobility    Modified Rankin (Stroke Patients Only)       Balance Overall balance assessment: Needs assistance Sitting-balance support: Feet supported Sitting balance-Leahy Scale: Good     Standing balance support: Bilateral upper extremity supported, During functional activity,  Reliant on assistive device for balance Standing balance-Leahy Scale: Fair Standing balance comment: close supervision while at edge of bed                            Cognition Arousal/Alertness: Awake/alert Behavior  During Therapy: WFL for tasks assessed/performed Overall Cognitive Status: Within Functional Limits for tasks assessed                                          Exercises Other Exercises Other Exercises: seated LAQ, marching x 10 reps B LE    General Comments        Pertinent Vitals/Pain Pain Assessment Pain Assessment: Faces Faces Pain Scale: Hurts little more Pain Location: neck Pain Descriptors / Indicators: Discomfort, Sore Pain Intervention(s): Monitored during session, Repositioned    Home Living                          Prior Function            PT Goals (current goals can now be found in the care plan section) Acute Rehab PT Goals Patient Stated Goal: to improve PT Goal Formulation: With patient Time For Goal Achievement: 02/20/23 Potential to Achieve Goals: Fair Progress towards PT goals: Progressing toward goals    Frequency    Min 4X/week      PT Plan Frequency needs to be updated    Co-evaluation              AM-PAC PT "6 Clicks" Mobility   Outcome Measure  Help needed turning from your back to your side while in a flat bed without using bedrails?: A Little Help needed moving from lying on your back to sitting on the side of a flat bed without using bedrails?: A Little Help needed moving to and from a bed to a chair (including a wheelchair)?: A Little Help needed standing up from a chair using your arms (e.g., wheelchair or bedside chair)?: A Lot Help needed to walk in hospital room?: A Lot Help needed climbing 3-5 steps with a railing? : Total 6 Click Score: 14    End of Session Equipment Utilized During Treatment: Cervical collar Activity Tolerance: Patient tolerated treatment well Patient left: in bed;with call bell/phone within reach;with bed alarm set Nurse Communication: Mobility status PT Visit Diagnosis: Muscle weakness (generalized) (M62.81);Repeated falls (R29.6);Unsteadiness on feet  (R26.81);Pain;Difficulty in walking, not elsewhere classified (R26.2) Pain - part of body:  (neck)     Time: 1610-9604 PT Time Calculation (min) (ACUTE ONLY): 25 min  Charges:  $Gait Training: 8-22 mins $Therapeutic Exercise: 8-22 mins                     Claire Dolores, PT, GCS 02/17/23,11:29 AM

## 2023-02-19 ENCOUNTER — Other Ambulatory Visit: Payer: Self-pay

## 2023-02-19 DIAGNOSIS — C8310 Mantle cell lymphoma, unspecified site: Secondary | ICD-10-CM | POA: Diagnosis not present

## 2023-02-19 DIAGNOSIS — E109 Type 1 diabetes mellitus without complications: Secondary | ICD-10-CM | POA: Diagnosis not present

## 2023-02-19 DIAGNOSIS — S12600D Unspecified displaced fracture of seventh cervical vertebra, subsequent encounter for fracture with routine healing: Secondary | ICD-10-CM | POA: Diagnosis not present

## 2023-02-24 DIAGNOSIS — F32A Depression, unspecified: Secondary | ICD-10-CM | POA: Diagnosis not present

## 2023-02-24 NOTE — Progress Notes (Deleted)
Referring Physician:  Melina Modena, MD [address] [place],  [place] [address]  Primary Physician:  Dale , MD  History of Present Illness: 02/24/2023*** [name] has a history of recurrent stage III mantle cell lymphoma s/p 43 cycles of Rituxan and Treanda, depression, insulin-dependent diabetes mellitus, hypertension, hyperlipidemia.   Admitted on 02/05/23 and was found to have distraction injury at C6-C7 with fracture of C7. ED reviewed with Dr. Madaline Brilliant and he recommended he have MRI done (cannot have MRI due to pacemaker). He was to stay in cervical collar when he is OOB.   He is here for follow up.      Given ultram on discharge from the hospital.   Duration: *** Location: *** Quality: *** Severity: ***  Precipitating: aggravated by *** Modifying factors: made better by *** Weakness: none Timing: *** Bowel/Bladder Dysfunction: none  Conservative measures:  Physical therapy: ***  Multimodal medical therapy including regular antiinflammatories: ***  Injections: *** epidural steroid injections  Past Surgery: ***  [name] has ***no symptoms of cervical myelopathy.  The symptoms are causing a significant impact on the patient's life.   Review of Systems:  A 10 point review of systems is negative, except for the pertinent positives and negatives detailed in the HPI.  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    Gastroparesis    Graves disease    Hypercholesterolemia    Hypertension    Mantle cell lymphoma (HCC) 07/2014   Peripheral neuropathy    Shingles     Past Surgical History: Past Surgical History:  Procedure Laterality Date   CHOLECYSTECTOMY     COLONOSCOPY WITH PROPOFOL N/A 02/04/2023   Procedure: COLONOSCOPY WITH PROPOFOL;  Surgeon: [name], MD;  Location[facility]  ENDOSCOPY;  Service: Gastroenterology;  Laterality: N/A;   ESOPHAGOGASTRODUODENOSCOPY (EGD) WITH PROPOFOL N/A 01/18/2023   Procedure: ESOPHAGOGASTRODUODENOSCOPY (EGD) WITH PROPOFOL;  Surgeon: Jaynie Collins, DO;  Location: [facility] ENDOSCOPY;  Service: Gastroenterology;  Laterality: N/A;   NEPHRECTOMY     right   PACEMAKER INSERTION N/A 12/07/2017   Procedure: INSERTION PACEMAKER DUAL CHAMBER INITIAL INSERT;  Surgeon: Marcina Millard, MD;  Location: [facility] ORS;  Service: Cardiovascular;  Laterality: N/A;   PORTA CATH INSERTION N/A 11/03/2017   Procedure: PORTA CATH INSERTION;  Surgeon: Renford Dills, MD;  Location: [facility] INVASIVE CV LAB;  Service: Cardiovascular;  Laterality: N/A;    Allergies: Allergies as of 03/02/2023 - Review Complete 02/06/2023  Allergen Reaction Noted   Rofecoxib Nausea Only 04/09/2015    Medications: Outpatient Encounter Medications as of 03/02/2023  Medication Sig   Cholecalciferol (VITAMIN D3) 50 MCG (2000 UT) CAPS Take 2,000 Units by mouth daily.   cholestyramine (QUESTRAN) 4 g packet Take 1 packet (4 g total) by mouth 2 (two) times daily.   clotrimazole-betamethasone (LOTRISONE) cream Apply topically 2 (two) times daily.   feeding supplement, GLUCERNA SHAKE, (GLUCERNA SHAKE) LIQD Take 237 mLs by mouth 2 (two) times daily between meals.   ferrous sulfate 325 (65 FE) MG tablet Take 325 mg by mouth daily with breakfast.   FLUoxetine (PROZAC) 20 MG tablet Take 1 tablet (20 mg total) by mouth daily.   folic acid (FOLVITE) 1 MG tablet Take 1 tablet (1 mg total) by mouth daily.   furosemide (LASIX) 40 MG tablet Take 2 tablets in  the morning, and 1 tablet in the evening.   glucose 4 GM chewable tablet Chew 1 tablet (4 g total) by mouth once as needed for low blood sugar.   insulin aspart (NOVOLOG) 100 UNIT/ML injection Inject 6 Units into the skin 3 (three) times daily with meals. (Patient taking differently: Inject subcutaneously 3 times daily with meals per  sliding scale. 100-150; 0 units, 151-200; 3 units, 201-250; 5 units, 251-300; 7 units, 301-350; 9 units, 351-400; 11 units, 401-450; 13 units. Notify MD if blood sugar is greater than 450.)   insulin glargine-yfgn (SEMGLEE, YFGN,) 100 UNIT/ML Pen Inject 4 Units into the skin at bedtime.   lactobacillus (FLORANEX/LACTINEX) PACK Take 1 packet (1 g total) by mouth 3 (three) times daily with meals.   levothyroxine (SYNTHROID) 50 MCG tablet Take 1 tablet (50 mcg total) by mouth daily before breakfast.   loperamide (IMODIUM) 2 MG capsule Take 1 capsule (2 mg total) by mouth as needed for diarrhea or loose stools.   midodrine (PROAMATINE) 10 MG tablet Take 1 tablet (10 mg total) by mouth 3 (three) times daily with meals.   Na Sulfate-K Sulfate-Mg Sulf 17.5-3.13-1.6 GM/177ML SOLN Take by mouth. Take as directed.   nystatin cream (MYCOSTATIN) APPLY TO AFFECTED AREA TWICE A DAY (Patient taking differently: Apply topically twice daily for rash. Apply once every morning shift and once every evening.)   pantoprazole (PROTONIX) 40 MG tablet Take 1 tablet (40 mg total) by mouth 2 (two) times daily before a meal.   potassium chloride SA (KLOR-CON M) 20 MEQ tablet Take 1 tablet (20 mEq total) by mouth 2 (two) times daily.   pravastatin (PRAVACHOL) 40 MG tablet TAKE 1 TABLET BY MOUTH EVERY DAY   promethazine (PHENERGAN) 12.5 MG tablet Take 12.5 mg by mouth every 6 (six) hours as needed for nausea or vomiting.   vitamin B-12 (CYANOCOBALAMIN) 1000 MCG tablet Take 1,000 mcg by mouth daily.   Facility-Administered Encounter Medications as of 03/02/2023  Medication   heparin lock flush 100 unit/mL   sodium chloride flush (NS) 0.9 % injection 10 mL   sodium chloride flush (NS) 0.9 % injection 10 mL    Social History: Social History   Tobacco Use   Smoking status: Former    Types: Cigarettes    Passive exposure: Past   Smokeless tobacco: Current    Types: Chew  Vaping Use   Vaping Use: Never used  Substance  Use Topics   Alcohol use: Yes    Alcohol/week: 3.0 standard drinks of alcohol    Types: 3 Cans of beer per week    Comment: nightly   Drug use: Never    Family Medical History: Family History  Problem Relation Age of Onset   Breast cancer Mother    Diabetes Father    Cancer Father        Lymphoma   Alcohol abuse Maternal Uncle    Diabetes Sister    Heart disease Other        grandfather    Physical Examination: There were no vitals filed for this visit.  General: Patient is well developed, well nourished, calm, collected, and in no apparent distress. Attention to examination is appropriate.  Respiratory: Patient is breathing without any difficulty.   NEUROLOGICAL:     Awake, alert, oriented to person, place, and time.  Speech is clear and fluent. Fund of knowledge is appropriate.   Cranial Nerves: Pupils equal round and reactive to light.  Facial tone is symmetric.    ***  ROM of cervical spine *** pain *** posterior cervical tenderness. *** tenderness in bilateral trapezial region.   *** ROM of lumbar spine *** pain *** posterior lumbar tenderness.   No abnormal lesions on exposed skin.   Strength: Side Biceps Triceps Deltoid Interossei Grip Wrist Ext. Wrist Flex.  R 5 5 5 5 5 5 5   L 5 5 5 5 5 5 5    Side Iliopsoas Quads Hamstring PF DF EHL  R 5 5 5 5 5 5   L 5 5 5 5 5 5    Reflexes are ***2+ and symmetric at the biceps, triceps, brachioradialis, patella and achilles.   Hoffman's is absent.  Clonus is not present.   Bilateral upper and lower extremity sensation is intact to light touch.     Gait is normal.   ***No difficulty with tandem gait.    Medical Decision Making  Imaging: Xrays of cervical spine dated ***:  ***  Radiology report not available for above xrays.    CT of cervical spine dated 02/05/23:  FINDINGS: CT HEAD FINDINGS   Brain: No evidence of acute infarct, hemorrhage, mass, mass effect, or midline shift. No hydrocephalus or  extra-axial fluid collection. Periventricular white matter changes, likely the sequela of chronic small vessel ischemic disease.   Vascular: No hyperdense vessel. Atherosclerotic calcifications in the intracranial carotid and vertebral arteries.   Skull: Negative for fracture or focal lesion. Right parietal scalp hematoma   Sinuses/Orbits: No acute finding. Status post bilateral lens replacements.   Other: The mastoid air cells are well aerated.   CT CERVICAL SPINE FINDINGS   Alignment: Unchanged trace anterolisthesis of C3 on C4 and C4 on C5.   Skull base and vertebrae: Distraction injury at C6-C7, with widening of the previously anteriorly fused disc space (series 6, image 26) and fracture through the right anterior superior C7 endplate (series 7, image 32 and series 6, image 20). The posterior aspect of the disc space appears unchanged. Unchanged partial osseous fusion across the C3-C4 disc space posteriorly.   Soft tissues and spinal canal: Prevertebral fluid anterior to C4-T2. (Series 3, image 68). No visible canal hematoma.   Disc levels: Degenerative changes in the cervical spine. No significant spinal canal stenosis.   Upper chest: Small right pleural effusion. No focal pulmonary opacity.   IMPRESSION: 1. Distraction injury at C6-C7, with fracture through the right anterior superior C7 endplate and widening of the anterior aspect of the disc space at C6-C7. Prevertebral fluid anterior to C4-T2. 2. No acute intracranial process. 3. Small right pleural effusion.   These findings were discussed by telephone on 02/05/2023 at 8:21 pm with provider PATRICK ROBINSON .     Electronically Signed   By: Wiliam Ke M.D.   On: 02/05/2023 20:24   I have personally reviewed the images and agree with the above interpretation.  Assessment and Plan: Mr. Linde is a pleasant 77 y.o. male has ***  Treatment options discussed with patient and following plan made:   - Order  for physical therapy for *** spine ***. Patient to call to schedule appointment. *** - Continue current medications including ***. Reviewed dosing and side effects.  - Prescription for ***. Reviewed dosing and side effects. Take with food.  - Prescription for *** to take prn muscle spasms. Reviewed dosing and side effects. Discussed this can cause drowsiness.  - MRI of *** to further evaluate *** radiculopathy. No improvement time or medications (***).  - Referral to PMR at [facility] to discuss  possible *** injections.  - Will schedule phone visit to review MRI results once I get them back.   I spent a total of *** minutes in face-to-face and non-face-to-face activities related to this patient's care today including review of outside records, review of imaging, review of symptoms, physical exam, discussion of differential diagnosis, discussion of treatment options, and documentation.   Thank you for involving me in the care of this patient.   Drake Leach PA-C Dept. of Neurosurgery

## 2023-03-01 ENCOUNTER — Ambulatory Visit: Payer: 59 | Admitting: Orthopedic Surgery

## 2023-03-02 ENCOUNTER — Other Ambulatory Visit: Payer: Self-pay

## 2023-03-02 ENCOUNTER — Ambulatory Visit: Payer: 59 | Admitting: Orthopedic Surgery

## 2023-03-02 DIAGNOSIS — E11649 Type 2 diabetes mellitus with hypoglycemia without coma: Secondary | ICD-10-CM | POA: Diagnosis not present

## 2023-03-02 DIAGNOSIS — R2681 Unsteadiness on feet: Secondary | ICD-10-CM | POA: Diagnosis not present

## 2023-03-02 DIAGNOSIS — R600 Localized edema: Secondary | ICD-10-CM | POA: Diagnosis not present

## 2023-03-02 DIAGNOSIS — E109 Type 1 diabetes mellitus without complications: Secondary | ICD-10-CM | POA: Diagnosis not present

## 2023-03-02 DIAGNOSIS — R5381 Other malaise: Secondary | ICD-10-CM | POA: Diagnosis not present

## 2023-03-02 DIAGNOSIS — D693 Immune thrombocytopenic purpura: Secondary | ICD-10-CM | POA: Diagnosis not present

## 2023-03-02 DIAGNOSIS — R402431 Glasgow coma scale score 3-8, in the field [EMT or ambulance]: Secondary | ICD-10-CM | POA: Diagnosis not present

## 2023-03-02 DIAGNOSIS — I501 Left ventricular failure: Secondary | ICD-10-CM | POA: Diagnosis not present

## 2023-03-02 DIAGNOSIS — R404 Transient alteration of awareness: Secondary | ICD-10-CM | POA: Diagnosis not present

## 2023-03-02 DIAGNOSIS — E785 Hyperlipidemia, unspecified: Secondary | ICD-10-CM | POA: Diagnosis not present

## 2023-03-02 DIAGNOSIS — R579 Shock, unspecified: Secondary | ICD-10-CM | POA: Diagnosis not present

## 2023-03-02 DIAGNOSIS — R319 Hematuria, unspecified: Secondary | ICD-10-CM | POA: Diagnosis not present

## 2023-03-02 DIAGNOSIS — R6521 Severe sepsis with septic shock: Secondary | ICD-10-CM | POA: Diagnosis not present

## 2023-03-02 DIAGNOSIS — I83019 Varicose veins of right lower extremity with ulcer of unspecified site: Secondary | ICD-10-CM | POA: Diagnosis not present

## 2023-03-02 DIAGNOSIS — C8315 Mantle cell lymphoma, lymph nodes of inguinal region and lower limb: Secondary | ICD-10-CM | POA: Diagnosis not present

## 2023-03-02 DIAGNOSIS — E1065 Type 1 diabetes mellitus with hyperglycemia: Secondary | ICD-10-CM | POA: Diagnosis not present

## 2023-03-02 DIAGNOSIS — S12600D Unspecified displaced fracture of seventh cervical vertebra, subsequent encounter for fracture with routine healing: Secondary | ICD-10-CM | POA: Diagnosis not present

## 2023-03-02 DIAGNOSIS — F32A Depression, unspecified: Secondary | ICD-10-CM | POA: Diagnosis not present

## 2023-03-02 DIAGNOSIS — I959 Hypotension, unspecified: Secondary | ICD-10-CM | POA: Diagnosis not present

## 2023-03-02 DIAGNOSIS — E039 Hypothyroidism, unspecified: Secondary | ICD-10-CM | POA: Diagnosis not present

## 2023-03-02 DIAGNOSIS — I63441 Cerebral infarction due to embolism of right cerebellar artery: Secondary | ICD-10-CM | POA: Diagnosis not present

## 2023-03-02 DIAGNOSIS — N179 Acute kidney failure, unspecified: Secondary | ICD-10-CM | POA: Diagnosis not present

## 2023-03-02 DIAGNOSIS — I83029 Varicose veins of left lower extremity with ulcer of unspecified site: Secondary | ICD-10-CM | POA: Diagnosis not present

## 2023-03-02 DIAGNOSIS — K7682 Hepatic encephalopathy: Secondary | ICD-10-CM | POA: Diagnosis not present

## 2023-03-02 DIAGNOSIS — D61818 Other pancytopenia: Secondary | ICD-10-CM | POA: Diagnosis not present

## 2023-03-02 DIAGNOSIS — R8281 Pyuria: Secondary | ICD-10-CM | POA: Diagnosis not present

## 2023-03-02 DIAGNOSIS — R918 Other nonspecific abnormal finding of lung field: Secondary | ICD-10-CM | POA: Diagnosis not present

## 2023-03-02 DIAGNOSIS — E872 Acidosis, unspecified: Secondary | ICD-10-CM | POA: Diagnosis not present

## 2023-03-02 DIAGNOSIS — Z20822 Contact with and (suspected) exposure to covid-19: Secondary | ICD-10-CM | POA: Diagnosis not present

## 2023-03-02 DIAGNOSIS — K7200 Acute and subacute hepatic failure without coma: Secondary | ICD-10-CM | POA: Diagnosis not present

## 2023-03-02 DIAGNOSIS — G9389 Other specified disorders of brain: Secondary | ICD-10-CM | POA: Diagnosis not present

## 2023-03-02 DIAGNOSIS — I10 Essential (primary) hypertension: Secondary | ICD-10-CM | POA: Diagnosis not present

## 2023-03-02 DIAGNOSIS — A419 Sepsis, unspecified organism: Secondary | ICD-10-CM | POA: Diagnosis not present

## 2023-03-02 DIAGNOSIS — G928 Other toxic encephalopathy: Secondary | ICD-10-CM | POA: Diagnosis not present

## 2023-03-02 DIAGNOSIS — Z1152 Encounter for screening for COVID-19: Secondary | ICD-10-CM | POA: Diagnosis not present

## 2023-03-02 DIAGNOSIS — M8580 Other specified disorders of bone density and structure, unspecified site: Secondary | ICD-10-CM | POA: Diagnosis not present

## 2023-03-02 DIAGNOSIS — I110 Hypertensive heart disease with heart failure: Secondary | ICD-10-CM | POA: Diagnosis not present

## 2023-03-02 DIAGNOSIS — S12600A Unspecified displaced fracture of seventh cervical vertebra, initial encounter for closed fracture: Secondary | ICD-10-CM | POA: Diagnosis not present

## 2023-03-02 DIAGNOSIS — E43 Unspecified severe protein-calorie malnutrition: Secondary | ICD-10-CM | POA: Diagnosis not present

## 2023-03-02 DIAGNOSIS — E1165 Type 2 diabetes mellitus with hyperglycemia: Secondary | ICD-10-CM | POA: Diagnosis not present

## 2023-03-02 DIAGNOSIS — R490 Dysphonia: Secondary | ICD-10-CM | POA: Diagnosis not present

## 2023-03-02 DIAGNOSIS — D519 Vitamin B12 deficiency anemia, unspecified: Secondary | ICD-10-CM | POA: Diagnosis not present

## 2023-03-02 DIAGNOSIS — K529 Noninfective gastroenteritis and colitis, unspecified: Secondary | ICD-10-CM | POA: Diagnosis not present

## 2023-03-02 DIAGNOSIS — R262 Difficulty in walking, not elsewhere classified: Secondary | ICD-10-CM | POA: Diagnosis not present

## 2023-03-02 DIAGNOSIS — R499 Unspecified voice and resonance disorder: Secondary | ICD-10-CM | POA: Diagnosis not present

## 2023-03-02 DIAGNOSIS — D529 Folate deficiency anemia, unspecified: Secondary | ICD-10-CM | POA: Diagnosis not present

## 2023-03-02 DIAGNOSIS — D649 Anemia, unspecified: Secondary | ICD-10-CM | POA: Diagnosis not present

## 2023-03-02 DIAGNOSIS — I639 Cerebral infarction, unspecified: Secondary | ICD-10-CM | POA: Diagnosis not present

## 2023-03-02 DIAGNOSIS — Z794 Long term (current) use of insulin: Secondary | ICD-10-CM | POA: Diagnosis not present

## 2023-03-02 DIAGNOSIS — Z66 Do not resuscitate: Secondary | ICD-10-CM | POA: Diagnosis not present

## 2023-03-02 DIAGNOSIS — M47812 Spondylosis without myelopathy or radiculopathy, cervical region: Secondary | ICD-10-CM | POA: Diagnosis not present

## 2023-03-02 DIAGNOSIS — R739 Hyperglycemia, unspecified: Secondary | ICD-10-CM | POA: Diagnosis not present

## 2023-03-02 DIAGNOSIS — L97909 Non-pressure chronic ulcer of unspecified part of unspecified lower leg with unspecified severity: Secondary | ICD-10-CM | POA: Diagnosis not present

## 2023-03-02 DIAGNOSIS — R1312 Dysphagia, oropharyngeal phase: Secondary | ICD-10-CM | POA: Diagnosis not present

## 2023-03-02 DIAGNOSIS — R296 Repeated falls: Secondary | ICD-10-CM | POA: Diagnosis not present

## 2023-03-02 DIAGNOSIS — R29712 NIHSS score 12: Secondary | ICD-10-CM | POA: Diagnosis not present

## 2023-03-02 DIAGNOSIS — I7389 Other specified peripheral vascular diseases: Secondary | ICD-10-CM | POA: Diagnosis not present

## 2023-03-02 DIAGNOSIS — I5020 Unspecified systolic (congestive) heart failure: Secondary | ICD-10-CM | POA: Diagnosis not present

## 2023-03-02 DIAGNOSIS — R638 Other symptoms and signs concerning food and fluid intake: Secondary | ICD-10-CM | POA: Diagnosis not present

## 2023-03-02 DIAGNOSIS — K649 Unspecified hemorrhoids: Secondary | ICD-10-CM | POA: Diagnosis not present

## 2023-03-02 DIAGNOSIS — C8310 Mantle cell lymphoma, unspecified site: Secondary | ICD-10-CM | POA: Diagnosis not present

## 2023-03-02 DIAGNOSIS — R578 Other shock: Secondary | ICD-10-CM | POA: Diagnosis not present

## 2023-03-02 DIAGNOSIS — I5022 Chronic systolic (congestive) heart failure: Secondary | ICD-10-CM | POA: Diagnosis not present

## 2023-03-02 DIAGNOSIS — G929 Unspecified toxic encephalopathy: Secondary | ICD-10-CM | POA: Diagnosis not present

## 2023-03-02 DIAGNOSIS — G9341 Metabolic encephalopathy: Secondary | ICD-10-CM | POA: Diagnosis not present

## 2023-03-02 DIAGNOSIS — D696 Thrombocytopenia, unspecified: Secondary | ICD-10-CM | POA: Diagnosis not present

## 2023-03-02 DIAGNOSIS — N3289 Other specified disorders of bladder: Secondary | ICD-10-CM | POA: Diagnosis not present

## 2023-03-02 DIAGNOSIS — E876 Hypokalemia: Secondary | ICD-10-CM | POA: Diagnosis not present

## 2023-03-02 DIAGNOSIS — R4182 Altered mental status, unspecified: Secondary | ICD-10-CM | POA: Diagnosis not present

## 2023-03-02 DIAGNOSIS — L97921 Non-pressure chronic ulcer of unspecified part of left lower leg limited to breakdown of skin: Secondary | ICD-10-CM | POA: Diagnosis not present

## 2023-03-02 DIAGNOSIS — K219 Gastro-esophageal reflux disease without esophagitis: Secondary | ICD-10-CM | POA: Diagnosis not present

## 2023-03-02 DIAGNOSIS — I63541 Cerebral infarction due to unspecified occlusion or stenosis of right cerebellar artery: Secondary | ICD-10-CM | POA: Diagnosis not present

## 2023-03-02 DIAGNOSIS — I951 Orthostatic hypotension: Secondary | ICD-10-CM | POA: Diagnosis not present

## 2023-03-02 DIAGNOSIS — I63542 Cerebral infarction due to unspecified occlusion or stenosis of left cerebellar artery: Secondary | ICD-10-CM | POA: Diagnosis not present

## 2023-03-02 DIAGNOSIS — G9340 Encephalopathy, unspecified: Secondary | ICD-10-CM | POA: Diagnosis not present

## 2023-03-02 DIAGNOSIS — M6281 Muscle weakness (generalized): Secondary | ICD-10-CM | POA: Diagnosis not present

## 2023-03-02 DIAGNOSIS — E559 Vitamin D deficiency, unspecified: Secondary | ICD-10-CM | POA: Diagnosis not present

## 2023-03-02 DIAGNOSIS — R279 Unspecified lack of coordination: Secondary | ICD-10-CM | POA: Diagnosis not present

## 2023-03-02 DIAGNOSIS — D709 Neutropenia, unspecified: Secondary | ICD-10-CM | POA: Diagnosis not present

## 2023-03-02 DIAGNOSIS — R571 Hypovolemic shock: Secondary | ICD-10-CM | POA: Diagnosis not present

## 2023-03-02 DIAGNOSIS — E1110 Type 2 diabetes mellitus with ketoacidosis without coma: Secondary | ICD-10-CM | POA: Diagnosis not present

## 2023-03-02 DIAGNOSIS — L97911 Non-pressure chronic ulcer of unspecified part of right lower leg limited to breakdown of skin: Secondary | ICD-10-CM | POA: Diagnosis not present

## 2023-03-02 DIAGNOSIS — R531 Weakness: Secondary | ICD-10-CM | POA: Diagnosis not present

## 2023-03-02 DIAGNOSIS — S12401D Unspecified nondisplaced fracture of fifth cervical vertebra, subsequent encounter for fracture with routine healing: Secondary | ICD-10-CM

## 2023-03-03 DIAGNOSIS — R4182 Altered mental status, unspecified: Secondary | ICD-10-CM | POA: Diagnosis not present

## 2023-03-03 DIAGNOSIS — Z789 Other specified health status: Secondary | ICD-10-CM | POA: Insufficient documentation

## 2023-03-03 DIAGNOSIS — G9340 Encephalopathy, unspecified: Secondary | ICD-10-CM | POA: Diagnosis not present

## 2023-03-03 DIAGNOSIS — R8281 Pyuria: Secondary | ICD-10-CM | POA: Insufficient documentation

## 2023-03-07 DIAGNOSIS — R7989 Other specified abnormal findings of blood chemistry: Secondary | ICD-10-CM | POA: Insufficient documentation

## 2023-03-07 DIAGNOSIS — E639 Nutritional deficiency, unspecified: Secondary | ICD-10-CM | POA: Insufficient documentation

## 2023-03-10 ENCOUNTER — Ambulatory Visit: Admission: RE | Admit: 2023-03-10 | Payer: Medicare Other | Source: Ambulatory Visit

## 2023-03-11 DIAGNOSIS — I7389 Other specified peripheral vascular diseases: Secondary | ICD-10-CM | POA: Insufficient documentation

## 2023-03-12 DIAGNOSIS — E1065 Type 1 diabetes mellitus with hyperglycemia: Secondary | ICD-10-CM | POA: Diagnosis not present

## 2023-03-12 DIAGNOSIS — Z803 Family history of malignant neoplasm of breast: Secondary | ICD-10-CM | POA: Diagnosis not present

## 2023-03-12 DIAGNOSIS — S12691A Other nondisplaced fracture of seventh cervical vertebra, initial encounter for closed fracture: Secondary | ICD-10-CM | POA: Diagnosis not present

## 2023-03-12 DIAGNOSIS — K529 Noninfective gastroenteritis and colitis, unspecified: Secondary | ICD-10-CM | POA: Diagnosis not present

## 2023-03-12 DIAGNOSIS — S12401D Unspecified nondisplaced fracture of fifth cervical vertebra, subsequent encounter for fracture with routine healing: Secondary | ICD-10-CM | POA: Diagnosis not present

## 2023-03-12 DIAGNOSIS — Z8673 Personal history of transient ischemic attack (TIA), and cerebral infarction without residual deficits: Secondary | ICD-10-CM | POA: Diagnosis not present

## 2023-03-12 DIAGNOSIS — K5900 Constipation, unspecified: Secondary | ICD-10-CM | POA: Diagnosis not present

## 2023-03-12 DIAGNOSIS — R601 Generalized edema: Secondary | ICD-10-CM | POA: Diagnosis not present

## 2023-03-12 DIAGNOSIS — I7389 Other specified peripheral vascular diseases: Secondary | ICD-10-CM | POA: Diagnosis not present

## 2023-03-12 DIAGNOSIS — E1029 Type 1 diabetes mellitus with other diabetic kidney complication: Secondary | ICD-10-CM | POA: Diagnosis not present

## 2023-03-12 DIAGNOSIS — K649 Unspecified hemorrhoids: Secondary | ICD-10-CM | POA: Diagnosis not present

## 2023-03-12 DIAGNOSIS — C8310 Mantle cell lymphoma, unspecified site: Secondary | ICD-10-CM | POA: Diagnosis not present

## 2023-03-12 DIAGNOSIS — L89322 Pressure ulcer of left buttock, stage 2: Secondary | ICD-10-CM | POA: Diagnosis not present

## 2023-03-12 DIAGNOSIS — Z87898 Personal history of other specified conditions: Secondary | ICD-10-CM | POA: Diagnosis not present

## 2023-03-12 DIAGNOSIS — E1010 Type 1 diabetes mellitus with ketoacidosis without coma: Secondary | ICD-10-CM | POA: Diagnosis not present

## 2023-03-12 DIAGNOSIS — J189 Pneumonia, unspecified organism: Secondary | ICD-10-CM | POA: Diagnosis not present

## 2023-03-12 DIAGNOSIS — C8318 Mantle cell lymphoma, lymph nodes of multiple sites: Secondary | ICD-10-CM | POA: Diagnosis not present

## 2023-03-12 DIAGNOSIS — M6281 Muscle weakness (generalized): Secondary | ICD-10-CM | POA: Diagnosis not present

## 2023-03-12 DIAGNOSIS — R58 Hemorrhage, not elsewhere classified: Secondary | ICD-10-CM | POA: Diagnosis not present

## 2023-03-12 DIAGNOSIS — Z905 Acquired absence of kidney: Secondary | ICD-10-CM | POA: Diagnosis not present

## 2023-03-12 DIAGNOSIS — E43 Unspecified severe protein-calorie malnutrition: Secondary | ICD-10-CM | POA: Diagnosis not present

## 2023-03-12 DIAGNOSIS — Z950 Presence of cardiac pacemaker: Secondary | ICD-10-CM | POA: Diagnosis not present

## 2023-03-12 DIAGNOSIS — I63441 Cerebral infarction due to embolism of right cerebellar artery: Secondary | ICD-10-CM | POA: Diagnosis not present

## 2023-03-12 DIAGNOSIS — R918 Other nonspecific abnormal finding of lung field: Secondary | ICD-10-CM | POA: Diagnosis not present

## 2023-03-12 DIAGNOSIS — Z87891 Personal history of nicotine dependence: Secondary | ICD-10-CM | POA: Diagnosis not present

## 2023-03-12 DIAGNOSIS — Z794 Long term (current) use of insulin: Secondary | ICD-10-CM | POA: Diagnosis not present

## 2023-03-12 DIAGNOSIS — E1159 Type 2 diabetes mellitus with other circulatory complications: Secondary | ICD-10-CM | POA: Diagnosis not present

## 2023-03-12 DIAGNOSIS — G8911 Acute pain due to trauma: Secondary | ICD-10-CM | POA: Diagnosis not present

## 2023-03-12 DIAGNOSIS — I63542 Cerebral infarction due to unspecified occlusion or stenosis of left cerebellar artery: Secondary | ICD-10-CM | POA: Diagnosis not present

## 2023-03-12 DIAGNOSIS — R1312 Dysphagia, oropharyngeal phase: Secondary | ICD-10-CM | POA: Diagnosis not present

## 2023-03-12 DIAGNOSIS — K219 Gastro-esophageal reflux disease without esophagitis: Secondary | ICD-10-CM | POA: Diagnosis not present

## 2023-03-12 DIAGNOSIS — R578 Other shock: Secondary | ICD-10-CM | POA: Diagnosis not present

## 2023-03-12 DIAGNOSIS — R600 Localized edema: Secondary | ICD-10-CM | POA: Diagnosis not present

## 2023-03-12 DIAGNOSIS — W19XXXA Unspecified fall, initial encounter: Secondary | ICD-10-CM | POA: Diagnosis not present

## 2023-03-12 DIAGNOSIS — R2681 Unsteadiness on feet: Secondary | ICD-10-CM | POA: Diagnosis not present

## 2023-03-12 DIAGNOSIS — E109 Type 1 diabetes mellitus without complications: Secondary | ICD-10-CM | POA: Diagnosis not present

## 2023-03-12 DIAGNOSIS — E119 Type 2 diabetes mellitus without complications: Secondary | ICD-10-CM | POA: Diagnosis not present

## 2023-03-12 DIAGNOSIS — D649 Anemia, unspecified: Secondary | ICD-10-CM | POA: Diagnosis not present

## 2023-03-12 DIAGNOSIS — E1110 Type 2 diabetes mellitus with ketoacidosis without coma: Secondary | ICD-10-CM | POA: Diagnosis not present

## 2023-03-12 DIAGNOSIS — Z66 Do not resuscitate: Secondary | ICD-10-CM | POA: Diagnosis not present

## 2023-03-12 DIAGNOSIS — D696 Thrombocytopenia, unspecified: Secondary | ICD-10-CM | POA: Diagnosis not present

## 2023-03-12 DIAGNOSIS — I5042 Chronic combined systolic (congestive) and diastolic (congestive) heart failure: Secondary | ICD-10-CM | POA: Diagnosis not present

## 2023-03-12 DIAGNOSIS — M8580 Other specified disorders of bone density and structure, unspecified site: Secondary | ICD-10-CM | POA: Diagnosis not present

## 2023-03-12 DIAGNOSIS — Z7982 Long term (current) use of aspirin: Secondary | ICD-10-CM | POA: Diagnosis not present

## 2023-03-12 DIAGNOSIS — S81801A Unspecified open wound, right lower leg, initial encounter: Secondary | ICD-10-CM | POA: Diagnosis not present

## 2023-03-12 DIAGNOSIS — G9341 Metabolic encephalopathy: Secondary | ICD-10-CM | POA: Diagnosis not present

## 2023-03-12 DIAGNOSIS — I152 Hypertension secondary to endocrine disorders: Secondary | ICD-10-CM | POA: Diagnosis not present

## 2023-03-12 DIAGNOSIS — D709 Neutropenia, unspecified: Secondary | ICD-10-CM | POA: Diagnosis not present

## 2023-03-12 DIAGNOSIS — N289 Disorder of kidney and ureter, unspecified: Secondary | ICD-10-CM | POA: Diagnosis not present

## 2023-03-12 DIAGNOSIS — C8315 Mantle cell lymphoma, lymph nodes of inguinal region and lower limb: Secondary | ICD-10-CM | POA: Diagnosis not present

## 2023-03-12 DIAGNOSIS — I5022 Chronic systolic (congestive) heart failure: Secondary | ICD-10-CM | POA: Diagnosis not present

## 2023-03-12 DIAGNOSIS — S12601D Unspecified nondisplaced fracture of seventh cervical vertebra, subsequent encounter for fracture with routine healing: Secondary | ICD-10-CM | POA: Diagnosis not present

## 2023-03-12 DIAGNOSIS — R571 Hypovolemic shock: Secondary | ICD-10-CM | POA: Diagnosis not present

## 2023-03-12 DIAGNOSIS — R0602 Shortness of breath: Secondary | ICD-10-CM | POA: Diagnosis not present

## 2023-03-12 DIAGNOSIS — Z1152 Encounter for screening for COVID-19: Secondary | ICD-10-CM | POA: Diagnosis not present

## 2023-03-12 DIAGNOSIS — M47812 Spondylosis without myelopathy or radiculopathy, cervical region: Secondary | ICD-10-CM | POA: Diagnosis not present

## 2023-03-12 DIAGNOSIS — I872 Venous insufficiency (chronic) (peripheral): Secondary | ICD-10-CM | POA: Diagnosis not present

## 2023-03-12 DIAGNOSIS — D61818 Other pancytopenia: Secondary | ICD-10-CM | POA: Diagnosis not present

## 2023-03-12 DIAGNOSIS — M40209 Unspecified kyphosis, site unspecified: Secondary | ICD-10-CM | POA: Diagnosis not present

## 2023-03-12 DIAGNOSIS — R112 Nausea with vomiting, unspecified: Secondary | ICD-10-CM | POA: Diagnosis not present

## 2023-03-12 DIAGNOSIS — E039 Hypothyroidism, unspecified: Secondary | ICD-10-CM | POA: Diagnosis not present

## 2023-03-12 DIAGNOSIS — L89312 Pressure ulcer of right buttock, stage 2: Secondary | ICD-10-CM | POA: Diagnosis not present

## 2023-03-12 DIAGNOSIS — D519 Vitamin B12 deficiency anemia, unspecified: Secondary | ICD-10-CM | POA: Diagnosis not present

## 2023-03-12 DIAGNOSIS — Z79899 Other long term (current) drug therapy: Secondary | ICD-10-CM | POA: Diagnosis not present

## 2023-03-12 DIAGNOSIS — Z9641 Presence of insulin pump (external) (internal): Secondary | ICD-10-CM | POA: Diagnosis not present

## 2023-03-12 DIAGNOSIS — I959 Hypotension, unspecified: Secondary | ICD-10-CM | POA: Diagnosis not present

## 2023-03-12 DIAGNOSIS — R296 Repeated falls: Secondary | ICD-10-CM | POA: Diagnosis not present

## 2023-03-12 DIAGNOSIS — R197 Diarrhea, unspecified: Secondary | ICD-10-CM | POA: Diagnosis not present

## 2023-03-12 DIAGNOSIS — G909 Disorder of the autonomic nervous system, unspecified: Secondary | ICD-10-CM | POA: Diagnosis not present

## 2023-03-12 DIAGNOSIS — J811 Chronic pulmonary edema: Secondary | ICD-10-CM | POA: Diagnosis not present

## 2023-03-12 DIAGNOSIS — R490 Dysphonia: Secondary | ICD-10-CM | POA: Diagnosis not present

## 2023-03-12 DIAGNOSIS — G928 Other toxic encephalopathy: Secondary | ICD-10-CM | POA: Diagnosis not present

## 2023-03-12 DIAGNOSIS — E876 Hypokalemia: Secondary | ICD-10-CM | POA: Diagnosis not present

## 2023-03-12 DIAGNOSIS — J90 Pleural effusion, not elsewhere classified: Secondary | ICD-10-CM | POA: Diagnosis not present

## 2023-03-12 DIAGNOSIS — R739 Hyperglycemia, unspecified: Secondary | ICD-10-CM | POA: Diagnosis not present

## 2023-03-12 DIAGNOSIS — R4182 Altered mental status, unspecified: Secondary | ICD-10-CM | POA: Diagnosis not present

## 2023-03-12 DIAGNOSIS — R279 Unspecified lack of coordination: Secondary | ICD-10-CM | POA: Diagnosis not present

## 2023-03-12 DIAGNOSIS — N179 Acute kidney failure, unspecified: Secondary | ICD-10-CM | POA: Diagnosis not present

## 2023-03-12 DIAGNOSIS — R5381 Other malaise: Secondary | ICD-10-CM | POA: Diagnosis not present

## 2023-03-12 DIAGNOSIS — I110 Hypertensive heart disease with heart failure: Secondary | ICD-10-CM | POA: Diagnosis not present

## 2023-03-12 DIAGNOSIS — E785 Hyperlipidemia, unspecified: Secondary | ICD-10-CM | POA: Diagnosis not present

## 2023-03-12 DIAGNOSIS — D72819 Decreased white blood cell count, unspecified: Secondary | ICD-10-CM | POA: Diagnosis not present

## 2023-03-12 DIAGNOSIS — I951 Orthostatic hypotension: Secondary | ICD-10-CM | POA: Diagnosis not present

## 2023-03-12 DIAGNOSIS — I639 Cerebral infarction, unspecified: Secondary | ICD-10-CM | POA: Diagnosis not present

## 2023-03-12 DIAGNOSIS — L97929 Non-pressure chronic ulcer of unspecified part of left lower leg with unspecified severity: Secondary | ICD-10-CM | POA: Diagnosis not present

## 2023-03-12 DIAGNOSIS — R579 Shock, unspecified: Secondary | ICD-10-CM | POA: Diagnosis not present

## 2023-03-12 DIAGNOSIS — S81802A Unspecified open wound, left lower leg, initial encounter: Secondary | ICD-10-CM | POA: Diagnosis not present

## 2023-03-12 DIAGNOSIS — Z807 Family history of other malignant neoplasms of lymphoid, hematopoietic and related tissues: Secondary | ICD-10-CM | POA: Diagnosis not present

## 2023-03-12 DIAGNOSIS — E10649 Type 1 diabetes mellitus with hypoglycemia without coma: Secondary | ICD-10-CM | POA: Diagnosis not present

## 2023-03-12 DIAGNOSIS — R531 Weakness: Secondary | ICD-10-CM | POA: Diagnosis not present

## 2023-03-12 DIAGNOSIS — S81811A Laceration without foreign body, right lower leg, initial encounter: Secondary | ICD-10-CM | POA: Diagnosis not present

## 2023-03-12 DIAGNOSIS — N182 Chronic kidney disease, stage 2 (mild): Secondary | ICD-10-CM | POA: Diagnosis not present

## 2023-03-12 DIAGNOSIS — E1165 Type 2 diabetes mellitus with hyperglycemia: Secondary | ICD-10-CM | POA: Diagnosis not present

## 2023-03-12 DIAGNOSIS — S12600D Unspecified displaced fracture of seventh cervical vertebra, subsequent encounter for fracture with routine healing: Secondary | ICD-10-CM | POA: Diagnosis not present

## 2023-03-12 DIAGNOSIS — I10 Essential (primary) hypertension: Secondary | ICD-10-CM | POA: Diagnosis not present

## 2023-03-12 DIAGNOSIS — E1069 Type 1 diabetes mellitus with other specified complication: Secondary | ICD-10-CM | POA: Diagnosis not present

## 2023-03-12 DIAGNOSIS — R499 Unspecified voice and resonance disorder: Secondary | ICD-10-CM | POA: Diagnosis not present

## 2023-03-12 DIAGNOSIS — L97919 Non-pressure chronic ulcer of unspecified part of right lower leg with unspecified severity: Secondary | ICD-10-CM | POA: Diagnosis not present

## 2023-03-12 DIAGNOSIS — D529 Folate deficiency anemia, unspecified: Secondary | ICD-10-CM | POA: Diagnosis not present

## 2023-03-12 DIAGNOSIS — R262 Difficulty in walking, not elsewhere classified: Secondary | ICD-10-CM | POA: Diagnosis not present

## 2023-03-12 DIAGNOSIS — F32A Depression, unspecified: Secondary | ICD-10-CM | POA: Diagnosis not present

## 2023-03-12 DIAGNOSIS — E559 Vitamin D deficiency, unspecified: Secondary | ICD-10-CM | POA: Diagnosis not present

## 2023-03-12 DIAGNOSIS — R1111 Vomiting without nausea: Secondary | ICD-10-CM | POA: Diagnosis not present

## 2023-03-15 ENCOUNTER — Ambulatory Visit: Payer: 59 | Admitting: Gastroenterology

## 2023-03-15 DIAGNOSIS — R579 Shock, unspecified: Secondary | ICD-10-CM | POA: Diagnosis not present

## 2023-03-16 ENCOUNTER — Ambulatory Visit: Payer: 59 | Admitting: Oncology

## 2023-03-16 ENCOUNTER — Other Ambulatory Visit: Payer: 59

## 2023-03-17 DIAGNOSIS — F32A Depression, unspecified: Secondary | ICD-10-CM | POA: Diagnosis not present

## 2023-03-18 ENCOUNTER — Telehealth: Payer: Self-pay | Admitting: *Deleted

## 2023-03-18 ENCOUNTER — Ambulatory Visit (INDEPENDENT_AMBULATORY_CARE_PROVIDER_SITE_OTHER): Payer: Medicare Other | Admitting: Podiatry

## 2023-03-18 DIAGNOSIS — I5022 Chronic systolic (congestive) heart failure: Secondary | ICD-10-CM | POA: Diagnosis not present

## 2023-03-18 DIAGNOSIS — I7389 Other specified peripheral vascular diseases: Secondary | ICD-10-CM | POA: Diagnosis not present

## 2023-03-18 DIAGNOSIS — I951 Orthostatic hypotension: Secondary | ICD-10-CM | POA: Diagnosis not present

## 2023-03-18 DIAGNOSIS — S12601D Unspecified nondisplaced fracture of seventh cervical vertebra, subsequent encounter for fracture with routine healing: Secondary | ICD-10-CM | POA: Diagnosis not present

## 2023-03-18 DIAGNOSIS — E1010 Type 1 diabetes mellitus with ketoacidosis without coma: Secondary | ICD-10-CM | POA: Diagnosis not present

## 2023-03-18 DIAGNOSIS — I639 Cerebral infarction, unspecified: Secondary | ICD-10-CM | POA: Diagnosis not present

## 2023-03-18 DIAGNOSIS — Z91199 Patient's noncompliance with other medical treatment and regimen due to unspecified reason: Secondary | ICD-10-CM

## 2023-03-18 DIAGNOSIS — R579 Shock, unspecified: Secondary | ICD-10-CM | POA: Diagnosis not present

## 2023-03-18 DIAGNOSIS — L89312 Pressure ulcer of right buttock, stage 2: Secondary | ICD-10-CM | POA: Diagnosis not present

## 2023-03-18 DIAGNOSIS — D61818 Other pancytopenia: Secondary | ICD-10-CM | POA: Diagnosis not present

## 2023-03-18 DIAGNOSIS — L89322 Pressure ulcer of left buttock, stage 2: Secondary | ICD-10-CM | POA: Diagnosis not present

## 2023-03-18 NOTE — Telephone Encounter (Signed)
CAll returned and advised per Dr Orlie Dakin to watch for fevers and notify us if he develops ne. She confirmed his appointment 6/14

## 2023-03-18 NOTE — Telephone Encounter (Addendum)
Kaler,PA at SNF reporting that patient WBC is 1.6at lab draw the other day and they have placed him on neutropenic precautions and are askig iif anything else needs to be done for him or not. He does have a follow up appointment here 6/14. Please advise

## 2023-03-18 NOTE — Progress Notes (Signed)
1. No-show for appointment    Patient recently hospitalized.

## 2023-03-19 DIAGNOSIS — D61818 Other pancytopenia: Secondary | ICD-10-CM | POA: Diagnosis not present

## 2023-03-19 DIAGNOSIS — F32A Depression, unspecified: Secondary | ICD-10-CM | POA: Diagnosis not present

## 2023-03-19 DIAGNOSIS — C8310 Mantle cell lymphoma, unspecified site: Secondary | ICD-10-CM | POA: Diagnosis not present

## 2023-03-19 DIAGNOSIS — S81811A Laceration without foreign body, right lower leg, initial encounter: Secondary | ICD-10-CM | POA: Diagnosis not present

## 2023-03-23 DIAGNOSIS — E109 Type 1 diabetes mellitus without complications: Secondary | ICD-10-CM | POA: Diagnosis not present

## 2023-03-23 DIAGNOSIS — R600 Localized edema: Secondary | ICD-10-CM | POA: Diagnosis not present

## 2023-03-23 DIAGNOSIS — I5022 Chronic systolic (congestive) heart failure: Secondary | ICD-10-CM | POA: Diagnosis not present

## 2023-03-25 DIAGNOSIS — L89312 Pressure ulcer of right buttock, stage 2: Secondary | ICD-10-CM | POA: Diagnosis not present

## 2023-03-25 DIAGNOSIS — L89322 Pressure ulcer of left buttock, stage 2: Secondary | ICD-10-CM | POA: Diagnosis not present

## 2023-03-26 ENCOUNTER — Inpatient Hospital Stay: Payer: Medicare Other

## 2023-03-26 ENCOUNTER — Inpatient Hospital Stay: Payer: Medicare Other | Admitting: Oncology

## 2023-03-31 ENCOUNTER — Ambulatory Visit
Admission: RE | Admit: 2023-03-31 | Discharge: 2023-03-31 | Disposition: A | Discharge status: Home / Self Care | Payer: Medicare Other | Source: Ambulatory Visit | Attending: Oncology | Admitting: Oncology

## 2023-03-31 DIAGNOSIS — R601 Generalized edema: Secondary | ICD-10-CM | POA: Diagnosis not present

## 2023-03-31 DIAGNOSIS — C8318 Mantle cell lymphoma, lymph nodes of multiple sites: Secondary | ICD-10-CM | POA: Insufficient documentation

## 2023-03-31 DIAGNOSIS — J90 Pleural effusion, not elsewhere classified: Secondary | ICD-10-CM | POA: Diagnosis not present

## 2023-03-31 MED ORDER — IOHEXOL 300 MG/ML  SOLN
100.0000 mL | Freq: Once | INTRAMUSCULAR | Status: AC | PRN
  Administered 2023-03-31: 100 mL via INTRAVENOUS

## 2023-03-31 MED ORDER — HEPARIN SOD (PORK) LOCK FLUSH 100 UNIT/ML IV SOLN
INTRAVENOUS | Status: AC
  Filled 2023-03-31: qty 5

## 2023-03-31 MED ORDER — HEPARIN SOD (PORK) LOCK FLUSH 100 UNIT/ML IV SOLN
500.0000 [IU] | Freq: Once | INTRAVENOUS | Status: AC
  Administered 2023-03-31: 500 [IU] via INTRAVENOUS
  Filled 2023-03-31: qty 5

## 2023-04-09 ENCOUNTER — Ambulatory Visit (INDEPENDENT_AMBULATORY_CARE_PROVIDER_SITE_OTHER): Payer: Medicare Other | Admitting: Orthopedic Surgery

## 2023-04-09 ENCOUNTER — Encounter: Payer: Self-pay | Admitting: Orthopedic Surgery

## 2023-04-09 ENCOUNTER — Encounter: Payer: 59 | Admitting: Orthopedic Surgery

## 2023-04-09 VITALS — BP 115/70 | Ht 69.0 in | Wt 201.0 lb

## 2023-04-09 DIAGNOSIS — S12691A Other nondisplaced fracture of seventh cervical vertebra, initial encounter for closed fracture: Secondary | ICD-10-CM

## 2023-04-09 DIAGNOSIS — W19XXXA Unspecified fall, initial encounter: Secondary | ICD-10-CM

## 2023-04-09 NOTE — Progress Notes (Signed)
Referring Physician:  Melina Modena, MD [address] [place],  [place] [address]  Primary Physician:  Dale Starrucca, MD  History of Present Illness: 04/09/2023 [name] has a history of ichemic stroke, IDDM, mantle cell lymphoma, orthostatic hypotension, hypothyroidism, venous stasis ulcer, acrocyanosis, heart failure, and GERD.   He has a pacemaker.   ED spoke with Dr. Madaline Brilliant about him when he had CT s/p fall with reported widening of C6-C7 disk space with prevertebral swelling. Cannot have MRI due to pacemaker. He was to wear cervical collar when out of bed and is here for follow up.   He was then admitted to [facility] in May for shock that was most likely hypovolemic. Also found to have ischemic stroke.   Per discharge summary from [facility], he had cervical xrays on 03/11/23 and ortho recommended that he remove his cervical collar and discontinue cervical precautions.   He has minimal neck pain with no arm pain. Feels some tightness in the back of the neck when laying down. No numbness, tingling, or weakness. No leg pain. No numbness, tingling, or weakness. As above, he has not been wearing his collar.    Review of Systems:  A 10 point review of systems is negative, except for the pertinent positives and negatives detailed in the HPI.  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    Gastroparesis    Graves disease    Hypercholesterolemia    Hypertension    Mantle cell lymphoma (HCC) 07/2014   Peripheral neuropathy    Shingles     Past Surgical History: Past Surgical History:  Procedure Laterality Date   CHOLECYSTECTOMY     COLONOSCOPY WITH PROPOFOL N/A 02/04/2023   Procedure: COLONOSCOPY WITH PROPOFOL;  Surgeon: [name], MD;  Location: [facility] ENDOSCOPY;  Service: Gastroenterology;  Laterality: N/A;    ESOPHAGOGASTRODUODENOSCOPY (EGD) WITH PROPOFOL N/A 01/18/2023   Procedure: ESOPHAGOGASTRODUODENOSCOPY (EGD) WITH PROPOFOL;  Surgeon: Jaynie Collins, DO;  Location: [facility] ENDOSCOPY;  Service: Gastroenterology;  Laterality: N/A;   NEPHRECTOMY     right   PACEMAKER INSERTION N/A 12/07/2017   Procedure: INSERTION PACEMAKER DUAL CHAMBER INITIAL INSERT;  Surgeon: Marcina Millard, MD;  Location: [facility] ORS;  Service: Cardiovascular;  Laterality: N/A;   PORTA CATH INSERTION N/A 11/03/2017   Procedure: PORTA CATH INSERTION;  Surgeon: Renford Dills, MD;  Location: [facility] INVASIVE CV LAB;  Service: Cardiovascular;  Laterality: N/A;    Allergies: Allergies as of 04/09/2023 - Review Complete 02/06/2023  Allergen Reaction Noted   Rofecoxib Nausea Only 04/09/2015    Medications: Outpatient Encounter Medications as of 04/09/2023  Medication Sig   Cholecalciferol (VITAMIN D3) 50 MCG (2000 UT) CAPS Take 2,000 Units by mouth daily.   cholestyramine (QUESTRAN) 4 g packet Take 1 packet (4 g total) by mouth 2 (two) times daily.   clotrimazole-betamethasone (LOTRISONE) cream Apply topically 2 (two) times daily.   feeding supplement, GLUCERNA SHAKE, (GLUCERNA SHAKE) LIQD Take 237 mLs by mouth 2 (two) times daily between meals.   ferrous sulfate 325 (65 FE) MG tablet Take 325 mg by mouth daily with breakfast.   FLUoxetine (PROZAC) 20 MG tablet Take 1 tablet (20 mg total) by mouth daily.   folic acid (FOLVITE) 1 MG tablet Take 1 tablet (1 mg total) by mouth daily.   furosemide (LASIX) 40 MG  tablet Take 2 tablets in the morning, and 1 tablet in the evening.   glucose 4 GM chewable tablet Chew 1 tablet (4 g total) by mouth once as needed for low blood sugar.   insulin aspart (NOVOLOG) 100 UNIT/ML injection Inject 6 Units into the skin 3 (three) times daily with meals. (Patient taking differently: Inject subcutaneously 3 times daily with meals per sliding scale. 100-150; 0 units, 151-200; 3 units, 201-250; 5  units, 251-300; 7 units, 301-350; 9 units, 351-400; 11 units, 401-450; 13 units. Notify MD if blood sugar is greater than 450.)   insulin glargine-yfgn (SEMGLEE, YFGN,) 100 UNIT/ML Pen Inject 4 Units into the skin at bedtime.   lactobacillus (FLORANEX/LACTINEX) PACK Take 1 packet (1 g total) by mouth 3 (three) times daily with meals.   levothyroxine (SYNTHROID) 50 MCG tablet Take 1 tablet (50 mcg total) by mouth daily before breakfast.   loperamide (IMODIUM) 2 MG capsule Take 1 capsule (2 mg total) by mouth as needed for diarrhea or loose stools.   midodrine (PROAMATINE) 10 MG tablet Take 1 tablet (10 mg total) by mouth 3 (three) times daily with meals.   Na Sulfate-K Sulfate-Mg Sulf 17.5-3.13-1.6 GM/177ML SOLN Take by mouth. Take as directed.   nystatin cream (MYCOSTATIN) APPLY TO AFFECTED AREA TWICE A DAY (Patient taking differently: Apply topically twice daily for rash. Apply once every morning shift and once every evening.)   pantoprazole (PROTONIX) 40 MG tablet Take 1 tablet (40 mg total) by mouth 2 (two) times daily before a meal.   potassium chloride SA (KLOR-CON M) 20 MEQ tablet Take 1 tablet (20 mEq total) by mouth 2 (two) times daily.   pravastatin (PRAVACHOL) 40 MG tablet TAKE 1 TABLET BY MOUTH EVERY DAY   promethazine (PHENERGAN) 12.5 MG tablet Take 12.5 mg by mouth every 6 (six) hours as needed for nausea or vomiting.   vitamin B-12 (CYANOCOBALAMIN) 1000 MCG tablet Take 1,000 mcg by mouth daily.   Facility-Administered Encounter Medications as of 04/09/2023  Medication   heparin lock flush 100 unit/mL   sodium chloride flush (NS) 0.9 % injection 10 mL   sodium chloride flush (NS) 0.9 % injection 10 mL    Social History: Social History   Tobacco Use   Smoking status: Former    Types: Cigarettes    Passive exposure: Past   Smokeless tobacco: Current    Types: Chew  Vaping Use   Vaping Use: Never used  Substance Use Topics   Alcohol use: Yes    Alcohol/week: 3.0 standard  drinks of alcohol    Types: 3 Cans of beer per week    Comment: nightly   Drug use: Never    Family Medical History: Family History  Problem Relation Age of Onset   Breast cancer Mother    Diabetes Father    Cancer Father        Lymphoma   Alcohol abuse Maternal Uncle    Diabetes Sister    Heart disease Other        grandfather    Physical Examination: There were no vitals filed for this visit.  He is in a stretcher. He is on O2 via [place].   General: Patient is well developed, well nourished, calm, collected, and in no apparent distress. Attention to examination is appropriate.  Respiratory: Patient is breathing without any difficulty.   NEUROLOGICAL:     Awake, alert, oriented to person, place, and time.  Speech is clear and fluent. Fund of knowledge is  appropriate.   Cranial Nerves: Pupils equal round and reactive to light.  Facial tone is symmetric.    No posterior cervical tenderness.     Strength: Side Biceps Triceps Deltoid Interossei Grip Wrist Ext. Wrist Flex.  R 5 5 5 5 5 5 5   L 5 5 5 5 5 5 5    Side Iliopsoas Quads Hamstring PF DF EHL  R 5 5 5 5 5 5   L 5 5 5 5 5 5    Reflexes are 1+ and symmetric at the biceps, brachioradialis.    Hoffman's is absent.   Bilateral upper and lower extremity sensation is intact to light touch.     Gait not tested.   Medical Decision Making  Imaging: CT of cervical spine dated 02/05/23:  CT CERVICAL SPINE FINDINGS   Alignment: Unchanged trace anterolisthesis of C3 on C4 and C4 on C5.   Skull base and vertebrae: Distraction injury at C6-C7, with widening of the previously anteriorly fused disc space (series 6, image 26) and fracture through the right anterior superior C7 endplate (series 7, image 32 and series 6, image 20). The posterior aspect of the disc space appears unchanged. Unchanged partial osseous fusion across the C3-C4 disc space posteriorly.   Soft tissues and spinal canal: Prevertebral fluid anterior to  C4-T2. (Series 3, image 68). No visible canal hematoma.   Disc levels: Degenerative changes in the cervical spine. No significant spinal canal stenosis.   Upper chest: Small right pleural effusion. No focal pulmonary opacity.   IMPRESSION: 1. Distraction injury at C6-C7, with fracture through the right anterior superior C7 endplate and widening of the anterior aspect of the disc space at C6-C7. Prevertebral fluid anterior to C4-T2. 2. No acute intracranial process. 3. Small right pleural effusion.   These findings were discussed by telephone on 02/05/2023 at 8:21 pm with provider PATRICK ROBINSON .     Electronically Signed   By: Wiliam Ke M.D.   On: 02/05/2023 20:24   Cervical xrays dated 03/05/23:  FINDINGS: Limited exam due to significant kyphotic curvature of the cervical spine with poor visualization of the spine on AP view. Alignment of the craniocervical junction and atlantoaxial joints with associated degenerative changes. Redemonstrated unchanged mild anterior wedge compression deformity of the C7 vertebral body. Vertebral body heights are otherwise maintained. Multilevel degenerative disc space narrowing with uncovertebral and facet DJD, most prominent at C5-6 and C6-7. Unchanged mild grade 1 anterolisthesis of C3 on C4 and C4 on C5. Vertebral bodies are otherwise normally aligned on lateral view. Unchanged reversal of the cervical lordosis. Prevertebral soft tissues are within normal limits.  IMPRESSION: --Grossly unchanged radiograph of the cervical spine with redemonstrated mild anterior wedge compression deformity of the C7 vertebral body and multilevel degenerative changes, detailed above. Exam End: 03/05/23 18:43   Specimen Collected: 03/06/23 09:09 Last Resulted: 03/06/23 09:15  Received From: [facility]  Result Received: 03/22/23 14:44    MRI of cervical spine dated 03/05/23:  FINDINGS: Severely degraded MRI cervical spine due to patient condition and  motion.  Within the above confines, there is increased T2/STIR signal within the intervertebral disc space C6-C7 associated with anterior disc space widening. There is also mildly increased STIR signal in the superior endplate of C7 endplate of C7. There is a small amount of prevertebral fluid and/or edema anterior to the C6 vertebral body. No definite spinal cord signal abnormality, within the limitations of the exam.  No severe listhesis. No severe spinal canal narrowing. Evaluation of  the neural foramen is extremely limited.  Evaluation of the paraspinal muscles is extremely limited.  IMPRESSION: Severely motion degraded examination. There is abnormal signal and anterior widening of the C6-C7 intervertebral disc space, concerning for anterior longitudinal ligament rupture at this level. Exam End: 03/05/23 11:15   Specimen Collected: 03/05/23 11:35 Last Resulted: 03/05/23 14:14  Received From: [facility]  Result Received: 03/22/23 14:44   I have personally reviewed the images and agree with the above interpretation. MRI poor quality as above.   Imaging from [facility] reviewed on the portal- will powershare to get these images into his chart.   Assessment and Plan: Mr. Ensor is a pleasant 77 y.o. male has history of fall on 02/05/23 with reported widening of C6-C7 disk space with prevertebral swelling. He was to wear cervical collar when out of bed.   He was then admitted to [facility] in May for shock that was most likely hypovolemic. Also found to have ischemic stroke.   Per discharge summary from [facility], he had cervical xrays on 03/11/23 and ortho recommended that he remove his cervical collar and discontinue cervical precautions.   He has no current neck pain. He feels some tightness in the back of the neck when laying down. No numbness, tingling, or weakness. No arm or leg pain.   Above imaging reviewed from [facility].   Treatment options discussed with patient and following plan made:   - He came  to appointment via EMS on stretcher, but they are not authorized to take him for xrays.  - Cervical xrays with flexion and extension ordered. Written on paperwork for SNF and will call them to follow up.  - Once I get xrays back, will call him with recommendations. His SNF is 45 minutes away.   Patient reviewed with Dr. Myer Haff after his visit. He agrees with above xrays to check for instability. No further recommendations at this time.   I spent a total of 35 minutes in face-to-face and non-face-to-face activities related to this patient's care today including review of outside records, review of imaging, review of symptoms, physical exam, discussion of differential diagnosis, discussion of treatment options, and documentation.   Thank you for involving me in the care of this patient.   Drake Leach PA-C Dept. of Neurosurgery

## 2023-04-09 NOTE — Progress Notes (Signed)
error 

## 2023-04-12 ENCOUNTER — Other Ambulatory Visit: Payer: Self-pay

## 2023-04-12 ENCOUNTER — Inpatient Hospital Stay
Admission: RE | Admit: 2023-04-12 | Discharge: 2023-04-12 | Disposition: A | Discharge status: Home / Self Care | Payer: Self-pay | Source: Ambulatory Visit | Attending: Orthopedic Surgery | Admitting: Orthopedic Surgery

## 2023-04-12 DIAGNOSIS — E109 Type 1 diabetes mellitus without complications: Secondary | ICD-10-CM | POA: Diagnosis not present

## 2023-04-12 DIAGNOSIS — Z049 Encounter for examination and observation for unspecified reason: Secondary | ICD-10-CM

## 2023-04-12 DIAGNOSIS — S12601D Unspecified nondisplaced fracture of seventh cervical vertebra, subsequent encounter for fracture with routine healing: Secondary | ICD-10-CM | POA: Diagnosis not present

## 2023-04-12 DIAGNOSIS — R600 Localized edema: Secondary | ICD-10-CM | POA: Diagnosis not present

## 2023-04-14 DIAGNOSIS — E785 Hyperlipidemia, unspecified: Secondary | ICD-10-CM | POA: Diagnosis not present

## 2023-04-14 DIAGNOSIS — C8310 Mantle cell lymphoma, unspecified site: Secondary | ICD-10-CM | POA: Diagnosis not present

## 2023-04-14 DIAGNOSIS — S12601D Unspecified nondisplaced fracture of seventh cervical vertebra, subsequent encounter for fracture with routine healing: Secondary | ICD-10-CM | POA: Diagnosis not present

## 2023-04-14 DIAGNOSIS — E109 Type 1 diabetes mellitus without complications: Secondary | ICD-10-CM | POA: Diagnosis not present

## 2023-04-16 ENCOUNTER — Other Ambulatory Visit: Payer: Self-pay

## 2023-04-16 DIAGNOSIS — C8318 Mantle cell lymphoma, lymph nodes of multiple sites: Secondary | ICD-10-CM

## 2023-04-19 ENCOUNTER — Encounter: Payer: Self-pay | Admitting: Oncology

## 2023-04-19 ENCOUNTER — Inpatient Hospital Stay: Payer: Medicare Other | Admitting: Oncology

## 2023-04-19 ENCOUNTER — Inpatient Hospital Stay: Payer: Medicare Other

## 2023-04-20 DIAGNOSIS — I639 Cerebral infarction, unspecified: Secondary | ICD-10-CM | POA: Diagnosis not present

## 2023-04-20 DIAGNOSIS — E109 Type 1 diabetes mellitus without complications: Secondary | ICD-10-CM | POA: Diagnosis not present

## 2023-04-20 DIAGNOSIS — C8310 Mantle cell lymphoma, unspecified site: Secondary | ICD-10-CM | POA: Diagnosis not present

## 2023-04-20 DIAGNOSIS — R58 Hemorrhage, not elsewhere classified: Secondary | ICD-10-CM | POA: Diagnosis not present

## 2023-04-21 DIAGNOSIS — E876 Hypokalemia: Secondary | ICD-10-CM | POA: Diagnosis not present

## 2023-04-21 DIAGNOSIS — C8310 Mantle cell lymphoma, unspecified site: Secondary | ICD-10-CM | POA: Diagnosis not present

## 2023-04-21 DIAGNOSIS — R600 Localized edema: Secondary | ICD-10-CM | POA: Diagnosis not present

## 2023-04-21 DIAGNOSIS — I5022 Chronic systolic (congestive) heart failure: Secondary | ICD-10-CM | POA: Diagnosis not present

## 2023-04-22 DIAGNOSIS — S81802A Unspecified open wound, left lower leg, initial encounter: Secondary | ICD-10-CM | POA: Diagnosis not present

## 2023-04-22 DIAGNOSIS — S81801A Unspecified open wound, right lower leg, initial encounter: Secondary | ICD-10-CM | POA: Diagnosis not present

## 2023-04-26 DIAGNOSIS — E1029 Type 1 diabetes mellitus with other diabetic kidney complication: Secondary | ICD-10-CM | POA: Diagnosis not present

## 2023-04-26 DIAGNOSIS — N182 Chronic kidney disease, stage 2 (mild): Secondary | ICD-10-CM | POA: Diagnosis not present

## 2023-04-27 DIAGNOSIS — E10649 Type 1 diabetes mellitus with hypoglycemia without coma: Secondary | ICD-10-CM | POA: Diagnosis not present

## 2023-04-27 DIAGNOSIS — E1069 Type 1 diabetes mellitus with other specified complication: Secondary | ICD-10-CM | POA: Diagnosis not present

## 2023-04-27 DIAGNOSIS — E1159 Type 2 diabetes mellitus with other circulatory complications: Secondary | ICD-10-CM | POA: Diagnosis not present

## 2023-04-27 DIAGNOSIS — I152 Hypertension secondary to endocrine disorders: Secondary | ICD-10-CM | POA: Diagnosis not present

## 2023-04-27 DIAGNOSIS — E785 Hyperlipidemia, unspecified: Secondary | ICD-10-CM | POA: Diagnosis not present

## 2023-04-28 DIAGNOSIS — I639 Cerebral infarction, unspecified: Secondary | ICD-10-CM | POA: Diagnosis not present

## 2023-04-28 DIAGNOSIS — C8310 Mantle cell lymphoma, unspecified site: Secondary | ICD-10-CM | POA: Diagnosis not present

## 2023-04-28 DIAGNOSIS — I5022 Chronic systolic (congestive) heart failure: Secondary | ICD-10-CM | POA: Diagnosis not present

## 2023-04-28 DIAGNOSIS — E109 Type 1 diabetes mellitus without complications: Secondary | ICD-10-CM | POA: Diagnosis not present

## 2023-04-28 DIAGNOSIS — F32A Depression, unspecified: Secondary | ICD-10-CM | POA: Diagnosis not present

## 2023-04-29 ENCOUNTER — Inpatient Hospital Stay: Payer: Medicare Other | Attending: Oncology

## 2023-04-29 ENCOUNTER — Encounter: Payer: Self-pay | Admitting: Oncology

## 2023-04-29 ENCOUNTER — Inpatient Hospital Stay: Payer: Medicare Other | Admitting: Oncology

## 2023-04-29 VITALS — BP 107/62 | HR 76 | Resp 18

## 2023-04-29 DIAGNOSIS — Z807 Family history of other malignant neoplasms of lymphoid, hematopoietic and related tissues: Secondary | ICD-10-CM | POA: Diagnosis not present

## 2023-04-29 DIAGNOSIS — C8315 Mantle cell lymphoma, lymph nodes of inguinal region and lower limb: Secondary | ICD-10-CM | POA: Insufficient documentation

## 2023-04-29 DIAGNOSIS — D72819 Decreased white blood cell count, unspecified: Secondary | ICD-10-CM | POA: Diagnosis not present

## 2023-04-29 DIAGNOSIS — D696 Thrombocytopenia, unspecified: Secondary | ICD-10-CM | POA: Diagnosis not present

## 2023-04-29 DIAGNOSIS — Z803 Family history of malignant neoplasm of breast: Secondary | ICD-10-CM | POA: Insufficient documentation

## 2023-04-29 DIAGNOSIS — Z87891 Personal history of nicotine dependence: Secondary | ICD-10-CM | POA: Diagnosis not present

## 2023-04-29 DIAGNOSIS — N289 Disorder of kidney and ureter, unspecified: Secondary | ICD-10-CM | POA: Insufficient documentation

## 2023-04-29 DIAGNOSIS — C8318 Mantle cell lymphoma, lymph nodes of multiple sites: Secondary | ICD-10-CM

## 2023-04-29 DIAGNOSIS — D649 Anemia, unspecified: Secondary | ICD-10-CM | POA: Insufficient documentation

## 2023-04-29 DIAGNOSIS — E1165 Type 2 diabetes mellitus with hyperglycemia: Secondary | ICD-10-CM | POA: Insufficient documentation

## 2023-04-29 DIAGNOSIS — Z8673 Personal history of transient ischemic attack (TIA), and cerebral infarction without residual deficits: Secondary | ICD-10-CM | POA: Insufficient documentation

## 2023-04-29 DIAGNOSIS — S81802A Unspecified open wound, left lower leg, initial encounter: Secondary | ICD-10-CM | POA: Diagnosis not present

## 2023-04-29 DIAGNOSIS — Z794 Long term (current) use of insulin: Secondary | ICD-10-CM | POA: Insufficient documentation

## 2023-04-29 DIAGNOSIS — Z9641 Presence of insulin pump (external) (internal): Secondary | ICD-10-CM | POA: Insufficient documentation

## 2023-04-29 DIAGNOSIS — S81801A Unspecified open wound, right lower leg, initial encounter: Secondary | ICD-10-CM | POA: Diagnosis not present

## 2023-04-29 LAB — CMP (CANCER CENTER ONLY)
ALT: 14 U/L (ref 0–44)
AST: 28 U/L (ref 15–41)
Albumin: 2.7 g/dL — ABNORMAL LOW (ref 3.5–5.0)
Alkaline Phosphatase: 112 U/L (ref 38–126)
Anion gap: 11 (ref 5–15)
BUN: 18 mg/dL (ref 8–23)
CO2: 26 mmol/L (ref 22–32)
Calcium: 8.1 mg/dL — ABNORMAL LOW (ref 8.9–10.3)
Chloride: 101 mmol/L (ref 98–111)
Creatinine: 1.3 mg/dL — ABNORMAL HIGH (ref 0.61–1.24)
GFR, Estimated: 58 mL/min — ABNORMAL LOW (ref >60)
Glucose, Bld: 234 mg/dL — ABNORMAL HIGH (ref 70–99)
Potassium: 4 mmol/L (ref 3.5–5.1)
Sodium: 138 mmol/L (ref 135–145)
Total Bilirubin: 0.7 mg/dL (ref 0.3–1.2)
Total Protein: 5.6 g/dL — ABNORMAL LOW (ref 6.5–8.1)

## 2023-04-29 LAB — CBC WITH DIFFERENTIAL (CANCER CENTER ONLY)
Abs Immature Granulocytes: 0.03 10*3/uL (ref 0.00–0.07)
Basophils Absolute: 0 10*3/uL (ref 0.0–0.1)
Basophils Relative: 1 %
Eosinophils Absolute: 0.1 10*3/uL (ref 0.0–0.5)
Eosinophils Relative: 3 %
HCT: 30.6 % — ABNORMAL LOW (ref 39.0–52.0)
Hemoglobin: 9.8 g/dL — ABNORMAL LOW (ref 13.0–17.0)
Immature Granulocytes: 1 %
Lymphocytes Relative: 29 %
Lymphs Abs: 0.9 10*3/uL (ref 0.7–4.0)
MCH: 29.8 pg (ref 26.0–34.0)
MCHC: 32 g/dL (ref 30.0–36.0)
MCV: 93 fL (ref 80.0–100.0)
Monocytes Absolute: 0.4 10*3/uL (ref 0.1–1.0)
Monocytes Relative: 13 %
Neutro Abs: 1.7 10*3/uL (ref 1.7–7.7)
Neutrophils Relative %: 53 %
Platelet Count: 104 10*3/uL — ABNORMAL LOW (ref 150–400)
RBC: 3.29 MIL/uL — ABNORMAL LOW (ref 4.22–5.81)
RDW: 16.5 % — ABNORMAL HIGH (ref 11.5–15.5)
WBC Count: 3.2 10*3/uL — ABNORMAL LOW (ref 4.0–10.5)
nRBC: 0 % (ref 0.0–0.2)

## 2023-04-29 LAB — MAGNESIUM: Magnesium: 1.7 mg/dL (ref 1.7–2.4)

## 2023-04-29 LAB — LACTATE DEHYDROGENASE: LDH: 151 U/L (ref 98–192)

## 2023-04-29 NOTE — Progress Notes (Signed)
[facility]  [phone] [phone]  ID: Gary [name] OB: 1946-04-21  MR#: [id number]  FIE#:[id number]  Patient Care Team: Dale Mulberry, MD as PCP - General (Internal Medicine) Jeralyn Ruths, MD as Consulting Physician (Oncology) Wyn Quaker Marlow Baars, MD as Referring Physician (Vascular Surgery) Schnier, Latina Craver, MD (Vascular Surgery) Lamar Blinks, MD as Consulting Physician (Cardiology) Alwyn Pea, MD as Consulting Physician (Cardiology) [name], MD as Consulting Physician (Gastroenterology)  CHIEF COMPLAINT: Recurrent Mantle cell lymphoma.  INTERVAL HISTORY: Patient returns to clinic today for further evaluation and discussion of his imaging results.  He has had multiple admissions recently for ketoacidosis, CVA, and neck fracture.  Patient continues to be in rehab and requires transportation via EMS today.  Despite this, patient states he is significantly improving and about "80%".  His weakness and fatigue have improved.  He denies any fevers, chills, or night sweats.  He has no neurologic complaints.  He denies any chest pain, shortness of breath, cough, or hemoptysis.  He has no nausea, vomiting, or constipation.  He denies any melena or hematochezia.  He has no urinary complaints.  Patient offers no further specific complaints today.  Review of Systems  Constitutional:  Positive for malaise/fatigue. Negative for diaphoresis, fever and weight loss.  Respiratory: Negative.  Negative for cough, hemoptysis and shortness of breath.   Cardiovascular: Negative.  Negative for chest pain and leg swelling.  Gastrointestinal: Negative.  Negative for abdominal pain and diarrhea.  Genitourinary: Negative.  Negative for dysuria.  Musculoskeletal: Negative.  Negative for back pain.  Skin: Negative.  Negative for rash.  Neurological:  Positive for weakness. Negative for dizziness, focal weakness and headaches.  Psychiatric/Behavioral:  Negative.  The patient is not nervous/anxious.     As per HPI. Otherwise, a complete review of systems is negative.  PAST MEDICAL HISTORY: 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    Gastroparesis    Graves disease    Hypercholesterolemia    Hypertension    Mantle cell lymphoma (HCC) 07/2014   Peripheral neuropathy    Shingles     PAST SURGICAL HISTORY: Past Surgical History:  Procedure Laterality Date   CHOLECYSTECTOMY     COLONOSCOPY WITH PROPOFOL N/A 02/04/2023   Procedure: COLONOSCOPY WITH PROPOFOL;  Surgeon: [name], MD;  Location: [facility] ENDOSCOPY;  Service: Gastroenterology;  Laterality: N/A;   ESOPHAGOGASTRODUODENOSCOPY (EGD) WITH PROPOFOL N/A 01/18/2023   Procedure: ESOPHAGOGASTRODUODENOSCOPY (EGD) WITH PROPOFOL;  Surgeon: Jaynie Collins, DO;  Location: [facility] ENDOSCOPY;  Service: Gastroenterology;  Laterality: N/A;   NEPHRECTOMY     right   PACEMAKER INSERTION N/A 12/07/2017   Procedure: INSERTION PACEMAKER DUAL CHAMBER INITIAL INSERT;  Surgeon: Marcina Millard, MD;  Location: [facility] ORS;  Service: Cardiovascular;  Laterality: N/A;   PORTA CATH INSERTION N/A 11/03/2017   Procedure: PORTA CATH INSERTION;  Surgeon: Renford Dills, MD;  Location: [facility] INVASIVE CV LAB;  Service: Cardiovascular;  Laterality: N/A;    FAMILY HISTORY Family History  Problem Relation Age of Onset   Breast cancer Mother    Diabetes Father    Cancer Father        Lymphoma   Alcohol abuse Maternal Uncle    Diabetes Sister    Heart disease Other        grandfather       ADVANCED  DIRECTIVES:    HEALTH MAINTENANCE: Social History   Tobacco Use   Smoking status: Former    Types: Cigarettes    Passive exposure: Past   Smokeless tobacco: Former    Types: Chew   Tobacco comments:    Last uses chew in 2/3 months  Vaping Use    Vaping status: Never Used  Substance Use Topics   Alcohol use: Yes    Alcohol/week: 3.0 standard drinks of alcohol    Types: 3 Cans of beer per week    Comment: nightly   Drug use: Never     Colonoscopy:  PAP:  Bone density:  Lipid panel:  Allergies  Allergen Reactions   Rofecoxib Nausea Only    Current Outpatient Medications  Medication Sig Dispense Refill   acetaminophen (TYLENOL) 650 MG CR tablet Take 650 mg by mouth every 8 (eight) hours as needed for pain.     Cholecalciferol (VITAMIN D3) 50 MCG (2000 UT) CAPS Take 2,000 Units by mouth daily.     ferrous sulfate 325 (65 FE) MG tablet Take 325 mg by mouth daily with breakfast.     FLUoxetine (PROZAC) 20 MG tablet Take 1 tablet (20 mg total) by mouth daily. 30 tablet 2   folic acid (FOLVITE) 1 MG tablet Take 1 tablet (1 mg total) by mouth daily.     furosemide (LASIX) 40 MG tablet Take 2 tablets in the morning, and 1 tablet in the evening. 90 tablet 6   glucose 4 GM chewable tablet Chew 1 tablet (4 g total) by mouth once as needed for low blood sugar. 50 tablet 0   insulin glargine-yfgn (SEMGLEE, YFGN,) 100 UNIT/ML Pen Inject 4 Units into the skin at bedtime.     pantoprazole (PROTONIX) 40 MG tablet Take 1 tablet (40 mg total) by mouth 2 (two) times daily before a meal.     vitamin B-12 (CYANOCOBALAMIN) 1000 MCG tablet Take 1,000 mcg by mouth daily.     cholestyramine (QUESTRAN) 4 g packet Take 1 packet (4 g total) by mouth 2 (two) times daily. (Patient not taking: Reported on 04/29/2023) 60 each 12   clotrimazole-betamethasone (LOTRISONE) cream Apply topically 2 (two) times daily. (Patient not taking: Reported on 04/29/2023) 30 g 0   feeding supplement, GLUCERNA SHAKE, (GLUCERNA SHAKE) LIQD Take 237 mLs by mouth 2 (two) times daily between meals. (Patient not taking: Reported on 04/29/2023) 237 mL 0   insulin aspart (NOVOLOG) 100 UNIT/ML injection Inject 6 Units into the skin 3 (three) times daily with meals. (Patient not taking:  Reported on 04/29/2023)     lactobacillus (FLORANEX/LACTINEX) PACK Take 1 packet (1 g total) by mouth 3 (three) times daily with meals. (Patient not taking: Reported on 04/29/2023)     levothyroxine (SYNTHROID) 50 MCG tablet Take 1 tablet (50 mcg total) by mouth daily before breakfast. (Patient not taking: Reported on 04/29/2023)     loperamide (IMODIUM) 2 MG capsule Take 1 capsule (2 mg total) by mouth as needed for diarrhea or loose stools. (Patient not taking: Reported on 04/29/2023) 30 capsule 0   midodrine (PROAMATINE) 10 MG tablet Take 1 tablet (10 mg total) by mouth 3 (three) times daily with meals. (Patient not taking: Reported on 04/29/2023)     Na Sulfate-K Sulfate-Mg Sulf 17.5-3.13-1.6 GM/177ML SOLN Take by mouth. Take as directed. (Patient not taking: Reported on 04/29/2023)     nystatin cream (MYCOSTATIN) APPLY TO AFFECTED AREA TWICE A DAY (Patient not taking: Reported on 04/29/2023) 30 g 1  potassium chloride SA (KLOR-CON M) 20 MEQ tablet Take 1 tablet (20 mEq total) by mouth 2 (two) times daily. (Patient not taking: Reported on 04/29/2023) 7 tablet 0   pravastatin (PRAVACHOL) 40 MG tablet TAKE 1 TABLET BY MOUTH EVERY DAY (Patient not taking: Reported on 04/29/2023) 90 tablet 1   promethazine (PHENERGAN) 12.5 MG tablet Take 12.5 mg by mouth every 6 (six) hours as needed for nausea or vomiting. (Patient not taking: Reported on 04/29/2023)     No current facility-administered medications for this visit.   Facility-Administered Medications Ordered in Other Visits  Medication Dose Route Frequency Provider Last Rate Last Admin   heparin lock flush 100 unit/mL  500 Units Intravenous Once Jeralyn Ruths, MD       sodium chloride flush (NS) 0.9 % injection 10 mL  10 mL Intravenous PRN Jeralyn Ruths, MD   10 mL at 02/01/18 0829   sodium chloride flush (NS) 0.9 % injection 10 mL  10 mL Intravenous PRN Jeralyn Ruths, MD   10 mL at 04/05/18 0800    OBJECTIVE: Vitals:   04/29/23 1107   BP: 107/62  Pulse: 76  Resp: 18  SpO2: 100%     There is no height or weight on file to calculate BMI.    ECOG FS:3 - Symptomatic, >50% confined to bed  General: Well-developed, well-nourished, no acute distress. Eyes: Pink conjunctiva, anicteric sclera. HEENT: Normocephalic, moist mucous membranes. Lungs: No audible wheezing or coughing. Heart: Regular rate and rhythm. Abdomen: Soft, nontender, no obvious distention. Musculoskeletal: No edema, cyanosis, or clubbing. Neuro: Alert, answering all questions appropriately. Cranial nerves grossly intact. Skin: No rashes or petechiae noted. Psych: Normal affect.  LAB RESULTS:  Lab Results  Component Value Date   NA 138 04/29/2023   K 4.0 04/29/2023   CL 101 04/29/2023   CO2 26 04/29/2023   GLUCOSE 234 (H) 04/29/2023   BUN 18 04/29/2023   CREATININE 1.30 (H) 04/29/2023   CALCIUM 8.1 (L) 04/29/2023   PROT 5.6 (L) 04/29/2023   ALBUMIN 2.7 (L) 04/29/2023   AST 28 04/29/2023   ALT 14 04/29/2023   ALKPHOS 112 04/29/2023   BILITOT 0.7 04/29/2023   GFRNONAA 58 (L) 04/29/2023   GFRAA 41 (L) 01/24/2020    Lab Results  Component Value Date   WBC 3.2 (L) 04/29/2023   NEUTROABS 1.7 04/29/2023   HGB 9.8 (L) 04/29/2023   HCT 30.6 (L) 04/29/2023   MCV 93.0 04/29/2023   PLT 104 (L) 04/29/2023   Lab Results  Component Value Date   IRON 53 02/07/2023   TIBC 133 (L) 02/07/2023   IRONPCTSAT 40 (H) 02/07/2023   Lab Results  Component Value Date   FERRITIN 42.0 08/11/2017     STUDIES:  ONCOLOGY HISTORY:  Patient's initial diagnosis was on inguinal lymph node biopsy on July 12, 2014.  Patient was noted to have progressive disease and underwent chemotherapy using Rituxan and Treanda completing cycle 4 on Mar 09, 2018.  CT scan on August 07, 2022 reviewed independently with clearly progressive disease with increasing lymphadenopathy.  PET scan results from August 24, 2022 confirmed recurrent disease.  Patient then underwent 3  additional cycles of Rituxan plus Treanda completing on November 06, 2022.  Treatment was discontinued secondary to declining performance status.  PET scan results from December 10, 2022 revealed an least a very good partial remission.   ASSESSMENT: Recurrent low-grade mantle cell lymphoma. PLAN:    Mantle cell lymphoma, stage III: See oncology  history as above.  Patient last received chemotherapy with Rituxan and Treanda on November 06, 2022.  PET scan results from December 10, 2022 reviewed independently with significant interval response to therapy and no progressive disease identified.  CT scan results from March 31, 2023 reviewed independently with no obvious evidence of recurrent or progressive disease.  No intervention is needed at this time.  Return to clinic in 6 months with repeat imaging and further evaluation.   Recent CVA/neck fracture: Continue at rehab as needed.   Decreased ejection fraction: Improved.  Patient's most recent cardiac echo in November 2020 revealed an ejection fraction estimated to be 50 to 55%. Continue monitoring and evaluation per cardiology. Hyperglycemia: Patient has significantly improved blood glucose control.  Continue insulin pump as directed. Thrombocytopenia: Chronic and unchanged.  Patient's platelet count is 104 today. Anemia: Chronic and unchanged.  Patient's hemoglobin is 9.8 today. Leukopenia: Chronic and unchanged.  Patient's white count is 3.2 today. Bradycardia: Patient has pacemaker in place. Renal insufficiency: Mild, monitor.  Patient's creatinine is 1.3 today. Diarrhea: Resolved.  Patient expressed understanding and was in agreement with this plan. He also understands that He can call clinic at any time with any questions, concerns, or complaints.    Jeralyn Ruths, MD   04/29/2023 11:38 AM

## 2023-04-29 NOTE — Progress Notes (Signed)
Patient here today for follow up regarding lymphoma, CT results. Patient transported by EMS from facility for visit today. Patient denies pain. Patient reports he is working with PT at facility to learn to walk again after recent stroke.

## 2023-04-30 DIAGNOSIS — C8310 Mantle cell lymphoma, unspecified site: Secondary | ICD-10-CM | POA: Diagnosis not present

## 2023-04-30 DIAGNOSIS — E876 Hypokalemia: Secondary | ICD-10-CM | POA: Diagnosis not present

## 2023-04-30 DIAGNOSIS — I639 Cerebral infarction, unspecified: Secondary | ICD-10-CM | POA: Diagnosis not present

## 2023-04-30 DIAGNOSIS — I5022 Chronic systolic (congestive) heart failure: Secondary | ICD-10-CM | POA: Diagnosis not present

## 2023-04-30 DIAGNOSIS — E039 Hypothyroidism, unspecified: Secondary | ICD-10-CM | POA: Diagnosis not present

## 2023-05-06 DIAGNOSIS — S81802A Unspecified open wound, left lower leg, initial encounter: Secondary | ICD-10-CM | POA: Diagnosis not present

## 2023-05-07 ENCOUNTER — Ambulatory Visit
Admission: RE | Admit: 2023-05-07 | Discharge: 2023-05-07 | Disposition: A | Discharge status: Home / Self Care | Payer: No Typology Code available for payment source | Attending: Orthopedic Surgery | Admitting: Orthopedic Surgery

## 2023-05-07 ENCOUNTER — Ambulatory Visit
Admission: RE | Admit: 2023-05-07 | Discharge: 2023-05-07 | Disposition: A | Discharge status: Home / Self Care | Payer: No Typology Code available for payment source | Source: Ambulatory Visit | Attending: Orthopedic Surgery | Admitting: Orthopedic Surgery

## 2023-05-07 DIAGNOSIS — S12401D Unspecified nondisplaced fracture of fifth cervical vertebra, subsequent encounter for fracture with routine healing: Secondary | ICD-10-CM

## 2023-05-07 DIAGNOSIS — Z950 Presence of cardiac pacemaker: Secondary | ICD-10-CM | POA: Diagnosis not present

## 2023-05-07 DIAGNOSIS — M47812 Spondylosis without myelopathy or radiculopathy, cervical region: Secondary | ICD-10-CM | POA: Diagnosis not present

## 2023-05-07 DIAGNOSIS — M8580 Other specified disorders of bone density and structure, unspecified site: Secondary | ICD-10-CM | POA: Diagnosis not present

## 2023-05-07 DIAGNOSIS — M40209 Unspecified kyphosis, site unspecified: Secondary | ICD-10-CM | POA: Diagnosis not present

## 2023-05-12 DIAGNOSIS — I951 Orthostatic hypotension: Secondary | ICD-10-CM | POA: Diagnosis not present

## 2023-05-12 DIAGNOSIS — I5022 Chronic systolic (congestive) heart failure: Secondary | ICD-10-CM | POA: Diagnosis not present

## 2023-05-12 DIAGNOSIS — R600 Localized edema: Secondary | ICD-10-CM | POA: Diagnosis not present

## 2023-05-12 DIAGNOSIS — E109 Type 1 diabetes mellitus without complications: Secondary | ICD-10-CM | POA: Diagnosis not present

## 2023-05-13 DIAGNOSIS — S81802A Unspecified open wound, left lower leg, initial encounter: Secondary | ICD-10-CM | POA: Diagnosis not present

## 2023-05-16 DIAGNOSIS — E1010 Type 1 diabetes mellitus with ketoacidosis without coma: Secondary | ICD-10-CM | POA: Insufficient documentation

## 2023-05-16 DIAGNOSIS — R739 Hyperglycemia, unspecified: Secondary | ICD-10-CM | POA: Diagnosis not present

## 2023-05-16 DIAGNOSIS — I10 Essential (primary) hypertension: Secondary | ICD-10-CM | POA: Diagnosis not present

## 2023-05-16 DIAGNOSIS — Z79899 Other long term (current) drug therapy: Secondary | ICD-10-CM | POA: Diagnosis not present

## 2023-05-16 DIAGNOSIS — J811 Chronic pulmonary edema: Secondary | ICD-10-CM | POA: Diagnosis not present

## 2023-05-16 DIAGNOSIS — G909 Disorder of the autonomic nervous system, unspecified: Secondary | ICD-10-CM | POA: Diagnosis not present

## 2023-05-16 DIAGNOSIS — D61818 Other pancytopenia: Secondary | ICD-10-CM | POA: Diagnosis not present

## 2023-05-16 DIAGNOSIS — R0602 Shortness of breath: Secondary | ICD-10-CM | POA: Diagnosis not present

## 2023-05-16 DIAGNOSIS — Z7982 Long term (current) use of aspirin: Secondary | ICD-10-CM | POA: Diagnosis not present

## 2023-05-16 DIAGNOSIS — R571 Hypovolemic shock: Secondary | ICD-10-CM | POA: Diagnosis not present

## 2023-05-16 DIAGNOSIS — L97929 Non-pressure chronic ulcer of unspecified part of left lower leg with unspecified severity: Secondary | ICD-10-CM | POA: Diagnosis not present

## 2023-05-16 DIAGNOSIS — Z794 Long term (current) use of insulin: Secondary | ICD-10-CM | POA: Diagnosis not present

## 2023-05-16 DIAGNOSIS — M6281 Muscle weakness (generalized): Secondary | ICD-10-CM | POA: Diagnosis not present

## 2023-05-16 DIAGNOSIS — R1111 Vomiting without nausea: Secondary | ICD-10-CM | POA: Diagnosis not present

## 2023-05-16 DIAGNOSIS — I110 Hypertensive heart disease with heart failure: Secondary | ICD-10-CM | POA: Diagnosis not present

## 2023-05-16 DIAGNOSIS — C8310 Mantle cell lymphoma, unspecified site: Secondary | ICD-10-CM | POA: Diagnosis not present

## 2023-05-16 DIAGNOSIS — R279 Unspecified lack of coordination: Secondary | ICD-10-CM | POA: Diagnosis not present

## 2023-05-16 DIAGNOSIS — E039 Hypothyroidism, unspecified: Secondary | ICD-10-CM | POA: Diagnosis not present

## 2023-05-16 DIAGNOSIS — J189 Pneumonia, unspecified organism: Secondary | ICD-10-CM | POA: Diagnosis not present

## 2023-05-16 DIAGNOSIS — I5042 Chronic combined systolic (congestive) and diastolic (congestive) heart failure: Secondary | ICD-10-CM | POA: Diagnosis not present

## 2023-05-16 DIAGNOSIS — Z9641 Presence of insulin pump (external) (internal): Secondary | ICD-10-CM | POA: Diagnosis not present

## 2023-05-16 DIAGNOSIS — R5381 Other malaise: Secondary | ICD-10-CM | POA: Diagnosis not present

## 2023-05-16 DIAGNOSIS — N179 Acute kidney failure, unspecified: Secondary | ICD-10-CM | POA: Diagnosis not present

## 2023-05-16 DIAGNOSIS — R578 Other shock: Secondary | ICD-10-CM | POA: Diagnosis not present

## 2023-05-16 DIAGNOSIS — Z905 Acquired absence of kidney: Secondary | ICD-10-CM | POA: Diagnosis not present

## 2023-05-16 DIAGNOSIS — Z1152 Encounter for screening for COVID-19: Secondary | ICD-10-CM | POA: Diagnosis not present

## 2023-05-16 DIAGNOSIS — L97919 Non-pressure chronic ulcer of unspecified part of right lower leg with unspecified severity: Secondary | ICD-10-CM | POA: Diagnosis not present

## 2023-05-16 DIAGNOSIS — I951 Orthostatic hypotension: Secondary | ICD-10-CM | POA: Diagnosis not present

## 2023-05-16 DIAGNOSIS — R918 Other nonspecific abnormal finding of lung field: Secondary | ICD-10-CM | POA: Diagnosis not present

## 2023-05-16 DIAGNOSIS — E1110 Type 2 diabetes mellitus with ketoacidosis without coma: Secondary | ICD-10-CM | POA: Diagnosis not present

## 2023-05-16 DIAGNOSIS — R112 Nausea with vomiting, unspecified: Secondary | ICD-10-CM | POA: Diagnosis not present

## 2023-05-16 DIAGNOSIS — I959 Hypotension, unspecified: Secondary | ICD-10-CM | POA: Diagnosis not present

## 2023-05-16 DIAGNOSIS — I872 Venous insufficiency (chronic) (peripheral): Secondary | ICD-10-CM | POA: Diagnosis not present

## 2023-05-19 DIAGNOSIS — E109 Type 1 diabetes mellitus without complications: Secondary | ICD-10-CM | POA: Diagnosis not present

## 2023-05-19 DIAGNOSIS — E039 Hypothyroidism, unspecified: Secondary | ICD-10-CM | POA: Diagnosis not present

## 2023-05-19 DIAGNOSIS — I951 Orthostatic hypotension: Secondary | ICD-10-CM | POA: Diagnosis not present

## 2023-05-19 DIAGNOSIS — F32A Depression, unspecified: Secondary | ICD-10-CM | POA: Diagnosis not present

## 2023-05-19 DIAGNOSIS — C8310 Mantle cell lymphoma, unspecified site: Secondary | ICD-10-CM | POA: Diagnosis not present

## 2023-05-19 DIAGNOSIS — E1010 Type 1 diabetes mellitus with ketoacidosis without coma: Secondary | ICD-10-CM | POA: Diagnosis not present

## 2023-05-21 DIAGNOSIS — E785 Hyperlipidemia, unspecified: Secondary | ICD-10-CM | POA: Diagnosis not present

## 2023-05-21 DIAGNOSIS — I639 Cerebral infarction, unspecified: Secondary | ICD-10-CM | POA: Diagnosis not present

## 2023-05-21 DIAGNOSIS — E039 Hypothyroidism, unspecified: Secondary | ICD-10-CM | POA: Diagnosis not present

## 2023-05-21 DIAGNOSIS — E109 Type 1 diabetes mellitus without complications: Secondary | ICD-10-CM | POA: Diagnosis not present

## 2023-05-21 DIAGNOSIS — I5022 Chronic systolic (congestive) heart failure: Secondary | ICD-10-CM | POA: Diagnosis not present

## 2023-05-24 ENCOUNTER — Encounter: Payer: Self-pay | Admitting: Orthopedic Surgery

## 2023-05-24 DIAGNOSIS — E039 Hypothyroidism, unspecified: Secondary | ICD-10-CM | POA: Diagnosis not present

## 2023-05-24 DIAGNOSIS — E109 Type 1 diabetes mellitus without complications: Secondary | ICD-10-CM | POA: Diagnosis not present

## 2023-05-24 DIAGNOSIS — I5022 Chronic systolic (congestive) heart failure: Secondary | ICD-10-CM | POA: Diagnosis not present

## 2023-05-24 DIAGNOSIS — F32A Depression, unspecified: Secondary | ICD-10-CM | POA: Diagnosis not present

## 2023-05-24 DIAGNOSIS — I951 Orthostatic hypotension: Secondary | ICD-10-CM | POA: Diagnosis not present

## 2023-05-24 NOTE — Progress Notes (Signed)
Cervical xrays dated 05/07/23:  FINDINGS: Kyphosis. Osteopenia. Preserved vertebral body height. Mild disc height loss at C6-7. Mild scattered endplate osteophytes identified from C4 through C7. Scattered facet degenerative changes. Hypertrophic changes of the dens C1 interval. Previous distraction injury at C6-7 by CT is not well seen on this x-ray. There is slight widening of the disc space at C5-6 and along the facet joint however. Similar to previous. Trace listhesis seen at multiple levels particularly at C3-4, C4-5. Preserved prevertebral soft tissues. At the edge of the imaging field note is made of a right upper chest port. Left-sided battery pack for pacemaker. Please correlate with prior imaging.   IMPRESSION: Osteopenia with degenerative changes. Previous injury at C6-7 is not as well appreciated. However there is stable widening of the disc space at C5-6. Additional widening along the facet joint at this level.     Electronically Signed   By: Karen Kays M.D.   On: 05/15/2023 19:02  I have personally reviewed the images and agree with the above interpretation.   Above xrays reviewed with Dr. Myer Haff. No instability on flexion/extension noted.   He does not need any neck braces and can follow up with [place] prn.   I called and spoke to his sister [name] and let her know. I also LM for Mr. Spong to call me back.   Will fax this note to his SNF as well.

## 2023-05-25 DIAGNOSIS — I639 Cerebral infarction, unspecified: Secondary | ICD-10-CM | POA: Diagnosis not present

## 2023-05-25 DIAGNOSIS — S12601D Unspecified nondisplaced fracture of seventh cervical vertebra, subsequent encounter for fracture with routine healing: Secondary | ICD-10-CM | POA: Diagnosis not present

## 2023-05-25 DIAGNOSIS — I951 Orthostatic hypotension: Secondary | ICD-10-CM | POA: Diagnosis not present

## 2023-05-25 DIAGNOSIS — E109 Type 1 diabetes mellitus without complications: Secondary | ICD-10-CM | POA: Diagnosis not present

## 2023-05-26 ENCOUNTER — Other Ambulatory Visit: Payer: Self-pay

## 2023-05-26 DIAGNOSIS — C8310 Mantle cell lymphoma, unspecified site: Secondary | ICD-10-CM | POA: Diagnosis not present

## 2023-05-26 DIAGNOSIS — D61818 Other pancytopenia: Secondary | ICD-10-CM | POA: Diagnosis not present

## 2023-05-26 DIAGNOSIS — E119 Type 2 diabetes mellitus without complications: Secondary | ICD-10-CM | POA: Diagnosis not present

## 2023-05-26 DIAGNOSIS — I639 Cerebral infarction, unspecified: Secondary | ICD-10-CM | POA: Diagnosis not present

## 2023-05-26 DIAGNOSIS — R197 Diarrhea, unspecified: Secondary | ICD-10-CM | POA: Diagnosis not present

## 2023-05-26 NOTE — Telephone Encounter (Signed)
I spoke to her the other day. Not sure why she is calling back. I called her and left a message to call me back if needed.

## 2023-05-26 NOTE — Telephone Encounter (Signed)
-----   Message from Patrisia C sent at 05/26/2023  7:52 AM EDT ----- Regarding: returning call 05/25/23 9:52am Message from Stalin Houlton stating she is returning your call about her brother [phone]

## 2023-05-27 ENCOUNTER — Ambulatory Visit: Payer: Medicare Other | Admitting: Gastroenterology

## 2023-05-27 ENCOUNTER — Encounter: Payer: Self-pay | Admitting: Gastroenterology

## 2023-05-27 VITALS — BP 108/65 | HR 71 | Temp 98.1°F

## 2023-05-27 DIAGNOSIS — K5900 Constipation, unspecified: Secondary | ICD-10-CM | POA: Diagnosis not present

## 2023-05-27 DIAGNOSIS — I639 Cerebral infarction, unspecified: Secondary | ICD-10-CM | POA: Diagnosis not present

## 2023-05-27 DIAGNOSIS — C8310 Mantle cell lymphoma, unspecified site: Secondary | ICD-10-CM | POA: Diagnosis not present

## 2023-05-27 DIAGNOSIS — E039 Hypothyroidism, unspecified: Secondary | ICD-10-CM | POA: Diagnosis not present

## 2023-05-27 DIAGNOSIS — E785 Hyperlipidemia, unspecified: Secondary | ICD-10-CM | POA: Diagnosis not present

## 2023-05-27 DIAGNOSIS — D61818 Other pancytopenia: Secondary | ICD-10-CM | POA: Diagnosis not present

## 2023-05-27 DIAGNOSIS — E559 Vitamin D deficiency, unspecified: Secondary | ICD-10-CM | POA: Diagnosis not present

## 2023-05-27 DIAGNOSIS — Z87898 Personal history of other specified conditions: Secondary | ICD-10-CM | POA: Diagnosis not present

## 2023-05-27 DIAGNOSIS — I951 Orthostatic hypotension: Secondary | ICD-10-CM | POA: Diagnosis not present

## 2023-05-27 DIAGNOSIS — K219 Gastro-esophageal reflux disease without esophagitis: Secondary | ICD-10-CM | POA: Diagnosis not present

## 2023-05-27 NOTE — Progress Notes (Signed)
[name] MD, MRCP(U.K) [address]  [address]  [place], [place] [address]  Main: [phone]  Fax: [phone]   Primary Care Physician: Dale Dover, MD  Primary Gastroenterologist:  Dr. [name]   Chief Complaint  Patient presents with   Diarrhea    HPI: Gary [name] is a 77 y.o. male   Summary of history :  Initially referred and seen back in 01/28/2023. being treated for stage III recurrent mantle cell lymphoma.  Been having diarrhea since January 5 to 6 stools per day being referred to GI for diarrhea.   Was admitted to the hospital on 01/14/2023 was seen by Dr. [name] for dysphagia, diarrhea and vomiting.  Mild narrowing of the GI GE junction dilated to 48 [place].   01/25/2023 hemoglobin 9.1 g platelet count of 44 01/15/2023 stool GI PCR not detected 01/14/2023 INR 3.5 01/18/2023 INR 1.4   He states continues to have multiple watery bowel movements a day.  Consumes some artificial sugars in his drinks such as sweet and low and also takes multiple aspirins a day for pain.  No other artificial sugars or weakness.  He has on his medical record an order for Glucerna but he does not recollect taking it  Interval history   01/28/2023-05/27/2023  01/28/2023: celiac serology -negative 02/04/2023: colonoscopy : Tubular adenomas x2 , random colon bx : non specific active mucosal colitis.   04/29/2023 seen by Dr. Orlie Dakin, recent admissions for DKA CVA on Rituxan diarrhea has resolved.  05/16/2023 was at [facility] with diabetic ketoacidosis hypovolemic shock He says that absolutely stop drinking the Glucerna the diarrhea resolved.  At this point of time he is actually constipated his medication list includes senna and MiraLAX.  No other GI issues at this point of time.  Current Outpatient Medications  Medication Sig Dispense Refill   acetaminophen (TYLENOL) 650 MG CR tablet Take 650 mg by mouth every 8 (eight) hours as needed for pain.     aspirin EC 81 MG tablet Take 81 mg by mouth  daily.     atorvastatin (LIPITOR) 40 MG tablet Take 40 mg by mouth daily.     Cholecalciferol (VITAMIN D3) 50 MCG (2000 UT) CAPS Take 2,000 Units by mouth daily.     cholestyramine (QUESTRAN) 4 g packet Take 1 packet (4 g total) by mouth 2 (two) times daily. (Patient not taking: Reported on 04/29/2023) 60 each 12   clotrimazole-betamethasone (LOTRISONE) cream Apply topically 2 (two) times daily. (Patient not taking: Reported on 04/29/2023) 30 g 0   feeding supplement, GLUCERNA SHAKE, (GLUCERNA SHAKE) LIQD Take 237 mLs by mouth 2 (two) times daily between meals. (Patient not taking: Reported on 04/29/2023) 237 mL 0   ferrous sulfate 325 (65 FE) MG tablet Take 325 mg by mouth daily with breakfast.     FLUoxetine (PROZAC) 20 MG tablet Take 1 tablet (20 mg total) by mouth daily. 30 tablet 2   Fluoxetine HCl, PMDD, 20 MG TABS Take 1 tablet by mouth daily.     folic acid (FOLVITE) 1 MG tablet Take 1 tablet (1 mg total) by mouth daily.     furosemide (LASIX) 40 MG tablet Take 2 tablets in the morning, and 1 tablet in the evening. 90 tablet 6   glucose 4 GM chewable tablet Chew 1 tablet (4 g total) by mouth once as needed for low blood sugar. 50 tablet 0   insulin aspart (NOVOLOG) 100 UNIT/ML injection Inject 6 Units into the skin 3 (  three) times daily with meals. (Patient not taking: Reported on 04/29/2023)     insulin glargine (LANTUS) 100 UNIT/ML injection Inject 17 Units into the skin at bedtime.     insulin glargine-yfgn (SEMGLEE, YFGN,) 100 UNIT/ML Pen Inject 4 Units into the skin at bedtime.     lactobacillus (FLORANEX/LACTINEX) PACK Take 1 packet (1 g total) by mouth 3 (three) times daily with meals. (Patient not taking: Reported on 04/29/2023)     levothyroxine (SYNTHROID) 50 MCG tablet Take 1 tablet (50 mcg total) by mouth daily before breakfast. (Patient not taking: Reported on 04/29/2023)     loperamide (IMODIUM) 2 MG capsule Take 1 capsule (2 mg total) by mouth as needed for diarrhea or loose stools.  (Patient not taking: Reported on 04/29/2023) 30 capsule 0   midodrine (PROAMATINE) 10 MG tablet Take 1 tablet (10 mg total) by mouth 3 (three) times daily with meals. (Patient not taking: Reported on 04/29/2023)     Multiple Vitamin (MULTIVITAMIN) tablet Take 1 tablet by mouth daily.     Na Sulfate-K Sulfate-Mg Sulf 17.5-3.13-1.6 GM/177ML SOLN Take by mouth. Take as directed. (Patient not taking: Reported on 04/29/2023)     nystatin cream (MYCOSTATIN) APPLY TO AFFECTED AREA TWICE A DAY (Patient not taking: Reported on 04/29/2023) 30 g 1   pantoprazole (PROTONIX) 40 MG tablet Take 1 tablet (40 mg total) by mouth 2 (two) times daily before a meal.     Potassium Chloride ER 20 MEQ TBCR Take 1 tablet by mouth.     potassium chloride SA (KLOR-CON M) 20 MEQ tablet Take 1 tablet (20 mEq total) by mouth 2 (two) times daily. (Patient not taking: Reported on 04/29/2023) 7 tablet 0   pravastatin (PRAVACHOL) 40 MG tablet TAKE 1 TABLET BY MOUTH EVERY DAY (Patient not taking: Reported on 04/29/2023) 90 tablet 1   promethazine (PHENERGAN) 12.5 MG tablet Take 12.5 mg by mouth every 6 (six) hours as needed for nausea or vomiting. (Patient not taking: Reported on 04/29/2023)     senna (SENOKOT) 8.6 MG tablet Take 1 tablet by mouth daily.     thiamine (VITAMIN B1) 100 MG tablet Take 1 tablet by mouth daily.     vitamin B-12 (CYANOCOBALAMIN) 1000 MCG tablet Take 1,000 mcg by mouth daily.     No current facility-administered medications for this visit.   Facility-Administered Medications Ordered in Other Visits  Medication Dose Route Frequency Provider Last Rate Last Admin   heparin lock flush 100 unit/mL  500 Units Intravenous Once Jeralyn Ruths, MD       sodium chloride flush (NS) 0.9 % injection 10 mL  10 mL Intravenous PRN Jeralyn Ruths, MD   10 mL at 02/01/18 0829   sodium chloride flush (NS) 0.9 % injection 10 mL  10 mL Intravenous PRN Jeralyn Ruths, MD   10 mL at 04/05/18 0800    Allergies as of  05/27/2023 - Review Complete 04/29/2023  Allergen Reaction Noted   Rofecoxib Nausea Only 04/09/2015     ROS:  General: Negative for anorexia, weight loss, fever, chills, fatigue, weakness. ENT: Negative for hoarseness, difficulty swallowing , nasal congestion. CV: Negative for chest pain, angina, palpitations, dyspnea on exertion, peripheral edema.  Respiratory: Negative for dyspnea at rest, dyspnea on exertion, cough, sputum, wheezing.  GI: See history of present illness. GU:  Negative for dysuria, hematuria, urinary incontinence, urinary frequency, nocturnal urination.  Endo: Negative for unusual weight change.    Physical Examination:   There were  no vitals taken for this visit.  General: Appears very thin and cachectic brought in the stretcher Eyes: No icterus. Conjunctivae pink. Mouth: Oropharyngeal mucosa moist and pink , no lesions erythema or exudate. Neuro: Alert and oriented x 3.  Grossly intact. Psych: Alert and cooperative, normal mood and affect.   Imaging Studies: DG Cervical Spine Complete  Result Date: 05/15/2023 CLINICAL DATA:  Previous fracture EXAM: CERVICAL SPINE - COMPLETE 4 VIEW COMPARISON:  X-ray outside institution 03/05/2023, images only. CT 02/05/2023 FINDINGS: Kyphosis. Osteopenia. Preserved vertebral body height. Mild disc height loss at C6-7. Mild scattered endplate osteophytes identified from C4 through C7. Scattered facet degenerative changes. Hypertrophic changes of the dens C1 interval. Previous distraction injury at C6-7 by CT is not well seen on this x-ray. There is slight widening of the disc space at C5-6 and along the facet joint however. Similar to previous. Trace listhesis seen at multiple levels particularly at C3-4, C4-5. Preserved prevertebral soft tissues. At the edge of the imaging field note is made of a right upper chest port. Left-sided battery pack for pacemaker. Please correlate with prior imaging. IMPRESSION: Osteopenia with degenerative  changes. Previous injury at C6-7 is not as well appreciated. However there is stable widening of the disc space at C5-6. Additional widening along the facet joint at this level. Electronically Signed   By: Karen Kays M.D.   On: 05/15/2023 19:02    Assessment and Plan:   Gary [name] is a 77 y.o. y/o male  with stage III recurrent mantle cell lymphoma .  Here for follow-up for diarrhea.  Colonoscopy revealed nonspecific colitis.  Very likely his diarrhea secondary to artificial sugars present and his protein shakes.  Once he stopped it diarrhea has resolved to the extent that he is presently suffering from constipation and his active medication list includes senna and MiraLAX.      Dr [name]  MD,MRCP [facility]) Follow up as needed

## 2023-05-31 ENCOUNTER — Ambulatory Visit: Payer: 59 | Admitting: Gastroenterology

## 2023-05-31 DIAGNOSIS — R2681 Unsteadiness on feet: Secondary | ICD-10-CM | POA: Diagnosis not present

## 2023-05-31 DIAGNOSIS — M6281 Muscle weakness (generalized): Secondary | ICD-10-CM | POA: Diagnosis not present

## 2023-05-31 DIAGNOSIS — I63441 Cerebral infarction due to embolism of right cerebellar artery: Secondary | ICD-10-CM | POA: Diagnosis not present

## 2023-06-01 DIAGNOSIS — I63441 Cerebral infarction due to embolism of right cerebellar artery: Secondary | ICD-10-CM | POA: Diagnosis not present

## 2023-06-01 DIAGNOSIS — I152 Hypertension secondary to endocrine disorders: Secondary | ICD-10-CM | POA: Diagnosis not present

## 2023-06-01 DIAGNOSIS — M6281 Muscle weakness (generalized): Secondary | ICD-10-CM | POA: Diagnosis not present

## 2023-06-01 DIAGNOSIS — R2681 Unsteadiness on feet: Secondary | ICD-10-CM | POA: Diagnosis not present

## 2023-06-01 DIAGNOSIS — E10649 Type 1 diabetes mellitus with hypoglycemia without coma: Secondary | ICD-10-CM | POA: Diagnosis not present

## 2023-06-01 DIAGNOSIS — E785 Hyperlipidemia, unspecified: Secondary | ICD-10-CM | POA: Diagnosis not present

## 2023-06-01 DIAGNOSIS — E1159 Type 2 diabetes mellitus with other circulatory complications: Secondary | ICD-10-CM | POA: Diagnosis not present

## 2023-06-01 DIAGNOSIS — E1069 Type 1 diabetes mellitus with other specified complication: Secondary | ICD-10-CM | POA: Diagnosis not present

## 2023-06-02 DIAGNOSIS — R2681 Unsteadiness on feet: Secondary | ICD-10-CM | POA: Diagnosis not present

## 2023-06-02 DIAGNOSIS — I639 Cerebral infarction, unspecified: Secondary | ICD-10-CM | POA: Diagnosis not present

## 2023-06-02 DIAGNOSIS — M6281 Muscle weakness (generalized): Secondary | ICD-10-CM | POA: Diagnosis not present

## 2023-06-02 DIAGNOSIS — E119 Type 2 diabetes mellitus without complications: Secondary | ICD-10-CM | POA: Diagnosis not present

## 2023-06-02 DIAGNOSIS — F32A Depression, unspecified: Secondary | ICD-10-CM | POA: Diagnosis not present

## 2023-06-02 DIAGNOSIS — I63441 Cerebral infarction due to embolism of right cerebellar artery: Secondary | ICD-10-CM | POA: Diagnosis not present

## 2023-06-02 DIAGNOSIS — I5022 Chronic systolic (congestive) heart failure: Secondary | ICD-10-CM | POA: Diagnosis not present

## 2023-06-03 DIAGNOSIS — M6281 Muscle weakness (generalized): Secondary | ICD-10-CM | POA: Diagnosis not present

## 2023-06-03 DIAGNOSIS — I63441 Cerebral infarction due to embolism of right cerebellar artery: Secondary | ICD-10-CM | POA: Diagnosis not present

## 2023-06-03 DIAGNOSIS — R2681 Unsteadiness on feet: Secondary | ICD-10-CM | POA: Diagnosis not present

## 2023-06-04 DIAGNOSIS — R2681 Unsteadiness on feet: Secondary | ICD-10-CM | POA: Diagnosis not present

## 2023-06-04 DIAGNOSIS — I63441 Cerebral infarction due to embolism of right cerebellar artery: Secondary | ICD-10-CM | POA: Diagnosis not present

## 2023-06-04 DIAGNOSIS — M6281 Muscle weakness (generalized): Secondary | ICD-10-CM | POA: Diagnosis not present

## 2023-06-07 DIAGNOSIS — F32A Depression, unspecified: Secondary | ICD-10-CM | POA: Diagnosis not present

## 2023-06-07 DIAGNOSIS — M6281 Muscle weakness (generalized): Secondary | ICD-10-CM | POA: Diagnosis not present

## 2023-06-07 DIAGNOSIS — I63441 Cerebral infarction due to embolism of right cerebellar artery: Secondary | ICD-10-CM | POA: Diagnosis not present

## 2023-06-07 DIAGNOSIS — E109 Type 1 diabetes mellitus without complications: Secondary | ICD-10-CM | POA: Diagnosis not present

## 2023-06-07 DIAGNOSIS — I5022 Chronic systolic (congestive) heart failure: Secondary | ICD-10-CM | POA: Diagnosis not present

## 2023-06-07 DIAGNOSIS — R2681 Unsteadiness on feet: Secondary | ICD-10-CM | POA: Diagnosis not present

## 2023-06-07 DIAGNOSIS — I639 Cerebral infarction, unspecified: Secondary | ICD-10-CM | POA: Diagnosis not present

## 2023-06-08 DIAGNOSIS — I5022 Chronic systolic (congestive) heart failure: Secondary | ICD-10-CM | POA: Diagnosis not present

## 2023-06-08 DIAGNOSIS — D61818 Other pancytopenia: Secondary | ICD-10-CM | POA: Diagnosis not present

## 2023-06-08 DIAGNOSIS — E109 Type 1 diabetes mellitus without complications: Secondary | ICD-10-CM | POA: Diagnosis not present

## 2023-06-08 DIAGNOSIS — I639 Cerebral infarction, unspecified: Secondary | ICD-10-CM | POA: Diagnosis not present

## 2023-06-08 DIAGNOSIS — M6281 Muscle weakness (generalized): Secondary | ICD-10-CM | POA: Diagnosis not present

## 2023-06-08 DIAGNOSIS — I63441 Cerebral infarction due to embolism of right cerebellar artery: Secondary | ICD-10-CM | POA: Diagnosis not present

## 2023-06-08 DIAGNOSIS — C8310 Mantle cell lymphoma, unspecified site: Secondary | ICD-10-CM | POA: Diagnosis not present

## 2023-06-08 DIAGNOSIS — R2681 Unsteadiness on feet: Secondary | ICD-10-CM | POA: Diagnosis not present

## 2023-06-09 DIAGNOSIS — I63441 Cerebral infarction due to embolism of right cerebellar artery: Secondary | ICD-10-CM | POA: Diagnosis not present

## 2023-06-09 DIAGNOSIS — M6281 Muscle weakness (generalized): Secondary | ICD-10-CM | POA: Diagnosis not present

## 2023-06-09 DIAGNOSIS — R2681 Unsteadiness on feet: Secondary | ICD-10-CM | POA: Diagnosis not present

## 2023-06-10 DIAGNOSIS — E1110 Type 2 diabetes mellitus with ketoacidosis without coma: Secondary | ICD-10-CM | POA: Diagnosis not present

## 2023-06-10 DIAGNOSIS — M6281 Muscle weakness (generalized): Secondary | ICD-10-CM | POA: Diagnosis not present

## 2023-06-10 DIAGNOSIS — R2681 Unsteadiness on feet: Secondary | ICD-10-CM | POA: Diagnosis not present

## 2023-06-10 DIAGNOSIS — I63441 Cerebral infarction due to embolism of right cerebellar artery: Secondary | ICD-10-CM | POA: Diagnosis not present

## 2023-06-11 DIAGNOSIS — M6281 Muscle weakness (generalized): Secondary | ICD-10-CM | POA: Diagnosis not present

## 2023-06-11 DIAGNOSIS — I63441 Cerebral infarction due to embolism of right cerebellar artery: Secondary | ICD-10-CM | POA: Diagnosis not present

## 2023-06-11 DIAGNOSIS — R2681 Unsteadiness on feet: Secondary | ICD-10-CM | POA: Diagnosis not present

## 2023-06-12 DIAGNOSIS — M6281 Muscle weakness (generalized): Secondary | ICD-10-CM | POA: Diagnosis not present

## 2023-06-12 DIAGNOSIS — I63441 Cerebral infarction due to embolism of right cerebellar artery: Secondary | ICD-10-CM | POA: Diagnosis not present

## 2023-06-12 DIAGNOSIS — R2681 Unsteadiness on feet: Secondary | ICD-10-CM | POA: Diagnosis not present

## 2023-06-14 DIAGNOSIS — M6281 Muscle weakness (generalized): Secondary | ICD-10-CM | POA: Diagnosis not present

## 2023-06-14 DIAGNOSIS — I63441 Cerebral infarction due to embolism of right cerebellar artery: Secondary | ICD-10-CM | POA: Diagnosis not present

## 2023-06-14 DIAGNOSIS — R2681 Unsteadiness on feet: Secondary | ICD-10-CM | POA: Diagnosis not present

## 2023-06-15 DIAGNOSIS — R2681 Unsteadiness on feet: Secondary | ICD-10-CM | POA: Diagnosis not present

## 2023-06-15 DIAGNOSIS — M6281 Muscle weakness (generalized): Secondary | ICD-10-CM | POA: Diagnosis not present

## 2023-06-15 DIAGNOSIS — I63441 Cerebral infarction due to embolism of right cerebellar artery: Secondary | ICD-10-CM | POA: Diagnosis not present

## 2023-06-16 DIAGNOSIS — M6281 Muscle weakness (generalized): Secondary | ICD-10-CM | POA: Diagnosis not present

## 2023-06-16 DIAGNOSIS — R2681 Unsteadiness on feet: Secondary | ICD-10-CM | POA: Diagnosis not present

## 2023-06-16 DIAGNOSIS — I63441 Cerebral infarction due to embolism of right cerebellar artery: Secondary | ICD-10-CM | POA: Diagnosis not present

## 2023-06-17 DIAGNOSIS — I63441 Cerebral infarction due to embolism of right cerebellar artery: Secondary | ICD-10-CM | POA: Diagnosis not present

## 2023-06-17 DIAGNOSIS — R2681 Unsteadiness on feet: Secondary | ICD-10-CM | POA: Diagnosis not present

## 2023-06-17 DIAGNOSIS — M6281 Muscle weakness (generalized): Secondary | ICD-10-CM | POA: Diagnosis not present

## 2023-06-18 DIAGNOSIS — R2681 Unsteadiness on feet: Secondary | ICD-10-CM | POA: Diagnosis not present

## 2023-06-18 DIAGNOSIS — I63441 Cerebral infarction due to embolism of right cerebellar artery: Secondary | ICD-10-CM | POA: Diagnosis not present

## 2023-06-18 DIAGNOSIS — M6281 Muscle weakness (generalized): Secondary | ICD-10-CM | POA: Diagnosis not present

## 2023-06-21 DIAGNOSIS — I63441 Cerebral infarction due to embolism of right cerebellar artery: Secondary | ICD-10-CM | POA: Diagnosis not present

## 2023-06-21 DIAGNOSIS — M6281 Muscle weakness (generalized): Secondary | ICD-10-CM | POA: Diagnosis not present

## 2023-06-21 DIAGNOSIS — R2681 Unsteadiness on feet: Secondary | ICD-10-CM | POA: Diagnosis not present

## 2023-06-22 DIAGNOSIS — I63441 Cerebral infarction due to embolism of right cerebellar artery: Secondary | ICD-10-CM | POA: Diagnosis not present

## 2023-06-22 DIAGNOSIS — R2681 Unsteadiness on feet: Secondary | ICD-10-CM | POA: Diagnosis not present

## 2023-06-22 DIAGNOSIS — M6281 Muscle weakness (generalized): Secondary | ICD-10-CM | POA: Diagnosis not present

## 2023-06-23 ENCOUNTER — Ambulatory Visit: Payer: Medicare Other | Admitting: Emergency Medicine

## 2023-06-23 DIAGNOSIS — R2681 Unsteadiness on feet: Secondary | ICD-10-CM | POA: Diagnosis not present

## 2023-06-23 DIAGNOSIS — I63441 Cerebral infarction due to embolism of right cerebellar artery: Secondary | ICD-10-CM | POA: Diagnosis not present

## 2023-06-23 DIAGNOSIS — M6281 Muscle weakness (generalized): Secondary | ICD-10-CM | POA: Diagnosis not present

## 2023-06-23 NOTE — Progress Notes (Signed)
This encounter was created in error - please disregard.

## 2023-06-24 ENCOUNTER — Ambulatory Visit (INDEPENDENT_AMBULATORY_CARE_PROVIDER_SITE_OTHER): Payer: Medicare Other | Admitting: Emergency Medicine

## 2023-06-24 VITALS — Ht 69.0 in | Wt 159.0 lb

## 2023-06-24 DIAGNOSIS — R2681 Unsteadiness on feet: Secondary | ICD-10-CM | POA: Diagnosis not present

## 2023-06-24 DIAGNOSIS — R809 Proteinuria, unspecified: Secondary | ICD-10-CM | POA: Diagnosis not present

## 2023-06-24 DIAGNOSIS — Z0000 Encounter for general adult medical examination without abnormal findings: Secondary | ICD-10-CM

## 2023-06-24 DIAGNOSIS — I63441 Cerebral infarction due to embolism of right cerebellar artery: Secondary | ICD-10-CM | POA: Diagnosis not present

## 2023-06-24 DIAGNOSIS — E1029 Type 1 diabetes mellitus with other diabetic kidney complication: Secondary | ICD-10-CM

## 2023-06-24 DIAGNOSIS — M6281 Muscle weakness (generalized): Secondary | ICD-10-CM | POA: Diagnosis not present

## 2023-06-24 NOTE — Progress Notes (Signed)
Subjective:   Gary [name] is a 77 y.o. male who presents for Medicare Annual/Subsequent preventive examination.  Visit Complete: Virtual  I connected with  Gary [name] on 06/24/23 by a audio enabled telemedicine application and verified that I am speaking with the correct person using two identifiers.  Patient Location: Skilled Nursing Facility  Provider Location: Home Office  I discussed the limitations of evaluation and management by telemedicine. The patient expressed understanding and agreed to proceed.  Vital Signs: Because this visit was a virtual/telehealth visit, some criteria may be missing or patient reported. Any vitals not documented were not able to be obtained and vitals that have been documented are patient reported.    Review of Systems     Cardiac Risk Factors include: advanced age (>83men, >2 women);male gender;diabetes mellitus;hypertension;dyslipidemia;Other (see comment), Risk factor comments: CAD     Objective:    Today's Vitals   06/24/23 1544 06/24/23 1550  Weight: 159 lb (72.1 kg)   Height: 5\' 9"  (1.753 m)   PainSc:  2    Body mass index is 23.48 kg/m.     06/24/2023    4:12 PM 02/06/2023    5:00 PM 02/05/2023    7:06 PM 02/02/2023   11:16 AM 01/12/2023    2:55 PM 01/07/2023   10:51 AM 12/11/2022   10:22 AM  Advanced Directives  Does Patient Have a Medical Advance Directive? Yes Yes No No No No No  Type of Estate agent of [place];Living will --       Does patient want to make changes to medical advance directive? No - Patient declined No - Patient declined       Copy of Healthcare Power of Attorney in Chart? Yes - validated most recent copy scanned in chart (See row information)        Would patient like information on creating a medical advance directive?  No - Patient declined  No - Patient declined No - Patient declined No - Patient declined No - Guardian declined    Current Medications (verified) Outpatient Encounter  Medications as of 06/24/2023  Medication Sig   acetaminophen (TYLENOL) 650 MG CR tablet Take 650 mg by mouth every 8 (eight) hours as needed for pain.   aspirin EC 81 MG tablet Take 81 mg by mouth daily.   atorvastatin (LIPITOR) 40 MG tablet Take 40 mg by mouth daily.   Cholecalciferol (VITAMIN D3) 50 MCG (2000 UT) CAPS Take 2,000 Units by mouth daily.   cholestyramine (QUESTRAN) 4 g packet Take 1 packet (4 g total) by mouth 2 (two) times daily.   clotrimazole-betamethasone (LOTRISONE) cream Apply topically 2 (two) times daily.   feeding supplement, GLUCERNA SHAKE, (GLUCERNA SHAKE) LIQD Take 237 mLs by mouth 2 (two) times daily between meals.   ferrous sulfate 325 (65 FE) MG tablet Take 325 mg by mouth daily with breakfast.   FLUoxetine (PROZAC) 20 MG tablet Take 1 tablet (20 mg total) by mouth daily.   Fluoxetine HCl, PMDD, 20 MG TABS Take 1 tablet by mouth daily.   folic acid (FOLVITE) 1 MG tablet Take 1 tablet (1 mg total) by mouth daily.   furosemide (LASIX) 40 MG tablet Take 2 tablets in the morning, and 1 tablet in the evening.   glucose 4 GM chewable tablet Chew 1 tablet (4 g total) by mouth once as needed for low blood sugar.   insulin aspart (NOVOLOG) 100 UNIT/ML injection Inject 6 Units into the skin 3 (three)  times daily with meals.   insulin glargine-yfgn (SEMGLEE, YFGN,) 100 UNIT/ML Pen Inject 4 Units into the skin at bedtime.   lactobacillus (FLORANEX/LACTINEX) PACK Take 1 packet (1 g total) by mouth 3 (three) times daily with meals.   levothyroxine (SYNTHROID) 50 MCG tablet Take 1 tablet (50 mcg total) by mouth daily before breakfast.   loperamide (IMODIUM) 2 MG capsule Take 1 capsule (2 mg total) by mouth as needed for diarrhea or loose stools.   midodrine (PROAMATINE) 10 MG tablet Take 1 tablet (10 mg total) by mouth 3 (three) times daily with meals.   Multiple Vitamin (MULTIVITAMIN) tablet Take 1 tablet by mouth daily.   Na Sulfate-K Sulfate-Mg Sulf 17.5-3.13-1.6 GM/177ML SOLN  Take by mouth. Take as directed.   nystatin cream (MYCOSTATIN) APPLY TO AFFECTED AREA TWICE A DAY   pantoprazole (PROTONIX) 40 MG tablet Take 1 tablet (40 mg total) by mouth 2 (two) times daily before a meal.   Potassium Chloride ER 20 MEQ TBCR Take 1 tablet by mouth.   potassium chloride SA (KLOR-CON M) 20 MEQ tablet Take 1 tablet (20 mEq total) by mouth 2 (two) times daily.   pravastatin (PRAVACHOL) 40 MG tablet TAKE 1 TABLET BY MOUTH EVERY DAY   promethazine (PHENERGAN) 12.5 MG tablet Take 12.5 mg by mouth every 6 (six) hours as needed for nausea or vomiting.   senna (SENOKOT) 8.6 MG tablet Take 1 tablet by mouth daily.   thiamine (VITAMIN B1) 100 MG tablet Take 1 tablet by mouth daily.   vitamin B-12 (CYANOCOBALAMIN) 1000 MCG tablet Take 1,000 mcg by mouth daily.   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    Gastroparesis    Graves disease    Hypercholesterolemia    Hypertension    Mantle cell lymphoma (HCC) 07/2014   Peripheral neuropathy    Shingles    Past Surgical History:  Procedure Laterality Date   CHOLECYSTECTOMY     COLONOSCOPY WITH PROPOFOL N/A 02/04/2023   Procedure: COLONOSCOPY WITH PROPOFOL;  Surgeon: [name], MD;  Location: [facility];  Service: Gastroenterology;  Laterality: N/A;   ESOPHAGOGASTRODUODENOSCOPY (EGD) WITH PROPOFOL N/A 01/18/2023   Procedure: ESOPHAGOGASTRODUODENOSCOPY (EGD) WITH PROPOFOL;  Surgeon: Jaynie Collins, DO;  Location: [facility] END[facility]COPY;  Service: Gastroenterology;  Laterality: N/A;   NEPHRECTOMY     right   PACEMAKER INSERTION N/A  12/07/2017   Procedure: INSERTION PACEMAKER DUAL CHAMBER INITIAL INSERT;  Surgeon: Marcina Millard, MD;  Location: [facility] ORS;  Service: Cardiovascular;  Laterality: N/A;   PORTA CATH INSERTION N/A 11/03/2017   Procedure: PORTA CATH INSERTION;  Surgeon: Renford Dills, MD;  Location: [facility] INVASIVE CV LAB;  Service: Cardiovascular;  Laterality: N/A;   Family History  Problem Relation Age of Onset   Breast cancer Mother    Diabetes Father    Cancer Father        Lymphoma   Alcohol abuse Maternal Uncle    Diabetes Sister    Heart disease Other        grandfather   Social History   Socioeconomic History  Marital status: Widowed    Spouse name: Not on file   Number of children: 1   Years of education: 73   Highest education level: 12th grade  Occupational History   Occupation: Retired  Tobacco Use   Smoking status: Former    Current packs/day: 0.00    Average packs/day: 0.8 packs/day for 18.0 years (13.5 ttl pk-yrs)    Types: Cigarettes    Start date: 55    Quit date: 1979    Years since quitting: 45.7    Passive exposure: Past   Smokeless tobacco: Former    Types: Chew    Quit date: 01/2023   Tobacco comments:    Last uses chew in 2/3 months  Vaping Use   Vaping status: Never Used  Substance and Sexual Activity   Alcohol use: Yes    Alcohol/week: 6.0 standard drinks of alcohol    Types: 6 Cans of beer per week    Comment: 1-2 beers nightly 3-4 times per week   Drug use: Never   Sexual activity: Not Currently  Other Topics Concern   Not on file  Social History Narrative   06/24/23 Unable to verify medications as patient is in a rehab facility/pbt   Social Determinants of Health   Financial Resource Strain: Low Risk  (06/24/2023)   Overall Financial Resource Strain (CARDIA)    Difficulty of Paying Living Expenses: Not hard at all  Food Insecurity: No Food Insecurity (06/24/2023)   Hunger Vital Sign    Worried About Running Out of Food in the Last Year:  Never true    Ran Out of Food in the Last Year: Never true  Transportation Needs: No Transportation Needs (06/24/2023)   PRAPARE - Administrator, Civil Service (Medical): No    Lack of Transportation (Non-Medical): No  Physical Activity: Sufficiently Active (06/24/2023)   Exercise Vital Sign    Days of Exercise per Week: 5 days    Minutes of Exercise per Session: 60 min  Stress: No Stress Concern Present (06/24/2023)   [employer] of Occupational Health - Occupational Stress Questionnaire    Feeling of Stress : Only a little  Social Connections: Socially Isolated (06/24/2023)   Social Connection and Isolation Panel [NHANES]    Frequency of Communication with Friends and Family: Three times a week    Frequency of Social Gatherings with Friends and Family: Three times a week    Attends Religious Services: Never    Active Member of Clubs or Organizations: No    Attends Banker Meetings: Never    Marital Status: Widowed    Tobacco Counseling Counseling given: Not Answered Tobacco comments: Last uses chew in 2/3 months   Clinical Intake:  Pre-visit preparation completed: Yes  Pain : 0-10 Pain Score: 2  Pain Type: Chronic pain Pain Location: Other (Comment) (thumbs) Pain Descriptors / Indicators: Aching     BMI - recorded: 23.48 Nutritional Status: BMI of 19-24  Normal Nutritional Risks: Unintentional weight loss (since being in the hospital and rehab) Diabetes: Yes CBG done?: No (FBS 190 per patient) Did pt. bring in CBG monitor from home?: No  How often do you need to have someone help you when you read instructions, pamphlets, or other written materials from your doctor or pharmacy?: 1 - Never  Interpreter Needed?: No  Information entered by :: Tora Kindred, CMA   Activities of Daily Living    06/24/2023    3:59 PM 02/06/2023    5:00  PM  In your present state of health, do you have any difficulty performing the following activities:   Hearing? 0 0  Vision? 0 0  Difficulty concentrating or making decisions? 0 0  Walking or climbing stairs? 1 1  Comment walker and wheelchair   Dressing or bathing? 1 1  Comment needs assistance bathing   Doing errands, shopping? 1 0  Comment doesn't drive, Corporate investment banker and eating ? N   Using the Toilet? N   In the past six months, have you accidently leaked urine? N   Do you have problems with loss of bowel control? N   Managing your Medications? N   Managing your Finances? N   Housekeeping or managing your Housekeeping? Y   Comment sister helps     Patient Care Team: Dale Stuart, MD as PCP - General (Internal Medicine) Jeralyn Ruths, MD as Consulting Physician (Oncology) Wyn Quaker Marlow Baars, MD as Referring Physician (Vascular Surgery) Schnier, Latina Craver, MD (Vascular Surgery) Lamar Blinks, MD as Consulting Physician (Cardiology) Alwyn Pea, MD as Consulting Physician (Cardiology) [name], MD as Consulting Physician (Gastroenterology)  Indicate any recent Medical Services you may have received from other than [facility] providers in the past year (date may be approximate).     Assessment:   This is a routine wellness examination for [place].  Hearing/Vision screen Hearing Screening - Comments:: Denies hearing loss Vision Screening - Comments:: Gets eye exams   Goals Addressed               This Visit's Progress     Patient Stated (pt-stated)        Continue to build strength and get out of rehab      Depression Screen    06/24/2023    4:09 PM 01/12/2023    1:33 PM 08/13/2022   12:23 PM 05/11/2022    9:36 AM 03/10/2022    9:15 AM 03/07/2021    9:15 AM 12/06/2020    4:36 PM  PHQ 2/9 Scores  PHQ - 2 Score 1 5 2  0 0 0 1  PHQ- 9 Score 1 13 4         Fall Risk    06/24/2023    4:13 PM 11/13/2022   12:38 PM 08/13/2022   12:23 PM 05/11/2022    9:36 AM 03/10/2022    9:12 AM  Fall Risk   Falls in the past year? 1 0 1 0 1  Number falls in past  yr: 1 0 0 0 1  Comment     Sought medical care as needed. No falls since last reported in January.  Injury with Fall? 1 0 0 0   Risk for fall due to : History of fall(s);Impaired balance/gait;Impaired mobility;Orthopedic patient History of fall(s);Impaired balance/gait History of fall(s);Impaired mobility;Impaired balance/gait No Fall Risks Impaired balance/gait  Risk for fall due to: Comment     Cane/walker/walking stick in use  Follow up Falls evaluation completed;Education provided;Falls prevention discussed Falls evaluation completed Falls evaluation completed Falls evaluation completed Falls evaluation completed    MEDICARE RISK AT HOME: Medicare Risk at Home Any stairs in or around the home?: Yes If so, are there any without handrails?: No Home free of loose throw rugs in walkways, pet beds, electrical cords, etc?: No Adequate lighting in your home to reduce risk of falls?: Yes Life alert?: Yes Use of a cane, walker or w/c?: Yes Grab bars in the bathroom?: Yes Shower chair or bench in  shower?: No Elevated toilet seat or a handicapped toilet?: Yes  TIMED UP AND GO:  Was the test performed?  No    Cognitive Function:    10/22/2016   11:17 AM  MMSE - Mini Mental State Exam  Orientation to time 5  Orientation to Place 5  Registration 3  Attention/ Calculation 5  Recall 3  Language- name 2 objects 2  Language- repeat 1  Language- follow 3 step command 3  Language- read & follow direction 1  Write a sentence 1  Copy design 1  Total score 30        06/24/2023    4:15 PM 03/10/2022    9:16 AM 03/07/2021    9:18 AM 03/06/2020    9:24 AM 10/22/2017   12:16 PM  6CIT Screen  What Year? 0 points 0 points 0 points 0 points 0 points  What month? 0 points 0 points 0 points 0 points 0 points  What time? 0 points 0 points 0 points 0 points 0 points  Count back from 20 0 points 0 points 0 points 0 points 0 points  Months in reverse 0 points 0 points 0 points 0 points 0 points   Repeat phrase 0 points 0 points 0 points 2 points 0 points  Total Score 0 points 0 points 0 points 2 points 0 points    Immunizations Immunization History  Administered Date(s) Administered   Fluad Quad(high Dose 65+) 07/10/2019, 06/24/2020, 07/10/2021, 08/13/2022   Influenza Split 07/13/2014   Influenza, High Dose Seasonal PF 10/22/2016, 10/21/2017, 09/14/2018   Influenza, Seasonal, Injecte, Preservative Fre 08/27/2008   Influenza,inj,Quad PF,6+ Mos 06/20/2013, 08/09/2015   Influenza-Unspecified 06/20/2013, 08/09/2015   PPD Test 01/26/2023   Pneumococcal Conjugate-13 12/26/2014   Pneumococcal Polysaccharide-23 10/22/2017   Td 08/25/2021   Tdap 07/14/2018    TDAP status: Up to date  Flu Vaccine status: Due, Education has been provided regarding the importance of this vaccine. Advised may receive this vaccine at local pharmacy or Health Dept. Aware to provide a copy of the vaccination record if obtained from local pharmacy or Health Dept. Verbalized acceptance and understanding.  Pneumococcal vaccine status: Up to date  Covid-19 vaccine status: Declined, Education has been provided regarding the importance of this vaccine but patient still declined. Advised may receive this vaccine at local pharmacy or Health Dept.or vaccine clinic. Aware to provide a copy of the vaccination record if obtained from local pharmacy or Health Dept. Verbalized acceptance and understanding.  Qualifies for Shingles Vaccine? Yes   Zostavax completed No   Shingrix Completed?: No.    Education has been provided regarding the importance of this vaccine. Patient has been advised to call insurance company to determine out of pocket expense if they have not yet received this vaccine. Advised may also receive vaccine at local pharmacy or Health Dept. Verbalized acceptance and understanding.  Screening Tests Health Maintenance  Topic Date Due   COVID-19 Vaccine (1) Never done   Zoster Vaccines- Shingrix (1 of  2) Never done   OPHTHALMOLOGY EXAM  03/26/2023   Diabetic kidney evaluation - Urine ACR  05/12/2023   INFLUENZA VACCINE  05/13/2023   HEMOGLOBIN A1C  06/02/2023   FOOT EXAM  09/15/2023   Diabetic kidney evaluation - eGFR measurement  04/28/2024   Medicare Annual Wellness (AWV)  06/23/2024   DTaP/Tdap/Td (3 - Td or Tdap) 08/26/2031   Colonoscopy  02/03/2033   Pneumonia Vaccine 53+ Years old  Completed   Hepatitis C Screening  Completed   HPV VACCINES  Aged Out    Health Maintenance  Health Maintenance Due  Topic Date Due   COVID-19 Vaccine (1) Never done   Zoster Vaccines- Shingrix (1 of 2) Never done   OPHTHALMOLOGY EXAM  03/26/2023   Diabetic kidney evaluation - Urine ACR  05/12/2023   INFLUENZA VACCINE  05/13/2023   HEMOGLOBIN A1C  06/02/2023    Colorectal cancer screening: Type of screening: Colonoscopy. Completed 02/04/23. Repeat every 10 years  Lung Cancer Screening: (Low Dose CT Chest recommended if Age 22-80 years, 20 pack-year currently smoking OR have quit w/in 15years.) does not qualify.   Lung Cancer Screening Referral: n/a  Additional Screening:  Hepatitis C Screening: does not qualify; Completed 10/22/17  Vision Screening: Recommended annual ophthalmology exams for early detection of glaucoma and other disorders of the eye. Is the patient up to date with their annual eye exam?  No . Missed due to being in hospital and rehab Who is the provider or what is the name of the office in which the patient attends annual eye exams? Dr. Dellie Burns If pt is not established with a provider, would they like to be referred to a provider to establish care? No .   Dental Screening: Recommended annual dental exams for proper oral hygiene  Diabetic Foot Exam: Diabetic Foot Exam: Completed 09/14/22  Community Resource Referral / Chronic Care Management: CRR required this visit?  No   CCM required this visit?  No     Plan:     I have personally reviewed and noted the  following in the patient's chart:   Medical and social history Use of alcohol, tobacco or illicit drugs  Current medications and supplements including opioid prescriptions. Patient is not currently taking opioid prescriptions. Functional ability and status Nutritional status Physical activity Advanced directives List of other physicians Hospitalizations, surgeries, and ER visits in previous 12 months Vitals Screenings to include cognitive, depression, and falls Referrals and appointments  In addition, I have reviewed and discussed with patient certain preventive protocols, quality metrics, and best practice recommendations. A written personalized care plan for preventive services as well as general preventive health recommendations were provided to patient.     Tora Kindred, CMA   06/24/2023   After Visit Summary: (MyChart) Due to this being a telephonic visit, the after visit summary with patients personalized plan was offered to patient via MyChart   Nurse Notes:  Patient currently in skilled nursing home for rehab. Scheduled OV with Dr. Lorin Picket for 08/02/23.

## 2023-06-24 NOTE — Patient Instructions (Signed)
Gary [name] , Thank you for taking time to come for your Medicare Wellness Visit. I appreciate your ongoing commitment to your health goals. Please review the following plan we discussed and let me know if I can assist you in the future.   Referrals/Orders/Follow-Ups/Clinician Recommendations: Get the flu shot at your earliest convenience. I have scheduled you an appointment with Dr. Arline Asp on 08/02/23 @ 10am.  This is a list of the screening recommended for you and due dates:  Health Maintenance  Topic Date Due   COVID-19 Vaccine (1) Never done   Zoster (Shingles) Vaccine (1 of 2) Never done   Eye exam for diabetics  03/26/2023   Yearly kidney health urinalysis for diabetes  05/12/2023   Hemoglobin A1C  06/02/2023   Flu Shot  01/10/2024*   Complete foot exam   09/15/2023   Yearly kidney function blood test for diabetes  04/28/2024   Medicare Annual Wellness Visit  06/23/2024   DTaP/Tdap/Td vaccine (3 - Td or Tdap) 08/26/2031   Colon Cancer Screening  02/03/2033   Pneumonia Vaccine  Completed   Hepatitis C Screening  Completed   HPV Vaccine  Aged Out  *Topic was postponed. The date shown is not the original due date.    Advanced directives: (In Chart) A copy of your advanced directives are scanned into your chart should your provider ever need it.  Next Medicare Annual Wellness Visit scheduled for next year: Yes, 06/29/24 @ 3:45pm  Fall Prevention in the Home, Adult Falls can cause injuries and affect people of all ages. There are many simple things that you can do to make your home safe and to help prevent falls. If you need it, ask for help making these changes. What actions can I take to prevent falls? General information Use good lighting in all rooms. Make sure to: Replace any light bulbs that burn out. Turn on lights if it is dark and use night-lights. Keep items that you use often in easy-to-reach places. Lower the shelves around your home if needed. Move furniture so that  there are clear paths around it. Do not keep throw rugs or other things on the floor that can make you trip. If any of your floors are uneven, fix them. Add color or contrast paint or tape to clearly mark and help you see: Grab bars or handrails. First and last steps of staircases. Where the edge of each step is. If you use a ladder or stepladder: Make sure that it is fully opened. Do not climb a closed ladder. Make sure the sides of the ladder are locked in place. Have someone hold the ladder while you use it. Know where your pets are as you move through your home. What can I do in the bathroom?     Keep the floor dry. Clean up any water that is on the floor right away. Remove soap buildup in the bathtub or shower. Buildup makes bathtubs and showers slippery. Use non-skid mats or decals on the floor of the bathtub or shower. Attach bath mats securely with double-sided, non-slip rug tape. If you need to sit down while you are in the shower, use a non-slip stool. Install grab bars by the toilet and in the bathtub and shower. Do not use towel bars as grab bars. What can I do in the bedroom? Make sure that you have a light by your bed that is easy to reach. Do not use any sheets or blankets on your bed that hang  to the floor. Have a firm bench or chair with side arms that you can use for support when you get dressed. What can I do in the kitchen? Clean up any spills right away. If you need to reach something above you, use a sturdy step stool that has a grab bar. Keep electrical cables out of the way. Do not use floor polish or wax that makes floors slippery. What can I do with my stairs? Do not leave anything on the stairs. Make sure that you have a light switch at the top and the bottom of the stairs. Have them installed if you do not have them. Make sure that there are handrails on both sides of the stairs. Fix handrails that are broken or loose. Make sure that handrails are as long  as the staircases. Install non-slip stair treads on all stairs in your home if they do not have carpet. Avoid having throw rugs at the top or bottom of stairs, or secure the rugs with carpet tape to prevent them from moving. Choose a carpet design that does not hide the edge of steps on the stairs. Make sure that carpet is firmly attached to the stairs. Fix any carpet that is loose or worn. What can I do on the outside of my home? Use bright outdoor lighting. Repair the edges of walkways and driveways and fix any cracks. Clear paths of anything that can make you trip, such as tools or rocks. Add color or contrast paint or tape to clearly mark and help you see high doorway thresholds. Trim any bushes or trees on the main path into your home. Check that handrails are securely fastened and in good repair. Both sides of all steps should have handrails. Install guardrails along the edges of any raised decks or porches. Have leaves, snow, and ice cleared regularly. Use sand, salt, or ice melt on walkways during winter months if you live where there is ice and snow. In the garage, clean up any spills right away, including grease or oil spills. What other actions can I take? Review your medicines with your health care provider. Some medicines can make you confused or feel dizzy. This can increase your chance of falling. Wear closed-toe shoes that fit well and support your feet. Wear shoes that have rubber soles and low heels. Use a cane, walker, scooter, or crutches that help you move around if needed. Talk with your provider about other ways that you can decrease your risk of falls. This may include seeing a physical therapist to learn to do exercises to improve movement and strength. Where to find more information Centers for Disease Control and Prevention, STEADI: TonerPromos.no [employer] on Aging: BaseRingTones.pl National Institute on Aging: BaseRingTones.pl Contact a health care provider if: You are  afraid of falling at home. You feel weak, drowsy, or dizzy at home. You fall at home. Get help right away if you: Lose consciousness or have trouble moving after a fall. Have a fall that causes a head injury. These symptoms may be an emergency. Get help right away. Call 911. Do not wait to see if the symptoms will go away. Do not drive yourself to the hospital. This information is not intended to replace advice given to you by your health care provider. Make sure you discuss any questions you have with your health care provider. Document Revised: 06/01/2022 Document Reviewed: 06/01/2022 Elsevier Patient Education  2024 [employer].

## 2023-06-25 DIAGNOSIS — I639 Cerebral infarction, unspecified: Secondary | ICD-10-CM | POA: Diagnosis not present

## 2023-06-25 DIAGNOSIS — I63441 Cerebral infarction due to embolism of right cerebellar artery: Secondary | ICD-10-CM | POA: Diagnosis not present

## 2023-06-25 DIAGNOSIS — E109 Type 1 diabetes mellitus without complications: Secondary | ICD-10-CM | POA: Diagnosis not present

## 2023-06-25 DIAGNOSIS — I5022 Chronic systolic (congestive) heart failure: Secondary | ICD-10-CM | POA: Diagnosis not present

## 2023-06-25 DIAGNOSIS — S31801A Laceration without foreign body of unspecified buttock, initial encounter: Secondary | ICD-10-CM | POA: Diagnosis not present

## 2023-06-25 DIAGNOSIS — M6281 Muscle weakness (generalized): Secondary | ICD-10-CM | POA: Diagnosis not present

## 2023-06-25 DIAGNOSIS — R2681 Unsteadiness on feet: Secondary | ICD-10-CM | POA: Diagnosis not present

## 2023-06-28 DIAGNOSIS — M6281 Muscle weakness (generalized): Secondary | ICD-10-CM | POA: Diagnosis not present

## 2023-06-28 DIAGNOSIS — R2681 Unsteadiness on feet: Secondary | ICD-10-CM | POA: Diagnosis not present

## 2023-06-28 DIAGNOSIS — I63441 Cerebral infarction due to embolism of right cerebellar artery: Secondary | ICD-10-CM | POA: Diagnosis not present

## 2023-06-29 DIAGNOSIS — M6281 Muscle weakness (generalized): Secondary | ICD-10-CM | POA: Diagnosis not present

## 2023-06-29 DIAGNOSIS — I63441 Cerebral infarction due to embolism of right cerebellar artery: Secondary | ICD-10-CM | POA: Diagnosis not present

## 2023-06-29 DIAGNOSIS — R2681 Unsteadiness on feet: Secondary | ICD-10-CM | POA: Diagnosis not present

## 2023-06-30 DIAGNOSIS — I63441 Cerebral infarction due to embolism of right cerebellar artery: Secondary | ICD-10-CM | POA: Diagnosis not present

## 2023-06-30 DIAGNOSIS — M6281 Muscle weakness (generalized): Secondary | ICD-10-CM | POA: Diagnosis not present

## 2023-06-30 DIAGNOSIS — R2681 Unsteadiness on feet: Secondary | ICD-10-CM | POA: Diagnosis not present

## 2023-07-01 DIAGNOSIS — R2681 Unsteadiness on feet: Secondary | ICD-10-CM | POA: Diagnosis not present

## 2023-07-01 DIAGNOSIS — M6281 Muscle weakness (generalized): Secondary | ICD-10-CM | POA: Diagnosis not present

## 2023-07-01 DIAGNOSIS — I63441 Cerebral infarction due to embolism of right cerebellar artery: Secondary | ICD-10-CM | POA: Diagnosis not present

## 2023-07-02 DIAGNOSIS — R2681 Unsteadiness on feet: Secondary | ICD-10-CM | POA: Diagnosis not present

## 2023-07-02 DIAGNOSIS — M6281 Muscle weakness (generalized): Secondary | ICD-10-CM | POA: Diagnosis not present

## 2023-07-02 DIAGNOSIS — I63441 Cerebral infarction due to embolism of right cerebellar artery: Secondary | ICD-10-CM | POA: Diagnosis not present

## 2023-07-04 DIAGNOSIS — M6281 Muscle weakness (generalized): Secondary | ICD-10-CM | POA: Diagnosis not present

## 2023-07-04 DIAGNOSIS — R2681 Unsteadiness on feet: Secondary | ICD-10-CM | POA: Diagnosis not present

## 2023-07-04 DIAGNOSIS — I63441 Cerebral infarction due to embolism of right cerebellar artery: Secondary | ICD-10-CM | POA: Diagnosis not present

## 2023-07-05 DIAGNOSIS — M6281 Muscle weakness (generalized): Secondary | ICD-10-CM | POA: Diagnosis not present

## 2023-07-05 DIAGNOSIS — R2681 Unsteadiness on feet: Secondary | ICD-10-CM | POA: Diagnosis not present

## 2023-07-05 DIAGNOSIS — I63441 Cerebral infarction due to embolism of right cerebellar artery: Secondary | ICD-10-CM | POA: Diagnosis not present

## 2023-07-06 DIAGNOSIS — M6281 Muscle weakness (generalized): Secondary | ICD-10-CM | POA: Diagnosis not present

## 2023-07-06 DIAGNOSIS — R2681 Unsteadiness on feet: Secondary | ICD-10-CM | POA: Diagnosis not present

## 2023-07-06 DIAGNOSIS — I63441 Cerebral infarction due to embolism of right cerebellar artery: Secondary | ICD-10-CM | POA: Diagnosis not present

## 2023-07-07 DIAGNOSIS — I63441 Cerebral infarction due to embolism of right cerebellar artery: Secondary | ICD-10-CM | POA: Diagnosis not present

## 2023-07-07 DIAGNOSIS — M6281 Muscle weakness (generalized): Secondary | ICD-10-CM | POA: Diagnosis not present

## 2023-07-07 DIAGNOSIS — D61818 Other pancytopenia: Secondary | ICD-10-CM | POA: Diagnosis not present

## 2023-07-07 DIAGNOSIS — I639 Cerebral infarction, unspecified: Secondary | ICD-10-CM | POA: Diagnosis not present

## 2023-07-07 DIAGNOSIS — F32A Depression, unspecified: Secondary | ICD-10-CM | POA: Diagnosis not present

## 2023-07-07 DIAGNOSIS — E109 Type 1 diabetes mellitus without complications: Secondary | ICD-10-CM | POA: Diagnosis not present

## 2023-07-07 DIAGNOSIS — R2681 Unsteadiness on feet: Secondary | ICD-10-CM | POA: Diagnosis not present

## 2023-07-08 DIAGNOSIS — R2681 Unsteadiness on feet: Secondary | ICD-10-CM | POA: Diagnosis not present

## 2023-07-08 DIAGNOSIS — D649 Anemia, unspecified: Secondary | ICD-10-CM | POA: Diagnosis not present

## 2023-07-08 DIAGNOSIS — M6281 Muscle weakness (generalized): Secondary | ICD-10-CM | POA: Diagnosis not present

## 2023-07-08 DIAGNOSIS — I63441 Cerebral infarction due to embolism of right cerebellar artery: Secondary | ICD-10-CM | POA: Diagnosis not present

## 2023-07-09 DIAGNOSIS — F32A Depression, unspecified: Secondary | ICD-10-CM | POA: Diagnosis not present

## 2023-07-09 DIAGNOSIS — I63441 Cerebral infarction due to embolism of right cerebellar artery: Secondary | ICD-10-CM | POA: Diagnosis not present

## 2023-07-09 DIAGNOSIS — R2681 Unsteadiness on feet: Secondary | ICD-10-CM | POA: Diagnosis not present

## 2023-07-09 DIAGNOSIS — M6281 Muscle weakness (generalized): Secondary | ICD-10-CM | POA: Diagnosis not present

## 2023-07-09 DIAGNOSIS — I639 Cerebral infarction, unspecified: Secondary | ICD-10-CM | POA: Diagnosis not present

## 2023-07-09 DIAGNOSIS — D61818 Other pancytopenia: Secondary | ICD-10-CM | POA: Diagnosis not present

## 2023-07-09 DIAGNOSIS — L97512 Non-pressure chronic ulcer of other part of right foot with fat layer exposed: Secondary | ICD-10-CM | POA: Diagnosis not present

## 2023-07-09 DIAGNOSIS — C8310 Mantle cell lymphoma, unspecified site: Secondary | ICD-10-CM | POA: Diagnosis not present

## 2023-07-12 DIAGNOSIS — M6281 Muscle weakness (generalized): Secondary | ICD-10-CM | POA: Diagnosis not present

## 2023-07-12 DIAGNOSIS — R2681 Unsteadiness on feet: Secondary | ICD-10-CM | POA: Diagnosis not present

## 2023-07-12 DIAGNOSIS — I63441 Cerebral infarction due to embolism of right cerebellar artery: Secondary | ICD-10-CM | POA: Diagnosis not present

## 2023-07-13 DIAGNOSIS — C8310 Mantle cell lymphoma, unspecified site: Secondary | ICD-10-CM | POA: Diagnosis not present

## 2023-07-13 DIAGNOSIS — R2681 Unsteadiness on feet: Secondary | ICD-10-CM | POA: Diagnosis not present

## 2023-07-13 DIAGNOSIS — I63441 Cerebral infarction due to embolism of right cerebellar artery: Secondary | ICD-10-CM | POA: Diagnosis not present

## 2023-07-13 DIAGNOSIS — E109 Type 1 diabetes mellitus without complications: Secondary | ICD-10-CM | POA: Diagnosis not present

## 2023-07-13 DIAGNOSIS — Z23 Encounter for immunization: Secondary | ICD-10-CM | POA: Diagnosis not present

## 2023-07-13 DIAGNOSIS — D61818 Other pancytopenia: Secondary | ICD-10-CM | POA: Diagnosis not present

## 2023-07-13 DIAGNOSIS — M6281 Muscle weakness (generalized): Secondary | ICD-10-CM | POA: Diagnosis not present

## 2023-07-14 DIAGNOSIS — F32A Depression, unspecified: Secondary | ICD-10-CM | POA: Diagnosis not present

## 2023-07-14 DIAGNOSIS — M6281 Muscle weakness (generalized): Secondary | ICD-10-CM | POA: Diagnosis not present

## 2023-07-14 DIAGNOSIS — R2681 Unsteadiness on feet: Secondary | ICD-10-CM | POA: Diagnosis not present

## 2023-07-14 DIAGNOSIS — I63441 Cerebral infarction due to embolism of right cerebellar artery: Secondary | ICD-10-CM | POA: Diagnosis not present

## 2023-07-15 DIAGNOSIS — R2681 Unsteadiness on feet: Secondary | ICD-10-CM | POA: Diagnosis not present

## 2023-07-15 DIAGNOSIS — I63441 Cerebral infarction due to embolism of right cerebellar artery: Secondary | ICD-10-CM | POA: Diagnosis not present

## 2023-07-15 DIAGNOSIS — L97512 Non-pressure chronic ulcer of other part of right foot with fat layer exposed: Secondary | ICD-10-CM | POA: Diagnosis not present

## 2023-07-15 DIAGNOSIS — M6281 Muscle weakness (generalized): Secondary | ICD-10-CM | POA: Diagnosis not present

## 2023-07-16 DIAGNOSIS — I63441 Cerebral infarction due to embolism of right cerebellar artery: Secondary | ICD-10-CM | POA: Diagnosis not present

## 2023-07-16 DIAGNOSIS — R2681 Unsteadiness on feet: Secondary | ICD-10-CM | POA: Diagnosis not present

## 2023-07-16 DIAGNOSIS — M6281 Muscle weakness (generalized): Secondary | ICD-10-CM | POA: Diagnosis not present

## 2023-07-19 DIAGNOSIS — I63441 Cerebral infarction due to embolism of right cerebellar artery: Secondary | ICD-10-CM | POA: Diagnosis not present

## 2023-07-19 DIAGNOSIS — R2681 Unsteadiness on feet: Secondary | ICD-10-CM | POA: Diagnosis not present

## 2023-07-19 DIAGNOSIS — M6281 Muscle weakness (generalized): Secondary | ICD-10-CM | POA: Diagnosis not present

## 2023-07-20 DIAGNOSIS — I63441 Cerebral infarction due to embolism of right cerebellar artery: Secondary | ICD-10-CM | POA: Diagnosis not present

## 2023-07-20 DIAGNOSIS — M6281 Muscle weakness (generalized): Secondary | ICD-10-CM | POA: Diagnosis not present

## 2023-07-20 DIAGNOSIS — R2681 Unsteadiness on feet: Secondary | ICD-10-CM | POA: Diagnosis not present

## 2023-07-21 DIAGNOSIS — E10649 Type 1 diabetes mellitus with hypoglycemia without coma: Secondary | ICD-10-CM | POA: Diagnosis not present

## 2023-07-21 DIAGNOSIS — I152 Hypertension secondary to endocrine disorders: Secondary | ICD-10-CM | POA: Diagnosis not present

## 2023-07-21 DIAGNOSIS — E785 Hyperlipidemia, unspecified: Secondary | ICD-10-CM | POA: Diagnosis not present

## 2023-07-21 DIAGNOSIS — E1069 Type 1 diabetes mellitus with other specified complication: Secondary | ICD-10-CM | POA: Diagnosis not present

## 2023-07-21 DIAGNOSIS — M6281 Muscle weakness (generalized): Secondary | ICD-10-CM | POA: Diagnosis not present

## 2023-07-21 DIAGNOSIS — R2681 Unsteadiness on feet: Secondary | ICD-10-CM | POA: Diagnosis not present

## 2023-07-21 DIAGNOSIS — I63441 Cerebral infarction due to embolism of right cerebellar artery: Secondary | ICD-10-CM | POA: Diagnosis not present

## 2023-07-22 DIAGNOSIS — M6281 Muscle weakness (generalized): Secondary | ICD-10-CM | POA: Diagnosis not present

## 2023-07-22 DIAGNOSIS — R2681 Unsteadiness on feet: Secondary | ICD-10-CM | POA: Diagnosis not present

## 2023-07-22 DIAGNOSIS — L97512 Non-pressure chronic ulcer of other part of right foot with fat layer exposed: Secondary | ICD-10-CM | POA: Diagnosis not present

## 2023-07-22 DIAGNOSIS — I63441 Cerebral infarction due to embolism of right cerebellar artery: Secondary | ICD-10-CM | POA: Diagnosis not present

## 2023-07-23 DIAGNOSIS — M6281 Muscle weakness (generalized): Secondary | ICD-10-CM | POA: Diagnosis not present

## 2023-07-23 DIAGNOSIS — I63441 Cerebral infarction due to embolism of right cerebellar artery: Secondary | ICD-10-CM | POA: Diagnosis not present

## 2023-07-23 DIAGNOSIS — R2681 Unsteadiness on feet: Secondary | ICD-10-CM | POA: Diagnosis not present

## 2023-07-26 DIAGNOSIS — I63441 Cerebral infarction due to embolism of right cerebellar artery: Secondary | ICD-10-CM | POA: Diagnosis not present

## 2023-07-26 DIAGNOSIS — R2681 Unsteadiness on feet: Secondary | ICD-10-CM | POA: Diagnosis not present

## 2023-07-26 DIAGNOSIS — M6281 Muscle weakness (generalized): Secondary | ICD-10-CM | POA: Diagnosis not present

## 2023-07-27 DIAGNOSIS — I5022 Chronic systolic (congestive) heart failure: Secondary | ICD-10-CM | POA: Diagnosis not present

## 2023-07-27 DIAGNOSIS — D61818 Other pancytopenia: Secondary | ICD-10-CM | POA: Diagnosis not present

## 2023-07-27 DIAGNOSIS — C8310 Mantle cell lymphoma, unspecified site: Secondary | ICD-10-CM | POA: Diagnosis not present

## 2023-07-27 DIAGNOSIS — E109 Type 1 diabetes mellitus without complications: Secondary | ICD-10-CM | POA: Diagnosis not present

## 2023-07-27 DIAGNOSIS — I639 Cerebral infarction, unspecified: Secondary | ICD-10-CM | POA: Diagnosis not present

## 2023-07-27 DIAGNOSIS — R2681 Unsteadiness on feet: Secondary | ICD-10-CM | POA: Diagnosis not present

## 2023-07-27 DIAGNOSIS — F32A Depression, unspecified: Secondary | ICD-10-CM | POA: Diagnosis not present

## 2023-07-27 DIAGNOSIS — E039 Hypothyroidism, unspecified: Secondary | ICD-10-CM | POA: Diagnosis not present

## 2023-07-27 DIAGNOSIS — E785 Hyperlipidemia, unspecified: Secondary | ICD-10-CM | POA: Diagnosis not present

## 2023-07-27 DIAGNOSIS — M6281 Muscle weakness (generalized): Secondary | ICD-10-CM | POA: Diagnosis not present

## 2023-07-27 DIAGNOSIS — I63441 Cerebral infarction due to embolism of right cerebellar artery: Secondary | ICD-10-CM | POA: Diagnosis not present

## 2023-07-28 DIAGNOSIS — R2681 Unsteadiness on feet: Secondary | ICD-10-CM | POA: Diagnosis not present

## 2023-07-28 DIAGNOSIS — E119 Type 2 diabetes mellitus without complications: Secondary | ICD-10-CM | POA: Diagnosis not present

## 2023-07-28 DIAGNOSIS — M6281 Muscle weakness (generalized): Secondary | ICD-10-CM | POA: Diagnosis not present

## 2023-07-28 DIAGNOSIS — I63441 Cerebral infarction due to embolism of right cerebellar artery: Secondary | ICD-10-CM | POA: Diagnosis not present

## 2023-07-29 DIAGNOSIS — I63441 Cerebral infarction due to embolism of right cerebellar artery: Secondary | ICD-10-CM | POA: Diagnosis not present

## 2023-07-29 DIAGNOSIS — R2681 Unsteadiness on feet: Secondary | ICD-10-CM | POA: Diagnosis not present

## 2023-07-29 DIAGNOSIS — M6281 Muscle weakness (generalized): Secondary | ICD-10-CM | POA: Diagnosis not present

## 2023-07-30 DIAGNOSIS — I63441 Cerebral infarction due to embolism of right cerebellar artery: Secondary | ICD-10-CM | POA: Diagnosis not present

## 2023-07-30 DIAGNOSIS — R2681 Unsteadiness on feet: Secondary | ICD-10-CM | POA: Diagnosis not present

## 2023-07-30 DIAGNOSIS — M6281 Muscle weakness (generalized): Secondary | ICD-10-CM | POA: Diagnosis not present

## 2023-07-30 DIAGNOSIS — E109 Type 1 diabetes mellitus without complications: Secondary | ICD-10-CM | POA: Diagnosis not present

## 2023-07-30 DIAGNOSIS — F32A Depression, unspecified: Secondary | ICD-10-CM | POA: Diagnosis not present

## 2023-07-30 DIAGNOSIS — C8310 Mantle cell lymphoma, unspecified site: Secondary | ICD-10-CM | POA: Diagnosis not present

## 2023-07-30 DIAGNOSIS — D61818 Other pancytopenia: Secondary | ICD-10-CM | POA: Diagnosis not present

## 2023-08-02 ENCOUNTER — Ambulatory Visit: Payer: 59 | Admitting: Internal Medicine

## 2023-08-02 ENCOUNTER — Encounter: Payer: Self-pay | Admitting: Internal Medicine

## 2023-08-02 DIAGNOSIS — I63441 Cerebral infarction due to embolism of right cerebellar artery: Secondary | ICD-10-CM | POA: Diagnosis not present

## 2023-08-02 DIAGNOSIS — R2681 Unsteadiness on feet: Secondary | ICD-10-CM | POA: Diagnosis not present

## 2023-08-02 DIAGNOSIS — M6281 Muscle weakness (generalized): Secondary | ICD-10-CM | POA: Diagnosis not present

## 2023-08-02 NOTE — Assessment & Plan Note (Signed)
History of diabetes, hypertension, CKD, hypercholesterolemia, CHF and neuropathy. 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 Is also being followed for stage III recurrent mantle cell lymphoma.  Seeing Dr Orlie Dakin.  Last evaluated 04/29/23. CT scan results from March 31, 2023 with no obvious evidence of recurrent or progressive disease. No intervention needed.  Return to clinic in 6 months with repeat imaging and further evaluation No changes made. Currently in SNF. Was evaluated by Dr Tobi Bastos 05/27/23 - previous colonoscopy revealed non specific colitis.  Diarrhea resolved after stopping protein shakes. Admitted 05/16/23 - 05/18/23 - with DKA.

## 2023-08-02 NOTE — Progress Notes (Signed)
Patient ID: KWAN DUN, male   DOB: [birth year], 77 y.o.   MRN: [id number] Did not show for appt

## 2023-08-02 NOTE — Progress Notes (Deleted)
Subjective:    [name], male    DOB: 04-Feb-1946, 77 y.o.   MRN: [id number]  Patient here for No chief complaint on file.   HPI Here to follow up regarding diabetes, hypertension, CKD, hypercholesterolemia, CHF and neuropathy. 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 Is also being followed for stage III recurrent mantle cell lymphoma.  Seeing Dr Orlie Dakin.  Last evaluated 04/29/23. CT scan results from March 31, 2023 with no obvious evidence of recurrent or progressive disease. No intervention needed.  Return to clinic in 6 months with repeat imaging and further evaluation No changes made. Currently in SNF. Was evaluated by Dr Tobi Bastos 05/27/23 - previous colonoscopy revealed non specific colitis.  Diarrhea resolved after stopping protein shakes. Admitted 05/16/23 - 05/18/23 - with DKA.    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    Gastroparesis    Graves disease    Hypercholesterolemia    Hypertension    Mantle cell lymphoma (HCC) 07/2014   Peripheral neuropathy    Shingles    Past Surgical History:  Procedure Laterality Date   CHOLECYSTECTOMY     COLONOSCOPY WITH PROPOFOL N/A 02/04/2023   Procedure: COLONOSCOPY WITH PROPOFOL;  Surgeon: [name], MD;  Location: [facility] ENDOSCOPY;  Service: Gastroenterology;  Laterality: N/A;   ESOPHAGOGASTRODUODENOSCOPY (EGD) WITH PROPOFOL N/A 01/18/2023   Procedure: ESOPHAGOGASTRODUODENOSCOPY (EGD) WITH PROPOFOL;  Surgeon: Jaynie Collins, DO;  Location: [facility] ENDOSCOPY;  Service: Gastroenterology;  Laterality: N/A;   NEPHRECTOMY     right   PACEMAKER INSERTION N/A 12/07/2017   Procedure: INSERTION PACEMAKER DUAL CHAMBER INITIAL INSERT;  Surgeon: Marcina Millard, MD;  Location: [facility] ORS;  Service: Cardiovascular;  Laterality: N/A;   PORTA CATH INSERTION N/A 11/03/2017   Procedure:  PORTA CATH INSERTION;  Surgeon: Renford Dills, MD;  Location: [facility] INVASIVE CV LAB;  Service: Cardiovascular;  Laterality: N/A;   Family History  Problem Relation Age of Onset   Breast cancer Mother    Diabetes Father    Cancer Father        Lymphoma   Alcohol abuse Maternal Uncle    Diabetes Sister    Heart disease Other        grandfather   Social History   Socioeconomic History   Marital status: Widowed    Spouse name: Not on file   Number of children: 1   Years of education: 44   Highest education level: 12th grade  Occupational History   Occupation: Retired  Tobacco Use   Smoking status: Former    Current packs/day: 0.00    Average packs/day: 0.8 packs/day for 18.0 years (13.5 ttl pk-yrs)    Types: Cigarettes    Start date: 45    Quit date: 1979    Years since quitting: 45.8    Passive exposure: Past   Smokeless tobacco: Former    Types: Chew    Quit date: 01/2023   Tobacco comments:    Last uses chew in 2/3 months  Vaping Use   Vaping status: Never Used  Substance and Sexual Activity   Alcohol use: Yes    Alcohol/week: 6.0 standard drinks of alcohol    Types: 6 Cans of beer per week    Comment: 1-2 beers nightly 3-4 times per week   Drug use: Never   Sexual activity: Not  Currently  Other Topics Concern   Not on file  Social History Narrative   06/24/23 Unable to verify medications as patient is in a rehab facility/pbt   Social Determinants of Health   Financial Resource Strain: Low Risk  (06/24/2023)   Overall Financial Resource Strain (CARDIA)    Difficulty of Paying Living Expenses: Not hard at all  Food Insecurity: No Food Insecurity (06/24/2023)   Hunger Vital Sign    Worried About Running Out of Food in the Last Year: Never true    Ran Out of Food in the Last Year: Never true  Transportation Needs: No Transportation Needs (06/24/2023)   PRAPARE - Administrator, Civil Service (Medical): No    Lack of Transportation (Non-Medical):  No  Physical Activity: Sufficiently Active (06/24/2023)   Exercise Vital Sign    Days of Exercise per Week: 5 days    Minutes of Exercise per Session: 60 min  Stress: No Stress Concern Present (06/24/2023)   [employer] of Occupational Health - Occupational Stress Questionnaire    Feeling of Stress : Only a little  Social Connections: Socially Isolated (06/24/2023)   Social Connection and Isolation Panel [NHANES]    Frequency of Communication with Friends and Family: Three times a week    Frequency of Social Gatherings with Friends and Family: Three times a week    Attends Religious Services: Never    Active Member of Clubs or Organizations: No    Attends Banker Meetings: Never    Marital Status: Widowed     Review of Systems     Objective:     There were no vitals taken for this visit. Wt Readings from Last 3 Encounters:  06/24/23 159 lb (72.1 kg)  04/09/23 201 lb (91.2 kg)  02/06/23 201 lb 1 oz (91.2 kg)    Physical Exam   Outpatient Encounter Medications as of 08/02/2023  Medication Sig   acetaminophen (TYLENOL) 650 MG CR tablet Take 650 mg by mouth every 8 (eight) hours as needed for pain.   aspirin EC 81 MG tablet Take 81 mg by mouth daily.   atorvastatin (LIPITOR) 40 MG tablet Take 40 mg by mouth daily.   Cholecalciferol (VITAMIN D3) 50 MCG (2000 UT) CAPS Take 2,000 Units by mouth daily.   cholestyramine (QUESTRAN) 4 g packet Take 1 packet (4 g total) by mouth 2 (two) times daily.   clotrimazole-betamethasone (LOTRISONE) cream Apply topically 2 (two) times daily.   feeding supplement, GLUCERNA SHAKE, (GLUCERNA SHAKE) LIQD Take 237 mLs by mouth 2 (two) times daily between meals.   ferrous sulfate 325 (65 FE) MG tablet Take 325 mg by mouth daily with breakfast.   FLUoxetine (PROZAC) 20 MG tablet Take 1 tablet (20 mg total) by mouth daily.   Fluoxetine HCl, PMDD, 20 MG TABS Take 1 tablet by mouth daily.   folic acid (FOLVITE) 1 MG tablet Take 1  tablet (1 mg total) by mouth daily.   furosemide (LASIX) 40 MG tablet Take 2 tablets in the morning, and 1 tablet in the evening.   glucose 4 GM chewable tablet Chew 1 tablet (4 g total) by mouth once as needed for low blood sugar.   insulin aspart (NOVOLOG) 100 UNIT/ML injection Inject 6 Units into the skin 3 (three) times daily with meals.   insulin glargine-yfgn (SEMGLEE, YFGN,) 100 UNIT/ML Pen Inject 4 Units into the skin at bedtime.   lactobacillus (FLORANEX/LACTINEX) PACK Take 1 packet (1 g total) by mouth  3 (three) times daily with meals.   levothyroxine (SYNTHROID) 50 MCG tablet Take 1 tablet (50 mcg total) by mouth daily before breakfast.   loperamide (IMODIUM) 2 MG capsule Take 1 capsule (2 mg total) by mouth as needed for diarrhea or loose stools.   midodrine (PROAMATINE) 10 MG tablet Take 1 tablet (10 mg total) by mouth 3 (three) times daily with meals.   Multiple Vitamin (MULTIVITAMIN) tablet Take 1 tablet by mouth daily.   Na Sulfate-K Sulfate-Mg Sulf 17.5-3.13-1.6 GM/177ML SOLN Take by mouth. Take as directed.   nystatin cream (MYCOSTATIN) APPLY TO AFFECTED AREA TWICE A DAY   pantoprazole (PROTONIX) 40 MG tablet Take 1 tablet (40 mg total) by mouth 2 (two) times daily before a meal.   Potassium Chloride ER 20 MEQ TBCR Take 1 tablet by mouth.   potassium chloride SA (KLOR-CON M) 20 MEQ tablet Take 1 tablet (20 mEq total) by mouth 2 (two) times daily.   pravastatin (PRAVACHOL) 40 MG tablet TAKE 1 TABLET BY MOUTH EVERY DAY   promethazine (PHENERGAN) 12.5 MG tablet Take 12.5 mg by mouth every 6 (six) hours as needed for nausea or vomiting.   senna (SENOKOT) 8.6 MG tablet Take 1 tablet by mouth daily.   thiamine (VITAMIN B1) 100 MG tablet Take 1 tablet by mouth daily.   vitamin B-12 (CYANOCOBALAMIN) 1000 MCG tablet Take 1,000 mcg by mouth daily.   Facility-Administered Encounter Medications as of 08/02/2023  Medication   heparin lock flush 100 unit/mL   sodium chloride flush (NS)  0.9 % injection 10 mL   sodium chloride flush (NS) 0.9 % injection 10 mL     Lab Results  Component Value Date   WBC 3.2 (L) 04/29/2023   HGB 9.8 (L) 04/29/2023   HCT 30.6 (L) 04/29/2023   PLT 104 (L) 04/29/2023   GLUCOSE 234 (H) 04/29/2023   CHOL 123 05/11/2022   TRIG 52.0 05/11/2022   HDL 52.90 05/11/2022   LDLCALC 60 05/11/2022   ALT 14 04/29/2023   AST 28 04/29/2023   NA 138 04/29/2023   K 4.0 04/29/2023   CL 101 04/29/2023   CREATININE 1.30 (H) 04/29/2023   BUN 18 04/29/2023   CO2 26 04/29/2023   TSH 5.824 (H) 02/06/2023   PSA 1.98 05/11/2022   INR 1.4 (H) 02/05/2023   HGBA1C 6.4 12/02/2022   MICROALBUR <0.7 05/11/2022    DG Cervical Spine Complete  Result Date: 05/15/2023 CLINICAL DATA:  Previous fracture EXAM: CERVICAL SPINE - COMPLETE 4 VIEW COMPARISON:  X-ray outside institution 03/05/2023, images only. CT 02/05/2023 FINDINGS: Kyphosis. Osteopenia. Preserved vertebral body height. Mild disc height loss at C6-7. Mild scattered endplate osteophytes identified from C4 through C7. Scattered facet degenerative changes. Hypertrophic changes of the dens C1 interval. Previous distraction injury at C6-7 by CT is not well seen on this x-ray. There is slight widening of the disc space at C5-6 and along the facet joint however. Similar to previous. Trace listhesis seen at multiple levels particularly at C3-4, C4-5. Preserved prevertebral soft tissues. At the edge of the imaging field note is made of a right upper chest port. Left-sided battery pack for pacemaker. Please correlate with prior imaging. IMPRESSION: Osteopenia with degenerative changes. Previous injury at C6-7 is not as well appreciated. However there is stable widening of the disc space at C5-6. Additional widening along the facet joint at this level. Electronically Signed   By: Karen Kays M.D.   On: 05/15/2023 19:02  Assessment & Plan:  There are no diagnoses linked to this encounter.   Dale Easthampton, MD

## 2023-08-03 DIAGNOSIS — E785 Hyperlipidemia, unspecified: Secondary | ICD-10-CM | POA: Diagnosis not present

## 2023-08-03 DIAGNOSIS — I639 Cerebral infarction, unspecified: Secondary | ICD-10-CM | POA: Diagnosis not present

## 2023-08-03 DIAGNOSIS — R2681 Unsteadiness on feet: Secondary | ICD-10-CM | POA: Diagnosis not present

## 2023-08-03 DIAGNOSIS — I5022 Chronic systolic (congestive) heart failure: Secondary | ICD-10-CM | POA: Diagnosis not present

## 2023-08-03 DIAGNOSIS — E109 Type 1 diabetes mellitus without complications: Secondary | ICD-10-CM | POA: Diagnosis not present

## 2023-08-03 DIAGNOSIS — I63441 Cerebral infarction due to embolism of right cerebellar artery: Secondary | ICD-10-CM | POA: Diagnosis not present

## 2023-08-03 DIAGNOSIS — M6281 Muscle weakness (generalized): Secondary | ICD-10-CM | POA: Diagnosis not present

## 2023-08-04 DIAGNOSIS — R2681 Unsteadiness on feet: Secondary | ICD-10-CM | POA: Diagnosis not present

## 2023-08-04 DIAGNOSIS — I639 Cerebral infarction, unspecified: Secondary | ICD-10-CM | POA: Diagnosis not present

## 2023-08-04 DIAGNOSIS — M6281 Muscle weakness (generalized): Secondary | ICD-10-CM | POA: Diagnosis not present

## 2023-08-04 DIAGNOSIS — I5022 Chronic systolic (congestive) heart failure: Secondary | ICD-10-CM | POA: Diagnosis not present

## 2023-08-04 DIAGNOSIS — K5900 Constipation, unspecified: Secondary | ICD-10-CM | POA: Diagnosis not present

## 2023-08-04 DIAGNOSIS — D649 Anemia, unspecified: Secondary | ICD-10-CM | POA: Diagnosis not present

## 2023-08-04 DIAGNOSIS — I63441 Cerebral infarction due to embolism of right cerebellar artery: Secondary | ICD-10-CM | POA: Diagnosis not present

## 2023-08-04 DIAGNOSIS — E109 Type 1 diabetes mellitus without complications: Secondary | ICD-10-CM | POA: Diagnosis not present

## 2023-08-05 DIAGNOSIS — I63441 Cerebral infarction due to embolism of right cerebellar artery: Secondary | ICD-10-CM | POA: Diagnosis not present

## 2023-08-05 DIAGNOSIS — M6281 Muscle weakness (generalized): Secondary | ICD-10-CM | POA: Diagnosis not present

## 2023-08-05 DIAGNOSIS — R2681 Unsteadiness on feet: Secondary | ICD-10-CM | POA: Diagnosis not present

## 2023-08-06 ENCOUNTER — Telehealth: Payer: Self-pay

## 2023-08-06 DIAGNOSIS — I63441 Cerebral infarction due to embolism of right cerebellar artery: Secondary | ICD-10-CM | POA: Diagnosis not present

## 2023-08-06 DIAGNOSIS — M6281 Muscle weakness (generalized): Secondary | ICD-10-CM | POA: Diagnosis not present

## 2023-08-06 DIAGNOSIS — R2681 Unsteadiness on feet: Secondary | ICD-10-CM | POA: Diagnosis not present

## 2023-08-06 NOTE — Telephone Encounter (Signed)
Patient scheduled a virtual visit via MyChart for 09/16/2023 at 3:30pm with the following message:  "I had an appointment this past Monday but the nursing home informed me that they only scheduled rides for specialty types of appointments like diabetes and cancer. I didn't want to loose Dr. Lorin Picket as my doctor and thought that maybe a video appointment would work. Thank you."

## 2023-08-09 ENCOUNTER — Other Ambulatory Visit (HOSPITAL_COMMUNITY): Payer: 59

## 2023-08-09 DIAGNOSIS — N179 Acute kidney failure, unspecified: Secondary | ICD-10-CM | POA: Diagnosis not present

## 2023-08-09 DIAGNOSIS — M6281 Muscle weakness (generalized): Secondary | ICD-10-CM | POA: Diagnosis not present

## 2023-08-09 DIAGNOSIS — R2681 Unsteadiness on feet: Secondary | ICD-10-CM | POA: Diagnosis not present

## 2023-08-09 DIAGNOSIS — I63441 Cerebral infarction due to embolism of right cerebellar artery: Secondary | ICD-10-CM | POA: Diagnosis not present

## 2023-08-09 NOTE — Telephone Encounter (Signed)
He is currently residing in SNF with MD at facility taking care of him.  Will need to confirm if covered.

## 2023-08-09 NOTE — Telephone Encounter (Signed)
Just want to confirm with you before calling. He is followed by MD at facility. He cannot continue to have you as his pcp, correct?

## 2023-08-10 DIAGNOSIS — M6281 Muscle weakness (generalized): Secondary | ICD-10-CM | POA: Diagnosis not present

## 2023-08-10 DIAGNOSIS — I63441 Cerebral infarction due to embolism of right cerebellar artery: Secondary | ICD-10-CM | POA: Diagnosis not present

## 2023-08-10 DIAGNOSIS — R2681 Unsteadiness on feet: Secondary | ICD-10-CM | POA: Diagnosis not present

## 2023-08-11 DIAGNOSIS — R2681 Unsteadiness on feet: Secondary | ICD-10-CM | POA: Diagnosis not present

## 2023-08-11 DIAGNOSIS — I63441 Cerebral infarction due to embolism of right cerebellar artery: Secondary | ICD-10-CM | POA: Diagnosis not present

## 2023-08-11 DIAGNOSIS — M6281 Muscle weakness (generalized): Secondary | ICD-10-CM | POA: Diagnosis not present

## 2023-08-12 ENCOUNTER — Other Ambulatory Visit: Payer: 59

## 2023-08-12 ENCOUNTER — Ambulatory Visit: Payer: 59 | Admitting: Oncology

## 2023-08-12 DIAGNOSIS — M6281 Muscle weakness (generalized): Secondary | ICD-10-CM | POA: Diagnosis not present

## 2023-08-12 DIAGNOSIS — R2681 Unsteadiness on feet: Secondary | ICD-10-CM | POA: Diagnosis not present

## 2023-08-12 DIAGNOSIS — I63441 Cerebral infarction due to embolism of right cerebellar artery: Secondary | ICD-10-CM | POA: Diagnosis not present

## 2023-08-13 DIAGNOSIS — M6281 Muscle weakness (generalized): Secondary | ICD-10-CM | POA: Diagnosis not present

## 2023-08-13 DIAGNOSIS — R2681 Unsteadiness on feet: Secondary | ICD-10-CM | POA: Diagnosis not present

## 2023-08-13 DIAGNOSIS — I63441 Cerebral infarction due to embolism of right cerebellar artery: Secondary | ICD-10-CM | POA: Diagnosis not present

## 2023-08-14 DIAGNOSIS — M6281 Muscle weakness (generalized): Secondary | ICD-10-CM | POA: Diagnosis not present

## 2023-08-14 DIAGNOSIS — I63441 Cerebral infarction due to embolism of right cerebellar artery: Secondary | ICD-10-CM | POA: Diagnosis not present

## 2023-08-14 DIAGNOSIS — R2681 Unsteadiness on feet: Secondary | ICD-10-CM | POA: Diagnosis not present

## 2023-08-16 DIAGNOSIS — M6281 Muscle weakness (generalized): Secondary | ICD-10-CM | POA: Diagnosis not present

## 2023-08-16 DIAGNOSIS — I63441 Cerebral infarction due to embolism of right cerebellar artery: Secondary | ICD-10-CM | POA: Diagnosis not present

## 2023-08-16 DIAGNOSIS — R2681 Unsteadiness on feet: Secondary | ICD-10-CM | POA: Diagnosis not present

## 2023-08-17 DIAGNOSIS — I63441 Cerebral infarction due to embolism of right cerebellar artery: Secondary | ICD-10-CM | POA: Diagnosis not present

## 2023-08-17 DIAGNOSIS — R2681 Unsteadiness on feet: Secondary | ICD-10-CM | POA: Diagnosis not present

## 2023-08-17 DIAGNOSIS — M6281 Muscle weakness (generalized): Secondary | ICD-10-CM | POA: Diagnosis not present

## 2023-08-18 DIAGNOSIS — R2681 Unsteadiness on feet: Secondary | ICD-10-CM | POA: Diagnosis not present

## 2023-08-18 DIAGNOSIS — I63441 Cerebral infarction due to embolism of right cerebellar artery: Secondary | ICD-10-CM | POA: Diagnosis not present

## 2023-08-18 DIAGNOSIS — M6281 Muscle weakness (generalized): Secondary | ICD-10-CM | POA: Diagnosis not present

## 2023-08-18 NOTE — Telephone Encounter (Signed)
Pt unable to see Dr Lorin Picket because he is followed by SNF MD. Patient is aware and says that when he gets home he does not want to lose Dr Lorin Picket as a doctor so he made the virtual so he could come back and see her. Pt is aware that appt has been cancelled.

## 2023-08-19 DIAGNOSIS — M6281 Muscle weakness (generalized): Secondary | ICD-10-CM | POA: Diagnosis not present

## 2023-08-19 DIAGNOSIS — R2681 Unsteadiness on feet: Secondary | ICD-10-CM | POA: Diagnosis not present

## 2023-08-19 DIAGNOSIS — I63441 Cerebral infarction due to embolism of right cerebellar artery: Secondary | ICD-10-CM | POA: Diagnosis not present

## 2023-08-20 DIAGNOSIS — I63441 Cerebral infarction due to embolism of right cerebellar artery: Secondary | ICD-10-CM | POA: Diagnosis not present

## 2023-08-20 DIAGNOSIS — M6281 Muscle weakness (generalized): Secondary | ICD-10-CM | POA: Diagnosis not present

## 2023-08-20 DIAGNOSIS — R2681 Unsteadiness on feet: Secondary | ICD-10-CM | POA: Diagnosis not present

## 2023-08-23 DIAGNOSIS — R2681 Unsteadiness on feet: Secondary | ICD-10-CM | POA: Diagnosis not present

## 2023-08-23 DIAGNOSIS — M6281 Muscle weakness (generalized): Secondary | ICD-10-CM | POA: Diagnosis not present

## 2023-08-23 DIAGNOSIS — I63441 Cerebral infarction due to embolism of right cerebellar artery: Secondary | ICD-10-CM | POA: Diagnosis not present

## 2023-08-24 DIAGNOSIS — R2681 Unsteadiness on feet: Secondary | ICD-10-CM | POA: Diagnosis not present

## 2023-08-24 DIAGNOSIS — M6281 Muscle weakness (generalized): Secondary | ICD-10-CM | POA: Diagnosis not present

## 2023-08-24 DIAGNOSIS — I63441 Cerebral infarction due to embolism of right cerebellar artery: Secondary | ICD-10-CM | POA: Diagnosis not present

## 2023-08-25 DIAGNOSIS — M6281 Muscle weakness (generalized): Secondary | ICD-10-CM | POA: Diagnosis not present

## 2023-08-25 DIAGNOSIS — R2681 Unsteadiness on feet: Secondary | ICD-10-CM | POA: Diagnosis not present

## 2023-08-25 DIAGNOSIS — I63441 Cerebral infarction due to embolism of right cerebellar artery: Secondary | ICD-10-CM | POA: Diagnosis not present

## 2023-08-26 DIAGNOSIS — R2681 Unsteadiness on feet: Secondary | ICD-10-CM | POA: Diagnosis not present

## 2023-08-26 DIAGNOSIS — M6281 Muscle weakness (generalized): Secondary | ICD-10-CM | POA: Diagnosis not present

## 2023-08-26 DIAGNOSIS — I63441 Cerebral infarction due to embolism of right cerebellar artery: Secondary | ICD-10-CM | POA: Diagnosis not present

## 2023-08-27 DIAGNOSIS — E109 Type 1 diabetes mellitus without complications: Secondary | ICD-10-CM | POA: Diagnosis not present

## 2023-08-27 DIAGNOSIS — R2681 Unsteadiness on feet: Secondary | ICD-10-CM | POA: Diagnosis not present

## 2023-08-27 DIAGNOSIS — I5022 Chronic systolic (congestive) heart failure: Secondary | ICD-10-CM | POA: Diagnosis not present

## 2023-08-27 DIAGNOSIS — M6281 Muscle weakness (generalized): Secondary | ICD-10-CM | POA: Diagnosis not present

## 2023-08-27 DIAGNOSIS — C8310 Mantle cell lymphoma, unspecified site: Secondary | ICD-10-CM | POA: Diagnosis not present

## 2023-08-27 DIAGNOSIS — Z23 Encounter for immunization: Secondary | ICD-10-CM | POA: Diagnosis not present

## 2023-08-27 DIAGNOSIS — I63441 Cerebral infarction due to embolism of right cerebellar artery: Secondary | ICD-10-CM | POA: Diagnosis not present

## 2023-08-30 DIAGNOSIS — M6281 Muscle weakness (generalized): Secondary | ICD-10-CM | POA: Diagnosis not present

## 2023-08-30 DIAGNOSIS — I63441 Cerebral infarction due to embolism of right cerebellar artery: Secondary | ICD-10-CM | POA: Diagnosis not present

## 2023-08-30 DIAGNOSIS — R2681 Unsteadiness on feet: Secondary | ICD-10-CM | POA: Diagnosis not present

## 2023-08-31 DIAGNOSIS — R2681 Unsteadiness on feet: Secondary | ICD-10-CM | POA: Diagnosis not present

## 2023-08-31 DIAGNOSIS — M6281 Muscle weakness (generalized): Secondary | ICD-10-CM | POA: Diagnosis not present

## 2023-08-31 DIAGNOSIS — I63441 Cerebral infarction due to embolism of right cerebellar artery: Secondary | ICD-10-CM | POA: Diagnosis not present

## 2023-09-01 DIAGNOSIS — I63441 Cerebral infarction due to embolism of right cerebellar artery: Secondary | ICD-10-CM | POA: Diagnosis not present

## 2023-09-01 DIAGNOSIS — R2681 Unsteadiness on feet: Secondary | ICD-10-CM | POA: Diagnosis not present

## 2023-09-01 DIAGNOSIS — M6281 Muscle weakness (generalized): Secondary | ICD-10-CM | POA: Diagnosis not present

## 2023-09-02 DIAGNOSIS — M6281 Muscle weakness (generalized): Secondary | ICD-10-CM | POA: Diagnosis not present

## 2023-09-02 DIAGNOSIS — R2681 Unsteadiness on feet: Secondary | ICD-10-CM | POA: Diagnosis not present

## 2023-09-02 DIAGNOSIS — I63441 Cerebral infarction due to embolism of right cerebellar artery: Secondary | ICD-10-CM | POA: Diagnosis not present

## 2023-09-03 DIAGNOSIS — R2681 Unsteadiness on feet: Secondary | ICD-10-CM | POA: Diagnosis not present

## 2023-09-03 DIAGNOSIS — I63441 Cerebral infarction due to embolism of right cerebellar artery: Secondary | ICD-10-CM | POA: Diagnosis not present

## 2023-09-03 DIAGNOSIS — M6281 Muscle weakness (generalized): Secondary | ICD-10-CM | POA: Diagnosis not present

## 2023-09-04 DIAGNOSIS — I63441 Cerebral infarction due to embolism of right cerebellar artery: Secondary | ICD-10-CM | POA: Diagnosis not present

## 2023-09-04 DIAGNOSIS — R2681 Unsteadiness on feet: Secondary | ICD-10-CM | POA: Diagnosis not present

## 2023-09-04 DIAGNOSIS — M6281 Muscle weakness (generalized): Secondary | ICD-10-CM | POA: Diagnosis not present

## 2023-09-06 DIAGNOSIS — R2681 Unsteadiness on feet: Secondary | ICD-10-CM | POA: Diagnosis not present

## 2023-09-06 DIAGNOSIS — M6281 Muscle weakness (generalized): Secondary | ICD-10-CM | POA: Diagnosis not present

## 2023-09-06 DIAGNOSIS — I63441 Cerebral infarction due to embolism of right cerebellar artery: Secondary | ICD-10-CM | POA: Diagnosis not present

## 2023-09-07 DIAGNOSIS — I63441 Cerebral infarction due to embolism of right cerebellar artery: Secondary | ICD-10-CM | POA: Diagnosis not present

## 2023-09-07 DIAGNOSIS — M6281 Muscle weakness (generalized): Secondary | ICD-10-CM | POA: Diagnosis not present

## 2023-09-07 DIAGNOSIS — R2681 Unsteadiness on feet: Secondary | ICD-10-CM | POA: Diagnosis not present

## 2023-09-08 DIAGNOSIS — I63441 Cerebral infarction due to embolism of right cerebellar artery: Secondary | ICD-10-CM | POA: Diagnosis not present

## 2023-09-08 DIAGNOSIS — R2681 Unsteadiness on feet: Secondary | ICD-10-CM | POA: Diagnosis not present

## 2023-09-08 DIAGNOSIS — M6281 Muscle weakness (generalized): Secondary | ICD-10-CM | POA: Diagnosis not present

## 2023-09-10 DIAGNOSIS — M6281 Muscle weakness (generalized): Secondary | ICD-10-CM | POA: Diagnosis not present

## 2023-09-10 DIAGNOSIS — R2681 Unsteadiness on feet: Secondary | ICD-10-CM | POA: Diagnosis not present

## 2023-09-10 DIAGNOSIS — I63441 Cerebral infarction due to embolism of right cerebellar artery: Secondary | ICD-10-CM | POA: Diagnosis not present

## 2023-09-13 DIAGNOSIS — K5900 Constipation, unspecified: Secondary | ICD-10-CM | POA: Diagnosis not present

## 2023-09-13 DIAGNOSIS — C8310 Mantle cell lymphoma, unspecified site: Secondary | ICD-10-CM | POA: Diagnosis not present

## 2023-09-13 DIAGNOSIS — E109 Type 1 diabetes mellitus without complications: Secondary | ICD-10-CM | POA: Diagnosis not present

## 2023-09-13 DIAGNOSIS — R2681 Unsteadiness on feet: Secondary | ICD-10-CM | POA: Diagnosis not present

## 2023-09-13 DIAGNOSIS — I5022 Chronic systolic (congestive) heart failure: Secondary | ICD-10-CM | POA: Diagnosis not present

## 2023-09-13 DIAGNOSIS — I63441 Cerebral infarction due to embolism of right cerebellar artery: Secondary | ICD-10-CM | POA: Diagnosis not present

## 2023-09-13 DIAGNOSIS — M6281 Muscle weakness (generalized): Secondary | ICD-10-CM | POA: Diagnosis not present

## 2023-09-14 DIAGNOSIS — E785 Hyperlipidemia, unspecified: Secondary | ICD-10-CM | POA: Diagnosis not present

## 2023-09-15 DIAGNOSIS — E109 Type 1 diabetes mellitus without complications: Secondary | ICD-10-CM | POA: Diagnosis not present

## 2023-09-15 DIAGNOSIS — I63441 Cerebral infarction due to embolism of right cerebellar artery: Secondary | ICD-10-CM | POA: Diagnosis not present

## 2023-09-15 DIAGNOSIS — M6281 Muscle weakness (generalized): Secondary | ICD-10-CM | POA: Diagnosis not present

## 2023-09-15 DIAGNOSIS — R2681 Unsteadiness on feet: Secondary | ICD-10-CM | POA: Diagnosis not present

## 2023-09-15 DIAGNOSIS — I5022 Chronic systolic (congestive) heart failure: Secondary | ICD-10-CM | POA: Diagnosis not present

## 2023-09-15 DIAGNOSIS — C8310 Mantle cell lymphoma, unspecified site: Secondary | ICD-10-CM | POA: Diagnosis not present

## 2023-09-15 DIAGNOSIS — I639 Cerebral infarction, unspecified: Secondary | ICD-10-CM | POA: Diagnosis not present

## 2023-09-16 ENCOUNTER — Telehealth: Payer: 59 | Admitting: Internal Medicine

## 2023-09-17 DIAGNOSIS — I63441 Cerebral infarction due to embolism of right cerebellar artery: Secondary | ICD-10-CM | POA: Diagnosis not present

## 2023-09-17 DIAGNOSIS — M6281 Muscle weakness (generalized): Secondary | ICD-10-CM | POA: Diagnosis not present

## 2023-09-17 DIAGNOSIS — R2681 Unsteadiness on feet: Secondary | ICD-10-CM | POA: Diagnosis not present

## 2023-09-20 DIAGNOSIS — M6281 Muscle weakness (generalized): Secondary | ICD-10-CM | POA: Diagnosis not present

## 2023-09-20 DIAGNOSIS — R2681 Unsteadiness on feet: Secondary | ICD-10-CM | POA: Diagnosis not present

## 2023-09-20 DIAGNOSIS — I63441 Cerebral infarction due to embolism of right cerebellar artery: Secondary | ICD-10-CM | POA: Diagnosis not present

## 2023-09-21 ENCOUNTER — Encounter: Payer: Self-pay | Admitting: Internal Medicine

## 2023-09-21 DIAGNOSIS — E109 Type 1 diabetes mellitus without complications: Secondary | ICD-10-CM | POA: Diagnosis not present

## 2023-09-21 DIAGNOSIS — E785 Hyperlipidemia, unspecified: Secondary | ICD-10-CM | POA: Diagnosis not present

## 2023-09-21 DIAGNOSIS — I5022 Chronic systolic (congestive) heart failure: Secondary | ICD-10-CM | POA: Diagnosis not present

## 2023-09-21 DIAGNOSIS — I639 Cerebral infarction, unspecified: Secondary | ICD-10-CM | POA: Diagnosis not present

## 2023-09-22 DIAGNOSIS — R2681 Unsteadiness on feet: Secondary | ICD-10-CM | POA: Diagnosis not present

## 2023-09-22 DIAGNOSIS — M6281 Muscle weakness (generalized): Secondary | ICD-10-CM | POA: Diagnosis not present

## 2023-09-22 DIAGNOSIS — F32A Depression, unspecified: Secondary | ICD-10-CM | POA: Diagnosis not present

## 2023-09-22 DIAGNOSIS — I63441 Cerebral infarction due to embolism of right cerebellar artery: Secondary | ICD-10-CM | POA: Diagnosis not present

## 2023-09-24 DIAGNOSIS — I63441 Cerebral infarction due to embolism of right cerebellar artery: Secondary | ICD-10-CM | POA: Diagnosis not present

## 2023-09-24 DIAGNOSIS — R2681 Unsteadiness on feet: Secondary | ICD-10-CM | POA: Diagnosis not present

## 2023-09-24 DIAGNOSIS — M6281 Muscle weakness (generalized): Secondary | ICD-10-CM | POA: Diagnosis not present

## 2023-09-25 ENCOUNTER — Encounter: Payer: Self-pay | Admitting: Internal Medicine

## 2023-09-28 DIAGNOSIS — I639 Cerebral infarction, unspecified: Secondary | ICD-10-CM | POA: Diagnosis not present

## 2023-09-28 DIAGNOSIS — E109 Type 1 diabetes mellitus without complications: Secondary | ICD-10-CM | POA: Diagnosis not present

## 2023-09-28 DIAGNOSIS — C8310 Mantle cell lymphoma, unspecified site: Secondary | ICD-10-CM | POA: Diagnosis not present

## 2023-09-28 DIAGNOSIS — I5022 Chronic systolic (congestive) heart failure: Secondary | ICD-10-CM | POA: Diagnosis not present

## 2023-09-28 NOTE — Telephone Encounter (Signed)
Gary [name], need to clarify - my understanding, he is now living in a facility and being followed by primary care there. I think they have been following and adjusting his medication. Has something changed?

## 2023-09-28 NOTE — Telephone Encounter (Signed)
See other note

## 2023-09-29 DIAGNOSIS — I5022 Chronic systolic (congestive) heart failure: Secondary | ICD-10-CM | POA: Diagnosis not present

## 2023-09-29 DIAGNOSIS — I639 Cerebral infarction, unspecified: Secondary | ICD-10-CM | POA: Diagnosis not present

## 2023-09-29 DIAGNOSIS — K30 Functional dyspepsia: Secondary | ICD-10-CM | POA: Diagnosis not present

## 2023-09-29 DIAGNOSIS — E109 Type 1 diabetes mellitus without complications: Secondary | ICD-10-CM | POA: Diagnosis not present

## 2023-09-30 DIAGNOSIS — I5022 Chronic systolic (congestive) heart failure: Secondary | ICD-10-CM | POA: Diagnosis not present

## 2023-09-30 DIAGNOSIS — F32A Depression, unspecified: Secondary | ICD-10-CM | POA: Diagnosis not present

## 2023-09-30 DIAGNOSIS — I639 Cerebral infarction, unspecified: Secondary | ICD-10-CM | POA: Diagnosis not present

## 2023-09-30 DIAGNOSIS — E559 Vitamin D deficiency, unspecified: Secondary | ICD-10-CM | POA: Diagnosis not present

## 2023-09-30 DIAGNOSIS — E039 Hypothyroidism, unspecified: Secondary | ICD-10-CM | POA: Diagnosis not present

## 2023-09-30 DIAGNOSIS — K219 Gastro-esophageal reflux disease without esophagitis: Secondary | ICD-10-CM | POA: Diagnosis not present

## 2023-09-30 DIAGNOSIS — E109 Type 1 diabetes mellitus without complications: Secondary | ICD-10-CM | POA: Diagnosis not present

## 2023-09-30 DIAGNOSIS — I951 Orthostatic hypotension: Secondary | ICD-10-CM | POA: Diagnosis not present

## 2023-09-30 DIAGNOSIS — D519 Vitamin B12 deficiency anemia, unspecified: Secondary | ICD-10-CM | POA: Diagnosis not present

## 2023-09-30 NOTE — Telephone Encounter (Signed)
Spoke with patient, he is moving to [facility]. He asked that I contact his sister to discuss the move and equipment needs.

## 2023-09-30 NOTE — Telephone Encounter (Signed)
Called and spoke with patient and sister. He is moving out of the facility he is in to another facility. He is going to [facility]. You will be following him. He is has a wheel chair now and sister thinks she has a hospital bed. They are checking with therapy to see about a walker. Just wanted you to be aware of what is going on. Sister is supposed to let me know if we need to do anything.

## 2023-10-04 ENCOUNTER — Telehealth: Payer: Self-pay

## 2023-10-04 NOTE — Telephone Encounter (Signed)
Copied from CRM [id number]. Topic: Appointments - Appointment Scheduling >> Oct 04, 2023  4:03 PM Turkey A wrote: Patient's sister [name]) called to schedule appt for patient because he was released [facility]. He is now at [facility].

## 2023-10-05 NOTE — Telephone Encounter (Signed)
Appt moved to 1/16 at 11

## 2023-10-19 ENCOUNTER — Telehealth: Payer: Self-pay

## 2023-10-19 NOTE — Telephone Encounter (Signed)
 Death certificate certified. Please mark as deceased.

## 2023-10-19 NOTE — Telephone Encounter (Signed)
 Received notification to complete death certificate.   Pt was found in his apartment on 2023/10/28. [employer] [facility] responded to scene.   Date of death: 10/28/23 TOD: 0903 Per patrol supervisor with [employer], EMS pronounced body and medical examiner was contacted after failed attempt to reach PCP office. PCP info provided by sister. Medical examiner released the body to [facility] and family. Body was picked up at 1053.   Patrol supervisor stated that he was found by the staff at [facility] in his wheel chair with vomit on his clothes sitting in his apartment. Sister called [facility] staff to check on him because she was unable to reach him on the phone.

## 2023-10-19 NOTE — Telephone Encounter (Signed)
 Copied from CRM [id number]. Topic: General - Deceased Patient >> 2023/11/09  3:26 PM Farrel B wrote: Name of caller: Ricka   Date of death:    Name of funeral home: [facility]  Phone number of funeral home: [phone]  Provider that needs to sign form: Allena Hamilton  Timeline for signing: Ms. Ricka stated the information was faxed over to have signed and has not received anything from the providers offices as of yet.  Please contact Ms. Ricka as per her request.

## 2023-10-20 NOTE — Telephone Encounter (Signed)
Patient has been marked deceased.

## 2023-10-22 ENCOUNTER — Ambulatory Visit: Payer: 59 | Admitting: Internal Medicine

## 2023-10-28 ENCOUNTER — Ambulatory Visit: Payer: 59 | Admitting: Internal Medicine

## 2023-10-29 ENCOUNTER — Other Ambulatory Visit: Payer: 59

## 2023-10-29 ENCOUNTER — Ambulatory Visit: Payer: 59

## 2023-11-05 ENCOUNTER — Ambulatory Visit: Payer: 59 | Admitting: Oncology

## 2023-11-13 DEATH — deceased
